# Patient Record
Sex: Male | Born: 1945 | Race: White | Hispanic: No | Smoking: Never smoker
Health system: Southern US, Community
[De-identification: ages and names within clinical notes are randomized; demographics above are authoritative.]

## PROBLEM LIST (undated history)

## (undated) DIAGNOSIS — Z9289 Personal history of other medical treatment: Secondary | ICD-10-CM

## (undated) DIAGNOSIS — E039 Hypothyroidism, unspecified: Secondary | ICD-10-CM

## (undated) DIAGNOSIS — E119 Type 2 diabetes mellitus without complications: Secondary | ICD-10-CM

## (undated) DIAGNOSIS — E7800 Pure hypercholesterolemia, unspecified: Secondary | ICD-10-CM

## (undated) DIAGNOSIS — I639 Cerebral infarction, unspecified: Secondary | ICD-10-CM

## (undated) DIAGNOSIS — I82409 Acute embolism and thrombosis of unspecified deep veins of unspecified lower extremity: Secondary | ICD-10-CM

## (undated) DIAGNOSIS — Z87448 Personal history of other diseases of urinary system: Secondary | ICD-10-CM

## (undated) DIAGNOSIS — E785 Hyperlipidemia, unspecified: Secondary | ICD-10-CM

## (undated) DIAGNOSIS — I10 Essential (primary) hypertension: Secondary | ICD-10-CM

## (undated) DIAGNOSIS — C801 Malignant (primary) neoplasm, unspecified: Secondary | ICD-10-CM

## (undated) DIAGNOSIS — Z8669 Personal history of other diseases of the nervous system and sense organs: Secondary | ICD-10-CM

## (undated) DIAGNOSIS — E079 Disorder of thyroid, unspecified: Secondary | ICD-10-CM

## (undated) DIAGNOSIS — G8929 Other chronic pain: Secondary | ICD-10-CM

## (undated) DIAGNOSIS — B20 Human immunodeficiency virus [HIV] disease: Secondary | ICD-10-CM

## (undated) DIAGNOSIS — A0472 Enterocolitis due to Clostridium difficile, not specified as recurrent: Secondary | ICD-10-CM

## (undated) DIAGNOSIS — Z973 Presence of spectacles and contact lenses: Secondary | ICD-10-CM

## (undated) DIAGNOSIS — K219 Gastro-esophageal reflux disease without esophagitis: Secondary | ICD-10-CM

## (undated) DIAGNOSIS — N189 Chronic kidney disease, unspecified: Secondary | ICD-10-CM

## (undated) DIAGNOSIS — Z21 Asymptomatic human immunodeficiency virus [HIV] infection status: Secondary | ICD-10-CM

## (undated) DIAGNOSIS — F419 Anxiety disorder, unspecified: Secondary | ICD-10-CM

## (undated) HISTORY — DX: Chronic kidney disease, unspecified: N18.9

## (undated) HISTORY — DX: Anxiety disorder, unspecified: F41.9

## (undated) HISTORY — PX: ABDOMINAL SURGERY: SHX537

## (undated) HISTORY — DX: Asymptomatic human immunodeficiency virus (hiv) infection status: Z21

## (undated) HISTORY — DX: Human immunodeficiency virus (HIV) disease: B20

## (undated) HISTORY — PX: HX CERVICAL SPINE SURGERY: 2100001197

## (undated) HISTORY — PX: COLON SURGERY: SHX602

## (undated) HISTORY — PX: KNEE SURGERY: SHX244

## (undated) HISTORY — PX: PANCREATECTOMY: SHX1019

## (undated) HISTORY — PX: WRIST SURGERY: SHX841

## (undated) HISTORY — DX: Gastro-esophageal reflux disease without esophagitis: K21.9

## (undated) HISTORY — PX: SPLENECTOMY: SUR1306

## (undated) HISTORY — PX: COLONOSCOPY: WVUENDOPRO10

## (undated) HISTORY — DX: Hypothyroidism, unspecified: E03.9

## (undated) HISTORY — PX: HX COLOSTOMY REVERSAL: 2100001134

## (undated) HISTORY — PX: HX LAPAROTOMY: SHX154

## (undated) HISTORY — DX: Essential (primary) hypertension: I10

## (undated) HISTORY — PX: GASTROSCOPY: WVUENDOPRO49

## (undated) HISTORY — DX: Pure hypercholesterolemia, unspecified: E78.00

## (undated) HISTORY — PX: HX HERNIA REPAIR: SHX51

---

## 1999-01-11 ENCOUNTER — Encounter: Admission: RE | Admit: 1999-01-11 | Discharge: 1999-01-11 | Payer: Self-pay | Admitting: Infectious Diseases

## 1999-01-11 ENCOUNTER — Encounter (INDEPENDENT_AMBULATORY_CARE_PROVIDER_SITE_OTHER): Payer: Self-pay | Admitting: *Deleted

## 1999-01-11 ENCOUNTER — Ambulatory Visit (HOSPITAL_COMMUNITY): Admission: RE | Admit: 1999-01-11 | Discharge: 1999-01-11 | Payer: Self-pay | Admitting: Infectious Diseases

## 1999-01-11 LAB — CONVERTED CEMR LAB
CD4 Count: 480 microliters
CD4 T Cell Abs: 480

## 1999-01-20 ENCOUNTER — Encounter: Admission: RE | Admit: 1999-01-20 | Discharge: 1999-01-20 | Payer: Self-pay | Admitting: Infectious Diseases

## 1999-02-01 HISTORY — PX: HX CERVICAL SPINE SURGERY: 2100001197

## 1999-02-17 ENCOUNTER — Ambulatory Visit (HOSPITAL_COMMUNITY): Admission: RE | Admit: 1999-02-17 | Discharge: 1999-02-17 | Payer: Self-pay | Admitting: Infectious Diseases

## 1999-02-17 ENCOUNTER — Encounter: Admission: RE | Admit: 1999-02-17 | Discharge: 1999-02-17 | Payer: Self-pay | Admitting: Infectious Diseases

## 1999-03-05 ENCOUNTER — Encounter: Admission: RE | Admit: 1999-03-05 | Discharge: 1999-03-05 | Payer: Self-pay | Admitting: Infectious Diseases

## 1999-05-27 ENCOUNTER — Encounter: Admission: RE | Admit: 1999-05-27 | Discharge: 1999-05-27 | Payer: Self-pay | Admitting: Infectious Diseases

## 1999-05-27 ENCOUNTER — Ambulatory Visit (HOSPITAL_COMMUNITY): Admission: RE | Admit: 1999-05-27 | Discharge: 1999-05-27 | Payer: Self-pay | Admitting: Infectious Diseases

## 1999-06-17 ENCOUNTER — Encounter: Admission: RE | Admit: 1999-06-17 | Discharge: 1999-06-17 | Payer: Self-pay | Admitting: Infectious Diseases

## 1999-11-22 ENCOUNTER — Encounter: Admission: RE | Admit: 1999-11-22 | Discharge: 1999-11-22 | Payer: Self-pay | Admitting: Internal Medicine

## 1999-11-22 ENCOUNTER — Ambulatory Visit (HOSPITAL_COMMUNITY): Admission: RE | Admit: 1999-11-22 | Discharge: 1999-11-22 | Payer: Self-pay | Admitting: Internal Medicine

## 1999-12-02 DIAGNOSIS — B20 Human immunodeficiency virus [HIV] disease: Secondary | ICD-10-CM

## 1999-12-09 ENCOUNTER — Encounter: Admission: RE | Admit: 1999-12-09 | Discharge: 1999-12-09 | Payer: Self-pay | Admitting: Infectious Diseases

## 2000-01-01 DIAGNOSIS — K5732 Diverticulitis of large intestine without perforation or abscess without bleeding: Secondary | ICD-10-CM | POA: Insufficient documentation

## 2000-07-06 ENCOUNTER — Ambulatory Visit (HOSPITAL_COMMUNITY): Admission: RE | Admit: 2000-07-06 | Discharge: 2000-07-06 | Payer: Self-pay | Admitting: Infectious Diseases

## 2000-07-06 ENCOUNTER — Encounter: Admission: RE | Admit: 2000-07-06 | Discharge: 2000-07-06 | Payer: Self-pay | Admitting: Infectious Diseases

## 2000-07-20 ENCOUNTER — Encounter: Admission: RE | Admit: 2000-07-20 | Discharge: 2000-07-20 | Payer: Self-pay | Admitting: Infectious Diseases

## 2000-12-11 ENCOUNTER — Encounter: Admission: RE | Admit: 2000-12-11 | Discharge: 2000-12-11 | Payer: Self-pay | Admitting: Vascular Surgery

## 2000-12-11 ENCOUNTER — Encounter: Payer: Self-pay | Admitting: Vascular Surgery

## 2001-01-18 ENCOUNTER — Encounter: Admission: RE | Admit: 2001-01-18 | Discharge: 2001-01-18 | Payer: Self-pay | Admitting: Infectious Diseases

## 2001-01-18 ENCOUNTER — Ambulatory Visit (HOSPITAL_COMMUNITY): Admission: RE | Admit: 2001-01-18 | Discharge: 2001-01-18 | Payer: Self-pay | Admitting: Infectious Diseases

## 2001-02-01 ENCOUNTER — Encounter: Admission: RE | Admit: 2001-02-01 | Discharge: 2001-02-01 | Payer: Self-pay | Admitting: Infectious Diseases

## 2001-02-19 ENCOUNTER — Encounter: Admission: RE | Admit: 2001-02-19 | Discharge: 2001-02-19 | Payer: Self-pay | Admitting: Infectious Diseases

## 2001-03-05 ENCOUNTER — Encounter: Admission: RE | Admit: 2001-03-05 | Discharge: 2001-03-05 | Payer: Self-pay | Admitting: Infectious Diseases

## 2001-06-26 ENCOUNTER — Ambulatory Visit (HOSPITAL_COMMUNITY): Admission: RE | Admit: 2001-06-26 | Discharge: 2001-06-26 | Payer: Self-pay | Admitting: Infectious Diseases

## 2001-06-26 ENCOUNTER — Encounter: Admission: RE | Admit: 2001-06-26 | Discharge: 2001-06-26 | Payer: Self-pay | Admitting: Internal Medicine

## 2001-07-12 ENCOUNTER — Encounter: Admission: RE | Admit: 2001-07-12 | Discharge: 2001-07-12 | Payer: Self-pay | Admitting: Infectious Diseases

## 2001-08-07 ENCOUNTER — Inpatient Hospital Stay (HOSPITAL_COMMUNITY): Admission: EM | Admit: 2001-08-07 | Discharge: 2001-08-09 | Payer: Self-pay | Admitting: Emergency Medicine

## 2001-11-22 ENCOUNTER — Encounter: Admission: RE | Admit: 2001-11-22 | Discharge: 2001-11-22 | Payer: Self-pay | Admitting: Infectious Diseases

## 2001-11-22 ENCOUNTER — Ambulatory Visit (HOSPITAL_COMMUNITY): Admission: RE | Admit: 2001-11-22 | Discharge: 2001-11-22 | Payer: Self-pay | Admitting: Infectious Diseases

## 2001-12-06 ENCOUNTER — Encounter: Admission: RE | Admit: 2001-12-06 | Discharge: 2001-12-06 | Payer: Self-pay | Admitting: Infectious Diseases

## 2002-06-13 ENCOUNTER — Encounter: Admission: RE | Admit: 2002-06-13 | Discharge: 2002-06-13 | Payer: Self-pay | Admitting: Infectious Diseases

## 2002-06-13 ENCOUNTER — Encounter: Payer: Self-pay | Admitting: Infectious Diseases

## 2002-06-17 ENCOUNTER — Encounter: Admission: RE | Admit: 2002-06-17 | Discharge: 2002-06-17 | Payer: Self-pay | Admitting: Infectious Diseases

## 2002-06-28 ENCOUNTER — Encounter: Payer: Self-pay | Admitting: Infectious Diseases

## 2002-06-28 ENCOUNTER — Ambulatory Visit (HOSPITAL_COMMUNITY): Admission: RE | Admit: 2002-06-28 | Discharge: 2002-06-28 | Payer: Self-pay | Admitting: Infectious Diseases

## 2002-06-28 ENCOUNTER — Encounter: Admission: RE | Admit: 2002-06-28 | Discharge: 2002-06-28 | Payer: Self-pay | Admitting: Infectious Diseases

## 2002-07-05 ENCOUNTER — Emergency Department (HOSPITAL_COMMUNITY): Admission: EM | Admit: 2002-07-05 | Discharge: 2002-07-06 | Payer: Self-pay | Admitting: Emergency Medicine

## 2002-07-15 ENCOUNTER — Encounter: Admission: RE | Admit: 2002-07-15 | Discharge: 2002-07-15 | Payer: Self-pay | Admitting: Infectious Diseases

## 2002-07-18 ENCOUNTER — Encounter: Admission: RE | Admit: 2002-07-18 | Discharge: 2002-07-18 | Payer: Self-pay | Admitting: Infectious Diseases

## 2002-08-06 ENCOUNTER — Encounter: Admission: RE | Admit: 2002-08-06 | Discharge: 2002-08-06 | Payer: Self-pay | Admitting: Infectious Diseases

## 2002-09-27 ENCOUNTER — Encounter: Admission: RE | Admit: 2002-09-27 | Discharge: 2002-09-27 | Payer: Self-pay | Admitting: Infectious Diseases

## 2002-11-08 ENCOUNTER — Encounter: Admission: RE | Admit: 2002-11-08 | Discharge: 2002-11-08 | Payer: Self-pay | Admitting: Infectious Diseases

## 2002-12-02 ENCOUNTER — Encounter: Admission: RE | Admit: 2002-12-02 | Discharge: 2002-12-02 | Payer: Self-pay | Admitting: Infectious Diseases

## 2002-12-02 ENCOUNTER — Ambulatory Visit (HOSPITAL_COMMUNITY): Admission: RE | Admit: 2002-12-02 | Discharge: 2002-12-02 | Payer: Self-pay | Admitting: Infectious Diseases

## 2002-12-02 ENCOUNTER — Encounter: Payer: Self-pay | Admitting: Infectious Diseases

## 2003-01-07 ENCOUNTER — Encounter: Admission: RE | Admit: 2003-01-07 | Discharge: 2003-01-07 | Payer: Self-pay | Admitting: Infectious Diseases

## 2003-01-07 ENCOUNTER — Encounter: Payer: Self-pay | Admitting: Infectious Diseases

## 2003-01-07 ENCOUNTER — Ambulatory Visit (HOSPITAL_COMMUNITY): Admission: RE | Admit: 2003-01-07 | Discharge: 2003-01-07 | Payer: Self-pay | Admitting: Infectious Diseases

## 2003-01-15 ENCOUNTER — Encounter: Admission: RE | Admit: 2003-01-15 | Discharge: 2003-01-15 | Payer: Self-pay | Admitting: Infectious Diseases

## 2003-02-07 ENCOUNTER — Encounter: Admission: RE | Admit: 2003-02-07 | Discharge: 2003-02-07 | Payer: Self-pay | Admitting: Infectious Diseases

## 2003-02-21 ENCOUNTER — Encounter: Admission: RE | Admit: 2003-02-21 | Discharge: 2003-02-21 | Payer: Self-pay | Admitting: Infectious Diseases

## 2003-04-10 ENCOUNTER — Ambulatory Visit (HOSPITAL_COMMUNITY): Admission: RE | Admit: 2003-04-10 | Discharge: 2003-04-10 | Payer: Self-pay | Admitting: Infectious Diseases

## 2003-04-10 ENCOUNTER — Encounter: Admission: RE | Admit: 2003-04-10 | Discharge: 2003-04-10 | Payer: Self-pay | Admitting: Infectious Diseases

## 2003-04-24 ENCOUNTER — Encounter: Admission: RE | Admit: 2003-04-24 | Discharge: 2003-04-24 | Payer: Self-pay | Admitting: Infectious Diseases

## 2003-04-30 ENCOUNTER — Ambulatory Visit (HOSPITAL_COMMUNITY): Admission: RE | Admit: 2003-04-30 | Discharge: 2003-04-30 | Payer: Self-pay | Admitting: Infectious Diseases

## 2003-05-27 ENCOUNTER — Encounter: Admission: RE | Admit: 2003-05-27 | Discharge: 2003-05-27 | Payer: Self-pay | Admitting: Infectious Diseases

## 2003-06-05 ENCOUNTER — Ambulatory Visit (HOSPITAL_COMMUNITY): Admission: RE | Admit: 2003-06-05 | Discharge: 2003-06-05 | Payer: Self-pay | Admitting: Infectious Diseases

## 2003-06-05 ENCOUNTER — Encounter: Admission: RE | Admit: 2003-06-05 | Discharge: 2003-06-05 | Payer: Self-pay | Admitting: Infectious Diseases

## 2003-06-19 ENCOUNTER — Encounter: Admission: RE | Admit: 2003-06-19 | Discharge: 2003-06-19 | Payer: Self-pay | Admitting: Infectious Diseases

## 2003-06-27 ENCOUNTER — Encounter: Admission: RE | Admit: 2003-06-27 | Discharge: 2003-06-27 | Payer: Self-pay | Admitting: Infectious Diseases

## 2003-06-27 ENCOUNTER — Ambulatory Visit (HOSPITAL_COMMUNITY): Admission: RE | Admit: 2003-06-27 | Discharge: 2003-06-27 | Payer: Self-pay | Admitting: Infectious Diseases

## 2003-08-12 ENCOUNTER — Ambulatory Visit (HOSPITAL_COMMUNITY): Admission: RE | Admit: 2003-08-12 | Discharge: 2003-08-12 | Payer: Self-pay | Admitting: Infectious Diseases

## 2003-08-12 ENCOUNTER — Encounter: Admission: RE | Admit: 2003-08-12 | Discharge: 2003-08-12 | Payer: Self-pay | Admitting: Infectious Diseases

## 2003-08-28 ENCOUNTER — Encounter: Admission: RE | Admit: 2003-08-28 | Discharge: 2003-08-28 | Payer: Self-pay | Admitting: Infectious Diseases

## 2003-09-01 DIAGNOSIS — F329 Major depressive disorder, single episode, unspecified: Secondary | ICD-10-CM

## 2003-09-24 ENCOUNTER — Encounter: Admission: RE | Admit: 2003-09-24 | Discharge: 2003-09-24 | Payer: Self-pay | Admitting: Infectious Diseases

## 2003-10-13 ENCOUNTER — Ambulatory Visit: Payer: Self-pay | Admitting: Infectious Diseases

## 2003-10-13 ENCOUNTER — Ambulatory Visit (HOSPITAL_COMMUNITY): Admission: RE | Admit: 2003-10-13 | Discharge: 2003-10-13 | Payer: Self-pay | Admitting: Infectious Diseases

## 2003-10-31 ENCOUNTER — Ambulatory Visit: Payer: Self-pay | Admitting: Infectious Diseases

## 2003-11-17 ENCOUNTER — Ambulatory Visit: Payer: Self-pay | Admitting: Infectious Diseases

## 2003-11-17 ENCOUNTER — Ambulatory Visit (HOSPITAL_COMMUNITY): Admission: RE | Admit: 2003-11-17 | Discharge: 2003-11-17 | Payer: Self-pay | Admitting: Infectious Diseases

## 2003-11-26 ENCOUNTER — Ambulatory Visit: Payer: Self-pay | Admitting: Infectious Diseases

## 2003-12-01 ENCOUNTER — Ambulatory Visit: Payer: Self-pay | Admitting: Infectious Diseases

## 2003-12-01 ENCOUNTER — Ambulatory Visit (HOSPITAL_COMMUNITY): Admission: RE | Admit: 2003-12-01 | Discharge: 2003-12-01 | Payer: Self-pay | Admitting: Infectious Diseases

## 2003-12-19 ENCOUNTER — Ambulatory Visit: Payer: Self-pay | Admitting: Infectious Diseases

## 2004-01-12 ENCOUNTER — Ambulatory Visit: Payer: Self-pay | Admitting: Infectious Diseases

## 2004-01-14 ENCOUNTER — Emergency Department (HOSPITAL_COMMUNITY): Admission: EM | Admit: 2004-01-14 | Discharge: 2004-01-14 | Payer: Self-pay | Admitting: Emergency Medicine

## 2004-01-23 ENCOUNTER — Ambulatory Visit: Payer: Self-pay | Admitting: Infectious Diseases

## 2004-03-20 ENCOUNTER — Ambulatory Visit: Payer: Self-pay | Admitting: Internal Medicine

## 2004-03-20 ENCOUNTER — Inpatient Hospital Stay (HOSPITAL_COMMUNITY): Admission: AD | Admit: 2004-03-20 | Discharge: 2004-04-17 | Payer: Self-pay | Admitting: Pulmonary Disease

## 2004-03-20 ENCOUNTER — Ambulatory Visit: Payer: Self-pay | Admitting: Pulmonary Disease

## 2004-03-31 ENCOUNTER — Ambulatory Visit: Payer: Self-pay | Admitting: Internal Medicine

## 2004-04-06 ENCOUNTER — Ambulatory Visit: Payer: Self-pay | Admitting: Infectious Diseases

## 2004-05-02 ENCOUNTER — Inpatient Hospital Stay (HOSPITAL_COMMUNITY): Admission: EM | Admit: 2004-05-02 | Discharge: 2004-05-10 | Payer: Self-pay | Admitting: Emergency Medicine

## 2004-05-02 ENCOUNTER — Ambulatory Visit: Payer: Self-pay | Admitting: Internal Medicine

## 2004-05-12 ENCOUNTER — Ambulatory Visit: Payer: Self-pay | Admitting: Infectious Diseases

## 2004-06-24 ENCOUNTER — Ambulatory Visit: Payer: Self-pay | Admitting: Infectious Diseases

## 2004-06-24 ENCOUNTER — Ambulatory Visit (HOSPITAL_COMMUNITY): Admission: RE | Admit: 2004-06-24 | Discharge: 2004-06-24 | Payer: Self-pay | Admitting: Infectious Diseases

## 2004-09-13 ENCOUNTER — Ambulatory Visit: Payer: Self-pay | Admitting: Infectious Diseases

## 2004-09-30 ENCOUNTER — Ambulatory Visit: Payer: Self-pay | Admitting: Infectious Diseases

## 2004-09-30 ENCOUNTER — Ambulatory Visit (HOSPITAL_COMMUNITY): Admission: RE | Admit: 2004-09-30 | Discharge: 2004-09-30 | Payer: Self-pay | Admitting: Infectious Diseases

## 2004-10-21 ENCOUNTER — Ambulatory Visit: Payer: Self-pay | Admitting: Infectious Diseases

## 2004-11-12 ENCOUNTER — Ambulatory Visit: Payer: Self-pay | Admitting: Infectious Diseases

## 2004-11-19 ENCOUNTER — Ambulatory Visit: Payer: Self-pay | Admitting: Internal Medicine

## 2004-12-03 ENCOUNTER — Ambulatory Visit: Payer: Self-pay | Admitting: Infectious Diseases

## 2004-12-17 ENCOUNTER — Ambulatory Visit: Payer: Self-pay | Admitting: Infectious Diseases

## 2004-12-31 ENCOUNTER — Ambulatory Visit: Payer: Self-pay | Admitting: Infectious Diseases

## 2005-01-11 ENCOUNTER — Ambulatory Visit (HOSPITAL_COMMUNITY): Admission: RE | Admit: 2005-01-11 | Discharge: 2005-01-11 | Payer: Self-pay | Admitting: Infectious Diseases

## 2005-01-11 ENCOUNTER — Ambulatory Visit: Payer: Self-pay | Admitting: Infectious Diseases

## 2005-01-19 ENCOUNTER — Ambulatory Visit: Payer: Self-pay | Admitting: Infectious Diseases

## 2005-03-04 ENCOUNTER — Encounter (INDEPENDENT_AMBULATORY_CARE_PROVIDER_SITE_OTHER): Payer: Self-pay | Admitting: *Deleted

## 2005-03-04 ENCOUNTER — Ambulatory Visit: Payer: Self-pay | Admitting: Infectious Diseases

## 2005-03-04 LAB — CONVERTED CEMR LAB: CD4 Count: 750 microliters

## 2005-03-18 ENCOUNTER — Ambulatory Visit: Payer: Self-pay | Admitting: Infectious Diseases

## 2005-03-24 ENCOUNTER — Ambulatory Visit: Payer: Self-pay | Admitting: Infectious Diseases

## 2005-04-07 ENCOUNTER — Ambulatory Visit: Payer: Self-pay | Admitting: Infectious Diseases

## 2005-04-15 ENCOUNTER — Ambulatory Visit: Payer: Self-pay | Admitting: Infectious Diseases

## 2005-04-25 ENCOUNTER — Encounter: Admission: RE | Admit: 2005-04-25 | Discharge: 2005-04-25 | Payer: Self-pay | Admitting: Infectious Diseases

## 2005-04-25 ENCOUNTER — Encounter (INDEPENDENT_AMBULATORY_CARE_PROVIDER_SITE_OTHER): Payer: Self-pay | Admitting: *Deleted

## 2005-04-25 ENCOUNTER — Ambulatory Visit: Payer: Self-pay | Admitting: Infectious Diseases

## 2005-04-26 ENCOUNTER — Encounter: Payer: Self-pay | Admitting: Infectious Diseases

## 2005-05-05 ENCOUNTER — Ambulatory Visit: Payer: Self-pay | Admitting: Infectious Diseases

## 2006-02-14 DIAGNOSIS — D696 Thrombocytopenia, unspecified: Secondary | ICD-10-CM

## 2006-03-27 ENCOUNTER — Encounter (INDEPENDENT_AMBULATORY_CARE_PROVIDER_SITE_OTHER): Payer: Self-pay | Admitting: *Deleted

## 2006-03-27 LAB — CONVERTED CEMR LAB

## 2006-04-09 ENCOUNTER — Encounter (INDEPENDENT_AMBULATORY_CARE_PROVIDER_SITE_OTHER): Payer: Self-pay | Admitting: *Deleted

## 2006-07-09 IMAGING — CR DG CHEST 1V PORT
1 series · 1 of 1 positions shown · non-contrast
Comparison: 03/21/04.

CLINICAL DATA: ARDS.
 PORTABLE CHEST:

[view not recorded]
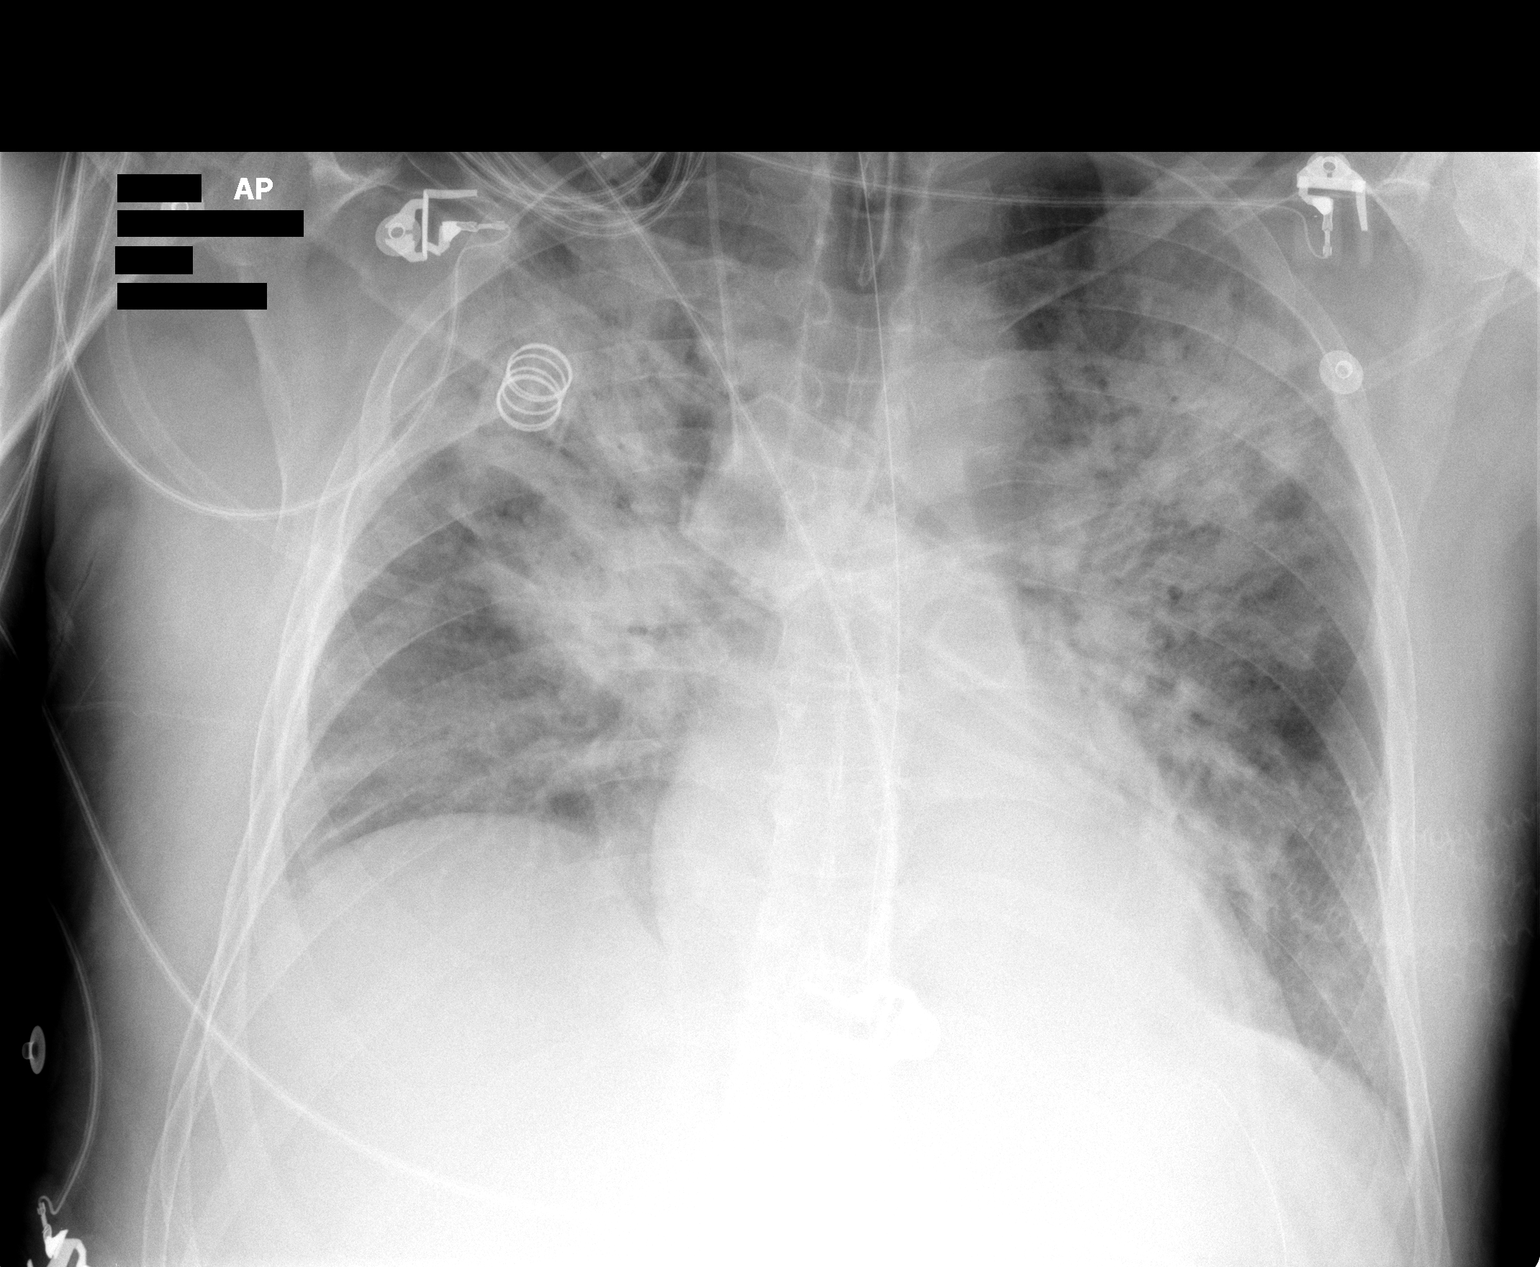

[1 of 1 positions shown; findings below may reference images not displayed]

Frontal chest at 0747 hours shows no interval change in the bilateral air space disease.  Heart size is stable.  Endotracheal tube, right IJ central line, nasogastric tube remain in place.
IMPRESSION: Stable exam.

## 2006-07-11 IMAGING — CR DG CHEST 1V PORT
1 series · 1 of 1 positions shown · non-contrast
Comparison: 03/23/04.

CLINICAL DATA: Follow-up ARDS.
 PORTABLE CHEST ONE VIEW AT 0980 HOURS:

[view not recorded]
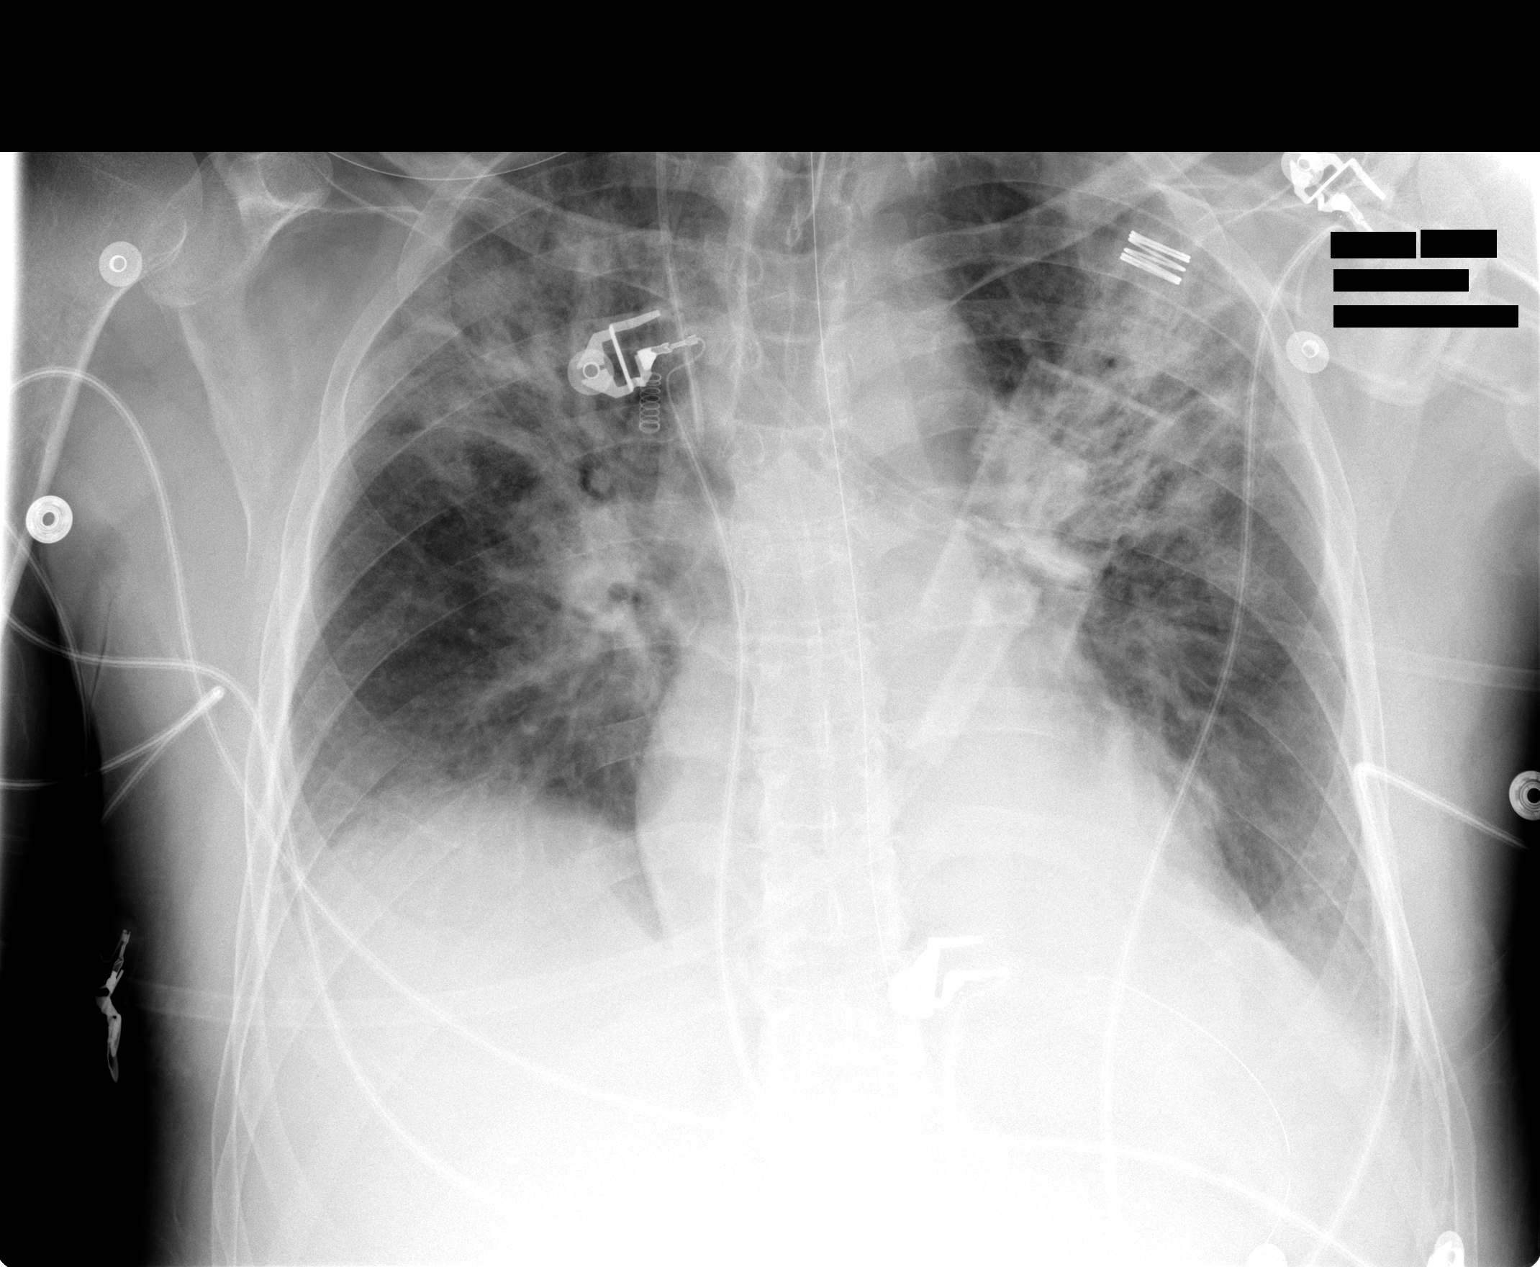

[1 of 1 positions shown; findings below may reference images not displayed]

There is bilateral air space disease with mild improvement in aeration intervally.  There is significant residual air space disease seen within the upper lobes and left lower lobe.  Endotracheal tube is unchanged in position.  There is no pneumothorax.
IMPRESSION: Interval improvement in bilateral air space disease as discussed above.  Residual significant air space disease noted within the upper lobes and left lower lobe.

## 2006-08-01 IMAGING — CT CT ABDOMEN W/ CM
1 of 2 series · 14 of 32 positions shown, 18 images · IV contrast (omnipaque)
Comparison: 04/30/03.

CLINICAL DATA: Patient with multiple abdominal surgeries including colostomy formation for perforated colon.  Hernia repair with mesh.  Patient with colostomy.
TECHNIQUE: Contiguous axial CT images were taken through the abdomen and pelvis after the injection of 50 cc of Omnipaque 300 contrast material.  Please note that contrast had to be injected by hand through a PICC line.  This somewhat limits our ability to evaluate solid abdominal viscera.

[Series 2: abd pelvis · axial · 0.70mm/px · z∈[-487,-52]mm · 14 of 97 slices shown, 18 images]
[im 5/97  soft-tissue]
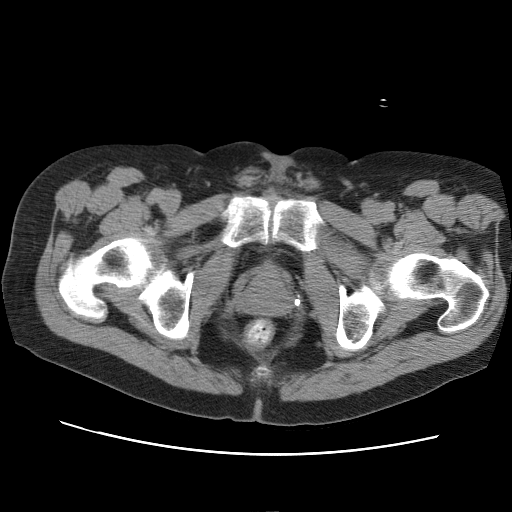
[im 5/97  bone]
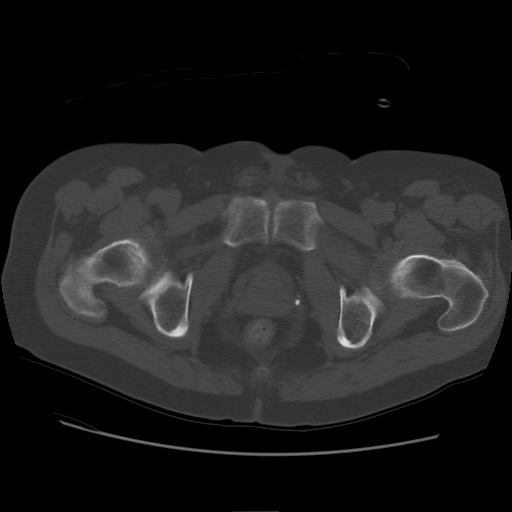
[im 13/97  soft-tissue]
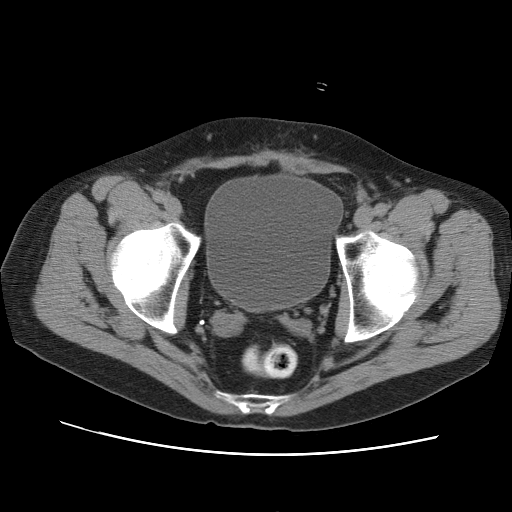
[im 21/97  soft-tissue]
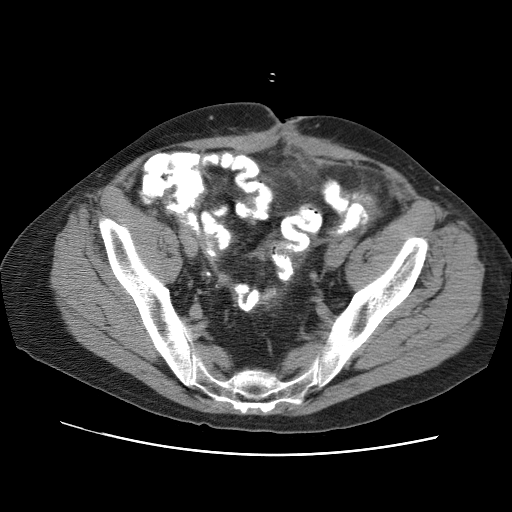
[im 30/97  soft-tissue]
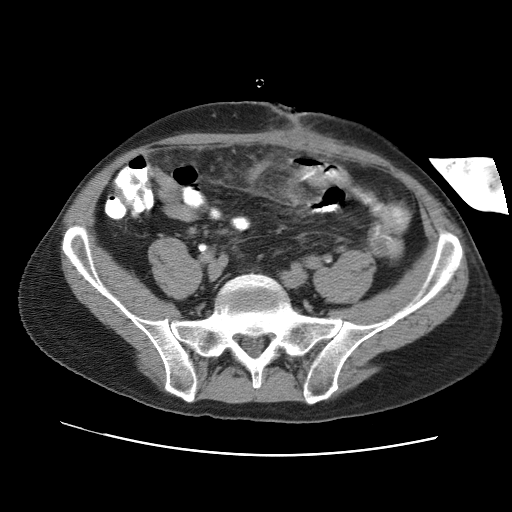
[im 38/97  soft-tissue]
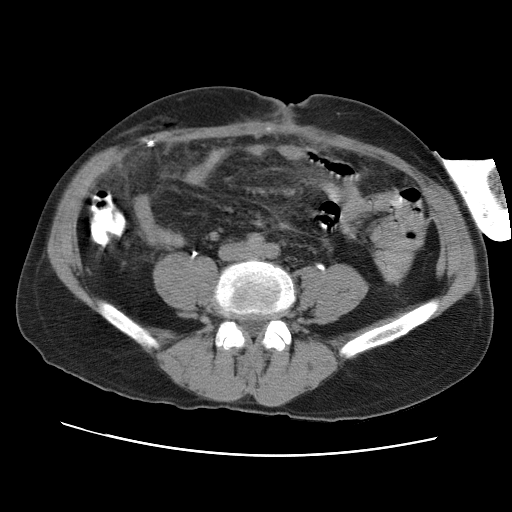
[im 46/97  soft-tissue]
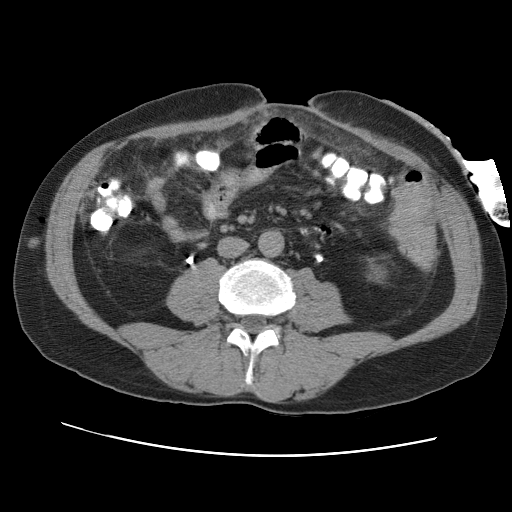
[im 51/97  soft-tissue]
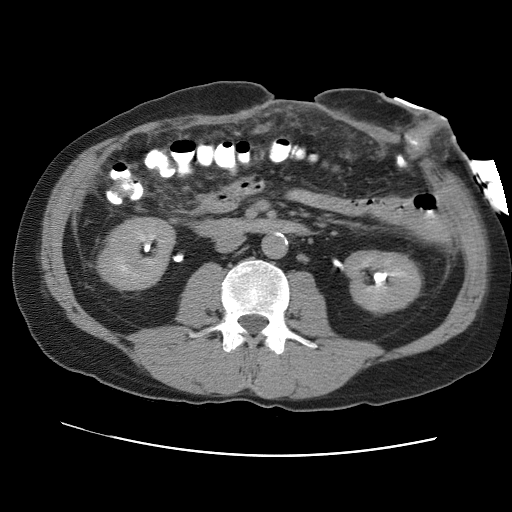
[im 59/97  soft-tissue]
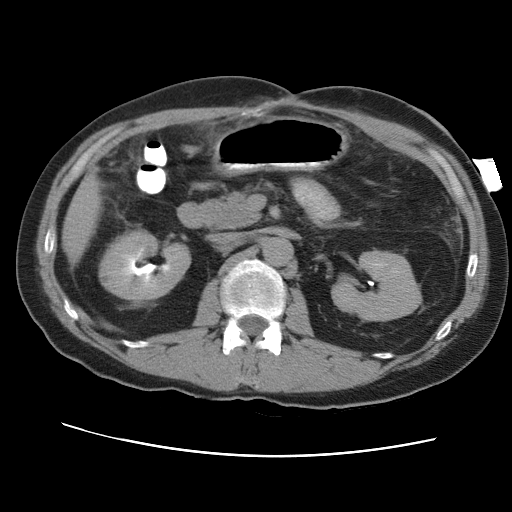
[im 67/97  soft-tissue]
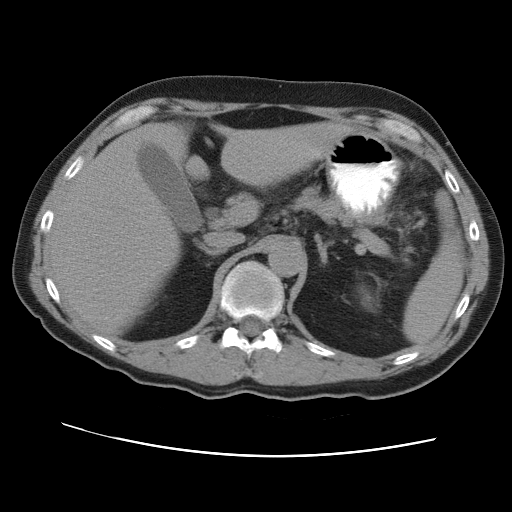
[im 67/97  bone]
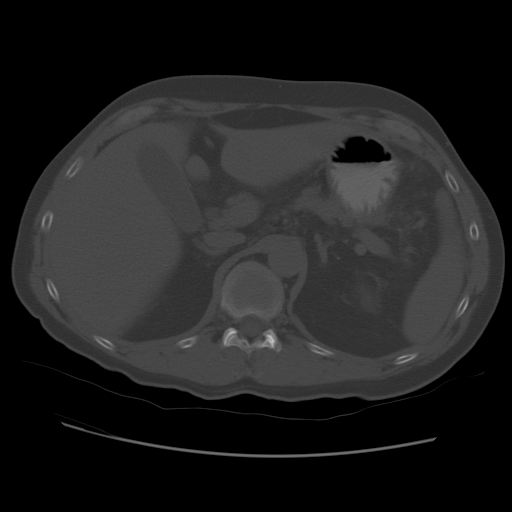
[im 76/97  soft-tissue]
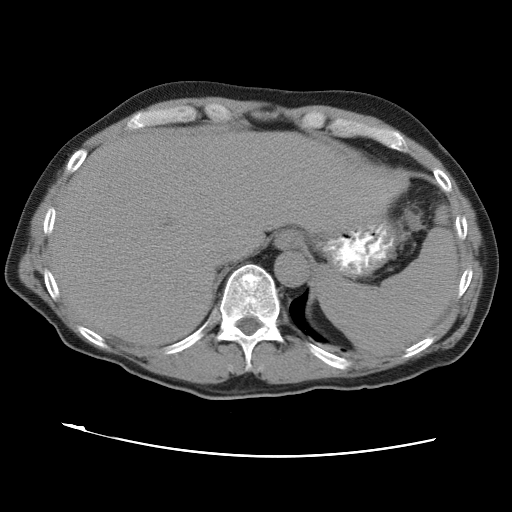
[im 80/97  lung]
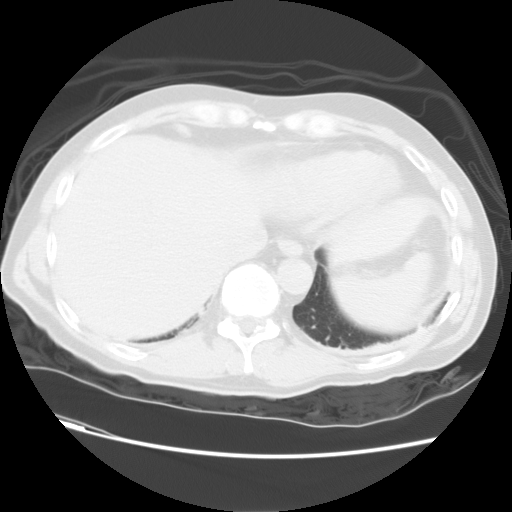
[im 84/97  soft-tissue]
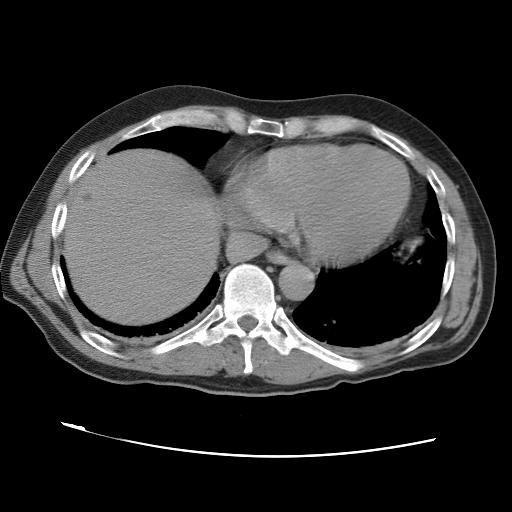
[im 84/97  lung]
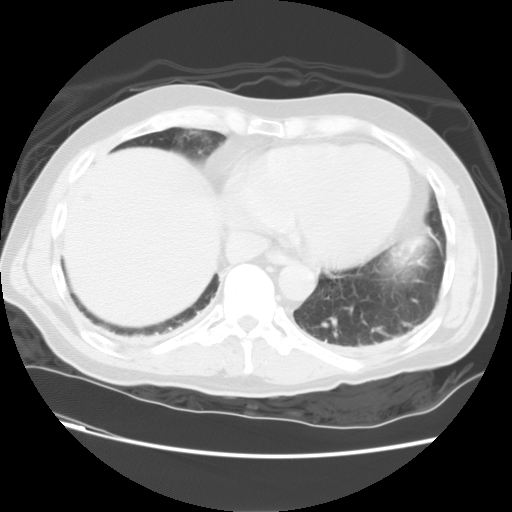
[im 88/97  lung]
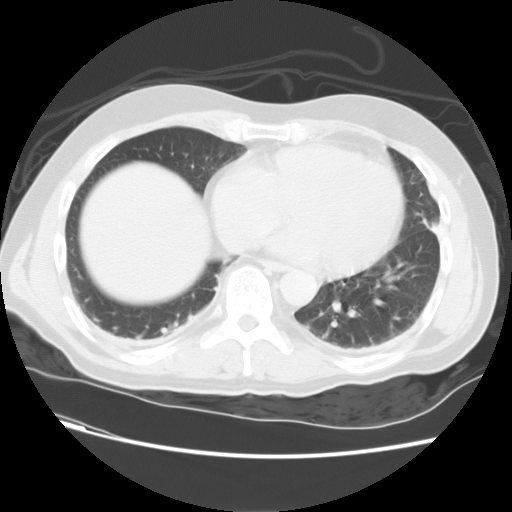
[im 92/97  soft-tissue]
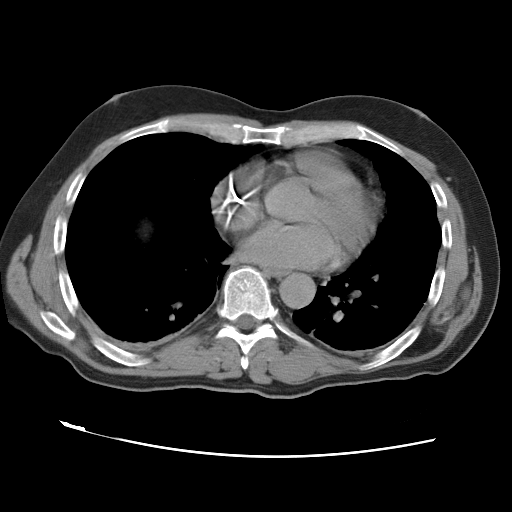
[im 92/97  lung]
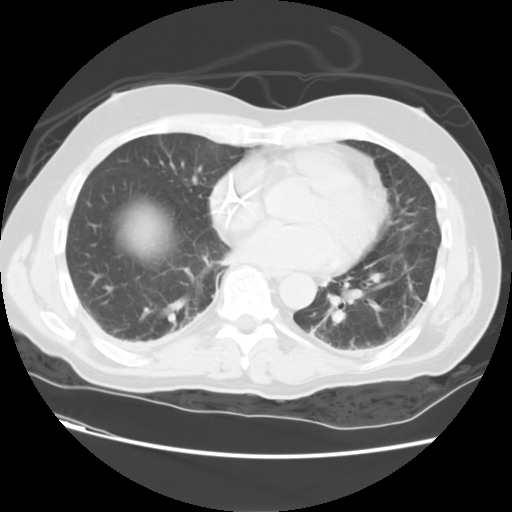

[14 of 32 positions shown; findings below may reference images not displayed]

ABDOMEN CT SCAN WITH CONTRAST:
 There is some dependent bibasilar atelectasis with some mild ground glass anteriorly in the right lower lobe which may also represent some atelectasis.  No pleural or pericardial effusion.  A single hypoattenuating lesion is seen in the dome of the liver on image 13 which is too small to characterize but may represent a cyst.  A second too small to characterize hypoattenuating lesion in the lateral segment of the left lobe of the liver on image [DATE] also represent a cyst.  Gallbladder appears normal.  Spleen, adrenal glands, and kidneys appear normal.  Again seen is a low attenuating lesion in the tail of the pancreas which measures 2.3 x 2.5 cm.  This does not appear markedly changed in comparison to the prior study but cannot be definitively characterized on this examination.  The pancreas is otherwise unremarkable.  There is stranding in the mesentery diffusely.  Colostomy is noted on the left.  No evidence of bowel obstruction.  No abscess.
IMPRESSION: 1.  Low attenuating lesion in the tail of the pancreas which does not appear markedly changed.  It cannot be definitively characterized on this study.  If indicated, MRI could be used for further evaluation.
 2.  Two nonspecific low attenuating lesions in the liver which may represent cysts. 
 3.  Left-sided colostomy.
 CT PELVIS WITH CONTRAST:
 Anastomosis is seen in the left lower quadrant of the abdomen.  No evidence of bowel obstruction.  Just superior to the anastomosis and the mesenteric fat, there is a focal area of infiltration which measures approximately 3.2 x 2.9 cm.  This may represent some phlegmon formation.  Otherwise, mesentery just demonstrates some infiltration without focal abnormality.  No focal fluid collection to suggest abscess is identified.  No adenopathy.  Note is made of air within the urinary bladder, possibly from prior Foley catheter placement.
IMPRESSION: 1.  Rounded, somewhat focal area of infiltration of the mesenteric fat in the pelvis best seen on image 65.  This may represent early phlegmon formation.  No abscess is identified at this time. 
 2.  Bowel anastomosis without evidence of obstruction.

## 2007-12-05 ENCOUNTER — Encounter: Payer: Self-pay | Admitting: Infectious Diseases

## 2007-12-06 ENCOUNTER — Ambulatory Visit: Payer: Self-pay | Admitting: Infectious Diseases

## 2007-12-06 LAB — CONVERTED CEMR LAB
AST: 17 units/L (ref 0–37)
Basophils Absolute: 0 10*3/uL (ref 0.0–0.1)
Basophils Relative: 0 % (ref 0–1)
Bilirubin Urine: NEGATIVE
Calcium: 9.7 mg/dL (ref 8.4–10.5)
Chlamydia, Swab/Urine, PCR: NEGATIVE
Chloride: 104 meq/L (ref 96–112)
Creatinine, Ser: 1.27 mg/dL (ref 0.40–1.50)
Eosinophils Absolute: 0.1 10*3/uL (ref 0.0–0.7)
Eosinophils Relative: 3 % (ref 0–5)
GC Probe Amp, Urine: NEGATIVE
HIV-1 RNA Quant, Log: <1.68 (ref <1.68)
Ketones, ur: NEGATIVE mg/dL
Lymphocytes Relative: 29 % (ref 12–46)
MCV: 92.8 fL (ref 78.0–100.0)
Monocytes Absolute: 0.4 10*3/uL (ref 0.1–1.0)
Nitrite: NEGATIVE
RBC: 4.89 M/uL (ref 4.22–5.81)
RDW: 13.6 % (ref 11.5–15.5)
Total CHOL/HDL Ratio: 8.5
Total Protein: 7.3 g/dL (ref 6.0–8.3)
Urine Glucose: NEGATIVE mg/dL
WBC: 5.7 10*3/uL (ref 4.0–10.5)

## 2007-12-21 ENCOUNTER — Ambulatory Visit: Payer: Self-pay | Admitting: Infectious Diseases

## 2007-12-21 LAB — CONVERTED CEMR LAB
Cholesterol: 240 mg/dL — ABNORMAL HIGH (ref 0–200)
HDL: 32 mg/dL — ABNORMAL LOW (ref >39)
LDL Cholesterol: 140 mg/dL — ABNORMAL HIGH (ref 0–99)
Total CHOL/HDL Ratio: 7.5
Triglycerides: 341 mg/dL — ABNORMAL HIGH (ref <150)
VLDL: 68 mg/dL — ABNORMAL HIGH (ref 0–40)

## 2008-02-28 ENCOUNTER — Telehealth: Payer: Self-pay | Admitting: Infectious Diseases

## 2008-04-09 ENCOUNTER — Telehealth (INDEPENDENT_AMBULATORY_CARE_PROVIDER_SITE_OTHER): Payer: Self-pay | Admitting: *Deleted

## 2008-04-11 ENCOUNTER — Ambulatory Visit (HOSPITAL_COMMUNITY): Admission: RE | Admit: 2008-04-11 | Discharge: 2008-04-11 | Payer: Self-pay | Admitting: Internal Medicine

## 2008-04-11 ENCOUNTER — Encounter: Payer: Self-pay | Admitting: Infectious Diseases

## 2008-04-11 ENCOUNTER — Ambulatory Visit: Payer: Self-pay | Admitting: Internal Medicine

## 2008-04-11 DIAGNOSIS — E785 Hyperlipidemia, unspecified: Secondary | ICD-10-CM | POA: Insufficient documentation

## 2008-04-11 DIAGNOSIS — R42 Dizziness and giddiness: Secondary | ICD-10-CM

## 2008-04-23 ENCOUNTER — Ambulatory Visit: Payer: Self-pay | Admitting: Cardiology

## 2008-04-23 ENCOUNTER — Encounter: Payer: Self-pay | Admitting: Internal Medicine

## 2008-04-23 ENCOUNTER — Ambulatory Visit (HOSPITAL_COMMUNITY): Admission: RE | Admit: 2008-04-23 | Discharge: 2008-04-23 | Payer: Self-pay | Admitting: Internal Medicine

## 2008-05-16 ENCOUNTER — Telehealth: Payer: Self-pay | Admitting: Infectious Diseases

## 2008-06-02 ENCOUNTER — Ambulatory Visit: Payer: Self-pay | Admitting: Infectious Diseases

## 2008-06-02 LAB — CONVERTED CEMR LAB
AST: 23 units/L (ref 0–37)
Alkaline Phosphatase: 114 units/L (ref 39–117)
BUN: 21 mg/dL (ref 6–23)
CO2: 21 meq/L (ref 19–32)
Calcium: 9.2 mg/dL (ref 8.4–10.5)
Eosinophils Relative: 4 % (ref 0–5)
GFR calc non Af Amer: 52 mL/min — ABNORMAL LOW (ref >60)
Glucose, Bld: 105 mg/dL — ABNORMAL HIGH (ref 70–99)
HCT: 43.6 % (ref 39.0–52.0)
HIV 1 RNA Quant: <48 copies/mL (ref <48)
Lymphocytes Relative: 30 % (ref 12–46)
Lymphs Abs: 2.1 10*3/uL (ref 0.7–4.0)
MCHC: 34.2 g/dL (ref 30.0–36.0)
Monocytes Absolute: 0.6 10*3/uL (ref 0.1–1.0)
Neutrophils Relative %: 57 % (ref 43–77)
Total Bilirubin: 0.4 mg/dL (ref 0.3–1.2)
Total Protein: 6.9 g/dL (ref 6.0–8.3)

## 2008-06-19 ENCOUNTER — Ambulatory Visit: Payer: Self-pay | Admitting: Infectious Diseases

## 2008-06-19 LAB — CONVERTED CEMR LAB
Cholesterol: 182 mg/dL (ref 0–200)
HDL: 33 mg/dL — ABNORMAL LOW (ref >39)
Triglycerides: 181 mg/dL — ABNORMAL HIGH (ref <150)

## 2008-06-20 ENCOUNTER — Telehealth (INDEPENDENT_AMBULATORY_CARE_PROVIDER_SITE_OTHER): Payer: Self-pay | Admitting: *Deleted

## 2008-06-20 ENCOUNTER — Telehealth: Payer: Self-pay | Admitting: Infectious Diseases

## 2008-10-15 ENCOUNTER — Ambulatory Visit: Payer: Self-pay | Admitting: Infectious Diseases

## 2008-10-15 LAB — CONVERTED CEMR LAB
ALT: 18 units/L (ref 0–53)
Alkaline Phosphatase: 91 units/L (ref 39–117)
BUN: 20 mg/dL (ref 6–23)
Basophils Absolute: 0 10*3/uL (ref 0.0–0.1)
Basophils Relative: 1 % (ref 0–1)
Calcium: 9.6 mg/dL (ref 8.4–10.5)
Cholesterol: 168 mg/dL (ref 0–200)
Eosinophils Relative: 4 % (ref 0–5)
Glucose, Bld: 117 mg/dL — ABNORMAL HIGH (ref 70–99)
HCT: 41.3 % (ref 39.0–52.0)
HDL: 29 mg/dL — ABNORMAL LOW (ref >39)
Hemoglobin: 14.3 g/dL (ref 13.0–17.0)
Monocytes Absolute: 0.5 10*3/uL (ref 0.1–1.0)
Neutro Abs: 3.2 10*3/uL (ref 1.7–7.7)
Neutrophils Relative %: 58 % (ref 43–77)
Platelets: 168 10*3/uL (ref 150–400)
Potassium: 4.2 meq/L (ref 3.5–5.3)
RBC: 4.46 M/uL (ref 4.22–5.81)
Total Bilirubin: 0.3 mg/dL (ref 0.3–1.2)
Total CHOL/HDL Ratio: 5.8
Total Protein: 6.7 g/dL (ref 6.0–8.3)
VLDL: 77 mg/dL — ABNORMAL HIGH (ref 0–40)

## 2008-10-22 ENCOUNTER — Ambulatory Visit: Payer: Self-pay | Admitting: Internal Medicine

## 2008-10-22 DIAGNOSIS — B356 Tinea cruris: Secondary | ICD-10-CM

## 2008-10-30 ENCOUNTER — Ambulatory Visit: Payer: Self-pay | Admitting: Infectious Diseases

## 2008-12-08 ENCOUNTER — Encounter: Payer: Self-pay | Admitting: Infectious Diseases

## 2008-12-08 ENCOUNTER — Telehealth (INDEPENDENT_AMBULATORY_CARE_PROVIDER_SITE_OTHER): Payer: Self-pay | Admitting: *Deleted

## 2008-12-23 ENCOUNTER — Telehealth: Payer: Self-pay | Admitting: Infectious Diseases

## 2008-12-23 ENCOUNTER — Telehealth (INDEPENDENT_AMBULATORY_CARE_PROVIDER_SITE_OTHER): Payer: Self-pay | Admitting: *Deleted

## 2009-02-11 ENCOUNTER — Encounter: Payer: Self-pay | Admitting: Infectious Diseases

## 2009-02-23 ENCOUNTER — Telehealth: Payer: Self-pay | Admitting: Infectious Diseases

## 2009-02-24 ENCOUNTER — Encounter: Payer: Self-pay | Admitting: Infectious Diseases

## 2009-02-24 DIAGNOSIS — G43909 Migraine, unspecified, not intractable, without status migrainosus: Secondary | ICD-10-CM | POA: Insufficient documentation

## 2009-03-17 ENCOUNTER — Encounter: Payer: Self-pay | Admitting: Infectious Disease

## 2009-03-24 ENCOUNTER — Ambulatory Visit: Payer: Self-pay | Admitting: Infectious Diseases

## 2009-03-24 LAB — CONVERTED CEMR LAB
ALT: 24 units/L (ref 0–53)
AST: 18 units/L (ref 0–37)
BUN: 17 mg/dL (ref 6–23)
Calcium: 9.5 mg/dL (ref 8.4–10.5)
Chloride: 103 meq/L (ref 96–112)
Eosinophils Absolute: 0.2 10*3/uL (ref 0.0–0.7)
Glucose, Bld: 112 mg/dL — ABNORMAL HIGH (ref 70–99)
HIV 1 RNA Quant: <48 copies/mL (ref <48)
Monocytes Absolute: 0.7 10*3/uL (ref 0.1–1.0)
Monocytes Relative: 11 % (ref 3–12)
Neutro Abs: 3.4 10*3/uL (ref 1.7–7.7)
RBC: 4.67 M/uL (ref 4.22–5.81)
Total Bilirubin: 0.4 mg/dL (ref 0.3–1.2)
Total Protein: 6.9 g/dL (ref 6.0–8.3)
WBC: 6.4 10*3/uL (ref 4.0–10.5)

## 2009-04-02 ENCOUNTER — Ambulatory Visit: Payer: Self-pay | Admitting: Infectious Diseases

## 2009-06-16 ENCOUNTER — Telehealth: Payer: Self-pay | Admitting: Infectious Diseases

## 2009-09-09 ENCOUNTER — Ambulatory Visit: Payer: Self-pay | Admitting: Infectious Diseases

## 2009-09-09 LAB — CONVERTED CEMR LAB
ALT: 26 units/L (ref 0–53)
Albumin: 4.4 g/dL (ref 3.5–5.2)
Basophils Absolute: 0 10*3/uL (ref 0.0–0.1)
Chloride: 106 meq/L (ref 96–112)
Creatinine, Ser: 1.49 mg/dL (ref 0.40–1.50)
Eosinophils Absolute: 0.2 10*3/uL (ref 0.0–0.7)
HCT: 44.7 % (ref 39.0–52.0)
HDL: 28 mg/dL — ABNORMAL LOW (ref >39)
HIV 1 RNA Quant: <48 copies/mL (ref <48)
Hemoglobin: 14.9 g/dL (ref 13.0–17.0)
MCV: 93.5 fL (ref 78.0–100.0)
Monocytes Relative: 9 % (ref 3–12)
Neutrophils Relative %: 53 % (ref 43–77)
Platelets: 181 10*3/uL (ref 150–400)
Potassium: 4.2 meq/L (ref 3.5–5.3)
RDW: 13.1 % (ref 11.5–15.5)
Sodium: 142 meq/L (ref 135–145)
Total Bilirubin: 0.5 mg/dL (ref 0.3–1.2)
Total Protein: 6.7 g/dL (ref 6.0–8.3)
Triglycerides: 341 mg/dL — ABNORMAL HIGH (ref <150)
VLDL: 68 mg/dL — ABNORMAL HIGH (ref 0–40)

## 2009-09-24 ENCOUNTER — Ambulatory Visit: Payer: Self-pay | Admitting: Infectious Diseases

## 2009-11-06 ENCOUNTER — Telehealth (INDEPENDENT_AMBULATORY_CARE_PROVIDER_SITE_OTHER): Payer: Self-pay | Admitting: Licensed Clinical Social Worker

## 2009-11-20 ENCOUNTER — Telehealth: Payer: Self-pay | Admitting: Infectious Diseases

## 2010-02-08 ENCOUNTER — Ambulatory Visit
Admission: RE | Admit: 2010-02-08 | Discharge: 2010-02-08 | Payer: Self-pay | Source: Home / Self Care | Attending: Infectious Diseases | Admitting: Infectious Diseases

## 2010-02-08 ENCOUNTER — Encounter: Payer: Self-pay | Admitting: Infectious Diseases

## 2010-02-08 LAB — CONVERTED CEMR LAB
AST: 20 units/L (ref 0–37)
Albumin: 4.5 g/dL (ref 3.5–5.2)
BUN: 17 mg/dL (ref 6–23)
CO2: 25 meq/L (ref 19–32)
Eosinophils Relative: 2 % (ref 0–5)
Glucose, Bld: 97 mg/dL (ref 70–99)
Lymphocytes Relative: 18 % (ref 12–46)
Lymphs Abs: 1.3 10*3/uL (ref 0.7–4.0)
MCHC: 33.8 g/dL (ref 30.0–36.0)
MCV: 93 fL (ref 78.0–100.0)
Monocytes Absolute: 0.5 10*3/uL (ref 0.1–1.0)
Monocytes Relative: 7 % (ref 3–12)
Potassium: 4.3 meq/L (ref 3.5–5.3)
RBC: 4.87 M/uL (ref 4.22–5.81)
RDW: 12.9 % (ref 11.5–15.5)
Total Bilirubin: 0.4 mg/dL (ref 0.3–1.2)
Total Protein: 7.1 g/dL (ref 6.0–8.3)

## 2010-02-15 LAB — T-HELPER CELL (CD4) - (RCID CLINIC ONLY)
CD4 % Helper T Cell: 28 % — ABNORMAL LOW (ref 33–55)
CD4 T Cell Abs: 400 uL (ref 400–2700)

## 2010-02-25 ENCOUNTER — Ambulatory Visit
Admission: RE | Admit: 2010-02-25 | Discharge: 2010-02-25 | Payer: Self-pay | Source: Home / Self Care | Attending: Infectious Diseases | Admitting: Infectious Diseases

## 2010-02-25 ENCOUNTER — Encounter: Payer: Self-pay | Admitting: Infectious Diseases

## 2010-03-02 NOTE — Assessment & Plan Note (Signed)
Summary: 64month f/u [mkj]   CC:  5 month follow up.  History of Present Illness: Mr. Rodney Herrera is doing well w/o complaeint. His ARV regimen is Isentess 400mg  once daily and Truvada once daily. He has been suppressed on this regimen and have not elected to change his Isentres to two times a day and he agees. His platelets have been normal since his HIVhas been controlled. Will f/u in 4-5 months.  Preventive Screening-Counseling & Management  Alcohol-Tobacco     Alcohol drinks/day: 0     Smoking Status: never  Caffeine-Diet-Exercise     Caffeine use/day: 1 cup of coffee     Caffeine Counseling: decrease use of caffeine     Does Patient Exercise: yes     Type of exercise: walk, play golf     Exercise (avg: min/session): >60     Times/week: 7  Safety-Violence-Falls     Seat Belt Use: yes   Current Allergies (reviewed today): No known allergies  Vital Signs:  Patient profile:   65 year old male Height:      71 inches (180.34 cm) Weight:      178.8 pounds (81.27 kg) BMI:     25.03 Temp:     98.1 degrees F (36.72 degrees C) oral Pulse rate:   65 / minute BP sitting:   132 / 89  (left arm)  Vitals Entered By: Baxter Hire) (September 24, 2009 10:50 AM) CC: 5 month follow up Pain Assessment Patient in pain? no      Nutritional Status BMI of 25 - 29 = overweight Nutritional Status Detail appetite is great per patient  Does patient need assistance? Functional Status Self care Ambulation Normal   Physical Exam  General:  alert, well-developed, well-nourished, and well-hydrated.   Mouth:  good dentition and pharynx pink and moist.   Neck:  supple and full ROM.   Lungs:  normal respiratory effort.   Heart:  normal rate.     Impression & Recommendations:  Problem # 1:  HIV INFECTION (ICD-042)  Orders: Est. Patient Level III (99213)Future Orders: T-CBC w/Diff ([id number]) ... 01/21/2010 T-CD4SP ([facility]) (CD4SP) ... 01/21/2010 T-Comprehensive Metabolic  Panel ([id number]) ... 01/21/2010 T-HIV Viral Load [phone]) ... 01/21/2010 Doing well overall and with good control of his HIV as well as his anxiety.  Patient Instructions: 1)  Please schedule a follow-up appointment in 4  months. Please schedule labs 2 weeks prior to appointment.

## 2010-03-02 NOTE — Progress Notes (Signed)
  Phone Note Call from Patient   Caller: Patient Summary of Call: Patient called requesting percocet and 90 day supply of xanax wants to pick up next week Initial call taken by: Starleen Arms CMA,  November 06, 2009 3:49 PM  Follow-up for Phone Call        should still have refills on xanax got 90 day supply in April with 3 refills ok on percocet Follow-up by: Yisroel Ramming MD,  November 09, 2009 10:38 AM  Additional Follow-up for Phone Call Additional follow up Details #1::        ok will print rx for percocet and have Dr. Philipp Deputy to sign it Additional Follow-up by: Starleen Arms CMA,  November 11, 2009 10:45 AM     Prescriptions: PERCOCET 5-325 MG TABS (OXYCODONE-ACETAMINOPHEN) one every 4-6 hours for pain  #120 x 0   Entered by:   Starleen Arms CMA   Authorized by:   Yisroel Ramming MD   Signed by:   Starleen Arms CMA on 11/11/2009   Method used:   Print then Give to Patient   RxID:   [id number]

## 2010-03-02 NOTE — Consult Note (Signed)
Summary: [facility]   Imported By: Florinda Marker 03/12/2009 15:17:36  _____________________________________________________________________  External Attachment:    Type:   Image     Comment:   External Document

## 2010-03-02 NOTE — Consult Note (Signed)
Summary: [facility]  [facility]   Imported By: Florinda Marker 03/19/2009 15:03:04  _____________________________________________________________________  External Attachment:    Type:   Image     Comment:   External Document

## 2010-03-02 NOTE — Miscellaneous (Signed)
Summary: Medication Contract  Medication Contract   Imported By: Florinda Marker 02/25/2009 14:49:02  _____________________________________________________________________  External Attachment:    Type:   Image     Comment:   External Document

## 2010-03-02 NOTE — Progress Notes (Signed)
Summary: Request an early refill on Xanax  Phone Note From Pharmacy   Caller: Tiffany from [facility] pharmacy Details for Reason: early refill Summary of Call: Received a message from patient's pharmacy on Rodney Herrera ID: [id number].  Patient is telling them that he is out-of-his Alprazolam. According to the pharmacy, patient should have a 62-day supply left.  His next refill is 07/12/2009.  If Dr. Maurice March wants to prove an early refill or if deniying it, please notify Tiffany at [phone].  They claim they sent faxes in response to this, but I never saw them. Initial call taken by: Paulo Fruit  BS,CPht II,MPH,  Jun 16, 2009 10:57 AM  Follow-up for Phone Call        Dr. Maurice March signed fax and it was faxed back to pharmacy on 06/18/2009.  Jennet Maduro RN  Jun 19, 2009 10:28 AM

## 2010-03-02 NOTE — Progress Notes (Signed)
Summary: Percocet refill request  Phone Note Call from Patient Call back at Home Phone [phone]   Caller: Patient Reason for Call: Refill Medication Summary of Call: Pt. requesting pain rx refill.  Last refill 12/2008. Jennet Maduro RN  February 23, 2009 4:01 PM     Prescriptions: PERCOCET 5-325 MG TABS (OXYCODONE-ACETAMINOPHEN) one every 4-6 hours for pain  #120 x 0   Entered by:   Jennet Maduro RN   Authorized by:   Lina Sayre MD   Signed by:   Jennet Maduro RN on 02/23/2009   Method used:   Print then Give to Patient   RxID:   [id number]

## 2010-03-02 NOTE — Assessment & Plan Note (Signed)
Summary: Rodney Herrera   CC:  follow-up visit and sleeping much better after being taken off Atripla.  History of Present Illness: Rodney Herrera is doing well in all respects. Dicussed safer sex practices with him since he is dating again and answered his questions. He currrently is on Isentress 800mg once daily. He began once daily regimen over a year ago before two times a day vsQD study was completed. Since he is suppressesd with <48 HIV copies will continue 800mg  qd along with Truvada qd. He has had normal colonoscopy last month and othe rpreventive care is upto date..   Preventive Screening-Counseling & Management  Alcohol-Tobacco     Alcohol drinks/day: 0     Smoking Status: never  Caffeine-Diet-Exercise     Caffeine use/day: 1 cup of coffee     Caffeine Counseling: decrease use of caffeine     Does Patient Exercise: yes     Type of exercise: walk, play golf     Exercise (avg: min/session): >60     Times/week: 7  Hep-HIV-STD-Contraception     HIV Risk: no risk noted  Safety-Violence-Falls     Seat Belt Use: yes  Comments: declined condoms today      Sexual History:  n/a.        Drug Use:  never.     Current Allergies (reviewed today): No known allergies  Social History: Sexual History:  n/a  Vital Signs:  Patient profile:   65 year old male Height:      71 inches (180.34 cm) Weight:      178.1 pounds (80.95 kg) BMI:     24.93 Temp:     97.4 degrees F (36.33 degrees C) oral Pulse rate:   78 / minute BP sitting:   132 / 84  (left arm) Cuff size:   regular  Vitals Entered By: Jennet Maduro RN (April 02, 2009 2:04 PM) CC: follow-up visit, sleeping much better after being taken off Atripla Is Patient Diabetic? No Pain Assessment Patient in pain? no      Nutritional Status BMI of 19 -24 = normal Nutritional Status Detail appetite   Have you ever been in a relationship where you felt threatened, hurt or afraid?No   Does patient need assistance? Functional Status Self  care Ambulation Normal Comments no missed doses    Physical Exam  General:  alert and well-developed.   Head:  no abnormalities observed.   Eyes:  vision grossly intact.   Mouth:  good dentition, no dental plaque, and pharynx pink and moist.   Neck:  supple and full ROM.   Lungs:  normal respiratory effort and no intercostal retractions.     Impression & Recommendations:  Problem # 1:  HIV INFECTION (ICD-042)  Orders: Est. Patient Level III (62130) T-Lipid Profile (86578-46962) T-CBC w/Diff (95284-13244) T-CD4SP ([facility]) (CD4SP) TContinue present meds and RTC 4-58months-Comprehensive Metabolic Panel [phone]) T-HIV Viral Load [phone]) T-RPR (Syphilis) (56387-56433)  Medications Added to Medication List This Visit: 1)  Viagra 50 Mg Tabs (Sildenafil citrate) .... Take one tablet prn  Other Orders: T-Comprehensive Metabolic Panel ([id number]) Est. Patient Level IV (06301)  Patient Instructions: 1)  Please schedule a follow-up appointment in 4-5 months. 2)  Be sure to return for lab work one (2) week before your next appointment as scheduled. Prescriptions: VIAGRA 50 MG TABS (SILDENAFIL CITRATE) Take one tablet prn  #5 x 5   Entered and Authorized by:   Lina Sayre MD   Signed by:   Lina Sayre MD  on 04/02/2009   Method used:   Print then Give to Patient   RxID:   [id number] PERCOCET 5-325 MG TABS (OXYCODONE-ACETAMINOPHEN) one every 4-6 hours for pain  #120 x 0   Entered by:   Jennet Maduro RN   Authorized by:   Lina Sayre MD   Signed by:   Jennet Maduro RN on 04/02/2009   Method used:   Print then Give to Patient   RxID:   [id number]  Process Orders Check Orders Results:     [facility]: ABN not required for this insurance Tests Sent for requisitioning (April 02, 2009 3:25 PM):     04/02/2009: [facility] -- T-Lipid Profile [phone] (signed)     04/02/2009: [facility] -- T-CBC  w/Diff [[id number]] (signed)     04/02/2009: [facility] -- T-Comprehensive Metabolic Panel [[id number]] (signed)     04/02/2009: [facility] -- T-HIV Viral Load [phone] (signed)     04/02/2009: [facility] -- T-RPR (Syphilis) [phone] (signed)

## 2010-03-02 NOTE — Progress Notes (Signed)
Summary: alprazolam refill  Phone Note Call from Patient   Caller: Patient Reason for Call: Refill Medication Summary of Call: Pt. askiing about refill on alprazolam.  RN spoke with pharmacist at [facility] [place] where the pt. wanted rx called in to fill.  Pharmacist reminded RN that the original rx would go out-of-date 6 months from the original prescription.  Pt. has return appts scheduled for Jan. 2012 for Labs and MD. Refill called in to pharmacy.   Jennet Maduro RN  November 20, 2009 3:00 PM     Prescriptions: ALPRAZOLAM 0.5 MG TABS (ALPRAZOLAM) one every 8 hours  #90 x 2   Entered by:   Jennet Maduro RN   Authorized by:   Lina Sayre MD   Signed by:   Jennet Maduro RN on 11/20/2009   Method used:   Telephoned to ...       [facility]  [address] [phone]* (retail)       [address]       [place], [place]  [address]       Ph: [phone] or [phone]       Fax: [phone]   RxID:   [id number]

## 2010-03-02 NOTE — Miscellaneous (Signed)
Summary: Problem list update, pt. aware that rx is ready fo pick-up  Phone call to pt. to let him know that rx was ready for pick-up. 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

## 2010-03-04 NOTE — Assessment & Plan Note (Signed)
Summary: F/U OV   CC:  follow-up visit and wanting to discuss the longterm effects of higher triglycerides.  History of Present Illness: Rodney Herrera is doing very well with supressed HIV and CD4 of 400 and is stable. Platelets are normal at 197K. He currently is on Isnetress 400mg  onec daily by his own preference despite my instructions. His medswere refilled wtihou any changes.   Preventive Screening-Counseling & Management  Alcohol-Tobacco     Alcohol drinks/day: 0     Smoking Status: never  Caffeine-Diet-Exercise     Caffeine use/day: 1 cup of coffee     Caffeine Counseling: decrease use of caffeine     Does Patient Exercise: yes     Type of exercise: walk, play golf     Exercise (avg: min/session): >60     Times/week: 7  Hep-HIV-STD-Contraception     HIV Risk: no risk noted     HIV Risk Counseling: not indicated-no HIV risk noted  Safety-Violence-Falls     Seat Belt Use: yes  Comments: declined condoms      Sexual History:  n/a.        Drug Use:  never.     Current Allergies (reviewed today): No known allergies  Vital Signs:  Patient profile:   65 year old male Height:      71 inches (180.34 cm) Weight:      182.75 pounds (83.07 kg) BMI:     25.58 Temp:     97.7 degrees F (36.50 degrees C) oral Pulse rate:   65 / minute BP sitting:   156 / 95  (left arm) Cuff size:   regular  Vitals Entered By: Jennet Maduro RN (February 25, 2010 9:58 AM) CC: follow-up visit, wanting to discuss the longterm effects of higher triglycerides Is Patient Diabetic? No Pain Assessment Patient in pain? no      Nutritional Status BMI of 25 - 29 = overweight Nutritional Status Detail appetite "steady"  Have you ever been in a relationship where you felt threatened, hurt or afraid?No   Does patient need assistance? Functional Status Self care Ambulation Normal Comments no missed doses   Physical Exam  General:  Well-developed,well-nourished,in no acute distress;  alert,appropriate and cooperative throughout examination Mouth:  Oral mucosa and oropharynx without lesions or exudates.  Teeth in good repair.   Impression & Recommendations:  Problem # 1:  HIV INFECTION (ICD-042) Assessment Unchanged  Note creatinine is l.46 with est GFR of 49 and informed him thatwe will need tomonitor q 4-68months. RTC 5-33months  Orders: Est. Patient Level IV (16109)  Medications Added to Medication List This Visit: 1)  Alprazolam 0.5 Mg Tabs (Alprazolam) .... Take 1 tablet by mouth every 4 hours as needed  Patient Instructions: 1)  Please schedule a follow-up appointment in 5-6 months. 2)  Be sure to return for lab work one (1) week before your next appointment as scheduled. Prescriptions: PERCOCET 5-325 MG TABS (OXYCODONE-ACETAMINOPHEN) one every 4-6 hours for pain  #180 x 0   Entered by:   Jennet Maduro RN   Authorized by:   Lina Sayre MD   Signed by:   Jennet Maduro RN on 02/25/2010   Method used:   Print then Give to Patient   RxID:   [id number] PERCOCET 5-325 MG TABS (OXYCODONE-ACETAMINOPHEN) one every 4-6 hours for pain  #120 x 0   Entered by:   Jennet Maduro RN   Authorized by:   Lina Sayre MD   Signed by:   Jennet Maduro  RN on 02/25/2010   Method used:   Print then Give to Patient   RxID:   [id number] LIPITOR 20 MG TABS (ATORVASTATIN CALCIUM) Take 1 tablet by mouth once a day  #90 x 3   Entered by:   Jennet Maduro RN   Authorized by:   Lina Sayre MD   Signed by:   Jennet Maduro RN on 02/25/2010   Method used:   Print then Give to Patient   RxID:   [id number] GABAPENTIN 300 MG CAPS (GABAPENTIN) one three times a day  #270 x 3   Entered by:   Jennet Maduro RN   Authorized by:   Lina Sayre MD   Signed by:   Jennet Maduro RN on 02/25/2010   Method used:   Print then Give to Patient   RxID:   [id number] PROMETHAZINE HCL 25 MG TABS (PROMETHAZINE HCL) one every 8 hours as needed for nausea  #270 x 3    Entered by:   Jennet Maduro RN   Authorized by:   Lina Sayre MD   Signed by:   Jennet Maduro RN on 02/25/2010   Method used:   Print then Give to Patient   RxID:   [id number] ALPRAZOLAM 0.5 MG TABS (ALPRAZOLAM) Take 1 tablet by mouth every 4 hours as needed  #360 x 1   Entered by:   Jennet Maduro RN   Authorized by:   Lina Sayre MD   Signed by:   Jennet Maduro RN on 02/25/2010   Method used:   Print then Give to Patient   RxID:   [id number] ISENTRESS 400 MG TABS (RALTEGRAVIR POTASSIUM) Take 2 tablets by mouth once a day  #180 x 3   Entered by:   Jennet Maduro RN   Authorized by:   Lina Sayre MD   Signed by:   Jennet Maduro RN on 02/25/2010   Method used:   Print then Give to Patient   RxID:   [id number] TRUVADA 200-300 MG TABS (EMTRICITABINE-TENOFOVIR) Take 1 tablet by mouth once a day  #90 x 3   Entered by:   Jennet Maduro RN   Authorized by:   Lina Sayre MD   Signed by:   Jennet Maduro RN on 02/25/2010   Method used:   Print then Give to Patient   RxID:   [id number]

## 2010-03-04 NOTE — Miscellaneous (Signed)
Summary: future labs  Clinical Lists Changes  Orders: Added new Test order of T-CBC w/Diff [phone]) - Signed Added new Test order of T-CD4SP [facility]) (CD4SP) - Signed Added new Test order of T-Comprehensive Metabolic Panel [phone]) - Signed Added new Test order of T-HIV Viral Load [phone]) - Signed     Process Orders Check Orders Results:     [facility]: ABN not required for this insurance Order queued for requisitioning for [facility]: February 25, 2010 9:07 AM  Tests Sent for requisitioning (February 25, 2010 9:07 AM):     06/14/2010: [facility] -- T-CBC w/Diff [[id number]] (signed)     06/14/2010: [facility] -- T-Comprehensive Metabolic Panel [[id number]] (signed)     06/14/2010: [facility] -- T-HIV Viral Load [phone] (signed)

## 2010-04-16 LAB — T-HELPER CELL (CD4) - (RCID CLINIC ONLY): CD4 % Helper T Cell: 25 % — ABNORMAL LOW (ref 33–55)

## 2010-04-19 ENCOUNTER — Telehealth (INDEPENDENT_AMBULATORY_CARE_PROVIDER_SITE_OTHER): Payer: Self-pay | Admitting: *Deleted

## 2010-04-23 LAB — T-HELPER CELL (CD4) - (RCID CLINIC ONLY)
CD4 % Helper T Cell: 29 % — ABNORMAL LOW (ref 33–55)
CD4 T Cell Abs: 570 uL (ref 400–2700)

## 2010-04-27 ENCOUNTER — Other Ambulatory Visit: Payer: Self-pay | Admitting: *Deleted

## 2010-04-27 ENCOUNTER — Encounter: Payer: Self-pay | Admitting: *Deleted

## 2010-04-27 DIAGNOSIS — G43909 Migraine, unspecified, not intractable, without status migrainosus: Secondary | ICD-10-CM

## 2010-04-27 MED ORDER — OXYCODONE-ACETAMINOPHEN 5-325 MG PO TABS: 1.0000 | ORAL_TABLET | Freq: Four times a day (QID) | ORAL | Status: DC | PRN

## 2010-05-04 NOTE — Progress Notes (Signed)
Summary: request for pain rx, pt moving to [place]  Phone Note Call from Patient   Summary of Call: moving to [place] next month.  wants to see md to get rx for percocet & to say good bye. wants to know where he should go in [place] for his care.   [phone]. per front office we have no appts for him with Dr. Maurice March in the next month. told him I will send this to Dr. Maurice March & call him with response Initial call taken by: Golden Circle RN,  April 19, 2010 2:28 PM  Follow-up for Phone Call        Dr. Maurice March called for another issue. told him about this. he stated he will review the message & respond Follow-up by: Golden Circle RN,  April 19, 2010 2:34 PM    Prescriptions: PERCOCET 5-325 MG TABS (OXYCODONE-ACETAMINOPHEN) one every 4-6 hours for pain  #60 x 0   Entered by:   Jennet Maduro RN   Authorized by:   Lina Sayre MD   Signed by:   Jennet Maduro RN on 04/29/2010   Method used:   Print then Give to Patient   RxID:   [id number]

## 2010-05-07 LAB — T-HELPER CELL (CD4) - (RCID CLINIC ONLY)
CD4 % Helper T Cell: 28 % — ABNORMAL LOW (ref 33–55)
CD4 T Cell Abs: 440 uL (ref 400–2700)

## 2010-06-18 NOTE — Discharge Summary (Signed)
NAME:  Rodney Herrera, Rodney Herrera NO.:  [id number]   MEDICAL RECORD NO.:  [id number]          PATIENT TYPE:  INP   LOCATION:  5739                         FACILITY:  [facility]   PHYSICIAN:  Fransisco Hertz, M.D.  DATE OF BIRTH:  [birth year]   DATE OF ADMISSION:  03/20/2004  DATE OF DISCHARGE:  04/17/2004                                 DISCHARGE SUMMARY   ADDENDUM TO DISCHARGE DIAGNOSIS NUMBER NEXT (? numbers)   Possible pseudomonas urinary tract infection.  Possible enterococcal bacteremia (blood cultures drawn from [facility] line).   ADDENDUM TO DISCHARGE MEDICATIONS (number next ? numbers)   Epzicom 300 mg p.o. daily.  Viramune 200 mg p.o. q.12h.  Neurontin 300 mg p.o. q.h.s.  Ciprofloxacin 500 mg p.o. q.12h. for five days.  Augmentin 875 mg p.o. q.12h. for five days (the patient is to complete the  latter antibiotics for a total treatment duration of seven to eight days and  this has been preceded by intravenous Primaxin therapy).   DISPOSITION AND FOLLOWUP:  1.  The patient is to be followed up at Dr. Billey Chang office on [address] on April 11, at 11 a.m.  2.  The patient will be followed up at the [facility] Outpatient clinic by      Dr. Lina Sayre in two to three weeks.  The details of his appointment      will be mailed or called into him upon discharge.   ADDENDUM TO HOSPITAL COURSE FOR PROBLEM #15 (FEVER)   The patient was extensively investigated for cause of fever that he spiked  around five days prior to discharge.  Urinary cultures were obtained that  revealed pseudomonas aeruginosa with more than 100,000 colonies and this was  susceptible to imipenem, as well as ciprofloxacin.  On the day of discharge,  it also was revealed that his blood culture that was drawn from the [facility] line  was positive for enterococcus species and this was sensitive invariability  to vancomycin, gentamicin, and ampicillin.  It was concluded that the  patient had responded to  Primaxin during his inpatient stay in that he  defervesced and had good resolution of his white cell count.  Upon  discharge, the patient was put on ciprofloxacin and Augmentin that he will  take for five more days to complete a total treatment duration of seven to  eight days in combination with IV Primaxin.  The patient has been asked to  monitor his own temperatures and to immediately call the [facility]  Outpatient clinic should he persist having fevers or start developing any  features of intraabdominal pain or generalized malaise.    JP/MEDQ  D:  04/17/2004  T:  04/18/2004  Job:  [id number]   cc:   [facility]

## 2010-06-18 NOTE — Consult Note (Signed)
NAME:  Rodney Herrera, Rodney Herrera NO.:  [id number]   MEDICAL RECORD NO.:  [id number]          PATIENT TYPE:  INP   LOCATION:  2112                         FACILITY:  [facility]   PHYSICIAN:  Cherylynn Ridges III, M.D.DATE OF BIRTH:  04/24/1945   DATE OF CONSULTATION:  03/20/2004  DATE OF DISCHARGE:                                   CONSULTATION   Dear Dr. 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  The patient's more remote surgical history consists of having  to have a colostomy perhaps two years ago for perforated diverticulitis or  acute diverticulitis which did not at that time require collective end  colostomy.  However, he did subsequently develop an abdominal wall hernia  that had to be repaired with mesh.  That mesh was encountered on this last  operation which was two days ago on March 18, 2004, and had to be removed  with the fascia being closed.   I was asked to see Rodney Herrera to assess his wound and also his stoma  which appeared to be necrotic.  I read through the surgeon's operative  report and there was a significant amount of fecal contamination of the  wound.  He has a Hartman's pouch left in place and he did have a previous  colectomy making the blood supply to the remaining colon somewhat in  question.   The patient has a left upper quadrant colostomy on examination.  It has  about a half an inch nipple, most of which is sort of beefy red, not sort of  a pale pink.  The lateral superior aspect appears to have some superficial  mucosal necrosis.  There does not appear to be an essential necrosis near  the  actual opening of the colostomy or any full thickness necrosis that  would require revision.  His midline wound is well apposed and it is packed  with  pieces of Nu Gauze which I removed and replace with saline soaked one  inch clean.  This packed very nicely and very well and the oozing did stop,  however, I was suspicious that the patient may have a slight coagulopathy.  We will go ahead and order him to have dressing changes twice a day of this  type.   I had no bowel sounds and the other surfaces are actively treating what is  currently additional problems including respiratory failure and maybe the  onset of some oliguria with renal insufficiency.   It is my assessment that there is no ongoing abdominal source of sepsis,  although it is very difficulty to know this completely without exploring or  a CT scan which I do not recommend at this time.  His abdominal wound  appears to be fine.  It should be treated with dressing changes.  The  stomach, I believe, has some superficial mucosal necrosis which will slough  over time but not likely to require  any surgical revision.  The patient has  a high mortality rate based on his sepsis and organ failure, however,  because of this he is in the process of being treated with low stretch  respiratory protocol using high levels of PEEP.  No acute surgical  intervention is necessary at this time.  He will be followed by the [facility] Surgical Service.   Thank you very much for this consultation.      JOW/MEDQ  D:  03/20/2004  T:  03/22/2004  Job:  [id number]   cc:   Adonis Housekeeper, M.D.  Internal Medicine Resident-[place]  [place] [address]  Fax: [phone]

## 2010-06-18 NOTE — Op Note (Signed)
NAME:  Rodney Herrera, Rodney Herrera NO.:  [id number]   MEDICAL RECORD NO.:  [id number]          PATIENT TYPE:  INP   LOCATION:  2112                         FACILITY:  [facility]   PHYSICIAN:  Ollen Gross. Vernell Morgans, M.D. DATE OF BIRTH:  October 06, 1945   DATE OF PROCEDURE:  04/02/2004  DATE OF DISCHARGE:                                 OPERATIVE REPORT   PREOPERATIVE DIAGNOSIS:  Open abdomen.   POSTOPERATIVE DIAGNOSIS:  Open abdomen.   OPERATION PERFORMED:  Abdominal wound closure using a 6 x 12 cm piece of  Alloderm and vacuum dressing.   SURGEON:  Ollen Gross. Carolynne Edouard, M.D.   ASSISTANT:  Gabrielle Dare. Janee Morn, M.D.   ANESTHESIA:  General endotracheal.   DESCRIPTION OF PROCEDURE:  After informed consent was obtained, the patient  was brought to the operating room and placed in supine position on the  operating table.  After adequate induction of general anesthesia, the  patient's old VAC was removed.  The patient's abdomen was prepped with  Betadine and draped in the usual sterile manner.  The abdomen was then  irrigated with saline.  The abdomen was very clean.  The patient had a  nicely formed sort of valve ring.  The lower portion of the abdominal wall  was closed with multiple interrupted #1 Novofils.  The superior portion of  the wound would not come together without undue tension.  A 6 x 12 cm piece  of Alloderm was then thawed and then used to bridge the opening of the  superior portion of the wound.  Alloderm was sewed to the fascial edge  superiorly in the wound using interrupted #1 Novofils.  Once this was  accomplished, the abdominal wall was closed without much undue tension.  A  new piece of Adaptic was placed over the Alloderm and then a new vacuum was  applied.  The patient tolerated the procedure well.  A the end of the case,  all sponge, needle and instrument counts were correct.  The patient was then  awakened and taken to the recovery room in stable condition.      PST/MEDQ  D:  04/04/2004  T:  04/05/2004  Job:  [id number]

## 2010-06-18 NOTE — Op Note (Signed)
[place]  Patient:    Rodney Herrera, Rodney Herrera Visit Number: [id number] MRN: [id number]          Service Type: MED Location: 5000 [id number] Attending Physician:  Edwyna Perfect Dictated by:   Jerene Canny, MS-4 Proc. Date: 08/07/01 Admit Date:  08/06/2001 Discharge Date: 08/09/2001   CC:         Dr. Elyn Aquas, [place]   Operative Report  DISCHARGE DIAGNOSES: 1. Clostridium difficile. 2. Human immunodeficiency virus. 3. Anemia. 4. Pancreatic mass. 5. Depression.  DISCHARGE MEDICATIONS: 1. Zoloft 50 mg p.o. q.h.s. 2. Ativan 0.5 mg p.o. q.4-6h. p.r.n. for anxiety/agitation. 3. Vancomycin capsules 125 mg p.o. q.i.d. x3 weeks. 4. Tylenol 650 mg p.o. q.6h. p.r.n. for pain. 5. Phenergan 25 mg p.o. q.6-8h. p.r.n. for nausea. 6. The patient also takes Percocet 5/325 mg at home, one tablet p.o. q.6-8h.    for pain as needed.  HISTORY OF PRESENT ILLNESS:  This is a 65 year old Caucasian male with a history of HIV which was diagnosed in 2000, with a CD4 count of 575 and a viral load of 1000, dated May 2003, who has a history of three bouts of diverticulitis in this past year.  The patient had a history of left colon resection on July 16, 2001, at [facility] performed by Dr. Georgiana Shore.  The pathology of the left colon excision was significant for diverticulitis with a pericolic abscess formation.  The patient subsequently had the abscess fluid drained on July 23, 2001, and had a percutaneous CT-guided biopsy drainage of that fluid.  The patient was afebrile postoperatively and placed on prophylactic antibiotics and was later discharged on the antibiotics.  The patient admits to diarrhea that was present both pre- and postoperatively and is chronic in nature, lasting for about three weeks.  The patient failed to inform his primary care physician and surgeon.  On August 05, 2001, the patient was released from [facility]  as his nausea and vomiting were able to be controlled by Phenergan. The patient has noted an increase in his diarrhea since the one day of discharge with increasing nausea and vomiting and no ability to take anything p.o.  He has subjective fevers but denies any bleeding per rectum or any other sources of bleeding.  He denies any shortness of breath, no chest pain, but he does state that he has been dizzy and lightheaded.  The patient presented to the emergency department after contacting Dr. Georgiana Shore down in [place], who told him he either would have to be readmitted to their services or to come and see Dr. Maurice March here at [facility] in the infectious disease department.  ALLERGIES:  The patient has no known allergies.  PAST MEDICAL HISTORY:  1. HIV.  2. Diverticulitis.  3. Left colon resection on July 16, 2001.  4. Cervical neck surgery.  5. Pancreatic masses.  6. Arthroscopic knee surgery.  7. Bursitis.  8. Right inguinal hernia with repair.  9. Skull fracture. 10. Shingles.  MEDICATIONS:  At admission the patients medications were only Neurontin.  FAMILY AND SOCIAL HISTORY:  Significant for his mother dying of an acute MI back in 2000 and his wife dying of HIV-related PCP back in 2000, and the patient was subsequently diagnosed with AIDS at that time.  The patient denies any smoking or alcohol use and has sons and daughters that he is close with.  REVIEW OF SYSTEMS:  As per HPI and is most significant for nausea,  vomiting, subjective fevers, dizziness, and fatigue times several weeks to months, per the patient.  ADMISSION PHYSICAL EXAMINATION:  VITAL SIGNS:  Temperature 101.3, blood pressure 137/90.  The patients orthostatic measurements were supine 136/79 with a pulse of 85, sitting the patients blood pressure was 135/75 with a pulse of 85, and finally standing the patients blood pressure was 122/87 with a pulse of 100.  GENERAL:  The patient was in mild distress, was  tachypneic, but was alert and oriented x3.  HEENT:  There were no apparent signs of trauma, normocephalic, atraumatic. The pupils were equal, round and reactive to light and accommodation bilaterally.  His extraocular motion was intact.  There was a small fissure of a white plaque on his tongue, which was 1 x 5 cm.  There was no evidence of thrush.  NECK:  No lymphadenopathy, no JVD, and no masses.  CHEST:  His lungs were clear to auscultation with no increased work of breathing.  CARDIAC:  Regular rate with S1 and S2 being constant but was tachycardic.  No murmurs, gallops, or rubs were appreciated.  ABDOMEN:  Soft and mild diffuse tenderness, left greater than right.  He had a midline scar approximately 7 cm long, which he states was secondary to his left colon resection.  The incision was clean, dry, and intact without any purulence.  EXTREMITIES:  No clubbing, cyanosis, or edema.  He had bilateral 2+ DP pulses. His skin was warm, and he had less than three second capillary refill.  NEUROLOGIC:  Grossly his neurologic exam II-XII was intact.  ADMISSION LABORATORY DATA:  White blood cells 8.8, hemoglobin 12.8, hematocrit 37.3, MCV was 82.1, and his platelet count was 222.  His electrolytes were sodium 133, potassium 3.3, chloride 103, CO2 23, BUN 11, creatinine 1.2, total bilirubin 0.7, alkaline phosphatase 70, AST 20, ALT 24.  Glucose 110.  Amylase was 77, lipase was 39.  His fecal occult blood was positive.  His urinalysis was grossly positive for ketones 40 and urobilinogen 0.2.  Most notably the urinalysis was negative for nitrite and for leukocytes.  The chest x-ray was performed, which was negative for infiltrate, and there was no cardiomegaly.  A KUB showed a colonic ileus.  HOSPITAL COURSE:  #1 - DIARRHEA:  The patient presented without being able to tolerate any p.o. intake and had a fever. His labs were also significant for a low potassium,  and he failed  orthostatics.  The patient was admitted and started on IV fluids to replete both sodium and potassium, and stools were sent for guaiac and C. difficile as well as ova and parasites.  The plan was to hydrate the patient and keep him NPO pending potential CT contrast of the abdomen with contrast to evaluate a potential abscess.  The patient was given Phenergan 12.5 mg for nausea and morphine for his pain.  Subsequent CBC with differential and a BMP were obtained as follow-up in the morning, and his potassium was repleted.  I called Dr. Georgiana Shore of [place] to discuss the patients case and previous surgery.  The patient was previously seen for his HIV by Dr. Maurice March of [facility] in the infectious disease/internal medicine department.  The patient chose to come to [facility] to have a more thorough workup of his chronic diarrhea.  Because the patient had a history of diarrhea and presented with a fever at the time of admission, he was started empirically on Zosyn until further workup could be established.  The patient kept  NPO overnight, the day of admission, in anticipation of his CT contrast of his abdomen to evaluate a potential abscess; however, the patient declined the CT scan and was restarted on clear fluids, and his IVs were stopped as well as his IV Zosyn.  The initial test for C. difficile was negative on July 9.  The repeat C. difficile test was positive on the July 10.  The patient was started on vancomycin 125 mg p.o. t.i.d., and he is to continue this treatment therapy for three weeks.  The patient was afebrile for greater than 24 hours and tolerated the p.o. intake of food and all medications.  The patient was also strongly encouraged to increase his p.o. of fluids, including water and Gatorade, as well as to take potassium to maintain his electrolytes, specifically his potassium level.  #2 - HUMAN IMMUNODEFICIENCY VIRUS:  The patients current viral load and CD4 count are  favorable.  The patient was encouraged to follow up as previously established with Dr. Maurice March of infectious disease at [facility].  On Jun 26, 2001, the CD4 count was 530 and the T-helper count was 38.  #3 - ANEMIA:  On admission the patients hemoglobin was 12.8 and hematocrit was 37.3.  This is most likely secondary to his HIV status.  Because it is borderline low, the patient was told to follow up with his primary care physician for further evaluation.  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 Malcolm Metro at [facility].  He declined any further investigation on this admission and says that he is following up with the [facility] regarding this issue.  Of note, his amylase and lipase were within normal limits at this admission.  #5 - DEPRESSION/ANXIETY:  The patient had several episodes during admission of tearfulness and evidence of anxiety by short, rapid breathing that was unassociated with pain.  We had several lengthy discussions with his daughter and the patient himself regarding the potential benefit of having either an anxiolytic or an antidepressant.  The patient received two doses of Ambien during his visit at the hospital and noted improved ability to sleep and to concentrate as well as a decrease in agitation.  The patient was empirically started on Ativan 0.5 mg p.o. q.4-6h. for daytime anxiety and anxiousness as well as Zoloft 50 mg p.o. q.h.s.  In consulting with pharmacy and reviewing his medication profile, the primary side effect of weight gain was not something that was of concern to the patient.  The patient informed me that he would follow up with his primary care physician as well as Dr. Georgiana Shore of [place] with regard to this matter.  The patient had a follow-up appointment with Dr. Georgiana Shore of  [place] on August 14, 2001, at 2:15 p.m.  NOTE:  Please make sure that Dr. Georgiana Shore gets a copy of this report as well as any others that he requests.  If he needs to contact me, my name is Retail [employer].  My pager number is [phone], and home telephone number is [phone], and I will be available for further questions.Dictated by: Jerene Canny, MS-4 Attending Physician:  Edwyna Perfect DD:  08/10/01 TD:  08/14/01 Job: [id number]

## 2010-06-18 NOTE — Discharge Summary (Signed)
NAME:  Rodney Herrera, Rodney Herrera            ACCOUNT NO.:  [id number]   MEDICAL RECORD NO.:  [id number]          PATIENT TYPE:  INP   LOCATION:  3028                         FACILITY:  [facility]   PHYSICIAN:  Judie Grieve, MD  DATE OF BIRTH:  [birth year]   DATE OF ADMISSION:  05/02/2004  DATE OF DISCHARGE:  05/10/2004                                 DISCHARGE SUMMARY   DISCHARGE DIAGNOSES:  1.  Nausea and vomiting, most likely secondary to medication intolerance      including Fentanyl and HAART.  2.  Leukocytosis, resolved.  Blood cultures x2 negative.  C. difficile      negative.  3.  HAART on hold secondary to medication intolerance.  4.  Hypokalemia secondary to nausea and vomiting, resolved.  5.  Hypomagnesemia, resolved.  6.  Thrombocytopenia with decreasing platelet count during this admission      from 525 to 246.  7.  History of sigmoid diverticulitis status post partial colectomy and      colostomy July 2003.  8.  History of colonic perforation status post laparotomy with resection of      colon, segment of small bowel, appendectomy with mesh excision and      colostomy in left upper quadrant on  March 18, 2004.  9.  History of wound dehiscence status post laparotomy and repair with      fascial debridement on March 24, 2004 with wound VAC placement.  10. History of adult respiratory distress syndrome February 2006.  11. Hypertensive shock February 2006.  12. Human immunodeficiency virus.  13. 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  14. History of pancreatic mass, stable on repeat CT on May 03, 2004.  15. History of depression and anxiety.  16. History of hyperlipidemia.  17. History of anemia, iron deficiency.  18. History of chronic nausea.   DISCHARGE MEDICATIONS:  1.  Remeron 15 mg p.o. q.h.s.  2.  Xanax 0.5 mg p.o. b.i.d.  3.  Percocet one tablet p.o. q.8 h. p.r.n. pain.  4.  Phenergan 12.5 mg 1-2 tablets p.o. q.6 h. p.r.n. nausea.   DISCUSSION AND FOLLOW UP:  Rodney Herrera has been discharged home in fair  condition to follow up with his primary care physician Dr. Lina Sayre in  the ID Clinic at [facility] on Wednesday, April 12 at 3 p.m.  At  that time need to follow up on the following:  1.  CBC to check his WBC count off Primaxin.  Last WBC count was 10.1 on the      day of discharge.  2.  Follow up on thrombocyte count of heart which went down from 525 to 246.  3.  Need to decide whether to restart him back on heart medicine.  4.  Follow up on his clinical improvement including nausea and vomiting.  5.  Follow up on his BMET for potassium level.   CONSULTATIONS:  Surgery by Dr. Zachery Dakins.   PROCEDURE:  1.  Wound VAC changed.  2.  Imaging.  3.  CT abdomen and pelvis May 03, 2004 showing stable appearance of low  attenuation pancreatic mass and completely characterized in this study.  4.  Increase in size of a small focal fluid collection in the left upper      quadrant anterior to the spleen, question infection versus sterile      collection measuring 2.2 into 2.6 cm.  5.  Persistent stranding of tissue planes throughout abdomen, slight more      prominent that on previous study.  Question related to inflammation      versus prior surgery.  6.  CT pelvis.  No discrete abscesses.  7.  CT pelvis and abdomen repeated on May 06, 2004 showed no significant      change from previous CT.  One region which may require followup is      __________ surgical anastomosis in the left lower quadrant of the pelvis      around 2.5-1.8 cm which can not be confirmed as fluid within the bowel.      May be related to result of prior perforation or postoperative      complication.  This appears relatively similar.  Can be further      evaluated on follow up.  8.  Chest x-ray unremarkable.  Slight right basilar atelectasis versus      scarring.   HISTORY OF PRESENT ILLNESS:  Rodney Herrera is a 65 -year-old male  patient  with past medical history significant for HIV.  Last CD-4 count being 650 on  HAART therapy.  Recently discharged from [facility] on April 17, 2004 after admission for GI perforation at [facility].  Had  laparotomy with resection of small bowel and colon and colostomy.  Complicated with postoperative ARDS septic shock.  Wound dehiscence status  post debridement and closure on VAC therapy and has been feeling nauseous  since he was discharged but reports recent increase in the nausea since the  past two days along with vomiting, unable to tolerate p.o. including his  medication and even liquid.  Has significant history of weight loss around  10-14 pounds.  Other than that he did not complain of fever or chills,  hematemesis, no mention about decrease output from the colostomy bag.   PHYSICAL EXAMINATION:  VITAL SIGNS:  Pulse 106, blood pressure 135/94,  temperature 96, respiratory rate 24, O2 saturation 95% on room air.  GENERAL:  The patient was in no acute distress laying down in bed.  HEENT:  Eyes:  PERRLA.  Extraocular muscles intact.  ENT:  Oropharynx clear,  no thrush, no lesions.  NECK:  Supple, no organomegaly, no JVD, no carotid bruits.  RESPIRATORY:  Clear to auscultation bilaterally.  No wheezing or rhonchi.  CARDIOVASCULAR:  S1 and S2 pulse regular, tachycardic rhythm, no murmur or  gallops appreciated.  GI:  Soft, abdomen nondistended, mild diffuse soreness without any  tenderness even on deep palpation.  No rebound or guarding.  Positive bowel  sounds.  Wound VAC clean midline.  Colostomy bag left periumbilical area  filled with brown stool.  No surrounding erythema, no surrounding discharge.  EXTREMITIES:  No edema, cyanosis or clubbing appreciated.  SKIN:  No rash, skin is warm and dry.  LYMPHADENOPATHY:  No cervical lymphadenopathy.  NEUROLOGIC:  Appropriate, alert, awake, oriented x3.  Cranial nerves intact. Muscle strength 5/5 in all  extremities.  PSYCHIATRIC:  Slightly anxious.   HOME MEDICATIONS:  1.  Xanax 0.5 mg p.o. b.i.d.  2.  __________ 300 mg p.o. daily.  3.  Viramune 200 mg p.o. q.12 h.  4.  Neurontin 300 mg p.o. q.h.s.  5.  Fentanyl 50 mcg transdermal patch q.72 h.  6.  Protonix 40 mg p.o. b.i.d.  7.  Tylenol 650 mg p.o. q.6 h. p.r.n. pain.  8.  Albuterol 2.5 mg q.6 h. p.r.n.  9.  Atrovent 0.5 mg q.6 h. p.r.n.  10. Percocet two tablets p.o. q.6 h. p.r.n. pain.  11. Phenergan 12.5 mg p.o. p.r.n. nausea.  12. Mylanta 80 mg p.o. q.i.d. p.r.n.  13. Finished Augmentin five-day course five days prior to the day of      admission.   ADMISSION LABORATORY:  CBC showed hemoglobin 10.7, white count 15.6,  thrombocyte count 581, ANC 11.4, MCV 77.1.  BMET showed sodium 136,  potassium 4, chloride 100, bicarbonate 24, BUN 6, creatinine 1.1, glucose  118.  Bilirubin 0.7, alkaline phosphatase 76, AST 22, ALT 16, total protein  7.4, albumin 3.2, calcium 9.3.  Urinalysis was negative for nitrites.  Showed ketones 40, hyaline cast, potassium oxalate crystals.  Acute  abdominal series showed normal bowel gas pattern, no small-bowel obstruction  or free air.   HOSPITAL COURSE:  Problem 1. Nausea and vomiting.  Most likely secondary to  medication intolerance, probably Fentanyl patch.  Question whether secondary  to HAART.  Given his recent history of major surgery and multiple  complications for surgery were concerned about intraabdominal pathology  including intraabdominal abscess versus anastomotic leakage from the  surgical site versus chronic peritonitis secondary to inflammatory process.  Acute abdominal series was done which was negative for obstruction.  Did not  show free air.  Lipase was checked to rule out pancreatitis which was within  normal limits at 33.  Pan culture were sent prior to antibiotic therapy and  he was started on Primaxin for empiric treatment.  There was also question  whether he was  incompletely treated for his bacteremia since the blood  culture from his previous hospitalization which was done on April 13, 2004  was positive for enterococcus and he was treated with two days of Primaxin  plus five days of Augmentin from April 17, 2004 to April 24, 2004.  Given  his multiple risk factors CT scan of abdomen was done which showed fluid-  filled cyst questionable for infection versus C. difficile fluid in  hepatosplenic area.  Pancreatic mass which was seen on his prior CT was  stable.   Given the possibility of intolerance to multiple medications he was on  discontinued his Fentanyl patch and also his HIV medication and Neurontin on  May 05, 2004.  Also consulted surgery for evaluation of questionable cystic  lesion in the splenic area whether it is infected versus abscess.  To assess  if it needs percutaneous drainage or continued antibiotic therapy.  He remained afebrile during his hospital stay.  His leukocyte count trended  down during his hospital stay to be from 15 to 9.6 at one point and elevated  to 11.2 the next day.  Surgery consult was done by Dr. Zachery Dakins who  suggested that although the cystic lesion in the splenic area is concerning  but it would not be the reason of his symptoms of nausea and vomiting.  Repeat CT scan was done to see if the cyst size has been increasing or  decreasing on antibiotic therapy which did not show any significant change  and the size remained same.  Pan cultures were done which were negative.  Primaxin was discontinued on the 7th on surgery consult advise.  He was also  started on Phenergan p.r.n. and Zofran p.r.n. for symptomatic treatment of  nausea.  In spite of q.4 h. dosage of Phenergan his symptoms were not  improving at that time but gradually on discontinuing of the HAART and  Fentanyl patch and Neurontin his symptoms started improving and NPO was also  discontinued and he was started on p.o. and he made an excellent  improvement  and started taking good oral intake.  The dosage of Phenergan he needed was  decreased and he was tolerating p.o. Phenergan.  His WBC count trended down  off Primaxin from 11.2 to 13.2 and then trended down to 10.2 to 10.1 on the  day of discharge.   The patient will follow up with his primary care physician Dr. Lina Sayre  on Wednesday, May 12, 2004.  At that time need to follow up on his white  count off Primaxin.   Problem 2. Leukocytosis.  Resolved at the time of discharge.  Again, pan  cultures were done including blood cultures which were negative.  Urine  cultures were negative and C. difficile was done x2 negative.  He was  started on Primaxin empirically from May 02, 2004 until May 07, 2004.  His  white count remained stable at the time of discharge at 10.0.  Need to  follow up on this further at the time of his clinic appointment.   Problem 3. On April 4, again his HAART medication was held since we are  concerned whether his intolerance to his multiple medications.  Wanted to  watch him without many p.o. medications until his nausea and vomiting  resolved.  Although most likely this secondary to Fentanyl and when to  restart him back on HAART needs to be decided by his primary care physician  Dr. Lina Sayre.   Problem 4. Decreasing platelet count.  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  Although his thrombocyte count of  581 at the time of admission could be elevated secondary to inflammatory  process and decrease in the platelet count could be secondary to treatment  with Primaxin.  He did show considerable decline in the thrombocyte count  from 399 to 246 off HAART medicine.  His thrombocyte count needs to be  checked on Wednesday during his clinic appointment and need to decide  further regarding this matter.  Problem 5. Electrolytes abnormality.  Hypokalemia most likely secondary to  magnesium deficiency and  nausea and vomiting.  The magnesium was repleted,  potassium was also repleted.   Problem 6. Hypomagnesemia.  Repleted with IV magnesium sulfate.  Rechecked  his magnesium level which was normal at 2.1 on the day of discharge.   Problem 7. Abdominal wound.  Wound consult was done during his hospital stay  and he got regular wound VAC changes during his hospital stay.  He already  has home health care arrangement for this purpose at home.   DISCHARGE:  Vital signs:  Temperature 98.3, pulse 85, respiratory rate 18,  blood pressure 134/94.  SPO2 98% on room air.  I's and O's 2470- __________  positive 370.   DISCHARGE LABORATORY:  CBC showed hemoglobin 9.3, white count 10.1,  thrombocytes 246.  BMET showed sodium 138, potassium 3.7, chloride 104,  bicarbonate 26, BUN 4, creatinine 0.9 and glucose 87.  Magnesium 2.1,  phosphorus 4.4, calcium 8.7.  Blood cultures x2 negative.  C. difficile x2  negative.   DISCHARGE INSTRUCTIONS:  The patient has been instructed to following  instructions.  To discontinue taking his HAART medicine and Fentanyl patch  until he sees Dr. Lina Sayre.  Keep his appointment with Dr. Maurice March on  Wednesday at 3 p.m.      SY/MEDQ  D:  05/10/2004  T:  05/10/2004  Job:  [id number]   cc:   Inez Catalina, M.D.  [address] [place]  [place] [address]  Fax: [phone]   Dr .[facility]

## 2010-06-18 NOTE — Op Note (Signed)
NAME:  KARANVIR, BALDERSTON NO.:  [id number]   MEDICAL RECORD NO.:  [id number]          PATIENT TYPE:  INP   LOCATION:  2112                         FACILITY:  [facility]   PHYSICIAN:  Gabrielle Dare. Janee Morn, M.D.DATE OF BIRTH:  06/10/1945   DATE OF PROCEDURE:  03/26/2004  DATE OF DISCHARGE:                                 OPERATIVE REPORT   PREOPERATIVE DIAGNOSIS:  Open abdomen, status post sigmoid colectomy.   POSTOPERATIVE DIAGNOSES:  1.  Open abdomen, status post sigmoid colectomy.  2.  Abdominal fluid collection, left upper quadrant and pelvis.   PROCEDURE:  Exploratory laparotomy with washout and change of open abdomen  vacuum-assisted closure dressing.   SURGEON:  Gabrielle Dare. Janee Morn, M.D.   ANESTHESIA:  General.   HISTORY OF PRESENT ILLNESS:  The patient is a 65 year old white male with a  history of HIV, who presented to an outside hospital with an acute abdomen.  He had a sigmoid perforation and underwent sigmoid colectomy and Hartmann's  procedure there, but then developed worsening overwhelming sepsis and was  transferred to [facility] on the Critical Care Medicine Service.  He was successfully resuscitated and treated for his sepsis, went on to be  extubated on March 24, 2004, but due to some respiratory difficulty,  subsequently developed dehiscence of his wound and was taken to the  operating room that night and an open abdomen V.A.C. dressing was applied.  He presents today for reexploration, washout and change of his open abdomen  dressing.   PROCEDURE IN DETAIL:  Informed consent was obtained from the patient's  family.  He was identified, brought straight from the intensive care unit to  the operating room on the ventilator.  He was receiving intravenous  antibiotics.  General anesthesia was administered.  The open abdomen V.A.C.  and colostomy appliance were removed.  His abdomen was prepped and draped in  a sterile fashion.  The colostomy  was kept separate from the wound using  Steri-Drapes.  His abdomen was explored.  There were some adhesions amongst  his small bowel; no interloop abscesses were noted.  He had some cloudy  fluid up in his left upper quadrant which was evacuated and in addition,  there was some cloudy fluid remaining in his pelvis that was also evacuated.  His abdomen was copiously irrigated with over 3 L of warm saline until all  irrigation returned clear.  His right upper quadrant and right lower  quadrant had no significant fluid collections present.  Once he was  adequately washed out, his fascia was inspected and noted to be viable  without any areas of significant necrosis.  At this time, his abdomen was  closed with the open abdomen V.A.C. dressing after ensuring there was no  bleeding or other fluid collection.  For the first step, we applied the  fenestrated clear drape with built-in sponge and that wrapped around the  entirety of his abdominal intestinal content and tucked down into both  gutters circumferentially with care to protect his colostomy.  Once that was  applied, 2 overlying black sponges were cut to size and  placed and then  affixed to the skin with the clear drapes that come with the V.A.C. system.  The V.A.C. suction attachment was placed and suction was applied with an  excellent seal.  Some additional patches were placed for skin protection  around the colostomy site and a new ostomy appliance was placed.  The  patient tolerated the procedure well without apparent complication.  Sponge,  needle and instrument counts were correct.  He was returned to the intensive  care unit in critical, but stable condition.      BET/MEDQ  D:  03/26/2004  T:  03/27/2004  Job:  [id number]

## 2010-06-18 NOTE — Discharge Summary (Signed)
[place]  Patient:    Rodney Herrera, Rodney Herrera Visit Number: [id number] MRN: [id number]          Service Type: MED Location: 5000 [id number] Attending Physician:  Edwyna Perfect Dictated by:   Jerene Canny Admit Date:  08/06/2001 Discharge Date: 08/09/2001                             Discharge Summary  INCOMPLETE  DATE OF BIRTH:  12-Oct-1945  ADMISSION DIAGNOSIS:  Chronic diarrhea.  DISCHARGE DIAGNOSES:  1. Clostridium difficile.  2. Depression.  PAST MEDICAL HISTORY:  1. Human immunodeficiency virus positive.  2. History of diverticulitis.  3. Left colon resection on July 16, 2001, for the recurrent diverticulitis.  4. Cervical neck surgery.  5. Pancreatic mass.  6. Arthroscopic knee surgery.  7. Bursitis.  8. Right inguinal hernia with repair.  9. Skull fracture. 10. Shingles.  PROCEDURES PERFORMED:  None.  BRIEF HISTORY:  The patient is a 65 year old man with HIV diagnosed in 2000 with his last CD4 count being 530 and his viral load being 1000 dated May 2003 with a three bout history of diverticulitis in this past year and he had recent surgery of his left colon, resection two and a half weeks prior to admission at [facility]. The pathology of the left colon excision was significant for diverticulitis with pericolic abscess formation. On July 23, 2001, the patient had a percutaneous CT-guided drain of the pelvic fluid collection. The patient was afebrile postoperatively and was placed on a prophylactic antibiotic, and then he was discharged to home on the antibiotics. The patient complained of diarrhea preoperatively and postoperatively and had some nausea and vomiting postoperatively which was controlled by Phenergan.  The patient was able to leave [facility] on August 05, 2001. On the following day, August 06, 2001, the patient called Dr. Lennon Alstrom, the surgeon, and complained of increasing diarrhea,  nausea and vomiting and subjective fevers. He was told by Dr. Lennon Alstrom to come to [facility] to be seen by Dr. Maurice March for possible infectious disease cause.  HOSPITAL COURSE:  On arrival to the emergency department on August 07, 2001, the patient had a fever of 103. His blood pressure was 137/90 and he failed orthostatics and was tachycardic. He was in mild distress and had p.o. in anticipation of a CT scan of the abdomen with oral contrast. The following morning we obtained stool samples for C. difficile which was negative. The patient was afebrile during the course of the remainder of his hospital stay and we repeated a C. difficile on August 09, 2001, with a positive result. The patient was started on IV _____ Dictated by:   Jerene Canny Attending Physician:  Edwyna Perfect DD:  08/09/01 TD:  08/13/01 Job: [id number]

## 2010-06-18 NOTE — Op Note (Signed)
NAME:  Rodney Herrera, LEFKOWITZ NO.:  [id number]   MEDICAL RECORD NO.:  [id number]          PATIENT TYPE:  INP   LOCATION:  2112                         FACILITY:  [facility]   PHYSICIAN:  Ollen Gross. Vernell Morgans, M.D. DATE OF BIRTH:  Jun 06, 1945   DATE OF PROCEDURE:  03/30/2004  DATE OF DISCHARGE:                                 OPERATIVE REPORT   PREOPERATIVE DIAGNOSES:  Open abdomen.   POSTOPERATIVE DIAGNOSES:  Open abdomen.   OPERATION PERFORMED:  Abdominal VAC change.   SURGEON:  Ollen Gross. Carolynne Edouard, M.D.   ASSISTANT:  Gabrielle Dare. Janee Morn, M.D.   ANESTHESIA:  General endotracheal.   DESCRIPTION OF PROCEDURE:  After informed consent was obtained, the patient  was brought to the operating room and placed in supine position on the  operating table.  After adequate induction of general anesthesia, the  patient's old vacuum dressing was removed.  The patient's abdomen was  prepped with Betadine and draped in the usual sterile manner.  The abdomen  was irrigated with copious amounts of saline.  The abdomen was fairly clean  and the bowel brain type adhesions were beginning to form. The abdomen was  fairly stuck at this point.  The lower portion of the wound was further  pulled together with several interrupted #1 Novofil stitches but the wound  could not be completely closed all the way.  A new vacuum dressing was then  applied to the open portion of the abdominal wound and placed to suction.  The patient tolerated the procedure well.  At the end of the case, all  sponge, needle and instrument counts were correct.  The patient was then  taken back to the intensive care unit intubated for further monitoring.      PST/MEDQ  D:  04/04/2004  T:  04/05/2004  Job:  [id number]

## 2010-06-18 NOTE — Discharge Summary (Signed)
NAME:  Rodney Herrera, Rodney Herrera NO.:  [id number]   MEDICAL RECORD NO.:  [id number]          PATIENT TYPE:  INP   LOCATION:  5739                         FACILITY:  [facility]   PHYSICIAN:  Fransisco Hertz, M.D.  DATE OF BIRTH:  04/03/1945   DATE OF ADMISSION:  03/20/2004  DATE OF DISCHARGE:                                 DISCHARGE SUMMARY   __________   DISCHARGE DIAGNOSES:  1.  Resolved adult respiratory distress syndrome.  2.  Total colectomy/ileostomy/wound dehiscence.  3.  Hypotensive shock.  4.  Human immunodeficiency virus.  5.  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  6.  Pancreatic mass.  7.  Depression/anxiety.  8.  Hyperlipidemia.  9.  Chronic nausea.  10. Anemia.  11. Delirium/altered mental status.  12. Metabolic acidosis.   DISCHARGE MEDICATIONS:  1.  Xanax 0.5 mg p.o. q.8h. p.r.n.  2.  Fentanyl 50 mcg transdermal q.72h.  3.  Protonix 40 mg p.o. b.i.d.  4.  Tylenol 650 mg p.o. q.6h. p.r.n. pain or fever.  5.  Albuterol 2.5 mg inhaled q.6h. p.r.n.  6.  Atrovent 0.5 mg inhaled q.6h. p.r.n.  7.  Percocet two tablets p.o. q.6h. p.r.n. pain.  8.  Phenergan 12.5 mg p.o. q.6h. p.r.n. nausea.  9.  Mylanta 80 mg p.o. q.i.d. p.r.n.   PROCEDURES PERFORMED:  1.  Multiple chest x-rays (#17) - following resolving ARDS and placement of      PICC line, last chest x-ray on April 13, 2004, showed stable left lower      lobe atelectasis versus scarring.  2.  Abdominal CT with contrast on April 14, 2004, is pending at this time.  3.  Patient went to the OR on March 24, 2004, for fascial debridement      secondary to wound dehiscence and have a wound VAC placed.  4.  On March 26, 2004, the patient returned to the OR for exploratory      laparotomy with washout and vacuum change for abdominal wound care.  5.  On March 30, 2004, the patient returned to the OR for a wound VAC      change.  6.  On April 05, 2004, the patient returned  for wound closure with a 6 x 12      cm AlloDerm and VAC placement and change.   CONSULTATIONS:  1.  Cherylynn Ridges, M.D., general surgery.  2.  Marcelyn Bruins, M.D., critical care medicine.   ADMISSION HISTORY AND PHYSICAL:  Patient is a 65 year old white male with  history of HIV, recurrent diverticulitis, and exploratory laparotomy for  acute abdomen and colostomy at [facility] on March 18, 2004.  The  patient was transferred to [facility] secondary to  acute respiratory failure and vent requirement suggestive developing ARDS.  Patient had presented to the outside hospital with perforation and underwent  a total colectomy with colostomy.  He was then transferred to our service  intubated and requiring vent.   ADMISSION PHYSICAL EXAMINATION:  GENERAL APPEARANCE:  Sedated on vent.  VITAL SIGNS:  Pulse 109, respiratory rate 14, vent settings  tidal volume  800, PEEP 5, 96% on 100% FIO2.  Temperature 102.3.  LUNGS:  Clear to auscultation bilaterally.  CARDIOVASCULAR:  Tachycardic with prominent S1 and S2, no murmurs, rubs, or  gallops.  ABDOMEN:  No bowel sounds.  Midline incision appeared clean, dry and intact.  Left colostomy necrotic in appearance with positive blood in colostomy bag.  EXTREMITIES:  Warm and well perfused.  GU:  Foley was in place.  SKIN:  Unremarkable.  NEUROLOGIC:  Appeared neurologically intact, however, sedated.   ADMISSION LABORATORY DATA:  Remarkable for chemistries with sodium 132,  potassium 2.7, chloride 102, CO2 2, BUN 13, creatinine 0.9, glucose 104,  alkaline phosphatase 38, AST 47, ALT 19, protein 3.3, albumin 1.3, calcium  6.1.  PT 15.6, PTT 44.  White count 12, ANC 11.3, hemoglobin 8.5, platelets  83.  The UA showed some blood, positive protein.  The urine creatinine was  90.3, urine sodium 126, erythrocyte sed rate of 49.  BNP 130.4.   HOSPITAL COURSE:  PROBLEM #1 -  ADULT RESPIRATORY DISTRESS SYNDROME:  Patient was  intubated on admission with chest x-ray consistent for ARDS.  ABGs improved and patient was successfully weaned and extubated on March 24, 2004, however, patient was required to be reintubated on March 24, 2004, when he went to the OR for abdominal dehiscence.  He was then kept on  the vent until he was re-extubated on April 03, 2004.  On April 06, 2004, he  was transferred to the floor and was stable on room air and no longer  requiring vent support.   PROBLEM #2 -  COLECTOMY/ILEOSTOMY/WOUND DEHISCENCE:  The patient had a  general surgery consult on March 20, 2004, for wound management.  He was  placed on bowel rest and total parenteral nutrition.  On March 24, 2004,  there were some faint bowel sounds heard and the colostomy remained viable;  however, that evening his bowel became distended and general surgery was  reconsulted.  The patient was taken to the OR for wound dehiscence and  necrosis.  A wound VAC was placed and he returned to the OR on three  separate occasions following for wash out and vacuum change.   PROBLEM #3 -  SEPSIS:  The patient was placed on vancomycin and Zosyn at  admission secondary to bowel perforation.  Per outside hospital records, the  patient was hypotensive and required pressors for several hours prior to  transfer to [facility].  During this hospitalization,  he has remained normotensive and has not required pressors.  He had blood  cultures and urine cultures which were negative x2 and the vancomycin and  Zosyn was discontinued.  On April 13, 2004, the patient had a low grade  fever and continued to have low grade fever on April 14, 2004.  An abdominal  CT was obtained to rule out an abdominal/pelvic abscess and the results of  this study are pending at this time.   PROBLEM #4 -  HUMAN IMMUNODEFICIENCY VIRUS:  The patient had a CD4 count on October 2005, of 650 with undetectable viral load.  He has done well on his   antiretrovirals and will be restarted on these medications as an outpatient.  He was on regimen of Epzicom and Viramune.   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  His  last platelet count was 367 on April 14, 2004.  We will likely discontinue  his steroid therapy as an outpatient and have him follow up with this  concern as an outpatient.   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 Rise Mu of [facility] and there is nothing to do for this complaint at this time.   PROBLEM #7 -  DEPRESSION/ANXIETY:  The patient has high baseline anxiety and  has used Xanax during this hospitalization on the floor which controls it  relatively well.  He has been on Remeron in the past with good results but  is not interested in continuing that at this time.  He has good family  support from both his children, as well as friends in the area.   PROBLEM #8 -  HYPERLIPIDEMIA:  The patient's last cholesterol panel showed a  total cholesterol of 136 with triglycerides of 144.  In light of the  patient's TPN use, it would be difficulty to interpret these and he will  likely need follow-up as an outpatient for further cholesterol screening and  management.   PROBLEM #9 -  CHRONIC NAUSEA:  The patient has used Zofran and Phenergan  p.r.n. during this hospitalization with good results.   PROBLEM #10 -  ANEMIA:  The patient has had low but stable H&H last reading  9.5 and 28.4, the etiology of this is unknown but is likely secondary to  chronic disease.   PROBLEM #11 -  ALTERED MENTAL STATUS:  While in the ICU, the patient was  agitated, required Haldol p.r.n.  and was confused.  Since returning to the  floor, the patient has had resolution of his mental status and is back to  baseline.  Nothing further to do for this concern.   PROBLEM #12 -  METABOLIC ACIDOSIS:  While the patient was in the ICU, he did  have metabolic acidosis likely secondary to his NG tube and Lasix.  This has  since resolved.   PROBLEM #13 -  GLUCOSE CONTROL:  Due to the patient being on hydrocortisone  during this hospitalization, he has required sliding scale insulin and  Lantus for blood sugar control.  As he will likely not be going out on these  steroids, we do not expect him to need further blood sugar management as an  outpatient.   PROBLEM #14 -  WOUND CARE:  The patient has been seen by the wound consult  team here for care of his wound VAC as well as the nursing support team  regarding his ostomy care.  He will have home health at discharge in order to help him care for his wound VAC and he has received the appropriate  training.   DISCHARGE LABORATORY DATA:  Chemistries:  Sodium 135, potassium 4.0,  chloride 101, CO2 27, BUN 13, creatinine 0.9, glucose 96, calcium 8.6.  UA  within normal limits.  White count 16.4, H&H 9.5 and 28.4, platelets 367.  Blood cultures drawn on April 13, 2004, showed no growth to date from the  peripheral culture and gram  positive cocci and chains from the peripheral PEG which we thought was  likely secondary to overgrowth of the PEG versus contamination.   NOTE:  Anticipate discharge of patient on April 17, 2004.  Please see  addendum to follow.      /MEDQ  D:  04/14/2004  T:  04/14/2004  Job:  [id number]   cc:   Cherylynn Ridges III, M.D.  [address], [address]  [place]  [place] [address]   Marcelyn Bruins, M.D. [facility]

## 2010-06-18 NOTE — Op Note (Signed)
NAME:  Rodney Herrera, Rodney Herrera NO.:  [id number]   MEDICAL RECORD NO.:  [id number]          PATIENT TYPE:  INP   LOCATION:  2112                         FACILITY:  [facility]   PHYSICIAN:  Cherylynn Ridges III, M.D.DATE OF BIRTH:  09-05-1945   DATE OF PROCEDURE:  03/24/2004  DATE OF DISCHARGE:                                 OPERATIVE REPORT   PREOPERATIVE DIAGNOSIS:  Fascial dehiscence with evisceration.   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   PROCEDURE:  Fascial debridement with placement of abdominal VAC dressing.   SURGEON:  Cherylynn Ridges, M.D.   ASSISTANT:  Angelia Mould. Derrell Lolling, M.D.   ANESTHESIA:  General endotracheal.   ESTIMATED BLOOD LOSS:  Less than 50 cc.   COMPLICATIONS:  None.   CONDITION:  Critical.   SPECIMENS:  None.   FINDINGS:  The patient had marked fascial necrosis with at least a 4-inch  separation between the sides of the fascia in the midportion and significant  fenestration of the abdominal wall fascia with continued necrotic tissue.   INDICATIONS FOR PROCEDURE:  The patient is a 65 year old male with a  previous abdominal laparotomy for perforated bowel, who had a colostomy and  a colectomy. He was extubated today and this evening developed worsening  abdominal distention and under his dressing was found to have a dehiscence  with evisceration.   OPERATION:  The patient was taken to the operating room and placed on table  in supine position. After an adequate endotracheal anesthetic was  administered, we draped out the colostomy after removing the bag.  It was  not draining much stool.  Then we prepped and draped the abdomen with  Betadine in the usual sterile manner.   We explored the abdomen to look for any pockets of infected material and  none were noted. The edges of the fascia on both sides were markedly  necrotic and ischemic. There was minimal bleeding from the rectus muscle  itself  as we debrided back to where the sutures were pulled through  completely in the upper to the midportion of the wound.  About two thirds to  three quarters of the wound had dehisced. The bottom sutures were left in  place, but were stretched through what appeared to be pieces of fascia that  were torn.   We irrigated with saline solution after we had removed all the necrotic  fascia and the sutures that were caught in that necrotic fascia. We then  placed an abdominal VAC dressing in the standard manner using the plastic  fenestrated black foam and applied directly next to bowel with two layers of  black foam on top. Prior to doing so, we had to actually put a two-piece  ostomy device on the stoma, which came out the left upper quadrant. We were  able to leave about an inch and a half to two 2 inches of space between the  incisional edge at the stoma itself in order for the [facility] dressing to come  down tightly. This was done so we had to apply the VAC compliance with  minimal difficulty. Excellent suction and good placement.  The patient was  then taken back to the ICU in stable condition but critical condition.      JOW/MEDQ  D:  03/24/2004  T:  03/25/2004  Job:  [id number]

## 2012-08-15 ENCOUNTER — Telehealth: Payer: Self-pay

## 2012-08-15 NOTE — Telephone Encounter (Signed)
Medical records received. Patient is requesting appointment.  Last labs done in May and he has requested to see physician first.   Appointment given .  Laurell Josephs, RN

## 2012-08-23 ENCOUNTER — Telehealth: Payer: Self-pay | Admitting: *Deleted

## 2012-08-23 NOTE — Telephone Encounter (Signed)
i called Rodney Herrera back to see if he had gotten an emergency supply of Truvada.  He said he got it last month.  He will not qualify again.  He is coming in on Tuesday and hopefully we can get him his meds then.

## 2012-08-28 ENCOUNTER — Ambulatory Visit: Payer: Self-pay

## 2012-08-28 ENCOUNTER — Encounter: Payer: Self-pay | Admitting: Internal Medicine

## 2012-08-28 ENCOUNTER — Ambulatory Visit (INDEPENDENT_AMBULATORY_CARE_PROVIDER_SITE_OTHER): Payer: Medicare Other | Admitting: Internal Medicine

## 2012-08-28 VITALS — BP 157/83 | HR 69 | Temp 97.9°F | Ht 71.0 in | Wt 159.0 lb

## 2012-08-28 DIAGNOSIS — B20 Human immunodeficiency virus [HIV] disease: Secondary | ICD-10-CM

## 2012-08-28 DIAGNOSIS — E785 Hyperlipidemia, unspecified: Secondary | ICD-10-CM

## 2012-08-28 DIAGNOSIS — F329 Major depressive disorder, single episode, unspecified: Secondary | ICD-10-CM

## 2012-08-28 MED ORDER — DOLUTEGRAVIR SODIUM 50 MG PO TABS: 50.0000 mg | ORAL_TABLET | Freq: Every day | ORAL | Status: AC

## 2012-08-28 MED ORDER — ABACAVIR SULFATE-LAMIVUDINE 600-300 MG PO TABS: 1.0000 | ORAL_TABLET | Freq: Every day | ORAL | Status: DC

## 2012-08-28 MED ORDER — ALPRAZOLAM 0.5 MG PO TABS: 0.5000 mg | ORAL_TABLET | Freq: Every day | ORAL | Status: DC

## 2012-08-28 MED ORDER — GABAPENTIN 300 MG PO CAPS: 300.0000 mg | ORAL_CAPSULE | Freq: Three times a day (TID) | ORAL | Status: AC

## 2012-08-28 NOTE — Assessment & Plan Note (Signed)
I am going to change his regimen to tivicay and Epzicom do to his renal insufficiency. Though he requested a one daily tab, with his renal insufficiency and hesitant to give him Stribild or even Atripla one that he may need dose adjustments in the near future. I will check his HLA test with his next lab visit though low risk based on his race. Therefore I will start his regimen prior to getting the test rather than have him remain off medicines prior to that. He will get labs in about one month after restarting his regimen and she has been off for about a week. I will then see him in 6 weeks.

## 2012-08-28 NOTE — Assessment & Plan Note (Signed)
He previously was on Lipitor though improved and has been off it. I will have him referred for a primary doctor who can help manage this and

## 2012-08-28 NOTE — Assessment & Plan Note (Signed)
I am going to have him referred to our counselor in psychiatry.I did give him a one-time refill of his Xanax. He understands that this is one-time only and he will otherwise need to be under the care of a psychiatrist for further refills. He is in agreement with this and actually is interested in weaning off of it.

## 2012-08-28 NOTE — Progress Notes (Signed)
  Subjective:    Patient ID: Rodney Herrera, male    DOB: 1945-10-20, 67 y.o.   MRN: [id number]  HPI She comes in to reestablish care. He previously was seen here in 2011 but moved to [place] where he has been on Isentress and Truvada. He was previously on this prior to leaving as well and has been tolerating it well. He is a long history of HIV from the 1990s and had poor tolerance of medications until he started on Atripla when it first came out and was on that until changing to his current regimen. He has a history of intestinal rupture and surgery in 2006. His previous partner developed lymphoma and died when he was initially diagnosed. He also has anxiety for which he takes Xanax, has a history of diverticulitis and thrombocytopenia though did not require spleen removal. He reports general excellent compliance with his medications and no missed doses. In review of his history, he has had a GFR that is right around 50. He is concerned with long-term effects of medications.     Review of Systems  Constitutional: Negative for fever, activity change, appetite change, fatigue and unexpected weight change.  HENT: Negative for sore throat and mouth sores.   Eyes: Negative for visual disturbance.  Respiratory: Negative for cough and shortness of breath.   Cardiovascular: Negative for chest pain and leg swelling.  Gastrointestinal: Negative for nausea, vomiting, abdominal pain and diarrhea.  Genitourinary: Negative for genital sores.  Musculoskeletal: Negative for myalgias, joint swelling and arthralgias.  Skin: Negative for rash.  Neurological: Negative for dizziness, light-headedness and headaches.  Hematological: Negative for adenopathy.  Psychiatric/Behavioral: Negative for dysphoric mood.       Objective:   Physical Exam  Constitutional: He is oriented to person, place, and time. He appears well-developed and well-nourished. No distress.  HENT:  Mouth/Throat: Oropharynx is clear and moist.  No oropharyngeal exudate.  Eyes: Right eye exhibits no discharge. Left eye exhibits no discharge. No scleral icterus.  Cardiovascular: Normal rate, regular rhythm and normal heart sounds.   No murmur heard. Pulmonary/Chest: Effort normal and breath sounds normal. No respiratory distress. He has no wheezes.  Abdominal: Soft. Bowel sounds are normal. He exhibits no distension. There is no tenderness.  Lymphadenopathy:    He has no cervical adenopathy.  Neurological: He is alert and oriented to person, place, and time.  Skin: Skin is warm and dry. No rash noted.  Psychiatric: He has a normal mood and affect. His behavior is normal.  He did become tearful during the interview          Assessment & Plan:

## 2012-09-04 ENCOUNTER — Encounter: Payer: Self-pay | Admitting: Internal Medicine

## 2012-09-05 ENCOUNTER — Other Ambulatory Visit: Payer: Self-pay

## 2012-09-27 ENCOUNTER — Other Ambulatory Visit (INDEPENDENT_AMBULATORY_CARE_PROVIDER_SITE_OTHER): Payer: Medicare Other

## 2012-09-27 ENCOUNTER — Other Ambulatory Visit (HOSPITAL_COMMUNITY)
Admission: RE | Admit: 2012-09-27 | Discharge: 2012-09-27 | Disposition: A | Discharge status: Home / Self Care | Payer: Medicare Other | Source: Ambulatory Visit | Attending: Internal Medicine | Admitting: Internal Medicine

## 2012-09-27 DIAGNOSIS — Z79899 Other long term (current) drug therapy: Secondary | ICD-10-CM

## 2012-09-27 DIAGNOSIS — B20 Human immunodeficiency virus [HIV] disease: Secondary | ICD-10-CM

## 2012-09-27 DIAGNOSIS — Z113 Encounter for screening for infections with a predominantly sexual mode of transmission: Secondary | ICD-10-CM

## 2012-09-27 LAB — COMPLETE METABOLIC PANEL WITH GFR
AST: 14 U/L (ref 0–37)
Alkaline Phosphatase: 81 U/L (ref 39–117)
BUN: 21 mg/dL (ref 6–23)
Calcium: 9.6 mg/dL (ref 8.4–10.5)
Creat: 1.66 mg/dL — ABNORMAL HIGH (ref 0.50–1.35)
GFR, Est Non African American: 42 mL/min — ABNORMAL LOW

## 2012-09-27 LAB — CBC WITH DIFFERENTIAL/PLATELET
Eosinophils Relative: 8 % — ABNORMAL HIGH (ref 0–5)
HCT: 41 % (ref 39.0–52.0)
Lymphocytes Relative: 26 % (ref 12–46)
Lymphs Abs: 1.3 10*3/uL (ref 0.7–4.0)
MCV: 89.7 fL (ref 78.0–100.0)
Neutro Abs: 2.5 10*3/uL (ref 1.7–7.7)
Platelets: 198 10*3/uL (ref 150–400)
RBC: 4.57 MIL/uL (ref 4.22–5.81)
WBC: 4.7 10*3/uL (ref 4.0–10.5)

## 2012-09-27 LAB — LIPID PANEL
Cholesterol: 215 mg/dL — ABNORMAL HIGH (ref 0–200)
Triglycerides: 237 mg/dL — ABNORMAL HIGH (ref <150)
VLDL: 47 mg/dL — ABNORMAL HIGH (ref 0–40)

## 2012-09-28 LAB — HIV-1 RNA QUANT-NO REFLEX-BLD: HIV 1 RNA Quant: <20 copies/mL (ref <20)

## 2012-09-28 LAB — T-HELPER CELL (CD4) - (RCID CLINIC ONLY)
CD4 % Helper T Cell: 29 % — ABNORMAL LOW (ref 33–55)
CD4 T Cell Abs: 330 /uL — ABNORMAL LOW (ref 400–2700)

## 2012-10-04 LAB — HLA B*5701: HLA-B*5701: POSITIVE — AB

## 2012-10-11 ENCOUNTER — Ambulatory Visit (INDEPENDENT_AMBULATORY_CARE_PROVIDER_SITE_OTHER): Payer: Medicare Other | Admitting: Internal Medicine

## 2012-10-11 ENCOUNTER — Encounter: Payer: Self-pay | Admitting: Internal Medicine

## 2012-10-11 VITALS — BP 147/96 | HR 80 | Temp 98.2°F | Ht 70.0 in | Wt 155.0 lb

## 2012-10-11 DIAGNOSIS — Z23 Encounter for immunization: Secondary | ICD-10-CM

## 2012-10-11 DIAGNOSIS — B20 Human immunodeficiency virus [HIV] disease: Secondary | ICD-10-CM

## 2012-10-11 MED ORDER — EMTRICITABINE-TENOFOVIR DF 200-300 MG PO TABS: 1.0000 | ORAL_TABLET | ORAL | Status: AC

## 2012-10-11 NOTE — Progress Notes (Signed)
  Subjective:    Patient ID: Rodney Herrera, male    DOB: [birth year], 67 y.o.   MRN: [id number]  HPI He comes in for routine followup. This is his second visit after returning to the area. He previously was cared for by Dr. Maurice March. He previously was on [facility] but do to his renal insufficiency, this was changed to Epzicom and tivicay. Unfortunately, he is HLA positive and therefore unable to take abacavir. His CD4 count is 330 which is lower than 400 it was previously and he tells me it is generally over 500. His viral load though has remained undetectable. He takes the medication well and has no problems. He continues to struggle with except in the diagnosis though it has been several years. He did recently stop Xanax since he does not feel he needs to see psychiatry and therefore does not feel he needs to keep taking the Xanax. He again does continue to have difficulty accepting his diagnosis though denies any suicidal ideation.   Review of Systems  Constitutional: Negative for fever, appetite change and fatigue.  HENT: Negative for sore throat and trouble swallowing.   Eyes: Negative for visual disturbance.  Respiratory: Negative for cough and shortness of breath.   Cardiovascular: Negative for chest pain and leg swelling.  Gastrointestinal: Positive for nausea. Negative for vomiting, abdominal pain and diarrhea.  Musculoskeletal: Negative for myalgias and arthralgias.  Skin: Negative for rash.  Neurological: Negative for dizziness, light-headedness and headaches.  Hematological: Negative for adenopathy.  Psychiatric/Behavioral: Positive for dysphoric mood. Negative for suicidal ideas and sleep disturbance. The patient is nervous/anxious.        Objective:   Physical Exam  Constitutional: He is oriented to person, place, and time. He appears well-developed and well-nourished. No distress.  HENT:  Mouth/Throat: No oropharyngeal exudate.  Eyes: Right eye exhibits no  discharge. Left eye exhibits no discharge. No scleral icterus.  Cardiovascular: Normal rate, regular rhythm and normal heart sounds.   No murmur heard. Pulmonary/Chest: Breath sounds normal. He is in respiratory distress.  Lymphadenopathy:    He has no cervical adenopathy.  Neurological: He is alert and oriented to person, place, and time.  Skin: Skin is warm and dry. No rash noted.  Psychiatric:  Anxious and tearful          Assessment & Plan:

## 2012-10-11 NOTE — Assessment & Plan Note (Signed)
He is a well, unfortunately with his HLA test, I will need to change him. I did discuss different options and he has opted for tivicay and every other day Truvada. He feels he prefers this to 2 last pill burden and he does not have any problem taking something every other day. We'll recheck his labs in 2 months.  25 minutes was spent with face to face contact including counseling of anxiety and medications and 45 minutes spent on the appointment.

## 2012-11-22 ENCOUNTER — Encounter: Payer: Self-pay | Admitting: Licensed Clinical Social Worker

## 2012-12-03 ENCOUNTER — Telehealth: Payer: Self-pay

## 2012-12-03 NOTE — Telephone Encounter (Signed)
Pt calling to cancel appointments since he is getting married and moving to [place].  Appts. cancelled.   Laurell Josephs, RN

## 2012-12-11 ENCOUNTER — Other Ambulatory Visit: Payer: Medicare Other

## 2012-12-13 ENCOUNTER — Telehealth: Payer: Self-pay | Admitting: *Deleted

## 2012-12-13 NOTE — Telephone Encounter (Signed)
Pt wanting to take records with him to [place] to establish care there.  Getting married and moving to [place].

## 2012-12-25 ENCOUNTER — Ambulatory Visit: Payer: Medicare Other | Admitting: Internal Medicine

## 2013-01-15 ENCOUNTER — Ambulatory Visit: Payer: Medicare Other | Admitting: Internal Medicine

## 2013-02-14 ENCOUNTER — Telehealth: Payer: Self-pay | Admitting: *Deleted

## 2013-02-14 NOTE — Telephone Encounter (Signed)
Patient transferring care, received signed ROI.  Placed in medical records [facility] folder for fulfillment. Rodney Herrera, Tennyson Wacha M, RN

## 2014-03-14 ENCOUNTER — Ambulatory Visit (HOSPITAL_COMMUNITY): Payer: Self-pay | Admitting: Vascular Surgery

## 2015-10-19 ENCOUNTER — Inpatient Hospital Stay (EMERGENCY_DEPARTMENT_HOSPITAL)
Admission: EM | Admit: 2015-10-19 | Discharge: 2015-10-19 | Disposition: A | Discharge status: Home / Self Care | Payer: Non-veteran care

## 2015-10-19 DIAGNOSIS — S3021XA Contusion of penis, initial encounter: Secondary | ICD-10-CM

## 2015-10-19 DIAGNOSIS — A6001 Herpesviral infection of penis: Secondary | ICD-10-CM

## 2015-10-19 DIAGNOSIS — N485 Ulcer of penis: Secondary | ICD-10-CM

## 2018-02-27 ENCOUNTER — Ambulatory Visit (INDEPENDENT_AMBULATORY_CARE_PROVIDER_SITE_OTHER): Payer: Self-pay | Admitting: PHYSICIAN ASSISTANT

## 2018-03-13 ENCOUNTER — Ambulatory Visit (INDEPENDENT_AMBULATORY_CARE_PROVIDER_SITE_OTHER): Payer: Self-pay | Admitting: PHYSICIAN ASSISTANT

## 2018-03-19 ENCOUNTER — Encounter (INDEPENDENT_AMBULATORY_CARE_PROVIDER_SITE_OTHER): Payer: Self-pay | Admitting: PHYSICIAN ASSISTANT

## 2018-03-19 ENCOUNTER — Ambulatory Visit (INDEPENDENT_AMBULATORY_CARE_PROVIDER_SITE_OTHER): Payer: 59 | Admitting: PHYSICIAN ASSISTANT

## 2018-03-19 ENCOUNTER — Other Ambulatory Visit: Payer: Self-pay

## 2018-03-19 ENCOUNTER — Encounter (INDEPENDENT_AMBULATORY_CARE_PROVIDER_SITE_OTHER): Payer: Self-pay

## 2018-03-19 VITALS — Ht 71.0 in | Wt 182.5 lb

## 2018-03-19 DIAGNOSIS — M25562 Pain in left knee: Secondary | ICD-10-CM

## 2018-03-19 NOTE — Progress Notes (Signed)
[facility]  [address] [place]  [place] [address]    Progress Note    Name: [name] MRN:  [id number]   Date: 03/19/2018 Age: 73 y.o.         Chief Complaint: New Patient (Left knee pain. Patient states that he had an MRI and XR at the [place] 2 to 3 months. Denies injury. Patient states that he was going down stairs and his knee wanted to go to the left and his knee started to hurt. Patient states that his knee felt weak last summer. Patient's pain level 0/10, but it can go to 10/10. Using knee brace when playing golf. Not using medications. Used ice)      HPI: [name] is a 73 y.o. male presenting for left knee pain.  New x-rays performed today.  No known injury.  Patient has had ongoing pain for over 6 months.  He did have a time when he was going down the stairs and had severe pain for several days.  That has since subsided.  He does not have constant pain in the knee.  He has intermittent twinges that occur mainly with pivoting and with stairs.  He does have some instability at times.  He has been using a knee brace to play golf which has helped tremendously.  No history of injections.    Medical History     Past Medical History  Current Outpatient Medications   Medication Sig   . amlodipine besylate (AMLODIPINE ORAL) Take 5 mg by mouth Once a day   . atorvastatin (LIPITOR) 20 mg Oral Tablet Every one hour   . elviteg-cob-emtri-tenof ALAFEN (GENVOYA) 150-150-200-10 mg Oral Tablet Every one hour   . gabapentin (NEURONTIN) 300 mg Oral Capsule Take 1 Tab by mouth Three times a day   . levothyroxine (SYNTHROID) 75 mcg Oral Tablet levothyroxine   0.075mg    . OMEPRAZOLE ORAL 20 mg     No Known Allergies  Past Medical History:   Diagnosis Date   . Esophageal reflux    . High cholesterol    . HTN (hypertension)    . Hypothyroidism          Past Surgical History:   Procedure Laterality Date   . COLON SURGERY     . HX HERNIA REPAIR     . WRIST SURGERY Right      Family  Medical History:     None            Social History     Socioeconomic History   . Marital status: Widowed     Spouse name: Not on file   . Number of children: Not on file   . Years of education: Not on file   . Highest education level: Not on file   Tobacco Use   . Smoking status: Never Smoker   . Smokeless tobacco: Never Used   Substance and Sexual Activity   . Alcohol use: Not Currently   Other Topics Concern       Objective   Vitals:  Ht 1.803 m (5\' 11" )   Wt 82.8 kg (182 lb 8 oz)   BMI 25.45 kg/m       Body mass index is 25.45 kg/m.    Review of Systems:   Constitutional: no fever, chills, or unexpected weight loss or weight gain   HEENT: no vision problem, earaches or sore throat  Respiratory: no cough or wheeze  Cardiovascular: no chest pain, palpitations  or edema  Gastrointestinal: no abdominal pain, nausea, vomiting or diarrhea   Neurological: no headaches or weakness  Psych: no anxiety or depression  All other ROS Negative    Physical Exam:  Constitutional: Patient is oriented to person, place, and time and well-developed, well-nourished, and in no distress    Musculoskeletal:  examination of left knee:  Skin intact.  No swelling.  Slight crepitus noted at the patellofemoral joint.  Active range of motion is 0 extension to 120 degree flexion.  Stable to varus and valgus stress.  Neurovascularly intact distally.    Laboratory Studies/Data Reviewed   I have reviewed all available imagining and laboratory studies as appropriate.  No visits with results within 1 Month(s) from this visit.   Latest known visit with results is:   No results found for any previous visit.       Imaging:  Left knee x-ray performed today in the office and reviewed with the patient.  There is mild degenerative changes noted in the medial joint compartment.  No other acute bony injury.    Assessment/Plan   Diagnosis:    ICD-10-CM    1. Left knee pain M25.562 XR KNEE STANDING LEFT     XR KNEE STANDING LEFT (AMB ONLY)     Encounter  Medications and Orders  Orders Placed This Encounter   . XR KNEE STANDING LEFT (AMB ONLY)   . XR KNEE STANDING LEFT       Plan:  Discussed history, physical exam and imaging findings with the patient in the office today.  Also discussed possible treatment options including corticosteroid injection, viscosupplementation injection, physical therapy and the possibility of future total knee arthroplasty.  At this time, patient does not have any pain and would like to hold off on any treatment.  Advised that he should call in the future if he develops pain that prohibits him from playing golf and we can perform viscosupplementation injections.  He will follow-up on an as-needed basis.    No follow-ups on file.  Paizley Ramella Renee Tangia Pinard, PA-C  03/19/2018, 14:48    I am seeing this patient independently with co-signing supervising physician present in clinic.    This note was partially generated using MModal Fluency Direct system, and there may be some incorrect words, spellings, and punctuation that were not noted in checking the note before saving

## 2018-08-22 DIAGNOSIS — R110 Nausea: Secondary | ICD-10-CM | POA: Insufficient documentation

## 2018-08-22 DIAGNOSIS — Z9189 Other specified personal risk factors, not elsewhere classified: Secondary | ICD-10-CM | POA: Insufficient documentation

## 2018-11-15 ENCOUNTER — Other Ambulatory Visit (HOSPITAL_COMMUNITY): Payer: Self-pay | Admitting: Physician Assistant

## 2018-11-15 DIAGNOSIS — R202 Paresthesia of skin: Secondary | ICD-10-CM

## 2018-11-16 ENCOUNTER — Other Ambulatory Visit: Payer: Self-pay

## 2018-11-19 ENCOUNTER — Other Ambulatory Visit (HOSPITAL_COMMUNITY): Payer: Self-pay | Admitting: Physician Assistant

## 2018-11-19 DIAGNOSIS — M545 Low back pain, unspecified: Secondary | ICD-10-CM

## 2018-11-19 DIAGNOSIS — R202 Paresthesia of skin: Secondary | ICD-10-CM

## 2018-11-29 ENCOUNTER — Ambulatory Visit (HOSPITAL_BASED_OUTPATIENT_CLINIC_OR_DEPARTMENT_OTHER): Payer: 59

## 2018-11-29 ENCOUNTER — Other Ambulatory Visit: Payer: Self-pay

## 2018-11-29 ENCOUNTER — Ambulatory Visit (HOSPITAL_BASED_OUTPATIENT_CLINIC_OR_DEPARTMENT_OTHER)
Admission: RE | Admit: 2018-11-29 | Discharge: 2018-11-29 | Disposition: A | Discharge status: Home / Self Care | Payer: 59 | Source: Ambulatory Visit | Attending: Physician Assistant | Admitting: Physician Assistant

## 2018-11-29 ENCOUNTER — Ambulatory Visit
Admission: RE | Admit: 2018-11-29 | Discharge: 2018-11-29 | Disposition: A | Discharge status: Home / Self Care | Payer: 59 | Source: Ambulatory Visit | Attending: Physician Assistant | Admitting: Physician Assistant

## 2018-11-29 DIAGNOSIS — M545 Low back pain, unspecified: Secondary | ICD-10-CM

## 2018-11-29 DIAGNOSIS — R202 Paresthesia of skin: Secondary | ICD-10-CM | POA: Insufficient documentation

## 2019-01-17 DIAGNOSIS — F32A Depression, unspecified: Secondary | ICD-10-CM | POA: Insufficient documentation

## 2019-01-17 DIAGNOSIS — K5792 Diverticulitis of intestine, part unspecified, without perforation or abscess without bleeding: Secondary | ICD-10-CM | POA: Insufficient documentation

## 2019-01-17 DIAGNOSIS — B20 Human immunodeficiency virus [HIV] disease: Secondary | ICD-10-CM | POA: Insufficient documentation

## 2019-01-17 DIAGNOSIS — Z21 Asymptomatic human immunodeficiency virus [HIV] infection status: Secondary | ICD-10-CM | POA: Insufficient documentation

## 2019-01-17 DIAGNOSIS — B029 Zoster without complications: Secondary | ICD-10-CM | POA: Insufficient documentation

## 2019-01-17 DIAGNOSIS — J80 Acute respiratory distress syndrome: Secondary | ICD-10-CM | POA: Insufficient documentation

## 2019-02-15 DIAGNOSIS — M461 Sacroiliitis, not elsewhere classified: Secondary | ICD-10-CM | POA: Insufficient documentation

## 2019-11-29 DIAGNOSIS — M25562 Pain in left knee: Secondary | ICD-10-CM | POA: Insufficient documentation

## 2020-02-06 DIAGNOSIS — M542 Cervicalgia: Secondary | ICD-10-CM | POA: Insufficient documentation

## 2020-02-06 DIAGNOSIS — M545 Low back pain, unspecified: Secondary | ICD-10-CM | POA: Insufficient documentation

## 2020-03-13 DIAGNOSIS — F411 Generalized anxiety disorder: Secondary | ICD-10-CM | POA: Insufficient documentation

## 2020-03-24 ENCOUNTER — Observation Stay
Admission: EM | Admit: 2020-03-24 | Discharge: 2020-03-25 | Disposition: A | Discharge status: Home / Self Care | Payer: 59 | Source: Ambulatory Visit | Attending: Internal Medicine | Admitting: Internal Medicine

## 2020-03-24 ENCOUNTER — Emergency Department (HOSPITAL_COMMUNITY): Payer: 59

## 2020-03-24 ENCOUNTER — Emergency Department (HOSPITAL_COMMUNITY): Payer: 59 | Admitting: Radiology

## 2020-03-24 ENCOUNTER — Observation Stay (HOSPITAL_COMMUNITY): Payer: Non-veteran care | Admitting: HOSPITALIST-INTERNAL MEDICINE

## 2020-03-24 ENCOUNTER — Other Ambulatory Visit: Payer: Self-pay

## 2020-03-24 ENCOUNTER — Encounter (HOSPITAL_COMMUNITY): Payer: Self-pay

## 2020-03-24 DIAGNOSIS — I129 Hypertensive chronic kidney disease with stage 1 through stage 4 chronic kidney disease, or unspecified chronic kidney disease: Secondary | ICD-10-CM | POA: Insufficient documentation

## 2020-03-24 DIAGNOSIS — R42 Dizziness and giddiness: Secondary | ICD-10-CM

## 2020-03-24 DIAGNOSIS — R0789 Other chest pain: Secondary | ICD-10-CM

## 2020-03-24 DIAGNOSIS — I951 Orthostatic hypotension: Secondary | ICD-10-CM | POA: Insufficient documentation

## 2020-03-24 DIAGNOSIS — Z79899 Other long term (current) drug therapy: Secondary | ICD-10-CM | POA: Insufficient documentation

## 2020-03-24 DIAGNOSIS — R55 Syncope and collapse: Secondary | ICD-10-CM

## 2020-03-24 DIAGNOSIS — N189 Chronic kidney disease, unspecified: Secondary | ICD-10-CM | POA: Insufficient documentation

## 2020-03-24 DIAGNOSIS — K869 Disease of pancreas, unspecified: Secondary | ICD-10-CM

## 2020-03-24 DIAGNOSIS — I10 Essential (primary) hypertension: Secondary | ICD-10-CM

## 2020-03-24 DIAGNOSIS — B20 Human immunodeficiency virus [HIV] disease: Secondary | ICD-10-CM

## 2020-03-24 DIAGNOSIS — Z20822 Contact with and (suspected) exposure to covid-19: Secondary | ICD-10-CM | POA: Insufficient documentation

## 2020-03-24 DIAGNOSIS — E782 Mixed hyperlipidemia: Secondary | ICD-10-CM

## 2020-03-24 DIAGNOSIS — E785 Hyperlipidemia, unspecified: Secondary | ICD-10-CM

## 2020-03-24 DIAGNOSIS — Z79890 Hormone replacement therapy (postmenopausal): Secondary | ICD-10-CM | POA: Insufficient documentation

## 2020-03-24 DIAGNOSIS — N179 Acute kidney failure, unspecified: Secondary | ICD-10-CM

## 2020-03-24 DIAGNOSIS — E039 Hypothyroidism, unspecified: Secondary | ICD-10-CM

## 2020-03-24 DIAGNOSIS — K8689 Other specified diseases of pancreas: Secondary | ICD-10-CM

## 2020-03-24 DIAGNOSIS — K219 Gastro-esophageal reflux disease without esophagitis: Secondary | ICD-10-CM

## 2020-03-24 DIAGNOSIS — Z21 Asymptomatic human immunodeficiency virus [HIV] infection status: Secondary | ICD-10-CM | POA: Insufficient documentation

## 2020-03-24 DIAGNOSIS — R079 Chest pain, unspecified: Secondary | ICD-10-CM | POA: Diagnosis present

## 2020-03-24 LAB — TROPONIN-I
TROPONIN I: <7 ng/L — ABNORMAL LOW (ref 7–30)
TROPONIN I: <7 ng/L — ABNORMAL LOW (ref 7–30)

## 2020-03-24 LAB — ECG 12 LEAD
Atrial Rate: 80 {beats}/min
Atrial Rate: 62 {beats}/min
Atrial Rate: 62 {beats}/min
Calculated P Axis: 70 degrees
Calculated P Axis: 33 degrees
Calculated R Axis: 50 degrees
Calculated R Axis: 45 degrees
Calculated R Axis: 34 degrees
Calculated T Axis: 60 degrees
Calculated T Axis: 61 degrees
Calculated T Axis: 50 degrees
PR Interval: 144 ms
PR Interval: 146 ms
PR Interval: 128 ms
QRS Duration: 82 ms
QRS Duration: 78 ms
QRS Duration: 78 ms
QT Interval: 390 ms
QT Interval: 440 ms
QT Interval: 450 ms
QTC Calculation: 449 ms
QTC Calculation: 446 ms
QTC Calculation: 456 ms
Ventricular rate: 80 {beats}/min
Ventricular rate: 62 {beats}/min
Ventricular rate: 62 {beats}/min

## 2020-03-24 LAB — CBC
HCT: 45.8 % (ref 38.9–52.0)
HGB: 15.7 g/dL (ref 13.4–17.5)
MCH: 31.5 pg (ref 26.0–32.0)
MCHC: 34.3 g/dL (ref 31.0–35.5)
MCV: 91.8 fL (ref 78.0–100.0)
MPV: 10.6 fL (ref 8.7–12.5)
PLATELETS: 210 10*3/uL (ref 150–400)
RBC: 4.99 10*6/uL (ref 4.50–6.10)
RDW-CV: 13.2 % (ref 11.5–15.5)
WBC: 14.5 10*3/uL — ABNORMAL HIGH (ref 3.7–11.0)

## 2020-03-24 LAB — COMPREHENSIVE METABOLIC PANEL, NON-FASTING
ALBUMIN: 3.5 g/dL (ref 3.4–4.8)
ALKALINE PHOSPHATASE: 88 U/L (ref 45–115)
ALT (SGPT): 32 U/L (ref 10–55)
ANION GAP: 8 mmol/L (ref 4–13)
AST (SGOT): 22 U/L (ref 8–45)
BILIRUBIN TOTAL: 1 mg/dL (ref 0.3–1.3)
BUN/CREA RATIO: 27 — ABNORMAL HIGH (ref 6–22)
BUN: 43 mg/dL — ABNORMAL HIGH (ref 8–25)
CALCIUM: 8.9 mg/dL (ref 8.8–10.2)
CHLORIDE: 103 mmol/L (ref 96–111)
CO2 TOTAL: 22 mmol/L — ABNORMAL LOW (ref 23–31)
CREATININE: 1.62 mg/dL — ABNORMAL HIGH (ref 0.75–1.35)
ESTIMATED GFR: 41 mL/min/BSA — ABNORMAL LOW (ref >=60)
GLUCOSE: 134 mg/dL — ABNORMAL HIGH (ref 65–125)
POTASSIUM: 4.8 mmol/L (ref 3.5–5.1)
PROTEIN TOTAL: 6.4 g/dL (ref 6.0–8.0)
SODIUM: 133 mmol/L — ABNORMAL LOW (ref 136–145)

## 2020-03-24 LAB — URINALYSIS, MACROSCOPIC
BILIRUBIN: NOT DETECTED mg/dL
BLOOD: NOT DETECTED mg/dL
GLUCOSE: NOT DETECTED mg/dL
KETONES: NOT DETECTED mg/dL
LEUKOCYTES: NOT DETECTED WBCs/uL
NITRITE: NOT DETECTED
PH: 5 — ABNORMAL LOW (ref 5.0–8.0)
PROTEIN: NOT DETECTED mg/dL
SPECIFIC GRAVITY: 1.02 (ref 1.005–1.030)
UROBILINOGEN: NOT DETECTED mg/dL

## 2020-03-24 LAB — COVID-19 ~~LOC~~ MOLECULAR LAB TESTING
INFLUENZA VIRUS TYPE A: NOT DETECTED
INFLUENZA VIRUS TYPE B: NOT DETECTED
RESPIRATORY SYNCTIAL VIRUS (RSV): NOT DETECTED
SARS-CoV-2: NOT DETECTED

## 2020-03-24 LAB — MAGNESIUM: MAGNESIUM: 2.5 mg/dL (ref 1.8–2.6)

## 2020-03-24 LAB — B-TYPE NATRIURETIC PEPTIDE: BNP: 17 pg/mL (ref <=99)

## 2020-03-24 LAB — PT/INR
INR: 1.03 (ref <=5.00)
PROTHROMBIN TIME: 12.1 seconds (ref 9.7–13.6)

## 2020-03-24 LAB — LIPASE: LIPASE: 85 U/L — ABNORMAL HIGH (ref 10–60)

## 2020-03-24 LAB — PTT (PARTIAL THROMBOPLASTIN TIME): APTT: 23.3 seconds — ABNORMAL LOW (ref 26.0–39.0)

## 2020-03-24 LAB — LDL CHOLESTEROL, DIRECT: LDL DIRECT: 89 mg/dL (ref <100)

## 2020-03-24 MED ORDER — SODIUM CHLORIDE 0.9 % (FLUSH) INJECTION SYRINGE
3.0000 mL | INJECTION | Freq: Three times a day (TID) | INTRAMUSCULAR | Status: DC
Start: 2020-03-24 — End: 2020-03-25
  Administered 2020-03-24 – 2020-03-25 (×2): 3 mL
  Administered 2020-03-25: 0 mL

## 2020-03-24 MED ORDER — DOCUSATE SODIUM 100 MG CAPSULE
100.0000 mg | ORAL_CAPSULE | Freq: Two times a day (BID) | ORAL | Status: DC
Start: 2020-03-24 — End: 2020-03-25
  Administered 2020-03-24: 21:00:00 100 mg via ORAL
  Administered 2020-03-25: 09:00:00 0 mg via ORAL
  Filled 2020-03-24 (×2): qty 1

## 2020-03-24 MED ORDER — ONDANSETRON HCL (PF) 4 MG/2 ML INJECTION SOLUTION
4.0000 mg | Freq: Four times a day (QID) | INTRAMUSCULAR | Status: DC | PRN
Start: 2020-03-24 — End: 2020-03-25

## 2020-03-24 MED ORDER — GABAPENTIN 300 MG CAPSULE
300.0000 mg | ORAL_CAPSULE | Freq: Three times a day (TID) | ORAL | Status: DC
Start: 2020-03-24 — End: 2020-03-25
  Administered 2020-03-24 – 2020-03-25 (×3): 300 mg via ORAL
  Filled 2020-03-24 (×3): qty 1

## 2020-03-24 MED ORDER — SODIUM CHLORIDE 0.9 % IV BOLUS
1000.00 mL | INJECTION | Status: AC
Start: 2020-03-24 — End: 2020-03-24
  Administered 2020-03-24: 17:00:00 0 mL via INTRAVENOUS
  Administered 2020-03-24: 1000 mL via INTRAVENOUS

## 2020-03-24 MED ORDER — CHOLECALCIFEROL (VITAMIN D3) 25 MCG (1,000 UNIT) TABLET
1000.0000 [IU] | ORAL_TABLET | Freq: Every day | ORAL | Status: DC
Start: 2020-03-25 — End: 2020-03-25
  Administered 2020-03-25: 1000 [IU] via ORAL
  Filled 2020-03-24: qty 1

## 2020-03-24 MED ORDER — SODIUM CHLORIDE 0.9 % (FLUSH) INJECTION SYRINGE
3.00 mL | INJECTION | Freq: Three times a day (TID) | INTRAMUSCULAR | Status: DC
Start: 2020-03-24 — End: 2020-03-24
  Administered 2020-03-24: 14:00:00 0 mL

## 2020-03-24 MED ORDER — SODIUM CHLORIDE 0.9 % (FLUSH) INJECTION SYRINGE
3.00 mL | INJECTION | INTRAMUSCULAR | Status: DC | PRN
Start: 2020-03-24 — End: 2020-03-24

## 2020-03-24 MED ORDER — SODIUM CHLORIDE 0.9 % (FLUSH) INJECTION SYRINGE
3.0000 mL | INJECTION | INTRAMUSCULAR | Status: DC | PRN
Start: 2020-03-24 — End: 2020-03-25

## 2020-03-24 MED ORDER — IOPAMIDOL 370 MG IODINE/ML (76 %) INTRAVENOUS SOLUTION
56.0000 mL | INTRAVENOUS | Status: AC
Start: 2020-03-24 — End: 2020-03-24
  Administered 2020-03-24: 16:00:00 56 mL via INTRAVENOUS

## 2020-03-24 MED ORDER — LEVOTHYROXINE 88 MCG TABLET
88.0000 ug | ORAL_TABLET | Freq: Every morning | ORAL | Status: DC
Start: 2020-03-25 — End: 2020-03-25
  Administered 2020-03-25: 88 ug via ORAL
  Filled 2020-03-24: qty 1

## 2020-03-24 MED ORDER — ATORVASTATIN 40 MG TABLET
40.0000 mg | ORAL_TABLET | Freq: Every evening | ORAL | Status: DC
Start: 2020-03-24 — End: 2020-03-25
  Administered 2020-03-24: 40 mg via ORAL

## 2020-03-24 MED ORDER — LORATADINE 10 MG TABLET
10.0000 mg | ORAL_TABLET | Freq: Every day | ORAL | Status: DC
Start: 2020-03-25 — End: 2020-03-25
  Administered 2020-03-25 (×2): 10 mg via ORAL
  Filled 2020-03-24: qty 1

## 2020-03-24 MED ORDER — PANTOPRAZOLE 40 MG TABLET,DELAYED RELEASE
40.0000 mg | DELAYED_RELEASE_TABLET | Freq: Every day | ORAL | Status: DC
Start: 2020-03-24 — End: 2020-03-25
  Administered 2020-03-25: 40 mg via ORAL
  Filled 2020-03-24: qty 1

## 2020-03-24 MED ORDER — ELVITEG 150 MG-COB 150 MG-EMTRICIT 200 MG-TENOFO ALAFENAM 10 MG TABLET
1.0000 | ORAL_TABLET | Freq: Every evening | ORAL | Status: DC
Start: 2020-03-25 — End: 2020-03-25
  Administered 2020-03-25: 18:00:00 1 via ORAL

## 2020-03-24 MED ORDER — AMLODIPINE 5 MG TABLET
5.0000 mg | ORAL_TABLET | Freq: Every day | ORAL | Status: DC
Start: 2020-03-25 — End: 2020-03-25
  Administered 2020-03-25: 09:00:00 0 mg via ORAL

## 2020-03-24 MED ORDER — SODIUM CHLORIDE 0.9 % INJECTION SOLUTION
2.0000 mL | INTRAMUSCULAR | Status: DC
Start: 2020-03-24 — End: 2020-03-25

## 2020-03-24 MED ORDER — ENOXAPARIN 40 MG/0.4 ML SUBCUTANEOUS SYRINGE
40.0000 mg | INJECTION | SUBCUTANEOUS | Status: DC
Start: 2020-03-24 — End: 2020-03-25
  Administered 2020-03-24: 40 mg via SUBCUTANEOUS
  Filled 2020-03-24: qty 0.4

## 2020-03-24 MED ORDER — ASPIRIN 81 MG CHEWABLE TABLET
81.0000 mg | CHEWABLE_TABLET | Freq: Every day | ORAL | Status: DC
Start: 2020-03-25 — End: 2020-03-25
  Administered 2020-03-25: 81 mg via ORAL
  Filled 2020-03-24: qty 1

## 2020-03-24 MED ORDER — MELATONIN 3 MG TABLET
10.0000 mg | ORAL_TABLET | Freq: Every evening | ORAL | Status: DC
Start: 2020-03-24 — End: 2020-03-25
  Administered 2020-03-24: 21:00:00 10.5 mg via ORAL
  Filled 2020-03-24: qty 4

## 2020-03-24 MED ORDER — ACETAMINOPHEN 500 MG TABLET
500.0000 mg | ORAL_TABLET | ORAL | Status: DC | PRN
Start: 2020-03-24 — End: 2020-03-25
  Administered 2020-03-24: 21:00:00 500 mg via ORAL
  Filled 2020-03-24: qty 1

## 2020-03-24 MED ORDER — MAGNESIUM HYDROXIDE 400 MG/5 ML ORAL SUSPENSION
15.0000 mL | Freq: Every day | ORAL | Status: DC | PRN
Start: 2020-03-24 — End: 2020-03-25

## 2020-03-24 MED ORDER — METOPROLOL TARTRATE 25 MG TABLET
12.5000 mg | ORAL_TABLET | Freq: Two times a day (BID) | ORAL | Status: DC
Start: 2020-03-24 — End: 2020-03-24
  Administered 2020-03-24: 21:00:00 12.5 mg via ORAL
  Filled 2020-03-24: qty 1

## 2020-03-24 NOTE — H&P (Signed)
History and Physical Exam     [name] 75 y.o. male  Date of Birth: 09-19-1945  Date of Admit:  03/24/2020   Date of Service: 03/24/2020    Attending: Judyann Munson, DO Code Status:No Order   PCP: [facility]      Chief Complaint:  Chest Pain        History of Presenting Illness: [name] is a 75 y.o., White male with past medical history significant for GERD, hyperlipidemia, hypertension, hypothyroidism, HIV who presents the hospital with chest pain.  Patient states that early Saturday, 03/21/2020, he woke up with sharp chest pain on the left side of his chest with radiation across his back.  Associated diaphoresis, denies shortness of breath, nausea, vomiting.  States that he was able to walk around, belch, and able to fall back asleep. States he attributed this to gas pains, and continued with his day.  Patient states that he then began to have recurrent similar sharp chest pain/pressure this morning, rated 8/10, that radiated to his back and with associated diaphoresis.  Denies shortness of breath, nausea, vomiting.  States he tried to get out of bed, walk around, and belch but with little relief.  He finally found comfort sitting in his recliner, fell back asleep, told his wife about this pain when she woke up and they decided to come to the emergency department. Patient denies personal cardiac history, but endorses familial history of MI and heart disease.  Patient states about 1 year ago, patient had an episode of dizziness and near-syncope while golfing, and states this has happened intermittently since.  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    Of note, patient states that he had previously been noted to have a cystic  mass on his pancreas, had been referred to a Psychologist, sport and exercise in [place].  Patient states that the surgeon in [place] did a scope and stated that the patient just had multiple "water-filled cysts," and that they would continue to monitor.    ED Course & Medications:     Lab work in the ED significant for sodium 133, hemoglobin 15.7, white count 14.5, BUN 43, creatinine 1.62, glucose 134, magnesium 2 5, troponins negative, lipase 85, BNP 17.     Chest x-ray negative for acute process.  CT of the head negative for acute process.  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  Urinalysis negative.    EKG showed normal sinus rhythm, rate 62, QTC 456.     Medications Administered in the ED   NS flush syringe (has no administration in time range)   NS flush syringe (has no administration in time range)   NS bolus infusion 1,000 mL (0 mL Intravenous Stopped 03/24/20 1651)   iopamidol (ISOVUE-370) 76% infusion (56 mL Intravenous Given 03/24/20 1532)       Review of Systems:   A 10 point organ system review was conducted with the patient and all was negative except for what was mentioned in the HPI.      OBJECTIVE  Medical History     PMHx:    Past Medical History:   Diagnosis Date    Esophageal reflux     High cholesterol  HTN (hypertension)     Hypothyroidism       Allergies:    No Known Allergies  Social History  Social History     Tobacco Use    Smoking status: Never Smoker    Smokeless tobacco: Never Used   Substance Use Topics    Alcohol use: Not Currently     Family History  Family Medical History:    None        Home Meds:   amlodipine besylate, atorvastatin, cetirizine, cholecalciferol (vitamin D3), docusate sodium, elviteg-cob-emtri-tenof ALAFEN, gabapentin, levothyroxine, melatonin, omega-3-DHA-EPA-fish oil, and omeprazole         Objective Findings     PHYSICAL EXAM:  VITALS: BP 131/76    Pulse 65    Temp 36.7 C (98.1 F)    Resp 16    Ht 1.778 m (_0 )    Wt 82.6 kg (182 lb)     SpO2 92%    BMI 26.11 kg/m     GENERAL:   Pt is a pleasant, well-nourished, well-developed 75 y.o. male who is resting comfortably in bed in no acute distress. Appears stated age.  HEENT:  Head normocephalic. Sclera non-icteric, non-injected. Oropharyngeal mucous membranes are moist w/o erythema/exudates.  CV: RRR. No murmurs. No BLE edema. 2+ pulses present bilaterally.   LUNGS: Clear to auscultation bilaterally. No rhonchi, rales, wheezes.   GI: (+) BS in all 4 quadrants. Soft, NT/ ND. No rigidity/ guarding/ rebound.  MSK: full spontaneous ROM of all 4 extremities. Gait not assessed at this time.  SKIN: No significant lesions, rashes, ecchymoses noted.   NEURO: AAOx4, CN grossly intact. Sensation intact b/l. No focal deficits.  PSYCH: Mood, behavior and affect normal w/intact judgement & insight        Summary of Lab Work and Diagnostic Studies:     CBC   Recent Labs     03/24/20  1051   WBC 14.5*   HGB 15.7   HCT 45.8   PLTCNT 210      Chemistries   Recent Labs     03/24/20  1051 03/24/20  1322   SODIUM 133*  --    POTASSIUM 4.8  --    CHLORIDE 103  --    CO2 22*  --    BUN 43*  --    CREATININE 1.62*  --    CALCIUM 8.9  --    ALBUMIN 3.5  --    MAGNESIUM  --  2.5      Liver Enzymes   Recent Labs     03/24/20  1051 03/24/20  1651   TOTALPROTEIN 6.4  --    ALBUMIN 3.5  --    AST 22  --    ALT 32  --    ALKPHOS 88  --    LIPASE  --  85*      Inflammatory Markers   No results for input(s): CRP, ESR, LACTICACID in the last 72 hours.    Cardiac and Coags   Recent Labs     03/24/20  1051 03/24/20  1322   BNP  --  17   INR 1.03  --       Lipid Panel   No results for input(s): HDL in the last 72 hours.    Invalid input(s): LDL, CHOL     Results for orders placed or performed during the hospital encounter of 03/24/20 (from the past 24 hour(s))   XR AP MOBILE CHEST  Status: None    Narrative    Portable chest x-ray:    CLINICAL HISTORY: Severe left chest pain with shortness of breath.    A single frontal portable  view of the chest was obtained. The heart size is normal. There is minimal left basilar atelectasis. Otherwise the lungs are clear. There is no pneumothorax or pleural effusion.      Impression    1. Minimal left basilar atelectasis but no acute cardiopulmonary disease.      Radiologist location ID: [id number]     CT BRAIN WO IV CONTRAST     Status: None    Narrative    Exam:CT of the head without contrast:    Clinical history:Dizziness and chest pain    Total DLP1318my*cm    This CT scanner is equipped with dose reducing technology which automatically reduces the MAS according to patient size to achieve the lowest radiation exposure possible.      Axial noncontrast 5 mm images were obtained through the brain and viewed with brain and bone windows. In addition, sagittal and coronal reconstructions were completed.    There is no evidence of intracranial hemorrhage, midline shift, hydrocephalus, or acute ischemia in a major vascular territory. There is prominence of the subarachnoid spaces and ventricular system consistent with diffuse parenchymal volume loss compatible with the patient's age. Bone windows demonstrate no definite calvarial fracture.      Impression    1. Age-appropriate central and cortical atrophy.  2. No acute intracranial process.  3. There is no calvarial fracture.  4. Moderate dural calcification is noted along the falx debris.  5. There is a large mucous retention cyst inferiorly in the right maxillary sinus.        Radiologist location ID: [id number]    CT ANGIO CHEST W IV CONTRAST     Status: None    Narrative    Male, 75years old.    CT ANGIO CHEST W IV CONTRAST performed on 03/24/2020 3:29 PM.    REASON FOR EXAM:  CP radiating to back  RADIATION DOSE: 441 DLP  CONTRAST: 56 ml's of Isovue 370    TECHNIQUE: Enhanced 3 mm transaxial and sagittal coronal reconstruction imaging performed through the chest. 3-D angiography was performed.   The CT scanner is equipped with dose reducing technology.  The MAS is automatically adjusted to the patient body size in order to deliver the lowest dose possible.    COMPARISON: None    FINDINGS: There is contrast enhancement in the main pulmonary and right and left pulmonary arteries and no filling defects are identified. No mediastinal or hilar adenopathy is seen.    The lung window images show the lungs to be expanded. Subpleural interstitial changes are noted in both lower lobes likely associated with dependent atelectasis. No pleural effusion seen.    Degenerative changes are noted in the thoracic spine. Very mild superior endplate couple compression deformity is noted in one midthoracic vertebral bodies likely old.    The thoracic aorta appears normal. No findings to indicate dissection is seen on the CT angiographic chest.    Images of the upper abdomen show the visualized portion of the liver and spleen appear unremarkable. There is a hypoattenuating structure in the left lobe of the liver.    Adrenal glands appear unremarkable. In the body of the pancreas there is a 6.8 cm x 4.3 cm complex cystic mass. This is not fully evaluated. CT scan of the abdomen and pelvis as  a follow-up is recommended. There is evidence for anterior abdominal wall hernia repair with mesh incompletely evaluated.      Impression    1. No CT evidence of pulmonary emboli detected.  2. No acute intrathoracic process is seen.  3. 6.8 x 4.3 cm complex cystic mass is noted in the body of the pancreas. Question pancreatic neoplasm. The findings were called to the emergency Department.      Radiologist location ID: [id number]              Assessment & Plan       Assessments/Plan:    Chest pain:  Patient states he originally had an episode of chest pain on Saturday, 03/21/2020, states he woke up with sharp chest pain to the left side of his chest that radiated to his back, associates diaphoresis, denies shortness of breath, nausea, vomiting.  Patient fell relief after belching, was able to go back to  sleep.  Patient had a second episode of chest pain this morning, states it was sharp/pressure, rated 8/10, with radiation to his back and associated diaphoresis.  Denies shortness of breath, nausea, vomiting.  Initial troponins negative, EKG without ischemic changes.  Currently denies chest pain.  Will consult Cardiology, will order TTE, aspirin, statin, check lipid panel, continue to trend EKGs and troponins, will order stress test.  Initially was going to add metoprolol, but will hold secondary to dizziness and resting heart rate 63.     Near syncope:  Patient endorses near syncopal episodes starting approximately 1 year ago and happening intermittently since.  Patient had positive orthostatic blood pressures in the ED with dizziness on standing.  Likely secondary to dehydration, received 1 L normal saline bolus in ED.  will continue to monitor.    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  Patient states that he had previously been told he had a mass on his pancreas and saw a surgeon in [place], however was told they were "water-filled cysts" and would continue to monitor.  Will try to obtain records from [facility], will determine management based on previous workup and results.    Acute kidney injury:  Creatinine in the ED 1.62, reported baseline 1.4.  Patient states he has existing kidney damage that he was told was from his HIV medication.  Received 1 L bolus in the ED, will recheck  BMP.    GERD:  Continue Prilosec.    Hyperlipidemia:  Continue statin.    Hypertension:  Will hold amlodipine secondary to orthostatic hypotension.    Hypothyroidism:  Continue Synthroid:    HIV:  Continue Genvoya.    Nutrition: DIET NPO - NOW   DVT PPx:  Lovenox 40 mg subcutaneous daily  Code Status: No Order      [name] Maduro, NP  Patient seen and evaluated independently of Dr. Neysa Bonito and she agrees with the above assessment and plan.     I have seen  and evaluated the patient independently from the APP.  I am in agreement with the above assessment, plan, history and physical exam.  I have made any necessary changes.  [name] Esch Dearth, DO      This note may have been partially generated using MModal Fluency Direct system, and there may be some incorrect words, spellings, and punctuation that were not noted in checking the note before saving.     End of Note

## 2020-03-24 NOTE — ED Nurses Note (Signed)
Ortho   Laying 136/78   Hr 74  Sitting 141/74   Hr 68  Standing 116/73  Hr 81  +dizziness

## 2020-03-24 NOTE — ED Nurses Note (Signed)
Report called to [facility] all questions answered pt prepared for transport

## 2020-03-24 NOTE — Care Management Notes (Signed)
CM notified by Jarrett Soho, [place], in the ED, of this patient's admission.  Spoke with [facility] at the [place].  They do not have any beds available at this time for transfer.  T[facility] [place] will notify [facility] of this patient's admission.    Lennart Pall, CASE MANAGER  03/24/2020, 14:21

## 2020-03-24 NOTE — ED Provider Notes (Signed)
Emergency Department  Provider Note  HPI - 03/24/2020    Name: [name]  Age and Gender: 75 y.o. male  Attending: Dr. Maida Sale  APP: Vic Blackbird PA-C      HPI:  [name] is a 75 y.o. male with PMH GERD, HTN, HLD, CKD  who presents to the ED today for Chest pain.   Patient reports that he was woken from sleep late Friday night with chest pain.  Pain was localized to his left side of his chest and radiated around to his back.  Reports it was a severe pain initially, gradually improving on its own.  It completely resolved until he was again a woken from sleep this morning around 5:00 a.m..  He reports that he got out of bed, repositioned in a chair, took 3 Tylenol and symptoms resolved within 1 hour.  He is chest pain-free presently.   Reports associated diaphoresis and dizziness with these episodes.  No shortness of breath, nausea or vomiting.  No abdominal pain.  Patient reports that yesterday while playing golf he became very dizzy and almost passed out.  Denies syncope.  Reports that he plays golf several times per week and does not experience this every time but it has been occurring intermittently for few months now.  Denies any chest pain with these episodes. Pt is a nonsmoker. No hx CAD.     Pt reports he has been sleeping more upright for the last couple weeks and increased his GERD medication as he was having trouble with reflux at night. Reports his indigestion has improved since making these modifications. Reports these episodes of chest pain feel different than his typical indigestion.     History provided by:  Patient    Review of Systems:    Constitutional: diaphoresis. No fever, chills  Skin: No rashes or lesions  HENT: No sore throat, ear pain, or difficulty swallowing  Eyes: No vision changes, redness, discharge  Cardio:  Chest pain radiating to back. No palpitations   Respiratory: No cough, wheezing or SOB  GI:  No nausea or vomiting. No diarrhea or constipation. No  abdominal pain  GU:  No dysuria, hematuria, polyuria  MSK: No joint pain.  No neck pain  Neuro:  Near syncope. No numbness, tingling, or weakness.  No headache  All other systems reviewed and are negative, unless commented on in the HPI.       Below information reviewed with patient:   No current outpatient medications on file.       No Known Allergies       Past Medical History:  Past Medical History:   Diagnosis Date    Esophageal reflux     High cholesterol     HTN (hypertension)     Hypothyroidism        Past Surgical History:  Past Surgical History:   Procedure Laterality Date    Colon surgery      Hx hernia repair      Wrist surgery Right        Social History:  Social History     Tobacco Use    Smoking status: Never Smoker    Smokeless tobacco: Never Used   Substance Use Topics    Alcohol use: Not Currently     Social History     Substance and Sexual Activity   Drug Use Not on file       Family History:  No family history on file.  Old records reviewed.      Objective:  Nursing notes reviewed    Filed Vitals:    03/24/20 1330 03/24/20 1545 03/24/20 1615 03/24/20 1645   BP: 129/69 132/70 136/67 131/76   Pulse: 65 68 65 65   Resp: (!) 21 (!) 21 18 16    Temp:       SpO2: 94% 96% 95% 92%       Physical Exam  Nursing note and vitals reviewed.  Vital signs reviewed as above.     Constitutional: Pt is well-developed and well-nourished. No acute distress. No diaphoresis   Head: Normocephalic and atraumatic.   Eyes: Conjunctivae are normal. Pupils are equal, round. EOM are intact  Neck: Soft, supple, full range of motion.  Cardiovascular: RRR.  Distal pulses present and equal bilaterally.  Pulmonary/Chest: Normal BS BL with no distress. No audible wheezes or crackles are noted.  Abdominal: Soft, nontender, nondistended.  No rebound, guarding, or masses.  Musculoskeletal: Normal range of motion. No deformities.  Exhibits no edema and no tenderness.   Neurological: CNs 2-12 grossly intact.  No focal  deficits noted.  Skin: Warm and dry. No rash or lesions  Psychiatric: Patient has a normal mood and affect.     Work-up:  Orders Placed This Encounter    XR AP MOBILE CHEST    CT BRAIN WO IV CONTRAST    CT ANGIO CHEST W IV CONTRAST    CBC    COMPREHENSIVE METABOLIC PANEL, NON-FASTING    LDL CHOLESTEROL, DIRECT    PTT (PARTIAL THROMBOPLASTIN TIME)    PT/INR    TROPONIN-I (SERIAL TROPONINS)    B-TYPE NATRIURETIC PEPTIDE    MAGNESIUM    URINALYSIS, MACROSCOPIC    COVID-19 - SCREENING - Admission (NON-PUI)    LIPASE    ECG 12 LEAD- TIME WITH TROPONINS    INSERT & MAINTAIN PERIPHERAL IV ACCESS    PERIPHERAL IV DRESSING CHANGE    PATIENT CLASS/LEVEL OF CARE DESIGNATION - [facility]    NS flush syringe    NS flush syringe    NS bolus infusion 1,000 mL    iopamidol (ISOVUE-370) 76% infusion        Labs:  Results for orders placed or performed during the hospital encounter of 03/24/20 (from the past 24 hour(s))   CBC   Result Value Ref Range    WBC 14.5 (H) 3.7 - 11.0 x103/uL    RBC 4.99 4.50 - 6.10 x106/uL    HGB 15.7 13.4 - 17.5 g/dL    HCT 45.8 38.9 - 52.0 %    MCV 91.8 78.0 - 100.0 fL    MCH 31.5 26.0 - 32.0 pg    MCHC 34.3 31.0 - 35.5 g/dL    RDW-CV 13.2 11.5 - 15.5 %    PLATELETS 210 150 - 400 x103/uL    MPV 10.6 8.7 - 12.5 fL   COMPREHENSIVE METABOLIC PANEL, NON-FASTING   Result Value Ref Range    SODIUM 133 (L) 136 - 145 mmol/L    POTASSIUM 4.8 3.5 - 5.1 mmol/L    CHLORIDE 103 96 - 111 mmol/L    CO2 TOTAL 22 (L) 23 - 31 mmol/L    ANION GAP 8 4 - 13 mmol/L    BUN 43 (H) 8 - 25 mg/dL    CREATININE 1.62 (H) 0.75 - 1.35 mg/dL    BUN/CREA RATIO 27 (H) 6 - 22    ESTIMATED GFR 41 (L) >=60 mL/min/BSA    ALBUMIN 3.5 3.4 -  4.8 g/dL     CALCIUM 8.9 8.8 - 10.2 mg/dL    GLUCOSE 134 (H) 65 - 125 mg/dL    ALKALINE PHOSPHATASE 88 45 - 115 U/L    ALT (SGPT) 32 10 - 55 U/L    AST (SGOT)  22 8 - 45 U/L    BILIRUBIN TOTAL 1.0 0.3 - 1.3 mg/dL    PROTEIN TOTAL 6.4 6.0 - 8.0 g/dL   LDL CHOLESTEROL, DIRECT   Result Value  Ref Range    LDL DIRECT 89 <100 mg/dL   PTT (PARTIAL THROMBOPLASTIN TIME)   Result Value Ref Range    APTT 23.3 (L) 26.0 - 39.0 seconds   PT/INR   Result Value Ref Range    PROTHROMBIN TIME 12.1 9.7 - 13.6 seconds    INR 1.03 <=5.00   TROPONIN-I (SERIAL TROPONINS)   Result Value Ref Range    TROPONIN I <7 (L) 7 - 30 ng/L   TROPONIN-I (SERIAL TROPONINS)   Result Value Ref Range    TROPONIN I <7 (L) 7 - 30 ng/L   B-TYPE NATRIURETIC PEPTIDE   Result Value Ref Range    BNP 17 <=99 pg/mL   MAGNESIUM   Result Value Ref Range    MAGNESIUM 2.5 1.8 - 2.6 mg/dL   URINALYSIS, MACROSCOPIC   Result Value Ref Range    COLOR Yellow Colorless, Straw, Yellow    APPEARANCE Clear Clear    SPECIFIC GRAVITY 1.020 1.005 - 1.030    PH 5.0 (L) 5.0 - 8.0    PROTEIN Not Detected Not Detected mg/dL    GLUCOSE Not Detected Not Detected mg/dL    KETONES Not Detected Not Detected mg/dL    UROBILINOGEN Not Detected Not Detected mg/dL    BILIRUBIN Not Detected Not Detected mg/dL    BLOOD Not Detected Not Detected mg/dL    NITRITE Not Detected Not Detected    LEUKOCYTES Not Detected Not Detected WBCs/uL   COVID-19 - SCREENING - Admission (NON-PUI)   Result Value Ref Range    SARS-CoV-2 Not Detected Not Detected    INFLUENZA VIRUS TYPE A Not Detected Not Detected    INFLUENZA VIRUS TYPE B Not Detected Not Detected    RESPIRATORY SYNCTIAL VIRUS (RSV) Not Detected Not Detected    Narrative    Results are for the simultaneous qualitative identification of SARS-CoV-2 (formerly 2019-nCoV), Influenza A, Influenza B, and RSV RNA. These etiologic agents are generally detectable in nasopharyngeal and nasal swabs during the ACUTE PHASE of infection. Hence, this test is intended to be performed on respiratory specimens collected from individuals with signs and symptoms of upper respiratory tract infection who meet Centers for Disease Control and Prevention (CDC) clinical and/or epidemiological criteria for Coronavirus Disease 2019 (COVID-19) testing. CDC  COVID-19 criteria for testing on human specimens is available at [facility] webpage information for Healthcare Professionals: Coronavirus Disease 2019 (COVID-19) ([email or web address]).     False-negative results may occur if the virus has genomic mutations, insertions, deletions, or rearrangements or if performed very early in the course of illness. Otherwise, negative results indicate virus specific RNA targets are not detected, however negative results do not preclude SARS-CoV-2 infection/COVID-19, Influenza, or Respiratory syncytial virus infection. Results should not be used as the sole basis for patient management decisions. Negative results must be combined with clinical observations, patient history, and epidemiological information. If upper respiratory tract infection is still suspected based on exposure history together with other clinical findings, re-testing should be considered.    Disclaimer:  This assay has been authorized by FDA under an Emergency Use Authorization for use in laboratories certified under the Clinical Laboratory Improvement Amendments of 1988 (CLIA), 42 U.S.C. [phone], to perform high complexity tests. The impacts of vaccines, antiviral therapeutics, antibiotics, chemotherapeutic or immunosuppressant drugs have not been evaluated.     Test methodology:   Cepheid Xpert Xpress SARS-CoV-2/Flu/RSV Assay real-time polymerase chain reaction (RT-PCR) test on the GeneXpert Dx and Xpert Xpress systems.   LIPASE   Result Value Ref Range    LIPASE 85 (H) 10 - 60 U/L       Imaging:     Results for orders placed or performed during the hospital encounter of 03/24/20 (from the past 72 hour(s))   XR AP MOBILE CHEST     Status: None    Narrative    Portable chest x-ray:    CLINICAL HISTORY: Severe left chest pain with shortness of breath.    A single frontal portable view of the chest was obtained. The heart size is normal. There is minimal left basilar atelectasis.  Otherwise the lungs are clear. There is no pneumothorax or pleural effusion.      Impression    1. Minimal left basilar atelectasis but no acute cardiopulmonary disease.      Radiologist location ID: [id number]     CT BRAIN WO IV CONTRAST     Status: None    Narrative    Exam:CT of the head without contrast:    Clinical history:Dizziness and chest pain    Total [id number] y*cm    This CT scanner is equipped with dose reducing technology which automatically reduces the MAS according to patient size to achieve the lowest radiation exposure possible.      Axial noncontrast 5 mm images were obtained through the brain and viewed with brain and bone windows. In addition, sagittal and coronal reconstructions were completed.    There is no evidence of intracranial hemorrhage, midline shift, hydrocephalus, or acute ischemia in a major vascular territory. There is prominence of the subarachnoid spaces and ventricular system consistent with diffuse parenchymal volume loss compatible with the patient's age. Bone windows demonstrate no definite calvarial fracture.      Impression    1. Age-appropriate central and cortical atrophy.  2. No acute intracranial process.  3. There is no calvarial fracture.  4. Moderate dural calcification is noted along the falx debris.  5. There is a large mucous retention cyst inferiorly in the right maxillary sinus.        Radiologist location ID: [id number]     CT ANGIO CHEST W IV CONTRAST     Status: None    Narrative    Male, 75 years old.    CT ANGIO CHEST W IV CONTRAST performed on 03/24/2020 3:29 PM.    REASON FOR EXAM:  CP radiating to back  RADIATION DOSE: 441 DLP  CONTRAST: 56 ml's of Isovue 370    TECHNIQUE: Enhanced 3 mm transaxial and sagittal coronal reconstruction imaging performed through the chest. 3-D angiography was performed.   The CT scanner is equipped with dose reducing technology. The MAS is automatically adjusted to the patient body size in order to deliver the lowest dose  possible.    COMPARISON: None    FINDINGS: There is contrast enhancement in the main pulmonary and right and left pulmonary arteries and no filling defects are identified. No mediastinal or hilar adenopathy is seen.    The lung window images show the lungs to be expanded.  Subpleural interstitial changes are noted in both lower lobes likely associated with dependent atelectasis. No pleural effusion seen.    Degenerative changes are noted in the thoracic spine. Very mild superior endplate couple compression deformity is noted in one midthoracic vertebral bodies likely old.    The thoracic aorta appears normal. No findings to indicate dissection is seen on the CT angiographic chest.    Images of the upper abdomen show the visualized portion of the liver and spleen appear unremarkable. There is a hypoattenuating structure in the left lobe of the liver.    Adrenal glands appear unremarkable. In the body of the pancreas there is a 6.8 cm x 4.3 cm complex cystic mass. This is not fully evaluated. CT scan of the abdomen and pelvis as a follow-up is recommended. There is evidence for anterior abdominal wall hernia repair with mesh incompletely evaluated.      Impression    1. No CT evidence of pulmonary emboli detected.  2. No acute intrathoracic process is seen.  3. 6.8 x 4.3 cm complex cystic mass is noted in the body of the pancreas. Question pancreatic neoplasm. The findings were called to the emergency Department.      Radiologist location ID: [id number]         Abnormal Lab results:  Labs Reviewed   CBC - Abnormal; Notable for the following components:       Result Value    WBC 14.5 (*)     All other components within normal limits   COMPREHENSIVE METABOLIC PANEL, NON-FASTING - Abnormal; Notable for the following components:    SODIUM 133 (*)     CO2 TOTAL 22 (*)     BUN 43 (*)     CREATININE 1.62 (*)     BUN/CREA RATIO 27 (*)     ESTIMATED GFR 41 (*)     GLUCOSE 134 (*)     All other components within normal limits    PTT (PARTIAL THROMBOPLASTIN TIME) - Abnormal; Notable for the following components:    APTT 23.3 (*)     All other components within normal limits   TROPONIN-I - Abnormal; Notable for the following components:    TROPONIN I <7 (*)     All other components within normal limits   TROPONIN-I - Abnormal; Notable for the following components:    TROPONIN I <7 (*)     All other components within normal limits   URINALYSIS, MACROSCOPIC - Abnormal; Notable for the following components:    PH 5.0 (*)     All other components within normal limits   LIPASE - Abnormal; Notable for the following components:    LIPASE 85 (*)     All other components within normal limits   LDL CHOLESTEROL, DIRECT - Normal   PT/INR - Normal   B-TYPE NATRIURETIC PEPTIDE - Normal   MAGNESIUM - Normal   COVID-19 [facility] TESTING - Normal    Narrative:     Results are for the simultaneous qualitative identification of SARS-CoV-2 (formerly 2019-nCoV), Influenza A, Influenza B, and RSV RNA. These etiologic agents are generally detectable in nasopharyngeal and nasal swabs during the ACUTE PHASE of infection. Hence, this test is intended to be performed on respiratory specimens collected from individuals with signs and symptoms of upper respiratory tract infection who meet Centers for Disease Control and Prevention (CDC) clinical and/or epidemiological criteria for Coronavirus Disease 2019 (COVID-19) testing. CDC COVID-19 criteria for testing on human specimens is available at  CDC's webpage information for Healthcare Professionals: Coronavirus Disease 2019 (COVID-19) ([employer]).     False-negative results may occur if the virus has genomic mutations, insertions, deletions, or rearrangements or if performed very early in the course of illness. Otherwise, negative results indicate virus specific RNA targets are not detected, however negative results do not preclude SARS-CoV-2 infection/COVID-19,  Influenza, or Respiratory syncytial virus infection. Results should not be used as the sole basis for patient management decisions. Negative results must be combined with clinical observations, patient history, and epidemiological information. If upper respiratory tract infection is still suspected based on exposure history together with other clinical findings, re-testing should be considered.    Disclaimer:   This assay has been authorized by FDA under an Emergency Use Authorization for use in laboratories certified under the Clinical Laboratory Improvement Amendments of 1988 (CLIA), 42 U.S.C. [phone], to perform high complexity tests. The impacts of vaccines, antiviral therapeutics, antibiotics, chemotherapeutic or immunosuppressant drugs have not been evaluated.     Test methodology:   Cepheid Xpert Xpress SARS-CoV-2/Flu/RSV Assay real-time polymerase chain reaction (RT-PCR) test on the GeneXpert Dx and Xpert Xpress systems.   TROPONIN-I       ECG: I have reviewed the ECG and my interpretation is as follows:   Most Recent EKG This Encounter   ECG 12 LEAD- TIME WITH TROPONINS    Collection Time: 03/24/20 10:32 AM   Result Value    Ventricular rate 80    Atrial Rate 80    PR Interval 144    QRS Duration 82    QT Interval 390    QTC Calculation 449    Calculated P Axis 70    Calculated R Axis 50    Calculated T Axis 60    Narrative    Normal sinus rhythm  Normal ECG     REVIEWED BY DR. Maida Sale  Confirmed by Maida Sale [phone]), editor [place], MEGAN [phone]) on 03/24/2020 12:14:11 PM         ED Course as of 03/24/20 1903   Tue Mar 24, 2020   1214 Pt reports hx CKD - believes last GFR 47, Cr 1.41. [facility] [[facility]]   6314 Pt's orthostatics positive  [HN]   9702  Consulted case management Carmell [facility] who contacted [facility]. [facility]. Okay to admit here  [HN]   1622  Patient reassessed and updated on results.  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   Patient agreeable plan for admission. [HN]   1623   Secure chat hospitalist for admission [HN]   1658 Dr Eula Fried accepted for admission to night team [HN]      ED Course User Index  [HN] Vic Blackbird, PA-C       Plan: Appropriate labs and imaging ordered. Medical Records reviewed.    MDM:   During the patient's stay in the emergency department, the above listed imaging and/or labs were performed to assist with medical decision making and were reviewed by myself when available for review.     Pt remained stable throughout the emergency department course.        Impression:   Encounter Diagnoses   Name Primary?    Chest pain Yes    Near syncope     AKI (acute kidney injury) (CMS HCC)     Pancreatic mass  Disposition:  Admitted       Patient will be admitted to hospitalist service for further evaluation and management.          This note may have been partially generated using MModal Fluency Direct system, and there may be some incorrect words, spellings, and punctuation that were not noted in checking the note prior to saving.  Vic Blackbird, PA-C  The co-signing faculty was physically present in the emergency department and available for consultation and did not participate in the care of this patient.    Vic Blackbird, PA-C  03/24/2020, 19:03

## 2020-03-24 NOTE — ED Triage Notes (Signed)
Patient reporting intermittent chest pain since Friday, worse this morning.  Reporting severe dizziness as well.

## 2020-03-24 NOTE — Nurses Notes (Signed)
Patient resting in bed with television on. Currently denies pain or discomfort. Agrees to bedside shift report. Safety measures are in place, bed is in lowest position, call light is within reach. Will continue to monitor. Smith Mince. Ouida Sills, RN  03/24/2020, 23:49

## 2020-03-25 ENCOUNTER — Observation Stay (HOSPITAL_BASED_OUTPATIENT_CLINIC_OR_DEPARTMENT_OTHER): Payer: 59

## 2020-03-25 ENCOUNTER — Observation Stay (HOSPITAL_COMMUNITY): Payer: 59 | Admitting: Radiology

## 2020-03-25 ENCOUNTER — Observation Stay (HOSPITAL_COMMUNITY): Payer: 59

## 2020-03-25 DIAGNOSIS — K8689 Other specified diseases of pancreas: Secondary | ICD-10-CM

## 2020-03-25 DIAGNOSIS — B20 Human immunodeficiency virus [HIV] disease: Secondary | ICD-10-CM

## 2020-03-25 DIAGNOSIS — N179 Acute kidney failure, unspecified: Secondary | ICD-10-CM

## 2020-03-25 DIAGNOSIS — R55 Syncope and collapse: Secondary | ICD-10-CM

## 2020-03-25 DIAGNOSIS — R079 Chest pain, unspecified: Secondary | ICD-10-CM

## 2020-03-25 DIAGNOSIS — I10 Essential (primary) hypertension: Secondary | ICD-10-CM

## 2020-03-25 DIAGNOSIS — E785 Hyperlipidemia, unspecified: Secondary | ICD-10-CM

## 2020-03-25 DIAGNOSIS — K219 Gastro-esophageal reflux disease without esophagitis: Secondary | ICD-10-CM

## 2020-03-25 DIAGNOSIS — E039 Hypothyroidism, unspecified: Secondary | ICD-10-CM

## 2020-03-25 LAB — CBC WITH DIFF
BASOPHIL #: <0.1 10*3/uL (ref <=0.20)
BASOPHIL %: 0 %
EOSINOPHIL #: <0.1 10*3/uL (ref <=0.50)
EOSINOPHIL %: 0 %
HCT: 45.5 % (ref 38.9–52.0)
HGB: 15.6 g/dL (ref 13.4–17.5)
IMMATURE GRANULOCYTE #: 0.48 10*3/uL — ABNORMAL HIGH (ref <0.10)
IMMATURE GRANULOCYTE %: 3 % — ABNORMAL HIGH (ref 0–1)
LYMPHOCYTE #: 2.1 10*3/uL (ref 1.00–4.80)
LYMPHOCYTE %: 12 %
MCH: 31.3 pg (ref 26.0–32.0)
MCHC: 34.3 g/dL (ref 31.0–35.5)
MCV: 91.2 fL (ref 78.0–100.0)
MONOCYTE #: 1.58 10*3/uL — ABNORMAL HIGH (ref 0.20–1.10)
MONOCYTE %: 9 %
MPV: 11.1 fL (ref 8.7–12.5)
NEUTROPHIL #: 13.1 10*3/uL — ABNORMAL HIGH (ref 1.50–7.70)
NEUTROPHIL %: 76 %
PLATELETS: 199 10*3/uL (ref 150–400)
RBC: 4.99 10*6/uL (ref 4.50–6.10)
RDW-CV: 13.2 % (ref 11.5–15.5)
WBC: 17.3 10*3/uL — ABNORMAL HIGH (ref 3.7–11.0)

## 2020-03-25 LAB — MYOCARDIAL PERFUSION COMPLETE
% MPHR: 88
Angina Index: 0
Baseline HR: 69 {beats}/min
Estimated workload: 7 METS
Exercise duration (min): 6 min
Exercise duration (sec): 17 s
Nuc Stress EF: 78 %
Peak Diastolic BP for Stress Tests: 78 mm/Hg
Peak Systolic BP Stress Test: 174 mm/Hg
Post peak HR: 129 {beats}/min
Predicted Max HR: 146 {beats}/min
RPP: 22446
TID: 1.38
Target HR: 124 {beats}/min

## 2020-03-25 LAB — BASIC METABOLIC PANEL
ANION GAP: 9 mmol/L (ref 4–13)
BUN/CREA RATIO: 29 — ABNORMAL HIGH (ref 6–22)
BUN: 38 mg/dL — ABNORMAL HIGH (ref 8–25)
CALCIUM: 8.8 mg/dL (ref 8.8–10.2)
CHLORIDE: 107 mmol/L (ref 96–111)
CO2 TOTAL: 20 mmol/L — ABNORMAL LOW (ref 23–31)
CREATININE: 1.31 mg/dL (ref 0.75–1.35)
ESTIMATED GFR: 53 mL/min/BSA — ABNORMAL LOW (ref >=60)
GLUCOSE: 107 mg/dL (ref 65–125)
POTASSIUM: 4.8 mmol/L (ref 3.5–5.1)
SODIUM: 136 mmol/L (ref 136–145)

## 2020-03-25 LAB — LIPID PANEL
CHOL/HDL RATIO: 4.7
CHOLESTEROL: 191 mg/dL (ref 100–200)
HDL CHOL: 41 mg/dL — ABNORMAL LOW (ref >=50)
LDL CALC: 77 mg/dL (ref <100)
NON-HDL: 150 mg/dL (ref <=190)
TRIGLYCERIDES: 367 mg/dL — ABNORMAL HIGH (ref <150)
VLDL CALC: 73 mg/dL — ABNORMAL HIGH (ref <30)

## 2020-03-25 LAB — PHOSPHORUS: PHOSPHORUS: 3.3 mg/dL (ref 2.3–4.0)

## 2020-03-25 LAB — MAGNESIUM: MAGNESIUM: 2.5 mg/dL (ref 1.8–2.6)

## 2020-03-25 MED ORDER — LACTATED RINGERS INTRAVENOUS SOLUTION
INTRAVENOUS | Status: DC
Start: 2020-03-25 — End: 2020-03-25

## 2020-03-25 MED ORDER — REGADENOSON 0.4 MG/5 ML INTRAVENOUS SYRINGE
INJECTION | INTRAVENOUS | Status: AC
Start: 2020-03-25 — End: 2020-03-25
  Filled 2020-03-25: qty 5

## 2020-03-25 NOTE — Nurses Notes (Signed)
Discharge instructions gone through and given to pt and wife, all questions answered. IV in R hand removed, catheter intact; pt tolerated well. Pt left hospital via ambulation per request, wife here to take pt home. No further concerns. Sammie Bench, RN  03/25/2020, 18:49

## 2020-03-25 NOTE — Care Management Notes (Signed)
[facility] Initial Evaluation    Patient Name: [name]  Date of Birth: 07-01-1945  Sex: male  Date/Time of Admission: 03/24/2020 11:40 AM  Room/Bed: 206/B  Payor: VETER[employer] ADMIN / Plan: [employer]MINISTRATION / Product Type: Special Bill /   PCP: [place]:   Preferred Pharmacy       None          Emergency Contact Info:   Extended Emergency Contact Information  Primary Emergency Contact: [place] Phone: [phone]  Relation: Wife  Preferred language: English  Interpreter needed? No    History:   [name] is a 75 y.o., male, admitted observation    Height/Weight: 177.8 cm (5\' 10" ) / 79.4 kg (175 lb)     LOS: 0 days   Admitting Diagnosis: Chest pain [R07.9]    Assessment: spoke with alert and oriented patient and his wife at bedside. Independent with ADLs. Stated that he goes golfing 6 days/week, as long as it is not below 30 degrees outside. No DME, supplemental O2 and denies need for [facility].     Asked for private room should he have to stay more than one night. Voiced concern of roommate listening when the doctors are speaking with patient re: medical Hx.     Dr Barton Dubois to look at results from [facility]; concerning pancreas.      03/25/20 1307   Assessment Details   Assessment Type Admission   Living Environment   Select an age group to open "lives with" row.  Adult   Lives With spouse   Living Arrangements house   Able to Return to Prior Arrangements yes   [place] Accessibility no concerns   Care Management Plan   Discharge Planning Status initial meeting   Projected Discharge Date 03/25/20   Discharge plan discussed with: Patient   Discharge Needs Assessment   Equipment Currently Used at Home none   Equipment Needed After Discharge none   Discharge Facility/Level of Care Needs Home (Patient/Family Member/other)(code 1)   Transportation Available car;family or friend will provide   ADVANCE DIRECTIVES   Does the  Patient have an Advance Directive? Yes, Patient Does Have Advance Directive for Healthcare Treatment   Copy of Advance Directives in Chart? 0       Discharge Plan:  Home (Patient/Family Member/other) (code 1)  Home. Wife to provide transportation.      The patient will continue to be evaluated for developing discharge needs.     Case Manager: Amador Cunas, CASE MANAGER  Phone: [phone]

## 2020-03-25 NOTE — Nurses Notes (Signed)
Handoff report received from [place], [place]. Pt down for testing in nuc med @ this time. Will continue to monitor. Sammie Bench, RN  03/25/2020, 08:12

## 2020-03-25 NOTE — Consults (Signed)
Cardiology Consult Note    Name: [name]                       Date of Birth: 01-Dec-1945   MRN:  [id number]                         Date of visit: 03/24/2020     PCP: [place] is a 75 y.o. year old male who presents for Chest Pain     Patient is an extremely pleasant 75 year old gentleman with no history of known coronary artery disease.  Does have a history of hypertension and hyperlipidemia.  The patient presents with 2 episodes of chest pain longest of which lasted 40 minutes.  Both these woke him from sleep and were sharp and severe underneath the left breast.  The patient has not had any worsening with exertion.  In between these episodes, he had several days in able to golf and be active and have no restriction or limitation.  With the pain, he has not been short of breath nauseous or lightheaded.  Did have mild diaphoresis.  Troponin was negative.  CT angiogram of the chest was negative for PE or aortic pathology.  A stress test was performed was symptom negative, EKG negative and showed normal perfusion imaging.  EKG has been normal as well.  The patient's echocardiogram was essentially normal also.    Patient Active Problem List    Diagnosis Date Noted   . Chest pain 03/24/2020   . Near syncope 03/24/2020   . Acute kidney injury (CMS [place]) 03/24/2020   . 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 GERD (gastroesophageal reflux disease) 03/24/2020   . Hypertension 03/24/2020   . Hypothyroidism 03/24/2020   . Hyperlipidemia 03/24/2020   . HIV (human immunodeficiency virus infection) (CMS [place]) 03/24/2020        Patient Active Problem List    Diagnosis   . Chest pain   . Near syncope   . Acute kidney injury (CMS [facility])   . Pancreatic mass   . GERD (gastroesophageal reflux disease)   . Hypertension   . Hypothyroidism   . Hyperlipidemia   . HIV (human immunodeficiency virus infection) (CMS [facility])       Past Medical History:   Diagnosis Date   . Esophageal reflux    . High  cholesterol    . HTN (hypertension)    . Hypothyroidism            Past Surgical History:   Procedure Laterality Date   . COLON SURGERY     . HX HERNIA REPAIR     . WRIST SURGERY Right            No current outpatient medications on file.       No Known Allergies    Family Medical History:    None           Social History     Tobacco Use   . Smoking status: Never Smoker   . Smokeless tobacco: Never Used   Substance Use Topics   . Alcohol use: Not Currently        REVIEW OF SYSTEMS:   Constitutional: No fevers, chills, malaise or abnormal weight loss.    HENT: No tinnitus, vertigo, hearing loss, ear pain, sinus drainage, nasal congestion or throat pain.  Eyes: No vision changes, diplopia, drainage,  pain  Respiratory: No cough, hemoptysis, wheezing  Gastrointestinal: No abdominal pain, nausea, vomiting, diarrhea, constipation, melana or hematochezia   Endocrine: No polyuria, polydipsia, heat or cold intolerance.  Genitourinary: No dysuria, urgency, frequency, hematuria, nocturia.  Musculoskeletal: No myalgias, arthralgias.  Skin: No rashes, suspicious lesions.  Neurological: No headaches, weakness, paresthesias  Hematological:  No bleeding, spontaneous bruising.  Psychiatric/Behavioral: No confusion, agitation, hallucinations, paranoia, delusions.    All other systems reviewed and are negative.    Objective: BP 128/79   Pulse 63   Temp 36.4 C (97.5 F)   Resp 19   Ht 1.778 m (5\' 10" )   Wt 79.4 kg (175 lb)   SpO2 92%   BMI 25.11 kg/m            Vitals reviewed. BP 128/79   Pulse 63   Temp 36.4 C (97.5 F)   Resp 19   Ht 1.778 m (5\' 10" )   Wt 79.4 kg (175 lb)   SpO2 92%   BMI 25.11 kg/m       Constitutional: He is oriented to person, place, and time.  He appears well-developed and well-nourished.   HEENT:  Normocephalic, atraumatic.  Nose clear with drainage.  Normal appearing nasal mucosa.   Mucous membranes moist without lesions.   Neck: Supple. Thyroid normal without enlargement or palpable nodules.   Trachea midline.  Cardiovascular: Normal rate and rhythm without murmurs, rubs or gallops.  No JVD.  No bruits.  No peripheral edema is noted.  Pulmonary/Chest: Lungs clear without wheezes, rales or rhonchi.  No chest wall tenderness or deformity.   Abdominal: Soft. Nontender, nondistended with normal bowel sounds in all quadrants. No hepatosplenomegaly. No guarding, rebound , abnormal masses.  Rectal exam deferred.  Musculoskeletal: Full and painless ROM in all joint groups.  Neurological: Alert and oriented x 4.  Cranial nerves 2-12 intact without gross defecits.  BUE's/BLE's.  Strength and sensation intact in all extremeties.   Skin: Skin is warm and dry. No rash or suspicious skin lesions noted.  Psychiatric:  Normal mood and affect. Speech is normal and behavior is normal. Judgment and thought content normal. Cognition and memory are normal.     Results for orders placed or performed during the hospital encounter of 03/24/20 (from the past 24 hour(s))   LIPID PANEL   Result Value Ref Range    CHOLESTEROL  191 100 - 200 mg/dL    HDL CHOL 41 (L) >=50 mg/dL    TRIGLYCERIDES 367 (H) <150 mg/dL    LDL CALC 77 <100 mg/dL    VLDL CALC 73 (H) <30 mg/dL    NON-HDL 150 <=190 mg/dL    CHOL/HDL RATIO 4.7    CBC/DIFF    Narrative    The following orders were created for panel order CBC/DIFF.  Procedure                               Abnormality         Status                     ---------                               -----------         ------  CBC WITH SNKN[[id number]]                Abnormal            Final result                 Please view results for these tests on the individual orders.   BASIC METABOLIC PANEL, NON-FASTING   Result Value Ref Range    SODIUM 136 136 - 145 mmol/L    POTASSIUM 4.8 3.5 - 5.1 mmol/L    CHLORIDE 107 96 - 111 mmol/L    CO2 TOTAL 20 (L) 23 - 31 mmol/L    ANION GAP 9 4 - 13 mmol/L    CALCIUM 8.8 8.8 - 10.2 mg/dL    GLUCOSE 107 65 - 125 mg/dL    BUN 38 (H) 8 - 25 mg/dL     CREATININE 1.31 0.75 - 1.35 mg/dL    BUN/CREA RATIO 29 (H) 6 - 22    ESTIMATED GFR 53 (L) >=60 mL/min/BSA   MAGNESIUM   Result Value Ref Range    MAGNESIUM 2.5 1.8 - 2.6 mg/dL   PHOSPHORUS   Result Value Ref Range    PHOSPHORUS 3.3 2.3 - 4.0 mg/dL   CBC WITH DIFF   Result Value Ref Range    WBC 17.3 (H) 3.7 - 11.0 x10^3/uL    RBC 4.99 4.50 - 6.10 x10^6/uL    HGB 15.6 13.4 - 17.5 g/dL    HCT 45.5 38.9 - 52.0 %    MCV 91.2 78.0 - 100.0 fL    MCH 31.3 26.0 - 32.0 pg    MCHC 34.3 31.0 - 35.5 g/dL    RDW-CV 13.2 11.5 - 15.5 %    PLATELETS 199 150 - 400 x10^3/uL    MPV 11.1 8.7 - 12.5 fL    NEUTROPHIL % 76 %    LYMPHOCYTE % 12 %    MONOCYTE % 9 %    EOSINOPHIL % 0 %    BASOPHIL % 0 %    NEUTROPHIL # 13.10 (H) 1.50 - 7.70 x10^3/uL    LYMPHOCYTE # 2.10 1.00 - 4.80 x10^3/uL    MONOCYTE # 1.58 (H) 0.20 - 1.10 x10^3/uL    EOSINOPHIL # <0.10 <=0.50 x10^3/uL    BASOPHIL # <0.10 <=0.20 x10^3/uL    IMMATURE GRANULOCYTE % 3 (H) 0 - 1 %    IMMATURE GRANULOCYTE # 0.48 (H) <0.10 x10^3/uL     .  Assessment/Plan    1. Chest pain    2. Near syncope    3. AKI (acute kidney injury) (CMS [facility])    4. Pancreatic mass        In conclusion, the patient is an extremely pleasant 75 year old gentleman with no prior history of known CAD though he does have cardiovascular risk factors.  The patient presents with chest discomfort which is quite atypical.  He has ruled out for acute myocardial infarction.  EKG is normal as well.  He had a normal CT angiogram of the chest and a transthoracic echocardiogram which showed no concerning abnormalities.    Patient underwent a stress test today which showed normal exercise capacity for age, symptom and EKG negativity and a normal perfusion scan.    The patient is low risk for discharge home.  If he wishes to be seen in our office, he can certainly contact us and we will see him on an as-needed basis.    Otherwise, I would suggest ongoing medical management of  his cardiovascular risk factors.  He will follow  with the [facility] system for his pancreatic mass.      Thank you very much for involving me in the care of this patient.  Please feel free to call me if I can be of any further assistance.    Jacqlyn Krauss, MD      This note may have been partially generated using MModal Fluency Direct system, and there may be some incorrect words, spellings, and punctuation that were not noted in checking the note before saving, though effort was made to avoid such errors.

## 2020-03-25 NOTE — Care Plan (Signed)
Problem: Adult Inpatient Plan of Care  Goal: Plan of Care Review  Outcome: Ongoing (see interventions/notes)  Flowsheets (Taken 03/25/2020 0435)  Plan of Care Reviewed With: patient  Progress: no change  Goal: Patient-Specific Goal (Individualized)  Outcome: Ongoing (see interventions/notes)  Flowsheets (Taken 03/25/2020 0435)  Patient-Specific Goals (Include Timeframe): discharge  Goal: Absence of Hospital-Acquired Illness or Injury  Outcome: Ongoing (see interventions/notes)  Goal: Optimal Comfort and Wellbeing  Outcome: Ongoing (see interventions/notes)  Goal: Rounds/Family Conference  Outcome: Ongoing (see interventions/notes)     Problem: Chest Pain  Goal: Resolution of Chest Pain Symptoms  Outcome: Ongoing (see interventions/notes)     Problem: Fall Injury Risk  Goal: Absence of Fall and Fall-Related Injury  Outcome: Ongoing (see interventions/notes)     Patient has rested well during the night. Medicated early in the shift for abdominal discomfort. Safety measures are in place, bed is in lowest position, call light is within reach. Will continue to monitor. Smith Mince. Ouida Sills, RN  03/25/2020, 04:36

## 2020-03-25 NOTE — Discharge Summary (Signed)
[name]  DISCHARGE SUMMARY      PATIENT NAME:  [name], [name]  MRN:  [id number]  DOB:  03-Jan-1946    ENCOUNTER DATE:  03/24/2020  INPATIENT ADMISSION DATE:   DISCHARGE DATE:  03/25/2020    ATTENDING PHYSICIAN: Bartholome Bill, DO  SERVICE: CCM HOSPITALIST  PRIMARY CARE PHYSICIAN: [place]     Reason for Admission     Diagnosis    Chest pain [357017]          DISCHARGE DIAGNOSIS:     Principal Problem:  Chest pain    Active Hospital Problems    Diagnosis Date Noted    Principle Problem: Chest pain [R07.9] 03/24/2020    Near syncope [R55] 03/24/2020    Acute kidney injury (CMS [place]) [N17.9] 03/24/2020    Pancreatic mass [K86.89] 03/24/2020    GERD (gastroesophageal reflux disease) [K21.9] 03/24/2020    Hypertension [I10] 03/24/2020    Hypothyroidism [E03.9] 03/24/2020    Hyperlipidemia [E78.5] 03/24/2020    HIV (human immunodeficiency virus infection) (CMS [place]) [B20] 03/24/2020      Resolved Hospital Problems   No resolved problems to display.     There are no active non-hospital problems to display for this patient.     No Known Allergies         DISCHARGE MEDICATIONS:     Current Discharge Medication List      CONTINUE these medications - NO CHANGES were made during your visit.      Details   Allergy Relief (cetirizine) 10 mg Tablet  Generic drug: cetirizine   10 mg, Oral, DAILY  Refills: 0     AMLODIPINE ORAL   5 mg, Oral, DAILY  Refills: 0     atorvastatin 20 mg Tablet  Commonly known as: LIPITOR   EVERY HOUR  Refills: 0     cholecalciferol (vitamin D3) 25 mcg (1,000 unit) Tablet   1,000 Units, Oral, DAILY  Refills: 0     docusate sodium 100 mg Capsule  Commonly known as: COLACE   100 mg, Oral, 2 TIMES DAILY  Refills: 0     Fish OiL 1,000 mg (120 mg-180 mg) Capsule  Generic drug: omega-3-DHA-EPA-fish oil   Oral, DAILY  Refills: 0     gabapentin 300 mg Capsule  Commonly known as: NEURONTIN   1 Tablet, Oral, 3 TIMES DAILY  Refills: 0     Genvoya 150-150-200-10 mg Tablet  Generic  drug: elviteg-cob-emtri-tenof ALAFEN   EVERY HOUR  Refills: 0     levothyroxine 88 mcg Tablet  Commonly known as: SYNTHROID   88 mcg, Oral, EVERY MORNING  Refills: 0     melatonin 10 mg Capsule   Oral  Refills: 0     omeprazole 20 mg Capsule, Delayed Release(E.C.)  Commonly known as: PRILOSEC   20 mg, Oral, 2 TIMES DAILY  Refills: 0          Discharge med list refreshed?  YES        DISCHARGE INSTRUCTIONS:   [place] In 2 weeks.    Specialty: EXTERNAL  Why: Hospital follow-up  Contact information:  [address]  [address]  [name] [address]  [phone]                          DISCHARGE INSTRUCTION - DIET     Diet: RESUME HOME DIET  DISCHARGE INSTRUCTION - ACTIVITY - GRADUALLY INCREASE AS TOLERATED     Activity: GRADUALLY INCREASE ACTIVITY AS TOLERATED      DISCHARGE INSTRUCTION - MISC    Follow-up with the [facility] in [place] and/or [place] to follow-up the history of the pancreatic mass/pseudocyst to ensure stability and continued monitoring          [place] COURSE:      75 year old male past medical history significant for GERD hyperlipidemia hypertension hypothyroidism, HIV who presented to the hospital with a chief complaint of chest pain.  Patient was ruled out for acute coronary syndrome with negative troponins and no EKG changes, underwent CTA of the chest which was negative for PE or any other acute process, he was evaluated by Cardiology who states chest pain was quite atypical., transthoracic echocardiogram was normal, stress test showing normal exercise capacity for age symptom in EKG negative and a normal perfusion scan.  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  Patient had remained chest pain-free, he was hemodynamically stable without significant laboratory abnormalities and he will be discharged home in stable condition, cardiology recommends he can follow-up as needed with them or follow-up with the [facility].  Patient was discharged home in stable condition.    Physical Exam  Vitals and nursing note reviewed.   Constitutional:       General: He is not in acute distress.     Appearance: He is not toxic-appearing.   HENT:      Head: Normocephalic and atraumatic.      Nose: No congestion or rhinorrhea.      Mouth/Throat:      Mouth: Mucous membranes are moist.      Pharynx: Oropharynx is clear.   Cardiovascular:      Rate and Rhythm: Normal rate and regular rhythm.      Heart sounds: No murmur heard.  Pulmonary:      Effort: Pulmonary effort is normal. No respiratory distress.      Breath sounds: Normal breath sounds. No wheezing.   Abdominal:      General: Bowel sounds are normal. There is no distension.      Palpations: Abdomen is soft.      Tenderness: There is no abdominal tenderness.   Musculoskeletal:         General: No swelling. Normal range of motion.      Cervical back: Neck supple. No tenderness.      Right lower leg: No edema.      Left lower leg: No edema.   Skin:     General: Skin is warm and dry.      Capillary Refill: Capillary refill takes less than 2 seconds.      Findings: No lesion or rash.   Neurological:      General: No focal deficit present.      Mental Status: He is alert and oriented to person, place, and time. Mental status is at baseline.      Cranial Nerves: No cranial nerve  deficit.      Motor: No weakness.   Psychiatric:         Mood and Affect: Mood normal.         Behavior: Behavior normal.         Judgment:  Judgment normal.         CONDITION ON DISCHARGE:  A. Ambulation: Full ambulation  B. Self-care Ability: Complete  C. Cognitive Status Alert and Oriented x 3  D. DNR status at discharge: Full Code  E. Lace + Score: 60 (03/25/20 0532)       Advance Directive Information    Flowsheet Row Most Recent Value   Does the Patient have an Advance Directive? Yes, Patient Does Have Advance Directive for Healthcare Treatment   Copy of Advance Directives in Chart? Yes, Copy on Chart.(Specify in [place] Directive)          DISCHARGE DISPOSITION:    DISCHARGE PATIENT   Ordered at: 03/25/20 1404     Is there a planned readmission to acute care within 30 days?    No     Disposition:    HOME/SELF CARE/WITH FAMILY MEMBER/OTHER       Bartholome Bill, DO    Time spent on discharge greater than 30 minutes? Yes      Copies sent to Care Team       Relationship Specialty Notifications [place] PCP - General EXTERNAL  03/19/18     Phone: [phone] Fax: [phone]         [address] [place] [address]          Referring providers can utilize [email or web address] to access their referred [place] patient's information.

## 2020-04-19 ENCOUNTER — Encounter (HOSPITAL_COMMUNITY): Payer: Self-pay

## 2020-04-19 ENCOUNTER — Other Ambulatory Visit: Payer: Self-pay

## 2020-04-19 ENCOUNTER — Observation Stay (HOSPITAL_COMMUNITY): Payer: 59

## 2020-04-19 ENCOUNTER — Emergency Department (HOSPITAL_COMMUNITY): Payer: 59

## 2020-04-19 ENCOUNTER — Emergency Department (HOSPITAL_COMMUNITY): Payer: 59 | Admitting: Radiology

## 2020-04-19 ENCOUNTER — Inpatient Hospital Stay (HOSPITAL_COMMUNITY): Payer: 59 | Admitting: Internal Medicine

## 2020-04-19 ENCOUNTER — Inpatient Hospital Stay
Admission: EM | Admit: 2020-04-19 | Discharge: 2020-04-28 | DRG: 854 | Disposition: A | Discharge status: Home Health | Payer: 59 | Attending: Internal Medicine | Admitting: Internal Medicine

## 2020-04-19 DIAGNOSIS — K8689 Other specified diseases of pancreas: Secondary | ICD-10-CM

## 2020-04-19 DIAGNOSIS — B958 Unspecified staphylococcus as the cause of diseases classified elsewhere: Secondary | ICD-10-CM | POA: Diagnosis present

## 2020-04-19 DIAGNOSIS — Z20822 Contact with and (suspected) exposure to covid-19: Secondary | ICD-10-CM | POA: Diagnosis present

## 2020-04-19 DIAGNOSIS — K4190 Unilateral femoral hernia, without obstruction or gangrene, not specified as recurrent: Secondary | ICD-10-CM | POA: Diagnosis present

## 2020-04-19 DIAGNOSIS — N390 Urinary tract infection, site not specified: Secondary | ICD-10-CM

## 2020-04-19 DIAGNOSIS — M00051 Staphylococcal arthritis, right hip: Secondary | ICD-10-CM | POA: Diagnosis present

## 2020-04-19 DIAGNOSIS — N3000 Acute cystitis without hematuria: Secondary | ICD-10-CM | POA: Diagnosis present

## 2020-04-19 DIAGNOSIS — Z79890 Hormone replacement therapy (postmenopausal): Secondary | ICD-10-CM

## 2020-04-19 DIAGNOSIS — B9561 Methicillin susceptible Staphylococcus aureus infection as the cause of diseases classified elsewhere: Secondary | ICD-10-CM | POA: Diagnosis present

## 2020-04-19 DIAGNOSIS — A4101 Sepsis due to Methicillin susceptible Staphylococcus aureus: Principal | ICD-10-CM | POA: Diagnosis present

## 2020-04-19 DIAGNOSIS — E785 Hyperlipidemia, unspecified: Secondary | ICD-10-CM

## 2020-04-19 DIAGNOSIS — M5416 Radiculopathy, lumbar region: Secondary | ICD-10-CM | POA: Diagnosis present

## 2020-04-19 DIAGNOSIS — K219 Gastro-esophageal reflux disease without esophagitis: Secondary | ICD-10-CM | POA: Diagnosis present

## 2020-04-19 DIAGNOSIS — I10 Essential (primary) hypertension: Secondary | ICD-10-CM | POA: Diagnosis present

## 2020-04-19 DIAGNOSIS — B20 Human immunodeficiency virus [HIV] disease: Secondary | ICD-10-CM | POA: Diagnosis present

## 2020-04-19 DIAGNOSIS — R1030 Lower abdominal pain, unspecified: Secondary | ICD-10-CM

## 2020-04-19 DIAGNOSIS — Z8546 Personal history of malignant neoplasm of prostate: Secondary | ICD-10-CM

## 2020-04-19 DIAGNOSIS — E039 Hypothyroidism, unspecified: Secondary | ICD-10-CM | POA: Diagnosis present

## 2020-04-19 DIAGNOSIS — Z79899 Other long term (current) drug therapy: Secondary | ICD-10-CM

## 2020-04-19 DIAGNOSIS — E782 Mixed hyperlipidemia: Secondary | ICD-10-CM | POA: Diagnosis present

## 2020-04-19 DIAGNOSIS — N179 Acute kidney failure, unspecified: Secondary | ICD-10-CM | POA: Diagnosis present

## 2020-04-19 DIAGNOSIS — M79606 Pain in leg, unspecified: Secondary | ICD-10-CM

## 2020-04-19 DIAGNOSIS — K869 Disease of pancreas, unspecified: Secondary | ICD-10-CM | POA: Diagnosis present

## 2020-04-19 DIAGNOSIS — I330 Acute and subacute infective endocarditis: Secondary | ICD-10-CM

## 2020-04-19 DIAGNOSIS — Z21 Asymptomatic human immunodeficiency virus [HIV] infection status: Secondary | ICD-10-CM | POA: Diagnosis present

## 2020-04-19 DIAGNOSIS — K4090 Unilateral inguinal hernia, without obstruction or gangrene, not specified as recurrent: Secondary | ICD-10-CM | POA: Diagnosis present

## 2020-04-19 LAB — COMPREHENSIVE METABOLIC PANEL, NON-FASTING
ALBUMIN: 2.9 g/dL — ABNORMAL LOW (ref 3.4–4.8)
ALKALINE PHOSPHATASE: 100 U/L (ref 45–115)
ALT (SGPT): 34 U/L (ref 10–55)
ANION GAP: 13 mmol/L (ref 4–13)
AST (SGOT): 13 U/L (ref 8–45)
BILIRUBIN TOTAL: 0.8 mg/dL (ref 0.3–1.3)
BUN/CREA RATIO: 15 (ref 6–22)
BUN: 18 mg/dL (ref 8–25)
CALCIUM: 8.9 mg/dL (ref 8.8–10.2)
CHLORIDE: 97 mmol/L (ref 96–111)
CO2 TOTAL: 25 mmol/L (ref 23–31)
CREATININE: 1.23 mg/dL (ref 0.75–1.35)
ESTIMATED GFR: 57 mL/min/BSA — ABNORMAL LOW (ref >=60)
GLUCOSE: 134 mg/dL — ABNORMAL HIGH (ref 65–125)
POTASSIUM: 4.5 mmol/L (ref 3.5–5.1)
PROTEIN TOTAL: 7.2 g/dL (ref 6.0–8.0)
SODIUM: 135 mmol/L — ABNORMAL LOW (ref 136–145)

## 2020-04-19 LAB — CBC WITH DIFF
BASOPHIL #: <0.1 10*3/uL (ref <=0.20)
BASOPHIL %: 0 %
EOSINOPHIL #: <0.1 10*3/uL (ref <=0.50)
EOSINOPHIL %: 0 %
HCT: 43.8 % (ref 38.9–52.0)
HGB: 15 g/dL (ref 13.4–17.5)
IMMATURE GRANULOCYTE #: 0.17 10*3/uL — ABNORMAL HIGH (ref <0.10)
IMMATURE GRANULOCYTE %: 1 % (ref 0–1)
LYMPHOCYTE #: 1.22 10*3/uL (ref 1.00–4.80)
LYMPHOCYTE %: 10 %
MCH: 31.9 pg (ref 26.0–32.0)
MCHC: 34.2 g/dL (ref 31.0–35.5)
MCV: 93.2 fL (ref 78.0–100.0)
MONOCYTE #: 0.8 10*3/uL (ref 0.20–1.10)
MONOCYTE %: 6 %
MPV: 9.5 fL (ref 8.7–12.5)
NEUTROPHIL #: 10.18 10*3/uL — ABNORMAL HIGH (ref 1.50–7.70)
NEUTROPHIL %: 83 %
PLATELETS: 207 10*3/uL (ref 150–400)
RBC: 4.7 10*6/uL (ref 4.50–6.10)
RDW-CV: 13.7 % (ref 11.5–15.5)
WBC: 12.4 10*3/uL — ABNORMAL HIGH (ref 3.7–11.0)

## 2020-04-19 LAB — URINALYSIS, MACROSCOPIC
BILIRUBIN: NOT DETECTED mg/dL
BLOOD: NOT DETECTED mg/dL
GLUCOSE: NOT DETECTED mg/dL
KETONES: NOT DETECTED mg/dL
NITRITE: NOT DETECTED
PH: 7 (ref 5.0–8.0)
SPECIFIC GRAVITY: 1.018 (ref 1.005–1.030)
UROBILINOGEN: NOT DETECTED mg/dL

## 2020-04-19 LAB — COVID-19, FLU A/B, RSV RAPID BY PCR
INFLUENZA VIRUS TYPE A: NOT DETECTED
INFLUENZA VIRUS TYPE B: NOT DETECTED
RESPIRATORY SYNCTIAL VIRUS (RSV): NOT DETECTED
SARS-CoV-2: NOT DETECTED

## 2020-04-19 LAB — URINALYSIS, MICROSCOPIC

## 2020-04-19 LAB — CREATINE KINASE (CK), TOTAL, SERUM: CREATINE KINASE: 31 U/L — ABNORMAL LOW (ref 45–225)

## 2020-04-19 MED ORDER — HYDROCODONE 5 MG-ACETAMINOPHEN 325 MG TABLET
1.0000 | ORAL_TABLET | ORAL | Status: DC | PRN
Start: 2020-04-19 — End: 2020-04-28
  Administered 2020-04-19 – 2020-04-28 (×29): 1 via ORAL
  Filled 2020-04-19 (×32): qty 1

## 2020-04-19 MED ORDER — HYDROMORPHONE 0.5 MG/0.5 ML INJECTION SYRINGE
0.5000 mg | INJECTION | INTRAMUSCULAR | Status: AC
Start: 2020-04-19 — End: 2020-04-19
  Administered 2020-04-19: 0.5 mg via INTRAVENOUS
  Filled 2020-04-19: qty 0.5

## 2020-04-19 MED ORDER — ACETAMINOPHEN 500 MG TABLET
500.0000 mg | ORAL_TABLET | ORAL | Status: DC | PRN
Start: 2020-04-19 — End: 2020-04-28
  Administered 2020-04-19 (×2): 500 mg via ORAL
  Filled 2020-04-19: qty 1

## 2020-04-19 MED ORDER — MORPHINE 4 MG/ML INTRAVENOUS SOLUTION
4.0000 mg | INTRAVENOUS | Status: AC
Start: 2020-04-19 — End: 2020-04-19
  Administered 2020-04-19: 10:00:00 4 mg via INTRAVENOUS
  Filled 2020-04-19: qty 1

## 2020-04-19 MED ORDER — HYDROMORPHONE 0.5 MG/0.5 ML INJECTION SYRINGE
0.5000 mg | INJECTION | INTRAMUSCULAR | Status: DC | PRN
Start: 2020-04-19 — End: 2020-04-20
  Administered 2020-04-19 – 2020-04-20 (×6): 0.5 mg via INTRAVENOUS
  Filled 2020-04-19 (×6): qty 0.5

## 2020-04-19 MED ORDER — ATORVASTATIN 10 MG TABLET
20.0000 mg | ORAL_TABLET | Freq: Every evening | ORAL | Status: DC
Start: 2020-04-19 — End: 2020-04-28
  Administered 2020-04-19 – 2020-04-27 (×9): 20 mg via ORAL
  Filled 2020-04-19 (×9): qty 2

## 2020-04-19 MED ORDER — IOPAMIDOL 370 MG IODINE/ML (76 %) INTRAVENOUS SOLUTION
50.0000 mL | INTRAVENOUS | Status: AC
Start: 2020-04-19 — End: 2020-04-19
  Administered 2020-04-19: 17:00:00 50 mL via INTRAVENOUS

## 2020-04-19 MED ORDER — LEVOTHYROXINE 88 MCG TABLET
88.0000 ug | ORAL_TABLET | Freq: Every morning | ORAL | Status: DC
Start: 2020-04-20 — End: 2020-04-28
  Administered 2020-04-20 – 2020-04-28 (×9): 88 ug via ORAL
  Filled 2020-04-19 (×9): qty 1

## 2020-04-19 MED ORDER — DIPHENHYDRAMINE 25 MG CAPSULE
25.0000 mg | ORAL_CAPSULE | Freq: Every evening | ORAL | Status: DC | PRN
Start: 2020-04-19 — End: 2020-04-28

## 2020-04-19 MED ORDER — MELATONIN 3 MG TABLET
10.0000 mg | ORAL_TABLET | Freq: Every evening | ORAL | Status: DC
Start: 2020-04-19 — End: 2020-04-28
  Administered 2020-04-19 – 2020-04-27 (×9): 10.5 mg via ORAL
  Filled 2020-04-19 (×9): qty 4

## 2020-04-19 MED ORDER — DOCUSATE SODIUM 100 MG CAPSULE
100.0000 mg | ORAL_CAPSULE | Freq: Two times a day (BID) | ORAL | Status: DC
Start: 2020-04-19 — End: 2020-04-28
  Administered 2020-04-19 – 2020-04-22 (×6): 100 mg via ORAL
  Administered 2020-04-22: 0 mg via ORAL
  Administered 2020-04-23 – 2020-04-26 (×7): 100 mg via ORAL
  Administered 2020-04-26: 0 mg via ORAL
  Administered 2020-04-27: 100 mg via ORAL
  Administered 2020-04-27: 0 mg via ORAL
  Administered 2020-04-28: 09:00:00 100 mg via ORAL
  Filled 2020-04-19 (×17): qty 1

## 2020-04-19 MED ORDER — MAGNESIUM HYDROXIDE 400 MG/5 ML ORAL SUSPENSION
15.0000 mL | Freq: Every day | ORAL | Status: DC | PRN
Start: 2020-04-19 — End: 2020-04-28
  Administered 2020-04-20 – 2020-04-23 (×2): 1200 mg via ORAL
  Filled 2020-04-19 (×2): qty 30

## 2020-04-19 MED ORDER — GABAPENTIN 100 MG CAPSULE
300.0000 mg | ORAL_CAPSULE | Freq: Three times a day (TID) | ORAL | Status: DC
Start: 2020-04-19 — End: 2020-04-28
  Administered 2020-04-19 – 2020-04-21 (×7): 300 mg via ORAL
  Administered 2020-04-22: 0 mg via ORAL
  Administered 2020-04-22: 300 mg via ORAL
  Administered 2020-04-22: 0 mg via ORAL
  Administered 2020-04-23 – 2020-04-28 (×17): 300 mg via ORAL
  Filled 2020-04-19 (×25): qty 3

## 2020-04-19 MED ORDER — ONDANSETRON HCL (PF) 4 MG/2 ML INJECTION SOLUTION
4.0000 mg | INTRAMUSCULAR | Status: AC
Start: 2020-04-19 — End: 2020-04-19
  Administered 2020-04-19: 10:00:00 4 mg via INTRAVENOUS
  Filled 2020-04-19: qty 2

## 2020-04-19 MED ORDER — MORPHINE 4 MG/ML INTRAVENOUS SOLUTION
4.0000 mg | INTRAVENOUS | Status: AC
Start: 2020-04-19 — End: 2020-04-19
  Administered 2020-04-19 (×2): 4 mg via INTRAVENOUS
  Filled 2020-04-19: qty 1

## 2020-04-19 MED ORDER — IOPAMIDOL 370 MG IODINE/ML (76 %) INTRAVENOUS SOLUTION
100.0000 mL | INTRAVENOUS | Status: AC
Start: 2020-04-19 — End: 2020-04-19
  Administered 2020-04-19: 13:00:00 100 mL via INTRAVENOUS

## 2020-04-19 MED ORDER — CEFTRIAXONE 1G IN NS 50ML MINIBAG IVPB
1.0000 g | INTRAVENOUS | Status: AC
Start: 2020-04-19 — End: 2020-04-19
  Administered 2020-04-19: 0 g via INTRAVENOUS
  Administered 2020-04-19: 13:00:00 1 g via INTRAVENOUS
  Filled 2020-04-19: qty 50

## 2020-04-19 MED ORDER — ENOXAPARIN 40 MG/0.4 ML SUBCUTANEOUS SYRINGE
40.0000 mg | INJECTION | SUBCUTANEOUS | Status: DC
Start: 2020-04-19 — End: 2020-04-28
  Administered 2020-04-19 – 2020-04-27 (×9): 40 mg via SUBCUTANEOUS
  Administered 2020-04-28: 18:00:00 0 mg via SUBCUTANEOUS
  Filled 2020-04-19 (×9): qty 0.4

## 2020-04-19 MED ORDER — HYDROMORPHONE 0.5 MG/0.5 ML INJECTION SYRINGE
0.5000 mg | INJECTION | INTRAMUSCULAR | Status: AC
Start: 2020-04-19 — End: 2020-04-19
  Administered 2020-04-19 (×2): 0.5 mg via INTRAVENOUS
  Filled 2020-04-19: qty 0.5

## 2020-04-19 MED ORDER — SODIUM CHLORIDE 0.9 % (FLUSH) INJECTION SYRINGE
3.0000 mL | INJECTION | Freq: Three times a day (TID) | INTRAMUSCULAR | Status: DC
Start: 2020-04-19 — End: 2020-04-23
  Administered 2020-04-19: 14:00:00 0 mL
  Administered 2020-04-19: 21:00:00 3 mL
  Administered 2020-04-20: 0 mL
  Administered 2020-04-20 (×2): 3 mL
  Administered 2020-04-21 (×2): 0 mL
  Administered 2020-04-21: 3 mL
  Administered 2020-04-22 – 2020-04-23 (×4): 0 mL

## 2020-04-19 MED ORDER — SODIUM CHLORIDE 0.9 % (FLUSH) INJECTION SYRINGE
3.0000 mL | INJECTION | INTRAMUSCULAR | Status: DC | PRN
Start: 2020-04-19 — End: 2020-04-23

## 2020-04-19 MED ORDER — ELVITEG 150 MG-COB 150 MG-EMTRICIT 200 MG-TENOFO ALAFENAM 10 MG TABLET
1.0000 | ORAL_TABLET | Freq: Every day | ORAL | Status: DC
Start: 2020-04-19 — End: 2020-04-20
  Administered 2020-04-19 – 2020-04-20 (×2): 0 via ORAL

## 2020-04-19 MED ORDER — CEFTRIAXONE 1 GRAM/50 ML IN DEXTROSE (ISO-OSMOT) INTRAVENOUS PIGGYBACK
1.0000 g | INJECTION | INTRAVENOUS | Status: DC
Start: 2020-04-19 — End: 2020-04-19
  Filled 2020-04-19: qty 50

## 2020-04-19 MED ORDER — ONDANSETRON HCL (PF) 4 MG/2 ML INJECTION SOLUTION
4.0000 mg | Freq: Four times a day (QID) | INTRAMUSCULAR | Status: DC | PRN
Start: 2020-04-19 — End: 2020-04-28

## 2020-04-19 MED ORDER — SODIUM CHLORIDE 0.9 % IV BOLUS
1000.0000 mL | INJECTION | Status: AC
Start: 2020-04-19 — End: 2020-04-19
  Administered 2020-04-19: 10:00:00 1000 mL via INTRAVENOUS
  Administered 2020-04-19: 11:00:00 0 mL via INTRAVENOUS

## 2020-04-19 MED ORDER — IOPAMIDOL 370 MG IODINE/ML (76 %) INTRAVENOUS SOLUTION
50.0000 mL | INTRAVENOUS | Status: DC
Start: 2020-04-19 — End: 2020-04-28
  Administered 2020-04-19: 17:00:00 0 mL via INTRAVENOUS

## 2020-04-19 MED ORDER — AMLODIPINE 5 MG TABLET
5.0000 mg | ORAL_TABLET | Freq: Every day | ORAL | Status: DC
Start: 2020-04-19 — End: 2020-04-28
  Administered 2020-04-19 – 2020-04-28 (×10): 5 mg via ORAL
  Filled 2020-04-19 (×10): qty 1

## 2020-04-19 MED ORDER — SODIUM CHLORIDE 0.9 % (FLUSH) INJECTION SYRINGE
3.0000 mL | INJECTION | Freq: Three times a day (TID) | INTRAMUSCULAR | Status: DC
Start: 2020-04-19 — End: 2020-04-23
  Administered 2020-04-19: 21:00:00 3 mL
  Administered 2020-04-19: 0 mL
  Administered 2020-04-20: 3 mL
  Administered 2020-04-20: 0 mL
  Administered 2020-04-20 – 2020-04-21 (×3): 3 mL
  Administered 2020-04-21 – 2020-04-22 (×2): 0 mL
  Administered 2020-04-22 (×2): 3 mL
  Administered 2020-04-23: 0 mL

## 2020-04-19 MED ORDER — SODIUM CHLORIDE 0.9 % (FLUSH) INJECTION SYRINGE
3.0000 mL | INJECTION | INTRAMUSCULAR | Status: DC | PRN
Start: 2020-04-19 — End: 2020-04-23
  Administered 2020-04-21 – 2020-04-22 (×2): 3 mL

## 2020-04-19 NOTE — ED Provider Notes (Signed)
Emergency Department  Provider Note  HPI - 04/19/2020    COVID-19 PANDEMIC IN EFFECT    History and Physical Exam     [name] 75 y.o. male  Date of Birth: November 12, 1945     Attending: Dr. Hoover Browns  Scribe: Karma Lew    PCP: [facility]      HPI:    Visit Reason:   Chief Complaint   Patient presents with   . Groin Pain       Entered the patient's room for initial exam at 0914 hours.    History provided by: Patient    [name] is a 75 y.o. male has a persistent history of HTN and high cholesterol. Patient presents to the ED today for right groin pain.     Patient arrives to the ED today via private vehicle from home with Wife at bedside. Patient reports having right groin pain that started on 04/11/2020 while carrying both a base of water and base of tea up a set of stairs at his home. Patient notes he "felt something snap and now it's starting to turn purple". Patient endorses mild right groin pain radiates to top of thigh, down calf, and into the top of foot with edema starting. Patient says he tried ice for the first 30 hours and heat with mild changes and expresses having compression on his right thigh decreases the pain. Patient reports the following Wednesday he went golfing and had an increase of pain Thursday evening with limited ambulation. Patient denies back pain, chest pain, testicular pain, and all other symptoms. Patient has no other complaints at this time.     Patient has not had any recent known sick exposures. Patient denies having any fever, head pain, neck pain, back pain, chest pain, cough, SOB, abdominal pain, n/v/d, or GU symptoms. Patient does not have any other associated s/s at this time.    There are no other complaints or concerns noted at this time.    Location: [facility]  Quality: Right groin pain  Onset: 04/11/2020  Severity: Moderate  Timing: Still present  Context: See HPI  Modifying factors: Ice and heat with mild changes, compression with decrease of  pain  Associated symptoms: (+) Right groin pain radiating to top of thigh, down calf, and into the top of foot with edema (-) fever, head pain, neck pain, back pain, chest pain, cough, SOB, testicular pain, abdominal pain, n/v/d, or GU symptoms    Review of Systems:    Constitutional: (-) fever, chills  Skin: (-) rashes, lesions  HENT: (-) sore throat, ear pain, difficulty swallowing  Eyes: (-) vision changes, redness, discharge  Cardio: (-) chest pain, (-) palpitations   Respiratory: (-) cough, wheezing, SOB  GI:  (-) nausea, vomiting, diarrhea, constipation, abdominal pain  GU:  (-) dysuria, hematuria, polyuria, testicular pain  [facility]: (+) Right groin pain radiating to top of thigh, down calf, and into the top of foot with edema. No neck pain, back pain  Neuro: (-) numbness, tingling, weakness, headache  Psych: (-) SI, HI, anxiety, depression.  All other systems reviewed and are negative, unless commented on in the HPI.     Past Medical History     PMHx:    Medical History     Diagnosis Date Comment Source    Esophageal reflux       High cholesterol       HTN (hypertension)       Hypothyroidism  Allergies:    No Known Allergies  Social History  Social History     Tobacco Use   . Smoking status: Never Smoker   . Smokeless tobacco: Never Used   Substance Use Topics   . Alcohol use: Not Currently     Family History  Family Medical History:    None        Home Meds:     Current Facility-Administered Medications:   .  HYDROmorphone (DILAUDID) 0.5 mg/0.5 mL injection, 0.5 mg, Intravenous, Now, Childers, Noah S, DO  .  NS flush syringe, 3 mL, Intracatheter, Q8HRS, Childers, Noah S, DO  .  NS flush syringe, 3 mL, Intracatheter, Q1H PRN, Childers, Noah S, DO    Current Outpatient Medications:   .  amlodipine besylate (AMLODIPINE ORAL), Take 5 mg by mouth Once a day, Disp: , Rfl:   .  atorvastatin (LIPITOR) 20 mg Oral Tablet, Every one hour, Disp: , Rfl:   .  cetirizine (ALLERGY RELIEF, CETIRIZINE,) 10 mg Oral Tablet,  Take 10 mg by mouth Once a day, Disp: , Rfl:   .  cholecalciferol, vitamin D3, 25 mcg (1,000 unit) Oral Tablet, Take 1,000 Units by mouth Once a day, Disp: , Rfl:   .  docusate sodium (COLACE) 100 mg Oral Capsule, Take 100 mg by mouth Twice daily, Disp: , Rfl:   .  elviteg-cob-emtri-tenof ALAFEN (GENVOYA) 150-150-200-10 mg Oral Tablet, Every one hour, Disp: , Rfl:   .  gabapentin (NEURONTIN) 300 mg Oral Capsule, Take 1 Tab by mouth Three times a day, Disp: , Rfl:   .  levothyroxine (SYNTHROID) 88 mcg Oral Tablet, Take 88 mcg by mouth Every morning, Disp: , Rfl:   .  melatonin 10 mg Oral Capsule, Take by mouth, Disp: , Rfl:   .  omega-3-DHA-EPA-fish oil (FISH OIL) 1,000 mg (120 mg-180 mg) Oral Capsule, Take by mouth Once a day, Disp: , Rfl:   .  omeprazole (PRILOSEC) 20 mg Oral Capsule, Delayed Release(E.C.), Take 20 mg by mouth Twice daily, Disp: , Rfl:           Exam and Objective Findings     Nursing notes reviewed. Old records reviewed.    Filed Vitals:    04/19/20 0945 04/19/20 1115 04/19/20 1145 04/19/20 1200   BP: 137/79 139/71 132/73 128/73   Pulse: 98 (!) 101 96 95   Resp: 20 20 17 16    Temp:       SpO2: 94% 95% 95% 95%         Physical Exam  Nursing note and vitals reviewed.  Vital signs reviewed as above.     Constitutional: Pt is awake, alert, and in no acute distress.   Skin: Warm and dry. No rash or lesions.  HENT: Atraumatic. Moist mucous membranes. No pharyngeal erythema. Normal TM's.  Eyes: Conjunctivae are normal. Pupils are equal, round, and reactive to light.  Cardiovascular: Regular rate. Normal rhythm. Distal pulses intact bilaterally. No leg swelling.  Respiratory: Breath sounds normal. Effort normal. No respiratory distress.   GI/abdomen: Abdomen is soft, nontender, nondistended. Bowel sounds are normal. No rebound, guarding, or masses.   [facility]/Extremities: Tenderness to palpation of the right hip. Trace edema in right lower extremity. +2 pulses. Normal range of motion. No redness.    Neurological: Patient is alert and oriented to person, place and time. Cranial nerves 2-12 are grossly intact.  Psychiatric: Patient has a normal mood and affect.     Work-up:  Orders Placed This  Encounter   . URINE CULTURE   . XR HIP RIGHT W PELVIS 2-3 VIEWS   . CT ABDOMEN PELVIS W IV CONTRAST   . CT FEMUR / THIGH RIGHT WO IV CONTRAST   . CBC/DIFF   . COMPREHENSIVE METABOLIC PANEL, NON-FASTING   . URINALYSIS, MACROSCOPIC   . CBC WITH DIFF   . URINALYSIS, MICROSCOPIC   . CREATINE KINASE (CK), TOTAL, SERUM   . COVID-19, FLU A/B, RSV RAPID BY PCR   . PERFORM POC ISTAT CREATININE POINT OF CARE   . INSERT & MAINTAIN PERIPHERAL IV ACCESS   . PERIPHERAL IV DRESSING CHANGE   . PERIPHERAL VENOUS DUPLEX - LOWER   . NS flush syringe   . NS flush syringe   . morphine 4 mg/mL injection   . ondansetron (ZOFRAN) 2 mg/mL injection   . NS bolus infusion 1,000 mL   . morphine 4 mg/mL injection   . cefTRIAXone (ROCEPHIN) 1g in NS 21mL minibag IVPB   . iopamidol (ISOVUE-370) 76% infusion   . HYDROmorphone (DILAUDID) 0.5 mg/0.5 mL injection        Labs:  Results for orders placed or performed during the hospital encounter of 04/19/20 (from the past 24 hour(s))   CBC/DIFF    Narrative    The following orders were created for panel order CBC/DIFF.  Procedure                               Abnormality         Status                     ---------                               -----------         ------                     CBC WITH BJYN[[id number]]                Abnormal            Final result                 Please view results for these tests on the individual orders.   COMPREHENSIVE METABOLIC PANEL, NON-FASTING   Result Value Ref Range    SODIUM 135 (L) 136 - 145 mmol/L    POTASSIUM 4.5 3.5 - 5.1 mmol/L    CHLORIDE 97 96 - 111 mmol/L    CO2 TOTAL 25 23 - 31 mmol/L    ANION GAP 13 4 - 13 mmol/L    BUN 18 8 - 25 mg/dL    CREATININE 1.23 0.75 - 1.35 mg/dL    BUN/CREA RATIO 15 6 - 22    ESTIMATED GFR 57 (L) >=60 mL/min/BSA    ALBUMIN 2.9  (L) 3.4 - 4.8 g/dL     CALCIUM 8.9 8.8 - 10.2 mg/dL    GLUCOSE 134 (H) 65 - 125 mg/dL    ALKALINE PHOSPHATASE 100 45 - 115 U/L    ALT (SGPT) 34 10 - 55 U/L    AST (SGOT)  13 8 - 45 U/L    BILIRUBIN TOTAL 0.8 0.3 - 1.3 mg/dL    PROTEIN TOTAL 7.2 6.0 - 8.0 g/dL   URINALYSIS, MACROSCOPIC   Result Value Ref Range  COLOR Yellow Colorless, Straw, Yellow    APPEARANCE Hazy (A) Clear    SPECIFIC GRAVITY 1.018 1.005 - 1.030    PH 7.0 5.0 - 8.0    PROTEIN 2+ (A) Not Detected mg/dL    GLUCOSE Not Detected Not Detected mg/dL    KETONES Not Detected Not Detected mg/dL    UROBILINOGEN Not Detected Not Detected mg/dL    BILIRUBIN Not Detected Not Detected mg/dL    BLOOD Not Detected Not Detected mg/dL    NITRITE Not Detected Not Detected    LEUKOCYTES 1+ (A) Not Detected WBCs/uL   CBC WITH DIFF   Result Value Ref Range    WBC 12.4 (H) 3.7 - 11.0 x10^3/uL    RBC 4.70 4.50 - 6.10 x10^6/uL    HGB 15.0 13.4 - 17.5 g/dL    HCT 43.8 38.9 - 52.0 %    MCV 93.2 78.0 - 100.0 fL    MCH 31.9 26.0 - 32.0 pg    MCHC 34.2 31.0 - 35.5 g/dL    RDW-CV 13.7 11.5 - 15.5 %    PLATELETS 207 150 - 400 x10^3/uL    MPV 9.5 8.7 - 12.5 fL    NEUTROPHIL % 83 %    LYMPHOCYTE % 10 %    MONOCYTE % 6 %    EOSINOPHIL % 0 %    BASOPHIL % 0 %    NEUTROPHIL # 10.18 (H) 1.50 - 7.70 x10^3/uL    LYMPHOCYTE # 1.22 1.00 - 4.80 x10^3/uL    MONOCYTE # 0.80 0.20 - 1.10 x10^3/uL    EOSINOPHIL # <0.10 <=0.50 x10^3/uL    BASOPHIL # <0.10 <=0.20 x10^3/uL    IMMATURE GRANULOCYTE % 1 0 - 1 %    IMMATURE GRANULOCYTE # 0.17 (H) <0.10 x10^3/uL   URINALYSIS, MICROSCOPIC   Result Value Ref Range    WBCS 51-100 (A) 0-5, None /hpf    RBCS 0-5 None, 0-5 /hpf    AMORPHOUS SEDIMENT Rare (A) None, Mod /hpf    HYALINE CASTS 0-5 (A) None /lpf    MUCOUS Rare (A) None /hpf   CREATINE KINASE (CK), TOTAL, SERUM   Result Value Ref Range    CREATINE KINASE 31 (L) 45 - 225 U/L       Imaging:   Peripheral Venous Duplex - Lower Interpreted by Dr. Lajean Saver  IMPRESSION:  1. There is no evidence  for deep venous thrombosis within the right lower extremity.     Results for orders placed or performed during the hospital encounter of 04/19/20 (from the past 72 hour(s))   XR HIP RIGHT W PELVIS 2-3 VIEWS     Status: None    Narrative    Male, 75 years old.    XR HIP RIGHT W PELVIS 2-3 VIEWS performed on 04/19/2020 10:16 AM.    REASON FOR EXAM:  Right hip/ groin pain    FINDINGS: 2 views right hip are submitted for interpretation.      Impression    1. Patient is osteopenic.  2. There are mild degenerative changes without displaced fracture or dislocation.  3. Visualized bowel pattern shows a moderate to large amount of stool.      Radiologist location ID: [id number]     CT ABDOMEN PELVIS W IV CONTRAST     Status: Abnormal    Narrative    Male, 75 years old.    CT ABDOMEN PELVIS W IV CONTRAST performed on 04/19/2020 12:46 PM.    REASON FOR EXAM:  Right groin pain    CT Dose:  1465 DLP (mGy*cm)  This CT scanner is equipped with dose reducing technology. The mAs is automatically adjusted to patient's body size in order to deliver the lowest dose possible.    CONTRAST: 100 ml's of Isovue 370    Findings: Axial postcontrast CT imaging of the abdomen and pelvis was performed along with sagittal and coronal reformats. No prior studies are available for direct comparison. The included lung bases show some mild consolidative atelectasis and likely trace pleural effusions.    There is a 5.5 x 6 x 5 cm multiloculated heterogeneous mass involving the tail the pancreas. There are further couple small cystic areas noted at the head of the pancreas. There is no evidence of surrounding inflammation or fluid. There is no peripancreatic adenopathy.    The liver shows a few simple appearing cysts. However there are also couple small hypodensities that are too small to further characterize. The gallbladder is mildly distended but no stone, sludge, wall thickening or surrounding inflammation is detected. There is no filling defect  within the common duct which is smoothly marginated.    The spleen, adrenal glands and kidneys show no acute process. Small cysts are seen at each kidney. The delayed sequence shows prompt excretion of contrast material bilaterally.    The small and large bowel show no obstructive process. There is a moderate amount of stool throughout the colon. No focal bowel wall thickening or surrounding inflammation is detected. There is no free fluid or air. Urinary bladder is distended symmetrically. Patient is status post prostatectomy. There is no free fluid or air. Patient does appear to have a right femoral hernia with fat extending along the common femoral vein and artery. There is a small fat-containing left inguinal hernia. Bony structures are intact.      Impression    1. 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 GI consult would be recommended for potential scope/ERCP and washings. No definite evidence to suggest distal metastasis is seen.  2. There are some hypodensities involving the liver with the larger likely representing cysts although there are scattered smaller areas that are too small to further characterize.  3. There is fatty density along the right femoral vascular structures likely representing a small femoral hernia. Patient also has a fat-containing left inguinal hernia which is small.  4. Cystic changes are noted at the kidneys.  5. Mild bibasilar atelectasis and cardiomegaly are noted.      Radiologist location ID: [id number]         Abnormal Lab results:  Labs Reviewed   COMPREHENSIVE METABOLIC PANEL, NON-FASTING - Abnormal; Notable for the following components:       Result Value    SODIUM 135 (*)     ESTIMATED GFR 57 (*)     ALBUMIN 2.9 (*)     GLUCOSE 134 (*)     All other components within normal  limits   URINALYSIS, MACROSCOPIC - Abnormal; Notable for the following components:    APPEARANCE Hazy (*)     PROTEIN 2+ (*)     LEUKOCYTES 1+ (*)     All other components within normal limits   CBC WITH DIFF - Abnormal; Notable for the following components:    WBC 12.4 (*)     NEUTROPHIL # 10.18 (*)  IMMATURE GRANULOCYTE # 0.17 (*)     All other components within normal limits   URINALYSIS, MICROSCOPIC - Abnormal; Notable for the following components:    WBCS 51-100 (*)     AMORPHOUS SEDIMENT Rare (*)     HYALINE CASTS 0-5 (*)     MUCOUS Rare (*)     All other components within normal limits   CREATINE KINASE (CK), TOTAL, SERUM - Abnormal; Notable for the following components:    CREATINE KINASE 31 (*)     All other components within normal limits   URINE CULTURE   CBC/DIFF    Narrative:     The following orders were created for panel order CBC/DIFF.  Procedure                               Abnormality         Status                     ---------                               -----------         ------                     CBC WITH EBRA[[id number]]                Abnormal            Final result                 Please view results for these tests on the individual orders.   COVID-19, FLU A/B, RSV RAPID BY PCR   PERFORM POC ISTAT CREATININE POINT OF CARE       Assessment & Plan     Plan: Appropriate labs and imaging ordered. Medical Records reviewed.    MDM:   During the patient's stay in the emergency department, the above listed information was used to assist with medical decision making and were reviewed by myself when available for review.  ED Course     ED Course as of 05/06/20 1012   Sun Apr 19, 2020   1340 Patient complains of having right groin/hip area pain that started a week ago.  He said he felt a pop at that time and says that the pain has been persistent and worsening.  X-ray shows no fracture, ultrasound of the leg shows no DVT, and patient has a +2 pulse.  His compartments are soft, but he does have  some edema of that leg.  CT scan of the abdomen and pelvis was obtained to ensure that there is no pelvic pathology/hernia that could be causing his discomfort but this was negative.  I have added a CPK and CT of the thigh and will be admitting patient for further evaluation and treatment as he is unable to walk due to this discomfort. [NC]   8811 Went to reassess patient, and while he initially denied any testicular pain, he tells me that he has started to have some testicular pain.  Chaperone, Lela, was at the bedside and we examined his testicles and he does have pain to palpation of his left testicle.  Ultrasound scrotum has been ordered. [NC]   0315 Patient is admitted, he was reassessed and is still having some pain.  His compartments remain  soft.  CT shows no vascular occlusion or abscess. [NC]      ED Course User Index  [NC] Conley Rolls, DO       Patient remained stable throughout the emergency department course.      Consults:    0964: Started secure chat   1408: Patient to be admitted to Dr. Kathreen Devoid    Disposition   Impression:   Diagnoses     Diagnosis Comment Added By Time Added    Leg pain  Conley Rolls, DO 04/19/2020 10:23 AM    Pancreatic mass  Conley Rolls, DO 04/19/2020  1:49 PM        Disposition:  Admitted     Patient will be admitted to Dr. Kathreen Devoid' service for further evaluation and management.     The patient verbalized understanding of all instructions and had no further questions or concerns.       I am scribing for, and in the presence of, Dr. Hoover Browns for services provided on 04/19/2020.  Karma Lew, SCRIBE     Karma Lew, [place]  04/19/2020, 09:25  I personally performed the services described in this documentation, as scribed  in my presence, and it is both accurate  and complete.    Conley Rolls, DO  Conley Rolls, DO  05/06/2020, 10:12

## 2020-04-19 NOTE — H&P (Signed)
[facility]  H&P    [name] 75 y.o. male A10/A10   Date of Service: 04/19/2020    Date of Admission:  04/19/2020   PCP: [place]       Chief Complaint:   Right groin pain   HPI:   [name] is a 75 y.o. male with PMH significant for hypertension, hyperlipidemia, hypothyroidism, HIV, and GERD who presented to the emergency department with right groin pain that has progressively worsened since Monday.  Patient states that last Saturday (8 days ago), he picked up a case of soda and tea and noticed that he pulled something in his right groin but he was able to continue with his ALDs without much troubles but on Monday after playing golf and helping his mother-in-law get in & out of her wheelchair, he started having more sever pain.  States that the pain initially improved with ice/heat/and Ben-Gay, but progressively worsened over the past week.  Patient states that he had difficulty sleeping due to the pain and was up at 3:00 a.m. and severe pain which prompted him to go to the emergency department.  Patient with significant right inguinal pain that is worse with palpation.  States that pain radiates to his right testicle, right lateral hip, anterior thigh, and medial calf.  States lifting his right leg up is extremely painful and he is having difficulty adjusting himself in bed due to the pain as well.     Pt has been going to a pain clinic associated with an outside facility [facility]), there he reports that he had been getting steroid injections and recently was referred to another pain manage,ent office for uncontrolled back pain.     Patient also reports night sweats and urinary urgency over the past 2-3 weeks.  Denies hematuria or suprapubic pain.  Denies subjective fever or chills, nausea, vomiting, or diarrhea. ROS also negative for chest pain or pressure, shortness of breath, or recent illness.    In ED, CT the abdomen pelvis  with IV contrast revealed fatty density along the right femoral vascular structures likely representing a small femoral hernia.  Also has a fat containing left inguinal hernia which is small.     Of note, CT also reveals pancreatic mass that was also identified on CTA of the chest 03/24/2020.  I spoke with the radiologist who compared today's images from images last admission and states there has been no change since February.  Patient was also admitted in February for chest pain. 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 Our images were reviewed with our radiologist in comparison to the report obtained from [facility] from 2017, and no intervention needed at this time.  A fine needle aspiration 2017 revealed benign etiology.  It is [facility] of this area        WBC 12.4, hemoglobin 15, CMP unremarkable, CK not elevated, UA positive for UTI    ROS: 12 point ROS was reviewed with patient and negative except for that mentioned in the HPI       ED medications:   Medications Administered in the ED   NS flush syringe (has no administration in time range)   NS flush syringe (has no administration in time range)   morphine 4 mg/mL injection (4 mg Intravenous Given 04/19/20 0937)  ondansetron (ZOFRAN) 2 mg/mL injection (4 mg Intravenous Given 04/19/20 0937)   NS bolus infusion 1,000 mL (0 mL Intravenous Stopped 04/19/20 1100)   morphine 4 mg/mL injection (4 mg Intravenous Given 04/19/20 1219)   cefTRIAXone (ROCEPHIN) 1g in NS 10mL minibag IVPB (0 g Intravenous Stopped 04/19/20 1336)   iopamidol (ISOVUE-370) 76% infusion (100 mL Intravenous Given 04/19/20 1247)   HYDROmorphone (DILAUDID) 0.5 mg/0.5 mL injection (has no administration in time range)         PMHx:    Past Medical History:   Diagnosis Date    Esophageal reflux     High cholesterol     HTN (hypertension)      Hypothyroidism         PSHx:   Past Surgical History:   Procedure Laterality Date    COLON SURGERY      HX HERNIA REPAIR      WRIST SURGERY Right           Allergies:    No Known Allergies Social History  Social History     Tobacco Use    Smoking status: Never Smoker    Smokeless tobacco: Never Used   Substance Use Topics    Alcohol use: Not Currently       Family History  Family Medical History:    None            Home Meds:      Prior to Admission medications    Medication Sig Start Date End Date Taking? Authorizing Provider   amlodipine besylate (AMLODIPINE ORAL) Take 5 mg by mouth Once a day    Provider, Historical   atorvastatin (LIPITOR) 20 mg Oral Tablet Every one hour    Provider, Historical   cetirizine (ALLERGY RELIEF, CETIRIZINE,) 10 mg Oral Tablet Take 10 mg by mouth Once a day    Provider, Historical   cholecalciferol, vitamin D3, 25 mcg (1,000 unit) Oral Tablet Take 1,000 Units by mouth Once a day    Provider, Historical   docusate sodium (COLACE) 100 mg Oral Capsule Take 100 mg by mouth Twice daily    Provider, Historical   elviteg-cob-emtri-tenof ALAFEN (GENVOYA) 150-150-200-10 mg Oral Tablet Every one hour    Provider, Historical   gabapentin (NEURONTIN) 300 mg Oral Capsule Take 1 Tab by mouth Three times a day    Provider, Historical   levothyroxine (SYNTHROID) 88 mcg Oral Tablet Take 88 mcg by mouth Every morning    Provider, Historical   melatonin 10 mg Oral Capsule Take by mouth    Provider, Historical   omega-3-DHA-EPA-fish oil (FISH OIL) 1,000 mg (120 mg-180 mg) Oral Capsule Take by mouth Once a day    Provider, Historical   omeprazole (PRILOSEC) 20 mg Oral Capsule, Delayed Release(E.C.) Take 20 mg by mouth Twice daily    Provider, Historical   levothyroxine (SYNTHROID) 75 mcg Oral Tablet levothyroxine   0.075mg   03/24/20  Provider, Historical          CBC Results Coag Results   Recent Labs     03/24/20  1051 03/25/20  0443 04/19/20  0933   WBC 14.5* 17.3* 12.4*   HGB 15.7 15.6 15.0   HCT  45.8 45.5 43.8   PLTCNT 210 199 207     Recent Labs     03/24/20  1051 03/25/20  0443 04/19/20  0933   RBC 4.99 4.99 4.70   HCT 45.8 45.5 43.8   HGB 15.7 15.6 15.0   WBC 14.5*  17.3* 12.4*   MCHC 34.3 34.3 34.2   MCH 31.5 31.3 31.9   MPV 10.6 11.1 9.5   MCV 91.8 91.2 93.2   PLTCNT 210 199 207    Recent Labs     03/24/20  1051   PROTHROMTME 12.1   INR 1.03     Recent Labs     03/24/20  1051   APTT 23.3*   INR 1.03   PROTHROMTME 12.1        BMP Results ABG Results   Recent Labs     03/24/20  1051 03/24/20  1322 03/25/20  0443 04/19/20  0933   SODIUM 133*  --  136 135*   POTASSIUM 4.8  --  4.8 4.5   CHLORIDE 103  --  107 97   CALCIUM 8.9  --  8.8 8.9   TOTALPROTEIN 6.4  --   --  7.2   MAGNESIUM  --  2.5 2.5  --    PHOSPHORUS  --   --  3.3  --    ALBUMIN 3.5  --   --  2.9*   CO2 22*  --  20* 25   BUN 43*  --  38* 18   CREATININE 1.62*  --  1.31 1.23   GLUCOSENF 134*  --  107 134*    Recent Labs     03/24/20  1051 03/25/20  0443 04/19/20  0933   POTASSIUM 4.8 4.8 4.5        Liver Test Results Cardiac Results   Recent Labs     03/24/20  1051 03/24/20  1651 04/19/20  0933   TOTBILIRUBIN 1.0  --  0.8   AST 22  --  13   ALT 32  --  34   ALKPHOS 88  --  100   LIPASE  --  85*  --     Recent Labs     03/24/20  1051 03/24/20  1540 04/19/20  1339   CPK  --   --  31*   TROPONINI <7* <7*  --         Pregnancy Test Results           Diagnostic studies:  TRANSTHORACIC ECHOCARDIOGRAM - ADULT    Result Date: 03/25/2020  **See full report in linked PDF document**                                                                                                                                                         Version: 1                                                             +--------------------------------------+                                                             :                                      :                                                             :                                      :                                                              +--------------------------------------+  [place] [address], [place], [place] [address]                                                                  Transthoracic Echocardiographic Report ______________________________________________________________________________ Name: LEHMAN, WHITELEY                        MRN: [id number]                                   Weight: 175.003 lb Study Date: 03/25/2020, 9: 14 AM                   DOB: 08-23-1945                                 Height: 70 in Gender: Male                                                                                       BSA: 1.97 m2 Patient Location: CCM ECHO CCM Referring Physician: Molly Maduro Ordering Physician: Molly Maduro Tech: RD ______________________________________________________________________________ Technical Quality:: Technically difficult study. Quality: Technically difficult study. Reason For Study: Chest pain Conclusions The left atrium is normal in size. Normal left ventricular size. Normal geometry. Left ventricular systolic function is normal. The left ventricular ejection fraction by visual assessment is estimated to be 60%. No segmental/regional wall motion abnormalities identified. Abnormal diastolic function, low filling pressure. No Aortic valve stenosis. Right ventricular systolic pressure is normal. Findings: Procedure: Transthoracic complete echo, 2D, spectral and tissue Doppler, color flow Doppler, M-mode. Left Ventricle: Normal left ventricular size. Normal geometry. Left ventricular systolic function is normal. The left ventricular ejection fraction by visual assessment is estimated to be 60%. No segmental/regional wall motion abnormalities identified. Abnormal diastolic function, low filling pressure. Right Ventricle: Normal right ventricular size. Normal right ventricular systolic function. Right  ventricular systolic pressure is normal. Left Atrium: The left atrium is normal in size. Right Atrium: The right atrium is of normal size. Mitral valve: The mitral valve is normal. No evidence of mitral stenosis. No significant mitral regurgitation present. Tricuspid valve: The tricuspid valve is normal. No significant tricuspid regurgitation present. There is no evidence of tricuspid stenosis. Aortic valve: The aortic valve is normal. Trileaflet aortic valve. No Aortic valve stenosis. No significant aortic regurgitation present. Pulmonic valve: The pulmonic valve is normal. Pulmonary Artery:: The pulmonary artery appears normal. Atrial Septum: The interatrial septum is normal in appearance. Aorta: The aortic root is of normal size. Pericardium: Normal pericardium with no pericardial effusion. 2D/ M Mode  Doppler LVIDd: 4.7 cm                             F: (3.8-5.2)/ M: (4.2-5.8)              AV Peak Vel: 121.2 cm/sec               (100-170 LVIDs: 3.0 cm                             F: (2.2-3.5/ M: 2.5-4.0)                                                       (70-90) IVSd: 1.10 cm                             F: (0.6-0.9)/ M: (0.6-1.0               AV max PG: 6.0 mmHg                    (2.0-9.0)                                                                                     AV Mean PG: 3.6 mmHg                   (2.0-4.0 LVPWd: 0.96 cm                           F: (0.6-0.9)/ M: (0.6-1.0)              AVA Vmax): 3.1 cm2 LVPWs: 1.17 cm                                                                   AVA VTI: 3.0 cm2                                                                                     AV DI (VTI): 0.88  AV DI (vel): 0.92 LV Mass: 170.5 grams                      F: (67-162)/ M: (88-224) LV Mass Index: 86.4 grams/m2              F: (43-95)/ M:  (49-115)                 LVOT diam: 2.08 cm RWT: 0.41                                                                        LVOT Peak Vel: 111.6 cm/sec LA dimension: 3.1 cm                      F: (2.7-3.8)/ M: (3.0-4.0)                                             F: (1.5-2.3)/ M: (1.5-2.3)              LVOT Peak PG: 5.0 mmHg                                             F: (8-24)/ M: (11-31)                   LVOT Mean PG: 2.6 mmHg                                             F: (1.5-2.3)/ M: (1.5-2.3)              LVOT VTI: 21.8 cm                                             F: (8-24)/ M: (11-31)                                             F: (15-27)/ M: (18-32)                  AV VTI: 24.7 cm                                             F: (8-20)/ M: (10-24)                                             F: (4.5-11)/ M: (5-12.6)  F: (1.6-6.4)/ M: (2.0-7.4)                                             42-56                                   SV(LVOT): 74.0 ml                                             F: (32-74)/ M: (36-87)                   SI(LVOT): 37.5 ml/m2                                             F: (8-36)/ M: (10-44)                                             20.5-27.5                                             F: (46-106)/ M: (62-150)                                             F: (14-42)/ M: (21-61)                  MV E Peak Vel: 61.7 cm/sec                                                                                     MV A Peak Vel: 80.9 cm/sec EDV (MOD-bp): 63.0 ml ESV (MOD-bp): 26.0 ml EF (MOD-bp): 59.0 %                      F: (54-74)/ M: (52-72) EDV(MOD-sp4): 73.0 ml                                                            Lat Peak E' Vel: 4.8 cm/sec EDV(MOD-sp2): 52.0 ml  Lat E/E': 12.9 ESV(MOD-sp2): 20.0 ml                                                            Med Peak E' Vel: 4.6  cm/sec ESV(MOD-sp4): 32.0 ml                                                            Med E/E': 13.5 LV FS: 35.8 % AoR Diam: 3.3 cm                         F:: (2.7-3.3)/ M: (3.1-3.7)                                             F: (2.3-2.9)/ M: (2.6-3.2)                                             F: (2.3-2.9)/ M: (2.6-3.2)                                                                                     MV dec slope: 218.5 cm/sec2                                                                                     TR Vmax: 238.9 cm/sec                                                                                     TR Peak PG: 23.0 mmHg  RAP systole: 3.0 mmHg                                                                                     RVSP: 26.0 mmHg ______________________________________________________________________________ Electronically signed by: MD Linus Mako GODDARD   03/25/2020, 6: 70 PM     CT BRAIN WO IV CONTRAST    Result Date: 03/24/2020  Exam:CT of the head without contrast: Clinical history:Dizziness and chest pain Total DLP1307mG y*cm This CT scanner is equipped with dose reducing technology which automatically reduces the MAS according to patient size to achieve the lowest radiation exposure possible. Axial noncontrast 5 mm images were obtained through the brain and viewed with brain and bone windows. In addition, sagittal and coronal reconstructions were completed. There is no evidence of intracranial hemorrhage, midline shift, hydrocephalus, or acute ischemia in a major vascular territory. There is prominence of the subarachnoid spaces and ventricular system consistent with diffuse parenchymal volume loss compatible with the patient's age. Bone windows demonstrate no definite calvarial fracture.     1. Age-appropriate central and cortical atrophy. 2. No acute intracranial process. 3. There is no calvarial  fracture. 4. Moderate dural calcification is noted along the falx debris. 5. There is a large mucous retention cyst inferiorly in the right maxillary sinus. Radiologist location ID: [id number]     CT ANGIO CHEST W IV CONTRAST    Result Date: 03/24/2020  Male, 75 years old. CT ANGIO CHEST W IV CONTRAST performed on 03/24/2020 3:29 PM. REASON FOR EXAM:  CP radiating to back RADIATION DOSE: 441 DLP CONTRAST: 56 ml's of Isovue 370 TECHNIQUE: Enhanced 3 mm transaxial and sagittal coronal reconstruction imaging performed through the chest. 3-D angiography was performed. The CT scanner is equipped with dose reducing technology. The MAS is automatically adjusted to the patient body size in order to deliver the lowest dose possible. COMPARISON: None FINDINGS: There is contrast enhancement in the main pulmonary and right and left pulmonary arteries and no filling defects are identified. No mediastinal or hilar adenopathy is seen. The lung window images show the lungs to be expanded. Subpleural interstitial changes are noted in both lower lobes likely associated with dependent atelectasis. No pleural effusion seen. Degenerative changes are noted in the thoracic spine. Very mild superior endplate couple compression deformity is noted in one midthoracic vertebral bodies likely old. The thoracic aorta appears normal. No findings to indicate dissection is seen on the CT angiographic chest. Images of the upper abdomen show the visualized portion of the liver and spleen appear unremarkable. There is a hypoattenuating structure in the left lobe of the liver. Adrenal glands appear unremarkable. In the body of the pancreas there is a 6.8 cm x 4.3 cm complex cystic mass. This is not fully evaluated. CT scan of the abdomen and pelvis as a follow-up is recommended. There is evidence for anterior abdominal wall hernia repair with mesh incompletely evaluated.     1. No CT evidence of pulmonary emboli detected. 2. No acute intrathoracic process  is seen. 3. 6.8 x 4.3 cm complex cystic mass is noted in the body of the pancreas. Question pancreatic neoplasm. The findings were called to the emergency Department.  Radiologist location ID: [id number]     CT ABDOMEN PELVIS W IV CONTRAST    Result Date: 04/19/2020  Male, 75 years old. CT ABDOMEN PELVIS W IV CONTRAST performed on 04/19/2020 12:46 PM. REASON FOR EXAM:  Right groin pain CT Dose:  1465 DLP (mGy*cm) This CT scanner is equipped with dose reducing technology. The mAs is automatically adjusted to patient's body size in order to deliver the lowest dose possible. CONTRAST: 100 ml's of Isovue 370 Findings: Axial postcontrast CT imaging of the abdomen and pelvis was performed along with sagittal and coronal reformats. No prior studies are available for direct comparison. The included lung bases show some mild consolidative atelectasis and likely trace pleural effusions. There is a 5.5 x 6 x 5 cm multiloculated heterogeneous mass involving the tail the pancreas. There are further couple small cystic areas noted at the head of the pancreas. There is no evidence of surrounding inflammation or fluid. There is no peripancreatic adenopathy. The liver shows a few simple appearing cysts. However there are also couple small hypodensities that are too small to further characterize. The gallbladder is mildly distended but no stone, sludge, wall thickening or surrounding inflammation is detected. There is no filling defect within the common duct which is smoothly marginated. The spleen, adrenal glands and kidneys show no acute process. Small cysts are seen at each kidney. The delayed sequence shows prompt excretion of contrast material bilaterally. The small and large bowel show no obstructive process. There is a moderate amount of stool throughout the colon. No focal bowel wall thickening or surrounding inflammation is detected. There is no free fluid or air. Urinary bladder is distended symmetrically. Patient is status  post prostatectomy. There is no free fluid or air. Patient does appear to have a right femoral hernia with fat extending along the common femoral vein and artery. There is a small fat-containing left inguinal hernia. Bony structures are intact.     1. 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 GI consult would be recommended for potential scope/ERCP and washings. No definite evidence to suggest distal metastasis is seen. 2. There are some hypodensities involving the liver with the larger likely representing cysts although there are scattered smaller areas that are too small to further characterize. 3. There is fatty density along the right femoral vascular structures likely representing a small femoral hernia. Patient also has a fat-containing left inguinal hernia which is small. 4. Cystic changes are noted at the kidneys. 5. Mild bibasilar atelectasis and cardiomegaly are noted. Radiologist location ID: [id number]     XR AP MOBILE CHEST    Result Date: 03/24/2020  Portable chest x-ray: CLINICAL HISTORY: Severe left chest pain with shortness of breath. A single frontal portable view of the chest was obtained. The heart size is normal. There is minimal left basilar atelectasis. Otherwise the lungs are clear. There is no pneumothorax or pleural effusion.     1. Minimal left basilar atelectasis but no acute cardiopulmonary disease. Radiologist location ID: [id number]     PERIPHERAL VENOUS DUPLEX - LOWER    Result Date: 04/19/2020  EXAM: PERIPHERAL VENOUS DUPLEX - LOWER HISTORY: M79.606: Leg pain Spectral analysis of, grayscale, and color flow imaging of the venous system of the right lower extremity is performed. Normal flow is seen within the common femoral, superficial femoral, and popliteal veins. These  show good augmentation  and compressibility. There is no evidence for deep venous thrombosis. The upper calf veins, if visualized, appear normal.     1. There is no evidence for deep venous thrombosis within the right lower extremity. Radiologist location ID: [id number]     MYOCARDIAL PERFUSION COMPLETE    Result Date: 03/25/2020    Normal myocardial perfusion study.   Post-stress ejection fraction was 78 %. 1. EKG and symptom negative exercise stress test 2. Average exercise capacity 3. Perfusion imaging interpretation rendered by Radiology: no ischemia, EF 78%                                              Paulita Cradle, MD No induced ischemia. Slight quantitative TID     XR HIP RIGHT W PELVIS 2-3 VIEWS    Result Date: 04/19/2020  Male, 75 years old. XR HIP RIGHT W PELVIS 2-3 VIEWS performed on 04/19/2020 10:16 AM. REASON FOR EXAM:  Right hip/ groin pain FINDINGS: 2 views right hip are submitted for interpretation.     1. Patient is osteopenic. 2. There are mild degenerative changes without displaced fracture or dislocation. 3. Visualized bowel pattern shows a moderate to large amount of stool. Radiologist location ID: [id number]     ECG 12 LEAD- TIME WITH TROPONINS    Result Date: 03/24/2020  Normal sinus rhythm Normal ECG  REVIEWED BY DR. Maida Sale Confirmed by Maida Sale ([id number]), editor Rosalia, MEGAN [phone]) on 03/24/2020 4:32:22 PM    ECG 12 LEAD- TIME WITH TROPONINS    Result Date: 03/24/2020  Normal sinus rhythm Normal ECG  REVIEWED BY DR. Maida Sale Confirmed by Maida Sale ([id number]), editor Cleary, MEGAN [phone]) on 03/24/2020 1:39:44 PM    ECG 12 LEAD- TIME WITH TROPONINS    Result Date: 03/24/2020  Normal sinus rhythm Normal ECG  REVIEWED BY DR. Maida Sale Confirmed by Maida Sale ([id number]), editor Irving Copas, MEGAN [phone]) on 03/24/2020 12:14:11 PM    EKG interpretation: na    PHYSICAL EXAM:  VITALS: BP 128/73    Pulse 95    Temp 37.8 C (100.1 F)    Resp 16    SpO2 95%     GENERAL:   Pt is a pleasant,  well-nourished, well-developed 75 y.o. male who is resting comfortably in bed in NAD. Appears stated age  38:  head normocephalic, symmetrical facies. Sclera non-icteric, non-injected. Oropharyngeal mucous membranes are moist w/o erythema/exudates   CV: RRR. No murmurs.  Right lower extremity with trace to 1+ edema.  Left lower extremity with no edema.  2+ pulses present b/l.   LUNGS: CTAB, No rhonchi, rales, wheezes.   GI: (+) BS in all 4 quadrants. soft, NT/ ND. No rigidity/ guarding/ rebound.  GU: deferred  MSK: full spontaneous ROM of all 4 extremities. Gait not assessed at this time.  Significant pain to palpation and at rest to right groin area, right scrotum, and right thigh.  SKIN: No significant lesions, rashes, ecchymoses noted.   NEURO: AAOx4, CN grossly intact. Sensation intact b/l. No focal deficits.  PSYCH: Mood, behavior and affect normal w/intact judgement & insight       Assessment/Plan:  1. Right groin pain:  Unclear etiology at this time.  CT the abdomen revealed a small hernia that appeared not acute.  Right scrotal ultrasound WNL.  Venous duplex negative for DVT.  CT of the femur/thigh with IV contrast has been ordered.  Have ordered Norco and Dilaudid p.r.n. for pain.  2. UTI:  Have initiated Rocephin.  Urine culture pending.  3. Essential hypertension:  Blood pressures are stable.  Continue patient's home dose of Norvasc  4. Hyperlipidemia:  Continue statin  5. Hypothyroidism:  Continue Synthroid  6. HIV positive:  Continue patient's home antiviral medications.  7. History of prostate cancer:  Follows with the [place].  8. 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  [facility].      DVT prophylaxis: Lovenox    All critical aspects of this H&P and assessment/plan were discussed in detail with Dr. Kathreen Devoid and she does agree with the stated above.     Leslie Andrea APRN-CNP      ATTENDING ATTESTATION:  I have seen &  evaluated the patient with the PA/resident. I agree with their findings & have made changes as underlined above.    Tia Alert, MD

## 2020-04-19 NOTE — Nurses Notes (Signed)
Patient resting in bed at time of report. Patient does have s/s of anxiety related to his pain. Patient complaining of pain to RLE. Patient was medicated prior to admission to floor. Educated on pain relief techniques and to help him slow his breathing. Patient has PO fluids and personals in reach, bed is in lowest position and wheels locked. Call light at patients side.   Lovie Macadamia, RN  04/19/2020, [phone]

## 2020-04-19 NOTE — ED Triage Notes (Addendum)
Pt reports he was carrying cases of pop upstairs approx one week ago. Pt states he felt a "pop" at that time. Pt then went golfing on Tuesday and Wednesday and that he began having pain and swelling in the right leg on Thursday.

## 2020-04-19 NOTE — Nurses Notes (Signed)
Pt arrived to floor. Spouse at bedside. Oriented to room and call light. Admission and assessment to follow. Laurel Dimmer, RN  04/19/2020, 18:09

## 2020-04-20 ENCOUNTER — Observation Stay (HOSPITAL_COMMUNITY): Payer: 59

## 2020-04-20 LAB — CBC WITH DIFF
BASOPHIL #: <0.1 10*3/uL (ref <=0.20)
BASOPHIL %: 0 %
EOSINOPHIL #: <0.1 10*3/uL (ref <=0.50)
EOSINOPHIL %: 0 %
HCT: 36.6 % — ABNORMAL LOW (ref 38.9–52.0)
HGB: 12.6 g/dL — ABNORMAL LOW (ref 13.4–17.5)
IMMATURE GRANULOCYTE #: 0.14 10*3/uL — ABNORMAL HIGH (ref <0.10)
IMMATURE GRANULOCYTE %: 1 % (ref 0–1)
LYMPHOCYTE #: 1.1 10*3/uL (ref 1.00–4.80)
LYMPHOCYTE %: 9 %
MCH: 32.8 pg — ABNORMAL HIGH (ref 26.0–32.0)
MCHC: 34.4 g/dL (ref 31.0–35.5)
MCV: 95.3 fL (ref 78.0–100.0)
MONOCYTE #: 0.9 10*3/uL (ref 0.20–1.10)
MONOCYTE %: 8 %
MPV: 10.6 fL (ref 8.7–12.5)
NEUTROPHIL #: 9.84 10*3/uL — ABNORMAL HIGH (ref 1.50–7.70)
NEUTROPHIL %: 82 %
PLATELETS: 162 10*3/uL (ref 150–400)
RBC: 3.84 10*6/uL — ABNORMAL LOW (ref 4.50–6.10)
RDW-CV: 14.2 % (ref 11.5–15.5)
WBC: 12 10*3/uL — ABNORMAL HIGH (ref 3.7–11.0)

## 2020-04-20 LAB — BASIC METABOLIC PANEL
ANION GAP: 11 mmol/L (ref 4–13)
BUN/CREA RATIO: 21 (ref 6–22)
BUN: 23 mg/dL (ref 8–25)
CALCIUM: 8.2 mg/dL — ABNORMAL LOW (ref 8.8–10.2)
CHLORIDE: 102 mmol/L (ref 96–111)
CO2 TOTAL: 21 mmol/L — ABNORMAL LOW (ref 23–31)
CREATININE: 1.1 mg/dL (ref 0.75–1.35)
ESTIMATED GFR: 66 mL/min/BSA (ref >=60)
GLUCOSE: 135 mg/dL — ABNORMAL HIGH (ref 65–125)
POTASSIUM: 4.7 mmol/L (ref 3.5–5.1)
SODIUM: 134 mmol/L — ABNORMAL LOW (ref 136–145)

## 2020-04-20 LAB — MAGNESIUM: MAGNESIUM: 2.4 mg/dL (ref 1.8–2.6)

## 2020-04-20 MED ORDER — SULFAMETHOXAZOLE 800 MG-TRIMETHOPRIM 160 MG TABLET
1.0000 | ORAL_TABLET | Freq: Two times a day (BID) | ORAL | Status: DC
Start: 2020-04-20 — End: 2020-04-22
  Administered 2020-04-20 – 2020-04-22 (×5): 160 mg via ORAL
  Filled 2020-04-20 (×5): qty 1

## 2020-04-20 MED ORDER — MORPHINE 4 MG/ML INTRAVENOUS SOLUTION
4.0000 mg | INTRAVENOUS | Status: DC | PRN
Start: 2020-04-20 — End: 2020-04-28
  Administered 2020-04-20 – 2020-04-25 (×18): 4 mg via INTRAVENOUS
  Filled 2020-04-20 (×18): qty 1

## 2020-04-20 MED ORDER — GADOBENATE DIMEGLUMINE 529 MG/ML(0.1 MMOL/0.2 ML) INTRAVENOUS SOLUTION
13.0000 mL | INTRAVENOUS | Status: AC
Start: 2020-04-20 — End: 2020-04-20
  Administered 2020-04-20: 13 mL via INTRAVENOUS

## 2020-04-20 MED ORDER — ELVITEG 150 MG-COB 150 MG-EMTRICIT 200 MG-TENOFO ALAFENAM 10 MG TABLET
1.0000 | ORAL_TABLET | Freq: Every day | ORAL | Status: DC
Start: 2020-04-20 — End: 2020-04-28
  Administered 2020-04-20 – 2020-04-26 (×7): 1 via ORAL
  Administered 2020-04-27 – 2020-04-28 (×2): 0 via ORAL

## 2020-04-20 NOTE — Care Plan (Signed)
Patient still having a lot of pain. Morphine and Norco in use for pain control. MRI finished, new consults Dr. Girtha Rm, Dr. Lysle Rubens, and Dr. Parke Simmers. Alfonse Spruce, RN  04/20/2020, 20:03       Problem: Adult Inpatient Plan of Care  Goal: Plan of Care Review  Outcome: Ongoing (see interventions/notes)  Goal: Patient-Specific Goal (Individualized)  Outcome: Ongoing (see interventions/notes)  Flowsheets (Taken 04/20/2020 2000)  Individualized Care Needs: pain control  Goal: Absence of Hospital-Acquired Illness or Injury  Outcome: Ongoing (see interventions/notes)  Goal: Optimal Comfort and Wellbeing  Outcome: Ongoing (see interventions/notes)  Goal: Rounds/Family Conference  Outcome: Ongoing (see interventions/notes)     Problem: Fall Injury Risk  Goal: Absence of Fall and Fall-Related Injury  Outcome: Ongoing (see interventions/notes)     Problem: Pain Acute  Goal: Acceptable Pain Control and Functional Ability  Outcome: Ongoing (see interventions/notes)     Problem: Fall Injury Risk  Goal: Absence of Fall and Fall-Related Injury  Outcome: Ongoing (see interventions/notes)

## 2020-04-20 NOTE — Consults (Signed)
Patient is a 75 year old gentleman severe pain emanating from the right anterior thigh traveling down the anterior leg to the dorsum of the foot.  The pain has been getting steadily worse over the past few days and was associated with right leg swelling which is now resolved.  There is also marked swelling about the right groin which is also evidently improved.    On examination the patient has exquisite tenderness with straight leg raising and also external rotation of the right hip.  Straight leg raise is the most provocative.  He has 5/5 strength in plantar flexion on the right 5-out of 5 strength in dorsiflexion on the right extensor hallucis longus is 4+ out of 5.  Knee extension knee flexion hip extension and hip flexion are all markedly weak which may be secondary to pain or true weakness.    MRI scan demonstrates a right paracentral disc at L1-L2 not causing significant neural impingement.  There is mild left lateral recess stenosis at L4-5 and L5-S1.  There is no significant right-sided neural impingement at L4-5 or L5-S1 to explain his symptoms.    While the patient's symptoms are consistent clinically with the L5 radiculopathy there is no corresponding abnormality on the MRI scan to account for his symptoms.  I would suggest Neurology assess the patient and if they do not have a clear etiology for the this man's pain an MRI scan of the she is lumbar plexus and right thigh will be ordered for tomorrow.

## 2020-04-20 NOTE — Care Plan (Signed)
Patient resting in bed. Patient has tolerated pain better this AM. Patient has been able to tolerate pain better since last dose. Patient has no incontinence issues this shift. PO fluids at bedside and call light within reach. Bed is locked and in lowest position.   Lovie Macadamia, RN  04/20/2020, 03:15    Problem: Adult Inpatient Plan of Care  Goal: Plan of Care Review  Outcome: Ongoing (see interventions/notes)  Flowsheets (Taken 04/20/2020 0311)  Plan of Care Reviewed With: patient  Progress: no change  Goal: Patient-Specific Goal (Individualized)  Outcome: Ongoing (see interventions/notes)  Goal: Absence of Hospital-Acquired Illness or Injury  Outcome: Ongoing (see interventions/notes)  Goal: Optimal Comfort and Wellbeing  Outcome: Ongoing (see interventions/notes)  Goal: Rounds/Family Conference  Outcome: Ongoing (see interventions/notes)     Problem: Fall Injury Risk  Goal: Absence of Fall and Fall-Related Injury  Outcome: Ongoing (see interventions/notes)     Problem: Pain Acute  Goal: Acceptable Pain Control and Functional Ability  Outcome: Ongoing (see interventions/notes)     Problem: Fall Injury Risk  Goal: Absence of Fall and Fall-Related Injury  Outcome: Ongoing (see interventions/notes)  Note: Alarms activated

## 2020-04-20 NOTE — Care Management Notes (Signed)
[facility] Management Initial Evaluation    Patient Name: [name]  Date of Birth: 1945-09-15  Sex: male  Date/Time of Admission: 04/19/2020  9:09 AM  Room/Bed: 415/A  [facility]or: [facility] / Plan: [place] [employer] / Product Type: Managed Care /   Primary Care Providers:  [place] (General)    Pharmacy Info:   Preferred Pharmacy       [id number] [address] [facility] [address]    [address] [place] [address]    Phone: [phone] Fax: [phone]    Hours: Not open 24 hours          Emergency Contact Info:   Extended Emergency Contact Information  Primary Emergency Contact: Melissa Phone: [phone]  Relation: Wife  Preferred language: English  Interpreter needed? No    History:   [name] is a 75 y.o., male, admitted observation    Height/Weight: 177.8 cm (5\' 10" ) / 79.4 kg (175 lb)     LOS: 0 days   Admitting Diagnosis: Intractable pain [R52]    Assessment:      04/20/20 1051   Assessment Details   Assessment Type Admission   Living Environment   Lives With spouse   Living Arrangements house   Care Management Plan   Discharge Planning Status initial meeting   Discharge plan discussed with: Patient;Spouse   Discharge Needs Assessment   Equipment Currently Used at Home none   Equipment Needed After Discharge none   Discharge Facility/Level of Care Needs Home (Patient/Family Member/other)(code 1)   Transportation Available family or friend will provide   ADVANCE DIRECTIVES   Does the Patient have an Advance Directive? Yes, Patient Does Have Advance Directive for Healthcare Treatment   Document the Substance of the Advance Directive (Required) pt states is on file here.       Discharge Plan:  Home (Patient/Family Member/other) (code 1)  Pt states lives with wife in a one story house with a ramp to enter, pt states is independent with all adl's and has no DME or other services. D/c plan home with wife    The  patient will continue to be evaluated for developing discharge needs.     Case Manager: Merita Norton, CASE MANAGER  Phone:

## 2020-04-20 NOTE — Progress Notes (Signed)
[name]  HOSPITALIST PROGRESS NOTE      [name]                     75 y.o. male 415/A    Date of service: 04/20/2020      Date of Admission:  04/19/2020   Code Status: Full Code Observation     HPI:    [name] is a 75 y.o. male with PMH significant for hypertension, hyperlipidemia, hypothyroidism, HIV, and GERD who presented to the emergency department with right groin pain that has progressively worsened since Monday.  Patient states that last Saturday (8 days ago), he picked up a case of soda and tea and noticed that he pulled something in his right groin but he was able to continue with his ALDs without much troubles but on Monday after playing golf and helping his mother-in-law get in & out of her wheelchair, he started having more sever pain.  States that the pain initially improved with ice/heat/and Ben-Gay, but progressively worsened over the past week.  Patient states that he had difficulty sleeping due to the pain and was up at 3:00 a.m. and severe pain which prompted him to go to the emergency department.  Patient with significant right inguinal pain that is worse with palpation.  States that pain radiates to his right testicle, right lateral hip, anterior thigh, and medial calf.  States lifting his right leg up is extremely painful and he is having difficulty adjusting himself in bed due to the pain as well.     Pt has been going to a pain clinic associated with an outside facility [name]), there he reports that he had been getting steroid injections and recently was referred to another pain manage,ent office for uncontrolled back pain.     Patient also reports night sweats and urinary urgency over the past 2-3 weeks.  Denies hematuria or suprapubic pain.  Denies subjective fever or chills, nausea, vomiting, or diarrhea. ROS also negative for chest pain or pressure, shortness of breath, or recent illness.    In ED, CT the abdomen pelvis  with IV contrast revealed fatty density along the right femoral vascular structures likely representing a small femoral hernia.  Also has a fat containing left inguinal hernia which is small.     Of note, CT also reveals pancreatic mass that was also identified on CTA of the chest 03/24/2020.  I spoke with the radiologist who compared today's images from images last admission and states there has been no change since February.  Patient was also admitted in February for chest pain. 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 Our images were reviewed with our radiologist in comparison to the report obtained from [facility] from 2017, and no intervention needed at this time.  A fine needle aspiration 2017 revealed benign etiology.  It is recommended [facility] ensure [facility] of this area        WBC 12.4, hemoglobin 15, CMP unremarkable, CK not elevated, UA positive for UTI        Subjective / Interval history:  Patient's symptoms are clinically is consistent with right radiculopathy.  MRI of the lumbar spine was performed and Neurosurgery was consulted.  However as per Neurosurgery patient's symptoms are not related to radiculopathy.  I will obtain consultation with Orthopedic to see  if they can figure out where the pain is coming from.  I will discontinue Dilaudid and start morphine p.r.n. for pain will continue Norco as before.     DENIES: chest pain, SOB, cough/sputum, fever/chills, abdo pain, nausea/ vomiting/ diarrhea/ constipation.    PHYSICAL EXAM:  VITALS: BP 130/78   Pulse 81   Temp 36.6 C (97.9 F)   Resp 18   Ht 1.778 m (5\' 10" )   Wt 79.4 kg (175 lb)   SpO2 97%   BMI 25.11 kg/m         GENERAL:   Pt is a pleasant, well-nourished, well-developed 75 y.o. male who is resting comfortably in bed in NAD. Appears stated age  85:  head normocephalic, symmetrical facies. EOM intact  b/l. Sclera non-icteric, non-injected. Oropharyngeal mucous membranes are moist w/o erythema/exudates   CV: RRR. No murmurs. No BLE edema. 2+ pulses present b/l.   LUNGS: CTAB, No rhonchi, rales, wheezes.   BREAST: deferred   RECTAL: deferred  GI: (+) BS in all 4 quadrants. soft, NT/ ND. No rigidity/ guarding/ rebound.  GU: deferred  MSK: full spontaneous ROM of all 4 extremities. Gait not assessed at this time.  SKIN: No significant lesions, rashes, ecchymoses noted.   NEURO: AAOx4, CN grossly intact. Sensation intact b/l. No focal deficits.  PSYCH: Mood, behavior and affect normal w/ Intact judgement & insight         Assessment/Plan:  1. Right groin pain:  Unclear etiology at this time.  CT the abdomen revealed a small hernia that appeared not acute.  Right scrotal ultrasound WNL.  Venous duplex negative for DVT.  CT of the femur/thigh with IV contrast also nonrevealing.  2. As mentioned above MRI of the lumbar spine was also performed neurosurgery was also consulted.  Patient pain is not from radiculopathy as per Neurosurgery.  Orthopedic will be consulted.  Will continue Norco as before I will discontinue Dilaudid and start morphine p.r.n. for pain  3.   4. UTI:  Have initiated Rocephin.  Urine culture pending.  5. Essential hypertension:  Blood pressures are stable.  Continue patient's home dose of Norvasc  6. Hyperlipidemia:  Continue statin  7. Hypothyroidism:  Continue Synthroid  8. HIV positive:  Continue patient's home antiviral medications.  9. History of prostate cancer:  Follows with the [facility] [place].  10. 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  [facility].      DVT prophylaxis: Lovenox            ANCILLARY DATA:  Results for orders placed or performed during the [name] encounter of 04/19/20 (from the past 24 hour(s))   CBC/DIFF    Narrative    The following orders were created for panel order CBC/DIFF.  Procedure                                Abnormality         Status                     ---------                               -----------         ------  CBC WITH NIDP[[id number]]                Abnormal            Final result                 Please view results for these tests on the individual orders.   BASIC METABOLIC PANEL, NON-FASTING   Result Value Ref Range    SODIUM 134 (L) 136 - 145 mmol/L    POTASSIUM 4.7 3.5 - 5.1 mmol/L    CHLORIDE 102 96 - 111 mmol/L    CO2 TOTAL 21 (L) 23 - 31 mmol/L    ANION GAP 11 4 - 13 mmol/L    CALCIUM 8.2 (L) 8.8 - 10.2 mg/dL    GLUCOSE 135 (H) 65 - 125 mg/dL    BUN 23 8 - 25 mg/dL    CREATININE 1.10 0.75 - 1.35 mg/dL    BUN/CREA RATIO 21 6 - 22    ESTIMATED GFR 66 >=60 mL/min/BSA   MAGNESIUM   Result Value Ref Range    MAGNESIUM 2.4 1.8 - 2.6 mg/dL   CBC WITH DIFF   Result Value Ref Range    WBC 12.0 (H) 3.7 - 11.0 x10^3/uL    RBC 3.84 (L) 4.50 - 6.10 x10^6/uL    HGB 12.6 (L) 13.4 - 17.5 g/dL    HCT 36.6 (L) 38.9 - 52.0 %    MCV 95.3 78.0 - 100.0 fL    MCH 32.8 (H) 26.0 - 32.0 pg    MCHC 34.4 31.0 - 35.5 g/dL    RDW-CV 14.2 11.5 - 15.5 %    PLATELETS 162 150 - 400 x10^3/uL    MPV 10.6 8.7 - 12.5 fL    NEUTROPHIL % 82 %    LYMPHOCYTE % 9 %    MONOCYTE % 8 %    EOSINOPHIL % 0 %    BASOPHIL % 0 %    NEUTROPHIL # 9.84 (H) 1.50 - 7.70 x10^3/uL    LYMPHOCYTE # 1.10 1.00 - 4.80 x10^3/uL    MONOCYTE # 0.90 0.20 - 1.10 x10^3/uL    EOSINOPHIL # <0.10 <=0.50 x10^3/uL    BASOPHIL # <0.10 <=0.20 x10^3/uL    IMMATURE GRANULOCYTE % 1 0 - 1 %    IMMATURE GRANULOCYTE # 0.14 (H) <0.10 x10^3/uL        Diagnostic studies:  Results for orders placed or performed during the [name] encounter of 04/19/20 (from the past 72 hour(s))   XR HIP RIGHT W PELVIS 2-3 VIEWS     Status: None    Narrative    Male, 75 years old.    XR HIP RIGHT W PELVIS 2-3 VIEWS performed on 04/19/2020 10:16 AM.    REASON FOR EXAM:  Right hip/ groin pain    FINDINGS: 2 views right hip are submitted for interpretation.       Impression    1. Patient is osteopenic.  2. There are mild degenerative changes without displaced fracture or dislocation.  3. Visualized bowel pattern shows a moderate to large amount of stool.      Radiologist location ID: [id number]     CT ABDOMEN PELVIS W IV CONTRAST     Status: Abnormal    Narrative    Male, 75 years old.    CT ABDOMEN PELVIS W IV CONTRAST performed on 04/19/2020 12:46 PM.    REASON FOR EXAM:  Right groin pain    CT Dose:  1465 DLP (mGy*cm)  This CT scanner is equipped with dose reducing technology. The mAs is automatically adjusted to patient's body size in order to deliver the lowest dose possible.    CONTRAST: 100 ml's of Isovue 370    Findings: Axial postcontrast CT imaging of the abdomen and pelvis was performed along with sagittal and coronal reformats. No prior studies are available for direct comparison. The included lung bases show some mild consolidative atelectasis and likely trace pleural effusions.    There is a 5.5 x 6 x 5 cm multiloculated heterogeneous mass involving the tail the pancreas. There are further couple small cystic areas noted at the head of the pancreas. There is no evidence of surrounding inflammation or fluid. There is no peripancreatic adenopathy.    The liver shows a few simple appearing cysts. However there are also couple small hypodensities that are too small to further characterize. The gallbladder is mildly distended but no stone, sludge, wall thickening or surrounding inflammation is detected. There is no filling defect within the common duct which is smoothly marginated.    The spleen, adrenal glands and kidneys show no acute process. Small cysts are seen at each kidney. The delayed sequence shows prompt excretion of contrast material bilaterally.    The small and large bowel show no obstructive process. There is a moderate amount of stool throughout the colon. No focal bowel wall thickening or surrounding inflammation is detected. There is no free fluid or  air. Urinary bladder is distended symmetrically. Patient is status post prostatectomy. There is no free fluid or air. Patient does appear to have a right femoral hernia with fat extending along the common femoral vein and artery. There is a small fat-containing left inguinal hernia. Bony structures are intact.      Impression    1. 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 GI consult would be recommended for potential scope/ERCP and washings. No definite evidence to suggest distal metastasis is seen.  2. There are some hypodensities involving the liver with the larger likely representing cysts although there are scattered smaller areas that are too small to further characterize.  3. There is fatty density along the right femoral vascular structures likely representing a small femoral hernia. Patient also has a fat-containing left inguinal hernia which is small.  4. Cystic changes are noted at the kidneys.  5. Mild bibasilar atelectasis and cardiomegaly are noted.      Radiologist location ID: [id number]     US SCROTUM     Status: None    Narrative    Male, 75 years old.    US SCROTUM performed on 04/19/2020 3:03 PM.    REASON FOR EXAM:  Right testicular pain    FINDINGS: Ultrasound evaluation of the scrotum/testicles was performed using grayscale and color Doppler imaging the bilateral testicles are within normal limits size configuration. No solid or cystic testicular lesion is seen. Blood flow is documented to each testicle. There was no hydrocele or varicocele seen on either side. Epididymal regions are unremarkable.      Impression    Unremarkable ultrasound of the scrotum/testicles.      Radiologist location ID: [id number]     CT FEMUR / THIGH RIGHT W IV CONTRAST     Status: None    Narrative    Male, 75 years  old.    CT  FEMUR / THIGH RIGHT W IV CONTRAST performed on 04/19/2020 5:11 PM.    REASON FOR EXAM:  Intractable thigh/ leg pain  RADIATION DOSE: 498 DLP  CONTRAST: 50 ml's of Isovue 370    TECHNIQUE: Intravenous contrast utilized for study. Volumetric acquisition of the right femur/thigh. This CT scanner is equipped with dose reducing technology. The exposure is automatically adjusted according to patient body size in order to deliver the lowest dose possible.    COMPARISON: None available.    FINDINGS: No acute fracture. If the areas of calcified plaque involving the right common femoral artery. Otherwise, the imaged right common and superficial femoral arteries are patent, as is the visualized right popliteal artery. There is nonspecific subcutaneous edema about the knee and the visualized right lower leg. No acute abnormalities within the included pelvic organs.      Impression    1.Nonspecific subcutaneous edema/stranding about the right knee and visualized right lower leg.  2.Right common femoral, superficial femoral, and visualized popliteal arteries are patent.            Radiologist location ID: [id number]     MRI SPINE LUMBOSACRAL W/WO CONTRAST     Status: None    Narrative    Male, 75 years old.    MRI SPINE LUMBOSACRAL W/WO CONTRAST performed on 04/20/2020 1:57 PM.    REASON FOR EXAM:  Right Sciatica    INTRAVENOUS CONTRAST: 13 ml's of Multihance    TECHNIQUE: Multisequence multiplanar MR lumbosacral spine without and with gadolinium.    COMPARISON: MRI lumbosacral spine of 11/29/2018.    FINDINGS: Preserved lumbar lordosis. No acute fracture or traumatic malalignment. Cord and conus are normal in signal and position.    T12-L1: No high-grade spinal canal or neural foraminal stenosis.    L1-L2: Right paracentral disc protrusion. No spinal canal or neural foraminal stenosis.    L2-L3: No spinal canal or neural foraminal stenosis.    L3-L4: Posterior disc bulge. No spinal canal or neural foraminal  stenosis.    L4-L5: Posterior disc bulge. Flattening of lateral recesses. No spinal canal stenosis. Mild left neural foraminal stenosis.    L5-S1: Mild posterior disc bulge. No spinal canal stenosis. Mild left neural foraminal stenosis.    Included superior joints and straight degenerative changes.      Impression    1.Lumbosacral spondylosis without spinal canal stenosis.  2.Mild left L4-L5 and left L5-S1 neural foraminal stenosis.      Radiologist location ID: [id number]         Inpatient Medications:  acetaminophen (TYLENOL) tablet, 500 mg, Oral, Q4H PRN  amLODIPine (NORVASC) tablet, 5 mg, Oral, Daily  atorvastatin (LIPITOR) tablet, 20 mg, Oral, QPM  diphenhydrAMINE (BENADRYL) capsule, 25 mg, Oral, HS PRN  docusate sodium (COLACE) capsule, 100 mg, Oral, 2x/day  elvitegravir-cobicistat-emtricitrabine-tenofovir (GENVOYA) 150-150-200-10 mg per tablet, 1 Tablet, Oral, Daily  enoxaparin PF (LOVENOX) 40 mg/0.4 mL SubQ injection, 40 mg, Subcutaneous, Q24H  gabapentin (NEURONTIN) capsule, 300 mg, Oral, 3x/day  HYDROcodone-acetaminophen (NORCO) 5-325 mg per tablet, 1 Tablet, Oral, Q4H PRN  iopamidol (ISOVUE-370) 76% infusion, 50 mL, Intravenous, Give in Radiology  levothyroxine (SYNTHROID) tablet, 88 mcg, Oral, QAM  magnesium hydroxide (MILK OF MAGNESIA) 400mg  per 23mL oral liquid, 15 mL, Oral, Daily PRN  melatonin tablet, 10.5 mg, Oral, NIGHTLY  morphine 4 mg/mL injection, 4 mg, Intravenous, Q4H PRN  NS flush syringe, 3 mL, Intracatheter, Q8HRS  NS flush syringe, 3 mL, Intracatheter, Q1H PRN  NS flush syringe, 3 mL, Intracatheter, Q8HRS  NS flush syringe, 3 mL, Intracatheter, Q1H PRN  ondansetron (ZOFRAN) 2 mg/mL injection, 4 mg, Intravenous, Q6H PRN  trimethoprim-sulfamethoxazole (BACTRIM DS) 160-800mg  per tablet, 1 Tablet, Oral, 2x/day       ______________________________________________________________________

## 2020-04-20 NOTE — Nurses Notes (Signed)
Pt lying in bed resting. Alert and oriented x 4. Resp even and unlabored on room air, pt is using oxygen as needed at 2L by NC. C/O constant RLE pain, rates as 6/10. Just medicated with Dilaudid IV for pain. Edema noted to RLE, scrotum, and right groin. Ice pack in place to right knee. Side rails up x 3, call bell within reach. Safety measures maintained. Alfonse Spruce, RN  04/20/2020, 15:26

## 2020-04-20 NOTE — Nurses Notes (Signed)
Patient is resting in bed at time of report. Pain medication changed from Dilaudid to Morphine. Patient is anxious with the change. Patient also has PO Norco. Discussed a pain schedule with patient for better comfort. Urinal at bedside as well PO fluids. Bed is locked and in lowest position. Patient on bowel meds and is starting to pass gas. AM nurse discussed use of fracture bed and is at bedside. Patient has phone and call light within reach.   Lovie Macadamia, RN  04/20/2020, 19:44

## 2020-04-20 NOTE — Nurses Notes (Signed)
Pt lying in bed resting. Alert and oriented x 4. Resp even and unlabored with O2 on at 2L by NC. C/O constant RLE pain, rates as 9/10. Medicated with Dilaudid IV for pain. Alternating with Norco and dilaudid to control the patient's pain. Edema noted to RLE, right groin, and scrotum. Ice pack in place. Side rails up x 3, call bell within reach. Safety measures maintained. Alfonse Spruce, RN  04/20/2020, 11:08

## 2020-04-20 NOTE — Nurses Notes (Signed)
Pt lying in bed visiting with his wife. C/O constant RLE pain, rates as 6/10. Just previously medicated with Dilaudid IV for pain. Side rails up x 3, call bell within reach. Safety measures maintained. Alfonse Spruce, RN  04/20/2020, 12:24

## 2020-04-21 ENCOUNTER — Observation Stay (HOSPITAL_COMMUNITY): Payer: 59

## 2020-04-21 DIAGNOSIS — M79604 Pain in right leg: Secondary | ICD-10-CM

## 2020-04-21 DIAGNOSIS — M25552 Pain in left hip: Secondary | ICD-10-CM

## 2020-04-21 DIAGNOSIS — Z21 Asymptomatic human immunodeficiency virus [HIV] infection status: Secondary | ICD-10-CM

## 2020-04-21 LAB — CBC WITH DIFF
BASOPHIL #: <0.1 10*3/uL (ref <=0.20)
BASOPHIL %: 0 %
EOSINOPHIL #: <0.1 10*3/uL (ref <=0.50)
EOSINOPHIL %: 0 %
HCT: 36 % — ABNORMAL LOW (ref 38.9–52.0)
HGB: 12.3 g/dL — ABNORMAL LOW (ref 13.4–17.5)
IMMATURE GRANULOCYTE #: 0.19 10*3/uL — ABNORMAL HIGH (ref <0.10)
IMMATURE GRANULOCYTE %: 2 % — ABNORMAL HIGH (ref 0–1)
LYMPHOCYTE #: 1.12 10*3/uL (ref 1.00–4.80)
LYMPHOCYTE %: 9 %
MCH: 32.3 pg — ABNORMAL HIGH (ref 26.0–32.0)
MCHC: 34.2 g/dL (ref 31.0–35.5)
MCV: 94.5 fL (ref 78.0–100.0)
MONOCYTE #: 0.87 10*3/uL (ref 0.20–1.10)
MONOCYTE %: 7 %
MPV: 10.1 fL (ref 8.7–12.5)
NEUTROPHIL #: 9.88 10*3/uL — ABNORMAL HIGH (ref 1.50–7.70)
NEUTROPHIL %: 82 %
PLATELETS: 198 10*3/uL (ref 150–400)
RBC: 3.81 10*6/uL — ABNORMAL LOW (ref 4.50–6.10)
RDW-CV: 14 % (ref 11.5–15.5)
WBC: 12.1 10*3/uL — ABNORMAL HIGH (ref 3.7–11.0)

## 2020-04-21 LAB — BASIC METABOLIC PANEL
ANION GAP: 9 mmol/L (ref 4–13)
BUN/CREA RATIO: 21 (ref 6–22)
BUN: 21 mg/dL (ref 8–25)
CALCIUM: 8.6 mg/dL — ABNORMAL LOW (ref 8.8–10.2)
CHLORIDE: 98 mmol/L (ref 96–111)
CO2 TOTAL: 25 mmol/L (ref 23–31)
CREATININE: 0.98 mg/dL (ref 0.75–1.35)
ESTIMATED GFR: 76 mL/min/BSA (ref >=60)
GLUCOSE: 144 mg/dL — ABNORMAL HIGH (ref 65–125)
POTASSIUM: 4.6 mmol/L (ref 3.5–5.1)
SODIUM: 132 mmol/L — ABNORMAL LOW (ref 136–145)

## 2020-04-21 LAB — C-REACTIVE PROTEIN(CRP),INFLAMMATION: CRP INFLAMMATION: 292.4 mg/L — ABNORMAL HIGH (ref <8.0)

## 2020-04-21 LAB — URINE CULTURE: URINE CULTURE: >100000 — AB

## 2020-04-21 LAB — SEDIMENTATION RATE: ERYTHROCYTE SEDIMENTATION RATE (ESR): 122 mm/hr — ABNORMAL HIGH (ref 0–15)

## 2020-04-21 LAB — MAGNESIUM: MAGNESIUM: 2.4 mg/dL (ref 1.8–2.6)

## 2020-04-21 NOTE — Care Management Notes (Signed)
Patient is a 30-day readmit.  Patient was assessed by case manager. Will follow-up if needed.    Debroah Baller  04/21/2020, 10:22

## 2020-04-21 NOTE — Care Plan (Signed)
Pt rested in bed today. Pt received PRN pain medication. Went from MRI today. Planning on having surgery tomorrow. Call light in reach. All safety measures continued and safety maintained. Will continue to monitor

## 2020-04-21 NOTE — Consults (Signed)
[facility]  Orthopaedic Surgery   Initial Consult Note    Date of Service:  04/21/2020   Inpatient Admission Date: 04/19/2020    Information Obtained from: patient  Chief Complaint:  Right leg pain    HPI:  [name] is a 75 y.o. ., White  male who presents with right leg pain.  The patient was admitted to the hospitalist service on March 20th due to relatively acute onset of pain in his right leg.  The patient seems to have pain in his hip groin that radiates all the way down his leg to his foot.  He denies any associated neurological symptoms.  His pain is severe he is really unable to move his right leg or his right hip.  Patient states she had a low-grade fever upon his 1st admission to the hospital.  To my knowledge she has not had any recent fevers.  He has been seen by Neurosurgery due to this sound like radicular type situation.  There is nothing on his MRI scan correlates to a right lumbar radiculopathy.  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    Past Medical History  Past Medical History:   Diagnosis Date    Esophageal reflux     High cholesterol     HTN (hypertension)     Hypothyroidism             Past Surgical History  Past Surgical History:   Procedure Laterality Date    COLON SURGERY      HX HERNIA REPAIR      WRIST SURGERY Right          Allergies  No Known Allergies       Social History  Social History     Tobacco Use    Smoking status: Never Smoker    Smokeless tobacco: Never Used   Substance Use Topics    Alcohol use: Not Currently       Family History  Family Medical History:    None            ROS: Other than ROS in the HPI, all other systems were negative.    EXAM:  Patient Vitals for the past 24 hrs:   BP Temp Pulse Resp SpO2   04/21/20 0939 -- -- -- 16 --   04/21/20 0806 132/76 36.8 C (98.2 F) 78 16 96 %   04/21/20 0541 -- -- -- 17 --   04/21/20 0326 -- -- -- 18 --   04/21/20 0052 -- -- -- 17 --   04/21/20 0018  116/77 37 C (98.6 F) 83 18 93 %   04/20/20 2250 -- -- -- 18 --   04/20/20 2124 -- -- -- 18 --   04/20/20 1838 -- -- -- 18 --   04/20/20 1658 -- -- -- 18 --   04/20/20 1526 -- -- -- 18 --   04/20/20 1227 -- -- -- 18 --        Patient is lying in bed  General: Appears in good health and no distress  Lungs: Non-labored breathing.  Cardiovascular: Regular rate and rhythm.  Abdomen: Abdomen soft and non-tender.  Skin: Warm and dry.    Ortho exam areas:  Elderly white male sitting up in bed he is alert pleasant cooperative he seems to be in obvious discomfort.  This morning he is afebrile and vital signs are unremarkable within normal limits.  Exam  of the right hip demonstrates a positive logroll he has severe pain with any attempts at motion of the hip he could not even perform a Stinchfield test there is no obvious swelling or mass lesions around the hip.  He does state that there was some swelling around his hip area when he 1st was admitted.  His hip is grossly irritable.  Right lower extremity is grossly neurovascular intact.  There is no knee joint effusion no tenderness about the knee no signs of knee instability      Imaging Studies:  AP the pelvis AP lateral radiographs the right hip were reviewed these films are unremarkable there is no significant joint space narrowing the hip I do not see any osseous lesions.  There is no evidence of any periosteal reaction no fractures are seen  Labs white count is 12.1 with a left shift.  Erythrocyte sedimentation rate is 129 C-reactive protein is 292    Assessment:  This patient has an obvious infectious versus inflammatory process going on.  My concern is a septic right hip joint hip joint is very irritable    Plan:  MRI scan of the right hip to evaluate for any fluid in the right hip joint.  If any hip joint fluid is obtained I will recommend that we continue with an open irrigation debridement of the right hip via posterior approach    Glennon Hamilton, MD

## 2020-04-21 NOTE — Care Management Notes (Signed)
[facility] and [facility] informed that [facility] have no beds available.

## 2020-04-21 NOTE — Progress Notes (Addendum)
[facility]  HOSPITALIST PROGRESS NOTE      [name]                     75 y.o. male 415/A    Date of service: 04/21/2020      Date of Admission:  04/19/2020   Code Status: Full Code Inpatient     HPI:    [name] is a 75 y.o. male with PMH significant for hypertension, hyperlipidemia, hypothyroidism, HIV, and GERD who presented to the emergency department with right groin pain that has progressively worsened since Monday.  Patient states that last Saturday (8 days ago), he picked up a case of soda and tea and noticed that he pulled something in his right groin but he was able to continue with his ALDs without much troubles but on Monday after playing golf and helping his mother-in-law get in & out of her wheelchair, he started having more sever pain.  States that the pain initially improved with ice/heat/and Ben-Gay, but progressively worsened over the past week.  Patient states that he had difficulty sleeping due to the pain and was up at 3:00 a.m. and severe pain which prompted him to go to the emergency department.  Patient with significant right inguinal pain that is worse with palpation.  States that pain radiates to his right testicle, right lateral hip, anterior thigh, and medial calf.  States lifting his right leg up is extremely painful and he is having difficulty adjusting himself in bed due to the pain as well.     Pt has been going to a pain clinic associated with an outside facility [facility]), there he reports that he had been getting steroid injections and recently was referred to another pain manage,ent office for uncontrolled back pain.     Patient also reports night sweats and urinary urgency over the past 2-3 weeks.  Denies hematuria or suprapubic pain.  Denies subjective fever or chills, nausea, vomiting, or diarrhea. ROS also negative for chest pain or pressure, shortness of breath, or recent illness.    In ED, CT the abdomen pelvis  with IV contrast revealed fatty density along the right femoral vascular structures likely representing a small femoral hernia.  Also has a fat containing left inguinal hernia which is small.     Of note, CT also reveals pancreatic mass that was also identified on CTA of the chest 03/24/2020.  I spoke with the radiologist who compared today's images from images last admission and states there has been no change since February.  Patient was also admitted in February for chest pain. 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 Our images were reviewed with our radiologist in comparison to the report obtained from [facility] from 2017, and no intervention needed at this time.  A fine needle aspiration 2017 revealed benign etiology.  It is [facility] of this area        WBC 12.4, hemoglobin 15, CMP unremarkable, CK not elevated, UA positive for UTI        Subjective / Interval history:  Patient's symptoms are clinically is consistent with right radiculopathy.  MRI of the lumbar spine was performed and Neurosurgery was consulted.  However as per Neurosurgery patient's symptoms are not related to radiculopathy.  I will obtain consultation with Orthopedic to see  if they can figure out where the pain is coming from.  I will discontinue Dilaudid and start morphine p.r.n. for pain will continue Norco as before.    04/21/2020----patient was seen by Orthopedic Dr. Parke Simmers .  MRI of the hip and thigh was performed.    MRI demonstrates significant fluid within the hip joint and the surrounding musculature consistent with septic arthritis of the hip joint this also is consistent with his clinical examination and has significant elevation of sed rate and CRP.  Orthopedics plan to do open irrigation and debridement of the septic hip joint tomorrow.    Please note that patient had  tachycardia, tachypnea and leukocytosis on arrival to emergency room.  Patient was given IV fluid hydration and started on Rocephin.  This clinical scenario is consistent with sepsis which resolved with IV fluid and antibiotics.       DENIES: chest pain, SOB, cough/sputum, fever/chills, abdo pain, nausea/ vomiting/ diarrhea/ constipation.    PHYSICAL EXAM:  VITALS: BP 125/75    Pulse 94    Temp 37 C (98.6 F)    Resp 18    Ht 1.778 m ($Remove'5\' 10"'[id number]$ )    Wt 79.4 kg (175 lb)    SpO2 95%    BMI 25.11 kg/m         GENERAL:   Pt is a pleasant, well-nourished, well-developed 75 y.o. male who is resting comfortably in bed in NAD. Appears stated age  40:  head normocephalic, symmetrical facies. EOM intact b/l. Sclera non-icteric, non-injected. Oropharyngeal mucous membranes are moist w/o erythema/exudates   CV: RRR. No murmurs. No BLE edema. 2+ pulses present b/l.   LUNGS: CTAB, No rhonchi, rales, wheezes.   BREAST: deferred   RECTAL: deferred  GI: (+) BS in all 4 quadrants. soft, NT/ ND. No rigidity/ guarding/ rebound.  GU: deferred  MSK: full spontaneous ROM of all 4 extremities. Gait not assessed at this time.  SKIN: No significant lesions, rashes, ecchymoses noted.   NEURO: AAOx4, CN grossly intact. Sensation intact b/l. No focal deficits.  PSYCH: Mood, behavior and affect normal w/ Intact judgement & insight         Assessment/Plan:  1. Right groin pain 2nd to  septic hip joint:  as mentioned above, patient being taken to OR tomorrow for open irrigation of the septic hip joint.  Cultures will be obtained during procedures.  2.   3. As mentioned above MRI of the lumbar spine was also performed neurosurgery was also consulted.  Patient pain is not from radiculopathy as per Neurosurgery.  Orthopedic will be consulted.  Will continue Norco as before I will discontinue Dilaudid and start morphine p.r.n. for pain  4.   5. UTI:  Have initiated Rocephin.  Urine culture pending.  6. Essential hypertension:  Blood pressures are  stable.  Continue patient's home dose of Norvasc  7. Hyperlipidemia:  Continue statin  8. Hypothyroidism:  Continue Synthroid  9. HIV positive:  Continue patient's home antiviral medications.  10. History of prostate cancer:  Follows with the [facility].  11. 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  [facility]  12.  sepsis on admission--resolved with antibiotics and IV fluid hydration      DVT prophylaxis: Lovenox            ANCILLARY DATA:  Results for orders placed or performed during the hospital encounter of 04/19/20 (from the past 24 hour(s))  CBC/DIFF    Narrative    The following orders were created for panel order CBC/DIFF.  Procedure                               Abnormality         Status                     ---------                               -----------         ------                     CBC WITH FFMB[[id number]]                Abnormal            Final result                 Please view results for these tests on the individual orders.   BASIC METABOLIC PANEL   Result Value Ref Range    SODIUM 132 (L) 136 - 145 mmol/L    POTASSIUM 4.6 3.5 - 5.1 mmol/L    CHLORIDE 98 96 - 111 mmol/L    CO2 TOTAL 25 23 - 31 mmol/L    ANION GAP 9 4 - 13 mmol/L    CALCIUM 8.6 (L) 8.8 - 10.2 mg/dL    GLUCOSE 144 (H) 65 - 125 mg/dL    BUN 21 8 - 25 mg/dL    CREATININE 0.98 0.75 - 1.35 mg/dL    BUN/CREA RATIO 21 6 - 22    ESTIMATED GFR 76 >=60 mL/min/BSA   MAGNESIUM   Result Value Ref Range    MAGNESIUM 2.4 1.8 - 2.6 mg/dL   CBC WITH DIFF   Result Value Ref Range    WBC 12.1 (H) 3.7 - 11.0 x103/uL    RBC 3.81 (L) 4.50 - 6.10 x106/uL    HGB 12.3 (L) 13.4 - 17.5 g/dL    HCT 36.0 (L) 38.9 - 52.0 %    MCV 94.5 78.0 - 100.0 fL    MCH 32.3 (H) 26.0 - 32.0 pg    MCHC 34.2 31.0 - 35.5 g/dL    RDW-CV 14.0 11.5 - 15.5 %    PLATELETS 198 150 - 400 x103/uL    MPV 10.1 8.7 - 12.5 fL    NEUTROPHIL % 82 %    LYMPHOCYTE % 9 %    MONOCYTE % 7 %    EOSINOPHIL %  0 %    BASOPHIL % 0 %    NEUTROPHIL # 9.88 (H) 1.50 - 7.70 x103/uL    LYMPHOCYTE # 1.12 1.00 - 4.80 x103/uL    MONOCYTE # 0.87 0.20 - 1.10 x103/uL    EOSINOPHIL # <0.10 <=0.50 x103/uL    BASOPHIL # <0.10 <=0.20 x103/uL    IMMATURE GRANULOCYTE % 2 (H) 0 - 1 %    IMMATURE GRANULOCYTE # 0.19 (H) <0.10 x103/uL   SEDIMENTATION RATE   Result Value Ref Range    ERYTHROCYTE SEDIMENTATION RATE (ESR) 122 (H) 0 - 15 mm/hr   C-REACTIVE PROTEIN(CRP),INFLAMMATION   Result Value Ref Range    CRP INFLAMMATION 292.4 (H) <8.0 mg/L        Diagnostic studies:  Results for orders placed or performed during  the hospital encounter of 04/19/20 (from the past 72 hour(s))   XR HIP RIGHT W PELVIS 2-3 VIEWS     Status: None    Narrative    Male, 75 years old.    XR HIP RIGHT W PELVIS 2-3 VIEWS performed on 04/19/2020 10:16 AM.    REASON FOR EXAM:  Right hip/ groin pain    FINDINGS: 2 views right hip are submitted for interpretation.      Impression    1. Patient is osteopenic.  2. There are mild degenerative changes without displaced fracture or dislocation.  3. Visualized bowel pattern shows a moderate to large amount of stool.      Radiologist location ID: [id number]     CT ABDOMEN PELVIS W IV CONTRAST     Status: Abnormal    Narrative    Male, 75 years old.    CT ABDOMEN PELVIS W IV CONTRAST performed on 04/19/2020 12:46 PM.    REASON FOR EXAM:  Right groin pain    CT Dose:  1465 DLP (mGy*cm)  This CT scanner is equipped with dose reducing technology. The mAs is automatically adjusted to patient's body size in order to deliver the lowest dose possible.    CONTRAST: 100 ml's of Isovue 370    Findings: Axial postcontrast CT imaging of the abdomen and pelvis was performed along with sagittal and coronal reformats. No prior studies are available for direct comparison. The included lung bases show some mild consolidative atelectasis and likely trace pleural effusions.    There is a 5.5 x 6 x 5 cm multiloculated heterogeneous mass involving  the tail the pancreas. There are further couple small cystic areas noted at the head of the pancreas. There is no evidence of surrounding inflammation or fluid. There is no peripancreatic adenopathy.    The liver shows a few simple appearing cysts. However there are also couple small hypodensities that are too small to further characterize. The gallbladder is mildly distended but no stone, sludge, wall thickening or surrounding inflammation is detected. There is no filling defect within the common duct which is smoothly marginated.    The spleen, adrenal glands and kidneys show no acute process. Small cysts are seen at each kidney. The delayed sequence shows prompt excretion of contrast material bilaterally.    The small and large bowel show no obstructive process. There is a moderate amount of stool throughout the colon. No focal bowel wall thickening or surrounding inflammation is detected. There is no free fluid or air. Urinary bladder is distended symmetrically. Patient is status post prostatectomy. There is no free fluid or air. Patient does appear to have a right femoral hernia with fat extending along the common femoral vein and artery. There is a small fat-containing left inguinal hernia. Bony structures are intact.      Impression    1. 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 GI consult would be recommended for potential scope/ERCP and washings. No definite evidence to suggest distal metastasis is seen.  2. There are some hypodensities involving the liver with the larger likely representing cysts although there are scattered smaller areas that are too small to further characterize.  3. There is fatty density along the right femoral vascular structures likely representing a small femoral hernia. Patient also  has a fat-containing left inguinal hernia which is  small.  4. Cystic changes are noted at the kidneys.  5. Mild bibasilar atelectasis and cardiomegaly are noted.      Radiologist location ID: [id number]     US SCROTUM     Status: None    Narrative    Male, 75 years old.    US SCROTUM performed on 04/19/2020 3:03 PM.    REASON FOR EXAM:  Right testicular pain    FINDINGS: Ultrasound evaluation of the scrotum/testicles was performed using grayscale and color Doppler imaging the bilateral testicles are within normal limits size configuration. No solid or cystic testicular lesion is seen. Blood flow is documented to each testicle. There was no hydrocele or varicocele seen on either side. Epididymal regions are unremarkable.      Impression    Unremarkable ultrasound of the scrotum/testicles.      Radiologist location ID: [id number]     CT FEMUR / THIGH RIGHT W IV CONTRAST     Status: None    Narrative    Male, 75 years old.    CT FEMUR / THIGH RIGHT W IV CONTRAST performed on 04/19/2020 5:11 PM.    REASON FOR EXAM:  Intractable thigh/ leg pain  RADIATION DOSE: 498 DLP  CONTRAST: 50 ml's of Isovue 370    TECHNIQUE: Intravenous contrast utilized for study. Volumetric acquisition of the right femur/thigh. This CT scanner is equipped with dose reducing technology. The exposure is automatically adjusted according to patient body size in order to deliver the lowest dose possible.    COMPARISON: None available.    FINDINGS: No acute fracture. If the areas of calcified plaque involving the right common femoral artery. Otherwise, the imaged right common and superficial femoral arteries are patent, as is the visualized right popliteal artery. There is nonspecific subcutaneous edema about the knee and the visualized right lower leg. No acute abnormalities within the included pelvic organs.      Impression    1.Nonspecific subcutaneous edema/stranding about the right knee and visualized right lower leg.  2.Right common femoral,  superficial femoral, and visualized popliteal arteries are patent.            Radiologist location ID: [id number]     MRI SPINE LUMBOSACRAL W/WO CONTRAST     Status: None    Narrative    Male, 75 years old.    MRI SPINE LUMBOSACRAL W/WO CONTRAST performed on 04/20/2020 1:57 PM.    REASON FOR EXAM:  Right Sciatica    INTRAVENOUS CONTRAST: 13 ml's of Multihance    TECHNIQUE: Multisequence multiplanar MR lumbosacral spine without and with gadolinium.    COMPARISON: MRI lumbosacral spine of 11/29/2018.    FINDINGS: Preserved lumbar lordosis. No acute fracture or traumatic malalignment. Cord and conus are normal in signal and position.    T12-L1: No high-grade spinal canal or neural foraminal stenosis.    L1-L2: Right paracentral disc protrusion. No spinal canal or neural foraminal stenosis.    L2-L3: No spinal canal or neural foraminal stenosis.    L3-L4: Posterior disc bulge. No spinal canal or neural foraminal stenosis.    L4-L5: Posterior disc bulge. Flattening of lateral recesses. No spinal canal stenosis. Mild left neural foraminal stenosis.    L5-S1: Mild posterior disc bulge. No spinal canal stenosis. Mild left neural foraminal stenosis.    Included superior joints and straight degenerative changes.      Impression    1.Lumbosacral spondylosis without spinal canal stenosis.  2.Mild left L4-L5 and left L5-S1 neural foraminal stenosis.  Radiologist location ID: [id number]     MRI HIP RIGHT WO CONTRAST     Status: None    Narrative    Male, 75 years old.    MRI HIP RIGHT WO CONTRAST performed on 04/21/2020 2:58 PM.    REASON FOR EXAM:  SEVERE HIP PAIN, IRRITABLE HIP ON EXAM , LOW GRADE FEVER    TECHNIQUE: Multisequence multiplanar MRI right hip without gadolinium.    COMPARISON: Right femur/thigh CT of 04/19/2020.    FINDINGS:  Moderate size right hip joint effusion. Pericapsular edema throughout the right hip soft tissues. Diffuse nonspecific edema like signal involving the right adductor muscles as well as the  right gluteal, hamstrings, and quadriceps muscle groups about the right hip. Fascial edema extends to the superficial fascia. Subcutaneous edema involving the right lateral hip.    No acute fracture. No femoral head AVN. No infiltrating abnormal marrow signal. Subchondral reactive marrow signal changes involving the weightbearing aspect of the right acetabulum, likely degenerative in etiology. Mild nonspecific edema like signal involving the right peritrochanteric soft tissues.      Impression    Moderate size right hip joint effusion with diffuse edema like signal throughout the right hip musculature including the adductor, gluteal, quadriceps, and hamstrings groups. Findings concerning for right hip septic arthritis with reactive versus infectious myositis throughout the right hip musculature.      Radiologist location ID: [id number]         Inpatient Medications:  acetaminophen (TYLENOL) tablet, 500 mg, Oral, Q4H PRN  amLODIPine (NORVASC) tablet, 5 mg, Oral, Daily  atorvastatin (LIPITOR) tablet, 20 mg, Oral, QPM  diphenhydrAMINE (BENADRYL) capsule, 25 mg, Oral, HS PRN  docusate sodium (COLACE) capsule, 100 mg, Oral, 2x/day  elvitegravir-cobicistat-emtricitrabine-tenofovir (GENVOYA) 150-150-200-10 mg per tablet, 1 Tablet, Oral, Daily  enoxaparin PF (LOVENOX) 40 mg/0.4 mL SubQ injection, 40 mg, Subcutaneous, Q24H  gabapentin (NEURONTIN) capsule, 300 mg, Oral, 3x/day  HYDROcodone-acetaminophen (NORCO) 5-325 mg per tablet, 1 Tablet, Oral, Q4H PRN  iopamidol (ISOVUE-370) 76% infusion, 50 mL, Intravenous, Give in Radiology  levothyroxine (SYNTHROID) tablet, 88 mcg, Oral, QAM  magnesium hydroxide (MILK OF MAGNESIA) 476m per 524moral liquid, 15 mL, Oral, Daily PRN  melatonin tablet, 10.5 mg, Oral, NIGHTLY  morphine 4 mg/mL injection, 4 mg, Intravenous, Q4H PRN  NS flush syringe, 3 mL, Intracatheter, Q8HRS  NS flush syringe, 3 mL, Intracatheter, Q1H PRN  NS flush syringe, 3 mL, Intracatheter, Q8HRS  NS flush syringe, 3  mL, Intracatheter, Q1H PRN  ondansetron (ZOFRAN) 2 mg/mL injection, 4 mg, Intravenous, Q6H PRN  trimethoprim-sulfamethoxazole (BACTRIM DS) 160-80075mer tablet, 1 Tablet, Oral, 2x/day       ______________________________________________________________________

## 2020-04-21 NOTE — Nurses Notes (Signed)
Pt laying in bed, bed locked and in lowest position, call light in reach, bed alarm audible and activated. pt alert and oriented x4, lung sounds clear in all fields, resp even and unlabored. Active bowel sounds in all quadrant. No c/o n/v.  c/o pain 10/10 at this time, prn medication given. Iv dressing is dry and intact and locked. Bathroom offered and refused. No other needs voiced at this time. All safety measures continued and safety maintained. Will continue to monitor.

## 2020-04-21 NOTE — Progress Notes (Signed)
MRI of the right hip is been completed.  MRI demonstrates significant fluid within the hip joint and the surrounding musculature consistent with septic arthritis of the hip joint this also is consistent with his clinical examination and has significant elevation of sed rate and CRP.  Treatment plan discussed in detail with the patient.  Our plan tomorrow be an open irrigation debridement of a septic hip joint via a posterior approach.  We discussed risks benefits options postoperative course in great detail with the patient understands wished to proceed preop informed consent has been obtained.  NPO after midnight. orders written

## 2020-04-21 NOTE — Care Plan (Signed)
Patient lying in bed but is unable to ambulate still at this time. Patient currently on a 2h/2h rotation of medications for pain. Patient appears to be in better control but still rating of 08/10. Ice used intermittently and PO fluids at bedside. Patient is able to use urinal and passing flatus. Bowel regimen being used.   [name] Macadamia, RN  04/21/2020, 03:52    Problem: Adult Inpatient Plan of Care  Goal: Plan of Care Review  Flowsheets (Taken 04/21/2020 0349)  Plan of Care Reviewed With: patient  Progress: no change     Problem: Pain Acute  Goal: Acceptable Pain Control and Functional Ability  Note: Continue to work on pain control. Patient currently on 2h/2h rotation.

## 2020-04-21 NOTE — Progress Notes (Signed)
Patient continues to have severe pain emanating from the right thigh down to the right anterior shin.  I will await evaluation by Neurology.  If they are unable to provide a explanation for the pain an MRI scan of the lumbosacral plexus and right thigh will be ordered

## 2020-04-22 ENCOUNTER — Inpatient Hospital Stay (HOSPITAL_COMMUNITY): Payer: 59 | Admitting: Nurse Practitioner

## 2020-04-22 ENCOUNTER — Encounter (HOSPITAL_COMMUNITY): Admission: EM | Disposition: A | Discharge status: Home Health | Payer: Self-pay | Attending: Emergency Medicine

## 2020-04-22 DIAGNOSIS — M009 Pyogenic arthritis, unspecified: Secondary | ICD-10-CM

## 2020-04-22 DIAGNOSIS — M25551 Pain in right hip: Secondary | ICD-10-CM

## 2020-04-22 HISTORY — PX: HIP SURGERY: SHX245

## 2020-04-22 LAB — MAGNESIUM: MAGNESIUM: 2.3 mg/dL (ref 1.8–2.6)

## 2020-04-22 LAB — BASIC METABOLIC PANEL
ANION GAP: 12 mmol/L (ref 4–13)
BUN/CREA RATIO: 19 (ref 6–22)
BUN: 21 mg/dL (ref 8–25)
CALCIUM: 9.1 mg/dL (ref 8.8–10.2)
CHLORIDE: 98 mmol/L (ref 96–111)
CO2 TOTAL: 23 mmol/L (ref 23–31)
CREATININE: 1.09 mg/dL (ref 0.75–1.35)
ESTIMATED GFR: 67 mL/min/BSA (ref >=60)
GLUCOSE: 122 mg/dL (ref 65–125)
POTASSIUM: 5 mmol/L (ref 3.5–5.1)
SODIUM: 133 mmol/L — ABNORMAL LOW (ref 136–145)

## 2020-04-22 LAB — CBC WITH DIFF
BASOPHIL #: <0.1 10*3/uL (ref <=0.20)
BASOPHIL %: 0 %
EOSINOPHIL #: <0.1 10*3/uL (ref <=0.50)
EOSINOPHIL %: 0 %
HCT: 38.5 % — ABNORMAL LOW (ref 38.9–52.0)
HGB: 12.9 g/dL — ABNORMAL LOW (ref 13.4–17.5)
IMMATURE GRANULOCYTE #: 0.21 10*3/uL — ABNORMAL HIGH (ref <0.10)
IMMATURE GRANULOCYTE %: 2 % — ABNORMAL HIGH (ref 0–1)
LYMPHOCYTE #: 1.02 10*3/uL (ref 1.00–4.80)
LYMPHOCYTE %: 9 %
MCH: 31.5 pg (ref 26.0–32.0)
MCHC: 33.5 g/dL (ref 31.0–35.5)
MCV: 94.1 fL (ref 78.0–100.0)
MONOCYTE #: 0.72 10*3/uL (ref 0.20–1.10)
MONOCYTE %: 7 %
MPV: 10 fL (ref 8.7–12.5)
NEUTROPHIL #: 8.99 10*3/uL — ABNORMAL HIGH (ref 1.50–7.70)
NEUTROPHIL %: 82 %
PLATELETS: 235 10*3/uL (ref 150–400)
RBC: 4.09 10*6/uL — ABNORMAL LOW (ref 4.50–6.10)
RDW-CV: 14.1 % (ref 11.5–15.5)
WBC: 11 10*3/uL (ref 3.7–11.0)

## 2020-04-22 LAB — BODY FLUID CELL COUNT
FLUID BASOPHILS: 0 %
FLUID EOSINOPHIL: 0 %
FLUID LYMPHS: 7 %
FLUID MONOS: 2 %
FLUID SEGS: 91 %
RBC FLUID: 684000 cells/cubic mm — ABNORMAL HIGH (ref 0–5)
WBC FLUID: 500 cells/cubic mm — ABNORMAL HIGH (ref 0–5)

## 2020-04-22 LAB — CREATINE KINASE (CK), TOTAL, SERUM: CREATINE KINASE: 37 U/L — ABNORMAL LOW (ref 45–225)

## 2020-04-22 SURGERY — IRRIGATION AND DEBRIDEMENT HIP
Anesthesia: General | Site: Hip | Laterality: Right | Wound class: Clean Wound: Uninfected operative wounds in which no inflammation occurred

## 2020-04-22 MED ORDER — DEXAMETHASONE SODIUM PHOSPHATE 4 MG/ML INJECTION SOLUTION
INTRAMUSCULAR | Status: AC
Start: 2020-04-22 — End: 2020-04-22
  Filled 2020-04-22: qty 1

## 2020-04-22 MED ORDER — SODIUM CHLORIDE 0.9 % (FLUSH) INJECTION SYRINGE
3.0000 mL | INJECTION | Freq: Three times a day (TID) | INTRAMUSCULAR | Status: DC
Start: 2020-04-22 — End: 2020-04-28
  Administered 2020-04-22 – 2020-04-23 (×3): 0 mL
  Administered 2020-04-23: 3 mL
  Administered 2020-04-23 – 2020-04-24 (×3): 0 mL
  Administered 2020-04-24: 3 mL
  Administered 2020-04-25: 0 mL
  Administered 2020-04-25: 3 mL
  Administered 2020-04-25: 0 mL
  Administered 2020-04-26: 3 mL
  Administered 2020-04-26 – 2020-04-28 (×7): 0 mL

## 2020-04-22 MED ORDER — CEFAZOLIN 1 GRAM SOLUTION FOR INJECTION
INTRAMUSCULAR | Status: AC
Start: 2020-04-22 — End: 2020-04-22
  Filled 2020-04-22: qty 20

## 2020-04-22 MED ORDER — HYDROMORPHONE 0.5 MG/0.5 ML INJECTION SYRINGE
0.2500 mg | INJECTION | INTRAMUSCULAR | Status: DC | PRN
Start: 2020-04-22 — End: 2020-04-22

## 2020-04-22 MED ORDER — LACTATED RINGERS INTRAVENOUS SOLUTION
INTRAVENOUS | Status: DC | PRN
Start: 2020-04-22 — End: 2020-04-22

## 2020-04-22 MED ORDER — IPRATROPIUM 0.5 MG-ALBUTEROL 3 MG (2.5 MG BASE)/3 ML NEBULIZATION SOLN
3.0000 mL | INHALATION_SOLUTION | Freq: Once | RESPIRATORY_TRACT | Status: DC | PRN
Start: 2020-04-22 — End: 2020-04-22

## 2020-04-22 MED ORDER — FENTANYL (PF) 50 MCG/ML INJECTION SOLUTION
Freq: Once | INTRAMUSCULAR | Status: DC | PRN
Start: 2020-04-22 — End: 2020-04-22
  Administered 2020-04-22: 50 ug via INTRAVENOUS
  Administered 2020-04-22: 100 ug via INTRAVENOUS
  Administered 2020-04-22: 50 ug via INTRAVENOUS

## 2020-04-22 MED ORDER — SUGAMMADEX 100 MG/ML INTRAVENOUS SOLUTION
INTRAVENOUS | Status: AC
Start: 2020-04-22 — End: 2020-04-22
  Filled 2020-04-22: qty 2

## 2020-04-22 MED ORDER — FENTANYL (PF) 50 MCG/ML INJECTION SOLUTION
INTRAMUSCULAR | Status: AC
Start: 2020-04-22 — End: 2020-04-22
  Filled 2020-04-22: qty 2

## 2020-04-22 MED ORDER — ROCURONIUM 10 MG/ML INTRAVENOUS SOLUTION
INTRAVENOUS | Status: AC
Start: 2020-04-22 — End: 2020-04-22
  Filled 2020-04-22: qty 5

## 2020-04-22 MED ORDER — METOCLOPRAMIDE 5 MG/ML INJECTION SOLUTION
10.0000 mg | Freq: Once | INTRAMUSCULAR | Status: DC | PRN
Start: 2020-04-22 — End: 2020-04-22

## 2020-04-22 MED ORDER — MIDAZOLAM 1 MG/ML INJECTION SOLUTION
Freq: Once | INTRAMUSCULAR | Status: DC | PRN
Start: 2020-04-22 — End: 2020-04-22
  Administered 2020-04-22: 2 mg via INTRAVENOUS

## 2020-04-22 MED ORDER — SODIUM CHLORIDE 0.9 % INTRAVENOUS SOLUTION
6.0000 mg/kg | INTRAVENOUS | Status: DC
Start: 2020-04-22 — End: 2020-04-24
  Administered 2020-04-22: 21:00:00 0 mg via INTRAVENOUS
  Administered 2020-04-22 – 2020-04-23 (×2): 500 mg via INTRAVENOUS
  Administered 2020-04-23: 19:00:00 0 mg via INTRAVENOUS
  Filled 2020-04-22 (×2): qty 10

## 2020-04-22 MED ORDER — MIDAZOLAM 1 MG/ML INJECTION SOLUTION
INTRAMUSCULAR | Status: AC
Start: 2020-04-22 — End: 2020-04-22
  Filled 2020-04-22: qty 2

## 2020-04-22 MED ORDER — RACEPINEPHRINE 2.25 % SOLUTION FOR NEBULIZATION
0.5000 mL | INHALATION_SOLUTION | Freq: Once | RESPIRATORY_TRACT | Status: DC | PRN
Start: 2020-04-22 — End: 2020-04-22

## 2020-04-22 MED ORDER — NALOXONE 1 MG/ML INJECTION SYRINGE
1.0000 mg | INJECTION | INTRAMUSCULAR | Status: DC | PRN
Start: 2020-04-22 — End: 2020-04-28

## 2020-04-22 MED ORDER — CEFAZOLIN 1 GRAM SOLUTION FOR INJECTION
Freq: Once | INTRAMUSCULAR | Status: DC | PRN
Start: 2020-04-22 — End: 2020-04-22
  Administered 2020-04-22: 2000 mg via INTRAVENOUS

## 2020-04-22 MED ORDER — DEXAMETHASONE SODIUM PHOSPHATE 4 MG/ML INJECTION SOLUTION
Freq: Once | INTRAMUSCULAR | Status: DC | PRN
Start: 2020-04-22 — End: 2020-04-22
  Administered 2020-04-22: 4 mg via INTRAVENOUS

## 2020-04-22 MED ORDER — MORPHINE 4 MG/ML INTRAVENOUS SOLUTION
4.0000 mg | INTRAVENOUS | Status: DC | PRN
Start: 2020-04-22 — End: 2020-04-22

## 2020-04-22 MED ORDER — ONDANSETRON HCL (PF) 4 MG/2 ML INJECTION SOLUTION
Freq: Once | INTRAMUSCULAR | Status: DC | PRN
Start: 2020-04-22 — End: 2020-04-22
  Administered 2020-04-22: 4 mg via INTRAVENOUS

## 2020-04-22 MED ORDER — HYDROMORPHONE 0.5 MG/0.5 ML INJECTION SYRINGE
0.5000 mg | INJECTION | INTRAMUSCULAR | Status: DC | PRN
Start: 2020-04-22 — End: 2020-04-22

## 2020-04-22 MED ORDER — ACETAMINOPHEN 1,000 MG/100 ML (10 MG/ML) INTRAVENOUS SOLUTION
INTRAVENOUS | Status: AC
Start: 2020-04-22 — End: 2020-04-22
  Filled 2020-04-22: qty 100

## 2020-04-22 MED ORDER — SODIUM CHLORIDE 0.9 % (FLUSH) INJECTION SYRINGE
3.0000 mL | INJECTION | INTRAMUSCULAR | Status: DC | PRN
Start: 2020-04-22 — End: 2020-04-28

## 2020-04-22 MED ORDER — PROPOFOL 10 MG/ML IV BOLUS
INJECTION | Freq: Once | INTRAVENOUS | Status: DC | PRN
Start: 2020-04-22 — End: 2020-04-22
  Administered 2020-04-22: 120 mg via INTRAVENOUS

## 2020-04-22 MED ORDER — ACETAMINOPHEN 1,000 MG/100 ML (10 MG/ML) INTRAVENOUS SOLUTION
Freq: Once | INTRAVENOUS | Status: DC | PRN
Start: 2020-04-22 — End: 2020-04-22
  Administered 2020-04-22: 1000 mg via INTRAVENOUS

## 2020-04-22 MED ORDER — LACTATED RINGERS INTRAVENOUS SOLUTION
INTRAVENOUS | Status: DC
Start: 2020-04-22 — End: 2020-04-24

## 2020-04-22 MED ORDER — SUGAMMADEX 100 MG/ML INTRAVENOUS SOLUTION
Freq: Once | INTRAVENOUS | Status: DC | PRN
Start: 2020-04-22 — End: 2020-04-22
  Administered 2020-04-22: 200 mg via INTRAVENOUS

## 2020-04-22 MED ORDER — ONDANSETRON HCL (PF) 4 MG/2 ML INJECTION SOLUTION
INTRAMUSCULAR | Status: AC
Start: 2020-04-22 — End: 2020-04-22
  Filled 2020-04-22: qty 2

## 2020-04-22 MED ORDER — CEFEPIME 2G IN NS 100ML MINIBAG IVPB
2.0000 g | Freq: Two times a day (BID) | INTRAMUSCULAR | Status: DC
  Administered 2020-04-22 – 2020-04-24 (×4): 2 g via INTRAVENOUS
  Filled 2020-04-22 (×4): qty 100

## 2020-04-22 MED ORDER — PROPOFOL 10 MG/ML INTRAVENOUS EMULSION
INTRAVENOUS | Status: AC
Start: 2020-04-22 — End: 2020-04-22
  Filled 2020-04-22: qty 20

## 2020-04-22 MED ORDER — ONDANSETRON HCL (PF) 4 MG/2 ML INJECTION SOLUTION
4.0000 mg | Freq: Once | INTRAMUSCULAR | Status: DC | PRN
Start: 2020-04-22 — End: 2020-04-22

## 2020-04-22 MED ORDER — ROCURONIUM 10 MG/ML INTRAVENOUS SOLUTION
Freq: Once | INTRAVENOUS | Status: DC | PRN
Start: 2020-04-22 — End: 2020-04-22
  Administered 2020-04-22: 50 mg via INTRAVENOUS

## 2020-04-22 SURGICAL SUPPLY — 87 items
APPL 70% ISPRP 2% CHG 26ML 13. 2X13.2IN CHLRPRP PREP DEHP-FR (WOUND CARE/ENTEROSTOMAL SUPPLY) ×3
APPL 70% ISPRP 2% CHG 26ML CHLRPRP HI-LT ORNG PREP STRL LF  DISP CLR (WOUND CARE SUPPLY) ×3 IMPLANT
BLADE SAW 3.543X.709IN 2 CUT S GTL THK.054IN STRL (CUTTING ELEMENTS)
BLADE SAW 3.543X.709IN 2 CUT SGTL THK.054IN STRL (SURGICAL CUTTING SUPPLIES) IMPLANT
BLADE SAW 77.6X11.2MM RECIPROCATE OFST HVDTY SS THK.77MM LONG STRL LF  DISP (SURGICAL CUTTING SUPPLIES) IMPLANT
BLADE SAW 77.6X11.2X.77MM RECI PROCATE HVDTY SS LONG OFST (CUTTING ELEMENTS)
BLADE SAW 90X21MM SGTL THK1.37MM SYS 6 LF (SURGICAL CUTTING SUPPLIES) IMPLANT
BLADE SW STRYK PRFRM SER THK1. 37MM 90MM 21MM SGTL (CUTTING ELEMENTS)
CANNULA NASAL 7FT ANGL FLXB LI P PLATE CRSH RS LUM TUBE FLR (CANNULA) ×1
CANNULA NASAL 7FT ANGL FLXB LIP PLATE CRSH RS LUM TUBE FLR TIP ADULT ARLF UCIT STD CURVE LF  DISP (CANNULA) ×1 IMPLANT
CIRCUIT BREATHING 2 LIMB 2 FILTER BRTH MASK GAS SAMPLE LINE 33-87IN ADULT 3L LF  PORTEX DISP STR WYE (AIR) ×1 IMPLANT
CIRCUIT BREATHING 2 LIMB 2 FLT R BRTH MASK GAS SAMPLE LINE 33 (AIR) ×1
CLOSURE SKIN STRIPS 1/2X4IN R1547 6/PK 50PK/BX (WOUND CARE/ENTEROSTOMAL SUPPLY)
CONV USE 338048 - PACK SURG HIP STRL DISP LF (CUSTOM TRAYS & PACK) ×1
CONV USE ITEM 306559- GLOVE SURG 7 LF SMTH BEAD CU_F STRL BLU 12IN PROTEXIS (GLOVES AND ACCESSORIES) ×2 IMPLANT
CONV USE ITEM 321837 - GLOVE SURG 7.5 LTX 3 PLY PF BE_AD CUF SMTH STRL BRN TINT (GLOVES AND ACCESSORIES) ×2 IMPLANT
CONV USE ITEM 321853 - GLOVE SURG 8 LF SMTH BEAD CU_F STRL BLU 12IN PROTEXIS (GLOVES AND ACCESSORIES) ×1 IMPLANT
CONV USE ITEM 321855 - GLOVE SURG 6.5 LF SMTH BEAD_CUF STRL BLU 12IN PROTEXIS (GLOVES AND ACCESSORIES) ×1 IMPLANT
CONV USE ITEM 321862 - GLOVE SURG 7.5 LF SMTH BEAD_CUF STRL BLU 12IN PROTEXIS (GLOVES AND ACCESSORIES) ×2 IMPLANT
CONV USE ITEM 321868 - GLOVE SURG 7 LTX ORTHO PF STRL_PROTEXIS DDRGL (GLOVES AND ACCESSORIES) ×4 IMPLANT
CONV USE ITEM 321977 - GLOVE SURG 7.5 LTX ORTHO PF_STRL PROTEXIS DDRGL (GLOVES AND ACCESSORIES) ×1 IMPLANT
COVER STAND 57X30IN PRXM MAYO XL SMS STRL LF  DISP (EQUIPMENT MINOR) ×1 IMPLANT
CRADLE POSITION ARM LMI NONST LF (POSITIONING PRODUCTS) ×1 IMPLANT
CRADLE POSITION ARM LMI NS LF (POSITIONING PRODUCTS) ×1
DISC USE ITEM 163322 - SYRINGE LL 20ML LTX STRL MED (NEEDLES & SYRINGE SUPPLIES) ×1 IMPLANT
DRAPE 2 INCS FILM ANTIMIC 23X1 7IN IOBN STRL SURG (PROTECTIVE PRODUCTS/GARMENTS) ×1
DRAPE 2 INCS FILM ANTIMIC 23X17IN IOBN STRL SURG (PROTECTIVE PRODUCTS/GARMENTS) ×1 IMPLANT
DRAPE 2 INCS FILM ANTIMIC 33X2 3IN IOBN STRL SURG (PROTECTIVE PRODUCTS/GARMENTS)
DRAPE 2 INCS FILM ANTIMIC 33X23IN IOBN STRL SURG (PROTECTIVE PRODUCTS/GARMENTS) IMPLANT
DRAPE ADH 51X47IN STRDRP LF  STRL DISP SURG CLR (PROTECTIVE PRODUCTS/GARMENTS) ×1 IMPLANT
DRAPE ADH 51X47IN U STRDRP LF STRL DISP SURG PLASTIC CLR (PROTECTIVE PRODUCTS/GARMENTS) ×1
DRAPE ADH INCS FLXB FILM 23X17 IN LRG STRDRP LF STRL DISP (PROTECTIVE PRODUCTS/GARMENTS) ×1
DRAPE ADH INCS FLXB FILM 23X17IN LRG STRDRP LF  STRL DISP SURG PLASTIC CLR (PROTECTIVE PRODUCTS/GARMENTS) ×1 IMPLANT
DRAPE MAYOSTAND CVR 57X30IN XL PRXM LF STRL DISP EQP SMS (EQUIPMENT MINOR) ×1
DRAPE SHEET 77X53IN .75 PRXM L STRL DISP SURG SMS BLU (MISCELLANEOUS PT CARE ITEMS) ×1
DRAPE SHEET 77X53IN .75 PRXM LF  STRL DISP SURG SMS BLU (MISCELLANEOUS PT CARE ITEMS) ×1 IMPLANT
DRESS ALG 10X3.5IN RECT PRIMAS EAL IONIC SILVER FBR (WOUND CARE/ENTEROSTOMAL SUPPLY) ×1
DRESS ALG 10X3.5IN RECT PRIMASEAL IONIC SILVER FBR HYDROCOLLOID ADH ANTIMIC WTPRF OUTR LYR PAD LF (WOUND CARE SUPPLY) ×1 IMPLANT
ELECTRODE PATIENT RTN 9FT VLAB C30- LB RM PHSV ACRL FOAM CORD NONIRRITATE NONSENSITIZE ADH STRP (CAUTERY SUPPLIES) ×1 IMPLANT
ELECTRODE PATIENT RTN 9FT VLAB REM C30- LB PLHSV ACRL FOAM (CAUTERY SUPPLIES) ×1
GARMENT COMPRESS LRG CALF CENT AURA NYL VASOGRAD LTWT BRTHBL (ORTHOPEDICS (NOT IMPLANTS))
GARMENT COMPRESS LRG CALF CENTAURA NYL VASOGRAD LTWT BRTHBL SEQ FIL BLU 24- IN (ORTHOPEDICS (NOT IMPLANTS)) IMPLANT
GARMENT COMPRESS MED CALF CENT AURA NYL VASOGRAD LTWT BRTHBL (ORTHOPEDICS (NOT IMPLANTS)) ×1
GARMENT COMPRESS MED CALF CENTAURA NYL VASOGRAD LTWT BRTHBL SEQ FIL BLU 18- IN (ORTHOPEDICS (NOT IMPLANTS)) ×1 IMPLANT
GLOVE SURG 6.5 LF SMTH BEAD CUF STRL BLU 12IN PROTEXIS (GLOVES AND ACCESSORIES) ×1
GLOVE SURG 7 LF SMTH BEAD CU F STRL BLU 12IN PROTEXIS (GLOVES AND ACCESSORIES) ×2
GLOVE SURG 7 LTX ORTHO PF STRL PROTEXIS DDRGL (GLOVES AND ACCESSORIES) ×4
GLOVE SURG 7.5 LF  PF RGH BEAD CUF N-PYRG STRL STRW 298X96MM BGL PI ULTRATOUCH PLISPRN CURVE (GLOVES AND ACCESSORIES) ×3 IMPLANT
GLOVE SURG 7.5 LF PF BEAD CUF MICRO RGH N-PYRG STRL STRW (GLOVES AND ACCESSORIES) ×3
GLOVE SURG 7.5 LF SMTH BEAD CUF STRL BLU 12IN PROTEXIS (GLOVES AND ACCESSORIES) ×2
GLOVE SURG 7.5 LTX 3 PLY PF BE AD CUF SMTH STRL BRN TINT (GLOVES AND ACCESSORIES)
GLOVE SURG 7.5 LTX 3 PLY PF BE_AD CUF SMTH STRL BRN TINT (GLOVES AND ACCESSORIES) ×2
GLOVE SURG 7.5 LTX ORTHO PF STRL PROTEXIS DDRGL (GLOVES AND ACCESSORIES) ×1
GLOVE SURG 8 LF PF SMOOTH BEAD CUF UNDERGLOVE STRL BLU 299X103MM BGL PI INDICATOR (GLOVES AND ACCESSORIES) IMPLANT
GLOVE SURG 8 LF PF SMTH BEAD CUF STRL BLU BGL PI INDCTR (GLOVES AND ACCESSORIES)
GLOVE SURG 8 LF SMTH BEAD CU_F STRL BLU 12IN PROTEXIS (GLOVES AND ACCESSORIES) ×1
GOWN SURG 2XL AAMI L4 IMPRV SL EEVE REINF BRTHBL STRL LF (PROTECTIVE PRODUCTS/GARMENTS) ×1
GOWN SURG 2XL L4 IMPRV REINF BRTHBL STRL LF  DISP BLU AURR PE 49IN (PROTECTIVE PRODUCTS/GARMENTS) ×1 IMPLANT
GOWN SURG 4XL AAMI L3 NONREINF ORCE HKLP CLSR SET IN SLEEVE (DGOW)
GOWN SURG 4XL L3 NONREINFORCE HKLP CLSR SET IN SLEEVE STRL LF  DISP BLU SIRUS SMS 49IN (DGOW) IMPLANT
HANDPC SUCT MEDIVAC YANKAUER B LBS TIP CLR STRL LF DISP (Suction) ×1
HANDPC SUCT MEDIVAC YANKAUER BLBS TIP CLR STRL LF  DISP (Suction) ×1 IMPLANT
HOOD SRG FLYTE STRSHLD SURGICO OL FACE SHLD VIR BRR LTWT LF (PROTECTIVE PRODUCTS/GARMENTS) ×4
HOOD SRG FLYTE ZP PLWY LEN STRL LF  DISP (PROTECTIVE PRODUCTS/GARMENTS) ×4 IMPLANT
KIT PLS LAV PLSVC + COM STRL LF (IRR) ×2 IMPLANT
PACK CUSTOM HIP DYNJ36219C (CUSTOM TRAYS & PACK) ×1
PACK SURG HIP STRL DISP LF (CUSTOM TRAYS & PACK) ×1 IMPLANT
SET ADMIN 105IN 10 GTT/ML IV C LRLNK CNT-FL DUOVENT 3 LL ACT (SOLUTIONS) ×1
SET ADMIN 105IN 10 GTT/ML IV CLRLNK CNT-FL DUOVENT 3 LL ACT BKCHK VALVE RETRACT COL DEHP STRL LF (SOLUTIONS) ×1 IMPLANT
SOL IRRG 0.9% NACL 1000ML PLASTIC PR BTL ISTNC N-PYRG STRL LF (SOLUTIONS) ×1 IMPLANT
SOL IRRG 0.9% NACL 3L ARTHMTC LF (SOLUTIONS) ×1 IMPLANT
SOL IV VIAFLEX LR 1000ML PLASTIC CONTAINR LF (SOLUTIONS) ×1 IMPLANT
SOLUTION IRRG NS 2F7124 1000CC 12/CS (SOLUTIONS) ×1
SOLUTION IV 0.9% IRRG 3000 ML 2B7477 (SOLUTIONS) ×1
SOLUTION IV LR 1000CC 2B2324X 14/CS (SOLUTIONS) ×1
STAPLER SKIN 4.1X6.5MM 35 W STPL CART LF  APS U DISP CLR SS PLASTIC (ENDOSCOPIC SUPPLIES) ×1 IMPLANT
STAPLER SKIN WIDE 35W 8886803712 12/BX (INSTRUMENTS ENDOMECHANICAL) ×1
STRIP 4X.5IN STRSTRP PLSTR REINF SKNCLS WHT STRL LF (WOUND CARE SUPPLY) IMPLANT
SUTURE 1 CT1 PROLENE 30IN BLU MONOF NONAB (SUTURE/WOUND CLOSURE) ×4 IMPLANT
SUTURE 2-0 CT2 PROLENE 30IN BL U MONOF NONAB (SUTURE/WOUND CLOSURE) ×2
SUTURE 2-0 CT2 PROLENE 30IN BLU MONOF NONAB (SUTURE/WOUND CLOSURE) ×2 IMPLANT
SWAB BD BBL BD CLTSWB + AMIES GEL MED STRL CULT LF  DISP (Cautery Accessories) ×1 IMPLANT
SWAB BD BBL BD CLTSWB + POLYUR FOAM LQ AMIES MED COLLECT (Cautery Accessories) ×1
SYRINGE LL 10ML LF  STRL GRAD N-PYRG DEHP-FR PVC FREE MED DISP (NEEDLES & SYRINGE SUPPLIES) ×1 IMPLANT
SYRINGE LL 10ML LF STRL MED D ISP (NEEDLES & SYRINGE SUPPLIES) ×1
SYRINGE LL 10ML LF STRL MED D_ISP (NEEDLES & SYRINGE SUPPLIES) ×1
SYRINGE LL 20ML LTX STRL MED (NEEDLES & SYRINGE SUPPLIES) ×1

## 2020-04-22 NOTE — Nurses Notes (Signed)
Patient given prn pain medication for c/o pain. Dressing to right hip dry and intact no drainage noted. Cubicin given wit no side effects. SCDs applied to legs and running. No other needs noted. Call light in reach, fall risk management maintained.

## 2020-04-22 NOTE — OR Surgeon (Signed)
[facility]  Orthopaedic Surgery  Operative Report    Name: [name]  Date of Service: 04/22/2020       Pre-Op Diagnosis:  Septic arthritis right hip  Post-Op Diagnosis:  Same    Procedure:  Right hip arthrotomy irrigation debridement of septic arthritis right hip    Surgeon: Glennon Hamilton, MD     Assist: Rodell Perna PA-C.   Attestation for Assistant:  Assistant required for help with exposure.  Limb manipulation.  Irrigation debridement of the septic hip joint wound closure    Anesthesia:  General endotracheal    Estimated Blood Loss:  50 cc    Fluids:  500 cc    Drains:  None    Tourniquet Time:  None    Complications:  None    Specimens:  Fluid from the right hip joint sent for Gram stain cultures and sensitivity fungal AFB    Findings:  Purulent fluid right hip joint    Brief Pre-Operative History and Indications:  43 old white male who was admitted to the hospital now somewhere around 4 5 days ago complaining of severe pain in his right leg.  His pain was in the groin initially with swelling and even some sensation of a discoloration.  This subsequently resolved.  The patient complained of severe pain in the thigh with radiation all the way down into the foot.  Initially this was felt to be radiculopathy.  Neurosurgery was consulted.  MRI scan of the spine demonstrated some abnormalities but nothing that isolated to the right leg.  I was subsequently consulted the patient had extremely irritable hip on examination.  His plain radiographs were unremarkable.  There was no history of any significant trauma.  Based on my clinical suspicion I felt that the patient a septic arthritis in the hip an MRI was was performed which demonstrated significant fluid in the right hip joint as well as in the surrounding hip musculature this was consistent with septic arthritis.  The patient has a history of being HIV positive.  He had denies any recent cuts or scratches.  The patient  was febrile on his admission to the hospital this quickly abated however.  He complains only of pain.  After I saw the patient obtain a sed rate C-reactive protein both were extremely high.  Our current plan is to proceed with an open arthrotomy of the right hip joint irrigation debridement of his presumed septic arthritis.  Risks benefits options postoperative course discussed in preop informed consent was obtained.  Risks discussed include but are not limited to infection requiring further surgical treatment.  Anesthetic complications neurologic or vascular injury.  Chronic hip pain development of post infectious arthritis in the right hip which could necessitate surgical procedures including even hip replacement surgery.  We discussed the possibility of postoperative hip instability.  Potential medical risks surgery including not limited PE DVT stroke mi the risk of death discussed.  The patient and family well-informed was proceed once again preop informed consent was obtained.  Understood the risks discussed were not inclusive as well.    Operative Text:  The patient and procedure the general tracheal route.  The patient was placed on the OR table the left lateral decubitus position.  Patient was position on a pegboard and also was taped appropriately table.  All bony problems were properly padded.  Special care was taken to assure the cervical spine was lying thoracic spine.  The right  hip girdle right lower extremity was now sterilely prepped and draped.  A mini incision posterior approach to the right hip was utilized.  Incision was carried through skin and subcutaneous tissue.  Hemostasis was now was assured.  The iliotibial band and gluteus maximus flies fascia was divided and the gluteus maximus muscle was split in direction of its fibers.  Charnley self-retaining retractor was placed.  The gluteus medius tendon was retracted.  A failure piriformis the other short external rotators were divided  electrocautery.  A standard T-shaped posterior capsulotomy performed.  Upon performing the capsulotomy a large amount of purulent fluid was obtained from the hip joint.  Specimens were taken and sent to the lab for identification evaluation.  Culture sensitivity fungal AFB were sent.  We then evacuated all the purulent material in the hip joint.  We irrigated the hip joint copiously with normal saline solution with a Pulsavac system.  Total of 6 L saline was utilized irrigation process.  The hip capsule was left open.  We closed the iliotibial band was max fascia with a running 1. Prolene suture.  Subcutaneous closure consisted of interrupted 2-0 Prolene sutures placed in a fashion skin pressure staples a dry sterile compressive bandage applied.  Patient was placed supine there table.  He tolerated procedure well was extubated the OR transferred recovery in stable postoperative.  The patient's wife was company over the case discussed with her in detail      Glennon Hamilton, MD

## 2020-04-22 NOTE — Nurses Notes (Signed)
Patient asleep, woke easily, prn pain medication given pain 7/10. Informed patient again at midnight he will be NPO. Patient stated that he knew and will not drink or eat anything after midnight. Right leg and knee tender to touch. IV patent. No other needs noted.

## 2020-04-22 NOTE — Nurses Notes (Signed)
Pre op check list done, consent signed and in chart, chlorhexidine bath done and linens changed.

## 2020-04-22 NOTE — Consults (Incomplete)
[facility]  Infectious Disease Initial Consult      [name] [name], 75 y.o. 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   Diagnosed pseudocyst,follows at the [place] in [place]  US Scrotum: unremarkable  MRI right hip: moderate size hip joint effusion with diffuse edema, concerning for right hip septic arthritis with reactive versus infectious myositis throughout the right hip musculature  MRI lumbar: Lumbosacral spondylosis, mild left L4-L5 and left L5-S1 neural foraminal stenosis  Peripheral venous duplex: negative for DVT  UA: 51-100 WBC  Urine culture >100 MSSA  Ortho consulted, Harter: open I&D of the right hip via posterior approach 3/23  Neurosurgery consulted, Gold: no neurosurgical intervention needed  One dose of rocephin  Currently bactrim day 3  Continue to monitor       HPI/Discussion:  [name] [name] is a 75 y.o., White male with a past medical history significant for hypertension, hyperlipidemia, hypothyroidism, HIV, and GERD who presented to the emergency department with right groin pain that has progressively worsened since 3/21. The patient seems to have pain in his hip groin that radiates all the way down his leg to his foot.  He denies any associated neurological symptoms.  His pain is severe he is really unable to move his right leg or his right hip. States that the pain initially improved with ice/heat/and  Ben-Gay,but progressively worsened over the past week. Does follow with a pain clinic in [place] where he received steroid injections for uncontrolled back pain. Patient states she had a low-grade fever upon his 1st admission to the hospital. He has been seen by Neurosurgery due to this sounding like radicular type situation.  There is nothing on his MRI scan correlates to a right lumbar radiculopathy.  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 Patient also reports night sweats and urinary urgency over the past 2-3 weeks. Denies hematuria or suprapubic pain. Denies subjective fever or chills, nausea, vomiting, or diarrhea      Past Medical History:   Diagnosis Date   . Esophageal reflux    . High cholesterol    . HTN (hypertension)    . Hypothyroidism          Past Surgical History:   Procedure Laterality Date   . COLON SURGERY     . HX HERNIA REPAIR     . WRIST SURGERY Right          Family History:     Family Medical History:    None          No Known Allergies  acetaminophen (TYLENOL) tablet, 500 mg, Oral, Q4H PRN  amLODIPine (NORVASC) tablet, 5 mg, Oral, Daily  atorvastatin (LIPITOR) tablet, 20 mg, Oral, QPM  diphenhydrAMINE (BENADRYL) capsule, 25 mg, Oral, HS PRN  docusate sodium (COLACE) capsule, 100 mg, Oral, 2x/day  elvitegravir-cobicistat-emtricitrabine-tenofovir (GENVOYA) 150-150-200-10 mg per tablet, 1 Tablet,  Oral, Daily  enoxaparin PF (LOVENOX) 40 mg/0.4 mL SubQ injection, 40 mg, Subcutaneous, Q24H  gabapentin (NEURONTIN) capsule, 300 mg, Oral, 3x/day  HYDROcodone-acetaminophen (NORCO) 5-325 mg per tablet, 1 Tablet, Oral, Q4H PRN  iopamidol (ISOVUE-370) 76% infusion, 50 mL, Intravenous, Give in Radiology  levothyroxine (SYNTHROID) tablet, 88 mcg, Oral, QAM  magnesium hydroxide (MILK OF MAGNESIA) 400mg  per 70mL oral liquid, 15 mL, Oral, Daily PRN  melatonin tablet, 10.5 mg, Oral, NIGHTLY  morphine 4 mg/mL injection, 4 mg, Intravenous, Q4H PRN  NS flush syringe, 3 mL,  Intracatheter, Q8HRS  NS flush syringe, 3 mL, Intracatheter, Q1H PRN  NS flush syringe, 3 mL, Intracatheter, Q8HRS  NS flush syringe, 3 mL, Intracatheter, Q1H PRN  ondansetron (ZOFRAN) 2 mg/mL injection, 4 mg, Intravenous, Q6H PRN  trimethoprim-sulfamethoxazole (BACTRIM DS) 160-800mg  per tablet, 1 Tablet, Oral, 2x/day          Social History:  Social History     Socioeconomic History   . Marital status: Widowed     Spouse name: Not on file   . Number of children: Not on file   . Years of education: Not on file   . Highest education level: Not on file   Occupational History   . Not on file   Tobacco Use   . Smoking status: Never Smoker   . Smokeless tobacco: Never Used   Substance and Sexual Activity   . Alcohol use: Not Currently   . Drug use: Not on file   . Sexual activity: Not on file   Other Topics Concern   . Calcium intake adequate Not Asked   . Computer Use Not Asked   . Drives Not Asked   . Exercise Concern Not Asked   . Helmet Use Not Asked   . Seat Belt Not Asked   . Uses Cane Not Asked   . Uses walker Not Asked   . Uses wheelchair Not Asked   . Right hand dominant Not Asked   . Left hand dominant Not Asked   . Ambidextrous Not Asked   . Uses Scooter Not Asked   . Activity Limitations Not Asked   Social History Narrative   . Not on file     Social Determinants of Health     Financial Resource Strain: Not on file   Food Insecurity: Not on file   Transportation Needs: Not on file   Physical Activity: Not on file   Stress: Not on file   Intimate Partner Violence: Not on file   Housing Stability: Not on file        ROS:   14 point review of systems was performed, positive and negatives as above else otherwise negative    EXAM:  BP 122/83   Pulse 78   Temp 36.6 C (97.9 F)   Resp 18   Ht 1.778 m (5\' 10" )   Wt 79.4 kg (175 lb)   SpO2 92%   BMI 25.11 kg/m       General:   75 y.o. male appearing stated age.  Well appearing.  No acute distress.  Head:  Normocephalic and atraumatic.  ENT:  Membranes are  moist.  Oropharynx free of erythema, exudates and thrush.  Neck:  Supple with full range of motion trachea is midline. No lymphadenopathy.  Respiratory:  Clear to auscultation bilaterally. No wheezes, rales or rhonchi.  Cardiovascular:  Regular rate and rhythm. No murmurs, rubs or gallops.    Abdomen:  Soft, nontender and nondistended.  Bowel  sounds x4.    Extremities:  2+ pedal and radial pulses palpated.  No clubbing, cyanosis or edema.   Musculoskeletal :  No rash        Labs:    Recent Labs     04/20/20  0622 04/21/20  0627 04/22/20  0426   WBC 12.0* 12.1* 11.0   HGB 12.6* 12.3* 12.9*   HCT 36.6* 36.0* 38.5*   PLTCNT 162 198 235     Recent Labs     04/20/20  0622 04/21/20  0627 04/22/20  0426   PMNS 82 82 82   MONOCYTES 8 7 7    BASOPHILS 0  <0.10 0  <0.10 0  <0.10   PMNABS 9.84* 9.88* 8.99*   LYMPHSABS 1.10 1.12 1.02   MONOSABS 0.90 0.87 0.72   EOSABS <0.10 <0.10 <0.10     Recent Labs     03/24/20  1051 03/24/20  1536 03/25/20  0443 04/19/20  0933 04/19/20  1001 04/20/20  0525 04/21/20  0627 04/22/20  0426   NA 133*  --    < > 135*  --  134* 132* 133*   K 4.8  --    < > 4.5  --  4.7 4.6 5.0   KET  --  Not Detected  --   --  Not Detected  --   --   --    CL 103  --    < > 97  --  102 98 98   CO2 22*  --    < > 25  --  21* 25 23   BUN 43*  --    < > 18  --  23 21 21    CREA 1.62*  --    < > 1.23  --  1.10 0.98 1.09   ALKP 88  --   --  100  --   --   --   --    SGOT 22  --   --  13  --   --   --   --     < > = values in this interval not displayed.     Recent Labs     04/20/20  0525 04/21/20  0627 04/22/20  0426   CALCIUM 8.2* 8.6* 9.1   MAGNESIUM 2.4 2.4 2.3     No results for input(s): TOTALPROTEIN, ALBUMIN, PREALBUMIN, AST, ALT, ALKPHOS, LDH, AMYLASE, LIPASE in the last 72 hours.    Microbiology:   Hospital Encounter on 04/19/20 (from the past 96 hour(s))   URINE CULTURE    Collection Time: 04/19/20 10:01 AM    Specimen: Urine, Site not specified   Culture Result Status    URINE CULTURE >100000 Staphylococcus  aureus (A) Final       Susceptibility    Staphylococcus aureus -  (no method available)     Penicillin 0.06 Resistant      Oxacillin <=0.25 Sensitive      Gentamicin <=0.5 Sensitive      Ciprofloxacin >=8 Resistant      Levofloxacin 4 Resistant      Inducible Clindamycin Resistance Neg       Synercid <=0.25 Sensitive      Linezolid 2 Sensitive      Vancomycin 1 Sensitive      Tetracycline <=1 Sensitive      Nitrofurantoin <=16 Sensitive      Rifampin <=0.5 Sensitive      Trimethoprim/Sulfamethoxazole <=10 Sensitive  Imaging Studies:   Results for orders placed or performed during the hospital encounter of 04/19/20   XR HIP RIGHT W PELVIS 2-3 VIEWS     Status: None    Narrative    Male, 75 years old.    XR HIP RIGHT W PELVIS 2-3 VIEWS performed on 04/19/2020 10:16 AM.    REASON FOR EXAM:  Right hip/ groin pain    FINDINGS: 2 views right hip are submitted for interpretation.      Impression    1. Patient is osteopenic.  2. There are mild degenerative changes without displaced fracture or dislocation.  3. Visualized bowel pattern shows a moderate to large amount of stool.      Radiologist location ID: [id number]     CT ABDOMEN PELVIS W IV CONTRAST     Status: Abnormal    Narrative    Male, 75 years old.    CT ABDOMEN PELVIS W IV CONTRAST performed on 04/19/2020 12:46 PM.    REASON FOR EXAM:  Right groin pain    CT Dose:  1465 DLP (mGy*cm)  This CT scanner is equipped with dose reducing technology. The mAs is automatically adjusted to patient's body size in order to deliver the lowest dose possible.    CONTRAST: 100 ml's of Isovue 370    Findings: Axial postcontrast CT imaging of the abdomen and pelvis was performed along with sagittal and coronal reformats. No prior studies are available for direct comparison. The included lung bases show some mild consolidative atelectasis and likely trace pleural effusions.    There is a 5.5 x 6 x 5 cm multiloculated heterogeneous mass involving the tail the pancreas. There are  further couple small cystic areas noted at the head of the pancreas. There is no evidence of surrounding inflammation or fluid. There is no peripancreatic adenopathy.    The liver shows a few simple appearing cysts. However there are also couple small hypodensities that are too small to further characterize. The gallbladder is mildly distended but no stone, sludge, wall thickening or surrounding inflammation is detected. There is no filling defect within the common duct which is smoothly marginated.    The spleen, adrenal glands and kidneys show no acute process. Small cysts are seen at each kidney. The delayed sequence shows prompt excretion of contrast material bilaterally.    The small and large bowel show no obstructive process. There is a moderate amount of stool throughout the colon. No focal bowel wall thickening or surrounding inflammation is detected. There is no free fluid or air. Urinary bladder is distended symmetrically. Patient is status post prostatectomy. There is no free fluid or air. Patient does appear to have a right femoral hernia with fat extending along the common femoral vein and artery. There is a small fat-containing left inguinal hernia. Bony structures are intact.      Impression    1. 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 GI consult would be recommended for potential scope/ERCP and washings. No definite evidence to suggest distal metastasis is seen.  2. There are some hypodensities involving the liver with the larger likely representing cysts although there are scattered smaller areas that are too small to further characterize.  3. There is fatty density along the right femoral vascular structures likely representing a small femoral hernia. Patient also has a fat-containing  left inguinal  hernia which is small.  4. Cystic changes are noted at the kidneys.  5. Mild bibasilar atelectasis and cardiomegaly are noted.      Radiologist location ID: [id number]     CT FEMUR / THIGH RIGHT W IV CONTRAST     Status: None    Narrative    Male, 75 years old.    CT FEMUR / THIGH RIGHT W IV CONTRAST performed on 04/19/2020 5:11 PM.    REASON FOR EXAM:  Intractable thigh/ leg pain  RADIATION DOSE: 498 DLP  CONTRAST: 50 ml's of Isovue 370    TECHNIQUE: Intravenous contrast utilized for study. Volumetric acquisition of the right femur/thigh. This CT scanner is equipped with dose reducing technology. The exposure is automatically adjusted according to patient body size in order to deliver the lowest dose possible.    COMPARISON: None available.    FINDINGS: No acute fracture. If the areas of calcified plaque involving the right common femoral artery. Otherwise, the imaged right common and superficial femoral arteries are patent, as is the visualized right popliteal artery. There is nonspecific subcutaneous edema about the knee and the visualized right lower leg. No acute abnormalities within the included pelvic organs.      Impression    1.Nonspecific subcutaneous edema/stranding about the right knee and visualized right lower leg.  2.Right common femoral, superficial femoral, and visualized popliteal arteries are patent.            Radiologist location ID: [id number]     US SCROTUM     Status: None    Narrative    Male, 75 years old.    US SCROTUM performed on 04/19/2020 3:03 PM.    REASON FOR EXAM:  Right testicular pain    FINDINGS: Ultrasound evaluation of the scrotum/testicles was performed using grayscale and color Doppler imaging the bilateral testicles are within normal limits size configuration. No solid or cystic testicular lesion is seen. Blood flow is documented to each testicle. There was no hydrocele or varicocele seen on either side. Epididymal regions are unremarkable.      Impression    Unremarkable  ultrasound of the scrotum/testicles.      Radiologist location ID: [id number]     MRI SPINE LUMBOSACRAL W/WO CONTRAST     Status: None    Narrative    Male, 75 years old.    MRI SPINE LUMBOSACRAL W/WO CONTRAST performed on 04/20/2020 1:57 PM.    REASON FOR EXAM:  Right Sciatica    INTRAVENOUS CONTRAST: 13 ml's of Multihance    TECHNIQUE: Multisequence multiplanar MR lumbosacral spine without and with gadolinium.    COMPARISON: MRI lumbosacral spine of 11/29/2018.    FINDINGS: Preserved lumbar lordosis. No acute fracture or traumatic malalignment. Cord and conus are normal in signal and position.    T12-L1: No high-grade spinal canal or neural foraminal stenosis.    L1-L2: Right paracentral disc protrusion. No spinal canal or neural foraminal stenosis.    L2-L3: No spinal canal or neural foraminal stenosis.    L3-L4: Posterior disc bulge. No spinal canal or neural foraminal stenosis.    L4-L5: Posterior disc bulge. Flattening of lateral recesses. No spinal canal stenosis. Mild left neural foraminal stenosis.    L5-S1: Mild posterior disc bulge. No spinal canal stenosis. Mild left neural foraminal stenosis.    Included superior joints and straight degenerative changes.      Impression    1.Lumbosacral spondylosis without spinal canal stenosis.  2.Mild left L4-L5 and left  L5-S1 neural foraminal stenosis.      Radiologist location ID: [id number]     MRI HIP RIGHT WO CONTRAST     Status: None    Narrative    Male, 75 years old.    MRI HIP RIGHT WO CONTRAST performed on 04/21/2020 2:58 PM.    REASON FOR EXAM:  SEVERE HIP PAIN, IRRITABLE HIP ON EXAM , LOW GRADE FEVER    TECHNIQUE: Multisequence multiplanar MRI right hip without gadolinium.    COMPARISON: Right femur/thigh CT of 04/19/2020.    FINDINGS:  Moderate size right hip joint effusion. Pericapsular edema throughout the right hip soft tissues. Diffuse nonspecific edema like signal involving the right adductor muscles as well as the right gluteal, hamstrings, and  quadriceps muscle groups about the right hip. Fascial edema extends to the superficial fascia. Subcutaneous edema involving the right lateral hip.    No acute fracture. No femoral head AVN. No infiltrating abnormal marrow signal. Subchondral reactive marrow signal changes involving the weightbearing aspect of the right acetabulum, likely degenerative in etiology. Mild nonspecific edema like signal involving the right peritrochanteric soft tissues.      Impression    Moderate size right hip joint effusion with diffuse edema like signal throughout the right hip musculature including the adductor, gluteal, quadriceps, and hamstrings groups. Findings concerning for right hip septic arthritis with reactive versus infectious myositis throughout the right hip musculature.      Radiologist location ID: [place], [place]

## 2020-04-22 NOTE — Anesthesia Postprocedure Evaluation (Signed)
Anesthesia Post Op Evaluation    Patient: [name]  Procedure(s):  IRRIGATION AND DEBRIDEMENT HIP    Last Vitals:Temperature: 36.5 C (97.7 F) (04/22/20 1602)  Heart Rate: 84 (04/22/20 1602)  BP (Non-Invasive): (!) 144/78 (04/22/20 1602)  Respiratory Rate: 18 (04/22/20 1602)  SpO2: 100 % (04/22/20 6712)    No complications documented.      Patient location during evaluation: PACU       Patient participation: complete - patient participated  Level of consciousness: awake and alert and responsive to verbal stimuli    Pain management: adequate  Airway patency: patent    Anesthetic complications: no  Cardiovascular status: acceptable  Respiratory status: acceptable  Hydration status: acceptable  Patient post-procedure temperature: Pt Normothermic   PONV Status: Absent

## 2020-04-22 NOTE — Progress Notes (Addendum)
[facility]  HOSPITALIST PROGRESS NOTE      [name]                     75 y.o. male 415/A    Date of service: 04/22/2020      Date of Admission:  04/19/2020   Code Status: Full Code Inpatient     HPI:    [name] is a 75 y.o. male with PMH significant for hypertension, hyperlipidemia, hypothyroidism, HIV, and GERD who presented to the emergency department with right groin pain that has progressively worsened since Monday.  Patient states that last Saturday (8 days ago), he picked up a case of soda and tea and noticed that he pulled something in his right groin but he was able to continue with his ALDs without much troubles but on Monday after playing golf and helping his mother-in-law get in & out of her wheelchair, he started having more sever pain.  States that the pain initially improved with ice/heat/and Ben-Gay, but progressively worsened over the past week.  Patient states that he had difficulty sleeping due to the pain and was up at 3:00 a.m. and severe pain which prompted him to go to the emergency department.  Patient with significant right inguinal pain that is worse with palpation.  States that pain radiates to his right testicle, right lateral hip, anterior thigh, and medial calf.  States lifting his right leg up is extremely painful and he is having difficulty adjusting himself in bed due to the pain as well.     Pt has been going to a pain clinic associated with an outside facility [facility]), there he reports that he had been getting steroid injections and recently was referred to another pain manage,ent office for uncontrolled back pain.     Patient also reports night sweats and urinary urgency over the past 2-3 weeks.  Denies hematuria or suprapubic pain.  Denies subjective fever or chills, nausea, vomiting, or diarrhea. ROS also negative for chest pain or pressure, shortness of breath, or recent illness.    In ED, CT the abdomen pelvis  with IV contrast revealed fatty density along the right femoral vascular structures likely representing a small femoral hernia.  Also has a fat containing left inguinal hernia which is small.     Of note, CT also reveals pancreatic mass that was also identified on CTA of the chest 03/24/2020.  I spoke with the radiologist who compared today's images from images last admission and states there has been no change since February.  Patient was also admitted in February for chest pain. 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 Our images were reviewed with our radiologist in comparison to the report obtained from [facility] from 2017, and no intervention needed at this time.  A fine needle aspiration 2017 revealed benign etiology.  It is [facility] of this area        WBC 12.4, hemoglobin 15, CMP unremarkable, CK not elevated, UA positive for UTI        Subjective / Interval history:  Patient's symptoms are clinically is consistent with right radiculopathy.  MRI of the lumbar spine was performed and Neurosurgery was consulted.  However as per Neurosurgery patient's symptoms are not related to radiculopathy.  I will obtain consultation with Orthopedic to see  if they can figure out where the pain is coming from.  I will discontinue Dilaudid and start morphine p.r.n. for pain will continue Norco as before.    04/21/2020----patient was seen by Orthopedic Dr. Parke Simmers .  MRI of the hip and thigh was performed.    MRI demonstrates significant fluid within the hip joint and the surrounding musculature consistent with septic arthritis of the hip joint this also is consistent with his clinical examination and has significant elevation of sed rate and CRP.  Orthopedics plan to do open irrigation and debridement of the septic hip joint tomorrow.     04/22/2020----patient underwent  open irrigation and debridement of the septic hip joint today patient found to have purulent fluid from the joint which was sent for Gram staining and cultures.  Will start patient on daptomycin and cefepime and follow up on cultures.  Infectious Disease on board.    DENIES: chest pain, SOB, cough/sputum, fever/chills, abdo pain, nausea/ vomiting/ diarrhea/ constipation.    PHYSICAL EXAM:  VITALS: BP (!) 144/78    Pulse 84    Temp 36.5 C (97.7 F)    Resp 18    Ht 1.778 m (5\' 10" )    Wt 79.4 kg (175 lb)    SpO2 100%    BMI 25.11 kg/m         GENERAL:   Pt is a pleasant, well-nourished, well-developed 75 y.o. male who is resting comfortably in bed in NAD. Appears stated age  32:  head normocephalic, symmetrical facies. EOM intact b/l. Sclera non-icteric, non-injected. Oropharyngeal mucous membranes are moist w/o erythema/exudates   CV: RRR. No murmurs. No BLE edema. 2+ pulses present b/l.   LUNGS: CTAB, No rhonchi, rales, wheezes.   BREAST: deferred   RECTAL: deferred  GI: (+) BS in all 4 quadrants. soft, NT/ ND. No rigidity/ guarding/ rebound.  GU: deferred  MSK: full spontaneous ROM of all 4 extremities. Gait not assessed at this time.  SKIN: No significant lesions, rashes, ecchymoses noted.   NEURO: AAOx4, CN grossly intact. Sensation intact b/l. No focal deficits.  PSYCH: Mood, behavior and affect normal w/ Intact judgement & insight         Assessment/Plan:  1. Right groin pain 2nd to  septic hip joint:  as mentioned above, patient was taken to OR today for open irrigation and debridement of the right hip joint.  Purulent fluid were obtained and sent for Gram staining in cultures.  As mentioned above, we will start daptomycin and cefepime and follow up on cultures.  Infectious Disease on board..  2.   3. As mentioned above MRI of the lumbar spine was also performed neurosurgery was also consulted.  Patient pain is not from radiculopathy as per Neurosurgery.  Orthopedic will be consulted.  Will continue  Norco as before I will discontinue Dilaudid and start morphine p.r.n. for pain  4.   5. UTI:  Have initiated Rocephin.  Urine culture pending.  6. Essential hypertension:  Blood pressures are stable.  Continue patient's home dose of Norvasc  7. Hyperlipidemia:  Continue statin  8. Hypothyroidism:  Continue Synthroid  9. HIV positive:  Continue patient's home antiviral medications.  10. History of prostate cancer:  Follows with the [place].  11. 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  [facility].  12. Sepsis on admission---resolved with IV fluids and IV antibiotics      DVT prophylaxis:  Lovenox            ANCILLARY DATA:  Results for orders placed or performed during the hospital encounter of 04/19/20 (from the past 24 hour(s))   Conception, AEROBIC    Specimen: Right Hip; Other   Result Value Ref Range    GRAM STAIN 1+ Rare PMNs     GRAM STAIN No Organisms Seen    STERILE SITE CULTURE AND GRAM STAIN, AEROBIC    Specimen: Right Hip; Body fluid   Result Value Ref Range    GRAM STAIN 1+ Rare PMNs     GRAM STAIN No Organisms Seen    CBC/DIFF    Narrative    The following orders were created for panel order CBC/DIFF.  Procedure                               Abnormality         Status                     ---------                               -----------         ------                     CBC WITH YIRS[[id number]]                Abnormal            Final result                 Please view results for these tests on the individual orders.   MAGNESIUM   Result Value Ref Range    MAGNESIUM 2.3 1.8 - 2.6 mg/dL   BASIC METABOLIC PANEL   Result Value Ref Range    SODIUM 133 (L) 136 - 145 mmol/L    POTASSIUM 5.0 3.5 - 5.1 mmol/L    CHLORIDE 98 96 - 111 mmol/L    CO2 TOTAL 23 23 - 31 mmol/L    ANION GAP 12 4 - 13 mmol/L    CALCIUM 9.1 8.8 - 10.2 mg/dL    GLUCOSE 122 65 - 125 mg/dL    BUN 21 8 - 25 mg/dL    CREATININE 1.09 0.75  - 1.35 mg/dL    BUN/CREA RATIO 19 6 - 22    ESTIMATED GFR 67 >=60 mL/min/BSA   CBC WITH DIFF   Result Value Ref Range    WBC 11.0 3.7 - 11.0 x103/uL    RBC 4.09 (L) 4.50 - 6.10 x106/uL    HGB 12.9 (L) 13.4 - 17.5 g/dL    HCT 38.5 (L) 38.9 - 52.0 %    MCV 94.1 78.0 - 100.0 fL    MCH 31.5 26.0 - 32.0 pg    MCHC 33.5 31.0 - 35.5 g/dL    RDW-CV 14.1 11.5 - 15.5 %    PLATELETS 235 150 - 400 x103/uL    MPV 10.0 8.7 - 12.5 fL    NEUTROPHIL % 82 %    LYMPHOCYTE % 9 %    MONOCYTE % 7 %    EOSINOPHIL % 0 %    BASOPHIL % 0 %    NEUTROPHIL # 8.99 (H) 1.50 - 7.70 x103/uL    LYMPHOCYTE # 1.02  1.00 - 4.80 x103/uL    MONOCYTE # 0.72 0.20 - 1.10 x103/uL    EOSINOPHIL # <0.10 <=0.50 x103/uL    BASOPHIL # <0.10 <=0.20 x103/uL    IMMATURE GRANULOCYTE % 2 (H) 0 - 1 %    IMMATURE GRANULOCYTE # 0.21 (H) <0.10 x103/uL   BODY FLUID CELL COUNT   Result Value Ref Range    COLOR Red     CLARITY Cloudy     WBC FLUID 500 (H) 0 - 5 cells/cubic mm    RBC FLUID 684,000 (H) 0 - 5 cells/cubic mm    FLUID SEGS 91 %    FLUID LYMPHS 7 %    FLUID MONOS 2 %    FLUID EOSINOPHIL 0 %    FLUID BASOPHILS 0 %   CREATINE KINASE (CK), TOTAL, SERUM - TODAY & WEEKLY   Result Value Ref Range    CREATINE KINASE 37 (L) 45 - 225 U/L        Diagnostic studies:  Results for orders placed or performed during the hospital encounter of 04/19/20 (from the past 72 hour(s))   MRI SPINE LUMBOSACRAL W/WO CONTRAST     Status: None    Narrative    Male, 75 years old.    MRI SPINE LUMBOSACRAL W/WO CONTRAST performed on 04/20/2020 1:57 PM.    REASON FOR EXAM:  Right Sciatica    INTRAVENOUS CONTRAST: 13 ml's of Multihance    TECHNIQUE: Multisequence multiplanar MR lumbosacral spine without and with gadolinium.    COMPARISON: MRI lumbosacral spine of 11/29/2018.    FINDINGS: Preserved lumbar lordosis. No acute fracture or traumatic malalignment. Cord and conus are normal in signal and position.    T12-L1: No high-grade spinal canal or neural foraminal stenosis.    L1-L2: Right  paracentral disc protrusion. No spinal canal or neural foraminal stenosis.    L2-L3: No spinal canal or neural foraminal stenosis.    L3-L4: Posterior disc bulge. No spinal canal or neural foraminal stenosis.    L4-L5: Posterior disc bulge. Flattening of lateral recesses. No spinal canal stenosis. Mild left neural foraminal stenosis.    L5-S1: Mild posterior disc bulge. No spinal canal stenosis. Mild left neural foraminal stenosis.    Included superior joints and straight degenerative changes.      Impression    1.Lumbosacral spondylosis without spinal canal stenosis.  2.Mild left L4-L5 and left L5-S1 neural foraminal stenosis.      Radiologist location ID: [id number]     MRI HIP RIGHT WO CONTRAST     Status: None    Narrative    Male, 75 years old.    MRI HIP RIGHT WO CONTRAST performed on 04/21/2020 2:58 PM.    REASON FOR EXAM:  SEVERE HIP PAIN, IRRITABLE HIP ON EXAM , LOW GRADE FEVER    TECHNIQUE: Multisequence multiplanar MRI right hip without gadolinium.    COMPARISON: Right femur/thigh CT of 04/19/2020.    FINDINGS:  Moderate size right hip joint effusion. Pericapsular edema throughout the right hip soft tissues. Diffuse nonspecific edema like signal involving the right adductor muscles as well as the right gluteal, hamstrings, and quadriceps muscle groups about the right hip. Fascial edema extends to the superficial fascia. Subcutaneous edema involving the right lateral hip.    No acute fracture. No femoral head AVN. No infiltrating abnormal marrow signal. Subchondral reactive marrow signal changes involving the weightbearing aspect of the right acetabulum, likely degenerative in etiology. Mild nonspecific edema like signal involving the right peritrochanteric soft  tissues.      Impression    Moderate size right hip joint effusion with diffuse edema like signal throughout the right hip musculature including the adductor, gluteal, quadriceps, and hamstrings groups. Findings concerning for right hip septic  arthritis with reactive versus infectious myositis throughout the right hip musculature.      Radiologist location ID: [id number]         Inpatient Medications:  acetaminophen (TYLENOL) tablet, 500 mg, Oral, Q4H PRN  amLODIPine (NORVASC) tablet, 5 mg, Oral, Daily  atorvastatin (LIPITOR) tablet, 20 mg, Oral, QPM  ceFEPime (MAXIPIME) 2g in NS 170mL IVPB minibag, 2 g, Intravenous, Q12H  DAPTOmycin (CUBICIN) 500 mg in NS 50 mL IVPB, 6 mg/kg, Intravenous, Q24H  diphenhydrAMINE (BENADRYL) capsule, 25 mg, Oral, HS PRN  docusate sodium (COLACE) capsule, 100 mg, Oral, 2x/day  elvitegravir-cobicistat-emtricitrabine-tenofovir (GENVOYA) 150-150-200-10 mg per tablet, 1 Tablet, Oral, Daily  enoxaparin PF (LOVENOX) 40 mg/0.4 mL SubQ injection, 40 mg, Subcutaneous, Q24H  gabapentin (NEURONTIN) capsule, 300 mg, Oral, 3x/day  HYDROcodone-acetaminophen (NORCO) 5-325 mg per tablet, 1 Tablet, Oral, Q4H PRN  iopamidol (ISOVUE-370) 76% infusion, 50 mL, Intravenous, Give in Radiology  levothyroxine (SYNTHROID) tablet, 88 mcg, Oral, QAM  LR premix infusion, , Intravenous, Continuous  magnesium hydroxide (MILK OF MAGNESIA) 400mg  per 60mL oral liquid, 15 mL, Oral, Daily PRN  melatonin tablet, 10.5 mg, Oral, NIGHTLY  morphine 4 mg/mL injection, 4 mg, Intravenous, Q4H PRN  nalOXone (NARCAN) 1 mg/mL injection, 1 mg, Intravenous, Q2 MIN PRN  NS flush syringe, 3 mL, Intracatheter, Q8HRS  NS flush syringe, 3 mL, Intracatheter, Q1H PRN  NS flush syringe, 3 mL, Intracatheter, Q8HRS  NS flush syringe, 3 mL, Intracatheter, Q1H PRN  NS flush syringe, 3 mL, Intracatheter, Q8HRS  NS flush syringe, 3 mL, Intracatheter, Q1H PRN  ondansetron (ZOFRAN) 2 mg/mL injection, 4 mg, Intravenous, Q6H PRN       ______________________________________________________________________

## 2020-04-22 NOTE — Anesthesia Transfer of Care (Signed)
ANESTHESIA TRANSFER OF CARE   [name] is a 75 y.o. ,male, Weight: 79.4 kg (175 lb)   had Procedure(s):  IRRIGATION AND DEBRIDEMENT HIP  performed  04/22/20   Primary Service: Tia Alert, MD    Past Medical History:   Diagnosis Date   . Esophageal reflux    . High cholesterol    . HTN (hypertension)    . Hypothyroidism       Allergy History as of 04/22/20      No Known Allergies              I completed my transfer of care / handoff to the receiving personnel during which we discussed:  All key/critical aspects of case discussed    Post Location: PACU                                          Additional Info:Report to RN                        Last OR Temp: Temperature: 36.1 C (97 F)  ABG:  POTASSIUM   Date Value Ref Range Status   04/22/2020 5.0 3.5 - 5.1 mmol/L Final     KETONES   Date Value Ref Range Status   04/19/2020 Not Detected Not Detected mg/dL Final     CALCIUM   Date Value Ref Range Status   04/22/2020 9.1 8.8 - 10.2 mg/dL Final     Calculated P Axis   Date Value Ref Range Status   03/24/2020 33 degrees Final     Calculated R Axis   Date Value Ref Range Status   03/24/2020 34 degrees Final     Calculated T Axis   Date Value Ref Range Status   03/24/2020 50 degrees Final     Airway:* No LDAs found *  Blood pressure (!) 146/82, pulse 80, temperature 36.1 C (97 F), resp. rate (!) 8, height 1.778 m (5\' 10" ), weight 79.4 kg (175 lb), SpO2 92 %.

## 2020-04-22 NOTE — Care Plan (Signed)
[facility]  Care Plan Note    Situation: Left hip I&D    Intervention: Decrease pain, improve infection    Response: Patient c/o pain during the shift, PRN pain medication administered. Patient went for I&D of hip, antibiotics ordered. Patient to start therapy tomorrow. Will continue to monitor.      Dannielle Karvonen, RN    Problem: Adult Inpatient Plan of Care  Goal: Plan of Care Review  Outcome: Ongoing (see interventions/notes)  Flowsheets (Taken 04/22/2020 1801)  Plan of Care Reviewed With: patient  Progress: improving  Goal: Patient-Specific Goal (Individualized)  Outcome: Ongoing (see interventions/notes)  Flowsheets (Taken 04/22/2020 1801)  Patient-Specific Goals (Include Timeframe): To keep pain control  Goal: Absence of Hospital-Acquired Illness or Injury  Outcome: Ongoing (see interventions/notes)  Goal: Optimal Comfort and Wellbeing  Outcome: Ongoing (see interventions/notes)  Goal: Rounds/Family Conference  Outcome: Ongoing (see interventions/notes)     Problem: Fall Injury Risk  Goal: Absence of Fall and Fall-Related Injury  Outcome: Ongoing (see interventions/notes)     Problem: Pain Acute  Goal: Acceptable Pain Control and Functional Ability  Outcome: Ongoing (see interventions/notes)     Problem: Fall Injury Risk  Goal: Absence of Fall and Fall-Related Injury  Outcome: Ongoing (see interventions/notes)

## 2020-04-22 NOTE — Progress Notes (Signed)
Orthopedics is identified the etiology of the patient's pain.  It no neurosurgical recommendations at this time.  If new symptoms develop relative to the lumbar spine please re-consult Neurosurgery.

## 2020-04-22 NOTE — Care Plan (Incomplete)
Patient slept for a few hours woke up at 0320 for pain medication. Has been NPO through the night.   Problem: Adult Inpatient Plan of Care  Goal: Plan of Care Review  Outcome: Ongoing (see interventions/notes)  Flowsheets (Taken 04/22/2020 0435)  Plan of Care Reviewed With: patient  Progress: no change  Goal: Patient-Specific Goal (Individualized)  Outcome: Ongoing (see interventions/notes)     Problem: Fall Injury Risk  Goal: Absence of Fall and Fall-Related Injury  Outcome: Ongoing (see interventions/notes)     Problem: Pain Acute  Goal: Acceptable Pain Control and Functional Ability  Outcome: Ongoing (see interventions/notes)     Problem: Fall Injury Risk  Goal: Absence of Fall and Fall-Related Injury  Outcome: Ongoing (see interventions/notes)

## 2020-04-22 NOTE — OR PostOp (Signed)
Philippa Sicks arrived to pacu at  1454   Respirations even and deep. Spontaneous    Skin warm and dry to touch   Lips and nailbeds pink.   Iv infusing well. See flowsheet  Patient connected to PACU monitor.   Cardiac strip reviewed and placed on chart.  Safety measures in place. side rails up, bed in low position , at bedside with patient.  Warm blankets applied to patient for comfort.   Connected to O2 at 2 l nc  Pt awakes to voice and follows commands.   Dressing to right hip dry and intact. Ice pack applied. Pedal pulses palpable. Bilateral scds, added pump.   Pt on house bed.   1500 pt sleeping  1515 pt confused. Of place and time.   1530 Pt awake , Respirations even , deep spontaneous . Iv infusing well. No redness or edema at site.  No c/o nausea.  VS stable preparing for discharge  Skin warm and dry.   Pain tolerable   Assessment repeated and dressing dry and intact. Pt does have times of anxiety. Able to calm pt quietly.   Called report to [facility] TO FLOOR. Called lobby for family Doneen Poisson, RN  04/22/2020, 15:49

## 2020-04-22 NOTE — H&P (Signed)
[facility]     H&P UPDATE FORM                                                                                    Patient: [name] 75 y.o. male  Date of Birth: May 27, 1945    04/22/2020    STOP: IF H&P IS GREATER THAN 30 DAYS FROM SURGICAL DAY COMPLETE NEW H&P IS REQUIRED.     H & P updated the day of the procedure.  1.  H&P completed within 30 days of surgical procedure  and has been reviewed within 24 hours of the surgery, the patient has been examined, and no change has occured in the patients condition since the H&P was completed.       Change in medications: No     Patient's last menstrual period: No LMP for male patient.      Comments:     2.  Patient continues to be appropriate candidate for planned surgical procedure. YES        Glennon Hamilton, MD  Orthopaedic Surgery

## 2020-04-22 NOTE — Nurses Notes (Signed)
pre op check list done, consent signed and in chart, chlorhexidine bath done and linens changed.

## 2020-04-22 NOTE — Nurses Notes (Signed)
Patient laying in bed. A&O x4. Heart reg. Lungs clear. Resp even and unlabored on RA. Belly soft and non tender with active bowel sounds in all quads. Edema noted to right hip, right knee, and scrotum. No c/o n/v. C/O pain in right hip, PRN pain medication administered. IV dressing dry and intact. Patient to go to OR for I&D this morning. Bathroom offered and refused. No other needs voiced at this time. Will continue to monitor.  Dannielle Karvonen, RN  04/22/2020, 09:35

## 2020-04-22 NOTE — Anesthesia Preprocedure Evaluation (Signed)
ANESTHESIA PRE-OP EVALUATION  Planned Procedure: IRRIGATION AND DEBRIDEMENT HIP (Right )  Review of Systems     anesthesia history negative     patient summary reviewed  nursing notes reviewed        Pulmonary  negative pulmonary ROS,    Cardiovascular    Hypertension and ECG reviewed ,No peripheral edema,        GI/Hepatic/Renal    GERD     Endo/Other    hypothyroidism,       Neuro/Psych/MS   negative neuro/psych ROS,      Cancer  negative hematology/oncology ROS,                    Physical Assessment      Airway       Mallampati: I      Neck ROM: full  Mouth Opening: good.            Dental       Dentition intact             Pulmonary    Breath sounds clear to auscultation  (-) no rhonchi, no decreased breath sounds, no wheezes, no rales and no stridor     Cardiovascular    Rhythm: regular  Rate: Normal  (-) no friction rub, carotid bruit is not present, no peripheral edema and no murmur     Other findings            Plan  ASA 2     Planned anesthesia type: general     general anesthesia with endotracheal tube intubation              Intravenous induction     Anesthesia issues/risks discussed are: Dental Injuries, PONV, Sore Throat, Aspiration, Cardiac Events/MI and Stroke.  Anesthetic plan and risks discussed with patient.          Patient's NPO status is appropriate for Anesthesia.           Plan discussed with CRNA.

## 2020-04-23 ENCOUNTER — Inpatient Hospital Stay (HOSPITAL_COMMUNITY): Payer: 59

## 2020-04-23 DIAGNOSIS — I330 Acute and subacute infective endocarditis: Secondary | ICD-10-CM

## 2020-04-23 LAB — BASIC METABOLIC PANEL
ANION GAP: 12 mmol/L (ref 4–13)
BUN/CREA RATIO: 27 — ABNORMAL HIGH (ref 6–22)
BUN: 26 mg/dL — ABNORMAL HIGH (ref 8–25)
CALCIUM: 8.8 mg/dL (ref 8.8–10.2)
CHLORIDE: 99 mmol/L (ref 96–111)
CO2 TOTAL: 21 mmol/L — ABNORMAL LOW (ref 23–31)
CREATININE: 0.96 mg/dL (ref 0.75–1.35)
ESTIMATED GFR: 78 mL/min/BSA (ref >=60)
GLUCOSE: 135 mg/dL — ABNORMAL HIGH (ref 65–125)
POTASSIUM: 4.8 mmol/L (ref 3.5–5.1)
SODIUM: 132 mmol/L — ABNORMAL LOW (ref 136–145)

## 2020-04-23 LAB — CBC WITH DIFF
BASOPHIL #: <0.1 10*3/uL (ref <=0.20)
BASOPHIL %: 0 %
EOSINOPHIL #: <0.1 10*3/uL (ref <=0.50)
EOSINOPHIL %: 0 %
HCT: 35.5 % — ABNORMAL LOW (ref 38.9–52.0)
HGB: 11.5 g/dL — ABNORMAL LOW (ref 13.4–17.5)
IMMATURE GRANULOCYTE #: 0.27 10*3/uL — ABNORMAL HIGH (ref <0.10)
IMMATURE GRANULOCYTE %: 2 % — ABNORMAL HIGH (ref 0–1)
LYMPHOCYTE #: 0.76 10*3/uL — ABNORMAL LOW (ref 1.00–4.80)
LYMPHOCYTE %: 6 %
MCH: 31 pg (ref 26.0–32.0)
MCHC: 32.4 g/dL (ref 31.0–35.5)
MCV: 95.7 fL (ref 78.0–100.0)
MONOCYTE #: 0.49 10*3/uL (ref 0.20–1.10)
MONOCYTE %: 4 %
MPV: 10.2 fL (ref 8.7–12.5)
NEUTROPHIL #: 10.46 10*3/uL — ABNORMAL HIGH (ref 1.50–7.70)
NEUTROPHIL %: 88 %
PLATELETS: 252 10*3/uL (ref 150–400)
RBC: 3.71 10*6/uL — ABNORMAL LOW (ref 4.50–6.10)
RDW-CV: 14.1 % (ref 11.5–15.5)
WBC: 12 10*3/uL — ABNORMAL HIGH (ref 3.7–11.0)

## 2020-04-23 LAB — MAGNESIUM: MAGNESIUM: 2.1 mg/dL (ref 1.8–2.6)

## 2020-04-23 NOTE — Progress Notes (Addendum)
[facility]  HOSPITALIST PROGRESS NOTE      BANDON SHERWIN                     75 y.o. male 415/A    Date of service: 04/23/2020      Date of Admission:  04/19/2020   Code Status: Full Code Inpatient     HPI:    KENTRELL HALLAHAN is a 75 y.o. male with PMH significant for hypertension, hyperlipidemia, hypothyroidism, HIV, and GERD who presented to the emergency department with right groin pain that has progressively worsened since Monday.  Patient states that last Saturday (8 days ago), he picked up a case of soda and tea and noticed that he pulled something in his right groin but he was able to continue with his ALDs without much troubles but on Monday after playing golf and helping his mother-in-law get in & out of her wheelchair, he started having more sever pain.  States that the pain initially improved with ice/heat/and Ben-Gay, but progressively worsened over the past week.  Patient states that he had difficulty sleeping due to the pain and was up at 3:00 a.m. and severe pain which prompted him to go to the emergency department.  Patient with significant right inguinal pain that is worse with palpation.  States that pain radiates to his right testicle, right lateral hip, anterior thigh, and medial calf.  States lifting his right leg up is extremely painful and he is having difficulty adjusting himself in bed due to the pain as well.     Pt has been going to a pain clinic associated with an outside facility [facility]), there he reports that he had been getting steroid injections and recently was referred to another pain manage,ent office for uncontrolled back pain.     Patient also reports night sweats and urinary urgency over the past 2-3 weeks.  Denies hematuria or suprapubic pain.  Denies subjective fever or chills, nausea, vomiting, or diarrhea. ROS also negative for chest pain or pressure, shortness of breath, or recent illness.    In ED, CT the abdomen pelvis  with IV contrast revealed fatty density along the right femoral vascular structures likely representing a small femoral hernia.  Also has a fat containing left inguinal hernia which is small.     Of note, CT also reveals pancreatic mass that was also identified on CTA of the chest 03/24/2020.  I spoke with the radiologist who compared today's images from images last admission and states there has been no change since February.  Patient was also admitted in February for chest pain. 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 Our images were reviewed with our radiologist in comparison to the report obtained from [facility] from 2017, and no intervention needed at this time.  A fine needle aspiration 2017 revealed benign etiology.  It is [facility] of this area        WBC 12.4, hemoglobin 15, CMP unremarkable, CK not elevated, UA positive for UTI        Subjective / Interval history:  Patient's symptoms are clinically is consistent with right radiculopathy.  MRI of the lumbar spine was performed and Neurosurgery was consulted.  However as per Neurosurgery patient's symptoms are not related to radiculopathy.  I will obtain consultation with Orthopedic to see  if they can figure out where the pain is coming from.  I will discontinue Dilaudid and start morphine p.r.n. for pain will continue Norco as before.    04/21/2020----patient was seen by Orthopedic Dr. Parke Simmers .  MRI of the hip and thigh was performed.    MRI demonstrates significant fluid within the hip joint and the surrounding musculature consistent with septic arthritis of the hip joint this also is consistent with his clinical examination and has significant elevation of sed rate and CRP.  Orthopedics plan to do open irrigation and debridement of the septic hip joint tomorrow.     04/22/2020----patient underwent  open irrigation and debridement of the septic hip joint today patient found to have purulent fluid from the joint which was sent for Gram staining and cultures.  Will start patient on daptomycin and cefepime and follow up on cultures.  Infectious Disease on board.    04/23/2020----patient is trial site culture is growing Staph aureus.  Urine culture was also growing Staph aureus.  2D echo has been ordered blood culture is still remain negative no growth.  Patient is improving patient is currently on daptomycin and cefepime infectious disease on board.    DENIES: chest pain, SOB, cough/sputum, fever/chills, abdo pain, nausea/ vomiting/ diarrhea/ constipation.    PHYSICAL EXAM:  VITALS: BP 135/71    Pulse 86    Temp 36.7 C (98.1 F)    Resp 18    Ht 1.778 m (5\' 10" )    Wt 79.4 kg (175 lb)    SpO2 90%    BMI 25.11 kg/m         GENERAL:   Pt is a pleasant, well-nourished, well-developed 75 y.o. male who is resting comfortably in bed in NAD. Appears stated age  53:  head normocephalic, symmetrical facies. EOM intact b/l. Sclera non-icteric, non-injected. Oropharyngeal mucous membranes are moist w/o erythema/exudates   CV: RRR. No murmurs. No BLE edema. 2+ pulses present b/l.   LUNGS: CTAB, No rhonchi, rales, wheezes.   BREAST: deferred   RECTAL: deferred  GI: (+) BS in all 4 quadrants. soft, NT/ ND. No rigidity/ guarding/ rebound.  GU: deferred  MSK: full spontaneous ROM of all 4 extremities. Gait not assessed at this time.  SKIN: No significant lesions, rashes, ecchymoses noted.   NEURO: AAOx4, CN grossly intact. Sensation intact b/l. No focal deficits.  PSYCH: Mood, behavior and affect normal w/ Intact judgement & insight         Assessment/Plan:  1. Right groin pain 2nd to  septic hip joint:  as mentioned above, patient was taken to OR today for open irrigation and debridement of the right hip joint.  Purulent fluid were obtained and sent for Gram staining in cultures.  As mentioned above, we will start  daptomycin and cefepime and follow up on cultures.  Infectious Disease on board..  2.   3. As mentioned above MRI of the lumbar spine was also performed neurosurgery was also consulted.  Patient pain is not from radiculopathy as per Neurosurgery.  Orthopedic will be consulted.  Will continue Norco as before I will discontinue Dilaudid and start morphine p.r.n. for pain  4.   5. UTI:  Have initiated Rocephin.  Urine culture pending.  6. Essential hypertension:  Blood pressures are stable.  Continue patient's home dose of Norvasc  7. Hyperlipidemia:  Continue statin  8. Hypothyroidism:  Continue Synthroid  9. HIV positive:  Continue patient's home antiviral medications.  10. History of prostate cancer:  Follows with the [place].  11. 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  [facility].  12. Sepsis on admission---resolved with IV fluids and IV antibiotics      DVT prophylaxis: Lovenox            ANCILLARY DATA:  Results for orders placed or performed during the hospital encounter of 04/19/20 (from the past 24 hour(s))   CBC/DIFF    Narrative    The following orders were created for panel order CBC/DIFF.  Procedure                               Abnormality         Status                     ---------                               -----------         ------                     CBC WITH DGLO[[id number]]                Abnormal            Final result                 Please view results for these tests on the individual orders.   BASIC METABOLIC PANEL   Result Value Ref Range    SODIUM 132 (L) 136 - 145 mmol/L    POTASSIUM 4.8 3.5 - 5.1 mmol/L    CHLORIDE 99 96 - 111 mmol/L    CO2 TOTAL 21 (L) 23 - 31 mmol/L    ANION GAP 12 4 - 13 mmol/L    CALCIUM 8.8 8.8 - 10.2 mg/dL    GLUCOSE 135 (H) 65 - 125 mg/dL    BUN 26 (H) 8 - 25 mg/dL    CREATININE 0.96 0.75 - 1.35 mg/dL    BUN/CREA RATIO 27 (H) 6 - 22    ESTIMATED GFR 78 >=60 mL/min/BSA   MAGNESIUM    Result Value Ref Range    MAGNESIUM 2.1 1.8 - 2.6 mg/dL   CBC WITH DIFF   Result Value Ref Range    WBC 12.0 (H) 3.7 - 11.0 x103/uL    RBC 3.71 (L) 4.50 - 6.10 x106/uL    HGB 11.5 (L) 13.4 - 17.5 g/dL    HCT 35.5 (L) 38.9 - 52.0 %    MCV 95.7 78.0 - 100.0 fL    MCH 31.0 26.0 - 32.0 pg    MCHC 32.4 31.0 - 35.5 g/dL    RDW-CV 14.1 11.5 - 15.5 %    PLATELETS 252 150 - 400 x103/uL    MPV 10.2 8.7 - 12.5 fL    NEUTROPHIL % 88 %    LYMPHOCYTE % 6 %    MONOCYTE % 4 %    EOSINOPHIL % 0 %    BASOPHIL % 0 %    NEUTROPHIL # 10.46 (H) 1.50 - 7.70 x103/uL    LYMPHOCYTE # 0.76 (L) 1.00 - 4.80 x103/uL    MONOCYTE # 0.49 0.20 - 1.10 x103/uL    EOSINOPHIL # <0.10 <=0.50 x103/uL    BASOPHIL # <0.10 <=  0.20 x103/uL    IMMATURE GRANULOCYTE % 2 (H) 0 - 1 %    IMMATURE GRANULOCYTE # 0.27 (H) <0.10 x103/uL        Diagnostic studies:  Results for orders placed or performed during the hospital encounter of 04/19/20 (from the past 20 hour(s))   MRI HIP RIGHT WO CONTRAST     Status: None    Narrative    Male, 75 years old.    MRI HIP RIGHT WO CONTRAST performed on 04/21/2020 2:58 PM.    REASON FOR EXAM:  SEVERE HIP PAIN, IRRITABLE HIP ON EXAM , LOW GRADE FEVER    TECHNIQUE: Multisequence multiplanar MRI right hip without gadolinium.    COMPARISON: Right femur/thigh CT of 04/19/2020.    FINDINGS:  Moderate size right hip joint effusion. Pericapsular edema throughout the right hip soft tissues. Diffuse nonspecific edema like signal involving the right adductor muscles as well as the right gluteal, hamstrings, and quadriceps muscle groups about the right hip. Fascial edema extends to the superficial fascia. Subcutaneous edema involving the right lateral hip.    No acute fracture. No femoral head AVN. No infiltrating abnormal marrow signal. Subchondral reactive marrow signal changes involving the weightbearing aspect of the right acetabulum, likely degenerative in etiology. Mild nonspecific edema like signal involving the right  peritrochanteric soft tissues.      Impression    Moderate size right hip joint effusion with diffuse edema like signal throughout the right hip musculature including the adductor, gluteal, quadriceps, and hamstrings groups. Findings concerning for right hip septic arthritis with reactive versus infectious myositis throughout the right hip musculature.      Radiologist location ID: [id number]         Inpatient Medications:  acetaminophen (TYLENOL) tablet, 500 mg, Oral, Q4H PRN  amLODIPine (NORVASC) tablet, 5 mg, Oral, Daily  atorvastatin (LIPITOR) tablet, 20 mg, Oral, QPM  ceFEPime (MAXIPIME) 2g in NS 162mL IVPB minibag, 2 g, Intravenous, Q12H  DAPTOmycin (CUBICIN) 500 mg in NS 50 mL IVPB, 6 mg/kg, Intravenous, Q24H  diphenhydrAMINE (BENADRYL) capsule, 25 mg, Oral, HS PRN  docusate sodium (COLACE) capsule, 100 mg, Oral, 2x/day  elvitegravir-cobicistat-emtricitrabine-tenofovir (GENVOYA) 150-150-200-10 mg per tablet, 1 Tablet, Oral, Daily  enoxaparin PF (LOVENOX) 40 mg/0.4 mL SubQ injection, 40 mg, Subcutaneous, Q24H  gabapentin (NEURONTIN) capsule, 300 mg, Oral, 3x/day  HYDROcodone-acetaminophen (NORCO) 5-325 mg per tablet, 1 Tablet, Oral, Q4H PRN  iopamidol (ISOVUE-370) 76% infusion, 50 mL, Intravenous, Give in Radiology  levothyroxine (SYNTHROID) tablet, 88 mcg, Oral, QAM  LR premix infusion, , Intravenous, Continuous  magnesium hydroxide (MILK OF MAGNESIA) 400mg  per 46mL oral liquid, 15 mL, Oral, Daily PRN  melatonin tablet, 10.5 mg, Oral, NIGHTLY  morphine 4 mg/mL injection, 4 mg, Intravenous, Q4H PRN  nalOXone (NARCAN) 1 mg/mL injection, 1 mg, Intravenous, Q2 MIN PRN  NS flush syringe, 3 mL, Intracatheter, Q8HRS  NS flush syringe, 3 mL, Intracatheter, Q1H PRN  ondansetron (ZOFRAN) 2 mg/mL injection, 4 mg, Intravenous, Q6H PRN       ______________________________________________________________________

## 2020-04-23 NOTE — Progress Notes (Signed)
[facility]  Orthopaedic Surgery  Progress Note    Name: [name]   Date of Service: 04/23/2020  MRN: [id number]        Subjective:  37 old white male postoperative day 1. Status post irrigation debridement of septic arthritis of the right hip joint.  Patient notes significant improvement in his pain.  He was able to transfer and walk to a bedside commode.  No fevers no chills denies any lower extremity neurological symptoms.    Objective:  Alert oriented x4 vital signs stable within normal limits he is afebrile hip dressing clean dry and intact leg lengths equivalent sciatic nerve intact.  Briawna Carver stain 1+ white blood cells no obvious organisms.  Cultures negative thus far    Assessment:  Postoperative day 1. Status post irrigation debridement of septic arthritis the right hip joint.  There was gross pus in this patient's hip this is no doubt septic arthritis despite the patient fact that the patient may have negative cultures due to his significant antibiotic loaded prior to surgery    Plan:  IV antibiotics per Infectious Disease Service.  Weightbearing as tolerated right lower extremity.  Discontinue the bedside commode we need to get him up and walking    Amberley Hamler Vincente Liberty, MD

## 2020-04-23 NOTE — Consults (Signed)
[facility]  Infectious Disease Initial Consult      [name], [name], 75 y.o. male  Date of Admission:  04/19/2020  Date of service: 04/23/2020  Date of Birth:  Mar 09, 1945    Hospital Day:  LOS: 2 days     Requesting MD: Eula Fried MD    Reason for Consult:  Septic hip joint    Impression/Recommendations:   Septic right hip joint  HIV on Genvoya well controlled treatment by [facility] Infectious Disease.  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   Diagnosed pseudocyst,follows at the [place] in [place]  US Scrotum: unremarkable  MRI right hip: moderate size hip joint effusion with diffuse edema, concerning for right hip septic arthritis with reactive versus infectious myositis throughout the right hip musculature  MRI lumbar: Lumbosacral spondylosis, mild left L4-L5 and left L5-S1 neural foraminal stenosis  Peripheral venous duplex: negative for DVT  UA: 51-100 WBC  Urine culture >100 MSSA  Ortho consulted, [place]: open I&D of the right hip via posterior approach 3/23  Fluid cell count WBC 500; RBC 684000; Seg 91%  Patient was taken to OR by Orthopedics underwent incision and drainage reported passes all the cultures remain negative Will treat for septic arthritis based on clinical finding  Urine culture also growing MSSA suspect patient might be bacteremic and spilling in the urine  Will obtain blood culture x2, echo  Will start empiric treatment with cefepime and daptomycin  Will follow-up on the final culture and final recommendation  Will go ahead and place PICC line if blood cultures remain  negative      HPI/Discussion:  [name] is a 75 y.o., White male with a past medical history significant for hypertension, hyperlipidemia, hypothyroidism, HIV, and GERD who presented to the emergency department with right groin pain that has progressively worsened since 3/21. The patient seems to have pain in his hip groin that radiates all the way down his leg to his foot.  He denies any associated neurological symptoms.  His pain is severe he is really unable to move his right leg or his right hip. States that the pain initially improved with ice/heat/and Ben-Gay,but progressively worsened over the past week. Does follow with a pain clinic in [place] where he received steroid injections for uncontrolled back pain. Patient states she had a low-grade fever upon his 1st admission to the hospital. He has been seen by Neurosurgery due to this sounding like radicular type situation.  There is nothing on his MRI scan correlates to a right lumbar radiculopathy.  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 Patient also reports night sweats and urinary urgency over the past 2-3 weeks. Denies hematuria or suprapubic pain. Denies subjective fever or chills, nausea, vomiting, or diarrhea      Past Medical History:   Diagnosis Date    Esophageal reflux     High cholesterol     HTN (hypertension)     Hypothyroidism          Past Surgical History:   Procedure Laterality Date    COLON SURGERY      HX HERNIA REPAIR  WRIST SURGERY Right          Family History:     Family Medical History:    None          No Known Allergies  acetaminophen (TYLENOL) tablet, 500 mg, Oral, Q4H PRN  amLODIPine (NORVASC) tablet, 5 mg, Oral, Daily  atorvastatin (LIPITOR) tablet, 20 mg, Oral, QPM  ceFEPime (MAXIPIME) 2g in NS 132mL IVPB minibag, 2 g, Intravenous, Q12H  DAPTOmycin (CUBICIN) 500 mg in NS 50 mL IVPB, 6 mg/kg, Intravenous, Q24H  diphenhydrAMINE (BENADRYL) capsule, 25 mg, Oral, HS PRN  docusate  sodium (COLACE) capsule, 100 mg, Oral, 2x/day  elvitegravir-cobicistat-emtricitrabine-tenofovir (GENVOYA) 150-150-200-10 mg per tablet, 1 Tablet, Oral, Daily  enoxaparin PF (LOVENOX) 40 mg/0.4 mL SubQ injection, 40 mg, Subcutaneous, Q24H  gabapentin (NEURONTIN) capsule, 300 mg, Oral, 3x/day  HYDROcodone-acetaminophen (NORCO) 5-325 mg per tablet, 1 Tablet, Oral, Q4H PRN  iopamidol (ISOVUE-370) 76% infusion, 50 mL, Intravenous, Give in Radiology  levothyroxine (SYNTHROID) tablet, 88 mcg, Oral, QAM  LR premix infusion, , Intravenous, Continuous  magnesium hydroxide (MILK OF MAGNESIA) 400mg  per 44mL oral liquid, 15 mL, Oral, Daily PRN  melatonin tablet, 10.5 mg, Oral, NIGHTLY  morphine 4 mg/mL injection, 4 mg, Intravenous, Q4H PRN  nalOXone (NARCAN) 1 mg/mL injection, 1 mg, Intravenous, Q2 MIN PRN  NS flush syringe, 3 mL, Intracatheter, Q8HRS  NS flush syringe, 3 mL, Intracatheter, Q1H PRN  ondansetron (ZOFRAN) 2 mg/mL injection, 4 mg, Intravenous, Q6H PRN          Social History:  Social History     Socioeconomic History    Marital status: Widowed     Spouse name: Not on file    Number of children: Not on file    Years of education: Not on file    Highest education level: Not on file   Occupational History    Not on file   Tobacco Use    Smoking status: Never Smoker    Smokeless tobacco: Never Used   Substance and Sexual Activity    Alcohol use: Not Currently    Drug use: Not on file    Sexual activity: Not on file   Other Topics Concern    Calcium intake adequate Not Asked    Computer Use Not Asked    Drives Not Asked    Exercise Concern Not Asked    Helmet Use Not Asked    Seat Belt Not Asked    Uses Cane Not Asked    Uses walker Not Asked    Uses wheelchair Not Asked    Right hand dominant Not Asked    Left hand dominant Not Asked    Ambidextrous Not Asked    Uses Scooter Not Asked    Activity Limitations Not Asked   Social History Narrative    Not on file     Social Determinants of Health      Financial Resource Strain: Not on file   Food Insecurity: Not on file   Transportation Needs: Not on file   Physical Activity: Not on file   Stress: Not on file   Intimate Partner Violence: Not on file   Housing Stability: Not on file        ROS:   14 point review of systems was performed, positive and negatives as above else otherwise negative    EXAM:  BP (!) 145/79 Comment: nurse notified   Pulse 81    Temp 36.8 C (98.2 F)    Resp 16  Ht 1.778 m (5\' 10" )    Wt 79.4 kg (175 lb)    SpO2 94%    BMI 25.11 kg/m       General:   75 y.o. male appearing stated age.  Well appearing.  No acute distress.  Head:  Normocephalic and atraumatic.  ENT:  Membranes are moist.  Oropharynx free of erythema, exudates and thrush.  Neck:  Supple with full range of motion trachea is midline. No lymphadenopathy.  Respiratory:  Clear to auscultation bilaterally. No wheezes, rales or rhonchi.  Cardiovascular:  Regular rate and rhythm. No murmurs, rubs or gallops.    Abdomen:  Soft, nontender and nondistended.  Bowel sounds x4.    Extremities:  2+ pedal and radial pulses palpated.  No clubbing, cyanosis or edema.   Musculoskeletal :  Bone right hip tenderness erythema decreased range of motion       Labs:    Recent Labs     04/21/20  0627 04/22/20  0426 04/23/20  0452   WBC 12.1* 11.0 12.0*   HGB 12.3* 12.9* 11.5*   HCT 36.0* 38.5* 35.5*   PLTCNT 198 235 252     Recent Labs     04/21/20  0627 04/22/20  0426 04/22/20  1426 04/23/20  0452   PMNS 82 82 91 88   LYMPHOCYTES  --   --  7  --    MONOCYTES 7 7 2 4    BASOPHILS 0   <0.10 0   <0.10 0 0   <0.10   PMNABS 9.88* 8.99*  --  10.46*   LYMPHSABS 1.12 1.02  --  0.76*   MONOSABS 0.87 0.72  --  0.49   EOSABS <0.10 <0.10  --  <0.10     Recent Labs     03/24/20  1051 03/24/20  1536 03/25/20  0443 04/19/20  0933 04/19/20  1001 04/20/20  0525 04/21/20  0627 04/22/20  0426 04/22/20  1426 04/23/20  0452   NA 133*  --    < > 135*  --    < > 132* 133*  --  132*   K 4.8  --    < > 4.5  --    < >  4.6 5.0  --  4.8   KET  --  Not Detected  --   --  Not Detected  --   --   --   --   --    CL 103  --    < > 97  --    < > 98 98  --  99   CLARITY  --   --   --   --   --   --   --   --  Cloudy  --    CLRFLD  --   --   --   --   --   --   --   --  Red  --    CO2 22*  --    < > 25  --    < > 25 23  --  21*   BUN 43*  --    < > 18  --    < > 21 21  --  26*   CREA 1.62*  --    < > 1.23  --    < > 0.98 1.09  --  0.96   ALKP 88  --   --  100  --   --   --   --   --   --  SGOT 22  --   --  13  --   --   --   --   --   --     < > = values in this interval not displayed.     Recent Labs     04/21/20  0627 04/22/20  0426 04/23/20  0452   CALCIUM 8.6* 9.1 8.8   MAGNESIUM 2.4 2.3 2.1     No results for input(s): TOTALPROTEIN, ALBUMIN, PREALBUMIN, AST, ALT, ALKPHOS, LDH, AMYLASE, LIPASE in the last 72 hours.    Microbiology:   Hospital Encounter on 04/19/20 (from the past 96 hour(s))   [place], AEROBIC    Collection Time: 04/22/20  2:23 PM    Specimen: Right Hip; Other   Culture Result Status    FLC Staphylococcus aureus (A) Preliminary    GRAM STAIN 1+ Rare PMNs Preliminary    GRAM STAIN No Organisms Seen Preliminary   [facility] SITE CULTURE AND GRAM STAIN, AEROBIC    Collection Time: 04/22/20  2:26 PM    Specimen: Right Hip; Body fluid   Culture Result Status    FLC No Growth 18-24 hrs. Preliminary    GRAM STAIN 1+ Rare PMNs Preliminary    GRAM STAIN No Organisms Seen Preliminary       Imaging Studies:   Results for orders placed or performed during the hospital encounter of 04/19/20   XR HIP RIGHT W PELVIS 2-3 VIEWS     Status: None    Narrative    Male, 75 years old.    XR HIP RIGHT W PELVIS 2-3 VIEWS performed on 04/19/2020 10:16 AM.    REASON FOR EXAM:  Right hip/ groin pain    FINDINGS: 2 views right hip are submitted for interpretation.      Impression    1. Patient is osteopenic.  2. There are mild degenerative changes without displaced fracture or dislocation.  3. Visualized bowel pattern  shows a moderate to large amount of stool.      Radiologist location ID: [id number]     CT ABDOMEN PELVIS W IV CONTRAST     Status: Abnormal    Narrative    Male, 75 years old.    CT ABDOMEN PELVIS W IV CONTRAST performed on 04/19/2020 12:46 PM.    REASON FOR EXAM:  Right groin pain    CT Dose:  1465 DLP (mGy*cm)  This CT scanner is equipped with dose reducing technology. The mAs is automatically adjusted to patient's body size in order to deliver the lowest dose possible.    CONTRAST: 100 ml's of Isovue 370    Findings: Axial postcontrast CT imaging of the abdomen and pelvis was performed along with sagittal and coronal reformats. No prior studies are available for direct comparison. The included lung bases show some mild consolidative atelectasis and likely trace pleural effusions.    There is a 5.5 x 6 x 5 cm multiloculated heterogeneous mass involving the tail the pancreas. There are further couple small cystic areas noted at the head of the pancreas. There is no evidence of surrounding inflammation or fluid. There is no peripancreatic adenopathy.    The liver shows a few simple appearing cysts. However there are also couple small hypodensities that are too small to further characterize. The gallbladder is mildly distended but no stone, sludge, wall thickening or surrounding inflammation is detected. There is no filling defect within the common duct which is smoothly marginated.    The  spleen, adrenal glands and kidneys show no acute process. Small cysts are seen at each kidney. The delayed sequence shows prompt excretion of contrast material bilaterally.    The small and large bowel show no obstructive process. There is a moderate amount of stool throughout the colon. No focal bowel wall thickening or surrounding inflammation is detected. There is no free fluid or air. Urinary bladder is distended symmetrically. Patient is status post prostatectomy. There is no free fluid or air. Patient does appear to have a  right femoral hernia with fat extending along the common femoral vein and artery. There is a small fat-containing left inguinal hernia. Bony structures are intact.      Impression    1. 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 GI consult would be recommended for potential scope/ERCP and washings. No definite evidence to suggest distal metastasis is seen.  2. There are some hypodensities involving the liver with the larger likely representing cysts although there are scattered smaller areas that are too small to further characterize.  3. There is fatty density along the right femoral vascular structures likely representing a small femoral hernia. Patient also has a fat-containing left inguinal hernia which is small.  4. Cystic changes are noted at the kidneys.  5. Mild bibasilar atelectasis and cardiomegaly are noted.      Radiologist location ID: [id number]     CT FEMUR / THIGH RIGHT W IV CONTRAST     Status: None    Narrative    Male, 75 years old.    CT FEMUR / THIGH RIGHT W IV CONTRAST performed on 04/19/2020 5:11 PM.    REASON FOR EXAM:  Intractable thigh/ leg pain  RADIATION DOSE: 498 DLP  CONTRAST: 50 ml's of Isovue 370    TECHNIQUE: Intravenous contrast utilized for study. Volumetric acquisition of the right femur/thigh. This CT scanner is equipped with dose reducing technology. The exposure is automatically adjusted according to patient body size in order to deliver the lowest dose possible.    COMPARISON: None available.    FINDINGS: No acute fracture. If the areas of calcified plaque involving the right common femoral artery. Otherwise, the imaged right common and superficial femoral arteries are patent, as is the visualized right popliteal artery. There is nonspecific subcutaneous edema about the knee and the  visualized right lower leg. No acute abnormalities within the included pelvic organs.      Impression    1.Nonspecific subcutaneous edema/stranding about the right knee and visualized right lower leg.  2.Right common femoral, superficial femoral, and visualized popliteal arteries are patent.            Radiologist location ID: [id number]     US SCROTUM     Status: None    Narrative    Male, 76 years old.    US SCROTUM performed on 04/19/2020 3:03 PM.    REASON FOR EXAM:  Right testicular pain    FINDINGS: Ultrasound evaluation of the scrotum/testicles was performed using grayscale and color Doppler imaging the bilateral testicles are within normal limits size configuration. No solid or cystic testicular lesion is seen. Blood flow is documented to each testicle. There was no hydrocele or varicocele seen on either side. Epididymal regions are unremarkable.      Impression    Unremarkable ultrasound of the scrotum/testicles.  Radiologist location ID: [id number]     MRI SPINE LUMBOSACRAL W/WO CONTRAST     Status: None    Narrative    Male, 75 years old.    MRI SPINE LUMBOSACRAL W/WO CONTRAST performed on 04/20/2020 1:57 PM.    REASON FOR EXAM:  Right Sciatica    INTRAVENOUS CONTRAST: 13 ml's of Multihance    TECHNIQUE: Multisequence multiplanar MR lumbosacral spine without and with gadolinium.    COMPARISON: MRI lumbosacral spine of 11/29/2018.    FINDINGS: Preserved lumbar lordosis. No acute fracture or traumatic malalignment. Cord and conus are normal in signal and position.    T12-L1: No high-grade spinal canal or neural foraminal stenosis.    L1-L2: Right paracentral disc protrusion. No spinal canal or neural foraminal stenosis.    L2-L3: No spinal canal or neural foraminal stenosis.    L3-L4: Posterior disc bulge. No spinal canal or neural foraminal stenosis.    L4-L5: Posterior disc bulge. Flattening of lateral recesses. No spinal canal stenosis. Mild left neural foraminal stenosis.    L5-S1: Mild posterior disc  bulge. No spinal canal stenosis. Mild left neural foraminal stenosis.    Included superior joints and straight degenerative changes.      Impression    1.Lumbosacral spondylosis without spinal canal stenosis.  2.Mild left L4-L5 and left L5-S1 neural foraminal stenosis.      Radiologist location ID: [id number]     MRI HIP RIGHT WO CONTRAST     Status: None    Narrative    Male, 75 years old.    MRI HIP RIGHT WO CONTRAST performed on 04/21/2020 2:58 PM.    REASON FOR EXAM:  SEVERE HIP PAIN, IRRITABLE HIP ON EXAM , LOW GRADE FEVER    TECHNIQUE: Multisequence multiplanar MRI right hip without gadolinium.    COMPARISON: Right femur/thigh CT of 04/19/2020.    FINDINGS:  Moderate size right hip joint effusion. Pericapsular edema throughout the right hip soft tissues. Diffuse nonspecific edema like signal involving the right adductor muscles as well as the right gluteal, hamstrings, and quadriceps muscle groups about the right hip. Fascial edema extends to the superficial fascia. Subcutaneous edema involving the right lateral hip.    No acute fracture. No femoral head AVN. No infiltrating abnormal marrow signal. Subchondral reactive marrow signal changes involving the weightbearing aspect of the right acetabulum, likely degenerative in etiology. Mild nonspecific edema like signal involving the right peritrochanteric soft tissues.      Impression    Moderate size right hip joint effusion with diffuse edema like signal throughout the right hip musculature including the adductor, gluteal, quadriceps, and hamstrings groups. Findings concerning for right hip septic arthritis with reactive versus infectious myositis throughout the right hip musculature.      Radiologist location ID: [id number]         Alinda Money, MD

## 2020-04-23 NOTE — Care Plan (Signed)
[facility]  Physical Therapy Initial Evaluation    Patient Name: [name]  Date of Birth: Sep 27, 1945  Height: Height: 177.8 cm (5\' 10" )  Weight: Weight: 79.4 kg (175 lb)  Room/Bed: 415/A  Payor: [facility] [employer] / Plan: [place] [employer] / Product Type: Managed Care /     Assessment:      (P) Patient is a 75 year old male presented to hospital secondary to right groin pain. Patient s/p I&D secondary to septic arthritis of right hip. Patient reporting he lives at home with spouse and previously independent in home and community. Patient required Min A for bed mobility, Min A for transfers and CGA for ambulation with FWW. Patient to benefit from skilled PT services to increase activity tolerance, decrease risk for falls and improve functional mobility.    Discharge Needs:    Equipment Recommendation: (P) TBD      The patient presents with mobility limitations due to impaired balance, impaired strength, and impaired functional activity tolerance that significantly impair/prevent patients ability to participate in mobility-related activities of daily living (MRADLs) including  ambulation and transfers in order to safely complete, toileting, bathing, safely entering/exiting the home. This functional mobility deficit can be sufficiently resolved with the use of a (P) TBD  in order to decrease the risk of falls, morbidity, and mortality in performance of these MRADLs.  Patient is able to safely use this assistive device.    Discharge Disposition: (P) home, home with assist, home with 24/7 assistance, home with home health    JUSTIFICATION OF DISCHARGE RECOMMENDATION   Based on current diagnosis, functional performance prior to admission, and current functional performance, this patient requires continued PT services in (P) home, home with assist, home with 24/7 assistance, home with home health in order to achieve significant functional improvements in these  deficit areas: (P) aerobic capacity/endurance, gait, locomotion, and balance, ergonomics and body mechanics, motor function, muscle performance, neuromuscular.        Plan:   Current Intervention: (P) balance training, bed mobility training, gait training, home exercise program, motor coordination training, neuromuscular re-education, patient/family education, postural re-education, strengthening, transfer training  To provide physical therapy services (P) minimum of 1x/week  for duration of (P) until discharge.    The risks/benefits of therapy have been discussed with the patient/caregiver and he/she is in agreement with the established plan of care.       Subjective & Objective        04/23/20 1129   Rehab Session   Document Type evaluation;therapy progress note (daily note)   Total PT Minutes: 29   Patient Effort adequate   General Information   Patient Profile Reviewed yes   Onset of Illness/Injury or Date of Surgery 04/19/20   Patient/Family/Caregiver Comments/Observations Patient lying supine in bed upon PT arrival, patient cleared for activity by RN. Patient in room 415, identified by name, DOB and wrist band. Patient agreeable and cooperative with PT evaluation.   Pertinent History of Current Functional Problem Patient presented to hospital secondary to right groin pain. Patient septic hip and s/p I&D   Medical Lines PIV Line;Telemetry   Respiratory Status room air   Existing Precautions/Restrictions fall precautions;full code   Weight-bearing Status   Extremity Weight-bearing Status right lower extremity   Right Lower Extremity weight-bearing as tolerated (WBAT)   Mutuality/Individual Preferences   Patient-Specific Goals (Include Timeframe) to get better to return home and back to [place]  Lives With spouse   Living Arrangements house   Living Environment Comment ramp to enter home   Functional Level Prior   Ambulation 0 - independent   Transferring 0 - independent   Toileting 0 -  independent   Bathing 0 - independent   Dressing 0 - independent   Prior Functional Level Comment independent and active golfing 5x weekly   Pre Treatment Status   Pre Treatment Patient Status Patient supine in bed;Call light within reach;Telephone within reach;Nurse approved session   Communication Pre Treatment  Nurse   Cognitive Assessment/Interventions   Behavior/Mood Observations behavior appropriate to situation, WNL/WFL;alert;cooperative   Orientation Status oriented x 4   Attention WNL/WFL   Follows Commands WNL   Pain Assessment   Pain Location - Side Right   Pain Location - Orientation posterior   Pain Location hip   RLE Assessment   RLE Assessment X-Exceptions   RLE ROM impaired   RLE Strength impaired   LLE Assessment   LLE Assessment X-Exceptions   LLE Strength 4/5 grossly MMT   Bed Mobility Assessment/Treatment   Supine-Sit Independence minimum assist (75% patient effort)   Sit to Supine, Independence minimum assist (75% patient effort)   Transfer Assessment/Treatment   Sit-Stand Independence minimum assist (75% patient effort)   Stand-Sit Independence minimum assist (75% patient effort)   Sit-Stand-Sit, Assist Device walker, front wheeled   Gait Assessment/Treatment   Total Distance Ambulated 200   Independence  contact guard assist   Assistive Device  walker, front wheeled   Distance in Feet 200   Balance Skill Training   Sitting Balance: Static fair + balance   Sitting, Dynamic (Balance) fair + balance   Sit-to-Stand Balance fair + balance   Standing Balance: Static fair + balance   Standing Balance: Dynamic fair + balance   Post Treatment Status   Post Treatment Patient Status Patient supine in bed;Call light within reach;Telephone within reach   Communication [place] With patient   Basic Mobility Am-PAC/6Clicks Score (APPROVED PT Staff and [facility] ONLY   Turning in bed without bedrails 3   Lying on back to sitting on edge of flat bed  3   Moving to and from a bed to a chair 3   Standing up from chair 3   Walk in room 3   Climbing 3-5 steps with railing 3   6 Clicks Raw Score total 18   Standardized (t-scale) score 41.05   CMS 0-100% Score 40.47   CMS Modifier CK   Physical Therapy Clinical Impression   Assessment Patient is a 75 year old male presented to hospital secondary to right groin pain. Patient s/p I&D secondary to septic arthritis of right hip. Patient reporting he lives at home with spouse and previously independent in home and community. Patient required Min A for bed mobility, Min A for transfers and CGA for ambulation with FWW. Patient to benefit from skilled PT services to increase activity tolerance, decrease risk for falls and improve functional mobility.   Patient/Family Goals Statement To get better to go home   Criteria for Skilled Therapeutic yes   Pathology/Pathophysiology Noted musculoskeletal   Impairments Found (describe specific impairments) aerobic capacity/endurance;gait, locomotion, and balance;ergonomics and body mechanics;motor function;muscle performance;neuromuscular   Functional Limitations in Following  self-care;home management   Rehab Potential good, to achieve stated therapy goals   Therapy Frequency minimum of 1x/week   Predicted Duration of  Therapy Intervention (days/wks) until discharge   Anticipated Equipment Needs at Discharge (PT) TBD   Anticipated Discharge Disposition home;home with assist;home with 24/7 assistance;home with home health   Evaluation Complexity Justification   Patient History: Co-morbidity/factors that impact Plan of [facility] or more other medical co-morbidity   Examination Components 1-2 Exam elements addressed   Presentation Stable: Uncomplicated, straight-forward, problem focused   Clinical Decision Making Low complexity   Evaluation Complexity Low complexity   Care Plan Goals   PT Rehab Goals Physical Therapy Goal;Bed Mobility Goal;Gait Training Goal;Transfer Training Goal    Physical Therapy Goal   PT  Goal, Date Established 04/23/20   PT Goal, Time to Achieve by discharge   PT Goal, Activity Type Patient to tolerate >/= 15 min therapeutic exercise to increase circulation, joint mobility and strength   PT Goal, Independence Level supervision required   Bed Mobility Goal   Bed Mobility Goal, Date Established 04/23/20   Bed Mobility Goal, Time to Achieve by discharge   Bed Mobility Goal, Activity Type all bed mobility activities   Bed Mobility Goal, Independence Level independent   Bed Mobility Goal, Assistive Device least restrictive assistive device   Gait Training  Goal, Distance to Achieve   Gait Training  Goal, Date Established 04/23/20   Gait Training  Goal, Time to Achieve by discharge   Gait Training  Goal, Independence Level supervision required   Gait Training  Goal, Assist Device walker, rolling;least restricted assistive device   Gait Training  Goal, Distance to Achieve 500   Transfer Training Goal   Transfer Training Goal, Date Established 04/23/20   Transfer Training Goal, Time to Achieve by discharge   Transfer Training Goal, Activity Type all transfers   Transfer Training Goal, Independence Level supervision required   Transfer Training Goal, Assist Device walker, rolling;least restrictrictive assistive device   Planned Therapy Interventions, PT Eval   Planned Therapy Interventions (PT) balance training;bed mobility training;gait training;home exercise program;motor coordination training;neuromuscular re-education;patient/family education;postural re-education;strengthening;transfer training     Patient seen for PT evaluation 1100-1115 and gait training [phone]. Patient performed supine to sitting at EOB with Min A for RLE management. Patient performed static sitting at EOB with SBA. Performed sit to standing from EOB with Min A and FWW, verbal cues for forward weigh shifting and pushing form bed. Patient performed ambulation 200 ft with FWW and CGA, verbal cues for  erect posture, step length and ground clearance. Patient requesting to use restroom, required Min A and handrail to sit safely to chair. Patient sitting on toilet and encouraged to pull call light, nursing student in room and aware patient on toilet and stated she would assist patient. Notified Marcene Brawn, RN patient on the toilet.     Therapist:   Noralee Chars, [place] [id number]    Start Time: 1100  End Time: 1129  Evaluation Time: 29 minutes  Charges Entered: EVAL and GT  Department Number: [id number]

## 2020-04-23 NOTE — Care Plan (Signed)
Patient slept most the night. Took morphine one time at beginning of shift then said he wanted the Norco the rest of shift when he calls for it.  Ice pack applied. SCD;s wore most the night till this morning then he wanted them off for awhile. Call light in reach, bed alarm on, fall risk management maintained.     Problem: Adult Inpatient Plan of Care  Goal: Plan of Care Review  Outcome: Ongoing (see interventions/notes)  Flowsheets (Taken 04/23/2020 0618)  Plan of Care Reviewed With: patient  Progress: improving  Goal: Patient-Specific Goal (Individualized)  Outcome: Ongoing (see interventions/notes)     Problem: Fall Injury Risk  Goal: Absence of Fall and Fall-Related Injury  Outcome: Ongoing (see interventions/notes)     Problem: Pain Acute  Goal: Acceptable Pain Control and Functional Ability  Outcome: Ongoing (see interventions/notes)     Problem: Fall Injury Risk  Goal: Absence of Fall and Fall-Related Injury  Outcome: Ongoing (see interventions/notes)

## 2020-04-23 NOTE — Care Plan (Signed)
Patient has been given MOM to promote BM. Patient has had blood cultures, ECHO done today. Plan to wait until blood cultures negative for 24 hours, have PICC placed, return home to complete 6 weeks of IV antibiotics. Pain has primarily been controlled with PO pain medication; has need IV pain medication once. Patient doing well with PT, assist x 1.

## 2020-04-23 NOTE — Nurses Notes (Signed)
IS teaching completed.

## 2020-04-23 NOTE — Nurses Notes (Signed)
Patient sitting up in bed, alert and oriented. Respirations are even and unlabored on 2L NC. No signs or symptoms of distress noted. IV in left AC infusing, site WNL. Dressing noted to left hip, scant dried drainage noted. Patient denies needs at this time. Will continue to monitor. All safety measures maintained. Call light is within reach.

## 2020-04-24 ENCOUNTER — Encounter (HOSPITAL_COMMUNITY): Payer: Self-pay | Admitting: Internal Medicine

## 2020-04-24 DIAGNOSIS — B9561 Methicillin susceptible Staphylococcus aureus infection as the cause of diseases classified elsewhere: Secondary | ICD-10-CM

## 2020-04-24 LAB — CBC WITH DIFF
BASOPHIL #: <0.1 10*3/uL (ref <=0.20)
BASOPHIL %: 0 %
EOSINOPHIL #: <0.1 10*3/uL (ref <=0.50)
EOSINOPHIL %: 0 %
HCT: 35.8 % — ABNORMAL LOW (ref 38.9–52.0)
HGB: 11.6 g/dL — ABNORMAL LOW (ref 13.4–17.5)
IMMATURE GRANULOCYTE #: 0.3 10*3/uL — ABNORMAL HIGH (ref <0.10)
IMMATURE GRANULOCYTE %: 3 % — ABNORMAL HIGH (ref 0–1)
LYMPHOCYTE #: 1.26 10*3/uL (ref 1.00–4.80)
LYMPHOCYTE %: 11 %
MCH: 30.7 pg (ref 26.0–32.0)
MCHC: 32.4 g/dL (ref 31.0–35.5)
MCV: 94.7 fL (ref 78.0–100.0)
MONOCYTE #: 0.77 10*3/uL (ref 0.20–1.10)
MONOCYTE %: 7 %
MPV: 10.5 fL (ref 8.7–12.5)
NEUTROPHIL #: 9.42 10*3/uL — ABNORMAL HIGH (ref 1.50–7.70)
NEUTROPHIL %: 79 %
PLATELETS: 313 10*3/uL (ref 150–400)
RBC: 3.78 10*6/uL — ABNORMAL LOW (ref 4.50–6.10)
RDW-CV: 14.2 % (ref 11.5–15.5)
WBC: 11.8 10*3/uL — ABNORMAL HIGH (ref 3.7–11.0)

## 2020-04-24 LAB — BASIC METABOLIC PANEL
ANION GAP: 15 mmol/L — ABNORMAL HIGH (ref 4–13)
BUN/CREA RATIO: 27 — ABNORMAL HIGH (ref 6–22)
BUN: 26 mg/dL — ABNORMAL HIGH (ref 8–25)
CALCIUM: 8.8 mg/dL (ref 8.8–10.2)
CHLORIDE: 98 mmol/L (ref 96–111)
CO2 TOTAL: 20 mmol/L — ABNORMAL LOW (ref 23–31)
CREATININE: 0.96 mg/dL (ref 0.75–1.35)
ESTIMATED GFR: 78 mL/min/BSA (ref >=60)
GLUCOSE: 121 mg/dL (ref 65–125)
POTASSIUM: 5.2 mmol/L — ABNORMAL HIGH (ref 3.5–5.1)
SODIUM: 133 mmol/L — ABNORMAL LOW (ref 136–145)

## 2020-04-24 LAB — STERILE SITE CULTURE AND GRAM STAIN, AEROBIC: GRAM STAIN: NONE SEEN

## 2020-04-24 LAB — ANAEROBIC CULTURE

## 2020-04-24 LAB — MAGNESIUM: MAGNESIUM: 2.3 mg/dL (ref 1.8–2.6)

## 2020-04-24 MED ORDER — SODIUM CHLORIDE 0.9 % (FLUSH) INJECTION SYRINGE
10.0000 mL | INJECTION | Freq: Three times a day (TID) | INTRAMUSCULAR | Status: DC
Start: 2020-04-24 — End: 2020-04-28
  Administered 2020-04-24: 0 mL via INTRAVENOUS
  Administered 2020-04-25: 10 mL via INTRAVENOUS
  Administered 2020-04-25 (×2): 0 mL via INTRAVENOUS
  Administered 2020-04-26 (×2): 10 mL via INTRAVENOUS
  Administered 2020-04-26: 0 mL via INTRAVENOUS
  Administered 2020-04-27 (×2): 10 mL via INTRAVENOUS
  Administered 2020-04-27 – 2020-04-28 (×3): 0 mL via INTRAVENOUS

## 2020-04-24 MED ORDER — CEFAZOLIN 2 GRAM/100 ML IN DEXTROSE(ISO-OSMOTIC) INTRAVENOUS PIGGYBACK
2.0000 g | INJECTION | Freq: Three times a day (TID) | INTRAVENOUS | Status: DC
Start: 2020-04-24 — End: 2020-04-28
  Administered 2020-04-24: 0 g via INTRAVENOUS
  Administered 2020-04-24 (×2): 2 g via INTRAVENOUS
  Administered 2020-04-24: 0 g via INTRAVENOUS
  Administered 2020-04-25 (×2): 2 g via INTRAVENOUS
  Administered 2020-04-25: 0 g via INTRAVENOUS
  Administered 2020-04-25: 11:00:00 2 g via INTRAVENOUS
  Administered 2020-04-25 – 2020-04-26 (×5): 0 g via INTRAVENOUS
  Administered 2020-04-26 (×3): 2 g via INTRAVENOUS
  Administered 2020-04-27 (×2): 0 g via INTRAVENOUS
  Administered 2020-04-27 (×3): 2 g via INTRAVENOUS
  Administered 2020-04-27: 0 g via INTRAVENOUS
  Administered 2020-04-28: 12:00:00 2 g via INTRAVENOUS
  Administered 2020-04-28 (×2): 0 g via INTRAVENOUS
  Administered 2020-04-28 (×2): 2 g via INTRAVENOUS
  Filled 2020-04-24 (×15): qty 100

## 2020-04-24 MED ORDER — SODIUM CHLORIDE 0.9 % (FLUSH) INJECTION SYRINGE
20.0000 mL | INJECTION | INTRAMUSCULAR | Status: DC | PRN
Start: 2020-04-24 — End: 2020-04-28

## 2020-04-24 NOTE — Nurses Notes (Addendum)
Patient resting in bed on room air with even and unlabored respirations. Right hip dressing is clean dry and intact with minimal old drainage. Patient IV site is WDL. No concerns or complaints at this time. All safety measures maintained. Will continue to monitor.

## 2020-04-24 NOTE — Care Plan (Signed)
[facility]  Physical Therapy Progress Note      Patient Name: [name]  Date of Birth: 1945/04/04  Height:  177.8 cm (5\' 10" )  Weight:  79.4 kg (175 lb)  Room/Bed: 415/A  Payor: [facility] [employer] / Plan: [place] [facility] / Product Type: Managed Care /     Assessment:      Patient tolerated visit fair, patient limited in functional mobility secondary to right hip pain, fatigue and weakness. Patient required CGA for bed mobility, transfers and ambulation with FWW. Patient to benefit from skilled PT services to increase activity tolerance, decrease risk for falls and improve functional mobility.        04/24/20 0951   Rehab Session   Document Type therapy progress note (daily note)   Basic Mobility Am-PAC/6Clicks Score (APPROVED PT Staff and [place]   Turning in bed without bedrails 3   Lying on back to sitting on edge of flat bed 3   Moving to and from a bed to a chair 3   Standing up from chair 3   Walk in room 3   Climbing 3-5 steps with railing 3   6 Clicks Raw Score total 18   Standardized (t-scale) score 41.05   CMS 0-100% Score 40.47   CMS Modifier CK       Plan:   Continue to follow patient according to established plan of care.  The risks/benefits of therapy have been discussed with the patient/caregiver and he/she is in agreement with the established plan of care.     Subjective & Objective:     S: Patient lying supine in bed upon PT arrival, patient cleared for activity by RN. Patient in room 415, identified by name, DOB and wrist band. Patient agreeable and cooperative with PT visit.    O: Patient education on supine therapeutic exercises patient performed ankle pumps, quad sets and heel slides 10x each with verbal and tactile cues for muscle recruitment and ROM. Patient difficulty sustaining quad contraction. Patient performed supine to sitting at EOB with CGA for RLE management, maintained static sitting at EOB with BUE support and  supervision. Patient performed sit to standing from EOB with CGA and verbal cues for pushing up from bed and forward weight shifting. Patient performed ambulation 200 ft with FWW and verbal cues for standing within walker, step length and ground clearance. Patient education on right stance phase and knee extension during stance phase, patient presented with slight knee flexion during stance phase secondary to quadriceps weakness. Patient returned to rest at EOB for 3-4 mins. Patient performed ambulation 200 ft with CGA and FWW. Patient returned to resting at EOB 1-2 mins and requesting to go to bathroom. Patient required CGA to safely sit onto toilet, patient requesting privacy and notified RN patient sitting on toilet. Patient requesting PT to return to review exercises.    Returned to see patient [id number] to review HEP. Performed supine exercises quad set, SAQ and heel slides with verbal and tactile cues 50% for muscle recruitment and ROM. Patient reporting increased fatigue activities this visit. Patient left supine in bed with call light and phone within reach.     Therapist:   Noralee Chars, PT  [phone]    Start Time: 078  End Time: 675  AM Treatment Time: 38 minutes    Start Time: 1011  End Time: 1026  AM Treatment Time: 15 minutes    Total Treatment Time: 53 minutes  Charges Entered: TA, GT and TXx2  Department Number: [phone]

## 2020-04-24 NOTE — Care Plan (Signed)
Patient rested well in bed during shift. Hip dressing is clean dry and intact with minimal old drainage. Patient worked with therapy. PICC line placed in left upper arm. Wife visited at bedside. All safety measures maintained.

## 2020-04-24 NOTE — Care Plan (Signed)
Patient axox4.  Patient took pain medication last night and slept thru the night.  Patient had BM last night and has been urinating and drinking. Patient walked to bathroom with walker.  Call light in reach.

## 2020-04-24 NOTE — Progress Notes (Signed)
[facility]  Infectious Disease Consult  Follow Up Note    [name], [name], 75 y.o. male  Date of Service: 04/24/2020  Date of Birth:  May 18, 1945    Hospital Day:  LOS: 3 days     [name] is a 75 y.o., White male with a past medical history significant forhypertension, hyperlipidemia, hypothyroidism, HIV, and GERDwho presented to the emergency department with right groin pain that has progressively worsened since 3/21. The patient seems to have pain in his hip groin that radiates all the way down his leg to his foot. He denies any associated neurological symptoms. His pain is severe he is really unable to move his right leg or his right hip. States that the pain initially improved with ice/heat/and [place],but progressively worsened over the past week. Does follow with a pain clinic in [place] where he received steroid injections for uncontrolled back pain.Patient states she had a low-grade fever upon his 1st admission to the hospital. He has been seen by Neurosurgery due to this sounding like radicular type situation. There is nothing on his MRI scan correlates to a right lumbar radiculopathy. 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 Patient also reports night sweats and urinary urgency over the past 2-3 weeks. Denies hematuria or suprapubic pain. Denies subjective fever or chills, nausea, vomiting, or diarrhea      Physical Exam  BP 108/68   Pulse 83   Temp 36.6 C (97.9 F)   Resp 12   Ht 1.778 m ($Remove'5\' 10"'pGbXVEL$ )   Wt 79.4 kg (175 lb)   SpO2 94%   BMI 25.11 kg/m     General:   75 y.o. male appearing stated age.  Well appearing.  No acute distress.  Head:  Normocephalic and atraumatic.  ENT:  Membranes are moist.  Oropharynx free of erythema, exudates and thrush.  Neck:  Supple with full range of motion trachea is midline. No lymphadenopathy.  Respiratory:  Clear to auscultation bilaterally. No wheezes, rales or rhonchi.  Cardiovascular:  Regular  rate and rhythm. No murmurs, rubs or gallops.    Abdomen:  Soft, nontender and nondistended.  Bowel sounds x4.    Extremities:  2+ pedal and radial pulses palpated.  No clubbing, cyanosis or edema.   Musculoskeletal :  Bone right hip tenderness erythema decreased range of motion    Labs: CBC with Diff (Last 48 Hours):    Recent Results in last 48 hours     04/22/20  1426 04/23/20  0452 04/24/20  0424   WBC  --  12.0* 11.8*   HGB  --  11.5* 11.6*   HCT  --  35.5* 35.8*   MCV  --  95.7 94.7   PLTCNT  --  252 313   PMNS 91 88 79   LYMPHO  --  6 11   LYMPHOCYTES 7  --   --    MONOCYTES $RemoveBe'2 4 7   'QriPLYczc$ EOSINO  --  0 0   BASOPHILS 0 0  <0.10 0  <0.10       Last BMP  (Last result in the past 48 hours)      Na   K   Cl   CO2   BUN   Cr   Calcium   Glucose   Glucose-Fasting        04/24/20 0424 133   5.2   98   20   26   0.96  8.8   121             Lab Results   Component Value Date    ESR 122 (H) 04/21/2020         Microbiology:   Hospital Encounter on 04/19/20 (from the past 96 hour(s))   FUNGUS CULTURE    Collection Time: 04/22/20  2:23 PM    Specimen: Right Hip; Other   Culture Result Status    FUNGAL CULTURE No Growth 18-24 hrs. Preliminary   STERILE SITE CULTURE AND GRAM STAIN, AEROBIC    Collection Time: 04/22/20  2:23 PM    Specimen: Right Hip; Other   Culture Result Status    FLC Staphylococcus aureus (A) Preliminary    GRAM STAIN 1+ Rare PMNs Preliminary    GRAM STAIN No Organisms Seen Preliminary   STERILE SITE CULTURE AND GRAM STAIN, AEROBIC    Collection Time: 04/22/20  2:26 PM    Specimen: Right Hip; Body fluid   Culture Result Status    FLC No Growth 18-24 hrs. Preliminary    GRAM STAIN 1+ Rare PMNs Preliminary    GRAM STAIN No Organisms Seen Preliminary       Imaging Studies:    Results for orders placed or performed during the hospital encounter of 04/19/20 (from the past 72 hour(s))   MRI HIP RIGHT WO CONTRAST     Status: None    Narrative    Male, 75 years old.    MRI HIP RIGHT WO CONTRAST performed on  04/21/2020 2:58 PM.    REASON FOR EXAM:  SEVERE HIP PAIN, IRRITABLE HIP ON EXAM , LOW GRADE FEVER    TECHNIQUE: Multisequence multiplanar MRI right hip without gadolinium.    COMPARISON: Right femur/thigh CT of 04/19/2020.    FINDINGS:  Moderate size right hip joint effusion. Pericapsular edema throughout the right hip soft tissues. Diffuse nonspecific edema like signal involving the right adductor muscles as well as the right gluteal, hamstrings, and quadriceps muscle groups about the right hip. Fascial edema extends to the superficial fascia. Subcutaneous edema involving the right lateral hip.    No acute fracture. No femoral head AVN. No infiltrating abnormal marrow signal. Subchondral reactive marrow signal changes involving the weightbearing aspect of the right acetabulum, likely degenerative in etiology. Mild nonspecific edema like signal involving the right peritrochanteric soft tissues.      Impression    Moderate size right hip joint effusion with diffuse edema like signal throughout the right hip musculature including the adductor, gluteal, quadriceps, and hamstrings groups. Findings concerning for right hip septic arthritis with reactive versus infectious myositis throughout the right hip musculature.      Radiologist location ID: [id number]         Assessment/Recommendations:  Septic right hip joint  HIV on [place] well controlled treatment by [facility].  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                Diagnosed pseudocyst,follows at the [facility] [place] in  [place]  US Scrotum: unremarkable  MRI right hip: moderate size hip joint effusion with diffuse edema, concerning for right hip  septic arthritis with reactive versus infectious myositis throughout the right hip musculature  MRI lumbar: Lumbosacral spondylosis, mild left L4-L5 and left L5-S1 neural foraminal stenosis  Peripheral venous duplex: negative for DVT  UA: 51-100 WBC  Urine culture >100 MSSA  Ortho consulted, [place]: open I&D of the right hip via posterior approach 3/23  Fluid cell count WBC 500; RBC 684000; Seg 91%  Patient was taken to OR by Orthopedics underwent incision and drainage   1/2 sterile site culture growing MSSA preliminarily   Urine culture also growing MSSA suspect patient might be bacteremic and spilling in the urine  Blood culture pending  TTE: Technically difficult and limited study, no vegetations noted    Switch cefepime and daptomycin to Ancef  Will follow-up on the final culture and final recommendation  Will go ahead and place PICC line if blood cultures remain negative  Plan for 6 weeks Ancef pending final blood cultures  Patient will need CBC, CRP, ESR weekly     Hanley Seamen, MED STUDENT      I have reviewed and examined the patient with the medical student. Key points discussed and I agree with the above assessment and plan.    Alinda Money, MD

## 2020-04-24 NOTE — Progress Notes (Addendum)
[facility]  HOSPITALIST PROGRESS NOTE      [name]                     75 y.o. male [id number]    Date of service: 04/24/2020      Date of Admission:  04/19/2020   Code Status: Full Code Inpatient     HPI:    [name] is a 75 y.o. male with PMH significant for hypertension, hyperlipidemia, hypothyroidism, HIV, and GERD who presented to the emergency department with right groin pain that has progressively worsened since Monday.  Patient states that last Saturday (8 days ago), he picked up a case of soda and tea and noticed that he pulled something in his right groin but he was able to continue with his ALDs without much troubles but on Monday after playing golf and helping his mother-in-law get in & out of her wheelchair, he started having more sever pain.  States that the pain initially improved with ice/heat/and Ben-Gay, but progressively worsened over the past week.  Patient states that he had difficulty sleeping due to the pain and was up at 3:00 a.m. and severe pain which prompted him to go to the emergency department.  Patient with significant right inguinal pain that is worse with palpation.  States that pain radiates to his right testicle, right lateral hip, anterior thigh, and medial calf.  States lifting his right leg up is extremely painful and he is having difficulty adjusting himself in bed due to the pain as well.     Pt has been going to a pain clinic associated with an outside facility [facility]), there he reports that he had been getting steroid injections and recently was referred to another pain manage,ent office for uncontrolled back pain.     Patient also reports night sweats and urinary urgency over the past 2-3 weeks.  Denies hematuria or suprapubic pain.  Denies subjective fever or chills, nausea, vomiting, or diarrhea. ROS also negative for chest pain or pressure, shortness of breath, or recent illness.    In ED, CT the abdomen pelvis  with IV contrast revealed fatty density along the right femoral vascular structures likely representing a small femoral hernia.  Also has a fat containing left inguinal hernia which is small.     Of note, CT also reveals pancreatic mass that was also identified on CTA of the chest 03/24/2020.  I spoke with the radiologist who compared today's images from images last admission and states there has been no change since February.  Patient was also admitted in February for chest pain. 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 Our images were reviewed with our radiologist in comparison to the report obtained from [facility] from 2017, and no intervention needed at this time.  A fine needle aspiration 2017 revealed benign etiology.  It is [facility] of this area        WBC 12.4, hemoglobin 15, CMP unremarkable, CK not elevated, UA positive for UTI        Subjective / Interval history:  Patient's symptoms are clinically is consistent with right radiculopathy.  MRI of the lumbar spine was performed and Neurosurgery was consulted.  However as per Neurosurgery patient's symptoms are not related to radiculopathy.  I will obtain consultation with Orthopedic to see  if they can figure out where the pain is coming from.  I will discontinue Dilaudid and start morphine p.r.n. for pain will continue Norco as before.    04/21/2020----patient was seen by Orthopedic Dr. Parke Simmers .  MRI of the hip and thigh was performed.    MRI demonstrates significant fluid within the hip joint and the surrounding musculature consistent with septic arthritis of the hip joint this also is consistent with his clinical examination and has significant elevation of sed rate and CRP.  Orthopedics plan to do open irrigation and debridement of the septic hip joint tomorrow.     04/22/2020----patient underwent  open irrigation and debridement of the septic hip joint today patient found to have purulent fluid from the joint which was sent for Gram staining and cultures.  Will start patient on daptomycin and cefepime and follow up on cultures.  Infectious Disease on board.    04/23/2020----patient is trial site culture is growing Staph aureus.  Urine culture was also growing Staph aureus.  2D echo has been ordered blood culture is still remain negative no growth.  Patient is improving patient is currently on daptomycin and cefepime infectious disease on board.    04/24/2020----2D echo is a technically difficult study no vegetation noted.  So far blood cultures are showing no growth.  Daptomycin cefepime discontinued today.  Patient started Ancef for MSSA infection.  Six weeks of IV antibiotics if blood cultures remain negative.  No complaint offered by the patient.    DENIES: chest pain, SOB, cough/sputum, fever/chills, abdo pain, nausea/ vomiting/ diarrhea/ constipation.    PHYSICAL EXAM:  VITALS: BP 125/69    Pulse 82    Temp 36.5 C (97.7 F)    Resp 16    Ht 1.778 m (5\' 10" )    Wt 79.4 kg (175 lb)    SpO2 90%    BMI 25.11 kg/m         GENERAL:   Pt is a pleasant, well-nourished, well-developed 75 y.o. male who is resting comfortably in bed in NAD. Appears stated age  31:  head normocephalic, symmetrical facies. EOM intact b/l. Sclera non-icteric, non-injected. Oropharyngeal mucous membranes are moist w/o erythema/exudates   CV: RRR. No murmurs. No BLE edema. 2+ pulses present b/l.   LUNGS: CTAB, No rhonchi, rales, wheezes.   BREAST: deferred   RECTAL: deferred  GI: (+) BS in all 4 quadrants. soft, NT/ ND. No rigidity/ guarding/ rebound.  GU: deferred  MSK: full spontaneous ROM of all 4 extremities. Gait not assessed at this time.  SKIN: No significant lesions, rashes, ecchymoses noted.   NEURO: AAOx4, CN grossly intact. Sensation intact b/l. No focal deficits.  PSYCH: Mood, behavior and affect normal w/ Intact  judgement & insight         Assessment/Plan:  1. Right groin pain 2nd to  septic hip joint:  as mentioned above, patient was taken to OR today for open irrigation and debridement of the right hip joint.  Purulent fluid were obtained and sent for Gram staining in cultures.  As mentioned above, sterile culture growing MSSA.  Daptomycin and cefepime has been discontinued patient has been started on Ancef.  2.   3. As mentioned above MRI of the lumbar spine was also performed neurosurgery was also consulted.  Patient pain is not from radiculopathy as per Neurosurgery.  Orthopedic will be consulted.  Will continue Norco as before I will discontinue Dilaudid and start morphine p.r.n. for pain  4.   5.  UTI:   urine culture culture growing MSSA.  Patient on Ancef.  6. Essential hypertension:  Blood pressures are stable.  Continue patient's home dose of Norvasc  7. Hyperlipidemia:  Continue statin  8. Hypothyroidism:  Continue Synthroid  9. HIV positive:  Continue patient's home antiviral medications.  10. History of prostate cancer:  Follows with the [place].  11. 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  [facility].  12. Sepsis on admission----resolved with IV fluids and IV antibiotics      DVT prophylaxis: Lovenox            ANCILLARY DATA:  Results for orders placed or performed during the hospital encounter of 04/19/20 (from the past 24 hour(s))   CBC/DIFF    Narrative    The following orders were created for panel order CBC/DIFF.  Procedure                               Abnormality         Status                     ---------                               -----------         ------                     CBC WITH SNKN[[id number]]                Abnormal            Final result                 Please view results for these tests on the individual orders.   BASIC METABOLIC PANEL   Result Value Ref Range    SODIUM 133 (L) 136 - 145 mmol/L     POTASSIUM 5.2 (H) 3.5 - 5.1 mmol/L    CHLORIDE 98 96 - 111 mmol/L    CO2 TOTAL 20 (L) 23 - 31 mmol/L    ANION GAP 15 (H) 4 - 13 mmol/L    CALCIUM 8.8 8.8 - 10.2 mg/dL    GLUCOSE 121 65 - 125 mg/dL    BUN 26 (H) 8 - 25 mg/dL    CREATININE 0.96 0.75 - 1.35 mg/dL    BUN/CREA RATIO 27 (H) 6 - 22    ESTIMATED GFR 78 >=60 mL/min/BSA    Narrative    Hemolysis can alter results at this level (slight).   MAGNESIUM   Result Value Ref Range    MAGNESIUM 2.3 1.8 - 2.6 mg/dL    Narrative    Hemolysis can alter results at this level (slight).   CBC WITH DIFF   Result Value Ref Range    WBC 11.8 (H) 3.7 - 11.0 x103/uL    RBC 3.78 (L) 4.50 - 6.10 x106/uL    HGB 11.6 (L) 13.4 - 17.5 g/dL    HCT 35.8 (L) 38.9 - 52.0 %    MCV 94.7 78.0 - 100.0 fL    MCH 30.7 26.0 - 32.0 pg    MCHC 32.4 31.0 - 35.5 g/dL    RDW-CV 14.2 11.5 - 15.5 %    PLATELETS 313 150 - 400 x103/uL  MPV 10.5 8.7 - 12.5 fL    NEUTROPHIL % 79 %    LYMPHOCYTE % 11 %    MONOCYTE % 7 %    EOSINOPHIL % 0 %    BASOPHIL % 0 %    NEUTROPHIL # 9.42 (H) 1.50 - 7.70 x103/uL    LYMPHOCYTE # 1.26 1.00 - 4.80 x103/uL    MONOCYTE # 0.77 0.20 - 1.10 x103/uL    EOSINOPHIL # <0.10 <=0.50 x103/uL    BASOPHIL # <0.10 <=0.20 x103/uL    IMMATURE GRANULOCYTE % 3 (H) 0 - 1 %    IMMATURE GRANULOCYTE # 0.30 (H) <0.10 x103/uL        Diagnostic studies:       Inpatient Medications:  acetaminophen (TYLENOL) tablet, 500 mg, Oral, Q4H PRN  amLODIPine (NORVASC) tablet, 5 mg, Oral, Daily  atorvastatin (LIPITOR) tablet, 20 mg, Oral, QPM  ceFAZolin (ANCEF) 2 g in iso-osmotic 100 mL premix IVPB, 2 g, Intravenous, Q8H  diphenhydrAMINE (BENADRYL) capsule, 25 mg, Oral, HS PRN  docusate sodium (COLACE) capsule, 100 mg, Oral, 2x/day  elvitegravir-cobicistat-emtricitrabine-tenofovir (GENVOYA) 150-150-200-10 mg per tablet, 1 Tablet, Oral, Daily  enoxaparin PF (LOVENOX) 40 mg/0.4 mL SubQ injection, 40 mg, Subcutaneous, Q24H  gabapentin (NEURONTIN) capsule, 300 mg, Oral,  3x/day  HYDROcodone-acetaminophen (NORCO) 5-325 mg per tablet, 1 Tablet, Oral, Q4H PRN  iopamidol (ISOVUE-370) 76% infusion, 50 mL, Intravenous, Give in Radiology  levothyroxine (SYNTHROID) tablet, 88 mcg, Oral, QAM  magnesium hydroxide (MILK OF MAGNESIA) 400mg  per 22mL oral liquid, 15 mL, Oral, Daily PRN  melatonin tablet, 10.5 mg, Oral, NIGHTLY  morphine 4 mg/mL injection, 4 mg, Intravenous, Q4H PRN  nalOXone (NARCAN) 1 mg/mL injection, 1 mg, Intravenous, Q2 MIN PRN  NS flush syringe, 3 mL, Intracatheter, Q8HRS  NS flush syringe, 3 mL, Intracatheter, Q1H PRN  NS flush syringe, 10 mL, Intravenous, Q8HRS  NS flush syringe, 20 mL, Intravenous, Q1 MIN PRN  ondansetron (ZOFRAN) 2 mg/mL injection, 4 mg, Intravenous, Q6H PRN       ______________________________________________________________________

## 2020-04-24 NOTE — Progress Notes (Signed)
[facility]  Orthopaedics Progress Note    Name: [name]  Date of service: 04/24/2020  Post Op Day: 2 Days Post-Op S/P irrigation and debridement septic arthritis right hip    Subjective:  75 year old male with septic arthritis of the right hip status post irrigation and debridement.  Culture showing Staphylococcus aureus.  Patient reports dramatic improvement in his right hip pain since surgery.  Notes "soreness" in the hip with certain movements and weight-bearing.  He has been able to ambulate in his room. He denies any continued fevers or chills.  He is tolerating oral intake without nausea or vomiting.  He remains on IV antibiotic therapy and Infectious Disease team is following.    Vital Signs:  Temperature: 36.6 C (97.9 F) (04/24/20 0800)  Heart Rate: 83 (04/24/20 0800)  BP (Non-Invasive): 108/68 (04/24/20 0800)  Respiratory Rate: 12 (04/24/20 0800)  SpO2: 94 % (04/24/20 0800)    Today's Physical Exam:  General:  Alert, cooperative, no acute distress.  Resting comfortably.  Incisions/Dressing:  Surgical dressing with mild serosanguineous drainage.  Appears stable.  No erythema or induration extending beyond the dressing.  Musculoskeletal:  Tolerates passive motion of the hip with only mild discomfort.  This is much improved from preoperative status.  Sciatic nerve motor function intact.  Neurovascular:  Grossly neurovascularly intact distally.  Sensation intact to light touch distally.      Assessment/ Plan:  Septic arthritis right hip  - Status post irrigation and debridement right hip, POD #2  - Patient doing well.  Pain improving.    - Progressive ambulation as tolerated.  WBAT RLE.  - Dressing intact until follow-up in about 10 days.  - Antibiotics per Infectious Disease team recommendations.    Ardelle Balls, PA-C  Orthopaedic Surgery

## 2020-04-24 NOTE — Nurses Notes (Signed)
PICC placed via LUA basilic, see IV flowsheet documentation. Care discussed at length, care booklet and ID card given.

## 2020-04-25 DIAGNOSIS — Z9889 Other specified postprocedural states: Secondary | ICD-10-CM

## 2020-04-25 DIAGNOSIS — K219 Gastro-esophageal reflux disease without esophagitis: Secondary | ICD-10-CM

## 2020-04-25 DIAGNOSIS — N179 Acute kidney failure, unspecified: Secondary | ICD-10-CM

## 2020-04-25 DIAGNOSIS — N3000 Acute cystitis without hematuria: Secondary | ICD-10-CM

## 2020-04-25 LAB — MANUAL DIFF AND MORPHOLOGY-SYSMEX
BASOPHIL #: <0.1 10*3/uL (ref <=0.20)
BASOPHIL %: 0 %
EOSINOPHIL #: <0.1 10*3/uL (ref <=0.50)
EOSINOPHIL %: 0 %
LYMPHOCYTE #: 1.58 10*3/uL (ref 1.00–4.80)
LYMPHOCYTE %: 17 %
MONOCYTE #: 0.84 10*3/uL (ref 0.20–1.10)
MONOCYTE %: 9 %
NEUTROPHIL #: 6.88 10*3/uL (ref 1.50–7.70)
NEUTROPHIL %: 74 %
RBC MORPHOLOGY: NORMAL

## 2020-04-25 LAB — MAGNESIUM: MAGNESIUM: 2 mg/dL (ref 1.8–2.6)

## 2020-04-25 LAB — BASIC METABOLIC PANEL
ANION GAP: 9 mmol/L (ref 4–13)
BUN/CREA RATIO: 24 — ABNORMAL HIGH (ref 6–22)
BUN: 24 mg/dL (ref 8–25)
CALCIUM: 9.2 mg/dL (ref 8.8–10.2)
CHLORIDE: 98 mmol/L (ref 96–111)
CO2 TOTAL: 26 mmol/L (ref 23–31)
CREATININE: 1 mg/dL (ref 0.75–1.35)
ESTIMATED GFR: 74 mL/min/BSA (ref >=60)
GLUCOSE: 117 mg/dL (ref 65–125)
POTASSIUM: 4.4 mmol/L (ref 3.5–5.1)
SODIUM: 133 mmol/L — ABNORMAL LOW (ref 136–145)

## 2020-04-25 LAB — STERILE SITE CULTURE AND GRAM STAIN, AEROBIC: GRAM STAIN: NONE SEEN

## 2020-04-25 LAB — CBC WITH DIFF
HCT: 33.6 % — ABNORMAL LOW (ref 38.9–52.0)
HGB: 11.2 g/dL — ABNORMAL LOW (ref 13.4–17.5)
MCH: 31.4 pg (ref 26.0–32.0)
MCHC: 33.3 g/dL (ref 31.0–35.5)
MCV: 94.1 fL (ref 78.0–100.0)
MPV: 9.4 fL (ref 8.7–12.5)
PLATELETS: 317 10*3/uL (ref 150–400)
RBC: 3.57 10*6/uL — ABNORMAL LOW (ref 4.50–6.10)
RDW-CV: 14.1 % (ref 11.5–15.5)
WBC: 9.3 10*3/uL (ref 3.7–11.0)

## 2020-04-25 MED ORDER — FAMOTIDINE 20 MG TABLET
20.0000 mg | ORAL_TABLET | Freq: Two times a day (BID) | ORAL | Status: DC
  Administered 2020-04-25 – 2020-04-28 (×6): 20 mg via ORAL
  Filled 2020-04-25 (×7): qty 1

## 2020-04-25 NOTE — Care Management Notes (Addendum)
Patient is agreeable with doing the infusion center for 6 weeks of antibiotics. Will need to notify [facility] for [facility] on Monday.

## 2020-04-25 NOTE — Nurses Notes (Signed)
Pt laying in bed, bed locked and in lowest position, call light in reach, bed alarm audible and activated. pt alert and oriented x4, lung sounds clear in all fields, resp even and unlabored. Active bowel sounds in all quadrant. No c/o n/v. no c/o pain at this time. Iv dressing is clean, dry, intact and locked. Incision dressing intact with old drainage. Bathroom offered and refused. No other needs voiced at this time. All safety measures continued and safety maintained. Will continue to monitor.

## 2020-04-25 NOTE — Progress Notes (Signed)
[facility]  Internal Medicine  Hospitalist -- Progress Note  Name: [name]  Age: 75 y.o.   Gender: male  MRN: [id number]  Date of Admission:  04/19/2020  PCP: [facility]  Attending: Birder Robson, MD  Consultants:     Hospital Day:  LOS: 4 days       INTERVAL HISTORY:  75 year old male with history of hypertension, hyperlipidemia, HIV, GERD who presented to the ER with right groin pain.  Patient noted he had had this for approximately 8 days.  Initially thought it was muscle strain.  Initial CT revealed small hernia.  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  Patient had neurosurgery evaluation without neurosurgical etiology for his pain.  Orthopedics was consulted with recommendation for MRI of the hip.  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  This was followed by right hip arthrotomy irrigation debridement on 04/22/2020.  Infectious Disease was consulted with recommendations for cefepime and daptomycin.  Patient's home HIV therapy of Genvoya was continued.  Urine culture was positive for MSSA.  Antibiotics were changed from cefepime team and daptomycin to Ancef.  Given the MSSA echocardiogram was ordered with no vegetations noted.  Patient's hip pain was markedly improved following the surgery.    Subjective:  Patient notes marked reduction in his right hip pain.  Notes the pain is now moderately uncomfortable.  No other acute complaints.  No fever or chills.    Assessment and Plan:  1. Septic arthritis right hip-will continue Ancef.  Plan is for 6 weeks of antibiotic therapy.     2. HIV-continue Genvoya    3. Hypertension-continue Norvasc    3. Back pain-continue gabapentin    4. Hyperlipidemia continue Lipitor    5. Hypothyroidism-continue Synthroid    6. DVT/GI prophylaxis-continue Lovenox and add Pepcid    7. Discharge planning-when cleared per Orthopedics.  Patient will need outpatient  IV therapy.     8. UTI-continue Ancef    9. History of prostate cancer-follow    10. Pancreatic mass-benign     Intractable pain  Patient Active Problem List   Diagnosis    Chest pain    Near syncope    Acute kidney injury (CMS HCC)    Pancreatic mass    GERD (gastroesophageal reflux disease)    Hypertension    Hypothyroidism    Hyperlipidemia    HIV (human immunodeficiency virus infection) (CMS HCC)    Intractable pain    Acute cystitis without hematuria         Objective:  Filed Vitals:    04/24/20 1642 04/25/20 0011 04/25/20 0513 04/25/20 0812   BP: 125/69 117/76  123/72   Pulse: 82 76  93   Resp: 16 17 18 18    Temp: 36.5 C (97.7 F) 36.5 C (97.7 F)  36.8 C (98.2 F)   SpO2: 90% 92%  94%       Physical Exam:  Patient examined while lying in bed  Constitutional:  appears in good health, appears stated age and no distress   Neck:   supple, symmetrical, trachea midline  Respiratory:  Clear to auscultation bilaterally.   Cardiovascular:  regular rate and rhythm,  no murmur  Gastrointestinal:  Soft, non-tender, Bowel sounds normal, No masses  Musculoskeletal:  Head atraumatic and normocephalic  Integumentary:  Skin warm and dry and No rashes, clean dressing on his right hip  Neurologic:  Grossly normal, CN II - XII grossly intact , Alert and oriented x3  Psychiatric:  Normal affect, behavior, and speech.        I/O last 24 hours:      Intake/Output Summary (Last 24 hours) at 04/25/2020 1136  Last data filed at 04/25/2020 0900  Gross per 24 hour   Intake 420 ml   Output 2250 ml   Net -1830 ml     I/O current shift:  03/26 0700 - 03/26 1859  In: 120 [P.O.:120]  Out: -           Results for orders placed or performed during the hospital encounter of 04/19/20 (from the past 24 hour(s))   CBC/DIFF    Narrative    The following orders were created for panel order CBC/DIFF.  Procedure                               Abnormality         Status                     ---------                               -----------          ------                     CBC WITH YQIH[[id number]]                Abnormal            Final result               MANUAL DIFF AND MORPHOLO.Marland KitchenMarland Kitchen[[id number]]                      Final result                 Please view results for these tests on the individual orders.   BASIC METABOLIC PANEL   Result Value Ref Range    SODIUM 133 (L) 136 - 145 mmol/L    POTASSIUM 4.4 3.5 - 5.1 mmol/L    CHLORIDE 98 96 - 111 mmol/L    CO2 TOTAL 26 23 - 31 mmol/L    ANION GAP 9 4 - 13 mmol/L    CALCIUM 9.2 8.8 - 10.2 mg/dL    GLUCOSE 117 65 - 125 mg/dL    BUN 24 8 - 25 mg/dL    CREATININE 1.00 0.75 - 1.35 mg/dL    BUN/CREA RATIO 24 (H) 6 - 22    ESTIMATED GFR 74 >=60 mL/min/BSA   MAGNESIUM   Result Value Ref Range    MAGNESIUM 2.0 1.8 - 2.6 mg/dL   CBC WITH DIFF   Result Value Ref Range    WBC 9.3 3.7 - 11.0 x103/uL    RBC 3.57 (L) 4.50 - 6.10 x106/uL    HGB 11.2 (L) 13.4 - 17.5 g/dL    HCT 33.6 (L) 38.9 - 52.0 %    MCV 94.1 78.0 - 100.0 fL    MCH 31.4 26.0 - 32.0 pg    MCHC 33.3 31.0 - 35.5 g/dL    RDW-CV 14.1 11.5 - 15.5 %    PLATELETS 317 150 - 400 x103/uL    MPV 9.4 8.7 - 12.5 fL   MANUAL DIFF  AND MORPHOLOGY-SYSMEX   Result Value Ref Range    NEUTROPHIL % 74 %    LYMPHOCYTE %  17 %    MONOCYTE % 9 %    EOSINOPHIL % 0 %    BASOPHIL % 0 %    NEUTROPHIL # 6.88 1.50 - 7.70 x103/uL    LYMPHOCYTE # 1.58 1.00 - 4.80 x103/uL    MONOCYTE # 0.84 0.20 - 1.10 x103/uL    EOSINOPHIL # <0.10 <=0.50 x103/uL    BASOPHIL # <0.10 <=0.20 x103/uL    RBC MORPHOLOGY Normal RBC and PLT Morphology      acetaminophen (TYLENOL) tablet, 500 mg, Oral, Q4H PRN  amLODIPine (NORVASC) tablet, 5 mg, Oral, Daily  atorvastatin (LIPITOR) tablet, 20 mg, Oral, QPM  ceFAZolin (ANCEF) 2 g in iso-osmotic 100 mL premix IVPB, 2 g, Intravenous, Q8H  diphenhydrAMINE (BENADRYL) capsule, 25 mg, Oral, HS PRN  docusate sodium (COLACE) capsule, 100 mg, Oral, 2x/day  elvitegravir-cobicistat-emtricitrabine-tenofovir (GENVOYA) 150-150-200-10 mg per tablet, 1 Tablet, Oral,  Daily  enoxaparin PF (LOVENOX) 40 mg/0.4 mL SubQ injection, 40 mg, Subcutaneous, Q24H  gabapentin (NEURONTIN) capsule, 300 mg, Oral, 3x/day  HYDROcodone-acetaminophen (NORCO) 5-325 mg per tablet, 1 Tablet, Oral, Q4H PRN  iopamidol (ISOVUE-370) 76% infusion, 50 mL, Intravenous, Give in Radiology  levothyroxine (SYNTHROID) tablet, 88 mcg, Oral, QAM  magnesium hydroxide (MILK OF MAGNESIA) 400mg  per 17mL oral liquid, 15 mL, Oral, Daily PRN  melatonin tablet, 10.5 mg, Oral, NIGHTLY  morphine 4 mg/mL injection, 4 mg, Intravenous, Q4H PRN  nalOXone (NARCAN) 1 mg/mL injection, 1 mg, Intravenous, Q2 MIN PRN  NS flush syringe, 3 mL, Intracatheter, Q8HRS  NS flush syringe, 3 mL, Intracatheter, Q1H PRN  NS flush syringe, 10 mL, Intravenous, Q8HRS  NS flush syringe, 20 mL, Intravenous, Q1 MIN PRN  ondansetron (ZOFRAN) 2 mg/mL injection, 4 mg, Intravenous, Q6H PRN                LOS: E&M  21 - [place]  Subsequent  2    Birder Robson, MD

## 2020-04-25 NOTE — Care Plan (Signed)
Patient axox4.  Patient took prn iv pain medication twice this shift and took prn pain pill one time.  Patient walked to bathroom using walker a few times last night.  Patient uses urinal.  Patient dressing to right hip intact and has old drainage on it.  Patient used ice packs last night.  Patient has some swelling in right hip.  Call light in reach.

## 2020-04-25 NOTE — Progress Notes (Signed)
[facility]  Orthopaedic Surgery  Progress Note    Name: KEES IDROVO   Date of Service: 04/25/2020  MRN: [id number]        Subjective:  53 old white male postoperative day 3. Status post open arthrotomy irrigation debridement of septic arthritis the right hip.  Patient notes dramatic improvement in his pain he improves daily is ambulating well with the use of his walker no lower extremity neurological symptoms.  He denies fevers or any constitutional symptoms at this point.  PICC line placed yesterday    Objective:  Dressing clean dry and intact no significant swelling in the leg leg lengths equivalent sciatic nerve intact cultures from right hip fluid positive for Staph coccus aureus    Assessment:  Postop day 3 status post open right hip arthrotomy irrigation debridement septic arthritis right hip    Plan:  Mobilization as tolerated weightbearing as tolerated right lower extremity.  IV antibiotic therapy.  Patient is quite stable at this point has a PICC line in place I see no reason why cannot be discharged home today with IV antibiotic therapy at home    Glennon Hamilton, MD

## 2020-04-25 NOTE — Nurses Notes (Signed)
PATIENT RESTING IN BED.  PATIENT ALERT AND ORIENTED TO PERSON, PLACE, TIME, AND SITUATION.  PATIENT ON ROOM AIR AND DENIES ANY SHORTNESS OF BREATH.  SKIN PINK, WARM, AND DRY.  RESPIRATIONS EVEN AND UNLABORED.  PATIENT ON TELEMETRY MONITOR.  RIGHT HIP DRESSING IS CLEAN, DRY, AND INTACT.  CAPPED PICC LINE IN LEFT UPPER ARM WITHOUT S/S OF COMPLICATIONS AT IV SITE.  SCDS ON BILATERAL LOWER EXTREMITIES.  PATIENT DENIES ANY NEEDS AT THIS TIME.  BED ALARM ACTIVE.  CALL LIGHT IN REACH AND SAFETY MAINTAINED.

## 2020-04-25 NOTE — Care Plan (Signed)
Pt rested in bed most of today. Received PRN pain medication. Pt currently sitting in bed visiting with wife. Call light in reach. All safety measures continued and safety maintanied. Will continue to monitor.

## 2020-04-26 DIAGNOSIS — M549 Dorsalgia, unspecified: Secondary | ICD-10-CM

## 2020-04-26 NOTE — Nurses Notes (Signed)
Patient awake complaint of pain 5/10, prn pain medication given. Dressing dry and intact to right hip with old drainage noted. Call light in reach, no other needs. Fall risk management maintained.

## 2020-04-26 NOTE — Progress Notes (Signed)
[facility]  Internal Medicine  Hospitalist -- Progress Note  Name: [name]  Age: 75 y.o.   Gender: male  MRN: [id number]  Date of Admission:  04/19/2020  PCP: [facility]  Attending: Birder Robson, MD  Consultants:     Hospital Day:  LOS: 5 days       INTERVAL HISTORY:  75 year old male with history of hypertension, hyperlipidemia, HIV, GERD who presented to the ER with right groin pain.  Patient noted he had had this for approximately 8 days.  Initially thought it was muscle strain.  Initial CT revealed small hernia.  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  Patient had neurosurgery evaluation without neurosurgical etiology for his pain.  Orthopedics was consulted with recommendation for MRI of the hip.  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  This was followed by right hip arthrotomy irrigation debridement on 04/22/2020.  Infectious Disease was consulted with recommendations for cefepime and daptomycin.  Patient's home HIV therapy of Genvoya was continued.  Urine culture was positive for MSSA.  Antibiotics were changed from cefepime team and daptomycin to Ancef.  Given the MSSA echocardiogram was ordered with no vegetations noted.  Patient's hip pain was markedly improved following the surgery.    Subjective:  Patient notes continued marked reduction in his right hip pain.  Notes the pain is now adequately controlled.  No other acute complaints.  No fever or chills.    Assessment and Plan:  1. Septic arthritis right hip-will continue Ancef.  Plan is for 6 weeks of antibiotic therapy.  This should be cleared by tomorrow CVA per case management.    2. HIV-continue Genvoya    3. Hypertension-continue Norvasc    3. Back pain-continue gabapentin    4. Hyperlipidemia continue Lipitor    5. Hypothyroidism-continue Synthroid    6. DVT/GI prophylaxis-continue Lovenox and Pepcid    7. Discharge  planning-1 outpatient IV antibiotics can be arranged    8. UTI-continue Ancef    9. History of prostate cancer-follow    10. Pancreatic mass-benign     Intractable pain  Patient Active Problem List   Diagnosis   . Chest pain   . Near syncope   . Acute kidney injury (CMS HCC)   . Pancreatic mass   . GERD (gastroesophageal reflux disease)   . Hypertension   . Hypothyroidism   . Hyperlipidemia   . HIV (human immunodeficiency virus infection) (CMS HCC)   . Intractable pain   . Acute cystitis without hematuria         Objective:  Filed Vitals:    04/25/20 2100 04/26/20 0001 04/26/20 0520 04/26/20 0742   BP:  118/73  126/79   Pulse:  85  77   Resp: 18 18 18     Temp:  36.9 C (98.4 F)  36.5 C (97.7 F)   SpO2:  93%  (!) 89%       Physical Exam:  Patient examined while lying in bed  Constitutional:  appears in good health, appears stated age and no distress   Neck:   supple, symmetrical, trachea midline  Respiratory:  Clear to auscultation bilaterally.   Cardiovascular:  regular rate and rhythm,  no murmur  Gastrointestinal:  Soft, non-tender, Bowel sounds normal, No masses  Musculoskeletal:  Head atraumatic and normocephalic  Integumentary:  Skin warm and dry and No rashes, clean dressing on his right  hip  Neurologic:  Grossly normal, CN II - XII grossly intact , Alert and oriented x3  Psychiatric:  Normal affect, behavior, and speech.        I/O last 24 hours:    No intake or output data in the 24 hours ending 04/26/20 1339  I/O current shift:  No intake/output data recorded.          No results found for this or any previous visit (from the past 24 hour(s)).  acetaminophen (TYLENOL) tablet, 500 mg, Oral, Q4H PRN  amLODIPine (NORVASC) tablet, 5 mg, Oral, Daily  atorvastatin (LIPITOR) tablet, 20 mg, Oral, QPM  ceFAZolin (ANCEF) 2 g in iso-osmotic 100 mL premix IVPB, 2 g, Intravenous, Q8H  diphenhydrAMINE (BENADRYL) capsule, 25 mg, Oral, HS PRN  docusate sodium (COLACE) capsule, 100 mg, Oral,  2x/day  elvitegravir-cobicistat-emtricitrabine-tenofovir (GENVOYA) 150-150-200-10 mg per tablet, 1 Tablet, Oral, Daily  enoxaparin PF (LOVENOX) 40 mg/0.4 mL SubQ injection, 40 mg, Subcutaneous, Q24H  famotidine (PEPCID) tablet, 20 mg, Oral, 2x/day  gabapentin (NEURONTIN) capsule, 300 mg, Oral, 3x/day  HYDROcodone-acetaminophen (NORCO) 5-325 mg per tablet, 1 Tablet, Oral, Q4H PRN  iopamidol (ISOVUE-370) 76% infusion, 50 mL, Intravenous, Give in Radiology  levothyroxine (SYNTHROID) tablet, 88 mcg, Oral, QAM  magnesium hydroxide (MILK OF MAGNESIA) 400mg  per 49mL oral liquid, 15 mL, Oral, Daily PRN  melatonin tablet, 10.5 mg, Oral, NIGHTLY  morphine 4 mg/mL injection, 4 mg, Intravenous, Q4H PRN  nalOXone (NARCAN) 1 mg/mL injection, 1 mg, Intravenous, Q2 MIN PRN  NS flush syringe, 3 mL, Intracatheter, Q8HRS  NS flush syringe, 3 mL, Intracatheter, Q1H PRN  NS flush syringe, 10 mL, Intravenous, Q8HRS  NS flush syringe, 20 mL, Intravenous, Q1 MIN PRN  ondansetron (ZOFRAN) 2 mg/mL injection, 4 mg, Intravenous, Q6H PRN                LOS: E&M  21 - [place]  Subsequent  2    Birder Robson, MD

## 2020-04-26 NOTE — Nurses Notes (Signed)
Pt laying in bed, bed locked and in lowest position, call light in reach, bed alarm audible and activated. pt alert and oriented x4, lung sounds clear in all fields, resp even and unlabored. Active bowel sounds in all quadrant. No c/o n/v.  no c/o pain at this time. Iv dressing is dry and intact and locked. Incision dressing is intact with old drainage. Bathroom offered and refused. No other needs voiced at this time. All safety measures continued and safety maintained. Will continue to monitor.

## 2020-04-26 NOTE — Care Management Notes (Signed)
[facility]  Care Management Note    Patient Name: [name]  Date of Birth: October 21, 1945  Sex: male  Date/Time of Admission: 04/19/2020  9:09 AM  Room/Bed: 415/A  Payor: [facility] [employer] / Plan: [place] [employer] / Product Type: Managed Care /    LOS: 5 days   PCP: [facility]    Admitting Diagnosis:  Intractable pain [R52]    Assessment:      04/26/20 1430   Assessment Details   Assessment Type Continued Assessment   Care Management Plan   Discharge plan discussed with: Patient   Discharge Needs Assessment   Discharge Facility/Level of Care Needs Home with Home Health (code 6)       Discharge Plan:  [place] with Infusion  Spoke with patient and he has changed his mind to go home with home health and infusion instead of going to the infusion center. Patient is unsure of home health agency but wants whoever is best. [facility] will need to be notified and authorization started on Monday.    The patient will continue to be evaluated for developing discharge needs.     Case Manager: April Holding, CASE MANAGER  Phone: [phone]

## 2020-04-26 NOTE — Care Plan (Signed)
Pt rested in bed most of today. Pt hoping to go home tomorrow. Call light in reach. All safety measures continued and safety maintained. Will continue to monitor.

## 2020-04-26 NOTE — Care Plan (Signed)
Problem: Adult Inpatient Plan of Care  Goal: Plan of Care Review  Outcome: Ongoing (see interventions/notes)  Flowsheets (Taken 04/26/2020 0351)  Plan of Care Reviewed With: patient  Progress: no change  Goal: Patient-Specific Goal (Individualized)  Outcome: Ongoing (see interventions/notes)  Flowsheets (Taken 04/26/2020 0351)  Individualized Care Needs: Pain Control     PATIENT RESTING IN BED.  PATIENT ALERT AND ORIENTED TO PERSON, PLACE, TIME, AND SITUATION.  PATIENT ON ROOM AIR AND DENIES ANY SHORTNESS OF BREATH.  SKIN PINK, WARM, AND DRY.  RESPIRATIONS EVEN AND UNLABORED.  PATIENT ON TELEMETRY MONITOR.  RIGHT HIP DRESSING IS INTACT WITH SMALL AMOUNT OF OLD DRAINAGE.  CAPPED PICC LINE IN LEFT UPPER ARM WITHOUT S/S OF COMPLICATIONS AT IV SITE.  SCDS ON BILATERAL LOWER EXTREMITIES.  PATIENT REPORTED RIGHT HIP PAIN THIS SHIFT WHICH WAS CONTROLLED WITH CURRENT PRN PAIN MEDICATION.  BED ALARM ACTIVE.  CALL LIGHT IN REACH AND SAFETY MAINTAINED.

## 2020-04-27 DIAGNOSIS — E782 Mixed hyperlipidemia: Secondary | ICD-10-CM

## 2020-04-27 DIAGNOSIS — M00051 Staphylococcal arthritis, right hip: Secondary | ICD-10-CM

## 2020-04-27 NOTE — Nurses Notes (Signed)
Patient A/O x4. RR even and unlabored on RA. Reports no pain or SOB. Left PICC WDL and flushes w/o difficulty. Right hip incision clean, dry, and intact. Fall/safety precautions in place. Call light within reach. No further needs expressed at this time.  Rick Duff, RN  04/27/2020, 08:00

## 2020-04-27 NOTE — Care Management Notes (Signed)
Called the [facility] to ensure she has received the fax on the atb order and she looked on her fax and found the order that i had sent. She will start working on an [place] for patient along with [facility] for [facility] authorization.

## 2020-04-27 NOTE — Care Management Notes (Signed)
[facility] called and is waiting on auth through the [place] for [facility] approval. They do think they can accept .

## 2020-04-27 NOTE — Progress Notes (Signed)
[facility]                                     INPATIENT PROGRESS NOTE      Morgon, Pamer, 75 y.o. male  Date of Birth:  1945/12/30  Date of Admission:  04/19/2020  Date of service: 04/27/2020        Hospital Day:  LOS: 6 days     Brief history:     The patient is a 75 years old male with medical history significant for hypertension, hyperlipidemia, hypothyroidism GERD, benign pancreatic mass, history of prostate cancer and HIV infection, was admitted to the hospital with septic arthritis of right hip, when he presented to the ED with severe right groin pain    Subjective:  Patient was seen and examined today, his wife by bedside.  No acute events overnight.  Patient reports his right hip surgical pain is improving able to walk to the bathroom with PT.  he is awaiting to do more walking with PT.  Denies any fever or chills.    ROS:       14 point review of systems was performed and found to be unremarkable apart from what is mentioned in subjective      Vital Signs:  Temperature: 36.3 C (97.3 F)  Heart Rate: 75  BP (Non-Invasive): 122/80  Respiratory Rate: 16  SpO2: 91 %  Liter Flow (L/Min): 2    Intake & Output:    Intake/Output Summary (Last 24 hours) at 04/27/2020 1003  Last data filed at 04/27/2020 0400  Gross per 24 hour   Intake 480 ml   Output 750 ml   Net -270 ml     I/O current shift:  No intake/output data recorded.  Emesis:    BM:    Last Bowel Movement: 04/25/20  Heme:        PHYSICAL EXAM:  VITALS: BP 122/80    Pulse 75    Temp 36.3 C (97.3 F)    Resp 16    Ht 1.778 m (5\' 10" )    Wt 79.4 kg (175 lb)    SpO2 91%    BMI 25.11 kg/m     GENERAL:   Well-developed.  Alert, not in acute pain  HEENT:   Normocephalic. PERRLA. EOMI. Moist mucosa  Neck:    Supple.  Midline trachea.  No JVD/lad, Masses  CVS:    RRR.  S1+S2.  Pulses present. No pedal edema   LUNGS:   Breathing is unlabored. Chest CTA bilaterally.    GI:    +BS. Soft.  Non-distended. Non-tender. No masses  MSK:   + Spontaneous ROM.  No joint abnormality  SKIN:    No erythema, rashes, lesions or ulcers noted  NEURO:   A/O x4. CN, Motor and sensory grossly non-focal  PSYCH:   Alert. Apropriate mood and affect      Current Medications:  acetaminophen (TYLENOL) tablet, 500 mg, Oral, Q4H PRN  amLODIPine (NORVASC) tablet, 5 mg, Oral, Daily  atorvastatin (LIPITOR) tablet, 20 mg, Oral, QPM  ceFAZolin (ANCEF) 2 g in iso-osmotic 100 mL premix IVPB, 2 g, Intravenous, Q8H  diphenhydrAMINE (BENADRYL) capsule, 25 mg, Oral, HS PRN  docusate sodium (COLACE) capsule, 100 mg, Oral, 2x/day  elvitegravir-cobicistat-emtricitrabine-tenofovir (GENVOYA) 150-150-200-10 mg per tablet, 1 Tablet, Oral, Daily  enoxaparin PF (LOVENOX) 40 mg/0.4 mL SubQ injection, 40 mg, Subcutaneous, Q24H  famotidine (PEPCID) tablet, 20 mg, Oral, 2x/day  gabapentin (NEURONTIN) capsule, 300 mg, Oral, 3x/day  HYDROcodone-acetaminophen (NORCO) 5-325 mg per tablet, 1 Tablet, Oral, Q4H PRN  iopamidol (ISOVUE-370) 76% infusion, 50 mL, Intravenous, Give in Radiology  levothyroxine (SYNTHROID) tablet, 88 mcg, Oral, QAM  magnesium hydroxide (MILK OF MAGNESIA) 400mg  per 74mL oral liquid, 15 mL, Oral, Daily PRN  melatonin tablet, 10.5 mg, Oral, NIGHTLY  morphine 4 mg/mL injection, 4 mg, Intravenous, Q4H PRN  nalOXone (NARCAN) 1 mg/mL injection, 1 mg, Intravenous, Q2 MIN PRN  NS flush syringe, 3 mL, Intracatheter, Q8HRS  NS flush syringe, 3 mL, Intracatheter, Q1H PRN  NS flush syringe, 10 mL, Intravenous, Q8HRS  NS flush syringe, 20 mL, Intravenous, Q1 MIN PRN  ondansetron (ZOFRAN) 2 mg/mL injection, 4 mg, Intravenous, Q6H PRN        No Known Allergies      LABs as Reviewed:         CBC Results Differential Results   Recent Labs     04/25/20  0404   WBC 9.3   HGB 11.2*   HCT 33.6*    Recent Labs     04/25/20  0404   PMNS 74   LYMPHOCYTES 17   MONOCYTES 9   EOSINOPHIL 0   BASOPHILS 0   <0.10   PMNABS 6.88   MONOSABS 0.84   EOSABS <0.10       BMP Results Other Chemistries Results   Recent Labs     04/25/20  0404   SODIUM 133*   POTASSIUM 4.4   CHLORIDE 98   CO2 26   BUN 24   CREATININE 1.00   GFR 74   ANIONGAP 9    Recent Labs     04/25/20  0404   CALCIUM 9.2   MAGNESIUM 2.0      Liver/Pancreas Enzyme Results Blood Gas     No results for input(s): TOTALPROTEIN, ALBUMIN, PREALBUMIN, AST, ALT, ALKPHOS, LDH, AMYLASE, LIPASE in the last 72 hours.    Invalid input(s): GGT No results found for this encounter   Cardiac Results    Coags Results     No results for input(s): UHCEASTTROPI, CKMB, MBINDEX, BNP in the last 72 hours. No results for input(s): INR in the last 72 hours.    Invalid input(s): PTT, PT     Hospital Encounter on 04/19/20 (from the past 96 hour(s))   ADULT ROUTINE BLOOD CULTURE, SET OF 2 ADULT BOTTLES (BACTERIA AND YEAST)    Collection Time: 04/23/20  2:47 PM    Specimen: Blood   Culture Result Status    BLOOD CULTURE, ROUTINE No Growth 3 Days Preliminary   ADULT ROUTINE BLOOD CULTURE, SET OF 2 ADULT BOTTLES (BACTERIA AND YEAST)    Collection Time: 04/23/20  2:47 PM    Specimen: Blood   Culture Result Status    BLOOD CULTURE, ROUTINE No Growth 3 Days Preliminary         Diagnostic studies:  TRANSTHORACIC ECHOCARDIOGRAM - ADULT    Result Date: 04/23/2020  **See full report in linked PDF document**                                                           [facility] [address]. [address], [place], [place] [address]  Transthoracic Echocardiographic Report           ______________________________________________________________________________ Name: JUELL, RADNEY                   MRN: [id number]               Weight: 175 lb Study Date: 04/23/2020 02:16 PM             DOB: Jun 14, 1945             Height: 70 in Gender: Male                                Age: 26 yrs                 BSA: 2.0 m2 Accession #: [id number] Patient Location: CCM  ECHO CCM Ordering Provider: Alinda Money Tech: JS           ______________________________________________________________________________ Procedure:  Limited Echocardiogram, Transthoracic, 2D, includes M-mode recording, when performed, limited study. Quality:  Technically difficult study. Indications: Rule out endocarditis Conclusions: Technically difficult and limited study. No diagnostic valvular vegetation is identified on this technically difficult and limited study. Doppler interrogation was not performed Left ventricular systolic function appears normal although the study is not adequate for EF determination or wall motion assessment. The aortic root appears normal size. The proximal ascending aorta is not optimally visualized and color Doppler was not performed Findings Electronically signed by: MD Cecilie Kicks on 04/23/2020 04:32 PM     MRI SPINE LUMBOSACRAL W/WO CONTRAST    Result Date: 04/20/2020  Male, 75 years old. MRI SPINE LUMBOSACRAL W/WO CONTRAST performed on 04/20/2020 1:57 PM. REASON FOR EXAM:  Right Sciatica INTRAVENOUS CONTRAST: 13 ml's of Multihance TECHNIQUE: Multisequence multiplanar MR lumbosacral spine without and with gadolinium. COMPARISON: MRI lumbosacral spine of 11/29/2018. FINDINGS: Preserved lumbar lordosis. No acute fracture or traumatic malalignment. Cord and conus are normal in signal and position. T12-L1: No high-grade spinal canal or neural foraminal stenosis. L1-L2: Right paracentral disc protrusion. No spinal canal or neural foraminal stenosis. L2-L3: No spinal canal or neural foraminal stenosis. L3-L4: Posterior disc bulge. No spinal canal or neural foraminal stenosis. L4-L5: Posterior disc bulge. Flattening of lateral recesses. No spinal canal stenosis. Mild left neural foraminal stenosis. L5-S1: Mild posterior disc bulge. No spinal canal stenosis. Mild left neural foraminal stenosis. Included superior joints and straight degenerative changes.     1.Lumbosacral  spondylosis without spinal canal stenosis. 2.Mild left L4-L5 and left L5-S1 neural foraminal stenosis. Radiologist location ID: [id number]     US SCROTUM    Result Date: 04/19/2020  Male, 75 years old. US SCROTUM performed on 04/19/2020 3:03 PM. REASON FOR EXAM:  Right testicular pain FINDINGS: Ultrasound evaluation of the scrotum/testicles was performed using grayscale and color Doppler imaging the bilateral testicles are within normal limits size configuration. No solid or cystic testicular lesion is seen. Blood flow is documented to each testicle. There was no hydrocele or varicocele seen on either side. Epididymal regions are unremarkable.     Unremarkable ultrasound of the scrotum/testicles. Radiologist location ID: [id number]     CT ABDOMEN PELVIS W IV CONTRAST    Result Date: 04/19/2020  Male, 75 years old. CT ABDOMEN PELVIS W IV CONTRAST performed on 04/19/2020 12:46 PM. REASON FOR EXAM:  Right groin pain CT Dose:  1465 DLP (mGy*cm) This CT scanner is equipped with dose reducing technology. The mAs is automatically adjusted  to patient's body size in order to deliver the lowest dose possible. CONTRAST: 100 ml's of Isovue 370 Findings: Axial postcontrast CT imaging of the abdomen and pelvis was performed along with sagittal and coronal reformats. No prior studies are available for direct comparison. The included lung bases show some mild consolidative atelectasis and likely trace pleural effusions. There is a 5.5 x 6 x 5 cm multiloculated heterogeneous mass involving the tail the pancreas. There are further couple small cystic areas noted at the head of the pancreas. There is no evidence of surrounding inflammation or fluid. There is no peripancreatic adenopathy. The liver shows a few simple appearing cysts. However there are also couple small hypodensities that are too small to further characterize. The gallbladder is mildly distended but no stone, sludge, wall thickening or surrounding inflammation is detected.  There is no filling defect within the common duct which is smoothly marginated. The spleen, adrenal glands and kidneys show no acute process. Small cysts are seen at each kidney. The delayed sequence shows prompt excretion of contrast material bilaterally. The small and large bowel show no obstructive process. There is a moderate amount of stool throughout the colon. No focal bowel wall thickening or surrounding inflammation is detected. There is no free fluid or air. Urinary bladder is distended symmetrically. Patient is status post prostatectomy. There is no free fluid or air. Patient does appear to have a right femoral hernia with fat extending along the common femoral vein and artery. There is a small fat-containing left inguinal hernia. Bony structures are intact.     1. 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 GI consult would be recommended for potential scope/ERCP and washings. No definite evidence to suggest distal metastasis is seen. 2. There are some hypodensities involving the liver with the larger likely representing cysts although there are scattered smaller areas that are too small to further characterize. 3. There is fatty density along the right femoral vascular structures likely representing a small femoral hernia. Patient also has a fat-containing left inguinal hernia which is small. 4. Cystic changes are noted at the kidneys. 5. Mild bibasilar atelectasis and cardiomegaly are noted. Radiologist location ID: [id number]     CT FEMUR / THIGH RIGHT W IV CONTRAST    Result Date: 04/19/2020  Male, 75 years old. CT FEMUR / THIGH RIGHT W IV CONTRAST performed on 04/19/2020 5:11 PM. REASON FOR EXAM:  Intractable thigh/ leg pain RADIATION DOSE: 498 DLP CONTRAST: 50 ml's of Isovue 370 TECHNIQUE: Intravenous  contrast utilized for study. Volumetric acquisition of the right femur/thigh. This CT scanner is equipped with dose reducing technology. The exposure is automatically adjusted according to patient body size in order to deliver the lowest dose possible. COMPARISON: None available. FINDINGS: No acute fracture. If the areas of calcified plaque involving the right common femoral artery. Otherwise, the imaged right common and superficial femoral arteries are patent, as is the visualized right popliteal artery. There is nonspecific subcutaneous edema about the knee and the visualized right lower leg. No acute abnormalities within the included pelvic organs.     1.Nonspecific subcutaneous edema/stranding about the right knee and visualized right lower leg. 2.Right common femoral, superficial femoral, and visualized popliteal arteries are patent. Radiologist location ID: [id number]     MRI HIP RIGHT WO CONTRAST    Result Date: 04/21/2020  Male,  75 years old. MRI HIP RIGHT WO CONTRAST performed on 04/21/2020 2:58 PM. REASON FOR EXAM:  SEVERE HIP PAIN, IRRITABLE HIP ON EXAM , LOW GRADE FEVER TECHNIQUE: Multisequence multiplanar MRI right hip without gadolinium. COMPARISON: Right femur/thigh CT of 04/19/2020. FINDINGS:  Moderate size right hip joint effusion. Pericapsular edema throughout the right hip soft tissues. Diffuse nonspecific edema like signal involving the right adductor muscles as well as the right gluteal, hamstrings, and quadriceps muscle groups about the right hip. Fascial edema extends to the superficial fascia. Subcutaneous edema involving the right lateral hip. No acute fracture. No femoral head AVN. No infiltrating abnormal marrow signal. Subchondral reactive marrow signal changes involving the weightbearing aspect of the right acetabulum, likely degenerative in etiology. Mild nonspecific edema like signal involving the right peritrochanteric soft tissues.     Moderate size right hip joint effusion with diffuse  edema like signal throughout the right hip musculature including the adductor, gluteal, quadriceps, and hamstrings groups. Findings concerning for right hip septic arthritis with reactive versus infectious myositis throughout the right hip musculature. Radiologist location ID: [id number]     PERIPHERAL VENOUS DUPLEX - LOWER    Result Date: 04/19/2020  EXAM: PERIPHERAL VENOUS DUPLEX - LOWER HISTORY: M79.606: Leg pain Spectral analysis of, grayscale, and color flow imaging of the venous system of the right lower extremity is performed. Normal flow is seen within the common femoral, superficial femoral, and popliteal veins. These show good augmentation and compressibility. There is no evidence for deep venous thrombosis. The upper calf veins, if visualized, appear normal.     1. There is no evidence for deep venous thrombosis within the right lower extremity. Radiologist location ID: [id number]     XR HIP RIGHT W PELVIS 2-3 VIEWS    Result Date: 04/19/2020  Male, 75 years old. XR HIP RIGHT W PELVIS 2-3 VIEWS performed on 04/19/2020 10:16 AM. REASON FOR EXAM:  Right hip/ groin pain FINDINGS: 2 views right hip are submitted for interpretation.     1. Patient is osteopenic. 2. There are mild degenerative changes without displaced fracture or dislocation. 3. Visualized bowel pattern shows a moderate to large amount of stool. 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  Patient is on Ancef and ID recommending 6 weeks  of treatment last day 06/03/2020.  Case management is working for [employer] and home antibiotic approval through [place] system    Acute cystitis without hematuria:  Urine culture is growing MSSA.  On Ancef as above    Essential hypertension:  Continued on home Norvasc    Mixed hyperlipidemia:  Continued on home Lipitor    Acquired hypothyroidism:  Continued on home Synthroid    GERD without esophagitis:  Continued on home PPI    HIV infection (CMS [place]):  Continued on Genvoya  Paulina Fusi, MD      This note may have been partially generated using M [place], and there may be some incorrect words, spellings, and punctuation that were not noted in checking the note before saving

## 2020-04-27 NOTE — Care Management Notes (Signed)
Spoke to [place] at [facility] and Jeannie at [place], they dont believe they will have an auth back today to start the patient this evening. Will update team. Also [facility] the [facility] order [facility] walker since [facility] denied, it has to be through the patients VA benefits.

## 2020-04-27 NOTE — Care Management Notes (Signed)
[facility]  Care Management Note    Patient Name: [name]  Date of Birth: 1945-11-17  Sex: male  Date/Time of Admission: 04/19/2020  9:09 AM  Room/Bed: 415/A  Payor: [facility] COMM[facility] [facility] / Plan: [place] [facility]AC[facility]/OPTUM / Product Type: Managed Care /    LOS: 6 days   Primary Care Providers:  Varnell (General)    Admitting Diagnosis:  Intractable pain [R52]    Assessment:      04/27/20 1417   Assessment Details   Assessment Type Continued Assessment   Date of Care Management Update 04/27/20   Care Management Plan   Discharge Planning Status plan in progress   Projected Discharge Date 04/27/20   Discharge plan discussed with: Patient   Discharge Needs Assessment   Equipment Needed After Discharge walker, rolling   Discharge Facility/Level of Care Needs Home with Home Health, Home Infusion, and DME   Transportation Available family or friend will provide       Discharge Plan:  Home with [facility] and DME (code 6)  Spoke to patient in depth now multiple times today. Patient was not seen by CM last week he reports. He is very upset about this. I told him i would inform my supervisor. Patient requires IV ATB ancef for 6 weeks, end date 5/4. Patient has chosen to do at home infusions with [facility], [place], and a  walker. All orders have been signed and referrals have been sent in allscripts. I did fax the order to the [place] for approval for the TB.     The patient will continue to be evaluated for developing discharge needs.     Case Manager: Cyd Silence, CASE MANAGER  Phone: [phone]

## 2020-04-27 NOTE — Progress Notes (Signed)
[facility]  Infectious Disease Consult  Follow Up Note    [name], [name], 75 y.o. male  Date of Service: 04/27/2020  Date of Birth:  [birth year]    Hospital Day:  LOS: 6 days     [name] is a 75 y.o., White male with a past medical history significant forhypertension, hyperlipidemia, hypothyroidism, HIV, and GERDwho presented to the emergency department with right groin pain that has progressively worsened since 3/21. The patient seems to have pain in his hip groin that radiates all the way down his leg to his foot. He denies any associated neurological symptoms. His pain is severe he is really unable to move his right leg or his right hip. States that the pain initially improved with ice/heat/and Ben-Gay,but progressively worsened over the past week. Does follow with a pain clinic in [place] where he received steroid injections for uncontrolled back pain.Patient states she had a low-grade fever upon his 1st admission to the hospital. He has been seen by Neurosurgery due to this sounding like radicular type situation. There is nothing on his MRI scan correlates to a right lumbar radiculopathy. 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 Patient also reports night sweats and urinary urgency over the past 2-3 weeks. Denies hematuria or suprapubic pain. Denies subjective fever or chills, nausea, vomiting, or diarrhea      Physical Exam  BP 122/80   Pulse 75   Temp 36.3 C (97.3 F)   Resp 16   Ht 1.778 m (_0 )   Wt 79.4 kg (175 lb)   SpO2 91%   BMI 25.11 kg/m     General:   75 y.o. male appearing stated age.  Well appearing.  No acute distress.  Head:  Normocephalic and atraumatic.  ENT:  Membranes are moist.  Oropharynx free of erythema, exudates and thrush.  Neck:  Supple with full range of motion trachea is midline. No lymphadenopathy.  Respiratory:  Clear to auscultation bilaterally. No wheezes, rales or rhonchi.  Cardiovascular:  Regular  rate and rhythm. No murmurs, rubs or gallops.    Abdomen:  Soft, nontender and nondistended.  Bowel sounds x4.    Extremities:  2+ pedal and radial pulses palpated.  No clubbing, cyanosis or edema.   Musculoskeletal :  Bone right hip tenderness erythema decreased range of motion    Labs: CBC with Diff (Last 48 Hours):    No results for input(s): WBC, HGB, HCT, MCV, PLTCNT, BANDS, PMNS, LYMPHO, LYMPHOCYTES, MONOCYTES, EOSINO, EOSINOPHIL, BASOPHILS in the last 48 hours.      Lab Results   Component Value Date    ESR 122 (H) 04/21/2020         Microbiology:   Hospital Encounter on 04/19/20 (from the past 96 hour(s))   ADULT ROUTINE BLOOD CULTURE, SET OF 2 ADULT BOTTLES (BACTERIA AND YEAST)    Collection Time: 04/23/20  2:47 PM    Specimen: Blood   Culture Result Status    BLOOD CULTURE, ROUTINE No Growth 3 Days Preliminary   ADULT ROUTINE BLOOD CULTURE, SET OF 2 ADULT BOTTLES (BACTERIA AND YEAST)    Collection Time: 04/23/20  2:47 PM    Specimen: Blood   Culture Result Status    BLOOD CULTURE, ROUTINE No Growth 3 Days Preliminary       Imaging Studies:         Assessment/Recommendations:  Septic right hip joint  HIV on Genvoya well controlled treatment by [employer]  [facility] Infectious Disease.  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                Diagnosed pseudocyst,follows at the [place] in [place]  US Scrotum: unremarkable  MRI right hip: moderate size hip joint effusion with diffuse edema, concerning for right hip septic arthritis with reactive versus infectious myositis throughout the right hip  musculature  MRI lumbar: Lumbosacral spondylosis, mild left L4-L5 and left L5-S1 neural foraminal stenosis  Peripheral venous duplex: negative for DVT  UA: 51-100 WBC  Urine culture >100 MSSA  Ortho consulted, [place]: open I&D of the right hip via posterior approach 3/23  Fluid cell count WBC 500; RBC 684000; Seg 91%  Patient was taken to OR by Orthopedics underwent incision and drainage   1/2 sterile site culture growing MSSA preliminarily   Urine culture also growing MSSA suspect patient might be bacteremic and spilling in the urine  Blood cultures with no growth, obtained following initiation of antibiotics, low yield.   TTE: Technically difficult and limited study, no vegetations noted    Plan for 6 weeks Ancef from date of I&D  End date of May 4th, 2022.   Patient will need CBC, CRP, ESR weekly   Follow-up in the office in 3 weeks.     [name] Florida, PA

## 2020-04-27 NOTE — Nurses Notes (Signed)
Patient resting in bed. Alert and oriented on room air. No complaints of pain at this time. PICC to LUA; site WDL. Dressing to right hip intact with small shadow drainage. Refusing SCD's. Needs met. Call light in reach. Bedside table in reach. Safety maintained. Will continue to monitor throughout shift.

## 2020-04-27 NOTE — Care Plan (Signed)
Patient slept off and on throughout the night. PICC patent and capped. No needs noted, call light in reach, fall risk management maintained.    Problem: Adult Inpatient Plan of Care  Goal: Plan of Care Review  Outcome: Ongoing (see interventions/notes)  Flowsheets (Taken 04/27/2020 0441)  Plan of Care Reviewed With: patient  Progress: improving  Goal: Patient-Specific Goal (Individualized)  Outcome: Ongoing (see interventions/notes)  Goal: Absence of Hospital-Acquired Illness or Injury  Outcome: Ongoing (see interventions/notes)  Goal: Optimal Comfort and Wellbeing  Outcome: Ongoing (see interventions/notes)     Problem: Fall Injury Risk  Goal: Absence of Fall and Fall-Related Injury  Outcome: Ongoing (see interventions/notes)     Problem: Pain Acute  Goal: Acceptable Pain Control and Functional Ability  Outcome: Ongoing (see interventions/notes)     Problem: Fall Injury Risk  Goal: Absence of Fall and Fall-Related Injury  Outcome: Ongoing (see interventions/notes)

## 2020-04-27 NOTE — Care Plan (Signed)
[facility]  Physical Therapy Progress Note      Patient Name: [name]  Date of Birth: 10-03-1945  Height:  177.8 cm (5\' 10" )  Weight:  79.4 kg (175 lb)  Room/Bed: 415/A  Payor: [facility] / Plan: [place] [facility] / Product Type: Managed Care /     Assessment:      Patient tolerated visit fair, patient limited in functional mobility secondary to weakness in RLE, balance impairment and right hip pain. Patient required SBA during bed mobility, CGA for transfers, ambulation and stairs. Patient to benefit from skilled PT services to increase activity tolerance, decrease risk for falls and improve functional mobility.       04/27/20 1150   Rehab Session   Document Type therapy progress note (daily note)   Basic Mobility Am-PAC/6Clicks Score (APPROVED PT Staff and [place]   Turning in bed without bedrails 3   Lying on back to sitting on edge of flat bed 3   Moving to and from a bed to a chair 3   Standing up from chair 3   Walk in room 3   Climbing 3-5 steps with railing 3   6 Clicks Raw Score total 18   Standardized (t-scale) score 41.05   CMS 0-100% Score 40.47   CMS Modifier CK       Plan:   Continue to follow patient according to established plan of care.  The risks/benefits of therapy have been discussed with the patient/caregiver and he/she is in agreement with the established plan of care.     Subjective & Objective:     S: Patient lying supine in bed upon PT arrival, patient cleared for activity by RN. Patient in room 415, identified by name, DOB and wrist band. Patient spouse present during visit.     O: Patient performed supine to sitting at EOB with SBA, maintained static sitting at EOB independently. Patient and spouse reporting concerns about performed steps at home. Patient agreeable to ambulate and practice stairs with PT. Patient performed sit to standing from EOB with SBA/CGA and FWW, patient performed ambulation 200 ft with CGA  and FWW, verbal cues for erect posture, standing within walker and ground clearance. Patient performed 4 steps ascending and descending with left hand rail and SC. Patient required verbal cues for sequencing. Patient returned to sitting at EOB, patient and spouse reporting feeling better about returning home and performing stairs. Patient left supine in bed with spouse present, call light and phone within reach.     Therapist:   Noralee Chars, [place]  [id number]    Start Time: 1587  End Time: 1150  AM Treatment Time: 15 minutes    Total Treatment Time: 15 minutes  Charges Entered: GT  Department Number: [id number]

## 2020-04-27 NOTE — Care Management Notes (Signed)
RN notified me that patient wants to speak with me, went to speak with patient. He needs HH services and IV ATB set up through the [facility] [place] along with a walker that needs approved. Orders have been pended for all the above, awaiting for signatures.

## 2020-04-27 NOTE — Care Plan (Signed)
[facility] Plan Note    Situation: Right hip    Intervention: pain medication provided PRN, fall/safety precautions, PICC care, wound/incision care, PT/OT      Rick Duff, RN

## 2020-04-27 NOTE — Care Management Notes (Signed)
Clerk notified me patient wants to speak with me again, currently on the phone with the [facility] for patient speaking to [facility] to get medication, [place] all on the same page for patient to be  DC with Authorizations for all the above. Janeece Riggers reports patient is calling staff members inappropriate names.

## 2020-04-28 DIAGNOSIS — B20 Human immunodeficiency virus [HIV] disease: Secondary | ICD-10-CM

## 2020-04-28 LAB — ADULT ROUTINE BLOOD CULTURE, SET OF 2 BOTTLES (BACTERIA AND YEAST)
BLOOD CULTURE, ROUTINE: NO GROWTH
BLOOD CULTURE, ROUTINE: NO GROWTH

## 2020-04-28 MED ORDER — HYDROCODONE 5 MG-ACETAMINOPHEN 325 MG TABLET
1.0000 | ORAL_TABLET | ORAL | 0 refills | Status: DC | PRN
Start: 2020-04-28 — End: 2021-01-02

## 2020-04-28 MED ORDER — CEFAZOLIN 2 GRAM/100 ML IN DEXTROSE(ISO-OSMOTIC) INTRAVENOUS PIGGYBACK
2.0000 g | INJECTION | Freq: Three times a day (TID) | INTRAVENOUS | 0 refills | Status: AC
Start: 2020-04-28 — End: 2020-06-03

## 2020-04-28 NOTE — Care Plan (Signed)
[facility]    Patient name: [name]  Date of Birth: 1945/03/29  Room/Bed: 415/A    Physical Therapy Contact Note    Pt refuse therapy this date secondary to just getting back from walking up and down hallways.     Marcy Panning, PTA [phone]

## 2020-04-28 NOTE — Care Plan (Addendum)
Patient had no issues overnight. Pain meds given x2 during shift. Ambulating with walker and standby assist in room. Antibiotics given as ordered. Dressing to right hip intact with small amount of shadow drainage. PICC to LUA; WDL. Safety and comfort maintained.  Missey Hasley RN    Problem: Adult Inpatient Plan of Care  Goal: Plan of Care Review  Outcome: Ongoing (see interventions/notes)  Flowsheets (Taken 04/28/2020 0735)  Plan of Care Reviewed With: patient  Outcome Summary:  . not needing pain medication as often  . pain controlled  Progress: improving  Goal: Patient-Specific Goal (Individualized)  Outcome: Ongoing (see interventions/notes)  Flowsheets (Taken 04/28/2020 0735)  Patient-Specific Goals (Include Timeframe): to go home today

## 2020-04-28 NOTE — Care Management Notes (Signed)
Spoke with pt regarding d/c planning update pt on getting auth via the [facility] [place] and pt voiced understanding.

## 2020-04-28 NOTE — Discharge Summary (Signed)
[facility]  DISCHARGE SUMMARY      PATIENT NAME:  [name], [name]  MRN:  [id number]  DOB:  Nov 19, 1945    ENCOUNTER DATE:  04/19/2020  INPATIENT ADMISSION DATE: 04/21/2020  DISCHARGE DATE:  04/28/2020    ATTENDING PHYSICIAN: Paulina Fusi, MD  SERVICE: CCM HOSPITALIST  PRIMARY CARE PHYSICIAN: [place]     Reason for Admission     Diagnosis    Intractable pain [5573220]          Principal Problem:  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[facility])    Active Hospital Problems    Diagnosis Date Noted    Principle Problem: 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[facility]) [M00.051] 04/19/2020    Acute cystitis without hematuria [N30.00] 04/19/2020    Essential hypertension [I10] 03/24/2020    Mixed hyperlipidemia [E78.2] 03/24/2020    Acquired hypothyroidism [E03.9] 03/24/2020    GERD without esophagitis [K21.9] 03/24/2020    HIV infection (CMS [place]) [B20] 03/24/2020      Resolved Hospital Problems   No resolved problems to display.     Active Non-Hospital Problems    Diagnosis Date Noted    Chest pain 03/24/2020    Near syncope 03/24/2020    Acute kidney injury (CMS [place]) 03/24/2020    Pancreatic mass 03/24/2020      No Known Allergies         DISCHARGE MEDICATIONS:     Current Discharge Medication List      START taking these medications.      Details   ceFAZolin in iso-osmotic 2 gram/100 mL Piggyback  Commonly known as: ANCEF   2 g, Intravenous, EVERY 8 HOURS, Last day 06/03/20  Qty: 10800 mL  Refills: 0     HYDROcodone-acetaminophen 5-325 mg Tablet  Commonly known as: NORCO   1 Tablet, Oral, EVERY 4 HOURS PRN  Qty: 15 Tablet  Refills: 0        CONTINUE these medications - NO CHANGES were made during your visit.      Details   AMLODIPINE ORAL   5 mg, Oral, DAILY  Refills: 0     atorvastatin 80 mg Tablet  Commonly known as: LIPITOR   80 mg, Oral, EVERY EVENING, Take one half pill every morning with breakfast  Refills: 0     cholecalciferol (vitamin D3) 25 mcg (1,000 unit) Tablet   1,000  Units, Oral, DAILY  Refills: 0     docusate sodium 100 mg Capsule  Commonly known as: COLACE   100 mg, Oral, 2 TIMES DAILY  Refills: 0     Fish OiL 1,000 mg (120 mg-180 mg) Capsule  Generic drug: omega-3-DHA-EPA-fish oil   Oral, DAILY  Refills: 0     gabapentin 300 mg Capsule  Commonly known as: NEURONTIN   1 Tablet, Oral, 3 TIMES DAILY  Refills: 0     Genvoya 150-150-200-10 mg Tablet  Generic drug: elviteg-cob-emtri-tenof ALAFEN   EVERY HOUR  Refills: 0     levothyroxine 88 mcg Tablet  Commonly known as: SYNTHROID   88 mcg, Oral, EVERY MORNING  Refills: 0     melatonin 10 mg Capsule   Oral  Refills: 0     omeprazole 20 mg Capsule, Delayed Release(E.C.)  Commonly known as: PRILOSEC   20 mg, Oral, 2 TIMES DAILY  Refills: 0        STOP taking these medications.    Allergy Relief (cetirizine) 10 mg Tablet  Generic drug: cetirizine  Discharge med list refreshed?  YES        DISCHARGE INSTRUCTIONS:   Follow-up Information     Glennon Hamilton, MD In 7 days.    Specialty: ORTHOPAEDIC SURGERY  Why: For wound re-check  Contact information:  [address]  [place] [address]  [place] In 1 week.    Specialty: EXTERNAL  Contact information:  [address]  [place] [address]  [phone]                          Refer to [facility] [place] - DIET     Diet: RESUME HOME DIET      DISCHARGE INSTRUCTION - ACTIVITY AS TOLERATED     Activity: AS TOLERATED      DME - [place]    Please note - If patient is 300 lbs or greater please order bariatric or heavy duty items.     Ht 177.8 cm    Wt 79.38 kg    Current Attending: Sheryle Hail    Medical Condition or Diagnosis which is primary reason for equipment: Staph R hip    Patient has mobility limits that significantly impairs ability to participate in one or more mobility related ADL's (MRADL's): Yes    Moblity Limitations: Pt at heightened risk of injury r/t attempts to fulfill MRADL's &  can safely use walker which resolves issue    Walker Type: [employer]    Freedom of Choice: I have informed patient of their freedom of choice with respect to DME providers      DME - INFUSION AND LINE CARE ORDERS     Contact information for lab follow up Specify Provider and Psychologist, counselling    Provider and Contact Info: [place]    Freedom of Choice: I have informed patient of their freedom of choice with respect to DME providers    Type of Line PICC    Line Flushes and Dressing Change Per [place] Protocol YES    Name of Medication/Infusion, Dose, Route, Frequency, Course Duration ceFAZolin (ANCEF) 2 g in iso-osmotic 100 mL premix IVPB q8 hours. end date 06/03/20    Please draw the following labs Other -  AST, ALT, ALK PHOS, TOTAL BILI (weekly) [place] (Include name, phone & fax #s)    Plan association [place] (Include name, phone & fax #s)           [place]:  This is a 75 y.o., male with PMH significant for hypertension, hyperlipidemia, hypothyroidism, HIV, and GERD who presented to the emergency department with right groin pain that has progressively worsened since Monday.  Patient states that last Saturday (8 days ago), he picked up a case of soda and tea and noticed that he pulled something in his right groin but he was able to continue with his ALDs without much troubles but on Monday after playing golf and helping his mother-in-law get in & out of her wheelchair, he started having more sever pain.  States that the pain initially improved with ice/heat/and Ben-Gay, but progressively worsened over the past week.  Patient states that he had difficulty sleeping due to the pain and was up at 3:00 a.m. and severe pain which prompted him to  go to the emergency department.  Patient with significant right inguinal pain that is worse with palpation.  States that pain radiates to his right testicle, right lateral hip, anterior thigh,  and medial calf.  States lifting his right leg up is extremely painful and he is having difficulty adjusting himself in bed due to the pain as well.   Pt has been going to a pain clinic associated with an outside facility [facility]), there he reports that he had been getting steroid injections and recently was referred to another pain manage,ent office for uncontrolled back pain.  and  Patient also reports night sweats and urinary urgency over the past 2-3 weeks.  Denies hematuria or suprapubic pain.  Denies subjective fever or chills, nausea, vomiting, or diarrhea. ROS also negative for chest pain or pressure, shortness of breath, or recent illness.  In ED, CT the abdomen pelvis with IV contrast revealed fatty density along the right femoral vascular structures likely representing a small femoral hernia.  Also has a fat containing left inguinal hernia which is small.   Of note, CT also reveals pancreatic mass that was also identified on CTA of the chest 03/24/2020.  I spoke with the radiologist who compared today's images from images last admission and states there has been no change since February.  Patient was also admitted in February for chest pain. 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 Our images were reviewed with our radiologist in comparison to the report obtained from [facility] from 2017, and no intervention needed at this time.  A fine needle aspiration 2017 revealed benign etiology.  It is recommended [facility] of this area          Hospital Course:   Primary Problem: 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[facility]):  Status post I&D by Orthopedic surgery open and is better cultures were sent.  2/2 his sterile site culture is growing MSSA.  Patient was started on Ancef and ID was consulted, who recommending 6 weeks of treatment last day  06/03/2020.  Case management is worked for [employer] and home antibiotic approved through [place] system patient was feeling well on looking forward to go home.  He started on PT and was wolking with the hallways with physical therapy.  He was looking forward to home and was discharged in a stable condition.  He will follow-up with his PCP in 1 week and with Orthopedic surgery in 1 week to follow up on his wound.  Patient was discharged home with home health for IV antibiotics and wound care    Acute cystitis without hematuria:  Urine culture is growing MSSA.  Likely the source of his bacteremia seeding to his hip.  He was treated with Ancef as above    Essential hypertension:  Continued on home Norvasc    Mixed hyperlipidemia:  Continued on home Lipitor    Acquired hypothyroidism:  Continued on home Synthroid    GERD without esophagitis:  Continued on home PPI    HIV infection (CMS [place]):  Continued on Genvoya         CONDITION ON DISCHARGE:  A. Ambulation: Ambulation with assistive device  B. Self-care Ability: With partial assistance  C. Cognitive Status Oriented x 3  D. DNR status at discharge: Full Code  E. Lace + Score: 67 (04/28/20 0532)  Advance Directive Information    Flowsheet Row Most Recent Value   Does the Patient have an Advance Directive? Yes, Patient Does Have Advance Directive for Healthcare Treatment          DISCHARGE DISPOSITION:    DISCHARGE PATIENT   Ordered at: 04/28/20 1908     Is there a planned readmission to acute care within 30 days?    No     Disposition:    [place], MD    Time spent on discharge greater than 30 minutes? Yes      Copies sent to Care Team       Relationship Specialty Notifications [place] PCP - General EXTERNAL  03/19/18     Phone: [phone] Fax: [phone]         [address] [place] [address]          Referring providers can utilize [email or web address] to access their referred [place]  patient's information.

## 2020-04-30 ENCOUNTER — Encounter (INDEPENDENT_AMBULATORY_CARE_PROVIDER_SITE_OTHER): Payer: Self-pay | Admitting: Orthopaedic Surgery

## 2020-05-01 ENCOUNTER — Ambulatory Visit (INDEPENDENT_AMBULATORY_CARE_PROVIDER_SITE_OTHER): Payer: 59 | Admitting: Orthopaedic Surgery

## 2020-05-01 ENCOUNTER — Other Ambulatory Visit: Payer: Self-pay

## 2020-05-01 ENCOUNTER — Encounter (INDEPENDENT_AMBULATORY_CARE_PROVIDER_SITE_OTHER): Payer: Self-pay | Admitting: Orthopaedic Surgery

## 2020-05-01 VITALS — Ht 70.0 in | Wt 175.0 lb

## 2020-05-01 DIAGNOSIS — Z9889 Other specified postprocedural states: Secondary | ICD-10-CM

## 2020-05-01 DIAGNOSIS — Z09 Encounter for follow-up examination after completed treatment for conditions other than malignant neoplasm: Secondary | ICD-10-CM

## 2020-05-01 NOTE — Care Management Notes (Signed)
Referral Information  ++++++ Placed Provider #1 ++++++  Case Manager: Lori Ruble  Provider Type: DME  Provider Name: [facility]  Address:  [address]  [place], [place] [address]  Contact:    Fax:   Fax:

## 2020-05-01 NOTE — Care Management Notes (Signed)
Referral Information  ++++++ Placed Provider #1 ++++++  Case Manager: Lori Ruble  Provider Type: Home Health  Provider Name: [facility] [place]  Address:  [address]  [place], [place] [address]  Contact: Tracy McClure    Phone: [phone] x  Fax:   Fax: [phone]

## 2020-05-01 NOTE — Care Management Notes (Signed)
Referral Information  ++++++ Placed Provider #1 ++++++  Case Manager: Lori Ruble  Provider Type: Home Infusion  Provider Name: [facility] [place] [place]  Address:  [address], [address]  [place], [place] [id number]  Contact:    Fax:   Fax:

## 2020-05-02 NOTE — Progress Notes (Signed)
[facility]  [address]  [place], [place] [address]    Follow Up    Name: [name]  DOB: May 26, 1945  MRN: [id number]  PCP: [facility]  Referring Provider: No ref. provider found     COPY TO PCP AND REFERRING PROVIDER    Date of Appointment: 05/01/2020     Reason for Visit:   Chief Complaint   Patient presents with   . Post Op     Post op right hip arthrotomy I&D of septic arthritis on 04/22/20. He feels like he is improving. Swelling is decreasing. Concerns with not being able to lift leg like to get into the car. PT starts in home next week.        HPI:  22 old white male 2 weeks status post right hip open arthrotomy irrigation debridement acute septic arthritis.  Patient was routine check.  No complaints he is ambulating relatively well.  Pain is controlled he does still have significant discomfort more than I would expect.  He denies any fevers chills is getting IV antibiotics at home without any complications.  Denies lower extremity neurological symptoms.    Physical Exam:  Vitals:    05/01/20 1020   Weight: 79.4 kg (175 lb)   Height: 1.778 m (5\' 10" )   BMI: 25.16         Body mass index is 25.11 kg/m.  Right hip wound is healed no erythema no warmth no drainage leg was equivalent good hip range of motion no significant irritability sciatic nerve intact    Radiographic/other Studies:     None    Assessment:    No diagnosis found.     Plan:    Staples removed Steri-Strips applied increase activity gradually tolerated follow-up continue IV antibiotic therapy per Infectious Disease Service follow-up with me in 4 weeks radiographs AP the pelvis AP lateral films right hip at the next visit    No orders of the defined types were placed in this encounter.       Follow up:  Return in about 4 weeks (around 05/29/2020).     Glennon Hamilton, MD    This note was partially generated using MModal Fluency Direct system, and there may be some incorrect words, spellings, and  punctuation that were not noted in checking the note before saving.

## 2020-05-04 ENCOUNTER — Ambulatory Visit: Payer: Medicare Other | Attending: INFECTIOUS DISEASE | Admitting: INFECTIOUS DISEASE

## 2020-05-04 ENCOUNTER — Other Ambulatory Visit: Payer: Self-pay

## 2020-05-04 DIAGNOSIS — M00051 Staphylococcal arthritis, right hip: Secondary | ICD-10-CM | POA: Insufficient documentation

## 2020-05-04 DIAGNOSIS — Z792 Long term (current) use of antibiotics: Secondary | ICD-10-CM | POA: Insufficient documentation

## 2020-05-04 LAB — CBC WITH DIFF
BASOPHIL #: <0.1 10*3/uL (ref <=0.20)
BASOPHIL %: 1 %
EOSINOPHIL #: <0.1 10*3/uL (ref <=0.50)
EOSINOPHIL %: 1 %
HCT: 35.1 % — ABNORMAL LOW (ref 38.9–52.0)
HGB: 11.3 g/dL — ABNORMAL LOW (ref 13.4–17.5)
IMMATURE GRANULOCYTE #: 0.16 10*3/uL — ABNORMAL HIGH (ref <0.10)
IMMATURE GRANULOCYTE %: 2 % — ABNORMAL HIGH (ref 0–1)
LYMPHOCYTE #: 1.87 10*3/uL (ref 1.00–4.80)
LYMPHOCYTE %: 19 %
MCH: 30.8 pg (ref 26.0–32.0)
MCHC: 32.2 g/dL (ref 31.0–35.5)
MCV: 95.6 fL (ref 78.0–100.0)
MONOCYTE #: 0.65 10*3/uL (ref 0.20–1.10)
MONOCYTE %: 6 %
MPV: 9.4 fL (ref 8.7–12.5)
NEUTROPHIL #: 7.31 10*3/uL (ref 1.50–7.70)
NEUTROPHIL %: 71 %
PLATELETS: 345 10*3/uL (ref 150–400)
RBC: 3.67 10*6/uL — ABNORMAL LOW (ref 4.50–6.10)
RDW-CV: 14.5 % (ref 11.5–15.5)
WBC: 10.1 10*3/uL (ref 3.7–11.0)

## 2020-05-04 LAB — COMPREHENSIVE METABOLIC PANEL, NON-FASTING
ALBUMIN: 2.6 g/dL — ABNORMAL LOW (ref 3.4–4.8)
ALKALINE PHOSPHATASE: 77 U/L (ref 45–115)
ALT (SGPT): 8 U/L — ABNORMAL LOW (ref 10–55)
ANION GAP: 11 mmol/L (ref 4–13)
AST (SGOT): 18 U/L (ref 8–45)
BILIRUBIN TOTAL: 0.3 mg/dL (ref 0.3–1.3)
BUN/CREA RATIO: 18 (ref 6–22)
BUN: 18 mg/dL (ref 8–25)
CALCIUM: 9 mg/dL (ref 8.8–10.2)
CHLORIDE: 103 mmol/L (ref 96–111)
CO2 TOTAL: 24 mmol/L (ref 23–31)
CREATININE: 1 mg/dL (ref 0.75–1.35)
ESTIMATED GFR: 74 mL/min/BSA (ref >=60)
GLUCOSE: 134 mg/dL — ABNORMAL HIGH (ref 65–125)
POTASSIUM: 4 mmol/L (ref 3.5–5.1)
PROTEIN TOTAL: 6 g/dL (ref 6.0–8.0)
SODIUM: 138 mmol/L (ref 136–145)

## 2020-05-04 LAB — ALK PHOS (ALKALINE PHOSPHATASE): ALKALINE PHOSPHATASE: 77 U/L (ref 45–115)

## 2020-05-04 LAB — C-REACTIVE PROTEIN(CRP),INFLAMMATION: CRP INFLAMMATION: 30.9 mg/L — ABNORMAL HIGH (ref <8.0)

## 2020-05-04 LAB — AST (SGOT): AST (SGOT): 18 U/L (ref 8–45)

## 2020-05-04 LAB — ALT (SGPT): ALT (SGPT): 8 U/L — ABNORMAL LOW (ref 10–55)

## 2020-05-04 LAB — BILIRUBIN TOTAL: BILIRUBIN TOTAL: 0.3 mg/dL (ref 0.3–1.3)

## 2020-05-04 LAB — SEDIMENTATION RATE: ERYTHROCYTE SEDIMENTATION RATE (ESR): 118 mm/hr — ABNORMAL HIGH (ref 0–15)

## 2020-05-08 ENCOUNTER — Encounter (INDEPENDENT_AMBULATORY_CARE_PROVIDER_SITE_OTHER): Payer: Self-pay | Admitting: Orthopaedic Surgery

## 2020-05-08 NOTE — Telephone Encounter (Signed)
-----   Message from [place]. [place] sent at 05/08/2020  9:14 AM EDT -----  Regarding: Question regarding ANAEROBIC CULTURE  Did we identify the type infection that I had?

## 2020-05-11 ENCOUNTER — Other Ambulatory Visit: Payer: Medicare Other | Attending: INFECTIOUS DISEASE | Admitting: INFECTIOUS DISEASE

## 2020-05-11 DIAGNOSIS — B9561 Methicillin susceptible Staphylococcus aureus infection as the cause of diseases classified elsewhere: Secondary | ICD-10-CM | POA: Insufficient documentation

## 2020-05-11 DIAGNOSIS — M00051 Staphylococcal arthritis, right hip: Secondary | ICD-10-CM | POA: Insufficient documentation

## 2020-05-11 LAB — COMPREHENSIVE METABOLIC PANEL, NON-FASTING
ALBUMIN: 3 g/dL — ABNORMAL LOW (ref 3.4–4.8)
ALKALINE PHOSPHATASE: 87 U/L (ref 45–115)
ALT (SGPT): <5 U/L — ABNORMAL LOW (ref 10–55)
ANION GAP: 10 mmol/L (ref 4–13)
AST (SGOT): 15 U/L (ref 8–45)
BILIRUBIN TOTAL: 0.2 mg/dL — ABNORMAL LOW (ref 0.3–1.3)
BUN/CREA RATIO: 15 (ref 6–22)
BUN: 16 mg/dL (ref 8–25)
CALCIUM: 9.2 mg/dL (ref 8.8–10.2)
CHLORIDE: 105 mmol/L (ref 96–111)
CO2 TOTAL: 25 mmol/L (ref 23–31)
CREATININE: 1.04 mg/dL (ref 0.75–1.35)
ESTIMATED GFR: 70 mL/min/BSA (ref >=60)
GLUCOSE: 140 mg/dL — ABNORMAL HIGH (ref 65–125)
POTASSIUM: 4.1 mmol/L (ref 3.5–5.1)
PROTEIN TOTAL: 6.7 g/dL (ref 6.0–8.0)
SODIUM: 140 mmol/L (ref 136–145)

## 2020-05-11 LAB — CBC WITH DIFF
BASOPHIL #: <0.1 10*3/uL (ref <=0.20)
BASOPHIL %: 0 %
EOSINOPHIL #: 0.15 10*3/uL (ref <=0.50)
EOSINOPHIL %: 2 %
HCT: 36.6 % — ABNORMAL LOW (ref 38.9–52.0)
HGB: 11.5 g/dL — ABNORMAL LOW (ref 13.4–17.5)
IMMATURE GRANULOCYTE #: <0.1 10*3/uL (ref <0.10)
IMMATURE GRANULOCYTE %: 1 % (ref 0–1)
LYMPHOCYTE #: 2.34 10*3/uL (ref 1.00–4.80)
LYMPHOCYTE %: 26 %
MCH: 30.2 pg (ref 26.0–32.0)
MCHC: 31.4 g/dL (ref 31.0–35.5)
MCV: 96.1 fL (ref 78.0–100.0)
MONOCYTE #: 0.7 10*3/uL (ref 0.20–1.10)
MONOCYTE %: 8 %
MPV: 9.4 fL (ref 8.7–12.5)
NEUTROPHIL #: 5.77 10*3/uL (ref 1.50–7.70)
NEUTROPHIL %: 63 %
PLATELETS: 304 10*3/uL (ref 150–400)
RBC: 3.81 10*6/uL — ABNORMAL LOW (ref 4.50–6.10)
RDW-CV: 14.9 % (ref 11.5–15.5)
WBC: 9.1 10*3/uL (ref 3.7–11.0)

## 2020-05-11 LAB — ALT (SGPT): ALT (SGPT): <5 U/L — ABNORMAL LOW (ref 10–55)

## 2020-05-11 LAB — BILIRUBIN TOTAL: BILIRUBIN TOTAL: 0.2 mg/dL — ABNORMAL LOW (ref 0.3–1.3)

## 2020-05-11 LAB — ALK PHOS (ALKALINE PHOSPHATASE): ALKALINE PHOSPHATASE: 87 U/L (ref 45–115)

## 2020-05-11 LAB — AST (SGOT): AST (SGOT): 15 U/L (ref 8–45)

## 2020-05-11 LAB — C-REACTIVE PROTEIN(CRP),INFLAMMATION: CRP INFLAMMATION: 21.2 mg/L — ABNORMAL HIGH (ref <8.0)

## 2020-05-11 LAB — SEDIMENTATION RATE: ERYTHROCYTE SEDIMENTATION RATE (ESR): 97 mm/hr — ABNORMAL HIGH (ref 0–15)

## 2020-05-17 LAB — FUNGUS CULTURE: FUNGAL CULTURE: NO GROWTH

## 2020-05-18 ENCOUNTER — Other Ambulatory Visit: Payer: Medicare Other | Attending: INFECTIOUS DISEASE | Admitting: INFECTIOUS DISEASE

## 2020-05-18 DIAGNOSIS — B9561 Methicillin susceptible Staphylococcus aureus infection as the cause of diseases classified elsewhere: Secondary | ICD-10-CM | POA: Insufficient documentation

## 2020-05-18 DIAGNOSIS — M00051 Staphylococcal arthritis, right hip: Secondary | ICD-10-CM | POA: Insufficient documentation

## 2020-05-18 DIAGNOSIS — Z792 Long term (current) use of antibiotics: Secondary | ICD-10-CM | POA: Insufficient documentation

## 2020-05-18 LAB — CBC WITH DIFF
BASOPHIL #: <0.1 10*3/uL (ref <=0.20)
BASOPHIL %: 0 %
EOSINOPHIL #: 0.13 10*3/uL (ref <=0.50)
EOSINOPHIL %: 2 %
HCT: 35.8 % — ABNORMAL LOW (ref 38.9–52.0)
HGB: 11.4 g/dL — ABNORMAL LOW (ref 13.4–17.5)
IMMATURE GRANULOCYTE #: <0.1 10*3/uL (ref <0.10)
IMMATURE GRANULOCYTE %: 1 % (ref 0–1)
LYMPHOCYTE #: 1.75 10*3/uL (ref 1.00–4.80)
LYMPHOCYTE %: 21 %
MCH: 30.2 pg (ref 26.0–32.0)
MCHC: 31.8 g/dL (ref 31.0–35.5)
MCV: 95 fL (ref 78.0–100.0)
MONOCYTE #: 0.6 10*3/uL (ref 0.20–1.10)
MONOCYTE %: 7 %
MPV: 9.6 fL (ref 8.7–12.5)
NEUTROPHIL #: 5.75 10*3/uL (ref 1.50–7.70)
NEUTROPHIL %: 69 %
PLATELETS: 257 10*3/uL (ref 150–400)
RBC: 3.77 10*6/uL — ABNORMAL LOW (ref 4.50–6.10)
RDW-CV: 14.9 % (ref 11.5–15.5)
WBC: 8.3 10*3/uL (ref 3.7–11.0)

## 2020-05-18 LAB — ALT (SGPT): ALT (SGPT): <5 U/L — ABNORMAL LOW (ref 10–55)

## 2020-05-18 LAB — C-REACTIVE PROTEIN(CRP),INFLAMMATION: CRP INFLAMMATION: 39.4 mg/L — ABNORMAL HIGH (ref <8.0)

## 2020-05-18 LAB — COMPREHENSIVE METABOLIC PANEL, NON-FASTING
ALBUMIN: 2.9 g/dL — ABNORMAL LOW (ref 3.4–4.8)
ALKALINE PHOSPHATASE: 87 U/L (ref 45–115)
ALT (SGPT): <5 U/L — ABNORMAL LOW (ref 10–55)
ANION GAP: 10 mmol/L (ref 4–13)
AST (SGOT): 12 U/L (ref 8–45)
BILIRUBIN TOTAL: 0.3 mg/dL (ref 0.3–1.3)
BUN/CREA RATIO: 13 (ref 6–22)
BUN: 12 mg/dL (ref 8–25)
CALCIUM: 9.1 mg/dL (ref 8.8–10.2)
CHLORIDE: 105 mmol/L (ref 96–111)
CO2 TOTAL: 24 mmol/L (ref 23–31)
CREATININE: 0.92 mg/dL (ref 0.75–1.35)
ESTIMATED GFR: 82 mL/min/BSA (ref >=60)
GLUCOSE: 197 mg/dL — ABNORMAL HIGH (ref 65–125)
POTASSIUM: 4.1 mmol/L (ref 3.5–5.1)
PROTEIN TOTAL: 6.2 g/dL (ref 6.0–8.0)
SODIUM: 139 mmol/L (ref 136–145)

## 2020-05-18 LAB — AST (SGOT): AST (SGOT): 12 U/L (ref 8–45)

## 2020-05-18 LAB — ALK PHOS (ALKALINE PHOSPHATASE): ALKALINE PHOSPHATASE: 87 U/L (ref 45–115)

## 2020-05-18 LAB — SEDIMENTATION RATE: ERYTHROCYTE SEDIMENTATION RATE (ESR): 70 mm/hr — ABNORMAL HIGH (ref 0–15)

## 2020-05-18 LAB — BILIRUBIN TOTAL: BILIRUBIN TOTAL: 0.3 mg/dL (ref 0.3–1.3)

## 2020-05-19 LAB — FUNGUS CULTURE: FUNGAL CULTURE: NO GROWTH

## 2020-05-25 ENCOUNTER — Ambulatory Visit: Payer: Medicare Other | Attending: INFECTIOUS DISEASE | Admitting: INFECTIOUS DISEASE

## 2020-05-25 DIAGNOSIS — Z792 Long term (current) use of antibiotics: Secondary | ICD-10-CM | POA: Insufficient documentation

## 2020-05-25 DIAGNOSIS — B9561 Methicillin susceptible Staphylococcus aureus infection as the cause of diseases classified elsewhere: Secondary | ICD-10-CM | POA: Insufficient documentation

## 2020-05-25 DIAGNOSIS — M00051 Staphylococcal arthritis, right hip: Secondary | ICD-10-CM | POA: Insufficient documentation

## 2020-05-25 LAB — COMPREHENSIVE METABOLIC PANEL, NON-FASTING
ALBUMIN: 3.3 g/dL — ABNORMAL LOW (ref 3.4–4.8)
ALKALINE PHOSPHATASE: 97 U/L (ref 45–115)
ALT (SGPT): <5 U/L — ABNORMAL LOW (ref 10–55)
ANION GAP: 11 mmol/L (ref 4–13)
AST (SGOT): 14 U/L (ref 8–45)
BILIRUBIN TOTAL: 0.3 mg/dL (ref 0.3–1.3)
BUN/CREA RATIO: 11 (ref 6–22)
BUN: 11 mg/dL (ref 8–25)
CALCIUM: 9.5 mg/dL (ref 8.8–10.2)
CHLORIDE: 104 mmol/L (ref 96–111)
CO2 TOTAL: 25 mmol/L (ref 23–31)
CREATININE: 1.04 mg/dL (ref 0.75–1.35)
ESTIMATED GFR: 70 mL/min/BSA (ref >=60)
GLUCOSE: 168 mg/dL — ABNORMAL HIGH (ref 65–125)
POTASSIUM: 3.6 mmol/L (ref 3.5–5.1)
PROTEIN TOTAL: 6.4 g/dL (ref 6.0–8.0)
SODIUM: 140 mmol/L (ref 136–145)

## 2020-05-25 LAB — CBC WITH DIFF
BASOPHIL #: <0.1 10*3/uL (ref <=0.20)
BASOPHIL %: 0 %
EOSINOPHIL #: 0.12 10*3/uL (ref <=0.50)
EOSINOPHIL %: 2 %
HCT: 35.9 % — ABNORMAL LOW (ref 38.9–52.0)
HGB: 11.4 g/dL — ABNORMAL LOW (ref 13.4–17.5)
IMMATURE GRANULOCYTE #: <0.1 10*3/uL (ref <0.10)
IMMATURE GRANULOCYTE %: 0 % (ref 0–1)
LYMPHOCYTE #: 1.65 10*3/uL (ref 1.00–4.80)
LYMPHOCYTE %: 24 %
MCH: 30.2 pg (ref 26.0–32.0)
MCHC: 31.8 g/dL (ref 31.0–35.5)
MCV: 95.2 fL (ref 78.0–100.0)
MONOCYTE #: 0.58 10*3/uL (ref 0.20–1.10)
MONOCYTE %: 8 %
MPV: 10 fL (ref 8.7–12.5)
NEUTROPHIL #: 4.57 10*3/uL (ref 1.50–7.70)
NEUTROPHIL %: 66 %
PLATELETS: 243 10*3/uL (ref 150–400)
RBC: 3.77 10*6/uL — ABNORMAL LOW (ref 4.50–6.10)
RDW-CV: 15.2 % (ref 11.5–15.5)
WBC: 7 10*3/uL (ref 3.7–11.0)

## 2020-05-25 LAB — AST (SGOT): AST (SGOT): 14 U/L (ref 8–45)

## 2020-05-25 LAB — ALK PHOS (ALKALINE PHOSPHATASE): ALKALINE PHOSPHATASE: 97 U/L (ref 45–115)

## 2020-05-25 LAB — ALT (SGPT): ALT (SGPT): <5 U/L — ABNORMAL LOW (ref 10–55)

## 2020-05-25 LAB — BILIRUBIN TOTAL: BILIRUBIN TOTAL: 0.3 mg/dL (ref 0.3–1.3)

## 2020-05-25 LAB — SEDIMENTATION RATE: ERYTHROCYTE SEDIMENTATION RATE (ESR): 59 mm/hr — ABNORMAL HIGH (ref 0–15)

## 2020-05-25 LAB — C-REACTIVE PROTEIN(CRP),INFLAMMATION: CRP INFLAMMATION: 8.9 mg/L — ABNORMAL HIGH (ref <8.0)

## 2020-05-30 ENCOUNTER — Other Ambulatory Visit: Payer: Self-pay

## 2020-06-01 ENCOUNTER — Ambulatory Visit: Payer: Medicare Other | Attending: INFECTIOUS DISEASE | Admitting: INFECTIOUS DISEASE

## 2020-06-01 DIAGNOSIS — B9561 Methicillin susceptible Staphylococcus aureus infection as the cause of diseases classified elsewhere: Secondary | ICD-10-CM

## 2020-06-01 DIAGNOSIS — M00051 Staphylococcal arthritis, right hip: Secondary | ICD-10-CM | POA: Insufficient documentation

## 2020-06-01 LAB — CBC WITH DIFF
BASOPHIL #: <0.1 10*3/uL (ref <=0.20)
BASOPHIL %: 0 %
EOSINOPHIL #: 0.16 10*3/uL (ref <=0.50)
EOSINOPHIL %: 2 %
HCT: 39.8 % (ref 38.9–52.0)
HGB: 12.5 g/dL — ABNORMAL LOW (ref 13.4–17.5)
IMMATURE GRANULOCYTE #: <0.1 10*3/uL (ref <0.10)
IMMATURE GRANULOCYTE %: 1 % (ref 0–1)
LYMPHOCYTE #: 2.31 10*3/uL (ref 1.00–4.80)
LYMPHOCYTE %: 30 %
MCH: 30.3 pg (ref 26.0–32.0)
MCHC: 31.4 g/dL (ref 31.0–35.5)
MCV: 96.6 fL (ref 78.0–100.0)
MONOCYTE #: 0.63 10*3/uL (ref 0.20–1.10)
MONOCYTE %: 8 %
MPV: 10.3 fL (ref 8.7–12.5)
NEUTROPHIL #: 4.46 10*3/uL (ref 1.50–7.70)
NEUTROPHIL %: 59 %
PLATELETS: 241 10*3/uL (ref 150–400)
RBC: 4.12 10*6/uL — ABNORMAL LOW (ref 4.50–6.10)
RDW-CV: 15.4 % (ref 11.5–15.5)
WBC: 7.6 10*3/uL (ref 3.7–11.0)

## 2020-06-01 LAB — COMPREHENSIVE METABOLIC PANEL, NON-FASTING
ALBUMIN: 3.7 g/dL (ref 3.4–4.8)
ALKALINE PHOSPHATASE: 114 U/L (ref 45–115)
ALT (SGPT): <5 U/L — ABNORMAL LOW (ref 10–55)
ANION GAP: 13 mmol/L (ref 4–13)
AST (SGOT): 13 U/L (ref 8–45)
BILIRUBIN TOTAL: 0.3 mg/dL (ref 0.3–1.3)
BUN/CREA RATIO: 12 (ref 6–22)
BUN: 12 mg/dL (ref 8–25)
CALCIUM: 9.4 mg/dL (ref 8.8–10.2)
CHLORIDE: 107 mmol/L (ref 96–111)
CO2 TOTAL: 22 mmol/L — ABNORMAL LOW (ref 23–31)
CREATININE: 1.01 mg/dL (ref 0.75–1.35)
ESTIMATED GFR: 73 mL/min/BSA (ref >=60)
GLUCOSE: 138 mg/dL — ABNORMAL HIGH (ref 65–125)
POTASSIUM: 4.1 mmol/L (ref 3.5–5.1)
PROTEIN TOTAL: 6.8 g/dL (ref 6.0–8.0)
SODIUM: 142 mmol/L (ref 136–145)

## 2020-06-01 LAB — BILIRUBIN TOTAL: BILIRUBIN TOTAL: 0.3 mg/dL (ref 0.3–1.3)

## 2020-06-01 LAB — C-REACTIVE PROTEIN(CRP),INFLAMMATION: CRP INFLAMMATION: 3.1 mg/L (ref <8.0)

## 2020-06-01 LAB — ALK PHOS (ALKALINE PHOSPHATASE): ALKALINE PHOSPHATASE: 114 U/L (ref 45–115)

## 2020-06-01 LAB — AST (SGOT): AST (SGOT): 13 U/L (ref 8–45)

## 2020-06-01 LAB — SEDIMENTATION RATE: ERYTHROCYTE SEDIMENTATION RATE (ESR): 27 mm/hr — ABNORMAL HIGH (ref 0–15)

## 2020-06-01 LAB — ALT (SGPT): ALT (SGPT): <5 U/L — ABNORMAL LOW (ref 10–55)

## 2020-06-02 ENCOUNTER — Other Ambulatory Visit: Payer: Self-pay

## 2020-06-02 ENCOUNTER — Ambulatory Visit
Admission: RE | Admit: 2020-06-02 | Discharge: 2020-06-02 | Disposition: A | Discharge status: Home / Self Care | Payer: Medicare Other | Source: Ambulatory Visit | Attending: Orthopaedic Surgery | Admitting: Orthopaedic Surgery

## 2020-06-02 ENCOUNTER — Encounter (INDEPENDENT_AMBULATORY_CARE_PROVIDER_SITE_OTHER): Payer: Self-pay | Admitting: Orthopaedic Surgery

## 2020-06-02 ENCOUNTER — Ambulatory Visit (INDEPENDENT_AMBULATORY_CARE_PROVIDER_SITE_OTHER): Payer: 59 | Admitting: Orthopaedic Surgery

## 2020-06-02 VITALS — BP 118/80 | Ht 70.0 in | Wt 175.0 lb

## 2020-06-02 DIAGNOSIS — Z9889 Other specified postprocedural states: Secondary | ICD-10-CM

## 2020-06-02 DIAGNOSIS — Z09 Encounter for follow-up examination after completed treatment for conditions other than malignant neoplasm: Secondary | ICD-10-CM

## 2020-06-02 NOTE — Progress Notes (Signed)
[facility]  [address]  [place], [place] [address]    Follow Up    Name: [name]  DOB: 1945/08/14  MRN: [id number]  PCP: [facility]  Referring Provider: No ref. provider found     COPY TO PCP AND REFERRING PROVIDER    Date of Appointment: 06/02/2020     Reason for Visit:   Chief Complaint   Patient presents with   . Post Op     Post op right hip arthrotomy I&D of septic arthritis on 04/22/20.            HPI:  62 old white male 6 weeks status post right hip open arthrotomy irrigation debridement of septic arthritis.  Patient has received now 6 weeks of IV antibiotics.  Presents routine check he has no complaints all preoperative hip pain is resolved he has no systemic symptoms.  No fevers chills weight loss or appetite change.  No lower extremity neurological symptoms.  He is back to playing golf he played 18 holes each the past 2 days without any discomfort his hip he has a history of severe right knee pain due to knee osteoarthritis and trying to get a [facility] [employer] referral for me to evaluate his right knee.    Physical Exam:  Vitals:    06/02/20 0954   BP: 118/80   Weight: 79.4 kg (175 lb)   Height: 1.778 m (5\' 10" )   BMI: 25.16         Body mass index is 25.11 kg/m.  Clinical exam thin healthy-appearing elderly white male pleasant cooperative slightly antalgic gait due to his knee pain.  He does not have a Trendelenburg gait right hip wound is healed no erythema warmth no swelling no drainage leg lengths are equivalent no pain with flexion internal rotation of the hip hip is completely not irritable    Radiographic/other Studies:     AP lateral radiographs of the right hip demonstrate no osteolytic lesions.  There is mild joint space narrowing to moderate joint space narrowing present.  No osteophytes.    Assessment:    (Z09) Postop check  (primary encounter diagnosis)  Plan: XR HIP RIGHT W PELVIS 2-3 VIEWS, XR HIP RIGHT W        PELVIS  2-3 VIEWS (AMB ONLY)       Plan:    Activity as tolerated follow-up the right hip no further treatment from knee needed follow up me on an as-needed basis regarding the hip.  He has right knee problems and will be seeing the hopefully soon as he gets his [employer] referral    Orders Placed This Encounter   . XR HIP RIGHT W PELVIS 2-3 VIEWS (AMB ONLY)   . XR HIP RIGHT W PELVIS 2-3 VIEWS        Follow up:  Return if symptoms worsen or fail to improve.     Glennon Hamilton, MD    This note was partially generated using MModal Fluency Direct system, and there may be some incorrect words, spellings, and punctuation that were not noted in checking the note before saving.

## 2020-06-04 LAB — AFB CULTURE WITH STAIN
AFB CULTURE: NO GROWTH
AFB SMEAR: NONE SEEN

## 2020-07-31 ENCOUNTER — Other Ambulatory Visit: Payer: Self-pay

## 2020-07-31 ENCOUNTER — Ambulatory Visit
Admission: RE | Admit: 2020-07-31 | Discharge: 2020-07-31 | Disposition: A | Discharge status: Home / Self Care | Payer: 59 | Source: Ambulatory Visit | Attending: Orthopaedic Surgery | Admitting: Orthopaedic Surgery

## 2020-07-31 ENCOUNTER — Encounter (INDEPENDENT_AMBULATORY_CARE_PROVIDER_SITE_OTHER): Payer: Self-pay | Admitting: Orthopaedic Surgery

## 2020-07-31 ENCOUNTER — Ambulatory Visit (INDEPENDENT_AMBULATORY_CARE_PROVIDER_SITE_OTHER): Payer: 59 | Admitting: Orthopaedic Surgery

## 2020-07-31 VITALS — BP 128/64 | Ht 70.0 in | Wt 175.0 lb

## 2020-07-31 DIAGNOSIS — M25561 Pain in right knee: Secondary | ICD-10-CM | POA: Insufficient documentation

## 2020-07-31 DIAGNOSIS — G8929 Other chronic pain: Secondary | ICD-10-CM

## 2020-08-02 NOTE — H&P (Signed)
[facility]  [address]  [place], [place] [address]           Name: [name]  DOB: [birth year]  MRN: [id number]  PCP: [facility]  Referring Provider: Glennon Hamilton, MD     COPY TO PCP AND REFERRING PROVIDER    Date of Appointment: 07/31/2020    Chief Complaint:   Chief Complaint   Patient presents with   . Knee Pain     Right knee pain, no known inj         History of Present Illness:  75 year old white male presents today complaining of chronic pain in his right knee pain for at least a year progressive in nature.  Pain is rated as moderate.  He has not had any treatment other than some over-the-counter medications.  Most of his pain is located in the medial side of the knee.  It is intermittent swelling.  He has weight-bearing pain only.  He does not complain of rest pain at this point.  He denies any mechanical symptoms of catching or locking.  He denies hip or back pain he has no referred no radicular pain.  Denies lower extremity numbness or paresthesias as well.  His symptoms are moderately activity limiting.  For him.  He does not walk with any assistive devices.    Past Medical History:   Diagnosis Date   . Esophageal reflux    . High cholesterol    . HTN (hypertension)    . Hypothyroidism            Past Surgical History:   Procedure Laterality Date   . COLON SURGERY     . 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 Parke Simmers   . HX HERNIA REPAIR     . WRIST SURGERY Right            Current Outpatient Medications   Medication Sig   . amlodipine besylate (AMLODIPINE ORAL) Take 5 mg by mouth Once a day   . atorvastatin (LIPITOR) 80 mg Oral Tablet Take 80 mg by mouth Every evening Take one half pill every morning with breakfast   . cholecalciferol, vitamin D3, 25 mcg (1,000 unit) Oral Tablet Take 1,000 Units by mouth Once a day   . docusate sodium (COLACE) 100 mg Oral Capsule Take 100 mg by mouth Twice daily    . elviteg-cob-emtri-tenof ALAFEN (GENVOYA) 150-150-200-10 mg Oral Tablet Every one hour   . gabapentin (NEURONTIN) 300 mg Oral Capsule Take 1 Tab by mouth Three times a day   . HYDROcodone-acetaminophen (NORCO) 5-325 mg Oral Tablet Take 1 Tablet by mouth Every 4 hours as needed   . levothyroxine (SYNTHROID) 88 mcg Oral Tablet Take 88 mcg by mouth Every morning   . melatonin 10 mg Oral Capsule Take by mouth   . omega-3-DHA-EPA-fish oil 1,000 mg (120 mg-180 mg) Oral Capsule Take by mouth Once a day   . omeprazole (PRILOSEC) 20 mg Oral Capsule, Delayed Release(E.C.) Take 20 mg by mouth Twice daily       No Known Allergies    Family Medical History:     Problem Relation (Age of Onset)    Cancer Mother, Father, Other    Heart Attack Mother    High Cholesterol Other    Stroke Other            Social History     Tobacco Use   .  Smoking status: Never Smoker   . Smokeless tobacco: Never Used   Substance Use Topics   . Alcohol use: Not Currently          No flowsheet data found.      Physical Exam:  BP 128/64   Ht 1.778 m (5\' 10" )   Wt 79.4 kg (175 lb)   BMI 25.11 kg/m       Body mass index is 25.11 kg/m.   General:  No acute distress.  Alert and oriented.  Skin:  Warm, pink, no lesions, no edema.  Head:  Atraumatic, normocephalic.  Eyes:  Pupils equal.  Ocular motor intact.  No scleral icterus or conjunctiva.  ENT:  No tracheal deviation.  Oral mucosa moist/pink.  Normal nose/ears.  Chest:  Symmetrical excursion.  No dyspnea.  No audible wheezes.  Cardiovascular:  Regular palpable distal pulses.  Brisk papillary refill.  Abdomen:  Soft.  Non-distended.  Neuro:  UE and LE sensation and motor grossly intact.  No tremor    Exam demonstrates an elderly white male pleasant cooperative for no distress back is nontender supple straight negative hips are nontender bilaterally exam of the right knee demonstrates point tenderness on the medial joint line he has a varus deformity which is correctable with valgus stress with knee  flexion his knee range of motion is 5-125 degrees.  No instability varus valgus stress testing at 12:30 a.m. of flexion.  Negative Hoffa negative plica negative Lachman negative anterior posterior drawer.  Neurovascular exam grossly is normal.  No previous surgical incisions are visible.    Radiographic/Other Studies:     AP lateral standing rest radiographs of the knee sunrise view of the knee demonstrates moderate medial joint space narrowing.  The patellofemoral joint lateral compartment well spared anterior cruciate ligament is intact based on lateral radiograph.    Assessment:  (M25.561) Right knee pain  (primary encounter diagnosis)  Plan: XR KNEE STANDING RIGHT, XR KNEE STANDING RIGHT         (AMB ONLY)    (M25.561,  G89.29) Chronic pain of right knee      Plan:    Moderate right knee osteoarthritis.  The right knee was injected sterilely lidocaine steroid prep follow-up in 4 weeks    Orders Placed This Encounter   . XR KNEE STANDING RIGHT (AMB ONLY)   . XR KNEE STANDING RIGHT        Follow up:  Return in about 4 weeks (around 08/28/2020).       Glennon Hamilton, MD  08/02/2020, 10:17    This note was partially generated using MModal Fluency Direct system, and there may be some incorrect words, spellings, and punctuation that were not noted in checking the note before saving.

## 2020-11-02 NOTE — Cancer Center Note (Signed)
[facility]  [address]   [place], [place] [address]   [phone]          Name:[name]  Date of Birth: 1945/03/06  MRN: [id number]  Date of Service:  11/03/2020  Referring Provider: Micheline Chapman, MD     Chief Complaint: New Patient (Acute embolism /)     HPI:  [name] is a 75 y.o. who presents to the [facility] at [facility] for evaluation and opinion regarding the venous thrombosis of right lower extremity.     He has significant past medical history of prostate cancer which has been treated with radiation therapy in 2017 and since then his prostate cancer is controlled as per patient with less than 0.1 of PSA.  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  He has history of intestinal perforation because of diverticulosis.  He was admitted in [place] the hospital in March, with Staphylococcus arthritis of the right hip, had required drainage.  At that time he had duplex study of the right lower extremity and it was negative for any thrombosis.  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  Has been started on apixaban.  Right now he is on apixaban 2.5 mg BD.    His main concern is dizziness, or on for last 2 years, he had spells of dizziness which comes and goes.  With each spell he feel like drained out.  He had cardiac monitoring, result which is not available yet.  Beside that he has seen the ENT doctor and there is decrease in his left ear hearing.       04/19/20    HISTORY: M79.606: Leg pain    Spectral analysis of, grayscale, and color flow imaging of the venous system of the right lower extremity is performed. Normal flow is seen within the common femoral, superficial femoral, and popliteal veins. These show good  augmentation and compressibility. There is no evidence for deep venous thrombosis. The upper calf veins, if visualized, appear normal.    IMPRESSION:  1. There is no evidence for deep venous thrombosis within the right lower extremity.     Oncology History  Oncology History    No history exists.       Past Medical History  Past Medical History:   Diagnosis Date   . Esophageal reflux    . High cholesterol    . HTN (hypertension)    . Hypothyroidism          Past Surgical History:   Procedure Laterality Date   . COLON SURGERY     . 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 Parke Simmers   . HX HERNIA REPAIR     . WRIST SURGERY Right          Current Outpatient Medications   Medication Sig Dispense Refill   . amlodipine besylate (AMLODIPINE ORAL) Take 5 mg by mouth Once a day     . atorvastatin (LIPITOR) 80 mg Oral Tablet Take 80 mg by mouth Every evening Take one half pill every morning with breakfast     . cholecalciferol, vitamin D3, 25 mcg (1,000 unit) Oral Tablet Take  1,000 Units by mouth Once a day     . docusate sodium (COLACE) 100 mg Oral Capsule Take 100 mg by mouth Twice daily     . elviteg-cob-emtri-tenof ALAFEN (GENVOYA) 150-150-200-10 mg Oral Tablet Every one hour     . gabapentin (NEURONTIN) 300 mg Oral Capsule Take 1 Tab by mouth Three times a day     . HYDROcodone-acetaminophen (NORCO) 5-325 mg Oral Tablet Take 1 Tablet by mouth Every 4 hours as needed 15 Tablet 0   . levothyroxine (SYNTHROID) 88 mcg Oral Tablet Take 88 mcg by mouth Every morning     . melatonin 10 mg Oral Capsule Take by mouth     . omega-3-DHA-EPA-fish oil 1,000 mg (120 mg-180 mg) Oral Capsule Take by mouth Once a day     . omeprazole (PRILOSEC) 20 mg Oral Capsule, Delayed Release(E.C.) Take 20 mg by mouth Twice daily     . tamsulosin (FLOMAX) 0.4 mg Oral Capsule Take 0.4 mg by mouth       No current facility-administered medications for this visit.     No Known  Allergies  Social History     Tobacco Use   . Smoking status: Never Smoker   . Smokeless tobacco: Never Used   Substance Use Topics   . Alcohol use: Not Currently      Family Medical History:     Problem Relation (Age of Onset)    Cancer Mother, Father, Other    Heart Attack Mother    High Cholesterol Other    Stroke Other          Review of Systems  As above  Physical Examination:   Most Recent [place] Office Visit from 07/31/2020 in Orthopaedics, [facility]   Temperature --   Heart Rate --   Respiratory Rate --   BP (Non-Invasive) 128/64 filed at... 07/31/2020 0818   SpO2 --   Height 1.778 m (5\' 10" ) filed at... 07/31/2020 0818   Weight 79.4 kg (175 lb) filed at... 07/31/2020 0818   BMI (Calculated) 25.16 filed at... 07/31/2020 0818   BSA (Calculated) 1.98 filed at... 07/31/2020 0818      Performance Status:  0  General appearance: well appearing, alert, in no acute distress.  Skin: skin color, texture, tugor normal, no rashes or lesions  Head: nomocephalic, no masses, lesions, tendernesss or abdomalities  Eyes: Anicteric sclera.  Extraocular movements are intact  Ears: external ears normal  Oropharynx: Oropharynx clear, no erthema, no thrush.  Neck: Supple,thyroid not palpable  Lymphatic: no palpable lymphadenopathy  CVS: S1 and S2, no audible murmur/ rub  Chest: Clear to auscultation bilaterally  Abdomen: Soft, non tender, non distended, no hepatosplenomegaly. Bowel sounds audible, midline scar.   Gynecology and Rectal exam: Deferred  TDV:VOHYW, oriented and grossly intact    LABS  No results found for this or any previous visit (from the past 84 hour(s)).  Outside lab CBC is within normal limits creatinine is 1.22 LFTs are within normal limit LDH is slightly elevated.   Radiology  No results were found from the past 30 days.    Last Mammogram (if applicable): No results found for this or any previous visit (from the past 17520 hour(s)).    Last PetScan (if applicable): No results  found for this or any previous visit (from the past 17520 hour(s)).    Recent Pathology          Assessment and Plan  ICD-10-CM    1. Acute DVT of right tibial vein (CMS HCC)  I82.441 CT ANGIO CHEST W IV CONTRAST     Refer to [facility]) Neurology, [facility]   2. 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  This DVT may be unprovoked or it may be related to his previous hip surgery.    As far as his prostate cancer, it has been treated with radiation therapy in 2017, in March of this year was 0.27 will repeat one in the next visit.    He has chronic dizziness etiology not clear, may be related to middle ear, he is already following the ENT doctor, also had cardiac monitor and following with the primary care physician.  I will refer him to Neurology.       Milinda Cave, MD    This note was partially generated using MModal Fluency Direct system, and there may be some incorrect words, spellings, and punctuation that were not noted in checking the note before saving.

## 2020-11-03 ENCOUNTER — Other Ambulatory Visit: Payer: Self-pay

## 2020-11-03 ENCOUNTER — Encounter (INDEPENDENT_AMBULATORY_CARE_PROVIDER_SITE_OTHER): Payer: Self-pay | Admitting: HEMATOLOGY/ONCOLOGY

## 2020-11-03 ENCOUNTER — Ambulatory Visit (HOSPITAL_BASED_OUTPATIENT_CLINIC_OR_DEPARTMENT_OTHER)
Admission: RE | Admit: 2020-11-03 | Discharge: 2020-11-03 | Disposition: A | Discharge status: Home / Self Care | Payer: 59 | Source: Ambulatory Visit

## 2020-11-03 ENCOUNTER — Other Ambulatory Visit (HOSPITAL_BASED_OUTPATIENT_CLINIC_OR_DEPARTMENT_OTHER): Payer: Self-pay | Admitting: HEMATOLOGY/ONCOLOGY

## 2020-11-03 ENCOUNTER — Ambulatory Visit: Payer: 59 | Attending: HEMATOLOGY/ONCOLOGY | Admitting: HEMATOLOGY/ONCOLOGY

## 2020-11-03 VITALS — BP 126/78 | HR 84 | Temp 97.5°F | Ht 70.0 in | Wt 179.0 lb

## 2020-11-03 DIAGNOSIS — E079 Disorder of thyroid, unspecified: Secondary | ICD-10-CM | POA: Insufficient documentation

## 2020-11-03 DIAGNOSIS — E785 Hyperlipidemia, unspecified: Secondary | ICD-10-CM | POA: Insufficient documentation

## 2020-11-03 DIAGNOSIS — I82441 Acute embolism and thrombosis of right tibial vein: Secondary | ICD-10-CM

## 2020-11-03 DIAGNOSIS — Z21 Asymptomatic human immunodeficiency virus [HIV] infection status: Secondary | ICD-10-CM | POA: Insufficient documentation

## 2020-11-03 DIAGNOSIS — B0229 Other postherpetic nervous system involvement: Secondary | ICD-10-CM | POA: Insufficient documentation

## 2020-11-03 DIAGNOSIS — Z8546 Personal history of malignant neoplasm of prostate: Secondary | ICD-10-CM | POA: Insufficient documentation

## 2020-11-03 DIAGNOSIS — L219 Seborrheic dermatitis, unspecified: Secondary | ICD-10-CM | POA: Insufficient documentation

## 2020-11-03 DIAGNOSIS — W19XXXA Unspecified fall, initial encounter: Secondary | ICD-10-CM | POA: Insufficient documentation

## 2020-11-03 DIAGNOSIS — K432 Incisional hernia without obstruction or gangrene: Secondary | ICD-10-CM | POA: Insufficient documentation

## 2020-11-03 DIAGNOSIS — C61 Malignant neoplasm of prostate: Secondary | ICD-10-CM | POA: Insufficient documentation

## 2020-11-03 DIAGNOSIS — S8390XA Sprain of unspecified site of unspecified knee, initial encounter: Secondary | ICD-10-CM | POA: Insufficient documentation

## 2020-11-03 DIAGNOSIS — Z809 Family history of malignant neoplasm, unspecified: Secondary | ICD-10-CM | POA: Insufficient documentation

## 2020-11-03 DIAGNOSIS — K5732 Diverticulitis of large intestine without perforation or abscess without bleeding: Secondary | ICD-10-CM | POA: Insufficient documentation

## 2020-11-03 DIAGNOSIS — R42 Dizziness and giddiness: Secondary | ICD-10-CM

## 2020-11-03 DIAGNOSIS — B3749 Other urogenital candidiasis: Secondary | ICD-10-CM | POA: Insufficient documentation

## 2020-11-03 DIAGNOSIS — L570 Actinic keratosis: Secondary | ICD-10-CM | POA: Insufficient documentation

## 2020-11-03 DIAGNOSIS — F330 Major depressive disorder, recurrent, mild: Secondary | ICD-10-CM | POA: Insufficient documentation

## 2020-11-03 DIAGNOSIS — N189 Chronic kidney disease, unspecified: Secondary | ICD-10-CM | POA: Insufficient documentation

## 2020-11-03 LAB — CBC WITH DIFF
BASOPHIL #: <0.1 10*3/uL (ref <=0.20)
BASOPHIL %: 0 %
EOSINOPHIL #: <0.1 10*3/uL (ref <=0.50)
EOSINOPHIL %: 1 %
HCT: 37.3 % — ABNORMAL LOW (ref 38.9–52.0)
HGB: 12.1 g/dL — ABNORMAL LOW (ref 13.4–17.5)
IMMATURE GRANULOCYTE #: <0.1 10*3/uL (ref <0.10)
IMMATURE GRANULOCYTE %: 1 % (ref 0–1)
LYMPHOCYTE #: 2.79 10*3/uL (ref 1.00–4.80)
LYMPHOCYTE %: 27 %
MCH: 30.2 pg (ref 26.0–32.0)
MCHC: 32.4 g/dL (ref 31.0–35.5)
MCV: 93 fL (ref 78.0–100.0)
MONOCYTE #: 0.71 10*3/uL (ref 0.20–1.10)
MONOCYTE %: 7 %
MPV: 9.1 fL (ref 8.7–12.5)
NEUTROPHIL #: 6.63 10*3/uL (ref 1.50–7.70)
NEUTROPHIL %: 64 %
PLATELETS: 217 10*3/uL (ref 150–400)
RBC: 4.01 10*6/uL — ABNORMAL LOW (ref 4.50–6.10)
RDW-CV: 17.4 % — ABNORMAL HIGH (ref 11.5–15.5)
WBC: 10.3 10*3/uL (ref 3.7–11.0)

## 2020-11-03 LAB — COMPREHENSIVE METABOLIC PANEL, NON-FASTING
ALBUMIN: 3.7 g/dL (ref 3.4–4.8)
ALKALINE PHOSPHATASE: 92 U/L (ref 45–115)
ALT (SGPT): 20 U/L (ref 10–55)
ANION GAP: 10 mmol/L (ref 4–13)
AST (SGOT): 17 U/L (ref 8–45)
BILIRUBIN TOTAL: 0.7 mg/dL (ref 0.3–1.3)
BUN/CREA RATIO: 10 (ref 6–22)
BUN: 18 mg/dL (ref 8–25)
CALCIUM: 9.3 mg/dL (ref 8.8–10.2)
CHLORIDE: 106 mmol/L (ref 96–111)
CO2 TOTAL: 23 mmol/L (ref 23–31)
CREATININE: 1.77 mg/dL — ABNORMAL HIGH (ref 0.75–1.35)
ESTIMATED GFR: 40 mL/min/BSA — ABNORMAL LOW (ref >=60)
GLUCOSE: 132 mg/dL — ABNORMAL HIGH (ref 65–125)
POTASSIUM: 3.6 mmol/L (ref 3.5–5.1)
PROTEIN TOTAL: 6.2 g/dL (ref 6.0–8.0)
SODIUM: 139 mmol/L (ref 136–145)

## 2020-11-03 LAB — PSA, DIAGNOSTIC: PSA: 0.2 ng/mL (ref <=4.00)

## 2020-11-03 MED ORDER — APIXABAN 5 MG TABLET
5.0000 mg | ORAL_TABLET | Freq: Two times a day (BID) | ORAL | 2 refills | Status: DC
Start: 2020-11-03 — End: 2020-11-05

## 2020-11-03 NOTE — Addendum Note (Signed)
Addended byDyke Maes on: 11/03/2020 01:20 PM     Modules accepted: Orders

## 2020-11-05 ENCOUNTER — Other Ambulatory Visit (HOSPITAL_BASED_OUTPATIENT_CLINIC_OR_DEPARTMENT_OTHER): Payer: Self-pay | Admitting: HEMATOLOGY/ONCOLOGY

## 2020-11-05 DIAGNOSIS — I82441 Acute embolism and thrombosis of right tibial vein: Secondary | ICD-10-CM

## 2020-11-05 NOTE — Nursing Note (Signed)
Phoned pt to let him know to continue Eliquis at the 2.5mg  dose BID, as it in combination with his home medication of Genvoya can increase serum concentration of the Eliquis. Pt does not need a refill at this time. Pt has 3 months supply on hand. Dyke Maes, RN  11/05/2020, 17:21

## 2020-11-06 ENCOUNTER — Inpatient Hospital Stay (INDEPENDENT_AMBULATORY_CARE_PROVIDER_SITE_OTHER)
Admission: RE | Admit: 2020-11-06 | Discharge: 2020-11-06 | Disposition: A | Discharge status: Home / Self Care | Payer: Medicare Other | Source: Ambulatory Visit | Attending: INTERNAL MEDICINE | Admitting: INTERNAL MEDICINE

## 2020-11-06 ENCOUNTER — Other Ambulatory Visit: Payer: Self-pay

## 2020-11-06 DIAGNOSIS — I82441 Acute embolism and thrombosis of right tibial vein: Secondary | ICD-10-CM

## 2020-11-06 DIAGNOSIS — R42 Dizziness and giddiness: Secondary | ICD-10-CM

## 2020-11-09 ENCOUNTER — Other Ambulatory Visit: Payer: Self-pay

## 2020-11-09 ENCOUNTER — Inpatient Hospital Stay
Admission: RE | Admit: 2020-11-09 | Discharge: 2020-11-09 | Disposition: A | Discharge status: Home / Self Care | Payer: Medicare Other | Source: Ambulatory Visit | Attending: HEMATOLOGY/ONCOLOGY | Admitting: HEMATOLOGY/ONCOLOGY

## 2020-11-09 ENCOUNTER — Telehealth (HOSPITAL_BASED_OUTPATIENT_CLINIC_OR_DEPARTMENT_OTHER): Payer: Self-pay | Admitting: HEMATOLOGY/ONCOLOGY

## 2020-11-09 DIAGNOSIS — N289 Disorder of kidney and ureter, unspecified: Secondary | ICD-10-CM

## 2020-11-09 LAB — COMPREHENSIVE METABOLIC PANEL, NON-FASTING
ALBUMIN: 3.9 g/dL (ref 3.4–4.8)
ALKALINE PHOSPHATASE: 92 U/L (ref 45–115)
ALT (SGPT): 18 U/L (ref 10–55)
ANION GAP: 6 mmol/L (ref 4–13)
AST (SGOT): 16 U/L (ref 8–45)
BILIRUBIN TOTAL: 0.6 mg/dL (ref 0.3–1.3)
BUN/CREA RATIO: 10 (ref 6–22)
BUN: 14 mg/dL (ref 8–25)
CALCIUM: 9.6 mg/dL (ref 8.8–10.2)
CHLORIDE: 108 mmol/L (ref 96–111)
CO2 TOTAL: 25 mmol/L (ref 23–31)
CREATININE: 1.34 mg/dL (ref 0.75–1.35)
ESTIMATED GFR: 55 mL/min/BSA — ABNORMAL LOW (ref >=60)
GLUCOSE: 89 mg/dL (ref 65–125)
POTASSIUM: 3.7 mmol/L (ref 3.5–5.1)
PROTEIN TOTAL: 6.2 g/dL (ref 6.0–8.0)
SODIUM: 139 mmol/L (ref 136–145)

## 2020-11-09 NOTE — Telephone Encounter (Signed)
Notified patient of decreased kidney function on recent lab. CTA of chest was canceled due to this reading. Pt planning to come into office 11/10/20 to repeat CMP.  Dyke Maes, RN  11/09/2020, 10:23

## 2020-11-19 ENCOUNTER — Ambulatory Visit (HOSPITAL_BASED_OUTPATIENT_CLINIC_OR_DEPARTMENT_OTHER): Payer: Self-pay | Admitting: Radiology

## 2020-11-20 ENCOUNTER — Ambulatory Visit (INDEPENDENT_AMBULATORY_CARE_PROVIDER_SITE_OTHER): Payer: Self-pay

## 2020-11-20 ENCOUNTER — Ambulatory Visit (HOSPITAL_BASED_OUTPATIENT_CLINIC_OR_DEPARTMENT_OTHER): Payer: Self-pay | Admitting: HEMATOLOGY/ONCOLOGY

## 2020-11-20 ENCOUNTER — Other Ambulatory Visit: Payer: Self-pay

## 2020-11-20 ENCOUNTER — Inpatient Hospital Stay
Admission: RE | Admit: 2020-11-20 | Discharge: 2020-11-20 | Disposition: A | Discharge status: Home / Self Care | Payer: Medicare Other | Source: Ambulatory Visit | Attending: HEMATOLOGY/ONCOLOGY | Admitting: HEMATOLOGY/ONCOLOGY

## 2020-11-20 DIAGNOSIS — N289 Disorder of kidney and ureter, unspecified: Secondary | ICD-10-CM | POA: Insufficient documentation

## 2020-11-20 LAB — COMPREHENSIVE METABOLIC PANEL, NON-FASTING
ALBUMIN: 3.9 g/dL (ref 3.4–4.8)
ALKALINE PHOSPHATASE: 105 U/L (ref 45–115)
ALT (SGPT): 19 U/L (ref 10–55)
ANION GAP: 9 mmol/L (ref 4–13)
AST (SGOT): 15 U/L (ref 8–45)
BILIRUBIN TOTAL: 0.6 mg/dL (ref 0.3–1.3)
BUN/CREA RATIO: 10 (ref 6–22)
BUN: 14 mg/dL (ref 8–25)
CALCIUM: 9.6 mg/dL (ref 8.8–10.2)
CHLORIDE: 106 mmol/L (ref 96–111)
CO2 TOTAL: 24 mmol/L (ref 23–31)
CREATININE: 1.43 mg/dL — ABNORMAL HIGH (ref 0.75–1.35)
ESTIMATED GFR: 51 mL/min/BSA — ABNORMAL LOW (ref >=60)
GLUCOSE: 136 mg/dL — ABNORMAL HIGH (ref 65–125)
POTASSIUM: 3.6 mmol/L (ref 3.5–5.1)
PROTEIN TOTAL: 6.6 g/dL (ref 6.0–8.0)
SODIUM: 139 mmol/L (ref 136–145)

## 2020-11-20 NOTE — Telephone Encounter (Signed)
Patients labs resulted GFR is 51 Called CT spoke to [place] and she states he is good for his CT on Monday morning. Called patient to let him know he is good for his CT scan. Patient denies any further needs.

## 2020-11-22 NOTE — Cancer Center Note (Incomplete)
[facility]  [address]   [place], [place] [address]   [place]  Date of Birth: [birth year]  MRN: [id number]  Date of Service:  11/24/2020  Referring Provider: Milinda Cave, MD     Chief Complaint: No chief complaint on file.     HPI:  [name] is a 75 y.o. who presents to the [place] at [facility] for evaluation and opinion regarding the venous thrombosis of right lower extremity.     He has significant past medical history of prostate cancer which has been treated with radiation therapy in 2017 and since then his prostate cancer is controlled as per patient with less than 0.1 of PSA.  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  He has history of intestinal perforation because of diverticulosis.  He was admitted in [place] the hospital in March, with Staphylococcus arthritis of the right hip, had required drainage.  At that time he had duplex study of the right lower extremity and it was negative for any thrombosis.  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  Has been started on apixaban.  Right now he is on apixaban 2.5 mg BD.    His main concern is dizziness, or on for last 2 years, he had spells of dizziness which comes and goes.  With each spell he feel like drained out.  He had cardiac monitoring, result which is not available yet.  Beside that he has seen the ENT doctor and there is decrease in his left ear hearing.       04/19/20    HISTORY: M79.606: Leg pain    Spectral analysis of, grayscale, and color flow imaging of the venous system of the right lower extremity is performed. Normal flow is seen within the common femoral, superficial femoral, and popliteal veins. These show good  augmentation and compressibility. There is no evidence for deep venous thrombosis. The upper calf veins, if visualized, appear normal.    IMPRESSION:  1. There is no evidence for deep venous thrombosis within the right lower extremity.     Oncology History  Oncology History    No history exists.       Past Medical History  Past Medical History:   Diagnosis Date   . Esophageal reflux    . High cholesterol    . HTN (hypertension)    . Hypothyroidism          Past Surgical History:   Procedure Laterality Date   . COLON SURGERY     . 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 Parke Simmers   . HX HERNIA REPAIR     . WRIST SURGERY Right          Current Outpatient Medications   Medication Sig Dispense Refill   . amlodipine besylate (AMLODIPINE ORAL) Take 5 mg by mouth Once a day     . atorvastatin (LIPITOR) 80 mg Oral Tablet Take 80 mg by mouth Every evening Take one half pill every morning with breakfast     . cholecalciferol, vitamin D3, 25 mcg (1,000 unit) Oral Tablet Take 1,000  Units by mouth Once a day     . docusate sodium (COLACE) 100 mg Oral Capsule Take 100 mg by mouth Twice daily     . elviteg-cob-emtri-tenof ALAFEN (GENVOYA) 150-150-200-10 mg Oral Tablet Every one hour     . gabapentin (NEURONTIN) 300 mg Oral Capsule Take 1 Tab by mouth Three times a day     . HYDROcodone-acetaminophen (NORCO) 5-325 mg Oral Tablet Take 1 Tablet by mouth Every 4 hours as needed 15 Tablet 0   . levothyroxine (SYNTHROID) 88 mcg Oral Tablet Take 88 mcg by mouth Every morning     . melatonin 10 mg Oral Capsule Take by mouth     . omega-3-DHA-EPA-fish oil 1,000 mg (120 mg-180 mg) Oral Capsule Take by mouth Once a day     . omeprazole (PRILOSEC) 20 mg Oral Capsule, Delayed Release(E.C.) Take 20 mg by mouth Twice daily     . tamsulosin (FLOMAX) 0.4 mg Oral Capsule Take 0.4 mg by mouth       No current facility-administered medications for this visit.     No Known  Allergies  Social History     Tobacco Use   . Smoking status: Never   . Smokeless tobacco: Never   Substance Use Topics   . Alcohol use: Not Currently      Family Medical History:     Problem Relation (Age of Onset)    Cancer Mother, Father, Other    Heart Attack Mother    High Cholesterol Other    Stroke Other          Review of Systems  As above  Physical Examination:   Most Recent [place] Office Visit from 07/31/2020 in Orthopaedics, [facility]   Temperature --   Heart Rate --   Respiratory Rate --   BP (Non-Invasive) 128/64 filed at... 07/31/2020 0818   SpO2 --   Height 1.778 m (5\' 10" ) filed at... 07/31/2020 0818   Weight 79.4 kg (175 lb) filed at... 07/31/2020 0818   BMI (Calculated) 25.16 filed at... 07/31/2020 0818   BSA (Calculated) 1.98 filed at... 07/31/2020 0818      Performance Status:  0  General appearance: well appearing, alert, in no acute distress.  Skin: skin color, texture, tugor normal, no rashes or lesions  Head: nomocephalic, no masses, lesions, tendernesss or abdomalities  Eyes: Anicteric sclera.  Extraocular movements are intact  Ears: external ears normal  Oropharynx: Oropharynx clear, no erthema, no thrush.  Neck: Supple,thyroid not palpable  Lymphatic: no palpable lymphadenopathy  CVS: S1 and S2, no audible murmur/ rub  Chest: Clear to auscultation bilaterally  Abdomen: Soft, non tender, non distended, no hepatosplenomegaly. Bowel sounds audible, midline scar.   Gynecology and Rectal exam: Deferred  ZCH:YIFOY, oriented and grossly intact    LABS  Results for orders placed or performed during the hospital encounter of 11/20/20 (from the past 72 hour(s))   COMPREHENSIVE METABOLIC PANEL, NON-FASTING   Result Value Ref Range    SODIUM 139 136 - 145 mmol/L    POTASSIUM 3.6 3.5 - 5.1 mmol/L    CHLORIDE 106 96 - 111 mmol/L    CO2 TOTAL 24 23 - 31 mmol/L    ANION GAP 9 4 - 13 mmol/L    BUN 14 8 - 25 mg/dL    CREATININE 1.43 (H) 0.75 - 1.35 mg/dL    BUN/CREA RATIO 10 6  - 22    ESTIMATED GFR 51 (L) >=  60 mL/min/BSA    ALBUMIN 3.9 3.4 - 4.8 g/dL     CALCIUM 9.6 8.8 - 10.2 mg/dL    GLUCOSE 136 (H) 65 - 125 mg/dL    ALKALINE PHOSPHATASE 105 45 - 115 U/L    ALT (SGPT) 19 10 - 55 U/L    AST (SGOT)  15 8 - 45 U/L    BILIRUBIN TOTAL 0.6 0.3 - 1.3 mg/dL    PROTEIN TOTAL 6.6 6.0 - 8.0 g/dL     Outside lab CBC is within normal limits creatinine is 1.22 LFTs are within normal limit LDH is slightly elevated.   Radiology  No results were found from the past 30 days.    Last Mammogram (if applicable): No results found for this or any previous visit (from the past 17520 hour(s)).    Last PetScan (if applicable): No results found for this or any previous visit (from the past 17520 hour(s)).    Recent Pathology          Assessment and Plan  No diagnosis found.  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  This DVT may be unprovoked or it may be related to his previous hip surgery.    As far as his prostate cancer, it has been treated with radiation therapy in 2017, in March of this year was 0.27 will repeat one in the next visit.    He has chronic dizziness etiology not clear, may be related to middle ear, he is already following the ENT doctor, also had cardiac monitor and following with the primary care physician.  I will refer him to Neurology.       Milinda Cave, MD    This note was partially generated using MModal Fluency Direct system, and there may be some incorrect words, spellings, and punctuation that were not noted in checking the note before saving.

## 2020-11-23 ENCOUNTER — Inpatient Hospital Stay (INDEPENDENT_AMBULATORY_CARE_PROVIDER_SITE_OTHER)
Admission: RE | Admit: 2020-11-23 | Discharge: 2020-11-23 | Disposition: A | Discharge status: Home / Self Care | Payer: Medicare Other | Source: Ambulatory Visit | Attending: Family Medicine | Admitting: Family Medicine

## 2020-11-23 ENCOUNTER — Other Ambulatory Visit: Payer: Self-pay

## 2020-11-23 DIAGNOSIS — I82441 Acute embolism and thrombosis of right tibial vein: Secondary | ICD-10-CM

## 2020-11-23 DIAGNOSIS — R42 Dizziness and giddiness: Secondary | ICD-10-CM

## 2020-11-24 ENCOUNTER — Encounter (INDEPENDENT_AMBULATORY_CARE_PROVIDER_SITE_OTHER): Payer: Medicare Other | Admitting: HEMATOLOGY/ONCOLOGY

## 2020-12-10 NOTE — Cancer Center Note (Signed)
[name]  [address]   [name], [name] [address]   [name]  Date of Birth: 12/08/1945  MRN: [id number]  Date of Service:  12/11/2020  Referring Provider: Milinda Cave, MD     Chief Complaint: No chief complaint on file.     HPI:  [name] is a 75 y.o. who presents to the [name] for evaluation and opinion regarding the venous thrombosis of right lower extremity.     He has significant past medical history of prostate cancer which has been treated with radiation therapy in 2017 and since then his prostate cancer is controlled as per patient with less than 0.1 of PSA.  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  He has history of intestinal perforation because of diverticulosis.  [facility] was [name] in [facility], with Staphylococcus arthritis of the right hip, had required drainage.  At that time he had duplex study of the right lower extremity and it was negative for any thrombosis.  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  Has been started on apixaban.  Right now he is on apixaban 2.5 mg BD.    Today he came to discuss the result of CT scan of the chest and to discuss how long he should continue the anticoagulation.        04/19/20    HISTORY: M79.606: Leg pain    Spectral analysis of, grayscale, and color flow imaging of the venous system of the right lower extremity is performed. Normal flow is seen within the common femoral, superficial femoral, and popliteal veins. These show good augmentation and compressibility. There is no evidence for deep venous thrombosis. The upper calf veins, if visualized, appear normal.    IMPRESSION:  1. There is no evidence for  deep venous thrombosis within the right lower extremity.     Oncology History  Oncology History    No history exists.       Past Medical History  Past Medical History:   Diagnosis Date   . Esophageal reflux    . High cholesterol    . HTN (hypertension)    . Hypothyroidism          Past Surgical History:   Procedure Laterality Date   . COLON SURGERY     . 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 Parke [name]   . HX HERNIA REPAIR     . WRIST SURGERY Right          Current Outpatient Medications   Medication Sig Dispense Refill   . amlodipine besylate (AMLODIPINE ORAL) Take 5 mg by mouth Once a day     . atorvastatin (LIPITOR) 80 mg Oral Tablet Take 80 mg by mouth Every evening Take one half pill every morning with breakfast     . cholecalciferol, vitamin D3, 25 mcg (1,000 unit) Oral Tablet Take 1,000 Units by mouth Once a day     . docusate sodium (COLACE) 100 mg Oral Capsule Take 100 mg by mouth Twice daily     . elviteg-cob-emtri-tenof ALAFEN (GENVOYA) 150-150-200-10  mg Oral Tablet Every one hour     . gabapentin (NEURONTIN) 300 mg Oral Capsule Take 1 Tab by mouth Three times a day     . HYDROcodone-acetaminophen (NORCO) 5-325 mg Oral Tablet Take 1 Tablet by mouth Every 4 hours as needed 15 Tablet 0   . levothyroxine (SYNTHROID) 88 mcg Oral Tablet Take 88 mcg by mouth Every morning     . melatonin 10 mg Oral Capsule Take by mouth     . omega-3-DHA-EPA-fish oil 1,000 mg (120 mg-180 mg) Oral Capsule Take by mouth Once a day     . omeprazole (PRILOSEC) 20 mg Oral Capsule, Delayed Release(E.C.) Take 20 mg by mouth Twice daily     . tamsulosin (FLOMAX) 0.4 mg Oral Capsule Take 0.4 mg by mouth       No current facility-administered medications for this visit.     No Known Allergies  Social History     Tobacco Use   . Smoking status: Never   . Smokeless tobacco: Never   Substance Use Topics   . Alcohol use: Not Currently      Family Medical History:      Problem Relation (Age of Onset)    Cancer Mother, Father, Other    Heart Attack Mother    High Cholesterol Other    Stroke Other          Review of Systems  As above  Physical Examination:   Most Recent [place] Office Visit from 12/11/2020 in Oncology, Medical Office   [facility]   Temperature 35.7 C (96.2 F) filed at... 12/11/2020 1149   Heart Rate 72 filed at... 12/11/2020 1149   Respiratory Rate --   BP (Non-Invasive) 114/71 filed at... 12/11/2020 1149   SpO2 95 % filed at... 12/11/2020 1149   Height 1.803 m (5\' 11" ) filed at... 12/11/2020 1149   Weight 82.1 kg (181 lb) filed at... 12/11/2020 1149   BMI (Calculated) 25.3 filed at... 12/11/2020 1149   BSA (Calculated) 2.03 filed at... 12/11/2020 1149      Performance Status:  0  General appearance: well appearing, alert, in no acute distress.  Skin: skin color, texture, tugor normal, no rashes or lesions  Head: nomocephalic, no masses, lesions, tendernesss or abdomalities  Eyes: Anicteric sclera.  Extraocular movements are intact  Ears: external ears normal  Oropharynx: Oropharynx clear, no erthema, no thrush.  Neck: Supple,thyroid not palpable  JJK:KXFGH, oriented and grossly intact    LABS  No results found for this or any previous visit (from the past 72 hour(s)).  Outside lab CBC is within normal limits creatinine is 1.22 LFTs are within normal limit LDH is slightly elevated.   Radiology  No results were found from the past 30 days.    Last Mammogram (if applicable): No results found for this or any previous visit (from the past 17520 hour(s)).    Last PetScan (if applicable): No results found for this or any previous visit (from the past 17520 hour(s)).    Recent Pathology          Assessment and Plan    ICD-10-CM    1. 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  I  advised him to continue anticoagulation 3-6 months in total.  Eliquis 2.5 mg BD is enough for him.    If he will have recurrent thromboembolic event then he will need in definitive anticoagulation.    He was asking about pancreatic mass probably benign in nature we will refer to pancreatobiliary surgery services at [facility].    Patient has been discharged from the clinic will see as needed basis.     [name] [facility], MD    This note was partially generated using MModal Fluency Direct system, and there may be some incorrect words, spellings, and punctuation that were not noted in checking the note before saving.

## 2020-12-11 ENCOUNTER — Other Ambulatory Visit: Payer: Self-pay

## 2020-12-11 ENCOUNTER — Encounter (INDEPENDENT_AMBULATORY_CARE_PROVIDER_SITE_OTHER): Payer: Self-pay | Admitting: HEMATOLOGY/ONCOLOGY

## 2020-12-11 ENCOUNTER — Ambulatory Visit: Payer: 59 | Attending: HEMATOLOGY/ONCOLOGY | Admitting: HEMATOLOGY/ONCOLOGY

## 2020-12-11 VITALS — BP 114/71 | HR 72 | Temp 96.2°F | Ht 71.0 in | Wt 181.0 lb

## 2020-12-11 DIAGNOSIS — Z809 Family history of malignant neoplasm, unspecified: Secondary | ICD-10-CM | POA: Insufficient documentation

## 2020-12-11 DIAGNOSIS — Z8546 Personal history of malignant neoplasm of prostate: Secondary | ICD-10-CM | POA: Insufficient documentation

## 2020-12-11 DIAGNOSIS — K8689 Other specified diseases of pancreas: Secondary | ICD-10-CM | POA: Insufficient documentation

## 2020-12-15 ENCOUNTER — Other Ambulatory Visit (INDEPENDENT_AMBULATORY_CARE_PROVIDER_SITE_OTHER): Payer: Self-pay | Admitting: SURGICAL ONCOLOGY

## 2020-12-15 DIAGNOSIS — K8689 Other specified diseases of pancreas: Secondary | ICD-10-CM

## 2020-12-15 NOTE — Nursing Note (Signed)
This Surgical Oncology Planning Team (attending surgeons, nurse oncology coordinator, APP and schedulers) met today and reviewed all available patient records and imaging studies pertinent to the upcoming ambulatory visit. Based upon the available information, the following plan will be communicated to the patient including medically necessary tests prior to or on the day of the initial visit.    Dx:  Pancreas mass     Plan:    1. New patient visit with history and physical exam and surgical oncology consultation  2. Laboratory studies including CBC, CMP, coagulation studies and tumor markers  3.       MRI pancreas protcol      Mila Homer, CLINICAL CARE COORDINATOR  12/15/2020, 14:25

## 2020-12-21 ENCOUNTER — Other Ambulatory Visit: Payer: Self-pay

## 2020-12-21 ENCOUNTER — Other Ambulatory Visit: Payer: Medicare Other | Attending: INFECTIOUS DISEASE | Admitting: INFECTIOUS DISEASE

## 2020-12-21 ENCOUNTER — Other Ambulatory Visit (INDEPENDENT_AMBULATORY_CARE_PROVIDER_SITE_OTHER): Payer: Medicare Other

## 2020-12-21 DIAGNOSIS — B20 Human immunodeficiency virus [HIV] disease: Secondary | ICD-10-CM | POA: Insufficient documentation

## 2020-12-21 LAB — MANUAL DIFFERENTIAL
LYMPHOCYTE %: 16 % — ABNORMAL LOW (ref 25–40)
LYMPHOCYTE ABSOLUTE: 1.26 10*3/uL (ref 1.00–3.70)
MONOCYTE %: 3 % (ref 0–10)
MONOCYTE ABSOLUTE: 0.24 10*3/uL (ref 0.00–0.70)
NEUTROPHIL %: 81 % (ref 37–81)
NEUTROPHIL ABSOLUTE: 6.4 10*3/uL (ref 1.50–7.00)
PLATELET MORPHOLOGY COMMENT: NORMAL
WBC: 7.9 10*3/uL

## 2020-12-21 LAB — CBC WITH DIFF
HCT: 46.4 % (ref 40.7–52.3)
HGB: 14.9 g/dL (ref 12.5–16.9)
MCH: 30 pg (ref 27.0–33.4)
MCHC: 32.1 g/dL (ref 31.0–37.0)
MCV: 93.4 fL (ref 84.1–99.3)
PLATELETS: 246 10*3/uL (ref 129–381)
RBC: 4.97 10*6/uL (ref 4.09–5.89)
RDW: 14.5 % (ref 10.8–15.2)
WBC: 7.9 10*3/uL (ref 2.6–9.8)

## 2020-12-21 LAB — COMPREHENSIVE METABOLIC PANEL, NON-FASTING
ALBUMIN: 4.9 g/dL (ref 3.5–5.0)
ALKALINE PHOSPHATASE: 130 U/L — ABNORMAL HIGH (ref 38–126)
ALT (SGPT): 20 U/L (ref <=50)
ANION GAP: 12 mmol/L
AST (SGOT): 21 U/L (ref 17–59)
BILIRUBIN TOTAL: 0.4 mg/dL (ref 0.2–1.3)
BUN/CREA RATIO: 12
BUN: 17 mg/dL (ref 9–20)
CALCIUM: 9.7 mg/dL (ref 8.4–10.2)
CHLORIDE: 101 mmol/L (ref 98–107)
CO2 TOTAL: 26 mmol/L (ref 22–30)
CREATININE: 1.45 mg/dL — ABNORMAL HIGH (ref 0.66–1.25)
ESTIMATED GFR: 50 mL/min/{1.73_m2} — ABNORMAL LOW (ref >=60)
GLUCOSE: 228 mg/dL — ABNORMAL HIGH (ref 74–106)
POTASSIUM: 3.8 mmol/L (ref 3.5–5.1)
PROTEIN TOTAL: 7.6 g/dL (ref 6.3–8.2)
SODIUM: 139 mmol/L (ref 137–145)

## 2020-12-21 LAB — LIPID PANEL
CHOLESTEROL: 212 mg/dL — ABNORMAL HIGH (ref 0–199)
HDL CHOL: 44 mg/dL (ref 40–60)
LDL CALC: 96 mg/dL (ref 0–130)
TRIGLYCERIDES: 361 mg/dL — ABNORMAL HIGH (ref 0–149)
VLDL CALC: 72 mg/dL

## 2020-12-21 LAB — PT/INR
INR: 1 (ref <=4.00)
PROTHROMBIN TIME: 10.2 seconds (ref 9.4–11.9)

## 2020-12-21 LAB — PTT (PARTIAL THROMBOPLASTIN TIME): APTT: 23.1 seconds (ref 22.1–31.9)

## 2020-12-21 NOTE — Ancillary Notes (Signed)
Department of Community Practice     Venipuncture performed in office on left arm antecubital vein, dry pressure dressing was applied to site and patient tolerated it well.  Specimen was centrifuged, aliquoted as needed and specimen was labeled and packaged for transport.    Boone Master  12/21/2020, 14:52

## 2020-12-22 LAB — SYPHILIS, RAPID PLASMIN REAGIN (RPR), WITH TITER REFLEX: RPR QUALITATIVE: NONREACTIVE

## 2020-12-23 ENCOUNTER — Encounter (INDEPENDENT_AMBULATORY_CARE_PROVIDER_SITE_OTHER): Payer: Self-pay | Admitting: SURGICAL ONCOLOGY

## 2020-12-23 ENCOUNTER — Ambulatory Visit (INDEPENDENT_AMBULATORY_CARE_PROVIDER_SITE_OTHER)
Admission: RE | Admit: 2020-12-23 | Discharge: 2020-12-23 | Disposition: A | Discharge status: Home / Self Care | Payer: 59 | Source: Ambulatory Visit

## 2020-12-23 ENCOUNTER — Other Ambulatory Visit: Payer: Self-pay

## 2020-12-23 ENCOUNTER — Ambulatory Visit: Payer: 59 | Attending: SURGICAL ONCOLOGY | Admitting: SURGICAL ONCOLOGY

## 2020-12-23 DIAGNOSIS — Z809 Family history of malignant neoplasm, unspecified: Secondary | ICD-10-CM | POA: Insufficient documentation

## 2020-12-23 DIAGNOSIS — N281 Cyst of kidney, acquired: Secondary | ICD-10-CM

## 2020-12-23 DIAGNOSIS — K862 Cyst of pancreas: Secondary | ICD-10-CM

## 2020-12-23 DIAGNOSIS — R140 Abdominal distension (gaseous): Secondary | ICD-10-CM

## 2020-12-23 DIAGNOSIS — Q446 Cystic disease of liver: Secondary | ICD-10-CM

## 2020-12-23 DIAGNOSIS — K8689 Other specified diseases of pancreas: Secondary | ICD-10-CM

## 2020-12-23 LAB — QUANTIFERON TB MITOGEN: QFT MITOGEN: 10 IU/mL

## 2020-12-23 LAB — CD4/CD8
CD3 # (T CELL): 862 cells/uL — ABNORMAL LOW (ref 892–2436)
CD3 % (T CELL): 68 %
CD4 # (HELPER T CELL): 272 cells/uL — ABNORMAL LOW (ref 382–1614)
CD4 % (HELPER T CELL): 22 %
CD4:CD8 RATIO: 0.5 — ABNORMAL LOW (ref 1.0–3.4)
CD8 # (SUPPRESSOR T CELL): 603 cells/uL (ref 157–813)
CD8 % (SUPPRESSOR T CELL): 48 %

## 2020-12-23 LAB — QUANTIFERON: QFT QUALITATIVE: NEGATIVE

## 2020-12-23 LAB — QUANTIFERON TB1: QFT TB1: 0 IU/mL

## 2020-12-23 LAB — QUANTIFERON TB2: QFT TB2: 0.01 IU/mL

## 2020-12-23 LAB — QUANTIFERON TB NIL: QFT NIL: 0.0066 IU/mL

## 2020-12-23 MED ORDER — GADOBUTROL 1 MMOL/ML (604.72 MG/ML) INTRAVENOUS SOLUTION
8.0000 mL | INTRAVENOUS | Status: AC
Start: 2020-12-23 — End: 2020-12-23
  Administered 2020-12-23: 8 mL via INTRAVENOUS

## 2020-12-23 NOTE — H&P (Signed)
SURGICAL ONCOLOGY INITIAL EVALUATION:    Patient: [name]  D.O.B.: August 10, 1945  MRN# [id number]  Date of Service: 12/23/2020    Referring Provider: Milinda Cave, MD    PCP: [facility]    Chief Complaint: Referral for pancreatic mass    HPI  [name] is a 75 y.o. male with PMH of prostate cancer s/p radiation (2017), HIV controlled with Genvoya, minimal viral load and CD 4 count > 500, s/p colectomy for diverticular perforation (2006), and recent hospital admission for septic arthrtis of the hip and DVT who is referred by Dr. Milinda Cave for consultation of a pancreatic mass in the distal tail. The patient noted that the 3 small pancreatic masses in the distal tail was first discovered incidentally post op of his colectomy back in 2006. The patient noted years later (2017) for the treatment of his prostate cancer that his pancreatic mass showed that mass grew into a single 7 cm mass that was found incidently CTAP. 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 The patient also a FNA in 2017 which showed begingn etiology, as well as radiology reports from the [place] in [place] during that time. The patient expresses concern with reoccurring findings during admissions was referred for adequate surveillance and monitoring of the mass.     Allergies:  No Known Allergies    Current Outpatient Prescriptions  Outpatient Medications Marked as Taking for the 12/23/20 encounter (Office Visit) with Nicola Police, MD   Medication Sig    amlodipine besylate (AMLODIPINE ORAL) Take 5 mg by mouth Once a day    atorvastatin (LIPITOR) 80 mg Oral Tablet Take 80 mg by mouth Every evening Take one half pill every morning with  breakfast    cholecalciferol, vitamin D3, 25 mcg (1,000 unit) Oral Tablet Take 1,000 Units by mouth Once a day    docusate sodium (COLACE) 100 mg Oral Capsule Take 100 mg by mouth Twice daily    elviteg-cob-emtri-tenof ALAFEN (GENVOYA) 150-150-200-10 mg Oral Tablet Every one hour    gabapentin (NEURONTIN) 300 mg Oral Capsule Take 1 Tab by mouth Three times a day    levothyroxine (SYNTHROID) 88 mcg Oral Tablet Take 88 mcg by mouth Every morning    melatonin 10 mg Oral Capsule Take by mouth    omega-3-DHA-EPA-fish oil 1,000 mg (120 mg-180 mg) Oral Capsule Take by mouth Once a day    omeprazole (PRILOSEC) 20 mg Oral Capsule, Delayed Release(E.C.) Take 20 mg by mouth Twice daily    tamsulosin (FLOMAX) 0.4 mg Oral Capsule Take 0.4 mg by mouth        Past Medical History:    Past Medical History:   Diagnosis Date    Esophageal reflux     High cholesterol     HTN (hypertension)     Hypothyroidism            Past Surgical History:    Past Surgical History:   Procedure Laterality Date    COLON SURGERY      HIP SURGERY Right 04/22/2020    Right hip arthrotomy 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  The patient is a English as a second language teacher.  Patient has no history of smoking or drinking.  The patient is currently married.    ROS:  General: Denies fever, chills, or any changes in weight.   EENT: Denies trouble with swallowing.  Cardiovascular: Denies chest pain, pain on exertion, or palpitations.  Respiratory: Denies SOB, cough.  Gastrointestinal: Denies abdominal pain, N/V. The patient has intermittent constipation  GU: Denies change in urinary habits.  Hematologic: Has a DVT in the right  tibial vein.Marland Kitchen  Psychiatric: Denies changes in mood, anxiety, or depression.    Objective:  BP 128/78    Pulse 88    Temp 36.9 C (98.5 F)    Ht 1.803 m (5\' 11" )    Wt 81.5 kg (179 lb 10.8 oz)    SpO2 95%    BMI 25.06 kg/m         Physical Exam:  Constitutional: Very pleasant, AAOx3, NAD  HEENT: Normocephalic, atraumatic. No scleral icterus.  Neck: Trachea midline.   Pulmonary: No respiratory distress, no retractions. Normal respiratory effort.   Abdomen: Abdomen soft and supple; No tenderness. Non-distended; No masses or HSM present. Large midline incision from prior colecetomy  Extremities: No peripheral edema; No cyanosis or clubbing of nails.  Skin: No rashes or lesions present; Warm and dry. No jaundice.  Psychiatric: Normal mood and affect; Judgement and thought content normal.    Data:  Labs:   Latest Reference Range & Units 12/21/20 14:53   TOTAL PROTEIN 6.3 - 8.2 g/dL 7.6   ALBUMIN 3.5 - 5.0 g/dL 4.9   BILIRUBIN, TOTAL 0.2 - 1.3 mg/dL 0.4   AST (SGOT) 17 - 59 U/L 21   ALT (SGPT) <=50 U/L 20   ALKALINE PHOSPHATASE 38 - 126 U/L 130 (H)   (H): Data is abnormally high      Imaging studies:  MRI ABDOMEN (LIVER,PANCREAS,MRCP) W/WO CON  12/23/2020  IMPRESSION:  1.Large multicystic mass in the distal pancreatic body-tail measuring up to 6 cm with imaging characteristic most compatible with serous cystadenoma, however given the size of the lesion and side branch IPMN remaining in the differential, recommend gastroenterology consultation for evaluation for EUS/FNA. No suspicious peripancreatic lymph nodes.  2.Multiple cystic lesions in the pancreatic head and uncinate process as well as in the distal body, separate from the above described mass, could be related to side branch IPMNs. No worrisome imaging features identified within these lesions. No associated duct dilation.  3.Few simple liver cysts, largest in the left hepatic lobe measures 1.3 cm, similar to recent CT.  4.Multiple bilateral renal cysts, similar to  recent CT.    CT ANGIO CHEST W IV CONTRAST  11/23/2020  1. No pulmonary embolus, focal infiltrate, or worrisome pulmonary mass.   2. Mild cardiomegaly with suspected bilateral dependent atelectasis versus mild scarring and/or fibrosis.   3. 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   4. Stable small hepatic cysts.     CT ABDOMEN PELVIS W IV CONTRAST  04/19/2020  IMPRESSION:  1. 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 GI consult would be recommended for potential scope/ERCP and washings. No definite evidence to suggest distal metastasis is seen.  2. There are some hypodensities involving the liver with the larger likely representing cysts although there are scattered smaller areas that are too small to further characterize.  3. There is fatty density along the right femoral vascular structures likely representing a small femoral hernia. Patient also has a fat-containing left inguinal hernia which is small.  4. Cystic changes are noted at the kidneys.  5. Mild bibasilar atelectasis and cardiomegaly are noted.    Assessment/Plan  Dicussed with  patient the mass on most recent imaging on MRI is most consist with serous cystadenoma. Discussed scheduling EUS w/ FNA and patient. The patient was interested in moving forward with the procedure for definitve diagnosis. He will return to clinic in as needed after for EUS/ FNA of cyst. He was given the opportunity to ask questions, and those questions were appropriately answered. He/She agreed with the treatment plan and was encouraged to call with any additional questions or concerns.     Teodoro Spray, MED STUDENT 3 12/23/2020, 14:11  [facility] Department of Surgery  Division of Surgical Oncology    I was present when the student was taking history, performing  the exam, and during any medical decision making activities. I personally verified the history, performed the exam, and medical decision making as edited in the students note. I agree with the medical students note.   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  We have been unable to locate the cyst fluid analysis from the [place] to confirm that it is not a mucinous cyst.  I discussed the differential and impact on diagnosis, prognosis and treatment options.  He strongly desires to obtain a definitive diagnosis so he can no longer follow this lesion if necessary.  We will refer for EUS here to complete the workup.  All his questions were answered and he agreed with the plan. We will follow up pending these results.     Lynwood Dawley, MD, FACS

## 2020-12-25 LAB — HIV-1 RNA QUANTITATIVE PCR, PLASMA: HIV1 RNA VIRAL LOAD: NOT DETECTED

## 2020-12-30 ENCOUNTER — Ambulatory Visit (INDEPENDENT_AMBULATORY_CARE_PROVIDER_SITE_OTHER): Payer: Self-pay | Admitting: SURGICAL ONCOLOGY

## 2020-12-30 DIAGNOSIS — K862 Cyst of pancreas: Secondary | ICD-10-CM

## 2020-12-30 NOTE — Telephone Encounter (Signed)
Order placed for EUS per Dr.Boone. This nurse spoke with pt and advised.  Rudene Anda, RN BSN 12/30/2020 11:20    Rudene Anda, RN BSN  Clinical Nurse Coordinator  Division of Surgical Oncology  [place]: [phone]  Fax: [phone]

## 2021-01-01 ENCOUNTER — Inpatient Hospital Stay (HOSPITAL_COMMUNITY)
Admission: RE | Admit: 2021-01-01 | Discharge: 2021-01-01 | Disposition: A | Discharge status: Home / Self Care | Payer: 59 | Source: Ambulatory Visit

## 2021-01-01 ENCOUNTER — Encounter (HOSPITAL_COMMUNITY): Payer: Self-pay

## 2021-01-01 ENCOUNTER — Other Ambulatory Visit: Payer: Self-pay

## 2021-01-01 HISTORY — DX: Hyperlipidemia, unspecified: E78.5

## 2021-01-01 HISTORY — DX: Personal history of other diseases of the nervous system and sense organs: Z86.69

## 2021-01-01 HISTORY — DX: Acute embolism and thrombosis of unspecified deep veins of unspecified lower extremity: I82.409

## 2021-01-01 HISTORY — DX: Personal history of other diseases of urinary system: Z87.448

## 2021-01-01 HISTORY — DX: Malignant (primary) neoplasm, unspecified: C80.1

## 2021-01-02 ENCOUNTER — Inpatient Hospital Stay (HOSPITAL_COMMUNITY): Payer: 59 | Admitting: Internal Medicine

## 2021-01-02 ENCOUNTER — Encounter (HOSPITAL_COMMUNITY): Payer: Self-pay

## 2021-01-02 ENCOUNTER — Inpatient Hospital Stay
Admission: EM | Admit: 2021-01-02 | Discharge: 2021-01-07 | DRG: 872 | Disposition: A | Discharge status: Home Health | Payer: 59 | Attending: Internal Medicine | Admitting: Internal Medicine

## 2021-01-02 ENCOUNTER — Other Ambulatory Visit: Payer: Self-pay

## 2021-01-02 ENCOUNTER — Emergency Department (HOSPITAL_COMMUNITY): Payer: 59

## 2021-01-02 DIAGNOSIS — L03818 Cellulitis of other sites: Secondary | ICD-10-CM

## 2021-01-02 DIAGNOSIS — Z8546 Personal history of malignant neoplasm of prostate: Secondary | ICD-10-CM

## 2021-01-02 DIAGNOSIS — E782 Mixed hyperlipidemia: Secondary | ICD-10-CM | POA: Diagnosis present

## 2021-01-02 DIAGNOSIS — L02214 Cutaneous abscess of groin: Secondary | ICD-10-CM | POA: Diagnosis present

## 2021-01-02 DIAGNOSIS — K8689 Other specified diseases of pancreas: Secondary | ICD-10-CM | POA: Diagnosis present

## 2021-01-02 DIAGNOSIS — E872 Acidosis, unspecified: Secondary | ICD-10-CM | POA: Diagnosis present

## 2021-01-02 DIAGNOSIS — K219 Gastro-esophageal reflux disease without esophagitis: Secondary | ICD-10-CM | POA: Diagnosis present

## 2021-01-02 DIAGNOSIS — L0390 Cellulitis, unspecified: Secondary | ICD-10-CM | POA: Diagnosis present

## 2021-01-02 DIAGNOSIS — K869 Disease of pancreas, unspecified: Secondary | ICD-10-CM

## 2021-01-02 DIAGNOSIS — L03314 Cellulitis of groin: Secondary | ICD-10-CM | POA: Diagnosis present

## 2021-01-02 DIAGNOSIS — E785 Hyperlipidemia, unspecified: Secondary | ICD-10-CM

## 2021-01-02 DIAGNOSIS — Z22322 Carrier or suspected carrier of Methicillin resistant Staphylococcus aureus: Secondary | ICD-10-CM

## 2021-01-02 DIAGNOSIS — Z21 Asymptomatic human immunodeficiency virus [HIV] infection status: Secondary | ICD-10-CM | POA: Diagnosis present

## 2021-01-02 DIAGNOSIS — I129 Hypertensive chronic kidney disease with stage 1 through stage 4 chronic kidney disease, or unspecified chronic kidney disease: Secondary | ICD-10-CM | POA: Diagnosis present

## 2021-01-02 DIAGNOSIS — Z923 Personal history of irradiation: Secondary | ICD-10-CM

## 2021-01-02 DIAGNOSIS — E079 Disorder of thyroid, unspecified: Secondary | ICD-10-CM | POA: Diagnosis present

## 2021-01-02 DIAGNOSIS — A419 Sepsis, unspecified organism: Secondary | ICD-10-CM

## 2021-01-02 DIAGNOSIS — A4102 Sepsis due to Methicillin resistant Staphylococcus aureus: Principal | ICD-10-CM | POA: Diagnosis present

## 2021-01-02 DIAGNOSIS — R7881 Bacteremia: Secondary | ICD-10-CM | POA: Diagnosis present

## 2021-01-02 DIAGNOSIS — Z79890 Hormone replacement therapy (postmenopausal): Secondary | ICD-10-CM

## 2021-01-02 DIAGNOSIS — N182 Chronic kidney disease, stage 2 (mild): Secondary | ICD-10-CM | POA: Diagnosis present

## 2021-01-02 DIAGNOSIS — B9561 Methicillin susceptible Staphylococcus aureus infection as the cause of diseases classified elsewhere: Secondary | ICD-10-CM

## 2021-01-02 DIAGNOSIS — Z8614 Personal history of Methicillin resistant Staphylococcus aureus infection: Secondary | ICD-10-CM

## 2021-01-02 DIAGNOSIS — R21 Rash and other nonspecific skin eruption: Secondary | ICD-10-CM | POA: Diagnosis present

## 2021-01-02 DIAGNOSIS — I10 Essential (primary) hypertension: Secondary | ICD-10-CM | POA: Diagnosis present

## 2021-01-02 DIAGNOSIS — B20 Human immunodeficiency virus [HIV] disease: Secondary | ICD-10-CM | POA: Diagnosis present

## 2021-01-02 DIAGNOSIS — N400 Enlarged prostate without lower urinary tract symptoms: Secondary | ICD-10-CM | POA: Diagnosis present

## 2021-01-02 DIAGNOSIS — B9689 Other specified bacterial agents as the cause of diseases classified elsewhere: Secondary | ICD-10-CM | POA: Diagnosis present

## 2021-01-02 DIAGNOSIS — Z86718 Personal history of other venous thrombosis and embolism: Secondary | ICD-10-CM

## 2021-01-02 DIAGNOSIS — E039 Hypothyroidism, unspecified: Secondary | ICD-10-CM

## 2021-01-02 HISTORY — DX: Carrier or suspected carrier of methicillin resistant Staphylococcus aureus: Z22.322

## 2021-01-02 LAB — CBC WITH DIFF
BASOPHIL #: <0.1 10*3/uL (ref <=0.20)
BASOPHIL %: 0 %
EOSINOPHIL #: <0.1 10*3/uL (ref <=0.50)
EOSINOPHIL %: 0 %
HCT: 42.6 % (ref 38.9–52.0)
HGB: 14.3 g/dL (ref 13.4–17.5)
IMMATURE GRANULOCYTE #: 0.38 10*3/uL — ABNORMAL HIGH (ref <0.10)
IMMATURE GRANULOCYTE %: 2 % — ABNORMAL HIGH (ref 0–1)
LYMPHOCYTE #: 1.37 10*3/uL (ref 1.00–4.80)
LYMPHOCYTE %: 6 %
MCH: 30.3 pg (ref 26.0–32.0)
MCHC: 33.6 g/dL (ref 31.0–35.5)
MCV: 90.3 fL (ref 78.0–100.0)
MONOCYTE #: 1.32 10*3/uL — ABNORMAL HIGH (ref 0.20–1.10)
MONOCYTE %: 6 %
MPV: 10.3 fL (ref 8.7–12.5)
NEUTROPHIL #: 21.01 10*3/uL — ABNORMAL HIGH (ref 1.50–7.70)
NEUTROPHIL %: 86 %
PLATELETS: 159 10*3/uL (ref 150–400)
RBC: 4.72 10*6/uL (ref 4.50–6.10)
RDW-CV: 13.6 % (ref 11.5–15.5)
WBC: 24.1 10*3/uL — ABNORMAL HIGH (ref 3.7–11.0)

## 2021-01-02 LAB — URINALYSIS, MACROSCOPIC
BILIRUBIN: NOT DETECTED mg/dL
BLOOD: NOT DETECTED mg/dL
KETONES: NOT DETECTED mg/dL
LEUKOCYTES: NOT DETECTED WBCs/uL
NITRITE: NOT DETECTED
PH: 5 — ABNORMAL LOW (ref 5.0–?)
PROTEIN: NOT DETECTED mg/dL
SPECIFIC GRAVITY: 1.018 (ref 1.005–?)
UROBILINOGEN: NOT DETECTED mg/dL

## 2021-01-02 LAB — URINALYSIS, MICROSCOPIC

## 2021-01-02 LAB — LACTIC ACID LEVEL W/ REFLEX FOR LEVEL >2.0: LACTIC ACID: 2.4 mmol/L — ABNORMAL HIGH (ref 0.5–2.2)

## 2021-01-02 LAB — COMPREHENSIVE METABOLIC PANEL, NON-FASTING
ALBUMIN: 3.1 g/dL — ABNORMAL LOW (ref 3.4–4.8)
ALKALINE PHOSPHATASE: 76 U/L (ref 45–115)
ALT (SGPT): 32 U/L (ref 10–55)
ANION GAP: 9 mmol/L (ref 4–13)
AST (SGOT): 16 U/L (ref 8–45)
BILIRUBIN TOTAL: 0.7 mg/dL (ref 0.3–1.3)
BUN/CREA RATIO: 26 — ABNORMAL HIGH (ref 6–22)
BUN: 31 mg/dL — ABNORMAL HIGH (ref 8–25)
CALCIUM: 8.5 mg/dL — ABNORMAL LOW (ref 8.8–10.2)
CHLORIDE: 106 mmol/L (ref 96–111)
CO2 TOTAL: 20 mmol/L — ABNORMAL LOW (ref 23–31)
CREATININE: 1.17 mg/dL (ref 0.75–1.35)
ESTIMATED GFR: 65 mL/min/BSA (ref >=60)
GLUCOSE: 129 mg/dL — ABNORMAL HIGH (ref 65–125)
POTASSIUM: 4.3 mmol/L (ref 3.5–5.1)
PROTEIN TOTAL: 5.9 g/dL — ABNORMAL LOW (ref 6.0–8.0)
SODIUM: 135 mmol/L — ABNORMAL LOW (ref 136–145)

## 2021-01-02 LAB — LACTIC ACID TIMED: LACTIC ACID: 3 mmol/L — ABNORMAL HIGH (ref 0.5–2.2)

## 2021-01-02 LAB — C-REACTIVE PROTEIN(CRP),INFLAMMATION: CRP INFLAMMATION: 0.7 mg/L (ref <8.0)

## 2021-01-02 MED ORDER — VANCOMYCIN IV - PHARMACIST TO DOSE PER PROTOCOL
Freq: Every day | Status: DC | PRN
Start: 2021-01-02 — End: 2021-01-02

## 2021-01-02 MED ORDER — SODIUM CHLORIDE 0.9 % IV BOLUS
1000.0000 mL | INJECTION | Status: AC
Start: 2021-01-02 — End: 2021-01-02
  Administered 2021-01-02: 14:00:00 0 mL via INTRAVENOUS
  Administered 2021-01-02: 13:00:00 1000 mL via INTRAVENOUS

## 2021-01-02 MED ORDER — MELATONIN 3 MG TABLET
10.0000 mg | ORAL_TABLET | Freq: Every evening | ORAL | Status: DC | PRN
Start: 2021-01-02 — End: 2021-01-08

## 2021-01-02 MED ORDER — TAMSULOSIN 0.4 MG CAPSULE
0.4000 mg | ORAL_CAPSULE | Freq: Two times a day (BID) | ORAL | Status: DC
Start: 2021-01-02 — End: 2021-01-08
  Administered 2021-01-02 – 2021-01-06 (×9): 0.4 mg via ORAL
  Administered 2021-01-07: 21:00:00 0 mg via ORAL
  Administered 2021-01-07: 09:00:00 0.4 mg via ORAL
  Filled 2021-01-02 (×10): qty 1

## 2021-01-02 MED ORDER — SODIUM CHLORIDE 0.9 % (FLUSH) INJECTION SYRINGE
3.0000 mL | INJECTION | INTRAMUSCULAR | Status: DC | PRN
Start: 2021-01-02 — End: 2021-01-05

## 2021-01-02 MED ORDER — MORPHINE 4 MG/ML INTRAVENOUS SOLUTION
4.0000 mg | INTRAVENOUS | Status: AC
Start: 2021-01-02 — End: 2021-01-02
  Administered 2021-01-02: 13:00:00 4 mg via INTRAVENOUS
  Filled 2021-01-02: qty 1

## 2021-01-02 MED ORDER — AMLODIPINE 5 MG TABLET
5.0000 mg | ORAL_TABLET | Freq: Every day | ORAL | Status: DC
Start: 2021-01-02 — End: 2021-01-08
  Administered 2021-01-02 – 2021-01-07 (×6): 5 mg via ORAL
  Filled 2021-01-02 (×6): qty 1

## 2021-01-02 MED ORDER — VANCOMYCIN 5 GRAM INTRAVENOUS SOLUTION
15.0000 mg/kg | INTRAVENOUS | Status: AC
Start: 2021-01-03 — End: 2021-01-04
  Administered 2021-01-03: 19:00:00 0 mg via INTRAVENOUS
  Administered 2021-01-03 (×2): 1250 mg via INTRAVENOUS
  Administered 2021-01-03: 05:00:00 0 mg via INTRAVENOUS
  Administered 2021-01-04: 12:00:00 1250 mg via INTRAVENOUS
  Administered 2021-01-04: 14:00:00 0 mg via INTRAVENOUS
  Filled 2021-01-02 (×3): qty 12.5

## 2021-01-02 MED ORDER — ONDANSETRON HCL (PF) 4 MG/2 ML INJECTION SOLUTION
4.0000 mg | Freq: Four times a day (QID) | INTRAMUSCULAR | Status: DC | PRN
Start: 2021-01-02 — End: 2021-01-08

## 2021-01-02 MED ORDER — PIPERACILLIN-TAZOBACTAM 3.375 GRAM/50 ML DEXTROSE(ISO-OS) IV PIGGYBACK
3.3750 g | INJECTION | INTRAVENOUS | Status: AC
Start: 2021-01-02 — End: 2021-01-02
  Administered 2021-01-02: 11:00:00 3.375 g via INTRAVENOUS
  Administered 2021-01-02: 11:00:00 0 g via INTRAVENOUS
  Filled 2021-01-02: qty 50

## 2021-01-02 MED ORDER — MORPHINE 4 MG/ML INTRAVENOUS SOLUTION
4.0000 mg | INTRAVENOUS | Status: AC
Start: 2021-01-02 — End: 2021-01-03
  Administered 2021-01-02: 10:00:00 4 mg via INTRAVENOUS
  Filled 2021-01-02: qty 1

## 2021-01-02 MED ORDER — OXYCODONE-ACETAMINOPHEN 10 MG-325 MG TABLET
1.0000 | ORAL_TABLET | Freq: Three times a day (TID) | ORAL | Status: DC | PRN
Start: 2021-01-02 — End: 2021-01-08
  Administered 2021-01-02 – 2021-01-07 (×11): 1 via ORAL
  Filled 2021-01-02 (×12): qty 1

## 2021-01-02 MED ORDER — CHOLECALCIFEROL (VITAMIN D3) 25 MCG (1,000 UNIT) TABLET
1000.0000 [IU] | ORAL_TABLET | Freq: Every day | ORAL | Status: DC
Start: 2021-01-02 — End: 2021-01-08
  Administered 2021-01-02: 16:00:00 0 [IU] via ORAL
  Administered 2021-01-03 – 2021-01-07 (×5): 1000 [IU] via ORAL
  Filled 2021-01-02 (×5): qty 1

## 2021-01-02 MED ORDER — SODIUM CHLORIDE 0.9 % (FLUSH) INJECTION SYRINGE
3.0000 mL | INJECTION | Freq: Three times a day (TID) | INTRAMUSCULAR | Status: DC
Start: 2021-01-02 — End: 2021-01-05
  Administered 2021-01-02: 14:00:00 0 mL
  Administered 2021-01-02: 21:00:00 3 mL
  Administered 2021-01-03 – 2021-01-05 (×8): 0 mL

## 2021-01-02 MED ORDER — ACETAMINOPHEN 500 MG TABLET
500.0000 mg | ORAL_TABLET | ORAL | Status: DC | PRN
Start: 2021-01-02 — End: 2021-01-08

## 2021-01-02 MED ORDER — TRAMADOL 50 MG TABLET
50.0000 mg | ORAL_TABLET | Freq: Four times a day (QID) | ORAL | Status: DC | PRN
Start: 2021-01-02 — End: 2021-01-08
  Administered 2021-01-02 – 2021-01-07 (×6): 50 mg via ORAL
  Filled 2021-01-02 (×6): qty 1

## 2021-01-02 MED ORDER — IOPAMIDOL 370 MG IODINE/ML (76 %) INTRAVENOUS SOLUTION
100.0000 mL | INTRAVENOUS | Status: AC
Start: 2021-01-02 — End: 2021-01-02
  Administered 2021-01-02: 11:00:00 100 mL via INTRAVENOUS

## 2021-01-02 MED ORDER — SODIUM CHLORIDE 0.9 % (FLUSH) INJECTION SYRINGE
3.0000 mL | INJECTION | Freq: Three times a day (TID) | INTRAMUSCULAR | Status: DC
Start: 2021-01-02 — End: 2021-01-08
  Administered 2021-01-02 – 2021-01-06 (×12): 0 mL
  Administered 2021-01-06: 21:00:00 3 mL
  Administered 2021-01-06 – 2021-01-07 (×2): 0 mL
  Administered 2021-01-07: 06:00:00 3 mL

## 2021-01-02 MED ORDER — ELVITEG 150 MG-COB 150 MG-EMTRICIT 200 MG-TENOFO ALAFENAM 10 MG TABLET
1.0000 | ORAL_TABLET | Freq: Every day | ORAL | Status: DC
Start: 2021-01-02 — End: 2021-01-08
  Administered 2021-01-02: 16:00:00 0 via ORAL
  Administered 2021-01-03 – 2021-01-07 (×5): 1 via ORAL
  Filled 2021-01-02 (×5): qty 1

## 2021-01-02 MED ORDER — VANCOMYCIN IV - PHARMACIST TO DOSE PER PROTOCOL
Freq: Every day | Status: DC | PRN
Start: 2021-01-02 — End: 2021-01-08

## 2021-01-02 MED ORDER — DOCUSATE SODIUM 100 MG CAPSULE
100.0000 mg | ORAL_CAPSULE | Freq: Two times a day (BID) | ORAL | Status: DC | PRN
Start: 2021-01-02 — End: 2021-01-08

## 2021-01-02 MED ORDER — ATORVASTATIN 40 MG TABLET
80.0000 mg | ORAL_TABLET | Freq: Every evening | ORAL | Status: DC
Start: 2021-01-02 — End: 2021-01-08
  Administered 2021-01-02 – 2021-01-06 (×5): 80 mg via ORAL
  Administered 2021-01-07: 21:00:00 0 mg via ORAL
  Filled 2021-01-02 (×5): qty 2

## 2021-01-02 MED ORDER — GABAPENTIN 300 MG CAPSULE
300.0000 mg | ORAL_CAPSULE | Freq: Three times a day (TID) | ORAL | Status: DC
Start: 2021-01-02 — End: 2021-01-08
  Administered 2021-01-02 – 2021-01-07 (×16): 300 mg via ORAL
  Filled 2021-01-02 (×16): qty 1

## 2021-01-02 MED ORDER — CEFEPIME 2 GRAM/100 ML IN DEXTROSE (ISO-OSMOTIC) INTRAVENOUS PIGGYBACK
2.0000 g | INJECTION | Freq: Two times a day (BID) | INTRAVENOUS | Status: DC
  Administered 2021-01-02: 19:00:00 0 g via INTRAVENOUS
  Administered 2021-01-02: 18:00:00 2 g via INTRAVENOUS
  Administered 2021-01-03: 06:00:00 0 g via INTRAVENOUS
  Administered 2021-01-03 (×2): 2 g via INTRAVENOUS
  Administered 2021-01-03: 17:00:00 0 g via INTRAVENOUS
  Filled 2021-01-02 (×4): qty 100

## 2021-01-02 MED ORDER — LACTATED RINGERS INTRAVENOUS SOLUTION
INTRAVENOUS | Status: DC
Start: 2021-01-02 — End: 2021-01-06

## 2021-01-02 MED ORDER — VANCOMYCIN 1 GRAM/200 ML IN DEXTROSE 5 % INTRAVENOUS PIGGYBACK
12.0000 mg/kg | INJECTION | Freq: Once | INTRAVENOUS | Status: AC
Start: 2021-01-02 — End: 2021-01-02
  Administered 2021-01-02: 13:00:00 1000 mg via INTRAVENOUS
  Administered 2021-01-02: 15:00:00 0 mg via INTRAVENOUS
  Filled 2021-01-02: qty 200

## 2021-01-02 MED ORDER — IPRATROPIUM 0.5 MG-ALBUTEROL 3 MG (2.5 MG BASE)/3 ML NEBULIZATION SOLN
3.0000 mL | INHALATION_SOLUTION | RESPIRATORY_TRACT | Status: DC | PRN
Start: 2021-01-02 — End: 2021-01-08

## 2021-01-02 MED ORDER — SODIUM CHLORIDE 0.9 % (FLUSH) INJECTION SYRINGE
3.0000 mL | INJECTION | INTRAMUSCULAR | Status: DC | PRN
Start: 2021-01-02 — End: 2021-01-08

## 2021-01-02 MED ORDER — MAGNESIUM HYDROXIDE 400 MG/5 ML ORAL SUSPENSION
15.0000 mL | Freq: Every day | ORAL | Status: DC | PRN
Start: 2021-01-02 — End: 2021-01-08

## 2021-01-02 MED ORDER — LEVOTHYROXINE 88 MCG TABLET
88.0000 ug | ORAL_TABLET | Freq: Every morning | ORAL | Status: DC
Start: 2021-01-02 — End: 2021-01-08
  Administered 2021-01-02: 16:00:00 0 ug via ORAL
  Administered 2021-01-03 – 2021-01-07 (×5): 88 ug via ORAL
  Filled 2021-01-02 (×5): qty 1

## 2021-01-02 MED ORDER — SODIUM CHLORIDE 0.9 % INTRAVENOUS SOLUTION
INTRAVENOUS | Status: DC
Start: 2021-01-02 — End: 2021-01-02

## 2021-01-02 MED ORDER — ENOXAPARIN 40 MG/0.4 ML SUBCUTANEOUS SYRINGE
40.0000 mg | INJECTION | SUBCUTANEOUS | Status: DC
Start: 2021-01-02 — End: 2021-01-08
  Administered 2021-01-02: 16:00:00 0 mg via SUBCUTANEOUS
  Administered 2021-01-03 – 2021-01-06 (×4): 40 mg via SUBCUTANEOUS
  Administered 2021-01-07: 16:00:00 0 mg via SUBCUTANEOUS
  Filled 2021-01-02 (×4): qty 0.4

## 2021-01-02 MED ORDER — PANTOPRAZOLE 40 MG TABLET,DELAYED RELEASE
40.0000 mg | DELAYED_RELEASE_TABLET | Freq: Every day | ORAL | Status: DC
Start: 2021-01-02 — End: 2021-01-08
  Administered 2021-01-02: 16:00:00 0 mg via ORAL
  Administered 2021-01-03 – 2021-01-07 (×5): 40 mg via ORAL
  Filled 2021-01-02 (×5): qty 1

## 2021-01-02 MED ORDER — SODIUM CHLORIDE 0.9 % IV BOLUS
1000.0000 mL | INJECTION | Status: AC
Start: 2021-01-02 — End: 2021-01-02
  Administered 2021-01-02: 13:00:00 0 mL via INTRAVENOUS
  Administered 2021-01-02: 13:00:00 1000 mL via INTRAVENOUS

## 2021-01-02 NOTE — Nurses Notes (Signed)
Pt sleeping on recheck for pain medication effectiveness.

## 2021-01-02 NOTE — ED Nurses Note (Signed)
Pt taken to CT by RN.

## 2021-01-02 NOTE — ED Triage Notes (Addendum)
Pt reports, "I had a genital ward next to my penis. I had a little nodule that popped 2 nights ago and now it about the size of my fist". Pt reports pain and nausea. Pt reports history of HIV. Pt also reports rash on chest and pimple like bumps on neck.

## 2021-01-02 NOTE — H&P (Addendum)
[facility]  H&P    [name] 75 y.o. male B12/B12   Date of Service: 01/02/2021    Date of Admission:  01/02/2021   PCP: [place] Code Status:Full Code       Chief Complaint:  Skin lesion   HPI: [name] is a 75 y.o., White male with PMH significant for HIV, HTN, HLD, prostate cancer, hernia repair, known pancreatic mass, hypothyroidism. Patient presented to ER with concerns of a left groin skin lesion. He reported he had a wart beside penis on left side frozen off by dermatology about 1 week ago. States 2 days ago he felt a "popping" sensation in left groin area and now area has become more swollen and painful. States it did drain some fluid 2 days ago. Denies CP, SOB, cough, fever, chills, abd pain. Patient with known pancreatic mass and is scheduled for EUS with biopsy in [facility] Monday 01-04-21.  States mass has been known since 2001 and he has had 2 previous EUS and had previously follows with the [place]. He reports a nonpruitic rash on chest and back - states appeared 2 days ago after receiving his moderna vaccine booster.   In ER he had CT abd/pelvis that showed soft tissue stranding left superficial inguinal region related to edema/cellulitis. Given Vanc and Zosyn. CT scan also noted enlarging pancreatic mass with findings concerning for direct invasion into the splenic vein  ER physician Dr Melbourne Abts spoke with vascular surgery - he stated " vascular surgery here was comfortable with keeping the patient but recommended that discussed the case with gastroenterology."  He then spoke with GI here who said that the patient needs an EUS they would need to go to [facility]. He then  spoke with the advanced gastroenterologist in [place] who stated that the patient has an appointment Monday for an EUS with biopsy outpatient.  He did not think patient to be transferred from a pancreatic mass/splenic pathology delineated above.  He felt the patient be treated for cellulitis  and have his appointment on Monday a on outpatient basis.         ROS:   A 10 point organ ROS obtained and negative except for pertinent positives noted in the HPI.    PMHx:    Past Medical History:   Diagnosis Date    Cancer (CMS HCC)     prostate    Deep vein thrombosis (DVT) (CMS HCC)     right leg    Esophageal reflux     H/O hearing loss     High cholesterol     History of kidney disease     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    HTN (hypertension)     Hyperlipidemia     "borderline"    Hypothyroidism         PSHx:   Past Surgical History:   Procedure Laterality Date    COLON SURGERY      HIP SURGERY Right 04/22/2020    Right hip arthrotomy 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 Darryl Nestle HERNIA REPAIR      WRIST SURGERY Right           Allergies:    No Known Allergies Social History  Social History     Tobacco Use    Smoking status: Never    Smokeless tobacco: Never   Vaping Use    Vaping Use: Never used   Substance  Use Topics    Alcohol use: Not Currently    Drug use: Never       Family History  Family Medical History:     Problem Relation (Age of Onset)    Cancer Mother, Father, Other    Heart Attack Mother    High Cholesterol Other    Stroke Other             Home Meds:      Prior to Admission medications    Medication Sig Start Date End Date Taking? Authorizing Provider   amlodipine besylate (AMLODIPINE ORAL) Take 5 mg by mouth Once a day    Provider, Historical   atorvastatin (LIPITOR) 80 mg Oral Tablet Take 1 Tablet (80 mg total) by mouth Every evening Take one half pill every morning with breakfast (40 mg)    Provider, Historical   cholecalciferol, vitamin D3, 25 mcg (1,000 unit) Oral Tablet Take 1,000 Units by mouth Once a day    Provider, Historical   docusate sodium (COLACE) 100 mg Oral Capsule Take 100 mg by mouth Twice daily    Provider, Historical   elviteg-cob-emtri-tenof ALAFEN (GENVOYA) 150-150-200-10 mg Oral Tablet 1 Tablet Once a day     Provider, Historical   gabapentin (NEURONTIN) 300 mg Oral Capsule Take 1 Tab by mouth Three times a day    Provider, Historical   HYDROcodone-acetaminophen (NORCO) 5-325 mg Oral Tablet Take 1 Tablet by mouth Every 4 hours as needed 04/28/20   Paulina Fusi, MD   levothyroxine (SYNTHROID) 88 mcg Oral Tablet Take 88 mcg by mouth Every morning    Provider, Historical   melatonin 10 mg Oral Capsule Take by mouth Every night    Provider, Historical   omega-3-DHA-EPA-fish oil 1,000 mg (120 mg-180 mg) Oral Capsule Take by mouth Once a day    Provider, Historical   omeprazole (PRILOSEC) 20 mg Oral Capsule, Delayed Release(E.C.) Take 20 mg by mouth Twice daily    Provider, Historical   tamsulosin (FLOMAX) 0.4 mg Oral Capsule Take 1 Capsule (0.4 mg total) by mouth Twice daily 02/27/19   Provider, Historical          Results for orders placed or performed during the hospital encounter of 01/02/21 (from the past 24 hour(s))   CBC/DIFF    Narrative    The following orders were created for panel order CBC/DIFF.  Procedure                               Abnormality         Status                     ---------                               -----------         ------                     CBC WITH HYQM[[id number]]                Abnormal            Final result                 Please view results for these tests on the individual orders.   COMPREHENSIVE METABOLIC PANEL, NON-FASTING   Result Value Ref Range  SODIUM 135 (L) 136 - 145 mmol/L    POTASSIUM 4.3 3.5 - 5.1 mmol/L    CHLORIDE 106 96 - 111 mmol/L    CO2 TOTAL 20 (L) 23 - 31 mmol/L    ANION GAP 9 4 - 13 mmol/L    BUN 31 (H) 8 - 25 mg/dL    CREATININE 1.17 0.75 - 1.35 mg/dL    BUN/CREA RATIO 26 (H) 6 - 22    ESTIMATED GFR 65 >=60 mL/min/BSA    ALBUMIN 3.1 (L) 3.4 - 4.8 g/dL     CALCIUM 8.5 (L) 8.8 - 10.2 mg/dL    GLUCOSE 129 (H) 65 - 125 mg/dL    ALKALINE PHOSPHATASE 76 45 - 115 U/L    ALT (SGPT) 32 10 - 55 U/L    AST (SGOT)  16 8 - 45 U/L    BILIRUBIN TOTAL 0.7 0.3 - 1.3 mg/dL     PROTEIN TOTAL 5.9 (L) 6.0 - 8.0 g/dL   URINALYSIS, MACROSCOPIC   Result Value Ref Range    COLOR Yellow Colorless, Straw, Yellow    APPEARANCE Hazy (A) Clear    SPECIFIC GRAVITY 1.018 >1.005 - <1.030    PH 5.0 (L) >5.0 - <8.0    PROTEIN Not Detected Not Detected mg/dL    GLUCOSE 1+ (A) Not Detected mg/dL    KETONES Not Detected Not Detected mg/dL    UROBILINOGEN Not Detected Not Detected mg/dL    BILIRUBIN Not Detected Not Detected mg/dL    BLOOD Not Detected Not Detected mg/dL    NITRITE Not Detected Not Detected    LEUKOCYTES Not Detected Not Detected WBCs/uL   CBC WITH DIFF   Result Value Ref Range    WBC 24.1 (H) 3.7 - 11.0 x103/uL    RBC 4.72 4.50 - 6.10 x106/uL    HGB 14.3 13.4 - 17.5 g/dL    HCT 42.6 38.9 - 52.0 %    MCV 90.3 78.0 - 100.0 fL    MCH 30.3 26.0 - 32.0 pg    MCHC 33.6 31.0 - 35.5 g/dL    RDW-CV 13.6 11.5 - 15.5 %    PLATELETS 159 150 - 400 x103/uL    MPV 10.3 8.7 - 12.5 fL    NEUTROPHIL % 86 %    LYMPHOCYTE % 6 %    MONOCYTE % 6 %    EOSINOPHIL % 0 %    BASOPHIL % 0 %    NEUTROPHIL # 21.01 (H) 1.50 - 7.70 x103/uL    LYMPHOCYTE # 1.37 1.00 - 4.80 x103/uL    MONOCYTE # 1.32 (H) 0.20 - 1.10 x103/uL    EOSINOPHIL # <0.10 <=0.50 x103/uL    BASOPHIL # <0.10 <=0.20 x103/uL    IMMATURE GRANULOCYTE % 2 (H) 0 - 1 %    IMMATURE GRANULOCYTE # 0.38 (H) <0.10 x103/uL   URINALYSIS, MICROSCOPIC   Result Value Ref Range    WBCS 0-5 0-5, None /hpf    RBCS 0-5 None, 0-5 /hpf    BACTERIA Trace (A) None /hpf    HYALINE CASTS 0-5 (A) None /lpf    SQUAMOUS EPITHELIAL Rare (A) None /hpf    MUCOUS Numerous (A) None /hpf   LACTIC ACID LEVEL W/ REFLEX FOR LEVEL >2.0   Result Value Ref Range    LACTIC ACID 2.4 (H) 0.5 - 2.2 mmol/L        Imaging:  Mri Abdomen (Liver,Pancreas,Mrcp) W/Wo Con    Result Date: 12/23/2020  MRI ABDOMEN (LIVER,PANCREAS,MRCP) W/WO  CON performed on 12/23/2020 1:05 PM INDICATION: 75 years old Male K15.89: Mass of pancreas TECHNIQUE: Multi plantar, multisequence/MRCP of the abdomen with and  without contrast is obtained. INTRAVENOUS CONTRAST: 8 ml  cc of Gadavist CREATININE/GFR: 57 on 12/10/2020 COMPARISON: CT abdomen pelvis with IV contrast 04/19/2020 FINDINGS: Liver: The liver is normal size, contour, and signal intensity. Focal area of signal dropout along the falciform ligament in the left hepatic lobe on out of phase imaging is consistent with focal fat infiltration. Otherwise no significant steatosis. Few scattered simple liver cyst are present, largest in the right hepatic lobe at the dome measures 10 x 8 mm and largest in the left hepatic lobe measures 13 x 13 mm. Gallbladder: Gallbladder is incompletely distended. No evidence of acute cholecystitis. Biliary System: There is no intrahepatic or extrahepatic biliary ductal dilatation. No filling defects identified in the biliary tree. Spleen: The spleen is normal in size, contour, and signal. Pancreas: Redemonstration of mass lesion in the distal body-tail of the pancreas which measures 6.0 x 5.4 x 5.4 cm in the transverse, AP, craniocaudal dimension. It measured 6.5 x 4.4 x 5.0 cm in the March 2022. There are multiloculated small cysts within the mass with patchy areas of postcontrast enhancement predominantly in the septations. Multiple small cysts are present in the pancreatic head, as seen on series #6; image #25 which measures 4 mm. Another cyst in the head of the pancreas measures 1.4 x 1.5 cm, seen on series #6; image #21. Just proximal to the mass there is separate cyst in the distal body which measures 5 mm, seen on series #6; image #22. No diffusion restriction within the mass lesion is present. There is no dilatation of the pancreatic duct. Overall, this lesions is similar to the previous CT from March 2022. Adrenals: The adrenal glands are grossly unremarkable. Kidney/Proximal Ureters: The kidneys have normal size with good corticomedullary differentiation, enhancing symmetrically. There is no hydronephrosis or solid renal mass.  Multiple bilateral renal cyst are again present. There is no suspicious postcontrast enhancement.  The visualized portions of the  ureters are of normal caliber. Stomach: The stomach is partially distended without visible wall thickening. Bowel: No suspicious bowel wall thickening is present. Scattered colonic diverticulosis. Vasculature: The aorta, celiac axis and superior mesenteric artery are patent.  The IVC, portal vein, splenic vein, superior mesenteric vein and hepatic veins are patent.  Lymph Nodes: No suspicious lymph nodes are seen throughout the abdomen. Peritoneal Cavity: No free fluid is seen within the abdomen. Soft Tissues: No abdominal hernias are identified. Postsurgical changes of prior ventral abdominal wall hernia repair are noted. Bones: No acute fracture of aggressive bone lesion. No abnormal marrow signal is identified. Lung Bases: The included lung bases show no pleural effusion.     1.Large multicystic mass in the distal pancreatic body-tail measuring up to 6 cm with imaging characteristic most compatible with serous cystadenoma, however given the size of the lesion and side branch IPMN remaining in the differential, recommend gastroenterology consultation for evaluation for EUS/FNA. No suspicious peripancreatic lymph nodes. 2.Multiple cystic lesions in the pancreatic head and uncinate process as well as in the distal body, separate from the above described mass, could be related to side branch IPMNs. No worrisome imaging features identified within these lesions. No associated duct dilation. 3.Few simple liver cysts, largest in the left hepatic lobe measures 1.3 cm, similar to recent CT. 4.Multiple bilateral renal cysts, similar to recent CT.    CT ABDOMEN PELVIS W IV  CONTRAST    Result Date: 01/02/2021  Male, 75 years old. CT ABDOMEN PELVIS W IV CONTRAST performed on 01/02/2021 11:19 AM. REASON FOR EXAM:  llq abd pain RADIATION DOSE: 1271 DLP CONTRAST: 100 ml's of Isovue 370 TECHNIQUE:  Intravenous contrast utilized for study. Volumetric acquisition of the abdomen and pelvis. This CT scanner is equipped with dose reducing technology. The exposure is automatically adjusted according to patient body size in order to deliver the lowest dose possible. COMPARISON: CT abdomen pelvis of 04/19/2020. FINDINGS: Included lung bases are without acute abnormality. Multiple low-density hepatic lesions, likely representing hepatic cysts. Again noted large heterogeneously enhancing pancreatic tail mass now measuring up to 7.4 cm in diameter. This mass abuts the anterior aspect of the splenic vein with findings highly suggestive of intraluminal involvement. This mass also abuts the anterior aspect of the splenic artery. Multiple additional low-density pancreatic lesions. Spleen and adrenal glands are unremarkable. Kidneys opacify symmetrically and homogeneously. Multiple low-density lesions, most of which are subcentimeter in diameter. These likely represent renal cysts although the subcentimeter lesions are too small to fully characterize. No hydronephrosis and the ureters of normal caliber. Bladder is adequately distended without evidence of asymmetric wall thickening. Brachytherapy seeds within the prostate. Stomach is decompressed. Nonvisualization of the appendix. Colonic diverticulosis. No evidence mechanical bowel obstruction. No intraperitoneal free air or free fluid. Abdominal aorta is of normal caliber with scattered calcified plaque. Portal and hepatic veins are patent. Nonspecific subcutaneous soft tissue stranding within left superficial inguinal region. Fat-containing left-sided inguinal hernia. No acute fracture or traumatic malalignment.     1.Nonspecific subcutaneous soft tissue stranding involving the left superficial inguinal region. Findings may relate to nonspecific edema/cellulitis. 2.Increased size of a large heterogeneously enhancing pancreatic tail mass with findings highly concerning for  direct invasion into the splenic vein. 3.Colonic diverticulosis. Radiologist location ID: [id number]         Inpatient Medications:  acetaminophen (TYLENOL) tablet, 500 mg, Oral, Q4H PRN  amLODIPine (NORVASC) tablet, 5 mg, Oral, Daily  atorvastatin (LIPITOR) tablet, 80 mg, Oral, QPM  ceFEPime (MAXIPIME) 2 g in iso-osmotic 100 mL premix IVPB, 2 g, Intravenous, Q12H  cholecalciferol (VITAMIN D3) 1000 unit (25 mcg) tablet, 1,000 Units, Oral, Daily  docusate sodium (COLACE) capsule, 100 mg, Oral, 2x/day PRN  elvitegravir-cobicistat-emtricitrabine-tenofovir (GENVOYA) 150-150-200-10 mg per tablet, 1 Tablet, Oral, Daily  enoxaparin PF (LOVENOX) 40 mg/0.4 mL SubQ injection, 40 mg, Subcutaneous, Q24H  gabapentin (NEURONTIN) capsule, 300 mg, Oral, 3x/day  ipratropium-albuterol 0.5 mg-3 mg(2.5 mg base)/3 mL Solution for Nebulization, 3 mL, Nebulization, Q4H PRN  levothyroxine (SYNTHROID) tablet, 88 mcg, Oral, QAM  LR premix infusion, , Intravenous, Continuous  magnesium hydroxide (MILK OF MAGNESIA) 400mg  per 37mL oral liquid, 15 mL, Oral, Daily PRN  melatonin tablet, 10.5 mg, Oral, HS PRN  NS flush syringe, 3 mL, Intracatheter, Q8HRS  NS flush syringe, 3 mL, Intracatheter, Q1H PRN  NS flush syringe, 3 mL, Intracatheter, Q8HRS  NS flush syringe, 3 mL, Intracatheter, Q1H PRN  ondansetron (ZOFRAN) 2 mg/mL injection, 4 mg, Intravenous, Q6H PRN  pantoprazole (PROTONIX) delayed release tablet, 40 mg, Oral, Daily  tamsulosin (FLOMAX) capsule, 0.4 mg, Oral, 2x/day  Vancomycin IV - Pharmacist to Dose per Protocol, , Does not apply, Daily PRN  Vancomycin IV - Pharmacist to Dose per Protocol, , Does not apply, Daily PRN       ______________________________________________________________________    PHYSICAL EXAM:  VITALS: BP (!) 113/91    Pulse 69    Temp 36.8 C (98.2 F)  Resp (!) 23    Ht 1.803 m (5' 10.98")    Wt 76.7 kg (169 lb)    SpO2 95%    BMI 23.58 kg/m         GENERAL:   Pt is a pleasant,75 y.o. male who is resting  comfortably in bed in NAD. Appears stated age  HENT:  head normocephalic, symmetrical facies. No rhinorrhea.  Eyes: EOM intact. PERRLA. Sclera non-icteric, non-injected.  NECK: supple. No masses  CV: RRR. No LE edema  LUNGS: CTAB, No rhonchi, rales, wheezes.   GI: (+) BS in all 4 quadrants. soft, NT/ND.  GU: left inguinal/groin area with erythema/edema, no abscess/lesion identified. Painful to palpation.   MSK: full AROM. No joint effusions/ swelling/ deformities. Gait not assessed at this time.  SKIN: Nonpruitic rash to trunk/chest/back  NEURO: CN grossly intact. Sensation intact b/l. No focal deficits.  PSYCH: A&Ox4. Mood, behavior and affect normal w/ Intact judgement & insight       Assessment/Plan:    Sepsis 2/2 below  Left groin cellulitis  Recent left groin wart treated with cryotherapy   Lactic acidosis   Presented to ER with c/o worsening left groin pain/erythema/edema where he had cryotherapy of a wart about 1 week ago.    CT abd/pelvis showed soft tissue stranding left superficial inguinal region related to edema/cellulitis   Will start Vanc/Cefepime   WBC count 24 with lactate of 2.4. Check CRP   S/p 2 L IVF bolus in ER. Repeat lactate pending.    [place] pending   SIRS + WBC 24, tachypnea,     Pancreatic mass (known)  Abnormal Abd/pelvis CT   Has known history of pancreatic mass (diagnosed 2001). Noted on CT scan to be enlarging with findings concerning for direct invasion into the splenic vein   ER spoke with GI and vascular on call.    Patient is scheduled for EUS with biopsy in [name] Monday and GI rec patient keep this appt.    Vascular agreeable to see on consult.    Essential HTN   Cont Norvasc     HLD   Cont statin    Hypothyroidism   Cont synthroid    GERD   Cont PPI    BPH   Cont flomax    HIV   On Alafen    Other PMH: Prostate cancer, DVT RLE, CKD II        DVT prophylaxis: Lovenox    All critical aspects of this history and physical and assessment/plan was discussed in detail  with Dr. Laurena Bering, and he does agree with the stated above.   Meta Hatchet, APRN,NP-C    Patient was seen and examined as part of a shared visit with the APP.  I personally spent more than 50% of the encounter with direct, face-to-face patient care including: obtaining history, performing physical exam, counseling the patient and family, and directing medical decision making.    Patient admitted for Levaquin cellulitis after recent crawling treated with cryotherapy by dermatology last week.  Started on IV antibiotics for cellulitis.  Elevated lactate 2.4 received IV fluid bolus.  Hemodynamically stable.  Also planning to have an EUS done in [place] on Monday with GI.  This is for known pancreatic mass that he has had followed for the last 20 years.  Can likely discharge in next 24 hours transition to p.o. antibiotics pending workup in response to IV fluids and IV antibiotics.    My substantial findings are below:  Constitutional:  Alert and Oriented x4 in No acute distress   HEENT: normocephalic atraumatic extraocular movements intact  Cardiovascular: S1 S2 no obvious murmur  Respiratory: Clear to auscultation bilaterally,  no wheezes, rales, rhonchi  GI/GU: soft nontender nondistended bowel sounds present, normal pitch, normal active  Musculoskeletal: no clubbing cyanosis or edema  Skin: raised rash left groin, erythematous, TTP   Neuro: alert orient x4 no focal deficits  Psych: cooperative appropriate mood affect    Domenic Polite, DO

## 2021-01-02 NOTE — Care Plan (Signed)
Pt alert and oriented x 4. Noted to be hard of hearing. Telemetry pack in place. Pt voices pain level of 7 over left groin area. Medicated with tramadol PO.  Pt declined boxed meal. Voided 400 cc of amber urine. Call box at hand.   Problem: Adult Inpatient Plan of Care  Goal: Plan of Care Review  Outcome: Ongoing (see interventions/notes)  Flowsheets (Taken 01/02/2021 1800)  Progress: no change  Plan of Care Reviewed With: patient     Problem: Adult Inpatient Plan of Care  Goal: Patient-Specific Goal (Individualized)  Outcome: Ongoing (see interventions/notes)  Flowsheets (Taken 01/02/2021 1658)  Individualized Care Needs: antibiotics  Anxieties, Fears or Concerns: denies

## 2021-01-02 NOTE — Nurses Notes (Signed)
Pt sitting up in bed. Telemetry pack applied. Voices pain level of 5 over left groin. Area erythematous and edematous, tender and firm to touch. Scattered raised reddened rash noted across upper chest, a few spots on abdomen and across entire back.  Denies any difficulty urinating. Call box at hand. Wife at bedside. MD notified of need for analgesic.

## 2021-01-02 NOTE — ED Provider Notes (Signed)
Emergency Department  Provider Note  HPI - 01/02/2021    COVID-19 PANDEMIC IN EFFECT    Recent [id number] history, if applicable:  Lab Results   Component Value Date    COVID19PCR Not Detected 04/19/2020    COVID19PCR Not Detected 03/24/2020     History and Physical Exam     [name] 75 y.o. male  Date of Birth: 06-25-1945     Attending: Dr. Jimmie Molly    PCP: [place] [facility]      HPI:    Visit Reason:   Chief Complaint   Patient presents with   . Skin  Lesion     On penis        Entered the patient's room for initial exam at 0909 hours.    Arrival: The patient arrived by ambulance and is alone.  History Limitations: none    [name] is a 75 y.o. male  who presents to the ED today for skin lesion.    Patient arrives to the ED today via private vehicle with a PMHx of hypothyroidism, HTN, high cholesterol, DVT, HLD, CA, colon surgery, and hernia repair. Patient states that he recently had a wart on beside his penis frozen off. He endorses that he felt a "pop" two days ago. Pt endorses the area has progressively enlarged and became more painful. Pt denies any GU symptoms. 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 Pain is stated to be progressive and rated at 6/10.    Patient has not had any recent fevers or known sick exposures. Patient denies having any head pain, neck pain, back pain, chest pain, cough, SOB, abdominal pain, n/v/d, or GU symptoms. Patient does not have any other associated s/s at this time.    There are no other complaints or concerns noted at this time.    Review of Systems:    Constitutional: (-) fever, chills  Skin: (+) Skin lesion, no rashes  HENT: (-) sore throat, ear pain, difficulty swallowing  Eyes: (-) vision changes, redness, discharge  Cardio: (-) chest pain, (-) palpitations   Respiratory: (-) cough, wheezing, SOB  GI:  (-) nausea, vomiting, diarrhea, constipation, abdominal pain  GU:  (-) dysuria, hematuria, polyuria  MSK: (-) joint pain, neck pain, back pain  Neuro: (-)  numbness, tingling, weakness, headache  Psych: (-) SI, HI, anxiety, depression.  All other systems reviewed and are negative, unless commented on in the HPI.     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 Smoking status: Never   .  Smokeless tobacco: Never   Vaping Use   . Vaping Use: Never used   Substance Use Topics   . Alcohol use: Not Currently   . Drug use: Never     Family History  Family Medical History:     Problem Relation (Age of Onset)    Cancer Mother, Father, Other    Heart Attack Mother    High Cholesterol Other    Stroke Other         Home Meds:     Current Facility-Administered Medications:   .  acetaminophen (TYLENOL) tablet, 500 mg, Oral, Q4H PRN, Stalnaker, Sarah, APRN,NP-C  .  amLODIPine (NORVASC) tablet, 5 mg, Oral, Daily, Stalnaker, Sarah, APRN,NP-C, 5 mg at 01/05/21 6378  .  atorvastatin (LIPITOR) tablet, 80 mg, Oral, QPM, Meta Hatchet, APRN,NP-C, 80 mg at 01/05/21 2012  .  cholecalciferol (VITAMIN D3) 1000 unit (25 mcg) tablet, 1,000 Units, Oral, Daily, Meta Hatchet, APRN,NP-C, 1,000 Units at 01/05/21 5885  .  docusate sodium (COLACE) capsule, 100 mg, Oral, 2x/day PRN, Stalnaker, Sarah, APRN,NP-C  .  elvitegravir-cobicistat-emtricitrabine-tenofovir (GENVOYA) 150-150-200-10 mg per tablet, 1 Tablet, Oral, Daily, Meta Hatchet, APRN,NP-C, 1 Tablet at  01/05/21 1603  .  enoxaparin PF (LOVENOX) 40 mg/0.4 mL SubQ injection, 40 mg, Subcutaneous, Q24H, Stalnaker, Sarah, APRN,NP-C, 40 mg at 01/05/21 1602  .  gabapentin (NEURONTIN) capsule, 300 mg, Oral, 3x/day, Meta Hatchet, APRN,NP-C, 300 mg at 01/05/21 2012  .  HYDROmorphone (DILAUDID) 1 mg/mL injection, 2 mg, Intravenous, Now, Drue Novel, MD  .  HYDROmorphone (DILAUDID) 1 mg/mL injection, 2 mg, Intravenous, Q4H PRN, Drue Novel, MD, 2 mg at 01/05/21 2012  .  ipratropium-albuterol 0.5 mg-3 mg(2.5 mg base)/3 mL Solution for Nebulization, 3 mL, Nebulization, Q4H PRN, Stalnaker, Sarah, APRN,NP-C  .  levothyroxine (SYNTHROID) tablet, 88 mcg, Oral, QAM, Meta Hatchet, APRN,NP-C, 88 mcg at 01/05/21 0439  .  LR premix infusion, , Intravenous, Continuous, Meta Hatchet, APRN,NP-C, Last Rate: 100 mL/hr at 01/05/21 2017, New Bag at 01/05/21 2017  .  magnesium hydroxide (MILK OF MAGNESIA) 400mg  per 24mL oral liquid, 15 mL, Oral, Daily PRN, Stalnaker, Sarah, APRN,NP-C  .  melatonin tablet, 10.5 mg, Oral, HS PRN, Stalnaker, Sarah, APRN,NP-C  .  NS flush syringe, 3 mL, Intracatheter, Q8HRS, Stalnaker, Sarah, APRN,NP-C  .  NS flush syringe, 3 mL, Intracatheter, Q1H PRN, Brett Albino, Sarah, APRN,NP-C  .  ondansetron (ZOFRAN) 2 mg/mL injection, 4 mg, Intravenous, Q6H PRN, Stalnaker, Sarah, APRN,NP-C  .  oxyCODONE-acetaminophen 10-325mg  per tablet, 1 Tablet, Oral, Q8H PRN, Aquilla Solian, MD, 1 Tablet at 01/05/21 1433  .  pantoprazole (PROTONIX) delayed release tablet, 40 mg, Oral, Daily, Meta Hatchet, APRN,NP-C, 40 mg at 01/05/21 0439  .  perflutren lipid microspheres (DEFINITY) 1.3 mL in NS 10 mL (tot vol) injection, 2 mL, Intravenous, Give in Cardiology, Greig Right, MD  .  tamsulosin [facility]) capsule, 0.4 mg, Oral, 2x/day, Meta Hatchet, APRN,NP-C, 0.4 mg at 01/05/21 2012  .  traMADol (ULTRAM) tablet, 50 mg, Oral, Q6H PRN, Domenic Polite, DO, 50 mg at 01/05/21 0277  .  vancomycin (VANCOCIN) 1,250 mg in NS  250 mL IVPB, 15 mg/kg (Adjusted), Intravenous, Q12H, Meta Hatchet, APRN,NP-C, Stopped at 01/05/21 1350  .  Vancomycin IV - Pharmacist to Dose per Protocol, , Does not apply, Daily PRN, Meta Hatchet, APRN,NP-C          Exam and Objective Findings     Nursing notes reviewed. Old records reviewed.    Filed Vitals:    01/05/21 1440  01/05/21 1533 01/05/21 2044 01/05/21 2112   BP: 129/81  (!) 140/82    Pulse: 97  83    Resp: 18 18 18 18    Temp: 36.7 C (98.1 F)  36.8 C (98.2 F)    SpO2: 97%  91%        Physical Exam  Nursing note and vitals reviewed.  Vital signs reviewed as above.     Constitutional: Pt is well-developed and well-nourished.   Head: Normocephalic and atraumatic.   Eyes/ Ears/ Throat: Conjunctivae are normal. Pupils are equal, round, and reactive to light. EOM are intact. TMs normal. No pharyngeal erythema.  Neck: Soft, supple, full range of motion.  Cardiovascular: RRR. No Murmurs/rubs/gallops. Distal pulses present and equal bilaterally.  Pulmonary/Chest: Normal BS BL with no distress. No audible wheezes or crackles are noted.  GI/ Abdominal: Soft, nontender, nondistended. No rebound, guarding, or masses.  Musculoskeletal: + Swelling to the L inguinal area. Difficult to reduce secondary to pain. 5/5 muscle strength noted in all extremities with intact sensation. Normal range of motion. No deformities.   Neurological: CNs 2-12 grossly intact. No focal deficits noted.  Skin: Capillary refill < 3 seconds. Warm and dry. No rash or lesions  Psychiatric: Patient has a normal mood and affect.     Work-up:  Orders Placed This Encounter   . ADULT ROUTINE BLOOD CULTURE, SET OF 2 ADULT BOTTLES (BACTERIA AND YEAST)   . ADULT ROUTINE BLOOD CULTURE, SET OF 2 ADULT BOTTLES (BACTERIA AND YEAST)   . WOUND, SUPERFICIAL/NON-STERILE SITE, AEROBIC CULTURE AND GRAM STAIN   . ANAEROBIC CULTURE   . ADULT ROUTINE BLOOD CULTURE, SET OF 2 ADULT BOTTLES (BACTERIA AND YEAST)   . ADULT ROUTINE BLOOD CULTURE, SET OF 2 ADULT  BOTTLES (BACTERIA AND YEAST)   . CT ABDOMEN PELVIS W IV CONTRAST   . CBC/DIFF   . COMPREHENSIVE METABOLIC PANEL, NON-FASTING   . SYPHILIS, RAPID PLASMIN REAGIN (RPR), WITH TITER REFLEX   . URINALYSIS, MACROSCOPIC   . CBC WITH DIFF   . URINALYSIS, MICROSCOPIC   . LACTIC ACID LEVEL W/ REFLEX FOR LEVEL >2.0   . CANCELED: LACTIC ACID TIMED   . CBC/DIFF   . COMPREHENSIVE METABOLIC PANEL, NON-FASTING   . MAGNESIUM   . C-REACTIVE PROTEIN(CRP),INFLAMMATION   . PROCALCITONIN   . LACTIC ACID TIMED   . VANCOMYCIN, TROUGH   . CBC WITH DIFF   . MANUAL DIFF AND MORPHOLOGY-SYSMEX   . CBC/DIFF   . MAGNESIUM   . COMPREHENSIVE METABOLIC PANEL, NON-FASTING   . LACTIC ACID LEVEL   . C-REACTIVE PROTEIN(CRP),INFLAMMATION   . PROCALCITONIN   . CBC WITH DIFF   . VANCOMYCIN, TROUGH   . CBC/DIFF   . BASIC METABOLIC PANEL   . MAGNESIUM   . CBC WITH DIFF   . CBC   . BASIC METABOLIC PANEL   . MAGNESIUM   . OXYGEN PROTOCOL   . TRANSTHORACIC ECHOCARDIOGRAM - ADULT   . CANCELED: INSERT & MAINTAIN PERIPHERAL IV ACCESS   . CANCELED: PERIPHERAL IV DRESSING CHANGE   . INSERT & MAINTAIN PERIPHERAL IV ACCESS   . PERIPHERAL IV DRESSING CHANGE   . CANCELED: PATIENT CLASS/LEVEL OF CARE DESIGNATION - CCMC   . CANCELED: PT CLASS/LEVEL OF CARE CLARIFICATION   . PT CLASS/LEVEL OF CARE CLARIFICATION   . morphine 4 mg/mL injection   . piperacillin-tazobactam (ZOSYN) 3.375 g in iso-osmotic 50 mL premix IVPB   . vancomycin (VANCOCIN) 1 g in D5W 200 mL premix IVPB   .  iopamidol (ISOVUE-370) 76% infusion   . NS bolus infusion 1,000 mL   . NS bolus infusion 1,000 mL   . morphine 4 mg/mL injection   . gabapentin (NEURONTIN) capsule   . atorvastatin (LIPITOR) tablet   . elvitegravir-cobicistat-emtricitrabine-tenofovir (GENVOYA) 150-150-200-10 mg per tablet   . tamsulosin (FLOMAX) capsule   . amLODIPine (NORVASC) tablet   . docusate sodium (COLACE) capsule   . melatonin tablet   . pantoprazole (PROTONIX) delayed release tablet   . levothyroxine (SYNTHROID) tablet   .  cholecalciferol (VITAMIN D3) 1000 unit (25 mcg) tablet   . magnesium hydroxide (MILK OF MAGNESIA) 400mg  per 22mL oral liquid   . ondansetron (ZOFRAN) 2 mg/mL injection   . NS flush syringe   . NS flush syringe   . Vancomycin IV - Pharmacist to Dose per Protocol   . enoxaparin PF (LOVENOX) 40 mg/0.4 mL SubQ injection   . ipratropium-albuterol 0.5 mg-3 mg(2.5 mg base)/3 mL Solution for Nebulization   . acetaminophen (TYLENOL) tablet   . LR premix infusion   . vancomycin (VANCOCIN) 1,250 mg in NS 250 mL IVPB   . traMADol (ULTRAM) tablet   . oxyCODONE-acetaminophen 10-325mg  per tablet   . lidocaine 1%-EPINEPHrine 1:100,000 injection   . HYDROmorphone (DILAUDID) 1 mg/mL injection   . morphine 4 mg/mL injection ---Cabinet Override   . HYDROmorphone (DILAUDID) 1 mg/mL injection   . perflutren lipid microspheres (DEFINITY) 1.3 mL in NS 10 mL (tot vol) injection   . vancomycin (VANCOCIN) 1,250 mg in NS 250 mL IVPB        Labs:  Results for orders placed or performed during the hospital encounter of 01/02/21 (from the past 24 hour(s))   CBC/DIFF    Narrative    The following orders were created for panel order CBC/DIFF.  Procedure                               Abnormality         Status                     ---------                               -----------         ------                     CBC WITH YYQM[[id number]]                Abnormal            Final result                 Please view results for these tests on the individual orders.   BASIC METABOLIC PANEL   Result Value Ref Range    SODIUM 136 136 - 145 mmol/L    POTASSIUM 4.2 3.5 - 5.1 mmol/L    CHLORIDE 103 96 - 111 mmol/L    CO2 TOTAL 24 23 - 31 mmol/L    ANION GAP 9 4 - 13 mmol/L    CALCIUM 8.5 (L) 8.8 - 10.2 mg/dL    GLUCOSE 203 (H) 65 - 125 mg/dL    BUN 18 8 - 25 mg/dL    CREATININE 0.94 0.75 - 1.35 mg/dL    BUN/CREA RATIO 19 6 - 22    ESTIMATED GFR 85 >=  60 mL/min/BSA   MAGNESIUM   Result Value Ref Range    MAGNESIUM 2.2 1.8 - 2.6 mg/dL   CBC WITH DIFF   Result  Value Ref Range    WBC 16.0 (H) 3.7 - 11.0 x10^3/uL    RBC 4.65 4.50 - 6.10 x10^6/uL    HGB 14.3 13.4 - 17.5 g/dL    HCT 43.2 38.9 - 52.0 %    MCV 92.9 78.0 - 100.0 fL    MCH 30.8 26.0 - 32.0 pg    MCHC 33.1 31.0 - 35.5 g/dL    RDW-CV 13.6 11.5 - 15.5 %    PLATELETS 150 150 - 400 x10^3/uL    MPV 10.7 8.7 - 12.5 fL    NEUTROPHIL % 88 %    LYMPHOCYTE % 7 %    MONOCYTE % 4 %    EOSINOPHIL % 0 %    BASOPHIL % 0 %    NEUTROPHIL # 13.96 (H) 1.50 - 7.70 x10^3/uL    LYMPHOCYTE # 1.14 1.00 - 4.80 x10^3/uL    MONOCYTE # 0.71 0.20 - 1.10 x10^3/uL    EOSINOPHIL # <0.10 <=0.50 x10^3/uL    BASOPHIL # <0.10 <=0.20 x10^3/uL    IMMATURE GRANULOCYTE % 1 0 - 1 %    IMMATURE GRANULOCYTE # 0.18 (H) <0.10 x10^3/uL       Imaging:        Abnormal Lab results:  Labs Reviewed   COMPREHENSIVE METABOLIC PANEL, NON-FASTING - Abnormal; Notable for the following components:       Result Value    SODIUM 135 (*)     CO2 TOTAL 20 (*)     BUN 31 (*)     BUN/CREA RATIO 26 (*)     ALBUMIN 3.1 (*)     CALCIUM 8.5 (*)     GLUCOSE 129 (*)     PROTEIN TOTAL 5.9 (*)     All other components within normal limits   URINALYSIS, MACROSCOPIC - Abnormal; Notable for the following components:    APPEARANCE Hazy (*)     PH 5.0 (*)     GLUCOSE 1+ (*)     All other components within normal limits   CBC WITH DIFF - Abnormal; Notable for the following components:    WBC 24.1 (*)     NEUTROPHIL # 21.01 (*)     MONOCYTE # 1.32 (*)     IMMATURE GRANULOCYTE % 2 (*)     IMMATURE GRANULOCYTE # 0.38 (*)     All other components within normal limits   URINALYSIS, MICROSCOPIC - Abnormal; Notable for the following components:    BACTERIA Trace (*)     HYALINE CASTS 0-5 (*)     SQUAMOUS EPITHELIAL Rare (*)     MUCOUS Numerous (*)     All other components within normal limits   LACTIC ACID LEVEL W/ REFLEX FOR LEVEL >2.0 - Abnormal; Notable for the following components:    LACTIC ACID 2.4 (*)     All other components within normal limits   CBC/DIFF    Narrative:     The following  orders were created for panel order CBC/DIFF.  Procedure                               Abnormality         Status                     ---------                               -----------         ------  CBC WITH ELTR[[id number]]                Abnormal            Final result                 Please view results for these tests on the individual orders.       Assessment & Plan     Plan: Appropriate labs and imaging ordered. Medical Records reviewed.    MDM:   During the patient's stay in the emergency department, the above listed information was used to assist with medical decision making and were reviewed by myself when available for review.  ED Course    Pt remained stable throughout the emergency department course.      Consults:    See ED course for further details on consults   Spoke with Dr. Laurena Bering at 1410. States to admit patient under himself for further medical evaluation and care.    Disposition   Impression:   Diagnoses     Diagnosis Comment Added By Time Added    Cellulitis, unspecified cellulitis site  Jimmie Molly, DO 01/02/2021 12:51 PM    Pancreatic mass  Jimmie Molly, DO 01/02/2021 12:51 PM    Staphylococcus aureus bacteremia  Greig Right, MD 01/04/2021 11:19 AM        Disposition:  Admitted     Patient will be admitted to hospitalist service for further evaluation and management.     1410: Care of patient will be admitted to Dr. Laurena Bering at this time.    The patient verbalized understanding of all instructions and had no further questions or concerns.       I am scribing for, and in the presence of, Dr. Jimmie Molly for services provided on 01/02/2021.  Floy Sabina, SCRIBE     [place], [place]  01/02/2021, 08:54    I personally performed the services described in this documentation, as scribed in my presence, and it is both accurate and complete   Jimmie Molly, DO   Emergency Medicine       This note was partially generated using MModal Fluency Direct system, and there may  be some incorrect words, spellings, and punctuation that were not noted in checking the note before saving.

## 2021-01-02 NOTE — Consults (Signed)
PATIENT NAME: [name] NUMBER:  [id number]  DATE OF SERVICE: 01/02/2021  DATE OF BIRTH:  02-16-1945    CONSULTATION    VASCULAR SURGERY CONSULTATION    CONSULTING PHYSICIAN:  Cleopatra Cedar, MD.    CONSULTING SERVICE:  Vascular surgery.    REQUESTING PHYSICIANS:  Dan Europe, DO, [facility], and Domenic Polite, MD, admitting hospitalist.    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 The patient is scheduled for fine-needle aspiration biopsy by Surgical Oncology this coming week.     The patient also has a history of right calf DVT earlier this year following right hip surgery treated with 3 months of systemic anticoagulation and was recently discontinued.  The patient underwent treatment of a wart near his left groin and perineum as an outpatient, which has become more reddened and swollen, causing the patient to present to the Emergency Department today.  While in the Emergency Department, he underwent CT scan of the abdomen and pelvis with IV contrast today on January 02, 2021, for left lower quadrant abdominal pain which showed subcutaneous soft tissue stranding of the left superficial inguinal region consistent with cellulitis with increased size of enhancing pancreatic tail mass with concern for invasion of the splenic vein with the mass abutting the anterior aspect of the splenic artery with no evidence of bleeding or thrombosis.      Vascular surgery consultation is requested to evaluate his findings and recommendations.     The patient denies previous history of DVT or history of pancreatic cancer, and admits that he is no longer on anticoagulation under the direction of his hematologist.  He denies chest pain, shortness of breath, nausea, vomiting.  No fever, cough, throat pain, or COVID-19 symptoms.  He does have pain and tenderness in the area of his wart treatment.    ALLERGIES:  None.    MEDICATIONS:  Listed on the electronic record.  These reviewed by me. These include:  1. Norvasc 5 mg orally daily.  2. Lipitor 80 mg orally daily.  3. Maxipime 2 grams IV piggyback every 12 hours.    4. Cholecalciferol 25 mcg orally daily.  5. Genvoya tablet orally daily.  6. Lovenox 40 mg subcutaneous every 24 hours.  7. Neurontin 300 mg orally 3 times a day.  8. Synthroid 88 mcg orally daily.  9. Protonix 40 mg orally daily.  10. Flomax 0.4 mg orally twice a day.  11. Vancomycin 1250 mg IV piggyback every 18 hours with pharmacokinetics.    PAST MEDICAL HISTORY:  Illnesses and surgery are listed above in the HPI and on the patient's electronic record and these are reviewed by me.  In addition, the patient has a history of hypothyroidism with history of renal insufficiency in the past from HIV medication.  He has long-term hearing loss and does use hearing aids.    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    SOCIAL HISTORY:  The patient denies history of smoking use, alcohol or drug abuse.    REVIEW OF SYSTEMS:  A 12-point review of system was conducted.  Pertinent positives and negatives are noted above in the HPI.  All other systems are reviewed and are negative.    PHYSICAL EXAMINATION:  Awake, alert, and oriented x3.  Very pleasant, nice gentleman.  Lungs:  Diminished  breath sounds.  No rhonchi, no rales.  Heart:  Regular rate and rhythm.  Abdomen:  Soft, moderately obese, nontender, nondistended.  No masses.  No peritonitis.  Lower extremities:  Show palpable dorsalis pedis and posterior tibial pulses in both feet with good range of motion bilaterally.  No evidence of distal emboli, cyanosis, or ischemia. No evidence of phlegmasia, very mild edema.    IMAGING STUDIES:  CT abdomen and pelvis with IV contrast done today at [facility] on January 02, 2021, with results noted above in the HPI.    IMPRESSION AND PLAN:  Known multiple pancreatic mass and cystic lesions with plans for fine-needle aspiration biopsy at [facility] this coming week.  I recommend evaluation during this hospital stay by his known hematologist, Dr. Milinda Cave, who last saw him in the office on December 11, 2020, for his evaluation of any possible changes and consideration for possible systemic anticoagulation given the possible risk for progression to DVT of the splenic vein.  I do not see any current evidence of thrombosis or hemorrhage and without a definitive biopsy or diagnosis it is difficult to ascertain his risk.  In any event, there is no indication for vascular surgery intervention at this time and I would defer anticoagulation recommendations to his hematologist oncologist.     History of right calf DVT earlier this year following right hip surgery with appropriate treatment as noted above in the HPI.     Left groin cellulitis following perineal wart treatment as an outpatient.  I will defer recommendations to infectious disease and dermatology.  I will continue to follow him with you.        Cleopatra Cedar, MD              CC:   Domenic Polite, MD     Dan Europe, [place]   [place], [place] [id number]       DD:  01/02/2021 20:13:33  DT:  01/02/2021 21:29:44 DG  D#:  [id number]

## 2021-01-02 NOTE — Nurses Notes (Signed)
Notified pt and wife that [name] would need brought in. Both verbalized understanding.

## 2021-01-02 NOTE — Pharmacy (Signed)
Pharmacy Department               Therapeutic Drug Monitoring: Vancomycin Trough-based Dosing               01/02/2021    Patient Name: [name]   Date of Birth: 1945/10/20   MRN: [id number]   CSN: [id number]    Vancomycin Indication: Cellulitis  Day # 1 of Therapy  Provider Ordering Vancomycin Pharmacist Management: Stalnaker  Goal Trough: 10-15 mcg/mL    Current Vancomycin Regimen: 1250 mg IVPB every 18 hours.    Measured Vancomycin Serum Trough Level: trough 12-5 at [address] [facility]  01/02/2021  17:00    The Medical Executive Committee at [facility] has granted pharmacists via protocol order the ability to manage vancomycin therapy in pediatric and adult patients in accordance with the Vancomycin Dosing Protocol.  Pharmacist responsibilities will include ordering and changing doses, ordering serum concentration levels, and other relevant labs.  The decision to discontinue vancomycin therapy altogether will be determined by the primary service.  *Creatinine clearance is estimated by using the Cockcroft-Gault equation for adult patients.

## 2021-01-03 DIAGNOSIS — A4189 Other specified sepsis: Secondary | ICD-10-CM

## 2021-01-03 DIAGNOSIS — L02214 Cutaneous abscess of groin: Secondary | ICD-10-CM | POA: Diagnosis present

## 2021-01-03 DIAGNOSIS — R7881 Bacteremia: Secondary | ICD-10-CM | POA: Diagnosis present

## 2021-01-03 DIAGNOSIS — B9689 Other specified bacterial agents as the cause of diseases classified elsewhere: Secondary | ICD-10-CM

## 2021-01-03 LAB — CBC WITH DIFF
HCT: 44.3 % (ref 38.9–52.0)
HGB: 14.6 g/dL (ref 13.4–17.5)
MCH: 30.5 pg (ref 26.0–32.0)
MCHC: 33 g/dL (ref 31.0–35.5)
MCV: 92.7 fL (ref 78.0–100.0)
MPV: 10.6 fL (ref 8.7–12.5)
PLATELETS: 147 10*3/uL — ABNORMAL LOW (ref 150–400)
RBC: 4.78 10*6/uL (ref 4.50–6.10)
RDW-CV: 13.7 % (ref 11.5–15.5)
WBC: 33 10*3/uL — ABNORMAL HIGH (ref 3.7–11.0)

## 2021-01-03 LAB — MANUAL DIFF AND MORPHOLOGY-SYSMEX
BASOPHIL #: <0.1 10*3/uL (ref <=0.20)
BASOPHIL %: 0 %
EOSINOPHIL #: <0.1 10*3/uL (ref <=0.50)
EOSINOPHIL %: 0 %
LYMPHOCYTE #: 0.66 10*3/uL — ABNORMAL LOW (ref 1.00–4.80)
LYMPHOCYTE %: 2 %
MONOCYTE #: 0.66 10*3/uL (ref 0.20–1.10)
MONOCYTE %: 2 %
NEUTROPHIL #: 31.68 10*3/uL — ABNORMAL HIGH (ref 1.50–7.70)
NEUTROPHIL %: 96 %
RBC MORPHOLOGY: NORMAL

## 2021-01-03 LAB — COMPREHENSIVE METABOLIC PANEL, NON-FASTING
ALBUMIN: 3 g/dL — ABNORMAL LOW (ref 3.4–4.8)
ALKALINE PHOSPHATASE: 75 U/L (ref 45–115)
ALT (SGPT): 28 U/L (ref 10–55)
ANION GAP: 8 mmol/L (ref 4–13)
AST (SGOT): 11 U/L (ref 8–45)
BILIRUBIN TOTAL: 0.7 mg/dL (ref 0.3–1.3)
BUN/CREA RATIO: 25 — ABNORMAL HIGH (ref 6–22)
BUN: 23 mg/dL (ref 8–25)
CALCIUM: 8.6 mg/dL — ABNORMAL LOW (ref 8.8–10.2)
CHLORIDE: 103 mmol/L (ref 96–111)
CO2 TOTAL: 22 mmol/L — ABNORMAL LOW (ref 23–31)
CREATININE: 0.92 mg/dL (ref 0.75–1.35)
ESTIMATED GFR: 87 mL/min/BSA (ref >=60)
GLUCOSE: 128 mg/dL — ABNORMAL HIGH (ref 65–125)
POTASSIUM: 4.6 mmol/L (ref 3.5–5.1)
PROTEIN TOTAL: 5.6 g/dL — ABNORMAL LOW (ref 6.0–8.0)
SODIUM: 133 mmol/L — ABNORMAL LOW (ref 136–145)

## 2021-01-03 LAB — ADULT ROUTINE BLOOD CULTURE, SET OF 2 BOTTLES (BACTERIA AND YEAST)

## 2021-01-03 LAB — PROCALCITONIN: PROCALCITONIN: 0.08 ng/mL

## 2021-01-03 LAB — MAGNESIUM: MAGNESIUM: 2.3 mg/dL (ref 1.8–2.6)

## 2021-01-03 MED ORDER — LIDOCAINE 1 %-EPINEPHRINE 1:100,000 INJECTION SOLUTION
15.0000 mL | INTRAMUSCULAR | Status: AC
Start: 2021-01-03 — End: 2021-01-03
  Administered 2021-01-03: 11:00:00 150 mg via INTRADERMAL
  Filled 2021-01-03: qty 20

## 2021-01-03 MED ORDER — MORPHINE 4 MG/ML INTRAVENOUS SOLUTION
INTRAVENOUS | Status: AC
Start: 2021-01-03 — End: 2021-01-03
  Administered 2021-01-03: 11:00:00 4 mg via INTRAVENOUS
  Filled 2021-01-03: qty 1

## 2021-01-03 MED ORDER — HYDROMORPHONE 1 MG/ML INJECTION WRAPPER
2.0000 mg | INJECTION | INTRAMUSCULAR | Status: DC | PRN
Start: 2021-01-03 — End: 2021-01-03

## 2021-01-03 MED ORDER — HYDROMORPHONE 1 MG/ML INJECTION WRAPPER
2.0000 mg | INJECTION | INTRAMUSCULAR | Status: DC
Start: 2021-01-03 — End: 2021-01-08
  Administered 2021-01-03: 12:00:00 0 mg via INTRAVENOUS

## 2021-01-03 MED ORDER — HYDROMORPHONE 1 MG/ML INJECTION WRAPPER
2.0000 mg | INJECTION | INTRAMUSCULAR | Status: DC | PRN
Start: 2021-01-03 — End: 2021-01-08
  Administered 2021-01-03 – 2021-01-07 (×5): 2 mg via INTRAVENOUS
  Filled 2021-01-03 (×5): qty 2

## 2021-01-03 MED ORDER — CEFEPIME 2G IN NS 100ML MINIBAG IVPB
2.0000 g | Freq: Two times a day (BID) | INTRAMUSCULAR | Status: DC
  Administered 2021-01-04 – 2021-01-05 (×3): 2 g via INTRAVENOUS
  Filled 2021-01-03 (×4): qty 100

## 2021-01-03 NOTE — OR Surgeon (Signed)
Operative Report      Patient Name:  [name]  Age:  75 y.o.  DOB:  01-Aug-1945  MRN:  [id number]    Date of Procedure:  01/03/2021       Procedure:    Incision and drainage of left inguinal subcutaneous abscess                      Pre-op Diagnosis:   Left inguinal abscess with recent history of dermatologic treatment of verrucous lesion left groin-history of prior spontaneous MRSA infections in the past    Post-op Diagnosis:  Same-cultures pending    Anesthesia:  Local anesthesia    Surgeon:   Drue Novel, MD    Complications:   None    Specimens:   Abscess cultures    Drains:   None    EBL:   Minimal    Procedure in detail:                   With the patient remaining in his hospital bed and with his wife at the bedside, the patient was given Dilaudid 2 mg intravenously in the left groin was prepped and draped in usual sterile fashion.  The overlying skin overlying the most fluctuant area of the left inguinal process was locally anesthetized with 1% xylocaine solution.  With local anesthesia adequate, the skin was incised sharply and dissection carried down through skin and subcutaneous tissue in blunt and sharp fashion.  Purulence was encountered and this was cultured.  This appeared to be a simple abscess at this point in limited only to the subcutaneous tissue plane.  There was no deeper extension noted.  There was no tunneling noted inferiorly, superiorly or to the sides.  The wound was then copiously irrigated with peroxide followed by saline irrigation.  The wound was then packed with dry gauze and recovered with appropriate dressing.  The patient tolerated this well.  I discussed with the family the need to let the wound heal by secondary intention and that we will await the final wound cultures.  We discussed the dressing changes required as well.  We also discussed the potential need for further procedures depending upon his healing process.  All questions answered and they were in agreement  with the plan.    Drue Novel, MD      This note was created using the [facility] Fluency Direct system and there may be some incorrect words, spelling and/or punctuation not noted/corrected prior to signature

## 2021-01-03 NOTE — Care Management Notes (Signed)
Langley Gauss, [place] at [facility] notified of admission, did not offer bed. Luciano Cutter, CASE MANAGER  01/03/2021, 08:38

## 2021-01-03 NOTE — Progress Notes (Signed)
[name] MEDICINE -[facility]   INPATIENT PROGRESS NOTE      [name], [name], 75 y.o. male  Date of Admission:  01/02/2021  Date of service: 01/03/2021  Date of Birth:  1945/10/03    Hospital Day:  LOS: 0 days      HPI: [name] is a 75 y.o., White male with PMH significant for HIV, HTN, HLD, prostate cancer, hernia repair, known pancreatic mass, hypothyroidism. Patient presented to ER with concerns of a left groin skin lesion. He reported he had a wart beside penis on left side frozen off by dermatology about 1 week ago. States 2 days ago he felt a "popping" sensation in left groin area and now area has become more swollen and painful. States it did drain some fluid 2 days ago. Denies CP, SOB, cough, fever, chills, abd pain. Patient with known pancreatic mass and is scheduled for EUS with biopsy in [facility] Monday 01-04-21.  States mass has been known since 2001 and he has had 2 previous EUS and had previously follows with the [place]. He reports a nonpruitic rash on chest and back - states appeared 2 days ago after receiving his moderna vaccine booster.   In ER he had CT abd/pelvis that showed soft tissue stranding left superficial inguinal region related to edema/cellulitis. Given Vanc and Zosyn. CT scan also noted enlarging pancreatic mass with findings concerning for direct invasion into the splenic vein  ER physician Dr Melbourne Abts spoke with vascular surgery - he stated " vascular surgery here was comfortable with keeping the patient but recommended that discussed the case with gastroenterology."  He then spoke with GI here who said that the patient needs an EUS they would need to go to [facility]. He then  spoke with the advanced gastroenterologist in [place] who stated that the patient has an appointment Monday for an EUS with biopsy outpatient.  He did not think patient to be transferred from a pancreatic mass/splenic pathology delineated above.   He felt the patient be treated for cellulitis and have his appointment on Monday a on outpatient basis.       Interval History:  Patient admitted with sepsis due to abscess of left groin.  Patient blood culture growing Gram-positive cocci in clusters.  WBC count has also increased to 33,000 with left shift.  I consulted General surgery for I&D.  I&D of the left groin abscess was performed by Dr. Mancel Bale today and deep wound culture obtained.  Will continue vancomycin cefepime for now and follow up on cultures.  Patient is immunocompromised due to HIV status.      Vital Signs:  Temperature: 36.9 C (98.4 F)  Heart Rate: 66  BP (Non-Invasive): (!) 148/92  Respiratory Rate: 20  SpO2: 91 %  Liter Flow (L/Min):      Intake & Output:    Intake/Output Summary (Last 24 hours) at 01/03/2021 1939  Last data filed at 01/03/2021 1324  Gross per 24 hour   Intake 2678 ml   Output 400 ml   Net 2278 ml     I/O current shift:  No intake/output data recorded.  Emesis:    BM:       Heme:      Physical Exam  Musculoskeletal:  General: Tenderness present.      Comments: On left groin swelling erythema and tenderness noted consistent with abscess           Current Medications:  acetaminophen (TYLENOL) tablet, 500 mg, Oral, Q4H PRN  amLODIPine (NORVASC) tablet, 5 mg, Oral, Daily  atorvastatin (LIPITOR) tablet, 80 mg, Oral, QPM  [START ON 01/04/2021] ceFEPime (MAXIPIME) 2g in NS 161mL IVPB minibag, 2 g, Intravenous, Q12H  cholecalciferol (VITAMIN D3) 1000 unit (25 mcg) tablet, 1,000 Units, Oral, Daily  docusate sodium (COLACE) capsule, 100 mg, Oral, 2x/day PRN  elvitegravir-cobicistat-emtricitrabine-tenofovir (GENVOYA) 150-150-200-10 mg per tablet, 1 Tablet, Oral, Daily  enoxaparin PF (LOVENOX) 40 mg/0.4 mL SubQ injection, 40 mg, Subcutaneous, Q24H  gabapentin (NEURONTIN) capsule, 300 mg, Oral, 3x/day  HYDROmorphone (DILAUDID) 1 mg/mL injection, 2 mg, Intravenous, Now  HYDROmorphone (DILAUDID) 1 mg/mL injection, 2 mg, Intravenous, Q4H  PRN  ipratropium-albuterol 0.5 mg-3 mg(2.5 mg base)/3 mL Solution for Nebulization, 3 mL, Nebulization, Q4H PRN  levothyroxine (SYNTHROID) tablet, 88 mcg, Oral, QAM  LR premix infusion, , Intravenous, Continuous  magnesium hydroxide (MILK OF MAGNESIA) 400mg  per 66mL oral liquid, 15 mL, Oral, Daily PRN  melatonin tablet, 10.5 mg, Oral, HS PRN  NS flush syringe, 3 mL, Intracatheter, Q8HRS  NS flush syringe, 3 mL, Intracatheter, Q1H PRN  NS flush syringe, 3 mL, Intracatheter, Q8HRS  NS flush syringe, 3 mL, Intracatheter, Q1H PRN  ondansetron (ZOFRAN) 2 mg/mL injection, 4 mg, Intravenous, Q6H PRN  oxyCODONE-acetaminophen 10-325mg  per tablet, 1 Tablet, Oral, Q8H PRN  pantoprazole (PROTONIX) delayed release tablet, 40 mg, Oral, Daily  tamsulosin (FLOMAX) capsule, 0.4 mg, Oral, 2x/day  traMADol (ULTRAM) tablet, 50 mg, Oral, Q6H PRN  vancomycin (VANCOCIN) 1,250 mg in NS 250 mL IVPB, 15 mg/kg (Adjusted), Intravenous, Q18H  Vancomycin IV - Pharmacist to Dose per Protocol, , Does not apply, Daily PRN        No Known Allergies    Labs:  I have reviewed all lab results.    Labs:         CBC Results Differential Results   Recent Labs     01/02/21  0935 01/03/21  0531   WBC 24.1* 33.0*   HGB 14.3 14.6   HCT 42.6 44.3    Recent Labs     01/02/21  0935 01/03/21  0531   PMNS 86 96   LYMPHOCYTES  --  2   MONOCYTES 6 2   EOSINOPHIL  --  0   BASOPHILS 0  <0.10 0  <0.10   PMNABS 21.01* 31.68*   LYMPHSABS 1.37  --    MONOSABS 1.32* 0.66   EOSABS <0.10 <0.10      BMP Results Other Chemistries Results   Recent Labs     01/02/21  0935 01/03/21  0531   SODIUM 135* 133*   POTASSIUM 4.3 4.6   CHLORIDE 106 103   CO2 20* 22*   BUN 31* 23   CREATININE 1.17 0.92   GFR 65 87   ANIONGAP 9 8    Recent Labs     01/02/21  0935 01/03/21  0531   CALCIUM 8.5* 8.6*   ALBUMIN 3.1* 3.0*   MAGNESIUM  --  2.3      Liver/Pancreas Enzyme Results Blood Gas     Recent Labs     01/02/21  0935 01/03/21  0531   TOTALPROTEIN 5.9* 5.6*   ALBUMIN 3.1* 3.0*   AST 16 11    ALT 32 28   ALKPHOS 76  75    No results found for this encounter   Cardiac Results    Coags Results     No results for input(s): UHCEASTTROPI, CKMB, MBINDEX, BNP in the last 72 hours. No results for input(s): INR in the last 72 hours.    Invalid input(s): PTT, PT     Hospital Encounter on 01/02/21 (from the past 96 hour(s))   ADULT ROUTINE BLOOD CULTURE, SET OF 2 ADULT BOTTLES (BACTERIA AND YEAST)    Collection Time: 01/02/21 10:30 AM    Specimen: Blood   Culture Result Status    GRAM STAIN Gram Positive Cocci/Clusters (AA) Preliminary   ADULT ROUTINE BLOOD CULTURE, SET OF 2 ADULT BOTTLES (BACTERIA AND YEAST)    Collection Time: 01/02/21 10:30 AM    Specimen: Blood   Culture Result Status    GRAM STAIN Gram Positive Cocci/Clusters (AA) Preliminary   WOUND, SUPERFICIAL/NON-STERILE SITE, AEROBIC CULTURE AND GRAM STAIN    Collection Time: 01/03/21 11:29 AM    Specimen: Wound; Other   Culture Result Status    GRAM STAIN 1+ Rare PMNs Preliminary    GRAM STAIN No Organisms Seen Preliminary       Radiology Results:  CT ABDOMEN PELVIS W IV CONTRAST   Final Result   1.Nonspecific subcutaneous soft tissue stranding involving the left superficial inguinal region. Findings may relate to nonspecific edema/cellulitis.   2.Increased size of a large heterogeneously enhancing pancreatic tail mass with findings highly concerning for direct invasion into the splenic vein.   3.Colonic diverticulosis.                  Radiologist location ID: [id number]               Current Diet Order:  DIET REGULAR  MY ORDERS LAST 24 (24h ago, onward)     Start     Ordered    01/04/21 0600  ceFEPime (MAXIPIME) 2g in NS 136mL IVPB minibag  EVERY 12 HOURS         01/03/21 1832    01/03/21 1030  IP CONSULT TO GENERAL SURGERY Requested Provider; Drue Novel; 01/03/2021; 10:00 AM  ONE TIME        Comments: Nurse may enter secondary order if modifications/details needed.      Provider:  (Not yet assigned)    01/03/21 1017    01/03/21 1000  IP CONSULT  TO GENERAL SURGERY Requested Provider; Drue Novel  ONE TIME,   Status:  Canceled        Comments: Nurse may enter secondary order if modifications/details needed.      Provider:  (Not yet assigned)    01/03/21 0946    01/03/21 1000  IP CONSULT TO GENERAL SURGERY Requested Provider; Drue Novel  ONE TIME,   Status:  Canceled        Comments: Nurse may enter secondary order if modifications/details needed.      Provider:  (Not yet assigned)    01/03/21 0948               Prophylaxis:    DVT/PE  Adressed as Appropriate  Anticoagulants (last 24 hours)     Date/Time Action Medication Dose    01/03/21 1637 Given    enoxaparin PF (LOVENOX) 40 mg/0.4 mL SubQ injection 40 mg                Assessment  Active Hospital Problems    Diagnosis   . Primary Problem: Abscess of left groin--status post I&D  and packing today.  Continue broad-spectrum antibiotics.   . Gram-positive cocci bacteremia--------will consult Infectious Disease.  Will consider 2D echo as well.   . Sepsis due to cellulitis (CMS HCC)--------continue antibiotics.   . Lactic acidosis---------will repeat lactic acid tomorrow.   . 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   Marland Kitchen HIV infection (CMS HCC)------follow-up by Infectious Disease.  Continue meds management.   . Rash----   . Thyroid disorder-----meds management.   . Mixed hyperlipidemia------Mets management.   . Essential hypertension---meds management.          Plan:  Will continue vancomycin and cefepime for now.  Will continue follow-up panculture will consult Infectious Disease for expert opinion and recommendations.  Will consider 2D echo for possible involvement of heart valves.        Greig Right, MD  Hospitalist

## 2021-01-03 NOTE — Nurses Notes (Signed)
Resting quietly in bed. Denies need for pain meds at the present time. Call bell is in easy reach.

## 2021-01-03 NOTE — Consults (Signed)
[facility]  Surgical  Consult      Patient Name:  [name]  Age:  75 y.o.  Sex:  male  DOB:  04-08-1945  MRN:  [id number]  Date of Consultation:  01/03/2021    PCP:   [place]:          Left inguinal abscess/cellulitis-sepsis    HISTORY  OF  PRESENT ILLNESS:        This unfortunate 75 year old HIV-positive gentleman was admitted after undergoing dermatologic treatment for a verrucous lesion in the left groin several days ago.  He reports the onset of worsening swelling, erythema and pain culminating in him going to the emergency department last evening.  CT imaging revealed significant cellulitis in the left groin.  He has a coexisting left inguinal hernia as well but the cellulitis did not appear to have anything to do with the hernia.  I was contacted about the patient this morning per the hospitalist who was concerned about the patient rapidly developing sepsis.  I discussed with the patient and his wife present the need for urgent incision and drainage.  I discussed doing this at the bedside.  We discussed procedure and the risk/benefits.  All questions were answered and they wished proceed.    PAST  MEDICAL  HISTORY:        HIV positive, history of prostate cancer, GERD, hyperlipidemia, hypertension, hypothyroidism, BPH, history of spontaneous MRSA infection in the past    PAST  SURGICAL  HISTORY:        History of perforated diverticulitis with Hartmann's procedure and subsequent reversal of Hartmann's procedure., history of spontaneous right hip MRSA sepsis, history of right inguinal hernia repair in the remote past, history of right orthopedic wrist surgery in the past    HOME  MEDICATIONS:     Melatonin, hydrocodone, stool softeners, amlodipine, Lipitor, Alafen, Prilosec, Flomax    ALLERGIES:       No known drug allergies    SOCIAL  HISTORY:      Patient denies any significant alcohol tobacco use.  He is married.    FAMILY  MEDICAL  HISTORY:         Noncontributory    REVIEW  OF  SYSTEMS:      The ROS was negative except per HPI    PHYSICAL  EXAMINATION:     Vital Signs:  BP (!) 148/92   Pulse 66   Temp 36.9 C (98.4 F)   Resp 20   Ht 1.803 m (5\' 11" )   Wt 80.1 kg (176 lb 9.6 oz)   SpO2 91%   BMI 24.63 kg/m               General:   Elderly white male in no acute distress.  He is awake, alert and appropriate  HEENT:   Reveals no adenopathy, icterus or bruits  Lungs/Chest:   Clear bilaterally  Cardiac:   Regular rate and rhythm  Abd:   Soft, nondistended, nontender, there are multiple well-healed incisions present in the left lower quadrant as well as the midline consistent with his previous surgical history provided.  In the left groin, there is an approximate 4 x 5 cm area of induration, cellulitis, fluctuance consistent with CT findings.  Both testes were descended nontender.  There is no extension of the cellulitic change into the perineum or testicular areas.  Ext:  Reveals no edema distal pulses are intact  Neuro:  Grossly intact without focal motor or sensory deficits      Labs:  I have reviewed the pertinent lab results.      Radiology: I have reviewed the pertinent radiology results.    IMPRESSION / PLAN:     Left inguinal abscess status post recent dermatologic treatment for a verrucous lesion with prior history of MRSA infections in the past-at this point given his white blood cell count of 33,000 and his overall clinical state, I agree with the hospitalist opinion that the patient may well have impending sepsis.  I discussed with the patient and the wife the urgent nature of this issue in the option of incision and drainage at the bedside to help arrest this process if possible.  We discussed the procedure of incision and drainage as well as the risk/benefits.  All questions were answered and they wished to proceed.     I am not in continual contact with the Epic system.  If you have any questions or concerns regarding this patient,  please  utilize my pager with your contact number to contact me. I will call you back directly.  Please do not use the  Epic chat system to contact me at any time.    Drue Novel, MD     This note was created using the [facility] Fluency Direct system and there may be some incorrect words, spelling and/or punctuation not noted/corrected prior to signature

## 2021-01-03 NOTE — Care Plan (Signed)
Pt woke easily for meds this morning. Respirations easy/unlabored, on room air. He is very hard of hearing, wears a hearing aid. He denies any needs this morning. He said he continues to have pain in the left groin area. The area remains firm, red, and warm. Pt rates pain in that area a 6 of 10 on pain scale. He declined  to have any pain medication. Said he wanted to go without and see what happens today. Have reminded him of the pain scale and how to avoid his pain getting out of control for him. No other issues to note. Safety measures maintained. Call light within reach. Mayra Neer, RN  01/03/2021, 05:58

## 2021-01-03 NOTE — Care Plan (Signed)
Patient admitted with cellulitis left groin. Dr Mancel Bale consulted. I and D of left groin abscess performed at bedside without difficulty. Dressing in place,clean,dry and intact. Antibiotics and PRN pain meds ordered.

## 2021-01-03 NOTE — Nurses Notes (Signed)
Patient was moved from 511B to 501 as it is a private room, and he was concerned about his privacy in a semi private room. Dr Mancel Bale was consulted and performed an I and D of the abscessed area in the patient's left groin. The patient was premedicated and tolerated the procedure. His wife was at the bedside.

## 2021-01-04 ENCOUNTER — Encounter (HOSPITAL_COMMUNITY): Payer: Self-pay | Admitting: Anesthesiology

## 2021-01-04 ENCOUNTER — Inpatient Hospital Stay (HOSPITAL_COMMUNITY): Payer: 59

## 2021-01-04 ENCOUNTER — Inpatient Hospital Stay (HOSPITAL_COMMUNITY): Admission: RE | Admit: 2021-01-04 | Payer: Medicare Other | Source: Ambulatory Visit | Admitting: Gastroenterology

## 2021-01-04 ENCOUNTER — Encounter (HOSPITAL_COMMUNITY): Admission: RE | Payer: Self-pay | Source: Ambulatory Visit

## 2021-01-04 DIAGNOSIS — E079 Disorder of thyroid, unspecified: Secondary | ICD-10-CM

## 2021-01-04 DIAGNOSIS — E782 Mixed hyperlipidemia: Secondary | ICD-10-CM

## 2021-01-04 DIAGNOSIS — L0390 Cellulitis, unspecified: Secondary | ICD-10-CM | POA: Diagnosis present

## 2021-01-04 DIAGNOSIS — B20 Human immunodeficiency virus [HIV] disease: Secondary | ICD-10-CM

## 2021-01-04 DIAGNOSIS — I10 Essential (primary) hypertension: Secondary | ICD-10-CM

## 2021-01-04 DIAGNOSIS — A4101 Sepsis due to Methicillin susceptible Staphylococcus aureus: Secondary | ICD-10-CM

## 2021-01-04 DIAGNOSIS — E872 Acidosis, unspecified: Secondary | ICD-10-CM

## 2021-01-04 DIAGNOSIS — L03314 Cellulitis of groin: Secondary | ICD-10-CM

## 2021-01-04 DIAGNOSIS — Z9889 Other specified postprocedural states: Secondary | ICD-10-CM

## 2021-01-04 DIAGNOSIS — L02214 Cutaneous abscess of groin: Secondary | ICD-10-CM

## 2021-01-04 DIAGNOSIS — K869 Disease of pancreas, unspecified: Secondary | ICD-10-CM

## 2021-01-04 LAB — CBC WITH DIFF
BASOPHIL #: <0.1 10*3/uL (ref <=0.20)
BASOPHIL %: 0 %
EOSINOPHIL #: <0.1 10*3/uL (ref <=0.50)
EOSINOPHIL %: 0 %
HCT: 41.1 % (ref 38.9–52.0)
HGB: 13.5 g/dL (ref 13.4–17.5)
IMMATURE GRANULOCYTE #: 0.2 10*3/uL — ABNORMAL HIGH (ref <0.10)
IMMATURE GRANULOCYTE %: 1 % (ref 0–1)
LYMPHOCYTE #: 1.28 10*3/uL (ref 1.00–4.80)
LYMPHOCYTE %: 6 %
MCH: 30.8 pg (ref 26.0–32.0)
MCHC: 32.8 g/dL (ref 31.0–35.5)
MCV: 93.6 fL (ref 78.0–100.0)
MONOCYTE #: 1.27 10*3/uL — ABNORMAL HIGH (ref 0.20–1.10)
MONOCYTE %: 6 %
MPV: 11 fL (ref 8.7–12.5)
NEUTROPHIL #: 17.47 10*3/uL — ABNORMAL HIGH (ref 1.50–7.70)
NEUTROPHIL %: 87 %
PLATELETS: 134 10*3/uL — ABNORMAL LOW (ref 150–400)
RBC: 4.39 10*6/uL — ABNORMAL LOW (ref 4.50–6.10)
RDW-CV: 13.5 % (ref 11.5–15.5)
WBC: 20.3 10*3/uL — ABNORMAL HIGH (ref 3.7–11.0)

## 2021-01-04 LAB — COMPREHENSIVE METABOLIC PANEL, NON-FASTING
ALBUMIN: 2.6 g/dL — ABNORMAL LOW (ref 3.4–4.8)
ALKALINE PHOSPHATASE: 70 U/L (ref 45–115)
ALT (SGPT): 22 U/L (ref 10–55)
ANION GAP: 5 mmol/L (ref 4–13)
AST (SGOT): 9 U/L (ref 8–45)
BILIRUBIN TOTAL: 0.6 mg/dL (ref 0.3–1.3)
BUN/CREA RATIO: 22 (ref 6–22)
BUN: 20 mg/dL (ref 8–25)
CALCIUM: 8.2 mg/dL — ABNORMAL LOW (ref 8.8–10.2)
CHLORIDE: 103 mmol/L (ref 96–111)
CO2 TOTAL: 25 mmol/L (ref 23–31)
CREATININE: 0.91 mg/dL (ref 0.75–1.35)
ESTIMATED GFR: 88 mL/min/BSA (ref >=60)
GLUCOSE: 165 mg/dL — ABNORMAL HIGH (ref 65–125)
POTASSIUM: 4.3 mmol/L (ref 3.5–5.1)
PROTEIN TOTAL: 5.2 g/dL — ABNORMAL LOW (ref 6.0–8.0)
SODIUM: 133 mmol/L — ABNORMAL LOW (ref 136–145)

## 2021-01-04 LAB — C-REACTIVE PROTEIN(CRP),INFLAMMATION: CRP INFLAMMATION: 48.1 mg/L — ABNORMAL HIGH (ref <8.0)

## 2021-01-04 LAB — VANCOMYCIN, TROUGH: VANCOMYCIN TROUGH: 7 ug/mL — ABNORMAL LOW (ref 10.0–20.0)

## 2021-01-04 LAB — LACTIC ACID LEVEL: LACTIC ACID: 1.5 mmol/L (ref 0.5–2.2)

## 2021-01-04 LAB — PROCALCITONIN: PROCALCITONIN: 0.08 ng/mL

## 2021-01-04 LAB — SYPHILIS, RAPID PLASMIN REAGIN (RPR), WITH TITER REFLEX: RPR QUALITATIVE: NONREACTIVE

## 2021-01-04 LAB — MAGNESIUM: MAGNESIUM: 2.1 mg/dL (ref 1.8–2.6)

## 2021-01-04 SURGERY — ENDOSCOPIC U/S UPPER
Anesthesia: Monitor Anesthesia Care | Site: Mouth

## 2021-01-04 MED ORDER — SODIUM CHLORIDE 0.9 % INJECTION SOLUTION
2.0000 mL | INTRAVENOUS | Status: DC
Start: 2021-01-04 — End: 2021-01-08

## 2021-01-04 MED ORDER — LIDOCAINE (PF) 100 MG/5 ML (2 %) INTRAVENOUS SYRINGE
INJECTION | INTRAVENOUS | Status: AC
Start: 2021-01-04 — End: 2021-01-04
  Filled 2021-01-04: qty 5

## 2021-01-04 MED ORDER — VANCOMYCIN 5 GRAM INTRAVENOUS SOLUTION
15.0000 mg/kg | Freq: Two times a day (BID) | INTRAVENOUS | Status: DC
Start: 2021-01-05 — End: 2021-01-08
  Administered 2021-01-04: 1250 mg via INTRAVENOUS
  Administered 2021-01-05: 02:00:00 0 mg via INTRAVENOUS
  Administered 2021-01-05: 1250 mg via INTRAVENOUS
  Administered 2021-01-05: 14:00:00 0 mg via INTRAVENOUS
  Administered 2021-01-05 – 2021-01-06 (×2): 1250 mg via INTRAVENOUS
  Administered 2021-01-06 – 2021-01-07 (×3): 0 mg via INTRAVENOUS
  Administered 2021-01-07: 12:00:00 1250 mg via INTRAVENOUS
  Administered 2021-01-07: 14:00:00 0 mg via INTRAVENOUS
  Administered 2021-01-07 (×2): 1250 mg via INTRAVENOUS
  Filled 2021-01-04 (×8): qty 12.5

## 2021-01-04 MED ORDER — VANCOMYCIN 1G IN NS 250ML MINIBAG IVPB - 60 MIN INF
1000.0000 mg | Freq: Two times a day (BID) | INTRAVENOUS | Status: DC
Start: 2021-01-05 — End: 2021-01-04
  Filled 2021-01-04: qty 250

## 2021-01-04 MED ORDER — PROPOFOL 10 MG/ML INTRAVENOUS EMULSION
INTRAVENOUS | Status: AC
Start: 2021-01-04 — End: 2021-01-04
  Filled 2021-01-04: qty 50

## 2021-01-04 NOTE — Care Plan (Signed)
Pt has not slept much tonight, only dosing off at intervals. Respirations unlabored. Medicated twice this shift for pain in left groin. Dressing remains intact to left groin. On iv Vanc and cefepime. No distress noted. Safety maintained. Debe Coder, RN  01/04/2021, 05:16

## 2021-01-04 NOTE — Care Management Notes (Signed)
[facility] Management Initial Evaluation    Patient Name: [name]  Date of Birth: Apr 18, 1945  Sex: male  Date/Time of Admission: 01/02/2021  8:47 AM  Room/Bed: 501/A  Payor: [facility] [employer] / Plan: Jolayne Panther VACCN/OPTUM / Product Type: Managed Care /   PCP: [place]:   Preferred Pharmacy       [id number] [address], [place]    [address] [place] [address]    Phone: [phone] Fax: [phone]    Hours: Not open 24 hours    [place], [place] [address]    [address] [place] [address]    Phone: [phone] Fax: [phone]    Hours: Not open 24 hours          Emergency Contact Info:   Extended Emergency Contact Information  Primary Emergency Contact: [place] Phone: [phone]  Relation: Wife  Preferred language: English  Interpreter needed? No    History:   [name] is a 75 y.o., male, admitted inpatient    Height/Weight: 180.3 cm (5\' 11" ) / 80.1 kg (176 lb 9.6 oz)     LOS: 2 days   Admitting Diagnosis: Cellulitis [L03.90]    Assessment:      01/04/21 1136   Assessment Details   Assessment Type Admission   Living Environment   Lives With spouse   Living Arrangements house   Care Management Plan   Discharge Planning Status initial meeting   Discharge plan discussed with: Patient   Discharge Needs Assessment   Equipment Currently Used at Home none   Discharge Facility/Level of Care Needs Home (Patient/Family Member/other)(code 1)   Transportation Available family or friend will provide   Referral Information   Admission Type inpatient       Discharge Plan:  Home (Patient/Family Member/other) (code 1)  Patient denies using any DME, oxygen, home health services. Patient lives with his wife [name] in a house and will return there when discharged. Patient denies any discharge needs. The patient will continue to be evaluated for developing  discharge needs.     Case Manager: Delray Alt, CASE MANAGER  Phone: [phone]

## 2021-01-04 NOTE — Progress Notes (Signed)
[facility]  Surgical  Post-Operative  Note      Patient Name:  [name]  Age:  75 y.o.  DOB:  [birth year]  MRN:  [id number]  Date:  01/04/2021    Post Op Day:   1    Note:          The extent of erythema and cellulitic change appears to be improved.  The patient reports his analgesia is adequate and overall his pain is less as well.  Remains afebrile and his hemodynamics are satisfactory.  The wound dressings are dry.  The wound itself appears satisfactory with no continued purulent drainage and again the degree of cellulitic changes improved.  Blood cultures were positive.  The wound cultures are currently pending.  His white blood cell count is improved.  We will continue with IV antibiotics per the Infectious Disease team and with local wound care as well.    I am not in continual  contact with the Epic system.  If you have any questions/concerns regarding this patient,  please utilize my pager to contact me with your contact number and I will call you back directly.  Please do not use the Epic chat system to contact me at any time.      Drue Novel, MD      This note was created using the [facility] Fluency Direct system and there may be some incorrect words, spelling and/or punctuation not noted/corrected prior to signature

## 2021-01-04 NOTE — Care Plan (Signed)
Abscess to left groin dressing changed per order, upon irrigation drainage tan purulent, irrigated until clear. PRN pain medication given x2 this shift. Echo completed. No needs expressed at this time, will continue to monitor.

## 2021-01-04 NOTE — Progress Notes (Signed)
[name] MEDICINE -[facility]   INPATIENT PROGRESS NOTE      [name], [name], 75 y.o. male  Date of Admission:  01/02/2021  Date of service: 01/04/2021  Date of Birth:  May 23, 1945    Hospital Day:  LOS: 2 days      HPI: [name] is a 75 y.o., White male with PMH significant for HIV, HTN, HLD, prostate cancer, hernia repair, known pancreatic mass, hypothyroidism. Patient presented to ER with concerns of a left groin skin lesion. He reported he had a wart beside penis on left side frozen off by dermatology about 1 week ago. States 2 days ago he felt a "popping" sensation in left groin area and now area has become more swollen and painful. States it did drain some fluid 2 days ago. Denies CP, SOB, cough, fever, chills, abd pain. Patient with known pancreatic mass and is scheduled for EUS with biopsy in [facility] Monday 01-04-21.  States mass has been known since 2001 and he has had 2 previous EUS and had previously follows with the [place]. He reports a nonpruitic rash on chest and back - states appeared 2 days ago after receiving his moderna vaccine booster.   In ER he had CT abd/pelvis that showed soft tissue stranding left superficial inguinal region related to edema/cellulitis. Given Vanc and Zosyn. CT scan also noted enlarging pancreatic mass with findings concerning for direct invasion into the splenic vein  ER physician [name] spoke with vascular surgery - he stated " vascular surgery here was comfortable with keeping the patient but recommended that discussed the case with gastroenterology."  He then spoke with GI here who said that the patient needs an EUS they would need to go to [facility]. He then  spoke with the advanced gastroenterologist in [place] who stated that the patient has an appointment Monday for an EUS with biopsy outpatient.  He did not think patient to be transferred from a pancreatic mass/splenic pathology delineated above.   He felt the patient be treated for cellulitis and have his appointment on Monday a on outpatient basis.       Interval History:  Patient admitted with sepsis due to abscess of left groin.  Patient blood culture growing Gram-positive cocci in clusters.  WBC count has also increased to 33,000 with left shift.  I consulted General surgery for I&D.  I&D of the left groin abscess was performed by [name] today and deep wound culture obtained.  Will continue vancomycin cefepime for now and follow up on cultures.  Patient is immunocompromised due to HIV status.    01/04/2021----left groin erythema swelling and pain decreasing.  Leukocytosis is also improving.  Blood cultures growing Staph aureus however sensitivities not available yet.  Will continue vancomycin and cefepime for now I follow-up on final cultures.  Infectious Disease consultation is pending.  I will consult Hematology-Oncology for follow-up on the CT scan finding of pancreatic mass invading the splenic vein.  2D echo has been performed.  However official report is pending.    Vital Signs:  Temperature: 36.8 C (98.2 F)  Heart Rate: 75  BP (Non-Invasive): (!) 169/98  Respiratory Rate: 18  SpO2: 94 %  Liter Flow (L/Min):      Intake & Output:    Intake/Output Summary (  Last 24 hours) at 01/04/2021 1903  Last data filed at 01/04/2021 1800  Gross per 24 hour   Intake 720 ml   Output 1875 ml   Net -1155 ml     I/O current shift:  No intake/output data recorded.  Emesis:    BM:       Heme:      Physical Exam  Musculoskeletal:         General: Tenderness present.      Comments: On left groin swelling erythema and tenderness noted consistent with abscess           Current Medications:  acetaminophen (TYLENOL) tablet, 500 mg, Oral, Q4H PRN  amLODIPine (NORVASC) tablet, 5 mg, Oral, Daily  atorvastatin (LIPITOR) tablet, 80 mg, Oral, QPM  ceFEPime (MAXIPIME) 2g in NS 128mL IVPB minibag, 2 g, Intravenous, Q12H  cholecalciferol (VITAMIN D3) 1000 unit (25 mcg) tablet,  1,000 Units, Oral, Daily  docusate sodium (COLACE) capsule, 100 mg, Oral, 2x/day PRN  elvitegravir-cobicistat-emtricitrabine-tenofovir (GENVOYA) 150-150-200-10 mg per tablet, 1 Tablet, Oral, Daily  enoxaparin PF (LOVENOX) 40 mg/0.4 mL SubQ injection, 40 mg, Subcutaneous, Q24H  gabapentin (NEURONTIN) capsule, 300 mg, Oral, 3x/day  HYDROmorphone (DILAUDID) 1 mg/mL injection, 2 mg, Intravenous, Now  HYDROmorphone (DILAUDID) 1 mg/mL injection, 2 mg, Intravenous, Q4H PRN  ipratropium-albuterol 0.5 mg-3 mg(2.5 mg base)/3 mL Solution for Nebulization, 3 mL, Nebulization, Q4H PRN  levothyroxine (SYNTHROID) tablet, 88 mcg, Oral, QAM  LR premix infusion, , Intravenous, Continuous  magnesium hydroxide (MILK OF MAGNESIA) 400mg  per 59mL oral liquid, 15 mL, Oral, Daily PRN  melatonin tablet, 10.5 mg, Oral, HS PRN  NS flush syringe, 3 mL, Intracatheter, Q8HRS  NS flush syringe, 3 mL, Intracatheter, Q1H PRN  NS flush syringe, 3 mL, Intracatheter, Q8HRS  NS flush syringe, 3 mL, Intracatheter, Q1H PRN  ondansetron (ZOFRAN) 2 mg/mL injection, 4 mg, Intravenous, Q6H PRN  oxyCODONE-acetaminophen 10-325mg  per tablet, 1 Tablet, Oral, Q8H PRN  pantoprazole (PROTONIX) delayed release tablet, 40 mg, Oral, Daily  perflutren lipid microspheres (DEFINITY) 1.3 mL in NS 10 mL (tot vol) injection, 2 mL, Intravenous, Give in Cardiology  tamsulosin (FLOMAX) capsule, 0.4 mg, Oral, 2x/day  traMADol (ULTRAM) tablet, 50 mg, Oral, Q6H PRN  [START ON 01/05/2021] vancomycin (VANCOCIN) 1,250 mg in NS 250 mL IVPB, 15 mg/kg (Adjusted), Intravenous, Q12H  Vancomycin IV - Pharmacist to Dose per Protocol, , Does not apply, Daily PRN        No Known Allergies    Labs:  I have reviewed all lab results.    Labs:         CBC Results Differential Results   Recent Labs     01/02/21  0935 01/03/21  0531 01/04/21  0500   WBC 24.1* 33.0* 20.3*   HGB 14.3 14.6 13.5   HCT 42.6 44.3 41.1    Recent Labs     01/02/21  0935 01/03/21  0531 01/04/21  0500   PMNS 86 96 87    LYMPHOCYTES  --  2  --    MONOCYTES 6 2 6    EOSINOPHIL  --  0  --    BASOPHILS 0   <0.10 0   <0.10 0   <0.10   PMNABS 21.01* 31.68* 17.47*   LYMPHSABS 1.37  --  1.28   MONOSABS 1.32* 0.66 1.27*   EOSABS <0.10 <0.10 <0.10      BMP Results Other Chemistries Results   Recent Labs     01/02/21  0935 01/03/21  0531  01/04/21  0500   SODIUM 135* 133* 133*   POTASSIUM 4.3 4.6 4.3   CHLORIDE 106 103 103   CO2 20* 22* 25   BUN 31* 23 20   CREATININE 1.17 0.92 0.91   GFR 65 87 88   ANIONGAP 9 8 5     Recent Labs     01/02/21  0935 01/03/21  0531 01/04/21  0500   CALCIUM 8.5* 8.6* 8.2*   ALBUMIN 3.1* 3.0* 2.6*   MAGNESIUM  --  2.3 2.1      Liver/Pancreas Enzyme Results Blood Gas     Recent Labs     01/02/21  0935 01/03/21  0531 01/04/21  0500   TOTALPROTEIN 5.9* 5.6* 5.2*   ALBUMIN 3.1* 3.0* 2.6*   AST 16 11 9    ALT 32 28 22   ALKPHOS 76 75 70    No results found for this encounter   Cardiac Results    Coags Results     No results for input(s): UHCEASTTROPI, CKMB, MBINDEX, BNP in the last 72 hours. No results for input(s): INR in the last 72 hours.    Invalid input(s): PTT, PT     Hospital Encounter on 01/02/21 (from the past 96 hour(s))   ADULT ROUTINE BLOOD CULTURE, SET OF 2 ADULT BOTTLES (BACTERIA AND YEAST)    Collection Time: 01/02/21 10:30 AM    Specimen: Blood   Culture Result Status    BLOOD CULTURE, ROUTINE Staphylococcus aureus (A) Preliminary    GRAM STAIN Gram Positive Cocci/Clusters (AA) Preliminary   ADULT ROUTINE BLOOD CULTURE, SET OF 2 ADULT BOTTLES (BACTERIA AND YEAST)    Collection Time: 01/02/21 10:30 AM    Specimen: Blood   Culture Result Status    BLOOD CULTURE, ROUTINE Staphylococcus aureus (A) Preliminary    GRAM STAIN Gram Positive Cocci/Clusters (AA) Preliminary   WOUND, SUPERFICIAL/NON-STERILE SITE, AEROBIC CULTURE AND GRAM STAIN    Collection Time: 01/03/21 11:29 AM    Specimen: Wound; Other   Culture Result Status    GRAM STAIN 1+ Rare PMNs Preliminary    GRAM STAIN No Organisms Seen Preliminary        Radiology Results:  CT ABDOMEN PELVIS W IV CONTRAST   Final Result   1.Nonspecific subcutaneous soft tissue stranding involving the left superficial inguinal region. Findings may relate to nonspecific edema/cellulitis.   2.Increased size of a large heterogeneously enhancing pancreatic tail mass with findings highly concerning for direct invasion into the splenic vein.   3.Colonic diverticulosis.                  Radiologist location ID: [id number]               Current Diet Order:  DIET REGULAR  MY ORDERS LAST 24 (24h ago, onward)     Start     Ordered    01/05/21 0730  IP CONSULT TO HEMATOLOGY/ONCOLOGY Requested Provider; SHAFI, [place]        Comments: Nurse may enter secondary order if modifications/details needed.      Provider:  (Not yet assigned)    01/04/21 1903    01/04/21 1130  TRANSTHORACIC ECHOCARDIOGRAM - ADULT  (TRANSTHORACIC ECHOCARDIOGRAM - ADULT (please do not unselect either order - cardiology will decide per protocol if the contrast is actually given or not))  ONE TIME        Comments: If not done in the last six months.    01/04/21 1119    01/04/21  [place] INFECTIOUS DISEASES Left Groin abscess. Staph aureus bacteremia; Requested Provider; Alinda Money; 01/04/2021; 11:17 AM  ONE TIME        Comments: Nurse may enter secondary order if modifications/details needed.      Provider:  (Not yet assigned)    01/04/21 1121    01/04/21 1117  perflutren lipid microspheres (DEFINITY) 1.3 mL in NS 10 mL (tot vol) injection  (TRANSTHORACIC ECHOCARDIOGRAM - ADULT (please do not unselect either order - cardiology will decide per protocol if the contrast is actually given or not))  GIVE IN CARDIOLOGY         01/04/21 1119    01/04/21 1115  IP CONSULT TO INFECTIOUS DISEASES Left Groin abscess. Staph aureus bacteremia; Requested Provider; Alinda Money  ONE TIME,   Status:  Canceled        Comments: Nurse may enter secondary order if modifications/details needed.      Provider:  (Not yet  assigned)    01/04/21 1115    01/04/21 0530  CBC/DIFF  0530 - AM DRAW (labs only)         01/03/21 1947    01/04/21 0530  MAGNESIUM  0530 - AM DRAW (labs only)         01/03/21 1947    01/04/21 0530  COMPREHENSIVE METABOLIC PANEL, NON-FASTING  0530 - AM DRAW (labs only)         01/03/21 1947    01/04/21 0530  LACTIC ACID LEVEL  0530 - AM DRAW (labs only)         01/03/21 1947    01/04/21 0530  C-REACTIVE PROTEIN(CRP),INFLAMMATION  0530 - AM DRAW (labs only)         01/03/21 1947    01/04/21 0530  PROCALCITONIN  0530 - AM DRAW (labs only)         01/03/21 1947    01/04/21 0530  CBC WITH DIFF  PROCEDURE ONCE         01/04/21 0017               Prophylaxis:    DVT/PE  Adressed as Appropriate  Anticoagulants (last 24 hours)     Date/Time Action Medication Dose    01/04/21 1720 Given    enoxaparin PF (LOVENOX) 40 mg/0.4 mL SubQ injection 40 mg                Assessment  Active Hospital Problems    Diagnosis    Primary Problem: Abscess of left groin--status post I&D and packing today.  Continue broad-spectrum antibiotics.    Gram-positive cocci bacteremia--------will consult Infectious Disease.  Will consider 2D echo as well.    Sepsis due to cellulitis (CMS HCC)--------continue antibiotics.    Lactic acidosis---------will repeat lactic acid tomorrow.    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    HIV infection (CMS HCC)------follow-up by Infectious Disease.  Continue meds management.    Rash----    Thyroid disorder-----meds management.    Mixed hyperlipidemia------Mets management.    Essential hypertension---meds management.          Plan:  Will continue vancomycin and cefepime for now.  Blood culture growing Staph aureus.  Sensitivities pending.  Will consult Oncology for follow-up on the pancreatic mass invading the splenic vein.  Will follow-up on results from 2D echo as well.    Greig Right, MD  Hospitalist

## 2021-01-05 ENCOUNTER — Encounter (HOSPITAL_COMMUNITY): Payer: Self-pay | Admitting: Infection Control

## 2021-01-05 DIAGNOSIS — I3139 Other pericardial effusion (noninflammatory): Secondary | ICD-10-CM

## 2021-01-05 DIAGNOSIS — A4102 Sepsis due to Methicillin resistant Staphylococcus aureus: Principal | ICD-10-CM

## 2021-01-05 DIAGNOSIS — L02214 Cutaneous abscess of groin: Secondary | ICD-10-CM

## 2021-01-05 DIAGNOSIS — R7881 Bacteremia: Secondary | ICD-10-CM

## 2021-01-05 DIAGNOSIS — B9562 Methicillin resistant Staphylococcus aureus infection as the cause of diseases classified elsewhere: Secondary | ICD-10-CM

## 2021-01-05 DIAGNOSIS — Z9889 Other specified postprocedural states: Secondary | ICD-10-CM

## 2021-01-05 DIAGNOSIS — B20 Human immunodeficiency virus [HIV] disease: Secondary | ICD-10-CM

## 2021-01-05 DIAGNOSIS — B9561 Methicillin susceptible Staphylococcus aureus infection as the cause of diseases classified elsewhere: Secondary | ICD-10-CM

## 2021-01-05 LAB — CBC WITH DIFF
BASOPHIL #: <0.1 10*3/uL (ref <=0.20)
BASOPHIL %: 0 %
EOSINOPHIL #: <0.1 10*3/uL (ref <=0.50)
EOSINOPHIL %: 0 %
HCT: 43.2 % (ref 38.9–52.0)
HGB: 14.3 g/dL (ref 13.4–17.5)
IMMATURE GRANULOCYTE #: 0.18 10*3/uL — ABNORMAL HIGH (ref <0.10)
IMMATURE GRANULOCYTE %: 1 % (ref 0–1)
LYMPHOCYTE #: 1.14 10*3/uL (ref 1.00–4.80)
LYMPHOCYTE %: 7 %
MCH: 30.8 pg (ref 26.0–32.0)
MCHC: 33.1 g/dL (ref 31.0–35.5)
MCV: 92.9 fL (ref 78.0–100.0)
MONOCYTE #: 0.71 10*3/uL (ref 0.20–1.10)
MONOCYTE %: 4 %
MPV: 10.7 fL (ref 8.7–12.5)
NEUTROPHIL #: 13.96 10*3/uL — ABNORMAL HIGH (ref 1.50–7.70)
NEUTROPHIL %: 88 %
PLATELETS: 150 10*3/uL (ref 150–400)
RBC: 4.65 10*6/uL (ref 4.50–6.10)
RDW-CV: 13.6 % (ref 11.5–15.5)
WBC: 16 10*3/uL — ABNORMAL HIGH (ref 3.7–11.0)

## 2021-01-05 LAB — BASIC METABOLIC PANEL
ANION GAP: 9 mmol/L (ref 4–13)
BUN/CREA RATIO: 19 (ref 6–22)
BUN: 18 mg/dL (ref 8–25)
CALCIUM: 8.5 mg/dL — ABNORMAL LOW (ref 8.8–10.2)
CHLORIDE: 103 mmol/L (ref 96–111)
CO2 TOTAL: 24 mmol/L (ref 23–31)
CREATININE: 0.94 mg/dL (ref 0.75–1.35)
ESTIMATED GFR: 85 mL/min/BSA (ref >=60)
GLUCOSE: 203 mg/dL — ABNORMAL HIGH (ref 65–125)
POTASSIUM: 4.2 mmol/L (ref 3.5–5.1)
SODIUM: 136 mmol/L (ref 136–145)

## 2021-01-05 LAB — MAGNESIUM: MAGNESIUM: 2.2 mg/dL (ref 1.8–2.6)

## 2021-01-05 LAB — WOUND, SUPERFICIAL/NON-STERILE SITE, AEROBIC CULTURE AND GRAM STAIN: GRAM STAIN: NONE SEEN

## 2021-01-05 LAB — ADULT ROUTINE BLOOD CULTURE, SET OF 2 BOTTLES (BACTERIA AND YEAST)

## 2021-01-05 LAB — ANAEROBIC CULTURE

## 2021-01-05 NOTE — Progress Notes (Signed)
[facility]                                     HOSPITALIST PROGRESS NOTE      [name], [name], 75 y.o. male  Date of Admission:  01/02/2021  Date of service: 01/05/2021  Date of Birth:  25-Feb-1945    Hospital Day:  LOS: 3 days     Interval History:   [name] Caul Montgomeryis a 75 y.o.,[place] PMH significant for HIV, HTN, HLD, prostate cancer, hernia repair, known pancreatic mass scheduled for EUS with biopsy in [facility] Monday 01-04-21, hypothyroidism.[name]  Patient presented to ER with concerns of a left groin skin lesion. He reported he had a wart beside penis on left side frozen off by dermatologyabout 1 week ago. States2 days agohe felt a "popping" sensation in left groin areaand now area has become more swollen and painful.CT scan also noted enlarging pancreatic mass with findings concerning for direct invasion into the splenic vein    01/05/2021:  No acute issues overnight, his MRSA bacteremia, echo done and has not resulted yet.      Vital Signs:  Temperature: 36.9 C (98.4 F)  Heart Rate: 76  BP (Non-Invasive): (!) 154/85  Respiratory Rate: 18  SpO2: 95 %  Liter Flow (L/Min):      Intake & Output:    Intake/Output Summary (Last 24 hours) at 01/05/2021 0955  Last data filed at 01/05/2021 4503  Gross per 24 hour   Intake 720 ml   Output 1900 ml   Net -1180 ml     I/O current shift:  12/06 0700 - 12/06 1859  In: 240 [P.O.:240]  Out: 500 [Urine:500]  Emesis:    BM:       Heme:      Physical Exam  Vitals and nursing note reviewed.   Constitutional:       General: He is not in acute distress.     Appearance: Normal appearance. He is not ill-appearing.   HENT:      Head: Normocephalic and atraumatic.      Mouth/Throat:      Mouth: Mucous membranes are moist.      Pharynx: Oropharynx is clear.   Cardiovascular:      Rate and Rhythm: Normal rate and regular rhythm.      Heart sounds: No murmur heard.  Pulmonary:      Effort: Pulmonary effort is normal.      Breath sounds:  Normal breath sounds. No wheezing, rhonchi or rales.   Abdominal:      General: Abdomen is flat. There is no distension.      Palpations: Abdomen is soft.      Tenderness: There is no abdominal tenderness.      Hernia: No hernia is present.   Genitourinary:     Comments: Left groin packed wound  Musculoskeletal:         General: No swelling. Normal range of motion.      Right lower leg: No edema.      Left lower leg: No edema.   Skin:     General: Skin is warm and dry.      Capillary Refill: Capillary refill takes less than 2 seconds.      Findings: No bruising or erythema.   Neurological:      General: No focal deficit present.      Mental  Status: He is alert and oriented to person, place, and time. Mental status is at baseline.   Psychiatric:         Mood and Affect: Mood normal.         Behavior: Behavior normal.         Judgment: Judgment normal.           Current Medications:  acetaminophen (TYLENOL) tablet, 500 mg, Oral, Q4H PRN  amLODIPine (NORVASC) tablet, 5 mg, Oral, Daily  atorvastatin (LIPITOR) tablet, 80 mg, Oral, QPM  ceFEPime (MAXIPIME) 2g in NS 17mL IVPB minibag, 2 g, Intravenous, Q12H  cholecalciferol (VITAMIN D3) 1000 unit (25 mcg) tablet, 1,000 Units, Oral, Daily  docusate sodium (COLACE) capsule, 100 mg, Oral, 2x/day PRN  elvitegravir-cobicistat-emtricitrabine-tenofovir (GENVOYA) 150-150-200-10 mg per tablet, 1 Tablet, Oral, Daily  enoxaparin PF (LOVENOX) 40 mg/0.4 mL SubQ injection, 40 mg, Subcutaneous, Q24H  gabapentin (NEURONTIN) capsule, 300 mg, Oral, 3x/day  HYDROmorphone (DILAUDID) 1 mg/mL injection, 2 mg, Intravenous, Now  HYDROmorphone (DILAUDID) 1 mg/mL injection, 2 mg, Intravenous, Q4H PRN  ipratropium-albuterol 0.5 mg-3 mg(2.5 mg base)/3 mL Solution for Nebulization, 3 mL, Nebulization, Q4H PRN  levothyroxine (SYNTHROID) tablet, 88 mcg, Oral, QAM  LR premix infusion, , Intravenous, Continuous  magnesium hydroxide (MILK OF MAGNESIA) 400mg  per 22mL oral liquid, 15 mL, Oral, Daily  PRN  melatonin tablet, 10.5 mg, Oral, HS PRN  NS flush syringe, 3 mL, Intracatheter, Q8HRS  NS flush syringe, 3 mL, Intracatheter, Q1H PRN  NS flush syringe, 3 mL, Intracatheter, Q8HRS  NS flush syringe, 3 mL, Intracatheter, Q1H PRN  ondansetron (ZOFRAN) 2 mg/mL injection, 4 mg, Intravenous, Q6H PRN  oxyCODONE-acetaminophen 10-325mg  per tablet, 1 Tablet, Oral, Q8H PRN  pantoprazole (PROTONIX) delayed release tablet, 40 mg, Oral, Daily  perflutren lipid microspheres (DEFINITY) 1.3 mL in NS 10 mL (tot vol) injection, 2 mL, Intravenous, Give in Cardiology  tamsulosin (FLOMAX) capsule, 0.4 mg, Oral, 2x/day  traMADol (ULTRAM) tablet, 50 mg, Oral, Q6H PRN  vancomycin (VANCOCIN) 1,250 mg in NS 250 mL IVPB, 15 mg/kg (Adjusted), Intravenous, Q12H  Vancomycin IV - Pharmacist to Dose per Protocol, , Does not apply, Daily PRN        No Known Allergies    Labs:  I have reviewed all lab results.    Labs:         CBC Results Differential Results   Recent Labs     01/03/21  0531 01/04/21  0500 01/05/21  0425   WBC 33.0* 20.3* 16.0*   HGB 14.6 13.5 14.3   HCT 44.3 41.1 43.2    Recent Labs     01/03/21  0531 01/04/21  0500 01/05/21  0425   PMNS 96 87 88   LYMPHOCYTES 2  --   --    MONOCYTES 2 6 4    EOSINOPHIL 0  --   --    BASOPHILS 0  <0.10 0  <0.10 0  <0.10   PMNABS 31.68* 17.47* 13.96*   LYMPHSABS  --  1.28 1.14   MONOSABS 0.66 1.27* 0.71   EOSABS <0.10 <0.10 <0.10      BMP Results Other Chemistries Results   Recent Labs     01/03/21  0531 01/04/21  0500 01/05/21  0425   SODIUM 133* 133* 136   POTASSIUM 4.6 4.3 4.2   CHLORIDE 103 103 103   CO2 22* 25 24   BUN 23 20 18    CREATININE 0.92 0.91 0.94   GFR 87 88 85   ANIONGAP  8 5 9     Recent Labs     01/03/21  0531 01/04/21  0500 01/05/21  0425   CALCIUM 8.6* 8.2* 8.5*   ALBUMIN 3.0* 2.6*  --    MAGNESIUM 2.3 2.1 2.2      Liver/Pancreas Enzyme Results Blood Gas     Recent Labs     01/03/21  0531 01/04/21  0500   TOTALPROTEIN 5.6* 5.2*   ALBUMIN 3.0* 2.6*   AST 11 9   ALT 28 22    ALKPHOS 75 70    No results found for this encounter   Cardiac Results    Coags Results     No results for input(s): UHCEASTTROPI, CKMB, MBINDEX, BNP in the last 72 hours. No results for input(s): INR in the last 72 hours.    Invalid input(s): PTT, PT     Hospital Encounter on 01/02/21 (from the past 96 hour(s))   ADULT ROUTINE BLOOD CULTURE, SET OF 2 ADULT BOTTLES (BACTERIA AND YEAST)    Collection Time: 01/02/21 10:30 AM    Specimen: Blood   Culture Result Status    BLOOD CULTURE, ROUTINE Methicillin Resistant Staphylococcus aureus (A) Final    GRAM STAIN Gram Positive Cocci/Clusters (AA) Final       Susceptibility    Methicillin Resistant Staphylococcus aureus -  (no method available)     Penicillin >=0.5 Resistant      Oxacillin >=4 Resistant      Gentamicin <=0.5 Sensitive      Ciprofloxacin >=8 Resistant      Levofloxacin 4 Resistant      Inducible Clindamycin Resistance Neg       Erythromycin >=8 Resistant      Clindamycin <=0.25 Sensitive      Linezolid 2 Sensitive      Vancomycin 1 Sensitive      Tetracycline <=1 Sensitive      Rifampin <=0.5 Sensitive      Trimethoprim/Sulfamethoxazole <=10 Sensitive    ADULT ROUTINE BLOOD CULTURE, SET OF 2 ADULT BOTTLES (BACTERIA AND YEAST)    Collection Time: 01/02/21 10:30 AM    Specimen: Blood   Culture Result Status    BLOOD CULTURE, ROUTINE Methicillin Resistant Staphylococcus aureus (A) Final    GRAM STAIN Gram Positive Cocci/Clusters (AA) Final   WOUND, SUPERFICIAL/NON-STERILE SITE, AEROBIC CULTURE AND GRAM STAIN    Collection Time: 01/03/21 11:29 AM    Specimen: Wound; Other   Culture Result Status    WOUND CULTURE Methicillin Resistant Staphylococcus aureus (A) Final    GRAM STAIN 1+ Rare PMNs Final    GRAM STAIN No Organisms Seen Final       Susceptibility    Methicillin Resistant Staphylococcus aureus -  (no method available)     Penicillin >=0.5 Resistant      Oxacillin >=4 Resistant      Gentamicin <=0.5 Sensitive      Ciprofloxacin >=8 Resistant       Levofloxacin 4 Resistant      Inducible Clindamycin Resistance Neg       Erythromycin >=8 Resistant      Clindamycin >=8 Resistant      Linezolid 2 Sensitive      Vancomycin <=0.5 Sensitive      Tetracycline <=1 Sensitive      Rifampin <=0.5 Sensitive      Trimethoprim/Sulfamethoxazole <=10 Sensitive        Radiology Results:  CT ABDOMEN PELVIS W IV CONTRAST   Final Result   1.Nonspecific subcutaneous soft tissue  stranding involving the left superficial inguinal region. Findings may relate to nonspecific edema/cellulitis.   2.Increased size of a large heterogeneously enhancing pancreatic tail mass with findings highly concerning for direct invasion into the splenic vein.   3.Colonic diverticulosis.                  Radiologist location ID: [id number]               Current Diet Order:  DIET REGULAR    Prophylaxis:    DVT/PE  Adressed as Appropriate         Assessment  Active Hospital Problems    Diagnosis   . Primary Problem: Abscess of left groin   . Cellulitis   . Gram-positive cocci bacteremia   . Sepsis due to cellulitis (CMS [place])   . Lactic acidosis   . Rash   . Thyroid disorder   . Pancreatic mass   . Mixed hyperlipidemia   . Essential hypertension   . HIV infection (CMS [place])          Plan:   Left groin abscess: Status post incision drainage, growing Gram-positive cocci, on vanc and cefepime.  Wound culture growing MRSA, continue vancomycin    MRSA bacteremia:  Id follow, echo pending, continue cefepime vancomycin.     Leukocytosis:  Trended down to normal White blood cell count trending down,  Lactic acidosis:    Pancreatic mass:  Patient has an appointment for EUS 01/20/2021 and [place]    HIV:  Continue medical management    Other chronic medical condition including hypothyroidism, mixed hyperlipidemia, essential hypertension    DVT prophylaxis: Lovenox    SERVICE-- HOSPITALIST    Tod Persia, MD

## 2021-01-05 NOTE — Consults (Signed)
[name]  Infectious Disease Initial Consult      [name], [name], 75 y.o. male  Date of Admission:  01/02/2021  Date of service: 01/05/2021  Date of Birth:  01-15-1946    Hospital Day:  LOS: 3 days     Requesting MD: Tod Persia, MD    Reason for Consult:  Sepsis secondary to left groin abscess, Gram-positive bacteremia    Impression/Recommendations:   Sepsis secondary to left groin abscess   Gram-positive bacteremia    Patient has a pertinent past medical history of HIV and prostate cancer  Recently seen outpatient in Dermatology office for penile warts and underwent cryotherapy and subsequently developed pain, swelling with purulent drainage  CT abdomen pelvis on admission shows fat stranding in left superficial inguinal region and enlargement of known pancreatic mass into splenic vein  WBC on admission 20.3--> 16.0, afebrile   Underwent I&D per General surgery recommendations on 12/4  2/2 blood cultures admission on 12/03 are positive for MRSA   Wound culture positive for MRSA, anaerobic cultures negative  Repeat blood cultures are in process   TTE was done on 12/05, report is pending  Completed 4 days of cefepime and vancomycin. Cefepime discontinued and currently is on Vanc- day 4  Continue ART in the form of Genvoya  Will continue to monitor clinical course and response to therapy    HPI/Discussion:  [name] is a 75 y.o., White male with a past medical history significant for HIV, hypertension, hyperlipidemia, prostate cancer, hernia repair, known pancreatic mass, hypothyroidism presented to ED with left groin lesion.  Patient was recently seen in the Dermatology Clinic outpatient for penile wart and underwent cryotherapy but subsequently developed pain, swelling and purulent drainage from left groin which prompted ED visit.  CT abdomen pelvis done in the ED showed fat stranding in left superficial inguinal region and enlarging pancreatic mass into splenic vein.  Patient  underwent I&D per General surgery recommendations on 12/04 and was empirically started on cefepime and vancomycin.  He was continued on Genvoya while inpatient for antiretroviral therapy.  Infectious diseases team consulted for antibiotic recommendations.    Past Medical History:   Diagnosis Date   . Cancer (CMS [facility])     prostate   . Deep vein thrombosis (DVT) (CMS [facility])     right leg   . Esophageal reflux    . H/O hearing loss    . High cholesterol    . 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 HTN (hypertension)    . Hyperlipidemia     "borderline"   . Hypothyroidism    . MRSA (methicillin resistant staph aureus) culture positive 01/02/2021    MRSA left groin abscess 01/03/21   . MRSA (methicillin resistant staph aureus) culture positive 01/02/2021    MRSA blood 01/02/21         Past Surgical History:   Procedure Laterality Date   . COLON SURGERY     . 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 Parke Simmers   . HX HERNIA REPAIR     . WRIST SURGERY Right          Family History:     Family Medical History:     Problem Relation (Age of Onset)    Cancer Mother, Father, Other    Heart Attack Mother    High  Cholesterol Other    Stroke Other           No Known Allergies  acetaminophen (TYLENOL) tablet, 500 mg, Oral, Q4H PRN  amLODIPine (NORVASC) tablet, 5 mg, Oral, Daily  atorvastatin (LIPITOR) tablet, 80 mg, Oral, QPM  ceFEPime (MAXIPIME) 2g in NS 144mL IVPB minibag, 2 g, Intravenous, Q12H  cholecalciferol (VITAMIN D3) 1000 unit (25 mcg) tablet, 1,000 Units, Oral, Daily  docusate sodium (COLACE) capsule, 100 mg, Oral, 2x/day PRN  elvitegravir-cobicistat-emtricitrabine-tenofovir (GENVOYA) 150-150-200-10 mg per tablet, 1 Tablet, Oral, Daily  enoxaparin PF (LOVENOX) 40 mg/0.4 mL SubQ injection, 40 mg, Subcutaneous, Q24H  gabapentin (NEURONTIN) capsule, 300 mg, Oral, 3x/day  HYDROmorphone (DILAUDID) 1 mg/mL injection, 2 mg,  Intravenous, Now  HYDROmorphone (DILAUDID) 1 mg/mL injection, 2 mg, Intravenous, Q4H PRN  ipratropium-albuterol 0.5 mg-3 mg(2.5 mg base)/3 mL Solution for Nebulization, 3 mL, Nebulization, Q4H PRN  levothyroxine (SYNTHROID) tablet, 88 mcg, Oral, QAM  LR premix infusion, , Intravenous, Continuous  magnesium hydroxide (MILK OF MAGNESIA) 400mg  per 57mL oral liquid, 15 mL, Oral, Daily PRN  melatonin tablet, 10.5 mg, Oral, HS PRN  NS flush syringe, 3 mL, Intracatheter, Q8HRS  NS flush syringe, 3 mL, Intracatheter, Q1H PRN  NS flush syringe, 3 mL, Intracatheter, Q8HRS  NS flush syringe, 3 mL, Intracatheter, Q1H PRN  ondansetron (ZOFRAN) 2 mg/mL injection, 4 mg, Intravenous, Q6H PRN  oxyCODONE-acetaminophen 10-325mg  per tablet, 1 Tablet, Oral, Q8H PRN  pantoprazole (PROTONIX) delayed release tablet, 40 mg, Oral, Daily  perflutren lipid microspheres (DEFINITY) 1.3 mL in NS 10 mL (tot vol) injection, 2 mL, Intravenous, Give in Cardiology  tamsulosin (FLOMAX) capsule, 0.4 mg, Oral, 2x/day  traMADol (ULTRAM) tablet, 50 mg, Oral, Q6H PRN  vancomycin (VANCOCIN) 1,250 mg in NS 250 mL IVPB, 15 mg/kg (Adjusted), Intravenous, Q12H  Vancomycin IV - Pharmacist to Dose per Protocol, , Does not apply, Daily PRN          Social History:  Social History     Socioeconomic History   . Marital status: Married     Spouse name: Not on file   . Number of children: Not on file   . Years of education: Not on file   . Highest education level: Not on file   Occupational History   . Not on file   Tobacco Use   . Smoking status: Never   . Smokeless tobacco: Never   Vaping Use   . Vaping Use: Never used   Substance and Sexual Activity   . Alcohol use: Not Currently   . Drug use: Never   . Sexual activity: Not on file   Other Topics Concern   . Calcium intake adequate Not Asked   . Computer Use Not Asked   . Drives Not Asked   . Exercise Concern Not Asked   . Helmet Use Not Asked   . Seat Belt Not Asked   . Uses Cane Not Asked   . Uses walker Not Asked    . Uses wheelchair Not Asked   . Right hand dominant Not Asked   . Left hand dominant Not Asked   . Ambidextrous Not Asked   . Uses Scooter Not Asked   . Activity Limitations Not Asked   . Ability to Walk 1 Flight of Steps without SOB/CP Yes   . Routine Exercise Not Asked   . Ability to Walk 2 Flight of Steps without SOB/CP Yes   . Unable to Ambulate Not Asked   . Total Care No   .  Ability To Do Own ADL's Yes   . Uses Walker No   . Other Activity Level Not Asked   . Uses Cane No   Social History Narrative   . Not on file     Social Determinants of Health     Financial Resource Strain: Not on file   Food Insecurity: Not on file   Transportation Needs: Not on file   Physical Activity: Not on file   Stress: Not on file   Intimate Partner Violence: Not on file   Housing Stability: Not on file        ROS:   14 point review of systems was performed, positive and negatives as above else otherwise negative    EXAM:  BP 115/88   Pulse (!) 108   Temp 36.9 C (98.4 F)   Resp 16   Ht 1.803 m (5\' 11" )   Wt 80.1 kg (176 lb 9.6 oz)   SpO2 94%   BMI 24.63 kg/m       General:   75 y.o. male appearing stated age.  Well appearing.  No acute distress.  Head:  Normocephalic and atraumatic.  ENT:  Membranes are moist.  Oropharynx free of erythema, exudates and thrush.  Neck:  Supple with full range of motion trachea is midline. No lymphadenopathy.  Respiratory:  Clear to auscultation bilaterally. No wheezes, rales or rhonchi.  Cardiovascular:  Regular rate and rhythm. No murmurs, rubs or gallops.    Abdomen:  Soft, nontender and nondistended.  Bowel sounds x4.    Extremities:  2+ pedal and radial pulses palpated.  No clubbing, cyanosis or edema.   Musculoskeletal :  No rash        Labs:    Recent Labs     01/03/21  0531 01/04/21  0500 01/05/21  0425   WBC 33.0* 20.3* 16.0*   HGB 14.6 13.5 14.3   HCT 44.3 41.1 43.2   PLTCNT 147* 134* 150     Recent Labs     01/03/21  0531 01/04/21  0500 01/05/21  0425   PMNS 96 87 88    LYMPHOCYTES 2  --   --    MONOCYTES 2 6 4    EOSINOPHIL 0  --   --    BASOPHILS 0  <0.10 0  <0.10 0  <0.10   PMNABS 31.68* 17.47* 13.96*   LYMPHSABS  --  1.28 1.14   MONOSABS 0.66 1.27* 0.71   EOSABS <0.10 <0.10 <0.10     Recent Labs     03/24/20  1536 03/25/20  0443 04/19/20  1001 04/20/20  0525 04/22/20  1426 04/23/20  0452 01/02/21  0934 01/02/21  0935 01/03/21  0531 01/04/21  0500 01/05/21  0425   NA  --    < >  --    < >  --    < >  --  135* 133* 133* 136   K  --    < >  --    < >  --    < >  --  4.3 4.6 4.3 4.2   KET Not Detected  --  Not Detected  --   --   --  Not Detected  --   --   --   --    CL  --    < >  --    < >  --    < >  --  106 103 103 103   CLARITY  --   --   --   --  Cloudy  --   --   --   --   --   --    CLRFLD  --   --   --   --  Red  --   --   --   --   --   --    CO2  --    < >  --    < >  --    < >  --  20* 22* 25 24   BUN  --    < >  --    < >  --    < >  --  31* 23 20 18    CREA  --    < >  --    < >  --    < >  --  1.17 0.92 0.91 0.94   ALKP  --    < >  --   --   --    < >  --  76 75 70  --    SGOT  --    < >  --   --   --    < >  --  16 11 9   --     < > = values in this interval not displayed.     Recent Labs     01/03/21  0531 01/04/21  0500 01/05/21  0425   CALCIUM 8.6* 8.2* 8.5*   ALBUMIN 3.0* 2.6*  --    MAGNESIUM 2.3 2.1 2.2     Recent Labs     01/03/21  0531 01/04/21  0500   TOTALPROTEIN 5.6* 5.2*   ALBUMIN 3.0* 2.6*   AST 11 9   ALT 28 22   ALKPHOS 75 39       Microbiology:   Hospital Encounter on 01/02/21 (from the past 96 hour(s))   ADULT ROUTINE BLOOD CULTURE, SET OF 2 ADULT BOTTLES (BACTERIA AND YEAST)    Collection Time: 01/02/21 10:30 AM    Specimen: Blood   Culture Result Status    BLOOD CULTURE, ROUTINE Methicillin Resistant Staphylococcus aureus (A) Final    GRAM STAIN Gram Positive Cocci/Clusters (AA) Final       Susceptibility    Methicillin Resistant Staphylococcus aureus -  (no method available)     Penicillin >=0.5 Resistant      Oxacillin >=4 Resistant       Gentamicin <=0.5 Sensitive      Ciprofloxacin >=8 Resistant      Levofloxacin 4 Resistant      Inducible Clindamycin Resistance Neg       Erythromycin >=8 Resistant      Clindamycin <=0.25 Sensitive      Linezolid 2 Sensitive      Vancomycin 1 Sensitive      Tetracycline <=1 Sensitive      Rifampin <=0.5 Sensitive      Trimethoprim/Sulfamethoxazole <=10 Sensitive    ADULT ROUTINE BLOOD CULTURE, SET OF 2 ADULT BOTTLES (BACTERIA AND YEAST)    Collection Time: 01/02/21 10:30 AM    Specimen: Blood   Culture Result Status    BLOOD CULTURE, ROUTINE Methicillin Resistant Staphylococcus aureus (A) Final    GRAM STAIN Gram Positive Cocci/Clusters (AA) Final   WOUND, SUPERFICIAL/NON-STERILE SITE, AEROBIC CULTURE AND GRAM STAIN    Collection Time: 01/03/21 11:29 AM    Specimen: Wound; Other   Culture Result Status    WOUND CULTURE Methicillin Resistant Staphylococcus aureus (A) Final    GRAM STAIN 1+  Rare PMNs Final    GRAM STAIN No Organisms Seen Final       Susceptibility    Methicillin Resistant Staphylococcus aureus -  (no method available)     Penicillin >=0.5 Resistant      Oxacillin >=4 Resistant      Gentamicin <=0.5 Sensitive      Ciprofloxacin >=8 Resistant      Levofloxacin 4 Resistant      Inducible Clindamycin Resistance Neg       Erythromycin >=8 Resistant      Clindamycin >=8 Resistant      Linezolid 2 Sensitive      Vancomycin <=0.5 Sensitive      Tetracycline <=1 Sensitive      Rifampin <=0.5 Sensitive      Trimethoprim/Sulfamethoxazole <=10 Sensitive    ANAEROBIC CULTURE    Collection Time: 01/03/21 11:29 AM    Specimen: Wound; Other   Culture Result Status    ANAEROBIC CULTURE No Anaerobes Isolated Final       Imaging Studies:   Results for orders placed or performed during the hospital encounter of 01/02/21   CT ABDOMEN PELVIS W IV CONTRAST     Status: None    Narrative    Male, 75 years old.    CT ABDOMEN PELVIS W IV CONTRAST performed on 01/02/2021 11:19 AM.    REASON FOR EXAM:  llq abd pain  RADIATION  DOSE: 1271 DLP  CONTRAST: 100 ml's of Isovue 370    TECHNIQUE: Intravenous contrast utilized for study. Volumetric acquisition of the abdomen and pelvis. This CT scanner is equipped with dose reducing technology. The exposure is automatically adjusted according to patient body size in order to deliver the lowest dose possible.    COMPARISON: CT abdomen pelvis of 04/19/2020.    FINDINGS: Included lung bases are without acute abnormality.    Multiple low-density hepatic lesions, likely representing hepatic cysts. Again noted large heterogeneously enhancing pancreatic tail mass now measuring up to 7.4 cm in diameter. This mass abuts the anterior aspect of the splenic vein with findings highly suggestive of intraluminal involvement. This mass also abuts the anterior aspect of the splenic artery. Multiple additional low-density pancreatic lesions. Spleen and adrenal glands are unremarkable.    Kidneys opacify symmetrically and homogeneously. Multiple low-density lesions, most of which are subcentimeter in diameter. These likely represent renal cysts although the subcentimeter lesions are too small to fully characterize. No hydronephrosis and the ureters of normal caliber. Bladder is adequately distended without evidence of asymmetric wall thickening. Brachytherapy seeds within the prostate.    Stomach is decompressed. Nonvisualization of the appendix. Colonic diverticulosis. No evidence mechanical bowel obstruction.    No intraperitoneal free air or free fluid. Abdominal aorta is of normal caliber with scattered calcified plaque. Portal and hepatic veins are patent. Nonspecific subcutaneous soft tissue stranding within left superficial inguinal region. Fat-containing left-sided inguinal hernia. No acute fracture or traumatic malalignment.      Impression    1.Nonspecific subcutaneous soft tissue stranding involving the left superficial inguinal region. Findings may relate to nonspecific edema/cellulitis.  2.Increased size  of a large heterogeneously enhancing pancreatic tail mass with findings highly concerning for direct invasion into the splenic vein.  3.Colonic diverticulosis.            Radiologist location ID: [id number]       Case discussed with attending physician    Rudean Haskell, MD

## 2021-01-05 NOTE — Care Plan (Signed)
Awake laying in bed. Rested on and off overnight. No change in neuro assessment. Telemetry intact and active. LR/antibiotics given as ordered. PRN pain medication given X2 this shift. Denies pain or any other needs at this time. Encouraged to make all needs known as they arise. All safety maintained. Will continue to monitor.

## 2021-01-05 NOTE — Care Plan (Signed)
Patient placed in contact isolation for MRSA in blood and wound cultures. Repeat blood cultures drawn. Cefepime discontinued. Dressing to left groin changed. No purulent drainage noted.

## 2021-01-05 NOTE — Consults (Signed)
HEMATOLOGY/ONCOLOGY CONSULT  Initial Note      Name: [name], [name]  Date of Admission:  01/02/2021  Date of Birth:  [birth year]  MRN: [id number]    Date of Service: 01/05/2021    Requesting MD: Tod Persia, MD    Primary Care Provider: [facility]    Chief Complaint: groin lesion     HPI/Discussion: [name] is a 75 y.o. year old White male with PMH HIV, HTN, HLD, prostate cancer, pancreatic mass, hypothyroidism is admitted for abscess of the groin that is improving at the time of this note. He was scheduled to have EUS of known pancreatic mass (since 2001) 01/04/21 which he missed due to this hospitalization. He has been seen by GI for his mass and referred to [facility] as appropriate for the EUS and biopsy. This admission, team has spoken with [place] who did not feel that he should be IP transfer for this issue but rather OP.     Hem/onc consultation for pancreatic mass , CT concern for splenic invasion.  Upon my evaluation , patient is a good historian, no distress, recounts his plan with Dr. Cyndi Bender to follow up OP in regard to the pancreatic mass. Abdomen is non-tender. He has dressing to pubic area lesion, yellow drainage.     WBC 16.0, Hgb 14.3, platelets 150,000, ANC 13.9, AST/ALT WNL.   Wound culture shows MRSA.       01/02/21 IMPRESSION:  1.Nonspecific subcutaneous soft tissue stranding involving the left superficial inguinal region. Findings may relate to nonspecific edema/cellulitis.  2.Increased size of a large heterogeneously enhancing pancreatic tail mass with findings highly concerning for direct invasion into the splenic vein.  3.Colonic diverticulosis.    Patient is known to Dr. Deforest Hoyles , last seen in the office 12/11/20   Oncology History    No history exists.        Past Medical History  Past Medical History:   Diagnosis Date   . Cancer (CMS [facility])     prostate   . Deep vein thrombosis (DVT) (CMS HCC)     right leg   . Esophageal reflux    . H/O hearing loss    . High cholesterol     . 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 HTN (hypertension)    . Hyperlipidemia     "borderline"   . Hypothyroidism    . MRSA (methicillin resistant staph aureus) culture positive 01/02/2021    MRSA left groin abscess 01/03/21   . MRSA (methicillin resistant staph aureus) culture positive 01/02/2021    MRSA blood 01/02/21         Home Medications  Prior to Admission medications    Medication Sig Start Date End Date Taking? Authorizing Provider   amlodipine besylate (AMLODIPINE ORAL) Take 5 mg by mouth Once a day   Yes Provider, Historical   atorvastatin (LIPITOR) 80 mg Oral Tablet Take 1 Tablet (80 mg total) by mouth Every evening Take one half pill every morning with breakfast (40 mg)   Yes Provider, Historical   cholecalciferol, vitamin D3, 25 mcg (1,000 unit) Oral Tablet Take 1,000 Units by mouth Once a day    Provider, Historical   docusate sodium (COLACE) 100 mg Oral Capsule Take 1 Capsule (100 mg total) by mouth Twice per day as needed   Yes Provider, Historical   elviteg-cob-emtri-tenof ALAFEN (GENVOYA) 150-150-200-10 mg Oral Tablet 1 Tablet Once a  day With the evening meal   Yes Provider, Historical   gabapentin (NEURONTIN) 300 mg Oral Capsule Take 1 Tab by mouth Three times a day   Yes Provider, Historical   levothyroxine (SYNTHROID) 88 mcg Oral Tablet Take 88 mcg by mouth Every morning   Yes Provider, Historical   melatonin 10 mg Oral Capsule Take by mouth Every night   Yes Provider, Historical   omega-3-DHA-EPA-fish oil 1,000 mg (120 mg-180 mg) Oral Capsule Take by mouth Once a day    Provider, Historical   omeprazole (PRILOSEC) 20 mg Oral Capsule, Delayed Release(E.C.) Take 20 mg by mouth Twice daily   Yes Provider, Historical   pantoprazole (PROTONIX) 40 mg Oral Tablet, Delayed Release (E.C.) Take 1 Tablet (40 mg total) by mouth Once a day   Yes Provider, Historical   tamsulosin (FLOMAX) 0.4 mg Oral Capsule Take 1 Capsule (0.4 mg total) by mouth Twice daily 02/27/19   Yes Provider, Historical   HYDROcodone-acetaminophen ([place]) 5-325 mg Oral Tablet Take 1 Tablet by mouth Every 4 hours as needed 04/28/20 01/02/21  Paulina Fusi, MD     Current Inpatient Medications  Current Facility-Administered Medications   Medication Dose Route Frequency   . acetaminophen (TYLENOL) tablet  500 mg Oral Q4H PRN   . amLODIPine (NORVASC) tablet  5 mg Oral Daily   . atorvastatin (LIPITOR) tablet  80 mg Oral QPM   . ceFEPime (MAXIPIME) 2g in NS 136mL IVPB minibag  2 g Intravenous Q12H   . cholecalciferol (VITAMIN D3) 1000 unit (25 mcg) tablet  1,000 Units Oral Daily   . docusate sodium (COLACE) capsule  100 mg Oral 2x/day PRN   . elvitegravir-cobicistat-emtricitrabine-tenofovir (GENVOYA) 150-150-200-10 mg per tablet  1 Tablet Oral Daily   . enoxaparin PF (LOVENOX) 40 mg/0.4 mL SubQ injection  40 mg Subcutaneous Q24H   . gabapentin (NEURONTIN) capsule  300 mg Oral 3x/day   . HYDROmorphone (DILAUDID) 1 mg/mL injection  2 mg Intravenous Now   . HYDROmorphone (DILAUDID) 1 mg/mL injection  2 mg Intravenous Q4H PRN   . ipratropium-albuterol 0.5 mg-3 mg(2.5 mg base)/3 mL Solution for Nebulization  3 mL Nebulization Q4H PRN   . levothyroxine (SYNTHROID) tablet  88 mcg Oral QAM   . LR premix infusion   Intravenous Continuous   . magnesium hydroxide (MILK OF MAGNESIA) 400mg  per 27mL oral liquid  15 mL Oral Daily PRN   . melatonin tablet  10.5 mg Oral HS PRN   . NS flush syringe  3 mL Intracatheter Q8HRS   . NS flush syringe  3 mL Intracatheter Q1H PRN   . NS flush syringe  3 mL Intracatheter Q8HRS   . NS flush syringe  3 mL Intracatheter Q1H PRN   . ondansetron (ZOFRAN) 2 mg/mL injection  4 mg Intravenous Q6H PRN   . oxyCODONE-acetaminophen 10-325mg  per tablet  1 Tablet Oral Q8H PRN   . pantoprazole (PROTONIX) delayed release tablet  40 mg Oral Daily   . perflutren lipid microspheres (DEFINITY) 1.3 mL in NS 10 mL (tot vol) injection  2 mL Intravenous Give in Cardiology   . tamsulosin (FLOMAX) capsule  0.4 mg  Oral 2x/day   . traMADol (ULTRAM) tablet  50 mg Oral Q6H PRN   . vancomycin (VANCOCIN) 1,250 mg in NS 250 mL IVPB  15 mg/kg (Adjusted) Intravenous Q12H   . Vancomycin IV - Pharmacist to Dose per Protocol   Does not apply Daily PRN     Allergies:  No Known Allergies  Past Surgical History  Past Surgical History:   Procedure Laterality Date   . COLON SURGERY     . 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 Parke Simmers   . HX HERNIA REPAIR     . IRRIGATION AND DEBRIDEMENT HIP Right 04/22/2020    Performed by Glennon Hamilton, MD at [place]   . WRIST SURGERY Right      Family History  Family Medical History:     Problem Relation (Age of Onset)    Cancer Mother, Father, Other    Heart Attack Mother    High Cholesterol Other    Stroke Other          Social History  Social History     Tobacco Use   . Smoking status: Never   . Smokeless tobacco: Never   Vaping Use   . Vaping Use: Never used   Substance Use Topics   . Alcohol use: Not Currently   . Drug use: Never        REVIEW OF SYSTEMS: A 12 point review of systems was carried.  All negative except as noted in HPI.    EXAM  Vitals: BP (!) 154/85   Pulse 76   Temp 36.9 C (98.4 F)   Resp 18   Ht 1.803 m (5\' 11" )   Wt 80.1 kg (176 lb 9.6 oz)   SpO2 95%   BMI 24.63 kg/m       Head:  Atraumatic  Neck:  Supple;Respiratory:  nonlabored effort, no distress.    Cardiovascular:  Pulse regular  Abdomen:  Soft, non-tender  Extremities:  No edema  Integumentary:  Generalized bruising to the arms, wound to pubic area.   Neurologic:  Patient oriented x3, moving all four extremities.        Labs:   CBC  Recent Labs     01/03/21  0531 01/04/21  0500 01/05/21  0425   WBC 33.0* 20.3* 16.0*   HGB 14.6 13.5 14.3   HCT 44.3 41.1 43.2   PLTCNT 147* 134* 150     Chemistries  Recent Labs     01/03/21  0531 01/04/21  0500 01/05/21  0425   SODIUM 133* 133* 136   POTASSIUM 4.6 4.3 4.2   CHLORIDE 103 103 103   CO2 22* 25 24    BUN 23 20 18    CREATININE 0.92 0.91 0.94   CALCIUM 8.6* 8.2* 8.5*   ALBUMIN 3.0* 2.6*  --    MAGNESIUM 2.3 2.1 2.2     Liver enzymes  Recent Labs     01/03/21  0531 01/04/21  0500   TOTALPROTEIN 5.6* 5.2*   ALBUMIN 3.0* 2.6*   AST 11 9   ALT 28 22   ALKPHOS 75 70     Cardiac & coags  No results for input(s): UHCEASTTROPI, BNP, INR in the last 72 hours.    Invalid input(s): PTT, PT    Pathology:  No results found for this visit on 01/02/21.    Diagnostic Imaging:  CT ABDOMEN PELVIS W IV CONTRAST   Final Result   1.Nonspecific subcutaneous soft tissue stranding involving the left superficial inguinal region. Findings may relate to nonspecific edema/cellulitis.   2.Increased size of a large heterogeneously enhancing pancreatic tail mass with findings highly concerning for direct invasion into the splenic vein.   3.Colonic diverticulosis.  Radiologist location ID: [id number]              Impression/plan: 75 year old male with complicated medical history, including HIV, HTN, HLD, prostate cancer (tx with RT 2017, controlled),  hypothyroidism and known pancreatic mass which was to be worked up with EUS per Dr. Cyndi Bender at [facility] as OP - patient admitted with MRSA infection skin. CT abdomen shows concern for splenic mass invasion, however MRI from 12/15/20 does not show any obvious invasion and is likely more representative/accurate. I communicated with Dr. Cyndi Bender at [facility] who is familiar with his case and plans to follow him with EUS as OP; Dr. Cyndi Bender reviewed the films, deemed okay to follow this OP , no need to IP intervention or transfer.     1. PANCREATIC MASS:    Patient should reschedule the EUS and follow up with Dr. Cyndi Bender upon discharge.    Patient agreed that he can see oncology at [facility] as indicated and will call us for follow up if desired.      2. HISTORY DVT    continue apixiaban, frequent ambulation.     3. PROSTATE CANCER:    controlled post RT - PSA stable.    Patient shall follow up with  primary provider for surveillance.        Patient agreed that he can see oncology at [facility] as indicated and will call us for follow up if desired.            Patient seen and examined with Dr. Deforest Hoyles, plans formulated together. Marchia Meiers, CNP  01/05/2021, 14:26     I have personally performed the services and agree with the above assessment and plan.   Milinda Cave, MD  01/05/2021, 16:16

## 2021-01-06 DIAGNOSIS — D72829 Elevated white blood cell count, unspecified: Secondary | ICD-10-CM

## 2021-01-06 DIAGNOSIS — K8689 Other specified diseases of pancreas: Secondary | ICD-10-CM

## 2021-01-06 DIAGNOSIS — Z8546 Personal history of malignant neoplasm of prostate: Secondary | ICD-10-CM

## 2021-01-06 DIAGNOSIS — R21 Rash and other nonspecific skin eruption: Secondary | ICD-10-CM

## 2021-01-06 DIAGNOSIS — L0390 Cellulitis, unspecified: Secondary | ICD-10-CM

## 2021-01-06 DIAGNOSIS — Z79899 Other long term (current) drug therapy: Secondary | ICD-10-CM

## 2021-01-06 LAB — CBC
HCT: 44.1 % (ref 38.9–52.0)
HGB: 14.4 g/dL (ref 13.4–17.5)
MCH: 30.3 pg (ref 26.0–32.0)
MCHC: 32.7 g/dL (ref 31.0–35.5)
MCV: 92.8 fL (ref 78.0–100.0)
MPV: 10.8 fL (ref 8.7–12.5)
PLATELETS: 162 10*3/uL (ref 150–400)
RBC: 4.75 10*6/uL (ref 4.50–6.10)
RDW-CV: 13.9 % (ref 11.5–15.5)
WBC: 13.5 10*3/uL — ABNORMAL HIGH (ref 3.7–11.0)

## 2021-01-06 LAB — BASIC METABOLIC PANEL
ANION GAP: 9 mmol/L (ref 4–13)
BUN/CREA RATIO: 22 (ref 6–22)
BUN: 20 mg/dL (ref 8–25)
CALCIUM: 9 mg/dL (ref 8.8–10.2)
CHLORIDE: 102 mmol/L (ref 96–111)
CO2 TOTAL: 25 mmol/L (ref 23–31)
CREATININE: 0.93 mg/dL (ref 0.75–1.35)
ESTIMATED GFR: 86 mL/min/BSA (ref >=60)
GLUCOSE: 180 mg/dL — ABNORMAL HIGH (ref 65–125)
POTASSIUM: 4.3 mmol/L (ref 3.5–5.1)
SODIUM: 136 mmol/L (ref 136–145)

## 2021-01-06 LAB — MAGNESIUM: MAGNESIUM: 2.1 mg/dL (ref 1.8–2.6)

## 2021-01-06 LAB — VANCOMYCIN, TROUGH: VANCOMYCIN TROUGH: 13.1 ug/mL (ref 10.0–20.0)

## 2021-01-06 NOTE — Progress Notes (Signed)
[facility]  Surgical  Progress  Note      Patient Name:  [name]  Age:  75 y.o.  DOB:  [birth year]  MRN:  [id number]  Date:  01/06/2021      Note:          The patient has no new complaints.  The patient remains afebrile his hemodynamics are satisfactory.  Left groin site appears to be healing nicely.  Overall it appears he is improving with an improving white blood cell count and no fever etcetera.  At this point would recommend continued local care and healing by secondary intention at the incision and drainage site.  For any worsening white blood cell count, new onset fever etcetera than certainly repeat CT imaging would be warranted.  I will sign off for now.  I can see him back in outpatient follow-up after his discharge from hospital to follow-up the I&D site.    I am not in continual contact with the Epic system.  If you have any questions or concerns regarding this patient,  please utilize my pager to contact me with your contact number and I will call you back directly.  Please do not  use the Epic chat system to contact me at any time.      Drue Novel, MD      This note was created using the [facility] Fluency Direct system and there may be some incorrect words, spelling and/or punctuation not noted/corrected prior to signature

## 2021-01-06 NOTE — Pharmacy (Signed)
Pharmacy Department               Therapeutic Drug Monitoring: Vancomycin Trough-based Dosing               01/06/2021    Patient Name: [name]   Date of Birth: [birth year]   MRN: [id number]   CSN: [id number]    Vancomycin Indication: skin/soft tissue  Day # 5 of Therapy  Provider Ordering Vancomycin Pharmacist Management: Meta Hatchet  Goal Trough: 10-15 mcg/mL OR 13-17 mcg/mL    Current Vancomycin Regimen: 1250 mg IVPB every 12 hours.    Measured Vancomycin Serum Trough Level: 13.1 mcg/mL (12/7 at 11:34; true trough = 12.8)    [name]'s current vancomycin regimen will be continued.    The next Vancomycin serum trough level will be drawn before the 12/10 12:00 dose.     [name], [facility]  01/06/2021  13:29    The Medical Executive Committee at [facility] has granted pharmacists via protocol order the ability to manage vancomycin therapy in pediatric and adult patients in accordance with the Vancomycin Dosing Protocol.  Pharmacist responsibilities will include ordering and changing doses, ordering serum concentration levels, and other relevant labs.  The decision to discontinue vancomycin therapy altogether will be determined by the primary service.  *Creatinine clearance is estimated by using the Cockcroft-Gault equation for adult patients.

## 2021-01-06 NOTE — Nurses Notes (Signed)
Pt is up and ambulating in the hallway. Alert/oriented and very pleasant. Mayra Neer, RN  01/06/2021, 21:59

## 2021-01-06 NOTE — Nurses Notes (Signed)
Dressing change to left groin completed. medicated with dilaudid prior to, pt tolerated well.

## 2021-01-06 NOTE — Progress Notes (Addendum)
[facility]                                     HOSPITALIST PROGRESS NOTE      Donavyn, Fecher, 75 y.o. male  Date of Admission:  01/02/2021  Date of service: 01/06/2021  Date of Birth:  October 10, 1945    Hospital Day:  LOS: 4 days     Interval History:   [name] Kuzel Montgomeryis a 75 y.o.,[place]item[place]ewith PMH significant for HIV, HTN, HLD, prostate cancer, hernia repair, known pancreatic mass scheduled for EUS with biopsy in [facility] Monday 01-04-21, hypothyroidism.[name]  Patient presented to ER with concerns of a left groin skin lesion. He reported he had a wart beside penis on left side frozen off by dermatologyabout 1 week ago. States2 days agohe felt a "popping" sensation in left groin areaand now area has become more swollen and painful.CT scan also noted enlarging pancreatic mass with findings concerning for direct invasion into the splenic vein    01/05/2021:  No acute issues overnight, his MRSA bacteremia, echo done and has not resulted yet.    01/06/2021:  White blood cell count trending down, patient feeling better, no fever.  Echocardiogram resulted and showing no valvular abnormality.      Vital Signs:  Temperature: 36.7 C (98.1 F)  Heart Rate: 98  BP (Non-Invasive): 126/88  Respiratory Rate: 17  SpO2: 96 %  Liter Flow (L/Min):      Intake & Output:    Intake/Output Summary (Last 24 hours) at 01/06/2021 1510  Last data filed at 01/06/2021 1335  Gross per 24 hour   Intake 1300 ml   Output 1700 ml   Net -400 ml     I/O current shift:  12/07 0700 - 12/07 1859  In: 80 [P.O.:580]  Out: 300 [Urine:300]  Emesis:    BM:    Date of Last Bowel Movement: 01/06/21  Heme:      Physical Exam  Vitals and nursing note reviewed.   Constitutional:       General: He is not in acute distress.     Appearance: Normal appearance. He is not ill-appearing.   HENT:      Head: Normocephalic and atraumatic.      Mouth/Throat:      Mouth: Mucous membranes are moist.      Pharynx: Oropharynx is clear.    Cardiovascular:      Rate and Rhythm: Normal rate and regular rhythm.      Heart sounds: No murmur heard.  Pulmonary:      Effort: Pulmonary effort is normal.      Breath sounds: Normal breath sounds. No wheezing, rhonchi or rales.   Abdominal:      General: Abdomen is flat. There is no distension.      Palpations: Abdomen is soft.      Tenderness: There is no abdominal tenderness.      Hernia: No hernia is present.   Genitourinary:     Comments: Left groin packed wound  Musculoskeletal:         General: No swelling. Normal range of motion.      Right lower leg: No edema.      Left lower leg: No edema.   Skin:     General: Skin is warm and dry.      Capillary Refill: Capillary refill takes less than 2 seconds.  Findings: No bruising or erythema.   Neurological:      General: No focal deficit present.      Mental Status: He is alert and oriented to person, place, and time. Mental status is at baseline.   Psychiatric:         Mood and Affect: Mood normal.         Behavior: Behavior normal.         Judgment: Judgment normal.           Current Medications:  acetaminophen (TYLENOL) tablet, 500 mg, Oral, Q4H PRN  amLODIPine (NORVASC) tablet, 5 mg, Oral, Daily  atorvastatin (LIPITOR) tablet, 80 mg, Oral, QPM  cholecalciferol (VITAMIN D3) 1000 unit (25 mcg) tablet, 1,000 Units, Oral, Daily  docusate sodium (COLACE) capsule, 100 mg, Oral, 2x/day PRN  elvitegravir-cobicistat-emtricitrabine-tenofovir (GENVOYA) 150-150-200-10 mg per tablet, 1 Tablet, Oral, Daily  enoxaparin PF (LOVENOX) 40 mg/0.4 mL SubQ injection, 40 mg, Subcutaneous, Q24H  gabapentin (NEURONTIN) capsule, 300 mg, Oral, 3x/day  HYDROmorphone (DILAUDID) 1 mg/mL injection, 2 mg, Intravenous, Now  HYDROmorphone (DILAUDID) 1 mg/mL injection, 2 mg, Intravenous, Q4H PRN  ipratropium-albuterol 0.5 mg-3 mg(2.5 mg base)/3 mL Solution for Nebulization, 3 mL, Nebulization, Q4H PRN  levothyroxine (SYNTHROID) tablet, 88 mcg, Oral, QAM  magnesium hydroxide (MILK OF  MAGNESIA) 400mg  per 40mL oral liquid, 15 mL, Oral, Daily PRN  melatonin tablet, 10.5 mg, Oral, HS PRN  NS flush syringe, 3 mL, Intracatheter, Q8HRS  NS flush syringe, 3 mL, Intracatheter, Q1H PRN  ondansetron (ZOFRAN) 2 mg/mL injection, 4 mg, Intravenous, Q6H PRN  oxyCODONE-acetaminophen 10-325mg  per tablet, 1 Tablet, Oral, Q8H PRN  pantoprazole (PROTONIX) delayed release tablet, 40 mg, Oral, Daily  perflutren lipid microspheres (DEFINITY) 1.3 mL in NS 10 mL (tot vol) injection, 2 mL, Intravenous, Give in Cardiology  tamsulosin (FLOMAX) capsule, 0.4 mg, Oral, 2x/day  traMADol (ULTRAM) tablet, 50 mg, Oral, Q6H PRN  vancomycin (VANCOCIN) 1,250 mg in NS 250 mL IVPB, 15 mg/kg (Adjusted), Intravenous, Q12H  Vancomycin IV - Pharmacist to Dose per Protocol, , Does not apply, Daily PRN        No Known Allergies    Labs:  I have reviewed all lab results.    Labs:         CBC Results Differential Results   Recent Labs     01/04/21  0500 01/05/21  0425 01/06/21  0401   WBC 20.3* 16.0* 13.5*   HGB 13.5 14.3 14.4   HCT 41.1 43.2 44.1    Recent Labs     01/04/21  0500 01/05/21  0425   PMNS 87 88   MONOCYTES 6 4   BASOPHILS 0   <0.10 0   <0.10   PMNABS 17.47* 13.96*   LYMPHSABS 1.28 1.14   MONOSABS 1.27* 0.71   EOSABS <0.10 <0.10      BMP Results Other Chemistries Results   Recent Labs     01/04/21  0500 01/05/21  0425 01/06/21  0401   SODIUM 133* 136 136   POTASSIUM 4.3 4.2 4.3   CHLORIDE 103 103 102   CO2 25 24 25    BUN 20 18 20    CREATININE 0.91 0.94 0.93   GFR 88 85 86   ANIONGAP 5 9 9     Recent Labs     01/04/21  0500 01/05/21  0425 01/06/21  0401   CALCIUM 8.2* 8.5* 9.0   ALBUMIN 2.6*  --   --    MAGNESIUM 2.1 2.2 2.1  Liver/Pancreas Enzyme Results Blood Gas     Recent Labs     01/04/21  0500   TOTALPROTEIN 5.2*   ALBUMIN 2.6*   AST 9   ALT 22   ALKPHOS 70    No results found for this encounter   Cardiac Results    Coags Results     No results for input(s): UHCEASTTROPI, CKMB, MBINDEX, BNP in the last 72 hours. No results  for input(s): INR in the last 72 hours.    Invalid input(s): PTT, PT     Hospital Encounter on 01/02/21 (from the past 96 hour(s))   WOUND, SUPERFICIAL/NON-STERILE SITE, AEROBIC CULTURE AND GRAM STAIN    Collection Time: 01/03/21 11:29 AM    Specimen: Wound; Other   Culture Result Status    WOUND CULTURE Methicillin Resistant Staphylococcus aureus (A) Final    GRAM STAIN 1+ Rare PMNs Final    GRAM STAIN No Organisms Seen Final       Susceptibility    Methicillin Resistant Staphylococcus aureus -  (no method available)     Penicillin >=0.5 Resistant      Oxacillin >=4 Resistant      Gentamicin <=0.5 Sensitive      Ciprofloxacin >=8 Resistant      Levofloxacin 4 Resistant      Inducible Clindamycin Resistance Neg       Erythromycin >=8 Resistant      Clindamycin >=8 Resistant      Linezolid 2 Sensitive      Vancomycin <=0.5 Sensitive      Tetracycline <=1 Sensitive      Rifampin <=0.5 Sensitive      Trimethoprim/Sulfamethoxazole <=10 Sensitive    ANAEROBIC CULTURE    Collection Time: 01/03/21 11:29 AM    Specimen: Wound; Other   Culture Result Status    ANAEROBIC CULTURE No Anaerobes Isolated Final   ADULT ROUTINE BLOOD CULTURE, SET OF 2 ADULT BOTTLES (BACTERIA AND YEAST)    Collection Time: 01/05/21  1:47 PM    Specimen: Blood   Culture Result Status    BLOOD CULTURE, ROUTINE No Growth 18-24 hrs. Preliminary   ADULT ROUTINE BLOOD CULTURE, SET OF 2 ADULT BOTTLES (BACTERIA AND YEAST)    Collection Time: 01/05/21  1:47 PM    Specimen: Blood   Culture Result Status    BLOOD CULTURE, ROUTINE No Growth 18-24 hrs. Preliminary       Radiology Results:  CT ABDOMEN PELVIS W IV CONTRAST   Final Result   1.Nonspecific subcutaneous soft tissue stranding involving the left superficial inguinal region. Findings may relate to nonspecific edema/cellulitis.   2.Increased size of a large heterogeneously enhancing pancreatic tail mass with findings highly concerning for direct invasion into the splenic vein.   3.Colonic diverticulosis.                   Radiologist location ID: [id number]               Current Diet Order:  DIET REGULAR    Prophylaxis:    DVT/PE  Adressed as Appropriate         Assessment  Active Hospital Problems    Diagnosis    Primary Problem: Abscess of left groin    Cellulitis    Gram-positive cocci bacteremia    Sepsis due to cellulitis (CMS HCC)    Lactic acidosis    Rash    Thyroid disorder    Pancreatic mass    Mixed hyperlipidemia    Essential hypertension  HIV infection (CMS [place])          Plan:   Left groin abscess: Status post incision drainage, growing Gram-positive cocci, on vanc cefepime discontinued by ID.Marland Kitchen  Wound culture growing MRSA, continue vancomycin, general surgeon seen the patient, signed off, would like the patient to follow-up as outpatient on discharge with continuation of wound care until seen.    MRSA bacteremia:  ID follow, echo pending, was on cefepime, continue vancomycin, echocardiogram showing no valvular abnormalities     Lactic acidosis: Trended down to normal     Leukocytosis: White blood cell count trending down,      Pancreatic mass:  Patient has an appointment for EUS 01/20/2021 and [place]    HIV:  Continue medical management    Other chronic medical condition including hypothyroidism, mixed hyperlipidemia, essential hypertension    DVT prophylaxis: Lovenox  Placement:  Home, awaiting ID planned for antibiotics route and duration    SERVICE-- HOSPITALIST    Tod Persia, MD

## 2021-01-06 NOTE — Care Plan (Signed)
No events through the night. Patient received vanco and LR. Dressing remained CDI. Rash noted to neck and chest.       Problem: Adult Inpatient Plan of Care  Goal: Plan of Care Review  Outcome: Ongoing (see interventions/notes)  Goal: Patient-Specific Goal (Individualized)  Outcome: Ongoing (see interventions/notes)  Goal: Absence of Hospital-Acquired Illness or Injury  Outcome: Ongoing (see interventions/notes)  Intervention: Prevent Skin Injury  Recent Flowsheet Documentation  Taken 01/05/2021 2300 by Ardean Larsen, RN  Skin Protection:   adhesive use limited   skin-to-device areas padded  Goal: Optimal Comfort and Wellbeing  Outcome: Ongoing (see interventions/notes)  Goal: Rounds/Family Conference  Outcome: Ongoing (see interventions/notes)     Problem: Skin or Soft Tissue Infection  Goal: Absence of Infection Signs and Symptoms  Outcome: Ongoing (see interventions/notes)

## 2021-01-07 ENCOUNTER — Ambulatory Visit (INDEPENDENT_AMBULATORY_CARE_PROVIDER_SITE_OTHER): Payer: Self-pay | Admitting: SURGICAL ONCOLOGY

## 2021-01-07 DIAGNOSIS — I129 Hypertensive chronic kidney disease with stage 1 through stage 4 chronic kidney disease, or unspecified chronic kidney disease: Secondary | ICD-10-CM

## 2021-01-07 DIAGNOSIS — N189 Chronic kidney disease, unspecified: Secondary | ICD-10-CM

## 2021-01-07 LAB — BASIC METABOLIC PANEL
ANION GAP: 8 mmol/L (ref 4–13)
BUN/CREA RATIO: 28 — ABNORMAL HIGH (ref 6–22)
BUN: 27 mg/dL — ABNORMAL HIGH (ref 8–25)
CALCIUM: 8.9 mg/dL (ref 8.8–10.2)
CHLORIDE: 102 mmol/L (ref 96–111)
CO2 TOTAL: 26 mmol/L (ref 23–31)
CREATININE: 0.95 mg/dL (ref 0.75–1.35)
ESTIMATED GFR: 83 mL/min/BSA (ref >=60)
GLUCOSE: 144 mg/dL — ABNORMAL HIGH (ref 65–125)
POTASSIUM: 4.3 mmol/L (ref 3.5–5.1)
SODIUM: 136 mmol/L (ref 136–145)

## 2021-01-07 LAB — CBC
HCT: 41.8 % (ref 38.9–52.0)
HGB: 13.8 g/dL (ref 13.4–17.5)
MCH: 31 pg (ref 26.0–32.0)
MCHC: 33 g/dL (ref 31.0–35.5)
MCV: 93.9 fL (ref 78.0–100.0)
MPV: 10.3 fL (ref 8.7–12.5)
PLATELETS: 160 10*3/uL (ref 150–400)
RBC: 4.45 10*6/uL — ABNORMAL LOW (ref 4.50–6.10)
RDW-CV: 13.7 % (ref 11.5–15.5)
WBC: 13.9 10*3/uL — ABNORMAL HIGH (ref 3.7–11.0)

## 2021-01-07 LAB — MAGNESIUM: MAGNESIUM: 2.2 mg/dL (ref 1.8–2.6)

## 2021-01-07 LAB — ADULT ROUTINE BLOOD CULTURE, SET OF 2 BOTTLES (BACTERIA AND YEAST): BLOOD CULTURE, ROUTINE: NO GROWTH

## 2021-01-07 MED ORDER — SODIUM CHLORIDE 0.9 % (FLUSH) INJECTION SYRINGE
10.0000 mL | INJECTION | Freq: Three times a day (TID) | INTRAMUSCULAR | Status: DC
Start: 2021-01-07 — End: 2021-01-08
  Administered 2021-01-07: 15:00:00 0 mL via INTRAVENOUS

## 2021-01-07 MED ORDER — SODIUM CHLORIDE 0.9 % (FLUSH) INJECTION SYRINGE
20.0000 mL | INJECTION | INTRAMUSCULAR | Status: DC | PRN
Start: 2021-01-07 — End: 2021-01-08

## 2021-01-07 MED ORDER — VANCOMYCIN 5 GRAM INTRAVENOUS SOLUTION
15.0000 mg/kg | Freq: Two times a day (BID) | INTRAVENOUS | 0 refills | Status: AC
Start: 2021-01-08 — End: 2021-01-19

## 2021-01-07 NOTE — Nurses Notes (Signed)
Pt ambulating halls, request box lunch for dinner since he is " still waiting to go home". Staff apologized for the delay in discharge and provided pt with box lunch.

## 2021-01-07 NOTE — Care Management Notes (Addendum)
[place] Management Note    Patient Name: [name]  Date of Birth: September 05, 1945  Sex: male  Date/Time of Admission: 01/02/2021  8:47 AM  Room/Bed: 501/A  Payor: [place] / Plan: Jolayne Panther VACCN/OPTUM / Product Type: Managed Care /    LOS: 5 days   PCP: [place]    Admitting Diagnosis:  Cellulitis [L03.90]    Assessment:   Called and left message with Wylene Simmer at the [facility] regarding home health and IV antibiotics.  1320-Dr. Bage gave final recs, orders pended and referrals sent in All[employer]  1330-Spoke with Ok Edwards at [facility] and notified her of the referral.  1415-Spoke with Deb at the [facility], patient is approved for IV antibiotics and home health.  1420-Wendy with [facility] has accepted patient and they will be out to see patient in the am.  1422-Shea with [facility] states that they will deliver medicine today. Care team updated per secure epic chat.    Discharge Plan:  Home with Home Health and Home Infusion    The patient will continue to be evaluated for developing discharge needs.     Case Manager: Delray Alt, CASE MANAGER  Phone: [phone]

## 2021-01-07 NOTE — Progress Notes (Signed)
[facility]  Infectious Disease Consult  Follow Up Note    [name], 75 y.o. male  Date of Service: 01/07/2021  Date of Birth:  [birth year]    Hospital Day:  LOS: 5 days     HPI/Discussion:  [name] is a 75 y.o., White male with a past medical history significant for HIV, hypertension, hyperlipidemia, prostate cancer, hernia repair, known pancreatic mass, hypothyroidism presented to ED with left groin lesion.  Patient was recently seen in the Dermatology Clinic outpatient for penile wart and underwent cryotherapy but subsequently developed pain, swelling and purulent drainage from left groin which prompted ED visit.  CT abdomen pelvis done in the ED showed fat stranding in left superficial inguinal region and enlarging pancreatic mass into splenic vein.  Patient underwent I&D per General surgery recommendations on 12/04 and was empirically started on cefepime and vancomycin.  He was continued on Genvoya while inpatient for antiretroviral therapy.  Infectious diseases team consulted for antibiotic recommendations.        Physical Exam  BP (!) 145/85   Pulse 72   Temp 36.8 C (98.2 F)   Resp 18   Ht 1.803 m (_0 )   Wt 80.1 kg (176 lb 9.6 oz)   SpO2 98%   BMI 24.63 kg/m         General:   75 y.o. male appearing stated age.  Well appearing.  No acute distress.  Head:  Normocephalic and atraumatic.  ENT:  Membranes are moist.  Oropharynx free of erythema, exudates and thrush.  Neck:  Supple with full range of motion trachea is midline. No lymphadenopathy.  Respiratory:  Clear to auscultation bilaterally. No wheezes, rales or rhonchi.  Cardiovascular:  Regular rate and rhythm. No murmurs, rubs or gallops.    Abdomen:  Soft, nontender and nondistended.  Bowel sounds x4.    Extremities:  2+ pedal and radial pulses palpated.  No clubbing, cyanosis or edema. Neurological:  Cranial nerves 2-12 grossly intact. 5/5 strength in all extremities.       Labs: CBC with Diff (Last 48  Hours):    Recent Results in last 48 hours     01/06/21  0401 01/07/21  0559   WBC 13.5* 13.9*   HGB 14.4 13.8   HCT 44.1 41.8   MCV 92.8 93.9   PLTCNT 162 160       Last BMP  (Last result in the past 48 hours)      Na   K   Cl   CO2   BUN   Cr   Calcium   Glucose   Glucose-Fasting        01/07/21 0559 136   4.3   102   26   27   0.95   8.9   144             Lab Results   Component Value Date    ESR 27 (H) 06/01/2020         Microbiology:   Hospital Encounter on 01/02/21 (from the past 96 hour(s))   ADULT ROUTINE BLOOD CULTURE, SET OF 2 ADULT BOTTLES (BACTERIA AND YEAST)    Collection Time: 01/05/21  1:47 PM    Specimen: Blood   Culture Result Status    BLOOD CULTURE, ROUTINE No Growth 18-24 hrs. Preliminary   ADULT ROUTINE BLOOD CULTURE, SET OF 2 ADULT BOTTLES (BACTERIA AND YEAST)    Collection Time: 01/05/21  1:47 PM  Specimen: Blood   Culture Result Status    BLOOD CULTURE, ROUTINE No Growth 18-24 hrs. Preliminary       Imaging Studies:       Assessment/Recommendations:  Sepsis secondary to left groin abscess   Gram-positive bacteremia    Patient has a pertinent past medical history of HIV and prostate cancer  Recently seen outpatient in Dermatology office for penile warts and underwent cryotherapy and subsequently developed pain, swelling with purulent drainage  CT abdomen pelvis on admission shows fat stranding in left superficial inguinal region and enlargement of known pancreatic mass into splenic vein  WBC on admission 20.3--> 16.0--->13.5, afebrile   Underwent I&D per General surgery recommendations on 12/4  2/2 blood cultures admission on 12/03 are positive for MRSA   Wound culture positive for MRSA, anaerobic cultures negative  Repeat blood cultures show no growth in 24 hours  TTE was done on 12/05, no evidence of valvular vegetations  Currently on vanc day 6  Continue ART in the form of Genvoya  Continue IV vancomycin to complete a 2 week course.  PICC line ordered 12/8.  Follow-up with infectious  diseases clinic outpatient in 2 weeks    Case discussed with the attending physician      Rudean Haskell, MD

## 2021-01-07 NOTE — Nurses Notes (Signed)
Patient discharged via family transport at 2225. PICC line in place for home IV antibiotics.

## 2021-01-07 NOTE — Progress Notes (Addendum)
[facility]  Infectious Disease Consult  Follow Up Note    [name], [name], 75 y.o. male  Date of Service: 01/06/21  Date of Birth:  1945/07/16    Hospital Day:  LOS: 5 days     HPI/Discussion:  [name] is a 75 y.o., White male with a past medical history significant for HIV, hypertension, hyperlipidemia, prostate cancer, hernia repair, known pancreatic mass, hypothyroidism presented to ED with left groin lesion.  Patient was recently seen in the Dermatology Clinic outpatient for penile wart and underwent cryotherapy but subsequently developed pain, swelling and purulent drainage from left groin which prompted ED visit.  CT abdomen pelvis done in the ED showed fat stranding in left superficial inguinal region and enlarging pancreatic mass into splenic vein.  Patient underwent I&D per General surgery recommendations on 12/04 and was empirically started on cefepime and vancomycin.  He was continued on Genvoya while inpatient for antiretroviral therapy.  Infectious diseases team consulted for antibiotic recommendations.        Physical Exam  BP (!) 133/100   Pulse 78   Temp 36.8 C (98.2 F)   Resp 18   Ht 1.803 m ('5\' 11"'$ )   Wt 80.1 kg (176 lb 9.6 oz)   SpO2 98%   BMI 24.63 kg/m         General:   75 y.o. male appearing stated age.  Well appearing.  No acute distress.  Head:  Normocephalic and atraumatic.  ENT:  Membranes are moist.  Oropharynx free of erythema, exudates and thrush.  Neck:  Supple with full range of motion trachea is midline. No lymphadenopathy.  Respiratory:  Clear to auscultation bilaterally. No wheezes, rales or rhonchi.  Cardiovascular:  Regular rate and rhythm. No murmurs, rubs or gallops.    Abdomen:  Soft, nontender and nondistended.  Bowel sounds x4.    Extremities:  2+ pedal and radial pulses palpated.  No clubbing, cyanosis or edema. Neurological:  Cranial nerves 2-12 grossly intact. 5/5 strength in all extremities.       Labs: CBC with Diff (Last 48  Hours):    Recent Results in last 48 hours     01/06/21  0401 01/07/21  0559   WBC 13.5* 13.9*   HGB 14.4 13.8   HCT 44.1 41.8   MCV 92.8 93.9   PLTCNT 162 160       Last BMP  (Last result in the past 48 hours)      Na   K   Cl   CO2   BUN   Cr   Calcium   Glucose   Glucose-Fasting        01/07/21 0559 136   4.3   102   26   27   0.95   8.9   144             Lab Results   Component Value Date    ESR 27 (H) 06/01/2020         Microbiology:   Hospital Encounter on 01/02/21 (from the past 96 hour(s))   WOUND, SUPERFICIAL/NON-STERILE SITE, AEROBIC CULTURE AND GRAM STAIN    Collection Time: 01/03/21 11:29 AM    Specimen: Wound; Other   Culture Result Status    WOUND CULTURE Methicillin Resistant Staphylococcus aureus (A) Final    GRAM STAIN 1+ Rare PMNs Final    GRAM STAIN No Organisms Seen Final       Susceptibility    Methicillin Resistant  Staphylococcus aureus -  (no method available)     Penicillin >=0.5 Resistant      Oxacillin >=4 Resistant      Gentamicin <=0.5 Sensitive      Ciprofloxacin >=8 Resistant      Levofloxacin 4 Resistant      Inducible Clindamycin Resistance Neg       Erythromycin >=8 Resistant      Clindamycin >=8 Resistant      Linezolid 2 Sensitive      Vancomycin <=0.5 Sensitive      Tetracycline <=1 Sensitive      Rifampin <=0.5 Sensitive      Trimethoprim/Sulfamethoxazole <=10 Sensitive    ANAEROBIC CULTURE    Collection Time: 01/03/21 11:29 AM    Specimen: Wound; Other   Culture Result Status    ANAEROBIC CULTURE No Anaerobes Isolated Final   ADULT ROUTINE BLOOD CULTURE, SET OF 2 ADULT BOTTLES (BACTERIA AND YEAST)    Collection Time: 01/05/21  1:47 PM    Specimen: Blood   Culture Result Status    BLOOD CULTURE, ROUTINE No Growth 18-24 hrs. Preliminary   ADULT ROUTINE BLOOD CULTURE, SET OF 2 ADULT BOTTLES (BACTERIA AND YEAST)    Collection Time: 01/05/21  1:47 PM    Specimen: Blood   Culture Result Status    BLOOD CULTURE, ROUTINE No Growth 18-24 hrs. Preliminary       Imaging Studies:        Assessment/Recommendations:  Sepsis secondary to left groin abscess   Gram-positive bacteremia    Patient has a pertinent past medical history of HIV and prostate cancer  Recently seen outpatient in Dermatology office for penile warts and underwent cryotherapy and subsequently developed pain, swelling with purulent drainage  CT abdomen pelvis on admission shows fat stranding in left superficial inguinal region and enlargement of known pancreatic mass into splenic vein  WBC on admission 20.3--> 16.0--->13.5, afebrile   Underwent I&D per General surgery recommendations on 12/4  2/2 blood cultures admission on 12/03 are positive for MRSA   Wound culture positive for MRSA, anaerobic cultures negative  Repeat blood cultures are in process   TTE was done on 12/05, no evidence of valvular vegetations  Currently on vanc day 5  Continue ART in the form of Genvoya  Will continue to monitor clinical course and response to therapy    Case discussed with the attending physician      [name] Haskell, MD    Addendum was made with corrections to the above medical record.    [name] [place], PA

## 2021-01-07 NOTE — Nurses Notes (Signed)
[name] is sitting in bed watching TV and eating breakfast. Assessment completed, meds given. Hoping for discharge home today. Denies needs at this time.

## 2021-01-07 NOTE — Nurses Notes (Signed)
PICC line placed approximately hour ago, iv pain medicine given. While doing dressing change to left groin, blood began running down left arm at PICC line insertion. Staff called IV therapy, Varney Biles states to apply pressure to the site and she will be up momentarily to assess. kerlix wrapped around PICC, second RN applied pressure and bleeding stopped moments later. Staff continued with wound dressing change and remained with pt for next 30-45 minutes. No further bleeding noted to site. Pt denies further needs at that time.

## 2021-01-07 NOTE — Nurses Notes (Signed)
Dressing change completed to left groin.

## 2021-01-07 NOTE — Nurses Notes (Signed)
Dressing to PICC changed. No active bleeding noted, no dislodgement/movement of statlock or line. Pt states "blood was running down my arm", concerned with movement of line. Reassured him that it was still in place, and not unusual following placement to bleed some down through a wrinkle in dressing, esp as he was taking thinners until very recent. Apologized to pt and his wife for the miscommunication issue earlier.

## 2021-01-07 NOTE — Telephone Encounter (Signed)
This nurse spoke with pt. He states he just got out of the hospital for sepsis for an abscess. He has a PICC line and is doing antibiotics for 2-6 weeks at home. I spoke with Dr.Boone. Although we want the EUS in a timely manner it is also important for him to recover from this unexpected admission. He may wait until he is recovered prior to getting EUS at his convenience. Message to [facility] for update and r/s.  Rudene Anda, RN BSN 01/07/2021 09:12    Rudene Anda, RN BSN  Clinical Nurse Coordinator  Division of Surgical Oncology  Main Line: [phone]  Fax: [phone]

## 2021-01-07 NOTE — Nurses Notes (Signed)
Pt very upset/angry that PICC line has not been checked and antibiotic still needs to infuse prior to discharge. Staff apologized multiple times, pt remains angry and wanting to speak to pt advocate or supervisor. I advised pt that supervisor has been called and she will be up to speak to him ASAP.

## 2021-01-07 NOTE — Care Plan (Signed)
Pt is awake at this time. Alert/oriented, and pleasant. Respirations easy/unlabored, on roo air. He independently ambulates and is self sufficient. The dressing in left groin remains dry/intact. He rates pain 5 of 10 on pain scale. No distress noted. Safety measures maintained. IVL RUA, WNL. Call light, remote, and bedside table within reach Mayra Neer, RN  01/07/2021, 04:59

## 2021-01-07 NOTE — Nurses Notes (Signed)
Clerk called IV therapy to ask when someone  would be up to assess PICC line.

## 2021-01-07 NOTE — Nurses Notes (Signed)
Pt sitting up on side of bed, alert/oriented. Respirations easy/unlabored, on room air. Denies shortness of breath or other discomforts at this time. Rates left groin pain 4 of 10 on pain scale. No distress noted. Safety measures maintained. Call light within reach. Mayra Neer, RN  01/07/2021, 00:23

## 2021-01-08 NOTE — Care Management Notes (Signed)
Referral Information  ++++++ Placed Provider #1 ++++++  Case Manager: Pamela Miller  Provider Type: Home Health  Provider Name: [facility] [place] ([id number])  Address:  [address]  [address]  [place], [place] [id number]  Contact: Lydia Dowler    Phone: [phone] x  Fax:   Fax: [phone]

## 2021-01-08 NOTE — Care Management Notes (Signed)
Referral Information  ++++++ Placed Provider #1 ++++++  Case Manager: Pamela Miller  Provider Type: Home Infusion  Provider Name: [facility] [place], [place]  Address:  [address], [address]  [place], [place] [id number]  Contact:    Fax:   Fax:

## 2021-01-10 LAB — ADULT ROUTINE BLOOD CULTURE, SET OF 2 BOTTLES (BACTERIA AND YEAST): BLOOD CULTURE, ROUTINE: NO GROWTH

## 2021-01-10 NOTE — Discharge Summary (Signed)
[facility]  DISCHARGE SUMMARY      PATIENT NAME:  [name], [name]  MRN:  [id number]  DOB:  11-23-1945    ENCOUNTER DATE:  01/02/2021  INPATIENT ADMISSION DATE: 01/02/2021  DISCHARGE DATE:  01/07/21    ATTENDING PHYSICIAN: Tod Persia  SERVICE: CCM HOSPITALIST  PRIMARY CARE PHYSICIAN: [place]     Reason for Admission     Diagnosis    Cellulitis [92319]          DISCHARGE DIAGNOSIS:   Left groin abscess: Status post incision drainage.  MRSA bacteremia.    Lactic acidosis:  Resolved.    Pancreatic mass.    HIV.      Principal Problem:  Abscess of left groin    Active Hospital Problems    Diagnosis Date Noted   . Principle Problem: Abscess of left groin [L02.214] 01/03/2021   . Cellulitis [L03.90] 01/04/2021   . Gram-positive cocci bacteremia [R78.81] 01/03/2021   . Sepsis due to cellulitis (CMS HCC) [L03.90, A41.9] 01/02/2021   . Lactic acidosis [E87.20] 01/02/2021   . Rash [R21] 01/02/2021   . Thyroid disorder [E07.9] 11/03/2020   . Pancreatic mass [K86.89] 03/24/2020   . Mixed hyperlipidemia [E78.2] 03/24/2020   . Essential hypertension [I10] 03/24/2020   . HIV infection (CMS [place]) [B20] 03/24/2020      Resolved Hospital Problems   No resolved problems to display.     Active Non-Hospital Problems    Diagnosis Date Noted   . Candida infection of genital region 11/03/2020   . Chronic renal failure syndrome 11/03/2020   . Postherpetic neuralgia 11/03/2020   . Hyperlipidemia 11/03/2020   . Sprain of knee 11/03/2020   . Seborrhea 11/03/2020   . Positive laboratory testing for human immunodeficiency virus (CMS [place]) 11/03/2020   . 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 Actinic keratosis 11/03/2020   . Multiple actinic keratoses 11/03/2020   . Malignant neoplasm of prostate (CMS [place]) 11/03/2020   . Incisional hernia 11/03/2020   . Fall 11/03/2020   . Diverticulitis of colon 11/03/2020   . 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 Acute  cystitis without hematuria 04/19/2020   . Chest pain 03/24/2020   . Near syncope 03/24/2020   . Acute kidney injury (CMS [place]) 03/24/2020   . GERD without esophagitis 03/24/2020   . Acquired hypothyroidism 03/24/2020   . Generalized anxiety disorder 03/13/2020   . Cervicalgia 02/06/2020   . Low back pain 02/06/2020   . Pain in left knee 11/29/2019   . Inflammation of sacroiliac joint (CMS [place]) 02/15/2019   . Acute respiratory distress syndrome (ARDS) (CMS HCC) 01/17/2019   . Depressive disorder 01/17/2019   . Diverticulitis 01/17/2019   . Herpes zoster 01/17/2019   . Asymptomatic HIV infection (CMS [place]) 01/17/2019   . Risk of exposure to communicable disease 08/22/2018   . Nausea 08/22/2018      No Known Allergies         DISCHARGE MEDICATIONS:     Current Discharge Medication List      START taking these medications.      Details   vancomycin 1,150 mg in NS 261.5 mL infusion   15 mg/kg (1,150 mg), Intravenous, EVERY 12 HOURS, Mix and infuse per policy of Home Infusion Pharmacy.  Qty: 11 Each  Refills: 0        CONTINUE these medications - NO CHANGES were made during your  visit.      Details   AMLODIPINE ORAL   5 mg, Oral, DAILY  Refills: 0     atorvastatin 80 mg Tablet  Commonly known as: LIPITOR   80 mg, Oral, EVERY EVENING, Take one half pill every morning with breakfast (40 mg)  Refills: 0     cholecalciferol (vitamin D3) 25 mcg (1,000 unit) Tablet   1,000 Units, Oral, DAILY  Refills: 0     docusate sodium 100 mg Capsule  Commonly known as: COLACE   100 mg, Oral, 2 TIMES DAILY PRN  Refills: 0     gabapentin 300 mg Capsule  Commonly known as: NEURONTIN   1 Tablet, Oral, 3 TIMES DAILY  Refills: 0     Genvoya 150-150-200-10 mg Tablet  Generic drug: elviteg-cob-emtri-tenof ALAFEN   1 Tablet, DAILY, With the evening meal  Refills: 0     levothyroxine 88 mcg Tablet  Commonly known as: SYNTHROID   88 mcg, Oral, EVERY MORNING  Refills: 0     melatonin 10 mg Capsule   Oral, NIGHTLY  Refills: 0     omega-3-DHA-EPA-fish oil  1,000 mg (120 mg-180 mg) Capsule   Oral, DAILY  Refills: 0     omeprazole 20 mg Capsule, Delayed Release(E.C.)  Commonly known as: PRILOSEC   20 mg, Oral, 2 TIMES DAILY  Refills: 0     pantoprazole 40 mg Tablet, Delayed Release (E.C.)  Commonly known as: PROTONIX   40 mg, Oral, DAILY  Refills: 0     tamsulosin 0.4 mg Capsule  Commonly known as: FLOMAX   0.4 mg, Oral, 2 TIMES DAILY  Refills: 0          Discharge med list refreshed?  YES        DISCHARGE INSTRUCTIONS:  Post-Discharge Follow Up Appointments     Follow up with [place] , [place]    Phone: [phone]    Where: [address] [place], [place] [address]    Follow up with [facility] [place]    Phone: [phone]    Where: [place], [place], [place]  [address]    Follow up with [place]    Where: [place], [place] [address]    Follow up with Alinda Money, MD in 2 week(s)    Phone: [phone]    Where: [facility]    Wednesday Jan 20, 2021    Follow up with Drue Novel, MD    Phone: [phone]    Where: [facility]           Refer to [facility] [place] information for lab follow up Specify Provider and Contact info    Provider and Contact Info: Dr. Carmie End    Freedom of Choice: I have informed patient of their freedom of choice with respect to DME providers    Type of Line PICC    Line Flushes and Dressing Change Per [place] Protocol YES    Name of Medication/Infusion, Dose, Route, Frequency, Course Duration Vancomycin 1250 mg IV 2 times per day for 2 weeks, 1st dose given 01/04/21 2330    Please draw the following labs Vancomycin- CBC/diff, BUN/Cr, Vancomycin level (weekly)    Plan association [place] (Include name, phone & fax #s)    Plan association [place] (Include name, phone & fax #s)  DME - WOUND TREATMENT:APPLY TO WOUND    Left groin-beginning in the morning,  please irrigate wound daily with peroxide followed by saline irrigation and repacked with dry gauze and recover with dry dressing daily     Ht 180.3 cm    Wt 80.1 kg    Current Attending: Yalissa Fink    Medical Condition or Diagnosis which is primary reason for equipment: abcess left groin    Start Date 01/07/2021    Freedom of Choice: I have informed patient of their freedom of choice with respect to DME providers    Secure Dressing With Gauze    Secure Dressing With Tape    Change Dressing Daily           [place]:    Diem Pagnotta Montgomeryis a 75 y.o.,Whitemalewith PMH significant for HIV, HTN, HLD, prostate cancer, hernia repair, known pancreatic mass scheduled for EUS with biopsy in [facility] Monday 01-04-21, hypothyroidism.[name]  Patient presented to ER with concerns of a left groin skin lesion. He reported he had a wart beside penis on left side frozen off by dermatologyabout 1 week ago. States2 days agohe felt a "popping" sensation in left groin areaand now area has become more swollen and painful.CT scan also noted enlarging pancreatic mass with findings concerning for direct invasion into the splenic vein.  General surgeon has seen the patient status post incision and drainage for left groin abscess, ,  Culture came with MRSA bacteremia, has leukocytosis, white blood cell count trended down, continued vancomycin, id consulted recommendation for 2 weeks of vancomycin as outpatient.  Echocardiogram was done did not show any valvular involvement, PICC line inserted, lactic acidosis normalized Secondary To Infection for his pancreatic mass he has an appointment at [facility] for [facility] on 01/20/2021.wound care was following the patient, with wound care was arranged as outpatient by home health on discharge.  Oncologist has seen the patient during hospitalization recommendation to follow up regarding his EUS with Dr.Boone as outpatient.  At the time of the discharge patient  was medically stable to go home, and follow-up with Infectious Disease, general surgeon and oncologist as scheduled.                    CONDITION ON DISCHARGE:  A. Ambulation: Full ambulation  B. Self-care Ability: Complete  C. Cognitive Status Oriented to person  D. DNR status at discharge:  Full code  E. Lace + Score: 74 (01/07/21 0532)       Advance Directive Information    Flowsheet Row Most Recent Value   Does the Patient have an Advance Directive? Yes, Patient Does Have Advance Directive for Healthcare Treatment   Type of Advance Directive Completed Medical Living Will, Medical Power of Attorney   Name of [place] spouse   Phone Number of MPOA or Healthcare Surrogate [phone]          DISCHARGE DISPOSITION:    DISCHARGE PATIENT   Ordered at: 01/07/21 1446     Is there a planned readmission to acute care within 30 days?    No     Disposition:    HOME/SELF CARE/WITH FAMILY MEMBER/OTHER                     Tod Persia, MD    Time spent on discharge greater than 30 minutes? Yes      Copies sent to Care Team       Relationship  Specialty Notifications [place] PCP - General EXTERNAL  03/19/18     Phone: [phone] Fax: [phone]         [address] [place] [address]          Referring providers can utilize [email or web address] to access their referred [place] patient's information.

## 2021-01-11 ENCOUNTER — Other Ambulatory Visit: Payer: Medicare Other | Attending: INFECTIOUS DISEASE | Admitting: INFECTIOUS DISEASE

## 2021-01-11 DIAGNOSIS — B20 Human immunodeficiency virus [HIV] disease: Secondary | ICD-10-CM | POA: Insufficient documentation

## 2021-01-11 DIAGNOSIS — Z7902 Long term (current) use of antithrombotics/antiplatelets: Secondary | ICD-10-CM | POA: Insufficient documentation

## 2021-01-11 LAB — CBC WITH DIFF
BASOPHIL #: <0.1 10*3/uL (ref <=0.20)
BASOPHIL %: 0 %
EOSINOPHIL #: <0.1 10*3/uL (ref <=0.50)
EOSINOPHIL %: 0 %
HCT: 44.3 % (ref 38.9–52.0)
HGB: 14.5 g/dL (ref 13.4–17.5)
IMMATURE GRANULOCYTE #: 0.14 10*3/uL — ABNORMAL HIGH (ref <0.10)
IMMATURE GRANULOCYTE %: 1 % (ref 0–1)
LYMPHOCYTE #: 2.25 10*3/uL (ref 1.00–4.80)
LYMPHOCYTE %: 18 %
MCH: 30.8 pg (ref 26.0–32.0)
MCHC: 32.7 g/dL (ref 31.0–35.5)
MCV: 94.1 fL (ref 78.0–100.0)
MONOCYTE #: 0.47 10*3/uL (ref 0.20–1.10)
MONOCYTE %: 4 %
MPV: 10.5 fL (ref 8.7–12.5)
NEUTROPHIL #: 9.65 10*3/uL — ABNORMAL HIGH (ref 1.50–7.70)
NEUTROPHIL %: 77 %
PLATELETS: 144 10*3/uL — ABNORMAL LOW (ref 150–400)
RBC: 4.71 10*6/uL (ref 4.50–6.10)
RDW-CV: 13.8 % (ref 11.5–15.5)
WBC: 12.5 10*3/uL — ABNORMAL HIGH (ref 3.7–11.0)

## 2021-01-11 LAB — VANCOMYCIN, TROUGH: VANCOMYCIN TROUGH: 17.7 ug/mL (ref 10.0–20.0)

## 2021-01-11 LAB — CREATININE
CREATININE: 1.06 mg/dL (ref 0.75–1.35)
ESTIMATED GFR: 73 mL/min/BSA (ref >=60)

## 2021-01-11 LAB — BUN: BUN: 22 mg/dL (ref 8–25)

## 2021-01-18 ENCOUNTER — Other Ambulatory Visit: Payer: Medicare Other | Attending: INFECTIOUS DISEASE | Admitting: INFECTIOUS DISEASE

## 2021-01-18 DIAGNOSIS — Z7902 Long term (current) use of antithrombotics/antiplatelets: Secondary | ICD-10-CM | POA: Insufficient documentation

## 2021-01-18 DIAGNOSIS — B20 Human immunodeficiency virus [HIV] disease: Secondary | ICD-10-CM | POA: Insufficient documentation

## 2021-01-18 LAB — CBC WITH DIFF
BASOPHIL #: <0.1 10*3/uL (ref <=0.20)
BASOPHIL %: 0 %
EOSINOPHIL #: 0.13 10*3/uL (ref <=0.50)
EOSINOPHIL %: 2 %
HCT: 41.9 % (ref 38.9–52.0)
HGB: 13.7 g/dL (ref 13.4–17.5)
IMMATURE GRANULOCYTE #: <0.1 10*3/uL (ref <0.10)
IMMATURE GRANULOCYTE %: 1 % (ref 0–1)
LYMPHOCYTE #: 1.84 10*3/uL (ref 1.00–4.80)
LYMPHOCYTE %: 21 %
MCH: 30.4 pg (ref 26.0–32.0)
MCHC: 32.7 g/dL (ref 31.0–35.5)
MCV: 93.1 fL (ref 78.0–100.0)
MONOCYTE #: 0.7 10*3/uL (ref 0.20–1.10)
MONOCYTE %: 8 %
MPV: 10.4 fL (ref 8.7–12.5)
NEUTROPHIL #: 6.2 10*3/uL (ref 1.50–7.70)
NEUTROPHIL %: 68 %
PLATELETS: 147 10*3/uL — ABNORMAL LOW (ref 150–400)
RBC: 4.5 10*6/uL (ref 4.50–6.10)
RDW-CV: 14.1 % (ref 11.5–15.5)
WBC: 9 10*3/uL (ref 3.7–11.0)

## 2021-01-18 LAB — CREATININE
CREATININE: 1.24 mg/dL (ref 0.75–1.35)
ESTIMATED GFR: 61 mL/min/BSA (ref >=60)

## 2021-01-18 LAB — BUN: BUN: 17 mg/dL (ref 8–25)

## 2021-01-19 LAB — CD4/CD8
CD3 # (T CELL): 1479 cells/uL (ref 892–2436)
CD3 % (T CELL): 78 %
CD4 # (HELPER T CELL): 551 cells/uL (ref 382–1614)
CD4 % (HELPER T CELL): 29 %
CD4:CD8 RATIO: 0.6 — ABNORMAL LOW (ref 1.0–3.4)
CD8 # (SUPPRESSOR T CELL): 931 cells/uL — ABNORMAL HIGH (ref 157–813)
CD8 % (SUPPRESSOR T CELL): 49 %

## 2021-01-28 ENCOUNTER — Ambulatory Visit (INDEPENDENT_AMBULATORY_CARE_PROVIDER_SITE_OTHER): Payer: Self-pay | Admitting: SURGICAL ONCOLOGY

## 2021-01-28 NOTE — Telephone Encounter (Addendum)
I received a call from [name] stating that he needs to get his EUS scheduled.   Message sent to [place] [employer] to see if we can get him set up.  Hamilton Capri, RN  01/28/2021, 09:09      Boden, Amy Gaynelle Adu, RN 57 minutes ago (11:19 AM)     AB  Patient was scheduled and call and spoke with [name] and canceled because he was inpatient at another faculty. He was given our contact info to get rescheduled.      Ros[place] Miners, RN routed conversation to [place] [employer], RN 2 hours ago (10:10 AM)     [place] Miners, RN routed conversation to [employer]; Rose Phi, Amy J 2 hours ago (10:09 AM)     [place] Miners, RN 2 hours ago (10:08 AM)     JK  Received message that pt agreed to schedule EUS. Notified procedure schedulers. Roswell Miners, RN  01/28/2021, 10:05           Note        You  Roswell Miners, RN 3 hours ago (9:10 AM)     MP  Edmonia Revan,   I hope you're having a good day so far. I received a message from this patient that he is needing to get his EUS scheduled. Are you the best person for me to contact, or can you please guide me in the right direction? They had wanted this done ASAP but his treatment plan had been complicated by an infection. He is now ready to schedule. Thank you so much.   Hamilton Capri, RN 01/28/2021, 09:09      The patient called back asking if he could get his test rescheduled. He said he's been out of the hospital for 3 weeks now and is wanting to get this rescheduled.  Hamilton Capri, RN  02/02/2021, 10:52

## 2021-01-28 NOTE — Nursing Note (Signed)
Received message that pt agreed to schedule EUS. Notified procedure schedulers. Roswell Miners, RN  01/28/2021, 10:05

## 2021-02-05 ENCOUNTER — Other Ambulatory Visit: Payer: Self-pay

## 2021-02-05 ENCOUNTER — Inpatient Hospital Stay
Admission: RE | Admit: 2021-02-05 | Discharge: 2021-02-05 | Disposition: A | Discharge status: Home / Self Care | Payer: Medicare Other | Source: Ambulatory Visit

## 2021-02-05 ENCOUNTER — Inpatient Hospital Stay (HOSPITAL_COMMUNITY)
Admission: RE | Admit: 2021-02-05 | Discharge: 2021-02-05 | Disposition: A | Discharge status: Home / Self Care | Payer: 59 | Source: Ambulatory Visit

## 2021-02-05 ENCOUNTER — Encounter (HOSPITAL_COMMUNITY): Payer: Self-pay

## 2021-02-05 ENCOUNTER — Encounter (HOSPITAL_COMMUNITY): Payer: Self-pay | Admitting: Gastroenterology

## 2021-02-05 HISTORY — DX: Human immunodeficiency virus (HIV) disease: B20

## 2021-02-05 HISTORY — DX: Presence of spectacles and contact lenses: Z97.3

## 2021-02-05 HISTORY — DX: Disorder of thyroid, unspecified: E07.9

## 2021-02-05 NOTE — Anesthesia Preprocedure Evaluation (Signed)
ANESTHESIA PRE-OP EVALUATION  Philippa Sicks  Planned Procedure: ENDOSCOPIC U/S UPPER (Mouth)  Review of Systems    PONV                 Pulmonary    no sleep apnea and denies history of smoking   Cardiovascular    Hypertension, plays golf 5 out of 7 days, hyperlipidemia and DVT , Exercise Tolerance: > or = 4 METS        GI/Hepatic/Renal    GERD        Endo/Other    hypothyroidism,      Neuro/Psych/MS    back abnormality, depression     Cancer  CA,                   Physical Assessment      Airway       Mallampati: III    TM distance: >3 FB    Neck ROM: limited  Mouth Opening: good.            Dental           (+) chipped           Pulmonary    Breath sounds clear to auscultation  (-) no wheezes     Cardiovascular    Rhythm: regular  Rate: Normal       Other findings            Plan  ASA 3     Planned anesthesia type: MAC                     Intravenous induction     Anesthesia issues/risks discussed are: Aspiration, Post-op Intubation/Ventilation, Intraoperative Awareness/ Recall, PONV, Sore Throat, Cardiac Events/MI and Dental Injuries.  Anesthetic plan and risks discussed with patient and spouse  Signed consent obtained            Patient's NPO status is appropriate for Anesthesia.           Plan discussed with CRNA.                 EKG: Not ordered       ECHO 01/04/2021, in epic  Conclusions:  The left ventricular ejection fraction by visual assessment is estimated to be 60%.  There is a small pericardial effusion.  There is no echographic evidence to suggest cardiac tamponade.      MPS 03/25/2020, in epic  .  Normal myocardial perfusion study.  .  Post-stress ejection fraction was 78 %.      CT ANGIO CHEST 11/23/2020, in epic  1. No pulmonary embolus, focal infiltrate, or worrisome pulmonary mass.   2. Mild cardiomegaly with suspected bilateral dependent atelectasis versus mild scarring and/or fibrosis.   3. 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   4. Stable small hepatic cysts.        CXR: Not ordered      LABS Not Ordered      Consults: none        Patient instructed to take Lipitor, gabapentin, synthroid, Prilosec, Protonix, and Flomax the day of surgery.

## 2021-02-08 ENCOUNTER — Ambulatory Visit
Admission: RE | Admit: 2021-02-08 | Discharge: 2021-02-08 | Disposition: A | Discharge status: Home / Self Care | Payer: 59 | Source: Ambulatory Visit | Attending: Gastroenterology | Admitting: Gastroenterology

## 2021-02-08 ENCOUNTER — Encounter (HOSPITAL_COMMUNITY): Payer: Medicare Other | Admitting: Gastroenterology

## 2021-02-08 ENCOUNTER — Ambulatory Visit (HOSPITAL_BASED_OUTPATIENT_CLINIC_OR_DEPARTMENT_OTHER): Payer: 59 | Admitting: Family

## 2021-02-08 ENCOUNTER — Encounter (HOSPITAL_COMMUNITY)
Admission: RE | Disposition: A | Discharge status: Home / Self Care | Payer: Self-pay | Source: Ambulatory Visit | Attending: Gastroenterology

## 2021-02-08 ENCOUNTER — Ambulatory Visit (HOSPITAL_COMMUNITY): Payer: 59 | Admitting: Family

## 2021-02-08 ENCOUNTER — Encounter (HOSPITAL_COMMUNITY): Payer: Self-pay | Admitting: Gastroenterology

## 2021-02-08 ENCOUNTER — Ambulatory Visit (HOSPITAL_COMMUNITY): Payer: 59

## 2021-02-08 ENCOUNTER — Other Ambulatory Visit: Payer: Self-pay

## 2021-02-08 DIAGNOSIS — K862 Cyst of pancreas: Secondary | ICD-10-CM

## 2021-02-08 SURGERY — ENDOSCOPIC U/S UPPER
Anesthesia: Monitor Anesthesia Care | Site: Mouth | Wound class: Clean Contaminated Wounds-The respiratory, GI, Genital, or urinary

## 2021-02-08 MED ORDER — PROPOFOL 10 MG/ML INTRAVENOUS EMULSION
INTRAVENOUS | Status: DC | PRN
Start: 2021-02-08 — End: 2021-02-08
  Administered 2021-02-08: 100 ug/kg/min via INTRAVENOUS
  Administered 2021-02-08: 0 ug/kg/min via INTRAVENOUS
  Administered 2021-02-08: 80 ug/kg/min via INTRAVENOUS
  Administered 2021-02-08: 70 ug/kg/min via INTRAVENOUS
  Administered 2021-02-08: 150 ug/kg/min via INTRAVENOUS

## 2021-02-08 MED ORDER — DEXAMETHASONE SODIUM PHOSPHATE (PF) 10 MG/ML INJECTION SOLUTION
4.0000 mg | Freq: Once | INTRAMUSCULAR | Status: DC | PRN
Start: 2021-02-08 — End: 2021-02-08

## 2021-02-08 MED ORDER — DEXTROSE 5% IN WATER (D5W) FLUSH BAG - 250 ML
INTRAVENOUS | Status: DC | PRN
Start: 2021-02-08 — End: 2021-02-08

## 2021-02-08 MED ORDER — FENTANYL (PF) 50 MCG/ML INJECTION SOLUTION
12.5000 ug | INTRAMUSCULAR | Status: DC | PRN
Start: 2021-02-08 — End: 2021-02-08

## 2021-02-08 MED ORDER — LACTATED RINGERS INTRAVENOUS SOLUTION
INTRAVENOUS | Status: DC
Start: 2021-02-08 — End: 2021-02-08

## 2021-02-08 MED ORDER — CIPROFLOXACIN 500 MG TABLET
500.0000 mg | ORAL_TABLET | Freq: Every day | ORAL | 0 refills | Status: AC
Start: 2021-02-08 — End: 2021-02-11

## 2021-02-08 MED ORDER — SODIUM CHLORIDE 0.9 % (FLUSH) INJECTION SYRINGE
2.0000 mL | INJECTION | INTRAMUSCULAR | Status: DC | PRN
Start: 2021-02-08 — End: 2021-02-08

## 2021-02-08 MED ORDER — LIDOCAINE (PF) 100 MG/5 ML (2 %) INTRAVENOUS SYRINGE
INJECTION | Freq: Once | INTRAVENOUS | Status: DC | PRN
Start: 2021-02-08 — End: 2021-02-08
  Administered 2021-02-08: 60 mg via INTRAVENOUS

## 2021-02-08 MED ORDER — SODIUM CHLORIDE 0.9 % (FLUSH) INJECTION SYRINGE
2.0000 mL | INJECTION | Freq: Three times a day (TID) | INTRAMUSCULAR | Status: DC
Start: 2021-02-08 — End: 2021-02-08

## 2021-02-08 MED ORDER — ONDANSETRON HCL (PF) 4 MG/2 ML INJECTION SOLUTION
4.0000 mg | Freq: Once | INTRAMUSCULAR | Status: DC | PRN
Start: 2021-02-08 — End: 2021-02-08

## 2021-02-08 MED ORDER — SODIUM CHLORIDE 0.9% FLUSH BAG - 250 ML
INTRAVENOUS | Status: DC | PRN
Start: 2021-02-08 — End: 2021-02-08

## 2021-02-08 MED ORDER — PROCHLORPERAZINE EDISYLATE 10 MG/2 ML (5 MG/ML) INJECTION SOLUTION
5.0000 mg | Freq: Once | INTRAMUSCULAR | Status: DC | PRN
Start: 2021-02-08 — End: 2021-02-08

## 2021-02-08 MED ORDER — FENTANYL (PF) 50 MCG/ML INJECTION SOLUTION
25.0000 ug | INTRAMUSCULAR | Status: DC | PRN
Start: 2021-02-08 — End: 2021-02-08

## 2021-02-08 MED ORDER — PROPOFOL 10 MG/ML IV BOLUS
INJECTION | Freq: Once | INTRAVENOUS | Status: DC | PRN
Start: 2021-02-08 — End: 2021-02-08
  Administered 2021-02-08 (×2): 50 mg via INTRAVENOUS

## 2021-02-08 SURGICAL SUPPLY — 4 items
KIT ENDO (INSTRUMENTS ENDOMECHANICAL) ×1
KIT ENDOS CMPLN ENDOKIT ORCAPOD 4 1.1OZ (ENDOSCOPIC SUPPLIES) ×1 IMPLANT
NEEDLE BIOPSY 19GA 1.14MM ACQUIRE US STRL LF  DISP (ENDOSCOPIC SUPPLIES) ×1 IMPLANT
NEEDLE BIOPSY 19GA 1.14MM ACQU_IRE US STRL LF DISP (INSTRUMENTS ENDOMECHANICAL) ×1

## 2021-02-08 NOTE — H&P (Signed)
[facility]  GI Admission History and Physical      [name]   MRN:  [id number]  Date of Birth:  [birth year]    Date of Procedure:  02/08/2021    Chief Complaint: Abnormal Imaging of GI Tract     HPI: [name], [name] is a 76 y.o. year old male who presents today for EGD  and EUS +/- FNA  Pt referred from Dr. Cyndi Bender to evaluate pancreatic cystr . The patient denies chills, fever, jaundice, melena , nausea, personal history of pancreatitis or unintentional weight loss    Pt is not on anticoagulation.        Past Medical History:   Diagnosis Date   . Cancer (CMS [facility])     prostate   . Deep vein thrombosis (DVT) (CMS HCC)     right leg   . Esophageal reflux    . H/O hearing loss    . High cholesterol    . 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 HTN (hypertension)    . 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 Hyperlipidemia     "borderline"   . Hypothyroidism    . MRSA (methicillin resistant staph aureus) culture positive 01/02/2021    MRSA left groin abscess 01/03/21   . MRSA (methicillin resistant staph aureus) culture positive 01/02/2021    MRSA blood 01/02/21   . Thyroid disorder    . Wears glasses            No Known Allergies    Medications Prior to Admission     Prescriptions    amlodipine besylate (AMLODIPINE ORAL)    Take 5 mg by mouth Every night    atorvastatin (LIPITOR) 80 mg Oral Tablet    Take 40 mg by mouth Once a day Take one half pill every morning with breakfast (40 mg)    cholecalciferol, vitamin D3, 25 mcg (1,000 unit) Oral Tablet    Take 1 Tablet (1,000 Units total) by mouth Once a day    DULoxetine (CYMBALTA) 20 mg Oral Capsule, Delayed Release(E.C.)    Take 2 Capsules (40 mg total) by mouth Every night    elviteg-cob-emtri-tenof ALAFEN (GENVOYA) 150-150-200-10 mg Oral Tablet    1 Tablet Once a day With the evening meal    gabapentin (NEURONTIN) 300 mg Oral Capsule    Take 1 Capsule (300 mg total) by mouth Twice daily     levothyroxine (SYNTHROID) 88 mcg Oral Tablet    Take 1 Tablet (88 mcg total) by mouth Every morning    melatonin 10 mg Oral Capsule    Take by mouth Every night    omega-3-DHA-EPA-fish oil 1,000 mg (120 mg-180 mg) Oral Capsule    Take by mouth Once a day    omeprazole (PRILOSEC) 20 mg Oral Capsule, Delayed Release(E.C.)    Take 1 Capsule (20 mg total) by mouth Twice daily    pantoprazole (PROTONIX) 40 mg Oral Tablet, Delayed Release (E.C.)    Take 1 Tablet (40 mg total) by mouth Once a day    tamsulosin (FLOMAX) 0.4 mg Oral Capsule    Take 1 Capsule (0.4 mg total) by mouth Twice daily    vancomycin 1,150 mg in NS 261.5 mL infusion    Infuse 1,150 mg into a venous catheter Every 12 hours for 11 days Mix and infuse per policy of [facility].  Past Surgical History:   Procedure Laterality Date   . COLON SURGERY      per patient had part of colon removed and had a colostomy   . COLONOSCOPY     . GASTROSCOPY     . 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 Parke Simmers   . HX CERVICAL SPINE SURGERY     . HX COLOSTOMY REVERSAL     . HX HERNIA REPAIR     . KNEE SURGERY Bilateral    . WRIST SURGERY Right            Physical Exam:  Constitutional:  appears stated age, no distress and vital signs reviewed  Eyes:  Conjunctiva clear., Pupils equal and round. , Sclera non-icteric.   ENT:  Mucous membranes moist. Neck supple, symmetrical, trachea midline  Respiratory:  CTAB, no rhonchi, rales or wheezing. Non-labored on RA. No cough  Cardiovascular:     RRR, no murmur, no pedal edema, +2x4 pulses  Gastrointestinal:  Soft, non-tender, Bowel sounds normal, non-distended, No masses, Well healed scar  Musculoskeletal:  Head atraumatic and normocephalic  Integumentary:  Skin color, texture, turgor normal. No rashes or lesions. No jaundice.  Neurologic:  Grossly normal, Alert and oriented x3  Psychiatric:  Affect Normal    Assessment:  Abnormal Imaging of  GI Tract     Plan:  Proceed with EGD  and EUS +/- FNA    Orders Placed This Encounter   . EUS PROCEDURE   . [place]   . INSERT & MAINTAIN PERIPHERAL IV ACCESS   . PERIPHERAL IV DRESSING CHANGE   . NS flush syringe   . NS flush syringe   . NS 250 mL flush bag   . D5W 250 mL flush bag   . LR premix infusion       - Lin Landsman, MD    Advanced Endoscopy Fellow

## 2021-02-08 NOTE — Discharge Instructions (Signed)
SURGICAL DISCHARGE INSTRUCTIONS     Dr. Thakkar, Shyam, MD  performed your ENDOSCOPIC U/S UPPER today at the [facility]    [facility]:  Monday through Friday from 6 a.m. - 7 p.m.: [phone]  Between 7 p.m. - 6 a.m., weekends and holidays:  Call [facility] at [phone] or [phone].    PLEASE SEE WRITTEN HANDOUTS AS DISCUSSED BY YOUR NURSE:      SIGNS AND SYMPTOMS OF A WOUND / INCISION INFECTION   Be sure to watch for the following:  Increase in redness or red streaks near or around the wound or incision.  Increase in pain that is intense or severe and cannot be relieved by the pain medication that your doctor has given you.  Increase in swelling that cannot be relieved by elevation of a body part, or by applying ice, if permitted.  Increase in drainage, or if yellow / green in color and smells bad. This could be on a dressing or a cast.  Increase in fever for longer than 24 hours, or an increase that is higher than 101 degrees Fahrenheit (normal body temperature is 98 degrees Fahrenheit). The incision may feel warm to the touch.    **CALL YOUR DOCTOR IF ONE OR MORE OF THESE SIGNS / SYMPTOMS SHOULD OCCUR.    ANESTHESIA INFORMATION   ANESTHESIA -- ADULT PATIENTS:  You have received intravenous sedation / general anesthesia, and you may feel drowsy and light-headed for several hours. You may even experience some forgetfulness of the procedure. DO NOT DRIVE A MOTOR VEHICLE or perform any activity requiring complete alertness or coordination until you feel fully awake in about 24-48 hours. Do not drink alcoholic beverages for at least 24 hours. Do not stay alone, you must have a responsible adult available to be with you. You may also experience a dry mouth or nausea for 24 hours. This is a normal side effect and will disappear as the effects of the medication wear off.    REMEMBER   If you experience any difficulty breathing, chest pain, bleeding that you feel is excessive,  persistent nausea or vomiting or for any other concerns:  Call your physician Dr. Thakkar at [phone] or [phone]. You may also ask to have the GI doctor on call paged. They are available to you 24 hours a day.    SPECIAL INSTRUCTIONS / COMMENTS   Instructions for Upper Endoscopy:  Patient Information:  No driving until tomorrow, You may experience a sore throat and gas or bloating for 24 hours, and Use an antacid for mild discomfort  REMEMBER TO:  Call your physician for any of the following symptoms:  1.) Persistent vomiting or vomiting bright red blood  2.) Fever (temperature greater than 100F)  3.) Any difficulty breathing.      FOLLOW-UP APPOINTMENTS   Please call patient services at [phone] or [phone] to schedule a date / time of return. They are open Monday - Friday from 7:30 am - 5:00 pm.

## 2021-02-08 NOTE — OR Nursing (Signed)
Tolerated procedure well.

## 2021-02-08 NOTE — Anesthesia Postprocedure Evaluation (Signed)
Anesthesia Post Op Evaluation    Patient: [name]  Procedure(s):  ENDOSCOPIC U/S UPPER    Last Vitals:Temperature: 36.2 C (97.2 F) (02/08/21 1307)  Heart Rate: 72 (02/08/21 1330)  BP (Non-Invasive): (!) 125/93 (02/08/21 1330)  Respiratory Rate: 12 (02/08/21 1330)  SpO2: 95 % (02/08/21 1330)    No notable events documented.      Patient location during evaluation: PACU       Patient participation: complete - patient participated  Level of consciousness: awake    Pain management: adequate  Airway patency: patent    Anesthetic complications: no  Cardiovascular status: acceptable  Respiratory status: acceptable  Hydration status: acceptable  Patient post-procedure temperature: Pt Normothermic   PONV Status: Absent

## 2021-02-08 NOTE — Anesthesia Transfer of Care (Signed)
ANESTHESIA TRANSFER OF CARE   [name] is a 76 y.o. ,male, Weight: 79.5 kg (175 lb 4.3 oz)   had Procedure(s):  ENDOSCOPIC U/S UPPER  performed  02/08/21   Primary Service: Shannan Harper, MD    Past Medical History:   Diagnosis Date   . Cancer (CMS [facility])     prostate   . Deep vein thrombosis (DVT) (CMS HCC)     right leg   . Esophageal reflux    . H/O hearing loss    . High cholesterol    . 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 HTN (hypertension)    . 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 Hyperlipidemia     "borderline"   . Hypothyroidism    . MRSA (methicillin resistant staph aureus) culture positive 01/02/2021    MRSA left groin abscess 01/03/21   . MRSA (methicillin resistant staph aureus) culture positive 01/02/2021    MRSA blood 01/02/21   . Thyroid disorder    . Wears glasses       Allergy History as of 02/08/21      No Known Allergies              I completed my transfer of care / handoff to the receiving personnel during which we discussed:  Access, Airway, All key/critical aspects of case discussed, Analgesia, Antibiotics, Expectation of post procedure, Fluids/Product, Gave opportunity for questions and acknowledgement of understanding and PMHx    Post Location: PACU                                          Additional Info:Pt to pacu AA. VSS. Report given to RN at bedside.                        Last OR Temp: Temperature: 36.2 C (97.2 F)  ABG:  POTASSIUM   Date Value Ref Range Status   01/07/2021 4.3 3.5 - 5.1 mmol/L Final     KETONES   Date Value Ref Range Status   01/02/2021 Not Detected Not Detected mg/dL Final     CALCIUM   Date Value Ref Range Status   01/07/2021 8.9 8.8 - 10.2 mg/dL Final     Calculated P Axis   Date Value Ref Range Status   03/24/2020 33 degrees Final     Calculated R Axis   Date Value Ref Range Status   03/24/2020 34 degrees Final     Calculated T Axis   Date Value Ref Range Status   03/24/2020 50 degrees Final      Airway:* No LDAs found *  Blood pressure (!) 135/95, pulse 95, temperature 36.2 C (97.2 F), resp. rate 16, height 1.77 m (5' 9.69"), weight 79.5 kg (175 lb 4.3 oz), SpO2 95 %.

## 2021-02-10 LAB — SURGICAL PATHOLOGY SPECIMEN

## 2021-02-11 DIAGNOSIS — K862 Cyst of pancreas: Secondary | ICD-10-CM

## 2021-02-11 LAB — CYTOPATHOLOGY, FINE NEEDLE ASPIRATE

## 2021-02-15 ENCOUNTER — Telehealth (INDEPENDENT_AMBULATORY_CARE_PROVIDER_SITE_OTHER): Payer: Self-pay

## 2021-02-15 NOTE — Telephone Encounter (Signed)
Called pt to schedule OV w/Dr. Theodora Blow Indication: discuss pancreas cyst results from EUS. Left message w/pt wife and he'll call back to schedule. Will f/u. Violet Cart, PATIENT NAVIGATOR  02/15/2021, 11:11

## 2021-02-22 ENCOUNTER — Ambulatory Visit (INDEPENDENT_AMBULATORY_CARE_PROVIDER_SITE_OTHER): Payer: Self-pay | Admitting: SURGICAL ONCOLOGY

## 2021-02-22 NOTE — Telephone Encounter (Signed)
I called and spoke with the patient regarding a voicemail that I received. He wanted to know what the results were of his testing and when he can have the procedure with Dr. Cyndi Bender. I advised that I called [place] molecular office to see if the PancSeq results were back yet. They stated that they received the results on 02/18/21 and it will take about 7-10 days for the testing to be completed. I advised the patient of this.  [name] Capri, RN  02/22/2021, 10:35

## 2021-03-04 ENCOUNTER — Ambulatory Visit: Payer: 59 | Attending: SURGICAL ONCOLOGY | Admitting: SURGICAL ONCOLOGY

## 2021-03-04 ENCOUNTER — Ambulatory Visit (HOSPITAL_COMMUNITY)
Admission: RE | Admit: 2021-03-04 | Discharge: 2021-03-04 | Disposition: A | Discharge status: Home / Self Care | Payer: 59 | Source: Ambulatory Visit

## 2021-03-04 ENCOUNTER — Other Ambulatory Visit: Payer: Self-pay

## 2021-03-04 ENCOUNTER — Ambulatory Visit (INDEPENDENT_AMBULATORY_CARE_PROVIDER_SITE_OTHER): Payer: 59 | Admitting: Registered Nurse

## 2021-03-04 ENCOUNTER — Encounter (INDEPENDENT_AMBULATORY_CARE_PROVIDER_SITE_OTHER): Payer: Self-pay | Admitting: Registered Nurse

## 2021-03-04 ENCOUNTER — Encounter (HOSPITAL_COMMUNITY): Payer: Self-pay

## 2021-03-04 ENCOUNTER — Encounter (INDEPENDENT_AMBULATORY_CARE_PROVIDER_SITE_OTHER): Payer: Self-pay

## 2021-03-04 ENCOUNTER — Encounter (HOSPITAL_BASED_OUTPATIENT_CLINIC_OR_DEPARTMENT_OTHER): Payer: Self-pay | Admitting: SURGICAL ONCOLOGY

## 2021-03-04 VITALS — BP 134/70 | HR 98 | Temp 97.8°F | Resp 18 | Ht 69.0 in | Wt 174.0 lb

## 2021-03-04 VITALS — BP 139/86 | HR 106 | Temp 97.5°F | Resp 18 | Ht 69.0 in | Wt 174.0 lb

## 2021-03-04 DIAGNOSIS — J340 Abscess, furuncle and carbuncle of nose: Secondary | ICD-10-CM

## 2021-03-04 DIAGNOSIS — Z23 Encounter for immunization: Secondary | ICD-10-CM | POA: Insufficient documentation

## 2021-03-04 DIAGNOSIS — Z8546 Personal history of malignant neoplasm of prostate: Secondary | ICD-10-CM | POA: Insufficient documentation

## 2021-03-04 DIAGNOSIS — K862 Cyst of pancreas: Secondary | ICD-10-CM | POA: Insufficient documentation

## 2021-03-04 DIAGNOSIS — C61 Malignant neoplasm of prostate: Secondary | ICD-10-CM

## 2021-03-04 DIAGNOSIS — B20 Human immunodeficiency virus [HIV] disease: Secondary | ICD-10-CM

## 2021-03-04 DIAGNOSIS — K8689 Other specified diseases of pancreas: Secondary | ICD-10-CM

## 2021-03-04 LAB — POC BLOOD GLUCOSE (RESULTS): GLUCOSE, POC: 249 mg/dL — ABNORMAL HIGH (ref 70–105)

## 2021-03-04 MED ORDER — PNEUMOCOCCAL 15-VALENT CONJ VACCINE-DIP CRM (PF) 0.5 ML IM SYRINGE
INJECTION | INTRAMUSCULAR | Status: AC
Start: 2021-03-04 — End: 2021-03-04
  Filled 2021-03-04: qty 0.5

## 2021-03-04 MED ORDER — CEPHALEXIN 500 MG CAPSULE
500.0000 mg | ORAL_CAPSULE | Freq: Four times a day (QID) | ORAL | 0 refills | Status: DC
Start: 2021-03-04 — End: 2021-03-25

## 2021-03-04 MED ORDER — HAEMOPHILUS B POLYSACCHARID CONJ-TETANUS TOX(PF) 10 MCG/0.5 ML IM SOLN
INTRAMUSCULAR | Status: AC
Start: 2021-03-04 — End: 2021-03-04
  Filled 2021-03-04: qty 0.5

## 2021-03-04 MED ORDER — MENINGOCOCCAL B VAC,4-CMP 50 MCG-50 MCG-50 MCG-25 MCG/0.5ML IM SYRINGE
INJECTION | INTRAMUSCULAR | Status: AC
Start: 2021-03-04 — End: 2021-03-04
  Filled 2021-03-04: qty 0.5

## 2021-03-04 NOTE — Patient Instructions (Signed)
May take Tylenol if needed  Cool compresses  Start Keflex twice a day x 10 days.

## 2021-03-04 NOTE — Nursing Note (Signed)
Received order to administer splenectomy vaccines. Information handout given to pt and all questions answered. Injection given in right and left deltoid and bandaid applied. Pt tolerated well and left ambulatory.      Georgiana Spinner, MA 03/04/2021, 16:54

## 2021-03-04 NOTE — Progress Notes (Signed)
[address] [facility]  [place]  [place] [address]  [phone]    Progress Note    Date: 03/04/2021  Name: [name]   MRN: [id number]   DOB: 12/06/1945   PCP: [facility]    Reason for Visit: Skin Infection (Nose is red and swollen started last night)    History of Present Illness  [name] is a 76 y.o. male who is being seen today for swelling, redness, warmth, and tenderness to the end of nose.  He reports that it started with a pimple like area which is squeezed but only go clear drainage.  Last night his nose worsened so he came in today.    Nursing Notes:  There are no exam notes on file for this visit.    Current Outpatient Medications   Medication Sig   . amlodipine besylate (AMLODIPINE ORAL) Take 5 mg by mouth Every night   . atorvastatin (LIPITOR) 80 mg Oral Tablet Take 40 mg by mouth Once a day Take one half pill every morning with breakfast (40 mg)   . BIOTIN ORAL Take by mouth   . cephalexin (KEFLEX) 500 mg Oral Capsule Take 1 Capsule (500 mg total) by mouth Four times a day for 10 days   . cetirizine (ZYRTEC) 10 mg Oral Tablet Take 1 Tablet (10 mg total) by mouth Once a day   . cholecalciferol, vitamin D3, 25 mcg (1,000 unit) Oral Tablet Take 1 Tablet (1,000 Units total) by mouth Once a day   . DULoxetine (CYMBALTA DR) 20 mg Oral Capsule, Delayed Release(E.C.) Take 2 Capsules (40 mg total) by mouth Every night   . elviteg-cob-emtri-tenof ALAFEN (GENVOYA) 150-150-200-10 mg Oral Tablet 1 Tablet Once a day With the evening meal   . gabapentin (NEURONTIN) 300 mg Oral Capsule Take 1 Capsule (300 mg total) by mouth Twice daily   . levothyroxine (SYNTHROID) 88 mcg Oral Tablet Take 75 mcg by mouth Every morning   . melatonin 10 mg Oral Capsule Take by mouth Every night   . MULTIVITAMIN ORAL Take by mouth   . omega-3-DHA-EPA-fish oil 1,000 mg (120 mg-180 mg) Oral Capsule Take by mouth Once a day   . omeprazole (PRILOSEC) 20 mg Oral Capsule, Delayed  Release(E.C.) Take 1 Capsule (20 mg total) by mouth Twice daily   . pantoprazole (PROTONIX) 40 mg Oral Tablet, Delayed Release (E.C.) Take 1 Tablet (40 mg total) by mouth Twice daily   . POTASSIUM ORAL Take by mouth   . tamsulosin (FLOMAX) 0.4 mg Oral Capsule Take 1 Capsule (0.4 mg total) by mouth Twice daily (Patient not taking: Reported on 02/08/2021)     No Known Allergies  Past Medical History:   Diagnosis Date   . Cancer (CMS [facility])     prostate   . Deep vein thrombosis (DVT) (CMS HCC)     right leg   . Esophageal reflux    . H/O hearing loss    . High cholesterol    . 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 HTN (hypertension)    . 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 Hyperlipidemia     "borderline"   . Hypothyroidism    . MRSA (methicillin resistant staph aureus) culture positive 01/02/2021    MRSA left groin abscess 01/03/21   . MRSA (methicillin resistant staph aureus) culture positive 01/02/2021  MRSA blood 01/02/21   . Thyroid disorder    . Wears glasses          Past Surgical History:   Procedure Laterality Date   . COLON SURGERY      per patient had part of colon removed and had a colostomy   . COLONOSCOPY     . GASTROSCOPY     . 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 Parke Simmers   . [place]  2001   . HX COLOSTOMY REVERSAL     . HX HERNIA REPAIR     . KNEE SURGERY Bilateral    . WRIST SURGERY Right          Family Medical History:     Problem Relation (Age of Onset)    Cancer Mother, Father, Other    Heart Attack Mother    High Cholesterol Other    Stroke Other          Social History     Tobacco Use   . Smoking status: Never   . Smokeless tobacco: Never   Vaping Use   . Vaping Use: Never used   Substance Use Topics   . Alcohol use: Not Currently   . Drug use: Never       Review of Systems  ROS    Physical Exam:  Vitals:    03/04/21 1006   BP: 134/70   Pulse: 98    Resp: 18   Temp: 36.6 C (97.8 F)   SpO2: 97%   Weight: 78.9 kg (174 lb)   Height: 1.753 m (5\' 9" )   BMI: 25.75      Physical Exam  Vitals and nursing note reviewed.   Constitutional:       Appearance: Normal appearance.   Pulmonary:      Effort: Pulmonary effort is normal.   Skin:     General: Skin is warm and dry.      Comments: Swelling, warmth and redness to the distal nose.   Neurological:      Mental Status: He is alert and oriented to person, place, and time.   Psychiatric:         Mood and Affect: Mood normal.         Behavior: Behavior normal.         Assessment:  Problem List Items Addressed This Visit    None  Visit Diagnoses     Cellulitis of external nose    -  Primary    Relevant Medications    cephalexin (KEFLEX) 500 mg Oral Capsule          Plan/Patient Instructions  Patient Instructions   May take Tylenol if needed  Cool compresses  Start Keflex twice a day x 10 days.  Follow up with PCP.  Seek medical attention for new or worsening symptoms.    I saw the patient independently.        Benjaman Kindler, NP

## 2021-03-05 ENCOUNTER — Encounter (INDEPENDENT_AMBULATORY_CARE_PROVIDER_SITE_OTHER): Payer: Self-pay | Admitting: NURSE PRACTITIONER

## 2021-03-05 ENCOUNTER — Other Ambulatory Visit: Payer: Medicare Other | Attending: NURSE PRACTITIONER

## 2021-03-05 ENCOUNTER — Other Ambulatory Visit (INDEPENDENT_AMBULATORY_CARE_PROVIDER_SITE_OTHER): Payer: Self-pay | Admitting: NURSE PRACTITIONER

## 2021-03-05 ENCOUNTER — Ambulatory Visit (INDEPENDENT_AMBULATORY_CARE_PROVIDER_SITE_OTHER): Payer: 59

## 2021-03-05 DIAGNOSIS — J340 Abscess, furuncle and carbuncle of nose: Secondary | ICD-10-CM | POA: Insufficient documentation

## 2021-03-08 ENCOUNTER — Telehealth (INDEPENDENT_AMBULATORY_CARE_PROVIDER_SITE_OTHER): Payer: Self-pay | Admitting: SURGICAL ONCOLOGY

## 2021-03-08 NOTE — Telephone Encounter (Signed)
-----   Message from Nicola Police, MD sent at 03/08/2021 10:13 AM EST -----  Regarding: ID  Can you please ask [name] to get clearance from his ID doctor for surgery given his HIV?  They may not even need to see him, but I just want to make sure they know he is going to surgery and see if they have any specific peri-operative recommendations.

## 2021-03-08 NOTE — Progress Notes (Signed)
SURGICAL ONCOLOGY PROGRESS NOTE    HPI: [name] is a 76 y.o. male with PMH of prostate cancer s/p radiation (2017), HIV controlled with Genvoya, minimal viral load and CD 4 count > 500, s/p colectomy for diverticular perforation (2006), and recent hospital admission for septic arthrtis of the hip and DVT who is referred by Dr. Milinda Cave for consultation of a pancreatic mass in the distal tail. The patient noted that the 3 small pancreatic masses in the distal tail was first discovered incidentally post op of his colectomy back in 2006. The patient noted years later (2017) for the treatment of his prostate cancer that his pancreatic mass showed that mass grew into a single 7 cm mass that was found incidently CTAP. 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 The patient also a FNA in 2017 which showed begingn etiology, as well as radiology reports from the [place] in [place] during that time.  Since we were unable to find cyst fluid analysis, we opted to send him for repeat EUS and biopsy.  He had this performed and now follows up to discuss the results.    SUBJECTIVE:  He is doing well at this time without complaints.  He tolerated the biopsy with no issues.     OBJECTIVE:  Vitals:    03/04/21 1407   BP: 139/86   Pulse: (!) 106   Resp: 18   Temp: 36.4 C (97.5 F)   SpO2: 95%   Weight: 78.9 kg (174 lb)   Height: 1.753 m (5\' 9" )   BMI: 25.75   A&O, NAD  RRR  No resp distress  Abdomen with midline scars  No lower extremity edema    EUS:  PANCREAS: A single 61 mm x 38 mm oval, microcystic septated lesion with multiple   anechoic cystic spaces with areas of honeycombing suggestive of serous   cystadenoma was found in the tail of the  pancreas. There was no mural nodule   noted within the cyst. There was no communication with the pancreatic duct.   The pancreatic duct was nondilated. A fine-needle aspiration of the cyst using   a 10 cc syringe suction was performed with a 19 gauge needle. Minimal fluid   was aspirated with collapse of cyst. The fluid was sent for carcinoembryonic   antigen, amylase and DNA analysis. Fine-needle biopsy was also performed with   a 19 gauge needle. Three passes were performed. Doppler ultrasound was   replaced to avoid intervening blood vessels.   Another 16 mm x 15 mm round, anechoic cyst was found in the head of the   pancreas. There were no mural nodules noted within the cyst. There was no   communication with the pancreatic duct. The pancreatic duct was nondilated.   BILIARY SYSTEM: The biliary system was non-dilated and had no endosonographic   abnormalities.   AMPULLA: The ampulla had no endosonographic abnormalities.   LIVER: There were no endosonographic abnormalities in the visualized portion of   the liver.   CHEST: The visualized mediastinum was normal. The scope was then withdrawn from   the patient and the procedure terminated.   COMPLICATIONS: There were no complications   ENDOSCOPIC VISUALIZATION:   Esophagus was visualized.  Stomach  was visualized.  Duodenum was visualized.   ULTRASONIC VISUALIZATION:   EUS visualization was complete.   ENDOSCOPIC IMPRESSIONS:   1. 61 x 38 mm microcystic, multi septated pancreatic cystic lesion with areas   of central honecombing suggestive of Serous cystadenoma was seen in the tail of   the pancreas. Aspiration of fluid and fine-needle biopsy performed.   2. 16 mm x 15 mm pancreatic cyst seen in the head of the pancreas.   3. No mural nodules or PD communication noted     Path:  A. Pancreatic cyst, tail, endoscopic ultrasound-guided fine needle aspiration, cytology with cell block:  Satisfactory for evaluation.  NEOPLASTIC: OTHER    Mucinous cyst  fluid with low-grade atypia, see comment.      PANCREATIC TAIL CYST, BIOPSY:  -  Scant pancreatic parenchyma with fibrosis and mild chronic inflammation (See Comment).    Electronically signed by Rexene Edison, MD on 02/10/2021 at 1100     NGS: Mucinous fluid with no mutations    ASSESSMENT/PLAN:  76 yo M with longstanding pancreatic tail cystic lesion.  This was thought to represent a serous cystadenoma based on previous workup and imaging, however repeat EUS was performed to confirm.  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  Given the size and appearance, I think this is high risk for malignant degeneration and I offered him surgical resection.  I discussed the diagnosis, prognosis and approach to IPMN.  I discussed the indications, risks, benefits and alternatives to a robotic possible open distal pancreatectomy and splenectomy with risks including pancreatic leak, bleeding, infection and other complications associated with any major surgery including blood clots, heart attack, stroke, pneumonia or death.  I discussed that although controlled, his HIV might make him higher risk for complications. Given his extensive prior surgery, there is a strong chance we will have to perform an open procedure, however we will start laparoscopic/robotic to assess the degree of adhesions.  I would like to get clearance from his ID doctor prior to surgery. He was given splenectomy vaccines at today's visit.  He is on anticoagulation for right tibial vein thrombosis 3 months ago that we can likely hold, but we will discuss with Dr. Deforest Hoyles.  We will book him for surgery.  All his questions were answered and he agreed with the plan.     Aaron Edelman A. Cyndi Bender, MD, FACS

## 2021-03-08 NOTE — Telephone Encounter (Signed)
SURGICAL ONCOLOGY    CLEARANCE FOR [facility]       Patient: [name]  D.O.B.: 11-Jan-1946  MRN#: [id number]    Date of Request: 03/08/2021     A mutual patient will be scheduled by Dr. Cyndi Bender for surgery.     We are requesting that he have Infectious Disease clearance before he is put on our surgical schedule.     [facility] requests a written review of systems (not on a prescription pad).     Please write risk level of low, moderate or high based on your findings. We greatly appreciate all of your efforts in getting the patient ready for surgery.     Diagnosis: IPMN- pancreatic cyst   Date of Surgery:  TBD- possible 03/30/21  Procedure: distal pancreatectomy, splenectomy    *Please complete any testing or labs you feel necessary to medically clear this patient for surgery under general anesthesia .      PLEASE FAX CLEARANCE LETTER TO: [phone]  ATTN: Lambert Mody, RN  PLEASE CALL [phone] with any questions.

## 2021-03-10 LAB — ADULT ROUTINE BLOOD CULTURE, SET OF 2 BOTTLES (BACTERIA AND YEAST): BLOOD CULTURE, ROUTINE: NO GROWTH

## 2021-03-11 ENCOUNTER — Ambulatory Visit (HOSPITAL_BASED_OUTPATIENT_CLINIC_OR_DEPARTMENT_OTHER): Payer: Self-pay | Admitting: SURGICAL ONCOLOGY

## 2021-03-11 ENCOUNTER — Ambulatory Visit (HOSPITAL_COMMUNITY): Payer: Self-pay

## 2021-03-12 ENCOUNTER — Encounter (INDEPENDENT_AMBULATORY_CARE_PROVIDER_SITE_OTHER): Payer: Self-pay

## 2021-03-15 ENCOUNTER — Ambulatory Visit: Payer: 59 | Attending: SURGICAL ONCOLOGY

## 2021-03-15 ENCOUNTER — Ambulatory Visit (INDEPENDENT_AMBULATORY_CARE_PROVIDER_SITE_OTHER): Payer: Self-pay | Admitting: SURGICAL ONCOLOGY

## 2021-03-15 ENCOUNTER — Other Ambulatory Visit: Payer: Self-pay

## 2021-03-15 DIAGNOSIS — Z01812 Encounter for preprocedural laboratory examination: Secondary | ICD-10-CM | POA: Insufficient documentation

## 2021-03-15 LAB — COMPREHENSIVE METABOLIC PANEL, NON-FASTING
ALBUMIN: 3.4 g/dL (ref 3.4–4.8)
ALKALINE PHOSPHATASE: 94 U/L (ref 45–115)
ALT (SGPT): 75 U/L — ABNORMAL HIGH (ref 10–55)
ANION GAP: 14 mmol/L — ABNORMAL HIGH (ref 4–13)
AST (SGOT): 20 U/L (ref 8–45)
BILIRUBIN TOTAL: 0.6 mg/dL (ref 0.3–1.3)
BUN/CREA RATIO: 21 (ref 6–22)
BUN: 26 mg/dL — ABNORMAL HIGH (ref 8–25)
CALCIUM: 9 mg/dL (ref 8.8–10.2)
CHLORIDE: 99 mmol/L (ref 96–111)
CO2 TOTAL: 19 mmol/L — ABNORMAL LOW (ref 23–31)
CREATININE: 1.25 mg/dL (ref 0.75–1.35)
ESTIMATED GFR: 60 mL/min/BSA (ref >=60)
GLUCOSE: 412 mg/dL (ref 65–125)
POTASSIUM: 4.6 mmol/L (ref 3.5–5.1)
PROTEIN TOTAL: 7.3 g/dL (ref 5.6–7.6)
SODIUM: 132 mmol/L — ABNORMAL LOW (ref 136–145)

## 2021-03-15 LAB — TYPE AND SCREEN
ABO/RH(D): A NEG
ANTIBODY SCREEN: NEGATIVE

## 2021-03-15 LAB — CBC WITH DIFF
HCT: 44.7 % (ref 38.9–52.0)
HGB: 15.1 g/dL (ref 13.4–17.5)
MCH: 31.2 pg (ref 26.0–32.0)
MCHC: 33.8 g/dL (ref 31.0–35.5)
MCV: 92.4 fL (ref 78.0–100.0)
MPV: 10.5 fL (ref 8.7–12.5)
PLATELETS: 160 10*3/uL (ref 150–400)
RBC: 4.84 10*6/uL (ref 4.50–6.10)
RDW-CV: 15.8 % — ABNORMAL HIGH (ref 11.5–15.5)
WBC: 19.3 10*3/uL — ABNORMAL HIGH (ref 3.7–11.0)

## 2021-03-15 LAB — MANUAL DIFF AND MORPHOLOGY-SYSMEX
BASOPHIL #: <0.1 10*3/uL (ref <=0.20)
BASOPHIL %: 0 %
EOSINOPHIL #: <0.1 10*3/uL (ref <=0.50)
EOSINOPHIL %: 0 %
LYMPHOCYTE #: 3.09 10*3/uL (ref 1.00–4.80)
LYMPHOCYTE %: 15 %
MONOCYTE #: 0.19 10*3/uL — ABNORMAL LOW (ref 0.20–1.10)
MONOCYTE %: 1 %
NEUTROPHIL #: 16.02 10*3/uL — ABNORMAL HIGH (ref 1.50–7.70)
NEUTROPHIL %: 82 %
NEUTROPHIL BANDS %: 1 %
RBC MORPHOLOGY: NORMAL
REACTIVE LYMPHOCYTE %: 1 %

## 2021-03-15 LAB — PT/INR
INR: 0.88 (ref <=5.00)
PROTHROMBIN TIME: 10.3 seconds (ref 9.7–13.6)

## 2021-03-15 LAB — PREALBUMIN: PREALBUMIN: 45 mg/dL (ref 18.0–45.0)

## 2021-03-16 ENCOUNTER — Ambulatory Visit (INDEPENDENT_AMBULATORY_CARE_PROVIDER_SITE_OTHER): Payer: Self-pay | Admitting: SURGICAL ONCOLOGY

## 2021-03-16 DIAGNOSIS — Z01812 Encounter for preprocedural laboratory examination: Secondary | ICD-10-CM

## 2021-03-16 DIAGNOSIS — Z01818 Encounter for other preprocedural examination: Secondary | ICD-10-CM

## 2021-03-16 NOTE — Telephone Encounter (Signed)
Message  Received: Today  Nicola Police, MD  Hamilton Capri, RN  Blood glucose 412.     Please ask him to see his PCP/endocrinologist to tight up his glucose control before surgery.     Thanks

## 2021-03-17 ENCOUNTER — Other Ambulatory Visit: Payer: Self-pay

## 2021-03-17 ENCOUNTER — Other Ambulatory Visit: Payer: Medicare Other | Attending: SURGICAL ONCOLOGY

## 2021-03-17 DIAGNOSIS — Z01818 Encounter for other preprocedural examination: Secondary | ICD-10-CM | POA: Insufficient documentation

## 2021-03-17 DIAGNOSIS — Z01812 Encounter for preprocedural laboratory examination: Secondary | ICD-10-CM | POA: Insufficient documentation

## 2021-03-17 LAB — GLUCOSE FASTING: GLUCOSE FASTING: 165 mg/dL — ABNORMAL HIGH (ref 70–99)

## 2021-03-17 LAB — HGA1C (HEMOGLOBIN A1C WITH EST AVG GLUCOSE): HEMOGLOBIN A1C: 9.3 % — ABNORMAL HIGH (ref <5.7)

## 2021-03-18 ENCOUNTER — Telehealth (INDEPENDENT_AMBULATORY_CARE_PROVIDER_SITE_OTHER): Payer: Self-pay | Admitting: SURGICAL ONCOLOGY

## 2021-03-18 DIAGNOSIS — R7309 Other abnormal glucose: Secondary | ICD-10-CM

## 2021-03-18 DIAGNOSIS — R739 Hyperglycemia, unspecified: Secondary | ICD-10-CM

## 2021-03-18 NOTE — Telephone Encounter (Addendum)
HGB A1C- greater than 9. I called and advised pt that he needs to follow up with his PCP or endocrinology. He said that he wants to see endocrinology because no one has ever told him he has diabetes. He wants to get this managed. He verbalized understanding that he will need to have surgery cancelled for now and will be rescheduled after his glucose has been adequately controlled. I emphasized the importance of tight glucose control and the role that diabetes has on post operative wound healing. He verbalized understanding. He agreed to referral to endocrinology and surgery being postponed.  Lambert Mody, RN  Clinical Nurse Coordinator  Department of Surgery  P: [phone]

## 2021-03-19 ENCOUNTER — Ambulatory Visit (INDEPENDENT_AMBULATORY_CARE_PROVIDER_SITE_OTHER): Payer: Self-pay | Admitting: SURGICAL ONCOLOGY

## 2021-03-19 NOTE — Telephone Encounter (Signed)
Pt wants to proceed with surgery Monday. Confirmed.  Lambert Mody, RN  Clinical Nurse Coordinator  Department of Surgery  P: [phone]

## 2021-03-22 ENCOUNTER — Inpatient Hospital Stay (HOSPITAL_COMMUNITY): Payer: Medicare Other | Admitting: SURGICAL ONCOLOGY

## 2021-03-22 ENCOUNTER — Inpatient Hospital Stay (HOSPITAL_COMMUNITY): Payer: 59

## 2021-03-22 ENCOUNTER — Encounter (HOSPITAL_COMMUNITY)
Admission: RE | Disposition: A | Discharge status: Home / Self Care | Payer: Self-pay | Source: Ambulatory Visit | Attending: SURGICAL ONCOLOGY

## 2021-03-22 ENCOUNTER — Other Ambulatory Visit: Payer: Self-pay

## 2021-03-22 ENCOUNTER — Inpatient Hospital Stay (HOSPITAL_COMMUNITY): Payer: 59 | Admitting: Student in an Organized Health Care Education/Training Program

## 2021-03-22 ENCOUNTER — Inpatient Hospital Stay
Admission: RE | Admit: 2021-03-22 | Discharge: 2021-03-25 | DRG: 406 | Disposition: A | Discharge status: Home / Self Care | Payer: 59 | Source: Ambulatory Visit | Attending: SURGICAL ONCOLOGY | Admitting: SURGICAL ONCOLOGY

## 2021-03-22 ENCOUNTER — Encounter (HOSPITAL_COMMUNITY): Payer: Self-pay | Admitting: SURGICAL ONCOLOGY

## 2021-03-22 DIAGNOSIS — Z5331 Laparoscopic surgical procedure converted to open procedure: Secondary | ICD-10-CM

## 2021-03-22 DIAGNOSIS — E039 Hypothyroidism, unspecified: Secondary | ICD-10-CM | POA: Diagnosis present

## 2021-03-22 DIAGNOSIS — Z794 Long term (current) use of insulin: Secondary | ICD-10-CM

## 2021-03-22 DIAGNOSIS — K219 Gastro-esophageal reflux disease without esophagitis: Secondary | ICD-10-CM | POA: Diagnosis present

## 2021-03-22 DIAGNOSIS — E7800 Pure hypercholesterolemia, unspecified: Secondary | ICD-10-CM | POA: Diagnosis present

## 2021-03-22 DIAGNOSIS — K660 Peritoneal adhesions (postprocedural) (postinfection): Secondary | ICD-10-CM

## 2021-03-22 DIAGNOSIS — B20 Human immunodeficiency virus [HIV] disease: Secondary | ICD-10-CM | POA: Diagnosis present

## 2021-03-22 DIAGNOSIS — C252 Malignant neoplasm of tail of pancreas: Secondary | ICD-10-CM

## 2021-03-22 DIAGNOSIS — D136 Benign neoplasm of pancreas: Principal | ICD-10-CM | POA: Diagnosis present

## 2021-03-22 DIAGNOSIS — Z01818 Encounter for other preprocedural examination: Principal | ICD-10-CM

## 2021-03-22 DIAGNOSIS — Z9049 Acquired absence of other specified parts of digestive tract: Secondary | ICD-10-CM

## 2021-03-22 DIAGNOSIS — E1165 Type 2 diabetes mellitus with hyperglycemia: Secondary | ICD-10-CM | POA: Diagnosis present

## 2021-03-22 DIAGNOSIS — R109 Unspecified abdominal pain: Secondary | ICD-10-CM

## 2021-03-22 DIAGNOSIS — Z79890 Hormone replacement therapy (postmenopausal): Secondary | ICD-10-CM

## 2021-03-22 DIAGNOSIS — D490 Neoplasm of unspecified behavior of digestive system: Secondary | ICD-10-CM | POA: Diagnosis present

## 2021-03-22 DIAGNOSIS — Z90410 Acquired total absence of pancreas: Secondary | ICD-10-CM

## 2021-03-22 DIAGNOSIS — K862 Cyst of pancreas: Secondary | ICD-10-CM | POA: Diagnosis present

## 2021-03-22 DIAGNOSIS — I10 Essential (primary) hypertension: Secondary | ICD-10-CM | POA: Diagnosis present

## 2021-03-22 LAB — ARTERIAL BLOOD GAS/LACTATE/CO-OX/LYTES (NA/K/CA/CL/GLUC) (TEMP COMP)
%FIO2 (ARTERIAL): 44 %
%FIO2 (ARTERIAL): 37 %
(T) PCO2: 39 mm/Hg (ref 35.0–45.0)
(T) PCO2: 38 mm/Hg (ref 35.0–45.0)
(T) PO2: 112 mm/Hg — ABNORMAL HIGH (ref 72.0–100.0)
(T) PO2: 116 mm/Hg — ABNORMAL HIGH (ref 72.0–100.0)
BASE DEFICIT: 3.2 mmol/L — ABNORMAL HIGH (ref 0.0–3.0)
BASE DEFICIT: 3.6 mmol/L — ABNORMAL HIGH (ref 0.0–3.0)
BICARBONATE (ARTERIAL): 22.5 mmol/L (ref 18.0–26.0)
BICARBONATE (ARTERIAL): 22.1 mmol/L (ref 18.0–26.0)
CARBOXYHEMOGLOBIN: 1.3 % (ref 0.0–2.5)
CARBOXYHEMOGLOBIN: 1.8 % (ref 0.0–2.5)
CHLORIDE: 105 mmol/L (ref 101–111)
CHLORIDE: 106 mmol/L (ref 101–111)
GLUCOSE: 147 mg/dL — ABNORMAL HIGH (ref 60–105)
GLUCOSE: 202 mg/dL — ABNORMAL HIGH (ref 60–105)
HEMATOCRITRT: 33 %
HEMATOCRITRT: 33 %
HEMOGLOBIN: 11 g/dL — ABNORMAL LOW (ref 12.0–18.0)
HEMOGLOBIN: 11.1 g/dL — ABNORMAL LOW (ref 12.0–18.0)
IONIZED CALCIUM: 1.18 mmol/L (ref 1.10–1.35)
IONIZED CALCIUM: 1.15 mmol/L (ref 1.10–1.35)
LACTATE: 0.8 mmol/L (ref 0.0–1.3)
LACTATE: 1 mmol/L (ref 0.0–1.3)
MET-HEMOGLOBIN: 0.3 % (ref 0.0–2.0)
MET-HEMOGLOBIN: 0.4 % (ref 0.0–2.0)
O2CT: 15.3 % — ABNORMAL LOW (ref 15.7–24.3)
O2CT: 15.5 % — ABNORMAL LOW (ref 15.7–24.3)
OXYHEMOGLOBIN: 97.6 % (ref 85.0–98.0)
OXYHEMOGLOBIN: 97.8 % (ref 85.0–98.0)
PAO2/FIO2 RATIO: 257 (ref <=200)
PAO2/FIO2 RATIO: 316 (ref <=200)
PCO2 (ARTERIAL): 39 mm/Hg (ref 35–45)
PCO2 (ARTERIAL): 38 mm/Hg (ref 35–45)
PH (ARTERIAL): 7.36 (ref 7.35–7.45)
PH (ARTERIAL): 7.36 (ref 7.35–7.45)
PH (T): 7.36 (ref 7.35–7.45)
PH (T): 7.36 (ref 7.35–7.45)
PO2 (ARTERIAL): 113 mm/Hg — ABNORMAL HIGH (ref 72–100)
PO2 (ARTERIAL): 117 mm/Hg — ABNORMAL HIGH (ref 72–100)
SODIUM: 135 mmol/L — ABNORMAL LOW (ref 137–145)
SODIUM: 135 mmol/L — ABNORMAL LOW (ref 137–145)
TEMPERATURE, COMP: 36.9 C (ref 15.0–40.0)
TEMPERATURE, COMP: 36.9 C (ref 15.0–40.0)
WHOLE BLOOD POTASSIUM: 3.2 mmol/L — ABNORMAL LOW (ref 3.5–4.6)
WHOLE BLOOD POTASSIUM: 3.6 mmol/L (ref 3.5–4.6)

## 2021-03-22 LAB — BPAM PACKED CELL ORDER
UNIT DIVISION: 0
UNIT DIVISION: 0

## 2021-03-22 LAB — TYPE AND CROSS RED CELLS - UNITS
ABO/RH(D): A NEG
ANTIBODY SCREEN: NEGATIVE
UNITS ORDERED: 2

## 2021-03-22 LAB — POC BLOOD GLUCOSE (RESULTS)
GLUCOSE, POC: 200 mg/dl — ABNORMAL HIGH (ref 70–105)
GLUCOSE, POC: 202 mg/dl — ABNORMAL HIGH (ref 70–105)
GLUCOSE, POC: 177 mg/dl — ABNORMAL HIGH (ref 70–105)

## 2021-03-22 SURGERY — ROBOTIC DISTIAL PANCREATECTOMY
Anesthesia: General | Wound class: Clean Contaminated Wounds-The respiratory, GI, Genital, or urinary

## 2021-03-22 MED ORDER — INSULIN GLARGINE-YFGN (U-100) 100 UNIT/ML SUBCUTANEOUS SOLUTION
15.0000 [IU] | Freq: Every evening | SUBCUTANEOUS | Status: DC
Start: 2021-03-22 — End: 2021-03-25
  Administered 2021-03-22: 0 [IU] via SUBCUTANEOUS
  Administered 2021-03-23 – 2021-03-24 (×2): 15 [IU] via SUBCUTANEOUS
  Filled 2021-03-22 (×4): qty 15

## 2021-03-22 MED ORDER — SODIUM CHLORIDE 0.9 % (FLUSH) INJECTION SYRINGE
2.0000 mL | INJECTION | INTRAMUSCULAR | Status: DC | PRN
Start: 2021-03-22 — End: 2021-03-22

## 2021-03-22 MED ORDER — HYDROMORPHONE 1 MG/ML INJECTION WRAPPER
INJECTION | Freq: Once | INTRAMUSCULAR | Status: DC | PRN
Start: 2021-03-22 — End: 2021-03-22
  Administered 2021-03-22 (×2): .2 mg via INTRAVENOUS
  Administered 2021-03-22: .4 mg via INTRAVENOUS
  Administered 2021-03-22: .2 mg via INTRAVENOUS

## 2021-03-22 MED ORDER — CELECOXIB 200 MG CAPSULE
200.0000 mg | ORAL_CAPSULE | Freq: Once | ORAL | Status: AC
Start: 2021-03-22 — End: 2021-03-22
  Administered 2021-03-22: 200 mg via ORAL
  Filled 2021-03-22: qty 1

## 2021-03-22 MED ORDER — DEXTROSE 5% IN WATER (D5W) FLUSH BAG - 250 ML
INTRAVENOUS | Status: DC | PRN
Start: 2021-03-22 — End: 2021-03-22

## 2021-03-22 MED ORDER — SODIUM CHLORIDE 0.9% FLUSH BAG - 250 ML
INTRAVENOUS | Status: DC | PRN
Start: 2021-03-22 — End: 2021-03-22

## 2021-03-22 MED ORDER — EPHEDRINE SULFATE 50 MG/ML INTRAVENOUS SOLUTION
Freq: Once | INTRAVENOUS | Status: DC | PRN
Start: 2021-03-22 — End: 2021-03-22
  Administered 2021-03-22: 5 mg via INTRAVENOUS

## 2021-03-22 MED ORDER — WATER FOR IRRIGATION, STERILE SOLUTION
1000.0000 mL | Status: DC | PRN
Start: 2021-03-22 — End: 2021-03-23
  Administered 2021-03-22: 3000 mL

## 2021-03-22 MED ORDER — BUPIVACAINE (PF) 0.5 % (5 MG/ML) INJECTION SOLUTION
30.0000 mL | Freq: Once | INTRAMUSCULAR | Status: DC | PRN
Start: 2021-03-22 — End: 2021-03-22
  Administered 2021-03-22: 30 mL via INTRAMUSCULAR

## 2021-03-22 MED ORDER — NITROGLYCERIN 50 MCG/ML IV DILUTION - FOR ANES
INJECTION | Freq: Once | INTRAVENOUS | Status: DC | PRN
Start: 2021-03-22 — End: 2021-03-22
  Administered 2021-03-22: 50 ug via INTRAVENOUS

## 2021-03-22 MED ORDER — SENNOSIDES 8.6 MG-DOCUSATE SODIUM 50 MG TABLET
2.0000 | ORAL_TABLET | Freq: Two times a day (BID) | ORAL | Status: DC
Start: 2021-03-22 — End: 2021-03-25
  Administered 2021-03-22 – 2021-03-25 (×6): 2 via ORAL
  Filled 2021-03-22 (×6): qty 2

## 2021-03-22 MED ORDER — LIDOCAINE (PF) 100 MG/5 ML (2 %) INTRAVENOUS SYRINGE
INJECTION | Freq: Once | INTRAVENOUS | Status: DC | PRN
Start: 2021-03-22 — End: 2021-03-22
  Administered 2021-03-22: 80 mg via INTRAVENOUS

## 2021-03-22 MED ORDER — SODIUM CHLORIDE 0.9 % INTRAVENOUS SOLUTION
INTRAVENOUS | Status: DC | PRN
Start: 2021-03-22 — End: 2021-03-22
  Administered 2021-03-22: 0 via INTRAVENOUS

## 2021-03-22 MED ORDER — HYDROMORPHONE 1 MG/ML INJECTION WRAPPER
0.2000 mg | INJECTION | INTRAMUSCULAR | Status: DC | PRN
Start: 2021-03-22 — End: 2021-03-22

## 2021-03-22 MED ORDER — MIDAZOLAM 1 MG/ML INJECTION SOLUTION
2.0000 mg | Freq: Once | INTRAMUSCULAR | Status: DC | PRN
Start: 2021-03-22 — End: 2021-03-22
  Administered 2021-03-22: 2 mg via INTRAVENOUS
  Filled 2021-03-22: qty 2

## 2021-03-22 MED ORDER — HYDROMORPHONE 1 MG/ML INJECTION WRAPPER
0.4000 mg | INJECTION | INTRAMUSCULAR | Status: DC | PRN
Start: 2021-03-22 — End: 2021-03-22
  Administered 2021-03-22 (×4): 0.4 mg via INTRAVENOUS
  Filled 2021-03-22 (×2): qty 1

## 2021-03-22 MED ORDER — DEXAMETHASONE SODIUM PHOSPHATE 4 MG/ML INJECTION SOLUTION
Freq: Once | INTRAMUSCULAR | Status: DC | PRN
Start: 2021-03-22 — End: 2021-03-22
  Administered 2021-03-22: 4 mg via INTRAVENOUS

## 2021-03-22 MED ORDER — SODIUM CHLORIDE 0.9 % (FLUSH) INJECTION SYRINGE
2.0000 mL | INJECTION | Freq: Three times a day (TID) | INTRAMUSCULAR | Status: DC
Start: 2021-03-22 — End: 2021-03-22

## 2021-03-22 MED ORDER — GABAPENTIN 300 MG CAPSULE
300.0000 mg | ORAL_CAPSULE | Freq: Two times a day (BID) | ORAL | Status: DC
Start: 2021-03-22 — End: 2021-03-25
  Administered 2021-03-22 – 2021-03-25 (×6): 300 mg via ORAL
  Filled 2021-03-22 (×6): qty 1

## 2021-03-22 MED ORDER — FENTANYL (PF) 50 MCG/ML INJECTION SOLUTION
100.0000 ug | Freq: Once | INTRAMUSCULAR | Status: DC | PRN
Start: 2021-03-22 — End: 2021-03-22
  Administered 2021-03-22 (×2): 50 ug via INTRAVENOUS
  Filled 2021-03-22: qty 2

## 2021-03-22 MED ORDER — KETAMINE 10 MG/ML INJECTION WRAPPER
INTRAMUSCULAR | Status: AC
Start: 2021-03-22 — End: 2021-03-22
  Filled 2021-03-22: qty 5

## 2021-03-22 MED ORDER — SODIUM CHLORIDE 0.9 % (FLUSH) INJECTION SYRINGE
2.0000 mL | INJECTION | Freq: Three times a day (TID) | INTRAMUSCULAR | Status: DC
Start: 2021-03-22 — End: 2021-03-22
  Administered 2021-03-22: 6 mL
  Administered 2021-03-22: 0 mL

## 2021-03-22 MED ORDER — LACTATED RINGERS INTRAVENOUS SOLUTION
INTRAVENOUS | Status: DC
Start: 2021-03-22 — End: 2021-03-22

## 2021-03-22 MED ORDER — ROCURONIUM 10 MG/ML INTRAVENOUS SOLUTION
Freq: Once | INTRAVENOUS | Status: DC | PRN
Start: 2021-03-22 — End: 2021-03-22
  Administered 2021-03-22 (×2): 10 mg via INTRAVENOUS
  Administered 2021-03-22: 80 mg via INTRAVENOUS
  Administered 2021-03-22: 10 mg via INTRAVENOUS

## 2021-03-22 MED ORDER — ELECTROLYTE-A INTRAVENOUS SOLUTION
INTRAVENOUS | Status: DC | PRN
Start: 2021-03-22 — End: 2021-03-22

## 2021-03-22 MED ORDER — FENTANYL (PF) 50 MCG/ML INJECTION SOLUTION
INTRAMUSCULAR | Status: AC
Start: 2021-03-22 — End: 2021-03-22
  Filled 2021-03-22: qty 2

## 2021-03-22 MED ORDER — OXYCODONE 5 MG TABLET
5.0000 mg | ORAL_TABLET | ORAL | Status: DC | PRN
Start: 2021-03-22 — End: 2021-03-25
  Administered 2021-03-23 (×2): 5 mg via ORAL
  Filled 2021-03-22 (×2): qty 1

## 2021-03-22 MED ORDER — HEPARIN (PORCINE) 5,000 UNIT/ML INJECTION SOLUTION
5000.0000 [IU] | Freq: Three times a day (TID) | INTRAMUSCULAR | Status: DC
Start: 2021-03-23 — End: 2021-03-25
  Administered 2021-03-23 – 2021-03-24 (×6): 5000 [IU] via SUBCUTANEOUS
  Administered 2021-03-25: 0 [IU] via SUBCUTANEOUS
  Administered 2021-03-25: 5000 [IU] via SUBCUTANEOUS
  Filled 2021-03-22 (×7): qty 1

## 2021-03-22 MED ORDER — INSULIN LISPRO 100 UNIT/ML SUB-Q SSIP
0.0000 [IU] | INJECTION | Freq: Four times a day (QID) | SUBCUTANEOUS | Status: DC | PRN
Start: 2021-03-22 — End: 2021-03-25
  Administered 2021-03-22 – 2021-03-23 (×2): 4 [IU] via SUBCUTANEOUS
  Administered 2021-03-23 (×2): 2 [IU] via SUBCUTANEOUS
  Administered 2021-03-23: 0 [IU] via SUBCUTANEOUS
  Administered 2021-03-24 (×3): 2 [IU] via SUBCUTANEOUS
  Administered 2021-03-25: 4 [IU] via SUBCUTANEOUS
  Filled 2021-03-22: qty 12
  Filled 2021-03-22 (×2): qty 6
  Filled 2021-03-22: qty 12
  Filled 2021-03-22 (×3): qty 6

## 2021-03-22 MED ORDER — ACETAMINOPHEN 325 MG TABLET
975.0000 mg | ORAL_TABLET | Freq: Four times a day (QID) | ORAL | Status: DC
Start: 2021-03-22 — End: 2021-03-25
  Administered 2021-03-22: 975 mg via ORAL
  Administered 2021-03-23: 0 mg via ORAL
  Administered 2021-03-23 – 2021-03-25 (×9): 975 mg via ORAL
  Filled 2021-03-22 (×4): qty 3
  Filled 2021-03-22: qty 1
  Filled 2021-03-22 (×7): qty 3

## 2021-03-22 MED ORDER — ELECTROLYTE-A INTRAVENOUS SOLUTION
INTRAVENOUS | Status: DC
Start: 2021-03-22 — End: 2021-03-23
  Administered 2021-03-23: 0 mL via INTRAVENOUS

## 2021-03-22 MED ORDER — CETIRIZINE 10 MG TABLET
10.0000 mg | ORAL_TABLET | Freq: Every day | ORAL | Status: DC
Start: 2021-03-23 — End: 2021-03-25
  Administered 2021-03-23 – 2021-03-25 (×3): 10 mg via ORAL
  Filled 2021-03-22 (×3): qty 1

## 2021-03-22 MED ORDER — SODIUM CHLORIDE 0.9 % (FLUSH) INJECTION SYRINGE
20.0000 mL | INJECTION | Freq: Once | INTRAMUSCULAR | Status: DC | PRN
Start: 2021-03-22 — End: 2021-03-22

## 2021-03-22 MED ORDER — LIDOCAINE (PF) 40 MG/ML (4 %) INJECTION (NON RT CONFIG)
Status: DC
Start: 2021-03-22 — End: 2021-03-24
  Filled 2021-03-22 (×2): qty 25

## 2021-03-22 MED ORDER — ONDANSETRON HCL (PF) 4 MG/2 ML INJECTION SOLUTION
4.0000 mg | Freq: Four times a day (QID) | INTRAMUSCULAR | Status: DC | PRN
Start: 2021-03-22 — End: 2021-03-25

## 2021-03-22 MED ORDER — PROPOFOL 10 MG/ML IV BOLUS
INJECTION | Freq: Once | INTRAVENOUS | Status: DC | PRN
Start: 2021-03-22 — End: 2021-03-22
  Administered 2021-03-22: 30 mg via INTRAVENOUS
  Administered 2021-03-22: 20 mg via INTRAVENOUS
  Administered 2021-03-22: 30 mg via INTRAVENOUS
  Administered 2021-03-22: 120 mg via INTRAVENOUS

## 2021-03-22 MED ORDER — ACETAMINOPHEN 325 MG TABLET
975.0000 mg | ORAL_TABLET | Freq: Once | ORAL | Status: AC
Start: 2021-03-22 — End: 2021-03-22
  Administered 2021-03-22: 975 mg via ORAL
  Filled 2021-03-22: qty 3

## 2021-03-22 MED ORDER — FENTANYL (PF) 50 MCG/ML INJECTION SOLUTION
Freq: Once | INTRAMUSCULAR | Status: DC | PRN
Start: 2021-03-22 — End: 2021-03-22
  Administered 2021-03-22: 100 ug via INTRAVENOUS

## 2021-03-22 MED ORDER — AMLODIPINE 5 MG TABLET
5.0000 mg | ORAL_TABLET | Freq: Every evening | ORAL | Status: DC
Start: 2021-03-22 — End: 2021-03-25
  Administered 2021-03-22 – 2021-03-24 (×3): 0 mg via ORAL

## 2021-03-22 MED ORDER — SODIUM CHLORIDE 0.9 % INTRAVENOUS SOLUTION
0.1500 mg | Freq: Once | INTRAVENOUS | Status: AC
Start: 2021-03-22 — End: 2021-03-22
  Administered 2021-03-22: 0.15 mg via INTRATHECAL
  Filled 2021-03-22: qty 0.15

## 2021-03-22 MED ORDER — PANTOPRAZOLE 40 MG TABLET,DELAYED RELEASE
40.0000 mg | DELAYED_RELEASE_TABLET | Freq: Two times a day (BID) | ORAL | Status: DC
Start: 2021-03-22 — End: 2021-03-22
  Administered 2021-03-22: 40 mg via ORAL
  Filled 2021-03-22: qty 1

## 2021-03-22 MED ORDER — PANTOPRAZOLE 40 MG TABLET,DELAYED RELEASE
40.0000 mg | DELAYED_RELEASE_TABLET | Freq: Every day | ORAL | Status: DC
Start: 2021-03-23 — End: 2021-03-25
  Administered 2021-03-23 – 2021-03-25 (×3): 40 mg via ORAL
  Filled 2021-03-22 (×3): qty 1

## 2021-03-22 MED ORDER — INSULIN LISPRO (U-100) 100 UNIT/ML SUBCUTANEOUS SOLUTION
2.0000 [IU] | Freq: Three times a day (TID) | SUBCUTANEOUS | Status: DC
Start: 2021-03-23 — End: 2021-03-24
  Administered 2021-03-23: 0 [IU] via SUBCUTANEOUS
  Administered 2021-03-23 – 2021-03-24 (×3): 2 [IU] via SUBCUTANEOUS
  Filled 2021-03-22 (×3): qty 6

## 2021-03-22 MED ORDER — DEXMEDETOMIDINE 4 MCG/ML IV DILUTION
Freq: Once | INTRAMUSCULAR | Status: DC | PRN
Start: 2021-03-22 — End: 2021-03-22
  Administered 2021-03-22 (×5): 8 ug via INTRAVENOUS

## 2021-03-22 MED ORDER — ONDANSETRON HCL (PF) 4 MG/2 ML INJECTION SOLUTION
Freq: Once | INTRAMUSCULAR | Status: DC | PRN
Start: 2021-03-22 — End: 2021-03-22
  Administered 2021-03-22: 4 mg via INTRAVENOUS

## 2021-03-22 MED ORDER — LACTATED RINGERS INTRAVENOUS SOLUTION
INTRAVENOUS | Status: DC | PRN
Start: 2021-03-22 — End: 2021-03-22

## 2021-03-22 MED ORDER — MAGNESIUM OXIDE 400 MG (241.3 MG MAGNESIUM) TABLET
400.0000 mg | ORAL_TABLET | Freq: Two times a day (BID) | ORAL | Status: AC
Start: 2021-03-22 — End: 2021-03-25
  Administered 2021-03-22 – 2021-03-25 (×6): 400 mg via ORAL
  Filled 2021-03-22 (×6): qty 1

## 2021-03-22 MED ORDER — DEXTROSE 5 % IN WATER (D5W) INTRAVENOUS SOLUTION
2.0000 g | Freq: Once | INTRAVENOUS | Status: AC
Start: 2021-03-22 — End: 2021-03-22
  Administered 2021-03-22: 2 g via INTRAVENOUS
  Filled 2021-03-22: qty 20

## 2021-03-22 MED ORDER — HYDROMORPHONE 1 MG/ML INJECTION WRAPPER
INJECTION | INTRAMUSCULAR | Status: AC
Start: 2021-03-22 — End: 2021-03-22
  Filled 2021-03-22: qty 1

## 2021-03-22 MED ORDER — LEVOTHYROXINE 75 MCG TABLET
75.0000 ug | ORAL_TABLET | Freq: Every morning | ORAL | Status: DC
Start: 2021-03-23 — End: 2021-03-25
  Administered 2021-03-23 – 2021-03-25 (×3): 75 ug via ORAL
  Filled 2021-03-22 (×3): qty 1

## 2021-03-22 MED ORDER — METRONIDAZOLE 500 MG/100 ML IN SODIUM CHLOR(ISO) INTRAVENOUS PIGGYBACK
500.0000 mg | INJECTION | Freq: Once | INTRAVENOUS | Status: AC
Start: 2021-03-22 — End: 2021-03-22
  Administered 2021-03-22: 500 mg via INTRAVENOUS
  Filled 2021-03-22: qty 100

## 2021-03-22 MED ORDER — DEXTROSE 5% IN WATER (D5W) FLUSH BAG - 250 ML
INTRAVENOUS | Status: DC | PRN
Start: 2021-03-22 — End: 2021-03-25

## 2021-03-22 MED ORDER — ATORVASTATIN 40 MG TABLET
40.0000 mg | ORAL_TABLET | Freq: Every day | ORAL | Status: DC
Start: 2021-03-22 — End: 2021-03-25
  Administered 2021-03-22 – 2021-03-24 (×3): 40 mg via ORAL
  Filled 2021-03-22 (×3): qty 1

## 2021-03-22 MED ORDER — SODIUM CHLORIDE 0.9 % (FLUSH) INJECTION SYRINGE
2.0000 mL | INJECTION | Freq: Three times a day (TID) | INTRAMUSCULAR | Status: DC
Start: 2021-03-22 — End: 2021-03-25
  Administered 2021-03-22 (×2): 0 mL
  Administered 2021-03-23: 5 mL
  Administered 2021-03-23 (×2): 0 mL
  Administered 2021-03-24: 5 mL
  Administered 2021-03-24: 6 mL
  Administered 2021-03-24: 0 mL
  Administered 2021-03-25: 6 mL
  Administered 2021-03-25: 0 mL

## 2021-03-22 MED ORDER — OXYCODONE 5 MG TABLET
10.0000 mg | ORAL_TABLET | ORAL | Status: DC | PRN
Start: 2021-03-22 — End: 2021-03-25
  Administered 2021-03-22 – 2021-03-25 (×7): 10 mg via ORAL
  Filled 2021-03-22 (×7): qty 2

## 2021-03-22 MED ORDER — KETAMINE 10 MG/ML INJECTION WRAPPER
Freq: Once | INTRAMUSCULAR | Status: DC | PRN
Start: 2021-03-22 — End: 2021-03-22
  Administered 2021-03-22: 20 mg via INTRAVENOUS
  Administered 2021-03-22 (×3): 10 mg via INTRAVENOUS

## 2021-03-22 MED ORDER — POLYETHYLENE GLYCOL 3350 17 GRAM ORAL POWDER PACKET
17.0000 g | Freq: Two times a day (BID) | ORAL | Status: DC
Start: 2021-03-22 — End: 2021-03-25
  Administered 2021-03-22: 0 g via ORAL
  Administered 2021-03-23 – 2021-03-25 (×5): 17 g via ORAL
  Filled 2021-03-22 (×6): qty 1

## 2021-03-22 MED ORDER — SUGAMMADEX 100 MG/ML INTRAVENOUS SOLUTION
INTRAVENOUS | Status: AC
Start: 2021-03-22 — End: 2021-03-22
  Filled 2021-03-22: qty 5

## 2021-03-22 MED ORDER — SODIUM CHLORIDE 0.9 % INTRAVENOUS SOLUTION
INTRAVENOUS | Status: DC
Start: 2021-03-22 — End: 2021-03-23
  Filled 2021-03-22: qty 25

## 2021-03-22 MED ORDER — SUGAMMADEX 100 MG/ML INTRAVENOUS SOLUTION
Freq: Once | INTRAVENOUS | Status: DC | PRN
Start: 2021-03-22 — End: 2021-03-22
  Administered 2021-03-22: 500 mg via INTRAVENOUS

## 2021-03-22 SURGICAL SUPPLY — 131 items
ADHESIVE TISSUE EXOFIN .5ML_PREMIERPRO EXOFIN MICRO HV (SUTURE/WOUND CLOSURE)
APPL 70% ISPRP 2% CHG 26ML 13._2X13.2IN CHLRPRP PREP DEHP-FR (MED SURG SUPPLIES) ×1
APPL 70% ISPRP 2% CHG 26ML CHLRPRP HI-LT ORNG PREP STRL LF  DISP CLR (MED SURG SUPPLIES) ×1 IMPLANT
APPLIER E-CLP III SUP INTLK 33CM 5MM PSTL GRIP GLARE RST SAF INTLK HNDL 16 CLIP TI MED LRG INTERNAL (WOUND CARE SUPPLY) IMPLANT
APPLIER E-CLP III SUP INTLK 33_CM 5MM PSTL GRIP GLARE RST SAF (WOUND CARE/ENTEROSTOMAL SUPPLY)
APPLIER E-CLP2 SUP INTLK 29CM 10MM PSTL GRIP GLARE RST SAF INTLK HNDL 20 ML CLIP TI INTERNAL CLIP (WOUND CARE SUPPLY) IMPLANT
APPLIER E-CLP2 SUP INTLK 29CM_10MM PSTL GRIP GLARE RST SAF (WOUND CARE/ENTEROSTOMAL SUPPLY)
BAG 28X36IN BAND EQP (DRAPE/PACKS/SHEETS/OR TOWEL) ×1 IMPLANT
BOOT SUT STRL LF  DISP YW (SUTURE/WOUND CLOSURE) ×1 IMPLANT
BOOT SUT STRL LF DISP YW (SUTURE/WOUND CLOSURE) ×1
CABLE CATH 72IN CYROCATH CRYOABLATION COAX UMB STRL DISP (IV TUBING & ACCESSORIES) ×1 IMPLANT
CATH UMB 72IN COAX (IV TUBING & ACCESSORIES) ×1
CLOSURE SKIN STRIPS 1/2X4IN_R1547 6/PK 50PK/BX (WOUND CARE/ENTEROSTOMAL SUPPLY)
CONV USE 162472 - TRAY CATH 16FR 16IN 1 LYR DRAIN BAG FOLEY 2W SIL ELASTO ADULT 10ML LTX (UROLOGICAL SUPPLIES) ×1
CONV USE 338641 - PACK SURG LAPSCP NONST DISP LF (CUSTOM TRAYS & PACK) ×1 IMPLANT
CONV USE 339107 - TRAY CATH 16FR 16IN 1 LYR DRAIN BAG FOLEY 2W SIL ELASTO ADULT 10ML LTX (UROLOGICAL SUPPLIES) ×1
COVER ENDOSCP TIP HOT SHR DAVI_NCI ENDOWRIST 8MM STD CAUT (ROBOTIC SUPPLIES)
DDISCONTINUED USE 345070 - LOOP VESSEL MINI LF  YW RADOPQ SIL DVN DEV-O-LOOP STRL (PERFUSION/HEART SUPPLIES) ×1 IMPLANT
DEVICE ENDOS ENDO PNUT 5MM 45CM SWAB COTTON DISP (ENDOSCOPIC SUPPLIES)
DEVICE ENDOS ENDO PNUT 5MM 45C_M SWAB COTTON DISP (INSTRUMENTS ENDOMECHANICAL)
DISCONTINUED USE 329547 - SEAL ENDOS INSTR 5-8MM DAVINCI XI UNIV (ENDOSCOPIC SUPPLIES) ×2 IMPLANT
DISCONTINUED USE 330076 - ADH SKNCLS EXOFIN PREMIERPRO MICRO HVSC SFT FLXB APPL TUBE STRL TISS LF  DISP .5ML (SUTURE/WOUND CLOSURE) IMPLANT
DRAIN FLUTED 19FR BLAKE_2232 (MED SURG SUPPLIES) ×1
DRAIN INCS 19FR .25IN BLAK SIL 4 CHNL RADOPQ HBLS BEND TROCAR STRL RND DISP WHT (MED SURG SUPPLIES) ×1 IMPLANT
DRAPE 4FT BIG CA BCK TBL MODL 427 HVDTY OR SPEC LF  STRL DISP SURG (DRAPE/PACKS/SHEETS/OR TOWEL) ×1 IMPLANT
DRAPE 4FT BIG CA BCK TBL MODL_427 HVDTY OR SPEC LF STRL (DRAPE/PACKS/SHEETS/OR TOWEL) ×1
DRAPE ARM 21X19X10.5IN DAVINCI XI EQP 21LB (DRAPE/PACKS/SHEETS/OR TOWEL) IMPLANT
DRAPE ARM 21X19X10.5IN DAVINCI_XI EQP 21LB (DRAPE/PACKS/SHEETS/OR TOWEL)
DRAPE BND BAG 28X36IN SNAP KOV_ER EQP (DRAPE/PACKS/SHEETS/OR TOWEL) ×1
DRAPE CLMN DAVINCI XI EQP (DRAPE/PACKS/SHEETS/OR TOWEL) IMPLANT
DRAPE POUCH INSTRU 3 POCKET_1018L 40EA/CS (DRAPE/PACKS/SHEETS/OR TOWEL) ×1
DUPE USE ITEM 319409 - DRAPE BILAMINATE UTIL TAPE 26X_15IN LF DISP SURG (DRAPE/PACKS/SHEETS/OR TOWEL) IMPLANT
ELECTRODE ESURG BLADE 6.5IN EDGE LF (SURGICAL CUTTING SUPPLIES) ×1 IMPLANT
ELECTRODE ESURG FLAT LHOOK 36CM CLEANCOAT PTFE COAT LAPSCP (SURGICAL CUTTING SUPPLIES) IMPLANT
ELECTRODE ESURG FLAT LHOOK 36C_M CLEANCOAT COAT LAPSCP (CUTTING ELEMENTS)
ELECTRODE ESURG XTD BLADE 6.5I_N 3/32IN EDGE STRL DISP INSL (CUTTING ELEMENTS) ×1
ENDOCATCH 10MM SPEC POUCH_173050G 6EA/BX (INSTRUMENTS ENDOMECHANICAL)
ENDOCATCH 15MM SPECIMEN BAG_173049 3EA/BX (INSTRUMENTS ENDOMECHANICAL)
ENDOSCP MNTN CLEARIFY 8X6IN WA_RM HUB 2 CLTH TROCAR WP MRFBR (INSTRUMENTS ENDOMECHANICAL) ×1
EVACUATOR SMOKE LAP 8.0L/MIN_SC082500 PINK 10EA/BX (MED SURG SUPPLIES)
FILTER SMOKEVAC PNK SEECLEAR 15 MMHG LAPSCP MLSTG SYS PSV DISP (MED SURG SUPPLIES) IMPLANT
GARMENT COMPRESS MED CALF CENTAURA NYL VASOGRAD LTWT BRTHBL SEQ FIL BLU 18- IN (MED SURG SUPPLIES) ×1 IMPLANT
GARMENT COMPRESS MED CALF CENT_AURA NYL VASOGRAD LTWT BRTHBL (MED SURG SUPPLIES) ×1
GOWN SURG XL L3 NONREINFORCE HKLP CLSR SET IN SLEEVE STRL LF  DISP BLU SIRUS SMS 47IN (DRAPE/PACKS/SHEETS/OR TOWEL) ×1 IMPLANT
GOWN SURG XL L3 NONREINFORCE H_KLP CLSR SET IN SLEEVE STRL LF (DRAPE/PACKS/SHEETS/OR TOWEL) ×1
HANDPC SUCT MEDIVAC YANKAUER BLBS TIP CLR STRL LF  DISP (MED SURG SUPPLIES) ×1 IMPLANT
HANDPC SUCT MEDIVAC YANKAUER B_LBS TIP CLR STRL LF DISP (MED SURG SUPPLIES) ×2
HEMOSTAT ABS 8X4IN FLXB SHR WV_SRGCL STRL DISP (WOUND CARE SUPPLY) IMPLANT
HEMOSTAT ABS 8X4IN FLXB SHR WV_SRGCL STRL DISP (WOUND CARE/ENTEROSTOMAL SUPPLY)
IMG CLEARIFY 8X6IN WARM HUB TROCAR WIPE MRFBR SYSTEM DISP (ENDOSCOPIC SUPPLIES) ×1 IMPLANT
IRR SUCT STRKFL2 TIP STRL LF  DISP (ENDOSCOPIC SUPPLIES) IMPLANT
IRR SUCT STRKFL2 TIP STRL LF_DISP (INSTRUMENTS ENDOMECHANICAL)
LABEL E-Z STICK_STLEZP1 100EA/CS (MED SURG SUPPLIES) ×1
LABEL MED EZ PEEL MRKR LF (MED SURG SUPPLIES) ×1 IMPLANT
LEGGINGS SURG 43X29IN CUF PRXM_SMS STRL LF DISP (DRAPE/PACKS/SHEETS/OR TOWEL)
LEGGINGS SURG 48X31IN CUF PRXM SMS 6IN STRL LF  DISP (DRAPE/PACKS/SHEETS/OR TOWEL) IMPLANT
LOOP VESSEL MINI LF  YW RADOPQ SIL DVN DEV-O-LOOP STRL (PERFUSION/HEART SUPPLIES) ×1 IMPLANT
LOOP VESSEL MINI LF YW RADOPQ_SIL DVN DEV-O-LOOP STRL (PERFUSION/HEART SUPPLIES) ×1
MBO USE ITEM 130230 - DEVICE ENDOS ENDO PNUT 5MM 45CM SWAB COTTON DISP (ENDOSCOPIC SUPPLIES) IMPLANT
NEEDLE INSFL 100MM 14GA STEP PNMPRTN TROCAR BLDLS RADIAL EXPD SLEEVE BLUNT STY VRSTP + SS (ENDOSCOPIC SUPPLIES) IMPLANT
NEEDLE INSFL 100MM 14GA STEP P_NMPRTN TROCAR BLDLS RADIAL (INSTRUMENTS ENDOMECHANICAL)
OBTURATOR LAPSCP 8MM WECK VST BLDLS STRL LF  DISP (ROBOTIC SUPPLIES) ×1 IMPLANT
OBTURATOR LAPSCP 8MM WECK VST_BLDLS STRL LF DISP (ROBOTIC SUPPLIES) ×1
PACK LAPAROSCOPIC CUSTOM (CUSTOM TRAYS & PACK) ×1
PAD POSITIONING PIGAZZI XL (MED SURG SUPPLIES)
PAD XL 40X20X1IN POSITION THE PNK PAD 1 LFT SHEET BODY STRAP PERI CTOUT NONST DISP PIGAZZI (MED SURG SUPPLIES) IMPLANT
PLATFORM ACCESS 10MM 12MM GELPOINT GELSEAL ALEXIS MINI CAP PRTC 4 SLEEVE INTROD (ENDOSCOPIC SUPPLIES) IMPLANT
PLATFORM ACCESS 10MM 12MM GELP_OINT MINI ADV GELSEAL CAP (INSTRUMENTS ENDOMECHANICAL)
POUCH 18X10IN LONG 2 ADH STRP 3 CMPRT STRDRP INSTR PLASTIC STRL (DRAPE/PACKS/SHEETS/OR TOWEL) ×1 IMPLANT
POUCH SPEC RETR 34.5CM 10MM E-CTCH GLD POLYUR ERG HNDL LONG CYL TUBE 29.5CM LAPSCP LF  DISP (ENDOSCOPIC SUPPLIES) IMPLANT
POUCH SPEC RETR 34.5CM 15MM E-CTCH POLYUR FLXB LONG CYL TUBE 29.5CM STRL LF  DISP (ENDOSCOPIC SUPPLIES) IMPLANT
RELOAD ENDO 45 ARTIC VASC/MED_EGIA45AVM TRI STAPLE BX/6 TAN (SUTURE AIDS) ×1
RELOAD ENDO 60 ARTIC VASC MED_EGIA60AVM TRI STAPLE BX6 TAN (SUTURE AIDS)
RELOAD ENDO GIA ROTIC 45 3.5_EGIA45AMT SULU 6EA/BX (SUTURE AIDS)
RELOAD ENDO GIA ROTIC 60 3.5_EGIA60AMT SULU 6EA/BX (SUTURE AIDS)
RELOAD STPLR 2MM 2.5MM 3MM 45MM EGIA TI MED VAS TISS ARTC KNIFE BLADE LGR ANVIL BCKT STRL LF  DISP (SUTURE AIDS) ×1 IMPLANT
RELOAD STPLR 2MM 2.5MM 3MM 60MM EGIA TI MED VAS TISS ARTC KNIFE BLADE LGR ANVIL BCKT STRL LF  DISP (SUTURE AIDS) IMPLANT
RELOAD STPLR 2MM 45MM EGIA MED THKTIS REINF STRL LF  DISP SIGNIA STPL SYS (SUTURE AIDS) IMPLANT
RELOAD STPLR 2MM 45MM MED_THKTIS REINF STRL LF DISP (SUTURE AIDS)
RELOAD STPLR 3MM 3.5MM 4MM 45MM EGIA TI MED THKTIS ARTC KNIFE BLADE LGR ANVIL BCKT STRL LF  DISP (SUTURE AIDS) IMPLANT
RELOAD STPLR 60MM TI 3MM 3.5MM 4MM MED THKTIS ARTC KNIFE BLADE LGR ANVIL BCKT STRL LF  DISP EGIA (SUTURE AIDS) IMPLANT
RELOAD STPLR MED CURVE 30MM TRI-STPL 2 VAS STRL LF  TAN (SUTURE AIDS) ×1 IMPLANT
RELOAD STPLR MED CURVE 45MM TRI-STPL 2 VAS STRL LF  TAN (SUTURE AIDS) ×1 IMPLANT
RELOAD STPLR MED CURVE 60MM TRI-STPL 2 VAS STRL LF  TAN (SUTURE AIDS) IMPLANT
RELOAD STPLR MED THK 60MM TRI-STPL 2 REINF STRL LF  PURP (SUTURE AIDS) ×2 IMPLANT
RELOAD STPLR X THK 45MM TRI-STPL 2 REINF STRL LF  BLK (SUTURE AIDS) IMPLANT
RELOAD STPLR X THK 60MM EGIA REINF STRL LF  BLK (SUTURE AIDS) IMPLANT
RESERVOIR DRAIN SIL JP BULB 100CC STRL LF  DISP (MED SURG SUPPLIES) ×1 IMPLANT
RESERVOIR DRAIN SIL JP BULB 10_0CC STRL LF DISP (MED SURG SUPPLIES) ×1
SEAL ENDOS INSTR 5-8MM DAVINCI XI UNIV (INSTRUMENTS ENDOMECHANICAL) ×2
SEAL ENDOS INSTR 5-8MM DAVINCI_XI UNIV (INSTRUMENTS ENDOMECHANICAL) ×2
SEALDIVD LAPSCP 44CM LIGASURE 350D 18.5MM MARYLAND CURVE JAW NANO COAT 20.3MM (ENDOSCOPIC SUPPLIES) ×1 IMPLANT
SEALDIVD LAPSCP 44CM LIGASURE_350D 18.5MM MARYLAND CURVE JAW (INSTRUMENTS ENDOMECHANICAL) ×1
SEALER BIPOLAR 11.13MM 3MM END_OFB30 TRANSCOLLATION 10D CYL (INSTRUMENTS ENDOMECHANICAL)
SEALER ESURG 11.13MM 3MM ENDO FB3 10D FLOAT BALL CYL MONOPOL DSCT BLUNT ELECTRODE 317.5MM STRL LF (ENDOSCOPIC SUPPLIES) IMPLANT
SET TUBING PNEUMOCLEAR HIFLO SMOKE EVAC (MED SURG SUPPLIES) IMPLANT
SET TUBING PNEUMOCLEAR HIFLO S_MOKE EVAC DEMOTE (MED SURG SUPPLIES)
SPONGE LAP 18X4IN STD RADOPQ 4 PLY REINF ABS RFDETECT COTTON STRL LF  DISP (MED SURG SUPPLIES) ×3 IMPLANT
SPONGE LAP RFD 4INX18IN_L041804P01C1 40PK/CS (MED SURG SUPPLIES) ×3
STAPLER ENDO GIA ARTIC CVD 30_SIG30CTAVM VAS MED TAN 6EA/BX (SUTURE AIDS) ×1
STAPLER ENDO GIA SIGTRSB45AXT_XTHICK BLK 6EA/BX (SUTURE AIDS)
STAPLER ENDO GIA SIGTRSB60AMT_MED THICK PURP 6EA/BX (SUTURE AIDS) ×2
STAPLER ENDO GIA SIGTRSB60AXT_XTHICK BLK 6EA/BX (SUTURE AIDS)
STAPLER ENDO GIA UNIV 12MM_EGIAUSTND 3EA/BX (SUTURE AIDS) ×1
STAPLER ENDO RELOAD CVD 45MM_SIG45CTAVM MED 6EA/BX (SUTURE AIDS) ×1
STAPLER ENDO RELOAD CVD 60MM_SIG60CTAVM 6/BX (SUTURE AIDS)
STAPLER INTERNAL 16CMX4MM PVC STD UNIV TISS STRL LF  DISP EGIA ENDOS 12MM (SUTURE AIDS) ×1 IMPLANT
STAPLER SKIN 4.1X6.5MM 35 W STPL CART LF  APS U DISP CLR SS PLASTIC (SUTURE/WOUND CLOSURE) ×1 IMPLANT
STAPLER SKIN WIDE 35W_8886803712 12/BX (SUTURE/WOUND CLOSURE) ×2
STRIP 4X.5IN STRSTRP PLSTR REINF SKNCLS WHT STRL LF (WOUND CARE SUPPLY) IMPLANT
SYSTEM GELPORT HAND ACCESS_C8XX2 (ENDOSCOPIC SUPPLIES) IMPLANT
SYSTEM GELPORT HAND ACCESS_C8XX2 (INSTRUMENTS ENDOMECHANICAL)
TIP CAUT HOT SHR DAVINCI ENDOWRIST 8MM STD CAUT MONOPOL DISP (ROBOTIC SUPPLIES) IMPLANT
TOWEL SURG WHT 25X16IN RFDETECT COTTON RADOPQ RF DTBL STRL LF  DISP (MED SURG SUPPLIES) IMPLANT
TOWEL SURG WHT 25X16IN RFDETEC_T COTTON RADOPQ RF DTBL OR XRY (MED SURG SUPPLIES)
TRAY CATH 16FR 16IN 1 LYR DRAIN BAG FOLEY 2W SIL ELASTO ADULT 10ML LTX (UROLOGICAL SUPPLIES) ×1 IMPLANT
TRAY CATH 16FR 16IN 1 LYR DRAI_N BAG FOLEY 2W SIL ELASTO (UROLOGICAL SUPPLIES) ×1
TROCAR BLADELESS 12MM_6EA/BX DISP (INSTRUMENTS ENDOMECHANICAL)
TROCAR BLADELESS 5MM NONB5STF_6EA/BX DISP (INSTRUMENTS ENDOMECHANICAL) ×1
TROCAR BLADELESS 5MM ONB5STF_ONB5STF 6EA/BX (INSTRUMENTS ENDOMECHANICAL)
TROCAR LAPSCP 100MM 5MM KII FIOS Z THREAD SLEEVE 1ST ENTRY STRL LF  ACCESS SYS ABDOMINAL (ENDOSCOPIC SUPPLIES) IMPLANT
TROCAR LAPSCP LONG 150MM 12MM VERSAONE FIX CANN BLDLS DLPHN NOSE TIP OPTC STRL LF  DISP (ENDOSCOPIC SUPPLIES) IMPLANT
TROCAR LAPSCP STD 100MM 12MM VERSAONE FIX CANN BLDLS DLPHN NOSE TIP STRL LF  DISP (ENDOSCOPIC SUPPLIES) IMPLANT
TROCAR LAPSCP STD 100MM 5MM VERSAONE FIX CANN BLDLS DLPHN NOSE TIP OPTC STRL LF  DISP (ENDOSCOPIC SUPPLIES) IMPLANT
TROCAR LAPSCP STD 100MM 5MM VERSAONE FIX CANN BLDLS DLPHN NOSE TIP STRL LF  DISP (ENDOSCOPIC SUPPLIES) ×1 IMPLANT
TROCAR LAPSCP STD 15MM VRSTP + RADIAL EXPD SLEEVE CANN DIL STRL LF  DISP (ENDOSCOPIC SUPPLIES) IMPLANT
TROCAR OPTICAL 12MM LONG (INSTRUMENTS ENDOMECHANICAL)
TROCAR VERSASTEP PLUS 15MM_VS101015P 3EA/BX (INSTRUMENTS ENDOMECHANICAL)
TROCAR Z-THREAD KII 5X100MM_CTF03 (INSTRUMENTS ENDOMECHANICAL)
TUBING SUCT CLR 20FT 9/32IN MEDIVAC NCDTV M/M CONN STRL LF (MED SURG SUPPLIES) ×1 IMPLANT
TUBING SUCT CONN 20FT LONG_STRL N720A (MED SURG SUPPLIES) ×1

## 2021-03-22 NOTE — Anesthesia Preprocedure Evaluation (Signed)
ANESTHESIA PRE-OP EVALUATION  [name]  Planned Procedure: ROBOTIC DISTIAL PANCREATECTOMY  SPLENECTOMY ROBOTIC  Review of Systems         patient summary reviewed  nursing notes reviewed        Pulmonary  negative pulmonary ROS,    Cardiovascular    Hypertension, ECG reviewed and hyperlipidemia , Exercise Tolerance: > or = 4 METS        GI/Hepatic/Renal    GERD and well controlled        Endo/Other    HIV, low viral load and CD4 > 500 and hypothyroidism,      Neuro/Psych/MS        Cancer  CA,                     Physical Assessment      Airway       Mallampati: II    TM distance: >3 FB    Neck ROM: full  Mouth Opening: good.            Dental       Dentition intact             Pulmonary    Breath sounds clear to auscultation       Cardiovascular    Rhythm: regular  Rate: Normal       Other findings            Plan  ASA 3     Planned anesthesia type: general     general anesthesia with endotracheal tube intubation    plan to administer opioids postoperatively        Additional Plans: Pre-op Block    PONV/POV Plan:  I plan to administer pharmcologic prophalaxis antiemetics  Intravenous induction     Anesthesia issues/risks discussed are: Dental Injuries, Blood Loss, Art Line Placement, Sore Throat, Stroke, Cardiac Events/MI, Nerve Injuries, PONV, Intraoperative Awareness/ Recall, Post-op Pain Management, Aspiration, Local Anesthetic Systemic Toxicity and Failure of Block.  Anesthetic plan and risks discussed with patient  Signed consent obtained        Use of blood products discussed with patient who consented to blood products.     Patient's NPO status is appropriate for Anesthesia.           Plan discussed with attending.

## 2021-03-22 NOTE — Anesthesia Transfer of Care (Signed)
ANESTHESIA TRANSFER OF CARE   [name] is a 76 y.o. ,male, Weight: 76.5 kg (168 lb 10.4 oz)   had Procedure(s):  ROBOTIC DISTIAL PANCREATECTOMY  SPLENECTOMY ROBOTIC  performed  03/22/21   Primary Service: Nicola Police*    Past Medical History:   Diagnosis Date   . Cancer (CMS [facility])     prostate   . Deep vein thrombosis (DVT) (CMS HCC)     right leg   . Esophageal reflux    . H/O hearing loss    . High cholesterol    . 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 HTN (hypertension)    . 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 Hyperlipidemia     "borderline"   . Hypothyroidism    . MRSA (methicillin resistant staph aureus) culture positive 01/02/2021    MRSA left groin abscess 01/03/21   . MRSA (methicillin resistant staph aureus) culture positive 01/02/2021    MRSA blood 01/02/21   . Thyroid disorder    . Wears glasses       Allergy History as of 03/22/21      No Known Allergies              I completed my transfer of care / handoff to the receiving personnel during which we discussed:  Access, Airway, All key/critical aspects of case discussed, Analgesia, Antibiotics, Expectation of post procedure, Fluids/Product, Gave opportunity for questions and acknowledgement of understanding and PMHx    Post Location: PACU                                          Additional Info:Pt transferred to PACU. VSS and AQA.                        Last OR Temp: Temperature: 36 C (96.8 F)  ABG:  PH (ARTERIAL)   Date Value Ref Range Status   03/22/2021 7.36 7.35 - 7.45 Final     PH (T)   Date Value Ref Range Status   03/22/2021 7.36 7.35 - 7.45 Final     PCO2 (ARTERIAL)   Date Value Ref Range Status   03/22/2021 38 35 - 45 mm/Hg Final     PO2 (ARTERIAL)   Date Value Ref Range Status   03/22/2021 117 (H) 72 - 100 mm/Hg Final     SODIUM   Date Value Ref Range Status   03/22/2021 135 (L) 137 - 145 mmol/L Final     POTASSIUM   Date Value Ref Range Status   03/15/2021 4.6 3.5 -  5.1 mmol/L Final     KETONES   Date Value Ref Range Status   01/02/2021 Not Detected Not Detected mg/dL Final     WHOLE BLOOD POTASSIUM   Date Value Ref Range Status   03/22/2021 3.6 3.5 - 4.6 mmol/L Final     CHLORIDE   Date Value Ref Range Status   03/22/2021 106 101 - 111 mmol/L Final     CALCIUM   Date Value Ref Range Status   03/15/2021 9.0 8.8 - 10.2 mg/dL Final     Calculated P Axis   Date Value Ref Range Status   03/24/2020 33 degrees Final     Calculated R Axis  Date Value Ref Range Status   03/24/2020 34 degrees Final     Calculated T Axis   Date Value Ref Range Status   03/24/2020 50 degrees Final     IONIZED CALCIUM   Date Value Ref Range Status   03/22/2021 1.15 1.10 - 1.35 mmol/L Final     LACTATE   Date Value Ref Range Status   03/22/2021 1.0 0.0 - 1.3 mmol/L Final     HEMOGLOBIN   Date Value Ref Range Status   03/22/2021 11.1 (L) 12.0 - 18.0 g/dL Final     OXYHEMOGLOBIN   Date Value Ref Range Status   03/22/2021 97.8 85.0 - 98.0 % Final     CARBOXYHEMOGLOBIN   Date Value Ref Range Status   03/22/2021 1.8 0.0 - 2.5 % Final     MET-HEMOGLOBIN   Date Value Ref Range Status   03/22/2021 0.4 0.0 - 2.0 % Final     BASE DEFICIT   Date Value Ref Range Status   03/22/2021 3.6 (H) 0.0 - 3.0 mmol/L Final     BICARBONATE (ARTERIAL)   Date Value Ref Range Status   03/22/2021 22.1 18.0 - 26.0 mmol/L Final     TEMPERATURE, COMP   Date Value Ref Range Status   03/22/2021 36.9 15.0 - 40.0 C Final     Airway:* No LDAs found *  Blood pressure 116/67, pulse 60, temperature 36 C (96.8 F), resp. rate 13, height 1.753 m (_0 ), weight 76.5 kg (168 lb 10.4 oz), SpO2 97 %.

## 2021-03-22 NOTE — Anesthesia Procedure Notes (Signed)
Neuraxial Block    Performed by:   Performing Provider: Liam Rogers, MD   Authorizing provider: Thomas Hoff, MD  I was present and supervised/observed the entire procedure.  Thomas Hoff, MD 03/23/2021, 11:37  Sedation  The patient was continuously monitored throughout the procedure and in recovery. I was in attendance and supervised the sedation (during the start and stop times listed below) and remained immediately available until the patient returned to pre-procedure baseline.  Thomas Hoff, MD 03/23/2021, 11:37  Sedation Start Time  03/22/2021 11:58 AM   Sedation Stop Time 03/22/2021 12:19 PM  Blocks   Block: spinal   Type of block: single shot   Indication:at surgeon's request and Acute postoperative pain   Diagnosis: abdominal pain    Requesting surgeon: Nicola Police, MD  Technique:  Pt location: At bedside  Approach: midline        Preprocedure hand washing was performed sterile field maintained   Needle level: L3-4  Skin Prep: Aseptic technique, Sterile field established, Sterilely prepped and draped, Sterile gloves, Cap, Mask, Sterile technique, Sterile drape, Hand hygiene performed and Draped   Preanesthesia Checklist:  Pre anesthesia checklist: site marked, surgical consent, monitors and equipment checked, timeout performed, anesthesia consent, emergency drugs available and Pateint positioned  Position:  Positioning: sitting   Skin Local:  Skin local: Lidocaine 1%   Spinal Needle:  Spinal needle:Pencan    Spinal Needle gauge: 24 G    Spinal needle length: 4 inch Spinal needle attempts: 1.  Epidural Needle:                Epidural Catheter:              Block Events:     Test dose:                Medications    Dosing          NOTES  Block free text:0.15 mg intrathecal morphine placed for postoperative pain control, post-operative orders have been placed    Liam Rogers, MD 03/22/2021  [facility]  Pager [phone]  [phone]

## 2021-03-22 NOTE — Nurses Notes (Addendum)
1949: Pt arrived to 971 from [place].  Pt is alert and oriented x4. Pt denies SOB or uncontrolled pain at this time.  Telemetry is NSR.  Vital signs per flowsheet.  Respirations are even and unlabored.  Pt currently wearing 2LNC, plan to wean oxygen overnight.  Abdomen is soft, tender.  Surgical incision intact, small bloody drainage noted.  LUQ JP drain intact and patent.  Pt voiding via foley catheter.  Bilateral Lidocaine blocks infusing at ordered rate, catheter intact-dressings CD&I.  Pt very HOH, has bilateral hearing aides. Pt high fall risk.  Wife at bedside, very supportive to patient. Pt and wife oriented to hospital room, bed and Call bell functions.  Pt denies any needs at this time. Pt instructed to call for assistance, pt verbalized understanding. Call bell is within reach, bed is locked in the lowest position. Complete assessment per doc flowsheet. Will continue to monitor pt status.     2102: Pt c/o 7/10 mid abdominal pain.  Oxycodone 10 mg PO given per PRN order. Additionally--FS 202, 4 units Lispro insulin given SQ per PRN order.     2215 Pt has Lantus scheduled, pt question reason for Lantus stating he "doesn't take that at home".  RN paged Dr,. Donalynn Furlong. Dr. Donalynn Furlong ordered to recheck FS after earlier insulin coverage and if FS is <180 to hold Lantus. MD will discuss in the am.  FS recheck=177.  Lantus insulin held.     2303: Pt c/o breakthrough pain, 7/10 intermittent mid abdominal pain.  RN paged Dr. Donalynn Furlong as no IV prn pain medication is ordered. Awaiting call back. Oxygen weaned to [facility]- SpO2=92%.     2335: Dr. Donalynn Furlong returned page stating he was in a P1 trauma and will address breakthrough pain asap.       2343: Pt resting in bed with call bell within reach. Full assessment completed and unchanged from prior assessment.  Vital signs remain stable, per flowsheet.  Pt updated on MD plan for breakthrough pain and understanding.     0017: Dilaudid 0.2mg  IV Push given per PRN order for c/o 7/10 mid  abdominal pain.  Call bell within reach, pt denies any other needs.     8119: Pt c/o 6/10 mid abdominal soreness. Oxycodone 5mg  PO given per PRN order.

## 2021-03-22 NOTE — Consults (Signed)
[facility]  Acute Perioperative Pain Service Consult         Date of Service:  03/22/2021  [name], [name], 76 y.o. male  Encounter Start Date:  03/22/2021  Inpatient Admission Date:  03/22/2021  Date of Birth:  November 07, 1945    Plan:   [name] is a 76 y.o. male who is scheduled for  Procedure(s) (LRB):  ROBOTIC DISTIAL PANCREATECTOMY (N/A)  SPLENECTOMY ROBOTIC (N/A) with anticipated post-operative pain.  - Offered patient B/L Paravertebral via ESP approach peripheral nerve block catheters and Duramorph spinal. Patient agreeable to procedure.   - Scheduled and PRN medications per primary service.   - For any questions please page or call the Acute Perioperative Pain Service at [phone].   - If not already done, please place consult order to IP Acute Perioperative Pain --RAP Thank you for your consultation.     Suggested Coding/Billing: Level 3    Type of Pain Consultation: Perioperative Pain  Consult Requested By: Dr. Cyndi Bender  Reason for Consult: Acute Postoperative Pain (ICD-10-G89.18)  Laterality bilateral, Type of Surgery, Distal pancreatectomy and splenectomy     Chief Complaint/Pain location: Abdomen    History of Present Illness: [name] is a 76 y.o., White male  with chief complaint as above. Anesthesiology Perioperative Pain service consulted for evaluation of acute peri-op abdominal pain. The pain is anticipated to be described as sharp post surgical pain. Pain will be located at the surgical site. Onset is anticipated to start post-operatively. Aggravating factors: movement.  Alleviating factors: pain medications.       Past Medical History:  Past Medical History:   Diagnosis Date    Cancer (CMS HCC)     prostate    Deep vein thrombosis (DVT) (CMS HCC)     right leg    Esophageal reflux     H/O hearing loss     High cholesterol     History of kidney disease     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    HTN (hypertension)     Human immunodeficiency virus (HIV) disease (CMS  HCC)     Hyperlipidemia     "borderline"    Hypothyroidism     MRSA (methicillin resistant staph aureus) culture positive 01/02/2021    MRSA left groin abscess 01/03/21    MRSA (methicillin resistant staph aureus) culture positive 01/02/2021    MRSA blood 01/02/21    Thyroid disorder     Wears glasses          OSA diagnosed? no  History of Anticoagulant Use? No    Social History:   Social History     Tobacco Use    Smoking status: Never    Smokeless tobacco: Never   Vaping Use    Vaping Use: Never used   Substance Use Topics    Alcohol use: Not Currently    Drug use: Never       Past Family History:   Family Medical History:       Problem Relation (Age of Onset)    Cancer Mother, Father, Other    Heart Attack Mother    High Cholesterol Other    Stroke Other            ROS: Constitutional: negative for recent illnesses   Ears, nose, mouth, throat, and face: negative for nasal congestion, epistaxis, or tinnitus   Respiratory: negative for shortness of breath  Cardiovascular: negative for chest pain and dyspnea  Gastrointestinal:  negative for nausea, vomiting  Integument/breast: negative for rash and skin lesion  Hematologic/lymphatic: negative for easy bruising and bleeding  Musculoskeletal: negative for neck pain  Neurological: negative for numbness and tingling  Allergy: No Known Allergies      Exam:   Temperature: 36.9 C (98.4 F)  Heart Rate: 85  BP (Non-Invasive): (!) 148/95  Respiratory Rate: (!) 24  SpO2: 100 %  General: appears in good health  HENT: Head atraumatic. normocephalic, ENT without erythema or injection, mucous membranes moist.  Neck: supple, symmetrical, trachea midline.  Lungs: Non-labored breathing.  Cardiovascular: regular rate and rhythm. Appears well perfused.   Extremities: extremities normal, atraumatic, no cyanosis or edema  Skin: No rashes or lesions  Neurologic: Alert and oriented x3. No gross motor or sensory deficits in bilateral extremities.  Psychiatric: Normal affect, behavior,  speech      Labs:  I have reviewed all lab results.        I discussed treatment plan with patient including risks and benefits of procedures and patient consents.       [name] Rogers, MD   03/22/2021 11:40  Department of Anesthesiology  [facility] Pager [phone]    I saw and examined the patient.  I reviewed the resident's or APP's note.  I agree with the findings and plan of care as documented in the resident's note.  Any exceptions/additions are edited/noted.    [name] Hoff, MD  03/23/2021, 09:47

## 2021-03-22 NOTE — H&P (Signed)
[facility]  H&P Update Form    [name], 76 y.o. male  Encounter Start Date:  03/22/2021  Inpatient Admission Date: 03/22/2021  Date of Birth:  1946/01/04    03/22/2021    STOP: IF H&P IS GREATER THAN 30 DAYS FROM SURGICAL DAY COMPLETE NEW H&P IS REQUIRED.     H & P updated the day of the procedure.  1.  H&P completed within 30 days of surgical procedure and has been reviewed within 24 hours of admission but prior to surgery or a procedure requiring anesthesia services by Dr. Cyndi Bender on 03/22/2021 , the patient has been examined and is updated as follows: hyperglycemia noted and reviewed with patient and anesthesia.      Change in medications: No                Comments:     2. COVID: N/A    3.  Patient continues to be appropiate candidate for planned surgical procedure. YES      [name] Dawley, MD, [name]

## 2021-03-22 NOTE — Anesthesia Procedure Notes (Signed)
Block: Truncal block    Performed By:  Authorizing Provider:  Thomas Hoff, MD  Performing Provider:  Liam Rogers, MD  I was present and supervised/observed the entire procedure.  Thomas Hoff, MD 03/23/2021, 11:37  Sedation  The patient was continuously monitored throughout the procedure and in recovery. I was in attendance and supervised the sedation (during the start and stop times listed below) and remained immediately available until the patient returned to pre-procedure baseline.  Thomas Hoff, MD 03/23/2021, 11:37  Sedation Start Time  03/22/2021 11:58 AM   Sedation Stop Time 03/22/2021 12:19 PM  Type of Block: catheter Erector Spinae Paravertebral   Diagnosis: abdominal pain   Indication: Requested by surgeon and Acute post operative pain  Bilateral  Pt location: at bedside  Indication: post-op pain management  Requesting Surgeon: Nicola Police, MD  Site verified, H&P updated and consent obtained, Patient monitors applied, Timeout performed, Emergency drugs and equipment available, Patient positioned and anesthesia consent given  Technique(See MAR for doses)    Approach: LAX / IP    Preprocedure hand washing was performed sterile field maintained    Ultrasound used for needle placement, imaging, supervision, and interpretation  Image:  images available in PACS    Sterile Skin Prep : sterile gloves, cap, mask, draped, sterile drape, sterilely prepped and draped, hand hygiene performed, sterile field established, kit assembled on sterile field, aseptic technique and sterile technique    Skin prepped with: Chlorhexidine gluconate and isopropyl alcohol    Skin Local  Local amount 3 mL.of lidocaine 1%   Medications    Needle  Needle type: Hustead     Needle Gauge: 18G   Needle length: 3.5 in.  Number of attempts: 1      Catheter        NOTES: Continuous ultrasound was used to guide the needle tip in close proximity of the neural target.  There was minimal disruption of musculoskeletal and  vascular structures with exceptions as noted. An in-plane long-axis image was saved to the patient's medical record.  Appropriate hydrodissection was achieved and no intraneural injection was appreciated/occurred.    CPT Code 71696        Liam Rogers, MD 03/22/2021  [facility]  Pager [phone]  [phone]          Site  Initial injection:T9 and Additional injection:T9  Depth of needle insertion: 6 cm  Depth of perineural catheter: 12 cm    Assessment       Events  No Complications and Inserted easily   Patient tolerance of procedure: tolerated well, no immediate complications

## 2021-03-23 DIAGNOSIS — R109 Unspecified abdominal pain: Secondary | ICD-10-CM

## 2021-03-23 DIAGNOSIS — Z9889 Other specified postprocedural states: Secondary | ICD-10-CM

## 2021-03-23 DIAGNOSIS — G8918 Other acute postprocedural pain: Secondary | ICD-10-CM

## 2021-03-23 LAB — CBC WITH DIFF
BASOPHIL #: <0.1 10*3/uL (ref <=0.20)
BASOPHIL %: 0 %
EOSINOPHIL #: <0.1 10*3/uL (ref <=0.50)
EOSINOPHIL %: 0 %
HCT: 32.3 % — ABNORMAL LOW (ref 38.9–52.0)
HGB: 11 g/dL — ABNORMAL LOW (ref 13.4–17.5)
IMMATURE GRANULOCYTE #: 0.14 10*3/uL — ABNORMAL HIGH (ref <0.10)
IMMATURE GRANULOCYTE %: 1 % (ref 0–1)
LYMPHOCYTE #: 1.18 10*3/uL (ref 1.00–4.80)
LYMPHOCYTE %: 9 %
MCH: 30.9 pg (ref 26.0–32.0)
MCHC: 34.1 g/dL (ref 31.0–35.5)
MCV: 90.7 fL (ref 78.0–100.0)
MONOCYTE #: 0.91 10*3/uL (ref 0.20–1.10)
MONOCYTE %: 7 %
NEUTROPHIL #: 10.42 10*3/uL — ABNORMAL HIGH (ref 1.50–7.70)
NEUTROPHIL %: 83 %
PLATELETS: 115 10*3/uL — ABNORMAL LOW (ref 150–400)
RBC: 3.56 10*6/uL — ABNORMAL LOW (ref 4.50–6.10)
RDW-CV: 16.1 % — ABNORMAL HIGH (ref 11.5–15.5)
WBC: 12.7 10*3/uL — ABNORMAL HIGH (ref 3.7–11.0)

## 2021-03-23 LAB — BASIC METABOLIC PANEL
ANION GAP: 10 mmol/L (ref 4–13)
BUN/CREA RATIO: 20 (ref 6–22)
BUN: 22 mg/dL (ref 8–25)
CALCIUM: 8.1 mg/dL — ABNORMAL LOW (ref 8.8–10.2)
CHLORIDE: 105 mmol/L (ref 96–111)
CO2 TOTAL: 22 mmol/L — ABNORMAL LOW (ref 23–31)
CREATININE: 1.1 mg/dL (ref 0.75–1.35)
ESTIMATED GFR: 70 mL/min/BSA (ref >=60)
GLUCOSE: 147 mg/dL — ABNORMAL HIGH (ref 65–125)
POTASSIUM: 4.5 mmol/L (ref 3.5–5.1)
SODIUM: 137 mmol/L (ref 136–145)

## 2021-03-23 LAB — PHOSPHORUS: PHOSPHORUS: 3.5 mg/dL (ref 2.3–4.0)

## 2021-03-23 LAB — POC BLOOD GLUCOSE (RESULTS)
GLUCOSE, POC: 137 mg/dl — ABNORMAL HIGH (ref 70–105)
GLUCOSE, POC: 249 mg/dl — ABNORMAL HIGH (ref 70–105)
GLUCOSE, POC: 192 mg/dl — ABNORMAL HIGH (ref 70–105)
GLUCOSE, POC: 190 mg/dl — ABNORMAL HIGH (ref 70–105)

## 2021-03-23 LAB — AMYLASE BODY FLUID: AMYLASE BODY FLUID: >6554 U/L

## 2021-03-23 LAB — MAGNESIUM: MAGNESIUM: 2 mg/dL (ref 1.8–2.6)

## 2021-03-23 MED ORDER — SIMETHICONE 80 MG CHEWABLE TABLET
80.0000 mg | CHEWABLE_TABLET | Freq: Four times a day (QID) | ORAL | Status: DC | PRN
Start: 2021-03-23 — End: 2021-03-25
  Administered 2021-03-23: 80 mg via ORAL
  Filled 2021-03-23: qty 1

## 2021-03-23 MED ORDER — ELECTROLYTE-A INTRAVENOUS SOLUTION
INTRAVENOUS | Status: DC
Start: 2021-03-23 — End: 2021-03-24
  Administered 2021-03-24: 0 mL via INTRAVENOUS

## 2021-03-23 MED ORDER — HYDROMORPHONE 1 MG/ML INJECTION WRAPPER
0.2000 mg | INJECTION | INTRAMUSCULAR | Status: DC | PRN
Start: 2021-03-23 — End: 2021-03-25
  Administered 2021-03-23 – 2021-03-24 (×7): 0.2 mg via INTRAVENOUS
  Filled 2021-03-23 (×7): qty 1

## 2021-03-23 MED ORDER — METHOCARBAMOL 500 MG TABLET
500.0000 mg | ORAL_TABLET | Freq: Four times a day (QID) | ORAL | Status: DC
Start: 2021-03-23 — End: 2021-03-25
  Administered 2021-03-23 – 2021-03-25 (×9): 500 mg via ORAL
  Administered 2021-03-25: 0 mg via ORAL
  Filled 2021-03-23 (×9): qty 1

## 2021-03-23 MED ORDER — ELVITEG 150 MG-COB 150 MG-EMTRICIT 200 MG-TENOFO ALAFENAM 10 MG TABLET
1.0000 | ORAL_TABLET | Freq: Every evening | ORAL | Status: DC
Start: 2021-03-23 — End: 2021-03-25
  Administered 2021-03-23 – 2021-03-24 (×2): 1 via ORAL
  Filled 2021-03-23 (×3): qty 1

## 2021-03-23 NOTE — OR Surgeon (Signed)
PATIENT NAME: [name] NUMBER:  [id number]  DATE OF SERVICE: 03/22/2021  DATE OF BIRTH:  1945/05/10    OPERATIVE REPORT    PREOPERATIVE DIAGNOSIS:  Pancreatic tail mucinous cyst.    POSTOPERATIVE DIAGNOSIS:  Pancreatic tail mucinous cyst.    NAME OF PROCEDURES:  1. Diagnostic laparoscopy with conversion to laparotomy.  2. Open distal pancreatectomy and splenectomy.  3. Explantation of prior hernia mesh.    SURGEON:  Lynwood Dawley, MD, FACS.    ASSISTANT:  Sheralyn Boatman, PA-C.    ANESTHESIA:  General endotracheal anesthesia.    ESTIMATED BLOOD LOSS:  200 mL.    INDICATIONS FOR PROCEDURE:  [name] is a 76 year old gentleman with a history of an enlarging pancreatic tail cyst.  He underwent EUS and biopsy, which revealed this to be a mucinous cyst.  Given the size and growth, we discussed a robotic, possible open, distal pancreatectomy and splenectomy.  Given his history of multiple prior procedures, we discussed the high likelihood of an open procedure.   After discussing the indications, risks, benefits, and alternatives, he agreed to the procedure and signed informed consent.    DESCRIPTION OF PROCEDURE:  The patient was identified in the preoperative holding area, transported by anesthesia back to the operating room, where he was placed supine on the operating room table.  General anesthesia was induced and an endotracheal tube was passed without difficulty.  A Foley catheter and arterial line were placed.  He was positioned on a split leg table with his arms out.  His abdomen was prepped and draped in the standard sterile fashion.  He was given preoperative antibiotics and a time-out was performed prior to making the incision.  A left upper quadrant incision was made.  An optical separator device was used to gain access to the abdominal cavity.  The abdomen was then insufflated and explored.  Immediately upon entry to the abdomen, there were dense adhesions throughout the abdomen with  multiple loops of bowel that were densely adherent to the abdominal wall.  Therefore, after placing this port, we insufflated the abdomen and then made the decision to convert to an open laparotomy.  An upper midline incision was made.  We went down to the fascia with electrocautery and then sharply entered the abdomen.  There was a piece of mesh which had fallen away from the abdominal wall and was within the peritoneal cavity, which had multiple adhesions of omentum to it.  Once we had opened the abdomen and assessed this piece of mesh, it was explanted and sent for permanent gross evaluation. We continued to lyse adhesions until we had freed up the anterior abdominal wall and then moved inferiorly.  Once we had an adequate incision, the [place] Silos was placed and we retracted.  We then opened the lesser sac and mobilized the stomach cephalad.  The short gastrics were taken down with the LigaSure.  We identified the pancreas and a large cyst in the pancreatic body and tail.  We began to identify the inferior border of the pancreas.  We then palpated the splenic artery and dissected it out and placed a Vesseloop.  We then created a retropancreatic tunnel over top of the SMV and used a 60 purple to divide the pancreas at the pancreatic neck.  The splenic vein was then dissected out and encircled with a Vesseloop.  At this point, we took the splenic artery with the gold 30 load of the Endo GIA.  Once  the stapler was closed and we ensured adequate pulse in the hepatic artery, which was confirmed, the stapler was then fired.  We then took the splenic vein with a gold 45.  The LigaSure was then used to take the retroperitoneal attachments behind the cyst with mobilization of the splenic flexure of the colon and then continued retroperitoneal dissection until the spleen was removed.  We then copiously irrigated the abdomen and ensured adequate hemostasis.  A 19 round Blake drain was brought through our laparoscopy site down  into the splenectomy bed and terminating at the pancreatic transection line.  The stomach was then placed over top of the drain.  The fascia, although prior mesh had been used, felt strong and was tension free.  Therefore, we closed with #1 looped PDS with interrupted #1 Prolene figure-of-8 sutures throughout the abdomen.  The skin and subcutaneous tissue was then irrigated and closed with staples.  I was present and scrubbed for all portions of the case.  All sponge, needle, and instrument counts were correct at the end of the case.  The assistance of Allene Pyo, PA-C, was required due to the fact no qualified resident was available to assist with this complex minimally invasive procedure.  Adam helped as the bedside assistant for the case as no resident was available. He also helped as the bedside assistant during the laparoscopic portion.  The patient tolerated the procedure well and was transported to recovery in stable condition.        Lynwood Dawley, MD, FACS  Assistant Professor              DD:  03/22/2021 17:52:06  DT:  03/23/2021 07:42:00 DG  D#:  [id number]

## 2021-03-23 NOTE — Pharmacy (Signed)
Pharmacy Medication Reconciliation    Patient Name: [name], [name]  Date of Service: 03/23/2021  Date of Admission: 03/22/2021  Date of Birth: 08-Jan-1946  Length of Stay:   1 day   Service: SURG ONCOLOGY      Transitions of Care:  Discharge Pharmacy services were discussed with the patient and the Meds to [facility] flowsheet and preferred pharmacy information were updated in EMR if applicable and able to assess with patient.    Information was collected from:  Patient and Pharmacy    After attempting unsuccessfully to reach Mr. Want on his bedside phone, I tried his mobile [phone]. After explaining to the patient the purpose for my call, he disconnected me. The following prescriptions are reported by [facility]. [facility], most recently cephalexin on 02/02.    [id number] [address] [place]  [address]  [place] [address]  Phone: [phone] Fax: [phone]    [place], [place] - [address]  [address]  [place] [address]  Phone: [phone] Fax: [phone]    Clarified Prior to Admission Medications:  Prior to Admission medications    Medication Sig Taking Resumed Y/N (RPh) Comments   amLODIPine (NORVASC) 5 mg Oral Tablet Take 1 Tablet (5 mg total) by mouth Once a day   yes Last fill 12/07 for 90 days        atorvastatin (LIPITOR) 80 mg Oral Tablet Take 40 mg by mouth Once a day Take one half pill every morning with breakfast (40 mg)   yes Last fill 11/18 for 90 days    [facility] one-half of an 80 mg tablet daily      BIOTIN ORAL Take by mouth   no       cetirizine (ZYRTEC) 10 mg Oral Tablet Take 1 Tablet (10 mg total) by mouth Once a day   yes         elviteg-cob-emtri-tenof ALAFEN (GENVOYA) 150-150-200-10 mg Oral Tablet 1 Tablet Once a day With the evening meal   yes Last fill 01/05 for 90 days            gabapentin (NEURONTIN) 300 mg Oral Capsule Take 1 Capsule (300 mg  total) by mouth Twice daily   yes [facility] reports [facility] has been on [facility]. RPh notes show patient told them 05/14/2020 that he has an "overstock" and doesn't need it filled. Nothing present on [facility].      levothyroxine (SYNTHROID) 88 mcg Oral Tablet Take 75 mcg by mouth Every morning   yes Last fill 12/15 for 90 days          melatonin 10 mg Oral Capsule Take by mouth Every night   no         MULTIVITAMIN ORAL Take by mouth   no       omeprazole (PRILOSEC) 20 mg Oral Capsule, Delayed Release(E.C.) Take 1 Capsule (20 mg total) by mouth Twice daily   -- [facility] per [facility]        pantoprazole (PROTONIX) 40 mg Oral Tablet, Delayed Release (E.C.) Take 1 Tablet (40 mg total) by mouth Twice daily 30 minutes before a meal   yes (daily) Last fill 11/07 for 90 days          POTASSIUM ORAL Take by mouth   no       tamsulosin (FLOMAX) 0.4 mg Oral Capsule Take 1  Capsule (0.4 mg total) by mouth Twice daily  Patient not taking: Reported on 02/08/2021   no Last fill 11/18 for 90 days              [facility] however I did not add them without being able to confirm with the patient:  1. Cyanocobalamin 1,000 mcg  -  11/18 for 90 days  2. Duloxetine 30 mg  -  12/07 for 90 days  3. Fluticasone 50 mcg  -  11/18 for 90 days    Did patient's home medication list require updates? No    Allergies:    No Known Allergies    Melony Overly, Pharmacy Technician    Did pharmacist make suggestions for medication reconciliation? Yes    Medications REMOVED from home medication list:  - tamsulosin  - omeprazole    Pharmacist Recommendations:  - may consider resuming duloxetine (as long as patient confirms that he takes it)    Harrel Carina, [facility]  03/23/2021, 13:50

## 2021-03-23 NOTE — Consults (Signed)
[facility]  ACUTE PAIN SERVICE PROGRESS NOTE    DATE OF SERVICE:  03/23/2021  DIAGNOSIS:  Acute post-operative pain, s/p DISTIAL PANCREATECTOMY  SPLENECTOMY      BLOCK PERFORMED:    Bilateral  ESP catheters and duramorph spinal single shot   placed 03/22/21. Today is post-procedure day 1.    MEDICATION:  Lidocaine 0.4% at 2 ml/hr with 8 mL PIB    PLAN:   - Continue current infusion without change  - Please call [phone] w/questions or concerns regarding [name]'s regional anesthesia management    Catheter Site Assessment:  Bilateral Clean, dry, intact; no surrounding tenderness, erythema, or induration    PE:   Neuro: alert and cooperative    SUBJECTIVE:  Patient reports good control of their pain (5/10). When the patient has pain it is located over his abdomen. [name] has no complaints related to their block. The patient denies headache, perioral numbness, metallic taste, and tinnitus.        VITALS:   Temperature: 36.7 C (98.1 F)  Heart Rate: 95  BP (Non-Invasive): (!) 144/88  Respiratory Rate: 15  SpO2: 92 %    Analgesics (last 24 hours)       Date/Time Action Medication Dose    03/23/21 0938 Given    HYDROmorphone (DILAUDID) 1 mg/mL injection 0.2 mg    03/23/21 0802 Given    oxyCODONE (ROXICODONE) immediate release tablet 5 mg    03/23/21 0801 Given    acetaminophen (TYLENOL) tablet 975 mg    03/23/21 0510 Given    HYDROmorphone (DILAUDID) 1 mg/mL injection 0.2 mg    03/23/21 0314 Given    oxyCODONE (ROXICODONE) immediate release tablet 5 mg    03/23/21 0017 Given    HYDROmorphone (DILAUDID) 1 mg/mL injection 0.2 mg    03/22/21 2102 Given    oxyCODONE (ROXICODONE) immediate release tablet 10 mg    03/22/21 2101 Given    acetaminophen (TYLENOL) tablet 975 mg    03/22/21 1816 Given    HYDROmorphone (DILAUDID) 1 mg/mL injection 0.4 mg    03/22/21 1806 Given    HYDROmorphone (DILAUDID) 1 mg/mL injection 0.4 mg    03/22/21 1756 Given    HYDROmorphone (DILAUDID) 1 mg/mL injection 0.4 mg     03/22/21 1746 Given    HYDROmorphone (DILAUDID) 1 mg/mL injection 0.4 mg    03/22/21 1139 Given    acetaminophen (TYLENOL) tablet 975 mg            Anticoagulants/Antiplatelets (last 24 hours)       Date/Time Action Medication Dose    03/23/21 0511 Given    heparin 5,000 unit/mL injection 5,000 Units            Liver/Pancreas Enzyme Results Liver Function Results   No results found for this or any previous visit (from the past 30 hour(s)). No results found for this or any previous visit (from the past 30 hour(s)).          BMP Results Other Chemistries Results   Results for orders placed or performed during the hospital encounter of 03/22/21 (from the past 30 hour(s))   BASIC METABOLIC PANEL    Collection Time: 03/23/21  3:03 AM   Result Value    SODIUM 137    POTASSIUM 4.5    CHLORIDE 105    CO2 TOTAL 22 (L)    GLUCOSE 147 (H)    BUN 22    CREATININE 1.10    Recent Results (  from the past 30 hour(s))   PHOSPHORUS    Collection Time: 03/23/21  3:03 AM   Result Value    PHOSPHORUS 3.5   MAGNESIUM    Collection Time: 03/23/21  3:03 AM   Result Value    MAGNESIUM 2.0               Rada Hay, MD 03/23/2021, 10:12    [place] [facility] An[facility]    I saw and examined the patient.  I reviewed the resident's or APP's note.  I agree with the findings and plan of care as documented in the resident's note.  Any exceptions/additions are edited/noted.    Thomas Hoff, MD  03/23/2021, 11:01

## 2021-03-23 NOTE — Care Plan (Signed)
[place]  Occupational Therapy Initial Evaluation    Patient Name: [name]  Date of Birth: December 31, 1945  Height: Height: 175.3 cm ('5\' 9"'$ )  Weight: Weight: 76.3 kg (168 lb 3.4 oz)  Room/Bed: 970/A  Payor: MEDICARE / Plan: MEDICARE PART A AND B / Product Type: Medicare /     Assessment:   Pt demonstrates decreased activity tolerance, generalized weakness, and pain in incisional area of the abdomen during evaluation. Pt is able to perform functional mobility and transfers with CGA and is expected to progress well with continuation of therapy to promote increased endurance, safety, balance, and body mechanics. Pt requires min-mod (A) for ADL tasks but will progress as pain subsides and increases trunk ROM. Recommend home with assist from wife.      Discharge Needs:   Equipment Recommendation: none anticipated  Discharge Disposition: home with assist    JUSTIFICATION OF DISCHARGE RECOMMENDATION   Based on current diagnosis, functional performance prior to admission, and current functional performance, this patient requires continued OT services in home with assist  in order to achieve significant functional improvements.    Plan:   Current Intervention: ADL retraining, IADL retraining, bed mobility training, endurance training, strengthening    To provide Occupational therapy services minimum of 1x/week, until discharge.       The risks/benefits of therapy have been discussed with the patient/caregiver and he/she is in agreement with the established plan of care.       Subjective & Objective      03/23/21 1017   Therapist Pager   OT Assigned/ Pager # Amine Adelson [phone]   Rehab Session   Document Type evaluation   Total OT Minutes: 16   Patient Effort good   Patient Effort, Rehab Treatment Comment Pt agreeable to OT/PT eval   Symptoms Noted During/After Treatment fatigue   General Information   Patient Profile Reviewed yes   Onset of Illness/Injury or Date of Surgery 03/22/21    General Observations of Patient Pt reclined in bed, wife present at bedside   Pertinent History of Current Functional Problem 76 y.o. 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   Medical Lines Peripheral Drain;PIV Line;Telemetry   Respiratory Status nasal cannula  (1L O2)   Existing Precautions/Restrictions full code;fall precautions   Pre Treatment Status   Pre Treatment Patient Status Patient supine in bed;Call light within reach;Telephone within reach;Sitter select activated;Nurse approved session   Support Present Pre Treatment  Family present   Communication Pre Treatment  Nurse   Mutuality/Individual Preferences   Individualized Care Needs OOB with CGA x1   Plan of Care Reviewed With patient;spouse   Living Environment   Lives With spouse   Living Arrangements house   Home Assessment: Stairs in [place] Accessibility bed and bath on same level;stairs to enter home;stairs within home   Home Main Entrance   Number of Stairs, Main Entrance five   Stairs Within Home, Primary   Number of Stairs, Within Home, Primary two   Functional Level Prior   Ambulation 0 - independent   Transferring 0 - independent   Toileting 0 - independent   Bathing 0 - independent   Dressing 0 - independent   Eating 0 - independent   Self-Care   Equipment Currently Used at Home other (see comments)  (avaliable FWW/cane/w/c/ramp)   Vital Signs   Vitals Comment VSS on RA during session  Pain Assessment   Additional Documentation Pain Scale: Numbers Pre/Post-Treatment (Group)   Pain Assessment   Pain Intervention  Rest   Pretreatment Pain Rating 5/10   Posttreatment Pain Rating 5/10   Pain Location - Orientation incisional   Pain Location abdomen   Pain Scale: Numbers Pre/Post-Treatment   Pain Location - Side/Orientation Bilateral   Pain Location - abdomen   Coping/Psychosocial   Observed Emotional State calm;cooperative   Verbalized Emotional State acceptance    Family/Support System   Family/Support Persons spouse;family   Involvement in Care at bedside   Coping/Psychosocial Response Interventions   Plan Of Care Reviewed With patient;spouse   Cognitive Assessment/Interventions   Behavior/Mood Observations behavior appropriate to situation, WNL/WFL   Orientation Status oriented x 4   Attention WNL/WFL   Follows Commands WNL;WFL   RUE Assessment   RUE Assessment WFL- Within Functional Limits   LUE Assessment   LUE Assessment WFL- Within Functional Limits   RLE Assessment   RLE Assessment WFL- Within Functional Limits   LLE Assessment   LLE Assessment WFL- Within Functional Limits   Trunk Assessment   Trunk Assessment X-Exceptions   Trunk ROM limited 2/2 pain   Musculoskeletal   LUE Weight-Bearing Status full weight-bearing   RUE Weight-Bearing Status full weight-bearing   LLE Weight-Bearing Status full weight-bearing   RLE Weight-Bearing Status full weight-bearing   Grip Strength   Grip Left (5/5) normal, left   Right Grip (5/5) normal, right   Mobility Assessment/Training   Mobility Comment Pt completed bed mobility with CGA and v/c for body mechanics, sequencing, and to demonstrate log rolling technique to optimize safety and decrease pain in abdomen. Pt ambulated 250' with hand-held assist initially for 20' until feeling more secure and balanced on his feet and progressed to CGA. CGA for sit-stand/stand-sit.   Bed Mobility Assessment/Treatment   Bed Mobility, Assistive Device Head of Bed Elevated   Supine-Sit Independence contact guard assist;verbal cues required   Safety Issues impaired trunk control for bed mobility   Impairments flexibility decreased;endurance;pain;strength decreased   Transfer Assessment/Treatment   Sit-Stand Independence contact guard assist   Stand-Sit Independence contact guard assist   Sit-Stand-Sit, Assist Device handheld assist   ADL Assessment/Intervention   ADL Comments Pt completed LB dressing at EOB with min (A) to thread pants over feet  limited by generalized weakness and decreased trunk flexion 2/2 pain. Pt has assist from wife with ADL tasks. May require min-mod (A) for bathing, toleting hygiene for safety and body mechanics to decrease pain in abdomen and set-up (A) for grooming and upper body dressing. Pt educated on use of reacher to perform dressing tasks to decrease flexion and painful mobility from surgical incision, pt reports having a reacher at home.   Lower Body Dressing Assessment/Training   Position sitting;standing   ASSISTANCE REQUIRED DONNING Pants right leg;Pants left leg   Independence Level  minimum assist (75% patient effort)   Impairments flexibility decreased;pain   Balance Skill Training   Sitting Balance: Static good balance   Sitting, Dynamic (Balance) fair + balance   Sit-to-Stand Balance fair balance   Standing Balance: Static fair balance   Standing Balance: Dynamic fair balance   Post Treatment Status   Post Treatment Patient Status Patient sitting in bedside chair or w/c;Call light within reach;Telephone within reach;Sitter select activated   Support Present Post Treatment  Family present   Financial trader Nurse   Care Plan Goals   OT Rehab Goals Occupational Therapy Goal;Occupational Therapy Goal 2  Occupational Therapy Goals   OT Goal, Date Established 03/23/21   OT Goal, Time to Achieve by discharge   OT Goal, Activity Type Pt will be able to complete toileting & hygiene routine with independence.   OT Goal, Independence Level independent   Occupational Therapy Goal 2   OT Goal, Date Established 03/23/21   OT Goal, Time to Achieve by discharge   OT Goal, Activity Type Pt will be able to complete dressing routine at EOB with good awareness to abdominal incision with a pain level of less than 3.   OT Goal, Independence Level independent   Planned Therapy Interventions, OT Eval   Planned Therapy Interventions ADL retraining;IADL retraining;bed mobility training;endurance training;strengthening    Functional Impairment   Overall Functional Impairments/Problem List balance impaired;endurance;flexibility decreased;strength decreased;pain   Clinical Impression   Functional Level at Time of Session Pt demonstrates decreased activity tolerance, generalized weakness, and pain in incisional area of the abdomen during evaluation. Pt is able to perform functional mobility and transfers with CGA and is expected to progress well with continuation of therapy to promote increased endurance, safety, balance, and body mechanics. Pt requires min-mod (A) for ADL tasks but will progress as pain subsides and increases trunk ROM. Recommend home with assist from wife.   Patient/Family Goals Statement Pt wants to get strong enough to go golfing again.   Criteria for Skilled Therapeutic Interventions Met (OT) yes;meets criteria   Rehab Potential good   Therapy Frequency minimum of 1x/week   Predicted Duration of Therapy until discharge   Anticipated Equipment Needs at Discharge none anticipated   Anticipated Discharge Disposition home with assist   Daily Activity AM-PAC/6-clicks Score   Putting on/Taking off clothing on lower body 2   Bathing 3   Toileting 3   Putting on/Taking off clothing on upper body 3   Personal grooming 4   Eating Meals 4   Raw Score Total 19   Standardized (t-scale) Score 40.22   CMS 0-100% Score 42.8   CMS Modifier CK   Evaluation Complexity Justification   Occupational Profile Review Brief history   Performance Deficits 1-3 deficits   Clinical Decision Making Low analytic complexity   Evaluation Complexity Low     OT Education: Patient/family educated on Role of OT at [facility], energy conservation strategies during ADL, falls prevention, home safety strategies, abdominal precautions, adaptive equipment, importance of participating in OOB activity and ADL routine during hospital stay.  Patient/family verbalized/demonstrated initial understanding, would benefit from reinforcement to ensure carry  over.  Therapist:   Carney Corners, OT   Pager #: [phone]

## 2021-03-23 NOTE — Care Plan (Signed)
[place]  Physical Therapy Initial Evaluation    Patient Name: [name]  Date of Birth: 1945/04/21  Height: Height: 175.3 cm (5\' 9" )  Weight: Weight: 76.3 kg (168 lb 3.4 oz)  Room/Bed: 971/A  Payor: MEDICARE / Plan: MEDICARE PART A AND B / Product Type: Medicare /     Assessment:      Pt tolerated PT eval well demonstrating functional mobility with CGA/SBA. Pt amb x 250' without aggravation of symptoms. Pt presents with impairmetns in pain, flexibility, abdominal strength, and mobility. PT recommends home with assist.    Discharge Needs:    Equipment Recommendation: none anticipated    Discharge Disposition: home with assist    JUSTIFICATION OF DISCHARGE RECOMMENDATION   Based on current diagnosis, functional performance prior to admission, and current functional performance, this patient requires continued PT services in home with assist in order to achieve significant functional improvements in these deficit areas: aerobic capacity/endurance, gait, locomotion, and balance, muscle performance.        Plan:   Current Intervention: balance training, bed mobility training, gait training, home exercise program, patient/family education, stair training, strengthening, transfer training  To provide physical therapy services minimum of 2x/week  for duration of until discharge.    The risks/benefits of therapy have been discussed with the patient/caregiver and he/she is in agreement with the established plan of care.       Subjective & Objective        03/23/21 1018   Therapist Pager   PT Assigned/ Pager # Apolonio Schneiders [phone]   Rehab Session   Document Type evaluation   Total PT Minutes: 16   Patient Effort good   General Information   Patient Profile Reviewed yes   Onset of Illness/Injury or Date of Surgery 03/22/21   Pertinent History of Current Functional Problem 76 y.o. male who is s/p robotic converted to open distal pancreatectomy and splenectomy on 03/23/21 for pancreatic tail  IPMN.   Medical Lines PIV Line;Telemetry;Peripheral Drain   Respiratory Status room air   Existing Precautions/Restrictions fall precautions   Mutuality/Individual Preferences   Individualized Care Needs OOB with A x 1   Living Environment   Lives With spouse   Living Arrangements house   Home Accessibility stairs to enter home   Home Main Entrance   Number of Stairs, Main Entrance five   Functional Level Prior   Ambulation 0 - independent   Transferring 0 - independent   Toileting 0 - independent   Bathing 0 - independent   Dressing 0 - independent   Prior Functional Level Comment has access to cane and FWW if needed   Pre Treatment Status   Pre Treatment Patient Status Patient supine in bed;Call light within reach;Telephone within reach;Sitter select activated;Nurse approved session   Support Present Pre Treatment  Family present   Communication Pre Treatment  Nurse   Cognitive Assessment/Interventions   Behavior/Mood Observations behavior appropriate to situation, WNL/WFL   Orientation Status oriented x 4   Attention WNL/WFL   Follows Commands WNL   Vital Signs   Vitals Comment VSS   Pain Assessment   Pretreatment Pain Rating 5/10   Posttreatment Pain Rating 5/10   Pain Location - Orientation incisional   Pain Location abdomen   RLE Assessment   RLE Assessment WFL- Within Functional Limits   LLE Assessment   LLE Assessment WFL- Within Functional Limits   Trunk Assessment   Trunk Assessment X-Exceptions   Trunk ROM  decreased 2/2 pain   Trunk Strength decreased   Bed Mobility Assessment/Treatment   Bed Mobility, Assistive Device Head of Bed Elevated   Supine-Sit Independence contact guard assist;verbal cues required   Safety Issues impaired trunk control for bed mobility   Impairments flexibility decreased;pain;strength decreased   Comment verbal cues for logroll to R side   Transfer Assessment/Treatment   Sit-Stand Independence contact guard assist   Stand-Sit Independence contact guard assist   Sit-Stand-Sit,  Assist Device handheld assist   Transfer Safety Issues balance decreased during turns;step length decreased   Transfer Impairments balance impaired;pain;flexibility decreased;strength decreased   Gait Assessment/Treatment   Total Distance Ambulated 250   Independence  contact guard assist   Distance in Feet 250   Deviations  cadence decreased;step length decreased   Safety Issues  balance decreased during turns;step length decreased   Impairments  balance impaired;flexibility decreased;pain;strength decreased   Balance Skill Training   Sitting Balance: Static good balance   Sitting, Dynamic (Balance) fair + balance   Sit-to-Stand Balance fair balance   Standing Balance: Static fair + balance   Standing Balance: Dynamic fair balance   Systems Impairment Contributing to Balance Disturbance musculoskeletal   Identified Impairments Contributing to Balance Disturbance pain;decreased strength   Post Treatment Status   Post Treatment Patient Status Patient sitting in bedside chair or w/c;Call light within reach;Telephone within reach;Sitter select activated   Support Present Post Treatment  Family present   Environmental manager   Plan of Care Review   Plan Of Care Reviewed With patient   Basic Mobility Am-PAC/6Clicks Score (APPROVED PT Staff, [facility], [place], [place], [place], and FMT)   Turning in bed without bedrails 4   Lying on back to sitting on edge of flat bed 3   Moving to and from a bed to a chair 3   Standing up from chair 3   Walk in room 3   Climbing 3-5 steps with railing 3   6 Clicks Raw Score total 19   Standardized (t-scale) score 42.48   CMS 0-100% Score 36.99   CMS Modifier CJ   Patient Mobility Goal (JHHLM) 8- Walk 250 feet or more 3X/day   Exercise/Activity Level Performed 8- Walked 250 feet or more   Physical Therapy Clinical Impression   Assessment Pt tolerated PT eval well demonstrating functional mobility with CGA/SBA. Pt amb x 250' without aggravation of symptoms. Pt presents with  impairmetns in pain, flexibility, abdominal strength, and mobility. PT recommends home with assist.   Patient/Family Goals Statement improve mobility   Criteria for Skilled Therapeutic skilled treatment is necessary   Pathology/Pathophysiology Noted musculoskeletal;neuromuscular   Impairments Found (describe specific impairments) aerobic capacity/endurance;gait, locomotion, and balance;muscle performance   Functional Limitations in Following  self-care;home management   Disability: Inability to Perform community/leisure   Rehab Potential good   Therapy Frequency minimum of 2x/week   Predicted Duration of Therapy Intervention (days/wks) until discharge   Anticipated Equipment Needs at Discharge (PT) none anticipated   Anticipated Discharge Disposition home with assist   Evaluation Complexity Justification   Patient History: Co-morbidity/factors that impact Plan of Care 3 or more that impact Plan of Care   Examination Components 3 or more Exam elements addressed   Presentation Stable: Uncomplicated, straight-forward, problem focused   Clinical Decision Making Moderate complexity   Evaluation Complexity Moderate complexity   Care Plan Goals   PT Rehab Goals Gait Training Field seismologist  Goal, Distance  to Achieve   Gait Training  Goal, Date Established 03/23/21   Gait Training  Goal, Time to Achieve by discharge   Gait Training  Goal, Independence Level independent   Gait Training  Goal, Distance to Achieve 300   Stairs Training Goal   Stairs Training Goal, Date Established 03/23/21   Stairs Training Goal, Time to Achieve by discharge   Stairs Training Goal, Independence Level modified independence   Stairs Training Goal, Assist Device least restrictive assistive device   Stairs Training Goal, Number of Stairs to Achieve 5   Transfer Training Goal   Transfer Training Goal, Date Established 03/23/21   Transfer Training Goal, Time to Achieve by discharge   Transfer  Training Goal, Activity Type all transfers   Transfer Training Goal, Independence Level independent   Planned Therapy Interventions, PT Eval   Planned Therapy Interventions (PT) balance training;bed mobility training;gait training;home exercise program;patient/family education;stair training;strengthening;transfer training       Therapist:   Annalee Genta, PT   Pager #: [phone]

## 2021-03-23 NOTE — OR Surgeon (Signed)
[facility]                                                     BRIEF OPERATIVE NOTE    Patient Name: [name], [name] Number: [id number]  Date of Service: 03/23/2021   Date of Birth: 05/06/1945    All elements must be documented.    Pre-Operative Diagnosis: Pancreatic tail IPMN   Post-Operative Diagnosis: Same  Procedure(s)/Description: Laparoscopic converted to open distal pancreatectomy and splenectomy  Findings/Complexity (inherent to the procedure performed): Large lobulated mass in the tail of the pancreas     Attending Surgeon: Cyndi Bender, MD  Assistant(s): Danialle Dement, PA-C    Anesthesia Type: General  Estimated Blood Loss:  150 cc  Blood Given: None  Fluids Given: 1.3 L crystalloid   Complications (not routinely expected or not inherent to difficulty/nature of procedure): None  Characteristic Event (routinely expected or inherent to the difficulty/nature of the procedure): None  Did the use of current and/or prior Anticoagulants impact the outcome of the case? N\A  Wound Class: Clean Wound: Uninfected operative wounds in which no inflammation occurred    Tubes: None  Drains: Shannan Harper  Specimens/ Cultures: Distal pancreas and spleen  Implants: None           Disposition: PACU - hemodynamically stable.  Condition: stable    Allene Pyo, PA-C

## 2021-03-23 NOTE — Care Management Notes (Signed)
[place] Management Initial Evaluation    Patient Name: [name]  Date of Birth: [birth year]  Sex: male  Date/Time of Admission: 03/22/2021 10:33 AM  Room/Bed: 970/A  Payor: MEDICARE / Plan: MEDICARE PART A AND B / Product Type: Medicare /   Primary Care Providers:  [place] (General)    Pharmacy Info:   Preferred Pharmacy       [id number]   [id number]   Phone: [phone]: [phone]   Hours: Not open 24 hours    [place] [address]    [address] [id number]   Phone: [phone]: [phone]   Hours: Not open 24 hours          Emergency Contact Info:   Extended Emergency Contact Information  Primary Emergency Contact: [name] Mobile Phone: [phone] Relation: Wife  Preferred language: English  Interpreter needed? No    History:   [name] LUNZis a 76y.o., male, admitted with IPMN    Height/Weight: 175.3 cm ('5\' 9"'$ ) / 76.3 kg (168 lb 3.4 oz)     LOS: 1 day   Admitting Diagnosis: IPMN (intraductal papillary mucinous neoplasm) [D49.0]    Assessment:      03/23/21 1406   Assessment Details   Assessment Type Admission   Date of Care Management Update 03/23/21   Date of Next DCP Update 03/26/21   Readmission   Is this a readmission? No   Insurance Information/Type   Insurance type Other (see comments)   Comment (Other Insurance[place]   Employment/Financial   Patient has Prescription Coverage?  Yes        Name of Insurance Coverage for Medications [facility]   Financial Concerns none   Living Environment   Select an age group to open "lives with" row.  Adult   Lives With spouse   Living Arrangements house   Able to Return to Prior Arrangements yes   Home Safety   Home Assessment: No Problems Identified   Home Accessibility bed and bath on same level;no concerns;stairs to enter home;stairs (1  railing present)   Custody and Legal Status   Do you have a court appointed guardian/conservator? No   Are you an emancipated minor? No   Care Management Plan   Discharge Planning Status initial meeting   Projected Discharge Date 03/26/21   Discharge plan discussed with: Patient;Spouse   CM will evaluate for rehabilitation potential yes   Patient choice offered to patient/family no   Form for patient choice reviewed/signed and on chart no   Patient aware of possible cost for ambulance transport?  No   Discharge Needs Assessment   Equipment Currently Used at Home none  (states has a cane, FWW, shower and BSC from another family member but he does not use)   Equipment Needed After Discharge other (see comments)  (TBD)   Discharge Facility/Level of Care Needs Home vs Home with Home Health   Transportation Available car;family or friend will provide   Referral Information   Admission Type inpatient   Address Verified verified-no changes   Arrived From home or self-care   ADVANCE DIRECTIVES   Does the Patient have an Advance Directive? Yes, Patient Does Have Advance Directive for Healthcare Treatment   Document the Substance of the  Advance Directive (Required) MPOA/LW   Type of Advance Directive Completed Medical Living Will;Medical Power of Attorney   Copy of Advance Directives in Chart? 0   Name of [place] or Healthcare Surrogate 1. [name]  2. [name]   Phone Number of MPOA or Healthcare Surrogate 1. [phone]  2. [phone]   Patient Requests Assistance in Having Advance Directive Notarized. N/A   Home Main Entrance   Number of Stairs, Main Entrance four   76 year old male admitted with IPMN. S/p robotic pancreatectomy and splenectomy. JP drain, MIVF, lab monitoring, medication management, endocrine consulted, pain control.     Discharge Plan:  Home vs home with [place] met with patient at bedside for initial assessment. Role of CM and facesheet reviewed. Pt states he lives with his wife in  a one story home and was independent in self care and medications prior to admission. Wife will provide transportation home. MPOA/LW in epic, lay caregiver addressed by bedside RN. PCP is at [facility] and last visit was one month ago. Pt is not active with home health and has DME at home (from a family member) but does not use. Pharmacy is [facility] and prescriptions are covered by the [place]. If patient requires new or changed medications at discharge, scripts will need sent to the [facility].     PT evaluated. Recommendations are for home with assist and no anticipated DME.     The patient will continue to be evaluated for developing discharge needs.     Case Manager: Liam Graham, CASE MANAGER  Phone: [phone]

## 2021-03-23 NOTE — Care Plan (Signed)
Problem: Adult Inpatient Plan of Care  Goal: Plan of Care Review  Outcome: Ongoing (see interventions/notes)  Goal: Patient-Specific Goal (Individualized)  Outcome: Ongoing (see interventions/notes)  Goal: Absence of Hospital-Acquired Illness or Injury  Outcome: Ongoing (see interventions/notes)  Intervention: Identify and Manage Fall Risk  Recent Flowsheet Documentation  Taken 03/22/2021 1949 by Talor Desrosiers, De Burrs, RN  Safety Promotion/Fall Prevention:   activity supervised   fall prevention program maintained   nonskid shoes/slippers when out of bed   motion sensor pad activated   safety round/check completed  Intervention: Prevent Skin Injury  Recent Flowsheet Documentation  Taken 03/22/2021 1949 by Turhan Chill, De Burrs, RN  Body Position:   semi-fowlers (30-45 degrees)   positioned/repositioned independently  Skin Protection:   adhesive use limited   transparent dressing maintained   tubing/devices free from skin contact   preventative decubiti skin protection foam dressing applied/intact  Intervention: Prevent and Manage VTE (Venous Thromboembolism) Risk  Recent Flowsheet Documentation  Taken 03/22/2021 1949 by Tenlee Wollin, De Burrs, RN  VTE Prevention/Management:   ambulation promoted   dorsiflexion/plantar flexion performed   sequential compression devices on  Intervention: Prevent Infection  Recent Flowsheet Documentation  Taken 03/22/2021 1949 by Kiona Blume, De Burrs, RN  Infection Prevention:   promote handwashing   rest/sleep promoted   barrier precautions utilized   personal protective equipment utilized   single patient room provided  Goal: Optimal Comfort and Wellbeing  Outcome: Ongoing (see interventions/notes)  Goal: Rounds/Family Conference  Outcome: Ongoing (see interventions/notes)     Problem: Fall Injury Risk  Goal: Absence of Fall and Fall-Related Injury  Outcome: Ongoing (see interventions/notes)  Intervention: Promote Injury-Free Environment  Recent Flowsheet Documentation  Taken 03/22/2021 1949 by Averiana Clouatre, De Burrs, RN  Safety Promotion/Fall Prevention:   activity supervised   fall prevention program maintained   nonskid shoes/slippers when out of bed   motion sensor pad activated   safety round/check completed

## 2021-03-23 NOTE — Progress Notes (Signed)
[facility]  Surgical Oncology Progress Note      [name], [name], 76 y.o. male  Date of Birth:  1945/12/06  Date of Admission:  03/22/2021  Date of service: 03/23/2021    Assessment:  This is a 76 y.o. 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    Plan/Recommendations:  - Endocrine consulted   - Preop A1C  9.3  - HIV history   - Antiviral reordered   - ID called, stated as long as we have his medication on formulary and it is reordered, they do not need to follow  - Diet: Advance to diabetic   - IVF: Decrease to 50 ml/hr, will discontinue if good PO intake  - Pain control: Tylenol, Oxycodone, ESP catheters, robaxin  - Last bowel movement: PTA, bowel regimen ordered  - Labs: Daily AM labs, drain amylase pending  - Nursing: OOB, IS  - Ppx   - GI: Home protonix       - DVT: Heparin, SCDs  - Lines/drains: JP drain, remove arterial line and foley  - Consults:  ID, endocrinology  - Activity: OOB at least 6 hours daily  - PT/OT: Ordered  - Dispo: Transfer to floor    Subjective: NAEO. Tolerating clear liquids without nausea or vomiting.     Objective:  Vitals:   Filed Vitals:    03/22/21 2303 03/22/21 2324 03/23/21 0130 03/23/21 0331   BP:       Pulse:       Resp:   16 15   Temp:  36.6 C (97.9 F)  36.8 C (98.2 F)   SpO2: 92%        Constitutional: Pleasant, NAD  HEENT: Normocephalic, atraumatic. No scleral icterus.   Neck: Trachea midline.   Cardiovascular: RRR. No peripheral edema.  Respiratory: Normal effort, chest expands symmetrically.   GI: Abdomen soft and supple; non-tender, non-distended. OR dressing in place over midline incision. JP drain with serosanguinous output.  Musculoskeletal: Moving all extremities.  Integumentary: Pink, warm, and dry.  Neurologic: Alert and oriented. No focal motor or sensory deficits  Psychiatric: Speech pattern and mood/affect normal.    Labs  Results for orders placed or performed  during the hospital encounter of 03/22/21 (from the past 24 hour(s))   ARTERIAL BLOOD GAS/LACTATE/CO-OX/LYTES (NA/K/CA/CL/GLUC) (TEMP COMP)   Result Value Ref Range    %FIO2 (ARTERIAL) 44 %    PH (ARTERIAL) 7.36 7.35 - 7.45    PCO2 (ARTERIAL) 39 35 - 45 mm/Hg    PO2 (ARTERIAL) 113 (H) 72 - 100 mm/Hg    BASE DEFICIT 3.2 (H) 0.0 - 3.0 mmol/L    BICARBONATE (ARTERIAL) 22.5 18.0 - 26.0 mmol/L    TEMPERATURE, COMP 36.9 15.0 - 40.0 C    PH (T) 7.36 7.35 - 7.45    (T) PCO2 39.0 35.0 - 45.0 mm/Hg    (T) PO2 112.0 (H) 72.0 - 100.0 mm/Hg    SODIUM 135 (L) 137 - 145 mmol/L    WHOLE BLOOD POTASSIUM 3.2 (L) 3.5 - 4.6 mmol/L    CHLORIDE 105 101 - 111 mmol/L    IONIZED CALCIUM 1.18 1.10 - 1.35 mmol/L    GLUCOSE 147 (H) 60 - 105 mg/dL    LACTATE 0.8 0.0 - 1.3 mmol/L    HEMOGLOBIN 11.0 (L) 12.0 - 18.0 g/dL    HEMATOCRITRT 33 %    OXYHEMOGLOBIN 97.6 85.0 - 98.0 %  CARBOXYHEMOGLOBIN 1.3 0.0 - 2.5 %    MET-HEMOGLOBIN 0.3 0.0 - 2.0 %    O2CT 15.3 (L) 15.7 - 24.3 %    PAO2/FIO2 RATIO 257 <=200   ARTERIAL BLOOD GAS/LACTATE/CO-OX/LYTES (NA/K/CA/CL/GLUC) (TEMP COMP)   Result Value Ref Range    %FIO2 (ARTERIAL) 37 %    PH (ARTERIAL) 7.36 7.35 - 7.45    PCO2 (ARTERIAL) 38 35 - 45 mm/Hg    PO2 (ARTERIAL) 117 (H) 72 - 100 mm/Hg    BASE DEFICIT 3.6 (H) 0.0 - 3.0 mmol/L    BICARBONATE (ARTERIAL) 22.1 18.0 - 26.0 mmol/L    TEMPERATURE, COMP 36.9 15.0 - 40.0 C    PH (T) 7.36 7.35 - 7.45    (T) PCO2 38.0 35.0 - 45.0 mm/Hg    (T) PO2 116.0 (H) 72.0 - 100.0 mm/Hg    SODIUM 135 (L) 137 - 145 mmol/L    WHOLE BLOOD POTASSIUM 3.6 3.5 - 4.6 mmol/L    CHLORIDE 106 101 - 111 mmol/L    IONIZED CALCIUM 1.15 1.10 - 1.35 mmol/L    GLUCOSE 202 (H) 60 - 105 mg/dL    LACTATE 1.0 0.0 - 1.3 mmol/L    HEMOGLOBIN 11.1 (L) 12.0 - 18.0 g/dL    HEMATOCRITRT 33 %    OXYHEMOGLOBIN 97.8 85.0 - 98.0 %    CARBOXYHEMOGLOBIN 1.8 0.0 - 2.5 %    MET-HEMOGLOBIN 0.4 0.0 - 2.0 %    O2CT 15.5 (L) 15.7 - 24.3 %    PAO2/FIO2 RATIO 316 <=200   CBC/DIFF    Narrative    The following  orders were created for panel order CBC/DIFF.  Procedure                               Abnormality         Status                     ---------                               -----------         ------                     CBC WITH HDQQ[[id number]]                Abnormal            Final result                 Please view results for these tests on the individual orders.   MAGNESIUM   Result Value Ref Range    MAGNESIUM 2.0 1.8 - 2.6 mg/dL   PHOSPHORUS   Result Value Ref Range    PHOSPHORUS 3.5 2.3 - 4.0 mg/dL   BASIC METABOLIC PANEL   Result Value Ref Range    SODIUM 137 136 - 145 mmol/L    POTASSIUM 4.5 3.5 - 5.1 mmol/L    CHLORIDE 105 96 - 111 mmol/L    CO2 TOTAL 22 (L) 23 - 31 mmol/L    ANION GAP 10 4 - 13 mmol/L    CALCIUM 8.1 (L) 8.8 - 10.2 mg/dL    GLUCOSE 147 (H) 65 - 125 mg/dL    BUN 22 8 - 25 mg/dL    CREATININE 1.10 0.75 - 1.35 mg/dL  BUN/CREA RATIO 20 6 - 22    ESTIMATED GFR 70 >=60 mL/min/BSA   CBC WITH DIFF   Result Value Ref Range    WBC 12.7 (H) 3.7 - 11.0 x10^3/uL    RBC 3.56 (L) 4.50 - 6.10 x10^6/uL    HGB 11.0 (L) 13.4 - 17.5 g/dL    HCT 32.3 (L) 38.9 - 52.0 %    MCV 90.7 78.0 - 100.0 fL    MCH 30.9 26.0 - 32.0 pg    MCHC 34.1 31.0 - 35.5 g/dL    RDW-CV 16.1 (H) 11.5 - 15.5 %    PLATELETS 115 (L) 150 - 400 x10^3/uL    NEUTROPHIL % 83 %    LYMPHOCYTE % 9 %    MONOCYTE % 7 %    EOSINOPHIL % 0 %    BASOPHIL % 0 %    NEUTROPHIL # 10.42 (H) 1.50 - 7.70 x10^3/uL    LYMPHOCYTE # 1.18 1.00 - 4.80 x10^3/uL    MONOCYTE # 0.91 0.20 - 1.10 x10^3/uL    EOSINOPHIL # <0.10 <=0.50 x10^3/uL    BASOPHIL # <0.10 <=0.20 x10^3/uL    IMMATURE GRANULOCYTE % 1 0 - 1 %    IMMATURE GRANULOCYTE # 0.14 (H) <0.10 x10^3/uL    Narrative    Mean Platelet Volume - Not Reported   TYPE AND CROSS RED CELLS - UNITS , 2 Units   Result Value Ref Range    UNITS ORDERED 2     SPECIMEN EXPIRATION DATE 03/25/2021,2359     ABO/RH(D) A NEGATIVE     ANTIBODY SCREEN NEGATIVE    BPAM PACKED CELL ORDER   Result Value Ref Range    Coding System  [id number]     UNIT NUMBER [id number]     BLOOD COMPONENT TYPE LR RBC, Adsol1, 04710     UNIT DIVISION 00     UNIT DISPENSE STATUS RELEASED FROM ALLOCATION     TRANSFUSION STATUS OK TO TRANSFUSE     IS CROSSMATCH Electronically Compatible     Product Code U1324M01     Coding System [id number]     UNIT NUMBER [id number]     BLOOD COMPONENT TYPE LR RBC, Adsol1, 04710     UNIT DIVISION 00     UNIT DISPENSE STATUS RELEASED FROM ALLOCATION     TRANSFUSION STATUS OK TO TRANSFUSE     IS CROSSMATCH Electronically Compatible     Product Code V4259D63    POC BLOOD GLUCOSE (RESULTS)   Result Value Ref Range    GLUCOSE, POC 200 (H) 70 - 105 mg/dl   POC BLOOD GLUCOSE (RESULTS)   Result Value Ref Range    GLUCOSE, POC 202 (H) 70 - 105 mg/dl   POC BLOOD GLUCOSE (RESULTS)   Result Value Ref Range    GLUCOSE, POC 177 (H) 70 - 105 mg/dl       I/O:    Intake/Output Summary (Last 24 hours) at 03/23/2021 0644  Last data filed at 03/23/2021 8756  Gross per 24 hour   Intake 2270 ml   Output 2475 ml   Net -205 ml        Current Medications:  Current Facility-Administered Medications   Medication Dose Route Frequency    acetaminophen (TYLENOL) tablet  975 mg Oral 4x/day    [Held by provider] amLODIPine (NORVASC) tablet  5 mg Oral NIGHTLY    atorvastatin (LIPITOR) tablet  40 mg Oral Daily    cetirizine (ZYRTEC) tablet  10 mg Oral Daily  D5W 250 mL flush bag   Intravenous Q15 Min PRN    electrolyte-A (PLASMALYTE-A) premix infusion   Intravenous Continuous    gabapentin (NEURONTIN) capsule  300 mg Oral 2x/day    heparin 5,000 unit/mL injection  5,000 Units Subcutaneous Q8HRS    HYDROmorphone (DILAUDID) 1 mg/mL injection  0.2 mg Intravenous Q4H PRN    insulin glargine-yfgn 100 units/mL injection  15 Units Subcutaneous NIGHTLY    insulin lispro (HUMALOG) 100 units/mL injection  2 Units Subcutaneous 3x/day AC    levothyroxine (SYNTHROID) tablet  75 mcg Oral QAM    lidocaine PF (XYLOCAINE) 0.4% in NS 250 mL (tot vol) nerve plexus infusion w/  programmed intermittent boluses   Local Infiltration Continuous    lidocaine PF (XYLOCAINE) 0.4% in NS 250 mL (tot vol) nerve plexus infusion w/ programmed intermittent boluses   Local Infiltration Continuous    magnesium oxide (MAG-OX) 437m (2417melemental magnesium) tablet  400 mg Oral 2x/day    NS flush syringe  2-6 mL Intracatheter Q8HRS    ondansetron (ZOFRAN) 2 mg/mL injection  4 mg Intravenous Q6H PRN    oxyCODONE (ROXICODONE) immediate release tablet  5 mg Oral Q4H PRN    Or    oxyCODONE (ROXICODONE) immediate release tablet  10 mg Oral Q4H PRN    pantoprazole (PROTONIX) delayed release tablet  40 mg Oral Daily    polyethylene glycol (MIRALAX) oral packet  17 g Oral 2x/day    sennosides-docusate sodium (SENOKOT-S) 8.6-50mg per tablet  2 Tablet Oral 2x/day    simethicone (MYLICON) chewable tablet  80 mg Oral Q6H PRN    SSIP insulin lispro 100 units/mL injection  0-12 Units Subcutaneous 4x/day PRN    sterile water irrigation  1,000 mL Irrigation INTRA-OP Q1H PRN       Pathology:  Pending    PaCameron AliPA-C  03/23/2021, 06:44    I personally saw and examined the patient. See physician's assistant note for additional details.     BrLynwood DawleyMD

## 2021-03-23 NOTE — Consults (Signed)
[facility]  Department of Endocrinology  Diabetes Consult     Name/MRN: [name], [id number]  Age/DOB: 76 y.o., [birth year]  Date of service:03/23/2021    Reason for Consult:  Diabetes  Chief Complaint:  No chief complaint on file.        HPI:  [name] is a 76 y.o. 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 Endocrinology is consulted for management of type 2 diabetes mellitus.  Most recent HbA1c on admission was 9.2.    Per patient, he was never aware that he was diabetic and was recently diagnosed after his A1c was noted to be 9.2.    He is not taking any medications for management of his blood sugars.  He was noted to have a pancreatic tail mucinous cyst, for which he underwent open distal pancreatectomy and splenectomy on 20th February 2023.  He denies ever being diagnosed with diabetic ketoacidosis.  Has an extensive family history of diabetes.          ROS: All systems reviewed and negative except for as above.    Past Medical History:  Past Medical History:   Diagnosis Date    Cancer (CMS HCC)     prostate    Deep vein thrombosis (DVT) (CMS HCC)     right leg    Esophageal reflux     H/O hearing loss     High cholesterol     History of kidney disease     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    HTN (hypertension)     Human immunodeficiency virus (HIV) disease (CMS HCC)     Hyperlipidemia     "borderline"    Hypothyroidism     MRSA (methicillin resistant staph aureus) culture positive 01/02/2021    MRSA left groin abscess 01/03/21    MRSA (methicillin resistant staph aureus) culture positive 01/02/2021    MRSA blood 01/02/21    Thyroid disorder     Wears glasses        Past Surgical History:  Past Surgical History:   Procedure Laterality Date    COLON SURGERY      per patient had part of colon removed and had a colostomy    COLONOSCOPY       GASTROSCOPY      HIP SURGERY Right 04/22/2020    Right hip arthrotomy 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 Darryl Nestle CERVICAL SPINE SURGERY  2001    HX COLOSTOMY REVERSAL      HX HERNIA REPAIR      KNEE SURGERY Bilateral     WRIST SURGERY Right        Home Medications:  Outpatient Medications Marked as Taking for the 03/22/21 encounter [facility] Encounter)   Medication Sig    [DISCONTINUED] cholecalciferol, vitamin D3, 25 mcg (1,000 unit) Oral Tablet Take 1 Tablet (1,000 Units total) by mouth Once a day    [DISCONTINUED] DULoxetine (CYMBALTA DR) 20 mg Oral Capsule, Delayed Release(E.C.) Take 2 Capsules (40 mg total) by mouth Every night    [DISCONTINUED] omega-3-DHA-EPA-fish oil 1,000 mg (120 mg-180 mg) Oral Capsule Take by mouth Once a day       Inpatient Medications:    Current Facility-Administered Medications:     acetaminophen (TYLENOL) tablet, 975 mg, Oral, 4x/day, Bronikowski, Diane, MD, 975 mg at 03/23/21 1326    [  Held by provider] amLODIPine (NORVASC) tablet, 5 mg, Oral, NIGHTLY, Bronikowski, Diane, MD    atorvastatin (LIPITOR) tablet, 40 mg, Oral, Daily, Bronikowski, Diane, MD, 40 mg at 03/22/21 2101    cetirizine (ZYRTEC) tablet, 10 mg, Oral, Daily, Bronikowski, Diane, MD, 10 mg at 03/23/21 0802    D5W 250 mL flush bag, , Intravenous, Q15 Min PRN, Loleta Rose, MD    electrolyte-A (PLASMALYTE-A) premix infusion, , Intravenous, Continuous, Cameron Ali, PA-C, Last Rate: 50 mL/hr at 03/23/21 0748, Rate Change at 03/23/21 0748    elvitegravir-cobicistat-emtricitrabine-tenofovir (GENVOYA) 150-150-200-10 mg per tablet, 1 Tablet, Oral, Daily with Dinner, Kennon Rounds, Arby Barrette, PA-C    gabapentin (NEURONTIN) capsule, 300 mg, Oral, 2x/day, Bronikowski, Diane, MD, 300 mg at 03/23/21 0802    heparin 5,000 unit/mL injection, 5,000 Units, Subcutaneous, Q8HRS, Bronikowski, Diane, MD, 5,000 Units at 03/23/21 1326    HYDROmorphone (DILAUDID) 1 mg/mL injection, 0.2 mg, Intravenous, Q4H PRN, Mariella Saa, MD, 0.2 mg at 03/23/21 1327    insulin glargine-yfgn 100 units/mL injection, 15 Units, Subcutaneous, NIGHTLY, Madahar, Inderpreet, MD    insulin lispro (HUMALOG) 100 units/mL injection, 2 Units, Subcutaneous, 3x/day AC, Madahar, Inderpreet, MD, 2 Units at 03/23/21 1229    levothyroxine (SYNTHROID) tablet, 75 mcg, Oral, QAM, Bronikowski, Diane, MD, 75 mcg at 03/23/21 0511    lidocaine PF (XYLOCAINE) 0.4% in NS 250 mL (tot vol) nerve plexus infusion w/ programmed intermittent boluses, , Local Infiltration, Continuous, Cleatrice Burke, MD, Solution Verified at 03/23/21 0704    magnesium oxide (MAG-OX) 400mg  (240mg  elemental magnesium) tablet, 400 mg, Oral, 2x/day, Bronikowski, Diane, MD, 400 mg at 03/23/21 0900    methocarbamol (ROBAXIN) tablet, 500 mg, Oral, 4x/day, Kennon Rounds, Paige, PA-C, 500 mg at 03/23/21 1326    NS flush syringe, 2-6 mL, Intracatheter, Q8HRS, Loleta Rose, MD    ondansetron (ZOFRAN) 2 mg/mL injection, 4 mg, Intravenous, Q6H PRN, Eligah East, Diane, MD    oxyCODONE (ROXICODONE) immediate release tablet, 5 mg, Oral, Q4H PRN, 5 mg at 03/23/21 0802 **OR** oxyCODONE (ROXICODONE) immediate release tablet, 10 mg, Oral, Q4H PRN, Eligah East, Diane, MD, 10 mg at 03/23/21 1231    pantoprazole (PROTONIX) delayed release tablet, 40 mg, Oral, Daily, Bronikowski, Diane, MD, 40 mg at 03/23/21 0511    polyethylene glycol (MIRALAX) oral packet, 17 g, Oral, 2x/day, Bronikowski, Diane, MD, 17 g at 03/23/21 0801    sennosides-docusate sodium (SENOKOT-S) 8.6-50mg  per tablet, 2 Tablet, Oral, 2x/day, Bronikowski, Diane, MD, 2 Tablet at 03/23/21 0802    simethicone (MYLICON) chewable tablet, 80 mg, Oral, Q6H PRN, Mariella Saa, MD, 80 mg at 03/23/21 0511    SSIP insulin lispro 100 units/mL injection, 0-12 Units, Subcutaneous, 4x/day PRN, 4 Units at 03/23/21 1229 **AND** POCT WHOLE BLOOD GLUCOSE, , , TID AC & HS, Bronikowski, Diane, MD     Allergies:  No Known Allergies     Family History:  Family Medical  History:       Problem Relation (Age of Onset)    Cancer Mother, Father, Other    Heart Attack Mother    High Cholesterol Other    Stroke Other              Social History:  Social History     Tobacco Use    Smoking status: Never    Smokeless tobacco: Never   Vaping Use    Vaping Use: Never used   Substance Use Topics    Alcohol use: Not Currently    Drug use: Never  Physical Exam:  General: Well appearing male in no acute distress.  Psychiatric: Normal mood and affect  Neuro: Alert and oriented x 3. Speech fluent. Cranial nerves II-XII intact. No focal deficits noted.  HEENT: Head normocephalic, atraumatic. PERRLA, EOMI, conjunctiva clear. Oral mucosa pink and moist.   Thyroid/Neck: Trachea midline  Lungs: Normal respiratory effort.   Cardiac: Regular rate and rhythm  Abdomen: Non-distended  Extremities: No edema noted bilaterally  Skin: No visible rashes.      Data Reviewed:  I have reviewed previous labs, tests, imaging, and notes.  Lab Results   Component Value Date    HA1C 9.3 (H) 03/17/2021     Recent Labs     03/22/21  1745 03/22/21  2058 03/22/21  2232 03/23/21  0650 03/23/21  1220   GLUIP 200* 202* 177* 137* 249*           Assessment/Plan:   [name] is a 76 y.o. 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 Endocrinology is consulted for management of type 2 diabetes mellitus.    Type 2 Diabetes Mellitus, uncontrolled, management complicated by severe hyperglycemia in hospital and subtotal pancreatectomy.  -Most recent HbA1c on admission  was 9.3.  -Home regimen:  None  -Current inpatient regimen:  Lantus 15 units, Humalog 5 units t.i.d. a.c., conservative sliding scale.  -Current diet: DIET DIABETIC Calorie amount: CC 2200   -Recommend   Patient did not receive any Lantus 15 units ordered for last night.    Will recommend to start with Lantus 15 units  daily, and continue Humalog 5 units t.i.d. a.c. with a conservative sliding scale.  He is noted to be hyperglycemic postprandially, but his blood sugars are expected to improve once Lantus is started.    Will tailor his insulin regimen according to blood sugars in the hospital, please page if questions arise.        Marcheta Grammes, MD 03/23/2021, 15:24  Fellow, Endocrinology & Metabolism  [facility]  --    I saw and examined the patient.  I reviewed the fellow's note.  I agree with the findings and plan of care as documented in the fellow's note.  Any exceptions/additions are edited/noted.    Dr. Cleatrice Burke  Clinical Assistant Professor  Department of Endocrinology

## 2021-03-23 NOTE — Progress Notes (Signed)
CRUZ BONG  [birth year]  [id number]    Interval progress note:  Right ESP catheter has been intermittently but persistently beeping throughout the day. The line was disconnected and primed. It was also flushed at the distal end. The pump continued to beep after these interventions. We ultimately decided to remove the right ESP catheter. Blue tip was intact upon removal. The left ESP catheter remains in place.         Rada Hay, MD 03/23/2021  PGY-4  [facility]  Department of Anesthesiology

## 2021-03-24 DIAGNOSIS — Z9081 Acquired absence of spleen: Secondary | ICD-10-CM

## 2021-03-24 DIAGNOSIS — Z90411 Acquired partial absence of pancreas: Secondary | ICD-10-CM

## 2021-03-24 DIAGNOSIS — Z794 Long term (current) use of insulin: Secondary | ICD-10-CM

## 2021-03-24 DIAGNOSIS — E1165 Type 2 diabetes mellitus with hyperglycemia: Secondary | ICD-10-CM

## 2021-03-24 LAB — CBC WITH DIFF
BASOPHIL #: <0.1 10*3/uL (ref <=0.20)
BASOPHIL %: 0 %
EOSINOPHIL #: <0.1 10*3/uL (ref <=0.50)
EOSINOPHIL %: 0 %
HCT: 33.6 % — ABNORMAL LOW (ref 38.9–52.0)
HGB: 11.2 g/dL — ABNORMAL LOW (ref 13.4–17.5)
IMMATURE GRANULOCYTE #: 0.26 10*3/uL — ABNORMAL HIGH (ref <0.10)
IMMATURE GRANULOCYTE %: 2 % — ABNORMAL HIGH (ref 0–1)
LYMPHOCYTE #: 1.69 10*3/uL (ref 1.00–4.80)
LYMPHOCYTE %: 13 %
MCH: 30.8 pg (ref 26.0–32.0)
MCHC: 33.3 g/dL (ref 31.0–35.5)
MCV: 92.3 fL (ref 78.0–100.0)
MONOCYTE #: 0.66 10*3/uL (ref 0.20–1.10)
MONOCYTE %: 5 %
MPV: 11.3 fL (ref 8.7–12.5)
NEUTROPHIL #: 10.52 10*3/uL — ABNORMAL HIGH (ref 1.50–7.70)
NEUTROPHIL %: 80 %
PLATELETS: 119 10*3/uL — ABNORMAL LOW (ref 150–400)
RBC: 3.64 10*6/uL — ABNORMAL LOW (ref 4.50–6.10)
RDW-CV: 16.3 % — ABNORMAL HIGH (ref 11.5–15.5)
WBC: 13.2 10*3/uL — ABNORMAL HIGH (ref 3.7–11.0)

## 2021-03-24 LAB — BASIC METABOLIC PANEL
ANION GAP: 9 mmol/L (ref 4–13)
BUN/CREA RATIO: 18 (ref 6–22)
BUN: 19 mg/dL (ref 8–25)
CALCIUM: 9.1 mg/dL (ref 8.8–10.2)
CHLORIDE: 101 mmol/L (ref 96–111)
CO2 TOTAL: 24 mmol/L (ref 23–31)
CREATININE: 1.04 mg/dL (ref 0.75–1.35)
ESTIMATED GFR: 75 mL/min/BSA (ref >=60)
GLUCOSE: 104 mg/dL (ref 65–125)
POTASSIUM: 3.7 mmol/L (ref 3.5–5.1)
SODIUM: 134 mmol/L — ABNORMAL LOW (ref 136–145)

## 2021-03-24 LAB — POC BLOOD GLUCOSE (RESULTS)
GLUCOSE, POC: 123 mg/dl — ABNORMAL HIGH (ref 70–105)
GLUCOSE, POC: 193 mg/dl — ABNORMAL HIGH (ref 70–105)
GLUCOSE, POC: 177 mg/dl — ABNORMAL HIGH (ref 70–105)
GLUCOSE, POC: 169 mg/dl — ABNORMAL HIGH (ref 70–105)
GLUCOSE, POC: 175 mg/dl — ABNORMAL HIGH (ref 70–105)

## 2021-03-24 LAB — PHOSPHORUS: PHOSPHORUS: 2.2 mg/dL — ABNORMAL LOW (ref 2.3–4.0)

## 2021-03-24 LAB — MAGNESIUM: MAGNESIUM: 2.1 mg/dL (ref 1.8–2.6)

## 2021-03-24 MED ORDER — INSULIN LISPRO (U-100) 100 UNIT/ML SUBCUTANEOUS SOLUTION
4.0000 [IU] | Freq: Three times a day (TID) | SUBCUTANEOUS | Status: DC
Start: 2021-03-24 — End: 2021-03-25
  Administered 2021-03-24 – 2021-03-25 (×4): 4 [IU] via SUBCUTANEOUS
  Filled 2021-03-24 (×4): qty 12

## 2021-03-24 MED ORDER — SODIUM PHOSPHATE 3 MMOL/ML INTRAVENOUS SOLUTION
30.0000 mmol | Freq: Once | INTRAVENOUS | Status: AC
Start: 2021-03-24 — End: 2021-03-24
  Administered 2021-03-24: 30 mmol via INTRAVENOUS
  Administered 2021-03-24: 0 mmol via INTRAVENOUS
  Filled 2021-03-24: qty 10

## 2021-03-24 NOTE — Progress Notes (Deleted)
[facility]  Surgical Oncology Progress Note      [name], [name], 76 y.o. male  Date of Birth:  [birth year]  Date of Admission:  03/22/2021  Date of service: 03/24/2021    Assessment:  This is a 76 y.o. 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    Plan/Recommendations:  - Endocrine consulted - following recs of 15 U Lantus daily and 3 U Humalog t.i.d with SSI  - HIV history - ID does not need to follow - continue genvoya  - Drain amylase 6554, continue to monitor JP output due to concern for pancreatic leak  - Diet: Diabetic  - IVF: discontinue fluids today  - Pain control: Tylenol, Oxycodone, robaxin - ESP catheter removed yesterday  - Nursing: OOB, IS  - Ppx   - GI: Home protonix       - DVT: Heparin, SCDs  - Lines/drains: JP drain  - Consults:  ID, endocrinology  - Activity: OOB at least 6 hours daily  - PT/OT: Ordered  - Dispo: Transfer to floor  - Care management: home vs home with home health    Subjective: NAEO. Tolerating regular diet. Encouraged OOB activity as much as possible.    Objective:  Vitals:   Filed Vitals:    03/23/21 2117 03/23/21 2120 03/24/21 0018 03/24/21 0342   BP: 117/73  133/77 (!) 144/82   Pulse: 91  94 94   Resp: 17  16 16    Temp: 36.8 C (98.2 F)  36.8 C (98.2 F) 36.9 C (98.4 F)   SpO2: (!) 89% 92% 91% 91%     Constitutional: Pleasant, NAD  HEENT: Normocephalic, atraumatic. No scleral icterus.   Neck: Trachea midline.   Cardiovascular: RRR. No peripheral edema.  Respiratory: Normal effort, chest expands symmetrically.   GI: Abdomen soft and supple; non-tender, non-distended. OR dressing in place over midline incision. JP drain with serosanguinous output.  Musculoskeletal: Moving all extremities.  Integumentary: Pink, warm, and dry.  Neurologic: Alert and oriented. No focal motor or sensory deficits  Psychiatric: Speech pattern and mood/affect normal.    Labs  Results for orders placed  or performed during the hospital encounter of 03/22/21 (from the past 24 hour(s))   AMYLASE BODY FLUID   Result Value Ref Range    AMYLASE BODY FLUID >6,554 No reference intervals are established. U/L   CBC/DIFF    Narrative    The following orders were created for panel order CBC/DIFF.  Procedure                               Abnormality         Status                     ---------                               -----------         ------                     CBC WITH RUEA[[id number]]  Please view results for these tests on the individual orders.   POC BLOOD GLUCOSE (RESULTS)   Result Value Ref Range    GLUCOSE, POC 137 (H) 70 - 105 mg/dl   POC BLOOD GLUCOSE (RESULTS)   Result Value Ref Range    GLUCOSE, POC 249 (H) 70 - 105 mg/dl   POC BLOOD GLUCOSE (RESULTS)   Result Value Ref Range    GLUCOSE, POC 192 (H) 70 - 105 mg/dl   POC BLOOD GLUCOSE (RESULTS)   Result Value Ref Range    GLUCOSE, POC 190 (H) 70 - 105 mg/dl       I/O:    Intake/Output Summary (Last 24 hours) at 03/24/2021 0626  Last data filed at 03/24/2021 0342  Gross per 24 hour   Intake 770 ml   Output 1517 ml   Net -747 ml        Current Medications:  Current Facility-Administered Medications   Medication Dose Route Frequency   . acetaminophen (TYLENOL) tablet  975 mg Oral 4x/day   . [Held by provider] amLODIPine (NORVASC) tablet  5 mg Oral NIGHTLY   . atorvastatin (LIPITOR) tablet  40 mg Oral Daily   . cetirizine (ZYRTEC) tablet  10 mg Oral Daily   . D5W 250 mL flush bag   Intravenous Q15 Min PRN   . electrolyte-A (PLASMALYTE-A) premix infusion   Intravenous Continuous   . elvitegravir-cobicistat-emtricitrabine-tenofovir (GENVOYA) 150-150-200-10 mg per tablet  1 Tablet Oral Daily with Dinner   . gabapentin (NEURONTIN) capsule  300 mg Oral 2x/day   . heparin 5,000 unit/mL injection  5,000 Units Subcutaneous Q8HRS   . HYDROmorphone (DILAUDID) 1 mg/mL injection  0.2 mg Intravenous Q4H PRN   .  insulin glargine-yfgn 100 units/mL injection  15 Units Subcutaneous NIGHTLY   . insulin lispro (HUMALOG) 100 units/mL injection  2 Units Subcutaneous 3x/day AC   . levothyroxine (SYNTHROID) tablet  75 mcg Oral QAM   . lidocaine PF (XYLOCAINE) 0.4% in NS 250 mL (tot vol) nerve plexus infusion w/ programmed intermittent boluses   Local Infiltration Continuous   . magnesium oxide (MAG-OX) 400mg  (240mg  elemental magnesium) tablet  400 mg Oral 2x/day   . methocarbamol (ROBAXIN) tablet  500 mg Oral 4x/day   . NS flush syringe  2-6 mL Intracatheter Q8HRS   . ondansetron (ZOFRAN) 2 mg/mL injection  4 mg Intravenous Q6H PRN   . oxyCODONE (ROXICODONE) immediate release tablet  5 mg Oral Q4H PRN    Or   . oxyCODONE (ROXICODONE) immediate release tablet  10 mg Oral Q4H PRN   . pantoprazole (PROTONIX) delayed release tablet  40 mg Oral Daily   . polyethylene glycol (MIRALAX) oral packet  17 g Oral 2x/day   . sennosides-docusate sodium (SENOKOT-S) 8.6-50mg  per tablet  2 Tablet Oral 2x/day   . simethicone (MYLICON) chewable tablet  80 mg Oral Q6H PRN   . SSIP insulin lispro 100 units/mL injection  0-12 Units Subcutaneous 4x/day PRN       Pathology:  Pending    Felicity Coyer, MED STUDENT, MS3, 03/24/2021, 06:26

## 2021-03-24 NOTE — Consults (Signed)
[facility]  Department of Endocrinology  Diabetes Consult     Name/MRN: [name], [id number]  Age/DOB: 76 y.o., Mar 09, 1945  Date of service:03/24/2021    Reason for Consult:  Diabetes  Chief Complaint:  No chief complaint on file.    Subjective:  Patient seen and examined today.  Sleeping comfortably in the bed.    HPI:  [name] is a 76 y.o. 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 Endocrinology is consulted for management of type 2 diabetes mellitus.  Most recent HbA1c on admission was 9.2.    Per patient, he was never aware that he was diabetic and was recently diagnosed after his A1c was noted to be 9.2.    He is not taking any medications for management of his blood sugars.  He was noted to have a pancreatic tail mucinous cyst, for which he underwent open distal pancreatectomy and splenectomy on 20th February 2023.  He denies ever being diagnosed with diabetic ketoacidosis.  Has an extensive family history of diabetes.          ROS: All systems reviewed and negative except for as above.    Past Medical History:  Past Medical History:   Diagnosis Date    Cancer (CMS HCC)     prostate    Deep vein thrombosis (DVT) (CMS HCC)     right leg    Esophageal reflux     H/O hearing loss     High cholesterol     History of kidney disease     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    HTN (hypertension)     Human immunodeficiency virus (HIV) disease (CMS HCC)     Hyperlipidemia     "borderline"    Hypothyroidism     MRSA (methicillin resistant staph aureus) culture positive 01/02/2021    MRSA left groin abscess 01/03/21    MRSA (methicillin resistant staph aureus) culture positive 01/02/2021    MRSA blood 01/02/21    Thyroid disorder     Wears glasses        Past Surgical History:  Past Surgical History:   Procedure Laterality Date    COLON SURGERY       per patient had 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 Darryl Nestle CERVICAL SPINE SURGERY  2001    HX COLOSTOMY REVERSAL      HX HERNIA REPAIR      KNEE SURGERY Bilateral     WRIST SURGERY Right        Home Medications:  Outpatient Medications Marked as Taking for the 03/22/21 encounter [facility] Encounter)   Medication Sig    [DISCONTINUED] cholecalciferol, vitamin D3, 25 mcg (1,000 unit) Oral Tablet Take 1 Tablet (1,000 Units total) by mouth Once a day    [DISCONTINUED] DULoxetine (CYMBALTA DR) 20 mg Oral Capsule, Delayed Release(E.C.) Take 2 Capsules (40 mg total) by mouth Every night    [DISCONTINUED] omega-3-DHA-EPA-fish oil 1,000 mg (120 mg-180 mg) Oral Capsule Take by mouth Once a day       Inpatient Medications:    Current Facility-Administered Medications:     acetaminophen (TYLENOL) tablet, 975 mg, Oral,  Terrilee Croak, MD, 975 mg at 03/24/21 1225    [Held by provider] amLODIPine (NORVASC) tablet, 5 mg, Oral, NIGHTLY, Bronikowski, Diane, MD    atorvastatin (LIPITOR) tablet, 40 mg, Oral, Daily, Bronikowski, Diane, MD, 40 mg at 03/23/21 2008    cetirizine (ZYRTEC) tablet, 10 mg, Oral, Daily, Bronikowski, Diane, MD, 10 mg at 03/24/21 0821    D5W 250 mL flush bag, , Intravenous, Q15 Min PRN, Loleta Rose, MD    elvitegravir-cobicistat-emtricitrabine-tenofovir (GENVOYA) 150-150-200-10 mg per tablet, 1 Tablet, Oral, Daily with Cline Crock, Arby Barrette, PA-C, 1 Tablet at 03/23/21 1809    gabapentin (NEURONTIN) capsule, 300 mg, Oral, 2x/day, Bronikowski, Diane, MD, 300 mg at 03/24/21 8413    heparin 5,000 unit/mL injection, 5,000 Units, Subcutaneous, Q8HRS, Bronikowski, Diane, MD, 5,000 Units at 03/24/21 1225    HYDROmorphone (DILAUDID) 1 mg/mL injection, 0.2 mg, Intravenous, Q4H PRN, Mariella Saa, MD, 0.2 mg at 03/24/21 0059    insulin  glargine-yfgn 100 units/mL injection, 15 Units, Subcutaneous, NIGHTLY, Madahar, Inderpreet, MD, 15 Units at 03/23/21 2132    insulin lispro (HUMALOG) 100 units/mL injection, 4 Units, Subcutaneous, 3x/day AC, Madahar, Inderpreet, MD, 4 Units at 03/24/21 1225    levothyroxine (SYNTHROID) tablet, 75 mcg, Oral, QAM, Bronikowski, Diane, MD, 75 mcg at 03/24/21 0604    magnesium oxide (MAG-OX) 400mg  (240mg  elemental magnesium) tablet, 400 mg, Oral, 2x/day, Bronikowski, Diane, MD, 400 mg at 03/24/21 0900    methocarbamol (ROBAXIN) tablet, 500 mg, Oral, 4x/day, Kennon Rounds, Paige, PA-C, 500 mg at 03/24/21 1225    NS flush syringe, 2-6 mL, Intracatheter, Q8HRS, Loleta Rose, MD, 5 mL at 03/24/21 0604    ondansetron (ZOFRAN) 2 mg/mL injection, 4 mg, Intravenous, Q6H PRN, Eligah East, Diane, MD    oxyCODONE (ROXICODONE) immediate release tablet, 5 mg, Oral, Q4H PRN, 5 mg at 03/23/21 0802 **OR** oxyCODONE (ROXICODONE) immediate release tablet, 10 mg, Oral, Q4H PRN, Eligah East, Diane, MD, 10 mg at 03/24/21 0358    pantoprazole (PROTONIX) delayed release tablet, 40 mg, Oral, Daily, Bronikowski, Diane, MD, 40 mg at 03/24/21 0604    polyethylene glycol (MIRALAX) oral packet, 17 g, Oral, 2x/day, Bronikowski, Diane, MD, 17 g at 03/24/21 2440    sennosides-docusate sodium (SENOKOT-S) 8.6-50mg  per tablet, 2 Tablet, Oral, 2x/day, Bronikowski, Diane, MD, 2 Tablet at 03/24/21 1027    simethicone (MYLICON) chewable tablet, 80 mg, Oral, Q6H PRN, Mariella Saa, MD, 80 mg at 03/23/21 2536    sodium phosphate 30 mmol in D5W 100 mL IVPB, 30 mmol, Intravenous, Once, Kennon Rounds, Paige, PA-C, Last Rate: 28 mL/hr at 03/24/21 1418, 30 mmol at 03/24/21 1418    SSIP insulin lispro 100 units/mL injection, 0-12 Units, Subcutaneous, 4x/day PRN, 2 Units at 03/24/21 1225 **AND** POCT WHOLE BLOOD GLUCOSE, , , TID AC & HS, Bronikowski, Diane, MD     Allergies:  No Known Allergies     Family History:  Family Medical History:       Problem Relation (Age of Onset)     Cancer Mother, Father, Other    Heart Attack Mother    High Cholesterol Other    Stroke Other              Social History:  Social History     Tobacco Use    Smoking status: Never    Smokeless tobacco: Never   Vaping Use    Vaping Use: Never used   Substance Use Topics    Alcohol use: Not Currently    Drug use: Never  Physical Exam:  General: Well appearing male in no acute distress.  Psychiatric: Normal mood and affect  Neuro: Alert and oriented x 3. Speech fluent. Cranial nerves II-XII intact. No focal deficits noted.  HEENT: Head normocephalic, atraumatic. PERRLA, EOMI, conjunctiva clear. Oral mucosa pink and moist.   Thyroid/Neck: Trachea midline  Lungs: Normal respiratory effort.   Cardiac: Regular rate and rhythm  Abdomen: Non-distended  Extremities: No edema noted bilaterally  Skin: No visible rashes.      Data Reviewed:  I have reviewed previous labs, tests, imaging, and notes.  Lab Results   Component Value Date    HA1C 9.3 (H) 03/17/2021     Recent Labs     03/22/21  1745 03/22/21  2058 03/22/21  2232 03/23/21  0650 03/23/21  1220 03/23/21  1755 03/23/21  2109 03/24/21  0635 03/24/21  1219   GLUIP 200* 202* 177* 137* 249* 192* 190* 123* 193*           Assessment/Plan:   [name] is a 76 y.o. 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 Endocrinology is consulted for management of type 2 diabetes mellitus.    Type 2 Diabetes Mellitus, uncontrolled, management complicated by severe hyperglycemia in hospital and subtotal pancreatectomy.  -Most recent HbA1c on admission  was 9.3.  -Home regimen:  None  -Current inpatient regimen:  Lantus 15 units, Humalog 5 units t.i.d. a.c., conservative sliding scale.  -Current diet: DIET DIABETIC Calorie amount: CC 2200   -Recommend   At this time, will not recommend to make any changes to the insulin regimen.  Will  continue with Lantus 15 units daily, Humalog 5 units t.i.d. a.c., and conservative sliding scale.    Will tailor his insulin regimen according to blood sugars in the hospital, please page if questions arise.    Marcheta Grammes, MD 03/24/2021, 14:46  Fellow, Endocrinology & Metabolism  [facility]  --  Late Entry   I saw and examined the patient.  I reviewed the fellow's note.  I agree with the findings and plan of care as documented in the fellow's note.  Any exceptions/additions are edited/noted.  Dr. Cleatrice Burke  Clinical Assistant Professor  Department of Endocrinology   03/25/2021

## 2021-03-24 NOTE — Consults (Signed)
[facility]  ACUTE PAIN SERVICE PROGRESS NOTE    DATE OF SERVICE:  03/24/2021  DIAGNOSIS:  Acute post-operative pain, s/p DISTIAL PANCREATECTOMY  SPLENECTOMY      BLOCK PERFORMED:    Bilateral  ESP catheters and duramorph spinal single shot   placed 03/23/21. Today is post-procedure day 2.    MEDICATION:  Lidocaine 0.4% at 2 ml/hr with 8 mL PIB    PLAN:   - Left ESP removed this morning. Orders removed.  - Oral medications per primary service.  - We will sign off at this time. Please call [phone] w/questions or concerns regarding [name]'s regional anesthesia management    Catheter Site Assessment:  Left  Clean, dry, intact; no surrounding tenderness, erythema, or induration    PE:   Neuro: alert and cooperative    SUBJECTIVE:  Patient reports excellent control of their pain (2/10). When the patient has pain it is located over his abdomen and describes the pain as soreness. He states it is the best he has felt in the past 2 days. [name] has no complaints related to their block. The patient denies headache, perioral numbness, metallic taste, and tinnitus.        VITALS:   Temperature: 36.4 C (97.6 F)  Heart Rate: (!) 106  BP (Non-Invasive): 136/78  Respiratory Rate: 16  SpO2: 91 %    Analgesics (last 24 hours)       Date/Time Action Medication Dose    03/23/21 0938 Given    HYDROmorphone (DILAUDID) 1 mg/mL injection 0.2 mg    03/23/21 0802 Given    oxyCODONE (ROXICODONE) immediate release tablet 5 mg    03/23/21 0801 Given    acetaminophen (TYLENOL) tablet 975 mg    03/23/21 0510 Given    HYDROmorphone (DILAUDID) 1 mg/mL injection 0.2 mg    03/23/21 0314 Given    oxyCODONE (ROXICODONE) immediate release tablet 5 mg    03/23/21 0017 Given    HYDROmorphone (DILAUDID) 1 mg/mL injection 0.2 mg    03/22/21 2102 Given    oxyCODONE (ROXICODONE) immediate release tablet 10 mg    03/22/21 2101 Given    acetaminophen (TYLENOL) tablet 975 mg    03/22/21 1816 Given    HYDROmorphone (DILAUDID) 1 mg/mL  injection 0.4 mg    03/22/21 1806 Given    HYDROmorphone (DILAUDID) 1 mg/mL injection 0.4 mg    03/22/21 1756 Given    HYDROmorphone (DILAUDID) 1 mg/mL injection 0.4 mg    03/22/21 1746 Given    HYDROmorphone (DILAUDID) 1 mg/mL injection 0.4 mg    03/22/21 1139 Given    acetaminophen (TYLENOL) tablet 975 mg            Anticoagulants/Antiplatelets (last 24 hours)       Date/Time Action Medication Dose    03/24/21 0604 Given    heparin 5,000 unit/mL injection 5,000 Units    03/23/21 2008 Given    heparin 5,000 unit/mL injection 5,000 Units    03/23/21 1326 Given    heparin 5,000 unit/mL injection 5,000 Units            Liver/Pancreas Enzyme Results Liver Function Results   No results found for this or any previous visit (from the past 30 hour(s)). No results found for this or any previous visit (from the past 30 hour(s)).          BMP Results Other Chemistries Results   Results for orders placed or performed during the hospital encounter of  03/22/21 (from the past 30 hour(s))   BASIC METABOLIC PANEL    Collection Time: 03/24/21  7:09 AM   Result Value    SODIUM 134 (L)    POTASSIUM 3.7    CHLORIDE 101    CO2 TOTAL 24    GLUCOSE 104    BUN 19    CREATININE 1.04    Recent Results (from the past 30 hour(s))   MAGNESIUM    Collection Time: 03/24/21  7:09 AM   Result Value    MAGNESIUM 2.1   PHOSPHORUS    Collection Time: 03/24/21  7:09 AM   Result Value    PHOSPHORUS 2.2 (L)             Evelena Peat, DO/  PGY-2 Anesthesiology  [facility]  Pager # [phone] - [facility]  03/24/21 10:55    I saw and examined the patient.  I reviewed the resident's or APP's note.  I agree with the findings and plan of care as documented in the resident's note.  Any exceptions/additions are edited/noted.    Thomas Hoff, MD  03/24/2021, 11:54

## 2021-03-24 NOTE — Progress Notes (Signed)
[facility]  Surgical Oncology Progress Note      [name], [name], 76 y.o. male  Date of Birth:  Jun 14, 1945  Date of Admission:  03/22/2021  Date of service: 03/24/2021    Assessment:  This is a 76 y.o. 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    Plan/Recommendations:  - Endocrine consulted   - Preop A1C  9.3  - HIV history   - Antiviral reordered   - ID called, stated as long as we have his medication on formulary and it is reordered, they do not need to follow  - Diet: Diabetic   - IVF: Discontinue  - Pain control: Tylenol, Oxycodone, ESP catheters, robaxin  - Last bowel movement: PTA, bowel regimen ordered  - Labs: Daily AM labs, drain amylase >6554 on POD1  - Nursing: OOB, IS  - Ppx   - GI: Home protonix       - DVT: Heparin, SCDs  - Lines/drains: JP drain  - Consults: Endocrinology  - Activity: OOB at least 6 hours daily  - PT/OT: Ordered  - Dispo: TBD    Subjective: NAEO. Tolerating regular diet, but does complain of some burping. Pain well controlled.  Objective:  Vitals:   Filed Vitals:    03/23/21 2117 03/23/21 2120 03/24/21 0018 03/24/21 0342   BP: 117/73  133/77 (!) 144/82   Pulse: 91  94 94   Resp: 17  16 16    Temp: 36.8 C (98.2 F)  36.8 C (98.2 F) 36.9 C (98.4 F)   SpO2: (!) 89% 92% 91% 91%     Constitutional: Pleasant, NAD  HEENT: Normocephalic, atraumatic. No scleral icterus.   Neck: Trachea midline.   Cardiovascular: RRR. No peripheral edema.  Respiratory: Normal effort, chest expands symmetrically.   GI: Abdomen soft and supple; non-tender, non-distended. Midline incision c/d/i with staples in place. JP drain with serosanguinous output.  Musculoskeletal: Moving all extremities.  Integumentary: Pink, warm, and dry.  Neurologic: Alert and oriented. No focal motor or sensory deficits  Psychiatric: Speech pattern and mood/affect normal.    Labs  Results for orders placed or performed during the  hospital encounter of 03/22/21 (from the past 24 hour(s))   AMYLASE BODY FLUID   Result Value Ref Range    AMYLASE BODY FLUID >6,554 No reference intervals are established. U/L   CBC/DIFF    Narrative    The following orders were created for panel order CBC/DIFF.  Procedure                               Abnormality         Status                     ---------                               -----------         ------                     CBC WITH RJJO[[id number]]  Please view results for these tests on the individual orders.   POC BLOOD GLUCOSE (RESULTS)   Result Value Ref Range    GLUCOSE, POC 137 (H) 70 - 105 mg/dl   POC BLOOD GLUCOSE (RESULTS)   Result Value Ref Range    GLUCOSE, POC 249 (H) 70 - 105 mg/dl   POC BLOOD GLUCOSE (RESULTS)   Result Value Ref Range    GLUCOSE, POC 192 (H) 70 - 105 mg/dl   POC BLOOD GLUCOSE (RESULTS)   Result Value Ref Range    GLUCOSE, POC 190 (H) 70 - 105 mg/dl       I/O:    Intake/Output Summary (Last 24 hours) at 03/24/2021 0034  Last data filed at 03/24/2021 0342  Gross per 24 hour   Intake 770 ml   Output 1517 ml   Net -747 ml        Current Medications:  Current Facility-Administered Medications   Medication Dose Route Frequency    acetaminophen (TYLENOL) tablet  975 mg Oral 4x/day    [Held by provider] amLODIPine (NORVASC) tablet  5 mg Oral NIGHTLY    atorvastatin (LIPITOR) tablet  40 mg Oral Daily    cetirizine (ZYRTEC) tablet  10 mg Oral Daily    D5W 250 mL flush bag   Intravenous Q15 Min PRN    electrolyte-A (PLASMALYTE-A) premix infusion   Intravenous Continuous    elvitegravir-cobicistat-emtricitrabine-tenofovir (GENVOYA) 150-150-200-10 mg per tablet  1 Tablet Oral Daily with Dinner    gabapentin (NEURONTIN) capsule  300 mg Oral 2x/day    heparin 5,000 unit/mL injection  5,000 Units Subcutaneous Q8HRS    HYDROmorphone (DILAUDID) 1 mg/mL injection  0.2 mg Intravenous Q4H PRN    insulin glargine-yfgn 100 units/mL  injection  15 Units Subcutaneous NIGHTLY    insulin lispro (HUMALOG) 100 units/mL injection  2 Units Subcutaneous 3x/day AC    levothyroxine (SYNTHROID) tablet  75 mcg Oral QAM    lidocaine PF (XYLOCAINE) 0.4% in NS 250 mL (tot vol) nerve plexus infusion w/ programmed intermittent boluses   Local Infiltration Continuous    magnesium oxide (MAG-OX) 400mg  (240mg  elemental magnesium) tablet  400 mg Oral 2x/day    methocarbamol (ROBAXIN) tablet  500 mg Oral 4x/day    NS flush syringe  2-6 mL Intracatheter Q8HRS    ondansetron (ZOFRAN) 2 mg/mL injection  4 mg Intravenous Q6H PRN    oxyCODONE (ROXICODONE) immediate release tablet  5 mg Oral Q4H PRN    Or    oxyCODONE (ROXICODONE) immediate release tablet  10 mg Oral Q4H PRN    pantoprazole (PROTONIX) delayed release tablet  40 mg Oral Daily    polyethylene glycol (MIRALAX) oral packet  17 g Oral 2x/day    sennosides-docusate sodium (SENOKOT-S) 8.6-50mg  per tablet  2 Tablet Oral 2x/day    simethicone (MYLICON) chewable tablet  80 mg Oral Q6H PRN    SSIP insulin lispro 100 units/mL injection  0-12 Units Subcutaneous 4x/day PRN       Pathology:  Pending    Cameron Ali, PA-C  03/24/2021, 06:28    I personally saw and examined the patient. See physician's assistant note for additional details.     Lynwood Dawley, MD

## 2021-03-24 NOTE — Progress Notes (Deleted)
[facility]  Surgical Oncology Progress Note      [name], [name], 76 y.o. male  Date of Birth:  [birth year]  Date of Admission:  03/22/2021  Date of service: 03/24/2021    Assessment:  This is a 76 y.o. 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    Plan/Recommendations:  - Endocrine consulted - following recs of 15 U Lantus daily and 3 U Humalog t.i.d with SSI  - HIV history - ID does not need to follow - continue genvoya  - Drain amylase 6554, continue to monitor JP output due to concern for pancreatic leak  - Diet: Diabetic  - IVF: discontinue fluids today  - Pain control: Tylenol, Oxycodone, robaxin - ESP catheter removed yesterday  - Nursing: OOB, IS  - Ppx   - GI: Home protonix       - DVT: Heparin, SCDs  - Lines/drains: JP drain  - Consults:  ID, endocrinology  - Activity: OOB at least 6 hours daily  - PT/OT: Ordered  - Dispo: Transfer to floor  - Care management: home vs home with home health    Subjective: NAEO. Tolerating regular diet. Encouraged OOB activity as much as possible.    Objective:  Vitals:   Filed Vitals:    03/23/21 2117 03/23/21 2120 03/24/21 0018 03/24/21 0342   BP: 117/73  133/77 (!) 144/82   Pulse: 91  94 94   Resp: 17  16 16    Temp: 36.8 C (98.2 F)  36.8 C (98.2 F) 36.9 C (98.4 F)   SpO2: (!) 89% 92% 91% 91%     Constitutional: Pleasant, NAD  HEENT: Normocephalic, atraumatic. No scleral icterus.   Neck: Trachea midline.   Cardiovascular: RRR. No peripheral edema.  Respiratory: Normal effort, chest expands symmetrically.   GI: Abdomen soft and supple; non-tender, non-distended. OR dressing in place over midline incision. JP drain with serosanguinous output.  Musculoskeletal: Moving all extremities.  Integumentary: Pink, warm, and dry.  Neurologic: Alert and oriented. No focal motor or sensory deficits  Psychiatric: Speech pattern and mood/affect normal.    Labs  Results for orders placed  or performed during the hospital encounter of 03/22/21 (from the past 24 hour(s))   AMYLASE BODY FLUID   Result Value Ref Range    AMYLASE BODY FLUID >6,554 No reference intervals are established. U/L   CBC/DIFF    Narrative    The following orders were created for panel order CBC/DIFF.  Procedure                               Abnormality         Status                     ---------                               -----------         ------                     CBC WITH EVOJ[[id number]]  Please view results for these tests on the individual orders.   POC BLOOD GLUCOSE (RESULTS)   Result Value Ref Range    GLUCOSE, POC 137 (H) 70 - 105 mg/dl   POC BLOOD GLUCOSE (RESULTS)   Result Value Ref Range    GLUCOSE, POC 249 (H) 70 - 105 mg/dl   POC BLOOD GLUCOSE (RESULTS)   Result Value Ref Range    GLUCOSE, POC 192 (H) 70 - 105 mg/dl   POC BLOOD GLUCOSE (RESULTS)   Result Value Ref Range    GLUCOSE, POC 190 (H) 70 - 105 mg/dl   POC BLOOD GLUCOSE (RESULTS)   Result Value Ref Range    GLUCOSE, POC 123 (H) 70 - 105 mg/dl       I/O:    Intake/Output Summary (Last 24 hours) at 03/24/2021 0646  Last data filed at 03/24/2021 0342  Gross per 24 hour   Intake 770 ml   Output 1517 ml   Net -747 ml        Current Medications:  Current Facility-Administered Medications   Medication Dose Route Frequency   . acetaminophen (TYLENOL) tablet  975 mg Oral 4x/day   . [Held by provider] amLODIPine (NORVASC) tablet  5 mg Oral NIGHTLY   . atorvastatin (LIPITOR) tablet  40 mg Oral Daily   . cetirizine (ZYRTEC) tablet  10 mg Oral Daily   . D5W 250 mL flush bag   Intravenous Q15 Min PRN   . electrolyte-A (PLASMALYTE-A) premix infusion   Intravenous Continuous   . elvitegravir-cobicistat-emtricitrabine-tenofovir (GENVOYA) 150-150-200-10 mg per tablet  1 Tablet Oral Daily with Dinner   . gabapentin (NEURONTIN) capsule  300 mg Oral 2x/day   . heparin 5,000 unit/mL injection  5,000 Units  Subcutaneous Q8HRS   . HYDROmorphone (DILAUDID) 1 mg/mL injection  0.2 mg Intravenous Q4H PRN   . insulin glargine-yfgn 100 units/mL injection  15 Units Subcutaneous NIGHTLY   . insulin lispro (HUMALOG) 100 units/mL injection  2 Units Subcutaneous 3x/day AC   . levothyroxine (SYNTHROID) tablet  75 mcg Oral QAM   . lidocaine PF (XYLOCAINE) 0.4% in NS 250 mL (tot vol) nerve plexus infusion w/ programmed intermittent boluses   Local Infiltration Continuous   . magnesium oxide (MAG-OX) 400mg  (240mg  elemental magnesium) tablet  400 mg Oral 2x/day   . methocarbamol (ROBAXIN) tablet  500 mg Oral 4x/day   . NS flush syringe  2-6 mL Intracatheter Q8HRS   . ondansetron (ZOFRAN) 2 mg/mL injection  4 mg Intravenous Q6H PRN   . oxyCODONE (ROXICODONE) immediate release tablet  5 mg Oral Q4H PRN    Or   . oxyCODONE (ROXICODONE) immediate release tablet  10 mg Oral Q4H PRN   . pantoprazole (PROTONIX) delayed release tablet  40 mg Oral Daily   . polyethylene glycol (MIRALAX) oral packet  17 g Oral 2x/day   . sennosides-docusate sodium (SENOKOT-S) 8.6-50mg  per tablet  2 Tablet Oral 2x/day   . simethicone (MYLICON) chewable tablet  80 mg Oral Q6H PRN   . SSIP insulin lispro 100 units/mL injection  0-12 Units Subcutaneous 4x/day PRN       Pathology:  Pending    Felicity Coyer, MED STUDENT, MS3, 03/24/2021, 06:26    The student note by Felicity Coyer) reflects his/her collection of patient data other than ROS, FH, and SH.  I was physically present during this data collection and have modified (if necessary) the note to reflect my own, personal collection/validation of the HPI, patient history, physical examination,  and assessment and plan.  Mariella Saa, MD

## 2021-03-25 ENCOUNTER — Other Ambulatory Visit: Payer: Self-pay

## 2021-03-25 ENCOUNTER — Other Ambulatory Visit (HOSPITAL_COMMUNITY): Payer: Self-pay | Admitting: Physician Assistant

## 2021-03-25 LAB — CBC WITH DIFF
BASOPHIL #: <0.1 10*3/uL (ref <=0.20)
BASOPHIL %: 0 %
EOSINOPHIL #: <0.1 10*3/uL (ref <=0.50)
EOSINOPHIL %: 0 %
HCT: 29 % — ABNORMAL LOW (ref 38.9–52.0)
HGB: 9.4 g/dL — ABNORMAL LOW (ref 13.4–17.5)
IMMATURE GRANULOCYTE #: 0.39 10*3/uL — ABNORMAL HIGH (ref <0.10)
IMMATURE GRANULOCYTE %: 3 % — ABNORMAL HIGH (ref 0–1)
LYMPHOCYTE #: 1.33 10*3/uL (ref 1.00–4.80)
LYMPHOCYTE %: 10 %
MCH: 30.8 pg (ref 26.0–32.0)
MCHC: 32.4 g/dL (ref 31.0–35.5)
MCV: 95.1 fL (ref 78.0–100.0)
MONOCYTE #: 0.94 10*3/uL (ref 0.20–1.10)
MONOCYTE %: 7 %
NEUTROPHIL #: 10.23 10*3/uL — ABNORMAL HIGH (ref 1.50–7.70)
NEUTROPHIL %: 80 %
PLATELETS: 133 10*3/uL — ABNORMAL LOW (ref 150–400)
RBC: 3.05 10*6/uL — ABNORMAL LOW (ref 4.50–6.10)
RDW-CV: 16.4 % — ABNORMAL HIGH (ref 11.5–15.5)
WBC: 12.9 10*3/uL — ABNORMAL HIGH (ref 3.7–11.0)

## 2021-03-25 LAB — BASIC METABOLIC PANEL
ANION GAP: 7 mmol/L (ref 4–13)
BUN/CREA RATIO: 17 (ref 6–22)
BUN: 23 mg/dL (ref 8–25)
CALCIUM: 8.8 mg/dL (ref 8.8–10.2)
CHLORIDE: 100 mmol/L (ref 96–111)
CO2 TOTAL: 25 mmol/L (ref 23–31)
CREATININE: 1.33 mg/dL (ref 0.75–1.35)
ESTIMATED GFR: 56 mL/min/BSA — ABNORMAL LOW (ref >=60)
GLUCOSE: 184 mg/dL — ABNORMAL HIGH (ref 65–125)
POTASSIUM: 4.3 mmol/L (ref 3.5–5.1)
SODIUM: 132 mmol/L — ABNORMAL LOW (ref 136–145)

## 2021-03-25 LAB — PHOSPHORUS: PHOSPHORUS: 3.3 mg/dL (ref 2.3–4.0)

## 2021-03-25 LAB — MAGNESIUM: MAGNESIUM: 2.2 mg/dL (ref 1.8–2.6)

## 2021-03-25 LAB — AMYLASE BODY FLUID: AMYLASE BODY FLUID: 2187 U/L

## 2021-03-25 LAB — POC BLOOD GLUCOSE (RESULTS)
GLUCOSE, POC: 129 mg/dl — ABNORMAL HIGH (ref 70–105)
GLUCOSE, POC: 211 mg/dl — ABNORMAL HIGH (ref 70–105)

## 2021-03-25 MED ORDER — NOVOLIN R REGULAR U-100 INSULIN 100 UNIT/ML INJECTION SOLUTION
INTRAMUSCULAR | 3 refills | Status: DC
Start: 2021-03-25 — End: 2021-03-25

## 2021-03-25 MED ORDER — FREESTYLE LIBRE 2 READER
1 refills | Status: DC
Start: 2021-03-25 — End: 2021-03-25
  Filled 2021-03-25: qty 1, fill #0

## 2021-03-25 MED ORDER — INSULIN SYRINGE-NEEDLE U-100 HALF UNIT MARKING 0.3 ML 31 GAUGE X 5/16"
INJECTION | 3 refills | Status: DC
Start: 2021-03-25 — End: 2021-05-28

## 2021-03-25 MED ORDER — NOVOLIN N NPH U-100 INSULIN ISOPHANE 100 UNIT/ML SUBCUTANEOUS SUSP
SUBCUTANEOUS | 2 refills | Status: DC
Start: 2021-03-25 — End: 2021-03-25

## 2021-03-25 MED ORDER — BLOOD SUGAR DIAGNOSTIC STRIPS
1.0000 | ORAL_STRIP | Freq: Four times a day (QID) | 3 refills | Status: DC
Start: 2021-03-25 — End: 2021-11-12

## 2021-03-25 MED ORDER — INSULIN GLARGINE (U-100) 100 UNIT/ML (3 ML) SUBCUTANEOUS PEN
PEN_INJECTOR | SUBCUTANEOUS | 12 refills | Status: DC
Start: 2021-03-25 — End: 2021-03-25
  Filled 2021-03-25: qty 3, fill #0

## 2021-03-25 MED ORDER — LANCETS 28 GAUGE
1.0000 | Freq: Four times a day (QID) | 3 refills | Status: DC
Start: 2021-03-25 — End: 2021-03-25
  Filled 2021-03-25: qty 200, 14d supply, fill #0

## 2021-03-25 MED ORDER — PEN NEEDLE, DIABETIC 32 GAUGE X 5/32"
11 refills | Status: DC
Start: 2021-03-25 — End: 2021-03-25

## 2021-03-25 MED ORDER — FREESTYLE LIBRE 2 SENSOR KIT
PACK | 3 refills | Status: DC
Start: 2021-03-25 — End: 2022-06-09
  Filled 2021-03-25: qty 2, 14d supply, fill #0

## 2021-03-25 MED ORDER — INSULIN LISPRO (U-100) 100 UNIT/ML SUBCUTANEOUS PEN
4.0000 [IU] | PEN_INJECTOR | Freq: Three times a day (TID) | SUBCUTANEOUS | 0 refills | Status: DC
Start: 2021-03-25 — End: 2021-03-25

## 2021-03-25 MED ORDER — NOVOLIN R REGULAR U-100 INSULIN 100 UNIT/ML INJECTION SOLUTION
INTRAMUSCULAR | 3 refills | Status: DC
Start: 2021-03-25 — End: 2021-03-31
  Filled 2021-03-25: qty 30, fill #0

## 2021-03-25 MED ORDER — INSULIN SYRINGE-NEEDLE U-100 HALF UNIT MARKING 0.3 ML 31 GAUGE X 5/16"
INJECTION | 3 refills | Status: DC
Start: 2021-03-25 — End: 2021-03-25
  Filled 2021-03-25: qty 100, 14d supply, fill #0

## 2021-03-25 MED ORDER — INSULIN ASPART (U-100) 100 UNIT/ML (3 ML) SUBCUTANEOUS PEN
4.0000 [IU] | PEN_INJECTOR | Freq: Three times a day (TID) | SUBCUTANEOUS | 0 refills | Status: DC
Start: 2021-03-25 — End: 2021-03-25

## 2021-03-25 MED ORDER — INSULIN GLARGINE (U-100) 100 UNIT/ML (3 ML) SUBCUTANEOUS PEN
15.0000 [IU] | PEN_INJECTOR | Freq: Every evening | SUBCUTANEOUS | 0 refills | Status: DC
Start: 2021-03-25 — End: 2021-03-25

## 2021-03-25 MED ORDER — OXYCODONE 5 MG TABLET
5.0000 mg | ORAL_TABLET | Freq: Four times a day (QID) | ORAL | 0 refills | Status: DC | PRN
Start: 2021-03-25 — End: 2021-04-02
  Filled 2021-03-25: qty 20, 5d supply, fill #0

## 2021-03-25 MED ORDER — INSULIN LISPRO (U-100) 100 UNIT/ML SUBCUTANEOUS PEN
PEN_INJECTOR | SUBCUTANEOUS | 12 refills | Status: DC
Start: 2021-03-25 — End: 2021-04-05
  Filled 2021-03-25: qty 15, 14d supply, fill #0

## 2021-03-25 MED ORDER — BLOOD SUGAR DIAGNOSTIC STRIPS
1.0000 | ORAL_STRIP | Freq: Four times a day (QID) | 3 refills | Status: DC
Start: 2021-03-25 — End: 2021-03-25
  Filled 2021-03-25: qty 150, 14d supply, fill #0

## 2021-03-25 MED ORDER — LANCETS 28 GAUGE
1.0000 | Freq: Four times a day (QID) | 3 refills | Status: DC
Start: 2021-03-25 — End: 2021-11-12

## 2021-03-25 MED ORDER — INSULIN GLARGINE (U-100) 100 UNIT/ML (3 ML) SUBCUTANEOUS PEN
PEN_INJECTOR | SUBCUTANEOUS | 12 refills | Status: DC
Start: 2021-03-25 — End: 2021-05-28
  Filled 2021-03-25: qty 15, 14d supply, fill #0

## 2021-03-25 MED ORDER — NOVOLIN R REGULAR U-100 INSULIN 100 UNIT/ML INJECTION SOLUTION
INTRAMUSCULAR | 3 refills | Status: DC
Start: 2021-03-25 — End: 2021-04-02
  Filled 2021-03-25: qty 10, 14d supply, fill #0

## 2021-03-25 MED ORDER — NOVOLIN N NPH U-100 INSULIN ISOPHANE 100 UNIT/ML SUBCUTANEOUS SUSP
SUBCUTANEOUS | 2 refills | Status: DC
Start: 2021-03-25 — End: 2021-03-31
  Filled 2021-03-25: qty 30, fill #0

## 2021-03-25 MED ORDER — PEN NEEDLE, DIABETIC 32 GAUGE X 5/32"
11 refills | Status: DC
Start: 2021-03-25 — End: 2021-05-28
  Filled 2021-03-25: qty 100, 14d supply, fill #0

## 2021-03-25 MED ORDER — LANCETS
1.0000 | Freq: Four times a day (QID) | 11 refills | Status: DC
Start: 2021-03-25 — End: 2021-03-25

## 2021-03-25 MED ORDER — FREESTYLE LIBRE 2 READER
1 refills | Status: DC
Start: 2021-03-25 — End: 2021-08-15
  Filled 2021-03-25: qty 1, 30d supply, fill #0

## 2021-03-25 MED ORDER — NOVOLIN N NPH U-100 INSULIN ISOPHANE 100 UNIT/ML SUBCUTANEOUS SUSP
SUBCUTANEOUS | 3 refills | Status: DC
Start: 2021-03-25 — End: 2021-03-31
  Filled 2021-03-25: qty 10, 14d supply, fill #0

## 2021-03-25 MED ORDER — FREESTYLE LIBRE 2 SENSOR KIT
PACK | 3 refills | Status: DC
Start: 2021-03-25 — End: 2021-03-25
  Filled 2021-03-25: qty 6, fill #0

## 2021-03-25 MED ORDER — BLOOD SUGAR DIAGNOSTIC STRIPS
1.0000 | ORAL_STRIP | Freq: Four times a day (QID) | 11 refills | Status: DC
Start: 2021-03-25 — End: 2021-03-25

## 2021-03-25 MED ORDER — NOVOLIN N NPH U-100 INSULIN ISOPHANE 100 UNIT/ML SUBCUTANEOUS SUSP
SUBCUTANEOUS | 3 refills | Status: DC
Start: 2021-03-25 — End: 2021-03-25
  Filled 2021-03-25: qty 10, 14d supply, fill #0

## 2021-03-25 NOTE — Consults (Addendum)
Diabetes Education Consult    Consulted to provide general diabetes education and insulin instruction.     Met with Philippa Sicks and his wife.     Patient states that he just found out he has diabetes. He had pancreatectomy on Monday.    Dontarius is a [facility] patient. Per care management note, new prescriptions need to be filled at [facility]. Typically new insulin prescriptions (Lantus, Novolog) have to be filled this way; discharge pharmacy can fill vials of NPH and Regular. Spoke with service about these issues.      Orders were sent to [facility]- denied.    Spoke with [facility] pharmacy- they state if orders are sent with "urgent" indication they may be able to expedite fills, which could be picked up in [place] or mailed to patient; however they state would likely need paperwork done and no guarantee this could all be accomplisted before 13:00.    Spoke with endocrinology about this- NPH and Regular vial orders to be sent to discharge pharmacy. 70/30 was discussed, but with patient having abdominal pain/nausea potentially, he wants to have separate insulins in case he needs to skip Regular dose due to poor intake.    Patient's wife, [name], shares that she uses Lantus [employer] cabin crew and [employer] 2 CGM. She states she has been through the process to get prior [place] through [place] provider. Treylan plans to have a visit with his PCP to hopefully transition to Lantus and Novolog, and get [place] sensors.    Demonstrated use of vial & syringe. Discussed how to mix N&R if desired.    Provided [name] with [employer] glucometer [employer] uses this brand as well) and a [employer] 2 sensor. They state they will start sensor at home, because they want to put an overlay patch on it. Reviewed target ranges. Reviewed hypoglycemia and Rule of 15.     Current A1c:    Lab Results   Component Value Date    HA1C 9.3 (H) 03/17/2021       Discussed A1c meaning, result, and goal.    Reviewed disease progression, ADA BG  targets and the importance of testing and avoidance of complications by managing blood sugars. Justine reports when he found out he had diabetes, he got rid of large quantities of sugary beverages and sweets. He enjoys golfing and was packing these high carb foods to take with him. [name] has had diabetes education and reports she eats a balanced diet; they are working together to [employer]' diet.    Patient shared that he follows with Dr. Drema Dallas at [facility].    Answered all questions. Education handouts provided. Verbalized understanding. Contact information provided.    Candy Sledge, MPH, RD, LD, [place]  Phone [phone] / SPOK    Addendum  Returned to review NPH and Regular doses, per endocrinology. [name] and Alice verbalized understanding.       Addendum  Per endocrinology, patient to be discharged with Lantus and Humalog as long as prescriptions have endo attending's signature. Later, received phone call from pharmacy. Lantus and Humalog are not able to be filled here with patient's insurance. [facility] will [facility] NPH and R at this time. Patient stated he will follow up with [place] provider to discuss transitioning to other insulins.

## 2021-03-25 NOTE — Consults (Signed)
[facility]  Department of Endocrinology  Diabetes Consult     Name/MRN: [name], [id number]  Age/DOB: 76 y.o., November 26, 1945  Date of service:03/25/2021    Reason for Consult:  Diabetes  Chief Complaint:  No chief complaint on file.    Subjective:  Patient seen and examined today.  Accompanied by wife in the room.  Getting ready to go home.  Denies any nausea, vomiting or diarrhea.  HPI:  [name] is a 76 y.o. 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 Endocrinology is consulted for management of type 2 diabetes mellitus.  Most recent HbA1c on admission was 9.2.    Per patient, he was never aware that he was diabetic and was recently diagnosed after his A1c was noted to be 9.2.    He is not taking any medications for management of his blood sugars.  He was noted to have a pancreatic tail mucinous cyst, for which he underwent open distal pancreatectomy and splenectomy on 20th February 2023.  He denies ever being diagnosed with diabetic ketoacidosis.  Has an extensive family history of diabetes.          ROS: All systems reviewed and negative except for as above.    Past Medical History:  Past Medical History:   Diagnosis Date    Cancer (CMS HCC)     prostate    Deep vein thrombosis (DVT) (CMS HCC)     right leg    Esophageal reflux     H/O hearing loss     High cholesterol     History of kidney disease     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    HTN (hypertension)     Human immunodeficiency virus (HIV) disease (CMS HCC)     Hyperlipidemia     "borderline"    Hypothyroidism     MRSA (methicillin resistant staph aureus) culture positive 01/02/2021    MRSA left groin abscess 01/03/21    MRSA (methicillin resistant staph aureus) culture positive 01/02/2021    MRSA blood 01/02/21    Thyroid disorder     Wears glasses        Past Surgical History:  Past  Surgical History:   Procedure Laterality Date    COLON SURGERY      per patient had 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 Darryl Nestle CERVICAL SPINE SURGERY  2001    HX COLOSTOMY REVERSAL      HX HERNIA REPAIR      KNEE SURGERY Bilateral     WRIST SURGERY Right        Home Medications:  Outpatient Medications Marked as Taking for the 03/22/21 encounter [facility] Encounter)   Medication Sig    Blood Sugar Diagnostic (ACCU-CHEK GUIDE TEST STRIPS) Strip 1 Strip Four times a day - before meals and bedtime    flash glucose scanning reader (FREESTYLE LIBRE 2 READER) Does not apply Misc Use as directed with FreeStyle Libre 2 sensors    flash glucose sensor (FREESTYLE LIBRE 2 SENSOR) Does not apply Kit To check blood glucose continuously. Change every 14 days    insulin glargine (LANTUS SOLOSTAR U-100 INSULIN) 100  unit/mL Subcutaneous Insulin Pen Inject 15 units under the skin daily    insulin lispro (HUMALOG KWIKPEN INSULIN) 100 unit/mL Subcutaneous Insulin Pen Inject 4 units with each meal. 1 box = 140 day supply    insulin NPH isoph U-100 human (NOVOLIN N NPH U-100 INSULIN) 100 unit/mL Subcutaneous Suspension Use 8 u in the morning and 7 u in the evening.    insulin NPH isoph U-100 human (NOVOLIN N NPH U-100 INSULIN) 100 unit/mL Subcutaneous Suspension Inject 8 units in the morning, and 7 units in the evening. 1 vial = 28 day supply    insulin regular human (NOVOLIN R REGULAR U-100 INSULN) 100 unit/mL Injection Solution Use  4 u in the morning before breakfast and 3 u in the evening before dinner.    insulin regular human (NOVOLIN R REGULAR U-100 INSULN) 100 unit/mL Injection Solution Use 4 u before breakfast and 3 u before dinner.    insulin syr/ndl U100 half mark 0.3 mL 31 gauge x 5/16" Syringe Use a new syringe for each injection.    lancets (FREESTYLE LANCETS) 28  gauge Misc 1 Stick Four times a day - before meals and bedtime    oxyCODONE (ROXICODONE) 5 mg Oral Tablet Take 1 Tablet (5 mg total) by mouth Every 6 hours as needed for Pain for up to 7 days    Pen Needle, Disposable, (BD NANO 2ND GEN PEN NEEDLE) 32 gauge x 5/32" Needle Use a new pen needle for each injection.    [DISCONTINUED] cholecalciferol, vitamin D3, 25 mcg (1,000 unit) Oral Tablet Take 1 Tablet (1,000 Units total) by mouth Once a day    [DISCONTINUED] DULoxetine (CYMBALTA DR) 20 mg Oral Capsule, Delayed Release(E.C.) Take 2 Capsules (40 mg total) by mouth Every night    [DISCONTINUED] omega-3-DHA-EPA-fish oil 1,000 mg (120 mg-180 mg) Oral Capsule Take by mouth Once a day       Inpatient Medications:    Current Facility-Administered Medications:     acetaminophen (TYLENOL) tablet, 975 mg, Oral, 4x/day, Bronikowski, Diane, MD, 975 mg at 03/25/21 0932    [Held by provider] amLODIPine (NORVASC) tablet, 5 mg, Oral, NIGHTLY, Bronikowski, Diane, MD    atorvastatin (LIPITOR) tablet, 40 mg, Oral, Daily, Bronikowski, Diane, MD, 40 mg at 03/24/21 2206    cetirizine (ZYRTEC) tablet, 10 mg, Oral, Daily, Bronikowski, Diane, MD, 10 mg at 03/25/21 0933    D5W 250 mL flush bag, , Intravenous, Q15 Min PRN, Loleta Rose, MD    elvitegravir-cobicistat-emtricitrabine-tenofovir (GENVOYA) 150-150-200-10 mg per tablet, 1 Tablet, Oral, Daily with Dinner, Kennon Rounds, Arby Barrette, PA-C, 1 Tablet at 03/24/21 1718    gabapentin (NEURONTIN) capsule, 300 mg, Oral, 2x/day, Bronikowski, Diane, MD, 300 mg at 03/25/21 0934    heparin 5,000 unit/mL injection, 5,000 Units, Subcutaneous, Q8HRS, Bronikowski, Diane, MD, 5,000 Units at 03/25/21 0645    insulin glargine-yfgn 100 units/mL injection, 15 Units, Subcutaneous, NIGHTLY, Madahar, Inderpreet, MD, 15 Units at 03/24/21 2208    insulin lispro (HUMALOG) 100 units/mL injection, 4 Units, Subcutaneous, 3x/day AC, Madahar, Inderpreet, MD, 4 Units at 03/25/21 1228    levothyroxine (SYNTHROID) tablet, 75  mcg, Oral, QAM, Bronikowski, Diane, MD, 75 mcg at 03/25/21 8016    methocarbamol (ROBAXIN) tablet, 500 mg, Oral, 4x/day, Pratt, Paige, PA-C, 500 mg at 03/25/21 0934    NS flush syringe, 2-6 mL, Intracatheter, Q8HRS, Loleta Rose, MD, 6 mL at 03/25/21 0627    ondansetron (ZOFRAN) 2 mg/mL injection, 4 mg, Intravenous, Q6H PRN, Eligah East, Diane, MD    oxyCODONE (ROXICODONE) immediate  release tablet, 5 mg, Oral, Q4H PRN, 5 mg at 03/23/21 0802 **OR** oxyCODONE (ROXICODONE) immediate release tablet, 10 mg, Oral, Q4H PRN, Eligah East, Diane, MD, 10 mg at 03/25/21 3329    pantoprazole (PROTONIX) delayed release tablet, 40 mg, Oral, Daily, Bronikowski, Diane, MD, 40 mg at 03/25/21 0644    polyethylene glycol (MIRALAX) oral packet, 17 g, Oral, 2x/day, Bronikowski, Diane, MD, 17 g at 03/25/21 5188    sennosides-docusate sodium (SENOKOT-S) 8.6-50mg per tablet, 2 Tablet, Oral, 2x/day, Kathee Delton, MD, 2 Tablet at 03/25/21 4166    simethicone (MYLICON) chewable tablet, 80 mg, Oral, Q6H PRN, Mariella Saa, MD, 80 mg at 03/23/21 0511    SSIP insulin lispro 100 units/mL injection, 0-12 Units, Subcutaneous, 4x/day PRN, 4 Units at 03/25/21 1228 **AND** POCT WHOLE BLOOD GLUCOSE, , , TID AC & HS, Bronikowski, Diane, MD     Allergies:  No Known Allergies     Family History:  Family Medical History:       Problem Relation (Age of Onset)    Cancer Mother, Father, Other    Heart Attack Mother    High Cholesterol Other    Stroke Other              Social History:  Social History     Tobacco Use    Smoking status: Never    Smokeless tobacco: Never   Vaping Use    Vaping Use: Never used   Substance Use Topics    Alcohol use: Not Currently    Drug use: Never       Physical Exam:  General: Well appearing male in no acute distress.  Psychiatric: Normal mood and affect  Neuro: Alert and oriented x 3. Speech fluent. Cranial nerves II-XII intact. No focal deficits noted.  HEENT: Head normocephalic, atraumatic. PERRLA, EOMI,  conjunctiva clear. Oral mucosa pink and moist.   Thyroid/Neck: Trachea midline  Lungs: Normal respiratory effort.   Cardiac: Regular rate and rhythm  Abdomen: Non-distended  Extremities: No edema noted bilaterally  Skin: No visible rashes.      Data Reviewed:  I have reviewed previous labs, tests, imaging, and notes.  Lab Results   Component Value Date    HA1C 9.3 (H) 03/17/2021     Recent Labs     03/23/21  1755 03/23/21  2109 03/24/21  0635 03/24/21  1219 03/24/21  1713 03/24/21  2051 03/24/21  2155 03/25/21  0635 03/25/21  1223   GLUIP 192* 190* 123* 193* 177* 169* 175* 129* 211*           Assessment/Plan:   ELEANOR DIMICHELE is a 75 y.o. 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 Endocrinology is consulted for management of type 2 diabetes mellitus.    Type 2 Diabetes Mellitus, uncontrolled, management complicated by severe hyperglycemia in hospital and subtotal pancreatectomy.  -Most recent HbA1c on admission  was 9.3.  -Home regimen:  None  -Current inpatient regimen:  Lantus 15 units, Humalog 4 units t.i.d. a.c., conservative sliding scale.  -Current diet: DIET DIABETIC Calorie amount: CC 2200   -Recommend   At this time, will not recommend to make any changes to the insulin regimen.  Will continue with Lantus 15 units daily, Humalog 4 units t.i.d. a.c., and conservative sliding scale.    Will recommend to discharge him on Lantus 15 daily and Humalog 4 units t.i.d. a.c. with a conservative  sliding scale. Insulin pens were called into the discharge pharmacy by our team. Discussed the plan of care with his wife as well. She is at the bedside.   Will recommend follow-up with endocrinology at [facility] in 2 weeks    Marcheta Grammes, MD 03/25/2021, 14:31  Fellow, Endocrinology & Metabolism  [facility]  ---    I saw and examined the patient.  I reviewed the fellow's  note.  I agree with the findings and plan of care as documented in the fellow's note.  Any exceptions/additions are edited/noted.    Dr. Cleatrice Burke  Clinical Assistant Professor  Department of Endocrinology

## 2021-03-25 NOTE — Nurses Notes (Addendum)
2155: Pt is alert and oriented x4. Pt denies SOB at this time.  Telemetry is not ordered, pulse is regular upon palpation.  Vital signs per flowsheet.  Respirations are even and unlabored.  LLQ JP drain intact and patent. Mid abdominal incision OTA, bruised and tender.  LBM 2/20, pt reports passing flatus.  PIV capped.  Pt in good spirits, ambulates with a strong and steady gait. Pt hard of hearing, hearing aides in place.  Pt denies any needs at this time. Pt instructed to call for assistance, pt verbalized understanding. Call bell is within reach, bed is locked in the lowest position. Complete assessment per doc flowsheet. Will continue to monitor pt status.     2051: Pt c/o 7/10 abdominal pain. LPN administered Oxycodone 10 mg PO given per PRN order.      2207: FS 175, LPN administered 2 units Lispro insulin SQ per PRN order.     2220: Pt c/o 7/10 abdominal pain.  Dilaudid 0.2mg  IV Push given per PRN order.     0102: Pt c/o 8/10 incisional pain.  Oxycodone 10 mg PO given per PRN order.  Pt resting in bed with call bell within reach. Full assessment completed and unchanged from prior assessment.  Vital signs remain stable, per flowsheet.

## 2021-03-25 NOTE — Progress Notes (Signed)
[facility]  Surgical Oncology Progress Note      [name], [name], 76 y.o. male  Date of Birth:  January 14, 1946  Date of Admission:  03/22/2021  Date of service: 03/25/2021    Assessment:  This is a 76 y.o. 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 Tolerating regular diet.    Objective:  Vitals:   Filed Vitals:    03/24/21 1725 03/24/21 2151 03/24/21 2307 03/25/21 0015   BP: 129/75   128/75   Pulse: 100   99   Resp: 15 16 16 16    Temp:    36.8 C (98.2 F)   SpO2: 91%   94%     Constitutional: Pleasant, NAD  HEENT: Normocephalic, atraumatic. No scleral icterus.   Neck: Trachea midline.   Cardiovascular: RRR. No peripheral edema.  Respiratory: Normal effort, chest expands symmetrically.   GI: Abdomen soft and supple; non-tender, non-distended. OR dressing in place over midline incision. JP drain with serosanguinous output.  Musculoskeletal: Moving all extremities.  Integumentary: Pink, warm, and dry.  Neurologic: Alert and oriented. No focal motor or sensory deficits  Psychiatric: Speech pattern and mood/affect normal.    Labs  Results for orders placed or performed during the hospital encounter of 03/22/21 (from the past 24 hour(s))   PHOSPHORUS   Result Value Ref Range    PHOSPHORUS 2.2 (L) 2.3 - 4.0 mg/dL    Narrative    Hemolysis can  alter results at this level (slight).   MAGNESIUM   Result Value Ref Range    MAGNESIUM 2.1 1.8 - 2.6 mg/dL    Narrative    Hemolysis can alter results at this level (slight).   CBC/DIFF    Narrative    The following orders were created for panel order CBC/DIFF.  Procedure                               Abnormality         Status                     ---------                               -----------         ------                     CBC WITH GSUP[[id number]]                Abnormal  Final result                 Please view results for these tests on the individual orders.   BASIC METABOLIC PANEL   Result Value Ref Range    SODIUM 134 (L) 136 - 145 mmol/L    POTASSIUM 3.7 3.5 - 5.1 mmol/L    CHLORIDE 101 96 - 111 mmol/L    CO2 TOTAL 24 23 - 31 mmol/L    ANION GAP 9 4 - 13 mmol/L    CALCIUM 9.1 8.8 - 10.2 mg/dL    GLUCOSE 104 65 - 125 mg/dL    BUN 19 8 - 25 mg/dL    CREATININE 1.04 0.75 - 1.35 mg/dL    BUN/CREA RATIO 18 6 - 22    ESTIMATED GFR 75 >=60 mL/min/BSA    Narrative    Hemolysis can alter results at this level (slight).   CBC WITH DIFF   Result Value Ref Range    WBC 13.2 (H) 3.7 - 11.0 x10^3/uL    RBC 3.64 (L) 4.50 - 6.10 x10^6/uL    HGB 11.2 (L) 13.4 - 17.5 g/dL    HCT 33.6 (L) 38.9 - 52.0 %    MCV 92.3 78.0 - 100.0 fL    MCH 30.8 26.0 - 32.0 pg    MCHC 33.3 31.0 - 35.5 g/dL    RDW-CV 16.3 (H) 11.5 - 15.5 %    PLATELETS 119 (L) 150 - 400 x10^3/uL    MPV 11.3 8.7 - 12.5 fL    NEUTROPHIL % 80 %    LYMPHOCYTE % 13 %    MONOCYTE % 5 %    EOSINOPHIL % 0 %    BASOPHIL % 0 %    NEUTROPHIL # 10.52 (H) 1.50 - 7.70 x10^3/uL    LYMPHOCYTE # 1.69 1.00 - 4.80 x10^3/uL    MONOCYTE # 0.66 0.20 - 1.10 x10^3/uL    EOSINOPHIL # <0.10 <=0.50 x10^3/uL    BASOPHIL # <0.10 <=0.20 x10^3/uL    IMMATURE GRANULOCYTE % 2 (H) 0 - 1 %    IMMATURE GRANULOCYTE # 0.26 (H) <0.10 x10^3/uL   AMYLASE BODY FLUID   Result Value Ref Range    AMYLASE BODY FLUID 2,187 No reference intervals are established. U/L   CBC/DIFF    Narrative     The following orders were created for panel order CBC/DIFF.  Procedure                               Abnormality         Status                     ---------                               -----------         ------                     CBC WITH NUUV[[id number]]                Abnormal            Final result                 Please view results for these tests on the individual orders.   BASIC METABOLIC PANEL   Result Value  Ref Range    SODIUM 132 (L) 136 - 145 mmol/L    POTASSIUM 4.3 3.5 - 5.1 mmol/L    CHLORIDE 100 96 - 111 mmol/L    CO2 TOTAL 25 23 - 31 mmol/L    ANION GAP 7 4 - 13 mmol/L    CALCIUM 8.8 8.8 - 10.2 mg/dL    GLUCOSE 184 (H) 65 - 125 mg/dL    BUN 23 8 - 25 mg/dL    CREATININE 1.33 0.75 - 1.35 mg/dL    BUN/CREA RATIO 17 6 - 22    ESTIMATED GFR 56 (L) >=60 mL/min/BSA   PHOSPHORUS   Result Value Ref Range    PHOSPHORUS 3.3 2.3 - 4.0 mg/dL   MAGNESIUM   Result Value Ref Range    MAGNESIUM 2.2 1.8 - 2.6 mg/dL   CBC WITH DIFF   Result Value Ref Range    WBC 12.9 (H) 3.7 - 11.0 x10^3/uL    RBC 3.05 (L) 4.50 - 6.10 x10^6/uL    HGB 9.4 (L) 13.4 - 17.5 g/dL    HCT 29.0 (L) 38.9 - 52.0 %    MCV 95.1 78.0 - 100.0 fL    MCH 30.8 26.0 - 32.0 pg    MCHC 32.4 31.0 - 35.5 g/dL    RDW-CV 16.4 (H) 11.5 - 15.5 %    PLATELETS 133 (L) 150 - 400 x10^3/uL    NEUTROPHIL % 80 %    LYMPHOCYTE % 10 %    MONOCYTE % 7 %    EOSINOPHIL % 0 %    BASOPHIL % 0 %    NEUTROPHIL # 10.23 (H) 1.50 - 7.70 x10^3/uL    LYMPHOCYTE # 1.33 1.00 - 4.80 x10^3/uL    MONOCYTE # 0.94 0.20 - 1.10 x10^3/uL    EOSINOPHIL # <0.10 <=0.50 x10^3/uL    BASOPHIL # <0.10 <=0.20 x10^3/uL    IMMATURE GRANULOCYTE % 3 (H) 0 - 1 %    IMMATURE GRANULOCYTE # 0.39 (H) <0.10 x10^3/uL    Narrative    Mean Platelet Volume - Not Reported  Platelet Count -Results reviewed.   POC BLOOD GLUCOSE (RESULTS)   Result Value Ref Range    GLUCOSE, POC 123 (H) 70 - 105 mg/dl   POC BLOOD GLUCOSE (RESULTS)   Result Value Ref Range    GLUCOSE, POC 193 (H) 70 - 105 mg/dl   POC BLOOD  GLUCOSE (RESULTS)   Result Value Ref Range    GLUCOSE, POC 177 (H) 70 - 105 mg/dl   POC BLOOD GLUCOSE (RESULTS)   Result Value Ref Range    GLUCOSE, POC 169 (H) 70 - 105 mg/dl   POC BLOOD GLUCOSE (RESULTS)   Result Value Ref Range    GLUCOSE, POC 175 (H) 70 - 105 mg/dl       I/O:    Intake/Output Summary (Last 24 hours) at 03/25/2021 0625  Last data filed at 03/25/2021 0345  Gross per 24 hour   Intake 510 ml   Output 327.5 ml   Net 182.5 ml        Current Medications:  Current Facility-Administered Medications   Medication Dose Route Frequency   . acetaminophen (TYLENOL) tablet  975 mg Oral 4x/day   . [Held by provider] amLODIPine (NORVASC) tablet  5 mg Oral NIGHTLY   . atorvastatin (LIPITOR) tablet  40 mg Oral Daily   . cetirizine (ZYRTEC) tablet  10 mg Oral Daily   . D5W 250 mL flush bag   Intravenous Q15  Min PRN   . elvitegravir-cobicistat-emtricitrabine-tenofovir (GENVOYA) 150-150-200-10 mg per tablet  1 Tablet Oral Daily with Dinner   . gabapentin (NEURONTIN) capsule  300 mg Oral 2x/day   . heparin 5,000 unit/mL injection  5,000 Units Subcutaneous Q8HRS   . HYDROmorphone (DILAUDID) 1 mg/mL injection  0.2 mg Intravenous Q4H PRN   . insulin glargine-yfgn 100 units/mL injection  15 Units Subcutaneous NIGHTLY   . insulin lispro (HUMALOG) 100 units/mL injection  4 Units Subcutaneous 3x/day AC   . levothyroxine (SYNTHROID) tablet  75 mcg Oral QAM   . magnesium oxide (MAG-OX) 400mg  (240mg  elemental magnesium) tablet  400 mg Oral 2x/day   . methocarbamol (ROBAXIN) tablet  500 mg Oral 4x/day   . NS flush syringe  2-6 mL Intracatheter Q8HRS   . ondansetron (ZOFRAN) 2 mg/mL injection  4 mg Intravenous Q6H PRN   . oxyCODONE (ROXICODONE) immediate release tablet  5 mg Oral Q4H PRN    Or   . oxyCODONE (ROXICODONE) immediate release tablet  10 mg Oral Q4H PRN   . pantoprazole (PROTONIX) delayed release tablet  40 mg Oral Daily   . polyethylene glycol (MIRALAX) oral packet  17 g Oral 2x/day   . sennosides-docusate sodium  (SENOKOT-S) 8.6-50mg  per tablet  2 Tablet Oral 2x/day   . simethicone (MYLICON) chewable tablet  80 mg Oral Q6H PRN   . SSIP insulin lispro 100 units/mL injection  0-12 Units Subcutaneous 4x/day PRN       Pathology:  Pending    Allene Pyo, PA-C  03/25/2021, 06:30

## 2021-03-25 NOTE — Care Management Notes (Signed)
[facility]  Care Management Note    Patient Name: [name]  Date of Birth: 11-02-1945  Sex: male  Date/Time of Admission: 03/22/2021 10:33 AM  Room/Bed: 970/A  Payor: MEDICARE / Plan: MEDICARE PART A AND B / Product Type: Medicare /    LOS: 3 days   Primary Care Providers:  [place] (General)    Admitting Diagnosis:  IPMN (intraductal papillary mucinous neoplasm) [D49.0]    Assessment:      03/25/21 1340   Assessment Details   Assessment Type Continued Assessment   Date of Care Management Update 03/25/21   Medicare Intent to Discharge Documentation   Discharge IMM give to: Patient   Discharge IMM Letter Given Date 03/25/21   Discharge IMM Letter Given Time 1337   IMM explained/reviewed with:  Patient;verbalized understanding   Care Management Plan   Discharge Planning Status plan in progress   Projected Discharge Date 03/25/21   Discharge plan discussed with: Patient   Discharge Needs Assessment   Discharge Facility/Level of Care Needs Home (Patient/Family Member/other)(code 1)         Discharge Plan:  Home (Patient/Family Member/other) (code 1)    Per service, patient medically cleared for d/c.  PT/OT d/c disposition recommendation home with assist.  [facility] met with patient to discuss d/c disposition, per patient, no needs at d/c, [facility] asked patient if he had a need for [place], patient declined, stating wife will assist at home.  Spouse present for converstation, spouse to provide transport at d/c.  [facility] provided patient with copy of IMM, reviewed IMM, patient verbalized understanding, signed IMM.  Per patient VA primary insurance and Medicare secondary.  D/C disposition home no needs.  D/C complete.    The patient will continue to be evaluated for developing discharge needs.     Case Manager: Kerry Dory, [place]  Phone: [phone]

## 2021-03-25 NOTE — Discharge Summary (Signed)
[facility]  DISCHARGE SUMMARY    PATIENT NAME:  [name], [name]  MRN:  [id number]  DOB:  Jul 27, 1945    ENCOUNTER DATE:  03/22/2021  INPATIENT ADMISSION DATE: 03/22/2021  DISCHARGE DATE:  03/25/2021    ATTENDING PHYSICIAN: Nicola Police, MD  SERVICE: SURG ONCOLOGY  PRIMARY CARE PHYSICIAN: [place]     LAY CAREGIVER:  ,  ,        PRIMARY DISCHARGE DIAGNOSIS: IPMN (intraductal papillary mucinous neoplasm)  Active Hospital Problems    Diagnosis Date Noted   . Principal Problem: IPMN (intraductal papillary mucinous neoplasm) [D49.0] 03/22/2021      Resolved Hospital Problems   No resolved problems to display.     Active Non-Hospital Problems    Diagnosis Date Noted   . Cellulitis 01/04/2021   . Abscess of left groin 01/03/2021   . Gram-positive cocci bacteremia 01/03/2021   . Sepsis due to cellulitis (CMS [place]) 01/02/2021   . Lactic acidosis 01/02/2021   . Rash 01/02/2021   . Candida infection of genital region 11/03/2020   . Chronic renal failure syndrome 11/03/2020   . Postherpetic neuralgia 11/03/2020   . Hyperlipidemia 11/03/2020   . Thyroid disorder 11/03/2020   . Sprain of knee 11/03/2020   . Seborrhea 11/03/2020   . Positive laboratory testing for human immunodeficiency virus (CMS [place]) 11/03/2020   . 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 Actinic keratosis 11/03/2020   . Multiple actinic keratoses 11/03/2020   . Malignant neoplasm of prostate (CMS [place]) 11/03/2020   . Incisional hernia 11/03/2020   . Fall 11/03/2020   . Diverticulitis of colon 11/03/2020   . 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[place]) 04/19/2020   . Acute cystitis without hematuria 04/19/2020   . Chest pain 03/24/2020   . Near syncope 03/24/2020   . Acute kidney injury (CMS [place]) 03/24/2020   . 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 GERD without esophagitis 03/24/2020   . Essential hypertension 03/24/2020   . Acquired hypothyroidism 03/24/2020   . Mixed hyperlipidemia 03/24/2020   . HIV  infection (CMS [place]) 03/24/2020   . Generalized anxiety disorder 03/13/2020   . Cervicalgia 02/06/2020   . Low back pain 02/06/2020   . Pain in left knee 11/29/2019   . Inflammation of sacroiliac joint (CMS [place]) 02/15/2019   . Acute respiratory distress syndrome (ARDS) (CMS HCC) 01/17/2019   . Depressive disorder 01/17/2019   . Diverticulitis 01/17/2019   . Herpes zoster 01/17/2019   . Asymptomatic HIV infection (CMS [place]) 01/17/2019   . Risk of exposure to communicable disease 08/22/2018   . Nausea 08/22/2018        DISCHARGE MEDICATIONS:     Current Discharge Medication List      START taking these medications.      Details   Blood Sugar Diagnostic Strip  Commonly known as: Accu-Chek Guide test strips   1 Strip, Does not apply, 4 TIMES DAILY - BEFORE MEALS AND NIGHTLY  Qty: 150 Each  Refills: 3     insulin syr/ndl U100 half mark 0.3 mL 31 gauge x 5/16" Syringe   Use a new syringe for each injection.  Qty: 150 Each  Refills: 3     lancets 28 gauge Misc  Commonly known as: FreeStyle Lancets   1 Stick, Does not apply, 4 TIMES DAILY - BEFORE MEALS AND NIGHTLY  Qty: 150 Each  Refills: 3     NovoLIN N  NPH U-100 Insulin 100 unit/mL Suspension  Generic drug: insulin NPH isoph U-100 human   Use 8 u in the morning and 7 u in the evening.  Qty: 30 mL  Refills: 2     NovoLIN R Regular U-100 Insuln 100 unit/mL Solution  Generic drug: insulin regular human   Use  4 u in the morning before breakfast and 3 u in the evening before dinner.  Qty: 30 mL  Refills: 3     oxyCODONE 5 mg Tablet  Commonly known as: ROXICODONE   5 mg, Oral, EVERY 6 HOURS PRN  Qty: 20 Tablet  Refills: 0     Pen Needle (Disposable) 32 gauge x 5/32" Needle  Commonly known as: BD Nano 2nd Gen Pen Needle   Use a new pen needle for each injection.  Qty: 150 Each  Refills: 11        CONTINUE these medications - NO CHANGES were made during your visit.      Details   amLODIPine 5 mg Tablet  Commonly known as: NORVASC   5 mg, Oral, DAILY  Refills: 0     atorvastatin  80 mg Tablet  Commonly known as: LIPITOR   40 mg, Oral, DAILY, Take one half pill every morning with breakfast (40 mg)  Refills: 0     BIOTIN ORAL   Oral  Refills: 0     cetirizine 10 mg Tablet  Commonly known as: ZYRTEC   10 mg, Oral, DAILY  Refills: 0     gabapentin 300 mg Capsule  Commonly known as: NEURONTIN   1 Tablet, Oral, 2 TIMES DAILY  Refills: 0     Genvoya 150-150-200-10 mg Tablet  Generic drug: elviteg-cob-emtri-tenof ALAFEN   1 Tablet, DAILY, With the evening meal  Refills: 0     levothyroxine 88 mcg Tablet  Commonly known as: SYNTHROID   75 mcg, Oral, EVERY MORNING  Refills: 0     melatonin 10 mg Capsule   Oral, NIGHTLY  Refills: 0     MULTIVITAMIN ORAL   Oral  Refills: 0     pantoprazole 40 mg Tablet, Delayed Release (E.C.)  Commonly known as: PROTONIX   40 mg, Oral, 2 TIMES DAILY, 30 minutes before a meal  Refills: 0     POTASSIUM ORAL   Oral  Refills: 0          Discharge med list refreshed?  YES                     ALLERGIES:  No Known Allergies          HOSPITAL PROCEDURE(S):   Bedside Procedures:  No orders of the defined types were placed in this encounter.    Surgical   Surgical/Procedural Cases on this Admission     Case IDs Date Procedure Surgeon Location Status    [id number] 03/22/21 DISTIAL PANCREATECTOMYSPLENECTOMY Nicola Police, MD [facility] [place] [facility]     BRIEF HPI:  This is a 76 y.o., male with PMH of prostate cancer s/p radiation (2017), HIV controlled with Genvoya, minimal viral load and CD 4 count > 500, s/p colectomy for diverticular perforation (2006), and recent hospital admission for septic arthrtis of the hip and DVT. 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     BRIEF  HOSPITAL NARRATIVE: He underwent surgery on 03/22/21. The immediate post-operative course was without complications. He had bilateral ESP catheters placed on 03/22/21. His drain amylase decreased from 6554 to 2187 on 03/25/21. His pain was well controlled with his current regimen and his diabetes was managed inpatient by endocrine with Lantus 15u, Humalog 5u tid ac, and conservative sliding scale. He was able to tolerate a diabetic diet without nausea or vomiting. He was able to ambulate without difficulty. He was deemed suitable for discharge on 03/25/21 and will follow up in clinic in 1 week.      TRANSITION/POST DISCHARGE CARE/PENDING TESTS/REFERRALS: None    CONDITION ON DISCHARGE:  A. Ambulation: Full ambulation  B. Self-care Ability: Complete  C. Cognitive Status Alert and Oriented x 3  D. Code status at discharge: Full      LINES/DRAINS/WOUNDS AT DISCHARGE:   Patient Lines/Drains/Airways Status     Active Line / Dialysis Catheter / Dialysis Graft / Drain / Airway / Wound     Name Placement date Placement time Site Days    Peripheral IV Posterior;Right Wrist 03/22/21  1130  -- 3    Terrial Rhodes Drain 1 Left;Medial;Upper Abdomen 03/22/21  2482  -- 3    Wound (Non-Surgical) Anterior;Left Hip 01/04/21  --  -- 80    Surgical Incision Mid Abdomen 03/22/21  --  -- 3                DISCHARGE DISPOSITION:  Home discharge      DISCHARGE INSTRUCTIONS:  Post-Discharge Follow Up Appointments     Wednesday Apr 14, 2021    Return Patient Visit with Nicola Police, MD at 10:20 AM    Return Patient Visit with Shannan Harper, MD at 11:00 AM    Thursday May 06, 2021    New Patient Visit with Julius Bowels, APRN,NP-C at 10:00 AM    Endocrinology, [facility], [place] [address]  [phone] Gastroenterology/Hepatology, [place], [place]  [address]  [phone] Oncology Surgery, [place], [place] [address]  [phone]           DISCHARGE INSTRUCTION - DIABETIC DIET    As instructed while in hospital.     Diet: DIABETIC DIET      DISCHARGE INSTRUCTION - INCISION/WOUND CARE    POST-OPERATIVE WOUND CARE   From the day of placement, the stiches/staples on the surgical wound will be removed in 10-14 days. You will have an appointment with your surgeon for the removal.   [place] the wounds at least once a day with warm, soapy water. You may do this easily in the shower. No swimming or submerging the wound, like in a pool, bathtub or hot tub.  Pat the wound dry. DO NOT RUB!  Please refer to your diet and activity instructions. You are on these restrictions until your clinic appointment(s) and evaluation of your healing.  SEEK MEDICAL ATTENTION IF:  There is redness, swelling or increased pain in the wound that is not controlled with pain medications as prescribed.  There is drainage, blood or pus coming from the wound lasting longer than 1 day, or sooner if  there is a concern.  You develop signs of a generalized infection including muscle aches, chills, fever or a general ill feeling.  You notice a foul smell coming from the wound or dressing.  You developed persistent nausea or vomiting.     Instructions for incision/wound care: z - other (specify in comments)      [place] INSTRUCTIONS    Please provide the patient with the appropriate drain care instructions, including handouts via [place].     DISCHARGE INSTRUCTION - HOME DRAIN ([place]) [place] ([place])   Your surgeon has placed a drain in your surgical wound to allow for the removal of excess fluid from the site. The color and the amount of fluid will vary. Immediately after surgery the fluid is bright red and it will  gradually dilute to an amber then yellow/straw-color. The tubing may sometimes contain tan or red streaks. Keep the drain compressed at all times except with emptying it. The compression creates suction and is needed to remove the fluid. The drain site is sutured in place and will not slip out.  DAILY CARE  Wash your hands with soap and water before emptying the drain. Strip the tubing on the drain and empty the drain 2-3 times per day, as instructed by your nurse or doctor. Hold the drain upright with the plug at the top; remove the plug from the spout. Empty the amount of fluid into a measuring cup (provided by your nurse on discharge), and record output in a drainage record. Pour the drainage into the toilet. Flatten the drain so that the sides of the drain touch, and replace the plug into the spout. Store the drain in a side pocket or use a safety pin to attach to your clothing via the plastic loop on the drain. Keep a record of the drainage amount and when it is less than 30cc (ml) in a 24 hour period, call [phone] to schedule an office visit to have the drain removed.  Change the dressing at the site at least once a day. Wash your hands with warm soap and water. Remove the previous dressing and note if there is any drainage or smell noticed. Clean the site with warm soap and water and pat dry. DO NOT RUB! Cover the site with the split gauze and up and around the drain site and secure with tape.  You may shower with the drain in place. Otherwise, keep the drain site where the tube enters the skin dry and covered with a dressing as directed.  SEEK MEDICAL ATTENTION IF:  There is redness, swelling or increased pain at the drain site that is not controlled with pain medications as prescribed.  There is drainage, blood or pus coming from the drain site lasting longer than 1 day, or sooner if there is a concern.  You develop signs of a generalized infection including muscle aches, chills, fever or a general ill  feeling.  You notice a foul smell coming from the wound or dressing.  You developed persistent nausea or vomiting.     DISCHARGE INSTRUCTION - SIGNS AND SYMPTOMS OF INFECTION    SIGNS AND SYMPTOMS OF INFECTION    Please watch your incision/wound site for the following signs of potential infection:    Increased redness or warmth at the incision site.  Drainage from the wound that may be foul smelling, cloudy, yellow or green  in color.  Bulging or increased swelling at the incision site.  A temperature of more than 101.5 degrees F by mouth for 2 readings taken 4 hours apart.  A sudden increase in pain at the wound that is not relieved with pain medication.     DISCHARGE INSTRUCTION - POST-SURGICAL PAIN    Post surgical pain is a complex response to tissue trauma during surgery that stimulates hypersensitivity of the nervous system. Post-operative pain can be felt after any surgical procedure. Post-operative pain increases the possibility of post-surgical complications, raises the cost of medical care, and most importantly, interferes with recovery and return to normal activities of daily living. Management of post-surgical pain is a basic patient right.    Recent studies on pain control are indicating that taking a non-steroidal anti-inflammatory drug such as ibuprofen (Advil, Motrin) together with acetaminophen (Tylenol) has more significant post-operative pain relief than taking either drug alone.  Also, the ibuprofen and acetaminophen combination has significantly more pain relief than narcotic medications such as codeine, hydrocodone (Vicodin, Norco, Lortab), and oxycodone (Percocet, Percodan).    For mild to moderate discomfort following surgery, 400 mg of ibuprofen (2 tablets of Advil or Motrin) every 4 hours as needed or 600 mg of ibuprofen (3 tablets of Advil or Motrin) every 6 hours as needed is usually adequate.  If you are unable to take ibuprofen, then 500 - 1000 mg of acetaminophen (1 or 2  tablets of Extra Strength Tylenol) every 6 hours as needed can be taken for mild to moderate discomfort.  If you are experiencing moderate to severe pain, we suggest that you take 600 mg of ibuprofen (3 tablets of Advil or Motrin) and 1000 mg of acetaminophen (2 tablets of Extra Strength Tylenol) at the same time every 6 hours as needed.  If this does not give you adequate pain relief, please contact us for advice.    Do Not Take Tylenol or acetaminophen if you are already taking a narcotic drug that contains acetaminophen     SCHEDULE FOLLOW-UP - [facility] - SURGICAL ONCOLOGY     Follow-up in: [place]    Reason for visit: HOSPITAL DISCHARGE    Follow-up reason: Post-op whipple with Dr. Cyndi Bender      DME - ABDOMINAL BINDER    Provide abdominal binder to patient for comfort as needed.     Ht 175.3 cm    Wt 76.3 kg    Current Attending: Dr. Cyndi Bender    Medical Condition or Diagnosis which is primary reason for equipment: Post-op Whipple    Start Date 03/25/2021    Freedom of Choice: I have informed patient of their freedom of choice with respect to DME providers                 Mariella Saa, MD    Copies sent to Care Team       Relationship Specialty Notifications [place] PCP - General EXTERNAL  03/19/18     Phone: [phone] Fax: [phone]         [address] [place] [address]            Referring providers can utilize [email or web address] to access their referred [place] patient's information.

## 2021-03-26 ENCOUNTER — Other Ambulatory Visit: Payer: Self-pay

## 2021-03-26 DIAGNOSIS — C258 Malignant neoplasm of overlapping sites of pancreas: Secondary | ICD-10-CM

## 2021-03-26 LAB — SURGICAL PATHOLOGY SPECIMEN

## 2021-03-27 ENCOUNTER — Encounter (INDEPENDENT_AMBULATORY_CARE_PROVIDER_SITE_OTHER): Payer: Self-pay

## 2021-03-30 ENCOUNTER — Encounter (HOSPITAL_COMMUNITY): Payer: Self-pay

## 2021-03-30 ENCOUNTER — Inpatient Hospital Stay
Admission: EM | Admit: 2021-03-30 | Discharge: 2021-04-02 | DRG: 176 | Disposition: A | Discharge status: Rehabilitation Facility | Payer: 59 | Attending: SURGICAL ONCOLOGY | Admitting: SURGICAL ONCOLOGY

## 2021-03-30 ENCOUNTER — Inpatient Hospital Stay (HOSPITAL_COMMUNITY): Payer: 59 | Admitting: SURGICAL ONCOLOGY

## 2021-03-30 ENCOUNTER — Other Ambulatory Visit: Payer: Self-pay

## 2021-03-30 DIAGNOSIS — E876 Hypokalemia: Secondary | ICD-10-CM | POA: Diagnosis not present

## 2021-03-30 DIAGNOSIS — T8183XA Persistent postprocedural fistula, initial encounter: Secondary | ICD-10-CM | POA: Diagnosis present

## 2021-03-30 DIAGNOSIS — Z8546 Personal history of malignant neoplasm of prostate: Secondary | ICD-10-CM

## 2021-03-30 DIAGNOSIS — E871 Hypo-osmolality and hyponatremia: Secondary | ICD-10-CM | POA: Diagnosis not present

## 2021-03-30 DIAGNOSIS — Z01818 Encounter for other preprocedural examination: Secondary | ICD-10-CM

## 2021-03-30 DIAGNOSIS — Z86711 Personal history of pulmonary embolism: Secondary | ICD-10-CM | POA: Diagnosis present

## 2021-03-30 DIAGNOSIS — Z9049 Acquired absence of other specified parts of digestive tract: Secondary | ICD-10-CM

## 2021-03-30 DIAGNOSIS — I10 Essential (primary) hypertension: Secondary | ICD-10-CM | POA: Diagnosis present

## 2021-03-30 DIAGNOSIS — E119 Type 2 diabetes mellitus without complications: Secondary | ICD-10-CM | POA: Diagnosis present

## 2021-03-30 DIAGNOSIS — D62 Acute posthemorrhagic anemia: Secondary | ICD-10-CM | POA: Diagnosis present

## 2021-03-30 DIAGNOSIS — Z7901 Long term (current) use of anticoagulants: Secondary | ICD-10-CM

## 2021-03-30 DIAGNOSIS — K921 Melena: Secondary | ICD-10-CM | POA: Diagnosis not present

## 2021-03-30 DIAGNOSIS — B20 Human immunodeficiency virus [HIV] disease: Secondary | ICD-10-CM | POA: Diagnosis present

## 2021-03-30 DIAGNOSIS — B49 Unspecified mycosis: Secondary | ICD-10-CM | POA: Diagnosis present

## 2021-03-30 DIAGNOSIS — Z794 Long term (current) use of insulin: Secondary | ICD-10-CM

## 2021-03-30 DIAGNOSIS — K632 Fistula of intestine: Secondary | ICD-10-CM | POA: Diagnosis not present

## 2021-03-30 DIAGNOSIS — E891 Postprocedural hypoinsulinemia: Secondary | ICD-10-CM | POA: Diagnosis present

## 2021-03-30 DIAGNOSIS — Y836 Removal of other organ (partial) (total) as the cause of abnormal reaction of the patient, or of later complication, without mention of misadventure at the time of the procedure: Secondary | ICD-10-CM | POA: Diagnosis present

## 2021-03-30 DIAGNOSIS — Z79890 Hormone replacement therapy (postmenopausal): Secondary | ICD-10-CM

## 2021-03-30 DIAGNOSIS — Z90411 Acquired partial absence of pancreas: Secondary | ICD-10-CM

## 2021-03-30 DIAGNOSIS — Y848 Other medical procedures as the cause of abnormal reaction of the patient, or of later complication, without mention of misadventure at the time of the procedure: Secondary | ICD-10-CM | POA: Diagnosis not present

## 2021-03-30 DIAGNOSIS — B965 Pseudomonas (aeruginosa) (mallei) (pseudomallei) as the cause of diseases classified elsewhere: Secondary | ICD-10-CM | POA: Diagnosis not present

## 2021-03-30 DIAGNOSIS — K8689 Other specified diseases of pancreas: Secondary | ICD-10-CM | POA: Diagnosis present

## 2021-03-30 DIAGNOSIS — I2699 Other pulmonary embolism without acute cor pulmonale: Principal | ICD-10-CM | POA: Diagnosis present

## 2021-03-30 DIAGNOSIS — Z923 Personal history of irradiation: Secondary | ICD-10-CM

## 2021-03-30 DIAGNOSIS — E872 Acidosis, unspecified: Secondary | ICD-10-CM | POA: Diagnosis present

## 2021-03-30 DIAGNOSIS — Z9081 Acquired absence of spleen: Secondary | ICD-10-CM

## 2021-03-30 DIAGNOSIS — Q453 Other congenital malformations of pancreas and pancreatic duct: Secondary | ICD-10-CM

## 2021-03-30 DIAGNOSIS — E139 Other specified diabetes mellitus without complications: Secondary | ICD-10-CM | POA: Diagnosis present

## 2021-03-30 DIAGNOSIS — R578 Other shock: Secondary | ICD-10-CM | POA: Diagnosis present

## 2021-03-30 DIAGNOSIS — F339 Major depressive disorder, recurrent, unspecified: Secondary | ICD-10-CM | POA: Diagnosis present

## 2021-03-30 DIAGNOSIS — I4891 Unspecified atrial fibrillation: Secondary | ICD-10-CM | POA: Diagnosis not present

## 2021-03-30 DIAGNOSIS — H9190 Unspecified hearing loss, unspecified ear: Secondary | ICD-10-CM | POA: Diagnosis present

## 2021-03-30 DIAGNOSIS — T80211A Bloodstream infection due to central venous catheter, initial encounter: Secondary | ICD-10-CM | POA: Diagnosis not present

## 2021-03-30 DIAGNOSIS — E039 Hypothyroidism, unspecified: Secondary | ICD-10-CM | POA: Diagnosis present

## 2021-03-30 DIAGNOSIS — K863 Pseudocyst of pancreas: Secondary | ICD-10-CM | POA: Diagnosis present

## 2021-03-30 DIAGNOSIS — R7881 Bacteremia: Secondary | ICD-10-CM | POA: Diagnosis not present

## 2021-03-30 DIAGNOSIS — N179 Acute kidney failure, unspecified: Secondary | ICD-10-CM | POA: Diagnosis not present

## 2021-03-30 DIAGNOSIS — K219 Gastro-esophageal reflux disease without esophagitis: Secondary | ICD-10-CM | POA: Diagnosis present

## 2021-03-30 DIAGNOSIS — K91840 Postprocedural hemorrhage and hematoma of a digestive system organ or structure following a digestive system procedure: Secondary | ICD-10-CM | POA: Diagnosis present

## 2021-03-30 DIAGNOSIS — E782 Mixed hyperlipidemia: Secondary | ICD-10-CM | POA: Diagnosis present

## 2021-03-30 LAB — HEPATIC FUNCTION PANEL
ALBUMIN: 2.8 g/dL — ABNORMAL LOW (ref 3.4–4.8)
ALKALINE PHOSPHATASE: 104 U/L (ref 45–115)
ALT (SGPT): 38 U/L (ref 10–55)
AST (SGOT): 26 U/L (ref 8–45)
BILIRUBIN DIRECT: 0.2 mg/dL (ref 0.1–0.4)
BILIRUBIN TOTAL: 0.3 mg/dL (ref 0.3–1.3)
PROTEIN TOTAL: 6.7 g/dL (ref 6.0–8.0)

## 2021-03-30 LAB — VENOUS BLOOD GAS WITH LACTATE REFLEX
%FIO2 (VENOUS): 21 %
BASE EXCESS: 3.1 mmol/L — ABNORMAL HIGH (ref <=3.0)
BICARBONATE (VENOUS): 26.2 mmol/L — ABNORMAL HIGH (ref 22.0–26.0)
LACTATE: 3 mmol/L — ABNORMAL HIGH (ref 0.0–1.3)
PCO2 (VENOUS): 38 mm/Hg — ABNORMAL LOW (ref 41–51)
PH (VENOUS): 7.46 — ABNORMAL HIGH (ref 7.31–7.41)
PO2 (VENOUS): 27 mm/Hg — ABNORMAL LOW (ref 35–50)

## 2021-03-30 LAB — PTT (PARTIAL THROMBOPLASTIN TIME): APTT: 26.7 seconds (ref 24.2–37.5)

## 2021-03-30 LAB — LIPASE: LIPASE: 15 U/L (ref 10–60)

## 2021-03-30 LAB — BASIC METABOLIC PANEL
ANION GAP: 13 mmol/L (ref 4–13)
BUN/CREA RATIO: 16 (ref 6–22)
BUN: 20 mg/dL (ref 8–25)
CALCIUM: 9.4 mg/dL (ref 8.8–10.2)
CHLORIDE: 98 mmol/L (ref 96–111)
CO2 TOTAL: 22 mmol/L — ABNORMAL LOW (ref 23–31)
CREATININE: 1.23 mg/dL (ref 0.75–1.35)
ESTIMATED GFR: 61 mL/min/BSA (ref >=60)
GLUCOSE: 158 mg/dL — ABNORMAL HIGH (ref 65–125)
POTASSIUM: 4.1 mmol/L (ref 3.5–5.1)
SODIUM: 133 mmol/L — ABNORMAL LOW (ref 136–145)

## 2021-03-30 LAB — PT/INR
INR: 1.08 (ref 0.80–1.20)
PROTHROMBIN TIME: 12.4 seconds (ref 9.1–13.9)

## 2021-03-30 LAB — CBC WITH DIFF
HCT: 34.1 % — ABNORMAL LOW (ref 38.9–52.0)
MCH: 31.4 pg (ref 26.0–32.0)

## 2021-03-30 LAB — HEPATITIS C ANTIBODY SCREEN WITH REFLEX TO HCV PCR: HCV ANTIBODY QUALITATIVE: NEGATIVE

## 2021-03-30 MED ORDER — SODIUM CHLORIDE 0.9 % INTRAVENOUS PIGGYBACK
2.0000 g | Freq: Two times a day (BID) | INTRAVENOUS | Status: DC
Start: 2021-03-31 — End: 2021-03-31

## 2021-03-30 MED ORDER — LACTATED RINGERS IV BOLUS
1000.0000 mL | INJECTION | Status: AC
Start: 2021-03-31 — End: 2021-03-30
  Administered 2021-03-30: 1000 mL via INTRAVENOUS
  Administered 2021-03-30: 0 mL via INTRAVENOUS

## 2021-03-30 MED ORDER — MORPHINE 4 MG/ML INTRAVENOUS SOLUTION
4.0000 mg | INTRAVENOUS | Status: AC
Start: 2021-03-30 — End: 2021-03-30
  Administered 2021-03-30: 4 mg via INTRAVENOUS
  Filled 2021-03-30: qty 1

## 2021-03-30 MED ORDER — VANCOMYCIN 10 GRAM INTRAVENOUS SOLUTION
20.0000 mg/kg | INTRAVENOUS | Status: DC
Start: 2021-03-30 — End: 2021-03-31
  Administered 2021-03-31: 0 mg via INTRAVENOUS
  Administered 2021-03-31: 1500 mg via INTRAVENOUS
  Filled 2021-03-30: qty 15

## 2021-03-30 MED ORDER — METRONIDAZOLE 500 MG/100 ML IN SODIUM CHLOR(ISO) INTRAVENOUS PIGGYBACK
500.0000 mg | INJECTION | INTRAVENOUS | Status: AC
Start: 2021-03-30 — End: 2021-03-31
  Administered 2021-03-31: 0 mg via INTRAVENOUS
  Administered 2021-03-31: 500 mg via INTRAVENOUS
  Filled 2021-03-30: qty 100

## 2021-03-30 MED ORDER — SODIUM CHLORIDE 0.9 % INTRAVENOUS PIGGYBACK
2.0000 g | INTRAVENOUS | Status: AC
Start: 2021-03-31 — End: 2021-03-30
  Administered 2021-03-30: 2 g via INTRAVENOUS
  Administered 2021-03-30: 0 g via INTRAVENOUS
  Filled 2021-03-30: qty 12.5

## 2021-03-30 NOTE — ED Attending Note (Signed)
I was physically present and directly supervised this patient's care. Patient was seen and examined. The APP/resident's history and exam were reviewed. Key elements in addition to and/or correction of that documentation are as follows:    [name] is a 76 y.o. male presents with severe abdominal pain. History prostate cancer and HIV. Recent ex lap, pancreatectomy, and splenectomy. Does endorse some dyspnea.    Refer to Resident/APP Note for additional history.     PMH: See Primary Note  PSH: See Primary Note  SxH: See Primary Note  FH: See Primary Note    Meditations: See Primary Note    No Known Allergies      Pertinent Exam  ED Triage Vitals [03/30/21 2206]   BP (Non-Invasive) (!) 142/81   Heart Rate (!) 147   Respiratory Rate (!) 22   Temperature 36.4 C (97.5 F)   SpO2 91 %   Weight 76.1 kg (167 lb 12.3 oz)   Height 1.753 m (5\' 9" )     Agree w resident/APP    Course      Significant abdominal tenderness. Obtaining CT for evaluation of potential post op infection, developing obstruction, bleeding, or other post op complication. Concern for developing sepsis. IVFs given. Cefepime and flagyl given. Surgery consulted for evaluation.  New oxygen requirement and tachycardia. PE considered.  Found to have PE. CT abdomen with expected post-op changes. Heparin initiated.     Impression:   Clinical Impression   Pulmonary embolism, unspecified chronicity, unspecified pulmonary embolism type, unspecified whether acute cor pulmonale present (CMS HCC) (Primary)   Pulmonary embolism (CMS [facility])     Disposition:  Admitted    Weston Brass, MD 03/30/2021, 22:48  Emergency Medicine Attending    Chart completed after conclusion of patient care due to time constraints of direct patient care during shift.

## 2021-03-30 NOTE — H&P (Signed)
[facility]  Surgical Oncology H&P    [name], [name], 76 y.o. 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     Patient states that around 5 pm this evening he started having acute onset rigors as well as acute onset pain around his JP drain. He denies any subjective fever or chills. He had a nurse friend who came to see him and was concerned that he may have an infection and he came to the ER for evaluation. He states that he was tolerating a diet, no diarrhea/constipation, nausea/vomiting. His JP drain output has been constant and pink in color and changed to a murkey color over the last days (~2 tablespoons a day). Denies any leakage or drainage from the midline abdominal wound. States the pain is 6/10 is near the drain site and radiates across his epigastrium. He has not taken any pain medications since the pain began.     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 He had bilateral ESP catheters placed on 2/20. His drain amylase was 6554 on Day 1 and 2187 on Day 3. Patient was discharged on 2/23.     Of note the pt has a history of an infection of the right hip and left groin in the past. Both of these were MRSA infections.     ROS:  MUST comment on all "Abnormal" findings   ROS Other than ROS in the HPI, all other systems were negative.    PAST MEDICAL/ FAMILY/ SOCIAL HISTORY:       Past Medical History:   Diagnosis  Date    Cancer (CMS [place])     prostate    Deep vein thrombosis (DVT) (CMS [facility])     right leg    Esophageal reflux     H/O hearing loss     High cholesterol     History of kidney disease     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    HTN (hypertension)     Human immunodeficiency virus (HIV) disease (CMS [facility])     Hyperlipidemia     "borderline"    Hypothyroidism     MRSA (methicillin resistant staph aureus) culture positive 01/02/2021    MRSA left groin abscess 01/03/21    MRSA (methicillin resistant staph aureus) culture positive 01/02/2021    MRSA blood 01/02/21    Thyroid disorder     Wears glasses          Past Surgical History:   Procedure Laterality Date    COLON SURGERY      per patient had 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 Darryl Nestle CERVICAL SPINE SURGERY  2001  HX COLOSTOMY REVERSAL      HX HERNIA REPAIR      KNEE SURGERY Bilateral     WRIST SURGERY Right          No Known Allergies  Medications Prior to Admission     Prescriptions    amLODIPine (NORVASC) 5 mg Oral Tablet    Take 1 Tablet (5 mg total) by mouth Once a day    atorvastatin (LIPITOR) 80 mg Oral Tablet    Take 40 mg by mouth Once a day Take one half pill every morning with breakfast (40 mg)    BIOTIN ORAL    Take by mouth    Blood Sugar Diagnostic (ACCU-CHEK GUIDE TEST STRIPS) Strip    1 Strip Four times a day - before meals and bedtime    cetirizine (ZYRTEC) 10 mg Oral Tablet    Take 1 Tablet (10 mg total) by mouth Once a day    elviteg-cob-emtri-tenof ALAFEN (GENVOYA) 150-150-200-10 mg Oral Tablet    1 Tablet Once a day With the evening meal    flash glucose scanning reader (FREESTYLE LIBRE 2 READER) Does not apply Misc    Use as directed with FreeStyle Libre 2 sensors    flash glucose sensor (FREESTYLE LIBRE 2 SENSOR) Does not apply Kit    To check blood glucose  continuously. Change every 14 days    gabapentin (NEURONTIN) 300 mg Oral Capsule    Take 1 Capsule (300 mg total) by mouth Twice daily    insulin glargine (LANTUS SOLOSTAR U-100 INSULIN) 100 unit/mL Subcutaneous Insulin Pen    Inject 15 units under the skin daily    insulin lispro (HUMALOG KWIKPEN INSULIN) 100 unit/mL Subcutaneous Insulin Pen    Inject 4 units with each meal. 1 box = 140 day supply    insulin NPH isoph U-100 human (NOVOLIN N NPH U-100 INSULIN) 100 unit/mL Subcutaneous Suspension    Use 8 u in the morning and 7 u in the evening.    insulin NPH isoph U-100 human (NOVOLIN N NPH U-100 INSULIN) 100 unit/mL Subcutaneous Suspension    Inject 8 units under skin in the morning, and 7 units in the evening.    insulin regular human (NOVOLIN R REGULAR U-100 INSULN) 100 unit/mL Injection Solution    Use  4 u in the morning before breakfast and 3 u in the evening before dinner.    insulin regular human (NOVOLIN R REGULAR U-100 INSULN) 100 unit/mL Injection Solution    Inject 4 units under skin before breakfast and 3 units before dinner.    insulin syr/ndl U100 half mark 0.3 mL 31 gauge x 5/16" Syringe    Use a new syringe for each injection.    lancets (FREESTYLE LANCETS) 28 gauge Misc    Use to test blood sugar 4 times a day - before meals and bedtime    levothyroxine (SYNTHROID) 88 mcg Oral Tablet    Take 75 mcg by mouth Every morning    melatonin 10 mg Oral Capsule    Take by mouth Every night    MULTIVITAMIN ORAL    Take by mouth    oxyCODONE (ROXICODONE) 5 mg Oral Tablet    Take 1 Tablet (5 mg total) by mouth Every 6 hours as needed for Pain for up to 7 days    pantoprazole (PROTONIX) 40 mg Oral Tablet, Delayed Release (E.C.)    Take 1 Tablet (40 mg total) by mouth Twice daily 30 minutes before a meal  Pen Needle, Disposable, (BD NANO 2ND GEN PEN NEEDLE) 32 gauge x 5/32" Needle    Use a new pen needle for each injection.    POTASSIUM ORAL    Take by mouth         cefepime (MAXIPIME) 2 g in NS 100 mL IVPB  minibag, 2 g, Intravenous, Q12H  metroNIDAZOLE (FLAGYL) 500 mg in NS 100 mL premix IVPB, 500 mg, Intravenous, Now  vancomycin (VANCOCIN) 1,500 mg in NS 500 mL IVPB, 20 mg/kg (Adjusted), Intravenous, Now      Past Surgical History:   Procedure Laterality Date    COLON SURGERY      per patient had 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 Darryl Nestle CERVICAL SPINE SURGERY  2001    HX COLOSTOMY REVERSAL      HX HERNIA REPAIR      KNEE SURGERY Bilateral     WRIST SURGERY Right          Family History:    Family Medical History:     Problem Relation (Age of Onset)    Cancer Mother, Father, Other    Heart Attack Mother    High Cholesterol Other    Stroke Other        Social History     Tobacco Use    Smoking status: Never    Smokeless tobacco: Never   Vaping Use    Vaping Use: Never used   Substance Use Topics    Alcohol use: Not Currently    Drug use: Never         PHYSICAL EXAMINATION: MUST comment on all "Abnormal" findings    Exam Temperature: 36.4 C (97.5 F)  Heart Rate: (!) 120  BP (Non-Invasive): (!) 161/80  Respiratory Rate: (!) 21  SpO2: 91 %  GEN:   Appears acutely ill, no distress  HEENT:   Normocephalic; atraumatic.  PULM:   Equal chest rise. New O2 requirement, sating 90's on 2L  CV:   Tachycardic, regular rhythm.  ABD:   Abdomen soft, mild left sided tenderness, fibrinous discharge present around JP drain, JP drain with grayish drainage, mild distention.  No rebound, guarding.   MS: Atraumatic.  Distal pulses intact.    NEURO:   Alert and oriented to person, place and time.   Integumentary:  Pink, warm, and dry and intact throughout without evidence of injury.  Ext: right lower leg swelling, + Homan's   WOUND/INCISION: Midline abdominal incision: no erythema, or drainage, intact. Staples present.     Labs Ordered/ Reviewed (Please indicate  ordered or reviewed)   Reviewed: Labs:  I have reviewed all lab results.  Lab Results Today:    Results for orders placed or performed during the hospital encounter of 03/30/21 (from the past 24 hour(s))   BASIC METABOLIC PANEL   Result Value Ref Range    SODIUM 133 (L) 136 - 145 mmol/L    POTASSIUM 4.1 3.5 - 5.1 mmol/L    CHLORIDE 98 96 - 111 mmol/L    CO2 TOTAL 22 (L) 23 - 31 mmol/L    ANION GAP 13 4 - 13 mmol/L    CALCIUM 9.4 8.8 - 10.2 mg/dL    GLUCOSE 158 (H) 65 - 125 mg/dL    BUN 20 8 -  25 mg/dL    CREATININE 1.23 0.75 - 1.35 mg/dL    BUN/CREA RATIO 16 6 - 22    ESTIMATED GFR 61 >=60 mL/min/BSA   HEPATIC FUNCTION PANEL   Result Value Ref Range    ALBUMIN 2.8 (L) 3.4 - 4.8 g/dL     ALKALINE PHOSPHATASE 104 45 - 115 U/L    ALT (SGPT) 38 10 - 55 U/L    AST (SGOT)  26 8 - 45 U/L    BILIRUBIN TOTAL 0.3 0.3 - 1.3 mg/dL    BILIRUBIN DIRECT 0.2 0.1 - 0.4 mg/dL    PROTEIN TOTAL 6.7 6.0 - 8.0 g/dL   LIPASE   Result Value Ref Range    LIPASE 15 10 - 60 U/L   PT/INR   Result Value Ref Range    PROTHROMBIN TIME 12.4 9.1 - 13.9 seconds    INR 1.08 0.80 - 1.20   PTT (PARTIAL THROMBOPLASTIN TIME)   Result Value Ref Range    APTT 26.7 24.2 - 37.5 seconds   TYPE AND SCREEN   Result Value Ref Range    UNITS ORDERED NOT STATED     ABO/RH(D) A NEGATIVE     ANTIBODY SCREEN NEGATIVE     SPECIMEN EXPIRATION DATE 04/02/2021,2359    VENOUS BLOOD GAS WITH LACTATE REFLEX   Result Value Ref Range    %FIO2 (VENOUS) 21.0 %    PH (VENOUS) 7.46 (H) 7.31 - 7.41    PCO2 (VENOUS) 38 (L) 41 - 51 mm/Hg    PO2 (VENOUS) 27 (L) 35 - 50 mm/Hg    BASE EXCESS 3.1 (H) -3.0 - 3.0 mmol/L    BICARBONATE (VENOUS) 26.2 (H) 22.0 - 26.0 mmol/L    LACTATE 3.0 (H) 0.0 - 1.3 mmol/L   HEPATITIS C ANTIBODY SCREEN WITH REFLEX TO HCV PCR   Result Value Ref Range    HCV ANTIBODY QUALITATIVE Negative Negative   CBC WITH DIFF   Result Value Ref Range    WBC 17.8 (H) 3.7 - 11.0 x103/uL    RBC 3.60 (L) 4.50 - 6.10 x106/uL    HGB 11.3 (L) 13.4 - 17.5 g/dL    HCT 34.1 (L)  38.9 - 52.0 %    MCV 94.7 78.0 - 100.0 fL    MCH 31.4 26.0 - 32.0 pg    MCHC 33.1 31.0 - 35.5 g/dL    RDW-CV 17.0 (H) 11.5 - 15.5 %    PLATELETS 398 150 - 400 x103/uL    MPV 10.2 8.7 - 12.5 fL   MANUAL DIFF AND MORPHOLOGY-SYSMEX   Result Value Ref Range    NEUTROPHIL % 89 %    LYMPHOCYTE %  7 %    MONOCYTE % 2 %    EOSINOPHIL % 0 %    BASOPHIL % 0 %    METAMYELOCYTE %  3 %    NEUTROPHIL # 15.84 (H) 1.50 - 7.70 x103/uL    LYMPHOCYTE # 1.25 1.00 - 4.80 x103/uL    MONOCYTE # 0.36 0.20 - 1.10 x103/uL    EOSINOPHIL # <0.10 <=0.50 x103/uL    BASOPHIL # <0.10 <=0.20 x103/uL    NRBC FROM MANUAL DIFF 4 per 100 WBC    PLATELET CLUMPS Present (A) None    TOXIC GRANULATION Present (A) None   ECG 12-LEAD   Result Value Ref Range    Ventricular rate 117 BPM    Atrial Rate 117 BPM    PR Interval 138 ms    QRS  Duration 74 ms    QT Interval 322 ms    QTC Calculation 449 ms    Calculated P Axis 48 degrees    Calculated R Axis 2 degrees    Calculated T Axis 58 degrees       Radiology Tests Ordered/ Reviewed (Please indicate ordered or reviewed)   Reviewed:   Results for orders placed or performed during the hospital encounter of 03/30/21 (from the past 24 hour(s))   CT CHEST ABDOMEN PELVIS W IV CONTRAST     Status: None (Preliminary result)    Narrative    CT CHEST ABDOMEN PELVIS W IV CONTRAST performed on 03/31/2021 12:09 AM    INDICATION: 76 years old Male  recent ex lap with splenectomy and partial pancreatectomy on 2/20 with worsening pain and drainage surrounding drain site. Hx of MRSA. ? developing abscess vs pancreatic abscess/pseudocyst; New O2 requirment, recent surgery and DVT hx    TECHNIQUE: Axial images from the thoracic inlet through the pelvis obtained after administration of IV contrast with reformatted coronal and sagittal images, utilizing soft tissue and lung algorithms    CONTRAST: 100 cc of Isovue 370    RADIATION DOSE: 542.11 mGy.cm    COMPARISON: CT abdomen pelvis 01/02/2021    FINDINGS:  CHEST:  Lungs:  Mosaic attenuation is seen throughout the lung fields, likely related to expiratory imaging. Right greater than left basilar atelectasis. No pleural effusion or pneumothorax. Central airways are patent. No evidence of bronchiectasis. Limited evaluation for pulmonary nodules due to respiratory motion.    Heart/Mediastinum: No cardiomegaly, pericardial effusion, mediastinal mass, or suspicious mediastinal adenopathy. Aorta and main pulmonary artery are normal in diameter. There is a three-vessel aortic arch without significant atherosclerotic disease. Segmental pulmonary embolus involving the right lower lobe (series 3 images 64-69. No CT evidence of right heart strain. Thyroid is homogenous.    ABDOMEN:  Liver: No hepatomegaly or steatosis. Hypodense lesion within the left hepatic lobe, likely benign cyst versus small hemangioma.    Gallbladder/Biliary System: Gallbladder is physiologically distended. No pericholecystic fluid. No biliary duct dilation.    Spleen: Postoperative changes from splenectomy and distal pancreatectomy. Mild inflammatory change within the resection bed without evidence of organized fluid collection. Surgical drain remains in place.    Pancreas: Postoperative changes from distal pancreatectomy. Mild inflammation and ill-defined fluid within the resection bed. No rim-enhancing organized fluid collection to suggest abscess.    Adrenals: Grossly unremarkable bilaterally.    Kidney/Ureters/Bladder: Kidneys are symmetric in enhancement with multiple bilateral simple renal cysts. No hydronephrosis. Ureters are of normal course and caliber. The bladder is distended with no visible wall thickening or perivesicular stranding.    Reproductive Organs: Prostate is normal in size.    Bowel: The bowel is nondilated. No evidence for obstruction. Large stool burden within the proximal colon.  The stomach is partially distended with undigested contents.    Vasculature: The abdominal aorta is of normal caliber.  There are scattered atherosclerotic calcifications throughout the included vasculature. Hepatic and portal veins are patent.    Peritoneal Cavity/Lymph Nodes: Trace free fluid along the pancreas resection cavity. Scattered areas of mesenteric inflammation along the exploratory laparotomy incision and within the left upper quadrant surgical bed. No evidence of an organized fluid collection. No pneumoperitoneum. No suspicious lymphadenopathy.    Soft Tissues/Bones: No soft tissue hematoma or large ventral abdominal wall hernia. Multilevel degenerative changes of the spine. Exploratory laparotomy incision and surgical staples well aligned.  Impression    1.Right lower lobe segmental pulmonary embolus without CT evidence of right heart strain.  2.Expected postoperative changes from splenectomy and distal pancreatectomy with trace fluid and formation of the mesentery.  3.Large stool burden in the proximal colon.    Findings were discussed with Dr. Tomasa Hose of the emergency department by Dr. Tye Savoy at approximately 1240.          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 Imaging remarkable for right lower lobe segmental pulmonary embolus, expected post-op changes, and large stool burden in the proximal colon. Pt now has new O2 requirement.     Recommendations        Pulmonary Embolism   - CTA: + lower lobe segmental pulmonary embolus   - New O2 requirement, tachycardic    - s/p open pancreatectomy & splenectomy on 2/20   PLAN  - Thrombotic heparin protocol   - Lower peripheral duplexes ordered   - D/c antibiotics   - NPO   - Plasmalyte 100 ml/hr  - Telemetry ordered  - Home meds restarted: Norvasc, Lipitor, Neurontin, synthroid, Protonix, Genvoya  - Insulin regimen: 15 units Lantus, 4 units Humalog TID   - Ordered Lantus 7 units daily, held 4 units Humalog due to NPO status, Conservative  SSI & POC glucose q4h PRN ordered   - Bowel regimen: Miralax, colace          Zigmund Daniel, MD 03/30/2021, 23:48   PGY-1 [facility]  Pager [phone]

## 2021-03-30 NOTE — ED Provider Notes (Signed)
[facility] - Emergency Department  ED Primary Provider Note  History of Present Illness   Chief Complaint   Patient presents with   . 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  Pt states has a drainage tube to his Lt abdomen.  Pt states tonight started having increased pain.  Pt also had an episode of tremors around [place] is a 76 y.o. male who had concerns including Post-Op Problem.  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  Patient states that he has had increasing abdominal pain over the past 3 days especially surrounding his existing drain on his left lower abdomen.  He also reports increased drainage in change in color to yellow white drainage coming from both inside the drain in around the dressings which he changes frequently.  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  He also reports recent surgery for septic hip with MRSA.  He has history of HIV on antiretrovirals and diabetes.  He reports a bowel movement this morning and has been passing gas.        Review of Systems   Review of Systems   Constitutional: Positive for appetite change and fever. Negative for activity change and chills.   Respiratory: Positive for shortness of breath. Negative for chest tightness.    Cardiovascular: Positive for leg swelling. Negative for chest pain.   Gastrointestinal: Positive for abdominal pain and nausea. Negative for vomiting.   Genitourinary: Negative for dysuria and flank pain.   Neurological: Positive for weakness. Negative for facial asymmetry, light-headedness, numbness and headaches.     Historical Data   History Reviewed This Encounter: Medical History  Surgical History  Family  History  Social History      Physical Exam   ED Triage Vitals [03/30/21 2206]   BP (Non-Invasive) (!) 142/81   Heart Rate (!) 147   Respiratory Rate (!) 22   Temperature 36.4 C (97.5 F)   SpO2 91 %   Weight 76.1 kg (167 lb 12.3 oz)   Height 1.753 m (5\' 9" )     Physical Exam  Vitals and nursing note reviewed.   Constitutional:       Appearance: He is ill-appearing.      Comments: Chronically ill-appearing man lying in bed taking shallow breaths   Cardiovascular:      Rate and Rhythm: Regular rhythm. Tachycardia present.      Heart sounds: No murmur heard.    No friction rub. No gallop.   Pulmonary:      Comments: Taking shallow breaths due to abdominal pain, no focal ronchi, wheezes or rales appreciated.  Abdominal:      Comments: Large recent currently healing midline surgical scar across abdomen.  JP drain in left lower quadrant with white yellow drainage.  Large healing ecchymosis appreciated across surgical scar.  Severe tenderness to palpation to left lower quadrant adjacent to drain with rebound.  Voluntary guarding.  Nonrigid   Neurological:      Mental Status: He is alert.       Patient Data     Labs Ordered/Reviewed   BASIC METABOLIC PANEL - Abnormal; Notable for the following components:  Result Value    SODIUM 133 (*)     CO2 TOTAL 22 (*)     GLUCOSE 158 (*)     All other components within normal limits   HEPATIC FUNCTION PANEL - Abnormal; Notable for the following components:    ALBUMIN 2.8 (*)     All other components within normal limits   VENOUS BLOOD GAS WITH LACTATE REFLEX - Abnormal; Notable for the following components:    PH (VENOUS) 7.46 (*)     PCO2 (VENOUS) 38 (*)     PO2 (VENOUS) 27 (*)     BASE EXCESS 3.1 (*)     BICARBONATE (VENOUS) 26.2 (*)     LACTATE 3.0 (*)     All other components within normal limits   CBC WITH DIFF - Abnormal; Notable for the following components:    WBC 17.8 (*)     RBC 3.60 (*)     HGB 11.3 (*)     HCT 34.1 (*)     RDW-CV 17.0 (*)     All other components  within normal limits   MANUAL DIFF AND MORPHOLOGY-SYSMEX - Abnormal; Notable for the following components:    NEUTROPHIL # 15.84 (*)     PLATELET CLUMPS Present (*)     TOXIC GRANULATION Present (*)     All other components within normal limits   LIPASE - Normal   PT/INR - Normal    Narrative:     Coumadin therapy INR range for Conventional Anticoagulation is 2.0 to 3.0 and for Intensive Anticoagulation 2.5 to 3.5.   PTT (PARTIAL THROMBOPLASTIN TIME) - Normal    Narrative:     Therapeutic range for unfractionated heparin is 60-100 seconds.   HEPATITIS C ANTIBODY SCREEN WITH REFLEX TO HCV PCR - Normal   LACTIC ACID TIMED - Normal   COVID-19 [place] MOLECULAR LAB TESTING - Normal    Narrative:     Results are for the simultaneous qualitative identification of SARS-CoV-2 (formerly 2019-nCoV), Influenza A, Influenza B, and RSV RNA. These etiologic agents are generally detectable in nasopharyngeal and nasal swabs during the ACUTE PHASE of infection. Hence, this test is intended to be performed on respiratory specimens collected from individuals with signs and symptoms of upper respiratory tract infection who meet Centers for Disease Control and Prevention (CDC) clinical and/or epidemiological criteria for Coronavirus Disease 2019 (COVID-19) testing. CDC COVID-19 criteria for testing on human specimens is available at [facility] webpage information for Healthcare Professionals: Coronavirus Disease 2019 (COVID-19) ([email or web address]).     False-negative results may occur if the virus has genomic mutations, insertions, deletions, or rearrangements or if performed very early in the course of illness. Otherwise, negative results indicate virus specific RNA targets are not detected, however negative results do not preclude SARS-CoV-2 infection/COVID-19, Influenza, or Respiratory syncytial virus infection. Results should not be used as the sole basis for patient management decisions. Negative  results must be combined with clinical observations, patient history, and epidemiological information. If upper respiratory tract infection is still suspected based on exposure history together with other clinical findings, re-testing should be considered.    Disclaimer:   This assay has been authorized by FDA under an Emergency Use Authorization for use in laboratories certified under the Clinical Laboratory Improvement Amendments of 1988 (CLIA), 42 U.S.C. [phone], to perform high complexity tests. The impacts of vaccines, antiviral therapeutics, antibiotics, chemotherapeutic or immunosuppressant drugs have not been evaluated.     Test methodology:   Cepheid Xpert Xpress SARS-CoV-2/Flu/RSV Assay  real-time polymerase chain reaction (RT-PCR) test on the GeneXpert Dx and Xpert Xpress systems.   ADULT ROUTINE BLOOD CULTURE, SET OF 2 BOTTLES (BACTERIA AND YEAST)   ADULT ROUTINE BLOOD CULTURE, SET OF 2 BOTTLES (BACTERIA AND YEAST)   CBC/DIFF    Narrative:     The following orders were created for panel order CBC/DIFF.  Procedure                               Abnormality         Status                     ---------                               -----------         ------                     CBC WITH BTDV[[id number]]                Abnormal            Final result               MANUAL DIFF AND MORPHOLO.Marland KitchenMarland Kitchen[[id number]]  Abnormal            Final result                 Please view results for these tests on the individual orders.   EXTRA TUBES    Narrative:     The following orders were created for panel order EXTRA TUBES.  Procedure                               Abnormality         Status                     ---------                               -----------         ------                     DARK GREEN OEVO[[id number]]                                  Final result                 Please view results for these tests on the individual orders.   DARK GREEN TUBE   PERFORM POC WHOLE BLOOD GLUCOSE   PERFORM POC WHOLE BLOOD GLUCOSE    PERFORM POC WHOLE BLOOD GLUCOSE   PERFORM POC WHOLE BLOOD GLUCOSE   TYPE AND SCREEN     CT CHEST ABDOMEN PELVIS W IV CONTRAST   Final Result by Edi, Radresults In (03/01 0047)   1.Right lower and upper lobe segmental pulmonary emboli without CT evidence of right heart strain.   2.Expected postoperative changes from splenectomy and distal pancreatectomy with trace fluid along the resection cavity of the mesentery. No evidence of organized fluid collection.   3.Large stool burden in the proximal colon.  Findings were discussed with Dr. Tomasa Hose of the emergency department by Dr. Tye Savoy at approximately 1240.        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  Patient also with shallow respirations,  new oxygen requirement and reported shortness of breath after recent surgery and DVT.  PE was considered.  A CT angiogram of the chest and CT of the abdomen and pelvis with contrast were ordered to evaluate for both he and intra-abdominal infection versus postsurgical complication.  Given pancreatic surgery pancreatic pseudocyst and abscess were also considered.  Fluid resuscitation was started and patient was started on broad-spectrum antibiotics with vancomycin, Zosyn, cefepime.  Patient's case was discussed with Surgical Oncology team who agreed to evaluate the patient.  Pending admission patient was signed out to oncoming emergency department team.    Amount and/or Complexity of Data Reviewed  Labs: ordered.  Radiology: ordered.  ECG/medicine tests: ordered.      Risk  Prescription drug management.  Parenteral controlled substances.  Decision regarding hospitalization.               Medications Administered in the ED   heparin 25,000 units in NS 250 mL infusion (has  no administration in time range)   morphine 4 mg/mL injection (4 mg Intravenous Given 03/30/21 2242)   cefepime (MAXIPIME) 2 g in NS 100 mL IVPB minibag (0 g Intravenous Stopped 03/30/21 2330)   metroNIDAZOLE (FLAGYL) 500 mg in NS 100 mL premix IVPB (0 mg Intravenous Stopped 03/31/21 0114)   LR bolus infusion 1,000 mL (0 mL Intravenous Stopped 03/30/21 2344)   iopamidol (ISOVUE-370) 76% infusion (100 mL Intravenous Given 03/31/21 0002)   heparin 5,000 units/mL initial IV BOLUS (has no administration in time range)     Disposition pending at the time of sign out. Care of DEMITRIOUS MCCANNON was signed out to Dr. Raford Pitcher at 2330 following a discussion of the patient's course. Please refer to their Course Note for further details of the patient's ED course.         Christena Deem, MD   Emergency Medicine, PGY-1  03/31/21 17:59

## 2021-03-30 NOTE — Pharmacy (Signed)
[facility] / Department of Pharmaceutical Services  Therapeutic Drug Monitoring: Vancomycin  03/30/2021      Patient name: [name], [name]  Date of Birth:  11-Jul-1945    Actual Weight:  Weight: 76.1 kg (167 lb 12.3 oz) (03/30/21 2206)     BMI:  BMI (Calculated): 24.83 (03/30/21 2206)      Date RPh Current regimen (including mg/kg) Indication &  Organism AUC or trough based dosing Target Levels^ SCr (mg/dL) CrCl* (mL/min) Infectious Laboratory Markers (as applicable)   Measured level(s)   (mcg/mL) Calculated AUC (if AUC based monitoring) Plan & predicted AUC/trough if initial dosing (including when levels are due) Comments   2/28 mc Vancomycin 1500mg  (20 mg/kg) x1 Intra abdominal     WBC:  Procal:  CRP:   Dose x1 in ED. Follow up for continuation of therapy and consider levels if continued for >48h.                                                                                                    Katherina Mires levels depends on dosing and monitoring method, AUC vs. trough based. For AUC based dosing units are mg*h/L. For trough based dosing units are mcg/mL.     *Creatinine clearance is estimated by using the Cockcroft-Gault equation for adult patients and the Carol Ada for pediatric patients.    The decision to discontinue vancomycin therapy will be determined by the primary service.  Please contact the pharmacist with any questions regarding this patient's medication regimen.

## 2021-03-30 NOTE — ED Nurses Note (Signed)
Patient to ED 19 with c/o sudden onset shaking and lower abdominal pain. 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 Patient states that tonight he was eating dinner when he had sudden onset severe lower abdominal pain and continuous tremors starting around 1700. Patient took tylenol at home with little relief. Denies fevers/chills. Sutures to abdominal incision intact.

## 2021-03-31 ENCOUNTER — Ambulatory Visit (INDEPENDENT_AMBULATORY_CARE_PROVIDER_SITE_OTHER): Payer: 59 | Admitting: SURGICAL ONCOLOGY

## 2021-03-31 ENCOUNTER — Encounter (HOSPITAL_COMMUNITY): Payer: Self-pay | Admitting: Rehabilitative and Restorative Service Providers"

## 2021-03-31 ENCOUNTER — Emergency Department (EMERGENCY_DEPARTMENT_HOSPITAL): Payer: 59

## 2021-03-31 ENCOUNTER — Inpatient Hospital Stay (HOSPITAL_COMMUNITY): Payer: 59

## 2021-03-31 DIAGNOSIS — I82431 Acute embolism and thrombosis of right popliteal vein: Secondary | ICD-10-CM

## 2021-03-31 DIAGNOSIS — Z86718 Personal history of other venous thrombosis and embolism: Secondary | ICD-10-CM

## 2021-03-31 DIAGNOSIS — R935 Abnormal findings on diagnostic imaging of other abdominal regions, including retroperitoneum: Secondary | ICD-10-CM

## 2021-03-31 DIAGNOSIS — R195 Other fecal abnormalities: Secondary | ICD-10-CM

## 2021-03-31 DIAGNOSIS — Z90411 Acquired partial absence of pancreas: Secondary | ICD-10-CM

## 2021-03-31 DIAGNOSIS — D72829 Elevated white blood cell count, unspecified: Secondary | ICD-10-CM

## 2021-03-31 DIAGNOSIS — Z9081 Acquired absence of spleen: Secondary | ICD-10-CM

## 2021-03-31 DIAGNOSIS — I2699 Other pulmonary embolism without acute cor pulmonale: Secondary | ICD-10-CM

## 2021-03-31 DIAGNOSIS — Z8614 Personal history of Methicillin resistant Staphylococcus aureus infection: Secondary | ICD-10-CM

## 2021-03-31 DIAGNOSIS — Z9981 Dependence on supplemental oxygen: Secondary | ICD-10-CM

## 2021-03-31 DIAGNOSIS — Z86711 Personal history of pulmonary embolism: Secondary | ICD-10-CM | POA: Diagnosis present

## 2021-03-31 DIAGNOSIS — R000 Tachycardia, unspecified: Secondary | ICD-10-CM

## 2021-03-31 DIAGNOSIS — Z923 Personal history of irradiation: Secondary | ICD-10-CM

## 2021-03-31 DIAGNOSIS — R9431 Abnormal electrocardiogram [ECG] [EKG]: Secondary | ICD-10-CM

## 2021-03-31 DIAGNOSIS — Z978 Presence of other specified devices: Secondary | ICD-10-CM

## 2021-03-31 DIAGNOSIS — Z9049 Acquired absence of other specified parts of digestive tract: Secondary | ICD-10-CM

## 2021-03-31 HISTORY — DX: Other pulmonary embolism without acute cor pulmonale: I26.99

## 2021-03-31 LAB — TYPE AND SCREEN
ABO/RH(D): A NEG
ANTIBODY SCREEN: NEGATIVE

## 2021-03-31 LAB — VENOUS BLOOD GAS WITH LACTATE REFLEX
%FIO2 (VENOUS): 32 %
BASE EXCESS: 3.5 mmol/L — ABNORMAL HIGH (ref <=3.0)
BICARBONATE (VENOUS): 27.3 mmol/L — ABNORMAL HIGH (ref 22.0–26.0)
LACTATE: 0.6 mmol/L (ref 0.0–1.3)
PCO2 (VENOUS): 47 mm/Hg (ref 41–51)
PH (VENOUS): 7.4 (ref 7.31–7.41)
PO2 (VENOUS): 49 mm/Hg (ref 35–50)

## 2021-03-31 LAB — CBC WITH DIFF
HGB: 11.3 g/dL — ABNORMAL LOW (ref 13.4–17.5)
MCHC: 33.1 g/dL (ref 31.0–35.5)
MCV: 94.7 fL (ref 78.0–100.0)
MPV: 10.2 fL (ref 8.7–12.5)
PLATELETS: 398 10*3/uL (ref 150–400)
RBC: 3.6 10*6/uL — ABNORMAL LOW (ref 4.50–6.10)
RDW-CV: 17 % — ABNORMAL HIGH (ref 11.5–15.5)
WBC: 17.8 10*3/uL — ABNORMAL HIGH (ref 3.7–11.0)

## 2021-03-31 LAB — ECG 12-LEAD
Atrial Rate: 117 {beats}/min
Calculated P Axis: 48 degrees
Calculated R Axis: 2 degrees
Calculated T Axis: 58 degrees
PR Interval: 138 ms
QRS Duration: 74 ms
QT Interval: 322 ms
QTC Calculation: 449 ms
Ventricular rate: 117 {beats}/min

## 2021-03-31 LAB — MANUAL DIFF AND MORPHOLOGY-SYSMEX
BASOPHIL #: <0.1 10*3/uL (ref <=0.20)
BASOPHIL %: 0 %
EOSINOPHIL #: <0.1 10*3/uL (ref <=0.50)
EOSINOPHIL %: 0 %
LYMPHOCYTE #: 1.25 10*3/uL (ref 1.00–4.80)
LYMPHOCYTE %: 7 %
METAMYELOCYTE %: 3 %
MONOCYTE #: 0.36 10*3/uL (ref 0.20–1.10)
MONOCYTE %: 2 %
NEUTROPHIL #: 15.84 10*3/uL — ABNORMAL HIGH (ref 1.50–7.70)
NEUTROPHIL %: 89 %
NRBC FROM MANUAL DIFF: 4 per 100 WBC

## 2021-03-31 LAB — POC BLOOD GLUCOSE (RESULTS)
GLUCOSE, POC: 137 mg/dl — ABNORMAL HIGH (ref 70–105)
GLUCOSE, POC: 137 mg/dl — ABNORMAL HIGH (ref 70–105)
GLUCOSE, POC: 136 mg/dl — ABNORMAL HIGH (ref 70–105)
GLUCOSE, POC: 173 mg/dl — ABNORMAL HIGH (ref 70–105)

## 2021-03-31 LAB — COVID-19 ~~LOC~~ MOLECULAR LAB TESTING
INFLUENZA VIRUS TYPE A: NOT DETECTED
INFLUENZA VIRUS TYPE B: NOT DETECTED
RESPIRATORY SYNCTIAL VIRUS (RSV): NOT DETECTED
SARS-CoV-2: NOT DETECTED

## 2021-03-31 LAB — PTT (PARTIAL THROMBOPLASTIN TIME)
APTT: 86.4 seconds — ABNORMAL HIGH (ref 24.2–37.5)
APTT: 63.9 seconds — ABNORMAL HIGH (ref 24.2–37.5)
APTT: 90.3 seconds — ABNORMAL HIGH (ref 24.2–37.5)

## 2021-03-31 LAB — LACTIC ACID TIMED: LACTIC ACID: 1.2 mmol/L (ref 0.5–2.2)

## 2021-03-31 LAB — DARK GREEN TUBE

## 2021-03-31 MED ORDER — ELVITEG 150 MG-COB 150 MG-EMTRICIT 200 MG-TENOFO ALAFENAM 10 MG TABLET
1.0000 | ORAL_TABLET | Freq: Every day | ORAL | Status: DC
Start: 2021-03-31 — End: 2021-04-02
  Administered 2021-03-31 – 2021-04-02 (×3): 1 via ORAL
  Filled 2021-03-31 (×4): qty 1

## 2021-03-31 MED ORDER — METHOCARBAMOL 500 MG TABLET
500.0000 mg | ORAL_TABLET | Freq: Four times a day (QID) | ORAL | Status: DC
Start: 2021-03-31 — End: 2021-04-02
  Administered 2021-03-31 – 2021-04-02 (×10): 500 mg via ORAL
  Filled 2021-03-31 (×10): qty 1

## 2021-03-31 MED ORDER — IOPAMIDOL 370 MG IODINE/ML (76 %) INTRAVENOUS SOLUTION
100.0000 mL | INTRAVENOUS | Status: AC
Start: 2021-03-31 — End: 2021-03-31
  Administered 2021-03-31: 100 mL via INTRAVENOUS

## 2021-03-31 MED ORDER — HEPARIN (PORCINE) 5,000 UNITS/ML BOLUS
80.0000 [IU]/kg | Freq: Once | INTRAMUSCULAR | Status: AC
Start: 2021-03-31 — End: 2021-03-31
  Administered 2021-03-31: 6000 [IU] via INTRAVENOUS
  Filled 2021-03-31: qty 2

## 2021-03-31 MED ORDER — GABAPENTIN 300 MG CAPSULE
300.0000 mg | ORAL_CAPSULE | Freq: Two times a day (BID) | ORAL | Status: DC
Start: 2021-03-31 — End: 2021-04-02
  Administered 2021-03-31 – 2021-04-02 (×5): 300 mg via ORAL
  Filled 2021-03-31 (×5): qty 1

## 2021-03-31 MED ORDER — ACETAMINOPHEN 325 MG TABLET
650.0000 mg | ORAL_TABLET | ORAL | Status: DC | PRN
Start: 2021-03-31 — End: 2021-04-02
  Administered 2021-03-31 – 2021-04-02 (×6): 650 mg via ORAL
  Filled 2021-03-31 (×6): qty 2

## 2021-03-31 MED ORDER — SODIUM CHLORIDE 0.9 % (FLUSH) INJECTION SYRINGE
2.0000 mL | INJECTION | Freq: Three times a day (TID) | INTRAMUSCULAR | Status: DC
Start: 2021-03-31 — End: 2021-04-02
  Administered 2021-03-31: 5 mL
  Administered 2021-03-31: 0 mL
  Administered 2021-03-31: 5 mL
  Administered 2021-04-01: 6 mL
  Administered 2021-04-01: 3 mL
  Administered 2021-04-01: 5 mL
  Administered 2021-04-02: 3 mL
  Administered 2021-04-02: 5 mL

## 2021-03-31 MED ORDER — ATORVASTATIN 40 MG TABLET
40.0000 mg | ORAL_TABLET | Freq: Every day | ORAL | Status: DC
Start: 2021-03-31 — End: 2021-04-02
  Administered 2021-03-31 – 2021-04-02 (×3): 40 mg via ORAL
  Filled 2021-03-31 (×3): qty 1

## 2021-03-31 MED ORDER — HEPARIN (PORCINE) 5,000 UNITS/ML BOLUS
80.0000 [IU]/kg | Freq: Once | INTRAMUSCULAR | Status: DC
Start: 2021-03-31 — End: 2021-03-31

## 2021-03-31 MED ORDER — INSULIN LISPRO 100 UNIT/ML SUB-Q SSIP
0.0000 [IU] | INJECTION | SUBCUTANEOUS | Status: DC | PRN
Start: 2021-03-31 — End: 2021-04-02
  Administered 2021-03-31: 2 [IU] via SUBCUTANEOUS
  Administered 2021-04-01: 4 [IU] via SUBCUTANEOUS
  Filled 2021-03-31: qty 12
  Filled 2021-03-31: qty 6

## 2021-03-31 MED ORDER — PANTOPRAZOLE 40 MG TABLET,DELAYED RELEASE
40.0000 mg | DELAYED_RELEASE_TABLET | Freq: Two times a day (BID) | ORAL | Status: DC
Start: 2021-03-31 — End: 2021-04-02
  Administered 2021-03-31 – 2021-04-02 (×5): 40 mg via ORAL
  Filled 2021-03-31 (×5): qty 1

## 2021-03-31 MED ORDER — INSULIN GLARGINE-YFGN (U-100) 100 UNIT/ML SUBCUTANEOUS SOLUTION
7.0000 [IU] | Freq: Every evening | SUBCUTANEOUS | Status: DC
Start: 2021-03-31 — End: 2021-04-02
  Administered 2021-03-31 – 2021-04-01 (×2): 7 [IU] via SUBCUTANEOUS
  Filled 2021-03-31 (×3): qty 7

## 2021-03-31 MED ORDER — LEVOTHYROXINE 75 MCG TABLET
75.0000 ug | ORAL_TABLET | Freq: Every morning | ORAL | Status: DC
Start: 2021-03-31 — End: 2021-04-01
  Administered 2021-03-31 – 2021-04-01 (×2): 75 ug via ORAL
  Filled 2021-03-31 (×2): qty 1

## 2021-03-31 MED ORDER — AMLODIPINE 5 MG TABLET
5.0000 mg | ORAL_TABLET | Freq: Every day | ORAL | Status: DC
Start: 2021-03-31 — End: 2021-04-02
  Administered 2021-03-31 – 2021-04-02 (×3): 5 mg via ORAL
  Filled 2021-03-31 (×3): qty 1

## 2021-03-31 MED ORDER — POLYETHYLENE GLYCOL 3350 17 GRAM ORAL POWDER PACKET
17.0000 g | Freq: Two times a day (BID) | ORAL | Status: DC
Start: 2021-03-31 — End: 2021-04-02
  Administered 2021-03-31 (×2): 17 g via ORAL
  Administered 2021-04-01 – 2021-04-02 (×3): 0 g via ORAL
  Filled 2021-03-31 (×4): qty 1

## 2021-03-31 MED ORDER — ELECTROLYTE-A INTRAVENOUS SOLUTION
INTRAVENOUS | Status: DC
Start: 2021-03-31 — End: 2021-03-31
  Administered 2021-03-31: 0 mL via INTRAVENOUS

## 2021-03-31 MED ORDER — DEXTROSE 5% IN WATER (D5W) FLUSH BAG - 250 ML
INTRAVENOUS | Status: DC | PRN
Start: 2021-03-31 — End: 2021-04-02

## 2021-03-31 MED ORDER — OXYCODONE 10 MG TABLET
10.0000 mg | ORAL_TABLET | ORAL | Status: AC
Start: 2021-03-31 — End: 2021-03-31
  Administered 2021-03-31: 10 mg via ORAL
  Filled 2021-03-31: qty 1

## 2021-03-31 MED ORDER — ELECTROLYTE-A INTRAVENOUS SOLUTION BOLUS
500.0000 mL | Freq: Once | INTRAVENOUS | Status: AC
Start: 2021-03-31 — End: 2021-03-31
  Administered 2021-03-31: 500 mL via INTRAVENOUS
  Administered 2021-03-31: 0 mL via INTRAVENOUS

## 2021-03-31 MED ORDER — SODIUM CHLORIDE 0.9 % (FLUSH) INJECTION SYRINGE
2.0000 mL | INJECTION | INTRAMUSCULAR | Status: DC | PRN
Start: 2021-03-31 — End: 2021-04-02

## 2021-03-31 MED ORDER — DOCUSATE SODIUM 100 MG CAPSULE
100.0000 mg | ORAL_CAPSULE | Freq: Two times a day (BID) | ORAL | Status: DC
Start: 2021-03-31 — End: 2021-04-02
  Administered 2021-03-31 – 2021-04-01 (×3): 100 mg via ORAL
  Administered 2021-04-01 – 2021-04-02 (×2): 0 mg via ORAL
  Filled 2021-03-31 (×4): qty 1

## 2021-03-31 MED ORDER — HEPARIN 25,000 UNIT/250 ML (100 UNIT/ML) IN NS IV SOLN
18.0000 [IU]/kg/h | INTRAVENOUS | Status: DC
Start: 2021-03-31 — End: 2021-03-31

## 2021-03-31 MED ORDER — ELECTROLYTE-A INTRAVENOUS SOLUTION
INTRAVENOUS | Status: DC
Start: 2021-03-31 — End: 2021-04-01
  Administered 2021-04-01: 0 mL via INTRAVENOUS

## 2021-03-31 MED ORDER — INSULIN LISPRO (U-100) 100 UNIT/ML SUBCUTANEOUS SOLUTION
4.0000 [IU] | Freq: Three times a day (TID) | SUBCUTANEOUS | Status: DC
Start: 2021-03-31 — End: 2021-04-02
  Administered 2021-03-31 (×3): 0 [IU] via SUBCUTANEOUS
  Administered 2021-04-01 (×2): 4 [IU] via SUBCUTANEOUS
  Administered 2021-04-01 – 2021-04-02 (×2): 0 [IU] via SUBCUTANEOUS
  Administered 2021-04-02: 4 [IU] via SUBCUTANEOUS
  Filled 2021-03-31 (×3): qty 12

## 2021-03-31 MED ORDER — SODIUM CHLORIDE 0.9% FLUSH BAG - 250 ML
INTRAVENOUS | Status: AC | PRN
Start: 2021-03-31 — End: ?

## 2021-03-31 MED ORDER — HEPARIN 25,000 UNIT/250 ML (100 UNIT/ML) IN NS IV SOLN
18.0000 [IU]/kg/h | INTRAVENOUS | Status: DC
Start: 2021-03-31 — End: 2021-04-02
  Administered 2021-03-31 – 2021-04-01 (×10): 18 [IU]/kg/h via INTRAVENOUS
  Administered 2021-04-02 (×2): 19 [IU]/kg/h via INTRAVENOUS
  Administered 2021-04-02: 0 [IU]/kg/h via INTRAVENOUS
  Filled 2021-03-31 (×5): qty 250

## 2021-03-31 NOTE — Nurses Notes (Signed)
2042: Pt's aPTT was 90.3 which is therapeutic and requires no changes at this time. Pt will continue with 18u/kg/hr and this makes the 3rd therapeutic result. No aPTT will be at 0240, pt is aware.

## 2021-03-31 NOTE — ED Attending Handoff Note (Signed)
TRANSFER OF CARE:  Time:  0004 on 03/31/2021    I have assumed the care of [name] from Dr. Blenda Nicely after thoroughly discussing his condition and plan of care.  I will follow-up on any pending laboratory and/or diagnostic studies and guide further treatment and management of the patient.      [name] is a 76 y.o. male with a chief complaint of recent exploratory laparotomy.  Tachycardic, leukocytosis.  Given broad-spectrum antibiotics.  Patient presented with abdominal pain and shortness of breath.    PENDING:  CT pending; surgery consulted    COURSE:  CT chest/abdomen/pelvis with right lower and upper lobe segmental pulmonary emboli without evidence of right heart strain, expected postoperative changes from splenectomy and distal pancreatectomy with trace fluid along the resection cavity of the mesentery, large stool burden in the proximal colon.  Patient started on heparin drip while in ED. Patient admitted to Surgical Oncology service for further evaluation and management.    DISPOSITION:  Admitted    Blood pressure 136/79, pulse (!) 118, temperature (!) 38.2 C (100.8 F), resp. rate 20, height 1.753 m (5\' 9" ), weight 76.1 kg (167 lb 12.3 oz), SpO2 92 %.     Orders Placed This Encounter   . ADULT ROUTINE BLOOD CULTURE, SET OF 2 ADULT BOTTLES (BACTERIA AND YEAST)   . ADULT ROUTINE BLOOD CULTURE, SET OF 2 ADULT BOTTLES (BACTERIA AND YEAST)   . CANCELED: CT ABDOMEN PELVIS W IV CONTRAST   . CANCELED: CT CHEST FOR PULMONARY EMBOLUS W IV CONTRAST   . CT CHEST ABDOMEN PELVIS W IV CONTRAST   . BASIC METABOLIC PANEL   . CBC/DIFF   . HEPATIC FUNCTION PANEL   . LIPASE   . PT/INR   . PTT (PARTIAL THROMBOPLASTIN TIME)   . VENOUS BLOOD GAS WITH LACTATE REFLEX   . HEPATITIS C ANTIBODY SCREEN WITH REFLEX TO HCV PCR   . CBC WITH DIFF   . EXTRA TUBES   . DARK GREEN TUBE   . LACTIC ACID TIMED   . MANUAL DIFF AND MORPHOLOGY-SYSMEX   . CANCELED: CBC   . COVID-19 SCREENING - Placement - NON-PUI   . CBC/DIFF   .  BASIC METABOLIC PANEL   . PHOSPHORUS   . MAGNESIUM   . PTT (PARTIAL THROMBOPLASTIN TIME)   . OXYGEN - NASAL CANNULA   . ECG 12-LEAD   . POCT WHOLE BLOOD GLUCOSE   . TYPE AND SCREEN   . INSERT & MAINTAIN PERIPHERAL IV ACCESS   . PERIPHERAL IV DRESSING CHANGE   . PATIENT CLASS/LEVEL OF CARE DESIGNATION   . PERIPHERAL VENOUS DUPLEX - LOWER   . morphine 4 mg/mL injection   . cefepime (MAXIPIME) 2 g in NS 100 mL IVPB minibag   . metroNIDAZOLE (FLAGYL) 500 mg in NS 100 mL premix IVPB   . LR bolus infusion 1,000 mL   . iopamidol (ISOVUE-370) 76% infusion   . heparin 5,000 units/mL initial IV BOLUS   . heparin 25,000 units in NS 250 mL infusion   . NS flush syringe   . NS flush syringe   . NS 250 mL flush bag   . D5W 250 mL flush bag   . electrolyte-A (PLASMALYTE-A) premix infusion   . gabapentin (NEURONTIN) capsule   . atorvastatin (LIPITOR) tablet   . elvitegravir-cobicistat-emtricitrabine-tenofovir (GENVOYA) 150-150-200-10 mg per tablet   . amLODIPine (NORVASC) tablet   . levothyroxine (SYNTHROID) tablet   . pantoprazole (PROTONIX) delayed release tablet   .  insulin glargine-yfgn 100 units/mL injection   . SSIP insulin lispro 100 units/mL injection   . insulin lispro (HUMALOG) 100 units/mL injection   . polyethylene glycol (MIRALAX) oral packet   . docusate sodium (COLACE) capsule   . oxyCODONE (ROXICODONE) immediate releaste tablet        Results for orders placed or performed during the hospital encounter of 03/30/21 (from the past 12 hour(s))   BASIC METABOLIC PANEL   Result Value Ref Range    SODIUM 133 (L) 136 - 145 mmol/L    POTASSIUM 4.1 3.5 - 5.1 mmol/L    CHLORIDE 98 96 - 111 mmol/L    CO2 TOTAL 22 (L) 23 - 31 mmol/L    ANION GAP 13 4 - 13 mmol/L    CALCIUM 9.4 8.8 - 10.2 mg/dL    GLUCOSE 158 (H) 65 - 125 mg/dL    BUN 20 8 - 25 mg/dL    CREATININE 1.23 0.75 - 1.35 mg/dL    BUN/CREA RATIO 16 6 - 22    ESTIMATED GFR 61 >=60 mL/min/BSA   HEPATIC FUNCTION PANEL   Result Value Ref Range    ALBUMIN 2.8 (L) 3.4 - 4.8  g/dL     ALKALINE PHOSPHATASE 104 45 - 115 U/L    ALT (SGPT) 38 10 - 55 U/L    AST (SGOT)  26 8 - 45 U/L    BILIRUBIN TOTAL 0.3 0.3 - 1.3 mg/dL    BILIRUBIN DIRECT 0.2 0.1 - 0.4 mg/dL    PROTEIN TOTAL 6.7 6.0 - 8.0 g/dL   LIPASE   Result Value Ref Range    LIPASE 15 10 - 60 U/L   PT/INR   Result Value Ref Range    PROTHROMBIN TIME 12.4 9.1 - 13.9 seconds    INR 1.08 0.80 - 1.20   PTT (PARTIAL THROMBOPLASTIN TIME)   Result Value Ref Range    APTT 26.7 24.2 - 37.5 seconds   TYPE AND SCREEN   Result Value Ref Range    UNITS ORDERED NOT STATED     ABO/RH(D) A NEGATIVE     ANTIBODY SCREEN NEGATIVE     SPECIMEN EXPIRATION DATE 04/02/2021,2359    VENOUS BLOOD GAS WITH LACTATE REFLEX   Result Value Ref Range    %FIO2 (VENOUS) 21.0 %    PH (VENOUS) 7.46 (H) 7.31 - 7.41    PCO2 (VENOUS) 38 (L) 41 - 51 mm/Hg    PO2 (VENOUS) 27 (L) 35 - 50 mm/Hg    BASE EXCESS 3.1 (H) -3.0 - 3.0 mmol/L    BICARBONATE (VENOUS) 26.2 (H) 22.0 - 26.0 mmol/L    LACTATE 3.0 (H) 0.0 - 1.3 mmol/L   HEPATITIS C ANTIBODY SCREEN WITH REFLEX TO HCV PCR   Result Value Ref Range    HCV ANTIBODY QUALITATIVE Negative Negative   CBC WITH DIFF   Result Value Ref Range    WBC 17.8 (H) 3.7 - 11.0 x10^3/uL    RBC 3.60 (L) 4.50 - 6.10 x10^6/uL    HGB 11.3 (L) 13.4 - 17.5 g/dL    HCT 34.1 (L) 38.9 - 52.0 %    MCV 94.7 78.0 - 100.0 fL    MCH 31.4 26.0 - 32.0 pg    MCHC 33.1 31.0 - 35.5 g/dL    RDW-CV 17.0 (H) 11.5 - 15.5 %    PLATELETS 398 150 - 400 x10^3/uL    MPV 10.2 8.7 - 12.5 fL   MANUAL DIFF AND MORPHOLOGY-SYSMEX   Result  Value Ref Range    NEUTROPHIL % 89 %    LYMPHOCYTE %  7 %    MONOCYTE % 2 %    EOSINOPHIL % 0 %    BASOPHIL % 0 %    METAMYELOCYTE %  3 %    NEUTROPHIL # 15.84 (H) 1.50 - 7.70 x10^3/uL    LYMPHOCYTE # 1.25 1.00 - 4.80 x10^3/uL    MONOCYTE # 0.36 0.20 - 1.10 x10^3/uL    EOSINOPHIL # <0.10 <=0.50 x10^3/uL    BASOPHIL # <0.10 <=0.20 x10^3/uL    NRBC FROM MANUAL DIFF 4 per 100 WBC    PLATELET CLUMPS Present (A) None    TOXIC GRANULATION Present (A)  None   DARK GREEN TUBE   Result Value Ref Range    RAINBOW/EXTRA TUBE AUTO RESULT Yes    LACTIC ACID TIMED   Result Value Ref Range    LACTIC ACID 1.2 0.5 - 2.2 mmol/L   COVID-19 SCREENING - Placement - NON-PUI   Result Value Ref Range    SARS-CoV-2 Not Detected Not Detected    INFLUENZA VIRUS TYPE A Not Detected Not Detected    INFLUENZA VIRUS TYPE B Not Detected Not Detected    RESPIRATORY SYNCTIAL VIRUS (RSV) Not Detected Not Detected      Oren Binet, MD  03/31/2021, 05:32

## 2021-03-31 NOTE — Nurses Notes (Signed)
0440: Temp: 38.5c. patient asymptomatic, Dr.White paged and notified, Will continue to monitor.

## 2021-03-31 NOTE — Care Plan (Signed)
Problem: Adult Inpatient Plan of Care  Goal: Plan of Care Review  Outcome: Ongoing (see interventions/notes)  Goal: Patient-Specific Goal (Individualized)  Outcome: Ongoing (see interventions/notes)  Goal: Absence of Hospital-Acquired Illness or Injury  Outcome: Ongoing (see interventions/notes)  Intervention: Identify and Manage Fall Risk  Recent Flowsheet Documentation  Taken 03/31/2021 0845 by Marjorie Smolder, RN  Safety Promotion/Fall Prevention:   activity supervised   fall prevention program maintained   nonskid shoes/slippers when out of bed   safety round/check completed  Intervention: Prevent Skin Injury  Recent Flowsheet Documentation  Taken 03/31/2021 0845 by Marjorie Smolder, RN  Body Position: supine, head elevated  Skin Protection:   adhesive use limited   transparent dressing maintained   tubing/devices free from skin contact  Intervention: Prevent and Manage VTE (Venous Thromboembolism) Risk  Recent Flowsheet Documentation  Taken 03/31/2021 0845 by Marjorie Smolder, RN  VTE Prevention/Management:   ambulation promoted   anticoagulant therapy maintained  Intervention: Prevent Infection  Recent Flowsheet Documentation  Taken 03/31/2021 0845 by Marjorie Smolder, RN  Infection Prevention:   promote handwashing   rest/sleep promoted  Goal: Optimal Comfort and Wellbeing  Outcome: Ongoing (see interventions/notes)  Intervention: Provide Person-Centered Care  Recent Flowsheet Documentation  Taken 03/31/2021 0845 by Marjorie Smolder, [place] Relationship/Rapport:   care explained   questions answered   thoughts/feelings acknowledged  Goal: Rounds/Family Conference  Outcome: Ongoing (see interventions/notes)     Problem: Skin Injury Risk Increased  Goal: Skin Health and Integrity  Outcome: Ongoing (see interventions/notes)  Intervention: Optimize Skin Protection  Recent Flowsheet Documentation  Taken 03/31/2021 0845 by Marjorie Smolder, RN  Pressure Reduction Techniques: frequent weight shift encouraged  Pressure Reduction Devices:  (M,H,VH) Use Repositioning Devices or Pillows  Skin Protection:   adhesive use limited   transparent dressing maintained   tubing/devices free from skin contact  Activity Management:   activity adjusted per tolerance   activity encouraged  Head of Bed (HOB) Positioning: HOB at 30-45 degrees     Problem: Fall Injury Risk  Goal: Absence of Fall and Fall-Related Injury  Outcome: Ongoing (see interventions/notes)  Intervention: Promote Injury-Free Environment  Recent Flowsheet Documentation  Taken 03/31/2021 0845 by Marjorie Smolder, RN  Safety Promotion/Fall Prevention:   activity supervised   fall prevention program maintained   nonskid shoes/slippers when out of bed   safety round/check completed     Problem: Pain Acute  Goal: Optimal Pain Control and Function  Outcome: Ongoing (see interventions/notes)

## 2021-03-31 NOTE — ED Nurses Note (Signed)
Verbal report called to RN on 9E at this time.

## 2021-03-31 NOTE — Care Management Notes (Signed)
[place] Management Initial Evaluation    Patient Name: [name]  Date of Birth: 1946-01-25  Sex: male  Date/Time of Admission: 03/30/2021 10:08 PM  Room/Bed: 979/A  Payor: [place] CCN COMMUNITY CARE / Plan: Jolayne Panther VACCN/OPTUM / Product Type: Managed Care /   Primary Care Providers:  [place] (General)    Pharmacy Info:   Preferred Pharmacy       [id number]    [address] [place] [address]    Phone: [phone] Fax: [phone]    Hours: Not open 24 hours    [place], [place] - [address]    [address] [place] [address]    Phone: [phone] Fax: [phone]    Hours: Not open 24 hours    [place]    [place] [address]    Phone: [phone] Fax: [phone]    Hours: 24/7          Emergency Contact Info:   Extended Emergency Contact Information  Primary Emergency Contact: Maxey Ransom  Mobile Phone: [phone]  Relation: Wife  Preferred language: English  Interpreter needed? No    History:   [name] is a 76 y.o., male    Height/Weight: 175.3 cm (5\' 9" ) / 76.1 kg (167 lb 12.3 oz)     LOS: 0 days   Admitting Diagnosis: Pulmonary embolism (CMS [facility]) [I26.99]    Assessment:      03/31/21 1236   Assessment Details   Assessment Type Admission   Date of Care Management Update 03/31/21   Date of Next DCP Update 04/02/21   Readmission   Is this a readmission? No   Insurance Information/Type   Insurance type Other (see comments)   Comment (Other Insurance): [facility]   Employment/Financial   Patient has Prescription Coverage?  Yes        Name of Insurance Coverage for Medications [facility]   Financial Concerns none   Living Environment   Select an age group to open "lives with" row.  Adult   Lives With spouse   Living Arrangements house   Able to Return to Prior Arrangements yes   Home Safety   Home Assessment: No Problems  Identified   Home Accessibility no concerns   Care Management Plan   Discharge Planning Status initial meeting   Projected Discharge Date 04/04/21   Discharge plan discussed with: Patient;Spouse   CM will evaluate for rehabilitation potential yes   Discharge Needs Assessment   Equipment Currently Used at Home none   Equipment Needed After Discharge none   Discharge Facility/Level of Care Needs Home vs Home with Home Health   Transportation Available family or friend will provide   Referral Information   Admission Type inpatient   Address Verified verified-no changes   Arrived From home or self-care   ADVANCE DIRECTIVES   Does the Patient have an Advance Directive? Yes, Patient Does Have Advance Directive for Healthcare Treatment   Type of Advance Directive Completed Medical Power of Attorney   Copy of Advance Directives in Chart? 0   Name of [place]   Phone Number of [place] or Healthcare Surrogate [phone]   LAY CAREGIVER    Appointed Lay Caregiver? I Decline         Discharge Plan:  Home vs home with Home Health  Lives with spouse in one story house, spouse to provide transport at d/c.  Denies HH.  Has the following DMW available:  FWW, w/c, straight cane, shower bench; however currently not utilizing any DME.  [place] insurance and uses [facility] for prescriptions.  All other care provided through [facility].  Active with PCP, last office visit three months ago.  [place] called [facility] admit report number [phone] @ 9:43AM this morning authorization # [id number].  PT consulted, pending.  [place] anticipates d/c disposition home vs home health, will continue to assess for d/c needs.    The patient will continue to be evaluated for developing discharge needs.     Case Manager: Kerry Dory, [place]  Phone: [phone]

## 2021-03-31 NOTE — Progress Notes (Signed)
[facility]  Surgical Oncology Progress Note      Jerl, Munyan, 76 y.o. male  Date of Birth:  01/03/1946  Date of Admission:  03/30/2021  Date of service: 03/31/2021    Assessment:  This is a 76 y.o. 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 Who is admitted for rigors and abdominal pain, who was found to have right segmental lower lobe PE.    Plan/Recommendations:  - Thrombotic protocol heparin gtt   - Diet: Clears  - IVF: 75 cc/hr  - Pain control: Tylenol, robaxin  - Labs: Leukocytosis 17.8, Repeat VBG at 1200  - Imaging: CT C/A/P Right PE  - Nursing: OOB, IS  - Ppx   - GI: protonix       - DVT: heparin gtt  - Activity: OOB  - Dispo: Continue stepdown care    Subjective:   Denies any SOB, pain around JP drain site.     Objective:  Vitals:   Filed Vitals:    03/31/21 0300 03/31/21 0400 03/31/21 0430 03/31/21 0448   BP: 138/84 (!) 142/80 136/79    Pulse: (!) 118 (!) 114 (!) 118    Resp: (!) 21 20 20     Temp:   (!) 38.5 C (101.3 F) (!) 38.2 C (100.8 F)   SpO2: 93% 92% 92%      Constitutional: Pleasant, NAD  HEENT: Normocephalic, atraumatic. No scleral icterus.   Neck: Trachea midline.   Cardiovascular: RRR. No peripheral edema.  Respiratory: Normal effort, chest expands symmetrically.   GI: Abdomen soft and supple; mild ttp in LLQ around drain site, non-distended. JP with tan output  Musculoskeletal: Moving all extremities.  Integumentary: Pink, warm, and dry.  Neurologic: Alert and oriented. No focal motor or sensory deficits  Psychiatric: Speech pattern and mood/affect normal.    Labs  Results for orders placed or performed during the hospital encounter of 03/30/21 (from the past 24 hour(s))   BASIC METABOLIC PANEL   Result Value Ref Range    SODIUM 133 (L) 136 - 145 mmol/L    POTASSIUM 4.1 3.5 - 5.1 mmol/L    CHLORIDE 98 96 - 111 mmol/L    CO2 TOTAL 22 (L) 23 - 31 mmol/L    ANION GAP 13 4 - 13 mmol/L    CALCIUM 9.4 8.8 - 10.2 mg/dL     GLUCOSE 158 (H) 65 - 125 mg/dL    BUN 20 8 - 25 mg/dL    CREATININE 1.23 0.75 - 1.35 mg/dL    BUN/CREA RATIO 16 6 - 22    ESTIMATED GFR 61 >=60 mL/min/BSA   CBC/DIFF    Narrative    The following orders were created for panel order CBC/DIFF.  Procedure                               Abnormality         Status                     ---------                               -----------         ------                     CBC WITH  QTMA[[id number]]                Abnormal            Final result               MANUAL DIFF AND MORPHOLO.Marland KitchenMarland Kitchen[[id number]]  Abnormal            Final result                 Please view results for these tests on the individual orders.   HEPATIC FUNCTION PANEL   Result Value Ref Range    ALBUMIN 2.8 (L) 3.4 - 4.8 g/dL     ALKALINE PHOSPHATASE 104 45 - 115 U/L    ALT (SGPT) 38 10 - 55 U/L    AST (SGOT)  26 8 - 45 U/L    BILIRUBIN TOTAL 0.3 0.3 - 1.3 mg/dL    BILIRUBIN DIRECT 0.2 0.1 - 0.4 mg/dL    PROTEIN TOTAL 6.7 6.0 - 8.0 g/dL   LIPASE   Result Value Ref Range    LIPASE 15 10 - 60 U/L   PT/INR   Result Value Ref Range    PROTHROMBIN TIME 12.4 9.1 - 13.9 seconds    INR 1.08 0.80 - 1.20    Narrative    Coumadin therapy INR range for Conventional Anticoagulation is 2.0 to 3.0 and for Intensive Anticoagulation 2.5 to 3.5.   PTT (PARTIAL THROMBOPLASTIN TIME)   Result Value Ref Range    APTT 26.7 24.2 - 37.5 seconds    Narrative    Therapeutic range for unfractionated heparin is 60-100 seconds.   VENOUS BLOOD GAS WITH LACTATE REFLEX   Result Value Ref Range    %FIO2 (VENOUS) 21.0 %    PH (VENOUS) 7.46 (H) 7.31 - 7.41    PCO2 (VENOUS) 38 (L) 41 - 51 mm/Hg    PO2 (VENOUS) 27 (L) 35 - 50 mm/Hg    BASE EXCESS 3.1 (H) -3.0 - 3.0 mmol/L    BICARBONATE (VENOUS) 26.2 (H) 22.0 - 26.0 mmol/L    LACTATE 3.0 (H) 0.0 - 1.3 mmol/L   HEPATITIS C ANTIBODY SCREEN WITH REFLEX TO HCV PCR   Result Value Ref Range    HCV ANTIBODY QUALITATIVE Negative Negative   CBC WITH DIFF   Result Value Ref Range    WBC 17.8 (H) 3.7 - 11.0  x103/uL    RBC 3.60 (L) 4.50 - 6.10 x106/uL    HGB 11.3 (L) 13.4 - 17.5 g/dL    HCT 34.1 (L) 38.9 - 52.0 %    MCV 94.7 78.0 - 100.0 fL    MCH 31.4 26.0 - 32.0 pg    MCHC 33.1 31.0 - 35.5 g/dL    RDW-CV 17.0 (H) 11.5 - 15.5 %    PLATELETS 398 150 - 400 x103/uL    MPV 10.2 8.7 - 12.5 fL   EXTRA TUBES    Narrative    The following orders were created for panel order EXTRA TUBES.  Procedure                               Abnormality         Status                     ---------                               -----------         ------  DARK GREEN YCXK[[id number]]                                  Final result                 Please view results for these tests on the individual orders.   DARK GREEN TUBE   Result Value Ref Range    RAINBOW/EXTRA TUBE AUTO RESULT Yes    LACTIC ACID TIMED   Result Value Ref Range    LACTIC ACID 1.2 0.5 - 2.2 mmol/L   MANUAL DIFF AND MORPHOLOGY-SYSMEX   Result Value Ref Range    NEUTROPHIL % 89 %    LYMPHOCYTE %  7 %    MONOCYTE % 2 %    EOSINOPHIL % 0 %    BASOPHIL % 0 %    METAMYELOCYTE %  3 %    NEUTROPHIL # 15.84 (H) 1.50 - 7.70 x103/uL    LYMPHOCYTE # 1.25 1.00 - 4.80 x103/uL    MONOCYTE # 0.36 0.20 - 1.10 x103/uL    EOSINOPHIL # <0.10 <=0.50 x103/uL    BASOPHIL # <0.10 <=0.20 x103/uL    NRBC FROM MANUAL DIFF 4 per 100 WBC    PLATELET CLUMPS Present (A) None    TOXIC GRANULATION Present (A) None   COVID-19 SCREENING - Placement - NON-PUI   Result Value Ref Range    SARS-CoV-2 Not Detected Not Detected    INFLUENZA VIRUS TYPE A Not Detected Not Detected    INFLUENZA VIRUS TYPE B Not Detected Not Detected    RESPIRATORY SYNCTIAL VIRUS (RSV) Not Detected Not Detected    Narrative    Results are for the simultaneous qualitative identification of SARS-CoV-2 (formerly 2019-nCoV), Influenza A, Influenza B, and RSV RNA. These etiologic agents are generally detectable in nasopharyngeal and nasal swabs during the ACUTE PHASE of infection. Hence, this test is intended to be  performed on respiratory specimens collected from individuals with signs and symptoms of upper respiratory tract infection who meet Centers for Disease Control and Prevention (CDC) clinical and/or epidemiological criteria for Coronavirus Disease 2019 (COVID-19) testing. CDC COVID-19 criteria for testing on human specimens is available at [facility] webpage information for Healthcare Professionals: Coronavirus Disease 2019 (COVID-19) ([email or web address]).     False-negative results may occur if the virus has genomic mutations, insertions, deletions, or rearrangements or if performed very early in the course of illness. Otherwise, negative results indicate virus specific RNA targets are not detected, however negative results do not preclude SARS-CoV-2 infection/COVID-19, Influenza, or Respiratory syncytial virus infection. Results should not be used as the sole basis for patient management decisions. Negative results must be combined with clinical observations, patient history, and epidemiological information. If upper respiratory tract infection is still suspected based on exposure history together with other clinical findings, re-testing should be considered.    Disclaimer:   This assay has been authorized by FDA under an Emergency Use Authorization for use in laboratories certified under the Clinical Laboratory Improvement Amendments of 1988 (CLIA), 42 U.S.C. [phone], to perform high complexity tests. The impacts of vaccines, antiviral therapeutics, antibiotics, chemotherapeutic or immunosuppressant drugs have not been evaluated.     Test methodology:   Cepheid Xpert Xpress SARS-CoV-2/Flu/RSV Assay real-time polymerase chain reaction (RT-PCR) test on the GeneXpert Dx and Xpert Xpress systems.   TYPE AND SCREEN   Result Value Ref Range    UNITS ORDERED NOT STATED  ABO/RH(D) A NEGATIVE     ANTIBODY SCREEN NEGATIVE     SPECIMEN EXPIRATION DATE 04/02/2021,2359    POC BLOOD GLUCOSE  (RESULTS)   Result Value Ref Range    GLUCOSE, POC 137 (H) 70 - 105 mg/dl       I/O:    Intake/Output Summary (Last 24 hours) at 03/31/2021 0837  Last data filed at 03/31/2021 0657  Gross per 24 hour   Intake 1837.6 ml   Output --   Net 1837.6 ml        Current Medications:  Current Facility-Administered Medications   Medication Dose Route Frequency    amLODIPine (NORVASC) tablet  5 mg Oral Daily    atorvastatin (LIPITOR) tablet  40 mg Oral Daily    D5W 250 mL flush bag   Intravenous Q15 Min PRN    docusate sodium (COLACE) capsule  100 mg Oral 2x/day    electrolyte-A (PLASMALYTE-A) premix infusion   Intravenous Continuous    elvitegravir-cobicistat-emtricitrabine-tenofovir (GENVOYA) 150-150-200-10 mg per tablet  1 Tablet Oral Daily    gabapentin (NEURONTIN) capsule  300 mg Oral 2x/day    heparin 25,000 units in NS 250 mL infusion  18 Units/kg/hr (Adjusted) Intravenous Continuous    insulin glargine-yfgn 100 units/mL injection  7 Units Subcutaneous NIGHTLY    [Held by provider] insulin lispro (HUMALOG) 100 units/mL injection  4 Units Subcutaneous 3x/day AC    levothyroxine (SYNTHROID) tablet  75 mcg Oral QAM    NS 250 mL flush bag   Intravenous Q15 Min PRN    NS flush syringe  2-6 mL Intracatheter Q8HRS    NS flush syringe  2-6 mL Intracatheter Q1 MIN PRN    pantoprazole (PROTONIX) delayed release tablet  40 mg Oral 2x/day    polyethylene glycol (MIRALAX) oral packet  17 g Oral 2x/day    SSIP insulin lispro 100 units/mL injection  0-12 Units Subcutaneous Q4H PRN     Allene Pyo, PA-C  03/31/2021, 08:37    I personally saw and examined the patient. See physician's assistant note for additional details.     Lynwood Dawley, MD

## 2021-03-31 NOTE — Pharmacy (Signed)
Pharmacy Medication Reconciliation    Patient Name: [name], [name]  Date of Service: 03/31/2021  Date of Admission: 03/30/2021  Date of Birth: 10/01/1945  Length of Stay:   0 days   Service: SURG ONCOLOGY      Transitions of Care:  Discharge Pharmacy services were discussed with the patient and the Meds to [facility] flowsheet and preferred pharmacy information were updated in EMR if applicable and able to assess with patient.    Information was collected from:  Patient, Pharmacy, and Caregiver  Spoke with [name] (Wife) [phone] who was at patients bedside. [name] has patient give history and also adds to history as well.    [id number] [address] [address] Phone: [phone]: [address]  [address]  [place] Phone: [phone]: [address] [address] [address] Phone: [phone]: [phone]     Clarified Prior to Admission Medications:  Prior to Admission medications    Medication Sig Taking Resumed Y/N (RPh) Comments   acetaminophen (TYLENOL) 500 mg Oral Tablet Take 2 Tablets (1,000 mg total) by mouth Every 4 hours as needed for Pain Yes  yes  Wife reports patient takes in between oxycodone doses.     amLODIPine (NORVASC) 5 mg Oral Tablet Take 1 Tablet (5 mg total) by mouth Once a day Yes  yes         atorvastatin (LIPITOR) 80 mg Oral Tablet Take 0.5 Tablets (40 mg total) by mouth Every morning with breakfast Yes  yes           BIOTIN ORAL Take 1 Tablet by mouth Once a day Yes  no         Blood Sugar Diagnostic (ACCU-CHEK GUIDE TEST STRIPS) Strip 1 Strip Four times a day - before meals and bedtime   --           cetirizine (ZYRTEC) 10 mg Oral Tablet Take 1 Tablet (10 mg total) by mouth Once a day Yes  no         cyanocobalamin (VITAMIN B 12) 1,000 mcg Oral Tablet Take 1  Tablet (1,000 mcg total) by mouth Every morning Yes  no           elviteg-cob-emtri-tenof ALAFEN (GENVOYA) 150-150-200-10 mg Oral Tablet Take 1 Tablet by mouth Once a day Yes  yes             flash glucose scanning reader (FREESTYLE LIBRE 2 READER) Does not apply Misc Use as directed with FreeStyle Libre 2 sensors   --             flash glucose sensor (FREESTYLE LIBRE 2 SENSOR) Does not apply Kit To check blood glucose continuously. Change every 14 days   --             gabapentin (NEURONTIN) 300 mg Oral Capsule Take 1 Capsule (300 mg total) by mouth Three times a day Yes  yes  [facility] capsules. Currently new script on hold never filled.     insulin aspart U-100 (NOVOLOG FLEXPEN U-100 INSULIN) 100 unit/mL (3 mL) Subcutaneous Insulin Pen Inject 4 Units under the skin Three times a day With meals Yes  yes  insulin glargine (LANTUS SOLOSTAR U-100 INSULIN) 100 unit/mL Subcutaneous Insulin Pen Inject 15 units under the skin daily  Patient taking differently: Inject 15 Units under the skin Every night Yes  yes  Patient states taking as highlighted in purple text.         --  Patient not taking.             --  Patient not taking.               --  Patient not taking.               --  Patient not taking.             --  Patient not taking.           insulin syr/ndl U100 half mark 0.3 mL 31 gauge x 5/16" Syringe Use a new syringe for each injection.   --           lancets (FREESTYLE LANCETS) 28 gauge Misc Use to test blood sugar 4 times a day - before meals and bedtime   --           levothyroxine (SYNTHROID) 88 mcg Oral Tablet Take 1 Tablet (88 mcg total) by mouth Every morning Yes  yes (75 mcg)           MELATONIN ORAL Take 1 Tablet by mouth Every night Yes  no         MULTIVITAMIN ORAL Take 1 Tablet by mouth Once a day Yes  no         oxyCODONE (ROXICODONE) 5 mg Oral Tablet Take 1 Tablet (5 mg total) by mouth Every 6 hours as needed for Pain for up to 7 days Yes  no  Last  filled 03/25/21 for #20 tablets. Wife reports patient has been needing every 6 hours.     pantoprazole (PROTONIX) 40 mg Oral Tablet, Delayed Release (E.C.) Take 1 Tablet (40 mg total) by mouth Twice daily 30 minutes before a meal Yes  yes           Pen Needle, Disposable, (BD NANO 2ND GEN PEN NEEDLE) 32 gauge x 5/32" Needle Use a new pen needle for each injection.   --             POTASSIUM ORAL Take 1 Tablet by mouth Once a day Yes  no  OTC.       Did patient's home medication list require updates? Yes    Medications UPDATED on Prior to Admission Med List:  -atorvastatin (LIPITOR) 80 mg Oral Tablet  to Take 0.5 Tablets (40 mg total) by mouth Every morning with breakfast    -BIOTIN ORAL - added sig    -elviteg-cob-emtri-tenof ALAFEN (GENVOYA) 150-150-200-10 mg Oral  to Take 1 Tablet by mouth Once a day    -gabapentin (NEURONTIN) 300 mg Oral Capsule  to Take 1 Capsule (300 mg total) by mouth Three times a day    -levothyroxine (SYNTHROID) 88 mcg Oral Tablet by mouth Every morning to Take 1 Tablet (88 mcg total)     - to MELATONIN ORAL Take 1 Tablet by mouth Every night    -MULTIVITAMIN ORAL - added sig    -POTASSIUM ORAL  - added sig    Medications ADDED to Prior to Admission Med List:  -acetaminophen (TYLENOL) 500 mg Oral Tablet  -cyanocobalamin (VITAMIN B 12) 1,000 mcg Oral Tablet    Allergies:    No Known Allergies  Irven Easterly, Pharmacy Technician    Did pharmacist make suggestions for medication reconciliation? Yes    Medications REMOVED from home medication list:  - duplicate insulin prescriptions    Pharmacist Recommendations:  - increase synthroid dose from 75 mcg to 88 mcg daily     Harrel Carina, PHARMD  03/31/2021, 14:08

## 2021-03-31 NOTE — ED Resident Handoff Note (Signed)
Emergency Department  Resident Course Note    Patient Name: [name]  Age and Gender: 76 y.o. male  Date of Birth: May 25, 1945  Date of Service: 03/31/2021  PCP: [facility]  Attending: Weston Brass, MD;[place]*    After a thorough discussion of the patient including presentation, ED course, and review of above information I have assumed care of [name] from Dr. Tomasa Hose at [facility] 03/31/2021    Triage Summary:   Post-Op Problem (Pt states had half his spleen and pancreas removed a week ago.  Pt states has a drainage tube to his Lt abdomen.  Pt states tonight started having increased pain.  Pt also had an episode of tremors around 1700 tonight)      HPI:  In brief, patient is a 76 y.o. 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 Given new O2 and subsegmental PE, gen surg evaluated patient and agree with admission to their service for further evaluation and management at this time.     Clinical Impression:     Clinical Impression   Pulmonary embolism, unspecified chronicity, unspecified pulmonary embolism type, unspecified whether acute cor pulmonale present (CMS HCC) (Primary)       Disposition: Admitted    Patient will be admitted to General Surgery service for further evaluation and management.      Follow up:   No follow-up provider specified.    Allergies: No Known Allergies    Pertinent Imaging/Lab results:  Labs Ordered/Reviewed   BASIC METABOLIC PANEL - Abnormal; Notable for the following components:       Result Value     SODIUM 133 (*)     CO2 TOTAL 22 (*)     GLUCOSE 158 (*)     All other components within normal limits   HEPATIC FUNCTION PANEL - Abnormal; Notable for the following components:    ALBUMIN 2.8 (*)     All other components within normal limits   VENOUS BLOOD GAS WITH LACTATE REFLEX - Abnormal; Notable for the following components:    PH (VENOUS) 7.46 (*)     PCO2 (VENOUS) 38 (*)     PO2 (VENOUS) 27 (*)     BASE EXCESS 3.1 (*)     BICARBONATE (VENOUS) 26.2 (*)     LACTATE 3.0 (*)     All other components within normal limits   CBC WITH DIFF - Abnormal; Notable for the following components:    WBC 17.8 (*)     RBC 3.60 (*)     HGB 11.3 (*)     HCT 34.1 (*)     RDW-CV 17.0 (*)     All other components within normal limits   MANUAL DIFF AND MORPHOLOGY-SYSMEX - Abnormal; Notable for the following components:    NEUTROPHIL # 15.84 (*)     PLATELET CLUMPS Present (*)     TOXIC GRANULATION Present (*)  All other components within normal limits   LIPASE - Normal   PT/INR - Normal    Narrative:     Coumadin therapy INR range for Conventional Anticoagulation is 2.0 to 3.0 and for Intensive Anticoagulation 2.5 to 3.5.   PTT (PARTIAL THROMBOPLASTIN TIME) - Normal    Narrative:     Therapeutic range for unfractionated heparin is 60-100 seconds.   HEPATITIS C ANTIBODY SCREEN WITH REFLEX TO HCV PCR - Normal   ADULT ROUTINE BLOOD CULTURE, SET OF 2 BOTTLES (BACTERIA AND YEAST)   ADULT ROUTINE BLOOD CULTURE, SET OF 2 BOTTLES (BACTERIA AND YEAST)   CBC/DIFF    Narrative:     The following orders were created for panel order CBC/DIFF.  Procedure                               Abnormality         Status                     ---------                               -----------         ------                     CBC WITH LYYT[[id number]]                Abnormal            Final result               MANUAL DIFF AND MORPHOLO.Marland KitchenMarland Kitchen[[id number]]  Abnormal            Final result                 Please view results for these tests on the individual  orders.   EXTRA TUBES    Narrative:     The following orders were created for panel order EXTRA TUBES.  Procedure                               Abnormality         Status                     ---------                               -----------         ------                     DARK GREEN CBSW[[id number]]                                  In process                   Please view results for these tests on the individual orders.   DARK GREEN TUBE   LACTIC ACID TIMED   COVID-19 [place] MOLECULAR LAB TESTING   TYPE AND SCREEN     Results for orders placed or performed during the hospital encounter of 03/30/21   CT CHEST ABDOMEN PELVIS W IV CONTRAST  Status: None    Narrative    CT CHEST ABDOMEN PELVIS W IV CONTRAST performed on 03/31/2021 12:09 AM    INDICATION: 76 years old Male  recent ex lap with splenectomy and partial pancreatectomy on 2/20 with worsening pain and drainage surrounding drain site. Hx of MRSA. ? developing abscess vs pancreatic abscess/pseudocyst; New O2 requirment, recent surgery and DVT hx    TECHNIQUE: Axial images from the thoracic inlet through the pelvis obtained after administration of IV contrast with reformatted coronal and sagittal images, utilizing soft tissue and lung algorithms    CONTRAST: 100 cc of Isovue 370    RADIATION DOSE: 542.11 mGy.cm    COMPARISON: CT abdomen pelvis 01/02/2021    FINDINGS:  CHEST:  Lungs: Mosaic attenuation is seen throughout the lung fields, likely related to expiratory imaging. Right greater than left basilar atelectasis. No pleural effusion or pneumothorax. Central airways are patent. No evidence of bronchiectasis. Limited evaluation for pulmonary nodules due to respiratory motion.    Heart/Mediastinum: No cardiomegaly, pericardial effusion, mediastinal mass, or suspicious mediastinal adenopathy. Aorta and main pulmonary artery are normal in diameter. There is a three-vessel aortic arch without significant atherosclerotic disease. Segmental pulmonary embolus  involving the right lower lobe (series 3 images 64-69) and right upper lobe (3-53-59). No CT evidence of right heart strain. Thyroid is homogenous.    ABDOMEN:  Liver: No hepatomegaly or steatosis. Hypodense lesion within the left hepatic lobe, likely benign cyst versus small hemangioma.    Gallbladder/Biliary System: Gallbladder is physiologically distended. No pericholecystic fluid. No biliary duct dilation.    Spleen: Postoperative changes from splenectomy and distal pancreatectomy. Mild inflammatory change within the resection bed without evidence of organized fluid collection. Surgical drain remains in place.    Pancreas: Postoperative changes from distal pancreatectomy. Mild inflammation and ill-defined fluid within the resection bed. No rim-enhancing organized fluid collection to suggest abscess.    Adrenals: Grossly unremarkable bilaterally.    Kidney/Ureters/Bladder: Kidneys are symmetric in enhancement with multiple bilateral simple renal cysts. No hydronephrosis. Ureters are of normal course and caliber. The bladder is distended with no visible wall thickening or perivesicular stranding.    Reproductive Organs: Prostate is normal in size.    Bowel: The bowel is nondilated. No evidence for obstruction. Large stool burden within the proximal colon. The stomach is partially distended with undigested contents.    Vasculature: The abdominal aorta is of normal caliber. There are scattered atherosclerotic calcifications throughout the included vasculature. Hepatic and portal veins are patent.    Peritoneal Cavity/Lymph Nodes: Trace free fluid along the pancreas resection cavity. Scattered areas of mesenteric inflammation along the exploratory laparotomy incision and within the left upper quadrant surgical bed. No evidence of an organized fluid collection. No pneumoperitoneum. No suspicious lymphadenopathy.    Soft Tissues/Bones: No soft tissue hematoma or large ventral abdominal wall hernia. Multilevel  degenerative changes of the spine. Exploratory laparotomy incision and surgical staples well aligned.        Impression    1.Right lower and upper lobe segmental pulmonary emboli without CT evidence of right heart strain.  2.Expected postoperative changes from splenectomy and distal pancreatectomy with trace fluid along the resection cavity of the mesentery. No evidence of organized fluid collection.  3.Large stool burden in the proximal colon.    Findings were discussed with Dr. Tomasa Hose of the emergency department by Dr. Tye Savoy at approximately 8.       / Jackquline Denmark, MD 03/31/2021, 00:03   PGY-3 Emergency Medicine  [place] [facility]  Pager # - [place] [facility]    *Parts of this patients chart were completed in a retrospective fashion due to simultaneous direct patient care activities in the Emergency Department.   *This note was partially generated using MModal Fluency Direct system, and there may be some incorrect words, spellings, and punctuation that were not noted in checking the note before saving.

## 2021-04-01 DIAGNOSIS — Z8546 Personal history of malignant neoplasm of prostate: Secondary | ICD-10-CM

## 2021-04-01 LAB — CBC WITH DIFF
BASOPHIL #: <0.1 10*3/uL (ref <=0.20)
BASOPHIL %: 0 %
EOSINOPHIL #: <0.1 10*3/uL (ref <=0.50)
EOSINOPHIL %: 0 %
HCT: 29.7 % — ABNORMAL LOW (ref 38.9–52.0)
HGB: 9.5 g/dL — ABNORMAL LOW (ref 13.4–17.5)
IMMATURE GRANULOCYTE #: 0.3 10*3/uL — ABNORMAL HIGH (ref <0.10)
IMMATURE GRANULOCYTE %: 2 % — ABNORMAL HIGH (ref 0–1)
LYMPHOCYTE #: 1.43 10*3/uL (ref 1.00–4.80)
LYMPHOCYTE %: 9 %
MCH: 30.8 pg (ref 26.0–32.0)
MCHC: 32 g/dL (ref 31.0–35.5)
MCV: 96.4 fL (ref 78.0–100.0)
MONOCYTE #: 0.7 10*3/uL (ref 0.20–1.10)
MONOCYTE %: 4 %
MPV: 10.3 fL (ref 8.7–12.5)
NEUTROPHIL #: 13.57 10*3/uL — ABNORMAL HIGH (ref 1.50–7.70)
NEUTROPHIL %: 85 %
PLATELETS: 384 10*3/uL (ref 150–400)
RBC: 3.08 10*6/uL — ABNORMAL LOW (ref 4.50–6.10)
RDW-CV: 16.7 % — ABNORMAL HIGH (ref 11.5–15.5)
WBC: 16.1 10*3/uL — ABNORMAL HIGH (ref 3.7–11.0)

## 2021-04-01 LAB — BASIC METABOLIC PANEL
ANION GAP: 4 mmol/L (ref 4–13)
BUN/CREA RATIO: 12 (ref 6–22)
BUN: 11 mg/dL (ref 8–25)
CALCIUM: 8.4 mg/dL — ABNORMAL LOW (ref 8.8–10.2)
CHLORIDE: 102 mmol/L (ref 96–111)
CO2 TOTAL: 30 mmol/L (ref 23–31)
CREATININE: 0.89 mg/dL (ref 0.75–1.35)
ESTIMATED GFR: 89 mL/min/BSA (ref >=60)
GLUCOSE: 102 mg/dL (ref 65–125)
POTASSIUM: 3.9 mmol/L (ref 3.5–5.1)
SODIUM: 136 mmol/L (ref 136–145)

## 2021-04-01 LAB — PHOSPHORUS: PHOSPHORUS: 2.7 mg/dL (ref 2.3–4.0)

## 2021-04-01 LAB — POC BLOOD GLUCOSE (RESULTS)
GLUCOSE, POC: 99 mg/dl (ref 70–105)
GLUCOSE, POC: 122 mg/dl — ABNORMAL HIGH (ref 70–105)
GLUCOSE, POC: 127 mg/dl — ABNORMAL HIGH (ref 70–105)
GLUCOSE, POC: 217 mg/dl — ABNORMAL HIGH (ref 70–105)

## 2021-04-01 LAB — PTT (PARTIAL THROMBOPLASTIN TIME): APTT: 69.7 seconds — ABNORMAL HIGH (ref 24.2–37.5)

## 2021-04-01 LAB — MAGNESIUM: MAGNESIUM: 2.2 mg/dL (ref 1.8–2.6)

## 2021-04-01 MED ORDER — LEVOTHYROXINE 88 MCG TABLET
88.0000 ug | ORAL_TABLET | Freq: Every morning | ORAL | Status: DC
Start: 2021-04-02 — End: 2021-04-02
  Administered 2021-04-02: 88 ug via ORAL
  Filled 2021-04-01: qty 1

## 2021-04-01 NOTE — Care Management Notes (Signed)
[facility]  Care Management Note    Patient Name: [name]  Date of Birth: [birth year]  Sex: male  Date/Time of Admission: 03/30/2021 10:08 PM  Room/Bed: 979/A  Payor: VETERANS ADMIN / Plan: VETERANS ADMINISTRATION / Product Type: Special Bill /    LOS: 1 day   Primary Care Providers:  [place] (General)    Admitting Diagnosis:  Pulmonary embolism (CMS [facility]) [I26.99]    Assessment:      04/01/21 1257   Assessment Details   Assessment Type Continued Assessment   Date of Care Management Update 04/01/21   Date of Next DCP Update 04/02/21   Care Management Plan   Discharge Planning Status plan in progress         Discharge Plan:  Home vs home with Home Health    Patient receives all prescriptions from [place].  CCC received call from [facility], Care in [place] X [phone].  Per [facility] all new prescriptions need to go through [place], also if patient discharges with Eliquis patient will need to receive a seven day prescription which is to go [facility] a [facility], call [facility] at number above and [place] will provide a code for medication, and will also need a additional prescription for Eliquis for after the seven days for the [place] to fill.  [place] sent message to service.  PT consulted, pending.CCC anticipates d/c dispo home vs home health, will continue to assess.    The patient will continue to be evaluated for developing discharge needs.     Case Manager: Kerry Dory, [place]  Phone: [phone]

## 2021-04-01 NOTE — Care Plan (Signed)
[place]  Physical Therapy Initial Evaluation    Patient Name: [name]  Date of Birth: 08-12-1945  Height: Height: 175.3 cm (5\' 9" )  Weight: Weight: 76.1 kg (167 lb 12.3 oz)  Room/Bed: 979/A  Payor: VETERANS ADMIN / Plan: VETERANS ADMINISTRATION / Product Type: Special Bill /     Assessment:      [name] was alert, pleasant, and cooperative with PT eval. He was independent with mobility at baseline but has not been very active recently. He is currently admitted for tx of PE. He presents with mild weakness and decreased activity tol but has adequate strength and function to allow for safe return to home at d/c. PT will cont to follow him while he is in acute care.    Discharge Needs:   Equipment Recommendation: none anticipated  Discharge Disposition: home with assist    Plan:   Current Intervention: balance training, bed mobility training, gait training, patient/family education, stair training, strengthening, transfer training  To provide physical therapy services 1x/day, minimum of 2x/week  for duration of until discharge.    The risks/benefits of therapy have been discussed with the patient/caregiver and he/she is in agreement with the established plan of care.       Subjective & Objective        04/01/21 1238   Therapist Pager   PT Assigned/ Pager # [name] [phone]   Rehab Session   Document Type evaluation   Total PT Minutes: 29   Patient Effort good   Symptoms Noted During/After Treatment fatigue;shortness of breath;other (see comments)   Symptoms Noted Comment weakness in his legs   General Information   Patient Profile Reviewed yes   Onset of Illness/Injury or Date of Surgery 03/30/21   Patient/Family/Caregiver Comments/Observations Pt was alert and agreeable to PT eval.   Pertinent History of Current Functional Problem [name] is a "76 y.o. 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 Who is admitted  for rigors and abdominal pain, who was found to have right segmental lower lobe PE."   Medical Lines Peripheral Drain;PIV Line;Telemetry  (pulse ox)   Respiratory Status room air   Existing Precautions/Restrictions fall precautions;full code;contact isolation   General Observations of Patient Pt was seen at b/s on 9e for PT eval with permission of b/s nurse.   Mutuality/Individual Preferences   Individualized Care Needs OOB with SBA x 1   Living Environment   Lives With spouse   Living Arrangements house   Home Accessibility stairs to enter home   [place] Pt lives with his wife in a single level home with 4 STE, L HR. Wife is able to help if needed.   Home Main Entrance   Stair Railings, Main Entrance railing on left side (ascending)   Number of Stairs, Main Entrance four   Functional Level Prior   Ambulation 0 - independent   Transferring 0 - independent   Toileting 0 - independent   Bathing 0 - independent   Dressing 0 - independent   Eating 0 - independent   Prior Functional Level Comment Pt was independent with mobility and ADLs at baseline. Initially stated that he plays golf 5-6 days/week but later stated that he has not been very active lately; spends a lot of his day in a recliner playing video games.   Self-Care   Current Activity Tolerance moderate   Equipment Currently Used at Home other (see  comments)  (has access to but does not use)   Equipment Currently Used at Home cane, straight;bath bench;walker, front wheeled;wheelchair   Pre Treatment Status   Pre Treatment Patient Status Patient sitting in bedside chair or w/c;Call light within reach;Telephone within reach   Support Present Pre Treatment  Nurse present   Communication Pre Treatment  Nurse   Cognitive Assessment/Interventions   Behavior/Mood Observations behavior appropriate to situation, WNL/WFL;alert;cooperative   Attention WNL/WFL   Follows Commands WNL   Vital Signs   Pre-Treatment Heart Rate (beats/min) 83   Post-treatment  Heart Rate (beats/min) 90   Pre-Treatment Resp Rate (breaths/min) 22   Post-treatment Resp Rate (breaths/min) 30   Pre SpO2 (%) 92   O2 Delivery Pre Treatment room air   Post SpO2 (%) 93   O2 Delivery Post Treatment room air   Pain Assessment   Pretreatment Pain Rating 0/10 - no pain   Posttreatment Pain Rating 0/10 - no pain   Pre/Posttreatment Pain Comment pt states he is "sore" at top of sx incision/abdomen but states he would not call it pain   RUE Assessment   RUE Assessment WFL- Within Functional Limits   LUE Assessment   LUE Assessment WFL- Within Functional Limits   RLE Assessment   RLE Assessment WFL- Within Functional Limits  (generalized weakness and fatigue with amb)   LLE Assessment   LLE Assessment WFL- Within Functional Limits  (generalized weakness and fatigue with amb)   Mobility Assessment/Training   Comment Pt donned pants unassisted. He then ambulated a lap around the unit. He took a lengthy standing rest break ~ midway due to SOB. He was slow to recover from SOB as he was talking as he stood. He reported feeling weak from his knees down during the last ~ 50 ft of amb. He was reclined in chair at b/s at end of eval.   Bed Mobility Assessment/Treatment   Comment NT as pt was up in chair at b/s at beginning and end of eval.   Transfer Assessment/Treatment   Sit-Stand Independence stand-by assistance   Stand-Sit Independence stand-by assistance   Sit-Stand-Sit, Assist Device None   Transfer Comment Pt was generally steady with transfers.   Gait Assessment/Treatment   Independence  stand-by assistance   Assistive Device    (none)   Distance in Feet 250 ft   Gait Speed mildly decreased but functional for household amb.   Deviations  cadence decreased;double stance time increased   Impairments  endurance;strength decreased   Comment Pt fatigued during amb with SOB and onset of LE weakness noted. No LOB with amb.   Balance Skill Training   Sitting Balance: Static good balance   Sitting, Dynamic  (Balance) fair + balance   Sit-to-Stand Balance fair balance   Standing Balance: Static fair balance   Standing Balance: Dynamic fair - balance   Systems Impairment Contributing to Balance Disturbance musculoskeletal   Identified Impairments Contributing to Balance Disturbance pain;decreased strength   Post Treatment Status   Post Treatment Patient Status Patient sitting in bedside chair or w/c;Call light within reach;Telephone within reach   Support Present Post Treatment  Nurse present   Communication [place] With patient   Basic Mobility Am-PAC/6Clicks Score (APPROVED PT Staff, [facility], [place], [place], [place], and FMT)   Turning in bed without bedrails 3   Lying on back to sitting on edge of flat bed 3  Moving to and from a bed to a chair 3   Standing up from chair 3   Walk in room 3   Climbing 3-5 steps with railing 3   6 Clicks Raw Score total 18   Standardized (t-scale) score 41.05   CMS 0-100% Score 40.47   CMS Modifier CK   Patient Mobility Goal (JHHLM) 6- Walk 10 steps or more 2X/day   Exercise/Activity Level Performed 8- Walked 250 feet or more   Physical Therapy Clinical Impression   Assessment [name] was alert, pleasant, and cooperative with PT eval. He was independent with mobility at baseline but has not been very active recently. He is currently admitted for tx of PE. He presents with mild weakness and decreased activity tol but has adequate strength and function to allow for safe return to home at d/c. PT will cont to follow him while he is in acute care.   Patient/Family Goals Statement none stated   Criteria for Skilled Therapeutic yes;meets criteria;skilled treatment is necessary   Pathology/Pathophysiology Noted musculoskeletal   Impairments Found (describe specific impairments) aerobic capacity/endurance;gait, locomotion, and balance;muscle performance   Functional Limitations in Following  community/leisure;home management    Disability: Inability to Perform community/leisure   Rehab Potential good   Therapy Frequency 1x/day;minimum of 2x/week   Predicted Duration of Therapy Intervention (days/wks) until discharge   Anticipated Equipment Needs at Discharge (PT) none anticipated   Anticipated Discharge Disposition home with assist   Evaluation Complexity Justification   Patient History: Co-morbidity/factors that impact Plan of Care 1-2 that impact Plan of Care;Surgical procedure: causing pain &/or impaired function;One or more other medical co-morbidity   Examination Components Range of motion;Strength;Balance;Transfers;Ambulation   Presentation Evolving: Symptoms, complaints, characteristics of condition changing &/or cognitive deficits present   Clinical Decision Making Low complexity   Evaluation Complexity Low complexity   Care Plan Goals   PT Rehab Goals Bed Mobility Goal;Gait Training Goal;Stairs Training Goal;Transfer Training Goal   Bed Mobility Goal   Bed Mobility Goal, Date Established 04/01/21   Bed Mobility Goal, Time to Achieve by discharge   Bed Mobility Goal, Activity Type roll left/roll right;supine to sit/sit to supine;sidelying to sit/sit to sidelying   Bed Mobility Goal, Independence Level independent   Bed Mobility Goal, Additional Goal HOB flat and no rail   Gait Training  Goal, Distance to Achieve   Gait Training  Goal, Date Established 04/01/21   Gait Training  Goal, Time to Achieve by discharge   Gait Training  Goal, Independence Level independent   Gait Training  Goal, Distance to Achieve 250 ft   Gait Training  Goal, Additional Goal no rest breaks   Stairs Training Goal   Stairs Training Goal, Date Established 04/01/21   Stairs Training Goal, Time to Achieve by discharge   Stairs Training Goal, Independence Level supervision required   Stairs Training Goal, Assist Device handrail, left   Stairs Training Goal, Number of Stairs to Achieve 4   Transfer Training Goal   Transfer Training Goal, Date Established  04/01/21   Transfer Training Goal, Time to Achieve by discharge   Transfer Training Goal, Activity Type bed-to-chair/chair-to-bed;sit-to-stand/stand-to-sit   Transfer Training Goal, Independence Level independent   Planned Therapy Interventions, PT Eval   Planned Therapy Interventions (PT) balance training;bed mobility training;gait training;patient/family education;stair training;strengthening;transfer training       Therapist:   Norlene Duel, PT   Pager #: [phone]

## 2021-04-01 NOTE — Progress Notes (Signed)
[facility]  Surgical Oncology Progress Note      Avyukth, Bontempo, 76 y.o. male  Date of Birth:  16-Sep-1945  Date of Admission:  03/30/2021  Date of service: 04/01/2021    Assessment:  This is a 76 y.o. 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 Who is admitted for rigors and abdominal pain, who was found to have right segmental lower lobe PE.    Plan/Recommendations:  - Thrombotic protocol heparin gtt    -Will transition to Eliquis tomorrow  - Diet: Diabetic diet  - IVF: discontinued   - Pain control: Tylenol, robaxin  - Labs: Leukocytosis 16.1  - Imaging: None  - Nursing: OOB, IS  - Ppx   - GI: protonix       - DVT: heparin gtt  - Activity: OOB  - Dispo: Continue stepdown care    Subjective:   NAEO. Pain well controlled. Tolerated clears without issue.      Objective:  Vitals:   Filed Vitals:    04/01/21 0315 04/01/21 0319 04/01/21 0330 04/01/21 0819   BP: 123/72      Pulse: 79      Resp: 20   20   Temp:  36.6 C (97.9 F)  36.7 C (98.1 F)   SpO2: (!) 88%  92%      Constitutional: Pleasant, NAD  HEENT: Normocephalic, atraumatic. No scleral icterus.   Neck: Trachea midline.   Cardiovascular: RRR. No peripheral edema.  Respiratory: Normal effort, chest expands symmetrically.   GI: Abdomen soft and supple; mild ttp in LLQ around drain site, non-distended. JP with tan output  Musculoskeletal: Moving all extremities.  Integumentary: Pink, warm, and dry.  Neurologic: Alert and oriented. No focal motor or sensory deficits  Psychiatric: Speech pattern and mood/affect normal.    Labs  Results for orders placed or performed during the hospital encounter of 03/30/21 (from the past 24 hour(s))   VENOUS BLOOD GAS WITH LACTATE REFLEX   Result Value Ref Range    %FIO2 (VENOUS) 32.0 %    PH (VENOUS) 7.40 7.31 - 7.41    PCO2 (VENOUS) 47 41 - 51 mm/Hg    PO2 (VENOUS) 49 35 - 50 mm/Hg    BASE EXCESS 3.5 (H) -3.0 - 3.0 mmol/L    BICARBONATE (VENOUS) 27.3 (H)  22.0 - 26.0 mmol/L    LACTATE 0.6 0.0 - 1.3 mmol/L   PTT (PARTIAL THROMBOPLASTIN TIME)   Result Value Ref Range    APTT 63.9 (H) 24.2 - 37.5 seconds    Narrative    Therapeutic range for unfractionated heparin is 60-100 seconds.   PTT (PARTIAL THROMBOPLASTIN TIME)   Result Value Ref Range    APTT 90.3 (H) 24.2 - 37.5 seconds    Narrative    Therapeutic range for unfractionated heparin is 60-100 seconds.   CBC/DIFF    Narrative    The following orders were created for panel order CBC/DIFF.  Procedure                               Abnormality         Status                     ---------                               -----------         ------  CBC WITH DIFF[[id number]]                Abnormal            Final result                 Please view results for these tests on the individual orders.   BASIC METABOLIC PANEL   Result Value Ref Range    SODIUM 136 136 - 145 mmol/L    POTASSIUM 3.9 3.5 - 5.1 mmol/L    CHLORIDE 102 96 - 111 mmol/L    CO2 TOTAL 30 23 - 31 mmol/L    ANION GAP 4 4 - 13 mmol/L    CALCIUM 8.4 (L) 8.8 - 10.2 mg/dL    GLUCOSE 102 65 - 125 mg/dL    BUN 11 8 - 25 mg/dL    CREATININE 0.89 0.75 - 1.35 mg/dL    BUN/CREA RATIO 12 6 - 22    ESTIMATED GFR 89 >=60 mL/min/BSA   PHOSPHORUS   Result Value Ref Range    PHOSPHORUS 2.7 2.3 - 4.0 mg/dL   MAGNESIUM   Result Value Ref Range    MAGNESIUM 2.2 1.8 - 2.6 mg/dL   PTT (PARTIAL THROMBOPLASTIN TIME) - ONCE   Result Value Ref Range    APTT 69.7 (H) 24.2 - 37.5 seconds    Narrative    Therapeutic range for unfractionated heparin is 60-100 seconds.   CBC WITH DIFF   Result Value Ref Range    WBC 16.1 (H) 3.7 - 11.0 x103/uL    RBC 3.08 (L) 4.50 - 6.10 x106/uL    HGB 9.5 (L) 13.4 - 17.5 g/dL    HCT 29.7 (L) 38.9 - 52.0 %    MCV 96.4 78.0 - 100.0 fL    MCH 30.8 26.0 - 32.0 pg    MCHC 32.0 31.0 - 35.5 g/dL    RDW-CV 16.7 (H) 11.5 - 15.5 %    PLATELETS 384 150 - 400 x103/uL    MPV 10.3 8.7 - 12.5 fL    NEUTROPHIL % 85 %    LYMPHOCYTE % 9 %     MONOCYTE % 4 %    EOSINOPHIL % 0 %    BASOPHIL % 0 %    NEUTROPHIL # 13.57 (H) 1.50 - 7.70 x103/uL    LYMPHOCYTE # 1.43 1.00 - 4.80 x103/uL    MONOCYTE # 0.70 0.20 - 1.10 x103/uL    EOSINOPHIL # <0.10 <=0.50 x103/uL    BASOPHIL # <0.10 <=0.20 x103/uL    IMMATURE GRANULOCYTE % 2 (H) 0 - 1 %    IMMATURE GRANULOCYTE # 0.30 (H) <0.10 x103/uL   POC BLOOD GLUCOSE (RESULTS)   Result Value Ref Range    GLUCOSE, POC 137 (H) 70 - 105 mg/dl   POC BLOOD GLUCOSE (RESULTS)   Result Value Ref Range    GLUCOSE, POC 136 (H) 70 - 105 mg/dl   POC BLOOD GLUCOSE (RESULTS)   Result Value Ref Range    GLUCOSE, POC 173 (H) 70 - 105 mg/dl   POC BLOOD GLUCOSE (RESULTS)   Result Value Ref Range    GLUCOSE, POC 99 70 - 105 mg/dl       I/O:    Intake/Output Summary (Last 24 hours) at 04/01/2021 0834  Last data filed at 04/01/2021 0820  Gross per 24 hour   Intake 270 ml   Output 2515 ml   Net -2245 ml        Current  Medications:  Current Facility-Administered Medications   Medication Dose Route Frequency    acetaminophen (TYLENOL) tablet  650 mg Oral Q4H PRN    amLODIPine (NORVASC) tablet  5 mg Oral Daily    atorvastatin (LIPITOR) tablet  40 mg Oral Daily    D5W 250 mL flush bag   Intravenous Q15 Min PRN    docusate sodium (COLACE) capsule  100 mg Oral 2x/day    elvitegravir-cobicistat-emtricitrabine-tenofovir (GENVOYA) 150-150-200-10 mg per tablet  1 Tablet Oral Daily    gabapentin (NEURONTIN) capsule  300 mg Oral 2x/day    heparin 25,000 units in NS 250 mL infusion  18 Units/kg/hr (Adjusted) Intravenous Continuous    insulin glargine-yfgn 100 units/mL injection  7 Units Subcutaneous NIGHTLY    insulin lispro (HUMALOG) 100 units/mL injection  4 Units Subcutaneous 3x/day AC    [START ON 04/02/2021] levothyroxine (SYNTHROID) tablet  88 mcg Oral QAM    methocarbamol (ROBAXIN) tablet  500 mg Oral 4x/day    NS 250 mL flush bag   Intravenous Q15 Min PRN    NS flush syringe  2-6 mL Intracatheter Q8HRS    NS flush syringe  2-6 mL Intracatheter Q1 MIN  PRN    pantoprazole (PROTONIX) delayed release tablet  40 mg Oral 2x/day    polyethylene glycol (MIRALAX) oral packet  17 g Oral 2x/day    SSIP insulin lispro 100 units/mL injection  0-12 Units Subcutaneous Q4H PRN       Allene Pyo, PA-C  04/01/2021, 08:38    I personally saw and examined the patient. See physician's assistant note for additional details.     Lynwood Dawley, MD, FACS

## 2021-04-01 NOTE — Nurses Notes (Deleted)
Called report to hospice, asked to leave port accessed for easier lab draw. Plan to finish K bag then d/c

## 2021-04-02 ENCOUNTER — Inpatient Hospital Stay (HOSPITAL_COMMUNITY): Payer: 59

## 2021-04-02 ENCOUNTER — Other Ambulatory Visit: Payer: Self-pay

## 2021-04-02 ENCOUNTER — Encounter (INDEPENDENT_AMBULATORY_CARE_PROVIDER_SITE_OTHER): Payer: Self-pay

## 2021-04-02 DIAGNOSIS — R059 Cough, unspecified: Secondary | ICD-10-CM

## 2021-04-02 DIAGNOSIS — D72829 Elevated white blood cell count, unspecified: Secondary | ICD-10-CM

## 2021-04-02 LAB — CBC WITH DIFF
BASOPHIL #: <0.1 10*3/uL (ref <=0.20)
BASOPHIL %: 0 %
EOSINOPHIL #: <0.1 10*3/uL (ref <=0.50)
EOSINOPHIL %: 0 %
HCT: 29.6 % — ABNORMAL LOW (ref 38.9–52.0)
HGB: 9.3 g/dL — ABNORMAL LOW (ref 13.4–17.5)
IMMATURE GRANULOCYTE #: 0.19 10*3/uL — ABNORMAL HIGH (ref <0.10)
IMMATURE GRANULOCYTE %: 2 % — ABNORMAL HIGH (ref 0–1)
LYMPHOCYTE #: 1.37 10*3/uL (ref 1.00–4.80)
LYMPHOCYTE %: 11 %
MCH: 30.1 pg (ref 26.0–32.0)
MCHC: 31.4 g/dL (ref 31.0–35.5)
MCV: 95.8 fL (ref 78.0–100.0)
MONOCYTE #: 0.64 10*3/uL (ref 0.20–1.10)
MONOCYTE %: 5 %
MPV: 11 fL (ref 8.7–12.5)
NEUTROPHIL #: 9.74 10*3/uL — ABNORMAL HIGH (ref 1.50–7.70)
NEUTROPHIL %: 82 %
PLATELETS: 465 10*3/uL — ABNORMAL HIGH (ref 150–400)
RBC: 3.09 10*6/uL — ABNORMAL LOW (ref 4.50–6.10)
RDW-CV: 16.7 % — ABNORMAL HIGH (ref 11.5–15.5)
WBC: 12 10*3/uL — ABNORMAL HIGH (ref 3.7–11.0)

## 2021-04-02 LAB — BASIC METABOLIC PANEL
ANION GAP: 8 mmol/L (ref 4–13)
BUN/CREA RATIO: 15 (ref 6–22)
BUN: 13 mg/dL (ref 8–25)
CALCIUM: 8.6 mg/dL — ABNORMAL LOW (ref 8.8–10.2)
CHLORIDE: 104 mmol/L (ref 96–111)
CO2 TOTAL: 24 mmol/L (ref 23–31)
CREATININE: 0.88 mg/dL (ref 0.75–1.35)
ESTIMATED GFR: 90 mL/min/BSA (ref >=60)
GLUCOSE: 117 mg/dL (ref 65–125)
POTASSIUM: 3.9 mmol/L (ref 3.5–5.1)
SODIUM: 136 mmol/L (ref 136–145)

## 2021-04-02 LAB — PHOSPHORUS: PHOSPHORUS: 2.2 mg/dL — ABNORMAL LOW (ref 2.3–4.0)

## 2021-04-02 LAB — POC BLOOD GLUCOSE (RESULTS)
GLUCOSE, POC: 122 mg/dl — ABNORMAL HIGH (ref 70–105)
GLUCOSE, POC: 124 mg/dl — ABNORMAL HIGH (ref 70–105)
GLUCOSE, POC: 157 mg/dl — ABNORMAL HIGH (ref 70–105)

## 2021-04-02 LAB — MAGNESIUM: MAGNESIUM: 2.2 mg/dL (ref 1.8–2.6)

## 2021-04-02 LAB — PTT (PARTIAL THROMBOPLASTIN TIME): APTT: 53.9 seconds — ABNORMAL HIGH (ref 24.2–37.5)

## 2021-04-02 LAB — ADULT ROUTINE BLOOD CULTURE, SET OF 2 BOTTLES (BACTERIA AND YEAST): BLOOD CULTURE, ROUTINE: NO GROWTH

## 2021-04-02 MED ORDER — APIXABAN 5 MG TABLET
5.0000 mg | ORAL_TABLET | Freq: Two times a day (BID) | ORAL | 1 refills | Status: DC
Start: 2021-04-09 — End: 2021-04-02
  Filled 2021-04-02: qty 180, 90d supply, fill #0

## 2021-04-02 MED ORDER — APIXABAN 5 MG TABLET
10.0000 mg | ORAL_TABLET | Freq: Two times a day (BID) | ORAL | Status: DC
Start: 2021-04-02 — End: 2021-04-02
  Administered 2021-04-02: 10 mg via ORAL
  Filled 2021-04-02: qty 2

## 2021-04-02 MED ORDER — HEPARIN (PORCINE) 5,000 UNITS/ML BOLUS FOR DOSE ADJUSTMENT
25.0000 [IU]/kg | Freq: Once | INTRAMUSCULAR | Status: AC
Start: 2021-04-02 — End: 2021-04-02
  Administered 2021-04-02: 2000 [IU] via INTRAVENOUS
  Filled 2021-04-02: qty 1

## 2021-04-02 MED ORDER — APIXABAN 5 MG TABLET
10.0000 mg | ORAL_TABLET | Freq: Two times a day (BID) | ORAL | 0 refills | Status: DC
Start: 2021-04-02 — End: 2021-04-02
  Filled 2021-04-02: qty 28, 7d supply, fill #0

## 2021-04-02 MED ORDER — APIXABAN 5 MG TABLET
5.0000 mg | ORAL_TABLET | Freq: Two times a day (BID) | ORAL | 1 refills | Status: DC
Start: 2021-04-09 — End: 2021-05-28

## 2021-04-02 MED ORDER — APIXABAN 5 MG TABLET
5.0000 mg | ORAL_TABLET | Freq: Two times a day (BID) | ORAL | Status: DC
Start: 2021-04-09 — End: 2021-04-02

## 2021-04-02 MED ORDER — SODIUM PHOSPHATE 3 MMOL/ML INTRAVENOUS SOLUTION
20.0000 mmol | Freq: Once | INTRAVENOUS | Status: AC
Start: 2021-04-02 — End: 2021-04-02
  Administered 2021-04-02: 0 mmol via INTRAVENOUS
  Administered 2021-04-02: 20 mmol via INTRAVENOUS
  Filled 2021-04-02: qty 6.67

## 2021-04-02 MED ORDER — APIXABAN 5 MG TABLET
10.0000 mg | ORAL_TABLET | Freq: Two times a day (BID) | ORAL | 0 refills | Status: DC
Start: 2021-04-01 — End: 2021-05-28

## 2021-04-02 NOTE — Care Plan (Signed)
Problem: Adult Inpatient Plan of Care  Goal: Plan of Care Review  Outcome: Ongoing (see interventions/notes)  Goal: Patient-Specific Goal (Individualized)  Outcome: Ongoing (see interventions/notes)  Flowsheets (Taken 04/01/2021 2032)  Individualized Care Needs: monitor 02 dependency, heparin drip, pain control  Anxieties, Fears or Concerns: worried about new onset SOB within the last 2 months  Goal: Absence of Hospital-Acquired Illness or Injury  Outcome: Ongoing (see interventions/notes)  Intervention: Identify and Manage Fall Risk  Recent Flowsheet Documentation  Taken 04/01/2021 2032 by Ky Barban, RN  Safety Promotion/Fall Prevention:   activity supervised   fall prevention program maintained   motion sensor pad activated   nonskid shoes/slippers when out of bed   safety round/check completed  Intervention: Prevent Skin Injury  Recent Flowsheet Documentation  Taken 04/01/2021 2032 by Ky Barban, RN  Body Position:   neutral body alignment   neutral head position  Skin Protection:   adhesive use limited   tubing/devices free from skin contact  Intervention: Prevent and Manage VTE (Venous Thromboembolism) Risk  Recent Flowsheet Documentation  Taken 04/01/2021 2032 by Ky Barban, RN  VTE Prevention/Management:   ambulation promoted   anticoagulant therapy maintained  Intervention: Prevent Infection  Recent Flowsheet Documentation  Taken 04/01/2021 2032 by Ky Barban, RN  Infection Prevention:   barrier precautions utilized   glycemic control managed   promote handwashing   rest/sleep promoted   single patient room provided  Goal: Optimal Comfort and Wellbeing  Outcome: Ongoing (see interventions/notes)  Intervention: Provide Person-Centered Care  Recent Flowsheet Documentation  Taken 04/01/2021 2032 by Ky Barban, [place] Relationship/Rapport: care explained     Problem: Skin Injury Risk Increased  Goal: Skin Health and Integrity  Outcome: Ongoing (see interventions/notes)  Intervention:  Optimize Skin Protection  Recent Flowsheet Documentation  Taken 04/01/2021 2032 by Ky Barban, RN  Pressure Reduction Techniques: frequent weight shift encouraged  Pressure Reduction Devices: (M,H,VH) Use Repositioning Devices or Pillows  Skin Protection:   adhesive use limited   tubing/devices free from skin contact  Activity Management: activity adjusted per tolerance  Head of Bed (HOB) Positioning: HOB at 30-45 degrees     Problem: Fall Injury Risk  Goal: Absence of Fall and Fall-Related Injury  Outcome: Ongoing (see interventions/notes)  Intervention: Identify and Manage Contributors  Recent Flowsheet Documentation  Taken 04/01/2021 2032 by Ky Barban, RN  Self-Care Promotion:   independence encouraged   BADL personal objects within reach   BADL personal routines maintained  Intervention: Promote Injury-Free Environment  Recent Flowsheet Documentation  Taken 04/01/2021 2032 by Ky Barban, RN  Safety Promotion/Fall Prevention:   activity supervised   fall prevention program maintained   motion sensor pad activated   nonskid shoes/slippers when out of bed   safety round/check completed     Problem: Pain Acute  Goal: Optimal Pain Control and Function  Outcome: Ongoing (see interventions/notes)  Intervention: Prevent or Manage Pain  Recent Flowsheet Documentation  Taken 04/01/2021 2032 by Ky Barban, RN  Sensory Stimulation Regulation: auditory stimulation minimized  Sleep/Rest Enhancement:   awakenings minimized   room darkened  Intervention: Optimize Psychosocial Wellbeing  Recent Flowsheet Documentation  Taken 04/01/2021 2032 by Ky Barban, RN  Diversional Activities:   smartphone   television  Supportive Measures: active listening utilized

## 2021-04-02 NOTE — Care Management Notes (Addendum)
[facility] Management Note    Patient Name: [name]  Date of Birth: July 09, 1945  Sex: male  Date/Time of Admission: 03/30/2021 10:08 PM  Room/Bed: 979/A  Payor: [employer]ER[employer] AD[employer] / Plan: [employer] / Product Type: Special Bill /    LOS: 2 days   Primary Care Providers:  [place], [facility] ([facility])    Admitting Diagnosis:  Pulmonary embolism (CMS [facility]) [I26.99]    Assessment:      04/02/21 1047   Assessment Details   Assessment Type Continued Assessment   Date of Care Management Update 04/02/21   Date of Next DCP Update 04/05/21   Care Management Plan   Discharge Planning Status plan in progress   Projected Discharge Date 04/03/21   Discharge Needs Assessment   Discharge Facility/Level of Care Needs Home (Patient/Family Member/other)(code 1)       Discharge Plan:  Home (Patient/Family Member/other) (code 1)    Prescription coverage through [place].  Requires 7 day supply of Eliquis to go through [place] [id number], [employer], Group # [id number], account # is patient SS#.  Patient to d/c on Eliquis 10 mg BID for 7 days.  Then Eliquis 5 mg BID for 6 months, may be sent to [place].  PT d/c dispo home with assist, no DME recommended.  [place] anticipates home d/c with no needs, will continue to assess.    Addendum:  Phone number provided by [facility] incorrect, [facility] called [facility] in [place] [phone], per [facility] service to call patient [facility] and give the following information: [employer] [phone]/ [place] [id number], [employer], Group # [id number], account # is patient SS#.  Per service, when attempted to call prescription in was told no longer has coverage.  [name] and service both attempted to contact  [facility], Care in [place] [phone], left VM, awaiting for return call.  [place] updated service to call coag dept at hospital as per [facility] she provided dept with information to obtain medication, service to contact for assistance.    The patient will  continue to be evaluated for developing discharge needs.     Case Manager: Kerry Dory, [place]  Phone: [phone]

## 2021-04-02 NOTE — Nurses Notes (Addendum)
PT called out stating his libra was reading blood sugar was 55. Assessed PT and was vitally stable, asymptomatic and blood sugar was 122. PT stated pain was a 5-6 around JP drain site. PRN tylenol was given per protocol. PT has no further questions at the time.     9371    Latest Reference Range & Units 04/02/21 03:55   aPTT 24.2 - 37.5 seconds 53.9 (H)   (H): Data is abnormally high    PT PTT is no longer therapeutic. PT was give heparin bolus and increased to 19 units/kr/hr. Next PTT will be at 1116.

## 2021-04-02 NOTE — Progress Notes (Signed)
[facility]  Surgical Oncology Progress Note      [name], [name], 76 y.o. male  Date of Birth:  May 01, 1945  Date of Admission:  03/30/2021  Date of service: 04/02/2021    Assessment:  This is a 76 y.o. 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 Who is admitted for rigors and abdominal pain, who was found to have right segmental lower lobe PE.    Plan/Recommendations:  -D/C heparin, transition to Eliquis  - Wean o2  - Diet: Diabetic diet  - IVF: discontinued   - Pain control: Tylenol, robaxin  - Labs: Leukocytosis 12.0 down from 16.1  - Imaging: None  - Nursing: OOB, IS  - Ppx   - GI: protonix       - DVT: heparin gtt  - Activity: OOB  - Dispo: Transfer to the floor    Subjective:   NAEO. Pain well controlled. Tolerated clears without issue.      Objective:  Vitals:   Filed Vitals:    04/02/21 0001 04/02/21 0330 04/02/21 0333 04/02/21 0750   BP:  128/74     Pulse:  77     Resp:  19     Temp: 36.7 C (98.1 F)  36.7 C (98.1 F) 36.7 C (98.1 F)   SpO2:  92%       Constitutional: Pleasant, NAD  HEENT: Normocephalic, atraumatic. No scleral icterus.   Neck: Trachea midline.   Cardiovascular: RRR. No peripheral edema.  Respiratory: Normal effort, chest expands symmetrically.   GI: Abdomen soft and supple; mild ttp in LLQ around drain site, non-distended. JP with tan output  Musculoskeletal: Moving all extremities.  Integumentary: Pink, warm, and dry.  Neurologic: Alert and oriented. No focal motor or sensory deficits  Psychiatric: Speech pattern and mood/affect normal.    Labs  Results for orders placed or performed during the hospital encounter of 03/30/21 (from the past 24 hour(s))   PTT (PARTIAL THROMBOPLASTIN TIME)   Result Value Ref Range    APTT 53.9 (H) 24.2 - 37.5 seconds    Narrative    Therapeutic range for unfractionated heparin is 60-100 seconds.   CBC/DIFF    Narrative    The following orders were created for panel order  CBC/DIFF.  Procedure                               Abnormality         Status                     ---------                               -----------         ------                     CBC WITH VOZD[[id number]]                Abnormal            Final result                 Please view results for these tests on the individual orders.   BASIC METABOLIC PANEL   Result Value Ref Range    SODIUM 136 136 - 145 mmol/L  POTASSIUM 3.9 3.5 - 5.1 mmol/L    CHLORIDE 104 96 - 111 mmol/L    CO2 TOTAL 24 23 - 31 mmol/L    ANION GAP 8 4 - 13 mmol/L    CALCIUM 8.6 (L) 8.8 - 10.2 mg/dL    GLUCOSE 117 65 - 125 mg/dL    BUN 13 8 - 25 mg/dL    CREATININE 0.88 0.75 - 1.35 mg/dL    BUN/CREA RATIO 15 6 - 22    ESTIMATED GFR 90 >=60 mL/min/BSA   MAGNESIUM   Result Value Ref Range    MAGNESIUM 2.2 1.8 - 2.6 mg/dL   PHOSPHORUS   Result Value Ref Range    PHOSPHORUS 2.2 (L) 2.3 - 4.0 mg/dL   CBC WITH DIFF   Result Value Ref Range    WBC 12.0 (H) 3.7 - 11.0 x10^3/uL    RBC 3.09 (L) 4.50 - 6.10 x10^6/uL    HGB 9.3 (L) 13.4 - 17.5 g/dL    HCT 29.6 (L) 38.9 - 52.0 %    MCV 95.8 78.0 - 100.0 fL    MCH 30.1 26.0 - 32.0 pg    MCHC 31.4 31.0 - 35.5 g/dL    RDW-CV 16.7 (H) 11.5 - 15.5 %    PLATELETS 465 (H) 150 - 400 x10^3/uL    MPV 11.0 8.7 - 12.5 fL    NEUTROPHIL % 82 %    LYMPHOCYTE % 11 %    MONOCYTE % 5 %    EOSINOPHIL % 0 %    BASOPHIL % 0 %    NEUTROPHIL # 9.74 (H) 1.50 - 7.70 x10^3/uL    LYMPHOCYTE # 1.37 1.00 - 4.80 x10^3/uL    MONOCYTE # 0.64 0.20 - 1.10 x10^3/uL    EOSINOPHIL # <0.10 <=0.50 x10^3/uL    BASOPHIL # <0.10 <=0.20 x10^3/uL    IMMATURE GRANULOCYTE % 2 (H) 0 - 1 %    IMMATURE GRANULOCYTE # 0.19 (H) <0.10 x10^3/uL   POC BLOOD GLUCOSE (RESULTS)   Result Value Ref Range    GLUCOSE, POC 122 (H) 70 - 105 mg/dl   POC BLOOD GLUCOSE (RESULTS)   Result Value Ref Range    GLUCOSE, POC 127 (H) 70 - 105 mg/dl   POC BLOOD GLUCOSE (RESULTS)   Result Value Ref Range    GLUCOSE, POC 217 (H) 70 - 105 mg/dl   POC BLOOD GLUCOSE (RESULTS)   Result  Value Ref Range    GLUCOSE, POC 122 (H) 70 - 105 mg/dl   POC BLOOD GLUCOSE (RESULTS)   Result Value Ref Range    GLUCOSE, POC 124 (H) 70 - 105 mg/dl       I/O:    Intake/Output Summary (Last 24 hours) at 04/02/2021 0811  Last data filed at 04/02/2021 6546  Gross per 24 hour   Intake 753 ml   Output 1730 ml   Net -977 ml        Current Medications:  Current Facility-Administered Medications   Medication Dose Route Frequency    acetaminophen (TYLENOL) tablet  650 mg Oral Q4H PRN    amLODIPine (NORVASC) tablet  5 mg Oral Daily    apixaban (ELIQUIS) tablet  10 mg Oral 2x/day    Followed by    Derrill Memo ON 04/09/2021] apixaban (ELIQUIS) tablet  5 mg Oral 2x/day    atorvastatin (LIPITOR) tablet  40 mg Oral Daily    D5W 250 mL flush bag   Intravenous Q15 Min PRN    docusate sodium (  COLACE) capsule  100 mg Oral 2x/day    elvitegravir-cobicistat-emtricitrabine-tenofovir (GENVOYA) 150-150-200-10 mg per tablet  1 Tablet Oral Daily    gabapentin (NEURONTIN) capsule  300 mg Oral 2x/day    insulin glargine-yfgn 100 units/mL injection  7 Units Subcutaneous NIGHTLY    insulin lispro (HUMALOG) 100 units/mL injection  4 Units Subcutaneous 3x/day AC    levothyroxine (SYNTHROID) tablet  88 mcg Oral QAM    methocarbamol (ROBAXIN) tablet  500 mg Oral 4x/day    NS 250 mL flush bag   Intravenous Q15 Min PRN    NS flush syringe  2-6 mL Intracatheter Q8HRS    NS flush syringe  2-6 mL Intracatheter Q1 MIN PRN    pantoprazole (PROTONIX) delayed release tablet  40 mg Oral 2x/day    polyethylene glycol (MIRALAX) oral packet  17 g Oral 2x/day    sodium phosphate 20 mmol in D5W 100 mL IVPB  20 mmol Intravenous Once    SSIP insulin lispro 100 units/mL injection  0-12 Units Subcutaneous Q4H PRN       Allene Pyo, PA-C  04/02/2021, 08:18    I personally saw and examined the patient. See physician's assistant note for additional details.     Lynwood Dawley, MD, FACS

## 2021-04-02 NOTE — Discharge Summary (Signed)
[facility]  DISCHARGE SUMMARY    PATIENT NAME:  [name], [name]  MRN:  [id number]  DOB:  03-Apr-1945    ENCOUNTER DATE:  03/30/2021  INPATIENT ADMISSION DATE: 03/31/2021  DISCHARGE DATE:  04/02/2021    ATTENDING PHYSICIAN: Nicola Police, MD  SERVICE: SURG ONCOLOGY  PRIMARY CARE PHYSICIAN: [place]       No lay caregiver identified.      PRIMARY DISCHARGE DIAGNOSIS: Pulmonary embolism (CMS [facility])  Active Hospital Problems    Diagnosis Date Noted    Principal Problem: Pulmonary embolism (CMS [facility]) [I26.99] 03/31/2021      Resolved Hospital Problems   No resolved problems to display.     Active Non-Hospital Problems    Diagnosis Date Noted    IPMN (intraductal papillary mucinous neoplasm) 03/22/2021    Cellulitis 01/04/2021    Abscess of left groin 01/03/2021    Gram-positive cocci bacteremia 01/03/2021    Sepsis due to cellulitis (CMS HCC) 01/02/2021    Lactic acidosis 01/02/2021    Rash 01/02/2021    Candida infection of genital region 11/03/2020    Chronic renal failure syndrome 11/03/2020    Postherpetic neuralgia 11/03/2020    Hyperlipidemia 11/03/2020    Thyroid disorder 11/03/2020    Sprain of knee 11/03/2020    Seborrhea 11/03/2020    Positive laboratory testing for human immunodeficiency virus (CMS [place]) 11/03/2020    Mild episode of recurrent major depressive disorder (CMS [place]) 11/03/2020    Actinic keratosis 11/03/2020    Multiple actinic keratoses 11/03/2020    Malignant neoplasm of prostate (CMS [place]) 11/03/2020    Incisional hernia 11/03/2020    Fall 11/03/2020    Diverticulitis of colon 11/03/2020    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[place]) 04/19/2020    Acute cystitis without hematuria 04/19/2020    Chest pain 03/24/2020    Near syncope 03/24/2020    Acute kidney injury (CMS [place]) 03/24/2020    Pancreatic mass 03/24/2020    GERD without esophagitis 03/24/2020    Essential hypertension 03/24/2020    Acquired hypothyroidism 03/24/2020    Mixed hyperlipidemia 03/24/2020    HIV  infection (CMS [place]) 03/24/2020    Generalized anxiety disorder 03/13/2020    Cervicalgia 02/06/2020    Low back pain 02/06/2020    Pain in left knee 11/29/2019    Inflammation of sacroiliac joint (CMS [place]) 02/15/2019    Acute respiratory distress syndrome (ARDS) (CMS HCC) 01/17/2019    Depressive disorder 01/17/2019    Diverticulitis 01/17/2019    Herpes zoster 01/17/2019    Asymptomatic HIV infection (CMS [place]) 01/17/2019    Risk of exposure to communicable disease 08/22/2018    Nausea 08/22/2018           Current Discharge Medication List        START taking these medications.        Details   * apixaban 5 mg Tablet  Commonly known as: ELIQUIS   10 mg, Oral, 2 TIMES DAILY  Qty: 28 Tablet  Refills: 0     * apixaban 5 mg Tablet  Commonly known as: ELIQUIS  Start taking on: April 09, 2021   5 mg, Oral, 2 TIMES DAILY  Qty: 180 Tablet  Refills: 1           * This list has 2 medication(s) that are the same as other medications prescribed for you. Read the directions carefully, and ask your doctor or other care provider to review them  with you.                CONTINUE these medications which have CHANGED during your visit.        Details   insulin glargine 100 unit/mL Insulin Pen  Commonly known as: Lantus Solostar U-100 Insulin  What changed:   how much to take  how to take this  when to take this  additional instructions   Inject 15 units under the skin daily  Qty: 15 mL  Refills: 12            CONTINUE these medications - NO CHANGES were made during your visit.        Details   acetaminophen 500 mg Tablet  Commonly known as: TYLENOL   1,000 mg, Oral, EVERY 4 HOURS PRN  Refills: 0     amLODIPine 5 mg Tablet  Commonly known as: NORVASC   5 mg, Oral, DAILY  Refills: 0     atorvastatin 80 mg Tablet  Commonly known as: LIPITOR   40 mg, Oral, EVERY MORNING WITH BREAKFAST  Refills: 0     BIOTIN ORAL   1 Tablet, Oral, DAILY  Refills: 0     Blood Sugar Diagnostic Strip  Commonly known as: Accu-Chek Guide test strips   1 Strip,  Does not apply, 4 TIMES DAILY - BEFORE MEALS AND NIGHTLY  Qty: 150 Each  Refills: 3     cetirizine 10 mg Tablet  Commonly known as: ZYRTEC   10 mg, Oral, DAILY  Refills: 0     cyanocobalamin 1,000 mcg Tablet  Commonly known as: VITAMIN B 12   1,000 mcg, Oral, EVERY MORNING  Refills: 0     FreeStyle Libre 2 Reader Misc  Generic drug: flash glucose scanning reader   Use as directed with FreeStyle Libre 2 sensors  Qty: 1 Each  Refills: 1     FreeStyle Libre 2 Sensor Kit  Generic drug: flash glucose sensor   To check blood glucose continuously. Change every 14 days  Qty: 6 Each  Refills: 3     gabapentin 300 mg Capsule  Commonly known as: NEURONTIN   1 Tablet, Oral, 3 TIMES DAILY  Refills: 0     Genvoya 150-150-200-10 mg Tablet  Generic drug: elviteg-cob-emtri-tenof ALAFEN   1 Tablet, Oral, DAILY  Refills: 0     insulin syr/ndl U100 half mark 0.3 mL 31 gauge x 5/16" Syringe   Use a new syringe for each injection.  Qty: 150 Each  Refills: 3     lancets 28 gauge Misc  Commonly known as: FreeStyle Lancets   Use to test blood sugar 4 times a day - before meals and bedtime  Qty: 150 Each  Refills: 3     levothyroxine 88 mcg Tablet  Commonly known as: SYNTHROID   88 mcg, Oral, EVERY MORNING  Refills: 0     MELATONIN ORAL   1 Tablet, Oral, NIGHTLY  Refills: 0     MULTIVITAMIN ORAL   1 Tablet, Oral, DAILY  Refills: 0     NovoLOG FlexPen U-100 Insulin 100 unit/mL (3 mL) Insulin Pen  Generic drug: insulin aspart U-100   4 Units, Subcutaneous, 3 TIMES DAILY, With meals  Refills: 0     pantoprazole 40 mg Tablet, Delayed Release (E.C.)  Commonly known as: PROTONIX   40 mg, Oral, 2 TIMES DAILY, 30 minutes before a meal  Refills: 0     Pen Needle (Disposable) 32 gauge x 5/32" Needle  Commonly known as: BD Nano 2nd Gen Pen Needle   Use a new pen needle for each injection.  Qty: 150 Each  Refills: 11     POTASSIUM ORAL   1 Tablet, Oral, DAILY  Refills: 0            STOP taking these medications.      NovoLIN R Regular U-100 Insuln 100  unit/mL Solution  Generic drug: insulin regular human     oxyCODONE 5 mg Tablet  Commonly known as: ROXICODONE            ASK your doctor about these medications.        Details   insulin lispro 100 unit/mL Insulin Pen  Commonly known as: HumaLOG KwikPen Insulin   Inject 4 units with each meal. 1 box = 140 day supply  Qty: 30 mL  Refills: 12            Discharge med list refreshed?  YES     No Known Allergies  HOSPITAL PROCEDURE(S):   No orders of the defined types were placed in this encounter.      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 Workup in the ED was positive for right lower lobe segmental pulmonary embolus and new onset oxygen requirement.        BRIEF HOSPITAL NARRATIVE:   Patient was started on heparin gtt and admitted to surgical oncology. Throughout hospital stay patient was weaned off O2 and his SOB improved. Patient remained on heparin gtt x24 hours. He was transitioned to Eliquis. Patient was asymptomatic and was discharged home.     TRANSITION/POST DISCHARGE CARE/PENDING TESTS/REFERRALS:   - Continue Eliquis x 6 months    CONDITION ON DISCHARGE:  A. Ambulation: Full ambulation  B. Self-care Ability: Complete  C. Cognitive Status Alert and Oriented x 3  D. Code status at discharge:       LINES/DRAINS/WOUNDS AT DISCHARGE:   Patient Lines/Drains/Airways Status       Active Line / Dialysis Catheter / Dialysis Graft / Drain / Airway / Wound       Name Placement date Placement time Site Days    Peripheral IV Ultrasound guided Right Cephalic  (lateral side of arm) 03/30/21  2241  -- 2    Peripheral IV Ultrasound guided;Extended dwell catheter Left Basilic  (medial side of arm) --  --  -- --    Terrial Rhodes  Drain Left;Lower Flank 03/22/21  1000  -- 11    Surgical Incision Medial;Upper Abdomen 03/31/21  0430  -- 2                    DISCHARGE DISPOSITION:  Home discharge  DISCHARGE INSTRUCTIONS:  Post-Discharge Follow Up Appointments       Wednesday Apr 14, 2021    Return Patient Visit with Shannan Harper, MD at 11:00 AM      Thursday May 06, 2021    New Patient Visit with Julius Bowels, APRN,NP-C at 10:00 AM      Endocrinology, [facility], [address]  [place] [address]  [phone] Gastroenterology/Hepatology, [place]  [place] [address]  [phone]             DISCHARGE INSTRUCTION - RESUME HOME DIET     Diet: RESUME HOME DIET      DISCHARGE INSTRUCTION - ACTIVITY - NO LIFTING GREATER THAN 10 POUNDS     Activity: NO LIFTING OVER 10 POUNDS      DISCHARGE INSTRUCTION - INCISION/WOUND CARE    POST-OPERATIVE WOUND CARE   From the day of placement, the stiches/staples on the surgical wound will be removed in 10-14 days. You will have an appointment with your surgeon for the removal.   Coy the wounds at least once a day with warm, soapy water. You may do this easily in the shower. No swimming or submerging the wound, like in a pool, bathtub or hot tub.  Pat the wound dry. DO NOT RUB!  Please refer to your diet and activity instructions. You are on these restrictions until your clinic appointment(s) and evaluation of your healing.  SEEK MEDICAL ATTENTION IF:  There is redness, swelling or increased pain in the wound that is not controlled with pain medications as prescribed.  There is drainage, blood or pus coming from the wound lasting longer than 1 day, or sooner if there is a concern.  You develop signs of a generalized infection including muscle aches, chills, fever or a general ill feeling.  You notice a foul smell coming from the wound  or dressing.  You developed persistent nausea or vomiting.     Instructions for incision/wound care: z - other (specify in comments)      DISCHARGE INSTRUCTION - SIGNS AND SYMPTOMS OF INFECTION    SIGNS AND SYMPTOMS OF INFECTION    Please watch your incision/wound site for the following signs of potential infection:    Increased redness or warmth at the incision site.  Drainage from the wound that may be foul smelling, cloudy, yellow or green in color.  Bulging or increased swelling at the incision site.  A temperature of more than 101.5 degrees F by mouth for 2 readings taken 4 hours apart.  A sudden increase in pain at the wound that is not relieved with pain medication.     DISCHARGE INSTRUCTION - POST-SURGICAL PAIN    Post surgical pain is a complex response to tissue trauma during surgery that stimulates hypersensitivity of the nervous system. Post-operative pain can be felt after any surgical procedure. Post-operative pain increases the possibility of post-surgical complications, raises the cost of medical care, and most importantly, interferes with recovery and return to normal activities of daily living. Management of post-surgical pain is a basic patient right.    Recent studies on pain control are indicating that taking a non-steroidal anti-inflammatory drug such as ibuprofen (Advil, Motrin) together with acetaminophen (Tylenol) has more significant post-operative pain relief than taking either drug alone.  Also, the ibuprofen and acetaminophen combination has significantly more pain relief than narcotic medications such as codeine, hydrocodone (Vicodin, Norco, Lortab), and oxycodone (Percocet, Percodan).    For mild to moderate discomfort following surgery, 400 mg of ibuprofen (2 tablets of Advil or Motrin) every 4 hours as needed or 600 mg of ibuprofen (3 tablets of Advil or Motrin) every 6 hours as needed is usually adequate.  If you are unable to take ibuprofen, then 500 - 1000 mg of  acetaminophen (1 or 2 tablets of Extra Strength Tylenol) every 6 hours as needed can be taken for  mild to moderate discomfort.  If you are experiencing moderate to severe pain, we suggest that you take 600 mg of ibuprofen (3 tablets of Advil or Motrin) and 1000 mg of acetaminophen (2 tablets of Extra Strength Tylenol) at the same time every 6 hours as needed.  If this does not give you adequate pain relief, please contact us for advice.    Do Not Take Tylenol or acetaminophen if you are already taking a narcotic drug that contains acetaminophen          Zigmund Daniel, MD    Copies sent to Care Team         Relationship Specialty Notifications [facility] PCP - General EXTERNAL  03/19/18     Phone: [phone] Fax: [phone]         [address] [name] [address]            Referring providers can utilize [email or web address] to access their referred [place] patient's information.

## 2021-04-03 ENCOUNTER — Emergency Department (HOSPITAL_COMMUNITY): Payer: 59

## 2021-04-03 ENCOUNTER — Emergency Department
Admission: EM | Admit: 2021-04-03 | Disposition: A | Discharge status: Home / Self Care | Payer: Medicare Other | Source: Ambulatory Visit

## 2021-04-03 ENCOUNTER — Emergency Department (HOSPITAL_COMMUNITY): Payer: 59 | Admitting: Certified Registered"

## 2021-04-03 ENCOUNTER — Inpatient Hospital Stay (HOSPITAL_COMMUNITY): Payer: 59

## 2021-04-03 ENCOUNTER — Other Ambulatory Visit: Payer: Self-pay

## 2021-04-03 ENCOUNTER — Inpatient Hospital Stay
Admission: AD | Admit: 2021-04-03 | Discharge: 2021-05-28 | DRG: 907 | Disposition: A | Discharge status: Rehabilitation Facility | Payer: 59 | Source: Other Acute Inpatient Hospital | Attending: SURGICAL ONCOLOGY | Admitting: SURGICAL ONCOLOGY

## 2021-04-03 ENCOUNTER — Emergency Department (HOSPITAL_COMMUNITY): Payer: Non-veteran care

## 2021-04-03 ENCOUNTER — Emergency Department: Admission: EM | Admit: 2021-04-03 | Disposition: A | Discharge status: Home / Self Care | Payer: 59

## 2021-04-03 ENCOUNTER — Emergency Department
Admission: EM | Admit: 2021-04-03 | Discharge: 2021-04-03 | Disposition: A | Discharge status: Home / Self Care | Payer: 59 | Attending: Emergency Medicine | Admitting: Emergency Medicine

## 2021-04-03 ENCOUNTER — Inpatient Hospital Stay (HOSPITAL_COMMUNITY): Payer: 59 | Admitting: Surgery

## 2021-04-03 ENCOUNTER — Encounter (HOSPITAL_COMMUNITY): Payer: Self-pay

## 2021-04-03 ENCOUNTER — Inpatient Hospital Stay
Admission: EM | Admit: 2021-04-03 | Discharge: 2021-04-03 | DRG: 907 | Disposition: A | Discharge status: Other Acute Inpatient Hospital | Payer: 59 | Attending: Surgery | Admitting: Surgery

## 2021-04-03 ENCOUNTER — Inpatient Hospital Stay (HOSPITAL_COMMUNITY): Payer: 59 | Admitting: SURGICAL ONCOLOGY

## 2021-04-03 ENCOUNTER — Encounter (HOSPITAL_COMMUNITY)
Admission: EM | Disposition: A | Discharge status: Other Acute Inpatient Hospital | Payer: Self-pay | Source: Home / Self Care | Attending: Surgery

## 2021-04-03 DIAGNOSIS — Z7901 Long term (current) use of anticoagulants: Secondary | ICD-10-CM | POA: Insufficient documentation

## 2021-04-03 DIAGNOSIS — Z90411 Acquired partial absence of pancreas: Secondary | ICD-10-CM

## 2021-04-03 DIAGNOSIS — Z515 Encounter for palliative care: Secondary | ICD-10-CM

## 2021-04-03 DIAGNOSIS — B9689 Other specified bacterial agents as the cause of diseases classified elsewhere: Secondary | ICD-10-CM | POA: Diagnosis not present

## 2021-04-03 DIAGNOSIS — Z9889 Other specified postprocedural states: Secondary | ICD-10-CM

## 2021-04-03 DIAGNOSIS — B952 Enterococcus as the cause of diseases classified elsewhere: Secondary | ICD-10-CM | POA: Diagnosis not present

## 2021-04-03 DIAGNOSIS — K632 Fistula of intestine: Secondary | ICD-10-CM | POA: Diagnosis not present

## 2021-04-03 DIAGNOSIS — B49 Unspecified mycosis: Secondary | ICD-10-CM | POA: Diagnosis not present

## 2021-04-03 DIAGNOSIS — Y848 Other medical procedures as the cause of abnormal reaction of the patient, or of later complication, without mention of misadventure at the time of the procedure: Secondary | ICD-10-CM | POA: Diagnosis not present

## 2021-04-03 DIAGNOSIS — B965 Pseudomonas (aeruginosa) (mallei) (pseudomallei) as the cause of diseases classified elsewhere: Secondary | ICD-10-CM | POA: Diagnosis not present

## 2021-04-03 DIAGNOSIS — B20 Human immunodeficiency virus [HIV] disease: Secondary | ICD-10-CM | POA: Diagnosis present

## 2021-04-03 DIAGNOSIS — R58 Hemorrhage, not elsewhere classified: Secondary | ICD-10-CM

## 2021-04-03 DIAGNOSIS — K56609 Unspecified intestinal obstruction, unspecified as to partial versus complete obstruction: Secondary | ICD-10-CM

## 2021-04-03 DIAGNOSIS — E1036 Type 1 diabetes mellitus with diabetic cataract: Secondary | ICD-10-CM | POA: Diagnosis not present

## 2021-04-03 DIAGNOSIS — J9811 Atelectasis: Secondary | ICD-10-CM

## 2021-04-03 DIAGNOSIS — R000 Tachycardia, unspecified: Secondary | ICD-10-CM | POA: Insufficient documentation

## 2021-04-03 DIAGNOSIS — N189 Chronic kidney disease, unspecified: Secondary | ICD-10-CM | POA: Diagnosis present

## 2021-04-03 DIAGNOSIS — K651 Peritoneal abscess: Secondary | ICD-10-CM | POA: Diagnosis not present

## 2021-04-03 DIAGNOSIS — Z8546 Personal history of malignant neoplasm of prostate: Secondary | ICD-10-CM

## 2021-04-03 DIAGNOSIS — I330 Acute and subacute infective endocarditis: Secondary | ICD-10-CM

## 2021-04-03 DIAGNOSIS — Z86711 Personal history of pulmonary embolism: Secondary | ICD-10-CM

## 2021-04-03 DIAGNOSIS — R571 Hypovolemic shock: Secondary | ICD-10-CM

## 2021-04-03 DIAGNOSIS — D62 Acute posthemorrhagic anemia: Secondary | ICD-10-CM | POA: Diagnosis not present

## 2021-04-03 DIAGNOSIS — I2699 Other pulmonary embolism without acute cor pulmonale: Secondary | ICD-10-CM | POA: Diagnosis present

## 2021-04-03 DIAGNOSIS — Q453 Other congenital malformations of pancreas and pancreatic duct: Secondary | ICD-10-CM

## 2021-04-03 DIAGNOSIS — K219 Gastro-esophageal reflux disease without esophagitis: Secondary | ICD-10-CM | POA: Diagnosis present

## 2021-04-03 DIAGNOSIS — K91840 Postprocedural hemorrhage and hematoma of a digestive system organ or structure following a digestive system procedure: Principal | ICD-10-CM | POA: Diagnosis present

## 2021-04-03 DIAGNOSIS — Y832 Surgical operation with anastomosis, bypass or graft as the cause of abnormal reaction of the patient, or of later complication, without mention of misadventure at the time of the procedure: Secondary | ICD-10-CM | POA: Diagnosis present

## 2021-04-03 DIAGNOSIS — K8689 Other specified diseases of pancreas: Secondary | ICD-10-CM | POA: Diagnosis not present

## 2021-04-03 DIAGNOSIS — T80818A Extravasation of other vesicant agent, initial encounter: Secondary | ICD-10-CM | POA: Diagnosis present

## 2021-04-03 DIAGNOSIS — J9600 Acute respiratory failure, unspecified whether with hypoxia or hypercapnia: Secondary | ICD-10-CM

## 2021-04-03 DIAGNOSIS — Y836 Removal of other organ (partial) (total) as the cause of abnormal reaction of the patient, or of later complication, without mention of misadventure at the time of the procedure: Secondary | ICD-10-CM | POA: Diagnosis present

## 2021-04-03 DIAGNOSIS — N179 Acute kidney failure, unspecified: Secondary | ICD-10-CM | POA: Diagnosis present

## 2021-04-03 DIAGNOSIS — E1122 Type 2 diabetes mellitus with diabetic chronic kidney disease: Secondary | ICD-10-CM | POA: Diagnosis present

## 2021-04-03 DIAGNOSIS — E7800 Pure hypercholesterolemia, unspecified: Secondary | ICD-10-CM | POA: Diagnosis present

## 2021-04-03 DIAGNOSIS — Z978 Presence of other specified devices: Secondary | ICD-10-CM

## 2021-04-03 DIAGNOSIS — I071 Rheumatic tricuspid insufficiency: Secondary | ICD-10-CM | POA: Diagnosis present

## 2021-04-03 DIAGNOSIS — Z794 Long term (current) use of insulin: Secondary | ICD-10-CM

## 2021-04-03 DIAGNOSIS — J9601 Acute respiratory failure with hypoxia: Secondary | ICD-10-CM

## 2021-04-03 DIAGNOSIS — I4891 Unspecified atrial fibrillation: Secondary | ICD-10-CM | POA: Diagnosis not present

## 2021-04-03 DIAGNOSIS — T80211A Bloodstream infection due to central venous catheter, initial encounter: Secondary | ICD-10-CM | POA: Diagnosis not present

## 2021-04-03 DIAGNOSIS — I2693 Single subsegmental pulmonary embolism without acute cor pulmonale: Secondary | ICD-10-CM | POA: Diagnosis present

## 2021-04-03 DIAGNOSIS — K5900 Constipation, unspecified: Secondary | ICD-10-CM | POA: Diagnosis not present

## 2021-04-03 DIAGNOSIS — F32A Depression, unspecified: Secondary | ICD-10-CM | POA: Diagnosis present

## 2021-04-03 DIAGNOSIS — I129 Hypertensive chronic kidney disease with stage 1 through stage 4 chronic kidney disease, or unspecified chronic kidney disease: Secondary | ICD-10-CM | POA: Diagnosis present

## 2021-04-03 DIAGNOSIS — I10 Essential (primary) hypertension: Secondary | ICD-10-CM | POA: Diagnosis present

## 2021-04-03 DIAGNOSIS — Z79899 Other long term (current) drug therapy: Secondary | ICD-10-CM

## 2021-04-03 DIAGNOSIS — Z79890 Hormone replacement therapy (postmenopausal): Secondary | ICD-10-CM

## 2021-04-03 DIAGNOSIS — R578 Other shock: Secondary | ICD-10-CM | POA: Diagnosis present

## 2021-04-03 DIAGNOSIS — R627 Adult failure to thrive: Secondary | ICD-10-CM | POA: Diagnosis not present

## 2021-04-03 DIAGNOSIS — Z9911 Dependence on respirator [ventilator] status: Secondary | ICD-10-CM

## 2021-04-03 DIAGNOSIS — Z9081 Acquired absence of spleen: Secondary | ICD-10-CM

## 2021-04-03 DIAGNOSIS — E039 Hypothyroidism, unspecified: Secondary | ICD-10-CM | POA: Diagnosis present

## 2021-04-03 DIAGNOSIS — I82409 Acute embolism and thrombosis of unspecified deep veins of unspecified lower extremity: Secondary | ICD-10-CM

## 2021-04-03 DIAGNOSIS — R251 Tremor, unspecified: Principal | ICD-10-CM

## 2021-04-03 DIAGNOSIS — E1065 Type 1 diabetes mellitus with hyperglycemia: Secondary | ICD-10-CM | POA: Diagnosis not present

## 2021-04-03 DIAGNOSIS — E891 Postprocedural hypoinsulinemia: Secondary | ICD-10-CM | POA: Diagnosis present

## 2021-04-03 DIAGNOSIS — G250 Essential tremor: Secondary | ICD-10-CM | POA: Diagnosis not present

## 2021-04-03 DIAGNOSIS — J9589 Other postprocedural complications and disorders of respiratory system, not elsewhere classified: Secondary | ICD-10-CM | POA: Diagnosis present

## 2021-04-03 DIAGNOSIS — I959 Hypotension, unspecified: Secondary | ICD-10-CM

## 2021-04-03 DIAGNOSIS — E119 Type 2 diabetes mellitus without complications: Secondary | ICD-10-CM

## 2021-04-03 LAB — PRODUCT: PLATELETS - UNITS
UNIT DIVISION: 0
UNIT DIVISION: 0

## 2021-04-03 LAB — GAMMA GT: GGT: 57 U/L — ABNORMAL HIGH (ref 7–50)

## 2021-04-03 LAB — BPAM PACKED CELL ORDER
UNIT DIVISION: 0
UNIT DIVISION: 0
UNIT DIVISION: 0
UNIT DIVISION: 0
UNIT DIVISION: 0
UNIT DIVISION: 0
UNIT DIVISION: 0
UNIT DIVISION: 0
UNIT DIVISION: 0
UNIT DIVISION: 0
UNIT DIVISION: 0
UNIT DIVISION: 0
UNIT DIVISION: 0
UNIT DIVISION: 0
UNIT DIVISION: 0

## 2021-04-03 LAB — PRODUCT: FFP/PLASMA - UNITS
UNIT DIVISION: 0
UNIT DIVISION: 0
UNIT DIVISION: 0
UNIT DIVISION: 0

## 2021-04-03 LAB — ECG 12 LEAD
Atrial Rate: 120 {beats}/min
Calculated P Axis: 57 degrees
Calculated R Axis: 41 degrees
Calculated T Axis: 56 degrees
PR Interval: 122 ms
QRS Duration: 66 ms
QT Interval: 320 ms
QTC Calculation: 452 ms
Ventricular rate: 120 {beats}/min

## 2021-04-03 LAB — COMPREHENSIVE METABOLIC PANEL, NON-FASTING
ALBUMIN: 2.3 g/dL — ABNORMAL LOW (ref 3.4–4.8)
ALBUMIN: 2.5 g/dL — ABNORMAL LOW (ref 3.4–4.8)
ALKALINE PHOSPHATASE: 83 U/L (ref 45–115)
ALKALINE PHOSPHATASE: 70 U/L (ref 45–115)
ALT (SGPT): 31 U/L (ref 10–55)
ALT (SGPT): 362 U/L — ABNORMAL HIGH (ref 10–55)
ANION GAP: 20 mmol/L — ABNORMAL HIGH (ref 4–13)
ANION GAP: 20 mmol/L — ABNORMAL HIGH (ref 4–13)
AST (SGOT): 20 U/L (ref 8–45)
AST (SGOT): 388 U/L — ABNORMAL HIGH (ref 8–45)
BILIRUBIN TOTAL: 0.3 mg/dL (ref 0.3–1.3)
BILIRUBIN TOTAL: 0.8 mg/dL (ref 0.3–1.3)
BUN/CREA RATIO: 10 (ref 6–22)
BUN/CREA RATIO: 11 (ref 6–22)
BUN: 14 mg/dL (ref 8–25)
BUN: 15 mg/dL (ref 8–25)
CALCIUM: 8.8 mg/dL (ref 8.8–10.2)
CALCIUM: 7.6 mg/dL — ABNORMAL LOW (ref 8.8–10.2)
CHLORIDE: 105 mmol/L (ref 96–111)
CHLORIDE: 108 mmol/L (ref 96–111)
CO2 TOTAL: 14 mmol/L — ABNORMAL LOW (ref 23–31)
CO2 TOTAL: 15 mmol/L — ABNORMAL LOW (ref 23–31)
CREATININE: 1.47 mg/dL — ABNORMAL HIGH (ref 0.75–1.35)
CREATININE: 1.41 mg/dL — ABNORMAL HIGH (ref 0.75–1.35)
ESTIMATED GFR: 49 mL/min/BSA — ABNORMAL LOW (ref >=60)
ESTIMATED GFR: 52 mL/min/BSA — ABNORMAL LOW (ref >=60)
GLUCOSE: 261 mg/dL — ABNORMAL HIGH (ref 65–125)
GLUCOSE: 208 mg/dL — ABNORMAL HIGH (ref 65–125)
POTASSIUM: 4.3 mmol/L (ref 3.5–5.1)
POTASSIUM: 4.1 mmol/L (ref 3.5–5.1)
PROTEIN TOTAL: 4.7 g/dL — ABNORMAL LOW (ref 6.0–8.0)
PROTEIN TOTAL: 4.4 g/dL — ABNORMAL LOW (ref 6.0–8.0)
SODIUM: 139 mmol/L (ref 136–145)
SODIUM: 143 mmol/L (ref 136–145)

## 2021-04-03 LAB — CBC WITH DIFF
BASOPHIL #: <0.1 10*3/uL (ref <=0.20)
BASOPHIL %: 0 %
EOSINOPHIL #: <0.1 10*3/uL (ref <=0.50)
EOSINOPHIL %: 0 %
HCT: 26.9 % — ABNORMAL LOW (ref 38.9–52.0)
HCT: 38.2 % — ABNORMAL LOW (ref 38.9–52.0)
HCT: 36.6 % — ABNORMAL LOW (ref 38.9–52.0)
HGB: 8 g/dL — ABNORMAL LOW (ref 13.4–17.5)
HGB: 12.5 g/dL — ABNORMAL LOW (ref 13.4–17.5)
HGB: 12.9 g/dL — ABNORMAL LOW (ref 13.4–17.5)
IMMATURE GRANULOCYTE #: 0.31 10*3/uL — ABNORMAL HIGH (ref <0.10)
IMMATURE GRANULOCYTE %: 2 % — ABNORMAL HIGH (ref 0–1)
LYMPHOCYTE #: 4.42 10*3/uL (ref 1.00–4.80)
LYMPHOCYTE %: 33 %
MCH: 30.5 pg (ref 26.0–32.0)
MCH: 29.6 pg (ref 26.0–32.0)
MCH: 30.1 pg (ref 26.0–32.0)
MCHC: 29.7 g/dL — ABNORMAL LOW (ref 31.0–35.5)
MCHC: 32.7 g/dL (ref 31.0–35.5)
MCHC: 35.2 g/dL (ref 31.0–35.5)
MCV: 102.7 fL — ABNORMAL HIGH (ref 78.0–100.0)
MCV: 90.3 fL (ref 78.0–100.0)
MCV: 85.5 fL (ref 78.0–100.0)
MONOCYTE #: 0.71 10*3/uL (ref 0.20–1.10)
MONOCYTE %: 5 %
MPV: 10.8 fL (ref 8.7–12.5)
MPV: 10.2 fL (ref 8.7–12.5)
MPV: 11.3 fL (ref 8.7–12.5)
NEUTROPHIL #: 7.85 10*3/uL — ABNORMAL HIGH (ref 1.50–7.70)
NEUTROPHIL %: 60 %
PLATELETS: 450 10*3/uL — ABNORMAL HIGH (ref 150–400)
PLATELETS: 193 10*3/uL (ref 150–400)
PLATELETS: 155 10*3/uL (ref 150–400)
RBC: 2.62 10*6/uL — ABNORMAL LOW (ref 4.50–6.10)
RBC: 4.23 10*6/uL — ABNORMAL LOW (ref 4.50–6.10)
RBC: 4.28 10*6/uL — ABNORMAL LOW (ref 4.50–6.10)
RDW-CV: 16.8 % — ABNORMAL HIGH (ref 11.5–15.5)
RDW-CV: 14.6 % (ref 11.5–15.5)
RDW-CV: 14.6 % (ref 11.5–15.5)
WBC: 13.3 10*3/uL — ABNORMAL HIGH (ref 3.7–11.0)
WBC: 25.1 10*3/uL — ABNORMAL HIGH (ref 3.7–11.0)
WBC: 21.9 10*3/uL — ABNORMAL HIGH (ref 3.7–11.0)

## 2021-04-03 LAB — TRIGLYCERIDE: TRIGLYCERIDES: 141 mg/dL (ref <150)

## 2021-04-03 LAB — MANUAL DIFF AND MORPHOLOGY-SYSMEX
BASOPHIL #: <0.1 10*3/uL (ref <=0.20)
BASOPHIL #: <0.1 10*3/uL (ref <=0.20)
BASOPHIL %: 0 %
BASOPHIL %: 0 %
EOSINOPHIL #: <0.1 10*3/uL (ref <=0.50)
EOSINOPHIL #: <0.1 10*3/uL (ref <=0.50)
EOSINOPHIL %: 0 %
EOSINOPHIL %: 0 %
LYMPHOCYTE #: 1 10*3/uL (ref 1.00–4.80)
LYMPHOCYTE #: 0.44 10*3/uL — ABNORMAL LOW (ref 1.00–4.80)
LYMPHOCYTE %: 4 %
LYMPHOCYTE %: 2 %
METAMYELOCYTE %: 2 %
METAMYELOCYTE %: 1 %
MONOCYTE #: 2.51 10*3/uL — ABNORMAL HIGH (ref 0.20–1.10)
MONOCYTE #: 0.44 10*3/uL (ref 0.20–1.10)
MONOCYTE %: 10 %
MONOCYTE %: 2 %
NEUTROPHIL #: 21.08 10*3/uL — ABNORMAL HIGH (ref 1.50–7.70)
NEUTROPHIL #: 21.02 10*3/uL — ABNORMAL HIGH (ref 1.50–7.70)
NEUTROPHIL %: 77 %
NEUTROPHIL %: 96 %
NEUTROPHIL BANDS %: 7 %

## 2021-04-03 LAB — ARTERIAL BLOOD GAS WITH LACTATE REFLEX
%FIO2 (ARTERIAL): 45 %
%FIO2 (ARTERIAL): 36 %
%FIO2 (ARTERIAL): 36 %
BASE DEFICIT: 7.5 mmol/L — ABNORMAL HIGH (ref 0.0–3.0)
BASE DEFICIT: 0.7 mmol/L (ref 0.0–3.0)
BASE DEFICIT: 0.2 mmol/L (ref 0.0–3.0)
BICARBONATE (ARTERIAL): 19 mmol/L (ref 18.0–26.0)
BICARBONATE (ARTERIAL): 24.3 mmol/L (ref 18.0–26.0)
BICARBONATE (ARTERIAL): 24.7 mmol/L (ref 18.0–26.0)
LACTATE: 7.1 mmol/L — ABNORMAL HIGH (ref 0.0–1.3)
LACTATE: 3.1 mmol/L — ABNORMAL HIGH (ref 0.0–1.3)
LACTATE: 1.3 mmol/L (ref 0.0–1.3)
PAO2/FIO2 RATIO: 162 (ref <=200)
PAO2/FIO2 RATIO: 183 (ref <=200)
PAO2/FIO2 RATIO: 208 (ref <=200)
PCO2 (ARTERIAL): 33 mm/Hg — ABNORMAL LOW (ref 35–45)
PCO2 (ARTERIAL): 36 mm/Hg (ref 35–45)
PCO2 (ARTERIAL): 37 mm/Hg (ref 35–45)
PH (ARTERIAL): 7.33 — ABNORMAL LOW (ref 7.35–7.45)
PH (ARTERIAL): 7.42 (ref 7.35–7.45)
PH (ARTERIAL): 7.42 (ref 7.35–7.45)
PO2 (ARTERIAL): 73 mm/Hg (ref 72–100)
PO2 (ARTERIAL): 66 mm/Hg — ABNORMAL LOW (ref 72–100)
PO2 (ARTERIAL): 75 mm/Hg (ref 72–100)

## 2021-04-03 LAB — ALK PHOS (ALKALINE PHOSPHATASE): ALKALINE PHOSPHATASE: 70 U/L (ref 45–115)

## 2021-04-03 LAB — PT/INR
INR: 1.5 (ref <=5.00)
INR: 1.31 — ABNORMAL HIGH (ref 0.80–1.20)
PROTHROMBIN TIME: 17.9 s — ABNORMAL HIGH (ref 9.7–13.6)
PROTHROMBIN TIME: 15.2 seconds — ABNORMAL HIGH (ref 9.1–13.9)

## 2021-04-03 LAB — PTT (PARTIAL THROMBOPLASTIN TIME)
APTT: 27.3 seconds (ref 26.0–39.0)
APTT: 32.3 seconds (ref 24.2–37.5)

## 2021-04-03 LAB — MAGNESIUM: MAGNESIUM: 2.3 mg/dL (ref 1.8–2.6)

## 2021-04-03 LAB — TROPONIN-I
TROPONIN I: <7 ng/L (ref <=30)
TROPONIN I: 22 ng/L (ref <=30)
TROPONIN I: 21 ng/L (ref 0–30)
TROPONIN I: 15 ng/L (ref 0–30)

## 2021-04-03 LAB — BASIC METABOLIC PANEL
ANION GAP: 19 mmol/L — ABNORMAL HIGH (ref 4–13)
BUN/CREA RATIO: 12 (ref 6–22)
BUN: 17 mg/dL (ref 8–25)
CALCIUM: 8 mg/dL — ABNORMAL LOW (ref 8.8–10.2)
CHLORIDE: 110 mmol/L (ref 96–111)
CO2 TOTAL: 14 mmol/L — ABNORMAL LOW (ref 23–31)
CREATININE: 1.39 mg/dL — ABNORMAL HIGH (ref 0.75–1.35)
ESTIMATED GFR: 53 mL/min/BSA — ABNORMAL LOW (ref >=60)
GLUCOSE: 281 mg/dL — ABNORMAL HIGH (ref 65–125)
POTASSIUM: 4.1 mmol/L (ref 3.5–5.1)
SODIUM: 143 mmol/L (ref 136–145)

## 2021-04-03 LAB — PHOSPHORUS
PHOSPHORUS: 4.9 mg/dL — ABNORMAL HIGH (ref 2.3–4.0)
PHOSPHORUS: 8 mg/dL — ABNORMAL HIGH (ref 2.3–4.0)

## 2021-04-03 LAB — BILIRUBIN TOTAL: BILIRUBIN TOTAL: 0.9 mg/dL (ref 0.3–1.3)

## 2021-04-03 LAB — TEG, RAPID GLOBAL WITH LYSIS (TRAUMA)
LYS30 (%): 0 % (ref 0.0–2.6)
MA (CRT RAPID): 65.4 mm (ref 52.0–70.0)
MA FIBRINOGEN (CFF): 31.9 mm (ref 15.0–32.0)
R (CK): 7.7 min (ref 4.6–9.1)

## 2021-04-03 LAB — LACTIC ACID LEVEL W/ REFLEX FOR LEVEL >2.0
LACTIC ACID: 12.1 mmol/L (ref 0.5–2.2)
LACTIC ACID: 13 mmol/L (ref 0.5–2.2)

## 2021-04-03 LAB — POC BLOOD GLUCOSE (RESULTS)
GLUCOSE, POC: 239 mg/dl (ref 80–130)
GLUCOSE, POC: 216 mg/dl (ref 80–130)
GLUCOSE, POC: 166 mg/dl — ABNORMAL HIGH (ref 70–105)

## 2021-04-03 LAB — H & H
HCT: 30.4 % — ABNORMAL LOW (ref 38.9–52.0)
HCT: 31.3 % — ABNORMAL LOW (ref 38.9–52.0)
HCT: 31.6 % — ABNORMAL LOW (ref 38.9–52.0)
HGB: 11 g/dL — ABNORMAL LOW (ref 13.4–17.5)
HGB: 11.4 g/dL — ABNORMAL LOW (ref 13.4–17.5)
HGB: 11.4 g/dL — ABNORMAL LOW (ref 13.4–17.5)

## 2021-04-03 LAB — ALT (SGPT): ALT (SGPT): 335 U/L — ABNORMAL HIGH (ref 10–55)

## 2021-04-03 LAB — TOTAL PROTEIN: PROTEIN TOTAL: 4.8 g/dL — ABNORMAL LOW (ref 6.0–8.0)

## 2021-04-03 LAB — AST (SGOT): AST (SGOT): 382 U/L — ABNORMAL HIGH (ref 8–45)

## 2021-04-03 LAB — TYPE AND CROSS RED CELLS - UNITS
ABO/RH(D): A NEG
ANTIBODY SCREEN: NEGATIVE
UNITS ORDERED: 14

## 2021-04-03 LAB — C-REACTIVE PROTEIN(CRP),INFLAMMATION: CRP INFLAMMATION: 42.9 mg/L — ABNORMAL HIGH (ref <8.0)

## 2021-04-03 LAB — LACTIC ACID TIMED: LACTIC ACID: 4 mmol/L (ref 0.5–2.2)

## 2021-04-03 LAB — PROCALCITONIN: PROCALCITONIN: 0.16 ng/mL (ref <0.50)

## 2021-04-03 SURGERY — LAPAROTOMY EXPLORATORY
Anesthesia: General | Site: Abdomen | Wound class: Contaminated Wounds-Open, fresh, accidental wounds

## 2021-04-03 MED ORDER — SODIUM CHLORIDE 0.9 % IV BOLUS
40.0000 mL | INJECTION | Freq: Once | Status: DC | PRN
Start: 2021-04-03 — End: 2021-04-03

## 2021-04-03 MED ORDER — MIDAZOLAM 1 MG/ML INJECTION SOLUTION
Freq: Once | INTRAMUSCULAR | Status: DC | PRN
Start: 2021-04-03 — End: 2021-04-03
  Administered 2021-04-03: 4 mg via INTRAVENOUS

## 2021-04-03 MED ORDER — ONDANSETRON HCL (PF) 4 MG/2 ML INJECTION SOLUTION
Freq: Once | INTRAMUSCULAR | Status: DC | PRN
Start: 2021-04-03 — End: 2021-04-03
  Administered 2021-04-03: 4 mg via INTRAVENOUS

## 2021-04-03 MED ORDER — PANTOPRAZOLE 40 MG TABLET,DELAYED RELEASE
40.0000 mg | DELAYED_RELEASE_TABLET | Freq: Every morning | ORAL | Status: DC
Start: 2021-04-04 — End: 2021-04-12
  Administered 2021-04-04 – 2021-04-12 (×9): 40 mg via ORAL
  Filled 2021-04-03 (×9): qty 1

## 2021-04-03 MED ORDER — DOCUSATE SODIUM 50 MG/5 ML ORAL LIQUID
100.0000 mg | Freq: Two times a day (BID) | ORAL | Status: DC
Start: 2021-04-04 — End: 2021-04-06
  Administered 2021-04-04 (×2): 0 mg via ORAL
  Administered 2021-04-05 (×2): 100 mg via ORAL
  Administered 2021-04-06: 0 mg via ORAL
  Filled 2021-04-03 (×3): qty 10

## 2021-04-03 MED ORDER — OXYCODONE 5 MG TABLET
5.0000 mg | ORAL_TABLET | ORAL | Status: DC | PRN
Start: 2021-04-03 — End: 2021-04-03
  Administered 2021-04-03: 5 mg via GASTROSTOMY
  Filled 2021-04-03: qty 1

## 2021-04-03 MED ORDER — ATORVASTATIN 40 MG TABLET
40.0000 mg | ORAL_TABLET | Freq: Every morning | ORAL | Status: DC
Start: 2021-04-04 — End: 2021-05-28
  Administered 2021-04-04 – 2021-04-06 (×3): 0 mg via ORAL
  Administered 2021-04-07 – 2021-04-20 (×14): 40 mg via ORAL
  Administered 2021-04-21 – 2021-04-22 (×2): 0 mg via ORAL
  Administered 2021-04-23 – 2021-04-24 (×2): 40 mg via ORAL
  Administered 2021-04-25: 0 mg via ORAL
  Administered 2021-04-26 – 2021-05-04 (×9): 40 mg via ORAL
  Administered 2021-05-05 – 2021-05-06 (×2): 0 mg via ORAL
  Administered 2021-05-07 – 2021-05-13 (×7): 40 mg via ORAL
  Administered 2021-05-14: 0 mg via ORAL
  Administered 2021-05-15 – 2021-05-19 (×5): 40 mg via ORAL
  Administered 2021-05-20: 0 mg via ORAL
  Administered 2021-05-21 – 2021-05-28 (×8): 40 mg via ORAL
  Filled 2021-04-03 (×51): qty 1

## 2021-04-03 MED ORDER — SODIUM CHLORIDE 0.9 % (FLUSH) INJECTION SYRINGE
3.0000 mL | INJECTION | INTRAMUSCULAR | Status: DC | PRN
Start: 2021-04-03 — End: 2021-04-03

## 2021-04-03 MED ORDER — INSULIN REGULAR HUMAN 100 UNIT/ML INJECTION SSIP
0.0000 [IU] | INJECTION | Freq: Four times a day (QID) | SUBCUTANEOUS | Status: DC | PRN
Start: 2021-04-03 — End: 2021-04-15
  Administered 2021-04-03: 6 [IU] via SUBCUTANEOUS
  Administered 2021-04-03: 2 [IU] via SUBCUTANEOUS
  Administered 2021-04-05: 4 [IU] via SUBCUTANEOUS
  Administered 2021-04-06 – 2021-04-10 (×5): 2 [IU] via SUBCUTANEOUS
  Administered 2021-04-10: 4 [IU] via SUBCUTANEOUS
  Administered 2021-04-11: 6 [IU] via SUBCUTANEOUS
  Administered 2021-04-11: 2 [IU] via SUBCUTANEOUS
  Administered 2021-04-12 – 2021-04-13 (×2): 4 [IU] via SUBCUTANEOUS
  Administered 2021-04-13 (×3): 2 [IU] via SUBCUTANEOUS
  Administered 2021-04-14: 4 [IU] via SUBCUTANEOUS
  Administered 2021-04-14 (×3): 2 [IU] via SUBCUTANEOUS
  Administered 2021-04-15: 4 [IU] via SUBCUTANEOUS
  Filled 2021-04-03: qty 12
  Filled 2021-04-03 (×2): qty 6
  Filled 2021-04-03: qty 18
  Filled 2021-04-03: qty 6
  Filled 2021-04-03: qty 12
  Filled 2021-04-03: qty 6
  Filled 2021-04-03: qty 12
  Filled 2021-04-03: qty 6
  Filled 2021-04-03: qty 300
  Filled 2021-04-03: qty 18

## 2021-04-03 MED ORDER — FENTANYL (PF) 50 MCG/ML INJECTION SOLUTION
INTRAMUSCULAR | Status: AC
Start: 2021-04-03 — End: 2021-04-03
  Filled 2021-04-03: qty 2

## 2021-04-03 MED ORDER — SODIUM BICARBONATE 8.4 % (1 MEQ/ML) INTRAVENOUS SYRINGE
INJECTION | INTRAVENOUS | Status: AC
Start: 2021-04-03 — End: 2021-04-03
  Filled 2021-04-03: qty 50

## 2021-04-03 MED ORDER — ETOMIDATE 2 MG/ML INTRAVENOUS SOLUTION
INTRAVENOUS | Status: AC
Start: 2021-04-03 — End: 2021-04-03
  Filled 2021-04-03: qty 20

## 2021-04-03 MED ORDER — LANSOPRAZOLE 3 MG/ML ORAL SUSPENSION COMPOUNDING KIT
30.0000 mg | INHALATION_SUSPENSION | Freq: Two times a day (BID) | ORAL | Status: DC
Start: 2021-04-03 — End: 2021-04-03
  Filled 2021-04-03 (×3): qty 10

## 2021-04-03 MED ORDER — DEXMEDETOMIDINE 400 MCG/100 ML (4 MCG/ML) IN 0.9 % SODIUM CHLORIDE IV
0.2000 ug/kg/h | INTRAVENOUS | Status: DC
Start: 2021-04-03 — End: 2021-04-03
  Administered 2021-04-03: 0 ug/kg/h via INTRAVENOUS
  Filled 2021-04-03: qty 100

## 2021-04-03 MED ORDER — FENTANYL (PF) 50 MCG/ML INJECTION SOLUTION
INTRAMUSCULAR | Status: AC
Start: 2021-04-03 — End: 2021-04-03
  Administered 2021-04-03: 100 ug via INTRAVENOUS
  Filled 2021-04-03: qty 2

## 2021-04-03 MED ORDER — TRANEXAMIC ACID 1,000 MG/100 ML(10 MG/ML)IN SOD CHLOR,ISO IV PIGGYBACK
1000.0000 mg | INJECTION | INTRAVENOUS | Status: AC
Start: 2021-04-03 — End: 2021-04-03
  Administered 2021-04-03: 1000 mg via INTRAVENOUS
  Filled 2021-04-03: qty 100

## 2021-04-03 MED ORDER — CEFAZOLIN 1 GRAM SOLUTION FOR INJECTION
Freq: Once | INTRAMUSCULAR | Status: DC | PRN
Start: 2021-04-03 — End: 2021-04-03
  Administered 2021-04-03: 2000 mg via INTRAVENOUS

## 2021-04-03 MED ORDER — NOREPINEPHRINE BITARTRATE 16 MG/250 ML (64 MCG/ML) IN 0.9 % NACL IV
0.0300 ug/kg/min | INTRAVENOUS | Status: DC
Start: 2021-04-03 — End: 2021-04-03

## 2021-04-03 MED ORDER — FENTANYL (PF) 50 MCG/ML INJECTION SOLUTION
25.0000 ug | INTRAMUSCULAR | Status: AC
Start: 2021-04-03 — End: 2021-04-03
  Administered 2021-04-03: 25 ug via INTRAVENOUS
  Filled 2021-04-03: qty 2

## 2021-04-03 MED ORDER — ONDANSETRON HCL (PF) 4 MG/2 ML INJECTION SOLUTION
4.0000 mg | Freq: Three times a day (TID) | INTRAMUSCULAR | Status: DC | PRN
Start: 2021-04-03 — End: 2021-04-04
  Administered 2021-04-04: 4 mg via INTRAVENOUS
  Filled 2021-04-03: qty 2

## 2021-04-03 MED ORDER — LACTATED RINGERS INTRAVENOUS SOLUTION
INTRAVENOUS | Status: DC
Start: 2021-04-03 — End: 2021-04-03

## 2021-04-03 MED ORDER — HYDROMORPHONE 1 MG/ML INJECTION WRAPPER
0.2000 mg | INJECTION | INTRAMUSCULAR | Status: DC | PRN
Start: 2021-04-03 — End: 2021-04-29
  Administered 2021-04-03 – 2021-04-21 (×17): 0.2 mg via INTRAVENOUS
  Filled 2021-04-03 (×18): qty 1

## 2021-04-03 MED ORDER — VASOPRESSIN 20 UNIT/ML INTRAVENOUS SOLUTION
INTRAVENOUS | Status: AC
Start: 2021-04-03 — End: 2021-04-03
  Filled 2021-04-03: qty 1

## 2021-04-03 MED ORDER — THROMBIN(HUMAN)-FIBRINOGEN-APROTININ SYN-CALCIUM 4 ML TOPICAL SYRINGE
INJECTION | Freq: Once | CUTANEOUS | Status: DC | PRN
Start: 2021-04-03 — End: 2021-04-03
  Administered 2021-04-03: 4 mL via TOPICAL

## 2021-04-03 MED ORDER — ACETAMINOPHEN 325 MG TABLET
650.0000 mg | ORAL_TABLET | ORAL | Status: DC
Start: 2021-04-04 — End: 2021-04-26
  Administered 2021-04-03: 650 mg via ORAL
  Administered 2021-04-04 (×2): 0 mg via ORAL
  Administered 2021-04-04: 650 mg via ORAL
  Administered 2021-04-04: 0 mg via ORAL
  Administered 2021-04-04: 650 mg via ORAL
  Administered 2021-04-05: 0 mg via ORAL
  Administered 2021-04-05 (×4): 650 mg via ORAL
  Administered 2021-04-05: 0 mg via ORAL
  Administered 2021-04-05 – 2021-04-06 (×4): 650 mg via ORAL
  Administered 2021-04-06: 0 mg via ORAL
  Administered 2021-04-06 – 2021-04-08 (×11): 650 mg via ORAL
  Administered 2021-04-08: 0 mg via ORAL
  Administered 2021-04-08: 650 mg via ORAL
  Administered 2021-04-09 (×3): 0 mg via ORAL
  Administered 2021-04-09 (×2): 650 mg via ORAL
  Administered 2021-04-09: 0 mg via ORAL
  Administered 2021-04-10 (×3): 650 mg via ORAL
  Administered 2021-04-10: 0 mg via ORAL
  Administered 2021-04-10 – 2021-04-12 (×11): 650 mg via ORAL
  Administered 2021-04-12 (×2): 0 mg via ORAL
  Administered 2021-04-12 – 2021-04-13 (×7): 650 mg via ORAL
  Administered 2021-04-14: 0 mg via ORAL
  Administered 2021-04-14 (×5): 650 mg via ORAL
  Administered 2021-04-15: 0 mg via ORAL
  Administered 2021-04-15: 650 mg via ORAL
  Administered 2021-04-15: 0 mg via ORAL
  Administered 2021-04-15 (×3): 650 mg via ORAL
  Administered 2021-04-16: 0 mg via ORAL
  Administered 2021-04-16 (×3): 650 mg via ORAL
  Administered 2021-04-16 – 2021-04-17 (×4): 0 mg via ORAL
  Administered 2021-04-17 (×3): 650 mg via ORAL
  Administered 2021-04-17 – 2021-04-18 (×3): 0 mg via ORAL
  Administered 2021-04-18 – 2021-04-19 (×7): 650 mg via ORAL
  Administered 2021-04-19: 0 mg via ORAL
  Administered 2021-04-19 – 2021-04-20 (×6): 650 mg via ORAL
  Administered 2021-04-20 – 2021-04-21 (×3): 0 mg via ORAL
  Administered 2021-04-21: 650 mg via ORAL
  Administered 2021-04-21: 0 mg via ORAL
  Administered 2021-04-21: 650 mg via ORAL
  Administered 2021-04-21 (×2): 0 mg via ORAL
  Administered 2021-04-22: 650 mg via ORAL
  Administered 2021-04-22 (×2): 0 mg via ORAL
  Administered 2021-04-22: 650 mg via ORAL
  Administered 2021-04-22: 0 mg via ORAL
  Administered 2021-04-23 (×2): 650 mg via ORAL
  Administered 2021-04-23 (×2): 0 mg via ORAL
  Administered 2021-04-23: 650 mg via ORAL
  Administered 2021-04-23 – 2021-04-24 (×2): 0 mg via ORAL
  Administered 2021-04-24: 650 mg via ORAL
  Administered 2021-04-24: 0 mg via ORAL
  Administered 2021-04-24 (×2): 650 mg via ORAL
  Administered 2021-04-24: 0 mg via ORAL
  Administered 2021-04-25: 650 mg via ORAL
  Administered 2021-04-25 (×2): 0 mg via ORAL
  Administered 2021-04-25: 650 mg via ORAL
  Administered 2021-04-25: 0 mg via ORAL
  Administered 2021-04-25 – 2021-04-26 (×2): 650 mg via ORAL
  Administered 2021-04-26 (×4): 0 mg via ORAL
  Filled 2021-04-03 (×102): qty 2

## 2021-04-03 MED ORDER — ELVITEG 150 MG-COB 150 MG-EMTRICIT 200 MG-TENOFO ALAFENAM 10 MG TABLET
1.0000 | ORAL_TABLET | Freq: Every day | ORAL | Status: DC
Start: 2021-04-03 — End: 2021-05-28
  Administered 2021-04-03: 0 via ORAL
  Administered 2021-04-04 – 2021-05-05 (×32): 1 via ORAL
  Administered 2021-05-06: 0 via ORAL
  Administered 2021-05-07 – 2021-05-14 (×8): 1 via ORAL
  Administered 2021-05-15: 0 via ORAL
  Administered 2021-05-16 – 2021-05-19 (×4): 1 via ORAL
  Administered 2021-05-20 – 2021-05-21 (×2): 0 via ORAL
  Administered 2021-05-22 – 2021-05-28 (×7): 1 via ORAL
  Filled 2021-04-03 (×57): qty 1

## 2021-04-03 MED ORDER — ATORVASTATIN 40 MG TABLET
40.0000 mg | ORAL_TABLET | Freq: Every morning | ORAL | Status: DC
Start: 2021-04-04 — End: 2021-04-03

## 2021-04-03 MED ORDER — NOREPINEPHRINE BITARTRATE 1 MG/ML INTRAVENOUS SOLUTION
INTRAVENOUS | Status: DC
Start: 2021-04-03 — End: 2021-04-03
  Filled 2021-04-03: qty 16

## 2021-04-03 MED ORDER — THROMBIN(HUMAN)-FIBRINOGEN-APROTININ SYN-CALCIUM 10 ML TOPICAL SYRINGE
INJECTION | CUTANEOUS | Status: AC
Start: 2021-04-03 — End: 2021-04-03
  Filled 2021-04-03: qty 10

## 2021-04-03 MED ORDER — PHENYLEPHRINE 10 MG IN NS 100 ML INFUSION - FOR ANES
INTRAVENOUS | Status: DC | PRN
Start: 2021-04-03 — End: 2021-04-03
  Administered 2021-04-03: .3 ug/kg/min via INTRAVENOUS
  Administered 2021-04-03: 0 ug/kg/min via INTRAVENOUS

## 2021-04-03 MED ORDER — SODIUM CHLORIDE 0.9 % (FLUSH) INJECTION SYRINGE
3.0000 mL | INJECTION | Freq: Three times a day (TID) | INTRAMUSCULAR | Status: DC
Start: 2021-04-03 — End: 2021-04-03

## 2021-04-03 MED ORDER — THROMBIN(HUMAN)-FIBRINOGEN-APROTININ SYN-CALCIUM 4 ML TOPICAL SYRINGE
INJECTION | CUTANEOUS | Status: AC
Start: 2021-04-03 — End: 2021-04-03
  Filled 2021-04-03: qty 4

## 2021-04-03 MED ORDER — SODIUM CHLORIDE 0.9 % IV BOLUS
2000.0000 mL | INJECTION | Status: AC
Start: 2021-04-03 — End: 2021-04-03
  Administered 2021-04-03: 2000 mL via INTRAVENOUS

## 2021-04-03 MED ORDER — OXYCODONE 10 MG TABLET
10.0000 mg | ORAL_TABLET | ORAL | Status: DC | PRN
Start: 2021-04-03 — End: 2021-04-29
  Administered 2021-04-03 – 2021-04-24 (×35): 10 mg via ORAL
  Administered 2021-04-25: 0 mg via ORAL
  Administered 2021-04-25 – 2021-04-26 (×3): 10 mg via ORAL
  Filled 2021-04-03 (×39): qty 1

## 2021-04-03 MED ORDER — PHENYLEPHRINE 10 MG/ML INJECTION SOLUTION
INTRAMUSCULAR | Status: AC
Start: 2021-04-03 — End: 2021-04-03
  Filled 2021-04-03: qty 5

## 2021-04-03 MED ORDER — ELECTROLYTE-A INTRAVENOUS SOLUTION BOLUS
1000.0000 mL | INTRAVENOUS | Status: AC
Start: 2021-04-03 — End: 2021-04-03
  Administered 2021-04-03: 1000 mL via INTRAVENOUS
  Administered 2021-04-03: 0 mL via INTRAVENOUS

## 2021-04-03 MED ORDER — IPRATROPIUM 0.5 MG-ALBUTEROL 3 MG (2.5 MG BASE)/3 ML NEBULIZATION SOLN
3.0000 mL | INHALATION_SOLUTION | Freq: Once | RESPIRATORY_TRACT | Status: DC | PRN
Start: 2021-04-03 — End: 2021-04-03

## 2021-04-03 MED ORDER — SENNA LEAF EXTRACT 176 MG/5 ML ORAL SYRUP
10.0000 mL | ORAL_SOLUTION | Freq: Two times a day (BID) | ORAL | Status: DC
Start: 2021-04-04 — End: 2021-04-06
  Administered 2021-04-04 (×2): 0 mg via ORAL
  Administered 2021-04-05 (×2): 352 mg via ORAL
  Filled 2021-04-03 (×3): qty 15

## 2021-04-03 MED ORDER — FENTANYL (PF) 50 MCG/ML INTRAVENOUS SOLUTION
INTRAVENOUS | Status: AC
Start: 2021-04-03 — End: 2021-04-03
  Administered 2021-04-03: 0.2 ug/kg/h via INTRAVENOUS
  Filled 2021-04-03: qty 50

## 2021-04-03 MED ORDER — LEVOTHYROXINE 88 MCG TABLET
88.0000 ug | ORAL_TABLET | Freq: Every morning | ORAL | Status: DC
Start: 2021-04-03 — End: 2021-04-03
  Administered 2021-04-03: 88 ug via GASTROSTOMY
  Filled 2021-04-03: qty 1

## 2021-04-03 MED ORDER — METOCLOPRAMIDE 5 MG/ML INJECTION SOLUTION
10.0000 mg | Freq: Once | INTRAMUSCULAR | Status: DC | PRN
Start: 2021-04-03 — End: 2021-04-03

## 2021-04-03 MED ORDER — DOCUSATE SODIUM 50 MG/5 ML ORAL LIQUID
100.0000 mg | Freq: Two times a day (BID) | ORAL | Status: DC
Start: 2021-04-03 — End: 2021-04-03
  Administered 2021-04-03: 0 mg via GASTROSTOMY

## 2021-04-03 MED ORDER — SODIUM CHLORIDE 0.9 % IV BOLUS
40.0000 mL | INJECTION | Freq: Once | Status: AC | PRN
Start: 2021-04-03 — End: 2021-04-03

## 2021-04-03 MED ORDER — FENTANYL (PF) 50 MCG/ML INJECTION SOLUTION
50.0000 ug | INTRAMUSCULAR | Status: DC | PRN
Start: 2021-04-03 — End: 2021-04-03

## 2021-04-03 MED ORDER — SODIUM CHLORIDE 0.9 % INTRAVENOUS SOLUTION
INTRAVENOUS | Status: DC | PRN
Start: 2021-04-03 — End: 2021-04-03

## 2021-04-03 MED ORDER — ELECTROLYTE-A INTRAVENOUS SOLUTION
INTRAVENOUS | Status: DC
Start: 2021-04-03 — End: 2021-04-03
  Administered 2021-04-03: 0 mL via INTRAVENOUS

## 2021-04-03 MED ORDER — ROCURONIUM 10 MG/ML INTRAVENOUS SOLUTION
INTRAVENOUS | Status: AC
Start: 2021-04-03 — End: 2021-04-03
  Filled 2021-04-03: qty 5

## 2021-04-03 MED ORDER — PROPOFOL 10 MG/ML INTRAVENOUS EMULSION
5.0000 ug/kg/min | INTRAVENOUS | Status: DC
Start: 2021-04-03 — End: 2021-04-03

## 2021-04-03 MED ORDER — PHENYLEPHRINE 1 MG/10 ML (100 MCG/ML) IN 0.9 % SOD.CHLORIDE IV SYRINGE
INJECTION | Freq: Once | INTRAVENOUS | Status: DC | PRN
Start: 2021-04-03 — End: 2021-04-03
  Administered 2021-04-03 (×3): 200 ug via INTRAVENOUS
  Administered 2021-04-03: 300 ug via INTRAVENOUS

## 2021-04-03 MED ORDER — PROPOFOL 10 MG/ML INTRAVENOUS EMULSION
INTRAVENOUS | Status: DC
Start: 2021-04-03 — End: 2021-04-03
  Filled 2021-04-03: qty 100

## 2021-04-03 MED ORDER — ROCURONIUM 10 MG/ML INTRAVENOUS SOLUTION
Freq: Once | INTRAVENOUS | Status: DC | PRN
Start: 2021-04-03 — End: 2021-04-03
  Administered 2021-04-03 (×2): 50 mg via INTRAVENOUS

## 2021-04-03 MED ORDER — HYDROMORPHONE 0.5 MG/0.5 ML INJECTION SYRINGE
0.5000 mg | INJECTION | INTRAMUSCULAR | Status: DC | PRN
Start: 2021-04-03 — End: 2021-04-03

## 2021-04-03 MED ORDER — SENNA LEAF EXTRACT 176 MG/5 ML ORAL SYRUP
10.0000 mL | ORAL_SOLUTION | Freq: Two times a day (BID) | ORAL | Status: DC
Start: 2021-04-03 — End: 2021-04-03
  Administered 2021-04-03: 0 mg via GASTROSTOMY

## 2021-04-03 MED ORDER — ONDANSETRON HCL (PF) 4 MG/2 ML INJECTION SOLUTION
4.0000 mg | INTRAMUSCULAR | Status: AC
Start: 2021-04-03 — End: 2021-04-03
  Administered 2021-04-03: 4 mg via INTRAVENOUS
  Filled 2021-04-03: qty 2

## 2021-04-03 MED ORDER — NOREPINEPHRINE BITARTRATE 16 MG/250 ML (64 MCG/ML) IN 0.9 % NACL IV
INTRAVENOUS | Status: DC
Start: 2021-04-03 — End: 2021-04-03
  Filled 2021-04-03: qty 250

## 2021-04-03 MED ORDER — CALCIUM CHLORIDE 100 MG/ML (10 %) INTRAVENOUS SYRINGE
INJECTION | INTRAVENOUS | Status: AC
Start: 2021-04-03 — End: 2021-04-03
  Filled 2021-04-03: qty 10

## 2021-04-03 MED ORDER — SODIUM BICARBONATE 8.4 % (1 MEQ/ML) INTRAVENOUS SYRINGE
INJECTION | Freq: Once | INTRAVENOUS | Status: DC | PRN
Start: 2021-04-03 — End: 2021-04-03
  Administered 2021-04-03 (×2): 50 meq via INTRAVENOUS

## 2021-04-03 MED ORDER — PHENYLEPHRINE 10 MG/ML INJECTION SOLUTION
INTRAMUSCULAR | Status: AC
Start: 2021-04-03 — End: 2021-04-03
  Filled 2021-04-03: qty 1

## 2021-04-03 MED ORDER — PANTOPRAZOLE 40 MG INTRAVENOUS SOLUTION
40.0000 mg | Freq: Every day | INTRAVENOUS | Status: DC
Start: 2021-04-03 — End: 2021-04-03

## 2021-04-03 MED ORDER — MIDAZOLAM 1 MG/ML INJECTION SOLUTION
INTRAMUSCULAR | Status: AC
Start: 2021-04-03 — End: 2021-04-03
  Filled 2021-04-03: qty 2

## 2021-04-03 MED ORDER — RACEPINEPHRINE 2.25 % SOLUTION FOR NEBULIZATION
0.5000 mL | INHALATION_SOLUTION | Freq: Once | RESPIRATORY_TRACT | Status: DC | PRN
Start: 2021-04-03 — End: 2021-04-03

## 2021-04-03 MED ORDER — ONDANSETRON HCL (PF) 4 MG/2 ML INJECTION SOLUTION
4.0000 mg | Freq: Once | INTRAMUSCULAR | Status: DC | PRN
Start: 2021-04-03 — End: 2021-04-03

## 2021-04-03 MED ORDER — THROMBIN(HUMAN)-FIBRINOGEN-APROTININ SYN-CALCIUM 10 ML TOPICAL SYRINGE
INJECTION | Freq: Once | CUTANEOUS | Status: DC | PRN
Start: 2021-04-03 — End: 2021-04-03
  Administered 2021-04-03: 10 mL via TOPICAL

## 2021-04-03 MED ORDER — CALCIUM CHLORIDE 100 MG/ML (10 %) INTRAVENOUS SYRINGE
INJECTION | Freq: Once | INTRAVENOUS | Status: DC | PRN
Start: 2021-04-03 — End: 2021-04-03
  Administered 2021-04-03: 1000 mg via INTRAVENOUS

## 2021-04-03 MED ORDER — LEVOTHYROXINE 88 MCG TABLET
88.0000 ug | ORAL_TABLET | Freq: Every morning | ORAL | Status: DC
Start: 2021-04-04 — End: 2021-04-12
  Administered 2021-04-04 – 2021-04-12 (×9): 88 ug via ORAL
  Filled 2021-04-03 (×9): qty 1

## 2021-04-03 MED ORDER — NOREPINEPHRINE BITARTRATE 16 MG/250 ML (64 MCG/ML) IN 0.9 % NACL IV
0.0300 ug/kg/min | INTRAVENOUS | Status: DC
Start: 2021-04-03 — End: 2021-04-04
  Administered 2021-04-03: 0.03 ug/kg/min via INTRAVENOUS
  Administered 2021-04-03 – 2021-04-04 (×2): 0 ug/kg/min via INTRAVENOUS
  Filled 2021-04-03: qty 250

## 2021-04-03 MED ORDER — FENTANYL (PF) 50 MCG/ML INJECTION SOLUTION
100.0000 ug | Freq: Once | INTRAMUSCULAR | Status: DC
Start: 2021-04-03 — End: 2021-04-03

## 2021-04-03 MED ORDER — LIDOCAINE 3.6 %-MENTHOL 1.25 % TOPICAL PATCH
1.0000 | MEDICATED_PATCH | Freq: Every day | CUTANEOUS | Status: DC
Start: 2021-04-03 — End: 2021-04-03

## 2021-04-03 MED ORDER — OXYCODONE 10 MG TABLET
10.0000 mg | ORAL_TABLET | ORAL | Status: DC | PRN
Start: 2021-04-03 — End: 2021-04-03

## 2021-04-03 MED ORDER — ETOMIDATE 2 MG/ML INTRAVENOUS SOLUTION
Freq: Once | INTRAVENOUS | Status: DC | PRN
Start: 2021-04-03 — End: 2021-04-03
  Administered 2021-04-03: 12 mg via INTRAVENOUS

## 2021-04-03 MED ORDER — OXYCODONE 5 MG TABLET
5.0000 mg | ORAL_TABLET | ORAL | Status: DC | PRN
Start: 2021-04-03 — End: 2021-04-29
  Administered 2021-04-05 – 2021-04-28 (×25): 5 mg via ORAL
  Filled 2021-04-03 (×25): qty 1

## 2021-04-03 MED ORDER — NALOXONE 1 MG/ML INJECTION SYRINGE
1.0000 mg | INJECTION | INTRAMUSCULAR | Status: DC | PRN
Start: 2021-04-03 — End: 2021-04-03

## 2021-04-03 MED ORDER — TRANEXAMIC ACID 1,000 MG/10 ML (100 MG/ML) INTRAVENOUS SOLUTION
1000.0000 mg | Freq: Once | INTRAVENOUS | Status: AC
Start: 2021-04-03 — End: 2021-04-03
  Administered 2021-04-03: 1000 mg via INTRAVENOUS
  Filled 2021-04-03: qty 10

## 2021-04-03 MED ORDER — METOCLOPRAMIDE 5 MG/ML INJECTION SOLUTION
10.0000 mg | Freq: Four times a day (QID) | INTRAMUSCULAR | Status: DC
Start: 2021-04-03 — End: 2021-04-03

## 2021-04-03 MED ORDER — HUM PROTHROMBIN CPLX (PCC) 4FACTOR 500 UNIT (400-620 UNIT) IV SOLUTION
3795.0000 [IU] | Freq: Once | INTRAVENOUS | Status: AC
Start: 2021-04-03 — End: 2021-04-03
  Administered 2021-04-03: 3795 [IU] via INTRAVENOUS
  Filled 2021-04-03 (×2): qty 3500

## 2021-04-03 MED ORDER — PHENYLEPHRINE 1 MG/10 ML (100 MCG/ML) IN 0.9 % SOD.CHLORIDE IV SYRINGE
INJECTION | INTRAVENOUS | Status: AC
Start: 2021-04-03 — End: 2021-04-03
  Filled 2021-04-03: qty 10

## 2021-04-03 MED ORDER — IOPAMIDOL 370 MG IODINE/ML (76 %) INTRAVENOUS SOLUTION
100.0000 mL | INTRAVENOUS | Status: AC
Start: 2021-04-03 — End: 2021-04-03
  Administered 2021-04-03: 100 mL via INTRAVENOUS

## 2021-04-03 MED ORDER — SODIUM CHLORIDE 0.9 % INTRAVENOUS SOLUTION
0.5000 ug/kg/h | INTRAVENOUS | Status: DC
Start: 2021-04-03 — End: 2021-04-03

## 2021-04-03 MED ORDER — FAMOTIDINE (PF) 20 MG/2 ML INTRAVENOUS SOLUTION
20.0000 mg | Freq: Every day | INTRAVENOUS | Status: DC
Start: 2021-04-03 — End: 2021-04-03

## 2021-04-03 MED ORDER — FENTANYL (PF) 50 MCG/ML INJECTION SOLUTION
100.0000 ug | Freq: Once | INTRAMUSCULAR | Status: AC
Start: 2021-04-03 — End: 2021-04-03

## 2021-04-03 MED ORDER — VASOPRESSIN 20 UNIT/ML INTRAVENOUS SOLUTION
Freq: Once | INTRAVENOUS | Status: DC | PRN
Start: 2021-04-03 — End: 2021-04-03
  Administered 2021-04-03: 4 [IU] via INTRAVENOUS
  Administered 2021-04-03: 6 [IU] via INTRAVENOUS
  Administered 2021-04-03 (×2): 4 [IU] via INTRAVENOUS
  Administered 2021-04-03: 2 [IU] via INTRAVENOUS
  Administered 2021-04-03: 4 [IU] via INTRAVENOUS

## 2021-04-03 MED ORDER — FENTANYL (PF) 50 MCG/ML INTRAVENOUS SOLUTION
0.2000 ug/kg/h | INTRAVENOUS | Status: DC
Start: 2021-04-03 — End: 2021-04-03
  Administered 2021-04-03: 0 ug/kg/h via INTRAVENOUS

## 2021-04-03 MED ORDER — ACETAMINOPHEN 325 MG TABLET
650.0000 mg | ORAL_TABLET | ORAL | Status: DC
Start: 2021-04-03 — End: 2021-04-03
  Administered 2021-04-03: 0 mg via GASTROSTOMY

## 2021-04-03 MED ORDER — PANTOPRAZOLE 40 MG TABLET,DELAYED RELEASE
40.0000 mg | DELAYED_RELEASE_TABLET | Freq: Two times a day (BID) | ORAL | Status: DC
Start: 2021-04-03 — End: 2021-04-03

## 2021-04-03 MED ORDER — HYDROMORPHONE 0.5 MG/0.5 ML INJECTION SYRINGE
0.2500 mg | INJECTION | INTRAMUSCULAR | Status: DC | PRN
Start: 2021-04-03 — End: 2021-04-03

## 2021-04-03 SURGICAL SUPPLY — 175 items
APPL 70% ISPRP 2% CHG 26ML 13._2X13.2IN CHLRPRP PREP DEHP-FR (MED SURG SUPPLIES)
APPL 70% ISPRP 2% CHG 26ML CHLRPRP HI-LT ORNG PREP STRL LF  DISP CLR (MED SURG SUPPLIES) IMPLANT
BARRIER SKIN 5X5IN 2.25IN 1.25_-1.75IN 2 PC HYDROCOLLOID COL (WOUND CARE/ENTEROSTOMAL SUPPLY)
BARRIER SKIN 5X5IN 2.25IN 2 PC HYDROCOLLOID COL TRKLNK MLD TO FIT OPN LRG SFN STOMAHESIVE LF  WHT (WOUND CARE SUPPLY) IMPLANT
BLANKET MISTRAL-AIR ADULT UPR BODY 79X29.9IN FRC AIR HI VOL BLWR INTUITIVE CONTROL PNL LRG LED (MED SURG SUPPLIES) ×2 IMPLANT
BLANKET MISTRAL-AIR UPR BODY 7_8.7X29.9IN FRC AIR WARM (MED SURG SUPPLIES) ×2
CATH URETH 18FR 17IN FOLEY 2W LRG OVAL DRAIN EYE BAL SIL ELASTO 10ML LF (UROLOGICAL SUPPLIES) IMPLANT
CATH URETH 18FR 17IN FOLEY 2W_LRG OVAL DRAIN EYE BAL SIL (UROLOGICAL SUPPLIES)
CIRCUIT BREATHING 2 LIMB 2 FILTER BRTH MASK GAS SAMPLE LINE 33-87IN ADULT 3L LF  PORTEX DISP STR WYE (RESPIRATORY/AIRWAY MGMT) ×1 IMPLANT
CIRCUIT BREATHING 2 LIMB 2 FLT_R BRTH MASK GAS SAMPLE LINE 33 (RESPIRATORY/AIRWAY MGMT) ×1
CLIP LIGACLIP TI LRG LT400_18EA/BX (WOUND CARE/ENTEROSTOMAL SUPPLY)
CLIP LIGACLIP TI MED LT200_BX/36 (WOUND CARE/ENTEROSTOMAL SUPPLY)
CLIP LRG INTERNAL LIGACLIP X TI LGT OPN STRL DISP LF  YW (WOUND CARE SUPPLY) IMPLANT
CLIP MED 3.2MM INTERNAL LIGACLIP X TI LGT OPN STRL DISP ENDOS LF  SILVER (WOUND CARE SUPPLY) IMPLANT
CONV USE 117716 - SYRINGE 10ML LF  STRL LL MED (MED SURG SUPPLIES) IMPLANT
CONV USE 337901 - PACK SURG LAP GN STRL DISP LF (CUSTOM TRAYS & PACK) ×1
CONV USE ITEM 308913 - GLOVE SURG 8.5 LF PF BEAD CUF_SMTH STRL BRN TINT 12IN (GLOVES AND ACCESSORIES) ×1
CONV USE ITEM 321830 - GLOVE SURG 8.5 LF PF BEAD CUF_SMTH STRL BRN TINT 12IN (GLOVES AND ACCESSORIES) ×1 IMPLANT
CONV USE ITEM 321835 - GLOVE SURG 6.5 LF  PF STRL DISP (GLOVES AND ACCESSORIES) ×3 IMPLANT
CONV USE ITEM 321836 - GLOVE SURG 7 LF  BEAD CUF SENSOPRENE NPRN (GLOVES AND ACCESSORIES) ×1 IMPLANT
CONV USE ITEM 321851 - GLOVE SURG 7.5 LF  BEAD CUF DERMASHIELD PLISPRN (GLOVES AND ACCESSORIES) ×2 IMPLANT
CONV USE ITEM 321852 - GLOVE SURG 6.5 LF  PLISPRN (GLOVES AND ACCESSORIES) ×1 IMPLANT
CONV USE ITEM 321863 - GLOVE SURG 6.5 LF PF SMOOTH STRL GRN  PLISPRN MICRO (GLOVES AND ACCESSORIES) ×2 IMPLANT
CONV USE ITEM 321982 - GLOVE SURG 7 LTX CHEMO PF SMOOTH BEAD CUF STRL WHT 11.6IN PLMR THK.2MM THK.21MM (GLOVES AND ACCESSORIES) ×2 IMPLANT
DCNTR FLUID TRANSF DEV (MED SURG SUPPLIES) ×1 IMPLANT
DEVICE FLUID TRANSF ASPT LF (MED SURG SUPPLIES) ×1
DISCONTINUED USE ITEM 329606 - SUTURE 4-0 PS2 ETHILON MTPS 18IN BLK MONOF NONAB (SUTURE/WOUND CLOSURE) ×1 IMPLANT
DISCONTINUED USE ITEM 97903 - SUTURE 3-0 SH VICRYL 27IN UNDYED BRD COAT ABS (SUTURE/WOUND CLOSURE) ×1 IMPLANT
DISCONTINUED USE ITEM 97927 - SUTURE 3-0 SH-1 VICRYL 18IN VIOL CR BRD 8 STRN COAT ABS (SUTURE/WOUND CLOSURE) ×2 IMPLANT
DRAIN BLAKE 19FR W/O TROCAR_2230 (MED SURG SUPPLIES) ×1
DRAIN INCS 15FR JP SIL RADOPQ END PRFR STRL LF  RND DISP (MED SURG SUPPLIES) IMPLANT
DRAIN INCS 19FR BLAK SIL 4 CHNL RADOPQ HBLS SLD COR CNTR STRL RND DISP WHT (MED SURG SUPPLIES) ×1 IMPLANT
DRAIN SILI RND 15FR_SU1301323 (MED SURG SUPPLIES)
DRESS 11YDX6IN MDFX RTNT SHT_LO SNS ADH WTR RS NWVN WND LF (WOUND CARE/ENTEROSTOMAL SUPPLY) ×1
DRESS 14X4IN GAUZE PAD WTPRF NWVN ADH DISP STRL LF  12X2IN (WOUND CARE SUPPLY) IMPLANT
DRESS 14X4IN GAUZE PAD WTPRF N_WVN ADH DISP STRL LF 12X2IN (WOUND CARE/ENTEROSTOMAL SUPPLY)
DRESS HMST KLN STRL LF  4 PLY NWVN RADOPQ SFT QUIKCLOT 4X4IN WHT (MED SURG SUPPLIES) IMPLANT
DRESS TRNSPR 10X4IN POLYUR ADH HYPOALL WTPRF TGDRM STRL LF (WOUND CARE SUPPLY) ×1 IMPLANT
DRESS WOUND 10X4IN STRSRB POLYUR 4 LYR ABS NONADH NWVN LF  8X2IN (WOUND CARE SUPPLY) ×1 IMPLANT
DRESS WOUND 10X4IN STRSRB POLY_UR 4 LYR ABS NONADH NWVN LF 8 (WOUND CARE/ENTEROSTOMAL SUPPLY) ×1
DRESSING QUIKCLOT 4IN X 4IN_459 10EA/BX HEMOSTATIC (MED SURG SUPPLIES)
DRESSING TRNSPR FILM 4 X 10IN_1627 TEGADERM LF STRL 20/BX (WOUND CARE/ENTEROSTOMAL SUPPLY) ×1
DSCT LAPSCP CURVE JAW CRDLS US 26CM SONICISION (ENDOSCOPIC SUPPLIES) IMPLANT
DSCT LAPSCP CURVE JAW CRDLS US_26CM SONICISION (INSTRUMENTS ENDOMECHANICAL)
DSCT LAPSCP CURVE JAW_CRDLS US 13CM SONICISION (ENDOSCOPIC SUPPLIES) ×1 IMPLANT
DSCT LAPSCP CURVE JAW_CRDLS US 13CM SONICISION (INSTRUMENTS ENDOMECHANICAL) ×1
ELECTRODE ESURG BLADE 6.5IN 3/32IN EDGE STRL .2IN DISP INSL STD SHAFT XTD LF (SURGICAL CUTTING SUPPLIES) ×1 IMPLANT
ELECTRODE ESURG BLADE 6.5IN 3/_32IN EDGE STRL .2IN DISP INSL (CUTTING ELEMENTS) ×1
ELECTRODE ESURG BLADE 6.5IN EDGE LF (SURGICAL CUTTING SUPPLIES) IMPLANT
ELECTRODE ESURG XTD BLADE 6.5I_N 3/32IN EDGE STRL DISP INSL (CUTTING ELEMENTS)
ELECTRODE PATIENT RTN 9FT VLAB C30- LB RM PHSV ACRL FOAM CORD NONIRRITATE NONSENSITIZE ADH STRP (SURGICAL CUTTING SUPPLIES) ×1 IMPLANT
ELECTRODE PATIENT RTN 9FT VLAB_REM C30- LB PLHSV ACRL FOAM (CUTTING ELEMENTS) ×1
GARMENT COMPRESS MED CALF CENTAURA NYL VASOGRAD LTWT BRTHBL SEQ FIL BLU 18- IN (MED SURG SUPPLIES) ×1 IMPLANT
GARMENT COMPRESS MED CALF CENT_AURA NYL VASOGRAD LTWT BRTHBL (MED SURG SUPPLIES) ×2
GARMENT COMPRESS MED THG CENTA_URA NYL VASOGRAD LTWT BRTHBL (MED SURG SUPPLIES) IMPLANT
GLOVE SURG 6.5 LF  PF SMOOTH STRL GRN  PLISPRN MICRO (GLOVES AND ACCESSORIES) ×2
GLOVE SURG 6.5 LF PF SMOOTH STRL WHT PLISPRN (GLOVES AND ACCESSORIES) ×1
GLOVE SURG 6.5 LTX PF SMOOTH STRL CRM (GLOVES AND ACCESSORIES) ×3
GLOVE SURG 7 LF  PF SMOOTH STRL CRM  NPRN (GLOVES AND ACCESSORIES) ×1
GLOVE SURG 7 LTX PF SMOOTH STRL CRM (GLOVES AND ACCESSORIES) ×2
GLOVE SURG 7.5 LF  PF SMOOTH STRL GRN  PLISPRN MICRO (GLOVES AND ACCESSORIES) ×2
GOWN SURG 2XL AAMI L3 REINF HK_LP CLSR SET IN SLEEVE STRL LF (DRAPE/PACKS/SHEETS/OR TOWEL) ×2
GOWN SURG 2XL L3 REINF HKLP CLSR SET IN SLEEVE STRL LF  DISP BLU SIRUS SMS 49IN (DRAPE/PACKS/SHEETS/OR TOWEL) ×2 IMPLANT
GOWN SURG LRG AAMI L3 REINF HK_LP CLSR SET IN SLEEVE STRL LF (DRAPE/PACKS/SHEETS/OR TOWEL) ×1
GOWN SURG LRG AAMI L4 IMPRV SL_EEVE REINF BRTHBL STRL LF (DRAPE/PACKS/SHEETS/OR TOWEL) ×1
GOWN SURG LRG L3 REINF HKLP CLSR SET IN SLEEVE STRL LF  DISP BLU SIRUS SMS 43IN (DRAPE/PACKS/SHEETS/OR TOWEL) ×1 IMPLANT
GOWN SURG LRG L4 IMPRV REINF BRTHBL STRL LF  DISP BLU AURR PE 43IN (DRAPE/PACKS/SHEETS/OR TOWEL) ×1 IMPLANT
HANDPC SUCT MEDIVAC YANKAUER BLBS TIP CLR STRL LF  DISP (MED SURG SUPPLIES) ×1 IMPLANT
HANDPC SUCT MEDIVAC YANKAUER B_LBS TIP CLR STRL LF DISP (MED SURG SUPPLIES) ×1
HEMOSTAT ABS 14X2IN FLXB SHR W_V SRGCL STRL DISP (WOUND CARE SUPPLY) IMPLANT
HEMOSTAT ABS 14X2IN FLXB SHR W_V SRGCL STRL DISP (WOUND CARE/ENTEROSTOMAL SUPPLY)
HEMOSTAT ABS BIONERT ARISTA AH MPH PLNT STRH 3GM STRL (WOUND CARE SUPPLY) ×2 IMPLANT
HEMOSTAT ABS BIONERT ARISTA AH_MPH PLNT STRH 3GM (WOUND CARE/ENTEROSTOMAL SUPPLY) ×2
INSTRUMENT SUC POOLE TIP 50180_DYND50180 50EA/CS (MED SURG SUPPLIES) ×2
KIT CATH ARGD BLU + 7FR 20CM CHG LDCN PHTHALATE DEHP PRESS INJ 3 LUM ANTIMIC LF (IV TUBING & ACCESSORIES) IMPLANT
KIT CATH ARGD BLU + 7FR 20CM C_HG LDCN PHTHALATE DEHP PRESS (IV TUBING & ACCESSORIES)
KIT CATH MC AR ADV 9FR 18GA 11_.5CM 2.5IN .035IN POLYUR 2 LUM (IV TUBING & ACCESSORIES) ×1
METER URINE EZ-LOK DRAIN BAG 350ML 2.5L STRL LF  DISP (UROLOGICAL SUPPLIES) IMPLANT
METER URN BARD EZ-LOK SP DRAIN_BAG 350ML 2.5L STRL LF DISP (UROLOGICAL SUPPLIES)
PACK LAP GENERAL CUSTOM_DYNJ36227C 2EA/CS (CUSTOM TRAYS & PACK) ×1
PACK SURG LAP GN STRL DISP LF (CUSTOM TRAYS & PACK) ×1 IMPLANT
PAD ABDOMINAL 7.5X8 STRL (WOUND CARE/ENTEROSTOMAL SUPPLY) ×1
PAD ABDOMINAL 8X7.5IN LF  STRL (WOUND CARE SUPPLY) ×1 IMPLANT
PAD INST MAGNETIC MED ST_CS/48 20016B (DRAPE/PACKS/SHEETS/OR TOWEL) ×1
PAD MED MAG INSTR STRL (DRAPE/PACKS/SHEETS/OR TOWEL) ×1 IMPLANT
POUCH 12IN 2.25IN OSTOMY 1 SD COMFORT PNL TAIL CLSR FLANGE TRNSPR LF  SFN NTR + INVISICLOSE LOCK-IT (WOUND CARE SUPPLY) IMPLANT
POUCH 12IN 2.25IN OSTOMY 1 SD_COMFORT PNL TAIL CLSR FLANGE (WOUND CARE/ENTEROSTOMAL SUPPLY)
RELOAD ENDO GIA ARTIC VASC 45_BX/6 EGIA45AV GRAY (SUTURE AIDS)
RELOAD STPLR 2MM 45MM EGIA TI THN VAS TISS ARTC KNIFE BLADE LGR ANVIL BCKT STRL LF  DISP GRY (SUTURE AIDS) IMPLANT
RELOAD STPLR 60MMX3.8MM GIA TI REG TISS 4 ROW LNR CTR KNIFE BLADE STPL GAP CONTROL STRL 1.5MM LF (SUTURE AIDS) IMPLANT
RELOAD STPLR 80MMX3.8MM GIA TI REG TISS 4 ROW LNR CTR KNIFE BLADE STPL GAP CONTROL STRL 1.5MM LF (SUTURE AIDS) IMPLANT
RELOAD STPLR 90MMX3.5MM TA TI REG TISS 2 ROW LNR CTR 2 ACT HNDL MANUAL PIN PLC STRL 1.5MM LF  DISP (SUTURE AIDS) IMPLANT
RESERVOIR DRAIN SIL JP BULB 100CC STRL LF  DISP (MED SURG SUPPLIES) IMPLANT
RESERVOIR DRAIN SIL JP BULB 10_0CC STRL LF DISP (MED SURG SUPPLIES)
RESUS MANUAL ADLT MASK PEEP VA_LVE O2 RESERVOIR BAG AMBU SP2 (RESPIRATORY/AIRWAY MGMT) ×1
RESUS MANUAL ADULT MASK PEEP VALVE O2 RESERVOIR BAG AMBU SP2 SAFEGRIP PVC PE SEBS SBR (RESPIRATORY/AIRWAY MGMT) ×1 IMPLANT
RETAINER 9 5/8INX6 3/8IN LRG VSCR STRL LF  DISP FISH GLSMN INTERNAL ORG RADOPQ (MED SURG SUPPLIES) ×1 IMPLANT
RETAINER 9 5/8INX6 3/8IN LRG V_SCR STRL LF DISP FISH GLSMN (MED SURG SUPPLIES) ×1
SET ADMIN 104IN 60 GTT/ML IV CLRLNK CNT-FL 2 LL ACT BKCHK VALVE RETRACT COL VENT DEHP STRL LF (IV TUBING & ACCESSORIES) ×1 IMPLANT
SET ADMIN 104IN 60 GTT/ML IV C_LRLNK CNT-FL 2 LL ACT BKCHK (IV TUBING & ACCESSORIES) ×1
SET ADMIN 105IN 10 GTT/ML IV CLRLNK CNT-FL DUOVENT 3 LL ACT BKCHK VALVE RETRACT COL DEHP STRL LF (IV TUBING & ACCESSORIES) ×1 IMPLANT
SET ADMIN 105IN 10 GTT/ML IV C_LRLNK CNT-FL DUOVENT 3 LL ACT (IV TUBING & ACCESSORIES) ×1
SET CATH 9FR 11.5CM MAC CTHGRD CEN VEN 2 LUM LF  7.5-8FR CATH (IV TUBING & ACCESSORIES) ×1 IMPLANT
SOL IRRG 0.9% NACL 1000ML PLASTIC PR BTL ISTNC N-PYRG STRL LF (MEDICATIONS/SOLUTIONS) ×4 IMPLANT
SOL IV 0.9% NACL 100ML PH 5 PLASTIC CONTAINR PRSV FR VIAFLEX (MEDICATIONS/SOLUTIONS) ×1 IMPLANT
SOL IV 0.9% NACL 500ML PLASTIC CONTAINR VIAFLEX LF (MEDICATIONS/SOLUTIONS) ×1 IMPLANT
SOL IV 0.9% NACL PVC 1000ML PH 5 PLASTIC CONTAINR PRSV FR VIAFLEX LF (MEDICATIONS/SOLUTIONS) ×3 IMPLANT
SOLUTION IRRG NS 2F7124 1000CC_12/CS (MEDICATIONS/SOLUTIONS) ×4
SOLUTION IV NS 1000CC 2B1324X_14/CS (MEDICATIONS/SOLUTIONS) ×3
SOLUTION IV NS 100CC 2B1307_SINGLE PACK 96EA/CS (MEDICATIONS/SOLUTIONS) ×1
SOLUTION IV NS INJ 500CC_2B1323Q 24/CS (MEDICATIONS/SOLUTIONS) ×1
SPONGE DRESSING XTAC 17INX17IN (WOUND CARE SUPPLY) IMPLANT
SPONGE DRESSING XTAC 17INX17IN (WOUND CARE/ENTEROSTOMAL SUPPLY)
SPONGE GAUZE 3X3IN COTTON 12 PLY TY 7 LF  STRL DISP WHT (WOUND CARE SUPPLY) ×1 IMPLANT
SPONGE GAUZE 4X4IN MDCHC COTTON 12 PLY TY 7 LF  STRL DISP (WOUND CARE SUPPLY) ×2 IMPLANT
SPONGE GAUZE 4X4IN MDCHC COTTO_N 12 PLY TY 7 LF STRL DISP (WOUND CARE/ENTEROSTOMAL SUPPLY) ×2
SPONGE GAUZE TYPE 7 3X3 12 PLY_MEDC (WOUND CARE/ENTEROSTOMAL SUPPLY) ×1
SPONGE LAP 18 X 18IN STRL_AL1818 5EA/PK 40PK/CS (MED SURG SUPPLIES) ×2
SPONGE LAP 18X18IN STRL (MED SURG SUPPLIES) ×2 IMPLANT
SPONGE LAP 36X18IN 4 PLY RADOPQ PREWASH LOOP COTTON STRL LF  7IN (MED SURG SUPPLIES) ×3 IMPLANT
SPONGE LAP 36X18IN 4 PLY RADOP_Q PREWASH LOOP COTTON STRL LF (MED SURG SUPPLIES) ×3
STAPLER ENDO GIA ULTRA 12MM_EGIAUSHORT UNIV 3EA/BX (SUTURE AIDS)
STAPLER INTERNAL 6CMX4MM TI SHORT UNIV THN VAS TISS ARTC HNDL LRG BCKT STRL LF  DISP EGIA ENDOS 12MM (SUTURE AIDS) IMPLANT
STAPLER INTERNAL 80MMX3.8MM TI SS TEARDRP REG TISS 4 ROW LNR CTR QUICK RELEASE BUTTON ADJ FRNG KNOB (SUTURE AIDS) IMPLANT
STAPLER INTERNAL 90MMX3.5MM TI REG TISS 2 ROW LNR CTR 2 ACT HNDL RLD STRL LF  DISP TA BLU ENDOS 1.5 (SUTURE AIDS) ×1 IMPLANT
STAPLER M/FIRE GIA 80 3.8MM_GIA8038S BX/3 (SUTURE AIDS)
STAPLER M/FIRE PREM TA90 3.5_TA9035S 3/BX (SUTURE AIDS) ×1
STAPLER SKIN 4.1X6.5MM 35 W STPL CART LF  APS U DISP CLR SS PLASTIC (SUTURE/WOUND CLOSURE) ×1 IMPLANT
STAPLER SKIN WIDE 35W_8886803712 12/BX (SUTURE/WOUND CLOSURE) ×1
STETH ESOPH 18FR TEMP SENSOR REG TUBE THN DIST CUF POS LOCK CONN 400 SER STRL LF (RESPIRATORY/AIRWAY MGMT) ×1 IMPLANT
STETH ESOPH 18FR TEMP SENSOR R_EG TUBE OPTM SND 400 SER LF (RESPIRATORY/AIRWAY MGMT) ×1
STYLET INTUBATE 45CM 14FR METAL PLASTIC RU FLXSLP MLBL COR RETRACT TIP CLR CD STRL LF  DISP 7-10MM (RESPIRATORY/AIRWAY MGMT) ×1 IMPLANT
STYLET INTUBATING FLEXSLIP 14F_502507 14FR 20EA/BX (RESPIRATORY/AIRWAY MGMT) ×1
SUTURE 1 CTX PDS2 36IN VIOL MONOF ABS (SUTURE/WOUND CLOSURE) ×2 IMPLANT
SUTURE 1 CTX PDS2 36IN VIOL MO_NOF ABS (SUTURE/WOUND CLOSURE) ×2
SUTURE 1 TP-1 PDS2 96IN VIOL MONOF LOOP ABS (SUTURE/WOUND CLOSURE) IMPLANT
SUTURE 1 TP-1 PDS2 96IN VIOL M_ONOF LOOP ABS (SUTURE/WOUND CLOSURE)
SUTURE 3-0 RB1 PROLENE 30IN BLU MONOF NONAB (SUTURE/WOUND CLOSURE) IMPLANT
SUTURE 3-0 RB1 PROLENE 30IN BL_U MONOF NONAB (SUTURE/WOUND CLOSURE)
SUTURE 3-0 SH VICRYL 27IN UNDYED BRD COAT ABS (SUTURE/WOUND CLOSURE) ×1
SUTURE 3-0 SH VICRYL 27IN UNDY_ED BRD COAT ABS (SUTURE/WOUND CLOSURE) ×1
SUTURE 3-0 SH-1 VICRYL 18IN VI_OL CR BRD 8 STRN COAT ABS (SUTURE/WOUND CLOSURE) ×2
SUTURE 3-0 SH-1 VICRYL 27IN UNDYED BRD COAT ABS (SUTURE/WOUND CLOSURE) ×4 IMPLANT
SUTURE 3-0 SH-1 VICRYL 27IN UN_DYED BRD COAT ABS (SUTURE/WOUND CLOSURE) ×4
SUTURE 3-0 V-7 PROLENE 36IN BL_U 2 ARM MONOF NONAB (SUTURE/WOUND CLOSURE)
SUTURE 4-0 PS2 ETHILON MTPS 18_IN BLK MONOF NONAB (SUTURE/WOUND CLOSURE) ×1
SUTURE POLYPROP 3-0 V-7 PROLENE 36IN BLU 2 ARM MONOF NONAB (SUTURE/WOUND CLOSURE) IMPLANT
SUTURE SILK 0 PSL PERMAHAND 18IN BLK BRD NONAB (SUTURE/WOUND CLOSURE) IMPLANT
SUTURE SILK 0 PSL PERMAHAND 18_IN BLK BRD NONAB (SUTURE/WOUND CLOSURE)
SUTURE SILK 2-0 PERMAHAND 30IN BLK BRD TIE 12 STRN PCUT NONAB (SUTURE/WOUND CLOSURE) ×1 IMPLANT
SUTURE SILK 2-0 PERMAHAND 30IN_BLK BRD TIE 12 STRN PCUT (SUTURE/WOUND CLOSURE) ×1
SUTURE SILK 2-0 SH PERMAHAND 30IN BLK BRD NONAB (SUTURE/WOUND CLOSURE) ×2 IMPLANT
SUTURE SILK 2-0 SH PERMAHAND 3_0IN BLK BRD NONAB (SUTURE/WOUND CLOSURE) ×2
SUTURE SILK 3-0 PERMAHAND 30IN BLK BRD TIE 12 STRN PCUT NONAB (SUTURE/WOUND CLOSURE) ×1 IMPLANT
SUTURE SILK 3-0 PERMAHAND 30IN_BLK BRD TIE 12 STRN PCUT (SUTURE/WOUND CLOSURE) ×1
SUTURE SILK 3-0 SH PERMAHAND 18IN BLK CR BRD 8 STRN NONAB (SUTURE/WOUND CLOSURE) ×1 IMPLANT
SUTURE SILK 3-0 SH PERMAHAND 1_8IN BLK CR BRD 8 STRN NONAB (SUTURE/WOUND CLOSURE) ×1
SUTURE SILK 3-0 SH PERMAHAND 30IN BLK BRD NONAB (SUTURE/WOUND CLOSURE) ×1 IMPLANT
SUTURE SILK 3-0 SH PERMAHAND 3_0IN BLK BRD NONAB (SUTURE/WOUND CLOSURE) ×1
SYRINGE 10ML LF STRL LL MED (MED SURG SUPPLIES)
TAPE DRESS MDFX 11YDX6IN WTPRF RETENTION SHEET NWVN LF  STRL (WOUND CARE SUPPLY) ×1 IMPLANT
TIP SUCT YANKAUER PL STD RIGID LRG LUM BVL SLEEVE OPN STRL LF (MED SURG SUPPLIES) ×2 IMPLANT
TRAY CATH 16FR 1 LYR URMTR DRAIN BAG 2W SIL PVP ADULT 2.5L (UROLOGICAL SUPPLIES) ×1
TRAY CATH 16FR 1 LYR URMTR DRAIN BAG 2W SIL PVP ADULT 2.5L LTX (UROLOGICAL SUPPLIES) ×1 IMPLANT
TUBE ET 7.5MM NASAL ORAL MRPH EYE CLOSE FIT LOW PRESS OPQ CUF SHRD CF PVC STRL LTX DISP (RESPIRATORY/AIRWAY MGMT) ×1 IMPLANT
TUBE ET SHRD CF 7.5MM MRPH EYE_CLOSE FIT LOW PRESS OPQ CUF (RESPIRATORY/AIRWAY MGMT) ×1
TUBE NG 16FR 48IN SALEM SUMP PVC INT FNL CONN 2 LUM 5IN 1 ADPR STRL LF (MED SURG SUPPLIES) ×1 IMPLANT
TUBE NG 16FR 48IN SALEM SUMP P_VC INT FNL CONN 2 LUM 5IN 1 (MED SURG SUPPLIES) ×1
TUBE SUCT CONNECT 1/4INX20_N620A 20EA/CS (MED SURG SUPPLIES) ×1
TUBING SUCT CLR 20FT .25IN MEDIVAC MXGR RIGID MALE TO MALE CONN STRL LF  DISP (MED SURG SUPPLIES) ×1 IMPLANT
UNIT LOADING M/FIRE 90 3.5 DLU_PREM TA90 TA9035L 6/BX (SUTURE AIDS)
UNIT LOADING M/FIRE GIA 60 3.8_GIA6038L BX/6 (SUTURE AIDS)
UNIT LOADING M/FIRE GIA 80 3.8_GIA8038L (SUTURE AIDS)
WATER STRL 1000ML PLASTIC PR BTL LF (MED SURG SUPPLIES) IMPLANT
WATER STRL 1000ML PLASTIC PR B_TL LF (MED SURG SUPPLIES)

## 2021-04-03 NOTE — Consults (Signed)
ICU consult note    Patient Name: [name]  Date: 04/03/2021  Department:  [place]  MRN: [id number]  DOB: July 03, 1945    Abdominal pain     History of Present Illness:  TC KAPUSTA is a 76 y.o., White male with past medical history of  IPMN, cellulitis, abscess of the left growing, chronic renal failure, dyslipidemia, thyroid disorder, malignant neoplasm of prostate, GERD, depression, symptomatic HIV infection who presents with abdominal pain of 2 days duration.     Patient has recent exploratory laparotomy and distal pancreatectomy and splenectomy for IPMN lesion at [facility].  Postoperatively he developed pulmonary embolism and started with Eliquis.  Patient started to have abdominal pain for which he came to the emergency room.  The pain reported for about 2 days.    CT abdomen show active contrast extravasation at the surgical blade.  General surgery was consulted from the emergency room and patient taken for surgery.  The concern was intraperitoneal bleed.  It was found to have a small bleeding vessel at the pancreatic bed with diffuse oozing of the pancreas.  Patient underwent exploratory laparotomy with ligation of the bleeding vessel.  Patient brought to the ICU post surgery.  Patient received massive blood transfusion protocol.     Medication History    Current Facility-Administered Medications   Medication Dose Route Frequency   . fentaNYL (PF) (SUBLIMAZE) 2,500 mcg in NS 250 mL (tot vol) infusion  0.5 mcg/kg/hr (Adjusted) Intravenous Continuous   . fentaNYL (SUBLIMAZE) 50 mcg/mL injection  100 mcg Intravenous Once   . fentaNYL (SUBLIMAZE) 50 mcg/mL injection  50 mcg Intravenous Q15 Min PRN   . fibrin sealant component (TISSEEL VHSD) 4 mL topical syringe - FROZEN    One-Step Med: Once PRN   . fibrin sealant component (TISSEEL) 10 mL topical syringe - FROZEN    One-Step Med: Once PRN   . HYDROmorphone (DILAUDID) 0.5 mg/0.5 mL injection  0.25 mg  Intravenous PACU Q10 Min PRN   . HYDROmorphone (DILAUDID) 0.5 mg/0.5 mL injection  0.5 mg Intravenous PACU Q10 Min PRN   . ipratropium-albuterol 0.5 mg-3 mg(2.5 mg base)/3 mL Solution for Nebulization  3 mL Nebulization PACU Once PRN   . LR premix infusion   Intravenous PACU Continuous   . metoclopramide (REGLAN) 5 mg/mL injection  10 mg Intravenous PACU Once PRN   . nalOXone (NARCAN) 1 mg/mL injection  1 mg Intravenous Q2 MIN PRN   . norepinephrine (LEVOPHED) 16 mg in NS 233m premix infusion  0.03 mcg/kg/min Intravenous Continuous   . norepinephrine (LEVOPHED) 16 mg/250 mL (64 mcg/mL) premix infusion ---Cabinet Override       . NS bolus infusion 40 mL  40 mL Intravenous Once PRN   . NS flush syringe  3 mL Intracatheter Q8HRS   . NS flush syringe  3 mL Intracatheter Q1H PRN   . NS flush syringe  3 mL Intracatheter Q8HRS   . NS flush syringe  3 mL Intracatheter Q1H PRN   . ondansetron (ZOFRAN) 2 mg/mL injection  4 mg Intravenous PACU Once PRN   . propofoL (DIPRIVAN) 10 mg/mL premix infusion ---Cabinet Override       . propofol (DIPRIVAN) 10 mg/mL premix infusion  5 mcg/kg/min (Adjusted) Intravenous Continuous   . racepinephrine 2.25% nebulizer solution  0.5 mL Nebulization PACU Once PRN       Past Surgical History  Past Surgical History:   Procedure Laterality Date   .  COLON SURGERY      per patient had part of colon removed and had a colostomy   . COLONOSCOPY     . GASTROSCOPY     . 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 Parke Simmers   . [place]  2001   . HX COLOSTOMY REVERSAL     . HX HERNIA REPAIR     . KNEE SURGERY Bilateral    . WRIST SURGERY Right            Family History   Family Medical History:     Problem Relation (Age of Onset)    Cancer Mother, Father, Other    Heart Attack Mother    High Cholesterol Other    Stroke Other            Social History  Social History     Socioeconomic History   . Marital status: Married    Tobacco Use   . Smoking status: Never   . Smokeless tobacco: Never   Vaping Use   . Vaping Use: Never used   Substance and Sexual Activity   . Alcohol use: Not Currently   . Drug use: Never   Other Topics Concern   . Ability to Walk 1 Flight of Steps without SOB/CP Yes   . Routine Exercise Yes   . Ability to Walk 2 Flight of Steps without SOB/CP Yes   . Unable to Ambulate No   . Total Care No   . Ability To Do Own ADL's Yes   . Uses Walker No   . Other Activity Level Yes   . Uses Cane No        Alcohol use   Social History     Substance and Sexual Activity   Alcohol Use Not Currently       Tobacco use    Social History     Tobacco Use   Smoking Status Never   Smokeless Tobacco Never          REVIEW OF SYSTEMS:  Unable to get review of system because patient is sedated and on a ventilator    ROS      Imaging:        Labs:  Results for orders placed or performed during the hospital encounter of 04/03/21 (from the past 24 hour(s))   POC BLOOD GLUCOSE (RESULTS)   Result Value Ref Range    GLUCOSE, POC 239 Fasting: 80-130 mg/dL; 2 HR PC: <180 mg/dL mg/dl   POC BLOOD GLUCOSE (RESULTS)   Result Value Ref Range    GLUCOSE, POC 216 Fasting: 80-130 mg/dL; 2 HR PC: <180 mg/dL mg/dl   TROPONIN-I   Result Value Ref Range    TROPONIN I 22 <=30 ng/L   CBC WITH DIFF   Result Value Ref Range    WBC 25.1 (H) 3.7 - 11.0 x10^3/uL    RBC 4.23 (L) 4.50 - 6.10 x10^6/uL    HGB 12.5 (L) 13.4 - 17.5 g/dL    HCT 38.2 (L) 38.9 - 52.0 %    MCV 90.3 78.0 - 100.0 fL    MCH 29.6 26.0 - 32.0 pg    MCHC 32.7 31.0 - 35.5 g/dL    RDW-CV 14.6 11.5 - 15.5 %    PLATELETS 193 150 - 400 x10^3/uL    MPV 10.2 8.7 - 12.5 fL   MANUAL DIFF AND MORPHOLOGY-SYSMEX   Result Value Ref  Range    NEUTROPHIL % 77 %    LYMPHOCYTE %  4 %    MONOCYTE % 10 %    EOSINOPHIL % 0 %    BASOPHIL % 0 %    NEUTROPHIL BANDS % 7 %    METAMYELOCYTE %  2 %    NEUTROPHIL # 21.08 (H) 1.50 - 7.70 x10^3/uL    LYMPHOCYTE # 1.00 1.00 - 4.80 x10^3/uL    MONOCYTE # 2.51 (H) 0.20 - 1.10  x10^3/uL    EOSINOPHIL # <0.10 <=0.50 x10^3/uL    BASOPHIL # <0.10 <=0.20 x10^3/uL    ECHINOCYTE (BURR CELL) 2+/Moderate (A) None       Micro: No results found for any visits on 04/03/21 (from the past 96 hour(s)).      Vitals:   Vitals:    04/03/21 0701 04/03/21 1124 04/03/21 1126 04/03/21 1201   BP:       Pulse:   (!) 112    Resp:   14    Temp:    (!) 34.6 C (94.3 F)   SpO2:  99% 97%    Weight: 70.3 kg (155 lb)      Height: 1.778 m ('5\' 10"'$ )      BMI: 22.29              Vent Settings:  Vent Check  Equipment: Hamilton-G5/S1  Vent BMI #: [id number] Hamilton G5  Mode: 22  Facial Skin Integrity: WNL  Inspired VT (machine): 433 mLs  Expired VT (machine): 436 mLs  Spontaneous Rate: 18 Breaths Per Minute  Set % MinVol: 110 %  Actual Minute Volume: 7.6 Liters  FiO2: 40 %  FIO2 Analyzed: yes  MAP: 10 cmH2O  Set PEEP: 5 cmH2O  PIP: 16 cmH2O  Plateau Pressure: 16 cmH2O  I-Time: 1.46 seconds  I:E Ratio: 1:1.3  Sensitivity: 5    Vital Signs:  Temp (24hrs) Max:34.6 C (26.9 F)      Systolic (48NIO), EVO:350 , Min:67 , KXF:818     Diastolic (29HBZ), JIR:67, Min:53, Max:124    Temp  Avg: 34.6 C (94.3 F)  Min: 34.6 C (94.3 F)  Max: 34.6 C (94.3 F)  MAP (Non-Invasive)  Avg: 107 mmHG  Min: 73 mmHG  Max: 135 mmHG  Pulse  Avg: 117.3  Min: 112  Max: 121  Resp  Avg: 26.5  Min: 14  Max: 39  SpO2  Avg: 96.5 %  Min: 93 %  Max: 99 %          Constitutional  Vitals:   Vitals:    04/03/21 0701 04/03/21 1124 04/03/21 1126 04/03/21 1201   BP:       Pulse:   (!) 112    Resp:   14    Temp:    (!) 34.6 C (94.3 F)   SpO2:  99% 97%    Weight: 70.3 kg (155 lb)      Height: 1.778 m ('5\' 10"'$ )      BMI: 22.29            General appearance of the patient : Intubated  Eyes: PERRLA, Pink Conjunctiva, no Ptosis  Ears, Nose, Mouth, and Throat: nose and ear appear normal,, no pharyngeal edema.  Neck: non tender, no masses, trachea midline, no thyromegaly  Lungs:  Intubated, CTA with normal respiratory effort  CV: RRR, No MRGs, Normal PMI, no peripheral  edema  GI:  Surgical dressing in place, JP drainage in place  Musc:  no digital  cyanosis or clubbing  Skin: Warm and Dry, No rash, lesion or ulcers. Normal turgor and temperature.  Psych:  Sedated  Neurologic : sedated       ASSESSMENT AND PLAN     RAMY GRETH is a 76 y.o., White male  with a Dx of :    Intraperitoneal bleed   Status post exploratory laparotomy today you with ligation of bleeding vessels due to small bleeding vessel at the pancreatic bed with diffuse wheezing of the pancreas   Patient was on Eliquis for recent PE   General surgery following the patient   Plan: Patient will be transferred to [facility] for possible IR intervention    Acute blood loss anemia   Massive blood transfusion protocol started.  Patient received 4 units of PRBC, 3 units of FFP, cryoprecipitate   Platelet ordered   H&H q.6 hours   Patient was on Eliquis as an outpatient and received Kcentra the emergency room    Hypovolemic shock   On Levophed to keep map greater than 65    Recent pulmonary embolism   Patient was on Xarelto as an outpatient restrictive Kcentra at the emergency room    Acute respiratory failure   On a ventilator   Vent adjustment and ABG ordered    Diabetes mellitus  Sliding scale for now    Hypothyroidism   Synthroid    GERD   Protonix      . Sedation:  Fentanyl and propofol    . Analgesia:  Fentanyl drip    . Nutrition/IV fluid:NPO, Ringer lactate 125 cc/hour    . Glucose:  Check glucose every 4 hours    . Lines/Drains/IV Access:  Right femoral Cordis, arterial line    . GI Prophylaxis:  Protonix    . DVT Prophylaxis:  SCD only due to acute bleeding    . Rehabilitation:  PT and OT    . Family:  Discussed with the patient's wife at the bedside    . Code Status:  Full code    Critical care time excluding billable procedure is  36 Mins.    This note may have been partially generated using MModal Fluency Direct system, and there may be some incorrect words, spellings, and punctuation that  were not noted in checking the note before saving.    Lubertha Sayres, MD  Pulmonary &Critical Care

## 2021-04-03 NOTE — H&P (Signed)
[name] [place] [facility]                                 SICU ADMISSION        HISTORY and PHYSICAL    [name]  Date of Admission:  04/03/2021  Date of Birth:  01/07/1946  Date of Service: 04/03/2021    Primary Attending: Dr. Cyndi [name]  Primary Service:  Surg Onc    Information Obtained from: patient and history reviewed via medical record  Chief Complaint: Abdominal pain    PCP: [name]: Surg Onc    HPI:      OR Procedure:  Ex-lap with ligation of bleeding vessel  Intra-Op Complications:  None    Blood Products:  None   EBL: 1500cc    [name] is a 76 y.o. male with a PMH of prostate cancer, HLD, HTN, HIV, hypothyroidism who initially presented to [name] with abdominal pain. He recently had an ex lap, distal pancreatectomy, and splenectomy for IPMN lesion on 03/23/21 at [name]. Postoperatively, he developed a small PE and was started on Eliquis. He began to develop abdominal pain over the past 48 hours and presented to [name] where a CT showed active contrast extravasation at the surgical bed. He was found to be too unstable for transfer to [name] and was taken to the OR for ex lap. Following surgery, he was stable enough for transport to [name] for further management and possible IR intervention.     While at [name], the patient received Kcentra in the ED as well as massive transfusion protocol with 4U PRBCs, 3U of FFP, and cryoprecipitate. He required pressor support and is currently intubated and sedated.       ROS:  MUST comment on all "Abnormal" findings     ROS unable to assess, patient intubated and sedated.     PAST MEDICAL/ FAMILY/ SOCIAL HISTORY:     Past Medical History:   Diagnosis Date    Cancer (CMS [facility])     prostate    Deep vein thrombosis (DVT) (CMS HCC)     right leg    Esophageal reflux     H/O hearing loss     High cholesterol     History of kidney disease     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    HTN  (hypertension)     Human immunodeficiency virus (HIV) disease (CMS HCC)     Hyperlipidemia     "borderline"    Hypothyroidism     MRSA (methicillin resistant staph aureus) culture positive 01/02/2021    MRSA left groin abscess 01/03/21    MRSA (methicillin resistant staph aureus) culture positive 01/02/2021    MRSA blood 01/02/21    Pulmonary embolism (CMS HCC) 03/31/2021    Thyroid disorder     Wears glasses          No Known Allergies  Medications Prior to Admission       Prescriptions    acetaminophen (TYLENOL) 500 mg Oral Tablet    Take 2 Tablets (1,000 mg total) [name] mouth Every 4 hours as needed for Pain    amLODIPine (NORVASC) 5 mg Oral Tablet    Take 1 Tablet (5 mg total) [name] mouth Once a day    apixaban (ELIQUIS) 5 mg Oral Tablet    Take 2 Tablets (10 mg  total) [name] mouth Twice daily for 14 doses    apixaban (ELIQUIS) 5 mg Oral Tablet    Take 1 Tablet (5 mg total) [name] mouth Twice daily for 180 days    atorvastatin (LIPITOR) 80 mg Oral Tablet    Take 0.5 Tablets (40 mg total) [name] mouth Every morning with breakfast    BIOTIN ORAL    Take 1 Tablet [name] mouth Once a day    Blood Sugar Diagnostic (ACCU-CHEK GUIDE TEST STRIPS) Strip    1 Strip Four times a day - before meals and bedtime    cetirizine (ZYRTEC) 10 mg Oral Tablet    Take 1 Tablet (10 mg total) [name] mouth Once a day    cyanocobalamin (VITAMIN B 12) 1,000 mcg Oral Tablet    Take 1 Tablet (1,000 mcg total) [name] mouth Every morning    elviteg-cob-emtri-tenof ALAFEN (GENVOYA) 150-150-200-10 mg Oral Tablet    Take 1 Tablet [name] mouth Once a day    flash glucose scanning reader (FREESTYLE LIBRE 2 READER) Does not apply Misc    Use as directed with FreeStyle Libre 2 sensors    flash glucose sensor (FREESTYLE LIBRE 2 SENSOR) Does not apply Kit    To check blood glucose continuously. Change every 14 days    gabapentin (NEURONTIN) 300 mg Oral Capsule    Take 1 Capsule (300 mg total) [name] mouth Three times a day    insulin aspart U-100 (NOVOLOG FLEXPEN U-100 INSULIN) 100 unit/mL (3  mL) Subcutaneous Insulin Pen    Inject 4 Units under the skin Three times a day With meals    insulin glargine (LANTUS SOLOSTAR U-100 INSULIN) 100 unit/mL Subcutaneous Insulin Pen    Inject 15 units under the skin daily    Patient taking differently:  Inject 15 Units under the skin Every night    insulin lispro (HUMALOG KWIKPEN INSULIN) 100 unit/mL Subcutaneous Insulin Pen    Inject 4 units with each meal. 1 box = 140 day supply    Patient not taking:  Reported on 03/31/2021    insulin syr/ndl U100 half mark 0.3 mL 31 gauge x 5/16" Syringe    Use a new syringe for each injection.    lancets (FREESTYLE LANCETS) 28 gauge Misc    Use to test blood sugar 4 times a day - before meals and bedtime    levothyroxine (SYNTHROID) 88 mcg Oral Tablet    Take 1 Tablet (88 mcg total) [name] mouth Every morning    MELATONIN ORAL    Take 1 Tablet [name] mouth Every night    MULTIVITAMIN ORAL    Take 1 Tablet [name] mouth Once a day    pantoprazole (PROTONIX) 40 mg Oral Tablet, Delayed Release (E.C.)    Take 1 Tablet (40 mg total) [name] mouth Twice daily 30 minutes before a meal    Pen Needle, Disposable, (BD NANO 2ND GEN PEN NEEDLE) 32 gauge x 5/32" Needle    Use a new pen needle for each injection.    POTASSIUM ORAL    Take 1 Tablet [name] mouth Once a day           No current outpatient medications on file.     dexmedeTOMIDine (PRECEDEX) 400 mcg in NS 183m premix infusion, 0.2 mcg/kg/hr (Adjusted), Intravenous, Continuous  docusate sodium (COLACE) 15mper mL oral liquid, 100 mg, Gastric (NG, OG, PEG, GT), 2x/day  electrolyte-A (PLASMALYTE-A) premix infusion, , Intravenous, Continuous  famotidine (PEPCID) 10 mg/mL injection, 20 mg, Intravenous, Daily  fentaNYL (SUBLIMAZE) 50 mcg/mL injection, 50 mcg, Intravenous, Q15 Min PRN  fentaNYL (SUBLIMAZE) 50 mcg/mL IV infusion, 0.2 mcg/kg/hr (Adjusted), Intravenous, Continuous  lidocaine-menthol (LIDOPATCH) 3.6%-1.25% patch, 1 Patch, Transdermal, Daily  senna concentrate (SENNA) 52m per 161moral liquid, 10  mL, Gastric (NG, OG, PEG, GT), 2x/day  SSIP insulin R human (HUMULIN R) 100 units/mL injection, 0-12 Units, Subcutaneous, Q6H PRN      Past Surgical History:   Procedure Laterality Date    COLON SURGERY      per patient had part of colon removed and had a colostomy    COLONOSCOPY      GASTROSCOPY      HIP SURGERY Right 04/22/2020    Right hip arthrotomy irrigation debridement of septic arthritis right hip, [name]  2001    HX COLOSTOMY REVERSAL      HX HERNIA REPAIR      KNEE SURGERY Bilateral     WRIST SURGERY Right          Family Medical History:       Problem Relation (Age of Onset)    Cancer Mother, Father, Other    Heart Attack Mother    High Cholesterol Other    Stroke Other            Social History     Tobacco Use    Smoking status: Never    Smokeless tobacco: Never   Vaping Use    Vaping Use: Never used   Substance Use Topics    Alcohol use: Not Currently    Drug use: Never       Physical Exam   General:  Sedated.  Eyes:  Conjunctiva clear, Pupils reactive  HENT:  NCAT, Mucous membranes moist  Lungs:  Intubated  Cardiovascular:  RR  Abdomen:  S, ND, JP in place in the RUQ.  Extremities:  No cyanosis or edema.  Skin:  Skin warm and dry.  Neurologic:  Sedated.  Psychiatric:  Sedated.     Labs     Lab Results Today:    Results for orders placed or performed during the [name] encounter of 04/03/21 (from the past 24 hour(s))   POC BLOOD GLUCOSE (RESULTS)   Result Value Ref Range    GLUCOSE, POC 239 Fasting: 80-130 mg/dL; 2 HR PC: <180 mg/dL mg/dl   POC BLOOD GLUCOSE (RESULTS)   Result Value Ref Range    GLUCOSE, POC 216 Fasting: 80-130 mg/dL; 2 HR PC: <180 mg/dL mg/dl   TRIGLYCERIDE ROUTINE TODAY - BASELINE   Result Value Ref Range    TRIGLYCERIDES 141 <150 mg/dL   TROPONIN-I   Result Value Ref Range    TROPONIN I 22 <=30 ng/L   LACTIC ACID LEVEL W/ REFLEX FOR LEVEL >2.0   Result Value Ref Range    LACTIC ACID 13.0 (HH) 0.5 - 2.2 mmol/L   PHOSPHORUS   Result Value Ref Range     PHOSPHORUS 8.0 (H) 2.3 - 4.0 mg/dL   COMPREHENSIVE METABOLIC PANEL, NON-FASTING   Result Value Ref Range    SODIUM 143 136 - 145 mmol/L    POTASSIUM 4.1 3.5 - 5.1 mmol/L    CHLORIDE 108 96 - 111 mmol/L    CO2 TOTAL 15 (L) 23 - 31 mmol/L    ANION GAP 20 (H) 4 - 13 mmol/L    BUN 15 8 - 25 mg/dL    CREATININE 1.41 (H) 0.75 - 1.35 mg/dL    BUN/CREA  RATIO 11 6 - 22    ESTIMATED GFR 52 (L) >=60 mL/min/BSA    ALBUMIN 2.5 (L) 3.4 - 4.8 g/dL     CALCIUM 7.6 (L) 8.8 - 10.2 mg/dL    GLUCOSE 208 (H) 65 - 125 mg/dL    ALKALINE PHOSPHATASE 70 45 - 115 U/L    ALT (SGPT) 362 (H) 10 - 55 U/L    AST (SGOT)  388 (H) 8 - 45 U/L    BILIRUBIN TOTAL 0.8 0.3 - 1.3 mg/dL    PROTEIN TOTAL 4.4 (L) 6.0 - 8.0 g/dL   CBC WITH DIFF   Result Value Ref Range    WBC 25.1 (H) 3.7 - 11.0 x10^3/uL    RBC 4.23 (L) 4.50 - 6.10 x10^6/uL    HGB 12.5 (L) 13.4 - 17.5 g/dL    HCT 38.2 (L) 38.9 - 52.0 %    MCV 90.3 78.0 - 100.0 fL    MCH 29.6 26.0 - 32.0 pg    MCHC 32.7 31.0 - 35.5 g/dL    RDW-CV 14.6 11.5 - 15.5 %    PLATELETS 193 150 - 400 x10^3/uL    MPV 10.2 8.7 - 12.5 fL   MANUAL DIFF AND MORPHOLOGY-SYSMEX   Result Value Ref Range    NEUTROPHIL % 77 %    LYMPHOCYTE %  4 %    MONOCYTE % 10 %    EOSINOPHIL % 0 %    BASOPHIL % 0 %    NEUTROPHIL BANDS % 7 %    METAMYELOCYTE %  2 %    NEUTROPHIL # 21.08 (H) 1.50 - 7.70 x10^3/uL    LYMPHOCYTE # 1.00 1.00 - 4.80 x10^3/uL    MONOCYTE # 2.51 (H) 0.20 - 1.10 x10^3/uL    EOSINOPHIL # <0.10 <=0.50 x10^3/uL    BASOPHIL # <0.10 <=0.20 x10^3/uL    ECHINOCYTE (BURR CELL) 2+/Moderate (A) None       Recent Imaging        Assessment/Plan   There are no active [name] problems to display for this patient.      [name] is a 76 y.o. male who is   S/P     NEURO:  GCS: E1=None (Does Not Open Eyes) M1=None (Has No Motor Response to Pinch) V1=None (Makes No Noise or Intubated)T  Imaging: None  Sz prophylaxis: None  Neurochecks q1hr    SBP < 160  Sedation/analgesia: Precedex, fentanyl      PULMONARY:  Airway Ventilator  Settings   EndoTracheal Tube Oral;Cuffed 7.5 Lip (Active)   Airway Secure Device 04/03/21 1200   Position Change No 04/03/21 1200   Retaped N 04/03/21 1200   Physical Assessment of ETT Performed by RN 04/03/21 1200    Conventional settings:  Mode: APV (SIMV)  Set VT: 500 mL  Set Rate: 16 Breaths Per Minute  Set PEEP: 5 cmH2O  Pressure Support: 8 cmH2O  FiO2: 45 %  I:E Ratio: 1:2.8     SpO2  Avg: 95.5 %  Min: 93 %  Max: 99 %  Blood Gas:  No results found for this encounter  Nebs: None  Imaging: CXR is pending    Plan:   - Maintain ventilator settings      CARDIOVASCULAR:  Systolic (82UMP), NTI:144 , Min:67 , RXV:400     Diastolic (86PYP), PJK:93, Min:53, Max:124         Troponins: No results found for: CKMB   Meds: None  Pressors: Levophed  RENAL/GU:  Recent Labs     04/01/21  0234 04/02/21  0355 04/03/21  1151   SODIUM 136 136 143   POTASSIUM 3.9 3.9 4.1   CHLORIDE 102 104 108   BUN '11 13 15   '$ CREATININE 0.89 0.88 1.41*   ANIONGAP 4 8 20*   CALCIUM 8.4* 8.6* 7.6*   MAGNESIUM 2.2 2.2  --    PHOSPHORUS 2.7 2.2* 8.0*       I/O last 24 hours:  No intake or output data in the 24 hours ending 04/03/21 1437  I/O current shift:  No intake/output data recorded.  Foley in critically ill pt for strict I/O's  IV fluids: Plasmalyte @ 100 ml/hr  Diuretics: None      GI:  MNT PROTOCOL FOR DIETITIAN  DIET NPO - NOW STRICT      Recent Labs     04/03/21  1151   ALBUMIN 2.5*     Last BM:  pta  Proph: Colace, Senokot, Pepcid      HEME:  Recent Labs     03/31/21  2042 04/01/21  0234 04/02/21  0355 04/03/21  1151   HGB  --  9.5* 9.3* 12.5*   HCT  --  29.7* 29.6* 38.2*   PLTCNT  --  384 465* 193   APTT 90.3* 69.7* 53.9*  --      Transfusions: 4U PRBCs, 3U of FFP, and cryoprecipitate  Proph: SCD's      ID:  Temp (24hrs) Max:34.7 C (94.5 F)    Recent Labs     04/01/21  0234 04/02/21  0355 04/03/21  1151   WBC 16.1* 12.0* 25.1*   PMNS 85 82 77   BANDS  --   --  7     Blood cultures: N/a  Urine cultures: N/a   BAL: N/a  OR cultures:  N/a  ABX: Restart home HART when able       ENDO:  No results for input(s): GLUCOSEPOC in the last 24 hours.  SSI      MSK:  No skin breakdown  No obvious fractures      OTHER:  Lines: R radial A-line, R femoral cordis  Activity: Bedrest, HOB 30deg  PT/OT:  ordered  MNT:  ordered      PLAN:  - Maintain sedation  - H&H  - CBC  - BMP  - ABG  - EKG  - Trop pending      Modena Nunnery, MD  Orthopaedic PGY-1  [place] [facility]  Pager [phone]  04/03/2021 14:37    SICU Attending Note    I saw and examined the patient.  I reviewed the resident's note and agree with the findings and plan of care as documented in their note.  Any exceptions/additions are edited/noted.    hemorrhagic shock s/p exlap, ligation of bleeding vessel - off pressors, monitor h/h, still with base deficit, hold a/c  acute hypoxic resp failure - wean vent as able  PE - holding a/c for now, monitor    I was present at the bedside of this critically ill patient for 35 minutes exclusive of procedures.  This patient suffers from failure or dysfunction of Neurologic/Sensory, Cardiovascular, Pulmonary, GI/Hepatopancreaticobiliary, Musculoskeletal, and Hematologic system(s).  The care of this patient was in regard to managing (a) conditions(s) that has a high probability of sudden, clinically significant, or life-threatening deterioration and required a high degree of Attending Physician attention and direct involvement to intervene urgently. Data review and  care planning was performed in direct proximity of the patient, examination was obviously performed in direct contact with the patient. All of this time was exclusive of procedure which will be documented elsewhere in the chart.    My critical care time is independent and unique to other providers (no other providers saw patient for purposes of critical care evaluation)  My critical care time involved full attention to the patients' condition and included:    Review of nursing notes and/or old  charts  Review of medications, allergies, and vital signs  Documentation time  Consultant collaboration on findings and treatment options  Care, transfer of care, and discharge plans  Ordering, interpreting, and reviewing diagnostic studies/tab tests  Obtaining necessary history from family, EMS, nursing home staff and/or treating physicians    My critical care time did not include time spent teaching resident physician(s) or other services of resident physicians, or performing other reported procedures.  Total Critical Care Time: 35 minutes    Antoine Primas, MD  Assistant Professor of Surgery  Trauma, Surgical Critical Care, and Pace  04/03/2021 17:47

## 2021-04-03 NOTE — Nurses Notes (Signed)
Pt arrives to unit at 1125. Attached to all monitors and lines. Dr Stephanie Acre and Dr Judith Part at bedside. Pt currently just running KVO fluids. Neo off in OR, VSS.     1130: started prop @ 71mg/kg/min  1152: Started 1 unit cryo. Dr KJudith Partat bedside updated on sudden hypotension. Levo started @ 1 mcg/kg/hr. Orders for 1 unit o PRBC and repeat labs.   1204: levo @ 0.5 mcg/kg/hr  1206: 1 unit cryo complete  1210: Family at bedside and updated by Dr KJudith Part med flight lands on helipad  1215: Transfer consent and paper work completed  1220: 1 unit PRBC's started  1222: levo to 0.4 mcg/kg/hr  1225: Called report to [facility] at bedside, report given, gtts changed to their pumps and appropriate monitors attached. Family leaves to drive to [place]. Pt transferred to their cot, all questions answered.   1300: Pt off unit with flight team at this time. Care relinquished.     TOTAL emptied from JP 2348msince arrival from OR. Only 40 ml urine output.

## 2021-04-03 NOTE — Ancillary Notes (Signed)
[facility]  Spiritual Care Note    Patient Name:  [name]  Date of Encounter:  04/03/2021     04/03/21 1220   Clinical Encounter Type   Reason for Visit Family Members Care  (Patient is post-op from exploratory Lap and now being med-evaced to [facility] and flight crew has arrived.)   Referral From [place] Visit At This Time No   Visited With Patient;Spouse;Daughter  (Wife [name] & daughter [name])   Patient Spiritual Encounters   Spiritual Needs/Issues Unable to assess (comment)  (post--op intubated & sedated)   Ritual Prayer   Family Spiritual Encounters   Spiritual/Coping Resources Beliefs in God/Sacred/Higher Purpose;Sense of purpose/meaning;Supportive family system  (Daughter [name] reports she is an Therapist, sports)   Coping  Offered empathy;Provided supportive presence;Explored emotions  (Family calm, hopeful that bleeding in pt's pancreas can be stopped.)   Ritual  Prayer   Family's Hopes/Goals   Intermediate Goal Wife hopes pt will not loose an more of his pancreas.   Spiritual Care Outcomes with Family   Spiritual/Emotional Processing Spiritual Care relationship established;Family connected to spiritual support   Family Coping More peaceful   Mutuality/Individual Preferences   Anxieties, Fears or Concerns UTA   Time of Encounters   Start Time 1210   Stop Time 1220   Duration (minutes) 10 Minutes     Other Pertinent Information: Patient observed to be intubated & sedated post-surgery with family support at bedside.  Family voiced appreciation for pastoral care - No further needs anticipated as patient being transferred to [facility] SICU at this time...      Wallis Mart, CHAPLAIN  Pager: [facility]

## 2021-04-03 NOTE — Anesthesia Postprocedure Evaluation (Signed)
Anesthesia Post Op Evaluation    Patient: [name]  Procedure(s):  LAPAROTOMY EXPLORATORY    Last Vitals:Temperature: (!) 34.6 C (94.3 F) (04/03/21 1201)  Heart Rate: (!) 112 (04/03/21 1126)  BP (Non-Invasive): 94/61 (04/03/21 0700)  Respiratory Rate: 14 (04/03/21 1126)  SpO2: 97 % (04/03/21 1126)    No notable events documented.      Patient location during evaluation: ICU       Patient participation: complete - patient cannot participate  Level of consciousness: sedated    Airway patency: intubated    Anesthetic complications: no  Cardiovascular status: acceptable and stable  Respiratory status: acceptable and ETT  Hydration status: acceptable  Patient post-procedure temperature: Pt Hypothermia

## 2021-04-03 NOTE — Nurses Notes (Signed)
Patient arrived to SICU bed 7 from [facility] via flight team. SICU team at bedside.

## 2021-04-03 NOTE — Respiratory Therapy (Signed)
PT came to ICU via [place] staff, bagging 100%. Placed on vent with settings of ASV 110%, peep of 5 and 40% fio2. Will continue therapy and monitor till transport.

## 2021-04-03 NOTE — Anesthesia Procedure Notes (Signed)
Arterial Line Procedure    Pt location: In OR  Consent:     Risks discussed:  Bleeding and pain  Universal protocol:     Patient identity confirmed:  Arm band and hospital-assigned identification number  Pre-procedure details:       Skin Prep used: Chlorhexidine gluconate and Isopropyl alcohol  Anesthesia (see MAR for exact dosages):     Anesthesia method:  Under general anesthesia    A 20 G Catheter type: Arrow 1 and 1/2 inch in length,  Placed on the left  radial artery  using anatomical landmarks, palpation and modified Seldinger With  number of attempts:1.Secured with: transparent dressing   MEDICATIONS:     Post-procedure details:    Patient tolerance of procedure:  Tolerated well, no immediate complications blood withdrawn easily, flushes easily and good waveform  Complications:  Performed By:  Performing provider: Shyrl Numbers, CRNA Authorizing provider: Harvest [place], MD

## 2021-04-03 NOTE — Care Plan (Signed)
Problem: Adult Inpatient Plan of Care  Goal: Plan of Care Review  Outcome: Ongoing (see interventions/notes)  Goal: Patient-Specific Goal (Individualized)  Outcome: Ongoing (see interventions/notes)  Goal: Absence of Hospital-Acquired Illness or Injury  Outcome: Ongoing (see interventions/notes)  Intervention: Identify and Manage Fall Risk  Recent Flowsheet Documentation  Taken 04/03/2021 2200 by Luz Lex, RN  Safety Promotion/Fall Prevention: safety round/check completed  Taken 04/03/2021 2100 by Luz Lex, RN  Safety Promotion/Fall Prevention: safety round/check completed  Taken 04/03/2021 1945 by Luz Lex, RN  Safety Promotion/Fall Prevention: safety round/check completed  Intervention: Prevent Skin Injury  Recent Flowsheet Documentation  Taken 04/03/2021 1945 by Luz Lex, RN  Body Position:   supine, head elevated   heels elevated off mattress   lower extremity elevated, left   lower extremity elevated, right   semi-fowlers (30-45 degrees)   upper extremity elevated, left   upper extremity elevated, right   positioned with 2 assist  Skin Protection:   adhesive use limited   tubing/devices free from skin contact  Intervention: Prevent and Manage VTE (Venous Thromboembolism) Risk  Recent Flowsheet Documentation  Taken 04/03/2021 1945 by Luz Lex, RN  VTE Prevention/Management:   dorsiflexion/plantar flexion performed   sequential compression devices on  Intervention: Prevent Infection  Recent Flowsheet Documentation  Taken 04/03/2021 1945 by Luz Lex, RN  Infection Prevention: personal protective equipment utilized  Goal: Optimal Comfort and Wellbeing  Outcome: Ongoing (see interventions/notes)  Intervention: Provide Person-Centered Care  Recent Flowsheet Documentation  Taken 04/03/2021 1945 by Luz Lex, [place] Relationship/Rapport:   care explained   questions answered   reassurance provided   thoughts/feelings acknowledged  Goal: Rounds/Family  Conference  Outcome: Ongoing (see interventions/notes)     Problem: Mechanical Ventilation Invasive  Goal: Effective Communication  Outcome: Ongoing (see interventions/notes)  Intervention: Ensure Effective Communication  Recent Flowsheet Documentation  Taken 04/03/2021 1945 by Luz Lex, [place] Relationship/Rapport:   care explained   questions answered   reassurance provided   thoughts/feelings acknowledged  Diversional Activities: television  [place]:   involvement promoted   presence promoted  Goal: Optimal Device Function  Outcome: Ongoing (see interventions/notes)  Intervention: Optimize Device Care and Function  Recent Flowsheet Documentation  Taken 04/03/2021 1945 by Luz Lex, RN  Airway Safety Measures:   mask valve resuscitator at bedside   oxygen flowmeter at bedside   suction at bedside  Goal: Mechanical Ventilation Liberation  Outcome: Ongoing (see interventions/notes)  Goal: Optimal Nutrition Delivery  Outcome: Ongoing (see interventions/notes)  Goal: Absence of Device-Related Skin and Tissue Injury  Outcome: Ongoing (see interventions/notes)  Intervention: Maintain Skin and Tissue Health  Recent Flowsheet Documentation  Taken 04/03/2021 1945 by Luz Lex, RN  Device Skin Pressure Protection:   absorbent pad utilized/changed   adhesive use limited  Goal: Absence of Ventilator-Induced Lung Injury  Outcome: Ongoing (see interventions/notes)  Intervention: Prevent Ventilator-Associated Pneumonia  Recent Flowsheet Documentation  Taken 04/03/2021 1945 by Luz Lex, RN  Head of Bed [facility]) Positioning: HOB at 30-45 degrees     Problem: Fall Injury Risk  Goal: Absence of Fall and Fall-Related Injury  Outcome: Ongoing (see interventions/notes)  Intervention: Promote Injury-Free Environment  Recent Flowsheet Documentation  Taken 04/03/2021 2200 by Luz Lex, RN  Safety Promotion/Fall Prevention: safety round/check completed  Taken 04/03/2021 2100 by  Luz Lex, RN  Safety Promotion/Fall Prevention: safety round/check completed  Taken 04/03/2021 1945 by Luz Lex, RN  Safety Promotion/Fall Prevention: safety round/check completed

## 2021-04-03 NOTE — Respiratory Therapy (Signed)
Pt extubated to NC per extubation orders. Primary RN at bedside. Charge RT notified.  Placed on 3LNC.  No stridor noted.

## 2021-04-03 NOTE — H&P (Signed)
[facility]  Admission H&P        Date of Service:  04/03/2021  Duard Brady y.o. male  Date of Admission:  04/03/2021  Date of Birth:  [birth year]      Chief Complaint:  Active intraperitoneal bleeding    HPI: ADAIR LAUDERBACK is a 76 y.o., White male who presents with abdominal pain.  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  Postoperatively he developed a small pulmonary embolism own was started on Eliquis.  He started to develop abdominal pain over the last 24-48 hours.  He sought medical attention in the emergency department and a CT scan was obtained that shows active contrast extravasation at the surgical bed.  General surgery was consulted stat for evaluation.  This patient is in obvious distress.  He is unable to answer questions currently.  He does note that he has left upper quadrant abdominal pain.    History:    Past Medical:    Past Medical History:   Diagnosis Date   . Cancer (CMS [facility])     prostate   . Deep vein thrombosis (DVT) (CMS HCC)     right leg   . Esophageal reflux    . H/O hearing loss    . High cholesterol    . 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 HTN (hypertension)    . 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 Hyperlipidemia     "borderline"   . Hypothyroidism    . MRSA (methicillin resistant staph aureus) culture positive 01/02/2021    MRSA left groin abscess 01/03/21   . MRSA (methicillin resistant staph aureus) culture positive 01/02/2021    MRSA blood 01/02/21   . Pulmonary embolism (CMS HCC) 03/31/2021   . Thyroid disorder    . Wears glasses      Past Surgical:    Past Surgical History:   Procedure Laterality Date   . COLON SURGERY      per patient had part of colon removed and had a colostomy   . COLONOSCOPY     . GASTROSCOPY     . HIP SURGERY Right 04/22/2020    Right hip arthrotomy irrigation debridement  of septic arthritis right hip, Dr. Parke Simmers   . [place]  2001   . HX COLOSTOMY REVERSAL     . HX HERNIA REPAIR     . KNEE SURGERY Bilateral    . WRIST SURGERY Right      Family:    Family Medical History:     Problem Relation (Age of Onset)    Cancer Mother, Father, Other    Heart Attack Mother    High Cholesterol Other    Stroke Other        Social:   reports that he has never smoked. He has never used smokeless tobacco. He reports that he does not currently use alcohol. He reports that he does not use drugs.    No Known Allergies  Medications Prior to Admission     Prescriptions    acetaminophen (TYLENOL) 500 mg Oral Tablet    Take 2 Tablets (1,000 mg total) by mouth Every 4 hours as needed for Pain    amLODIPine (NORVASC) 5 mg Oral Tablet    Take 1 Tablet (5 mg total) by mouth  Once a day    apixaban (ELIQUIS) 5 mg Oral Tablet    Take 2 Tablets (10 mg total) by mouth Twice daily for 14 doses    apixaban (ELIQUIS) 5 mg Oral Tablet    Take 1 Tablet (5 mg total) by mouth Twice daily for 180 days    atorvastatin (LIPITOR) 80 mg Oral Tablet    Take 0.5 Tablets (40 mg total) by mouth Every morning with breakfast    BIOTIN ORAL    Take 1 Tablet by mouth Once a day    Blood Sugar Diagnostic (ACCU-CHEK GUIDE TEST STRIPS) Strip    1 Strip Four times a day - before meals and bedtime    cetirizine (ZYRTEC) 10 mg Oral Tablet    Take 1 Tablet (10 mg total) by mouth Once a day    cyanocobalamin (VITAMIN B 12) 1,000 mcg Oral Tablet    Take 1 Tablet (1,000 mcg total) by mouth Every morning    elviteg-cob-emtri-tenof ALAFEN (GENVOYA) 150-150-200-10 mg Oral Tablet    Take 1 Tablet by mouth Once a day    flash glucose scanning reader (FREESTYLE LIBRE 2 READER) Does not apply Misc    Use as directed with FreeStyle Libre 2 sensors    flash glucose sensor (FREESTYLE LIBRE 2 SENSOR) Does not apply Kit    To check blood glucose continuously. Change every 14 days    gabapentin (NEURONTIN) 300 mg Oral Capsule    Take 1  Capsule (300 mg total) by mouth Three times a day    insulin aspart U-100 (NOVOLOG FLEXPEN U-100 INSULIN) 100 unit/mL (3 mL) Subcutaneous Insulin Pen    Inject 4 Units under the skin Three times a day With meals    insulin glargine (LANTUS SOLOSTAR U-100 INSULIN) 100 unit/mL Subcutaneous Insulin Pen    Inject 15 units under the skin daily    Patient taking differently:  Inject 15 Units under the skin Every night    insulin lispro (HUMALOG KWIKPEN INSULIN) 100 unit/mL Subcutaneous Insulin Pen    Inject 4 units with each meal. 1 box = 140 day supply    Patient not taking:  Reported on 03/31/2021    insulin syr/ndl U100 half mark 0.3 mL 31 gauge x 5/16" Syringe    Use a new syringe for each injection.    lancets (FREESTYLE LANCETS) 28 gauge Misc    Use to test blood sugar 4 times a day - before meals and bedtime    levothyroxine (SYNTHROID) 88 mcg Oral Tablet    Take 1 Tablet (88 mcg total) by mouth Every morning    MELATONIN ORAL    Take 1 Tablet by mouth Every night    MULTIVITAMIN ORAL    Take 1 Tablet by mouth Once a day    pantoprazole (PROTONIX) 40 mg Oral Tablet, Delayed Release (E.C.)    Take 1 Tablet (40 mg total) by mouth Twice daily 30 minutes before a meal    Pen Needle, Disposable, (BD NANO 2ND GEN PEN NEEDLE) 32 gauge x 5/32" Needle    Use a new pen needle for each injection.    POTASSIUM ORAL    Take 1 Tablet by mouth Once a day        fentaNYL (PF) (SUBLIMAZE) 2,500 mcg in NS 250 mL (tot vol) infusion, 0.5 mcg/kg/hr (Adjusted), Intravenous, Continuous  fentaNYL (SUBLIMAZE) 50 mcg/mL injection, 100 mcg, Intravenous, Once  fentaNYL (SUBLIMAZE) 50 mcg/mL injection, 50 mcg, Intravenous, Q15 Min PRN  fibrin sealant component (  TISSEEL VHSD) 4 mL topical syringe - FROZEN, , , One-Step Med: Once PRN  fibrin sealant component (TISSEEL) 10 mL topical syringe - FROZEN, , , One-Step Med: Once PRN  nalOXone (NARCAN) 1 mg/mL injection, 1 mg, Intravenous, Q2 MIN PRN  NS flush syringe, 3 mL, Intracatheter, Q8HRS  NS  flush syringe, 3 mL, Intracatheter, Q1H PRN  NS flush syringe, 3 mL, Intracatheter, Q8HRS  NS flush syringe, 3 mL, Intracatheter, Q1H PRN  propofoL (DIPRIVAN) 10 mg/mL premix infusion ---Cabinet Override, , ,   propofol (DIPRIVAN) 10 mg/mL premix infusion, 5 mcg/kg/min (Adjusted), Intravenous, Continuous        ROS:   Review of Systems   Unable to perform ROS: Acuity of condition       All other systems negative unless marked.       Exam:  Vitals:    04/03/21 0651 04/03/21 0700 04/03/21 0701 04/03/21 1126   BP: (!) 67/53 94/61     Pulse: (!) 119   (!) 112   Resp:    14   SpO2:  93%  97%   Weight:   70.3 kg (155 lb)    Height:   1.778 m ('5\' 10"'$ )    BMI:   22.29              Intake/Output Summary (Last 24 hours) at 04/03/2021 1147  Last data filed at 04/03/2021 1046  Gross per 24 hour   Intake 2300 ml   Output 1550 ml   Net 750 ml        Physical Exam  HENT:      Head: Normocephalic and atraumatic.      Nose: Nose normal.   Eyes:      Conjunctiva/sclera: Conjunctivae normal.      Pupils: Pupils are equal, round, and reactive to light.   Neck:      Thyroid: No thyromegaly.      Vascular: No JVD.      Trachea: No tracheal deviation.   Cardiovascular:      Rate and Rhythm: Normal rate and regular rhythm.      Heart sounds: Normal heart sounds. No murmur heard.    No friction rub. No gallop.   Pulmonary:      Effort: Pulmonary effort is normal. No respiratory distress.      Breath sounds: Normal breath sounds. No wheezing or rales.   Abdominal:      General: Bowel sounds are normal. There is distension.      Palpations: Abdomen is soft. There is no mass.      Tenderness: There is abdominal tenderness. There is guarding and rebound.      Comments: Patient's abdomen is markedly distended and rigid to palpation.  He is exquisitely tender in the left upper quadrant.  However the tenderness is diffuse.  He has ecchymosis over the left flank area.   Musculoskeletal:         General: Normal range of motion.      Cervical back:  Normal range of motion and neck supple.   Lymphadenopathy:      Cervical: No cervical adenopathy.   Skin:     General: Skin is warm and dry.      Findings: No erythema or rash.   Neurological:      Mental Status: He is alert and oriented to person, place, and time.   Psychiatric:         Mood and Affect: Mood and affect normal.  Cognition and Memory: Memory normal.         Judgment: Judgment normal.          Labs:     Results for orders placed or performed during the hospital encounter of 04/03/21 (from the past 24 hour(s))   CBC/DIFF    Narrative    The following orders were created for panel order CBC/DIFF.  Procedure                               Abnormality         Status                     ---------                               -----------         ------                     CBC WITH VELF[[id number]]                                                                 Please view results for these tests on the individual orders.   POC BLOOD GLUCOSE (RESULTS)   Result Value Ref Range    GLUCOSE, POC 239 Fasting: 80-130 mg/dL; 2 HR PC: <180 mg/dL mg/dl   POC BLOOD GLUCOSE (RESULTS)   Result Value Ref Range    GLUCOSE, POC 216 Fasting: 80-130 mg/dL; 2 HR PC: <180 mg/dL mg/dl   Results for orders placed or performed during the hospital encounter of 03/30/21 (from the past 24 hour(s))   POC BLOOD GLUCOSE (RESULTS)   Result Value Ref Range    GLUCOSE, POC 157 (H) 70 - 105 mg/dl        Imaging Studies:    No orders to display       DNR Status:  Full Code    Assessment/Plan:   Active Hospital Problems    Diagnosis   . Primary Problem: S/P exploratory laparotomy     This is a 76 year old male patient who is status post distal pancreatectomy splenectomy with postoperative anticoagulation for pulmonary embolism.  It appears that he has developed an active bleed at the surgical site.  Patient is too unstable for transfer at this time.  I have spoken with his primary surgeon Dr. Cyndi Bender and I am planning to proceed to the  operating room for exploratory laparotomy.  Once he is stabilized we will plan for transfer to [facility] for further intervention including Interventional Radiology.    Armando Gang, DO    This note was partially generated using MModal Fluency Direct system, and there may be some incorrect words, spellings, and punctuation that were not noted in checking the note before saving

## 2021-04-03 NOTE — OR Surgeon (Signed)
[facility]                                              OPERATIVE NOTE    Patient Name: [name], [name] Number: [id number]  Date of Service: 04/03/2021   Date of Birth: [birth year]    All elements must be documented.    Pre-Operative Diagnosis:  Intraperitoneal bleeding  Post-Operative Diagnosis:  Small bleeding vessel at the pancreatic bed with diffuse oozing of the pancreas.  Procedure(s)/Description:  Exploratory laparotomy with ligation of bleeding vessel.    Attending Surgeon:  Armando Gang, DO  First assistant: Alinda Deem ARNP RNFA    Anesthesia Type: General  Estimated Blood Loss:  1500 cc  Blood Given:  None  Fluids Given:  Crystalloid  Complications (not routinely expected or not inherent to difficulty/nature of procedure):  None    Wound Class: Clean Wound    Tubes:  None  Drains:  19 French round JP drain.  Specimens/ Cultures:  None  Implants:  None           Disposition:  PACU  Condition:  Stable      Indication:  76 year old male patient presenting to the emergency department with severe left upper quadrant abdominal pain.  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  Postoperatively he developed a pulmonary embolism and was started on Eliquis.  He presents to the emergency department and a workup was completed that shows active contrast extravasation from the surgical bed.  He was given Kcentra as well as TXA and resuscitated in the emergency department.  His wife was counseled for a exploratory laparotomy. I discussed the procedure at length and answered all questions.  All risks including bleeding, infection, injury to structures, need for additional procedures was discussed.  Benefits and alternatives were also addressed.  Informed consent was obtained.      Procedure:      Informed verbal and written consent was obtained.The patient was transferred to the operating room and placed in supine position on the  operating table. Time-out was performed identifying patient, procedure, position, site, surgeon, and equipment. Following induction of general anesthesia, the abdomen was prepped with Betadine and allowed to dry, and draped in the usual sterile fashion.  A laparotomy incision was opened using a 10 blade.  Dissection was carried in the subcutaneous tissues using electrocautery.  The fascia was then opened using electrocautery.  There is substantial adhesion of the omentum to the posterior fascia.  These adhesions were taken down bluntly using finger dissection.  There is a large amount of blood in the left upper quadrant.  The upper quadrants were then packed with extra-large laps.  The abdomen was then inspected 1 area at a time.  First attention was given to the left upper quadrant.  The packs were removed however no active bleeding is present in the area of the splenectomy.  The drain was identified and removed.  Next the pancreas was inspected.  We were able to identify a small actively bleeding vessel in the liver bed.  Hemostasis was then achieved using clips.  The pancreas overall is extremely oozy.  Hemostasis was achieved using direct pressure as well as Tisseel fibrin glue, and Arista hemostatic agent.  Once adequate hemostasis was achieved and 19 [place] round drain was placed near this area  and into the left upper quadrant.  It was secured to the skin using an 0 silk suture.  The abdomen was then copiously irrigated and evacuated of all free fluid.  No additional sites of active bleeding were identified.  However the patient remains overall oozy from all raw surfaces.  The fascia was then reapproximated using 0 looped PDS suture in a running fashion.  Skin was reapproximated using skin staples.  The incision was dressed with sponges and tape.  The patient remains in critical condition.  He was transported intubated to the ICU for further resuscitation.  All needle sponge and instrument counts are correct  at the end of the case.    Alinda Deem acted as first Environmental consultant throughout the case, no residents were available.  Her duties consisted of assisting with abdominal entry, retraction, camera use, instrument exchanges, suctioning, cauterizing, closing of the fascia, and suturing of the skin.            Armando Gang, DO    This note was partially generated using MModal Fluency Direct system, and there may be some incorrect words, spellings, and punctuation that were not noted in checking the note before saving, though effort was made to avoid such errors.

## 2021-04-03 NOTE — Anesthesia Transfer of Care (Signed)
ANESTHESIA TRANSFER OF CARE   [name] is a 76 y.o. ,male, Weight: 70.3 kg (155 lb)   had Procedure(s):  LAPAROTOMY EXPLORATORY  performed  04/03/21   Primary Service: Armando Gang, DO    Past Medical History:   Diagnosis Date   . Cancer (CMS [facility])     prostate   . Deep vein thrombosis (DVT) (CMS HCC)     right leg   . Esophageal reflux    . H/O hearing loss    . High cholesterol    . 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 HTN (hypertension)    . 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 Hyperlipidemia     "borderline"   . Hypothyroidism    . MRSA (methicillin resistant staph aureus) culture positive 01/02/2021    MRSA left groin abscess 01/03/21   . MRSA (methicillin resistant staph aureus) culture positive 01/02/2021    MRSA blood 01/02/21   . Pulmonary embolism (CMS HCC) 03/31/2021   . Thyroid disorder    . Wears glasses       Allergy History as of 04/03/21      No Known Allergies              I completed my transfer of care / handoff to the receiving personnel during which we discussed:  Analgesia and All key/critical aspects of case discussed                                                                      Last OR Temp:    ABG:  PH (ARTERIAL)   Date Value Ref Range Status   03/22/2021 7.36 7.35 - 7.45 Final     PH (T)   Date Value Ref Range Status   03/22/2021 7.36 7.35 - 7.45 Final     PCO2 (ARTERIAL)   Date Value Ref Range Status   03/22/2021 38 35 - 45 mm/Hg Final     PCO2 (VENOUS)   Date Value Ref Range Status   03/31/2021 47 41 - 51 mm/Hg Final     PO2 (ARTERIAL)   Date Value Ref Range Status   03/22/2021 117 (H) 72 - 100 mm/Hg Final     PO2 (VENOUS)   Date Value Ref Range Status   03/31/2021 49 35 - 50 mm/Hg Final     SODIUM   Date Value Ref Range Status   03/22/2021 135 (L) 137 - 145 mmol/L Final     POTASSIUM   Date Value Ref Range Status   04/02/2021 3.9 3.5 - 5.1 mmol/L Final     KETONES   Date Value Ref Range Status   01/02/2021 Not  Detected Not Detected mg/dL Final     WHOLE BLOOD POTASSIUM   Date Value Ref Range Status   03/22/2021 3.6 3.5 - 4.6 mmol/L Final     CHLORIDE   Date Value Ref Range Status   03/22/2021 106 101 - 111 mmol/L Final     CALCIUM   Date Value Ref Range Status   04/02/2021 8.6 (L) 8.8 - 10.2 mg/dL Final     Calculated P Axis   Date Value  Ref Range Status   03/31/2021 48 degrees Final     Calculated R Axis   Date Value Ref Range Status   03/31/2021 2 degrees Final     Calculated T Axis   Date Value Ref Range Status   03/31/2021 58 degrees Final     IONIZED CALCIUM   Date Value Ref Range Status   03/22/2021 1.15 1.10 - 1.35 mmol/L Final     LACTATE   Date Value Ref Range Status   03/31/2021 0.6 0.0 - 1.3 mmol/L Final     HEMOGLOBIN   Date Value Ref Range Status   03/22/2021 11.1 (L) 12.0 - 18.0 g/dL Final     OXYHEMOGLOBIN   Date Value Ref Range Status   03/22/2021 97.8 85.0 - 98.0 % Final     CARBOXYHEMOGLOBIN   Date Value Ref Range Status   03/22/2021 1.8 0.0 - 2.5 % Final     MET-HEMOGLOBIN   Date Value Ref Range Status   03/22/2021 0.4 0.0 - 2.0 % Final     BASE EXCESS   Date Value Ref Range Status   03/31/2021 3.5 (H) -3.0 - 3.0 mmol/L Final     BASE DEFICIT   Date Value Ref Range Status   03/22/2021 3.6 (H) 0.0 - 3.0 mmol/L Final     BICARBONATE (ARTERIAL)   Date Value Ref Range Status   03/22/2021 22.1 18.0 - 26.0 mmol/L Final     BICARBONATE (VENOUS)   Date Value Ref Range Status   03/31/2021 27.3 (H) 22.0 - 26.0 mmol/L Final     TEMPERATURE, COMP   Date Value Ref Range Status   03/22/2021 36.9 15.0 - 40.0 C Final     %FIO2 (VENOUS)   Date Value Ref Range Status   03/31/2021 32.0 % Final     Airway:  EndoTracheal Tube Oral;Cuffed 7.5 Lip (Active)   Airway Secure Tape 04/03/21 0007     Blood pressure 94/61, pulse (!) 119, resp. rate (!) 39, height 1.778 m ('5\' 10"'$ ), weight 70.3 kg (155 lb), SpO2 93 %.

## 2021-04-03 NOTE — OR Nursing (Signed)
Gave the details of bed number and contact number available for [facility] with phone number that is needed for the helicopter transportation to the ICU nurse in charged.

## 2021-04-03 NOTE — OR Nursing (Signed)
Called ICU to give 37mn warning at 1043, spoke with ICU nurse taking Patient

## 2021-04-03 NOTE — H&P (Signed)
[name]  Surgical Oncology H&P        [name], 76 y.o. male  Date of Birth:  Nov 19, 1945  Date of service: 04/03/2021  Encounter Start Date: 04/03/2021  Inpatient Admission Date:  04/03/2021    Information Obtained from: history reviewed via medical record  Chief Complaint: Intraperitoneal bleeding     PCP: [name] / PLAN:   76 y.o. 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 He underwent massive transfusion protocol and ex lap on 04/03/21 with ligation of a bleeding vessel. Currently off pressors.    - q4h H/H  - maintain JP drain and NG tube   - TEG based resuscitation   - Hold anticoagulation  - Wean vent as able   - Appreciate SICU care     HPI:   [name] is a 76 y.o. 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 He was started on Eliquis post-operatively for a small pulmonary embolism. He presented to [name] with abdominal pain where CT imaging showed active extravasation at his surgical site. He was given Kcentra and TXA and was resuscitated in the emergency department with 4u PRBC's, 3u FFP, and cryoprecipitate. He was too unstable to transfer to [name] at that time and went to the operating room for exploratory laparotomy where a bleeding vessel was found in the liver bed and was clipped. He was transferred to [name] post-operatively for further evaluation and management. Patient is currently intubated, sedated, and critically ill and unable to participate in history.     ROS Review of systems was not obtained due to patient intubated/sedated .    PAST MEDICAL/ FAMILY/ SOCIAL HISTORY:       Past Medical History:   Diagnosis  Date    Cancer (CMS [name])     prostate    Deep vein thrombosis (DVT) (CMS [facility])     right leg    Esophageal reflux     H/O hearing loss     High cholesterol     History of kidney disease     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    HTN (hypertension)     Human immunodeficiency virus (HIV) disease (CMS [facility])     Hyperlipidemia     "borderline"    Hypothyroidism     MRSA (methicillin resistant staph aureus) culture positive 01/02/2021    MRSA left groin abscess 01/03/21    MRSA (methicillin resistant staph aureus) culture positive 01/02/2021    MRSA blood 01/02/21    Pulmonary embolism (CMS [place]) 03/31/2021    Thyroid disorder     Wears glasses          No Known Allergies    Medications Prior to Admission       Prescriptions    acetaminophen (TYLENOL) 500 mg Oral Tablet    Take 2 Tablets (1,000 mg total) by mouth Every 4 hours as needed for Pain    amLODIPine (NORVASC) 5 mg Oral Tablet    Take 1 Tablet (5 mg total) by mouth Once a day    apixaban (ELIQUIS) 5 mg Oral Tablet  Take 2 Tablets (10 mg total) by mouth Twice daily for 14 doses    apixaban (ELIQUIS) 5 mg Oral Tablet    Take 1 Tablet (5 mg total) by mouth Twice daily for 180 days    atorvastatin (LIPITOR) 80 mg Oral Tablet    Take 0.5 Tablets (40 mg total) by mouth Every morning with breakfast    BIOTIN ORAL    Take 1 Tablet by mouth Once a day    Blood Sugar Diagnostic (ACCU-CHEK GUIDE TEST STRIPS) Strip    1 Strip Four times a day - before meals and bedtime    cetirizine (ZYRTEC) 10 mg Oral Tablet    Take 1 Tablet (10 mg total) by mouth Once a day    cyanocobalamin (VITAMIN B 12) 1,000 mcg Oral Tablet    Take 1 Tablet (1,000 mcg total) by mouth Every morning    elviteg-cob-emtri-tenof ALAFEN (GENVOYA) 150-150-200-10 mg Oral Tablet    Take 1 Tablet by mouth Once a day    flash glucose scanning reader (FREESTYLE LIBRE 2 READER) Does not apply Misc    Use as directed with FreeStyle Libre 2 sensors    flash glucose sensor (FREESTYLE LIBRE 2 SENSOR) Does not apply  Kit    To check blood glucose continuously. Change every 14 days    gabapentin (NEURONTIN) 300 mg Oral Capsule    Take 1 Capsule (300 mg total) by mouth Three times a day    insulin aspart U-100 (NOVOLOG FLEXPEN U-100 INSULIN) 100 unit/mL (3 mL) Subcutaneous Insulin Pen    Inject 4 Units under the skin Three times a day With meals    insulin glargine (LANTUS SOLOSTAR U-100 INSULIN) 100 unit/mL Subcutaneous Insulin Pen    Inject 15 units under the skin daily    Patient taking differently:  Inject 15 Units under the skin Every night    insulin lispro (HUMALOG KWIKPEN INSULIN) 100 unit/mL Subcutaneous Insulin Pen    Inject 4 units with each meal. 1 box = 140 day supply    Patient not taking:  Reported on 03/31/2021    insulin syr/ndl U100 half mark 0.3 mL 31 gauge x 5/16" Syringe    Use a new syringe for each injection.    lancets (FREESTYLE LANCETS) 28 gauge Misc    Use to test blood sugar 4 times a day - before meals and bedtime    levothyroxine (SYNTHROID) 88 mcg Oral Tablet    Take 1 Tablet (88 mcg total) by mouth Every morning    MELATONIN ORAL    Take 1 Tablet by mouth Every night    MULTIVITAMIN ORAL    Take 1 Tablet by mouth Once a day    pantoprazole (PROTONIX) 40 mg Oral Tablet, Delayed Release (E.C.)    Take 1 Tablet (40 mg total) by mouth Twice daily 30 minutes before a meal    Pen Needle, Disposable, (BD NANO 2ND GEN PEN NEEDLE) 32 gauge x 5/32" Needle    Use a new pen needle for each injection.    POTASSIUM ORAL    Take 1 Tablet by mouth Once a day             Current Inpatient Medications:  dexmedeTOMIDine (PRECEDEX) 400 mcg in NS 166m premix infusion, 0.2 mcg/kg/hr (Adjusted), Intravenous, Continuous  electrolyte-A (PLASMALYTE-A) premix infusion, , Intravenous, Continuous  famotidine (PEPCID) 10 mg/mL injection, 20 mg, Intravenous, Daily  fentaNYL (SUBLIMAZE) 50 mcg/mL injection, 50 mcg, Intravenous, Q15 Min PRN  fentaNYL (SUBLIMAZE) 50 mcg/mL  IV infusion, 0.2 mcg/kg/hr (Adjusted), Intravenous,  Continuous  lidocaine-menthol (LIDOPATCH) 3.6%-1.25% patch, 1 Patch, Transdermal, Daily  norepinephrine (LEVOPHED) 1 mg/mL injection ---Cabinet Override, , ,   SSIP insulin R human (HUMULIN R) 100 units/mL injection, 0-12 Units, Subcutaneous, Q6H PRN        Past Surgical History:   Procedure Laterality Date    COLON SURGERY      per patient had 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 Darryl Nestle CERVICAL SPINE SURGERY  2001    HX COLOSTOMY REVERSAL      HX HERNIA REPAIR      KNEE SURGERY Bilateral     WRIST SURGERY Right            Family History:    Family Medical History:       Problem Relation (Age of Onset)    Cancer Mother, Father, Other    Heart Attack Mother    High Cholesterol Other    Stroke Other              Social History     Tobacco Use    Smoking status: Never    Smokeless tobacco: Never   Vaping Use    Vaping Use: Never used   Substance Use Topics    Alcohol use: Not Currently    Drug use: Never         PHYSICAL EXAMINATION:   Exam SpO2: 95 %  Gen: Lying in bed, acutely ill appearing  Neuro: Intubated and sedated  HEENT: Anicteric  Neck: Trachea midline, no JVD, ETT in place  CV: Sinus tachycardia on monitor   Pulm: Equal chest rise, mechanical ventilation   Abd: Soft, nondistended, midline dressing in place with strike through, JP in RUQ with sanguinous output  Ext: No peripheral edema   Derm: Warm and dry, no jaundice   Psych: Sedated     LABS REVIEWED:     Lab Results Today:    Results for orders placed or performed during the [name] encounter of 04/03/21 (from the past 24 hour(s))   BASIC METABOLIC PANEL   Result Value Ref Range    SODIUM 143 136 - 145 mmol/L    POTASSIUM 4.1 3.5 - 5.1 mmol/L    CHLORIDE 110 96 - 111 mmol/L    CO2 TOTAL 14 (L) 23 - 31 mmol/L    ANION GAP 19 (H) 4 - 13 mmol/L    CALCIUM 8.0 (L) 8.8 - 10.2 mg/dL    GLUCOSE 281 (H)  65 - 125 mg/dL    BUN 17 8 - 25 mg/dL    CREATININE 1.39 (H) 0.75 - 1.35 mg/dL    BUN/CREA RATIO 12 6 - 22    ESTIMATED GFR 53 (L) >=60 mL/min/BSA   TROPONIN-I   Result Value Ref Range    TROPONIN I 21 0 - 30 ng/L   TEG, RAPID GLOBAL WITH LYSIS (TRAUMA)   Result Value Ref Range    R (CK) 7.7 4.6 - 9.1 min    LYS30 (%) 0.0 0.0 - 2.6 %    MA (CRT RAPID) 65.4 52.0 - 70.0 mm    MA FIBRINOGEN (CFF) 31.9 15.0 - 32.0 mm   PT/INR   Result Value Ref Range    PROTHROMBIN TIME 15.2 (H) 9.1 -  13.9 seconds    INR 1.31 (H) 0.80 - 1.20   PTT (PARTIAL THROMBOPLASTIN TIME)   Result Value Ref Range    APTT 32.3 24.2 - 37.5 seconds   ALT (SGPT)   Result Value Ref Range    ALT (SGPT) 335 (H) 10 - 55 U/L   AST (SGOT)   Result Value Ref Range    AST (SGOT)  382 (H) 8 - 45 U/L   BILIRUBIN TOTAL   Result Value Ref Range    BILIRUBIN TOTAL 0.9 0.3 - 1.3 mg/dL   ALK PHOS (ALKALINE PHOSPHATASE)   Result Value Ref Range    ALKALINE PHOSPHATASE 70 45 - 115 U/L   GAMMA GT   Result Value Ref Range    GGT 57 (H) 7 - 50 U/L   PROTEIN TOTAL   Result Value Ref Range    PROTEIN TOTAL 4.8 (L) 6.0 - 8.0 g/dL   CBC WITH DIFF   Result Value Ref Range    WBC 21.9 (H) 3.7 - 11.0 x103/uL    RBC 4.28 (L) 4.50 - 6.10 x106/uL    HGB 12.9 (L) 13.4 - 17.5 g/dL    HCT 36.6 (L) 38.9 - 52.0 %    MCV 85.5 78.0 - 100.0 fL    MCH 30.1 26.0 - 32.0 pg    MCHC 35.2 31.0 - 35.5 g/dL    RDW-CV 14.6 11.5 - 15.5 %    PLATELETS 155 150 - 400 x103/uL    MPV 11.3 8.7 - 12.5 fL   MANUAL DIFF AND MORPHOLOGY-SYSMEX   Result Value Ref Range    NEUTROPHIL % 96 %    LYMPHOCYTE %  2 %    MONOCYTE % 2 %    EOSINOPHIL % 0 %    BASOPHIL % 0 %    METAMYELOCYTE %  1 %    NEUTROPHIL # 21.02 (H) 1.50 - 7.70 x103/uL    LYMPHOCYTE # 0.44 (L) 1.00 - 4.80 x103/uL    MONOCYTE # 0.44 0.20 - 1.10 x103/uL    EOSINOPHIL # <0.10 <=0.50 x103/uL    BASOPHIL # <0.10 <=0.20 x103/uL    ECHINOCYTE (BURR CELL) 2+/Moderate (A) None    TOXIC GRANULATION Present (A) None   TYPE AND CROSS RED CELLS -  UNITS , 3 Units   Result Value Ref Range    UNITS ORDERED 3     SPECIMEN EXPIRATION DATE 04/06/2021,2359     ABO/RH(D) A NEGATIVE     ANTIBODY SCREEN NEGATIVE    BPAM PACKED CELL ORDER   Result Value Ref Range    Coding System [id number]     UNIT NUMBER [id number]     BLOOD COMPONENT TYPE LR RBC, Adsol3, 04761     UNIT DIVISION 00     UNIT DISPENSE STATUS ALLOCATED     TRANSFUSION STATUS OK TO TRANSFUSE     IS CROSSMATCH Electronically Compatible     Product Code E8315V76     Coding System ISBT128     UNIT NUMBER [id number]     BLOOD COMPONENT TYPE LR RBC, Adsol1, 04710     UNIT DIVISION 00     UNIT DISPENSE STATUS ALLOCATED     TRANSFUSION STATUS OK TO TRANSFUSE     IS CROSSMATCH Electronically Compatible     Product Code W5462V03     Coding System ISBT128     UNIT NUMBER [id number]     BLOOD COMPONENT TYPE LR RBC, [place], [place]  UNIT DIVISION 00     UNIT DISPENSE STATUS ALLOCATED     TRANSFUSION STATUS OK TO TRANSFUSE     IS CROSSMATCH Electronically Compatible     Product Code [phone]    ARTERIAL BLOOD GAS WITH LACTATE REFLEX - PLEASE OBTAIN 30 MINUTES AFTER ARRIVAL TO UNIT   Result Value Ref Range    %FIO2 (ARTERIAL) 45 %    PH (ARTERIAL) 7.33 (L) 7.35 - 7.45    PCO2 (ARTERIAL) 33 (L) 35 - 45 mm/Hg    PO2 (ARTERIAL) 73 72 - 100 mm/Hg    BASE DEFICIT 7.5 (H) 0.0 - 3.0 mmol/L    BICARBONATE (ARTERIAL) 19.0 18.0 - 26.0 mmol/L    LACTATE 7.1 (H) 0.0 - 1.3 mmol/L    PAO2/FIO2 RATIO 162 <=200   LACTIC ACID TIMED   Result Value Ref Range    LACTIC ACID 4.0 (HH) 0.5 - 2.2 mmol/L   ARTERIAL BLOOD GAS WITH LACTATE REFLEX   Result Value Ref Range    %FIO2 (ARTERIAL) 36 %    PH (ARTERIAL) 7.42 7.35 - 7.45    PCO2 (ARTERIAL) 36 35 - 45 mm/Hg    PO2 (ARTERIAL) 66 (L) 72 - 100 mm/Hg    BASE DEFICIT 0.7 0.0 - 3.0 mmol/L    BICARBONATE (ARTERIAL) 24.3 18.0 - 26.0 mmol/L    LACTATE 3.1 (H) 0.0 - 1.3 mmol/L    PAO2/FIO2 RATIO 183 <=200   H & H   Result Value Ref Range    HGB 11.0 (L) 13.4 - 17.5 g/dL    HCT 30.4 (L) 38.9  - 52.0 %   POC BLOOD GLUCOSE (RESULTS)   Result Value Ref Range    GLUCOSE, POC 166 (H) 70 - 105 mg/dl   Results for orders placed or performed during the [name] encounter of 04/03/21 (from the past 24 hour(s))   POC BLOOD GLUCOSE (RESULTS)   Result Value Ref Range    GLUCOSE, POC 239 Fasting: 80-130 mg/dL; 2 HR PC: <180 mg/dL mg/dl   POC BLOOD GLUCOSE (RESULTS)   Result Value Ref Range    GLUCOSE, POC 216 Fasting: 80-130 mg/dL; 2 HR PC: <180 mg/dL mg/dl   TRIGLYCERIDE ROUTINE TODAY - BASELINE   Result Value Ref Range    TRIGLYCERIDES 141 <150 mg/dL   TROPONIN-I   Result Value Ref Range    TROPONIN I 22 <=30 ng/L   LACTIC ACID LEVEL W/ REFLEX FOR LEVEL >2.0   Result Value Ref Range    LACTIC ACID 13.0 (HH) 0.5 - 2.2 mmol/L   PHOSPHORUS   Result Value Ref Range    PHOSPHORUS 8.0 (H) 2.3 - 4.0 mg/dL   COMPREHENSIVE METABOLIC PANEL, NON-FASTING   Result Value Ref Range    SODIUM 143 136 - 145 mmol/L    POTASSIUM 4.1 3.5 - 5.1 mmol/L    CHLORIDE 108 96 - 111 mmol/L    CO2 TOTAL 15 (L) 23 - 31 mmol/L    ANION GAP 20 (H) 4 - 13 mmol/L    BUN 15 8 - 25 mg/dL    CREATININE 1.41 (H) 0.75 - 1.35 mg/dL    BUN/CREA RATIO 11 6 - 22    ESTIMATED GFR 52 (L) >=60 mL/min/BSA    ALBUMIN 2.5 (L) 3.4 - 4.8 g/dL     CALCIUM 7.6 (L) 8.8 - 10.2 mg/dL    GLUCOSE 208 (H) 65 - 125 mg/dL    ALKALINE PHOSPHATASE 70 45 - 115 U/L    ALT (SGPT)  362 (H) 10 - 55 U/L    AST (SGOT)  388 (H) 8 - 45 U/L    BILIRUBIN TOTAL 0.8 0.3 - 1.3 mg/dL    PROTEIN TOTAL 4.4 (L) 6.0 - 8.0 g/dL   CBC WITH DIFF   Result Value Ref Range    WBC 25.1 (H) 3.7 - 11.0 x103/uL    RBC 4.23 (L) 4.50 - 6.10 x106/uL    HGB 12.5 (L) 13.4 - 17.5 g/dL    HCT 38.2 (L) 38.9 - 52.0 %    MCV 90.3 78.0 - 100.0 fL    MCH 29.6 26.0 - 32.0 pg    MCHC 32.7 31.0 - 35.5 g/dL    RDW-CV 14.6 11.5 - 15.5 %    PLATELETS 193 150 - 400 x103/uL    MPV 10.2 8.7 - 12.5 fL   MANUAL DIFF AND MORPHOLOGY-SYSMEX   Result Value Ref Range    NEUTROPHIL % 77 %    LYMPHOCYTE %  4 %    MONOCYTE % 10 %     EOSINOPHIL % 0 %    BASOPHIL % 0 %    NEUTROPHIL BANDS % 7 %    METAMYELOCYTE %  2 %    NEUTROPHIL # 21.08 (H) 1.50 - 7.70 x103/uL    LYMPHOCYTE # 1.00 1.00 - 4.80 x103/uL    MONOCYTE # 2.51 (H) 0.20 - 1.10 x103/uL    EOSINOPHIL # <0.10 <=0.50 x103/uL    BASOPHIL # <0.10 <=0.20 x103/uL    ECHINOCYTE (BURR CELL) 2+/Moderate (A) None       IMAGING REVIEWED:          Earna Coder, MD PGY-1  [facility]  [phone]      I saw and examined the patient on 04/03/21.  I reviewed the resident's note.  I agree with the findings and plan of care as documented in the resident's note.  Any exceptions/additions are edited/noted.    Presented to [facility] this morning with bleeding from pancreatic tail after restarting eloquis.  Requested flight transfer to [name], however due to weather, the helicopter was unable to fly.  Taken emergently to OR with Dr. Stephanie Acre at [name] who performed ex lap and stopped the bleeding from the pancreatic parenchyma.  Oozing controlled with hemostatic agents.  Drain replaced and abdomen closed.  On arrival here, he is weaning pressors and remains intubated on modest settings.  Discussed his care with his wife.    Aaron Edelman A. Cyndi Bender, MD, FACS

## 2021-04-03 NOTE — Anesthesia Preprocedure Evaluation (Signed)
ANESTHESIA PRE-OP EVALUATION  Philippa Sicks  Planned Procedure: LAPAROTOMY EXPLORATORY (Abdomen)  Review of Systems         patient summary reviewed  nursing notes reviewed        Pulmonary   Recent PE,   Cardiovascular    Hypertension, ECG reviewed, hyperlipidemia and DVT , Exercise Tolerance: > or = 4 METS        GI/Hepatic/Renal    GERD and well controlled        Endo/Other    HIV, low viral load and CD4 > 500, hypothyroidism and anemia (9.3),      Neuro/Psych/MS        Cancer  CA (S/p Whipple, splenectomy; now with intra-abdominal bleeding),                     Physical Assessment      Airway       Mallampati: II    TM distance: >3 FB    Neck ROM: full  Mouth Opening: good.            Dental       Dentition intact             Pulmonary    Comment: Recent PE  Breath sounds clear to auscultation       Cardiovascular    Rhythm: regular  Rate: Normal       Other findings            Plan  ASA 3 - emergent     Planned anesthesia type: general     general anesthesia with endotracheal tube intubation    plan to administer opioids postoperatively            PONV/POV Plan:  I plan to administer pharmcologic prophalaxis antiemetics  Intravenous induction     Anesthesia issues/risks discussed are: Dental Injuries, Blood Loss, Art Line Placement, Sore Throat, Stroke, Cardiac Events/MI, Nerve Injuries, PONV, Intraoperative Awareness/ Recall, Post-op Pain Management, Aspiration and Central Line Placement.  Anesthetic plan and risks discussed with patient  Signed consent obtained        Use of blood products discussed with patient who consented to blood products.     Patient's NPO status is appropriate for Anesthesia.  NPO Status: Full stomach precautions.         Plan discussed with CRNA.    (Possible arterial and central line)

## 2021-04-03 NOTE — Nurses Notes (Signed)
..  Placement of Femoral CVL (Placed R) assessed per protocol.  Line transduced with pressure of < 47m Hg.    (Provider) SICU MD BOwens Shark notified of pressure.

## 2021-04-03 NOTE — ED Nurses Note (Signed)
Glucose is 239 '@0805'$ . Nurse notified

## 2021-04-03 NOTE — ED Provider Notes (Signed)
[name]  Provider Note  HPI - 04/03/2021    Name: Qscg Sbo [name]  Age and Gender: 76 y.o. male  Attending: Dr. Darlis Loan    No results found for: [place], [place], [place], [id number], [place], [place]    HPI:    [name] is a 76 y.o. male  who presents to the ED today for post op bleeding. Patient arrives hypotensive 80's/50's, per EMS report. PTA, patient reports he rolled over in his bed and noticed blood in his JP drain. 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 Patient verbalize he was having normal clear drainage throughout the day and noted the blood tonight. He complains of pain to his abdomen secondary to the surgery. Patient also reports x4 days ago he was seen and informed he had blood clots in his lungs and started on heparin and D/C to start on Eliquis. His last dose of Eliquis was last night at 2300. He denies and fever, chills, chest pain, SOB, or any other complaints at this time.     History provided by: [name]     Review of Systems:  Constitutional: Hypotensive. Post-op bleeding in JP drain. No fever, chills  Skin: No rashes or lesions  HENT: No sore throat, ear pain, or difficulty swallowing  Eyes: No vision changes, redness, discharge  Cardio: No chest pain, palpitations   Respiratory: No cough, wheezing or SOB  GI:  No nausea or vomiting. No diarrhea or constipation. No abdominal pain  GU:  No dysuria, hematuria, polyuria  MSK: No joint pain.  No neck or back pain  Neuro: No numbness, tingling, or weakness.  No headache  All other systems reviewed and are negative, unless commented on in the HPI.       Below information reviewed with patient:   No current outpatient medications on file.       Not on File       Past Medical History:  No past medical history on file.    Past Surgical History:  No past surgical history on file.    Social History:     Social History     Substance and Sexual Activity   Drug Use Not on file       Family  History:  No family history on file.      Old records reviewed.      Objective:  Nursing notes reviewed    There were no vitals filed for this visit.    Physical Exam  Nursing note and vitals reviewed.  Vital signs reviewed as above.     Constitutional: [name] is lethargic.   Head: Normocephalic and atraumatic.   ENT: Tachy mucous membranes. TMs are normal. No erythema or bulging. No pharyngeal erythema.   Eyes: Conjunctivae are normal. Pupils are equal, round, and reactive to light. EOM are intact  Neck: Soft, supple, full range of motion.  Cardiovascular: tachycardic. Regular rhythm. No Murmurs/rubs/gallops. Distal pulses present and equal bilaterally.  Pulmonary/Chest: Normal BS BL with no distress. No audible wheezes or crackles are noted.  Abdominal: Midline abdomen incision, intact staples. LUQ JP drain with bright red blood in the drain and around bandages. Distended, diffusely tender.  Musculoskeletal: 2+ radial pulses. Normal range of motion. No deformities.  Exhibits no edema  Neurological: AOx3. CNs 2-12 grossly intact.  No focal deficits noted.  Skin: Pale, diaphoretic. No rash or lesions.    Work-up:  Orders Placed This Encounter    ADULT ROUTINE BLOOD CULTURE, SET  OF 2 ADULT BOTTLES (BACTERIA AND YEAST)    ADULT ROUTINE BLOOD CULTURE, SET OF 2 ADULT BOTTLES (BACTERIA AND YEAST)    CANCELED: CT ABDOMEN PELVIS W IV CONTRAST    CT ANGIO CHEST W IV CONTRAST    CT ABDOMEN PELVIS W IV CONTRAST    PROCALCITONIN, SERUM    CBC/DIFF    COMPREHENSIVE METABOLIC PANEL, NON-FASTING    C-REACTIVE PROTEIN(CRP),INFLAMMATION    LACTIC ACID LEVEL W/ REFLEX FOR LEVEL >2.0    MAGNESIUM    PHOSPHORUS    PTT (PARTIAL THROMBOPLASTIN TIME)    [name]/INR    URINALYSIS, MACROSCOPIC    TROPONIN-I    CBC WITH DIFF    ECG 12 LEAD    TYPE AND CROSS RED CELLS - UNITS , 2 Units    human prothrombin complex [facility]) 4 factor (KCENTRA) injection    NS bolus infusion 40 mL    NS bolus infusion 2,000 mL    fentaNYL (SUBLIMAZE)  50 mcg/mL injection    ondansetron (ZOFRAN) 2 mg/mL injection    iopamidol (ISOVUE-370) 76% infusion        Labs:  Results for orders placed or performed during the hospital encounter of 04/03/21 (from the past 24 hour(s))   CBC/DIFF    Narrative    The following orders were created for panel order CBC/DIFF.  Procedure                               Abnormality         Status                     ---------                               -----------         ------                     CBC WITH XYIA[[id number]]                Abnormal            Final result                 Please view results for these tests on the individual orders.   CBC WITH DIFF   Result Value Ref Range    WBC 13.3 (H) 3.7 - 11.0 x103/uL    RBC 2.62 (L) 4.50 - 6.10 x106/uL    HGB 8.0 (L) 13.4 - 17.5 g/dL    HCT 26.9 (L) 38.9 - 52.0 %    MCV 102.7 (H) 78.0 - 100.0 fL    MCH 30.5 26.0 - 32.0 pg    MCHC 29.7 (L) 31.0 - 35.5 g/dL    RDW-CV 16.8 (H) 11.5 - 15.5 %    PLATELETS 450 (H) 150 - 400 x103/uL    MPV 10.8 8.7 - 12.5 fL    NEUTROPHIL % 60 %    LYMPHOCYTE % 33 %    MONOCYTE % 5 %    EOSINOPHIL % 0 %    BASOPHIL % 0 %    NEUTROPHIL # 7.85 (H) 1.50 - 7.70 x103/uL    LYMPHOCYTE # 4.42 1.00 - 4.80 x103/uL    MONOCYTE # 0.71 0.20 - 1.10 x103/uL    EOSINOPHIL # <0.10 <=0.50 x103/uL    BASOPHIL # <  0.10 <=0.20 x103/uL    IMMATURE GRANULOCYTE % 2 (H) 0 - 1 %    IMMATURE GRANULOCYTE # 0.31 (H) <0.10 x103/uL       Abnormal Lab results:  Labs Ordered/Reviewed   CBC WITH DIFF - Abnormal; Notable for the following components:       Result Value    WBC 13.3 (*)     RBC 2.62 (*)     HGB 8.0 (*)     HCT 26.9 (*)     MCV 102.7 (*)     MCHC 29.7 (*)     RDW-CV 16.8 (*)     PLATELETS 450 (*)     NEUTROPHIL # 7.85 (*)     IMMATURE GRANULOCYTE % 2 (*)     IMMATURE GRANULOCYTE # 0.31 (*)     All other components within normal limits   ADULT ROUTINE BLOOD CULTURE, SET OF 2 BOTTLES (BACTERIA AND YEAST)   ADULT ROUTINE BLOOD CULTURE, SET OF 2 BOTTLES (BACTERIA AND  YEAST)   CBC/DIFF    Narrative:     The following orders were created for panel order CBC/DIFF.  Procedure                               Abnormality         Status                     ---------                               -----------         ------                     CBC WITH NGEX[[id number]]                Abnormal            Final result                 Please view results for these tests on the individual orders.   PROCALCITONIN   COMPREHENSIVE METABOLIC PANEL, NON-FASTING   C-REACTIVE PROTEIN(CRP),INFLAMMATION   LACTIC ACID LEVEL W/ REFLEX FOR LEVEL >2.0   MAGNESIUM   PHOSPHORUS   PTT (PARTIAL THROMBOPLASTIN TIME)   [name]/INR   URINALYSIS, MACROSCOPIC   TROPONIN-I   TYPE AND CROSS RED CELLS - UNITS   POC BLOOD GLUCOSE (RESULTS)       Plan: Appropriate labs ordered. Medical Records reviewed.    MDM:          MDM           Most Recent EKG This Encounter   ECG 12 LEAD    Collection Time: 04/03/21  6:40 AM   Result Value    Ventricular rate 120    Atrial Rate 120    PR Interval 122    QRS Duration 66    QT Interval 320    QTC Calculation 452    Calculated P Axis 57    Calculated R Axis 41    Calculated T Axis 56    Narrative    Sinus tachycardia  Otherwise normal ECG        Care of the patient was transferred to Dr. Maida Sale at 06:58 with CT abd/pelvis, CTA chest, and lab pending.    I am scribing for,  and in the presence of, Dr. Darlis Loan for services provided on 04/03/2021.  Everlene Balls, SCRIBE     Everlene Balls, [place]  04/03/2021, 06:33      I personally performed the services described in this documentation, as scribed  in my presence, and it is both accurate  and complete.    Darlis Loan, DO  04/15/2021, 23:31        This note may have been partially generated using MModal Fluency Direct system, and there may be some incorrect words, spellings, and punctuation that were not noted in checking the note before saving

## 2021-04-03 NOTE — ED Attending Handoff Note (Signed)
Transfer of Care  Time: 07:00 on 04/03/2021    Care of patient assumed from Dr. Delene Loll at 0700 with labs and imaging pending prior to disposition.    I have assumed the care of [name] from Dr. Delene Loll 07:00 after discussing his condition and plan of care.  I will follow-up on any pending ancillary studies and provide further treatment and management of the patient as deemed necessary until their disposition from the department.      The following are pending studies and consults:    Pending Studies:   Lactic acid timed   Procalcitonin   Type and cross red cells- 2 units   Urinalysis macroscopic   CT abdomen pelvis W IV contrast   CT angio chest W IV contrast    Pertinent Imaging/Lab results:  Labs Ordered/Reviewed   COMPREHENSIVE METABOLIC PANEL, NON-FASTING - Abnormal; Notable for the following components:       Result Value    CO2 TOTAL 14 (*)     ANION GAP 20 (*)     CREATININE 1.47 (*)     ESTIMATED GFR 49 (*)     ALBUMIN 2.3 (*)     GLUCOSE 261 (*)     PROTEIN TOTAL 4.7 (*)     All other components within normal limits   C-REACTIVE PROTEIN(CRP),INFLAMMATION - Abnormal; Notable for the following components:    CRP INFLAMMATION 42.9 (*)     All other components within normal limits   LACTIC ACID LEVEL W/ REFLEX FOR LEVEL >2.0 - Abnormal; Notable for the following components:    LACTIC ACID 12.1 (*)     All other components within normal limits   PHOSPHORUS - Abnormal; Notable for the following components:    PHOSPHORUS 4.9 (*)     All other components within normal limits   PT/INR - Abnormal; Notable for the following components:    PROTHROMBIN TIME 17.9 (*)     All other components within normal limits   CBC WITH DIFF - Abnormal; Notable for the following components:    WBC 13.3 (*)     RBC 2.62 (*)     HGB 8.0 (*)     HCT 26.9 (*)     MCV 102.7 (*)     MCHC 29.7 (*)     RDW-CV 16.8 (*)     PLATELETS 450 (*)     NEUTROPHIL # 7.85 (*)     IMMATURE GRANULOCYTE % 2 (*)     IMMATURE GRANULOCYTE  # 0.31 (*)     All other components within normal limits   PROCALCITONIN - Normal   MAGNESIUM - Normal   PTT (PARTIAL THROMBOPLASTIN TIME) - Normal   TROPONIN-I - Normal   ADULT ROUTINE BLOOD CULTURE, SET OF 2 BOTTLES (BACTERIA AND YEAST)   ADULT ROUTINE BLOOD CULTURE, SET OF 2 BOTTLES (BACTERIA AND YEAST)   CBC/DIFF    Narrative:     The following orders were created for panel order CBC/DIFF.  Procedure                               Abnormality         Status                     ---------                               -----------         ------  CBC WITH DIFF[[id number]]                Abnormal            Final result                 Please view results for these tests on the individual orders.   URINALYSIS, MACROSCOPIC   LACTIC ACID TIMED   TYPE AND CROSS RED CELLS - UNITS   PRODUCT: FFP/PLASMA - UNITS   PRODUCT: PLATELETS - UNITS   PRODUCT: CRYOPRECIPITATE - UNITS   BPAM PACKED CELL ORDER   PRODUCT: FFP/PLASMA - UNITS   PRODUCT: CRYOPRECIPITATE - UNITS   CROSSMATCH RED CELLS - UNITS   CROSSMATCH RED CELLS - UNITS   POC BLOOD GLUCOSE (RESULTS)       Imaging:  Results for orders placed or performed during the hospital encounter of 04/03/21 (from the past 72 hour(s))   CT ABDOMEN PELVIS W IV CONTRAST     Status: None    Narrative    Male, 76 years old.    CT ABDOMEN PELVIS W IV CONTRAST performed on 04/03/2021 6:56 AM.    REASON FOR EXAM:  Undifferentiated shock, recent splenectomy, suspected bleed, recent PE  RADIATION DOSE: 2014 DLP  CONTRAST: 100 ml's of Isovue 370    TECHNIQUE: Enhanced 5 mm transaxial and sagittal coronal reconstruction and performed through the abdomen and pelvis.   The CT scanner is equipped with dose reducing technology. The MAS is automatically adjusted to the patient body size in order to deliver the lowest dose possible.    COMPARISON: None    FINDINGS: There is a drainage catheter in the left upper quadrant. There is fluid around the drainage catheter. There is been  resection of the distal aspect of the pancreas. The fluid around the pancreas.    There is fluid around the liver and in the posterior aspect abdomen and retroperitoneum. The fluid is of increased attenuation suggesting blood.    There is a small hypoattenuating abnormality in the liver.     There is likely extravasation contrast at the level of the resection of the tail the pancreas in the body of the pancreas.    No abnormal bowel dilatation is seen. No free fluid is noted in the pelvis. The kidneys show symmetric functioning.      Impression    1. ABNORMAL EXAM: Patient has active extravasation of contrast at the resection of the pancreas. The findings concerning for active bleeding. The findings were called directly to Dr. Delene Loll of the emergency Department.  2. There is evidence for blood around the liver.      Radiologist location ID: [id number]     CT ANGIO CHEST W IV CONTRAST     Status: None    Narrative    Male, 76 years old.    CT ANGIO CHEST W IV CONTRAST performed on 04/03/2021 7:01 AM.    REASON FOR EXAM:  Undifferentiated shock. Patient status post splenectomy M distal pancreatectomy. Patient has bleeding of the J-tube.  RADIATION DOSE: 414 DLP  CONTRAST: 100 ml's of Isovue 370    TECHNIQUE: Enhanced 3 mm transaxial and sagittal coronal reconstruction imaging performed through the chest. 3-D angiography performed.   The CT scanner is equipped with dose reducing technology. The MAS is automatically adjusted to the patient body size in order to deliver the lowest dose possible.    COMPARISON: None    FINDINGS: There is a filling defect in the  pulmonary going to the right upper lobe and right lower lobe. The main pulmonary and right left pulmonary artery. There is no evidence for heart strain.    The lung window images show subpleural interstitial changes posteriorly in both lower lobes likely relating atelectasis.      Impression    1. There are small pulmonary emboli identified in the pulmonary going the  right upper lobe and right lower lobe. No heart strain is seen. The findings are called to Dr. Delene Loll at the time of the exam.      Radiologist location ID: [facility]         EKG:   Most Recent EKG This Encounter   ECG 12 LEAD    Collection Time: 04/03/21  6:40 AM   Result Value    Ventricular rate 120    Atrial Rate 120    PR Interval 122    QRS Duration 66    QT Interval 320    QTC Calculation 452    Calculated P Axis 57    Calculated R Axis 41    Calculated T Axis 56    Narrative    Sinus tachycardia  Otherwise normal ECG  NO STEMI  Reviewed by Darlis Loan at [phone]  Confirmed by Darlis Loan [phone]), editor Floy Sabina [phone]) on 04/03/2021 9:02:23 AM       Plan: Appropriate labs and imaging ordered. Medical Records reviewed.    MDM:   During the patient's stay in the emergency department, the above listed information was used to assist with medical decision making and were reviewed by myself when available for review.        Medical Decision Making  See HPI, PMHx , PE, and ROS for additional information.     Patient care endorsed to me at 7:00 a.m. shift change.  Patient presents to ED with postoperative bleeding.  Patient had recent splenectomy/pancreatic tail removal for pancreatic cyst.  Patient had subsequent postoperative pulmonary embolism.  Patient on Eliquis with spontaneous bleeding this morning.  Upon my evaluation the patient was tachycardic, hypertensive heart rate 140s, blood pressure in 90s.  Massive transfusion protocol was ordered.  CT scan showed bleeding in the pancreatic bed area.  Patient was reversed with Kcentra and TXA.  Initially we attempted to fly the patient to [facility] ED.  However weather was not permitting flight.  Given the fact the patient was requiring active blood products, we felt the patient was too unstable for ground transport.  Local General surgery took the patient to the operating room for exploration to attempt to control bleeding.    Amount and/or Complexity of  Data Reviewed  Discussion of management or test interpretation with external provider(s): Spoke with [facility] transfer line. Patient is accepted to the ED under Dr. Cameron Sprang. Surgical Oncologist will be Dr. Tish Frederickson.     Spoke with Dr. Stephanie Acre at [phone]. States that she will operate on patient to stabilize patient for transportation.     Risk  Prescription drug management.  Parenteral controlled substances.      Critical Care  Total time providing critical care: 60 minutes      Procedures:  Encounter Date: 04/03/2021     Emergency Department Procedure:    Central Line Placement  Procedure performed at: 0730  Local Anesthesia: Lidocaine Plain, 1% (3 cc)  Skin was prepped with: Betadine  Location: Right, Femoral  Device: Trauma Cordis  Procedural Documentation: Skin was prepped and draped, Sterile technique was used,  Blood easily withdrawn, Syringe was removed, Guidewire was advanced, Using blade an incision  was made in the skin, Dilator was advanced and then removed, Easily advanced, Catheter advanced over guidewire, Guidewire was removed, Flushed lumen with Normal Saliine, IV fluids flowed freely through the catheter, Catheter was sutured into place, Sterile dressing was applied, Maximum sterile barrier technique utilized  Ultrasound Guidance Required?: Yes  Attending Physician:  (Dr. Maida Sale)    Impression:  Clinical Impression   Bleeding - acute post op bleeding from pancreatic bed (Primary)       Dispo:  To the OR     Patient will be admitted to medicine service for further evaluation and management.        5188: Care of patient will be admitted to Dr. Stephanie Acre at this time.  They were made aware of       history/physical, relevant labs/imaging and pending studies. The following is still pending at time of Transfer of Care:    The patient's wife verbalized understanding of all instructions and had no further questions or concerns.     I am scribing for, and in the presence of, Dr. Maida Sale  for services provided on 04/03/2021.  Floy Sabina, [place], [place]  04/03/2021, 07:20    I personally performed the services described in this documentation, as scribed  in my presence, and it is both accurate  and complete.    Karie Kirks, MD  Karie Kirks, MD  04/03/2021, 12:07

## 2021-04-03 NOTE — Procedures (Signed)
Encounter Date: 04/03/2021     Emergency Department Procedure:    Central Line Placement  Procedure performed at: 0730  Local Anesthesia: Lidocaine Plain, 1% (3 cc)  Skin was prepped with: Betadine  Location: Right, Femoral  Device: Trauma Cordis  Procedural Documentation: Skin was prepped and draped, Sterile technique was used, Blood easily withdrawn, Syringe was removed, Guidewire was advanced, Using blade an incision  was made in the skin, dilator was inserted into the catheter, they were both then threaded over the guidewire without difficulty., Guidewire and dilator were removed, Flushed lumen with Normal Saliine, IV fluids flowed freely through the catheter, Catheter was sutured into place, Sterile dressing was applied, Maximum sterile barrier technique utilized  Ultrasound Guidance Required?: Yes    Attending Physician:  (Dr. Maida Sale)    I personally performed the services described in this documentation, as scribed  in my presence, and it is both accurate  and complete.    Karie Kirks, MD  Karie Kirks, MD  04/03/2021, 14:24

## 2021-04-04 DIAGNOSIS — R1011 Right upper quadrant pain: Secondary | ICD-10-CM

## 2021-04-04 DIAGNOSIS — R58 Hemorrhage, not elsewhere classified: Secondary | ICD-10-CM

## 2021-04-04 DIAGNOSIS — R000 Tachycardia, unspecified: Secondary | ICD-10-CM

## 2021-04-04 DIAGNOSIS — D62 Acute posthemorrhagic anemia: Secondary | ICD-10-CM

## 2021-04-04 DIAGNOSIS — Z90411 Acquired partial absence of pancreas: Secondary | ICD-10-CM

## 2021-04-04 DIAGNOSIS — Z4682 Encounter for fitting and adjustment of non-vascular catheter: Secondary | ICD-10-CM

## 2021-04-04 DIAGNOSIS — N179 Acute kidney failure, unspecified: Secondary | ICD-10-CM

## 2021-04-04 DIAGNOSIS — Z7901 Long term (current) use of anticoagulants: Secondary | ICD-10-CM

## 2021-04-04 DIAGNOSIS — E872 Acidosis, unspecified: Secondary | ICD-10-CM

## 2021-04-04 DIAGNOSIS — Z86711 Personal history of pulmonary embolism: Secondary | ICD-10-CM

## 2021-04-04 DIAGNOSIS — Z9081 Acquired absence of spleen: Secondary | ICD-10-CM

## 2021-04-04 LAB — CBC WITH DIFF
HCT: 30.4 % — ABNORMAL LOW (ref 38.9–52.0)
HGB: 10.8 g/dL — ABNORMAL LOW (ref 13.4–17.5)
MCH: 29.3 pg (ref 26.0–32.0)
MCHC: 35.5 g/dL (ref 31.0–35.5)
MCV: 82.4 fL (ref 78.0–100.0)
MPV: 10.6 fL (ref 8.7–12.5)
PLATELETS: 208 10*3/uL (ref 150–400)
RBC: 3.69 10*6/uL — ABNORMAL LOW (ref 4.50–6.10)
RDW-CV: 15.7 % — ABNORMAL HIGH (ref 11.5–15.5)
WBC: 17.4 10*3/uL — ABNORMAL HIGH (ref 3.7–11.0)

## 2021-04-04 LAB — MANUAL DIFF AND MORPHOLOGY-SYSMEX
BASOPHIL #: <0.1 10*3/uL (ref <=0.20)
BASOPHIL %: 0 %
EOSINOPHIL #: <0.1 10*3/uL (ref <=0.50)
EOSINOPHIL %: 0 %
LYMPHOCYTE #: 0.7 10*3/uL — ABNORMAL LOW (ref 1.00–4.80)
LYMPHOCYTE %: 4 %
MONOCYTE #: 0.52 10*3/uL (ref 0.20–1.10)
MONOCYTE %: 3 %
NEUTROPHIL #: 16.36 10*3/uL — ABNORMAL HIGH (ref 1.50–7.70)
NEUTROPHIL %: 94 %
NRBC FROM MANUAL DIFF: 6 per 100 WBC
RBC MORPHOLOGY: NORMAL

## 2021-04-04 LAB — BASIC METABOLIC PANEL
ANION GAP: 5 mmol/L (ref 4–13)
BUN/CREA RATIO: 12 (ref 6–22)
BUN: 16 mg/dL (ref 8–25)
CALCIUM: 6.9 mg/dL — ABNORMAL LOW (ref 8.8–10.2)
CHLORIDE: 114 mmol/L — ABNORMAL HIGH (ref 96–111)
CO2 TOTAL: 24 mmol/L (ref 23–31)
CREATININE: 1.38 mg/dL — ABNORMAL HIGH (ref 0.75–1.35)
ESTIMATED GFR: 53 mL/min/BSA — ABNORMAL LOW (ref >=60)
GLUCOSE: 68 mg/dL (ref 65–125)
POTASSIUM: 3.3 mmol/L — ABNORMAL LOW (ref 3.5–5.1)
SODIUM: 143 mmol/L (ref 136–145)

## 2021-04-04 LAB — H & H
HCT: 30.4 % — ABNORMAL LOW (ref 38.9–52.0)
HGB: 10.8 g/dL — ABNORMAL LOW (ref 13.4–17.5)

## 2021-04-04 LAB — ECG 12-LEAD
Atrial Rate: 117 {beats}/min
Calculated P Axis: 52 degrees
Calculated R Axis: 19 degrees
Calculated T Axis: 48 degrees
PR Interval: 130 ms
QRS Duration: 72 ms
QT Interval: 346 ms
QTC Calculation: 482 ms
Ventricular rate: 117 {beats}/min

## 2021-04-04 LAB — POC BLOOD GLUCOSE (RESULTS)
GLUCOSE, POC: 71 mg/dl (ref 70–105)
GLUCOSE, POC: 90 mg/dl (ref 70–105)
GLUCOSE, POC: 146 mg/dl — ABNORMAL HIGH (ref 70–105)

## 2021-04-04 LAB — TROPONIN-I
TROPONIN I: 30 ng/L (ref 0–30)
TROPONIN I: 18 ng/L (ref 0–30)
TROPONIN I: 13 ng/L (ref 0–30)

## 2021-04-04 LAB — HEPATIC FUNCTION PANEL
ALBUMIN: 2 g/dL — ABNORMAL LOW (ref 3.4–4.8)
ALKALINE PHOSPHATASE: 59 U/L (ref 45–115)
ALT (SGPT): 215 U/L — ABNORMAL HIGH (ref 10–55)
AST (SGOT): 251 U/L — ABNORMAL HIGH (ref 8–45)
BILIRUBIN DIRECT: 0.2 mg/dL (ref 0.1–0.4)
BILIRUBIN TOTAL: 0.3 mg/dL (ref 0.3–1.3)
PROTEIN TOTAL: 3.8 g/dL — ABNORMAL LOW (ref 6.0–8.0)

## 2021-04-04 LAB — ARTERIAL BLOOD GAS WITH LACTATE REFLEX
%FIO2 (ARTERIAL): 36 %
BASE EXCESS (ARTERIAL): 1.2 mmol/L — ABNORMAL HIGH (ref 0.0–1.0)
BICARBONATE (ARTERIAL): 25.8 mmol/L (ref 18.0–26.0)
LACTATE: 0.8 mmol/L (ref 0.0–1.3)
PAO2/FIO2 RATIO: 214 (ref <=200)
PCO2 (ARTERIAL): 41 mm/Hg (ref 35–45)
PH (ARTERIAL): 7.41 (ref 7.35–7.45)
PO2 (ARTERIAL): 77 mm/Hg (ref 72–100)

## 2021-04-04 LAB — PHOSPHORUS: PHOSPHORUS: 4.1 mg/dL — ABNORMAL HIGH (ref 2.3–4.0)

## 2021-04-04 LAB — MAGNESIUM: MAGNESIUM: 1.5 mg/dL — ABNORMAL LOW (ref 1.8–2.6)

## 2021-04-04 MED ORDER — SCOPOLAMINE 1 MG OVER 3 DAYS TRANSDERMAL PATCH
1.0000 | MEDICATED_PATCH | TRANSDERMAL | Status: DC
Start: 2021-04-04 — End: 2021-04-11
  Administered 2021-04-04 – 2021-04-07 (×2): 1 via TRANSDERMAL
  Administered 2021-04-10: 0 via TRANSDERMAL
  Filled 2021-04-04 (×2): qty 1

## 2021-04-04 MED ORDER — GABAPENTIN 300 MG CAPSULE
300.0000 mg | ORAL_CAPSULE | Freq: Three times a day (TID) | ORAL | Status: DC
Start: 2021-04-04 — End: 2021-05-11
  Administered 2021-04-04 (×2): 0 mg via ORAL
  Administered 2021-04-05 – 2021-04-09 (×14): 300 mg via ORAL
  Administered 2021-04-09: 0 mg via ORAL
  Administered 2021-04-10 – 2021-04-12 (×8): 300 mg via ORAL
  Administered 2021-04-12: 0 mg via ORAL
  Administered 2021-04-13 – 2021-04-16 (×11): 300 mg via ORAL
  Administered 2021-04-16: 0 mg via ORAL
  Administered 2021-04-17 – 2021-04-23 (×21): 300 mg via ORAL
  Administered 2021-04-24: 0 mg via ORAL
  Administered 2021-04-24 – 2021-04-25 (×4): 300 mg via ORAL
  Administered 2021-04-25: 0 mg via ORAL
  Administered 2021-04-26 – 2021-05-04 (×27): 300 mg via ORAL
  Administered 2021-05-05 (×2): 0 mg via ORAL
  Administered 2021-05-05: 300 mg via ORAL
  Administered 2021-05-06: 0 mg via ORAL
  Administered 2021-05-06: 300 mg via ORAL
  Administered 2021-05-06: 0 mg via ORAL
  Administered 2021-05-07 – 2021-05-11 (×13): 300 mg via ORAL
  Filled 2021-04-04 (×105): qty 1

## 2021-04-04 MED ORDER — MAGNESIUM SULFATE 4 GRAM/100 ML (4 %) IN WATER INTRAVENOUS PIGGYBACK
4.0000 g | INJECTION | Freq: Once | INTRAVENOUS | Status: AC
Start: 2021-04-04 — End: 2021-04-04
  Administered 2021-04-04: 4 g via INTRAVENOUS
  Administered 2021-04-04: 0 g via INTRAVENOUS
  Filled 2021-04-04: qty 100

## 2021-04-04 MED ORDER — ELECTROLYTE-A INTRAVENOUS SOLUTION
INTRAVENOUS | Status: DC
Start: 2021-04-04 — End: 2021-04-10
  Administered 2021-04-06 – 2021-04-10 (×2): 0 mL via INTRAVENOUS

## 2021-04-04 MED ORDER — METOPROLOL TARTRATE 5 MG/5 ML INTRAVENOUS SOLUTION
2.5000 mg | INTRAVENOUS | Status: AC
Start: 2021-04-04 — End: 2021-04-04
  Administered 2021-04-04: 2.5 mg via INTRAVENOUS
  Filled 2021-04-04: qty 5

## 2021-04-04 MED ORDER — ONDANSETRON HCL (PF) 4 MG/2 ML INJECTION SOLUTION
4.0000 mg | Freq: Four times a day (QID) | INTRAMUSCULAR | Status: DC | PRN
Start: 2021-04-04 — End: 2021-05-15
  Administered 2021-04-04 – 2021-04-21 (×24): 4 mg via INTRAVENOUS
  Administered 2021-04-22: 0 mg via INTRAVENOUS
  Administered 2021-04-22 – 2021-04-23 (×3): 4 mg via INTRAVENOUS
  Administered 2021-04-23: 0 mg via INTRAVENOUS
  Administered 2021-04-23 – 2021-05-15 (×38): 4 mg via INTRAVENOUS
  Filled 2021-04-04 (×67): qty 2

## 2021-04-04 MED ORDER — HEPARIN (PORCINE) 5,000 UNIT/ML INJECTION SOLUTION
5000.0000 [IU] | Freq: Three times a day (TID) | INTRAMUSCULAR | Status: DC
Start: 2021-04-04 — End: 2021-04-06
  Administered 2021-04-04 – 2021-04-06 (×6): 5000 [IU] via SUBCUTANEOUS
  Filled 2021-04-04 (×6): qty 1

## 2021-04-04 NOTE — Procedures (Signed)
[facility] Line Removal    Procedure Date: 04/04/2021 Time: 10:10   Procedure: Central Line Removal  Diagnosis: Intra abdominal hemorrhage    Description: A single lumen central line was removed from the right femoral vein with patient in semi fowlers position after sutures had been removed and alternate IV access had been obtained.  Pressure was then held for 15 minutes and the area was covered with gauze and pressure dressing.  The patient tolerated this procedure without complications.     Ranae Palms, DO 04/04/2021, 10:10

## 2021-04-04 NOTE — Nurses Notes (Signed)
Pt tachycardic into 150's for 8 seconds and remaining in HR of 120's-140s. SICU Voges at bedside. STAT EKG and troponin ordered and obtained. Pt not complaining of any pain at this time. 2.'5mg'$  of metoprolol ordered and administered. Will continue to monitor.

## 2021-04-04 NOTE — Progress Notes (Signed)
[facility]                                               SICU PROGRESS NOTE    [name], [name]  Date of Admission:  04/03/2021  Date of Service: 04/04/2021  Date of Birth:  1945-05-08    Primary Attending:  Dr.Boone  Primary Service:  Surg Onc    Post Op Day:  S/P      LOS: 1 day      Subjective:    76 yo m PMH prostate ca, HLD, HTN, HIV, hypothyroidism transfer from [facility] with active contrast extravasation at the surgical bed from recent ex lap , distal pancreatectomy, and splenectomy for IPMN lesion.     Extubated yesterday evening. Currently on NC at 4 lpm.Sitting upright in bed this morning. Some moderate abdominal pain and nausea.       Vital Signs:  Temp (24hrs) Max:36.9 C (09.3 F)      Systolic (81WEX), HBZ:169 , Min:67 , CVE:938     Diastolic (10FBP), ZWC:58, Min:53, Max:124    Temp  Avg: 35.4 C (95.8 F)  Min: 34.5 C (94.1 F)  Max: 36.9 C (98.4 F)  MAP (Non-Invasive)  Avg: 88.6 mmHG  Min: 62 mmHG  Max: 135 mmHG  Pulse  Avg: 115.1  Min: 105  Max: 128  Resp  Avg: 23.6  Min: 14  Max: 43  SpO2  Avg: 93.4 %  Min: 90 %  Max: 99 %       Meds  No current outpatient medications on file.     acetaminophen (TYLENOL) tablet, 650 mg, Oral, Q4H  atorvastatin (LIPITOR) tablet, 40 mg, Oral, Daily with Breakfast  docusate sodium (COLACE) 5m per mL oral liquid, 100 mg, Oral, 2x/day  elvitegravir-cobicistat-emtricitrabine-tenofovir (GENVOYA) 150-150-200-10 mg per tablet, 1 Tablet, Oral, Daily  HYDROmorphone (DILAUDID) 1 mg/mL injection, 0.2 mg, Intravenous, Q3H PRN  levothyroxine (SYNTHROID) tablet, 88 mcg, Oral, QAM  norepinephrine (LEVOPHED) 16 mg in NS 2558mpremix infusion, 0.03 mcg/kg/min, Intravenous, Continuous  ondansetron (ZOFRAN) 2 mg/mL injection, 4 mg, Intravenous, Q8H PRN  oxyCODONE (ROXICODONE) immediate release tablet, 5 mg, Oral, Q4H PRN  oxyCODONE (ROXICODONE) immediate releaste tablet, 10 mg, Oral, Q4H PRN  pantoprazole (PROTONIX) delayed release tablet, 40 mg, Oral, Daily  before Breakfast  senna concentrate (SENNA) 52819mer 5m49mal liquid, 10 mL, Oral, 2x/day  SSIP insulin R human (HUMULIN R) 100 units/mL injection, 0-12 Units, Subcutaneous, Q6H PRN        Physical Exam:   General:  NAD. Sitting upright in bed  Eyes:  Conjunctiva clear  HENT:  NCAT, Mucous membranes moist  Lungs:  CTAB, Normal respiratory effort on 4 lpm NC  Cardiovascular:  RR  Abdomen:  S, tender, some distention .  Extremities:  No cyanosis or edema.  Skin:  Skin warm and dry.  Neurologic:  Alert and oriented x3, grossly normal  Psychiatric:  Affect normal.    Labs:  Lab Results Today:    Results for orders placed or performed during the hospital encounter of 04/03/21 (from the past 24 hour(s))   BASIC METABOLIC PANEL   Result Value Ref Range    SODIUM 143 136 - 145 mmol/L    POTASSIUM 4.1 3.5 - 5.1 mmol/L    CHLORIDE 110 96 - 111 mmol/L    CO2 TOTAL 14 (L)  23 - 31 mmol/L    ANION GAP 19 (H) 4 - 13 mmol/L    CALCIUM 8.0 (L) 8.8 - 10.2 mg/dL    GLUCOSE 281 (H) 65 - 125 mg/dL    BUN 17 8 - 25 mg/dL    CREATININE 1.39 (H) 0.75 - 1.35 mg/dL    BUN/CREA RATIO 12 6 - 22    ESTIMATED GFR 53 (L) >=60 mL/min/BSA   TROPONIN-I   Result Value Ref Range    TROPONIN I 21 0 - 30 ng/L   TEG, RAPID GLOBAL WITH LYSIS (TRAUMA)   Result Value Ref Range    R (CK) 7.7 4.6 - 9.1 min    LYS30 (%) 0.0 0.0 - 2.6 %    MA (CRT RAPID) 65.4 52.0 - 70.0 mm    MA FIBRINOGEN (CFF) 31.9 15.0 - 32.0 mm   PT/INR   Result Value Ref Range    PROTHROMBIN TIME 15.2 (H) 9.1 - 13.9 seconds    INR 1.31 (H) 0.80 - 1.20   PTT (PARTIAL THROMBOPLASTIN TIME)   Result Value Ref Range    APTT 32.3 24.2 - 37.5 seconds   ALT (SGPT)   Result Value Ref Range    ALT (SGPT) 335 (H) 10 - 55 U/L   AST (SGOT)   Result Value Ref Range    AST (SGOT)  382 (H) 8 - 45 U/L   BILIRUBIN TOTAL   Result Value Ref Range    BILIRUBIN TOTAL 0.9 0.3 - 1.3 mg/dL   ALK PHOS (ALKALINE PHOSPHATASE)   Result Value Ref Range    ALKALINE PHOSPHATASE 70 45 - 115 U/L   GAMMA GT   Result  Value Ref Range    GGT 57 (H) 7 - 50 U/L   PROTEIN TOTAL   Result Value Ref Range    PROTEIN TOTAL 4.8 (L) 6.0 - 8.0 g/dL   CBC WITH DIFF   Result Value Ref Range    WBC 21.9 (H) 3.7 - 11.0 x10^3/uL    RBC 4.28 (L) 4.50 - 6.10 x10^6/uL    HGB 12.9 (L) 13.4 - 17.5 g/dL    HCT 36.6 (L) 38.9 - 52.0 %    MCV 85.5 78.0 - 100.0 fL    MCH 30.1 26.0 - 32.0 pg    MCHC 35.2 31.0 - 35.5 g/dL    RDW-CV 14.6 11.5 - 15.5 %    PLATELETS 155 150 - 400 x10^3/uL    MPV 11.3 8.7 - 12.5 fL   MANUAL DIFF AND MORPHOLOGY-SYSMEX   Result Value Ref Range    NEUTROPHIL % 96 %    LYMPHOCYTE %  2 %    MONOCYTE % 2 %    EOSINOPHIL % 0 %    BASOPHIL % 0 %    METAMYELOCYTE %  1 %    NEUTROPHIL # 21.02 (H) 1.50 - 7.70 x10^3/uL    LYMPHOCYTE # 0.44 (L) 1.00 - 4.80 x10^3/uL    MONOCYTE # 0.44 0.20 - 1.10 x10^3/uL    EOSINOPHIL # <0.10 <=0.50 x10^3/uL    BASOPHIL # <0.10 <=0.20 x10^3/uL    ECHINOCYTE (BURR CELL) 2+/Moderate (A) None    TOXIC GRANULATION Present (A) None   TYPE AND CROSS RED CELLS - UNITS , 3 Units   Result Value Ref Range    UNITS ORDERED 3     SPECIMEN EXPIRATION DATE 04/06/2021,2359     ABO/RH(D) A NEGATIVE     ANTIBODY SCREEN NEGATIVE    BPAM  PACKED CELL ORDER   Result Value Ref Range    Coding System [id number]     UNIT NUMBER [id number]     BLOOD COMPONENT TYPE LR RBC, Adsol3, 04761     UNIT DIVISION 00     UNIT DISPENSE STATUS ALLOCATED     TRANSFUSION STATUS OK TO TRANSFUSE     IS CROSSMATCH Electronically Compatible     Product Code [phone]     Coding System [id number]     UNIT NUMBER [id number]     BLOOD COMPONENT TYPE LR RBC, Adsol1, 04710     UNIT DIVISION 00     UNIT DISPENSE STATUS ALLOCATED     TRANSFUSION STATUS OK TO TRANSFUSE     IS CROSSMATCH Electronically Compatible     Product Code [phone]     Coding System [id number]     UNIT NUMBER [id number]     BLOOD COMPONENT TYPE LR RBC, Adsol1, 04710     UNIT DIVISION 00     UNIT DISPENSE STATUS ALLOCATED     TRANSFUSION STATUS OK TO TRANSFUSE     IS CROSSMATCH  Electronically Compatible     Product Code [id number]    ARTERIAL BLOOD GAS WITH LACTATE REFLEX - PLEASE OBTAIN 30 MINUTES AFTER ARRIVAL TO UNIT   Result Value Ref Range    %FIO2 (ARTERIAL) 45 %    PH (ARTERIAL) 7.33 (L) 7.35 - 7.45    PCO2 (ARTERIAL) 33 (L) 35 - 45 mm/Hg    PO2 (ARTERIAL) 73 72 - 100 mm/Hg    BASE DEFICIT 7.5 (H) 0.0 - 3.0 mmol/L    BICARBONATE (ARTERIAL) 19.0 18.0 - 26.0 mmol/L    LACTATE 7.1 (H) 0.0 - 1.3 mmol/L    PAO2/FIO2 RATIO 162 <=200   LACTIC ACID TIMED   Result Value Ref Range    LACTIC ACID 4.0 (HH) 0.5 - 2.2 mmol/L   ARTERIAL BLOOD GAS WITH LACTATE REFLEX   Result Value Ref Range    %FIO2 (ARTERIAL) 36 %    PH (ARTERIAL) 7.42 7.35 - 7.45    PCO2 (ARTERIAL) 36 35 - 45 mm/Hg    PO2 (ARTERIAL) 66 (L) 72 - 100 mm/Hg    BASE DEFICIT 0.7 0.0 - 3.0 mmol/L    BICARBONATE (ARTERIAL) 24.3 18.0 - 26.0 mmol/L    LACTATE 3.1 (H) 0.0 - 1.3 mmol/L    PAO2/FIO2 RATIO 183 <=200   H & H   Result Value Ref Range    HGB 11.0 (L) 13.4 - 17.5 g/dL    HCT 30.4 (L) 38.9 - 52.0 %   POC BLOOD GLUCOSE (RESULTS)   Result Value Ref Range    GLUCOSE, POC 166 (H) 70 - 105 mg/dl   TROPONIN-I   Result Value Ref Range    TROPONIN I 15 0 - 30 ng/L   H & H   Result Value Ref Range    HGB 11.4 (L) 13.4 - 17.5 g/dL    HCT 31.3 (L) 38.9 - 52.0 %   ARTERIAL BLOOD GAS WITH LACTATE REFLEX   Result Value Ref Range    %FIO2 (ARTERIAL) 36 %    PH (ARTERIAL) 7.42 7.35 - 7.45    PCO2 (ARTERIAL) 37 35 - 45 mm/Hg    PO2 (ARTERIAL) 75 72 - 100 mm/Hg    BASE DEFICIT 0.2 0.0 - 3.0 mmol/L    BICARBONATE (ARTERIAL) 24.7 18.0 - 26.0 mmol/L    LACTATE 1.3 0.0 - 1.3 mmol/L    PAO2/FIO2 RATIO 208 <=  200   H & H   Result Value Ref Range    HGB 11.4 (L) 13.4 - 17.5 g/dL    HCT 31.6 (L) 38.9 - 52.0 %   TROPONIN-I   Result Value Ref Range    TROPONIN I 30 0 - 30 ng/L   ARTERIAL BLOOD GAS WITH LACTATE REFLEX   Result Value Ref Range    %FIO2 (ARTERIAL) 36 %    PH (ARTERIAL) 7.41 7.35 - 7.45    PCO2 (ARTERIAL) 41 35 - 45 mm/Hg    PO2 (ARTERIAL) 77 72 -  100 mm/Hg    BASE EXCESS (ARTERIAL) 1.2 (H) 0.0 - 1.0 mmol/L    BICARBONATE (ARTERIAL) 25.8 18.0 - 26.0 mmol/L    LACTATE 0.8 0.0 - 1.3 mmol/L    PAO2/FIO2 RATIO 214 <=200   H & H   Result Value Ref Range    HGB 10.8 (L) 13.4 - 17.5 g/dL    HCT 30.4 (L) 38.9 - 52.0 %   BASIC METABOLIC PANEL   Result Value Ref Range    SODIUM 143 136 - 145 mmol/L    POTASSIUM 3.3 (L) 3.5 - 5.1 mmol/L    CHLORIDE 114 (H) 96 - 111 mmol/L    CO2 TOTAL 24 23 - 31 mmol/L    ANION GAP 5 4 - 13 mmol/L    CALCIUM 6.9 (L) 8.8 - 10.2 mg/dL    GLUCOSE 68 65 - 125 mg/dL    BUN 16 8 - 25 mg/dL    CREATININE 1.38 (H) 0.75 - 1.35 mg/dL    BUN/CREA RATIO 12 6 - 22    ESTIMATED GFR 53 (L) >=60 mL/min/BSA   PHOSPHORUS   Result Value Ref Range    PHOSPHORUS 4.1 (H) 2.3 - 4.0 mg/dL   MAGNESIUM   Result Value Ref Range    MAGNESIUM 1.5 (L) 1.8 - 2.6 mg/dL   HEPATIC FUNCTION PANEL   Result Value Ref Range    ALBUMIN 2.0 (L) 3.4 - 4.8 g/dL     ALKALINE PHOSPHATASE 59 45 - 115 U/L    ALT (SGPT) 215 (H) 10 - 55 U/L    AST (SGOT)  251 (H) 8 - 45 U/L    BILIRUBIN TOTAL 0.3 0.3 - 1.3 mg/dL    BILIRUBIN DIRECT 0.2 0.1 - 0.4 mg/dL    PROTEIN TOTAL 3.8 (L) 6.0 - 8.0 g/dL   CBC WITH DIFF   Result Value Ref Range    WBC 17.4 (H) 3.7 - 11.0 x10^3/uL    RBC 3.69 (L) 4.50 - 6.10 x10^6/uL    HGB 10.8 (L) 13.4 - 17.5 g/dL    HCT 30.4 (L) 38.9 - 52.0 %    MCV 82.4 78.0 - 100.0 fL    MCH 29.3 26.0 - 32.0 pg    MCHC 35.5 31.0 - 35.5 g/dL    RDW-CV 15.7 (H) 11.5 - 15.5 %    PLATELETS 208 150 - 400 x10^3/uL    MPV 10.6 8.7 - 12.5 fL   MANUAL DIFF AND MORPHOLOGY-SYSMEX   Result Value Ref Range    NEUTROPHIL % 94 %    LYMPHOCYTE %  4 %    MONOCYTE % 3 %    EOSINOPHIL % 0 %    BASOPHIL % 0 %    NEUTROPHIL # 16.36 (H) 1.50 - 7.70 x10^3/uL    LYMPHOCYTE # 0.70 (L) 1.00 - 4.80 x10^3/uL    MONOCYTE # 0.52 0.20 - 1.10 x10^3/uL  EOSINOPHIL # <0.10 <=0.50 x10^3/uL    BASOPHIL # <0.10 <=0.20 x10^3/uL    NRBC FROM MANUAL DIFF 6 per 100 WBC    RBC MORPHOLOGY Normal RBC and PLT  Morphology    POC BLOOD GLUCOSE (RESULTS)   Result Value Ref Range    GLUCOSE, POC 71 70 - 105 mg/dl       Recent Imaging:  Results for orders placed or performed during the hospital encounter of 04/03/21 (from the past 72 hour(s))   XR CHEST AP MOBILE     Status: None    Narrative    [name]  Male, 76 years old.    XR AP MOBILE CHEST performed on 04/03/2021 3:21 PM.    REASON FOR EXAM:  intubated    TECHNIQUE: 1 views/1 images submitted for interpretation.    COMPARISON:  Chest radiograph 04/02/2021.    FINDINGS:  The lungs are mildly hypoinflated. The cardiac silhouette is stable in size. There is central vascular congestion with slight interstitial prominence likely accentuated secondary to hypoinflated lungs. There is a small amount of layering pleural fluid on the left with associated consolidation/atelectasis slightly more pronounced on today's study when compared to prior. No right-sided pleural effusion is seen. No visible pneumothorax is identified. Surgical staples project along the upper midline abdomen.    SUPPORT DEVICES:  Endotracheal tube tip projects approximately 4.6 cm above the level of the carina.  Esophageal probe tip terminates within the lower thoracic esophagus.  Enteric tube courses below the level of the GE junction with tip not fully included within this study.      Impression    1.Central vascular congestion and subtle interstitial prominence with a small amount of layering pleural fluid on the left with associated atelectasis. Findings are nonspecific but could be seen with a developing pulmonary edema pattern.  2.Support devices as described above.   XR ABD X-RAY CHECK DOBHOFF PLACEMENT     Status: None (Preliminary result)    Narrative    XR ABD X-RAY CHECK DOBHOFF PLACEMENT performed on 04/03/2021 3:32 PM    INDICATION: 75 years old Male  OG placement    TECHNIQUE: 1 views of the abdomen; 1 images    COMPARISON: Chest radiograph 04/03/2021.    FINDINGS:    Enteric tube is present  with sidehole projecting over the gastric lumen. Tip projects over the left renal cortex and does not follow the expected course toward the duodenum.        Impression    Enteric tube tip beyond the expected location of the gastric lumen on comparison to CT chest abdomen pelvis 03/31/2021. Given recent surgical history, evaluation with CT abdomen is suggested to confirm positioning.         Assessment/ Plan:  Active Hospital Problems    Diagnosis   . Primary Problem: Intra abdominal hemorrhage       [name] is a 76 y.o. male who is  S/P ex lap with extravasation at surgical bed.      NEURO:  GCS: E4=Spontaneous (Opens Eyes on Own) M6=Normal (Follows Simple Commands) V5=Normal Conversation  Sz prophylaxis:  none  Neurochecks per protocol  SBP < 160  Sedation/analgesia: tylenol. Dilaudid, oxycodone    CARDIOVASCULAR:  Systolic (97XYI), AXK:553 , Min:67 , ZSM:270     Diastolic (78MLJ), QGB:20, Min:53, Max:124     ART-Line  MAP: 78 mmHg   Troponins: No results found for: CKMB   Meds: Lipitor  Pressors: none  PULMONARY:  Airway Ventilator Settings     Not on Ventilator   SpO2  Avg: 93.4 %  Min: 90 %  Max: 99 %  Blood Gas:  Recent Labs     04/03/21  1433 04/03/21  1839 04/03/21  2212 04/04/21  0210   FI02 45 36 36 36   PH 7.33* 7.42 7.42 7.41   PCO2 33* 36 37 41   PO2 73 66* 75 77   BICARBONATE 19.0 24.3 24.7 25.8   BASEEXCESS  --   --   --  1.2*   BASEDEFICIT 7.5* 0.7 0.2  --      Nebs: none  Plan: wean O2 to room air as tolerated    GI:  MNT PROTOCOL FOR DIETITIAN  DIET CLEAR LIQUID Consistency/restriction: NO REDS  ROOM SERVICE:  NEEDS VISIT FOR MENU CHOICES   Recent Labs     04/03/21  1151 04/04/21  0210   ALBUMIN 2.5* 2.0*     Last BM: Date of Last Bowel Movement:  (PTA)  Proph: Colace, Senokot, Pepcid    Plan: suspected ileus NPO with ice chips and sips of water    RENAL/GU:  Recent Labs     04/02/21  0355 04/03/21  1151 04/03/21  1421 04/03/21  1433 04/03/21  1839 04/03/21  2212 04/04/21  0210   SODIUM  136 143 143  --   --   --  143   POTASSIUM 3.9 4.1 4.1  --   --   --  3.3*   CHLORIDE 104 108 110  --   --   --  114*   BICARBONATE  --   --   --    < > 24.3 24.7 25.8   BUN _0 --   --   --  16   CREATININE 0.88 1.41* 1.39*  --   --   --  1.38*   ANIONGAP 8 20* 19*  --   --   --  5   CALCIUM 8.6* 7.6* 8.0*  --   --   --  6.9*   MAGNESIUM 2.2  --   --   --   --   --  1.5*   PHOSPHORUS 2.2* 8.0*  --   --   --   --  4.1*    < > = values in this interval not displayed.       I/O last 24 hours:      Intake/Output Summary (Last 24 hours) at 04/04/2021 0604  Last data filed at 04/04/2021 0600  Gross per 24 hour   Intake 2381.94 ml   Output 1100 ml   Net 1281.94 ml     I/O current shift:  03/04 1900 - 03/05 0659  In: 979.67 [P.O.:600; I.V.:379.67]  Out: 640 [Urine:410; Drains:230]  Foley in critically ill pt for strict I/O's  IV fluids: 75 cc plasmalyte  Diuretics: none    --hypomag - replenish this morning with 4 g   -- monitor BMP, mg, p    HEME:  Recent Labs     04/02/21  0355 04/03/21  1151 04/03/21  1421 04/03/21  1839 04/03/21  2036 04/03/21  2212 04/04/21  0210   HGB 9.3* 12.5* 12.9*   < > 11.4* 11.4* 10.8*  10.8*   HCT 29.6* 38.2* 36.6*   < > 31.3* 31.6* 30.4*  30.4*   PLTCNT 465* 193 155  --   --   --  208  APTT 53.9*  --  32.3  --   --   --   --    INR  --   --  1.31*  --   --   --   --     < > = values in this interval not displayed.     Transfusions: none, Hgb stable / hemodynamically stable  Proph: SCD's, holding pharmacologic ppx     ID:  Temp (24hrs) Max:36.9 C (98.4 F)    Recent Labs     04/03/21  1151 04/03/21  1421 04/04/21  0210   WBC 25.1* 21.9* 17.4*   PMNS 77 96 94   BANDS 7  --   --      Cultures: no growth  ABX: none currently    CD4 and CD8 levels ordered overnight  Genvoya    ENDO:  No results for input(s): GLUCOSEPOC in the last 24 hours.  SSI    MSK:  No skin breakdown    OTHER:  Lines: R femoral central line, 22 ga left MC, 22 ga left wrist, 20 ga right hand, left radial art line,  foley  Activity: Bedrest, HOB 30deg  PT/OT:  ordered  MNT:  ordered    PLAN:  --continue to titrate O2 requirement  - started home gabapentin 300 mg TID  -RAP consult for assistance  -- monitor daily bmp, mg, p   --remove central line and arterial line and foley   -- Follow up with surg onc  --suspected ileus, NPO with ice chips and sips and sips with meds  -- hypomagnesemia - replenish with 4 g  - start plasmalyte at 75 cc/hr     Ranae Palms, DO 04/04/2021, 06:04    ______________________________________________________________________________  SICU Attending    I personally saw and examined the patient.  I reviewed the mid-level/resident's note.  I agree with the findings and plan of care as documented in the their note.  Any exceptions/additions are edited/noted, my findings/participation:    Multimodal pain control- utilizing narcotics and non-narcotic adjuncts. RAP consult  Acute blood loss anemia- trending Hgb  N/v- continue Zofran, may need NG, will monitor   Acidosis has corrected  AKI - suspect due to hypotension yesterday. Add plasmalyte mIVF  PE and DVT, need for anticoagulation- D/w primary, resume subQ heparin at ppx dosing today  Remove outside hospital lines    [place]    I was present at the bedside of this critically ill patient for 35 minutes exclusive of procedures.  This patient suffers from failure or dysfunction of GI/Hepatopancreaticobiliary and Hematologic system(s).  The care of this patient was in regard to managing (a) conditions(s) that has a high probability of sudden, clinically significant, or life-threatening deterioration and required a high degree of Attending Physician attention and direct involvement to intervene urgently. Data review and care planning was performed in direct proximity of the patient, examination was obviously performed in direct contact with the patient. All of this time was exclusive of procedure which will be documented elsewhere in the  chart.    My critical care time is independent and unique to other providers (no other providers saw patient for purposes of sicu evaluation)  My critical care time involved full attention to the patients' condition and included:    Review of nursing notes and/or old charts  Review of medications, allergies, and vital signs  Documentation time  Consultant collaboration on findings and treatment options  Care, transfer of care, and discharge plans  Ordering, interpreting, and reviewing diagnostic studies/tab tests  Obtaining necessary history from family, EMS, nursing staff and/or treating physicians    My critical care time did not include time spent teaching resident physician(s) or other services of resident physicians, or performing other reported procedures.  Total Critical Care Time: 35 minutes    Ann Held, MD

## 2021-04-04 NOTE — Progress Notes (Signed)
[facility]  Surgical Oncology  Progress Note      [name], Rami, 76 y.o. male  Date of Birth:  07/20/1945  Date of Admission:  04/03/2021  Date of service: 04/04/2021    Assessment:  This is a 76 y.o. 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 He underwent massive transfusion protocol and ex lap on 04/03/21 with ligation of a bleeding vessel in the liver bed.    Plan/Recommendations:  -Okay with sips and chips  -Start heparin DVT ppx   -Can be transferred to stepdown this afternoon if requiring bed  -Aggressive pulmonary toilet    Subjective: Pt reports some RUQ pain. Pt reports some throat irritation from the intubation. Reports some nausea.     Objective  Filed Vitals:    04/04/21 0715 04/04/21 0730 04/04/21 0745 04/04/21 0800   BP: 107/74 91/68 125/77 (!) 140/73   Pulse: (!) 107 (!) 110 (!) 108 (!) 113   Resp: 17 (!) 28 (!) 21 (!) 25   Temp:       SpO2: 91% 91% 91% 90%     Physical Exam:   GEN:   NAD  PULM:   Lung sounds clear to auscultation bilaterally.  Normal respiratory effort.   CV:   Regular rate and rhythm  ABD:   Abdomen soft, nondistended. Mildly TTP in the RUQ.   MS: Atraumatic.  Distal pulses intact.  Normal strength and range of motion of all extremities.    NEURO:   Alert and oriented to person, place and time.  Cranial nerves grossly intact.    Vascular:  All pulses palpable and equal bilaterally  Integumentary:  Pink, warm, and dry  PSYCHOSOCIAL: Pleasant.  Normal affect.     Labs  Results for orders placed or performed during the hospital encounter of 04/03/21 (from the past 24 hour(s))   ARTERIAL BLOOD GAS WITH LACTATE REFLEX - PLEASE OBTAIN 30 MINUTES AFTER ARRIVAL TO UNIT   Result Value Ref Range    %FIO2 (ARTERIAL) 45 %    PH (ARTERIAL) 7.33 (L) 7.35 - 7.45    PCO2 (ARTERIAL) 33 (L) 35 - 45 mm/Hg     PO2 (ARTERIAL) 73 72 - 100 mm/Hg    BASE DEFICIT 7.5 (H) 0.0 - 3.0 mmol/L    BICARBONATE (ARTERIAL) 19.0 18.0 - 26.0 mmol/L    LACTATE 7.1 (H) 0.0 - 1.3 mmol/L    PAO2/FIO2 RATIO 162 <=132   BASIC METABOLIC PANEL   Result Value Ref Range    SODIUM 143 136 - 145 mmol/L    POTASSIUM 4.1 3.5 - 5.1 mmol/L    CHLORIDE 110 96 - 111 mmol/L    CO2 TOTAL 14 (L) 23 - 31 mmol/L    ANION GAP 19 (H) 4 - 13 mmol/L    CALCIUM 8.0 (L) 8.8 - 10.2 mg/dL    GLUCOSE 281 (H) 65 - 125 mg/dL    BUN 17 8 - 25 mg/dL    CREATININE 1.39 (H) 0.75 - 1.35 mg/dL    BUN/CREA RATIO 12 6 - 22    ESTIMATED GFR 53 (L) >=60 mL/min/BSA   TROPONIN-I   Result Value Ref Range    TROPONIN I 21 0 - 30 ng/L   TROPONIN-I   Result Value Ref Range    TROPONIN I 15 0 -  30 ng/L   TEG, RAPID GLOBAL WITH LYSIS (TRAUMA)   Result Value Ref Range    R (CK) 7.7 4.6 - 9.1 min    LYS30 (%) 0.0 0.0 - 2.6 %    MA (CRT RAPID) 65.4 52.0 - 70.0 mm    MA FIBRINOGEN (CFF) 31.9 15.0 - 32.0 mm   PT/INR   Result Value Ref Range    PROTHROMBIN TIME 15.2 (H) 9.1 - 13.9 seconds    INR 1.31 (H) 0.80 - 1.20    Narrative    Coumadin therapy INR range for Conventional Anticoagulation is 2.0 to 3.0 and for Intensive Anticoagulation 2.5 to 3.5.   PTT (PARTIAL THROMBOPLASTIN TIME)   Result Value Ref Range    APTT 32.3 24.2 - 37.5 seconds    Narrative    Therapeutic range for unfractionated heparin is 60-100 seconds.   ALT (SGPT)   Result Value Ref Range    ALT (SGPT) 335 (H) 10 - 55 U/L   AST (SGOT)   Result Value Ref Range    AST (SGOT)  382 (H) 8 - 45 U/L   BILIRUBIN TOTAL   Result Value Ref Range    BILIRUBIN TOTAL 0.9 0.3 - 1.3 mg/dL   ALK PHOS (ALKALINE PHOSPHATASE)   Result Value Ref Range    ALKALINE PHOSPHATASE 70 45 - 115 U/L   GAMMA GT   Result Value Ref Range    GGT 57 (H) 7 - 50 U/L   PROTEIN TOTAL   Result Value Ref Range    PROTEIN TOTAL 4.8 (L) 6.0 - 8.0 g/dL   CBC/DIFF - ONCE    Narrative    The following orders were created for panel order CBC/DIFF - ONCE.  Procedure                                Abnormality         Status                     ---------                               -----------         ------                     CBC WITH DIFF[[id number]]                Abnormal            Final result               MANUAL DIFF AND MORPHOLO.Marland KitchenMarland Kitchen[[id number]]  Abnormal            Final result                 Please view results for these tests on the individual orders.   CBC WITH DIFF   Result Value Ref Range    WBC 21.9 (H) 3.7 - 11.0 x103/uL    RBC 4.28 (L) 4.50 - 6.10 x106/uL    HGB 12.9 (L) 13.4 - 17.5 g/dL    HCT 36.6 (L) 38.9 - 52.0 %    MCV 85.5 78.0 - 100.0 fL    MCH 30.1 26.0 - 32.0 pg    MCHC 35.2 31.0 - 35.5 g/dL    RDW-CV 14.6 11.5 - 15.5 %  PLATELETS 155 150 - 400 x103/uL    MPV 11.3 8.7 - 12.5 fL    Narrative    Platelet Count -Platelet count seems to correlate with slide.  Platelets unevenly distributed.   ARTERIAL BLOOD GAS WITH LACTATE REFLEX   Result Value Ref Range    %FIO2 (ARTERIAL) 36 %    PH (ARTERIAL) 7.42 7.35 - 7.45    PCO2 (ARTERIAL) 36 35 - 45 mm/Hg    PO2 (ARTERIAL) 66 (L) 72 - 100 mm/Hg    BASE DEFICIT 0.7 0.0 - 3.0 mmol/L    BICARBONATE (ARTERIAL) 24.3 18.0 - 26.0 mmol/L    LACTATE 3.1 (H) 0.0 - 1.3 mmol/L    PAO2/FIO2 RATIO 183 <=200   ARTERIAL BLOOD GAS WITH LACTATE REFLEX   Result Value Ref Range    %FIO2 (ARTERIAL) 36 %    PH (ARTERIAL) 7.42 7.35 - 7.45    PCO2 (ARTERIAL) 37 35 - 45 mm/Hg    PO2 (ARTERIAL) 75 72 - 100 mm/Hg    BASE DEFICIT 0.2 0.0 - 3.0 mmol/L    BICARBONATE (ARTERIAL) 24.7 18.0 - 26.0 mmol/L    LACTATE 1.3 0.0 - 1.3 mmol/L    PAO2/FIO2 RATIO 208 <=200   H & H   Result Value Ref Range    HGB 11.0 (L) 13.4 - 17.5 g/dL    HCT 30.4 (L) 38.9 - 52.0 %   H & H   Result Value Ref Range    HGB 11.4 (L) 13.4 - 17.5 g/dL    HCT 31.6 (L) 38.9 - 52.0 %   LACTIC ACID TIMED   Result Value Ref Range    LACTIC ACID 4.0 (HH) 0.5 - 2.2 mmol/L   MANUAL DIFF AND MORPHOLOGY-SYSMEX   Result Value Ref Range    NEUTROPHIL % 96 %    LYMPHOCYTE %  2 %     MONOCYTE % 2 %    EOSINOPHIL % 0 %    BASOPHIL % 0 %    METAMYELOCYTE %  1 %    NEUTROPHIL # 21.02 (H) 1.50 - 7.70 x103/uL    LYMPHOCYTE # 0.44 (L) 1.00 - 4.80 x103/uL    MONOCYTE # 0.44 0.20 - 1.10 x103/uL    EOSINOPHIL # <0.10 <=0.50 x103/uL    BASOPHIL # <0.10 <=0.20 x103/uL    ECHINOCYTE (BURR CELL) 2+/Moderate (A) None    TOXIC GRANULATION Present (A) None   H & H   Result Value Ref Range    HGB 11.4 (L) 13.4 - 17.5 g/dL    HCT 31.3 (L) 38.9 - 52.0 %   CBC/DIFF - 0530AM DRAW TOMORROW    Narrative    The following orders were created for panel order CBC/DIFF - 0530AM DRAW TOMORROW.  Procedure                               Abnormality         Status                     ---------                               -----------         ------                     CBC WITH ENMM[[id number]]  Abnormal            Final result               MANUAL DIFF AND MORPHOLO.Marland KitchenMarland Kitchen[[id number]]  Abnormal            Final result                 Please view results for these tests on the individual orders.   TROPONIN-I   Result Value Ref Range    TROPONIN I 30 0 - 30 ng/L   ARTERIAL BLOOD GAS WITH LACTATE REFLEX   Result Value Ref Range    %FIO2 (ARTERIAL) 36 %    PH (ARTERIAL) 7.41 7.35 - 7.45    PCO2 (ARTERIAL) 41 35 - 45 mm/Hg    PO2 (ARTERIAL) 77 72 - 100 mm/Hg    BASE EXCESS (ARTERIAL) 1.2 (H) 0.0 - 1.0 mmol/L    BICARBONATE (ARTERIAL) 25.8 18.0 - 26.0 mmol/L    LACTATE 0.8 0.0 - 1.3 mmol/L    PAO2/FIO2 RATIO 214 <=200   H & H   Result Value Ref Range    HGB 10.8 (L) 13.4 - 17.5 g/dL    HCT 30.4 (L) 38.9 - 52.0 %   BASIC METABOLIC PANEL   Result Value Ref Range    SODIUM 143 136 - 145 mmol/L    POTASSIUM 3.3 (L) 3.5 - 5.1 mmol/L    CHLORIDE 114 (H) 96 - 111 mmol/L    CO2 TOTAL 24 23 - 31 mmol/L    ANION GAP 5 4 - 13 mmol/L    CALCIUM 6.9 (L) 8.8 - 10.2 mg/dL    GLUCOSE 68 65 - 125 mg/dL    BUN 16 8 - 25 mg/dL    CREATININE 1.38 (H) 0.75 - 1.35 mg/dL    BUN/CREA RATIO 12 6 - 22    ESTIMATED GFR 53 (L) >=60 mL/min/BSA    PHOSPHORUS   Result Value Ref Range    PHOSPHORUS 4.1 (H) 2.3 - 4.0 mg/dL   MAGNESIUM   Result Value Ref Range    MAGNESIUM 1.5 (L) 1.8 - 2.6 mg/dL   HEPATIC FUNCTION PANEL   Result Value Ref Range    ALBUMIN 2.0 (L) 3.4 - 4.8 g/dL     ALKALINE PHOSPHATASE 59 45 - 115 U/L    ALT (SGPT) 215 (H) 10 - 55 U/L    AST (SGOT)  251 (H) 8 - 45 U/L    BILIRUBIN TOTAL 0.3 0.3 - 1.3 mg/dL    BILIRUBIN DIRECT 0.2 0.1 - 0.4 mg/dL    PROTEIN TOTAL 3.8 (L) 6.0 - 8.0 g/dL   CBC WITH DIFF   Result Value Ref Range    WBC 17.4 (H) 3.7 - 11.0 x103/uL    RBC 3.69 (L) 4.50 - 6.10 x106/uL    HGB 10.8 (L) 13.4 - 17.5 g/dL    HCT 30.4 (L) 38.9 - 52.0 %    MCV 82.4 78.0 - 100.0 fL    MCH 29.3 26.0 - 32.0 pg    MCHC 35.5 31.0 - 35.5 g/dL    RDW-CV 15.7 (H) 11.5 - 15.5 %    PLATELETS 208 150 - 400 x103/uL    MPV 10.6 8.7 - 12.5 fL   MANUAL DIFF AND MORPHOLOGY-SYSMEX   Result Value Ref Range    NEUTROPHIL % 94 %    LYMPHOCYTE %  4 %    MONOCYTE % 3 %    EOSINOPHIL % 0 %  BASOPHIL % 0 %    NEUTROPHIL # 16.36 (H) 1.50 - 7.70 x103/uL    LYMPHOCYTE # 0.70 (L) 1.00 - 4.80 x103/uL    MONOCYTE # 0.52 0.20 - 1.10 x103/uL    EOSINOPHIL # <0.10 <=0.50 x103/uL    BASOPHIL # <0.10 <=0.20 x103/uL    NRBC FROM MANUAL DIFF 6 per 100 WBC    RBC MORPHOLOGY Normal RBC and PLT Morphology    TROPONIN-I   Result Value Ref Range    TROPONIN I 18 0 - 30 ng/L   TYPE AND CROSS RED CELLS - UNITS , 3 Units   Result Value Ref Range    UNITS ORDERED 3     SPECIMEN EXPIRATION DATE 04/06/2021,2359     ABO/RH(D) A NEGATIVE     ANTIBODY SCREEN NEGATIVE    BPAM PACKED CELL ORDER   Result Value Ref Range    Coding System [id number]     UNIT NUMBER [id number]     BLOOD COMPONENT TYPE LR RBC, Adsol3, 04761     UNIT DIVISION 00     UNIT DISPENSE STATUS ALLOCATED     TRANSFUSION STATUS OK TO TRANSFUSE     IS CROSSMATCH Electronically Compatible     Product Code M5784O96     Coding System ISBT128     UNIT NUMBER [id number]     BLOOD COMPONENT TYPE LR RBC, Adsol1, 04710      UNIT DIVISION 00     UNIT DISPENSE STATUS ALLOCATED     TRANSFUSION STATUS OK TO TRANSFUSE     IS CROSSMATCH Electronically Compatible     Product Code U2725D66     Coding System ISBT128     UNIT NUMBER [id number]     BLOOD COMPONENT TYPE LR RBC, Adsol1, 04710     UNIT DIVISION 00     UNIT DISPENSE STATUS ALLOCATED     TRANSFUSION STATUS OK TO TRANSFUSE     IS CROSSMATCH Electronically Compatible     Product Code O7564P32    POC BLOOD GLUCOSE (RESULTS)   Result Value Ref Range    GLUCOSE, POC 166 (H) 70 - 105 mg/dl   POC BLOOD GLUCOSE (RESULTS)   Result Value Ref Range    GLUCOSE, POC 71 70 - 105 mg/dl   Results for orders placed or performed during the hospital encounter of 04/03/21 (from the past 24 hour(s))   CBC/DIFF *Canceled*    Narrative    The following orders were created for panel order CBC/DIFF.  Procedure                               Abnormality         Status                     ---------                               -----------         ------                       Please view results for these tests on the individual orders.   TRIGLYCERIDE ROUTINE TODAY - BASELINE   Result Value Ref Range    TRIGLYCERIDES 141 <150 mg/dL   TROPONIN-I   Result Value Ref Range    TROPONIN I 22 <=30  ng/L   LACTIC ACID LEVEL W/ REFLEX FOR LEVEL >2.0   Result Value Ref Range    LACTIC ACID 13.0 (HH) 0.5 - 2.2 mmol/L   PHOSPHORUS   Result Value Ref Range    PHOSPHORUS 8.0 (H) 2.3 - 4.0 mg/dL    Narrative    Hemolysis can alter results at this level (slight).   COMPREHENSIVE METABOLIC PANEL, NON-FASTING   Result Value Ref Range    SODIUM 143 136 - 145 mmol/L    POTASSIUM 4.1 3.5 - 5.1 mmol/L    CHLORIDE 108 96 - 111 mmol/L    CO2 TOTAL 15 (L) 23 - 31 mmol/L    ANION GAP 20 (H) 4 - 13 mmol/L    BUN 15 8 - 25 mg/dL    CREATININE 1.41 (H) 0.75 - 1.35 mg/dL    BUN/CREA RATIO 11 6 - 22    ESTIMATED GFR 52 (L) >=60 mL/min/BSA    ALBUMIN 2.5 (L) 3.4 - 4.8 g/dL     CALCIUM 7.6 (L) 8.8 - 10.2 mg/dL    GLUCOSE 208 (H) 65 - 125  mg/dL    ALKALINE PHOSPHATASE 70 45 - 115 U/L    ALT (SGPT) 362 (H) 10 - 55 U/L    AST (SGOT)  388 (H) 8 - 45 U/L    BILIRUBIN TOTAL 0.8 0.3 - 1.3 mg/dL    PROTEIN TOTAL 4.4 (L) 6.0 - 8.0 g/dL    Narrative    Hemolysis can alter results at this level (slight).  Hemolysis can alter results at this level (slight).   CBC/DIFF    Narrative    The following orders were created for panel order CBC/DIFF.  Procedure                               Abnormality         Status                     ---------                               -----------         ------                     CBC WITH RAQT[[id number]]                Abnormal            Final result               MANUAL DIFF AND MORPHOLO.Marland KitchenMarland Kitchen[[id number]]  Abnormal            Final result                 Please view results for these tests on the individual orders.   CBC WITH DIFF   Result Value Ref Range    WBC 25.1 (H) 3.7 - 11.0 x103/uL    RBC 4.23 (L) 4.50 - 6.10 x106/uL    HGB 12.5 (L) 13.4 - 17.5 g/dL    HCT 38.2 (L) 38.9 - 52.0 %    MCV 90.3 78.0 - 100.0 fL    MCH 29.6 26.0 - 32.0 pg    MCHC 32.7 31.0 - 35.5 g/dL    RDW-CV 14.6 11.5 - 15.5 %    PLATELETS 193 150 - 400 x103/uL  MPV 10.2 8.7 - 12.5 fL   MANUAL DIFF AND MORPHOLOGY-SYSMEX   Result Value Ref Range    NEUTROPHIL % 77 %    LYMPHOCYTE %  4 %    MONOCYTE % 10 %    EOSINOPHIL % 0 %    BASOPHIL % 0 %    NEUTROPHIL BANDS % 7 %    METAMYELOCYTE %  2 %    NEUTROPHIL # 21.08 (H) 1.50 - 7.70 x103/uL    LYMPHOCYTE # 1.00 1.00 - 4.80 x103/uL    MONOCYTE # 2.51 (H) 0.20 - 1.10 x103/uL    EOSINOPHIL # <0.10 <=0.50 x103/uL    BASOPHIL # <0.10 <=0.20 x103/uL    ECHINOCYTE (BURR CELL) 2+/Moderate (A) None   POC BLOOD GLUCOSE (RESULTS)   Result Value Ref Range    GLUCOSE, POC 216 Fasting: 80-130 mg/dL; 2 HR PC: <180 mg/dL mg/dl       I/O:  Date 04/03/21 0700 - 04/04/21 0659 04/04/21 0700 - 04/05/21 0659   Shift 0700-1459 1500-2259 2300-0659 24 Hour Total 0700-1459 1500-2259 2300-0659 24 Hour Total   INTAKE   P.O.  200  400 600         Oral  200 400 600       I.V. 0.4 1757.54(2.67) 24(0.04) 1781.94(0.87) 6   6     Med (IV) Flush Volume  20  20         AL/PA/LA/RA/CVP/IABP/CO  9 24 33 6   6     Norepinephrine Volume 0.33 1.45  1.78         Fentanyl Volume 0.07 0.42  0.49         Volume Infused (electrolyte-A (PLASMALYTE-A) premix infusion)  726.67  726.67         Volume Infused (electrolyte-A (PLASMALYTE-A) bolus infusion 1,000 mL)  1000  1000       Shift Total(mL/kg) 0.4 1957.54(23.79) 424(4.97) 2381.94(27.92) 6(0.07)   6(0.07)   OUTPUT   Urine 150 290(0.44) 270(0.4) 710(0.35) 30   30     Output (Foley Catheter) 150 290 270 710 30   30   Drains 30 300 90 420         JP Drain Output Terrial Rhodes Drain Right;Upper Abdomen) 30 300 90 420       Shift Total(mL/kg) 180 590(7.17) 360(4.22) 1595(39.67) 30(0.35)   30(0.35)   Weight (kg)  82.3 85.3 85.3 85.3 85.3 85.3 85.3       Radiology  Results for orders placed or performed during the hospital encounter of 04/03/21 (from the past 72 hour(s))   XR CHEST AP MOBILE     Status: None    Narrative    DRAYCE TAWIL Lehtinen  Male, 76 years old.    XR AP MOBILE CHEST performed on 04/03/2021 3:21 PM.    REASON FOR EXAM:  intubated    TECHNIQUE: 1 views/1 images submitted for interpretation.    COMPARISON:  Chest radiograph 04/02/2021.    FINDINGS:  The lungs are mildly hypoinflated. The cardiac silhouette is stable in size. There is central vascular congestion with slight interstitial prominence likely accentuated secondary to hypoinflated lungs. There is a small amount of layering pleural fluid on the left with associated consolidation/atelectasis slightly more pronounced on today's study when compared to prior. No right-sided pleural effusion is seen. No visible pneumothorax is identified. Surgical staples project along the upper midline abdomen.    SUPPORT DEVICES:  Endotracheal tube tip projects approximately 4.6 cm above the level  of the carina.  Esophageal probe tip terminates within the lower  thoracic esophagus.  Enteric tube courses below the level of the GE junction with tip not fully included within this study.      Impression    1.Central vascular congestion and subtle interstitial prominence with a small amount of layering pleural fluid on the left with associated atelectasis. Findings are nonspecific but could be seen with a developing pulmonary edema pattern.  2.Support devices as described above.   XR ABD X-RAY CHECK DOBHOFF PLACEMENT     Status: None (Preliminary result)    Narrative    XR ABD X-RAY CHECK DOBHOFF PLACEMENT performed on 04/03/2021 3:32 PM    INDICATION: 76 years old Male  OG placement    TECHNIQUE: 1 views of the abdomen; 1 images    COMPARISON: Chest radiograph 04/03/2021.    FINDINGS:    Enteric tube is present with sidehole projecting over the gastric lumen. Tip projects over the left renal cortex and does not follow the expected course toward the duodenum.        Impression    Enteric tube tip beyond the expected location of the gastric lumen on comparison to CT chest abdomen pelvis 03/31/2021. Given recent surgical history, evaluation with CT abdomen is suggested to confirm positioning.         Current Medications:  Current Facility-Administered Medications   Medication Dose Route Frequency    acetaminophen (TYLENOL) tablet  650 mg Oral Q4H    atorvastatin (LIPITOR) tablet  40 mg Oral Daily with Breakfast    docusate sodium (COLACE) 78m per mL oral liquid  100 mg Oral 2x/day    electrolyte-A (PLASMALYTE-A) premix infusion   Intravenous Continuous    elvitegravir-cobicistat-emtricitrabine-tenofovir (GENVOYA) 150-150-200-10 mg per tablet  1 Tablet Oral Daily    gabapentin (NEURONTIN) capsule  300 mg Oral 3x/day    heparin 5,000 unit/mL injection  5,000 Units Subcutaneous Q8HRS    HYDROmorphone (DILAUDID) 1 mg/mL injection  0.2 mg Intravenous Q3H PRN    levothyroxine (SYNTHROID) tablet  88 mcg Oral QAM    magnesium sulfate 4 G in SW 100 mL premix IVPB  4 g Intravenous Once     ondansetron (ZOFRAN) 2 mg/mL injection  4 mg Intravenous Q6H PRN    oxyCODONE (ROXICODONE) immediate release tablet  5 mg Oral Q4H PRN    oxyCODONE (ROXICODONE) immediate releaste tablet  10 mg Oral Q4H PRN    pantoprazole (PROTONIX) delayed release tablet  40 mg Oral Daily before Breakfast    senna concentrate (SENNA) 5266mper 1577mral liquid  10 mL Oral 2x/day    SSIP insulin R human (HUMULIN R) 100 units/mL injection  0-12 Units Subcutaneous Q6H PRN         JonCasimer LeekD 04/04/2021, 10:26      I saw and examined the patient.  I reviewed the resident's note.  I agree with the findings and plan of care as documented in the resident's note.  Any exceptions/additions are edited/noted.    Extubated. Off pressors.  Labs improved.     BriAaron Edelman BooCyndi BenderD, FACS

## 2021-04-04 NOTE — Progress Notes (Deleted)
[facility]                                               SICU PROGRESS NOTE    [name], [name]  Date of Admission:  04/03/2021  Date of Service: 04/04/2021  Date of Birth:  1945-05-08    Primary Attending:  Dr.Boone  Primary Service:  Surg Onc    Post Op Day:  S/P      LOS: 1 day      Subjective:    76 yo m PMH prostate ca, HLD, HTN, HIV, hypothyroidism transfer from [facility] with active contrast extravasation at the surgical bed from recent ex lap , distal pancreatectomy, and splenectomy for IPMN lesion.     Extubated yesterday evening. Currently on NC at 4 lpm.Sitting upright in bed this morning. Some moderate abdominal pain and nausea.       Vital Signs:  Temp (24hrs) Max:36.9 C (09.3 F)      Systolic (81WEX), HBZ:169 , Min:67 , CVE:938     Diastolic (10FBP), ZWC:58, Min:53, Max:124    Temp  Avg: 35.4 C (95.8 F)  Min: 34.5 C (94.1 F)  Max: 36.9 C (98.4 F)  MAP (Non-Invasive)  Avg: 88.6 mmHG  Min: 62 mmHG  Max: 135 mmHG  Pulse  Avg: 115.1  Min: 105  Max: 128  Resp  Avg: 23.6  Min: 14  Max: 43  SpO2  Avg: 93.4 %  Min: 90 %  Max: 99 %       Meds  No current outpatient medications on file.     acetaminophen (TYLENOL) tablet, 650 mg, Oral, Q4H  atorvastatin (LIPITOR) tablet, 40 mg, Oral, Daily with Breakfast  docusate sodium (COLACE) 5m per mL oral liquid, 100 mg, Oral, 2x/day  elvitegravir-cobicistat-emtricitrabine-tenofovir (GENVOYA) 150-150-200-10 mg per tablet, 1 Tablet, Oral, Daily  HYDROmorphone (DILAUDID) 1 mg/mL injection, 0.2 mg, Intravenous, Q3H PRN  levothyroxine (SYNTHROID) tablet, 88 mcg, Oral, QAM  norepinephrine (LEVOPHED) 16 mg in NS 2558mpremix infusion, 0.03 mcg/kg/min, Intravenous, Continuous  ondansetron (ZOFRAN) 2 mg/mL injection, 4 mg, Intravenous, Q8H PRN  oxyCODONE (ROXICODONE) immediate release tablet, 5 mg, Oral, Q4H PRN  oxyCODONE (ROXICODONE) immediate releaste tablet, 10 mg, Oral, Q4H PRN  pantoprazole (PROTONIX) delayed release tablet, 40 mg, Oral, Daily  before Breakfast  senna concentrate (SENNA) 52819mer 5m49mal liquid, 10 mL, Oral, 2x/day  SSIP insulin R human (HUMULIN R) 100 units/mL injection, 0-12 Units, Subcutaneous, Q6H PRN        Physical Exam:   General:  NAD. Sitting upright in bed  Eyes:  Conjunctiva clear  HENT:  NCAT, Mucous membranes moist  Lungs:  CTAB, Normal respiratory effort on 4 lpm NC  Cardiovascular:  RR  Abdomen:  S, tender, some distention .  Extremities:  No cyanosis or edema.  Skin:  Skin warm and dry.  Neurologic:  Alert and oriented x3, grossly normal  Psychiatric:  Affect normal.    Labs:  Lab Results Today:    Results for orders placed or performed during the hospital encounter of 04/03/21 (from the past 24 hour(s))   BASIC METABOLIC PANEL   Result Value Ref Range    SODIUM 143 136 - 145 mmol/L    POTASSIUM 4.1 3.5 - 5.1 mmol/L    CHLORIDE 110 96 - 111 mmol/L    CO2 TOTAL 14 (L)  23 - 31 mmol/L    ANION GAP 19 (H) 4 - 13 mmol/L    CALCIUM 8.0 (L) 8.8 - 10.2 mg/dL    GLUCOSE 281 (H) 65 - 125 mg/dL    BUN 17 8 - 25 mg/dL    CREATININE 1.39 (H) 0.75 - 1.35 mg/dL    BUN/CREA RATIO 12 6 - 22    ESTIMATED GFR 53 (L) >=60 mL/min/BSA   TROPONIN-I   Result Value Ref Range    TROPONIN I 21 0 - 30 ng/L   TEG, RAPID GLOBAL WITH LYSIS (TRAUMA)   Result Value Ref Range    R (CK) 7.7 4.6 - 9.1 min    LYS30 (%) 0.0 0.0 - 2.6 %    MA (CRT RAPID) 65.4 52.0 - 70.0 mm    MA FIBRINOGEN (CFF) 31.9 15.0 - 32.0 mm   PT/INR   Result Value Ref Range    PROTHROMBIN TIME 15.2 (H) 9.1 - 13.9 seconds    INR 1.31 (H) 0.80 - 1.20   PTT (PARTIAL THROMBOPLASTIN TIME)   Result Value Ref Range    APTT 32.3 24.2 - 37.5 seconds   ALT (SGPT)   Result Value Ref Range    ALT (SGPT) 335 (H) 10 - 55 U/L   AST (SGOT)   Result Value Ref Range    AST (SGOT)  382 (H) 8 - 45 U/L   BILIRUBIN TOTAL   Result Value Ref Range    BILIRUBIN TOTAL 0.9 0.3 - 1.3 mg/dL   ALK PHOS (ALKALINE PHOSPHATASE)   Result Value Ref Range    ALKALINE PHOSPHATASE 70 45 - 115 U/L   GAMMA GT   Result  Value Ref Range    GGT 57 (H) 7 - 50 U/L   PROTEIN TOTAL   Result Value Ref Range    PROTEIN TOTAL 4.8 (L) 6.0 - 8.0 g/dL   CBC WITH DIFF   Result Value Ref Range    WBC 21.9 (H) 3.7 - 11.0 x10^3/uL    RBC 4.28 (L) 4.50 - 6.10 x10^6/uL    HGB 12.9 (L) 13.4 - 17.5 g/dL    HCT 36.6 (L) 38.9 - 52.0 %    MCV 85.5 78.0 - 100.0 fL    MCH 30.1 26.0 - 32.0 pg    MCHC 35.2 31.0 - 35.5 g/dL    RDW-CV 14.6 11.5 - 15.5 %    PLATELETS 155 150 - 400 x10^3/uL    MPV 11.3 8.7 - 12.5 fL   MANUAL DIFF AND MORPHOLOGY-SYSMEX   Result Value Ref Range    NEUTROPHIL % 96 %    LYMPHOCYTE %  2 %    MONOCYTE % 2 %    EOSINOPHIL % 0 %    BASOPHIL % 0 %    METAMYELOCYTE %  1 %    NEUTROPHIL # 21.02 (H) 1.50 - 7.70 x10^3/uL    LYMPHOCYTE # 0.44 (L) 1.00 - 4.80 x10^3/uL    MONOCYTE # 0.44 0.20 - 1.10 x10^3/uL    EOSINOPHIL # <0.10 <=0.50 x10^3/uL    BASOPHIL # <0.10 <=0.20 x10^3/uL    ECHINOCYTE (BURR CELL) 2+/Moderate (A) None    TOXIC GRANULATION Present (A) None   TYPE AND CROSS RED CELLS - UNITS , 3 Units   Result Value Ref Range    UNITS ORDERED 3     SPECIMEN EXPIRATION DATE 04/06/2021,2359     ABO/RH(D) A NEGATIVE     ANTIBODY SCREEN NEGATIVE    BPAM  PACKED CELL ORDER   Result Value Ref Range    Coding System [id number]     UNIT NUMBER [id number]     BLOOD COMPONENT TYPE LR RBC, Adsol3, 04761     UNIT DIVISION 00     UNIT DISPENSE STATUS ALLOCATED     TRANSFUSION STATUS OK TO TRANSFUSE     IS CROSSMATCH Electronically Compatible     Product Code [phone]     Coding System [id number]     UNIT NUMBER [id number]     BLOOD COMPONENT TYPE LR RBC, Adsol1, 04710     UNIT DIVISION 00     UNIT DISPENSE STATUS ALLOCATED     TRANSFUSION STATUS OK TO TRANSFUSE     IS CROSSMATCH Electronically Compatible     Product Code [phone]     Coding System [id number]     UNIT NUMBER [id number]     BLOOD COMPONENT TYPE LR RBC, Adsol1, 04710     UNIT DIVISION 00     UNIT DISPENSE STATUS ALLOCATED     TRANSFUSION STATUS OK TO TRANSFUSE     IS CROSSMATCH  Electronically Compatible     Product Code J1791T05    ARTERIAL BLOOD GAS WITH LACTATE REFLEX - PLEASE OBTAIN 30 MINUTES AFTER ARRIVAL TO UNIT   Result Value Ref Range    %FIO2 (ARTERIAL) 45 %    PH (ARTERIAL) 7.33 (L) 7.35 - 7.45    PCO2 (ARTERIAL) 33 (L) 35 - 45 mm/Hg    PO2 (ARTERIAL) 73 72 - 100 mm/Hg    BASE DEFICIT 7.5 (H) 0.0 - 3.0 mmol/L    BICARBONATE (ARTERIAL) 19.0 18.0 - 26.0 mmol/L    LACTATE 7.1 (H) 0.0 - 1.3 mmol/L    PAO2/FIO2 RATIO 162 <=200   LACTIC ACID TIMED   Result Value Ref Range    LACTIC ACID 4.0 (HH) 0.5 - 2.2 mmol/L   ARTERIAL BLOOD GAS WITH LACTATE REFLEX   Result Value Ref Range    %FIO2 (ARTERIAL) 36 %    PH (ARTERIAL) 7.42 7.35 - 7.45    PCO2 (ARTERIAL) 36 35 - 45 mm/Hg    PO2 (ARTERIAL) 66 (L) 72 - 100 mm/Hg    BASE DEFICIT 0.7 0.0 - 3.0 mmol/L    BICARBONATE (ARTERIAL) 24.3 18.0 - 26.0 mmol/L    LACTATE 3.1 (H) 0.0 - 1.3 mmol/L    PAO2/FIO2 RATIO 183 <=200   H & H   Result Value Ref Range    HGB 11.0 (L) 13.4 - 17.5 g/dL    HCT 30.4 (L) 38.9 - 52.0 %   POC BLOOD GLUCOSE (RESULTS)   Result Value Ref Range    GLUCOSE, POC 166 (H) 70 - 105 mg/dl   TROPONIN-I   Result Value Ref Range    TROPONIN I 15 0 - 30 ng/L   H & H   Result Value Ref Range    HGB 11.4 (L) 13.4 - 17.5 g/dL    HCT 31.3 (L) 38.9 - 52.0 %   ARTERIAL BLOOD GAS WITH LACTATE REFLEX   Result Value Ref Range    %FIO2 (ARTERIAL) 36 %    PH (ARTERIAL) 7.42 7.35 - 7.45    PCO2 (ARTERIAL) 37 35 - 45 mm/Hg    PO2 (ARTERIAL) 75 72 - 100 mm/Hg    BASE DEFICIT 0.2 0.0 - 3.0 mmol/L    BICARBONATE (ARTERIAL) 24.7 18.0 - 26.0 mmol/L    LACTATE 1.3 0.0 - 1.3 mmol/L    PAO2/FIO2 RATIO 208 <=  200   H & H   Result Value Ref Range    HGB 11.4 (L) 13.4 - 17.5 g/dL    HCT 31.6 (L) 38.9 - 52.0 %   TROPONIN-I   Result Value Ref Range    TROPONIN I 30 0 - 30 ng/L   ARTERIAL BLOOD GAS WITH LACTATE REFLEX   Result Value Ref Range    %FIO2 (ARTERIAL) 36 %    PH (ARTERIAL) 7.41 7.35 - 7.45    PCO2 (ARTERIAL) 41 35 - 45 mm/Hg    PO2 (ARTERIAL) 77 72 -  100 mm/Hg    BASE EXCESS (ARTERIAL) 1.2 (H) 0.0 - 1.0 mmol/L    BICARBONATE (ARTERIAL) 25.8 18.0 - 26.0 mmol/L    LACTATE 0.8 0.0 - 1.3 mmol/L    PAO2/FIO2 RATIO 214 <=200   H & H   Result Value Ref Range    HGB 10.8 (L) 13.4 - 17.5 g/dL    HCT 30.4 (L) 38.9 - 52.0 %   BASIC METABOLIC PANEL   Result Value Ref Range    SODIUM 143 136 - 145 mmol/L    POTASSIUM 3.3 (L) 3.5 - 5.1 mmol/L    CHLORIDE 114 (H) 96 - 111 mmol/L    CO2 TOTAL 24 23 - 31 mmol/L    ANION GAP 5 4 - 13 mmol/L    CALCIUM 6.9 (L) 8.8 - 10.2 mg/dL    GLUCOSE 68 65 - 125 mg/dL    BUN 16 8 - 25 mg/dL    CREATININE 1.38 (H) 0.75 - 1.35 mg/dL    BUN/CREA RATIO 12 6 - 22    ESTIMATED GFR 53 (L) >=60 mL/min/BSA   PHOSPHORUS   Result Value Ref Range    PHOSPHORUS 4.1 (H) 2.3 - 4.0 mg/dL   MAGNESIUM   Result Value Ref Range    MAGNESIUM 1.5 (L) 1.8 - 2.6 mg/dL   HEPATIC FUNCTION PANEL   Result Value Ref Range    ALBUMIN 2.0 (L) 3.4 - 4.8 g/dL     ALKALINE PHOSPHATASE 59 45 - 115 U/L    ALT (SGPT) 215 (H) 10 - 55 U/L    AST (SGOT)  251 (H) 8 - 45 U/L    BILIRUBIN TOTAL 0.3 0.3 - 1.3 mg/dL    BILIRUBIN DIRECT 0.2 0.1 - 0.4 mg/dL    PROTEIN TOTAL 3.8 (L) 6.0 - 8.0 g/dL   CBC WITH DIFF   Result Value Ref Range    WBC 17.4 (H) 3.7 - 11.0 x10^3/uL    RBC 3.69 (L) 4.50 - 6.10 x10^6/uL    HGB 10.8 (L) 13.4 - 17.5 g/dL    HCT 30.4 (L) 38.9 - 52.0 %    MCV 82.4 78.0 - 100.0 fL    MCH 29.3 26.0 - 32.0 pg    MCHC 35.5 31.0 - 35.5 g/dL    RDW-CV 15.7 (H) 11.5 - 15.5 %    PLATELETS 208 150 - 400 x10^3/uL    MPV 10.6 8.7 - 12.5 fL   MANUAL DIFF AND MORPHOLOGY-SYSMEX   Result Value Ref Range    NEUTROPHIL % 94 %    LYMPHOCYTE %  4 %    MONOCYTE % 3 %    EOSINOPHIL % 0 %    BASOPHIL % 0 %    NEUTROPHIL # 16.36 (H) 1.50 - 7.70 x10^3/uL    LYMPHOCYTE # 0.70 (L) 1.00 - 4.80 x10^3/uL    MONOCYTE # 0.52 0.20 - 1.10 x10^3/uL  EOSINOPHIL # <0.10 <=0.50 x10^3/uL    BASOPHIL # <0.10 <=0.20 x10^3/uL    NRBC FROM MANUAL DIFF 6 per 100 WBC    RBC MORPHOLOGY Normal RBC and PLT  Morphology    POC BLOOD GLUCOSE (RESULTS)   Result Value Ref Range    GLUCOSE, POC 71 70 - 105 mg/dl       Recent Imaging:  Results for orders placed or performed during the hospital encounter of 04/03/21 (from the past 72 hour(s))   XR CHEST AP MOBILE     Status: None    Narrative    [name]  Male, 76 years old.    XR AP MOBILE CHEST performed on 04/03/2021 3:21 PM.    REASON FOR EXAM:  intubated    TECHNIQUE: 1 views/1 images submitted for interpretation.    COMPARISON:  Chest radiograph 04/02/2021.    FINDINGS:  The lungs are mildly hypoinflated. The cardiac silhouette is stable in size. There is central vascular congestion with slight interstitial prominence likely accentuated secondary to hypoinflated lungs. There is a small amount of layering pleural fluid on the left with associated consolidation/atelectasis slightly more pronounced on today's study when compared to prior. No right-sided pleural effusion is seen. No visible pneumothorax is identified. Surgical staples project along the upper midline abdomen.    SUPPORT DEVICES:  Endotracheal tube tip projects approximately 4.6 cm above the level of the carina.  Esophageal probe tip terminates within the lower thoracic esophagus.  Enteric tube courses below the level of the GE junction with tip not fully included within this study.      Impression    1.Central vascular congestion and subtle interstitial prominence with a small amount of layering pleural fluid on the left with associated atelectasis. Findings are nonspecific but could be seen with a developing pulmonary edema pattern.  2.Support devices as described above.   XR ABD X-RAY CHECK DOBHOFF PLACEMENT     Status: None (Preliminary result)    Narrative    XR ABD X-RAY CHECK DOBHOFF PLACEMENT performed on 04/03/2021 3:32 PM    INDICATION: 75 years old Male  OG placement    TECHNIQUE: 1 views of the abdomen; 1 images    COMPARISON: Chest radiograph 04/03/2021.    FINDINGS:    Enteric tube is present  with sidehole projecting over the gastric lumen. Tip projects over the left renal cortex and does not follow the expected course toward the duodenum.        Impression    Enteric tube tip beyond the expected location of the gastric lumen on comparison to CT chest abdomen pelvis 03/31/2021. Given recent surgical history, evaluation with CT abdomen is suggested to confirm positioning.         Assessment/ Plan:  Active Hospital Problems    Diagnosis   . Primary Problem: Intra abdominal hemorrhage       [name] is a 76 y.o. male who is  S/P ex lap with extravasation at surgical bed.      NEURO:  GCS: E4=Spontaneous (Opens Eyes on Own) M6=Normal (Follows Simple Commands) V5=Normal Conversation  Sz prophylaxis:  none  Neurochecks per protocol  SBP < 160  Sedation/analgesia: tylenol. Dilaudid, oxycodone    CARDIOVASCULAR:  Systolic (97XYI), AXK:553 , Min:67 , ZSM:270     Diastolic (78MLJ), QGB:20, Min:53, Max:124     ART-Line  MAP: 78 mmHg   Troponins: No results found for: CKMB   Meds: Lipitor  Pressors: none  PULMONARY:  Airway Ventilator Settings     Not on Ventilator   SpO2  Avg: 93.4 %  Min: 90 %  Max: 99 %  Blood Gas:  Recent Labs     04/03/21  1433 04/03/21  1839 04/03/21  2212 04/04/21  0210   FI02 45 36 36 36   PH 7.33* 7.42 7.42 7.41   PCO2 33* 36 37 41   PO2 73 66* 75 77   BICARBONATE 19.0 24.3 24.7 25.8   BASEEXCESS  --   --   --  1.2*   BASEDEFICIT 7.5* 0.7 0.2  --      Nebs: none  Plan: wean O2 to room air as tolerated    GI:  MNT PROTOCOL FOR DIETITIAN  DIET CLEAR LIQUID Consistency/restriction: NO REDS  ROOM SERVICE:  NEEDS VISIT FOR MENU CHOICES   Recent Labs     04/03/21  1151 04/04/21  0210   ALBUMIN 2.5* 2.0*     Last BM: Date of Last Bowel Movement:  (PTA)  Proph: Colace, Senokot, Pepcid    Plan: suspected ileus NPO with ice chips and sips of water    RENAL/GU:  Recent Labs     04/02/21  0355 04/03/21  1151 04/03/21  1421 04/03/21  1433 04/03/21  1839 04/03/21  2212 04/04/21  0210   SODIUM  136 143 143  --   --   --  143   POTASSIUM 3.9 4.1 4.1  --   --   --  3.3*   CHLORIDE 104 108 110  --   --   --  114*   BICARBONATE  --   --   --    < > 24.3 24.7 25.8   BUN _0 --   --   --  16   CREATININE 0.88 1.41* 1.39*  --   --   --  1.38*   ANIONGAP 8 20* 19*  --   --   --  5   CALCIUM 8.6* 7.6* 8.0*  --   --   --  6.9*   MAGNESIUM 2.2  --   --   --   --   --  1.5*   PHOSPHORUS 2.2* 8.0*  --   --   --   --  4.1*    < > = values in this interval not displayed.       I/O last 24 hours:      Intake/Output Summary (Last 24 hours) at 04/04/2021 0604  Last data filed at 04/04/2021 0600  Gross per 24 hour   Intake 2381.94 ml   Output 1100 ml   Net 1281.94 ml     I/O current shift:  03/04 1900 - 03/05 0659  In: 979.67 [P.O.:600; I.V.:379.67]  Out: 640 [Urine:410; Drains:230]  Foley in critically ill pt for strict I/O's  IV fluids: 75 cc plasmalyte  Diuretics: none    --hypomag - replenish this morning with 4 g   -- monitor BMP, mg, p    HEME:  Recent Labs     04/02/21  0355 04/03/21  1151 04/03/21  1421 04/03/21  1839 04/03/21  2036 04/03/21  2212 04/04/21  0210   HGB 9.3* 12.5* 12.9*   < > 11.4* 11.4* 10.8*  10.8*   HCT 29.6* 38.2* 36.6*   < > 31.3* 31.6* 30.4*  30.4*   PLTCNT 465* 193 155  --   --   --  208  APTT 53.9*  --  32.3  --   --   --   --    INR  --   --  1.31*  --   --   --   --     < > = values in this interval not displayed.     Transfusions: none, Hgb stable / hemodynamically stable  Proph: SCD's, holding pharmacologic ppx     ID:  Temp (24hrs) Max:36.9 C (98.4 F)    Recent Labs     04/03/21  1151 04/03/21  1421 04/04/21  0210   WBC 25.1* 21.9* 17.4*   PMNS 77 96 94   BANDS 7  --   --      Cultures: no growth  ABX: none currently    CD4 and CD8 levels ordered overnight  Genvoya    ENDO:  No results for input(s): GLUCOSEPOC in the last 24 hours.  SSI    MSK:  No skin breakdown    OTHER:  Lines: R femoral central line, 22 ga left MC, 22 ga left wrist, 20 ga right hand, left radial art line,  foley  Activity: Bedrest, HOB 30deg  PT/OT:  ordered  MNT:  ordered    PLAN:  --continue to titrate O2 requirement  -- monitor daily bmp, mg, p   --remove central line and arterial line and foley   -- Follow up with surg onc  --suspected ileus, NPO with ice chips and sips and sips with meds  -- hypomagnesemia - replenish with 4 g  - start plasmalyte at 75 cc/hr     Ranae Palms, DO 04/04/2021, 06:04

## 2021-04-05 DIAGNOSIS — R9431 Abnormal electrocardiogram [ECG] [EKG]: Secondary | ICD-10-CM

## 2021-04-05 DIAGNOSIS — R000 Tachycardia, unspecified: Secondary | ICD-10-CM

## 2021-04-05 LAB — PRODUCT: CRYOPRECIPITATE - UNITS
UNIT DIVISION: 0
UNIT DIVISION: 0

## 2021-04-05 LAB — HEPATIC FUNCTION PANEL
ALBUMIN: 2 g/dL — ABNORMAL LOW (ref 3.4–4.8)
ALKALINE PHOSPHATASE: 86 U/L (ref 45–115)
ALT (SGPT): 154 U/L — ABNORMAL HIGH (ref 10–55)
AST (SGOT): 102 U/L — ABNORMAL HIGH (ref 8–45)
BILIRUBIN DIRECT: 0.1 mg/dL (ref 0.1–0.4)
BILIRUBIN TOTAL: 0.4 mg/dL (ref 0.3–1.3)
PROTEIN TOTAL: 4.8 g/dL — ABNORMAL LOW (ref 6.0–8.0)

## 2021-04-05 LAB — PRODUCT: FFP/PLASMA - UNITS
UNIT DIVISION: 0
UNIT DIVISION: 0

## 2021-04-05 LAB — ECG 12-LEAD
Calculated R Axis: 3 degrees
Calculated T Axis: -7 degrees
QRS Duration: 74 ms
QT Interval: 372 ms
QTC Calculation: 567 ms
Ventricular rate: 140 {beats}/min

## 2021-04-05 LAB — BASIC METABOLIC PANEL
ANION GAP: 10 mmol/L (ref 4–13)
BUN/CREA RATIO: 16 (ref 6–22)
BUN: 23 mg/dL (ref 8–25)
CALCIUM: 8 mg/dL — ABNORMAL LOW (ref 8.8–10.2)
CHLORIDE: 108 mmol/L (ref 96–111)
CO2 TOTAL: 23 mmol/L (ref 23–31)
CREATININE: 1.47 mg/dL — ABNORMAL HIGH (ref 0.75–1.35)
ESTIMATED GFR: 49 mL/min/BSA — ABNORMAL LOW (ref >=60)
GLUCOSE: 117 mg/dL (ref 65–125)
POTASSIUM: 4.8 mmol/L (ref 3.5–5.1)
SODIUM: 141 mmol/L (ref 136–145)

## 2021-04-05 LAB — POC BLOOD GLUCOSE (RESULTS)
GLUCOSE, POC: 132 mg/dl — ABNORMAL HIGH (ref 70–105)
GLUCOSE, POC: 147 mg/dl — ABNORMAL HIGH (ref 70–105)
GLUCOSE, POC: 166 mg/dl — ABNORMAL HIGH (ref 70–105)
GLUCOSE, POC: 204 mg/dl — ABNORMAL HIGH (ref 70–105)
GLUCOSE, POC: 295 mg/dl — ABNORMAL HIGH (ref 70–105)

## 2021-04-05 LAB — CD4/CD8
CD3 # (T CELL): 277 cells/uL — ABNORMAL LOW (ref 892–2436)
CD3 % (T CELL): 63 %
CD4 # (HELPER T CELL): 117 {cells}/uL — ABNORMAL LOW (ref 382–1614)
CD4 % (HELPER T CELL): 27 %
CD4:CD8 RATIO: 0.7 — ABNORMAL LOW (ref 1.0–3.4)
CD8 # (SUPPRESSOR T CELL): 162 cells/uL (ref 157–813)
CD8 % (SUPPRESSOR T CELL): 37 %

## 2021-04-05 LAB — CBC
HCT: 32.2 % — ABNORMAL LOW (ref 38.9–52.0)
HGB: 10.9 g/dL — ABNORMAL LOW (ref 13.4–17.5)
MCH: 29.5 pg (ref 26.0–32.0)
MCHC: 33.9 g/dL (ref 31.0–35.5)
MCV: 87.3 fL (ref 78.0–100.0)
MPV: 11.6 fL (ref 8.7–12.5)
PLATELETS: 303 10*3/uL (ref 150–400)
RBC: 3.69 10*6/uL — ABNORMAL LOW (ref 4.50–6.10)
RDW-CV: 17 % — ABNORMAL HIGH (ref 11.5–15.5)
WBC: 24.7 10*3/uL — ABNORMAL HIGH (ref 3.7–11.0)

## 2021-04-05 LAB — PHOSPHORUS: PHOSPHORUS: 4.5 mg/dL — ABNORMAL HIGH (ref 2.3–4.0)

## 2021-04-05 LAB — ADULT ROUTINE BLOOD CULTURE, SET OF 2 BOTTLES (BACTERIA AND YEAST): BLOOD CULTURE, ROUTINE: NO GROWTH

## 2021-04-05 LAB — MAGNESIUM: MAGNESIUM: 2.8 mg/dL — ABNORMAL HIGH (ref 1.8–2.6)

## 2021-04-05 MED ORDER — DEXTROSE 5% IN WATER (D5W) FLUSH BAG - 250 ML
INTRAVENOUS | Status: DC | PRN
Start: 2021-04-05 — End: 2021-05-04

## 2021-04-05 MED ORDER — SODIUM CHLORIDE 0.9% FLUSH BAG - 250 ML
INTRAVENOUS | Status: DC | PRN
Start: 2021-04-05 — End: 2021-05-04

## 2021-04-05 MED ORDER — SODIUM CHLORIDE 0.9 % (FLUSH) INJECTION SYRINGE
2.0000 mL | INJECTION | Freq: Three times a day (TID) | INTRAMUSCULAR | Status: DC
Start: 2021-04-05 — End: 2021-05-04
  Administered 2021-04-05: 5 mL
  Administered 2021-04-05: 0 mL
  Administered 2021-04-05: 5 mL
  Administered 2021-04-06 – 2021-04-07 (×5): 0 mL
  Administered 2021-04-07: 2 mL
  Administered 2021-04-08 (×3): 0 mL
  Administered 2021-04-09: 2 mL
  Administered 2021-04-09 (×2): 0 mL
  Administered 2021-04-10 (×2): 2 mL
  Administered 2021-04-10: 4 mL
  Administered 2021-04-11: 2 mL
  Administered 2021-04-11: 5 mL
  Administered 2021-04-11 – 2021-04-12 (×3): 4 mL
  Administered 2021-04-12: 0 mL
  Administered 2021-04-13: 4 mL
  Administered 2021-04-13: 0 mL
  Administered 2021-04-13: 4 mL
  Administered 2021-04-14 – 2021-04-15 (×5): 0 mL
  Administered 2021-04-15: 5 mL
  Administered 2021-04-16: 6 mL
  Administered 2021-04-16: 0 mL
  Administered 2021-04-16: 5 mL
  Administered 2021-04-17: 6 mL
  Administered 2021-04-17: 0 mL
  Administered 2021-04-18: 6 mL
  Administered 2021-04-18 – 2021-04-19 (×2): 0 mL
  Administered 2021-04-19: 5 mL
  Administered 2021-04-19: 6 mL
  Administered 2021-04-20 – 2021-04-22 (×5): 0 mL
  Administered 2021-04-22: 6 mL
  Administered 2021-04-22 – 2021-04-23 (×2): 0 mL
  Administered 2021-04-23 (×2): 6 mL
  Administered 2021-04-24 (×2): 0 mL
  Administered 2021-04-24: 6 mL
  Administered 2021-04-25: 0 mL
  Administered 2021-04-25: 6 mL
  Administered 2021-04-25 – 2021-04-26 (×2): 0 mL
  Administered 2021-04-26 (×2): 6 mL
  Administered 2021-04-27: 0 mL
  Administered 2021-04-27: 6 mL
  Administered 2021-04-27: 0 mL
  Administered 2021-04-28: 2 mL
  Administered 2021-04-28 – 2021-04-29 (×3): 0 mL
  Administered 2021-04-29: 6 mL
  Administered 2021-04-29: 0 mL
  Administered 2021-04-30 (×3): 6 mL
  Administered 2021-05-01: 0 mL
  Administered 2021-05-01: 5 mL
  Administered 2021-05-01: 0 mL
  Administered 2021-05-02: 6 mL
  Administered 2021-05-02: 0 mL
  Administered 2021-05-02 – 2021-05-03 (×3): 6 mL
  Administered 2021-05-03 – 2021-05-04 (×2): 0 mL

## 2021-04-05 MED ORDER — SODIUM CHLORIDE 0.9 % (FLUSH) INJECTION SYRINGE
2.0000 mL | INJECTION | INTRAMUSCULAR | Status: DC | PRN
Start: 2021-04-05 — End: 2021-05-04

## 2021-04-05 NOTE — Ancillary Notes (Signed)
[facility]  Spiritual Care Note    Patient Name:  [name]  Date of Encounter:   04/05/2021    Other Pertinent Information: I visited with [name] and his wife at bedside to introduce them to [name]. At the time of visit we explored a bit of [name]' situation and processed how they are making sense of things. They expressed that their [name] faith is important to them and that they are well supported by their pastor and faith community. I let them know of our ongoing availability 24/7 as needed or requested.      04/05/21 0856   Clinical Encounter Type   Reason for Visit General Spiritual Care Visit   Referral From Chaplain-initiated   Declined Spiritual Care Visit At This Time No   Visited With Patient;Spouse   Patient Spiritual Encounters   Spiritual Needs/Issues Uncertainty   Spiritual/Coping Resources Beliefs helpful in coping;Beliefs in God/Sacred/Higher Purpose;Loved/supported by family;Connection to faith group;Image of God/Sacred   Coping Offered empathy;Provided supportive presence;Facilitated story telling;Explored emotions;Explored/supported faith and beliefs   Other Support Services Provided Non-anxious presence   Information/Education Provided Spiritual Care scope of service   Spiritual Care outcomes with Patient   Spiritual/Emotional Processing  Spiritual Care relationship established;Connected to spiritual support    Patient Coping More peaceful   Family Spiritual Encounters   Spiritual Assessment Changes related to patient's diagnosis;Image of God/Sacred   Spiritual/Coping Resources Beliefs helpful in coping;Beliefs in God/Sacred/Higher Purpose;Loved by God/Sacred;Connection to faith group   Coping  Offered empathy;Provided supportive presence;Facilitated spiritual reflection;Identified coping/spiritual resources;Facilitated story telling   Support Services Provided Non-anxious presence   Information Provided Spiritual Care scope of service   Spiritual Care Outcomes with  Family   Spiritual/Emotional Processing Spiritual Care relationship established;Family shared/processed patient's story;Family connected to spiritual support   Family Coping Coping more effectively;More peaceful   Time of Encounters   Start Time [id number]   Stop Time 0856   Duration (minutes) 7 Minutes       Shirlee More, [place]  Pager: [phone]  Total Time of Encounter: 12 min.

## 2021-04-05 NOTE — Nurses Notes (Signed)
Orders for amylase body fluid placed to be collected from jp drain. Pt's total jp output was only 5 cc. Rn waited until pt had a little more output to send sample as it was not enough. Surg Percell Miller, at bedside and notified. Acknowledged information and said to not collect it at this time. Will send sample once there is enough output in jp.

## 2021-04-05 NOTE — Nurses Notes (Signed)
Pt. arrived from [facility] to 9SE-28. Patient setup & oriented to room and Bed Controls/Nurse Button. VSS. Belongings sheet checked by CA. Transfer arrival completed and orders released. Will continue to monitor.

## 2021-04-05 NOTE — Nurses Notes (Signed)
Pt transported to 9SE room 28 and care handed over to bedside RN, Jess. Skin check performed. RN notified of pending amalyse body fluid and to send it down once there is enough output. MD [place] notified of low jp output as well.

## 2021-04-05 NOTE — Care Management Notes (Signed)
[facility] Initial Evaluation    Patient Name: [name]  Date of Birth: 14-Jan-1946  Sex: male  Date/Time of Admission: 04/03/2021  2:01 PM  Room/Bed: 07/A  Payor: [place] [employer] / Plan: Jolayne Panther VACCN/OPTUM / Product Type: Managed Care /   Primary Care Providers:  Alma (General)    Pharmacy Info:   Preferred Pharmacy       [id number]   [address]   Phone: [phone]: [phone]   Hours: Not open 24 hours    [place] [address]    [address] [place]   Phone: [phone]: [phone]   Hours: Not open 24 hours    [place]   Phone: [phone]: [phone]   Hours: 24/7          Emergency Contact Info:   Extended Emergency Contact Information  Primary Emergency Contact: AAdemide [name] Mobile Phone: [phone] Relation: Wife  Preferred language: English  Interpreter needed? No    History:   JRASHEEM FIGIELis a 76y.o., male, admitted with intra abdominal hemorrhage    Height/Weight: 175.3 cm ('5\' 9"'$ ) / 85.3 kg (188 lb 0.8 oz)     LOS: 2 days   Admitting Diagnosis: Intra abdominal hemorrhage [R58]    Assessment:      04/05/21 1045   Assessment Details   Assessment Type Admission   Date of Care Management Update 04/05/21   Date of Next DCP Update 04/08/21   Insurance Information/Type   Insurance type Medicare   Employment/Financial   Patient has Prescription Coverage?  Yes        Name of Insurance Coverage for Medications MEDICARE AND [facility] [employer] COMM[employer]ITY CARE   Financial Concerns none   Living Environment   Select an age group to open "lives with" row.  Adult   Lives With spouse   Living Arrangements house   Able to Return to Prior Arrangements yes   Home Safety   Home Assessment: No Problems Identified   Home  Accessibility bed and bath on same level;stairs to enter home   Care Management Plan   Discharge Planning Status initial meeting   Projected Discharge Date 04/09/21   Discharge plan discussed with: Patient;Spouse   CM will evaluate for rehabilitation potential yes   Discharge Needs Assessment   Equipment Currently Used at Home other (see comments)  [place]   Equipment Needed After Discharge other (see comments)  (TBD)   Discharge Facility/Level of Care Needs Undetermined at this time   Transportation Available car;family or friend will provide   Referral Information   Admission Type inpatient   Address Verified verified-no changes   [place] acute hospital, [place]   [place]  Does the Patient have an Advance Directive? Yes, Patient Does Have Advance Directive for Healthcare Treatment   Type of Advance Directive Completed Medical Living Will;Medical Power of Attorney   Copy of Advance Directives in Chart? 0   Name of [name] Healthcare Surrogate 1. [name]   [place]  Phone Number of MPOA or Healthcare Surrogate 1. [phone]  2. [phone]  Patient Requests Assistance in Having Advance Directive Notarized. N/A   LAY CAREGIVER    Appointed Lay Caregiver? I Decline   Home Main Entrance   Stair Railings, Main Entrance railing on left side (ascending)   Number of Stairs, Main Entrance two   76 year old male admitted from [facility] with intra abdominal hemorrhage. PMH per H&P. This admission, patient ordered labs, imaging, RAP consult, and PT/OT .     Discharge Plan:  Undetermined at this time  Patient lives with spouse  in a single story home with 2 entry steps with single railing. Spouse is able to provide assistance after discharge. Patient uses a freestyle libre at home.  Patient has the following medical equipment available at home: wheelchair, straight cane(s), and shower bench.  Patient is not  utilizing any [place] agency. Patient PCP is Dr. Drema Dallas at the [facility] in [place] and patient last seen in office about 3 months ago. [facility] [place] [facility] [address]  Patient has prescription drug coverage.  Patient manages medications. MPOA/Living Will on file. PT/OT recs pending.      The patient will continue to be evaluated for developing discharge needs.     Case Manager: Ar[place]a Jansky, Wayne  Phone: [phone]

## 2021-04-05 NOTE — Progress Notes (Signed)
[facility]  Surgical Oncology  Progress Note      [name], [name], 76 y.o. male  Date of Birth:  06/23/1945  Date of Admission:  04/03/2021  Date of service: 04/05/2021    Assessment:  This is a 76 y.o. 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 He underwent massive transfusion protocol and ex lap on 04/03/21 at an outside facility with ligation of a bleeding vessel in the liver bed. Patient was subsequently transferred to [facility] postoperaively, intubated. Patient was subsequently extubated later that day on 3/4. Patient remains tachycardic today without further transfusion requirements.       Plan/Recommendations:    - Transfer to SDU today  - Drain amylase today  - Diet: NPO, sips and chips  - Aspiration precautions - HOB 30  - Will send drain amylase today  - DVT ppx: Heparin 5000 U q8H  - Will plan to start heparin non-thrombotic protocol with no bolus tomorrow, 3/7  - OOB TID    Subjective:   Continues to report RUQ pain. Having belching today with some abdominal distention. Denies flatus. Afib with RVR overnight that resolved with metoprolol. Troponin unremarkable. JP 390 cc + 110 cc since midnight. Comfortable on 4 L NC.     Objective  Filed Vitals:    04/05/21 0345 04/05/21 0400 04/05/21 0545 04/05/21 0600   BP: 106/67 121/72 139/77 133/79   Pulse: (!) 104 (!) 105 99 97   Resp: (!) '10 16 12 '$ (!) 11   Temp:  36.3 C (97.3 F)     SpO2: 93% 92% 93% 93%     Physical Exam:   GEN:  AOx4, resting in bed, no acute distress  PULM:   Lung sounds clear to auscultation bilaterally.  Normal respiratory effort.   CV:  Sinus tach  ABD:   Abdomen soft, distended, TTP RUQ. JP in place in RUQ with serous fluid. Incision C/D/I.   MS: Atraumatic.  Distal pulses intact.  Normal strength and range of motion of all extremities.    NEURO:    Alert and oriented to person, place and time.  Cranial nerves grossly intact.    Vascular:  All pulses palpable and equal bilaterally  Integumentary:  Pink, warm, and dry  PSYCHOSOCIAL: Pleasant.  Normal affect.     Labs  Results for orders placed or performed during the hospital encounter of 04/03/21 (from the past 24 hour(s))   TROPONIN-I   Result Value Ref Range    TROPONIN I 18 0 - 30 ng/L   TROPONIN-I   Result Value Ref Range    TROPONIN I 13 0 - 30 ng/L   BASIC METABOLIC PANEL   Result Value Ref Range    SODIUM 141 136 - 145 mmol/L    POTASSIUM 4.8 3.5 - 5.1 mmol/L    CHLORIDE 108 96 - 111 mmol/L    CO2 TOTAL 23 23 - 31 mmol/L    ANION GAP 10 4 - 13 mmol/L    CALCIUM 8.0 (L) 8.8 - 10.2 mg/dL    GLUCOSE 117 65 - 125 mg/dL    BUN 23 8 - 25 mg/dL    CREATININE 1.47 (H) 0.75 - 1.35 mg/dL    BUN/CREA RATIO 16 6 - 22    ESTIMATED GFR 49 (L) >=60 mL/min/BSA  Narrative    Hemolysis can alter results at this level (slight).   MAGNESIUM   Result Value Ref Range    MAGNESIUM 2.8 (H) 1.8 - 2.6 mg/dL    Narrative    Hemolysis can alter results at this level (slight).   CBC   Result Value Ref Range    WBC 24.7 (H) 3.7 - 11.0 x10^3/uL    RBC 3.69 (L) 4.50 - 6.10 x10^6/uL    HGB 10.9 (L) 13.4 - 17.5 g/dL    HCT 32.2 (L) 38.9 - 52.0 %    MCV 87.3 78.0 - 100.0 fL    MCH 29.5 26.0 - 32.0 pg    MCHC 33.9 31.0 - 35.5 g/dL    RDW-CV 17.0 (H) 11.5 - 15.5 %    PLATELETS 303 150 - 400 x10^3/uL    MPV 11.6 8.7 - 12.5 fL   PHOSPHORUS   Result Value Ref Range    PHOSPHORUS 4.5 (H) 2.3 - 4.0 mg/dL    Narrative    Hemolysis can alter results at this level (slight).   HEPATIC FUNCTION PANEL   Result Value Ref Range    ALBUMIN 2.0 (L) 3.4 - 4.8 g/dL     ALKALINE PHOSPHATASE 86 45 - 115 U/L    ALT (SGPT) 154 (H) 10 - 55 U/L    AST (SGOT)  102 (H) 8 - 45 U/L    BILIRUBIN TOTAL 0.4 0.3 - 1.3 mg/dL    BILIRUBIN DIRECT 0.1 0.1 - 0.4 mg/dL    PROTEIN TOTAL 4.8 (L) 6.0 - 8.0 g/dL    Narrative    Hemolysis can alter results at this level  (slight).   POC BLOOD GLUCOSE (RESULTS)   Result Value Ref Range    GLUCOSE, POC 90 70 - 105 mg/dl   POC BLOOD GLUCOSE (RESULTS)   Result Value Ref Range    GLUCOSE, POC 146 (H) 70 - 105 mg/dl   POC BLOOD GLUCOSE (RESULTS)   Result Value Ref Range    GLUCOSE, POC 132 (H) 70 - 105 mg/dl       I/O:  Date 04/04/21 0700 - 04/05/21 0659 04/05/21 0700 - 04/06/21 0659   Shift 0700-1459 1500-2259 2300-0659 24 Hour Total 0700-1459 1500-2259 2300-0659 24 Hour Total   INTAKE   I.V.(mL/kg/hr) 312(0.46) 600(0.88) 600 1512 75(0.11)   75     AL/PA/LA/RA/CVP/IABP/CO 12   12         Volume Infused (electrolyte-A (PLASMALYTE-A) premix infusion) 300 445 334 0274 75   75   IV Piggyback 100   100         Volume (magnesium sulfate 4 G in SW 100 mL premix IVPB) 100   100       Shift Total(mL/kg) 412(4.83) 600(7.03) 600(7.03) 1612(18.9) 75(0.88)   75(0.88)   OUTPUT   Urine(mL/kg/hr) 120(0.18) 150(0.22) 325 595         Urine (Voided) 30 150 325 505         Urine Occurrence 1 x   1 x         Output ([REMOVED] Foley Catheter) 90   90       Drains 130 170 110 410         JP Drain Output [employer] Drain Right;Upper Abdomen) 130 170 110 410       Shift Total(mL/kg) 250(2.93) 320(3.75) 435(5.1) 1005(11.78)       Weight (kg) 85.3 85.3 85.3 85.3 85.3 85.3 85.3 85.3  Radiology  Results for orders placed or performed during the hospital encounter of 04/03/21 (from the past 72 hour(s))   XR CHEST AP MOBILE     Status: None    Narrative    JULES BATY Weyandt  Male, 76 years old.    XR AP MOBILE CHEST performed on 04/03/2021 3:21 PM.    REASON FOR EXAM:  intubated    TECHNIQUE: 1 views/1 images submitted for interpretation.    COMPARISON:  Chest radiograph 04/02/2021.    FINDINGS:  The lungs are mildly hypoinflated. The cardiac silhouette is stable in size. There is central vascular congestion with slight interstitial prominence likely accentuated secondary to hypoinflated lungs. There is a small amount of layering pleural fluid on the left with  associated consolidation/atelectasis slightly more pronounced on today's study when compared to prior. No right-sided pleural effusion is seen. No visible pneumothorax is identified. Surgical staples project along the upper midline abdomen.    SUPPORT DEVICES:  Endotracheal tube tip projects approximately 4.6 cm above the level of the carina.  Esophageal probe tip terminates within the lower thoracic esophagus.  Enteric tube courses below the level of the GE junction with tip not fully included within this study.      Impression    1.Central vascular congestion and subtle interstitial prominence with a small amount of layering pleural fluid on the left with associated atelectasis. Findings are nonspecific but could be seen with a developing pulmonary edema pattern.  2.Support devices as described above.   XR ABD X-RAY CHECK DOBHOFF PLACEMENT     Status: None    Narrative    XR ABD X-RAY CHECK DOBHOFF PLACEMENT performed on 04/03/2021 3:32 PM    INDICATION: 76 years old Male  OG placement    TECHNIQUE: 1 views of the abdomen; 1 images    COMPARISON: Chest radiograph 04/03/2021.    FINDINGS:    Enteric tube is present with sidehole projecting over the gastric lumen. Tip projects over the left renal cortex and does not follow the expected course toward the duodenum.        Impression    Enteric tube tip beyond the expected location of the gastric lumen on comparison to CT chest abdomen pelvis 03/31/2021. Given recent surgical history, evaluation with CT abdomen is suggested to confirm positioning.       Current Medications:  Current Facility-Administered Medications   Medication Dose Route Frequency    acetaminophen (TYLENOL) tablet  650 mg Oral Q4H    atorvastatin (LIPITOR) tablet  40 mg Oral Daily with Breakfast    docusate sodium (COLACE) '10mg'$  per mL oral liquid  100 mg Oral 2x/day    electrolyte-A (PLASMALYTE-A) premix infusion   Intravenous Continuous    elvitegravir-cobicistat-emtricitrabine-tenofovir (GENVOYA)  150-150-200-10 mg per tablet  1 Tablet Oral Daily    gabapentin (NEURONTIN) capsule  300 mg Oral 3x/day    heparin 5,000 unit/mL injection  5,000 Units Subcutaneous Q8HRS    HYDROmorphone (DILAUDID) 1 mg/mL injection  0.2 mg Intravenous Q3H PRN    levothyroxine (SYNTHROID) tablet  88 mcg Oral QAM    ondansetron (ZOFRAN) 2 mg/mL injection  4 mg Intravenous Q6H PRN    oxyCODONE (ROXICODONE) immediate release tablet  5 mg Oral Q4H PRN    oxyCODONE (ROXICODONE) immediate releaste tablet  10 mg Oral Q4H PRN    pantoprazole (PROTONIX) delayed release tablet  40 mg Oral Daily before Breakfast    scopolamine 1 mg over 3 days transdermal patch  1 Patch  Transdermal Q72H    senna concentrate (SENNA) '528mg'$  per 80m oral liquid  10 mL Oral 2x/day    SSIP insulin R human (HUMULIN R) 100 units/mL injection  0-12 Units Subcutaneous Q6H PRN       Paul E. RDuanne Limerick MD   General Surgery  Pager # [phone]   I saw and examined the patient.  I reviewed the resident's note.  I agree with the findings and plan of care as documented in the resident's note.  Any exceptions/additions are edited/noted.    BAaron EdelmanA. BCyndi Bender MD, FACS

## 2021-04-05 NOTE — Progress Notes (Incomplete)
[facility]                                               SICU PROGRESS NOTE    [name], [name]  Date of Admission:  04/03/2021  Date of Service: 04/05/2021  Date of Birth:  1945/04/18    Primary Attending:  Dr.Boone  Primary Service:  Surg Onc    Post Op Day:  S/P      LOS: 2 days      Subjective:    76 yo m PMH prostate ca, HLD, HTN, HIV, hypothyroidism transfer from [facility] with active contrast extravasation at the surgical bed from recent ex lap , distal pancreatectomy, and splenectomy for IPMN lesion.     Extubated yesterday evening. Currently on NC at 4 lpm.Sitting upright in bed this morning. Some moderate abdominal pain and nausea.       Vital Signs:  Temp (24hrs) Max:37 C (67.6 F)      Systolic (72CNO), BSJ:628 , Min:91 , ZMO:294     Diastolic (76LYY), TKP:54, Min:46, Max:111    Temp  Avg: 36.7 C (98.1 F)  Min: 36.3 C (97.3 F)  Max: 37 C (98.6 F)  MAP (Non-Invasive)  Avg: 91 mmHG  Min: 62 mmHG  Max: 117 mmHG  Pulse  Avg: 111.3  Min: 99  Max: 141  Resp  Avg: 19.5  Min: 10  Max: 35  SpO2  Avg: 91.4 %  Min: 89 %  Max: 95 %       Meds  No current outpatient medications on file.     acetaminophen (TYLENOL) tablet, 650 mg, Oral, Q4H  atorvastatin (LIPITOR) tablet, 40 mg, Oral, Daily with Breakfast  docusate sodium (COLACE) '10mg'$  per mL oral liquid, 100 mg, Oral, 2x/day  electrolyte-A (PLASMALYTE-A) premix infusion, , Intravenous, Continuous  elvitegravir-cobicistat-emtricitrabine-tenofovir (GENVOYA) 150-150-200-10 mg per tablet, 1 Tablet, Oral, Daily  gabapentin (NEURONTIN) capsule, 300 mg, Oral, 3x/day  heparin 5,000 unit/mL injection, 5,000 Units, Subcutaneous, Q8HRS  HYDROmorphone (DILAUDID) 1 mg/mL injection, 0.2 mg, Intravenous, Q3H PRN  levothyroxine (SYNTHROID) tablet, 88 mcg, Oral, QAM  ondansetron (ZOFRAN) 2 mg/mL injection, 4 mg, Intravenous, Q6H PRN  oxyCODONE (ROXICODONE) immediate release tablet, 5 mg, Oral, Q4H PRN  oxyCODONE (ROXICODONE) immediate releaste tablet, 10  mg, Oral, Q4H PRN  pantoprazole (PROTONIX) delayed release tablet, 40 mg, Oral, Daily before Breakfast  scopolamine 1 mg over 3 days transdermal patch, 1 Patch, Transdermal, Q72H  senna concentrate (SENNA) '528mg'$  per 9m oral liquid, 10 mL, Oral, 2x/day  SSIP insulin R human (HUMULIN R) 100 units/mL injection, 0-12 Units, Subcutaneous, Q6H PRN        Physical Exam:   General:  NAD. Sitting upright in bed  Eyes:  Conjunctiva clear  HENT:  NCAT, Mucous membranes moist  Lungs:  CTAB, Normal respiratory effort on 4 lpm NC  Cardiovascular:  RR  Abdomen:  S, tender, some distention .  Extremities:  No cyanosis or edema.  Skin:  Skin warm and dry.  Neurologic:  Alert and oriented x3, grossly normal  Psychiatric:  Affect normal.    Labs:  Lab Results Today:    Results for orders placed or performed during the hospital encounter of 04/03/21 (from the past 24 hour(s))   TROPONIN-I   Result Value Ref Range    TROPONIN I 18 0 - 30 ng/L  POC BLOOD GLUCOSE (RESULTS)   Result Value Ref Range    GLUCOSE, POC 90 70 - 105 mg/dl   TROPONIN-I   Result Value Ref Range    TROPONIN I 13 0 - 30 ng/L   POC BLOOD GLUCOSE (RESULTS)   Result Value Ref Range    GLUCOSE, POC 146 (H) 70 - 105 mg/dl   BASIC METABOLIC PANEL   Result Value Ref Range    SODIUM 141 136 - 145 mmol/L    POTASSIUM 4.8 3.5 - 5.1 mmol/L    CHLORIDE 108 96 - 111 mmol/L    CO2 TOTAL 23 23 - 31 mmol/L    ANION GAP 10 4 - 13 mmol/L    CALCIUM 8.0 (L) 8.8 - 10.2 mg/dL    GLUCOSE 117 65 - 125 mg/dL    BUN 23 8 - 25 mg/dL    CREATININE 1.47 (H) 0.75 - 1.35 mg/dL    BUN/CREA RATIO 16 6 - 22    ESTIMATED GFR 49 (L) >=60 mL/min/BSA   MAGNESIUM   Result Value Ref Range    MAGNESIUM 2.8 (H) 1.8 - 2.6 mg/dL   CBC   Result Value Ref Range    WBC 24.7 (H) 3.7 - 11.0 x10^3/uL    RBC 3.69 (L) 4.50 - 6.10 x10^6/uL    HGB 10.9 (L) 13.4 - 17.5 g/dL    HCT 32.2 (L) 38.9 - 52.0 %    MCV 87.3 78.0 - 100.0 fL    MCH 29.5 26.0 - 32.0 pg    MCHC 33.9 31.0 - 35.5 g/dL    RDW-CV 17.0 (H) 11.5 - 15.5  %    PLATELETS 303 150 - 400 x10^3/uL    MPV 11.6 8.7 - 12.5 fL   PHOSPHORUS   Result Value Ref Range    PHOSPHORUS 4.5 (H) 2.3 - 4.0 mg/dL   HEPATIC FUNCTION PANEL   Result Value Ref Range    ALBUMIN 2.0 (L) 3.4 - 4.8 g/dL     ALKALINE PHOSPHATASE 86 45 - 115 U/L    ALT (SGPT) 154 (H) 10 - 55 U/L    AST (SGOT)  102 (H) 8 - 45 U/L    BILIRUBIN TOTAL 0.4 0.3 - 1.3 mg/dL    BILIRUBIN DIRECT 0.1 0.1 - 0.4 mg/dL    PROTEIN TOTAL 4.8 (L) 6.0 - 8.0 g/dL   POC BLOOD GLUCOSE (RESULTS)   Result Value Ref Range    GLUCOSE, POC 132 (H) 70 - 105 mg/dl       Recent Imaging:  Results for orders placed or performed during the hospital encounter of 04/03/21 (from the past 72 hour(s))   XR CHEST AP MOBILE     Status: None    Narrative    KAEDIN HICKLIN Test  Male, 76 years old.    XR AP MOBILE CHEST performed on 04/03/2021 3:21 PM.    REASON FOR EXAM:  intubated    TECHNIQUE: 1 views/1 images submitted for interpretation.    COMPARISON:  Chest radiograph 04/02/2021.    FINDINGS:  The lungs are mildly hypoinflated. The cardiac silhouette is stable in size. There is central vascular congestion with slight interstitial prominence likely accentuated secondary to hypoinflated lungs. There is a small amount of layering pleural fluid on the left with associated consolidation/atelectasis slightly more pronounced on today's study when compared to prior. No right-sided pleural effusion is seen. No visible pneumothorax is identified. Surgical staples project along the upper midline abdomen.    SUPPORT DEVICES:  Endotracheal tube tip projects approximately 4.6 cm above the level of the carina.  Esophageal probe tip terminates within the lower thoracic esophagus.  Enteric tube courses below the level of the GE junction with tip not fully included within this study.      Impression    1.Central vascular congestion and subtle interstitial prominence with a small amount of layering pleural fluid on the left with associated atelectasis. Findings are  nonspecific but could be seen with a developing pulmonary edema pattern.  2.Support devices as described above.   XR ABD X-RAY CHECK DOBHOFF PLACEMENT     Status: None    Narrative    XR ABD X-RAY CHECK DOBHOFF PLACEMENT performed on 04/03/2021 3:32 PM    INDICATION: 76 years old Male  OG placement    TECHNIQUE: 1 views of the abdomen; 1 images    COMPARISON: Chest radiograph 04/03/2021.    FINDINGS:    Enteric tube is present with sidehole projecting over the gastric lumen. Tip projects over the left renal cortex and does not follow the expected course toward the duodenum.        Impression    Enteric tube tip beyond the expected location of the gastric lumen on comparison to CT chest abdomen pelvis 03/31/2021. Given recent surgical history, evaluation with CT abdomen is suggested to confirm positioning.       Assessment/ Plan:  Active Hospital Problems    Diagnosis   . Primary Problem: Intra abdominal hemorrhage       KEVAL NAM is a 76 y.o. male who is  S/P ex lap with extravasation at surgical bed.      NEURO:  GCS: E4=Spontaneous (Opens Eyes on Own) M6=Normal (Follows Simple Commands) V5=Normal Conversation  Sz prophylaxis:  none  Neurochecks per protocol  SBP < 160  Sedation/analgesia: tylenol. Dilaudid, oxycodone, Gabapentin    CARDIOVASCULAR:  Systolic (67YPP), JKD:326 , Min:91 , ZTI:458     Diastolic (09XIP), JAS:50, Min:46, Max:111     ART-Line  MAP: 85 mmHg   Troponins: No results found for: CKMB   Meds: Lipitor  Pressors: none      PULMONARY:  Airway Ventilator Settings     Not on Ventilator   SpO2  Avg: 91.4 %  Min: 89 %  Max: 95 %  Blood Gas:  No results found for this encounter  Nebs: none  Plan: wean O2 to room air as tolerated    GI:  MNT PROTOCOL FOR DIETITIAN  ROOM SERVICE:  NEEDS VISIT FOR MENU CHOICES  DIET NPO - NOW EXCEPT SIPS OF WATER, EXCEPT ICE CHIPS   Recent Labs     04/04/21  0210 04/05/21  0033   ALBUMIN 2.0* 2.0*     Last BM: Date of Last Bowel Movement:  (PTA)  Proph: Colace,  Senokot, Protonix    Plan: suspected ileus NPO with ice chips and sips of water    RENAL/GU:  Recent Labs     04/03/21  1151 04/03/21  1421 04/03/21  1433 04/03/21  1839 04/03/21  2212 04/04/21  0210 04/05/21  0033   SODIUM 143 143  --   --   --  143 141   POTASSIUM 4.1 4.1  --   --   --  3.3* 4.8   CHLORIDE 108 110  --   --   --  114* 108   BICARBONATE  --   --    < > 24.3 24.7 25.8  --  BUN 15 17  --   --   --  16 23   CREATININE 1.41* 1.39*  --   --   --  1.38* 1.47*   ANIONGAP 20* 19*  --   --   --  5 10   CALCIUM 7.6* 8.0*  --   --   --  6.9* 8.0*   MAGNESIUM  --   --   --   --   --  1.5* 2.8*   PHOSPHORUS 8.0*  --   --   --   --  4.1* 4.5*    < > = values in this interval not displayed.       I/O last 24 hours:      Intake/Output Summary (Last 24 hours) at 04/05/2021 0605  Last data filed at 04/05/2021 0600  Gross per 24 hour   Intake 1612 ml   Output 955 ml   Net 657 ml     I/O current shift:  03/05 1900 - 03/06 0659  In: 900 [I.V.:900]  Out: 515 [Urine:425; Drains:90]  Foley in critically ill pt for strict I/O's  IV fluids: 75 cc plasmalyte  Diuretics: none    - monitor BMP, mg, p    HEME:  Recent Labs     04/03/21  1421 04/03/21  1839 04/03/21  2212 04/04/21  0210 04/05/21  0033   HGB 12.9*   < > 11.4* 10.8*  10.8* 10.9*   HCT 36.6*   < > 31.6* 30.4*  30.4* 32.2*   PLTCNT 155  --   --  208 303   APTT 32.3  --   --   --   --    INR 1.31*  --   --   --   --     < > = values in this interval not displayed.     Transfusions: none, Hgb stable / hemodynamically stable  Proph: SCD's, heparin 5,000 TID    ID:  Temp (24hrs) Max:37 C (98.6 F)    Recent Labs     04/03/21  1151 04/03/21  1421 04/04/21  0210 04/05/21  0033   WBC 25.1* 21.9* 17.4* 24.7*   PMNS 77 96 94  --    BANDS 7  --   --   --      Cultures: no growth  ABX: none currently    CD4 and CD8 levels ordered overnight  Genvoya    ENDO:  No results for input(s): GLUCOSEPOC in the last 24 hours.  SSI    MSK:  No skin breakdown    OTHER:  Lines: R femoral  central line, 22 ga left MC, 22 ga left wrist, 20 ga right hand, left radial art line, foley  Activity: Bedrest, HOB 30deg  PT/OT:  ordered  MNT:  ordered    PLAN:  --continue to wean O2 requirement  -RAP consult for assistance  -- monitor daily bmp, mg, p   --remove central line and arterial line and foley   -- Follow up with surg onc  --suspected ileus, NPO with ice chips and sips and sips with meds  - plasmalyte at 75 cc/hr

## 2021-04-05 NOTE — Care Management Notes (Signed)
04/05/2021 [name] discharged to  [facility] on 04/03/2021

## 2021-04-05 NOTE — Pharmacy (Addendum)
Pharmacy Medication Reconciliation    Patient Name: [name], [name]  Date of Service: 04/05/2021  Date of Admission: 04/03/2021  Date of Birth: 02/10/1945  Length of Stay:   2 days   Service: SURG ONCOLOGY      Transitions of Care:  Discharge Pharmacy services were discussed with the patient and the Meds to [facility] flowsheet and preferred pharmacy information were updated in EMR if applicable and able to assess with patient.    Information was collected from:  Pharmacy and Caregiver    [id number] [id number] Phone: [phone]: [address] [address]  [address]  [id number] Phone: [phone]: [address] [address] Phone: [phone]: [phone]    [place] Clap(wife) [phone]   Clarified Prior to Admission Medications:  Prior to Admission medications    Medication Sig Taking Resumed Y/N (RPh) Comments   acetaminophen (TYLENOL) 500 mg Oral Tablet Take 2 Tablets (1,000 mg total) by mouth Every 4 hours as needed for Pain   Yes  yes     amLODIPine (NORVASC) 5 mg Oral Tablet Take 1 Tablet (5 mg total) by mouth Once a day   Yes  no     apixaban (ELIQUIS) 5 mg Oral Tablet Take 2 Tablets (10 mg total) by mouth Twice daily for 14 doses Yes  no  Pt was started on Eliquis at home after last discharge on 3/3-pt took evening dose on Friday 3/3 but was unable to take another dose Saturday morning due to bleeding.     apixaban (ELIQUIS) 5 mg Oral Tablet Take 1 Tablet (5 mg total) by mouth Twice daily for 180 days     no     atorvastatin (LIPITOR) 80 mg Oral Tablet Take 0.5 Tablets (40 mg total) by mouth Every morning with breakfast   Yes  yes     BIOTIN ORAL Take 1 Tablet by mouth Once a day   Yes  no     Blood Sugar Diagnostic (ACCU-CHEK GUIDE TEST STRIPS) Strip   1 Strip Four times a  day - before meals and bedtime Yes  --     cetirizine (ZYRTEC) 10 mg Oral Tablet Take 1 Tablet (10 mg total) by mouth Once a day   Yes  no     cyanocobalamin (VITAMIN B 12) 1,000 mcg Oral Tablet   Take 1 Tablet (1,000 mcg total) by mouth Every morning Yes  no     elviteg-cob-emtri-tenof ALAFEN (GENVOYA) 150-150-200-10 mg Oral Tablet   Take 1 Tablet by mouth Once a day Yes  yes     flash glucose scanning reader (FREESTYLE LIBRE 2 READER) Does not apply Misc   Use as directed with FreeStyle Libre 2 sensors Yes  --     flash glucose sensor (FREESTYLE LIBRE 2 SENSOR) Does not apply Kit   To check blood glucose continuously. Change every 14 days Yes  --     gabapentin (NEURONTIN) 300 mg Oral Capsule Take 1 Capsule (300 mg total) by mouth Three times a day   Yes  yes  Last filled in April 2022-pt wife states pt is still taking   insulin aspart U-100 (NOVOLOG) 100 unit/mL (3 mL) Subcutaneous Insulin Pen   Inject 4  Units under the skin Three times a day With meals Yes  no     insulin glargine (LANTUS SOLOSTAR U-100 INSULIN) 100 unit/mL Subcutaneous Insulin Pen   Inject 15 units under the skin daily Yes  no     insulin lispro (HUMALOG KWIKPEN INSULIN) 100 unit/mL Subcutaneous Insulin Pen Inject 4 units with each meal. 1 box = 140 day supply  Patient not taking: Reported on 03/31/2021     --  Pt not taking   insulin syr/ndl U100 half mark 0.3 mL 31 gauge x 5/16" Syringe   Use a new syringe for each injection. Yes  --     lancets (FREESTYLE LANCETS) 28 gauge Misc Use to test blood sugar 4 times a day - before meals and bedtime   Yes  --     levothyroxine (SYNTHROID) 88 mcg Oral Tablet   Take 1 Tablet (88 mcg total) by mouth Every morning Yes  yes     melatonin 5 mg Oral Tablet Take 1 Tablet (5 mg total) by mouth Every night   Yes  no     MULTIVITAMIN ORAL Take 1 Tablet by mouth Once a day   Yes  no     pantoprazole (PROTONIX) 40 mg Oral Tablet, Delayed Release (E.C.) Take 1 Tablet (40 mg total) by mouth Twice daily 30 minutes  before a meal   Yes  yes     Pen Needle, Disposable, (BD NANO 2ND GEN PEN NEEDLE) 32 gauge x 5/32" Needle   Use a new pen needle for each injection. Yes  --     POTASSIUM ORAL Take 1 Tablet by mouth Once a day Yes  no  OTC     Did patient's home medication list require updates? No    Allergies:    No Known Allergies    Karie Georges    Did pharmacist make suggestions for medication reconciliation? 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 Recommend treating PE with warfarin instead    Harrel Carina, [facility]  04/05/2021, 12:37

## 2021-04-05 NOTE — Nurses Notes (Signed)
Report given to [facility] for room 28. All questions answered at this time.

## 2021-04-06 ENCOUNTER — Inpatient Hospital Stay (HOSPITAL_COMMUNITY): Payer: 59

## 2021-04-06 DIAGNOSIS — Z86711 Personal history of pulmonary embolism: Secondary | ICD-10-CM

## 2021-04-06 DIAGNOSIS — R1011 Right upper quadrant pain: Secondary | ICD-10-CM

## 2021-04-06 DIAGNOSIS — J9811 Atelectasis: Secondary | ICD-10-CM

## 2021-04-06 DIAGNOSIS — I517 Cardiomegaly: Secondary | ICD-10-CM

## 2021-04-06 DIAGNOSIS — Z7901 Long term (current) use of anticoagulants: Secondary | ICD-10-CM

## 2021-04-06 DIAGNOSIS — Z9081 Acquired absence of spleen: Secondary | ICD-10-CM

## 2021-04-06 DIAGNOSIS — R58 Hemorrhage, not elsewhere classified: Secondary | ICD-10-CM

## 2021-04-06 DIAGNOSIS — J90 Pleural effusion, not elsewhere classified: Secondary | ICD-10-CM

## 2021-04-06 DIAGNOSIS — Z90411 Acquired partial absence of pancreas: Secondary | ICD-10-CM

## 2021-04-06 LAB — CBC
HCT: 29.7 % — ABNORMAL LOW (ref 38.9–52.0)
HGB: 10 g/dL — ABNORMAL LOW (ref 13.4–17.5)
MCH: 30 pg (ref 26.0–32.0)
MCHC: 33.7 g/dL (ref 31.0–35.5)
MCV: 89.2 fL (ref 78.0–100.0)
MPV: 11.3 fL (ref 8.7–12.5)
PLATELETS: 385 10*3/uL (ref 150–400)
RBC: 3.33 10*6/uL — ABNORMAL LOW (ref 4.50–6.10)
RDW-CV: 16.8 % — ABNORMAL HIGH (ref 11.5–15.5)
WBC: 23.5 10*3/uL — ABNORMAL HIGH (ref 3.7–11.0)

## 2021-04-06 LAB — PTT (PARTIAL THROMBOPLASTIN TIME)
APTT: 33.2 seconds (ref 24.2–37.5)
APTT: 46.5 seconds — ABNORMAL HIGH (ref 24.2–37.5)
APTT: 54.8 seconds — ABNORMAL HIGH (ref 24.2–37.5)

## 2021-04-06 LAB — BASIC METABOLIC PANEL
ANION GAP: 7 mmol/L (ref 4–13)
BUN/CREA RATIO: 25 — ABNORMAL HIGH (ref 6–22)
BUN: 28 mg/dL — ABNORMAL HIGH (ref 8–25)
CALCIUM: 8.5 mg/dL — ABNORMAL LOW (ref 8.8–10.2)
CHLORIDE: 105 mmol/L (ref 96–111)
CO2 TOTAL: 23 mmol/L (ref 23–31)
CREATININE: 1.14 mg/dL (ref 0.75–1.35)
ESTIMATED GFR: 67 mL/min/BSA (ref >=60)
GLUCOSE: 179 mg/dL — ABNORMAL HIGH (ref 65–125)
POTASSIUM: 4.5 mmol/L (ref 3.5–5.1)
SODIUM: 135 mmol/L — ABNORMAL LOW (ref 136–145)

## 2021-04-06 LAB — MAGNESIUM: MAGNESIUM: 2.4 mg/dL (ref 1.8–2.6)

## 2021-04-06 LAB — POC BLOOD GLUCOSE (RESULTS)
GLUCOSE, POC: 187 mg/dl — ABNORMAL HIGH (ref 70–105)
GLUCOSE, POC: 160 mg/dl — ABNORMAL HIGH (ref 70–105)
GLUCOSE, POC: 159 mg/dl — ABNORMAL HIGH (ref 70–105)
GLUCOSE, POC: 154 mg/dl — ABNORMAL HIGH (ref 70–105)

## 2021-04-06 LAB — AMYLASE BODY FLUID: AMYLASE BODY FLUID: >6554 U/L

## 2021-04-06 LAB — PHOSPHORUS: PHOSPHORUS: 2.9 mg/dL (ref 2.3–4.0)

## 2021-04-06 MED ORDER — HEPARIN 25,000 UNIT/250 ML (100 UNIT/ML) IN NS IV SOLN
12.0000 [IU]/kg/h | INTRAVENOUS | Status: DC
Start: 2021-04-06 — End: 2021-04-10
  Administered 2021-04-06: 12 [IU]/kg/h via INTRAVENOUS
  Administered 2021-04-06 (×2): 14 [IU]/kg/h via INTRAVENOUS
  Administered 2021-04-07: 16 [IU]/kg/h via INTRAVENOUS
  Administered 2021-04-07: 20 [IU]/kg/h via INTRAVENOUS
  Administered 2021-04-07: 16 [IU]/kg/h via INTRAVENOUS
  Administered 2021-04-07: 20 [IU]/kg/h via INTRAVENOUS
  Administered 2021-04-07: 22 [IU]/kg/h via INTRAVENOUS
  Administered 2021-04-07: 16 [IU]/kg/h via INTRAVENOUS
  Administered 2021-04-08 (×3): 22 [IU]/kg/h via INTRAVENOUS
  Administered 2021-04-09: 0 [IU]/kg/h via INTRAVENOUS
  Filled 2021-04-06 (×4): qty 250

## 2021-04-06 MED ORDER — DOCUSATE SODIUM 100 MG CAPSULE
100.0000 mg | ORAL_CAPSULE | Freq: Two times a day (BID) | ORAL | Status: DC
Start: 2021-04-06 — End: 2021-05-15
  Administered 2021-04-06 – 2021-04-08 (×5): 100 mg via ORAL
  Administered 2021-04-08: 0 mg via ORAL
  Administered 2021-04-09 – 2021-04-11 (×6): 100 mg via ORAL
  Administered 2021-04-12: 0 mg via ORAL
  Administered 2021-04-12: 100 mg via ORAL
  Administered 2021-04-13 – 2021-04-15 (×5): 0 mg via ORAL
  Administered 2021-04-15: 100 mg via ORAL
  Administered 2021-04-16: 0 mg via ORAL
  Administered 2021-04-16: 100 mg via ORAL
  Administered 2021-04-17: 0 mg via ORAL
  Administered 2021-04-17 – 2021-04-22 (×10): 100 mg via ORAL
  Administered 2021-04-22: 0 mg via ORAL
  Administered 2021-04-23 – 2021-04-24 (×3): 100 mg via ORAL
  Administered 2021-04-24 – 2021-04-26 (×4): 0 mg via ORAL
  Administered 2021-04-26: 100 mg via ORAL
  Administered 2021-04-27 – 2021-05-14 (×25): 0 mg via ORAL
  Filled 2021-04-06 (×36): qty 1

## 2021-04-06 MED ORDER — SENNOSIDES 8.6 MG-DOCUSATE SODIUM 50 MG TABLET
1.0000 | ORAL_TABLET | Freq: Two times a day (BID) | ORAL | Status: DC
Start: 2021-04-06 — End: 2021-05-28
  Administered 2021-04-06 – 2021-04-07 (×4): 1 via ORAL
  Administered 2021-04-08: 0 via ORAL
  Administered 2021-04-08 – 2021-04-12 (×8): 1 via ORAL
  Administered 2021-04-12 – 2021-04-14 (×5): 0 via ORAL
  Administered 2021-04-15: 1 via ORAL
  Administered 2021-04-15: 0 via ORAL
  Administered 2021-04-16: 1 via ORAL
  Administered 2021-04-16 – 2021-04-17 (×2): 0 via ORAL
  Administered 2021-04-17 – 2021-04-22 (×10): 1 via ORAL
  Administered 2021-04-22: 0 via ORAL
  Administered 2021-04-23: 1 via ORAL
  Administered 2021-04-23: 0 via ORAL
  Administered 2021-04-24 (×2): 1 via ORAL
  Administered 2021-04-25: 0 via ORAL
  Administered 2021-04-25 – 2021-04-26 (×2): 1 via ORAL
  Administered 2021-04-26 – 2021-05-14 (×26): 0 via ORAL
  Administered 2021-05-15 – 2021-05-16 (×3): 1 via ORAL
  Administered 2021-05-16 – 2021-05-17 (×2): 0 via ORAL
  Administered 2021-05-17 – 2021-05-18 (×2): 1 via ORAL
  Administered 2021-05-18: 0 via ORAL
  Administered 2021-05-19: 1 via ORAL
  Administered 2021-05-19 – 2021-05-20 (×2): 0 via ORAL
  Administered 2021-05-20: 1 via ORAL
  Administered 2021-05-21: 0 via ORAL
  Administered 2021-05-21: 1 via ORAL
  Administered 2021-05-22 (×2): 0 via ORAL
  Administered 2021-05-23 – 2021-05-24 (×4): 1 via ORAL
  Administered 2021-05-25 – 2021-05-26 (×3): 0 via ORAL
  Administered 2021-05-26 – 2021-05-28 (×4): 1 via ORAL
  Filled 2021-04-06 (×61): qty 1

## 2021-04-06 NOTE — Care Plan (Signed)
Patient to tolerate being npo, heparin drip to start today  Problem: Adult Inpatient Plan of Care  Goal: Plan of Care Review  Outcome: Ongoing (see interventions/notes)  Goal: Patient-Specific Goal (Individualized)  Outcome: Ongoing (see interventions/notes)  Flowsheets (Taken 04/06/2021 0750)  Individualized Care Needs: frequent turns, monitor pain, jp drainage  Anxieties, Fears or Concerns: wants to increase diet  Goal: Absence of Hospital-Acquired Illness or Injury  Outcome: Ongoing (see interventions/notes)  Intervention: Identify and Manage Fall Risk  Recent Flowsheet Documentation  Taken 04/06/2021 0750 by Kerman Passey, RN  Safety Promotion/Fall Prevention:   activity supervised   safety round/check completed   nonskid shoes/slippers when out of bed   motion sensor pad activated  Intervention: Prevent Skin Injury  Recent Flowsheet Documentation  Taken 04/06/2021 0750 by Kerman Passey, RN  Skin Protection:   adhesive use limited   tubing/devices free from skin contact  Intervention: Prevent and Manage VTE (Venous Thromboembolism) Risk  Recent Flowsheet Documentation  Taken 04/06/2021 0750 by Kerman Passey, RN  VTE Prevention/Management:   foot pump device on   sequential compression devices on  Goal: Optimal Comfort and Wellbeing  Outcome: Ongoing (see interventions/notes)  Intervention: Provide Person-Centered Care  Recent Flowsheet Documentation  Taken 04/06/2021 0750 by Kerman Passey, RN  Trust Relationship/Rapport:   care explained   choices provided   emotional support provided  Goal: Rounds/Family Conference  Outcome: Ongoing (see interventions/notes)     Problem: Mechanical Ventilation Invasive  Goal: Effective Communication  Outcome: Ongoing (see interventions/notes)  Intervention: Ensure Effective Communication  Recent Flowsheet Documentation  Taken 04/06/2021 0750 by Kerman Passey, RN  Trust Relationship/Rapport:   care explained   choices provided   emotional support provided  Goal: Optimal Device  Function  Outcome: Ongoing (see interventions/notes)  Goal: Mechanical Ventilation Liberation  Outcome: Ongoing (see interventions/notes)  Intervention: Promote Extubation and Mechanical Ventilation Liberation  Recent Flowsheet Documentation  Taken 04/06/2021 0750 by Kerman Passey, RN  Environmental Support:   calm environment promoted   caregiver consistency promoted  Goal: Optimal Nutrition Delivery  Outcome: Ongoing (see interventions/notes)  Goal: Absence of Device-Related Skin and Tissue Injury  Outcome: Ongoing (see interventions/notes)  Intervention: Maintain Skin and Tissue Health  Recent Flowsheet Documentation  Taken 04/06/2021 0750 by Kerman Passey, RN  Device Skin Pressure Protection: absorbent pad utilized/changed  Goal: Absence of Ventilator-Induced Lung Injury  Outcome: Ongoing (see interventions/notes)     Problem: Fall Injury Risk  Goal: Absence of Fall and Fall-Related Injury  Outcome: Ongoing (see interventions/notes)  Intervention: Promote Injury-Free Environment  Recent Flowsheet Documentation  Taken 04/06/2021 0750 by Kerman Passey, RN  Safety Promotion/Fall Prevention:   activity supervised   safety round/check completed   nonskid shoes/slippers when out of bed   motion sensor pad activated     Problem: Skin Injury Risk Increased  Goal: Skin Health and Integrity  Outcome: Ongoing (see interventions/notes)  Intervention: Optimize Skin Protection  Recent Flowsheet Documentation  Taken 04/06/2021 0750 by Kerman Passey, RN  Pressure Reduction Techniques:   frequent weight shift encouraged   heels elevated off bed  Pressure Reduction Devices: heels elevated off bed  Skin Protection:   adhesive use limited   tubing/devices free from skin contact  Activity Management:   activity adjusted per tolerance   activity clustered for rest periods   bedrest

## 2021-04-06 NOTE — Care Plan (Signed)
PT. Has been tolerating well with his cough assist. Pt. BS are diminished. Pt. Spo2 in the mid 90's on a NC. Will continue to monitor pt.

## 2021-04-06 NOTE — Care Plan (Signed)
[place]  Physical Therapy Initial Evaluation    Patient Name: [name]  Date of Birth: [birth year]  Height: Height: 175.3 cm ('5\' 9"'$ )  Weight: Weight: 85.3 kg (188 lb 0.8 oz)  Room/Bed: 28/A  Payor: MEDICARE / Plan: MEDICARE PART A AND B / Product Type: Medicare /     Assessment:      [name] demonstrated symmetrical LE weakness from limited mobility. He reports abdomina pain is too severe to fully stand upright, and Min Ax2 required to stand at this time. SPO2 decreased to 86% on 5 L NC while standing. Respiratory status more of a limiting factor to mobility at this time. Anticipate need for IRF placement upon d/c as his goal is to return to independence as rapidly as possible so he may be ready to golf by Spring.    Discharge Needs:    Equipment Recommendation: TBD    Discharge Disposition: inpatient rehabilitation facility  JUSTIFICATION OF DISCHARGE RECOMMENDATION   Based on current diagnosis, functional performance prior to admission, and current functional performance, this patient requires continued PT services in inpatient rehabilitation facility in order to achieve significant functional improvements in these deficit areas: aerobic capacity/endurance, gait, locomotion, and balance, ergonomics and body mechanics, muscle performance, posture, ROM (range of motion). The above recommendation is based upon the current examination and evaluation performed on this date. As subsequent sessions are completed, recommendations will be updated accordingly.    Plan:   Current Intervention: balance training, bed mobility training, gait training, patient/family education, postural re-education, ROM (range of motion), strengthening, stretching, transfer training  To provide physical therapy services minimum of 2x/week  for duration of until discharge.    The risks/benefits of therapy have been discussed with the patient/caregiver and he/she is in agreement with the  established plan of care.     Subjective & Objective      04/06/21 1040   Therapist Pager   PT Assigned/ Pager # Sharrie Rothman [phone]   Rehab Session   Document Type evaluation   Total PT Minutes: 21   Patient Effort good   Symptoms Noted During/After Treatment increased pain   Symptoms Noted Comment Abdominal pain upon standing.   General Information   Patient Profile Reviewed yes   Onset of Illness/Injury or Date of Surgery 04/03/21   Pertinent History of Current Functional Problem 76 y.o. 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 He underwent massive transfusion protocol and ex lap on 04/03/21 with ligation of a bleeding vessel   Medical Lines PIV Line;Telemetry   Respiratory Status nasal cannula   Existing Precautions/Restrictions full code;fall precautions;contact isolation   Mutuality/Individual Preferences   Individualized Care Needs OOB Ax1 pivot to chair   Living Environment   Lives With spouse   Living Arrangements house   Home Assessment: No Problems Identified   Home Accessibility bed and bath on same level;stairs to enter home   Home Main Entrance   Number of Stairs, Main Entrance two   Stair Railings, Main Entrance railing on left side (ascending)   Functional Level Prior   Ambulation 0 - independent   Transferring 0 - independent   Toileting 0 - independent   Bathing 0 - independent   Dressing 0 - independent   Eating 0 - independent   Prior Functional Level Comment  Pt typically golfs 6d/wk in good weather. Fully independent at baseline. Owns a FWW, bath bench w/grab bar in tub, straight cane, and a w/c if needed.   Pre Treatment Status   Pre Treatment Patient Status Patient sitting in bedside chair or w/c;Call light within reach;Telephone within reach;Sitter select activated;Nurse approved session   Support Present Pre Treatment  Family  present   Communication Pre Treatment  Nurse   Cognitive Assessment/Interventions   Behavior/Mood Observations behavior appropriate to situation, WNL/WFL   Orientation Status oriented x 4   Attention WNL/WFL   Follows Commands WNL   Vital Signs   Pre-Treatment Heart Rate (beats/min) 114   Post-treatment Heart Rate (beats/min) 120   Pre SpO2 (%) 91   O2 Delivery Pre Treatment supplemental O2   Post SpO2 (%) 91   O2 Delivery Post Treatment supplemental O2   Vitals Comment SpO2 decreased to 86% while standing. On 5 L NC. SpO2 returned to 90s within 1 minute.   Pain Assessment   Pre/Posttreatment Pain Comment Pt reports increased abdominal pain upon standing. Unable to fully stand upright for this reason.   General Extremity Assessment   Comment BLE weakness noted, but weakness is symmetrical.   RLE Assessment   RLE Assessment X-Exceptions   RLE ROM WFL   RLE Strength Quads, DF/PF: 4+/5, Hamstrings, hip flexors 3+/5   LLE Assessment   LLE Assessment X-Exceptions   LLE ROM WFL   LLE Strength Quads, DF/PF: 4+/5, Hamstrings, hip flexors 3+/5   Transfer Assessment/Treatment   Sit-Stand Independence minimum assist (75% patient effort);2 person assist required;verbal cues required   Stand-Sit Independence minimum assist (75% patient effort);2 person assist required;verbal cues required   Sit-Stand-Sit, Assist Device side by side   Transfer Impairments strength decreased;endurance;balance impaired   Transfer Comment Pt unable to become fully upright 2/2 abdominal pain while standing. Stood a total of 20 seconds. SpO2 dropping to 86% during this period while on 5 L NC.   Gait Assessment/Treatment   Independence  unable to perform   Balance Skill Training   Sitting Balance: Static good balance   Sitting, Dynamic (Balance) fair + balance   Sit-to-Stand Balance fair balance   Standing Balance: Static fair - balance   Post Treatment Status   Post Treatment Patient Status Patient sitting in bedside chair or w/c;Call light within  reach;Telephone within reach;Sitter select activated   Support Present Post Treatment  Family present   Environmental manager   Plan of Care Review   Plan Of Care Reviewed With patient   Basic Mobility Am-PAC/6Clicks Score (APPROVED PT Staff, [facility], [place], [place], [place], and [facility])   Turning in bed without bedrails 3   Lying on back to sitting on edge of flat bed 3   Moving to and from a bed to a chair 2   Standing up from chair 2   Walk in room 2   Climbing 3-5 steps with railing 1   6 Clicks Raw Score total 13   Standardized (t-scale) score 33.99   CMS 0-100% Score 57.65   CMS Modifier CK   Patient Mobility Goal ([place]) 4- Move to chair 3X/day   Exercise/Activity Level Performed 4- Transferred to chair/commode   Physical Therapy Clinical Impression   Assessment [name] Adachi demonstrated symmetrical LE weakness from limited mobility. He reports abdomina pain is too severe to fully stand upright, and Min Ax2 required to stand at this time. SPO2 decreased to 86% on 5 L NC while standing.  Respiratory status more of a limiting factor to mobility at this time. Anticipate need for IRF placement upon d/c as his goal is to return to independence as rapidly as possible so he may be ready to golf by Spring.   Patient/Family Goals Statement to return to daily golfing   Criteria for Skilled Therapeutic yes;skilled treatment is necessary   Pathology/Pathophysiology Noted musculoskeletal   Impairments Found (describe specific impairments) aerobic capacity/endurance;gait, locomotion, and balance;ergonomics and body mechanics;muscle performance;posture;ROM (range of motion)   Functional Limitations in Following  self-care;home management;community/leisure   Disability: Inability to Perform community/leisure   Rehab Potential good   Therapy Frequency minimum of 2x/week   Predicted Duration of Therapy Intervention (days/wks) until discharge   Anticipated Equipment Needs at Discharge (PT) TBD   Anticipated Discharge  Disposition inpatient rehabilitation facility   Evaluation Complexity Justification   Patient History: Co-morbidity/factors that impact Plan of Care 3 or more that impact Plan of Care   Examination Components 4 or more Exam elements addressed   Presentation Evolving: Symptoms, complaints, characteristics of condition changing &/or cognitive deficits present   Clinical Decision Making Moderate complexity   Evaluation Complexity Moderate complexity   Care Plan Goals   PT Rehab Goals Bed Mobility Goal;Gait Training Goal;Transfer Training Goal   Bed Mobility Goal   Bed Mobility Goal, Date Established 04/06/21   Bed Mobility Goal, Time to Achieve by discharge   Bed Mobility Goal, Activity Type all bed mobility activities   Bed Mobility Goal, Independence Level modified independence   Gait Training  Goal, Distance to Achieve   Gait Training  Goal, Date Established 04/06/21   Gait Training  Goal, Time to Achieve by discharge   Gait Training  Goal, Independence Level modified independence   Gait Training  Goal, Assist Device least restricted assistive device   Gait Training  Goal, Distance to Achieve 50'   Transfer Training Goal   Transfer Training Goal, Date Established 04/06/21   Transfer Training Goal, Time to Achieve by discharge   Transfer Training Goal, Activity Type all transfers   Transfer Training Goal, Independence Level modified independence   Transfer Training Goal, Assist Device least restrictrictive assistive device   Planned Therapy Interventions, PT Eval   Planned Therapy Interventions (PT) balance training;bed mobility training;gait training;patient/family education;postural re-education;ROM (range of motion);strengthening;stretching;transfer training       Therapist:   Lorenda Peck, PT ,DPT  Board-Certified Cardiovascular & Pulmonary Clinical Specialist  Pager #: [phone]

## 2021-04-06 NOTE — Progress Notes (Signed)
[facility]  Surgical Oncology  Progress Note      [name], [name], 76 y.o. male  Date of Birth:  17-Jan-1946  Date of Admission:  04/03/2021  Date of service: 04/06/2021    Assessment:  This is a 76 y.o. 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 He underwent massive transfusion protocol and ex lap on 04/03/21 at an outside facility with ligation of a bleeding vessel in the liver bed. Patient was subsequently transferred to [facility] postoperaively, intubated. Patient was subsequently extubated later that day on 3/4. Patient remains tachycardic today without further transfusion requirements.       Plan/Recommendations:  - Diet: NPO, sips and chips  - Aspiration precautions - HOB 30  - JP with 120 cc   Amylase level > 6,554  - PE   - Starting low intensity, no bolus heparin drip today   - Encouraged IS and ambulation   - Aggressive plum toilet   - Wean O2   - OOB TID  - PT/OT ordered    Subjective:   Pain controlled. TTP along midline. Tolerated sips and chips.    Objective  Filed Vitals:    04/05/21 1800 04/05/21 1920 04/05/21 2315 04/06/21 0400   BP: (!) 154/78 137/85 (!) 153/90 (!) 129/92   Pulse: (!) 117 (!) 112 (!) 103 (!) 102   Resp: 18 (!) 28 14 (!) 22   Temp: 36.7 C (98.1 F) 36.8 C (98.2 F) 36.5 C (97.7 F) 36.5 C (97.7 F)   SpO2: 91% 93% 93% 92%     Physical Exam:   GEN:  AOx4, resting in bed, no acute distress  PULM:   Lung sounds clear to auscultation bilaterally.  Normal respiratory effort.   CV:  Sinus tach  ABD:   Abdomen soft, distended, TTP RUQ. JP in place in RUQ with serous fluid. Incision C/D/I.   MS: Atraumatic.  Distal pulses intact.  Normal strength and range of motion of all extremities.    NEURO:   Alert and oriented to person, place and time.  Cranial nerves grossly intact.    Vascular:  All  pulses palpable and equal bilaterally  Integumentary:  Pink, warm, and dry  PSYCHOSOCIAL: Pleasant.  Normal affect.     Labs  Results for orders placed or performed during the hospital encounter of 04/03/21 (from the past 24 hour(s))   AMYLASE BODY FLUID   Result Value Ref Range    AMYLASE BODY FLUID >6,554 No reference intervals are established. U/L   BASIC METABOLIC PANEL   Result Value Ref Range    SODIUM 135 (L) 136 - 145 mmol/L    POTASSIUM 4.5 3.5 - 5.1 mmol/L    CHLORIDE 105 96 - 111 mmol/L    CO2 TOTAL 23 23 - 31 mmol/L    ANION GAP 7 4 - 13 mmol/L    CALCIUM 8.5 (L) 8.8 - 10.2 mg/dL    GLUCOSE 179 (H) 65 - 125 mg/dL    BUN 28 (H) 8 - 25 mg/dL    CREATININE 1.14 0.75 - 1.35 mg/dL    BUN/CREA RATIO 25 (H) 6 - 22    ESTIMATED GFR 67 >=60 mL/min/BSA   CBC   Result Value Ref Range    WBC 23.5 (H) 3.7 - 11.0 x10^3/uL    RBC  3.33 (L) 4.50 - 6.10 x10^6/uL    HGB 10.0 (L) 13.4 - 17.5 g/dL    HCT 29.7 (L) 38.9 - 52.0 %    MCV 89.2 78.0 - 100.0 fL    MCH 30.0 26.0 - 32.0 pg    MCHC 33.7 31.0 - 35.5 g/dL    RDW-CV 16.8 (H) 11.5 - 15.5 %    PLATELETS 385 150 - 400 x10^3/uL    MPV 11.3 8.7 - 12.5 fL   MAGNESIUM   Result Value Ref Range    MAGNESIUM 2.4 1.8 - 2.6 mg/dL   PHOSPHORUS   Result Value Ref Range    PHOSPHORUS 2.9 2.3 - 4.0 mg/dL   POC BLOOD GLUCOSE (RESULTS)   Result Value Ref Range    GLUCOSE, POC 204 (H) 70 - 105 mg/dl   POC BLOOD GLUCOSE (RESULTS)   Result Value Ref Range    GLUCOSE, POC 147 (H) 70 - 105 mg/dl   POC BLOOD GLUCOSE (RESULTS)   Result Value Ref Range    GLUCOSE, POC 187 (H) 70 - 105 mg/dl       I/O:  Date 04/05/21 0700 - 04/06/21 0659 04/06/21 0700 - 04/07/21 0659   Shift 0700-1459 1500-2259 2300-0659 24 Hour Total 0700-1459 1500-2259 2300-0659 24 Hour Total   INTAKE   I.V.(mL/kg/hr) 600(0.88) 380(0.56)  980(0.48)         Med (IV) Flush Volume  5  5         Volume Infused (electrolyte-A (PLASMALYTE-A) premix infusion) 600 375  975       Shift Total(mL/kg) 600(7.03) 380(4.45)  980(11.49)        OUTPUT   Urine(mL/kg/hr) 500(0.73) 130(0.19) 350(0.51) 980(0.48)         Urine (Voided) 500 130 350 980         Urine Occurrence 3 x 1 x 1 x 5 x       Drains '5 5  10         '$ JP Drain Output ([employer]att Drain Right;Upper Abdomen) '5 5  10       '$ Shift Total(mL/kg) 505(5.92) 135(1.58) 350(4.1) 990(11.61)       Weight (kg) 85.3 85.3 85.3 85.3 85.3 85.3 85.3 85.3       Radiology  Results for orders placed or performed during the hospital encounter of 04/03/21 (from the past 72 hour(s))   XR CHEST AP MOBILE     Status: None    Narrative    STONEWALL DOSS Lizak  Male, 76 years old.    XR AP MOBILE CHEST performed on 04/03/2021 3:21 PM.    REASON FOR EXAM:  intubated    TECHNIQUE: 1 views/1 images submitted for interpretation.    COMPARISON:  Chest radiograph 04/02/2021.    FINDINGS:  The lungs are mildly hypoinflated. The cardiac silhouette is stable in size. There is central vascular congestion with slight interstitial prominence likely accentuated secondary to hypoinflated lungs. There is a small amount of layering pleural fluid on the left with associated consolidation/atelectasis slightly more pronounced on today's study when compared to prior. No right-sided pleural effusion is seen. No visible pneumothorax is identified. Surgical staples project along the upper midline abdomen.    SUPPORT DEVICES:  Endotracheal tube tip projects approximately 4.6 cm above the level of the carina.  Esophageal probe tip terminates within the lower thoracic esophagus.  Enteric tube courses below the level of the GE junction with tip not fully included within this study.  Impression    1.Central vascular congestion and subtle interstitial prominence with a small amount of layering pleural fluid on the left with associated atelectasis. Findings are nonspecific but could be seen with a developing pulmonary edema pattern.  2.Support devices as described above.   XR ABD X-RAY CHECK DOBHOFF PLACEMENT     Status: None    Narrative    XR ABD  X-RAY CHECK DOBHOFF PLACEMENT performed on 04/03/2021 3:32 PM    INDICATION: 76 years old Male  OG placement    TECHNIQUE: 1 views of the abdomen; 1 images    COMPARISON: Chest radiograph 04/03/2021.    FINDINGS:    Enteric tube is present with sidehole projecting over the gastric lumen. Tip projects over the left renal cortex and does not follow the expected course toward the duodenum.        Impression    Enteric tube tip beyond the expected location of the gastric lumen on comparison to CT chest abdomen pelvis 03/31/2021. Given recent surgical history, evaluation with CT abdomen is suggested to confirm positioning.       Current Medications:  Current Facility-Administered Medications   Medication Dose Route Frequency    acetaminophen (TYLENOL) tablet  650 mg Oral Q4H    atorvastatin (LIPITOR) tablet  40 mg Oral Daily with Breakfast    D5W 250 mL flush bag   Intravenous Q15 Min PRN    docusate sodium (COLACE) '10mg'$  per mL oral liquid  100 mg Oral 2x/day    electrolyte-A (PLASMALYTE-A) premix infusion   Intravenous Continuous    elvitegravir-cobicistat-emtricitrabine-tenofovir (GENVOYA) 150-150-200-10 mg per tablet  1 Tablet Oral Daily    gabapentin (NEURONTIN) capsule  300 mg Oral 3x/day    heparin 5,000 unit/mL injection  5,000 Units Subcutaneous Q8HRS    HYDROmorphone (DILAUDID) 1 mg/mL injection  0.2 mg Intravenous Q3H PRN    levothyroxine (SYNTHROID) tablet  88 mcg Oral QAM    NS 250 mL flush bag   Intravenous Q15 Min PRN    NS flush syringe  2-6 mL Intracatheter Q8HRS    NS flush syringe  2-6 mL Intracatheter Q1 MIN PRN    ondansetron (ZOFRAN) 2 mg/mL injection  4 mg Intravenous Q6H PRN    oxyCODONE (ROXICODONE) immediate release tablet  5 mg Oral Q4H PRN    oxyCODONE (ROXICODONE) immediate releaste tablet  10 mg Oral Q4H PRN    pantoprazole (PROTONIX) delayed release tablet  40 mg Oral Daily before Breakfast    scopolamine 1 mg over 3 days transdermal patch  1 Patch Transdermal Q72H    senna concentrate (SENNA)  '528mg'$  per 67m oral liquid  10 mL Oral 2x/day    SSIP insulin R human (HUMULIN R) 100 units/mL injection  0-12 Units Subcutaneous Q6H PRN     Pathology: Pending      AAllene Pyo PA-C  04/06/2021, 08:08    I personally saw and examined the patient. See physician's assistant note for additional details.     Restart heparin gtt, no bolus today.  Pulm toilet. Cont JP to suction.  Await return bowel function.    BLynwood Dawley MD

## 2021-04-07 ENCOUNTER — Inpatient Hospital Stay (HOSPITAL_COMMUNITY): Payer: 59

## 2021-04-07 DIAGNOSIS — R918 Other nonspecific abnormal finding of lung field: Secondary | ICD-10-CM

## 2021-04-07 DIAGNOSIS — J90 Pleural effusion, not elsewhere classified: Secondary | ICD-10-CM

## 2021-04-07 LAB — TYPE AND CROSS RED CELLS - UNITS
ABO/RH(D): A NEG
ANTIBODY SCREEN: NEGATIVE
UNITS ORDERED: 3

## 2021-04-07 LAB — BASIC METABOLIC PANEL
ANION GAP: 8 mmol/L (ref 4–13)
BUN/CREA RATIO: 24 — ABNORMAL HIGH (ref 6–22)
BUN: 20 mg/dL (ref 8–25)
CALCIUM: 8.7 mg/dL — ABNORMAL LOW (ref 8.8–10.2)
CHLORIDE: 104 mmol/L (ref 96–111)
CO2 TOTAL: 24 mmol/L (ref 23–31)
CREATININE: 0.82 mg/dL (ref 0.75–1.35)
ESTIMATED GFR: >90 mL/min/BSA (ref >=60)
GLUCOSE: 146 mg/dL — ABNORMAL HIGH (ref 65–125)
POTASSIUM: 4.2 mmol/L (ref 3.5–5.1)
SODIUM: 136 mmol/L (ref 136–145)

## 2021-04-07 LAB — BPAM PACKED CELL ORDER
UNIT DIVISION: 0
UNIT DIVISION: 0

## 2021-04-07 LAB — CBC
HCT: 28.5 % — ABNORMAL LOW (ref 38.9–52.0)
HGB: 9.3 g/dL — ABNORMAL LOW (ref 13.4–17.5)
MCH: 29.5 pg (ref 26.0–32.0)
MCHC: 32.6 g/dL (ref 31.0–35.5)
MCV: 90.5 fL (ref 78.0–100.0)
MPV: 11 fL (ref 8.7–12.5)
PLATELETS: 367 10*3/uL (ref 150–400)
RBC: 3.15 10*6/uL — ABNORMAL LOW (ref 4.50–6.10)
RDW-CV: 16.3 % — ABNORMAL HIGH (ref 11.5–15.5)
WBC: 24.9 10*3/uL — ABNORMAL HIGH (ref 3.7–11.0)

## 2021-04-07 LAB — POC BLOOD GLUCOSE (RESULTS)
GLUCOSE, POC: 145 mg/dl — ABNORMAL HIGH (ref 70–105)
GLUCOSE, POC: 161 mg/dl — ABNORMAL HIGH (ref 70–105)
GLUCOSE, POC: 146 mg/dl — ABNORMAL HIGH (ref 70–105)
GLUCOSE, POC: 149 mg/dl — ABNORMAL HIGH (ref 70–105)

## 2021-04-07 LAB — PHOSPHORUS: PHOSPHORUS: 2.4 mg/dL (ref 2.3–4.0)

## 2021-04-07 LAB — PTT (PARTIAL THROMBOPLASTIN TIME)
APTT: 40.2 seconds — ABNORMAL HIGH (ref 24.2–37.5)
APTT: 37.3 seconds (ref 24.2–37.5)
APTT: 49.4 seconds — ABNORMAL HIGH (ref 24.2–37.5)

## 2021-04-07 LAB — MAGNESIUM: MAGNESIUM: 2.1 mg/dL (ref 1.8–2.6)

## 2021-04-07 MED ORDER — SODIUM CHLORIDE 0.9 % (FLUSH) INJECTION SYRINGE
10.0000 mL | INJECTION | Freq: Three times a day (TID) | INTRAMUSCULAR | Status: DC
Start: 2021-04-07 — End: 2021-05-03
  Administered 2021-04-07 – 2021-04-09 (×5): 0 mL
  Administered 2021-04-09 – 2021-04-12 (×10): 10 mL
  Administered 2021-04-12: 0 mL
  Administered 2021-04-13: 10 mL
  Administered 2021-04-13: 0 mL
  Administered 2021-04-13: 10 mL
  Administered 2021-04-14: 0 mL
  Administered 2021-04-14: 10 mL
  Administered 2021-04-14: 30 mL
  Administered 2021-04-15: 10 mL
  Administered 2021-04-15 (×2): 0 mL
  Administered 2021-04-16: 10 mL
  Administered 2021-04-16 (×2): 0 mL
  Administered 2021-04-17: 10 mL
  Administered 2021-04-17: 0 mL
  Administered 2021-04-18: 30 mL
  Administered 2021-04-18: 0 mL
  Administered 2021-04-19: 30 mL
  Administered 2021-04-19: 0 mL
  Administered 2021-04-19: 10 mL
  Administered 2021-04-20 – 2021-04-21 (×3): 0 mL
  Administered 2021-04-22: 30 mL
  Administered 2021-04-22 (×2): 0 mL
  Administered 2021-04-23: 20 mL
  Administered 2021-04-23: 30 mL
  Administered 2021-04-23 – 2021-04-24 (×3): 0 mL
  Administered 2021-04-24: 10 mL
  Administered 2021-04-25 – 2021-04-28 (×10): 0 mL
  Administered 2021-04-28: 10 mL
  Administered 2021-04-28: 0 mL
  Administered 2021-04-29: 10 mL
  Administered 2021-04-29 (×2): 0 mL
  Administered 2021-04-30 – 2021-05-01 (×4): 10 mL
  Administered 2021-05-01 (×2): 0 mL
  Administered 2021-05-02: 10 mL
  Administered 2021-05-02: 0 mL
  Administered 2021-05-02 – 2021-05-03 (×2): 10 mL

## 2021-04-07 MED ORDER — SODIUM CHLORIDE 0.9 % (FLUSH) INJECTION SYRINGE
20.0000 mL | INJECTION | INTRAMUSCULAR | Status: DC | PRN
Start: 2021-04-07 — End: 2021-05-03

## 2021-04-07 MED ORDER — METHOCARBAMOL 500 MG TABLET
500.0000 mg | ORAL_TABLET | Freq: Four times a day (QID) | ORAL | Status: DC
Start: 2021-04-07 — End: 2021-04-08
  Administered 2021-04-07 (×4): 500 mg via ORAL
  Filled 2021-04-07 (×4): qty 1

## 2021-04-07 MED ORDER — LIDOCAINE (PF) 10 MG/ML (1 %) INJECTION SOLUTION
0.5000 mL | Freq: Once | INTRAMUSCULAR | Status: AC
Start: 2021-04-07 — End: 2021-04-07
  Administered 2021-04-07: 0.5 mL via INTRADERMAL

## 2021-04-07 NOTE — Care Plan (Signed)
[facility]  Physical Therapy Progress Note      Patient Name: [name]  Date of Birth: Jun 18, 1945  Height:  175.3 cm ('5\' 9"'$ )  Weight:  77.6 kg (171 lb 1.2 oz)  Room/Bed: 28/A  Payor: MEDICARE / Plan: MEDICARE PART A AND B / Product Type: Medicare /     Assessment:     Pt tol tx fairly today. Pt report pain in abdomen that was consistent through tx. Pt educated on log roll bed mobility to help reduce abdomen pain during transfering. Pt transferred to chair w/ stand pivot transfer and MinA 2/2 pt being fearful of falling backwards. Pt had SPO2 flucuations during mobility on 6L O2. Cont to rec inpt rehab when medically ready for D/C.    Discharge Needs:   Equipment Recommendation: TBD    Discharge Disposition: inpatient rehabilitation facility    JUSTIFICATION OF DISCHARGE RECOMMENDATION   Based on current diagnosis, functional performance prior to admission, and current functional performance, this patient requires continued PT services in inpatient rehabilitation facility in order to achieve significant functional improvements in these deficit areas: aerobic capacity/endurance, gait, locomotion, and balance, ergonomics and body mechanics, muscle performance, posture, ROM (range of motion).      Plan:   Continue to follow patient according to established plan of care.  The risks/benefits of therapy have been discussed with the patient/caregiver and he/she is in agreement with the established plan of care.     Subjective & Objective:        04/07/21 0836   Therapist Pager   PT Assigned/ Pager # Steph [phone]   Rehab Session   Document Type therapy progress note (daily note)   Total PT Minutes: 14   Patient Effort good   Symptoms Noted During/After Treatment increased pain   Symptoms Noted Comment O2 dropped w/ mvmt. Pt c/o inc pain in abdomen.   General Information   Patient Profile Reviewed yes   Medical Lines PIV Line;Telemetry   Respiratory Status nasal cannula  (6L)    Existing Precautions/Restrictions fall precautions;full code   Mutuality/Individual Preferences   Individualized Care Needs OOB w/ Ax1 pivot to chair   Pre Treatment Status   Pre Treatment Patient Status Patient supine in bed;Call light within reach;Telephone within reach;Sitter select activated   Support Present Pre Treatment  None   Communication Pre Treatment  Nurse   Communication Pre Treatment Comment Nurse approved tx.   Vital Signs   Pre-Treatment Heart Rate (beats/min) 93   Post-treatment Heart Rate (beats/min) 99   Pre SpO2 (%) 93   O2 Delivery Pre Treatment supplemental O2   Post SpO2 (%) 89   O2 Delivery Post Treatment supplemental O2   Vitals Comment SPO2 decreased to 86 during EOB sitting and standing, but recovered to 90s w/ 2-3 min rest.   Pain Assessment   Pretreatment Pain Rating 6/10   Posttreatment Pain Rating 6/10   Pre/Posttreatment Pain Comment Pt reports inc pain in abdomen pre/post tx   Bed Mobility Assessment/Treatment   Bed Mobility, Assistive Device Head of Bed Elevated   Supine-Sit Independence contact guard assist   Impairments balance impaired;endurance;strength decreased   Comment Educated pt on log roll technique to move from supine to sitting w/o causing inc pain in abdomen. Pt able to complete log roll w/ CGA and cuing.   Transfer Assessment/Treatment   Sit-Stand Independence minimum assist (75% patient effort)   Stand-Sit Independence minimum assist (75% patient effort)   Sit-Stand-Sit,  Assist Device side by side   Bed-Chair Independence minimum assist (75% patient effort)  (stand pivot transfer)   Transfer Comment Pt able to stand fully upright today in front of bedside chair w/ MinA for safety and balance. SPO2 dropped to 86% while standing.   Balance Skill Training   Comment CGA   Sitting Balance: Static good balance   Sitting, Dynamic (Balance) fair + balance   Sit-to-Stand Balance fair balance   Standing Balance: Static fair - balance   Standing Balance: Dynamic fair -  balance   Post Treatment Status   Post Treatment Patient Status Patient sitting in bedside chair or w/c;Call light within reach;Telephone within reach;Sitter select activated   Support Present Post Treatment  None   Basic Mobility Am-PAC/6Clicks Score (APPROVED PT Staff, [facility], [facility] Nursing ONLY, [place], [facility], and [facility])   Turning in bed without bedrails 3   Lying on back to sitting on edge of flat bed 3   Moving to and from a bed to a chair 2   Standing up from chair 2   Walk in room 2   Climbing 3-5 steps with railing 1   6 Clicks Raw Score total 13   Standardized (t-scale) score 33.99   CMS 0-100% Score 57.65   CMS Modifier CK   Patient Mobility Goal (Rough and Ready) 4- Move to chair 3X/day   Exercise/Activity Level Performed 4- Transferred to chair/commode   Physical Therapy Clinical Impression   Assessment Pt tol tx fairly today. Pt report pain in abdomen that was consistent through tx. Pt educated on log roll bed mobility to help reduce abdomen pain during transfering. Pt transferred to chair w/ stand pivot transfer and MinA 2/2 pt being fearful of falling backwards. Pt had SPO2 flucuations during mobility on 6L O2. Cont to rec inpt rehab when medically ready for D/C.   Anticipated Equipment Needs at Discharge (PT) TBD   Anticipated Discharge Disposition inpatient rehabilitation facility         Therapist:   Shella Spearing, PTA   Pager #: [phone]

## 2021-04-07 NOTE — Progress Notes (Signed)
[name]  Surgical Oncology  Progress Note      [name], [name], 76 y.o. male  Date of Birth:  Jun 22, 1945  Date of Admission:  04/03/2021  Date of service: 04/07/2021    Assessment:  This is a 76 y.o. 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 He underwent massive transfusion protocol and ex lap on 04/03/21 at an outside facility with ligation of a bleeding vessel in the liver bed. Patient was subsequently transferred to [facility] postoperaively, intubated. Patient was subsequently extubated later that day on 3/4. Patient remains tachycardic today without further transfusion requirements.       Plan/Recommendations:  - Diet: NPO, sips and chips  - Aspiration precautions - HOB 30  - JP with 20 cc ss output   Amylase level > 6,554 04/06/21  - PE   - Continue low intensity, no bolus heparin drip   - Encouraged IS and ambulation   - Aggressive plum toilet   - Wean O2   - OOB TID  - PT/OT ordered    Subjective:   Tolerated ice chips and sips.     Objective  Filed Vitals:    04/06/21 2330 04/06/21 2333 04/07/21 0300 04/07/21 0715   BP: (!) 140/94  118/73 (!) 146/89   Pulse: 98  92 93   Resp: '16  16 20   '$ Temp: 36.5 C (97.7 F) 36.5 C (97.7 F) 36.6 C (97.8 F) 36.8 C (98.3 F)   SpO2: 93%  94% 94%     Physical Exam:   GEN:  AOx4, resting in bed, no acute distress  PULM:   Lung sounds clear to auscultation bilaterally.  Normal respiratory effort.   CV:  Sinus tach  ABD:   Abdomen soft, distended, TTP RUQ. JP in place in RUQ with serous fluid. Incision C/D/I.   MS: Atraumatic.  Distal pulses intact.  Normal strength and range of motion of all extremities.    NEURO:   Alert and oriented to person, place and time.  Cranial nerves grossly intact.    Vascular:  All pulses palpable and equal bilaterally  Integumentary:  Pink, warm, and  dry  PSYCHOSOCIAL: Pleasant.  Normal affect.     Labs  Results for orders placed or performed during the [name] encounter of 04/03/21 (from the past 24 hour(s))   PTT (PARTIAL THROMBOPLASTIN TIME)   Result Value Ref Range    APTT 33.2 24.2 - 37.5 seconds    Narrative    Therapeutic range for unfractionated heparin is 60-100 seconds.   PTT (PARTIAL THROMBOPLASTIN TIME)   Result Value Ref Range    APTT 46.5 (H) 24.2 - 37.5 seconds    Narrative    Therapeutic range for unfractionated heparin is 60-100 seconds.   PTT (PARTIAL THROMBOPLASTIN TIME)   Result Value Ref Range    APTT 54.8 (H) 24.2 - 37.5 seconds    Narrative    Therapeutic range for unfractionated heparin is 60-100 seconds.   BASIC METABOLIC PANEL   Result Value Ref Range    SODIUM 136 136 - 145 mmol/L    POTASSIUM 4.2 3.5 - 5.1 mmol/L    CHLORIDE 104 96 - 111 mmol/L    CO2 TOTAL 24 23 - 31 mmol/L    ANION GAP 8 4 - 13 mmol/L  CALCIUM 8.7 (L) 8.8 - 10.2 mg/dL    GLUCOSE 146 (H) 65 - 125 mg/dL    BUN 20 8 - 25 mg/dL    CREATININE 0.82 0.75 - 1.35 mg/dL    BUN/CREA RATIO 24 (H) 6 - 22    ESTIMATED GFR >90 >=60 mL/min/BSA   MAGNESIUM   Result Value Ref Range    MAGNESIUM 2.1 1.8 - 2.6 mg/dL   CBC   Result Value Ref Range    WBC 24.9 (H) 3.7 - 11.0 x10^3/uL    RBC 3.15 (L) 4.50 - 6.10 x10^6/uL    HGB 9.3 (L) 13.4 - 17.5 g/dL    HCT 28.5 (L) 38.9 - 52.0 %    MCV 90.5 78.0 - 100.0 fL    MCH 29.5 26.0 - 32.0 pg    MCHC 32.6 31.0 - 35.5 g/dL    RDW-CV 16.3 (H) 11.5 - 15.5 %    PLATELETS 367 150 - 400 x10^3/uL    MPV 11.0 8.7 - 12.5 fL   PHOSPHORUS   Result Value Ref Range    PHOSPHORUS 2.4 2.3 - 4.0 mg/dL   PTT (PARTIAL THROMBOPLASTIN TIME)   Result Value Ref Range    APTT 40.2 (H) 24.2 - 37.5 seconds    Narrative    Therapeutic range for unfractionated heparin is 60-100 seconds.   POC BLOOD GLUCOSE (RESULTS)   Result Value Ref Range    GLUCOSE, POC 160 (H) 70 - 105 mg/dl   POC BLOOD GLUCOSE (RESULTS)   Result Value Ref Range    GLUCOSE, POC 159 (H) 70 - 105  mg/dl   POC BLOOD GLUCOSE (RESULTS)   Result Value Ref Range    GLUCOSE, POC 154 (H) 70 - 105 mg/dl   POC BLOOD GLUCOSE (RESULTS)   Result Value Ref Range    GLUCOSE, POC 145 (H) 70 - 105 mg/dl       I/O:  Date 04/06/21 0700 - 04/07/21 0659 04/07/21 0700 - 04/08/21 0659   Shift 0700-1459 1500-2259 2300-0659 24 Hour Total 0700-1459 1500-2259 2300-0659 24 Hour Total   INTAKE   P.O. 0 0  0         Oral 0 0  0       Shift Total(mL/kg) 0(0) 0(0)  0(0)       OUTPUT   Urine(mL/kg/hr) 550(0.81) 250(0.37) 925(1.49) 1725(0.93)         Urine (Voided) 550 [phone]       Drains  20  20         JP Drain Output ([employer]att Drain Right;Upper Abdomen)  20  20       Stool             Stool Occurrence 0 x 0 x  0 x       Shift Total(mL/kg) 550(6.45) 270(3.17) 925(11.92) 1745(22.49)       Weight (kg) 85.3 85.3 77.6 77.6 77.6 77.6 77.6 77.6       Radiology  Results for orders placed or performed during the [name] encounter of 04/03/21 (from the past 72 hour(s))   XR AP MOBILE CHEST     Status: None    Narrative    [name]  Male, 76 years old.    XR AP MOBILE CHEST performed on 04/06/2021 10:41 AM.    REASON FOR EXAM:  Cough    TECHNIQUE: 1 views/1 images submitted for interpretation.    COMPARISON:  04/03/2021.    FINDINGS: The  endotracheal and enteric tubes have been removed. A surgical drain again overlies the left upper quadrant. Stable cardiac silhouette. Stable small left pleural effusion with adjacent atelectasis. No pneumothorax.      Impression    Stable cardiomegaly, small left pleural effusion and adjacent atelectasis.             Current Medications:  Current Facility-Administered Medications   Medication Dose Route Frequency   . acetaminophen (TYLENOL) tablet  650 mg Oral Q4H   . atorvastatin (LIPITOR) tablet  40 mg Oral Daily with Breakfast   . D5W 250 mL flush bag   Intravenous Q15 Min PRN   . docusate sodium (COLACE) capsule  100 mg Oral 2x/day   . electrolyte-A (PLASMALYTE-A) premix infusion   Intravenous  Continuous   . elvitegravir-cobicistat-emtricitrabine-tenofovir (GENVOYA) 150-150-200-10 mg per tablet  1 Tablet Oral Daily   . gabapentin (NEURONTIN) capsule  300 mg Oral 3x/day   . heparin 25,000 units in NS 250 mL infusion  12 Units/kg/hr (Adjusted) Intravenous Continuous   . HYDROmorphone (DILAUDID) 1 mg/mL injection  0.2 mg Intravenous Q3H PRN   . levothyroxine (SYNTHROID) tablet  88 mcg Oral QAM   . methocarbamol (ROBAXIN) tablet  500 mg Oral 4x/day   . NS 250 mL flush bag   Intravenous Q15 Min PRN   . NS flush syringe  2-6 mL Intracatheter Q8HRS   . NS flush syringe  2-6 mL Intracatheter Q1 MIN PRN   . ondansetron (ZOFRAN) 2 mg/mL injection  4 mg Intravenous Q6H PRN   . oxyCODONE (ROXICODONE) immediate release tablet  5 mg Oral Q4H PRN   . oxyCODONE (ROXICODONE) immediate releaste tablet  10 mg Oral Q4H PRN   . pantoprazole (PROTONIX) delayed release tablet  40 mg Oral Daily before Breakfast   . scopolamine 1 mg over 3 days transdermal patch  1 Patch Transdermal Q72H   . sennosides-docusate sodium (SENOKOT-S) 8.6-'50mg'$  per tablet  1 Tablet Oral 2x/day   . SSIP insulin R human (HUMULIN R) 100 units/mL injection  0-12 Units Subcutaneous Q6H PRN       Allene Pyo, PA-C  04/07/2021, 08:45

## 2021-04-07 NOTE — Care Plan (Signed)
[place]  Occupational Therapy Initial Evaluation    Patient Name: [name]  Date of Birth: 31-Dec-1945  Height: Height: 175.3 cm ('5\' 9"'$ )  Weight: Weight: 77.6 kg (171 lb 1.2 oz)  Room/Bed: 28/A  Payor: MEDICARE / Plan: MEDICARE PART A AND B / Product Type: Medicare /     Assessment:   Pt tolerated Occupational therapy session fairly. [name] demonstrated impaired endurance and activity tolerance on 6L O2 via NC with desaturation with minimal activity. Pt limited in ADL performance with assist x 1 and cues for bed mobility and stand pivot transfer. Pt will greatly benefit from continued skilled OT services to maximize independence and safety. Recommend Acute rehab when pt medically appropriate for discharge.      Discharge Needs:   Equipment Recommendation: to be determined      Discharge Disposition: inpatient rehabilitation facility    JUSTIFICATION OF DISCHARGE RECOMMENDATION   Based on current diagnosis, functional performance prior to admission, and current functional performance, this patient requires continued OT services in inpatient rehabilitation facility  in order to achieve significant functional improvements.    Plan:   Current Intervention: ADL retraining, bed mobility training, endurance training, strengthening, therapeutic exercise, transfer training, ROM (range of motion), balance training    To provide Occupational therapy services 1x/day, minimum of 2x/week, until discharge, until goals are met.       The risks/benefits of therapy have been discussed with the patient/caregiver and he/she is in agreement with the established plan of care.       Subjective & Objective        04/07/21 0845   Therapist Pager   OT Assigned/ Pager # Resean Brander [phone]   Rehab Session   Document Type evaluation   Total OT Minutes: 14   Patient Effort good   Symptoms Noted During/After Treatment increased pain   General Information   Patient Profile Reviewed yes   Pertinent  History of Current Functional Problem "76 y.o. 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 He underwent massive transfusion protocol and ex lap on 04/03/21 with ligation of a bleeding vessel"   Medical Lines PIV Line;Telemetry   Respiratory Status nasal cannula  (6L O2 via NC)   Existing Precautions/Restrictions full code;fall precautions   Pre Treatment Status   Pre Treatment Patient Status Patient supine in bed;Call light within reach;Telephone within reach;Sitter select activated;Nurse approved session   Support Present Pre Treatment  None   Communication Pre Treatment  Nurse   Mutuality/Individual Preferences   Individualized Care Needs OOB with assist x 1-2 stand pivot transfer to chair   Living Environment   Lives With spouse   Living Arrangements house   Home Assessment: No Problems Identified   Home Accessibility bed and bath on same level;stairs to enter home   Home Main Entrance   Number of Stairs, Main Entrance two   Stair Railings, Main Entrance railing on left side (ascending)   Functional Level Prior   Ambulation 0 - independent   Transferring 0 - independent   Toileting 0 - independent   Bathing 0 - independent   Dressing 0 - independent   Eating 0 - independent   Prior Functional Level Comment Pt is independent and active at baseline with all ADLs/IADLs.   Self-Care   Current Activity Tolerance  moderate   Vital Signs   Pre-Treatment Heart Rate (beats/min) 93   Post-treatment Heart Rate (beats/min) 99   Pre SpO2 (%) 93   O2 Delivery Pre Treatment supplemental O2   Post SpO2 (%) 89   O2 Delivery Post Treatment supplemental O2   Vitals Comment Pt desaturated to lowest valuse of 86%, recovered with 1-2 minute seated rest break back to low 90s.   Pain Assessment   Pre/Posttreatment Pain Comment abdomen pain   Pretreatment Pain Rating  6/10   Posttreatment Pain Rating 6/10   Coping/Psychosocial Response Interventions   Plan Of Care Reviewed With patient   Cognitive Assessment/Interventions   Behavior/Mood Observations behavior appropriate to situation, WNL/WFL;alert;cooperative   Orientation Status oriented x 4   Attention WNL/WFL   Follows Commands WNL   RUE Assessment   RUE Assessment X- Exceptions   RUE ROM WFL   RUE Strength grossly 4-/5   LUE Assessment   LUE Assessment X-Exceptions   LUE ROM WFL   LUE Strength grossly 4-/5   Mobility Assessment/Training   Mobility Comment unable secondary to desaturation   Bed Mobility Assessment/Treatment   Bed Mobility, Assistive Device Head of Bed Elevated   Supine-Sit Independence contact guard assist;verbal cues required   Impairments balance impaired;endurance;strength decreased   Comment pt able to complete supine to sit with logroll technique and CGA, good followthrough of commands   Transfer Assessment/Treatment   Sit-Stand Independence minimum assist (75% patient effort);verbal cues required   Stand-Sit Independence minimum assist (75% patient effort);verbal cues required   Sit-Stand-Sit, Assist Device side by side   Bed-Chair Independence minimum assist (75% patient effort);verbal cues required   Bed-Chair-Bed Assist Device side by side   Transfer Impairments balance impaired;endurance;strength decreased;postural control impaired;pain   Transfer Comment Pt completed stand pivot transfer with MIN A and cues. Able to tolerate standing ~1 minute, seated rest break required following.   ADL Assessment/Intervention   ADL Comments Pt limited in all ADL performance with impaired endurance and activity tolerance with desaturation with minimal activity, will continue to assess.   Grooming Assessment/Training   Position sitting   Independence Level set up required   Comment pt washed hands, face and brushed teeth seated in bedside chair   Balance Skill Training   Comment CGA   Sitting Balance: Static good  balance   Sitting, Dynamic (Balance) fair + balance   Sit-to-Stand Balance fair balance   Standing Balance: Static fair - balance   Standing Balance: Dynamic fair - balance   Post Treatment Status   Post Treatment Patient Status Patient sitting in bedside chair or w/c;Call light within reach;Telephone within reach;Sitter select activated   Support Present Press photographer Nurse   Care Plan Goals   OT Rehab Goals Occupational Therapy Goal;Bed Mobility Goal;LB Dressing Goal;Toileting Goal;Transfer Training Goal;Grooming Goal   Occupational Therapy Goals   OT Goal, Date Established 04/07/21   OT Goal, Time to Achieve by discharge   OT Goal, Activity Type increase endurance fair+ or greater to facilitate improved ADL performance   Bed Mobility Goal   Bed Mobility Goal, Date Established 04/07/21   Bed Mobility Goal, Time to Achieve by discharge   Bed Mobility Goal, Activity Type all bed mobility activities   Bed Mobility Goal, Independence Level modified independence   Grooming Goal   Grooming Goal, Date Established 04/07/21   Grooming Goal, Time to Achieve by discharge   Grooming Goal, Activity Type all grooming tasks  Grooming Goal, Independence  independent   LB Dressing Goal   LB Dressing Goal, Date Established 04/07/21   LB Dressing Goal, Time to Achieve by discharge   LB Dressing Goal, Activity Type all lower body dressing tasks   LB Dressing Goal, Independence Level modified independence   LB Dressing Goal, Adaptive Equipment   (AE as needed)   Toileting Goal   Toileting Goal, Date Established 04/07/21   Toileting Goal, Time to Achieve by discharge   Toileting Goal, Activity Type all toileting tasks   Toileting Goal, Independence Level independent   Transfer Training Goal   Transfer Training Goal, Date Established 04/07/21   Transfer Training Goal, Time to Achieve by discharge   Transfer Training Goal, Activity Type all transfers   Transfer Training Goal, Independence Level  modified independence   Transfer Training Goal, Assist Device least restrictrictive assistive device   Planned Therapy Interventions, OT Eval   Planned Therapy Interventions ADL retraining;bed mobility training;endurance training;strengthening;therapeutic exercise;transfer training;ROM (range of motion);balance training   Clinical Impression   Functional Level at Time of Session Pt tolerated Occupational therapy session fairly. Mr.Marks demonstrated impaired endurance and activity tolerance on 6L O2 via NC with desaturation with minimal activity. Pt limited in ADL performance with assist x 1 and cues for bed mobility and stand pivot transfer. Pt will greatly benefit from continued skilled OT services to maximize independence and safety. Recommend Acute rehab when pt medically appropriate for discharge.   Criteria for Skilled Therapeutic Interventions Met (OT) yes;skilled treatment is necessary   Rehab Potential good   Therapy Frequency 1x/day;minimum of 2x/week   Predicted Duration of Therapy until discharge;until goals are met   Anticipated Equipment Needs at Discharge to be determined   Anticipated Discharge Disposition inpatient rehabilitation facility   Evaluation Complexity Justification   Occupational Profile Review Expanded review   Performance Deficits 5+ deficits;Endurance;Balance;Mobility;Strength;Range of motion;Pain;Coordination;Safety awareness   Clinical Decision Making Moderate analytic complexity   Evaluation Complexity Moderate       Therapist:   Keane Scrape, OT   Pager #: [phone]

## 2021-04-07 NOTE — Care Plan (Signed)
Pt. Has been tolerating his cough assist throughout the day. Pt. States that the therapy has been successful. Pt. Spo2 are in the low 90's.  On a NC. Will continue to monitor pt.

## 2021-04-08 ENCOUNTER — Inpatient Hospital Stay (HOSPITAL_COMMUNITY): Payer: 59

## 2021-04-08 DIAGNOSIS — Z9081 Acquired absence of spleen: Secondary | ICD-10-CM

## 2021-04-08 DIAGNOSIS — J9811 Atelectasis: Secondary | ICD-10-CM

## 2021-04-08 DIAGNOSIS — I2694 Multiple subsegmental pulmonary emboli without acute cor pulmonale: Secondary | ICD-10-CM

## 2021-04-08 DIAGNOSIS — R188 Other ascites: Secondary | ICD-10-CM

## 2021-04-08 DIAGNOSIS — R935 Abnormal findings on diagnostic imaging of other abdominal regions, including retroperitoneum: Secondary | ICD-10-CM

## 2021-04-08 DIAGNOSIS — J90 Pleural effusion, not elsewhere classified: Secondary | ICD-10-CM

## 2021-04-08 DIAGNOSIS — Z90411 Acquired partial absence of pancreas: Secondary | ICD-10-CM

## 2021-04-08 LAB — URINALYSIS, MACROSCOPIC
BILIRUBIN: NEGATIVE mg/dL
BLOOD: NEGATIVE mg/dL
COLOR: NORMAL
GLUCOSE: NEGATIVE mg/dL
KETONES: NEGATIVE mg/dL
LEUKOCYTES: NEGATIVE WBCs/uL
NITRITE: NEGATIVE
PH: 7.5 (ref 5.0–8.0)
PROTEIN: NEGATIVE mg/dL
SPECIFIC GRAVITY: 1.017 (ref 1.005–1.030)
UROBILINOGEN: NEGATIVE mg/dL

## 2021-04-08 LAB — URINALYSIS, MICROSCOPIC
HYALINE CASTS: 1 /lpf (ref <4.0)
RBCS: 1 /hpf (ref <6.0)
WBCS: 1 /hpf (ref <4.0)

## 2021-04-08 LAB — MANUAL DIFF AND MORPHOLOGY-SYSMEX
BASOPHIL #: <0.1 10*3/uL (ref <=0.20)
BASOPHIL %: 0 %
EOSINOPHIL #: <0.1 10*3/uL (ref <=0.50)
EOSINOPHIL %: 0 %
LYMPHOCYTE #: 0.71 10*3/uL — ABNORMAL LOW (ref 1.00–4.80)
LYMPHOCYTE %: 2 %
METAMYELOCYTE %: 0 %
MONOCYTE #: 1.07 10*3/uL (ref 0.20–1.10)
MONOCYTE %: 3 %
NEUTROPHIL #: 33.37 10*3/uL — ABNORMAL HIGH (ref 1.50–7.70)
NEUTROPHIL %: 94 %
NRBC FROM MANUAL DIFF: 24 per 100 WBC

## 2021-04-08 LAB — POC BLOOD GLUCOSE (RESULTS)
GLUCOSE, POC: 114 mg/dl — ABNORMAL HIGH (ref 70–105)
GLUCOSE, POC: 163 mg/dl — ABNORMAL HIGH (ref 70–105)
GLUCOSE, POC: 125 mg/dl — ABNORMAL HIGH (ref 70–105)
GLUCOSE, POC: 122 mg/dl — ABNORMAL HIGH (ref 70–105)

## 2021-04-08 LAB — CBC WITH DIFF
HCT: 29.2 % — ABNORMAL LOW (ref 38.9–52.0)
HGB: 9.7 g/dL — ABNORMAL LOW (ref 13.4–17.5)
MCH: 30 pg (ref 26.0–32.0)
MCHC: 33.2 g/dL (ref 31.0–35.5)
MCV: 90.4 fL (ref 78.0–100.0)
MPV: 11.4 fL (ref 8.7–12.5)
PLATELETS: 392 10*3/uL (ref 150–400)
RBC: 3.23 10*6/uL — ABNORMAL LOW (ref 4.50–6.10)
RDW-CV: 15.9 % — ABNORMAL HIGH (ref 11.5–15.5)
WBC: 35.5 10*3/uL — ABNORMAL HIGH (ref 3.7–11.0)

## 2021-04-08 LAB — BASIC METABOLIC PANEL
ANION GAP: 6 mmol/L (ref 4–13)
BUN/CREA RATIO: 23 — ABNORMAL HIGH (ref 6–22)
BUN: 16 mg/dL (ref 8–25)
CALCIUM: 8.7 mg/dL — ABNORMAL LOW (ref 8.8–10.2)
CHLORIDE: 101 mmol/L (ref 96–111)
CO2 TOTAL: 27 mmol/L (ref 23–31)
CREATININE: 0.69 mg/dL — ABNORMAL LOW (ref 0.75–1.35)
ESTIMATED GFR: >90 mL/min/BSA (ref >=60)
GLUCOSE: 122 mg/dL (ref 65–125)
POTASSIUM: 4.5 mmol/L (ref 3.5–5.1)
SODIUM: 134 mmol/L — ABNORMAL LOW (ref 136–145)

## 2021-04-08 LAB — MRSA SCREEN, PCR, RAPID: MRSA COLONIZATION SCREEN: POSITIVE — AB

## 2021-04-08 LAB — PTT (PARTIAL THROMBOPLASTIN TIME)
APTT: 54.8 seconds — ABNORMAL HIGH (ref 24.2–37.5)
APTT: 55.5 seconds — ABNORMAL HIGH (ref 24.2–37.5)
APTT: 58.2 seconds — ABNORMAL HIGH (ref 24.2–37.5)
APTT: 98.1 seconds — ABNORMAL HIGH (ref 24.2–37.5)

## 2021-04-08 LAB — AMYLASE BODY FLUID: AMYLASE BODY FLUID: >6554 U/L

## 2021-04-08 LAB — MAGNESIUM: MAGNESIUM: 2 mg/dL (ref 1.8–2.6)

## 2021-04-08 LAB — PHOSPHORUS: PHOSPHORUS: 2.3 mg/dL (ref 2.3–4.0)

## 2021-04-08 MED ORDER — CEFEPIME 2 GRAM SOLUTION FOR INJECTION
2.0000 g | INTRAMUSCULAR | Status: AC
Start: 2021-04-08 — End: 2021-04-08
  Administered 2021-04-08: 0 g via INTRAVENOUS
  Administered 2021-04-08: 2 g via INTRAVENOUS
  Filled 2021-04-08: qty 12.5

## 2021-04-08 MED ORDER — DIATRIZOATE MEGLUMINE-DIATRIZOATE SODIUM 66 %-10 % ORAL SOLUTION
8.0000 mL | ORAL | Status: DC
Start: 2021-04-08 — End: 2021-04-12

## 2021-04-08 MED ORDER — BENZONATATE 100 MG CAPSULE
100.0000 mg | ORAL_CAPSULE | Freq: Three times a day (TID) | ORAL | Status: DC | PRN
Start: 2021-04-08 — End: 2021-05-28
  Administered 2021-05-24 – 2021-05-27 (×8): 100 mg via ORAL
  Filled 2021-04-08 (×7): qty 1

## 2021-04-08 MED ORDER — SODIUM CHLORIDE 0.9 % INTRAVENOUS PIGGYBACK
2.0000 g | INJECTION | Freq: Two times a day (BID) | INTRAVENOUS | Status: DC
Start: 2021-04-08 — End: 2021-04-10
  Administered 2021-04-08: 2 g via INTRAVENOUS
  Administered 2021-04-08 – 2021-04-09 (×2): 0 g via INTRAVENOUS
  Administered 2021-04-09 (×2): 2 g via INTRAVENOUS
  Administered 2021-04-09 – 2021-04-10 (×2): 0 g via INTRAVENOUS
  Administered 2021-04-10: 2 g via INTRAVENOUS
  Filled 2021-04-08 (×4): qty 12.5

## 2021-04-08 MED ORDER — METHOCARBAMOL 500 MG TABLET
1000.0000 mg | ORAL_TABLET | Freq: Four times a day (QID) | ORAL | Status: DC
Start: 2021-04-08 — End: 2021-04-10
  Administered 2021-04-08 – 2021-04-09 (×5): 1000 mg via ORAL
  Administered 2021-04-09: 0 mg via ORAL
  Administered 2021-04-09: 1000 mg via ORAL
  Administered 2021-04-09: 0 mg via ORAL
  Administered 2021-04-10: 1000 mg via ORAL
  Filled 2021-04-08 (×8): qty 2

## 2021-04-08 MED ORDER — VANCOMYCIN IV - PHARMACIST TO DOSE PER PROTOCOL - NO EXCLUSION CRITERIA
Freq: Every day | Status: DC | PRN
Start: 2021-04-08 — End: 2021-04-12

## 2021-04-08 MED ORDER — IOPAMIDOL 370 MG IODINE/ML (76 %) INTRAVENOUS SOLUTION
98.0000 mL | INTRAVENOUS | Status: AC
Start: 2021-04-08 — End: 2021-04-08
  Administered 2021-04-08: 98 mL via INTRAVENOUS

## 2021-04-08 MED ORDER — VANCOMYCIN 1 GRAM/200 ML IN DEXTROSE 5 % INTRAVENOUS PIGGYBACK
15.0000 mg/kg | INJECTION | Freq: Two times a day (BID) | INTRAVENOUS | Status: DC
Start: 2021-04-08 — End: 2021-04-12
  Administered 2021-04-08: 1000 mg via INTRAVENOUS
  Administered 2021-04-08 (×2): 0 mg via INTRAVENOUS
  Administered 2021-04-08 – 2021-04-09 (×3): 1000 mg via INTRAVENOUS
  Administered 2021-04-09: 0 mg via INTRAVENOUS
  Administered 2021-04-10: 1000 mg via INTRAVENOUS
  Administered 2021-04-10: 0 mg via INTRAVENOUS
  Administered 2021-04-10 (×2): 1000 mg via INTRAVENOUS
  Administered 2021-04-10: 0 mg via INTRAVENOUS
  Administered 2021-04-10: 1000 mg via INTRAVENOUS
  Administered 2021-04-10: 0 mg via INTRAVENOUS
  Administered 2021-04-11 (×2): 1000 mg via INTRAVENOUS
  Administered 2021-04-11 (×2): 0 mg via INTRAVENOUS
  Administered 2021-04-12: 1000 mg via INTRAVENOUS
  Administered 2021-04-12 – 2021-04-13 (×3): 0 mg via INTRAVENOUS
  Filled 2021-04-08 (×9): qty 200

## 2021-04-08 NOTE — Care Plan (Signed)
04/08/21 0948   Therapist Pager   OT Assigned/ Pager # Shaley Leavens [phone]   Rehab Session   Document Type rehab contact note   Total OT Minutes: 0   Clinical Impression   Criteria for Skilled Therapeutic Interventions Met (OT) yes;skilled treatment is necessary   Daily Activity AM-PAC/6-clicks Score   Patient Mobility Barrier Patient declined treatment  ("pt asking to hold session to allow time for consumption of CT contrast. Will follow up tomorrow.")

## 2021-04-08 NOTE — Nurses Notes (Signed)
Patient amylase body fluid lab collected and sent to chemistry lab for 0000 order. Per chemistry lab, specimen "too dark" to obtain reading. Service paged and made aware. No new orders at this time.    Cleaster Corin, RN  04/08/2021, 05:17

## 2021-04-08 NOTE — Pharmacy (Signed)
[facility]  Therapeutic Drug Monitoring: Vancomycin  04/08/2021      Patient name: [name], [name]  Date of Birth:  Aug 14, 1945    Actual Weight:  Weight: 77.6 kg (171 lb 1.2 oz) (04/07/21 0612)     BMI:  BMI (Calculated): 25.32 (04/07/21 0612)      Date RPh Current regimen (including mg/kg) Indication &  Organism AUC or trough based dosing Target Levels^ SCr (mg/dL) CrCl* (mL/min) Infectious Laboratory Markers (as applicable)   Measured level(s)   (mcg/mL) Calculated AUC (if AUC based monitoring) Plan & predicted AUC/trough if initial dosing (including when levels are due) Comments   3/9 Hamda Klutts Initiating Intra-abdominal AUC 400-600 0.69 92.5 WBC: 35.5     - Initiate vancomycin 1g q12h (~13 mg/kg)   - predicted AUC 453  - levels in 48 hrs if continued  - MRSA swab ordered in case of concern for pneumonia                                                                                                  ^Target levels depends on dosing and monitoring method, AUC vs. trough based. For AUC based dosing units are mg*h/L. For trough based dosing units are mcg/mL.     *Creatinine clearance is estimated by using the Cockcroft-Gault equation for adult patients and the Carol Ada for pediatric patients.    The decision to discontinue vancomycin therapy will be determined by the primary service.  Please contact the pharmacist with any questions regarding this patient's medication regimen.

## 2021-04-08 NOTE — Progress Notes (Signed)
[facility]  Surgical Oncology  Progress Note      [name], [name], 77 y.o. male  Date of Birth:  December 30, 1945  Date of Admission:  04/03/2021  Date of service: 04/08/2021    Assessment:  This is a 76 y.o. 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 He underwent massive transfusion protocol and ex lap on 04/03/21 at an outside facility with ligation of a bleeding vessel in the liver bed. Patient was subsequently transferred to [facility] postoperaively, intubated. Patient was subsequently extubated later that day on 3/4. Patient remains tachycardic today without further transfusion requirements.       Plan/Recommendations:  - Diet: Fulls  - Aspiration precautions - HOB 30  - JP with 35 cc ss output   Amylase level > 6,554 04/06/21, repeat today  -Leukocytosis   -CT C/A/P (Chest PE protocol)   - Starting Vanc, Cefepime   - PE   - Continue low intensity, no bolus heparin drip   - Encouraged IS and ambulation   - Aggressive plum toilet   - Wean O2     - OOB TID  - PT/OT ordered    Subjective:   Mildly worsening abdominal pain. Continues to have productive cough.     Objective  Filed Vitals:    04/07/21 1915 04/07/21 2345 04/08/21 0100 04/08/21 0345   BP: 134/86 139/85  (!) 146/86   Pulse: 96 95  92   Resp: '18 18 18 18   '$ Temp: 36.3 C (97.4 F) 36.5 C (97.7 F)  36.4 C (97.6 F)   SpO2: 94% 92%  94%     Physical Exam:   GEN:  AOx4, resting in bed, no acute distress  PULM: Normal respiratory effort.   CV:  Sinus tach  ABD:   Abdomen soft, distended, TTP RUQ. JP in place in RUQ with serous fluid. Incision C/D/I.   MS: Atraumatic.  Distal pulses intact.  Normal strength and range of motion of all extremities.    NEURO:   Alert and oriented to person, place and time.  Cranial nerves grossly intact.    Vascular:  All pulses palpable and  equal bilaterally  Integumentary:  Pink, warm, and dry  PSYCHOSOCIAL: Pleasant.  Normal affect.     Labs  Results for orders placed or performed during the hospital encounter of 04/03/21 (from the past 24 hour(s))   PTT (PARTIAL THROMBOPLASTIN TIME)   Result Value Ref Range    APTT 37.3 24.2 - 37.5 seconds    Narrative    Therapeutic range for unfractionated heparin is 60-100 seconds.   PTT (PARTIAL THROMBOPLASTIN TIME)   Result Value Ref Range    APTT 49.4 (H) 24.2 - 37.5 seconds    Narrative    Therapeutic range for unfractionated heparin is 60-100 seconds.   BASIC METABOLIC PANEL   Result Value Ref Range    SODIUM 134 (L) 136 - 145 mmol/L    POTASSIUM 4.5 3.5 - 5.1 mmol/L    CHLORIDE 101 96 - 111 mmol/L    CO2 TOTAL 27 23 - 31 mmol/L    ANION GAP 6 4 - 13 mmol/L    CALCIUM 8.7 (L) 8.8 - 10.2 mg/dL    GLUCOSE 122 65 - 125 mg/dL    BUN 16 8 - 25 mg/dL  CREATININE 0.69 (L) 0.75 - 1.35 mg/dL    BUN/CREA RATIO 23 (H) 6 - 22    ESTIMATED GFR >90 >=60 mL/min/BSA   CBC/DIFF    Narrative    The following orders were created for panel order CBC/DIFF.  Procedure                               Abnormality         Status                     ---------                               -----------         ------                     CBC WITH GPQD[[id number]]                Abnormal            Final result               MANUAL DIFF AND MORPHOLO.Marland KitchenMarland Kitchen[[id number]]  Abnormal            Final result                 Please view results for these tests on the individual orders.   MAGNESIUM   Result Value Ref Range    MAGNESIUM 2.0 1.8 - 2.6 mg/dL   PHOSPHORUS   Result Value Ref Range    PHOSPHORUS 2.3 2.3 - 4.0 mg/dL   PTT (PARTIAL THROMBOPLASTIN TIME)   Result Value Ref Range    APTT 54.8 (H) 24.2 - 37.5 seconds    Narrative    Therapeutic range for unfractionated heparin is 60-100 seconds.   CBC WITH DIFF   Result Value Ref Range    WBC 35.5 (H) 3.7 - 11.0 x10^3/uL    RBC 3.23 (L) 4.50 - 6.10 x10^6/uL    HGB 9.7 (L) 13.4 - 17.5 g/dL    HCT  29.2 (L) 38.9 - 52.0 %    MCV 90.4 78.0 - 100.0 fL    MCH 30.0 26.0 - 32.0 pg    MCHC 33.2 31.0 - 35.5 g/dL    RDW-CV 15.9 (H) 11.5 - 15.5 %    PLATELETS 392 150 - 400 x10^3/uL    MPV 11.4 8.7 - 12.5 fL   MANUAL DIFF AND MORPHOLOGY-SYSMEX   Result Value Ref Range    NEUTROPHIL % 94 %    LYMPHOCYTE %  2 %    MONOCYTE % 3 %    EOSINOPHIL % 0 %    BASOPHIL % 0 %    METAMYELOCYTE %  0 %    NEUTROPHIL # 33.37 (H) 1.50 - 7.70 x10^3/uL    LYMPHOCYTE # 0.71 (L) 1.00 - 4.80 x10^3/uL    MONOCYTE # 1.07 0.20 - 1.10 x10^3/uL    EOSINOPHIL # <0.10 <=0.50 x10^3/uL    BASOPHIL # <0.10 <=0.20 x10^3/uL    NRBC FROM MANUAL DIFF 24 per 100 WBC    ANISOCYTOSIS 2+/Moderate (A) None   URINALYSIS, MACROSCOPIC AND MICROSCOPIC W/CULTURE REFLEX    Specimen: Urine, Clean Catch    Narrative    The following orders were created for panel order URINALYSIS, MACROSCOPIC AND MICROSCOPIC W/CULTURE REFLEX.  Procedure  Abnormality         Status                     ---------                               -----------         ------                     URINALYSIS, MACROSCOPIC[[id number]]                          In process                 URINALYSIS, MICROSCOPIC[[id number]]                          In process                   Please view results for these tests on the individual orders.   POC BLOOD GLUCOSE (RESULTS)   Result Value Ref Range    GLUCOSE, POC 161 (H) 70 - 105 mg/dl   POC BLOOD GLUCOSE (RESULTS)   Result Value Ref Range    GLUCOSE, POC 146 (H) 70 - 105 mg/dl   POC BLOOD GLUCOSE (RESULTS)   Result Value Ref Range    GLUCOSE, POC 149 (H) 70 - 105 mg/dl   POC BLOOD GLUCOSE (RESULTS)   Result Value Ref Range    GLUCOSE, POC 114 (H) 70 - 105 mg/dl       I/O:  Date 04/07/21 0700 - 04/08/21 0659 04/08/21 0700 - 04/09/21 0659   Shift 0700-1459 1500-2259 2300-0659 24 Hour Total 0700-1459 1500-2259 2300-0659 24 Hour Total   INTAKE   P.O. 300  0 300         Oral 300  0 300       I.V.(mL/kg/hr) 82.35(0.13) 42.27(0.07)   124.62(0.07)         Heparin Volume 82.35 42.27  124.62       Shift Total(mL/kg) 382.35(4.93) 42.27(0.54) 0(0) 424.62(5.47)       OUTPUT   Urine(mL/kg/hr) 300(0.48) 650(1.05) 625(1.01) 1575(0.85)         Urine (Voided) 300 [phone]         Urine Occurrence 1 x   1 x       Drains 35  17 52         JP Drain Output [employer] Drain Right;Upper Abdomen) 35  17 52       Stool             Stool Occurrence 1 x   1 x       Shift Total(mL/kg) 335(4.32) 650(8.38) 642(8.27) 1627(20.97)       Weight (kg) 77.6 77.6 77.6 77.6 77.6 77.6 77.6 77.6       Radiology  Results for orders placed or performed during the hospital encounter of 04/03/21 (from the past 72 hour(s))   XR AP MOBILE CHEST     Status: None    Narrative    [place]  Male, 76 years old.    XR AP MOBILE CHEST performed on 04/06/2021 10:41 AM.    REASON FOR EXAM:  Cough    TECHNIQUE: 1 views/1 images submitted for interpretation.    COMPARISON:  04/03/2021.  FINDINGS: The endotracheal and enteric tubes have been removed. A surgical drain again overlies the left upper quadrant. Stable cardiac silhouette. Stable small left pleural effusion with adjacent atelectasis. No pneumothorax.      Impression    Stable cardiomegaly, small left pleural effusion and adjacent atelectasis.         XR AP MOBILE CHEST     Status: None    Narrative    HALDON CARLEY [place]  Male, 76 years old.    XR AP MOBILE CHEST performed on 04/07/2021 10:32 AM.    REASON FOR EXAM:  Concern for pneumonia    TECHNIQUE: 1 views/1 images submitted for interpretation.    COMPARISON:  04/06/2021    FINDINGS:  Stable appearing elevation right hemidiaphragm. Bibasilar opacity most suggestive of atelectasis unchanged from prior. No new focal lung consolidation. Small left-sided effusion. Cardiac contour is unchanged. There is no pneumothorax.      Impression    Small volume left-sided effusion. Bibasilar opacity. No detrimental interval change from prior.       Current Medications:  Current  Facility-Administered Medications   Medication Dose Route Frequency    acetaminophen (TYLENOL) tablet  650 mg Oral Q4H    atorvastatin (LIPITOR) tablet  40 mg Oral Daily with Breakfast    benzonatate (TESSALON) capsule  100 mg Oral Q8H PRN    cefepime (MAXIPIME) 2 g in NS 100 mL IVPB minibag  2 g Intravenous Now    cefepime (MAXIPIME) 2 g in NS 100 mL IVPB minibag  2 g Intravenous Q12H    D5W 250 mL flush bag   Intravenous Q15 Min PRN    docusate sodium (COLACE) capsule  100 mg Oral 2x/day    electrolyte-A (PLASMALYTE-A) premix infusion   Intravenous Continuous    elvitegravir-cobicistat-emtricitrabine-tenofovir (GENVOYA) 150-150-200-10 mg per tablet  1 Tablet Oral Daily    gabapentin (NEURONTIN) capsule  300 mg Oral 3x/day    heparin 25,000 units in NS 250 mL infusion  12 Units/kg/hr (Adjusted) Intravenous Continuous    HYDROmorphone (DILAUDID) 1 mg/mL injection  0.2 mg Intravenous Q3H PRN    levothyroxine (SYNTHROID) tablet  88 mcg Oral QAM    methocarbamol (ROBAXIN) tablet  1,000 mg Oral 4x/day    NS 250 mL flush bag   Intravenous Q15 Min PRN    NS flush syringe  2-6 mL Intracatheter Q8HRS    NS flush syringe  2-6 mL Intracatheter Q1 MIN PRN    NS flush syringe  10-30 mL Intracatheter Q8HRS    NS flush syringe  20-30 mL Intracatheter Q1 MIN PRN    ondansetron (ZOFRAN) 2 mg/mL injection  4 mg Intravenous Q6H PRN    oxyCODONE (ROXICODONE) immediate release tablet  5 mg Oral Q4H PRN    oxyCODONE (ROXICODONE) immediate releaste tablet  10 mg Oral Q4H PRN    pantoprazole (PROTONIX) delayed release tablet  40 mg Oral Daily before Breakfast    scopolamine 1 mg over 3 days transdermal patch  1 Patch Transdermal Q72H    sennosides-docusate sodium (SENOKOT-S) 8.6-'50mg'$  per tablet  1 Tablet Oral 2x/day    SSIP insulin R human (HUMULIN R) 100 units/mL injection  0-12 Units Subcutaneous Q6H PRN    vancomycin (VANCOCIN) 1 g in D5W 200 mL premix IVPB  15 mg/kg (Adjusted) Intravenous Q24H    Vancomycin IV - Pharmacist to Dose per  Protocol   Does not apply Daily PRN       Allene Pyo, PA-C  04/08/2021, 08:00  I saw and examined this patient with the mid-level provider above.  Please see the mid-level's note, which I have carefully reviewed, for full details. I agree with the findings and plan of care as documented in the mid-level's note.  Any additions/exceptions are edited/noted.    Clinically looks well, does complain of some pain, coughing up significant sputum.  On examination, abdomen is soft, incisions intact with staples, drain is dark consistent with old blood.  Lab work is notable for WBC that is rising, now 35.5.  I must begin concern for the possibility of ongoing pancreatic leak.  We will plan for CT of the chest, abdomen, and pelvis today.  Will initiate antibiotics given the significant spike in his leukocytosis.    Illene Silver, MD 04/08/2021 16:59  Associate Professor of Surgery  Division of [facility]

## 2021-04-08 NOTE — Care Plan (Signed)
04/08/21 7034   Therapist Pager   PT Assigned/ Pager # Sharrie Rothman [phone]   Rehab Session   Document Type rehab contact note   Total PT Minutes: 0   Basic Mobility Am-PAC/6Clicks Score (APPROVED PT Staff, [place], [place], [place], [place], and FMT)   Patient Mobility Barrier Patient declined treatment  (pt asking to hold session to allow time for consumption of CT contrast. Will follow up tomorrow.)       Lorenda Peck, PT, DPT, CCS  [phone]

## 2021-04-08 NOTE — Care Management Notes (Signed)
[facility]  Care Management Note    Patient Name: [name]  Date of Birth: 12/06/1945  Sex: male  Date/Time of Admission: 04/03/2021  2:01 PM  Room/Bed: 28/A  Payor: MEDICARE / Plan: MEDICARE PART A AND B / Product Type: Medicare /    LOS: 5 days   Primary Care Providers:  [place] (General)    Admitting Diagnosis:  Intra abdominal hemorrhage [R58]    Assessment:      04/08/21 1140   Assessment Details   Assessment Type Continued Assessment   Date of Care Management Update 04/08/21   Date of Next DCP Update 04/09/21   Care Management Plan   Discharge Planning Status plan in progress   Projected Discharge Date 04/09/21   Discharge plan discussed with: Patient;Other (Comment)  (Family)   Discharge Needs Assessment   Discharge Facility/Level of Care Needs Home vs Acute Rehab (not psych)     Requiring oxygen at 6L [place]. Continue heparin drip. Started on IV ABX. Monitoring labs. Currently on full liquid diet. Chest PE protocol. JP drain in place. PT/OT ordered and are recommending inpatient rehab. Met with pt and family at bedside to discuss therapy recommendations. Pt and family would like to wait and discuss d/c plans closer to d/c. Will follow.     Discharge Plan:  Home vs acute rehab (not psych)    The patient will continue to be evaluated for developing discharge needs.     Case Manager: Doristine Locks, [place]  Phone: [phone]

## 2021-04-08 NOTE — Consults (Signed)
Marland Kitchen          [facility]  INITIAL CONSULT NOTE      Patient Name: [name]  Patient MRN: [id number]  Patient DOB: Aug 27, 1945  Date of Service:04/08/2021  Admission Dx: Intra abdominal hemorrhage    Consult Requested By: Billie Lade    Consulted regarding 76 year old male with recent distal pancreatecomy/splenectomy on 2/21 for IPMN. Presented to outside facility and was found to have bleeding at surgical site; s/p ex lap 3/4 with massive blood transfusion. Transferred to [facility] post op. CT imaging consistent with perihepatic partially loculated fluid collection with multiple smaller continguous and non contiguous peripancreatic collections. Consulted for RUQ drain.    Pertinent Imaging reviewed.    VITALS            BP 135/86   Pulse (!) 102   Temp 36.7 C (98 F)   Resp 20   Ht 1.753 m (_0 )   Wt 77.6 kg (171 lb 1.2 oz)   SpO2 94%   BMI 25.26 kg/m       LABS    CBC   Recent Labs     04/06/21  0528 04/07/21  0440 04/08/21  0402   WBC 23.5* 24.9* 35.5*   HGB 10.0* 9.3* 9.7*   HCT 29.7* 28.5* 29.2*   PLTCNT 385 367 392      BMP   Recent Labs     04/08/21  0402   SODIUM 134*   POTASSIUM 4.5   CHLORIDE 101   CO2 27   BUN 16   CREATININE 0.69*   GLUCOSENF 122   ANIONGAP 6   BUNCRRATIO 23*   GFR >90   CALCIUM 8.7*   MAGNESIUM 2.0   PHOSPHORUS 2.3      CoAgs   Recent Labs     04/08/21  1007   APTT 55.5*        PMH  Past Medical History:   Diagnosis Date    Cancer (CMS HCC)     prostate    Deep vein thrombosis (DVT) (CMS HCC)     right leg    Esophageal reflux     H/O hearing loss     High cholesterol     History of kidney disease     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    HTN (hypertension)     Human immunodeficiency virus (HIV) disease (CMS HCC)     Hyperlipidemia     "borderline"    Hypothyroidism     MRSA (methicillin resistant staph aureus) culture positive 01/02/2021    MRSA left groin abscess 01/03/21    MRSA (methicillin resistant staph aureus) culture positive 01/02/2021    MRSA  blood 01/02/21    Pulmonary embolism (CMS HCC) 03/31/2021    Thyroid disorder     Wears glasses          PSH     Past Surgical History:   Procedure Laterality Date    COLON SURGERY      per patient had 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 Darryl Nestle CERVICAL SPINE SURGERY  2001    HX COLOSTOMY REVERSAL      HX HERNIA REPAIR      KNEE SURGERY Bilateral  WRIST SURGERY Right              FAMILY HISTORY:    His family history includes Cancer in his father, mother, and another family member; Heart Attack in his mother; High Cholesterol in an other family member; Stroke in an other family member.     SOCIAL HISTORY:    He  reports that he does not currently use alcohol. He  reports that he has never smoked. He has never used smokeless tobacco. He  reports no history of drug use.     OUTPATIENT MEDICATIONS  Medications Prior to Admission       Prescriptions    acetaminophen (TYLENOL) 500 mg Oral Tablet    Take 2 Tablets (1,000 mg total) by mouth Every 4 hours as needed for Pain    amLODIPine (NORVASC) 5 mg Oral Tablet    Take 1 Tablet (5 mg total) by mouth Once a day    apixaban (ELIQUIS) 5 mg Oral Tablet    Take 2 Tablets (10 mg total) by mouth Twice daily for 14 doses    apixaban (ELIQUIS) 5 mg Oral Tablet    Take 1 Tablet (5 mg total) by mouth Twice daily for 180 days    atorvastatin (LIPITOR) 80 mg Oral Tablet    Take 0.5 Tablets (40 mg total) by mouth Every morning with breakfast    BIOTIN ORAL    Take 1 Tablet by mouth Once a day    Blood Sugar Diagnostic (ACCU-CHEK GUIDE TEST STRIPS) Strip    1 Strip Four times a day - before meals and bedtime    cetirizine (ZYRTEC) 10 mg Oral Tablet    Take 1 Tablet (10 mg total) by mouth Once a day    cyanocobalamin (VITAMIN B 12) 1,000 mcg Oral Tablet    Take 1 Tablet (1,000 mcg total) by mouth Every morning     elviteg-cob-emtri-tenof ALAFEN (GENVOYA) 150-150-200-10 mg Oral Tablet    Take 1 Tablet by mouth Once a day    flash glucose scanning reader (FREESTYLE LIBRE 2 READER) Does not apply Misc    Use as directed with FreeStyle Libre 2 sensors    flash glucose sensor (FREESTYLE LIBRE 2 SENSOR) Does not apply Kit    To check blood glucose continuously. Change every 14 days    gabapentin (NEURONTIN) 300 mg Oral Capsule    Take 1 Capsule (300 mg total) by mouth Three times a day    insulin aspart U-100 (NOVOLOG) 100 unit/mL (3 mL) Subcutaneous Insulin Pen    Inject 4 Units under the skin Three times a day With meals    insulin glargine (LANTUS SOLOSTAR U-100 INSULIN) 100 unit/mL Subcutaneous Insulin Pen    Inject 15 units under the skin daily    insulin syr/ndl U100 half mark 0.3 mL 31 gauge x 5/16" Syringe    Use a new syringe for each injection.    lancets (FREESTYLE LANCETS) 28 gauge Misc    Use to test blood sugar 4 times a day - before meals and bedtime    levothyroxine (SYNTHROID) 88 mcg Oral Tablet    Take 1 Tablet (88 mcg total) by mouth Every morning    melatonin 5 mg Oral Tablet    Take 1 Tablet (5 mg total) by mouth Every night    MULTIVITAMIN ORAL    Take 1 Tablet by mouth Once a day    pantoprazole (PROTONIX) 40 mg Oral Tablet, Delayed Release (E.C.)    Take  1 Tablet (40 mg total) by mouth Twice daily 30 minutes before a meal    Pen Needle, Disposable, (BD NANO 2ND GEN PEN NEEDLE) 32 gauge x 5/32" Needle    Use a new pen needle for each injection.    POTASSIUM ORAL    Take 1 Tablet by mouth Once a day          INPATIENT MEDICATIONS  acetaminophen (TYLENOL) tablet, 650 mg, Oral, Q4H  atorvastatin (LIPITOR) tablet, 40 mg, Oral, Daily with Breakfast  benzonatate (TESSALON) capsule, 100 mg, Oral, Q8H PRN  cefepime (MAXIPIME) 2 g in NS 100 mL IVPB minibag, 2 g, Intravenous, Q12H  D5W 250 mL flush bag, , Intravenous, Q15 Min PRN  diatrizoate meglumine & sodium oral solution, 8 mL, Oral, Give in Radiology  docusate  sodium (COLACE) capsule, 100 mg, Oral, 2x/day  electrolyte-A (PLASMALYTE-A) premix infusion, , Intravenous, Continuous  elvitegravir-cobicistat-emtricitrabine-tenofovir (GENVOYA) 150-150-200-10 mg per tablet, 1 Tablet, Oral, Daily  gabapentin (NEURONTIN) capsule, 300 mg, Oral, 3x/day  heparin 25,000 units in NS 250 mL infusion, 12 Units/kg/hr (Adjusted), Intravenous, Continuous  HYDROmorphone (DILAUDID) 1 mg/mL injection, 0.2 mg, Intravenous, Q3H PRN  levothyroxine (SYNTHROID) tablet, 88 mcg, Oral, QAM  methocarbamol (ROBAXIN) tablet, 1,000 mg, Oral, 4x/day  NS 250 mL flush bag, , Intravenous, Q15 Min PRN  NS flush syringe, 2-6 mL, Intracatheter, Q8HRS  NS flush syringe, 2-6 mL, Intracatheter, Q1 MIN PRN  NS flush syringe, 10-30 mL, Intracatheter, Q8HRS  NS flush syringe, 20-30 mL, Intracatheter, Q1 MIN PRN  ondansetron (ZOFRAN) 2 mg/mL injection, 4 mg, Intravenous, Q6H PRN  oxyCODONE (ROXICODONE) immediate release tablet, 5 mg, Oral, Q4H PRN  oxyCODONE (ROXICODONE) immediate releaste tablet, 10 mg, Oral, Q4H PRN  pantoprazole (PROTONIX) delayed release tablet, 40 mg, Oral, Daily before Breakfast  scopolamine 1 mg over 3 days transdermal patch, 1 Patch, Transdermal, Q72H  sennosides-docusate sodium (SENOKOT-S) 8.6-50mg per tablet, 1 Tablet, Oral, 2x/day  SSIP insulin R human (HUMULIN R) 100 units/mL injection, 0-12 Units, Subcutaneous, Q6H PRN  vancomycin (VANCOCIN) 1 g in D5W 200 mL premix IVPB, 15 mg/kg (Adjusted), Intravenous, Q12H  Vancomycin IV - Pharmacist to Dose per Protocol, , Does not apply, Daily PRN        MEDICATION ALLERGIES  No Known Allergies        ASSESSMENT & PLAN  76 y.o. male with right upper quadrant and peripancreatic fluid collections s/p distal pancreatecomy/splenectomy for IPMN.    1 - Abdominal fluid collection   - Plan for abdominal drain placement, time to be determined  - NPO midnight  - Hold heparin 6 hours pre procedure; may reinitiate 8 hours post.     Discussed with IR attending and  approved    Contact IR resident with patient status updates: Pager [phone]  Contact IR charge nurse for questions regarding scheduling: Phone [place], DO  04/08/2021, 13:59              I have discussed the case with the resident and agree with the documentation and plan as discussed above.       Warren Lacy, MD  [facility] Attending  Dept of Radiology      04/08/2021, 16:24

## 2021-04-08 NOTE — Care Plan (Signed)
Pt. Has been refusing his cough Assist throughout the day. Pt. Stated he was in pain and had not slept since 1 A.M.  RN aware. Pt. Wanted to sleep. Will continue to monitor pt.

## 2021-04-09 ENCOUNTER — Encounter (HOSPITAL_COMMUNITY)
Admission: AD | Disposition: A | Discharge status: Rehabilitation Facility | Payer: Self-pay | Source: Other Acute Inpatient Hospital | Attending: SURGICAL ONCOLOGY

## 2021-04-09 ENCOUNTER — Ambulatory Visit (HOSPITAL_COMMUNITY): Payer: 59

## 2021-04-09 ENCOUNTER — Inpatient Hospital Stay (HOSPITAL_COMMUNITY): Payer: 59

## 2021-04-09 DIAGNOSIS — J90 Pleural effusion, not elsewhere classified: Secondary | ICD-10-CM

## 2021-04-09 DIAGNOSIS — R58 Hemorrhage, not elsewhere classified: Secondary | ICD-10-CM

## 2021-04-09 DIAGNOSIS — D72829 Elevated white blood cell count, unspecified: Secondary | ICD-10-CM

## 2021-04-09 DIAGNOSIS — B9689 Other specified bacterial agents as the cause of diseases classified elsewhere: Secondary | ICD-10-CM

## 2021-04-09 DIAGNOSIS — I2693 Single subsegmental pulmonary embolism without acute cor pulmonale: Secondary | ICD-10-CM

## 2021-04-09 DIAGNOSIS — Z9889 Other specified postprocedural states: Secondary | ICD-10-CM

## 2021-04-09 LAB — HEPATIC FUNCTION PANEL
ALBUMIN: 1.6 g/dL — ABNORMAL LOW (ref 3.4–4.8)
ALKALINE PHOSPHATASE: 253 U/L — ABNORMAL HIGH (ref 45–115)
ALT (SGPT): 52 U/L (ref 10–55)
AST (SGOT): 56 U/L — ABNORMAL HIGH (ref 8–45)
BILIRUBIN DIRECT: 0.5 mg/dL — ABNORMAL HIGH (ref 0.1–0.4)
BILIRUBIN TOTAL: 1 mg/dL (ref 0.3–1.3)
PROTEIN TOTAL: 5.4 g/dL — ABNORMAL LOW (ref 6.0–8.0)

## 2021-04-09 LAB — MANUAL DIFF AND MORPHOLOGY-SYSMEX
BASOPHIL #: <0.1 10*3/uL (ref <=0.20)
BASOPHIL #: <0.1 10*3/uL (ref <=0.20)
BASOPHIL %: 0 %
BASOPHIL %: 0 %
EOSINOPHIL #: <0.1 10*3/uL (ref <=0.50)
EOSINOPHIL #: <0.1 10*3/uL (ref <=0.50)
EOSINOPHIL %: 0 %
EOSINOPHIL %: 0 %
LYMPHOCYTE #: 0.43 10*3/uL — ABNORMAL LOW (ref 1.00–4.80)
LYMPHOCYTE #: 0.8 10*3/uL — ABNORMAL LOW (ref 1.00–4.80)
LYMPHOCYTE %: 1 %
LYMPHOCYTE %: 2 %
METAMYELOCYTE %: 0 %
METAMYELOCYTE %: 0 %
MONOCYTE #: 2.55 10*3/uL — ABNORMAL HIGH (ref 0.20–1.10)
MONOCYTE #: 1.19 10*3/uL — ABNORMAL HIGH (ref 0.20–1.10)
MONOCYTE %: 6 %
MONOCYTE %: 3 %
MYELOCYTE %: 1 %
NEUTROPHIL #: 39.53 10*3/uL — ABNORMAL HIGH (ref 1.50–7.70)
NEUTROPHIL #: 37.41 10*3/uL — ABNORMAL HIGH (ref 1.50–7.70)
NEUTROPHIL %: 93 %
NEUTROPHIL %: 94 %
NEUTROPHIL BANDS %: 0 %
NRBC FROM MANUAL DIFF: 7 per 100 WBC
NRBC FROM MANUAL DIFF: 6 per 100 WBC
RBC MORPHOLOGY: NORMAL

## 2021-04-09 LAB — BASIC METABOLIC PANEL
ANION GAP: 10 mmol/L (ref 4–13)
ANION GAP: 8 mmol/L (ref 4–13)
BUN/CREA RATIO: 19 (ref 6–22)
BUN/CREA RATIO: 20 (ref 6–22)
BUN: 14 mg/dL (ref 8–25)
BUN: 14 mg/dL (ref 8–25)
CALCIUM: 8.9 mg/dL (ref 8.8–10.2)
CALCIUM: 8.1 mg/dL — ABNORMAL LOW (ref 8.8–10.2)
CHLORIDE: 99 mmol/L (ref 96–111)
CHLORIDE: 100 mmol/L (ref 96–111)
CO2 TOTAL: 25 mmol/L (ref 23–31)
CO2 TOTAL: 25 mmol/L (ref 23–31)
CREATININE: 0.74 mg/dL — ABNORMAL LOW (ref 0.75–1.35)
CREATININE: 0.71 mg/dL — ABNORMAL LOW (ref 0.75–1.35)
ESTIMATED GFR: >90 mL/min/BSA (ref >=60)
ESTIMATED GFR: >90 mL/min/BSA (ref >=60)
GLUCOSE: 98 mg/dL (ref 65–125)
GLUCOSE: 109 mg/dL (ref 65–125)
POTASSIUM: 4.8 mmol/L (ref 3.5–5.1)
POTASSIUM: 4.6 mmol/L (ref 3.5–5.1)
SODIUM: 134 mmol/L — ABNORMAL LOW (ref 136–145)
SODIUM: 133 mmol/L — ABNORMAL LOW (ref 136–145)

## 2021-04-09 LAB — TYPE AND CROSS RED CELLS - UNITS
ABO/RH(D): A NEG
ANTIBODY SCREEN: NEGATIVE
UNITS ORDERED: 6

## 2021-04-09 LAB — CBC WITH DIFF
HCT: 37.3 % — ABNORMAL LOW (ref 38.9–52.0)
HCT: 26.8 % — ABNORMAL LOW (ref 38.9–52.0)
HGB: 12 g/dL — ABNORMAL LOW (ref 13.4–17.5)
HGB: 8.9 g/dL — ABNORMAL LOW (ref 13.4–17.5)
MCH: 29.5 pg (ref 26.0–32.0)
MCH: 30.3 pg (ref 26.0–32.0)
MCHC: 32.2 g/dL (ref 31.0–35.5)
MCHC: 33.2 g/dL (ref 31.0–35.5)
MCV: 91.6 fL (ref 78.0–100.0)
MCV: 91.2 fL (ref 78.0–100.0)
MPV: 11.6 fL (ref 8.7–12.5)
MPV: 11.2 fL (ref 8.7–12.5)
PLATELETS: 434 10*3/uL — ABNORMAL HIGH (ref 150–400)
PLATELETS: 397 10*3/uL (ref 150–400)
RBC: 4.07 10*6/uL — ABNORMAL LOW (ref 4.50–6.10)
RBC: 2.94 10*6/uL — ABNORMAL LOW (ref 4.50–6.10)
RDW-CV: 16.1 % — ABNORMAL HIGH (ref 11.5–15.5)
RDW-CV: 16.1 % — ABNORMAL HIGH (ref 11.5–15.5)
WBC: 42.5 10*3/uL — ABNORMAL HIGH (ref 3.7–11.0)
WBC: 39.8 10*3/uL — ABNORMAL HIGH (ref 3.7–11.0)

## 2021-04-09 LAB — POC BLOOD GLUCOSE (RESULTS)
GLUCOSE, POC: 109 mg/dl — ABNORMAL HIGH (ref 70–105)
GLUCOSE, POC: 154 mg/dl — ABNORMAL HIGH (ref 70–105)
GLUCOSE, POC: 117 mg/dl — ABNORMAL HIGH (ref 70–105)

## 2021-04-09 LAB — LACTIC ACID LEVEL W/ REFLEX FOR LEVEL >2.0: LACTIC ACID: 0.8 mmol/L (ref 0.5–2.2)

## 2021-04-09 LAB — TROPONIN-I: TROPONIN I: <7 ng/L (ref 0–30)

## 2021-04-09 LAB — MAGNESIUM
MAGNESIUM: 2 mg/dL (ref 1.8–2.6)
MAGNESIUM: 2 mg/dL (ref 1.8–2.6)

## 2021-04-09 LAB — H & H
HCT: 28.5 % — ABNORMAL LOW (ref 38.9–52.0)
HCT: 27.8 % — ABNORMAL LOW (ref 38.9–52.0)
HGB: 9.3 g/dL — ABNORMAL LOW (ref 13.4–17.5)
HGB: 8.9 g/dL — ABNORMAL LOW (ref 13.4–17.5)

## 2021-04-09 LAB — PHOSPHORUS
PHOSPHORUS: 2.3 mg/dL (ref 2.3–4.0)
PHOSPHORUS: 2.2 mg/dL — ABNORMAL LOW (ref 2.3–4.0)

## 2021-04-09 LAB — TEG, RAPID GLOBAL WITH LYSIS (TRAUMA)
LYS30 (%): 0 % (ref 0.0–2.6)
LYS30 (%): 0.1 % (ref 0.0–2.6)
MA (CRT RAPID): >75 mm — ABNORMAL HIGH (ref 52.0–70.0)
MA (CRT RAPID): >75 mm — ABNORMAL HIGH (ref 52.0–70.0)
MA FIBRINOGEN (CFF): >52 mm — ABNORMAL HIGH (ref 15.0–32.0)
MA FIBRINOGEN (CFF): >52 mm — ABNORMAL HIGH (ref 15.0–32.0)
R (CK): 6.4 min (ref 4.6–9.1)
R (CK): 5.7 min (ref 4.6–9.1)

## 2021-04-09 LAB — PTT (PARTIAL THROMBOPLASTIN TIME): APTT: 52.7 seconds — ABNORMAL HIGH (ref 24.2–37.5)

## 2021-04-09 SURGERY — IR EMBOLIZATION/BODY

## 2021-04-09 MED ORDER — LIDOCAINE HCL 10 MG/ML (1 %) INJECTION SOLUTION
INTRAMUSCULAR | Status: AC
Start: 2021-04-09 — End: 2021-04-09
  Filled 2021-04-09: qty 20

## 2021-04-09 MED ORDER — LIDOCAINE HCL 10 MG/ML (1 %) INJECTION SOLUTION
Freq: Once | INTRAMUSCULAR | Status: DC | PRN
Start: 2021-04-09 — End: 2021-04-09
  Administered 2021-04-09 (×2): 10 mL

## 2021-04-09 MED ORDER — IOPAMIDOL 300 MG IODINE/ML (61 %) INTRAVENOUS SOLUTION
Freq: Once | INTRAVENOUS | Status: DC | PRN
Start: 2021-04-09 — End: 2021-04-09
  Administered 2021-04-09: 160 mL via INTRA_ARTERIAL

## 2021-04-09 MED ORDER — FENTANYL (PF) 50 MCG/ML INJECTION SOLUTION
INTRAMUSCULAR | Status: AC
Start: 2021-04-09 — End: 2021-04-09
  Filled 2021-04-09: qty 2

## 2021-04-09 MED ORDER — IOPAMIDOL 370 MG IODINE/ML (76 %) INTRAVENOUS SOLUTION
100.0000 mL | INTRAVENOUS | Status: AC
Start: 2021-04-09 — End: 2021-04-09
  Administered 2021-04-09: 100 mL via INTRAVENOUS

## 2021-04-09 MED ORDER — METRONIDAZOLE 250 MG TABLET
500.0000 mg | ORAL_TABLET | Freq: Two times a day (BID) | ORAL | Status: DC
Start: 2021-04-09 — End: 2021-04-12
  Administered 2021-04-09 – 2021-04-12 (×6): 500 mg via ORAL
  Filled 2021-04-09 (×6): qty 2

## 2021-04-09 MED ORDER — IOPAMIDOL 370 MG IODINE/ML (76 %) INTRAVENOUS SOLUTION
100.0000 mL | INTRAVENOUS | Status: DC
Start: 2021-04-09 — End: 2021-04-15

## 2021-04-09 MED ORDER — MIDAZOLAM 1 MG/ML INJECTION SOLUTION
INTRAMUSCULAR | Status: AC
Start: 2021-04-09 — End: 2021-04-09
  Filled 2021-04-09: qty 2

## 2021-04-09 MED ORDER — ELECTROLYTE-A INTRAVENOUS SOLUTION BOLUS
500.0000 mL | Freq: Once | INTRAVENOUS | Status: AC
Start: 2021-04-09 — End: 2021-04-09
  Administered 2021-04-09: 0 mL via INTRAVENOUS
  Administered 2021-04-09: 500 mL via INTRAVENOUS

## 2021-04-09 MED ORDER — FENTANYL (PF) 50 MCG/ML INJECTION SOLUTION
Freq: Once | INTRAMUSCULAR | Status: DC | PRN
Start: 2021-04-09 — End: 2021-04-09
  Administered 2021-04-09: 50 ug via INTRAVENOUS
  Administered 2021-04-09: 25 ug via INTRAVENOUS
  Administered 2021-04-09: 50 ug via INTRAVENOUS
  Administered 2021-04-09 (×3): 25 ug via INTRAVENOUS
  Administered 2021-04-09: 50 ug via INTRAVENOUS

## 2021-04-09 MED ORDER — MIDAZOLAM (PF) 1 MG/ML INJECTION SOLUTION
Freq: Once | INTRAMUSCULAR | Status: DC | PRN
Start: 2021-04-09 — End: 2021-04-09
  Administered 2021-04-09 (×6): .5 mg via INTRAVENOUS

## 2021-04-09 MED ORDER — ALBUMIN, HUMAN 5 % INTRAVENOUS SOLUTION
25.0000 g | Freq: Once | INTRAVENOUS | Status: AC
Start: 2021-04-09 — End: 2021-04-09
  Administered 2021-04-09: 25 g via INTRAVENOUS
  Administered 2021-04-09: 0 g via INTRAVENOUS
  Filled 2021-04-09 (×2): qty 500

## 2021-04-09 SURGICAL SUPPLY — 28 items
BAG WASTE 48IN 72IN SPIKE FEMALE LL RFLX VALVE LRG BORE MRT DSPSL MSR DISP 1400ML (IV TUBING & ACCESSORIES) ×1 IMPLANT
BAG WASTE SALINE/CONTRAST_K1000576 10/BX (IV TUBING & ACCESSORIES) ×1
CATH ANGIO 5FR MIK CURVE 80CM TR NB ADV CRBRL ACPT .038IN GW (CATHETERS) ×1 IMPLANT
CATH ANGIO 5FR MIK CURVE 80CM_TRNB AD RADOPQ BRD ATRM TIP (CATHETERS) ×1
CATH DRAIN MCLC 12FR 25CM .038_IN 6 SDPRT MC LC LOCK LOOP (MED SURG SUPPLIES) ×1
CATH DRAIN MCLC 12FR 25CM 6 SDPRT LOCK LOOP RADOPQ MULTIPURP ULTHNE HDRPH STRL LF  DISP ACPT .038IN (MED SURG SUPPLIES) ×1 IMPLANT
CATH PROGREAT 2.8FR .027IN 130_CM RADOPQ INTVN MICRO HDRPH (CATHETERS) ×1
CATH PROGREAT 2.8FR 70CM 130CM RADOPQ COAX KINK RST INTVN MICRO PERI DIST TUNG PTFE HDRPH ACPT .021 (CATHETERS) ×1 IMPLANT
COIL EMBL 2MM 2CM AZUR CX HDRCL DTCH LOOP SLD COR PERI DDRGL .018IN (IMPLANTS CARDIOTHORACIC) ×1 IMPLANT
COIL EMBL 2MM 4CM AZUR CX HDRCL DTCH LOOP SLD COR PERI DDRGL .018IN (IMPLANTS CARDIOTHORACIC) ×2 IMPLANT
COIL EMBL 4MM .021-.027IN 13CM AZUR CX DTCH PERI PLAT DDRGL HDRPH PLMR .018IN STRL DISP (IMPLANTS CARDIOTHORACIC) ×2 IMPLANT
COIL EMBL 4MM 20CM AZUR HDRCL DTCH PERI PLAT DDRGL .018IN (IMPLANTS CARDIOTHORACIC) ×1 IMPLANT
COIL EMBL 5MM .021-.027IN 16CM AZUR CX DTCH SFT FLXB DSGN VAR LOOP MCTH PERI DDRGL .018IN (IMPLANTS CARDIOTHORACIC) ×1 IMPLANT
CONV USE 338045 - PACK SURG CSTM RAD (MED SURG SUPPLIES) ×1 IMPLANT
DEVICE CLSR ANGIOSEAL VIP 6FR 70CM HMST BIOABS INSERTION SHEATH GW CO-PLMR CLGN VAS STRL LF  DISP (IMPLANTS CARDIOTHORACIC) ×1 IMPLANT
DEVICE EMBL AZUR DTCH (CARDIAC) ×2 IMPLANT
DEVICE FLOW CONTROL FLOWSWITCH 1 WY HI PRESS 1050 PSI (IV TUBING & ACCESSORIES) ×1 IMPLANT
DEVICE FLOW CONTROL FLSWTCH 1_WY HI PRESS 1050 PSI (IV TUBING & ACCESSORIES) ×1
GW .035IN 15CM 145CM FLPY FLX_P FIX COR STD SHFT SS PTFE (WIRE) ×1
GW .035IN 15CM 145CM FLX TP SS PTFE VAS BNTSN TAPER (WIRE) ×1 IMPLANT
GW GLDWR .035IN 3CM 150CM FLXB ANG TIP RADOPQ KINK RST NITINOL TUNG POLYUR HDRPH VAS STD (WIRE) ×1 IMPLANT
GW GLDWR .035IN 3CM 150CM RADO_Q STD SHFT FLXB COR TO TIP (WIRE) ×1
KIT SYRINGE CUSTOM_K1205494 (INSTRUMENTS) ×1
KIT SYRINGE CUSTOM_K1205494 (SURGICAL INSTRUMENTS) ×1 IMPLANT
NEEDLE 7CM 18GA WO BASEPLATE P_ROC PERC DISP TW VAS ACCESS (MED SURG SUPPLIES) ×2 IMPLANT
PACK RADIOLOGY ANGIOGRAPHY (MED SURG SUPPLIES) ×2
SHEATH INTROD 10CM 5FR PINN .035IN PERI SS SNAPON DIL LOCK KINK RST SMOOTH TRNS SPRG COIL GW 2.5CM (INTRODUCER) ×1 IMPLANT
SHEATH INTROD PINNACLE 5FRX10_10/BX RSS501 (INTRODUCER) ×1

## 2021-04-09 NOTE — Nurses Notes (Signed)
Patient admitted to [facility] via bed from IR. SICU team notified of patient's arrival and at bedside. Assessment, LDAW, and vitals per primary RN.

## 2021-04-09 NOTE — Care Management Notes (Signed)
[facility]  Care Management Note    Patient Name: [name]  Date of Birth: [birth year]  Sex: male  Date/Time of Admission: 04/03/2021  2:01 PM  Room/Bed: 28/A  Payor: MEDICARE / Plan: MEDICARE PART A AND B / Product Type: Medicare /    LOS: 6 days   Primary Care Providers:  [place] (General)    Admitting Diagnosis:  Intra abdominal hemorrhage [R58]    Assessment:      04/09/21 1213   Assessment Details   Assessment Type Continued Assessment   Date of Care Management Update 04/09/21   Date of Next DCP Update 04/12/21   Care Management Plan   Discharge Planning Status plan in progress   Projected Discharge Date 04/12/21   Discharge Needs Assessment   Discharge Facility/Level of Care Needs Home vs Acute Rehab (not psych)     Requiring oxygen at 6L [place]. Plan for drain placement today with IR. Heparin drip held for procedure. Continue IV ABX. Monitoring labs. JP drain in place; plan to repeat CT A/P w/ contract to rule out bleed. PT/OT ordered and are recommending inpatient rehab at d/c. Pt and family would like to wait and discuss therapy recommendations closer to d/c. Will follow.     Discharge Plan:  Home vs acute rehab (not psych)    The patient will continue to be evaluated for developing discharge needs.     Case Manager: Doristine Locks, [place]  Phone: [phone]

## 2021-04-09 NOTE — Care Plan (Signed)
Problem: Adult Inpatient Plan of Care  Goal: Plan of Care Review  04/09/2021 0944 by Colletta Maryland, RN  Outcome: Ongoing (see interventions/notes)  04/09/2021 0929 by Colletta Maryland, RN  Outcome: Ongoing (see interventions/notes)  Goal: Patient-Specific Goal (Individualized)  04/09/2021 0944 by Colletta Maryland, RN  Outcome: Ongoing (see interventions/notes)  04/09/2021 0929 by Colletta Maryland, RN  Outcome: Ongoing (see interventions/notes)  Goal: Absence of Hospital-Acquired Illness or Injury  04/09/2021 0944 by Colletta Maryland, RN  Outcome: Ongoing (see interventions/notes)  04/09/2021 0929 by Colletta Maryland, RN  Outcome: Ongoing (see interventions/notes)  Intervention: Prevent and Manage VTE (Venous Thromboembolism) Risk  Recent Flowsheet Documentation  Taken 04/09/2021 0820 by Colletta Maryland, RN  VTE Prevention/Management: (heparin gtt off for planned procedure)   dorsiflexion/plantar flexion performed   other (see comments)  Goal: Optimal Comfort and Wellbeing  04/09/2021 0944 by Colletta Maryland, RN  Outcome: Ongoing (see interventions/notes)  04/09/2021 0929 by Colletta Maryland, RN  Outcome: Ongoing (see interventions/notes)  Goal: Rounds/Family Conference  04/09/2021 0944 by Colletta Maryland, RN  Outcome: Ongoing (see interventions/notes)  04/09/2021 0929 by Colletta Maryland, RN  Outcome: Ongoing (see interventions/notes)     Problem: Fall Injury Risk  Goal: Absence of Fall and Fall-Related Injury  04/09/2021 0944 by Colletta Maryland, RN  Outcome: Ongoing (see interventions/notes)  04/09/2021 0929 by Colletta Maryland, RN  Outcome: Ongoing (see interventions/notes)     Problem: Skin Injury Risk Increased  Goal: Skin Health and Integrity  04/09/2021 0944 by Colletta Maryland, RN  Outcome: Ongoing (see interventions/notes)  04/09/2021 0929 by Colletta Maryland, RN  Outcome: Ongoing (see interventions/notes)

## 2021-04-09 NOTE — Nurses Notes (Signed)
RN emptied pt JP drain twice so far this morning for a total of 110cc, appears to be filling once again.Marland Kitchen was told that it was only putting out about 100cc Q12 hours previously.... surgery oncology provider paged via secure page service. Awaiting response.. pt currently denies any new complaints, VSS. Will follow up and cont. To monitor.

## 2021-04-09 NOTE — Nurses Notes (Signed)
Spoke with Surg Onc MD resident regarding pt status.Marland Kitchen embolization planned in IR.. per MD, pt strict NPO, no PO meds to be given at this time.Marland Kitchen also, drain not to be emptied at this time. Will reassess and cont. To monitor closely.

## 2021-04-09 NOTE — Progress Notes (Signed)
[name]                                               SICU PROGRESS NOTE    [name], [name]  Date of Admission:  04/03/2021  Date of Service: 04/09/2021  Date of Birth:  [birth year]    Primary Attending:  Cyndi Bender, MD  Primary Service:  Surgery Oncology     Post Op Day:Day of Surgery S/P Procedure(s) (LRB):  IR EMBOLIZATION/BODY (N/A)  IR ABSCESS/FLUID DRAINAGE OF(PAFD) (N/A)     LOS: 6 days      Subjective:    Patient arrives to SICU resting in bed and vitally stable. 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 Patient admits to some pain in the RUQ but otherwise is feeling well. R JP and new IR drain actively draining blood on presentation.     Vital Signs:  Temp (24hrs) Max:37.1 C (87.8 F)      Systolic (67EHM), CNO:709 , Min:115 , GGE:366     Diastolic (29UTM), LYY:50, Min:60, Max:99    Temp  Avg: 36.8 C (98.3 F)  Min: 36.7 C (98 F)  Max: 37.1 C (98.8 F)  MAP (Non-Invasive)  Avg: 95.3 mmHG  Min: 81 mmHG  Max: 110 mmHG  Pulse  Avg: 108.6  Min: 93  Max: 125  Resp  Avg: 21.3  Min: 16  Max: 34  SpO2  Avg: 91.2 %  Min: 87 %  Max: 96 %       Meds  No current outpatient medications on file.       Physical Exam:  General: Acutely ill, stable  Eyes:  Conjunctiva clear  HENT:  NCAT, Mucous membranes moist  Lungs:  CTAB, Normal respiratory effort  Cardiovascular:  RR  Abdomen:  S, Tender most pronounced in RUQ, ND. IR drain in place RUQ with bloody output. RLQ JP with dark SS/bloody drainage.   Extremities:  No cyanosis or edema.  Skin:  Skin warm and dry.  Neurologic:  Alert and oriented x3, grossly normal  Psychiatric:  Affect normal.    Labs:  Lab Results Today:    Results for orders placed or performed during the hospital encounter of 04/03/21 (from the past 24 hour(s))   POC BLOOD GLUCOSE (RESULTS)   Result Value Ref Range    GLUCOSE, POC 125 (H) 70 - 105 mg/dl    PTT (PARTIAL THROMBOPLASTIN TIME)   Result Value Ref Range    APTT 98.1 (H) 24.2 - 37.5 seconds   PTT (PARTIAL THROMBOPLASTIN TIME)   Result Value Ref Range    APTT 58.2 (H) 24.2 - 37.5 seconds   MRSA SCREEN, PCR, RAPID    Specimen: Nasal Swab   Result Value Ref Range    MRSA COLONIZATION SCREEN Positive (A) Negative   POC BLOOD GLUCOSE (RESULTS)   Result Value Ref Range    GLUCOSE, POC 122 (H) 70 - 105 mg/dl   PTT (PARTIAL THROMBOPLASTIN TIME)   Result Value Ref Range    APTT 52.7 (H) 24.2 - 37.5 seconds   BASIC METABOLIC PANEL   Result Value Ref Range    SODIUM 134 (L) 136 - 145 mmol/L    POTASSIUM 4.8 3.5 - 5.1 mmol/L    CHLORIDE 99 96 - 111  mmol/L    CO2 TOTAL 25 23 - 31 mmol/L    ANION GAP 10 4 - 13 mmol/L    CALCIUM 8.9 8.8 - 10.2 mg/dL    GLUCOSE 98 65 - 125 mg/dL    BUN 14 8 - 25 mg/dL    CREATININE 0.74 (L) 0.75 - 1.35 mg/dL    BUN/CREA RATIO 19 6 - 22    ESTIMATED GFR >90 >=60 mL/min/BSA   MAGNESIUM   Result Value Ref Range    MAGNESIUM 2.0 1.8 - 2.6 mg/dL   PHOSPHORUS   Result Value Ref Range    PHOSPHORUS 2.3 2.3 - 4.0 mg/dL   CBC WITH DIFF   Result Value Ref Range    WBC 42.5 (H) 3.7 - 11.0 x10^3/uL    RBC 4.07 (L) 4.50 - 6.10 x10^6/uL    HGB 12.0 (L) 13.4 - 17.5 g/dL    HCT 37.3 (L) 38.9 - 52.0 %    MCV 91.6 78.0 - 100.0 fL    MCH 29.5 26.0 - 32.0 pg    MCHC 32.2 31.0 - 35.5 g/dL    RDW-CV 16.1 (H) 11.5 - 15.5 %    PLATELETS 434 (H) 150 - 400 x10^3/uL    MPV 11.6 8.7 - 12.5 fL   MANUAL DIFF AND MORPHOLOGY-SYSMEX   Result Value Ref Range    NEUTROPHIL % 93 %    LYMPHOCYTE %  1 %    MONOCYTE % 6 %    EOSINOPHIL % 0 %    BASOPHIL % 0 %    NEUTROPHIL BANDS % 0 %    METAMYELOCYTE %  0 %    NEUTROPHIL # 39.53 (H) 1.50 - 7.70 x10^3/uL    LYMPHOCYTE # 0.43 (L) 1.00 - 4.80 x10^3/uL    MONOCYTE # 2.55 (H) 0.20 - 1.10 x10^3/uL    EOSINOPHIL # <0.10 <=0.50 x10^3/uL    BASOPHIL # <0.10 <=0.20 x10^3/uL    NRBC FROM MANUAL DIFF 7 per 100 WBC    ANISOCYTOSIS 2+/Moderate (A) None    POLYCHROMASIA 2+/Moderate (A) None    HEPATIC FUNCTION PANEL   Result Value Ref Range    ALBUMIN 1.6 (L) 3.4 - 4.8 g/dL     ALKALINE PHOSPHATASE 253 (H) 45 - 115 U/L    ALT (SGPT) 52 10 - 55 U/L    AST (SGOT)  56 (H) 8 - 45 U/L    BILIRUBIN TOTAL 1.0 0.3 - 1.3 mg/dL    BILIRUBIN DIRECT 0.5 (H) 0.1 - 0.4 mg/dL    PROTEIN TOTAL 5.4 (L) 6.0 - 8.0 g/dL   POC BLOOD GLUCOSE (RESULTS)   Result Value Ref Range    GLUCOSE, POC 109 (H) 70 - 105 mg/dl   H & H   Result Value Ref Range    HGB 9.3 (L) 13.4 - 17.5 g/dL    HCT 28.5 (L) 38.9 - 52.0 %   TEG, RAPID GLOBAL WITH LYSIS (TRAUMA)   Result Value Ref Range    R (CK) 6.4 4.6 - 9.1 min    LYS30 (%) 0.0 0.0 - 2.6 %    MA (CRT RAPID) >75.0 (H) 52.0 - 70.0 mm    MA FIBRINOGEN (CFF) >52.0 (H) 15.0 - 32.0 mm   TYPE AND CROSS RED CELLS - UNITS , 2 Units   Result Value Ref Range    UNITS ORDERED 2     SPECIMEN EXPIRATION DATE 04/12/2021,2359     ABO/RH(D) A NEGATIVE     ANTIBODY  SCREEN NEGATIVE    POC BLOOD GLUCOSE (RESULTS)   Result Value Ref Range    GLUCOSE, POC 154 (H) 70 - 105 mg/dl       Recent Imaging:  Results for orders placed or performed during the hospital encounter of 04/03/21 (from the past 72 hour(s))   XR AP MOBILE CHEST     Status: None    Narrative    [name]  Male, 76 years old.    XR AP MOBILE CHEST performed on 04/07/2021 10:32 AM.    REASON FOR EXAM:  Concern for pneumonia    TECHNIQUE: 1 views/1 images submitted for interpretation.    COMPARISON:  04/06/2021    FINDINGS:  Stable appearing elevation right hemidiaphragm. Bibasilar opacity most suggestive of atelectasis unchanged from prior. No new focal lung consolidation. Small left-sided effusion. Cardiac contour is unchanged. There is no pneumothorax.      Impression    Small volume left-sided effusion. Bibasilar opacity. No detrimental interval change from prior.   CT CHEST ABDOMEN PELVIS W IV CONTRAST     Status: None    Narrative    [name]  Male, 76 years old.    CT CHEST ABDOMEN PELVIS W IV CONTRAST performed on 04/08/2021  1:22 PM.    REASON FOR EXAM:  Elevated WBC    TECHNIQUE: Axial CT images were obtained through the chest, abdomen, and pelvis following the intravenous administration of contrast. Coronal and sagittal reformatted images were created.    RADIATION DOSE: 1343.80 mGycm  CONTRAST: 83 mL of Isovue 370    COMPARISON: CT of the chest, abdomen, and pelvis performed March 31, 2021.    FINDINGS:     CHEST:    LUNGS: Bibasilar atelectasis is seen in association with bilateral pleural effusions, right greater than left. The large central airways are patent.    HEART/MEDIASTINUM: The heart is within normal limits in size. The thoracic aorta is within normal limits in caliber and contour. There is redemonstration of subocclusive filling defects involving the right main pulmonary artery as well as several segmental right-sided branches, similar to March 31, 2021.    PLEURA: Small bilateral, partially loculated pleural effusions are present, right greater than left.    LYMPH NODES: No abnormally enlarged lymph nodes are detected in the chest.    BONES/SOFT TISSUES: Diminished bone mineral density is noted. No acute bony abnormalities are detected within the chest.      ABDOMEN/PELVIS:     LIVER: The liver is within normal limits in size. There are extensive smooth lobular contour changes of the liver due to a large amount of complex perihepatic fluid which demonstrates areas of increased density as well as apparent septations. Some of this complex fluid extends inferiorly into the right abdomen and along the gallbladder fossa. This large collection measures up to at least 17 cm in its greatest craniocaudal extent.     BILIARY SYSTEM/GALLBLADDER: The gallbladder is normal in size. No intra or extrahepatic bile duct dilation is present.    PANCREAS: The patient is status post recent distal pancreatectomy. There is a moderate amount of complex fluid seen along the suture line at the distal pancreatectomy bed, series 15 image 43,  measuring 7.3 x 5.8 cm. This fluid is likely contiguous with the low-density fluid in the splenectomy bed.    KIDNEY/URETERS: The kidneys are normal in size with symmetric parenchymal enhancement. No hydronephrosis or solid renal mass is present. There are small low-density renal masses bilaterally  which are too small to definitively characterize but most likely represent small cysts.    ADRENALS: Within normal limits.    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 There is some loculated fluid in the left upper quadrant with an indwelling surgical drain in this region.    BOWEL: The stomach is incompletely distended. There is extrinsic compression on the distal stomach and proximal duodenum due to the complex fluid collections in the upper abdomen described above. No abnormally dilated loops of bowel are distinct transition points are detected to suggest bowel obstruction.    VASCULATURE/LYMPH NODES: The abdominal aorta is normal in caliber and contour. The portal and superior mesenteric veins are patent.    BLADDER: The bladder is mildly distended. No bladder wall thickening is visible.    REPRODUCTIVE ORGANS: The prostate appears normal in size.    PERITONEAL CAVITY: As noted above, there is complex multiloculated fluid with increased density seen predominantly in the right upper quadrant. Additional lower density ill-defined fluid is noted in the lesser sac and left upper quadrant. These collections are as described above.    BONES/SOFT TISSUES: Diffuse body wall edema is present. 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 There are mild multilevel degenerative changes of the lumbar spine. No acute or destructive bony abnormality is seen.      Impression    1. Extensive multiloculated, increased density perihepatic and right upper quadrant fluid collection with an imaging appearance most concerning for evolving hematoma/hemoperitoneum given the setting of recent  hemorrhage. No internal air is visible within this collection to specifically suggest superimposed infection.    2. Additional complex fluid collections centered at the recent distal pancreatectomy bed and within the splenectomy bed could also represent evolving postoperative fluid, although, raises concern for a pancreatic leak in the appropriate context.    3. Redemonstration of a subocclusive, subsegmental right-sided pulmonary emboli, similar to March 31, 2021.    4. Small, partially loculated bilateral pleural effusions with bibasilar atelectasis.     CT ANGIO ABDOMEN PELVIS W/WO IV CONTRAST     Status: None (Preliminary result)    Narrative    CT ANGIO ABDOMEN PELVIS W/WO IV CONTRAST performed on 04/09/2021 11:49 AM    INDICATION: 76 years old Male  Abdominal bleeding    TECHNIQUE: Axial images from lung bases through upper thighs followed by intravenous administration of nonionic contrast, with additional multiplanar images in early arterial and venous phases. Arterial coronal and sagittal reformats utilize MIP reconstruction techniques    CONTRAST: 100 cc of Isovue 370    RADIATION DOSE: 2987.10 mGycm    COMPARISON: CT chest abdomen pelvis 04/08/2021.    FINDINGS:  VASCULAR: The abdominal aorta is of normal caliber.  The celiac, superior mesenteric, and inferior mesenteric arteries are patent.  The bilateral renal arteries are patent.  The iliofemoral vessels are normal caliber.  Mild atherosclerotic disease is present. Note is made of a pulmonary embolism involving the right upper and lower lobes. This was present on CT chest abdomen pelvis 03/31/2020. The portal vein and IVC are patent.    NONVASCULAR:  Lung Bases: Greater than right pleural effusions with associated atelectasis are identified.    Liver: Diffuse fatty change of the liver is present.   There is redemonstration of a loculated, complex, perihepatic collection resulting in mass effect on the hepatic parenchyma. Overall this is similar in appearance  relative to CT 04/08/2021.    Gallbladder/Biliary System: No hyperattenuating foci  are present within the gallbladder lumen.  No bile duct dilation is seen.    Spleen: The spleen is surgically absent. Nonloculated fluid as well as surgical train are present within the left upper quadrant.    Pancreas: There are postoperative changes from distal pancreatectomy with postoperative fluid similar in appearance relative to comparison study.    Adrenals: The adrenal glands are grossly unremarkable.    Kidney/Ureters/Bladder: The kidneys are symmetric in size. Bilateral simple to minimally complex cysts are again noted. There is no evidence of hydronephrosis. Mild bilateral perinephric stranding is unchanged. The ureters are of normal caliber.  The bladder is partially distended limiting evaluation..    Reproductive Organs: The prostate is normal in size.    Bowel: There is narrowing of the pylorus and first portion of the duodenum secondary to extrinsic compression by multiple abdominal fluid collections. This is unchanged. Fluid collection along the greater curvature is redemonstrated and unchanged. Enteric contrast is noted throughout the large bowel and present to the level the rectum. No abnormally dilated loops of bowel are identified to suggest obstruction.    Lymph Nodes: No suspicious lymph nodes are seen throughout the abdomen and pelvis.    Peritoneal Cavity: Multiple complex fluid collections within the right upper quadrant as well as fluid throughout the mid abdomen and left upper quadrant are present and described above. A small amount of residual pneumoperitoneum is noted and decreased relative to comparison study. Postoperative changes along the abdominal midline wall are present.    Soft Tissues: Anasarca.    Bones: Vertebral body height and alignment is maintained. Bilateral femoral head avascular necrosis is noted.        Impression    1.No acute bleed.  2.Stable multiloculated right perihepatic collection  and fluid collections within the mid abdomen and left upper quadrant.  3.Partially imaged subocclusive, segmental, right pulmonary emboli previously demonstrated on CT 03/31/2021.  4.Right greater than left pleural effusion and atelectasis.         Assessment/ Plan:  Active Hospital Problems    Diagnosis   . Primary Problem: Intra abdominal hemorrhage       [name] is a 76 y.o. male who is Day of Surgery S/P Procedure(s) (LRB):  IR EMBOLIZATION/BODY (N/A)  IR ABSCESS/FLUID DRAINAGE OF(PAFD) (N/A)    Lines: R PIV, R midline     NEURO:  GCS: E4=Spontaneous (Opens Eyes on Own) M6=Normal (Follows Simple Commands) V5=Normal Conversation  Imaging: None  Neurochecks q 4 hr    SBP < 160  Sedation/analgesia: Tylenol, Dilaudid, Robaxin, Oxy 5/10, Neurontin     Plan:  - Continue current pain regimen     CARDIOVASCULAR:  Systolic (60FUX), NAT:557 , Min:115 , DUK:025     Diastolic (42HCW), CBJ:62, Min:60, Max:99     Troponins: No results found for: CKMB   Meds: Atorvastatin   Pressors: None    Plan:  - Continue home Atorvastatin           PULMONARY:  Airway Ventilator Settings     Not on Ventilator   SpO2  Avg: 91.2 %  Min: 87 %  Max: 96 %  Blood Gas:  No results found for this encounter  Nebs: None  Imaging: None    Plan:   - Currently on 5L NC, wean as tolerated    GI:  MNT PROTOCOL FOR DIETITIAN  ROOM SERVICE:  NEEDS VISIT FOR MENU CHOICES  MNT PROTOCOL FOR DIETITIAN  DIET NPO - NOW STRICT, EXCEPT ALL MEDS  WITH SIPS OF WATER   Recent Labs     04/08/21  0652 04/09/21  0342   BILIRUBIN Negative  --    ALBUMIN  --  1.6*     Last BM: Date of Last Bowel Movement: 04/09/21  Proph: Colace, Senokot, Protonix     Plan:  -Suspected GI bleed s/p embolization and drain placement with IR         RENAL/GU:  Recent Labs     04/07/21  0440 04/08/21  0402 04/08/21  0652 04/09/21  0342   SODIUM 136 134*  --  134*   POTASSIUM 4.2 4.5  --  4.8   CHLORIDE 104 101  --  99   BUN 20 16  --  14   CREATININE 0.82 0.69*  --  0.74*   GLUCOSE   --   --  Negative  --    ANIONGAP 8 6  --  10   CALCIUM 8.7* 8.7*  --  8.9   MAGNESIUM 2.1 2.0  --  2.0   PHOSPHORUS 2.4 2.3  --  2.3       I/O last 24 hours:      Intake/Output Summary (Last 24 hours) at 04/09/2021 1610  Last data filed at 04/09/2021 1525  Gross per 24 hour   Intake 1300 ml   Output 1928 ml   Net -628 ml     I/O current shift:  03/10 0700 - 03/10 1859  In: 1200 [I.V.:1000]  Out: 1180 [Urine:550; Drains:630]  Foley in critically ill pt for strict I/O's  IV fluids: Plasmalyte @ 75 ml/hr    Plan:  - Continue mIVF    HEME:  Recent Labs     04/07/21  0440 04/07/21  1301 04/08/21  0402 04/08/21  1007 04/08/21  1630 04/08/21  1718 04/08/21  2351 04/09/21  0342 04/09/21  1017   HGB 9.3*  --  9.7*  --   --   --   --  12.0* 9.3*   HCT 28.5*  --  29.2*  --   --   --   --  37.3* 28.5*   PLTCNT 367  --  392  --   --   --   --  434*  --    APTT 40.2*   < > 54.8*   < > 98.1* 58.2* 52.7*  --   --     < > = values in this interval not displayed.     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    SSI required 2U in last 24 hrs  MSK:  No skin breakdown    OTHER:  Activity: Bedrest, HOB 30deg  PT/OT:  ordered  MNT:  ordered    PLAN:  - Continue current pain regimen   - Continue mIVF  - Holding heparin   - Type and crossmatched for 2U, having active bloody output from RUQ IR drain   - Trending Q4 H&H   - Transfuse as  indicated   - Continue current ABX  - Continue home HIV therapy  - Await results of Blood cultures        Theora Gianotti, DO 04/09/2021, 16:10  [place]   PGY-1 General Surgery Resident   Pager [place] 04/09/21    Pt with acute bleed from abdomen with increased drain output today.  Nothing active seen in IR but empirically embolized and off heparin gtt.  Follow serial h/h, follow teg.   Gram negative bacteremia on cef and vanco, repeat cultures.    I was present at the bedside of this critically ill patient for 45 minutes exclusive of procedures.  This patient suffers from failure or dysfunction of 4 system(s).  The care of this patient was in regard to managing (a) conditions(s) that has a high probability of sudden, clinically significant, or life-threatening deterioration and required a high degree of Attending Physician attention and direct involvement to intervene urgently. Data review and care planning was performed in direct proximity of the patient, examination was obviously performed in direct contact with the patient. All of this time was exclusive of procedure which will be documented elsewhere in the chart.    My critical care time involved full attention to the patients' condition and included:    Review of nursing notes and/or old charts  Review of medications, allergies, and vital signs  Documentation time  Consultant collaboration on findings and treatment options  Care, transfer of care, and discharge plans  Ordering, interpreting, and reviewing diagnostic studies/tab tests  Obtaining necessary history from family, EMS, nursing home staff and/or treating physicians    My critical care time did not include time spent teaching resident physician(s) or other services of resident physicians, or performing other reported procedures.  Total Critical Care Time: 45 minutes    Electronically Signed:   Larna Daughters, MD 04/09/2021, 17:28      I saw and examined the patient.  I  reviewed the resident's note.  I agree with the findings and plan of care as documented in the resident's note.  Any exceptions/additions are edited/noted.    Larna Daughters, MD

## 2021-04-09 NOTE — Consults (Signed)
PATIENT NAME: [name] NUMBER:  [id number]  DATE OF SERVICE: 04/09/2021  DATE OF BIRTH:  23-Dec-1945    CONSULTATION    Length of hospital stay:  6 days.    INFECTIOUS DISEASE INITIAL CONSULTATION    SERVICE:  Surgical Oncology.    REQUESTING PHYSICIAN:  Lynwood Dawley, MD.    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  He developed postoperative complications with bleeding requiring readmission to [facility] as of April 03, 2021, status post exploratory laparotomy with ligation of bleeding vessels on April 03, 2021.  Postoperative course complicated by rising leukocytosis, now identified to have multiloculated right upper quadrant fluid collection in addition to complex collection at the pancreatic bed.  The patient underwent IR-guided right upper quadrant peritoneal drain placement as well as angiogram without evidence of active bleeding.  They performed empiric embolization of splenic artery stump and left inferior adrenal artery.  Cultures are currently pending.  The patient was identified to have gram-negative rods bacteremia. Suspect intraabdominal source with possible infected hematoma. Would recommend ongoing empiric antimicrobial therapy. As positive culture was obtained from midline would recommend explant with concern for CLABSI.     1. Continue to follow pending culture results.  2. Recommend repeating blood cultures x2 sets tomorrow.  3. Explant midline.  4. Obtain culture from newly placed RUQ IR drain.   5. Continue vancomycin dosed per pharmacy per protocol, target AUC of 400 to 600.  6. Continue cefepime 2 grams IV q.12 hours.  7. Start metronidazole 500 mg p.o.  b.i.d.  8. We will continue to follow culture data and adjust antimicrobial therapy as warranted.   9. Continue Genvoya.   9. Continue to follow CBC with differential, BMP, LFT panel, and CRP.    Thank you for this consultation.  We will continue to follow the patient. Please call if there are any questions.    Information was obtained from patient and per EMR.    REASON FOR CONSULTATION:  Worsening leukocytosis and gram-negative rod bacteremia.    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  He additionally has newly diagnosed IDPM requiring multiple hospitalizations.  Reportedly, the patient was initially identified to have a pancreatic mass when he underwent colectomy for perforated diverticulitis in 2006.  It has been stable and they have been following this lesion.  However, it has been acutely worsening for which the patient has been following with Surgical Oncology at [place].  He underwent EGD on February 08, 2021, with biopsy, which was concerning for mucinous cysts with low-grade atypia.  He ultimately underwent open distal pancreatectomy and splenectomy on March 22, 2021.  Pathology was consistent for IDPM.  Postoperative course was complicated by right lower lobe pulmonary embolism for which he was started on Eliquis.     However, he had increasing bleeding around his JP drain prompting evaluation at [facility]. CTA at the outside facility was positive for acute extravasation at the resection pancreatic site. He went to the OR on April 03, 2021, for exploratory laparotomy with ligation of the bleeding  vessels and received cefazolin perioperatively. He was subsequently transferred to [facility] on April 03, 2021. He was stable postoperative.  However, over the last 48 hours, he  has had worsening leukocytosis, now 42,000, but otherwise stable oxygen demands requiring approximately 5 or 6 liters throughout this hospitalization.  Repeat workup including blood cultures x2 sets on April 08, 2021, are positive in 1 set obtained from the midline for gram-negative rod.  Additionally, repeat CT scan is positive for multiloculated perihepatic fluid collection and complex collection at the pancreatic bed.  A subsequent CTA is without evidence of acute bleed.  Interventional Radiology performed a right upper quadrant drain placement in addition to an angiogram without acute active bleeding.  However, they did empiric embolization of the splenic artery stump and left inferior adrenal artery. No cultures were obtained from drain placement.  Following the procedure, the patient had worsening bleeding from his drains prompting admission to the surgical ICU. Patient is complaining of abdominal pain. Majority of information obtain from wife at bedside.     PAST MEDICAL HISTORY:  1. Hypertension.  2. Hyperlipidemia.  3. Prostate cancer diagnosed in 2017 status post radiation.  4. Hypothyroidism.  5. Perforated diverticulitis status post colectomy in 2006.  6. IDPM.  7. Pulmonary embolism on Eliquis.  8.  DVT diagnosed spring, 2022. Treated with a 3 month course of Eliquis.   9. HIV on Genvoya. Diagnosed >20 years ago. Follows with the [place].    10. 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   11.  MRSA bacteremia diagnosed in December 2022 stemming from left groin abscess status post I and D and completion of a 2-week course of vancomycin.    PAST SURGICAL HISTORY:  1. I and D of left groin.  2. Partial colectomy with colostomy in 2006 with subsequent colostomy reversal.  3. EGD.  4. Right hip I&D 03/2020  5. C-spine surgery in 2001.  6. Bilateral knee surgery.  7. Right wrist surgery.  8. Hernia repair.  9. Distal pancreatectomy and  splenectomy.    FAMILY HISTORY:  Family Medical History:       Problem Relation (Age of Onset)    Cancer Mother, Father, Other    Heart Attack Mother    High Cholesterol Other    Stroke Other          ALLERGIES:  No known antimicrobial allergies.    MEDICATIONS:  Current Facility-Administered Medications   Medication Dose Route Frequency    acetaminophen (TYLENOL) tablet  650 mg Oral Q4H    atorvastatin (LIPITOR) tablet  40 mg Oral Daily with Breakfast    benzonatate (TESSALON) capsule  100 mg Oral Q8H PRN    cefepime (MAXIPIME) 2 g in NS 100 mL IVPB minibag  2 g Intravenous Q12H    D5W 250 mL flush bag   Intravenous Q15 Min PRN    diatrizoate meglumine & sodium oral solution  8 mL Oral Give in Radiology    docusate sodium (COLACE) capsule  100 mg Oral 2x/day    electrolyte-A (PLASMALYTE-A) premix infusion   Intravenous Continuous    elvitegravir-cobicistat-emtricitrabine-tenofovir (GENVOYA) 150-150-200-10 mg per tablet  1 Tablet Oral Daily    gabapentin (NEURONTIN) capsule  300 mg Oral 3x/day    [Held by provider] heparin 25,000 units in NS 250 mL infusion  12 Units/kg/hr (Adjusted) Intravenous Continuous    HYDROmorphone (DILAUDID) 1 mg/mL injection  0.2 mg Intravenous  Q3H PRN    iopamidol (ISOVUE-370) 76% infusion  100 mL Intravenous Give in Radiology    levothyroxine (SYNTHROID) tablet  88 mcg Oral QAM    methocarbamol (ROBAXIN) tablet  1,000 mg Oral 4x/day    NS 250 mL flush bag   Intravenous Q15 Min PRN    NS flush syringe  2-6 mL Intracatheter Q8HRS    NS flush syringe  2-6 mL Intracatheter Q1 MIN PRN    NS flush syringe  10-30 mL Intracatheter Q8HRS    NS flush syringe  20-30 mL Intracatheter Q1 MIN PRN    ondansetron (ZOFRAN) 2 mg/mL injection  4 mg Intravenous Q6H PRN    oxyCODONE (ROXICODONE) immediate release tablet  5 mg Oral Q4H PRN    oxyCODONE (ROXICODONE) immediate releaste tablet  10 mg Oral Q4H PRN    pantoprazole (PROTONIX) delayed release tablet  40 mg Oral Daily before Breakfast    scopolamine  1 mg over 3 days transdermal patch  1 Patch Transdermal Q72H    sennosides-docusate sodium (SENOKOT-S) 8.6-50mg per tablet  1 Tablet Oral 2x/day    SSIP insulin R human (HUMULIN R) 100 units/mL injection  0-12 Units Subcutaneous Q6H PRN    vancomycin (VANCOCIN) 1 g in D5W 200 mL premix IVPB  15 mg/kg (Adjusted) Intravenous Q12H    Vancomycin IV - Pharmacist to Dose per Protocol   Does not apply Daily PRN     ANTIBIOTICS:  1. Cefazolin perioperatively April 03, 2021.  2. Vancomycin and cefepime started April 22, 2021, through present.    LINES:  Patient Lines/Drains/Airways Status       Active Line / Dialysis Catheter / Dialysis Graft / Drain / Airway / Wound       Name Placement date Placement time Site Days    Peripheral IV Ultrasound guided Proximal;Right Basilic  (medial side of arm) 04/06/21  0958  -- 3    Peripheral IV Ultrasound guided Anterior;Distal;Left;Upper Arm 04/09/21  1023  -- less than 1    Peripheral IV Ultrasound guided Distal;Right Forearm 04/09/21  1030  -- less than 1    Midline Double Lumen Lumen 1 Red Lumen 2 Purple Right;Basilic Vein NOT A CENTRAL LINE 04/07/21  1745  4 FR  1    [employer] Drain Right;Upper Abdomen 04/03/21  1055  -- 6    Drain (Miscellaneous) #45F pigtail catheter for RLQ fluid collection Lower;Right;Anterior Abdomen 04/09/21  1433  -- less than 1    Surgical Incision Abdomen 04/03/21  1046  -- 6    Surgical Incision Other (Comment) Left;Upper Abdomen 04/03/21  1400  -- 6                  SOCIAL HISTORY:  The patient lives in [place], with his wife of 8 years. Previously widowed. Retired. Previously worked at [employer]. No tobacco or alcohol use. No IVDU history.     IMMUNIZATIONS:  Immunization History   Administered Date(s) Administered    HAEMOPHILUS B CONJUGATE VACCINE 03/04/2021    HEPATITIS B VIRUS RECOMB VACCINE 10/11/2013, 11/18/2013, 02/18/2014    High-Dose Influenza Vaccine, 65+ 11/06/2018    Influenza Vaccine, 6 month-adult 11/18/2013,  10/27/2014, 10/16/2015, 10/17/2016, 11/03/2017    Meningococcal B Vaccine - 2 Dose (Bexsero) 03/04/2021    Meningococcal Vaccine (Menveo) (Admin) 03/04/2021    PREVNAR 13 12/14/2012, 01/14/2014    Shingrix - Zoster Vaccine 02/27/2018    VAXNEUVANCE(PCV 15) 03/04/2021     REVIEW OF SYSTEMS:  ROS is negative except for what is listed in HPI.     PHYSICAL EXAMINATION:  Filed Vitals:    04/09/21 1610 04/09/21 1615 04/09/21 1652 04/09/21 1700   BP: 138/74  135/81 134/75   Pulse: (!) 103 (!) 103 (!) 105 (!) 105   Resp: (!) 22 (!) '23 18 16   '$ Temp:   36.7 C (98.1 F)    SpO2: 93% 94% 92% 93%     Constitutional: Acutely ill gentleman. Non-toxic.   Neurologic: Lethargic but alert and oriented to person, place, and time.   Eyes: Sclera non-icteric, conjunctiva non-injected.   HENT: Buccal mucosa moist. Healthy dentition present.  Respiratory: Respirations are non labored on room air. Full breath sounds bilaterally without crackles or wheeze on 5 L NC.    Cardiovascular: Heart rate is tachycardic without murmur.    Gastrointestinal: Midline incision site with staples in place. Well approximated without drainage. Drain to the RUQ x 2 both producing sanguinous fluid, non-turbid. Normoactive bowel sounds present.  Musculoskeletal: Joints without erythema, edema, or warmth.   Integumentary: No diffuse rashes. Skin is warm and non-diaphoretic.      LABORATORY DATA:  Results for orders placed or performed during the hospital encounter of 04/03/21 (from the past 24 hour(s))   MRSA SCREEN, PCR, RAPID    Specimen: Nasal Swab   Result Value Ref Range    MRSA COLONIZATION SCREEN Positive (A) Negative    Narrative    This PCR assay is performed using the FDA-cleared Xpert MRSA NxG test on the GeneExpert Dx System [facility]).   PTT (PARTIAL THROMBOPLASTIN TIME)   Result Value Ref Range    APTT 52.7 (H) 24.2 - 37.5 seconds    Narrative    Therapeutic range for unfractionated heparin is 60-100 seconds.   BASIC METABOLIC PANEL   Result Value  Ref Range    SODIUM 134 (L) 136 - 145 mmol/L    POTASSIUM 4.8 3.5 - 5.1 mmol/L    CHLORIDE 99 96 - 111 mmol/L    CO2 TOTAL 25 23 - 31 mmol/L    ANION GAP 10 4 - 13 mmol/L    CALCIUM 8.9 8.8 - 10.2 mg/dL    GLUCOSE 98 65 - 125 mg/dL    BUN 14 8 - 25 mg/dL    CREATININE 0.74 (L) 0.75 - 1.35 mg/dL    BUN/CREA RATIO 19 6 - 22    ESTIMATED GFR >90 >=60 mL/min/BSA   CBC/DIFF    Narrative    The following orders were created for panel order CBC/DIFF.  Procedure                               Abnormality         Status                     ---------                               -----------         ------                     CBC WITH YTWK[[id number]]                Abnormal            Final result  MANUAL DIFF AND MORPHOLO.Marland KitchenMarland Kitchen[[id number]]  Abnormal            Final result                 Please view results for these tests on the individual orders.   MAGNESIUM   Result Value Ref Range    MAGNESIUM 2.0 1.8 - 2.6 mg/dL   PHOSPHORUS   Result Value Ref Range    PHOSPHORUS 2.3 2.3 - 4.0 mg/dL   CBC WITH DIFF   Result Value Ref Range    WBC 42.5 (H) 3.7 - 11.0 x103/uL    RBC 4.07 (L) 4.50 - 6.10 x106/uL    HGB 12.0 (L) 13.4 - 17.5 g/dL    HCT 37.3 (L) 38.9 - 52.0 %    MCV 91.6 78.0 - 100.0 fL    MCH 29.5 26.0 - 32.0 pg    MCHC 32.2 31.0 - 35.5 g/dL    RDW-CV 16.1 (H) 11.5 - 15.5 %    PLATELETS 434 (H) 150 - 400 x103/uL    MPV 11.6 8.7 - 12.5 fL   MANUAL DIFF AND MORPHOLOGY-SYSMEX   Result Value Ref Range    NEUTROPHIL % 93 %    LYMPHOCYTE %  1 %    MONOCYTE % 6 %    EOSINOPHIL % 0 %    BASOPHIL % 0 %    NEUTROPHIL BANDS % 0 %    METAMYELOCYTE %  0 %    NEUTROPHIL # 39.53 (H) 1.50 - 7.70 x103/uL    LYMPHOCYTE # 0.43 (L) 1.00 - 4.80 x103/uL    MONOCYTE # 2.55 (H) 0.20 - 1.10 x103/uL    EOSINOPHIL # <0.10 <=0.50 x103/uL    BASOPHIL # <0.10 <=0.20 x103/uL    NRBC FROM MANUAL DIFF 7 per 100 WBC    ANISOCYTOSIS 2+/Moderate (A) None    POLYCHROMASIA 2+/Moderate (A) None   H & H   Result Value Ref Range    HGB 9.3 (L)  13.4 - 17.5 g/dL    HCT 28.5 (L) 38.9 - 52.0 %   TEG, RAPID GLOBAL WITH LYSIS (TRAUMA)   Result Value Ref Range    R (CK) 6.4 4.6 - 9.1 min    LYS30 (%) 0.0 0.0 - 2.6 %    MA (CRT RAPID) >75.0 (H) 52.0 - 70.0 mm    MA FIBRINOGEN (CFF) >52.0 (H) 15.0 - 32.0 mm   HEPATIC FUNCTION PANEL   Result Value Ref Range    ALBUMIN 1.6 (L) 3.4 - 4.8 g/dL     ALKALINE PHOSPHATASE 253 (H) 45 - 115 U/L    ALT (SGPT) 52 10 - 55 U/L    AST (SGOT)  56 (H) 8 - 45 U/L    BILIRUBIN TOTAL 1.0 0.3 - 1.3 mg/dL    BILIRUBIN DIRECT 0.5 (H) 0.1 - 0.4 mg/dL    PROTEIN TOTAL 5.4 (L) 6.0 - 8.0 g/dL   LACTIC ACID LEVEL W/ REFLEX FOR LEVEL >2.0   Result Value Ref Range    LACTIC ACID 0.8 0.5 - 2.2 mmol/L   CBC/DIFF    Narrative    The following orders were created for panel order CBC/DIFF.  Procedure                               Abnormality         Status                     ---------                               -----------         ------  CBC WITH DIFF[[id number]]                                    In process                   Please view results for these tests on the individual orders.   BASIC METABOLIC PANEL   Result Value Ref Range    SODIUM 133 (L) 136 - 145 mmol/L    POTASSIUM 4.6 3.5 - 5.1 mmol/L    CHLORIDE 100 96 - 111 mmol/L    CO2 TOTAL 25 23 - 31 mmol/L    ANION GAP 8 4 - 13 mmol/L    CALCIUM 8.1 (L) 8.8 - 10.2 mg/dL    GLUCOSE 109 65 - 125 mg/dL    BUN 14 8 - 25 mg/dL    CREATININE 0.71 (L) 0.75 - 1.35 mg/dL    BUN/CREA RATIO 20 6 - 22    ESTIMATED GFR >90 >=60 mL/min/BSA   PHOSPHORUS   Result Value Ref Range    PHOSPHORUS 2.2 (L) 2.3 - 4.0 mg/dL   MAGNESIUM   Result Value Ref Range    MAGNESIUM 2.0 1.8 - 2.6 mg/dL   TROPONIN-I   Result Value Ref Range    TROPONIN I <7 0 - 30 ng/L   TYPE AND CROSS RED CELLS - UNITS , 2 Units   Result Value Ref Range    UNITS ORDERED 6     SPECIMEN EXPIRATION DATE 04/12/2021,2359     ABO/RH(D) A NEGATIVE     ANTIBODY SCREEN NEGATIVE    POC BLOOD GLUCOSE (RESULTS)   Result Value Ref  Range    GLUCOSE, POC 122 (H) 70 - 105 mg/dl   POC BLOOD GLUCOSE (RESULTS)   Result Value Ref Range    GLUCOSE, POC 109 (H) 70 - 105 mg/dl   POC BLOOD GLUCOSE (RESULTS)   Result Value Ref Range    GLUCOSE, POC 154 (H) 70 - 105 mg/dl   POC BLOOD GLUCOSE (RESULTS)   Result Value Ref Range    GLUCOSE, POC 117 (H) 70 - 105 mg/dl     MICROBIOLOGY DATA:  1. Blood cultures x2 sets obtained April 08, 2021, are positive in 1 out of 2 sets for gram-negative rod.  Positive set was obtained from the midline.  2. MRSA nares PCR is positive.    IMAGING DATA:  Results for orders placed or performed during the hospital encounter of 04/03/21 (from the past 72 hour(s))   XR AP MOBILE CHEST     Status: None    Narrative    MANRAJ YEO Mccrumb  Male, 76 years old.    XR AP MOBILE CHEST performed on 04/07/2021 10:32 AM.    REASON FOR EXAM:  Concern for pneumonia    TECHNIQUE: 1 views/1 images submitted for interpretation.    COMPARISON:  04/06/2021    FINDINGS:  Stable appearing elevation right hemidiaphragm. Bibasilar opacity most suggestive of atelectasis unchanged from prior. No new focal lung consolidation. Small left-sided effusion. Cardiac contour is unchanged. There is no pneumothorax.      Impression    Small volume left-sided effusion. Bibasilar opacity. No detrimental interval change from prior.   CT CHEST ABDOMEN PELVIS W IV CONTRAST     Status: None    Narrative    RAYMONDO GARCIALOPEZ Sobczak  Male, 75 years old.    CT CHEST ABDOMEN  PELVIS W IV CONTRAST performed on 04/08/2021 1:22 PM.    REASON FOR EXAM:  Elevated WBC    TECHNIQUE: Axial CT images were obtained through the chest, abdomen, and pelvis following the intravenous administration of contrast. Coronal and sagittal reformatted images were created.    RADIATION DOSE: 1343.80 mGycm  CONTRAST: 83 mL of Isovue 370    COMPARISON: CT of the chest, abdomen, and pelvis performed March 31, 2021.    FINDINGS:     CHEST:    LUNGS: Bibasilar atelectasis is seen in association with bilateral  pleural effusions, right greater than left. The large central airways are patent.    HEART/MEDIASTINUM: The heart is within normal limits in size. The thoracic aorta is within normal limits in caliber and contour. There is redemonstration of subocclusive filling defects involving the right main pulmonary artery as well as several segmental right-sided branches, similar to March 31, 2021.    PLEURA: Small bilateral, partially loculated pleural effusions are present, right greater than left.    LYMPH NODES: No abnormally enlarged lymph nodes are detected in the chest.    BONES/SOFT TISSUES: Diminished bone mineral density is noted. No acute bony abnormalities are detected within the chest.      ABDOMEN/PELVIS:     LIVER: The liver is within normal limits in size. There are extensive smooth lobular contour changes of the liver due to a large amount of complex perihepatic fluid which demonstrates areas of increased density as well as apparent septations. Some of this complex fluid extends inferiorly into the right abdomen and along the gallbladder fossa. This large collection measures up to at least 17 cm in its greatest craniocaudal extent.     BILIARY SYSTEM/GALLBLADDER: The gallbladder is normal in size. No intra or extrahepatic bile duct dilation is present.    PANCREAS: The patient is status post recent distal pancreatectomy. There is a moderate amount of complex fluid seen along the suture line at the distal pancreatectomy bed, series 15 image 43, measuring 7.3 x 5.8 cm. This fluid is likely contiguous with the low-density fluid in the splenectomy bed.    KIDNEY/URETERS: The kidneys are normal in size with symmetric parenchymal enhancement. No hydronephrosis or solid renal mass is present. There are small low-density renal masses bilaterally which are too small to definitively characterize but most likely represent small cysts.    ADRENALS: Within normal limits.    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 There is  some loculated fluid in the left upper quadrant with an indwelling surgical drain in this region.    BOWEL: The stomach is incompletely distended. There is extrinsic compression on the distal stomach and proximal duodenum due to the complex fluid collections in the upper abdomen described above. No abnormally dilated loops of bowel are distinct transition points are detected to suggest bowel obstruction.    VASCULATURE/LYMPH NODES: The abdominal aorta is normal in caliber and contour. The portal and superior mesenteric veins are patent.    BLADDER: The bladder is mildly distended. No bladder wall thickening is visible.    REPRODUCTIVE ORGANS: The prostate appears normal in size.    PERITONEAL CAVITY: As noted above, there is complex multiloculated fluid with increased density seen predominantly in the right upper quadrant. Additional lower density ill-defined fluid is noted in the lesser sac and left upper quadrant. These collections are as described above.    BONES/SOFT TISSUES: Diffuse body wall edema is present. 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 There are mild multilevel degenerative changes of the lumbar spine. No acute or destructive bony abnormality is seen.      Impression    1. Extensive multiloculated, increased density perihepatic and right upper quadrant fluid collection with an imaging appearance most concerning for evolving hematoma/hemoperitoneum given the setting of recent hemorrhage. No internal air is visible within this collection to specifically suggest superimposed infection.    2. Additional complex fluid collections centered at the recent distal pancreatectomy bed and within the splenectomy bed could also represent evolving postoperative fluid, although, raises concern for a pancreatic leak in the appropriate context.    3. Redemonstration of a subocclusive, subsegmental right-sided pulmonary emboli, similar to March 31, 2021.    4.  Small, partially loculated bilateral pleural effusions with bibasilar atelectasis.     CT ANGIO ABDOMEN PELVIS W/WO IV CONTRAST     Status: None (Preliminary result)    Narrative    CT ANGIO ABDOMEN PELVIS W/WO IV CONTRAST performed on 04/09/2021 11:49 AM    INDICATION: 76 years old Male  Abdominal bleeding    TECHNIQUE: Axial images from lung bases through upper thighs followed by intravenous administration of nonionic contrast, with additional multiplanar images in early arterial and venous phases. Arterial coronal and sagittal reformats utilize MIP reconstruction techniques    CONTRAST: 100 cc of Isovue 370    RADIATION DOSE: 2987.10 mGycm    COMPARISON: CT chest abdomen pelvis 04/08/2021.    FINDINGS:  VASCULAR: The abdominal aorta is of normal caliber.  The celiac, superior mesenteric, and inferior mesenteric arteries are patent.  The bilateral renal arteries are patent.  The iliofemoral vessels are normal caliber.  Mild atherosclerotic disease is present. Note is made of a pulmonary embolism involving the right upper and lower lobes. This was present on CT chest abdomen pelvis 03/31/2020. The portal vein and IVC are patent.    NONVASCULAR:  Lung Bases: Greater than right pleural effusions with associated atelectasis are identified.    Liver: Diffuse fatty change of the liver is present.   There is redemonstration of a loculated, complex, perihepatic collection resulting in mass effect on the hepatic parenchyma. Overall this is similar in appearance relative to CT 04/08/2021.    Gallbladder/Biliary System: No hyperattenuating foci are present within the gallbladder lumen.  No bile duct dilation is seen.    Spleen: The spleen is surgically absent. Nonloculated fluid as well as surgical train are present within the left upper quadrant.    Pancreas: There are postoperative changes from distal pancreatectomy with postoperative fluid similar in appearance relative to comparison study.    Adrenals: The adrenal glands are  grossly unremarkable.    Kidney/Ureters/Bladder: The kidneys are symmetric in size. Bilateral simple to minimally complex cysts are again noted. There is no evidence of hydronephrosis. Mild bilateral perinephric stranding is unchanged. The ureters are of normal caliber.  The bladder is partially distended limiting evaluation..    Reproductive Organs: The prostate is normal in size.    Bowel: There is narrowing of the pylorus and first portion of the duodenum secondary to extrinsic compression by multiple abdominal fluid collections. This is unchanged. Fluid collection along the greater curvature is redemonstrated and unchanged. Enteric contrast is noted throughout the large bowel and present to the level the rectum. No abnormally dilated loops of bowel are identified to suggest obstruction.    Lymph Nodes: No suspicious lymph nodes are seen throughout the abdomen and pelvis.    Peritoneal Cavity: Multiple  complex fluid collections within the right upper quadrant as well as fluid throughout the mid abdomen and left upper quadrant are present and described above. A small amount of residual pneumoperitoneum is noted and decreased relative to comparison study. Postoperative changes along the abdominal midline wall are present.    Soft Tissues: Anasarca.    Bones: Vertebral body height and alignment is maintained. Bilateral femoral head avascular necrosis is noted.        Impression    1.No acute bleed.  2.Stable multiloculated right perihepatic collection and fluid collections within the mid abdomen and left upper quadrant.  3.Partially imaged subocclusive, segmental, right pulmonary emboli previously demonstrated on CT 03/31/2021.  4.Right greater than left pleural effusion and atelectasis.           I independently of the faculty provider spent a total of 60 minutes in direct/indirect care of this patient including initial evaluation, review of laboratory, radiology, diagnostic studies, review of medical record, order  entry and coordination of care.    [name] Martinez, PA-C  04/09/2021, 17:50    I personally saw and evaluated the patient as part of a shared service with an APP.      My substantive findings are:  Mr. 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  He developed postoperative complications with bleeding requiring readmission to [facility] as of April 03, 2021, status post exploratory laparotomy with ligation of bleeding vessels on April 03, 2021.  Postoperative course complicated by rising leukocytosis, now identified to have multiloculated right upper quadrant fluid collection in addition to complex collection at the pancreatic bed.  The patient underwent IR-guided right upper quadrant peritoneal drain placement as well as angiogram without evidence of active bleeding.  They performed empiric embolization of splenic artery stump and left inferior adrenal artery.  Cultures are currently pending.  The patient was identified to have gram-negative rods bacteremia. Suspect intraabdominal source with possible infected hematoma. Would recommend ongoing empiric antimicrobial therapy. As positive culture was obtained from midline would recommend explant with concern for CLABSI.      Continue antimicrobial therapy with vancomycin and cefepime  - Recommend addition of flagyl if not contraindicated.    - Continue to follow pending cultures   - Continue Genvoya    I independently of the midlevel provider spent a total of (20) minutes was spent in direct/indirect care of this patient including evaluation, review and interpretation of laboratory and  microbiology studies, review of radiology studies and the medical record, order entry and coordination of care, counseling and educating the patient/family/caregiver, and documentation.    Valorie Roosevelt, MD, PhD  Associate Professor  Department of Internal Medicine  Section of Infectious Diseases  [facility]            CC:   Lynwood Dawley, MD  [address]  [place], [place] [address]       DD:  04/09/2021 16:34:40  DT:  04/09/2021 17:09:42 DG  D#:  [id number]

## 2021-04-09 NOTE — Brief Op Note (Signed)
[facility]   Brief Post-Interventional Radiology Procedure Note    Patient Name: [facility] Number: [id number]  Date of Birth: November 24, 1945  Date of Service: 04/09/2021    PROCEDURE:   1. Image-guided RUQ peritoneal drain placement   2. Angiogram and embolization   Attending: Franco Collet, MD  Assistant:  Mali Brady, DO  Preop Diagnosis: Hemoperitoneum   Postop Diagnosis: Same   Anesthesia: Moderate sedation  Estimated Blood Loss: Minimal   Specimens Removed: None  Access:  R CFA, 5Fr sheath  Closure:  Angio-Seal     FINDINGS/INTERVENTIONS/COMPLICATIONS:   Image-guided peritoneal drain placement, 12-French, into RUQ fluid, bloody drainage   No active bleeding on angiography, including gastrosplenic trunk, splenic artery stump, left gastric artery, left inferior phrenic artery, SMA, replaced common hepatic artery, right and left hepatic artery, GDA, left renal artery, left inferior adrenal artery.  Empiric embolization of the splenic artery stump.  Empiric embolization of left interior adrenal artery.    RECOMMENDATIONS & FOLLOW-UP:   Care per primary service.   Detailed note to follow.    Rod Holler, MD  04/09/2021, 15:32

## 2021-04-09 NOTE — Nurses Notes (Signed)
PTT therapeutic per protocol. No changes made at this time. Heparin gtt to be stopped at 0400 per MD to nurse order.         Latest Reference Range & Units 04/08/21 23:51   aPTT 24.2 - 37.5 seconds 52.7 (H)   (H): Data is abnormally high

## 2021-04-09 NOTE — Care Plan (Signed)
04/09/21 1123   Therapist Pager   OT Assigned/ Pager # Abby [phone]   Rehab Session   Document Type rehab contact note   Total OT Minutes: 0   Daily Activity AM-PAC/6-clicks Score   Patient Mobility Barrier Patient off floor for procedure/test  (Off the floor for CT scan)       Patient is not appropriate for OT intervention at this time. Will follow up next available/medically appropriate time. Thanks    Mariann Laster, MOT, OTR/L  Pager [phone]

## 2021-04-09 NOTE — Care Plan (Signed)
04/09/21 1122   Therapist Pager   PT Assigned/ Pager # Sharrie Rothman [phone]   Rehab Session   Document Type rehab contact note   Total PT Minutes: 0   Basic Mobility Am-PAC/6Clicks Score (APPROVED PT Staff, [facility], [facility] Nursing ONLY, [place], [place], and [facility])   Patient Mobility Barrier Patient off floor for procedure/test  (off floor for CT scan)       Lorenda Peck, PT, DPT, CCS  [phone]

## 2021-04-09 NOTE — Sedation Documentation (Signed)
04/09/21  Procedure(s):  IR EMBOLIZATION/BODY  IR ABSCESS/FLUID DRAINAGE OF(PAFD)    Diagnosis:     Sedation Informed Consent, pre-sedation risk assessment and evaluation completed.  History of previous adverse experiences with sedation/analgesia/anesthesia assessed.  Monitored conscious sedation was administered under my direct supervision by an appropriately trained sedation nurse.  Appropriate Facility and Equipment compliant.      Procedure time out  Timeouts     Ladon Applebaum, RN at Fri Apr 09, 2021 1427 EST     Timeout Details     Timeout type: Preprocedure          Procedures     Panel 1: IR EMBOLIZATION/BODY with Rod Holler, MD    Panel 1: IR ABSCESS/FLUID DRAINAGE OF(PAFD) with Rod Holler, MD          Timeout Questions    Correct patient? Yes  Correct site? Yes  Correct side? Yes  Correct position? Yes  Correct procedure? Yes  Site marked? Yes  H&P note completed? Yes  Consents verified? Yes  Radiology studies available? Yes  Relevant lab results available? Yes  Allergies reviewed? Yes  Are all required blood products & devices for the procedure available? Yes  Is documentation verified? Yes           Staff Present     Physicians  Rod Holler, MD Staff  Ladon Applebaum, RN  Sandi Carne, TECHNOLOGIST          Signing History     Staff Performed Signed    Ladon Applebaum, RN Fri Apr 09, 2021 1427 EST Fri Apr 09, 2021 1427 EST                      Physician in  and out times  Physicians     Name Panel Role Time Period    Rod Holler, MD Panel 1 Primary 04/09/2021 1408      Sedation Staff     Name Type Time Period    Ladon Applebaum, RN Invasive Nurse 04/09/2021 1330          Sedation and Procedure Times:  Sedation Start Time:: 7494  Sedation End/Recovery Start Time: 1528     Proc Name Event Type Event Time    IR EMBOLIZATION/BODY  Incision Start Fri Apr 09, 2021  2:38 PM    IR EMBOLIZATION/BODY  Incision Close Fri Apr 09, 2021  3:27 PM    IR ABSCESS/FLUID DRAINAGE  OF(PAFD)  Incision Start Fri Apr 09, 2021  2:28 PM    IR ABSCESS/FLUID DRAINAGE OF(PAFD)  Incision Close Fri Apr 09, 2021  2:37 PM          Aldrete Scores    Pre Sedation  Activity: 1-->able to move 2 extremities voluntarily or on command (deconditioned and too painful due to recent abdominal surgery and pathology)  Respiration: 1-->dyspnea, limited breathing or tachypnea  Circulation: 2-->BP within 20% of pre-anesthetic level  Consciousness: 2-->fully awake  O2 Saturation: 1-->needs O2 inhalation to maintain O2 saturation greater than 90%  Dressing: 2-->dry and clean or not applicable  Pain: 1-->mild pain handled by oral medication (5/10 baseline abdomen / 8-10/10 with positioning -movement)  Ambulation: 2-->able to stand up and walk straight, on ordered bedrest, or performing at previous level of functioning  Fasting/Feeding: 2-->able to drink fluids or NPO, minimal nausea/ no vomiting  Urine Output: 2-->has voided, adequate urine output per device, or not applicable  Modified Aldrete Score: 16  Post Sedation  Assessment Scored:: Post-Procedure  Activity: 2-->able to move 4 extremities voluntarily or on command  Respiration: 2-->able to breathe and cough freely  Circulation: 2-->BP within 20% of pre-anesthetic level  Consciousness: 1-->arousable on calling  O2 Saturation: 1-->needs O2 inhalation to maintain O2 saturation greater than 90%  Dressing: 2-->dry and clean or not applicable  Pain: 2-->pain free  Ambulation: 2-->able to stand up and walk straight, on ordered bedrest, or performing at previous level of functioning  Urine Output: 2-->has voided, adequate urine output per device, or not applicable (voided large amount during procedure, could not wait for urinal)  Post Modified Aldrete Score: 18      Sedation Type: Moderate Sedation     Medications (moderate): Fentanyl, Versed  Hospital[facility]  Unit: Interventional Radiology  IV Type: Peripheral IV  Additional Intervention needed:: No     Effects  of Administration: Successful sedation w/o adverse events       Patient was continuously monitored throughout the procedure.  Provider was in attendance throughout sedation.  See Invasive Procedure Log for additional details.    Rod Holler, MD

## 2021-04-09 NOTE — Nurses Notes (Signed)
Report called to Jenny Reichmann, RN, in SICU

## 2021-04-09 NOTE — Progress Notes (Signed)
 [facility]  Surgical Oncology  Progress Note      Shante, [place], 76 y.o. male  Date of Birth:  January 06, 1946  Date of Admission:  04/03/2021  Date of service: 04/09/2021    Assessment:  This is a 76 y.o. 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 He underwent massive transfusion protocol and ex lap on 04/03/21 at an outside facility with ligation of a bleeding vessel in the liver bed. Patient was subsequently transferred to [facility]  postoperaively, intubated. Patient was subsequently extubated later that day on 3/4. Patient remains tachycardic today without further transfusion requirements.       Plan/Recommendations:  - Diet: NPO for procedure  - Aspiration precautions - HOB 84  - JP with increased output this AM             -- 370 cc total              -- more liquid compared to yesterday              -- will repeat CT A/P w/ contrast to rule out bleed              -- Type & Cross 2 units              -- Amylase level > 6,554 04/06/21 & 04/08/21  -Leukocytosis   -CT C/A/P                  -- Peripancreatic fluid collections                  -- IR consulted; drain placement today               - Blood cultures 3/9: + Gram negative Rods              - Repeat Blood cultures 3/10   - Continue Vanc, Cefepime               - ID consulted   - PE   - Continue low intensity, no bolus heparin drip (held for procedure)   - Encouraged IS and ambulation   - Aggressive plum toilet   - Wean O2     - OOB TID  - PT/OT ordered    Subjective: Pt endorsing pain overnight. Medications have helped some. Did not sleep well overnight. Currently, NPO for procedure.       Objective  Filed Vitals:    04/09/21 0235 04/09/21 0345 04/09/21 0447 04/09/21 0820   BP:  (!) 142/93  (!) 140/99   Pulse:  100  (!) 111   Resp: '20 20 20 20   '$ Temp:  36.8 C (98.2 F)   37.1 C (98.8 F)   SpO2:  91%  93%     Physical Exam:   GEN:  AOx4, resting in bed, no acute distress  PULM: Normal respiratory effort.   CV:  Tachycardic, regular rhythm   ABD:   Abdomen soft, distended, TTP RUQ. JP in place in RUQ with increase liquidy dark fluid. Incision C/D/I.   MS: Atraumatic.  Distal pulses intact.  Normal strength and range of motion of all extremities.    NEURO:   Alert and oriented to person, place and time.  Cranial nerves grossly intact.    Vascular:  All  pulses palpable and equal bilaterally  Integumentary:  Pink, warm, and dry  PSYCHOSOCIAL: Pleasant.  Normal affect.     Labs  Results for orders placed or performed during the hospital encounter of 04/03/21 (from the past 24 hour(s))   ADULT ROUTINE BLOOD CULTURE, SET OF 2 ADULT BOTTLES (BACTERIA AND YEAST)    Specimen: Blood   Result Value Ref Range    BLOOD CULTURE, ROUTINE Abnormal Stain (AA)     BLOOD CULTURE, ROUTINE Gram Negative Rods (A)     GRAM STAIN Gram Negative Rod (A)        Susceptibility    Gram Negative Rods - PHENO MIC SUSCEPTIBILITY     Indeterminate*         * Identification and antimicrobial susceptibility test results by [facility] cannot be determined for this patient sample.   MRSA SCREEN, PCR, RAPID    Specimen: Nasal Swab   Result Value Ref Range    MRSA COLONIZATION SCREEN Positive (A) Negative    Narrative    This PCR assay is performed using the FDA-cleared Xpert MRSA NxG test on the GeneExpert Dx System [facility]).   PTT (PARTIAL THROMBOPLASTIN TIME)   Result Value Ref Range    APTT 55.5 (H) 24.2 - 37.5 seconds    Narrative    Therapeutic range for unfractionated heparin is 60-100 seconds.   PTT (PARTIAL THROMBOPLASTIN TIME)   Result Value Ref Range    APTT 98.1 (H) 24.2 - 37.5 seconds    Narrative    Therapeutic range for unfractionated heparin is 60-100 seconds.   PTT (PARTIAL THROMBOPLASTIN TIME)   Result Value Ref Range    APTT 58.2 (H) 24.2 - 37.5 seconds    Narrative    Therapeutic range for  unfractionated heparin is 60-100 seconds.   PTT (PARTIAL THROMBOPLASTIN TIME)   Result Value Ref Range    APTT 52.7 (H) 24.2 - 37.5 seconds    Narrative    Therapeutic range for unfractionated heparin is 60-100 seconds.   BASIC METABOLIC PANEL   Result Value Ref Range    SODIUM 134 (L) 136 - 145 mmol/L    POTASSIUM 4.8 3.5 - 5.1 mmol/L    CHLORIDE 99 96 - 111 mmol/L    CO2 TOTAL 25 23 - 31 mmol/L    ANION GAP 10 4 - 13 mmol/L    CALCIUM 8.9 8.8 - 10.2 mg/dL    GLUCOSE 98 65 - 125 mg/dL    BUN 14 8 - 25 mg/dL    CREATININE 0.74 (L) 0.75 - 1.35 mg/dL    BUN/CREA RATIO 19 6 - 22    ESTIMATED GFR >90 >=60 mL/min/BSA   CBC/DIFF    Narrative    The following orders were created for panel order CBC/DIFF.  Procedure                               Abnormality         Status                     ---------                               -----------         ------  CBC WITH NTIR[[id number]]                Abnormal            Final result               MANUAL DIFF AND MORPHOLO.Marland KitchenMarland Kitchen[[id number]]  Abnormal            Final result                 Please view results for these tests on the individual orders.   MAGNESIUM   Result Value Ref Range    MAGNESIUM 2.0 1.8 - 2.6 mg/dL   PHOSPHORUS   Result Value Ref Range    PHOSPHORUS 2.3 2.3 - 4.0 mg/dL   CBC WITH DIFF   Result Value Ref Range    WBC 42.5 (H) 3.7 - 11.0 x10^3/uL    RBC 4.07 (L) 4.50 - 6.10 x10^6/uL    HGB 12.0 (L) 13.4 - 17.5 g/dL    HCT 37.3 (L) 38.9 - 52.0 %    MCV 91.6 78.0 - 100.0 fL    MCH 29.5 26.0 - 32.0 pg    MCHC 32.2 31.0 - 35.5 g/dL    RDW-CV 16.1 (H) 11.5 - 15.5 %    PLATELETS 434 (H) 150 - 400 x10^3/uL    MPV 11.6 8.7 - 12.5 fL   MANUAL DIFF AND MORPHOLOGY-SYSMEX   Result Value Ref Range    NEUTROPHIL % 93 %    LYMPHOCYTE %  1 %    MONOCYTE % 6 %    EOSINOPHIL % 0 %    BASOPHIL % 0 %    NEUTROPHIL BANDS % 0 %    METAMYELOCYTE %  0 %    NEUTROPHIL # 39.53 (H) 1.50 - 7.70 x10^3/uL    LYMPHOCYTE # 0.43 (L) 1.00 - 4.80 x10^3/uL    MONOCYTE # 2.55 (H)  0.20 - 1.10 x10^3/uL    EOSINOPHIL # <0.10 <=0.50 x10^3/uL    BASOPHIL # <0.10 <=0.20 x10^3/uL    NRBC FROM MANUAL DIFF 7 per 100 WBC    ANISOCYTOSIS 2+/Moderate (A) None    POLYCHROMASIA 2+/Moderate (A) None   POC BLOOD GLUCOSE (RESULTS)   Result Value Ref Range    GLUCOSE, POC 163 (H) 70 - 105 mg/dl   POC BLOOD GLUCOSE (RESULTS)   Result Value Ref Range    GLUCOSE, POC 125 (H) 70 - 105 mg/dl   POC BLOOD GLUCOSE (RESULTS)   Result Value Ref Range    GLUCOSE, POC 122 (H) 70 - 105 mg/dl   POC BLOOD GLUCOSE (RESULTS)   Result Value Ref Range    GLUCOSE, POC 109 (H) 70 - 105 mg/dl       I/O:  Date 04/08/21 0700 - 04/09/21 0659 04/09/21 0700 - 04/10/21 0659   Shift 0700-1459 1500-2259 2300-0659 24 Hour Total 0700-1459 1500-2259 2300-0659 24 Hour Total   INTAKE   IV Piggyback 300  100 400         Volume (vancomycin (VANCOCIN) 1 g in D5W 200 mL premix IVPB) 200   200         Volume (cefepime (MAXIPIME) 2 g in NS 100 mL IVPB minibag) 100   100         Volume (cefepime (MAXIPIME) 2 g in NS 100 mL IVPB minibag)   100 100       Shift Total(mL/kg) 300(3.87)  100(1.29) 400(5.15)       OUTPUT  Urine(mL/kg/hr) 350(0.56) 200(0.32) 450(0.72) 1000(0.54) 275   275     Urine (Voided) 350 [phone] 275   275     Urine Occurrence 1 x   1 x       Drains 15  98 113 160   160     JP Drain Output Aeronautical engineer Drain Right;Upper Abdomen) 15  98 113 160   160   Stool             Stool Occurrence  1 x  1 x 1 x   1 x   Shift Total(mL/kg) 365(4.7) 200(2.58) 548(7.06) 3893(73.42) 435(5.61)   435(5.61)   Weight (kg) 77.6 77.6 77.6 77.6 77.6 77.6 77.6 77.6       Radiology  Results for orders placed or performed during the hospital encounter of 04/03/21 (from the past 72 hour(s))   XR AP MOBILE CHEST     Status: None    Narrative    JAKIM DRAPEAU Lein  Male, 76 years old.    XR AP MOBILE CHEST performed on 04/06/2021 10:41 AM.    REASON FOR EXAM:  Cough    TECHNIQUE: 1 views/1 images submitted for interpretation.    COMPARISON:   04/03/2021.    FINDINGS: The endotracheal and enteric tubes have been removed. A surgical drain again overlies the left upper quadrant. Stable cardiac silhouette. Stable small left pleural effusion with adjacent atelectasis. No pneumothorax.      Impression    Stable cardiomegaly, small left pleural effusion and adjacent atelectasis.         XR AP MOBILE CHEST     Status: None    Narrative    TIMOTY BOURKE Reino  Male, 76 years old.    XR AP MOBILE CHEST performed on 04/07/2021 10:32 AM.    REASON FOR EXAM:  Concern for pneumonia    TECHNIQUE: 1 views/1 images submitted for interpretation.    COMPARISON:  04/06/2021    FINDINGS:  Stable appearing elevation right hemidiaphragm. Bibasilar opacity most suggestive of atelectasis unchanged from prior. No new focal lung consolidation. Small left-sided effusion. Cardiac contour is unchanged. There is no pneumothorax.      Impression    Small volume left-sided effusion. Bibasilar opacity. No detrimental interval change from prior.   CT CHEST ABDOMEN PELVIS W IV CONTRAST     Status: None    Narrative    KAZUMI LACHNEY Schier  Male, 76 years old.    CT CHEST ABDOMEN PELVIS W IV CONTRAST performed on 04/08/2021 1:22 PM.    REASON FOR EXAM:  Elevated WBC    TECHNIQUE: Axial CT images were obtained through the chest, abdomen, and pelvis following the intravenous administration of contrast. Coronal and sagittal reformatted images were created.    RADIATION DOSE: 1343.80 mGycm  CONTRAST: 83 mL of Isovue 370    COMPARISON: CT of the chest, abdomen, and pelvis performed March 31, 2021.    FINDINGS:     CHEST:    LUNGS: Bibasilar atelectasis is seen in association with bilateral pleural effusions, right greater than left. The large central airways are patent.    HEART/MEDIASTINUM: The heart is within normal limits in size. The thoracic aorta is within normal limits in caliber and contour. There is redemonstration of subocclusive filling defects involving the right main pulmonary artery as well as  several segmental right-sided branches, similar to March 31, 2021.    PLEURA: Small bilateral, partially loculated pleural effusions are present, right greater than left.  LYMPH NODES: No abnormally enlarged lymph nodes are detected in the chest.    BONES/SOFT TISSUES: Diminished bone mineral density is noted. No acute bony abnormalities are detected within the chest.      ABDOMEN/PELVIS:     LIVER: The liver is within normal limits in size. There are extensive smooth lobular contour changes of the liver due to a large amount of complex perihepatic fluid which demonstrates areas of increased density as well as apparent septations. Some of this complex fluid extends inferiorly into the right abdomen and along the gallbladder fossa. This large collection measures up to at least 17 cm in its greatest craniocaudal extent.     BILIARY SYSTEM/GALLBLADDER: The gallbladder is normal in size. No intra or extrahepatic bile duct dilation is present.    PANCREAS: The patient is status post recent distal pancreatectomy. There is a moderate amount of complex fluid seen along the suture line at the distal pancreatectomy bed, series 15 image 43, measuring 7.3 x 5.8 cm. This fluid is likely contiguous with the low-density fluid in the splenectomy bed.    KIDNEY/URETERS: The kidneys are normal in size with symmetric parenchymal enhancement. No hydronephrosis or solid renal mass is present. There are small low-density renal masses bilaterally which are too small to definitively characterize but most likely represent small cysts.    ADRENALS: Within normal limits.    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 There is some loculated fluid in the left upper quadrant with an indwelling surgical drain in this region.    BOWEL: The stomach is incompletely distended. There is extrinsic compression on the distal stomach and proximal duodenum due to the complex fluid collections in the upper abdomen described above. No abnormally dilated loops  of bowel are distinct transition points are detected to suggest bowel obstruction.    VASCULATURE/LYMPH NODES: The abdominal aorta is normal in caliber and contour. The portal and superior mesenteric veins are patent.    BLADDER: The bladder is mildly distended. No bladder wall thickening is visible.    REPRODUCTIVE ORGANS: The prostate appears normal in size.    PERITONEAL CAVITY: As noted above, there is complex multiloculated fluid with increased density seen predominantly in the right upper quadrant. Additional lower density ill-defined fluid is noted in the lesser sac and left upper quadrant. These collections are as described above.    BONES/SOFT TISSUES: Diffuse body wall edema is present. 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 There are mild multilevel degenerative changes of the lumbar spine. No acute or destructive bony abnormality is seen.      Impression    1. Extensive multiloculated, increased density perihepatic and right upper quadrant fluid collection with an imaging appearance most concerning for evolving hematoma/hemoperitoneum given the setting of recent hemorrhage. No internal air is visible within this collection to specifically suggest superimposed infection.    2. Additional complex fluid collections centered at the recent distal pancreatectomy bed and within the splenectomy bed could also represent evolving postoperative fluid, although, raises concern for a pancreatic leak in the appropriate context.    3. Redemonstration of a subocclusive, subsegmental right-sided pulmonary emboli, similar to March 31, 2021.    4. Small, partially loculated bilateral pleural effusions with bibasilar atelectasis.         Current Medications:  Current Facility-Administered Medications   Medication Dose Route Frequency    acetaminophen (TYLENOL) tablet  650 mg Oral Q4H    atorvastatin (LIPITOR) tablet  40  mg Oral Daily with Breakfast    benzonatate  (TESSALON) capsule  100 mg Oral Q8H PRN    cefepime (MAXIPIME) 2 g in NS 100 mL IVPB minibag  2 g Intravenous Q12H    D5W 250 mL flush bag   Intravenous Q15 Min PRN    diatrizoate meglumine & sodium oral solution  8 mL Oral Give in Radiology    docusate sodium (COLACE) capsule  100 mg Oral 2x/day    electrolyte-A (PLASMALYTE-A) premix infusion   Intravenous Continuous    elvitegravir-cobicistat-emtricitrabine-tenofovir (GENVOYA) 150-150-200-10 mg per tablet  1 Tablet Oral Daily    gabapentin (NEURONTIN) capsule  300 mg Oral 3x/day    heparin 25,000 units in NS 250 mL infusion  12 Units/kg/hr (Adjusted) Intravenous Continuous    HYDROmorphone (DILAUDID) 1 mg/mL injection  0.2 mg Intravenous Q3H PRN    levothyroxine (SYNTHROID) tablet  88 mcg Oral QAM    methocarbamol (ROBAXIN) tablet  1,000 mg Oral 4x/day    NS 250 mL flush bag   Intravenous Q15 Min PRN    NS flush syringe  2-6 mL Intracatheter Q8HRS    NS flush syringe  2-6 mL Intracatheter Q1 MIN PRN    NS flush syringe  10-30 mL Intracatheter Q8HRS    NS flush syringe  20-30 mL Intracatheter Q1 MIN PRN    ondansetron (ZOFRAN) 2 mg/mL injection  4 mg Intravenous Q6H PRN    oxyCODONE (ROXICODONE) immediate release tablet  5 mg Oral Q4H PRN    oxyCODONE (ROXICODONE) immediate releaste tablet  10 mg Oral Q4H PRN    pantoprazole (PROTONIX) delayed release tablet  40 mg Oral Daily before Breakfast    scopolamine 1 mg over 3 days transdermal patch  1 Patch Transdermal Q72H    sennosides-docusate sodium (SENOKOT-S) 8.6-'50mg'$  per tablet  1 Tablet Oral 2x/day    SSIP insulin R human (HUMULIN R) 100 units/mL injection  0-12 Units Subcutaneous Q6H PRN    vancomycin (VANCOCIN) 1 g in D5W 200 mL premix IVPB  15 mg/kg (Adjusted) Intravenous Q12H    Vancomycin IV - Pharmacist to Dose per Protocol   Does not apply Daily PRN         Zigmund Daniel, MD 04/09/2021, 09:31   PGY-1 [place] [facility]  Pager [phone]    I saw and examined this patient.   Please see the resident's note, which I have carefully reviewed, for full details. I agree with the findings and plan of care as documented in the note.  Any additions/exceptions are edited/noted.    Splenic artery and adrenal branch embolized based on presumed location and outside hospital scans. Transfeer to ICU for close monitoring.     Lorriane Shire, MD 04/11/2021 09:38  Assistant Professor of Surgery  Division of [facility]

## 2021-04-09 NOTE — Nurses Notes (Signed)
Received callback from surgery oncology resident.. no new orders given at this time. Will cont. To monitor.

## 2021-04-09 NOTE — Nurses Notes (Signed)
Pt transported to IR for procedure, accompanied by acuity RN

## 2021-04-10 DIAGNOSIS — Z21 Asymptomatic human immunodeficiency virus [HIV] infection status: Secondary | ICD-10-CM

## 2021-04-10 DIAGNOSIS — R7881 Bacteremia: Secondary | ICD-10-CM

## 2021-04-10 DIAGNOSIS — B965 Pseudomonas (aeruginosa) (mallei) (pseudomallei) as the cause of diseases classified elsewhere: Secondary | ICD-10-CM

## 2021-04-10 DIAGNOSIS — D62 Acute posthemorrhagic anemia: Secondary | ICD-10-CM

## 2021-04-10 LAB — BASIC METABOLIC PANEL
ANION GAP: 7 mmol/L (ref 4–13)
BUN/CREA RATIO: 20 (ref 6–22)
BUN: 15 mg/dL (ref 8–25)
CALCIUM: 8.1 mg/dL — ABNORMAL LOW (ref 8.8–10.2)
CHLORIDE: 101 mmol/L (ref 96–111)
CO2 TOTAL: 22 mmol/L — ABNORMAL LOW (ref 23–31)
CREATININE: 0.75 mg/dL (ref 0.75–1.35)
ESTIMATED GFR: >90 mL/min/BSA (ref >=60)
GLUCOSE: 126 mg/dL — ABNORMAL HIGH (ref 65–125)
POTASSIUM: 4.3 mmol/L (ref 3.5–5.1)
SODIUM: 130 mmol/L — ABNORMAL LOW (ref 136–145)

## 2021-04-10 LAB — MANUAL DIFF AND MORPHOLOGY-SYSMEX
BASOPHIL #: <0.1 10*3/uL (ref <=0.20)
BASOPHIL %: 0 %
EOSINOPHIL #: <0.1 10*3/uL (ref <=0.50)
EOSINOPHIL %: 0 %
LYMPHOCYTE #: 1.36 10*3/uL (ref 1.00–4.80)
LYMPHOCYTE %: 4 %
METAMYELOCYTE %: 1 %
MONOCYTE #: 1.36 10*3/uL — ABNORMAL HIGH (ref 0.20–1.10)
MONOCYTE %: 4 %
MYELOCYTE %: 1 %
NEUTROPHIL #: 30.94 10*3/uL — ABNORMAL HIGH (ref 1.50–7.70)
NEUTROPHIL %: 90 %
NEUTROPHIL BANDS %: 1 %
NRBC FROM MANUAL DIFF: 4 per 100 WBC
REACTIVE LYMPHOCYTE %: 0 %

## 2021-04-10 LAB — H & H
HCT: 26 % — ABNORMAL LOW (ref 38.9–52.0)
HCT: 26 % — ABNORMAL LOW (ref 38.9–52.0)
HCT: 27.5 % — ABNORMAL LOW (ref 38.9–52.0)
HGB: 8.3 g/dL — ABNORMAL LOW (ref 13.4–17.5)
HGB: 8.2 g/dL — ABNORMAL LOW (ref 13.4–17.5)
HGB: 9 g/dL — ABNORMAL LOW (ref 13.4–17.5)

## 2021-04-10 LAB — VANCOMYCIN PEAK: VANCOMYCIN PEAK: 20.9 ug/mL — ABNORMAL LOW (ref 30.0–40.0)

## 2021-04-10 LAB — CBC WITH DIFF
HCT: 25.3 % — ABNORMAL LOW (ref 38.9–52.0)
HGB: 8.2 g/dL — ABNORMAL LOW (ref 13.4–17.5)
MCH: 29.6 pg (ref 26.0–32.0)
MCHC: 32.4 g/dL (ref 31.0–35.5)
MCV: 91.3 fL (ref 78.0–100.0)
MPV: 11.1 fL (ref 8.7–12.5)
PLATELETS: 383 10*3/uL (ref 150–400)
RBC: 2.77 10*6/uL — ABNORMAL LOW (ref 4.50–6.10)
RDW-CV: 16.1 % — ABNORMAL HIGH (ref 11.5–15.5)
WBC: 34 10*3/uL — ABNORMAL HIGH (ref 3.7–11.0)

## 2021-04-10 LAB — PTT (PARTIAL THROMBOPLASTIN TIME)
APTT: 31.6 seconds (ref 24.2–37.5)
APTT: 33 seconds (ref 24.2–37.5)

## 2021-04-10 LAB — POC BLOOD GLUCOSE (RESULTS)
GLUCOSE, POC: 139 mg/dl — ABNORMAL HIGH (ref 70–105)
GLUCOSE, POC: 124 mg/dl — ABNORMAL HIGH (ref 70–105)
GLUCOSE, POC: 230 mg/dl — ABNORMAL HIGH (ref 70–105)
GLUCOSE, POC: 175 mg/dl — ABNORMAL HIGH (ref 70–105)

## 2021-04-10 LAB — MAGNESIUM: MAGNESIUM: 2 mg/dL (ref 1.8–2.6)

## 2021-04-10 LAB — AMYLASE BODY FLUID
AMYLASE BODY FLUID: >6554 U/L
AMYLASE BODY FLUID: >6554 U/L

## 2021-04-10 LAB — PHOSPHORUS: PHOSPHORUS: 2 mg/dL — ABNORMAL LOW (ref 2.3–4.0)

## 2021-04-10 LAB — VANCOMYCIN, TROUGH: VANCOMYCIN TROUGH: 12 ug/mL (ref 10.0–20.0)

## 2021-04-10 MED ORDER — SODIUM CHLORIDE 0.9 % INTRAVENOUS PIGGYBACK
2.0000 g | Freq: Three times a day (TID) | INTRAVENOUS | Status: DC
Start: 2021-04-10 — End: 2021-04-12
  Administered 2021-04-10: 0 g via INTRAVENOUS
  Administered 2021-04-10 – 2021-04-11 (×3): 2 g via INTRAVENOUS
  Administered 2021-04-11 (×3): 0 g via INTRAVENOUS
  Administered 2021-04-11 – 2021-04-12 (×2): 2 g via INTRAVENOUS
  Administered 2021-04-12: 0 g via INTRAVENOUS
  Administered 2021-04-12: 2 g via INTRAVENOUS
  Administered 2021-04-12: 0 g via INTRAVENOUS
  Filled 2021-04-10 (×6): qty 12.5

## 2021-04-10 MED ORDER — SODIUM PHOSPHATE 3 MMOL/ML INTRAVENOUS SOLUTION
30.0000 mmol | Freq: Once | INTRAVENOUS | Status: AC
Start: 2021-04-10 — End: 2021-04-10
  Administered 2021-04-10: 0 mmol via INTRAVENOUS
  Administered 2021-04-10: 30 mmol via INTRAVENOUS
  Filled 2021-04-10: qty 10

## 2021-04-10 MED ORDER — HEPARIN 25,000 UNIT/250 ML (100 UNIT/ML) IN NS IV SOLN
12.0000 [IU]/kg/h | INTRAVENOUS | Status: DC
Start: 2021-04-10 — End: 2021-04-13
  Administered 2021-04-10 (×2): 12 [IU]/kg/h via INTRAVENOUS
  Administered 2021-04-10: 16 [IU]/kg/h via INTRAVENOUS
  Administered 2021-04-11 (×4): 18 [IU]/kg/h via INTRAVENOUS
  Administered 2021-04-11: 17 [IU]/kg/h via INTRAVENOUS
  Administered 2021-04-11: 18 [IU]/kg/h via INTRAVENOUS
  Administered 2021-04-12 (×3): 17 [IU]/kg/h via INTRAVENOUS
  Administered 2021-04-12: 18 [IU]/kg/h via INTRAVENOUS
  Administered 2021-04-12: 19 [IU]/kg/h via INTRAVENOUS
  Administered 2021-04-12: 17 [IU]/kg/h via INTRAVENOUS
  Administered 2021-04-12: 18 [IU]/kg/h via INTRAVENOUS
  Administered 2021-04-12 – 2021-04-13 (×2): 17 [IU]/kg/h via INTRAVENOUS
  Administered 2021-04-13: 0 [IU]/kg/h via INTRAVENOUS
  Administered 2021-04-13 (×2): 17 [IU]/kg/h via INTRAVENOUS
  Filled 2021-04-10 (×5): qty 250

## 2021-04-10 MED ORDER — ELECTROLYTE-A INTRAVENOUS SOLUTION
INTRAVENOUS | Status: DC
Start: 2021-04-10 — End: 2021-04-12
  Filled 2021-04-10 (×4): qty 100

## 2021-04-10 MED ORDER — METHOCARBAMOL 500 MG TABLET
500.0000 mg | ORAL_TABLET | Freq: Four times a day (QID) | ORAL | Status: DC
Start: 2021-04-10 — End: 2021-05-16
  Administered 2021-04-10 – 2021-04-12 (×8): 500 mg via ORAL
  Administered 2021-04-12 (×2): 0 mg via ORAL
  Administered 2021-04-12 – 2021-04-16 (×14): 500 mg via ORAL
  Administered 2021-04-16: 0 mg via ORAL
  Administered 2021-04-16: 500 mg via ORAL
  Administered 2021-04-16: 0 mg via ORAL
  Administered 2021-04-17 – 2021-04-21 (×18): 500 mg via ORAL
  Administered 2021-04-21: 0 mg via ORAL
  Administered 2021-04-21: 500 mg via ORAL
  Administered 2021-04-22: 0 mg via ORAL
  Administered 2021-04-22 – 2021-04-24 (×8): 500 mg via ORAL
  Administered 2021-04-24: 0 mg via ORAL
  Administered 2021-04-24 – 2021-04-25 (×3): 500 mg via ORAL
  Administered 2021-04-25: 0 mg via ORAL
  Administered 2021-04-25 – 2021-05-05 (×39): 500 mg via ORAL
  Administered 2021-05-05 (×2): 0 mg via ORAL
  Administered 2021-05-05 – 2021-05-06 (×3): 500 mg via ORAL
  Administered 2021-05-06 (×2): 0 mg via ORAL
  Administered 2021-05-07 – 2021-05-11 (×18): 500 mg via ORAL
  Administered 2021-05-11: 0 mg via ORAL
  Administered 2021-05-11 – 2021-05-12 (×4): 500 mg via ORAL
  Administered 2021-05-12 – 2021-05-13 (×2): 0 mg via ORAL
  Administered 2021-05-13: 500 mg via ORAL
  Administered 2021-05-13 (×2): 0 mg via ORAL
  Administered 2021-05-14: 500 mg via ORAL
  Administered 2021-05-14: 0 mg via ORAL
  Administered 2021-05-14: 500 mg via ORAL
  Administered 2021-05-14: 0 mg via ORAL
  Administered 2021-05-15 (×3): 500 mg via ORAL
  Administered 2021-05-15: 0 mg via ORAL
  Filled 2021-04-10 (×136): qty 1

## 2021-04-10 MED ORDER — SODIUM CHLORIDE 0.9 % INTRAVENOUS SOLUTION
30.0000 mmol | Freq: Once | INTRAVENOUS | Status: DC
Start: 2021-04-10 — End: 2021-04-10

## 2021-04-10 NOTE — Nurses Notes (Signed)
While transitioning from the bed to the chair at 0615, Pt began having brown, foul smelling drainage from the inferior portion of his mid-abdominal incision. SICU MD Martyn Ehrich notified of this new finding. No new orders at this time. Primary RN will continue to monitor drainage.

## 2021-04-10 NOTE — Progress Notes (Signed)
[facility]  Surgical Oncology  Progress Note      [name], [name], 76 y.o. male  Date of Birth:  Jun 11, 1945  Date of Admission:  04/03/2021  Date of service: 04/10/2021    Assessment:  This is a 76 y.o. 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 He underwent massive transfusion protocol and ex lap on 04/03/21 at an outside facility with ligation of a bleeding vessel in the liver bed. Patient was subsequently transferred to [facility] postoperaively, intubated. Patient was subsequently extubated later that day on 3/4. Patient remains tachycardic today without further transfusion requirements.       Plan/Recommendations:  - Patient transferred to SICU post-procedure due to concern for bleed  - Diet: NPO   - Aspiration precautions - HOB 30  - Patient underwent drain placement & emobolization with IR yesterday          - JP with increased output yesterday               -- has decreased since embolization yesterday: 90 today               -- Amylase level > 6,554 04/06/21 & 04/08/21, repeat amylase tomorrow   -Leukocytosis   -CT C/A/P                  -- Peripancreatic fluid collections                  -- IR consulted; drain placement yesterday              - Blood cultures 3/9: + Gram negative Rods- pseudomonas              - Repeat Blood cultures 3/10   - Continue Vanc, Cefepime, flagyl               - ID consulted   - PE   - Continue low intensity, no bolus heparin drip (held)              - Repeat H&H this afternoon, if stable, can restart heparin    - Encouraged IS and ambulation   - Aggressive plum toilet   - Wean O2   - C/f for midline fistula        - increased brown drainage from midline incision        - staples removed bedside       - ostomy bag added to monitor output     - OOB TID  - PT/OT ordered    Subjective: Pt  sitting up in chair this morning. Started having brown drainage from midline incision this AM. Denies any nausea/vomiting.     Objective  Filed Vitals:    04/10/21 0545 04/10/21 0600 04/10/21 0700 04/10/21 0800   BP: 126/79 (!) 148/89 120/78    Pulse: 96  (!) 102    Resp: 15  13    Temp:    36.7 C (98.1 F)   SpO2: 91% 92% 96%      Physical Exam:   GEN:  AOx4, resting in bed, no acute distress  PULM: Normal respiratory effort.   CV:  Tachycardic, regular rhythm   ABD:   Abdomen soft, distended, TTP RUQ. JP in place in RUQ with bloody output,  Lower midline incision with  brown drainage present. Pigtail catheter present with bloody output.   MS: Atraumatic.  Distal pulses intact.  Normal strength and range of motion of all extremities.    NEURO:   Alert and oriented to person, place and time.  Cranial nerves grossly intact.    Vascular:  All pulses palpable and equal bilaterally  Integumentary:  Pink, warm, and dry  PSYCHOSOCIAL: Pleasant.  Normal affect.     Labs  Results for orders placed or performed during the hospital encounter of 04/03/21 (from the past 24 hour(s))   WOUND, SUPERFICIAL/NON-STERILE SITE, AEROBIC CULTURE AND GRAM STAIN    Specimen: Wound; Other   Result Value Ref Range    GRAM STAIN 1+ Rare PMNs     GRAM STAIN 3+ Several Gram Positive Cocci    H & H   Result Value Ref Range    HGB 9.3 (L) 13.4 - 17.5 g/dL    HCT 28.5 (L) 38.9 - 52.0 %   TEG, RAPID GLOBAL WITH LYSIS (TRAUMA)   Result Value Ref Range    R (CK) 6.4 4.6 - 9.1 min    LYS30 (%) 0.0 0.0 - 2.6 %    MA (CRT RAPID) >75.0 (H) 52.0 - 70.0 mm    MA FIBRINOGEN (CFF) >52.0 (H) 15.0 - 32.0 mm   CBC/DIFF *Canceled*    Narrative    The following orders were created for panel order CBC/DIFF.  Procedure                               Abnormality         Status                     ---------                               -----------         ------                       Please view results for these tests on the individual orders.   H & H   Result Value  Ref Range    HGB 8.9 (L) 13.4 - 17.5 g/dL    HCT 27.8 (L) 38.9 - 52.0 %   LACTIC ACID LEVEL W/ REFLEX FOR LEVEL >2.0   Result Value Ref Range    LACTIC ACID 0.8 0.5 - 2.2 mmol/L   TEG, RAPID GLOBAL WITH LYSIS (TRAUMA)   Result Value Ref Range    R (CK) 5.7 4.6 - 9.1 min    LYS30 (%) 0.1 0.0 - 2.6 %    MA (CRT RAPID) >75.0 (H) 52.0 - 70.0 mm    MA FIBRINOGEN (CFF) >52.0 (H) 15.0 - 32.0 mm   CBC/DIFF    Narrative    The following orders were created for panel order CBC/DIFF.  Procedure                               Abnormality         Status                     ---------                               -----------         ------  CBC WITH DIFF[[id number]]                Abnormal            Final result               MANUAL DIFF AND MORPHOLO.Marland KitchenMarland Kitchen[[id number]]  Abnormal            Final result                 Please view results for these tests on the individual orders.   BASIC METABOLIC PANEL   Result Value Ref Range    SODIUM 133 (L) 136 - 145 mmol/L    POTASSIUM 4.6 3.5 - 5.1 mmol/L    CHLORIDE 100 96 - 111 mmol/L    CO2 TOTAL 25 23 - 31 mmol/L    ANION GAP 8 4 - 13 mmol/L    CALCIUM 8.1 (L) 8.8 - 10.2 mg/dL    GLUCOSE 109 65 - 125 mg/dL    BUN 14 8 - 25 mg/dL    CREATININE 0.71 (L) 0.75 - 1.35 mg/dL    BUN/CREA RATIO 20 6 - 22    ESTIMATED GFR >90 >=60 mL/min/BSA   PHOSPHORUS   Result Value Ref Range    PHOSPHORUS 2.2 (L) 2.3 - 4.0 mg/dL   MAGNESIUM   Result Value Ref Range    MAGNESIUM 2.0 1.8 - 2.6 mg/dL   TROPONIN-I   Result Value Ref Range    TROPONIN I <7 0 - 30 ng/L   CBC WITH DIFF   Result Value Ref Range    WBC 39.8 (H) 3.7 - 11.0 x10^3/uL    RBC 2.94 (L) 4.50 - 6.10 x10^6/uL    HGB 8.9 (L) 13.4 - 17.5 g/dL    HCT 26.8 (L) 38.9 - 52.0 %    MCV 91.2 78.0 - 100.0 fL    MCH 30.3 26.0 - 32.0 pg    MCHC 33.2 31.0 - 35.5 g/dL    RDW-CV 16.1 (H) 11.5 - 15.5 %    PLATELETS 397 150 - 400 x10^3/uL    MPV 11.2 8.7 - 12.5 fL    Narrative    White Blood CountResults reviewed .   MANUAL DIFF AND  MORPHOLOGY-SYSMEX   Result Value Ref Range    NEUTROPHIL % 94 %    LYMPHOCYTE %  2 %    MONOCYTE % 3 %    EOSINOPHIL % 0 %    BASOPHIL % 0 %    METAMYELOCYTE %  0 %    MYELOCYTE % 1 %    NEUTROPHIL # 37.41 (H) 1.50 - 7.70 x10^3/uL    LYMPHOCYTE # 0.80 (L) 1.00 - 4.80 x10^3/uL    MONOCYTE # 1.19 (H) 0.20 - 1.10 x10^3/uL    EOSINOPHIL # <0.10 <=0.50 x10^3/uL    BASOPHIL # <0.10 <=0.20 x10^3/uL    NRBC FROM MANUAL DIFF 6 per 100 WBC    PLATELET CLUMPS Present (A) None    RBC MORPHOLOGY Normal RBC and PLT Morphology    VANCOMYCIN PEAK   Result Value Ref Range    VANCOMYCIN PEAK 20.9 (L) 30.0 - 40.0 ug/mL    DOSE DATE 04/09/2021     DOSE TIME 10:00 PM    CBC/DIFF    Narrative    The following orders were created for panel order CBC/DIFF.  Procedure  Abnormality         Status                     ---------                               -----------         ------                     CBC WITH DIFF[[id number]]                Abnormal            Final result               MANUAL DIFF AND MORPHOLO.Marland KitchenMarland Kitchen[[id number]]  Abnormal            Final result                 Please view results for these tests on the individual orders.   BASIC METABOLIC PANEL   Result Value Ref Range    SODIUM 130 (L) 136 - 145 mmol/L    POTASSIUM 4.3 3.5 - 5.1 mmol/L    CHLORIDE 101 96 - 111 mmol/L    CO2 TOTAL 22 (L) 23 - 31 mmol/L    ANION GAP 7 4 - 13 mmol/L    CALCIUM 8.1 (L) 8.8 - 10.2 mg/dL    GLUCOSE 126 (H) 65 - 125 mg/dL    BUN 15 8 - 25 mg/dL    CREATININE 0.75 0.75 - 1.35 mg/dL    BUN/CREA RATIO 20 6 - 22    ESTIMATED GFR >90 >=60 mL/min/BSA   MAGNESIUM   Result Value Ref Range    MAGNESIUM 2.0 1.8 - 2.6 mg/dL   PHOSPHORUS   Result Value Ref Range    PHOSPHORUS 2.0 (L) 2.3 - 4.0 mg/dL   CBC WITH DIFF   Result Value Ref Range    WBC 34.0 (H) 3.7 - 11.0 x10^3/uL    RBC 2.77 (L) 4.50 - 6.10 x10^6/uL    HGB 8.2 (L) 13.4 - 17.5 g/dL    HCT 25.3 (L) 38.9 - 52.0 %    MCV 91.3 78.0 - 100.0 fL    MCH 29.6 26.0 - 32.0 pg    MCHC  32.4 31.0 - 35.5 g/dL    RDW-CV 16.1 (H) 11.5 - 15.5 %    PLATELETS 383 150 - 400 x10^3/uL    MPV 11.1 8.7 - 12.5 fL   MANUAL DIFF AND MORPHOLOGY-SYSMEX   Result Value Ref Range    NEUTROPHIL % 90 %    LYMPHOCYTE %  4 %    MONOCYTE % 4 %    EOSINOPHIL % 0 %    BASOPHIL % 0 %    NEUTROPHIL BANDS % 1 %    METAMYELOCYTE %  1 %    MYELOCYTE % 1 %    REACTIVE LYMPHOCYTE % 0 %    NEUTROPHIL # 30.94 (H) 1.50 - 7.70 x10^3/uL    LYMPHOCYTE # 1.36 1.00 - 4.80 x10^3/uL    MONOCYTE # 1.36 (H) 0.20 - 1.10 x10^3/uL    EOSINOPHIL # <0.10 <=0.50 x10^3/uL    BASOPHIL # <0.10 <=0.20 x10^3/uL    NRBC FROM MANUAL DIFF 4 per 100 WBC    ECHINOCYTE (BURR CELL) 2+/Moderate (A) None   H & H   Result Value Ref Range  HGB 8.3 (L) 13.4 - 17.5 g/dL    HCT 26.0 (L) 38.9 - 52.0 %   TYPE AND CROSS RED CELLS - UNITS , 2 Units   Result Value Ref Range    UNITS ORDERED 6     SPECIMEN EXPIRATION DATE 04/12/2021,2359     ABO/RH(D) A NEGATIVE     ANTIBODY SCREEN NEGATIVE    POC BLOOD GLUCOSE (RESULTS)   Result Value Ref Range    GLUCOSE, POC 154 (H) 70 - 105 mg/dl   POC BLOOD GLUCOSE (RESULTS)   Result Value Ref Range    GLUCOSE, POC 117 (H) 70 - 105 mg/dl   POC BLOOD GLUCOSE (RESULTS)   Result Value Ref Range    GLUCOSE, POC 139 (H) 70 - 105 mg/dl   POC BLOOD GLUCOSE (RESULTS)   Result Value Ref Range    GLUCOSE, POC 124 (H) 70 - 105 mg/dl       I/O:  Date 04/09/21 0700 - 04/10/21 0659 04/10/21 0700 - 04/11/21 0659   Shift 0700-1459 1500-2259 2300-0659 24 Hour Total 0700-1459 1500-2259 2300-0659 24 Hour Total   INTAKE   P.O. 0   0         Oral 0   0       I.V.(mL/kg/hr) 1000(1.61) 760(1.16) 600(0.86) 2360(1.13) 75   75     IVPB Volume   300  300         Med (IV) Flush Volume  10  10         Volume Infused (electrolyte-A (PLASMALYTE-A) premix infusion)  [phone] 75   75     Volume Infused (albumin human (ALBUMINAR) 5% premix infusion) 500   500         Volume Infused (electrolyte-A (PLASMALYTE-A) bolus infusion 500 mL) 500   500       Other  0 0 0          Drain Input (Drain (Miscellaneous) #34F pigtail catheter for RLQ fluid collection Lower;Right;Anterior Abdomen)  0 0 0       IV Piggyback 200   200         Volume (vancomycin (VANCOCIN) 1 g in D5W 200 mL premix IVPB) 200   200       Shift Total(mL/kg) 2774(12.87) 760(9.26) 600(6.9) 8676(72.09) 75(0.86)   75(0.86)   OUTPUT   Urine(mL/kg/hr) 550(0.89) 500(0.76) 1000(1.44) 2050(0.98)         Urine (Voided) [phone] 2050       Drains 330 [phone]         JP Drain Output [facility] Drain Right;Upper Abdomen) 330 280 90 700         Drain Output (Drain (Miscellaneous) #34F pigtail catheter for RLQ fluid collection Lower;Right;Anterior Abdomen)  475 350 825       Stool             Stool Occurrence 1 x   1 x       Shift Total(mL/kg) 470(96.28) 3662(94.76) 5465(03.54) 6568(12.75)       Weight (kg) 77.6 82.1 86.9 86.9 86.9 86.9 86.9 86.9       Radiology  Results for orders placed or performed during the hospital encounter of 04/03/21 (from the past 72 hour(s))   XR AP MOBILE CHEST     Status: None    Narrative    [name] Scharf  Male, 76 years old.    XR AP MOBILE CHEST performed on 04/07/2021 10:32 AM.  REASON FOR EXAM:  Concern for pneumonia    TECHNIQUE: 1 views/1 images submitted for interpretation.    COMPARISON:  04/06/2021    FINDINGS:  Stable appearing elevation right hemidiaphragm. Bibasilar opacity most suggestive of atelectasis unchanged from prior. No new focal lung consolidation. Small left-sided effusion. Cardiac contour is unchanged. There is no pneumothorax.      Impression    Small volume left-sided effusion. Bibasilar opacity. No detrimental interval change from prior.   CT CHEST ABDOMEN PELVIS W IV CONTRAST     Status: None    Narrative    IBRAHIM MCPHEETERS Arrey  Male, 76 years old.    CT CHEST ABDOMEN PELVIS W IV CONTRAST performed on 04/08/2021 1:22 PM.    REASON FOR EXAM:  Elevated WBC    TECHNIQUE: Axial CT images were obtained through the chest, abdomen, and pelvis following the  intravenous administration of contrast. Coronal and sagittal reformatted images were created.    RADIATION DOSE: 1343.80 mGycm  CONTRAST: 83 mL of Isovue 370    COMPARISON: CT of the chest, abdomen, and pelvis performed March 31, 2021.    FINDINGS:     CHEST:    LUNGS: Bibasilar atelectasis is seen in association with bilateral pleural effusions, right greater than left. The large central airways are patent.    HEART/MEDIASTINUM: The heart is within normal limits in size. The thoracic aorta is within normal limits in caliber and contour. There is redemonstration of subocclusive filling defects involving the right main pulmonary artery as well as several segmental right-sided branches, similar to March 31, 2021.    PLEURA: Small bilateral, partially loculated pleural effusions are present, right greater than left.    LYMPH NODES: No abnormally enlarged lymph nodes are detected in the chest.    BONES/SOFT TISSUES: Diminished bone mineral density is noted. No acute bony abnormalities are detected within the chest.      ABDOMEN/PELVIS:     LIVER: The liver is within normal limits in size. There are extensive smooth lobular contour changes of the liver due to a large amount of complex perihepatic fluid which demonstrates areas of increased density as well as apparent septations. Some of this complex fluid extends inferiorly into the right abdomen and along the gallbladder fossa. This large collection measures up to at least 17 cm in its greatest craniocaudal extent.     BILIARY SYSTEM/GALLBLADDER: The gallbladder is normal in size. No intra or extrahepatic bile duct dilation is present.    PANCREAS: The patient is status post recent distal pancreatectomy. There is a moderate amount of complex fluid seen along the suture line at the distal pancreatectomy bed, series 15 image 43, measuring 7.3 x 5.8 cm. This fluid is likely contiguous with the low-density fluid in the splenectomy bed.    KIDNEY/URETERS: The kidneys are  normal in size with symmetric parenchymal enhancement. No hydronephrosis or solid renal mass is present. There are small low-density renal masses bilaterally which are too small to definitively characterize but most likely represent small cysts.    ADRENALS: Within normal limits.    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 There is some loculated fluid in the left upper quadrant with an indwelling surgical drain in this region.    BOWEL: The stomach is incompletely distended. There is extrinsic compression on the distal stomach and proximal duodenum due to the complex fluid collections in the upper abdomen described above. No abnormally dilated loops of bowel are distinct transition points are detected to suggest bowel  obstruction.    VASCULATURE/LYMPH NODES: The abdominal aorta is normal in caliber and contour. The portal and superior mesenteric veins are patent.    BLADDER: The bladder is mildly distended. No bladder wall thickening is visible.    REPRODUCTIVE ORGANS: The prostate appears normal in size.    PERITONEAL CAVITY: As noted above, there is complex multiloculated fluid with increased density seen predominantly in the right upper quadrant. Additional lower density ill-defined fluid is noted in the lesser sac and left upper quadrant. These collections are as described above.    BONES/SOFT TISSUES: Diffuse body wall edema is present. 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 There are mild multilevel degenerative changes of the lumbar spine. No acute or destructive bony abnormality is seen.      Impression    1. Extensive multiloculated, increased density perihepatic and right upper quadrant fluid collection with an imaging appearance most concerning for evolving hematoma/hemoperitoneum given the setting of recent hemorrhage. No internal air is visible within this collection to specifically suggest superimposed infection.    2. Additional complex fluid  collections centered at the recent distal pancreatectomy bed and within the splenectomy bed could also represent evolving postoperative fluid, although, raises concern for a pancreatic leak in the appropriate context.    3. Redemonstration of a subocclusive, subsegmental right-sided pulmonary emboli, similar to March 31, 2021.    4. Small, partially loculated bilateral pleural effusions with bibasilar atelectasis.     CT ANGIO ABDOMEN PELVIS W/WO IV CONTRAST     Status: None (Preliminary result)    Narrative    CT ANGIO ABDOMEN PELVIS W/WO IV CONTRAST performed on 04/09/2021 11:49 AM    INDICATION: 76 years old Male  Abdominal bleeding    TECHNIQUE: Axial images from lung bases through upper thighs followed by intravenous administration of nonionic contrast, with additional multiplanar images in early arterial and venous phases. Arterial coronal and sagittal reformats utilize MIP reconstruction techniques    CONTRAST: 100 cc of Isovue 370    RADIATION DOSE: 2987.10 mGycm    COMPARISON: CT chest abdomen pelvis 04/08/2021.    FINDINGS:  VASCULAR: The abdominal aorta is of normal caliber.  The celiac, superior mesenteric, and inferior mesenteric arteries are patent.  The bilateral renal arteries are patent.  The iliofemoral vessels are normal caliber.  Mild atherosclerotic disease is present. Note is made of a pulmonary embolism involving the right upper and lower lobes. This was present on CT chest abdomen pelvis 03/31/2020. The portal vein and IVC are patent.    NONVASCULAR:  Lung Bases: Greater than right pleural effusions with associated atelectasis are identified.    Liver: Diffuse fatty change of the liver is present.   There is redemonstration of a loculated, complex, perihepatic collection resulting in mass effect on the hepatic parenchyma. Overall this is similar in appearance relative to CT 04/08/2021.    Gallbladder/Biliary System: No hyperattenuating foci are present within the gallbladder lumen.  No bile duct  dilation is seen.    Spleen: The spleen is surgically absent. Nonloculated fluid as well as surgical train are present within the left upper quadrant.    Pancreas: There are postoperative changes from distal pancreatectomy with postoperative fluid similar in appearance relative to comparison study.    Adrenals: The adrenal glands are grossly unremarkable.    Kidney/Ureters/Bladder: The kidneys are symmetric in size. Bilateral simple to minimally complex cysts are again noted. There is no evidence of hydronephrosis. Mild bilateral  perinephric stranding is unchanged. The ureters are of normal caliber.  The bladder is partially distended limiting evaluation..    Reproductive Organs: The prostate is normal in size.    Bowel: There is narrowing of the pylorus and first portion of the duodenum secondary to extrinsic compression by multiple abdominal fluid collections. This is unchanged. Fluid collection along the greater curvature is redemonstrated and unchanged. Enteric contrast is noted throughout the large bowel and present to the level the rectum. No abnormally dilated loops of bowel are identified to suggest obstruction.    Lymph Nodes: No suspicious lymph nodes are seen throughout the abdomen and pelvis.    Peritoneal Cavity: Multiple complex fluid collections within the right upper quadrant as well as fluid throughout the mid abdomen and left upper quadrant are present and described above. A small amount of residual pneumoperitoneum is noted and decreased relative to comparison study. Postoperative changes along the abdominal midline wall are present.    Soft Tissues: Anasarca.    Bones: Vertebral body height and alignment is maintained. Bilateral femoral head avascular necrosis is noted.        Impression    1.No acute bleed.  2.Stable multiloculated right perihepatic collection and fluid collections within the mid abdomen and left upper quadrant.  3.Partially imaged subocclusive, segmental, right pulmonary emboli  previously demonstrated on CT 03/31/2021.  4.Right greater than left pleural effusion and atelectasis.         Current Medications:  Current Facility-Administered Medications   Medication Dose Route Frequency    acetaminophen (TYLENOL) tablet  650 mg Oral Q4H    atorvastatin (LIPITOR) tablet  40 mg Oral Daily with Breakfast    benzonatate (TESSALON) capsule  100 mg Oral Q8H PRN    cefepime (MAXIPIME) 2 g in NS 100 mL IVPB minibag  2 g Intravenous Q12H    D5W 250 mL flush bag   Intravenous Q15 Min PRN    dextrose 5 % in electrolyte-A (PLASMALYTE-A) 1,000 mL infusion   Intravenous Continuous    diatrizoate meglumine & sodium oral solution  8 mL Oral Give in Radiology    docusate sodium (COLACE) capsule  100 mg Oral 2x/day    elvitegravir-cobicistat-emtricitrabine-tenofovir (GENVOYA) 150-150-200-10 mg per tablet  1 Tablet Oral Daily    gabapentin (NEURONTIN) capsule  300 mg Oral 3x/day    [Held by provider] heparin 25,000 units in NS 250 mL infusion  12 Units/kg/hr (Adjusted) Intravenous Continuous    HYDROmorphone (DILAUDID) 1 mg/mL injection  0.2 mg Intravenous Q3H PRN    iopamidol (ISOVUE-370) 76% infusion  100 mL Intravenous Give in Radiology    levothyroxine (SYNTHROID) tablet  88 mcg Oral QAM    methocarbamol (ROBAXIN) tablet  1,000 mg Oral 4x/day    metroNIDAZOLE (FLAGYL) tablet  500 mg Oral 2x/day    NS 250 mL flush bag   Intravenous Q15 Min PRN    NS flush syringe  2-6 mL Intracatheter Q8HRS    NS flush syringe  2-6 mL Intracatheter Q1 MIN PRN    NS flush syringe  10-30 mL Intracatheter Q8HRS    NS flush syringe  20-30 mL Intracatheter Q1 MIN PRN    ondansetron (ZOFRAN) 2 mg/mL injection  4 mg Intravenous Q6H PRN    oxyCODONE (ROXICODONE) immediate release tablet  5 mg Oral Q4H PRN    oxyCODONE (ROXICODONE) immediate releaste tablet  10 mg Oral Q4H PRN    pantoprazole (PROTONIX) delayed release tablet  40 mg Oral Daily before Breakfast  scopolamine 1 mg over 3 days transdermal patch  1 Patch Transdermal Q72H     sennosides-docusate sodium (SENOKOT-S) 8.6-'50mg'$  per tablet  1 Tablet Oral 2x/day    sodium phosphate 30 mmol in D5W 100 mL IVPB  30 mmol Intravenous Once    SSIP insulin R human (HUMULIN R) 100 units/mL injection  0-12 Units Subcutaneous Q6H PRN    vancomycin (VANCOCIN) 1 g in D5W 200 mL premix IVPB  15 mg/kg (Adjusted) Intravenous Q12H    Vancomycin IV - Pharmacist to Dose per Protocol   Does not apply Daily PRN         Zigmund Daniel, MD 04/10/2021, 09:49   PGY-1 [place] [facility]  Pager [phone]    I saw and examined this patient.  Please see the resident's note, which I have carefully reviewed, for full details. I agree with the findings and plan of care as documented in the note.  Any additions/exceptions are edited/noted.    No peritonitis. WBC trending down. Lower midine opened some with ostomy bag placed. 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 Will restart hep if Hgb normal and monitor.     Lorriane Shire, MD 04/11/2021 10:12  Assistant Professor of Surgery  Division of [facility]

## 2021-04-10 NOTE — Nurses Notes (Signed)
Pt leaking stool out of his MAI. Surg Onc at bedside to assess. SICU team also notified. Verbal orders to monitor output at this time. Will continue to monitor at this time.

## 2021-04-10 NOTE — Consults (Signed)
PATIENT NAME:  [name] NUMBER:  [id number]  DATE OF SERVICE: 04/10/2021  DATE OF BIRTH:  Feb 04, 1945      INFECTIOUS DISEASE CONSULT FOLLOW-UP NOTE    HOSPITAL DAY:  LOS: 7 days     REASON FOR CONSULTATION:  Gram-negative rod bacteremia and possible intra-abdominal infected hematoma in a patient with well controlled HIV infection    SUBJECTIVE: This morning, [name] was sitting up in the chair at the time of evaluation.  He reports that he is experiencing significant abdominal pain.  He reports that he developed drainage from the inferior part of his abdominal incision and the primary service placed a colostomy bag over the area.  He has remained afebrile.  He reports that he is tolerating antimicrobial therapy well.  He denies experiencing any adverse reactions including rash, itching, or diarrhea.  There were no acute events overnight.    OBJECTIVE:  Vitals:  BP 120/78   Pulse (!) 102   Temp 36.7 C (98.1 F)   Resp 13   Ht 1.753 m ('5\' 9"'$ )   Wt 86.9 kg (191 lb 9.3 oz)   SpO2 96%   BMI 28.29 kg/m   General:  Overall, the patient appears stated age.  He appears acutely ill, but non-toxic. He is in no acute distress  Eyes:  Conjunctiva are clear bilaterally.  Pupils are equal and round.  Sclera are non-icteric.  HENT:  Head is atraumatic and normocephalic.  Mucous membranes are moist.  There are no oral lesions.  Cardiovascular:  Heart is regular rate and rhythm with a normal S1 and S2.  There are no murmurs.  Respiratory:  Lungs are clear to auscultation bilaterally.  Abdomen:  There is a vertical midline incision in place with no surrounding erythema or induration.  There is drainage of fecal material from the inferior aspect of the incision. There are two drains in place on the right including a JP drain and a pigtail drain with sanguinous appearing output.  Mildly distended.  Diffusely tender to palpation.  Bowel sounds are normoactive.  Extremities:  There is no joint erythema or  swelling.  There is no cyanosis or edema.  Skin:  There are no diffuse rashes.  Neurologic:  Patient is awake, alert, and oriented x 3.  CN II-XII are in tact.  There are no focal deficits.    CURRENT ANTIMICROBIAL THERAPY:  Cefepime 2 g IV q.12 hours  Metronidazole 500 mg p.o. B.i.d.  Vancomycin 1 g IV q.12 hours  Genvoya 150-150-200-10 mg 1 tablet p.o. Daily    ACCESS SITES:  Patient Lines/Drains/Airways Status     Active Line / Dialysis Catheter / Dialysis Graft / Drain / Airway / Wound     Name Placement date Placement time Site Days    Peripheral IV Ultrasound guided Proximal;Right Basilic  (medial side of arm) 04/06/21  0958  -- 3    Peripheral IV Ultrasound guided Anterior;Distal;Left;Upper Arm 04/09/21  1023  -- less than 1    Peripheral IV Ultrasound guided Distal;Right Forearm 04/09/21  1030  -- less than 1    Midline Double Lumen Lumen 1 Red Lumen 2 Purple Right;Basilic Vein NOT A CENTRAL LINE 04/07/21  1745  4 FR  2    [employer] Drain Right;Upper Abdomen 04/03/21  1055  -- 6    Drain (Miscellaneous) #82F pigtail catheter for RLQ fluid collection Lower;Right;Anterior Abdomen 04/09/21  1433  -- less than 1    Surgical Incision Abdomen  04/03/21  1046  -- 6    Surgical Incision Other (Comment) Left;Upper Abdomen 04/03/21  1400  -- 6                LABORATORY STUDIES:   Results for orders placed or performed during the hospital encounter of 04/03/21 (from the past 24 hour(s))   WOUND, SUPERFICIAL/NON-STERILE SITE, AEROBIC CULTURE AND GRAM STAIN    Specimen: Wound; Other   Result Value Ref Range    GRAM STAIN 1+ Rare PMNs     GRAM STAIN 3+ Several Gram Positive Cocci    H & H   Result Value Ref Range    HGB 9.3 (L) 13.4 - 17.5 g/dL    HCT 28.5 (L) 38.9 - 52.0 %   TEG, RAPID GLOBAL WITH LYSIS (TRAUMA)   Result Value Ref Range    R (CK) 6.4 4.6 - 9.1 min    LYS30 (%) 0.0 0.0 - 2.6 %    MA (CRT RAPID) >75.0 (H) 52.0 - 70.0 mm    MA FIBRINOGEN (CFF) >52.0 (H) 15.0 - 32.0 mm   CBC/DIFF *Canceled*    Narrative     The following orders were created for panel order CBC/DIFF.  Procedure                               Abnormality         Status                     ---------                               -----------         ------                       Please view results for these tests on the individual orders.   H & H   Result Value Ref Range    HGB 8.9 (L) 13.4 - 17.5 g/dL    HCT 27.8 (L) 38.9 - 52.0 %   LACTIC ACID LEVEL W/ REFLEX FOR LEVEL >2.0   Result Value Ref Range    LACTIC ACID 0.8 0.5 - 2.2 mmol/L   TEG, RAPID GLOBAL WITH LYSIS (TRAUMA)   Result Value Ref Range    R (CK) 5.7 4.6 - 9.1 min    LYS30 (%) 0.1 0.0 - 2.6 %    MA (CRT RAPID) >75.0 (H) 52.0 - 70.0 mm    MA FIBRINOGEN (CFF) >52.0 (H) 15.0 - 32.0 mm   CBC/DIFF    Narrative    The following orders were created for panel order CBC/DIFF.  Procedure                               Abnormality         Status                     ---------                               -----------         ------  CBC WITH DIFF[[id number]]                Abnormal            Final result               MANUAL DIFF AND MORPHOLO.Marland KitchenMarland Kitchen[[id number]]  Abnormal            Final result                 Please view results for these tests on the individual orders.   BASIC METABOLIC PANEL   Result Value Ref Range    SODIUM 133 (L) 136 - 145 mmol/L    POTASSIUM 4.6 3.5 - 5.1 mmol/L    CHLORIDE 100 96 - 111 mmol/L    CO2 TOTAL 25 23 - 31 mmol/L    ANION GAP 8 4 - 13 mmol/L    CALCIUM 8.1 (L) 8.8 - 10.2 mg/dL    GLUCOSE 109 65 - 125 mg/dL    BUN 14 8 - 25 mg/dL    CREATININE 0.71 (L) 0.75 - 1.35 mg/dL    BUN/CREA RATIO 20 6 - 22    ESTIMATED GFR >90 >=60 mL/min/BSA   PHOSPHORUS   Result Value Ref Range    PHOSPHORUS 2.2 (L) 2.3 - 4.0 mg/dL   MAGNESIUM   Result Value Ref Range    MAGNESIUM 2.0 1.8 - 2.6 mg/dL   TROPONIN-I   Result Value Ref Range    TROPONIN I <7 0 - 30 ng/L   CBC WITH DIFF   Result Value Ref Range    WBC 39.8 (H) 3.7 - 11.0 x10^3/uL    RBC 2.94 (L) 4.50 - 6.10 x10^6/uL     HGB 8.9 (L) 13.4 - 17.5 g/dL    HCT 26.8 (L) 38.9 - 52.0 %    MCV 91.2 78.0 - 100.0 fL    MCH 30.3 26.0 - 32.0 pg    MCHC 33.2 31.0 - 35.5 g/dL    RDW-CV 16.1 (H) 11.5 - 15.5 %    PLATELETS 397 150 - 400 x10^3/uL    MPV 11.2 8.7 - 12.5 fL    Narrative    White Blood CountResults reviewed .   MANUAL DIFF AND MORPHOLOGY-SYSMEX   Result Value Ref Range    NEUTROPHIL % 94 %    LYMPHOCYTE %  2 %    MONOCYTE % 3 %    EOSINOPHIL % 0 %    BASOPHIL % 0 %    METAMYELOCYTE %  0 %    MYELOCYTE % 1 %    NEUTROPHIL # 37.41 (H) 1.50 - 7.70 x10^3/uL    LYMPHOCYTE # 0.80 (L) 1.00 - 4.80 x10^3/uL    MONOCYTE # 1.19 (H) 0.20 - 1.10 x10^3/uL    EOSINOPHIL # <0.10 <=0.50 x10^3/uL    BASOPHIL # <0.10 <=0.20 x10^3/uL    NRBC FROM MANUAL DIFF 6 per 100 WBC    PLATELET CLUMPS Present (A) None    RBC MORPHOLOGY Normal RBC and PLT Morphology    VANCOMYCIN PEAK   Result Value Ref Range    VANCOMYCIN PEAK 20.9 (L) 30.0 - 40.0 ug/mL    DOSE DATE 04/09/2021     DOSE TIME 10:00 PM    CBC/DIFF    Narrative    The following orders were created for panel order CBC/DIFF.  Procedure  Abnormality         Status                     ---------                               -----------         ------                     CBC WITH DIFF[[id number]]                Abnormal            Final result               MANUAL DIFF AND MORPHOLO.Marland KitchenMarland Kitchen[[id number]]  Abnormal            Final result                 Please view results for these tests on the individual orders.   BASIC METABOLIC PANEL   Result Value Ref Range    SODIUM 130 (L) 136 - 145 mmol/L    POTASSIUM 4.3 3.5 - 5.1 mmol/L    CHLORIDE 101 96 - 111 mmol/L    CO2 TOTAL 22 (L) 23 - 31 mmol/L    ANION GAP 7 4 - 13 mmol/L    CALCIUM 8.1 (L) 8.8 - 10.2 mg/dL    GLUCOSE 126 (H) 65 - 125 mg/dL    BUN 15 8 - 25 mg/dL    CREATININE 0.75 0.75 - 1.35 mg/dL    BUN/CREA RATIO 20 6 - 22    ESTIMATED GFR >90 >=60 mL/min/BSA   MAGNESIUM   Result Value Ref Range    MAGNESIUM 2.0 1.8 - 2.6 mg/dL    PHOSPHORUS   Result Value Ref Range    PHOSPHORUS 2.0 (L) 2.3 - 4.0 mg/dL   CBC WITH DIFF   Result Value Ref Range    WBC 34.0 (H) 3.7 - 11.0 x10^3/uL    RBC 2.77 (L) 4.50 - 6.10 x10^6/uL    HGB 8.2 (L) 13.4 - 17.5 g/dL    HCT 25.3 (L) 38.9 - 52.0 %    MCV 91.3 78.0 - 100.0 fL    MCH 29.6 26.0 - 32.0 pg    MCHC 32.4 31.0 - 35.5 g/dL    RDW-CV 16.1 (H) 11.5 - 15.5 %    PLATELETS 383 150 - 400 x10^3/uL    MPV 11.1 8.7 - 12.5 fL   MANUAL DIFF AND MORPHOLOGY-SYSMEX   Result Value Ref Range    NEUTROPHIL % 90 %    LYMPHOCYTE %  4 %    MONOCYTE % 4 %    EOSINOPHIL % 0 %    BASOPHIL % 0 %    NEUTROPHIL BANDS % 1 %    METAMYELOCYTE %  1 %    MYELOCYTE % 1 %    REACTIVE LYMPHOCYTE % 0 %    NEUTROPHIL # 30.94 (H) 1.50 - 7.70 x10^3/uL    LYMPHOCYTE # 1.36 1.00 - 4.80 x10^3/uL    MONOCYTE # 1.36 (H) 0.20 - 1.10 x10^3/uL    EOSINOPHIL # <0.10 <=0.50 x10^3/uL    BASOPHIL # <0.10 <=0.20 x10^3/uL    NRBC FROM MANUAL DIFF 4 per 100 WBC    ECHINOCYTE (BURR CELL) 2+/Moderate (A) None   H & H   Result Value Ref Range  HGB 8.3 (L) 13.4 - 17.5 g/dL    HCT 26.0 (L) 38.9 - 52.0 %   TYPE AND CROSS RED CELLS - UNITS , 2 Units   Result Value Ref Range    UNITS ORDERED 6     SPECIMEN EXPIRATION DATE 04/12/2021,2359     ABO/RH(D) A NEGATIVE     ANTIBODY SCREEN NEGATIVE    POC BLOOD GLUCOSE (RESULTS)   Result Value Ref Range    GLUCOSE, POC 154 (H) 70 - 105 mg/dl   POC BLOOD GLUCOSE (RESULTS)   Result Value Ref Range    GLUCOSE, POC 117 (H) 70 - 105 mg/dl   POC BLOOD GLUCOSE (RESULTS)   Result Value Ref Range    GLUCOSE, POC 139 (H) 70 - 105 mg/dl   POC BLOOD GLUCOSE (RESULTS)   Result Value Ref Range    GLUCOSE, POC 124 (H) 70 - 105 mg/dl       MICROBIOLOGY:   Blood cultures 04/08/2021:  Positive for Pseudomonas aeruginosa in 1/2 bottles  MRSA nasal PCR screen 04/08/2021:  Positive  Blood cultures 04/09/2021:  No growth x 18-24 hours  Right upper quadrant fluid culture 04/09/2021:  Several Gram-positive cocci seen on Gram stain.  Culture  results pending.    IMAGING STUDIES:  No new imaging studies for review    ASSESSMENT:  [name] is a very pleasant 76 y.o. gentleman with a past medical history significant for HIV infection, stable on Genvoya, hypertension, hyperlipidemia. DVT/PE, MSSA right native hip septic arthritis, perforated diverticulitis s/p colectomy, hypothyroidism, and intraductal papillary mucinous neoplasm (IPMN) who was admitted on 04/03/21 with intra-abdominal hemorrhage.  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  His post-operative course was complicated by development of a PE for which he was started on Eliquis.  He presented to [facility] on 04/03/21 due to increased bleeding around his JP drain at which time a CTA showed extravasation around the pancreatic resection site.  At that facility he underwent exploratory laparotomy with ligation of bleeding vessels prior to transfer here for further management.  Following admission here, he developed worsening leukocytosis.  Blood cultures drawn on 04/08/21 are now positive for Pseudomonas aeruginosa in 1/2 sets drawn from the midline.  CT scan of the chest, abdomen, and pelvis obtained on 04/08/21 showed extensive multiloculated RUQ fluid collection and additional complex fluid collections in the pancreatectomy bed and splenectomy bed, but no active bleeding.  On 04/09/21, he underwent IR-guided RUQ peritoneal drain placement and empiric embolization of the splenic artery stump and left inferior adrenal artery.  The abdominal fluid was bloody in appearance with gram stain showing gram positive cocci.  He is currently on antimicrobial therapy with vancomycin, cefepime, and metronidazole, which he is tolerating well.        RECOMMENDATIONS:    Pseudomonas Bacteremia with Concern for Infected Intra-abdominal Hematoma  1. Continue to follow final results of blood  cultures from 04/08/21  2. Continue to follow pending blood cultures from 04/09/21, which are no growth to date  3. Continue antimicrobial therapy with cefepime.  Please increase dose to 2g IV every 8 hours given isolation of Pseudomonas  4. Continue to follow culture results of abdominal fluid.  I have requested that the microbiology lab process this as a sterile specimen  5. Continue antimicrobial therapy with vancomycin with dose per pharmacy protocol  6. Continue antimicrobial therapy with metronidazole dosed 500  mg BID  7. Please continue to monitor CBC with differential, BUN, creatinine, hepatic function panel, and CRP while the patient is on antimicrobial therapy.    HIV Infection  1. CD4 count obtained on 04/03/21 was low at 117 with preserved percentage and CD4:CD8 ratio.  2. Recommend obtaining repeat CD4/CD8 count  3. Please continue antiretroviral therapy with Genvoya.  Please check for any drug interactions with this agent prior to initiation of new medications  4. Please send quantitative HIV PCR    Thank you for this consultation.  We will continue to follow    On the day of the encounter, a total of (30) minutes was spent in direct/indirect care of this patient including evaluation, review and interpretation of laboratory and microbiology studies, review of radiology studies and the medical record, order entry and coordination of care. counseling and educating the patient/family/caregiver, and documentation.    MDM Level    1. Problems addressed: High (>or= chronic illness w severe exacerbation, progression, or treatment side-effects; 1 acute or chronic illness or injury that poses a threat to life or bodily function): Pseudomonas bacteremia, HIV infection, Intra-abdominal infected hematoma  2. Data reviewed & analyzed: High: 2 of 3 following: review of external notes, review of tests results, ordering of tests, assessment requiring an independent historian; independent interpretation of a test performed  by another provider not separately reported; discussion of management or test interpretation w external provider: I have reviewed all laboratory studies, microbiology data, and discussed the plan of care with the SICU team  3. Risk of morbidity: High (ex: drug therapy requiring intensive monitoring, decision regarding major surgery with risk factors, decision regarding hospitalization or escalation of hospital level of care): The patient is receiving IV antimicrobial therapy, which requires intensive monitoring      Valorie Roosevelt, MD, PhD  Associate Professor  Department of Internal Medicine  Section of Infectious Diseases  [facility]

## 2021-04-10 NOTE — Pharmacy (Addendum)
[facility] / Department of Pharmaceutical Services  Therapeutic Drug Monitoring: Vancomycin  04/10/2021      Patient name: [name], [name]  Date of Birth:  November 14, 1945    Actual Weight:  Weight: 86.9 kg (191 lb 9.3 oz) (04/10/21 0000)     BMI:  BMI (Calculated): 28.35 (04/10/21 0000)      Date RPh Current regimen (including mg/kg) Indication &  Organism AUC or trough based dosing Target Levels^ SCr (mg/dL) CrCl* (mL/min) Infectious Laboratory Markers (as applicable)   Measured level(s)   (mcg/mL) Calculated AUC (if AUC based monitoring) Plan & predicted AUC/trough if initial dosing (including when levels are due) Comments   3/9 Sarah Initiating Intra-abdominal AUC 400-600 0.69 92.5 WBC: 35.5     - Initiate vancomycin 1g q12h (~13 mg/kg)   - predicted AUC 453  - levels in 48 hrs if continued  - MRSA swab ordered in case of concern for pneumonia    03/11 1154 kop Vancomycin 1gm q12h IAB/bacteremia AUC  0.8 ~92 34.0 Pk 20.9 (ext to 22.5)  Tr 12.0 (ext to 11.95) 402 -AUC in low end of acceptable range  -SCR has increased slightly so will continue same dose for now to avoid accummulation - cefepime increased to q8h per ID rec-growing PSA in blood  -monitor renal closely  +flagyl                                                                                  ^Target levels depends on dosing and monitoring method, AUC vs. trough based. For AUC based dosing units are mg*h/L. For trough based dosing units are mcg/mL.     *Creatinine clearance is estimated by using the Cockcroft-Gault equation for adult patients and the Carol Ada for pediatric patients.    The decision to discontinue vancomycin therapy will be determined by the primary service.  Please contact the pharmacist with any questions regarding this patient's medication regimen.

## 2021-04-10 NOTE — Progress Notes (Signed)
[facility]                                               SICU PROGRESS NOTE    [name], [name]  Date of Admission:  04/03/2021  Date of Service: 04/10/2021  Date of Birth:  06-03-1945    Primary Attending:  Cyndi Bender, MD  Primary Service:  Surgery Oncology     Post Op Day:1 Day Post-Op S/P Procedure(s) (LRB):  IR EMBOLIZATION/BODY (N/A)  IR ABSCESS/FLUID DRAINAGE OF(PAFD) (N/A)     LOS: 7 days      Subjective:    NAEO. RUQ drain putting out dark blood products with unchanged and persistent tachycardia through night.     Vital Signs:  Temp (24hrs) Max:37.1 C (72.0 F)      Systolic (94BSJ), GGE:366 , Min:105 , QHU:765     Diastolic (46TKP), TWS:56, Min:58, Max:99    Temp  Avg: 36.9 C (98.4 F)  Min: 36.7 C (98.1 F)  Max: 37.1 C (98.8 F)  MAP (Non-Invasive)  Avg: 92 mmHG  Min: 72 mmHG  Max: 110 mmHG  Pulse  Avg: 107.3  Min: 97  Max: 125  Resp  Avg: 19.3  Min: 12  Max: 34  SpO2  Avg: 91.4 %  Min: 87 %  Max: 96 %       Meds  No current outpatient medications on file.       Physical Exam:  General: Acutely ill, stable  Eyes:  Conjunctiva clear  HENT:  NCAT, Mucous membranes moist  Lungs:  CTAB, Normal respiratory effort  Cardiovascular:  RR  Abdomen:  S, Tender most pronounced in RUQ, ND. IR drain in place RUQ with bloody output. RLQ JP with dark SS/bloody drainage. Lower midline wound putting out liquid stool and nonbloody  Extremities:  No cyanosis or edema.  Skin:  Skin warm and dry.  Neurologic:  Alert and oriented x3, grossly normal  Psychiatric:  Affect normal.    Labs:  Lab Results Today:    Results for orders placed or performed during the hospital encounter of 04/03/21 (from the past 24 hour(s))   H & H   Result Value Ref Range    HGB 9.3 (L) 13.4 - 17.5 g/dL    HCT 28.5 (L) 38.9 - 52.0 %   TEG, RAPID GLOBAL WITH LYSIS (TRAUMA)   Result Value Ref Range    R (CK) 6.4 4.6 - 9.1 min    LYS30 (%) 0.0 0.0 - 2.6 %    MA (CRT RAPID) >75.0 (H) 52.0 - 70.0  mm    MA FIBRINOGEN (CFF) >52.0 (H) 15.0 - 32.0 mm   TYPE AND CROSS RED CELLS - UNITS , 2 Units   Result Value Ref Range    UNITS ORDERED 6     SPECIMEN EXPIRATION DATE 04/12/2021,2359     ABO/RH(D) A NEGATIVE     ANTIBODY SCREEN NEGATIVE    POC BLOOD GLUCOSE (RESULTS)   Result Value Ref Range    GLUCOSE, POC 154 (H) 70 - 105 mg/dl   LACTIC ACID LEVEL W/ REFLEX FOR LEVEL >2.0   Result Value Ref Range    LACTIC ACID 0.8 0.5 - 2.2 mmol/L   TEG, RAPID GLOBAL WITH LYSIS (TRAUMA)   Result Value Ref Range    R (CK) 5.7 4.6 - 9.1 min  LYS30 (%) 0.1 0.0 - 2.6 %    MA (CRT RAPID) >75.0 (H) 52.0 - 70.0 mm    MA FIBRINOGEN (CFF) >52.0 (H) 15.0 - 32.0 mm   BASIC METABOLIC PANEL   Result Value Ref Range    SODIUM 133 (L) 136 - 145 mmol/L    POTASSIUM 4.6 3.5 - 5.1 mmol/L    CHLORIDE 100 96 - 111 mmol/L    CO2 TOTAL 25 23 - 31 mmol/L    ANION GAP 8 4 - 13 mmol/L    CALCIUM 8.1 (L) 8.8 - 10.2 mg/dL    GLUCOSE 109 65 - 125 mg/dL    BUN 14 8 - 25 mg/dL    CREATININE 0.71 (L) 0.75 - 1.35 mg/dL    BUN/CREA RATIO 20 6 - 22    ESTIMATED GFR >90 >=60 mL/min/BSA   PHOSPHORUS   Result Value Ref Range    PHOSPHORUS 2.2 (L) 2.3 - 4.0 mg/dL   MAGNESIUM   Result Value Ref Range    MAGNESIUM 2.0 1.8 - 2.6 mg/dL   TROPONIN-I   Result Value Ref Range    TROPONIN I <7 0 - 30 ng/L   CBC WITH DIFF   Result Value Ref Range    WBC 39.8 (H) 3.7 - 11.0 x10^3/uL    RBC 2.94 (L) 4.50 - 6.10 x10^6/uL    HGB 8.9 (L) 13.4 - 17.5 g/dL    HCT 26.8 (L) 38.9 - 52.0 %    MCV 91.2 78.0 - 100.0 fL    MCH 30.3 26.0 - 32.0 pg    MCHC 33.2 31.0 - 35.5 g/dL    RDW-CV 16.1 (H) 11.5 - 15.5 %    PLATELETS 397 150 - 400 x10^3/uL    MPV 11.2 8.7 - 12.5 fL   MANUAL DIFF AND MORPHOLOGY-SYSMEX   Result Value Ref Range    NEUTROPHIL % 94 %    LYMPHOCYTE %  2 %    MONOCYTE % 3 %    EOSINOPHIL % 0 %    BASOPHIL % 0 %    METAMYELOCYTE %  0 %    MYELOCYTE % 1 %    NEUTROPHIL # 37.41 (H) 1.50 - 7.70 x10^3/uL    LYMPHOCYTE # 0.80 (L) 1.00 - 4.80 x10^3/uL    MONOCYTE # 1.19 (H) 0.20  - 1.10 x10^3/uL    EOSINOPHIL # <0.10 <=0.50 x10^3/uL    BASOPHIL # <0.10 <=0.20 x10^3/uL    NRBC FROM MANUAL DIFF 6 per 100 WBC    PLATELET CLUMPS Present (A) None    RBC MORPHOLOGY Normal RBC and PLT Morphology    POC BLOOD GLUCOSE (RESULTS)   Result Value Ref Range    GLUCOSE, POC 117 (H) 70 - 105 mg/dl   WOUND, SUPERFICIAL/NON-STERILE SITE, AEROBIC CULTURE AND GRAM STAIN    Specimen: Wound; Other   Result Value Ref Range    GRAM STAIN 1+ Rare PMNs     GRAM STAIN 3+ Several Gram Positive Cocci    H & H   Result Value Ref Range    HGB 8.9 (L) 13.4 - 17.5 g/dL    HCT 27.8 (L) 38.9 - 52.0 %   VANCOMYCIN PEAK   Result Value Ref Range    VANCOMYCIN PEAK 20.9 (L) 30.0 - 40.0 ug/mL    DOSE DATE 04/09/2021     DOSE TIME 10:00 PM    BASIC METABOLIC PANEL   Result Value Ref Range    SODIUM 130 (L) 136 -  145 mmol/L    POTASSIUM 4.3 3.5 - 5.1 mmol/L    CHLORIDE 101 96 - 111 mmol/L    CO2 TOTAL 22 (L) 23 - 31 mmol/L    ANION GAP 7 4 - 13 mmol/L    CALCIUM 8.1 (L) 8.8 - 10.2 mg/dL    GLUCOSE 126 (H) 65 - 125 mg/dL    BUN 15 8 - 25 mg/dL    CREATININE 0.75 0.75 - 1.35 mg/dL    BUN/CREA RATIO 20 6 - 22    ESTIMATED GFR >90 >=60 mL/min/BSA   MAGNESIUM   Result Value Ref Range    MAGNESIUM 2.0 1.8 - 2.6 mg/dL   PHOSPHORUS   Result Value Ref Range    PHOSPHORUS 2.0 (L) 2.3 - 4.0 mg/dL   CBC WITH DIFF   Result Value Ref Range    WBC 34.0 (H) 3.7 - 11.0 x10^3/uL    RBC 2.77 (L) 4.50 - 6.10 x10^6/uL    HGB 8.2 (L) 13.4 - 17.5 g/dL    HCT 25.3 (L) 38.9 - 52.0 %    MCV 91.3 78.0 - 100.0 fL    MCH 29.6 26.0 - 32.0 pg    MCHC 32.4 31.0 - 35.5 g/dL    RDW-CV 16.1 (H) 11.5 - 15.5 %    PLATELETS 383 150 - 400 x10^3/uL    MPV 11.1 8.7 - 12.5 fL   MANUAL DIFF AND MORPHOLOGY-SYSMEX   Result Value Ref Range    NEUTROPHIL % 90 %    LYMPHOCYTE %  4 %    MONOCYTE % 4 %    EOSINOPHIL % 0 %    BASOPHIL % 0 %    NEUTROPHIL BANDS % 1 %    METAMYELOCYTE %  1 %    MYELOCYTE % 1 %    REACTIVE LYMPHOCYTE % 0 %    NEUTROPHIL # 30.94 (H) 1.50 - 7.70 x10^3/uL     LYMPHOCYTE # 1.36 1.00 - 4.80 x10^3/uL    MONOCYTE # 1.36 (H) 0.20 - 1.10 x10^3/uL    EOSINOPHIL # <0.10 <=0.50 x10^3/uL    BASOPHIL # <0.10 <=0.20 x10^3/uL    NRBC FROM MANUAL DIFF 4 per 100 WBC    ECHINOCYTE (BURR CELL) 2+/Moderate (A) None   POC BLOOD GLUCOSE (RESULTS)   Result Value Ref Range    GLUCOSE, POC 139 (H) 70 - 105 mg/dl   H & H   Result Value Ref Range    HGB 8.3 (L) 13.4 - 17.5 g/dL    HCT 26.0 (L) 38.9 - 52.0 %   POC BLOOD GLUCOSE (RESULTS)   Result Value Ref Range    GLUCOSE, POC 124 (H) 70 - 105 mg/dl       Recent Imaging:  Results for orders placed or performed during the hospital encounter of 04/03/21 (from the past 72 hour(s))   XR AP MOBILE CHEST     Status: None    Narrative    ANVAY TENNIS Pecha  Male, 76 years old.    XR AP MOBILE CHEST performed on 04/07/2021 10:32 AM.    REASON FOR EXAM:  Concern for pneumonia    TECHNIQUE: 1 views/1 images submitted for interpretation.    COMPARISON:  04/06/2021    FINDINGS:  Stable appearing elevation right hemidiaphragm. Bibasilar opacity most suggestive of atelectasis unchanged from prior. No new focal lung consolidation. Small left-sided effusion. Cardiac contour is unchanged. There is no pneumothorax.      Impression    Small  volume left-sided effusion. Bibasilar opacity. No detrimental interval change from prior.   CT CHEST ABDOMEN PELVIS W IV CONTRAST     Status: None    Narrative    TYSHAUN VINZANT Matsuura  Male, 76 years old.    CT CHEST ABDOMEN PELVIS W IV CONTRAST performed on 04/08/2021 1:22 PM.    REASON FOR EXAM:  Elevated WBC    TECHNIQUE: Axial CT images were obtained through the chest, abdomen, and pelvis following the intravenous administration of contrast. Coronal and sagittal reformatted images were created.    RADIATION DOSE: 1343.80 mGycm  CONTRAST: 83 mL of Isovue 370    COMPARISON: CT of the chest, abdomen, and pelvis performed March 31, 2021.    FINDINGS:     CHEST:    LUNGS: Bibasilar atelectasis is seen in association with bilateral  pleural effusions, right greater than left. The large central airways are patent.    HEART/MEDIASTINUM: The heart is within normal limits in size. The thoracic aorta is within normal limits in caliber and contour. There is redemonstration of subocclusive filling defects involving the right main pulmonary artery as well as several segmental right-sided branches, similar to March 31, 2021.    PLEURA: Small bilateral, partially loculated pleural effusions are present, right greater than left.    LYMPH NODES: No abnormally enlarged lymph nodes are detected in the chest.    BONES/SOFT TISSUES: Diminished bone mineral density is noted. No acute bony abnormalities are detected within the chest.      ABDOMEN/PELVIS:     LIVER: The liver is within normal limits in size. There are extensive smooth lobular contour changes of the liver due to a large amount of complex perihepatic fluid which demonstrates areas of increased density as well as apparent septations. Some of this complex fluid extends inferiorly into the right abdomen and along the gallbladder fossa. This large collection measures up to at least 17 cm in its greatest craniocaudal extent.     BILIARY SYSTEM/GALLBLADDER: The gallbladder is normal in size. No intra or extrahepatic bile duct dilation is present.    PANCREAS: The patient is status post recent distal pancreatectomy. There is a moderate amount of complex fluid seen along the suture line at the distal pancreatectomy bed, series 15 image 43, measuring 7.3 x 5.8 cm. This fluid is likely contiguous with the low-density fluid in the splenectomy bed.    KIDNEY/URETERS: The kidneys are normal in size with symmetric parenchymal enhancement. No hydronephrosis or solid renal mass is present. There are small low-density renal masses bilaterally which are too small to definitively characterize but most likely represent small cysts.    ADRENALS: Within normal limits.    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 There is  some loculated fluid in the left upper quadrant with an indwelling surgical drain in this region.    BOWEL: The stomach is incompletely distended. There is extrinsic compression on the distal stomach and proximal duodenum due to the complex fluid collections in the upper abdomen described above. No abnormally dilated loops of bowel are distinct transition points are detected to suggest bowel obstruction.    VASCULATURE/LYMPH NODES: The abdominal aorta is normal in caliber and contour. The portal and superior mesenteric veins are patent.    BLADDER: The bladder is mildly distended. No bladder wall thickening is visible.    REPRODUCTIVE ORGANS: The prostate appears normal in size.    PERITONEAL CAVITY: As noted above, there is complex multiloculated fluid with increased density seen predominantly in  the right upper quadrant. Additional lower density ill-defined fluid is noted in the lesser sac and left upper quadrant. These collections are as described above.    BONES/SOFT TISSUES: Diffuse body wall edema is present. 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 There are mild multilevel degenerative changes of the lumbar spine. No acute or destructive bony abnormality is seen.      Impression    1. Extensive multiloculated, increased density perihepatic and right upper quadrant fluid collection with an imaging appearance most concerning for evolving hematoma/hemoperitoneum given the setting of recent hemorrhage. No internal air is visible within this collection to specifically suggest superimposed infection.    2. Additional complex fluid collections centered at the recent distal pancreatectomy bed and within the splenectomy bed could also represent evolving postoperative fluid, although, raises concern for a pancreatic leak in the appropriate context.    3. Redemonstration of a subocclusive, subsegmental right-sided pulmonary emboli, similar to March 31, 2021.    4.  Small, partially loculated bilateral pleural effusions with bibasilar atelectasis.     CT ANGIO ABDOMEN PELVIS W/WO IV CONTRAST     Status: None (Preliminary result)    Narrative    CT ANGIO ABDOMEN PELVIS W/WO IV CONTRAST performed on 04/09/2021 11:49 AM    INDICATION: 76 years old Male  Abdominal bleeding    TECHNIQUE: Axial images from lung bases through upper thighs followed by intravenous administration of nonionic contrast, with additional multiplanar images in early arterial and venous phases. Arterial coronal and sagittal reformats utilize MIP reconstruction techniques    CONTRAST: 100 cc of Isovue 370    RADIATION DOSE: 2987.10 mGycm    COMPARISON: CT chest abdomen pelvis 04/08/2021.    FINDINGS:  VASCULAR: The abdominal aorta is of normal caliber.  The celiac, superior mesenteric, and inferior mesenteric arteries are patent.  The bilateral renal arteries are patent.  The iliofemoral vessels are normal caliber.  Mild atherosclerotic disease is present. Note is made of a pulmonary embolism involving the right upper and lower lobes. This was present on CT chest abdomen pelvis 03/31/2020. The portal vein and IVC are patent.    NONVASCULAR:  Lung Bases: Greater than right pleural effusions with associated atelectasis are identified.    Liver: Diffuse fatty change of the liver is present.   There is redemonstration of a loculated, complex, perihepatic collection resulting in mass effect on the hepatic parenchyma. Overall this is similar in appearance relative to CT 04/08/2021.    Gallbladder/Biliary System: No hyperattenuating foci are present within the gallbladder lumen.  No bile duct dilation is seen.    Spleen: The spleen is surgically absent. Nonloculated fluid as well as surgical train are present within the left upper quadrant.    Pancreas: There are postoperative changes from distal pancreatectomy with postoperative fluid similar in appearance relative to comparison study.    Adrenals: The adrenal glands are  grossly unremarkable.    Kidney/Ureters/Bladder: The kidneys are symmetric in size. Bilateral simple to minimally complex cysts are again noted. There is no evidence of hydronephrosis. Mild bilateral perinephric stranding is unchanged. The ureters are of normal caliber.  The bladder is partially distended limiting evaluation..    Reproductive Organs: The prostate is normal in size.    Bowel: There is narrowing of the pylorus and first portion of the duodenum secondary to extrinsic compression by multiple abdominal fluid collections. This is unchanged. Fluid collection along the greater curvature is redemonstrated and unchanged. Enteric  contrast is noted throughout the large bowel and present to the level the rectum. No abnormally dilated loops of bowel are identified to suggest obstruction.    Lymph Nodes: No suspicious lymph nodes are seen throughout the abdomen and pelvis.    Peritoneal Cavity: Multiple complex fluid collections within the right upper quadrant as well as fluid throughout the mid abdomen and left upper quadrant are present and described above. A small amount of residual pneumoperitoneum is noted and decreased relative to comparison study. Postoperative changes along the abdominal midline wall are present.    Soft Tissues: Anasarca.    Bones: Vertebral body height and alignment is maintained. Bilateral femoral head avascular necrosis is noted.        Impression    1.No acute bleed.  2.Stable multiloculated right perihepatic collection and fluid collections within the mid abdomen and left upper quadrant.  3.Partially imaged subocclusive, segmental, right pulmonary emboli previously demonstrated on CT 03/31/2021.  4.Right greater than left pleural effusion and atelectasis.         Assessment/ Plan:  Active Hospital Problems    Diagnosis   . Primary Problem: Intra abdominal hemorrhage       [name] is a 76 y.o. male who is 1 Day Post-Op S/P Procedure(s) (LRB):  IR EMBOLIZATION/BODY (N/A)  IR  ABSCESS/FLUID DRAINAGE OF(PAFD) (N/A)    Lines: R PIV, R midline     NEURO:  GCS: E4=Spontaneous (Opens Eyes on Own) M6=Normal (Follows Simple Commands) V5=Normal Conversation  Imaging: None  Neurochecks q 4 hr    SBP < 160  Sedation/analgesia: Tylenol, Dilaudid, Robaxin, Oxy 5/10, Neurontin     Plan:  - Continue current pain regimen as his pain is controlled    CARDIOVASCULAR:  Systolic (78MVE), HMC:947 , Min:105 , SJG:283     Diastolic (66QHU), TML:46, Min:58, Max:99     Troponins: No results found for: CKMB   Meds: Atorvastatin   Pressors: None    Plan:  - Continue home Atorvastatin   - Continue to monitor hemodynamics on telemetry          PULMONARY:  Airway Ventilator Settings     Not on Ventilator   SpO2  Avg: 91.4 %  Min: 87 %  Max: 96 %  Blood Gas:  No results found for this encounter  Nebs: None  Imaging: None    Plan:   - Provide with IS and pulmonary toilet and wean NC as tolerated    GI:  MNT PROTOCOL FOR DIETITIAN  ROOM SERVICE:  NEEDS VISIT FOR MENU CHOICES  MNT PROTOCOL FOR DIETITIAN  DIET NPO - NOW STRICT, EXCEPT ALL MEDS WITH SIPS OF WATER   Recent Labs     04/08/21  0652 04/09/21  0342   BILIRUBIN Negative  --    ALBUMIN  --  1.6*     Last BM: Date of Last Bowel Movement: 04/09/21  Proph: Colace, Senokot, Protonix     Plan:  -Remain NPO with sips and chips with meds        RENAL/GU:  Recent Labs     04/08/21  0652 04/09/21  0342 04/09/21  1633 04/10/21  0130   SODIUM  --  134* 133* 130*   POTASSIUM  --  4.8 4.6 4.3   CHLORIDE  --  99 100 101   BUN  --  '14 14 15   '$ CREATININE  --  0.74* 0.71* 0.75   GLUCOSE Negative  --   --   --  ANIONGAP  --  '10 8 7   '$ CALCIUM  --  8.9 8.1* 8.1*   MAGNESIUM  --  2.0 2.0 2.0   PHOSPHORUS  --  2.3 2.2* 2.0*       I/O last 24 hours:      Intake/Output Summary (Last 24 hours) at 04/10/2021 0645  Last data filed at 04/10/2021 0600  Gross per 24 hour   Intake 2560 ml   Output 3075 ml   Net -515 ml     I/O current shift:  03/10 1900 - 03/11 0659  In: 1275 [I.V.:1275]  Out:  1485 [Urine:1000; Drains:485]  IV fluids: Plasmalyte @ 75 ml/hr    Plan:  - Continue will change to D5 with plasmalyte for MIVF    HEME:  Recent Labs     04/08/21  1630 04/08/21  1718 04/08/21  2351 04/09/21  0342 04/09/21  1017 04/09/21  1633 04/09/21  2021 04/10/21  0130 04/10/21  0503   HGB  --   --   --  12.0*   < > 8.9* 8.9* 8.2* 8.3*   HCT  --   --   --  37.3*   < > 26.8* 27.8* 25.3* 26.0*   PLTCNT  --   --   --  434*  --  397  --  383  --    APTT 98.1* 58.2* 52.7*  --   --   --   --   --   --     < > = values in this interval not displayed.     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  Pancreatic leak with collection and now stool drainage from midline incision.  Following ID recs, monitor stool output as per primary.  Midline left in place as unlikely source but will DC if Bcx remain positive.     I was present at the bedside of this critically ill patient for 60 minutes exclusive of procedures.  This patient suffers from failure or dysfunction of 4 system(s).  The care of this patient was in regard to managing (a) conditions(s) that has a high probability of sudden, clinically significant, or life-threatening deterioration and required a high degree of Attending Physician attention and direct involvement to intervene urgently. Data review and care planning was performed in direct proximity of the patient, examination was obviously performed in direct contact with the patient. All of this time was exclusive of procedure which will be documented elsewhere in the chart.    My critical care time involved full attention to the patients' condition and included:    Review of nursing notes and/or old charts  Review of medications, allergies, and vital signs  Documentation time  Consultant collaboration on findings and treatment options  Care, transfer of care, and discharge plans  Ordering, interpreting, and reviewing diagnostic studies/tab tests  Obtaining necessary history from family, EMS, nursing home staff and/or treating physicians    My critical care time did not include time spent teaching resident physician(s) or other services of resident physicians, or performing other reported procedures.  Total Critical Care Time: 60 minutes    Electronically Signed:   Larna Daughters, MD 04/10/2021, 15:25      I saw and examined the patient.  I reviewed the resident's note.  I agree with the findings and plan of care as documented in the resident's note.  Any exceptions/additions are  edited/noted.    Larna Daughters, MD

## 2021-04-11 DIAGNOSIS — R1013 Epigastric pain: Secondary | ICD-10-CM

## 2021-04-11 LAB — POC BLOOD GLUCOSE (RESULTS)
GLUCOSE, POC: 121 mg/dl — ABNORMAL HIGH (ref 70–105)
GLUCOSE, POC: 333 mg/dl — ABNORMAL HIGH (ref 70–105)
GLUCOSE, POC: 157 mg/dl — ABNORMAL HIGH (ref 70–105)

## 2021-04-11 LAB — CBC WITH DIFF
HCT: 22.7 % — ABNORMAL LOW (ref 38.9–52.0)
HCT: 28.4 % — ABNORMAL LOW (ref 38.9–52.0)
HGB: 7.3 g/dL — ABNORMAL LOW (ref 13.4–17.5)
HGB: 9 g/dL — ABNORMAL LOW (ref 13.4–17.5)
MCH: 29.3 pg (ref 26.0–32.0)
MCH: 29.2 pg (ref 26.0–32.0)
MCHC: 32.2 g/dL (ref 31.0–35.5)
MCHC: 31.7 g/dL (ref 31.0–35.5)
MCV: 91.2 fL (ref 78.0–100.0)
MCV: 92.2 fL (ref 78.0–100.0)
MPV: 11.3 fL (ref 8.7–12.5)
MPV: 11.1 fL (ref 8.7–12.5)
PLATELETS: 412 10*3/uL — ABNORMAL HIGH (ref 150–400)
PLATELETS: 513 10*3/uL — ABNORMAL HIGH (ref 150–400)
RBC: 2.49 10*6/uL — ABNORMAL LOW (ref 4.50–6.10)
RBC: 3.08 10*6/uL — ABNORMAL LOW (ref 4.50–6.10)
RDW-CV: 16 % — ABNORMAL HIGH (ref 11.5–15.5)
RDW-CV: 16 % — ABNORMAL HIGH (ref 11.5–15.5)
WBC: 25.3 10*3/uL — ABNORMAL HIGH (ref 3.7–11.0)
WBC: 31.2 10*3/uL — ABNORMAL HIGH (ref 3.7–11.0)

## 2021-04-11 LAB — BASIC METABOLIC PANEL
ANION GAP: 3 mmol/L — ABNORMAL LOW (ref 4–13)
BUN/CREA RATIO: 17 (ref 6–22)
BUN: 9 mg/dL (ref 8–25)
CALCIUM: 6.4 mg/dL — CL (ref 8.8–10.2)
CHLORIDE: 109 mmol/L (ref 96–111)
CO2 TOTAL: 23 mmol/L (ref 23–31)
CREATININE: 0.52 mg/dL — ABNORMAL LOW (ref 0.75–1.35)
ESTIMATED GFR: >90 mL/min/BSA (ref >=60)
GLUCOSE: 120 mg/dL (ref 65–125)
POTASSIUM: 3.4 mmol/L — ABNORMAL LOW (ref 3.5–5.1)
SODIUM: 135 mmol/L — ABNORMAL LOW (ref 136–145)

## 2021-04-11 LAB — MANUAL DIFF AND MORPHOLOGY-SYSMEX
BASOPHIL #: <0.1 10*3/uL (ref <=0.20)
BASOPHIL #: <0.1 10*3/uL (ref <=0.20)
BASOPHIL %: 0 %
BASOPHIL %: 0 %
EOSINOPHIL #: <0.1 10*3/uL (ref <=0.50)
EOSINOPHIL #: <0.1 10*3/uL (ref <=0.50)
EOSINOPHIL %: 0 %
EOSINOPHIL %: 0 %
LYMPHOCYTE #: 0.51 10*3/uL — ABNORMAL LOW (ref 1.00–4.80)
LYMPHOCYTE #: 0.94 10*3/uL — ABNORMAL LOW (ref 1.00–4.80)
LYMPHOCYTE %: 2 %
LYMPHOCYTE %: 3 %
METAMYELOCYTE %: 3 %
MONOCYTE #: 1.77 10*3/uL — ABNORMAL HIGH (ref 0.20–1.10)
MONOCYTE #: 1.56 10*3/uL — ABNORMAL HIGH (ref 0.20–1.10)
MONOCYTE %: 7 %
MONOCYTE %: 5 %
MYELOCYTE %: 3 %
NEUTROPHIL #: 22.52 10*3/uL — ABNORMAL HIGH (ref 1.50–7.70)
NEUTROPHIL #: 27.46 10*3/uL — ABNORMAL HIGH (ref 1.50–7.70)
NEUTROPHIL %: 89 %
NEUTROPHIL %: 87 %
NEUTROPHIL BANDS %: 1 %
NRBC FROM MANUAL DIFF: 15 per 100 WBC
NRBC FROM MANUAL DIFF: 7 per 100 WBC
RBC MORPHOLOGY: NORMAL
RBC MORPHOLOGY: NORMAL

## 2021-04-11 LAB — H & H
HCT: 27.1 % — ABNORMAL LOW (ref 38.9–52.0)
HCT: 25.4 % — ABNORMAL LOW (ref 38.9–52.0)
HGB: 8.9 g/dL — ABNORMAL LOW (ref 13.4–17.5)
HGB: 8.4 g/dL — ABNORMAL LOW (ref 13.4–17.5)

## 2021-04-11 LAB — MAGNESIUM: MAGNESIUM: 1.6 mg/dL — ABNORMAL LOW (ref 1.8–2.6)

## 2021-04-11 LAB — PTT (PARTIAL THROMBOPLASTIN TIME)
APTT: 44.3 seconds — ABNORMAL HIGH (ref 24.2–37.5)
APTT: 65.1 seconds — ABNORMAL HIGH (ref 24.2–37.5)
APTT: 76.2 seconds — ABNORMAL HIGH (ref 24.2–37.5)

## 2021-04-11 LAB — PHOSPHORUS: PHOSPHORUS: 1.3 mg/dL — ABNORMAL LOW (ref 2.3–4.0)

## 2021-04-11 MED ORDER — SODIUM PHOSPHATE 3 MMOL/ML INTRAVENOUS SOLUTION
30.0000 mmol | Freq: Once | INTRAVENOUS | Status: AC
Start: 2021-04-11 — End: 2021-04-11
  Administered 2021-04-11: 0 mmol via INTRAVENOUS
  Administered 2021-04-11: 30 mmol via INTRAVENOUS
  Filled 2021-04-11: qty 10

## 2021-04-11 MED ORDER — MAGNESIUM SULFATE 4 GRAM/100 ML (4 %) IN WATER INTRAVENOUS PIGGYBACK
4.0000 g | INJECTION | Freq: Once | INTRAVENOUS | Status: AC
Start: 2021-04-11 — End: 2021-04-11
  Administered 2021-04-11: 0 g via INTRAVENOUS
  Administered 2021-04-11: 4 g via INTRAVENOUS
  Filled 2021-04-11: qty 100

## 2021-04-11 MED ORDER — SODIUM CHLORIDE 0.9 % INTRAVENOUS SOLUTION
30.0000 mmol | Freq: Once | INTRAVENOUS | Status: AC
Start: 2021-04-11 — End: 2021-04-11
  Administered 2021-04-11: 0 mmol via INTRAVENOUS
  Administered 2021-04-11: 30 mmol via INTRAVENOUS
  Filled 2021-04-11: qty 10

## 2021-04-11 MED ORDER — CALCIUM GLUCONATE 100 MG/ML (10 %) INTRAVENOUS SOLUTION
1000.0000 mg | Freq: Once | INTRAVENOUS | Status: AC
Start: 2021-04-11 — End: 2021-04-11
  Administered 2021-04-11: 1000 mg via INTRAVENOUS
  Filled 2021-04-11: qty 10

## 2021-04-11 NOTE — Progress Notes (Signed)
[facility]                                               SICU PROGRESS NOTE    Myrle, Dues  Date of Admission:  04/03/2021  Date of Service: 04/11/2021  Date of Birth:  Sep 23, 1945    Primary Attending:  Cyndi Bender, MD  Primary Service:  Surgery Oncology     Post Op Day:2 Days Post-Op S/P Procedure(s) (LRB):  IR EMBOLIZATION/BODY (N/A)  IR ABSCESS/FLUID DRAINAGE OF(PAFD) (N/A)     LOS: 8 days      Subjective:    NAEO. RUQ IR drain putting out blood through the night and large bulb drain with dark grey output. Abdominal pain this AM in the epigastric area. NO other complaints. Started heparin yesterday at low intensity    Vital Signs:  Temp (24hrs) Max:36.9 C (27.0 F)      Systolic (78MLJ), QGB:201 , Min:106 , EOF:121     Diastolic (97JOI), TGP:49, Min:67, Max:110    Temp  Avg: 36.7 C (98.1 F)  Min: 36.6 C (97.9 F)  Max: 36.9 C (98.4 F)  MAP (Non-Invasive)  Avg: 93.7 mmHG  Min: 78 mmHG  Max: 116 mmHG  Pulse  Avg: 98.5  Min: 88  Max: 114  Resp  Avg: 18.1  Min: 10  Max: 39  SpO2  Avg: 95.7 %  Min: 92 %  Max: 98 %       Meds  No current outpatient medications on file.       Physical Exam:  General: Acutely ill, stable  Eyes:  Conjunctiva clear  HENT:  NCAT, Mucous membranes moist  Lungs:  CTAB, Normal respiratory effort  Cardiovascular:  RR  Abdomen:  S, Tender most pronounced in RUQ and epigastric area, ND. IR drain in place RUQ with bloody output. RLQ JP with dark grey drainage. Lower midline wound putting out liquid stool and nonbloody with ostomy device in place  Extremities:  No cyanosis or edema.  Skin:  Skin warm and dry.  Neurologic:  Alert and oriented x3, grossly normal  Psychiatric:  Affect normal.    Labs:  Lab Results Today:    Results for orders placed or performed during the hospital encounter of 04/03/21 (from the past 24 hour(s))   VANCOMYCIN, TROUGH   Result Value Ref Range    VANCOMYCIN TROUGH 12.0 10.0 - 20.0 ug/mL   AMYLASE BODY  FLUID   Result Value Ref Range    AMYLASE BODY FLUID >6,554 No reference intervals are established. U/L   AMYLASE BODY FLUID   Result Value Ref Range    AMYLASE BODY FLUID >6,554 No reference intervals are established. U/L   H & H   Result Value Ref Range    HGB 8.2 (L) 13.4 - 17.5 g/dL    HCT 26.0 (L) 38.9 - 52.0 %   POC BLOOD GLUCOSE (RESULTS)   Result Value Ref Range    GLUCOSE, POC 230 (H) 70 - 105 mg/dl   PTT (PARTIAL THROMBOPLASTIN TIME)   Result Value Ref Range    APTT 31.6 24.2 - 37.5 seconds   POC BLOOD GLUCOSE (RESULTS)   Result Value Ref Range    GLUCOSE, POC 175 (H) 70 - 105 mg/dl   H & H   Result Value Ref Range    HGB 9.0 (L)  13.4 - 17.5 g/dL    HCT 27.5 (L) 38.9 - 52.0 %   PTT (PARTIAL THROMBOPLASTIN TIME)   Result Value Ref Range    APTT 33.0 24.2 - 37.5 seconds   POC BLOOD GLUCOSE (RESULTS)   Result Value Ref Range    GLUCOSE, POC 121 (H) 70 - 105 mg/dl   BASIC METABOLIC PANEL   Result Value Ref Range    SODIUM 135 (L) 136 - 145 mmol/L    POTASSIUM 3.4 (L) 3.5 - 5.1 mmol/L    CHLORIDE 109 96 - 111 mmol/L    CO2 TOTAL 23 23 - 31 mmol/L    ANION GAP 3 (L) 4 - 13 mmol/L    CALCIUM 6.4 (LL) 8.8 - 10.2 mg/dL    GLUCOSE 120 65 - 125 mg/dL    BUN 9 8 - 25 mg/dL    CREATININE 0.52 (L) 0.75 - 1.35 mg/dL    BUN/CREA RATIO 17 6 - 22    ESTIMATED GFR >90 >=60 mL/min/BSA   PHOSPHORUS   Result Value Ref Range    PHOSPHORUS 1.3 (L) 2.3 - 4.0 mg/dL   MAGNESIUM   Result Value Ref Range    MAGNESIUM 1.6 (L) 1.8 - 2.6 mg/dL   PTT (PARTIAL THROMBOPLASTIN TIME) - ONCE   Result Value Ref Range    APTT 44.3 (H) 24.2 - 37.5 seconds   CBC WITH DIFF   Result Value Ref Range    WBC 25.3 (H) 3.7 - 11.0 x10^3/uL    RBC 2.49 (L) 4.50 - 6.10 x10^6/uL    HGB 7.3 (L) 13.4 - 17.5 g/dL    HCT 22.7 (L) 38.9 - 52.0 %    MCV 91.2 78.0 - 100.0 fL    MCH 29.3 26.0 - 32.0 pg    MCHC 32.2 31.0 - 35.5 g/dL    RDW-CV 16.0 (H) 11.5 - 15.5 %    PLATELETS 412 (H) 150 - 400 x10^3/uL    MPV 11.3 8.7 - 12.5 fL   MANUAL DIFF AND MORPHOLOGY-SYSMEX    Result Value Ref Range    NEUTROPHIL % 89 %    LYMPHOCYTE %  2 %    MONOCYTE % 7 %    EOSINOPHIL % 0 %    BASOPHIL % 0 %    MYELOCYTE % 3 %    NEUTROPHIL # 22.52 (H) 1.50 - 7.70 x10^3/uL    LYMPHOCYTE # 0.51 (L) 1.00 - 4.80 x10^3/uL    MONOCYTE # 1.77 (H) 0.20 - 1.10 x10^3/uL    EOSINOPHIL # <0.10 <=0.50 x10^3/uL    BASOPHIL # <0.10 <=0.20 x10^3/uL    NRBC FROM MANUAL DIFF 15 per 100 WBC    RBC MORPHOLOGY Normal RBC and PLT Morphology        Recent Imaging:  Results for orders placed or performed during the hospital encounter of 04/03/21 (from the past 72 hour(s))   CT CHEST ABDOMEN PELVIS W IV CONTRAST     Status: None    Narrative    GRAYSIN LUCZYNSKI Pat  Male, 76 years old.    CT CHEST ABDOMEN PELVIS W IV CONTRAST performed on 04/08/2021 1:22 PM.    REASON FOR EXAM:  Elevated WBC    TECHNIQUE: Axial CT images were obtained through the chest, abdomen, and pelvis following the intravenous administration of contrast. Coronal and sagittal reformatted images were created.    RADIATION DOSE: 1343.80 mGycm  CONTRAST: 83 mL of Isovue 370    COMPARISON: CT of the chest,  abdomen, and pelvis performed March 31, 2021.    FINDINGS:     CHEST:    LUNGS: Bibasilar atelectasis is seen in association with bilateral pleural effusions, right greater than left. The large central airways are patent.    HEART/MEDIASTINUM: The heart is within normal limits in size. The thoracic aorta is within normal limits in caliber and contour. There is redemonstration of subocclusive filling defects involving the right main pulmonary artery as well as several segmental right-sided branches, similar to March 31, 2021.    PLEURA: Small bilateral, partially loculated pleural effusions are present, right greater than left.    LYMPH NODES: No abnormally enlarged lymph nodes are detected in the chest.    BONES/SOFT TISSUES: Diminished bone mineral density is noted. No acute bony abnormalities are detected within the chest.      ABDOMEN/PELVIS:     LIVER: The  liver is within normal limits in size. There are extensive smooth lobular contour changes of the liver due to a large amount of complex perihepatic fluid which demonstrates areas of increased density as well as apparent septations. Some of this complex fluid extends inferiorly into the right abdomen and along the gallbladder fossa. This large collection measures up to at least 17 cm in its greatest craniocaudal extent.     BILIARY SYSTEM/GALLBLADDER: The gallbladder is normal in size. No intra or extrahepatic bile duct dilation is present.    PANCREAS: The patient is status post recent distal pancreatectomy. There is a moderate amount of complex fluid seen along the suture line at the distal pancreatectomy bed, series 15 image 43, measuring 7.3 x 5.8 cm. This fluid is likely contiguous with the low-density fluid in the splenectomy bed.    KIDNEY/URETERS: The kidneys are normal in size with symmetric parenchymal enhancement. No hydronephrosis or solid renal mass is present. There are small low-density renal masses bilaterally which are too small to definitively characterize but most likely represent small cysts.    ADRENALS: Within normal limits.    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 There is some loculated fluid in the left upper quadrant with an indwelling surgical drain in this region.    BOWEL: The stomach is incompletely distended. There is extrinsic compression on the distal stomach and proximal duodenum due to the complex fluid collections in the upper abdomen described above. No abnormally dilated loops of bowel are distinct transition points are detected to suggest bowel obstruction.    VASCULATURE/LYMPH NODES: The abdominal aorta is normal in caliber and contour. The portal and superior mesenteric veins are patent.    BLADDER: The bladder is mildly distended. No bladder wall thickening is visible.    REPRODUCTIVE ORGANS: The prostate appears normal in size.    PERITONEAL CAVITY: As noted above, there  is complex multiloculated fluid with increased density seen predominantly in the right upper quadrant. Additional lower density ill-defined fluid is noted in the lesser sac and left upper quadrant. These collections are as described above.    BONES/SOFT TISSUES: Diffuse body wall edema is present. 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 There are mild multilevel degenerative changes of the lumbar spine. No acute or destructive bony abnormality is seen.      Impression    1. Extensive multiloculated, increased density perihepatic and right upper quadrant fluid collection with an imaging appearance most concerning for evolving hematoma/hemoperitoneum given the setting of recent hemorrhage. No internal air is visible within this collection to specifically suggest  superimposed infection.    2. Additional complex fluid collections centered at the recent distal pancreatectomy bed and within the splenectomy bed could also represent evolving postoperative fluid, although, raises concern for a pancreatic leak in the appropriate context.    3. Redemonstration of a subocclusive, subsegmental right-sided pulmonary emboli, similar to March 31, 2021.    4. Small, partially loculated bilateral pleural effusions with bibasilar atelectasis.     CT ANGIO ABDOMEN PELVIS W/WO IV CONTRAST     Status: None (Preliminary result)    Narrative    CT ANGIO ABDOMEN PELVIS W/WO IV CONTRAST performed on 04/09/2021 11:49 AM    INDICATION: 76 years old Male  Abdominal bleeding    TECHNIQUE: Axial images from lung bases through upper thighs followed by intravenous administration of nonionic contrast, with additional multiplanar images in early arterial and venous phases. Arterial coronal and sagittal reformats utilize MIP reconstruction techniques    CONTRAST: 100 cc of Isovue 370    RADIATION DOSE: 2987.10 mGycm    COMPARISON: CT chest abdomen pelvis 04/08/2021.    FINDINGS:  VASCULAR: The  abdominal aorta is of normal caliber.  The celiac, superior mesenteric, and inferior mesenteric arteries are patent.  The bilateral renal arteries are patent.  The iliofemoral vessels are normal caliber.  Mild atherosclerotic disease is present. Note is made of a pulmonary embolism involving the right upper and lower lobes. This was present on CT chest abdomen pelvis 03/31/2020. The portal vein and IVC are patent.    NONVASCULAR:  Lung Bases: Greater than right pleural effusions with associated atelectasis are identified.    Liver: Diffuse fatty change of the liver is present.   There is redemonstration of a loculated, complex, perihepatic collection resulting in mass effect on the hepatic parenchyma. Overall this is similar in appearance relative to CT 04/08/2021.    Gallbladder/Biliary System: No hyperattenuating foci are present within the gallbladder lumen.  No bile duct dilation is seen.    Spleen: The spleen is surgically absent. Nonloculated fluid as well as surgical train are present within the left upper quadrant.    Pancreas: There are postoperative changes from distal pancreatectomy with postoperative fluid similar in appearance relative to comparison study.    Adrenals: The adrenal glands are grossly unremarkable.    Kidney/Ureters/Bladder: The kidneys are symmetric in size. Bilateral simple to minimally complex cysts are again noted. There is no evidence of hydronephrosis. Mild bilateral perinephric stranding is unchanged. The ureters are of normal caliber.  The bladder is partially distended limiting evaluation..    Reproductive Organs: The prostate is normal in size.    Bowel: There is narrowing of the pylorus and first portion of the duodenum secondary to extrinsic compression by multiple abdominal fluid collections. This is unchanged. Fluid collection along the greater curvature is redemonstrated and unchanged. Enteric contrast is noted throughout the large bowel and present to the level the rectum. No  abnormally dilated loops of bowel are identified to suggest obstruction.    Lymph Nodes: No suspicious lymph nodes are seen throughout the abdomen and pelvis.    Peritoneal Cavity: Multiple complex fluid collections within the right upper quadrant as well as fluid throughout the mid abdomen and left upper quadrant are present and described above. A small amount of residual pneumoperitoneum is noted and decreased relative to comparison study. Postoperative changes along the abdominal midline wall are present.    Soft Tissues: Anasarca.    Bones: Vertebral body height and alignment is maintained. Bilateral femoral head  avascular necrosis is noted.        Impression    1.No acute bleed.  2.Stable multiloculated right perihepatic collection and fluid collections within the mid abdomen and left upper quadrant.  3.Partially imaged subocclusive, segmental, right pulmonary emboli previously demonstrated on CT 03/31/2021.  4.Right greater than left pleural effusion and atelectasis.         Assessment/ Plan:  Active Hospital Problems    Diagnosis   . Primary Problem: Intra abdominal hemorrhage       EMRIK ERHARD is a 76 y.o. male who is 2 Days Post-Op S/P Procedure(s) (LRB):  IR EMBOLIZATION/BODY (N/A)  IR ABSCESS/FLUID DRAINAGE OF(PAFD) (N/A)    Lines: R PIV, R midline     NEURO:  GCS: E4=Spontaneous (Opens Eyes on Own) M6=Normal (Follows Simple Commands) V5=Normal Conversation  Imaging: None  Neurochecks q 4 hr    SBP < 160  Sedation/analgesia: Tylenol, Dilaudid, Robaxin, Oxy 5/10, Neurontin     Plan:  - Continue current pain regimen as his pain is controlled    CARDIOVASCULAR:  Systolic (16SAY), TKZ:601 , Min:106 , UXN:235     Diastolic (57DUK), GUR:42, Min:67, Max:110     Troponins: No results found for: CKMB   Meds: Atorvastatin   Pressors: None    Heart rate improved through the night and is now regular rate    Plan:  - Continue home Atorvastatin   - Continue to monitor hemodynamics on telemetry             PULMONARY:  Airway Ventilator Settings     Not on Ventilator   SpO2  Avg: 95.7 %  Min: 92 %  Max: 98 %  Blood Gas:  No results found for this encounter  Nebs: None  Imaging: None    Plan:   - Provide with IS and pulmonary toilet and wean NC as tolerated. Currently on 3 L weaned from 5 L NC    GI:  MNT PROTOCOL FOR DIETITIAN  ROOM SERVICE:  NEEDS VISIT FOR MENU CHOICES  MNT PROTOCOL FOR DIETITIAN  DIET NPO - NOW STRICT, EXCEPT ALL MEDS WITH SIPS OF WATER, EXCEPT ICE CHIPS, EXCEPT SIPS OF WATER   Recent Labs     04/09/21  0342   ALBUMIN 1.6*     Last BM: Date of Last Bowel Movement: 04/11/21  Proph: Colace, Senokot, Protonix     Plan:  -Remain NPO with sips and chips with meds  - Will discuss with service today about advancing diet        RENAL/GU:  Recent Labs     04/09/21  1633 04/10/21  0130 04/11/21  0533   SODIUM 133* 130* 135*   POTASSIUM 4.6 4.3 3.4*   CHLORIDE 100 101 109   BUN '14 15 9   '$ CREATININE 0.71* 0.75 0.52*   ANIONGAP 8 7 3*   CALCIUM 8.1* 8.1* 6.4*   MAGNESIUM 2.0 2.0 1.6*   PHOSPHORUS 2.2* 2.0* 1.3*       I/O last 24 hours:      Intake/Output Summary (Last 24 hours) at 04/11/2021 0752  Last data filed at 04/11/2021 0700  Gross per 24 hour   Intake 2278.06 ml   Output 2365 ml   Net -86.94 ml     I/O current shift:  03/12 0700 - 03/12 1859  In: 87.4 [I.V.:87.4]  Out: -   IV fluids: Plasmalyte @ 75 ml/hr    Plan:  - Will replace phosphorus, K, and Mag this  AM  - Will replace calcium    HEME:  Recent Labs     04/09/21  1633 04/09/21  2021 04/10/21  0130 04/10/21  0503 04/10/21  1306 04/10/21  1407 04/10/21  2044 04/11/21  0533   HGB 8.9*   < > 8.2*   < > 8.2*  --  9.0* 7.3*   HCT 26.8*   < > 25.3*   < > 26.0*  --  27.5* 22.7*   PLTCNT 397  --  383  --   --   --   --  412*   APTT  --   --   --   --   --  31.6 33.0 44.3*    < > = values in this interval not displayed.     Transfusions: None currently   Proph: SCD's, Heparin low intensity started yesterday     Plan:  - No need for transfusion since  embolization on 04/09/21  - Hgb trending down this AM to 7.3 from a around 8.2 yesterday   - Will continue to trend Hgb and repeat in one in 4 hours  - Transfuse as indicated     ID:  Temp (24hrs) Max:36.9 C (98.4 F)    Recent Labs     04/09/21  0342 04/09/21  1633 04/10/21  0130 04/11/21  0533   WBC 42.5* 39.8* 34.0* 25.3*   PMNS 93 94 90 89   BANDS 0  --  1  --      Blood cultures: 04/09/21- GNR with MRSA swab +  BAL: None  OR cultures:  None  ABX: Cefepime, Vanc, flagyl  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    SSI and controlled with 6 units total given yesterday    MSK:  No skin breakdown    OTHER:  Activity: HOB 30deg  PT/OT:  ordered  MNT:  ordered    PLAN:  - Continue current pain regimen   - Continue plasmalyte with D5 75 cc/hr  - Continued bloody output with of RUQ IR drain with Hgb trending down. Will pause heparin at this time.   - Afternoon H&H   - Transfuse as indicated   - Continue current ABX  - Continue home HIV therapy  - Repeat  Blood cultures tomorrow  - will remove Midline today for line holiday in anticipation of PICC placement and TPN  - Maintain NPO with sips and chips        Monia Pouch, DO  04/11/2021, 08:15        Critical Care Attestation 04/11/21    Abdominal drains continue to be bloody but H/H stable, will continue heparin gtt for PE.   Pancreatic leak with collection/stool drainage from midline incision-broad spectrum antibiotics as per ID, following cultures and appreciate management.  Will need TPN, plan for [facility] tomorrow if blood cultures remain negative.     I was present at the bedside of this critically ill patient for 45 minutes exclusive of procedures.  This patient suffers from failure or dysfunction of 4 system(s).  The care of this  patient was in regard to managing (a) conditions(s) that has a high probability of sudden,  clinically significant, or life-threatening deterioration and required a high degree of Attending Physician attention and direct involvement to intervene urgently. Data review and care planning was performed in direct proximity of the patient, examination was obviously performed in direct contact with the patient. All of this time was exclusive of procedure which will be documented elsewhere in the chart.    My critical care time involved full attention to the patients' condition and included:    Review of nursing notes and/or old charts  Review of medications, allergies, and vital signs  Documentation time  Consultant collaboration on findings and treatment options  Care, transfer of care, and discharge plans  Ordering, interpreting, and reviewing diagnostic studies/tab tests  Obtaining necessary history from family, EMS, nursing home staff and/or treating physicians    My critical care time did not include time spent teaching resident physician(s) or other services of resident physicians, or performing other reported procedures.  Total Critical Care Time: 45 minutes    Electronically Signed:   Larna Daughters, MD 04/11/2021, 14:56        I saw and examined the patient.  I reviewed the resident's note.  I agree with the findings and plan of care as documented in the resident's note.  Any exceptions/additions are edited/noted.    Larna Daughters, MD

## 2021-04-11 NOTE — Consults (Signed)
PATIENT NAME:  [name]:  [id number]  DATE OF SERVICE: 04/11/2021  DATE OF BIRTH:  07-02-1945      INFECTIOUS DISEASE CONSULT FOLLOW-UP NOTE    HOSPITAL DAY:  LOS: 8 days     REASON FOR CONSULTATION:  Gram-negative rod bacteremia and possible intra-abdominal infected hematoma in a patient with well controlled HIV infection    SUBJECTIVE: This morning, [name] reports that he is doing fairly well overall.  He does report some abdominal tenderness.  He states that he is continuing to tolerate his antimicrobial therapy well.  He denies experiencing any adverse reactions including rash, itching, or diarrhea.  He has remained afebrile.      OBJECTIVE:  Vitals:  BP 135/84   Pulse 95   Temp 36.6 C (97.9 F)   Resp (!) 10   Ht 1.753 m ('5\' 9"'$ )   Wt 88.2 kg (194 lb 7.1 oz)   SpO2 95%   BMI 28.71 kg/m   General:  Overall, the patient appears stated age.  He appears acutely ill, but non-toxic. He is in no acute distress  Eyes:  Conjunctiva are clear bilaterally.  Pupils are equal and round.  Sclera are non-icteric.  HENT:  Head is atraumatic and normocephalic.  Mucous membranes are moist.  There are no oral lesions.  Cardiovascular:  Heart is regular rate and rhythm with a normal S1 and S2.  There are no murmurs.  Respiratory:  Lungs are clear to auscultation bilaterally.  Abdomen:  There is a vertical midline incision in place with no surrounding erythema or induration.  There is drainage of fecal material from the inferior aspect of the incision consistent with enterocutaneous fistula. There are two drains in place on the right including a JP drain and a pigtail drain with sanguinous appearing output.  Mildly distended.  Diffusely tender to palpation.  Bowel sounds are normoactive.  Extremities:  There is no joint erythema or swelling.  There is no cyanosis or edema.  Skin:  There are no diffuse rashes.  Neurologic:  Patient is awake, alert, and oriented x 3.  CN II-XII are in tact.   There are no focal deficits.    CURRENT ANTIMICROBIAL THERAPY:  Cefepime 2 g IV q.8 hours  Metronidazole 500 mg p.o. B.i.d.  Vancomycin 1 g IV q.12 hours  Genvoya 150-150-200-10 mg 1 tablet p.o. Daily    ACCESS SITES:  Patient Lines/Drains/Airways Status     Active Line / Dialysis Catheter / Dialysis Graft / Drain / Airway / Wound     Name Placement date Placement time Site Days    Peripheral IV Ultrasound guided Proximal;Right Basilic  (medial side of arm) 04/06/21  0958  -- 4    Peripheral IV Ultrasound guided Anterior;Distal;Left;Upper Arm 04/09/21  1023  -- 1    Peripheral IV Ultrasound guided Distal;Right Forearm 04/09/21  1030  -- 1    Midline Double Lumen Lumen 1 Red Lumen 2 Purple Right;Basilic Vein NOT A CENTRAL LINE 04/07/21  1745  4 FR  3    [place] Drain Right;Upper Abdomen 04/03/21  1055  -- 7    Drain (Miscellaneous) #9F pigtail catheter for RLQ fluid collection Lower;Right;Anterior Abdomen 04/09/21  1433  -- 1    Surgical Incision Abdomen 04/03/21  1046  -- 7    Surgical Incision Other (Comment) Left;Upper Abdomen 04/03/21  1400  -- 7  LABORATORY STUDIES:   Results for orders placed or performed during the hospital encounter of 04/03/21 (from the past 24 hour(s))   VANCOMYCIN, TROUGH   Result Value Ref Range    VANCOMYCIN TROUGH 12.0 10.0 - 20.0 ug/mL   H & H   Result Value Ref Range    HGB 8.2 (L) 13.4 - 17.5 g/dL    HCT 26.0 (L) 38.9 - 52.0 %   AMYLASE BODY FLUID   Result Value Ref Range    AMYLASE BODY FLUID >6,554 No reference intervals are established. U/L   AMYLASE BODY FLUID   Result Value Ref Range    AMYLASE BODY FLUID >6,554 No reference intervals are established. U/L   PTT (PARTIAL THROMBOPLASTIN TIME)   Result Value Ref Range    APTT 31.6 24.2 - 37.5 seconds    Narrative    Therapeutic range for unfractionated heparin is 60-100 seconds.   H & H   Result Value Ref Range    HGB 9.0 (L) 13.4 - 17.5 g/dL    HCT 27.5 (L) 38.9 - 52.0 %   PTT (PARTIAL THROMBOPLASTIN TIME)    Result Value Ref Range    APTT 33.0 24.2 - 37.5 seconds    Narrative    Therapeutic range for unfractionated heparin is 60-100 seconds.   CBC/DIFF    Narrative    The following orders were created for panel order CBC/DIFF.  Procedure                               Abnormality         Status                     ---------                               -----------         ------                     CBC WITH DIFF[[id number]]                Abnormal            Final result               MANUAL DIFF AND MORPHOLO.Marland KitchenMarland Kitchen[[id number]]  Abnormal            Final result                 Please view results for these tests on the individual orders.   BASIC METABOLIC PANEL   Result Value Ref Range    SODIUM 135 (L) 136 - 145 mmol/L    POTASSIUM 3.4 (L) 3.5 - 5.1 mmol/L    CHLORIDE 109 96 - 111 mmol/L    CO2 TOTAL 23 23 - 31 mmol/L    ANION GAP 3 (L) 4 - 13 mmol/L    CALCIUM 6.4 (LL) 8.8 - 10.2 mg/dL    GLUCOSE 120 65 - 125 mg/dL    BUN 9 8 - 25 mg/dL    CREATININE 0.52 (L) 0.75 - 1.35 mg/dL    BUN/CREA RATIO 17 6 - 22    ESTIMATED GFR >90 >=60 mL/min/BSA   PHOSPHORUS   Result Value Ref Range    PHOSPHORUS 1.3 (L) 2.3 - 4.0 mg/dL   MAGNESIUM   Result Value Ref  Range    MAGNESIUM 1.6 (L) 1.8 - 2.6 mg/dL   PTT (PARTIAL THROMBOPLASTIN TIME) - ONCE   Result Value Ref Range    APTT 44.3 (H) 24.2 - 37.5 seconds    Narrative    Therapeutic range for unfractionated heparin is 60-100 seconds.   CBC WITH DIFF   Result Value Ref Range    WBC 25.3 (H) 3.7 - 11.0 x10^3/uL    RBC 2.49 (L) 4.50 - 6.10 x10^6/uL    HGB 7.3 (L) 13.4 - 17.5 g/dL    HCT 22.7 (L) 38.9 - 52.0 %    MCV 91.2 78.0 - 100.0 fL    MCH 29.3 26.0 - 32.0 pg    MCHC 32.2 31.0 - 35.5 g/dL    RDW-CV 16.0 (H) 11.5 - 15.5 %    PLATELETS 412 (H) 150 - 400 x10^3/uL    MPV 11.3 8.7 - 12.5 fL   MANUAL DIFF AND MORPHOLOGY-SYSMEX   Result Value Ref Range    NEUTROPHIL % 89 %    LYMPHOCYTE %  2 %    MONOCYTE % 7 %    EOSINOPHIL % 0 %    BASOPHIL % 0 %    MYELOCYTE % 3 %    NEUTROPHIL # 22.52 (H)  1.50 - 7.70 x10^3/uL    LYMPHOCYTE # 0.51 (L) 1.00 - 4.80 x10^3/uL    MONOCYTE # 1.77 (H) 0.20 - 1.10 x10^3/uL    EOSINOPHIL # <0.10 <=0.50 x10^3/uL    BASOPHIL # <0.10 <=0.20 x10^3/uL    NRBC FROM MANUAL DIFF 15 per 100 WBC    RBC MORPHOLOGY Normal RBC and PLT Morphology    POC BLOOD GLUCOSE (RESULTS)   Result Value Ref Range    GLUCOSE, POC 230 (H) 70 - 105 mg/dl   POC BLOOD GLUCOSE (RESULTS)   Result Value Ref Range    GLUCOSE, POC 175 (H) 70 - 105 mg/dl   POC BLOOD GLUCOSE (RESULTS)   Result Value Ref Range    GLUCOSE, POC 121 (H) 70 - 105 mg/dl       MICROBIOLOGY:   Blood cultures 04/08/2021:  Positive for Pseudomonas aeruginosa in 1/2 bottles  MRSA nasal PCR screen 04/08/2021:  Positive  Blood cultures 04/09/2021:  No growth x 2 days  Right upper quadrant fluid culture 04/09/2021:  Positive for Pseudomonas aeruginosa and Enterococcus faecium  HIV RNA quantitative PCR 04/10/2021:  Pending    IMAGING STUDIES:  No new imaging studies for review    ASSESSMENT:  Mr. Robar is a very pleasant 76 y.o. gentleman with a past medical history significant for HIV infection, stable on Genvoya, hypertension, hyperlipidemia. DVT/PE, MSSA right native hip septic arthritis, perforated diverticulitis s/p colectomy, hypothyroidism, and intraductal papillary mucinous neoplasm (IPMN) who was admitted on 04/03/21 with intra-abdominal hemorrhage.  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  His post-operative course was complicated by development of a PE for which he was started on Eliquis.  He presented to [facility] on 04/[facility]/21 due to increased bleeding around his JP drain at which time a CTA showed extravasation around the pancreatic resection site.  At that facility he underwent exploratory laparotomy with ligation of bleeding vessels prior to transfer here for further management.  Following admission  here, he developed worsening leukocytosis.  Blood cultures drawn on 04/08/21 are now positive for Pseudomonas aeruginosa in 1/2 sets drawn from the midline.  CT  scan of the chest, abdomen, and pelvis obtained on 04/08/21 showed extensive multiloculated RUQ fluid collection and additional complex fluid collections in the pancreatectomy bed and splenectomy bed, but no active bleeding.  On 04/09/21, he underwent IR-guided RUQ peritoneal drain placement and empiric embolization of the splenic artery stump and left inferior adrenal artery.  The abdominal fluid was bloody in appearance with cultures now positive for Pseudomonas aeruginosa and Enterococcus faecium.  He is currently on antimicrobial therapy with vancomycin, cefepime, and metronidazole, which he is tolerating well.        RECOMMENDATIONS:    Pseudomonas Bacteremia with Concern for Infected Intra-abdominal Hematoma  1. Continue to follow final results of blood cultures from 04/08/21  2. Continue to follow pending blood cultures from 04/09/21, which are no growth to date  3. Continue antimicrobial therapy with cefepime dosed 2g IV every 8 hours given isolation of Pseudomonas  4. Continue to follow culture results of abdominal fluid, which are now positive for Pseudomonas aeruginosa and E. Faecium.  5. Continue antimicrobial therapy with vancomycin with dose per pharmacy protocol  6. Continue antimicrobial therapy with metronidazole dosed 500 mg BID  7. Please continue to monitor CBC with differential, BUN, creatinine, hepatic function panel, and CRP while the patient is on antimicrobial therapy.    HIV Infection  1. CD4 count obtained on 04/03/21 was low at 117 with preserved percentage and CD4:CD8 ratio.  2. Recommend obtaining repeat CD4/CD8 count  3. Please continue antiretroviral therapy with Genvoya.  Please check for any drug interactions with this agent prior to initiation of new medications  4. Continue to follow pending HIV PCR    Thank you for this  consultation.  We will continue to follow    On the day of the encounter, a total of (30) minutes was spent in direct/indirect care of this patient including evaluation, review and interpretation of laboratory and microbiology studies, review of radiology studies and the medical record, order entry and coordination of care. counseling and educating the patient/family/caregiver, and documentation.    MDM Level    1. Problems addressed: High (>or= chronic illness w severe exacerbation, progression, or treatment side-effects; 1 acute or chronic illness or injury that poses a threat to life or bodily function): Pseudomonas bacteremia, HIV infection, Intra-abdominal infected hematoma  2. Data reviewed & analyzed: High: 2 of 3 following: review of external notes, review of tests results, ordering of tests, assessment requiring an independent historian; independent interpretation of a test performed by another provider not separately reported; discussion of management or test interpretation w external provider: I have reviewed all laboratory studies, microbiology data, and discussed the plan of care with the SICU team  3. Risk of morbidity: High (ex: drug therapy requiring intensive monitoring, decision regarding major surgery with risk factors, decision regarding hospitalization or escalation of hospital level of care): The patient is receiving IV antimicrobial therapy, which requires intensive monitoring      Valorie Roosevelt, MD, PhD  Associate Professor  Department of Internal Medicine  Section of Infectious Diseases  [facility]

## 2021-04-11 NOTE — Progress Notes (Signed)
[facility]  Surgical Oncology  Progress Note      Britton, Bera, 76 y.o. male  Date of Birth:  1945-07-07  Date of Admission:  04/03/2021  Date of service: 04/11/2021    Assessment:  This is a 76 y.o. 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 He underwent massive transfusion protocol and ex lap on 04/03/21 at an outside facility with ligation of a bleeding vessel in the liver bed. Patient was subsequently transferred to [facility] postoperaively, intubated. Patient was subsequently extubated later that day on 3/4. Patient remains tachycardic today without further transfusion requirements.       Plan/Recommendations:  - Maintain SICU status  - Diet: NPO, okay with sips and chips  - Aspiration precautions - HOB 30  - Patient underwent drain placement & emobolization with IR on 3/10         - JP continues to drain pancreatic fluid               -- Amylase level > 6,554 04/06/21 & 04/08/21 and 04/10/21, repeat amylase tomorrow   -Leukocytosis   -CT C/A/P                  -- Peripancreatic fluid collections                  -- IR consulted; drain placement 3/10- now with bloody output again- hold hep gtt              - Blood cultures 3/9: + pseudomonas               - Repeat Blood cultures today   - Pull midline   - Continue Vanc, Cefepime, flagyl               - ID consulted   - PE   - Hold low intensity, no bolus heparin drip              - Trend H&H    - Encouraged IS and ambulation   - Aggressive plum toilet   - Wean O2   - C/f for midline fistula        - increased brown drainage from midline incision        - ostomy bag added to monitor output       - Continue to monitor     - OOB TID  - PT/OT ordered    Subjective: Complains of some epigastric tenderness, but abdominal tenderness on physical exam is improved. Feculent drainage  from midline incision this AM. Denies any nausea/vomiting.     Objective  Filed Vitals:    04/11/21 0600 04/11/21 0700 04/11/21 0800 04/11/21 0823   BP: 135/84      Pulse: 97 95     Resp: (!) 30 (!) 10  20   Temp:   36.6 C (97.9 F)    SpO2: 94% 95%       Physical Exam:   GEN:  AOx4, resting in bed, no acute distress  PULM: Normal respiratory effort.   CV:  Tachycardic, regular rhythm   ABD:   Abdomen soft, distended, TTP RUQ. JP in place in RUQ with dark pancreatic fluid,  Lower midline incision with brown drainage present. Pigtail catheter present with bloody output.   MS: Atraumatic.  Distal pulses intact.  Normal strength and range of motion of all extremities.    NEURO:   Alert and oriented to person, place and time.  Cranial nerves grossly intact.    Vascular:  All pulses palpable and equal bilaterally  Integumentary:  Pink, warm, and dry  PSYCHOSOCIAL: Pleasant.  Normal affect.     Labs  Results for orders placed or performed during the hospital encounter of 04/03/21 (from the past 24 hour(s))   VANCOMYCIN, TROUGH   Result Value Ref Range    VANCOMYCIN TROUGH 12.0 10.0 - 20.0 ug/mL   H & H   Result Value Ref Range    HGB 8.2 (L) 13.4 - 17.5 g/dL    HCT 26.0 (L) 38.9 - 52.0 %   AMYLASE BODY FLUID   Result Value Ref Range    AMYLASE BODY FLUID >6,554 No reference intervals are established. U/L   AMYLASE BODY FLUID   Result Value Ref Range    AMYLASE BODY FLUID >6,554 No reference intervals are established. U/L   PTT (PARTIAL THROMBOPLASTIN TIME)   Result Value Ref Range    APTT 31.6 24.2 - 37.5 seconds    Narrative    Therapeutic range for unfractionated heparin is 60-100 seconds.   H & H   Result Value Ref Range    HGB 9.0 (L) 13.4 - 17.5 g/dL    HCT 27.5 (L) 38.9 - 52.0 %   PTT (PARTIAL THROMBOPLASTIN TIME)   Result Value Ref Range    APTT 33.0 24.2 - 37.5 seconds    Narrative    Therapeutic range for unfractionated heparin is 60-100 seconds.   CBC/DIFF    Narrative    The following orders were created for  panel order CBC/DIFF.  Procedure                               Abnormality         Status                     ---------                               -----------         ------                     CBC WITH DIFF[[id number]]                Abnormal            Final result               MANUAL DIFF AND MORPHOLO.Marland KitchenMarland Kitchen[[id number]]  Abnormal            Final result                 Please view results for these tests on the individual orders.   BASIC METABOLIC PANEL   Result Value Ref Range    SODIUM 135 (L) 136 - 145 mmol/L    POTASSIUM 3.4 (L) 3.5 - 5.1 mmol/L    CHLORIDE 109 96 - 111 mmol/L    CO2 TOTAL 23 23 - 31 mmol/L    ANION GAP 3 (L) 4 - 13 mmol/L    CALCIUM 6.4 (LL) 8.8 - 10.2 mg/dL    GLUCOSE 120 65 - 125 mg/dL    BUN 9  8 - 25 mg/dL    CREATININE 0.52 (L) 0.75 - 1.35 mg/dL    BUN/CREA RATIO 17 6 - 22    ESTIMATED GFR >90 >=60 mL/min/BSA   PHOSPHORUS   Result Value Ref Range    PHOSPHORUS 1.3 (L) 2.3 - 4.0 mg/dL   MAGNESIUM   Result Value Ref Range    MAGNESIUM 1.6 (L) 1.8 - 2.6 mg/dL   PTT (PARTIAL THROMBOPLASTIN TIME) - ONCE   Result Value Ref Range    APTT 44.3 (H) 24.2 - 37.5 seconds    Narrative    Therapeutic range for unfractionated heparin is 60-100 seconds.   CBC WITH DIFF   Result Value Ref Range    WBC 25.3 (H) 3.7 - 11.0 x10^3/uL    RBC 2.49 (L) 4.50 - 6.10 x10^6/uL    HGB 7.3 (L) 13.4 - 17.5 g/dL    HCT 22.7 (L) 38.9 - 52.0 %    MCV 91.2 78.0 - 100.0 fL    MCH 29.3 26.0 - 32.0 pg    MCHC 32.2 31.0 - 35.5 g/dL    RDW-CV 16.0 (H) 11.5 - 15.5 %    PLATELETS 412 (H) 150 - 400 x10^3/uL    MPV 11.3 8.7 - 12.5 fL   MANUAL DIFF AND MORPHOLOGY-SYSMEX   Result Value Ref Range    NEUTROPHIL % 89 %    LYMPHOCYTE %  2 %    MONOCYTE % 7 %    EOSINOPHIL % 0 %    BASOPHIL % 0 %    MYELOCYTE % 3 %    NEUTROPHIL # 22.52 (H) 1.50 - 7.70 x10^3/uL    LYMPHOCYTE # 0.51 (L) 1.00 - 4.80 x10^3/uL    MONOCYTE # 1.77 (H) 0.20 - 1.10 x10^3/uL    EOSINOPHIL # <0.10 <=0.50 x10^3/uL    BASOPHIL # <0.10 <=0.20 x10^3/uL    NRBC FROM  MANUAL DIFF 15 per 100 WBC    RBC MORPHOLOGY Normal RBC and PLT Morphology    H & H   Result Value Ref Range    HGB 8.9 (L) 13.4 - 17.5 g/dL    HCT 27.1 (L) 38.9 - 52.0 %   POC BLOOD GLUCOSE (RESULTS)   Result Value Ref Range    GLUCOSE, POC 230 (H) 70 - 105 mg/dl   POC BLOOD GLUCOSE (RESULTS)   Result Value Ref Range    GLUCOSE, POC 175 (H) 70 - 105 mg/dl   POC BLOOD GLUCOSE (RESULTS)   Result Value Ref Range    GLUCOSE, POC 121 (H) 70 - 105 mg/dl       I/O:  Date 04/10/21 0700 - 04/11/21 0659 04/11/21 0700 - 04/12/21 0659   Shift 0700-1459 1500-2259 2300-0659 24 Hour Total 0700-1459 1500-2259 2300-0659 24 Hour Total   INTAKE   I.V.(mL/kg/hr) 185.63(1.49) 665.72(0.96) 611.39(0.87) 2053.66(0.97) 87.4   87.4     Heparin Volume 591.55 65.72 86.39 743.66 12.4   12.4     Volume Infused (electrolyte-A (PLASMALYTE-A) premix infusion) 75   75         Volume Infused (dextrose 5 % in electrolyte-A (PLASMALYTE-A) 1,000 mL infusion) [phone] 75   75   IV Piggyback 112 100  212         Volume (sodium phosphate 30 mmol in D5W 100 mL IVPB) 112   112         Volume (cefepime (MAXIPIME) 2 g in NS 100 mL IVPB minibag)  100  100  Shift Total(mL/kg) [id number]) 315.40(0.86) 761.95(0.93) 2671.24(58.09) 87.4(0.99)   87.4(0.99)   OUTPUT   Urine(mL/kg/hr) 600(0.86) 250(0.36) 525(0.74) 1375(0.65)         Urine (Voided) 600 [phone]         Urine Occurrence 3 x 1 x  4 x       Drains 415 225 350 990         JP Drain Output ([facility] Drain Right;Upper Abdomen) 55 5 190 250         Drain Output (Drain (Miscellaneous) #32F pigtail catheter for RLQ fluid collection Lower;Right;Anterior Abdomen) 360 220 160 740       Stool             Stool Occurrence  1 x  1 x       Shift Total(mL/kg) 9833(82.50) 475(5.47) 875(9.92) 5397(67.34)       Weight (kg) 86.9 86.9 88.2 88.2 88.2 88.2 88.2 88.2       Radiology  Results for orders placed or performed during the hospital encounter of 04/03/21 (from the past 72 hour(s))   CT CHEST  ABDOMEN PELVIS W IV CONTRAST     Status: None    Narrative    HUSAYN REIM [place]  Male, 76 years old.    CT CHEST ABDOMEN PELVIS W IV CONTRAST performed on 04/08/2021 1:22 PM.    REASON FOR EXAM:  Elevated WBC    TECHNIQUE: Axial CT images were obtained through the chest, abdomen, and pelvis following the intravenous administration of contrast. Coronal and sagittal reformatted images were created.    RADIATION DOSE: 1343.80 mGycm  CONTRAST: 83 mL of Isovue 370    COMPARISON: CT of the chest, abdomen, and pelvis performed March 31, 2021.    FINDINGS:     CHEST:    LUNGS: Bibasilar atelectasis is seen in association with bilateral pleural effusions, right greater than left. The large central airways are patent.    HEART/MEDIASTINUM: The heart is within normal limits in size. The thoracic aorta is within normal limits in caliber and contour. There is redemonstration of subocclusive filling defects involving the right main pulmonary artery as well as several segmental right-sided branches, similar to March 31, 2021.    PLEURA: Small bilateral, partially loculated pleural effusions are present, right greater than left.    LYMPH NODES: No abnormally enlarged lymph nodes are detected in the chest.    BONES/SOFT TISSUES: Diminished bone mineral density is noted. No acute bony abnormalities are detected within the chest.      ABDOMEN/PELVIS:     LIVER: The liver is within normal limits in size. There are extensive smooth lobular contour changes of the liver due to a large amount of complex perihepatic fluid which demonstrates areas of increased density as well as apparent septations. Some of this complex fluid extends inferiorly into the right abdomen and along the gallbladder fossa. This large collection measures up to at least 17 cm in its greatest craniocaudal extent.     BILIARY SYSTEM/GALLBLADDER: The gallbladder is normal in size. No intra or extrahepatic bile duct dilation is present.    PANCREAS: The patient is status  post recent distal pancreatectomy. There is a moderate amount of complex fluid seen along the suture line at the distal pancreatectomy bed, series 15 image 43, measuring 7.3 x 5.8 cm. This fluid is likely contiguous with the low-density fluid in the splenectomy bed.    KIDNEY/URETERS: The kidneys are normal in size with symmetric parenchymal enhancement. No  hydronephrosis or solid renal mass is present. There are small low-density renal masses bilaterally which are too small to definitively characterize but most likely represent small cysts.    ADRENALS: Within normal limits.    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 There is some loculated fluid in the left upper quadrant with an indwelling surgical drain in this region.    BOWEL: The stomach is incompletely distended. There is extrinsic compression on the distal stomach and proximal duodenum due to the complex fluid collections in the upper abdomen described above. No abnormally dilated loops of bowel are distinct transition points are detected to suggest bowel obstruction.    VASCULATURE/LYMPH NODES: The abdominal aorta is normal in caliber and contour. The portal and superior mesenteric veins are patent.    BLADDER: The bladder is mildly distended. No bladder wall thickening is visible.    REPRODUCTIVE ORGANS: The prostate appears normal in size.    PERITONEAL CAVITY: As noted above, there is complex multiloculated fluid with increased density seen predominantly in the right upper quadrant. Additional lower density ill-defined fluid is noted in the lesser sac and left upper quadrant. These collections are as described above.    BONES/SOFT TISSUES: Diffuse body wall edema is present. 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 There are mild multilevel degenerative changes of the lumbar spine. No acute or destructive bony abnormality is seen.      Impression    1. Extensive multiloculated, increased density  perihepatic and right upper quadrant fluid collection with an imaging appearance most concerning for evolving hematoma/hemoperitoneum given the setting of recent hemorrhage. No internal air is visible within this collection to specifically suggest superimposed infection.    2. Additional complex fluid collections centered at the recent distal pancreatectomy bed and within the splenectomy bed could also represent evolving postoperative fluid, although, raises concern for a pancreatic leak in the appropriate context.    3. Redemonstration of a subocclusive, subsegmental right-sided pulmonary emboli, similar to March 31, 2021.    4. Small, partially loculated bilateral pleural effusions with bibasilar atelectasis.     CT ANGIO ABDOMEN PELVIS W/WO IV CONTRAST     Status: None (Preliminary result)    Narrative    CT ANGIO ABDOMEN PELVIS W/WO IV CONTRAST performed on 04/09/2021 11:49 AM    INDICATION: 76 years old Male  Abdominal bleeding    TECHNIQUE: Axial images from lung bases through upper thighs followed by intravenous administration of nonionic contrast, with additional multiplanar images in early arterial and venous phases. Arterial coronal and sagittal reformats utilize MIP reconstruction techniques    CONTRAST: 100 cc of Isovue 370    RADIATION DOSE: 2987.10 mGycm    COMPARISON: CT chest abdomen pelvis 04/08/2021.    FINDINGS:  VASCULAR: The abdominal aorta is of normal caliber.  The celiac, superior mesenteric, and inferior mesenteric arteries are patent.  The bilateral renal arteries are patent.  The iliofemoral vessels are normal caliber.  Mild atherosclerotic disease is present. Note is made of a pulmonary embolism involving the right upper and lower lobes. This was present on CT chest abdomen pelvis 03/31/2020. The portal vein and IVC are patent.    NONVASCULAR:  Lung Bases: Greater than right pleural effusions with associated atelectasis are identified.    Liver: Diffuse fatty change of the liver is present.    There is redemonstration of a loculated, complex, perihepatic collection resulting in mass effect on the hepatic parenchyma. Overall this is similar  in appearance relative to CT 04/08/2021.    Gallbladder/Biliary System: No hyperattenuating foci are present within the gallbladder lumen.  No bile duct dilation is seen.    Spleen: The spleen is surgically absent. Nonloculated fluid as well as surgical train are present within the left upper quadrant.    Pancreas: There are postoperative changes from distal pancreatectomy with postoperative fluid similar in appearance relative to comparison study.    Adrenals: The adrenal glands are grossly unremarkable.    Kidney/Ureters/Bladder: The kidneys are symmetric in size. Bilateral simple to minimally complex cysts are again noted. There is no evidence of hydronephrosis. Mild bilateral perinephric stranding is unchanged. The ureters are of normal caliber.  The bladder is partially distended limiting evaluation..    Reproductive Organs: The prostate is normal in size.    Bowel: There is narrowing of the pylorus and first portion of the duodenum secondary to extrinsic compression by multiple abdominal fluid collections. This is unchanged. Fluid collection along the greater curvature is redemonstrated and unchanged. Enteric contrast is noted throughout the large bowel and present to the level the rectum. No abnormally dilated loops of bowel are identified to suggest obstruction.    Lymph Nodes: No suspicious lymph nodes are seen throughout the abdomen and pelvis.    Peritoneal Cavity: Multiple complex fluid collections within the right upper quadrant as well as fluid throughout the mid abdomen and left upper quadrant are present and described above. A small amount of residual pneumoperitoneum is noted and decreased relative to comparison study. Postoperative changes along the abdominal midline wall are present.    Soft Tissues: Anasarca.    Bones: Vertebral body height and  alignment is maintained. Bilateral femoral head avascular necrosis is noted.        Impression    1.No acute bleed.  2.Stable multiloculated right perihepatic collection and fluid collections within the mid abdomen and left upper quadrant.  3.Partially imaged subocclusive, segmental, right pulmonary emboli previously demonstrated on CT 03/31/2021.  4.Right greater than left pleural effusion and atelectasis.         Current Medications:  Current Facility-Administered Medications   Medication Dose Route Frequency    acetaminophen (TYLENOL) tablet  650 mg Oral Q4H    atorvastatin (LIPITOR) tablet  40 mg Oral Daily with Breakfast    benzonatate (TESSALON) capsule  100 mg Oral Q8H PRN    cefepime (MAXIPIME) 2 g in NS 100 mL IVPB minibag  2 g Intravenous Q8H    D5W 250 mL flush bag   Intravenous Q15 Min PRN    dextrose 5 % in electrolyte-A (PLASMALYTE-A) 1,000 mL infusion   Intravenous Continuous    diatrizoate meglumine & sodium oral solution  8 mL Oral Give in Radiology    docusate sodium (COLACE) capsule  100 mg Oral 2x/day    elvitegravir-cobicistat-emtricitrabine-tenofovir (GENVOYA) 150-150-200-10 mg per tablet  1 Tablet Oral Daily    gabapentin (NEURONTIN) capsule  300 mg Oral 3x/day    heparin 25,000 units in NS 250 mL infusion  12 Units/kg/hr (Adjusted) Intravenous Continuous    HYDROmorphone (DILAUDID) 1 mg/mL injection  0.2 mg Intravenous Q3H PRN    iopamidol (ISOVUE-370) 76% infusion  100 mL Intravenous Give in Radiology    levothyroxine (SYNTHROID) tablet  88 mcg Oral QAM    magnesium sulfate 4 G in SW 100 mL premix IVPB  4 g Intravenous Once    methocarbamol (ROBAXIN) tablet  500 mg Oral 4x/day    metroNIDAZOLE (FLAGYL) tablet  500 mg  Oral 2x/day    NS 250 mL flush bag   Intravenous Q15 Min PRN    NS flush syringe  2-6 mL Intracatheter Q8HRS    NS flush syringe  2-6 mL Intracatheter Q1 MIN PRN    NS flush syringe  10-30 mL Intracatheter Q8HRS    NS flush syringe  20-30 mL Intracatheter Q1 MIN PRN    ondansetron  (ZOFRAN) 2 mg/mL injection  4 mg Intravenous Q6H PRN    oxyCODONE (ROXICODONE) immediate release tablet  5 mg Oral Q4H PRN    oxyCODONE (ROXICODONE) immediate releaste tablet  10 mg Oral Q4H PRN    pantoprazole (PROTONIX) delayed release tablet  40 mg Oral Daily before Breakfast    potassium phosphate 30 mmol in NS 500 mL IVPB  30 mmol Intravenous Once    scopolamine 1 mg over 3 days transdermal patch  1 Patch Transdermal Q72H    sennosides-docusate sodium (SENOKOT-S) 8.6-'50mg'$  per tablet  1 Tablet Oral 2x/day    SSIP insulin R human (HUMULIN R) 100 units/mL injection  0-12 Units Subcutaneous Q6H PRN    vancomycin (VANCOCIN) 1 g in D5W 200 mL premix IVPB  15 mg/kg (Adjusted) Intravenous Q12H    Vancomycin IV - Pharmacist to Dose per Protocol   Does not apply Daily PRN         [name], MD, 10:04  04/11/2021  PGY-2 General Surgery    I saw and examined this patient.  Please see the resident's note, which I have carefully reviewed, for full details. I agree with the findings and plan of care as documented in the note.  Any additions/exceptions are edited/noted.    Continuing to [facility] for signs of intraperitoneal contamination.   Treating pseudomonas bacteremia- will need PICC TPN.   Hold hep gtt for downtrending Hgb and blood in bag. Unknown source.     Lorriane Shire, MD 04/11/2021 10:21  Assistant Professor of Surgery  Division of [place] [facility]

## 2021-04-12 DIAGNOSIS — B952 Enterococcus as the cause of diseases classified elsewhere: Secondary | ICD-10-CM

## 2021-04-12 LAB — H & H
HCT: 25.4 % — ABNORMAL LOW (ref 38.9–52.0)
HCT: 25.2 % — ABNORMAL LOW (ref 38.9–52.0)
HCT: 26.3 % — ABNORMAL LOW (ref 38.9–52.0)
HGB: 8.1 g/dL — ABNORMAL LOW (ref 13.4–17.5)
HGB: 8.2 g/dL — ABNORMAL LOW (ref 13.4–17.5)
HGB: 8.4 g/dL — ABNORMAL LOW (ref 13.4–17.5)

## 2021-04-12 LAB — CD4/CD8
CD3 # (T CELL): 639 cells/uL — ABNORMAL LOW (ref 892–2436)
CD3 % (T CELL): 68 %
CD4 # (HELPER T CELL): 273 {cells}/uL — ABNORMAL LOW (ref 382–1614)
CD4 % (HELPER T CELL): 29 %
CD4:CD8 RATIO: 0.7 — ABNORMAL LOW (ref 1.0–3.4)
CD8 # (SUPPRESSOR T CELL): 370 cells/uL (ref 157–813)
CD8 % (SUPPRESSOR T CELL): 40 %

## 2021-04-12 LAB — BASIC METABOLIC PANEL
ANION GAP: 5 mmol/L (ref 4–13)
BUN/CREA RATIO: 19 (ref 6–22)
BUN: 13 mg/dL (ref 8–25)
CALCIUM: 7.8 mg/dL — ABNORMAL LOW (ref 8.8–10.2)
CHLORIDE: 101 mmol/L (ref 96–111)
CO2 TOTAL: 25 mmol/L (ref 23–31)
CREATININE: 0.67 mg/dL — ABNORMAL LOW (ref 0.75–1.35)
ESTIMATED GFR: >90 mL/min/BSA (ref >=60)
GLUCOSE: 140 mg/dL — ABNORMAL HIGH (ref 65–125)
POTASSIUM: 4.3 mmol/L (ref 3.5–5.1)
SODIUM: 131 mmol/L — ABNORMAL LOW (ref 136–145)

## 2021-04-12 LAB — POC BLOOD GLUCOSE (RESULTS)
GLUCOSE, POC: 128 mg/dl — ABNORMAL HIGH (ref 70–105)
GLUCOSE, POC: 138 mg/dl — ABNORMAL HIGH (ref 70–105)
GLUCOSE, POC: 154 mg/dl — ABNORMAL HIGH (ref 70–105)
GLUCOSE, POC: 230 mg/dl — ABNORMAL HIGH (ref 70–105)

## 2021-04-12 LAB — CBC
HCT: 25.8 % — ABNORMAL LOW (ref 38.9–52.0)
HGB: 8.1 g/dL — ABNORMAL LOW (ref 13.4–17.5)
MCH: 28.7 pg (ref 26.0–32.0)
MCHC: 31.4 g/dL (ref 31.0–35.5)
MCV: 91.5 fL (ref 78.0–100.0)
MPV: 11.9 fL (ref 8.7–12.5)
PLATELETS: 564 10*3/uL — ABNORMAL HIGH (ref 150–400)
RBC: 2.82 10*6/uL — ABNORMAL LOW (ref 4.50–6.10)
RDW-CV: 15.9 % — ABNORMAL HIGH (ref 11.5–15.5)
WBC: 33.5 10*3/uL — ABNORMAL HIGH (ref 3.7–11.0)

## 2021-04-12 LAB — HEPATIC FUNCTION PANEL
ALBUMIN: 1.4 g/dL — ABNORMAL LOW (ref 3.4–4.8)
ALKALINE PHOSPHATASE: 158 U/L — ABNORMAL HIGH (ref 45–115)
ALT (SGPT): 19 U/L (ref 10–55)
AST (SGOT): 24 U/L (ref 8–45)
BILIRUBIN DIRECT: 0.2 mg/dL (ref 0.1–0.4)
BILIRUBIN TOTAL: 0.3 mg/dL (ref 0.3–1.3)
PROTEIN TOTAL: 4.5 g/dL — ABNORMAL LOW (ref 6.0–8.0)

## 2021-04-12 LAB — PHOSPHORUS: PHOSPHORUS: 2.5 mg/dL (ref 2.3–4.0)

## 2021-04-12 LAB — TRIGLYCERIDE: TRIGLYCERIDES: 91 mg/dL (ref <150)

## 2021-04-12 LAB — PTT (PARTIAL THROMBOPLASTIN TIME)
APTT: 57.9 seconds — ABNORMAL HIGH (ref 24.2–37.5)
APTT: 42.9 seconds — ABNORMAL HIGH (ref 24.2–37.5)
APTT: 73.7 seconds — ABNORMAL HIGH (ref 24.2–37.5)
APTT: 79.4 seconds — ABNORMAL HIGH (ref 24.2–37.5)

## 2021-04-12 LAB — CREATINE KINASE (CK), TOTAL, SERUM: CREATINE KINASE: 36 U/L — ABNORMAL LOW (ref 45–225)

## 2021-04-12 LAB — MAGNESIUM: MAGNESIUM: 2.2 mg/dL (ref 1.8–2.6)

## 2021-04-12 MED ORDER — LIDOCAINE (PF) 10 MG/ML (1 %) INJECTION SOLUTION
0.5000 mL | Freq: Once | INTRAMUSCULAR | Status: AC
Start: 2021-04-12 — End: 2021-04-12
  Administered 2021-04-12: 0.5 mL via INTRADERMAL

## 2021-04-12 MED ORDER — PARENTERAL AMINO ACID 10 % COMBINATION NO.6 INTRAVENOUS SOLUTION
INTRAVENOUS | Status: AC
Start: 2021-04-12 — End: 2021-04-13
  Filled 2021-04-12: qty 500

## 2021-04-12 MED ORDER — SODIUM CHLORIDE 0.9 % (FLUSH) INJECTION SYRINGE
10.0000 mL | INJECTION | Freq: Three times a day (TID) | INTRAMUSCULAR | Status: DC
Start: 2021-04-12 — End: 2021-05-03
  Administered 2021-04-12: 0 mL
  Administered 2021-04-12 – 2021-04-13 (×3): 10 mL
  Administered 2021-04-13: 30 mL
  Administered 2021-04-14: 10 mL
  Administered 2021-04-14: 0 mL
  Administered 2021-04-14: 10 mL
  Administered 2021-04-15 (×2): 0 mL
  Administered 2021-04-15 – 2021-04-16 (×2): 10 mL
  Administered 2021-04-16: 30 mL
  Administered 2021-04-16 – 2021-04-17 (×2): 0 mL
  Administered 2021-04-17: 30 mL
  Administered 2021-04-18: 10 mL
  Administered 2021-04-18 – 2021-04-19 (×2): 0 mL
  Administered 2021-04-19: 10 mL
  Administered 2021-04-19: 30 mL
  Administered 2021-04-20 (×2): 0 mL
  Administered 2021-04-21: 10 mL
  Administered 2021-04-21 – 2021-04-22 (×2): 0 mL
  Administered 2021-04-22: 30 mL
  Administered 2021-04-22 – 2021-04-23 (×2): 0 mL
  Administered 2021-04-23: 30 mL
  Administered 2021-04-23: 20 mL
  Administered 2021-04-24: 0 mL
  Administered 2021-04-24: 10 mL
  Administered 2021-04-24: 30 mL
  Administered 2021-04-25: 10 mL
  Administered 2021-04-25: 0 mL
  Administered 2021-04-25: 20 mL
  Administered 2021-04-26 – 2021-04-27 (×4): 0 mL
  Administered 2021-04-27: 10 mL
  Administered 2021-04-27: 0 mL
  Administered 2021-04-28: 10 mL
  Administered 2021-04-28: 0 mL
  Administered 2021-04-28: 10 mL
  Administered 2021-04-29 (×2): 0 mL
  Administered 2021-04-29 – 2021-04-30 (×4): 10 mL
  Administered 2021-05-01 (×2): 0 mL
  Administered 2021-05-01 – 2021-05-02 (×2): 10 mL
  Administered 2021-05-02: 0 mL
  Administered 2021-05-02 – 2021-05-03 (×2): 10 mL

## 2021-04-12 MED ORDER — SODIUM CHLORIDE 0.9 % (FLUSH) INJECTION SYRINGE
20.0000 mL | INJECTION | INTRAMUSCULAR | Status: DC | PRN
Start: 2021-04-12 — End: 2021-05-03

## 2021-04-12 MED ORDER — ELECTROLYTE-A INTRAVENOUS SOLUTION
INTRAVENOUS | Status: DC
Start: 2021-04-12 — End: 2021-04-16
  Administered 2021-04-15: 1000 mL via INTRAVENOUS
  Administered 2021-04-16: 0 mL via INTRAVENOUS

## 2021-04-12 MED ORDER — SODIUM CHLORIDE 0.9 % INTRAVENOUS SOLUTION
1.0000 g | Freq: Three times a day (TID) | INTRAVENOUS | Status: AC
Start: 2021-04-12 — End: 2021-05-09
  Administered 2021-04-12: 18:00:00 0 g via INTRAVENOUS
  Administered 2021-04-12 – 2021-04-13 (×2): 1 g via INTRAVENOUS
  Administered 2021-04-13: 0 g via INTRAVENOUS
  Administered 2021-04-13: 1 g via INTRAVENOUS
  Administered 2021-04-13: 10:00:00 0 g via INTRAVENOUS
  Administered 2021-04-13: 1 g via INTRAVENOUS
  Administered 2021-04-13: 0 g via INTRAVENOUS
  Administered 2021-04-14: 1 g via INTRAVENOUS
  Administered 2021-04-14 (×2): 0 g via INTRAVENOUS
  Administered 2021-04-14 (×2): 1 g via INTRAVENOUS
  Administered 2021-04-14 – 2021-04-15 (×3): 0 g via INTRAVENOUS
  Administered 2021-04-15: 1 g via INTRAVENOUS
  Administered 2021-04-15: 0 g via INTRAVENOUS
  Administered 2021-04-15 (×2): 1 g via INTRAVENOUS
  Administered 2021-04-16: 0 g via INTRAVENOUS
  Administered 2021-04-16 (×2): 1 g via INTRAVENOUS
  Administered 2021-04-16 (×2): 0 g via INTRAVENOUS
  Administered 2021-04-16 – 2021-04-17 (×4): 1 g via INTRAVENOUS
  Administered 2021-04-17 (×3): 0 g via INTRAVENOUS
  Administered 2021-04-18: 1 g via INTRAVENOUS
  Administered 2021-04-18: 02:00:00 0 g via INTRAVENOUS
  Administered 2021-04-18: 1 g via INTRAVENOUS
  Administered 2021-04-18 (×2): 0 g via INTRAVENOUS
  Administered 2021-04-18: 1 g via INTRAVENOUS
  Administered 2021-04-19 (×3): 0 g via INTRAVENOUS
  Administered 2021-04-19 – 2021-04-20 (×5): 1 g via INTRAVENOUS
  Administered 2021-04-20: 12:00:00 0 g via INTRAVENOUS
  Administered 2021-04-20: 1 g via INTRAVENOUS
  Administered 2021-04-20 – 2021-04-21 (×3): 0 g via INTRAVENOUS
  Administered 2021-04-21 (×2): 1 g via INTRAVENOUS
  Administered 2021-04-21 (×2): 0 g via INTRAVENOUS
  Administered 2021-04-21: 10:00:00 1 g via INTRAVENOUS
  Administered 2021-04-22: 04:00:00 0 g via INTRAVENOUS
  Administered 2021-04-22: 1 g via INTRAVENOUS
  Administered 2021-04-22 (×2): 0 g via INTRAVENOUS
  Administered 2021-04-22 – 2021-04-23 (×4): 1 g via INTRAVENOUS
  Administered 2021-04-23: 11:00:00 0 g via INTRAVENOUS
  Administered 2021-04-23: 18:00:00 1 g via INTRAVENOUS
  Administered 2021-04-23 (×2): 0 g via INTRAVENOUS
  Administered 2021-04-24 (×2): 1 g via INTRAVENOUS
  Administered 2021-04-24 (×2): 0 g via INTRAVENOUS
  Administered 2021-04-24: 03:00:00 1 g via INTRAVENOUS
  Administered 2021-04-24 – 2021-04-25 (×2): 0 g via INTRAVENOUS
  Administered 2021-04-25 (×3): 1 g via INTRAVENOUS
  Administered 2021-04-25 – 2021-04-26 (×4): 0 g via INTRAVENOUS
  Administered 2021-04-26: 11:00:00 1 g via INTRAVENOUS
  Administered 2021-04-26: 0 g via INTRAVENOUS
  Administered 2021-04-26 (×2): 1 g via INTRAVENOUS
  Administered 2021-04-27: 0 g via INTRAVENOUS
  Administered 2021-04-27 (×2): 1 g via INTRAVENOUS
  Administered 2021-04-27: 0 g via INTRAVENOUS
  Administered 2021-04-27: 1 g via INTRAVENOUS
  Administered 2021-04-27 – 2021-04-28 (×4): 0 g via INTRAVENOUS
  Administered 2021-04-28 – 2021-04-29 (×4): 1 g via INTRAVENOUS
  Administered 2021-04-29 (×2): 0 g via INTRAVENOUS
  Administered 2021-04-29 (×2): 1 g via INTRAVENOUS
  Administered 2021-04-29 – 2021-04-30 (×4): 0 g via INTRAVENOUS
  Administered 2021-04-30 – 2021-05-01 (×4): 1 g via INTRAVENOUS
  Administered 2021-05-01: 0 g via INTRAVENOUS
  Administered 2021-05-01: 1 g via INTRAVENOUS
  Administered 2021-05-01: 0 g via INTRAVENOUS
  Administered 2021-05-01: 1 g via INTRAVENOUS
  Administered 2021-05-01 – 2021-05-02 (×2): 0 g via INTRAVENOUS
  Administered 2021-05-02: 1 g via INTRAVENOUS
  Administered 2021-05-02: 0 g via INTRAVENOUS
  Administered 2021-05-02: 1 g via INTRAVENOUS
  Administered 2021-05-02: 0 g via INTRAVENOUS
  Administered 2021-05-02: 1 g via INTRAVENOUS
  Administered 2021-05-03 (×2): 0 g via INTRAVENOUS
  Administered 2021-05-03 (×3): 1 g via INTRAVENOUS
  Administered 2021-05-03 – 2021-05-04 (×3): 0 g via INTRAVENOUS
  Administered 2021-05-04: 1 g via INTRAVENOUS
  Administered 2021-05-04: 0 g via INTRAVENOUS
  Administered 2021-05-04 (×2): 1 g via INTRAVENOUS
  Administered 2021-05-05: 0 g via INTRAVENOUS
  Administered 2021-05-05 (×3): 1 g via INTRAVENOUS
  Administered 2021-05-05 – 2021-05-06 (×3): 0 g via INTRAVENOUS
  Administered 2021-05-06: 1 g via INTRAVENOUS
  Administered 2021-05-06: 0 g via INTRAVENOUS
  Administered 2021-05-06 (×2): 1 g via INTRAVENOUS
  Administered 2021-05-06 – 2021-05-07 (×3): 0 g via INTRAVENOUS
  Administered 2021-05-07: 1 g via INTRAVENOUS
  Administered 2021-05-07: 0 g via INTRAVENOUS
  Administered 2021-05-07 – 2021-05-08 (×3): 1 g via INTRAVENOUS
  Administered 2021-05-08: 0 g via INTRAVENOUS
  Administered 2021-05-08 (×2): 1 g via INTRAVENOUS
  Administered 2021-05-08 – 2021-05-09 (×4): 0 g via INTRAVENOUS
  Administered 2021-05-09: 1 g via INTRAVENOUS
  Administered 2021-05-09: 0 g via INTRAVENOUS
  Administered 2021-05-09 (×2): 1 g via INTRAVENOUS
  Filled 2021-04-12 (×82): qty 20

## 2021-04-12 MED ORDER — SODIUM CHLORIDE 0.9 % INTRAVENOUS SOLUTION
10.0000 mg/kg | INTRAVENOUS | Status: DC
Start: 2021-04-12 — End: 2021-04-15
  Administered 2021-04-12: 800 mg via INTRAVENOUS
  Administered 2021-04-12: 0 mg via INTRAVENOUS
  Administered 2021-04-13: 800 mg via INTRAVENOUS
  Administered 2021-04-13 – 2021-04-14 (×2): 0 mg via INTRAVENOUS
  Administered 2021-04-14: 800 mg via INTRAVENOUS
  Filled 2021-04-12 (×4): qty 16

## 2021-04-12 MED ORDER — PANTOPRAZOLE 40 MG INTRAVENOUS SOLUTION
40.0000 mg | Freq: Every day | INTRAVENOUS | Status: DC
Start: 2021-04-13 — End: 2021-04-15
  Administered 2021-04-13 – 2021-04-14 (×2): 40 mg via INTRAVENOUS
  Filled 2021-04-12 (×2): qty 10

## 2021-04-12 NOTE — Discharge Instructions (Signed)
PICC DISCHARGE INSTRUCTIONS      PICC Information:  4 FR Power PICC, Bard SOLO  with 2 lumen(s).  Placed in Right, Basilic Vein with tip location of Cavoatrial Junction.   Total Catheter length is 42cm.  External Catheter length is 0cm.  Placed on 04-12-2021.  Placed by R. Makita Blow Environmental consultant Vascular Access Team:   Office Phone with Voicemail [phone] ext: [phone]  Pager [phone], pager [phone]     Flush Instruction:  use only a 10ML syringe; flush with a push; pause motion and 10ML saline daily    Blood Sampling Instructions  Flush 53m saline, waste 2-5 ml, obtain specimen, flush 253msaline and For multi-lumen line, draw specimen from distal port after stopping infusion for 2 minutes    Dressing Care Instructions:  Stat Lock securement device should be changed weekly with dressing change and as needed.  Change PICC cap/valve weekly and as needed, Use sterile technique, cover PICC and the device securement with transparent dressing at least weekly and as needed. Change first PICC dressing on DATE: 04-19-2021.      Peripherally Inserted Central Catheter (PICC)   Home Guide     A peripherally inserted central catheter (PICC) is a long, thin, flexible tube that is inserted into a vein in the upper arm. It is a form of intravenous (IV) access. It is considered to be a "central" line because the tip of the PICC ends in a large vein in your chest. This large vein is called the superior vena cava (SVC). The PICC tip ends in the SVC because there is a lot of blood flow in the SVC. This allows medicines and IV fluids to be quickly distributed throughout the body. The PICC is inserted using a sterile technique by a specially trained nurse or physician. After the PICC is inserted, a chest X-ray is done or a vascular positioning system is used to be sure it is in the correct place.   A PICC may be placed for different reasons, such as:   To give medicines and liquid nutrition that can only be given through a central line.  Examples are:   Certain antibiotic treatments.   Chemotherapy.   Total parenteral nutrition (TPN).   To take frequent blood samples.   To give IV fluids and blood products.   If there is difficulty placing a peripheral intravenous (PIV) catheter.  If taken care of properly, a PICC can remain in place for several months. A PICC can also allow patients to go home early. Medicine and PICC care can be managed at home by a family member or home healthcare team.   RISKS AND COMPLICATIONS   Possible problems with a PICC can occasionally occur. This may include:   A clot (thrombus) forming in or at the tip of the PICC. This can cause the PICC to become clogged. A "clot-busting" medicine called tissue plasminogen activator (tPA) can be inserted into the PICC to help break up the clot.   Inflammation of the vein (phlebitis) in which the PICC is placed. Signs of inflammation may include redness, pain at the insertion site, red streaks, or being able to feel a "cord" in the vein where the PICC is located.   Infection in the PICC or at the insertion site. Signs of infection may include fever, chills, redness, swelling, or pus drainage from the PICC insertion site.   PICC movement (malposition). The PICC tip may migrate from its original position due to excessive physical activity,  forceful coughing, sneezing, or vomiting.   A break or cut in the PICC. It is important to not use scissors near the PICC.   Nerve or tendon irritation or injury during PICC insertion.  HOME CARE INSTRUCTIONS   Activity   You may bend your arm and move it freely. If your PICC is near or at the bend of your elbow, avoid activity with repeated motion at the elbow.   Avoid lifting heavy objects as instructed by your caregiver.   Avoid using a crutch with the arm on the same side as your PICC. You may need to use a walker.  PICC Dressing   Keep your PICC bandage (dressing) clean and dry to prevent infection.   Ask your caregiver when you may shower. Ask  your caregiver to teach you how to wrap the PICC when you do take a shower.   Do not bathe, swim, or use hot tubs when you have a PICC.   Change the PICC dressing as instructed by your caregiver.   Change your PICC dressing if it becomes loose or wet.  General PICC Care   Check the PICC insertion site daily for leakage, redness, swelling, or pain.   Flush the PICC as directed by your caregiver. Let your caregiver know right away if the PICC is difficult to flush or does not flush. Do not use force to flush the PICC.   Do not use a syringe that is less than 10 mLs to flush the PICC.   Never pull or tug on the PICC.   Avoid blood pressure checks on the arm with the PICC.   Keep your PICC identification card with you at all times.   Do not take the PICC out yourself. Only a trained clinical professional should remove the PICC.  SEEK IMMEDIATE MEDICAL CARE IF:   Your PICC is accidently pulled all the way out. If this happens, cover the insertion site with a bandage or gauze dressing. Do not throw the PICC away. Your caregiver will need to inspect it.   Your PICC was tugged or pulled and has partially come out. Do not push the PICC back in.   There is any type of drainage, redness, or swelling where the PICC enters the skin.   You cannot flush the PICC, it is difficult to flush, or the PICC leaks around the insertion site when it is flushed.   You hear a "flushing" sound when the PICC is flushed.   You have pain, discomfort, or numbness in your arm, shoulder, or jaw on the same side as the PICC .   You feel your heart "racing" or skipping beats.   You notice a hole or tear in the PICC.   You develop chills or a fever.  MAKE SURE YOU:   Understand these instructions.   Will watch your condition.   Will get help right away if you are not doing well or get worse.    This information is not intended to replace advice given to you by your health care provider. Make sure you discuss any questions you have with your health care  provider.    PICC DISCHARGE INSTRUCTIONS      PICC Information:  4 FR Power PICC, Bard SOLO  with 3 lumen(s).  Placed in Left, Brachial Vein with tip location of Cavoatrial Junction.   Total Catheter length is 47cm.  External Catheter length is 1cm.  Placed on 04-29-2021.  Placed by R. Madalina Rosman Therapist, sports  [facility] Vascular Access Team:   Office Phone with Voicemail [phone] ext: [phone]  Pager [phone], pager [phone]     Flush Instruction:  use only a 10ML syringe; flush with a push; pause motion and 10ML saline daily    Blood Sampling Instructions  Flush 8m saline, waste 2-5 ml, obtain specimen, flush 258msaline and For multi-lumen line, draw specimen from distal port after stopping infusion for 2 minutes    Dressing Care Instructions:  Stat Lock securement device should be changed weekly with dressing change and as needed.  Change PICC cap/valve weekly and as needed, Use sterile technique, cover PICC and the device securement with transparent dressing at least weekly and as needed. Change first PICC dressing on DATE: 05-06-2021.

## 2021-04-12 NOTE — Progress Notes (Signed)
[facility]                                               SICU PROGRESS NOTE    Patient Name: [name], [name]  ICU Day: 2  Date of Service: 04/12/2021  MRN: [id number]     BRIEF HPI: per chart    76 y.o. 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 He underwent massive transfusion protocol and ex lap on 04/03/21 at an outside facility with ligation of a bleeding vessel in the liver bed. Patient was subsequently transferred to [facility] postoperaively, intubated. Patient was subsequently extubated later that day on 3/4. Patient remains tachycardic today without further transfusion requirements.           SUBJECTIVE INFORMATION  Overnight Events: No acute events, depressed and "wants to quit fighting"  Response to treatment in last 24 hours: Hemodynamically acceptable   Nursing Concerns: Patient being depressed   Family Concerns: No family at bedside     OBJECTIVE INFORMATION  Vital Signs/Hemodynamics:  Heart Rate: 96  BP (Non-Invasive): 130/88  MAP (Non-Invasive): 102 mmHG  Respiratory Rate: (!) 34  SpO2: 94 %  Temperature: 36.7 C (98.1 F)  Flowtrac/Hemodynamics:     Neuro:  Alert and oriented follows simple commands    Today's Physical Exam:    GEN:  AOx4, resting in bed, no acute distress  PULM: Normal respiratory effort.   CV:  Tachycardic, regular rhythm   ABD:   Abdomen soft, distended, TTP RUQ. JP in place in RUQ with dark pancreatic fluid,  Lower midline incision with brown drainage present. Pigtail catheter present with bloody output.   MS: Atraumatic.  Distal pulses intact.  Normal strength and range of motion of all extremities.    NEURO:   Alert and oriented to person, place and time.  Cranial nerves grossly intact.    Vascular:  All pulses palpable and equal  bilaterally  Integumentary:  Pink, warm, and dry  PSYCHOSOCIAL: Flat depressed affect      Laboratory Data: Pertinent labs available at the time of patient evaluation were reviewed, values can be reviewed in the results section of the EMR.  Results for orders placed or performed during the hospital encounter of 04/03/21 (from the past 24 hour(s))   PTT (PARTIAL THROMBOPLASTIN TIME) - ONCE   Result Value Ref Range    APTT 65.1 (H) 24.2 - 37.5 seconds    Narrative    Therapeutic range for unfractionated heparin is 60-100 seconds.   H & H   Result Value Ref Range    HGB 8.9 (L) 13.4 - 17.5 g/dL    HCT 27.1 (L) 38.9 - 52.0 %   CBC/DIFF    Narrative    The following orders were created for panel order CBC/DIFF.  Procedure                               Abnormality         Status                     ---------                               -----------         ------  CBC WITH DJTT[[id number]]                Abnormal            Final result               MANUAL DIFF AND MORPHOLO.Marland KitchenMarland Kitchen[[id number]]  Abnormal            Final result                 Please view results for these tests on the individual orders.   CBC WITH DIFF   Result Value Ref Range    WBC 31.2 (H) 3.7 - 11.0 x10^3/uL    RBC 3.08 (L) 4.50 - 6.10 x10^6/uL    HGB 9.0 (L) 13.4 - 17.5 g/dL    HCT 28.4 (L) 38.9 - 52.0 %    MCV 92.2 78.0 - 100.0 fL    MCH 29.2 26.0 - 32.0 pg    MCHC 31.7 31.0 - 35.5 g/dL    RDW-CV 16.0 (H) 11.5 - 15.5 %    PLATELETS 513 (H) 150 - 400 x10^3/uL    MPV 11.1 8.7 - 12.5 fL    Narrative    White Blood CountTest(s) repeated and results duplicated.   MANUAL DIFF AND MORPHOLOGY-SYSMEX   Result Value Ref Range    NEUTROPHIL % 87 %    LYMPHOCYTE %  3 %    MONOCYTE % 5 %    EOSINOPHIL % 0 %    BASOPHIL % 0 %    NEUTROPHIL BANDS % 1 %    METAMYELOCYTE %  3 %    NEUTROPHIL # 27.46 (H) 1.50 - 7.70 x10^3/uL    LYMPHOCYTE # 0.94 (L) 1.00 - 4.80 x10^3/uL    MONOCYTE # 1.56 (H) 0.20 - 1.10 x10^3/uL    EOSINOPHIL # <0.10 <=0.50 x10^3/uL     BASOPHIL # <0.10 <=0.20 x10^3/uL    NRBC FROM MANUAL DIFF 7 per 100 WBC    RBC MORPHOLOGY Normal RBC and PLT Morphology    PTT (PARTIAL THROMBOPLASTIN TIME)   Result Value Ref Range    APTT 76.2 (H) 24.2 - 37.5 seconds    Narrative    Therapeutic range for unfractionated heparin is 60-100 seconds.   H & H   Result Value Ref Range    HGB 8.4 (L) 13.4 - 17.5 g/dL    HCT 25.4 (L) 38.9 - 52.0 %   H & H   Result Value Ref Range    HGB 8.1 (L) 13.4 - 17.5 g/dL    HCT 25.4 (L) 38.9 - 52.0 %   PTT (PARTIAL THROMBOPLASTIN TIME)   Result Value Ref Range    APTT 57.9 (H) 24.2 - 37.5 seconds    Narrative    Therapeutic range for unfractionated heparin is 60-100 seconds.   CBC   Result Value Ref Range    WBC 33.5 (H) 3.7 - 11.0 x10^3/uL    RBC 2.82 (L) 4.50 - 6.10 x10^6/uL    HGB 8.1 (L) 13.4 - 17.5 g/dL    HCT 25.8 (L) 38.9 - 52.0 %    MCV 91.5 78.0 - 100.0 fL    MCH 28.7 26.0 - 32.0 pg    MCHC 31.4 31.0 - 35.5 g/dL    RDW-CV 15.9 (H) 11.5 - 15.5 %    PLATELETS 564 (H) 150 - 400 x10^3/uL    MPV 11.9 8.7 - 12.5 fL   BASIC METABOLIC PANEL   Result Value Ref Range  SODIUM 131 (L) 136 - 145 mmol/L    POTASSIUM 4.3 3.5 - 5.1 mmol/L    CHLORIDE 101 96 - 111 mmol/L    CO2 TOTAL 25 23 - 31 mmol/L    ANION GAP 5 4 - 13 mmol/L    CALCIUM 7.8 (L) 8.8 - 10.2 mg/dL    GLUCOSE 140 (H) 65 - 125 mg/dL    BUN 13 8 - 25 mg/dL    CREATININE 0.67 (L) 0.75 - 1.35 mg/dL    BUN/CREA RATIO 19 6 - 22    ESTIMATED GFR >90 >=60 mL/min/BSA   PHOSPHORUS   Result Value Ref Range    PHOSPHORUS 2.5 2.3 - 4.0 mg/dL   MAGNESIUM   Result Value Ref Range    MAGNESIUM 2.2 1.8 - 2.6 mg/dL   HEPATIC FUNCTION PANEL   Result Value Ref Range    ALBUMIN 1.4 (L) 3.4 - 4.8 g/dL     ALKALINE PHOSPHATASE 158 (H) 45 - 115 U/L    ALT (SGPT) 19 10 - 55 U/L    AST (SGOT)  24 8 - 45 U/L    BILIRUBIN TOTAL 0.3 0.3 - 1.3 mg/dL    BILIRUBIN DIRECT 0.2 0.1 - 0.4 mg/dL    PROTEIN TOTAL 4.5 (L) 6.0 - 8.0 g/dL   POC BLOOD GLUCOSE (RESULTS)   Result Value Ref Range    GLUCOSE, POC 333  (H) 70 - 105 mg/dl   POC BLOOD GLUCOSE (RESULTS)   Result Value Ref Range    GLUCOSE, POC 157 (H) 70 - 105 mg/dl   POC BLOOD GLUCOSE (RESULTS)   Result Value Ref Range    GLUCOSE, POC 154 (H) 70 - 105 mg/dl   POC BLOOD GLUCOSE (RESULTS)   Result Value Ref Range    GLUCOSE, POC 138 (H) 70 - 105 mg/dl         Radiographic Studies: Pertinent radiology result available at the time of patient evaluation were reviewed, reports can be reviewed in the results section of the EMR.  No radiographs indicated this morning   SYSTEMS BASED MEDICAL DECISION MAKING    Active Hospital Problems    Diagnosis   . Primary Problem: Intra abdominal hemorrhage       Neurologic  . Pain: CPOT Score: 0   . Agitation (RASS): RASS ([place] Agitation-Sedation Scale): 0-->alert and calm   Delirium (CAM-ICU): CAM-ICU Assessment: Proceed with CAM  Feature 1: Acute Onset or Fluctuating Course: Negative  Feature 2: Inattention: Negative  Feature 3: Altered Level of Consciousness: Negative  Feature 4: Disorganized Thinking: Negative  . Overall CAM-ICU (Delirium Present): Negative  . Analgesia: Tylenol, roxicodone, robaxin, gabapentin, dilaudid   . Sedation: None  . Chemical Paralysis: None  . Delirium: CAM-ICU Negative  . Substance Use Replacement: Non at this time   . Seizure Prophylaxis: Not Indicated  . Neurologic Diagnoses: Clinical depression   . Spontaneous Awakening Trial: Passed  . Neurologic Plan of Care: Maintain sleep/awake cycle, promote sleep hygiene  o Will start anti depressive medications and consult Psychiatry   Pulmonary  . Pulmonary Diagnoses: Airway Compromise  . Mechanical Ventilation - No  . Airway - Spontaneous room air   . Non-Invasive Ventilation - No  . Arterial Blood gas interpretation: Not indicated for collection   . Spontaneous Breathing Trial:Not Appropriate - Exclusion  . Pulmonary Plan of Care: Incentive spirometer for lung recruitment   Cardiovascular  . Cardiovascular Diagnoses: History of HTN, HLD  . Vasoactive  Agents: None required at  this time   . Hemodynamic Monitoring:  Non invasive blood pressure cuff   . Telemetry: Sinus Rhythm  Filed Vitals:    04/12/21 0400 04/12/21 0500 04/12/21 0600 04/12/21 0700   BP: 117/60 131/76 113/72 130/88   Pulse: 86 93 90 96   Resp: '13 17 15 '$ (!) 34   Temp: 36.7 C (98.1 F)      SpO2: 95% 96% 95% 94%     .   Marland Kitchen Antiarrhythmics: None required   . Blood Pressure Management: MAP greater than 65  . Cardiovascular Plan of Care: Will continue to monitor   . HOME MEDICATIONS:  Lipitor 40 mg daily   Amlodipine 5 mg daily- Held   Renal       Intake/Output Summary (Last 24 hours) at 04/12/2021 0730  Last data filed at 04/12/2021 0700  Gross per 24 hour   Intake 3144.64 ml   Output 3975 ml   Net -830.36 ml         . Fluid Balance: Negative 0.5L since midnight  . Urine output- 1.1 L since midnight   . Renal Diagnoses: None at this time   . Electrolyte Disorders: Hyponatremia 131, hypocalcemia 7.8  . Renal Replacement Therapy: No  . Diuresis:No  . IV Fluids: D5 plasmalyte (Rate: 75 cc/hr)  . Renal Plan of Care: Will continue to monitor  .   Gastrointestinal  . Diet/Nutrition: NPO sips and ice chips   . Bowel Movement: Date of Last Bowel Movement: 04/12/21  . Nutrition Access: NONE  . Bowel Regimen: Colace and Senokot  . GI Prophylaxis: Proton Pump inhibitor   . GI Plan of Care: Will attempt PICC line today if Blood cultures are negative and start TPN  . Pig tail drain- 30 ml since midnight (534m) yesterday  . J/P- none since midnight, (665 ml) yesterday   . EC fistula- not documented 150 ml of feculent material   .   Hematologic  . Anemia: Yes Acute Blood Loss H&H 8.1/25.4 stable for 2 collections   . Transfusion Criteria: Hemoglobin less than 7 and Shock  . Thrombocytopenia - No platelets 564  . DVT Prophylaxis: SCDs/ Venodynes/Impulse boots and Heparin gtt @ 17U and hour   . Heme Plan of Care: Continue to monitor for blood loss and continue heparin gtt  . Remote history of PE   . HOME  MEDICATIONS:  o Eliquis 5 mg BID- held   Immune/ID   WBC- 33.5(31.5), afebrile   History HIV positive    ABX-     Cefepime- start 3/11-    Vanc- start 3/9-      Flagyl- start 3/9-   CULTURES-     Blood cultures-      3/9 pseudomonas      3/12 pending    MRSA      3/9 positive   . ID Plan of Care: Will discuss stop dates on ABX, follow up cultures   HOME MEDICATIONS:  -[facility]  . Steroid Use:No  . Insulin use: Sliding Scale  Aggressive Blood sugar- 121-230  . Endocrine Plan of Care: If TPN starts will start Lantus   . HOME MEDICATIONS;   Novolin 4U TID with meals- held   Lantus 15U daily- held   Levothyroxine 88 mcg   Musculoskeletal  . Weight Bearing: full weight bearing  . Mobility/Activity: OOB as tolerated   . Head of Bed Elevated: 30 degrees   . MSK Plan of Care: Will nee aggressive PT/OT for  physical deconditioning   Integumentary  . Skin Breakdown: Surgical sites   . Skin Prophylaxis: Mepilex and Turn every 2 hours  . Skin/Wound Plan of Care: Will continue to promote healthy skin integrity     Patient Lines/Drains/Airways Status     Active Line / Dialysis Catheter / Dialysis Graft / Drain / Airway / Wound     Name Placement date Placement time Site Days    Peripheral IV Ultrasound guided Proximal;Right Basilic  (medial side of arm) 04/06/21  0958  -- 5    Peripheral IV Ultrasound guided Anterior;Distal;Left;Upper Arm 04/09/21  1023  -- 2    Peripheral IV Ultrasound guided Distal;Right Forearm 04/09/21  1030  -- 2    Terrial Rhodes Drain Right;Upper Abdomen 04/03/21  1055  -- 8    Drain (Miscellaneous) #65F pigtail catheter for RLQ fluid collection Lower;Right;Anterior Abdomen 04/09/21  1433  -- 2    Surgical Incision Abdomen 04/03/21  1046  -- 8    Surgical Incision Other (Comment) Left;Upper Abdomen 04/03/21  1400  -- 8                Patient has decision making capacity: yes  Family Updated today: no  Advance Directive: Medical Power of [employer] Services: Surgical Oncology     I  independently of the faculty provider spent a total of 40 minutes of critical care time  in direct care of this patient including initial evaluation, review of laboratory, radiology, diagnostic studies, review of medical record, order entry and coordination of care.      Samule Ohm, PA-C 04/12/2021, 07:30

## 2021-04-12 NOTE — Consults (Addendum)
PATIENT NAME:  Pillager NUMBER:  [id number]  DATE OF SERVICE: 04/12/2021  DATE OF BIRTH:  October 24, 1945      INFECTIOUS DISEASE CONSULT FOLLOW-UP NOTE    HOSPITAL DAY:  LOS: 9 days     REASON FOR CONSULTATION:  Gram-negative rod bacteremia and possible intra-abdominal infected hematoma in a patient with well controlled HIV infection    SUBJECTIVE: This morning, [name] reports that he is experiencing some abdominal pain.  He states that he feels like there is a "belt" across the upper portion of his abdomen.  He also reports that he is concerned about the enterocutaneous fistula.  With regards to his antimicrobial therapy, he reports that he is tolerating it well.  He denies experiencing any adverse reactions including rash, itching, or diarrhea.  He does note that he had an episode of emesis when he took one of his pills this morning.  He has remained afebrile.  He was transferred out of the ICU by the primary team today.    OBJECTIVE:  Vitals:  BP 105/61   Pulse 95   Temp 36.7 C (98.1 F)   Resp 19   Ht 1.753 m ('5\' 9"'$ )   Wt 88.3 kg (194 lb 10.7 oz)   SpO2 90%   BMI 28.75 kg/m   General:  Overall, the patient appears stated age.  He appears acutely ill, but non-toxic. He is in no acute distress  Eyes:  Conjunctiva are clear bilaterally.  Pupils are equal and round.  Sclera are non-icteric.  HENT:  Head is atraumatic and normocephalic.  Mucous membranes are moist.  There are no oral lesions.  Cardiovascular:  Heart is regular rate and rhythm with a normal S1 and S2.  There are no murmurs.  Respiratory:  Lungs are clear to auscultation bilaterally.  Abdomen:  There is a vertical midline incision in place with no surrounding erythema or induration.  There are staples in place.  There is drainage of fecal material from the inferior aspect of the incision consistent with enterocutaneous fistula. There are two drains in place on the right including a JP drain and a pigtail drain with  sanguinous appearing output.  Mildly distended.  Diffusely tender to palpation.  Bowel sounds are normoactive.  Extremities:  There is no joint erythema or swelling.  There is 1-2+ bilateral lower extremity edema.  Skin:  There are no diffuse rashes.  Neurologic:  Patient is awake, alert, and oriented x 3.  CN II-XII are in tact.  There are no focal deficits.    CURRENT ANTIMICROBIAL THERAPY:  Cefepime 2 g IV q.8 hours  Metronidazole 500 mg p.o. B.i.d.  Vancomycin 1 g IV q.12 hours  Genvoya 150-150-200-10 mg 1 tablet p.o. Daily    ACCESS SITES:  Patient Lines/Drains/Airways Status     Active Line / Dialysis Catheter / Dialysis Graft / Drain / Airway / Wound     Name Placement date Placement time Site Days    Peripheral IV Ultrasound guided Proximal;Right Basilic  (medial side of arm) 04/06/21  0958  -- 5    Peripheral IV Ultrasound guided Anterior;Distal;Left;Upper Arm 04/09/21  1023  -- 2    Peripheral IV Ultrasound guided Distal;Right Forearm 04/09/21  1030  -- 2    [name] Drain Right;Upper Abdomen 04/03/21  1055  -- 8    Drain (Miscellaneous) #24F pigtail catheter for RLQ fluid collection Lower;Right;Anterior Abdomen 04/09/21  1433  -- 2    Surgical Incision Abdomen  04/03/21  1046  -- 8    Surgical Incision Other (Comment) Left;Upper Abdomen 04/03/21  1400  -- 8                LABORATORY STUDIES:   Results for orders placed or performed during the hospital encounter of 04/03/21 (from the past 24 hour(s))   PTT (PARTIAL THROMBOPLASTIN TIME) - ONCE   Result Value Ref Range    APTT 65.1 (H) 24.2 - 37.5 seconds    Narrative    Therapeutic range for unfractionated heparin is 60-100 seconds.   CBC/DIFF    Narrative    The following orders were created for panel order CBC/DIFF.  Procedure                               Abnormality         Status                     ---------                               -----------         ------                     CBC WITH PYPP[[id number]]                Abnormal             Final result               MANUAL DIFF AND MORPHOLO.Marland KitchenMarland Kitchen[[id number]]  Abnormal            Final result                 Please view results for these tests on the individual orders.   CBC WITH DIFF   Result Value Ref Range    WBC 31.2 (H) 3.7 - 11.0 x10^3/uL    RBC 3.08 (L) 4.50 - 6.10 x10^6/uL    HGB 9.0 (L) 13.4 - 17.5 g/dL    HCT 28.4 (L) 38.9 - 52.0 %    MCV 92.2 78.0 - 100.0 fL    MCH 29.2 26.0 - 32.0 pg    MCHC 31.7 31.0 - 35.5 g/dL    RDW-CV 16.0 (H) 11.5 - 15.5 %    PLATELETS 513 (H) 150 - 400 x10^3/uL    MPV 11.1 8.7 - 12.5 fL    Narrative    White Blood CountTest(s) repeated and results duplicated.   MANUAL DIFF AND MORPHOLOGY-SYSMEX   Result Value Ref Range    NEUTROPHIL % 87 %    LYMPHOCYTE %  3 %    MONOCYTE % 5 %    EOSINOPHIL % 0 %    BASOPHIL % 0 %    NEUTROPHIL BANDS % 1 %    METAMYELOCYTE %  3 %    NEUTROPHIL # 27.46 (H) 1.50 - 7.70 x10^3/uL    LYMPHOCYTE # 0.94 (L) 1.00 - 4.80 x10^3/uL    MONOCYTE # 1.56 (H) 0.20 - 1.10 x10^3/uL    EOSINOPHIL # <0.10 <=0.50 x10^3/uL    BASOPHIL # <0.10 <=0.20 x10^3/uL    NRBC FROM MANUAL DIFF 7 per 100 WBC    RBC MORPHOLOGY Normal RBC and PLT Morphology    PTT (PARTIAL THROMBOPLASTIN TIME)   Result Value Ref Range  APTT 76.2 (H) 24.2 - 37.5 seconds    Narrative    Therapeutic range for unfractionated heparin is 60-100 seconds.   H & H   Result Value Ref Range    HGB 8.4 (L) 13.4 - 17.5 g/dL    HCT 25.4 (L) 38.9 - 52.0 %   H & H   Result Value Ref Range    HGB 8.1 (L) 13.4 - 17.5 g/dL    HCT 25.4 (L) 38.9 - 52.0 %   PTT (PARTIAL THROMBOPLASTIN TIME)   Result Value Ref Range    APTT 57.9 (H) 24.2 - 37.5 seconds    Narrative    Therapeutic range for unfractionated heparin is 60-100 seconds.   CBC   Result Value Ref Range    WBC 33.5 (H) 3.7 - 11.0 x10^3/uL    RBC 2.82 (L) 4.50 - 6.10 x10^6/uL    HGB 8.1 (L) 13.4 - 17.5 g/dL    HCT 25.8 (L) 38.9 - 52.0 %    MCV 91.5 78.0 - 100.0 fL    MCH 28.7 26.0 - 32.0 pg    MCHC 31.4 31.0 - 35.5 g/dL    RDW-CV 15.9 (H) 11.5 - 15.5 %     PLATELETS 564 (H) 150 - 400 x10^3/uL    MPV 11.9 8.7 - 12.5 fL   BASIC METABOLIC PANEL   Result Value Ref Range    SODIUM 131 (L) 136 - 145 mmol/L    POTASSIUM 4.3 3.5 - 5.1 mmol/L    CHLORIDE 101 96 - 111 mmol/L    CO2 TOTAL 25 23 - 31 mmol/L    ANION GAP 5 4 - 13 mmol/L    CALCIUM 7.8 (L) 8.8 - 10.2 mg/dL    GLUCOSE 140 (H) 65 - 125 mg/dL    BUN 13 8 - 25 mg/dL    CREATININE 0.67 (L) 0.75 - 1.35 mg/dL    BUN/CREA RATIO 19 6 - 22    ESTIMATED GFR >90 >=60 mL/min/BSA   PHOSPHORUS   Result Value Ref Range    PHOSPHORUS 2.5 2.3 - 4.0 mg/dL   MAGNESIUM   Result Value Ref Range    MAGNESIUM 2.2 1.8 - 2.6 mg/dL   HEPATIC FUNCTION PANEL   Result Value Ref Range    ALBUMIN 1.4 (L) 3.4 - 4.8 g/dL     ALKALINE PHOSPHATASE 158 (H) 45 - 115 U/L    ALT (SGPT) 19 10 - 55 U/L    AST (SGOT)  24 8 - 45 U/L    BILIRUBIN TOTAL 0.3 0.3 - 1.3 mg/dL    BILIRUBIN DIRECT 0.2 0.1 - 0.4 mg/dL    PROTEIN TOTAL 4.5 (L) 6.0 - 8.0 g/dL   POC BLOOD GLUCOSE (RESULTS)   Result Value Ref Range    GLUCOSE, POC 333 (H) 70 - 105 mg/dl   POC BLOOD GLUCOSE (RESULTS)   Result Value Ref Range    GLUCOSE, POC 157 (H) 70 - 105 mg/dl   POC BLOOD GLUCOSE (RESULTS)   Result Value Ref Range    GLUCOSE, POC 154 (H) 70 - 105 mg/dl   POC BLOOD GLUCOSE (RESULTS)   Result Value Ref Range    GLUCOSE, POC 138 (H) 70 - 105 mg/dl       MICROBIOLOGY:   Blood cultures 04/08/2021:  Positive for Pseudomonas aeruginosa in 1/2 bottles  MRSA nasal PCR screen 04/08/2021:  Positive  Blood cultures 04/09/2021:  No growth x 3 days  Right upper quadrant fluid culture 04/09/2021:  Positive for Pseudomonas aeruginosa and Enterococcus faecium  HIV RNA quantitative PCR 04/10/2021:  Pending  Blood Cultures 04/11/2021:  No growth x 18-24 hours    IMAGING STUDIES:  No new imaging studies for review    ASSESSMENT:  [name] is a very pleasant 76 y.o. gentleman with a past medical history significant for HIV infection, stable on Genvoya, hypertension, hyperlipidemia. DVT/PE, MSSA  right native hip septic arthritis, perforated diverticulitis s/p colectomy, hypothyroidism, and intraductal papillary mucinous neoplasm (IPMN) who was admitted on 04/03/21 with intra-abdominal hemorrhage.  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  His post-operative course was complicated by development of a PE for which he was started on Eliquis.  He presented to [facility] on 04/[facility]/21 due to increased bleeding around his JP drain at which time a CTA showed extravasation around the pancreatic resection site.  At that facility he underwent exploratory laparotomy with ligation of bleeding vessels prior to transfer here for further management.  Following admission here, he developed worsening leukocytosis.  Blood cultures drawn on 04/08/21 are now positive for Pseudomonas aeruginosa in 1/2 sets drawn from the midline.  CT scan of the chest, abdomen, and pelvis obtained on 04/08/21 showed extensive multiloculated RUQ fluid collection and additional complex fluid collections in the pancreatectomy bed and splenectomy bed, but no active bleeding.  On 04/09/21, he underwent IR-guided RUQ peritoneal drain placement and empiric embolization of the splenic artery stump and left inferior adrenal artery.  The abdominal fluid was bloody in appearance with cultures now positive for Pseudomonas aeruginosa and Enterococcus faecium.  He is currently on antimicrobial therapy with vancomycin, cefepime, and metronidazole, which he is tolerating well.        RECOMMENDATIONS:    Pseudomonas Bacteremia with Concern for Infected Intra-abdominal Hematoma  1. Continue to follow final results of blood cultures from 04/08/21  2. Continue to follow pending blood cultures from 04/09/21 and 04/10/21, which are no growth to date  3. Please discontinue antimicrobial therapy with vancomycin, cefepime, and flagyl  4. Please initiate  antimicrobial therapy with daptomycin dosed 10 mg/kg IV every 24 hours.  Please check a baseline CK prior to the first dose.  5. Please initiate antimicrobial therapy with meropenem dosed 1g IV every 8 hours.  6. Continue to follow culture results of abdominal fluid, which are now positive for Pseudomonas aeruginosa and E. Faecium.  7. Please continue to monitor CBC with differential, BUN, creatinine, hepatic function panel, CK, and CRP while the patient is on antimicrobial therapy.  8. The patient will ultimately require a 4 week treatment course of antimicrobial therapy from the date of drainage with interval re-imaging prior to discontinuation.    HIV Infection  1. CD4 count obtained on 04/03/21 was low at 117 with preserved percentage and CD4:CD8 ratio.    2. Repeat CD4 count on 04/10/21 was 273.  No need for prophylaxis.  3. Please continue antiretroviral therapy with Genvoya.  Please check for any drug interactions with this agent prior to initiation of new medications  4. Continue to follow pending HIV PCR    Thank you for this consultation.  We will continue to follow    On the day of the encounter, a total of (30) minutes was spent in direct/indirect care of this patient including evaluation, review and interpretation of laboratory and microbiology studies, review of radiology studies and the medical record, order entry and coordination  of care. counseling and educating the patient/family/caregiver, and documentation.    MDM Level    1. Problems addressed: High (>or= chronic illness w severe exacerbation, progression, or treatment side-effects; 1 acute or chronic illness or injury that poses a threat to life or bodily function): Pseudomonas bacteremia, HIV infection, Intra-abdominal infected hematoma  2. Data reviewed & analyzed: High: 2 of 3 following: review of external notes, review of tests results, ordering of tests, assessment requiring an independent historian; independent interpretation of a test  performed by another provider not separately reported; discussion of management or test interpretation w external provider: I have reviewed all laboratory studies, microbiology data, and discussed the plan of care with the surgical oncology team  3. Risk of morbidity: High (ex: drug therapy requiring intensive monitoring, decision regarding major surgery with risk factors, decision regarding hospitalization or escalation of hospital level of care): The patient is receiving IV antimicrobial therapy, which requires intensive monitoring      Valorie Roosevelt, MD, PhD  Associate Professor  Department of Internal Medicine  Section of Infectious Diseases  [facility]

## 2021-04-12 NOTE — Care Management Notes (Signed)
[facility]  Care Management Note    Patient Name: [name]  Date of Birth: [birth year]  Sex: male  Date/Time of Admission: 04/03/2021  2:01 PM  Room/Bed: 05/A  Payor: MEDICARE / Plan: MEDICARE PART A AND B / Product Type: Medicare /    LOS: 9 days   Primary Care Providers:  Fisher (General)    Admitting Diagnosis:  Intra abdominal hemorrhage [R58]    Assessment:      04/12/21 1600   Assessment Details   Assessment Type Continued Assessment   Date of Care Management Update 04/12/21   Date of Next DCP Update 04/15/21   Care Management Plan   Discharge Planning Status plan in progress   Projected Discharge Date 04/16/21   CM will evaluate for rehabilitation potential yes   Discharge Needs Assessment   Equipment Needed After Discharge other (see comments)   Discharge Facility/Level of Care Needs Acute Rehab Placement/Return (not psych)(code 20)   Transportation Available other (see comments)  (TBD)       Discharge Plan:  Acute Rehab Placement/Return (not psych) (code 56)  Patient admitted with intra abdominal hemorrhage. Patient is currently on 3L N/C, wean as tolerate.  Continue aggressive pulmonary hygiene. PICC to be placed today and start TPN.  ID following. Per ID, patient will ultimately require a 4 week treatment course of antimicrobial therapy from the date of drainage with interval re-imaging prior to discontinuation. PT/OT recommending AR.     The patient will continue to be evaluated for developing discharge needs.     Case Manager: Arcola Jansky, [place]  Phone: [phone]

## 2021-04-12 NOTE — Progress Notes (Signed)
Pt is okay to have a PICC line placed with blood cultures from 04/09/21 being NGTD .     [name], MD, 13:56  04/12/2021  PGY-2 General Surgery

## 2021-04-12 NOTE — Care Plan (Signed)
Medical Nutrition Therapy Assessment        SUBJECTIVE : Consulted to start TPN, pt has been NPO or on liquids for 9 days.  Visited pt this morning and he was resting and wife did all the talking.  She reports he's had some weight loss since probably October-November b/c he was told his A1C was 9.3 and at that time he was eating 5 candy bars a day and drinking power drinks so he stopped all that and "went all in" and watched what he ate.  Since his initial surgery on Feb 21st, he's been in and out of the hospital probably not eating really well, she reports he does have a lot of fluid weight on him and our admission bed weight of 181 lbs is incorrect or high b/c of fluid, his last standing was in March was 167 lbs. She is aware we are starting TPN once he has access.     OBJECTIVE:     Current Diet Order/Nutrition Support:  MNT PROTOCOL FOR DIETITIAN  ROOM SERVICE:  NEEDS VISIT FOR MENU CHOICES  MNT PROTOCOL FOR DIETITIAN  DIET NPO - NOW STRICT, EXCEPT ALL MEDS WITH SIPS OF WATER, EXCEPT ICE CHIPS, EXCEPT SIPS OF WATER       Height Used for Calculations: 175.3 cm (5' 9.02")  Weight Used For Calculations: 75.9 kg (167 lb 5.3 oz) (standing weight on 3/3)  BMI (kg/m2): 24.75  BMI Assessment: BMI 18.5-24.9: normal  Ideal Body Weight (IBW) (kg): 73.73  % Ideal Body Weight: 102.94     Usual Body Weight: 81.3 kg (179 lb 3.7 oz)  Weight Loss:  (intentional weight loss)      Estimated Needs:    Energy Calorie Requirements: 2100-2300 cal (28-30 cal/75.9 kg)  Protein Requirements (gms/day): 88-110 g prot (1.2-1.5 g/73.7 kg)   Fluid Requirements: 2100-2300 ml (28-30 ml/75.9 k)       Comments: 76 y.o. 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 He underwent massive transfusion protocol and ex lap on 04/03/21 at an outside facility with ligation of a bleeding  vessel in the liver bed. Patient was subsequently transferred to [facility] postoperaively, intubated. Patient was subsequently extubated later that day on 3/4. 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    Latest Reference Range & Units 03/17/21 09:17   HEMOGLOBIN A1C <5.7 % 9.3 (H)   (H): Data is abnormally high       Latest Reference Range & Units 04/12/21 00:33 04/12/21 05:18 04/12/21 12:00   GLUCOSE, POC 70 - 105 mg/dl 154 (H) 138 (H) 230 (H)   (H): Data is abnormally high     Latest Reference Range & Units 04/12/21 00:33   TRIGLYCERIDES <150 mg/dL 91       Plan/Interventions :   Note pt is at risk for re-feeding, will start TPN slowly once access is placed: 900 cal (12 cal/kg), 50 g prot, 430 dextrose cal, 270 lipids at ~37 ml/hr    100 mg thiamine x 5 days and re-evaluate the need  Oral multivitamin/mineral due to IV shortage    Monitor potassium, magnesium and phosphorus daily along with blood sugars and BMP and will advance TPN to goal as able (slowly by 200-400 calories at a time if signs of refeeding)     Goal  TPN  Rate: ~79 ml/hr  2200 kcal /day = 29 kcal/kg  110 grams Protein = 1.5 grams/ kg  1100 Dextrose kcals, GIR 3  660 IL kcals or 0.9 grams Fat/kg    Monitor LFTs and triglycerides weekly.   Monitor weekly weights.   Will continue to follow.         Nutrition Diagnosis: Altered GI function related to midline fistula draining stool as evidenced by need for TPN    Dierdre Searles, RD, LD, CNSC 04/12/2021, 13:39  Pager [phone]

## 2021-04-12 NOTE — Progress Notes (Signed)
[facility]  Surgical Oncology  Progress Note      [name], [name], 76 y.o. male  Date of Birth:  [birth year]  Date of Admission:  04/03/2021  Date of service: 04/12/2021    Assessment:  This is a 76 y.o. 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 He underwent massive transfusion protocol and ex lap on 04/03/21 at an outside facility with ligation of a bleeding vessel in the liver bed. Patient was subsequently transferred to [facility] postoperaively, intubated. Patient was subsequently extubated later that day on 3/4. 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       Plan/Recommendations:  - Transfer to stepdown  - Diet: Regular diet  - Aspiration precautions - HOB 30  - Patient underwent drain placement & emobolization with IR on 3/10         - JP continues to drain pancreatic fluid               -- Amylase level > 6,554 04/06/21 & 04/08/21 and 04/10/21, repeat amylase tomorrow   -Leukocytosis   -CT C/A/P                  -- Peripancreatic fluid collections                  -- IR consulted; drain placement 3/10- now with bloody output again- hold hep gtt              - Blood cultures 3/9: + pseudomonas    - Cultures form R JP: Pseudomonas and enterococcus              - Repeat Blood cultures today   - Place PICC and start TPN   - Continue Vanc, Cefepime, flagyl               - ID consulted   - PE   - Continue low intensity, no bolus heparin drip              - Trend H&H    - Encouraged IS and ambulation   - Aggressive plum toilet   - Wean O2   - C/f for midline fistula        - increased brown drainage from midline incision        - ostomy bag added to monitor output       - Continue to monitor     - OOB TID  - PT/OT ordered    Subjective: Complains of some RUQ pain. Denies any fevers or chills. O2 requirement is decreased  to 1L this AM. Feculent drainage from midline incision this AM. Denies any nausea/vomiting.     Objective  Filed Vitals:    04/12/21 0400 04/12/21 0500 04/12/21 0600 04/12/21 0700   BP: 117/60 131/76 113/72 130/88   Pulse: 86 93 90 96   Resp: '13 17 15 '$ (!) 34   Temp: 36.7 C (98.1 F)      SpO2: 95% 96% 95% 94%     Physical Exam:   GEN:  AOx4, resting in bed, no acute distress  PULM: Normal respiratory effort.   CV:  Tachycardic, regular rhythm   ABD:   Abdomen soft, distended, TTP RUQ. JP in place in RUQ with dark pancreatic fluid,  Lower midline incision with brown  drainage present. Pigtail catheter present with bloody output.   MS: Atraumatic.  Distal pulses intact.  Normal strength and range of motion of all extremities.    NEURO:   Alert and oriented to person, place and time.  Cranial nerves grossly intact.    Vascular:  All pulses palpable and equal bilaterally  Integumentary:  Pink, warm, and dry  PSYCHOSOCIAL: Pleasant.  Normal affect.     Labs  Results for orders placed or performed during the hospital encounter of 04/03/21 (from the past 24 hour(s))   PTT (PARTIAL THROMBOPLASTIN TIME) - ONCE   Result Value Ref Range    APTT 65.1 (H) 24.2 - 37.5 seconds    Narrative    Therapeutic range for unfractionated heparin is 60-100 seconds.   H & H   Result Value Ref Range    HGB 8.9 (L) 13.4 - 17.5 g/dL    HCT 27.1 (L) 38.9 - 52.0 %   CBC/DIFF    Narrative    The following orders were created for panel order CBC/DIFF.  Procedure                               Abnormality         Status                     ---------                               -----------         ------                     CBC WITH EGBT[[id number]]                Abnormal            Final result               MANUAL DIFF AND MORPHOLO.Marland KitchenMarland Kitchen[[id number]]  Abnormal            Final result                 Please view results for these tests on the individual orders.   CBC WITH DIFF   Result Value Ref Range    WBC 31.2 (H) 3.7 - 11.0 x10^3/uL    RBC 3.08 (L)  4.50 - 6.10 x10^6/uL    HGB 9.0 (L) 13.4 - 17.5 g/dL    HCT 28.4 (L) 38.9 - 52.0 %    MCV 92.2 78.0 - 100.0 fL    MCH 29.2 26.0 - 32.0 pg    MCHC 31.7 31.0 - 35.5 g/dL    RDW-CV 16.0 (H) 11.5 - 15.5 %    PLATELETS 513 (H) 150 - 400 x10^3/uL    MPV 11.1 8.7 - 12.5 fL    Narrative    White Blood CountTest(s) repeated and results duplicated.   MANUAL DIFF AND MORPHOLOGY-SYSMEX   Result Value Ref Range    NEUTROPHIL % 87 %    LYMPHOCYTE %  3 %    MONOCYTE % 5 %    EOSINOPHIL % 0 %    BASOPHIL % 0 %    NEUTROPHIL BANDS % 1 %    METAMYELOCYTE %  3 %    NEUTROPHIL # 27.46 (H) 1.50 - 7.70 x10^3/uL    LYMPHOCYTE # 0.94 (L) 1.00 -  4.80 x10^3/uL    MONOCYTE # 1.56 (H) 0.20 - 1.10 x10^3/uL    EOSINOPHIL # <0.10 <=0.50 x10^3/uL    BASOPHIL # <0.10 <=0.20 x10^3/uL    NRBC FROM MANUAL DIFF 7 per 100 WBC    RBC MORPHOLOGY Normal RBC and PLT Morphology    PTT (PARTIAL THROMBOPLASTIN TIME)   Result Value Ref Range    APTT 76.2 (H) 24.2 - 37.5 seconds    Narrative    Therapeutic range for unfractionated heparin is 60-100 seconds.   H & H   Result Value Ref Range    HGB 8.4 (L) 13.4 - 17.5 g/dL    HCT 25.4 (L) 38.9 - 52.0 %   H & H   Result Value Ref Range    HGB 8.1 (L) 13.4 - 17.5 g/dL    HCT 25.4 (L) 38.9 - 52.0 %   PTT (PARTIAL THROMBOPLASTIN TIME)   Result Value Ref Range    APTT 57.9 (H) 24.2 - 37.5 seconds    Narrative    Therapeutic range for unfractionated heparin is 60-100 seconds.   CBC   Result Value Ref Range    WBC 33.5 (H) 3.7 - 11.0 x10^3/uL    RBC 2.82 (L) 4.50 - 6.10 x10^6/uL    HGB 8.1 (L) 13.4 - 17.5 g/dL    HCT 25.8 (L) 38.9 - 52.0 %    MCV 91.5 78.0 - 100.0 fL    MCH 28.7 26.0 - 32.0 pg    MCHC 31.4 31.0 - 35.5 g/dL    RDW-CV 15.9 (H) 11.5 - 15.5 %    PLATELETS 564 (H) 150 - 400 x10^3/uL    MPV 11.9 8.7 - 12.5 fL   BASIC METABOLIC PANEL   Result Value Ref Range    SODIUM 131 (L) 136 - 145 mmol/L    POTASSIUM 4.3 3.5 - 5.1 mmol/L    CHLORIDE 101 96 - 111 mmol/L    CO2 TOTAL 25 23 - 31 mmol/L    ANION GAP 5 4 - 13  mmol/L    CALCIUM 7.8 (L) 8.8 - 10.2 mg/dL    GLUCOSE 140 (H) 65 - 125 mg/dL    BUN 13 8 - 25 mg/dL    CREATININE 0.67 (L) 0.75 - 1.35 mg/dL    BUN/CREA RATIO 19 6 - 22    ESTIMATED GFR >90 >=60 mL/min/BSA   PHOSPHORUS   Result Value Ref Range    PHOSPHORUS 2.5 2.3 - 4.0 mg/dL   MAGNESIUM   Result Value Ref Range    MAGNESIUM 2.2 1.8 - 2.6 mg/dL   HEPATIC FUNCTION PANEL   Result Value Ref Range    ALBUMIN 1.4 (L) 3.4 - 4.8 g/dL     ALKALINE PHOSPHATASE 158 (H) 45 - 115 U/L    ALT (SGPT) 19 10 - 55 U/L    AST (SGOT)  24 8 - 45 U/L    BILIRUBIN TOTAL 0.3 0.3 - 1.3 mg/dL    BILIRUBIN DIRECT 0.2 0.1 - 0.4 mg/dL    PROTEIN TOTAL 4.5 (L) 6.0 - 8.0 g/dL   POC BLOOD GLUCOSE (RESULTS)   Result Value Ref Range    GLUCOSE, POC 333 (H) 70 - 105 mg/dl   POC BLOOD GLUCOSE (RESULTS)   Result Value Ref Range    GLUCOSE, POC 157 (H) 70 - 105 mg/dl   POC BLOOD GLUCOSE (RESULTS)   Result Value Ref Range    GLUCOSE, POC 154 (H) 70 - 105 mg/dl  POC BLOOD GLUCOSE (RESULTS)   Result Value Ref Range    GLUCOSE, POC 138 (H) 70 - 105 mg/dl       I/O:  Date 04/11/21 0700 - 04/12/21 0659 04/12/21 0700 - 04/13/21 0659   Shift 0700-1459 1500-2259 2300-0659 24 Hour Total 0700-1459 1500-2259 2300-0659 24 Hour Total   INTAKE   I.V.(mL/kg/hr) 709.7(1.01) 710.44(1.01) 704.8(1) 2124.94(1) 88.1   88.1     Heparin Volume 109.7 110.44 104.8 324.94 13.1   13.1     Volume Infused (dextrose 5 % in electrolyte-A (PLASMALYTE-A) 1,000 mL infusion) 600 418 307 6972 75   75   IV Piggyback 800.3 218.7  1019         Volume (cefepime (MAXIPIME) 2 g in NS 100 mL IVPB minibag) 190.3 106.7  297         Volume (potassium phosphate 30 mmol in NS 500 mL IVPB) 510   510         Volume (magnesium sulfate 4 G in SW 100 mL premix IVPB) 100   100         Volume (sodium phosphate 30 mmol in D5W 100 mL IVPB)  112  112       Shift Total(mL/kg) 1505(69.79) 929.14(10.53) 704.8(7.98) 4801.65(53.74) 88.1(1)   88.1(1)   OUTPUT   Urine(mL/kg/hr) 1000(1.42) 775(1.1) 1100(1.56)  2875(1.36) 215   215     Urine (Voided) 1000 775 1100 2875 215   215     Urine Occurrence 3 x 2 x  5 x 1 x   1 x   Drains 520 335 30 885         JP Drain Output ([place] Drain Right;Upper Abdomen) 320 155 0 475         Drain Output (Drain (Miscellaneous) #18F pigtail catheter for RLQ fluid collection Lower;Right;Anterior Abdomen) 200 180 30 410       Stool             Stool Occurrence     1 x   1 x   Shift Total(mL/kg) 8270(78.67) 5449(20.10) 0712(19.7) 5883(25.49) 215(2.43)   215(2.43)   Weight (kg) 88.2 88.2 88.3 88.3 88.3 88.3 88.3 88.3       Radiology  Results for orders placed or performed during the hospital encounter of 04/03/21 (from the past 72 hour(s))   CT ANGIO ABDOMEN PELVIS W/WO IV CONTRAST     Status: None (Preliminary result)    Narrative    CT ANGIO ABDOMEN PELVIS W/WO IV CONTRAST performed on 04/09/2021 11:49 AM    INDICATION: 76 years old Male  Abdominal bleeding    TECHNIQUE: Axial images from lung bases through upper thighs followed by intravenous administration of nonionic contrast, with additional multiplanar images in early arterial and venous phases. Arterial coronal and sagittal reformats utilize MIP reconstruction techniques    CONTRAST: 100 cc of Isovue 370    RADIATION DOSE: 2987.10 mGycm    COMPARISON: CT chest abdomen pelvis 04/08/2021.    FINDINGS:  VASCULAR: The abdominal aorta is of normal caliber.  The celiac, superior mesenteric, and inferior mesenteric arteries are patent.  The bilateral renal arteries are patent.  The iliofemoral vessels are normal caliber.  Mild atherosclerotic disease is present. Note is made of a pulmonary embolism involving the right upper and lower lobes. This was present on CT chest abdomen pelvis 03/31/2020. The portal vein and IVC are patent.    NONVASCULAR:  Lung Bases: Greater than right pleural effusions with associated atelectasis are  identified.    Liver: Diffuse fatty change of the liver is present.   There is redemonstration of a loculated,  complex, perihepatic collection resulting in mass effect on the hepatic parenchyma. Overall this is similar in appearance relative to CT 04/08/2021.    Gallbladder/Biliary System: No hyperattenuating foci are present within the gallbladder lumen.  No bile duct dilation is seen.    Spleen: The spleen is surgically absent. Nonloculated fluid as well as surgical train are present within the left upper quadrant.    Pancreas: There are postoperative changes from distal pancreatectomy with postoperative fluid similar in appearance relative to comparison study.    Adrenals: The adrenal glands are grossly unremarkable.    Kidney/Ureters/Bladder: The kidneys are symmetric in size. Bilateral simple to minimally complex cysts are again noted. There is no evidence of hydronephrosis. Mild bilateral perinephric stranding is unchanged. The ureters are of normal caliber.  The bladder is partially distended limiting evaluation..    Reproductive Organs: The prostate is normal in size.    Bowel: There is narrowing of the pylorus and first portion of the duodenum secondary to extrinsic compression by multiple abdominal fluid collections. This is unchanged. Fluid collection along the greater curvature is redemonstrated and unchanged. Enteric contrast is noted throughout the large bowel and present to the level the rectum. No abnormally dilated loops of bowel are identified to suggest obstruction.    Lymph Nodes: No suspicious lymph nodes are seen throughout the abdomen and pelvis.    Peritoneal Cavity: Multiple complex fluid collections within the right upper quadrant as well as fluid throughout the mid abdomen and left upper quadrant are present and described above. A small amount of residual pneumoperitoneum is noted and decreased relative to comparison study. Postoperative changes along the abdominal midline wall are present.    Soft Tissues: Anasarca.    Bones: Vertebral body height and alignment is maintained. Bilateral femoral head  avascular necrosis is noted.        Impression    1.No acute bleed.  2.Stable multiloculated right perihepatic collection and fluid collections within the mid abdomen and left upper quadrant.  3.Partially imaged subocclusive, segmental, right pulmonary emboli previously demonstrated on CT 03/31/2021.  4.Right greater than left pleural effusion and atelectasis.         Current Medications:  Current Facility-Administered Medications   Medication Dose Route Frequency    acetaminophen (TYLENOL) tablet  650 mg Oral Q4H    atorvastatin (LIPITOR) tablet  40 mg Oral Daily with Breakfast    benzonatate (TESSALON) capsule  100 mg Oral Q8H PRN    cefepime (MAXIPIME) 2 g in NS 100 mL IVPB minibag  2 g Intravenous Q8H    D5W 250 mL flush bag   Intravenous Q15 Min PRN    dextrose 5 % in electrolyte-A (PLASMALYTE-A) 1,000 mL infusion   Intravenous Continuous    diatrizoate meglumine & sodium oral solution  8 mL Oral Give in Radiology    docusate sodium (COLACE) capsule  100 mg Oral 2x/day    elvitegravir-cobicistat-emtricitrabine-tenofovir (GENVOYA) 150-150-200-10 mg per tablet  1 Tablet Oral Daily    gabapentin (NEURONTIN) capsule  300 mg Oral 3x/day    heparin 25,000 units in NS 250 mL infusion  12 Units/kg/hr (Adjusted) Intravenous Continuous    HYDROmorphone (DILAUDID) 1 mg/mL injection  0.2 mg Intravenous Q3H PRN    iopamidol (ISOVUE-370) 76% infusion  100 mL Intravenous Give in Radiology    levothyroxine (SYNTHROID) tablet  88 mcg Oral QAM  methocarbamol (ROBAXIN) tablet  500 mg Oral 4x/day    metroNIDAZOLE (FLAGYL) tablet  500 mg Oral 2x/day    NS 250 mL flush bag   Intravenous Q15 Min PRN    NS flush syringe  2-6 mL Intracatheter Q8HRS    NS flush syringe  2-6 mL Intracatheter Q1 MIN PRN    NS flush syringe  10-30 mL Intracatheter Q8HRS    NS flush syringe  20-30 mL Intracatheter Q1 MIN PRN    ondansetron (ZOFRAN) 2 mg/mL injection  4 mg Intravenous Q6H PRN    oxyCODONE (ROXICODONE) immediate release tablet  5 mg Oral Q4H  PRN    oxyCODONE (ROXICODONE) immediate releaste tablet  10 mg Oral Q4H PRN    pantoprazole (PROTONIX) delayed release tablet  40 mg Oral Daily before Breakfast    sennosides-docusate sodium (SENOKOT-S) 8.6-'50mg'$  per tablet  1 Tablet Oral 2x/day    SSIP insulin R human (HUMULIN R) 100 units/mL injection  0-12 Units Subcutaneous Q6H PRN    vancomycin (VANCOCIN) 1 g in D5W 200 mL premix IVPB  15 mg/kg (Adjusted) Intravenous Q12H    Vancomycin IV - Pharmacist to Dose per Protocol   Does not apply Daily PRN         [name], MD, 08:03  04/12/2021  PGY-2 General Surgery    I saw and examined this patient.  Please see the resident's note, which I have carefully reviewed, for full details. I agree with the findings and plan of care as documented in the note.  Any additions/exceptions are edited/noted.    Working on nutrition    Lorriane Shire, MD 04/12/2021 15:20  Assistant Professor of Surgery  Division of [facility]

## 2021-04-12 NOTE — Pharmacy (Signed)
[place] / Department of Pharmaceutical Services  Therapeutic Drug Monitoring: Vancomycin  04/12/2021      Patient name: [name], [name]  Date of Birth:  1945/07/10    Actual Weight:  Weight: 88.3 kg (194 lb 10.7 oz) (04/12/21 0000)     BMI:  BMI (Calculated): 28.81 (04/12/21 0000)      Date RPh Current regimen (including mg/kg) Indication &  Organism AUC or trough based dosing Target Levels^ SCr (mg/dL) CrCl* (mL/min) Infectious Laboratory Markers (as applicable)   Measured level(s)   (mcg/mL) Calculated AUC (if AUC based monitoring) Plan & predicted AUC/trough if initial dosing (including when levels are due) Comments   3/9 [name] Initiating Intra-abdominal AUC 400-600 0.69 92.5 WBC: 35.5     - Initiate vancomycin 1g q12h (~13 mg/kg)   - predicted AUC 453  - levels in 48 hrs if continued  - MRSA swab ordered in case of concern for pneumonia    03/11 1154 [name] Vancomycin 1gm q12h IAB/bacteremia AUC  0.8 ~92 34.0 Pk 20.9 (ext to 22.5)  Tr 12.0 (ext to 11.95) 402 -AUC in low end of acceptable range  -SCR has increased slightly so will continue same dose for now to avoid accummulation - cefepime increased to q8h per ID rec-growing PSA in blood  -monitor renal closely  +flagyl   3/13 [name]          - Vancomycin discontinued, switched to daptomycin                                                                    ^Target levels depends on dosing and monitoring method, AUC vs. trough based. For AUC based dosing units are mg*h/L. For trough based dosing units are mcg/mL.     *Creatinine clearance is estimated by using the Cockcroft-Gault equation for adult patients and the Carol Ada for pediatric patients.    The decision to discontinue vancomycin therapy will be determined by the primary service.  Please contact the pharmacist with any questions regarding this patient's medication regimen.

## 2021-04-13 DIAGNOSIS — K632 Fistula of intestine: Secondary | ICD-10-CM

## 2021-04-13 LAB — WOUND, SUPERFICIAL/NON-STERILE SITE, AEROBIC CULTURE AND GRAM STAIN

## 2021-04-13 LAB — H & H
HCT: 25.9 % — ABNORMAL LOW (ref 38.9–52.0)
HGB: 8.4 g/dL — ABNORMAL LOW (ref 13.4–17.5)

## 2021-04-13 LAB — PTT (PARTIAL THROMBOPLASTIN TIME)
APTT: 67.8 seconds — ABNORMAL HIGH (ref 24.2–37.5)
APTT: 54.5 seconds — ABNORMAL HIGH (ref 24.2–37.5)
APTT: 54.4 seconds — ABNORMAL HIGH (ref 24.2–37.5)

## 2021-04-13 LAB — BASIC METABOLIC PANEL
ANION GAP: 5 mmol/L (ref 4–13)
BUN/CREA RATIO: 15 (ref 6–22)
BUN: 10 mg/dL (ref 8–25)
CALCIUM: 7.8 mg/dL — ABNORMAL LOW (ref 8.8–10.2)
CHLORIDE: 100 mmol/L (ref 96–111)
CO2 TOTAL: 25 mmol/L (ref 23–31)
CREATININE: 0.67 mg/dL — ABNORMAL LOW (ref 0.75–1.35)
ESTIMATED GFR: >90 mL/min/BSA (ref >=60)
GLUCOSE: 138 mg/dL — ABNORMAL HIGH (ref 65–125)
POTASSIUM: 4.1 mmol/L (ref 3.5–5.1)
SODIUM: 130 mmol/L — ABNORMAL LOW (ref 136–145)

## 2021-04-13 LAB — CBC
HCT: 26.2 % — ABNORMAL LOW (ref 38.9–52.0)
HGB: 8.3 g/dL — ABNORMAL LOW (ref 13.4–17.5)
MCH: 28.9 pg (ref 26.0–32.0)
MCHC: 31.7 g/dL (ref 31.0–35.5)
MCV: 91.3 fL (ref 78.0–100.0)
MPV: 11.9 fL (ref 8.7–12.5)
PLATELETS: 657 10*3/uL — ABNORMAL HIGH (ref 150–400)
RBC: 2.87 10*6/uL — ABNORMAL LOW (ref 4.50–6.10)
RDW-CV: 15.8 % — ABNORMAL HIGH (ref 11.5–15.5)
WBC: 33.6 10*3/uL — ABNORMAL HIGH (ref 3.7–11.0)

## 2021-04-13 LAB — PHOSPHORUS: PHOSPHORUS: 1.8 mg/dL — ABNORMAL LOW (ref 2.3–4.0)

## 2021-04-13 LAB — MAGNESIUM: MAGNESIUM: 1.9 mg/dL (ref 1.8–2.6)

## 2021-04-13 LAB — POC BLOOD GLUCOSE (RESULTS)
GLUCOSE, POC: 128 mg/dl — ABNORMAL HIGH (ref 70–105)
GLUCOSE, POC: 180 mg/dl — ABNORMAL HIGH (ref 70–105)
GLUCOSE, POC: 184 mg/dl — ABNORMAL HIGH (ref 70–105)
GLUCOSE, POC: 196 mg/dl — ABNORMAL HIGH (ref 70–105)
GLUCOSE, POC: 204 mg/dl — ABNORMAL HIGH (ref 70–105)

## 2021-04-13 MED ORDER — MULTIVIT-MIN-FOLIC ACID 0.4 MG-LYCOPENE 300 MCG-LUTEIN 250 MCG TABLET
1.0000 | ORAL_TABLET | ORAL | Status: DC
Start: 2021-04-13 — End: 2021-05-28
  Administered 2021-04-13 – 2021-04-20 (×8): 1 via ORAL
  Administered 2021-04-21 – 2021-04-24 (×4): 0 via ORAL
  Administered 2021-04-25 – 2021-05-01 (×7): 1 via ORAL
  Administered 2021-05-02: 0 via ORAL
  Administered 2021-05-03 – 2021-05-04 (×2): 1 via ORAL
  Administered 2021-05-05 – 2021-05-06 (×2): 0 via ORAL
  Administered 2021-05-07 – 2021-05-12 (×6): 1 via ORAL
  Administered 2021-05-13 – 2021-05-14 (×2): 0 via ORAL
  Administered 2021-05-15 – 2021-05-19 (×5): 1 via ORAL
  Administered 2021-05-20: 0 via ORAL
  Administered 2021-05-21: 1 via ORAL
  Administered 2021-05-22: 0 via ORAL
  Administered 2021-05-23 – 2021-05-28 (×6): 1 via ORAL
  Filled 2021-04-13 (×41): qty 1

## 2021-04-13 MED ORDER — WATER FOR INJECTION, STERILE INTRAVENOUS SOLUTION
INTRAVENOUS | Status: AC
Start: 2021-04-13 — End: 2021-04-14
  Filled 2021-04-13: qty 500

## 2021-04-13 MED ORDER — THIAMINE HCL (VITAMIN B1) 100 MG/ML INJECTION SOLUTION
200.0000 mg | Freq: Once | INTRAMUSCULAR | Status: AC
Start: 2021-04-13 — End: 2021-04-13
  Administered 2021-04-13: 200 mg via INTRAVENOUS
  Administered 2021-04-13: 0 mg via INTRAVENOUS
  Filled 2021-04-13: qty 2

## 2021-04-13 MED ORDER — HEPARIN 25,000 UNIT/250 ML (100 UNIT/ML) IN NS IV SOLN
18.0000 [IU]/kg/h | INTRAVENOUS | Status: DC
Start: 2021-04-13 — End: 2021-04-19
  Administered 2021-04-13: 17 [IU]/kg/h via INTRAVENOUS
  Administered 2021-04-13 (×2): 18 [IU]/kg/h via INTRAVENOUS
  Administered 2021-04-14 (×5): 17 [IU]/kg/h via INTRAVENOUS
  Administered 2021-04-15: 0 [IU]/kg/h via INTRAVENOUS
  Filled 2021-04-13 (×2): qty 250

## 2021-04-13 MED ORDER — SODIUM PHOSPHATE 3 MMOL/ML INTRAVENOUS SOLUTION
20.0000 mmol | Freq: Once | INTRAVENOUS | Status: AC
Start: 2021-04-13 — End: 2021-04-13
  Administered 2021-04-13: 20 mmol via INTRAVENOUS
  Administered 2021-04-13: 0 mmol via INTRAVENOUS
  Filled 2021-04-13: qty 6.67

## 2021-04-13 NOTE — Progress Notes (Signed)
[facility]  Surgical Oncology  Progress Note      [place], [place], 76 y.o. male  Date of Birth:  [birth year]  Date of Admission:  04/03/2021  Date of service: 04/13/2021    Assessment:  This is a 76 y.o. 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 He underwent massive transfusion protocol and ex lap on 04/03/21 at an outside facility with ligation of a bleeding vessel in the liver bed. Patient was subsequently transferred to [facility] postoperaively, intubated. Patient was subsequently extubated later that day on 3/4. 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       Plan/Recommendations:  - Repeat H+H this afternoon  - Diet: Regular diet   - Aspiration precautions - HOB 30  - Patient underwent drain placement & emobolization with IR on 3/10         - JP continues to drain pancreatic fluid               -- Amylase level > 6,554 04/06/21 & 04/08/21 and 04/10/21, repeat amylase tomorrow   -Leukocytosis   -CT C/A/P                  -- Peripancreatic fluid collections                  -- IR consulted; drain placement 3/10- now with bloody output again- hold hep gtt              - Blood cultures 3/9: + pseudomonas    - Cultures form R JP: Pseudomonas and enterococcus              - Repeat Blood cultures today   - Continue TPN   - Continue Vanc, Cefepime, flagyl               - ID consulted   - PE   - Continue low intensity, no bolus heparin drip   - Increase heparin drip to 18 u/kg              - Trend H&H    - Encouraged IS and ambulation   - Aggressive plum toilet   - Wean O2   - C/f for midline fistula        - increased brown drainage from midline incision        - ostomy bag added to monitor output       - Continue to monitor     - OOB TID  - PT/OT ordered    Subjective: Complains of some mild RUQ pain. Denies any fevers or  chills. Still small amount of feculent drainage from midline incision this AM. Denies any nausea/vomiting.     Objective  Filed Vitals:    04/13/21 0700 04/13/21 0800 04/13/21 0834 04/13/21 1000   BP:  121/67  120/60   Pulse: 94 95 87 90   Resp: 19 18 (!) 9 12   Temp:   36.7 C (98.1 F)    SpO2: 94% 97% 96% 96%     Physical Exam:   GEN:  AOx4, resting in bed, no acute distress  PULM: Normal respiratory effort.   CV:  Tachycardic, regular rhythm   ABD:   Abdomen soft, distended, TTP RUQ. JP in place in RUQ with dark pancreatic fluid,  Lower midline incision with brown drainage present with ostomy bag over it.  Pigtail catheter present with bloody output.   MS: Atraumatic.  Distal pulses intact.  Normal strength and range of motion of all extremities.    NEURO:   Alert and oriented to person, place and time.  Cranial nerves grossly intact.    Vascular:  All pulses palpable and equal bilaterally  Integumentary:  Pink, warm, and dry  PSYCHOSOCIAL: Pleasant.  Normal affect.     Labs  Results for orders placed or performed during the hospital encounter of 04/03/21 (from the past 24 hour(s))   H & H   Result Value Ref Range    HGB 8.2 (L) 13.4 - 17.5 g/dL    HCT 25.2 (L) 38.9 - 52.0 %   PTT (PARTIAL THROMBOPLASTIN TIME)   Result Value Ref Range    APTT 73.7 (H) 24.2 - 37.5 seconds    Narrative    Therapeutic range for unfractionated heparin is 60-100 seconds.   H & H   Result Value Ref Range    HGB 8.4 (L) 13.4 - 17.5 g/dL    HCT 26.3 (L) 38.9 - 52.0 %   PTT (PARTIAL THROMBOPLASTIN TIME)   Result Value Ref Range    APTT 79.4 (H) 24.2 - 37.5 seconds    Narrative    Therapeutic range for unfractionated heparin is 60-100 seconds.   BASIC METABOLIC PANEL   Result Value Ref Range    SODIUM 130 (L) 136 - 145 mmol/L    POTASSIUM 4.1 3.5 - 5.1 mmol/L    CHLORIDE 100 96 - 111 mmol/L    CO2 TOTAL 25 23 - 31 mmol/L    ANION GAP 5 4 - 13 mmol/L    CALCIUM 7.8 (L) 8.8 - 10.2 mg/dL    GLUCOSE 138 (H) 65 - 125 mg/dL    BUN 10 8 - 25  mg/dL    CREATININE 0.67 (L) 0.75 - 1.35 mg/dL    BUN/CREA RATIO 15 6 - 22    ESTIMATED GFR >90 >=60 mL/min/BSA   CBC   Result Value Ref Range    WBC 33.6 (H) 3.7 - 11.0 x103/uL    RBC 2.87 (L) 4.50 - 6.10 x106/uL    HGB 8.3 (L) 13.4 - 17.5 g/dL    HCT 26.2 (L) 38.9 - 52.0 %    MCV 91.3 78.0 - 100.0 fL    MCH 28.9 26.0 - 32.0 pg    MCHC 31.7 31.0 - 35.5 g/dL    RDW-CV 15.8 (H) 11.5 - 15.5 %    PLATELETS 657 (H) 150 - 400 x103/uL    MPV 11.9 8.7 - 12.5 fL   MAGNESIUM   Result Value Ref Range    MAGNESIUM 1.9 1.8 - 2.6 mg/dL   PHOSPHORUS   Result Value Ref Range    PHOSPHORUS 1.8 (L) 2.3 - 4.0 mg/dL   PTT (PARTIAL THROMBOPLASTIN TIME) - ONCE   Result Value Ref Range    APTT 67.8 (H) 24.2 - 37.5 seconds    Narrative    Therapeutic range for unfractionated heparin is 60-100 seconds.   POC BLOOD GLUCOSE (RESULTS)   Result Value Ref Range    GLUCOSE, POC 230 (H) 70 - 105 mg/dl   POC BLOOD GLUCOSE (RESULTS)   Result Value Ref Range    GLUCOSE, POC 128 (H) 70 - 105 mg/dl   POC BLOOD GLUCOSE (RESULTS)   Result Value Ref Range    GLUCOSE, POC 128 (  H) 70 - 105 mg/dl   POC BLOOD GLUCOSE (RESULTS)   Result Value Ref Range    GLUCOSE, POC 180 (H) 70 - 105 mg/dl       I/O:  Date 04/12/21 0700 - 04/13/21 0659 04/13/21 0700 - 04/14/21 0659   Shift 2671-2458 1500-2259 2300-0659 24 Hour Total 0998-3382 1500-2259 2300-0659 24 Hour Total   INTAKE   P.O.  20  20         Oral  20  20       I.V.(mL/kg/hr) 562.8(0.8) 505.39(7.67) 3419.37(9.02) 2165.93(1.07) 615.5   615.5     Med (IV) Flush Volume  20 40 60         Heparin Volume 112.8 113.81 106.07 332.68 65.5   65.5     Volume Infused (dextrose 5 % in electrolyte-A (PLASMALYTE-A) 1,000 mL infusion) 450 75  525         Volume Infused (adult custom parenteral nutrition)  15.75 280 295.75 175   175     Volume Infused (electrolyte-A (PLASMALYTE-A) premix infusion)  352.5 600 952.5 375   375   Other 10 0 0 10 0   0     Drain Input (Drain (Miscellaneous) #10F pigtail catheter for RLQ fluid  collection Lower;Right;Anterior Abdomen) 10 0 0 10 0   0   IV Piggyback  186 120 306 120   120     Volume (meropenem (MERREM) 1 g in NS 100 mL IVPB)  120 120 240 120   120     Volume (DAPTOmycin (CUBICIN) 800 mg in NS 50 mL IVPB)  66  66       Shift Total(mL/kg) 572.8(6.49) 409.73(5.32) 9924.26(83.41) 9622.29(79.89) 735.5(8.71)   735.5(8.71)   OUTPUT   Urine(mL/kg/hr) 1065(1.51) 425(0.6) 1250(1.85) 2740(1.35) 550   550     Urine (Voided) 1065 425 1250 2740 550   550     Urine Occurrence 3 x 1 x  4 x       Drains 335 350 260 945 225   225     JP Drain Output ([place] Drain Right;Upper Abdomen) '15 10 30 '$ 55 0   0     Drain Output (Drain (Miscellaneous) #10F pigtail catheter for RLQ fluid collection Lower;Right;Anterior Abdomen) 320 340 230 890 225   225   Stool             Stool Occurrence 2 x  1 x 3 x       Shift Total(mL/kg) 2119(41.74) 775(8.78) 0814(48.18) 5631(49.70) 775(9.18)   775(9.18)   Weight (kg) 88.3 88.3 84.4 84.4 84.4 84.4 84.4 84.4       Radiology       Current Medications:  Current Facility-Administered Medications   Medication Dose Route Frequency    acetaminophen (TYLENOL) tablet  650 mg Oral Q4H    adult custom parenteral nutrition   Intravenous Continuous    atorvastatin (LIPITOR) tablet  40 mg Oral Daily with Breakfast    benzonatate (TESSALON) capsule  100 mg Oral Q8H PRN    D5W 250 mL flush bag   Intravenous Q15 Min PRN    DAPTOmycin (CUBICIN) 800 mg in NS 50 mL IVPB  10 mg/kg (Adjusted) Intravenous Q24H    docusate sodium (COLACE) capsule  100 mg Oral 2x/day    electrolyte-A (PLASMALYTE-A) premix infusion   Intravenous Continuous    elvitegravir-cobicistat-emtricitrabine-tenofovir (GENVOYA) 150-150-200-10 mg per tablet  1 Tablet Oral Daily    gabapentin (NEURONTIN) capsule  300 mg Oral 3x/day  heparin 25,000 units in NS 250 mL infusion  18 Units/kg/hr (Adjusted) Intravenous Continuous    HYDROmorphone (DILAUDID) 1 mg/mL injection  0.2 mg Intravenous Q3H PRN    iopamidol (ISOVUE-370) 76%  infusion  100 mL Intravenous Give in Radiology    meropenem (MERREM) 1 g in NS 100 mL IVPB  1 g Intravenous Q8H    methocarbamol (ROBAXIN) tablet  500 mg Oral 4x/day    NS 250 mL flush bag   Intravenous Q15 Min PRN    NS flush syringe  2-6 mL Intracatheter Q8HRS    NS flush syringe  2-6 mL Intracatheter Q1 MIN PRN    NS flush syringe  10-30 mL Intracatheter Q8HRS    NS flush syringe  20-30 mL Intracatheter Q1 MIN PRN    NS flush syringe  10-30 mL Intracatheter Q8HRS    NS flush syringe  20-30 mL Intracatheter Q1 MIN PRN    ondansetron (ZOFRAN) 2 mg/mL injection  4 mg Intravenous Q6H PRN    oxyCODONE (ROXICODONE) immediate release tablet  5 mg Oral Q4H PRN    oxyCODONE (ROXICODONE) immediate releaste tablet  10 mg Oral Q4H PRN    pantoprazole (PROTONIX) injection  40 mg Intravenous Daily    sennosides-docusate sodium (SENOKOT-S) 8.6-'50mg'$  per tablet  1 Tablet Oral 2x/day    SSIP insulin R human (HUMULIN R) 100 units/mL injection  0-12 Units Subcutaneous Q6H PRN         [name], MD, 11:47  04/13/2021  PGY-2 General Surgery    I saw and examined this patient.  Please see the resident's note, which I have carefully reviewed, for full details. I agree with the findings and plan of care as documented in the note.  Any additions/exceptions are edited/noted.    Lorriane Shire, MD 04/16/2021 15:12  Assistant Professor of Surgery  Division of [facility]

## 2021-04-13 NOTE — Anesthesia Postprocedure Evaluation (Signed)
Anesthesia Post Op Evaluation    Patient: [name]  Procedure(s) with comments:  DISTIAL PANCREATECTOMY - DIAGNOSTIC LAPARSCOPY  SPLENECTOMY    Last Vitals:Temperature: 36.6 C (97.9 F) (03/25/21 1111)  Heart Rate: 95 (03/25/21 1111)  BP (Non-Invasive): 136/82 (03/25/21 1111)  Respiratory Rate: 18 (03/25/21 1111)  SpO2: 91 % (03/25/21 1111)    No notable events documented.    Patient is sufficiently recovered from the effects of anesthesia to participate in the evaluation and has returned to their pre-procedure level.  Patient location during evaluation: PACU       Patient participation: complete - patient participated  Level of consciousness: awake and alert and responsive to verbal stimuli    Pain management: adequate  Airway patency: patent    Anesthetic complications: no  Cardiovascular status: acceptable  Respiratory status: acceptable  Hydration status: acceptable  Patient post-procedure temperature: Pt Normothermic   PONV Status: Absent

## 2021-04-13 NOTE — Care Plan (Signed)
Problem: Adult Inpatient Plan of Care  Goal: Plan of Care Review  Outcome: Ongoing (see interventions/notes)  Goal: Patient-Specific Goal (Individualized)  Outcome: Ongoing (see interventions/notes)  Goal: Absence of Hospital-Acquired Illness or Injury  Outcome: Ongoing (see interventions/notes)  Intervention: Prevent Skin Injury  Recent Flowsheet Documentation  Taken 04/13/2021 1800 by Shan Levans, RN  Skin Protection:   adhesive use limited   antimicrobial wipes   incontinence pads utilized   skin-to-device areas padded   skin-to-skin areas padded   transparent dressing maintained   tubing/devices free from skin contact  Intervention: Prevent and Manage VTE (Venous Thromboembolism) Risk  Recent Flowsheet Documentation  Taken 04/13/2021 1800 by Shan Levans, RN  VTE Prevention/Management: anticoagulant therapy maintained  Goal: Optimal Comfort and Wellbeing  Outcome: Ongoing (see interventions/notes)  Intervention: Provide Person-Centered Care  Recent Flowsheet Documentation  Taken 04/13/2021 1800 by Shan Levans, [place] Relationship/Rapport:   care explained   choices provided   emotional support provided  Goal: Rounds/Family Conference  Outcome: Ongoing (see interventions/notes)     Problem: Mechanical Ventilation Invasive  Goal: Effective Communication  Outcome: Ongoing (see interventions/notes)  Intervention: Ensure Effective Communication  Recent Flowsheet Documentation  Taken 04/13/2021 1800 by Shan Levans, [place] Relationship/Rapport:   care explained   choices provided   emotional support provided  Diversional Activities:   television   smartphone  [place]: self-care encouraged  Goal: Optimal Device Function  Outcome: Ongoing (see interventions/notes)  Goal: Mechanical Ventilation Liberation  Outcome: Ongoing (see interventions/notes)  Goal: Optimal Nutrition Delivery  Outcome: Ongoing (see interventions/notes)  Goal: Absence of Device-Related Skin and Tissue  Injury  Outcome: Ongoing (see interventions/notes)  Intervention: Maintain Skin and Tissue Health  Recent Flowsheet Documentation  Taken 04/13/2021 1800 by Shan Levans, RN  Device Skin Pressure Protection: absorbent pad utilized/changed  Goal: Absence of Ventilator-Induced Lung Injury  Outcome: Ongoing (see interventions/notes)     Problem: Fall Injury Risk  Goal: Absence of Fall and Fall-Related Injury  Outcome: Ongoing (see interventions/notes)     Problem: Skin Injury Risk Increased  Goal: Skin Health and Integrity  Outcome: Ongoing (see interventions/notes)  Intervention: Optimize Skin Protection  Recent Flowsheet Documentation  Taken 04/13/2021 1800 by Shan Levans, RN  Pressure Reduction Techniques:   frequent weight shift encouraged   heels elevated off bed  Pressure Reduction Devices: (M,H,VH) Use Repositioning Devices or Pillows  Skin Protection:   adhesive use limited   antimicrobial wipes   incontinence pads utilized   skin-to-device areas padded   skin-to-skin areas padded   transparent dressing maintained   tubing/devices free from skin contact     Problem: Parenteral Nutrition  Goal: Effective Intravenous Nutrition Therapy Delivery  Outcome: Ongoing (see interventions/notes)

## 2021-04-13 NOTE — Nurses Notes (Signed)
Patient arrived to 814 via bed with ICU nurse for transfer. Heparin and TPN signed off in MAR. Patient is alert and oriented X4. Vital signs stable and dual skin assessment done with charge nurse [place]. Safety precautions in place and all belongings including call bell within reach.     Shan Levans, RN

## 2021-04-13 NOTE — Care Plan (Signed)
[facility]  Physical Therapy Initial Evaluation    Patient Name: [name]  Date of Birth: [birth year]  Height: Height: 175.3 cm ('5\' 9"'$ )  Weight: Weight: 84.4 kg (186 lb 1.1 oz)  Room/Bed: 14/A  Payor: MEDICARE / Plan: MEDICARE PART A AND B / Product Type: Medicare /     Assessment:      PT evaluation complete, pt tolerating well, demonstrating bed mobility to EOB with instruction in log roll technique to decrease abdominal strain. Pt requiring min A to EOB, tolerates 3-4 minutes EOB completing UE and LE AROM. Anticipate D/C to inpatient rehab when medically appropriate.    Discharge Needs:    Equipment Recommendation: TBD    Discharge Disposition: inpatient rehabilitation facility    JUSTIFICATION OF DISCHARGE RECOMMENDATION   Based on current diagnosis, functional performance prior to admission, and current functional performance, this patient requires continued PT services in inpatient rehabilitation facility in order to achieve significant functional improvements in these deficit areas: aerobic capacity/endurance, gait, locomotion, and balance, muscle performance.    Plan:   Current Intervention: balance training, bed mobility training, gait training, stair training, strengthening, stretching, transfer training  To provide physical therapy services minimum of 3x/week  for duration of until discharge.    The risks/benefits of therapy have been discussed with the patient/caregiver and he/she is in agreement with the established plan of care.       Subjective & Objective        04/13/21 1526   Therapist Pager   PT Assigned/ Pager # Einar [place] [phone]   Rehab Session   Document Type re-evaluation   Total PT Minutes: 26   Patient Effort good   Symptoms Noted During/After Treatment none   General Information   Patient Profile Reviewed yes   Onset of Illness/Injury or Date of Surgery 04/03/21   Pertinent History of Current Functional Problem 76 y.o. 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 He underwent massive transfusion protocol and ex lap on 04/03/21 with ligation of a bleeding vessel   Medical Lines PIV Line;Telemetry   Respiratory Status nasal cannula   Existing Precautions/Restrictions full code;fall precautions   Mutuality/Individual Preferences   Individualized Care Needs OOB with Ax2   Living Environment   Lives With spouse   Living Arrangements house   Home Assessment: No Problems Identified   Home Accessibility no concerns   Home Main Entrance   Number of Stairs, Main Entrance two   Stair Railings, Main Entrance railing on left side (ascending)   Functional Level Prior   Ambulation 0 - independent   Transferring 0 - independent   Toileting 0 - independent   Bathing 0 - independent   Dressing 0 - independent   Eating 0 - independent   Self-Care   Equipment Currently Used at Home other (see comments)   Equipment Currently Used at Home walker, front wheeled;cane, straight;wheelchair   Pre Treatment Status   Pre Treatment Patient Status Patient supine in bed;Call light within reach;Telephone within reach;Sitter select activated   Support Present Pre Treatment  None   Communication Pre Treatment  Nurse   Cognitive Assessment/Interventions   Behavior/Mood Observations behavior appropriate to situation, WNL/WFL   Orientation Status oriented x 4   Attention WNL/WFL   Follows Commands WNL   Vital Signs  Pre SpO2 (%) 96   O2 Delivery Pre Treatment room air   Post SpO2 (%) 97   O2 Delivery Post Treatment room air   Pain Assessment   Pre/Posttreatment Pain Comment no c/o pain, tightness in abdomen   RUE Assessment   RUE Assessment X- Exceptions   RUE ROM WFL   RUE Strength Grossly 4+/5 except triceps 3+/5   LUE Assessment   LUE Assessment WFL- Within Functional Limits   LUE ROM WFL   LUE Strength Grossly 4+/5  except triceps 3+/5   RLE Assessment   RLE Assessment X-Exceptions   RLE ROM WFL   RLE Strength Grossly 4+/5 except 3+/5 ankle DF   LLE Assessment   LLE Assessment X-Exceptions   LLE ROM WFL.   LLE Strength Grossly 4/5 except ankle DF 3+/5   Bed Mobility Assessment/Treatment   Bed Mobility, Assistive Device Head of Bed Elevated   Supine-Sit Independence contact guard assist;verbal cues required   Impairments balance impaired;endurance;strength decreased   Comment Pt sat EOB x 3-4 mins limited by fatigue   Balance Skill Training   Sitting Balance: Static fair + balance   Sitting, Dynamic (Balance) fair balance   Systems Impairment Contributing to Balance Disturbance musculoskeletal   Therapeutic Exercise/Activity   Comment Pt instructed in UE and LE TE sitting EOB with marching and alternating shoulder flexion. 1x10   Post Treatment Status   Post Treatment Patient Status Patient supine in bed;Call light within reach;Telephone within reach;Consulting civil engineer Treatment Comment pt performance   Plan of Care Review   Plan Of Care Reviewed With patient   Basic Mobility Am-PAC/6Clicks Score (APPROVED PT Staff, WHL, Bath, Letts, Herrick, and FMT)   Turning in bed without bedrails 4   Lying on back to sitting on edge of flat bed 3   Moving to and from a bed to a chair 3   Standing up from chair 2   Walk in room 2   Climbing 3-5 steps with railing 1   6 Clicks Raw Score total 15   Standardized (t-scale) score 36.97   CMS 0-100% Score 50.4   CMS Modifier CK   Patient Mobility Goal (JHHLM) 3- Sit at edge of bed 3X/day   Exercise/Activity Level Performed 3- Sat at edge of bed   Physical Therapy Clinical Impression   Assessment PT evaluation complete, pt tolerating well, demonstrating bed mobility to EOB with instruction in log roll technique to decrease abdominal strain. Pt requiring min A to EOB, tolerates 3-4 minutes  EOB completing UE and LE AROM. Anticipate D/C to inpatient rehab when medically appropriate.   Criteria for Skilled Therapeutic yes   Pathology/Pathophysiology Noted musculoskeletal   Impairments Found (describe specific impairments) aerobic capacity/endurance;gait, locomotion, and balance;muscle performance   Functional Limitations in Following  self-care;home management;community/leisure   Disability: Inability to Perform community/leisure   Rehab Potential good   Therapy Frequency minimum of 3x/week   Predicted Duration of Therapy Intervention (days/wks) until discharge   Anticipated Equipment Needs at Discharge (PT) TBD   Anticipated Discharge Disposition inpatient rehabilitation facility   Evaluation Complexity Justification   Patient History: Co-morbidity/factors that impact Plan of Care 3 or more that impact Plan of Care   Examination Components 3 or more Exam elements addressed   Presentation Evolving: Symptoms, complaints, characteristics of condition changing &/or cognitive deficits present   Clinical Decision Making Moderate complexity  Evaluation Complexity Moderate complexity   Care Plan Goals   PT Rehab Goals Transfer Training Goal;Gait Training Goal;Bed Mobility Goal   Bed Mobility Goal   Bed Mobility Goal, Date Established 04/13/21   Bed Mobility Goal, Time to Achieve by discharge   Bed Mobility Goal, Activity Type all bed mobility activities   Bed Mobility Goal, Independence Level modified independence   Bed Mobility Goal, Assistive Device least restrictive assistive device   Gait Training  Goal, Distance to Achieve   Gait Training  Goal, Date Established 04/13/21   Gait Training  Goal, Time to Achieve by discharge   Gait Training  Goal, Independence Level modified independence   Gait Training  Goal, Assist Device least restricted assistive device   Gait Training  Goal, Distance to Achieve 150   Transfer Training Goal   Transfer Training Goal, Date Established 04/13/21   Transfer Training Goal,  Time to Achieve by discharge   Transfer Training Goal, Activity Type all transfers   Transfer Training Goal, Independence Level modified independence   Transfer Training Goal, Assist Device least restrictrictive assistive device   Planned Therapy Interventions, PT Eval   Planned Therapy Interventions (PT) balance training;bed mobility training;gait training;stair training;strengthening;stretching;transfer training       Therapist:   Theodosia Blender, PT   Pager #: [phone]

## 2021-04-14 ENCOUNTER — Ambulatory Visit (INDEPENDENT_AMBULATORY_CARE_PROVIDER_SITE_OTHER): Payer: Self-pay | Admitting: Gastroenterology

## 2021-04-14 ENCOUNTER — Encounter (INDEPENDENT_AMBULATORY_CARE_PROVIDER_SITE_OTHER): Payer: Self-pay | Admitting: SURGICAL ONCOLOGY

## 2021-04-14 LAB — HEPATIC FUNCTION PANEL
ALBUMIN: 1.3 g/dL — ABNORMAL LOW (ref 3.4–4.8)
ALKALINE PHOSPHATASE: 143 U/L — ABNORMAL HIGH (ref 45–115)
ALT (SGPT): 12 U/L (ref 10–55)
AST (SGOT): 18 U/L (ref 8–45)
BILIRUBIN DIRECT: 0.1 mg/dL (ref 0.1–0.4)
BILIRUBIN TOTAL: 0.2 mg/dL — ABNORMAL LOW (ref 0.3–1.3)
PROTEIN TOTAL: 4.5 g/dL — ABNORMAL LOW (ref 6.0–8.0)

## 2021-04-14 LAB — BASIC METABOLIC PANEL
ANION GAP: 5 mmol/L (ref 4–13)
BUN/CREA RATIO: 17 (ref 6–22)
BUN: 11 mg/dL (ref 8–25)
CALCIUM: 7.7 mg/dL — ABNORMAL LOW (ref 8.8–10.2)
CHLORIDE: 102 mmol/L (ref 96–111)
CO2 TOTAL: 25 mmol/L (ref 23–31)
CREATININE: 0.63 mg/dL — ABNORMAL LOW (ref 0.75–1.35)
ESTIMATED GFR: >90 mL/min/BSA (ref >=60)
GLUCOSE: 170 mg/dL — ABNORMAL HIGH (ref 65–125)
POTASSIUM: 4 mmol/L (ref 3.5–5.1)
SODIUM: 132 mmol/L — ABNORMAL LOW (ref 136–145)

## 2021-04-14 LAB — CBC
HCT: 24 % — ABNORMAL LOW (ref 38.9–52.0)
HGB: 7.4 g/dL — ABNORMAL LOW (ref 13.4–17.5)
MCH: 28.6 pg (ref 26.0–32.0)
MCHC: 30.8 g/dL — ABNORMAL LOW (ref 31.0–35.5)
MCV: 92.7 fL (ref 78.0–100.0)
MPV: 12 fL (ref 8.7–12.5)
PLATELETS: 716 10*3/uL — ABNORMAL HIGH (ref 150–400)
RBC: 2.59 10*6/uL — ABNORMAL LOW (ref 4.50–6.10)
RDW-CV: 15.9 % — ABNORMAL HIGH (ref 11.5–15.5)
WBC: 25.8 10*3/uL — ABNORMAL HIGH (ref 3.7–11.0)

## 2021-04-14 LAB — MAGNESIUM: MAGNESIUM: 1.8 mg/dL (ref 1.8–2.6)

## 2021-04-14 LAB — H & H
HCT: 24.7 % — ABNORMAL LOW (ref 38.9–52.0)
HGB: 7.9 g/dL — ABNORMAL LOW (ref 13.4–17.5)

## 2021-04-14 LAB — PTT (PARTIAL THROMBOPLASTIN TIME)
APTT: 54.9 seconds — ABNORMAL HIGH (ref 24.2–37.5)
APTT: 60.1 seconds — ABNORMAL HIGH (ref 24.2–37.5)

## 2021-04-14 LAB — POC BLOOD GLUCOSE (RESULTS)
GLUCOSE, POC: 181 mg/dl — ABNORMAL HIGH (ref 70–105)
GLUCOSE, POC: 188 mg/dl — ABNORMAL HIGH (ref 70–105)
GLUCOSE, POC: 249 mg/dl — ABNORMAL HIGH (ref 70–105)
GLUCOSE, POC: 199 mg/dl — ABNORMAL HIGH (ref 70–105)

## 2021-04-14 LAB — ADULT ROUTINE BLOOD CULTURE, SET OF 2 BOTTLES (BACTERIA AND YEAST)
BLOOD CULTURE, ROUTINE: ABNORMAL — CR
BLOOD CULTURE, ROUTINE: NO GROWTH
BLOOD CULTURE, ROUTINE: NO GROWTH

## 2021-04-14 LAB — PHOSPHORUS: PHOSPHORUS: 1.7 mg/dL — ABNORMAL LOW (ref 2.3–4.0)

## 2021-04-14 LAB — TRIGLYCERIDE BODY FLUID: TRIGLYCERIDES BODY FLUID: 1873 mg/dL

## 2021-04-14 MED ORDER — SODIUM PHOSPHATE 3 MMOL/ML INTRAVENOUS SOLUTION
30.0000 mmol | Freq: Once | INTRAVENOUS | Status: AC
Start: 2021-04-14 — End: 2021-04-14
  Administered 2021-04-14: 30 mmol via INTRAVENOUS
  Administered 2021-04-14: 0 mmol via INTRAVENOUS
  Filled 2021-04-14: qty 10

## 2021-04-14 MED ORDER — MAGNESIUM SULFATE 4 MEQ/ML (50 %) INJECTION SOLUTION
INTRAMUSCULAR | Status: AC
Start: 2021-04-14 — End: 2021-04-15
  Filled 2021-04-14: qty 500

## 2021-04-14 NOTE — Progress Notes (Signed)
[facility]  Surgical Oncology  Progress Note      [name], [name], 76 y.o. male  Date of Birth:  1945-09-30  Date of Admission:  04/03/2021  Date of service: 04/14/2021    Assessment:  This is a 76 y.o. 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 He underwent massive transfusion protocol and ex lap on 04/03/21 at an outside facility with ligation of a bleeding vessel in the liver bed. Patient was subsequently transferred to [facility] postoperaively, intubated. Patient was subsequently extubated later that day on 3/4. 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       Plan/Recommendations:  - Repeat H+H this afternoon  - Diet: Regular diet   - Aspiration precautions - HOB 30  - Patient underwent drain placement & emobolization with IR on 3/10         - JP continues to drain pancreatic fluid               -- Amylase level > 6,554 04/06/21 & 04/08/21 and 04/10/21   -Leukocytosis improving    -CT C/A/P                  -- Peripancreatic fluid collections                  -- IR consulted; drain placement 3/10- now with bloody output again- hold hep gtt              - Blood cultures 3/9: + pseudomonas    - Cultures form R JP: Pseudomonas and enterococcus              - Repeat Blood cultures today   - Continue TPN   - Continue Vanc, Cefepime, flagyl               - ID consulted   - PE   - heparin drip to 18 u/kg              - Trend H&H    - Encouraged IS and ambulation   - Aggressive plum toilet   - Wean O2   - C/f for midline fistula        - increased brown drainage from midline incision        - ostomy bag added to monitor output       - Continue to monitor     - OOB TID  - PT/OT ordered    Subjective: Pain improved today. Denies any nausea or vomiting.      Objective  Filed Vitals:    04/13/21 2300 04/13/21 2315 04/14/21 0245  04/14/21 0300   BP: 125/68  131/72    Pulse: (!) 106  89    Resp: 19  17    Temp:  36.6 C (97.9 F)  36.7 C (98 F)   SpO2: 95%  96%      Physical Exam:   GEN:  AOx4, resting in bed, no acute distress  PULM: Normal respiratory effort.   CV:  Tachycardic, regular rhythm   ABD:   Abdomen soft, distended, TTP RUQ. JP in place in RUQ with dark output,  Lower midline incision with feculent drainage present with ostomy bag over it.  Pigtail catheter present with ss output.   MS: Atraumatic.  Distal pulses  intact.  Normal strength and range of motion of all extremities.    NEURO:   Alert and oriented to person, place and time.  Cranial nerves grossly intact.    Vascular:  All pulses palpable and equal bilaterally  Integumentary:  Pink, warm, and dry  PSYCHOSOCIAL: Pleasant.  Normal affect.     Labs  Results for orders placed or performed during the hospital encounter of 04/03/21 (from the past 24 hour(s))   PTT (PARTIAL THROMBOPLASTIN TIME) - ONCE   Result Value Ref Range    APTT 54.5 (H) 24.2 - 37.5 seconds    Narrative    Therapeutic range for unfractionated heparin is 60-100 seconds.   H & H   Result Value Ref Range    HGB 8.4 (L) 13.4 - 17.5 g/dL    HCT 25.9 (L) 38.9 - 52.0 %   PTT (PARTIAL THROMBOPLASTIN TIME) - ONCE   Result Value Ref Range    APTT 54.4 (H) 24.2 - 37.5 seconds    Narrative    Therapeutic range for unfractionated heparin is 60-100 seconds.   PTT (PARTIAL THROMBOPLASTIN TIME)   Result Value Ref Range    APTT 60.1 (H) 24.2 - 37.5 seconds    Narrative    Therapeutic range for unfractionated heparin is 60-100 seconds.   BASIC METABOLIC PANEL   Result Value Ref Range    SODIUM 132 (L) 136 - 145 mmol/L    POTASSIUM 4.0 3.5 - 5.1 mmol/L    CHLORIDE 102 96 - 111 mmol/L    CO2 TOTAL 25 23 - 31 mmol/L    ANION GAP 5 4 - 13 mmol/L    CALCIUM 7.7 (L) 8.8 - 10.2 mg/dL    GLUCOSE 170 (H) 65 - 125 mg/dL    BUN 11 8 - 25 mg/dL    CREATININE 0.63 (L) 0.75 - 1.35 mg/dL    BUN/CREA RATIO 17 6 - 22    ESTIMATED GFR >90  >=60 mL/min/BSA   CBC   Result Value Ref Range    WBC 25.8 (H) 3.7 - 11.0 x10^3/uL    RBC 2.59 (L) 4.50 - 6.10 x10^6/uL    HGB 7.4 (L) 13.4 - 17.5 g/dL    HCT 24.0 (L) 38.9 - 52.0 %    MCV 92.7 78.0 - 100.0 fL    MCH 28.6 26.0 - 32.0 pg    MCHC 30.8 (L) 31.0 - 35.5 g/dL    RDW-CV 15.9 (H) 11.5 - 15.5 %    PLATELETS 716 (H) 150 - 400 x10^3/uL    MPV 12.0 8.7 - 12.5 fL   MAGNESIUM   Result Value Ref Range    MAGNESIUM 1.8 1.8 - 2.6 mg/dL   PHOSPHORUS   Result Value Ref Range    PHOSPHORUS 1.7 (L) 2.3 - 4.0 mg/dL   PTT (PARTIAL THROMBOPLASTIN TIME) - ONCE   Result Value Ref Range    APTT 54.9 (H) 24.2 - 37.5 seconds    Narrative    Therapeutic range for unfractionated heparin is 60-100 seconds.   POC BLOOD GLUCOSE (RESULTS)   Result Value Ref Range    GLUCOSE, POC 180 (H) 70 - 105 mg/dl   POC BLOOD GLUCOSE (RESULTS)   Result Value Ref Range    GLUCOSE, POC 196 (H) 70 - 105 mg/dl   POC BLOOD GLUCOSE (RESULTS)   Result Value Ref Range    GLUCOSE, POC 184 (H) 70 - 105 mg/dl   POC BLOOD GLUCOSE (RESULTS)   Result Value  Ref Range    GLUCOSE, POC 204 (H) 70 - 105 mg/dl       I/O:  Date 04/13/21 0700 - 04/14/21 0659 04/14/21 0700 - 04/15/21 0659   Shift 0700-1459 1500-2259 2300-0659 24 Hour Total 0700-1459 1500-2259 2300-0659 24 Hour Total   INTAKE   P.O.  180  180         Oral  180  180       I.V.(mL/kg/hr) 984.8(1.46) 246.2(0.36)  1231         Heparin Volume 104.8 26.2  131         Volume Infused (adult custom parenteral nutrition) 280 70  350         Volume Infused (electrolyte-A (PLASMALYTE-A) premix infusion) 600 150  750       Other 0 0  0         Drain Input (Drain (Miscellaneous) #94F pigtail catheter for RLQ fluid collection Lower;Right;Anterior Abdomen) 0 0  0       IV Piggyback 228 172 120 520         Volume (meropenem (MERREM) 1 g in NS 100 mL IVPB) 120 120 120 360         Volume (sodium phosphate 20 mmol in D5W 100 mL IVPB) 108   108         Volume (thiamine (VITAMIN B1) 200 mg in NS 50 mL IVPB)  52  52        Shift Total(mL/kg) 1212.8(14.37) 598.2(7.09) 120(1.42) 0998(33.82)       OUTPUT   Urine(mL/kg/hr) 550(0.81) 800(1.18) 825 2175         Urine (Voided) 550 [phone]       Emesis             Emesis Occurrence   0 x 0 x       Drains 440 [phone]         JP Drain Output ([place] Drain Right;Upper Abdomen) 5 0 0 5         Drain Output (Drain (Miscellaneous) #94F pigtail catheter for RLQ fluid collection Lower;Right;Anterior Abdomen) 350 330 240 920         Drain Output (Drain (Miscellaneous) peds ostomy bag Lower;Medial Abdomen) 85  100 185       Other             Other   0 x 0 x       Stool             Stool Occurrence   0 x 0 x       Shift Total(mL/kg) 505(39.76) 7341(93.79) 0240(97.3) 5329(92.42)       Weight (kg) 84.4 84.4 84.4 84.4 84.4 84.4 84.4 84.4       Radiology       Current Medications:  Current Facility-Administered Medications   Medication Dose Route Frequency    acetaminophen (TYLENOL) tablet  650 mg Oral Q4H    adult custom parenteral nutrition   Intravenous Continuous    atorvastatin (LIPITOR) tablet  40 mg Oral Daily with Breakfast    benzonatate (TESSALON) capsule  100 mg Oral Q8H PRN    D5W 250 mL flush bag   Intravenous Q15 Min PRN    DAPTOmycin (CUBICIN) 800 mg in NS 50 mL IVPB  10 mg/kg (Adjusted) Intravenous Q24H    docusate sodium (COLACE) capsule  100 mg Oral 2x/day    electrolyte-A (PLASMALYTE-A) premix infusion   Intravenous Continuous  elvitegravir-cobicistat-emtricitrabine-tenofovir (GENVOYA) 150-150-200-10 mg per tablet  1 Tablet Oral Daily    gabapentin (NEURONTIN) capsule  300 mg Oral 3x/day    heparin 25,000 units in NS 250 mL infusion  18 Units/kg/hr (Adjusted) Intravenous Continuous    HYDROmorphone (DILAUDID) 1 mg/mL injection  0.2 mg Intravenous Q3H PRN    iopamidol (ISOVUE-370) 76% infusion  100 mL Intravenous Give in Radiology    meropenem (MERREM) 1 g in NS 100 mL IVPB  1 g Intravenous Q8H    methocarbamol (ROBAXIN) tablet  500 mg Oral 4x/day     multivitamin-minerals-folic acid-lycopene-lutein (CERTAVITE SENIOR) tablet  1 Tablet Oral Q24H    NS 250 mL flush bag   Intravenous Q15 Min PRN    NS flush syringe  2-6 mL Intracatheter Q8HRS    NS flush syringe  2-6 mL Intracatheter Q1 MIN PRN    NS flush syringe  10-30 mL Intracatheter Q8HRS    NS flush syringe  20-30 mL Intracatheter Q1 MIN PRN    NS flush syringe  10-30 mL Intracatheter Q8HRS    NS flush syringe  20-30 mL Intracatheter Q1 MIN PRN    ondansetron (ZOFRAN) 2 mg/mL injection  4 mg Intravenous Q6H PRN    oxyCODONE (ROXICODONE) immediate release tablet  5 mg Oral Q4H PRN    oxyCODONE (ROXICODONE) immediate releaste tablet  10 mg Oral Q4H PRN    pantoprazole (PROTONIX) injection  40 mg Intravenous Daily    sennosides-docusate sodium (SENOKOT-S) 8.6-'50mg'$  per tablet  1 Tablet Oral 2x/day    SSIP insulin R human (HUMULIN R) 100 units/mL injection  0-12 Units Subcutaneous Q6H PRN       Allene Pyo, PA-C  04/14/2021, 06:55    I personally saw and examined the patient. See physician's assistant note for additional details.     Lynwood Dawley, MD, FACS

## 2021-04-14 NOTE — Consults (Signed)
PATIENT NAME:  [name] NUMBER:  [id number]  DATE OF SERVICE: 04/14/2021  DATE OF BIRTH:  [birth year]      INFECTIOUS DISEASE CONSULT FOLLOW-UP NOTE    HOSPITAL DAY:  LOS: 11 days     REASON FOR CONSULTATION:  Gram-negative rod bacteremia and possible intra-abdominal infected hematoma in a patient with well controlled HIV infection    SUBJECTIVE: This morning, [name] reports that he is doing about the same overall.  He reports that he continues to have some RUQ tenderness where the drains are located.  He continues to have stool leakage from the inferior aspect of his incision.  He states that this is his biggest concern at this time.  He reports that the primary team have informed him that the output has decreased.  He has been initiated on TPN.  He reports that he is tolerating antimicrobial therapy with daptomycin and meropenem well.  He denies experiencing any adverse reactions including rash, itching, or diarrhea.      OBJECTIVE:  Vitals:  BP 131/72   Pulse 89   Temp 36.7 C (98.1 F)   Resp 17   Ht 1.753 m ('5\' 9"'$ )   Wt 84.4 kg (186 lb 1.1 oz)   SpO2 96%   BMI 27.48 kg/m   General:  Overall, the patient appears stated age.  He appears acutely ill, but non-toxic. He is in no acute distress  Eyes:  Conjunctiva are clear bilaterally.  Pupils are equal and round.  Sclera are non-icteric.  HENT:  Head is atraumatic and normocephalic.  Mucous membranes are moist.  There are no oral lesions.  Cardiovascular:  Heart is regular rate and rhythm with a normal S1 and S2.  There are no murmurs.  Respiratory:  Lungs are clear to auscultation bilaterally.  Abdomen:  There is a vertical midline incision in place with no surrounding erythema or induration.  There are staples in place.  There is drainage of fecal material from the inferior aspect of the incision consistent with enterocutaneous fistula. There are two drains in place on the right including a JP drain with biliary appearing output and  a pigtail drain with sanguinopurulent appearing output.  Mildly distended.  There is tenderness to palpation in the right upper quadrant.  Bowel sounds are normoactive.  Extremities:  There is no joint erythema or swelling.  There is 1-2+ bilateral lower extremity edema.  Skin:  There are no diffuse rashes.  Neurologic:  Patient is awake, alert, and oriented x 3.  CN II-XII are in tact.  There are no focal deficits.    CURRENT ANTIMICROBIAL THERAPY:  Daptomycin 10 mg/kg IV every 24 hours  Meropenem 1g IV every 8 hours  Genvoya 150-150-200-10 mg 1 tablet p.o. Daily    ACCESS SITES:  Patient Lines/Drains/Airways Status     Active Line / Dialysis Catheter / Dialysis Graft / Drain / Airway / Wound     Name Placement date Placement time Site Days    Peripheral IV Ultrasound guided Proximal;Right Basilic  (medial side of arm) 04/06/21  0958  -- 7    Peripheral IV Ultrasound guided Anterior;Distal;Left;Upper Arm 04/09/21  1023  -- 4    Peripheral IV Ultrasound guided Distal;Right Forearm 04/09/21  1030  -- 4    PICC Double Lumen Lumen 1 Red Lumen 2 Purple Right;Basilic Vein Central 89/16/94  1520  4 FR  1    [employer] Drain Right;Upper Abdomen 04/03/21  1055  -- 10  Drain (Miscellaneous) #65F pigtail catheter for RLQ fluid collection Lower;Right;Anterior Abdomen 04/09/21  1433  -- 4    Drain (Miscellaneous) peds ostomy bag Lower;Medial Abdomen 04/12/21  1300  -- 1    Surgical Incision Abdomen 04/03/21  1046  -- 10    Surgical Incision Other (Comment) Left;Upper Abdomen 04/03/21  1400  -- 10                LABORATORY STUDIES:   Results for orders placed or performed during the hospital encounter of 04/03/21 (from the past 24 hour(s))   PTT (PARTIAL THROMBOPLASTIN TIME) - ONCE   Result Value Ref Range    APTT 54.5 (H) 24.2 - 37.5 seconds    Narrative    Therapeutic range for unfractionated heparin is 60-100 seconds.   H & H   Result Value Ref Range    HGB 8.4 (L) 13.4 - 17.5 g/dL    HCT 25.9 (L) 38.9 - 52.0 %   PTT  (PARTIAL THROMBOPLASTIN TIME) - ONCE   Result Value Ref Range    APTT 54.4 (H) 24.2 - 37.5 seconds    Narrative    Therapeutic range for unfractionated heparin is 60-100 seconds.   PTT (PARTIAL THROMBOPLASTIN TIME)   Result Value Ref Range    APTT 60.1 (H) 24.2 - 37.5 seconds    Narrative    Therapeutic range for unfractionated heparin is 60-100 seconds.   BASIC METABOLIC PANEL   Result Value Ref Range    SODIUM 132 (L) 136 - 145 mmol/L    POTASSIUM 4.0 3.5 - 5.1 mmol/L    CHLORIDE 102 96 - 111 mmol/L    CO2 TOTAL 25 23 - 31 mmol/L    ANION GAP 5 4 - 13 mmol/L    CALCIUM 7.7 (L) 8.8 - 10.2 mg/dL    GLUCOSE 170 (H) 65 - 125 mg/dL    BUN 11 8 - 25 mg/dL    CREATININE 0.63 (L) 0.75 - 1.35 mg/dL    BUN/CREA RATIO 17 6 - 22    ESTIMATED GFR >90 >=60 mL/min/BSA   CBC   Result Value Ref Range    WBC 25.8 (H) 3.7 - 11.0 x10^3/uL    RBC 2.59 (L) 4.50 - 6.10 x10^6/uL    HGB 7.4 (L) 13.4 - 17.5 g/dL    HCT 24.0 (L) 38.9 - 52.0 %    MCV 92.7 78.0 - 100.0 fL    MCH 28.6 26.0 - 32.0 pg    MCHC 30.8 (L) 31.0 - 35.5 g/dL    RDW-CV 15.9 (H) 11.5 - 15.5 %    PLATELETS 716 (H) 150 - 400 x10^3/uL    MPV 12.0 8.7 - 12.5 fL   MAGNESIUM   Result Value Ref Range    MAGNESIUM 1.8 1.8 - 2.6 mg/dL   PHOSPHORUS   Result Value Ref Range    PHOSPHORUS 1.7 (L) 2.3 - 4.0 mg/dL   PTT (PARTIAL THROMBOPLASTIN TIME) - ONCE   Result Value Ref Range    APTT 54.9 (H) 24.2 - 37.5 seconds    Narrative    Therapeutic range for unfractionated heparin is 60-100 seconds.   HEPATIC FUNCTION PANEL   Result Value Ref Range    ALBUMIN 1.3 (L) 3.4 - 4.8 g/dL     ALKALINE PHOSPHATASE 143 (H) 45 - 115 U/L    ALT (SGPT) 12 10 - 55 U/L    AST (SGOT)  18 8 - 45 U/L    BILIRUBIN TOTAL 0.2 (  L) 0.3 - 1.3 mg/dL    BILIRUBIN DIRECT 0.1 0.1 - 0.4 mg/dL    PROTEIN TOTAL 4.5 (L) 6.0 - 8.0 g/dL   POC BLOOD GLUCOSE (RESULTS)   Result Value Ref Range    GLUCOSE, POC 196 (H) 70 - 105 mg/dl   POC BLOOD GLUCOSE (RESULTS)   Result Value Ref Range    GLUCOSE, POC 184 (H) 70 - 105 mg/dl    POC BLOOD GLUCOSE (RESULTS)   Result Value Ref Range    GLUCOSE, POC 204 (H) 70 - 105 mg/dl   POC BLOOD GLUCOSE (RESULTS)   Result Value Ref Range    GLUCOSE, POC 181 (H) 70 - 105 mg/dl       MICROBIOLOGY:   Blood cultures 04/08/2021:  Positive for Pseudomonas aeruginosa in 1/2 bottles  MRSA nasal PCR screen 04/08/2021:  Positive  Blood cultures 04/09/2021:  Negative  Right upper quadrant fluid culture 04/09/2021:  Positive for Pseudomonas aeruginosa and Enterococcus faecium  HIV RNA quantitative PCR 04/10/2021:  Pending  Blood Cultures 04/11/2021:  No growth x 2 days    IMAGING STUDIES:  No new imaging studies for review    ASSESSMENT:  [name] is a very pleasant 76 y.o. gentleman with a past medical history significant for HIV infection, stable on Genvoya, hypertension, hyperlipidemia. DVT/PE, MSSA right native hip septic arthritis, perforated diverticulitis s/p colectomy, hypothyroidism, and intraductal papillary mucinous neoplasm (IPMN) who was admitted on 04/03/21 with intra-abdominal hemorrhage.  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  His post-operative course was complicated by development of a PE for which he was started on Eliquis.  He presented to [facility]21 due to increased bleeding around his JP drain at which time a CTA showed extravasation around the pancreatic resection site.  At that facility he underwent exploratory laparotomy with ligation of bleeding vessels prior to transfer here for further management.  Following admission here, he developed worsening leukocytosis.  Blood cultures drawn on 04/08/21 are now positive for Pseudomonas aeruginosa in 1/2 sets drawn from the midline.  CT scan of the chest, abdomen, and pelvis obtained on 04/08/21 showed extensive multiloculated RUQ fluid collection and additional complex fluid collections in the  pancreatectomy bed and splenectomy bed, but no active bleeding.  On 04/09/21, he underwent IR-guided RUQ peritoneal drain placement and empiric embolization of the splenic artery stump and left inferior adrenal artery.  The abdominal fluid was bloody in appearance with cultures now positive for Pseudomonas aeruginosa and Enterococcus faecium.  He is currently on antimicrobial therapy with daptomycin and meropenem, which he is tolerating well.        RECOMMENDATIONS:    Pseudomonas Bacteremia with Concern for Infected Intra-abdominal Hematoma  1. Continue to follow pending blood cultures from 04/09/21 and 04/11/21, which are no growth to date  2. Please continue antimicrobial therapy with daptomycin dosed 10 mg/kg IV every 24 hours.   3. Please continue antimicrobial therapy with meropenem dosed 1g IV every 8 hours.  4. Please continue to monitor CBC with differential, BUN, creatinine, hepatic function panel, CK, and CRP while the patient is on antimicrobial therapy.  5. The patient will ultimately require a 4 week treatment course of antimicrobial therapy from the date of drainage with interval re-imaging prior to discontinuation.  6. Please place OPAT consult order if patient is to be discharged while still receiving IV antimicrobial therapy for additional outpatient  monitoring.   7. The patient may follow-up in infectious disease clinic 2 weeks from the date of discharge.  Alternatively, he may follow-up with his local infectious disease provider Dr. Carmie End.    HIV Infection  1. CD4 count obtained on 04/03/21 was low at 117 with preserved percentage and CD4:CD8 ratio.    2. Repeat CD4 count on 04/10/21 was 273.  No need for prophylaxis.  3. Please continue antiretroviral therapy with Genvoya.  Please check for any drug interactions with this agent prior to initiation of new medications  4. Continue to follow pending HIV PCR    Thank you for this consultation.  We will sign off at this time.  Please call or page with any  further questions, concerns, or additional microbiologic data.    MDM Level    1. Problems addressed: High (>or= chronic illness w severe exacerbation, progression, or treatment side-effects; 1 acute or chronic illness or injury that poses a threat to life or bodily function): Pseudomonas bacteremia, HIV infection, Intra-abdominal infected hematoma  2. Data reviewed & analyzed: Moderate: 1 of 3 following: review of external notes, review of tests results, ordering of tests, assessment requiring an independent historian; independent interpretation of a test performed by another provider not separately reported; discussion of management or test interpretation w external provider: I have reviewed all of the available laboratory results and microbiology data  3. Risk of morbidity: High (ex: drug therapy requiring intensive monitoring, decision regarding major surgery with risk factors, decision regarding hospitalization or escalation of hospital level of care): The patient is receiving IV antimicrobial therapy, which requires intensive monitoring      Valorie Roosevelt, MD, PhD  Associate Professor  Department of Internal Medicine  Section of Infectious Diseases  [facility]

## 2021-04-14 NOTE — Care Plan (Signed)
04/14/21 1530   Therapist Pager   OT Assigned/ Pager # Gloriann Riede [phone]   Rehab Session   Document Type rehab contact note   Total OT Minutes: 0   Clinical Impression   Criteria for Skilled Therapeutic Interventions Met (OT) yes   Daily Activity AM-PAC/6-clicks Score   Patient Mobility Barrier Patient participating in other care at bedside

## 2021-04-14 NOTE — Ancillary Notes (Signed)
[facility]  Spiritual Care Note    Patient Name:  [name]  Date of Encounter:   04/14/2021    Other Pertinent Information: [name] and his wife conducted spiritual reflection in relation to their present state of affairs. They expressed to have a good family and church support systems. I provided empathy as they processed their stories, and a sacred space for spiritual reflection. They highly appreciated the visit. Chaplains are here 24/7     04/14/21 1135   Clinical Encounter Type   Reason for Visit Patient/Person/Family Request   Referral From Care Management   Declined Spiritual Care Visit At This Time No   Visited With Patient;Spouse   Patient Spiritual Encounters   Spiritual Needs/Issues Pain   Spiritual/Coping Resources Beliefs helpful in coping   Coping Provided supportive presence;Offered empathy   Other Support Services Provided Non-anxious presence   Information/Education Provided Spiritual Care scope of service   Spiritual Care outcomes with Patient   Spiritual/Emotional Processing  Spiritual Care relationship established    Patient Coping More hopeful   Family Spiritual Encounters   Spiritual Assessment Grief   Spiritual/Coping Resources Beliefs helpful in coping   Coping  Offered empathy   Support Services Provided Non-anxious presence   Information Provided Spiritual Care scope of service   Time of Encounters   Start Time 1129   Stop Time 1135   Duration (minutes) 6 Minutes     Sherlynn Carbon, [place]  Pager: [phone]  Total Time of Encounter: 11 min.

## 2021-04-15 DIAGNOSIS — I2699 Other pulmonary embolism without acute cor pulmonale: Secondary | ICD-10-CM

## 2021-04-15 LAB — BASIC METABOLIC PANEL
ANION GAP: 3 mmol/L — ABNORMAL LOW (ref 4–13)
BUN/CREA RATIO: 19 (ref 6–22)
BUN: 12 mg/dL (ref 8–25)
CALCIUM: 7.8 mg/dL — ABNORMAL LOW (ref 8.8–10.2)
CHLORIDE: 106 mmol/L (ref 96–111)
CO2 TOTAL: 24 mmol/L (ref 23–31)
CREATININE: 0.64 mg/dL — ABNORMAL LOW (ref 0.75–1.35)
ESTIMATED GFR: >90 mL/min/BSA (ref >=60)
GLUCOSE: 195 mg/dL — ABNORMAL HIGH (ref 65–125)
POTASSIUM: 4.3 mmol/L (ref 3.5–5.1)
SODIUM: 133 mmol/L — ABNORMAL LOW (ref 136–145)

## 2021-04-15 LAB — MAGNESIUM: MAGNESIUM: 2 mg/dL (ref 1.8–2.6)

## 2021-04-15 LAB — CBC
HCT: 24.4 % — ABNORMAL LOW (ref 38.9–52.0)
HGB: 7.7 g/dL — ABNORMAL LOW (ref 13.4–17.5)
MCH: 29.4 pg (ref 26.0–32.0)
MCHC: 31.6 g/dL (ref 31.0–35.5)
MCV: 93.1 fL (ref 78.0–100.0)
MPV: 11.5 fL (ref 8.7–12.5)
PLATELETS: 804 10*3/uL — ABNORMAL HIGH (ref 150–400)
RBC: 2.62 10*6/uL — ABNORMAL LOW (ref 4.50–6.10)
RDW-CV: 16 % — ABNORMAL HIGH (ref 11.5–15.5)
WBC: 27.8 10*3/uL — ABNORMAL HIGH (ref 3.7–11.0)

## 2021-04-15 LAB — PHOSPHORUS: PHOSPHORUS: 1.9 mg/dL — ABNORMAL LOW (ref 2.3–4.0)

## 2021-04-15 LAB — POC BLOOD GLUCOSE (RESULTS)
GLUCOSE, POC: 219 mg/dl — ABNORMAL HIGH (ref 70–105)
GLUCOSE, POC: 219 mg/dl — ABNORMAL HIGH (ref 70–105)
GLUCOSE, POC: 179 mg/dl — ABNORMAL HIGH (ref 70–105)

## 2021-04-15 LAB — PTT (PARTIAL THROMBOPLASTIN TIME): APTT: 60.1 seconds — ABNORMAL HIGH (ref 24.2–37.5)

## 2021-04-15 LAB — H & H
HCT: 23.4 % — ABNORMAL LOW (ref 38.9–52.0)
HGB: 7.5 g/dL — ABNORMAL LOW (ref 13.4–17.5)

## 2021-04-15 LAB — HIV-1 RNA QUANTITATIVE PCR, PLASMA: HIV1 RNA VIRAL LOAD: NOT DETECTED

## 2021-04-15 MED ORDER — PANTOPRAZOLE 40 MG INTRAVENOUS SOLUTION
80.0000 mg | INTRAVENOUS | Status: AC
Start: 2021-04-15 — End: 2021-04-15
  Administered 2021-04-15: 0 mg via INTRAVENOUS
  Administered 2021-04-15: 80 mg via INTRAVENOUS
  Filled 2021-04-15: qty 20

## 2021-04-15 MED ORDER — PANTOPRAZOLE 40 MG INTRAVENOUS SOLUTION
40.0000 mg | Freq: Two times a day (BID) | INTRAVENOUS | Status: AC
Start: 2021-04-15 — End: 2021-04-17
  Administered 2021-04-15 – 2021-04-17 (×5): 40 mg via INTRAVENOUS
  Filled 2021-04-15 (×5): qty 10

## 2021-04-15 MED ORDER — SODIUM PHOSPHATE 3 MMOL/ML INTRAVENOUS SOLUTION
20.0000 mmol | Freq: Once | INTRAVENOUS | Status: AC
Start: 2021-04-15 — End: 2021-04-15
  Administered 2021-04-15: 0 mmol via INTRAVENOUS
  Administered 2021-04-15: 20 mmol via INTRAVENOUS
  Filled 2021-04-15: qty 6.67

## 2021-04-15 MED ORDER — SODIUM PHOSPHATE 3 MMOL/ML INTRAVENOUS SOLUTION
30.0000 mmol | Freq: Once | INTRAVENOUS | Status: AC
Start: 2021-04-15 — End: 2021-04-15
  Administered 2021-04-15: 0 mmol via INTRAVENOUS
  Administered 2021-04-15: 30 mmol via INTRAVENOUS
  Filled 2021-04-15: qty 10

## 2021-04-15 MED ORDER — PANTOPRAZOLE 40 MG TABLET,DELAYED RELEASE
40.0000 mg | DELAYED_RELEASE_TABLET | Freq: Two times a day (BID) | ORAL | Status: DC
Start: 2021-04-18 — End: 2021-05-28
  Administered 2021-04-18 – 2021-04-23 (×12): 40 mg via ORAL
  Administered 2021-04-24: 0 mg via ORAL
  Administered 2021-04-24 – 2021-05-08 (×28): 40 mg via ORAL
  Administered 2021-05-08 – 2021-05-13 (×11): 0 mg via ORAL
  Administered 2021-05-14: 40 mg via ORAL
  Administered 2021-05-14 – 2021-05-15 (×2): 0 mg via ORAL
  Administered 2021-05-15 – 2021-05-19 (×9): 40 mg via ORAL
  Administered 2021-05-20: 0 mg via ORAL
  Administered 2021-05-20 – 2021-05-28 (×17): 40 mg via ORAL
  Filled 2021-04-15 (×68): qty 1

## 2021-04-15 MED ORDER — INSULIN REGULAR HUMAN 100 UNIT/ML INJECTION SSIP
0.0000 [IU] | INJECTION | Freq: Four times a day (QID) | SUBCUTANEOUS | Status: DC | PRN
Start: 2021-04-15 — End: 2021-04-16
  Administered 2021-04-15: 4 [IU] via SUBCUTANEOUS
  Administered 2021-04-15 – 2021-04-16 (×2): 2 [IU] via SUBCUTANEOUS

## 2021-04-15 MED ORDER — WATER FOR INJECTION, STERILE INTRAVENOUS SOLUTION
INTRAVENOUS | Status: AC
Start: 2021-04-15 — End: 2021-04-16
  Filled 2021-04-15: qty 500

## 2021-04-15 MED ORDER — SODIUM CHLORIDE 0.9 % INTRAVENOUS SOLUTION
10.0000 mg/kg | INTRAVENOUS | Status: AC
Start: 2021-04-15 — End: 2021-05-09
  Administered 2021-04-15: 0 mg via INTRAVENOUS
  Administered 2021-04-15: 800 mg via INTRAVENOUS
  Administered 2021-04-16: 14:00:00 0 mg via INTRAVENOUS
  Administered 2021-04-16: 14:00:00 800 mg via INTRAVENOUS
  Administered 2021-04-17: 14:00:00 0 mg via INTRAVENOUS
  Administered 2021-04-17 – 2021-04-18 (×2): 800 mg via INTRAVENOUS
  Administered 2021-04-18 – 2021-04-19 (×2): 0 mg via INTRAVENOUS
  Administered 2021-04-19: 800 mg via INTRAVENOUS
  Administered 2021-04-20: 17:00:00 0 mg via INTRAVENOUS
  Administered 2021-04-20: 800 mg via INTRAVENOUS
  Administered 2021-04-21: 0 mg via INTRAVENOUS
  Administered 2021-04-21: 800 mg via INTRAVENOUS
  Administered 2021-04-22: 0 mg via INTRAVENOUS
  Administered 2021-04-22 – 2021-04-23 (×2): 800 mg via INTRAVENOUS
  Administered 2021-04-23: 16:00:00 0 mg via INTRAVENOUS
  Administered 2021-04-24: 15:00:00 800 mg via INTRAVENOUS
  Administered 2021-04-24: 0 mg via INTRAVENOUS
  Administered 2021-04-25: 800 mg via INTRAVENOUS
  Administered 2021-04-25: 17:00:00 0 mg via INTRAVENOUS
  Administered 2021-04-26: 15:00:00 800 mg via INTRAVENOUS
  Administered 2021-04-26: 16:00:00 0 mg via INTRAVENOUS
  Administered 2021-04-27: 800 mg via INTRAVENOUS
  Administered 2021-04-27: 0 mg via INTRAVENOUS
  Administered 2021-04-28: 800 mg via INTRAVENOUS
  Administered 2021-04-28 – 2021-04-29 (×2): 0 mg via INTRAVENOUS
  Administered 2021-04-29 – 2021-04-30 (×2): 800 mg via INTRAVENOUS
  Administered 2021-04-30: 0 mg via INTRAVENOUS
  Administered 2021-05-01: 800 mg via INTRAVENOUS
  Administered 2021-05-01: 0 mg via INTRAVENOUS
  Administered 2021-05-02: 800 mg via INTRAVENOUS
  Administered 2021-05-02 – 2021-05-03 (×2): 0 mg via INTRAVENOUS
  Administered 2021-05-03: 800 mg via INTRAVENOUS
  Administered 2021-05-04: 0 mg via INTRAVENOUS
  Administered 2021-05-04: 800 mg via INTRAVENOUS
  Administered 2021-05-05: 0 mg via INTRAVENOUS
  Administered 2021-05-05: 800 mg via INTRAVENOUS
  Administered 2021-05-06: 0 mg via INTRAVENOUS
  Administered 2021-05-06: 800 mg via INTRAVENOUS
  Administered 2021-05-07: 0 mg via INTRAVENOUS
  Administered 2021-05-07: 800 mg via INTRAVENOUS
  Administered 2021-05-08: 0 mg via INTRAVENOUS
  Administered 2021-05-08 – 2021-05-09 (×2): 800 mg via INTRAVENOUS
  Administered 2021-05-09: 0 mg via INTRAVENOUS
  Filled 2021-04-15 (×25): qty 16

## 2021-04-15 NOTE — Care Plan (Signed)
[facility]  Physical Therapy Progress Note      Patient Name: [name]  Date of Birth: 1945-10-24  Height:  175.3 cm ('5\' 9"'$ )  Weight:  84.4 kg (186 lb 1.1 oz)  Room/Bed: 14/A  Payor: MEDICARE / Plan: MEDICARE PART A AND B / Product Type: Medicare /     Assessment:     PT treatment complete, pt tolerating well, demonstrating bed mobility to EOB with Min A, sits 6 mins, completes STS Min A tolerating 2 mins, and pivots to bedside chair Min A x2 with some dizziness and fatigue. Pt limited by strength, balance, endurance. Anticipate D/C to inpatient rehab when medically appropriate.    Discharge Needs:   Equipment Recommendation: TBD    Discharge Disposition: inpatient rehabilitation facility    JUSTIFICATION OF DISCHARGE RECOMMENDATION   Based on current diagnosis, functional performance prior to admission, and current functional performance, this patient requires continued PT services in inpatient rehabilitation facility in order to achieve significant functional improvements in these deficit areas: aerobic capacity/endurance, gait, locomotion, and balance, muscle performance.      Plan:   Continue to follow patient according to established plan of care.  The risks/benefits of therapy have been discussed with the patient/caregiver and he/she is in agreement with the established plan of care.     Subjective & Objective:        04/15/21 0174   Therapist Pager   PT Assigned/ Pager # Einar Pheasant [phone]   Rehab Session   Document Type therapy progress note (daily note)   Total PT Minutes: 31   Patient Effort good   Symptoms Noted During/After Treatment fatigue;dizziness   General Information   Patient Profile Reviewed yes   Onset of Illness/Injury or Date of Surgery 04/03/21   Medical Lines PIV Line;Telemetry;Peripheral Drain   Respiratory Status room air   Existing Precautions/Restrictions full code;fall precautions   Mutuality/Individual Preferences   Individualized Care Needs OOB to  recliner via Denna Haggard with assist   Plan of Care Reviewed With patient;spouse   Pre Treatment Status   Pre Treatment Patient Status Patient supine in bed;Call light within reach;Telephone within reach;Sitter select activated;Nurse approved session;Venodynes in place and activated   Support Present Pre Treatment  Family present   Communication Pre Treatment  Nurse   Cognitive Assessment/Interventions   Behavior/Mood Observations behavior appropriate to situation, WNL/WFL   Orientation Status oriented x 4   Attention WNL/WFL   Follows Commands WNL   Vital Signs   Pre SpO2 (%) 91   O2 Delivery Pre Treatment room air   Post SpO2 (%) 91   O2 Delivery Post Treatment room air   Pain Assessment   Posttreatment Pain Rating 4/10   Pre/Posttreatment Pain Comment pain in abdomen   Bed Mobility Assessment/Treatment   Bed Mobility, Assistive Device Head of Bed Elevated   Impairments balance impaired;endurance;strength decreased   Roll Right Independence minimum assist (75% patient effort);verbal cues required   Sidelying to Sit, Independence minimum assist (75% patient effort);verbal cues required   Transfer Assessment/Treatment   Sit-Stand Independence minimum assist (75% patient effort);verbal cues required   Stand-Sit Independence minimum assist (75% patient effort);verbal cues required   Sit-Stand-Sit, Assist Device side by side   Bed-Chair Independence minimum assist (75% patient effort);verbal cues required   Transfer Impairments balance impaired;endurance;postural control impaired;strength decreased   Transfer Comment Stands EOB x 25mn and pivots to bedside chair   Balance Skill Training   Sitting Balance: Static  fair + balance   Sitting, Dynamic (Balance) fair - balance   Sit-to-Stand Balance fair - balance   Standing Balance: Static fair - balance   Standing Balance: Dynamic poor + balance   Therapeutic Exercise/Activity   Comment tolerates edge of bed sitting at least 6-8 min, static standing at least 2 min with  assist, able to take a few steps from bed to chair with minA of 2   Post Treatment Status   Post Treatment Patient Status Patient sitting in bedside chair or w/c;Call light within reach;Telephone within reach;Sitter select activated;Venodynes in place and activated   Support Present Post Treatment  Family present   Financial trader Nurse   Communication Post Treatment Comment pt performance   Plan of Care Review   Plan Of Care Reviewed With patient;spouse   Basic Mobility Am-PAC/6Clicks Score (APPROVED PT Staff, [facility], [place], [place], [place], and [facility])   Turning in bed without bedrails 4   Lying on back to sitting on edge of flat bed 4   Moving to and from a bed to a chair 3   Standing up from chair 3   Walk in room 2   Climbing 3-5 steps with railing 2   6 Clicks Raw Score total 18   Standardized (t-scale) score 41.05   CMS 0-100% Score 40.47   CMS Modifier CK   Physical Therapy Clinical Impression   Assessment PT treatment complete, pt tolerating well, demonstrating bed mobility to EOB with Min A, sits 6 mins, completes STS Min A tolerating 2 mins, and pivots to bedside chair Min A x2 with some dizziness and fatigue. Pt limited by strength, balance, endurance. Anticipate D/C to inpatient rehab when medically appropriate.   Anticipated Equipment Needs at Discharge (PT) TBD   Anticipated Discharge Disposition inpatient rehabilitation facility       Therapist:   Theodosia Blender, PT   Pager #: [phone]

## 2021-04-15 NOTE — Progress Notes (Signed)
[facility]  Surgical Oncology  Progress Note      [name], [name], 76 y.o. male  Date of Birth:  05-01-1945  Date of Admission:  04/03/2021  Date of service: 04/15/2021    Assessment:  This is a 76 y.o. 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 He underwent massive transfusion protocol and ex lap on 04/03/21 at an outside facility with ligation of a bleeding vessel in the liver bed. Patient was subsequently transferred to [facility] postoperaively, intubated. Patient was subsequently extubated later that day on 3/4. 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       Plan/Recommendations:  - Bloody BM this am   - Repeat H+H this afternoon   - Heparin held   - Will plan for IR consult for IVC filter  - Diet: NPO   - Patient underwent drain placement & emobolization with IR on 3/10         - JP continues to drain pancreatic fluid               -- Drain triglyceride 1,873  -Leukocytosis    -CT C/A/P                  -- Peripancreatic fluid collections                  -- IR consulted; drain placement 3/10              - Blood cultures 3/9: + pseudomonas    - Cultures form R JP: Pseudomonas and enterococcus              - Repeat Blood cultures 3/12 negative   - Continue TPN   - Continue Daptomycin and meropenum (stop date 04/25/21)              - ID consulted   - PE   - Will discuss IVF placement   - Trend H&H    - Encouraged IS and ambulation   - Aggressive plum toilet   - Wean O2   - C/f for midline fistula        - ostomy bag added to monitor output       - Continue to monitor     - OOB TID  - PT/OT ordered    Subjective:   Bloody BM this am. Pain well controlled.       Objective  Filed Vitals:    04/15/21 0330 04/15/21 0755 04/15/21 0800 04/15/21 0815   BP: 120/72  127/66    Pulse: (!) 113  (!) 118    Resp: '13 16  15    '$ Temp:  36.9 C (98.5 F)     SpO2: 94%  90%      Physical Exam:   GEN:  AOx4, resting in bed, no acute distress  PULM: Normal respiratory effort.   CV:  Tachycardic, regular rhythm   ABD:   Abdomen soft, distended, TTP RUQ. JP in place in RUQ with dark output,  Lower midline incision with feculent drainage present with ostomy bag over it.  Pigtail catheter present with ss output.   MS: Atraumatic.  Distal pulses intact.  Normal strength and range of motion of all extremities.    NEURO:   Alert and oriented to person, place and  time.  Cranial nerves grossly intact.    Vascular:  All pulses palpable and equal bilaterally  Integumentary:  Pink, warm, and dry  PSYCHOSOCIAL: Pleasant.  Normal affect.     Labs  Results for orders placed or performed during the hospital encounter of 04/03/21 (from the past 24 hour(s))   H & H   Result Value Ref Range    HGB 7.9 (L) 13.4 - 17.5 g/dL    HCT 24.7 (L) 38.9 - 52.0 %   TRIGLYCERIDE BODY FLUID   Result Value Ref Range    TRIGLYCERIDES BODY FLUID 1,873 No reference intervals are established. mg/dL   PHOSPHORUS   Result Value Ref Range    PHOSPHORUS 1.9 (L) 2.3 - 4.0 mg/dL   CBC   Result Value Ref Range    WBC 27.8 (H) 3.7 - 11.0 x10^3/uL    RBC 2.62 (L) 4.50 - 6.10 x10^6/uL    HGB 7.7 (L) 13.4 - 17.5 g/dL    HCT 24.4 (L) 38.9 - 52.0 %    MCV 93.1 78.0 - 100.0 fL    MCH 29.4 26.0 - 32.0 pg    MCHC 31.6 31.0 - 35.5 g/dL    RDW-CV 16.0 (H) 11.5 - 15.5 %    PLATELETS 804 (H) 150 - 400 x10^3/uL    MPV 11.5 8.7 - 12.5 fL   MAGNESIUM   Result Value Ref Range    MAGNESIUM 2.0 1.8 - 2.6 mg/dL   BASIC METABOLIC PANEL   Result Value Ref Range    SODIUM 133 (L) 136 - 145 mmol/L    POTASSIUM 4.3 3.5 - 5.1 mmol/L    CHLORIDE 106 96 - 111 mmol/L    CO2 TOTAL 24 23 - 31 mmol/L    ANION GAP 3 (L) 4 - 13 mmol/L    CALCIUM 7.8 (L) 8.8 - 10.2 mg/dL    GLUCOSE 195 (H) 65 - 125 mg/dL    BUN 12 8 - 25 mg/dL    CREATININE 0.64 (L) 0.75 - 1.35 mg/dL    BUN/CREA RATIO 19 6 - 22    ESTIMATED GFR >90 >=60  mL/min/BSA   PTT (PARTIAL THROMBOPLASTIN TIME) - ONCE   Result Value Ref Range    APTT 60.1 (H) 24.2 - 37.5 seconds    Narrative    Therapeutic range for unfractionated heparin is 60-100 seconds.   POC BLOOD GLUCOSE (RESULTS)   Result Value Ref Range    GLUCOSE, POC 188 (H) 70 - 105 mg/dl   POC BLOOD GLUCOSE (RESULTS)   Result Value Ref Range    GLUCOSE, POC 249 (H) 70 - 105 mg/dl   POC BLOOD GLUCOSE (RESULTS)   Result Value Ref Range    GLUCOSE, POC 199 (H) 70 - 105 mg/dl   POC BLOOD GLUCOSE (RESULTS)   Result Value Ref Range    GLUCOSE, POC 219 (H) 70 - 105 mg/dl       I/O:  Date 04/14/21 0700 - 04/15/21 0659 04/15/21 0700 - 04/16/21 0659   Shift 0700-1459 1500-2259 2300-0659 24 Hour Total 0700-1459 1500-2259 2300-0659 24 Hour Total   INTAKE   P.O. 355  60 415         Oral 355  60 415       I.V.(mL/kg/hr) 1231.99(1.82) 390.83(0.58) 1542.1(2.28) 3164.92(1.56) 372.3   372.3     Heparin Volume 260.32  222.7 483.02 39.3   39.3     Volume Infused (electrolyte-A (PLASMALYTE-A) premix infusion) 522.5  1050 1572.5 225  225     Volume Infused (adult custom parenteral nutrition) 449.17 390.83  840         Volume Infused (adult custom parenteral nutrition)   269.4 269.4 108   108   IV Piggyback 320   320         Volume (meropenem (MERREM) 1 g in NS 100 mL IVPB) 220   220         Volume (sodium phosphate 30 mmol in D5W 100 mL IVPB) 100   100       Shift Total(mL/kg) 0737.10(62.69) 485.46(2.70) 1602.1(18.98) 3500.93(81.82) 372.3(4.41)   372.3(4.41)   OUTPUT   Urine(mL/kg/hr) 700(1.04) 225(0.33) 350(0.52) 1275(0.63)         Urine (Voided) 700 (858) 643-1644         Urine Occurrence 1 x 1 x  2 x       Emesis             Emesis Occurrence  0 x  0 x       Drains 595 1105 600 2300         JP Drain Output ([place] Drain Right;Upper Abdomen) 60 50 10 120         Drain Output (Drain (Miscellaneous) #22F pigtail catheter for RLQ fluid collection Lower;Right;Anterior Abdomen) 410 703 080 7200         Drain Output (Drain  (Miscellaneous) peds ostomy bag Lower;Medial Abdomen) 125 375  500       Stool             Stool Occurrence  0 x 1 x 1 x       Shift Total(mL/kg) 9937(16.96) 7893(81.01) 751(02.58) 3575(42.36)       Weight (kg) 84.4 84.4 84.4 84.4 84.4 84.4 84.4 84.4       Radiology       Current Medications:  Current Facility-Administered Medications   Medication Dose Route Frequency    acetaminophen (TYLENOL) tablet  650 mg Oral Q4H    adult custom parenteral nutrition   Intravenous Continuous    atorvastatin (LIPITOR) tablet  40 mg Oral Daily with Breakfast    benzonatate (TESSALON) capsule  100 mg Oral Q8H PRN    D5W 250 mL flush bag   Intravenous Q15 Min PRN    DAPTOmycin (CUBICIN) 800 mg in NS 50 mL IVPB  10 mg/kg (Adjusted) Intravenous Q24H    docusate sodium (COLACE) capsule  100 mg Oral 2x/day    electrolyte-A (PLASMALYTE-A) premix infusion   Intravenous Continuous    elvitegravir-cobicistat-emtricitrabine-tenofovir (GENVOYA) 150-150-200-10 mg per tablet  1 Tablet Oral Daily    gabapentin (NEURONTIN) capsule  300 mg Oral 3x/day    heparin 25,000 units in NS 250 mL infusion  18 Units/kg/hr (Adjusted) Intravenous Continuous    HYDROmorphone (DILAUDID) 1 mg/mL injection  0.2 mg Intravenous Q3H PRN    meropenem (MERREM) 1 g in NS 100 mL IVPB  1 g Intravenous Q8H    methocarbamol (ROBAXIN) tablet  500 mg Oral 4x/day    multivitamin-minerals-folic acid-lycopene-lutein (CERTAVITE SENIOR) tablet  1 Tablet Oral Q24H    NS 250 mL flush bag   Intravenous Q15 Min PRN    NS flush syringe  2-6 mL Intracatheter Q8HRS    NS flush syringe  2-6 mL Intracatheter Q1 MIN PRN    NS flush syringe  10-30 mL Intracatheter Q8HRS    NS flush syringe  20-30 mL Intracatheter Q1 MIN PRN    NS flush syringe  10-30 mL Intracatheter Q8HRS    NS flush syringe  20-30 mL Intracatheter Q1 MIN PRN    ondansetron (ZOFRAN) 2 mg/mL injection  4 mg Intravenous Q6H PRN    oxyCODONE (ROXICODONE) immediate release tablet  5 mg Oral Q4H PRN    oxyCODONE (ROXICODONE)  immediate releaste tablet  10 mg Oral Q4H PRN    pantoprazole (PROTONIX) injection  40 mg Intravenous Q12H    And    [START ON 04/18/2021] pantoprazole (PROTONIX) delayed release tablet  40 mg Oral Q12H    sennosides-docusate sodium (SENOKOT-S) 8.6-'50mg'$  per tablet  1 Tablet Oral 2x/day    sodium phosphate 20 mmol in D5W 100 mL IVPB  20 mmol Intravenous Once    sodium phosphate 30 mmol in D5W 100 mL IVPB  30 mmol Intravenous Once    SSIP insulin R human (HUMULIN R) 100 units/mL injection  0-12 Units Subcutaneous Q6H PRN       Allene Pyo, PA-C  04/15/2021, 11:28    I personally saw and examined the patient. See physician's assistant note for additional details.     Lynwood Dawley, MD

## 2021-04-15 NOTE — Nurses Notes (Addendum)
Patient had bloody BM with rust color blood. VSS no other acute changes. Service notified.     Per service will continue to monitor.

## 2021-04-15 NOTE — Nurses Notes (Signed)
Patient had a moderate sized maroon/bloody bowel movement. Surg Oncology called Stacey Drain). Tachycardic in the 110-120's, otherwise vitally stable with no changes. No worsening abdominal pain/changes in characteristics. Verbal okay from Surg Oncology to continue heparin drip at this time and will further evaluate patient on rounds.

## 2021-04-15 NOTE — Consults (Signed)
INTERVENTIONAL RADIOLOGY  INITIAL BRIEF CONSULT NOTE      Patient Name: [name]  Patient MRN: [id number]  Patient DOB: 10-13-1945  Date of Service:04/15/2021  Admission Dx: Intra abdominal hemorrhage    Consult Requested By: D[facility] ONCOLOGY    Consulted regarding IVC filter placement. Pt has known recent DVT in the right popliteal vein and nonocclusive right segmental pulmonary emboli. Heparin was stopped this AM due to bloody bowel movement and concern for GI bleed. He has recent hemorrhage s/p abdominal surgery 04/03/21.     Pertinent Imaging reviewed and showed DVT in the right popliteal vein and nonocclusive right segmental pulmonary emboli.    VITALS            BP 127/66   Pulse (!) 118   Temp 36.7 C (98.1 F)   Resp 15   Ht 1.753 m ('5\' 9"'$ )   Wt 84.4 kg (186 lb 1.1 oz)   SpO2 90%   BMI 27.48 kg/m       LABS    CBC   Recent Labs     04/13/21  0027 04/13/21  1532 04/14/21  0542 04/14/21  1556 04/15/21  0414   WBC 33.6*  --  25.8*  --  27.8*   HGB 8.3*   < > 7.4* 7.9* 7.7*   HCT 26.2*   < > 24.0* 24.7* 24.4*   PLTCNT 657*  --  716*  --  804*    < > = values in this interval not displayed.      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    HTN (hypertension)     Human immunodeficiency virus (HIV) disease (CMS [facility])     Hyperlipidemia     "borderline"    Hypothyroidism     MRSA (methicillin resistant staph aureus) culture positive 01/02/2021    MRSA left groin abscess 01/03/21    MRSA  (methicillin resistant staph aureus) culture positive 01/02/2021    MRSA blood 01/02/21    Pulmonary embolism (CMS HCC) 03/31/2021    Thyroid disorder     Wears glasses          PSH     Past Surgical History:   Procedure Laterality Date    COLON SURGERY      per patient had 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 Darryl Nestle CERVICAL  SPINE SURGERY  2001    HX COLOSTOMY REVERSAL      HX HERNIA REPAIR      KNEE SURGERY Bilateral     WRIST SURGERY Right              FAMILY HISTORY:    His family history includes Cancer in his father, mother, and another family member; Heart Attack in his mother; High Cholesterol in an other family member; Stroke in an other family member.     SOCIAL HISTORY:    He  reports that he does not currently use alcohol. He  reports that he has never smoked. He has never used smokeless tobacco. He  reports no history of drug use.     OUTPATIENT MEDICATIONS  Medications Prior to Admission       Prescriptions    acetaminophen (TYLENOL) 500 mg Oral Tablet    Take 2 Tablets (1,000 mg total) by mouth Every 4 hours as needed for Pain    amLODIPine (NORVASC) 5 mg Oral Tablet    Take 1 Tablet (5 mg total) by mouth Once a day    apixaban (ELIQUIS) 5 mg Oral Tablet    Take 2 Tablets (10 mg total) by mouth Twice daily for 14 doses    apixaban (ELIQUIS) 5 mg Oral Tablet    Take 1 Tablet (5 mg total) by mouth Twice daily for 180 days    atorvastatin (LIPITOR) 80 mg Oral Tablet    Take 0.5 Tablets (40 mg total) by mouth Every morning with breakfast    BIOTIN ORAL    Take 1 Tablet by mouth Once a day    Blood Sugar Diagnostic (ACCU-CHEK GUIDE TEST STRIPS) Strip    1 Strip Four times a day - before meals and bedtime    cetirizine (ZYRTEC) 10 mg Oral Tablet    Take 1 Tablet (10 mg total) by mouth Once a day    cyanocobalamin (VITAMIN B 12) 1,000 mcg Oral Tablet     Take 1 Tablet (1,000 mcg total) by mouth Every morning    elviteg-cob-emtri-tenof ALAFEN (GENVOYA) 150-150-200-10 mg Oral Tablet    Take 1 Tablet by mouth Once a day    flash glucose scanning reader (FREESTYLE LIBRE 2 READER) Does not apply Misc    Use as directed with FreeStyle Libre 2 sensors    flash glucose sensor (FREESTYLE LIBRE 2 SENSOR) Does not apply Kit    To check blood glucose continuously. Change every 14 days    gabapentin (NEURONTIN) 300 mg Oral Capsule    Take 1 Capsule (300 mg total) by mouth Three times a day    insulin aspart U-100 (NOVOLOG) 100 unit/mL (3 mL) Subcutaneous Insulin Pen    Inject 4 Units under the skin Three times a day With meals    insulin glargine (LANTUS SOLOSTAR U-100 INSULIN) 100 unit/mL Subcutaneous Insulin Pen    Inject 15 units under the skin daily    insulin syr/ndl U100 half mark 0.3 mL 31 gauge x 5/16" Syringe    Use a new syringe for each injection.    lancets (FREESTYLE LANCETS) 28 gauge Misc    Use to test blood sugar 4 times a day - before meals and bedtime    levothyroxine (SYNTHROID) 88 mcg Oral Tablet    Take 1 Tablet (88 mcg total) by mouth Every morning    melatonin 5 mg Oral Tablet    Take 1 Tablet (5 mg total) by mouth Every night  MULTIVITAMIN ORAL    Take 1 Tablet by mouth Once a day    pantoprazole (PROTONIX) 40 mg Oral Tablet, Delayed Release (E.C.)    Take 1 Tablet (40 mg total) by mouth Twice daily 30 minutes before a meal    Pen Needle, Disposable, (BD NANO 2ND GEN PEN NEEDLE) 32 gauge x 5/32" Needle    Use a new pen needle for each injection.    POTASSIUM ORAL    Take 1 Tablet by mouth Once a day          INPATIENT MEDICATIONS  acetaminophen (TYLENOL) tablet, 650 mg, Oral, Q4H  adult custom parenteral nutrition, , Intravenous, Continuous  atorvastatin (LIPITOR) tablet, 40 mg, Oral, Daily with Breakfast  benzonatate (TESSALON) capsule, 100 mg, Oral, Q8H PRN  D5W 250 mL flush bag, , Intravenous, Q15 Min PRN  DAPTOmycin (CUBICIN) 800 mg in NS 50 mL  IVPB, 10 mg/kg (Adjusted), Intravenous, Q24H  docusate sodium (COLACE) capsule, 100 mg, Oral, 2x/day  electrolyte-A (PLASMALYTE-A) premix infusion, , Intravenous, Continuous  elvitegravir-cobicistat-emtricitrabine-tenofovir (GENVOYA) 150-150-200-10 mg per tablet, 1 Tablet, Oral, Daily  gabapentin (NEURONTIN) capsule, 300 mg, Oral, 3x/day  [Held by provider] heparin 25,000 units in NS 250 mL infusion, 18 Units/kg/hr (Adjusted), Intravenous, Continuous  HYDROmorphone (DILAUDID) 1 mg/mL injection, 0.2 mg, Intravenous, Q3H PRN  meropenem (MERREM) 1 g in NS 100 mL IVPB, 1 g, Intravenous, Q8H  methocarbamol (ROBAXIN) tablet, 500 mg, Oral, 4x/day  multivitamin-minerals-folic acid-lycopene-lutein (CERTAVITE SENIOR) tablet, 1 Tablet, Oral, Q24H  NS 250 mL flush bag, , Intravenous, Q15 Min PRN  NS flush syringe, 2-6 mL, Intracatheter, Q8HRS  NS flush syringe, 2-6 mL, Intracatheter, Q1 MIN PRN  NS flush syringe, 10-30 mL, Intracatheter, Q8HRS  NS flush syringe, 20-30 mL, Intracatheter, Q1 MIN PRN  NS flush syringe, 10-30 mL, Intracatheter, Q8HRS  NS flush syringe, 20-30 mL, Intracatheter, Q1 MIN PRN  ondansetron (ZOFRAN) 2 mg/mL injection, 4 mg, Intravenous, Q6H PRN  oxyCODONE (ROXICODONE) immediate release tablet, 5 mg, Oral, Q4H PRN  oxyCODONE (ROXICODONE) immediate releaste tablet, 10 mg, Oral, Q4H PRN  pantoprazole (PROTONIX) injection, 40 mg, Intravenous, Q12H   And  [START ON 04/18/2021] pantoprazole (PROTONIX) delayed release tablet, 40 mg, Oral, Q12H  sennosides-docusate sodium (SENOKOT-S) 8.6-50mg per tablet, 1 Tablet, Oral, 2x/day  sodium phosphate 20 mmol in D5W 100 mL IVPB, 20 mmol, Intravenous, Once  sodium phosphate 30 mmol in D5W 100 mL IVPB, 30 mmol, Intravenous, Once  SSIP insulin R human (HUMULIN R) 100 units/mL injection, 0-12 Units, Subcutaneous, Q6H PRN        MEDICATION ALLERGIES  No Known Allergies        ASSESSMENT & PLAN  76 y.o. male with known recent DVT in the right popliteal vein and nonocclusive  right segmental pulmonary emboli. Heparin was stopped this AM due to bloody bowel movement and concern for GI bleed. He has recent hemorrhage s/p abdominal surgery 04/03/21.     1 - IVC filter placement  -  Time to be determined  - NPO tonight in case schedule allows for add on tomorrow  - Continue holding anticoagulation     SEDATION: moderate  ASA Classification (I-V): 2  Mallampati Airway Classifications (I-IV): 2    Discussed with IR attending and approved    Contact IR resident with patient status updates: Pager [phone]  Contact IR charge nurse for questions regarding scheduling: Phone [phone]    Timoteo Expose, MD  04/15/2021, 11:51  I have discussed the case with the resident and agree with the documentation and plan as discussed above.  Will coordinate IVC filter placement.      - If/when patient resumes anticoagulation, they should be sent back to IR for filter retreival    J. Andree Coss, MD  [facility] Attending  Dept of Radiology      04/15/2021, 13:02

## 2021-04-15 NOTE — Care Plan (Signed)
[employer]  Occupational Therapy Progress Note    Patient Name: [name]  Date of Birth: 08-13-1945  Height:  175.3 cm ('5\' 9"'$ )  Weight:  84.4 kg (186 lb 1.1 oz)  Room/Bed: 14/A  Payor: MEDICARE / Plan: MEDICARE PART A AND B / Product Type: Medicare /     Assessment:    Pt tolerated OT session at b/s on 8NE.  Well motivated to participate in therapy.  Spouse present and supportive.  Pt able to tolerated edge of bed sitting, static standing, and to take a few steps to mobilize OOB to chair with assist.  Continue to recommend inpt rehab once medically stable to facilitate safe d/c home.      Discharge Needs:   Equipment Recommendation: to be determined      Discharge Disposition:  inpatient rehabilitation facility     JUSTIFICATION OF DISCHARGE RECOMMENDATION   Based on current diagnosis, functional performance prior to admission, and current functional performance, this patient requires continued OT services in inpatient rehabilitation facility in order to achieve significant functional improvements.    Plan:   Continue to follow patient according to established plan of care.  The risks/benefits of therapy have been discussed with the patient/caregiver and he/she is in agreement with the established plan of care.     Subjective & Objective:      04/15/21 8299   Therapist Pager   OT Assigned/ Pager # Jondavid Schreier [phone]   Rehab Session   Document Type therapy progress note (daily note)   Total OT Minutes: 31   Patient Effort good   Symptoms Noted During/After Treatment dizziness;fatigue   General Information   Patient Profile Reviewed yes   Medical Lines PIV Line;Telemetry;Peripheral Drain   Respiratory Status room air   Existing Precautions/Restrictions fall precautions;full code   Pre Treatment Status   Pre Treatment Patient Status Patient supine in bed;Call light within reach;Telephone within reach;Sitter select activated;Nurse approved session;Venodynes in place and  activated   Support Present Pre Treatment  Family present   Communication Pre Treatment  Nurse   Mutuality/Individual Preferences   Individualized Care Needs OOB to recliner via Denna Haggard with assist   Plan of Care Reviewed With patient;spouse   Vital Signs   Pre SpO2 (%) 91   O2 Delivery Pre Treatment room air   Post SpO2 (%) 91   O2 Delivery Post Treatment room air   Pain Assessment   Posttreatment Pain Rating 4/10   Coping/Psychosocial   Observed Emotional State calm;cooperative   Verbalized Emotional State acceptance   Family/Support System   Family/Support Persons spouse   Involvement in Care at bedside;attentive to patient;interacting with patient;supportive of patient   Coping/Psychosocial Response Interventions   Plan Of Care Reviewed With patient;spouse   Trust Relationship/Rapport care explained;choices provided;questions encouraged;questions answered;reassurance provided;thoughts/feelings acknowledged   Cognitive Assessment/Interventions   Behavior/Mood Observations alert;cooperative   Orientation Status oriented x 4   Attention WNL/WFL   Follows Commands WNL   Mobility Assessment/Training   Additional Documentation Bed Mobility Assessment/Treatment (Group)   Bed Mobility Assessment/Treatment   Bed Mobility, Assistive Device Head of Bed Elevated   Roll Right Independence minimum assist (75% patient effort);verbal cues required   Sidelying to Sit, Independence minimum assist (75% patient effort);verbal cues required   Transfer Assessment/Treatment   Sit-Stand Independence minimum assist (75% patient effort);2 person assist required;verbal cues required   Stand-Sit Independence minimum assist (75% patient effort);2 person assist required;verbal cues required   Sit-Stand-Sit, Assist  Device side by side   Bed-Chair Independence minimum assist (75% patient effort);2 person assist required;verbal cues required   Bed-Chair-Bed Assist Device side by side   Transfer Impairments balance impaired;endurance;postural  control impaired;strength decreased   Lower Body Dressing Assessment/Training   Independence Level  dependent (less than 25% patient effort)   Comment slipper socks   Balance Skill Training   Sitting Balance: Static fair + balance   Sitting, Dynamic (Balance) fair - balance   Sit-to-Stand Balance fair - balance   Standing Balance: Static fair - balance   Standing Balance: Dynamic poor + balance   Therapeutic Exercise/Activity   Comment tolerates edge of bed sitting at least 6-8 min, static standing at least 2 min with assist, able to take a few steps from bed to chair with minA of 2   Post Treatment Status   Post Treatment Patient Status Patient sitting in bedside chair or w/c;Call light within reach;Telephone within reach;Sitter select activated;Venodynes in place and activated   Support Present Post Treatment  Family present   Financial trader Nurse   Communication Post Treatment Comment pt performance   Clinical Impression   Functional Level at Time of Session Pt tolerated OT session at b/s on 8NE.  Well motivated to participate in therapy.  Spouse present and supportive.  Pt able to tolerated edge of bed sitting, static standing, and to take a few steps to mobilize OOB to chair with assist.  Continue to recommend inpt rehab once medically stable to facilitate safe d/c home.   Anticipated Equipment Needs at Discharge to be determined   Anticipated Discharge Disposition inpatient rehabilitation facility   Highest level of Mobility score   Exercise/Activity Level Performed 5- Static standing>1 minute         Therapist:   Forbes Cellar, OT  Pager #: [phone]

## 2021-04-16 ENCOUNTER — Encounter (HOSPITAL_COMMUNITY)
Admission: AD | Disposition: A | Discharge status: Rehabilitation Facility | Payer: Self-pay | Source: Other Acute Inpatient Hospital | Attending: SURGICAL ONCOLOGY

## 2021-04-16 DIAGNOSIS — I82409 Acute embolism and thrombosis of unspecified deep veins of unspecified lower extremity: Secondary | ICD-10-CM

## 2021-04-16 LAB — BASIC METABOLIC PANEL
ANION GAP: 5 mmol/L (ref 4–13)
BUN/CREA RATIO: 17 (ref 6–22)
BUN: 11 mg/dL (ref 8–25)
CALCIUM: 7.8 mg/dL — ABNORMAL LOW (ref 8.8–10.2)
CHLORIDE: 104 mmol/L (ref 96–111)
CO2 TOTAL: 25 mmol/L (ref 23–31)
CREATININE: 0.65 mg/dL — ABNORMAL LOW (ref 0.75–1.35)
ESTIMATED GFR: >90 mL/min/BSA (ref >=60)
GLUCOSE: 158 mg/dL — ABNORMAL HIGH (ref 65–125)
POTASSIUM: 4 mmol/L (ref 3.5–5.1)
SODIUM: 134 mmol/L — ABNORMAL LOW (ref 136–145)

## 2021-04-16 LAB — CBC
HCT: 22.3 % — ABNORMAL LOW (ref 38.9–52.0)
HGB: 7.1 g/dL — ABNORMAL LOW (ref 13.4–17.5)
MCH: 29 pg (ref 26.0–32.0)
MCHC: 31.8 g/dL (ref 31.0–35.5)
MCV: 91 fL (ref 78.0–100.0)
MPV: 10.9 fL (ref 8.7–12.5)
PLATELETS: 759 10*3/uL — ABNORMAL HIGH (ref 150–400)
RBC: 2.45 10*6/uL — ABNORMAL LOW (ref 4.50–6.10)
RDW-CV: 16.6 % — ABNORMAL HIGH (ref 11.5–15.5)
WBC: 21.2 10*3/uL — ABNORMAL HIGH (ref 3.7–11.0)

## 2021-04-16 LAB — PHOSPHORUS: PHOSPHORUS: 2 mg/dL — ABNORMAL LOW (ref 2.3–4.0)

## 2021-04-16 LAB — WOUND, SUPERFICIAL/NON-STERILE SITE, AEROBIC CULTURE AND GRAM STAIN

## 2021-04-16 LAB — POC BLOOD GLUCOSE (RESULTS)
GLUCOSE, POC: 194 mg/dl — ABNORMAL HIGH (ref 70–105)
GLUCOSE, POC: 176 mg/dl — ABNORMAL HIGH (ref 70–105)
GLUCOSE, POC: 161 mg/dl — ABNORMAL HIGH (ref 70–105)
GLUCOSE, POC: 157 mg/dl — ABNORMAL HIGH (ref 70–105)

## 2021-04-16 LAB — H & H
HCT: 23.4 % — ABNORMAL LOW (ref 38.9–52.0)
HGB: 7.3 g/dL — ABNORMAL LOW (ref 13.4–17.5)

## 2021-04-16 LAB — ADULT ROUTINE BLOOD CULTURE, SET OF 2 BOTTLES (BACTERIA AND YEAST): BLOOD CULTURE, ROUTINE: NO GROWTH

## 2021-04-16 LAB — MAGNESIUM: MAGNESIUM: 2.1 mg/dL (ref 1.8–2.6)

## 2021-04-16 SURGERY — IR IVC PERC VENA CAVA FILTER PLACEMENT

## 2021-04-16 MED ORDER — MIDAZOLAM 1 MG/ML INJECTION SOLUTION
INTRAMUSCULAR | Status: AC
Start: 2021-04-16 — End: 2021-04-16
  Filled 2021-04-16: qty 2

## 2021-04-16 MED ORDER — FENTANYL (PF) 50 MCG/ML INJECTION SOLUTION
INTRAMUSCULAR | Status: AC
Start: 2021-04-16 — End: 2021-04-16
  Filled 2021-04-16: qty 2

## 2021-04-16 MED ORDER — LIDOCAINE HCL 10 MG/ML (1 %) INJECTION SOLUTION
Freq: Once | INTRAMUSCULAR | Status: DC | PRN
Start: 2021-04-16 — End: 2021-04-16
  Administered 2021-04-16: 7 mL

## 2021-04-16 MED ORDER — IOPAMIDOL 300 MG IODINE/ML (61 %) INTRAVENOUS SOLUTION
Freq: Once | INTRAVENOUS | Status: DC | PRN
Start: 2021-04-16 — End: 2021-04-16
  Administered 2021-04-16: 30 mL via INTRAVENOUS

## 2021-04-16 MED ORDER — FENTANYL (PF) 50 MCG/ML INJECTION SOLUTION
Freq: Once | INTRAMUSCULAR | Status: DC | PRN
Start: 2021-04-16 — End: 2021-04-16
  Administered 2021-04-16: 50 ug via INTRAVENOUS
  Administered 2021-04-16: 25 ug via INTRAVENOUS
  Administered 2021-04-16 (×3): 50 ug via INTRAVENOUS
  Administered 2021-04-16: 25 ug via INTRAVENOUS

## 2021-04-16 MED ORDER — SODIUM PHOSPHATE 3 MMOL/ML INTRAVENOUS SOLUTION
30.0000 mmol | Freq: Once | INTRAVENOUS | Status: AC
Start: 2021-04-16 — End: 2021-04-16
  Administered 2021-04-16: 30 mmol via INTRAVENOUS
  Administered 2021-04-16: 0 mmol via INTRAVENOUS
  Filled 2021-04-16: qty 10

## 2021-04-16 MED ORDER — ELECTROLYTE-A INTRAVENOUS SOLUTION
INTRAVENOUS | Status: DC
Start: 2021-04-16 — End: 2021-04-19
  Administered 2021-04-19: 0 mL via INTRAVENOUS

## 2021-04-16 MED ORDER — LIDOCAINE HCL 10 MG/ML (1 %) INJECTION SOLUTION
INTRAMUSCULAR | Status: AC
Start: 2021-04-16 — End: 2021-04-16
  Filled 2021-04-16: qty 20

## 2021-04-16 MED ORDER — WATER FOR INJECTION, STERILE INTRAVENOUS SOLUTION
INTRAVENOUS | Status: AC
Start: 2021-04-16 — End: 2021-04-17
  Filled 2021-04-16: qty 700

## 2021-04-16 MED ORDER — INSULIN REGULAR HUMAN 100 UNIT/ML INJECTION SSIP
0.0000 [IU] | INJECTION | Freq: Four times a day (QID) | SUBCUTANEOUS | Status: DC | PRN
Start: 2021-04-16 — End: 2021-04-20
  Administered 2021-04-16 – 2021-04-17 (×4): 3 [IU] via SUBCUTANEOUS
  Administered 2021-04-17: 6 [IU] via SUBCUTANEOUS
  Administered 2021-04-17 – 2021-04-18 (×5): 3 [IU] via SUBCUTANEOUS
  Administered 2021-04-18: 6 [IU] via SUBCUTANEOUS
  Administered 2021-04-19: 3 [IU] via SUBCUTANEOUS
  Administered 2021-04-19 – 2021-04-20 (×2): 6 [IU] via SUBCUTANEOUS

## 2021-04-16 MED ORDER — MIDAZOLAM (PF) 1 MG/ML INJECTION SOLUTION
Freq: Once | INTRAMUSCULAR | Status: DC | PRN
Start: 2021-04-16 — End: 2021-04-16
  Administered 2021-04-16 (×2): .5 mg via INTRAVENOUS
  Administered 2021-04-16: 1 mg via INTRAVENOUS

## 2021-04-16 MED ORDER — SODIUM PHOSPHATE 3 MMOL/ML INTRAVENOUS SOLUTION
20.0000 mmol | Freq: Once | INTRAVENOUS | Status: DC
Start: 2021-04-16 — End: 2021-04-17
  Administered 2021-04-16: 0 mmol via INTRAVENOUS
  Filled 2021-04-16: qty 6.67

## 2021-04-16 SURGICAL SUPPLY — 7 items
CONV USE 338045 - PACK SURG CSTM RAD (MED SURG SUPPLIES) ×1 IMPLANT
FILTER IVC 70CM DEL SHEATH CURVE PSHR STRL LF  32- MM 5FR OPTN ELT FEM JUG ACCESS (IMPLANTS CARDIOTHORACIC) ×1 IMPLANT
GW URO .035IN 145CM 3.5CM AMPLATZSS STR FLX TP FLTWR OUTR COIL PTFE LF (UROLOGICAL SUPPLIES) ×1 IMPLANT
GW URO .035IN 145CM 3.5CM AMPL_TZSS STR FLX TP FLTWR OUTR (UROLOGICAL SUPPLIES) ×1
KIT MICROINTRO MNSTCK 7CM MAX 4FR 21GA TUNG NITINOL COAX GW ECHGN NEEDLE STRL DISP (VASCULAR) ×1 IMPLANT
KIT MICROINTRO MNSTCK 7CM MAX_4FR 21GA TUNG NITINOL COAX GW (VASCULAR) ×1
PACK RADIOLOGY ANGIOGRAPHY (MED SURG SUPPLIES) ×1

## 2021-04-16 NOTE — Progress Notes (Signed)
[facility]  Surgical Oncology  Progress Note      [name], [name], 76 y.o. male  Date of Birth:  31-Aug-1945  Date of Admission:  04/03/2021  Date of service: 04/16/2021    Assessment:  This is a 76 y.o. 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 He underwent massive transfusion protocol and ex lap on 04/03/21 at an outside facility with ligation of a bleeding vessel in the liver bed. Patient was subsequently transferred to [facility] postoperaively, intubated. Patient was subsequently extubated later that day on 3/4. 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      Plan/Recommendations:  - Bloody BM this am   - Repeat H+H this afternoon, stable 7.3   - Heparin held   - IVC filter today  - Diet: NPO   - Patient underwent drain placement & emobolization with IR on 3/10         - JP continues to drain pancreatic fluid               -- Drain triglyceride 1,873  - Peripancreatic fluid collections              - IR consulted; drain placement 3/10              - Blood cultures 3/9: + pseudomonas    - Cultures form R JP: Pseudomonas and enterococcus              - Repeat Blood cultures 3/12 negative   - ID consulted; continue Daptomycin and meropenum (stop date 4/7)  - Continue TPN  - PE   - IVC placement today   - Encouraged IS and ambulation   - Aggressive pulm toilet   - Wean O2   - Midline fistula with ostomy bag added to monitor output  - OOB TID  - PT/OT ordered    Subjective:   Bloody BM overnight. Pain well controlled, denies N/V, CP or SOB. IVC filter today.     Objective  Filed Vitals:    04/16/21 0745 04/16/21 0750 04/16/21 1115 04/16/21 1122   BP: 132/77  137/77    Pulse: 94  (!) 109    Resp: 20      Temp:  36.8 C (98.2 F)  36.7 C (98.1 F)   SpO2: 95%  95%      Physical Exam:   GEN:  AOx4, resting in bed, no  acute distress  PULM: Normal respiratory effort.   CV:  Tachycardic, regular rhythm   ABD:   Abdomen soft, minimal ttp RUQ, nondistended. JP in place in RUQ with light brown output,  Lower midline incision with feculent drainage present with ostomy bag over it.  Pigtail catheter present with SS output.  MS: Atraumatic. Moves all extremities.  NEURO:   Alert and oriented to person, place and time.  Cranial nerves grossly intact.    Integumentary:  Pink, warm, and dry  PSYCHOSOCIAL: Pleasant.  Normal affect.     Labs  Results for orders placed or performed during the hospital encounter of 04/03/21 (from the past 24 hour(s))   BASIC METABOLIC PANEL   Result Value Ref Range    SODIUM 134 (L) 136 - 145 mmol/L    POTASSIUM 4.0 3.5 - 5.1 mmol/L    CHLORIDE 104 96 -  111 mmol/L    CO2 TOTAL 25 23 - 31 mmol/L    ANION GAP 5 4 - 13 mmol/L    CALCIUM 7.8 (L) 8.8 - 10.2 mg/dL    GLUCOSE 158 (H) 65 - 125 mg/dL    BUN 11 8 - 25 mg/dL    CREATININE 0.65 (L) 0.75 - 1.35 mg/dL    BUN/CREA RATIO 17 6 - 22    ESTIMATED GFR >90 >=60 mL/min/BSA   CBC   Result Value Ref Range    WBC 21.2 (H) 3.7 - 11.0 x10^3/uL    RBC 2.45 (L) 4.50 - 6.10 x10^6/uL    HGB 7.1 (L) 13.4 - 17.5 g/dL    HCT 22.3 (L) 38.9 - 52.0 %    MCV 91.0 78.0 - 100.0 fL    MCH 29.0 26.0 - 32.0 pg    MCHC 31.8 31.0 - 35.5 g/dL    RDW-CV 16.6 (H) 11.5 - 15.5 %    PLATELETS 759 (H) 150 - 400 x10^3/uL    MPV 10.9 8.7 - 12.5 fL   MAGNESIUM   Result Value Ref Range    MAGNESIUM 2.1 1.8 - 2.6 mg/dL   PHOSPHORUS   Result Value Ref Range    PHOSPHORUS 2.0 (L) 2.3 - 4.0 mg/dL   H & H   Result Value Ref Range    HGB 7.3 (L) 13.4 - 17.5 g/dL    HCT 23.4 (L) 38.9 - 52.0 %   POC BLOOD GLUCOSE (RESULTS)   Result Value Ref Range    GLUCOSE, POC 179 (H) 70 - 105 mg/dl   POC BLOOD GLUCOSE (RESULTS)   Result Value Ref Range    GLUCOSE, POC 194 (H) 70 - 105 mg/dl   POC BLOOD GLUCOSE (RESULTS)   Result Value Ref Range    GLUCOSE, POC 176 (H) 70 - 105 mg/dl   POC BLOOD GLUCOSE (RESULTS)    Result Value Ref Range    GLUCOSE, POC 161 (H) 70 - 105 mg/dl       I/O:  Date 04/15/21 0700 - 04/16/21 0659 04/16/21 0700 - 04/17/21 0659   Shift 0700-1459 1500-2259 2300-0659 24 Hour Total 0700-1459 1500-2259 2300-0659 24 Hour Total   INTAKE   P.O.  60  60         Oral  60  60       I.V.(mL/kg/hr) 372.3(0.55) 1354(2.01) 908(1.34) 2634.3(1.3) 299.7   299.7     Med (IV) Flush Volume  '10 20 30         '$ Heparin Volume 39.3   39.3         Volume Infused (electrolyte-A (PLASMALYTE-A) premix infusion) 225 [phone] 202.5   202.5     Volume Infused (adult custom parenteral nutrition) 108 314.4  422.4         Volume Infused (adult custom parenteral nutrition)  54.6 288 342.6 97.2   97.2   IV Piggyback 178 108  286 112   112     Volume (sodium phosphate 30 mmol in D5W 100 mL IVPB) 112   112         Volume (sodium phosphate 20 mmol in D5W 100 mL IVPB) 0 108  108         Volume (DAPTOmycin (CUBICIN) 800 mg in NS 50 mL IVPB) 66   66         Volume (sodium phosphate 30 mmol in D5W 100 mL IVPB)     112   112  Shift Total(mL/kg) 550.3(6.52) 8295(62.13) 086(57.84) 2980.3(35.31) 411.7(4.88)   411.7(4.88)   OUTPUT   Urine(mL/kg/hr)  300(0.44) 800(1.18) 1100(0.54) 950   950     Urine (Voided)  [phone] 950   950     Urine Occurrence 300 x   300 x       Emesis             Emesis Occurrence  0 x  0 x       Drains 250 [phone] 220.5   220.5     JP Drain Output ([place] Drain Right;Upper Abdomen) 0 10 0 10 0.5   0.5     Drain Output (Drain (Miscellaneous) #76F pigtail catheter for RLQ fluid collection Lower;Right;Anterior Abdomen) 150 370 350 870 170   170     Drain Output (Drain (Miscellaneous) peds ostomy bag Lower;Medial Abdomen) 100 50 125 275 50   50   Stool             Stool Occurrence  2 x  2 x       Shift Total(mL/kg) 250(2.96) 730(8.65) 1275(15.11) 6962(95.28) 1170.5(13.87)   1170.5(13.87)   Weight (kg) 84.4 84.4 84.4 84.4 84.4 84.4 84.4 84.4       Radiology       Current Medications:  Current  Facility-Administered Medications   Medication Dose Route Frequency    [MAR Hold] acetaminophen (TYLENOL) tablet  650 mg Oral Q4H    [MAR Hold] adult custom parenteral nutrition   Intravenous Continuous    [MAR Hold] adult custom parenteral nutrition   Intravenous Continuous    [MAR Hold] atorvastatin (LIPITOR) tablet  40 mg Oral Daily with Breakfast    [MAR Hold] benzonatate (TESSALON) capsule  100 mg Oral Q8H PRN    [MAR Hold] D5W 250 mL flush bag   Intravenous Q15 Min PRN    [MAR Hold] DAPTOmycin (CUBICIN) 800 mg in NS 50 mL IVPB  10 mg/kg (Adjusted) Intravenous Q24H    [MAR Hold] docusate sodium (COLACE) capsule  100 mg Oral 2x/day    [MAR Hold] electrolyte-A (PLASMALYTE-A) premix infusion   Intravenous Continuous    [MAR Hold] elvitegravir-cobicistat-emtricitrabine-tenofovir (GENVOYA) 150-150-200-10 mg per tablet  1 Tablet Oral Daily    [MAR Hold] gabapentin (NEURONTIN) capsule  300 mg Oral 3x/day    [Held by provider] heparin 25,000 units in NS 250 mL infusion  18 Units/kg/hr (Adjusted) Intravenous Continuous    [MAR Hold] HYDROmorphone (DILAUDID) 1 mg/mL injection  0.2 mg Intravenous Q3H PRN    [MAR Hold] meropenem (MERREM) 1 g in NS 100 mL IVPB  1 g Intravenous Q8H    [MAR Hold] methocarbamol (ROBAXIN) tablet  500 mg Oral 4x/day    [MAR Hold] multivitamin-minerals-folic acid-lycopene-lutein (CERTAVITE SENIOR) tablet  1 Tablet Oral Q24H    [MAR Hold] NS 250 mL flush bag   Intravenous Q15 Min PRN    [MAR Hold] NS flush syringe  2-6 mL Intracatheter Q8HRS    [MAR Hold] NS flush syringe  2-6 mL Intracatheter Q1 MIN PRN    [MAR Hold] NS flush syringe  10-30 mL Intracatheter Q8HRS    [MAR Hold] NS flush syringe  20-30 mL Intracatheter Q1 MIN PRN    [MAR Hold] NS flush syringe  10-30 mL Intracatheter Q8HRS    [MAR Hold] NS flush syringe  20-30 mL Intracatheter Q1 MIN PRN    [MAR Hold] ondansetron (ZOFRAN) 2 mg/mL injection  4 mg Intravenous Q6H PRN    [MAR Hold] oxyCODONE (  ROXICODONE) immediate release tablet  5 mg  Oral Q4H PRN    [MAR Hold] oxyCODONE (ROXICODONE) immediate releaste tablet  10 mg Oral Q4H PRN    [MAR Hold] pantoprazole (PROTONIX) injection  40 mg Intravenous Q12H    And    [MAR Hold] pantoprazole (PROTONIX) delayed release tablet  40 mg Oral Q12H    [MAR Hold] sennosides-docusate sodium (SENOKOT-S) 8.6-'50mg'$  per tablet  1 Tablet Oral 2x/day    [MAR Hold] sodium phosphate 20 mmol in D5W 100 mL IVPB  20 mmol Intravenous Once    [MAR Hold] SSIP insulin R human (HUMULIN R) 100 units/mL injection  0-12 Units Subcutaneous Q6H PRN       Stacey Drain, DO    PGY-1  General Surgery  Pager # [phone]      I saw and examined the patient.  I reviewed the resident's note.  I agree with the findings and plan of care as documented in the resident's note.  Any exceptions/additions are edited/noted.    Aaron Edelman A. Cyndi Bender, MD, FACS

## 2021-04-16 NOTE — Nurses Notes (Signed)
Pt transported to IR with PMT/transport. Patient VSS and patient on 1LNC. Hearing aids left at bedside.

## 2021-04-16 NOTE — Brief Op Note (Signed)
[facility]   Brief Post-Interventional Radiology Procedure Note    Patient Name: [facility] Number: [id number]  Date of Birth: 1945-10-08  Date of Service: 04/16/2021    PROCEDURE: IVC filter placement  Attending: Honor Loh, MD  Assistant: [place]i Brady, DO  Preop Diagnosis: DVT  Postop Diagnosis: Same  Access: Right IJ; 6Fr sheath.  Closure: Manual pressure  Anesthesia: Moderate sedation  Estimated Blood Loss: None  Specimens Removed: None    FINDINGS/INTERVENTIONS/COMPLICATIONS:   - Successful placement of IVC filter.  - No acute complications.     RECOMMENDATIONS & FOLLOW-UP:   - Full dictation to follow.       Mali K. Brady, DO  Diagnostic Radiology, PGY-5 ESIR      I was present for the entire surgical procedure. I have reviewed the images and confirmed or revised the interpretation as documented by the resident.    Kerney Elbe, MD 04/16/2021, 15:47

## 2021-04-16 NOTE — Care Plan (Signed)
04/16/21 1433   Therapist Pager   OT Assigned/ Pager # Jennel Mara [phone]   Rehab Session   Document Type rehab contact note   Total OT Minutes: 0   Daily Activity AM-PAC/6-clicks Score   Patient Mobility Barrier Patient off floor for procedure/test  (pt in IR)

## 2021-04-16 NOTE — Sedation Documentation (Signed)
04/16/21  Procedure(s):  IR IVC PERC VENA CAVA FILTER PLACEMENT    Diagnosis:     Sedation Informed Consent, pre-sedation risk assessment and evaluation completed.  History of previous adverse experiences with sedation/analgesia/anesthesia assessed.  Monitored conscious sedation was administered under my direct supervision by an appropriately trained sedation nurse.  Appropriate Facility and Equipment compliant.      Procedure time out  Timeouts       Ladon Applebaum, RN at Fri Apr 16, 2021 1438 EDT       Timeout Details       Timeout type: Preprocedure              Procedures       Panel 1: IR IVC PERC VENA CAVA FILTER PLACEMENT with Kerney Elbe, MD              Timeout Questions    Correct patient? Yes  Correct site? Yes  Correct side? Yes  Correct position? Yes  Correct procedure? Yes  Site marked? Yes  H&P note completed? Yes  Consents verified? Yes  Radiology studies available? Yes  Relevant lab results available? Yes  Allergies reviewed? Yes  Are all required blood products & devices for the procedure available? Yes  Is documentation verified? Yes             Staff Present       Physicians  Kerney Elbe, MD Staff  [place], [place], [facility]  Babs Sciara, [place]  Ladon Applebaum, RN  Eric Form, RTR              Signing History       Staff Performed Signed    Ladon Applebaum, RN Fri Apr 16, 2021 1438 EDT Fri Apr 16, 2021 1447 EDT                            Physician in  and out times  Physicians       Name Panel Role Time Period    Kerney Elbe, MD Panel 1 Primary 04/16/2021 [place], [place], DO Panel 1 Resident - Assisting 04/16/2021 1437          Sedation Staff    None         Sedation and Procedure Times:  Sedation Start Time:: 0301  Sedation End/Recovery Start Time: 1528     Proc Name Event Type Event Time    IR IVC [facility] VENA CAVA FILTER PLACEMENT  Incision Start Fri Apr 16, 2021  2:46 PM    IR IVC PERC VENA CAVA FILTER PLACEMENT  Incision Close             Aldrete Scores    Pre  Sedation  Activity: 2-->able to move 4 extremities voluntarily or on command (weak movement, deconditioned)  Respiration: 2-->able to breathe and cough freely  Circulation: 2-->BP within 20% of pre-anesthetic level  Consciousness: 2-->fully awake  O2 Saturation: 2-->able to maintain O2 saturation greater than 92% on room air  Dressing: 2-->dry and clean or not applicable  Pain: 0-->severe pain requiring parenteral medication (chest 6-10/10 depending on acitivity and position.)  Ambulation: 2-->able to stand up and walk straight, on ordered bedrest, or performing at previous level of functioning  Fasting/Feeding: 2-->able to drink fluids or NPO, minimal nausea/ no vomiting  Urine Output: 2-->has voided, adequate urine output per device, or not applicable  Modified Aldrete Score: 18  Post Sedation  Assessment Scored:: Post-Procedure  Activity: 2-->able to move 4 extremities voluntarily or on command  Respiration: 2-->able to breathe and cough freely  Circulation: 2-->BP within 20% of pre-anesthetic level  Consciousness: 1-->arousable on calling  O2 Saturation: 1-->needs O2 inhalation to maintain O2 saturation greater than 90%  Dressing: 2-->dry and clean or not applicable  Pain: 2-->pain free  Ambulation: 2-->able to stand up and walk straight, on ordered bedrest, or performing at previous level of functioning  Urine Output: 2-->has voided, adequate urine output per device, or not applicable (voided large amount during procedure, could not wait for urinal)  Post Modified Aldrete Score: 18      Sedation Type: Moderate Sedation     Medications (moderate): Fentanyl, Versed  Hospital: [facility]  Unit: Interventional Radiology  IV Type: Peripheral IV  Additional Intervention needed:: No     Effects of Administration: Successful sedation w/o adverse events       Patient was continuously monitored throughout the procedure.  Provider was in attendance throughout sedation.  See Invasive Procedure Log for additional  details.    Mali Brady, DO                     The patient was continuously monitored throughout the procedure and in recovery. I was in attendance and supervised the sedation (during the start and stop times listed above) and remained immediately available until the patient returned to pre-procedure baseline.       Warren Lacy, MD  VIR Attending  Dept of Radiology      04/16/2021, 15:48

## 2021-04-16 NOTE — Care Management Notes (Signed)
[place] Management Note    Patient Name: [name]  Date of Birth: 1945/11/08  Sex: male  Date/Time of Admission: 04/03/2021  2:01 PM  Room/Bed: Interventional [facility]/NONE  Payor: MEDICARE / Plan: MEDICARE PART A AND B / Product Type: Medicare /    LOS: 13 days   Primary Care Providers:  [facility]ubert (General)    Admitting Diagnosis:  Intra abdominal hemorrhage [R58]    Assessment:      04/16/21 1545   Assessment Details   Assessment Type Continued Assessment   Date of Care Management Update 04/16/21   Date of Next DCP Update 04/19/21   Care Management Plan   Discharge Planning Status plan in progress   Projected Discharge Date 04/19/21   Discharge plan discussed with: Patient   Discharge Needs Assessment   Discharge Facility/Level of Care Needs Acute Rehab Placement/Return (not psych)(code 62)     Per chart review, repeat H+H this afternoon, stable 7.3, Heparin held, IVC filter today, JP continues to drain pancreatic fluid, drain triglyceride 1,873, ID consulted; continue Daptomycin and meropenum (stop date 4/7), continue TPN, PE; IVC placement today, encouraged IS and ambulation, aggressive pulm toilet, wean O2, Midline fistula with ostomy bag added to monitor output, PT/OT ordered; recs IPR on DC.  Will follow for DC planning.    Discharge Plan:  Acute Rehab Placement/Return (not psych) (code 45)    The patient will continue to be evaluated for developing discharge needs.     Case Manager: Darron Doom., SOCIAL WORKER  Phone: [phone]

## 2021-04-16 NOTE — Care Plan (Signed)
Medical Nutrition Therapy NPO        SUBJECTIVE : pt remains NPO on TPN    OBJECTIVE:     Current Diet Order/Nutrition Support:  NPO plus TPN: 900 cal (12 cal/kg), 50 g prot, 430 dextrose cal, 270 lipids at 42 ml/hr    Height Used for Calculations: 175.3 cm (5' 9.02")  Weight Used For Calculations: 75.9 kg (167 lb 5.3 oz) (standing weight on 3/3)  Weight trends: 3/14- 84.4 kg (bed)   BMI (kg/m2): 24.75  BMI Assessment: BMI 18.5-24.9: normal  Ideal Body Weight (IBW) (kg): 73.73  % Ideal Body Weight: 102.94     Usual Body Weight: 81.3 kg (179 lb 3.7 oz)  Weight Loss:  (intentional weight loss)      Estimated Needs:    Energy Calorie Requirements: 2100-2300 cal (28-30 cal/75.9 kg)  Protein Requirements (gms/day): 88-110 g prot (1.2-1.5 g/73.7 kg)   Fluid Requirements: 2100-2300 ml (28-30 ml/75.9 k)       Comments: 76 y.o. 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 He underwent massive transfusion protocol and ex lap on 04/03/21 at an outside facility with ligation of a bleeding vessel in the liver bed. Patient was subsequently transferred to [place] postoperaively, intubated. Patient was subsequently extubated later that day on 3/4. 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    Latest Reference Range & Units 03/17/21 09:17   HEMOGLOBIN A1C <5.7 % 9.3 (H)        Latest Reference Range & Units 04/15/21 17:31 04/16/21 01:05 04/16/21 06:18   GLUCOSE, POC 70 - 105 mg/dl 179 (H) 194 (H) 176 (H)   (H): Data is abnormally high     Latest Reference Range & Units 04/12/21 00:33   TRIGLYCERIDES <150 mg/dL 91      Latest Reference Range & Units 04/13/21 00:27 04/14/21 05:42 04/15/21 04:14 04/16/21 05:03   PHOSPHORUS 2.3 - 4.0 mg/dL 1.8 (L) 1.7 (L) 1.9 (L) 2.0 (L)   (L): Data is abnormally low      Plan/Interventions :   Pt has shown signs of re-feeding  and have been unable to advance TPN, phos is slightly improved but still low so will increase TPN slightly to 1100 cal, 70 g prot, 490 dextrose cal, 330 lipids at ~46    100 mg thiamine x 5 days and re-evaluate the need  Oral multivitamin/mineral due to IV shortage    Monitor potassium, magnesium and phosphorus daily along with blood sugars and BMP and will advance TPN to goal as able (slowly by 200-400 calories at a time if signs of refeeding)     1st advancement suggestion: 1400 cal, 85 g prot, 640 dextrose cal, 420 lipids at ~57 ml/hr   2nd advancement suggestion: 1800 cal, 110 g prot, 820 dextrose cal, 540 lipids at ~72 ml/h    Third advancement to Goal  TPN  Rate: ~79 ml/hr  2200 kcal /day = 29 kcal/kg  110 grams Protein = 1.5 grams/ kg  1100 Dextrose kcals, GIR 3  660 IL kcals or 0.9 grams Fat/kg    Monitor LFTs and triglycerides weekly.   Monitor weekly weights.   Will continue to follow.         Nutrition Diagnosis: Altered GI function related to midline fistula draining stool as evidenced by need for TPN    Leda Quail  Domenic Polite, [place], LD, [place] 04/16/2021, 10:45  Pager [phone]

## 2021-04-16 NOTE — Nurses Notes (Signed)
Pt returned from IR via PMT/transport. Pt VSS and resting comfortably on audible sitter select with call bell near.

## 2021-04-17 LAB — BASIC METABOLIC PANEL
ANION GAP: 5 mmol/L (ref 4–13)
BUN/CREA RATIO: 16 (ref 6–22)
BUN: 10 mg/dL (ref 8–25)
CALCIUM: 7.8 mg/dL — ABNORMAL LOW (ref 8.8–10.2)
CHLORIDE: 102 mmol/L (ref 96–111)
CO2 TOTAL: 25 mmol/L (ref 23–31)
CREATININE: 0.64 mg/dL — ABNORMAL LOW (ref 0.75–1.35)
ESTIMATED GFR: >90 mL/min/BSA (ref >=60)
GLUCOSE: 141 mg/dL — ABNORMAL HIGH (ref 65–125)
POTASSIUM: 4.1 mmol/L (ref 3.5–5.1)
SODIUM: 132 mmol/L — ABNORMAL LOW (ref 136–145)

## 2021-04-17 LAB — CBC
HCT: 22.7 % — ABNORMAL LOW (ref 38.9–52.0)
HGB: 7.2 g/dL — ABNORMAL LOW (ref 13.4–17.5)
MCH: 29.3 pg (ref 26.0–32.0)
MCHC: 31.7 g/dL (ref 31.0–35.5)
MCV: 92.3 fL (ref 78.0–100.0)
MPV: 11 fL (ref 8.7–12.5)
PLATELETS: 713 10*3/uL — ABNORMAL HIGH (ref 150–400)
RBC: 2.46 10*6/uL — ABNORMAL LOW (ref 4.50–6.10)
RDW-CV: 17.1 % — ABNORMAL HIGH (ref 11.5–15.5)
WBC: 17.3 10*3/uL — ABNORMAL HIGH (ref 3.7–11.0)

## 2021-04-17 LAB — POC BLOOD GLUCOSE (RESULTS)
GLUCOSE, POC: 155 mg/dl — ABNORMAL HIGH (ref 70–105)
GLUCOSE, POC: 168 mg/dl — ABNORMAL HIGH (ref 70–105)
GLUCOSE, POC: 170 mg/dl — ABNORMAL HIGH (ref 70–105)
GLUCOSE, POC: 218 mg/dl — ABNORMAL HIGH (ref 70–105)

## 2021-04-17 LAB — MAGNESIUM: MAGNESIUM: 2 mg/dL (ref 1.8–2.6)

## 2021-04-17 LAB — PHOSPHORUS: PHOSPHORUS: 2.1 mg/dL — ABNORMAL LOW (ref 2.3–4.0)

## 2021-04-17 MED ORDER — WATER FOR INJECTION, STERILE INTRAVENOUS SOLUTION
INTRAVENOUS | Status: AC
Start: 2021-04-17 — End: 2021-04-18
  Filled 2021-04-17: qty 700

## 2021-04-17 MED ORDER — SODIUM PHOSPHATE 3 MMOL/ML INTRAVENOUS SOLUTION
20.0000 mmol | Freq: Once | INTRAVENOUS | Status: AC
Start: 2021-04-17 — End: 2021-04-17
  Administered 2021-04-17: 20 mmol via INTRAVENOUS
  Administered 2021-04-17: 0 mmol via INTRAVENOUS
  Filled 2021-04-17: qty 6.67

## 2021-04-17 NOTE — Progress Notes (Signed)
[facility]  Surgical Oncology  Progress Note      [name], [name], 76 y.o. male  Date of Birth:  March 06, 1945  Date of Admission:  04/03/2021  Date of service: 04/17/2021    Assessment:  This is a 76 y.o. 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 He underwent massive transfusion protocol and ex lap on 04/03/21 at an outside facility with ligation of a bleeding vessel in the liver bed. Patient was subsequently transferred to [facility] postoperaively, intubated. Patient was subsequently extubated later that day on 3/4. 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      Plan/Recommendations:  - Hematochezia   - Hemoglobin stable   - Continue holding heparin held  - Diet: CLD with TPN  - Patient underwent drain placement & emobolization with IR on 3/10         - JP continues to drain pancreatic fluid               -- Drain triglyceride 1,873  - Peripancreatic fluid collections              - IR consulted; drain placement 3/10              - Blood cultures 3/9: + pseudomonas    - Cultures form R JP: Pseudomonas and enterococcus              - Repeat Blood cultures 3/12 negative   - ID consulted; continue Daptomycin and meropenum (stop date 4/7)  - PE   - IVC filter placement 3/17   - Encouraged IS and ambulation   - Aggressive pulm toilet   - Wean O2   - Midline fistula with ostomy bag added to monitor output  - OOB TID  - PT/OT ordered    Subjective:   No bloody BMs overnight. Overall feels well this morning. Denies N/V, abd pain, and voices no complaints.     Objective  Filed Vitals:    04/17/21 0230 04/17/21 0245 04/17/21 0730 04/17/21 1100   BP: 133/74  128/71    Pulse: 95 96 99    Resp: (!) 11 (!) 11 16    Temp: 37.1 C (98.7 F)  36.7 C (98.1 F) 36.7 C (98 F)   SpO2: 95% 91% 92%      Physical Exam:   GEN:  AOx4,  resting in bed, no acute distress  PULM: Normal respiratory effort.   CV:  Tachycardic, regular rhythm   ABD:   Abdomen soft, minimal ttp RUQ, nondistended. JP in place in RUQ with minimal gray output,  Lower midline incision with feculent drainage present with ostomy bag over it.  Pigtail catheter present with SS output.  MS: Atraumatic. Moves all extremities.  NEURO:   Alert and oriented to person, place and time.  Cranial nerves grossly intact.    Integumentary:  Pink, warm, and dry  PSYCHOSOCIAL: Pleasant.  Normal affect.     Labs  Results for orders placed or performed during the hospital encounter of 04/03/21 (from the past 24 hour(s))   PHOSPHORUS   Result Value Ref Range    PHOSPHORUS 2.1 (L) 2.3 - 4.0 mg/dL    Narrative    Hemolysis can alter results at this level (slight).   CBC   Result Value Ref Range  WBC 17.3 (H) 3.7 - 11.0 x10^3/uL    RBC 2.46 (L) 4.50 - 6.10 x10^6/uL    HGB 7.2 (L) 13.4 - 17.5 g/dL    HCT 22.7 (L) 38.9 - 52.0 %    MCV 92.3 78.0 - 100.0 fL    MCH 29.3 26.0 - 32.0 pg    MCHC 31.7 31.0 - 35.5 g/dL    RDW-CV 17.1 (H) 11.5 - 15.5 %    PLATELETS 713 (H) 150 - 400 x10^3/uL    MPV 11.0 8.7 - 12.5 fL   MAGNESIUM   Result Value Ref Range    MAGNESIUM 2.0 1.8 - 2.6 mg/dL    Narrative    Hemolysis can alter results at this level (slight).   BASIC METABOLIC PANEL   Result Value Ref Range    SODIUM 132 (L) 136 - 145 mmol/L    POTASSIUM 4.1 3.5 - 5.1 mmol/L    CHLORIDE 102 96 - 111 mmol/L    CO2 TOTAL 25 23 - 31 mmol/L    ANION GAP 5 4 - 13 mmol/L    CALCIUM 7.8 (L) 8.8 - 10.2 mg/dL    GLUCOSE 141 (H) 65 - 125 mg/dL    BUN 10 8 - 25 mg/dL    CREATININE 0.64 (L) 0.75 - 1.35 mg/dL    BUN/CREA RATIO 16 6 - 22    ESTIMATED GFR >90 >=60 mL/min/BSA    Narrative    Hemolysis can alter results at this level (slight).   POC BLOOD GLUCOSE (RESULTS)   Result Value Ref Range    GLUCOSE, POC 157 (H) 70 - 105 mg/dl   POC BLOOD GLUCOSE (RESULTS)   Result Value Ref Range    GLUCOSE, POC 170 (H) 70 - 105 mg/dl    POC BLOOD GLUCOSE (RESULTS)   Result Value Ref Range    GLUCOSE, POC 168 (H) 70 - 105 mg/dl   POC BLOOD GLUCOSE (RESULTS)   Result Value Ref Range    GLUCOSE, POC 218 (H) 70 - 105 mg/dl       I/O:  Date 04/16/21 0700 - 04/17/21 0659 04/17/21 0700 - 04/18/21 0659   Shift 0700-1459 1500-2259 2300-0659 24 Hour Total 0700-1459 1500-2259 2300-0659 24 Hour Total   INTAKE   I.V.(mL/kg/hr) 666(0.99) 579.15(0.86) 125(0.19) 1370.15(0.68)         IVPB Volume    120 120         Med (IV) Flush Volume   5 5         Volume Infused (electrolyte-A (PLASMALYTE-A) premix infusion) 450 273.75  723.75         Volume Infused (adult custom parenteral nutrition) 216 305.4  521.4       IV Piggyback 178   178         Volume (DAPTOmycin (CUBICIN) 800 mg in NS 50 mL IVPB) 66   66         Volume (sodium phosphate 30 mmol in D5W 100 mL IVPB) 112   112       Shift Total(mL/kg) 844(10) 579.15(6.86) 125(1.48) 1548.15(18.34)       OUTPUT   Urine(mL/kg/hr) 950(1.41) 975(1.44) 1050(1.56) 2975(1.47) 600   600     Urine  400  400         Urine (Voided) [phone] 2575 600   600   Drains 220.5 440 240 900.5 350   350     JP Drain Output ([place] Drain Right;Upper Abdomen) 0.5 10  10.5  Drain Output (Drain (Miscellaneous) #21F pigtail catheter for RLQ fluid collection Lower;Right;Anterior Abdomen) 170 380 190 740 300   300     Drain Output (Drain (Miscellaneous) peds ostomy bag Lower;Medial Abdomen) 50 50 50 150 50   50   Stool             Stool Occurrence  1 x  1 x       Shift Total(mL/kg) 1170.5(13.87) 2500(37.04) 8889(16.94) 3875.5(45.92) 950(11.26)   950(11.26)   Weight (kg) 84.4 84.4 84.4 84.4 84.4 84.4 84.4 84.4       Radiology  Results for orders placed or performed during the hospital encounter of 04/03/21 (from the past 72 hour(s))   IR ORDERABLE     Status: None    Narrative    PROCEDURE: Inferior vena cava (IVC) filter insertion    Procedural Personnel  Attending physician(s): Kerney Elbe, MD  Resident physician(s):  Loletha Grayer    Pre-procedure diagnosis: PE/DVT  Post-procedure diagnosis: Same  Indication: Venous thromboembolism and contraindication to anticoagulation  Additional clinical history: None    Complications: No immediate complications.      Impression    Successful insertion of inferior vena cava filter in the infrarenal IVC.    Plan:  - Bedrest with head of bed elevated for 2 hours  - The filter is potentially retrievable.  If the patient is placed on anticoagulation, the patient should be referred to IR for filter removal.     _______________________________________________________________    PROCEDURE SUMMARY:  - IVC filter insertion under fluoroscopic guidance  - Additional procedure(s): None    PROCEDURE DETAILS:    Pre-procedure  Consent: Informed consent for the procedure including risks, benefits and alternatives was obtained and time-out was performed prior to the procedure.  Preparation: The site was prepared and draped using maximal sterile barrier technique including cutaneous antisepsis.    Anesthesia/sedation  Level of anesthesia/sedation: Moderate sedation (conscious sedation)  Anesthesia/sedation administered by: Nurse or other independent trained observer    Access  Local anesthesia was administered. The vessel was sonographically evaluated and determined to be patent. Real time ultrasound was used to visualize needle entry into the vessel and a 5 [place] micropuncture set was placed.   Vein accessed: Right internal jugular vein  Access technique: Micropuncture set with 21 gauge needle    Venography  Venography of the inferior vena cava (IVC) was performed to evaluate IVC size, anatomy and patency.  Catheter tip position for venography: Inferior vena cava  Venography findings: Patent IVC with normal diameter.    Filter placement  The filter delivery sheath was advanced and the filter was deployed under fluoroscopic guidance.   Filter placed: Option ELITE IVC Filter ([employer])    Post-placement  imaging  Post-placement imaging technique: frontal spot film  Filter position: Infrarenal IVC  Additional findings: None    Closure  The sheath was removed and hemostasis was achieved with manual compression. A sterile dressing was applied.    Contrast  Contrast agent: Isovue 300  Contrast volume (mL): 30    Radiation Dose  Fluoroscopy time: 4.3 minutes  Reference air kerma: 105 mGy    Additional Details  Additional description of procedure: None  Equipment details: None  Specimens removed: None  Estimated blood loss (mL): Less than 10  Standardized report: [email or web address]    Attestation  Signer name: Kerney Elbe, MD  I attest that I was present for the entire procedure. I reviewed the stored images and agree with the report  as written.           Current Medications:  Current Facility-Administered Medications   Medication Dose Route Frequency   . acetaminophen (TYLENOL) tablet  650 mg Oral Q4H   . adult custom parenteral nutrition   Intravenous Continuous   . adult custom parenteral nutrition   Intravenous Continuous   . atorvastatin (LIPITOR) tablet  40 mg Oral Daily with Breakfast   . benzonatate (TESSALON) capsule  100 mg Oral Q8H PRN   . D5W 250 mL flush bag   Intravenous Q15 Min PRN   . DAPTOmycin (CUBICIN) 800 mg in NS 50 mL IVPB  10 mg/kg (Adjusted) Intravenous Q24H   . docusate sodium (COLACE) capsule  100 mg Oral 2x/day   . electrolyte-A (PLASMALYTE-A) premix infusion   Intravenous Continuous   . elvitegravir-cobicistat-emtricitrabine-tenofovir (GENVOYA) 150-150-200-10 mg per tablet  1 Tablet Oral Daily   . gabapentin (NEURONTIN) capsule  300 mg Oral 3x/day   . [Held by provider] heparin 25,000 units in NS 250 mL infusion  18 Units/kg/hr (Adjusted) Intravenous Continuous   . HYDROmorphone (DILAUDID) 1 mg/mL injection  0.2 mg Intravenous Q3H PRN   . meropenem (MERREM) 1 g in NS 100 mL IVPB  1 g Intravenous Q8H   . methocarbamol (ROBAXIN) tablet  500 mg Oral 4x/day   .  multivitamin-minerals-folic acid-lycopene-lutein (CERTAVITE SENIOR) tablet  1 Tablet Oral Q24H   . NS 250 mL flush bag   Intravenous Q15 Min PRN   . NS flush syringe  2-6 mL Intracatheter Q8HRS   . NS flush syringe  2-6 mL Intracatheter Q1 MIN PRN   . NS flush syringe  10-30 mL Intracatheter Q8HRS   . NS flush syringe  20-30 mL Intracatheter Q1 MIN PRN   . NS flush syringe  10-30 mL Intracatheter Q8HRS   . NS flush syringe  20-30 mL Intracatheter Q1 MIN PRN   . ondansetron (ZOFRAN) 2 mg/mL injection  4 mg Intravenous Q6H PRN   . oxyCODONE (ROXICODONE) immediate release tablet  5 mg Oral Q4H PRN   . oxyCODONE (ROXICODONE) immediate releaste tablet  10 mg Oral Q4H PRN   . pantoprazole (PROTONIX) injection  40 mg Intravenous Q12H    And   . [START ON 04/18/2021] pantoprazole (PROTONIX) delayed release tablet  40 mg Oral Q12H   . sennosides-docusate sodium (SENOKOT-S) 8.6-'50mg'$  per tablet  1 Tablet Oral 2x/day   . sodium phosphate 20 mmol in D5W 100 mL IVPB  20 mmol Intravenous Once   . SSIP insulin R human (HUMULIN R) 100 units/mL injection  0-12 Units Subcutaneous Q6H PRN       Stacey Drain, DO    PGY-1  General Surgery  Pager # [phone]

## 2021-04-17 NOTE — Care Plan (Signed)
Problem: Adult Inpatient Plan of Care  Goal: Plan of Care Review  Outcome: Ongoing (see interventions/notes)  Goal: Patient-Specific Goal (Individualized)  Outcome: Ongoing (see interventions/notes)  Goal: Absence of Hospital-Acquired Illness or Injury  Outcome: Ongoing (see interventions/notes)  Intervention: Identify and Manage Fall Risk  Recent Flowsheet Documentation  Taken 04/17/2021 0020 by Catarina Hartshorn, RN  Safety Promotion/Fall Prevention:   elopement precautions initiated   activity supervised   muscle strengthening facilitated   fall prevention program maintained   motion sensor pad activated   nonskid shoes/slippers when out of bed   safety round/check completed  Taken 04/16/2021 2200 by Catarina Hartshorn, RN  Safety Promotion/Fall Prevention:   elopement precautions initiated   activity supervised   fall prevention program maintained   muscle strengthening facilitated   motion sensor pad activated   nonskid shoes/slippers when out of bed   safety round/check completed  Taken 04/16/2021 2030 by Catarina Hartshorn, RN  Safety Promotion/Fall Prevention:   activity supervised   fall prevention program maintained   elopement precautions initiated   muscle strengthening facilitated   motion sensor pad activated   nonskid shoes/slippers when out of bed   safety round/check completed  Taken 04/16/2021 1949 by Catarina Hartshorn, RN  Safety Promotion/Fall Prevention:   activity supervised   elopement precautions initiated   fall prevention program maintained   muscle strengthening facilitated   motion sensor pad activated   nonskid shoes/slippers when out of bed   safety round/check completed  Intervention: Prevent Skin Injury  Recent Flowsheet Documentation  Taken 04/16/2021 1949 by Catarina Hartshorn, RN  Body Position:   positioned/repositioned independently   weight shift assistance provided   positioned with 2 assist  Skin Protection:   preventative decubiti skin protection foam dressing applied/intact   skin-to-device areas  padded   transparent dressing maintained   tubing/devices free from skin contact   adhesive use limited   incontinence pads utilized  Intervention: Prevent and Manage VTE (Venous Thromboembolism) Risk  Recent Flowsheet Documentation  Taken 04/16/2021 1949 by Catarina Hartshorn, RN  VTE Prevention/Management:   ambulation promoted   dorsiflexion/plantar flexion performed  Intervention: Prevent Infection  Recent Flowsheet Documentation  Taken 04/16/2021 1949 by Catarina Hartshorn, RN  Infection Prevention:   barrier precautions utilized   environmental surveillance performed   equipment surfaces disinfected   glycemic control managed   single patient room provided   promote handwashing   rest/sleep promoted  Goal: Optimal Comfort and Wellbeing  Outcome: Ongoing (see interventions/notes)  Intervention: Provide Person-Centered Care  Recent Flowsheet Documentation  Taken 04/16/2021 1949 by Catarina Hartshorn, RN  Trust Relationship/Rapport:   emotional support provided   care explained   empathic listening provided   questions answered   questions encouraged   reassurance provided   thoughts/feelings acknowledged  Goal: Rounds/Family Conference  Outcome: Ongoing (see interventions/notes)     Problem: Mechanical Ventilation Invasive  Goal: Effective Communication  Outcome: Ongoing (see interventions/notes)  Intervention: Ensure Effective Communication  Recent Flowsheet Documentation  Taken 04/16/2021 1949 by Catarina Hartshorn, RN  Trust Relationship/Rapport:   emotional support provided   care explained   empathic listening provided   questions answered   questions encouraged   reassurance provided   thoughts/feelings acknowledged  Diversional Activities:   television   smartphone  Goal: Optimal Device Function  Outcome: Ongoing (see interventions/notes)  Intervention: Optimize Device Care and Function  Recent Flowsheet Documentation  Taken 04/16/2021 1949 by Catarina Hartshorn, RN  Airway Safety Measures: suction at bedside  Goal: Mechanical  Ventilation Liberation  Outcome: Ongoing (see interventions/notes)  Goal: Optimal Nutrition Delivery  Outcome: Ongoing (see interventions/notes)  Goal: Absence of Device-Related Skin and Tissue Injury  Outcome: Ongoing (see interventions/notes)  Intervention: Maintain Skin and Tissue Health  Recent Flowsheet Documentation  Taken 04/16/2021 1949 by Catarina Hartshorn, RN  Device Skin Pressure Protection:   absorbent pad utilized/changed   adhesive use limited   skin-to-device areas padded  Goal: Absence of Ventilator-Induced Lung Injury  Outcome: Ongoing (see interventions/notes)  Intervention: Prevent Ventilator-Associated Pneumonia  Recent Flowsheet Documentation  Taken 04/16/2021 1949 by Catarina Hartshorn, RN  Head of Bed [facility]) Positioning: HOB at 45 degrees     Problem: Fall Injury Risk  Goal: Absence of Fall and Fall-Related Injury  Outcome: Ongoing (see interventions/notes)  Intervention: Promote Injury-Free Environment  Recent Flowsheet Documentation  Taken 04/17/2021 0020 by Catarina Hartshorn, RN  Safety Promotion/Fall Prevention:   elopement precautions initiated   activity supervised   muscle strengthening facilitated   fall prevention program maintained   motion sensor pad activated   nonskid shoes/slippers when out of bed   safety round/check completed  Taken 04/16/2021 2200 by Catarina Hartshorn, RN  Safety Promotion/Fall Prevention:   elopement precautions initiated   activity supervised   fall prevention program maintained   muscle strengthening facilitated   motion sensor pad activated   nonskid shoes/slippers when out of bed   safety round/check completed  Taken 04/16/2021 2030 by Catarina Hartshorn, RN  Safety Promotion/Fall Prevention:   activity supervised   fall prevention program maintained   elopement precautions initiated   muscle strengthening facilitated   motion sensor pad activated   nonskid shoes/slippers when out of bed   safety round/check completed  Taken 04/16/2021 1949 by Catarina Hartshorn, RN  Safety Promotion/Fall  Prevention:   activity supervised   elopement precautions initiated   fall prevention program maintained   muscle strengthening facilitated   motion sensor pad activated   nonskid shoes/slippers when out of bed   safety round/check completed     Problem: Skin Injury Risk Increased  Goal: Skin Health and Integrity  Outcome: Ongoing (see interventions/notes)  Intervention: Optimize Skin Protection  Recent Flowsheet Documentation  Taken 04/16/2021 1949 by Catarina Hartshorn, RN  Pressure Reduction Techniques: frequent weight shift encouraged  Pressure Reduction Devices: (L.M,H,VH) Specialty Bed utilized  Skin Protection:   preventative decubiti skin protection foam dressing applied/intact   skin-to-device areas padded   transparent dressing maintained   tubing/devices free from skin contact   adhesive use limited   incontinence pads utilized  Activity Management:   dorsiflexion, plantar flexion encouraged   ROM, active encouraged   activity adjusted per tolerance   activity clustered for rest periods   activity encouraged  Head of Bed (HOB) Positioning: HOB at 45 degrees     Problem: Parenteral Nutrition  Goal: Effective Intravenous Nutrition Therapy Delivery  Outcome: Ongoing (see interventions/notes)

## 2021-04-18 LAB — BASIC METABOLIC PANEL
ANION GAP: 4 mmol/L (ref 4–13)
BUN/CREA RATIO: 18 (ref 6–22)
BUN: 11 mg/dL (ref 8–25)
CALCIUM: 7.8 mg/dL — ABNORMAL LOW (ref 8.8–10.2)
CHLORIDE: 101 mmol/L (ref 96–111)
CO2 TOTAL: 27 mmol/L (ref 23–31)
CREATININE: 0.62 mg/dL — ABNORMAL LOW (ref 0.75–1.35)
ESTIMATED GFR: >90 mL/min/BSA (ref >=60)
GLUCOSE: 170 mg/dL — ABNORMAL HIGH (ref 65–125)
POTASSIUM: 4.1 mmol/L (ref 3.5–5.1)
SODIUM: 132 mmol/L — ABNORMAL LOW (ref 136–145)

## 2021-04-18 LAB — CBC
HCT: 23.1 % — ABNORMAL LOW (ref 38.9–52.0)
HGB: 7.2 g/dL — ABNORMAL LOW (ref 13.4–17.5)
MCH: 29.3 pg (ref 26.0–32.0)
MCHC: 31.2 g/dL (ref 31.0–35.5)
MCV: 93.9 fL (ref 78.0–100.0)
MPV: 10.8 fL (ref 8.7–12.5)
PLATELETS: 694 10*3/uL — ABNORMAL HIGH (ref 150–400)
RBC: 2.46 10*6/uL — ABNORMAL LOW (ref 4.50–6.10)
RDW-CV: 17 % — ABNORMAL HIGH (ref 11.5–15.5)
WBC: 16.5 10*3/uL — ABNORMAL HIGH (ref 3.7–11.0)

## 2021-04-18 LAB — POC BLOOD GLUCOSE (RESULTS)
GLUCOSE, POC: 170 mg/dl — ABNORMAL HIGH (ref 70–105)
GLUCOSE, POC: 162 mg/dl — ABNORMAL HIGH (ref 70–105)
GLUCOSE, POC: 212 mg/dl — ABNORMAL HIGH (ref 70–105)
GLUCOSE, POC: 169 mg/dl — ABNORMAL HIGH (ref 70–105)
GLUCOSE, POC: 196 mg/dl — ABNORMAL HIGH (ref 70–105)

## 2021-04-18 LAB — PHOSPHORUS: PHOSPHORUS: 2.1 mg/dL — ABNORMAL LOW (ref 2.3–4.0)

## 2021-04-18 LAB — MAGNESIUM: MAGNESIUM: 2 mg/dL (ref 1.8–2.6)

## 2021-04-18 MED ORDER — WATER FOR INJECTION, STERILE INTRAVENOUS SOLUTION
INTRAVENOUS | Status: AC
Start: 2021-04-18 — End: 2021-04-19
  Filled 2021-04-18: qty 850

## 2021-04-18 MED ORDER — SODIUM PHOSPHATE 3 MMOL/ML INTRAVENOUS SOLUTION
20.0000 mmol | Freq: Once | INTRAVENOUS | Status: AC
Start: 2021-04-18 — End: 2021-04-18
  Administered 2021-04-18: 20 mmol via INTRAVENOUS
  Administered 2021-04-18: 0 mmol via INTRAVENOUS
  Filled 2021-04-18: qty 6.67

## 2021-04-18 NOTE — Progress Notes (Signed)
[facility]  Surgical Oncology  Progress Note      Nazareth, Kirk, 76 y.o. male  Date of Birth:  09-13-1945  Date of Admission:  04/03/2021  Date of service: 04/18/2021    Assessment:  This is a 76 y.o. 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 He underwent massive transfusion protocol and ex lap on 04/03/21 at an outside facility with ligation of a bleeding vessel in the liver bed. Patient was subsequently transferred to [facility] postoperaively, intubated. Patient was subsequently extubated later that day on 3/4. 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      Plan/Recommendations:  - Hematochezia   - Hemoglobin stable   - Continue holding heparin   - Diet: Advance to diabetic diet  - Patient underwent drain placement & emobolization with IR on 3/10  - Peripancreatic fluid collections              - IR consulted; drain placement 3/10              - Blood cultures 3/9: + pseudomonas    - Cultures form R JP: Pseudomonas and enterococcus              - Repeat Blood cultures 3/12 negative   - ID consulted; continue Daptomycin and meropenum (stop date 4/7)  - PE   - IVC filter placement 3/17   - Encouraged IS and ambulation   - Aggressive pulm toilet   - Wean O2   - Midline fistula with ostomy bag added to monitor output  - OOB TID  - PT/OT ordered    Subjective:   No bloody BMs overnight. Overall feels well this morning. Endorses some pain in right abdomen near IR drain. Denies N/V, tolerating clears without issues.      Objective  Filed Vitals:    04/18/21 0330 04/18/21 0640 04/18/21 0715 04/18/21 0900   BP: 137/74      Pulse: 95   (!) 105   Resp: '20 18  20   '$ Temp: 36.9 C (98.5 F)  37.2 C (99 F) 36.9 C (98.4 F)   SpO2: 91%   92%     Physical Exam:   GEN:  AOx4, resting in bed, no acute distress  PULM: Normal  respiratory effort.   CV:  Tachycardic, regular rhythm   ABD:   Abdomen soft, minimal ttp RUQ, nondistended. JP in place in RUQ with minimal dark gray output,  Lower midline incision with feculent drainage present with ostomy bag over it.  Pigtail catheter present with SS output.  MS: Atraumatic. Moves all extremities.  NEURO:   Alert and oriented to person, place and time.  Cranial nerves grossly intact.    Integumentary:  Pink, warm, and dry  PSYCHOSOCIAL: Pleasant.  Normal affect.     Labs  Results for orders placed or performed during the hospital encounter of 04/03/21 (from the past 24 hour(s))   PHOSPHORUS   Result Value Ref Range    PHOSPHORUS 2.1 (L) 2.3 - 4.0 mg/dL   CBC   Result Value Ref Range    WBC 16.5 (H) 3.7 - 11.0 x10^3/uL    RBC 2.46 (L) 4.50 - 6.10 x10^6/uL    HGB 7.2 (L) 13.4 - 17.5 g/dL    HCT 23.1 (L) 38.9 - 52.0 %  MCV 93.9 78.0 - 100.0 fL    MCH 29.3 26.0 - 32.0 pg    MCHC 31.2 31.0 - 35.5 g/dL    RDW-CV 17.0 (H) 11.5 - 15.5 %    PLATELETS 694 (H) 150 - 400 x10^3/uL    MPV 10.8 8.7 - 12.5 fL   MAGNESIUM   Result Value Ref Range    MAGNESIUM 2.0 1.8 - 2.6 mg/dL   BASIC METABOLIC PANEL   Result Value Ref Range    SODIUM 132 (L) 136 - 145 mmol/L    POTASSIUM 4.1 3.5 - 5.1 mmol/L    CHLORIDE 101 96 - 111 mmol/L    CO2 TOTAL 27 23 - 31 mmol/L    ANION GAP 4 4 - 13 mmol/L    CALCIUM 7.8 (L) 8.8 - 10.2 mg/dL    GLUCOSE 170 (H) 65 - 125 mg/dL    BUN 11 8 - 25 mg/dL    CREATININE 0.62 (L) 0.75 - 1.35 mg/dL    BUN/CREA RATIO 18 6 - 22    ESTIMATED GFR >90 >=60 mL/min/BSA   POC BLOOD GLUCOSE (RESULTS)   Result Value Ref Range    GLUCOSE, POC 218 (H) 70 - 105 mg/dl   POC BLOOD GLUCOSE (RESULTS)   Result Value Ref Range    GLUCOSE, POC 155 (H) 70 - 105 mg/dl   POC BLOOD GLUCOSE (RESULTS)   Result Value Ref Range    GLUCOSE, POC 170 (H) 70 - 105 mg/dl   POC BLOOD GLUCOSE (RESULTS)   Result Value Ref Range    GLUCOSE, POC 162 (H) 70 - 105 mg/dl       I/O:  Date 04/17/21 0700 - 04/18/21 0659 04/18/21 0700  - 04/19/21 0659   Shift 0700-1459 1500-2259 2300-0659 24 Hour Total 0700-1459 1500-2259 2300-0659 24 Hour Total   INTAKE   P.O.   180 180         Oral   180 180       I.V.(mL/kg/hr)  0174.94(4.96) 350(0.52) 1590.83(0.79)         Volume Infused (adult custom parenteral nutrition)  1200  1200         Volume Infused (adult custom parenteral nutrition)  40.83 350 390.83       IV Piggyback 66 108  174         Volume (DAPTOmycin (CUBICIN) 800 mg in NS 50 mL IVPB) 66   66         Volume (sodium phosphate 20 mmol in D5W 100 mL IVPB)  108  108       Shift Total(mL/kg) 66(0.78) 7591.63(84.66) 530(6.28) 5993.57(01.77)       OUTPUT   Urine(mL/kg/hr) 600(0.89) 9390(3.00) 1450(2.15) 3550(1.75)         Urine (Voided) 600 1500 1450 3550         Urine Occurrence   0 x 0 x       Emesis             Emesis Occurrence   0 x 0 x       Drains 520 [phone] 175   175     JP Drain Output ([place] Drain Right;Upper Abdomen)   0 0 0   0     Drain Output (Drain (Miscellaneous) #62F pigtail catheter for RLQ fluid collection Lower;Right;Anterior Abdomen) [phone] 150   150     Drain Output (Drain (Miscellaneous) peds ostomy bag Lower;Medial Abdomen) 50 30 25 105 25  25   Stool             Stool Occurrence   0 x 0 x       Shift Total(mL/kg) 3295(18.84) 1660(63.01) 1900(22.51) 6010(93.23) 175(2.07)   175(2.07)   Weight (kg) 84.4 84.4 84.4 84.4 84.4 84.4 84.4 84.4       Radiology  Results for orders placed or performed during the hospital encounter of 04/03/21 (from the past 72 hour(s))   IR ORDERABLE     Status: None    Narrative    PROCEDURE: Inferior vena cava (IVC) filter insertion    Procedural Personnel  Attending physician(s): Kerney Elbe, MD  Resident physician(s): Loletha Grayer    Pre-procedure diagnosis: PE/DVT  Post-procedure diagnosis: Same  Indication: Venous thromboembolism and contraindication to anticoagulation  Additional clinical history: None    Complications: No immediate complications.      Impression     Successful insertion of inferior vena cava filter in the infrarenal IVC.    Plan:  - Bedrest with head of bed elevated for 2 hours  - The filter is potentially retrievable.  If the patient is placed on anticoagulation, the patient should be referred to IR for filter removal.     _______________________________________________________________    PROCEDURE SUMMARY:  - IVC filter insertion under fluoroscopic guidance  - Additional procedure(s): None    PROCEDURE DETAILS:    Pre-procedure  Consent: Informed consent for the procedure including risks, benefits and alternatives was obtained and time-out was performed prior to the procedure.  Preparation: The site was prepared and draped using maximal sterile barrier technique including cutaneous antisepsis.    Anesthesia/sedation  Level of anesthesia/sedation: Moderate sedation (conscious sedation)  Anesthesia/sedation administered by: Nurse or other independent trained observer    Access  Local anesthesia was administered. The vessel was sonographically evaluated and determined to be patent. Real time ultrasound was used to visualize needle entry into the vessel and a 5 [place] micropuncture set was placed.   Vein accessed: Right internal jugular vein  Access technique: Micropuncture set with 21 gauge needle    Venography  Venography of the inferior vena cava (IVC) was performed to evaluate IVC size, anatomy and patency.  Catheter tip position for venography: Inferior vena cava  Venography findings: Patent IVC with normal diameter.    Filter placement  The filter delivery sheath was advanced and the filter was deployed under fluoroscopic guidance.   Filter placed: Option ELITE IVC Filter ([employer])    Post-placement imaging  Post-placement imaging technique: frontal spot film  Filter position: Infrarenal IVC  Additional findings: None    Closure  The sheath was removed and hemostasis was achieved with manual compression. A sterile dressing was  applied.    Contrast  Contrast agent: Isovue 300  Contrast volume (mL): 30    Radiation Dose  Fluoroscopy time: 4.3 minutes  Reference air kerma: 105 mGy    Additional Details  Additional description of procedure: None  Equipment details: None  Specimens removed: None  Estimated blood loss (mL): Less than 10  Standardized report: [email or web address]    Attestation  Signer name: Kerney Elbe, MD  I attest that I was present for the entire procedure. I reviewed the stored images and agree with the report as written.           Current Medications:  Current Facility-Administered Medications   Medication Dose Route Frequency   . acetaminophen (TYLENOL) tablet  650 mg Oral Q4H   . adult custom parenteral  nutrition   Intravenous Continuous   . atorvastatin (LIPITOR) tablet  40 mg Oral Daily with Breakfast   . benzonatate (TESSALON) capsule  100 mg Oral Q8H PRN   . D5W 250 mL flush bag   Intravenous Q15 Min PRN   . DAPTOmycin (CUBICIN) 800 mg in NS 50 mL IVPB  10 mg/kg (Adjusted) Intravenous Q24H   . docusate sodium (COLACE) capsule  100 mg Oral 2x/day   . electrolyte-A (PLASMALYTE-A) premix infusion   Intravenous Continuous   . elvitegravir-cobicistat-emtricitrabine-tenofovir (GENVOYA) 150-150-200-10 mg per tablet  1 Tablet Oral Daily   . gabapentin (NEURONTIN) capsule  300 mg Oral 3x/day   . [Held by provider] heparin 25,000 units in NS 250 mL infusion  18 Units/kg/hr (Adjusted) Intravenous Continuous   . HYDROmorphone (DILAUDID) 1 mg/mL injection  0.2 mg Intravenous Q3H PRN   . meropenem (MERREM) 1 g in NS 100 mL IVPB  1 g Intravenous Q8H   . methocarbamol (ROBAXIN) tablet  500 mg Oral 4x/day   . multivitamin-minerals-folic acid-lycopene-lutein (CERTAVITE SENIOR) tablet  1 Tablet Oral Q24H   . NS 250 mL flush bag   Intravenous Q15 Min PRN   . NS flush syringe  2-6 mL Intracatheter Q8HRS   . NS flush syringe  2-6 mL Intracatheter Q1 MIN PRN   . NS flush syringe  10-30 mL Intracatheter Q8HRS   . NS flush syringe   20-30 mL Intracatheter Q1 MIN PRN   . NS flush syringe  10-30 mL Intracatheter Q8HRS   . NS flush syringe  20-30 mL Intracatheter Q1 MIN PRN   . ondansetron (ZOFRAN) 2 mg/mL injection  4 mg Intravenous Q6H PRN   . oxyCODONE (ROXICODONE) immediate release tablet  5 mg Oral Q4H PRN   . oxyCODONE (ROXICODONE) immediate releaste tablet  10 mg Oral Q4H PRN   . pantoprazole (PROTONIX) delayed release tablet  40 mg Oral Q12H   . sennosides-docusate sodium (SENOKOT-S) 8.6-'50mg'$  per tablet  1 Tablet Oral 2x/day   . sodium phosphate 20 mmol in D5W 100 mL IVPB  20 mmol Intravenous Once   . SSIP insulin R human (HUMULIN R) 100 units/mL injection  0-12 Units Subcutaneous Q6H PRN       Stacey Drain, DO    PGY-1  General Surgery  Pager # [phone]

## 2021-04-18 NOTE — Care Plan (Signed)
Problem: Adult Inpatient Plan of Care  Goal: Plan of Care Review  Outcome: Ongoing (see interventions/notes)  Goal: Patient-Specific Goal (Individualized)  Outcome: Ongoing (see interventions/notes)  Flowsheets (Taken 04/17/2021 2006)  Anxieties, Fears or Concerns: N/A  Goal: Absence of Hospital-Acquired Illness or Injury  Outcome: Ongoing (see interventions/notes)  Intervention: Identify and Manage Fall Risk  Recent Flowsheet Documentation  Taken 04/17/2021 2006 by Cyril Loosen, RN  Safety Promotion/Fall Prevention:   activity supervised   fall prevention program maintained   muscle strengthening facilitated   nonskid shoes/slippers when out of bed   safety round/check completed  Intervention: Prevent Skin Injury  Recent Flowsheet Documentation  Taken 04/17/2021 2006 by Cyril Loosen, RN  Body Position:   supine, head elevated   semi-fowlers (30-45 degrees)   weight shift assistance provided   positioned with 1 assist  Skin Protection:   antimicrobial wipes   incontinence pads utilized   pouching devices used   protective footwear used   preventative decubiti skin protection foam dressing applied/intact   skin sealant/moisture barrier applied   tubing/devices free from skin contact  Intervention: Prevent and Manage VTE (Venous Thromboembolism) Risk  Recent Flowsheet Documentation  Taken 04/17/2021 2000 by Cyril Loosen, RN  VTE Prevention/Management: ambulation promoted  Intervention: Prevent Infection  Recent Flowsheet Documentation  Taken 04/17/2021 2006 by Cyril Loosen, RN  Infection Prevention:   barrier precautions utilized   personal protective equipment utilized   promote handwashing   rest/sleep promoted   single patient room provided  Goal: Optimal Comfort and Wellbeing  Outcome: Ongoing (see interventions/notes)  Intervention: Provide Person-Centered Care  Recent Flowsheet Documentation  Taken 04/17/2021 2006 by Cyril Loosen, [place] Relationship/Rapport:   care explained   choices provided    emotional support provided   empathic listening provided   questions answered   reassurance provided  Goal: Rounds/Family Conference  Outcome: Ongoing (see interventions/notes)     Problem: Mechanical Ventilation Invasive  Goal: Effective Communication  Outcome: Ongoing (see interventions/notes)  Intervention: Ensure Effective Communication  Recent Flowsheet Documentation  Taken 04/17/2021 2006 by Cyril Loosen, [place] Relationship/Rapport:   care explained   choices provided   emotional support provided   empathic listening provided   questions answered   reassurance provided  Taken 04/17/2021 2000 by Cyril Loosen, RN  Communication Enhancement Strategies:   call light answered in person   device use encouraged   extra time allowed for response   one-step directions provided   repetition utilized  Goal: Optimal Device Function  Outcome: Ongoing (see interventions/notes)  Intervention: [place] and Function  Recent Flowsheet Documentation  Taken 04/17/2021 2006 by Cyril Loosen, RN  Airway Safety Measures: suction at bedside  Goal: Mechanical Ventilation Liberation  Outcome: Ongoing (see interventions/notes)  Intervention: Promote Extubation and Mechanical Ventilation Liberation  Recent Flowsheet Documentation  Taken 04/17/2021 2006 by Cyril Loosen, RN  Sleep/Rest Enhancement:   awakenings minimized   consistent schedule promoted   noise level reduced   regular sleep/rest pattern promoted   relaxation techniques promoted   room darkened  Environmental Support:   calm environment promoted   environmental consistency promoted   personal routine supported   rest periods encouraged  Goal: Optimal Nutrition Delivery  Outcome: Ongoing (see interventions/notes)  Goal: Absence of Device-Related Skin and Tissue Injury  Outcome: Ongoing (see interventions/notes)  Intervention: Maintain Skin and Tissue Health  Recent Flowsheet Documentation  Taken 04/17/2021 2006 by Cyril Loosen, RN  Device Skin Pressure  Protection:  absorbent pad utilized/changed   positioning supports utilized   pressure points protected   skin-to-device areas padded  Goal: Absence of Ventilator-Induced Lung Injury  Outcome: Ongoing (see interventions/notes)  Intervention: Prevent Ventilator-Associated Pneumonia  Recent Flowsheet Documentation  Taken 04/17/2021 2006 by Cyril Loosen, RN  Head of Bed [facility]) Positioning: [facility] elevated     Problem: Fall Injury Risk  Goal: Absence of Fall and Fall-Related Injury  Outcome: Ongoing (see interventions/notes)  Intervention: Identify and Manage Contributors  Recent Flowsheet Documentation  Taken 04/17/2021 2006 by Cyril Loosen, RN  Self-Care Promotion: independence encouraged  Intervention: Promote Injury-Free Environment  Recent Flowsheet Documentation  Taken 04/17/2021 2006 by Cyril Loosen, RN  Safety Promotion/Fall Prevention:   activity supervised   fall prevention program maintained   muscle strengthening facilitated   nonskid shoes/slippers when out of bed   safety round/check completed     Problem: Skin Injury Risk Increased  Goal: Skin Health and Integrity  Outcome: Ongoing (see interventions/notes)  Intervention: Optimize Skin Protection  Recent Flowsheet Documentation  Taken 04/17/2021 2006 by Cyril Loosen, RN  Pressure Reduction Techniques:   frequent weight shift encouraged   heels elevated off bed   pressure points protected   weight shift assistance provided  Pressure Reduction Devices:   elbow protectors utilized   foam padding utilized (specify)   heels elevated off bed  Skin Protection:   antimicrobial wipes   incontinence pads utilized   pouching devices used   protective footwear used   preventative decubiti skin protection foam dressing applied/intact   skin sealant/moisture barrier applied   tubing/devices free from skin contact  Activity Management:   activity adjusted per tolerance   activity encouraged   ROM, active encouraged   activity clustered for rest periods  Head of Bed  (HOB) Positioning: HOB elevated     Problem: Parenteral Nutrition  Goal: Effective Intravenous Nutrition Therapy Delivery  Outcome: Ongoing (see interventions/notes)

## 2021-04-19 LAB — IONIZED CALCIUM
IONIZED CALCIUM: 1.17 mmol/L (ref 1.10–1.35)
PH (VENOUS): 7.48 — ABNORMAL HIGH (ref 7.31–7.41)

## 2021-04-19 LAB — CBC
HCT: 23.7 % — ABNORMAL LOW (ref 38.9–52.0)
HGB: 7.5 g/dL — ABNORMAL LOW (ref 13.4–17.5)
MCH: 29.6 pg (ref 26.0–32.0)
MCHC: 31.6 g/dL (ref 31.0–35.5)
MCV: 93.7 fL (ref 78.0–100.0)
MPV: 10.6 fL (ref 8.7–12.5)
PLATELETS: 627 10*3/uL — ABNORMAL HIGH (ref 150–400)
RBC: 2.53 10*6/uL — ABNORMAL LOW (ref 4.50–6.10)
RDW-CV: 17.2 % — ABNORMAL HIGH (ref 11.5–15.5)
WBC: 14.8 10*3/uL — ABNORMAL HIGH (ref 3.7–11.0)

## 2021-04-19 LAB — BASIC METABOLIC PANEL
ANION GAP: 5 mmol/L (ref 4–13)
BUN/CREA RATIO: 19 (ref 6–22)
BUN: 12 mg/dL (ref 8–25)
CALCIUM: 7.7 mg/dL — ABNORMAL LOW (ref 8.8–10.2)
CHLORIDE: 103 mmol/L (ref 96–111)
CO2 TOTAL: 24 mmol/L (ref 23–31)
CREATININE: 0.62 mg/dL — ABNORMAL LOW (ref 0.75–1.35)
ESTIMATED GFR: >90 mL/min/BSA (ref >=60)
GLUCOSE: 167 mg/dL — ABNORMAL HIGH (ref 65–125)
POTASSIUM: 4.2 mmol/L (ref 3.5–5.1)
SODIUM: 132 mmol/L — ABNORMAL LOW (ref 136–145)

## 2021-04-19 LAB — MAGNESIUM: MAGNESIUM: 2 mg/dL (ref 1.8–2.6)

## 2021-04-19 LAB — HEPATIC FUNCTION PANEL
ALBUMIN: 1.4 g/dL — ABNORMAL LOW (ref 3.4–4.8)
ALKALINE PHOSPHATASE: 141 U/L — ABNORMAL HIGH (ref 45–115)
ALT (SGPT): 62 U/L — ABNORMAL HIGH (ref 10–55)
AST (SGOT): 65 U/L — ABNORMAL HIGH (ref 8–45)
BILIRUBIN DIRECT: 0.1 mg/dL (ref 0.1–0.4)
BILIRUBIN TOTAL: 0.2 mg/dL — ABNORMAL LOW (ref 0.3–1.3)
PROTEIN TOTAL: 5.3 g/dL — ABNORMAL LOW (ref 6.0–8.0)

## 2021-04-19 LAB — PHOSPHORUS: PHOSPHORUS: 1.7 mg/dL — ABNORMAL LOW (ref 2.3–4.0)

## 2021-04-19 LAB — POC BLOOD GLUCOSE (RESULTS)
GLUCOSE, POC: 195 mg/dl — ABNORMAL HIGH (ref 70–105)
GLUCOSE, POC: 227 mg/dl — ABNORMAL HIGH (ref 70–105)

## 2021-04-19 LAB — CREATINE KINASE (CK), TOTAL, SERUM: CREATINE KINASE: 15 U/L — ABNORMAL LOW (ref 45–225)

## 2021-04-19 LAB — PTT (PARTIAL THROMBOPLASTIN TIME): APTT: 41 seconds — ABNORMAL HIGH (ref 24.2–37.5)

## 2021-04-19 MED ORDER — SODIUM PHOSPHATE 3 MMOL/ML INTRAVENOUS SOLUTION
30.0000 mmol | Freq: Once | INTRAVENOUS | Status: AC
Start: 2021-04-19 — End: 2021-04-19
  Administered 2021-04-19: 30 mmol via INTRAVENOUS
  Administered 2021-04-19: 0 mmol via INTRAVENOUS
  Filled 2021-04-19: qty 10

## 2021-04-19 MED ORDER — HEPARIN 25,000 UNIT/250 ML (100 UNIT/ML) IN NS IV SOLN
12.0000 [IU]/kg/h | INTRAVENOUS | Status: AC
Start: 2021-04-19 — End: 2021-04-22
  Administered 2021-04-19 (×2): 12 [IU]/kg/h via INTRAVENOUS
  Administered 2021-04-19: 20:00:00 14 [IU]/kg/h via INTRAVENOUS
  Administered 2021-04-20: 11:00:00 18 [IU]/kg/h via INTRAVENOUS
  Administered 2021-04-20: 19:00:00 22 [IU]/kg/h via INTRAVENOUS
  Administered 2021-04-20 (×2): 16 [IU]/kg/h via INTRAVENOUS
  Administered 2021-04-21: 12 [IU]/kg/h via INTRAVENOUS
  Administered 2021-04-21: 21 [IU]/kg/h via INTRAVENOUS
  Administered 2021-04-21: 0 [IU]/kg/h via INTRAVENOUS
  Administered 2021-04-21: 16 [IU]/kg/h via INTRAVENOUS
  Administered 2021-04-21: 12 [IU]/kg/h via INTRAVENOUS
  Administered 2021-04-22: 0 [IU]/kg/h via INTRAVENOUS
  Filled 2021-04-19 (×4): qty 250

## 2021-04-19 MED ORDER — DEXTROSE 70 % IN WATER (D70W) INTRAVENOUS SOLUTION
INTRAVENOUS | Status: AC
Start: 2021-04-19 — End: 2021-04-20
  Filled 2021-04-19: qty 850

## 2021-04-19 MED ORDER — SODIUM PHOSPHATE 3 MMOL/ML INTRAVENOUS SOLUTION
30.0000 mmol | INTRAVENOUS | Status: AC
Start: 2021-04-19 — End: 2021-04-19
  Administered 2021-04-19: 0 mmol via INTRAVENOUS
  Administered 2021-04-19: 30 mmol via INTRAVENOUS
  Filled 2021-04-19: qty 10

## 2021-04-19 NOTE — Progress Notes (Signed)
[facility]  Surgical Oncology  Progress Note      [name], [name], 76 y.o. male  Date of Birth:  1945-12-24  Date of Admission:  04/03/2021  Date of service: 04/19/2021    Assessment:  This is a 76 y.o. 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 He underwent massive transfusion protocol and ex lap on 04/03/21 at an outside facility with ligation of a bleeding vessel in the liver bed. Patient was subsequently transferred to [name] postoperaively, intubated. Patient was subsequently extubated later that day on 3/4. 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      Plan/Recommendations:  - Hematochezia resolved  - Diet: Diabetic diet, TPN  - Peripancreatic fluid collections              - IR consulted; RUQ drain placement and empiric embolization 3/10              - Blood cultures 3/9: + pseudomonas    - Cultures form R JP: Pseudomonas and enterococcus              - Repeat Blood cultures 3/12 negative   - ID consulted; continue Daptomycin and meropenum (stop date 4/7)  - PE   - IVC filter placement 3/17   - Encouraged IS and ambulation   - Aggressive pulm toilet   - Wean O2    - Restart heparin gtt at low intensity  - Labs: phosphorus replaced  - IVF: discontinued  - 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 Reports feeling well this morning and is tolerating a diabetic diet without N/V. Has some pain at the surface where the JP enters the skin but otherwise voices no complaints.     Objective  Filed Vitals:    04/18/21 2315 04/19/21 0028 04/19/21 0230 04/19/21 0512   BP: 98/85  122/70    Pulse: 94  96    Resp: '12 13 12 12   '$ Temp: 36.9 C (98.4 F)  36.8 C (98.2 F)    SpO2: 92%  91%      Physical Exam:   GEN:  AOx4, resting in bed, no acute  distress  PULM: Normal respiratory effort.   CV:  Tachycardic, regular rhythm   ABD:   Abdomen soft, minimal ttp RUQ, nondistended. JP in place in RUQ with minimal purulent output,  Lower midline incision with feculent drainage present with ostomy bag over it.  Pigtail catheter present with SS output.  MS: Atraumatic. Moves all extremities.  NEURO:   Alert and oriented to person, place and time.  Cranial nerves grossly intact.    Integumentary:  Pink, warm, and dry  PSYCHOSOCIAL: Pleasant.  Normal affect.     Labs  Results for orders placed or performed during the hospital encounter of 04/03/21 (from the past 24 hour(s))   CREATINE KINASE (CK), TOTAL, SERUM   Result Value Ref Range    CREATINE KINASE 15 (L) 45 - 225 U/L   PHOSPHORUS   Result Value Ref Range    PHOSPHORUS 1.7 (L) 2.3 - 4.0 mg/dL   CBC   Result Value Ref Range    WBC 14.8 (H) 3.7 - 11.0 x10^3/uL    RBC 2.53 (  L) 4.50 - 6.10 x10^6/uL    HGB 7.5 (L) 13.4 - 17.5 g/dL    HCT 23.7 (L) 38.9 - 52.0 %    MCV 93.7 78.0 - 100.0 fL    MCH 29.6 26.0 - 32.0 pg    MCHC 31.6 31.0 - 35.5 g/dL    RDW-CV 17.2 (H) 11.5 - 15.5 %    PLATELETS 627 (H) 150 - 400 x10^3/uL    MPV 10.6 8.7 - 12.5 fL   MAGNESIUM   Result Value Ref Range    MAGNESIUM 2.0 1.8 - 2.6 mg/dL   BASIC METABOLIC PANEL   Result Value Ref Range    SODIUM 132 (L) 136 - 145 mmol/L    POTASSIUM 4.2 3.5 - 5.1 mmol/L    CHLORIDE 103 96 - 111 mmol/L    CO2 TOTAL 24 23 - 31 mmol/L    ANION GAP 5 4 - 13 mmol/L    CALCIUM 7.7 (L) 8.8 - 10.2 mg/dL    GLUCOSE 167 (H) 65 - 125 mg/dL    BUN 12 8 - 25 mg/dL    CREATININE 0.62 (L) 0.75 - 1.35 mg/dL    BUN/CREA RATIO 19 6 - 22    ESTIMATED GFR >90 >=60 mL/min/BSA   HEPATIC FUNCTION PANEL   Result Value Ref Range    ALBUMIN 1.4 (L) 3.4 - 4.8 g/dL     ALKALINE PHOSPHATASE 141 (H) 45 - 115 U/L    ALT (SGPT) 62 (H) 10 - 55 U/L    AST (SGOT)  65 (H) 8 - 45 U/L    BILIRUBIN TOTAL 0.2 (L) 0.3 - 1.3 mg/dL    BILIRUBIN DIRECT 0.1 0.1 - 0.4 mg/dL    PROTEIN TOTAL 5.3 (L) 6.0 - 8.0  g/dL   IONIZED CALCIUM   Result Value Ref Range    PH (VENOUS) 7.48 (H) 7.31 - 7.41    IONIZED CALCIUM 1.17 1.10 - 1.35 mmol/L   POC BLOOD GLUCOSE (RESULTS)   Result Value Ref Range    GLUCOSE, POC 212 (H) 70 - 105 mg/dl   POC BLOOD GLUCOSE (RESULTS)   Result Value Ref Range    GLUCOSE, POC 169 (H) 70 - 105 mg/dl   POC BLOOD GLUCOSE (RESULTS)   Result Value Ref Range    GLUCOSE, POC 196 (H) 70 - 105 mg/dl   POC BLOOD GLUCOSE (RESULTS)   Result Value Ref Range    GLUCOSE, POC 195 (H) 70 - 105 mg/dl       I/O:  Date 04/18/21 0700 - 04/19/21 0659 04/19/21 0700 - 04/20/21 0659   Shift 0700-1459 1500-2259 2300-0659 24 Hour Total 0700-1459 1500-2259 2300-0659 24 Hour Total   INTAKE   P.O. 948 230  1178         Oral 948 230  1178       I.V.(mL/kg/hr)  315.17(6.1) 245(0.36) 1054.17(0.52)         IVPB Volume    240 240         Med (IV) Flush Volume   5 5         Volume Infused (adult custom parenteral nutrition)  809.17  809.17       IV Piggyback 108 66  174         Volume (DAPTOmycin (CUBICIN) 800 mg in NS 50 mL IVPB)  66  66         Volume (sodium phosphate 20 mmol in D5W 100 mL IVPB) 108   108  Shift Total(mL/kg) 8366(29.47) 6546.50(35.46) 245(2.9) 5681.27(51.70)       OUTPUT   Urine(mL/kg/hr) 350(0.52) 950(1.41) 1375(2.04) 0174(9.44)         Urine (Voided) [phone] 2675       Drains 485 [phone] 260   260     JP Drain Output ([place] Drain Right;Upper Abdomen) 0 0  0 0   0     Drain Output (Drain (Miscellaneous) #26F pigtail catheter for RLQ fluid collection Lower;Right;Anterior Abdomen) 460 [phone] 210   210     Drain Output (Drain (Miscellaneous) peds ostomy bag Lower;Medial Abdomen) 25 0 50 75 50   50   Shift Total(mL/kg) 835(9.89) 9675(91.63) 8466(59.93) 5701(77.93) 260(3.08)   260(3.08)   Weight (kg) 84.4 84.4 84.4 84.4 84.4 84.4 84.4 84.4       Radiology  Results for orders placed or performed during the hospital encounter of 04/03/21 (from the past 72 hour(s))   IR ORDERABLE     Status:  None    Narrative    PROCEDURE: Inferior vena cava (IVC) filter insertion    Procedural Personnel  Attending physician(s): Kerney Elbe, MD  Resident physician(s): Loletha Grayer    Pre-procedure diagnosis: PE/DVT  Post-procedure diagnosis: Same  Indication: Venous thromboembolism and contraindication to anticoagulation  Additional clinical history: None    Complications: No immediate complications.      Impression    Successful insertion of inferior vena cava filter in the infrarenal IVC.    Plan:  - Bedrest with head of bed elevated for 2 hours  - The filter is potentially retrievable.  If the patient is placed on anticoagulation, the patient should be referred to IR for filter removal.     _______________________________________________________________    PROCEDURE SUMMARY:  - IVC filter insertion under fluoroscopic guidance  - Additional procedure(s): None    PROCEDURE DETAILS:    Pre-procedure  Consent: Informed consent for the procedure including risks, benefits and alternatives was obtained and time-out was performed prior to the procedure.  Preparation: The site was prepared and draped using maximal sterile barrier technique including cutaneous antisepsis.    Anesthesia/sedation  Level of anesthesia/sedation: Moderate sedation (conscious sedation)  Anesthesia/sedation administered by: Nurse or other independent trained observer    Access  Local anesthesia was administered. The vessel was sonographically evaluated and determined to be patent. Real time ultrasound was used to visualize needle entry into the vessel and a 5 [place] micropuncture set was placed.   Vein accessed: Right internal jugular vein  Access technique: Micropuncture set with 21 gauge needle    Venography  Venography of the inferior vena cava (IVC) was performed to evaluate IVC size, anatomy and patency.  Catheter tip position for venography: Inferior vena cava  Venography findings: Patent IVC with normal diameter.    Filter placement  The filter  delivery sheath was advanced and the filter was deployed under fluoroscopic guidance.   Filter placed: Option ELITE IVC Filter ([employer])    Post-placement imaging  Post-placement imaging technique: frontal spot film  Filter position: Infrarenal IVC  Additional findings: None    Closure  The sheath was removed and hemostasis was achieved with manual compression. A sterile dressing was applied.    Contrast  Contrast agent: Isovue 300  Contrast volume (mL): 30    Radiation Dose  Fluoroscopy time: 4.3 minutes  Reference air kerma: 105 mGy    Additional Details  Additional description of procedure: None  Equipment details: None  Specimens removed: None  Estimated blood loss (mL): Less than 10  Standardized report: [email or web address]    Attestation  Signer name: Kerney Elbe, MD  I attest that I was present for the entire procedure. I reviewed the stored images and agree with the report as written.           Current Medications:  Current Facility-Administered Medications   Medication Dose Route Frequency    acetaminophen (TYLENOL) tablet  650 mg Oral Q4H    adult custom parenteral nutrition   Intravenous Continuous    atorvastatin (LIPITOR) tablet  40 mg Oral Daily with Breakfast    benzonatate (TESSALON) capsule  100 mg Oral Q8H PRN    D5W 250 mL flush bag   Intravenous Q15 Min PRN    DAPTOmycin (CUBICIN) 800 mg in NS 50 mL IVPB  10 mg/kg (Adjusted) Intravenous Q24H    docusate sodium (COLACE) capsule  100 mg Oral 2x/day    electrolyte-A (PLASMALYTE-A) premix infusion   Intravenous Continuous    elvitegravir-cobicistat-emtricitrabine-tenofovir (GENVOYA) 150-150-200-10 mg per tablet  1 Tablet Oral Daily    gabapentin (NEURONTIN) capsule  300 mg Oral 3x/day    heparin 25,000 units in NS 250 mL infusion  12 Units/kg/hr (Adjusted) Intravenous Continuous    HYDROmorphone (DILAUDID) 1 mg/mL injection  0.2 mg Intravenous Q3H PRN    meropenem (MERREM) 1 g in NS 100 mL IVPB  1 g Intravenous Q8H     methocarbamol (ROBAXIN) tablet  500 mg Oral 4x/day    multivitamin-minerals-folic acid-lycopene-lutein (CERTAVITE SENIOR) tablet  1 Tablet Oral Q24H    NS 250 mL flush bag   Intravenous Q15 Min PRN    NS flush syringe  2-6 mL Intracatheter Q8HRS    NS flush syringe  2-6 mL Intracatheter Q1 MIN PRN    NS flush syringe  10-30 mL Intracatheter Q8HRS    NS flush syringe  20-30 mL Intracatheter Q1 MIN PRN    NS flush syringe  10-30 mL Intracatheter Q8HRS    NS flush syringe  20-30 mL Intracatheter Q1 MIN PRN    ondansetron (ZOFRAN) 2 mg/mL injection  4 mg Intravenous Q6H PRN    oxyCODONE (ROXICODONE) immediate release tablet  5 mg Oral Q4H PRN    oxyCODONE (ROXICODONE) immediate releaste tablet  10 mg Oral Q4H PRN    pantoprazole (PROTONIX) delayed release tablet  40 mg Oral Q12H    sennosides-docusate sodium (SENOKOT-S) 8.6-'50mg'$  per tablet  1 Tablet Oral 2x/day    SSIP insulin R human (HUMULIN R) 100 units/mL injection  0-12 Units Subcutaneous Q6H PRN       Stacey Drain, DO    PGY-1  General Surgery  Pager # [phone]      I saw and examined the patient.  I reviewed the resident's note.  I agree with the findings and plan of care as documented in the resident's note.  Any exceptions/additions are edited/noted.    Aaron Edelman A. Cyndi Bender, MD, FACS

## 2021-04-19 NOTE — Care Plan (Signed)
04/19/21 1104   Therapist Pager   OT Assigned/ Pager # Eeshan [place] [phone]   Rehab Session   Document Type rehab contact note   Total OT Minutes: 0   Daily Activity AM-PAC/6-clicks Score   Patient Mobility Barrier Patient unstable or too ill to participate  (pt actively vomiting- willing to try this afternoon after additional meds)

## 2021-04-19 NOTE — Care Plan (Signed)
Fall / skin / isolation precautions. Geophysicist/field seismologist. Frequent repositioning for comfort and to prevent skin breakdown. TPN through R upper double-lumen PICC. MIVF. IV abx continued. Diabetic diet and tolerating well. R abd accordion drain with SS output. R abd JP with green/yellow very minimal output. 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 Complaints of pain during this shift - controlled with scheduled Tylenol, Neurontin, Robaxin and PRN oxycodone (see MAR).    Problem: Adult Inpatient Plan of Care  Goal: Plan of Care Review  04/19/2021 0743 by Catarina Hartshorn, RN  Outcome: Ongoing (see interventions/notes)  04/19/2021 0048 by Catarina Hartshorn, RN  Outcome: Ongoing (see interventions/notes)  Goal: Patient-Specific Goal (Individualized)  04/19/2021 0743 by Catarina Hartshorn, RN  Outcome: Ongoing (see interventions/notes)  04/19/2021 0048 by Catarina Hartshorn, RN  Outcome: Ongoing (see interventions/notes)  Goal: Absence of Hospital-Acquired Illness or Injury  04/19/2021 0743 by Catarina Hartshorn, RN  Outcome: Ongoing (see interventions/notes)  04/19/2021 0048 by Catarina Hartshorn, RN  Outcome: Ongoing (see interventions/notes)  Intervention: Identify and Manage Fall Risk  Recent Flowsheet Documentation  Taken 04/19/2021 0600 by Catarina Hartshorn, RN  Safety Promotion/Fall Prevention:   fall prevention program maintained   activity supervised   elopement precautions initiated   muscle strengthening facilitated   motion sensor pad activated   nonskid shoes/slippers when out of bed   safety round/check completed  Taken 04/19/2021 0430 by Catarina Hartshorn, RN  Safety Promotion/Fall Prevention:   elopement precautions initiated   activity supervised   fall prevention program maintained   muscle strengthening facilitated   motion sensor pad activated   nonskid shoes/slippers when out of bed   safety round/check completed  Taken 04/19/2021 0200 by Catarina Hartshorn, RN  Safety Promotion/Fall Prevention:   activity  supervised   elopement precautions initiated   fall prevention program maintained   muscle strengthening facilitated   motion sensor pad activated   nonskid shoes/slippers when out of bed   safety round/check completed  Taken 04/19/2021 0000 by Catarina Hartshorn, RN  Safety Promotion/Fall Prevention:   activity supervised   elopement precautions initiated   fall prevention program maintained   muscle strengthening facilitated   motion sensor pad activated   nonskid shoes/slippers when out of bed   safety round/check completed  Taken 04/18/2021 2200 by Catarina Hartshorn, RN  Safety Promotion/Fall Prevention:   activity supervised   elopement precautions initiated   fall prevention program maintained   muscle strengthening facilitated   motion sensor pad activated   nonskid shoes/slippers when out of bed   safety round/check completed  Taken 04/18/2021 2030 by Catarina Hartshorn, RN  Safety Promotion/Fall Prevention:   activity supervised   elopement precautions initiated   fall prevention program maintained   muscle strengthening facilitated   motion sensor pad activated   nonskid shoes/slippers when out of bed   safety round/check completed  Taken 04/18/2021 1930 by Catarina Hartshorn, RN  Safety Promotion/Fall Prevention:   elopement precautions initiated   activity supervised   fall prevention program maintained   muscle strengthening facilitated   motion sensor pad activated   nonskid shoes/slippers when out of bed   safety round/check completed  Intervention: Prevent Skin Injury  Recent Flowsheet Documentation  Taken 04/18/2021 1930 by Catarina Hartshorn, RN  Body Position: positioned/repositioned independently  Skin Protection:   preventative decubiti skin protection foam dressing applied/intact   skin-to-device areas padded   transparent dressing maintained   tubing/devices free from skin contact  adhesive use limited   incontinence pads utilized  Intervention: Prevent and Manage VTE (Venous Thromboembolism) Risk  Recent Flowsheet  Documentation  Taken 04/18/2021 1930 by Catarina Hartshorn, RN  VTE Prevention/Management:   dorsiflexion/plantar flexion performed   ambulation promoted   sequential compression devices on  Intervention: Prevent Infection  Recent Flowsheet Documentation  Taken 04/18/2021 1930 by Catarina Hartshorn, RN  Infection Prevention:   environmental surveillance performed   barrier precautions utilized   equipment surfaces disinfected   glycemic control managed   promote handwashing   rest/sleep promoted   single patient room provided  Goal: Optimal Comfort and Wellbeing  04/19/2021 0743 by Catarina Hartshorn, RN  Outcome: Ongoing (see interventions/notes)  04/19/2021 0048 by Catarina Hartshorn, RN  Outcome: Ongoing (see interventions/notes)  Intervention: Provide Person-Centered Care  Recent Flowsheet Documentation  Taken 04/18/2021 1930 by Catarina Hartshorn, RN  Trust Relationship/Rapport:   emotional support provided   care explained   questions answered   empathic listening provided   questions encouraged   reassurance provided   thoughts/feelings acknowledged  Goal: Rounds/Family Conference  04/19/2021 0743 by Catarina Hartshorn, RN  Outcome: Ongoing (see interventions/notes)  04/19/2021 0048 by Catarina Hartshorn, RN  Outcome: Ongoing (see interventions/notes)     Problem: Mechanical Ventilation Invasive  Goal: Effective Communication  04/19/2021 0743 by Catarina Hartshorn, RN  Outcome: Ongoing (see interventions/notes)  04/19/2021 0048 by Catarina Hartshorn, RN  Outcome: Ongoing (see interventions/notes)  Intervention: Ensure Effective Communication  Recent Flowsheet Documentation  Taken 04/18/2021 1930 by Catarina Hartshorn, RN  Trust Relationship/Rapport:   emotional support provided   care explained   questions answered   empathic listening provided   questions encouraged   reassurance provided   thoughts/feelings acknowledged  Diversional Activities:   television   smartphone  Goal: Optimal Device Function  04/19/2021 0743 by Catarina Hartshorn, RN  Outcome: Ongoing (see  interventions/notes)  04/19/2021 0048 by Catarina Hartshorn, RN  Outcome: Ongoing (see interventions/notes)  Intervention: Optimize Device Care and Function  Recent Flowsheet Documentation  Taken 04/18/2021 1930 by Catarina Hartshorn, RN  Airway Safety Measures: suction at bedside  Goal: Mechanical Ventilation Liberation  04/19/2021 0743 by Catarina Hartshorn, RN  Outcome: Ongoing (see interventions/notes)  04/19/2021 0048 by Catarina Hartshorn, RN  Outcome: Ongoing (see interventions/notes)  Goal: Optimal Nutrition Delivery  04/19/2021 0743 by Catarina Hartshorn, RN  Outcome: Ongoing (see interventions/notes)  04/19/2021 0048 by Catarina Hartshorn, RN  Outcome: Ongoing (see interventions/notes)  Goal: Absence of Device-Related Skin and Tissue Injury  04/19/2021 0743 by Catarina Hartshorn, RN  Outcome: Ongoing (see interventions/notes)  04/19/2021 0048 by Catarina Hartshorn, RN  Outcome: Ongoing (see interventions/notes)  Intervention: Maintain Skin and Tissue Health  Recent Flowsheet Documentation  Taken 04/18/2021 1930 by Catarina Hartshorn, RN  Device Skin Pressure Protection:   adhesive use limited   absorbent pad utilized/changed   skin-to-device areas padded  Goal: Absence of Ventilator-Induced Lung Injury  04/19/2021 0743 by Catarina Hartshorn, RN  Outcome: Ongoing (see interventions/notes)  04/19/2021 0048 by Catarina Hartshorn, RN  Outcome: Ongoing (see interventions/notes)  Intervention: Prevent Ventilator-Associated Pneumonia  Recent Flowsheet Documentation  Taken 04/18/2021 1930 by Catarina Hartshorn, RN  Head of Bed [facility]) Positioning: HOB at 45 degrees     Problem: Fall Injury Risk  Goal: Absence of Fall and Fall-Related Injury  04/19/2021 0743 by Catarina Hartshorn, RN  Outcome: Ongoing (see interventions/notes)  04/19/2021 0048 by Catarina Hartshorn, RN  Outcome: Ongoing (see interventions/notes)  Intervention: Promote Injury-Free Environment  Recent Flowsheet Documentation  Taken 04/19/2021  0600 by Catarina Hartshorn, RN  Safety Promotion/Fall Prevention:   fall prevention program  maintained   activity supervised   elopement precautions initiated   muscle strengthening facilitated   motion sensor pad activated   nonskid shoes/slippers when out of bed   safety round/check completed  Taken 04/19/2021 0430 by Catarina Hartshorn, RN  Safety Promotion/Fall Prevention:   elopement precautions initiated   activity supervised   fall prevention program maintained   muscle strengthening facilitated   motion sensor pad activated   nonskid shoes/slippers when out of bed   safety round/check completed  Taken 04/19/2021 0200 by Catarina Hartshorn, RN  Safety Promotion/Fall Prevention:   activity supervised   elopement precautions initiated   fall prevention program maintained   muscle strengthening facilitated   motion sensor pad activated   nonskid shoes/slippers when out of bed   safety round/check completed  Taken 04/19/2021 0000 by Catarina Hartshorn, RN  Safety Promotion/Fall Prevention:   activity supervised   elopement precautions initiated   fall prevention program maintained   muscle strengthening facilitated   motion sensor pad activated   nonskid shoes/slippers when out of bed   safety round/check completed  Taken 04/18/2021 2200 by Catarina Hartshorn, RN  Safety Promotion/Fall Prevention:   activity supervised   elopement precautions initiated   fall prevention program maintained   muscle strengthening facilitated   motion sensor pad activated   nonskid shoes/slippers when out of bed   safety round/check completed  Taken 04/18/2021 2030 by Catarina Hartshorn, RN  Safety Promotion/Fall Prevention:   activity supervised   elopement precautions initiated   fall prevention program maintained   muscle strengthening facilitated   motion sensor pad activated   nonskid shoes/slippers when out of bed   safety round/check completed  Taken 04/18/2021 1930 by Catarina Hartshorn, RN  Safety Promotion/Fall Prevention:   elopement precautions initiated   activity supervised   fall prevention program maintained   muscle strengthening  facilitated   motion sensor pad activated   nonskid shoes/slippers when out of bed   safety round/check completed     Problem: Skin Injury Risk Increased  Goal: Skin Health and Integrity  04/19/2021 0743 by Catarina Hartshorn, RN  Outcome: Ongoing (see interventions/notes)  04/19/2021 0048 by Catarina Hartshorn, RN  Outcome: Ongoing (see interventions/notes)  Intervention: Optimize Skin Protection  Recent Flowsheet Documentation  Taken 04/18/2021 1930 by Catarina Hartshorn, RN  Pressure Reduction Techniques: frequent weight shift encouraged  Pressure Reduction Devices: (L.M,H,VH) Specialty Bed utilized  Skin Protection:   preventative decubiti skin protection foam dressing applied/intact   skin-to-device areas padded   transparent dressing maintained   tubing/devices free from skin contact   adhesive use limited   incontinence pads utilized  Activity Management:   dorsiflexion, plantar flexion encouraged   activity adjusted per tolerance   activity clustered for rest periods   activity encouraged   ROM, active encouraged  Head of Bed (HOB) Positioning: HOB at 45 degrees     Problem: Parenteral Nutrition  Goal: Effective Intravenous Nutrition Therapy Delivery  04/19/2021 0743 by Catarina Hartshorn, RN  Outcome: Ongoing (see interventions/notes)  04/19/2021 0048 by Catarina Hartshorn, RN  Outcome: Ongoing (see interventions/notes)

## 2021-04-19 NOTE — Care Plan (Signed)
04/19/21 1105   Therapist Pager   PT Assigned/ Pager # Einar [place] [phone]   Rehab Session   Document Type rehab contact note   Total PT Minutes: 0   Basic Mobility Am-PAC/6Clicks Score (APPROVED PT Staff, [place], [place], [place], [place], and FMT)   Patient Mobility Barrier Patient unstable or too ill to participate  (pt actively vomiting- willing to try this afternoon after additional meds)   Physical Therapy Clinical Impression   Criteria for Skilled Therapeutic yes     Theodosia Blender, PT  Pager: [phone]

## 2021-04-19 NOTE — Care Management Notes (Signed)
[facility] Management Note    Patient Name: [name]  Date of Birth: 1945-08-21  Sex: male  Date/Time of Admission: 04/03/2021  2:01 PM  Room/Bed: 14/A  Payor: MEDICARE / Plan: MEDICARE PART A AND B / Product Type: Medicare /    LOS: 16 days   Primary Care Providers:  [facility] ([facility])    Admitting Diagnosis:  Intra abdominal hemorrhage [R58]    Assessment:      04/19/21 1515   Assessment Details   Assessment Type Continued Assessment   Date of Care Management Update 04/19/21   Date of Next DCP Update 04/22/21   Care Management Plan   Discharge Planning Status plan in progress   Projected Discharge Date 04/21/21   Discharge plan discussed with: Patient   Discharge Needs Assessment   Discharge Facility/Level of Care Needs Acute Rehab Placement/Return (not psych)(code 62)     Per chart review, continue Daptomycin and meropenum (stop date 4/7), IVC filter placement 3/17, Aggressive pulm toilet, wean O2, restart heparin gtt at low intensity, phosphorus replaced, Midline fistula with ostomy bag added to monitor output, PT/OT recs IPR on DC.  Will follow for DC planning.    Discharge Plan:  Acute Rehab Placement/Return (not psych) (code 19)    The patient will continue to be evaluated for developing discharge needs.     Case Manager: Darron Doom., SOCIAL WORKER  Phone: [phone]

## 2021-04-19 NOTE — Care Plan (Signed)
04/19/21 1343   Therapist Pager   OT Assigned/ Pager # Carlicia Leavens [phone]   Rehab Session   Document Type rehab contact note   Total OT Minutes: 0   Daily Activity AM-PAC/6-clicks Score   Patient Mobility Barrier Patient declined treatment  (still feeling nauseated, declined treatment)

## 2021-04-19 NOTE — Care Plan (Signed)
Fall / skin / isolation precautions. Geophysicist/field seismologist. Frequent repositioning for comfort and to prevent skin breakdown. Voiding adequate amounts of clear yellow in urinal during this shift. Diabetic diet and tolerating well. R ABD JP drain with no output, R ABD accordion drain with SS output, mid ABD colostomy bag intact with liquid brown stool. RUE double-lumen PICC with TPN, MIVF, and abx continued. RA and maintaining oxygen saturations. OOB x2 and Denna Haggard to chair.    Problem: Adult Inpatient Plan of Care  Goal: Plan of Care Review  Outcome: Ongoing (see interventions/notes)  Goal: Patient-Specific Goal (Individualized)  Outcome: Ongoing (see interventions/notes)  Goal: Absence of Hospital-Acquired Illness or Injury  Outcome: Ongoing (see interventions/notes)  Intervention: Identify and Manage Fall Risk  Recent Flowsheet Documentation  Taken 04/19/2021 0000 by Catarina Hartshorn, RN  Safety Promotion/Fall Prevention:   activity supervised   elopement precautions initiated   fall prevention program maintained   muscle strengthening facilitated   motion sensor pad activated   nonskid shoes/slippers when out of bed   safety round/check completed  Taken 04/18/2021 2200 by Catarina Hartshorn, RN  Safety Promotion/Fall Prevention:   activity supervised   elopement precautions initiated   fall prevention program maintained   muscle strengthening facilitated   motion sensor pad activated   nonskid shoes/slippers when out of bed   safety round/check completed  Taken 04/18/2021 2030 by Catarina Hartshorn, RN  Safety Promotion/Fall Prevention:   activity supervised   elopement precautions initiated   fall prevention program maintained   muscle strengthening facilitated   motion sensor pad activated   nonskid shoes/slippers when out of bed   safety round/check completed  Taken 04/18/2021 1930 by Catarina Hartshorn, RN  Safety Promotion/Fall Prevention:   elopement precautions initiated   activity supervised   fall prevention program maintained    muscle strengthening facilitated   motion sensor pad activated   nonskid shoes/slippers when out of bed   safety round/check completed  Intervention: Prevent Skin Injury  Recent Flowsheet Documentation  Taken 04/18/2021 1930 by Catarina Hartshorn, RN  Body Position: positioned/repositioned independently  Skin Protection:   preventative decubiti skin protection foam dressing applied/intact   skin-to-device areas padded   transparent dressing maintained   tubing/devices free from skin contact   adhesive use limited   incontinence pads utilized  Intervention: Prevent and Manage VTE (Venous Thromboembolism) Risk  Recent Flowsheet Documentation  Taken 04/18/2021 1930 by Catarina Hartshorn, RN  VTE Prevention/Management:   dorsiflexion/plantar flexion performed   ambulation promoted   sequential compression devices on  Intervention: Prevent Infection  Recent Flowsheet Documentation  Taken 04/18/2021 1930 by Catarina Hartshorn, RN  Infection Prevention:   environmental surveillance performed   barrier precautions utilized   equipment surfaces disinfected   glycemic control managed   promote handwashing   rest/sleep promoted   single patient room provided  Goal: Optimal Comfort and Wellbeing  Outcome: Ongoing (see interventions/notes)  Intervention: Provide Person-Centered Care  Recent Flowsheet Documentation  Taken 04/18/2021 1930 by Catarina Hartshorn, RN  Trust Relationship/Rapport:   emotional support provided   care explained   questions answered   empathic listening provided   questions encouraged   reassurance provided   thoughts/feelings acknowledged  Goal: Rounds/Family Conference  Outcome: Ongoing (see interventions/notes)     Problem: Mechanical Ventilation Invasive  Goal: Effective Communication  Outcome: Ongoing (see interventions/notes)  Intervention: Ensure Effective Communication  Recent Flowsheet Documentation  Taken 04/18/2021 1930 by Catarina Hartshorn, RN  Trust Relationship/Rapport:   emotional support  provided   care explained    questions answered   empathic listening provided   questions encouraged   reassurance provided   thoughts/feelings acknowledged  Diversional Activities:   television   smartphone  Goal: Optimal Device Function  Outcome: Ongoing (see interventions/notes)  Intervention: Optimize Device Care and Function  Recent Flowsheet Documentation  Taken 04/18/2021 1930 by Catarina Hartshorn, RN  Airway Safety Measures: suction at bedside  Goal: Mechanical Ventilation Liberation  Outcome: Ongoing (see interventions/notes)  Goal: Optimal Nutrition Delivery  Outcome: Ongoing (see interventions/notes)  Goal: Absence of Device-Related Skin and Tissue Injury  Outcome: Ongoing (see interventions/notes)  Intervention: Maintain Skin and Tissue Health  Recent Flowsheet Documentation  Taken 04/18/2021 1930 by Catarina Hartshorn, RN  Device Skin Pressure Protection:   adhesive use limited   absorbent pad utilized/changed   skin-to-device areas padded  Goal: Absence of Ventilator-Induced Lung Injury  Outcome: Ongoing (see interventions/notes)  Intervention: Prevent Ventilator-Associated Pneumonia  Recent Flowsheet Documentation  Taken 04/18/2021 1930 by Catarina Hartshorn, RN  Head of Bed [facility]) Positioning: HOB at 45 degrees     Problem: Fall Injury Risk  Goal: Absence of Fall and Fall-Related Injury  Outcome: Ongoing (see interventions/notes)  Intervention: Promote Injury-Free Environment  Recent Flowsheet Documentation  Taken 04/19/2021 0000 by Catarina Hartshorn, RN  Safety Promotion/Fall Prevention:   activity supervised   elopement precautions initiated   fall prevention program maintained   muscle strengthening facilitated   motion sensor pad activated   nonskid shoes/slippers when out of bed   safety round/check completed  Taken 04/18/2021 2200 by Catarina Hartshorn, RN  Safety Promotion/Fall Prevention:   activity supervised   elopement precautions initiated   fall prevention program maintained   muscle strengthening facilitated   motion sensor pad activated    nonskid shoes/slippers when out of bed   safety round/check completed  Taken 04/18/2021 2030 by Catarina Hartshorn, RN  Safety Promotion/Fall Prevention:   activity supervised   elopement precautions initiated   fall prevention program maintained   muscle strengthening facilitated   motion sensor pad activated   nonskid shoes/slippers when out of bed   safety round/check completed  Taken 04/18/2021 1930 by Catarina Hartshorn, RN  Safety Promotion/Fall Prevention:   elopement precautions initiated   activity supervised   fall prevention program maintained   muscle strengthening facilitated   motion sensor pad activated   nonskid shoes/slippers when out of bed   safety round/check completed     Problem: Skin Injury Risk Increased  Goal: Skin Health and Integrity  Outcome: Ongoing (see interventions/notes)  Intervention: Optimize Skin Protection  Recent Flowsheet Documentation  Taken 04/18/2021 1930 by Catarina Hartshorn, RN  Pressure Reduction Techniques: frequent weight shift encouraged  Pressure Reduction Devices: (L.M,H,VH) Specialty Bed utilized  Skin Protection:   preventative decubiti skin protection foam dressing applied/intact   skin-to-device areas padded   transparent dressing maintained   tubing/devices free from skin contact   adhesive use limited   incontinence pads utilized  Activity Management:   dorsiflexion, plantar flexion encouraged   activity adjusted per tolerance   activity clustered for rest periods   activity encouraged   ROM, active encouraged  Head of Bed (HOB) Positioning: HOB at 45 degrees     Problem: Parenteral Nutrition  Goal: Effective Intravenous Nutrition Therapy Delivery  Outcome: Ongoing (see interventions/notes)

## 2021-04-20 ENCOUNTER — Inpatient Hospital Stay (HOSPITAL_COMMUNITY): Payer: 59

## 2021-04-20 DIAGNOSIS — Z95828 Presence of other vascular implants and grafts: Secondary | ICD-10-CM

## 2021-04-20 DIAGNOSIS — I2694 Multiple subsegmental pulmonary emboli without acute cor pulmonale: Secondary | ICD-10-CM

## 2021-04-20 DIAGNOSIS — J90 Pleural effusion, not elsewhere classified: Secondary | ICD-10-CM

## 2021-04-20 DIAGNOSIS — Z978 Presence of other specified devices: Secondary | ICD-10-CM

## 2021-04-20 DIAGNOSIS — R935 Abnormal findings on diagnostic imaging of other abdominal regions, including retroperitoneum: Secondary | ICD-10-CM

## 2021-04-20 DIAGNOSIS — Z90411 Acquired partial absence of pancreas: Secondary | ICD-10-CM

## 2021-04-20 DIAGNOSIS — N3289 Other specified disorders of bladder: Secondary | ICD-10-CM

## 2021-04-20 DIAGNOSIS — R9389 Abnormal findings on diagnostic imaging of other specified body structures: Secondary | ICD-10-CM

## 2021-04-20 DIAGNOSIS — J9811 Atelectasis: Secondary | ICD-10-CM

## 2021-04-20 DIAGNOSIS — Z9081 Acquired absence of spleen: Secondary | ICD-10-CM

## 2021-04-20 LAB — MAGNESIUM: MAGNESIUM: 2.1 mg/dL (ref 1.8–2.6)

## 2021-04-20 LAB — URINALYSIS, MACROSCOPIC
BILIRUBIN: NEGATIVE mg/dL
BLOOD: NEGATIVE mg/dL
COLOR: NORMAL
GLUCOSE: >=1000 mg/dL — AB
KETONES: NEGATIVE mg/dL
LEUKOCYTES: NEGATIVE WBCs/uL
NITRITE: NEGATIVE
PH: 6 (ref 5.0–8.0)
PROTEIN: 50 mg/dL — AB
SPECIFIC GRAVITY: 1.018 (ref 1.005–1.030)
UROBILINOGEN: NEGATIVE mg/dL

## 2021-04-20 LAB — URINALYSIS, MICROSCOPIC
HYALINE CASTS: 3 /lpf (ref <4.0)
RBCS: 1 /hpf (ref <6.0)
WBCS: 1 /hpf (ref <4.0)

## 2021-04-20 LAB — PTT (PARTIAL THROMBOPLASTIN TIME)
APTT: 44.7 seconds — ABNORMAL HIGH (ref 24.2–37.5)
APTT: 40.9 seconds — ABNORMAL HIGH (ref 24.2–37.5)
APTT: 35.5 seconds (ref 24.2–37.5)

## 2021-04-20 LAB — BASIC METABOLIC PANEL
ANION GAP: 5 mmol/L (ref 4–13)
BUN/CREA RATIO: 20 (ref 6–22)
BUN: 13 mg/dL (ref 8–25)
CALCIUM: 7.5 mg/dL — ABNORMAL LOW (ref 8.8–10.2)
CHLORIDE: 102 mmol/L (ref 96–111)
CO2 TOTAL: 26 mmol/L (ref 23–31)
CREATININE: 0.64 mg/dL — ABNORMAL LOW (ref 0.75–1.35)
ESTIMATED GFR: >90 mL/min/BSA (ref >=60)
GLUCOSE: 176 mg/dL — ABNORMAL HIGH (ref 65–125)
POTASSIUM: 3.8 mmol/L (ref 3.5–5.1)
SODIUM: 133 mmol/L — ABNORMAL LOW (ref 136–145)

## 2021-04-20 LAB — CBC
HCT: 23 % — ABNORMAL LOW (ref 38.9–52.0)
HGB: 7.3 g/dL — ABNORMAL LOW (ref 13.4–17.5)
MCH: 29.3 pg (ref 26.0–32.0)
MCHC: 31.7 g/dL (ref 31.0–35.5)
MCV: 92.4 fL (ref 78.0–100.0)
MPV: 10.7 fL (ref 8.7–12.5)
PLATELETS: 616 10*3/uL — ABNORMAL HIGH (ref 150–400)
RBC: 2.49 10*6/uL — ABNORMAL LOW (ref 4.50–6.10)
RDW-CV: 17.4 % — ABNORMAL HIGH (ref 11.5–15.5)
WBC: 16 10*3/uL — ABNORMAL HIGH (ref 3.7–11.0)

## 2021-04-20 LAB — HEPATIC FUNCTION PANEL
ALBUMIN: 1.4 g/dL — ABNORMAL LOW (ref 3.4–4.8)
ALKALINE PHOSPHATASE: 128 U/L — ABNORMAL HIGH (ref 45–115)
ALT (SGPT): 57 U/L — ABNORMAL HIGH (ref 10–55)
AST (SGOT): 44 U/L (ref 8–45)
BILIRUBIN DIRECT: 0.1 mg/dL (ref 0.1–0.4)
BILIRUBIN TOTAL: 0.2 mg/dL — ABNORMAL LOW (ref 0.3–1.3)
PROTEIN TOTAL: 5.1 g/dL — ABNORMAL LOW (ref 6.0–8.0)

## 2021-04-20 LAB — POC BLOOD GLUCOSE (RESULTS)
GLUCOSE, POC: 220 mg/dl — ABNORMAL HIGH (ref 70–105)
GLUCOSE, POC: 182 mg/dl — ABNORMAL HIGH (ref 70–105)
GLUCOSE, POC: 203 mg/dl — ABNORMAL HIGH (ref 70–105)
GLUCOSE, POC: 218 mg/dl — ABNORMAL HIGH (ref 70–105)

## 2021-04-20 LAB — PHOSPHORUS: PHOSPHORUS: 2.3 mg/dL (ref 2.3–4.0)

## 2021-04-20 MED ORDER — DIATRIZOATE MEGLUMINE-DIATRIZOATE SODIUM 66 %-10 % ORAL SOLUTION
8.0000 mL | ORAL | Status: DC
Start: 2021-04-20 — End: 2021-04-23

## 2021-04-20 MED ORDER — WATER FOR INJECTION, STERILE INTRAVENOUS SOLUTION
INTRAVENOUS | Status: AC
Start: 2021-04-20 — End: 2021-04-21
  Filled 2021-04-20: qty 850

## 2021-04-20 MED ORDER — SODIUM PHOSPHATE 3 MMOL/ML INTRAVENOUS SOLUTION
20.0000 mmol | Freq: Once | INTRAVENOUS | Status: AC
Start: 2021-04-20 — End: 2021-04-20
  Administered 2021-04-20: 0 mmol via INTRAVENOUS
  Administered 2021-04-20: 20 mmol via INTRAVENOUS
  Filled 2021-04-20: qty 6.67

## 2021-04-20 MED ORDER — IOPAMIDOL 370 MG IODINE/ML (76 %) INTRAVENOUS SOLUTION
91.0000 mL | INTRAVENOUS | Status: AC
Start: 2021-04-20 — End: 2021-04-20
  Administered 2021-04-20: 91 mL via INTRAVENOUS

## 2021-04-20 MED ORDER — INSULIN REGULAR HUMAN 100 UNIT/ML INJECTION SSIP
0.0000 [IU] | INJECTION | Freq: Four times a day (QID) | SUBCUTANEOUS | Status: DC | PRN
Start: 2021-04-20 — End: 2021-05-06
  Administered 2021-04-20: 2 [IU] via SUBCUTANEOUS
  Administered 2021-04-21 (×3): 4 [IU] via SUBCUTANEOUS
  Administered 2021-04-22: 8 [IU] via SUBCUTANEOUS
  Administered 2021-04-22 (×2): 4 [IU] via SUBCUTANEOUS
  Administered 2021-04-23: 8 [IU] via SUBCUTANEOUS
  Administered 2021-04-23 (×2): 4 [IU] via SUBCUTANEOUS
  Administered 2021-04-23: 8 [IU] via SUBCUTANEOUS
  Administered 2021-04-24: 4 [IU] via SUBCUTANEOUS
  Administered 2021-04-24: 8 [IU] via SUBCUTANEOUS
  Administered 2021-04-25 – 2021-04-30 (×8): 4 [IU] via SUBCUTANEOUS
  Administered 2021-04-30 – 2021-05-01 (×2): 8 [IU] via SUBCUTANEOUS
  Administered 2021-05-01 – 2021-05-04 (×11): 4 [IU] via SUBCUTANEOUS
  Administered 2021-05-05: 8 [IU] via SUBCUTANEOUS
  Administered 2021-05-05 (×2): 4 [IU] via SUBCUTANEOUS
  Administered 2021-05-06 (×2): 8 [IU] via SUBCUTANEOUS
  Filled 2021-04-20: qty 300

## 2021-04-20 NOTE — Care Plan (Signed)
[facility]  Physical Therapy Progress Note      Patient Name: [name]  Date of Birth: 1945-08-28  Height:  175.3 cm ('5\' 9"'$ )  Weight:  84.4 kg (186 lb 1.1 oz)  Room/Bed: 14/A  Payor: MEDICARE / Plan: MEDICARE PART A AND B / Product Type: Medicare /     Assessment:     PT treatment complete, pt tolerating second session well this day. Compeltes bed mobility to EOB MIn A, LE TE for strengthening, postural control mobility. Pt limited by endurance, balance, strength, flexibility. Anticipate D/C to inpatient rehab when medically appropriate.    Discharge Needs:   Equipment Recommendation: TBD      Discharge Disposition: inpatient rehabilitation facility    JUSTIFICATION OF DISCHARGE RECOMMENDATION   Based on current diagnosis, functional performance prior to admission, and current functional performance, this patient requires continued PT services in inpatient rehabilitation facility in order to achieve significant functional improvements in these deficit areas: aerobic capacity/endurance, gait, locomotion, and balance, muscle performance.      Plan:   Continue to follow patient according to established plan of care.  The risks/benefits of therapy have been discussed with the patient/caregiver and he/she is in agreement with the established plan of care.     Subjective & Objective:        04/20/21 1505   Therapist Pager   PT Assigned/ Pager # Einar Pheasant [phone]   Rehab Session   Document Type therapy progress note (daily note)   Total PT Minutes: 23   Patient Effort good   Symptoms Noted During/After Treatment fatigue   General Information   Patient Profile Reviewed yes   Medical Lines PIV Line;Telemetry;Peripheral Drain   Respiratory Status room air   Existing Precautions/Restrictions full code;fall precautions   Mutuality/Individual Preferences   Individualized Care Needs OOB with Denna Haggard   Pre Treatment Status   Pre Treatment Patient Status Patient supine in bed;Call light  within reach;Telephone within reach;Sitter select activated   Support Present Pre Treatment  Family present   Cognitive Assessment/Interventions   Behavior/Mood Observations behavior appropriate to situation, WNL/WFL   Orientation Status oriented x 4   Attention WNL/WFL   Follows Commands WNL   Vital Signs   Vitals Comment VSS   Pain Assessment   Pre/Posttreatment Pain Comment no c/o pain   Bed Mobility Assessment/Treatment   Supine-Sit Independence minimum assist (75% patient effort);verbal cues required   Sit to Supine, Independence moderate assist (50% patient effort)   Safety Issues decreased use of arms for pushing/pulling;decreased use of legs for bridging/pushing   Impairments balance impaired;endurance;strength decreased   Roll Right Independence minimum assist (75% patient effort)   Roll Left Independence minimum assist (75% patient effort)   Therapeutic Exercise/Activity   Comment Patient instructed in LE strengthening with LAQ, marching, ankle rocking, unilateral leaning pushup 2x10 reps.   Post Treatment Status   Post Treatment Patient Status Patient supine in bed;Call light within reach;Telephone within reach;Sitter select activated   Support Present Post Treatment  Family present   Plan of Care Review   Plan Of Care Reviewed With patient;spouse   Basic Mobility Am-PAC/6Clicks Score (APPROVED PT Staff, [facility], [facility] ONLY, [place], [facility], and [facility])   Turning in bed without bedrails 3   Lying on back to sitting on edge of flat bed 3   Moving to and from a bed to a chair 3   Standing up from chair 3   Walk in room  2   Climbing 3-5 steps with railing 1   6 Clicks Raw Score total 15   Standardized (t-scale) score 36.97   CMS 0-100% Score 50.4   CMS Modifier CK   Patient Mobility Goal (JHHLM) 5- Stand 1 minute or more 3X/day   Exercise/Activity Level Performed 3- Sat at edge of bed   Physical Therapy Clinical Impression   Assessment PT treatment complete, pt tolerating second session well this day. Compeltes bed  mobility to EOB MIn A, LE TE for strengthening, postural control mobility. Pt limited by endurance, balance, strength, flexibility. Anticipate D/C to inpatient rehab when medically appropriate.   Anticipated Equipment Needs at Discharge (PT) TBD   Anticipated Discharge Disposition inpatient rehabilitation facility         Therapist:   Theodosia Blender, PT   Pager #: [phone]

## 2021-04-20 NOTE — Care Plan (Signed)
[facility]  Occupational Therapy Progress Note    Patient Name: TRAVAS SCHEXNAYDER  Date of Birth: 10-28-1945  Height:  175.3 cm ('5\' 9"'$ )  Weight:  84.4 kg (186 lb 1.1 oz)  Room/Bed: 14/A  Payor: MEDICARE / Plan: MEDICARE PART A AND B / Product Type: Medicare /     Assessment:    OT tx at b/s on 8NE.  Coordinated optimal time with RN prior to treatment based on prns for pain, nausea, etc.  Pt well motivated to participate.  Appreciates extra attention to drains and lines during intervention.  Improved transfer status noted this session as well as unsupported sitting and supported standing tolerance.  Will continue to follow.  Continue to recommend inpt rehab disposition to facilitate safe d/c home with spouse given pt's medical complexity and rehab needs.      Discharge Needs:   Equipment Recommendation: to be determined    Discharge Disposition:  inpatient rehabilitation facility     JUSTIFICATION OF DISCHARGE RECOMMENDATION   Based on current diagnosis, functional performance prior to admission, and current functional performance, this patient requires continued OT services in inpatient rehabilitation facility in order to achieve significant functional improvements.    Plan:   Continue to follow patient according to established plan of care.  The risks/benefits of therapy have been discussed with the patient/caregiver and he/she is in agreement with the established plan of care.     Subjective & Objective:      04/20/21 1040   Therapist Pager   OT Assigned/ Pager # Areana Kosanke [phone]   Rehab Session   Document Type therapy progress note (daily note)   Total OT Minutes: 25   Patient Effort good   Symptoms Noted During/After Treatment fatigue   General Information   Patient Profile Reviewed yes   Medical Lines Peripheral Drain;PIV Line;Telemetry;PICC Line   Respiratory Status room air   Existing Precautions/Restrictions fall precautions;full code   Pre Treatment Status   Pre Treatment  Patient Status Patient supine in bed;Telephone within reach;Call light within reach;Nurse approved session;Venodynes in place and activated   Support Present Pre Treatment  None   Communication Pre Treatment  Nurse   Mutuality/Individual Preferences   Individualized Care Needs OOB via Clarise Cruz stedy with assist   Plan of Care Reviewed With patient;spouse  (spouse arrived at the end of our session)   Vital Signs   O2 Delivery Pre Treatment room air   O2 Delivery Post Treatment room air   Pain Assessment   Pre/Posttreatment Pain Comment no c/o pain- does likely have pain during transitional movements based on verbalizations, but does not state   Coping/Psychosocial   Observed Emotional State calm;cooperative   Verbalized Emotional State acceptance   Family/Support System   Family/Support Persons spouse   Involvement in Care at bedside;attentive to patient;interacting with patient;supportive of patient   Coping/Psychosocial Response Interventions   Plan Of Care Reviewed With patient;spouse   Trust Relationship/Rapport care explained;choices provided;questions encouraged;empathic listening provided;reassurance provided;thoughts/feelings acknowledged   Cognitive Assessment/Interventions   Behavior/Mood Observations alert;cooperative   Orientation Status oriented x 4   Attention WNL/WFL   Follows Commands WNL   Bed Mobility Assessment/Treatment   Safety Issues impaired trunk control for bed mobility   Roll Left Independence minimum assist (75% patient effort)   Sidelying to Sit, Independence minimum assist (75% patient effort)   Sit to Sidelying, Independence moderate assist (50% patient effort)   Transfer Assessment/Treatment   Sit-Stand Independence minimum assist (75%  patient effort);nonverbal cues required (demo/gesture)   Stand-Sit Independence minimum assist (75% patient effort);verbal cues required   Sit-Stand-Sit, Assist Device walker, front wheeled   Transfer Comment would likely require assist of a 2nd for pivot  transfers for line/drain mgmt and safety   ADL Assessment/Intervention   ADL Comments Pt deferred participation in grooming while sitting edge of bed   Balance Skill Training   Comment FWW in standing   Sitting Balance: Static fair balance   Sitting, Dynamic (Balance) fair - balance   Sit-to-Stand Balance fair - balance   Standing Balance: Static fair - balance   Standing Balance: Dynamic fair - balance   Therapeutic Exercise/Activity   Comment tolerates edge of bed sitting > 15 min unsupported, standing > 1 min with FWW and assist   Post Treatment Status   Post Treatment Patient Status Patient supine in bed;Call light within reach;Telephone within reach;Venodynes in place and activated  (returned to bed due to pt to have CT scan at 11)   Support Present Post Treatment  Family present   Communication Post Treatement Nurse   Clinical Impression   Functional Level at Time of Session OT tx at b/s on 8NE.  Coordinated optimal time with RN prior to treatment based on prns for pain, nausea, etc.  Pt well motivated to participate.  Appreciates extra attention to drains and lines during intervention.  Improved transfer status noted this session as well as unsupported sitting and supported standing tolerance.  Will continue to follow.  Continue to recommend inpt rehab disposition to facilitate safe d/c home with spouse given pt's medical complexity and rehab needs.   Anticipated Equipment Needs at Discharge to be determined   Anticipated Discharge Disposition inpatient rehabilitation facility   Highest level of Mobility score   Exercise/Activity Level Performed 5- Static standing>1 minute         Therapist:   Forbes Cellar, OT  Pager #: [phone]

## 2021-04-20 NOTE — Progress Notes (Signed)
[facility]  Surgical Oncology  Progress Note      Amarri, Satterly, 76 y.o. male  Date of Birth:  [birth year]  Date of Admission:  04/03/2021  Date of service: 04/20/2021    Assessment:  This is a 76 y.o. 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 He underwent massive transfusion protocol and ex lap on 04/03/21 at an outside facility with ligation of a bleeding vessel in the liver bed. Patient was subsequently transferred to [facility] postoperaively, intubated. Patient was subsequently extubated later that day on 3/4. 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      Plan/Recommendations:  - Hematochezia resolved  - Diet: Diabetic diet, TPN  - Fever, tachycardia   - Blood cultures 3/20 pending   - CT C/A/P with PO contrast, CTA chest   - UA with few yeast  - Peripancreatic fluid collections              - IR consulted; RUQ drain placement and empiric embolization 3/10              - Blood cultures 3/9: + pseudomonas    - Cultures form R JP: Pseudomonas and enterococcus              - Repeat Blood cultures 3/12 negative   - ID consulted; continue Daptomycin and meropenum (stop date 4/7)  - PE   - s/p IVC filter placement 3/17   - Aggressive pulm toilet, supp off O2   - Continue heparin gtt at low intensity  - Labs: reviewed, stable  - IVF: discontinued  - 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 Reports feeling well this morning and is tolerating a diabetic diet without N/V. Has some pain at the surface where the JP enters the skin but otherwise voices no complaints.     Objective  Filed Vitals:    04/19/21 1514 04/19/21 2004 04/19/21 2330 04/20/21 0345   BP:   116/78 (!) 146/83   Pulse:   93 (!) 102   Resp: 16  (!) 25 (!) 28   Temp: 37 C (98.6  F) 36.8 C (98.2 F) 36.9 C (98.5 F) 37.2 C (98.9 F)   SpO2:   94% 93%     Physical Exam:   GEN:  AOx4, resting in bed, no acute distress  PULM: Normal respiratory effort.   CV:  Tachycardic, regular rhythm   ABD:   Abdomen soft, minimal ttp RUQ, nondistended. JP in place in RUQ with minimal purulent output,  Lower midline incision with feculent drainage present with ostomy bag over it.  Pigtail catheter present with SS output.  MS: Atraumatic. Moves all extremities.  NEURO:   Alert and oriented to person, place and time.  Cranial nerves grossly intact.    Integumentary:  Pink, warm, and dry  PSYCHOSOCIAL: Pleasant.  Normal affect.     Labs  Results for orders placed or performed during the hospital encounter of 04/03/21 (from the past 24 hour(s))   URINALYSIS, MACROSCOPIC AND MICROSCOPIC W/CULTURE REFLEX    Specimen: Urine, Site not specified    Narrative    The following orders were created for panel order URINALYSIS, MACROSCOPIC AND MICROSCOPIC W/CULTURE REFLEX.  Procedure                               Abnormality         Status                     ---------                               -----------         ------                     URINALYSIS, MACROSCOPIC[[id number]]      Abnormal            Final result               URINALYSIS, MICROSCOPIC[[id number]]      Abnormal            Final result                 Please view results for these tests on the individual orders.   URINALYSIS, MACROSCOPIC   Result Value Ref Range    SPECIFIC GRAVITY 1.018 1.005 - 1.030    GLUCOSE >=1000 (A) Negative mg/dL    PROTEIN 50 (A) Negative mg/dL    BILIRUBIN Negative Negative mg/dL    UROBILINOGEN Negative Negative mg/dL    PH 6.0 5.0 - 8.0    BLOOD Negative Negative mg/dL    KETONES Negative Negative mg/dL    NITRITE Negative Negative    LEUKOCYTES Negative Negative WBCs/uL    APPEARANCE Clear Clear    COLOR Normal (Yellow) Normal (Yellow)   URINALYSIS, MICROSCOPIC   Result Value Ref Range    WBCS 1.0 <4.0 /hpf    RBCS 1.0 <6.0  /hpf    BACTERIA Occasional or less Occasional or less /hpf    YEAST Few (A) Occasional or less /hpf    HYALINE CASTS 3.0 <4.0 /lpf    SQUAMOUS EPITHELIAL CELLS Occasional or less Occasional or less /lpf    MUCOUS Light Light /lpf   PTT (PARTIAL THROMBOPLASTIN TIME) - ONCE   Result Value Ref Range    APTT 41.0 (H) 24.2 - 37.5 seconds    Narrative    Therapeutic range for unfractionated heparin is 60-100 seconds.   PTT (PARTIAL THROMBOPLASTIN TIME)   Result Value Ref Range    APTT      Narrative    Therapeutic range for unfractionated heparin is 60-100 seconds.  Critically high result questioned by the ordering provider. Please re-order the test and draw a new specimen if clinically indicated.     PHOSPHORUS   Result Value Ref Range    PHOSPHORUS 2.3 2.3 - 4.0 mg/dL   CBC   Result Value Ref Range    WBC 16.0 (H) 3.7 - 11.0 x103/uL    RBC 2.49 (L) 4.50 - 6.10 x106/uL    HGB 7.3 (L) 13.4 - 17.5 g/dL    HCT 23.0 (L) 38.9 - 52.0 %    MCV 92.4 78.0 - 100.0 fL    MCH 29.3 26.0 - 32.0 pg    MCHC 31.7 31.0 - 35.5 g/dL    RDW-CV 17.4 (H) 11.5 - 15.5 %    PLATELETS 616 (H) 150 - 400 x103/uL    MPV 10.7 8.7 - 12.5 fL   MAGNESIUM   Result  Value Ref Range    MAGNESIUM 2.1 1.8 - 2.6 mg/dL   BASIC METABOLIC PANEL   Result Value Ref Range    SODIUM 133 (L) 136 - 145 mmol/L    POTASSIUM 3.8 3.5 - 5.1 mmol/L    CHLORIDE 102 96 - 111 mmol/L    CO2 TOTAL 26 23 - 31 mmol/L    ANION GAP 5 4 - 13 mmol/L    CALCIUM 7.5 (L) 8.8 - 10.2 mg/dL    GLUCOSE 176 (H) 65 - 125 mg/dL    BUN 13 8 - 25 mg/dL    CREATININE 0.64 (L) 0.75 - 1.35 mg/dL    BUN/CREA RATIO 20 6 - 22    ESTIMATED GFR >90 >=60 mL/min/BSA   HEPATIC FUNCTION PANEL   Result Value Ref Range    ALBUMIN 1.4 (L) 3.4 - 4.8 g/dL     ALKALINE PHOSPHATASE 128 (H) 45 - 115 U/L    ALT (SGPT) 57 (H) 10 - 55 U/L    AST (SGOT)  44 8 - 45 U/L    BILIRUBIN TOTAL 0.2 (L) 0.3 - 1.3 mg/dL    BILIRUBIN DIRECT 0.1 0.1 - 0.4 mg/dL    PROTEIN TOTAL 5.1 (L) 6.0 - 8.0 g/dL   PTT (PARTIAL THROMBOPLASTIN  TIME)   Result Value Ref Range    APTT 44.7 (H) 24.2 - 37.5 seconds    Narrative    Therapeutic range for unfractionated heparin is 60-100 seconds.   POC BLOOD GLUCOSE (RESULTS)   Result Value Ref Range    GLUCOSE, POC 227 (H) 70 - 105 mg/dl   POC BLOOD GLUCOSE (RESULTS)   Result Value Ref Range    GLUCOSE, POC 220 (H) 70 - 105 mg/dl   POC BLOOD GLUCOSE (RESULTS)   Result Value Ref Range    GLUCOSE, POC 182 (H) 70 - 105 mg/dl       I/O:  Date 04/19/21 0700 - 04/20/21 0659 04/20/21 0700 - 04/21/21 0659   Shift 0700-1459 1500-2259 2300-0659 24 Hour Total 0700-1459 1500-2259 2300-0659 24 Hour Total   INTAKE   P.O. 50  240 290         Oral 50  240 290       I.V.(mL/kg/hr)  0569.79(4.80) 547.09(0.81) 2115.46(1.04) 69.4   69.4     Heparin Volume  101.92 91.09 193.01 12.4   12.4     Volume Infused (adult custom parenteral nutrition)  1418  1418         Volume Infused (adult custom parenteral nutrition)  48.45 456 504.45 57   57   IV Piggyback 112 176  288         Volume (DAPTOmycin (CUBICIN) 800 mg in NS 50 mL IVPB)  66  66         Volume (sodium phosphate 30 mmol in D5W 100 mL IVPB) 112   112         Volume (sodium phosphate 30 mmol in D5W 100 mL IVPB)  110  110       Shift Total(mL/kg) 162(1.92) 1655.37(48.27) 787.09(9.33) 0786.75(44.92) 69.4(0.82)   69.4(0.82)   OUTPUT   Urine(mL/kg/hr)  550(0.81) 825(1.22) 1375(0.68) 150   150     Urine (Voided)  [phone] 150   150   Drains 390 265 340 995         JP Drain Output ([place] Drain Right;Upper Abdomen) 0 15 0 15         Drain Output (Drain (Miscellaneous) #  62F pigtail catheter for RLQ fluid collection Lower;Right;Anterior Abdomen) 340 250 290 880         Drain Output (Drain (Miscellaneous) peds ostomy bag Lower;Medial Abdomen) 50 0 50 100       Shift Total(mL/kg) 390(4.62) 815(9.66) 1165(13.8) 2370(28.08) 150(1.78)   150(1.78)   Weight (kg) 84.4 84.4 84.4 84.4 84.4 84.4 84.4 84.4       Radiology       Current Medications:  Current Facility-Administered  Medications   Medication Dose Route Frequency    acetaminophen (TYLENOL) tablet  650 mg Oral Q4H    adult custom parenteral nutrition   Intravenous Continuous    atorvastatin (LIPITOR) tablet  40 mg Oral Daily with Breakfast    benzonatate (TESSALON) capsule  100 mg Oral Q8H PRN    D5W 250 mL flush bag   Intravenous Q15 Min PRN    DAPTOmycin (CUBICIN) 800 mg in NS 50 mL IVPB  10 mg/kg (Adjusted) Intravenous Q24H    docusate sodium (COLACE) capsule  100 mg Oral 2x/day    elvitegravir-cobicistat-emtricitrabine-tenofovir (GENVOYA) 150-150-200-10 mg per tablet  1 Tablet Oral Daily    gabapentin (NEURONTIN) capsule  300 mg Oral 3x/day    heparin 25,000 units in NS 250 mL infusion  12 Units/kg/hr (Adjusted) Intravenous Continuous    HYDROmorphone (DILAUDID) 1 mg/mL injection  0.2 mg Intravenous Q3H PRN    meropenem (MERREM) 1 g in NS 100 mL IVPB  1 g Intravenous Q8H    methocarbamol (ROBAXIN) tablet  500 mg Oral 4x/day    multivitamin-minerals-folic acid-lycopene-lutein (CERTAVITE SENIOR) tablet  1 Tablet Oral Q24H    NS 250 mL flush bag   Intravenous Q15 Min PRN    NS flush syringe  2-6 mL Intracatheter Q8HRS    NS flush syringe  2-6 mL Intracatheter Q1 MIN PRN    NS flush syringe  10-30 mL Intracatheter Q8HRS    NS flush syringe  20-30 mL Intracatheter Q1 MIN PRN    NS flush syringe  10-30 mL Intracatheter Q8HRS    NS flush syringe  20-30 mL Intracatheter Q1 MIN PRN    ondansetron (ZOFRAN) 2 mg/mL injection  4 mg Intravenous Q6H PRN    oxyCODONE (ROXICODONE) immediate release tablet  5 mg Oral Q4H PRN    oxyCODONE (ROXICODONE) immediate releaste tablet  10 mg Oral Q4H PRN    pantoprazole (PROTONIX) delayed release tablet  40 mg Oral Q12H    sennosides-docusate sodium (SENOKOT-S) 8.6-'50mg'$  per tablet  1 Tablet Oral 2x/day    SSIP insulin R human (HUMULIN R) 100 units/mL injection  0-12 Units Subcutaneous 4x/day PRN       Stacey Drain, DO    PGY-1   General Surgery  Pager # [phone]      I saw and examined the patient.  I  reviewed the resident's note.  I agree with the findings and plan of care as documented in the resident's note.  Any exceptions/additions are edited/noted.    CT ordered for fever, leukocytosis.  Collections overall improved but persistent fluid around splenectomy bed with surgical drain coursing through it.  Will flush drain.  May need IR replacement.  Some fluid around midline near colonic fistula, however, would not remove further staples at this time as this may result in complete incisional disruption and make the fistula more difficult to manage.  Cont abx.     Aaron Edelman A. Cyndi Bender, MD, FACS

## 2021-04-20 NOTE — Care Plan (Signed)
[place]  Physical Therapy Progress Note      Patient Name: [name]  Date of Birth: 01-12-1946  Height:  175.3 cm ('5\' 9"'$ )  Weight:  84.4 kg (186 lb 1.1 oz)  Room/Bed: 14/A  Payor: MEDICARE / Plan: MEDICARE PART A AND B / Product Type: Medicare /     Assessment:     PT treatment complete, pt tolerating fairly well with good motiviation and mood. Demonstrates bed mobility to EOB Min A, Stands to EOB >1 min to FWW Min A and completes heel raises holding to FWW with c/o fatigue limiting after 5 reps. Patient returns to supine Mod A. Pt repositioned for comfort. OOB deferred until after imminent CT scan. Anticipate D/C to inpatient rehab when medically appropriate.    Discharge Needs:   Equipment Recommendation: TBD    Discharge Disposition: inpatient rehabilitation facility    JUSTIFICATION OF DISCHARGE RECOMMENDATION   Based on current diagnosis, functional performance prior to admission, and current functional performance, this patient requires continued PT services in inpatient rehabilitation facility in order to achieve significant functional improvements in these deficit areas: aerobic capacity/endurance, gait, locomotion, and balance, muscle performance.      Plan:   Continue to follow patient according to established plan of care.  The risks/benefits of therapy have been discussed with the patient/caregiver and he/she is in agreement with the established plan of care.     Subjective & Objective:        04/20/21 1041   Therapist Pager   PT Assigned/ Pager # Einar Pheasant [phone]   Rehab Session   Document Type therapy progress note (daily note)   Total PT Minutes: 25   Patient Effort good   Symptoms Noted During/After Treatment fatigue   General Information   Patient Profile Reviewed yes   Onset of Illness/Injury or Date of Surgery 04/03/21   Medical Lines PIV Line;Telemetry;Peripheral Drain   Respiratory Status room air   Existing Precautions/Restrictions full code;fall  precautions   Mutuality/Individual Preferences   Individualized Care Needs OOB with Denna Haggard   Pre Treatment Status   Pre Treatment Patient Status Patient supine in bed;Call light within reach;Telephone within reach   Support Present Pre Treatment  None   Communication Pre Treatment  Nurse   Communication Pre Treatment Comment cleared for session   Cognitive Assessment/Interventions   Behavior/Mood Observations behavior appropriate to situation, WNL/WFL   Orientation Status oriented x 4   Attention WNL/WFL   Follows Commands WNL   Vital Signs   Vitals Comment VSS   Pain Assessment   Pre/Posttreatment Pain Comment no c/o pain   Bed Mobility Assessment/Treatment   Bed Mobility, Assistive Device Head of Bed Elevated   Supine-Sit [place] minimum assist (75% patient effort);verbal cues required;nonverbal cues required (demo/gesture)   Sit to Supine, Independence moderate assist (50% patient effort);verbal cues required;nonverbal cues required (demo/gesture)   Safety Issues impaired trunk control for bed mobility   Impairments strength decreased;flexibility decreased;balance impaired;endurance   Transfer Assessment/Treatment   Sit-Stand Independence minimum assist (75% patient effort);verbal cues required   Stand-Sit [place] minimum assist (75% patient effort);verbal cues required   Sit-Stand-Sit, Assist Device walker, front wheeled   Bed-Chair Independence minimum assist (75% patient effort);verbal cues required   Bed-Chair-Bed Assist Device walker, front wheeled   Transfer Comment Stands EOB >1 min, completes heel raises x 5   Balance Skill Training   Comment FWW in standing   Sitting Balance: Static fair balance  Sitting, Dynamic (Balance) fair - balance   Sit-to-Stand Balance fair - balance   Standing Balance: Static fair - balance   Standing Balance: Dynamic fair - balance   Systems Impairment Contributing to Balance Disturbance musculoskeletal   Post Treatment Status   Post Treatment Patient Status  Patient supine in bed;Call light within reach;Telephone within reach   Support Present Post Treatment  Family present   Plan of [place] With patient;spouse   Basic Mobility Am-PAC/6Clicks Score (APPROVED PT Staff, [facility], [place], [place], [place], and F[facility])   Turning in bed without bedrails 3   Lying on back to sitting on edge of flat bed 3   Moving to and from a bed to a chair 3   Standing up from chair 3   Walk in room 2   Climbing 3-5 steps with railing 2   6 Clicks Raw Score total 16   Standardized (t-scale) score 38.32   CMS 0-100% Score 47.12   CMS Modifier CK   Patient Mobility Goal (JHHLM) 5- Stand 1 minute or more 3X/day   Exercise/Activity Level Performed 5- Static standing>1 minute   Physical Therapy Clinical Impression   Assessment PT treatment complete, pt tolerating fairly well with good motiviation and mood. Demonstrates bed mobility to EOB Min A, Stands to EOB >1 min to FWW Min A and completes heel raises holding to FWW with c/o fatigue limiting after 5 reps. Patient returns to supine Mod A. Pt repositioned for comfort. OOB deferred until after imminent CT scan. Anticipate D/C to inpatient rehab when medically appropriate.   Anticipated Equipment Needs at Discharge (PT) TBD   Anticipated Discharge Disposition inpatient rehabilitation facility         Therapist:   Theodosia Blender, PT   Pager #: [phone]

## 2021-04-21 ENCOUNTER — Encounter (INDEPENDENT_AMBULATORY_CARE_PROVIDER_SITE_OTHER): Payer: Self-pay | Admitting: SURGICAL ONCOLOGY

## 2021-04-21 LAB — CBC
HCT: 22 % — ABNORMAL LOW (ref 38.9–52.0)
HGB: 7.1 g/dL — ABNORMAL LOW (ref 13.4–17.5)
MCH: 29.8 pg (ref 26.0–32.0)
MCHC: 32.3 g/dL (ref 31.0–35.5)
MCV: 92.4 fL (ref 78.0–100.0)
MPV: 10.4 fL (ref 8.7–12.5)
PLATELETS: 548 10*3/uL — ABNORMAL HIGH (ref 150–400)
RBC: 2.38 10*6/uL — ABNORMAL LOW (ref 4.50–6.10)
RDW-CV: 17 % — ABNORMAL HIGH (ref 11.5–15.5)
WBC: 13.4 10*3/uL — ABNORMAL HIGH (ref 3.7–11.0)

## 2021-04-21 LAB — PTT (PARTIAL THROMBOPLASTIN TIME)
APTT: 84.1 seconds — ABNORMAL HIGH (ref 24.2–37.5)
APTT: 34.7 seconds (ref 24.2–37.5)

## 2021-04-21 LAB — POC BLOOD GLUCOSE (RESULTS)
GLUCOSE, POC: 176 mg/dl — ABNORMAL HIGH (ref 70–105)
GLUCOSE, POC: 164 mg/dl — ABNORMAL HIGH (ref 70–105)
GLUCOSE, POC: 187 mg/dl — ABNORMAL HIGH (ref 70–105)
GLUCOSE, POC: 166 mg/dl — ABNORMAL HIGH (ref 70–105)

## 2021-04-21 LAB — BASIC METABOLIC PANEL
ANION GAP: 4 mmol/L (ref 4–13)
BUN/CREA RATIO: 22 (ref 6–22)
BUN: 13 mg/dL (ref 8–25)
CALCIUM: 7.8 mg/dL — ABNORMAL LOW (ref 8.8–10.2)
CHLORIDE: 99 mmol/L (ref 96–111)
CO2 TOTAL: 26 mmol/L (ref 23–31)
CREATININE: 0.59 mg/dL — ABNORMAL LOW (ref 0.75–1.35)
ESTIMATED GFR: >90 mL/min/BSA (ref >=60)
GLUCOSE: 171 mg/dL — ABNORMAL HIGH (ref 65–125)
POTASSIUM: 3.9 mmol/L (ref 3.5–5.1)
SODIUM: 129 mmol/L — ABNORMAL LOW (ref 136–145)

## 2021-04-21 LAB — MAGNESIUM: MAGNESIUM: 2 mg/dL (ref 1.8–2.6)

## 2021-04-21 LAB — PHOSPHORUS: PHOSPHORUS: 2.2 mg/dL — ABNORMAL LOW (ref 2.3–4.0)

## 2021-04-21 LAB — TRIGLYCERIDE BODY FLUID: TRIGLYCERIDES BODY FLUID: 172 mg/dL

## 2021-04-21 MED ORDER — ELECTROLYTE-A INTRAVENOUS SOLUTION
INTRAVENOUS | Status: DC
Start: 2021-04-21 — End: 2021-04-22
  Administered 2021-04-22: 0 mL via INTRAVENOUS

## 2021-04-21 MED ORDER — SODIUM PHOSPHATE 3 MMOL/ML INTRAVENOUS SOLUTION
30.0000 mmol | Freq: Once | INTRAVENOUS | Status: AC
Start: 2021-04-21 — End: 2021-04-21
  Administered 2021-04-21: 0 mmol via INTRAVENOUS
  Administered 2021-04-21: 30 mmol via INTRAVENOUS
  Filled 2021-04-21: qty 10

## 2021-04-21 MED ORDER — WATER FOR INJECTION, STERILE INTRAVENOUS SOLUTION
INTRAVENOUS | Status: AC
Start: 2021-04-21 — End: 2021-04-22
  Filled 2021-04-21: qty 1100

## 2021-04-21 NOTE — Progress Notes (Signed)
This patient was discussed at Multidisciplinary Liver and Pancreas Diseases Conference on 04/21/2021      The conference members include Abdominal Radiology, Advanced Therapeutic Endoscopy, Medical Oncology, Surgical Oncology, General Surgery, Pathology, Interventional Radiology and [facility].     The recommendation includes:  Advanced GI to look at doing a PD Stent    For any questions, please contact the primary consulting service-   [facility]

## 2021-04-21 NOTE — Nurses Notes (Signed)
Heparin gtt unheld, clarified with Surg Onc to restart heparin at ordered 12u/kg/h and follow protocol. Also requested diet order since per notes, IR not planning intervention today.

## 2021-04-21 NOTE — Consults (Signed)
INTERVENTIONAL RADIOLOGY  INITIAL BRIEF CONSULT NOTE      Patient Name: [name]  Patient MRN: [id number]  Patient DOB: Jan 20, 1946  Date of Service:04/21/2021  Admission Dx: Intra abdominal hemorrhage    Consult Requested By: SURG ONCOLOGY    History of Present Illness  The patient is 76 y.o. male with LUQ fluid collection which has a JP drain that is not draining. Consult made for IR drain placement.     Pertinent Imaging reveals: JP drain enters abdominal cavity via RUQ appraoch, tip is within LUQ fluid collection.     VITALS            BP 137/79   Pulse 92   Temp 36.8 C (98.3 F)   Resp 14   Ht 1.753 m (_0 )   Wt 84.4 kg (186 lb 1.1 oz)   SpO2 92%   BMI 27.48 kg/m         LABS    CBC Differential   Recent Labs     04/19/21  0420 04/20/21  0418 04/21/21  0142   WBC 14.8* 16.0* 13.4*   HGB 7.5* 7.3* 7.1*   HCT 23.7* 23.0* 22.0*   PLTCNT 627* 616* 548*    No results for input(s): PMNS, LYMPHOCYTES, MONOCYTES, EOSINOPHIL, BASOPHILS, NRBCS, PMNABS, LYMPHSABS, MONOSABS, EOSABS, BASOSABS in the last 72 hours.   BMP LFTs   Recent Labs     04/21/21  0142   SODIUM 129*   POTASSIUM 3.9   CHLORIDE 99   CO2 26   BUN 13   CREATININE 0.59*   GLUCOSENF 171*   ANIONGAP 4   BUNCRRATIO 22   GFR >90   CALCIUM 7.8*   MAGNESIUM 2.0   PHOSPHORUS 2.2*    No results found for this encounter   CoAgs Cardiac Markers   Recent Labs     04/21/21  0142   APTT 84.1*    No results for input(s): TROPONINI, CKMB, MBINDEX, BNP in the last 72 hours.   Urine Analysis Other Labs   No results found for this encounter No results found for this encounter    Invalid input(s): PRL     PMH  Past Medical History:   Diagnosis Date    Cancer (CMS HCC)     prostate    Deep vein thrombosis (DVT) (CMS HCC)     right leg    Esophageal reflux     H/O hearing loss     High cholesterol     History of kidney disease     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    HTN (hypertension)     Human immunodeficiency virus (HIV) disease (CMS HCC)      Hyperlipidemia     "borderline"    Hypothyroidism     MRSA (methicillin resistant staph aureus) culture positive 01/02/2021    MRSA left groin abscess 01/03/21    MRSA (methicillin resistant staph aureus) culture positive 01/02/2021    MRSA blood 01/02/21    Pulmonary embolism (CMS [place]) 03/31/2021    Thyroid disorder     Wears glasses          PSH     Past Surgical History:   Procedure Laterality Date    COLON SURGERY      per patient had part of colon removed and had a colostomy    COLONOSCOPY      GASTROSCOPY      HIP SURGERY Right 04/22/2020  Right hip arthrotomy 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 Darryl Nestle CERVICAL SPINE SURGERY  2001    HX COLOSTOMY REVERSAL      HX HERNIA REPAIR      KNEE SURGERY Bilateral     WRIST SURGERY Right              FAMILY HISTORY:    His family history includes Cancer in his father, mother, and another family member; Heart Attack in his mother; High Cholesterol in an other family member; Stroke in an other family member.     SOCIAL HISTORY:    He  reports that he does not currently use alcohol. He  reports that he has never smoked. He has never used smokeless tobacco. He  reports no history of drug use.     OUTPATIENT MEDICATIONS  Medications Prior to Admission       Prescriptions    acetaminophen (TYLENOL) 500 mg Oral Tablet    Take 2 Tablets (1,000 mg total) by mouth Every 4 hours as needed for Pain    amLODIPine (NORVASC) 5 mg Oral Tablet    Take 1 Tablet (5 mg total) by mouth Once a day    apixaban (ELIQUIS) 5 mg Oral Tablet    Take 2 Tablets (10 mg total) by mouth Twice daily for 14 doses    apixaban (ELIQUIS) 5 mg Oral Tablet    Take 1 Tablet (5 mg total) by mouth Twice daily for 180 days    atorvastatin (LIPITOR) 80 mg Oral Tablet    Take 0.5 Tablets (40 mg total) by mouth Every morning with breakfast    BIOTIN ORAL    Take 1 Tablet by mouth Once a day    Blood Sugar Diagnostic (ACCU-CHEK GUIDE TEST STRIPS) Strip    1 Strip Four times a day - before  meals and bedtime    cetirizine (ZYRTEC) 10 mg Oral Tablet    Take 1 Tablet (10 mg total) by mouth Once a day    cyanocobalamin (VITAMIN B 12) 1,000 mcg Oral Tablet    Take 1 Tablet (1,000 mcg total) by mouth Every morning    elviteg-cob-emtri-tenof ALAFEN (GENVOYA) 150-150-200-10 mg Oral Tablet    Take 1 Tablet by mouth Once a day    flash glucose scanning reader (FREESTYLE LIBRE 2 READER) Does not apply Misc    Use as directed with FreeStyle Libre 2 sensors    flash glucose sensor (FREESTYLE LIBRE 2 SENSOR) Does not apply Kit    To check blood glucose continuously. Change every 14 days    gabapentin (NEURONTIN) 300 mg Oral Capsule    Take 1 Capsule (300 mg total) by mouth Three times a day    insulin aspart U-100 (NOVOLOG) 100 unit/mL (3 mL) Subcutaneous Insulin Pen    Inject 4 Units under the skin Three times a day With meals    insulin glargine (LANTUS SOLOSTAR U-100 INSULIN) 100 unit/mL Subcutaneous Insulin Pen    Inject 15 units under the skin daily    insulin syr/ndl U100 half mark 0.3 mL 31 gauge x 5/16" Syringe    Use a new syringe for each injection.    lancets (FREESTYLE LANCETS) 28 gauge Misc    Use to test blood sugar 4 times a day - before meals and bedtime    levothyroxine (SYNTHROID) 88 mcg Oral Tablet    Take 1 Tablet (88 mcg total) by mouth Every morning    melatonin 5 mg  Oral Tablet    Take 1 Tablet (5 mg total) by mouth Every night    MULTIVITAMIN ORAL    Take 1 Tablet by mouth Once a day    pantoprazole (PROTONIX) 40 mg Oral Tablet, Delayed Release (E.C.)    Take 1 Tablet (40 mg total) by mouth Twice daily 30 minutes before a meal    Pen Needle, Disposable, (BD NANO 2ND GEN PEN NEEDLE) 32 gauge x 5/32" Needle    Use a new pen needle for each injection.    POTASSIUM ORAL    Take 1 Tablet by mouth Once a day          INPATIENT MEDICATIONS  acetaminophen (TYLENOL) tablet, 650 mg, Oral, Q4H  adult custom parenteral nutrition, , Intravenous, Continuous  atorvastatin (LIPITOR) tablet, 40 mg, Oral,  Daily with Breakfast  benzonatate (TESSALON) capsule, 100 mg, Oral, Q8H PRN  D5W 250 mL flush bag, , Intravenous, Q15 Min PRN  DAPTOmycin (CUBICIN) 800 mg in NS 50 mL IVPB, 10 mg/kg (Adjusted), Intravenous, Q24H  diatrizoate meglumine & sodium oral solution, 8 mL, Oral, Give in Radiology  docusate sodium (COLACE) capsule, 100 mg, Oral, 2x/day  electrolyte-A (PLASMALYTE-A) premix infusion, , Intravenous, Continuous  elvitegravir-cobicistat-emtricitrabine-tenofovir (GENVOYA) 150-150-200-10 mg per tablet, 1 Tablet, Oral, Daily  gabapentin (NEURONTIN) capsule, 300 mg, Oral, 3x/day  [Held by provider] heparin 25,000 units in NS 250 mL infusion, 12 Units/kg/hr (Adjusted), Intravenous, Continuous  HYDROmorphone (DILAUDID) 1 mg/mL injection, 0.2 mg, Intravenous, Q3H PRN  meropenem (MERREM) 1 g in NS 100 mL IVPB, 1 g, Intravenous, Q8H  methocarbamol (ROBAXIN) tablet, 500 mg, Oral, 4x/day  multivitamin-minerals-folic acid-lycopene-lutein (CERTAVITE SENIOR) tablet, 1 Tablet, Oral, Q24H  NS 250 mL flush bag, , Intravenous, Q15 Min PRN  NS flush syringe, 2-6 mL, Intracatheter, Q8HRS  NS flush syringe, 2-6 mL, Intracatheter, Q1 MIN PRN  NS flush syringe, 10-30 mL, Intracatheter, Q8HRS  NS flush syringe, 20-30 mL, Intracatheter, Q1 MIN PRN  NS flush syringe, 10-30 mL, Intracatheter, Q8HRS  NS flush syringe, 20-30 mL, Intracatheter, Q1 MIN PRN  ondansetron (ZOFRAN) 2 mg/mL injection, 4 mg, Intravenous, Q6H PRN  oxyCODONE (ROXICODONE) immediate release tablet, 5 mg, Oral, Q4H PRN  oxyCODONE (ROXICODONE) immediate releaste tablet, 10 mg, Oral, Q4H PRN  pantoprazole (PROTONIX) delayed release tablet, 40 mg, Oral, Q12H  sennosides-docusate sodium (SENOKOT-S) 8.6-50mg per tablet, 1 Tablet, Oral, 2x/day  SSIP insulin R human (HUMULIN R) 100 units/mL injection, 0-12 Units, Subcutaneous, 4x/day PRN        MEDICATION ALLERGIES  No Known Allergies          ASSESSMENT & PLAN  76 y.o. male with LUQ fluid collection, JP drain is in place but  not draining.     Will hold off on IR drain placement at this time after discussion with surgical oncology team. Drain would likely be placed via a transpleural approach, and wire exchange of the existing surgical drain is not recommended. With placement of a new catheter in a transpleural route, there is risk of causing lung and pleural injury as well as seeding the pleura.     Discussed with IR attending and approved    Contact IR resident with patient status updates: Pager [phone]  Contact IR charge nurse for questions regarding scheduling: Phone [phone]    Timoteo Expose, MD  04/21/2021, 11:03

## 2021-04-21 NOTE — Consults (Signed)
Gastroenterology and Hepatology   Consult Note      Name: [name], [name], 76 y.o. male  Date of Admission:  04/03/2021  Date of Service: 04/21/2021  Date of Birth:  [birth year]  Requesting MD: Dr. Cyndi Bender    Assessment/Plan:  [name] is a 76 y.o. male with history of HIV infection, hypertension, hyperlipidemia. 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 He underwent ex lap on 04/03/21 at an outside facility with ligation of a bleeding vessel in the liver bed. IR placed RUQ drain and performed empiric embolization on 3/10 for peripancreatic fluid collections. On 3/11, patient developed a midline fistula draining stool. Imaging on 3/21 shows improvement in fluid collections but persistent fluid around splenectomy bed with surgical drain coursing through it. [facility] is consulted for endoscopic evaluation.     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  - Please keep NPO past midnight   - Please stop heparin gtt at 4 am if safe from a primary standpoint  - Monitor LFT's, CBC and BMP  - Keep INR < 1.5, platelets > 50,000 and Hgb > 8  - I explained to the patient in detail about the ERCP. Explained the risks of complications including bleeding/perforation/infection/cardiorespiratory distress and 3-5% chance of post ERCP pancreatitis. Patient understood and agreed for ERCP after explaining benefits and risks for the  procedure. All questions were answered.    [facility] will follow. Please call with questions or concerns.     HPI: [name] is a 76 y.o. male with history of HIV infection, hypertension, hyperlipidemia. 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 He underwent massive transfusion protocol and ex lap on 04/03/21 at an outside facility with ligation of a bleeding vessel in the liver bed. Patient was transferred to [facility] postoperatively. 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 CT scan of the chest, abdomen, and pelvis obtained on 04/08/21 showed extensive multiloculated RUQ fluid collection and additional complex fluid collections in the pancreatectomy bed and splenectomy bed, but no active bleeding. On 04/09/21, he underwent IR-guided RUQ peritoneal drain placement and empiric embolization of the splenic artery stump and left inferior adrenal artery. Imaging performed yesterday shows improvement in fluid collections but persistent fluid around splenectomy bed with surgical drain coursing through it. Patient reports doing ok this afternoon. He denies any fevers, chills, nausea, vomiting, hematochezia or melena. He reports mild soreness in RUQ near his JP drain. He states his JP drain has not had much output today.     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)     Hyperlipidemia      "borderline"    Hypothyroidism     MRSA (methicillin resistant staph aureus) culture positive 01/02/2021    MRSA left groin abscess 01/03/21    MRSA (methicillin resistant staph aureus) culture positive 01/02/2021    MRSA blood 01/02/21    Pulmonary embolism (CMS [place]) 03/31/2021    Thyroid disorder     Wears glasses      PSH:  Past Surgical History:   Procedure Laterality Date    COLON SURGERY      per patient had 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 Darryl Nestle CERVICAL SPINE SURGERY  2001    HX COLOSTOMY REVERSAL      HX HERNIA REPAIR      KNEE SURGERY Bilateral     WRIST SURGERY Right      FAMHX:  Family Medical History:       Problem Relation (Age of Onset)    Cancer Mother, Father, Other    Heart Attack Mother    High Cholesterol Other    Stroke Other          Objective:    Review of Systems  Other than ROS in the HPI, all other systems were negative.    Physical Exam  Filed Vitals:    04/21/21 0420 04/21/21 0708 04/21/21 0715 04/21/21 1100   BP:   137/79    Pulse:   92    Resp: 18  14    Temp:  36.8 C (98.3 F)  37.1 C (98.8 F)   SpO2:   92%      General: No apparent distress. Appears chronically ill.   Eyes:  Pupils equal and round. Sclera non-icteric   Mouth: Mucous membranes moist  Neck:  Supple, trachea midline  Lungs:  Unlabored effort, equal bilateral chest rise  Heart:  RRR  Abdomen: Soft. TTP over RUQ. JP drain with minimal output. Ostomy draining brown stool.  Extremities: No cyanosis. Moves all extremities.   Neuro:  Grossly normal  Skin:  Warm and dry    Labs:   Latest Reference Range & Units 04/20/21 04:18   TOTAL PROTEIN 6.0 - 8.0 g/dL 5.1 (L)   ALBUMIN 3.4 - 4.8 g/dL  1.4 (L)   BILIRUBIN, TOTAL 0.3 - 1.3 mg/dL 0.2 (L)   BILIRUBIN,CONJUGATED 0.1 - 0.4 mg/dL 0.1   AST (SGOT) 8 - 45 U/L 44   ALT (SGPT) 10 - 55 U/L 57 (H)   ALKALINE PHOSPHATASE 45 -  115 U/L 128 (H)       Imaging:  CT (04/20/21):  IMPRESSION:     1. 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     2. Interval placement of a percutaneous drain into the complex fluid collection in the right upper quadrant with overall decreased size and extent  of the multiloculated collection in the right upper quadrant as compared to April 09, 2021. No new or worsening intra-abdominal fluid collections are detected.     3. Unchanged subocclusive right segmental and subsegmental pulmonary emboli as compared to April 09, 2021. No large central pulmonary embolus is present.     4. Trace residual bilateral pleural effusions with bibasilar atelectasis, improved from April 09, 2021.         Rueben Bash, MD  Gastroenterology and Hepatology Fellow      Patient seen and examined with the GI fellow. I was present for key portions of the H&P, and I participated in the plan of care. I agree with their findings and recommendations. Recommend ERCP with stent placement.     Jameya Pontiff J. Theodora Blow, MD FASGE   Professor of Medicine  Director of Advanced Therapeutic Endoscopy  [facility] Medicine

## 2021-04-21 NOTE — Progress Notes (Signed)
[name]  Surgical Oncology  Progress Note      [name], [name], 76 y.o. male  Date of Birth:  1945-07-08  Date of Admission:  04/03/2021  Date of service: 04/21/2021    Assessment:  This is a 76 y.o. 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 He underwent massive transfusion protocol and ex lap on 04/03/21 at an outside facility with ligation of a bleeding vessel in the liver bed. Patient was subsequently transferred to [facility] postoperaively, intubated. Patient was subsequently extubated later that day on 3/4. 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      Plan/Recommendations:  - Hematochezia resolved  - Diet: NPO, TPN  - Fever, tachycardia   - Blood cultures 3/20 pending   - CT C/A/P with PO contrast, CTA chest   - UA with few yeast  - Peripancreatic fluid collections              - IR consulted; RUQ drain placement and empiric embolization 3/10              - Blood cultures 3/9: + pseudomonas    - Cultures form R JP: Pseudomonas and enterococcus              - Repeat Blood cultures 3/12 negative   - ID consulted; continue Daptomycin and meropenum (stop date 4/7)   - RUQ JP with no output will consult IR for possible IR drain replacement  - PE   - s/p IVC filter placement 3/17   - Aggressive pulm toilet, supp off O2   - Holding heparin gtt for possible IR  - Labs: reviewed, stable  - IVF: discontinued  - 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 Reports feeling well this morning and is tolerating a diabetic diet without N/V.    Objective  Filed Vitals:    04/21/21 0310 04/21/21 0420 04/21/21 0708 04/21/21 0715   BP: 136/75   137/79   Pulse: 88   92   Resp: '16 18  14   '$ Temp:   36.8 C (98.3 F)    SpO2: 93%   92%     Physical Exam:    GEN:  AOx4, resting in bed, no acute distress  PULM: Normal respiratory effort.   CV:  Tachycardic, regular rhythm   ABD:   Abdomen soft, minimal ttp RUQ, nondistended. JP in place in RUQ with minimal output,  Lower midline incision with feculent drainage present with ostomy bag over it.  Pigtail catheter present with SS output.  MS: Atraumatic. Moves all extremities.  NEURO:   Alert and oriented to person, place and time.  Cranial nerves grossly intact.    Integumentary:  Pink, warm, and dry  PSYCHOSOCIAL: Pleasant.  Normal affect.     Labs  Results for orders placed or performed during the hospital encounter of 04/03/21 (from the past 24 hour(s))   PTT (PARTIAL THROMBOPLASTIN TIME) - ONCE   Result Value Ref Range    APTT 35.5 24.2 - 37.5 seconds    Narrative    Therapeutic range for unfractionated heparin is 60-100 seconds.   PTT (PARTIAL THROMBOPLASTIN TIME)   Result Value Ref Range    APTT 84.1 (  H) 24.2 - 37.5 seconds    Narrative    Therapeutic range for unfractionated heparin is 60-100 seconds.   PHOSPHORUS   Result Value Ref Range    PHOSPHORUS 2.2 (L) 2.3 - 4.0 mg/dL   CBC   Result Value Ref Range    WBC 13.4 (H) 3.7 - 11.0 x10^3/uL    RBC 2.38 (L) 4.50 - 6.10 x10^6/uL    HGB 7.1 (L) 13.4 - 17.5 g/dL    HCT 22.0 (L) 38.9 - 52.0 %    MCV 92.4 78.0 - 100.0 fL    MCH 29.8 26.0 - 32.0 pg    MCHC 32.3 31.0 - 35.5 g/dL    RDW-CV 17.0 (H) 11.5 - 15.5 %    PLATELETS 548 (H) 150 - 400 x10^3/uL    MPV 10.4 8.7 - 12.5 fL   MAGNESIUM   Result Value Ref Range    MAGNESIUM 2.0 1.8 - 2.6 mg/dL   BASIC METABOLIC PANEL   Result Value Ref Range    SODIUM 129 (L) 136 - 145 mmol/L    POTASSIUM 3.9 3.5 - 5.1 mmol/L    CHLORIDE 99 96 - 111 mmol/L    CO2 TOTAL 26 23 - 31 mmol/L    ANION GAP 4 4 - 13 mmol/L    CALCIUM 7.8 (L) 8.8 - 10.2 mg/dL    GLUCOSE 171 (H) 65 - 125 mg/dL    BUN 13 8 - 25 mg/dL    CREATININE 0.59 (L) 0.75 - 1.35 mg/dL    BUN/CREA RATIO 22 6 - 22    ESTIMATED GFR >90 >=60 mL/min/BSA   POC BLOOD GLUCOSE  (RESULTS)   Result Value Ref Range    GLUCOSE, POC 203 (H) 70 - 105 mg/dl   POC BLOOD GLUCOSE (RESULTS)   Result Value Ref Range    GLUCOSE, POC 218 (H) 70 - 105 mg/dl   POC BLOOD GLUCOSE (RESULTS)   Result Value Ref Range    GLUCOSE, POC 176 (H) 70 - 105 mg/dl       I/O:  Date 04/20/21 0700 - 04/21/21 0659 04/21/21 0700 - 04/22/21 0659   Shift 0700-1459 1500-2259 2300-0659 24 Hour Total 0700-1459 1500-2259 2300-0659 24 Hour Total   INTAKE   I.V.(mL/kg/hr) 69.4(0.1) 986.05(1.46) 438.75(0.65) 1494.2(0.74)         Heparin Volume 12.4 179.5 99.45 291.35         Volume Infused (adult custom parenteral nutrition) 57 806.55  863.55         Volume Infused (adult custom parenteral nutrition)   339.3 339.3       IV Piggyback  174  174         Volume (DAPTOmycin (CUBICIN) 800 mg in NS 50 mL IVPB)  66  66         Volume (sodium phosphate 20 mmol in D5W 100 mL IVPB)  108  108       Shift Total(mL/kg) 69.4(0.82) 1160.05(13.74) 438.75(5.2) 1668.2(19.77)       OUTPUT   Urine(mL/kg/hr) 150(0.22) 475(0.7) 750(1.11) 1375(0.68) 375   375     Urine (Voided) 150 [phone] 375   375   Emesis     1   1     Emesis     1   1     Emesis Occurrence     1 x   1 x   Drains 590 340 20 950 130   130     JP Drain Output [name] Drain Right;Upper Abdomen) 20 0 0 '20 10   10     '$ Drain Output (Drain (Miscellaneous) #41F pigtail catheter for RLQ fluid collection Lower;Right;Anterior Abdomen) 570 240 20 830 120   120     Drain Output (Drain (Miscellaneous) peds ostomy bag Lower;Medial Abdomen) 0 100  100       Other             Other     0 x   0 x   Shift Total(mL/kg) 740(8.77) 815(9.66) 770(9.12) 4098(11.91) 506(6)   506(6)   Weight (kg) 84.4 84.4 84.4 84.4 84.4 84.4 84.4 84.4       Radiology  Results for orders placed or performed during the hospital encounter of 04/03/21 (from the past 72 hour(s))   CT CHEST ABDOMEN PELVIS W IV CONTRAST     Status: None    Narrative    [name]  Male, 76 years old.    CT CHEST ABDOMEN PELVIS W IV  CONTRAST performed on 04/20/2021 12:33 PM.    REASON FOR EXAM:  assess peripancreatic fluid collections, PE, and location of ECF    ADDITIONAL CLINICAL INFORMATION: 76 year old male with a known history of recent distal pancreatectomy and splenectomy with known pulmonary emboli as well as a midline fistula draining stool.    TECHNIQUE: Axial CT images were obtained through the chest, abdomen, and pelvis following the intravenous and oral administration of contrast. The chest portion of the exam was performed with a pulmonary embolus protocol. Coronal and sagittal reformatted images were created.    RADIATION DOSE: 547.63 mGy.cm  CONTRAST: 91 mL of Isovue 370    COMPARISON: CT angiogram of the abdomen and pelvis performed April 09, 2021.    FINDINGS:     CHEST:    LUNGS: Atelectasis is again noted within the lung bases bilaterally, right greater than left. The large central airways are patent.    HEART/MEDIASTINUM: The heart is normal in size without pericardial effusion. The thoracic aorta is within normal limits in caliber and contour. Within the right lower lobe, there are a few small subsegmental distal pulmonary emboli. There are also subocclusive pulmonary emboli seen near the division of the right main pulmonary artery as well as extending into segmental right upper lobe pulmonary arteries. These appear similar to April 09, 2021. No large central or occlusive pulmonary embolus is detected within the confines of the exam.    PLEURA: Trace bilateral pleural effusions are present, improved from April 09, 2021.    LYMPH NODES: No abnormally enlarged lymph nodes are detected in the chest.    BONES/SOFT TISSUES: A right PICC is in place with the catheter tip in the lower SVC. No acute or destructive bony abnormalities are visible in the chest.      ABDOMEN/PELVIS:     LIVER: Small low-density masses in the peripheral dome and left lobe are most compatible with small cysts, unchanged. There are lobular contour changes  of the liver due to the complex perihepatic fluid collection described below.    BILIARY SYSTEM/GALLBLADDER: The gallbladder is within normal limits in size. No intra or extrahepatic bile duct dilation is present.    PANCREAS: Postoperative changes from recent distal pancreatectomy are again demonstrated. A fluid collection with thin rim enhancement at the surgical bed, series 9 image 37, measures 5.7 x 3.3 cm as compared to prior measurements of 7.3 x 5.8 cm.    KIDNEY/URETERS: The kidneys are normal in size with symmetric parenchymal  enhancement. Subcentimeter low-density renal masses bilaterally are too small to definitively characterize but likely represent small cysts. No hydronephrosis is present. The ureters are of normal caliber.    ADRENALS: Within normal limits.    SPLEEN: Surgically absent. There is a moderate amount of partially loculated fluid in the splenectomy bed which is similar to the prior CT. A surgical drain remains in place and extends into the splenectomy bed.    BOWEL: The gastric lumen is decompressed. Enteric contrast can be seen within multiple large and small bowel loops to the level of the transverse colon. One of these mid transverse colonic loops is in close approximation to extraluminal contrast which extends into the midline of the abdominal wall at the incision site, series 9 image 52, although no distinct fistulous tract is visible.    VASCULATURE/LYMPH NODES: The abdominal aorta is normal in caliber with mild scattered atherosclerotic calcifications. The portal, splenic, superior mesenteric, and hepatic veins are patent. An IVC filter is in place. Metallic embolization coils in the upper abdomen corresponding to the recent empiric embolization procedure.    BLADDER: Mildly distended without visible wall thickening.    REPRODUCTIVE ORGANS: The prostate is normal in size.    PERITONEAL CAVITY: Since the previous exam, there has been interval placement of a percutaneous pigtail  drainage catheter into the multiloculated complex fluid collection seen along the anterior margin of the liver. This fluid collection has significantly decreased in size and extent. Additionally, the component of fluid collection extending inferiorly from the liver in the right upper quadrant, series 9 image 34, measures 11.7 x 10.3 cm which is improved from prior measurements of 8.1 x 6.0 cm. The small loculated fluid collection adjacent to the posterior wall of the distal stomach, series 9 image 43, has also significantly decreased in size. No new or worsening fluid collections are detected    BONES/SOFT TISSUES: As noted above, there is complex fluid containing dense contrast material at the midline abdominal wall incision site with some scattered locules of air within the collection and adjacent subcutaneous soft tissues. The extraluminal dense contrast is most closely approximated to the mid transverse colon which also contains dense intraluminal contrast. 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 Asymmetric stranding in the right groin is likely related to recent vascular access at this site with some unorganized blood products.      Impression    1. 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    2. Interval placement of a percutaneous drain into the complex fluid collection in the right upper quadrant with overall decreased size and extent of the multiloculated collection in the right upper quadrant as compared to April 09, 2021. No new or worsening intra-abdominal fluid collections are detected.    3. Unchanged subocclusive right segmental and subsegmental pulmonary emboli as compared to April 09, 2021. No large central pulmonary embolus is present.    4. Trace residual bilateral  pleural effusions with bibasilar atelectasis, improved from April 09, 2021.         Current Medications:  Current Facility-Administered Medications   Medication Dose Route Frequency    acetaminophen (TYLENOL) tablet  650 mg Oral Q4H    adult custom parenteral nutrition   Intravenous Continuous    atorvastatin (LIPITOR) tablet  40 mg Oral Daily with Breakfast  benzonatate (TESSALON) capsule  100 mg Oral Q8H PRN    D5W 250 mL flush bag   Intravenous Q15 Min PRN    DAPTOmycin (CUBICIN) 800 mg in NS 50 mL IVPB  10 mg/kg (Adjusted) Intravenous Q24H    diatrizoate meglumine & sodium oral solution  8 mL Oral Give in Radiology    docusate sodium (COLACE) capsule  100 mg Oral 2x/day    electrolyte-A (PLASMALYTE-A) premix infusion   Intravenous Continuous    elvitegravir-cobicistat-emtricitrabine-tenofovir (GENVOYA) 150-150-200-10 mg per tablet  1 Tablet Oral Daily    gabapentin (NEURONTIN) capsule  300 mg Oral 3x/day    [Held by provider] heparin 25,000 units in NS 250 mL infusion  12 Units/kg/hr (Adjusted) Intravenous Continuous    HYDROmorphone (DILAUDID) 1 mg/mL injection  0.2 mg Intravenous Q3H PRN    meropenem (MERREM) 1 g in NS 100 mL IVPB  1 g Intravenous Q8H    methocarbamol (ROBAXIN) tablet  500 mg Oral 4x/day    multivitamin-minerals-folic acid-lycopene-lutein (CERTAVITE SENIOR) tablet  1 Tablet Oral Q24H    NS 250 mL flush bag   Intravenous Q15 Min PRN    NS flush syringe  2-6 mL Intracatheter Q8HRS    NS flush syringe  2-6 mL Intracatheter Q1 MIN PRN    NS flush syringe  10-30 mL Intracatheter Q8HRS    NS flush syringe  20-30 mL Intracatheter Q1 MIN PRN    NS flush syringe  10-30 mL Intracatheter Q8HRS    NS flush syringe  20-30 mL Intracatheter Q1 MIN PRN    ondansetron (ZOFRAN) 2 mg/mL injection  4 mg Intravenous Q6H PRN    oxyCODONE (ROXICODONE) immediate release tablet  5 mg Oral Q4H PRN    oxyCODONE (ROXICODONE) immediate releaste tablet  10 mg Oral Q4H PRN    pantoprazole (PROTONIX) delayed release tablet   40 mg Oral Q12H    sennosides-docusate sodium (SENOKOT-S) 8.6-'50mg'$  per tablet  1 Tablet Oral 2x/day    SSIP insulin R human (HUMULIN R) 100 units/mL injection  0-12 Units Subcutaneous 4x/day PRN       Allene Pyo, PA-C  04/21/2021, 10:00    I personally saw and examined the patient. See physician's assistant note for additional details.     Reviewed scan in multi-D clinic and discussed with GI and IR.  Drain unable to be placed in splenectomy bed collection without going transpleural.  Given improving leukocytosis and afebrile, will hold at this time.  Discussed pancreatic duct stent with GI given high output from existing IR drain.  Also discussed future endoscopic closure of colonic fistula, but recommended deferring this until he is improving otherwise.      Lynwood Dawley, MD

## 2021-04-22 ENCOUNTER — Inpatient Hospital Stay (HOSPITAL_COMMUNITY): Payer: 59 | Admitting: Anesthesiology

## 2021-04-22 ENCOUNTER — Inpatient Hospital Stay (HOSPITAL_COMMUNITY): Payer: 59

## 2021-04-22 ENCOUNTER — Encounter (HOSPITAL_COMMUNITY)
Admission: AD | Disposition: A | Discharge status: Rehabilitation Facility | Payer: Self-pay | Source: Other Acute Inpatient Hospital | Attending: SURGICAL ONCOLOGY

## 2021-04-22 ENCOUNTER — Encounter (HOSPITAL_COMMUNITY): Payer: Self-pay | Admitting: SURGICAL ONCOLOGY

## 2021-04-22 ENCOUNTER — Other Ambulatory Visit (INDEPENDENT_AMBULATORY_CARE_PROVIDER_SITE_OTHER): Payer: Self-pay

## 2021-04-22 ENCOUNTER — Other Ambulatory Visit: Payer: Self-pay

## 2021-04-22 DIAGNOSIS — K8689 Other specified diseases of pancreas: Secondary | ICD-10-CM

## 2021-04-22 DIAGNOSIS — Q453 Other congenital malformations of pancreas and pancreatic duct: Secondary | ICD-10-CM

## 2021-04-22 DIAGNOSIS — Z48815 Encounter for surgical aftercare following surgery on the digestive system: Secondary | ICD-10-CM

## 2021-04-22 DIAGNOSIS — R58 Hemorrhage, not elsewhere classified: Secondary | ICD-10-CM

## 2021-04-22 LAB — BASIC METABOLIC PANEL
ANION GAP: 7 mmol/L (ref 4–13)
BUN/CREA RATIO: 20 (ref 6–22)
BUN: 13 mg/dL (ref 8–25)
CALCIUM: 8.1 mg/dL — ABNORMAL LOW (ref 8.8–10.2)
CHLORIDE: 99 mmol/L (ref 96–111)
CO2 TOTAL: 25 mmol/L (ref 23–31)
CREATININE: 0.64 mg/dL — ABNORMAL LOW (ref 0.75–1.35)
ESTIMATED GFR: >90 mL/min/BSA (ref >=60)
GLUCOSE: 169 mg/dL — ABNORMAL HIGH (ref 65–125)
POTASSIUM: 4.1 mmol/L (ref 3.5–5.1)
SODIUM: 131 mmol/L — ABNORMAL LOW (ref 136–145)

## 2021-04-22 LAB — POC BLOOD GLUCOSE (RESULTS)
GLUCOSE, POC: 197 mg/dl — ABNORMAL HIGH (ref 70–105)
GLUCOSE, POC: 201 mg/dl — ABNORMAL HIGH (ref 70–105)
GLUCOSE, POC: 254 mg/dl — ABNORMAL HIGH (ref 70–105)
GLUCOSE, POC: 210 mg/dl — ABNORMAL HIGH (ref 70–105)
GLUCOSE, POC: 167 mg/dl — ABNORMAL HIGH (ref 70–105)

## 2021-04-22 LAB — CBC
HCT: 23.6 % — ABNORMAL LOW (ref 38.9–52.0)
HGB: 7.5 g/dL — ABNORMAL LOW (ref 13.4–17.5)
MCH: 29.5 pg (ref 26.0–32.0)
MCHC: 31.8 g/dL (ref 31.0–35.5)
MCV: 92.9 fL (ref 78.0–100.0)
MPV: 10.5 fL (ref 8.7–12.5)
PLATELETS: 513 10*3/uL — ABNORMAL HIGH (ref 150–400)
RBC: 2.54 10*6/uL — ABNORMAL LOW (ref 4.50–6.10)
RDW-CV: 17.4 % — ABNORMAL HIGH (ref 11.5–15.5)
WBC: 11.4 10*3/uL — ABNORMAL HIGH (ref 3.7–11.0)

## 2021-04-22 LAB — PHOSPHORUS: PHOSPHORUS: 1.9 mg/dL — ABNORMAL LOW (ref 2.3–4.0)

## 2021-04-22 LAB — PTT (PARTIAL THROMBOPLASTIN TIME): APTT: 41.7 seconds — ABNORMAL HIGH (ref 24.2–37.5)

## 2021-04-22 LAB — AMYLASE BODY FLUID: AMYLASE BODY FLUID: >6554 U/L

## 2021-04-22 LAB — MAGNESIUM: MAGNESIUM: 1.9 mg/dL (ref 1.8–2.6)

## 2021-04-22 SURGERY — ERCP
Anesthesia: General | Site: Mouth | Wound class: Clean Contaminated Wounds-The respiratory, GI, Genital, or urinary

## 2021-04-22 MED ORDER — PROPOFOL 10 MG/ML IV BOLUS
INJECTION | Freq: Once | INTRAVENOUS | Status: DC | PRN
Start: 2021-04-22 — End: 2021-04-22
  Administered 2021-04-22: 40 mg via INTRAVENOUS
  Administered 2021-04-22: 140 mg via INTRAVENOUS

## 2021-04-22 MED ORDER — WATER FOR INJECTION, STERILE INTRAVENOUS SOLUTION
INTRAVENOUS | Status: AC
Start: 2021-04-22 — End: 2021-04-23
  Filled 2021-04-22: qty 1100

## 2021-04-22 MED ORDER — DEXTROSE 5% IN WATER (D5W) FLUSH BAG - 250 ML
INTRAVENOUS | Status: AC | PRN
Start: 2021-04-22 — End: ?

## 2021-04-22 MED ORDER — LACTATED RINGERS INTRAVENOUS SOLUTION
INTRAVENOUS | Status: DC
Start: 2021-04-22 — End: 2021-04-22
  Administered 2021-04-22: 0 mL via INTRAVENOUS

## 2021-04-22 MED ORDER — PROCHLORPERAZINE EDISYLATE 10 MG/2 ML (5 MG/ML) INJECTION SOLUTION
5.0000 mg | Freq: Once | INTRAMUSCULAR | Status: DC | PRN
Start: 2021-04-22 — End: 2021-04-22

## 2021-04-22 MED ORDER — PHENYLEPHRINE 1 MG/10 ML (100 MCG/ML) IN 0.9 % SOD.CHLORIDE IV SYRINGE
INJECTION | Freq: Once | INTRAVENOUS | Status: DC | PRN
Start: 2021-04-22 — End: 2021-04-22
  Administered 2021-04-22 (×2): 100 ug via INTRAVENOUS

## 2021-04-22 MED ORDER — FENTANYL (PF) 50 MCG/ML INJECTION SOLUTION
25.0000 ug | INTRAMUSCULAR | Status: DC | PRN
Start: 2021-04-22 — End: 2021-04-22

## 2021-04-22 MED ORDER — FENTANYL (PF) 50 MCG/ML INJECTION SOLUTION
12.5000 ug | INTRAMUSCULAR | Status: DC | PRN
Start: 2021-04-22 — End: 2021-04-22

## 2021-04-22 MED ORDER — SODIUM CHLORIDE 0.9% FLUSH BAG - 250 ML
INTRAVENOUS | Status: DC | PRN
Start: 2021-04-22 — End: 2021-04-23

## 2021-04-22 MED ORDER — ONDANSETRON HCL (PF) 4 MG/2 ML INJECTION SOLUTION
Freq: Once | INTRAMUSCULAR | Status: DC | PRN
Start: 2021-04-22 — End: 2021-04-22
  Administered 2021-04-22: 4 mg via INTRAVENOUS

## 2021-04-22 MED ORDER — FENTANYL (PF) 50 MCG/ML INJECTION SOLUTION
Freq: Once | INTRAMUSCULAR | Status: DC | PRN
Start: 2021-04-22 — End: 2021-04-22
  Administered 2021-04-22: 100 ug via INTRAVENOUS

## 2021-04-22 MED ORDER — HYDROMORPHONE 1 MG/ML INJECTION WRAPPER
0.4000 mg | INJECTION | INTRAMUSCULAR | Status: DC | PRN
Start: 2021-04-22 — End: 2021-04-22

## 2021-04-22 MED ORDER — SODIUM CHLORIDE 0.9 % (FLUSH) INJECTION SYRINGE
2.0000 mL | INJECTION | Freq: Three times a day (TID) | INTRAMUSCULAR | Status: DC
Start: 2021-04-22 — End: 2021-04-23
  Administered 2021-04-22: 0 mL
  Administered 2021-04-22 – 2021-04-23 (×2): 6 mL

## 2021-04-22 MED ORDER — LACTATED RINGERS INTRAVENOUS SOLUTION
INTRAVENOUS | Status: DC | PRN
Start: 2021-04-22 — End: 2021-04-22

## 2021-04-22 MED ORDER — SUCCINYLCHOLINE 20 MG/ML INTRAVENOUS WRAPPER
INJECTION | INTRAVENOUS | Status: AC
Start: 2021-04-22 — End: 2021-04-22
  Filled 2021-04-22: qty 10

## 2021-04-22 MED ORDER — ROCURONIUM 10 MG/ML INTRAVENOUS SOLUTION
Freq: Once | INTRAVENOUS | Status: DC | PRN
Start: 2021-04-22 — End: 2021-04-22
  Administered 2021-04-22: 20 mg via INTRAVENOUS

## 2021-04-22 MED ORDER — HYDROMORPHONE 1 MG/ML INJECTION WRAPPER
0.2000 mg | INJECTION | INTRAMUSCULAR | Status: DC | PRN
Start: 2021-04-22 — End: 2021-04-22

## 2021-04-22 MED ORDER — FENTANYL (PF) 50 MCG/ML INJECTION SOLUTION
INTRAMUSCULAR | Status: AC
Start: 2021-04-22 — End: 2021-04-22
  Filled 2021-04-22: qty 2

## 2021-04-22 MED ORDER — DIPHENHYDRAMINE 50 MG/ML INJECTION SOLUTION
INTRAMUSCULAR | Status: AC
Start: 2021-04-22 — End: 2021-04-22
  Filled 2021-04-22: qty 1

## 2021-04-22 MED ORDER — LACTATED RINGERS INTRAVENOUS SOLUTION
INTRAVENOUS | Status: DC
Start: 2021-04-22 — End: 2021-04-23
  Administered 2021-04-22: 0 mL via INTRAVENOUS

## 2021-04-22 MED ORDER — GLUCAGON 1 MG/ML SOLUTION FOR INJECTION
Freq: Once | INTRAMUSCULAR | Status: DC | PRN
Start: 2021-04-22 — End: 2021-04-22
  Administered 2021-04-22: .5 mg via INTRAVENOUS

## 2021-04-22 MED ORDER — IOPAMIDOL 300 MG IODINE/ML (61 %) INTRAVENOUS SOLUTION
100.0000 mL | INTRAVENOUS | Status: DC
Start: 2021-04-22 — End: 2021-04-27

## 2021-04-22 MED ORDER — SODIUM PHOSPHATE 3 MMOL/ML INTRAVENOUS SOLUTION
20.0000 mmol | Freq: Once | INTRAVENOUS | Status: AC
Start: 2021-04-22 — End: 2021-04-23
  Administered 2021-04-22: 20 mmol via INTRAVENOUS
  Administered 2021-04-23: 0 mmol via INTRAVENOUS
  Filled 2021-04-22: qty 6.67

## 2021-04-22 MED ORDER — SODIUM CHLORIDE 0.9 % (FLUSH) INJECTION SYRINGE
2.0000 mL | INJECTION | INTRAMUSCULAR | Status: DC | PRN
Start: 2021-04-22 — End: 2021-04-23

## 2021-04-22 MED ORDER — LIDOCAINE (PF) 100 MG/5 ML (2 %) INTRAVENOUS SYRINGE
INJECTION | Freq: Once | INTRAVENOUS | Status: DC | PRN
Start: 2021-04-22 — End: 2021-04-22
  Administered 2021-04-22: 100 mg via INTRAVENOUS

## 2021-04-22 MED ORDER — SODIUM PHOSPHATE 3 MMOL/ML INTRAVENOUS SOLUTION
30.0000 mmol | Freq: Once | INTRAVENOUS | Status: AC
Start: 2021-04-22 — End: 2021-04-22
  Administered 2021-04-22: 0 mmol via INTRAVENOUS
  Administered 2021-04-22: 30 mmol via INTRAVENOUS
  Filled 2021-04-22: qty 10

## 2021-04-22 MED ORDER — SUCCINYLCHOLINE 20 MG/ML INTRAVENOUS WRAPPER
INJECTION | Freq: Once | INTRAVENOUS | Status: DC | PRN
Start: 2021-04-22 — End: 2021-04-22
  Administered 2021-04-22: 100 mg via INTRAVENOUS

## 2021-04-22 SURGICAL SUPPLY — 14 items
CATH ENDOS CLASSIC 5.5FR 200CM CANN TIP METAL DISP ERCP 2.8MM MN ACC CHNL (MED SURG SUPPLIES) ×1 IMPLANT
CATH ENDOS CLASSIC 5.5FR 200CM_CANN TIP METAL DISP ERCP 2.8 (MED SURG SUPPLIES) ×1
COVER ENDOSCP DIST STRL LF  DISP (ENDOSCOPIC SUPPLIES) ×1 IMPLANT
DEVICE ENDOS RX BIOPSY CAP OLMPS STRL DISP (ENDOSCOPIC SUPPLIES) ×1 IMPLANT
DEVICE LOCK RX BIOPSY CAP SYS_LMPS DISP (INSTRUMENTS ENDOMECHANICAL) ×1
ELECTRODE PATIENT RTN 9FT VLAB C30- LB RM PHSV ACRL FOAM CORD NONIRRITATE NONSENSITIZE ADH STRP (SURGICAL CUTTING SUPPLIES) ×1 IMPLANT
ELECTRODE PATIENT RTN 9FT VLAB_REM C30- LB PLHSV ACRL FOAM (CUTTING ELEMENTS) ×1
GW ENDOS .025IN 2700MM 70MM VISIGLIDE 2 ANG TIP FLUORINE HDRPH STRL LF  DISP (ENDOSCOPIC SUPPLIES) ×1 IMPLANT
GW ENDOS .025IN 70MM VISIGLIDE_2 2700MM HDRPH STRL LF DISP (INSTRUMENTS ENDOMECHANICAL) ×1
NEEDLE ASP 1400MM 2.6MM 19GA SH 80MM STRL DISP (ENDOSCOPIC SUPPLIES) ×1 IMPLANT
NEEDLE ASP 1400MM 2.6MM 19GA S_H 80MM STRL DISP (INSTRUMENTS ENDOMECHANICAL) ×1
SINGLE USE DISTAL SCOPE COVER (INSTRUMENTS ENDOMECHANICAL) ×1
SPHINCT 260CM 20MM 4.4FR DREAMTOME DREAMWIRE BIL RX CANN CHNL C CUT WRE ENDOS 5MM .035IN STR (ENDOSCOPIC SUPPLIES) ×1 IMPLANT
SPHINCTERTOME ENDO 4.4FR (INSTRUMENTS ENDOMECHANICAL) ×1

## 2021-04-22 NOTE — Progress Notes (Signed)
BRIEF PROCEDURE NOTE (ERCP/ EUS)    - Pancreatogram revealed P. Divisum   - Unable to cannulate the minor papillae  - EUS showed large peri-pancreatic walled off collection   After discussion with Surgical oncology, it was decided to continue medical management with repeat CT in 2 weeks and then a possible EUS guided drainage with LAMS if persistent collection     RECS:   - ADAT   - Continue to monitor output   Call for questions     Lin Landsman, MD  Advanced Endoscopy Fellow, PGY 7

## 2021-04-22 NOTE — Care Plan (Signed)
Medical Nutrition Therapy Follow Up        SUBJECTIVE : pt currently in OR for ERCP remains on TPN     OBJECTIVE:     Current Diet Order/Nutrition Support:  NPO plus TPN: 1800 cal, 110 g prot, 730 dextrose cal, 630 lipids at 75 ml/hr    Height Used for Calculations: 175.3 cm (5' 9.02")  Weight Used For Calculations: 75.9 kg (167 lb 5.3 oz) (standing weight on 3/3)  Weight trends: 3/14- 84.4 kg (bed) ; 3/23- 84.4 kg (no edema noted on flow sheet)  BMI (kg/m2): 24.75  BMI Assessment: BMI 18.5-24.9: normal  Ideal Body Weight (IBW) (kg): 73.73  % Ideal Body Weight: 102.94     Usual Body Weight: 81.3 kg (179 lb 3.7 oz)  Weight Loss:  (intentional weight loss)      Estimated Needs:    Energy Calorie Requirements: 2100-2300 cal (28-30 cal/75.9 kg)  Protein Requirements (gms/day): 88-110 g prot (1.2-1.5 g/73.7 kg)   Fluid Requirements: 2100-2300 ml (28-30 ml/75.9 k)       Comments: 76 y.o. 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 He underwent massive transfusion protocol and ex lap on 04/03/21 at an outside facility with ligation of a bleeding vessel in the liver bed. Patient was subsequently transferred to [facility] postoperaively, intubated. Patient was subsequently extubated later that day on 3/4. 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        Latest Reference Range & Units 03/17/21 09:17   HEMOGLOBIN A1C <5.7 % 9.3 (H)        Latest Reference Range & Units Most Recent 04/19/21 04:20 04/20/21 04:18 04/21/21 01:42 04/22/21 03:31   PHOSPHORUS 2.3 - 4.0 mg/dL 1.9 (L)  04/22/21 03:31 1.7 (L) 2.3 2.2 (L) 1.9 (L)   (L): Data is abnormally low      Plan/Interventions :   Pt continues to show signs of re-feeding and have been unable to advance to goal TPN,     Consider restarting 100 mg thiamine   Continue Oral multivitamin/mineral due to IV  shortage    Continue to Monitor potassium, magnesium and phosphorus daily along with blood sugars and BMP and will advance TPN to goal as able     Goal  TPN  Rate: ~79 ml/hr  2200 kcal /day = 29 kcal/kg  110 grams Protein = 1.5 grams/ kg  1100 Dextrose kcals, GIR 3  660 IL kcals or 0.9 grams Fat/kg    Continue to Monitor LFTs and triglycerides weekly.   Monitor weekly weights.   Will continue to follow.         Nutrition Diagnosis: Altered GI function related to midline fistula draining stool as evidenced by need for TPN: in progress     Dierdre Searles, RD, LD, [place] 04/22/2021, 13:46  Pager [phone]

## 2021-04-22 NOTE — Progress Notes (Signed)
[facility]  Surgical Oncology  Progress Note      [name], [name], 76 y.o. male  Date of Birth:  March 07, 1945  Date of Admission:  04/03/2021  Date of service: 04/22/2021    Assessment:  This is a 76 y.o. 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 He underwent massive transfusion protocol and ex lap on 04/03/21 at an outside facility with ligation of a bleeding vessel in the liver bed. Patient was subsequently transferred to [facility] postoperaively, intubated. Patient was subsequently extubated later that day on 3/4. 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      Plan/Recommendations:  - Hematochezia resolved  - Diet: NPO, TPN  - Fever (resolved), tachycardia   - Blood cultures 3/20 pending   - CT C/A/P with PO contrast, CTA chest   - UA with few yeast  - Peripancreatic fluid collections   - Plan for GI for PD stent to decrease leak output.              - IR consulted; RUQ drain placement and empiric embolization 3/10              - Blood cultures 3/9: + pseudomonas    - Cultures form R JP: Pseudomonas and enterococcus              - Repeat Blood cultures 3/12 negative   - ID consulted; continue Daptomycin and meropenum (stop date 4/7)   - RUQ JP with no output will consult IR for possible IR drain replacement  - PE   - s/p IVC filter placement 3/17   - Aggressive pulm toilet, supp off O2   - Holding heparin gtt for possible GI procedure  - Labs: reviewed, hypophosphatemia replaced  - IVF: discontinued  - Midline fistula with ostomy bag added to monitor output  - OOB TID  - PT/OT ordered    Subjective:   NAEO. Pain improved     Objective  Filed Vitals:    04/22/21 0046 04/22/21 0320 04/22/21 0350 04/22/21 0800   BP:  135/75  135/71   Pulse:  (!) 106 97 97   Resp:  (!) '22 16 14   '$ Temp:  37.7 C (99.8 F)  37.3 C  (99.1 F)   SpO2: 93% 93% 93% 93%     Physical Exam:   GEN:  AOx4, resting in bed, no acute distress  PULM: Normal respiratory effort.   CV:  Tachycardic, regular rhythm   ABD:   Abdomen soft, minimal ttp RUQ, nondistended. JP in place in RUQ with minimal output,  Lower midline incision with feculent drainage present with ostomy bag over it.  Pigtail catheter present with SS output.  MS: Atraumatic. Moves all extremities.  NEURO:   Alert and oriented to person, place and time.  Cranial nerves grossly intact.    Integumentary:  Pink, warm, and dry  PSYCHOSOCIAL: Pleasant.  Normal affect.     Labs  Results for orders placed or performed during the hospital encounter of 04/03/21 (from the past 24 hour(s))   TRIGLYCERIDE BODY FLUID   Result Value Ref Range    TRIGLYCERIDES BODY FLUID 172 No reference intervals are established. mg/dL   PTT (PARTIAL THROMBOPLASTIN TIME)   Result Value Ref Range    APTT 34.7 24.2 - 37.5 seconds  Narrative    Therapeutic range for unfractionated heparin is 60-100 seconds.   PHOSPHORUS   Result Value Ref Range    PHOSPHORUS 1.9 (L) 2.3 - 4.0 mg/dL   CBC   Result Value Ref Range    WBC 11.4 (H) 3.7 - 11.0 x10^3/uL    RBC 2.54 (L) 4.50 - 6.10 x10^6/uL    HGB 7.5 (L) 13.4 - 17.5 g/dL    HCT 23.6 (L) 38.9 - 52.0 %    MCV 92.9 78.0 - 100.0 fL    MCH 29.5 26.0 - 32.0 pg    MCHC 31.8 31.0 - 35.5 g/dL    RDW-CV 17.4 (H) 11.5 - 15.5 %    PLATELETS 513 (H) 150 - 400 x10^3/uL    MPV 10.5 8.7 - 12.5 fL   MAGNESIUM   Result Value Ref Range    MAGNESIUM 1.9 1.8 - 2.6 mg/dL   BASIC METABOLIC PANEL   Result Value Ref Range    SODIUM 131 (L) 136 - 145 mmol/L    POTASSIUM 4.1 3.5 - 5.1 mmol/L    CHLORIDE 99 96 - 111 mmol/L    CO2 TOTAL 25 23 - 31 mmol/L    ANION GAP 7 4 - 13 mmol/L    CALCIUM 8.1 (L) 8.8 - 10.2 mg/dL    GLUCOSE 169 (H) 65 - 125 mg/dL    BUN 13 8 - 25 mg/dL    CREATININE 0.64 (L) 0.75 - 1.35 mg/dL    BUN/CREA RATIO 20 6 - 22    ESTIMATED GFR >90 >=60 mL/min/BSA   PTT (PARTIAL THROMBOPLASTIN  TIME)   Result Value Ref Range    APTT 41.7 (H) 24.2 - 37.5 seconds    Narrative    Therapeutic range for unfractionated heparin is 60-100 seconds.   AMYLASE BODY FLUID   Result Value Ref Range    AMYLASE BODY FLUID >6,554 No reference intervals are established. U/L   POC BLOOD GLUCOSE (RESULTS)   Result Value Ref Range    GLUCOSE, POC 176 (H) 70 - 105 mg/dl   POC BLOOD GLUCOSE (RESULTS)   Result Value Ref Range    GLUCOSE, POC 164 (H) 70 - 105 mg/dl   POC BLOOD GLUCOSE (RESULTS)   Result Value Ref Range    GLUCOSE, POC 187 (H) 70 - 105 mg/dl   POC BLOOD GLUCOSE (RESULTS)   Result Value Ref Range    GLUCOSE, POC 166 (H) 70 - 105 mg/dl   POC BLOOD GLUCOSE (RESULTS)   Result Value Ref Range    GLUCOSE, POC 197 (H) 70 - 105 mg/dl       I/O:  Date 04/21/21 0700 - 04/22/21 0659 04/22/21 0700 - 04/23/21 0659   Shift 0700-1459 1500-2259 2300-0659 24 Hour Total 0700-1459 1500-2259 2300-0659 24 Hour Total   INTAKE   I.V.(mL/kg/hr)  5400.86(7.61) 1400(2.07) 4081.03(2.01)         Med (IV) Flush Volume  10  10         Volume Infused (adult custom parenteral nutrition)  1052.7  1052.7         Volume Infused (electrolyte-A (PLASMALYTE-A) premix infusion)  1503.33 800 2303.33         Volume Infused (adult custom parenteral nutrition)  115 600 715       IV Piggyback  176  176         Volume (DAPTOmycin (CUBICIN) 800 mg in NS 50 mL IVPB)  66  66  Volume (sodium phosphate 30 mmol in D5W 100 mL IVPB)  110  110       Shift Total(mL/kg)  7619.50(93.26) 1400(16.59) 7124.58(09.98)       OUTPUT   Urine(mL/kg/hr) 1305(1.93) 850(1.26) 700(1.04) 2855(1.41) 200   200     Urine (Voided) 1305 267 778 0946 200   200   Emesis 1   1         Emesis 1   1         Emesis Occurrence 1 x   1 x       Drains 240 310 315 865         JP Drain Output ([place] Drain Right;Upper Abdomen) 10 -10 -10 -10         Drain Output (Drain (Miscellaneous) #58F pigtail catheter for RLQ fluid collection Lower;Right;Anterior Abdomen) 230 320 200 750          Drain Output (Drain (Miscellaneous) peds ostomy bag Lower;Medial Abdomen)  0 125 125       Other             Other 0 x   0 x       Shift Total(mL/kg) 3382(50.53) 9767(34.19) 3790(24.09) 7353(29.92) 200(2.37)   200(2.37)   Weight (kg) 84.4 84.4 84.4 84.4 84.4 84.4 84.4 84.4       Radiology  Results for orders placed or performed during the hospital encounter of 04/03/21 (from the past 72 hour(s))   CT CHEST ABDOMEN PELVIS W IV CONTRAST     Status: None    Narrative    ZADE FALKNER Lafond  Male, 76 years old.    CT CHEST ABDOMEN PELVIS W IV CONTRAST performed on 04/20/2021 12:33 PM.    REASON FOR EXAM:  assess peripancreatic fluid collections, PE, and location of ECF    ADDITIONAL CLINICAL INFORMATION: 76 year old male with a known history of recent distal pancreatectomy and splenectomy with known pulmonary emboli as well as a midline fistula draining stool.    TECHNIQUE: Axial CT images were obtained through the chest, abdomen, and pelvis following the intravenous and oral administration of contrast. The chest portion of the exam was performed with a pulmonary embolus protocol. Coronal and sagittal reformatted images were created.    RADIATION DOSE: 547.63 mGy.cm  CONTRAST: 91 mL of Isovue 370    COMPARISON: CT angiogram of the abdomen and pelvis performed April 09, 2021.    FINDINGS:     CHEST:    LUNGS: Atelectasis is again noted within the lung bases bilaterally, right greater than left. The large central airways are patent.    HEART/MEDIASTINUM: The heart is normal in size without pericardial effusion. The thoracic aorta is within normal limits in caliber and contour. Within the right lower lobe, there are a few small subsegmental distal pulmonary emboli. There are also subocclusive pulmonary emboli seen near the division of the right main pulmonary artery as well as extending into segmental right upper lobe pulmonary arteries. These appear similar to April 09, 2021. No large central or occlusive pulmonary embolus  is detected within the confines of the exam.    PLEURA: Trace bilateral pleural effusions are present, improved from April 09, 2021.    LYMPH NODES: No abnormally enlarged lymph nodes are detected in the chest.    BONES/SOFT TISSUES: A right PICC is in place with the catheter tip in the lower SVC. No acute or destructive bony abnormalities are visible in the chest.      ABDOMEN/PELVIS:  LIVER: Small low-density masses in the peripheral dome and left lobe are most compatible with small cysts, unchanged. There are lobular contour changes of the liver due to the complex perihepatic fluid collection described below.    BILIARY SYSTEM/GALLBLADDER: The gallbladder is within normal limits in size. No intra or extrahepatic bile duct dilation is present.    PANCREAS: Postoperative changes from recent distal pancreatectomy are again demonstrated. A fluid collection with thin rim enhancement at the surgical bed, series 9 image 37, measures 5.7 x 3.3 cm as compared to prior measurements of 7.3 x 5.8 cm.    KIDNEY/URETERS: The kidneys are normal in size with symmetric parenchymal enhancement. Subcentimeter low-density renal masses bilaterally are too small to definitively characterize but likely represent small cysts. No hydronephrosis is present. The ureters are of normal caliber.    ADRENALS: Within normal limits.    SPLEEN: Surgically absent. There is a moderate amount of partially loculated fluid in the splenectomy bed which is similar to the prior CT. A surgical drain remains in place and extends into the splenectomy bed.    BOWEL: The gastric lumen is decompressed. Enteric contrast can be seen within multiple large and small bowel loops to the level of the transverse colon. One of these mid transverse colonic loops is in close approximation to extraluminal contrast which extends into the midline of the abdominal wall at the incision site, series 9 image 52, although no distinct fistulous tract is  visible.    VASCULATURE/LYMPH NODES: The abdominal aorta is normal in caliber with mild scattered atherosclerotic calcifications. The portal, splenic, superior mesenteric, and hepatic veins are patent. An IVC filter is in place. Metallic embolization coils in the upper abdomen corresponding to the recent empiric embolization procedure.    BLADDER: Mildly distended without visible wall thickening.    REPRODUCTIVE ORGANS: The prostate is normal in size.    PERITONEAL CAVITY: Since the previous exam, there has been interval placement of a percutaneous pigtail drainage catheter into the multiloculated complex fluid collection seen along the anterior margin of the liver. This fluid collection has significantly decreased in size and extent. Additionally, the component of fluid collection extending inferiorly from the liver in the right upper quadrant, series 9 image 34, measures 11.7 x 10.3 cm which is improved from prior measurements of 8.1 x 6.0 cm. The small loculated fluid collection adjacent to the posterior wall of the distal stomach, series 9 image 43, has also significantly decreased in size. No new or worsening fluid collections are detected    BONES/SOFT TISSUES: As noted above, there is complex fluid containing dense contrast material at the midline abdominal wall incision site with some scattered locules of air within the collection and adjacent subcutaneous soft tissues. The extraluminal dense contrast is most closely approximated to the mid transverse colon which also contains dense intraluminal contrast. 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 Asymmetric stranding in the right groin is likely related to recent vascular access at this site with some unorganized blood products.      Impression    1. 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    2. Interval placement of a percutaneous drain into the complex fluid collection in the right upper quadrant with overall decreased size and extent of the multiloculated collection in the right upper quadrant as  compared to April 09, 2021. No new or worsening intra-abdominal fluid collections are detected.    3. Unchanged subocclusive right segmental and subsegmental pulmonary emboli as compared to April 09, 2021. No large central pulmonary embolus is present.    4. Trace residual bilateral pleural effusions with bibasilar atelectasis, improved from April 09, 2021.         Current Medications:  Current Facility-Administered Medications   Medication Dose Route Frequency   . acetaminophen (TYLENOL) tablet  650 mg Oral Q4H   . adult custom parenteral nutrition   Intravenous Continuous   . atorvastatin (LIPITOR) tablet  40 mg Oral Daily with Breakfast   . benzonatate (TESSALON) capsule  100 mg Oral Q8H PRN   . D5W 250 mL flush bag   Intravenous Q15 Min PRN   . DAPTOmycin (CUBICIN) 800 mg in NS 50 mL IVPB  10 mg/kg (Adjusted) Intravenous Q24H   . diatrizoate meglumine & sodium oral solution  8 mL Oral Give in Radiology   . docusate sodium (COLACE) capsule  100 mg Oral 2x/day   . electrolyte-A (PLASMALYTE-A) premix infusion   Intravenous Continuous   . elvitegravir-cobicistat-emtricitrabine-tenofovir (GENVOYA) 150-150-200-10 mg per tablet  1 Tablet Oral Daily   . gabapentin (NEURONTIN) capsule  300 mg Oral 3x/day   . HYDROmorphone (DILAUDID) 1 mg/mL injection  0.2 mg Intravenous Q3H PRN   . meropenem (MERREM) 1 g in NS 100 mL IVPB  1 g Intravenous Q8H   . methocarbamol (ROBAXIN) tablet  500 mg Oral 4x/day   . multivitamin-minerals-folic acid-lycopene-lutein (CERTAVITE SENIOR) tablet  1 Tablet Oral Q24H   . NS 250 mL flush bag   Intravenous Q15 Min PRN   . NS flush syringe  2-6 mL Intracatheter Q8HRS   . NS flush syringe  2-6 mL Intracatheter Q1 MIN PRN    . NS flush syringe  10-30 mL Intracatheter Q8HRS   . NS flush syringe  20-30 mL Intracatheter Q1 MIN PRN   . NS flush syringe  10-30 mL Intracatheter Q8HRS   . NS flush syringe  20-30 mL Intracatheter Q1 MIN PRN   . ondansetron (ZOFRAN) 2 mg/mL injection  4 mg Intravenous Q6H PRN   . oxyCODONE (ROXICODONE) immediate release tablet  5 mg Oral Q4H PRN   . oxyCODONE (ROXICODONE) immediate releaste tablet  10 mg Oral Q4H PRN   . pantoprazole (PROTONIX) delayed release tablet  40 mg Oral Q12H   . sennosides-docusate sodium (SENOKOT-S) 8.6-'50mg'$  per tablet  1 Tablet Oral 2x/day   . SSIP insulin R human (HUMULIN R) 100 units/mL injection  0-12 Units Subcutaneous 4x/day PRN       Allene Pyo, PA-C  04/22/2021, 08:39

## 2021-04-22 NOTE — Anesthesia Transfer of Care (Signed)
ANESTHESIA TRANSFER OF CARE   [name] is a 76 y.o. ,male, Weight: 84.4 kg (186 lb 1.1 oz)   had Procedure(s) with comments:  ERCP - PD stent  ENDOSCOPIC U/S UPPER  performed  04/22/21   Primary Service: Nicola Police*    Past Medical History:   Diagnosis Date   . Cancer (CMS [facility])     prostate   . Deep vein thrombosis (DVT) (CMS HCC)     right leg   . Esophageal reflux    . H/O hearing loss    . High cholesterol    . 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 HTN (hypertension)    . 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 Hyperlipidemia     "borderline"   . Hypothyroidism    . MRSA (methicillin resistant staph aureus) culture positive 01/02/2021    MRSA left groin abscess 01/03/21   . MRSA (methicillin resistant staph aureus) culture positive 01/02/2021    MRSA blood 01/02/21   . Pulmonary embolism (CMS HCC) 03/31/2021   . Thyroid disorder    . Wears glasses       Allergy History as of 04/22/21      No Known Allergies              I completed my transfer of care / handoff to the receiving personnel during which we discussed:                                                Additional Info:Pt. To area E stable. Report to RN                        Last OR Temp: Temperature: 37.3 C (99.1 F)  ABG:  PH (ARTERIAL)   Date Value Ref Range Status   04/04/2021 7.41 7.35 - 7.45 Final     PH (T)   Date Value Ref Range Status   03/22/2021 7.36 7.35 - 7.45 Final     PCO2 (ARTERIAL)   Date Value Ref Range Status   04/04/2021 41 35 - 45 mm/Hg Final     PCO2 (VENOUS)   Date Value Ref Range Status   03/31/2021 47 41 - 51 mm/Hg Final     PO2 (ARTERIAL)   Date Value Ref Range Status   04/04/2021 77 72 - 100 mm/Hg Final     PO2 (VENOUS)   Date Value Ref Range Status   03/31/2021 49 35 - 50 mm/Hg Final     SODIUM   Date Value Ref Range Status   03/22/2021 135 (L) 137 - 145 mmol/L Final     POTASSIUM   Date Value Ref Range Status   04/22/2021 4.1 3.5 - 5.1 mmol/L Final      KETONES   Date Value Ref Range Status   04/20/2021 Negative Negative mg/dL Final     WHOLE BLOOD POTASSIUM   Date Value Ref Range Status   03/22/2021 3.6 3.5 - 4.6 mmol/L Final     CHLORIDE   Date Value Ref Range Status   03/22/2021 106 101 - 111 mmol/L Final     CALCIUM   Date Value Ref Range Status   04/22/2021 8.1 (L) 8.8 - 10.2 mg/dL Final  Calculated P Axis   Date Value Ref Range Status   04/03/2021 52 degrees Final     Calculated R Axis   Date Value Ref Range Status   04/04/2021 3 degrees Final     Calculated T Axis   Date Value Ref Range Status   04/04/2021 -7 degrees Final     IONIZED CALCIUM   Date Value Ref Range Status   04/19/2021 1.17 1.10 - 1.35 mmol/L Final     LACTATE   Date Value Ref Range Status   04/04/2021 0.8 0.0 - 1.3 mmol/L Final     HEMOGLOBIN   Date Value Ref Range Status   03/22/2021 11.1 (L) 12.0 - 18.0 g/dL Final     OXYHEMOGLOBIN   Date Value Ref Range Status   03/22/2021 97.8 85.0 - 98.0 % Final     CARBOXYHEMOGLOBIN   Date Value Ref Range Status   03/22/2021 1.8 0.0 - 2.5 % Final     MET-HEMOGLOBIN   Date Value Ref Range Status   03/22/2021 0.4 0.0 - 2.0 % Final     BASE EXCESS   Date Value Ref Range Status   03/31/2021 3.5 (H) -3.0 - 3.0 mmol/L Final     BASE EXCESS (ARTERIAL)   Date Value Ref Range Status   04/04/2021 1.2 (H) 0.0 - 1.0 mmol/L Final     BASE DEFICIT   Date Value Ref Range Status   04/03/2021 0.2 0.0 - 3.0 mmol/L Final     BICARBONATE (ARTERIAL)   Date Value Ref Range Status   04/04/2021 25.8 18.0 - 26.0 mmol/L Final     BICARBONATE (VENOUS)   Date Value Ref Range Status   03/31/2021 27.3 (H) 22.0 - 26.0 mmol/L Final     TEMPERATURE, COMP   Date Value Ref Range Status   03/22/2021 36.9 15.0 - 40.0 C Final     %FIO2 (VENOUS)   Date Value Ref Range Status   03/31/2021 32.0 % Final     Airway:* No LDAs found *  Blood pressure (!) 134/96, pulse (!) 106, temperature 37.3 C (99.1 F), resp. rate 20, height 1.753 m (5' 9.02"), weight 84.4 kg (186 lb 1.1 oz), SpO2 98  %.

## 2021-04-22 NOTE — Nurses Notes (Signed)
Lab called this RN that sample from IR drain still too thick to be resulted for an amylase. Surg oncology Arrin Rolena Infante paged to notify.

## 2021-04-22 NOTE — Anesthesia Preprocedure Evaluation (Addendum)
ANESTHESIA PRE-OP EVALUATION  [name]  Planned Procedure: ERCP (Mouth)  Review of Systems         patient summary reviewed  nursing notes reviewed        Pulmonary  negative pulmonary ROS,    Cardiovascular    Hypertension, ECG reviewed and hyperlipidemia , Exercise Tolerance: > or = 4 METS        GI/Hepatic/Renal    GERD and well controlled        Endo/Other    HIV, low viral load and CD4 > 500 and hypothyroidism,      Neuro/Psych/MS        Cancer  CA,                     Physical Assessment      Airway       Mallampati: II    TM distance: >3 FB    Neck ROM: full  Mouth Opening: good.            Dental       Dentition intact             Pulmonary    Breath sounds clear to auscultation       Cardiovascular    Rhythm: regular  Rate: Normal       Other findings            Plan  ASA 3     Planned anesthesia type: general     general anesthesia with endotracheal tube intubation                  Intravenous induction     Anesthesia issues/risks discussed are: Dental Injuries, Sore Throat, Cardiac Events/MI, PONV, Intraoperative Awareness/ Recall, Post-op Pain Management, Aspiration and Post-op Cognitive Dysfunction.  Anesthetic plan and risks discussed with patient  Signed consent obtained        Use of blood products discussed with patient who consented to blood products.     Patient's NPO status is appropriate for Anesthesia.           Plan discussed with CRNA.

## 2021-04-22 NOTE — Care Plan (Signed)
04/22/21 1005   Therapist Pager   OT Assigned/ Pager # Ethelreda Sukhu [phone]   Rehab Session   Document Type rehab contact note   Total OT Minutes: 0   Clinical Impression   Criteria for Skilled Therapeutic Interventions Met (OT) yes   Daily Activity AM-PAC/6-clicks Score   Patient Mobility Barrier Patient declined treatment  (Pt scheduled this am for ERCP and despite not yet even being in teletrak to leave his room he did not wish to participate in therapy at this time.)

## 2021-04-22 NOTE — Care Plan (Signed)
Plan of care reviewed with patient, verbalizes understanding. Call bell within reach and sitter select in place for safety. Tachycardic 90-110s, tmax 37.9 overnight, otherwise VSS. Placed on 0.5L NC to maintain saturations while asleep. Reports of pain to abdomen controlled with current pain regimen. Encouraged to turn and reposition to prevent skin breakdown. Needs met with hourly rounding. Drains flushed per orders. TPN and MIVF via PICC. Hep gtt continued until 0400. Plan for ERCP today. Continues to work toward discharge goals.     Problem: Adult Inpatient Plan of Care  Goal: Plan of Care Review  Outcome: Ongoing (see interventions/notes)  Goal: Patient-Specific Goal (Individualized)  Outcome: Ongoing (see interventions/notes)  Goal: Absence of Hospital-Acquired Illness or Injury  Outcome: Ongoing (see interventions/notes)  Goal: Optimal Comfort and Wellbeing  Outcome: Ongoing (see interventions/notes)  Goal: Rounds/Family Conference  Outcome: Ongoing (see interventions/notes)     Problem: Fall Injury Risk  Goal: Absence of Fall and Fall-Related Injury  Outcome: Ongoing (see interventions/notes)     Problem: Skin Injury Risk Increased  Goal: Skin Health and Integrity  Outcome: Ongoing (see interventions/notes)     Problem: Parenteral Nutrition  Goal: Effective Intravenous Nutrition Therapy Delivery  Outcome: Ongoing (see interventions/notes)     Problem: Wound Healing Progression  Goal: Optimal Wound Healing  Outcome: Ongoing (see interventions/notes)

## 2021-04-22 NOTE — Nurses Notes (Signed)
Lab refused amylase sample from earlier stating that it is unable to be processed. Lab order renewed, new sample to be sent down with adequate output from IR drain. Surg oncology Arrin Rolena Infante paged to notify. Awaiting call back.

## 2021-04-22 NOTE — Anesthesia Postprocedure Evaluation (Signed)
Anesthesia Post Op Evaluation    Patient: [name]  Procedure(s) with comments:  ERCP - PD stent  ENDOSCOPIC U/S UPPER    Last Vitals:Temperature: 36.6 C (97.9 F) (04/22/21 1430)  Heart Rate: (!) 104 (04/22/21 1430)  BP (Non-Invasive): (!) 150/93 (04/22/21 1430)  Respiratory Rate: 18 (04/22/21 1411)  SpO2: 95 % (04/22/21 1430)    No notable events documented.    Patient is sufficiently recovered from the effects of anesthesia to participate in the evaluation and has returned to their pre-procedure level.  Patient location during evaluation: PACU       Patient participation: complete - patient participated  Level of consciousness: awake and alert and responsive to verbal stimuli    Pain management: adequate  Airway patency: patent    Anesthetic complications: no  Cardiovascular status: acceptable  Respiratory status: acceptable  Hydration status: acceptable  Patient post-procedure temperature: Pt Normothermic   PONV Status: Absent

## 2021-04-22 NOTE — Nurses Notes (Signed)
Patient transferred onto stretcher and taken to 2W for ERCP

## 2021-04-22 NOTE — Care Plan (Signed)
04/22/21 1006   Therapist Pager   PT Assigned/ Pager # Einar Pheasant [phone]   Rehab Session   Document Type rehab contact note   Total PT Minutes: 0   Basic Mobility Am-PAC/6Clicks Score (APPROVED PT Staff, [place], [place], [place], [place], and [facility])   Patient Mobility Barrier Patient declined treatment  (Pt leaving for ERCP procedure soon and declines session due to this.)     Theodosia Blender, PT  Pager: [phone]

## 2021-04-23 DIAGNOSIS — Z9049 Acquired absence of other specified parts of digestive tract: Secondary | ICD-10-CM

## 2021-04-23 LAB — POC BLOOD GLUCOSE (RESULTS)
GLUCOSE, POC: 204 mg/dl — ABNORMAL HIGH (ref 70–105)
GLUCOSE, POC: 179 mg/dl — ABNORMAL HIGH (ref 70–105)
GLUCOSE, POC: 162 mg/dl — ABNORMAL HIGH (ref 70–105)
GLUCOSE, POC: 197 mg/dl — ABNORMAL HIGH (ref 70–105)
GLUCOSE, POC: 204 mg/dl — ABNORMAL HIGH (ref 70–105)

## 2021-04-23 LAB — BASIC METABOLIC PANEL
ANION GAP: 8 mmol/L (ref 4–13)
BUN/CREA RATIO: 24 — ABNORMAL HIGH (ref 6–22)
BUN: 16 mg/dL (ref 8–25)
CALCIUM: 7.5 mg/dL — ABNORMAL LOW (ref 8.8–10.2)
CHLORIDE: 100 mmol/L (ref 96–111)
CO2 TOTAL: 26 mmol/L (ref 23–31)
CREATININE: 0.67 mg/dL — ABNORMAL LOW (ref 0.75–1.35)
ESTIMATED GFR: >90 mL/min/BSA (ref >=60)
GLUCOSE: 154 mg/dL — ABNORMAL HIGH (ref 65–125)
POTASSIUM: 3.7 mmol/L (ref 3.5–5.1)
SODIUM: 134 mmol/L — ABNORMAL LOW (ref 136–145)

## 2021-04-23 LAB — CBC
HCT: 23.3 % — ABNORMAL LOW (ref 38.9–52.0)
HGB: 7.3 g/dL — ABNORMAL LOW (ref 13.4–17.5)
MCH: 29.1 pg (ref 26.0–32.0)
MCHC: 31.3 g/dL (ref 31.0–35.5)
MCV: 92.8 fL (ref 78.0–100.0)
MPV: 10 fL (ref 8.7–12.5)
PLATELETS: 357 10*3/uL (ref 150–400)
RBC: 2.51 10*6/uL — ABNORMAL LOW (ref 4.50–6.10)
RDW-CV: 17.5 % — ABNORMAL HIGH (ref 11.5–15.5)
WBC: 10.2 10*3/uL (ref 3.7–11.0)

## 2021-04-23 LAB — PHOSPHORUS: PHOSPHORUS: 2.1 mg/dL — ABNORMAL LOW (ref 2.3–4.0)

## 2021-04-23 LAB — C-REACTIVE PROTEIN(CRP),INFLAMMATION: CRP INFLAMMATION: 78.6 mg/L — ABNORMAL HIGH (ref <8.0)

## 2021-04-23 LAB — PTT (PARTIAL THROMBOPLASTIN TIME)
APTT: 30.7 seconds (ref 24.2–37.5)
APTT: 37 seconds (ref 24.2–37.5)

## 2021-04-23 LAB — MAGNESIUM: MAGNESIUM: 1.9 mg/dL (ref 1.8–2.6)

## 2021-04-23 MED ORDER — SODIUM PHOSPHATE 3 MMOL/ML INTRAVENOUS SOLUTION
30.0000 mmol | Freq: Once | INTRAVENOUS | Status: AC
Start: 2021-04-23 — End: 2021-04-23
  Administered 2021-04-23: 0 mmol via INTRAVENOUS
  Administered 2021-04-23: 30 mmol via INTRAVENOUS
  Filled 2021-04-23: qty 10

## 2021-04-23 MED ORDER — HEPARIN 25,000 UNIT/250 ML (100 UNIT/ML) IN NS IV SOLN
12.0000 [IU]/kg/h | INTRAVENOUS | Status: DC
Start: 2021-04-23 — End: 2021-04-30
  Administered 2021-04-23: 12 [IU]/kg/h via INTRAVENOUS
  Administered 2021-04-23 (×2): 16 [IU]/kg/h via INTRAVENOUS
  Administered 2021-04-24 (×3): 22 [IU]/kg/h via INTRAVENOUS
  Administered 2021-04-24 (×2): 18 [IU]/kg/h via INTRAVENOUS
  Administered 2021-04-25 (×5): 21 [IU]/kg/h via INTRAVENOUS
  Administered 2021-04-26: 18 [IU]/kg/h via INTRAVENOUS
  Administered 2021-04-26: 22 [IU]/kg/h via INTRAVENOUS
  Administered 2021-04-27: 20 [IU]/kg/h via INTRAVENOUS
  Administered 2021-04-27: 19 [IU]/kg/h via INTRAVENOUS
  Administered 2021-04-27: 21 [IU]/kg/h via INTRAVENOUS
  Administered 2021-04-27 (×2): 20 [IU]/kg/h via INTRAVENOUS
  Administered 2021-04-28: 23 [IU]/kg/h via INTRAVENOUS
  Administered 2021-04-28: 19 [IU]/kg/h via INTRAVENOUS
  Administered 2021-04-28: 21 [IU]/kg/h via INTRAVENOUS
  Administered 2021-04-28: 23 [IU]/kg/h via INTRAVENOUS
  Administered 2021-04-29 (×2): 22 [IU]/kg/h via INTRAVENOUS
  Administered 2021-04-29 (×2): 21 [IU]/kg/h via INTRAVENOUS
  Administered 2021-04-30: 0 [IU]/kg/h via INTRAVENOUS
  Administered 2021-04-30: 23 [IU]/kg/h via INTRAVENOUS
  Filled 2021-04-23 (×10): qty 250

## 2021-04-23 MED ORDER — SODIUM PHOSPHATE 3 MMOL/ML INTRAVENOUS SOLUTION
30.0000 mmol | Freq: Once | INTRAVENOUS | Status: AC
Start: 2021-04-23 — End: 2021-04-23
  Administered 2021-04-23: 30 mmol via INTRAVENOUS
  Administered 2021-04-23: 0 mmol via INTRAVENOUS
  Filled 2021-04-23: qty 10

## 2021-04-23 MED ORDER — WATER FOR INJECTION, STERILE INTRAVENOUS SOLUTION
INTRAVENOUS | Status: AC
Start: 2021-04-23 — End: 2021-04-24
  Filled 2021-04-23: qty 1100

## 2021-04-23 NOTE — Care Plan (Signed)
[facility]  Medical Nutrition Therapy Follow Up    SUBJECTIVE: Visited with patient and wife this am, diet has been advanced to clear liquids while TPN continues to infuse @ 42m/hr. Patient requesting Vanilla Ensure Plus, explained to patient that while on clear liquids he can have ensure clear and as soon as diet advanced he can have Vanilla Ensure Plus. Patient expressed understanding and agreed to Apple Ensure Clear TID as he has been drinking apple juice.  Patient states he is feeling much better and is hungry, he is going to speak with MD to have diet advanced.  Diet advanced this am to Regular.    OBJECTIVE:     Current Diet Order/Nutrition Support:  MNT PROTOCOL FOR DIETITIAN  adult custom parenteral nutrition  DIET CLEAR LIQUID     Height Used for Calculations: 175.3 cm (5' 9.02")  Weight Used For Calculations: 75.9 kg (167 lb 5.3 oz) (standing weight on 3/3)  BMI (kg/m2): 24.75  BMI Assessment: BMI 18.5-24.9: normal  Ideal Body Weight (IBW) (kg): 73.73  % Ideal Body Weight: 102.94     Usual Body Weight: 81.3 kg (179 lb 3.7 oz)  Usual Body Weight: 81.3 kg (179 lb 3.7 oz)  Weight Loss:  (intentional weight loss)    Re-assessed needs if applicable:    Energy Calorie Requirements: 2100-2300 cal (28-30 cal/75.9 kg) per day   Protein Requirements (gms/day): 88-110 g prot (1.2-1.5 g/73.7 kg) per day   Fluid Requirements: 2100-2300 ml (28-30 ml/75.9 k) per day     Comments: Pleasant 76yo gentleman who was admitted from an outside facility with abdominal pain. 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 Patient has been NPO with TPN infusing, due to abnormal labs TPN had not reached goal, however current rate will be appropriate with diet advance and Ensure plus supplements.  Current TPN:  Rate: ~75 ml/hr  1800 kcal /day = 24 kcal/kg  110 grams Protein = 1.5 grams/ kg  730 Dextrose kcals, GIR 3  660 IL kcals or 0.9 grams  Fat/kg  With  Vanilla Ensure Plus (350kcal/20gm pro ea)    Plan/Interventions : Diet as per MD-advanced to Regular this am                                   Offer Vanilla Ensure Plus TID as per patient preference (350kcal/20gm pro ea)                                    Maintain current TPN rate until po intake meets >50% of estimated nutrition needs and patient drinking Ensure Plus.                                   Monitor labs, weights and tolerance to po diet                                   RD will continue to follow for any further nutrition needs.       Nutrition Diagnosis: Altered GI function related to midline fistula as evidenced by need for TPN. (progressing  Levon Hedger, RD/LD  04/23/2021, 09:57  Pager # [phone]

## 2021-04-23 NOTE — Consults (Signed)
[facility]  Digestive Diseases Consult Follow Up    [name], [name], 76 y.o. male  Date of Service: 04/23/2021  Date of Birth:  [birth year]    Hospital Day:  LOS: 20 days     Assessment/Recommendations:  [name] is a 76 y.o. male with history of HIV infection, hypertension, hyperlipidemia. 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 He underwent ex lap on 04/03/21 at an outside facility with ligation of a bleeding vessel in the liver bed. IR placed RUQ drain and performed empiric embolization on 3/10 for peripancreatic fluid collections. On 3/11, patient developed a midline fistula draining stool. Imaging on 3/21 shows improvement in fluid collections but persistent fluid around splenectomy bed with surgical drain coursing through it. Advanced GI is consulted for endoscopic evaluation.     Problem List:  1. Imaging concerning for PD leak  2. Peripancreatic fluid collections s/p RUQ drain and empiric embolization  3. 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 Pseudomonas bacteremia  5. 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 HIV infection on treatment     Recommendations:  - s/p ERCP with inability to cannulate minor papillae. EUS notable for large peri-pancreatic walled off collection  - Case discussed with surgical oncology. Will plan for repeat CT in 2 weeks and possible EUS guided drainage with LAMS if collection is persistent.  - Monitor LFT's, CBC and BMP  - Keep INR <1.5, platelets >50,000 and Hgb >8    [facility] will follow. Please call with questions or concerns.     Subjective: No acute events overnight. Denies any new complaints this AM. Discussed results of  procedure performed yesterday.     Objective:  Temperature: 36.9 C (98.4 F)  Heart Rate: (!) 108  BP (Non-Invasive): 130/80  Respiratory Rate: 14  SpO2: 95 %  General:           No apparent distress. Appears chronically ill.   Eyes:                Pupils equal and round. Sclera non-icteric   Mouth:    Mucous membranes moist  Neck:               Supple, trachea midline  Lungs:              Unlabored effort, equal bilateral chest rise  Heart:               RRR  Abdomen:         Soft. TTP over RUQ. JP drain with minimal output. Ostomy draining brown stool.  Extremities:      No cyanosis. Moves all extremities.   Neuro:               Grossly normal  Skin:                  Warm and dry    Labs:  reviewed 04/23/21 with Hgb 7.3 Plt 357    Imaging Studies: I have reviewed CT A/P 04/20/21 with colocutaneous fistula and drain placement in RUQ fluid collection.    Joyice Faster, M.D.  Gastroenterology & Hepatology Fellow  04/23/2021 - 08:26      I saw and examined the patient.  I reviewed the fellow's note.  I agree with the findings and plan of care as documented in the fellow's note.  Any exceptions/additions are edited/noted.    Murrell Converse, MD  04/23/2021, 09:07  Assistant Professor, Gastroenterology - Advanced Endoscopy  [facility], [place]

## 2021-04-23 NOTE — Ancillary Notes (Signed)
[facility]  Spiritual Care Note    Patient Name:  [name]  Date of Encounter:   04/23/2021    Other Pertinent Information: Armanie reports to be receiving a good spiritual care service from his local congregation minister. Chaplains are here 24/7     04/23/21 1027   Clinical Encounter Type   Reason for Visit Patient/Person/Family Request   Referral From Care Management   [employer] Visit At This Time Yes   Visited With Patient;Spouse   Patient Spiritual Encounters   Spiritual Needs/Issues Unable to assess (comment)  (Pt prefers to be seen by his local congregation minister)   Time of Encounters   Start Time 1020   Stop Time 1027   Duration (minutes) 7 Minutes     Sherlynn Carbon, [place]  Pager: [phone]  Total Time of Encounter: 9 min.

## 2021-04-23 NOTE — Care Plan (Signed)
[place]  Physical Therapy Progress Note      Patient Name: [name]  Date of Birth: 04/02/1945  Height:  175.3 cm (5' 9.02")  Weight:  84.4 kg (186 lb 1.1 oz)  Room/Bed: 14/A  Payor: MEDICARE / Plan: MEDICARE PART A AND B / Product Type: Medicare /     Assessment:     PT treatment complete, pt tolerating well, demonstrating bed mobility to EOB with Mod A with instruction for modified log roll, STS to FWW with Mod A x2, and ambulates 53f in room Min A x2 with cues for sequencing and safety throughout. Pt limited by significant UE and LE weakness, limited endurance, and balance deficits. Anticipate D/C to inpatient rehab when medically appropriate.    Discharge Needs:   Equipment Recommendation: TBD      Discharge Disposition: inpatient rehabilitation facility    JUSTIFICATION OF DISCHARGE RECOMMENDATION   Based on current diagnosis, functional performance prior to admission, and current functional performance, this patient requires continued PT services in inpatient rehabilitation facility in order to achieve significant functional improvements in these deficit areas: aerobic capacity/endurance, gait, locomotion, and balance, muscle performance.      Plan:   Continue to follow patient according to established plan of care.  The risks/benefits of therapy have been discussed with the patient/caregiver and he/she is in agreement with the established plan of care.     Subjective & Objective:        04/23/21 1450   Therapist Pager   PT Assigned/ Pager # CEinar [phone]  Rehab Session   Document Type therapy progress note (daily note)   Total PT Minutes: 28   Patient Effort good   Symptoms Noted During/After Treatment fatigue   General Information   Patient Profile Reviewed yes   Onset of Illness/Injury or Date of Surgery 04/03/21   Medical Lines Peripheral Drain;PIV Line;Telemetry   Respiratory Status room air   Existing Precautions/Restrictions full code;fall precautions    Mutuality/Individual Preferences   Individualized Care Needs OOB with FWoodlandAx1-2 Pivot   Pre Treatment Status   Pre Treatment Patient Status Patient supine in bed;Call light within reach;Telephone within reach;Sitter select activated   Support Present Pre Treatment  Family present   Communication Pre Treatment  Nurse   Communication Pre Treatment Comment cleared for session   Cognitive Assessment/Interventions   Behavior/Mood Observations behavior appropriate to situation, WNL/WFL   Orientation Status oriented x 4   Attention WNL/WFL   Follows Commands WNL   Vital Signs   Vitals Comment VSS   Pain Assessment   Pre/Posttreatment Pain Comment no c/o pain   Bed Mobility Assessment/Treatment   Supine-Sit [place] moderate assist (50% patient effort);verbal cues required;nonverbal cues required (demo/gesture)   Safety Issues decreased use of legs for bridging/pushing;impaired trunk control for bed mobility;decreased use of arms for pushing/pulling   Impairments balance impaired;endurance;strength decreased;flexibility decreased   Roll Left [place] minimum assist (75% patient effort)   Transfer Assessment/Treatment   Sit-Stand [place] moderate assist (50% patient effort);2 person assist required;verbal cues required;nonverbal cues required (demo/gesture)   Stand-Sit Independence moderate assist (50% patient effort);2 person assist required;verbal cues required;nonverbal cues required (demo/gesture)   Sit-Stand-Sit, Assist Device walker, front wheeled   Bed-Chair Independence minimum assist (75% patient effort);2 person assist required;verbal cues required;nonverbal cues required (demo/gesture)   Chair-Bed Independence minimum assist (75% patient effort);2 person assist required;verbal cues required;nonverbal cues required (demo/gesture)   Bed-Chair-Bed Assist Device walker, front wheeled  Transfer Safety Issues balance decreased during turns;step length decreased   Transfer Impairments balance  impaired;endurance;strength decreased;flexibility decreased   Gait Assessment/Treatment   Total Distance Ambulated 5   Independence  minimum assist (75% patient effort);2 person assist required;verbal cues required;nonverbal cues required (demo/gesture)   Assistive Device  walker, front wheeled   Distance in Feet 5   Gait Speed decreased   Deviations  step length decreased   Safety Issues  balance decreased during turns;step length decreased   Impairments  balance impaired;endurance;strength decreased;flexibility decreased   Balance Skill Training   Comment with FWW in standing   Sitting Balance: Static fair balance   Sitting, Dynamic (Balance) fair balance   Sit-to-Stand Balance fair - balance   Standing Balance: Static fair - balance   Standing Balance: Dynamic fair - balance   Systems Impairment Contributing to Balance Disturbance musculoskeletal   Post Treatment Status   Post Treatment Patient Status Patient sitting in bedside chair or w/c;Call light within reach;Telephone within reach   Support Present Post Treatment  Family present   Financial trader Nurse   Basic Mobility Am-PAC/6Clicks Score (APPROVED PT Staff, [facility], [place], [place], [place], and F[facility])   Turning in bed without bedrails 3   Lying on back to sitting on edge of flat bed 3   Moving to and from a bed to a chair 2   Standing up from chair 2   Walk in room 2   Climbing 3-5 steps with railing 1   6 Clicks Raw Score total 13   Standardized (t-scale) score 33.99   CMS 0-100% Score 57.65   CMS Modifier CK   Patient Mobility Goal ([place]) 4- Move to chair 3X/day   Exercise/Activity Level Performed 6- Walked 10 steps or more   Physical Therapy Clinical Impression   Assessment PT treatment complete, pt tolerating well, demonstrating bed mobility to EOB with Mod A with instruction for modified log roll, STS to FWW with Mod A x2, and ambulates 47f in room Min A x2 with cues for sequencing and safety throughout. Pt limited by significant UE and  LE weakness, limited endurance, and balance deficits. Anticipate D/C to inpatient rehab when medically appropriate.   Anticipated Equipment Needs at Discharge (PT) TBD   Anticipated Discharge Disposition inpatient rehabilitation facility       Therapist:   CTheodosia Blender PT   Pager #: [phone]

## 2021-04-23 NOTE — Care Management Notes (Addendum)
[facility]  Care Management Note    Patient Name: [name]  Date of Birth: 1945-05-15  Sex: male  Date/Time of Admission: 04/03/2021  2:01 PM  Room/Bed: 14/A  Payor: MEDICARE / Plan: MEDICARE PART A AND B / Product Type: Medicare /    LOS: 20 days   Primary Care Providers:  [place] (General)    Admitting Diagnosis:  Intra abdominal hemorrhage [R58]    Assessment:      04/23/21 1442   Assessment Details   Assessment Type Continued Assessment   Date of Care Management Update 04/23/21   Date of Next DCP Update 04/26/21   Care Management Plan   Discharge Planning Status plan in progress   Projected Discharge Date 04/26/21   Discharge plan discussed with: Patient   Discharge Needs Assessment   Discharge Facility/Level of Care Needs Acute Rehab Placement/Return (not psych)(code 62)   Per chart review, ID consulted; continue Daptomycin and meropenum (stop date 4/7), RUQ JP with no output will consult IR for possible IR drain replacement, Aggressive pulm toilet, supp off O2, monitoring labs; hypophosphatemia replaced, Midline fistula with ostomy bag added to monitor output, PT/OT ordered; recs IPR on DC.    ADDENDUM: MSW met with pt and wife at bedside to discuss DC planning and they asked for referral to [facility] in [place].  Wife stated that they will need an auth and referral from the [facility] to be approved.  MSW sent referral via [employer] and will follow for DC.    Discharge Plan:  Acute Rehab Placement/Return (not psych) (code 67)    The patient will continue to be evaluated for developing discharge needs.     Case Manager: Darron Doom., SOCIAL WORKER  Phone: [phone]

## 2021-04-23 NOTE — Progress Notes (Signed)
[name]  Surgical Oncology  Progress Note      [name], [name], 76 y.o. male  Date of Birth:  Apr 04, 1945  Date of Admission:  04/03/2021  Date of service: 04/23/2021    Assessment:  This is a 76 y.o. 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 He underwent massive transfusion protocol and ex lap on 04/03/21 at an outside facility with ligation of a bleeding vessel in the liver bed. Patient was subsequently transferred to [facility] postoperaively, intubated. Patient was subsequently extubated later that day on 3/4. 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      Plan/Recommendations:  - Hematochezia resolved  - Diet: NPO, TPN  - Fever (resolved), tachycardia   - Blood cultures 3/20 pending   - CT C/A/P with PO contrast, CTA chest   - UA with few yeast  - Peripancreatic fluid collections   - GI scope yesterday unable to stent PD duct, patient with pancreatic divisim              - IR consulted; RUQ drain placement and empiric embolization 3/10              - Blood cultures 3/9: + pseudomonas    - Cultures form R JP: Pseudomonas and enterococcus              - Repeat Blood cultures 3/12 negative   - ID consulted; continue Daptomycin and meropenum (stop date 4/7)   - RUQ JP with no output will consult IR for possible IR drain replacement  - PE   - s/p IVC filter placement 3/17   - Aggressive pulm toilet, supp off O2   - Holding heparin gtt for possible GI procedure  - Labs: reviewed, hypophosphatemia replaced  - IVF: discontinued  - Midline fistula with ostomy bag added to monitor output  - OOB TID  - PT/OT ordered    Subjective:   Resting comfortably in bed this am.     Objective  Filed Vitals:    04/22/21 1930 04/22/21 2340 04/23/21 0302 04/23/21 0653   BP: 126/78 109/63 123/71 130/80   Pulse: (!) 108      Resp: 14       Temp: 37 C (98.6 F) 36.9 C (98.4 F) 36.9 C (98.4 F) 36.9 C (98.4 F)   SpO2:         Physical Exam:   GEN:  AOx4, resting in bed, no acute distress  PULM: Normal respiratory effort.   CV:  Tachycardic, regular rhythm   ABD:   Abdomen soft, minimal ttp RUQ, nondistended. JP in place in RUQ with minimal output,  Lower midline incision with feculent drainage present with ostomy bag over it.  Pigtail catheter present with SS output.  MS: Atraumatic. Moves all extremities.  NEURO:   Alert and oriented to person, place and time.  Cranial nerves grossly intact.    Integumentary:  Pink, warm, and dry  PSYCHOSOCIAL: Pleasant.  Normal affect.     Labs  Results for orders placed or performed during the hospital encounter of 04/03/21 (from the past 24 hour(s))   BASIC METABOLIC PANEL   Result Value Ref Range    SODIUM 134 (L) 136 - 145 mmol/L    POTASSIUM 3.7 3.5 - 5.1 mmol/L    CHLORIDE 100  96 - 111 mmol/L    CO2 TOTAL 26 23 - 31 mmol/L    ANION GAP 8 4 - 13 mmol/L    CALCIUM 7.5 (L) 8.8 - 10.2 mg/dL    GLUCOSE 154 (H) 65 - 125 mg/dL    BUN 16 8 - 25 mg/dL    CREATININE 0.67 (L) 0.75 - 1.35 mg/dL    BUN/CREA RATIO 24 (H) 6 - 22    ESTIMATED GFR >90 >=60 mL/min/BSA   CBC   Result Value Ref Range    WBC 10.2 3.7 - 11.0 x10^3/uL    RBC 2.51 (L) 4.50 - 6.10 x10^6/uL    HGB 7.3 (L) 13.4 - 17.5 g/dL    HCT 23.3 (L) 38.9 - 52.0 %    MCV 92.8 78.0 - 100.0 fL    MCH 29.1 26.0 - 32.0 pg    MCHC 31.3 31.0 - 35.5 g/dL    RDW-CV 17.5 (H) 11.5 - 15.5 %    PLATELETS 357 150 - 400 x10^3/uL    MPV 10.0 8.7 - 12.5 fL   MAGNESIUM   Result Value Ref Range    MAGNESIUM 1.9 1.8 - 2.6 mg/dL   PHOSPHORUS   Result Value Ref Range    PHOSPHORUS 2.1 (L) 2.3 - 4.0 mg/dL   POC BLOOD GLUCOSE (RESULTS)   Result Value Ref Range    GLUCOSE, POC 201 (H) 70 - 105 mg/dl   POC BLOOD GLUCOSE (RESULTS)   Result Value Ref Range    GLUCOSE, POC 254 (H) 70 - 105 mg/dl   POC BLOOD GLUCOSE (RESULTS)   Result Value Ref Range    GLUCOSE, POC 210 (H) 70 - 105  mg/dl   POC BLOOD GLUCOSE (RESULTS)   Result Value Ref Range    GLUCOSE, POC 167 (H) 70 - 105 mg/dl   POC BLOOD GLUCOSE (RESULTS)   Result Value Ref Range    GLUCOSE, POC 204 (H) 70 - 105 mg/dl       I/O:  Date 04/22/21 0700 - 04/23/21 0659 04/23/21 0700 - 04/24/21 0659   Shift 0700-1459 1500-2259 2300-0659 24 Hour Total 0700-1459 1500-2259 2300-0659 24 Hour Total   INTAKE   P.O. 60   60         Oral 60   60       I.V.(mL/kg/hr) 600(0.89) 1085(1.61)  1685(0.83)         Volume Infused (adult custom parenteral nutrition)  1085  1085         Volume Infused (LR premix infusion) 600   600       IV Piggyback  178 100 278         Volume (DAPTOmycin (CUBICIN) 800 mg in NS 50 mL IVPB)  66  66         Volume (sodium phosphate 30 mmol in D5W 100 mL IVPB)  112  112         Volume (sodium phosphate 20 mmol in D5W 100 mL IVPB)   100 100       Shift Total(mL/kg) 299(3.71) 6967(89.38) 100(1.18) 1017(51.02)       OUTPUT   Urine(mL/kg/hr) 775(1.15) 460(0.68) 850(1.26) 2085(1.03)         Urine (Voided) 775 [phone]       Drains 30 380 140 550         Drain Output (Drain (Miscellaneous) #102F pigtail catheter for RLQ fluid collection Lower;Right;Anterior Abdomen) 30 330 140 500  Drain Output (Drain (Miscellaneous) peds ostomy bag Lower;Medial Abdomen)  50 0 50       Stool             Stool Occurrence  1 x  1 x       Shift Total(mL/kg) 805(9.54) 840(9.95) 990(11.73) 8185(63.14)       Weight (kg) 84.4 84.4 84.4 84.4 84.4 84.4 84.4 84.4       Radiology  Results for orders placed or performed during the hospital encounter of 04/03/21 (from the past 72 hour(s))   CT CHEST ABDOMEN PELVIS W IV CONTRAST     Status: None    Narrative    [name]  Male, 76 years old.    CT CHEST ABDOMEN PELVIS W IV CONTRAST performed on 04/20/2021 12:33 PM.    REASON FOR EXAM:  assess peripancreatic fluid collections, PE, and location of ECF    ADDITIONAL CLINICAL INFORMATION: 76 year old male with a known history of recent distal  pancreatectomy and splenectomy with known pulmonary emboli as well as a midline fistula draining stool.    TECHNIQUE: Axial CT images were obtained through the chest, abdomen, and pelvis following the intravenous and oral administration of contrast. The chest portion of the exam was performed with a pulmonary embolus protocol. Coronal and sagittal reformatted images were created.    RADIATION DOSE: 547.63 mGy.cm  CONTRAST: 91 mL of Isovue 370    COMPARISON: CT angiogram of the abdomen and pelvis performed April 09, 2021.    FINDINGS:     CHEST:    LUNGS: Atelectasis is again noted within the lung bases bilaterally, right greater than left. The large central airways are patent.    HEART/MEDIASTINUM: The heart is normal in size without pericardial effusion. The thoracic aorta is within normal limits in caliber and contour. Within the right lower lobe, there are a few small subsegmental distal pulmonary emboli. There are also subocclusive pulmonary emboli seen near the division of the right main pulmonary artery as well as extending into segmental right upper lobe pulmonary arteries. These appear similar to April 09, 2021. No large central or occlusive pulmonary embolus is detected within the confines of the exam.    PLEURA: Trace bilateral pleural effusions are present, improved from April 09, 2021.    LYMPH NODES: No abnormally enlarged lymph nodes are detected in the chest.    BONES/SOFT TISSUES: A right PICC is in place with the catheter tip in the lower SVC. No acute or destructive bony abnormalities are visible in the chest.      ABDOMEN/PELVIS:     LIVER: Small low-density masses in the peripheral dome and left lobe are most compatible with small cysts, unchanged. There are lobular contour changes of the liver due to the complex perihepatic fluid collection described below.    BILIARY SYSTEM/GALLBLADDER: The gallbladder is within normal limits in size. No intra or extrahepatic bile duct dilation is  present.    PANCREAS: Postoperative changes from recent distal pancreatectomy are again demonstrated. A fluid collection with thin rim enhancement at the surgical bed, series 9 image 37, measures 5.7 x 3.3 cm as compared to prior measurements of 7.3 x 5.8 cm.    KIDNEY/URETERS: The kidneys are normal in size with symmetric parenchymal enhancement. Subcentimeter low-density renal masses bilaterally are too small to definitively characterize but likely represent small cysts. No hydronephrosis is present. The ureters are of normal caliber.    ADRENALS: Within normal limits.    SPLEEN: Surgically absent.  There is a moderate amount of partially loculated fluid in the splenectomy bed which is similar to the prior CT. A surgical drain remains in place and extends into the splenectomy bed.    BOWEL: The gastric lumen is decompressed. Enteric contrast can be seen within multiple large and small bowel loops to the level of the transverse colon. One of these mid transverse colonic loops is in close approximation to extraluminal contrast which extends into the midline of the abdominal wall at the incision site, series 9 image 52, although no distinct fistulous tract is visible.    VASCULATURE/LYMPH NODES: The abdominal aorta is normal in caliber with mild scattered atherosclerotic calcifications. The portal, splenic, superior mesenteric, and hepatic veins are patent. An IVC filter is in place. Metallic embolization coils in the upper abdomen corresponding to the recent empiric embolization procedure.    BLADDER: Mildly distended without visible wall thickening.    REPRODUCTIVE ORGANS: The prostate is normal in size.    PERITONEAL CAVITY: Since the previous exam, there has been interval placement of a percutaneous pigtail drainage catheter into the multiloculated complex fluid collection seen along the anterior margin of the liver. This fluid collection has significantly decreased in size and extent. Additionally, the component  of fluid collection extending inferiorly from the liver in the right upper quadrant, series 9 image 34, measures 11.7 x 10.3 cm which is improved from prior measurements of 8.1 x 6.0 cm. The small loculated fluid collection adjacent to the posterior wall of the distal stomach, series 9 image 43, has also significantly decreased in size. No new or worsening fluid collections are detected    BONES/SOFT TISSUES: As noted above, there is complex fluid containing dense contrast material at the midline abdominal wall incision site with some scattered locules of air within the collection and adjacent subcutaneous soft tissues. The extraluminal dense contrast is most closely approximated to the mid transverse colon which also contains dense intraluminal contrast. 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 Asymmetric stranding in the right groin is likely related to recent vascular access at this site with some unorganized blood products.      Impression    1. 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    2. Interval placement of a percutaneous drain into the complex fluid collection in the right upper quadrant with overall decreased size and extent of the multiloculated collection in the right upper quadrant as compared to April 09, 2021. No new or worsening intra-abdominal fluid collections are detected.    3. Unchanged subocclusive right segmental and subsegmental pulmonary emboli as compared to April 09, 2021. No large central pulmonary embolus is present.    4. Trace residual bilateral pleural effusions with bibasilar atelectasis, improved from April 09, 2021.     EUS PROCEDURE     Status: None    Narrative    *Procedure not read by radiology.    *Please Refer to Procedure Note for result.    FLUORO ERCP     Status: None    Narrative    *Procedure not read by radiology.    *Please Refer to Procedure Note for result.       Current Medications:  Current Facility-Administered Medications   Medication Dose Route Frequency   . acetaminophen (TYLENOL) tablet  650 mg Oral Q4H   .  adult custom parenteral nutrition   Intravenous Continuous   . atorvastatin (LIPITOR) tablet  40 mg Oral Daily with Breakfast   . benzonatate (TESSALON) capsule  100 mg Oral Q8H PRN   . D5W 250 mL flush bag   Intravenous Q15 Min PRN   . D5W 250 mL flush bag   Intravenous Q15 Min PRN   . DAPTOmycin (CUBICIN) 800 mg in NS 50 mL IVPB  10 mg/kg (Adjusted) Intravenous Q24H   . diatrizoate meglumine & sodium oral solution  8 mL Oral Give in Radiology   . docusate sodium (COLACE) capsule  100 mg Oral 2x/day   . elvitegravir-cobicistat-emtricitrabine-tenofovir (GENVOYA) 150-150-200-10 mg per tablet  1 Tablet Oral Daily   . gabapentin (NEURONTIN) capsule  300 mg Oral 3x/day   . HYDROmorphone (DILAUDID) 1 mg/mL injection  0.2 mg Intravenous Q3H PRN   . iopamidol (ISOVUE-300) 61% infusion  100 mL Intravenous Give in GI   . meropenem (MERREM) 1 g in NS 100 mL IVPB  1 g Intravenous Q8H   . methocarbamol (ROBAXIN) tablet  500 mg Oral 4x/day   . multivitamin-minerals-folic acid-lycopene-lutein (CERTAVITE SENIOR) tablet  1 Tablet Oral Q24H   . NS 250 mL flush bag   Intravenous Q15 Min PRN   . NS 250 mL flush bag   Intravenous Q15 Min PRN   . NS flush syringe  2-6 mL Intracatheter Q8HRS   . NS flush syringe  2-6 mL Intracatheter Q1 MIN PRN   . NS flush syringe  10-30 mL Intracatheter Q8HRS   . NS flush syringe  20-30 mL Intracatheter Q1 MIN PRN   . NS flush syringe  10-30 mL Intracatheter Q8HRS   . NS flush syringe  20-30 mL Intracatheter Q1 MIN PRN   . NS flush syringe  2-6 mL Intracatheter Q8HRS   . NS flush syringe  2-6 mL Intracatheter Q1 MIN PRN   . ondansetron (ZOFRAN) 2 mg/mL injection  4 mg Intravenous Q6H PRN   . oxyCODONE (ROXICODONE)  immediate release tablet  5 mg Oral Q4H PRN   . oxyCODONE (ROXICODONE) immediate releaste tablet  10 mg Oral Q4H PRN   . pantoprazole (PROTONIX) delayed release tablet  40 mg Oral Q12H   . sennosides-docusate sodium (SENOKOT-S) 8.6-'50mg'$  per tablet  1 Tablet Oral 2x/day   . SSIP insulin R human (HUMULIN R) 100 units/mL injection  0-12 Units Subcutaneous 4x/day PRN         Allene Pyo, PA-C  04/23/2021, 08:54

## 2021-04-24 ENCOUNTER — Inpatient Hospital Stay (HOSPITAL_COMMUNITY): Payer: 59

## 2021-04-24 DIAGNOSIS — R918 Other nonspecific abnormal finding of lung field: Secondary | ICD-10-CM

## 2021-04-24 DIAGNOSIS — Z95828 Presence of other vascular implants and grafts: Secondary | ICD-10-CM

## 2021-04-24 DIAGNOSIS — Z978 Presence of other specified devices: Secondary | ICD-10-CM

## 2021-04-24 DIAGNOSIS — J90 Pleural effusion, not elsewhere classified: Secondary | ICD-10-CM

## 2021-04-24 LAB — BASIC METABOLIC PANEL
ANION GAP: 8 mmol/L (ref 4–13)
ANION GAP: 6 mmol/L (ref 4–13)
BUN/CREA RATIO: 21 (ref 6–22)
BUN/CREA RATIO: 28 — ABNORMAL HIGH (ref 6–22)
BUN: 15 mg/dL (ref 8–25)
BUN: 19 mg/dL (ref 8–25)
CALCIUM: 6.8 mg/dL — ABNORMAL LOW (ref 8.8–10.2)
CALCIUM: 8.4 mg/dL — ABNORMAL LOW (ref 8.8–10.2)
CHLORIDE: 112 mmol/L — ABNORMAL HIGH (ref 96–111)
CHLORIDE: 105 mmol/L (ref 96–111)
CO2 TOTAL: 18 mmol/L — ABNORMAL LOW (ref 23–31)
CO2 TOTAL: 27 mmol/L (ref 23–31)
CREATININE: 0.7 mg/dL — ABNORMAL LOW (ref 0.75–1.35)
CREATININE: 0.68 mg/dL — ABNORMAL LOW (ref 0.75–1.35)
ESTIMATED GFR: >90 mL/min/BSA (ref >=60)
ESTIMATED GFR: >90 mL/min/BSA (ref >=60)
GLUCOSE: 704 mg/dL (ref 65–125)
GLUCOSE: 140 mg/dL — ABNORMAL HIGH (ref 65–125)
POTASSIUM: 3.7 mmol/L (ref 3.5–5.1)
POTASSIUM: 3.9 mmol/L (ref 3.5–5.1)
SODIUM: 138 mmol/L (ref 136–145)
SODIUM: 138 mmol/L (ref 136–145)

## 2021-04-24 LAB — CBC
HCT: 33.6 % — ABNORMAL LOW (ref 38.9–52.0)
HGB: 10.6 g/dL — ABNORMAL LOW (ref 13.4–17.5)
MCH: 28.8 pg (ref 26.0–32.0)
MCHC: 31.5 g/dL (ref 31.0–35.5)
MCV: 91.3 fL (ref 78.0–100.0)
MPV: 10.6 fL (ref 8.7–12.5)
PLATELETS: 353 10*3/uL (ref 150–400)
RBC: 3.68 10*6/uL — ABNORMAL LOW (ref 4.50–6.10)
RDW-CV: 18.1 % — ABNORMAL HIGH (ref 11.5–15.5)
WBC: 11.2 10*3/uL — ABNORMAL HIGH (ref 3.7–11.0)

## 2021-04-24 LAB — POC BLOOD GLUCOSE (RESULTS)
GLUCOSE, POC: 245 mg/dl — ABNORMAL HIGH (ref 70–105)
GLUCOSE, POC: 203 mg/dl — ABNORMAL HIGH (ref 70–105)
GLUCOSE, POC: 165 mg/dl — ABNORMAL HIGH (ref 70–105)

## 2021-04-24 LAB — URINALYSIS, MACROSCOPIC
BILIRUBIN: NEGATIVE mg/dL
BLOOD: NEGATIVE mg/dL
COLOR: NORMAL
GLUCOSE: 70 mg/dL — AB
KETONES: NEGATIVE mg/dL
LEUKOCYTES: NEGATIVE WBCs/uL
NITRITE: NEGATIVE
PH: 6 (ref 5.0–8.0)
PROTEIN: 30 mg/dL — AB
SPECIFIC GRAVITY: 1.012 (ref 1.005–1.030)
UROBILINOGEN: NEGATIVE mg/dL

## 2021-04-24 LAB — MAGNESIUM
MAGNESIUM: 2.1 mg/dL (ref 1.8–2.6)
MAGNESIUM: 2.1 mg/dL (ref 1.8–2.6)

## 2021-04-24 LAB — URINALYSIS, MICROSCOPIC
GRANULAR CASTS: 1 /lpf — ABNORMAL HIGH (ref <=0)
RBCS: 0 /hpf (ref <6.0)
WBCS: 0 /hpf (ref <4.0)

## 2021-04-24 LAB — PTT (PARTIAL THROMBOPLASTIN TIME)
APTT: 40.6 seconds — ABNORMAL HIGH (ref 24.2–37.5)
APTT: 33 seconds (ref 24.2–37.5)
APTT: 59.7 seconds — ABNORMAL HIGH (ref 24.2–37.5)

## 2021-04-24 LAB — PHOSPHORUS
PHOSPHORUS: 6 mg/dL — ABNORMAL HIGH (ref 2.3–4.0)
PHOSPHORUS: 2.4 mg/dL (ref 2.3–4.0)

## 2021-04-24 LAB — TRIGLYCERIDE: TRIGLYCERIDES: 95 mg/dL (ref <150)

## 2021-04-24 LAB — ADULT ROUTINE BLOOD CULTURE, SET OF 2 BOTTLES (BACTERIA AND YEAST)
BLOOD CULTURE, ROUTINE: NO GROWTH
BLOOD CULTURE, ROUTINE: NO GROWTH

## 2021-04-24 MED ORDER — DEXTROSE 5 % AND 0.45 % SODIUM CHLORIDE INTRAVENOUS SOLUTION
INTRAVENOUS | Status: DC
Start: 2021-04-24 — End: 2021-04-24

## 2021-04-24 MED ORDER — ELECTROLYTE-A INTRAVENOUS SOLUTION
INTRAVENOUS | Status: DC
Start: 2021-04-24 — End: 2021-04-24
  Administered 2021-04-24: 0 mL via INTRAVENOUS

## 2021-04-24 MED ORDER — PROCHLORPERAZINE EDISYLATE 10 MG/2 ML (5 MG/ML) INJECTION SOLUTION
10.0000 mg | Freq: Four times a day (QID) | INTRAMUSCULAR | Status: DC | PRN
Start: 2021-04-24 — End: 2021-05-15
  Administered 2021-04-24 (×2): 10 mg via INTRAVENOUS
  Administered 2021-04-25: 0 mg via INTRAVENOUS
  Administered 2021-04-25 – 2021-05-15 (×25): 10 mg via INTRAVENOUS
  Filled 2021-04-24 (×27): qty 2

## 2021-04-24 MED ORDER — ELECTROLYTE-A INTRAVENOUS SOLUTION
INTRAVENOUS | Status: DC
Start: 2021-04-24 — End: 2021-04-25
  Administered 2021-04-25: 0 mL via INTRAVENOUS

## 2021-04-24 MED ORDER — WATER FOR INJECTION, STERILE INTRAVENOUS SOLUTION
INTRAVENOUS | Status: AC
Start: 2021-04-24 — End: 2021-04-25
  Filled 2021-04-24: qty 1100

## 2021-04-24 MED ORDER — ELECTROLYTE-A INTRAVENOUS SOLUTION BOLUS
500.0000 mL | Freq: Once | INTRAVENOUS | Status: AC
Start: 2021-04-24 — End: 2021-04-24
  Administered 2021-04-24: 500 mL via INTRAVENOUS
  Administered 2021-04-24: 0 mL via INTRAVENOUS

## 2021-04-24 NOTE — Progress Notes (Signed)
[facility]  Surgical Oncology  Progress Note      Basel, Defalco, 76 y.o. male  Date of Birth:  19-Oct-1945  Date of Admission:  04/03/2021  Date of service: 04/24/2021    Assessment:  This is a 76 y.o. 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 He underwent massive transfusion protocol and ex lap on 04/03/21 at an outside facility with ligation of a bleeding vessel in the liver bed. Patient was subsequently transferred to [facility] postoperaively, intubated. Patient was subsequently extubated later that day on 3/4. 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      Plan/Recommendations:  - Diet: decreased to CLD, TPN  - Restarted fluid  - Tachycardia   - Repeat blood cultures, UA and chest x-ray  - Peripancreatic fluid collections   - GI scope on 3/23 unable to stent PD duct, patient with pancreatic divisim              - IR consulted; RUQ drain placement and empiric embolization 3/10              - Blood cultures 3/9: + pseudomonas    - Cultures form R JP: Pseudomonas and enterococcus              - Repeat Blood cultures 3/12 negative   - ID consulted; continue Daptomycin and meropenum (stop date 4/7)  - PE   - s/p IVC filter placement 3/17   - Aggressive pulm toilet, supp off O2   - Resume low dose heparin  - IVF: Restarted at 125  - Midline fistula with ostomy bag added to monitor output  - OOB TID  - PT/OT ordered    Subjective:   Nausea and some small amount of vomiting overnight. Pt reports some abdominal soreness due retching.     Objective  Filed Vitals:    04/23/21 2153 04/24/21 0214 04/24/21 0230 04/24/21 0720   BP:   (!) 142/81    Pulse:   (!) 107    Resp: '19 18 15    '$ Temp:   36.6 C (97.8 F) (!) 38.3 C (101 F)   SpO2:   92%      Physical Exam:   GEN:  AOx4, resting in bed, no acute distress  PULM: Normal  respiratory effort.   CV:  Tachycardic, regular rhythm   ABD:   Abdomen soft, minimal ttp RUQ, nondistended. JP in place in RUQ with minimal output,  Lower midline incision with feculent drainage present with ostomy bag over it.  Pigtail catheter present with SS output.  MS: Atraumatic. Moves all extremities.  NEURO:   Alert and oriented to person, place and time.  Cranial nerves grossly intact.    Integumentary:  Pink, warm, and dry  PSYCHOSOCIAL: Pleasant.  Normal affect.     Labs  Results for orders placed or performed during the hospital encounter of 04/03/21 (from the past 24 hour(s))   PTT (PARTIAL THROMBOPLASTIN TIME)   Result Value Ref Range    APTT 30.7 24.2 - 37.5 seconds    Narrative    Therapeutic range for unfractionated heparin is 60-100 seconds.   PTT (PARTIAL THROMBOPLASTIN TIME)   Result Value Ref Range    APTT 37.0 24.2 - 37.5 seconds    Narrative    Therapeutic range  for unfractionated heparin is 60-100 seconds.   PTT (PARTIAL THROMBOPLASTIN TIME)   Result Value Ref Range    APTT 40.6 (H) 24.2 - 37.5 seconds    Narrative    Therapeutic range for unfractionated heparin is 60-100 seconds.   BASIC METABOLIC PANEL   Result Value Ref Range    SODIUM 138 136 - 145 mmol/L    POTASSIUM 3.7 3.5 - 5.1 mmol/L    CHLORIDE 112 (H) 96 - 111 mmol/L    CO2 TOTAL 18 (L) 23 - 31 mmol/L    ANION GAP 8 4 - 13 mmol/L    CALCIUM 6.8 (L) 8.8 - 10.2 mg/dL    GLUCOSE 704 (HH) 65 - 125 mg/dL    BUN 15 8 - 25 mg/dL    CREATININE 0.70 (L) 0.75 - 1.35 mg/dL    BUN/CREA RATIO 21 6 - 22    ESTIMATED GFR >90 >=60 mL/min/BSA    Narrative    Lipemia alters results at this level (gross). Repeat testing with new specimen suggested.  Lipemia alters results at this level (gross). Repeat testing with new specimen suggested.  Grossly lipemic  Grossly lipemic  Grossly lipemic  Lipemia alters results at this level (gross). Repeat testing with new specimen suggested.  Hemolysis can alter results at this level (slight).  Lipemia alters  results at this level (gross). Repeat testing with new specimen suggested.  Lipemia alters results at this level (gross). Repeat testing with new specimen suggested.   CBC   Result Value Ref Range    WBC 11.2 (H) 3.7 - 11.0 x10^3/uL    RBC 3.68 (L) 4.50 - 6.10 x10^6/uL    HGB 10.6 (L) 13.4 - 17.5 g/dL    HCT 33.6 (L) 38.9 - 52.0 %    MCV 91.3 78.0 - 100.0 fL    MCH 28.8 26.0 - 32.0 pg    MCHC 31.5 31.0 - 35.5 g/dL    RDW-CV 18.1 (H) 11.5 - 15.5 %    PLATELETS 353 150 - 400 x10^3/uL    MPV 10.6 8.7 - 12.5 fL   MAGNESIUM   Result Value Ref Range    MAGNESIUM 2.1 1.8 - 2.6 mg/dL    Narrative    Hemolysis can alter results at this level (slight).  Lipemia alters results at this level (gross). Repeat testing with new specimen suggested.   PHOSPHORUS   Result Value Ref Range    PHOSPHORUS 6.0 (H) 2.3 - 4.0 mg/dL    Narrative    Hemolysis can alter results at this level (slight).  Grossly lipemic   BASIC METABOLIC PANEL   Result Value Ref Range    SODIUM 138 136 - 145 mmol/L    POTASSIUM 3.9 3.5 - 5.1 mmol/L    CHLORIDE 105 96 - 111 mmol/L    CO2 TOTAL 27 23 - 31 mmol/L    ANION GAP 6 4 - 13 mmol/L    CALCIUM 8.4 (L) 8.8 - 10.2 mg/dL    GLUCOSE 140 (H) 65 - 125 mg/dL    BUN 19 8 - 25 mg/dL    CREATININE 0.68 (L) 0.75 - 1.35 mg/dL    BUN/CREA RATIO 28 (H) 6 - 22    ESTIMATED GFR >90 >=60 mL/min/BSA   PHOSPHORUS   Result Value Ref Range    PHOSPHORUS 2.4 2.3 - 4.0 mg/dL   MAGNESIUM   Result Value Ref Range    MAGNESIUM 2.1 1.8 - 2.6 mg/dL   URINALYSIS, MACROSCOPIC AND MICROSCOPIC W/CULTURE  REFLEX    Specimen: Urine, Clean Catch    Narrative    The following orders were created for panel order URINALYSIS, MACROSCOPIC AND MICROSCOPIC W/CULTURE REFLEX.  Procedure                               Abnormality         Status                     ---------                               -----------         ------                     URINALYSIS, MACROSCOPIC[[id number]]                                                     URINALYSIS,  MICROSCOPIC[[id number]]                                                       Please view results for these tests on the individual orders.   POC BLOOD GLUCOSE (RESULTS)   Result Value Ref Range    GLUCOSE, POC 179 (H) 70 - 105 mg/dl   POC BLOOD GLUCOSE (RESULTS)   Result Value Ref Range    GLUCOSE, POC 162 (H) 70 - 105 mg/dl   POC BLOOD GLUCOSE (RESULTS)   Result Value Ref Range    GLUCOSE, POC 197 (H) 70 - 105 mg/dl   POC BLOOD GLUCOSE (RESULTS)   Result Value Ref Range    GLUCOSE, POC 204 (H) 70 - 105 mg/dl       I/O:  Date 04/23/21 0700 - 04/24/21 0659 04/24/21 0700 - 04/25/21 0659   Shift 0700-1459 1500-2259 2300-0659 24 Hour Total 0700-1459 1500-2259 2300-0659 24 Hour Total   INTAKE   P.O.  237  237         Supplement Volume  237  237       I.V.(mL/kg/hr)  1848.8(2.91) 103.6(0.16) 1952.4(1.02)         Heparin Volume  48.8 103.6 152.4         Volume Infused (adult custom parenteral nutrition)  1800  1800       IV Piggyback  222  222         Volume (sodium phosphate 30 mmol in D5W 100 mL IVPB)  110  110         Volume (sodium phosphate 30 mmol in D5W 100 mL IVPB)  112  112       Shift Total(mL/kg)  2307.8(29.03) 103.6(1.3) 2411.4(30.33)       OUTPUT   Urine(mL/kg/hr) 500(0.74) 1150(1.81) 400(0.63) 2050(1.07) 200   200     Urine (Voided) 500 1150 400 2050 200   200   Emesis             Emesis Occurrence 0 x   0 x       Drains 50 [phone]  JP Drain Output Aeronautical engineer Drain Right;Upper Abdomen)  0 0 0         Drain Output (Drain (Miscellaneous) #52F pigtail catheter for RLQ fluid collection Lower;Right;Anterior Abdomen)  590 320 910         Drain Output (Drain (Miscellaneous) peds ostomy bag Lower;Medial Abdomen) 50 50 60 160       Other             Other 0 x   0 x       Stool             Stool Occurrence 1 x   1 x       Shift Total(mL/kg) 550(6.52) 8657(84.69) 780(9.81) 6295(28.41) 200(2.52)   200(2.52)   Weight (kg) 84.4 79.5 79.5 79.5 79.5 79.5 79.5 79.5       Radiology  Results for orders placed or  performed during the hospital encounter of 04/03/21 (from the past 72 hour(s))   EUS PROCEDURE     Status: None    Narrative    *Procedure not read by radiology.    *Please Refer to Procedure Note for result.   FLUORO ERCP     Status: None    Narrative    *Procedure not read by radiology.    *Please Refer to Procedure Note for result.       Current Medications:  Current Facility-Administered Medications   Medication Dose Route Frequency   . acetaminophen (TYLENOL) tablet  650 mg Oral Q4H   . adult custom parenteral nutrition   Intravenous Continuous   . atorvastatin (LIPITOR) tablet  40 mg Oral Daily with Breakfast   . benzonatate (TESSALON) capsule  100 mg Oral Q8H PRN   . D5W 250 mL flush bag   Intravenous Q15 Min PRN   . DAPTOmycin (CUBICIN) 800 mg in NS 50 mL IVPB  10 mg/kg (Adjusted) Intravenous Q24H   . docusate sodium (COLACE) capsule  100 mg Oral 2x/day   . electrolyte-A (PLASMALYTE-A) bolus infusion 500 mL  500 mL Intravenous Once   . electrolyte-A (PLASMALYTE-A) premix infusion   Intravenous Continuous   . elvitegravir-cobicistat-emtricitrabine-tenofovir (GENVOYA) 150-150-200-10 mg per tablet  1 Tablet Oral Daily   . gabapentin (NEURONTIN) capsule  300 mg Oral 3x/day   . heparin 25,000 units in NS 250 mL infusion  12 Units/kg/hr (Adjusted) Intravenous Continuous   . HYDROmorphone (DILAUDID) 1 mg/mL injection  0.2 mg Intravenous Q3H PRN   . iopamidol (ISOVUE-300) 61% infusion  100 mL Intravenous Give in GI   . meropenem (MERREM) 1 g in NS 100 mL IVPB  1 g Intravenous Q8H   . methocarbamol (ROBAXIN) tablet  500 mg Oral 4x/day   . multivitamin-minerals-folic acid-lycopene-lutein (CERTAVITE SENIOR) tablet  1 Tablet Oral Q24H   . NS 250 mL flush bag   Intravenous Q15 Min PRN   . NS flush syringe  2-6 mL Intracatheter Q8HRS   . NS flush syringe  2-6 mL Intracatheter Q1 MIN PRN   . NS flush syringe  10-30 mL Intracatheter Q8HRS   . NS flush syringe  20-30 mL Intracatheter Q1 MIN PRN   . NS flush syringe  10-30 mL  Intracatheter Q8HRS   . NS flush syringe  20-30 mL Intracatheter Q1 MIN PRN   . ondansetron (ZOFRAN) 2 mg/mL injection  4 mg Intravenous Q6H PRN   . oxyCODONE (ROXICODONE) immediate release tablet  5 mg Oral Q4H PRN   . oxyCODONE (ROXICODONE) immediate releaste tablet  10 mg  Oral Q4H PRN   . pantoprazole (PROTONIX) delayed release tablet  40 mg Oral Q12H   . prochlorperazine (COMPAZINE) 5 mg/mL injection  10 mg Intravenous Q6H PRN   . sennosides-docusate sodium (SENOKOT-S) 8.6-'50mg'$  per tablet  1 Tablet Oral 2x/day   . SSIP insulin R human (HUMULIN R) 100 units/mL injection  0-12 Units Subcutaneous 4x/day PRN         [name], MD, 08:40  04/24/2021  PGY-2 General Surgery

## 2021-04-24 NOTE — Nurses Notes (Signed)
Paged service about getting compazine on board to help with N/V

## 2021-04-24 NOTE — Care Plan (Signed)
[place]  Occupational Therapy Progress Note    Patient Name: [name]  Date of Birth: [birth year]  Height:  175.3 cm (5' 9.02")  Weight:  79.5 kg (175 lb 4.3 oz)  Room/Bed: 14/A  Payor: MEDICARE / Plan: MEDICARE PART A AND B / Product Type: Medicare /     Assessment:    OT tx at b/s on 8NE.  Spouse present and supportive.  Pt well motivated to participate. Pt now able to incorporate FWW into transfer training and today was able to initiate functional ambulation with FWW.  Pt requires frequent rest breaks between actions, however, these rest breaks enable to the pt participate at an optimal level.  Continue to recommend rehab disposition once medically stable to facilitate d/c home with support of spouse.      Discharge Needs:   Equipment Recommendation: to be determined    Discharge Disposition:  inpatient rehabilitation facility     JUSTIFICATION OF DISCHARGE RECOMMENDATION   Based on current diagnosis, functional performance prior to admission, and current functional performance, this patient requires continued OT services in inpatient rehabilitation facility in order to achieve significant functional improvements.    Plan:   Continue to follow patient according to established plan of care.  The risks/benefits of therapy have been discussed with the patient/caregiver and he/she is in agreement with the established plan of care.     Subjective & Objective:      04/23/21 1451   Therapist Pager   OT Assigned/ Pager # Leylany Nored [phone]   Rehab Session   Document Type therapy progress note (daily note)   Total OT Minutes: 28   Patient Effort good   Symptoms Noted During/After Treatment fatigue;dizziness   General Information   Patient Profile Reviewed yes   Medical Lines Peripheral Drain;PIV Line;Telemetry   Respiratory Status room air   Existing Precautions/Restrictions full code;fall precautions;contact isolation   Pre Treatment Status   Pre Treatment Patient Status  Patient supine in bed;Call light within reach;Telephone within reach;Sitter select activated;Nurse approved session   Support Present Pre Treatment  Family present   Communication Pre Treatment  Nurse   Mutuality/Individual Preferences   Individualized Care Needs OOB with [place] with assist of 2 or Denna Haggard; encourage participation in adl routine with assist   Plan of Care Reviewed With patient;spouse   Vital Signs   Vitals Comment VSS   Pain Assessment   Pre/Posttreatment Pain Comment no c/o pain   Coping/Psychosocial   Observed Emotional State calm;cooperative  (mildly anxious)   Verbalized Emotional State acceptance   Family/Support System   Family/Support Persons spouse   Involvement in Care at bedside;attentive to patient;interacting with patient;participating in care;supportive of patient   Coping/Psychosocial Response Interventions   Plan Of Care Reviewed With patient;spouse   Trust Relationship/Rapport care explained;choices provided;questions encouraged;questions answered;reassurance provided;thoughts/feelings acknowledged   Cognitive Assessment/Interventions   Behavior/Mood Observations behavior appropriate to situation, WNL/WFL   Orientation Status oriented x 4   Attention WNL/WFL   Follows Commands WNL   Bed Mobility Assessment/Treatment   Bed Mobility, Assistive Device bed rails;Head of Bed Elevated   Supine-Sit Independence moderate assist (50% patient effort);verbal cues required   Roll Left Independence minimum assist (75% patient effort);verbal cues required   Transfer Assessment/Treatment   Sit-Stand Independence moderate assist (50% patient effort);2 person assist required;verbal cues required   Stand-Sit Independence moderate assist (50% patient effort);2 person assist required;verbal cues required   Sit-Stand-Sit, Assist Device walker, front wheeled  Bed-Chair Independence minimum assist (75% patient effort);moderate assist (50% patient effort);2 person assist required;verbal cues required    Bed-Chair-Bed Assist Device walker, front wheeled   Transfer Comment requires modA for the sit to stand portion, but once up is able to complete the pivot portion with minA of 2   Gait Assessment/Treatment   Independence  minimum assist (75% patient effort);2 person assist required;verbal cues required   Total Distance Ambulated 5   Assistive Device  walker, front wheeled   Gait Speed decreased   ADL Assessment/Intervention   ADL Comments emphasis of treatment interventions has been on mobilizing pt to edge of bed and out of bed to facilitate adl performance.  As pt's activity tolerance improves plan to further engage pt in adl routine.  He does not have the endurance to do both at this time and requires assist of 2 for mobilization.   Lower Body Dressing Assessment/Training   Independence Level  dependent (less than 25% patient effort)   Comment slipper socks   Self-Feeding Assessment/Training   Position  sitting   Independence Level set up required   Comment drinks from cup without difficulty   Balance Skill Training   Comment with FWW   Sitting Balance: Static fair balance   Sitting, Dynamic (Balance) fair - balance   Sit-to-Stand Balance fair - balance   Standing Balance: Static fair - balance   Standing Balance: Dynamic fair - balance   Therapeutic Exercise/Activity   Comment assisted pt to transition to edge of bed, seated rest required, completed sit to stand and 30 sec static standing prior to pivot to chair, seated rest required, then additional sit to stand and short distance ambulation with FWW   Post Treatment Status   Post Treatment Patient Status Patient sitting in bedside chair or w/c;Call light within reach;Telephone within reach   Support Present Post Treatment  Family present   Communication Post Treatement Nurse   Clinical Impression   Functional Level at Time of Session OT tx at b/s on 8NE.  Spouse present and supportive.  Pt well motivated to participate. Pt now able to incorporate FWW into  transfer training and today was able to initiate functional ambulation with FWW.  Pt requires frequent rest breaks between actions, however, these rest breaks enable to the pt participate at an optimal level.  Continue to recommend rehab disposition once medically stable to facilitate d/c home with support of spouse.   Anticipated Equipment Needs at Discharge to be determined   Anticipated Discharge Disposition inpatient rehabilitation facility   Highest level of Mobility score   Exercise/Activity Level Performed 6- Walked 10 steps or more     Therapist:   Forbes Cellar, OT  Pager #: [phone]

## 2021-04-24 NOTE — Care Plan (Signed)
Plan of care reviewed with patient. Pt has been nauseous off and on all night. Heparin was increased by 2 units/kg/hr at 0226        Problem: Adult Inpatient Plan of Care  Goal: Plan of Care Review  Outcome: Ongoing (see interventions/notes)  Goal: Patient-Specific Goal (Individualized)  Outcome: Ongoing (see interventions/notes)  Flowsheets (Taken 04/23/2021 2000)  Individualized Care Needs: nausea control  Anxieties, Fears or Concerns: just not feeling well  Goal: Absence of Hospital-Acquired Illness or Injury  Outcome: Ongoing (see interventions/notes)  Intervention: Identify and Manage Fall Risk  Recent Flowsheet Documentation  Taken 04/24/2021 0214 by Raquel Isami, RN  Safety Promotion/Fall Prevention:   activity supervised   fall prevention program maintained   nonskid shoes/slippers when out of bed   safety round/check completed  Taken 04/24/2021 0030 by Raquel Montrice, RN  Safety Promotion/Fall Prevention:   fall prevention program maintained   muscle strengthening facilitated   nonskid shoes/slippers when out of bed   safety round/check completed   activity supervised  Intervention: Prevent Skin Injury  Recent Flowsheet Documentation  Taken 04/23/2021 2000 by Raquel Harbor, RN  Body Position: fowlers ( 45-60 degrees)  Skin Protection: transparent dressing maintained  Intervention: Prevent and Manage VTE (Venous Thromboembolism) Risk  Recent Flowsheet Documentation  Taken 04/23/2021 2000 by Raquel Salomon, RN  VTE Prevention/Management:   ambulation promoted   anticoagulant therapy adjusted  Goal: Optimal Comfort and Wellbeing  Outcome: Ongoing (see interventions/notes)  Intervention: Provide Person-Centered Care  Recent Flowsheet Documentation  Taken 04/23/2021 2000 by Raquel Marquelle, [place] Relationship/Rapport:   care explained   choices provided   emotional support provided   empathic listening provided   questions answered   questions encouraged   reassurance provided   thoughts/feelings acknowledged  Goal:  Rounds/Family Conference  Outcome: Ongoing (see interventions/notes)     Problem: Fall Injury Risk  Goal: Absence of Fall and Fall-Related Injury  Outcome: Ongoing (see interventions/notes)  Intervention: Promote Injury-Free Environment  Recent Flowsheet Documentation  Taken 04/24/2021 0214 by Raquel Lejuan, RN  Safety Promotion/Fall Prevention:   activity supervised   fall prevention program maintained   nonskid shoes/slippers when out of bed   safety round/check completed  Taken 04/24/2021 0030 by Raquel Priyansh, RN  Safety Promotion/Fall Prevention:   fall prevention program maintained   muscle strengthening facilitated   nonskid shoes/slippers when out of bed   safety round/check completed   activity supervised     Problem: Skin Injury Risk Increased  Goal: Skin Health and Integrity  Outcome: Ongoing (see interventions/notes)  Intervention: Optimize Skin Protection  Recent Flowsheet Documentation  Taken 04/23/2021 2000 by Raquel Tramond, RN  Pressure Reduction Techniques:   (L,M,H,VH)Frequent Turning   frequent weight shift encouraged  Pressure Reduction Devices:   (L.M,H,VH) Pressure redistributing mattress utilized   (M,H,VH) Use Repositioning Devices or Pillows  Skin Protection: transparent dressing maintained  Activity Management:   activity adjusted per tolerance   activity clustered for rest periods   activity encouraged  Head of Bed (HOB) Positioning: HOB at 45 degrees     Problem: Parenteral Nutrition  Goal: Effective Intravenous Nutrition Therapy Delivery  Outcome: Ongoing (see interventions/notes)  Intervention: Optimize Intravenous Nutrition Delivery  Recent Flowsheet Documentation  Taken 04/23/2021 2000 by Raquel Mohammed, RN  Nutrition Support Management: parenteral nutrition continued  Intervention: Optimize Nutrition, Fluid and Electrolyte Intake  Recent Flowsheet Documentation  Taken 04/23/2021 2000 by Raquel Maksym, RN  Glycemic Management: blood glucose monitored     Problem:  Wound Healing  Progression  Goal: Optimal Wound Healing  Outcome: Ongoing (see interventions/notes)  Intervention: Promote Wound Healing  Recent Flowsheet Documentation  Taken 04/23/2021 2000 by Raquel Rollins, RN  Pressure Reduction Techniques:   (L,M,H,VH)Frequent Turning   frequent weight shift encouraged  Pressure Reduction Devices:   (L.M,H,VH) Pressure redistributing mattress utilized   (M,H,VH) Use Repositioning Devices or Pillows  Activity Management:   activity adjusted per tolerance   activity clustered for rest periods   activity encouraged

## 2021-04-25 ENCOUNTER — Inpatient Hospital Stay (HOSPITAL_COMMUNITY): Payer: 59

## 2021-04-25 DIAGNOSIS — R6889 Other general symptoms and signs: Secondary | ICD-10-CM

## 2021-04-25 DIAGNOSIS — Z9889 Other specified postprocedural states: Secondary | ICD-10-CM

## 2021-04-25 DIAGNOSIS — K5730 Diverticulosis of large intestine without perforation or abscess without bleeding: Secondary | ICD-10-CM

## 2021-04-25 DIAGNOSIS — N281 Cyst of kidney, acquired: Secondary | ICD-10-CM

## 2021-04-25 LAB — CBC
HCT: 23.7 % — ABNORMAL LOW (ref 38.9–52.0)
HGB: 7.2 g/dL — ABNORMAL LOW (ref 13.4–17.5)
MCH: 28.5 pg (ref 26.0–32.0)
MCHC: 30.4 g/dL — ABNORMAL LOW (ref 31.0–35.5)
MCV: 93.7 fL (ref 78.0–100.0)
MPV: 10.8 fL (ref 8.7–12.5)
PLATELETS: 378 10*3/uL (ref 150–400)
RBC: 2.53 10*6/uL — ABNORMAL LOW (ref 4.50–6.10)
RDW-CV: 17.8 % — ABNORMAL HIGH (ref 11.5–15.5)
WBC: 12.4 10*3/uL — ABNORMAL HIGH (ref 3.7–11.0)

## 2021-04-25 LAB — BASIC METABOLIC PANEL
ANION GAP: 5 mmol/L (ref 4–13)
BUN/CREA RATIO: 33 — ABNORMAL HIGH (ref 6–22)
BUN: 19 mg/dL (ref 8–25)
CALCIUM: 8.1 mg/dL — ABNORMAL LOW (ref 8.8–10.2)
CHLORIDE: 104 mmol/L (ref 96–111)
CO2 TOTAL: 28 mmol/L (ref 23–31)
CREATININE: 0.58 mg/dL — ABNORMAL LOW (ref 0.75–1.35)
ESTIMATED GFR: >90 mL/min/BSA (ref >=60)
GLUCOSE: 149 mg/dL — ABNORMAL HIGH (ref 65–125)
POTASSIUM: 3.7 mmol/L (ref 3.5–5.1)
SODIUM: 137 mmol/L (ref 136–145)

## 2021-04-25 LAB — POC BLOOD GLUCOSE (RESULTS)
GLUCOSE, POC: 200 mg/dl — ABNORMAL HIGH (ref 70–105)
GLUCOSE, POC: 132 mg/dl — ABNORMAL HIGH (ref 70–105)
GLUCOSE, POC: 136 mg/dl — ABNORMAL HIGH (ref 70–105)
GLUCOSE, POC: 115 mg/dl — ABNORMAL HIGH (ref 70–105)

## 2021-04-25 LAB — PTT (PARTIAL THROMBOPLASTIN TIME)
APTT: 76.9 seconds — ABNORMAL HIGH (ref 24.2–37.5)
APTT: 68.3 seconds — ABNORMAL HIGH (ref 24.2–37.5)
APTT: 68.4 seconds — ABNORMAL HIGH (ref 24.2–37.5)
APTT: 70.3 seconds — ABNORMAL HIGH (ref 24.2–37.5)

## 2021-04-25 LAB — MAGNESIUM: MAGNESIUM: 2.2 mg/dL (ref 1.8–2.6)

## 2021-04-25 LAB — H & H
HCT: 24.7 % — ABNORMAL LOW (ref 38.9–52.0)
HGB: 7.5 g/dL — ABNORMAL LOW (ref 13.4–17.5)

## 2021-04-25 LAB — PHOSPHORUS: PHOSPHORUS: 1.9 mg/dL — ABNORMAL LOW (ref 2.3–4.0)

## 2021-04-25 MED ORDER — ELECTROLYTE-A INTRAVENOUS SOLUTION
INTRAVENOUS | Status: DC
Start: 2021-04-25 — End: 2021-04-26
  Administered 2021-04-26: 0 mL via INTRAVENOUS

## 2021-04-25 MED ORDER — IOPAMIDOL 370 MG IODINE/ML (76 %) INTRAVENOUS SOLUTION
85.0000 mL | INTRAVENOUS | Status: AC
Start: 2021-04-25 — End: 2021-04-25
  Administered 2021-04-25: 85 mL via INTRAVENOUS

## 2021-04-25 MED ORDER — WATER FOR INJECTION, STERILE INTRAVENOUS SOLUTION
INTRAVENOUS | Status: AC
Start: 2021-04-25 — End: 2021-04-26
  Filled 2021-04-25: qty 1100

## 2021-04-25 MED ORDER — ELECTROLYTE-A INTRAVENOUS SOLUTION BOLUS
500.0000 mL | Freq: Once | INTRAVENOUS | Status: AC
Start: 2021-04-25 — End: 2021-04-25
  Administered 2021-04-25: 0 mL via INTRAVENOUS
  Administered 2021-04-25: 500 mL via INTRAVENOUS

## 2021-04-25 MED ORDER — POTASSIUM PHOSPHATES-MBASIC AND DIBASIC 3 MMOL/ML INTRAVENOUS SOLUTION
30.0000 mmol | Freq: Once | INTRAVENOUS | Status: DC
Start: 2021-04-25 — End: 2021-04-25
  Filled 2021-04-25: qty 10

## 2021-04-25 MED ORDER — MICAFUNGIN 100 MG INTRAVENOUS SOLUTION
100.0000 mg | INTRAVENOUS | Status: DC
Start: 2021-04-25 — End: 2021-04-27
  Administered 2021-04-25: 100 mg via INTRAVENOUS
  Administered 2021-04-25 – 2021-04-26 (×2): 0 mg via INTRAVENOUS
  Administered 2021-04-26: 100 mg via INTRAVENOUS
  Administered 2021-04-27: 0 mg via INTRAVENOUS
  Administered 2021-04-27: 100 mg via INTRAVENOUS
  Filled 2021-04-25 (×3): qty 5

## 2021-04-25 NOTE — Nurses Notes (Signed)
Latest Reference Range & Units 04/25/21 01:15   aPTT 24.2 - 37.5 seconds 76.9 (H)     Per protocol, heparin gtt turned down 1u/kg/hr. Gtt now @ 21u/kg/hr. Next aptt ordered for 0745.

## 2021-04-25 NOTE — Progress Notes (Signed)
[facility]  Surgical Oncology  Progress Note      [name], [name], 76 y.o. male  Date of Birth:  03-08-1945  Date of Admission:  04/03/2021  Date of service: 04/25/2021    Assessment:  This is a 76 y.o. 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 He underwent massive transfusion protocol and ex lap on 04/03/21 at an outside facility with ligation of a bleeding vessel in the liver bed. Patient was subsequently transferred to [facility] postoperaively, intubated. Patient was subsequently extubated later that day on 3/4. 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      Plan/Recommendations:  - Diet: decreased to CLD, TPN  - Restarted fluid  - Repeat blood cultures 3/25 showed yeast, will start micafungin  - Ordered CT abdomen/pelvis w/ IV contrast  - Peripancreatic fluid collections   - GI scope on 3/23 unable to stent PD duct, patient with pancreatic divisim              - IR consulted; RUQ drain placement and empiric embolization 3/10              - Blood cultures 3/9: + pseudomonas    - Cultures form R JP: Pseudomonas and enterococcus              - Repeat Blood cultures 3/12 negative   - ID consulted; continue Daptomycin and meropenum (stop date 4/7)  - PE   - s/p IVC filter placement 3/17   - Aggressive pulm toilet, supp off O2   - Resume low dose heparin  - IVF: Plasmalyte 48m/hr  - Midline fistula with ostomy bag added to monitor output  - OOB TID  - PT/OT ordered    Subjective:   Nausea on awakening this morning. Difficulty keeping medications down. Some abdominal soreness. Passing flatus.    Objective  Filed Vitals:    04/25/21 0153 04/25/21 0316 04/25/21 0353 04/25/21 0453   BP: 123/65 130/67     Pulse: (!) 109 (!) 111     Resp: '18 12  16   '$ Temp:   36.9 C (98.5 F)    SpO2: 94% 94%       Physical Exam:   GEN:   AOx4, resting in bed, no acute distress  PULM: Normal respiratory effort.   CV:  Tachycardic, regular rhythm   ABD:   Abdomen soft, minimal ttp RUQ, nondistended. JP in place in RUQ,  Lower midline incision with feculent drainage present with ostomy bag over it.  Pigtail catheter present with SS output.  MS: Atraumatic. Moves all extremities.  NEURO:   Alert and oriented to person, place and time.  Cranial nerves grossly intact.    Integumentary:  Pink, warm, and dry  PSYCHOSOCIAL: Pleasant.  Normal affect.     Labs  Results for orders placed or performed during the hospital encounter of 04/03/21 (from the past 24 hour(s))   ADULT ROUTINE BLOOD CULTURE, SET OF 2 ADULT BOTTLES (BACTERIA AND YEAST)    Specimen: Blood   Result Value Ref Range    BLOOD CULTURE, ROUTINE Abnormal Stain (AA)     GRAM STAIN Yeast (A)    PTT (PARTIAL THROMBOPLASTIN TIME) - ONCE   Result Value Ref Range    APTT 33.0 24.2 - 37.5 seconds    Narrative  Therapeutic range for unfractionated heparin is 60-100 seconds.   URINALYSIS, MACROSCOPIC AND MICROSCOPIC W/CULTURE REFLEX    Specimen: Urine, Clean Catch    Narrative    The following orders were created for panel order URINALYSIS, MACROSCOPIC AND MICROSCOPIC W/CULTURE REFLEX.  Procedure                               Abnormality         Status                     ---------                               -----------         ------                     URINALYSIS, MACROSCOPIC[[id number]]      Abnormal            Final result               URINALYSIS, MICROSCOPIC[[id number]]      Abnormal            Final result                 Please view results for these tests on the individual orders.   URINALYSIS, MACROSCOPIC   Result Value Ref Range    SPECIFIC GRAVITY 1.012 1.005 - 1.030    GLUCOSE 70 (A) Negative mg/dL    PROTEIN 30 (A) Negative mg/dL    BILIRUBIN Negative Negative mg/dL    UROBILINOGEN Negative Negative mg/dL    PH 6.0 5.0 - 8.0    BLOOD Negative Negative mg/dL    KETONES Negative Negative  mg/dL    NITRITE Negative Negative    LEUKOCYTES Negative Negative WBCs/uL    APPEARANCE Clear Clear    COLOR Normal (Yellow) Normal (Yellow)   URINALYSIS, MICROSCOPIC   Result Value Ref Range    WBCS 0.0 <4.0 /hpf    RBCS 0.0 <6.0 /hpf    BACTERIA Occasional or less Occasional or less /hpf    YEAST Many (A) Occasional or less /hpf    GRANULAR CASTS 1 (H) <=0 /lpf    SQUAMOUS EPITHELIAL CELLS Occasional or less Occasional or less /lpf    MUCOUS Light Light /lpf   PTT (PARTIAL THROMBOPLASTIN TIME)   Result Value Ref Range    APTT 59.7 (H) 24.2 - 37.5 seconds    Narrative    Therapeutic range for unfractionated heparin is 60-100 seconds.   PTT (PARTIAL THROMBOPLASTIN TIME)   Result Value Ref Range    APTT 76.9 (H) 24.2 - 37.5 seconds    Narrative    Therapeutic range for unfractionated heparin is 60-100 seconds.   BASIC METABOLIC PANEL   Result Value Ref Range    SODIUM 137 136 - 145 mmol/L    POTASSIUM 3.7 3.5 - 5.1 mmol/L    CHLORIDE 104 96 - 111 mmol/L    CO2 TOTAL 28 23 - 31 mmol/L    ANION GAP 5 4 - 13 mmol/L    CALCIUM 8.1 (L) 8.8 - 10.2 mg/dL    GLUCOSE 149 (H) 65 - 125 mg/dL    BUN 19 8 - 25 mg/dL    CREATININE 0.58 (L) 0.75 - 1.35 mg/dL    BUN/CREA RATIO 33 (H)  6 - 22    ESTIMATED GFR >90 >=60 mL/min/BSA   CBC   Result Value Ref Range    WBC 12.4 (H) 3.7 - 11.0 x10^3/uL    RBC 2.53 (L) 4.50 - 6.10 x10^6/uL    HGB 7.2 (L) 13.4 - 17.5 g/dL    HCT 23.7 (L) 38.9 - 52.0 %    MCV 93.7 78.0 - 100.0 fL    MCH 28.5 26.0 - 32.0 pg    MCHC 30.4 (L) 31.0 - 35.5 g/dL    RDW-CV 17.8 (H) 11.5 - 15.5 %    PLATELETS 378 150 - 400 x10^3/uL    MPV 10.8 8.7 - 12.5 fL   MAGNESIUM   Result Value Ref Range    MAGNESIUM 2.2 1.8 - 2.6 mg/dL   PHOSPHORUS   Result Value Ref Range    PHOSPHORUS 1.9 (L) 2.3 - 4.0 mg/dL   H & H   Result Value Ref Range    HGB 7.5 (L) 13.4 - 17.5 g/dL    HCT 24.7 (L) 38.9 - 52.0 %   POC BLOOD GLUCOSE (RESULTS)   Result Value Ref Range    GLUCOSE, POC 245 (H) 70 - 105 mg/dl   POC BLOOD GLUCOSE (RESULTS)    Result Value Ref Range    GLUCOSE, POC 203 (H) 70 - 105 mg/dl   POC BLOOD GLUCOSE (RESULTS)   Result Value Ref Range    GLUCOSE, POC 165 (H) 70 - 105 mg/dl   POC BLOOD GLUCOSE (RESULTS)   Result Value Ref Range    GLUCOSE, POC 200 (H) 70 - 105 mg/dl       I/O:  Date 04/24/21 0700 - 04/25/21 0659 04/25/21 0700 - 04/26/21 0659   Shift 0700-1459 1500-2259 2300-0659 24 Hour Total 0700-1459 1500-2259 2300-0659 24 Hour Total   INTAKE   P.O.  120 30 150         Oral  120 30 150       I.V.(mL/kg/hr) 500(0.79) 330(0.52) 1250(1.97) 2080(1.09)         Volume Infused (electrolyte-A (PLASMALYTE-A) bolus infusion 500 mL) 500   500         Volume Infused (electrolyte-A (PLASMALYTE-A) premix infusion)  200 300 500         Volume Infused (adult custom parenteral nutrition)  130 450 580         Volume Infused (electrolyte-A (PLASMALYTE-A) bolus infusion 500 mL)   500 500       IV Piggyback  132  132 105   105     Volume (DAPTOmycin (CUBICIN) 800 mg in NS 50 mL IVPB)  132  132         Volume (micafungin (MYCAMINE) 100 mg in NS 100 mL IVPB)     105   105   Shift Total(mL/kg) 500(6.29) 582(7.32) 1280(16.1) 2362(29.71) 105(1.32)   105(1.32)   OUTPUT   Urine(mL/kg/hr) 495(0.78) 450(0.71) 950(1.49) 1895(0.99)         Urine (Voided) 495 [phone]       Drains 442.5 628 304 8091.5         JP Drain Output ([place] Drain Right;Upper Abdomen) 12.5 0 0 12.5         Drain Output (Drain (Miscellaneous) #35F pigtail catheter for RLQ fluid collection Lower;Right;Anterior Abdomen) 330 415 170 915         Drain Output (Drain (Miscellaneous) peds ostomy bag Lower;Medial Abdomen) 100  50 150  Shift Total(mL/kg) 937.5(11.79) 865(10.88) 9242(68.34) 2972.5(37.39)       Weight (kg) 79.5 79.5 79.5 79.5 79.5 79.5 79.5 79.5       Radiology  Results for orders placed or performed during the hospital encounter of 04/03/21 (from the past 72 hour(s))   EUS PROCEDURE     Status: None    Narrative    *Procedure not read by radiology.    *Please Refer  to Procedure Note for result.   FLUORO ERCP     Status: None    Narrative    *Procedure not read by radiology.    *Please Refer to Procedure Note for result.   XR AP MOBILE CHEST     Status: None    Narrative    JELANI TRUEBA Stainback  Male, 76 years old.    XR AP MOBILE CHEST performed on 04/24/2021 8:28 AM.    REASON FOR EXAM:  leukocytosis    TECHNIQUE: 1 views/1 images submitted for interpretation.    COMPARISON:  CT chest, abdomen, and pelvis dated 04/20/2021.    FINDINGS:  There is a right PICC with the tip projecting over the upper right atrium. Two separate upper abdominal drains are noted.    The heart is enlarged, unchanged. There is small left pleural fluid and bibasilar opacities. No definite pneumothorax is seen though the right lung apex is not entirely included within the field-of-view. No new consolidation is present.      Impression    Bibasilar opacities with small left pleural fluid. This appears stable from the CT on 04/20/2021 where this appeared as atelectasis. No new consolidation.       Current Medications:  Current Facility-Administered Medications   Medication Dose Route Frequency   . acetaminophen (TYLENOL) tablet  650 mg Oral Q4H   . adult custom parenteral nutrition   Intravenous Continuous   . atorvastatin (LIPITOR) tablet  40 mg Oral Daily with Breakfast   . benzonatate (TESSALON) capsule  100 mg Oral Q8H PRN   . D5W 250 mL flush bag   Intravenous Q15 Min PRN   . DAPTOmycin (CUBICIN) 800 mg in NS 50 mL IVPB  10 mg/kg (Adjusted) Intravenous Q24H   . docusate sodium (COLACE) capsule  100 mg Oral 2x/day   . electrolyte-A (PLASMALYTE-A) premix infusion   Intravenous Continuous   . elvitegravir-cobicistat-emtricitrabine-tenofovir (GENVOYA) 150-150-200-10 mg per tablet  1 Tablet Oral Daily   . gabapentin (NEURONTIN) capsule  300 mg Oral 3x/day   . heparin 25,000 units in NS 250 mL infusion  12 Units/kg/hr (Adjusted) Intravenous Continuous   . HYDROmorphone (DILAUDID) 1 mg/mL injection  0.2 mg  Intravenous Q3H PRN   . iopamidol (ISOVUE-300) 61% infusion  100 mL Intravenous Give in GI   . meropenem (MERREM) 1 g in NS 100 mL IVPB  1 g Intravenous Q8H   . methocarbamol (ROBAXIN) tablet  500 mg Oral 4x/day   . micafungin (MYCAMINE) 100 mg in NS 100 mL IVPB  100 mg Intravenous Q24H   . multivitamin-minerals-folic acid-lycopene-lutein (CERTAVITE SENIOR) tablet  1 Tablet Oral Q24H   . NS 250 mL flush bag   Intravenous Q15 Min PRN   . NS flush syringe  2-6 mL Intracatheter Q8HRS   . NS flush syringe  2-6 mL Intracatheter Q1 MIN PRN   . NS flush syringe  10-30 mL Intracatheter Q8HRS   . NS flush syringe  20-30 mL Intracatheter Q1 MIN PRN   . NS flush syringe  10-30 mL Intracatheter Q8HRS   .  NS flush syringe  20-30 mL Intracatheter Q1 MIN PRN   . ondansetron (ZOFRAN) 2 mg/mL injection  4 mg Intravenous Q6H PRN   . oxyCODONE (ROXICODONE) immediate release tablet  5 mg Oral Q4H PRN   . oxyCODONE (ROXICODONE) immediate releaste tablet  10 mg Oral Q4H PRN   . pantoprazole (PROTONIX) delayed release tablet  40 mg Oral Q12H   . potassium phosphate 30 mmol in NS 500 mL IVPB  30 mmol Intravenous Once   . prochlorperazine (COMPAZINE) 5 mg/mL injection  10 mg Intravenous Q6H PRN   . sennosides-docusate sodium (SENOKOT-S) 8.6-'50mg'$  per tablet  1 Tablet Oral 2x/day   . SSIP insulin R human (HUMULIN R) 100 units/mL injection  0-12 Units Subcutaneous 4x/day PRN         Micheline Chapman, MD  Department of Family Medicine, PGY-1  Pager: [phone]

## 2021-04-25 NOTE — Nurses Notes (Addendum)
Latest Reference Range & Units 04/25/21 01:15   HGB 13.4 - 17.5 g/dL 7.2 (L)     Paged service. HGB down from 10.6. MD Sowers called back, redraw H&H ordered.            Latest Reference Range & Units 04/25/21 01:49   HGB   13.4 - 17.5 g/dL 7.5 (L)     0200: H&H recheck 7.5. Paged service. No new orders at this time.

## 2021-04-26 DIAGNOSIS — Z8546 Personal history of malignant neoplasm of prostate: Secondary | ICD-10-CM

## 2021-04-26 DIAGNOSIS — Z923 Personal history of irradiation: Secondary | ICD-10-CM

## 2021-04-26 LAB — CREATINE KINASE (CK), TOTAL, SERUM: CREATINE KINASE: 16 U/L — ABNORMAL LOW (ref 45–225)

## 2021-04-26 LAB — BASIC METABOLIC PANEL
ANION GAP: 7 mmol/L (ref 4–13)
BUN/CREA RATIO: 21 (ref 6–22)
BUN: 13 mg/dL (ref 8–25)
CALCIUM: 8.6 mg/dL — ABNORMAL LOW (ref 8.8–10.2)
CHLORIDE: 101 mmol/L (ref 96–111)
CO2 TOTAL: 28 mmol/L (ref 23–31)
CREATININE: 0.61 mg/dL — ABNORMAL LOW (ref 0.75–1.35)
ESTIMATED GFR: >90 mL/min/BSA (ref >=60)
GLUCOSE: 97 mg/dL (ref 65–125)
POTASSIUM: 4 mmol/L (ref 3.5–5.1)
SODIUM: 136 mmol/L (ref 136–145)

## 2021-04-26 LAB — PTT (PARTIAL THROMBOPLASTIN TIME)
APTT: 102.8 seconds — ABNORMAL HIGH (ref 24.2–37.5)
APTT: 55.5 seconds — ABNORMAL HIGH (ref 24.2–37.5)
APTT: 38.1 seconds — ABNORMAL HIGH (ref 24.2–37.5)

## 2021-04-26 LAB — MANUAL DIFF AND MORPHOLOGY-SYSMEX
BASOPHIL #: 0.12 10*3/uL (ref <=0.20)
BASOPHIL %: 1 %
EOSINOPHIL #: 0.48 10*3/uL (ref <=0.50)
EOSINOPHIL %: 4 %
LYMPHOCYTE #: 1.8 10*3/uL (ref 1.00–4.80)
LYMPHOCYTE %: 15 %
MONOCYTE #: 0.6 10*3/uL (ref 0.20–1.10)
MONOCYTE %: 5 %
NEUTROPHIL #: 9 10*3/uL — ABNORMAL HIGH (ref 1.50–7.70)
NEUTROPHIL %: 75 %
NRBC FROM MANUAL DIFF: 7 per 100 WBC
RBC MORPHOLOGY: NORMAL

## 2021-04-26 LAB — HEPATIC FUNCTION PANEL
ALBUMIN: 1.6 g/dL — ABNORMAL LOW (ref 3.4–4.8)
ALKALINE PHOSPHATASE: 107 U/L (ref 45–115)
ALT (SGPT): 26 U/L (ref 10–55)
AST (SGOT): 27 U/L (ref 8–45)
BILIRUBIN DIRECT: 0.2 mg/dL (ref 0.1–0.4)
BILIRUBIN TOTAL: 0.4 mg/dL (ref 0.3–1.3)
PROTEIN TOTAL: 5.5 g/dL — ABNORMAL LOW (ref 6.0–8.0)

## 2021-04-26 LAB — CBC WITH DIFF
HCT: 26.2 % — ABNORMAL LOW (ref 38.9–52.0)
HGB: 7.9 g/dL — ABNORMAL LOW (ref 13.4–17.5)
MCH: 28.1 pg (ref 26.0–32.0)
MCHC: 30.2 g/dL — ABNORMAL LOW (ref 31.0–35.5)
MCV: 93.2 fL (ref 78.0–100.0)
MPV: 10.9 fL (ref 8.7–12.5)
PLATELETS: 383 10*3/uL (ref 150–400)
RBC: 2.81 10*6/uL — ABNORMAL LOW (ref 4.50–6.10)
RDW-CV: 18.1 % — ABNORMAL HIGH (ref 11.5–15.5)
WBC: 12 10*3/uL — ABNORMAL HIGH (ref 3.7–11.0)

## 2021-04-26 LAB — MAGNESIUM: MAGNESIUM: 2.2 mg/dL (ref 1.8–2.6)

## 2021-04-26 LAB — C-REACTIVE PROTEIN(CRP),INFLAMMATION: CRP INFLAMMATION: 137.8 mg/L — ABNORMAL HIGH (ref <8.0)

## 2021-04-26 LAB — PHOSPHORUS: PHOSPHORUS: 2 mg/dL — ABNORMAL LOW (ref 2.3–4.0)

## 2021-04-26 LAB — POC BLOOD GLUCOSE (RESULTS)
GLUCOSE, POC: 98 mg/dl (ref 70–105)
GLUCOSE, POC: 157 mg/dl — ABNORMAL HIGH (ref 70–105)
GLUCOSE, POC: 144 mg/dl — ABNORMAL HIGH (ref 70–105)
GLUCOSE, POC: 122 mg/dl — ABNORMAL HIGH (ref 70–105)

## 2021-04-26 MED ORDER — ELECTROLYTE-A INTRAVENOUS SOLUTION
INTRAVENOUS | Status: DC
Start: 2021-04-26 — End: 2021-05-04
  Administered 2021-05-04: 0 mL via INTRAVENOUS

## 2021-04-26 MED ORDER — SODIUM PHOSPHATE 3 MMOL/ML INTRAVENOUS SOLUTION
30.0000 mmol | Freq: Once | INTRAVENOUS | Status: AC
Start: 2021-04-26 — End: 2021-04-26
  Administered 2021-04-26: 0 mmol via INTRAVENOUS
  Administered 2021-04-26: 30 mmol via INTRAVENOUS
  Filled 2021-04-26: qty 10

## 2021-04-26 MED ORDER — ACETAMINOPHEN 500 MG TABLET
500.0000 mg | ORAL_TABLET | ORAL | Status: DC
Start: 2021-04-26 — End: 2021-05-05
  Administered 2021-04-26: 0 mg via ORAL
  Administered 2021-04-26 – 2021-04-27 (×6): 500 mg via ORAL
  Administered 2021-04-27: 0 mg via ORAL
  Administered 2021-04-28 – 2021-04-30 (×15): 500 mg via ORAL
  Administered 2021-04-30: 0 mg via ORAL
  Administered 2021-04-30 – 2021-05-01 (×3): 500 mg via ORAL
  Administered 2021-05-01: 0 mg via ORAL
  Administered 2021-05-01 (×3): 500 mg via ORAL
  Administered 2021-05-01 – 2021-05-02 (×2): 0 mg via ORAL
  Administered 2021-05-02 (×2): 500 mg via ORAL
  Administered 2021-05-02: 0 mg via ORAL
  Administered 2021-05-02: 500 mg via ORAL
  Administered 2021-05-02 – 2021-05-03 (×2): 0 mg via ORAL
  Administered 2021-05-03 (×4): 500 mg via ORAL
  Administered 2021-05-03: 0 mg via ORAL
  Administered 2021-05-04 – 2021-05-05 (×8): 500 mg via ORAL
  Filled 2021-04-26 (×4): qty 1

## 2021-04-26 NOTE — Care Plan (Signed)
[facility]  Occupational Therapy Progress Note    Patient Name: [name]  Date of Birth: [birth year]  Height:  175.3 cm (5' 9.02")  Weight:  79.5 kg (175 lb 4.3 oz)  Room/Bed: 14/A  Payor: MEDICARE / Plan: MEDICARE PART A AND B / Product Type: Medicare /     Assessment:    OT tx at b/s.  Pt notes he was sick over the weekend and has been diagnosed with a new fungal infection and PICC line was removed.  He was nonetheless willing to participate in therapy.  Wife present and supportive as well.  Improvements noted in bed mobility and functional mobility with FWW.  Anticipate pt will continue to progress and continue to recommend inpt rehab upon hospital d/c to facilitate safe d/c home with support of spouse and home health.      Discharge Needs:   Equipment Recommendation: to be determined      Discharge Disposition:  inpatient rehabilitation facility     JUSTIFICATION OF DISCHARGE RECOMMENDATION   Based on current diagnosis, functional performance prior to admission, and current functional performance, this patient requires continued OT services in inpatient rehabilitation facility in order to achieve significant functional improvements.    Plan:   Continue to follow patient according to established plan of care.  The risks/benefits of therapy have been discussed with the patient/caregiver and he/she is in agreement with the established plan of care.     Subjective & Objective:      04/26/21 1041   Therapist Pager   OT Assigned/ Pager # Shacola Schussler [phone]   Rehab Session   Document Type therapy progress note (daily note)   Total OT Minutes: 36   Patient Effort good   Symptoms Noted During/After Treatment fatigue;dizziness   General Information   Patient Profile Reviewed yes   Medical Lines Peripheral Drain;Telemetry;PIV Line   Respiratory Status nasal cannula  (1.5L)   Existing Precautions/Restrictions fall precautions;full code;contact isolation;oxygen therapy device and  L/min   Pre Treatment Status   Pre Treatment Patient Status Patient supine in bed;Call light within reach;Telephone within reach;Sitter select activated;Nurse approved session   Support Present Pre Treatment  Family present   Communication Pre Treatment  Nurse   Mutuality/Individual Preferences   Individualized Care Needs OOB with [place] with assist of 2 or Denna Haggard; encourage participation in adl routine   Plan of Care Reviewed With patient;spouse   Vital Signs   O2 Delivery Pre Treatment supplemental O2   O2 Delivery Post Treatment supplemental O2   Vitals Comment wfls on 2L on portable tank during ambulation   Pain Assessment   Pre/Posttreatment Pain Comment no c/o pain   Coping/Psychosocial   Observed Emotional State calm;cooperative;flat   Verbalized Emotional State acceptance   Family/Support System   Family/Support Persons spouse   Involvement in Care at bedside;interacting with patient;supportive of patient   Coping/Psychosocial Response Interventions   Plan Of Care Reviewed With patient;spouse   Trust Relationship/Rapport care explained;choices provided;questions encouraged;questions answered;emotional support provided;reassurance provided;thoughts/feelings acknowledged   Cognitive Assessment/Interventions   Behavior/Mood Observations alert;cooperative;flat affect   Orientation Status oriented x 4   Attention WNL/WFL   Follows Commands WNL   Bed Mobility Assessment/Treatment   Bed Mobility, Assistive Device Head of Bed Elevated   Roll Left [place] minimum assist (75% patient effort)   Sidelying to Sit, [place] minimum assist (75% patient effort);verbal cues required   Transfer Assessment/Treatment   Sit-Stand [place] moderate assist (50% patient effort);2  person assist required;verbal cues required   Stand-Sit [place] moderate assist (50% patient effort);2 person assist required;verbal cues required   Sit-Stand-Sit, Assist Device walker, front wheeled   Bed-Chair [place] minimum  assist (75% patient effort);2 person assist required;verbal cues required   Bed-Chair-Bed Assist Device walker, front wheeled   Transfer Comment modA sit to stand, but once standing pt is able to pivot with FWW with minA of 2   Gait Assessment/Treatment   Independence  minimum assist (75% patient effort);2 person assist required;verbal cues required   Total Distance Ambulated 15   Assistive Device  walker, front wheeled   Gait Speed slow   ADL Assessment/Intervention   ADL Comments education provided to pt/family re: requesting new urinal bottles every days to decrease risk for infection   Toileting Assessment/Training   Comment Pt used urinal bottle at bed level prior to my arrival with set up assist   Balance Skill Training   Comment standing with FWW   Sitting Balance: Static fair balance   Sitting, Dynamic (Balance) fair - balance   Sit-to-Stand Balance fair - balance   Standing Balance: Static fair - balance   Standing Balance: Dynamic fair - balance   Therapeutic Exercise/Activity   Comment instruction provided in safe use of FWW including hand placement during transfers and use during adl related mobility.  Discussed plan to continue to progress activity including engagement in [place] Treatment Patient Status Patient sitting in bedside chair or w/c;Call light within reach;Telephone within reach;Sitter select activated   Support Present Post Treatment  Family present   Technical sales engineer Treatment Comment pt performance   Clinical Impression   Functional Level at Time of Session OT tx at b/s.  Pt notes he was sick over the weekend and has been diagnosed with a new fungal infection and PICC line was removed.  He was nonetheless willing to participate in therapy.  Wife present and supportive as well.  Improvements noted in bed mobility and functional mobility with FWW.  Anticipate pt will continue to progress and continue to recommend inpt rehab  upon hospital d/c to facilitate safe d/c home with support of spouse and home health.   Anticipated Equipment Needs at Discharge to be determined   Anticipated Discharge Disposition inpatient rehabilitation facility   Highest level of Mobility score   Exercise/Activity Level Performed 6- Walked 10 steps or more         Therapist:   Forbes Cellar, OT  Pager #: [phone]

## 2021-04-26 NOTE — Consults (Addendum)
PATIENT NAME: [name] NUMBER:  [id number]  DATE OF SERVICE: 04/26/2021  DATE OF BIRTH:  Dec 26, 1945    CONSULTATION    INFECTIOUS DISEASE CONSULT FOLLOWUP NOTE    Length of hospital stay:  23 days.    REASON FOR CONSULT:  Fungemia.    SUBJECTIVE:  [name] is currently resting in bed.  He reports overall feeling improved over the last several days.  He did have an episode of nausea and emesis approximately 3 to 4 days ago which has resolved.  He does report his diet is planning to be advanced today.  He has not yet had breakfast.  Denies prosthetic joints.  Denies visual changes or blurred vision.  Does require bifocal lenses at baseline.    OBJECTIVE:  Filed Vitals:    04/26/21 0734 04/26/21 0900 04/26/21 1100 04/26/21 1509   BP: 131/76 (!) 140/82 137/82 132/84   Pulse: 96 (!) 105 98 94   Resp: '15 16 16 16   '$ Temp:  36.7 C (98 F) 37 C (98.6 F)    SpO2: 94% 94% 93% 96%     Constitutional:  This is a chronically ill elderly gentleman in no acute distress.  Neurologic:  The patient is alert and oriented to person, place, and time.  Eyes:  Sclerae nonicteric.  Conjunctivae not injected.  HEENT:  Mucosa is moist.  No oral candidiasis.  Respiratory:  Currently on 1.5 L nasal cannula without accessory muscle use.  Full breath sounds anteriorly without crackles or wheeze.  Cardiovascular:  Heart has a regular rate and rhythm without appreciable murmur.  Gastrointestinal:  Abdomen with midline staples in place to the superior aspect and ostomy bag covering inferior incision site producing stool.  Two JP drains in place producing turbid tan green fluid, tender to palpation over the right upper quadrant.  Normoactive bowel sounds present.  Musculoskeletal:  No swelling of the upper or lower extremities.  Integumentary:  Skin is warm and diaphoretic.  No diffuse cutaneous rashes.    ANTIBIOTICS:  1. Vancomycin, cefepime, and metronidazole-April 08, 2021, to April 12, 2021.    2. Daptomycin and  meropenem-April 12, 2021, through present.  3. Micafungin-April 25, 2021, through present.    LINES:  Patient Lines/Drains/Airways Status       Active Line / Dialysis Catheter / Dialysis Graft / Drain / Airway / Wound       Name Placement date Placement time Site Days    Peripheral IV Ultrasound guided Left;Lower Cephalic  (lateral side of arm) 04/25/21  1000  -- 1    Peripheral IV Anterior;Right;Upper Arm 04/25/21  --  -- 1    Terrial Rhodes Drain Right;Upper Abdomen 04/03/21  1055  -- 23    Drain (Miscellaneous) #36F pigtail catheter for RLQ fluid collection Lower;Right;Anterior Abdomen 04/09/21  1433  -- 17    Drain (Miscellaneous) peds ostomy bag Lower;Medial Abdomen 04/12/21  1300  -- 14    Surgical Incision Abdomen 04/03/21  1046  -- 23    Surgical Incision Other (Comment) Left;Upper Abdomen 04/03/21  1400  -- 23                  LABS:  Results for orders placed or performed during the hospital encounter of 04/03/21 (from the past 24 hour(s))   PTT (PARTIAL THROMBOPLASTIN TIME) - ONCE   Result Value Ref Range    APTT 70.3 (H) 24.2 - 37.5 seconds    Narrative    Therapeutic  range for unfractionated heparin is 60-100 seconds.   CREATINE KINASE (CK), TOTAL, SERUM   Result Value Ref Range    CREATINE KINASE 16 (L) 45 - 225 U/L   HEPATIC FUNCTION PANEL   Result Value Ref Range    ALBUMIN 1.6 (L) 3.4 - 4.8 g/dL     ALKALINE PHOSPHATASE 107 45 - 115 U/L    ALT (SGPT) 26 10 - 55 U/L    AST (SGOT)  27 8 - 45 U/L    BILIRUBIN TOTAL 0.4 0.3 - 1.3 mg/dL    BILIRUBIN DIRECT 0.2 0.1 - 0.4 mg/dL    PROTEIN TOTAL 5.5 (L) 6.0 - 8.0 g/dL   C-REACTIVE PROTEIN(CRP),INFLAMMATION   Result Value Ref Range    CRP INFLAMMATION 137.8 (H) <8.0 mg/L   CBC/DIFF    Narrative    The following orders were created for panel order CBC/DIFF.  Procedure                               Abnormality         Status                     ---------                               -----------         ------                     CBC WITH DIFF[[id number]]                 Abnormal            Final result               MANUAL DIFF AND MORPHOLO.Marland KitchenMarland Kitchen[[id number]]  Abnormal            Final result                 Please view results for these tests on the individual orders.   BASIC METABOLIC PANEL   Result Value Ref Range    SODIUM 136 136 - 145 mmol/L    POTASSIUM 4.0 3.5 - 5.1 mmol/L    CHLORIDE 101 96 - 111 mmol/L    CO2 TOTAL 28 23 - 31 mmol/L    ANION GAP 7 4 - 13 mmol/L    CALCIUM 8.6 (L) 8.8 - 10.2 mg/dL    GLUCOSE 97 65 - 125 mg/dL    BUN 13 8 - 25 mg/dL    CREATININE 0.61 (L) 0.75 - 1.35 mg/dL    BUN/CREA RATIO 21 6 - 22    ESTIMATED GFR >90 >=60 mL/min/BSA   PHOSPHORUS   Result Value Ref Range    PHOSPHORUS 2.0 (L) 2.3 - 4.0 mg/dL   MAGNESIUM   Result Value Ref Range    MAGNESIUM 2.2 1.8 - 2.6 mg/dL   PTT (PARTIAL THROMBOPLASTIN TIME) - ONCE   Result Value Ref Range    APTT 102.8 (H) 24.2 - 37.5 seconds    Narrative    Therapeutic range for unfractionated heparin is 60-100 seconds.   CBC WITH DIFF   Result Value Ref Range    WBC 12.0 (H) 3.7 - 11.0 x10^3/uL    RBC 2.81 (L) 4.50 - 6.10 x10^6/uL    HGB 7.9 (L) 13.4 - 17.5 g/dL  HCT 26.2 (L) 38.9 - 52.0 %    MCV 93.2 78.0 - 100.0 fL    MCH 28.1 26.0 - 32.0 pg    MCHC 30.2 (L) 31.0 - 35.5 g/dL    RDW-CV 18.1 (H) 11.5 - 15.5 %    PLATELETS 383 150 - 400 x10^3/uL    MPV 10.9 8.7 - 12.5 fL   MANUAL DIFF AND MORPHOLOGY-SYSMEX   Result Value Ref Range    NEUTROPHIL % 75 %    LYMPHOCYTE %  15 %    MONOCYTE % 5 %    EOSINOPHIL % 4 %    BASOPHIL % 1 %    NEUTROPHIL # 9.00 (H) 1.50 - 7.70 x10^3/uL    LYMPHOCYTE # 1.80 1.00 - 4.80 x10^3/uL    MONOCYTE # 0.60 0.20 - 1.10 x10^3/uL    EOSINOPHIL # 0.48 <=0.50 x10^3/uL    BASOPHIL # 0.12 <=0.20 x10^3/uL    NRBC FROM MANUAL DIFF 7 per 100 WBC    RBC MORPHOLOGY Normal RBC and PLT Morphology    PTT (PARTIAL THROMBOPLASTIN TIME) - ONCE   Result Value Ref Range    APTT 55.5 (H) 24.2 - 37.5 seconds    Narrative    Therapeutic range for unfractionated heparin is 60-100 seconds.   POC BLOOD GLUCOSE  (RESULTS)   Result Value Ref Range    GLUCOSE, POC 136 (H) 70 - 105 mg/dl   POC BLOOD GLUCOSE (RESULTS)   Result Value Ref Range    GLUCOSE, POC 115 (H) 70 - 105 mg/dl   POC BLOOD GLUCOSE (RESULTS)   Result Value Ref Range    GLUCOSE, POC 98 70 - 105 mg/dl   POC BLOOD GLUCOSE (RESULTS)   Result Value Ref Range    GLUCOSE, POC 157 (H) 70 - 105 mg/dl     MICROBIOLOGY DATA:  Blood cultures x2 sets obtained April 24, 2021, are positive in 1 out of 2 sets, positive set obtained from the PICC line for Candida albicans.    IMAGING:  Results for orders placed or performed during the hospital encounter of 04/03/21 (from the past 72 hour(s))   XR AP MOBILE CHEST     Status: None    Narrative    CADE DASHNER Faison  Male, 76 years old.    XR AP MOBILE CHEST performed on 04/24/2021 8:28 AM.    REASON FOR EXAM:  leukocytosis    TECHNIQUE: 1 views/1 images submitted for interpretation.    COMPARISON:  CT chest, abdomen, and pelvis dated 04/20/2021.    FINDINGS:  There is a right PICC with the tip projecting over the upper right atrium. Two separate upper abdominal drains are noted.    The heart is enlarged, unchanged. There is small left pleural fluid and bibasilar opacities. No definite pneumothorax is seen though the right lung apex is not entirely included within the field-of-view. No new consolidation is present.      Impression    Bibasilar opacities with small left pleural fluid. This appears stable from the CT on 04/20/2021 where this appeared as atelectasis. No new consolidation.   CT ABDOMEN PELVIS W IV CONTRAST     Status: None    Narrative    DANNEL RAFTER Kostelecky  Male, 76 years old.    CT ABDOMEN PELVIS W IV CONTRAST performed on 04/25/2021 9:48 AM.    REASON FOR EXAM:  Increased leukocytosis    TECHNIQUE: Volumetric images were obtained from the lung bases through the pelvis with  axial, coronal, and sagittal reformatting.    RADIATION DOSE: 401.99 mGy.cm    COMPARISON: 04/20/2021    FINDINGS:   LUNG BASES: Trace bilateral  pleural fluid. Right basilar consolidation with air bronchograms, slightly improved from the prior.    LIVER: Liver is normal in size. Perihepatic fluid collection has near completely resolved with pigtail drain in stable positioning. Hepatic and portal veins are patent.    BILIARY SYSTEM/GALLBLADDER: Gallbladder is physiologically distended. Pericholecystic portion of the fluid collection has decreased in size. No biliary duct dilation.    PANCREAS: Postoperative changes from distal pancreatectomy. Rim-enhancing fluid collection within the surgical bed is stable in size, measuring approximately 5.6 x 2.6 cm, although likely not adequately measured due to metallic streak artifact.    KIDNEY/URETERS/BLADDER: Kidneys are symmetric in enhancement. No hydronephrosis. Multiple bilateral simple renal cysts. Ureters are of normal course and caliber. Bladder is distended without perivesicular stranding.    ADRENALS: Normal in size and unremarkable in appearance.    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 Left upper quadrant fluid collection with surgical drain in place is similar in appearance to the prior study. Fluid collection measures 11.4 x 8.1 cm.    BOWEL: The bowel is nondilated. No evidence for obstruction. Intraluminal contrast is seen throughout the majority of the colon. Scattered diverticulosis. Stomach is mostly decompressed. There is some stranding along the gastroduodenal junction, likely postoperative. Bowel anastomosis just superior to the bladder appears widely patent.    VASCULATURE/LYMPH NODES: Aorta is normal in caliber. Scattered atherosclerotic calcifications are present. IVC filter in place. Celiac axis at the origin is patent however due to artifact the remaining celiac axis is not well assessed.    REPRODUCTIVE ORGANS: Postsurgical changes within the prominent prostate.    PERITONEAL CAVITY: Near complete resolution of postoperative pneumoperitoneum. Multiple fluid collections involving  the liver, splenectomy bed, and distal pancreatectomy site are discussed above. No new fluid collection is identified.    BONES/SOFT TISSUES: Midline abdominal incision again seen with underlying fluid/inflammatory change, appears more prominent than on the prior study. Multilevel degenerative changes of the spine. Vertebral body heights are maintained. Stable inflammation and fluid within the right groin, likely related to recent central venous access.        Impression    1.Increased fluid and inflammation along the midline abdominal wall incision when compared to 04/20/2021, may represent developing soft tissue infection. No contrast within this collection to suggest colocutaneous fistula, which was identified on the prior study.  2.Stable fluid collections within the splenectomy bed and pancreatic resection cavity.  3.Interval improvement of perihepatic fluid collection with pigtail drain remaining in place.  4.Persistent but improving right lower lobe consolidation.  5.Celiac origin is patent however the remaining celiac artery is not well assessed due to metallic well artifact.       ASSESSMENT:  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  He was identified to have a multiloculated right upper quadrant fluid collection, status post drain placement on March 10 with cultures positive for Pseudomonas aeruginosa and Enterococcus faecium.  He has been on treatment with daptomycin and meropenem  for infected hematoma at the postsurgical site  with Pseudomonas aeruginosa bacteremia.  Infectious Disease is now being consulted given identification of Candida albicans fungemia.  High suspicion for PICC line associated infection.  Certainly at high risk given TPN intraabdominal infection broad-spectrum antibacterial therapy.  PICC line has been explanted on April 25, 2021.    1. Continue to follow pending blood cultures and susceptibility testing.  2. Agree with PICC line explantation.  3. Consult Ophthalmology for evaluation of fungal endophthalmitis.  4. Repeat blood cultures x2 sets today.  5. Agree with initiation of micafungin 100 mg IV q.24 hours.  6. Continue daptomycin 10 mg/kg IV q.24 hours.  7. Continue meropenem 1 gram IV q.8 hours.  8. Continue Genvoya.   9. Follow CBC + differential, BMP, LFT panel, and CRP for safety and monitoring while on antibiotics.   We will continue follow the patient.  Please call with any further questions.    I independently of the faculty provider spent a total of 30 minutes in direct/indirect care of this patient including initial evaluation, review of laboratory, radiology, diagnostic studies, review of medical record, order entry and coordination of care.      Monica Martinez, PA-C  04/26/2021, 16:51      DD:  04/26/2021 14:47:09  DT:  04/26/2021 16:00:23 JR  D#:  [id number]    I saw and examined the patient. I reviewed the APP's note. I agree with the findings and plan of care as documented in the APP's note. Any exceptions/additions are edited/noted.    The patient is accompanied by his wife in the room. He endorses some discomfort given all the drains in place. We discussed Candidemia and plan to treat with micafungin. Continue broad-spectrum abx.    On the day of the encounter, a total of 15 minutes was spent on this patient encounter including review of historical information, examination, documentation and post-visit activities. The time documented excludes procedural time.    Ortencia Kick,  MD  Assistant Professor of Medicine  Infectious Diseases, Transplant

## 2021-04-26 NOTE — Care Plan (Signed)
[facility]  Physical Therapy Progress Note      Patient Name: [name]  Date of Birth: 1945/12/25  Height:  175.3 cm (5' 9.02")  Weight:  79.5 kg (175 lb 4.3 oz)  Room/Bed: 14/A  Payor: MEDICARE / Plan: MEDICARE PART A AND B / Product Type: Medicare /     Assessment:     PT treatment complete, pt tolerating well and demos improving function, completes bed mobility to EOB Min A, transfers STS Mod A x 2, and ambulates 54f with FWW. Pt limited by endurance, strength, and balance. Anticipate d/c to inpatient rehab when medically appropriate.    Discharge Needs:   Equipment Recommendation: TBD    Discharge Disposition: inpatient rehabilitation facility    JUSTIFICATION OF DISCHARGE RECOMMENDATION   Based on current diagnosis, functional performance prior to admission, and current functional performance, this patient requires continued PT services in inpatient rehabilitation facility in order to achieve significant functional improvements in these deficit areas: aerobic capacity/endurance, gait, locomotion, and balance, muscle performance.      Plan:   Continue to follow patient according to established plan of care.  The risks/benefits of therapy have been discussed with the patient/caregiver and he/she is in agreement with the established plan of care.     Subjective & Objective:        04/26/21 1040   Therapist Pager   PT Assigned/ Pager # [phone]   Rehab Session   Document Type therapy progress note (daily note)   Total PT Minutes: 36   Patient Effort good   Symptoms Noted During/After Treatment fatigue;dizziness   General Information   Patient Profile Reviewed yes   Medical Lines Peripheral Drain;Telemetry;PIV Line   Respiratory Status nasal cannula  (1.5L)   Existing Precautions/Restrictions fall precautions;full code;contact isolation   Mutuality/Individual Preferences   Individualized Care Needs OOB with FWW Ax2 vs sara stedy   Plan of Care Reviewed With  patient;spouse   Pre Treatment Status   Pre Treatment Patient Status Patient supine in bed;Call light within reach;Telephone within reach;Sitter select activated   Support Present Pre Treatment  Family present   Communication Pre Treatment  Nurse   Communication Pre Treatment Comment cleared for session   Cognitive Assessment/Interventions   Behavior/Mood Observations alert;cooperative;flat affect   Orientation Status oriented x 4   Attention WNL/WFL   Follows Commands WNL   Vital Signs   Vitals Comment VSS on 2l   Pain Assessment   Pre/Posttreatment Pain Comment no c/o pain   Bed Mobility Assessment/Treatment   Bed Mobility, Assistive Device Head of Bed Elevated   Safety Issues decreased use of legs for bridging/pushing;decreased use of arms for pushing/pulling;impaired trunk control for bed mobility   Impairments balance impaired;endurance;strength decreased;flexibility decreased   Sidelying to Sit, Independence minimum assist (75% patient effort);verbal cues required   Roll Left [place] minimum assist (75% patient effort)   Transfer Assessment/Treatment   Sit-Stand Independence moderate assist (50% patient effort);2 person assist required;verbal cues required   Stand-Sit [place] moderate assist (50% patient effort);2 person assist required;verbal cues required   Sit-Stand-Sit, Assist Device walker, front wheeled   Bed-Chair Independence minimum assist (75% patient effort);2 person assist required;verbal cues required   Bed-Chair-Bed Assist Device walker, front wheeled   Gait Assessment/Treatment   Total Distance Ambulated 15   Independence  minimum assist (75% patient effort);2 person assist required;verbal cues required   Assistive Device  walker, front wheeled   Distance in Feet 15  Gait Speed decreased   Safety Issues  balance decreased during turns;step length decreased;sequencing ability decreased   Impairments  balance impaired;endurance;strength decreased;flexibility decreased   Balance Skill  Training   Comment standing with FWW   Sitting Balance: Static fair balance   Sitting, Dynamic (Balance) fair - balance   Sit-to-Stand Balance fair - balance   Standing Balance: Static fair - balance   Standing Balance: Dynamic fair - balance   Post Treatment Status   Post Treatment Patient Status Patient sitting in bedside chair or w/c;Call light within reach;Telephone within reach;Sitter select activated   Support Present Post Treatment  Family present   Clinical research associate Post Treatment Comment pt performance   Plan of Care Review   Plan Of Care Reviewed With patient;spouse   Basic Mobility Am-PAC/6Clicks Score (APPROVED PT Staff, [facility], [facility] ONLY, [place], [place], and FMT)   Turning in bed without bedrails 3   Lying on back to sitting on edge of flat bed 3   Moving to and from a bed to a chair 2   Standing up from chair 2   Walk in room 2   Climbing 3-5 steps with railing 1   6 Clicks Raw Score total 13   Standardized (t-scale) score 33.99   CMS 0-100% Score 57.65   CMS Modifier CK   Patient Mobility Goal (JHHLM) 6- Walk 10 steps or more 2X/day   Exercise/Activity Level Performed 6- Walked 10 steps or more   Physical Therapy Clinical Impression   Assessment PT treatment complete, pt tolerating well and demos improving function, completes bed mobility to EOB Min A, transfers STS Mod A x 2, and ambulates 4f with FWW. Pt limited by endurance, strength, and balance. Anticipate d/c to inpatient rehab when medically appropriate.   Anticipated Equipment Needs at Discharge (PT) TBD   Anticipated Discharge Disposition inpatient rehabilitation facility       Therapist:   CTheodosia Blender PT   Pager #: [phone]

## 2021-04-26 NOTE — Consults (Signed)
[facility] Medicine  Ophthalmology Initial Consult Note      [name], [name], 76 y.o. male  Date of Service:  04/26/2021       Requesting physician: Nicola Police, MD     Information Obtained from: patient  Chief Complaint:  Fungemia with no vision complains    The American Academy of Ophthalmology recommends against routine screening in the setting of candida fungemia when there is no sign of an ocular infection: [email or web address]    HPI/Discussion:  [name] is a 76 y.o. male PMH HIV undectable virus load, currently admitted 2/2 extravasation at surgical site from recent pancreatectomy and splenectomy. He was found to have fungemia and Ophthalmology was subsequently consulted. He denies any vision complaints currently.    Past Ocular Hx:  none  Ocular Meds:  none  Family ocular history: none    Past Medical History:   Diagnosis Date    Cancer (CMS HCC)     prostate    Deep vein thrombosis (DVT) (CMS HCC)     right leg    Esophageal reflux     H/O hearing loss     High cholesterol     History of kidney disease     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    HTN (hypertension)     Human immunodeficiency virus (HIV) disease (CMS HCC)     Hyperlipidemia     "borderline"    Hypothyroidism     MRSA (methicillin resistant staph aureus) culture positive 01/02/2021    MRSA left groin abscess 01/03/21    MRSA (methicillin resistant staph aureus) culture positive 01/02/2021    MRSA blood 01/02/21    Pulmonary embolism (CMS [place]) 03/31/2021    Thyroid disorder     Wears glasses          Past Surgical History:   Procedure Laterality Date    COLON SURGERY      per patient had 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 Darryl Nestle CERVICAL SPINE SURGERY  2001    HX COLOSTOMY REVERSAL      HX HERNIA REPAIR      KNEE  SURGERY Bilateral     WRIST SURGERY Right            Prior to Admission Meds:  Medications Prior to Admission       Prescriptions    acetaminophen (TYLENOL) 500 mg Oral Tablet    Take 2 Tablets (1,000 mg total) by mouth Every 4 hours as needed for Pain    amLODIPine (NORVASC) 5 mg Oral Tablet    Take 1 Tablet (5 mg total) by mouth Once a [name]    apixaban (ELIQUIS) 5 mg Oral Tablet    Take 2 Tablets (10 mg total) by mouth Twice daily for 14 doses    apixaban (ELIQUIS) 5 mg Oral Tablet    Take 1 Tablet (5 mg total) by mouth Twice daily for 180 days    atorvastatin (LIPITOR) 80 mg Oral Tablet    Take 0.5 Tablets (40 mg total) by mouth Every morning with breakfast    BIOTIN ORAL    Take 1 Tablet by mouth Once a [name]    Blood Sugar Diagnostic (ACCU-CHEK GUIDE TEST STRIPS) Strip  1 Strip Four times a [name] - before meals and bedtime    cetirizine (ZYRTEC) 10 mg Oral Tablet    Take 1 Tablet (10 mg total) by mouth Once a [name]    cyanocobalamin (VITAMIN B 12) 1,000 mcg Oral Tablet    Take 1 Tablet (1,000 mcg total) by mouth Every morning    elviteg-cob-emtri-tenof ALAFEN (GENVOYA) 150-150-200-10 mg Oral Tablet    Take 1 Tablet by mouth Once a [name]    flash glucose scanning reader (FREESTYLE LIBRE 2 READER) Does not apply Misc    Use as directed with FreeStyle Libre 2 sensors    flash glucose sensor (FREESTYLE LIBRE 2 SENSOR) Does not apply Kit    To check blood glucose continuously. Change every 14 days    gabapentin (NEURONTIN) 300 mg Oral Capsule    Take 1 Capsule (300 mg total) by mouth Three times a [name]    insulin aspart U-100 (NOVOLOG) 100 unit/mL (3 mL) Subcutaneous Insulin Pen    Inject 4 Units under the skin Three times a [name] With meals    insulin glargine (LANTUS SOLOSTAR U-100 INSULIN) 100 unit/mL Subcutaneous Insulin Pen    Inject 15 units under the skin daily    insulin syr/ndl U100 half mark 0.3 mL 31 gauge x 5/16" Syringe    Use a new syringe for each injection.    lancets (FREESTYLE LANCETS) 28 gauge Misc    Use  to test blood sugar 4 times a [name] - before meals and bedtime    levothyroxine (SYNTHROID) 88 mcg Oral Tablet    Take 1 Tablet (88 mcg total) by mouth Every morning    melatonin 5 mg Oral Tablet    Take 1 Tablet (5 mg total) by mouth Every night    MULTIVITAMIN ORAL    Take 1 Tablet by mouth Once a [name]    pantoprazole (PROTONIX) 40 mg Oral Tablet, Delayed Release (E.C.)    Take 1 Tablet (40 mg total) by mouth Twice daily 30 minutes before a meal    Pen Needle, Disposable, (BD NANO 2ND GEN PEN NEEDLE) 32 gauge x 5/32" Needle    Use a new pen needle for each injection.    POTASSIUM ORAL    Take 1 Tablet by mouth Once a [name]            Inpatient Meds:  acetaminophen (TYLENOL) tablet, 500 mg, Oral, Q4H  [Held by provider] adult custom parenteral nutrition, , Intravenous, Continuous  atorvastatin (LIPITOR) tablet, 40 mg, Oral, Daily with Breakfast  benzonatate (TESSALON) capsule, 100 mg, Oral, Q8H PRN  D5W 250 mL flush bag, , Intravenous, Q15 Min PRN  DAPTOmycin (CUBICIN) 800 mg in NS 50 mL IVPB, 10 mg/kg (Adjusted), Intravenous, Q24H  docusate sodium (COLACE) capsule, 100 mg, Oral, 2x/[name]  electrolyte-A (PLASMALYTE-A) premix infusion, , Intravenous, Continuous  elvitegravir-cobicistat-emtricitrabine-tenofovir (GENVOYA) 150-150-200-10 mg per tablet, 1 Tablet, Oral, Daily  gabapentin (NEURONTIN) capsule, 300 mg, Oral, 3x/[name]  heparin 25,000 units in NS 250 mL infusion, 12 Units/kg/hr (Adjusted), Intravenous, Continuous  HYDROmorphone (DILAUDID) 1 mg/mL injection, 0.2 mg, Intravenous, Q3H PRN  iopamidol (ISOVUE-300) 61% infusion, 100 mL, Intravenous, Give in GI  meropenem (MERREM) 1 g in NS 100 mL IVPB, 1 g, Intravenous, Q8H  methocarbamol (ROBAXIN) tablet, 500 mg, Oral, 4x/[name]  micafungin (MYCAMINE) 100 mg in NS 100 mL IVPB, 100 mg, Intravenous, Q24H  multivitamin-minerals-folic acid-lycopene-lutein (CERTAVITE SENIOR) tablet, 1 Tablet, Oral, Q24H  NS 250 mL flush bag, , Intravenous, Q15 Min PRN  NS flush syringe, 2-6 mL,  Intracatheter, Q8HRS  NS flush syringe, 2-6 mL, Intracatheter, Q1 MIN PRN  NS flush syringe, 10-30 mL, Intracatheter, Q8HRS  NS flush syringe, 20-30 mL, Intracatheter, Q1 MIN PRN  NS flush syringe, 10-30 mL, Intracatheter, Q8HRS  NS flush syringe, 20-30 mL, Intracatheter, Q1 MIN PRN  ondansetron (ZOFRAN) 2 mg/mL injection, 4 mg, Intravenous, Q6H PRN  oxyCODONE (ROXICODONE) immediate release tablet, 5 mg, Oral, Q4H PRN  oxyCODONE (ROXICODONE) immediate releaste tablet, 10 mg, Oral, Q4H PRN  pantoprazole (PROTONIX) delayed release tablet, 40 mg, Oral, Q12H  prochlorperazine (COMPAZINE) 5 mg/mL injection, 10 mg, Intravenous, Q6H PRN  sennosides-docusate sodium (SENOKOT-S) 8.6-50mg per tablet, 1 Tablet, Oral, 2x/[name]  SSIP insulin R human (HUMULIN R) 100 units/mL injection, 0-12 Units, Subcutaneous, 4x/[name] PRN        No Known Allergies  Social History     Tobacco Use    Smoking status: Never    Smokeless tobacco: Never   Substance Use Topics    Alcohol use: Not Currently     Family Medical History:       Problem Relation (Age of Onset)    Cancer Mother, Father, Other    Heart Attack Mother    High Cholesterol Other    Stroke Other              ROS: Other than ROS in the HPI, all other systems were negative.    Exam:  Temperature: 37 C (98.6 F)  Heart Rate: 94  BP (Non-Invasive): 132/84  Respiratory Rate: 16  SpO2: 96 %    Visual Acuity:   Near with glasses  ph  Distance    OD 20/30  20/25-2      OS 20/25  20/25        OD OS   Confr Vis Fields WNL WNL   EOM (Primary) WNL WNL   Pupils WNL WNL   Reaction, Direct WNL WNL                  Consensual WNL WNL                  RAPD No No        Adnexa            WNL WNL   Conjunctiva (palpebral) WNL WNL   Conjunctiva (bulbar) WNL WNL   Cornea WNL WNL   Anterior chamber WNL WNL   Iris WNL WNL   Lens:  NSC NSC   IOP 12 13        Fundus - Dilated? Yes    Optic Disc - C:D Ratio 0.3 0.3                      Appearance  WNL WNL   Post Seg:  Retina                     Vitreous WNL  WNL                   Vessels  WNL WNL                   Macula WNL WNL                   Periphery WNL WNL     Neuro:  Oriented to person, place, and time:  Yes  Psychiatric:  Mood and Affect Appropriate:  Yes    Labs/imaging:  A/P:   76 y.o. M currently admitted after surgical procedure and found to have fungemia. Visual acuity is 20/30 and 20/25. IOP is WNL. EOM full. No APD. Anterior exam with developing cataracts. Posterior exam unremarkable.    Recommendations  - Normal fundus exam  - Patient will follow-up with his eye doctor at the [facility] for cataract  - No acute ophthalmic interventions at this time    Appreciate the consultation.     Daine Floras, MD 04/26/2021, 17:00    Late entry. I saw and examined the patient on 04/27/3021    I saw and examined the patient.  I reviewed the resident's note.  I agree with the findings and plan of care as documented in the resident's note.  Any exceptions/additions are edited/noted.    Fonnie Jarvis, MD  Fonnie Jarvis, MD  04/27/2021, 19:28

## 2021-04-26 NOTE — Nurses Notes (Signed)
Latest Reference Range & Units 04/26/21 03:25   aPTT 24.2 - 37.5 seconds 102.8 (H)       Per protocol, heparin gtt held for 60 minutes.  Restart at 0500 and decrease by 3u/kg/hr.

## 2021-04-26 NOTE — Care Plan (Signed)
[facility]  Medical Nutrition Therapy Follow Up    SUBJECTIVE: Visited with patient and wife at bedside this am, patient sitting up in chair at time. Patient had his PICC line removed 3/26 and TPN discontinued at that time. Diet advanced to diabetic and ensure plus restarted-per patient preference.  Discussed diet advance and to try to eat small meals throughout the day while sipping on the ensure plus. Patient motivated to increase po intake and not have TPN restarted.     OBJECTIVE:     Current Diet Order/Nutrition Support:  MNT PROTOCOL FOR DIETITIAN  DIETARY ORAL SUPPLEMENTS Oral Supplements with tray: Ensure Plus High Protein- Vanilla; BREAKFAST/LUNCH/DINNER; 1 Each  adult custom parenteral nutrition  DIET DIABETIC Calorie amount: CC 2400-2600     Height Used for Calculations: 175.3 cm (5' 9.02")  Weight Used For Calculations: 75.9 kg (167 lb 5.3 oz) (standing weight on 3/3)  BMI (kg/m2): 24.75  BMI Assessment: BMI 18.5-24.9: normal  Ideal Body Weight (IBW) (kg): 73.73  % Ideal Body Weight: 102.94     Usual Body Weight: 81.3 kg (179 lb 3.7 oz)  Usual Body Weight: 81.3 kg (179 lb 3.7 oz)  Weight Loss:  (intentional weight loss)    Re-assessed needs if applicable:    Energy Calorie Requirements: 2100-2300 cal (28-30 cal/75.9 kg) per day   Protein Requirements (gms/day): 88-110 g prot (1.2-1.5 g/73.7 kg) per day   Fluid Requirements: 2100-2300 ml (28-30 ml/75.9 k) per day     Comments: 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 He had been placed on bowel rest and TPN initiated. PICC line removed due to yeast growth .  Patient states he is feeling better this am, bowel function returning, he is motivated to slowly increasing po intake of meals and ensure plus.     Plan/Interventions : Continue Diabetic diet                                   Continue Ensure Plus-vanilla                                   Monitor blood sugars and transition ensure plus to ensure high  protein if consistently >200. Currently blood sugars <150                                   Encourage small frequent meals and sips of ensure plus                                   Monitor tolerance to po diet                                   Monitor weight 2x/week for trends                                   RD will continue to follow for any further nutrition needs.         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 (updated)      Levon Hedger, RD/LD  04/26/2021, 12:19  Pager # [phone]

## 2021-04-26 NOTE — Care Management Notes (Signed)
[facility] [address]  Care Management Note    Patient Name: [name]  Date of Birth: 1945/06/25  Sex: male  Date/Time of Admission: 04/03/2021  2:01 PM  Room/Bed: 14/A  Payor: MEDICARE / Plan: MEDICARE PART A AND B / Product Type: Medicare /    LOS: 23 days   Primary Care Providers:  Blanco (General)    Admitting Diagnosis:  Intra abdominal hemorrhage [R58]    Assessment:      04/26/21 1505   Assessment Details   Assessment Type Continued Assessment   Date of Care Management Update 04/26/21   Date of Next DCP Update 04/28/21   Care Management Plan   Discharge Planning Status plan in progress   Projected Discharge Date 04/28/21   Discharge plan discussed with: Patient;Spouse   Patient choice offered to patient/family yes   Discharge Needs Assessment   Discharge Facility/Level of Care Needs Acute Rehab Placement/Return (not psych)(code 62)     Per chart review, continue Daptomycin and meropenum (stop date 4/7), PT/OT recs IPR on DC.    MSW received call from [facility] ([facility] stating [facility] and cannot [facility].  MSW met with pt and wife to provide list of IPR's near their home for consideration.  Will follow for DC planning.    Discharge Plan:  Acute Rehab Placement/Return (not psych) (code 107)    The patient will continue to be evaluated for developing discharge needs.     Case Manager: Darron Doom., SOCIAL WORKER  Phone: [phone]

## 2021-04-26 NOTE — Progress Notes (Signed)
[facility]  Surgical Oncology  Progress Note      [name], [name], 76 y.o. male  Date of Birth:  07-08-1945  Date of Admission:  04/03/2021  Date of service: 04/26/2021    Assessment:  This is a 76 y.o. 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 He underwent massive transfusion protocol and ex lap on 04/03/21 at an outside facility with ligation of a bleeding vessel in the liver bed. Patient was subsequently transferred to [facility] postoperaively, intubated. Patient was subsequently extubated later that day on 3/4. 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      Plan/Recommendations:  - CT abdomen/pelvis showed increased fluid and inflammation along the midline abdominal wall incision when compared to previous scan  - Plan to consult ID today  - PICC removed yesterday  - Diet: Advance to diabetic diet  - Restarted fluid  - Peripancreatic fluid collections   - GI scope on 3/23 unable to stent PD duct, patient with pancreatic divisim              - IR consulted; RUQ drain placement and empiric embolization 3/10              - Blood cultures 3/9: + pseudomonas    - Cultures form R JP: Pseudomonas and enterococcus              - Repeat Blood cultures 3/12 negative   - ID consulted; continue Daptomycin and meropenum (stop date 4/7)  - PE   - s/p IVC filter placement 3/17   - Aggressive pulm toilet, supp off O2   - Resume low dose heparin  - IVF: Plasmalyte 162m/hr  - Midline fistula with ostomy bag added to monitor output  - OOB TID  - PT/OT ordered    Subjective:   Feeling better this am. Still some abdominal soreness. Passing flatus, had one BM. Denies fevers, chills, nausea, and vomiting.    Objective  Filed Vitals:    04/25/21 2058 04/25/21 2333 04/25/21 2334 04/26/21 0305   BP:   123/77 (!) 140/82   Pulse:   89 85    Resp: '16 15  16   '$ Temp:   36.7 C (98 F) 36.7 C (98 F)   SpO2:   96% 95%     Physical Exam:   GEN:  AOx4, resting in bed, no acute distress  PULM: Normal respiratory effort.   CV:  Tachycardic, regular rhythm   ABD:   Abdomen soft, minimal ttp RUQ, nondistended. JP in place in RUQ,  Lower midline incision with feculent drainage present with ostomy bag over it.  Pigtail catheter present with SS output.  MS: Atraumatic. Moves all extremities.  NEURO:   Alert and oriented to person, place and time.  Cranial nerves grossly intact.    Integumentary:  Pink, warm, and dry  PSYCHOSOCIAL: Pleasant.  Normal affect.     Labs  Results for orders placed or performed during the hospital encounter of 04/03/21 (from the past 24 hour(s))   PTT (PARTIAL THROMBOPLASTIN TIME) - ONCE   Result Value Ref Range    APTT 68.3 (H) 24.2 - 37.5 seconds    Narrative    Therapeutic range for unfractionated heparin is 60-100 seconds.   PTT (PARTIAL THROMBOPLASTIN TIME)   Result Value Ref  Range    APTT 68.4 (H) 24.2 - 37.5 seconds    Narrative    Therapeutic range for unfractionated heparin is 60-100 seconds.   PTT (PARTIAL THROMBOPLASTIN TIME) - ONCE   Result Value Ref Range    APTT 70.3 (H) 24.2 - 37.5 seconds    Narrative    Therapeutic range for unfractionated heparin is 60-100 seconds.   CREATINE KINASE (CK), TOTAL, SERUM   Result Value Ref Range    CREATINE KINASE 16 (L) 45 - 225 U/L   HEPATIC FUNCTION PANEL   Result Value Ref Range    ALBUMIN 1.6 (L) 3.4 - 4.8 g/dL     ALKALINE PHOSPHATASE 107 45 - 115 U/L    ALT (SGPT) 26 10 - 55 U/L    AST (SGOT)  27 8 - 45 U/L    BILIRUBIN TOTAL 0.4 0.3 - 1.3 mg/dL    BILIRUBIN DIRECT 0.2 0.1 - 0.4 mg/dL    PROTEIN TOTAL 5.5 (L) 6.0 - 8.0 g/dL   C-REACTIVE PROTEIN(CRP),INFLAMMATION   Result Value Ref Range    CRP INFLAMMATION 137.8 (H) <8.0 mg/L   CBC/DIFF    Narrative    The following orders were created for panel order CBC/DIFF.  Procedure                               Abnormality         Status                      ---------                               -----------         ------                     CBC WITH DIFF[[id number]]                Abnormal            Final result               MANUAL DIFF AND MORPHOLO.Marland KitchenMarland Kitchen[[id number]]  Abnormal            Final result                 Please view results for these tests on the individual orders.   BASIC METABOLIC PANEL   Result Value Ref Range    SODIUM 136 136 - 145 mmol/L    POTASSIUM 4.0 3.5 - 5.1 mmol/L    CHLORIDE 101 96 - 111 mmol/L    CO2 TOTAL 28 23 - 31 mmol/L    ANION GAP 7 4 - 13 mmol/L    CALCIUM 8.6 (L) 8.8 - 10.2 mg/dL    GLUCOSE 97 65 - 125 mg/dL    BUN 13 8 - 25 mg/dL    CREATININE 0.61 (L) 0.75 - 1.35 mg/dL    BUN/CREA RATIO 21 6 - 22    ESTIMATED GFR >90 >=60 mL/min/BSA   PHOSPHORUS   Result Value Ref Range    PHOSPHORUS 2.0 (L) 2.3 - 4.0 mg/dL   MAGNESIUM   Result Value Ref Range    MAGNESIUM 2.2 1.8 - 2.6 mg/dL   PTT (PARTIAL THROMBOPLASTIN TIME) - ONCE   Result Value Ref Range    APTT 102.8 (H) 24.2 - 37.5 seconds  Narrative    Therapeutic range for unfractionated heparin is 60-100 seconds.   CBC WITH DIFF   Result Value Ref Range    WBC 12.0 (H) 3.7 - 11.0 x10^3/uL    RBC 2.81 (L) 4.50 - 6.10 x10^6/uL    HGB 7.9 (L) 13.4 - 17.5 g/dL    HCT 26.2 (L) 38.9 - 52.0 %    MCV 93.2 78.0 - 100.0 fL    MCH 28.1 26.0 - 32.0 pg    MCHC 30.2 (L) 31.0 - 35.5 g/dL    RDW-CV 18.1 (H) 11.5 - 15.5 %    PLATELETS 383 150 - 400 x10^3/uL    MPV 10.9 8.7 - 12.5 fL   MANUAL DIFF AND MORPHOLOGY-SYSMEX   Result Value Ref Range    NEUTROPHIL % 75 %    LYMPHOCYTE %  15 %    MONOCYTE % 5 %    EOSINOPHIL % 4 %    BASOPHIL % 1 %    NEUTROPHIL # 9.00 (H) 1.50 - 7.70 x10^3/uL    LYMPHOCYTE # 1.80 1.00 - 4.80 x10^3/uL    MONOCYTE # 0.60 0.20 - 1.10 x10^3/uL    EOSINOPHIL # 0.48 <=0.50 x10^3/uL    BASOPHIL # 0.12 <=0.20 x10^3/uL    NRBC FROM MANUAL DIFF 7 per 100 WBC    RBC MORPHOLOGY Normal RBC and PLT Morphology    POC BLOOD GLUCOSE (RESULTS)   Result Value Ref Range    GLUCOSE,  POC 132 (H) 70 - 105 mg/dl   POC BLOOD GLUCOSE (RESULTS)   Result Value Ref Range    GLUCOSE, POC 136 (H) 70 - 105 mg/dl   POC BLOOD GLUCOSE (RESULTS)   Result Value Ref Range    GLUCOSE, POC 115 (H) 70 - 105 mg/dl   POC BLOOD GLUCOSE (RESULTS)   Result Value Ref Range    GLUCOSE, POC 98 70 - 105 mg/dl       I/O:  Date 04/25/21 0700 - 04/26/21 0659 04/26/21 0700 - 04/27/21 0659   Shift 0700-1459 1500-2259 2300-0659 24 Hour Total 0700-1459 1500-2259 2300-0659 24 Hour Total   INTAKE   P.O.  100 60 160         Oral  100 60 160       I.V.(mL/kg/hr) 567.5(0.89) 400(0.63) 800(1.26) 1767.5(0.93)         Volume Infused (adult custom parenteral nutrition) 567.5   567.5         Volume Infused (electrolyte-A (PLASMALYTE-A) premix infusion)  984 250 7680       IV Piggyback 105 66  171 105   105     Volume (DAPTOmycin (CUBICIN) 800 mg in NS 50 mL IVPB)  66  66         Volume (micafungin (MYCAMINE) 100 mg in NS 100 mL IVPB) 105   105 105   105   Shift Total(mL/kg) 672.5(8.46) 566(7.12) 860(10.82) 1749.4(49.6) 105(1.32)   105(1.32)   OUTPUT   Urine(mL/kg/hr) 1500(2.36) 600(0.94) 850(1.34) 2950(1.55)         Urine (Voided) 1500 760 265 6180         Urine Occurrence 0 x   0 x       Emesis             Emesis Occurrence 0 x 0 x 0 x 0 x       Drains 210 10 172.5 392.5         JP Drain Output ([employer] Drain Right;Upper Abdomen)  10 10 2.5 22.5         Drain Output (Drain (Miscellaneous) #80F pigtail catheter for RLQ fluid collection Lower;Right;Anterior Abdomen) 170  170 340         Drain Output (Drain (Miscellaneous) peds ostomy bag Lower;Medial Abdomen) 30   30       Other             Other 0 x 0 x 0 x 0 x       Stool             Stool Occurrence 1 x 0 x 0 x 1 x       Shift Total(mL/kg) 1710(21.51) 610(7.67) 1022.5(12.86) 3342.5(42.04)       Weight (kg) 79.5 79.5 79.5 79.5 79.5 79.5 79.5 79.5       Radiology  Results for orders placed or performed during the hospital encounter of 04/03/21 (from the past 72 hour(s))   XR AP MOBILE  CHEST     Status: None    Narrative    CALVEN GILKES Bordwell  Male, 76 years old.    XR AP MOBILE CHEST performed on 04/24/2021 8:28 AM.    REASON FOR EXAM:  leukocytosis    TECHNIQUE: 1 views/1 images submitted for interpretation.    COMPARISON:  CT chest, abdomen, and pelvis dated 04/20/2021.    FINDINGS:  There is a right PICC with the tip projecting over the upper right atrium. Two separate upper abdominal drains are noted.    The heart is enlarged, unchanged. There is small left pleural fluid and bibasilar opacities. No definite pneumothorax is seen though the right lung apex is not entirely included within the field-of-view. No new consolidation is present.      Impression    Bibasilar opacities with small left pleural fluid. This appears stable from the CT on 04/20/2021 where this appeared as atelectasis. No new consolidation.   CT ABDOMEN PELVIS W IV CONTRAST     Status: None    Narrative    JOHNATHON OLDEN Zinni  Male, 76 years old.    CT ABDOMEN PELVIS W IV CONTRAST performed on 04/25/2021 9:48 AM.    REASON FOR EXAM:  Increased leukocytosis    TECHNIQUE: Volumetric images were obtained from the lung bases through the pelvis with axial, coronal, and sagittal reformatting.    RADIATION DOSE: 401.99 mGy.cm    COMPARISON: 04/20/2021    FINDINGS:   LUNG BASES: Trace bilateral pleural fluid. Right basilar consolidation with air bronchograms, slightly improved from the prior.    LIVER: Liver is normal in size. Perihepatic fluid collection has near completely resolved with pigtail drain in stable positioning. Hepatic and portal veins are patent.    BILIARY SYSTEM/GALLBLADDER: Gallbladder is physiologically distended. Pericholecystic portion of the fluid collection has decreased in size. No biliary duct dilation.    PANCREAS: Postoperative changes from distal pancreatectomy. Rim-enhancing fluid collection within the surgical bed is stable in size, measuring approximately 5.6 x 2.6 cm, although likely not adequately measured  due to metallic streak artifact.    KIDNEY/URETERS/BLADDER: Kidneys are symmetric in enhancement. No hydronephrosis. Multiple bilateral simple renal cysts. Ureters are of normal course and caliber. Bladder is distended without perivesicular stranding.    ADRENALS: Normal in size and unremarkable in appearance.    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 Left upper quadrant fluid collection with surgical drain in place is similar in appearance to the prior study. Fluid collection measures 11.4 x 8.1 cm.    BOWEL:  The bowel is nondilated. No evidence for obstruction. Intraluminal contrast is seen throughout the majority of the colon. Scattered diverticulosis. Stomach is mostly decompressed. There is some stranding along the gastroduodenal junction, likely postoperative. Bowel anastomosis just superior to the bladder appears widely patent.    VASCULATURE/LYMPH NODES: Aorta is normal in caliber. Scattered atherosclerotic calcifications are present. IVC filter in place. Celiac axis at the origin is patent however due to artifact the remaining celiac axis is not well assessed.    REPRODUCTIVE ORGANS: Postsurgical changes within the prominent prostate.    PERITONEAL CAVITY: Near complete resolution of postoperative pneumoperitoneum. Multiple fluid collections involving the liver, splenectomy bed, and distal pancreatectomy site are discussed above. No new fluid collection is identified.    BONES/SOFT TISSUES: Midline abdominal incision again seen with underlying fluid/inflammatory change, appears more prominent than on the prior study. Multilevel degenerative changes of the spine. Vertebral body heights are maintained. Stable inflammation and fluid within the right groin, likely related to recent central venous access.        Impression    1.Increased fluid and inflammation along the midline abdominal wall incision when compared to 04/20/2021, may represent developing soft tissue infection. No contrast within this  collection to suggest colocutaneous fistula, which was identified on the prior study.  2.Stable fluid collections within the splenectomy bed and pancreatic resection cavity.  3.Interval improvement of perihepatic fluid collection with pigtail drain remaining in place.  4.Persistent but improving right lower lobe consolidation.  5.Celiac origin is patent however the remaining celiac artery is not well assessed due to metallic well artifact.         Current Medications:  Current Facility-Administered Medications   Medication Dose Route Frequency   . acetaminophen (TYLENOL) tablet  650 mg Oral Q4H   . [Held by provider] adult custom parenteral nutrition   Intravenous Continuous   . atorvastatin (LIPITOR) tablet  40 mg Oral Daily with Breakfast   . benzonatate (TESSALON) capsule  100 mg Oral Q8H PRN   . D5W 250 mL flush bag   Intravenous Q15 Min PRN   . DAPTOmycin (CUBICIN) 800 mg in NS 50 mL IVPB  10 mg/kg (Adjusted) Intravenous Q24H   . docusate sodium (COLACE) capsule  100 mg Oral 2x/day   . electrolyte-A (PLASMALYTE-A) premix infusion   Intravenous Continuous   . elvitegravir-cobicistat-emtricitrabine-tenofovir (GENVOYA) 150-150-200-10 mg per tablet  1 Tablet Oral Daily   . gabapentin (NEURONTIN) capsule  300 mg Oral 3x/day   . heparin 25,000 units in NS 250 mL infusion  12 Units/kg/hr (Adjusted) Intravenous Continuous   . HYDROmorphone (DILAUDID) 1 mg/mL injection  0.2 mg Intravenous Q3H PRN   . iopamidol (ISOVUE-300) 61% infusion  100 mL Intravenous Give in GI   . meropenem (MERREM) 1 g in NS 100 mL IVPB  1 g Intravenous Q8H   . methocarbamol (ROBAXIN) tablet  500 mg Oral 4x/day   . micafungin (MYCAMINE) 100 mg in NS 100 mL IVPB  100 mg Intravenous Q24H   . multivitamin-minerals-folic acid-lycopene-lutein (CERTAVITE SENIOR) tablet  1 Tablet Oral Q24H   . NS 250 mL flush bag   Intravenous Q15 Min PRN   . NS flush syringe  2-6 mL Intracatheter Q8HRS   . NS flush syringe  2-6 mL Intracatheter Q1 MIN PRN   . NS flush  syringe  10-30 mL Intracatheter Q8HRS   . NS flush syringe  20-30 mL Intracatheter Q1 MIN PRN   . NS flush syringe  10-30 mL Intracatheter Q8HRS   .  NS flush syringe  20-30 mL Intracatheter Q1 MIN PRN   . ondansetron (ZOFRAN) 2 mg/mL injection  4 mg Intravenous Q6H PRN   . oxyCODONE (ROXICODONE) immediate release tablet  5 mg Oral Q4H PRN   . oxyCODONE (ROXICODONE) immediate releaste tablet  10 mg Oral Q4H PRN   . pantoprazole (PROTONIX) delayed release tablet  40 mg Oral Q12H   . prochlorperazine (COMPAZINE) 5 mg/mL injection  10 mg Intravenous Q6H PRN   . sennosides-docusate sodium (SENOKOT-S) 8.6-'50mg'$  per tablet  1 Tablet Oral 2x/day   . sodium phosphate 30 mmol in D5W 100 mL IVPB  30 mmol Intravenous Once   . SSIP insulin R human (HUMULIN R) 100 units/mL injection  0-12 Units Subcutaneous 4x/day PRN         Micheline Chapman, MD  Department of Family Medicine, PGY-1  Pager: [phone]

## 2021-04-27 DIAGNOSIS — B49 Unspecified mycosis: Secondary | ICD-10-CM

## 2021-04-27 LAB — POC BLOOD GLUCOSE (RESULTS)
GLUCOSE, POC: 115 mg/dl — ABNORMAL HIGH (ref 70–105)
GLUCOSE, POC: 167 mg/dl — ABNORMAL HIGH (ref 70–105)
GLUCOSE, POC: 180 mg/dl — ABNORMAL HIGH (ref 70–105)
GLUCOSE, POC: 183 mg/dl — ABNORMAL HIGH (ref 70–105)

## 2021-04-27 LAB — MANUAL DIFF AND MORPHOLOGY-SYSMEX
BASOPHIL #: <0.1 10*3/uL (ref <=0.20)
BASOPHIL %: 0 %
EOSINOPHIL #: 0.37 10*3/uL (ref <=0.50)
EOSINOPHIL %: 4 %
LYMPHOCYTE #: 1.58 10*3/uL (ref 1.00–4.80)
LYMPHOCYTE %: 17 %
MONOCYTE #: 0.65 10*3/uL (ref 0.20–1.10)
MONOCYTE %: 7 %
NEUTROPHIL #: 6.7 10*3/uL (ref 1.50–7.70)
NEUTROPHIL %: 72 %
NRBC FROM MANUAL DIFF: 4 per 100 WBC

## 2021-04-27 LAB — BASIC METABOLIC PANEL
ANION GAP: 6 mmol/L (ref 4–13)
BUN/CREA RATIO: 17 (ref 6–22)
BUN: 12 mg/dL (ref 8–25)
CALCIUM: 8.2 mg/dL — ABNORMAL LOW (ref 8.8–10.2)
CHLORIDE: 99 mmol/L (ref 96–111)
CO2 TOTAL: 27 mmol/L (ref 23–31)
CREATININE: 0.69 mg/dL — ABNORMAL LOW (ref 0.75–1.35)
ESTIMATED GFR: >90 mL/min/BSA (ref >=60)
GLUCOSE: 112 mg/dL (ref 65–125)
POTASSIUM: 4 mmol/L (ref 3.5–5.1)
SODIUM: 132 mmol/L — ABNORMAL LOW (ref 136–145)

## 2021-04-27 LAB — ECG 12-LEAD
Atrial Rate: 91 {beats}/min
Calculated P Axis: 49 degrees
Calculated R Axis: 21 degrees
Calculated T Axis: 41 degrees
PR Interval: 140 ms
QRS Duration: 78 ms
QT Interval: 386 ms
QTC Calculation: 474 ms
Ventricular rate: 91 {beats}/min

## 2021-04-27 LAB — CBC WITH DIFF
HCT: 24.7 % — ABNORMAL LOW (ref 38.9–52.0)
HGB: 7.7 g/dL — ABNORMAL LOW (ref 13.4–17.5)
MCH: 28.7 pg (ref 26.0–32.0)
MCHC: 31.2 g/dL (ref 31.0–35.5)
MCV: 92.2 fL (ref 78.0–100.0)
MPV: 11.7 fL (ref 8.7–12.5)
PLATELETS: 370 10*3/uL (ref 150–400)
RBC: 2.68 10*6/uL — ABNORMAL LOW (ref 4.50–6.10)
RDW-CV: 17.6 % — ABNORMAL HIGH (ref 11.5–15.5)
WBC: 9.3 10*3/uL (ref 3.7–11.0)

## 2021-04-27 LAB — PTT (PARTIAL THROMBOPLASTIN TIME)
APTT: 59.6 seconds — ABNORMAL HIGH (ref 24.2–37.5)
APTT: 75.4 seconds — ABNORMAL HIGH (ref 24.2–37.5)
APTT: 83.1 seconds — ABNORMAL HIGH (ref 24.2–37.5)
APTT: 76.9 seconds — ABNORMAL HIGH (ref 24.2–37.5)

## 2021-04-27 LAB — MAGNESIUM: MAGNESIUM: 2.1 mg/dL (ref 1.8–2.6)

## 2021-04-27 LAB — PHOSPHORUS: PHOSPHORUS: 2.2 mg/dL — ABNORMAL LOW (ref 2.3–4.0)

## 2021-04-27 MED ORDER — FLUCONAZOLE 400 MG/200 ML IN SOD. CHLORIDE(ISO) INTRAVENOUS PIGGYBACK
400.0000 mg | INJECTION | INTRAVENOUS | Status: DC
Start: 2021-04-28 — End: 2021-05-02
  Administered 2021-04-28: 400 mg via INTRAVENOUS
  Administered 2021-04-28: 0 mg via INTRAVENOUS
  Administered 2021-04-29: 400 mg via INTRAVENOUS
  Administered 2021-04-29: 0 mg via INTRAVENOUS
  Administered 2021-04-30: 400 mg via INTRAVENOUS
  Administered 2021-04-30 – 2021-05-01 (×2): 0 mg via INTRAVENOUS
  Administered 2021-05-01: 400 mg via INTRAVENOUS
  Administered 2021-05-02: 0 mg via INTRAVENOUS
  Administered 2021-05-02: 400 mg via INTRAVENOUS
  Filled 2021-04-27 (×5): qty 200

## 2021-04-27 NOTE — Nurses Notes (Signed)
Latest Reference Range & Units 04/27/21 13:49   aPTT 24.2 - 37.5 seconds 83.1 (H)   (H): Data is abnormally high  Hep drip decreased by 1 U/kg/hr to 20 U/kg/hr per protocol. Next aPTT @ 20:30.

## 2021-04-27 NOTE — Care Plan (Signed)
Medical Nutrition Therapy Calorie Count Note  I: Estimated Needs:      Energy Calorie Requirements: 7619-5093 cal (28-30 cal/75.9 kg) per day   Protein Requirements (gms/day): 88-110 g prot (1.2-1.5 g/73.7 kg) per day     Calorie count ordered as patient has transitioned off TPN due to need to remove PICC line. Patient has had minimal intake since discontinuation.  He has been receiving Ensure Plus Vanilla per his choice but has only taken a few sips. Patient states that food tastes like "cardboard" and he cant swallow. He does like pudding, yogurt and ice cream.  Reviewed importance of increasing daily caloric and protein intake and that RD will be monitoring a calorie count x 3 days.  Patient agreed to try to increase his intake by sipping on ensure plus(350kcal/20gm pro ea) throughout the day, he also agreed to try magic cups BID (290kcal/9gm pro ea)  Will continue to follow and encourage po intake.

## 2021-04-27 NOTE — Nurses Notes (Signed)
Latest Reference Range & Units 04/27/21 06:30   aPTT 24.2 - 37.5 seconds 75.4 (H)   (H): Data is abnormally high    Hep drip decreased by 1 U/kg/hr per protocol. Hep now running @ 21 U/kg/hr. Next PTT @ 13:30.

## 2021-04-27 NOTE — Nurses Notes (Signed)
Latest Reference Range & Units 04/27/21 20:20   aPTT 24.2 - 37.5 seconds 76.9 (H)     Heparin gtt decreased by 1 unit/kg/h per adult low intensity heparin dosing chart. Heparin gtt now running at 19 unit/kg/h. Next PTT at 0500. Will monitor.

## 2021-04-27 NOTE — Progress Notes (Signed)
[facility]  Surgical Oncology  Progress Note      [name], [name], 76 y.o. male  Date of Birth:  Mar 16, 1945  Date of Admission:  04/03/2021  Date of service: 04/27/2021    Assessment:  This is a 76 y.o. 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 He underwent massive transfusion protocol and ex lap on 04/03/21 at an outside facility with ligation of a bleeding vessel in the liver bed. Patient was subsequently transferred to [facility] postoperaively, intubated. Patient was subsequently extubated later that day on 3/4. 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      Plan/Recommendations:  - I/D consult for fungemia   -F/U blood cultures   - Continue micafungin, daptomycin, and meropenem  - Diet: Continue diabetic diet   Calorie count started 04/27/21  - Restarted fluid  - Peripancreatic fluid collections   - GI scope on 3/23 unable to stent PD duct, patient with pancreatic divisim              - IR consulted; RUQ drain placement and empiric embolization 3/10              - Blood cultures 3/9: + pseudomonas    - Cultures form R JP: Pseudomonas and enterococcus              - Repeat Blood cultures 3/12 negative   - ID consulted; continue Daptomycin and meropenum (stop date 4/7)  - PE   - s/p IVC filter placement 3/17   - Aggressive pulm toilet, supp off O2   - Continue low dose heparin  - IVF: Plasmalyte 50 ml/hr  - Midline fistula with ostomy bag added to monitor output  - OOB TID  - PT/OT ordered    Subjective:   NAEO. Rested well overnight.     Objective  Filed Vitals:    04/26/21 2249 04/26/21 2250 04/27/21 0317 04/27/21 0319   BP:  115/74 119/70    Pulse: 97   99   Resp:  17 18    Temp:  36.6 C (97.9 F) 37 C (98.6 F)    SpO2:  93% 92%      Physical Exam:   GEN:  AOx4, resting in bed, no acute distress  PULM:  Normal respiratory effort.   CV:  Tachycardic, regular rhythm   ABD:   Abdomen soft, minimal ttp RUQ, nondistended. JP in place in RUQ,  Lower midline incision with feculent drainage present with ostomy bag.  Pigtail catheter present with SS output.  MS: Atraumatic. Moves all extremities.  NEURO:   Alert and oriented to person, place and time.  Cranial nerves grossly intact.    Integumentary:  Pink, warm, and dry  PSYCHOSOCIAL: Pleasant.  Normal affect.     Labs  Results for orders placed or performed during the hospital encounter of 04/03/21 (from the past 24 hour(s))   PTT (PARTIAL THROMBOPLASTIN TIME) - ONCE   Result Value Ref Range    APTT 55.5 (H) 24.2 - 37.5 seconds    Narrative    Therapeutic range for unfractionated heparin is 60-100 seconds.   PTT (PARTIAL THROMBOPLASTIN TIME)   Result Value Ref Range    APTT 38.1 (H) 24.2 - 37.5 seconds    Narrative    Therapeutic range for unfractionated heparin is  60-100 seconds.   PTT (PARTIAL THROMBOPLASTIN TIME) - ONCE   Result Value Ref Range    APTT 59.6 (H) 24.2 - 37.5 seconds    Narrative    Therapeutic range for unfractionated heparin is 60-100 seconds.   CBC/DIFF    Narrative    The following orders were created for panel order CBC/DIFF.  Procedure                               Abnormality         Status                     ---------                               -----------         ------                     CBC WITH ZOXW[[id number]]                Abnormal            Preliminary result           Please view results for these tests on the individual orders.   BASIC METABOLIC PANEL   Result Value Ref Range    SODIUM 132 (L) 136 - 145 mmol/L    POTASSIUM 4.0 3.5 - 5.1 mmol/L    CHLORIDE 99 96 - 111 mmol/L    CO2 TOTAL 27 23 - 31 mmol/L    ANION GAP 6 4 - 13 mmol/L    CALCIUM 8.2 (L) 8.8 - 10.2 mg/dL    GLUCOSE 112 65 - 125 mg/dL    BUN 12 8 - 25 mg/dL    CREATININE 0.69 (L) 0.75 - 1.35 mg/dL    BUN/CREA RATIO 17 6 - 22    ESTIMATED GFR >90 >=60 mL/min/BSA    PHOSPHORUS   Result Value Ref Range    PHOSPHORUS 2.2 (L) 2.3 - 4.0 mg/dL   MAGNESIUM   Result Value Ref Range    MAGNESIUM 2.1 1.8 - 2.6 mg/dL   CBC WITH DIFF   Result Value Ref Range    WBC 9.3 3.7 - 11.0 x10^3/uL    RBC 2.68 (L) 4.50 - 6.10 x10^6/uL    HGB 7.7 (L) 13.4 - 17.5 g/dL    HCT 24.7 (L) 38.9 - 52.0 %    MCV 92.2 78.0 - 100.0 fL    MCH 28.7 26.0 - 32.0 pg    MCHC 31.2 31.0 - 35.5 g/dL    RDW-CV 17.6 (H) 11.5 - 15.5 %    PLATELETS 370 150 - 400 x10^3/uL    MPV 11.7 8.7 - 12.5 fL   PTT (PARTIAL THROMBOPLASTIN TIME) - ONCE   Result Value Ref Range    APTT 75.4 (H) 24.2 - 37.5 seconds    Narrative    Therapeutic range for unfractionated heparin is 60-100 seconds.   POC BLOOD GLUCOSE (RESULTS)   Result Value Ref Range    GLUCOSE, POC 157 (H) 70 - 105 mg/dl   POC BLOOD GLUCOSE (RESULTS)   Result Value Ref Range    GLUCOSE, POC 144 (H) 70 - 105 mg/dl   POC BLOOD GLUCOSE (RESULTS)   Result Value Ref Range    GLUCOSE, POC 122 (H) 70 - 105 mg/dl  POC BLOOD GLUCOSE (RESULTS)   Result Value Ref Range    GLUCOSE, POC 115 (H) 70 - 105 mg/dl       I/O:  Date 04/26/21 0700 - 04/27/21 0659 04/27/21 0700 - 04/28/21 0659   Shift 0700-1459 1500-2259 2300-0659 24 Hour Total 0700-1459 1500-2259 2300-0659 24 Hour Total   INTAKE   P.O.  120 60 180         Oral  120 60 180       I.V.(mL/kg/hr)  200(0.31) 350(0.55) 550(0.29)         Volume Infused (electrolyte-A (PLASMALYTE-A) premix infusion)  200 350 550       IV Piggyback 217 66  283         Volume (DAPTOmycin (CUBICIN) 800 mg in NS 50 mL IVPB)  66  66         Volume (micafungin (MYCAMINE) 100 mg in NS 100 mL IVPB) 105   105         Volume (sodium phosphate 30 mmol in D5W 100 mL IVPB) 112   112       Shift Total(mL/kg) 217(2.73) 386(4.86) 410(5.16) 7035(00.93)       OUTPUT   Urine(mL/kg/hr) 550(0.86) 1300(2.04) 725(1.14) 8182(9.93)         Urine (Voided) 550 1300 725 2575         Urine Occurrence 0 x 2 x  2 x       Emesis             Emesis Occurrence 0 x   0 x       Drains  120 170 40 330         JP Drain Output ([place] Drain Right;Upper Abdomen) 10 5 0 15         Drain Output (Drain (Miscellaneous) #33F pigtail catheter for RLQ fluid collection Lower;Right;Anterior Abdomen) 110 90 40 240         Drain Output (Drain (Miscellaneous) peds ostomy bag Lower;Medial Abdomen)  75 0 75       Other             Other 0 x   0 x       Stool             Stool Occurrence 0 x  1 x 1 x       Shift Total(mL/kg) 716(9.67) 8938(10.17) 765(9.62) 5102(58.52)       Weight (kg) 79.5 79.5 79.5 79.5 79.5 79.5 79.5 79.5       Radiology  Results for orders placed or performed during the hospital encounter of 04/03/21 (from the past 72 hour(s))   XR AP MOBILE CHEST     Status: None    Narrative    AURON TADROS Matheny  Male, 76 years old.    XR AP MOBILE CHEST performed on 04/24/2021 8:28 AM.    REASON FOR EXAM:  leukocytosis    TECHNIQUE: 1 views/1 images submitted for interpretation.    COMPARISON:  CT chest, abdomen, and pelvis dated 04/20/2021.    FINDINGS:  There is a right PICC with the tip projecting over the upper right atrium. Two separate upper abdominal drains are noted.    The heart is enlarged, unchanged. There is small left pleural fluid and bibasilar opacities. No definite pneumothorax is seen though the right lung apex is not entirely included within the field-of-view. No new consolidation is present.      Impression    Bibasilar opacities with  small left pleural fluid. This appears stable from the CT on 04/20/2021 where this appeared as atelectasis. No new consolidation.   CT ABDOMEN PELVIS W IV CONTRAST     Status: None    Narrative    ZERIC BARANOWSKI Pinkus  Male, 76 years old.    CT ABDOMEN PELVIS W IV CONTRAST performed on 04/25/2021 9:48 AM.    REASON FOR EXAM:  Increased leukocytosis    TECHNIQUE: Volumetric images were obtained from the lung bases through the pelvis with axial, coronal, and sagittal reformatting.    RADIATION DOSE: 401.99 mGy.cm    COMPARISON: 04/20/2021    FINDINGS:   LUNG  BASES: Trace bilateral pleural fluid. Right basilar consolidation with air bronchograms, slightly improved from the prior.    LIVER: Liver is normal in size. Perihepatic fluid collection has near completely resolved with pigtail drain in stable positioning. Hepatic and portal veins are patent.    BILIARY SYSTEM/GALLBLADDER: Gallbladder is physiologically distended. Pericholecystic portion of the fluid collection has decreased in size. No biliary duct dilation.    PANCREAS: Postoperative changes from distal pancreatectomy. Rim-enhancing fluid collection within the surgical bed is stable in size, measuring approximately 5.6 x 2.6 cm, although likely not adequately measured due to metallic streak artifact.    KIDNEY/URETERS/BLADDER: Kidneys are symmetric in enhancement. No hydronephrosis. Multiple bilateral simple renal cysts. Ureters are of normal course and caliber. Bladder is distended without perivesicular stranding.    ADRENALS: Normal in size and unremarkable in appearance.    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 Left upper quadrant fluid collection with surgical drain in place is similar in appearance to the prior study. Fluid collection measures 11.4 x 8.1 cm.    BOWEL: The bowel is nondilated. No evidence for obstruction. Intraluminal contrast is seen throughout the majority of the colon. Scattered diverticulosis. Stomach is mostly decompressed. There is some stranding along the gastroduodenal junction, likely postoperative. Bowel anastomosis just superior to the bladder appears widely patent.    VASCULATURE/LYMPH NODES: Aorta is normal in caliber. Scattered atherosclerotic calcifications are present. IVC filter in place. Celiac axis at the origin is patent however due to artifact the remaining celiac axis is not well assessed.    REPRODUCTIVE ORGANS: Postsurgical changes within the prominent prostate.    PERITONEAL CAVITY: Near complete resolution of postoperative pneumoperitoneum. Multiple fluid  collections involving the liver, splenectomy bed, and distal pancreatectomy site are discussed above. No new fluid collection is identified.    BONES/SOFT TISSUES: Midline abdominal incision again seen with underlying fluid/inflammatory change, appears more prominent than on the prior study. Multilevel degenerative changes of the spine. Vertebral body heights are maintained. Stable inflammation and fluid within the right groin, likely related to recent central venous access.        Impression    1.Increased fluid and inflammation along the midline abdominal wall incision when compared to 04/20/2021, may represent developing soft tissue infection. No contrast within this collection to suggest colocutaneous fistula, which was identified on the prior study.  2.Stable fluid collections within the splenectomy bed and pancreatic resection cavity.  3.Interval improvement of perihepatic fluid collection with pigtail drain remaining in place.  4.Persistent but improving right lower lobe consolidation.  5.Celiac origin is patent however the remaining celiac artery is not well assessed due to metallic well artifact.         Current Medications:  Current Facility-Administered Medications   Medication Dose Route Frequency   . acetaminophen (TYLENOL) tablet  500 mg Oral Q4H   .  atorvastatin (LIPITOR) tablet  40 mg Oral Daily with Breakfast   . benzonatate (TESSALON) capsule  100 mg Oral Q8H PRN   . D5W 250 mL flush bag   Intravenous Q15 Min PRN   . DAPTOmycin (CUBICIN) 800 mg in NS 50 mL IVPB  10 mg/kg (Adjusted) Intravenous Q24H   . docusate sodium (COLACE) capsule  100 mg Oral 2x/day   . electrolyte-A (PLASMALYTE-A) premix infusion   Intravenous Continuous   . elvitegravir-cobicistat-emtricitrabine-tenofovir (GENVOYA) 150-150-200-10 mg per tablet  1 Tablet Oral Daily   . gabapentin (NEURONTIN) capsule  300 mg Oral 3x/day   . heparin 25,000 units in NS 250 mL infusion  12 Units/kg/hr (Adjusted) Intravenous Continuous   .  HYDROmorphone (DILAUDID) 1 mg/mL injection  0.2 mg Intravenous Q3H PRN   . iopamidol (ISOVUE-300) 61% infusion  100 mL Intravenous Give in GI   . meropenem (MERREM) 1 g in NS 100 mL IVPB  1 g Intravenous Q8H   . methocarbamol (ROBAXIN) tablet  500 mg Oral 4x/day   . micafungin (MYCAMINE) 100 mg in NS 100 mL IVPB  100 mg Intravenous Q24H   . multivitamin-minerals-folic acid-lycopene-lutein (CERTAVITE SENIOR) tablet  1 Tablet Oral Q24H   . NS 250 mL flush bag   Intravenous Q15 Min PRN   . NS flush syringe  2-6 mL Intracatheter Q8HRS   . NS flush syringe  2-6 mL Intracatheter Q1 MIN PRN   . NS flush syringe  10-30 mL Intracatheter Q8HRS   . NS flush syringe  20-30 mL Intracatheter Q1 MIN PRN   . NS flush syringe  10-30 mL Intracatheter Q8HRS   . NS flush syringe  20-30 mL Intracatheter Q1 MIN PRN   . ondansetron (ZOFRAN) 2 mg/mL injection  4 mg Intravenous Q6H PRN   . oxyCODONE (ROXICODONE) immediate release tablet  5 mg Oral Q4H PRN   . oxyCODONE (ROXICODONE) immediate releaste tablet  10 mg Oral Q4H PRN   . pantoprazole (PROTONIX) delayed release tablet  40 mg Oral Q12H   . prochlorperazine (COMPAZINE) 5 mg/mL injection  10 mg Intravenous Q6H PRN   . sennosides-docusate sodium (SENOKOT-S) 8.6-'50mg'$  per tablet  1 Tablet Oral 2x/day   . SSIP insulin R human (HUMULIN R) 100 units/mL injection  0-12 Units Subcutaneous 4x/day PRN       Allene Pyo, PA-C  04/27/2021, 08:06

## 2021-04-27 NOTE — Consults (Signed)
[facility]  Ophthalmology F/U Consult Note      [name], [name], 76 y.o. male  Date of Service:  04/27/2021       Requesting physician: Nicola Police, MD     Information Obtained from: patient  Chief Complaint:  Fungemia with no vision complains    The American Academy of Ophthalmology recommends against routine screening in the setting of candida fungemia when there is no sign of an ocular infection: [email or web address]    HPI/Discussion:  [name] is a 76 y.o. male PMH HIV undectable virus load, currently admitted 2/2 extravasation at surgical site from recent pancreatectomy and splenectomy. He was found to have fungemia and Ophthalmology was subsequently consulted. He denies any vision complaints currently.    Subjective: no change overnight in vision.    Past Ocular Hx:  none  Ocular Meds:  none  Family ocular history: none    Past Medical History:   Diagnosis Date    Cancer (CMS HCC)     prostate    Deep vein thrombosis (DVT) (CMS HCC)     right leg    Esophageal reflux     H/O hearing loss     High cholesterol     History of kidney disease     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    HTN (hypertension)     Human immunodeficiency virus (HIV) disease (CMS HCC)     Hyperlipidemia     "borderline"    Hypothyroidism     MRSA (methicillin resistant staph aureus) culture positive 01/02/2021    MRSA left groin abscess 01/03/21    MRSA (methicillin resistant staph aureus) culture positive 01/02/2021    MRSA blood 01/02/21    Pulmonary embolism (CMS [place]) 03/31/2021    Thyroid disorder     Wears glasses          Past Surgical History:   Procedure Laterality Date    COLON SURGERY      per patient had 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 Darryl Nestle CERVICAL SPINE SURGERY  2001    HX COLOSTOMY  REVERSAL      HX HERNIA REPAIR      KNEE SURGERY Bilateral     WRIST SURGERY Right            Prior to Admission Meds:  Medications Prior to Admission       Prescriptions    acetaminophen (TYLENOL) 500 mg Oral Tablet    Take 2 Tablets (1,000 mg total) by mouth Every 4 hours as needed for Pain    amLODIPine (NORVASC) 5 mg Oral Tablet    Take 1 Tablet (5 mg total) by mouth Once a day    apixaban (ELIQUIS) 5 mg Oral Tablet    Take 2 Tablets (10 mg total) by mouth Twice daily for 14 doses    apixaban (ELIQUIS) 5 mg Oral Tablet    Take 1 Tablet (5 mg total) by mouth Twice daily for 180 days    atorvastatin (LIPITOR) 80 mg Oral Tablet    Take 0.5 Tablets (40 mg total) by mouth Every morning with breakfast    BIOTIN ORAL    Take 1 Tablet by mouth Once a day    Blood Sugar  Diagnostic (ACCU-CHEK GUIDE TEST STRIPS) Strip    1 Strip Four times a day - before meals and bedtime    cetirizine (ZYRTEC) 10 mg Oral Tablet    Take 1 Tablet (10 mg total) by mouth Once a day    cyanocobalamin (VITAMIN B 12) 1,000 mcg Oral Tablet    Take 1 Tablet (1,000 mcg total) by mouth Every morning    elviteg-cob-emtri-tenof ALAFEN (GENVOYA) 150-150-200-10 mg Oral Tablet    Take 1 Tablet by mouth Once a day    flash glucose scanning reader (FREESTYLE LIBRE 2 READER) Does not apply Misc    Use as directed with FreeStyle Libre 2 sensors    flash glucose sensor (FREESTYLE LIBRE 2 SENSOR) Does not apply Kit    To check blood glucose continuously. Change every 14 days    gabapentin (NEURONTIN) 300 mg Oral Capsule    Take 1 Capsule (300 mg total) by mouth Three times a day    insulin aspart U-100 (NOVOLOG) 100 unit/mL (3 mL) Subcutaneous Insulin Pen    Inject 4 Units under the skin Three times a day With meals    insulin glargine (LANTUS SOLOSTAR U-100 INSULIN) 100 unit/mL Subcutaneous Insulin Pen    Inject 15 units under the skin daily    insulin syr/ndl U100 half mark 0.3 mL 31 gauge x 5/16" Syringe    Use a new syringe for each injection.    lancets  (FREESTYLE LANCETS) 28 gauge Misc    Use to test blood sugar 4 times a day - before meals and bedtime    levothyroxine (SYNTHROID) 88 mcg Oral Tablet    Take 1 Tablet (88 mcg total) by mouth Every morning    melatonin 5 mg Oral Tablet    Take 1 Tablet (5 mg total) by mouth Every night    MULTIVITAMIN ORAL    Take 1 Tablet by mouth Once a day    pantoprazole (PROTONIX) 40 mg Oral Tablet, Delayed Release (E.C.)    Take 1 Tablet (40 mg total) by mouth Twice daily 30 minutes before a meal    Pen Needle, Disposable, (BD NANO 2ND GEN PEN NEEDLE) 32 gauge x 5/32" Needle    Use a new pen needle for each injection.    POTASSIUM ORAL    Take 1 Tablet by mouth Once a day            Inpatient Meds:  acetaminophen (TYLENOL) tablet, 500 mg, Oral, Q4H  atorvastatin (LIPITOR) tablet, 40 mg, Oral, Daily with Breakfast  benzonatate (TESSALON) capsule, 100 mg, Oral, Q8H PRN  D5W 250 mL flush bag, , Intravenous, Q15 Min PRN  DAPTOmycin (CUBICIN) 800 mg in NS 50 mL IVPB, 10 mg/kg (Adjusted), Intravenous, Q24H  docusate sodium (COLACE) capsule, 100 mg, Oral, 2x/day  electrolyte-A (PLASMALYTE-A) premix infusion, , Intravenous, Continuous  elvitegravir-cobicistat-emtricitrabine-tenofovir (GENVOYA) 150-150-200-10 mg per tablet, 1 Tablet, Oral, Daily  gabapentin (NEURONTIN) capsule, 300 mg, Oral, 3x/day  heparin 25,000 units in NS 250 mL infusion, 12 Units/kg/hr (Adjusted), Intravenous, Continuous  HYDROmorphone (DILAUDID) 1 mg/mL injection, 0.2 mg, Intravenous, Q3H PRN  meropenem (MERREM) 1 g in NS 100 mL IVPB, 1 g, Intravenous, Q8H  methocarbamol (ROBAXIN) tablet, 500 mg, Oral, 4x/day  micafungin (MYCAMINE) 100 mg in NS 100 mL IVPB, 100 mg, Intravenous, Q24H  multivitamin-minerals-folic acid-lycopene-lutein (CERTAVITE SENIOR) tablet, 1 Tablet, Oral, Q24H  NS 250 mL flush bag, , Intravenous, Q15 Min PRN  NS flush syringe, 2-6 mL, Intracatheter, Q8HRS  NS flush syringe, 2-6 mL,  Intracatheter, Q1 MIN PRN  NS flush syringe, 10-30 mL,  Intracatheter, Q8HRS  NS flush syringe, 20-30 mL, Intracatheter, Q1 MIN PRN  NS flush syringe, 10-30 mL, Intracatheter, Q8HRS  NS flush syringe, 20-30 mL, Intracatheter, Q1 MIN PRN  ondansetron (ZOFRAN) 2 mg/mL injection, 4 mg, Intravenous, Q6H PRN  oxyCODONE (ROXICODONE) immediate release tablet, 5 mg, Oral, Q4H PRN  oxyCODONE (ROXICODONE) immediate releaste tablet, 10 mg, Oral, Q4H PRN  pantoprazole (PROTONIX) delayed release tablet, 40 mg, Oral, Q12H  prochlorperazine (COMPAZINE) 5 mg/mL injection, 10 mg, Intravenous, Q6H PRN  sennosides-docusate sodium (SENOKOT-S) 8.6-50mg per tablet, 1 Tablet, Oral, 2x/day  SSIP insulin R human (HUMULIN R) 100 units/mL injection, 0-12 Units, Subcutaneous, 4x/day PRN        No Known Allergies  Social History     Tobacco Use    Smoking status: Never    Smokeless tobacco: Never   Substance Use Topics    Alcohol use: Not Currently     Family Medical History:       Problem Relation (Age of Onset)    Cancer Mother, Father, Other    Heart Attack Mother    High Cholesterol Other    Stroke Other              ROS: Other than ROS in the HPI, all other systems were negative.    Exam:  Temperature: 37 C (98.6 F)  Heart Rate: 90  BP (Non-Invasive): 131/74  Respiratory Rate: 19  SpO2: 91 %    Visual Acuity:   Near with glasses  ph  Distance    OD 20/30  20/25-2      OS 20/25  20/25        OD OS   Confr Vis Fields WNL WNL   EOM (Primary) WNL WNL   Pupils WNL WNL   Reaction, Direct WNL WNL                  Consensual WNL WNL                  RAPD No No        Adnexa            WNL WNL   Conjunctiva (palpebral) WNL WNL   Conjunctiva (bulbar) WNL WNL   Cornea WNL WNL   Anterior chamber WNL WNL   Iris WNL WNL   Lens:  NSC NSC   IOP 12 13        Fundus - Dilated? Yes    Optic Disc - C:D Ratio 0.3 0.3                      Appearance  WNL WNL   Post Seg:  Retina                     Vitreous WNL WNL                   Vessels  WNL WNL                   Macula WNL WNL                   Periphery WNL  WNL     Neuro:  Oriented to person, place, and time:  Yes  Psychiatric:  Mood and Affect Appropriate:  Yes    Labs/imaging:     A/P:   76 y.o. M currently admitted after  surgical procedure and found to have fungemia. Visual acuity is 20/30 and 20/25. IOP is WNL. EOM full. No APD. Anterior exam with developing cataracts. Posterior exam unremarkable.    Recommendations  - Normal fundus exam  - Patient will follow-up with his eye doctor at the [facility] for cataract  - No acute ophthalmic interventions at this time    Appreciate the consultation.     Daine Floras, MD 04/27/2021, 11:00      I saw and examined the patient.  I reviewed the resident's note.  I agree with the findings and plan of care as documented in the resident's note.  Any exceptions/additions are edited/noted.    Fonnie Jarvis, MD

## 2021-04-27 NOTE — Consults (Signed)
PATIENT NAME: [name] NUMBER:  [id number]  DATE OF SERVICE: 04/27/2021  DATE OF BIRTH:  Oct 08, 1945    INFECTIOUS DISEASE CONSULT FOLLOWUP NOTE    Length of hospital stay:  24 days.    REASON FOR CONSULT:  Fungemia.    SUBJECTIVE:  Patient is resting in bed. He reports he is slowly advancing his diet as he can tolerate. No abdominal pain in the last several days. No fever or chills. No rash or pruritis. Denies visual changes.     OBJECTIVE:  Filed Vitals:    04/27/21 0317 04/27/21 0319 04/27/21 0730 04/27/21 0733   BP: 119/70  131/74    Pulse:  99 90    Resp: 18  19    Temp: 37 C (98.6 F)   37 C (98.6 F)   SpO2: 92%  91%      Constitutional: Chronically ill gentleman. In no acute distress.   Neurologic: Alert and oriented to person, place, and time.   Eyes: Sclera non-icteric, conjunctiva non-injected.   HENT: Buccal mucosa moist. Healthy dentition present. No oral candidiasis.   Respiratory: Respirations are non labored on 1 L NC. Full breath sounds anteriorly. No crackles or wheeze.   Cardiovascular: Heart has a regular rate and rhythm without murmur.   Gastrointestinal: Abdomen with midline staples in place and dehiscence of inferior aspect with ostomy in place producing stool. Drains x 2 the the RUQ producing tan fluid.   Musculoskeletal: No swelling of the bilateral upper or lower extremities. Moves limbs spontaneously.   Integumentary: No diffuse rashes. Peripheral IVs in place.     ANTIBIOTICS:  1. Vancomycin, cefepime, and metronidazole 3/9 - 04/12/2021    2. Daptomycin and meropenem 04/12/2021 -   3. Micafungin 04/25/2021 -     LINES:  Patient Lines/Drains/Airways Status       Active Line / Dialysis Catheter / Dialysis Graft / Drain / Airway / Wound       Name Placement date Placement time Site Days    Peripheral IV Ultrasound guided Left;Lower Cephalic  (lateral side of arm) 04/25/21  1000  -- 2    Peripheral IV Anterior;Right;Upper Arm 04/25/21  --  -- 2    Terrial Rhodes Drain  Right;Upper Abdomen 04/03/21  1055  -- 23    Drain (Miscellaneous) #4F pigtail catheter for RLQ fluid collection Lower;Right;Anterior Abdomen 04/09/21  1433  -- 17    Drain (Miscellaneous) peds ostomy bag Lower;Medial Abdomen 04/12/21  1300  -- 14    Surgical Incision Abdomen 04/03/21  1046  -- 23    Surgical Incision Other (Comment) Left;Upper Abdomen 04/03/21  1400  -- 23                  LABS:  Results for orders placed or performed during the hospital encounter of 04/03/21 (from the past 24 hour(s))   PTT (PARTIAL THROMBOPLASTIN TIME)   Result Value Ref Range    APTT 38.1 (H) 24.2 - 37.5 seconds    Narrative    Therapeutic range for unfractionated heparin is 60-100 seconds.   PTT (PARTIAL THROMBOPLASTIN TIME) - ONCE   Result Value Ref Range    APTT 59.6 (H) 24.2 - 37.5 seconds    Narrative    Therapeutic range for unfractionated heparin is 60-100 seconds.   CBC/DIFF    Narrative    The following orders were created for panel order CBC/DIFF.  Procedure  Abnormality         Status                     ---------                               -----------         ------                     CBC WITH IRCV[[id number]]                Abnormal            Final result               MANUAL DIFF AND MORPHOLO.Marland KitchenMarland Kitchen[[id number]]  Abnormal            Final result                 Please view results for these tests on the individual orders.   BASIC METABOLIC PANEL   Result Value Ref Range    SODIUM 132 (L) 136 - 145 mmol/L    POTASSIUM 4.0 3.5 - 5.1 mmol/L    CHLORIDE 99 96 - 111 mmol/L    CO2 TOTAL 27 23 - 31 mmol/L    ANION GAP 6 4 - 13 mmol/L    CALCIUM 8.2 (L) 8.8 - 10.2 mg/dL    GLUCOSE 112 65 - 125 mg/dL    BUN 12 8 - 25 mg/dL    CREATININE 0.69 (L) 0.75 - 1.35 mg/dL    BUN/CREA RATIO 17 6 - 22    ESTIMATED GFR >90 >=60 mL/min/BSA   PHOSPHORUS   Result Value Ref Range    PHOSPHORUS 2.2 (L) 2.3 - 4.0 mg/dL   MAGNESIUM   Result Value Ref Range    MAGNESIUM 2.1 1.8 - 2.6 mg/dL   CBC WITH DIFF   Result  Value Ref Range    WBC 9.3 3.7 - 11.0 x10^3/uL    RBC 2.68 (L) 4.50 - 6.10 x10^6/uL    HGB 7.7 (L) 13.4 - 17.5 g/dL    HCT 24.7 (L) 38.9 - 52.0 %    MCV 92.2 78.0 - 100.0 fL    MCH 28.7 26.0 - 32.0 pg    MCHC 31.2 31.0 - 35.5 g/dL    RDW-CV 17.6 (H) 11.5 - 15.5 %    PLATELETS 370 150 - 400 x10^3/uL    MPV 11.7 8.7 - 12.5 fL   PTT (PARTIAL THROMBOPLASTIN TIME) - ONCE   Result Value Ref Range    APTT 75.4 (H) 24.2 - 37.5 seconds    Narrative    Therapeutic range for unfractionated heparin is 60-100 seconds.   MANUAL DIFF AND MORPHOLOGY-SYSMEX   Result Value Ref Range    NEUTROPHIL % 72 %    LYMPHOCYTE %  17 %    MONOCYTE % 7 %    EOSINOPHIL % 4 %    BASOPHIL % 0 %    NEUTROPHIL # 6.70 1.50 - 7.70 x10^3/uL    LYMPHOCYTE # 1.58 1.00 - 4.80 x10^3/uL    MONOCYTE # 0.65 0.20 - 1.10 x10^3/uL    EOSINOPHIL # 0.37 <=0.50 x10^3/uL    BASOPHIL # <0.10 <=0.20 x10^3/uL    NRBC FROM MANUAL DIFF 4 per 100 WBC    HYPOCHROMASIA 2+/Moderate (A) None    POLYCHROMASIA 2+/Moderate (A) None   POC  BLOOD GLUCOSE (RESULTS)   Result Value Ref Range    GLUCOSE, POC 157 (H) 70 - 105 mg/dl   POC BLOOD GLUCOSE (RESULTS)   Result Value Ref Range    GLUCOSE, POC 144 (H) 70 - 105 mg/dl   POC BLOOD GLUCOSE (RESULTS)   Result Value Ref Range    GLUCOSE, POC 122 (H) 70 - 105 mg/dl   POC BLOOD GLUCOSE (RESULTS)   Result Value Ref Range    GLUCOSE, POC 115 (H) 70 - 105 mg/dl     MICROBIOLOGY DATA:  1. Blood cultures x2 sets obtained 04/24/2021, are positive in 1 out of 2 sets, positive set obtained from the PICC line for Candida albicans.  2. Blood cultures x 2 04/26/2021 are negative thus far.     IMAGING:  Results for orders placed or performed during the hospital encounter of 04/03/21 (from the past 72 hour(s))   CT ABDOMEN PELVIS W IV CONTRAST     Status: None    Narrative    [name]  Male, 76 years old.    CT ABDOMEN PELVIS W IV CONTRAST performed on 04/25/2021 9:48 AM.    REASON FOR EXAM:  Increased leukocytosis    TECHNIQUE: Volumetric  images were obtained from the lung bases through the pelvis with axial, coronal, and sagittal reformatting.    RADIATION DOSE: 401.99 mGy.cm    COMPARISON: 04/20/2021    FINDINGS:   LUNG BASES: Trace bilateral pleural fluid. Right basilar consolidation with air bronchograms, slightly improved from the prior.    LIVER: Liver is normal in size. Perihepatic fluid collection has near completely resolved with pigtail drain in stable positioning. Hepatic and portal veins are patent.    BILIARY SYSTEM/GALLBLADDER: Gallbladder is physiologically distended. Pericholecystic portion of the fluid collection has decreased in size. No biliary duct dilation.    PANCREAS: Postoperative changes from distal pancreatectomy. Rim-enhancing fluid collection within the surgical bed is stable in size, measuring approximately 5.6 x 2.6 cm, although likely not adequately measured due to metallic streak artifact.    KIDNEY/URETERS/BLADDER: Kidneys are symmetric in enhancement. No hydronephrosis. Multiple bilateral simple renal cysts. Ureters are of normal course and caliber. Bladder is distended without perivesicular stranding.    ADRENALS: Normal in size and unremarkable in appearance.    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 Left upper quadrant fluid collection with surgical drain in place is similar in appearance to the prior study. Fluid collection measures 11.4 x 8.1 cm.    BOWEL: The bowel is nondilated. No evidence for obstruction. Intraluminal contrast is seen throughout the majority of the colon. Scattered diverticulosis. Stomach is mostly decompressed. There is some stranding along the gastroduodenal junction, likely postoperative. Bowel anastomosis just superior to the bladder appears widely patent.    VASCULATURE/LYMPH NODES: Aorta is normal in caliber. Scattered atherosclerotic calcifications are present. IVC filter in place. Celiac axis at the origin is patent however due to artifact the remaining celiac axis is not  well assessed.    REPRODUCTIVE ORGANS: Postsurgical changes within the prominent prostate.    PERITONEAL CAVITY: Near complete resolution of postoperative pneumoperitoneum. Multiple fluid collections involving the liver, splenectomy bed, and distal pancreatectomy site are discussed above. No new fluid collection is identified.    BONES/SOFT TISSUES: Midline abdominal incision again seen with underlying fluid/inflammatory change, appears more prominent than on the prior study. Multilevel degenerative changes of the spine. Vertebral body heights are maintained. Stable inflammation and fluid within the right groin, likely related to recent central  venous access.        Impression    1.Increased fluid and inflammation along the midline abdominal wall incision when compared to 04/20/2021, may represent developing soft tissue infection. No contrast within this collection to suggest colocutaneous fistula, which was identified on the prior study.  2.Stable fluid collections within the splenectomy bed and pancreatic resection cavity.  3.Interval improvement of perihepatic fluid collection with pigtail drain remaining in place.  4.Persistent but improving right lower lobe consolidation.  5.Celiac origin is patent however the remaining celiac artery is not well assessed due to metallic well artifact.       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  He has been on treatment with daptomycin and meropenem for infected hematoma at the postsurgical site with Pseudomonas aeruginosa bacteremia (anticipated end date 05/07/2021). Infectious Disease is now being consulted given Candida albicans CLABSI s/p explant of PICC line on 04/25/2021. Repeat blood cultures on 04/26/2021 are pending. No evidence of fungal endophthalmitis. Repeat CT scan shows improvement in RUQ collection with stable collections within the splenectomy resection cavity.     1. Continue to follow pending blood cultures and susceptibility testing.  2. Obtain a TTE.   3. Suggest maximizing intra-abdominal collection drainage as feasible per surgical service.   4. Discontinue micafungin. Transition to fluconazole 400 mg IV/PO Q 24 hours. If he has reliable GI absorption suggest PO formulation. Recommend monitoring QTc every 72 hours while hospitalized to ensure no further prolongation whiel on Genvoya and fluconazole.    5. Continue daptomycin 10 mg/kg IV q.24 hours.  6. Continue meropenem 1 gram IV q.8 hours.  7. Recommend a 2 week course of Micafungin, daptomycin, and meropenem from date of negative blood culture, 04/26/2021. Today is day 2 out of 14. Anticipated end date of 05/09/2021.   8. Consult OPAT if the patient will be discharged on IV antibiotics.   9. Continue Genvoya.   10. Refer the patient to ID as needed.   11. Follow CBC + differential, BMP, LFT panel, and CRP for safety and monitoring while on antibiotics.     We will sign off today. Please call with any further questions.    I independently of the faculty provider spent a total of 25 minutes in direct/indirect care of this patient including initial evaluation, review of laboratory, radiology, diagnostic studies, review of medical record, order entry and coordination of care.    Monica Martinez, PA-C  04/27/2021, 10:54    I saw  and examined the patient. I reviewed the APP's note. I agree with the findings and plan of care as documented in the APP's note. Any exceptions/additions are edited/noted.    [name] is accompanied by his wife at bedside. We discussed transitioning to PO fluconazole with close monitoring of QTC. And would continue IV abx as previously planned.  On the day of the encounter, a total of 10 minutes was spent on this patient encounter including  review of historical information, examination, documentation and post-visit activities. The time documented excludes procedural time.    Ortencia Kick, MD  Assistant Professor of Medicine  Infectious Diseases, Transplant

## 2021-04-28 ENCOUNTER — Inpatient Hospital Stay (HOSPITAL_COMMUNITY): Payer: 59

## 2021-04-28 DIAGNOSIS — I330 Acute and subacute infective endocarditis: Secondary | ICD-10-CM

## 2021-04-28 DIAGNOSIS — I361 Nonrheumatic tricuspid (valve) insufficiency: Secondary | ICD-10-CM

## 2021-04-28 LAB — TRANSTHORACIC ECHOCARDIOGRAM - ADULT
AV LVOT peak gradient: 6 mmHg
AV mean gradient: 4 mmHg
Ao VTI: 22.2 cm
Ao root annulus: 3.3 cm
Ao root annulus: 3.3 cm
Aortic Valve Area by Continuity of VTI: 3.8 cm2
Ascending aorta: 3.3 cm
E wave decelartion time: 238 ms
E/A ratio: 0.8
E/E' ratio: 10.4
LA Volume Index: 12.9 ml/m2
LA volume: 31.2 cm3
LVOT diameter: 2.1 cm
LVOT stroke volume: 84 cm3
Left Ventricular Outflow Tract Peak Velocity: 126 cm/s
MV Comp VTI: 20.8 cm
MV Peak A Vel: 99.4 cm/s
MV Peak E Vel: 84 cm/s
MV mean gradient: 2 mmHg
MV stenosis pressure 1/2 time: 70 ms
Mitral Valve Max Velocity: 97 cm/s
PV peak gradient: 2 mmHg
Pulmonic Valve Acceleration Time: 88 ms
Pulmonic Valve Max Velocity: 64.3 cm/s
TDI: 8.05 cm/s
TDI: 8.05 cm/s

## 2021-04-28 LAB — BASIC METABOLIC PANEL
ANION GAP: 7 mmol/L (ref 4–13)
BUN/CREA RATIO: 16 (ref 6–22)
BUN: 11 mg/dL (ref 8–25)
CALCIUM: 8.3 mg/dL — ABNORMAL LOW (ref 8.8–10.2)
CHLORIDE: 103 mmol/L (ref 96–111)
CO2 TOTAL: 25 mmol/L (ref 23–31)
CREATININE: 0.68 mg/dL — ABNORMAL LOW (ref 0.75–1.35)
ESTIMATED GFR: >90 mL/min/BSA (ref >=60)
GLUCOSE: 111 mg/dL (ref 65–125)
POTASSIUM: 4.1 mmol/L (ref 3.5–5.1)
SODIUM: 135 mmol/L — ABNORMAL LOW (ref 136–145)

## 2021-04-28 LAB — CBC
HCT: 26.7 % — ABNORMAL LOW (ref 38.9–52.0)
HGB: 8.2 g/dL — ABNORMAL LOW (ref 13.4–17.5)
MCH: 28 pg (ref 26.0–32.0)
MCHC: 30.7 g/dL — ABNORMAL LOW (ref 31.0–35.5)
MCV: 91.1 fL (ref 78.0–100.0)
MPV: 11.7 fL (ref 8.7–12.5)
PLATELETS: 428 10*3/uL — ABNORMAL HIGH (ref 150–400)
RBC: 2.93 10*6/uL — ABNORMAL LOW (ref 4.50–6.10)
RDW-CV: 17.7 % — ABNORMAL HIGH (ref 11.5–15.5)
WBC: 10 10*3/uL (ref 3.7–11.0)

## 2021-04-28 LAB — POC BLOOD GLUCOSE (RESULTS)
GLUCOSE, POC: 116 mg/dl — ABNORMAL HIGH (ref 70–105)
GLUCOSE, POC: 145 mg/dL — ABNORMAL HIGH (ref 70–105)
GLUCOSE, POC: 178 mg/dL — ABNORMAL HIGH (ref 70–105)
GLUCOSE, POC: 136 mg/dL — ABNORMAL HIGH (ref 70–105)

## 2021-04-28 LAB — PTT (PARTIAL THROMBOPLASTIN TIME)
APTT: 58.3 seconds — ABNORMAL HIGH (ref 24.2–37.5)
APTT: 47.4 seconds — ABNORMAL HIGH (ref 24.2–37.5)
APTT: 49.2 seconds — ABNORMAL HIGH (ref 24.2–37.5)

## 2021-04-28 LAB — MAGNESIUM: MAGNESIUM: 2.1 mg/dL (ref 1.8–2.6)

## 2021-04-28 LAB — PHOSPHORUS: PHOSPHORUS: 1.6 mg/dL — ABNORMAL LOW (ref 2.3–4.0)

## 2021-04-28 MED ORDER — SODIUM PHOSPHATE 3 MMOL/ML INTRAVENOUS SOLUTION
30.0000 mmol | Freq: Once | INTRAVENOUS | Status: AC
Start: 2021-04-28 — End: 2021-04-28
  Administered 2021-04-28: 0 mmol via INTRAVENOUS
  Administered 2021-04-28: 30 mmol via INTRAVENOUS
  Filled 2021-04-28: qty 10

## 2021-04-28 MED ORDER — SODIUM CHLORIDE 0.9 % INJECTION SOLUTION
2.0000 mL | INTRAVENOUS | Status: AC
Start: 2021-04-28 — End: 2021-04-28
  Administered 2021-04-28: 2 mL via INTRAVENOUS

## 2021-04-28 NOTE — Progress Notes (Signed)
[facility]  Surgical Oncology  Progress Note      [name], [name], 76 y.o. male  Date of Birth:  1945/02/20  Date of Admission:  04/03/2021  Date of service: 04/28/2021    Assessment:  This is a 76 y.o. 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 He underwent massive transfusion protocol and ex lap on 04/03/21 at an outside facility with ligation of a bleeding vessel in the liver bed. Patient was subsequently transferred to [facility] postoperaively, intubated. Patient was subsequently extubated later that day on 3/4. 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      Plan/Recommendations:  - I/D consult for fungemia   - Blood cultures 04/26/21 NGTD   - ECHO ordered    - Continue micafungin, daptomycin, and meropenem  - Diet: Continue diabetic diet   Calorie count started 04/27/21  - Restarted fluid  - Peripancreatic fluid collections   - GI scope on 3/23 unable to stent PD duct, patient with pancreatic divisim              - IR consulted; RUQ drain placement and empiric embolization 3/10              - Blood cultures 3/9: + pseudomonas    - Cultures form R JP: Pseudomonas and enterococcus              - Repeat Blood cultures 3/12 negative   - ID consulted; continue Daptomycin and meropenum (stop date 4/7)  - PE   - s/p IVC filter placement 3/17   - Aggressive pulm toilet, supp off O2   - Continue low dose heparin  - IVF: Plasmalyte 50 ml/hr  - Midline fistula with ostomy bag added to monitor output  - OOB TID  - PT/OT ordered  - Dispo transfer to floor    Subjective:   No issues overnight. Denies any abdominal pain.     Objective  Filed Vitals:    04/27/21 1919 04/27/21 2220 04/27/21 2300 04/28/21 0308   BP: 121/70  136/74 127/74   Pulse: 88  (!) 104 92   Resp: '19 18 17 20   '$ Temp: 37.1 C (98.8 F)  36.8 C (98.2 F) 36.8  C (98.3 F)   SpO2: 91%  97% 94%     Physical Exam:   GEN:  AOx4, resting in bed, no acute distress  PULM: Normal respiratory effort.   CV:  Tachycardic, regular rhythm   ABD:   Abdomen soft, minimal ttp RUQ, nondistended. JP in place in RUQ,  Lower midline incision with feculent drainage present with ostomy bag.  Pigtail catheter present with SS output.  MS: Atraumatic. Moves all extremities.  NEURO:   Alert and oriented to person, place and time.  Cranial nerves grossly intact.    Integumentary:  Pink, warm, and dry  PSYCHOSOCIAL: Pleasant.  Normal affect.     Labs  Results for orders placed or performed during the hospital encounter of 04/03/21 (from the past 24 hour(s))   PTT (PARTIAL THROMBOPLASTIN TIME) - ONCE   Result Value Ref Range    APTT 83.1 (H) 24.2 - 37.5 seconds    Narrative    Therapeutic range for unfractionated heparin is 60-100 seconds.   PTT (PARTIAL THROMBOPLASTIN TIME)   Result Value Ref Range  APTT 76.9 (H) 24.2 - 37.5 seconds    Narrative    Therapeutic range for unfractionated heparin is 60-100 seconds.   PHOSPHORUS   Result Value Ref Range    PHOSPHORUS 1.6 (L) 2.3 - 4.0 mg/dL   CBC   Result Value Ref Range    WBC 10.0 3.7 - 11.0 x10^3/uL    RBC 2.93 (L) 4.50 - 6.10 x10^6/uL    HGB 8.2 (L) 13.4 - 17.5 g/dL    HCT 26.7 (L) 38.9 - 52.0 %    MCV 91.1 78.0 - 100.0 fL    MCH 28.0 26.0 - 32.0 pg    MCHC 30.7 (L) 31.0 - 35.5 g/dL    RDW-CV 17.7 (H) 11.5 - 15.5 %    PLATELETS 428 (H) 150 - 400 x10^3/uL    MPV 11.7 8.7 - 12.5 fL   MAGNESIUM   Result Value Ref Range    MAGNESIUM 2.1 1.8 - 2.6 mg/dL   BASIC METABOLIC PANEL   Result Value Ref Range    SODIUM 135 (L) 136 - 145 mmol/L    POTASSIUM 4.1 3.5 - 5.1 mmol/L    CHLORIDE 103 96 - 111 mmol/L    CO2 TOTAL 25 23 - 31 mmol/L    ANION GAP 7 4 - 13 mmol/L    CALCIUM 8.3 (L) 8.8 - 10.2 mg/dL    GLUCOSE 111 65 - 125 mg/dL    BUN 11 8 - 25 mg/dL    CREATININE 0.68 (L) 0.75 - 1.35 mg/dL    BUN/CREA RATIO 16 6 - 22    ESTIMATED GFR >90 >=60 mL/min/BSA    PTT (PARTIAL THROMBOPLASTIN TIME) - ONCE   Result Value Ref Range    APTT 58.3 (H) 24.2 - 37.5 seconds    Narrative    Therapeutic range for unfractionated heparin is 60-100 seconds.   POC BLOOD GLUCOSE (RESULTS)   Result Value Ref Range    GLUCOSE, POC 167 (H) 70 - 105 mg/dl   POC BLOOD GLUCOSE (RESULTS)   Result Value Ref Range    GLUCOSE, POC 180 (H) 70 - 105 mg/dl   POC BLOOD GLUCOSE (RESULTS)   Result Value Ref Range    GLUCOSE, POC 183 (H) 70 - 105 mg/dl   POC BLOOD GLUCOSE (RESULTS)   Result Value Ref Range    GLUCOSE, POC 116 (H) 70 - 105 mg/dl       I/O:  Date 04/27/21 0700 - 04/28/21 0659 04/28/21 0700 - 04/29/21 0659   Shift 0700-1459 1500-2259 2300-0659 24 Hour Total 0700-1459 1500-2259 2300-0659 24 Hour Total   INTAKE   P.O. 300 357 120 777         Oral 300 120 120 540         Supplement Volume  237  237       I.V.(mL/kg/hr) 440.8(0.69) 354.02(0.56) 517.66(0.81) 1312.48(0.69)         IVPB Volume    120 120         Med (IV) Flush Volume   10 10         Heparin Volume 107.47 154.02 87.66 349.15         Volume Infused (electrolyte-A (PLASMALYTE-A) premix infusion) 333.33 200 300 833.33       Shift Total(mL/kg) 740.8(9.32) 761.95(0.93) 267.12(4.58) 0998.33(82.50)       OUTPUT   Urine(mL/kg/hr) 725(1.14) 325(0.51) 600(0.94) 1650(0.86)         Urine (Voided) [phone]  Urine Occurrence  0 x  0 x       Emesis             Emesis Occurrence  0 x  0 x       Drains 150 460 376 986         JP Drain Output ([place] [employer] Drain Right;Upper Abdomen)   6 6         Drain Output (Drain (Miscellaneous) #72F pigtail catheter for RLQ fluid [employer], [employer];Anterior Abdomen) 150 410 320 880         Drain Output (Drain (Miscellaneous) peds ostomy bag Lower;Medial Abdomen)  50 50 100       Other             Other  0 x  0 x       Stool             Stool Occurrence 3 x 2 x 1 x 6 x       Shift Total(mL/kg) 673(41.93) 785(9.87) 790(24.09) 7353(29.92)       Weight (kg) 79.5 79.5 79.5 79.5 79.5 79.5 79.5 79.5        Radiology  Results for orders placed or performed during the hospital encounter of 04/03/21 (from the past 72 hour(s))   CT ABDOMEN PELVIS W IV CONTRAST     Status: None    Narrative    HINTON LUELLEN Berrios  Male, 76 years old.    CT ABDOMEN PELVIS W IV CONTRAST performed on 04/25/2021 9:48 AM.    REASON FOR EXAM:  Increased leukocytosis    TECHNIQUE: Volumetric images were obtained from the lung bases through the pelvis with axial, coronal, and sagittal reformatting.    RADIATION DOSE: 401.99 mGy.cm    COMPARISON: 04/20/2021    FINDINGS:   LUNG BASES: Trace bilateral pleural fluid. Right basilar consolidation with air bronchograms, slightly improved from the prior.    LIVER: Liver is normal in size. Perihepatic fluid collection has near completely resolved with pigtail drain in stable positioning. Hepatic and portal veins are patent.    BILIARY SYSTEM/GALLBLADDER: Gallbladder is physiologically distended. Pericholecystic portion of the fluid collection has decreased in size. No biliary duct dilation.    PANCREAS: Postoperative changes from distal pancreatectomy. Rim-enhancing fluid collection within the surgical bed is stable in size, measuring approximately 5.6 x 2.6 cm, although likely not adequately measured due to metallic streak artifact.    KIDNEY/URETERS/BLADDER: Kidneys are symmetric in enhancement. No hydronephrosis. Multiple bilateral simple renal cysts. Ureters are of normal course and caliber. Bladder is distended without perivesicular stranding.    ADRENALS: Normal in size and unremarkable in appearance.    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 Left upper quadrant fluid collection with surgical drain in place is similar in appearance to the prior study. Fluid collection measures 11.4 x 8.1 cm.    BOWEL: The bowel is nondilated. No evidence for obstruction. Intraluminal contrast is seen throughout the majority of the colon. Scattered diverticulosis. Stomach is mostly decompressed. There is  some stranding along the gastroduodenal junction, likely postoperative. Bowel anastomosis just superior to the bladder appears widely patent.    VASCULATURE/LYMPH NODES: Aorta is normal in caliber. Scattered atherosclerotic calcifications are present. IVC filter in place. Celiac axis at the origin is patent however due to artifact the remaining celiac axis is not well assessed.    REPRODUCTIVE ORGANS: Postsurgical changes within the prominent prostate.    PERITONEAL CAVITY: Near complete resolution of postoperative pneumoperitoneum. Multiple fluid  collections involving the liver, splenectomy bed, and distal pancreatectomy site are discussed above. No new fluid collection is identified.    BONES/SOFT TISSUES: Midline abdominal incision again seen with underlying fluid/inflammatory change, appears more prominent than on the prior study. Multilevel degenerative changes of the spine. Vertebral body heights are maintained. Stable inflammation and fluid within the right groin, likely related to recent central venous access.        Impression    1.Increased fluid and inflammation along the midline abdominal wall incision when compared to 04/20/2021, may represent developing soft tissue infection. No contrast within this collection to suggest colocutaneous fistula, which was identified on the prior study.  2.Stable fluid collections within the splenectomy bed and pancreatic resection cavity.  3.Interval improvement of perihepatic fluid collection with pigtail drain remaining in place.  4.Persistent but improving right lower lobe consolidation.  5.Celiac origin is patent however the remaining celiac artery is not well assessed due to metallic well artifact.         Current Medications:  Current Facility-Administered Medications   Medication Dose Route Frequency    acetaminophen (TYLENOL) tablet  500 mg Oral Q4H    atorvastatin (LIPITOR) tablet  40 mg Oral Daily with Breakfast    benzonatate (TESSALON) capsule  100 mg Oral Q8H  PRN    D5W 250 mL flush bag   Intravenous Q15 Min PRN    DAPTOmycin (CUBICIN) 800 mg in NS 50 mL IVPB  10 mg/kg (Adjusted) Intravenous Q24H    [Held by provider] docusate sodium (COLACE) capsule  100 mg Oral 2x/day    electrolyte-A (PLASMALYTE-A) premix infusion   Intravenous Continuous    elvitegravir-cobicistat-emtricitrabine-tenofovir (GENVOYA) 150-150-200-10 mg per tablet  1 Tablet Oral Daily    fluconazole (DIFLUCAN) 400 mg in NS 200 mL premix IVPB  400 mg Intravenous Q24H    gabapentin (NEURONTIN) capsule  300 mg Oral 3x/day    heparin 25,000 units in NS 250 mL infusion  12 Units/kg/hr (Adjusted) Intravenous Continuous    HYDROmorphone (DILAUDID) 1 mg/mL injection  0.2 mg Intravenous Q3H PRN    meropenem (MERREM) 1 g in NS 100 mL IVPB  1 g Intravenous Q8H    methocarbamol (ROBAXIN) tablet  500 mg Oral 4x/day    multivitamin-minerals-folic acid-lycopene-lutein (CERTAVITE SENIOR) tablet  1 Tablet Oral Q24H    NS 250 mL flush bag   Intravenous Q15 Min PRN    NS flush syringe  2-6 mL Intracatheter Q8HRS    NS flush syringe  2-6 mL Intracatheter Q1 MIN PRN    NS flush syringe  10-30 mL Intracatheter Q8HRS    NS flush syringe  20-30 mL Intracatheter Q1 MIN PRN    NS flush syringe  10-30 mL Intracatheter Q8HRS    NS flush syringe  20-30 mL Intracatheter Q1 MIN PRN    ondansetron (ZOFRAN) 2 mg/mL injection  4 mg Intravenous Q6H PRN    oxyCODONE (ROXICODONE) immediate release tablet  5 mg Oral Q4H PRN    oxyCODONE (ROXICODONE) immediate releaste tablet  10 mg Oral Q4H PRN    pantoprazole (PROTONIX) delayed release tablet  40 mg Oral Q12H    prochlorperazine (COMPAZINE) 5 mg/mL injection  10 mg Intravenous Q6H PRN    [Held by provider] sennosides-docusate sodium (SENOKOT-S) 8.6-'50mg'$  per tablet  1 Tablet Oral 2x/day    sodium phosphate 30 mmol in D5W 100 mL IVPB  30 mmol Intravenous Once    SSIP insulin R human (HUMULIN R) 100 units/mL injection  0-12 Units  Subcutaneous 4x/day PRN       Allene Pyo, PA-C  04/28/2021,  07:44    I personally saw and examined the patient. See physician's assistant note for additional details.     Lynwood Dawley, MD, FACS

## 2021-04-28 NOTE — Care Plan (Signed)
Plan of care reviewed with patient throughout shift. Patient verbalizes understanding. A&OX4. Receiving scheduled Tylenol for pain control. Denies need for PRNs. O2 saturations maintained 91-97% on 1 L NC. Endorses DOE. Bronchial hygiene promoted. Patient encouraged to turn/shift weight in bed frequently to prevent skin breakdown. Preventative border in place. IV maintenance fluid running as ordered. PO intake encouraged. Calorie count in place at this time. Abdominal incision intact. Wound pouch and drains functioning properly with no leaks. Voiding adequate amounts. Fall and isolation precautions maintained. Continuous telemetry and pulse oximetry in place. Vital signs stable.   Problem: Adult Inpatient Plan of Care  Goal: Plan of Care Review  Outcome: Ongoing (see interventions/notes)  Flowsheets (Taken 04/28/2021 0539)  Plan of Care Reviewed With: patient  Goal: Patient-Specific Goal (Individualized)  Outcome: Ongoing (see interventions/notes)  Flowsheets (Taken 04/28/2021 0539)  Individualized Care Needs: encourage PO intake  Anxieties, Fears or Concerns: doesn't like being covered in wires  Patient/Family-Specific Goals (Include Timeframe): wants to go home and see his dog as soon as possible  Goal: Absence of Hospital-Acquired Illness or Injury  Outcome: Ongoing (see interventions/notes)  Intervention: Identify and Manage Fall Risk  Flowsheets  Taken 04/28/2021 0541  Safety Promotion/Fall Prevention:   activity supervised   fall prevention program maintained   motion sensor pad activated   nonskid shoes/slippers when out of bed   safety round/check completed  Taken 04/28/2021 0400  Safety Promotion/Fall Prevention:   activity supervised   fall prevention program maintained   nonskid shoes/slippers when out of bed   safety round/check completed   motion sensor pad activated  Taken 04/28/2021 0308  Safety Promotion/Fall Prevention:   activity supervised   fall prevention program maintained   motion sensor pad  activated   safety round/check completed   nonskid shoes/slippers when out of bed  Taken 04/28/2021 0230  Safety Promotion/Fall Prevention:   activity supervised   fall prevention program maintained   motion sensor pad activated   nonskid shoes/slippers when out of bed   safety round/check completed  Taken 04/28/2021 0040  Safety Promotion/Fall Prevention:   activity supervised   fall prevention program maintained   nonskid shoes/slippers when out of bed   motion sensor pad activated   safety round/check completed  Taken 04/27/2021 2300  Safety Promotion/Fall Prevention:   activity supervised   fall prevention program maintained   motion sensor pad activated   nonskid shoes/slippers when out of bed   safety round/check completed  Taken 04/27/2021 2220  Safety Promotion/Fall Prevention:   activity supervised   fall prevention program maintained   motion sensor pad activated   nonskid shoes/slippers when out of bed   safety round/check completed  Taken 04/27/2021 2123  Safety Promotion/Fall Prevention:   activity supervised   fall prevention program maintained   motion sensor pad activated   nonskid shoes/slippers when out of bed   safety round/check completed  Taken 04/27/2021 2030  Safety Promotion/Fall Prevention:   activity supervised   fall prevention program maintained   motion sensor pad activated   nonskid shoes/slippers when out of bed   safety round/check completed  Intervention: Prevent Skin Injury  Flowsheets  Taken 04/28/2021 0541  Body Position: positioned/repositioned independently  Skin Protection:   adhesive use limited   incontinence pads utilized   tubing/devices free from skin contact   transparent dressing maintained   preventative decubiti skin protection foam dressing applied/intact  Taken 04/28/2021 0308  Body Position: positioned/repositioned independently  Taken 04/27/2021 2300  Body Position:  positioned/repositioned independently  Taken 04/27/2021 2030  Body Position: weight shift assistance  provided  Skin Protection:   adhesive use limited   incontinence pads utilized   tubing/devices free from skin contact   preventative decubiti skin protection foam dressing applied/intact  Intervention: Prevent and Manage VTE (Venous Thromboembolism) Risk  Flowsheets  Taken 04/28/2021 0541  VTE Prevention/Management:   ambulation promoted   dorsiflexion/plantar flexion performed  Taken 04/27/2021 2030  VTE Prevention/Management:   ambulation promoted   dorsiflexion/plantar flexion performed  Intervention: Prevent Infection  Flowsheets  Taken 04/28/2021 0541  Infection Prevention:   barrier precautions utilized   environmental surveillance performed   equipment surfaces disinfected   glycemic control managed   personal protective equipment utilized   promote handwashing   rest/sleep promoted   single patient room provided  Taken 04/28/2021 0400  Infection Prevention:   rest/sleep promoted   barrier precautions utilized   environmental surveillance performed   equipment surfaces disinfected   promote handwashing   personal protective equipment utilized   single patient room provided  Taken 04/28/2021 0308  Infection Prevention:   barrier precautions utilized   environmental surveillance performed   equipment surfaces disinfected   glycemic control managed   personal protective equipment utilized   promote handwashing   rest/sleep promoted   single patient room provided  Taken 04/28/2021 0230  Infection Prevention:   barrier precautions utilized   environmental surveillance performed   equipment surfaces disinfected   glycemic control managed   personal protective equipment utilized   promote handwashing   rest/sleep promoted   single patient room provided  Taken 04/28/2021 0040  Infection Prevention:   barrier precautions utilized   environmental surveillance performed   equipment surfaces disinfected   glycemic control managed   promote handwashing   personal protective equipment utilized   rest/sleep promoted   single  patient room provided  Taken 04/27/2021 2300  Infection Prevention: rest/sleep promoted  Taken 04/27/2021 2220  Infection Prevention:   environmental surveillance performed   barrier precautions utilized   equipment surfaces disinfected   personal protective equipment utilized   promote handwashing   glycemic control managed   rest/sleep promoted   single patient room provided  Taken 04/27/2021 2123  Infection Prevention:   barrier precautions utilized   environmental surveillance performed   equipment surfaces disinfected   glycemic control managed   personal protective equipment utilized   promote handwashing   rest/sleep promoted   single patient room provided  Taken 04/27/2021 2030  Infection Prevention:   rest/sleep promoted   barrier precautions utilized   environmental surveillance performed   equipment surfaces disinfected   glycemic control managed   personal protective equipment utilized   promote handwashing   single patient room provided  Goal: Optimal Comfort and Wellbeing  Outcome: Ongoing (see interventions/notes)  Intervention: Monitor Pain and Promote Comfort  Flowsheets (Taken 04/28/2021 0541)  Pain Management Interventions:   care clustered   pain management plan reviewed with patient/caregiver   medication offered but refused   relaxation techniques promoted   quiet environment facilitated   pillow support provided  Intervention: Provide Person-Centered Care  Flowsheets  Taken 04/28/2021 [place] Relationship/Rapport:   care explained   choices provided   emotional support provided   empathic listening provided   questions answered   questions encouraged   reassurance provided   thoughts/feelings acknowledged  Taken 04/27/2021 2030  Trust Relationship/Rapport:   care explained   choices provided   emotional support  provided   empathic listening provided   questions encouraged   reassurance provided   thoughts/feelings acknowledged   questions answered  Goal: Rounds/Family Conference  Outcome: Ongoing  (see interventions/notes)     Problem: Fall Injury Risk  Goal: Absence of Fall and Fall-Related Injury  Outcome: Ongoing (see interventions/notes)  Intervention: Identify and Manage Contributors  Flowsheets  Taken 04/28/2021 0541  Self-Care Promotion:   independence encouraged   BADL personal objects within reach  Medication Review/Management: medications reviewed  Taken 04/27/2021 2030  Self-Care Promotion:   BADL personal objects within reach   independence encouraged  Intervention: Promote [place] 04/28/2021 0541  Safety Promotion/Fall Prevention:   activity supervised   fall prevention program maintained   motion sensor pad activated   nonskid shoes/slippers when out of bed   safety round/check completed  Taken 04/28/2021 0400  Safety Promotion/Fall Prevention:   activity supervised   fall prevention program maintained   nonskid shoes/slippers when out of bed   safety round/check completed   motion sensor pad activated  Taken 04/28/2021 0308  Safety Promotion/Fall Prevention:   activity supervised   fall prevention program maintained   motion sensor pad activated   safety round/check completed   nonskid shoes/slippers when out of bed  Taken 04/28/2021 0230  Safety Promotion/Fall Prevention:   activity supervised   fall prevention program maintained   motion sensor pad activated   nonskid shoes/slippers when out of bed   safety round/check completed  Taken 04/28/2021 0040  Safety Promotion/Fall Prevention:   activity supervised   fall prevention program maintained   nonskid shoes/slippers when out of bed   motion sensor pad activated   safety round/check completed  Taken 04/27/2021 2300  Safety Promotion/Fall Prevention:   activity supervised   fall prevention program maintained   motion sensor pad activated   nonskid shoes/slippers when out of bed   safety round/check completed  Taken 04/27/2021 2220  Safety Promotion/Fall Prevention:   activity supervised   fall prevention program  maintained   motion sensor pad activated   nonskid shoes/slippers when out of bed   safety round/check completed  Taken 04/27/2021 2123  Safety Promotion/Fall Prevention:   activity supervised   fall prevention program maintained   motion sensor pad activated   nonskid shoes/slippers when out of bed   safety round/check completed  Taken 04/27/2021 2030  Safety Promotion/Fall Prevention:   activity supervised   fall prevention program maintained   motion sensor pad activated   nonskid shoes/slippers when out of bed   safety round/check completed     Problem: Skin Injury Risk Increased  Goal: Skin Health and Integrity  Outcome: Ongoing (see interventions/notes)  Intervention: Optimize Skin Protection  Flowsheets  Taken 04/28/2021 0541  Pressure Reduction Techniques:   (L,M,H,VH) Maximal Remobilization   (L,M,H,VH) Manage Moisture, Nutrition & Shear   frequent weight shift encouraged  Pressure Reduction Devices:   (L.M,H,VH) Pressure redistributing mattress utilized   (L.M,H,VH) Specialty Bed utilized   (M,H,VH) Use Repositioning Devices or Pillows  Skin Protection:   adhesive use limited   incontinence pads utilized   tubing/devices free from skin contact   transparent dressing maintained   preventative decubiti skin protection foam dressing applied/intact  Activity Management:   activity adjusted per tolerance   activity clustered for rest periods   dorsiflexion, plantar flexion enc   ROM, active encouraged  Head of Bed [facility]) Positioning: 30 degrees  Taken 04/27/2021 2300  Activity Management:  activity adjusted per tolerance   activity clustered for rest periods   dorsiflexion, plantar flexion enc   ROM, active encouraged  Head of Bed [facility]) Positioning: HOB at 20-30 degrees  Taken 04/27/2021 2030  Pressure Reduction Techniques:   (L,M,H,VH) Maximal Remobilization   (L,M,H,VH) Manage Moisture, Nutrition & Shear   frequent weight shift encouraged  Pressure Reduction Devices:   (L.M,H,VH) Pressure redistributing mattress  utilized   (L.M,H,VH) Specialty Bed utilized   (M,H,VH) Use Repositioning Devices or Pillows  Skin Protection:   adhesive use limited   incontinence pads utilized   tubing/devices free from skin contact   preventative decubiti skin protection foam dressing applied/intact  Activity Management:   activity clustered for rest periods   activity adjusted per tolerance   dorsiflexion, plantar flexion enc   ROM, active encouraged  Head of Bed (HOB) Positioning: HOB at 20-30 degrees  Intervention: Promote and Optimize Oral Intake  Flowsheets  Taken 04/28/2021 0541  Oral Nutrition Promotion:   calorie-dense liquids provided   rest periods promoted  Taken 04/27/2021 2030  Oral Nutrition Promotion: calorie-dense liquids provided     Problem: Wound Healing Progression  Goal: Optimal Wound Healing  Outcome: Ongoing (see interventions/notes)  Intervention: Promote Wound Healing  Flowsheets  Taken 04/28/2021 0541  Oral Nutrition Promotion:   calorie-dense liquids provided   rest periods promoted  Pressure Reduction Techniques:   (L,M,H,VH) Maximal Remobilization   (L,M,H,VH) Manage Moisture, Nutrition & Shear   frequent weight shift encouraged  Pressure Reduction Devices:   (L.M,H,VH) Pressure redistributing mattress utilized   (L.M,H,VH) Specialty Bed utilized   (M,H,VH) Use Repositioning Devices or Pillows  Sleep/Rest Enhancement:   awakenings minimized   noise level reduced   regular sleep/rest pattern promoted   relaxation techniques promoted   room darkened  Pain Management Interventions:   care clustered   pain management plan reviewed with patient/caregiver   medication offered but refused   relaxation techniques promoted   quiet environment facilitated   pillow support provided  Activity Management:   activity adjusted per tolerance   activity clustered for rest periods   dorsiflexion, plantar flexion enc   ROM, active encouraged  Taken 04/27/2021 2300  Activity Management:   activity adjusted per tolerance   activity  clustered for rest periods   dorsiflexion, plantar flexion enc   ROM, active encouraged  Taken 04/27/2021 2030  Oral Nutrition Promotion: calorie-dense liquids provided  Pressure Reduction Techniques:   (L,M,H,VH) Maximal Remobilization   (L,M,H,VH) Manage Moisture, Nutrition & Shear   frequent weight shift encouraged  Pressure Reduction Devices:   (L.M,H,VH) Pressure redistributing mattress utilized   (L.M,H,VH) Specialty Bed utilized   (M,H,VH) Use Repositioning Devices or Pillows  Sleep/Rest Enhancement: regular sleep/rest pattern promoted  Activity Management:   activity clustered for rest periods   activity adjusted per tolerance   dorsiflexion, plantar flexion enc   ROM, active encouraged

## 2021-04-28 NOTE — Nurses Notes (Signed)
Latest Reference Range & Units Most Recent   aPTT 24.2 - 37.5 seconds 47.4 (H)  04/28/21 10:55   (H): Data is abnormally high  PTT is below therapeutic range at this time. Heparin gtt was increased by 2 Units/kg/hr and is now running at 21 Units/kg/hr. Next PTT scheduled for 1800.     Koren Shiver, RN STUDENT  04/28/2021, 11:56

## 2021-04-28 NOTE — Nurses Notes (Signed)
Latest Reference Range & Units 04/28/21 04:47   PHOSPHORUS 2.3 - 4.0 mg/dL 1.6 (L)     Notified Dr. Stacey Drain of above lab result via text page. Awaiting orders/return call.

## 2021-04-28 NOTE — Nurses Notes (Addendum)
Paged Surgical Oncology [phone]: He has had several poops in the last 24 hours, was wondering if you wanted to test for cdiffe? [phone]"    12:09 - Surg Onc called back and said since they just held his stool softeners, that they would like to wait to see if the frequent, loose stools persist for another 24-48 hours before testing for cdiff.

## 2021-04-28 NOTE — Care Plan (Signed)
04/28/21 1110   Therapist Pager   OT Assigned/ Pager # Alleen Kehm [phone]   Rehab Session   Document Type rehab contact note   Total OT Minutes: 0   Daily Activity AM-PAC/6-clicks Score   Patient Mobility Barrier Patient participating in other care at bedside  (Pt called to be cleaned from incontinence of BM.)

## 2021-04-28 NOTE — Care Plan (Signed)
Medical Nutrition Therapy Calorie Count Note  I: Estimated Needs:  Energy Calorie Requirements: 8889-1694 cal (28-30 cal/75.9 kg)per day   Protein Requirements (gms/day): 88-110 g prot (1.2-1.5 g/73.7 kg)per day     Calorie Count day 1: 2090 calories, 99 grams of protein, which met 91-99 % of estimated calorie needs and 90-113 % of estimated protein needs.  Pt consumed 4 ensure and 2 magic cups which provides 1980 calories.     Tracey Harries, NDTR  04/28/2021, 08:57  Pager#[phone]

## 2021-04-28 NOTE — Nurses Notes (Signed)
Latest Reference Range & Units 04/28/21 04:46   aPTT 24.2 - 37.5 seconds 58.3 (H)   PTT therapeutic at this time. No changes made to heparin gtt per adult low intensity heparin dosing chart. Heparin gtt continues to run at 19 units/kg/h. Next PTT scheduled for 1100. Will monitor.

## 2021-04-29 LAB — BASIC METABOLIC PANEL
ANION GAP: 7 mmol/L (ref 4–13)
BUN/CREA RATIO: 16 (ref 6–22)
BUN: 10 mg/dL (ref 8–25)
CALCIUM: 8.2 mg/dL — ABNORMAL LOW (ref 8.8–10.2)
CHLORIDE: 100 mmol/L (ref 96–111)
CO2 TOTAL: 25 mmol/L (ref 23–31)
CREATININE: 0.64 mg/dL — ABNORMAL LOW (ref 0.75–1.35)
ESTIMATED GFR: >90 mL/min/BSA (ref >=60)
GLUCOSE: 114 mg/dL (ref 65–125)
POTASSIUM: 4.1 mmol/L (ref 3.5–5.1)
SODIUM: 132 mmol/L — ABNORMAL LOW (ref 136–145)

## 2021-04-29 LAB — ADULT ROUTINE BLOOD CULTURE, SET OF 2 BOTTLES (BACTERIA AND YEAST)
BLOOD CULTURE, ROUTINE: ABNORMAL — CR
BLOOD CULTURE, ROUTINE: NO GROWTH

## 2021-04-29 LAB — MAGNESIUM: MAGNESIUM: 1.9 mg/dL (ref 1.8–2.6)

## 2021-04-29 LAB — CBC
HCT: 25.9 % — ABNORMAL LOW (ref 38.9–52.0)
HGB: 8.2 g/dL — ABNORMAL LOW (ref 13.4–17.5)
MCH: 28.6 pg (ref 26.0–32.0)
MCHC: 31.7 g/dL (ref 31.0–35.5)
MCV: 90.2 fL (ref 78.0–100.0)
MPV: 11.3 fL (ref 8.7–12.5)
PLATELETS: 447 10*3/uL — ABNORMAL HIGH (ref 150–400)
RBC: 2.87 10*6/uL — ABNORMAL LOW (ref 4.50–6.10)
RDW-CV: 17.5 % — ABNORMAL HIGH (ref 11.5–15.5)
WBC: 10.8 10*3/uL (ref 3.7–11.0)

## 2021-04-29 LAB — POC BLOOD GLUCOSE (RESULTS)
GLUCOSE, POC: 108 mg/dl — ABNORMAL HIGH (ref 70–105)
GLUCOSE, POC: 140 mg/dl — ABNORMAL HIGH (ref 70–105)
GLUCOSE, POC: 151 mg/dl — ABNORMAL HIGH (ref 70–105)
GLUCOSE, POC: 107 mg/dl — ABNORMAL HIGH (ref 70–105)

## 2021-04-29 LAB — PHOSPHORUS: PHOSPHORUS: 1.9 mg/dL — ABNORMAL LOW (ref 2.3–4.0)

## 2021-04-29 LAB — PTT (PARTIAL THROMBOPLASTIN TIME)
APTT: 79.8 seconds — ABNORMAL HIGH (ref 24.2–37.5)
APTT: 82.1 seconds — ABNORMAL HIGH (ref 24.2–37.5)
APTT: 67.5 seconds — ABNORMAL HIGH (ref 24.2–37.5)

## 2021-04-29 MED ORDER — LIDOCAINE (PF) 10 MG/ML (1 %) INJECTION SOLUTION
0.5000 mL | Freq: Once | INTRAMUSCULAR | Status: AC
Start: 2021-04-29 — End: 2021-04-29
  Administered 2021-04-29: 0.5 mL via INTRADERMAL

## 2021-04-29 MED ORDER — SODIUM CHLORIDE 0.9 % (FLUSH) INJECTION SYRINGE
20.0000 mL | INJECTION | INTRAMUSCULAR | Status: DC | PRN
Start: 2021-04-29 — End: 2021-05-28

## 2021-04-29 MED ORDER — SODIUM PHOSPHATE 3 MMOL/ML INTRAVENOUS SOLUTION
30.0000 mmol | Freq: Once | INTRAVENOUS | Status: DC
Start: 2021-04-29 — End: 2021-04-29
  Filled 2021-04-29: qty 10

## 2021-04-29 MED ORDER — SODIUM CHLORIDE 0.9 % (FLUSH) INJECTION SYRINGE
10.0000 mL | INJECTION | Freq: Three times a day (TID) | INTRAMUSCULAR | Status: DC
Start: 2021-04-29 — End: 2021-05-28
  Administered 2021-04-29: 10 mL
  Administered 2021-04-29: 0 mL
  Administered 2021-04-30 (×3): 10 mL
  Administered 2021-05-01 (×2): 0 mL
  Administered 2021-05-01: 10 mL
  Administered 2021-05-02: 0 mL
  Administered 2021-05-02 – 2021-05-03 (×3): 10 mL
  Administered 2021-05-03: 0 mL
  Administered 2021-05-03: 10 mL
  Administered 2021-05-04: 30 mL
  Administered 2021-05-04: 0 mL
  Administered 2021-05-04: 10 mL
  Administered 2021-05-05: 0 mL
  Administered 2021-05-05: 10 mL
  Administered 2021-05-05 – 2021-05-06 (×2): 0 mL
  Administered 2021-05-06: 10 mL
  Administered 2021-05-06 – 2021-05-07 (×4): 0 mL
  Administered 2021-05-08: 10 mL
  Administered 2021-05-08: 20 mL
  Administered 2021-05-08: 0 mL
  Administered 2021-05-09: 10 mL
  Administered 2021-05-09: 20 mL
  Administered 2021-05-09 – 2021-05-10 (×2): 10 mL
  Administered 2021-05-10 (×2): 20 mL
  Administered 2021-05-11: 0 mL
  Administered 2021-05-11 – 2021-05-12 (×3): 10 mL
  Administered 2021-05-12 (×2): 0 mL
  Administered 2021-05-13: 10 mL
  Administered 2021-05-13: 0 mL
  Administered 2021-05-13 – 2021-05-14 (×2): 10 mL
  Administered 2021-05-14: 0 mL
  Administered 2021-05-14: 10 mL
  Administered 2021-05-15: 20 mL
  Administered 2021-05-15 – 2021-05-16 (×3): 10 mL
  Administered 2021-05-16: 0 mL
  Administered 2021-05-16: 10 mL
  Administered 2021-05-17: 0 mL
  Administered 2021-05-17 (×2): 10 mL
  Administered 2021-05-18: 0 mL
  Administered 2021-05-18: 10 mL
  Administered 2021-05-18 – 2021-05-19 (×3): 0 mL
  Administered 2021-05-19 – 2021-05-20 (×3): 30 mL
  Administered 2021-05-20: 0 mL
  Administered 2021-05-21: 20 mL
  Administered 2021-05-21 (×2): 0 mL
  Administered 2021-05-22 (×2): 10 mL
  Administered 2021-05-22: 0 mL
  Administered 2021-05-23: 10 mL
  Administered 2021-05-23: 0 mL
  Administered 2021-05-23 – 2021-05-25 (×5): 10 mL
  Administered 2021-05-25: 0 mL
  Administered 2021-05-25: 10 mL
  Administered 2021-05-26 – 2021-05-28 (×7): 0 mL

## 2021-04-29 MED ORDER — OCTREOTIDE ACETATE 100 MCG/ML INJECTION SOLUTION
100.0000 ug | Freq: Three times a day (TID) | INTRAMUSCULAR | Status: DC
Start: 2021-04-29 — End: 2021-05-07
  Administered 2021-04-29 – 2021-05-07 (×25): 100 ug via INTRAVENOUS
  Filled 2021-04-29 (×29): qty 1

## 2021-04-29 MED ORDER — WATER FOR INJECTION, STERILE INTRAVENOUS SOLUTION
INTRAVENOUS | Status: AC
Start: 2021-04-29 — End: 2021-04-30
  Filled 2021-04-29: qty 500

## 2021-04-29 MED ORDER — OXYCODONE 5 MG TABLET
2.5000 mg | ORAL_TABLET | ORAL | Status: DC | PRN
Start: 2021-04-29 — End: 2021-05-21
  Administered 2021-05-01 – 2021-05-10 (×3): 2.5 mg via ORAL
  Filled 2021-04-29 (×3): qty 1

## 2021-04-29 MED ORDER — OXYCODONE 5 MG TABLET
5.0000 mg | ORAL_TABLET | ORAL | Status: DC | PRN
Start: 2021-04-29 — End: 2021-05-21
  Administered 2021-04-29 – 2021-05-19 (×20): 5 mg via ORAL
  Filled 2021-04-29 (×20): qty 1

## 2021-04-29 MED ORDER — SODIUM PHOSPHATE 3 MMOL/ML INTRAVENOUS SOLUTION
30.0000 mmol | Freq: Once | INTRAVENOUS | Status: AC
Start: 2021-04-29 — End: 2021-04-29
  Administered 2021-04-29: 30 mmol via INTRAVENOUS
  Administered 2021-04-29: 0 mmol via INTRAVENOUS
  Filled 2021-04-29: qty 10

## 2021-04-29 NOTE — Care Management Notes (Signed)
[facility] Management Note    Patient Name: [name]  Date of Birth: Sep 09, 1945  Sex: male  Date/Time of Admission: 04/03/2021  2:01 PM  Room/Bed: 924/A  Payor: MEDICARE / Plan: MEDICARE PART A AND B / Product Type: Medicare /    LOS: 26 days   Primary Care Providers:  [place] (General)    Admitting Diagnosis:  Intra abdominal hemorrhage [R58]    Assessment:      04/29/21 1146   Assessment Details   Assessment Type Continued Assessment   Date of Care Management Update 04/29/21   Date of Next DCP Update 04/30/21   Care Management Plan   Discharge Planning Status plan in progress   Projected Discharge Date 05/07/21   Discharge plan discussed with: Patient;Spouse   Discharge Needs Assessment   Discharge Facility/Level of Care Needs Acute Rehab Placement/Return (not psych)(code 62)         Discharge Plan:  Acute Rehab Placement/Return (not psych) (code 65)  Per service, pt continues to require extensive medical treatment. Continues to receive IV abx with end date 4/9 per ID. Monitoring nutritional status- PICC placed with plan for TPN to be initiated today. Drains remain in place. PT/OT rec IPR for pt. MSW met with pt and spouse to follow up on choice of facility. Pt spouse reports that she plans to visit facilities over the weekend prior to making a decision. Discussed that obtaining auth from the [place] may take time so it is important to begin to work toward d/c plan as soon as possible, pt and spouse verbalized understanding. If pt is to d/c on TPN (unclear at this time), could be a potential placement barrier. [facility] is able to accept pt with TPN, phoned to speak with [facility]uss [phone]) as this is one facility pt is considering - left VM requesting call back to determine if they would accept a pt on TPN. Continuing to follow for d/c planning.     The patient will continue to be evaluated for developing discharge needs.     Case Manager:  Rosaura Carpenter, [place]  Phone: [phone]

## 2021-04-29 NOTE — Nurses Notes (Signed)
Latest Reference Range & Units 04/29/21 09:48   aPTT 24.2 - 37.5 seconds 82.1 (H)   (H): Data is abnormally high    Heparin adjusted to 21 units/kg/hr per protocol

## 2021-04-29 NOTE — Nurses Notes (Addendum)
44- Paged phlebotomy if they could obtain patients PTT lab, due to unsuccessful stick.   0110- Phlebotomy called that they were unsuccessful and could not get labs.  Nelly.Gin- Paged stat nurse if they could get labs.   28- Stat nurse paged back stating patient will be put on the list to get labs.    Willie.Bhat- Paged MD [place] that phos came back at 1.9, no orders in.

## 2021-04-29 NOTE — Nurses Notes (Signed)
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 Patient is A&O X4. Report of pain as a 3/10, declines PRN pain medication at this time. Talked about plan of care for the day. Assessment charted per flowsheet. High fall and contact precautions maintained. Personal items and call light within reach. No other needs identified at this time, will continue to monitor.

## 2021-04-29 NOTE — Progress Notes (Signed)
[facility]  Surgical Oncology  Progress Note      Marrio, Scribner, 76 y.o. male  Date of Birth:  1945-05-29  Date of Admission:  04/03/2021  Date of service: 04/29/2021    Assessment:  This is a 76 y.o. 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 He underwent massive transfusion protocol and ex lap on 04/03/21 at an outside facility with ligation of a bleeding vessel in the liver bed. Patient was subsequently transferred to [facility] postoperaively, intubated. Patient was subsequently extubated later that day on 3/4. 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      Plan/Recommendations:  - I/D consult for fungemia   - Blood cultures 04/26/21 NGTD   - ECHO no evidence of endocarditis    - Continue micafungin, daptomycin, and meropenem  - Diet: Continue diabetic diet   Calorie count started 04/27/21   PICC line ordered   Plan to start TPN today   JP drain with chylous output   - Peripancreatic fluid collections   - Will start octreotide today   - GI scope on 3/23 unable to stent PD duct, patient with pancreatic divisim              - IR consulted; RUQ drain placement and empiric embolization 3/10              - Blood cultures 3/9: + pseudomonas    - Cultures form R JP: Pseudomonas and enterococcus              - Repeat Blood cultures 3/12 negative   - ID consulted; continue Daptomycin and meropenum (stop date 4/7)  - PE   - s/p IVC filter placement 3/17   - Aggressive pulm toilet, supp off O2   - Continue low dose heparin  - IVF: Plasmalyte 50 ml/hr  - Midline fistula with ostomy bag added to monitor output  - OOB TID  - PT/OT ordered  - Dispo Continue floor status    Subjective:   No issues overnight. Denies any abdominal pain.     Objective  Filed Vitals:    04/28/21 1600 04/28/21 1945 04/28/21 2220 04/29/21 0423   BP:  117/75 119/64 126/80 125/81   Pulse: 95 99 88 100   Resp: (!) '24 20 18 16   '$ Temp:  36.6 C (97.9 F) 36.7 C (98.1 F) 37.4 C (99.3 F)   SpO2: 93% 94% 96% 93%     Physical Exam:   GEN:  AOx4, resting in bed, no acute distress  PULM: Normal respiratory effort.   CV:  Tachycardic, regular rhythm   ABD:   Abdomen soft, minimal ttp RUQ, nondistended. JP in place in RUQ,  Lower midline incision with feculent drainage present with ostomy bag.  Pigtail catheter present with chylous output.  MS: Atraumatic. Moves all extremities.  NEURO:   Alert and oriented to person, place and time.  Cranial nerves grossly intact.    Integumentary:  Pink, warm, and dry  PSYCHOSOCIAL: Pleasant.  Normal affect.     Labs  Results for orders placed or performed during the hospital encounter of 04/03/21 (from the past 24 hour(s))   PTT (PARTIAL THROMBOPLASTIN TIME)   Result Value Ref Range    APTT 47.4 (H) 24.2 - 37.5 seconds    Narrative  Therapeutic range for unfractionated heparin is 60-100 seconds.   PTT (PARTIAL THROMBOPLASTIN TIME) - ONCE   Result Value Ref Range    APTT 49.2 (H) 24.2 - 37.5 seconds    Narrative    Therapeutic range for unfractionated heparin is 60-100 seconds.   PTT (PARTIAL THROMBOPLASTIN TIME) - ONCE   Result Value Ref Range    APTT 79.8 (H) 24.2 - 37.5 seconds    Narrative    Therapeutic range for unfractionated heparin is 60-100 seconds.   PHOSPHORUS   Result Value Ref Range    PHOSPHORUS 1.9 (L) 2.3 - 4.0 mg/dL   CBC   Result Value Ref Range    WBC 10.8 3.7 - 11.0 x10^3/uL    RBC 2.87 (L) 4.50 - 6.10 x10^6/uL    HGB 8.2 (L) 13.4 - 17.5 g/dL    HCT 25.9 (L) 38.9 - 52.0 %    MCV 90.2 78.0 - 100.0 fL    MCH 28.6 26.0 - 32.0 pg    MCHC 31.7 31.0 - 35.5 g/dL    RDW-CV 17.5 (H) 11.5 - 15.5 %    PLATELETS 447 (H) 150 - 400 x10^3/uL    MPV 11.3 8.7 - 12.5 fL   MAGNESIUM   Result Value Ref Range    MAGNESIUM 1.9 1.8 - 2.6 mg/dL   BASIC METABOLIC PANEL   Result Value Ref Range    SODIUM 132 (L) 136 - 145 mmol/L     POTASSIUM 4.1 3.5 - 5.1 mmol/L    CHLORIDE 100 96 - 111 mmol/L    CO2 TOTAL 25 23 - 31 mmol/L    ANION GAP 7 4 - 13 mmol/L    CALCIUM 8.2 (L) 8.8 - 10.2 mg/dL    GLUCOSE 114 65 - 125 mg/dL    BUN 10 8 - 25 mg/dL    CREATININE 0.64 (L) 0.75 - 1.35 mg/dL    BUN/CREA RATIO 16 6 - 22    ESTIMATED GFR >90 >=60 mL/min/BSA   CBC/DIFF - AM ONCE *Canceled*    Narrative    The following orders were created for panel order CBC/DIFF - AM ONCE.  Procedure                               Abnormality         Status                     ---------                               -----------         ------                       Please view results for these tests on the individual orders.   POC BLOOD GLUCOSE (RESULTS)   Result Value Ref Range    GLUCOSE, POC 145 (H) 70 - 105 mg/dl   POC BLOOD GLUCOSE (RESULTS)   Result Value Ref Range    GLUCOSE, POC 178 (H) 70 - 105 mg/dl   POC BLOOD GLUCOSE (RESULTS)   Result Value Ref Range    GLUCOSE, POC 136 (H) 70 - 105 mg/dl   POC BLOOD GLUCOSE (RESULTS)   Result Value Ref Range    GLUCOSE, POC 108 (H) 70 - 105 mg/dl  I/O:  Date 04/28/21 0700 - 04/29/21 0659 04/29/21 0700 - 04/30/21 0659   Shift 0700-1459 1500-2259 2300-0659 24 Hour Total 0700-1459 1500-2259 2300-0659 24 Hour Total   INTAKE   P.O. 240 100 0 340         Oral 240 100 0 340       I.V.(mL/kg/hr) 989.21(1.94) 522.55(0.82)  788.07         Med (IV) Flush Volume  10  10         Heparin Volume 59.69 120.05  179.74         Volume Infused (electrolyte-A (PLASMALYTE-A) premix infusion) 205.83 392.5  598.33       IV Piggyback 200 66  266         Volume (DAPTOmycin (CUBICIN) 800 mg in NS 50 mL IVPB)  66  66         Volume (fluconazole (DIFLUCAN) 400 mg in NS 200 mL premix IVPB) 200   200       Shift Total(mL/kg) 174.08(1.44) 818.56(3.14) 0(0) 9702.63(78.58)       OUTPUT   Urine(mL/kg/hr) 850(1.34) 1075(1.69) 1300 3225         Urine (Voided) 850 1075 1300 3225       Drains 375 210 125 710         JP Drain Output ([place] Drain  Right;Upper Abdomen) 0 0  0         Drain Output (Drain (Miscellaneous) #51F pigtail catheter for RLQ fluid collection Lower;Right;Anterior Abdomen) 375 210 125 710         Drain Output (Drain (Miscellaneous) peds ostomy bag Lower;Medial Abdomen) 0 0  0       Stool             Stool Occurrence 2 x 1 x  3 x       Shift Total(mL/kg) 8502(77.41) 2878(67.67) 2094(70.96) 3935(49.5)       Weight (kg) 79.5 79.5 79.5 79.5 79.5 79.5 79.5 79.5       Radiology       Current Medications:  Current Facility-Administered Medications   Medication Dose Route Frequency   . acetaminophen (TYLENOL) tablet  500 mg Oral Q4H   . atorvastatin (LIPITOR) tablet  40 mg Oral Daily with Breakfast   . benzonatate (TESSALON) capsule  100 mg Oral Q8H PRN   . D5W 250 mL flush bag   Intravenous Q15 Min PRN   . DAPTOmycin (CUBICIN) 800 mg in NS 50 mL IVPB  10 mg/kg (Adjusted) Intravenous Q24H   . [Held by provider] docusate sodium (COLACE) capsule  100 mg Oral 2x/day   . electrolyte-A (PLASMALYTE-A) premix infusion   Intravenous Continuous   . elvitegravir-cobicistat-emtricitrabine-tenofovir (GENVOYA) 150-150-200-10 mg per tablet  1 Tablet Oral Daily   . fluconazole (DIFLUCAN) 400 mg in NS 200 mL premix IVPB  400 mg Intravenous Q24H   . gabapentin (NEURONTIN) capsule  300 mg Oral 3x/day   . heparin 25,000 units in NS 250 mL infusion  12 Units/kg/hr (Adjusted) Intravenous Continuous   . HYDROmorphone (DILAUDID) 1 mg/mL injection  0.2 mg Intravenous Q3H PRN   . meropenem (MERREM) 1 g in NS 100 mL IVPB  1 g Intravenous Q8H   . methocarbamol (ROBAXIN) tablet  500 mg Oral 4x/day   . multivitamin-minerals-folic acid-lycopene-lutein (CERTAVITE SENIOR) tablet  1 Tablet Oral Q24H   . NS 250 mL flush bag   Intravenous Q15 Min PRN   . NS flush syringe  2-6 mL Intracatheter Q8HRS   .  NS flush syringe  2-6 mL Intracatheter Q1 MIN PRN   . NS flush syringe  10-30 mL Intracatheter Q8HRS   . NS flush syringe  20-30 mL Intracatheter Q1 MIN PRN   . NS flush syringe   10-30 mL Intracatheter Q8HRS   . NS flush syringe  20-30 mL Intracatheter Q1 MIN PRN   . octreotide (SANDOSTATIN) 100 mcg/mL injection  100 mcg Intravenous 3x/day   . ondansetron (ZOFRAN) 2 mg/mL injection  4 mg Intravenous Q6H PRN   . oxyCODONE (ROXICODONE) immediate release tablet  5 mg Oral Q4H PRN   . oxyCODONE (ROXICODONE) immediate releaste tablet  10 mg Oral Q4H PRN   . pantoprazole (PROTONIX) delayed release tablet  40 mg Oral Q12H   . prochlorperazine (COMPAZINE) 5 mg/mL injection  10 mg Intravenous Q6H PRN   . [Held by provider] sennosides-docusate sodium (SENOKOT-S) 8.6-'50mg'$  per tablet  1 Tablet Oral 2x/day   . sodium phosphate 30 mmol in D5W 100 mL IVPB  30 mmol Intravenous Once   . SSIP insulin R human (HUMULIN R) 100 units/mL injection  0-12 Units Subcutaneous 4x/day PRN     Allene Pyo, PA-C  04/29/2021, 07:00

## 2021-04-29 NOTE — Care Plan (Signed)
Medical Nutrition Therapy Calorie Count Note  I: Estimated Needs:  Energy Calorie Requirements: 7001-7494 cal (28-30 cal/75.9 kg)per day   Protein Requirements (gms/day): 88-110 g prot (1.2-1.5 g/73.7 kg)per day    Calorie Count day 1: 2090 calories, 99 grams of protein, which met 91-99 % of estimated calorie needs and 90-113 % of estimated protein needs.  Pt consumed 4 ensure and 2 magic cups which provides 1980 calories.     Calorie Count day 2: Pt CC sheet did not transfer. Will extend CC 1 day.    Tracey Harries, NDTR  04/29/2021, 10:11  Pager#[phone]

## 2021-04-30 LAB — POC BLOOD GLUCOSE (RESULTS)
GLUCOSE, POC: 137 mg/dl — ABNORMAL HIGH (ref 70–105)
GLUCOSE, POC: 210 mg/dl — ABNORMAL HIGH (ref 70–105)
GLUCOSE, POC: 156 mg/dl — ABNORMAL HIGH (ref 70–105)
GLUCOSE, POC: 164 mg/dl — ABNORMAL HIGH (ref 70–105)

## 2021-04-30 LAB — MAGNESIUM: MAGNESIUM: 2.1 mg/dL (ref 1.8–2.6)

## 2021-04-30 LAB — CBC
HCT: 27.7 % — ABNORMAL LOW (ref 38.9–52.0)
HGB: 8.3 g/dL — ABNORMAL LOW (ref 13.4–17.5)
MCH: 27.6 pg (ref 26.0–32.0)
MCHC: 30 g/dL — ABNORMAL LOW (ref 31.0–35.5)
MCV: 92 fL (ref 78.0–100.0)
MPV: 12.2 fL (ref 8.7–12.5)
PLATELETS: 437 10*3/uL — ABNORMAL HIGH (ref 150–400)
RBC: 3.01 10*6/uL — ABNORMAL LOW (ref 4.50–6.10)
RDW-CV: 17.5 % — ABNORMAL HIGH (ref 11.5–15.5)
WBC: 10.2 10*3/uL (ref 3.7–11.0)

## 2021-04-30 LAB — BASIC METABOLIC PANEL
ANION GAP: 8 mmol/L (ref 4–13)
BUN/CREA RATIO: 15 (ref 6–22)
BUN: 11 mg/dL (ref 8–25)
CALCIUM: 8.5 mg/dL — ABNORMAL LOW (ref 8.8–10.2)
CHLORIDE: 96 mmol/L (ref 96–111)
CO2 TOTAL: 29 mmol/L (ref 23–31)
CREATININE: 0.73 mg/dL — ABNORMAL LOW (ref 0.75–1.35)
ESTIMATED GFR: >90 mL/min/BSA (ref >=60)
GLUCOSE: 146 mg/dL — ABNORMAL HIGH (ref 65–125)
POTASSIUM: 4.2 mmol/L (ref 3.5–5.1)
SODIUM: 133 mmol/L — ABNORMAL LOW (ref 136–145)

## 2021-04-30 LAB — PTT (PARTIAL THROMBOPLASTIN TIME)
APTT: 45.1 seconds — ABNORMAL HIGH (ref 24.2–37.5)
APTT: 57.2 seconds — ABNORMAL HIGH (ref 24.2–37.5)
APTT: 64.5 seconds — ABNORMAL HIGH (ref 24.2–37.5)

## 2021-04-30 LAB — PHOSPHORUS: PHOSPHORUS: 2.8 mg/dL (ref 2.3–4.0)

## 2021-04-30 LAB — BLUE TOP TUBE

## 2021-04-30 MED ORDER — WATER FOR INJECTION, STERILE INTRAVENOUS SOLUTION
INTRAVENOUS | Status: AC
Start: 2021-04-30 — End: 2021-05-01
  Filled 2021-04-30: qty 500

## 2021-04-30 MED ORDER — HEPARIN 25,000 UNIT/250 ML (100 UNIT/ML) IN NS IV SOLN
18.0000 [IU]/kg/h | INTRAVENOUS | Status: DC
Start: 2021-04-30 — End: 2021-04-30

## 2021-04-30 MED ORDER — HEPARIN 25,000 UNIT/250 ML (100 UNIT/ML) IN NS IV SOLN
18.0000 [IU]/kg/h | INTRAVENOUS | Status: DC
Start: 2021-04-30 — End: 2021-05-08
  Administered 2021-04-30 – 2021-05-01 (×4): 18 [IU]/kg/h via INTRAVENOUS
  Administered 2021-05-01 (×4): 19 [IU]/kg/h via INTRAVENOUS
  Administered 2021-05-01: 18 [IU]/kg/h via INTRAVENOUS
  Administered 2021-05-01 – 2021-05-02 (×3): 19 [IU]/kg/h via INTRAVENOUS
  Administered 2021-05-02 (×2): 20 [IU]/kg/h via INTRAVENOUS
  Administered 2021-05-02 (×2): 19 [IU]/kg/h via INTRAVENOUS
  Administered 2021-05-02: 20 [IU]/kg/h via INTRAVENOUS
  Administered 2021-05-03: 21 [IU]/kg/h via INTRAVENOUS
  Administered 2021-05-03: 22 [IU]/kg/h via INTRAVENOUS
  Administered 2021-05-03: 18 [IU]/kg/h via INTRAVENOUS
  Administered 2021-05-03 (×2): 19 [IU]/kg/h via INTRAVENOUS
  Administered 2021-05-03: 21 [IU]/kg/h via INTRAVENOUS
  Administered 2021-05-04: 20 [IU]/kg/h via INTRAVENOUS
  Administered 2021-05-04: 0 [IU]/kg/h via INTRAVENOUS
  Administered 2021-05-04 (×2): 22 [IU]/kg/h via INTRAVENOUS
  Administered 2021-05-04 (×2): 23 [IU]/kg/h via INTRAVENOUS
  Administered 2021-05-05 (×2): 20 [IU]/kg/h via INTRAVENOUS
  Administered 2021-05-06: 0 [IU]/kg/h via INTRAVENOUS
  Filled 2021-04-30 (×8): qty 250

## 2021-04-30 NOTE — Care Plan (Signed)
Medical Nutrition Therapy Calorie Count Note  I: Estimated Needs:  Energy Calorie Requirements: 5320-2334 cal (28-30 cal/75.9 kg)per day   Protein Requirements (gms/day): 88-110 g prot (1.2-1.5 g/73.7 kg)per day    Calorie Count day1:2090calories, 99grams of protein, which met 91-99% of estimated calorie needs and 90-113% of estimated protein needs. Pt consumed 4 ensure and 2 magic cups which provides 1980 calories.    Calorie Count day2:348calories, 12grams of protein, which met 15-17% of estimated calorie needs and 11-14 % of estimated protein needs. Per CC sheet pt did not have dinner documented. Dinner was state by pt's family. Pt did not have any ensure per family statement.    Tracey Harries, NDTR  04/30/2021, 09:25  Pager#[phone]

## 2021-04-30 NOTE — Care Plan (Signed)
Medical Nutrition Therapy Follow Up        SUBJECTIVE : pt currently tolerating diet, on calorie count, ate well day 1 but concern for increase midline fistula output so TPN started.     OBJECTIVE:     Current Diet Order/Nutrition Support: 2400-2600 ADA diet, LF supplemented with magic cup BID, ensure plus HP TID    TPN started yesterday: 1000 cal, 50 g prot, 500 dextrose cal, 300 lipids at 74m/hr       Height Used for Calculations: 175.3 cm (5' 9.02")  Weight Used For Calculations: 75.9 kg (167 lb 5.3 oz) (standing weight on 3/3)  Weight trends: 3/14- 84.4 kg (bed) ; 3/23- 84.4 kg (no edema noted on flow sheet); 3/24- 79.5 kg   BMI (kg/m2): 24.75  BMI Assessment: BMI 18.5-24.9: normal  Ideal Body Weight (IBW) (kg): 73.73  % Ideal Body Weight: 102.94     Usual Body Weight: 81.3 kg (179 lb 3.7 oz)  Weight Loss:  (intentional weight loss)      Estimated Needs:    Energy Calorie Requirements: 2100-2300 cal (28-30 cal/75.9 kg)  Protein Requirements (gms/day): 88-110 g prot (1.2-1.5 g/73.7 kg)   Fluid Requirements: 2100-2300 ml (28-30 ml/75.9 k)       Comments: 76y.o. 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 He underwent massive transfusion protocol and ex lap on 04/03/21 at an outside facility with ligation of a bleeding vessel in the liver bed. [facility]. Patient was subsequently extubated later that day on 3/4. 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           Plan/Interventions :   Continue current diet and supplements.  Calorie count extended to run through weekend.    At this time, plan is to provide supplemental TPN meeting 50% of his needs which is what ordered.    Goal  TPN provided in case necessary   Rate: ~79 ml/hr  2200 kcal /day = 29 kcal/kg  110 grams Protein  = 1.5 grams/ kg  1100 Dextrose kcals, GIR 3  660 IL kcals or 0.9 grams Fat/kg    Monitor potassium, magnesium and phosphorus daily along with blood sugars and BMP and electrolytes can be adjusted as needed.      Monitor LFTs and triglycerides weekly while on TPN  Monitor weekly weights.   Will continue to follow.     Nutrition Diagnosis: Altered GI function related to midline fistula draining stool as evidenced by need for TPN: in progress     DDierdre Searles RD, LD, CSonoma3/31/2023, 11:48  Pager #[phone]

## 2021-04-30 NOTE — Care Plan (Signed)
[facility]  Physical Therapy Progress Note      Patient Name: [name]  Date of Birth: 22-Feb-1945  Height:  175.3 cm (5' 9.02")  Weight:  79.5 kg (175 lb 4.3 oz)  Room/Bed: 924/A  Payor: MEDICARE / Plan: MEDICARE PART A AND B / Product Type: Medicare /     Assessment:     Mr. Kurtz was alert, anxious about activity, and agreeable to PT tx. He demonstrated improvement with supine to sit and increased tol to gt. He continues to demonstrate generalized weakness, decreased activity tol, impaired balance, and impaired mobility. His mobility is not currently adequate for safe d/c to home but he has good rehab potential. Rec IRF at d/c.    Discharge Needs:   Equipment Recommendation: TBD  Discharge Disposition: inpatient rehabilitation facility    JUSTIFICATION OF DISCHARGE RECOMMENDATION   Based on current diagnosis, functional performance prior to admission, and current functional performance, this patient requires continued PT services in inpatient rehabilitation facility in order to achieve significant functional improvements in these deficit areas: aerobic capacity/endurance, gait, locomotion, and balance, muscle performance.      Plan:   Continue to follow patient according to established plan of care.  The risks/benefits of therapy have been discussed with the patient/caregiver and he/she is in agreement with the established plan of care.     Subjective & Objective:        04/30/21 1257   Therapist Pager   PT Assigned/ Pager # [name] [phone]   Rehab Session   Document Type therapy progress note (daily note)   Total PT Minutes: 39   Patient Effort good   Symptoms Noted During/After Treatment fatigue   General Information   Patient Profile Reviewed yes   Patient/Family/Caregiver Comments/Observations Pt was resting in bed; waking as PT arrived. He was agreeable to PT tx but did express apprehension about activity due to weakness.   Medical Lines PIV Line;Peripheral  Drain;PICC Line   Respiratory Status nasal cannula  (2 LPM)   Existing Precautions/Restrictions contact isolation;fall precautions;full code;oxygen therapy device and L/min  (MRSA)   General Observations of Patient Pt was seen at b/s on 9w for PT tx with permission of b/s nurse.   Mutuality/Individual Preferences   Individualized Care Needs OOB with FWW and assist x 2 vs sara stedy depending on pt fatigue and confidence.   Pre Treatment Status   Pre Treatment Patient Status Patient supine in bed;Call light within reach;Telephone within reach;Sitter select activated   Support Present Pre Treatment  None   Communication Pre Treatment  Nurse   Cognitive Assessment/Interventions   Behavior/Mood Observations alert;anxious;cooperative   Attention WNL/WFL   Follows Commands WFL   Pain Assessment   Pretreatment Pain Rating 0/10 - no pain   Posttreatment Pain Rating 0/10 - no pain   Pre/Posttreatment Pain Comment pt reported being nauseated but denied pain   Mobility Assessment/Training   Comment Pt was assisted to sitting EOB. He sat for several minutes. He stood to [facility]. He was able to use urinal in standing then took a few steps to chair. He rested then was taken into hallway for amb. He ambulated x 1 then returned to room via chair. He was reclined in chair at b/s at end of tx.   Bed Mobility Assessment/Treatment   Bed Mobility, Assistive Device Head of Bed Elevated   Supine-Sit Independence minimum assist (75% patient effort)   Sit to Supine, Independence not tested  Safety Issues decreased use of arms for pushing/pulling;decreased use of legs for bridging/pushing;impaired trunk control for bed mobility   Impairments balance impaired;coordination impaired;endurance;postural control impaired;strength decreased   Comment Pt was able to move his legs over EOB with increased time and effort; min A to pull his trunk to upright.   Transfer Assessment/Treatment   Sit-Stand Independence moderate assist (50% patient  effort);verbal cues required   Stand-Sit Independence moderate assist (50% patient effort);verbal cues required   Sit-Stand-Sit, Assist Device walker, front wheeled   Bed-Chair Independence minimum assist (75% patient effort);verbal cues required   Bed-Chair-Bed Assist Device walker, front wheeled   Transfer Safety Issues balance decreased during turns;sequencing ability decreased;step length decreased;weight-shifting ability decreased   Transfer Impairments balance impaired;coordination impaired;postural control impaired;strength decreased   Transfer Comment Pt needed verbal cues for hand placement   Gait Assessment/Treatment   Independence  contact guard assist;1 person + 1 person to manage equipment   Assistive Device  walker, front wheeled   Distance in Feet 3 ft bed to chair; 30 ft in hall   Gait Speed decreased   Deviations  cadence decreased;double stance time increased;limb motion velocity decreased;step length decreased;stride length decreased;swing-to-stance ratio decreased;toe-to-floor clearance decreased;weight-shifting ability decreased   Safety Issues  step length decreased;weight-shifting ability decreased;balance decreased during turns   Impairments  balance impaired;endurance;postural control impaired;strength decreased   Comment Pt had short step height and length which improved initially as distance progressed then began to decrease again with fatigue. He needed assist x 1 plus assist for chair follow and equipment.   Balance Skill Training   Comment with FWW for standing   Sitting Balance: Static fair balance   Sitting, Dynamic (Balance) fair - balance   Sit-to-Stand Balance poor balance   Standing Balance: Static fair - balance   Standing Balance: Dynamic poor balance   Systems Impairment Contributing to Balance Disturbance musculoskeletal   Identified Impairments Contributing to Balance Disturbance impaired motor control;impaired postural control;decreased strength   Post Treatment Status    Post Treatment Patient Status Patient sitting in bedside chair or w/c;Call light within reach;Telephone within reach;Patient sitting on [facility]   Support Present Post Treatment  None  (CA assisted with amb in hall)   [place] With patient   Basic Mobility Am-PAC/6Clicks Score (APPROVED Staff)   Turning in bed without bedrails 3   Lying on back to sitting on edge of flat bed 3   Moving to and from a bed to a chair 3   Standing up from chair 2   Walk in room 3   Climbing 3-5 steps with railing 1   6 Clicks Raw Score total 15   Standardized (t-scale) score 36.97   CMS 0-100% Score 50.4   CMS Modifier CK   Patient Mobility Goal (JHHLM) 4- Move to chair 3X/day   Exercise/Activity Level Performed 7- Walked 25 feet or more   Physical Therapy Clinical Impression   Assessment Mr. Mankey was alert, anxious about activity, and agreeable to PT tx. He demonstrated improvement with supine to sit and increased tol to gt. He continues to demonstrate generalized weakness, decreased activity tol, impaired balance, and impaired mobility. His mobility is not currently adequate for safe d/c to home but he has good rehab potential. Rec IRF at d/c.   Anticipated Equipment Needs at Discharge (PT) TBD   Anticipated Discharge Disposition inpatient rehabilitation facility       Therapist:  [place], PT   Pager #: [phone]

## 2021-04-30 NOTE — Care Management Notes (Addendum)
[facility]  Care Management Note    Patient Name: [name]  Date of Birth: Feb 25, 1945  Sex: male  Date/Time of Admission: 04/03/2021  2:01 PM  Room/Bed: 924/A  Payor: MEDICARE / Plan: MEDICARE PART A AND B / Product Type: Medicare /    LOS: 27 days   Primary Care Providers:  [place] (General)    Admitting Diagnosis:  Intra abdominal hemorrhage [R58]    Assessment:      04/30/21 1145   Assessment Details   Assessment Type Continued Assessment   Date of Care Management Update 04/30/21   Date of Next DCP Update 04/30/21   Care Management Plan   Discharge Planning Status plan in progress   Projected Discharge Date 05/07/21   Discharge Needs Assessment   Discharge Facility/Level of Care Needs Acute Rehab Placement/Return (not psych)(code 76)   Spoke with service, Pt needs acute rehab at discharge. MSW called and spoke with Charity [phone]. Charity stated the [place] will only cover [facility]. MSW also informed Charity that Pt might have TPN at discharge. Charity informed MSW that some places will not be able to accept Pt if that happens. MSW then contacted Mardene Celeste from the [facility] [phone]. [phone] and notified her of the plan for the Pt.     Pt was declined by [facility].     Addendum    MSW called and left a message for Pt's wife [name] (163) [phone] to inform her of what the [place] said.     Discharge Plan:  Acute Rehab Placement/Return (not psych) (code 49)      The patient will continue to be evaluated for developing discharge needs.     Case Manager: Elizabeth Palau, [place]  Phone: [phone]

## 2021-04-30 NOTE — Nurses Notes (Addendum)
[name] paged for clarification on new heparin order. As patient has had 2 therapeutic PTT values, patient will be kept at 23 unit/kg/hr    1802 - Heparin protocol changed to Adult thrombotic protocol with new weight. Will change to 18 units/kg/hr

## 2021-04-30 NOTE — Progress Notes (Signed)
[facility]  Surgical Oncology  Progress Note      [name], [name], 76 y.o. male  Date of Birth:  1945-04-29  Date of Admission:  04/03/2021  Date of service: 04/30/2021    Assessment:  This is a 76 y.o. 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 He underwent massive transfusion protocol and ex lap on 04/03/21 at an outside facility with ligation of a bleeding vessel in the liver bed. Patient was subsequently transferred to [facility] postoperaively, intubated. Patient was subsequently extubated later that day on 3/4. 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      Plan/Recommendations:  - I/D consult for fungemia   - Blood cultures 04/26/21 NGTD   - ECHO no evidence of endocarditis    - Continue fluconazole, daptomycin, and meropenem  - Diet: Continue diabetic diet   Calorie count started 04/27/21   PICC line ordered   Continue TPN today   JP drain with chylous output   - Peripancreatic fluid collections   - Continue octreotide   - GI scope on 3/23 unable to stent PD duct, patient with pancreatic divisim              - IR consulted; RUQ drain placement and empiric embolization 3/10              - Blood cultures 3/9: + pseudomonas    - Cultures form R JP: Pseudomonas and enterococcus              - Repeat Blood cultures 3/12 negative   - ID consulted; continue Daptomycin and meropenum (stop date 4/7)  - PE   - s/p IVC filter placement 3/17   - Aggressive pulm toilet, supp off O2   - Continue low dose heparin  - IVF: Plasmalyte 50 ml/hr  - Midline fistula with ostomy bag to monitor output  - OOB TID  - PT/OT ordered  - Dispo Continue floor status    Subjective:   Patient states he has continued to have episodes of nausea and vomiting. PRN Zofran helps with nausea. Denies fevers and chills.    Objective  Filed Vitals:     04/29/21 1900 04/30/21 0000 04/30/21 0123 04/30/21 0339   BP: 128/89 122/84  120/86   Pulse: 81 89  91   Resp: '16 16 16 16   '$ Temp: 36.9 C (98.4 F) 37.1 C (98.8 F)  37.2 C (99 F)   SpO2: 94% 95%  95%     Physical Exam:   GEN:  AOx4, resting in bed, no acute distress  PULM: Normal respiratory effort.   CV:  Tachycardic, regular rhythm   ABD:   Abdomen soft, minimal ttp RUQ, nondistended. JP in place in RUQ,  Lower midline incision with feculent drainage present with ostomy bag.  Pigtail catheter present with chylous output.  MS: Atraumatic. Moves all extremities.  NEURO:   Alert and oriented to person, place and time.  Cranial nerves grossly intact.    Integumentary:  Pink, warm, and dry  PSYCHOSOCIAL: Pleasant.  Normal affect.     Labs  Results for orders placed or performed during the hospital encounter of 04/03/21 (from the past 24 hour(s))   PTT (PARTIAL THROMBOPLASTIN TIME) - ONCE   Result Value Ref Range    APTT 82.1 (H)  24.2 - 37.5 seconds    Narrative    Therapeutic range for unfractionated heparin is 60-100 seconds.   PTT (PARTIAL THROMBOPLASTIN TIME) - ONCE   Result Value Ref Range    APTT 67.5 (H) 24.2 - 37.5 seconds    Narrative    Therapeutic range for unfractionated heparin is 60-100 seconds.   PTT (PARTIAL THROMBOPLASTIN TIME) - ONCE   Result Value Ref Range    APTT 45.1 (H) 24.2 - 37.5 seconds    Narrative    Therapeutic range for unfractionated heparin is 60-100 seconds.   PHOSPHORUS   Result Value Ref Range    PHOSPHORUS 2.8 2.3 - 4.0 mg/dL   CBC   Result Value Ref Range    WBC 10.2 3.7 - 11.0 x10^3/uL    RBC 3.01 (L) 4.50 - 6.10 x10^6/uL    HGB 8.3 (L) 13.4 - 17.5 g/dL    HCT 27.7 (L) 38.9 - 52.0 %    MCV 92.0 78.0 - 100.0 fL    MCH 27.6 26.0 - 32.0 pg    MCHC 30.0 (L) 31.0 - 35.5 g/dL    RDW-CV 17.5 (H) 11.5 - 15.5 %    PLATELETS 437 (H) 150 - 400 x10^3/uL    MPV 12.2 8.7 - 12.5 fL   MAGNESIUM   Result Value Ref Range    MAGNESIUM 2.1 1.8 - 2.6 mg/dL   BASIC METABOLIC PANEL   Result Value Ref  Range    SODIUM 133 (L) 136 - 145 mmol/L    POTASSIUM 4.2 3.5 - 5.1 mmol/L    CHLORIDE 96 96 - 111 mmol/L    CO2 TOTAL 29 23 - 31 mmol/L    ANION GAP 8 4 - 13 mmol/L    CALCIUM 8.5 (L) 8.8 - 10.2 mg/dL    GLUCOSE 146 (H) 65 - 125 mg/dL    BUN 11 8 - 25 mg/dL    CREATININE 0.73 (L) 0.75 - 1.35 mg/dL    BUN/CREA RATIO 15 6 - 22    ESTIMATED GFR >90 >=60 mL/min/BSA   EXTRA TUBES    Narrative    The following orders were created for panel order EXTRA TUBES.  Procedure                               Abnormality         Status                     ---------                               -----------         ------                     BLUE TOP STMH[[id number]]                                    Final result                 Please view results for these tests on the individual orders.   BLUE TOP TUBE   Result Value Ref Range    RAINBOW/EXTRA TUBE AUTO RESULT Yes    POC BLOOD GLUCOSE (RESULTS)   Result Value Ref Range    GLUCOSE,  POC 140 (H) 70 - 105 mg/dl   POC BLOOD GLUCOSE (RESULTS)   Result Value Ref Range    GLUCOSE, POC 151 (H) 70 - 105 mg/dl   POC BLOOD GLUCOSE (RESULTS)   Result Value Ref Range    GLUCOSE, POC 107 (H) 70 - 105 mg/dl   POC BLOOD GLUCOSE (RESULTS)   Result Value Ref Range    GLUCOSE, POC 137 (H) 70 - 105 mg/dl       I/O:  Date 04/29/21 0700 - 04/30/21 0659 04/30/21 0700 - 05/01/21 0659   Shift 2440-1027 1500-2259 2300-0659 24 Hour Total 0700-1459 1500-2259 2300-0659 24 Hour Total   INTAKE   P.O. 360   360         Oral 360   360       I.V.(mL/kg/hr)  550(0.86)  550(0.29)         Volume Infused (electrolyte-A (PLASMALYTE-A) premix infusion)  550  550       IV Piggyback 312   312 200   200     Volume (fluconazole (DIFLUCAN) 400 mg in NS 200 mL premix IVPB) 200   200 200   200     Volume (sodium phosphate 30 mmol in D5W 100 mL IVPB) 112   112       Shift Total(mL/kg) 672(8.45) 550(6.92)  2536(64.40) 200(2.52)   200(2.52)   OUTPUT   Urine(mL/kg/hr) 1100(1.73) 1500(2.36) 650(1.02) 3250(1.7)         Urine  (Voided) 1100 1500 650 3250       Drains 75   75         JP Drain Output (Jackson Pratt Drain Right;Upper Abdomen) 0   0         Drain Output (Drain (Miscellaneous) #78F pigtail catheter for RLQ fluid collection Lower;Right;Anterior Abdomen) 75   75         Drain Output (Drain (Miscellaneous) peds ostomy bag Lower;Medial Abdomen) 0   0       Shift Total(mL/kg) 3474(25.95) 6387(56.43) 650(8.18) 3295(18.84)       Weight (kg) 79.5 79.5 79.5 79.5 79.5 79.5 79.5 79.5       Radiology       Current Medications:  Current Facility-Administered Medications   Medication Dose Route Frequency   . acetaminophen (TYLENOL) tablet  500 mg Oral Q4H   . adult custom parenteral nutrition   Intravenous Continuous   . atorvastatin (LIPITOR) tablet  40 mg Oral Daily with Breakfast   . benzonatate (TESSALON) capsule  100 mg Oral Q8H PRN   . D5W 250 mL flush bag   Intravenous Q15 Min PRN   . DAPTOmycin (CUBICIN) 800 mg in NS 50 mL IVPB  10 mg/kg (Adjusted) Intravenous Q24H   . [Held by provider] docusate sodium (COLACE) capsule  100 mg Oral 2x/day   . electrolyte-A (PLASMALYTE-A) premix infusion   Intravenous Continuous   . elvitegravir-cobicistat-emtricitrabine-tenofovir (GENVOYA) 150-150-200-10 mg per tablet  1 Tablet Oral Daily   . fluconazole (DIFLUCAN) 400 mg in NS 200 mL premix IVPB  400 mg Intravenous Q24H   . gabapentin (NEURONTIN) capsule  300 mg Oral 3x/day   . heparin 25,000 units in NS 250 mL infusion  12 Units/kg/hr (Adjusted) Intravenous Continuous   . meropenem (MERREM) 1 g in NS 100 mL IVPB  1 g Intravenous Q8H   . methocarbamol (ROBAXIN) tablet  500 mg Oral 4x/day   . multivitamin-minerals-folic acid-lycopene-lutein (CERTAVITE SENIOR) tablet  1 Tablet Oral Q24H   . NS  250 mL flush bag   Intravenous Q15 Min PRN   . NS flush syringe  2-6 mL Intracatheter Q8HRS   . NS flush syringe  2-6 mL Intracatheter Q1 MIN PRN   . NS flush syringe  10-30 mL Intracatheter Q8HRS   . NS flush syringe  20-30 mL Intracatheter Q1 MIN PRN   . NS  flush syringe  10-30 mL Intracatheter Q8HRS   . NS flush syringe  20-30 mL Intracatheter Q1 MIN PRN   . NS flush syringe  10-30 mL Intracatheter Q8HRS   . NS flush syringe  20-30 mL Intracatheter Q1 MIN PRN   . octreotide (SANDOSTATIN) 100 mcg/mL injection  100 mcg Intravenous 3x/day   . ondansetron (ZOFRAN) 2 mg/mL injection  4 mg Intravenous Q6H PRN   . oxyCODONE (ROXICODONE) immediate release tablet  2.5 mg Oral Q4H PRN   . oxyCODONE (ROXICODONE) immediate release tablet  5 mg Oral Q4H PRN   . pantoprazole (PROTONIX) delayed release tablet  40 mg Oral Q12H   . prochlorperazine (COMPAZINE) 5 mg/mL injection  10 mg Intravenous Q6H PRN   . [Held by provider] sennosides-docusate sodium (SENOKOT-S) 8.6-'50mg'$  per tablet  1 Tablet Oral 2x/day   . SSIP insulin R human (HUMULIN R) 100 units/mL injection  0-12 Units Subcutaneous 4x/day PRN       Micheline Chapman, MD  Department of Family Medicine, PGY-1  Pager: [phone]

## 2021-04-30 NOTE — Nurses Notes (Signed)
Assessed and passed medications. Patient is A&O X4. Denies pain or NV. Talked about plan of care for the day. Assessment charted per flowsheet.  High fall precautions maintained.

## 2021-05-01 LAB — PTT (PARTIAL THROMBOPLASTIN TIME)
APTT: 60.4 seconds — ABNORMAL HIGH (ref 24.2–37.5)
APTT: 56.2 seconds — ABNORMAL HIGH (ref 24.2–37.5)
APTT: 69.4 seconds — ABNORMAL HIGH (ref 24.2–37.5)
APTT: 66.6 seconds — ABNORMAL HIGH (ref 24.2–37.5)

## 2021-05-01 LAB — BASIC METABOLIC PANEL
ANION GAP: 6 mmol/L (ref 4–13)
BUN/CREA RATIO: 14 (ref 6–22)
BUN: 10 mg/dL (ref 8–25)
CALCIUM: 8.2 mg/dL — ABNORMAL LOW (ref 8.8–10.2)
CHLORIDE: 99 mmol/L (ref 96–111)
CO2 TOTAL: 29 mmol/L (ref 23–31)
CREATININE: 0.7 mg/dL — ABNORMAL LOW (ref 0.75–1.35)
ESTIMATED GFR: >90 mL/min/BSA (ref >=60)
GLUCOSE: 154 mg/dL — ABNORMAL HIGH (ref 65–125)
POTASSIUM: 3.9 mmol/L (ref 3.5–5.1)
SODIUM: 134 mmol/L — ABNORMAL LOW (ref 136–145)

## 2021-05-01 LAB — CBC WITH DIFF
BASOPHIL #: <0.1 10*3/uL (ref <=0.20)
BASOPHIL %: 0 %
EOSINOPHIL #: 0.38 10*3/uL (ref <=0.50)
EOSINOPHIL %: 4 %
HCT: 25.4 % — ABNORMAL LOW (ref 38.9–52.0)
HGB: 7.8 g/dL — ABNORMAL LOW (ref 13.4–17.5)
IMMATURE GRANULOCYTE #: <0.1 10*3/uL (ref <0.10)
IMMATURE GRANULOCYTE %: 1 % (ref 0–1)
LYMPHOCYTE #: 4.12 10*3/uL (ref 1.00–4.80)
LYMPHOCYTE %: 42 %
MCH: 28 pg (ref 26.0–32.0)
MCHC: 30.7 g/dL — ABNORMAL LOW (ref 31.0–35.5)
MCV: 91 fL (ref 78.0–100.0)
MONOCYTE #: 0.74 10*3/uL (ref 0.20–1.10)
MONOCYTE %: 8 %
MPV: 11.5 fL (ref 8.7–12.5)
NEUTROPHIL #: 4.37 10*3/uL (ref 1.50–7.70)
NEUTROPHIL %: 45 %
PLATELETS: 344 10*3/uL (ref 150–400)
RBC: 2.79 10*6/uL — ABNORMAL LOW (ref 4.50–6.10)
RDW-CV: 17.4 % — ABNORMAL HIGH (ref 11.5–15.5)
WBC: 9.7 10*3/uL (ref 3.7–11.0)

## 2021-05-01 LAB — MAGNESIUM: MAGNESIUM: 2.1 mg/dL (ref 1.8–2.6)

## 2021-05-01 LAB — POC BLOOD GLUCOSE (RESULTS)
GLUCOSE, POC: 169 mg/dl — ABNORMAL HIGH (ref 70–105)
GLUCOSE, POC: 216 mg/dl — ABNORMAL HIGH (ref 70–105)
GLUCOSE, POC: 129 mg/dl — ABNORMAL HIGH (ref 70–105)
GLUCOSE, POC: 195 mg/dl — ABNORMAL HIGH (ref 70–105)

## 2021-05-01 LAB — ADULT ROUTINE BLOOD CULTURE, SET OF 2 BOTTLES (BACTERIA AND YEAST)
BLOOD CULTURE, ROUTINE: NO GROWTH
BLOOD CULTURE, ROUTINE: NO GROWTH

## 2021-05-01 LAB — PHOSPHORUS: PHOSPHORUS: 2.7 mg/dL (ref 2.3–4.0)

## 2021-05-01 MED ORDER — WATER FOR INJECTION, STERILE INTRAVENOUS SOLUTION
INTRAVENOUS | Status: AC
Start: 2021-05-01 — End: 2021-05-02
  Filled 2021-05-01: qty 500

## 2021-05-01 NOTE — Progress Notes (Signed)
[facility]  Surgical Oncology  Progress Note      [name], [name], 76 y.o. male  Date of Birth:  1945/04/26  Date of Admission:  04/03/2021  Date of service: 05/01/2021    Assessment:  This is a 76 y.o. 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 He underwent massive transfusion protocol and ex lap on 04/03/21 at an outside facility with ligation of a bleeding vessel in the liver bed. Patient was subsequently transferred to [facility] postoperaively, intubated. Patient was subsequently extubated later that day on 3/4. 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      Plan/Recommendations:  - I/D consult for fungemia   - Blood cultures 04/26/21 NGTD   - ECHO no evidence of endocarditis    - Continue fluconazole, daptomycin, and meropenem  - Diet: Continue clears   Calorie count finished   PICC line in place   Continue TPN today   JP drain with chylous output   - Peripancreatic fluid collections   - Continue octreotide   - GI scope on 3/23 unable to stent PD duct, patient with pancreatic divisim              - IR consulted; RUQ drain placement and empiric embolization 3/10              - Blood cultures 3/9: + pseudomonas    - Cultures form R JP: Pseudomonas and enterococcus              - Repeat Blood cultures 3/12 negative   - ID consulted; continue Daptomycin and meropenum (stop date 4/7)  - PE   - s/p IVC filter placement 3/17   - Aggressive pulm toilet, supp off O2   - Continue low dose heparin  - IVF: Plasmalyte 50 ml/hr  - Midline fistula with ostomy bag to monitor output  - OOB TID  - PT/OT ordered  - Dispo Continue floor status    Subjective:   Patient reports some dry heaving and one episode of small amount of vomiting. Denies fevers and chills.    Objective  Filed Vitals:    05/01/21 0017 05/01/21 0134 05/01/21  0359 05/01/21 0642   BP: (!) 155/86  (!) 154/89 (!) 149/77   Pulse: 88  90 85   Resp: '18 16 16 16   '$ Temp: 37.4 C (99.3 F)  36.7 C (98 F) 36.8 C (98.3 F)   SpO2: 95%  93% 93%     Physical Exam:   GEN:  AOx4, resting in bed, no acute distress  PULM: Normal respiratory effort.   CV:  Tachycardic, regular rhythm   ABD:   Abdomen soft, minimal ttp RUQ, nondistended. JP in place in RUQ,  Lower midline incision with feculent drainage present with ostomy bag.  Pigtail catheter present with chylous output.  MS: Atraumatic. Moves all extremities.  NEURO:   Alert and oriented to person, place and time.  Cranial nerves grossly intact.    Integumentary:  Pink, warm, and dry  PSYCHOSOCIAL: Pleasant.  Normal affect.     Labs  Results for orders placed or performed during the hospital encounter of 04/03/21 (from the past 24 hour(s))   PTT (PARTIAL THROMBOPLASTIN TIME) - ONCE   Result Value Ref Range    APTT 57.2 (H) 24.2 -  37.5 seconds    Narrative    Therapeutic range for unfractionated heparin is 60-100 seconds.   PTT (PARTIAL THROMBOPLASTIN TIME) - ONCE   Result Value Ref Range    APTT 64.5 (H) 24.2 - 37.5 seconds    Narrative    Therapeutic range for unfractionated heparin is 60-100 seconds.   PTT (PARTIAL THROMBOPLASTIN TIME) - ONCE   Result Value Ref Range    APTT 60.4 (H) 24.2 - 37.5 seconds    Narrative    Therapeutic range for unfractionated heparin is 60-100 seconds.   CBC/DIFF    Narrative    The following orders were created for panel order CBC/DIFF.  Procedure                               Abnormality         Status                     ---------                               -----------         ------                     CBC WITH DIFF[[id number]]                Abnormal            Final result                 Please view results for these tests on the individual orders.   BASIC METABOLIC PANEL   Result Value Ref Range    SODIUM 134 (L) 136 - 145 mmol/L    POTASSIUM 3.9 3.5 - 5.1 mmol/L    CHLORIDE 99 96 - 111  mmol/L    CO2 TOTAL 29 23 - 31 mmol/L    ANION GAP 6 4 - 13 mmol/L    CALCIUM 8.2 (L) 8.8 - 10.2 mg/dL    GLUCOSE 154 (H) 65 - 125 mg/dL    BUN 10 8 - 25 mg/dL    CREATININE 0.70 (L) 0.75 - 1.35 mg/dL    BUN/CREA RATIO 14 6 - 22    ESTIMATED GFR >90 >=60 mL/min/BSA   PHOSPHORUS   Result Value Ref Range    PHOSPHORUS 2.7 2.3 - 4.0 mg/dL   MAGNESIUM   Result Value Ref Range    MAGNESIUM 2.1 1.8 - 2.6 mg/dL   CBC WITH DIFF   Result Value Ref Range    WBC 9.7 3.7 - 11.0 x10^3/uL    RBC 2.79 (L) 4.50 - 6.10 x10^6/uL    HGB 7.8 (L) 13.4 - 17.5 g/dL    HCT 25.4 (L) 38.9 - 52.0 %    MCV 91.0 78.0 - 100.0 fL    MCH 28.0 26.0 - 32.0 pg    MCHC 30.7 (L) 31.0 - 35.5 g/dL    RDW-CV 17.4 (H) 11.5 - 15.5 %    PLATELETS 344 150 - 400 x10^3/uL    MPV 11.5 8.7 - 12.5 fL    NEUTROPHIL % 45 %    LYMPHOCYTE % 42 %    MONOCYTE % 8 %    EOSINOPHIL % 4 %    BASOPHIL % 0 %    NEUTROPHIL # 4.37 1.50 - 7.70 x10^3/uL  LYMPHOCYTE # 4.12 1.00 - 4.80 x10^3/uL    MONOCYTE # 0.74 0.20 - 1.10 x10^3/uL    EOSINOPHIL # 0.38 <=0.50 x10^3/uL    BASOPHIL # <0.10 <=0.20 x10^3/uL    IMMATURE GRANULOCYTE % 1 0 - 1 %    IMMATURE GRANULOCYTE # <0.10 <0.10 x10^3/uL   PTT (PARTIAL THROMBOPLASTIN TIME)   Result Value Ref Range    APTT 56.2 (H) 24.2 - 37.5 seconds    Narrative    Therapeutic range for unfractionated heparin is 60-100 seconds.   POC BLOOD GLUCOSE (RESULTS)   Result Value Ref Range    GLUCOSE, POC 210 (H) 70 - 105 mg/dl   POC BLOOD GLUCOSE (RESULTS)   Result Value Ref Range    GLUCOSE, POC 156 (H) 70 - 105 mg/dl   POC BLOOD GLUCOSE (RESULTS)   Result Value Ref Range    GLUCOSE, POC 164 (H) 70 - 105 mg/dl   POC BLOOD GLUCOSE (RESULTS)   Result Value Ref Range    GLUCOSE, POC 169 (H) 70 - 105 mg/dl       I/O:  Date 04/30/21 0700 - 05/01/21 0659 05/01/21 0700 - 05/02/21 0659   Shift 0700-1459 1500-2259 2300-0659 24 Hour Total 0700-1459 1500-2259 2300-0659 24 Hour Total   INTAKE   P.O. 200 50 175 425         Oral 200 50 175 425       I.V.(mL/kg/hr)  400(0.63) 1285(2.02) 700.5(1.1) 2385.5(1.25)         Volume Infused (electrolyte-A (PLASMALYTE-A) premix infusion) 400 325 405.83 1130.83         Volume Infused (adult custom parenteral nutrition)  960  960         Volume Infused (adult custom parenteral nutrition)   294.67 294.67       IV Piggyback 200 66  266 200   200     Volume (DAPTOmycin (CUBICIN) 800 mg in NS 50 mL IVPB)  66  66         Volume (fluconazole (DIFLUCAN) 400 mg in NS 200 mL premix IVPB) 200   200 200   200   Shift Total(mL/kg) 800(10.06) 1401(17.62) 875.5(11.01) 3076.5(38.7) 200(2.52)   200(2.52)   OUTPUT   Urine(mL/kg/hr) 300(0.47) 850(1.34) 950(1.49) 2100(1.1) 300   300     Urine (Voided) 300 [phone] 300   300   Drains 310  160 470         JP Drain Output (Jackson Pratt Drain Right;Upper Abdomen) '10  10 20         '$ Drain Output (Drain (Miscellaneous) #85F pigtail catheter for RLQ fluid collection Lower;Right;Anterior Abdomen) 200  100 300         Drain Output (Drain (Miscellaneous) peds ostomy bag Lower;Medial Abdomen) 100  50 150       Shift Total(mL/kg) 610(7.67) 850(10.69) 7262(03.55) 9741(63.84) 300(3.77)   300(3.77)   Weight (kg) 79.5 79.5 79.5 79.5 79.5 79.5 79.5 79.5       Radiology       Current Medications:  Current Facility-Administered Medications   Medication Dose Route Frequency    acetaminophen (TYLENOL) tablet  500 mg Oral Q4H    adult custom parenteral nutrition   Intravenous Continuous    atorvastatin (LIPITOR) tablet  40 mg Oral Daily with Breakfast    benzonatate (TESSALON) capsule  100 mg Oral Q8H PRN    D5W 250 mL flush bag   Intravenous Q15 Min PRN    DAPTOmycin (CUBICIN) 800 mg  in NS 50 mL IVPB  10 mg/kg (Adjusted) Intravenous Q24H    [Held by provider] docusate sodium (COLACE) capsule  100 mg Oral 2x/day    electrolyte-A (PLASMALYTE-A) premix infusion   Intravenous Continuous    elvitegravir-cobicistat-emtricitrabine-tenofovir (GENVOYA) 150-150-200-10 mg per tablet  1 Tablet Oral Daily    fluconazole (DIFLUCAN) 400  mg in NS 200 mL premix IVPB  400 mg Intravenous Q24H    gabapentin (NEURONTIN) capsule  300 mg Oral 3x/day    heparin 25,000 units in NS 250 mL infusion  18 Units/kg/hr (Adjusted) Intravenous Continuous    meropenem (MERREM) 1 g in NS 100 mL IVPB  1 g Intravenous Q8H    methocarbamol (ROBAXIN) tablet  500 mg Oral 4x/day    multivitamin-minerals-folic acid-lycopene-lutein (CERTAVITE SENIOR) tablet  1 Tablet Oral Q24H    NS 250 mL flush bag   Intravenous Q15 Min PRN    NS flush syringe  2-6 mL Intracatheter Q8HRS    NS flush syringe  2-6 mL Intracatheter Q1 MIN PRN    NS flush syringe  10-30 mL Intracatheter Q8HRS    NS flush syringe  20-30 mL Intracatheter Q1 MIN PRN    NS flush syringe  10-30 mL Intracatheter Q8HRS    NS flush syringe  20-30 mL Intracatheter Q1 MIN PRN    NS flush syringe  10-30 mL Intracatheter Q8HRS    NS flush syringe  20-30 mL Intracatheter Q1 MIN PRN    octreotide (SANDOSTATIN) 100 mcg/mL injection  100 mcg Intravenous 3x/day    ondansetron (ZOFRAN) 2 mg/mL injection  4 mg Intravenous Q6H PRN    oxyCODONE (ROXICODONE) immediate release tablet  2.5 mg Oral Q4H PRN    oxyCODONE (ROXICODONE) immediate release tablet  5 mg Oral Q4H PRN    pantoprazole (PROTONIX) delayed release tablet  40 mg Oral Q12H    prochlorperazine (COMPAZINE) 5 mg/mL injection  10 mg Intravenous Q6H PRN    [Held by provider] sennosides-docusate sodium (SENOKOT-S) 8.6-'50mg'$  per tablet  1 Tablet Oral 2x/day    SSIP insulin R human (HUMULIN R) 100 units/mL injection  0-12 Units Subcutaneous 4x/day PRN       [name], MD, 10:24  05/01/2021  PGY-2 General Surgery    This is a late entry for 05/01/2021.  I saw and examined this patient with the inpatient resident team on 05/01/2021.  Please see the resident's note, which I have carefully reviewed, for full details. I agree with the findings and plan of care as documented in the resident's note.  Any additions/exceptions are edited/noted.    Status quo.  No significant changes.   Examination is unchanged from prior days.  WBC normal at 9.7.  Continue current management.    Illene Silver, MD 05/02/2021 10:35  Associate Professor of Surgery  Division of [facility]

## 2021-05-01 NOTE — Care Plan (Signed)
Problem: Adult Inpatient Plan of Care  Goal: Plan of Care Review  Outcome: Ongoing (see interventions/notes)  Goal: Patient-Specific Goal (Individualized)  Outcome: Ongoing (see interventions/notes)  Flowsheets (Taken 05/01/2021 2317)  Individualized Care Needs: PRN nausea meds w/ meds  Goal: Absence of Hospital-Acquired Illness or Injury  Outcome: Ongoing (see interventions/notes)  Goal: Optimal Comfort and Wellbeing  Outcome: Ongoing (see interventions/notes)  Goal: Rounds/Family Conference  Outcome: Ongoing (see interventions/notes)     Problem: Fall Injury Risk  Goal: Absence of Fall and Fall-Related Injury  Outcome: Ongoing (see interventions/notes)     Problem: Skin Injury Risk Increased  Goal: Skin Health and Integrity  Outcome: Ongoing (see interventions/notes)     Problem: Wound Healing Progression  Goal: Optimal Wound Healing  Outcome: Ongoing (see interventions/notes)     Problem: Acute Rehab Services Goal & Intervention Plan  Goal: Bathing Goal  Description: Stand Alone Therapy Goal  Outcome: Ongoing (see interventions/notes)  Goal: Bed Mobility Goal  Description: Stand Alone Therapy Goal  Outcome: Ongoing (see interventions/notes)  Goal: Caregiver Training Goal  Description: Stand Alone Therapy Goal  Outcome: Ongoing (see interventions/notes)  Goal: Cognition Goal  Description: Stand Alone Therapy Goal  Outcome: Ongoing (see interventions/notes)  Goal: Cognition Goals, SLP  Description: Stand Alone Therapy Goal  Outcome: Ongoing (see interventions/notes)  Goal: Communication Goals, SLP  Description: Stand Alone Therapy Goal  Outcome: Ongoing (see interventions/notes)  Goal: Dysphagia Goals, SLP  Description: Stand Alone Therapy Goal  Outcome: Ongoing (see interventions/notes)  Goal: Eating Self-Feeding Goal  Description: Stand Alone Therapy Goal  Outcome: Ongoing (see interventions/notes)  Goal: Gait Training Goal  Description: Stand Alone Therapy Goal  Outcome: Ongoing (see interventions/notes)  Goal:  Grooming Goal  Description: Stand Alone Therapy Goal  Outcome: Ongoing (see interventions/notes)  Goal: Home Management Goal  Description: Stand Alone Therapy Goal  Outcome: Ongoing (see interventions/notes)  Goal: Interprofessional Goal  Description: Stand Alone Therapy Goal  Outcome: Ongoing (see interventions/notes)  Goal: LB Dressing Goal  Description: Stand Alone Therapy Goal  Outcome: Ongoing (see interventions/notes)  Goal: Occupational Therapy Goals  Description: Stand Alone Therapy Goal  Outcome: Ongoing (see interventions/notes)  Goal: Physical Therapy Goal  Description: Stand Alone Therapy Goal  Outcome: Ongoing (see interventions/notes)  Goal: Range of Motion Goal  Description: Stand Alone Therapy Goal  Outcome: Ongoing (see interventions/notes)  Goal: Strength Goal  Description: Stand Alone Therapy Goal  Outcome: Ongoing (see interventions/notes)  Goal: Toileting Goal  Description: Stand Alone Therapy Goal  Outcome: Ongoing (see interventions/notes)  Goal: Goal Transfer Training  Description: Stand Alone Therapy Goal  Outcome: Ongoing (see interventions/notes)  Goal: UB Dressing Goal  Description: Stand Alone Therapy Goal  Outcome: Ongoing (see interventions/notes)

## 2021-05-01 NOTE — Care Plan (Signed)
Problem: Adult Inpatient Plan of Care  Goal: Plan of Care Review  Outcome: Ongoing (see interventions/notes)  Goal: Patient-Specific Goal (Individualized)  Outcome: Ongoing (see interventions/notes)  Goal: Absence of Hospital-Acquired Illness or Injury  Outcome: Ongoing (see interventions/notes)  Goal: Optimal Comfort and Wellbeing  Outcome: Ongoing (see interventions/notes)  Goal: Rounds/Family Conference  Outcome: Ongoing (see interventions/notes)     Problem: Fall Injury Risk  Goal: Absence of Fall and Fall-Related Injury  Outcome: Ongoing (see interventions/notes)     Problem: Skin Injury Risk Increased  Goal: Skin Health and Integrity  Outcome: Ongoing (see interventions/notes)     Problem: Wound Healing Progression  Goal: Optimal Wound Healing  Outcome: Ongoing (see interventions/notes)     Problem: Acute Rehab Services Goal & Intervention Plan  Goal: Bathing Goal  Description: Stand Alone Therapy Goal  Outcome: Ongoing (see interventions/notes)  Goal: Bed Mobility Goal  Description: Stand Alone Therapy Goal  Outcome: Ongoing (see interventions/notes)  Goal: Caregiver Training Goal  Description: Stand Alone Therapy Goal  Outcome: Ongoing (see interventions/notes)  Goal: Cognition Goal  Description: Stand Alone Therapy Goal  Outcome: Ongoing (see interventions/notes)  Goal: Cognition Goals, SLP  Description: Stand Alone Therapy Goal  Outcome: Ongoing (see interventions/notes)  Goal: Communication Goals, SLP  Description: Stand Alone Therapy Goal  Outcome: Ongoing (see interventions/notes)  Goal: Dysphagia Goals, SLP  Description: Stand Alone Therapy Goal  Outcome: Ongoing (see interventions/notes)  Goal: Eating Self-Feeding Goal  Description: Stand Alone Therapy Goal  Outcome: Ongoing (see interventions/notes)  Goal: Gait Training Goal  Description: Stand Alone Therapy Goal  Outcome: Ongoing (see interventions/notes)  Goal: Grooming Goal  Description: Stand Alone Therapy Goal  Outcome: Ongoing (see  interventions/notes)  Goal: Home Management Goal  Description: Stand Alone Therapy Goal  Outcome: Ongoing (see interventions/notes)  Goal: Interprofessional Goal  Description: Stand Alone Therapy Goal  Outcome: Ongoing (see interventions/notes)  Goal: LB Dressing Goal  Description: Stand Alone Therapy Goal  Outcome: Ongoing (see interventions/notes)  Goal: Occupational Therapy Goals  Description: Stand Alone Therapy Goal  Outcome: Ongoing (see interventions/notes)  Goal: Physical Therapy Goal  Description: Stand Alone Therapy Goal  Outcome: Ongoing (see interventions/notes)  Goal: Range of Motion Goal  Description: Stand Alone Therapy Goal  Outcome: Ongoing (see interventions/notes)  Goal: Strength Goal  Description: Stand Alone Therapy Goal  Outcome: Ongoing (see interventions/notes)  Goal: Toileting Goal  Description: Stand Alone Therapy Goal  Outcome: Ongoing (see interventions/notes)  Goal: Goal Transfer Training  Description: Stand Alone Therapy Goal  Outcome: Ongoing (see interventions/notes)  Goal: UB Dressing Goal  Description: Stand Alone Therapy Goal  Outcome: Ongoing (see interventions/notes)

## 2021-05-01 NOTE — Care Plan (Signed)
Problem: Adult Inpatient Plan of Care  Goal: Plan of Care Review  Outcome: Ongoing (see interventions/notes)  Goal: Patient-Specific Goal (Individualized)  Outcome: Ongoing (see interventions/notes)  Flowsheets (Taken 04/30/2021 2030)  Individualized Care Needs: PRN meds for nausea given.  Anxieties, Fears or Concerns: Having intermittent nausea episodes.  Goal: Absence of Hospital-Acquired Illness or Injury  Outcome: Ongoing (see interventions/notes)  Intervention: Identify and Manage Fall Risk  Recent Flowsheet Documentation  Taken 05/01/2021 0437 by Milana Huntsman, RN  Safety Promotion/Fall Prevention: safety round/check completed  Taken 05/01/2021 0215 by Milana Huntsman, RN  Safety Promotion/Fall Prevention: activity supervised  Taken 05/01/2021 0015 by Milana Huntsman, RN  Safety Promotion/Fall Prevention: safety round/check completed  Taken 04/30/2021 2030 by Milana Huntsman, RN  Safety Promotion/Fall Prevention:   safety round/check completed   activity supervised   fall prevention program maintained   motion sensor pad activated   nonskid shoes/slippers when out of bed  Intervention: Prevent Skin Injury  Recent Flowsheet Documentation  Taken 04/30/2021 2030 by Milana Huntsman, RN  Body Position:   fowlers ( 45-60 degrees)   heels elevated off mattress  Skin Protection:   adhesive use limited   transparent dressing maintained   tubing/devices free from skin contact  Intervention: Prevent and Manage VTE (Venous Thromboembolism) Risk  Recent Flowsheet Documentation  Taken 04/30/2021 2030 by Milana Huntsman, RN  VTE Prevention/Management:   anticoagulant therapy maintained   dorsiflexion/plantar flexion performed  Intervention: Prevent Infection  Recent Flowsheet Documentation  Taken 05/01/2021 0437 by Milana Huntsman, RN  Infection Prevention:   personal protective equipment utilized   promote handwashing   rest/sleep promoted   single patient room provided  Taken 05/01/2021 0215 by Milana Huntsman, RN  Infection  Prevention:   personal protective equipment utilized   promote handwashing   rest/sleep promoted   single patient room provided  Taken 05/01/2021 0015 by Milana Huntsman, RN  Infection Prevention: personal protective equipment utilized  Taken 04/30/2021 2030 by Milana Huntsman, RN  Infection Prevention:   personal protective equipment utilized   promote handwashing   rest/sleep promoted   single patient room provided   barrier precautions utilized  Goal: Optimal Comfort and Wellbeing  Outcome: Ongoing (see interventions/notes)  Intervention: Provide Person-Centered Care  Recent Flowsheet Documentation  Taken 04/30/2021 2030 by Milana Huntsman, [place] Relationship/Rapport:   care explained   questions answered   reassurance provided   thoughts/feelings acknowledged  Goal: Rounds/Family Conference  Outcome: Ongoing (see interventions/notes)     Problem: Fall Injury Risk  Goal: Absence of Fall and Fall-Related Injury  Outcome: Ongoing (see interventions/notes)  Intervention: Identify and Manage Contributors  Recent Flowsheet Documentation  Taken 05/01/2021 0437 by Milana Huntsman, RN  Medication Review/Management: medications reviewed  Taken 05/01/2021 0215 by Milana Huntsman, RN  Medication Review/Management: medications reviewed  Taken 05/01/2021 0015 by Milana Huntsman, RN  Medication Review/Management: medications reviewed  Taken 04/30/2021 2030 by Milana Huntsman, RN  Medication Review/Management: medications reviewed  Intervention: Promote Injury-Free Environment  Recent Flowsheet Documentation  Taken 05/01/2021 0437 by Milana Huntsman, RN  Safety Promotion/Fall Prevention: safety round/check completed  Taken 05/01/2021 0215 by Milana Huntsman, RN  Safety Promotion/Fall Prevention: activity supervised  Taken 05/01/2021 0015 by Milana Huntsman, RN  Safety Promotion/Fall Prevention: safety round/check completed  Taken 04/30/2021 2030 by Milana Huntsman, RN  Safety Promotion/Fall Prevention:   safety round/check  completed   activity supervised   fall prevention program maintained   motion sensor pad activated   nonskid shoes/slippers when out of  bed     Problem: Skin Injury Risk Increased  Goal: Skin Health and Integrity  Outcome: Ongoing (see interventions/notes)  Intervention: Optimize Skin Protection  Recent Flowsheet Documentation  Taken 04/30/2021 2030 by Milana Huntsman, RN  Pressure Reduction Techniques:   frequent weight shift encouraged   heels elevated off bed  Pressure Reduction Devices:   (M,H,VH) Use Repositioning Devices or Pillows   heels elevated off bed  Skin Protection:   adhesive use limited   transparent dressing maintained   tubing/devices free from skin contact  Activity Management: activity adjusted per tolerance  Head of Bed (HOB) Positioning: HOB at 45 degrees  Intervention: Promote and Optimize Oral Intake  Recent Flowsheet Documentation  Taken 04/30/2021 2030 by Milana Huntsman, RN  Oral Nutrition Promotion:   calorie-dense liquids provided   rest periods promoted     Problem: Wound Healing Progression  Goal: Optimal Wound Healing  Outcome: Ongoing (see interventions/notes)  Intervention: Promote Wound Healing  Recent Flowsheet Documentation  Taken 04/30/2021 2030 by Milana Huntsman, RN  Oral Nutrition Promotion:   calorie-dense liquids provided   rest periods promoted  Pressure Reduction Techniques:   frequent weight shift encouraged   heels elevated off bed  Pressure Reduction Devices:   (M,H,VH) Use Repositioning Devices or Pillows   heels elevated off bed  Sleep/Rest Enhancement: awakenings minimized  Activity Management: activity adjusted per tolerance     Problem: Acute Rehab Services Goal & Intervention Plan  Goal: Bathing Goal  Description: Stand Alone Therapy Goal  Outcome: Ongoing (see interventions/notes)  Goal: Bed Mobility Goal  Description: Stand Alone Therapy Goal  Outcome: Ongoing (see interventions/notes)  Goal: Caregiver Training Goal  Description: Stand Alone Therapy  Goal  Outcome: Ongoing (see interventions/notes)  Goal: Cognition Goal  Description: Stand Alone Therapy Goal  Outcome: Ongoing (see interventions/notes)  Goal: Cognition Goals, SLP  Description: Stand Alone Therapy Goal  Outcome: Ongoing (see interventions/notes)  Goal: Communication Goals, SLP  Description: Stand Alone Therapy Goal  Outcome: Ongoing (see interventions/notes)  Goal: Dysphagia Goals, SLP  Description: Stand Alone Therapy Goal  Outcome: Ongoing (see interventions/notes)  Goal: Eating Self-Feeding Goal  Description: Stand Alone Therapy Goal  Outcome: Ongoing (see interventions/notes)  Goal: Gait Training Goal  Description: Stand Alone Therapy Goal  Outcome: Ongoing (see interventions/notes)  Goal: Grooming Goal  Description: Stand Alone Therapy Goal  Outcome: Ongoing (see interventions/notes)  Goal: Home Management Goal  Description: Stand Alone Therapy Goal  Outcome: Ongoing (see interventions/notes)  Goal: Interprofessional Goal  Description: Stand Alone Therapy Goal  Outcome: Ongoing (see interventions/notes)  Goal: LB Dressing Goal  Description: Stand Alone Therapy Goal  Outcome: Ongoing (see interventions/notes)  Goal: Occupational Therapy Goals  Description: Stand Alone Therapy Goal  Outcome: Ongoing (see interventions/notes)  Goal: Physical Therapy Goal  Description: Stand Alone Therapy Goal  Outcome: Ongoing (see interventions/notes)  Goal: Range of Motion Goal  Description: Stand Alone Therapy Goal  Outcome: Ongoing (see interventions/notes)  Goal: Strength Goal  Description: Stand Alone Therapy Goal  Outcome: Ongoing (see interventions/notes)  Goal: Toileting Goal  Description: Stand Alone Therapy Goal  Outcome: Ongoing (see interventions/notes)  Goal: Goal Transfer Training  Description: Stand Alone Therapy Goal  Outcome: Ongoing (see interventions/notes)  Goal: UB Dressing Goal  Description: Stand Alone Therapy Goal  Outcome: Ongoing (see interventions/notes)

## 2021-05-02 LAB — CBC
HCT: 25.2 % — ABNORMAL LOW (ref 38.9–52.0)
HGB: 8.1 g/dL — ABNORMAL LOW (ref 13.4–17.5)
MCH: 30.7 pg (ref 26.0–32.0)
MCHC: 32.1 g/dL (ref 31.0–35.5)
MCV: 95.5 fL (ref 78.0–100.0)
MPV: 11.4 fL (ref 8.7–12.5)
PLATELETS: 395 10*3/uL (ref 150–400)
RBC: 2.64 10*6/uL — ABNORMAL LOW (ref 4.50–6.10)
RDW-CV: 17.7 % — ABNORMAL HIGH (ref 11.5–15.5)
WBC: 9 10*3/uL (ref 3.7–11.0)

## 2021-05-02 LAB — BASIC METABOLIC PANEL
ANION GAP: 7 mmol/L (ref 4–13)
BUN/CREA RATIO: 14 (ref 6–22)
BUN: 10 mg/dL (ref 8–25)
CALCIUM: 8.3 mg/dL — ABNORMAL LOW (ref 8.8–10.2)
CHLORIDE: 100 mmol/L (ref 96–111)
CO2 TOTAL: 29 mmol/L (ref 23–31)
CREATININE: 0.71 mg/dL — ABNORMAL LOW (ref 0.75–1.35)
ESTIMATED GFR: >90 mL/min/BSA (ref >=60)
GLUCOSE: 141 mg/dL — ABNORMAL HIGH (ref 65–125)
POTASSIUM: 3.9 mmol/L (ref 3.5–5.1)
SODIUM: 136 mmol/L (ref 136–145)

## 2021-05-02 LAB — PTT (PARTIAL THROMBOPLASTIN TIME)
APTT: 69.7 seconds — ABNORMAL HIGH (ref 24.2–37.5)
APTT: 69.6 seconds — ABNORMAL HIGH (ref 24.2–37.5)
APTT: 54.9 seconds — ABNORMAL HIGH (ref 24.2–37.5)
APTT: 110.9 seconds — ABNORMAL HIGH (ref 24.2–37.5)

## 2021-05-02 LAB — POC BLOOD GLUCOSE (RESULTS)
GLUCOSE, POC: 169 mg/dl — ABNORMAL HIGH (ref 70–105)
GLUCOSE, POC: 180 mg/dl — ABNORMAL HIGH (ref 70–105)
GLUCOSE, POC: 180 mg/dl — ABNORMAL HIGH (ref 70–105)
GLUCOSE, POC: 181 mg/dl — ABNORMAL HIGH (ref 70–105)

## 2021-05-02 LAB — MAGNESIUM: MAGNESIUM: 1.9 mg/dL (ref 1.8–2.6)

## 2021-05-02 LAB — PHOSPHORUS: PHOSPHORUS: 2.5 mg/dL (ref 2.3–4.0)

## 2021-05-02 MED ORDER — WATER FOR INJECTION, STERILE INTRAVENOUS SOLUTION
INTRAVENOUS | Status: AC
Start: 2021-05-02 — End: 2021-05-03
  Filled 2021-05-02: qty 500

## 2021-05-02 MED ORDER — FLUCONAZOLE 200 MG TABLET
400.0000 mg | ORAL_TABLET | Freq: Every day | ORAL | Status: AC
Start: 2021-05-03 — End: 2021-05-09
  Administered 2021-05-03 – 2021-05-04 (×2): 400 mg via ORAL
  Administered 2021-05-05 – 2021-05-06 (×2): 0 mg via ORAL
  Administered 2021-05-07 – 2021-05-09 (×3): 400 mg via ORAL
  Filled 2021-05-02 (×7): qty 2

## 2021-05-02 NOTE — Nurses Notes (Signed)
Patient up to chair, family at bedside. Morning assessment completed. Drains emptied, tan, foul smelling output. Aptt therapeutic, heparin drip maintained at current rate. No s/s of bleeding. Plan of care reviewed. Patient verbalized understanding.

## 2021-05-02 NOTE — Progress Notes (Signed)
[name]  Surgical Oncology  Progress Note      [name], [name], 76 y.o. male  Date of Birth:  12/28/1945  Date of Admission:  04/03/2021  Date of service: 05/02/2021    Assessment:  This is a 76 y.o. 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 He underwent massive transfusion protocol and ex lap on 04/03/21 at an outside facility with ligation of a bleeding vessel in the liver bed. Patient was subsequently transferred to [name] postoperaively, intubated. Patient was subsequently extubated later that day on 3/4. 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      Plan/Recommendations:  - I/D consult for fungemia   - Blood cultures 04/26/21 NGTD   - ECHO no evidence of endocarditis    - Continue fluconazole, daptomycin, and meropenem  - Diet: Continue clears   Calorie count finished   PICC line in place   Continue TPN today   JP drain with chylous output   - Peripancreatic fluid collections   - Continue octreotide   - GI scope on 3/23 unable to stent PD duct, patient with pancreatic divisim              - IR consulted; RUQ drain placement and empiric embolization 3/10              - Blood cultures 3/9: + pseudomonas    - Cultures form R JP: Pseudomonas and enterococcus              - Repeat Blood cultures 3/12 negative   - ID consulted; continue Daptomycin and meropenum (stop date 4/7)  - PE   - s/p IVC filter placement 3/17   - Aggressive pulm toilet, supp off O2   - Continue low dose heparin  - IVF: Plasmalyte 50 ml/hr  - Midline fistula with ostomy bag to monitor output  - OOB TID  - PT/OT ordered  - Dispo Continue floor status    Subjective:   Patient states he is doing better today. Reports less nausea and is keeping clear down. Denies fevers and chills.    Objective  Filed Vitals:    05/01/21 1548 05/01/21 2046  05/02/21 0047 05/02/21 0411   BP: (!) 137/90 (!) 148/86 127/79 (!) 145/82   Pulse: 92 83 84 86   Resp: '18 18 18 18   '$ Temp: 37.3 C (99.1 F) 37 C (98.6 F) 37.1 C (98.8 F) 37.1 C (98.8 F)   SpO2: 95% 96% 96% 93%     Physical Exam:   GEN:  AOx4, resting in bed, no acute distress  PULM: Normal respiratory effort.   CV:  Tachycardic, regular rhythm   ABD:   Abdomen soft, minimal ttp RUQ, nondistended. JP in place in RUQ,  Lower midline incision with feculent drainage present with ostomy bag.  Pigtail catheter present with chylous output.  MS: Atraumatic. Moves all extremities.  NEURO:   Alert and oriented to person, place and time.  Cranial nerves grossly intact.    Integumentary:  Pink, warm, and dry  PSYCHOSOCIAL: Pleasant.  Normal affect.     Labs  Results for orders placed or performed during the hospital encounter of 04/03/21 (from the past 24 hour(s))   PTT (PARTIAL THROMBOPLASTIN TIME)   Result Value Ref Range    APTT 69.4 (  H) 24.2 - 37.5 seconds    Narrative    Therapeutic range for unfractionated heparin is 60-100 seconds.   PTT (PARTIAL THROMBOPLASTIN TIME) - ONCE   Result Value Ref Range    APTT 66.6 (H) 24.2 - 37.5 seconds    Narrative    Therapeutic range for unfractionated heparin is 60-100 seconds.   PTT (PARTIAL THROMBOPLASTIN TIME)   Result Value Ref Range    APTT 69.7 (H) 24.2 - 37.5 seconds    Narrative    Therapeutic range for unfractionated heparin is 60-100 seconds.   BASIC METABOLIC PANEL   Result Value Ref Range    SODIUM 136 136 - 145 mmol/L    POTASSIUM 3.9 3.5 - 5.1 mmol/L    CHLORIDE 100 96 - 111 mmol/L    CO2 TOTAL 29 23 - 31 mmol/L    ANION GAP 7 4 - 13 mmol/L    CALCIUM 8.3 (L) 8.8 - 10.2 mg/dL    GLUCOSE 141 (H) 65 - 125 mg/dL    BUN 10 8 - 25 mg/dL    CREATININE 0.71 (L) 0.75 - 1.35 mg/dL    BUN/CREA RATIO 14 6 - 22    ESTIMATED GFR >90 >=60 mL/min/BSA   CBC   Result Value Ref Range    WBC 9.0 3.7 - 11.0 x10^3/uL    RBC 2.64 (L) 4.50 - 6.10 x10^6/uL    HGB 8.1 (L) 13.4 - 17.5 g/dL     HCT 25.2 (L) 38.9 - 52.0 %    MCV 95.5 78.0 - 100.0 fL    MCH 30.7 26.0 - 32.0 pg    MCHC 32.1 31.0 - 35.5 g/dL    RDW-CV 17.7 (H) 11.5 - 15.5 %    PLATELETS 395 150 - 400 x10^3/uL    MPV 11.4 8.7 - 12.5 fL   MAGNESIUM   Result Value Ref Range    MAGNESIUM 1.9 1.8 - 2.6 mg/dL   PHOSPHORUS   Result Value Ref Range    PHOSPHORUS 2.5 2.3 - 4.0 mg/dL   POC BLOOD GLUCOSE (RESULTS)   Result Value Ref Range    GLUCOSE, POC 216 (H) 70 - 105 mg/dl   POC BLOOD GLUCOSE (RESULTS)   Result Value Ref Range    GLUCOSE, POC 129 (H) 70 - 105 mg/dl   POC BLOOD GLUCOSE (RESULTS)   Result Value Ref Range    GLUCOSE, POC 195 (H) 70 - 105 mg/dl   POC BLOOD GLUCOSE (RESULTS)   Result Value Ref Range    GLUCOSE, POC 169 (H) 70 - 105 mg/dl       I/O:  Date 05/01/21 0700 - 05/02/21 0659 05/02/21 0700 - 05/03/21 0659   Shift 0700-1459 1500-2259 2300-0659 24 Hour Total 0700-1459 1500-2259 2300-0659 24 Hour Total   INTAKE   P.O. 120 75 180 375         Oral 120 75 180 375       I.V.(mL/kg/hr) 2010.87(3.16) 1254.06(1.97)  8882.80(0.34)         Heparin Volume 1100.37 46.06  1146.43         Volume Infused (electrolyte-A (PLASMALYTE-A) premix infusion) 505.84 163.33  669.17         Volume Infused (adult custom parenteral nutrition) 404.66 1044.67  1449.33       IV Piggyback 300   300         Volume (DAPTOmycin (CUBICIN) 800 mg in NS 50 mL IVPB) 100   100  Volume (fluconazole (DIFLUCAN) 400 mg in NS 200 mL premix IVPB) 200   200       Shift Total(mL/kg) 5366.44(03.47) 4259.56(38.75) 180(2.26) 6433.29(51.88)       OUTPUT   Urine(mL/kg/hr) 600(0.94) 990(1.56) 750(1.18) 2340(1.23)         Urine (Voided) [phone]         Urine Occurrence 2 x   2 x       Drains 0   0         JP Drain Output ([place] Drain Right;Upper Abdomen) 0   0         Drain Output (Drain (Miscellaneous) #18F pigtail catheter for RLQ fluid collection Lower;Right;Anterior Abdomen) 0   0         Drain Output (Drain (Miscellaneous) peds ostomy bag Lower;Medial  Abdomen) 0   0       Shift Total(mL/kg) 600(7.55) 416(60.63) 750(9.43) 0160(10.93)       Weight (kg) 79.5 79.5 79.5 79.5 79.5 79.5 79.5 79.5       Radiology       Current Medications:  Current Facility-Administered Medications   Medication Dose Route Frequency    acetaminophen (TYLENOL) tablet  500 mg Oral Q4H    adult custom parenteral nutrition   Intravenous Continuous    atorvastatin (LIPITOR) tablet  40 mg Oral Daily with Breakfast    benzonatate (TESSALON) capsule  100 mg Oral Q8H PRN    D5W 250 mL flush bag   Intravenous Q15 Min PRN    DAPTOmycin (CUBICIN) 800 mg in NS 50 mL IVPB  10 mg/kg (Adjusted) Intravenous Q24H    [Held by provider] docusate sodium (COLACE) capsule  100 mg Oral 2x/day    electrolyte-A (PLASMALYTE-A) premix infusion   Intravenous Continuous    elvitegravir-cobicistat-emtricitrabine-tenofovir (GENVOYA) 150-150-200-10 mg per tablet  1 Tablet Oral Daily    fluconazole (DIFLUCAN) 400 mg in NS 200 mL premix IVPB  400 mg Intravenous Q24H    gabapentin (NEURONTIN) capsule  300 mg Oral 3x/day    heparin 25,000 units in NS 250 mL infusion  18 Units/kg/hr (Adjusted) Intravenous Continuous    meropenem (MERREM) 1 g in NS 100 mL IVPB  1 g Intravenous Q8H    methocarbamol (ROBAXIN) tablet  500 mg Oral 4x/day    multivitamin-minerals-folic acid-lycopene-lutein (CERTAVITE SENIOR) tablet  1 Tablet Oral Q24H    NS 250 mL flush bag   Intravenous Q15 Min PRN    NS flush syringe  2-6 mL Intracatheter Q8HRS    NS flush syringe  2-6 mL Intracatheter Q1 MIN PRN    NS flush syringe  10-30 mL Intracatheter Q8HRS    NS flush syringe  20-30 mL Intracatheter Q1 MIN PRN    NS flush syringe  10-30 mL Intracatheter Q8HRS    NS flush syringe  20-30 mL Intracatheter Q1 MIN PRN    NS flush syringe  10-30 mL Intracatheter Q8HRS    NS flush syringe  20-30 mL Intracatheter Q1 MIN PRN    octreotide (SANDOSTATIN) 100 mcg/mL injection  100 mcg Intravenous 3x/day    ondansetron (ZOFRAN) 2 mg/mL injection  4 mg Intravenous Q6H  PRN    oxyCODONE (ROXICODONE) immediate release tablet  2.5 mg Oral Q4H PRN    oxyCODONE (ROXICODONE) immediate release tablet  5 mg Oral Q4H PRN    pantoprazole (PROTONIX) delayed release tablet  40 mg Oral Q12H    prochlorperazine (COMPAZINE) 5 mg/mL injection  10 mg Intravenous Q6H  PRN    [Held by provider] sennosides-docusate sodium (SENOKOT-S) 8.6-'50mg'$  per tablet  1 Tablet Oral 2x/day    SSIP insulin R human (HUMULIN R) 100 units/mL injection  0-12 Units Subcutaneous 4x/day PRN       Micheline Chapman, MD  Department of Family Medicine, PGY-1  Pager: [phone]    I saw and examined this patient with the resident above.  Please see the resident's note, which I have carefully reviewed, for full details. I agree with the findings and plan of care as documented in the resident's note.  Any additions/exceptions are edited/noted.    No changes.  Continue current management.  Considerations for disposition per Dr. Cyndi Bender this week.    Illene Silver, MD 05/02/2021 10:35  Associate Professor of Surgery  Division of [facility]

## 2021-05-03 LAB — CBC WITH DIFF
HCT: 25.6 % — ABNORMAL LOW (ref 38.9–52.0)
HGB: 7.8 g/dL — ABNORMAL LOW (ref 13.4–17.5)
MCH: 27.8 pg (ref 26.0–32.0)
MCHC: 30.5 g/dL — ABNORMAL LOW (ref 31.0–35.5)
MCV: 91.1 fL (ref 78.0–100.0)
MPV: 12.2 fL (ref 8.7–12.5)
PLATELETS: 454 10*3/uL — ABNORMAL HIGH (ref 150–400)
RBC: 2.81 10*6/uL — ABNORMAL LOW (ref 4.50–6.10)
RDW-CV: 17.4 % — ABNORMAL HIGH (ref 11.5–15.5)
WBC: 13.5 10*3/uL — ABNORMAL HIGH (ref 3.7–11.0)

## 2021-05-03 LAB — MANUAL DIFF AND MORPHOLOGY-SYSMEX
BASOPHIL #: 0.14 10*3/uL (ref <=0.20)
BASOPHIL %: 1 %
EOSINOPHIL #: 0.14 10*3/uL (ref <=0.50)
EOSINOPHIL %: 1 %
LYMPHOCYTE #: 7.29 10*3/uL — ABNORMAL HIGH (ref 1.00–4.80)
LYMPHOCYTE %: 54 %
METAMYELOCYTE %: 1 %
MONOCYTE #: 0.81 10*3/uL (ref 0.20–1.10)
MONOCYTE %: 6 %
MYELOCYTE %: 1 %
NEUTROPHIL #: 4.86 10*3/uL (ref 1.50–7.70)
NEUTROPHIL %: 36 %
RBC MORPHOLOGY: NORMAL

## 2021-05-03 LAB — BASIC METABOLIC PANEL
ANION GAP: 9 mmol/L (ref 4–13)
BUN/CREA RATIO: 15 (ref 6–22)
BUN: 10 mg/dL (ref 8–25)
CALCIUM: 8.1 mg/dL — ABNORMAL LOW (ref 8.8–10.2)
CHLORIDE: 98 mmol/L (ref 96–111)
CO2 TOTAL: 28 mmol/L (ref 23–31)
CREATININE: 0.66 mg/dL — ABNORMAL LOW (ref 0.75–1.35)
ESTIMATED GFR: >90 mL/min/BSA (ref >=60)
GLUCOSE: 145 mg/dL — ABNORMAL HIGH (ref 65–125)
POTASSIUM: 4.1 mmol/L (ref 3.5–5.1)
SODIUM: 135 mmol/L — ABNORMAL LOW (ref 136–145)

## 2021-05-03 LAB — POC BLOOD GLUCOSE (RESULTS)
GLUCOSE, POC: 163 mg/dl — ABNORMAL HIGH (ref 70–105)
GLUCOSE, POC: 146 mg/dl — ABNORMAL HIGH (ref 70–105)
GLUCOSE, POC: 162 mg/dl — ABNORMAL HIGH (ref 70–105)
GLUCOSE, POC: 171 mg/dl — ABNORMAL HIGH (ref 70–105)
GLUCOSE, POC: 134 mg/dl — ABNORMAL HIGH (ref 70–105)

## 2021-05-03 LAB — C-REACTIVE PROTEIN(CRP),INFLAMMATION: CRP INFLAMMATION: 88.2 mg/L — ABNORMAL HIGH (ref <8.0)

## 2021-05-03 LAB — HEPATIC FUNCTION PANEL
ALBUMIN: 1.7 g/dL — ABNORMAL LOW (ref 3.4–4.8)
ALKALINE PHOSPHATASE: 94 U/L (ref 45–115)
ALT (SGPT): 12 U/L (ref 10–55)
AST (SGOT): 17 U/L (ref 8–45)
BILIRUBIN DIRECT: 0.1 mg/dL (ref 0.1–0.4)
BILIRUBIN TOTAL: 0.2 mg/dL — ABNORMAL LOW (ref 0.3–1.3)
PROTEIN TOTAL: 5.8 g/dL — ABNORMAL LOW (ref 6.0–8.0)

## 2021-05-03 LAB — TRIGLYCERIDE: TRIGLYCERIDES: 165 mg/dL — ABNORMAL HIGH (ref <150)

## 2021-05-03 LAB — PTT (PARTIAL THROMBOPLASTIN TIME)
APTT: 82.4 seconds — ABNORMAL HIGH (ref 24.2–37.5)
APTT: 49.8 seconds — ABNORMAL HIGH (ref 24.2–37.5)
APTT: 58.8 seconds — ABNORMAL HIGH (ref 24.2–37.5)

## 2021-05-03 LAB — MAGNESIUM: MAGNESIUM: 1.9 mg/dL (ref 1.8–2.6)

## 2021-05-03 LAB — PHOSPHORUS: PHOSPHORUS: 2.2 mg/dL — ABNORMAL LOW (ref 2.3–4.0)

## 2021-05-03 LAB — CREATINE KINASE (CK), TOTAL, SERUM: CREATINE KINASE: 11 U/L — ABNORMAL LOW (ref 45–225)

## 2021-05-03 MED ORDER — SODIUM CHLORIDE 0.9 % INTRAVENOUS SOLUTION
20.0000 mmol | INTRAVENOUS | Status: AC
Start: 2021-05-03 — End: 2021-05-03
  Administered 2021-05-03: 20 mmol via INTRAVENOUS
  Administered 2021-05-03: 0 mmol via INTRAVENOUS
  Filled 2021-05-03: qty 6.67

## 2021-05-03 MED ORDER — HEPARIN (PORCINE) 5,000 UNITS/ML BOLUS FOR DOSE ADJUSTMENT
50.0000 [IU]/kg | INTRAMUSCULAR | Status: AC
Start: 2021-05-03 — End: 2021-05-03
  Administered 2021-05-03: 3500 [IU] via INTRAVENOUS
  Filled 2021-05-03: qty 1

## 2021-05-03 MED ORDER — WATER FOR INJECTION, STERILE INTRAVENOUS SOLUTION
INTRAVENOUS | Status: AC
Start: 2021-05-03 — End: 2021-05-04
  Filled 2021-05-03: qty 800

## 2021-05-03 NOTE — Progress Notes (Signed)
[facility]  Surgical Oncology  Progress Note      [name], [name], 76 y.o. male  Date of Birth:  05/12/1945  Date of Admission:  04/03/2021  Date of service: 05/03/2021    Assessment:  This is a 76 y.o. 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 He underwent massive transfusion protocol and ex lap on 04/03/21 at an outside facility with ligation of a bleeding vessel in the liver bed. Patient was subsequently transferred to [facility] postoperaively, intubated. Patient was subsequently extubated later that day on 3/4. 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      Plan/Recommendations:  - I/D consult for fungemia   - Blood cultures 04/26/21 NGTD   - ECHO no evidence of endocarditis    - Continue fluconazole, daptomycin, and meropenem  - Diet: Continue clears as tolerated   Calorie count finished   PICC line in place   Continue TPN today  - Peripancreatic fluid collections   - Continue octreotide   - GI scope on 3/23 unable to stent PD duct, patient with pancreatic divisim              - IR consulted; RUQ drain placement and empiric embolization 3/10              - Blood cultures 3/9: + pseudomonas    - Cultures form R JP: Pseudomonas and enterococcus              - Repeat Blood cultures 3/12 negative   - ID consulted; continue Daptomycin and meropenum (stop date 4/7)  - PE   - s/p IVC filter placement 3/17   - Aggressive pulm toilet, supp off O2   - Continue heparin gtt  - IVF: Plasmalyte 50 ml/hr  - 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 Pending clinical course. Appreciate care management continued evaluation and assistance.      Subjective:    Pt seen in NAD, resting in bed. He is eager to work with PT/OT today. States that he ambulated some in his room yesterday. No acute concerns.    Objective  Filed Vitals:    05/02/21 2006 05/03/21 0007 05/03/21 0412 05/03/21 0634   BP: 126/74 121/77 136/73 134/85   Pulse: 92 83 90 92   Resp: '18 16 18 16   '$ Temp: 37.2 C (99 F) 37.1 C (98.8 F) 37.5 C (99.5 F) 37.5 C (99.5 F)   SpO2: 91% 92% 91% 91%     Physical Exam:   GEN:  AOx4, resting in bed, no acute distress  PULM: Normal respiratory effort. Equal/symmetric chest rise.  CV:  Pink, well perfused, RRR  ABD:   Abdomen soft, nontender, nondistended. Lower midline incision with feculent drainage present with ostomy pouch on top.  Pigtail catheter present with tan murky output.  MS: Atraumatic. Moves all extremities.  NEURO:   Alert and oriented to person, place and time.  Cranial nerves grossly intact.    Integumentary:  Pink, warm, and dry  PSYCHOSOCIAL: Pleasant.  Normal affect.     Labs  Results for orders placed or performed during the hospital encounter of  04/03/21 (from the past 24 hour(s))   PTT (PARTIAL THROMBOPLASTIN TIME)   Result Value Ref Range    APTT 69.6 (H) 24.2 - 37.5 seconds    Narrative    Therapeutic range for unfractionated heparin is 60-100 seconds.   PTT (PARTIAL THROMBOPLASTIN TIME)   Result Value Ref Range    APTT 54.9 (H) 24.2 - 37.5 seconds    Narrative    Therapeutic range for unfractionated heparin is 60-100 seconds.   PTT (PARTIAL THROMBOPLASTIN TIME) - ONCE   Result Value Ref Range    APTT 110.9 (H) 24.2 - 37.5 seconds    Narrative    Therapeutic range for unfractionated heparin is 60-100 seconds.   CREATINE KINASE (CK), TOTAL, SERUM   Result Value Ref Range    CREATINE KINASE 11 (L) 45 - 225 U/L   HEPATIC FUNCTION PANEL   Result Value Ref Range    ALBUMIN 1.7 (L) 3.4 - 4.8 g/dL     ALKALINE PHOSPHATASE 94 45 - 115 U/L    ALT (SGPT) 12 10 - 55 U/L    AST (SGOT)  17 8 - 45 U/L    BILIRUBIN TOTAL 0.2 (L) 0.3 - 1.3 mg/dL     BILIRUBIN DIRECT 0.1 0.1 - 0.4 mg/dL    PROTEIN TOTAL 5.8 (L) 6.0 - 8.0 g/dL   C-REACTIVE PROTEIN(CRP),INFLAMMATION   Result Value Ref Range    CRP INFLAMMATION 88.2 (H) <8.0 mg/L   CBC/DIFF    Narrative    The following orders were created for panel order CBC/DIFF.  Procedure                               Abnormality         Status                     ---------                               -----------         ------                     CBC WITH DPOE[[id number]]                Abnormal            Preliminary result           Please view results for these tests on the individual orders.   BASIC METABOLIC PANEL   Result Value Ref Range    SODIUM 135 (L) 136 - 145 mmol/L    POTASSIUM 4.1 3.5 - 5.1 mmol/L    CHLORIDE 98 96 - 111 mmol/L    CO2 TOTAL 28 23 - 31 mmol/L    ANION GAP 9 4 - 13 mmol/L    CALCIUM 8.1 (L) 8.8 - 10.2 mg/dL    GLUCOSE 145 (H) 65 - 125 mg/dL    BUN 10 8 - 25 mg/dL    CREATININE 0.66 (L) 0.75 - 1.35 mg/dL    BUN/CREA RATIO 15 6 - 22    ESTIMATED GFR >90 >=60 mL/min/BSA   PHOSPHORUS   Result Value Ref Range    PHOSPHORUS 2.2 (L) 2.3 - 4.0 mg/dL   MAGNESIUM   Result Value Ref Range    MAGNESIUM 1.9 1.8 - 2.6 mg/dL   PTT (PARTIAL THROMBOPLASTIN TIME) -  ONCE   Result Value Ref Range    APTT 82.4 (H) 24.2 - 37.5 seconds    Narrative    Therapeutic range for unfractionated heparin is 60-100 seconds.   CBC WITH DIFF   Result Value Ref Range    WBC 13.5 (H) 3.7 - 11.0 x10^3/uL    RBC 2.81 (L) 4.50 - 6.10 x10^6/uL    HGB 7.8 (L) 13.4 - 17.5 g/dL    HCT 25.6 (L) 38.9 - 52.0 %    MCV 91.1 78.0 - 100.0 fL    MCH 27.8 26.0 - 32.0 pg    MCHC 30.5 (L) 31.0 - 35.5 g/dL    RDW-CV 17.4 (H) 11.5 - 15.5 %    PLATELETS 454 (H) 150 - 400 x10^3/uL    MPV 12.2 8.7 - 12.5 fL   POC BLOOD GLUCOSE (RESULTS)   Result Value Ref Range    GLUCOSE, POC 180 (H) 70 - 105 mg/dl   POC BLOOD GLUCOSE (RESULTS)   Result Value Ref Range    GLUCOSE, POC 180 (H) 70 - 105 mg/dl   POC BLOOD GLUCOSE (RESULTS)   Result Value Ref Range     GLUCOSE, POC 181 (H) 70 - 105 mg/dl   POC BLOOD GLUCOSE (RESULTS)   Result Value Ref Range    GLUCOSE, POC 163 (H) 70 - 105 mg/dl       I/O:  Date 05/02/21 0700 - 05/03/21 0659 05/03/21 0700 - 05/04/21 0659   Shift 0700-1459 1500-2259 2300-0659 24 Hour Total 0700-1459 1500-2259 2300-0659 24 Hour Total   INTAKE   P.O. 340 [phone]         Oral [phone]       I.V.(mL/kg/hr) 1629.1(2.56) 581(0.91)  2210.1         Heparin Volume 239.7 56.4  296.1         Volume Infused (electrolyte-A (PLASMALYTE-A) premix infusion) 850 200  1050         Volume Infused (adult custom parenteral nutrition) 539.4 324.6  864       Shift Total(mL/kg) 1969.1(24.77) 626(94.85) 462(7.03) 3210.1(44.71)       OUTPUT   Urine(mL/kg/hr) 800(1.26) 800(1.26) 800 2400         Urine (Voided) 800 [phone]       Drains 90 0 195 285         JP Drain Output (Jackson Pratt Drain Right;Upper Abdomen)  0 5 5         Drain Output (Drain (Miscellaneous) #31F pigtail catheter for RLQ fluid collection Lower;Right;Anterior Abdomen) 90  90 180         Drain Output (Drain (Miscellaneous) peds ostomy bag Lower;Medial Abdomen)   100 100       Shift Total(mL/kg) [id number]) 800(10.06) 500(93.81) 8299(37.1)       Weight (kg) 79.5 79.5 71.8 71.8 71.8 71.8 71.8 71.8       Radiology       Current Medications:  Current Facility-Administered Medications   Medication Dose Route Frequency   . acetaminophen (TYLENOL) tablet  500 mg Oral Q4H   . adult custom parenteral nutrition   Intravenous Continuous   . atorvastatin (LIPITOR) tablet  40 mg Oral Daily with Breakfast   . benzonatate (TESSALON) capsule  100 mg Oral Q8H PRN   . D5W 250 mL flush bag   Intravenous Q15 Min PRN   . DAPTOmycin (CUBICIN) 800 mg in NS 50 mL IVPB  10 mg/kg (Adjusted) Intravenous Q24H   . [  Held by provider] docusate sodium (COLACE) capsule  100 mg Oral 2x/day   . electrolyte-A (PLASMALYTE-A) premix infusion   Intravenous Continuous   . elvitegravir-cobicistat-emtricitrabine-tenofovir  (GENVOYA) 150-150-200-10 mg per tablet  1 Tablet Oral Daily   . fluconazole (DIFLUCAN) tablet  400 mg Oral Daily   . gabapentin (NEURONTIN) capsule  300 mg Oral 3x/day   . heparin 25,000 units in NS 250 mL infusion  18 Units/kg/hr (Adjusted) Intravenous Continuous   . meropenem (MERREM) 1 g in NS 100 mL IVPB  1 g Intravenous Q8H   . methocarbamol (ROBAXIN) tablet  500 mg Oral 4x/day   . multivitamin-minerals-folic acid-lycopene-lutein (CERTAVITE SENIOR) tablet  1 Tablet Oral Q24H   . NS 250 mL flush bag   Intravenous Q15 Min PRN   . NS flush syringe  2-6 mL Intracatheter Q8HRS   . NS flush syringe  2-6 mL Intracatheter Q1 MIN PRN   . NS flush syringe  10-30 mL Intracatheter Q8HRS   . NS flush syringe  20-30 mL Intracatheter Q1 MIN PRN   . NS flush syringe  10-30 mL Intracatheter Q8HRS   . NS flush syringe  20-30 mL Intracatheter Q1 MIN PRN   . NS flush syringe  10-30 mL Intracatheter Q8HRS   . NS flush syringe  20-30 mL Intracatheter Q1 MIN PRN   . octreotide (SANDOSTATIN) 100 mcg/mL injection  100 mcg Intravenous 3x/day   . ondansetron (ZOFRAN) 2 mg/mL injection  4 mg Intravenous Q6H PRN   . oxyCODONE (ROXICODONE) immediate release tablet  2.5 mg Oral Q4H PRN   . oxyCODONE (ROXICODONE) immediate release tablet  5 mg Oral Q4H PRN   . pantoprazole (PROTONIX) delayed release tablet  40 mg Oral Q12H   . prochlorperazine (COMPAZINE) 5 mg/mL injection  10 mg Intravenous Q6H PRN   . [Held by provider] sennosides-docusate sodium (SENOKOT-S) 8.6-'50mg'$  per tablet  1 Tablet Oral 2x/day   . SSIP insulin R human (HUMULIN R) 100 units/mL injection  0-12 Units Subcutaneous 4x/day PRN         Donna Christen, MD  General Surgery // PGY-1  [facility]

## 2021-05-03 NOTE — Care Management Notes (Addendum)
[facility]  Care Management Note    Patient Name: [name]  Date of Birth: 11/14/1945  Sex: male  Date/Time of Admission: 04/03/2021  2:01 PM  Room/Bed: 924/A  Payor: MEDICARE / Plan: MEDICARE PART A AND B / Product Type: Medicare /    LOS: 30 days   Primary Care Providers:  [place] (General)    Admitting Diagnosis:  Intra abdominal hemorrhage [R58]    Assessment:      05/03/21 1530   Assessment Details   Assessment Type Continued Assessment   Date of Care Management Update 05/03/21   Date of Next DCP Update 05/06/21   Care Management Plan   Discharge Planning Status plan in progress   Projected Discharge Date 05/07/21   Discharge Needs Assessment   Discharge Facility/Level of Care Needs SNF vs Rehab     Spoke with service, no plans to discharge Pt for the next few days. Pt will most likely require TPN post discharge. MSW contacted Pt's spouse [name] [phone]  to discuss next steps, no answer. MSW will continue to follow.    Discharge Plan:  SNF vs Rehab       The patient will continue to be evaluated for developing discharge needs.     Case Manager: Elizabeth Palau, [place]  Phone: [phone]

## 2021-05-03 NOTE — Care Plan (Signed)
Medical Nutrition Therapy Calorie Count Note  I: Estimated Needs:  Energy Calorie Requirements: 2761-4709 cal (28-30 cal/75.9 kg)per day   Protein Requirements (gms/day): 88-110 g prot (1.2-1.5 g/73.7 kg)per day    Calorie Count day1:2090calories, 99grams of protein, which met 91-99% of estimated calorie needs and 90-113% of estimated protein needs. Pt consumed 4 ensure and 2 magic cups which provides 1980 calories.    Calorie Count day2:348calories, 12grams of protein, which met 15-17% of estimated calorie needs and 11-14 % of estimated protein needs. Per CC sheet pt did not have dinner documented. Dinner was state by pt's family. Pt did not have any ensure per family statement.    Calorie Count day3:346calories, 0grams of protein, which met 15-16% of estimated calorie needs and 11-14 % of estimated protein needs. Per CC sheet pt did not have dinner documented.    Calorie Count day4:245calories, 1grams of protein, which met 11-12% of estimated calorie needs and 11-14 % of estimated protein needs. Per CC sheet pt did not have dinner documented.     Calorie Count daily average of 4 days:757calories, 28grams of protein, which met 33-36% of estimated calorie needs and 25-32 % of estimated protein needs.     Tracey Harries, NDTR  05/03/2021, 09:57  Pager#[phone]

## 2021-05-03 NOTE — Care Plan (Signed)
Medical Nutrition Therapy Follow Up        SUBJECTIVE : pt was tolerated on a calorie count last week when on solid food, however pt put on a CL diet Friday so calorie results during that time ranged from 245-348 cals and dinner was never documented.     OBJECTIVE:     Current Diet Order/Nutrition Support: CL diet plus TPN: 1000 cal, 50 g prot, 500 dextrose cal, 300 lipids at 22m/hr       Height Used for Calculations: 175.3 cm (5' 9.02")  Weight Used For Calculations: 75.9 kg (167 lb 5.3 oz) (standing weight on 3/3)  Weight trends: 3/14- 84.4 kg (bed) ; 3/23- 84.4 kg (no edema noted on flow sheet); 3/24- 79.5 kg ;4/3- 71.8 kg (bed)   BMI (kg/m2): 24.75  BMI Assessment: BMI 18.5-24.9: normal  Ideal Body Weight (IBW) (kg): 73.73  % Ideal Body Weight: 102.94     Usual Body Weight: 81.3 kg (179 lb 3.7 oz)  Weight Loss:  (intentional weight loss)      Estimated Needs:    Energy Calorie Requirements: 2100-2300 cal (28-30 cal/75.9 kg)  Protein Requirements (gms/day): 88-110 g prot (1.2-1.5 g/73.7 kg)   Fluid Requirements: 2100-2300 ml (28-30 ml/75.9 k)       Comments: 76y.o. 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 He underwent massive transfusion protocol and ex lap on 04/03/21 at an outside facility with ligation of a bleeding vessel in the liver bed. [facility]. Patient was subsequently extubated later that day on 3/4. 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      Latest Reference Range & Units 05/03/21 05:24   TRIGLYCERIDES <150 mg/dL 165 (H)   (H): Data is abnormally high      Plan/Interventions :   To continue CL diet per primary team  Will discontinue calorie count    Continue oral multivitamin    Plan was to  provide supplemental TPN meeting 50% of his needs which is  what ordered but since pt now on clears will starting advancing TPN to goal.  Will increase to 1400 cal, 80 g prot, 660 dextrose cal, 420 lipids at ~ 55 ml/hr today.     Monitor potassium, magnesium and phosphorus daily along with blood sugars and BMP and will advance TPN to goal as able (slowly by 200-400 calories at a time if signs of refeeding)       Goal  TPN   Rate: ~79 ml/hr  2200 kcal /day = 29 kcal/kg  110 grams Protein = 1.5 grams/ kg  1100 Dextrose kcals, GIR 3  660 IL kcals or 0.9 grams Fat/kg       Monitor LFTs and triglycerides weekly while on TPN  Monitor weekly weights.   Will continue to follow.     Nutrition Diagnosis: Altered GI function related to midline fistula draining stool as evidenced by need for TPN: in progress     DDierdre Searles RD, LD, CDeer [place]4/03/2021, 11:37  Pager #[phone]

## 2021-05-03 NOTE — Care Plan (Signed)
Problem: Adult Inpatient Plan of Care  Goal: Plan of Care Review  Outcome: Ongoing (see interventions/notes)  Goal: Patient-Specific Goal (Individualized)  Outcome: Ongoing (see interventions/notes)  Goal: Absence of Hospital-Acquired Illness or Injury  Outcome: Ongoing (see interventions/notes)  Goal: Optimal Comfort and Wellbeing  Outcome: Ongoing (see interventions/notes)  Goal: Rounds/Family Conference  Outcome: Ongoing (see interventions/notes)     Problem: Fall Injury Risk  Goal: Absence of Fall and Fall-Related Injury  Outcome: Ongoing (see interventions/notes)     Problem: Skin Injury Risk Increased  Goal: Skin Health and Integrity  Outcome: Ongoing (see interventions/notes)     Problem: Wound Healing Progression  Goal: Optimal Wound Healing  Outcome: Ongoing (see interventions/notes)     Problem: Acute Rehab Services Goal & Intervention Plan  Goal: Bathing Goal  Description: Stand Alone Therapy Goal  Outcome: Ongoing (see interventions/notes)  Goal: Bed Mobility Goal  Description: Stand Alone Therapy Goal  Outcome: Ongoing (see interventions/notes)  Goal: Caregiver Training Goal  Description: Stand Alone Therapy Goal  Outcome: Ongoing (see interventions/notes)  Goal: Cognition Goal  Description: Stand Alone Therapy Goal  Outcome: Ongoing (see interventions/notes)  Goal: Cognition Goals, SLP  Description: Stand Alone Therapy Goal  Outcome: Ongoing (see interventions/notes)  Goal: Communication Goals, SLP  Description: Stand Alone Therapy Goal  Outcome: Ongoing (see interventions/notes)  Goal: Dysphagia Goals, SLP  Description: Stand Alone Therapy Goal  Outcome: Ongoing (see interventions/notes)  Goal: Eating Self-Feeding Goal  Description: Stand Alone Therapy Goal  Outcome: Ongoing (see interventions/notes)  Goal: Gait Training Goal  Description: Stand Alone Therapy Goal  Outcome: Ongoing (see interventions/notes)  Goal: Grooming Goal  Description: Stand Alone Therapy Goal  Outcome: Ongoing (see  interventions/notes)  Goal: Home Management Goal  Description: Stand Alone Therapy Goal  Outcome: Ongoing (see interventions/notes)  Goal: Interprofessional Goal  Description: Stand Alone Therapy Goal  Outcome: Ongoing (see interventions/notes)  Goal: LB Dressing Goal  Description: Stand Alone Therapy Goal  Outcome: Ongoing (see interventions/notes)  Goal: Occupational Therapy Goals  Description: Stand Alone Therapy Goal  Outcome: Ongoing (see interventions/notes)  Goal: Physical Therapy Goal  Description: Stand Alone Therapy Goal  Outcome: Ongoing (see interventions/notes)  Goal: Range of Motion Goal  Description: Stand Alone Therapy Goal  Outcome: Ongoing (see interventions/notes)  Goal: Strength Goal  Description: Stand Alone Therapy Goal  Outcome: Ongoing (see interventions/notes)  Goal: Toileting Goal  Description: Stand Alone Therapy Goal  Outcome: Ongoing (see interventions/notes)  Goal: Goal Transfer Training  Description: Stand Alone Therapy Goal  Outcome: Ongoing (see interventions/notes)  Goal: UB Dressing Goal  Description: Stand Alone Therapy Goal  Outcome: Ongoing (see interventions/notes)

## 2021-05-04 DIAGNOSIS — Z5181 Encounter for therapeutic drug level monitoring: Secondary | ICD-10-CM

## 2021-05-04 LAB — ECG 12-LEAD
Atrial Rate: 93 {beats}/min
Calculated P Axis: 38 degrees
Calculated R Axis: 22 degrees
Calculated T Axis: 46 degrees
PR Interval: 134 ms
QRS Duration: 78 ms
QT Interval: 382 ms
QTC Calculation: 474 ms
Ventricular rate: 93 {beats}/min

## 2021-05-04 LAB — CBC
HCT: 23.9 % — ABNORMAL LOW (ref 38.9–52.0)
HGB: 7.8 g/dL — ABNORMAL LOW (ref 13.4–17.5)
MCH: 31.2 pg (ref 26.0–32.0)
MCHC: 32.6 g/dL (ref 31.0–35.5)
MCV: 95.6 fL (ref 78.0–100.0)
MPV: 11.5 fL (ref 8.7–12.5)
PLATELETS: 405 10*3/uL — ABNORMAL HIGH (ref 150–400)
RBC: 2.5 10*6/uL — ABNORMAL LOW (ref 4.50–6.10)
RDW-CV: 18.3 % — ABNORMAL HIGH (ref 11.5–15.5)
WBC: 12.3 10*3/uL — ABNORMAL HIGH (ref 3.7–11.0)

## 2021-05-04 LAB — PHOSPHORUS: PHOSPHORUS: 2.8 mg/dL (ref 2.3–4.0)

## 2021-05-04 LAB — BASIC METABOLIC PANEL
ANION GAP: 4 mmol/L (ref 4–13)
BUN/CREA RATIO: 17 (ref 6–22)
BUN: 12 mg/dL (ref 8–25)
CALCIUM: 8.3 mg/dL — ABNORMAL LOW (ref 8.8–10.2)
CHLORIDE: 100 mmol/L (ref 96–111)
CO2 TOTAL: 30 mmol/L (ref 23–31)
CREATININE: 0.72 mg/dL — ABNORMAL LOW (ref 0.75–1.35)
ESTIMATED GFR: >90 mL/min/BSA (ref >=60)
GLUCOSE: 128 mg/dL — ABNORMAL HIGH (ref 65–125)
POTASSIUM: 4.1 mmol/L (ref 3.5–5.1)
SODIUM: 134 mmol/L — ABNORMAL LOW (ref 136–145)

## 2021-05-04 LAB — PTT (PARTIAL THROMBOPLASTIN TIME)
APTT: 65.5 seconds — ABNORMAL HIGH (ref 24.2–37.5)
APTT: 53.7 seconds — ABNORMAL HIGH (ref 24.2–37.5)
APTT: 164.2 seconds (ref 24.2–37.5)

## 2021-05-04 LAB — MAGNESIUM: MAGNESIUM: 1.9 mg/dL (ref 1.8–2.6)

## 2021-05-04 LAB — POC BLOOD GLUCOSE (RESULTS)
GLUCOSE, POC: 184 mg/dl — ABNORMAL HIGH (ref 70–105)
GLUCOSE, POC: 155 mg/dl — ABNORMAL HIGH (ref 70–105)
GLUCOSE, POC: 161 mg/dl — ABNORMAL HIGH (ref 70–105)
GLUCOSE, POC: 140 mg/dl — ABNORMAL HIGH (ref 70–105)

## 2021-05-04 MED ORDER — WATER FOR INJECTION, STERILE INTRAVENOUS SOLUTION
INTRAVENOUS | Status: AC
Start: 2021-05-04 — End: 2021-05-05
  Filled 2021-05-04: qty 950

## 2021-05-04 MED ORDER — HEPARIN (PORCINE) 5,000 UNITS/ML BOLUS FOR DOSE ADJUSTMENT
25.0000 [IU]/kg | INTRAMUSCULAR | Status: AC
Start: 2021-05-04 — End: 2021-05-04
  Administered 2021-05-04: 2000 [IU] via INTRAVENOUS
  Filled 2021-05-04: qty 1

## 2021-05-04 NOTE — Progress Notes (Signed)
[facility]  Surgical Oncology  Progress Note      [name], [name], 76 y.o. male  Date of Birth:  [birth year]  Date of Admission:  04/03/2021  Date of service: 05/04/2021    Assessment:  This is a 76 y.o. 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 He underwent massive transfusion protocol and ex lap on 04/03/21 at an outside facility with ligation of a bleeding vessel in the liver bed. Patient was subsequently transferred to [facility] postoperaively, intubated. Patient was subsequently extubated later that day on 3/4. 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      Plan/Recommendations:  - I/D consult for fungemia   - Blood cultures 04/26/21 NGTD   - ECHO no evidence of endocarditis    - Continue fluconazole, daptomycin, and meropenem  - Diet: Continue clears as tolerated   PICC line in place   Continue TPN, likely advance to goal today  - Peripancreatic fluid collections   - Continue octreotide   - GI scope on 3/23 unable to stent PD duct, patient with pancreatic divisim              - IR consulted; RUQ drain placement and empiric embolization 3/10              - Blood cultures 3/9: + pseudomonas    - Cultures form R JP: Pseudomonas and enterococcus              - Repeat Blood cultures 3/12 negative   - ID consulted; continue Daptomycin and meropenum (stop date 4/7)  - PE   - s/p IVC filter placement 3/17   - Aggressive pulm toilet, supp off O2   - Continue heparin gtt  - IVF: Plasmalyte 50 ml/hr  - 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 Pending clinical course. Appreciate care management continued evaluation and assistance.      Subjective:    Pt seen in NAD, resting in bed. He is eager to work with PT/OT. States that he ambulated some in his room yesterday. No acute concerns.    Objective  Filed Vitals:    05/03/21 1612 05/03/21 1953 05/03/21 2320 05/04/21 0337   BP: (!) 144/79 124/74 138/79 130/78   Pulse: 97 84 78 73   Resp: '16 16 18 18   '$ Temp: 36.9 C (98.5 F) 37.2 C (99 F) 36.8 C (98.2 F) 36.8 C (98.2 F)   SpO2: 90% 90% 90% 95%     Physical Exam:   GEN:  AOx4, resting in bed, no acute distress  PULM: Normal respiratory effort. Equal/symmetric chest rise.  CV:  Pink, well perfused, RRR  ABD:   Abdomen soft, nontender, nondistended. Lower midline incision with feculent drainage present with ostomy pouch on top.  Pigtail catheter present with tan murky output.  MS: Atraumatic. Moves all extremities.  NEURO:   Alert and oriented to person, place and time.  Cranial nerves grossly intact.    Integumentary:  Pink, warm, and dry  PSYCHOSOCIAL: Pleasant.  Normal affect.     Labs  Results for orders placed or performed during the hospital encounter of 04/03/21 (  from the past 24 hour(s))   PTT (PARTIAL THROMBOPLASTIN TIME) - ONCE   Result Value Ref Range    APTT 49.8 (H) 24.2 - 37.5 seconds    Narrative    Therapeutic range for unfractionated heparin is 60-100 seconds.   PTT (PARTIAL THROMBOPLASTIN TIME) - ONCE   Result Value Ref Range    APTT 58.8 (H) 24.2 - 37.5 seconds    Narrative    Therapeutic range for unfractionated heparin is 60-100 seconds.   BASIC METABOLIC PANEL   Result Value Ref Range    SODIUM 134 (L) 136 - 145 mmol/L    POTASSIUM 4.1 3.5 - 5.1 mmol/L    CHLORIDE 100 96 - 111 mmol/L    CO2 TOTAL 30 23 - 31 mmol/L    ANION GAP 4 4 - 13 mmol/L    CALCIUM 8.3 (L) 8.8 - 10.2 mg/dL    GLUCOSE 128 (H) 65 - 125 mg/dL    BUN 12 8 - 25 mg/dL    CREATININE 0.72 (L) 0.75 - 1.35 mg/dL    BUN/CREA RATIO 17 6 - 22    ESTIMATED GFR >90 >=60 mL/min/BSA   PHOSPHORUS   Result Value Ref Range    PHOSPHORUS 2.8 2.3 - 4.0 mg/dL   MAGNESIUM   Result Value Ref  Range    MAGNESIUM 1.9 1.8 - 2.6 mg/dL   CBC   Result Value Ref Range    WBC 12.3 (H) 3.7 - 11.0 x10^3/uL    RBC 2.50 (L) 4.50 - 6.10 x10^6/uL    HGB 7.8 (L) 13.4 - 17.5 g/dL    HCT 23.9 (L) 38.9 - 52.0 %    MCV 95.6 78.0 - 100.0 fL    MCH 31.2 26.0 - 32.0 pg    MCHC 32.6 31.0 - 35.5 g/dL    RDW-CV 18.3 (H) 11.5 - 15.5 %    PLATELETS 405 (H) 150 - 400 x10^3/uL    MPV 11.5 8.7 - 12.5 fL   PTT (PARTIAL THROMBOPLASTIN TIME) - ONCE   Result Value Ref Range    APTT 65.5 (H) 24.2 - 37.5 seconds    Narrative    Therapeutic range for unfractionated heparin is 60-100 seconds.   POC BLOOD GLUCOSE (RESULTS)   Result Value Ref Range    GLUCOSE, POC 146 (H) 70 - 105 mg/dl   POC BLOOD GLUCOSE (RESULTS)   Result Value Ref Range    GLUCOSE, POC 162 (H) 70 - 105 mg/dl   POC BLOOD GLUCOSE (RESULTS)   Result Value Ref Range    GLUCOSE, POC 171 (H) 70 - 105 mg/dl   POC BLOOD GLUCOSE (RESULTS)   Result Value Ref Range    GLUCOSE, POC 134 (H) 70 - 105 mg/dl   POC BLOOD GLUCOSE (RESULTS)   Result Value Ref Range    GLUCOSE, POC 184 (H) 70 - 105 mg/dl       I/O:  Date 05/03/21 0700 - 05/04/21 0659 05/04/21 0700 - 05/05/21 0659   Shift 0700-1459 1500-2259 2300-0659 24 Hour Total 0700-1459 1500-2259 2300-0659 24 Hour Total   INTAKE   P.O. 75 240 480 795         Oral 75 240 480 795       I.V.(mL/kg/hr)  864(1.5)  864(0.5)         Volume Infused (adult custom parenteral nutrition)  864  864       IV Piggyback 506.72 230  736.[place]  Volume (DAPTOmycin (CUBICIN) 800 mg in NS 50 mL IVPB)  230  230         Volume (potassium phosphate 20 mmol in NS 500 mL IVPB) 506.72   506.72       Shift Total(mL/kg) 581.72(8.1) 4128(78.67) 480(6.69) 6720.94(70.96)       OUTPUT   Urine(mL/kg/hr) 640(1.11) 600(1.04) 475(0.83) 1715(1)         Urine (Voided) 640 [phone]       Drains 75 0 100 175         JP Drain Output ([place] Pratt Drain Right;Upper Abdomen)  0 0 0         Drain Output (Drain (Miscellaneous) #36F pigtail catheter for RLQ fluid collection  Lower;Right;Anterior Abdomen) 75  100 175         Drain Output (Drain (Miscellaneous) peds ostomy bag Lower;Medial Abdomen)  0 0 0       Shift Total(mL/kg) 715(9.96) 600(8.36) 575(8.01) 2836(62.94)       Weight (kg) 71.8 71.8 71.8 71.8 71.8 71.8 71.8 71.8       Radiology       Current Medications:  Current Facility-Administered Medications   Medication Dose Route Frequency   . acetaminophen (TYLENOL) tablet  500 mg Oral Q4H   . adult custom parenteral nutrition   Intravenous Continuous   . atorvastatin (LIPITOR) tablet  40 mg Oral Daily with Breakfast   . benzonatate (TESSALON) capsule  100 mg Oral Q8H PRN   . D5W 250 mL flush bag   Intravenous Q15 Min PRN   . DAPTOmycin (CUBICIN) 800 mg in NS 50 mL IVPB  10 mg/kg (Adjusted) Intravenous Q24H   . [Held by provider] docusate sodium (COLACE) capsule  100 mg Oral 2x/day   . electrolyte-A (PLASMALYTE-A) premix infusion   Intravenous Continuous   . elvitegravir-cobicistat-emtricitrabine-tenofovir (GENVOYA) 150-150-200-10 mg per tablet  1 Tablet Oral Daily   . fluconazole (DIFLUCAN) tablet  400 mg Oral Daily   . gabapentin (NEURONTIN) capsule  300 mg Oral 3x/day   . heparin 25,000 units in NS 250 mL infusion  18 Units/kg/hr (Adjusted) Intravenous Continuous   . meropenem (MERREM) 1 g in NS 100 mL IVPB  1 g Intravenous Q8H   . methocarbamol (ROBAXIN) tablet  500 mg Oral 4x/day   . multivitamin-minerals-folic acid-lycopene-lutein (CERTAVITE SENIOR) tablet  1 Tablet Oral Q24H   . NS 250 mL flush bag   Intravenous Q15 Min PRN   . NS flush syringe  2-6 mL Intracatheter Q8HRS   . NS flush syringe  2-6 mL Intracatheter Q1 MIN PRN   . NS flush syringe  10-30 mL Intracatheter Q8HRS   . NS flush syringe  20-30 mL Intracatheter Q1 MIN PRN   . octreotide (SANDOSTATIN) 100 mcg/mL injection  100 mcg Intravenous 3x/day   . ondansetron (ZOFRAN) 2 mg/mL injection  4 mg Intravenous Q6H PRN   . oxyCODONE (ROXICODONE) immediate release tablet  2.5 mg Oral Q4H PRN   . oxyCODONE (ROXICODONE)  immediate release tablet  5 mg Oral Q4H PRN   . pantoprazole (PROTONIX) delayed release tablet  40 mg Oral Q12H   . prochlorperazine (COMPAZINE) 5 mg/mL injection  10 mg Intravenous Q6H PRN   . [Held by provider] sennosides-docusate sodium (SENOKOT-S) 8.6-'50mg'$  per tablet  1 Tablet Oral 2x/day   . SSIP insulin R human (HUMULIN R) 100 units/mL injection  0-12 Units Subcutaneous 4x/day PRN       Junious Dresser. Duanne Limerick, MD   General Surgery  Pager # [phone]

## 2021-05-04 NOTE — Care Management Notes (Signed)
[place] Management Note    Patient Name: [name]  Date of Birth: 02-17-1945  Sex: male  Date/Time of Admission: 04/03/2021  2:01 PM  Room/Bed: 924/A  Payor: MEDICARE / Plan: MEDICARE PART A AND B / Product Type: Medicare /    LOS: 31 days   Primary Care Providers:  [place] (General)    Admitting Diagnosis:  Intra abdominal hemorrhage [R58]    Assessment:     MSW met with Pt and Pt's spouse (Alice) bedside to discuss discharge plans. Notified both that PT is wanting acute rehab, but the [employer] will not cover anything but [facility]. Alice stated they do not have the money to pay for her to continuously have to drive up here to visit Pt. Alice asked about [place] options. Pt requested they have time to discuss this together.     Discharge Plan:  Home vs acute rehab (not psych)      The patient will continue to be evaluated for developing discharge needs.     Case Manager: Elizabeth Palau, [place]  Phone: [phone]

## 2021-05-04 NOTE — Nurses Notes (Signed)
PTT at 0439= 65.5. Heparin gtt infusing at 22units/kg/hr. TPN infusing via PICC without incident. Patient c/o occasional nausea at times.

## 2021-05-04 NOTE — Care Plan (Signed)
[facility]  Physical Therapy Progress Note      Patient Name: [name]  Date of Birth: 10/20/1945  Height:  175.3 cm (5' 9.02")  Weight:  71.8 kg (158 lb 4.6 oz)  Room/Bed: 924/A  Payor: MEDICARE / Plan: MEDICARE PART A AND B / Product Type: Medicare /     Assessment:     Mr. Hackler was alert, pleasant, and cooperative with PT tx. He demonstrated improvement with transitional skills and had improved quality and quantity of gt. He continues to demonstrate generalized weakness, decreased activity tol, impaired balance, and impaired mobility. His mobility is not currently adequate for safe d/c to home but he has good rehab potential. Rec IRF at d/c.    Discharge Needs:   Equipment Recommendation: TBD  Discharge Disposition: inpatient rehabilitation facility    JUSTIFICATION OF DISCHARGE RECOMMENDATION   Based on current diagnosis, functional performance prior to admission, and current functional performance, this patient requires continued PT services in inpatient rehabilitation facility in order to achieve significant functional improvements in these deficit areas: aerobic capacity/endurance, gait, locomotion, and balance, muscle performance.      Plan:   Continue to follow patient according to established plan of care.  The risks/benefits of therapy have been discussed with the patient/caregiver and he/she is in agreement with the established plan of care.     Subjective & Objective:        05/04/21 1540   Therapist Pager   PT Assigned/ Pager # [name] [phone]   Rehab Session   Document Type therapy progress note (daily note)   Total PT Minutes: 28   Patient Effort good   Symptoms Noted During/After Treatment fatigue   [name] Information   Patient Profile Reviewed yes   Patient/Family/Caregiver Comments/Observations Pt was alert and agreeable to PT tx. Wife was at b/s and supportive. Pt states he has been OOB with nursing and has been doing LE exer in bed. Feels like he  is moving his legs more easily.   Medical Lines PIV Line;PICC Line;Peripheral Drain;TPN   Respiratory Status room air   Existing Precautions/Restrictions contact isolation;fall precautions;full code  (MRSA; clear liquid diet)   [name] Observations of Patient Pt was seen at b/s on 9w for PT tx with permission of b/s nurse.   Mutuality/Individual Preferences   Individualized Care Needs OOB with FWW and assist x2   Pre Treatment Status   Pre Treatment Patient Status Patient supine in bed;Call light within reach;Telephone within reach;Sitter select activated   Support Present Pre Treatment  Family present   Communication Pre Treatment  Nurse   Cognitive Assessment/Interventions   Behavior/Mood Observations alert;behavior appropriate to situation, WNL/WFL;cooperative   Attention WNL/WFL   Follows Commands WFL   Pain Assessment   Pretreatment Pain Rating 0/10 - no pain   Posttreatment Pain Rating 0/10 - no pain   Pre/Posttreatment Pain Comment has had some nausea but no pain; no c/o at time of PT tx   Mobility Assessment/Training   Comment Pt was assisted to sitting EOB. Sat for a few minutes then stood to [facility]. Stood for ~ 1 minute then transferred to recliner chair. Wheeled into hallway. Ambulated with FWW and assist x 1 plus assist for lines and chair follow. Pt remained up in chair at b/s at end of tx.   Bed Mobility Assessment/Treatment   Bed Mobility, Assistive Device Head of Bed Elevated;bed rails   Supine-Sit Independence minimum assist (75% patient effort)   Sit  to Supine, Independence not tested   Safety Issues decreased use of arms for pushing/pulling;impaired trunk control for bed mobility   Impairments balance impaired;postural control impaired;strength decreased   Comment Pt was able to move his legs over EOB but needed min A to bring his trunk to upright.   Transfer Assessment/Treatment   Sit-Stand Independence minimum assist (75% patient effort);verbal cues required   Stand-Sit Independence minimum assist  (75% patient effort);verbal cues required   Sit-Stand-Sit, Assist Device walker, front wheeled   Bed-Chair Independence contact guard assist;1 person + 1 person to manage equipment   Bed-Chair-Bed Assist Device walker, front wheeled   Transfer Safety Issues balance decreased during turns;step length decreased;weight-shifting ability decreased   Transfer Impairments balance impaired;endurance;strength decreased   Transfer Comment Pt needed verbal cues for hand placement.   Gait Assessment/Treatment   Independence  contact guard assist;1 person + 1 person to manage equipment   Assistive Device  walker, front wheeled   Distance in Feet 2 ft bed to chair; 70 ft in hall   Gait Speed decreased but improved from last tx   Deviations  cadence decreased;double stance time increased;limb motion velocity decreased;step length decreased;stride length decreased;swing-to-stance ratio decreased;toe-to-floor clearance decreased;weight-shifting ability decreased   Safety Issues  step length decreased;weight-shifting ability decreased   Impairments  balance impaired;endurance;strength decreased;postural control impaired   Comment Pt had slightly improved step height and length with improved cadence was well today.   Balance Skill Training   Comment with FWW for standing   Sitting Balance: Static fair balance   Sitting, Dynamic (Balance) fair - balance   Sit-to-Stand Balance poor + balance   Standing Balance: Static fair - balance   Standing Balance: Dynamic poor + balance   Systems Impairment Contributing to Balance Disturbance musculoskeletal   Identified Impairments Contributing to Balance Disturbance impaired motor control;impaired postural control;decreased strength   Therapeutic Exercise/Activity   Comment Pt completed B LE exer for AP with resisted PF, hip abd/add x 10 reps each; HS with resistance into ext x 5 reps.   Post Treatment Status   Post Treatment Patient Status Patient sitting in bedside chair or w/c;Call light  within reach;Telephone within reach;Sitter select activated   Support Present Post Treatment  Family present   Environmental manager   Plan of Care Review   Plan Of Care Reviewed With patient;spouse   Basic Mobility Am-PAC/6Clicks Score (APPROVED Staff)   Turning in bed without bedrails 3   Lying on back to sitting on edge of flat bed 3   Moving to and from a bed to a chair 3   Standing up from chair 3   Walk in room 3   Climbing 3-5 steps with railing 2   6 Clicks Raw Score total 17   Standardized (t-scale) score 39.67   CMS 0-100% Score 43.83   CMS Modifier CK   Patient Mobility Goal (JHHLM) 5- Stand 1 minute or more 3X/day   Exercise/Activity Level Performed 7- Walked 25 feet or more   Physical Therapy Clinical Impression   Assessment Mr. Shouse was alert, pleasant, and cooperative with PT tx. He demonstrated improvement with transitional skills and had improved quality and quantity of gt. He continues to demonstrate generalized weakness, decreased activity tol, impaired balance, and impaired mobility. His mobility is not currently adequate for safe d/c to home but he has good rehab potential. Rec IRF at d/c.   Anticipated Equipment Needs at Discharge (PT) TBD   Anticipated Discharge Disposition inpatient rehabilitation  facility       Therapist:   Norlene Duel, PT   Pager #: [phone]

## 2021-05-04 NOTE — Nurses Notes (Signed)
Patient is A&O x4. VS stable. Fall precautions maintained. Patient denies any pain at this time.  Assessment per Flowsheet. Patient has no other complaints at this time.

## 2021-05-05 ENCOUNTER — Inpatient Hospital Stay (HOSPITAL_COMMUNITY): Payer: 59

## 2021-05-05 DIAGNOSIS — R58 Hemorrhage, not elsewhere classified: Secondary | ICD-10-CM

## 2021-05-05 DIAGNOSIS — J90 Pleural effusion, not elsewhere classified: Secondary | ICD-10-CM

## 2021-05-05 LAB — MANUAL DIFF AND MORPHOLOGY-SYSMEX
BASOPHIL #: <0.1 10*3/uL (ref <=0.20)
BASOPHIL %: 0 %
EOSINOPHIL #: 0.15 10*3/uL (ref <=0.50)
EOSINOPHIL %: 1 %
LYMPHOCYTE #: 6.43 10*3/uL — ABNORMAL HIGH (ref 1.00–4.80)
LYMPHOCYTE %: 42 %
METAMYELOCYTE %: 1 %
MONOCYTE #: 0.61 10*3/uL (ref 0.20–1.10)
MONOCYTE %: 4 %
NEUTROPHIL #: 7.96 10*3/uL — ABNORMAL HIGH (ref 1.50–7.70)
NEUTROPHIL %: 52 %
NRBC FROM MANUAL DIFF: 1 per 100 WBC
RBC MORPHOLOGY: NORMAL

## 2021-05-05 LAB — CBC
HCT: 27.1 % — ABNORMAL LOW (ref 38.9–52.0)
HGB: 8.5 g/dL — ABNORMAL LOW (ref 13.4–17.5)
MCH: 28.2 pg (ref 26.0–32.0)
MCHC: 31.4 g/dL (ref 31.0–35.5)
MCV: 90 fL (ref 78.0–100.0)
MPV: 11.9 fL (ref 8.7–12.5)
PLATELETS: 524 10*3/uL — ABNORMAL HIGH (ref 150–400)
RBC: 3.01 10*6/uL — ABNORMAL LOW (ref 4.50–6.10)
RDW-CV: 17.4 % — ABNORMAL HIGH (ref 11.5–15.5)
WBC: 17.3 10*3/uL — ABNORMAL HIGH (ref 3.7–11.0)

## 2021-05-05 LAB — URINALYSIS, MICROSCOPIC
RBCS: <1 /hpf (ref <6.0)
WBCS: <1 /hpf (ref <4.0)

## 2021-05-05 LAB — CBC WITH DIFF
HCT: 23.3 % — ABNORMAL LOW (ref 38.9–52.0)
HGB: 7.1 g/dL — ABNORMAL LOW (ref 13.4–17.5)
MCH: 28 pg (ref 26.0–32.0)
MCHC: 30.5 g/dL — ABNORMAL LOW (ref 31.0–35.5)
MCV: 91.7 fL (ref 78.0–100.0)
MPV: 12.3 fL (ref 8.7–12.5)
PLATELETS: 472 10*3/uL — ABNORMAL HIGH (ref 150–400)
RBC: 2.54 10*6/uL — ABNORMAL LOW (ref 4.50–6.10)
RDW-CV: 17.2 % — ABNORMAL HIGH (ref 11.5–15.5)
WBC: 15.3 10*3/uL — ABNORMAL HIGH (ref 3.7–11.0)

## 2021-05-05 LAB — URINALYSIS, MACROSCOPIC
BILIRUBIN: NEGATIVE mg/dL
COLOR: NORMAL
GLUCOSE: 200 mg/dL — AB
KETONES: NEGATIVE mg/dL
LEUKOCYTES: NEGATIVE WBCs/uL
NITRITE: NEGATIVE
PH: 7 (ref 5.0–8.0)
PROTEIN: NEGATIVE mg/dL
SPECIFIC GRAVITY: 1.021 (ref 1.005–1.030)
UROBILINOGEN: NEGATIVE mg/dL

## 2021-05-05 LAB — ECG 12-LEAD
Atrial Rate: 113 {beats}/min
Calculated P Axis: 45 degrees
Calculated R Axis: 29 degrees
Calculated T Axis: 65 degrees
PR Interval: 146 ms
QRS Duration: 72 ms
QT Interval: 332 ms
QTC Calculation: 455 ms
Ventricular rate: 113 {beats}/min

## 2021-05-05 LAB — VENOUS BLOOD GAS WITH LACTATE REFLEX
%FIO2 (VENOUS): 36 %
BASE EXCESS: 5.4 mmol/L — ABNORMAL HIGH (ref <=3.0)
BICARBONATE (VENOUS): 29 mmol/L — ABNORMAL HIGH (ref 22.0–26.0)
LACTATE: 1.8 mmol/L — ABNORMAL HIGH (ref 0.0–1.3)
PCO2 (VENOUS): 42 mm/Hg (ref 41–51)
PH (VENOUS): 7.46 — ABNORMAL HIGH (ref 7.31–7.41)
PO2 (VENOUS): 58 mm/Hg — ABNORMAL HIGH (ref 35–50)

## 2021-05-05 LAB — BASIC METABOLIC PANEL
ANION GAP: 8 mmol/L (ref 4–13)
BUN/CREA RATIO: 15 (ref 6–22)
BUN: 12 mg/dL (ref 8–25)
CALCIUM: 8.3 mg/dL — ABNORMAL LOW (ref 8.8–10.2)
CHLORIDE: 100 mmol/L (ref 96–111)
CO2 TOTAL: 27 mmol/L (ref 23–31)
CREATININE: 0.78 mg/dL (ref 0.75–1.35)
ESTIMATED GFR: >90 mL/min/BSA (ref >=60)
GLUCOSE: 207 mg/dL — ABNORMAL HIGH (ref 65–125)
POTASSIUM: 4 mmol/L (ref 3.5–5.1)
SODIUM: 135 mmol/L — ABNORMAL LOW (ref 136–145)

## 2021-05-05 LAB — POC BLOOD GLUCOSE (RESULTS)
GLUCOSE, POC: 189 mg/dl — ABNORMAL HIGH (ref 70–105)
GLUCOSE, POC: 197 mg/dl — ABNORMAL HIGH (ref 70–105)
GLUCOSE, POC: 226 mg/dl — ABNORMAL HIGH (ref 70–105)
GLUCOSE, POC: 100 mg/dl (ref 70–105)

## 2021-05-05 LAB — PTT (PARTIAL THROMBOPLASTIN TIME)
APTT: 69.5 seconds — ABNORMAL HIGH (ref 24.2–37.5)
APTT: 79.8 seconds — ABNORMAL HIGH (ref 24.2–37.5)
APTT: 74.5 seconds — ABNORMAL HIGH (ref 24.2–37.5)
APTT: 95.7 seconds — ABNORMAL HIGH (ref 24.2–37.5)

## 2021-05-05 LAB — MAGNESIUM: MAGNESIUM: 1.8 mg/dL (ref 1.8–2.6)

## 2021-05-05 LAB — PHOSPHORUS: PHOSPHORUS: 2.5 mg/dL (ref 2.3–4.0)

## 2021-05-05 MED ORDER — IOPAMIDOL 370 MG IODINE/ML (76 %) INTRAVENOUS SOLUTION
100.0000 mL | INTRAVENOUS | Status: AC
Start: 2021-05-05 — End: 2021-05-05
  Administered 2021-05-05: 100 mL via INTRAVENOUS

## 2021-05-05 MED ORDER — ACETAMINOPHEN 500 MG TABLET
500.0000 mg | ORAL_TABLET | ORAL | Status: DC | PRN
Start: 2021-05-05 — End: 2021-05-28
  Administered 2021-05-05 – 2021-05-26 (×10): 500 mg via ORAL

## 2021-05-05 MED ORDER — LACTATED RINGERS INTRAVENOUS SOLUTION
INTRAVENOUS | Status: DC
Start: 2021-05-05 — End: 2021-05-13
  Administered 2021-05-13: 0 mL via INTRAVENOUS

## 2021-05-05 MED ORDER — WATER FOR INJECTION, STERILE INTRAVENOUS SOLUTION
INTRAVENOUS | Status: AC
Start: 2021-05-05 — End: 2021-05-06
  Filled 2021-05-05: qty 1100

## 2021-05-05 NOTE — Nurses Notes (Addendum)
Report called to Manuela Schwartz on 9S, all questions answered.     Blood cultures x2 collected, VBG sent to lab.    Belongings gathered. Wife updated and at bedside.

## 2021-05-05 NOTE — Progress Notes (Signed)
[facility]  Surgical Oncology  Progress Note      [name], [name], 76 y.o. male  Date of Birth:  [birth year]  Date of Admission:  04/03/2021  Date of service: 05/05/2021    Assessment:  This is a 76 y.o. 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 He underwent massive transfusion protocol and ex lap on 04/03/21 at an outside facility with ligation of a bleeding vessel in the liver bed. Patient was subsequently transferred to [facility] postoperaively, intubated. Patient was subsequently extubated later that day on 3/4. 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      Plan/Recommendations:  - I/D consult for fungemia   - Blood cultures 04/26/21 NGTD   - ECHO no evidence of endocarditis    - Continue fluconazole, daptomycin, and meropenem  - Diet: Continue clears as tolerated   PICC line in place   Continue TPN, likely advance to goal today  - Peripancreatic fluid collections   - Continue octreotide   - GI scope on 3/23 unable to stent PD duct, patient with pancreatic divisim              - IR consulted; RUQ drain placement and empiric embolization 3/10              - Blood cultures 3/9: + pseudomonas    - Cultures form R JP: Pseudomonas and enterococcus              - Repeat Blood cultures 3/12 negative   - ID consulted; continue Daptomycin and meropenum (stop date 4/7)   - CT scan this am to evaluate fluid collections. Will follow up on read  - PE   - s/p IVC filter placement 3/17   - Aggressive pulm toilet, supp off O2   - Continue heparin gtt  - IVF: TPN 70m/hr  - 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 Pending clinical course. Appreciate care  management continued evaluation and assistance.      Subjective:   Patient resting in bed. States he is feeling better this morning. Got CT scan this morning. No other acute concerns this am.    Objective  Filed Vitals:    05/04/21 2015 05/05/21 0015 05/05/21 0300 05/05/21 0337   BP: 131/74 (!) 140/77  (!) 143/77   Pulse: 84 98  85   Resp: '16 16 16 16   '$ Temp: 36.8 C (98.2 F) 36.8 C (98.2 F)  36.7 C (98.1 F)   SpO2: 92% 91%  90%     Physical Exam:   GEN:  AOx4, resting in bed, no acute distress  PULM: Normal respiratory effort. Equal/symmetric chest rise.  CV:  Pink, well perfused, RRR  ABD:   Abdomen soft, nontender, nondistended. Lower midline incision with feculent drainage present with ostomy pouch on top.  Pigtail catheter present with tan murky output.  MS: Atraumatic. Moves all extremities.  NEURO:   Alert and oriented to person, place and time.  Cranial nerves grossly intact.    Integumentary:  Pink, warm, and dry  PSYCHOSOCIAL: Pleasant.  Normal affect.     Labs  Results for orders  placed or performed during the hospital encounter of 04/03/21 (from the past 24 hour(s))   PTT (PARTIAL THROMBOPLASTIN TIME) - ONCE   Result Value Ref Range    APTT 53.7 (H) 24.2 - 37.5 seconds    Narrative    Therapeutic range for unfractionated heparin is 60-100 seconds.   PTT (PARTIAL THROMBOPLASTIN TIME) - ONCE   Result Value Ref Range    APTT 164.2 (HH) 24.2 - 37.5 seconds    Narrative    Therapeutic range for unfractionated heparin is 60-100 seconds.   PTT (PARTIAL THROMBOPLASTIN TIME) - ONCE   Result Value Ref Range    APTT 69.5 (H) 24.2 - 37.5 seconds    Narrative    Therapeutic range for unfractionated heparin is 60-100 seconds.   CBC/DIFF    Narrative    The following orders were created for panel order CBC/DIFF.  Procedure                               Abnormality         Status                     ---------                               -----------         ------                     CBC WITH MWUX[[id number]]                 Abnormal            Final result               MANUAL DIFF AND MORPHOLO.Marland KitchenMarland Kitchen[[id number]]  Abnormal            Final result                 Please view results for these tests on the individual orders.   BASIC METABOLIC PANEL   Result Value Ref Range    SODIUM 135 (L) 136 - 145 mmol/L    POTASSIUM 4.0 3.5 - 5.1 mmol/L    CHLORIDE 100 96 - 111 mmol/L    CO2 TOTAL 27 23 - 31 mmol/L    ANION GAP 8 4 - 13 mmol/L    CALCIUM 8.3 (L) 8.8 - 10.2 mg/dL    GLUCOSE 207 (H) 65 - 125 mg/dL    BUN 12 8 - 25 mg/dL    CREATININE 0.78 0.75 - 1.35 mg/dL    BUN/CREA RATIO 15 6 - 22    ESTIMATED GFR >90 >=60 mL/min/BSA    Narrative    Hemolysis can alter results at this level (slight).  Lipemia can alter results at this level (slight).   PHOSPHORUS   Result Value Ref Range    PHOSPHORUS 2.5 2.3 - 4.0 mg/dL    Narrative    Hemolysis can alter results at this level (slight).   MAGNESIUM   Result Value Ref Range    MAGNESIUM 1.8 1.8 - 2.6 mg/dL    Narrative    Hemolysis can alter results at this level (slight).  Lipemia can alter results at this level (slight).   CBC WITH DIFF   Result Value Ref Range    WBC 15.3 (H) 3.7 - 11.0 x10^3/uL  RBC 2.54 (L) 4.50 - 6.10 x10^6/uL    HGB 7.1 (L) 13.4 - 17.5 g/dL    HCT 23.3 (L) 38.9 - 52.0 %    MCV 91.7 78.0 - 100.0 fL    MCH 28.0 26.0 - 32.0 pg    MCHC 30.5 (L) 31.0 - 35.5 g/dL    RDW-CV 17.2 (H) 11.5 - 15.5 %    PLATELETS 472 (H) 150 - 400 x10^3/uL    MPV 12.3 8.7 - 12.5 fL   MANUAL DIFF AND MORPHOLOGY-SYSMEX   Result Value Ref Range    NEUTROPHIL % 52 %    LYMPHOCYTE %  42 %    MONOCYTE % 4 %    EOSINOPHIL % 1 %    BASOPHIL % 0 %    METAMYELOCYTE %  1 %    NEUTROPHIL # 7.96 (H) 1.50 - 7.70 x10^3/uL    LYMPHOCYTE # 6.43 (H) 1.00 - 4.80 x10^3/uL    MONOCYTE # 0.61 0.20 - 1.10 x10^3/uL    EOSINOPHIL # 0.15 <=0.50 x10^3/uL    BASOPHIL # <0.10 <=0.20 x10^3/uL    NRBC FROM MANUAL DIFF 1 per 100 WBC    RBC MORPHOLOGY Normal RBC and PLT Morphology    POC BLOOD GLUCOSE (RESULTS)   Result Value Ref  Range    GLUCOSE, POC 184 (H) 70 - 105 mg/dl   POC BLOOD GLUCOSE (RESULTS)   Result Value Ref Range    GLUCOSE, POC 155 (H) 70 - 105 mg/dl   POC BLOOD GLUCOSE (RESULTS)   Result Value Ref Range    GLUCOSE, POC 161 (H) 70 - 105 mg/dl   POC BLOOD GLUCOSE (RESULTS)   Result Value Ref Range    GLUCOSE, POC 140 (H) 70 - 105 mg/dl   POC BLOOD GLUCOSE (RESULTS)   Result Value Ref Range    GLUCOSE, POC 189 (H) 70 - 105 mg/dl       I/O:  Date 05/04/21 0700 - 05/05/21 0659 05/05/21 0700 - 05/06/21 0659   Shift 0700-1459 1500-2259 2300-0659 24 Hour Total 0700-1459 1500-2259 2300-0659 24 Hour Total   INTAKE   P.O. 825 850 760 1873         Oral 825 850 760 1873       I.V.(mL/kg/hr) 525(0.91) 95(0.17)  620         Volume Infused (electrolyte-A (PLASMALYTE-A) premix infusion) 250   250         Volume Infused (adult custom parenteral nutrition) 275 25  300         Volume Infused (adult custom parenteral nutrition)  70  70       IV Piggyback  70  70         Volume (DAPTOmycin (CUBICIN) 800 mg in NS 50 mL IVPB)  70  70       Shift Total(mL/kg) 1350(18.8) 405(5.64) 240(3.34) 8366(29.47)       OUTPUT   Urine(mL/kg/hr) 1150(2) 520(0.91) 775 2445         Urine (Voided) 1150 [phone]       Drains 125   125         JP Drain Output ([employer] Drain Right;Upper Abdomen) 25   25         Drain Output (Drain (Miscellaneous) #47F pigtail catheter for RLQ fluid collection Lower;Right;Anterior Abdomen) 50   50         Drain Output (Drain (Miscellaneous) peds ostomy bag Lower;Medial Abdomen) 50   50  Shift Total(mL/kg) 8921(19.41) 740(8.14) 775(10.79) 4818(56.31)       Weight (kg) 71.8 71.8 71.8 71.8 71.8 71.8 71.8 71.8       Radiology       Current Medications:  Current Facility-Administered Medications   Medication Dose Route Frequency    acetaminophen (TYLENOL) tablet  500 mg Oral Q4H    adult custom parenteral nutrition   Intravenous Continuous    atorvastatin (LIPITOR) tablet  40 mg Oral Daily with Breakfast    benzonatate  (TESSALON) capsule  100 mg Oral Q8H PRN    DAPTOmycin (CUBICIN) 800 mg in NS 50 mL IVPB  10 mg/kg (Adjusted) Intravenous Q24H    [Held by provider] docusate sodium (COLACE) capsule  100 mg Oral 2x/day    elvitegravir-cobicistat-emtricitrabine-tenofovir (GENVOYA) 150-150-200-10 mg per tablet  1 Tablet Oral Daily    fluconazole (DIFLUCAN) tablet  400 mg Oral Daily    gabapentin (NEURONTIN) capsule  300 mg Oral 3x/day    heparin 25,000 units in NS 250 mL infusion  18 Units/kg/hr (Adjusted) Intravenous Continuous    meropenem (MERREM) 1 g in NS 100 mL IVPB  1 g Intravenous Q8H    methocarbamol (ROBAXIN) tablet  500 mg Oral 4x/day    multivitamin-minerals-folic acid-lycopene-lutein (CERTAVITE SENIOR) tablet  1 Tablet Oral Q24H    NS flush syringe  10-30 mL Intracatheter Q8HRS    NS flush syringe  20-30 mL Intracatheter Q1 MIN PRN    octreotide (SANDOSTATIN) 100 mcg/mL injection  100 mcg Intravenous 3x/day    ondansetron (ZOFRAN) 2 mg/mL injection  4 mg Intravenous Q6H PRN    oxyCODONE (ROXICODONE) immediate release tablet  2.5 mg Oral Q4H PRN    oxyCODONE (ROXICODONE) immediate release tablet  5 mg Oral Q4H PRN    pantoprazole (PROTONIX) delayed release tablet  40 mg Oral Q12H    prochlorperazine (COMPAZINE) 5 mg/mL injection  10 mg Intravenous Q6H PRN    [Held by provider] sennosides-docusate sodium (SENOKOT-S) 8.6-'50mg'$  per tablet  1 Tablet Oral 2x/day    SSIP insulin R human (HUMULIN R) 100 units/mL injection  0-12 Units Subcutaneous 4x/day PRN       Micheline Chapman, MD  Department of Family Medicine, PGY-1  Pager: [phone]      I saw and examined the patient.  I reviewed the resident's note.  I agree with the findings and plan of care as documented in the resident's note.  Any exceptions/additions are edited/noted.    Aaron Edelman A. Cyndi Bender, MD, FACS

## 2021-05-05 NOTE — Nurses Notes (Addendum)
Patient noted to have bowel movements charted out of his "ostomy". Patient does not have an ostomy, but rather a fistula that the ostomy appliance is being used to collect the drainage. MD made aware that patient states he hasn't had a BM in about a week. Looking back at the charting it appears the patients last real bowel movement was 04/30/21.

## 2021-05-05 NOTE — Care Plan (Signed)
[facility]  Occupational Therapy Progress Note    Patient Name: [name]  Date of Birth: 1946/01/22  Height:  175.3 cm (5' 9.02")  Weight:  71.8 kg (158 lb 4.6 oz)  Room/Bed: 924/A  Payor: MEDICARE / Plan: MEDICARE PART A AND B / Product Type: Medicare /     Assessment:    [name] tolerated OT intervention adequately this date. Pt's mobility and ADL participation at time of session were limited by generalized weakness and physical deconditioning, mild dizziness with positional changes, moderate balance deficits, and poor functional activity tolerance. He required an elevated bed surface and mod-max A to safely achieve upright standing position at EOB with cues for safe hand placement and sequencing. Min A required to complete SPT from EOB to bedside chair. Seated grooming routine at sink level and toileting via urinal with setup A - SPV. From OT perspective, at this time continue to recommend acute rehab placement upon d/c once medically appropriate to maximize functional recovery and independence prior to returning home. OT will continue to follow.      Discharge Needs:   Equipment Recommendation: to be determined    Discharge Disposition:  inpatient rehabilitation facility     JUSTIFICATION OF DISCHARGE RECOMMENDATION   Based on current diagnosis, functional performance prior to admission, and current functional performance, this patient requires continued OT services in inpatient rehabilitation facility in order to achieve significant functional improvements.    Plan:   Continue to follow patient according to established plan of care.  The risks/benefits of therapy have been discussed with the patient/caregiver and he/she is in agreement with the established plan of care.     Subjective & Objective:      05/05/21 0931   Therapist Pager   OT Assigned/ Pager # [phone]   Rehab Session   Document Type therapy progress note (daily note)   Total OT Minutes: 17    Patient Effort good   Symptoms Noted During/After Treatment fatigue;dizziness   General Information   Patient Profile Reviewed yes   Onset of Illness/Injury or Date of Surgery 04/03/21   Pertinent History of Current Functional Problem This is a 76 y.o. 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 He underwent massive transfusion protocol and ex lap on 04/03/21 at an outside facility with ligation of a bleeding vessel in the liver bed. Patient was subsequently transferred to [facility] postoperaively, intubated. Patient was subsequently extubated later that day on 3/4. 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   Medical Lines PIV Line;PICC Line;Peripheral Drain;TPN   Respiratory Status room air   Existing Precautions/Restrictions full code;fall precautions;contact isolation   Pre Treatment Status   Pre Treatment Patient Status Patient supine in bed;Call light within reach;Telephone within reach;Sitter select activated;Nurse approved session   Support Present Pre Treatment  None   Mutuality/Individual Preferences   Individualized Care Needs OOB with FWW vs. Denna Haggard and assist x2   Patient-Specific Goals (Include Timeframe) To feel better   Plan of Care Reviewed With patient   Vital Signs   O2 Delivery Pre Treatment room air   O2 Delivery Post Treatment room air   Pain Assessment   Pre/Posttreatment Pain Comment Pt with no c/o pain throughout time of session this date.   Coping/Psychosocial   Observed Emotional  State calm;cooperative;flat   Verbalized Emotional State acceptance   Coping/Psychosocial Response Interventions   Plan Of Care Reviewed With patient   Cognitive Assessment/Interventions   Behavior/Mood Observations alert;flat affect;cooperative   Attention WNL/WFL   Follows Commands WNL;WFL   Mobility Assessment/Training   Mobility  Comment Pt completed SPT from EOB to bedside recliner chair with use of FWW, min A x1 provided for balance, steadying and cues for proper sequencing and FWW management.   Bed Mobility Assessment/Treatment   Bed Mobility, Assistive Device Head of Bed Elevated;bed rails   Supine-Sit Independence minimum assist (75% patient effort)   Sit to Supine, Independence not tested   Safety Issues decreased use of arms for pushing/pulling;impaired trunk control for bed mobility   Impairments balance impaired;endurance;flexibility decreased;postural control impaired;strength decreased   Comment Min A for trunk guidance to upright position.   Transfer Assessment/Treatment   Sit-Stand Independence moderate assist (50% patient effort);verbal cues required   Stand-Sit Independence minimum assist (75% patient effort);verbal cues required   Sit-Stand-Sit, Assist Device walker, front wheeled   Bed-Chair Independence minimum assist (75% patient effort);verbal cues required   Bed-Chair-Bed Assist Device walker, front wheeled   Transfer Impairments balance impaired;endurance;postural control impaired;strength decreased   Transfer Comment Attempted initial STS transfer from EOB with max A and cues for safe hand placement though unable to successfully achieve upright standing. With elevated bed surface and seated rest, pt able to achieve standing with mod A for upward force and significantly increased time and effort required.   ADL Assessment/Intervention   ADL Comments seated grooming, seated toileting via urinal   Toileting Assessment/Training   Assistive Devices urinal   Position supported sitting   Independence Level  set up required;supervision required   Impairments activity tolerance impaired;balance impaired;postural control impaired;strength decreased   Grooming Assessment/Training   Position supported sitting   Independence Level set up required;supervision required   Impairments activity tolerance impaired;balance  impaired;strength decreased   Comment Oral hygiene while seated in bedside recliner chair.   Motor Skills/Interventions   Additional Documentation Functional Endurance (Group)   Information systems manager   Comment with use of FWW in standing   Sitting Balance: Static fair balance   Sitting, Dynamic (Balance) fair balance   Sit-to-Stand Balance poor + balance   Standing Balance: Static poor + balance   Standing Balance: Dynamic poor - balance   Systems Impairment Contributing to Balance Disturbance musculoskeletal   Identified Impairments Contributing to Balance Disturbance impaired postural control;decreased strength   Functional Endurance Training   Comment, Functional Endurance limited by weakness and fatigue   Post Treatment Status   Post Treatment Patient Status Patient sitting in bedside chair or w/c;Call light within reach;Telephone within reach;Sitter select activated   Support Present Post Treatment  None   Financial trader Nurse   Clinical Impression   Functional Level at Time of Session [name] tolerated OT intervention adequately this date. Pt's mobility and ADL participation at time of session were limited by generalized weakness and physical deconditioning, mild dizziness with positional changes, moderate balance deficits, and poor functional activity tolerance. He required an elevated bed surface and mod-max A to safely achieve upright standing position at EOB with cues for safe hand placement and sequencing. Min A required to complete SPT from EOB to bedside chair. Seated grooming routine at sink level and toileting via urinal with setup A - SPV. From OT perspective, at this time continue to recommend acute rehab placement upon d/c once medically appropriate to maximize functional  recovery and independence prior to returning home. OT will continue to follow.   Anticipated Equipment Needs at Discharge to be determined   Anticipated Discharge Disposition inpatient rehabilitation  facility   Highest level of Mobility score   Exercise/Activity Level Performed 4- Transferred to chair/commode       Therapist:   Roosvelt Harps, MOT, OTR/L  Pager #: [phone]

## 2021-05-05 NOTE — Nurses Notes (Signed)
Surgical Onc paged x2 with no reply, Requesting pt transfer to stepdown as not appropriate for the floor due to acute changes.     05/05/21 1351   Vital Signs   Temperature (!) 38.8 C (101.8 F)  (RN notified)   Temp Source Oral   Heart Rate (!) 108  (RN notified)   Respiratory Rate 18   BP (Non-Invasive) (!) 150/84  (RN notified)   BP Source (Non-Invasive) C;Manual;LA   Patient Position Semi-fowlers (less than 30 degrees)   Oxygen Therapy   SpO2 97 %   O2 Delivery Source  NC   Flow (L/min) (Oxygen Therapy) 4

## 2021-05-05 NOTE — Nurses Notes (Signed)
PTT therapeutic x2. Cont rate at 20/u/kg to LUA TLC.

## 2021-05-05 NOTE — Nurses Notes (Signed)
Surg Onc paged for change of condition and vital signs. Pt reports not feeling good since episode of nausea and dry heaves this AM.      05/05/21 1215   Vital Signs   Temperature (!) 38.2 C (100.8 F)  (RN notified)   Temp Source Oral   Heart Rate (!) 117  (RN notified)   Respiratory Rate 20   BP (Non-Invasive) (!) 184/90  (RN notified)   BP Source (Non-Invasive) C;Manual;LA   Patient Position Semi-fowlers (less than 30 degrees)   Oxygen Therapy   SpO2 (!) 85 %  (RN notified)   Pulse Ox Frequency intermittent   O2 Delivery Source  RA

## 2021-05-06 ENCOUNTER — Inpatient Hospital Stay (HOSPITAL_COMMUNITY): Payer: 59

## 2021-05-06 ENCOUNTER — Encounter (HOSPITAL_COMMUNITY)
Admission: AD | Disposition: A | Discharge status: Rehabilitation Facility | Payer: Self-pay | Source: Other Acute Inpatient Hospital | Attending: SURGICAL ONCOLOGY

## 2021-05-06 ENCOUNTER — Encounter (HOSPITAL_COMMUNITY): Payer: Self-pay | Admitting: SURGICAL ONCOLOGY

## 2021-05-06 ENCOUNTER — Ambulatory Visit (HOSPITAL_BASED_OUTPATIENT_CLINIC_OR_DEPARTMENT_OTHER): Payer: Self-pay | Admitting: NURSE PRACTITIONER

## 2021-05-06 ENCOUNTER — Inpatient Hospital Stay (HOSPITAL_COMMUNITY): Payer: 59 | Admitting: Anesthesiology

## 2021-05-06 ENCOUNTER — Encounter (INDEPENDENT_AMBULATORY_CARE_PROVIDER_SITE_OTHER): Payer: Self-pay | Admitting: Student in an Organized Health Care Education/Training Program

## 2021-05-06 DIAGNOSIS — K863 Pseudocyst of pancreas: Secondary | ICD-10-CM

## 2021-05-06 DIAGNOSIS — E785 Hyperlipidemia, unspecified: Secondary | ICD-10-CM

## 2021-05-06 DIAGNOSIS — E119 Type 2 diabetes mellitus without complications: Secondary | ICD-10-CM

## 2021-05-06 DIAGNOSIS — E039 Hypothyroidism, unspecified: Secondary | ICD-10-CM

## 2021-05-06 DIAGNOSIS — K8592 Acute pancreatitis with infected necrosis, unspecified: Secondary | ICD-10-CM

## 2021-05-06 DIAGNOSIS — Z029 Encounter for administrative examinations, unspecified: Secondary | ICD-10-CM

## 2021-05-06 LAB — BASIC METABOLIC PANEL
ANION GAP: 5 mmol/L (ref 4–13)
BUN/CREA RATIO: 24 — ABNORMAL HIGH (ref 6–22)
BUN: 15 mg/dL (ref 8–25)
CALCIUM: 8.6 mg/dL — ABNORMAL LOW (ref 8.8–10.2)
CHLORIDE: 99 mmol/L (ref 96–111)
CO2 TOTAL: 28 mmol/L (ref 23–31)
CREATININE: 0.62 mg/dL — ABNORMAL LOW (ref 0.75–1.35)
ESTIMATED GFR: >90 mL/min/BSA (ref >=60)
GLUCOSE: 207 mg/dL — ABNORMAL HIGH (ref 65–125)
POTASSIUM: 3.4 mmol/L — ABNORMAL LOW (ref 3.5–5.1)
SODIUM: 132 mmol/L — ABNORMAL LOW (ref 136–145)

## 2021-05-06 LAB — MANUAL DIFF AND MORPHOLOGY-SYSMEX
BASOPHIL #: <0.1 10*3/uL (ref <=0.20)
BASOPHIL %: 0 %
EOSINOPHIL #: 0.15 10*3/uL (ref <=0.50)
EOSINOPHIL %: 1 %
LYMPHOCYTE #: 5.92 10*3/uL — ABNORMAL HIGH (ref 1.00–4.80)
LYMPHOCYTE %: 40 %
METAMYELOCYTE %: 1 %
MONOCYTE #: 0.74 10*3/uL (ref 0.20–1.10)
MONOCYTE %: 5 %
MYELOCYTE %: 1 %
NEUTROPHIL #: 7.7 10*3/uL (ref 1.50–7.70)
NEUTROPHIL %: 52 %
NRBC FROM MANUAL DIFF: 1 per 100 WBC
RBC MORPHOLOGY: NORMAL

## 2021-05-06 LAB — CBC WITH DIFF
HCT: 24.7 % — ABNORMAL LOW (ref 38.9–52.0)
HGB: 7.6 g/dL — ABNORMAL LOW (ref 13.4–17.5)
MCH: 27.4 pg (ref 26.0–32.0)
MCHC: 30.8 g/dL — ABNORMAL LOW (ref 31.0–35.5)
MCV: 89.2 fL (ref 78.0–100.0)
MPV: 12.4 fL (ref 8.7–12.5)
PLATELETS: 401 10*3/uL — ABNORMAL HIGH (ref 150–400)
RBC: 2.77 10*6/uL — ABNORMAL LOW (ref 4.50–6.10)
RDW-CV: 17.2 % — ABNORMAL HIGH (ref 11.5–15.5)
WBC: 14.8 10*3/uL — ABNORMAL HIGH (ref 3.7–11.0)

## 2021-05-06 LAB — POC BLOOD GLUCOSE (RESULTS)
GLUCOSE, POC: 204 mg/dl — ABNORMAL HIGH (ref 70–105)
GLUCOSE, POC: 224 mg/dl — ABNORMAL HIGH (ref 70–105)
GLUCOSE, POC: 271 mg/dl — ABNORMAL HIGH (ref 70–105)
GLUCOSE, POC: 272 mg/dl — ABNORMAL HIGH (ref 70–105)
GLUCOSE, POC: 278 mg/dl — ABNORMAL HIGH (ref 70–105)

## 2021-05-06 LAB — MAGNESIUM: MAGNESIUM: 1.8 mg/dL (ref 1.8–2.6)

## 2021-05-06 LAB — PHOSPHORUS: PHOSPHORUS: 2.4 mg/dL (ref 2.3–4.0)

## 2021-05-06 SURGERY — ENDOSCOPIC U/S WITH CYST GASTROSTOMY
Anesthesia: General | Site: Mouth | Wound class: Clean Contaminated Wounds-The respiratory, GI, Genital, or urinary

## 2021-05-06 MED ORDER — POTASSIUM BICARBONATE-CITRIC ACID 20 MEQ EFFERVESCENT TABLET
40.0000 meq | EFFERVESCENT_TABLET | ORAL | Status: AC
Start: 2021-05-06 — End: 2021-05-06
  Administered 2021-05-06: 40 meq via ORAL
  Filled 2021-05-06: qty 2

## 2021-05-06 MED ORDER — WATER FOR INJECTION, STERILE INTRAVENOUS SOLUTION
INTRAVENOUS | Status: AC
Start: 2021-05-06 — End: 2021-05-07
  Filled 2021-05-06: qty 1300

## 2021-05-06 MED ORDER — LACTATED RINGERS INTRAVENOUS SOLUTION
INTRAVENOUS | Status: DC
Start: 2021-05-06 — End: 2021-05-07
  Administered 2021-05-06: 0 mL via INTRAVENOUS

## 2021-05-06 MED ORDER — INSULIN GLARGINE-YFGN (U-100) 100 UNIT/ML SUBCUTANEOUS SOLUTION
10.0000 [IU] | Freq: Every evening | SUBCUTANEOUS | Status: DC
Start: 2021-05-06 — End: 2021-05-07
  Administered 2021-05-06: 10 [IU] via SUBCUTANEOUS
  Filled 2021-05-06: qty 10

## 2021-05-06 MED ORDER — FENTANYL (PF) 50 MCG/ML INJECTION SOLUTION
INTRAMUSCULAR | Status: AC
Start: 2021-05-06 — End: 2021-05-06
  Filled 2021-05-06: qty 2

## 2021-05-06 MED ORDER — LACTATED RINGERS INTRAVENOUS SOLUTION
INTRAVENOUS | Status: DC
Start: 2021-05-06 — End: 2021-05-06

## 2021-05-06 MED ORDER — ACETAMINOPHEN 1,000 MG/100 ML (10 MG/ML) INTRAVENOUS SOLUTION
Freq: Once | INTRAVENOUS | Status: DC | PRN
Start: 2021-05-06 — End: 2021-05-06
  Administered 2021-05-06: 1000 mg via INTRAVENOUS

## 2021-05-06 MED ORDER — FENTANYL (PF) 50 MCG/ML INJECTION SOLUTION
Freq: Once | INTRAMUSCULAR | Status: DC | PRN
Start: 2021-05-06 — End: 2021-05-06
  Administered 2021-05-06: 100 ug via INTRAVENOUS
  Administered 2021-05-06 (×2): 50 ug via INTRAVENOUS

## 2021-05-06 MED ORDER — FENTANYL (PF) 50 MCG/ML INJECTION SOLUTION
25.0000 ug | INTRAMUSCULAR | Status: DC | PRN
Start: 2021-05-06 — End: 2021-05-06

## 2021-05-06 MED ORDER — PHENYLEPHRINE 1 MG/10 ML (100 MCG/ML) IN 0.9 % SOD.CHLORIDE IV SYRINGE
INJECTION | Freq: Once | INTRAVENOUS | Status: DC | PRN
Start: 2021-05-06 — End: 2021-05-06
  Administered 2021-05-06 (×4): 200 ug via INTRAVENOUS

## 2021-05-06 MED ORDER — PROPOFOL 10 MG/ML IV BOLUS
INJECTION | Freq: Once | INTRAVENOUS | Status: DC | PRN
Start: 2021-05-06 — End: 2021-05-06
  Administered 2021-05-06: 150 mg via INTRAVENOUS
  Administered 2021-05-06: 50 mg via INTRAVENOUS

## 2021-05-06 MED ORDER — SODIUM CHLORIDE 0.9% FLUSH BAG - 250 ML
INTRAVENOUS | Status: DC | PRN
Start: 2021-05-06 — End: 2021-05-28
  Administered 2021-05-19 – 2021-05-20 (×2): 20 mL via INTRAVENOUS

## 2021-05-06 MED ORDER — EPHEDRINE SULFATE 50 MG/ML INTRAVENOUS SOLUTION
Freq: Once | INTRAVENOUS | Status: DC | PRN
Start: 2021-05-06 — End: 2021-05-06
  Administered 2021-05-06 (×2): 10 mg via INTRAVENOUS

## 2021-05-06 MED ORDER — SODIUM CHLORIDE 0.9 % (FLUSH) INJECTION SYRINGE
2.0000 mL | INJECTION | INTRAMUSCULAR | Status: DC | PRN
Start: 2021-05-06 — End: 2021-05-28

## 2021-05-06 MED ORDER — FENTANYL (PF) 50 MCG/ML INJECTION SOLUTION
12.5000 ug | INTRAMUSCULAR | Status: DC | PRN
Start: 2021-05-06 — End: 2021-05-06

## 2021-05-06 MED ORDER — PROPOFOL 10 MG/ML INTRAVENOUS EMULSION
INTRAVENOUS | Status: AC
Start: 2021-05-06 — End: 2021-05-06
  Filled 2021-05-06: qty 20

## 2021-05-06 MED ORDER — ROCURONIUM 10 MG/ML INTRAVENOUS SOLUTION
Freq: Once | INTRAVENOUS | Status: DC | PRN
Start: 2021-05-06 — End: 2021-05-06
  Administered 2021-05-06: 10 mg via INTRAVENOUS

## 2021-05-06 MED ORDER — SUCCINYLCHOLINE 20 MG/ML INTRAVENOUS WRAPPER
INJECTION | Freq: Once | INTRAVENOUS | Status: DC | PRN
Start: 2021-05-06 — End: 2021-05-06
  Administered 2021-05-06: 160 mg via INTRAVENOUS

## 2021-05-06 MED ORDER — DEXAMETHASONE SODIUM PHOSPHATE 4 MG/ML INJECTION SOLUTION
Freq: Once | INTRAMUSCULAR | Status: DC | PRN
Start: 2021-05-06 — End: 2021-05-06
  Administered 2021-05-06: 4 mg via INTRAVENOUS

## 2021-05-06 MED ORDER — ONDANSETRON HCL (PF) 4 MG/2 ML INJECTION SOLUTION
Freq: Once | INTRAMUSCULAR | Status: DC | PRN
Start: 2021-05-06 — End: 2021-05-06
  Administered 2021-05-06: 4 mg via INTRAVENOUS

## 2021-05-06 MED ORDER — DEXTROSE 5% IN WATER (D5W) FLUSH BAG - 250 ML
INTRAVENOUS | Status: DC | PRN
Start: 2021-05-06 — End: 2021-05-28

## 2021-05-06 MED ORDER — SODIUM CHLORIDE 0.9 % (FLUSH) INJECTION SYRINGE
2.0000 mL | INJECTION | Freq: Three times a day (TID) | INTRAMUSCULAR | Status: DC
Start: 2021-05-06 — End: 2021-05-28
  Administered 2021-05-06: 0 mL
  Administered 2021-05-06 – 2021-05-07 (×2): 2 mL
  Administered 2021-05-07: 6 mL
  Administered 2021-05-07 – 2021-05-08 (×2): 0 mL
  Administered 2021-05-08: 6 mL
  Administered 2021-05-09: 0 mL
  Administered 2021-05-09: 6 mL
  Administered 2021-05-09: 2 mL
  Administered 2021-05-10: 5 mL
  Administered 2021-05-10: 6 mL
  Administered 2021-05-10: 0 mL
  Administered 2021-05-11: 6 mL
  Administered 2021-05-11: 0 mL
  Administered 2021-05-11: 6 mL
  Administered 2021-05-12: 2 mL
  Administered 2021-05-12 (×2): 0 mL
  Administered 2021-05-13: 2 mL
  Administered 2021-05-13: 0 mL
  Administered 2021-05-13: 2 mL
  Administered 2021-05-14: 0 mL
  Administered 2021-05-14: 2 mL
  Administered 2021-05-14 – 2021-05-15 (×2): 6 mL
  Administered 2021-05-15: 5 mL
  Administered 2021-05-15: 6 mL
  Administered 2021-05-16: 0 mL
  Administered 2021-05-16: 3 mL
  Administered 2021-05-16 – 2021-05-17 (×2): 6 mL
  Administered 2021-05-17 (×2): 0 mL
  Administered 2021-05-18: 6 mL
  Administered 2021-05-18 – 2021-05-19 (×5): 0 mL
  Administered 2021-05-20: 2 mL
  Administered 2021-05-20 – 2021-05-21 (×5): 0 mL
  Administered 2021-05-22: 2 mL
  Administered 2021-05-22: 6 mL
  Administered 2021-05-22 – 2021-05-23 (×2): 0 mL
  Administered 2021-05-23: 3 mL
  Administered 2021-05-23: 6 mL
  Administered 2021-05-24: 3 mL
  Administered 2021-05-24: 0 mL
  Administered 2021-05-24 – 2021-05-25 (×2): 5 mL
  Administered 2021-05-25: 2 mL
  Administered 2021-05-25 – 2021-05-28 (×8): 0 mL
  Administered 2021-05-28: 3 mL

## 2021-05-06 MED ORDER — INSULIN REGULAR HUMAN 100 UNIT/ML INJECTION SSIP
0.0000 [IU] | INJECTION | Freq: Four times a day (QID) | SUBCUTANEOUS | Status: DC | PRN
Start: 2021-05-06 — End: 2021-05-07
  Administered 2021-05-06 (×2): 6 [IU] via SUBCUTANEOUS
  Filled 2021-05-06: qty 300

## 2021-05-06 MED ORDER — LACTATED RINGERS INTRAVENOUS SOLUTION
INTRAVENOUS | Status: DC | PRN
Start: 2021-05-06 — End: 2021-05-06

## 2021-05-06 MED ORDER — INSULIN U-100 REGULAR HUMAN 100 UNIT/ML INJECTION SOLUTION
6.0000 [IU] | Freq: Four times a day (QID) | INTRAMUSCULAR | Status: DC
Start: 2021-05-06 — End: 2021-05-07
  Administered 2021-05-06 – 2021-05-07 (×3): 6 [IU] via SUBCUTANEOUS

## 2021-05-06 MED ORDER — DEXAMETHASONE SODIUM PHOSPHATE 4 MG/ML INJECTION SOLUTION
INTRAMUSCULAR | Status: AC
Start: 2021-05-06 — End: 2021-05-06
  Filled 2021-05-06: qty 1

## 2021-05-06 MED ORDER — ONDANSETRON HCL (PF) 4 MG/2 ML INJECTION SOLUTION
INTRAMUSCULAR | Status: AC
Start: 2021-05-06 — End: 2021-05-06
  Filled 2021-05-06: qty 2

## 2021-05-06 SURGICAL SUPPLY — 14 items
DILATOR ENDOS CRE 180CM 5.5CM 12-13.5-15MM 7.5FR ESOPH PYL COLON BAL LOW PROF GW PEBAX STRL LF  DISP (ENDOSCOPIC SUPPLIES) ×1 IMPLANT
DILATOR ENDOS CRE 180CM 5.5CM_12-13.5-15MM 7.5FR ESOPH PYL (INSTRUMENTS ENDOMECHANICAL) ×1
ELECTRODE PATIENT RTN 9FT VLAB C30- LB RM PHSV ACRL FOAM CORD NONIRRITATE NONSENSITIZE ADH STRP (SURGICAL CUTTING SUPPLIES) ×1 IMPLANT
ELECTRODE PATIENT RTN 9FT VLAB_REM C30- LB PLHSV ACRL FOAM (CUTTING ELEMENTS) ×1
GW ENDOS .035IN 260CM 5CM JAGWIRE DRM TIP RND STR XTD KINK RST RX STF SHAFT BIL STRL DISP (ENDOSCOPIC SUPPLIES) ×1 IMPLANT
GW ENDOS .035IN 260CM 5CM JAGW_RE DRM TIP RND STR XTD KINK (INSTRUMENTS ENDOMECHANICAL) ×1
INTROD ENDOS 10FR 6FR 205CM RADOPQ OAS TRCR M DRCT STRL DISP PURP ACPT .035IN GW (ENDOSCOPIC SUPPLIES) ×1 IMPLANT
INTROD ENDOS 10FR 6FR 205CM RA_DOPQ OAS TRCR M DRCT STRL DISP (INSTRUMENTS ENDOMECHANICAL) ×1
SNARE SPRL 230CM 2.8MM 20MM SNAREMASTER RIDGE WRE ENDOS PLPCTM STRL LF  DISP (ENDOSCOPIC SUPPLIES) ×1 IMPLANT
SNARE SPRL 230CM 2.8MM 20MM SN_AREMASTER RIDGE WRE ENDOS (INSTRUMENTS ENDOMECHANICAL) ×1
STENT ADVANIX 10FR 5CM BIL RX TMPRY TAPER TIP PLASTIC TW 2 PGTL CURVE ACPT .035IN GW (STENTS VASCULAR) ×2 IMPLANT
STENT AXS 15MM 15MM PANC CAUT ENH DEL SYS STRL LF (STENTS GASTROINTESTINAL) ×1 IMPLANT
SYRINGE INFLAT ALN II GA STRL DISP 60ML (ENDOSCOPIC SUPPLIES) ×1 IMPLANT
SYRINGE INFLAT ALN II GA STRL_DISP 60ML (INSTRUMENTS ENDOMECHANICAL) ×1

## 2021-05-06 NOTE — Anesthesia Transfer of Care (Signed)
ANESTHESIA TRANSFER OF CARE   [name] is a 76 y.o. ,male, Weight: 71.8 kg (158 lb 4.6 oz)   had Procedure(s):  ENDOSCOPIC U/S WITH CYST GASTROSTOMY  performed  05/06/21   Primary Service: Nicola Police*    Past Medical History:   Diagnosis Date   . Cancer (CMS [facility])     prostate   . Deep vein thrombosis (DVT) (CMS HCC)     right leg   . Esophageal reflux    . H/O hearing loss    . High cholesterol    . 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 HTN (hypertension)    . 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 Hyperlipidemia     "borderline"   . Hypothyroidism    . MRSA (methicillin resistant staph aureus) culture positive 01/02/2021    MRSA left groin abscess 01/03/21   . MRSA (methicillin resistant staph aureus) culture positive 01/02/2021    MRSA blood 01/02/21   . Pulmonary embolism (CMS HCC) 03/31/2021   . Thyroid disorder    . Wears glasses       Allergy History as of 05/06/21      No Known Allergies              I completed my transfer of care / handoff to the receiving personnel during which we discussed:  Access, Airway, All key/critical aspects of case discussed, Analgesia, Antibiotics, Expectation of post procedure, Fluids/Product, Gave opportunity for questions and acknowledgement of understanding, Labs and PMHx    Post Location: PACU                                          Additional Info:VSS, care transferred to pacu rn                        Last OR Temp: Temperature: 36.2 C (97.2 F)  ABG:  PH (ARTERIAL)   Date Value Ref Range Status   04/04/2021 7.41 7.35 - 7.45 Final     PH (T)   Date Value Ref Range Status   03/22/2021 7.36 7.35 - 7.45 Final     PCO2 (ARTERIAL)   Date Value Ref Range Status   04/04/2021 41 35 - 45 mm/Hg Final     PCO2 (VENOUS)   Date Value Ref Range Status   05/05/2021 42 41 - 51 mm/Hg Final     PO2 (ARTERIAL)   Date Value Ref Range Status   04/04/2021 77 72 - 100 mm/Hg Final     PO2 (VENOUS)   Date Value Ref Range  Status   05/05/2021 58 (H) 35 - 50 mm/Hg Final     SODIUM   Date Value Ref Range Status   03/22/2021 135 (L) 137 - 145 mmol/L Final     POTASSIUM   Date Value Ref Range Status   05/06/2021 3.4 (L) 3.5 - 5.1 mmol/L Final     KETONES   Date Value Ref Range Status   05/05/2021 Negative Negative mg/dL Final     WHOLE BLOOD POTASSIUM   Date Value Ref Range Status   03/22/2021 3.6 3.5 - 4.6 mmol/L Final     CHLORIDE   Date Value Ref Range Status   03/22/2021 106 101 - 111 mmol/L Final  CALCIUM   Date Value Ref Range Status   05/06/2021 8.6 (L) 8.8 - 10.2 mg/dL Final     Calculated P Axis   Date Value Ref Range Status   05/05/2021 45 degrees Final     Calculated R Axis   Date Value Ref Range Status   05/05/2021 29 degrees Final     Calculated T Axis   Date Value Ref Range Status   05/05/2021 65 degrees Final     IONIZED CALCIUM   Date Value Ref Range Status   04/19/2021 1.17 1.10 - 1.35 mmol/L Final     LACTATE   Date Value Ref Range Status   05/05/2021 1.8 (H) 0.0 - 1.3 mmol/L Final     HEMOGLOBIN   Date Value Ref Range Status   03/22/2021 11.1 (L) 12.0 - 18.0 g/dL Final     OXYHEMOGLOBIN   Date Value Ref Range Status   03/22/2021 97.8 85.0 - 98.0 % Final     CARBOXYHEMOGLOBIN   Date Value Ref Range Status   03/22/2021 1.8 0.0 - 2.5 % Final     MET-HEMOGLOBIN   Date Value Ref Range Status   03/22/2021 0.4 0.0 - 2.0 % Final     BASE EXCESS   Date Value Ref Range Status   05/05/2021 5.4 (H) -3.0 - 3.0 mmol/L Final     BASE EXCESS (ARTERIAL)   Date Value Ref Range Status   04/04/2021 1.2 (H) 0.0 - 1.0 mmol/L Final     BASE DEFICIT   Date Value Ref Range Status   04/03/2021 0.2 0.0 - 3.0 mmol/L Final     BICARBONATE (ARTERIAL)   Date Value Ref Range Status   04/04/2021 25.8 18.0 - 26.0 mmol/L Final     BICARBONATE (VENOUS)   Date Value Ref Range Status   05/05/2021 29.0 (H) 22.0 - 26.0 mmol/L Final     TEMPERATURE, COMP   Date Value Ref Range Status   03/22/2021 36.9 15.0 - 40.0 C Final     %FIO2 (VENOUS)   Date Value Ref  Range Status   05/05/2021 36.0 % Final     Airway:* No LDAs found *  Blood pressure (!) 144/84, pulse (!) 114, temperature 36.2 C (97.2 F), resp. rate (!) 9, height 1.753 m (5' 9.02"), weight 71.8 kg (158 lb 4.6 oz), SpO2 99 %.

## 2021-05-06 NOTE — Wound Therapy (Signed)
[facility]  Fistula Assessment     Name: [name]  Date Of Birth: 02/21/1945  MRN: [id number]  Date: 05/06/2021  Time: 12:20    Filed Vitals:    05/06/21 0101 05/06/21 0200 05/06/21 0815 05/06/21 1110   BP:  (!) 143/88 (!) 148/92 (!) 155/88   Pulse:  92 81 85   Resp: 16 19 (!) 10 16   Temp: 36.9 C (98.4 F)  36.8 C (98.2 F) 36.8 C (98.2 F)   SpO2:  97% 97% 98%       Brief Lab:     Pre Albumin:   Lab Results   Component Value Date    PREALBUMIN 45.0 03/15/2021     Alubumin:   Lab Results   Component Value Date    ALBUMIN 1.7 (L) 05/03/2021       Fistula Pouch Changed on This Visit:  YES  Current Pouch Type: Two piece ostomy appliance   Fistula Site: Within mid abdominal incision   Perifistular Skin: Dry, Dimpled, intact   Fistula Output: no output noted in pouch       Recommended Fistula Pouching Appliance: Two piece ostomy appliance   Additional Ostomy Products: moldable ring and adhesive remover    Comments:   Received consult from bedside nurse for leaking ostomy appliance.  Patient sitting up in bed with wife at bedside.  Introduced myself and role as Best boy.  Wife states appliance to abdomin was changed yesterday related to leaking.  Current ostomy appliance with poor seal and leak noted around 6 OC.  Carefully removed appliance and changed as charted.     Procedure:  -Pouch carefully removed with universal remover wipes  -Wound bed cleansed well using normal saline OR wound clenz  -Moldable ring utilized to create even skin contours   -Pouch applied  -Light manual pressure applied for 2 minutes to assist with achieving seal  -Patient encouraged to stay in bed for 30 minutes prior to resuming any activities to achieve appropriate seal  -Patient tolerated procedure well with minimal pain    Fistula Pouching Supplies:   IT sales professional [phone]    Coloplast [place] Flex Filter Drainable Pouch (Red) #[id number]    Accessories:  Architectural technologist Powder  [phone]  No sting skin prep #[id number]  [employer]ister Universal Remover Wipe [phone]  Coloplast Brava Protective Seal - Thin  (1 1/8")   [phone]        Recommendations:   -Nutritional consult for optimal wound healing  -Change pouching system every 5-7 days, PRN  -Specialty surface mattress   -Empty pouch when 1/3-1/2 full of effluent or connect to bedside drainage bag for continuous drainage    -Head of Bed less than 30 degrees when patient can tolerate to decrease friction and shear to coccyx area  -Care Management for Ostomy Supplies, SNF Placement, Home Health    Prescriptions for fistula supplies will be placed under DME order by Care Management Prior to Discharge, please also work with patients preferred medical supply provider.     Interactions:     Please contact supply chain, extension - [phone], for additional supplies.    [facility] Nurse Team will continue to follow patient during hospitalization. Please contact [facility] Nurse Team at [phone] should needs arise.     Gae Gallop, RN, BSN, [facility]  [place] Nurse Team   [phone] or [phone]

## 2021-05-06 NOTE — Care Management Notes (Addendum)
[facility] Management Note    Patient Name: [name]  Date of Birth: [birth year]  Sex: male  Date/Time of Admission: 04/03/2021  2:01 PM  Room/Bed: 973/A  Payor: MEDICARE / Plan: MEDICARE PART A AND B / Product Type: Medicare /    LOS: 33 days   Primary Care Providers:  [place] (General)    Admitting Diagnosis:  Intra abdominal hemorrhage [R58]    Assessment:      05/06/21 1440   Assessment Details   Assessment Type Continued Assessment   Date of Care Management Update 05/06/21   Date of Next DCP Update 05/07/21   Care Management Plan   Discharge Planning Status plan in progress   Projected Discharge Date 05/10/21   Discharge plan discussed with: Patient;Spouse   CM will evaluate for rehabilitation potential yes   Patient choice offered to patient/family yes   Form for patient choice reviewed/signed and on chart no   Facility or [facility]   Discharge Needs Assessment   Equipment Needed After Discharge none   Discharge Facility/Level of Care Needs Acute Rehab Placement/Return (not psych)(code 36)   Transportation Available ambulance       Discharge Plan:  Acute Rehab Placement/Return (not psych) (code 62)    TPN.  Wound care.  Ostomy care.  PT/OT d/c dispo recommendation IPR.  CCC met with patient and spouse at bedside to discuss d/c disposition.  Patient and spouse agreeable to [facility] health.  [place] called [facility] [place] [phone], [facility] X [phone], awaiting return call. Patient will require EMS transport.  CCC anticipates d/c dispo aucte rehab, will continue to assess.    CCC received call from [facility], care in [place].  Per Carloyn Jaeger will notify patricia X [phone] and have MR's reviewed for IPR.    The patient will continue to be evaluated for developing discharge needs.     Case Manager: Kerry Dory, [place]  Phone: [phone]

## 2021-05-06 NOTE — Consults (Signed)
Department of Endocrinology  Consult    Name/MRN: [name], [name] [id number]  Age/DOB: 76 y.o., 05/21/1945    Date of service:05/06/2021  Reason for Consult: Diabetes management   Chief Complaint:  No chief complaint on file.      HISTORY OF PRESENTING ILLNESS:  [name] is a 76 y.o. 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 Endocrinology is consulted for management of diabetes.    SUBJECTIVE:  Patient was seen and examined at bedside. Resting in bed. Wife at bedside. Reports he is nauseas and has been having a lot of dry heaves. He is currently not eating food or drinking liquids, but is getting TPN infusions.     Wife reports patient was not diabetic prior to his pancreatectomy. Post pancreatectomy has been on insulin though which occurred in February 2023. He was supposed to have a follow up with Endocrinology today [facility] APRN) but will need that rescheduled as he is unable to attend while in the hospital.     PAST MEDICAL HISTORY:  Past Medical History:   Diagnosis Date    Cancer (CMS [facility])     prostate    Deep vein thrombosis (DVT) (CMS [facility])     right leg    Esophageal reflux     H/O hearing loss     High cholesterol     History of kidney disease     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    HTN (hypertension)     Human immunodeficiency virus (HIV) disease (CMS [place])     Hyperlipidemia     "borderline"    Hypothyroidism     MRSA (methicillin resistant staph aureus) culture positive 01/02/2021    MRSA left groin abscess 01/03/21    MRSA (methicillin resistant staph aureus) culture positive 01/02/2021    MRSA blood 01/02/21    Pulmonary embolism (CMS [place]) 03/31/2021    Thyroid disorder     Wears glasses      SURGICAL HISTORY:   Past Surgical History:   Procedure Laterality Date    COLON SURGERY      per  patient had 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 Darryl Nestle CERVICAL SPINE SURGERY  2001    HX COLOSTOMY REVERSAL      HX HERNIA REPAIR      KNEE SURGERY Bilateral     WRIST SURGERY Right      HOME MEDICATIONS:  Outpatient Medications Marked as Taking for the 04/03/21 encounter [facility] Encounter)   Medication Sig    acetaminophen (TYLENOL) 500 mg Oral Tablet Take 2 Tablets (1,000 mg total) by mouth Every 4 hours as needed for Pain    amLODIPine (NORVASC) 5 mg Oral Tablet Take 1 Tablet (5 mg total) by mouth Once a day    [EXPIRED] apixaban (ELIQUIS) 5 mg Oral Tablet Take 2 Tablets (10 mg total) by mouth Twice daily for 14 doses    atorvastatin (LIPITOR) 80 mg Oral Tablet Take 0.5 Tablets (40 mg total) by mouth Every morning with breakfast    BIOTIN ORAL Take 1 Tablet by mouth Once a day  Blood Sugar Diagnostic (ACCU-CHEK GUIDE TEST STRIPS) Strip 1 Strip Four times a day - before meals and bedtime    cetirizine (ZYRTEC) 10 mg Oral Tablet Take 1 Tablet (10 mg total) by mouth Once a day    cyanocobalamin (VITAMIN B 12) 1,000 mcg Oral Tablet Take 1 Tablet (1,000 mcg total) by mouth Every morning    elviteg-cob-emtri-tenof ALAFEN (GENVOYA) 150-150-200-10 mg Oral Tablet Take 1 Tablet by mouth Once a day    flash glucose scanning reader (FREESTYLE LIBRE 2 READER) Does not apply Misc Use as directed with FreeStyle Libre 2 sensors    flash glucose sensor (FREESTYLE LIBRE 2 SENSOR) Does not apply Kit To check blood glucose continuously. Change every 14 days    gabapentin (NEURONTIN) 300 mg Oral Capsule Take 1 Capsule (300 mg total) by mouth Three times a day    insulin aspart U-100 (NOVOLOG) 100 unit/mL (3 mL) Subcutaneous Insulin Pen Inject 4 Units under the skin Three times a day With meals    insulin glargine (LANTUS SOLOSTAR U-100 INSULIN) 100 unit/mL Subcutaneous  Insulin Pen Inject 15 units under the skin daily    insulin syr/ndl U100 half mark 0.3 mL 31 gauge x 5/16" Syringe Use a new syringe for each injection.    lancets (FREESTYLE LANCETS) 28 gauge Misc Use to test blood sugar 4 times a day - before meals and bedtime    levothyroxine (SYNTHROID) 88 mcg Oral Tablet Take 1 Tablet (88 mcg total) by mouth Every morning    melatonin 5 mg Oral Tablet Take 1 Tablet (5 mg total) by mouth Every night    MULTIVITAMIN ORAL Take 1 Tablet by mouth Once a day    pantoprazole (PROTONIX) 40 mg Oral Tablet, Delayed Release (E.C.) Take 1 Tablet (40 mg total) by mouth Twice daily 30 minutes before a meal    Pen Needle, Disposable, (BD NANO 2ND GEN PEN NEEDLE) 32 gauge x 5/32" Needle Use a new pen needle for each injection.    POTASSIUM ORAL Take 1 Tablet by mouth Once a day     FAMILY HISTORY:  Family Medical History:       Problem Relation (Age of Onset)    Cancer Mother, Father, Other    Heart Attack Mother    High Cholesterol Other    Stroke Other          SOCIAL HISTORY:  Social History     Tobacco Use    Smoking status: Never    Smokeless tobacco: Never   Vaping Use    Vaping Use: Never used   Substance Use Topics    Alcohol use: Not Currently    Drug use: Never     INPATIENT MEDICATIONS:    Current Facility-Administered Medications:     acetaminophen (TYLENOL) tablet, 500 mg, Oral, Q4H PRN, Croley, Mallory, MD, 500 mg at 05/05/21 1306    adult custom parenteral nutrition, , Intravenous, Continuous, Cosner, Adam, PA-C, Last Rate: 80 mL/hr at 05/05/21 2112, New Bag at 05/05/21 2112    adult custom parenteral nutrition, , Intravenous, Continuous, Cosner, Adam, PA-C    atorvastatin (LIPITOR) tablet, 40 mg, Oral, Daily with Breakfast, Nicola Police, MD, 40 mg at 05/04/21 2423    benzonatate (TESSALON) capsule, 100 mg, Oral, Q8H PRN, [name], MD    DAPTOmycin (CUBICIN) 800 mg in NS 50 mL IVPB, 10 mg/kg (Adjusted), Intravenous, Q24H, Cosner, Adam, PA-C, Stopped at 05/05/21  1522    [Held by provider] docusate sodium (COLACE) capsule, 100  mg, Oral, 2x/day, Cosner, Adam, PA-C, 100 mg at 04/26/21 6754    elvitegravir-cobicistat-emtricitrabine-tenofovir (GENVOYA) 150-150-200-10 mg per tablet, 1 Tablet, Oral, Daily, Monia Pouch, DO, 1 Tablet at 05/05/21 4920    fluconazole (DIFLUCAN) tablet, 400 mg, Oral, Daily, Cosner, Adam, PA-C, 400 mg at 05/04/21 0850    gabapentin (NEURONTIN) capsule, 300 mg, Oral, 3x/day, Ranae Palms, DO, 300 mg at 05/05/21 2058    heparin 25,000 units in NS 250 mL infusion, 18 Units/kg/hr (Adjusted), Intravenous, Continuous, Sowers, Briana, DO, Stopped at 05/06/21 0151    LR premix infusion, , Intravenous, Continuous, Mariea Clonts, MD, Last Rate: 45 mL/hr at 05/05/21 1945, Rate Verify at 05/05/21 1945    meropenem (MERREM) 1 g in NS 100 mL IVPB, 1 g, Intravenous, Q8H, Cosner, Adam, PA-C, Stopped at 05/06/21 0224    methocarbamol (ROBAXIN) tablet, 500 mg, Oral, 4x/day, Larna Daughters, MD, 500 mg at 05/05/21 2217    multivitamin-minerals-folic acid-lycopene-lutein (CERTAVITE SENIOR) tablet, 1 Tablet, Oral, Q24H, Cosner, Adam, PA-C, 1 Tablet at 05/04/21 1311    NS flush syringe, 10-30 mL, Intracatheter, Q8HRS, Nicola Police, MD, 10 mL at 05/05/21 2200    NS flush syringe, 20-30 mL, Intracatheter, Q1 MIN PRN, Nicola Police, MD    octreotide (SANDOSTATIN) 100 mcg/mL injection, 100 mcg, Intravenous, 3x/day, Cosner, Adam, PA-C, 100 mcg at 05/06/21 0832    ondansetron (ZOFRAN) 2 mg/mL injection, 4 mg, Intravenous, Q6H PRN, Ranae Palms, DO, 4 mg at 05/06/21 0610    oxyCODONE (ROXICODONE) immediate release tablet, 2.5 mg, Oral, Q4H PRN, Loma Boston, Briana, DO, 2.5 mg at 05/05/21 1613    oxyCODONE (ROXICODONE) immediate release tablet, 5 mg, Oral, Q4H PRN, Sowers, Briana, DO, 5 mg at 05/06/21 0001    [COMPLETED] pantoprazole (PROTONIX) 80 mg in NS 100 mL (tot vol) IVPB, 80 mg, Intravenous, Now, Stopped at 04/15/21 0652 **AND** [COMPLETED]  pantoprazole (PROTONIX) injection, 40 mg, Intravenous, Q12H, 40 mg at 04/17/21 1730 **AND** pantoprazole (PROTONIX) delayed release tablet, 40 mg, Oral, Q12H, [name], MD, 40 mg at 05/06/21 0406    prochlorperazine (COMPAZINE) 5 mg/mL injection, 10 mg, Intravenous, Q6H PRN, Sowers, Briana, DO, 10 mg at 05/06/21 0406    [Held by provider] sennosides-docusate sodium (SENOKOT-S) 8.6-50mg per tablet, 1 Tablet, Oral, 2x/day, Cosner, Adam, PA-C, 1 Tablet at 04/26/21 2148    SSIP insulin R human (HUMULIN R) 100 units/mL injection, 0-12 Units, Subcutaneous, 4x/day PRN, 8 Units at 05/06/21 0631 **AND** POCT WHOLE BLOOD GLUCOSE, , , TID AC & HS, Sowers, Briana, DO    ALLERGIES:  No Known Allergies    REVIEW OF SYSTEMS:  Positive for nausea.   ROS: All systems reviewed and negative except for as above.     PHYSICAL EXAMINATION:  Vitals: BP (!) 148/92   Pulse 81   Temp 36.8 C (98.2 F)   Resp (!) 10   Ht 1.753 m (5' 9.02")   Wt 71.8 kg (158 lb 4.6 oz)   SpO2 97%   BMI 23.36 kg/m       General: Chronically ill appearing male in no acute distress.  HEENT: Head normocephalic, atraumatic. Conjunctiva clear. No proptosis or lid lag. Oral mucosa pink and moist.   Thyroid/Neck: No thyromegaly. Trachea midline.  Lungs: Normal respiratory effort.   Cardiac: Regular rate and rhythm.  Abdomen: Soft, non-tender, non-distended.   Extremities: No edema, erythema, warmth, or tenderness.  Skin: Warm and dry. No visible rashes.  Neuro: Alert and oriented x 3. Speech fluent. No tremors.  Psychiatric: Normal mood and affect.    DATA REVIEWED:  I have reviewed previous labs, tests, imaging, and notes.    Lab Results   Component Value Date    WBC 14.8 (H) 05/06/2021    RBC 2.77 (L) 05/06/2021    HGB 7.6 (L) 05/06/2021    HCT 24.7 (L) 05/06/2021    MCV 89.2 05/06/2021    MCH 27.4 05/06/2021    MCHC 30.8 (L) 05/06/2021     Lab Results   Component Value Date    SODIUM 132 (L) 05/06/2021    POTASSIUM 3.4 (L) 05/06/2021    CO2 28  05/06/2021    BUN 15 05/06/2021    CREATININE 0.62 (L) 05/06/2021    CALCIUM 8.6 (L) 05/06/2021    ALBUMIN 1.7 (L) 05/03/2021    ALKPHOS 94 05/03/2021    ALT 12 05/03/2021    AST 17 05/03/2021    ANIONGAP 5 05/06/2021     Lab Results   Component Value Date    CHOLESTEROL 212 (H) 12/21/2020    HDLCHOL 44 12/21/2020    LDLCHOL 96 12/21/2020    LDLCHOLDIR 89 03/24/2020    TRIG 165 (H) 05/03/2021      Lab Results   Component Value Date    HA1C 9.3 (H) 03/17/2021     No results found for: TSH, T3UP, MTUP1, Abelino Derrick, THYSTIMUNQNT, THYSTIMMUNOG    Recent Labs     05/04/21  1201 05/04/21  1726 05/04/21  2115 05/05/21  0639 05/05/21  1253 05/05/21  1655 05/05/21  2056 05/06/21  0614   GLUIP 155* 161* 140* 189* 197* 226* 100 224*       ASSESSMENT & RECOMMENDATIONS:    ISAIAHS CHANCY is a 76 y.o. 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 Endocrinology is consulted for management of diabetes.    Post Pancreatectomy Diabetes Mellitus  - Most recent HbA1c on 03/17/2021 was 9.3%.  - Home regimen: Lantus 11 units nightly, Novolog 4 units TID AC.   - Current inpatient regimen: Humulin R aggressive sliding scale 4x/day PRN.   - Current diet: MNT PROTOCOL FOR DIETITIAN  adult custom parenteral nutrition  DIET NPO - SPECIFIC DATE & TIME EXCEPT ALL MEDS WITH SIPS OF WATER  adult custom parenteral nutrition   - TPN currently running at 80 mL/hr today that will increase to 90 mL/hr tomorrow.   - Recommend to start Lantus at 10 units nightly and start scheduled Humulin R at 6 units Q6 hours and change Humulin R sliding scale to conservative Q6 hours PRN.    I independently of the faculty provider spent a total of 44 minutes in direct/indirect care of this patient including initial evaluation, review of laboratory, radiology, diagnostic studies, review  of medical record, order entry and coordination of care.    Thank you for this consult. The Endocrinology Consult service will continue to follow at this time. Please call if questions arise.    Primary service notified of the above recommendations.    Hubert Azure, APRN,FNP-BC 05/06/2021, 10:03  Endocrinology, Diabetes & Metabolism  [facility]    I did not see this patient. This patient was seen by the NP/PA independently. I was available on site if the NP/PA had questions regarding this patient's care. This note does not indicate that I personally  evaluated the patient or the patient's chart.    Keith Rake, MD, MPH 05/06/2021, 12:29

## 2021-05-06 NOTE — Nurses Notes (Addendum)
2200- Paged MD. Pt stated to writer the following statement, "It's like I have a rubber band effect and I see medication floating in front of me." FBS- 272, as per MD to provide insulin coverage. PERRLA, denies numbness and tingling in all extremities, including face. Able to serial numeral count.     Medicated patient for nausea and paged MD for sleep aid as per request of patient.    Paged on call to clarify if the Q4 FS results should be covered as per sliding scale. Awaiting for clarification.

## 2021-05-06 NOTE — Nurses Notes (Signed)
0150- Stopped heparin gtt as per order.

## 2021-05-06 NOTE — Progress Notes (Signed)
[facility]  Surgical Oncology  Progress Note      [name], [name], 76 y.o. male  Date of Birth:  04/30/1945  Date of Admission:  04/03/2021  Date of service: 05/06/2021    Assessment:  This is a 76 y.o. 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 He underwent massive transfusion protocol and ex lap on 04/03/21 at an outside facility with ligation of a bleeding vessel in the liver bed. Patient was subsequently transferred to [facility] postoperaively, intubated. Patient was subsequently extubated later that day on 3/4. 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      Plan/Recommendations:  - Follow up new blood cultures  - Continue dapto/merrem/fluconazole  - Diet: CLD + TPN  - Peripancreatic fluid collections   - Continue octreotide   - GI scope on 3/23 unable to stent PD duct, patient with pancreatic divisim              - IR consulted; RUQ drain placement and empiric embolization 3/10              - Blood cultures 3/9: + pseudomonas    - Cultures form R JP: Pseudomonas and enterococcus              - Repeat Blood cultures 3/12 negative   - ID consulted; continue Daptomycin and meropenum (stop date 4/7)   - CT scan this am to evaluate fluid collections - collections present but draining  - PE   - s/p IVC filter placement 3/17   - Aggressive pulm toilet, supp off O2   - Continue heparin gtt  - IVF: TPN 53m/hr  - 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 Repeat blood cultures ordered, CXR and CT unremarkable for obvious source. Transferred to SDU.     Objective  Filed Vitals:    05/05/21 2003 05/06/21  0009 05/06/21 0101 05/06/21 0200   BP:    (!) 143/88   Pulse:    92   Resp:   16 19   Temp: 37 C (98.6 F) 36.7 C (98.1 F) 36.9 C (98.4 F)    SpO2:    97%     Physical Exam:   GEN:  AOx4, resting in bed, no acute distress  PULM: Normal respiratory effort. Equal/symmetric chest rise.  CV:  Pink, well perfused, RRR  ABD:   Abdomen soft, nontender, nondistended. Lower midline incision with feculent drainage present with ostomy pouch on top.  Pigtail catheter present with tan murky output.  MS: Atraumatic. Moves all extremities.  NEURO:   Alert and oriented to person, place and time.  Cranial nerves grossly intact.    Integumentary:  Pink, warm, and dry  PSYCHOSOCIAL: Pleasant.  Normal affect.     Labs  Results for orders placed or performed during the hospital encounter of 04/03/21 (from the past 24 hour(s))   PTT (PARTIAL THROMBOPLASTIN TIME) - ONCE   Result Value Ref Range    APTT 79.8 (H) 24.2 - 37.5 seconds    Narrative    Therapeutic range  for unfractionated heparin is 60-100 seconds.   PTT (PARTIAL THROMBOPLASTIN TIME) - ONCE   Result Value Ref Range    APTT 74.5 (H) 24.2 - 37.5 seconds    Narrative    Therapeutic range for unfractionated heparin is 60-100 seconds.   URINALYSIS, MACROSCOPIC AND MICROSCOPIC W/CULTURE REFLEX    Specimen: Urine, Clean Catch    Narrative    The following orders were created for panel order URINALYSIS, MACROSCOPIC AND MICROSCOPIC W/CULTURE REFLEX.  Procedure                               Abnormality         Status                     ---------                               -----------         ------                     URINALYSIS, MACROSCOPIC[[id number]]      Abnormal            Final result               URINALYSIS, MICROSCOPIC[[id number]]      Normal              Final result                 Please view results for these tests on the individual orders.   CBC   Result Value Ref Range    WBC 17.3 (H) 3.7 - 11.0 x10^3/uL    RBC 3.01 (L) 4.50 - 6.10 x10^6/uL    HGB 8.5 (L) 13.4 -  17.5 g/dL    HCT 27.1 (L) 38.9 - 52.0 %    MCV 90.0 78.0 - 100.0 fL    MCH 28.2 26.0 - 32.0 pg    MCHC 31.4 31.0 - 35.5 g/dL    RDW-CV 17.4 (H) 11.5 - 15.5 %    PLATELETS 524 (H) 150 - 400 x10^3/uL    MPV 11.9 8.7 - 12.5 fL    Narrative    White Blood CountResults reviewed .   URINALYSIS, MACROSCOPIC   Result Value Ref Range    SPECIFIC GRAVITY 1.021 1.005 - 1.030    GLUCOSE 200 (A) Negative mg/dL    PROTEIN Negative Negative mg/dL    BILIRUBIN Negative Negative mg/dL    UROBILINOGEN Negative Negative mg/dL    PH 7.0 5.0 - 8.0    BLOOD Moderate (A) Negative mg/dL    KETONES Negative Negative mg/dL    NITRITE Negative Negative    LEUKOCYTES Negative Negative WBCs/uL    APPEARANCE Clear Clear    COLOR Normal (Yellow) Normal (Yellow)    URINALYSIS COMMENTS stop    URINALYSIS, MICROSCOPIC   Result Value Ref Range    WBCS <1.0 <4.0 /hpf    RBCS <1.0 <6.0 /hpf    BACTERIA Occasional or less Occasional or less /hpf    SQUAMOUS EPITHELIAL CELLS Occasional or less Occasional or less /lpf    MUCOUS Light Light /lpf   PTT (PARTIAL THROMBOPLASTIN TIME) - ONCE   Result Value Ref Range    APTT 95.7 (H) 24.2 - 37.5 seconds    Narrative    Therapeutic range for  unfractionated heparin is 60-100 seconds.   VENOUS BLOOD GAS WITH LACTATE REFLEX   Result Value Ref Range    %FIO2 (VENOUS) 36.0 %    PH (VENOUS) 7.46 (H) 7.31 - 7.41    PCO2 (VENOUS) 42 41 - 51 mm/Hg    PO2 (VENOUS) 58 (H) 35 - 50 mm/Hg    BASE EXCESS 5.4 (H) -3.0 - 3.0 mmol/L    BICARBONATE (VENOUS) 29.0 (H) 22.0 - 26.0 mmol/L    LACTATE 1.8 (H) 0.0 - 1.3 mmol/L   CBC/DIFF    Narrative    The following orders were created for panel order CBC/DIFF.  Procedure                               Abnormality         Status                     ---------                               -----------         ------                     CBC WITH EZMO[[id number]]                Abnormal            Final result               MANUAL DIFF AND MORPHOLO.Marland KitchenMarland Kitchen[[id number]]  Abnormal            Final  result                 Please view results for these tests on the individual orders.   BASIC METABOLIC PANEL   Result Value Ref Range    SODIUM 132 (L) 136 - 145 mmol/L    POTASSIUM 3.4 (L) 3.5 - 5.1 mmol/L    CHLORIDE 99 96 - 111 mmol/L    CO2 TOTAL 28 23 - 31 mmol/L    ANION GAP 5 4 - 13 mmol/L    CALCIUM 8.6 (L) 8.8 - 10.2 mg/dL    GLUCOSE 207 (H) 65 - 125 mg/dL    BUN 15 8 - 25 mg/dL    CREATININE 0.62 (L) 0.75 - 1.35 mg/dL    BUN/CREA RATIO 24 (H) 6 - 22    ESTIMATED GFR >90 >=60 mL/min/BSA   PHOSPHORUS   Result Value Ref Range    PHOSPHORUS 2.4 2.3 - 4.0 mg/dL   MAGNESIUM   Result Value Ref Range    MAGNESIUM 1.8 1.8 - 2.6 mg/dL   CBC WITH DIFF   Result Value Ref Range    WBC 14.8 (H) 3.7 - 11.0 x10^3/uL    RBC 2.77 (L) 4.50 - 6.10 x10^6/uL    HGB 7.6 (L) 13.4 - 17.5 g/dL    HCT 24.7 (L) 38.9 - 52.0 %    MCV 89.2 78.0 - 100.0 fL    MCH 27.4 26.0 - 32.0 pg    MCHC 30.8 (L) 31.0 - 35.5 g/dL    RDW-CV 17.2 (H) 11.5 - 15.5 %    PLATELETS 401 (H) 150 - 400 x10^3/uL    MPV 12.4 8.7 - 12.5 fL   MANUAL DIFF AND MORPHOLOGY-SYSMEX   Result Value Ref Range    NEUTROPHIL %  52 %    LYMPHOCYTE %  40 %    MONOCYTE % 5 %    EOSINOPHIL % 1 %    BASOPHIL % 0 %    METAMYELOCYTE %  1 %    MYELOCYTE % 1 %    NEUTROPHIL # 7.70 1.50 - 7.70 x10^3/uL    LYMPHOCYTE # 5.92 (H) 1.00 - 4.80 x10^3/uL    MONOCYTE # 0.74 0.20 - 1.10 x10^3/uL    EOSINOPHIL # 0.15 <=0.50 x10^3/uL    BASOPHIL # <0.10 <=0.20 x10^3/uL    NRBC FROM MANUAL DIFF 1 per 100 WBC    SMUDGE CELLS Present (A) None    RBC MORPHOLOGY Normal RBC and PLT Morphology    POC BLOOD GLUCOSE (RESULTS)   Result Value Ref Range    GLUCOSE, POC 197 (H) 70 - 105 mg/dl   POC BLOOD GLUCOSE (RESULTS)   Result Value Ref Range    GLUCOSE, POC 226 (H) 70 - 105 mg/dl   POC BLOOD GLUCOSE (RESULTS)   Result Value Ref Range    GLUCOSE, POC 100 70 - 105 mg/dl   POC BLOOD GLUCOSE (RESULTS)   Result Value Ref Range    GLUCOSE, POC 224 (H) 70 - 105 mg/dl       I/O:  Date 05/05/21 0700 - 05/06/21  0659 05/06/21 0700 - 05/07/21 0659   Shift 0700-1459 1500-2259 2300-0659 24 Hour Total 0700-1459 1500-2259 2300-0659 24 Hour Total   INTAKE   P.O. 200 60  260         Oral 200 60  260       I.V.(mL/kg/hr) 287.8(6.76) 7209.47(0.9)  6283.66(2.94)         Heparin Volume 103.6 74  177.6         Volume Infused (adult custom parenteral nutrition) 490 2030  2520         Volume Infused (LR premix infusion)  134.25  134.25       IV Piggyback  70  70         Volume (DAPTOmycin (CUBICIN) 800 mg in NS 50 mL IVPB)  70  70       Shift Total(mL/kg) 793.6(11.05) 2368.25(32.98)  7654.65(03.54)       OUTPUT   Urine(mL/kg/hr) 1000(1.74) 1425(2.48) 850(1.48) 3275(1.9)         Urine (Voided) 1000 1425 850 3275       Drains 100 90 37.5 227.5         JP Drain Output Aeronautical engineer Drain Right;Upper Abdomen) 30 20 7.5 57.5         Drain Output (Drain (Miscellaneous) #74F pigtail catheter for RLQ fluid collection Lower;Right;Anterior Abdomen) 70 70 30 170         Drain Output (Drain (Miscellaneous) peds ostomy bag Lower;Medial Abdomen)  0 0 0       Shift Total(mL/kg) 6568(12.75) 1515(21.1) 887.5(12.36) V7216946.5(48.78)       Weight (kg) 71.8 71.8 71.8 71.8 71.8 71.8 71.8 71.8       Radiology  Results for orders placed or performed during the hospital encounter of 04/03/21 (from the past 72 hour(s))   CT ABDOMEN PELVIS W IV CONTRAST     Status: None    Narrative    JAIVON VANBEEK Leeper  Male, 76 years old.    CT ABDOMEN PELVIS W IV CONTRAST performed on 05/05/2021 5:49 AM.    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 Recent active extravasation along the surgical site requiring operative ligation bleeding vessels on 04/03/2021. 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    RADIATION DOSE: 361.16 mGy.cm    CONTRAST: 100 ml's of Isovue 370    TECHNIQUE: Axial images from the lung bases through the  pelvis were obtained after administration of IV contrast, with reformatted sagittal and coronal images.    COMPARISON: CT abdomen pelvis 04/25/2021    Chest: There is a trace left pleural effusion with bibasilar atelectasis.    Liver: The liver is normal in size. Subcentimeter well-defined hypodense lesions are noted in right hepatic dome and left lobe, too small to characterize but likely represent cysts. No suspicious lesion is evident. Minimal perihepatic organized fluid collection is stable in size, with a percutaneous pigtail catheter in place along the anterior right upper quadrant, and a persistent 2.4 x 2.9 x 3.0 cm collection is again noted in anterior subhepatic space.    Gallbladder: Normal in size, without evidence for acute cholecystitis. No intra or extrahepatic biliary dilation.    Spleen: The spleen is surgically absent. Organized water density fluid collection along the surgical bed measuring 11.4 x 8.1 cm is stable in size with a percutaneous catheter is seen in stable positioning.    Pancreas: Postsurgical changes from distal pancreatectomy are again noted, with stable size and appearance of an organized rim-enhancing fluid collection along the surgical site which measures 5.6 x 2.5 cm. The residual pancreatic head appears unremarkable. No ductal dilatation is present.    Adrenals: Unremarkable.    Kidneys/ureters/bladder: The kidneys are normal in size and symmetric in attenuation. There is no evidence for hydroureteronephrosis or nephroureterolithiasis. Multiple subcentimeter hypoattenuating lesions are noted bilaterally, which are too small to characterize but likely represent cysts. The bladder is distended, without focal wall thickening.    Stomach and bowel: The stomach is minimally distended, with redemonstration of wall thickening and adjacent inflammatory changes along the distal gastric body/antrum and adjacent to greater curvature, likely reactive secondary to adjacent fluid collections.  Postsurgical changes from sigmoidectomy and small bowel resection with reanastomosis are identified. Small and large bowel are nondilated, without evidence for obstruction. No focal wall thickening or pericolonic inflammatory changes are identified. There is a small rectal stool ball without evidence for stercoral colitis. Evaluation for enterocutaneous fistula is limited secondary to lack of enteric contrast, though mid transverse colonic loop is seen abutting the anterior abdominal wall midline as best seen on series 2 image 52, raising suspicion for fistula formation. Scattered diverticula are noted identified throughout the colon, without acute diverticulitis. Mild colonic stool burden is present with a small sized rectal stool ball.    Reproductive organs: The prostate is normal in size with brachytherapy seeds.    Peritoneal cavity: No intraperitoneal free air is identified. There are diffuse inflammatory changes predominantly within the upper abdomen, without free fluid.    Lymph nodes/vasculature: The abdominal aorta is normal in caliber with moderate atherosclerotic calcifications seen throughout the included vasculature. An IVC filter is noted just below the level of the renal veins. The superior mesenteric, hepatic and portal veins are patent. Scattered prominent mesenteric and portacaval lymph nodes are likely reactive in nature.    Bones/soft tissues: Midline anterior abdominal wall surgical changes are identified, with underlying air-containing fluid and inflammatory changes stable compared to prior exam. Inflammatory changes along the right inguinal region stable compared to prior exam and likely related to  vascular access, without organized fluid collection or hematoma. No suspicious osseous lesion or acute bony abnormality is evident.        Impression    1.Postsurgical changes from distal pancreatectomy and splenectomy with persistent fluid collections along the surgical beds, stable in size and  appearance with multiple percutaneous drainage catheters noted.  2.Midline abdominal wall incision with underlying fluid and inflammatory changes are also stable, with suggestion of an enterocutaneous fistula connecting the surgical incision within the mid transverse colon, though this is suboptimally evaluated due to lack of enteric contrast.  3.Trace left pleural effusion with bibasilar atelectasis.     XR AP MOBILE CHEST     Status: None    Narrative    OSEI ANGER Panzer  Male, 76 years old.    XR AP MOBILE CHEST performed on 05/05/2021 1:07 PM.    REASON FOR EXAM:  febrile workup    TECHNIQUE: 1 views/1 images submitted for interpretation.    COMPARISON:  April 24, 2021    FINDINGS:  Left subclavian PICC line with its tip overlying the SVC is noted. Small-to-moderate left effusion with adjacent compressive atelectasis. Small right effusion with atelectasis.. No lobar consolidation or pneumothorax. Cardiac size and central pulmonary vasculature within normal limits. Visualized osseous structures are unremarkable. Visualized osseous structures are unremarkable.      Impression    Small to moderate left and small right effusion. No lobar consolidation.       Current Medications:  Current Facility-Administered Medications   Medication Dose Route Frequency   . acetaminophen (TYLENOL) tablet  500 mg Oral Q4H PRN   . adult custom parenteral nutrition   Intravenous Continuous   . atorvastatin (LIPITOR) tablet  40 mg Oral Daily with Breakfast   . benzonatate (TESSALON) capsule  100 mg Oral Q8H PRN   . DAPTOmycin (CUBICIN) 800 mg in NS 50 mL IVPB  10 mg/kg (Adjusted) Intravenous Q24H   . [Held by provider] docusate sodium (COLACE) capsule  100 mg Oral 2x/day   . elvitegravir-cobicistat-emtricitrabine-tenofovir (GENVOYA) 150-150-200-10 mg per tablet  1 Tablet Oral Daily   . fluconazole (DIFLUCAN) tablet  400 mg Oral Daily   . gabapentin (NEURONTIN) capsule  300 mg Oral 3x/day   . heparin 25,000 units in NS 250 mL infusion   18 Units/kg/hr (Adjusted) Intravenous Continuous   . LR premix infusion   Intravenous Continuous   . meropenem (MERREM) 1 g in NS 100 mL IVPB  1 g Intravenous Q8H   . methocarbamol (ROBAXIN) tablet  500 mg Oral 4x/day   . multivitamin-minerals-folic acid-lycopene-lutein (CERTAVITE SENIOR) tablet  1 Tablet Oral Q24H   . NS flush syringe  10-30 mL Intracatheter Q8HRS   . NS flush syringe  20-30 mL Intracatheter Q1 MIN PRN   . octreotide (SANDOSTATIN) 100 mcg/mL injection  100 mcg Intravenous 3x/day   . ondansetron (ZOFRAN) 2 mg/mL injection  4 mg Intravenous Q6H PRN   . oxyCODONE (ROXICODONE) immediate release tablet  2.5 mg Oral Q4H PRN   . oxyCODONE (ROXICODONE) immediate release tablet  5 mg Oral Q4H PRN   . pantoprazole (PROTONIX) delayed release tablet  40 mg Oral Q12H   . prochlorperazine (COMPAZINE) 5 mg/mL injection  10 mg Intravenous Q6H PRN   . [Held by provider] sennosides-docusate sodium (SENOKOT-S) 8.6-'50mg'$  per tablet  1 Tablet Oral 2x/day   . SSIP insulin R human (HUMULIN R) 100 units/mL injection  0-12 Units Subcutaneous 4x/day PRN       Junious Dresser. Duanne Limerick, MD  General Surgery  Pager # [phone]

## 2021-05-06 NOTE — Anesthesia Preprocedure Evaluation (Addendum)
ANESTHESIA PRE-OP EVALUATION  [name]  Planned Procedure: ENDOSCOPIC U/S WITH CYST GASTROSTOMY (Mouth)  Review of Systems    PONV       patient summary reviewed  nursing notes reviewed        Pulmonary  negative pulmonary ROS,    Cardiovascular    Hypertension, ECG reviewed, 3/23 echo  Left Ventricle: Normal left ventricular size. Concentric remodeling. Ejection Fraction is 62.6 %. Left ventricular systolic function is  normal. No segmental/regional wall motion abnormalities identified. Left ventricular diastolic parameters are normal.  Right Ventricle: Normal right ventricular size. Normal right ventricular systolic function. RV systolic pressure could not be determined  due to incomplete tricuspid regurgitation envelope.  Left Atrium: The left atrium is normal in size.  Right Atrium: The right atrium is of normal size.  Mitral Valve: No evidence of mitral regurgitation present.  Tricuspid Valve: The tricuspid valve is not well visualized. There is mild tricuspid regurgitation.  Aortic Valve: Trileaflet aortic valve. No Aortic valve stenosis.  Pulmonic Valve: The pulmonic valve is not well visualized. Trace pulmonic valve regurgitation present.  IVC/Hepatic Veins: The inferior vena cava was not visualized.  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            Dental       Dentition intact             Pulmonary    Breath sounds clear to auscultation       Cardiovascular    Rhythm: regular  Rate: Normal       Other findings            Plan  ASA 3     Planned anesthesia type: general     general anesthesia with endotracheal tube intubation                  Intravenous  induction     Anesthesia issues/risks discussed are: Dental Injuries, Sore Throat, Cardiac Events/MI, PONV, Intraoperative Awareness/ Recall, Post-op Pain Management, Aspiration and Post-op Cognitive Dysfunction.  Anesthetic plan and risks discussed with patient  Signed consent obtained        Use of blood products discussed with patient who consented to blood products.     Patient's NPO status is appropriate for Anesthesia.           Plan discussed with CRNA.

## 2021-05-06 NOTE — Progress Notes (Signed)
BRIEF PROCEDURE NOTE (EUS CYSTGASTROSTOMY)    - 32 X 43 mm Walled off necrosis (WON) was seen in the BOP.   - Successful EUS guided cystogastrostomy with placement of LAMS.  - Necrosectomy performed with removal of large amount of necrotic material     RECS:     - ADAT   - No PPI to allow acid secretions to wash off the WON  - Avoid Antiplatelet / Anticoagulation x 48 hrs   - Repeat CT in 4 weeks   - EGD in 4-6 weeks for stent removal   Will follow   Call for questions     Lin Landsman, MD  Advanced Endoscopy Fellow, PGY 7

## 2021-05-07 ENCOUNTER — Other Ambulatory Visit (HOSPITAL_COMMUNITY): Payer: Self-pay

## 2021-05-07 DIAGNOSIS — I10 Essential (primary) hypertension: Secondary | ICD-10-CM

## 2021-05-07 DIAGNOSIS — B9561 Methicillin susceptible Staphylococcus aureus infection as the cause of diseases classified elsewhere: Secondary | ICD-10-CM

## 2021-05-07 LAB — CBC
HCT: 24.3 % — ABNORMAL LOW (ref 38.9–52.0)
HGB: 7.5 g/dL — ABNORMAL LOW (ref 13.4–17.5)
MCH: 28.1 pg (ref 26.0–32.0)
MCHC: 30.9 g/dL — ABNORMAL LOW (ref 31.0–35.5)
MCV: 91 fL (ref 78.0–100.0)
MPV: 11.6 fL (ref 8.7–12.5)
PLATELETS: 457 10*3/uL — ABNORMAL HIGH (ref 150–400)
RBC: 2.67 10*6/uL — ABNORMAL LOW (ref 4.50–6.10)
RDW-CV: 17.2 % — ABNORMAL HIGH (ref 11.5–15.5)
WBC: 13.8 10*3/uL — ABNORMAL HIGH (ref 3.7–11.0)

## 2021-05-07 LAB — BASIC METABOLIC PANEL
ANION GAP: 6 mmol/L (ref 4–13)
BUN/CREA RATIO: 30 — ABNORMAL HIGH (ref 6–22)
BUN: 20 mg/dL (ref 8–25)
CALCIUM: 8.3 mg/dL — ABNORMAL LOW (ref 8.8–10.2)
CHLORIDE: 102 mmol/L (ref 96–111)
CO2 TOTAL: 28 mmol/L (ref 23–31)
CREATININE: 0.66 mg/dL — ABNORMAL LOW (ref 0.75–1.35)
ESTIMATED GFR: >90 mL/min/BSA (ref >=60)
GLUCOSE: 214 mg/dL — ABNORMAL HIGH (ref 65–125)
POTASSIUM: 4.7 mmol/L (ref 3.5–5.1)
SODIUM: 136 mmol/L (ref 136–145)

## 2021-05-07 LAB — PHOSPHORUS: PHOSPHORUS: 2 mg/dL — ABNORMAL LOW (ref 2.3–4.0)

## 2021-05-07 LAB — MAGNESIUM: MAGNESIUM: 1.9 mg/dL (ref 1.8–2.6)

## 2021-05-07 LAB — POC BLOOD GLUCOSE (RESULTS)
GLUCOSE, POC: 215 mg/dl — ABNORMAL HIGH (ref 70–105)
GLUCOSE, POC: 250 mg/dl — ABNORMAL HIGH (ref 70–105)
GLUCOSE, POC: 245 mg/dl — ABNORMAL HIGH (ref 70–105)
GLUCOSE, POC: 251 mg/dl — ABNORMAL HIGH (ref 70–105)
GLUCOSE, POC: 238 mg/dl — ABNORMAL HIGH (ref 70–105)
GLUCOSE, POC: 210 mg/dl — ABNORMAL HIGH (ref 70–105)

## 2021-05-07 MED ORDER — HEPARIN (PORCINE) 5,000 UNIT/ML INJECTION SOLUTION
5000.0000 [IU] | Freq: Three times a day (TID) | INTRAMUSCULAR | Status: AC
Start: 2021-05-07 — End: 2021-05-08
  Administered 2021-05-07 – 2021-05-08 (×3): 5000 [IU] via SUBCUTANEOUS
  Filled 2021-05-07 (×3): qty 1

## 2021-05-07 MED ORDER — INSULIN U-100 REGULAR HUMAN 100 UNIT/ML INJECTION SOLUTION
8.0000 [IU] | Freq: Four times a day (QID) | INTRAMUSCULAR | Status: DC
Start: 2021-05-07 — End: 2021-05-08
  Administered 2021-05-07 – 2021-05-08 (×4): 8 [IU] via SUBCUTANEOUS

## 2021-05-07 MED ORDER — SODIUM PHOSPHATE 3 MMOL/ML INTRAVENOUS SOLUTION
30.0000 mmol | Freq: Once | INTRAVENOUS | Status: AC
Start: 2021-05-07 — End: 2021-05-07
  Administered 2021-05-07: 0 mmol via INTRAVENOUS
  Administered 2021-05-07: 30 mmol via INTRAVENOUS
  Filled 2021-05-07: qty 10

## 2021-05-07 MED ORDER — WATER FOR INJECTION, STERILE INTRAVENOUS SOLUTION
INTRAVENOUS | Status: AC
Start: 2021-05-07 — End: 2021-05-08
  Filled 2021-05-07: qty 1300

## 2021-05-07 MED ORDER — INSULIN GLARGINE-YFGN (U-100) 100 UNIT/ML SUBCUTANEOUS SOLUTION
14.0000 [IU] | Freq: Every evening | SUBCUTANEOUS | Status: DC
Start: 2021-05-07 — End: 2021-05-08
  Administered 2021-05-07: 14 [IU] via SUBCUTANEOUS
  Filled 2021-05-07: qty 14

## 2021-05-07 MED ORDER — INSULIN REGULAR HUMAN 100 UNIT/ML INJECTION SSIP
0.0000 [IU] | INJECTION | Freq: Four times a day (QID) | SUBCUTANEOUS | Status: DC | PRN
Start: 2021-05-07 — End: 2021-05-09
  Administered 2021-05-08 (×2): 6 [IU] via SUBCUTANEOUS
  Administered 2021-05-09: 3 [IU] via SUBCUTANEOUS
  Filled 2021-05-07: qty 300

## 2021-05-07 MED ORDER — MELATONIN 3 MG TABLET
6.0000 mg | ORAL_TABLET | Freq: Every evening | ORAL | Status: DC
Start: 2021-05-07 — End: 2021-05-28
  Administered 2021-05-07 – 2021-05-12 (×7): 6 mg via ORAL
  Administered 2021-05-13: 0 mg via ORAL
  Administered 2021-05-14 – 2021-05-27 (×14): 6 mg via ORAL
  Filled 2021-05-07 (×24): qty 2

## 2021-05-07 MED ORDER — INSULIN REGULAR HUMAN 100 UNIT/ML INJECTION SSIP
0.0000 [IU] | INJECTION | Freq: Four times a day (QID) | SUBCUTANEOUS | Status: DC | PRN
Start: 2021-05-07 — End: 2021-05-07

## 2021-05-07 NOTE — Consults (Signed)
Department of Endocrinology  Consult    Name/MRN: [name], [name]  Age/DOB: 76 y.o., [birth year]    Date of service:05/07/2021  Reason for Consult: Diabetes management   Chief Complaint:  No chief complaint on file.      HISTORY OF PRESENTING ILLNESS:  [name] is a 76 y.o. 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 Endocrinology is consulted for management of diabetes.    SUBJECTIVE:  Patient was seen and examined at bedside. Resting in bed. Wife at bedside. Reports he is nauseas and has been having a lot of dry heaves. He is getting TPN but also taking in clear liquids.     PAST MEDICAL HISTORY:  Past Medical History:   Diagnosis Date    Cancer (CMS [facility])     prostate    Deep vein thrombosis (DVT) (CMS HCC)     right leg    Esophageal reflux     H/O hearing loss     High cholesterol     History of kidney disease     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    HTN (hypertension)     Human immunodeficiency virus (HIV) disease (CMS HCC)     Hyperlipidemia     "borderline"    Hypothyroidism     MRSA (methicillin resistant staph aureus) culture positive 01/02/2021    MRSA left groin abscess 01/03/21    MRSA (methicillin resistant staph aureus) culture positive 01/02/2021    MRSA blood 01/02/21    Pulmonary embolism (CMS HCC) 03/31/2021    Thyroid disorder     Wears glasses      SURGICAL HISTORY:   Past Surgical History:   Procedure Laterality Date    COLON SURGERY      per patient had 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 Darryl Nestle CERVICAL SPINE SURGERY  2001    HX COLOSTOMY REVERSAL      HX HERNIA REPAIR      KNEE SURGERY Bilateral     WRIST SURGERY Right       HOME MEDICATIONS:  Outpatient Medications Marked as Taking for the 04/03/21 encounter [facility] Encounter)   Medication Sig    acetaminophen (TYLENOL) 500 mg Oral Tablet Take 2 Tablets (1,000 mg total) by mouth Every 4 hours as needed for Pain    amLODIPine (NORVASC) 5 mg Oral Tablet Take 1 Tablet (5 mg total) by mouth Once a day    [EXPIRED] apixaban (ELIQUIS) 5 mg Oral Tablet Take 2 Tablets (10 mg total) by mouth Twice daily for 14 doses    atorvastatin (LIPITOR) 80 mg Oral Tablet Take 0.5 Tablets (40 mg total) by mouth Every morning with breakfast    BIOTIN ORAL Take 1 Tablet by mouth Once a day    Blood Sugar Diagnostic (ACCU-CHEK GUIDE TEST STRIPS) Strip 1 Strip Four times a day - before meals and bedtime    cetirizine (ZYRTEC) 10 mg Oral Tablet Take 1 Tablet (10 mg total) by mouth Once a day    cyanocobalamin (VITAMIN B 12) 1,000 mcg Oral Tablet Take 1 Tablet (1,000 mcg total)  by mouth Every morning    elviteg-cob-emtri-tenof ALAFEN (GENVOYA) 150-150-200-10 mg Oral Tablet Take 1 Tablet by mouth Once a day    flash glucose scanning reader (FREESTYLE LIBRE 2 READER) Does not apply Misc Use as directed with FreeStyle Libre 2 sensors    flash glucose sensor (FREESTYLE LIBRE 2 SENSOR) Does not apply Kit To check blood glucose continuously. Change every 14 days    gabapentin (NEURONTIN) 300 mg Oral Capsule Take 1 Capsule (300 mg total) by mouth Three times a day    insulin aspart U-100 (NOVOLOG) 100 unit/mL (3 mL) Subcutaneous Insulin Pen Inject 4 Units under the skin Three times a day With meals    insulin glargine (LANTUS SOLOSTAR U-100 INSULIN) 100 unit/mL Subcutaneous Insulin Pen Inject 15 units under the skin daily    insulin syr/ndl U100 half mark 0.3 mL 31 gauge x 5/16" Syringe Use a new syringe for each injection.    lancets (FREESTYLE LANCETS) 28 gauge Misc Use to test blood sugar 4 times a day - before meals and bedtime    levothyroxine (SYNTHROID) 88 mcg Oral Tablet Take 1 Tablet (88 mcg total) by mouth  Every morning    melatonin 5 mg Oral Tablet Take 1 Tablet (5 mg total) by mouth Every night    MULTIVITAMIN ORAL Take 1 Tablet by mouth Once a day    pantoprazole (PROTONIX) 40 mg Oral Tablet, Delayed Release (E.C.) Take 1 Tablet (40 mg total) by mouth Twice daily 30 minutes before a meal    Pen Needle, Disposable, (BD NANO 2ND GEN PEN NEEDLE) 32 gauge x 5/32" Needle Use a new pen needle for each injection.    POTASSIUM ORAL Take 1 Tablet by mouth Once a day     FAMILY HISTORY:  Family Medical History:       Problem Relation (Age of Onset)    Cancer Mother, Father, Other    Heart Attack Mother    High Cholesterol Other    Stroke Other          SOCIAL HISTORY:  Social History     Tobacco Use    Smoking status: Never    Smokeless tobacco: Never   Vaping Use    Vaping Use: Never used   Substance Use Topics    Alcohol use: Not Currently    Drug use: Never     INPATIENT MEDICATIONS:    Current Facility-Administered Medications:     acetaminophen (TYLENOL) tablet, 500 mg, Oral, Q4H PRN, Croley, Mallory, MD, 500 mg at 05/05/21 1306    adult custom parenteral nutrition, , Intravenous, Continuous, Cosner, Adam, PA-C, Last Rate: 90 mL/hr at 05/07/21 9449, Solution Verified at 05/07/21 0722    atorvastatin (LIPITOR) tablet, 40 mg, Oral, Daily with Breakfast, Nicola Police, MD, 40 mg at 05/07/21 0805    benzonatate (TESSALON) capsule, 100 mg, Oral, Q8H PRN, [name], MD    D5W 250 mL flush bag, , Intravenous, Q15 Min PRN, Modena Jansky, DO    DAPTOmycin (CUBICIN) 800 mg in NS 50 mL IVPB, 10 mg/kg (Adjusted), Intravenous, Q24H, Cosner, Adam, PA-C, Stopped at 05/06/21 1450    [Held by provider] docusate sodium (COLACE) capsule, 100 mg, Oral, 2x/day, Cosner, Adam, PA-C, 100 mg at 04/26/21 6759    elvitegravir-cobicistat-emtricitrabine-tenofovir (GENVOYA) 150-150-200-10 mg per tablet, 1 Tablet, Oral, Daily, Monia Pouch, DO, 1 Tablet at 05/07/21 0601    fluconazole (DIFLUCAN) tablet, 400 mg, Oral, Daily,  Cosner, Adam, PA-C, 400 mg at 05/07/21 [phone]  gabapentin (NEURONTIN) capsule, 300 mg, Oral, 3x/day, McDonald, Joshua, DO, 300 mg at 05/07/21 0805    heparin 25,000 units in NS 250 mL infusion, 18 Units/kg/hr (Adjusted), Intravenous, Continuous, Sowers, Briana, DO, Stopped at 05/06/21 0151    insulin glargine-yfgn 100 units/mL injection, 10 Units, Subcutaneous, NIGHTLY, Hubert Azure, APRN,FNP-BC, 10 Units at 05/06/21 2202    insulin R human 100 units/mL injection, 6 Units, Subcutaneous, Q6H, Lovejoy, Kylene, APRN,FNP-BC, 6 Units at 05/07/21 0559    LR premix infusion, , Intravenous, Continuous, Mariea Clonts, MD, Last Rate: 45 mL/hr at 05/06/21 2028, New Bag at 05/06/21 2028    melatonin tablet, 6 mg, Oral, NIGHTLY, Croley, Mallory, MD, 6 mg at 05/07/21 0248    meropenem (MERREM) 1 g in NS 100 mL IVPB, 1 g, Intravenous, Q8H, Cosner, Adam, PA-C, Stopped at 05/07/21 0352    methocarbamol (ROBAXIN) tablet, 500 mg, Oral, 4x/day, Larna Daughters, MD, 500 mg at 05/07/21 4818    multivitamin-minerals-folic acid-lycopene-lutein (CERTAVITE SENIOR) tablet, 1 Tablet, Oral, Q24H, Cosner, Adam, PA-C, 1 Tablet at 05/04/21 1311    NS 250 mL flush bag, , Intravenous, Q15 Min PRN, Modena Jansky, DO    NS flush syringe, 10-30 mL, Intracatheter, Q8HRS, Nicola Police, MD, 10 mL at 05/06/21 2200    NS flush syringe, 20-30 mL, Intracatheter, Q1 MIN PRN, Nicola Police, MD    NS flush syringe, 2-6 mL, Intracatheter, Q8HRS, Modena Jansky, DO, 2 mL at 05/07/21 0600    NS flush syringe, 2-6 mL, Intracatheter, Q1 MIN PRN, Modena Jansky, DO    octreotide (SANDOSTATIN) 100 mcg/mL injection, 100 mcg, Intravenous, 3x/day, Cosner, Adam, PA-C, 100 mcg at 05/07/21 0806    ondansetron (ZOFRAN) 2 mg/mL injection, 4 mg, Intravenous, Q6H PRN, Ranae Palms, DO, 4 mg at 05/07/21 0532    oxyCODONE (ROXICODONE) immediate release tablet, 2.5 mg, Oral, Q4H PRN, Sowers, Briana, DO, 2.5 mg at 05/05/21 1613    oxyCODONE  (ROXICODONE) immediate release tablet, 5 mg, Oral, Q4H PRN, Sowers, Briana, DO, 5 mg at 05/07/21 0532    [COMPLETED] pantoprazole (PROTONIX) 80 mg in NS 100 mL (tot vol) IVPB, 80 mg, Intravenous, Now, Stopped at 04/15/21 0652 **AND** [COMPLETED] pantoprazole (PROTONIX) injection, 40 mg, Intravenous, Q12H, 40 mg at 04/17/21 1730 **AND** pantoprazole (PROTONIX) delayed release tablet, 40 mg, Oral, Q12H, [name], MD, 40 mg at 05/07/21 0554    prochlorperazine (COMPAZINE) 5 mg/mL injection, 10 mg, Intravenous, Q6H PRN, Sowers, Briana, DO, 10 mg at 05/07/21 0223    [Held by provider] sennosides-docusate sodium (SENOKOT-S) 8.6-50mg per tablet, 1 Tablet, Oral, 2x/day, Cosner, Adam, PA-C, 1 Tablet at 04/26/21 2148    SSIP insulin R human (HUMULIN R) 100 units/mL injection, 0-12 Units, Subcutaneous, Q6H PRN **AND** POCT WHOLE BLOOD GLUCOSE, , , Q4H, Croley, Mallory, MD    ALLERGIES:  No Known Allergies    REVIEW OF SYSTEMS:  Positive for nausea.   ROS: All systems reviewed and negative except for as above.     PHYSICAL EXAMINATION:  Vitals: BP 125/69   Pulse 82   Temp 36.5 C (97.7 F)   Resp 18   Ht 1.753 m (5' 9.02")   Wt 71.8 kg (158 lb 4.6 oz)   SpO2 94%   BMI 23.36 kg/m       General: Chronically ill appearing male in no acute distress.  HEENT: Head normocephalic, atraumatic. Conjunctiva clear. No proptosis or lid lag. Oral mucosa pink and moist.   Thyroid/Neck: No thyromegaly. Trachea midline.  Lungs: Normal respiratory effort.   Cardiac: Regular rate and rhythm.  Abdomen: Soft, non-tender, non-distended.   Extremities: No edema, erythema, warmth, or tenderness.  Skin: Warm and dry. No visible rashes.  Neuro: Alert and oriented x 3. Speech fluent. No tremors.   Psychiatric: Normal mood and affect.    DATA REVIEWED:  I have reviewed previous labs, tests, imaging, and notes.    Lab Results   Component Value Date    WBC 13.8 (H) 05/07/2021    RBC 2.67 (L) 05/07/2021    HGB 7.5 (L) 05/07/2021    HCT 24.3  (L) 05/07/2021    MCV 91.0 05/07/2021    MCH 28.1 05/07/2021    MCHC 30.9 (L) 05/07/2021     Lab Results   Component Value Date    SODIUM 136 05/07/2021    POTASSIUM 4.7 05/07/2021    CO2 28 05/07/2021    BUN 20 05/07/2021    CREATININE 0.66 (L) 05/07/2021    CALCIUM 8.3 (L) 05/07/2021    ALBUMIN 1.7 (L) 05/03/2021    ALKPHOS 94 05/03/2021    ALT 12 05/03/2021    AST 17 05/03/2021    ANIONGAP 6 05/07/2021     Lab Results   Component Value Date    CHOLESTEROL 212 (H) 12/21/2020    HDLCHOL 44 12/21/2020    LDLCHOL 96 12/21/2020    LDLCHOLDIR 89 03/24/2020    TRIG 165 (H) 05/03/2021      Lab Results   Component Value Date    HA1C 9.3 (H) 03/17/2021     No results found for: TSH, T3UP, MTUP1, FREET4, T3, THYSTIMUNQNT, THYSTIMMUNOG    Recent Labs     05/05/21  1253 05/05/21  1655 05/05/21  2056 05/06/21  0614 05/06/21  1130 05/06/21  1807 05/06/21  2154 05/06/21  2335 05/07/21  0351 05/07/21  0556   GLUIP 197* 226* 100 224* 204* 271* 272* 278* 215* 250*       ASSESSMENT & RECOMMENDATIONS:    FALLON HOWERTER is a 76 y.o. 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 Endocrinology is consulted for management of diabetes.    Post Pancreatectomy Diabetes Mellitus  - Most recent HbA1c on 03/17/2021 was 9.3%.  - Home regimen: Lantus 11 units nightly, Novolog 4 units TID AC.   - Current inpatient regimen: Lantus 10 units nightly, Humulin R 6 units Q6 hours, and Humulin R moderate sliding scale 4x/day PRN.   - Current diet: MNT PROTOCOL FOR DIETITIAN  adult custom parenteral nutrition  DIET CLEAR LIQUID - LOW SUGAR   - TPN currently running at 90 mL/hr.   - Recommend to increase Lantus to 14 units nightly starting tonight and increase Humulin R to 8 units Q6 hours.    I independently of the faculty provider spent a total of 36 minutes in  direct/indirect care of this patient including initial evaluation, review of laboratory, radiology, diagnostic studies, review of medical record, order entry and coordination of care.    Thank you for this consult. The Endocrinology Consult service will continue to follow at this time. Please call if questions arise.    Primary service notified of the above recommendations.    Hubert Azure, APRN,FNP-BC 05/07/2021, 08:58  Endocrinology, Diabetes & Metabolism  [facility]    I did not see  this patient. This patient was seen by the NP/PA independently. I was available on site if the NP/PA had questions regarding this patient's care. This note does not indicate that I personally evaluated the patient or the patient's chart.    Keith Rake, MD, MPH 05/07/2021, 13:36

## 2021-05-07 NOTE — Care Plan (Signed)
Medical Nutrition Therapy  Nutrition Support Update       05/07/2021       Current Estimated Needs:   Energy Calorie Requirements: 2100-2300 cal (28-30 cal/75.9 kg)  Protein Requirements (gms/day): 88-110 g prot (1.2-1.5 g/73.7 kg)   Fluid Requirements: 2100-2300 ml (28-30 ml/75.9 k)       Current Nutrition Support: low sugar CL diet plus TPN:   Rate: 90 ml/hr  2100 kcal /day = 28 kcal/kg  130 grams Protein = 1.8 grams/ kg  845 Dextrose kcals, GIR 2.3  735 IL kcals or 1 grams Fat/kg      Comments: increased protein and lipids in TPN and decreased dextrose yesterday due to elevated blood sugars which continue to be high, endocrinology consulted and following      Latest Reference Range & Units 05/06/21 21:54 05/06/21 23:35 05/07/21 03:51 05/07/21 05:56   GLUCOSE, POC 70 - 105 mg/dl 272 (H) 278 (H) 215 (H) 250 (H)   (H): Data is abnormally high    New Recommendations:   Continue SF CL diet and advance to 2200 ADA when medically able    Continue current TPN at this time (low dextrose GIR 2.3 and high in protein and lipids) in an attempt to better control blood sugars    Pt refusing oral multivitamin, may need to put in TPN (twice/week due to IV shortage)    Continue to Monitor potassium, magnesium and phosphorus daily along with blood sugars and BMP and electrolytes can be adjusted in TPN as needed    Monitor LFTs and triglycerides weekly.   Monitor weekly weights.   Will continue to follow.         Dierdre Searles, RD, LD, [place] 05/07/2021, 07:54  Pager [phone]

## 2021-05-07 NOTE — Care Management Notes (Signed)
[facility]  Care Management Note    Patient Name: [name]  Date of Birth: 11/16/1945  Sex: male  Date/Time of Admission: 04/03/2021  2:01 PM  Room/Bed: 973/A  Payor: MEDICARE / Plan: MEDICARE PART A AND B / Product Type: Medicare /    LOS: 34 days   Primary Care Providers:  [facility] (General)    Admitting Diagnosis:  Intra abdominal hemorrhage [R58]    Assessment:      05/07/21 1054   Assessment Details   Assessment Type Continued Assessment   Date of Care Management Update 05/07/21   Date of Next DCP Update 05/10/21   Care Management Plan   Discharge Planning Status plan in progress   Projected Discharge Date 05/10/21         Discharge Plan:  Acute Rehab Placement/Return (not psych) (code 86)    [place] received call from [facility], able to provide patient private room at [facility] d/t patient concerns with privacy.  Initial assessment completed by [facility] liasion.  [place] left VM with MSW Duncan [phone] to initiate prior auth for [facility], [facility] information[facility], awaiting return call.  [facility] unable to accept patient over weekend d/t patient's requirement for TPN.  [facility] anticipates d/c Monday 05/10/21.  Patient will require EMS transport provided by [place].  CCC anticipates d/c dispo acute rehab.    The patient will continue to be evaluated for developing discharge needs.     Case Manager: Kerry Dory, [place]  Phone: [phone]

## 2021-05-07 NOTE — Care Plan (Signed)
[facility]  Occupational Therapy Progress Note    Patient Name: [name]  Date of Birth: 1945/06/06  Height:  175.3 cm (5' 9.02")  Weight:  71.8 kg (158 lb 4.6 oz)  Room/Bed: 973/A  Payor: [place] [employer] / Plan: [place] VAC[employer]/OPTUM / Product Type: Managed Care /     Assessment:    [name] performed well during OT intervention this date. Pt is demonstrating improvements regarding functional activity tolerance, evidenced via ability to participate in functional bed mobility, STS transfers, and ambulation of greater than household distance with use of FWW, CGA-min A provided for balance, steadying and close chair follow for safety. Continues to be limited by decreased strength, dizziness with positional changes, balance deficits, and decreased activity tolerance. Completed seated UB grooming ADLs with setup A. From OT perspective, continue to recommend acute rehab placement upon d/c once medically appropriate to maximize functional recovery and independence prior to returning home. OT will continue to follow.      Discharge Needs:   Equipment Recommendation: to be determined    Discharge Disposition:  inpatient rehabilitation facility     JUSTIFICATION OF DISCHARGE RECOMMENDATION   Based on current diagnosis, functional performance prior to admission, and current functional performance, this patient requires continued OT services in inpatient rehabilitation facility in order to achieve significant functional improvements.    Plan:   Continue to follow patient according to established plan of care.  The risks/benefits of therapy have been discussed with the patient/caregiver and he/she is in agreement with the established plan of care.     Subjective & Objective:      05/07/21 1333   Therapist Pager   OT Assigned/ Pager # Janiaya Ryser [phone]   Rehab Session   Document Type therapy progress note (daily note)   Total OT Minutes: 17   Patient Effort good   Symptoms  Noted During/After Treatment fatigue;dizziness   General Information   Patient Profile Reviewed yes   Onset of Illness/Injury or Date of Surgery 04/03/21   Pertinent History of Current Functional Problem This is a 76 y.o. 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 He underwent massive transfusion protocol and ex lap on 04/03/21 at an outside facility with ligation of a bleeding vessel in the liver bed. Patient was subsequently transferred to [facility] postoperaively, intubated. Patient was subsequently extubated later that day on 3/4. 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   Medical Lines PIV Line;PICC Line;Peripheral Drain;TPN   Respiratory Status room air   Existing Precautions/Restrictions full code;fall precautions;contact isolation   Pre Treatment Status   Pre Treatment Patient Status Patient supine in bed;Call light within reach;Telephone within reach;Nurse approved session   Support Present Pre Treatment  Family present   Communication Pre Treatment  Nurse   Mutuality/Individual Preferences   Individualized Care Needs OOB with FWW and assist x1-2   Patient-Specific Goals (Include Timeframe) To get some rest   Plan of Care Reviewed With patient   Vital Signs   Pre-Treatment Heart Rate (beats/min) 60   Post-treatment Heart Rate (beats/min) 94   Pre SpO2 (%) 92   O2 Delivery Pre Treatment room air   Post SpO2 (%) 91   O2 Delivery Post Treatment room air   Pain Assessment   Pretreatment Pain Rating 0/10 - no  pain   Posttreatment Pain Rating 0/10 - no pain   Coping/Psychosocial   Observed Emotional State calm;cooperative   Verbalized Emotional State acceptance   Coping/Psychosocial Response Interventions   Plan Of Care Reviewed With patient;spouse   Cognitive Assessment/Interventions   Behavior/Mood Observations alert;cooperative    Attention WNL/WFL   Follows Commands follows multi-step commands   Mobility Assessment/Training   Mobility Comment Pt ambulated functional mobility distance of ~100 feet with use of FWW, CGA provided for balance, steadying and close chair follow for safety.   Bed Mobility Assessment/Treatment   Supine-Sit Independence minimum assist (75% patient effort)   Sit to Supine, Independence not tested   Impairments balance impaired;endurance;flexibility decreased;postural control impaired;strength decreased   Comment Dizziness with positional change that improved with seated rest.   Transfer Assessment/Treatment   Sit-Stand Independence minimum assist (75% patient effort);2 person assist required   Stand-Sit Independence minimum assist (75% patient effort);verbal cues required   Sit-Stand-Sit, Assist Device walker, front wheeled   Transfer Safety Issues balance decreased during turns;step length decreased;weight-shifting ability decreased   Transfer Impairments balance impaired;flexibility decreased;postural control impaired;strength decreased   ADL Assessment/Intervention   ADL Comments seated grooming   Grooming Assessment/Training   Position supported sitting   Independence Level set up required;supervision required   Impairments activity tolerance impaired;balance impaired;strength decreased   Motor Skills/Interventions   Additional Documentation Functional Endurance (Group)   Balance Skill Training   Comment with use of FWW in standing   Sitting Balance: Static fair + balance   Sitting, Dynamic (Balance) fair + balance   Sit-to-Stand Balance fair balance   Standing Balance: Static fair balance   Standing Balance: Dynamic fair - balance   Systems Impairment Contributing to Balance Disturbance musculoskeletal   Functional Endurance Training   Comment, Functional Endurance improving   Post Treatment Status   Post Treatment Patient Status Patient sitting in bedside chair or w/c;Call light within reach;Telephone within  reach   Support Present Post Treatment  Family present   Communication Post Treatement Nurse   Clinical Impression   Functional Level at Time of Session Mr. Eisemann performed well during OT intervention this date. Pt is demonstrating improvements regarding functional activity tolerance, evidenced via ability to participate in functional bed mobility, STS transfers, and ambulation of greater than household distance with use of FWW, CGA-min A provided for balance, steadying and close chair follow for safety. Continues to be limited by decreased strength, dizziness with positional changes, balance deficits, and decreased activity tolerance. Completed seated UB grooming ADLs with setup A. From OT perspective, continue to recommend acute rehab placement upon d/c once medically appropriate to maximize functional recovery and independence prior to returning home. OT will continue to follow.   Anticipated Equipment Needs at Discharge to be determined   Anticipated Discharge Disposition inpatient rehabilitation facility   Highest level of Mobility score   Exercise/Activity Level Performed 7- Walked 25 feet or more         Therapist:   Kristiane Morsch, MOT, OTR/L  Pager #: [phone]

## 2021-05-07 NOTE — Consults (Signed)
Gastroenterology and Hepatology  Consult Follow Up Note      Gilberto, Stanforth, 76 y.o. male  Date of Admission:  04/03/2021  Date of service: 05/07/2021  Date of Birth:  [birth year]    Assessment/Recommendations:  RIKKI SMESTAD is a 76 y.o. male with history of HIV infection, hypertension, hyperlipidemia. 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 He underwent ex lap on 04/03/21 at an outside facility with ligation of a bleeding vessel in the liver bed. IR placed RUQ drain and performed empiric embolization on 3/10 for peripancreatic fluid collections. On 3/11, patient developed a midline fistula draining stool. Imaging on 3/21 shows improvement in fluid collections but persistent fluid around splenectomy bed with surgical drain coursing through it. [facility] is consulted for endoscopic evaluation.      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   - Successful EUS guided cystogastrostomy with placement of LAMS.  - Necrosectomy performed with removal of large amount of necrotic material      Recommendations:  - EUS guided cyst gastrostomy with placement of lumen apposing metal stent and necrosectomy performed for wall of necrosis on 05/06/2021.    -recommend holding PPI to allow masses secretions to wash off walled-off necrosis.    -recommend  avoiding antiplatelets as anticoagulant 48 hours post procedure.    -recommend repeating CT abdomen pelvis in 4 weeks and EGD in 4-6 weeks.    -thank you for consulting GI.  Please page on-call GI fellow with questions.    Subjective:  Patient seen this a.m..  He endorses that his nausea is somewhat better compared to before.      Objective:  Physical Exam  Filed Vitals:    05/07/21 0400 05/07/21 0807 05/07/21 1240 05/07/21 1459   BP: 122/70 125/69 129/72    Pulse: 90 82 68    Resp: '18 18 18 16   '$ Temp:  36.5 C (97.7 F) 36.6 C (97.9 F) 36.4 C (97.5 F)   SpO2: 92% 94% 95%      General: No apparent distress.  Appears chronically ill.  Mouth: Mucous membranes moist  Neck:  Supple, trachea midline, no JVD  Lungs:  Non labored breathing, equal chest rising  Heart:  RRR  Abdomen: Soft, non-tender to palpation .  Ostomy pouch noted.  Extremities: No cyanosis, no edema  Neuro:  Grossly normal  Skin:  Warm and dry    Labs:  I have reviewed all today's pertinent lab values.     Imaging:  I have reviewed all today's pertinent imaging studies.     Harlon Flor, MD  PGY-4 Gastroenterology and Hepatology Fellow       05/07/2021  I saw and examined the patient.  I reviewed the fellow's note.  I agree with the findings and plan of care as documented in the fellow's note.  Any exceptions/additions are edited/noted.  Nelle Don, MD

## 2021-05-07 NOTE — Progress Notes (Signed)
[facility]  Surgical Oncology  Progress Note      [name], [name], 76 y.o. male  Date of Birth:  Feb 08, 1945  Date of Admission:  04/03/2021  Date of service: 05/07/2021    Assessment:  This is a 76 y.o. 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 He underwent massive transfusion protocol and ex lap on 04/03/21 at an outside facility with ligation of a bleeding vessel in the liver bed. Patient was subsequently transferred to [facility] postoperaively, intubated. Patient was subsequently extubated later that day on 3/4. 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      Plan/Recommendations:  - Follow up new blood cultures  - Continue dapto/merrem/fluconazole  - Diet: CLD + TPN  - Peripancreatic fluid collections   - GI yesterday with cystogastrostomy   - Continue octreotide   - GI scope on 3/23 unable to stent PD duct, patient with pancreatic divisim              - IR consulted; RUQ drain placement and empiric embolization 3/10              - Blood cultures 3/9: + pseudomonas    - Cultures form R JP: Pseudomonas and enterococcus              - Repeat Blood cultures 3/12 negative   - ID consulted; continue Daptomycin and meropenum (stop date 4/10)     - PE   - s/p IVC filter placement 3/17   - Aggressive pulm toilet, supp off O2   - Holding heparin gtt for 48 hrs.   - IVF: TPN 69m/hr  - Midline fistula with ostomy bag to monitor output  - OOB TID, encourage ambulation  -Pulmonary toilet, keep IS accessible at bedside  - PT/OT ordered, encourage pt to work with therapy  - Dispo: SDU, GI to scope today      Subjective:   Afebrile. No nausea this am.     Objective  Filed Vitals:    05/06/21 2330 05/07/21 0037 05/07/21 0258 05/07/21 0400   BP: 128/75   122/70   Pulse: 92   90   Resp: '13 12  18   '$ Temp:   36.3 C (97.3 F)     SpO2: 91%   92%     Physical Exam:   GEN:  AOx4, resting in bed, no acute distress  PULM: Normal respiratory effort. Equal/symmetric chest rise.  CV:  Pink, well perfused, RRR  ABD:   Abdomen soft, nontender, nondistended. Lower midline incision with feculent drainage present with ostomy pouch on top.  Pigtail catheter present with tan murky output.  MS: Atraumatic. Moves all extremities.  NEURO:   Alert and oriented to person, place and time.  Cranial nerves grossly intact.    Integumentary:  Pink, warm, and dry  PSYCHOSOCIAL: Pleasant.  Normal affect.     Labs  Results for orders placed or performed during the hospital encounter of 04/03/21 (from the past 24 hour(s))   BASIC METABOLIC PANEL   Result Value Ref Range    SODIUM 136 136 - 145 mmol/L    POTASSIUM 4.7 3.5 - 5.1 mmol/L    CHLORIDE 102 96 - 111 mmol/L    CO2 TOTAL 28 23 - 31 mmol/L    ANION GAP 6 4 -  13 mmol/L    CALCIUM 8.3 (L) 8.8 - 10.2 mg/dL    GLUCOSE 214 (H) 65 - 125 mg/dL    BUN 20 8 - 25 mg/dL    CREATININE 0.66 (L) 0.75 - 1.35 mg/dL    BUN/CREA RATIO 30 (H) 6 - 22    ESTIMATED GFR >90 >=60 mL/min/BSA    Narrative    Hemolysis can alter results at this level (mild).  Hemolysis can alter results at this level (mild).  Lipemia can alter results at this level (slight).   CBC   Result Value Ref Range    WBC 13.8 (H) 3.7 - 11.0 x10^3/uL    RBC 2.67 (L) 4.50 - 6.10 x10^6/uL    HGB 7.5 (L) 13.4 - 17.5 g/dL    HCT 24.3 (L) 38.9 - 52.0 %    MCV 91.0 78.0 - 100.0 fL    MCH 28.1 26.0 - 32.0 pg    MCHC 30.9 (L) 31.0 - 35.5 g/dL    RDW-CV 17.2 (H) 11.5 - 15.5 %    PLATELETS 457 (H) 150 - 400 x10^3/uL    MPV 11.6 8.7 - 12.5 fL   MAGNESIUM   Result Value Ref Range    MAGNESIUM 1.9 1.8 - 2.6 mg/dL    Narrative    Lipemia can alter results at this level (slight).   PHOSPHORUS   Result Value Ref Range    PHOSPHORUS 2.0 (L) 2.3 - 4.0 mg/dL    Narrative    Hemolysis alters results at this level (mild). Repeat testing with new specimen suggested.   POC BLOOD GLUCOSE  (RESULTS)   Result Value Ref Range    GLUCOSE, POC 204 (H) 70 - 105 mg/dl   POC BLOOD GLUCOSE (RESULTS)   Result Value Ref Range    GLUCOSE, POC 271 (H) 70 - 105 mg/dl   POC BLOOD GLUCOSE (RESULTS)   Result Value Ref Range    GLUCOSE, POC 272 (H) 70 - 105 mg/dl   POC BLOOD GLUCOSE (RESULTS)   Result Value Ref Range    GLUCOSE, POC 278 (H) 70 - 105 mg/dl   POC BLOOD GLUCOSE (RESULTS)   Result Value Ref Range    GLUCOSE, POC 215 (H) 70 - 105 mg/dl   POC BLOOD GLUCOSE (RESULTS)   Result Value Ref Range    GLUCOSE, POC 250 (H) 70 - 105 mg/dl       I/O:  Date 05/06/21 0700 - 05/07/21 0659 05/07/21 0700 - 05/08/21 0659   Shift 0700-1459 1500-2259 2300-0659 24 Hour Total 0700-1459 1500-2259 2300-0659 24 Hour Total   INTAKE   P.O.  120  120         Oral  120  120       I.V.(mL/kg/hr) 009.23(3.00) 762(2.63)  1342.38(0.78)         IVPB Volume   120  120         Med (IV) Flush Volume  36  36         Heparin Volume 101.38   101.38         Volume Infused (LR premix infusion) 585   585         Volume Infused (LR premix infusion)  500  500       IV Piggyback 62   62         Volume (DAPTOmycin (CUBICIN) 800 mg in NS 50 mL IVPB) 62   62       Shift Total(mL/kg) 335.45(62.56) 389(37.34)  0175.10(25.85)       OUTPUT   Urine(mL/kg/hr) 700(1.22) 200(0.35) 400(0.7) 1300(0.75)         Urine (Voided) 700 906 410 9396       Drains 75 80 22.5 177.5         JP Drain Output ([place] Drain Right;Upper Abdomen) 50 0 5 55         Drain Output (Drain (Miscellaneous) #72F pigtail catheter for RLQ fluid collection Lower;Right;Anterior Abdomen) 25 80 17.5 122.5         Drain Output (Drain (Miscellaneous) peds ostomy bag Lower;Medial Abdomen) 0 0 0 0       Shift Total(mL/kg) 775(10.79) 280(3.9) 422.5(5.88) 1477.5(20.58)       Weight (kg) 71.8 71.8 71.8 71.8 71.8 71.8 71.8 71.8       Radiology:  None    Current Medications:  Current Facility-Administered Medications   Medication Dose Route Frequency   . acetaminophen (TYLENOL) tablet  500 mg Oral  Q4H PRN   . adult custom parenteral nutrition   Intravenous Continuous   . atorvastatin (LIPITOR) tablet  40 mg Oral Daily with Breakfast   . benzonatate (TESSALON) capsule  100 mg Oral Q8H PRN   . D5W 250 mL flush bag   Intravenous Q15 Min PRN   . DAPTOmycin (CUBICIN) 800 mg in NS 50 mL IVPB  10 mg/kg (Adjusted) Intravenous Q24H   . [Held by provider] docusate sodium (COLACE) capsule  100 mg Oral 2x/day   . elvitegravir-cobicistat-emtricitrabine-tenofovir (GENVOYA) 150-150-200-10 mg per tablet  1 Tablet Oral Daily   . fluconazole (DIFLUCAN) tablet  400 mg Oral Daily   . gabapentin (NEURONTIN) capsule  300 mg Oral 3x/day   . heparin 25,000 units in NS 250 mL infusion  18 Units/kg/hr (Adjusted) Intravenous Continuous   . insulin glargine-yfgn 100 units/mL injection  10 Units Subcutaneous NIGHTLY   . insulin R human 100 units/mL injection  6 Units Subcutaneous Q6H   . LR premix infusion   Intravenous Continuous   . melatonin tablet  6 mg Oral NIGHTLY   . meropenem (MERREM) 1 g in NS 100 mL IVPB  1 g Intravenous Q8H   . methocarbamol (ROBAXIN) tablet  500 mg Oral 4x/day   . multivitamin-minerals-folic acid-lycopene-lutein (CERTAVITE SENIOR) tablet  1 Tablet Oral Q24H   . NS 250 mL flush bag   Intravenous Q15 Min PRN   . NS flush syringe  10-30 mL Intracatheter Q8HRS   . NS flush syringe  20-30 mL Intracatheter Q1 MIN PRN   . NS flush syringe  2-6 mL Intracatheter Q8HRS   . NS flush syringe  2-6 mL Intracatheter Q1 MIN PRN   . octreotide (SANDOSTATIN) 100 mcg/mL injection  100 mcg Intravenous 3x/day   . ondansetron (ZOFRAN) 2 mg/mL injection  4 mg Intravenous Q6H PRN   . oxyCODONE (ROXICODONE) immediate release tablet  2.5 mg Oral Q4H PRN   . oxyCODONE (ROXICODONE) immediate release tablet  5 mg Oral Q4H PRN   . pantoprazole (PROTONIX) delayed release tablet  40 mg Oral Q12H   . prochlorperazine (COMPAZINE) 5 mg/mL injection  10 mg Intravenous Q6H PRN   . [Held by provider] sennosides-docusate sodium (SENOKOT-S)  8.6-'50mg'$  per tablet  1 Tablet Oral 2x/day   . SSIP insulin R human (HUMULIN R) 100 units/mL injection  0-12 Units Subcutaneous Q6H PRN     Allene Pyo, PA-C  05/07/2021, 07:53

## 2021-05-08 LAB — BASIC METABOLIC PANEL
ANION GAP: 5 mmol/L (ref 4–13)
BUN/CREA RATIO: 41 — ABNORMAL HIGH (ref 6–22)
BUN: 27 mg/dL — ABNORMAL HIGH (ref 8–25)
CALCIUM: 8.5 mg/dL — ABNORMAL LOW (ref 8.8–10.2)
CHLORIDE: 104 mmol/L (ref 96–111)
CO2 TOTAL: 28 mmol/L (ref 23–31)
CREATININE: 0.66 mg/dL — ABNORMAL LOW (ref 0.75–1.35)
ESTIMATED GFR: >90 mL/min/BSA (ref >=60)
GLUCOSE: 164 mg/dL — ABNORMAL HIGH (ref 65–125)
POTASSIUM: 3.9 mmol/L (ref 3.5–5.1)
SODIUM: 137 mmol/L (ref 136–145)

## 2021-05-08 LAB — MAGNESIUM: MAGNESIUM: 1.9 mg/dL (ref 1.8–2.6)

## 2021-05-08 LAB — POC BLOOD GLUCOSE (RESULTS)
GLUCOSE, POC: 195 mg/dl — ABNORMAL HIGH (ref 70–105)
GLUCOSE, POC: 209 mg/dl — ABNORMAL HIGH (ref 70–105)
GLUCOSE, POC: 197 mg/dl — ABNORMAL HIGH (ref 70–105)
GLUCOSE, POC: 201 mg/dl — ABNORMAL HIGH (ref 70–105)
GLUCOSE, POC: 146 mg/dl — ABNORMAL HIGH (ref 70–105)

## 2021-05-08 LAB — CBC
HCT: 23.7 % — ABNORMAL LOW (ref 38.9–52.0)
HGB: 7.3 g/dL — ABNORMAL LOW (ref 13.4–17.5)
MCH: 27.9 pg (ref 26.0–32.0)
MCHC: 30.8 g/dL — ABNORMAL LOW (ref 31.0–35.5)
MCV: 90.5 fL (ref 78.0–100.0)
MPV: 12 fL (ref 8.7–12.5)
PLATELETS: 496 10*3/uL — ABNORMAL HIGH (ref 150–400)
RBC: 2.62 10*6/uL — ABNORMAL LOW (ref 4.50–6.10)
RDW-CV: 17.3 % — ABNORMAL HIGH (ref 11.5–15.5)
WBC: 14.5 10*3/uL — ABNORMAL HIGH (ref 3.7–11.0)

## 2021-05-08 LAB — PHOSPHORUS: PHOSPHORUS: 2.2 mg/dL — ABNORMAL LOW (ref 2.3–4.0)

## 2021-05-08 MED ORDER — INSULIN NPH ISOPHANE U-100 HUMAN 100 UNIT/ML SUBCUTANEOUS SUSPENSION
4.0000 [IU] | Freq: Two times a day (BID) | SUBCUTANEOUS | Status: DC
  Administered 2021-05-08 – 2021-05-09 (×3): 4 [IU] via SUBCUTANEOUS
  Filled 2021-05-08: qty 1000

## 2021-05-08 MED ORDER — WATER FOR INJECTION, STERILE INTRAVENOUS SOLUTION
INTRAVENOUS | Status: AC
Start: 2021-05-08 — End: 2021-05-09
  Filled 2021-05-08: qty 1300

## 2021-05-08 MED ORDER — INSULIN U-100 REGULAR HUMAN 100 UNIT/ML INJECTION SOLUTION
8.0000 [IU] | Freq: Four times a day (QID) | INTRAMUSCULAR | Status: DC
Start: 2021-05-08 — End: 2021-05-12
  Administered 2021-05-08 – 2021-05-09 (×3): 8 [IU] via SUBCUTANEOUS
  Administered 2021-05-09: 0 [IU] via SUBCUTANEOUS
  Administered 2021-05-09 – 2021-05-12 (×11): 8 [IU] via SUBCUTANEOUS
  Filled 2021-05-08: qty 0.08

## 2021-05-08 MED ORDER — SODIUM DI- AND MONOPHOSPHATE-POTASSIUM PHOS MONOBASIC 250 MG TABLET
250.0000 mg | ORAL_TABLET | Freq: Four times a day (QID) | ORAL | Status: DC
Start: 2021-05-08 — End: 2021-05-08

## 2021-05-08 MED ORDER — INSULIN U-100 REGULAR HUMAN 100 UNIT/ML INJECTION SOLUTION
8.0000 [IU] | Freq: Three times a day (TID) | INTRAMUSCULAR | Status: DC
Start: 2021-05-08 — End: 2021-05-08
  Filled 2021-05-08: qty 0.08

## 2021-05-08 MED ORDER — INSULIN GLARGINE-YFGN (U-100) 100 UNIT/ML SUBCUTANEOUS SOLUTION
10.0000 [IU] | Freq: Every evening | SUBCUTANEOUS | Status: DC
Start: 2021-05-08 — End: 2021-05-16
  Administered 2021-05-08 – 2021-05-15 (×8): 10 [IU] via SUBCUTANEOUS
  Filled 2021-05-08 (×9): qty 10

## 2021-05-08 MED ORDER — HEPARIN 25,000 UNIT/250 ML (100 UNIT/ML) IN NS IV SOLN
18.0000 [IU]/kg/h | INTRAVENOUS | Status: DC
Start: 2021-05-08 — End: 2021-05-25
  Administered 2021-05-08 – 2021-05-09 (×4): 18 [IU]/kg/h via INTRAVENOUS
  Administered 2021-05-10 (×3): 19 [IU]/kg/h via INTRAVENOUS
  Administered 2021-05-11: 22 [IU]/kg/h via INTRAVENOUS
  Administered 2021-05-11: 20 [IU]/kg/h via INTRAVENOUS
  Administered 2021-05-11: 21 [IU]/kg/h via INTRAVENOUS
  Administered 2021-05-11: 19 [IU]/kg/h via INTRAVENOUS
  Administered 2021-05-11: 20 [IU]/kg/h via INTRAVENOUS
  Administered 2021-05-11: 21 [IU]/kg/h via INTRAVENOUS
  Administered 2021-05-12 – 2021-05-13 (×6): 22 [IU]/kg/h via INTRAVENOUS
  Administered 2021-05-14 (×3): 23 [IU]/kg/h via INTRAVENOUS
  Administered 2021-05-14: 22 [IU]/kg/h via INTRAVENOUS
  Administered 2021-05-14 – 2021-05-17 (×9): 23 [IU]/kg/h via INTRAVENOUS
  Administered 2021-05-18 (×3): 24 [IU]/kg/h via INTRAVENOUS
  Administered 2021-05-18: 23 [IU]/kg/h via INTRAVENOUS
  Administered 2021-05-19 (×5): 25 [IU]/kg/h via INTRAVENOUS
  Administered 2021-05-20: 0 [IU]/kg/h via INTRAVENOUS
  Administered 2021-05-22: 21 [IU]/kg/h via INTRAVENOUS
  Administered 2021-05-22: 18 [IU]/kg/h via INTRAVENOUS
  Administered 2021-05-23 (×2): 21 [IU]/kg/h via INTRAVENOUS
  Administered 2021-05-24 (×4): 24 [IU]/kg/h via INTRAVENOUS
  Administered 2021-05-25: 0 [IU]/kg/h via INTRAVENOUS
  Filled 2021-05-08 (×22): qty 250

## 2021-05-08 NOTE — Nurses Notes (Signed)
The patient is handed over to redceiving RN as floor status. He is transported to room 961.

## 2021-05-08 NOTE — Progress Notes (Signed)
[facility]  Surgical Oncology  Progress Note      [name], [name], 76 y.o. male  Date of Birth:  1945/10/16  Date of Admission:  04/03/2021  Date of service: 05/08/2021    Assessment:  This is a 76 y.o. 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 He underwent massive transfusion protocol and ex lap on 04/03/21 at an outside facility with ligation of a bleeding vessel in the liver bed. Patient was subsequently transferred to [facility] postoperaively, intubated. Patient was subsequently extubated later that day on 3/4. 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      Plan/Recommendations:  - Blood cultures NGTD  - Plan to restart Heparin tonight  - Follow up Endocrine recommendations  - Continue dapto/merrem/fluconazole  - Diet: CLD + TPN  - Peripancreatic fluid collections   - GI yesterday with cystogastrostomy   - Continue octreotide   - GI scope on 3/23 unable to stent PD duct, patient with pancreatic divisim              - IR consulted; RUQ drain placement and empiric embolization 3/10              - Blood cultures 3/9: + pseudomonas    - Cultures form R JP: Pseudomonas and enterococcus              - Repeat Blood cultures 3/12 negative   - ID consulted; continue Daptomycin and meropenum (stop date 4/10)     - PE   - s/p IVC filter placement 3/17   - Aggressive pulm toilet, supp off O2   - Holding heparin gtt for 48 hrs.   - IVF: TPN 46m/hr  - Midline fistula with ostomy bag to monitor output  - OOB TID, encourage ambulation  -Pulmonary toilet, keep IS accessible at bedside  - PT/OT ordered, encourage pt to work with therapy  - Dispo: SDU      Subjective:   Patient states he slept better last night. States he is feeling down and is hoping to get out of the hospital soon. Nausea has improved.    Objective  Filed  Vitals:    05/07/21 2245 05/07/21 2345 05/08/21 0348 05/08/21 0400   BP: 117/68   124/73   Pulse: 62   64   Resp: '13 14  12   '$ Temp: 36 C (96.8 F)  36.6 C (97.9 F)    SpO2: 91%   92%     Physical Exam:   GEN:  AOx4, resting in bed, no acute distress  PULM: Normal respiratory effort. Equal/symmetric chest rise.  CV:  Pink, well perfused, RRR  ABD:   Abdomen soft, nontender, nondistended. Lower midline incision with feculent drainage present with ostomy pouch on top.  Pigtail catheter present with tan murky output.  MS: Atraumatic. Moves all extremities.  NEURO:   Alert and oriented to person, place and time.  Cranial nerves grossly intact.    Integumentary:  Pink, warm, and dry  PSYCHOSOCIAL: Pleasant.  Normal affect.     Labs  Results for orders placed or performed during the hospital encounter of 04/03/21 (from the past 24 hour(s))   BASIC METABOLIC PANEL   Result Value Ref Range    SODIUM 137 136 - 145 mmol/L    POTASSIUM 3.9 3.5 -  5.1 mmol/L    CHLORIDE 104 96 - 111 mmol/L    CO2 TOTAL 28 23 - 31 mmol/L    ANION GAP 5 4 - 13 mmol/L    CALCIUM 8.5 (L) 8.8 - 10.2 mg/dL    GLUCOSE 164 (H) 65 - 125 mg/dL    BUN 27 (H) 8 - 25 mg/dL    CREATININE 0.66 (L) 0.75 - 1.35 mg/dL    BUN/CREA RATIO 41 (H) 6 - 22    ESTIMATED GFR >90 >=60 mL/min/BSA    Narrative    Hemolysis can alter results at this level (slight).  Lipemia can alter results at this level (slight).   CBC   Result Value Ref Range    WBC 14.5 (H) 3.7 - 11.0 x10^3/uL    RBC 2.62 (L) 4.50 - 6.10 x10^6/uL    HGB 7.3 (L) 13.4 - 17.5 g/dL    HCT 23.7 (L) 38.9 - 52.0 %    MCV 90.5 78.0 - 100.0 fL    MCH 27.9 26.0 - 32.0 pg    MCHC 30.8 (L) 31.0 - 35.5 g/dL    RDW-CV 17.3 (H) 11.5 - 15.5 %    PLATELETS 496 (H) 150 - 400 x10^3/uL    MPV 12.0 8.7 - 12.5 fL   MAGNESIUM   Result Value Ref Range    MAGNESIUM 1.9 1.8 - 2.6 mg/dL    Narrative    Hemolysis can alter results at this level (slight).  Lipemia can alter results at this level (slight).   PHOSPHORUS   Result  Value Ref Range    PHOSPHORUS 2.2 (L) 2.3 - 4.0 mg/dL    Narrative    Hemolysis can alter results at this level (slight).   POC BLOOD GLUCOSE (RESULTS)   Result Value Ref Range    GLUCOSE, POC 245 (H) 70 - 105 mg/dl   POC BLOOD GLUCOSE (RESULTS)   Result Value Ref Range    GLUCOSE, POC 251 (H) 70 - 105 mg/dl   POC BLOOD GLUCOSE (RESULTS)   Result Value Ref Range    GLUCOSE, POC 238 (H) 70 - 105 mg/dl   POC BLOOD GLUCOSE (RESULTS)   Result Value Ref Range    GLUCOSE, POC 210 (H) 70 - 105 mg/dl   POC BLOOD GLUCOSE (RESULTS)   Result Value Ref Range    GLUCOSE, POC 195 (H) 70 - 105 mg/dl       I/O:  Date 05/07/21 0700 - 05/08/21 0659 05/08/21 0700 - 05/09/21 0659   Shift 0700-1459 1500-2259 2300-0659 24 Hour Total 0700-1459 1500-2259 2300-0659 24 Hour Total   INTAKE   P.O.  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 acetaminophen (TYLENOL) tablet  500 mg Oral Q4H PRN   . adult custom parenteral nutrition    Intravenous Continuous   . atorvastatin (LIPITOR) tablet  40 mg Oral Daily with Breakfast   . benzonatate (TESSALON) capsule  100 mg Oral Q8H PRN   . D5W 250 mL flush bag   Intravenous Q15 Min PRN   . DAPTOmycin (CUBICIN) 800 mg in NS 50 mL IVPB  10 mg/kg (Adjusted) Intravenous Q24H   . [Held by provider] docusate sodium (COLACE) capsule  100 mg Oral 2x/day   . elvitegravir-cobicistat-emtricitrabine-tenofovir (GENVOYA) 150-150-200-10 mg per tablet  1 Tablet Oral Daily   . fluconazole (DIFLUCAN) tablet  400 mg Oral Daily   . gabapentin (NEURONTIN) capsule  300 mg Oral 3x/day   . [Held by provider] heparin 25,000 units in NS 250 mL infusion  18 Units/kg/hr (Adjusted) Intravenous Continuous   . heparin 5,000 unit/mL injection  5,000 Units Subcutaneous Q8HRS   . insulin glargine-yfgn 100 units/mL injection  14 Units Subcutaneous NIGHTLY   . insulin R human 100 units/mL injection  8 Units Subcutaneous Q6H   . LR premix infusion   Intravenous Continuous   . melatonin tablet  6 mg Oral NIGHTLY   . meropenem (MERREM) 1 g in NS 100 mL IVPB  1 g Intravenous Q8H   . methocarbamol (ROBAXIN) tablet  500 mg Oral 4x/day   . multivitamin-minerals-folic acid-lycopene-lutein (CERTAVITE SENIOR) tablet  1 Tablet Oral Q24H   . NS 250 mL flush bag   Intravenous Q15 Min PRN   . NS flush syringe  10-30 mL Intracatheter Q8HRS   . NS flush syringe  20-30 mL Intracatheter Q1 MIN PRN   . NS flush syringe  2-6 mL Intracatheter Q8HRS   . NS flush syringe  2-6 mL Intracatheter Q1 MIN PRN   . ondansetron (ZOFRAN) 2 mg/mL injection  4 mg Intravenous Q6H PRN   . oxyCODONE (ROXICODONE) immediate release tablet  2.5 mg Oral Q4H PRN   . oxyCODONE (ROXICODONE) immediate release tablet  5 mg Oral Q4H PRN   . pantoprazole (PROTONIX) delayed release tablet  40 mg Oral Q12H   . prochlorperazine (COMPAZINE) 5 mg/mL injection  10 mg Intravenous Q6H PRN   . [Held by provider] sennosides-docusate sodium (SENOKOT-S) 8.6-'50mg'$  per tablet  1 Tablet Oral 2x/day   .  SSIP insulin R human (HUMULIN R) 100 units/mL injection  0-12 Units Subcutaneous Q6H PRN       Micheline Chapman, MD  Department of Family Medicine, PGY-1  Pager: [phone]

## 2021-05-08 NOTE — Nurses Notes (Signed)
Per endocrinology service, the patient's scheduled intermediate insulin and regular insulin are meant as scheduled doses to cover during TPN infusion, and is not meant as pre-meal insulin doses.

## 2021-05-08 NOTE — Care Plan (Signed)
[facility]  Physical Therapy Progress Note      Patient Name: [name]  Date of Birth: March 26, 1945  Height:  175.3 cm (5' 9.02")  Weight:  71.8 kg (158 lb 4.6 oz)  Room/Bed: 973/A  Payor: [facility] [employer] / Plan: [place] [facility] / Product Type: Managed Care /     Assessment:     Mr. Abascal was alert, weary, and agreeable to PT tx. He demonstrated improved quality and quantity of gt again today. He continues to demonstrate generalized weakness, decreased activity tol, impaired balance, and impaired mobility. His mobility is not currently adequate for safe d/c to home but he has good rehab potential. Rec IRF at d/c.    Discharge Needs:   Equipment Recommendation: TBD  Discharge Disposition: inpatient rehabilitation facility    JUSTIFICATION OF DISCHARGE RECOMMENDATION   Based on current diagnosis, functional performance prior to admission, and current functional performance, this patient requires continued PT services in inpatient rehabilitation facility in order to achieve significant functional improvements in these deficit areas: aerobic capacity/endurance, gait, locomotion, and balance, muscle performance.      Plan:   Continue to follow patient according to established plan of care.  The risks/benefits of therapy have been discussed with the patient/caregiver and he/she is in agreement with the established plan of care.     Subjective & Objective:      05/07/21 1334   Therapist Pager   PT Assigned/ Pager # [name] [phone]   Rehab Session   Document Type therapy progress note (daily note)   Total PT Minutes: 16   Patient Effort good   Symptoms Noted During/After Treatment fatigue;dizziness   Symptoms Noted Comment mild dizziness after transfer to EOB; resolved quickly.   General Information   Patient Profile Reviewed yes   Patient/Family/Caregiver Comments/Observations Pt was alert, fatigued, and agreeable to PT tx. Wife was at b/s and supportive.   Medical  Lines Peripheral Drain;PICC Line;TPN   Existing Precautions/Restrictions contact isolation;fall precautions   General Observations of Patient Pt was seen at b/s on 9e for co-tx with OT with permission of b/s nurse.   Mutuality/Individual Preferences   Individualized Care Needs OOB with FWW and assist x1-2   Pre Treatment Status   Pre Treatment Patient Status Patient supine in bed;Call light within reach;Telephone within reach   Support Present Pre Treatment  Family present   Communication Pre Treatment  Nurse   Cognitive Assessment/Interventions   Behavior/Mood Observations alert;cooperative;behavior appropriate to situation, WNL/WFL   Attention WNL/WFL   Follows Commands WNL   Vital Signs   Pre-Treatment Heart Rate (beats/min) 62   Post-treatment Heart Rate (beats/min) 100   Pre Treatment BP 129/72   Pre SpO2 (%) 91   O2 Delivery Pre Treatment room air   Post SpO2 (%) 91   O2 Delivery Post Treatment room air   Pain Assessment   Pretreatment Pain Rating 0/10 - no pain   Posttreatment Pain Rating 0/10 - no pain   Mobility Assessment/Training   Comment Pt was assisted to sitting EOB. He sat briefly to let mild dizziness resolve. He stood to [facility] and ambulated in hall with FWW and assist x 1 plus assist for line management and chair follow.   Bed Mobility Assessment/Treatment   Supine-Sit [place] minimum assist (75% patient effort)   Safety Issues decreased use of arms for pushing/pulling;decreased use of legs for bridging/pushing;impaired trunk control for bed mobility   Impairments balance impaired;coordination impaired;postural control impaired;strength  decreased   Comment Pt was able to move his LEs over EOB but needed min A to bring his trunk to upright.   Transfer Assessment/Treatment   Sit-Stand [place] minimum assist (75% patient effort);verbal cues required   Stand-Sit [place] minimum assist (75% patient effort);verbal cues required   Sit-Stand-Sit, Assist Device walker, front wheeled   Transfer  Safety Issues weight-shifting ability decreased;step length decreased   Transfer Impairments balance impaired;coordination impaired;endurance;postural control impaired;strength decreased   Transfer Comment Pt needed verbal cues for hand placement. Static standing at [facility] with CGA.   Gait Assessment/Treatment   Independence  contact guard assist;1 person + 1 person to manage equipment   Assistive Device  walker, front wheeled   Distance in Feet 85 ft   Gait Speed decreased but improving   Deviations  cadence decreased;double stance time increased;limb motion velocity decreased;stride length decreased;step length decreased;stride width increased;swing-to-stance ratio decreased;toe-to-floor clearance decreased;weight-shifting ability decreased   Safety Issues  step length decreased   Impairments  balance impaired;endurance;postural control impaired;strength decreased   Comment Pace of gt slightly improved again today.   Balance Skill Training   Comment with FWW in standing   Sitting Balance: Static fair + balance   Sitting, Dynamic (Balance) fair - balance   Sit-to-Stand Balance poor + balance   Standing Balance: Static fair - balance   Standing Balance: Dynamic fair - balance   Systems Impairment Contributing to Balance Disturbance musculoskeletal   Identified Impairments Contributing to Balance Disturbance impaired postural control;decreased strength   Post Treatment Status   Post Treatment Patient Status Patient sitting in bedside chair or w/c;Call light within reach;Telephone within reach   Support Present Post Treatment  Family present   Communication [place] With patient;spouse   Basic Mobility Am-PAC/6Clicks Score (APPROVED Staff)   Turning in bed without bedrails 3   Lying on back to sitting on edge of flat bed 3   Moving to and from a bed to a chair 3   Standing up from chair 3   Walk in room 3   Climbing 3-5 steps with railing 2   6 Clicks Raw Score  total 17   Standardized (t-scale) score 39.67   CMS 0-100% Score 43.83   CMS Modifier CK   Patient Mobility Goal (JHHLM) 5- Stand 1 minute or more 3X/day   Exercise/Activity Level Performed 7- Walked 25 feet or more   Physical Therapy Clinical Impression   Assessment Mr. Aubuchon was alert, weary, and agreeable to PT tx. He demonstrated improved quality and quantity of gt again today. He continues to demonstrate generalized weakness, decreased activity tol, impaired balance, and impaired mobility. His mobility is not currently adequate for safe d/c to home but he has good rehab potential. Rec IRF at d/c.   Anticipated Equipment Needs at Discharge (PT) TBD   Anticipated Discharge Disposition inpatient rehabilitation facility       Therapist:   Norlene Duel, PT   Pager #: [phone]

## 2021-05-08 NOTE — Nurses Notes (Addendum)
Paged surg onc service-     [phone]. [phone] per GI note, they recommend holding PPI and anticoag/antiplatelets. Do you still want protonix given and heparin gtts started tonight?     [name] returned page stating that the heparin gtts is appropriate to give because of patients history of DVT and PE. He stated he would get clarification on Protonix.

## 2021-05-08 NOTE — Consults (Signed)
[facility]  Endocrinology Consult  Follow Up Note    [name], [name], 76 y.o. male  Date of Service: 05/08/2021  Date of Birth:  08-07-1945    Hospital Day:  LOS: 35 days     Reason for Consult: : Diabetes management     [name] is a 76 y.o. 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 Endocrinology is consulted for management of diabetes.      Subjective:   Patient seen at bedside, denies any complaints no nausea, vomiting. TPN running at bedside. States not on clear liquid diet but trying to drink water. Per team plan to continue TPN till oral intake improves.    Current Facility-Administered Medications   Medication Dose Route Frequency Provider Last Rate Last Admin    acetaminophen (TYLENOL) tablet  500 mg Oral Q4H PRN Donna Christen, MD   500 mg at 05/05/21 1306    adult custom parenteral nutrition   Intravenous Continuous Allene Pyo, PA-C 90 mL/hr at 05/08/21 7408 Solution Verified at 05/08/21 0705    adult custom parenteral nutrition   Intravenous Continuous Nicola Police, MD        atorvastatin (LIPITOR) tablet  40 mg Oral Daily with Breakfast Nicola Police, MD   40 mg at 05/08/21 0846    benzonatate (TESSALON) capsule  100 mg Oral Q8H PRN [name], MD        D5W 250 mL flush bag   Intravenous Q15 Min PRN Modena Jansky, DO        DAPTOmycin (CUBICIN) 800 mg in NS 50 mL IVPB  10 mg/kg (Adjusted) Intravenous Q24H Cosner, Adam, PA-C   Stopped at 05/07/21 1446    [Held by provider] docusate sodium (COLACE) capsule  100 mg Oral 2x/day Cosner, Adam, PA-C   100 mg at 04/26/21 1448    elvitegravir-cobicistat-emtricitrabine-tenofovir (GENVOYA) 150-150-200-10 mg per tablet  1 Tablet Oral Daily Monia Pouch, DO   1 Tablet at 05/08/21 0630    fluconazole (DIFLUCAN) tablet  400  mg Oral Daily Cosner, Adam, PA-C   400 mg at 05/08/21 0846    gabapentin (NEURONTIN) capsule  300 mg Oral 3x/day Ranae Palms, DO   300 mg at 05/08/21 0846    heparin 25,000 units in NS 250 mL infusion  18 Units/kg/hr (Adjusted) Intravenous Continuous Lucile Crater, MD        heparin 5,000 unit/mL injection  5,000 Units Subcutaneous Duwaine Maxin, MD   5,000 Units at 05/08/21 0604    insulin glargine-yfgn 100 units/mL injection  10 Units Subcutaneous NIGHTLY Shafiq, Ramsha, MD        insulin NPH human 100 units/mL injection  4 Units Subcutaneous 2x/day Shafiq, Ramsha, MD        insulin R human 100 units/mL injection  8 Units Subcutaneous Q6H Shafiq, Ramsha, MD        LR premix infusion   Intravenous Continuous Mariea Clonts, MD 45 mL/hr at 05/06/21 2028 New Bag at 05/06/21 2028    melatonin tablet  6 mg Oral NIGHTLY Croley, Mallory, MD   6 mg at 05/07/21 2248    meropenem (MERREM) 1 g in NS 100 mL IVPB  1 g Intravenous Q8H Cosner, Adam, PA-C   Stopped at 05/08/21 0427    methocarbamol (ROBAXIN) tablet  500 mg Oral  4x/day Larna Daughters, MD   500 mg at 05/08/21 2683    multivitamin-minerals-folic acid-lycopene-lutein (CERTAVITE SENIOR) tablet  1 Tablet Oral Q24H Cosner, Adam, PA-C   1 Tablet at 05/07/21 1309    NS 250 mL flush bag   Intravenous Q15 Min PRN Modena Jansky, DO        NS flush syringe  10-30 mL Intracatheter Q8HRS Nicola Police, MD   10 mL at 05/06/21 2200    NS flush syringe  20-30 mL Intracatheter Q1 MIN PRN Nicola Police, MD        NS flush syringe  2-6 mL Intracatheter Q8HRS Modena Jansky, DO   6 mL at 05/07/21 1400    NS flush syringe  2-6 mL Intracatheter Q1 MIN PRN Modena Jansky, DO        ondansetron [facility]) 2 mg/mL injection  4 mg Intravenous Q6H PRN Ranae Palms, DO   4 mg at 05/07/21 2130    oxyCODONE (ROXICODONE) immediate release tablet  2.5 mg Oral Q4H PRN Stacey Drain, DO   2.5 mg at 05/05/21 1613    oxyCODONE (ROXICODONE) immediate  release tablet  5 mg Oral Q4H PRN Stacey Drain, DO   5 mg at 05/08/21 0646    pantoprazole (PROTONIX) delayed release tablet  40 mg Oral Q12H [name], MD   40 mg at 05/08/21 4196    prochlorperazine (COMPAZINE) 5 mg/mL injection  10 mg Intravenous Q6H PRN Stacey Drain, DO   10 mg at 05/08/21 2229    [Held by provider] sennosides-docusate sodium (SENOKOT-S) 8.6-'50mg'$  per tablet  1 Tablet Oral 2x/day Cosner, Adam, PA-C   1 Tablet at 04/26/21 2148    SSIP insulin R human (HUMULIN R) 100 units/mL injection  0-12 Units Subcutaneous Q6H PRN Donna Christen, MD         Objective:  Temperature: 36.5 C (97.7 F)  Heart Rate: 64  BP (Non-Invasive): 114/65  Respiratory Rate: 16  SpO2: 90 %  General: Chronically ill appearing male in no acute distress.  HEENT: Head normocephalic, atraumatic. Conjunctiva clear. No proptosis or lid lag. Oral mucosa pink and moist.   Thyroid/Neck: No thyromegaly. Trachea midline.  Lungs: Normal respiratory effort.   Cardiac: Regular rate and rhythm.  Abdomen: Soft, non-tender, non-distended.   Extremities: No edema, erythema, warmth, or tenderness.  Skin: Warm and dry. No visible rashes.  Neuro: Alert and oriented x 3. Speech fluent. No tremors.   Psychiatric: Normal mood and affect.      Lab Results   Component Value Date    WBC 14.5 (H) 05/08/2021    RBC 2.62 (L) 05/08/2021    HGB 7.3 (L) 05/08/2021    HCT 23.7 (L) 05/08/2021    MCV 90.5 05/08/2021    MCH 27.9 05/08/2021    MCHC 30.8 (L) 05/08/2021     Lab Results   Component Value Date    SODIUM 137 05/08/2021    POTASSIUM 3.9 05/08/2021    CO2 28 05/08/2021    BUN 27 (H) 05/08/2021    CREATININE 0.66 (L) 05/08/2021    GFR >90 05/08/2021    CALCIUM 8.5 (L) 05/08/2021    ALBUMIN 1.7 (L) 05/03/2021    ALKPHOS 94 05/03/2021    ALT 12 05/03/2021    AST 17 05/03/2021    ANIONGAP 5 05/08/2021     Lab Results   Component Value Date    CHOLESTEROL 212 (H) 12/21/2020    HDLCHOL 44 12/21/2020  LDLCHOL 96 12/21/2020    LDLCHOLDIR 89  03/24/2020    TRIG 165 (H) 05/03/2021      Lab Results   Component Value Date    HA1C 9.3 (H) 03/17/2021         Recent Labs     05/06/21  1807 05/06/21  2154 05/06/21  2335 05/07/21  0351 05/07/21  0556 05/07/21  1013 05/07/21  1414 05/07/21  1814 05/07/21  2257 05/08/21  0602 05/08/21  1003   GLUIP 271* 272* 278* 215* 250* 245* 251* 238* 210* 195* 209*       Assessment/Recommendations:  [name] is a 76 y.o. 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 Endocrinology is consulted for management of diabetes.     Post Pancreatectomy Diabetes Mellitus  - Most recent HbA1c on 03/17/2021 was 9.3%.  - Home regimen: Lantus 11 units nightly, Novolog 4 units TID AC.   - Current inpatient regimen: Lantus 14 units nightly, Humulin R 8 units Q6 hours, and Humulin R moderate sliding scale 4x/day PRN.   -Diet: not on scheduled clear liquid diet  - TPN currently running at 90 mL/hr.     His fasting sugars in 190-200 today morning.    - Recommendations:     -will add NPH insulin 4 units BID for coverage for TPN for now  -since we are adding NPH will plan to reduce Lantus to 10 units nightly from today   -we will continue pre meal Insulin R 8 units Q6 with continuous TPN for now. If he gets started on scheduled liquid diet we can change it to lispro with meals.  -c/w moderate humulin R SSI Q6 prn    Thank you for the consult. Endocrine will follow.    Marylu Lund, MD  Fellow, Endocrinology, Diabetes and Metabolism  05/08/2021  11:41    I saw and examined the patient. I reviewed the fellow's note. I agree with the findings and plan of care as documented in the fellow's note. Any exceptions/additions are edited/noted.    Keith Rake, MD, MPH 05/08/2021, 19:30

## 2021-05-09 DIAGNOSIS — E119 Type 2 diabetes mellitus without complications: Secondary | ICD-10-CM

## 2021-05-09 DIAGNOSIS — I10 Essential (primary) hypertension: Secondary | ICD-10-CM

## 2021-05-09 DIAGNOSIS — Z794 Long term (current) use of insulin: Secondary | ICD-10-CM

## 2021-05-09 DIAGNOSIS — E785 Hyperlipidemia, unspecified: Secondary | ICD-10-CM

## 2021-05-09 DIAGNOSIS — E039 Hypothyroidism, unspecified: Secondary | ICD-10-CM

## 2021-05-09 LAB — BASIC METABOLIC PANEL
ANION GAP: 5 mmol/L (ref 4–13)
BUN/CREA RATIO: 45 — ABNORMAL HIGH (ref 6–22)
BUN: 27 mg/dL — ABNORMAL HIGH (ref 8–25)
CALCIUM: 8.6 mg/dL — ABNORMAL LOW (ref 8.8–10.2)
CHLORIDE: 103 mmol/L (ref 96–111)
CO2 TOTAL: 29 mmol/L (ref 23–31)
CREATININE: 0.6 mg/dL — ABNORMAL LOW (ref 0.75–1.35)
ESTIMATED GFR: >90 mL/min/BSA (ref >=60)
GLUCOSE: 136 mg/dL — ABNORMAL HIGH (ref 65–125)
POTASSIUM: 4.4 mmol/L (ref 3.5–5.1)
SODIUM: 137 mmol/L (ref 136–145)

## 2021-05-09 LAB — PTT (PARTIAL THROMBOPLASTIN TIME)
APTT: 62.2 seconds — ABNORMAL HIGH (ref 24.2–37.5)
APTT: 68.1 seconds — ABNORMAL HIGH (ref 24.2–37.5)
APTT: 63.8 seconds — ABNORMAL HIGH (ref 24.2–37.5)
APTT: 67 seconds — ABNORMAL HIGH (ref 24.2–37.5)

## 2021-05-09 LAB — CBC
HCT: 25.3 % — ABNORMAL LOW (ref 38.9–52.0)
HGB: 7.8 g/dL — ABNORMAL LOW (ref 13.4–17.5)
MCH: 27.9 pg (ref 26.0–32.0)
MCHC: 30.8 g/dL — ABNORMAL LOW (ref 31.0–35.5)
MCV: 90.4 fL (ref 78.0–100.0)
MPV: 12 fL (ref 8.7–12.5)
PLATELETS: 511 10*3/uL — ABNORMAL HIGH (ref 150–400)
RBC: 2.8 10*6/uL — ABNORMAL LOW (ref 4.50–6.10)
RDW-CV: 17.4 % — ABNORMAL HIGH (ref 11.5–15.5)
WBC: 20.6 10*3/uL — ABNORMAL HIGH (ref 3.7–11.0)

## 2021-05-09 LAB — PHOSPHORUS: PHOSPHORUS: 2 mg/dL — ABNORMAL LOW (ref 2.3–4.0)

## 2021-05-09 LAB — MAGNESIUM: MAGNESIUM: 1.9 mg/dL (ref 1.8–2.6)

## 2021-05-09 LAB — POC BLOOD GLUCOSE (RESULTS)
GLUCOSE, POC: 143 mg/dl — ABNORMAL HIGH (ref 70–105)
GLUCOSE, POC: 173 mg/dl — ABNORMAL HIGH (ref 70–105)
GLUCOSE, POC: 183 mg/dl — ABNORMAL HIGH (ref 70–105)
GLUCOSE, POC: 198 mg/dl — ABNORMAL HIGH (ref 70–105)
GLUCOSE, POC: 201 mg/dl — ABNORMAL HIGH (ref 70–105)
GLUCOSE, POC: 231 mg/dl — ABNORMAL HIGH (ref 70–105)

## 2021-05-09 MED ORDER — INSULIN NPH ISOPHANE U-100 HUMAN 100 UNIT/ML SUBCUTANEOUS SUSPENSION
5.0000 [IU] | Freq: Two times a day (BID) | SUBCUTANEOUS | Status: DC
  Administered 2021-05-09 – 2021-05-11 (×4): 5 [IU] via SUBCUTANEOUS
  Filled 2021-05-09: qty 1000

## 2021-05-09 MED ORDER — SODIUM DI- AND MONOPHOSPHATE-POTASSIUM PHOS MONOBASIC 250 MG TABLET
250.0000 mg | ORAL_TABLET | Freq: Four times a day (QID) | ORAL | Status: DC
Start: 2021-05-09 — End: 2021-05-09

## 2021-05-09 MED ORDER — DEXTROSE 70 % IN WATER (D70W) INTRAVENOUS SOLUTION
INTRAVENOUS | Status: AC
Start: 2021-05-09 — End: 2021-05-10
  Filled 2021-05-09: qty 1300

## 2021-05-09 MED ORDER — INSULIN REGULAR HUMAN 100 UNIT/ML INJECTION SSIP
0.0000 [IU] | INJECTION | Freq: Four times a day (QID) | SUBCUTANEOUS | Status: DC | PRN
Start: 2021-05-09 — End: 2021-05-09
  Filled 2021-05-09: qty 300

## 2021-05-09 MED ORDER — SODIUM PHOSPHATE 3 MMOL/ML INTRAVENOUS SOLUTION
30.0000 mmol | Freq: Once | INTRAVENOUS | Status: DC
Start: 2021-05-09 — End: 2021-05-09

## 2021-05-09 MED ORDER — INSULIN REGULAR HUMAN 100 UNIT/ML INJECTION SSIP
0.0000 [IU] | INJECTION | Freq: Four times a day (QID) | SUBCUTANEOUS | Status: DC | PRN
Start: 2021-05-09 — End: 2021-05-27
  Administered 2021-05-09: 2 [IU] via SUBCUTANEOUS
  Administered 2021-05-09: 4 [IU] via SUBCUTANEOUS
  Administered 2021-05-10 – 2021-05-11 (×6): 2 [IU] via SUBCUTANEOUS
  Administered 2021-05-11 – 2021-05-12 (×2): 4 [IU] via SUBCUTANEOUS
  Administered 2021-05-12: 2 [IU] via SUBCUTANEOUS
  Administered 2021-05-12 (×2): 4 [IU] via SUBCUTANEOUS
  Administered 2021-05-13 – 2021-05-14 (×3): 2 [IU] via SUBCUTANEOUS
  Administered 2021-05-14: 6 [IU] via SUBCUTANEOUS
  Administered 2021-05-15 (×2): 4 [IU] via SUBCUTANEOUS
  Administered 2021-05-15: 2 [IU] via SUBCUTANEOUS
  Administered 2021-05-16: 6 [IU] via SUBCUTANEOUS
  Administered 2021-05-16: 4 [IU] via SUBCUTANEOUS
  Administered 2021-05-16 – 2021-05-17 (×2): 6 [IU] via SUBCUTANEOUS
  Administered 2021-05-18: 2 [IU] via SUBCUTANEOUS
  Administered 2021-05-19 – 2021-05-20 (×3): 4 [IU] via SUBCUTANEOUS
  Administered 2021-05-20 – 2021-05-21 (×3): 2 [IU] via SUBCUTANEOUS
  Administered 2021-05-21: 4 [IU] via SUBCUTANEOUS
  Administered 2021-05-23: 2 [IU] via SUBCUTANEOUS
  Administered 2021-05-25: 0 [IU] via SUBCUTANEOUS
  Administered 2021-05-25: 2 [IU] via SUBCUTANEOUS
  Administered 2021-05-26: 6 [IU] via SUBCUTANEOUS
  Administered 2021-05-27: 4 [IU] via SUBCUTANEOUS
  Filled 2021-05-09 (×2): qty 300

## 2021-05-09 NOTE — Nurses Notes (Signed)
PTT resulted at 1534 was 63.8. Per protocol, no adjustments made in heparin rate. Next PTT ordered for 2130.

## 2021-05-09 NOTE — Nurses Notes (Signed)
PTT resulted 2131 was 67. Per protocol, no adjustments made in heparin rate. Four consecutive results within therapeutic range. Per thrombotic protocol, next PTT to be drawn at 0530 with morning labs.   Kathi Simpers, RN STUDENT  05/09/2021, 22:01

## 2021-05-09 NOTE — Nurses Notes (Addendum)
Per service, holding sliding scale dose of humulin R and administering scheduled 8 units of humulin R.     1802: Per service, hold scheduled dose of regular insulin and only give sliding scale dose due to patient not eating a meal.

## 2021-05-09 NOTE — Care Plan (Signed)
Resting in bed alert and oriented.Fragile looking.Pt has  triple lumen PICC line,TPN infusing well.Heparin drip restarted last night ,APTT done IVF and IV antibiotics administered.Pt's incision with staples,open to air.Right JP bulb not holding suction,accordion drain has scant tan output.Wound pouch in place with scant output.Pt voids in the urinal.Pt denies any pain nausea nor vomiting.Repositioning done as tolerated.Blood sugar monitored and insulin given as per MAR.Pt is on RA sating well.Vitally stable.Fall and skin precautions observed,call light within reach at all times.Plan of care reviewed and continued.Will cont to monitor pt condition and needs.      Problem: Adult Inpatient Plan of Care  Goal: Absence of Hospital-Acquired Illness or Injury  Intervention: Identify and Manage Fall Risk  Recent Flowsheet Documentation  Taken 05/08/2021 2209 by Calvert Cantor, RN  Safety Promotion/Fall Prevention:   activity supervised   fall prevention program maintained   motion sensor pad activated   nonskid shoes/slippers when out of bed   safety round/check completed   toileting scheduled     Problem: Adult Inpatient Plan of Care  Goal: Absence of Hospital-Acquired Illness or Injury  Intervention: Prevent Skin Injury  Recent Flowsheet Documentation  Taken 05/08/2021 2356 by Calvert Cantor, RN  Body Position:   positioned with 2 assist   supine, head elevated  Taken 05/08/2021 2209 by Calvert Cantor, RN  Body Position: supine, head elevated  Skin Protection: adhesive use limited  Taken 05/08/2021 2000 by Calvert Cantor, RN  Body Position:   semi-fowlers (30-45 degrees)   positioned with 2 assist     Problem: Adult Inpatient Plan of Care  Goal: Absence of Hospital-Acquired Illness or Injury  Intervention: Prevent and Manage VTE (Venous Thromboembolism) Risk  Recent Flowsheet Documentation  Taken 05/08/2021 2209 by Calvert Cantor, RN  VTE Prevention/Management:   ambulation promoted   anticoagulant therapy initiated    dorsiflexion/plantar flexion performed

## 2021-05-09 NOTE — Nurses Notes (Signed)
Latest Reference Range & Units 05/09/21 02:31   aPTT 24.2 - 37.5 seconds 62.2 (H)    per heparin thrombotic protocol no change in rate,fro repeat APTT on 4/9 0830

## 2021-05-09 NOTE — Progress Notes (Signed)
[facility]  Surgical Oncology  Progress Note      [name] [name], 76 y.o. male  Date of Birth:  19-Dec-1945  Date of Admission:  04/03/2021  Date of service: 05/09/2021    Assessment:  This is a 76 y.o. 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 He underwent massive transfusion protocol and ex lap on 04/03/21 at an outside facility with ligation of a bleeding vessel in the liver bed. Patient was subsequently transferred to [facility] postoperaively, intubated. Patient was subsequently extubated later that day on 3/4. 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      Plan/Recommendations:  - Repeat blood cultures due to leukocytosis  - Restarted Heparin  - Follow up Endocrine recommendations  - Continue dapto/merrem/fluconazole  - Diet: CLD + TPN  - Peripancreatic fluid collections   - GI yesterday with cystogastrostomy   - Continue octreotide   - GI scope on 3/23 unable to stent PD duct, patient with pancreatic divisim              - IR consulted; RUQ drain placement and empiric embolization 3/10              - Blood cultures 3/9: + pseudomonas    - Cultures form R JP: Pseudomonas and enterococcus              - Repeat Blood cultures 3/12 negative   - ID consulted; continue Daptomycin and meropenum (stop date 4/10)     - PE   - s/p IVC filter placement 3/17   - Aggressive pulm toilet, supp off O2   - Holding heparin gtt for 48 hrs.   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 No acute concerns    Objective  Filed Vitals:    05/08/21 2022 05/08/21 2328 05/08/21 2337 05/09/21 0322   BP: 136/75   135/77 (!) 150/80   Pulse: 83  85 87   Resp: '18  18 18   '$ Temp: 37 C (98.6 F)  36.9 C (98.4 F) 36.7 C (98.1 F)   SpO2: 93% 95% 93% 94%     Physical Exam:   GEN:  AOx4, resting in bed, no acute distress  PULM: Normal respiratory effort. Equal/symmetric chest rise.  CV:  Pink, well perfused, RRR  ABD:   Abdomen soft, nontender, nondistended. Lower midline incision with feculent drainage present with ostomy pouch on top.  Pigtail catheter present with tan murky output.  MS: Atraumatic. Moves all extremities.  NEURO:   Alert and oriented to person, place and time.  Cranial nerves grossly intact.    Integumentary:  Pink, warm, and dry  PSYCHOSOCIAL: Pleasant.  Normal affect.     Labs  Results for orders placed or performed during the hospital encounter of 04/03/21 (from the past 24 hour(s))   PTT (PARTIAL THROMBOPLASTIN TIME)   Result Value Ref Range    APTT 62.2 (H) 24.2 - 37.5 seconds    Narrative    Therapeutic range for unfractionated heparin is 60-100 seconds.  BASIC METABOLIC PANEL   Result Value Ref Range    SODIUM 137 136 - 145 mmol/L    POTASSIUM 4.4 3.5 - 5.1 mmol/L    CHLORIDE 103 96 - 111 mmol/L    CO2 TOTAL 29 23 - 31 mmol/L    ANION GAP 5 4 - 13 mmol/L    CALCIUM 8.6 (L) 8.8 - 10.2 mg/dL    GLUCOSE 136 (H) 65 - 125 mg/dL    BUN 27 (H) 8 - 25 mg/dL    CREATININE 0.60 (L) 0.75 - 1.35 mg/dL    BUN/CREA RATIO 45 (H) 6 - 22    ESTIMATED GFR >90 >=60 mL/min/BSA    Narrative    Hemolysis can alter results at this level (slight).  Lipemia can alter results at this level (slight).   CBC   Result Value Ref Range    WBC 20.6 (H) 3.7 - 11.0 x10^3/uL    RBC 2.80 (L) 4.50 - 6.10 x10^6/uL    HGB 7.8 (L) 13.4 - 17.5 g/dL    HCT 25.3 (L) 38.9 - 52.0 %    MCV 90.4 78.0 - 100.0 fL    MCH 27.9 26.0 - 32.0 pg    MCHC 30.8 (L) 31.0 - 35.5 g/dL    RDW-CV 17.4 (H) 11.5 - 15.5 %    PLATELETS 511 (H) 150 - 400 x10^3/uL    MPV 12.0 8.7 - 12.5 fL   MAGNESIUM   Result Value Ref Range    MAGNESIUM 1.9 1.8 - 2.6 mg/dL    Narrative     Hemolysis can alter results at this level (slight).  Lipemia can alter results at this level (slight).   PHOSPHORUS   Result Value Ref Range    PHOSPHORUS 2.0 (L) 2.3 - 4.0 mg/dL    Narrative    Hemolysis can alter results at this level (slight).   POC BLOOD GLUCOSE (RESULTS)   Result Value Ref Range    GLUCOSE, POC 209 (H) 70 - 105 mg/dl   POC BLOOD GLUCOSE (RESULTS)   Result Value Ref Range    GLUCOSE, POC 197 (H) 70 - 105 mg/dl   POC BLOOD GLUCOSE (RESULTS)   Result Value Ref Range    GLUCOSE, POC 201 (H) 70 - 105 mg/dl   POC BLOOD GLUCOSE (RESULTS)   Result Value Ref Range    GLUCOSE, POC 146 (H) 70 - 105 mg/dl   POC BLOOD GLUCOSE (RESULTS)   Result Value Ref Range    GLUCOSE, POC 143 (H) 70 - 105 mg/dl   POC BLOOD GLUCOSE (RESULTS)   Result Value Ref Range    GLUCOSE, POC 183 (H) 70 - 105 mg/dl       I/O:  Date 05/08/21 0700 - 05/09/21 0659 05/09/21 0700 - 05/10/21 0659   Shift 0700-1459 1500-2259 2300-0659 24 Hour Total 0700-1459 1500-2259 2300-0659 24 Hour Total   INTAKE   P.O. 320 150 400 870         Oral 320 150 400 870       I.V.(mL/kg/hr) 0762(2.63) 3354(5.62) 765 3330         Volume Infused (LR premix infusion)  135 225 360         Volume Infused (adult custom parenteral nutrition) 1482 678  2160         Volume Infused (adult custom parenteral nutrition)  270 540 810       IV Piggyback  66  66  Volume (DAPTOmycin (CUBICIN) 800 mg in NS 50 mL IVPB)  66  66       Shift Total(mL/kg) 1802(25.1) 5409(81.19) 1478(29.56) 2130(86.57)       OUTPUT   Urine(mL/kg/hr) 840(1.46) 1430(2.48) 2000 4270         Urine (Voided) 840 1430 2000 4270       Drains 88 0 15 103         JP Drain Output ([place] Drain Right;Upper Abdomen) 23 0 5 28         Drain Output (Drain (Miscellaneous) #37F pigtail catheter for RLQ fluid collection Lower;Right;Anterior Abdomen) 55 0 0 55         Drain Output (Drain (Miscellaneous) peds ostomy bag Lower;Medial Abdomen) 10 0 10 20       Shift Total(mL/kg) 846(96.29) 5284(13.24)  4010(27.25) 3664(40.34)       Weight (kg) 71.8 72.1 72.1 72.1 72.1 72.1 72.1 72.1       Radiology:  None    Current Medications:  Current Facility-Administered Medications   Medication Dose Route Frequency   . acetaminophen (TYLENOL) tablet  500 mg Oral Q4H PRN   . adult custom parenteral nutrition   Intravenous Continuous   . atorvastatin (LIPITOR) tablet  40 mg Oral Daily with Breakfast   . benzonatate (TESSALON) capsule  100 mg Oral Q8H PRN   . D5W 250 mL flush bag   Intravenous Q15 Min PRN   . DAPTOmycin (CUBICIN) 800 mg in NS 50 mL IVPB  10 mg/kg (Adjusted) Intravenous Q24H   . [Held by provider] docusate sodium (COLACE) capsule  100 mg Oral 2x/day   . elvitegravir-cobicistat-emtricitrabine-tenofovir (GENVOYA) 150-150-200-10 mg per tablet  1 Tablet Oral Daily   . fluconazole (DIFLUCAN) tablet  400 mg Oral Daily   . gabapentin (NEURONTIN) capsule  300 mg Oral 3x/day   . heparin 25,000 units in NS 250 mL infusion  18 Units/kg/hr (Adjusted) Intravenous Continuous   . insulin glargine-yfgn 100 units/mL injection  10 Units Subcutaneous NIGHTLY   . insulin NPH human 100 units/mL injection  4 Units Subcutaneous 2x/day   . insulin R human 100 units/mL injection  8 Units Subcutaneous Q6H   . LR premix infusion   Intravenous Continuous   . melatonin tablet  6 mg Oral NIGHTLY   . meropenem (MERREM) 1 g in NS 100 mL IVPB  1 g Intravenous Q8H   . methocarbamol (ROBAXIN) tablet  500 mg Oral 4x/day   . multivitamin-minerals-folic acid-lycopene-lutein (CERTAVITE SENIOR) tablet  1 Tablet Oral Q24H   . NS 250 mL flush bag   Intravenous Q15 Min PRN   . NS flush syringe  10-30 mL Intracatheter Q8HRS   . NS flush syringe  20-30 mL Intracatheter Q1 MIN PRN   . NS flush syringe  2-6 mL Intracatheter Q8HRS   . NS flush syringe  2-6 mL Intracatheter Q1 MIN PRN   . ondansetron (ZOFRAN) 2 mg/mL injection  4 mg Intravenous Q6H PRN   . oxyCODONE (ROXICODONE) immediate release tablet  2.5 mg Oral Q4H PRN   . oxyCODONE (ROXICODONE) immediate  release tablet  5 mg Oral Q4H PRN   . [Held by provider] pantoprazole (PROTONIX) delayed release tablet  40 mg Oral Q12H   . potassium & sodium phosphate (K PHOS NEUTRAL) tablet  250 mg Oral 4x/day PC   . prochlorperazine (COMPAZINE) 5 mg/mL injection  10 mg Intravenous Q6H PRN   . [Held by provider] sennosides-docusate sodium (SENOKOT-S) 8.6-'50mg'$  per tablet  1  Tablet Oral 2x/day   . SSIP insulin R human (HUMULIN R) 100 units/mL injection  0-12 Units Subcutaneous Q6H PRN       Micheline Chapman, MD  Department of Family Medicine, PGY-1  Pager: [phone]

## 2021-05-09 NOTE — Consults (Signed)
[facility]  Endocrinology Consult  Follow Up Note    [name], [name], 76 y.o. male  Date of Service: 05/09/2021  Date of Birth:  [birth year]    Hospital Day:  LOS: 36 days     Reason for Consult: : Diabetes management     [name] is a 76 y.o. 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      Subjective:   Patient examined at bedside with wife present  , has been able to tolerate clear liquids however has not advanced any further . Says he would likely be going home on TPN as well and the surgical team has told him not worry about eating at this time .      Current Facility-Administered Medications   Medication Dose Route Frequency Provider Last Rate Last Admin    acetaminophen (TYLENOL) tablet  500 mg Oral Q4H PRN Donna Christen, MD   500 mg at 05/05/21 1306    adult custom parenteral nutrition   Intravenous Continuous Nicola Police, MD 90 mL/hr at 05/09/21 0716 Solution Verified at 05/09/21 0716    adult custom parenteral nutrition   Intravenous Continuous Nicola Police, MD        atorvastatin (LIPITOR) tablet  40 mg Oral Daily with Breakfast Nicola Police, MD   40 mg at 05/09/21 8592    benzonatate (TESSALON) capsule  100 mg Oral Q8H PRN [name], MD        D5W 250 mL flush bag   Intravenous Q15 Min PRN Modena Jansky, DO        DAPTOmycin (CUBICIN) 800 mg in NS 50 mL IVPB  10 mg/kg (Adjusted) Intravenous Q24H Cosner, Adam, PA-C   Stopped at 05/08/21 1537    [Held by provider] docusate sodium (COLACE) capsule  100 mg Oral 2x/day Cosner, Adam, PA-C   100 mg at 04/26/21 9244    elvitegravir-cobicistat-emtricitrabine-tenofovir (GENVOYA) 150-150-200-10 mg per tablet  1 Tablet Oral Daily Monia Pouch, DO   1 Tablet at 05/09/21 6286    fluconazole (DIFLUCAN) tablet  400 mg Oral Daily Cosner, Adam, PA-C   400 mg at 05/09/21 3817    gabapentin (NEURONTIN) capsule  300 mg Oral 3x/day Ranae Palms, DO   300 mg at 05/09/21 0854    heparin 25,000 units in NS 250 mL infusion  18 Units/kg/hr (Adjusted) Intravenous Continuous Lucile Crater, MD 13.4 mL/hr at 05/09/21 0716 18 Units/kg/hr at 05/09/21 0716    insulin glargine-yfgn 100 units/mL injection  10 Units Subcutaneous NIGHTLY Shafiq, Ramsha, MD   10 Units at 05/08/21 2147    insulin NPH human 100 units/mL injection  5 Units Subcutaneous 2x/day Sadiq, Mehjabeen, MD        insulin R human 100 units/mL injection  8 Units Subcutaneous Q6H Shafiq, Ramsha, MD   8 Units at 05/09/21 1214    LR premix infusion   Intravenous Continuous Mariea Clonts, MD 45 mL/hr at 05/08/21 1845 New Bag at 05/08/21 1845    melatonin tablet  6 mg Oral NIGHTLY Croley, Mallory, MD   6 mg at 05/08/21 2120    meropenem (MERREM) 1 g in NS 100 mL IVPB  1 g Intravenous Q8H Cosner, Adam, PA-C 240 mL/hr at  05/09/21 1222 1 g at 05/09/21 1222    methocarbamol (ROBAXIN) tablet  500 mg Oral 4x/day Larna Daughters, MD   500 mg at 05/09/21 1610    multivitamin-minerals-folic acid-lycopene-lutein (CERTAVITE SENIOR) tablet  1 Tablet Oral Q24H Cosner, Adam, PA-C   1 Tablet at 05/08/21 1253    NS 250 mL flush bag   Intravenous Q15 Min PRN Modena Jansky, DO        NS flush syringe  10-30 mL Intracatheter Q8HRS Nicola Police, MD   20 mL at 05/09/21 0337    NS flush syringe  20-30 mL Intracatheter Q1 MIN PRN Nicola Police, MD        NS flush syringe  2-6 mL Intracatheter Q8HRS Modena Jansky, DO   6 mL at 05/09/21 9604    NS flush syringe  2-6 mL Intracatheter Q1 MIN PRN Modena Jansky, DO        ondansetron [facility]) 2 mg/mL injection  4 mg Intravenous Q6H PRN Ranae Palms, DO   4 mg at 05/09/21 0503    oxyCODONE (ROXICODONE) immediate release tablet  2.5 mg Oral  Q4H PRN Stacey Drain, DO   2.5 mg at 05/05/21 1613    oxyCODONE (ROXICODONE) immediate release tablet  5 mg Oral Q4H PRN Stacey Drain, DO   5 mg at 05/09/21 0725    [Held by provider] pantoprazole (PROTONIX) delayed release tablet  40 mg Oral Q12H [name], MD   40 mg at 05/08/21 5409    prochlorperazine (COMPAZINE) 5 mg/mL injection  10 mg Intravenous Q6H PRN Stacey Drain, DO   10 mg at 05/09/21 0725    [Held by provider] sennosides-docusate sodium (SENOKOT-S) 8.6-'50mg'$  per tablet  1 Tablet Oral 2x/day Cosner, Adam, PA-C   1 Tablet at 04/26/21 2148    SSIP insulin R human (HUMULIN R) 100 units/mL injection  0-12 Units Subcutaneous Q6H PRN Sadiq, Mehjabeen, MD         Objective:  Temperature: 37.3 C (99.1 F)  Heart Rate: 90  BP (Non-Invasive): 134/79  Respiratory Rate: 18  SpO2: 91 %  General: Chronically ill appearing male in no acute distress.  HEENT: Head normocephalic, atraumatic. Conjunctiva clear. No proptosis or lid lag. Oral mucosa pink and moist.   Thyroid/Neck: No thyromegaly. Trachea midline.  Lungs: Normal respiratory effort.   Cardiac: Regular rate and rhythm.  Abdomen: Soft, non-tender, non-distended. Ostomy in place   Extremities: No edema, erythema, warmth, or tenderness.  Skin: Warm and dry. No visible rashes.  Neuro: Alert and oriented x 3. Speech fluent. No tremors.   Psychiatric: Normal mood and affect.      Lab Results   Component Value Date    WBC 20.6 (H) 05/09/2021    RBC 2.80 (L) 05/09/2021    HGB 7.8 (L) 05/09/2021    HCT 25.3 (L) 05/09/2021    MCV 90.4 05/09/2021    MCH 27.9 05/09/2021    MCHC 30.8 (L) 05/09/2021     Lab Results   Component Value Date    SODIUM 137 05/09/2021    POTASSIUM 4.4 05/09/2021    CO2 29 05/09/2021    BUN 27 (H) 05/09/2021    CREATININE 0.60 (L) 05/09/2021    GFR >90 05/09/2021    CALCIUM 8.6 (L) 05/09/2021    ALBUMIN 1.7 (L) 05/03/2021    ALKPHOS 94 05/03/2021    ALT 12 05/03/2021    AST 17 05/03/2021    ANIONGAP 5 05/09/2021  Lab Results    Component Value Date    CHOLESTEROL 212 (H) 12/21/2020    HDLCHOL 44 12/21/2020    LDLCHOL 96 12/21/2020    LDLCHOLDIR 89 03/24/2020    TRIG 165 (H) 05/03/2021      Lab Results   Component Value Date    HA1C 9.3 (H) 03/17/2021         Recent Labs     05/07/21  1414 05/07/21  1814 05/07/21  2257 05/08/21  0602 05/08/21  1003 05/08/21  1359 05/08/21  1744 05/08/21  2144 05/09/21  0232 05/09/21  0603 05/09/21  1148   GLUIP 251* 238* 210* 195* 209* 197* 201* 146* 143* 183* 173*       Assessment/Recommendations:  [name] is a 76 y.o. 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 Endocrinology is consulted for management of diabetes.     Post Pancreatectomy Diabetes Mellitus  - Most recent HbA1c on 03/17/2021 was 9.3%.  - Home regimen: Lantus 11 units nightly, Novolog 4 units TID AC.   - Current inpatient regimen: Lantus 10 units nightly, Insulin NPH 4 units bid, Humulin R 8 units Q6 hours, and Humulin R moderate sliding scale 4x/day PRN.   -Diet: MNT PROTOCOL FOR DIETITIAN  DIET CLEAR LIQUID - LOW SUGAR  adult custom parenteral nutrition  adult custom parenteral nutrition   - TPN currently running at 90 mL/hr.     Fasting sugar 183 , needed 36 units in regular insulin coverage     - Recommendations:     -Fasting slightly above goal ,will increase the NPH slightly to 5 units bid   - continue with Lantus 10 units at night time -this should not be held regardless of the feeing status   -continue with insulin R 8 units every 6 hrs for TPN coverage ,patient not eating much on top of TPN , if diet is advanced will increase the dose of R   -change correctional scale of insulin R every 6 hrs as needed from moderate to conseravtive    Thank you for the consult. Endocrine will  follow.          Mehjabeen Sadiq  Endocrine fellow    05/09/2021, 12:43  Department of Endocrine, Diabetes and metabolism     I saw and examined the patient. I reviewed the fellow's note. I agree with the findings and plan of care as documented in the fellow's note. Any exceptions/additions are edited/noted.      Keith Rake, MD, MPH 05/09/2021, 20:00

## 2021-05-10 ENCOUNTER — Inpatient Hospital Stay (HOSPITAL_COMMUNITY): Payer: 59 | Admitting: Radiology

## 2021-05-10 DIAGNOSIS — K3189 Other diseases of stomach and duodenum: Secondary | ICD-10-CM

## 2021-05-10 DIAGNOSIS — R935 Abnormal findings on diagnostic imaging of other abdominal regions, including retroperitoneum: Secondary | ICD-10-CM

## 2021-05-10 DIAGNOSIS — Z980 Intestinal bypass and anastomosis status: Secondary | ICD-10-CM

## 2021-05-10 DIAGNOSIS — N3289 Other specified disorders of bladder: Secondary | ICD-10-CM

## 2021-05-10 DIAGNOSIS — J9811 Atelectasis: Secondary | ICD-10-CM

## 2021-05-10 DIAGNOSIS — Z90411 Acquired partial absence of pancreas: Secondary | ICD-10-CM

## 2021-05-10 DIAGNOSIS — R9389 Abnormal findings on diagnostic imaging of other specified body structures: Secondary | ICD-10-CM

## 2021-05-10 DIAGNOSIS — K5730 Diverticulosis of large intestine without perforation or abscess without bleeding: Secondary | ICD-10-CM

## 2021-05-10 DIAGNOSIS — Z9081 Acquired absence of spleen: Secondary | ICD-10-CM

## 2021-05-10 DIAGNOSIS — Z9049 Acquired absence of other specified parts of digestive tract: Secondary | ICD-10-CM

## 2021-05-10 DIAGNOSIS — Z978 Presence of other specified devices: Secondary | ICD-10-CM

## 2021-05-10 DIAGNOSIS — J90 Pleural effusion, not elsewhere classified: Secondary | ICD-10-CM

## 2021-05-10 LAB — HEPATIC FUNCTION PANEL
ALBUMIN: 2 g/dL — ABNORMAL LOW (ref 3.4–4.8)
ALKALINE PHOSPHATASE: 116 U/L — ABNORMAL HIGH (ref 45–115)
ALT (SGPT): 27 U/L (ref 10–55)
AST (SGOT): 32 U/L (ref 8–45)
BILIRUBIN DIRECT: 0.1 mg/dL (ref 0.1–0.4)
BILIRUBIN TOTAL: 0.2 mg/dL — ABNORMAL LOW (ref 0.3–1.3)
PROTEIN TOTAL: 6.6 g/dL (ref 6.0–8.0)

## 2021-05-10 LAB — URINALYSIS, MACROSCOPIC
BILIRUBIN: NEGATIVE mg/dL
BLOOD: NEGATIVE mg/dL
COLOR: NORMAL
GLUCOSE: 200 mg/dL — AB
KETONES: NEGATIVE mg/dL
LEUKOCYTES: NEGATIVE WBCs/uL
NITRITE: NEGATIVE
PH: 7 (ref 5.0–8.0)
PROTEIN: NEGATIVE mg/dL
SPECIFIC GRAVITY: 1.017 (ref 1.005–1.030)
UROBILINOGEN: NEGATIVE mg/dL

## 2021-05-10 LAB — CBC
HCT: 26 % — ABNORMAL LOW (ref 38.9–52.0)
HGB: 8.1 g/dL — ABNORMAL LOW (ref 13.4–17.5)
MCH: 27.8 pg (ref 26.0–32.0)
MCHC: 31.2 g/dL (ref 31.0–35.5)
MCV: 89.3 fL (ref 78.0–100.0)
MPV: 12 fL (ref 8.7–12.5)
PLATELETS: 548 10*3/uL — ABNORMAL HIGH (ref 150–400)
RBC: 2.91 10*6/uL — ABNORMAL LOW (ref 4.50–6.10)
RDW-CV: 17.7 % — ABNORMAL HIGH (ref 11.5–15.5)
WBC: 22.8 10*3/uL — ABNORMAL HIGH (ref 3.7–11.0)

## 2021-05-10 LAB — BASIC METABOLIC PANEL
ANION GAP: 5 mmol/L (ref 4–13)
BUN/CREA RATIO: 33 — ABNORMAL HIGH (ref 6–22)
BUN: 24 mg/dL (ref 8–25)
CALCIUM: 8.7 mg/dL — ABNORMAL LOW (ref 8.8–10.2)
CHLORIDE: 102 mmol/L (ref 96–111)
CO2 TOTAL: 27 mmol/L (ref 23–31)
CREATININE: 0.72 mg/dL — ABNORMAL LOW (ref 0.75–1.35)
ESTIMATED GFR: >90 mL/min/BSA (ref >=60)
GLUCOSE: 180 mg/dL — ABNORMAL HIGH (ref 65–125)
POTASSIUM: 4 mmol/L (ref 3.5–5.1)
SODIUM: 134 mmol/L — ABNORMAL LOW (ref 136–145)

## 2021-05-10 LAB — POC BLOOD GLUCOSE (RESULTS)
GLUCOSE, POC: 171 mg/dl — ABNORMAL HIGH (ref 70–105)
GLUCOSE, POC: 188 mg/dl — ABNORMAL HIGH (ref 70–105)
GLUCOSE, POC: 200 mg/dl — ABNORMAL HIGH (ref 70–105)

## 2021-05-10 LAB — TRIGLYCERIDE: TRIGLYCERIDES: 267 mg/dL — ABNORMAL HIGH (ref <150)

## 2021-05-10 LAB — MAGNESIUM: MAGNESIUM: 1.9 mg/dL (ref 1.8–2.6)

## 2021-05-10 LAB — ADULT ROUTINE BLOOD CULTURE, SET OF 2 BOTTLES (BACTERIA AND YEAST)
BLOOD CULTURE, ROUTINE: NO GROWTH
BLOOD CULTURE, ROUTINE: NO GROWTH

## 2021-05-10 LAB — PHOSPHORUS: PHOSPHORUS: 2.2 mg/dL — ABNORMAL LOW (ref 2.3–4.0)

## 2021-05-10 LAB — URINALYSIS, MICROSCOPIC
RBCS: 0 /hpf (ref <6.0)
WBCS: 0 /hpf (ref <4.0)

## 2021-05-10 LAB — C-REACTIVE PROTEIN(CRP),INFLAMMATION: CRP INFLAMMATION: 90.8 mg/L — ABNORMAL HIGH (ref <8.0)

## 2021-05-10 LAB — PTT (PARTIAL THROMBOPLASTIN TIME)
APTT: 53.9 seconds — ABNORMAL HIGH (ref 24.2–37.5)
APTT: 67.9 seconds — ABNORMAL HIGH (ref 24.2–37.5)
APTT: 63.3 seconds — ABNORMAL HIGH (ref 24.2–37.5)

## 2021-05-10 LAB — CREATINE KINASE (CK), TOTAL, SERUM: CREATINE KINASE: 12 U/L — ABNORMAL LOW (ref 45–225)

## 2021-05-10 MED ORDER — SODIUM PHOSPHATE 3 MMOL/ML INTRAVENOUS SOLUTION
30.0000 mmol | Freq: Once | INTRAVENOUS | Status: AC
Start: 2021-05-10 — End: 2021-05-10
  Administered 2021-05-10: 0 mmol via INTRAVENOUS
  Administered 2021-05-10: 30 mmol via INTRAVENOUS
  Filled 2021-05-10: qty 10

## 2021-05-10 MED ORDER — IOPAMIDOL 370 MG IODINE/ML (76 %) INTRAVENOUS SOLUTION
78.0000 mL | INTRAVENOUS | Status: AC
Start: 2021-05-10 — End: 2021-05-10
  Administered 2021-05-10: 78 mL via INTRAVENOUS

## 2021-05-10 MED ORDER — HEPARIN (PORCINE) 5,000 UNITS/ML BOLUS FOR DOSE ADJUSTMENT
25.0000 [IU]/kg | Freq: Once | INTRAMUSCULAR | Status: AC
Start: 2021-05-10 — End: 2021-05-10
  Administered 2021-05-10: 2000 [IU] via INTRAVENOUS
  Filled 2021-05-10: qty 1

## 2021-05-10 MED ORDER — WATER FOR INJECTION, STERILE INTRAVENOUS SOLUTION
INTRAVENOUS | Status: AC
Start: 2021-05-10 — End: 2021-05-11
  Filled 2021-05-10: qty 1300

## 2021-05-10 NOTE — Care Plan (Signed)
Patient admitted for intra abdominal hemorrhage. Patient currently on Heparin drip. Patient has had 4 consecutive PTT's within range. Continues on TPN and IV antibiotics as well. Glycemic control managed affectively with scheduled and PRN medications. Tolerated clear liquid diet without nausea/vomiting. No pain reported.

## 2021-05-10 NOTE — Consults (Signed)
[facility]  Endocrinology Consult  Follow Up Note    [name], 76 y.o. male  Date of Service: 05/10/2021  Date of Birth:  Nov 25, 1945    Hospital [name]:  LOS: 37 days     Reason for Consult: : Diabetes management     [name] is a 75 y.o. 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      Subjective:   Patient examined at bedside , resting in no apparent distress  Has been noted to have worsening leucocytosis  and underwent CT abdomen which showed interval decrease in size of collections   Continues to be on TPN       Current Facility-Administered Medications   Medication Dose Route Frequency Provider Last Rate Last Admin    acetaminophen (TYLENOL) tablet  500 mg Oral Q4H PRN Donna Christen, MD   500 mg at 05/05/21 1306    adult custom parenteral nutrition   Intravenous Continuous Nicola Police, MD 90 mL/hr at 05/10/21 0716 Solution Verified at 05/10/21 0716    atorvastatin (LIPITOR) tablet  40 mg Oral Daily with Breakfast Nicola Police, MD   40 mg at 05/10/21 0845    benzonatate (TESSALON) capsule  100 mg Oral Q8H PRN [name], MD        D5W 250 mL flush bag   Intravenous Q15 Min PRN Modena Jansky, DO        [Held by provider] docusate sodium (COLACE) capsule  100 mg Oral 2x/[name] Cosner, Adam, PA-C   100 mg at 04/26/21 5035    elvitegravir-cobicistat-emtricitrabine-tenofovir (GENVOYA) 150-150-200-10 mg per tablet  1 Tablet Oral Daily Monia Pouch, DO   1 Tablet at 05/10/21 0602    gabapentin (NEURONTIN) capsule  300 mg Oral 3x/[name] Ranae Palms, DO   300 mg at 05/10/21 0845    heparin 25,000 units in NS 250 mL infusion  18 Units/kg/hr (Adjusted) Intravenous Continuous Lucile Crater, MD  14.1 mL/hr at 05/10/21 0715 19 Units/kg/hr at 05/10/21 0715    insulin glargine-yfgn 100 units/mL injection  10 Units Subcutaneous NIGHTLY Shafiq, Ramsha, MD   10 Units at 05/09/21 2133    insulin NPH human 100 units/mL injection  5 Units Subcutaneous 2x/[name] Malena Peer, MD   5 Units at 05/10/21 0845    insulin R human 100 units/mL injection  8 Units Subcutaneous Q6H Shafiq, Christeen Douglas, MD   8 Units at 05/10/21 0537    LR premix infusion   Intravenous Continuous Mariea Clonts, MD 45 mL/hr at 05/09/21 1949 New Bag at 05/09/21 1949    melatonin tablet  6 mg Oral NIGHTLY Donna Christen, MD   6 mg at 05/09/21 1942    methocarbamol (ROBAXIN) tablet  500 mg Oral 4x/[name] Larna Daughters, MD   500 mg at 05/10/21 0845    multivitamin-minerals-folic acid-lycopene-lutein (CERTAVITE SENIOR) tablet  1 Tablet Oral Q24H Cosner, Adam, PA-C   1 Tablet at 05/09/21 1401    NS 250 mL flush bag   Intravenous Q15 Min PRN Modena Jansky, DO        NS flush syringe  10-30 mL Intracatheter Q8HRS Nicola Police, MD   20 mL at 05/10/21 0600    NS flush syringe  20-30 mL  Intracatheter Q1 MIN PRN Nicola Police, MD        NS flush syringe  2-6 mL Intracatheter Q8HRS Modena Jansky, DO   6 mL at 05/10/21 0600    NS flush syringe  2-6 mL Intracatheter Q1 MIN PRN Modena Jansky, DO        ondansetron [facility]) 2 mg/mL injection  4 mg Intravenous Q6H PRN Ranae Palms, DO   4 mg at 05/09/21 0503    oxyCODONE (ROXICODONE) immediate release tablet  2.5 mg Oral Q4H PRN Stacey Drain, DO   2.5 mg at 05/05/21 1613    oxyCODONE (ROXICODONE) immediate release tablet  5 mg Oral Q4H PRN Stacey Drain, DO   5 mg at 05/09/21 0725    [Held by provider] pantoprazole (PROTONIX) delayed release tablet  40 mg Oral Q12H [name], MD   40 mg at 05/08/21 6712    prochlorperazine (COMPAZINE) 5 mg/mL injection  10 mg Intravenous Q6H PRN Stacey Drain, DO   10 mg at 05/09/21 0725    [Held by provider] sennosides-docusate sodium  (SENOKOT-S) 8.6-'50mg'$  per tablet  1 Tablet Oral 2x/[name] Cosner, Adam, PA-C   1 Tablet at 04/26/21 2148    sodium phosphate 30 mmol in D5W 100 mL IVPB  30 mmol Intravenous Once Lucile Crater, MD        SSIP insulin R human (HUMULIN R) 100 units/mL injection  0-12 Units Subcutaneous Q6H PRN Malena Peer, MD   2 Units at 05/10/21 0537     Objective:  Temperature: 36.8 C (98.2 F)  Heart Rate: 90  BP (Non-Invasive): (!) 140/77  Respiratory Rate: 16  SpO2: 94 %  General: Chronically ill appearing male in no acute distress.  HEENT: Head normocephalic, atraumatic. Conjunctiva clear. No proptosis or lid lag. Oral mucosa pink and moist.   Thyroid/Neck: No thyromegaly. Trachea midline.  Lungs: Normal respiratory effort.   Cardiac: Regular rate and rhythm.  Abdomen: Soft, non-tender, non-distended. Ostomy in place   Extremities: No edema, erythema, warmth, or tenderness.  Skin: Warm and dry. No visible rashes.  Neuro: sleepy ,no obvious focal neurologic deficit   Psychiatric: Normal mood and affect.      Lab Results   Component Value Date    WBC 22.8 (H) 05/10/2021    RBC 2.91 (L) 05/10/2021    HGB 8.1 (L) 05/10/2021    HCT 26.0 (L) 05/10/2021    MCV 89.3 05/10/2021    MCH 27.8 05/10/2021    MCHC 31.2 05/10/2021     Lab Results   Component Value Date    SODIUM 134 (L) 05/10/2021    POTASSIUM 4.0 05/10/2021    CO2 27 05/10/2021    BUN 24 05/10/2021    CREATININE 0.72 (L) 05/10/2021    GFR >90 05/10/2021    CALCIUM 8.7 (L) 05/10/2021    ALBUMIN 2.0 (L) 05/10/2021    ALKPHOS 116 (H) 05/10/2021    ALT 27 05/10/2021    AST 32 05/10/2021    ANIONGAP 5 05/10/2021     Lab Results   Component Value Date    CHOLESTEROL 212 (H) 12/21/2020    HDLCHOL 44 12/21/2020    LDLCHOL 96 12/21/2020    LDLCHOLDIR 89 03/24/2020    TRIG 267 (H) 05/10/2021      Lab Results   Component Value Date    HA1C 9.3 (H) 03/17/2021         Recent Labs     05/08/21  1359 05/08/21  1744 05/08/21  2144 05/09/21  0232 05/09/21  0603 05/09/21  1148  05/09/21  1753 05/09/21  2116 05/09/21  2344 05/10/21  0531   GLUIP 197* 201* 146* 143* 183* 173* 231* 201* 198* 171*       Assessment/Recommendations:  [name] is a 76 y.o. 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 Endocrinology is consulted for management of diabetes.     Post Pancreatectomy Diabetes Mellitus  - Most recent HbA1c on 03/17/2021 was 9.3%.  - Home regimen: Lantus 11 units nightly, Novolog 4 units TID AC.   - Current inpatient regimen: Lantus 10 units nightly, Insulin NPH 5  units bid, Humulin R 8 units Q6 hours, and Humulin R moderate sliding scale 4x/[name] PRN.   -Diet: MNT PROTOCOL FOR DIETITIAN  DIET CLEAR LIQUID - LOW SUGAR  adult custom parenteral nutrition   - TPN currently running at 90 mL/hr.     Patient continues to have poor oral intake   Fasting sugar 171 at goal     - Recommendations:     -Fasting at goal ,continue with NPH 5 unit bid   - continue with Lantus 10 units at night time -this should not be held regardless of the feeing status   -continue with insulin R 8 units every 6 hrs for TPN coverage ,patient not eating much on top of TPN , if diet is advanced will increase the dose of R   -continue with conservative  correctional scale of insulin R every 6 hrs as needed   Thank you for the consult. Endocrine will follow.          [name]  Endocrine fellow    05/10/2021  Department of Endocrine, Diabetes and metabolism         I saw and examined the patient. I reviewed the fellow's note. I agree with the findings and plan of care as documented in the fellow's note. Any exceptions/additions are edited/noted.    [name] Rake, MD, MPH 05/10/2021, 19:09

## 2021-05-10 NOTE — Anesthesia Postprocedure Evaluation (Signed)
Anesthesia Post Op Evaluation    Patient: [name]  Procedure(s):  ENDOSCOPIC U/S WITH CYST GASTROSTOMY    Last Vitals:Temperature: 36.8 C (98.2 F) (05/10/21 0809)  Heart Rate: 90 (05/10/21 0809)  BP (Non-Invasive): (!) 140/77 (05/10/21 0809)  Respiratory Rate: 16 (05/10/21 0809)  SpO2: 94 % (05/10/21 0809)    No notable events documented.    Patient is sufficiently recovered from the effects of anesthesia to participate in the evaluation and has returned to their pre-procedure level.  Patient location during evaluation: PACU       Patient participation: complete - patient participated  Level of consciousness: awake and alert and responsive to verbal stimuli    Pain management: adequate  Airway patency: patent    Anesthetic complications: no  Cardiovascular status: acceptable  Respiratory status: acceptable  Hydration status: acceptable  Patient post-procedure temperature: Pt Normothermic   PONV Status: Absent  Comments: Late entry

## 2021-05-10 NOTE — Progress Notes (Signed)
[facility]  Surgical Oncology  Progress Note      Raudel, Bazen, 76 y.o. male  Date of Birth:  Jun 02, 1945  Date of Admission:  04/03/2021  Date of service: 05/10/2021    Assessment:  This is a 76 y.o. 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 He underwent massive transfusion protocol and ex lap on 04/03/21 at an outside facility with ligation of a bleeding vessel in the liver bed. Patient was subsequently transferred to [facility] postoperaively, intubated. Patient was subsequently extubated later that day on 3/4. 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    Plan/Recommendations:  - Repeat blood cultures 4/9 due to leukocytosis  - Continue heparin gtt  - Follow up Endocrine recommendations  - Continue dapto/merrem/fluconazole  - Diet: CLD + TPN  - Peripancreatic fluid collections   - Advanced GI performed EUS with cystogastrostomy 4/6              - Blood cultures 3/9: + pseudomonas    - Cultures form R JP: Pseudomonas and enterococcus              - Repeat Blood cultures 3/12 negative   - ID consulted; continue Daptomycin and meropenum (stop date 4/10)     - PE   - s/p IVC filter placement 3/17   - Aggressive pulm toilet, supp off O2.   - IVF: TPN 26m/hr  - Midline fistula with ostomy bag to monitor output  - OOB TID, encourage ambulation  -Pulmonary toilet, keep IS accessible at bedside  - PT/OT ordered, encourage pt to work with therapy  - Dispo: Floor, CT scan today to further evaluate leukocytosis      Subjective:   Patient resting comfortably in bed. Worsening leukocytosis today. No new subjective complaints.     Objective  Filed Vitals:    05/09/21 2355 05/10/21 0417 05/10/21 0809 05/10/21 1125   BP: 129/75 134/81 (!) 140/77 133/74   Pulse: 85 81 90 86   Resp: '16 16 16 16   '$ Temp: 37 C (98.6 F) 36 C  (96.8 F) 36.8 C (98.2 F) 36.7 C (98 F)   SpO2: 92% 91% 94% 93%     Physical Exam:   GEN:  AOx4, resting in bed, no acute distress  PULM: Normal respiratory effort. Equal/symmetric chest rise.  CV:  Pink, well perfused, RRR  ABD:   Abdomen soft, nontender, nondistended. Lower midline incision with feculent drainage present with ostomy pouch on top.  Pigtail catheter present with tan murky output.  MS: Atraumatic. Moves all extremities.  NEURO:   Alert and oriented to person, place and time.  Cranial nerves grossly intact.    Integumentary:  Pink, warm, and dry  PSYCHOSOCIAL: Pleasant.  Normal affect.     Labs  Results for orders placed or performed during the hospital encounter of 04/03/21 (from the past 24 hour(s))   PTT (PARTIAL THROMBOPLASTIN TIME) - ONCE   Result Value Ref Range    APTT 63.8 (H) 24.2 - 37.5 seconds    Narrative    Therapeutic range for unfractionated heparin is 60-100 seconds.   PTT (PARTIAL THROMBOPLASTIN TIME) - ONCE   Result Value Ref Range    APTT 67.0 (H) 24.2 - 37.5 seconds    Narrative    Therapeutic range for unfractionated  heparin is 60-100 seconds.   CREATINE KINASE (CK), TOTAL, SERUM   Result Value Ref Range    CREATINE KINASE 12 (L) 45 - 225 U/L    Narrative    Hemolysis can alter results at this level (slight).  Lipemia alters results at this level (mild). Repeat testing with new specimen suggested.   HEPATIC FUNCTION PANEL   Result Value Ref Range    ALBUMIN 2.0 (L) 3.4 - 4.8 g/dL     ALKALINE PHOSPHATASE 116 (H) 45 - 115 U/L    ALT (SGPT) 27 10 - 55 U/L    AST (SGOT)  32 8 - 45 U/L    BILIRUBIN TOTAL 0.2 (L) 0.3 - 1.3 mg/dL    BILIRUBIN DIRECT 0.1 0.1 - 0.4 mg/dL    PROTEIN TOTAL 6.6 6.0 - 8.0 g/dL    Narrative    Lipemia alters results at this level (mild). Repeat testing with new specimen suggested.  Lipemia alters results at this level (mild). Repeat testing with new specimen suggested.  Lipemia can alter results at this level (mild).  Lipemia can alter results at this  level (mild).  Hemolysis can alter results at this level (slight).  Lipemia can alter results at this level (mild).   C-REACTIVE PROTEIN(CRP),INFLAMMATION   Result Value Ref Range    CRP INFLAMMATION 90.8 (H) <8.0 mg/L   BASIC METABOLIC PANEL   Result Value Ref Range    SODIUM 134 (L) 136 - 145 mmol/L    POTASSIUM 4.0 3.5 - 5.1 mmol/L    CHLORIDE 102 96 - 111 mmol/L    CO2 TOTAL 27 23 - 31 mmol/L    ANION GAP 5 4 - 13 mmol/L    CALCIUM 8.7 (L) 8.8 - 10.2 mg/dL    GLUCOSE 180 (H) 65 - 125 mg/dL    BUN 24 8 - 25 mg/dL    CREATININE 0.72 (L) 0.75 - 1.35 mg/dL    BUN/CREA RATIO 33 (H) 6 - 22    ESTIMATED GFR >90 >=60 mL/min/BSA    Narrative    Hemolysis can alter results at this level (slight).  Lipemia can alter results at this level (mild).  Lipemia alters results at this level (mild). Repeat testing with new specimen suggested.   CBC   Result Value Ref Range    WBC 22.8 (H) 3.7 - 11.0 x10^3/uL    RBC 2.91 (L) 4.50 - 6.10 x10^6/uL    HGB 8.1 (L) 13.4 - 17.5 g/dL    HCT 26.0 (L) 38.9 - 52.0 %    MCV 89.3 78.0 - 100.0 fL    MCH 27.8 26.0 - 32.0 pg    MCHC 31.2 31.0 - 35.5 g/dL    RDW-CV 17.7 (H) 11.5 - 15.5 %    PLATELETS 548 (H) 150 - 400 x10^3/uL    MPV 12.0 8.7 - 12.5 fL   MAGNESIUM   Result Value Ref Range    MAGNESIUM 1.9 1.8 - 2.6 mg/dL    Narrative    Hemolysis can alter results at this level (slight).  Lipemia alters results at this level (mild). Repeat testing with new specimen suggested.   PHOSPHORUS   Result Value Ref Range    PHOSPHORUS 2.2 (L) 2.3 - 4.0 mg/dL    Narrative    Hemolysis can alter results at this level (slight).   PTT (PARTIAL THROMBOPLASTIN TIME) - ONCE   Result Value Ref Range    APTT 53.9 (H) 24.2 - 37.5 seconds    Narrative  Therapeutic range for unfractionated heparin is 60-100 seconds.   TRIGLYCERIDE   Result Value Ref Range    TRIGLYCERIDES 267 (H) <150 mg/dL   POC BLOOD GLUCOSE (RESULTS)   Result Value Ref Range    GLUCOSE, POC 231 (H) 70 - 105 mg/dl   POC BLOOD GLUCOSE (RESULTS)    Result Value Ref Range    GLUCOSE, POC 201 (H) 70 - 105 mg/dl   POC BLOOD GLUCOSE (RESULTS)   Result Value Ref Range    GLUCOSE, POC 198 (H) 70 - 105 mg/dl   POC BLOOD GLUCOSE (RESULTS)   Result Value Ref Range    GLUCOSE, POC 171 (H) 70 - 105 mg/dl   POC BLOOD GLUCOSE (RESULTS)   Result Value Ref Range    GLUCOSE, POC 188 (H) 70 - 105 mg/dl       I/O:  Date 05/09/21 0700 - 05/10/21 0659 05/10/21 0700 - 05/11/21 0659   Shift 0700-1459 1500-2259 2300-0659 24 Hour Total 0700-1459 1500-2259 2300-0659 24 Hour Total   INTAKE   P.O. 350 220  570         Oral 350 220  570       I.V.(mL/kg/hr)  1315.5(2.28)  1315.5(0.76)         Volume Infused (adult custom parenteral nutrition)  1315.5  1315.5       IV Piggyback 50   50         Volume (DAPTOmycin (CUBICIN) 800 mg in NS 50 mL IVPB) 50   50       Shift Total(mL/kg) 400(5.55) 1535.5(21.3)  1935.5(26.84)       OUTPUT   Urine(mL/kg/hr) 690(1.2) 1085(1.88) 650(1.13) 2425(1.4) 1000   1000     Urine (Voided) 690 1085 650 2425 1000   1000     Urine Occurrence   1 x 1 x 1 x   1 x   Drains 28 25 0 53         JP Drain Output ([place] Drain Right;Upper Abdomen) 0 0 0 0         Drain Output (Drain (Miscellaneous) #82F pigtail catheter for RLQ fluid collection Lower;Right;Anterior Abdomen) 8 5 0 13         Drain Output (Drain (Miscellaneous) peds ostomy bag Lower;Medial Abdomen) 20 20  40       Stool             Stool Occurrence     0 x   0 x   Shift Total(mL/kg) 867(6.19) 5093(26.7) 650(9.02) 1245(80.99) 8338(25.05)   1000(13.87)   Weight (kg) 72.1 72.1 72.1 72.1 72.1 72.1 72.1 72.1       Radiology:  None    Current Medications:  Current Facility-Administered Medications   Medication Dose Route Frequency   . acetaminophen (TYLENOL) tablet  500 mg Oral Q4H PRN   . adult custom parenteral nutrition   Intravenous Continuous   . atorvastatin (LIPITOR) tablet  40 mg Oral Daily with Breakfast   . benzonatate (TESSALON) capsule  100 mg Oral Q8H PRN   . D5W 250 mL flush bag    Intravenous Q15 Min PRN   . [Held by provider] docusate sodium (COLACE) capsule  100 mg Oral 2x/day   . elvitegravir-cobicistat-emtricitrabine-tenofovir (GENVOYA) 150-150-200-10 mg per tablet  1 Tablet Oral Daily   . gabapentin (NEURONTIN) capsule  300 mg Oral 3x/day   . heparin 25,000 units in NS 250 mL infusion  18 Units/kg/hr (Adjusted) Intravenous Continuous   . insulin glargine-yfgn  100 units/mL injection  10 Units Subcutaneous NIGHTLY   . insulin NPH human 100 units/mL injection  5 Units Subcutaneous 2x/day   . insulin R human 100 units/mL injection  8 Units Subcutaneous Q6H   . LR premix infusion   Intravenous Continuous   . melatonin tablet  6 mg Oral NIGHTLY   . methocarbamol (ROBAXIN) tablet  500 mg Oral 4x/day   . multivitamin-minerals-folic acid-lycopene-lutein (CERTAVITE SENIOR) tablet  1 Tablet Oral Q24H   . NS 250 mL flush bag   Intravenous Q15 Min PRN   . NS flush syringe  10-30 mL Intracatheter Q8HRS   . NS flush syringe  20-30 mL Intracatheter Q1 MIN PRN   . NS flush syringe  2-6 mL Intracatheter Q8HRS   . NS flush syringe  2-6 mL Intracatheter Q1 MIN PRN   . ondansetron (ZOFRAN) 2 mg/mL injection  4 mg Intravenous Q6H PRN   . oxyCODONE (ROXICODONE) immediate release tablet  2.5 mg Oral Q4H PRN   . oxyCODONE (ROXICODONE) immediate release tablet  5 mg Oral Q4H PRN   . [Held by provider] pantoprazole (PROTONIX) delayed release tablet  40 mg Oral Q12H   . prochlorperazine (COMPAZINE) 5 mg/mL injection  10 mg Intravenous Q6H PRN   . [Held by provider] sennosides-docusate sodium (SENOKOT-S) 8.6-'50mg'$  per tablet  1 Tablet Oral 2x/day   . sodium phosphate 30 mmol in D5W 100 mL IVPB  30 mmol Intravenous Once   . SSIP insulin R human (HUMULIN R) 100 units/mL injection  0-12 Units Subcutaneous Q6H PRN       Jacqeline Broers E. Duanne Limerick, MD   General Surgery  Pager # [phone]

## 2021-05-10 NOTE — Nurses Notes (Addendum)
PTT of 53.9 not therapeutic per heparin thrombotic protocol. 25 unit/kg bolus administered and drip rate increased by 1 unit/kg/hr. New rate is now 19 units/kg/hr. Next PTT scheduled for 1245.

## 2021-05-10 NOTE — Care Management Notes (Signed)
[facility] Management Note    Patient Name: [name]  Date of Birth: 06/07/1945  Sex: male  Date/Time of Admission: 04/03/2021  2:01 PM  Room/Bed: 961/A  Payor: [facility] [employer] / Plan: Jolayne Panther VACCN/OPTUM / Product Type: Managed Care /    LOS: 37 days   Primary Care Providers:  [place] (General)    Admitting Diagnosis:  Intra abdominal hemorrhage [R58]    Assessment:      05/10/21 1315   Assessment Details   Assessment Type Continued Assessment   Date of Care Management Update 05/10/21   Date of Next DCP Update 05/13/21   Care Management Plan   Discharge Planning Status plan in progress   Projected Discharge Date 05/11/21   Discharge plan discussed with: Patient   CM will evaluate for rehabilitation potential yes   Patient choice offered to patient/family yes   Form for patient choice reviewed/signed and on chart no   Facility or Agency Preferences [facility]   Discharge Needs Assessment   Discharge Facility/Level of Care Needs Acute Rehab Placement/Return (not psych)(code 23)       [facility] called the [facility], they said that they are planning on approving inpatient rehab placement for the pt and they were going to fax the approval to [facility].  MSW spoke to [employer] with [facility], she said possible bed for pt at [facility] later today, pending physician approval.  MSW communicated this to service who said that pt is not d/c ready today.  MSW updated Amber with [facility].      Discharge Plan:  Acute Rehab Placement/Return (not psych) (code 63)      The patient will continue to be evaluated for developing discharge needs.     Case Manager: Joella Prince, [place]  Phone: [phone]

## 2021-05-10 NOTE — Nurses Notes (Signed)
PTT resulted at 1242 is 67.9. Per protocol, no change made. Next PTT ordered for 1830.

## 2021-05-11 DIAGNOSIS — E139 Other specified diabetes mellitus without complications: Secondary | ICD-10-CM

## 2021-05-11 DIAGNOSIS — E891 Postprocedural hypoinsulinemia: Secondary | ICD-10-CM

## 2021-05-11 LAB — BASIC METABOLIC PANEL
ANION GAP: 6 mmol/L (ref 4–13)
BUN/CREA RATIO: 36 — ABNORMAL HIGH (ref 6–22)
BUN: 25 mg/dL (ref 8–25)
CALCIUM: 8.7 mg/dL — ABNORMAL LOW (ref 8.8–10.2)
CHLORIDE: 105 mmol/L (ref 96–111)
CO2 TOTAL: 25 mmol/L (ref 23–31)
CREATININE: 0.7 mg/dL — ABNORMAL LOW (ref 0.75–1.35)
ESTIMATED GFR: >90 mL/min/BSA (ref >=60)
GLUCOSE: 143 mg/dL — ABNORMAL HIGH (ref 65–125)
POTASSIUM: 3.6 mmol/L (ref 3.5–5.1)
SODIUM: 136 mmol/L (ref 136–145)

## 2021-05-11 LAB — CBC
HCT: 24.8 % — ABNORMAL LOW (ref 38.9–52.0)
HGB: 7.6 g/dL — ABNORMAL LOW (ref 13.4–17.5)
MCH: 27.6 pg (ref 26.0–32.0)
MCHC: 30.6 g/dL — ABNORMAL LOW (ref 31.0–35.5)
MCV: 90.2 fL (ref 78.0–100.0)
MPV: 12.1 fL (ref 8.7–12.5)
PLATELETS: 537 10*3/uL — ABNORMAL HIGH (ref 150–400)
RBC: 2.75 10*6/uL — ABNORMAL LOW (ref 4.50–6.10)
RDW-CV: 17.8 % — ABNORMAL HIGH (ref 11.5–15.5)
WBC: 17 10*3/uL — ABNORMAL HIGH (ref 3.7–11.0)

## 2021-05-11 LAB — PTT (PARTIAL THROMBOPLASTIN TIME)
APTT: 53.2 seconds — ABNORMAL HIGH (ref 24.2–37.5)
APTT: 54.2 seconds — ABNORMAL HIGH (ref 24.2–37.5)
APTT: 58.3 seconds — ABNORMAL HIGH (ref 24.2–37.5)
APTT: 70.3 seconds — ABNORMAL HIGH (ref 24.2–37.5)

## 2021-05-11 LAB — POC BLOOD GLUCOSE (RESULTS)
GLUCOSE, POC: 181 mg/dl — ABNORMAL HIGH (ref 70–105)
GLUCOSE, POC: 181 mg/dl — ABNORMAL HIGH (ref 70–105)
GLUCOSE, POC: 192 mg/dl — ABNORMAL HIGH (ref 70–105)
GLUCOSE, POC: 202 mg/dl — ABNORMAL HIGH (ref 70–105)

## 2021-05-11 LAB — MAGNESIUM: MAGNESIUM: 2 mg/dL (ref 1.8–2.6)

## 2021-05-11 LAB — TRIGLYCERIDE: TRIGLYCERIDES: 199 mg/dL — ABNORMAL HIGH (ref <150)

## 2021-05-11 LAB — PHOSPHORUS: PHOSPHORUS: 3 mg/dL (ref 2.3–4.0)

## 2021-05-11 MED ORDER — HEPARIN (PORCINE) 5,000 UNITS/ML BOLUS FOR DOSE ADJUSTMENT
25.0000 [IU]/kg | INTRAMUSCULAR | Status: AC
Start: 2021-05-11 — End: 2021-05-11
  Administered 2021-05-11: 2000 [IU] via INTRAVENOUS
  Filled 2021-05-11: qty 1

## 2021-05-11 MED ORDER — HEPARIN (PORCINE) 5,000 UNITS/ML BOLUS FOR DOSE ADJUSTMENT
25.0000 [IU]/kg | Freq: Once | INTRAMUSCULAR | Status: AC
Start: 2021-05-11 — End: 2021-05-11
  Administered 2021-05-11: 2000 [IU] via INTRAVENOUS
  Filled 2021-05-11: qty 1

## 2021-05-11 MED ORDER — GABAPENTIN 300 MG CAPSULE
300.0000 mg | ORAL_CAPSULE | Freq: Three times a day (TID) | ORAL | Status: DC
Start: 2021-05-11 — End: 2021-05-28
  Administered 2021-05-11 – 2021-05-12 (×5): 300 mg via ORAL
  Administered 2021-05-13: 0 mg via ORAL
  Administered 2021-05-13: 300 mg via ORAL
  Administered 2021-05-13 – 2021-05-14 (×3): 0 mg via ORAL
  Administered 2021-05-14 – 2021-05-19 (×16): 300 mg via ORAL
  Administered 2021-05-20 (×3): 0 mg via ORAL
  Administered 2021-05-21 – 2021-05-22 (×4): 300 mg via ORAL
  Administered 2021-05-22: 0 mg via ORAL
  Administered 2021-05-22 – 2021-05-28 (×18): 300 mg via ORAL
  Filled 2021-05-11 (×47): qty 1

## 2021-05-11 MED ORDER — WATER FOR INJECTION, STERILE INTRAVENOUS SOLUTION
INTRAVENOUS | Status: AC
Start: 2021-05-11 — End: 2021-05-12
  Filled 2021-05-11: qty 1000

## 2021-05-11 MED ORDER — GABAPENTIN 300 MG CAPSULE
300.0000 mg | ORAL_CAPSULE | Freq: Two times a day (BID) | ORAL | Status: DC
Start: 2021-05-11 — End: 2021-05-11

## 2021-05-11 MED ORDER — INSULIN NPH ISOPHANE U-100 HUMAN 100 UNIT/ML SUBCUTANEOUS SUSPENSION
6.0000 [IU] | Freq: Two times a day (BID) | SUBCUTANEOUS | Status: DC
  Administered 2021-05-11 – 2021-05-12 (×2): 6 [IU] via SUBCUTANEOUS

## 2021-05-11 NOTE — Care Plan (Signed)
[place]  Physical Therapy Progress Note      Patient Name: [name]  Date of Birth: 1945-07-19  Height:  175.3 cm (5' 9.02")  Weight:  72.1 kg (158 lb 15.2 oz)  Room/Bed: 961/A  Payor: [facility] [employer] [employer] / Plan: [place] [facility] / Product Type: Managed Care /     Assessment:     [name] was alert, fatigued, and agreeable to PT tx. He had overall decreased activity tol and needed increased assistance for mobility with PT. However, he was able to initiate amb to/from toilet with nursing staff today and was pleased with that improvement in his function. He continues to demonstrate generalized weakness, decreased activity tol, impaired balance, and impaired mobility. His mobility is not currently adequate for safe d/c to home but he has good rehab potential. Rec IRF at d/c.    Discharge Needs:   Equipment Recommendation: TBD  Discharge Disposition: inpatient rehabilitation facility    JUSTIFICATION OF DISCHARGE RECOMMENDATION   Based on current diagnosis, functional performance prior to admission, and current functional performance, this patient requires continued PT services in inpatient rehabilitation facility in order to achieve significant functional improvements in these deficit areas: aerobic capacity/endurance, gait, locomotion, and balance, muscle performance.      Plan:   Continue to follow patient according to established plan of care.  The risks/benefits of therapy have been discussed with the patient/caregiver and he/she is in agreement with the established plan of care.     Subjective & Objective:        05/11/21 1643   Therapist Pager   PT Assigned/ Pager # [name] [phone]   Rehab Session   Document Type therapy progress note (daily note)   Total PT Minutes: 19   Patient Effort adequate   Symptoms Noted During/After Treatment fatigue   General Information   Patient Profile Reviewed yes   Patient/Family/Caregiver Comments/Observations Pt was alert  and agreeable to PT tx. He is pleased that he was able to amb to toilet with nursing staff and have a BM earlier today. Wife was at b/s and supportive.   Medical Lines PICC Line;TPN  (ostomy appliance for collection of fistual)   Respiratory Status room air   Existing Precautions/Restrictions fall precautions;full code;contact isolation  (clear liquid low sugar diet)   Mutuality/Individual Preferences   Individualized Care Needs monitor pain, reposition; OOB/amb with FWW and assist x 1 plus assist for lines   Pre Treatment Status   Pre Treatment Patient Status Patient supine in bed;Call light within reach;Telephone within reach   Support Present Pre Treatment  Family present;Other (See comments)  (MD)   Communication Pre Treatment  Nurse   Cognitive Assessment/Interventions   Behavior/Mood Observations alert;cooperative   Attention WNL/WFL   Follows Commands WFL   Pain Assessment   Pretreatment Pain Rating 0/10 - no pain   Posttreatment Pain Rating 0/10 - no pain   Mobility Assessment/Training   Comment Pt transferred to EOB. Stood to [facility] and ambulated to toilet. Had a BM. Dependent for hygiene care. Ambulated back to sit EOB. Completed some exer EOB then returned to supine due to fatigue.   Bed Mobility Assessment/Treatment   Bed Mobility, Assistive Device bed rails;Head of Bed Elevated   Supine-Sit Independence moderate assist (50% patient effort)   Sit to Supine, Independence moderate assist (50% patient effort)   Safety Issues decreased use of arms for pushing/pulling;decreased use of legs for bridging/pushing;impaired trunk control for bed mobility  Impairments balance impaired;endurance;strength decreased;postural control impaired   Comment Pt needed mod A to bring his trunk to upright and mod A to lift his legs back into bed.   Transfer Assessment/Treatment   Sit-Stand Independence moderate assist (50% patient effort);verbal cues required   Stand-Sit Independence minimum assist (75% patient effort);verbal  cues required   Sit-Stand-Sit, Assist Device walker, front wheeled   Toilet Transfer Independence moderate assist (50% patient effort);verbal cues required   Toilet Transfer Assist Device grab bar   Transfer Safety Issues weight-shifting ability decreased;step length decreased;balance decreased during turns   Transfer Impairments balance impaired;endurance;postural control impaired;strength decreased   Transfer Comment Pt needed mod A for initial lift of his hips for sit to stand today. Verbal cues for hand placement and sequencing   Gait Assessment/Treatment   Independence  contact guard assist   Assistive Device  walker, front wheeled   Distance in Feet 15 ft x 2   Gait Speed slow   Deviations  cadence decreased;double stance time increased;limb motion velocity decreased;step length decreased;stride length decreased;swing-to-stance ratio decreased;weight-shifting ability decreased;toe-to-floor clearance decreased   Safety Issues  step length decreased;weight-shifting ability decreased;balance decreased during turns   Impairments  balance impaired;endurance;strength decreased   Comment Gt was slower today but limited in part by confines of space. Pt declined amb in hall due to fatigue.   Balance Skill Training   Comment with UE support in standing   Sitting Balance: Static fair + balance   Sitting, Dynamic (Balance) fair - balance   Sit-to-Stand Balance poor balance   Standing Balance: Static poor + balance   Standing Balance: Dynamic poor + balance   Systems Impairment Contributing to Balance Disturbance musculoskeletal   Identified Impairments Contributing to Balance Disturbance impaired coordination;impaired postural control;decreased strength   Therapeutic Exercise/Activity   Comment Pt completed seated exer on EOB for B AP, LAQ, and hip flex ~ 10-15 reps each; cx rot and flex/ext ~ 5 reps; scapular retraction ~ 5 reps.   Post Treatment Status   Post Treatment Patient Status Patient supine in bed;Call light  within reach;Telephone within reach   Support Present Post Treatment  Family present   Communication [place] With patient;spouse   Basic Mobility Am-PAC/6Clicks Score (APPROVED Staff)   Turning in bed without bedrails 3   Lying on back to sitting on edge of flat bed 2   Moving to and from a bed to a chair 3   Standing up from chair 2   Walk in room 3   Climbing 3-5 steps with railing 2   6 Clicks Raw Score total 15   Standardized (t-scale) score 36.97   CMS 0-100% Score 50.4   CMS Modifier CK   Patient Mobility Goal (JHHLM) 4- Move to chair 3X/day   Exercise/Activity Level Performed 7- Walked 25 feet or more   Physical Therapy Clinical Impression   Assessment Mr. Radin was alert, fatigued, and agreeable to PT tx. He had overall decreased activity tol and needed increased assistance for mobility with PT. However, he was able to initiate amb to/from toilet with nursing staff today and was pleased with that improvement in his function. He continues to demonstrate generalized weakness, decreased activity tol, impaired balance, and impaired mobility. His mobility is not currently adequate for safe d/c to home but he has good rehab potential. Rec IRF at d/c.   Anticipated Equipment Needs at Discharge (PT) TBD   Anticipated  Discharge Disposition inpatient rehabilitation facility       Therapist:   Norlene Duel, [place]   Pager #: [phone]

## 2021-05-11 NOTE — Consults (Signed)
[facility]  Endocrinology Consult  Follow Up Note    [name], [name], 76 y.o. male  Date of Service: 05/11/2021  Date of Birth:  1945-12-13    Hospital Day:  LOS: 38 days     Reason for Consult: : Diabetes management     [name] is a 76 y.o. 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      Subjective:   Patient seen at bedside, resting well , has no complains . He is on clear liquid diet and drinking juices .    Current Facility-Administered Medications   Medication Dose Route Frequency Provider Last Rate Last Admin    acetaminophen (TYLENOL) tablet  500 mg Oral Q4H PRN Donna Christen, MD   500 mg at 05/05/21 1306    adult custom parenteral nutrition   Intravenous Continuous Lucile Crater, MD 90 mL/hr at 05/11/21 0738 Solution Verified at 05/11/21 0738    atorvastatin (LIPITOR) tablet  40 mg Oral Daily with Breakfast Nicola Police, MD   40 mg at 05/10/21 0845    benzonatate (TESSALON) capsule  100 mg Oral Q8H PRN [name], MD        D5W 250 mL flush bag   Intravenous Q15 Min PRN Modena Jansky, DO        [Held by provider] docusate sodium (COLACE) capsule  100 mg Oral 2x/day Cosner, Adam, PA-C   100 mg at 04/26/21 8315    elvitegravir-cobicistat-emtricitrabine-tenofovir (GENVOYA) 150-150-200-10 mg per tablet  1 Tablet Oral Daily Monia Pouch, DO   1 Tablet at 05/11/21 0631    gabapentin (NEURONTIN) capsule  300 mg Oral 3x/day Ranae Palms, DO   300 mg at 05/10/21 2127    heparin 25,000 units in NS 250 mL infusion  18 Units/kg/hr (Adjusted) Intravenous Continuous Lucile Crater, MD 14.8 mL/hr at 05/11/21 0738 20 Units/kg/hr at 05/11/21 0738    insulin glargine-yfgn 100 units/mL injection  10  Units Subcutaneous NIGHTLY Shafiq, Ramsha, MD   10 Units at 05/10/21 2127    insulin NPH human 100 units/mL injection  5 Units Subcutaneous 2x/day Malena Peer, MD   5 Units at 05/10/21 2127    insulin R human 100 units/mL injection  8 Units Subcutaneous Q6H Shafiq, Christeen Douglas, MD   8 Units at 05/11/21 0630    LR premix infusion   Intravenous Continuous Mariea Clonts, MD 45 mL/hr at 05/10/21 2008 New Bag at 05/10/21 2008    melatonin tablet  6 mg Oral NIGHTLY Donna Christen, MD   6 mg at 05/10/21 2127    methocarbamol (ROBAXIN) tablet  500 mg Oral 4x/day Larna Daughters, MD   500 mg at 05/10/21 2127    multivitamin-minerals-folic acid-lycopene-lutein (CERTAVITE SENIOR) tablet  1 Tablet Oral Q24H Cosner, Adam, PA-C   1 Tablet at 05/10/21 1400    NS 250 mL flush bag   Intravenous Q15 Min PRN Modena Jansky, DO        NS flush syringe  10-30 mL Intracatheter Q8HRS Nicola Police, MD   10 mL at 05/11/21 0600    NS flush syringe  20-30 mL Intracatheter Q1 MIN PRN Nicola Police, MD        NS flush syringe  2-6 mL  Intracatheter Q8HRS Modena Jansky, DO   6 mL at 05/11/21 0600    NS flush syringe  2-6 mL Intracatheter Q1 MIN PRN Modena Jansky, DO        ondansetron [facility]) 2 mg/mL injection  4 mg Intravenous Q6H PRN Ranae Palms, DO   4 mg at 05/10/21 1400    oxyCODONE (ROXICODONE) immediate release tablet  2.5 mg Oral Q4H PRN Stacey Drain, DO   2.5 mg at 05/10/21 1400    oxyCODONE (ROXICODONE) immediate release tablet  5 mg Oral Q4H PRN Stacey Drain, DO   5 mg at 05/09/21 0725    [Held by provider] pantoprazole (PROTONIX) delayed release tablet  40 mg Oral Q12H [name], MD   40 mg at 05/08/21 6812    prochlorperazine (COMPAZINE) 5 mg/mL injection  10 mg Intravenous Q6H PRN Stacey Drain, DO   10 mg at 05/09/21 0725    [Held by provider] sennosides-docusate sodium (SENOKOT-S) 8.6-'50mg'$  per tablet  1 Tablet Oral 2x/day Cosner, Adam, PA-C   1 Tablet at 04/26/21 2148    SSIP insulin  R human (HUMULIN R) 100 units/mL injection  0-12 Units Subcutaneous Q6H PRN Malena Peer, MD   2 Units at 05/11/21 0630     Objective:  Temperature: 36.7 C (98.1 F)  Heart Rate: 82  BP (Non-Invasive): 139/78  Respiratory Rate: 18  SpO2: 91 %  General: Chronically ill appearing male in no acute distress.  HEENT: Head normocephalic, atraumatic. Conjunctiva clear. No proptosis or lid lag. Oral mucosa pink and moist.   Thyroid/Neck: No thyromegaly. Trachea midline.  Lungs: Normal respiratory effort.   Cardiac: Regular rate and rhythm.  Abdomen: Soft, non-tender, non-distended. Ostomy in place   Extremities: No edema, erythema, warmth, or tenderness.  Skin: Warm and dry. No visible rashes.  Neuro: sleepy ,no obvious focal neurologic deficit   Psychiatric: Normal mood and affect.      Lab Results   Component Value Date    WBC 17.0 (H) 05/11/2021    RBC 2.75 (L) 05/11/2021    HGB 7.6 (L) 05/11/2021    HCT 24.8 (L) 05/11/2021    MCV 90.2 05/11/2021    MCH 27.6 05/11/2021    MCHC 30.6 (L) 05/11/2021     Lab Results   Component Value Date    SODIUM 136 05/11/2021    POTASSIUM 3.6 05/11/2021    CO2 25 05/11/2021    BUN 25 05/11/2021    CREATININE 0.70 (L) 05/11/2021    GFR >90 05/11/2021    CALCIUM 8.7 (L) 05/11/2021    ALBUMIN 2.0 (L) 05/10/2021    ALKPHOS 116 (H) 05/10/2021    ALT 27 05/10/2021    AST 32 05/10/2021    ANIONGAP 6 05/11/2021     Lab Results   Component Value Date    CHOLESTEROL 212 (H) 12/21/2020    HDLCHOL 44 12/21/2020    LDLCHOL 96 12/21/2020    LDLCHOLDIR 89 03/24/2020    TRIG 267 (H) 05/10/2021      Lab Results   Component Value Date    HA1C 9.3 (H) 03/17/2021         Recent Labs     05/09/21  1148 05/09/21  1753 05/09/21  2116 05/09/21  2344 05/10/21  0531 05/10/21  1131 05/10/21  1757 05/11/21  0041 05/11/21  0620   GLUIP 173* 231* 201* 198* 171* 188* 200* 192* 181*       Assessment/Recommendations:  [name] is a 76  y.o. 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 Endocrinology is consulted for management of diabetes.     Post Pancreatectomy Diabetes Mellitus  - Most recent HbA1c on 03/17/2021 was 9.3%.  - Home regimen: Lantus 11 units nightly, Novolog 4 units TID AC.   - Current inpatient regimen: Lantus 10 units nightly, Insulin NPH 5  units bid, Humulin R 8 units Q6 hours, and Humulin R moderate sliding scale 4x/day PRN.   -Diet: MNT PROTOCOL FOR DIETITIAN  DIET CLEAR LIQUID - LOW SUGAR  adult custom parenteral nutrition   - TPN currently running at 90 mL/hr.     Patient continues to have poor oral intake   Fasting sugar 181 , has been needing small amount of correctional coverage , no lows       - Recommendations:    -with sugars in 190-200's will increase the NPH slightly to 6 units bid    - continue with Lantus 10 units at night time -this should not be held regardless of the feeing status   -continue with insulin R 8 units every 6 hrs for TPN coverage ,patient not eating much on top of TPN , if diet is advanced will increase the dose of R   -continue with conservative  correctional scale of insulin R every 6 hrs as needed   Thank you for the consult. Endocrine will follow.          [name]  Endocrine fellow    05/11/2021  Department of Endocrine, Diabetes and metabolism       I saw and examined the patient. I reviewed the fellow's note. I agree with the findings and plan of care as documented in the fellow's note. Any exceptions/additions are edited/noted.    Keith Rake, MD, MPH 05/11/2021, 13:02

## 2021-05-11 NOTE — Progress Notes (Signed)
[facility]  Surgical Oncology  Progress Note      [name], [name], 76 y.o. male  Date of Birth:  1945/12/28  Date of Admission:  04/03/2021  Date of service: 05/11/2021    Assessment:  This is a 76 y.o. 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 He underwent massive transfusion protocol and ex lap on 04/03/21 at an outside facility with ligation of a bleeding vessel in the liver bed. Patient was subsequently transferred to [name] postoperaively, intubated. Patient was subsequently extubated later that day on 3/4. 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    Plan/Recommendations:  - Repeat blood cultures 4/9 due to leukocytosis (NGTD)  - Continue heparin gtt  - Follow up Endocrine recommendations  - Diet: CLD + TPN  - Peripancreatic fluid collections   - Advanced GI performed EUS with cystogastrostomy 4/6              - Blood cultures 3/9: + pseudomonas    - Cultures form R JP: Pseudomonas and enterococcus              - Repeat Blood cultures 3/12 negative   - ID consulted; Daptomycin and meropenum (completed 4/10)     - PE   - s/p IVC filter placement 3/17   - Aggressive pulm toilet, supp off O2.   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 No new issues this am.   Objective  Filed Vitals:    05/10/21 1628 05/10/21 1943 05/11/21 0043 05/11/21 0350   BP: 136/79 129/74 131/77 139/78   Pulse: 91 59 82 82   Resp: '16 16 16 18   '$ Temp: 37.1 C (98.8 F) 36.6 C (97.9 F) 36.8 C (98.2 F) 36.7 C (98.1 F)   SpO2: 94% 94% 90% 91%     Physical Exam:   GEN:  AOx4, resting in bed, no acute  distress  PULM: Normal respiratory effort. Equal/symmetric chest rise.  CV:  Pink, well perfused, RRR  ABD:   Abdomen soft, nontender, nondistended. Lower midline incision with feculent drainage present with ostomy pouch on top.  Pigtail catheter present with tan murky output.  MS: Atraumatic. Moves all extremities.  NEURO:   Alert and oriented to person, place and time.  Cranial nerves grossly intact.    Integumentary:  Pink, warm, and dry  PSYCHOSOCIAL: Pleasant.  Normal affect.     Labs  Results for orders placed or performed during the hospital encounter of 04/03/21 (from the past 24 hour(s))   PTT (PARTIAL THROMBOPLASTIN TIME) - ONCE   Result Value Ref Range    APTT 67.9 (H) 24.2 - 37.5 seconds    Narrative    Therapeutic range for unfractionated heparin is 60-100 seconds.   PTT (PARTIAL THROMBOPLASTIN TIME) - ONCE   Result Value Ref Range    APTT 63.3 (H) 24.2 - 37.5 seconds    Narrative    Therapeutic range for unfractionated heparin is 60-100 seconds.   URINALYSIS, MACROSCOPIC AND MICROSCOPIC W/CULTURE REFLEX    Specimen: Urine,  Clean Catch    Narrative    The following orders were created for panel order URINALYSIS, MACROSCOPIC AND MICROSCOPIC W/CULTURE REFLEX.  Procedure                               Abnormality         Status                     ---------                               -----------         ------                     URINALYSIS, MACROSCOPIC[[id number]]      Abnormal            Final result               URINALYSIS, MICROSCOPIC[[id number]]      Normal              Final result                 Please view results for these tests on the individual orders.   URINALYSIS, MACROSCOPIC   Result Value Ref Range    SPECIFIC GRAVITY 1.017 1.005 - 1.030    GLUCOSE 200 (A) Negative mg/dL    PROTEIN Negative Negative mg/dL    BILIRUBIN Negative Negative mg/dL    UROBILINOGEN Negative Negative mg/dL    PH 7.0 5.0 - 8.0    BLOOD Negative Negative mg/dL    KETONES Negative Negative mg/dL    NITRITE Negative  Negative    LEUKOCYTES Negative Negative WBCs/uL    APPEARANCE Clear Clear    COLOR Normal (Yellow) Normal (Yellow)   URINALYSIS, MICROSCOPIC   Result Value Ref Range    WBCS 0.0 <4.0 /hpf    RBCS 0.0 <6.0 /hpf    BACTERIA Occasional or less Occasional or less /hpf    MUCOUS Light Light /lpf   PTT (PARTIAL THROMBOPLASTIN TIME) - ONCE   Result Value Ref Range    APTT 70.3 (H) 24.2 - 37.5 seconds    Narrative    Therapeutic range for unfractionated heparin is 60-100 seconds.   BASIC METABOLIC PANEL   Result Value Ref Range    SODIUM 136 136 - 145 mmol/L    POTASSIUM 3.6 3.5 - 5.1 mmol/L    CHLORIDE 105 96 - 111 mmol/L    CO2 TOTAL 25 23 - 31 mmol/L    ANION GAP 6 4 - 13 mmol/L    CALCIUM 8.7 (L) 8.8 - 10.2 mg/dL    GLUCOSE 143 (H) 65 - 125 mg/dL    BUN 25 8 - 25 mg/dL    CREATININE 0.70 (L) 0.75 - 1.35 mg/dL    BUN/CREA RATIO 36 (H) 6 - 22    ESTIMATED GFR >90 >=60 mL/min/BSA   CBC   Result Value Ref Range    WBC 17.0 (H) 3.7 - 11.0 x10^3/uL    RBC 2.75 (L) 4.50 - 6.10 x10^6/uL    HGB 7.6 (L) 13.4 - 17.5 g/dL    HCT 24.8 (L) 38.9 - 52.0 %    MCV 90.2 78.0 - 100.0 fL    MCH 27.6 26.0 - 32.0 pg    MCHC 30.6 (L) 31.0 - 35.5  g/dL    RDW-CV 17.8 (H) 11.5 - 15.5 %    PLATELETS 537 (H) 150 - 400 x10^3/uL    MPV 12.1 8.7 - 12.5 fL   MAGNESIUM   Result Value Ref Range    MAGNESIUM 2.0 1.8 - 2.6 mg/dL   PHOSPHORUS   Result Value Ref Range    PHOSPHORUS 3.0 2.3 - 4.0 mg/dL   PTT (PARTIAL THROMBOPLASTIN TIME)   Result Value Ref Range    APTT 53.2 (H) 24.2 - 37.5 seconds    Narrative    Therapeutic range for unfractionated heparin is 60-100 seconds.   POC BLOOD GLUCOSE (RESULTS)   Result Value Ref Range    GLUCOSE, POC 188 (H) 70 - 105 mg/dl   POC BLOOD GLUCOSE (RESULTS)   Result Value Ref Range    GLUCOSE, POC 200 (H) 70 - 105 mg/dl   POC BLOOD GLUCOSE (RESULTS)   Result Value Ref Range    GLUCOSE, POC 192 (H) 70 - 105 mg/dl   POC BLOOD GLUCOSE (RESULTS)   Result Value Ref Range    GLUCOSE, POC 181 (H) 70 - 105 mg/dl        I/O:  Date 05/10/21 0700 - 05/11/21 0659 05/11/21 0700 - 05/12/21 0659   Shift 9833-8250 1500-2259 2300-0659 24 Hour Total 0700-1459 1500-2259 2300-0659 24 Hour Total   INTAKE   P.O.  90 240 330         Oral  90 240 330       I.V.(mL/kg/hr)  5397(6.73)  2160(1.25)         Volume Infused (adult custom parenteral nutrition)  2160  2160       IV Piggyback  100  100         Volume (sodium phosphate 30 mmol in D5W 100 mL IVPB)  100  100       Shift Total(mL/kg)  4193(79.02) 240(3.33) 4097(35.32)       OUTPUT   Urine(mL/kg/hr) 9924(2.68) 550(0.95) 1325(2.3) 3525(2.04)         Urine (Voided) 1650 550 1325 3525         Urine Occurrence 1 x   1 x       Drains  12.5 0 12.5         JP Drain Output ([place] Drain Right;Upper Abdomen)  0 0 0         Drain Output (Drain (Miscellaneous) #62F pigtail catheter for RLQ fluid collection Lower;Right;Anterior Abdomen)  12.5  12.5         Drain Output (Drain (Miscellaneous) peds ostomy bag Lower;Medial Abdomen)  0  0       Stool             Stool Occurrence 0 x   0 x       Shift Total(mL/kg) 3419(62.22) 562.5(7.8) 9798(92.11) 3537.5(49.06)       Weight (kg) 72.1 72.1 72.1 72.1 72.1 72.1 72.1 72.1       Radiology:  CT C/A/P   1.Postsurgical changes from distal pancreatectomy and splenectomy with persistent fluid collections along the surgical beds with two percutaneous drainage catheters noted. The fluid collection most closely approximating the pancreatic body shows marked interval decreased size status post stent placements, while the other collections similar to minimally decreased in size. No new organized intra-abdominal collections are identified.  2.Midline abdominal wall incision with underlying fluid and inflammatory changes are also similar to prior.   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 However, there is redemonstration of closely  approximating transverse colon to the anterior abdominal wall.  4.Small left-sided pleural effusion with associated passive atelectasis.  5.Small volume left upper quadrant intra-abdominal free fluid and diffuse mesenteric stranding throughout.     Current Medications:  Current Facility-Administered Medications   Medication Dose Route Frequency    acetaminophen (TYLENOL) tablet  500 mg Oral Q4H PRN    adult custom parenteral nutrition   Intravenous Continuous    atorvastatin (LIPITOR) tablet  40 mg Oral Daily with Breakfast    benzonatate (TESSALON) capsule  100 mg Oral Q8H PRN    D5W 250 mL flush bag   Intravenous Q15 Min PRN    [Held by provider] docusate sodium (COLACE) capsule  100 mg Oral 2x/day    elvitegravir-cobicistat-emtricitrabine-tenofovir (GENVOYA) 150-150-200-10 mg per tablet  1 Tablet Oral Daily    gabapentin (NEURONTIN) capsule  300 mg Oral 3x/day    heparin 25,000 units in NS 250 mL infusion  18 Units/kg/hr (Adjusted) Intravenous Continuous    insulin glargine-yfgn 100 units/mL injection  10 Units Subcutaneous NIGHTLY    insulin NPH human 100 units/mL injection  5 Units Subcutaneous 2x/day    insulin R human 100 units/mL injection  8 Units Subcutaneous Q6H    LR premix infusion   Intravenous Continuous    melatonin tablet  6 mg Oral NIGHTLY    methocarbamol (ROBAXIN) tablet  500 mg Oral 4x/day    multivitamin-minerals-folic acid-lycopene-lutein (CERTAVITE SENIOR) tablet  1 Tablet Oral Q24H    NS 250 mL flush bag   Intravenous Q15 Min PRN    NS flush syringe  10-30 mL Intracatheter Q8HRS    NS flush syringe  20-30 mL Intracatheter Q1 MIN PRN    NS flush syringe  2-6 mL Intracatheter Q8HRS    NS flush syringe  2-6 mL Intracatheter Q1 MIN PRN    ondansetron (ZOFRAN) 2 mg/mL injection  4 mg Intravenous Q6H PRN    oxyCODONE (ROXICODONE) immediate release tablet  2.5 mg Oral Q4H PRN    oxyCODONE (ROXICODONE) immediate release tablet  5 mg Oral Q4H PRN    [Held by provider] pantoprazole (PROTONIX) delayed  release tablet  40 mg Oral Q12H    prochlorperazine (COMPAZINE) 5 mg/mL injection  10 mg Intravenous Q6H PRN    [Held by provider] sennosides-docusate sodium (SENOKOT-S) 8.6-'50mg'$  per tablet  1 Tablet Oral 2x/day    SSIP insulin R human (HUMULIN R) 100 units/mL injection  0-12 Units Subcutaneous Q6H PRN       Allene Pyo, PA-C  05/11/2021, 07:26    I saw and examined the patient.  I reviewed the resident's note.  I agree with the findings and plan of care as documented in the resident's note.  Any exceptions/additions are edited/noted.    Aaron Edelman A. Cyndi Bender, MD, FACS

## 2021-05-12 LAB — BASIC METABOLIC PANEL
ANION GAP: 7 mmol/L (ref 4–13)
BUN/CREA RATIO: 29 — ABNORMAL HIGH (ref 6–22)
BUN: 22 mg/dL (ref 8–25)
CALCIUM: 9.2 mg/dL (ref 8.8–10.2)
CHLORIDE: 107 mmol/L (ref 96–111)
CO2 TOTAL: 25 mmol/L (ref 23–31)
CREATININE: 0.77 mg/dL (ref 0.75–1.35)
ESTIMATED GFR: >90 mL/min/BSA (ref >=60)
GLUCOSE: 155 mg/dL — ABNORMAL HIGH (ref 65–125)
POTASSIUM: 3.9 mmol/L (ref 3.5–5.1)
SODIUM: 139 mmol/L (ref 136–145)

## 2021-05-12 LAB — PTT (PARTIAL THROMBOPLASTIN TIME)
APTT: 69.5 seconds — ABNORMAL HIGH (ref 24.2–37.5)
APTT: 60.9 seconds — ABNORMAL HIGH (ref 24.2–37.5)
APTT: 36.5 seconds (ref 24.2–37.5)
APTT: 64.7 seconds — ABNORMAL HIGH (ref 24.2–37.5)

## 2021-05-12 LAB — POC BLOOD GLUCOSE (RESULTS)
GLUCOSE, POC: 173 mg/dl — ABNORMAL HIGH (ref 70–105)
GLUCOSE, POC: 177 mg/dl — ABNORMAL HIGH (ref 70–105)
GLUCOSE, POC: 192 mg/dl — ABNORMAL HIGH (ref 70–105)
GLUCOSE, POC: 201 mg/dl — ABNORMAL HIGH (ref 70–105)
GLUCOSE, POC: 212 mg/dl — ABNORMAL HIGH (ref 70–105)
GLUCOSE, POC: 214 mg/dl — ABNORMAL HIGH (ref 70–105)

## 2021-05-12 LAB — CBC
HCT: 23.5 % — ABNORMAL LOW (ref 38.9–52.0)
HGB: 7.2 g/dL — ABNORMAL LOW (ref 13.4–17.5)
MCH: 27.4 pg (ref 26.0–32.0)
MCHC: 30.6 g/dL — ABNORMAL LOW (ref 31.0–35.5)
MCV: 89.4 fL (ref 78.0–100.0)
MPV: 12 fL (ref 8.7–12.5)
PLATELETS: 430 10*3/uL — ABNORMAL HIGH (ref 150–400)
RBC: 2.63 10*6/uL — ABNORMAL LOW (ref 4.50–6.10)
RDW-CV: 17.6 % — ABNORMAL HIGH (ref 11.5–15.5)
WBC: 18 10*3/uL — ABNORMAL HIGH (ref 3.7–11.0)

## 2021-05-12 LAB — TRIGLYCERIDE: TRIGLYCERIDES: 140 mg/dL (ref <150)

## 2021-05-12 LAB — MAGNESIUM: MAGNESIUM: 2 mg/dL (ref 1.8–2.6)

## 2021-05-12 LAB — PHOSPHORUS: PHOSPHORUS: 3.1 mg/dL (ref 2.3–4.0)

## 2021-05-12 MED ORDER — INSULIN NPH ISOPHANE U-100 HUMAN 100 UNIT/ML SUBCUTANEOUS SUSPENSION
8.0000 [IU] | Freq: Two times a day (BID) | SUBCUTANEOUS | Status: DC
  Administered 2021-05-12 – 2021-05-13 (×2): 8 [IU] via SUBCUTANEOUS

## 2021-05-12 MED ORDER — WATER FOR INJECTION, STERILE INTRAVENOUS SOLUTION
INTRAVENOUS | Status: AC
Start: 2021-05-12 — End: 2021-05-13
  Filled 2021-05-12: qty 1000

## 2021-05-12 MED ORDER — INSULIN U-100 REGULAR HUMAN 100 UNIT/ML INJECTION SOLUTION
10.0000 [IU] | Freq: Four times a day (QID) | INTRAMUSCULAR | Status: DC
Start: 2021-05-12 — End: 2021-05-13
  Administered 2021-05-12 – 2021-05-13 (×4): 10 [IU] via SUBCUTANEOUS

## 2021-05-12 NOTE — Care Plan (Signed)
Medical Nutrition Therapy Follow Up        SUBJECTIVE : pt on low sugar CL diet plus TPN    OBJECTIVE:     Current Diet Order/Nutrition Support: low sugar CL diet plus TPN   TPN  Rate: 80 ml/hr  2100 kcal /day = 28 kcal/kg  100 grams Protein = 1.4 grams/ kg  900 Dextrose kcals, GIR 2.4  800 IL kcals or 1 grams Fat/kg (38% of total calories)       Height Used for Calculations: 175.3 cm (5' 9.02")  Weight Used For Calculations: 75.9 kg (167 lb 5.3 oz) (standing weight on 3/3)  Weight trends: 3/14- 84.4 kg (bed) ; 3/23- 84.4 kg (no edema noted on flow sheet); 3/24- 79.5 kg ;4/3- 71.8 kg (bed) ; 4/8- 72.1 kg   BMI (kg/m2): 24.75  BMI Assessment: BMI 18.5-24.9: normal  Ideal Body Weight (IBW) (kg): 73.73  % Ideal Body Weight: 102.94     Usual Body Weight: 81.3 kg (179 lb 3.7 oz)  Weight Loss:  (intentional weight loss)      Estimated Needs:    Energy Calorie Requirements: 2100-2300 cal (28-30 cal/75.9 kg)  Protein Requirements (gms/day): 88-110 g prot (1.2-1.5 g/73.7 kg)   Fluid Requirements: 2100-2300 ml (28-30 ml/75.9 k)       Comments: 76 y.o. 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 He underwent massive transfusion protocol and ex lap on 04/03/21 at an outside facility with ligation of a bleeding vessel in the liver bed. Patient was subsequently transferred to [facility] postoperaively, intubated. Patient was subsequently extubated later that day on 3/4. 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     Endocrinology following for diabetes management       Latest Reference Range & Units 05/10/21 05:29 05/11/21 05:17 05/12/21 04:20   TRIGLYCERIDES <150 mg/dL 267 (H) 199 (H) 140   (H): Data is abnormally high     Latest Reference Range & Units 03/17/21 09:17   HEMOGLOBIN A1C <5.7 % 9.3 (H)   (H): Data is abnormally high     Latest Reference  Range & Units 05/11/21 18:21 05/12/21 00:02 05/12/21 06:39   GLUCOSE, POC 70 - 105 mg/dl 202 (H) 201 (H) 214 (H)   (H): Data is abnormally high    Plan/Interventions :   Continue sugar free CL diet    Decreased protein in TPN yesterday due to increased urine output (high protein intake has a diuretic effect). Will continue to monitor UOP    Continue TPN but will increase lipids and decrease dextrose: 2100 cal, (28 cal/kg), 100 g prot (1.4 g/kg), 860 dextrose cal (GIR 2.4) 840 lipids (1.1 g/kg and 40% of total calories) at ~80 ml/hr    Monitor potassium, magnesium and phosphorus daily along with blood sugars and BMP and electrolytes can be adjusted in TPN as needed    Monitor LFTs and triglycerides weekly.   Monitor weekly weights. (ordered a standing weight be obtained if possible)    Will continue to follow.     Nutrition Diagnosis: Altered GI function related to midline fistula draining stool as evidenced by need for TPN: in progress     Dierdre Searles, [place], LD, [place] 05/12/2021, 07:35  Pager [phone]

## 2021-05-12 NOTE — Consults (Signed)
[facility]  Endocrinology Consult  Follow Up Note    [name], [name], 76 y.o. male  Date of Service: 05/12/2021  Date of Birth:  [birth year]    Hospital Day:  LOS: 39 days     Reason for Consult: : Diabetes management     [name] is a 76 y.o. 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      Subjective:   Patient seen at bedside, getting bedside bath , has no complains . He continues to be on clear liquid diet with TPN support at 80 ml/hr     Current Facility-Administered Medications   Medication Dose Route Frequency Provider Last Rate Last Admin    acetaminophen (TYLENOL) tablet  500 mg Oral Q4H PRN Donna Christen, MD   500 mg at 05/11/21 0841    adult custom parenteral nutrition   Intravenous Continuous Cosner, Adam, PA-C 80 mL/hr at 05/12/21 0730 Solution Verified at 05/12/21 0730    atorvastatin (LIPITOR) tablet  40 mg Oral Daily with Breakfast Nicola Police, MD   40 mg at 05/12/21 0813    benzonatate (TESSALON) capsule  100 mg Oral Q8H PRN [name], MD        D5W 250 mL flush bag   Intravenous Q15 Min PRN Modena Jansky, DO        [Held by provider] docusate sodium (COLACE) capsule  100 mg Oral 2x/day Cosner, Adam, PA-C   100 mg at 04/26/21 5188    elvitegravir-cobicistat-emtricitrabine-tenofovir (GENVOYA) 150-150-200-10 mg per tablet  1 Tablet Oral Daily Monia Pouch, DO   1 Tablet at 05/12/21 4166    gabapentin (NEURONTIN) capsule  300 mg Oral 3x/day Mariea Clonts, MD   300 mg at 05/12/21 0813    heparin 25,000 units in NS 250 mL infusion  18 Units/kg/hr (Adjusted) Intravenous Continuous Lucile Crater, MD 16.3 mL/hr at 05/12/21 0732 22 Units/kg/hr at 05/12/21 0732    insulin glargine-yfgn 100  units/mL injection  10 Units Subcutaneous NIGHTLY Shafiq, Ramsha, MD   10 Units at 05/11/21 2141    insulin NPH human 100 units/mL injection  8 Units Subcutaneous 2x/day Sadiq, Mehjabeen, MD        insulin R human 100 units/mL injection  10 Units Subcutaneous Q6H Malena Peer, MD        LR premix infusion   Intravenous Continuous Mariea Clonts, MD 45 mL/hr at 05/11/21 1849 New Bag at 05/11/21 1849    melatonin tablet  6 mg Oral NIGHTLY Croley, Mallory, MD   6 mg at 05/11/21 2142    methocarbamol (ROBAXIN) tablet  500 mg Oral 4x/day Larna Daughters, MD   500 mg at 05/12/21 0813    multivitamin-minerals-folic acid-lycopene-lutein (CERTAVITE SENIOR) tablet  1 Tablet Oral Q24H Cosner, Adam, PA-C   1 Tablet at 05/11/21 1434    NS 250 mL flush bag   Intravenous Q15 Min PRN Modena Jansky, DO        NS flush syringe  10-30 mL Intracatheter Q8HRS Nicola Police, MD   10 mL at 05/11/21 2200    NS flush syringe  20-30 mL Intracatheter Q1 MIN PRN Nicola Police, MD        NS flush syringe  2-6 mL  Intracatheter Q8HRS Modena Jansky, DO   6 mL at 05/11/21 2200    NS flush syringe  2-6 mL Intracatheter Q1 MIN PRN Modena Jansky, DO        [facility]) 2 mg/mL injection  4 mg Intravenous Q6H PRN Ranae Palms, DO   4 mg at 05/12/21 1610    oxyCODONE (ROXICODONE) immediate release tablet  2.5 mg Oral Q4H PRN Stacey Drain, DO   2.5 mg at 05/10/21 1400    oxyCODONE (ROXICODONE) immediate release tablet  5 mg Oral Q4H PRN Stacey Drain, DO   5 mg at 05/09/21 0725    [Held by provider] pantoprazole (PROTONIX) delayed release tablet  40 mg Oral Q12H [name], MD   40 mg at 05/08/21 9604    prochlorperazine (COMPAZINE) 5 mg/mL injection  10 mg Intravenous Q6H PRN Stacey Drain, DO   10 mg at 05/11/21 1848    [Held by provider] sennosides-docusate sodium (SENOKOT-S) 8.6-'50mg'$  per tablet  1 Tablet Oral 2x/day Cosner, Adam, PA-C   1 Tablet at 04/26/21 2148    SSIP insulin R human (HUMULIN R)  100 units/mL injection  0-12 Units Subcutaneous Q6H PRN Malena Peer, MD   4 Units at 05/12/21 0644     Objective:  Temperature: 37 C (98.6 F)  Heart Rate: 94  BP (Non-Invasive): (!) 120/59  Respiratory Rate: 16  SpO2: 91 %  General: Chronically ill appearing male in no acute distress.  HEENT: Head normocephalic, atraumatic. Conjunctiva clear. No proptosis or lid lag. Oral mucosa pink and moist.   Thyroid/Neck: No thyromegaly. Trachea midline.  Lungs: Normal respiratory effort.   Cardiac: Regular rate and rhythm.  Abdomen: Soft, non-tender, non-distended. Ostomy in place   Extremities: No edema, erythema, warmth, or tenderness.  Skin: Warm and dry. No visible rashes.  Neuro: sleepy ,no obvious focal neurologic deficit   Psychiatric: Normal mood and affect.      Lab Results   Component Value Date    WBC 18.0 (H) 05/12/2021    RBC 2.63 (L) 05/12/2021    HGB 7.2 (L) 05/12/2021    HCT 23.5 (L) 05/12/2021    MCV 89.4 05/12/2021    MCH 27.4 05/12/2021    MCHC 30.6 (L) 05/12/2021     Lab Results   Component Value Date    SODIUM 139 05/12/2021    POTASSIUM 3.9 05/12/2021    CO2 25 05/12/2021    BUN 22 05/12/2021    CREATININE 0.77 05/12/2021    GFR >90 05/12/2021    CALCIUM 9.2 05/12/2021    ALBUMIN 2.0 (L) 05/10/2021    ALKPHOS 116 (H) 05/10/2021    ALT 27 05/10/2021    AST 32 05/10/2021    ANIONGAP 7 05/12/2021     Lab Results   Component Value Date    CHOLESTEROL 212 (H) 12/21/2020    HDLCHOL 44 12/21/2020    LDLCHOL 96 12/21/2020    LDLCHOLDIR 89 03/24/2020    TRIG 140 05/12/2021      Lab Results   Component Value Date    HA1C 9.3 (H) 03/17/2021         Recent Labs     05/10/21  1131 05/10/21  1757 05/11/21  0041 05/11/21  0620 05/11/21  1142 05/11/21  1821 05/12/21  0002 05/12/21  0639   GLUIP 188* 200* 192* 181* 181* 202* 201* 214*       Assessment/Recommendations:  [name] is a 76 y.o. 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 Endocrinology is consulted for management of diabetes.     Post Pancreatectomy Diabetes Mellitus  - Most recent HbA1c on 03/17/2021 was 9.3%.  - Home regimen: Lantus 11 units nightly, Novolog 4 units TID AC.   - Current inpatient regimen: Lantus 10 units nightly, Insulin NPH 6  units bid, Humulin R 8 units Q6 hours, and Humulin R moderate sliding scale 4x/day PRN.   -Diet: MNT PROTOCOL FOR DIETITIAN  DIET CLEAR LIQUID - LOW SUGAR  adult custom parenteral nutrition   - TPN currently running at 80 mL/hr.     Patient continues to be on TPN support with clear liquid diet , sugars noted to be in 200's needing total of 42 units in coverage .     - Recommendations:    -with sugars in in 200's ,will increase the NPH to 8 units bid   -will increase the insulin R to 10 units every 6 hrs for TPN coverage     - continue with Lantus 10 units at night time -this should not be held regardless of the feeing status   -continue with conservative  correctional scale of insulin R every 6 hrs as needed   Thank you for the consult. Endocrine will follow.          Mehjabeen GQBVQ  Endocrine fellow    05/12/2021  Department of Endocrine, Diabetes and metabolism       I saw and examined the patient. I reviewed the fellow's note. I agree with the findings and plan of care as documented in the fellow's note. Any exceptions/additions are edited/noted.    Keith Rake, MD, MPH 05/12/2021, 14:00

## 2021-05-12 NOTE — Nurses Notes (Addendum)
1930: PTT redrawn because of vast difference between 1100 and 1800 levels. Last value 36.5 from 60.9.    2045: PTT lab clotted per hematology. PTT ordered again. Nursing awaiting level to appropriately titrate heparin drip.    2100: PTT therapeutic at 64.7. Per heparin thrombotic protocol, no change to drip rate necessary. Dose rate continued at 22 units/kg/hr. Next PTT scheduled for 0300.

## 2021-05-12 NOTE — Nurses Notes (Signed)
APTT results came back 60.9 per protocol no changes are to be made to rate and aptt blood recheck will be done in 6 hours, 1800.

## 2021-05-12 NOTE — Progress Notes (Signed)
[facility]  Surgical Oncology  Progress Note      [name], [name], 76 y.o. male  Date of Birth:  [birth year]  Date of Admission:  04/03/2021  Date of service: 05/12/2021    Assessment:  This is a 76 y.o. 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 He underwent massive transfusion protocol and ex lap on 04/03/21 at an outside facility with ligation of a bleeding vessel in the liver bed. Patient was subsequently transferred to [facility] postoperaively, intubated. Patient was subsequently extubated later that day on 3/4. 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  Objective  Filed Vitals:    05/11/21 1509 05/11/21 1945 05/11/21 2354 05/12/21 0336   BP: (!) 143/78 (!) 154/81 (!) 147/72 (!) 120/59   Pulse: 92 98 99 94   Resp: '18 18 16 16   '$ Temp: 36.2 C (97.2 F) 36.9 C (98.4 F) 37.3 C (99.1 F) 37 C (98.6 F)   SpO2: 94% 91% 90% 91%     Physical Exam:   GEN:  AOx4, resting in bed, no acute distress  PULM: Normal respiratory effort. Equal/symmetric chest rise.  CV:  Pink, well  perfused, RRR  ABD:   Abdomen soft, nontender, nondistended. Lower midline incision with feculent drainage present with ostomy pouch on top.    MS: Atraumatic. Moves all extremities.  NEURO:   Alert and oriented to person, place and time.  Cranial nerves grossly intact.    Integumentary:  Pink, warm, and dry  PSYCHOSOCIAL: Pleasant.  Normal affect.     Labs  Results for orders placed or performed during the hospital encounter of 04/03/21 (from the past 24 hour(s))   PTT (PARTIAL THROMBOPLASTIN TIME)   Result Value Ref Range    APTT 58.3 (H) 24.2 - 37.5 seconds    Narrative    Therapeutic range for unfractionated heparin is 60-100 seconds.   PTT (PARTIAL THROMBOPLASTIN TIME)   Result Value Ref Range    APTT 54.2 (H) 24.2 - 37.5 seconds    Narrative    Therapeutic range for unfractionated heparin is 60-100 seconds.   BASIC METABOLIC PANEL   Result Value Ref Range    SODIUM 139 136 - 145 mmol/L    POTASSIUM 3.9 3.5 - 5.1 mmol/L    CHLORIDE 107 96 - 111 mmol/L    CO2 TOTAL  25 23 - 31 mmol/L    ANION GAP 7 4 - 13 mmol/L    CALCIUM 9.2 8.8 - 10.2 mg/dL    GLUCOSE 155 (H) 65 - 125 mg/dL    BUN 22 8 - 25 mg/dL    CREATININE 0.77 0.75 - 1.35 mg/dL    BUN/CREA RATIO 29 (H) 6 - 22    ESTIMATED GFR >90 >=60 mL/min/BSA   CBC   Result Value Ref Range    WBC 18.0 (H) 3.7 - 11.0 x10^3/uL    RBC 2.63 (L) 4.50 - 6.10 x10^6/uL    HGB 7.2 (L) 13.4 - 17.5 g/dL    HCT 23.5 (L) 38.9 - 52.0 %    MCV 89.4 78.0 - 100.0 fL    MCH 27.4 26.0 - 32.0 pg    MCHC 30.6 (L) 31.0 - 35.5 g/dL    RDW-CV 17.6 (H) 11.5 - 15.5 %    PLATELETS 430 (H) 150 - 400 x10^3/uL    MPV 12.0 8.7 - 12.5 fL   MAGNESIUM   Result Value Ref Range    MAGNESIUM 2.0 1.8 - 2.6 mg/dL   PHOSPHORUS   Result Value Ref Range    PHOSPHORUS 3.1 2.3 - 4.0 mg/dL   PTT (PARTIAL THROMBOPLASTIN TIME)   Result Value Ref Range    APTT 69.5 (H) 24.2 - 37.5 seconds    Narrative    Therapeutic range for unfractionated heparin is 60-100 seconds.   POC BLOOD GLUCOSE (RESULTS)   Result Value Ref  Range    GLUCOSE, POC 181 (H) 70 - 105 mg/dl   POC BLOOD GLUCOSE (RESULTS)   Result Value Ref Range    GLUCOSE, POC 202 (H) 70 - 105 mg/dl   POC BLOOD GLUCOSE (RESULTS)   Result Value Ref Range    GLUCOSE, POC 201 (H) 70 - 105 mg/dl   POC BLOOD GLUCOSE (RESULTS)   Result Value Ref Range    GLUCOSE, POC 214 (H) 70 - 105 mg/dl       I/O:  Date 05/11/21 0700 - 05/12/21 0659 05/12/21 0700 - 05/13/21 0659   Shift 0700-1459 1500-2259 2300-0659 24 Hour Total 0700-1459 1500-2259 2300-0659 24 Hour Total   INTAKE   I.V.(mL/kg/hr)  8937(3.42)  2160(1.25)         Volume Infused (adult custom parenteral nutrition)  2160  2160       Shift Total(mL/kg)  8768(11.57)  2620(35.59)       OUTPUT   Urine(mL/kg/hr) 741(6.38) 1375(2.38)  2135(1.23)         Urine (Voided) 760 1375  2135       Emesis             Emesis Occurrence  0 x  0 x       Other             Other  0 x  0 x       Stool             Stool Occurrence  1 x  1 x       Shift Total(mL/kg) 453(64.68) 0321(22.48)  2500(37.04)       Weight (kg) 72.1 72.1 72.1 72.1 72.1 72.1 72.1 72.1       Radiology:  05/10/21 CT C/A/P   1.Postsurgical changes from distal pancreatectomy and splenectomy with persistent fluid collections along the surgical beds with two percutaneous drainage catheters noted. The fluid collection most closely approximating the pancreatic body shows marked interval decreased size  status post stent placements, while the other collections similar to minimally decreased in size. No new organized intra-abdominal collections are identified.  2.Midline abdominal wall incision with underlying fluid and inflammatory changes are also similar to prior.   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 However, there is redemonstration of closely approximating transverse colon to the anterior abdominal wall.  4.Small left-sided pleural effusion with  associated passive atelectasis.  5.Small volume left upper quadrant intra-abdominal free fluid and diffuse mesenteric stranding throughout.     Current Medications:  Current Facility-Administered Medications   Medication Dose Route Frequency    acetaminophen (TYLENOL) tablet  500 mg Oral Q4H PRN    adult custom parenteral nutrition   Intravenous Continuous    atorvastatin (LIPITOR) tablet  40 mg Oral Daily with Breakfast    benzonatate (TESSALON) capsule  100 mg Oral Q8H PRN    D5W 250 mL flush bag   Intravenous Q15 Min PRN    [Held by provider] docusate sodium (COLACE) capsule  100 mg Oral 2x/day    elvitegravir-cobicistat-emtricitrabine-tenofovir (GENVOYA) 150-150-200-10 mg per tablet  1 Tablet Oral Daily    gabapentin (NEURONTIN) capsule  300 mg Oral 3x/day    heparin 25,000 units in NS 250 mL infusion  18 Units/kg/hr (Adjusted) Intravenous Continuous    insulin glargine-yfgn 100 units/mL injection  10 Units Subcutaneous NIGHTLY    insulin NPH human 100 units/mL injection  6 Units Subcutaneous 2x/day    insulin R human 100 units/mL injection  8 Units Subcutaneous Q6H    LR premix infusion   Intravenous Continuous    melatonin tablet  6 mg Oral NIGHTLY    methocarbamol (ROBAXIN) tablet  500 mg Oral 4x/day    multivitamin-minerals-folic acid-lycopene-lutein (CERTAVITE SENIOR) tablet  1 Tablet Oral Q24H    NS 250 mL flush bag   Intravenous Q15 Min PRN    NS flush syringe  10-30 mL Intracatheter Q8HRS    NS flush syringe  20-30 mL Intracatheter Q1 MIN PRN    NS flush syringe  2-6 mL Intracatheter Q8HRS    NS flush syringe  2-6 mL Intracatheter Q1 MIN PRN    ondansetron (ZOFRAN) 2 mg/mL injection  4 mg Intravenous Q6H PRN    oxyCODONE (ROXICODONE) immediate release tablet  2.5 mg Oral Q4H PRN    oxyCODONE (ROXICODONE) immediate release tablet  5 mg Oral Q4H PRN    [Held by provider] pantoprazole (PROTONIX) delayed release tablet  40 mg Oral Q12H    prochlorperazine (COMPAZINE) 5 mg/mL injection  10 mg Intravenous Q6H  PRN    [Held by provider] sennosides-docusate sodium (SENOKOT-S) 8.6-'50mg'$  per tablet  1 Tablet Oral 2x/day    SSIP insulin R human (HUMULIN R) 100 units/mL injection  0-12 Units Subcutaneous Q6H PRN       Allene Pyo, PA-C  05/12/2021, 07:33    I personally saw and examined the patient. See physician's assistant note for additional details.     Advance  to fulls.      Lynwood Dawley, MD

## 2021-05-13 ENCOUNTER — Inpatient Hospital Stay (HOSPITAL_COMMUNITY): Payer: 59

## 2021-05-13 ENCOUNTER — Other Ambulatory Visit (INDEPENDENT_AMBULATORY_CARE_PROVIDER_SITE_OTHER): Payer: Self-pay

## 2021-05-13 DIAGNOSIS — K5900 Constipation, unspecified: Secondary | ICD-10-CM

## 2021-05-13 DIAGNOSIS — J9811 Atelectasis: Secondary | ICD-10-CM

## 2021-05-13 DIAGNOSIS — J90 Pleural effusion, not elsewhere classified: Secondary | ICD-10-CM

## 2021-05-13 DIAGNOSIS — R140 Abdominal distension (gaseous): Secondary | ICD-10-CM

## 2021-05-13 DIAGNOSIS — Z452 Encounter for adjustment and management of vascular access device: Secondary | ICD-10-CM

## 2021-05-13 DIAGNOSIS — E139 Other specified diabetes mellitus without complications: Secondary | ICD-10-CM

## 2021-05-13 DIAGNOSIS — K8591 Acute pancreatitis with uninfected necrosis, unspecified: Secondary | ICD-10-CM

## 2021-05-13 LAB — BASIC METABOLIC PANEL
ANION GAP: 6 mmol/L (ref 4–13)
BUN/CREA RATIO: 30 — ABNORMAL HIGH (ref 6–22)
BUN: 21 mg/dL (ref 8–25)
CALCIUM: 9 mg/dL (ref 8.8–10.2)
CHLORIDE: 106 mmol/L (ref 96–111)
CO2 TOTAL: 27 mmol/L (ref 23–31)
CREATININE: 0.69 mg/dL — ABNORMAL LOW (ref 0.75–1.35)
ESTIMATED GFR: >90 mL/min/BSA (ref >=60)
GLUCOSE: 163 mg/dL — ABNORMAL HIGH (ref 65–125)
POTASSIUM: 3.6 mmol/L (ref 3.5–5.1)
SODIUM: 139 mmol/L (ref 136–145)

## 2021-05-13 LAB — CBC
HCT: 23.7 % — ABNORMAL LOW (ref 38.9–52.0)
HGB: 7.3 g/dL — ABNORMAL LOW (ref 13.4–17.5)
MCH: 27.7 pg (ref 26.0–32.0)
MCHC: 30.8 g/dL — ABNORMAL LOW (ref 31.0–35.5)
MCV: 89.8 fL (ref 78.0–100.0)
MPV: 12.4 fL (ref 8.7–12.5)
PLATELETS: 490 10*3/uL — ABNORMAL HIGH (ref 150–400)
RBC: 2.64 10*6/uL — ABNORMAL LOW (ref 4.50–6.10)
RDW-CV: 17.6 % — ABNORMAL HIGH (ref 11.5–15.5)
WBC: 16.4 10*3/uL — ABNORMAL HIGH (ref 3.7–11.0)

## 2021-05-13 LAB — POC BLOOD GLUCOSE (RESULTS)
GLUCOSE, POC: 200 mg/dl — ABNORMAL HIGH (ref 70–105)
GLUCOSE, POC: 196 mg/dl — ABNORMAL HIGH (ref 70–105)
GLUCOSE, POC: 175 mg/dl — ABNORMAL HIGH (ref 70–105)
GLUCOSE, POC: 178 mg/dl — ABNORMAL HIGH (ref 70–105)

## 2021-05-13 LAB — PTT (PARTIAL THROMBOPLASTIN TIME): APTT: 61.4 seconds — ABNORMAL HIGH (ref 24.2–37.5)

## 2021-05-13 LAB — PHOSPHORUS: PHOSPHORUS: 3.4 mg/dL (ref 2.3–4.0)

## 2021-05-13 LAB — MAGNESIUM: MAGNESIUM: 2 mg/dL (ref 1.8–2.6)

## 2021-05-13 MED ORDER — INSULIN U-100 REGULAR HUMAN 100 UNIT/ML INJECTION SOLUTION
12.0000 [IU] | Freq: Four times a day (QID) | INTRAMUSCULAR | Status: DC
Start: 2021-05-13 — End: 2021-05-14
  Administered 2021-05-13 (×2): 0 [IU] via SUBCUTANEOUS
  Administered 2021-05-14 (×2): 12 [IU] via SUBCUTANEOUS

## 2021-05-13 MED ORDER — ALTEPLASE 2 MG/2 ML INJECTION FOR SALVAGE
6.0000 mg | INJECTION | INTRAMUSCULAR | Status: AC
Start: 2021-05-13 — End: 2021-05-13
  Administered 2021-05-13: 6 mg
  Filled 2021-05-13: qty 6

## 2021-05-13 MED ORDER — LACTATED RINGERS INTRAVENOUS SOLUTION
INTRAVENOUS | Status: DC
Start: 2021-05-13 — End: 2021-05-16
  Administered 2021-05-16: 0 mL via INTRAVENOUS

## 2021-05-13 MED ORDER — INSULIN NPH ISOPHANE U-100 HUMAN 100 UNIT/ML SUBCUTANEOUS SUSPENSION
10.0000 [IU] | Freq: Two times a day (BID) | SUBCUTANEOUS | Status: DC
  Administered 2021-05-13 – 2021-05-14 (×3): 10 [IU] via SUBCUTANEOUS
  Administered 2021-05-15: 0 [IU] via SUBCUTANEOUS

## 2021-05-13 MED ORDER — WATER FOR INJECTION, STERILE INTRAVENOUS SOLUTION
INTRAVENOUS | Status: AC
Start: 2021-05-13 — End: 2021-05-14
  Filled 2021-05-13: qty 1000

## 2021-05-13 MED ORDER — INSULIN U-100 REGULAR HUMAN 100 UNIT/ML INJECTION SOLUTION
INTRAMUSCULAR | Status: AC
Start: 2021-05-13 — End: 2021-05-13
  Administered 2021-05-13: 10 [IU] via SUBCUTANEOUS
  Filled 2021-05-13: qty 30

## 2021-05-13 NOTE — Nurses Notes (Signed)
Vascular Access Consult:  Notified by Surg. Oncology service that patient PICC line does not draw blood. Informed service that bedside RN should change PICC  dressing,change port caps ,ensure external length has not changed, and notice if any kinks noted. Then, a CXR can be obtained and if tip position is good,TPA can be ordered.Linard Millers BSN, Therapist, sports, [facility]                Company secretary   Pager [phone]

## 2021-05-13 NOTE — Nurses Notes (Signed)
Vascular Access Consult:  CXR obtained and PICC tip noted in SVC. Alteplase for line occlusion ordered .Linard Millers BSN, Therapist, sports, [facility]            Company secretary   Pager [phone]

## 2021-05-13 NOTE — Progress Notes (Signed)
[facility]  Surgical Oncology  Progress Note      [name], 76 y.o. male  Date of Birth:  1945/09/14  Date of Admission:  04/03/2021  Date of service: 05/13/2021    Assessment:  This is a 76 y.o. 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 He underwent massive transfusion protocol and ex lap on 04/03/21 at an outside facility with ligation of a bleeding vessel in the liver bed. Patient was subsequently transferred to [facility] postoperaively, intubated. Patient was subsequently extubated later that day on 3/4. 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 Reports PICC line not able to be used for labs.   Objective  Filed Vitals:    05/12/21 2357 05/13/21 0216 05/13/21 0324 05/13/21 0741   BP: (!) 159/79  (!) 147/72 (!) 143/86   Pulse: (!) 102  90 92   Resp: '18 16 16 16   '$ Temp: 37.2 C (99 F)  36.8 C (98.2 F) 36.8 C (98.2 F)   SpO2: 92%  90% 91%     Physical Exam:   GEN:  AOx4,  resting in bed, no acute distress  PULM: Normal respiratory effort. Equal/symmetric chest rise.  CV:  Pink, well perfused, RRR  ABD:   Abdomen soft, nontender, nondistended. Lower midline incision with thin tan/grey drainage present with ostomy pouch on top.    MS: Atraumatic. Moves all extremities.  NEURO:   Alert and oriented to person, place and time.  Cranial nerves grossly intact.    Integumentary:  Pink, warm, and dry  PSYCHOSOCIAL: Pleasant.  Normal affect.     Labs  Results for orders placed or performed during the hospital encounter of 04/03/21 (from the past 24 hour(s))   PTT (PARTIAL THROMBOPLASTIN TIME)   Result Value Ref Range    APTT 60.9 (H) 24.2 - 37.5 seconds    Narrative    Therapeutic range for unfractionated heparin is 60-100 seconds.   PTT (PARTIAL THROMBOPLASTIN TIME)   Result Value Ref Range    APTT 36.5 24.2 - 37.5 seconds    Narrative    Therapeutic range for unfractionated heparin is 60-100 seconds.   PTT (PARTIAL THROMBOPLASTIN TIME) - ONCE   Result Value Ref Range    APTT 64.7 (H)  24.2 - 37.5 seconds    Narrative    Therapeutic range for unfractionated heparin is 60-100 seconds.   BASIC METABOLIC PANEL   Result Value Ref Range    SODIUM 139 136 - 145 mmol/L    POTASSIUM 3.6 3.5 - 5.1 mmol/L    CHLORIDE 106 96 - 111 mmol/L    CO2 TOTAL 27 23 - 31 mmol/L    ANION GAP 6 4 - 13 mmol/L    CALCIUM 9.0 8.8 - 10.2 mg/dL    GLUCOSE 163 (H) 65 - 125 mg/dL    BUN 21 8 - 25 mg/dL    CREATININE 0.69 (L) 0.75 - 1.35 mg/dL    BUN/CREA RATIO 30 (H) 6 - 22    ESTIMATED GFR >90 >=60 mL/min/BSA    Narrative    Lipemia can alter results at this level (slight).   CBC   Result Value Ref Range    WBC 16.4 (H) 3.7 - 11.0 x10^3/uL    RBC 2.64 (L) 4.50 - 6.10 x10^6/uL    HGB 7.3 (L) 13.4 - 17.5 g/dL    HCT 23.7 (L) 38.9 - 52.0 %    MCV 89.8 78.0 - 100.0 fL    MCH 27.7 26.0 - 32.0 pg    MCHC 30.8 (L) 31.0 - 35.5 g/dL    RDW-CV 17.6 (H) 11.5 - 15.5 %    PLATELETS 490 (H) 150 - 400 x10^3/uL    MPV 12.4 8.7 - 12.5 fL    MAGNESIUM   Result Value Ref Range    MAGNESIUM 2.0 1.8 - 2.6 mg/dL    Narrative    Lipemia can alter results at this level (slight).   PHOSPHORUS   Result Value Ref Range    PHOSPHORUS 3.4 2.3 - 4.0 mg/dL   PTT (PARTIAL THROMBOPLASTIN TIME) - ONCE   Result Value Ref Range    APTT 61.4 (H) 24.2 - 37.5 seconds    Narrative    Therapeutic range for unfractionated heparin is 60-100 seconds.   POC BLOOD GLUCOSE (RESULTS)   Result Value Ref Range    GLUCOSE, POC 212 (H) 70 - 105 mg/dl   POC BLOOD GLUCOSE (RESULTS)   Result Value Ref Range    GLUCOSE, POC 192 (H) 70 - 105 mg/dl   POC BLOOD GLUCOSE (RESULTS)   Result Value Ref Range    GLUCOSE, POC 177 (H) 70 - 105 mg/dl   POC BLOOD GLUCOSE (RESULTS)   Result Value Ref Range    GLUCOSE, POC 173 (H) 70 - 105 mg/dl   POC BLOOD GLUCOSE (RESULTS)   Result Value Ref Range    GLUCOSE, POC 200 (H) 70 - 105 mg/dl       I/O:  Date 05/12/21 0700 - 05/13/21 0659 05/13/21 0700 - 05/14/21 0659   Shift 0700-1459 1500-2259 2300-0659 24 Hour Total 0700-1459 1500-2259 2300-0659 24 Hour Total   INTAKE   P.O.     0   0     Oral     0   0   I.V.(mL/kg/hr)  8527(7.82)  1920(1.11)         Volume Infused (adult custom parenteral nutrition)  1920  1920       Shift Total(mL/kg)  4235(36.14)  4315(40.08) 0(0)   0(0)   OUTPUT   Urine(mL/kg/hr) 676(1.95) 0932(6.71) 260(0.45) 2458(0.99)         Urine (Voided) 275 1060 260 1595       Drains  80  80  Drain Output (Drain (Miscellaneous) peds ostomy bag Lower;Medial Abdomen)  80  80       Stool             Stool Occurrence  0 x  0 x       Shift Total(mL/kg) 275(3.81) 9470(96.28) 260(3.61) 3662(94.76)       Weight (kg) 72.1 72.1 72.1 72.1 72.1 72.1 72.1 72.1       Radiology:  05/10/21 CT C/A/P   1.Postsurgical changes from distal pancreatectomy and splenectomy with persistent fluid collections along the surgical beds with two percutaneous drainage catheters noted. The fluid collection most closely approximating the pancreatic body shows marked  interval decreased size status post stent placements, while the other collections similar to minimally decreased in size. No new organized intra-abdominal collections are identified.  2.Midline abdominal wall incision with underlying fluid and inflammatory changes are also similar to prior.   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 However, there is redemonstration of closely approximating transverse colon to the anterior abdominal wall.  4.Small left-sided pleural effusion with associated passive atelectasis.  5.Small volume left upper quadrant intra-abdominal free fluid and diffuse mesenteric stranding throughout.     Current Medications:  Current Facility-Administered Medications   Medication Dose Route Frequency    acetaminophen (TYLENOL) tablet  500 mg Oral Q4H PRN    adult custom parenteral nutrition   Intravenous Continuous    atorvastatin (LIPITOR) tablet  40 mg Oral Daily with Breakfast    benzonatate (TESSALON) capsule  100 mg Oral Q8H PRN    D5W 250 mL flush bag   Intravenous Q15 Min PRN    [Held by provider] docusate sodium (COLACE) capsule  100 mg Oral 2x/day    elvitegravir-cobicistat-emtricitrabine-tenofovir (GENVOYA) 150-150-200-10 mg per tablet  1 Tablet Oral Daily    gabapentin (NEURONTIN) capsule  300 mg Oral 3x/day    heparin 25,000 units in NS 250 mL infusion  18 Units/kg/hr (Adjusted) Intravenous Continuous    insulin glargine-yfgn 100 units/mL injection  10 Units Subcutaneous NIGHTLY    insulin NPH human 100 units/mL injection  8 Units Subcutaneous 2x/day    insulin R human 100 units/mL injection  10 Units Subcutaneous Q6H    LR premix infusion   Intravenous Continuous    melatonin tablet  6 mg Oral NIGHTLY    methocarbamol (ROBAXIN) tablet  500 mg Oral 4x/day    multivitamin-minerals-folic acid-lycopene-lutein (CERTAVITE SENIOR) tablet  1 Tablet Oral Q24H    NS 250  mL flush bag   Intravenous Q15 Min PRN    NS flush syringe  10-30 mL Intracatheter Q8HRS    NS flush syringe  20-30 mL Intracatheter Q1 MIN PRN    NS flush syringe  2-6 mL Intracatheter Q8HRS    NS flush syringe  2-6 mL Intracatheter Q1 MIN PRN    ondansetron (ZOFRAN) 2 mg/mL injection  4 mg Intravenous Q6H PRN    oxyCODONE (ROXICODONE) immediate release tablet  2.5 mg Oral Q4H PRN    oxyCODONE (ROXICODONE) immediate release tablet  5 mg Oral Q4H PRN    [Held by provider] pantoprazole (PROTONIX) delayed release tablet  40 mg Oral Q12H    prochlorperazine (COMPAZINE) 5 mg/mL injection  10 mg Intravenous Q6H PRN    [Held by provider] sennosides-docusate sodium (SENOKOT-S) 8.6-'50mg'$  per tablet  1 Tablet Oral 2x/day    SSIP insulin R human (HUMULIN R) 100 units/mL injection  0-12 Units Subcutaneous Q6H PRN       [name]. Duanne Limerick, MD   General Surgery  Pager # [phone]      I saw and examined the patient.  I reviewed the resident's note.  I agree with the findings and plan of care as documented in the resident's note.  Any exceptions/additions are edited/noted.    Aaron Edelman A. Cyndi Bender, MD, FACS

## 2021-05-13 NOTE — Nurses Notes (Signed)
PTT therapeutic at 61.4. Per thrombotic protocol, no rate to heparin drip necessary. Current rate is 22 units/kg/hr. This result makes the 4th consecutive value within "goal range". Next PTT to be drawn with daily labs on 4/14.

## 2021-05-13 NOTE — Consults (Signed)
[facility]  Endocrinology Consult  Follow Up Note    [name], [name], 76 y.o. male  Date of Service: 05/13/2021  Date of Birth:  03/23/1945    Hospital Day:  LOS: 40 days     Reason for Consult: : Diabetes management     [name] is a 76 y.o. 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      Subjective:   Patient seen at bedside with his wife at bedside , says he feels worse .  He has been trying to eat more solid yesterday and today  However threw up in few minutes due to nausea .      Current Facility-Administered Medications   Medication Dose Route Frequency Provider Last Rate Last Admin    acetaminophen (TYLENOL) tablet  500 mg Oral Q4H PRN Donna Christen, MD   500 mg at 05/11/21 0841    adult custom parenteral nutrition   Intravenous Continuous Cosner, Adam, PA-C 80 mL/hr at 05/13/21 3299 Solution Verified at 05/13/21 2426    adult custom parenteral nutrition   Intravenous Continuous Cosner, Adam, PA-C        atorvastatin (LIPITOR) tablet  40 mg Oral Daily with Breakfast Nicola Police, MD   40 mg at 05/13/21 0810    benzonatate (TESSALON) capsule  100 mg Oral Q8H PRN [name], MD        D5W 250 mL flush bag   Intravenous Q15 Min PRN Modena Jansky, DO        [Held by provider] docusate sodium (COLACE) capsule  100 mg Oral 2x/day Cosner, Adam, PA-C   100 mg at 04/26/21 8341    elvitegravir-cobicistat-emtricitrabine-tenofovir (GENVOYA) 150-150-200-10 mg per tablet  1 Tablet Oral Daily Monia Pouch, DO   1 Tablet at 05/13/21 0813    gabapentin (NEURONTIN) capsule  300 mg Oral 3x/day Mariea Clonts, MD   300 mg at 05/13/21 0810    heparin 25,000 units in NS 250 mL infusion  18 Units/kg/hr (Adjusted)  Intravenous Continuous Lucile Crater, MD 16.3 mL/hr at 05/13/21 1027 22 Units/kg/hr at 05/13/21 1027    insulin glargine-yfgn 100 units/mL injection  10 Units Subcutaneous NIGHTLY Shafiq, Ramsha, MD   10 Units at 05/12/21 2056    insulin NPH human 100 units/mL injection  10 Units Subcutaneous 2x/day Sadiq, Mehjabeen, MD        insulin R human 100 units/mL injection  12 Units Subcutaneous Q6H Sadiq, Mehjabeen, MD        LR premix infusion   Intravenous Continuous Lucile Crater, MD 25 mL/hr at 05/13/21 1020 Rate Change at 05/13/21 1020    melatonin tablet  6 mg Oral NIGHTLY Donna Christen, MD   6 mg at 05/12/21 2029    methocarbamol (ROBAXIN) tablet  500 mg Oral 4x/day Larna Daughters, MD   500 mg at 05/13/21 9622    multivitamin-minerals-folic acid-lycopene-lutein (CERTAVITE SENIOR) tablet  1 Tablet Oral Q24H Cosner, Adam, PA-C   1 Tablet at 05/12/21 1459    NS 250 mL flush bag   Intravenous Q15 Min PRN Modena Jansky, DO        NS flush syringe  10-30 mL Intracatheter Q8HRS Nicola Police, MD   10 mL at 05/13/21  0622    NS flush syringe  20-30 mL Intracatheter Q1 MIN PRN Nicola Police, MD        NS flush syringe  2-6 mL Intracatheter Q8HRS Modena Jansky, DO   2 mL at 05/13/21 0600    NS flush syringe  2-6 mL Intracatheter Q1 MIN PRN Modena Jansky, DO        ondansetron [facility]) 2 mg/mL injection  4 mg Intravenous Q6H PRN Ranae Palms, DO   4 mg at 05/13/21 1343    oxyCODONE (ROXICODONE) immediate release tablet  2.5 mg Oral Q4H PRN Stacey Drain, DO   2.5 mg at 05/10/21 1400    oxyCODONE (ROXICODONE) immediate release tablet  5 mg Oral Q4H PRN Stacey Drain, DO   5 mg at 05/13/21 1344    [Held by provider] pantoprazole (PROTONIX) delayed release tablet  40 mg Oral Q12H [name], MD   40 mg at 05/08/21 8144    prochlorperazine (COMPAZINE) 5 mg/mL injection  10 mg Intravenous Q6H PRN Stacey Drain, DO   10 mg at 05/13/21 8185    [Held by provider] sennosides-docusate  sodium (SENOKOT-S) 8.6-'50mg'$  per tablet  1 Tablet Oral 2x/day Cosner, Adam, PA-C   1 Tablet at 04/26/21 2148    SSIP insulin R human (HUMULIN R) 100 units/mL injection  0-12 Units Subcutaneous Q6H PRN Malena Peer, MD   2 Units at 05/13/21 6314     Objective:  Temperature: 37.1 C (98.8 F)  Heart Rate: 99  BP (Non-Invasive): (!) 152/83  Respiratory Rate: 16  SpO2: 93 %  General: Chronically ill appearing male in no acute distress.  HEENT: Head normocephalic, atraumatic. Conjunctiva clear. No proptosis or lid lag. Oral mucosa pink and moist.   Thyroid/Neck: No thyromegaly. Trachea midline.  Lungs: Normal respiratory effort.   Cardiac: Regular rate and rhythm.  Abdomen: Soft, non-tender, non-distended. Ostomy in place   Extremities: No edema, erythema, warmth, or tenderness.  Skin: Warm and dry. No visible rashes.  Neuro: sleepy ,no obvious focal neurologic deficit   Psychiatric: Normal mood and affect.      Lab Results   Component Value Date    WBC 16.4 (H) 05/13/2021    RBC 2.64 (L) 05/13/2021    HGB 7.3 (L) 05/13/2021    HCT 23.7 (L) 05/13/2021    MCV 89.8 05/13/2021    MCH 27.7 05/13/2021    MCHC 30.8 (L) 05/13/2021     Lab Results   Component Value Date    SODIUM 139 05/13/2021    POTASSIUM 3.6 05/13/2021    CO2 27 05/13/2021    BUN 21 05/13/2021    CREATININE 0.69 (L) 05/13/2021    GFR >90 05/13/2021    CALCIUM 9.0 05/13/2021    ALBUMIN 2.0 (L) 05/10/2021    ALKPHOS 116 (H) 05/10/2021    ALT 27 05/10/2021    AST 32 05/10/2021    ANIONGAP 6 05/13/2021     Lab Results   Component Value Date    CHOLESTEROL 212 (H) 12/21/2020    HDLCHOL 44 12/21/2020    LDLCHOL 96 12/21/2020    LDLCHOLDIR 89 03/24/2020    TRIG 140 05/12/2021      Lab Results   Component Value Date    HA1C 9.3 (H) 03/17/2021         Recent Labs     05/11/21  1821 05/12/21  0002 05/12/21  9702 05/12/21  1233 05/12/21  1810 05/12/21  2053 05/12/21  2335 05/13/21  0617 05/13/21  1245   [employer]* 212* 192* 177* 173* 200* 196*        Assessment/Recommendations:  [name] is a 76 y.o. 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 Endocrinology is consulted for management of diabetes.     Post Pancreatectomy Diabetes Mellitus  - Most recent HbA1c on 03/17/2021 was 9.3%.  - Home regimen: Lantus 11 units nightly, Novolog 4 units TID AC.   - Current inpatient regimen: Lantus 10 units nightly, Insulin NPH 8  units bid, Humulin R 10  units Q6 hours, and Humulin R moderate sliding scale 4x/day PRN.   -Diet: MNT PROTOCOL FOR DIETITIAN  adult custom parenteral nutrition  DIET NPO - NOW EXCEPT ALL MEDS WITH SIPS OF WATER, EXCEPT ICE CHIPS, EXCEPT SIPS OF WATER  adult custom parenteral nutrition   - TPN currently running at 80 mL/hr.     Patient continues to be on TPN support with clear liquid diet , sugars noted to be in 200's .     - Recommendations:    -with sugars in in 200's ,will increase the NPH to 10  units bid   -will increase  the insulin R to 12 units every 6 hrs for TPN coverage     - continue with Lantus 10 units at night time -this should not be held regardless of the feeing status   -continue with conservative  correctional scale of insulin R every 6 hrs as needed   Thank you for the consult. Endocrine will follow.          Mehjabeen ENMM[facility]  Endocrine fellow    05/13/2021  Department of Endocrine, Diabetes and metabolism       I saw and examined the patient. I reviewed the fellow's note. I agree with the findings and plan of care as documented in the fellow's note. Any exceptions/additions are edited/noted.    Keith Rake, MD, MPH 05/13/2021, 15:52

## 2021-05-13 NOTE — Care Plan (Signed)
Problem: Adult Inpatient Plan of Care  Goal: Plan of Care Review  Outcome: Ongoing (see interventions/notes)  Goal: Patient-Specific Goal (Individualized)  Outcome: Ongoing (see interventions/notes)  Goal: Absence of Hospital-Acquired Illness or Injury  Outcome: Ongoing (see interventions/notes)  Intervention: Identify and Manage Fall Risk  Recent Flowsheet Documentation  Taken 05/13/2021 0800 by Royce Macadamia, RN  Safety Promotion/Fall Prevention: activity supervised  Intervention: Prevent Skin Injury  Recent Flowsheet Documentation  Taken 05/13/2021 0800 by Royce Macadamia, RN  Body Position: supine, head elevated  Skin Protection: adhesive use limited  Intervention: Prevent and Manage VTE (Venous Thromboembolism) Risk  Recent Flowsheet Documentation  Taken 05/13/2021 0800 by Royce Macadamia, RN  VTE Prevention/Management: ambulation promoted  Goal: Optimal Comfort and Wellbeing  Outcome: Ongoing (see interventions/notes)  Intervention: Provide Person-Centered Care  Recent Flowsheet Documentation  Taken 05/13/2021 0800 by Royce Macadamia, RN  Trust Relationship/Rapport:   care explained   choices provided   emotional support provided   empathic listening provided   questions answered   questions encouraged   reassurance provided   thoughts/feelings acknowledged  Goal: Rounds/Family Conference  Outcome: Ongoing (see interventions/notes)     Problem: Fall Injury Risk  Goal: Absence of Fall and Fall-Related Injury  Outcome: Ongoing (see interventions/notes)  Intervention: Identify and Manage Contributors  Recent Flowsheet Documentation  Taken 05/13/2021 0800 by Royce Macadamia, RN  Medication Review/Management: medications reviewed  Intervention: Promote Injury-Free Environment  Recent Flowsheet Documentation  Taken 05/13/2021 0800 by Royce Macadamia, RN  Safety Promotion/Fall Prevention: activity supervised     Problem: Skin Injury Risk Increased  Goal: Skin Health and Integrity  Outcome: Ongoing (see  interventions/notes)  Intervention: Optimize Skin Protection  Recent Flowsheet Documentation  Taken 05/13/2021 0800 by Royce Macadamia, RN  Skin Protection: adhesive use limited  Activity Management: activity adjusted per tolerance  Head of Bed (HOB) Positioning: HOB at 30-45 degrees     Problem: Wound Healing Progression  Goal: Optimal Wound Healing  Outcome: Ongoing (see interventions/notes)  Intervention: Promote Wound Healing  Recent Flowsheet Documentation  Taken 05/13/2021 0800 by Royce Macadamia, RN  Activity Management: activity adjusted per tolerance     Problem: Acute Rehab Services Goal & Intervention Plan  Goal: Bathing Goal  Description: Stand Alone Therapy Goal  Outcome: Ongoing (see interventions/notes)  Goal: Bed Mobility Goal  Description: Stand Alone Therapy Goal  Outcome: Ongoing (see interventions/notes)  Goal: Caregiver Training Goal  Description: Stand Alone Therapy Goal  Outcome: Ongoing (see interventions/notes)  Goal: Cognition Goal  Description: Stand Alone Therapy Goal  Outcome: Ongoing (see interventions/notes)  Goal: Cognition Goals, SLP  Description: Stand Alone Therapy Goal  Outcome: Ongoing (see interventions/notes)  Goal: Communication Goals, SLP  Description: Stand Alone Therapy Goal  Outcome: Ongoing (see interventions/notes)  Goal: Dysphagia Goals, SLP  Description: Stand Alone Therapy Goal  Outcome: Ongoing (see interventions/notes)  Goal: Eating Self-Feeding Goal  Description: Stand Alone Therapy Goal  Outcome: Ongoing (see interventions/notes)  Goal: Gait Training Goal  Description: Stand Alone Therapy Goal  Outcome: Ongoing (see interventions/notes)  Goal: Grooming Goal  Description: Stand Alone Therapy Goal  Outcome: Ongoing (see interventions/notes)  Goal: Home Management Goal  Description: Stand Alone Therapy Goal  Outcome: Ongoing (see interventions/notes)  Goal: Interprofessional Goal  Description: Stand Alone Therapy Goal  Outcome: Ongoing (see  interventions/notes)  Goal: LB Dressing Goal  Description: Stand Alone Therapy Goal  Outcome: Ongoing (see interventions/notes)  Goal: Occupational Therapy Goals  Description: Stand Alone Therapy Goal  Outcome: Ongoing (see interventions/notes)  Goal: Physical  Therapy Goal  Description: Stand Alone Therapy Goal  Outcome: Ongoing (see interventions/notes)  Goal: Range of Motion Goal  Description: Stand Alone Therapy Goal  Outcome: Ongoing (see interventions/notes)  Goal: Strength Goal  Description: Stand Alone Therapy Goal  Outcome: Ongoing (see interventions/notes)  Goal: Toileting Goal  Description: Stand Alone Therapy Goal  Outcome: Ongoing (see interventions/notes)  Goal: Goal Transfer Training  Description: Stand Alone Therapy Goal  Outcome: Ongoing (see interventions/notes)  Goal: UB Dressing Goal  Description: Stand Alone Therapy Goal  Outcome: Ongoing (see interventions/notes)

## 2021-05-13 NOTE — Care Management Notes (Signed)
[place] Management Note    Patient Name: [name]  Date of Birth: [birth year]  Sex: male  Date/Time of Admission: 04/03/2021  2:01 PM  Room/Bed: 961/A  Payor: [employer] / Plan: Jolayne Panther [employer]AC[employer]/OPTUM / Product Type: Managed Care /    LOS: 40 days   Primary Care Providers:  [place] (General)    Admitting Diagnosis:  Intra abdominal hemorrhage [R58]    Assessment:      05/13/21 1239   Assessment Details   Assessment Type Continued Assessment   Date of Care Management Update 05/13/21   Date of Next DCP Update 05/14/21   Care Management Plan   Discharge Planning Status plan in progress   Projected Discharge Date 05/17/21   Discharge plan discussed with: Patient   CM will evaluate for rehabilitation potential yes   Patient choice offered to patient/family no   Facility or Agency Preferences [facility]   Discharge Needs Assessment   Discharge Facility/Level of Care Needs Acute Rehab Placement/Return (not psych)(code 62)       Per service, pt not medically ready for d/c this week.  [facility] following along for possible admission upon d/c.      Discharge Plan:  Acute Rehab Placement/Return (not psych) (code 33)      The patient will continue to be evaluated for developing discharge needs.     Case Manager: Joella Prince, [place]  Phone: [phone]

## 2021-05-13 NOTE — Care Plan (Signed)
05/13/21 1531   Therapist Pager   PT Assigned/ Pager # [name] [phone]   Rehab Session   Document Type rehab contact note   Total PT Minutes: 1   Basic Mobility Am-PAC/6Clicks Score (APPROVED Staff)   Patient Mobility Barrier Patient declined treatment     PT checked on pt at b/s for tx. Per nurse, pt has been nauseated today and may not tol activity. Pt was asleep. Per wife, he had his first solid food this AM and immediately threw it up. PT will follow for tx tomorrow.  Gillian Scarce, PT  Pager [phone]

## 2021-05-14 ENCOUNTER — Encounter (HOSPITAL_COMMUNITY): Payer: Self-pay | Admitting: Vascular & Interventional Radiology

## 2021-05-14 DIAGNOSIS — R1012 Left upper quadrant pain: Secondary | ICD-10-CM

## 2021-05-14 LAB — POC BLOOD GLUCOSE (RESULTS)
GLUCOSE, POC: 172 mg/dl — ABNORMAL HIGH (ref 70–105)
GLUCOSE, POC: 185 mg/dl — ABNORMAL HIGH (ref 70–105)
GLUCOSE, POC: 189 mg/dl — ABNORMAL HIGH (ref 70–105)
GLUCOSE, POC: 198 mg/dl — ABNORMAL HIGH (ref 70–105)
GLUCOSE, POC: 199 mg/dl — ABNORMAL HIGH (ref 70–105)
GLUCOSE, POC: 216 mg/dl — ABNORMAL HIGH (ref 70–105)
GLUCOSE, POC: 268 mg/dl — ABNORMAL HIGH (ref 70–105)

## 2021-05-14 LAB — TRIGLYCERIDE: TRIGLYCERIDES: 153 mg/dL — ABNORMAL HIGH (ref <150)

## 2021-05-14 LAB — PTT (PARTIAL THROMBOPLASTIN TIME)
APTT: 55.2 seconds — ABNORMAL HIGH (ref 24.2–37.5)
APTT: 60.8 seconds — ABNORMAL HIGH (ref 24.2–37.5)
APTT: 61 seconds — ABNORMAL HIGH (ref 24.2–37.5)

## 2021-05-14 LAB — BASIC METABOLIC PANEL
ANION GAP: 8 mmol/L (ref 4–13)
BUN/CREA RATIO: 26 — ABNORMAL HIGH (ref 6–22)
BUN: 20 mg/dL (ref 8–25)
CALCIUM: 9.5 mg/dL (ref 8.8–10.2)
CHLORIDE: 106 mmol/L (ref 96–111)
CO2 TOTAL: 26 mmol/L (ref 23–31)
CREATININE: 0.78 mg/dL (ref 0.75–1.35)
ESTIMATED GFR: >90 mL/min/BSA (ref >=60)
GLUCOSE: 192 mg/dL — ABNORMAL HIGH (ref 65–125)
POTASSIUM: 4 mmol/L (ref 3.5–5.1)
SODIUM: 140 mmol/L (ref 136–145)

## 2021-05-14 LAB — CBC WITH DIFF
BASOPHIL #: 0.1 10*3/uL (ref <=0.20)
BASOPHIL %: 1 %
EOSINOPHIL #: 0.14 10*3/uL (ref <=0.50)
EOSINOPHIL %: 1 %
HCT: 28.1 % — ABNORMAL LOW (ref 38.9–52.0)
HGB: 8.1 g/dL — ABNORMAL LOW (ref 13.4–17.5)
IMMATURE GRANULOCYTE #: 0.17 10*3/uL — ABNORMAL HIGH (ref <0.10)
IMMATURE GRANULOCYTE %: 1 % (ref 0–1)
LYMPHOCYTE #: 7.94 10*3/uL — ABNORMAL HIGH (ref 1.00–4.80)
LYMPHOCYTE %: 44 %
MCH: 27.1 pg (ref 26.0–32.0)
MCHC: 28.8 g/dL — ABNORMAL LOW (ref 31.0–35.5)
MCV: 94 fL (ref 78.0–100.0)
MONOCYTE #: 1.81 10*3/uL — ABNORMAL HIGH (ref 0.20–1.10)
MONOCYTE %: 10 %
NEUTROPHIL #: 7.82 10*3/uL — ABNORMAL HIGH (ref 1.50–7.70)
NEUTROPHIL %: 43 %
PLATELETS: 519 10*3/uL — ABNORMAL HIGH (ref 150–400)
RBC: 2.99 10*6/uL — ABNORMAL LOW (ref 4.50–6.10)
RDW-CV: 17.8 % — ABNORMAL HIGH (ref 11.5–15.5)
WBC: 18 10*3/uL — ABNORMAL HIGH (ref 3.7–11.0)

## 2021-05-14 LAB — ADULT ROUTINE BLOOD CULTURE, SET OF 2 BOTTLES (BACTERIA AND YEAST)
BLOOD CULTURE, ROUTINE: NO GROWTH
BLOOD CULTURE, ROUTINE: NO GROWTH

## 2021-05-14 LAB — PHOSPHORUS: PHOSPHORUS: 3.4 mg/dL (ref 2.3–4.0)

## 2021-05-14 LAB — MAGNESIUM: MAGNESIUM: 2 mg/dL (ref 1.8–2.6)

## 2021-05-14 MED ORDER — OLANZAPINE 5 MG TABLET
5.0000 mg | ORAL_TABLET | Freq: Every evening | ORAL | Status: DC
Start: 2021-05-14 — End: 2021-05-15
  Administered 2021-05-14: 5 mg via ORAL
  Filled 2021-05-14 (×2): qty 1

## 2021-05-14 MED ORDER — HEPARIN (PORCINE) 5,000 UNITS/ML BOLUS FOR DOSE ADJUSTMENT
25.0000 [IU]/kg | Freq: Once | INTRAMUSCULAR | Status: AC
Start: 2021-05-14 — End: 2021-05-14
  Administered 2021-05-14: 2000 [IU] via INTRAVENOUS
  Filled 2021-05-14: qty 1

## 2021-05-14 MED ORDER — WATER FOR INJECTION, STERILE INTRAVENOUS SOLUTION
INTRAVENOUS | Status: AC
Start: 2021-05-14 — End: 2021-05-15
  Filled 2021-05-14: qty 1000

## 2021-05-14 MED ORDER — INSULIN REGULAR HUMAN 100 UNIT/ML INJECTION - FOR TUBE FEED
10.0000 [IU] | INTRAMUSCULAR | Status: DC
Start: 2021-05-14 — End: 2021-05-15
  Administered 2021-05-14 – 2021-05-15 (×3): 10 [IU] via SUBCUTANEOUS
  Filled 2021-05-14: qty 0.1

## 2021-05-14 NOTE — Nurses Notes (Signed)
#  [phone] room 961 states even after zofran and compazine he is still very nauseous and unable to take medications.   On call [facility] resident paged.   Beverely Low, LPN  08/10/6267, 48:54

## 2021-05-14 NOTE — Consults (Signed)
[place]    Date of Service:  05/14/2021  Requesting MD: Dr. 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  Persistent nausea/vomiting minimally relieved from Zofran/Compazine.    On exam the patient is sitting up in bed with his wife at the bedside, he is awake, alert and oriented x3. The patient reports that his nausea is continuous, he reports very minimal relief with Zofran and Compazine. Per his report he gets one of these medications every 3 hours but still has unrelenting nausea with acid build up until it refluxes up and he needs to use the yanker to remove it. The patient reports the Zofran takes longer to work but lasts longer, the Compazine works more quickly but wears off fast. The patient also reports significant anticipatory nausea surrounding taking oral medications. He states the thought of having to take pills makes him start gagging.  We spoke about some medications that may help relieve his symptoms including Zyprexa and re-starting his Protonix. I spoke with he and his wife at length about Zyprexa and explained the warnings that come with this medication. The patient verbalizes understanding and states he wants to try the medicine because he would do whatever it takes to help with the nausea.   Given the patients anticipatory nausea he may benefit from Ativan- low dose PRN if the Zyprexa is ineffective. Of note patients last BM was 4/12,, was moving bowels daily per his report.    All questions and concerns were answered, Adam, APP with primary team updated on recommendations. Supportive care will follow.     Recommend:  -Start Zyprexa '5mg'$  PO nightly for nausea  (QTC 455)  -Restart Protonix if  not contraindicated by GI  -Continue Zofran '4mg'$  IV every 6 hours PRN  -Continue Compazine 10 mg IV every 6 hours PRN  -Consider re-starting Senna 1 tab BID as patient reports some constipation over the last few days     Decision Making Capacity:  Yes    Advance Directives prior to consultation:  MPOA- Yes, Name: Kasten Leveque and Phone Number [phone];  Living Will- No;Healthcare Surrogate:  No    Did this encounter involve a discussion of hospice? No    Information Obtained from: patient, spouse and history reviewed via medical record     HPI/Discussion:   "76 y.o., White male who presents with abdominal pain.  This patient has a recent history of exploratory laparotomy and distal pancreatectomy and splenectomy for IPMN lesion at [facility].  Postoperatively he developed a small pulmonary embolism own was started on Eliquis.  He started to develop abdominal pain over the last 24-48 hours.  He sought medical attention in the emergency department and a CT scan was obtained that shows active contrast extravasation at the surgical bed.  General surgery was consulted stat for evaluation.  This patient is in obvious distress.  He is unable to answer questions currently.  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 His blood cultures on 3/9 were growing pseudomonas as well as cultures from his JP drain. The patient is on TPN and has continued to have nausea/vomiting  throughout his stay. Supportive care consulted for symptom management.     Past Family, Medical, and Social History and ROS see H&P on 04/03/2021 by Dr. Stephanie Acre     Symptom Assessment:    Pain: Numeric 0  Other symptoms present: Nausea and Vomiting    Pt/Family unit dynamics: Patient was living at home with his wife prior to [place]:  Not assessed    Medical orders prior to consultation: Post - POST form  not discussed DNR Card - No  Code Status:  Full  Treatment Limitations prior to consultation:  None        Exam:  Temperature: 36.6 C (97.9 F)  Heart Rate: (!) 104 (lpn notified)  BP (Non-Invasive): (!) 166/97 (lpn notified)  Respiratory Rate: 16  SpO2: 92 %    Constitutional:  appears chronically ill, acutely ill, appears stated age, mild distress and vital signs reviewed  Eyes:  Conjunctiva clear., Pupils equal and round.   ENT:  Mouth mucous membranes moist.   Neck:  supple, symmetrical, trachea midline  Respiratory:  Clear to auscultation bilaterally.   Cardiovascular:  regular rate and rhythm  Gastrointestinal:  soft, non-tender, positive bowel sounds  Musculoskeletal:  Head atraumatic and normocephalic and AROM  Integumentary:  Skin warm and dry  Neurologic:  Alert and oriented x3    Ventilator Settings: N/A  HFNC Settings:N/A  BIPAP Settings:N/A  Automatic Implantable Cardiac Defibrillator  No    Palliative Performance Scale-  50    Labs:    Lab Results Today:    Results for orders placed or performed during the hospital encounter of 04/03/21 (from the past 24 hour(s))   POC BLOOD GLUCOSE (RESULTS)   Result Value Ref Range    GLUCOSE, POC 175 (H) 70 - 105 mg/dl   POC BLOOD GLUCOSE (RESULTS)   Result Value Ref Range    GLUCOSE, POC 178 (H) 70 - 105 mg/dl   POC BLOOD GLUCOSE (RESULTS)   Result Value Ref Range    GLUCOSE, POC 198 (H) 70 - 105 mg/dl   PTT (PARTIAL THROMBOPLASTIN TIME) - AM ONCE   Result Value Ref Range    APTT 55.2 (H) 24.2 - 37.5 seconds   BASIC METABOLIC PANEL   Result Value Ref Range    SODIUM 140 136 - 145 mmol/L    POTASSIUM 4.0 3.5 - 5.1 mmol/L    CHLORIDE 106 96 - 111 mmol/L    CO2 TOTAL 26 23 - 31 mmol/L    ANION GAP 8 4 - 13 mmol/L    CALCIUM 9.5 8.8 - 10.2 mg/dL    GLUCOSE 192 (H) 65 - 125 mg/dL    BUN 20 8 - 25 mg/dL    CREATININE 0.78 0.75 - 1.35 mg/dL    BUN/CREA RATIO 26 (H) 6 - 22    ESTIMATED GFR >90 >=60 mL/min/BSA   MAGNESIUM   Result Value Ref Range    MAGNESIUM 2.0 1.8 - 2.6  mg/dL   PHOSPHORUS   Result Value Ref Range    PHOSPHORUS 3.4 2.3 - 4.0 mg/dL   CBC WITH DIFF   Result Value Ref Range    WBC 18.0 (H) 3.7 - 11.0 x10^3/uL    RBC 2.99 (L) 4.50 - 6.10 x10^6/uL    HGB 8.1 (L) 13.4 - 17.5 g/dL    HCT 28.1 (L) 38.9 - 52.0 %    MCV 94.0 78.0 - 100.0 fL    MCH 27.1 26.0 - 32.0 pg  MCHC 28.8 (L) 31.0 - 35.5 g/dL    RDW-CV 17.8 (H) 11.5 - 15.5 %    PLATELETS 519 (H) 150 - 400 x10^3/uL    NEUTROPHIL % 43 %    LYMPHOCYTE % 44 %    MONOCYTE % 10 %    EOSINOPHIL % 1 %    BASOPHIL % 1 %    NEUTROPHIL # 7.82 (H) 1.50 - 7.70 x10^3/uL    LYMPHOCYTE # 7.94 (H) 1.00 - 4.80 x10^3/uL    MONOCYTE # 1.81 (H) 0.20 - 1.10 x10^3/uL    EOSINOPHIL # 0.14 <=0.50 x10^3/uL    BASOPHIL # 0.10 <=0.20 x10^3/uL    IMMATURE GRANULOCYTE % 1 0 - 1 %    IMMATURE GRANULOCYTE # 0.17 (H) <0.10 x10^3/uL   TRIGLYCERIDE   Result Value Ref Range    TRIGLYCERIDES 153 (H) <150 mg/dL   POC BLOOD GLUCOSE (RESULTS)   Result Value Ref Range    GLUCOSE, POC 199 (H) 70 - 105 mg/dl   POC BLOOD GLUCOSE (RESULTS)   Result Value Ref Range    GLUCOSE, POC 172 (H) 70 - 105 mg/dl   POC BLOOD GLUCOSE (RESULTS)   Result Value Ref Range    GLUCOSE, POC 268 (H) 70 - 105 mg/dl       Imaging Studies:  KUB from 05/13/2021 reviewed:  FINDINGS:  Scattered bowel gas is seen throughout the abdomen and pelvis in nondilated bowel loops. Gas is seen at the level of rectum. Mild stool burden. Small left pleural effusion and atelectasis is noted. An IVC filter is in place. Stents are noted projecting over the gastric body. Midline surgical staples are in place.    IMPRESSION:  1.Nonobstructed bowel gas pattern with mild stool burden.  2.Small left pleural effusion.    Consultant:  Charise Carwin, APRN,FNP-BC    On the day of the encounter, a total of  45 minutes was spent on this patient encounter including review of historical information, examination, documentation and post-visit activities. The time documented excludes procedural time.      Charise Carwin, APRN,FNP-BC

## 2021-05-14 NOTE — Care Plan (Signed)
Occupational Therapy Note     05/14/21 1205   Therapist Pager   OT Assigned/ Pager # Jeyren Danowski [phone]   [place] Type rehab contact note   Total OT Minutes: 0   Daily Activity AM-PAC/6-clicks Score   Patient Mobility Barrier Patient unstable or too ill to participate     OT attempted to provide care to [name] today at 12:05. Care could not be delivered at that time due to the patient was ill and not able to participate in care. Pt recently participated in treatment session with PT and had just returned to bed with continued nausea and overall discomfort. Will follow up with OT intervention at a later time as pt is available and appropriate to participate.    Nicola Heinemann, MOT, OTR/L  Pager [phone]

## 2021-05-14 NOTE — Care Management Notes (Signed)
[facility]  Care Management Note    Patient Name: [name]  Date of Birth: [birth year]  Sex: male  Date/Time of Admission: 04/03/2021  2:01 PM  Room/Bed: 961/A  Payor: [facility] [employer] [employer] / Plan: Jolayne Panther VACCN/OPTUM / Product Type: Managed Care /    LOS: 41 days   Primary Care Providers:  [place] (General)    Admitting Diagnosis:  Intra abdominal hemorrhage [R58]    Assessment:      05/14/21 1300   Assessment Details   Assessment Type Continued Assessment   Date of Care Management Update 05/14/21   Date of Next DCP Update 05/17/21   Care Management Plan   Discharge Planning Status plan in progress   Projected Discharge Date 05/19/21   CM will evaluate for rehabilitation potential yes   Patient choice offered to patient/family yes   Form for patient choice reviewed/signed and on chart no   Facility or Agency Preferences [facility]   Discharge Needs Assessment   Discharge Facility/Level of Care Needs Acute Rehab Placement/Return (not psych)(code 62)       Per service, pt being monitored for anticoagulation and diet.  Plan for possibly being d/c ready sometime midweek next week.  [facility] following for possible admission on d/c.      Discharge Plan:  Acute Rehab Placement/Return (not psych) (code 62)      The patient will continue to be evaluated for developing discharge needs.     Case Manager: Joella Prince, [place]  Phone: [phone]

## 2021-05-14 NOTE — Nurses Notes (Addendum)
PTT 55.2. Per thrombotic protocol, heparin drip increased by 1 unit/kg/hr. 25 unit bolus also administered. New rate is 23 units/kg/hr. Next PTT scheduled for 1300 on 4/14.

## 2021-05-14 NOTE — Nurses Notes (Signed)
Per thrombotic protocol, PTT of 61.0 is therapeutic. Hence, no change to heparin drip necessary. Heparin drip continued at 23 units/kg/hr. Next PTT scheduled for 0100 on 4/15.

## 2021-05-14 NOTE — Nurses Notes (Signed)
9E [address]  Patient vomiting with med administration. On clear liquid diet currently. Zofran prn just administered. Service, Venita Sheffield, paged with update.

## 2021-05-14 NOTE — Consults (Signed)
[facility]  Endocrinology Consult  Follow Up Note    [name], [name], 76 y.o. male  Date of Service: 05/14/2021  Date of Birth:  1945/11/28    Hospital Day:  LOS: 41 days     Reason for Consult: : Diabetes management     [name] is a 76 y.o. 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      Subjective:   Patient seen at bedside with his wife at bedside , he is sitting on chair more alert and conversant than before . Wife says he has been throwing up all day yesterday and has been nauseous today as well .  TPN running at 80 ml/hr .  Team notified us about plans to adjust the TPN schedule from tonight  .      Current Facility-Administered Medications   Medication Dose Route Frequency Provider Last Rate Last Admin    acetaminophen (TYLENOL) tablet  500 mg Oral Q4H PRN Donna Christen, MD   500 mg at 05/11/21 0841    adult custom parenteral nutrition   Intravenous Continuous Cosner, Adam, PA-C 80 mL/hr at 05/14/21 0757 Solution Verified at 05/14/21 0757    atorvastatin (LIPITOR) tablet  40 mg Oral Daily with Breakfast Nicola Police, MD   40 mg at 05/13/21 0810    benzonatate (TESSALON) capsule  100 mg Oral Q8H PRN [name], MD        D5W 250 mL flush bag   Intravenous Q15 Min PRN Modena Jansky, DO        [Held by provider] docusate sodium (COLACE) capsule  100 mg Oral 2x/day Cosner, Adam, PA-C   100 mg at 04/26/21 7846    elvitegravir-cobicistat-emtricitrabine-tenofovir (GENVOYA) 150-150-200-10 mg per tablet  1 Tablet Oral Daily Monia Pouch, DO   1 Tablet at 05/14/21 9629    gabapentin (NEURONTIN) capsule  300 mg Oral 3x/day Mariea Clonts, MD   300 mg at 05/13/21 0810    heparin 25,000 units in NS 250 mL  infusion  18 Units/kg/hr (Adjusted) Intravenous Continuous Lucile Crater, MD 17.1 mL/hr at 05/14/21 0756 23 Units/kg/hr at 05/14/21 0756    insulin glargine-yfgn 100 units/mL injection  10 Units Subcutaneous NIGHTLY Shafiq, Ramsha, MD   10 Units at 05/13/21 2141    insulin NPH human 100 units/mL injection  10 Units Subcutaneous 2x/day Malena Peer, MD   10 Units at 05/14/21 0940    insulin R human 100 units/mL injection  12 Units Subcutaneous Q6H Malena Peer, MD   12 Units at 05/14/21 0537    LR premix infusion   Intravenous Continuous Lucile Crater, MD 25 mL/hr at 05/13/21 1020 Rate Change at 05/13/21 1020    melatonin tablet  6 mg Oral NIGHTLY Donna Christen, MD   6 mg at 05/12/21 2029    methocarbamol (ROBAXIN) tablet  500 mg Oral 4x/day Larna Daughters, MD   500 mg at 05/13/21 5284    multivitamin-minerals-folic acid-lycopene-lutein (CERTAVITE SENIOR) tablet  1 Tablet Oral Q24H Cosner, Adam, PA-C   1 Tablet at 05/12/21 1459    NS 250 mL flush bag   Intravenous Q15 Min PRN Modena Jansky, DO        NS flush syringe  10-30 mL  Intracatheter Q8HRS Nicola Police, MD   10 mL at 05/14/21 0540    NS flush syringe  20-30 mL Intracatheter Q1 MIN PRN Nicola Police, MD        NS flush syringe  2-6 mL Intracatheter Q8HRS Modena Jansky, DO   6 mL at 05/14/21 0541    NS flush syringe  2-6 mL Intracatheter Q1 MIN PRN Modena Jansky, DO        ondansetron [facility]) 2 mg/mL injection  4 mg Intravenous Q6H PRN Ranae Palms, DO   4 mg at 05/14/21 7371    oxyCODONE (ROXICODONE) immediate release tablet  2.5 mg Oral Q4H PRN Stacey Drain, DO   2.5 mg at 05/10/21 1400    oxyCODONE (ROXICODONE) immediate release tablet  5 mg Oral Q4H PRN Stacey Drain, DO   5 mg at 05/14/21 0510    [Held by provider] pantoprazole (PROTONIX) delayed release tablet  40 mg Oral Q12H [name], MD   40 mg at 05/08/21 0626    prochlorperazine (COMPAZINE) 5 mg/mL injection  10 mg Intravenous Q6H PRN  Stacey Drain, DO   10 mg at 05/14/21 9485    [Held by provider] sennosides-docusate sodium (SENOKOT-S) 8.6-'50mg'$  per tablet  1 Tablet Oral 2x/day Cosner, Adam, PA-C   1 Tablet at 04/26/21 2148    SSIP insulin R human (HUMULIN R) 100 units/mL injection  0-12 Units Subcutaneous Q6H PRN Malena Peer, MD   2 Units at 05/14/21 0537     Objective:  Temperature: 36.7 C (98.1 F)  Heart Rate: 96  BP (Non-Invasive): (!) 154/89 (lpn notified)  Respiratory Rate: 16  SpO2: 94 %  General: Chronically ill appearing male in no acute distress.  HEENT: Head normocephalic, atraumatic. Conjunctiva clear. No proptosis or lid lag. Oral mucosa pink and moist.   Thyroid/Neck: No thyromegaly. Trachea midline.  Lungs: Normal respiratory effort.   Cardiac: Regular rate and rhythm.  Abdomen: Soft, non-tender, non-distended. Ostomy in place   Extremities: No edema, erythema, warmth, or tenderness.  Skin: Warm and dry. No visible rashes.  Neuro: sleepy ,no obvious focal neurologic deficit   Psychiatric: Normal mood and affect.      Lab Results   Component Value Date    WBC 18.0 (H) 05/14/2021    RBC 2.99 (L) 05/14/2021    HGB 8.1 (L) 05/14/2021    HCT 28.1 (L) 05/14/2021    MCV 94.0 05/14/2021    MCH 27.1 05/14/2021    MCHC 28.8 (L) 05/14/2021     Lab Results   Component Value Date    SODIUM 140 05/14/2021    POTASSIUM 4.0 05/14/2021    CO2 26 05/14/2021    BUN 20 05/14/2021    CREATININE 0.78 05/14/2021    GFR >90 05/14/2021    CALCIUM 9.5 05/14/2021    ALBUMIN 2.0 (L) 05/10/2021    ALKPHOS 116 (H) 05/10/2021    ALT 27 05/10/2021    AST 32 05/10/2021    ANIONGAP 8 05/14/2021     Lab Results   Component Value Date    CHOLESTEROL 212 (H) 12/21/2020    HDLCHOL 44 12/21/2020    LDLCHOL 96 12/21/2020    LDLCHOLDIR 89 03/24/2020    TRIG 153 (H) 05/14/2021      Lab Results   Component Value Date    HA1C 9.3 (H) 03/17/2021         Recent Labs     05/12/21  1233 05/12/21  1810 05/12/21  2053  05/12/21  2335 05/13/21  0617 05/13/21  1245  05/13/21  1814 05/13/21  2139 05/14/21  0000 05/14/21  0533 05/14/21  0932   GLUIP 212* 192* 177* 173* 200* 196* 175* 178* 198* 199* 172*       Assessment/Recommendations:  JOB HOLTSCLAW is a 76 y.o. 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 Endocrinology is consulted for management of diabetes.     Post Pancreatectomy Diabetes Mellitus  - Most recent HbA1c on 03/17/2021 was 9.3%.  - Home regimen: Lantus 11 units nightly, Novolog 4 units TID AC.   - Current inpatient regimen: Lantus 10 units nightly, Insulin NPH 10  units bid, Humulin R 12  units Q6 hours, and Humulin R conservative  sliding scale 4x/day PRN.   -Diet: MNT PROTOCOL FOR DIETITIAN  adult custom parenteral nutrition  DIET CLEAR LIQUID   - TPN currently running at 80 mL/hr.     Patient did not receive his TPN for most of yesterday due to being off floor for procedures .  Per team plans to change the schedule from continuous to cycle it at night time for 18 hrs starting tonight at 8 pm , it will run till 2 pm tomorrow  .     - Recommendations:  -for today will have him take last dose of insulin R 12 units at noon time and will d/c it afterwards   -from 8 pm tonight , With the new schedule will plan to change insulin R to every 6 hrs for duration of TPN,will do 10 units every 6 hrs starting at 8 pm- Midnight and 6 am . Insulin R will last for at least 6-8 hr and would avoid any scheduled insulin between 2 pm-8 pm when TPN is off .  -continue with correctional scale regimen with insulin R every 6 hrs per conservative sliding scale   - continue with insulin NPH 10 units bid , the am dose may need to be reduced depending on his later afternoon levels   -continue with Lantus 10 units - this should not be held even  if he off TPN/NPO    Thank you for the consult. Endocrine will follow.      Mehjabeen XOVAN  Endocrine fellow    05/14/2021  Department of Endocrine, Diabetes and metabolism       I saw and examined the patient. I reviewed the fellow's note. I agree with the findings and plan of care as documented in the fellow's note. Any exceptions/additions are edited/noted.    Keith Rake, MD, MPH 05/14/2021, 12:00

## 2021-05-14 NOTE — Progress Notes (Signed)
[facility]  Surgical Oncology  Progress Note      [name], [name], 76 y.o. male  Date of Birth:  22-Nov-1945  Date of Admission:  04/03/2021  Date of service: 05/14/2021    Assessment:  This is a 76 y.o. 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 He underwent massive transfusion protocol and ex lap on 04/03/21 at an outside facility with ligation of a bleeding vessel in the liver bed. Patient was subsequently transferred to [facility] postoperaively, intubated. Patient was subsequently extubated later that day on 3/4. 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    Plan/Recommendations:  - Continue heparin gtt  - Diet: CLD  - Peripancreatic fluid collections   - Advanced GI performed EUS with cystogastrostomy 4/6              - Blood cultures 3/9: + pseudomonas    - Cultures form R JP: Pseudomonas and enterococcus              - ID consulted; Daptomycin and meropenum (completed 4/10)     - PE   - s/p IVC filter placement 3/17  - IVF: TPN 80 cc/hr, plan to cycle today  - Midline fistula with ostomy bag to monitor output  - OOB TID, encourage ambulation  -Pulmonary toilet, keep IS accessible at bedside  - PT/OT ordered, encourage pt to work with therapy  - Dispo: Floor      Subjective:   Reports continued nausea. Relieved somewhat with additional medication. Continues to endorse nausea. tPA given within PICC line.      Objective  Filed Vitals:    05/13/21 1927 05/14/21 0004 05/14/21 0313 05/14/21 0610   BP: (!) 158/89 (!) 163/94 (!) 164/94    Pulse: 95 94 92    Resp: '16 16 16 16   '$ Temp: 36.8 C (98.2 F) 37.1 C (98.8 F) 37.1 C (98.8 F)    SpO2: 90% 90% 91%      Physical Exam:   GEN:  AOx4, resting in bed, no acute distress  PULM: Normal respiratory effort. Equal/symmetric chest rise.  CV:  Pink, well perfused,  RRR  ABD:   Abdomen soft, nontender, nondistended. Lower midline incision with thin tan drainage in Eakins pouch    MS: Atraumatic. Moves all extremities.  NEURO:   Alert and oriented to person, place and time.  Cranial nerves grossly intact.    Integumentary:  Pink, warm, and dry  PSYCHOSOCIAL: Pleasant.  Normal affect.     Labs  Results for orders placed or performed during the hospital encounter of 04/03/21 (from the past 24 hour(s))   PTT (PARTIAL THROMBOPLASTIN TIME) - AM ONCE   Result Value Ref Range    APTT 55.2 (H) 24.2 - 37.5 seconds    Narrative    Therapeutic range for unfractionated heparin is 60-100 seconds.   BASIC METABOLIC PANEL   Result Value Ref Range    SODIUM 140 136 - 145 mmol/L    POTASSIUM 4.0 3.5 - 5.1 mmol/L    CHLORIDE 106 96 - 111 mmol/L    CO2 TOTAL 26 23 - 31 mmol/L    ANION GAP 8 4 - 13 mmol/L    CALCIUM 9.5 8.8 - 10.2 mg/dL    GLUCOSE 192 (H) 65 - 125 mg/dL  BUN 20 8 - 25 mg/dL    CREATININE 0.78 0.75 - 1.35 mg/dL    BUN/CREA RATIO 26 (H) 6 - 22    ESTIMATED GFR >90 >=60 mL/min/BSA    Narrative    Hemolysis can alter results at this level (slight).   CBC/DIFF    Narrative    The following orders were created for panel order CBC/DIFF.  Procedure                               Abnormality         Status                     ---------                               -----------         ------                     CBC WITH DIFF[[id number]]                Abnormal            Final result                 Please view results for these tests on the individual orders.   MAGNESIUM   Result Value Ref Range    MAGNESIUM 2.0 1.8 - 2.6 mg/dL    Narrative    Hemolysis can alter results at this level (slight).   PHOSPHORUS   Result Value Ref Range    PHOSPHORUS 3.4 2.3 - 4.0 mg/dL    Narrative    Hemolysis can alter results at this level (slight).   CBC WITH DIFF   Result Value Ref Range    WBC 18.0 (H) 3.7 - 11.0 x10^3/uL    RBC 2.99 (L) 4.50 - 6.10 x10^6/uL    HGB 8.1 (L) 13.4 - 17.5 g/dL    HCT 28.1  (L) 38.9 - 52.0 %    MCV 94.0 78.0 - 100.0 fL    MCH 27.1 26.0 - 32.0 pg    MCHC 28.8 (L) 31.0 - 35.5 g/dL    RDW-CV 17.8 (H) 11.5 - 15.5 %    PLATELETS 519 (H) 150 - 400 x10^3/uL    NEUTROPHIL % 43 %    LYMPHOCYTE % 44 %    MONOCYTE % 10 %    EOSINOPHIL % 1 %    BASOPHIL % 1 %    NEUTROPHIL # 7.82 (H) 1.50 - 7.70 x10^3/uL    LYMPHOCYTE # 7.94 (H) 1.00 - 4.80 x10^3/uL    MONOCYTE # 1.81 (H) 0.20 - 1.10 x10^3/uL    EOSINOPHIL # 0.14 <=0.50 x10^3/uL    BASOPHIL # 0.10 <=0.20 x10^3/uL    IMMATURE GRANULOCYTE % 1 0 - 1 %    IMMATURE GRANULOCYTE # 0.17 (H) <0.10 x10^3/uL    Narrative    Mean Platelet Volume - Not Reported  Platelet Count -Analyzer flag indicated platelet clumps- actual count may be higher.   TRIGLYCERIDE   Result Value Ref Range    TRIGLYCERIDES 153 (H) <150 mg/dL   POC BLOOD GLUCOSE (RESULTS)   Result Value Ref Range    GLUCOSE, POC 196 (H) 70 - 105 mg/dl   POC BLOOD GLUCOSE (RESULTS)   Result Value Ref Range  GLUCOSE, POC 175 (H) 70 - 105 mg/dl   POC BLOOD GLUCOSE (RESULTS)   Result Value Ref Range    GLUCOSE, POC 178 (H) 70 - 105 mg/dl   POC BLOOD GLUCOSE (RESULTS)   Result Value Ref Range    GLUCOSE, POC 198 (H) 70 - 105 mg/dl   POC BLOOD GLUCOSE (RESULTS)   Result Value Ref Range    GLUCOSE, POC 199 (H) 70 - 105 mg/dl       I/O:  Date 05/13/21 0700 - 05/14/21 0659 05/14/21 0700 - 05/15/21 0659   Shift 0700-1459 1500-2259 2300-0659 24 Hour Total 0700-1459 1500-2259 2300-0659 24 Hour Total   INTAKE   P.O. 70   70         Oral 70   70       Shift Total(mL/kg) 70(0.97)   70(0.97)       OUTPUT   Urine(mL/kg/hr) 1800(3.12) 740(1.28) 1035(1.79) 3575(2.07)         Urine (Voided) 1800 740 1035 3575       Shift Total(mL/kg) 1800(24.97) 740(10.26) 6803(21.22) 4825(00.37)       Weight (kg) 72.1 72.1 72.1 72.1 72.1 72.1 72.1 72.1       Radiology:  05/10/21 CT C/A/P   1.Postsurgical changes from distal pancreatectomy and splenectomy with persistent fluid collections along the surgical beds with two percutaneous  drainage catheters noted. The fluid collection most closely approximating the pancreatic body shows marked interval decreased size status post stent placements, while the other collections similar to minimally decreased in size. No new organized intra-abdominal collections are identified.  2.Midline abdominal wall incision with underlying fluid and inflammatory changes are also similar to prior.   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 However, there is redemonstration of closely approximating transverse colon to the anterior abdominal wall.  4.Small left-sided pleural effusion with associated passive atelectasis.  5.Small volume left upper quadrant intra-abdominal free fluid and diffuse mesenteric stranding throughout.     Current Medications:  Current Facility-Administered Medications   Medication Dose Route Frequency   . acetaminophen (TYLENOL) tablet  500 mg Oral Q4H PRN   . adult custom parenteral nutrition   Intravenous Continuous   . atorvastatin (LIPITOR) tablet  40 mg Oral Daily with Breakfast   . benzonatate (TESSALON) capsule  100 mg Oral Q8H PRN   . D5W 250 mL flush bag   Intravenous Q15 Min PRN   . [Held by provider] docusate sodium (COLACE) capsule  100 mg Oral 2x/day   . elvitegravir-cobicistat-emtricitrabine-tenofovir (GENVOYA) 150-150-200-10 mg per tablet  1 Tablet Oral Daily   . gabapentin (NEURONTIN) capsule  300 mg Oral 3x/day   . heparin 25,000 units in NS 250 mL infusion  18 Units/kg/hr (Adjusted) Intravenous Continuous   . insulin glargine-yfgn 100 units/mL injection  10 Units Subcutaneous NIGHTLY   . insulin NPH human 100 units/mL injection  10 Units Subcutaneous 2x/day   . insulin R human 100 units/mL injection  12 Units Subcutaneous Q6H   . LR premix infusion   Intravenous Continuous   . melatonin tablet  6 mg Oral NIGHTLY   . methocarbamol (ROBAXIN) tablet  500  mg Oral 4x/day   . multivitamin-minerals-folic acid-lycopene-lutein (CERTAVITE SENIOR) tablet  1 Tablet Oral Q24H   . NS 250 mL flush bag   Intravenous Q15 Min PRN   . NS flush syringe  10-30 mL Intracatheter Q8HRS   . NS flush  syringe  20-30 mL Intracatheter Q1 MIN PRN   . NS flush syringe  2-6 mL Intracatheter Q8HRS   . NS flush syringe  2-6 mL Intracatheter Q1 MIN PRN   . ondansetron (ZOFRAN) 2 mg/mL injection  4 mg Intravenous Q6H PRN   . oxyCODONE (ROXICODONE) immediate release tablet  2.5 mg Oral Q4H PRN   . oxyCODONE (ROXICODONE) immediate release tablet  5 mg Oral Q4H PRN   . [Held by provider] pantoprazole (PROTONIX) delayed release tablet  40 mg Oral Q12H   . prochlorperazine (COMPAZINE) 5 mg/mL injection  10 mg Intravenous Q6H PRN   . [Held by provider] sennosides-docusate sodium (SENOKOT-S) 8.6-'50mg'$  per tablet  1 Tablet Oral 2x/day   . SSIP insulin R human (HUMULIN R) 100 units/mL injection  0-12 Units Subcutaneous Q6H PRN       Cyndel Griffey E. Duanne Limerick, MD   General Surgery  Pager # [phone]

## 2021-05-15 DIAGNOSIS — Z79899 Other long term (current) drug therapy: Secondary | ICD-10-CM

## 2021-05-15 DIAGNOSIS — R627 Adult failure to thrive: Secondary | ICD-10-CM

## 2021-05-15 DIAGNOSIS — Z515 Encounter for palliative care: Secondary | ICD-10-CM

## 2021-05-15 DIAGNOSIS — I493 Ventricular premature depolarization: Secondary | ICD-10-CM

## 2021-05-15 DIAGNOSIS — E139 Other specified diabetes mellitus without complications: Secondary | ICD-10-CM

## 2021-05-15 DIAGNOSIS — R400 Somnolence: Secondary | ICD-10-CM

## 2021-05-15 DIAGNOSIS — R112 Nausea with vomiting, unspecified: Secondary | ICD-10-CM

## 2021-05-15 LAB — CBC
HCT: 25.8 % — ABNORMAL LOW (ref 38.9–52.0)
HGB: 7.9 g/dL — ABNORMAL LOW (ref 13.4–17.5)
MCH: 27.1 pg (ref 26.0–32.0)
MCHC: 30.6 g/dL — ABNORMAL LOW (ref 31.0–35.5)
MCV: 88.4 fL (ref 78.0–100.0)
MPV: 12.7 fL — ABNORMAL HIGH (ref 8.7–12.5)
PLATELETS: 510 10*3/uL — ABNORMAL HIGH (ref 150–400)
RBC: 2.92 10*6/uL — ABNORMAL LOW (ref 4.50–6.10)
RDW-CV: 17.6 % — ABNORMAL HIGH (ref 11.5–15.5)
WBC: 17.4 10*3/uL — ABNORMAL HIGH (ref 3.7–11.0)

## 2021-05-15 LAB — BASIC METABOLIC PANEL
ANION GAP: 7 mmol/L (ref 4–13)
BUN/CREA RATIO: 34 — ABNORMAL HIGH (ref 6–22)
BUN: 23 mg/dL (ref 8–25)
CALCIUM: 9.5 mg/dL (ref 8.8–10.2)
CHLORIDE: 108 mmol/L (ref 96–111)
CO2 TOTAL: 26 mmol/L (ref 23–31)
CREATININE: 0.68 mg/dL — ABNORMAL LOW (ref 0.75–1.35)
ESTIMATED GFR: >90 mL/min/BSA (ref >=60)
GLUCOSE: 168 mg/dL — ABNORMAL HIGH (ref 65–125)
POTASSIUM: 3.6 mmol/L (ref 3.5–5.1)
SODIUM: 141 mmol/L (ref 136–145)

## 2021-05-15 LAB — POC BLOOD GLUCOSE (RESULTS)
GLUCOSE, POC: 183 mg/dl — ABNORMAL HIGH (ref 70–105)
GLUCOSE, POC: 193 mg/dl — ABNORMAL HIGH (ref 70–105)
GLUCOSE, POC: 242 mg/dl — ABNORMAL HIGH (ref 70–105)
GLUCOSE, POC: 220 mg/dl — ABNORMAL HIGH (ref 70–105)
GLUCOSE, POC: 233 mg/dl — ABNORMAL HIGH (ref 70–105)

## 2021-05-15 LAB — ECG 12-LEAD
Atrial Rate: 104 {beats}/min
Calculated P Axis: 72 degrees
Calculated R Axis: 23 degrees
Calculated T Axis: 62 degrees
PR Interval: 150 ms
QRS Duration: 74 ms
QT Interval: 384 ms
QTC Calculation: 504 ms
Ventricular rate: 104 {beats}/min

## 2021-05-15 LAB — MAGNESIUM: MAGNESIUM: 2 mg/dL (ref 1.8–2.6)

## 2021-05-15 LAB — PTT (PARTIAL THROMBOPLASTIN TIME)
APTT: 63.1 seconds — ABNORMAL HIGH (ref 24.2–37.5)
APTT: 60.2 seconds — ABNORMAL HIGH (ref 24.2–37.5)

## 2021-05-15 LAB — PHOSPHORUS: PHOSPHORUS: 3.7 mg/dL (ref 2.3–4.0)

## 2021-05-15 MED ORDER — PANTOPRAZOLE 40 MG INTRAVENOUS SOLUTION
40.0000 mg | Freq: Every day | INTRAVENOUS | Status: DC
Start: 2021-05-15 — End: 2021-05-15
  Administered 2021-05-15: 40 mg via INTRAVENOUS
  Filled 2021-05-15: qty 10

## 2021-05-15 MED ORDER — DEXAMETHASONE SODIUM PHOSPHATE (PF) 10 MG/ML INJECTION SOLUTION
5.0000 mg | INTRAMUSCULAR | Status: AC
Start: 2021-05-15 — End: 2021-05-15
  Administered 2021-05-15: 5 mg via INTRAVENOUS
  Filled 2021-05-15: qty 1

## 2021-05-15 MED ORDER — ONDANSETRON HCL (PF) 4 MG/2 ML INJECTION SOLUTION
4.0000 mg | Freq: Three times a day (TID) | INTRAMUSCULAR | Status: DC
Start: 2021-05-15 — End: 2021-05-15

## 2021-05-15 MED ORDER — METOCLOPRAMIDE 5 MG/ML INJECTION SOLUTION
10.0000 mg | Freq: Four times a day (QID) | INTRAMUSCULAR | Status: DC
Start: 2021-05-15 — End: 2021-05-15
  Administered 2021-05-15: 10 mg via INTRAVENOUS

## 2021-05-15 MED ORDER — ONDANSETRON HCL (PF) 4 MG/2 ML INJECTION SOLUTION
4.0000 mg | Freq: Four times a day (QID) | INTRAMUSCULAR | Status: DC | PRN
Start: 2021-05-15 — End: 2021-05-17
  Administered 2021-05-15 – 2021-05-17 (×3): 4 mg via INTRAVENOUS
  Filled 2021-05-15 (×3): qty 2

## 2021-05-15 MED ORDER — INSULIN NPH ISOPHANE U-100 HUMAN 100 UNIT/ML SUBCUTANEOUS SUSPENSION
14.0000 [IU] | Freq: Two times a day (BID) | SUBCUTANEOUS | Status: DC
  Administered 2021-05-15 – 2021-05-16 (×3): 14 [IU] via SUBCUTANEOUS
  Filled 2021-05-15: qty 1000

## 2021-05-15 MED ORDER — PROMETHAZINE 12.5 MG TABLET
12.5000 mg | ORAL_TABLET | Freq: Four times a day (QID) | ORAL | Status: DC | PRN
Start: 2021-05-15 — End: 2021-05-16
  Administered 2021-05-15 (×2): 12.5 mg via ORAL
  Filled 2021-05-15 (×2): qty 1

## 2021-05-15 MED ORDER — WATER FOR INJECTION, STERILE INTRAVENOUS SOLUTION
INTRAVENOUS | Status: AC
Start: 2021-05-15 — End: 2021-05-16
  Filled 2021-05-15: qty 1000

## 2021-05-15 MED ORDER — INSULIN REGULAR HUMAN 100 UNIT/ML INJECTION - FOR TUBE FEED
12.0000 [IU] | INTRAMUSCULAR | Status: DC
Start: 2021-05-15 — End: 2021-05-17
  Administered 2021-05-15: 0 [IU] via SUBCUTANEOUS
  Administered 2021-05-16: 12 [IU] via SUBCUTANEOUS
  Administered 2021-05-16: 6 [IU] via SUBCUTANEOUS
  Administered 2021-05-16 – 2021-05-17 (×3): 12 [IU] via SUBCUTANEOUS
  Filled 2021-05-15: qty 0.12

## 2021-05-15 MED ORDER — INSULIN REGULAR HUMAN 100 UNIT/ML INJECTION - FOR TUBE FEED
10.0000 [IU] | INTRAMUSCULAR | Status: DC
Start: 2021-05-15 — End: 2021-05-15
  Filled 2021-05-15: qty 0.1

## 2021-05-15 NOTE — Nurses Notes (Signed)
Pt continues to have nausea and vomiting with alternating administration of Zofran and Compazine without relief. Kindly change Protonix PO to IVP and advice. Sent notification to on-call [facility] response.

## 2021-05-15 NOTE — Progress Notes (Signed)
[facility]  Surgical Oncology  Progress Note      Braxxton, Stoudt, 76 y.o. male  Date of Birth:  [birth year]  Date of Admission:  04/03/2021  Date of service: 05/15/2021    Assessment:  This is a 76 y.o. 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 He underwent massive transfusion protocol and ex lap on 04/03/21 at an outside facility with ligation of a bleeding vessel in the liver bed. Patient was subsequently transferred to [facility] postoperaively, intubated. Patient was subsequently extubated later that day on 3/4. 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 He failed advancing his diet to fulls the week of 4/08, backed off to TPN / clears after he developed severe nausea.    Plan/Recommendations:    Nausea:   QTc this morning 500   '5mg'$  dexamethasone once this AM   D/c Compazine and Reglan   D/c olanzapine   Keep PRN Zofran & PRN Phenergan PO   Will touch base with [facility] Pharm[facility] today  - Continue heparin gtt  - Diet: CLD  - Peripancreatic fluid collections   - Advanced GI performed EUS with cystogastrostomy 4/6              - Blood cultures 3/9: + pseudomonas    - Cultures form R JP: Pseudomonas and enterococcus              - ID consulted; Daptomycin and meropenum (completed 4/10)     - PE   - s/p IVC filter placement 3/17  - IVF: TPN 80 cc/hr, plan to cycle today  - Midline fistula with ostomy bag to monitor output  - OOB TID, encourage ambulation  -Pulmonary toilet, keep IS accessible at bedside  - PT/OT ordered, encourage pt to work with therapy  - Dispo: Floor      Subjective:   Nausea refractory to medications  Afebrile  Dry mouth, retching     Objective  Filed Vitals:     05/14/21 2056 05/14/21 2330 05/15/21 0332 05/15/21 0720   BP: (!) 168/90 (!) 141/81 (!) 150/83 (!) 151/89   Pulse: 95 99 (!) 101 (!) 102   Resp: '16 16 16 16   '$ Temp: 36.6 C (97.9 F) 36.4 C (97.5 F) 36.8 C (98.2 F) 36.6 C (97.9 F)   SpO2: 92% 94% 92% 91%     Physical Exam:   GEN:  AOx4, resting in bed, no acute distress  PULM: Normal respiratory effort. Equal/symmetric chest rise.  CV:  Pink, well perfused, RRR  ABD:   Abdomen soft, nontender, nondistended. Lower midline incision with thin tan drainage in Eakins pouch    MS: Atraumatic. Moves all extremities.  NEURO:   Alert and oriented to person, place and time.  Cranial nerves grossly intact.    Integumentary:  Pink, warm, and dry  PSYCHOSOCIAL: Pleasant.  Normal affect.     Labs  Results for orders placed or performed during the hospital encounter of 04/03/21 (from the past 24 hour(s))   PTT (PARTIAL THROMBOPLASTIN TIME) - ONCE   Result Value Ref Range    APTT 60.8 (H) 24.2 - 37.5 seconds    Narrative    Therapeutic range for  unfractionated heparin is 60-100 seconds.   PTT (PARTIAL THROMBOPLASTIN TIME) - ONCE   Result Value Ref Range    APTT 61.0 (H) 24.2 - 37.5 seconds    Narrative    Therapeutic range for unfractionated heparin is 60-100 seconds.   PTT (PARTIAL THROMBOPLASTIN TIME) - ONCE   Result Value Ref Range    APTT 63.1 (H) 24.2 - 37.5 seconds    Narrative    Therapeutic range for unfractionated heparin is 60-100 seconds.   PTT (PARTIAL THROMBOPLASTIN TIME) - ONCE   Result Value Ref Range    APTT 60.2 (H) 24.2 - 37.5 seconds    Narrative    Therapeutic range for unfractionated heparin is 60-100 seconds.   CBC   Result Value Ref Range    WBC 17.4 (H) 3.7 - 11.0 x10^3/uL    RBC 2.92 (L) 4.50 - 6.10 x10^6/uL    HGB 7.9 (L) 13.4 - 17.5 g/dL    HCT 25.8 (L) 38.9 - 52.0 %    MCV 88.4 78.0 - 100.0 fL    MCH 27.1 26.0 - 32.0 pg    MCHC 30.6 (L) 31.0 - 35.5 g/dL    RDW-CV 17.6 (H) 11.5 - 15.5 %    PLATELETS 510 (H) 150 - 400 x10^3/uL    MPV 12.7 (H) 8.7 -  12.5 fL    Narrative    Mean Corpuscular VolumeTest(s) repeated and results duplicated.   BASIC METABOLIC PANEL   Result Value Ref Range    SODIUM 141 136 - 145 mmol/L    POTASSIUM 3.6 3.5 - 5.1 mmol/L    CHLORIDE 108 96 - 111 mmol/L    CO2 TOTAL 26 23 - 31 mmol/L    ANION GAP 7 4 - 13 mmol/L    CALCIUM 9.5 8.8 - 10.2 mg/dL    GLUCOSE 168 (H) 65 - 125 mg/dL    BUN 23 8 - 25 mg/dL    CREATININE 0.68 (L) 0.75 - 1.35 mg/dL    BUN/CREA RATIO 34 (H) 6 - 22    ESTIMATED GFR >90 >=60 mL/min/BSA   MAGNESIUM   Result Value Ref Range    MAGNESIUM 2.0 1.8 - 2.6 mg/dL   PHOSPHORUS   Result Value Ref Range    PHOSPHORUS 3.7 2.3 - 4.0 mg/dL   POC BLOOD GLUCOSE (RESULTS)   Result Value Ref Range    GLUCOSE, POC 268 (H) 70 - 105 mg/dl   POC BLOOD GLUCOSE (RESULTS)   Result Value Ref Range    GLUCOSE, POC 189 (H) 70 - 105 mg/dl   POC BLOOD GLUCOSE (RESULTS)   Result Value Ref Range    GLUCOSE, POC 216 (H) 70 - 105 mg/dl   POC BLOOD GLUCOSE (RESULTS)   Result Value Ref Range    GLUCOSE, POC 185 (H) 70 - 105 mg/dl   POC BLOOD GLUCOSE (RESULTS)   Result Value Ref Range    GLUCOSE, POC 183 (H) 70 - 105 mg/dl   POC BLOOD GLUCOSE (RESULTS)   Result Value Ref Range    GLUCOSE, POC 193 (H) 70 - 105 mg/dl       I/O:  Date 05/14/21 0700 - 05/15/21 0659 05/15/21 0700 - 05/16/21 0659   Shift 0700-1459 1500-2259 2300-0659 24 Hour Total 0700-1459 1500-2259 2300-0659 24 Hour Total   INTAKE   I.V.(mL/kg/hr) 5621.30(8.65) 588(1.02)  7846.96(2.95)         Heparin Volume 103.74   103.74         Volume Infused (  LR premix infusion) 691.67   691.67         Volume Infused (adult custom parenteral nutrition) 484 588  1072       Shift Total(mL/kg) 5102.58(52.77) 588(8.16)  8242.35(36.1)       OUTPUT   Urine(mL/kg/hr) 1150(1.99) 850(1.47) 1125(1.95) 3125(1.81) 925   925     Urine (Voided) 1150 850 1125 3125 925   925     Urine Occurrence   1 x 1 x 2 x   2 x   Drains   0 0         Drain Output (Drain (Miscellaneous) peds ostomy bag Lower;Medial Abdomen)   0 0        Stool             Stool Occurrence 0 x  0 x 0 x       Shift Total(mL/kg) 4431(54.00) 867(61.95) 1125(15.6) 0932(67.12) 458(09.98)   925(12.83)   Weight (kg) 72.1 72.1 72.1 72.1 72.1 72.1 72.1 72.1       Radiology:  05/10/21 CT C/A/P   1.Postsurgical changes from distal pancreatectomy and splenectomy with persistent fluid collections along the surgical beds with two percutaneous drainage catheters noted. The fluid collection most closely approximating the pancreatic body shows marked interval decreased size status post stent placements, while the other collections similar to minimally decreased in size. No new organized intra-abdominal collections are identified.  2.Midline abdominal wall incision with underlying fluid and inflammatory changes are also similar to prior.   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 However, there is redemonstration of closely approximating transverse colon to the anterior abdominal wall.  4.Small left-sided pleural effusion with associated passive atelectasis.  5.Small volume left upper quadrant intra-abdominal free fluid and diffuse mesenteric stranding throughout.     Current Medications:  Current Facility-Administered Medications   Medication Dose Route Frequency   . acetaminophen (TYLENOL) tablet  500 mg Oral Q4H PRN   . adult custom variable rate parenteral nutrition   Intravenous Continuous   . adult custom variable rate parenteral nutrition   Intravenous Continuous   . atorvastatin (LIPITOR) tablet  40 mg Oral Daily with Breakfast   . benzonatate (TESSALON) capsule  100 mg Oral Q8H PRN   . D5W 250 mL flush bag   Intravenous Q15 Min PRN   . dexamethasone (PF) 10 mg/mL injection  5 mg Intravenous Now   . elvitegravir-cobicistat-emtricitrabine-tenofovir (GENVOYA) 150-150-200-10 mg per tablet  1 Tablet Oral Daily   . gabapentin (NEURONTIN) capsule  300  mg Oral 3x/day   . heparin 25,000 units in NS 250 mL infusion  18 Units/kg/hr (Adjusted) Intravenous Continuous   . insulin glargine-yfgn 100 units/mL injection  10 Units Subcutaneous NIGHTLY   . insulin NPH human 100 units/mL injection  10 Units Subcutaneous 2x/day   . insulin R human (HUMULIN R) 100 units/mL injection - for tube feed coverage  10 Units Subcutaneous 3 times per day   . LR premix infusion   Intravenous Continuous   . melatonin tablet  6 mg Oral NIGHTLY   . methocarbamol (ROBAXIN) tablet  500 mg Oral 4x/day   . metoclopramide (REGLAN) 5 mg/mL injection  10 mg Intravenous Q6HRS   . multivitamin-minerals-folic acid-lycopene-lutein (CERTAVITE SENIOR) tablet  1 Tablet Oral Q24H   . NS 250 mL flush bag   Intravenous Q15 Min PRN   . NS flush syringe  10-30 mL Intracatheter Q8HRS   .  NS flush syringe  20-30 mL Intracatheter Q1 MIN PRN   . NS flush syringe  2-6 mL Intracatheter Q8HRS   . NS flush syringe  2-6 mL Intracatheter Q1 MIN PRN   . OLANZapine (ZYPREXA) tablet  5 mg Oral NIGHTLY   . ondansetron (ZOFRAN) 2 mg/mL injection  4 mg Intravenous Q8H   . oxyCODONE (ROXICODONE) immediate release tablet  2.5 mg Oral Q4H PRN   . oxyCODONE (ROXICODONE) immediate release tablet  5 mg Oral Q4H PRN   . pantoprazole (PROTONIX) delayed release tablet  40 mg Oral Q12H   . prochlorperazine (COMPAZINE) 5 mg/mL injection  10 mg Intravenous Q6H PRN   . promethazine (PHENERGAN) tablet  12.5 mg Oral Q6H PRN   . sennosides-docusate sodium (SENOKOT-S) 8.6-'50mg'$  per tablet  1 Tablet Oral 2x/day   . SSIP insulin R human (HUMULIN R) 100 units/mL injection  0-12 Units Subcutaneous Q6H PRN       Mariea Clonts, MD  Surgery  [phone]

## 2021-05-15 NOTE — Nurses Notes (Signed)
Per thrombotic protocol, PTT of 60.2 is therapeutic. Hence, no change to heparin drip necessary. Heparin drip continued at 23 units/kg/hr. Four consecutive goal range values achieved. Next PTT to be drawn with morning labs on 4/16.

## 2021-05-15 NOTE — Nurses Notes (Signed)
Per thrombotic protocol, PTT of 63.1 is therapeutic. Hence, no change to heparin drip necessary. Heparin drip continued at 23 units/kg/hr. Next PTT scheduled for 0700.

## 2021-05-15 NOTE — Consults (Signed)
[facility]  Endocrinology Consult  Follow Up Note    [name], [name], 76 y.o. male  Date of Service: 05/15/2021  Date of Birth:  29-Oct-1945    Hospital Day:  LOS: 42 days     Reason for Consult: : Diabetes management     [name] is a 76 y.o. 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      Subjective:   Patient seen at bedside with his wife at bedside , he is lying on bed with TPN running . Says his nausea is little improved after getting the decadron 5 mg which he got around 11 still has poor appetite and has not eaten much .      Current Facility-Administered Medications   Medication Dose Route Frequency Provider Last Rate Last Admin    acetaminophen (TYLENOL) tablet  500 mg Oral Q4H PRN Donna Christen, MD   500 mg at 05/11/21 0841    adult custom variable rate parenteral nutrition   Intravenous Continuous Lucile Crater, MD 110 mL/hr at 05/15/21 0713 Solution Verified at 05/15/21 0713    atorvastatin (LIPITOR) tablet  40 mg Oral Daily with Breakfast Nicola Police, MD   40 mg at 05/13/21 0810    benzonatate (TESSALON) capsule  100 mg Oral Q8H PRN [name], MD        D5W 250 mL flush bag   Intravenous Q15 Min PRN Modena Jansky, DO        elvitegravir-cobicistat-emtricitrabine-tenofovir (GENVOYA) 150-150-200-10 mg per tablet  1 Tablet Oral Daily Monia Pouch, DO   1 Tablet at 05/14/21 1610    gabapentin (NEURONTIN) capsule  300 mg Oral 3x/day Mariea Clonts, MD   300 mg at 05/14/21 2102    heparin 25,000 units in NS 250 mL infusion  18 Units/kg/hr (Adjusted) Intravenous Continuous Lucile Crater, MD 17.1 mL/hr at 05/15/21 0713 23 Units/kg/hr at 05/15/21 0713    insulin glargine-yfgn 100 units/mL  injection  10 Units Subcutaneous NIGHTLY Shafiq, Ramsha, MD   10 Units at 05/14/21 2105    insulin NPH human 100 units/mL injection  10 Units Subcutaneous 2x/day Malena Peer, MD   10 Units at 05/14/21 2102    insulin R human (HUMULIN R) 100 units/mL injection - for tube feed coverage  10 Units Subcutaneous 3 times per day Malena Peer, MD   10 Units at 05/15/21 0521    LR premix infusion   Intravenous Continuous Lucile Crater, MD 25 mL/hr at 05/13/21 1020 Rate Change at 05/13/21 1020    melatonin tablet  6 mg Oral NIGHTLY Donna Christen, MD   6 mg at 05/14/21 2101    methocarbamol (ROBAXIN) tablet  500 mg Oral 4x/day Larna Daughters, MD   500 mg at 05/14/21 2102    metoclopramide (REGLAN) 5 mg/mL injection  10 mg Intravenous Frann Rider, MD   10 mg at 05/15/21 0815    multivitamin-minerals-folic acid-lycopene-lutein (CERTAVITE SENIOR) tablet  1 Tablet Oral Q24H Cosner, Adam, PA-C   1 Tablet at 05/12/21 1459    NS 250 mL flush bag   Intravenous Q15 Min PRN Modena Jansky, DO        NS flush syringe  10-30 mL Intracatheter Q8HRS Nicola Police, MD  20 mL at 05/15/21 0600    NS flush syringe  20-30 mL Intracatheter Q1 MIN PRN Nicola Police, MD        NS flush syringe  2-6 mL Intracatheter Q8HRS Modena Jansky, DO   5 mL at 05/15/21 0600    NS flush syringe  2-6 mL Intracatheter Q1 MIN PRN Modena Jansky, DO        OLANZapine [facility]) tablet  5 mg Oral NIGHTLY Cosner, Adam, PA-C   5 mg at 05/14/21 2112    ondansetron (ZOFRAN) 2 mg/mL injection  4 mg Intravenous Theodora Blow, MD        oxyCODONE (ROXICODONE) immediate release tablet  2.5 mg Oral Q4H PRN Stacey Drain, DO   2.5 mg at 05/10/21 1400    oxyCODONE (ROXICODONE) immediate release tablet  5 mg Oral Q4H PRN Stacey Drain, DO   5 mg at 05/14/21 1409    pantoprazole (PROTONIX) delayed release tablet  40 mg Oral Q12H [name], MD   40 mg at 05/14/21 1844    prochlorperazine (COMPAZINE) 5 mg/mL injection   10 mg Intravenous Q6H PRN Stacey Drain, DO   10 mg at 05/15/21 0817    promethazine (PHENERGAN) tablet  12.5 mg Oral Q6H PRN Mariea Clonts, MD   12.5 mg at 05/15/21 1829    sennosides-docusate sodium (SENOKOT-S) 8.6-'50mg'$  per tablet  1 Tablet Oral 2x/day Cosner, Adam, PA-C   1 Tablet at 04/26/21 2148    SSIP insulin R human (HUMULIN R) 100 units/mL injection  0-12 Units Subcutaneous Q6H PRN Malena Peer, MD   2 Units at 05/15/21 0035     Objective:  Temperature: 36.6 C (97.9 F)  Heart Rate: (!) 102  BP (Non-Invasive): (!) 151/89  Respiratory Rate: 16  SpO2: 91 %  General: Chronically ill appearing male in no acute distress.  HEENT: Head normocephalic, atraumatic. Conjunctiva clear. No proptosis or lid lag. Oral mucosa pink and moist.   Thyroid/Neck: No thyromegaly. Trachea midline.  Lungs: Normal respiratory effort.   Cardiac: Regular rate and rhythm.  Abdomen: Soft, non-tender, non-distended. Ostomy in place   Extremities: No edema, erythema, warmth, or tenderness.  Skin: Warm and dry. No visible rashes.  Neuro: sleepy ,no obvious focal neurologic deficit   Psychiatric: Normal mood and affect.      Lab Results   Component Value Date    WBC 17.4 (H) 05/15/2021    RBC 2.92 (L) 05/15/2021    HGB 7.9 (L) 05/15/2021    HCT 25.8 (L) 05/15/2021    MCV 88.4 05/15/2021    MCH 27.1 05/15/2021    MCHC 30.6 (L) 05/15/2021     Lab Results   Component Value Date    SODIUM 141 05/15/2021    POTASSIUM 3.6 05/15/2021    CO2 26 05/15/2021    BUN 23 05/15/2021    CREATININE 0.68 (L) 05/15/2021    GFR >90 05/15/2021    CALCIUM 9.5 05/15/2021    ALBUMIN 2.0 (L) 05/10/2021    ALKPHOS 116 (H) 05/10/2021    ALT 27 05/10/2021    AST 32 05/10/2021    ANIONGAP 7 05/15/2021     Lab Results   Component Value Date    CHOLESTEROL 212 (H) 12/21/2020    HDLCHOL 44 12/21/2020    LDLCHOL 96 12/21/2020    LDLCHOLDIR 89 03/24/2020    TRIG 153 (H) 05/14/2021      Lab Results   Component Value Date    HA1C 9.3 (  H) 03/17/2021         Recent Labs      05/13/21  1245 05/13/21  1814 05/13/21  2139 05/14/21  0000 05/14/21  0533 05/14/21  0932 05/14/21  1214 05/14/21  1815 05/14/21  2057 05/14/21  2341 05/15/21  0514 05/15/21  0902   GLUIP 196* 175* 178* 198* 199* 172* 268* 189* 216* 185* 183* 193*       Assessment/Recommendations:  [name] is a 76 y.o. 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 Endocrinology is consulted for management of diabetes.     Post Pancreatectomy Diabetes Mellitus  - Most recent HbA1c on 03/17/2021 was 9.3%.  - Home regimen: Lantus 11 units nightly, Novolog 4 units TID AC.   - Current inpatient regimen: Lantus 10 units nightly, Insulin NPH 10  units bid, Humulin R 10   units Q6 hours, scheduled with TPN  and Humulin R conservative  sliding scale 4x/day PRN.   -Diet: MNT PROTOCOL FOR DIETITIAN  DIET CLEAR LIQUID  adult custom variable rate parenteral nutrition   Currently on TPN to run for 18 hrs (8 pm to 2 pm next day )     He also got 5 mg of dexamethasone IV for his nausea . This would last in his system for at least 36 hrs     - Recommendations:  -to cover for anticipated hyperglycemia with the decadron will increase the dose of NPH to 14 units BID ( almost 8 additional units needed to cover the 5 mg of decadron which is about 33 mg  of prednisone )   -will increase the  insulin R to 12 units given with TPN at  8pm , 2am and 8 am  . Insulin R will last for at least 6-8 hr and would avoid any scheduled insulin between 2 pm-8 pm when TPN is off .  -continue with correctional scale regimen with insulin R every 6 hrs per conservative sliding scale     -continue with Lantus 10 units - this should not be held even if he off TPN/NPO    Thank you for the consult. Endocrine will  follow.      Mehjabeen CLEXN  Endocrine fellow    05/15/2021  Department of Endocrine, Diabetes and metabolism         05/15/2021  I saw and examined the patient.  I reviewed the fellow's note.  I agree with the findings and plan of care as documented in the fellow's note.  Any exceptions/additions are edited/noted.      Rose Phi, MD     Associate  Professor, Endocrinology.    [place]  [phone]

## 2021-05-15 NOTE — Consults (Signed)
[place]    Date of Service:  05/15/2021  Requesting MD: Dr. 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  Persistent nausea/vomiting minimally relieved from Zofran/Compazine.    Seen at bedside. Wife present. He was drowsy from earlier medications. Zyprexa seems to have helped him sleep - discontinued by primary due to prolonged QTc. Zofran helps some. Promethazine also helped his nausea some today, but still symptomatic. Nausea related to pills he feels - as soon as he places them in his mouth and has to swallow.    Recommend:  - Repeat EKG, our limit for some of the meds we are giving will be 500 or symptomatic. Most of the antipsychotics being used for nausea have been known to affect QTc, including promethazine - however most have not been shown to be clinically signficant. I am hesitant to attribute the QTc result shown to Zyprexa  - Add remeron 7.'5mg'$  qHS - this may help with sleep and nausea with different MOA to the other meds  -Protonix if not contraindicated by GI  -Continue Zofran '4mg'$  IV every 6 hours PRN    Thank you for the consult. We will continue to follow with you.    Decision Making Capacity:  Yes    Advance Directives prior to consultation:  MPOA- Yes, Name: Frazer [place] and Phone Number [phone];  Living Will- No;Healthcare Surrogate:  No    Did this encounter involve a discussion of hospice? No    Information Obtained from: patient, spouse and history reviewed via medical record     HPI/Discussion:   "76 y.o., White male who presents with abdominal pain.  This patient has a recent history of exploratory laparotomy and distal pancreatectomy and splenectomy for IPMN lesion at [facility].  Postoperatively he developed a small pulmonary embolism own was started on Eliquis.  He started to develop abdominal pain over the last 24-48 hours.  He sought medical attention in the emergency department and a CT scan was obtained that shows active contrast extravasation at the surgical bed.  General surgery was consulted stat for evaluation.  This patient is in obvious distress.  He is unable to answer questions currently.  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 His blood cultures on 3/9 were growing pseudomonas as well as cultures from his JP drain. The patient is on TPN and has continued to have nausea/vomiting throughout his stay. Supportive care consulted for symptom management.     Past Family, Medical, and Social History and ROS see H&P on 04/03/2021 by Dr. Stephanie Acre     Symptom Assessment:    Pain: Numeric 0  Other symptoms present: Nausea and Vomiting    Pt/Family unit dynamics: Patient was living at home with his wife prior to [place]:  Not assessed    Medical orders prior to consultation: Post - POST form not discussed DNR Card - No  Code Status:  Full  Treatment Limitations prior to consultation:  None        Exam:  Temperature: 36.6 C (97.9 F)  Heart Rate: (!) 105  BP (Non-Invasive): (!) 153/84  Respiratory Rate: 16  SpO2: 93 %    Constitutional:  appears chronically ill, acutely ill, appears stated age, mild distress and vital signs reviewed  Eyes:  Conjunctiva clear., Pupils equal and round.   ENT:  Mouth mucous membranes moist.   Neck:  supple, symmetrical, trachea midline  Respiratory:  Clear to auscultation bilaterally.   Cardiovascular:  regular rate and rhythm  Gastrointestinal:  soft, non-tender, positive bowel sounds  Musculoskeletal:  Head atraumatic and normocephalic and AROM  Integumentary:  Skin warm and dry  Neurologic:   Alert and oriented x3    Ventilator Settings: N/A  HFNC Settings:N/A  BIPAP Settings:N/A  Automatic Implantable Cardiac Defibrillator  No    Palliative Performance Scale-  50    Labs:    Lab Results Today:    Results for orders placed or performed during the hospital encounter of 04/03/21 (from the past 24 hour(s))   POC BLOOD GLUCOSE (RESULTS)   Result Value Ref Range    GLUCOSE, POC 189 (H) 70 - 105 mg/dl   PTT (PARTIAL THROMBOPLASTIN TIME) - ONCE   Result Value Ref Range    APTT 61.0 (H) 24.2 - 37.5 seconds   POC BLOOD GLUCOSE (RESULTS)   Result Value Ref Range    GLUCOSE, POC 216 (H) 70 - 105 mg/dl   POC BLOOD GLUCOSE (RESULTS)   Result Value Ref Range    GLUCOSE, POC 185 (H) 70 - 105 mg/dl   PTT (PARTIAL THROMBOPLASTIN TIME) - ONCE   Result Value Ref Range    APTT 63.1 (H) 24.2 - 37.5 seconds   POC BLOOD GLUCOSE (RESULTS)   Result Value Ref Range    GLUCOSE, POC 183 (H) 70 - 105 mg/dl   ECG 12-LEAD   Result Value Ref Range    Ventricular rate 104 BPM    Atrial Rate 104 BPM    PR Interval 150 ms    QRS Duration 74 ms    QT Interval 384 ms    QTC Calculation 504 ms    Calculated P Axis 72 degrees    Calculated R Axis 23 degrees    Calculated T Axis 62 degrees   PTT (PARTIAL THROMBOPLASTIN TIME) - ONCE   Result Value Ref Range    APTT 60.2 (H) 24.2 - 37.5 seconds   CBC   Result Value Ref Range    WBC 17.4 (H) 3.7 - 11.0 x10^3/uL    RBC 2.92 (L) 4.50 - 6.10 x10^6/uL    HGB 7.9 (L) 13.4 - 17.5 g/dL    HCT 25.8 (L) 38.9 - 52.0 %    MCV 88.4 78.0 - 100.0 fL    MCH 27.1 26.0 - 32.0 pg    MCHC 30.6 (L) 31.0 - 35.5 g/dL    RDW-CV 17.6 (H) 11.5 - 15.5 %    PLATELETS 510 (H) 150 - 400 x10^3/uL    MPV 12.7 (H) 8.7 - 12.5 fL   BASIC METABOLIC PANEL   Result Value Ref Range    SODIUM 141 136 - 145 mmol/L    POTASSIUM 3.6 3.5 - 5.1 mmol/L    CHLORIDE 108 96 - 111 mmol/L    CO2 TOTAL 26 23 - 31 mmol/L    ANION GAP 7 4 - 13 mmol/L    CALCIUM 9.5 8.8 - 10.2 mg/dL    GLUCOSE 168 (H) 65 - 125 mg/dL    BUN 23 8 - 25 mg/dL     CREATININE 0.68 (L) 0.75 - 1.35 mg/dL  BUN/CREA RATIO 34 (H) 6 - 22    ESTIMATED GFR >90 >=60 mL/min/BSA   MAGNESIUM   Result Value Ref Range    MAGNESIUM 2.0 1.8 - 2.6 mg/dL   PHOSPHORUS   Result Value Ref Range    PHOSPHORUS 3.7 2.3 - 4.0 mg/dL   POC BLOOD GLUCOSE (RESULTS)   Result Value Ref Range    GLUCOSE, POC 193 (H) 70 - 105 mg/dl   POC BLOOD GLUCOSE (RESULTS)   Result Value Ref Range    GLUCOSE, POC 242 (H) 70 - 105 mg/dl       Imaging Studies:  KUB from 05/13/2021 reviewed:  FINDINGS:  Scattered bowel gas is seen throughout the abdomen and pelvis in nondilated bowel loops. Gas is seen at the level of rectum. Mild stool burden. Small left pleural effusion and atelectasis is noted. An IVC filter is in place. Stents are noted projecting over the gastric body. Midline surgical staples are in place.    IMPRESSION:  1.Nonobstructed bowel gas pattern with mild stool burden.  2.Small left pleural effusion.    Consultant:  Pennie Banter, MD    On the day of the encounter, a total of  20 minutes was spent on this patient encounter including review of historical information, examination, documentation and post-visit activities. The time documented excludes procedural time.      Jamie Brookes, MD [facility] FACP

## 2021-05-16 DIAGNOSIS — Z794 Long term (current) use of insulin: Secondary | ICD-10-CM

## 2021-05-16 DIAGNOSIS — E891 Postprocedural hypoinsulinemia: Secondary | ICD-10-CM

## 2021-05-16 DIAGNOSIS — E1365 Other specified diabetes mellitus with hyperglycemia: Secondary | ICD-10-CM

## 2021-05-16 LAB — ADULT ROUTINE BLOOD CULTURE, SET OF 2 BOTTLES (BACTERIA AND YEAST)

## 2021-05-16 LAB — CBC
HCT: 24.2 % — ABNORMAL LOW (ref 38.9–52.0)
HGB: 7.4 g/dL — ABNORMAL LOW (ref 13.4–17.5)
MCH: 27.1 pg (ref 26.0–32.0)
MCHC: 30.6 g/dL — ABNORMAL LOW (ref 31.0–35.5)
MCV: 88.6 fL (ref 78.0–100.0)
MPV: 12.5 fL (ref 8.7–12.5)
PLATELETS: 537 10*3/uL — ABNORMAL HIGH (ref 150–400)
RBC: 2.73 10*6/uL — ABNORMAL LOW (ref 4.50–6.10)
RDW-CV: 17.7 % — ABNORMAL HIGH (ref 11.5–15.5)
WBC: 20.6 10*3/uL — ABNORMAL HIGH (ref 3.7–11.0)

## 2021-05-16 LAB — ECG 12-LEAD
Atrial Rate: 89 {beats}/min
Calculated P Axis: 53 degrees
Calculated R Axis: 28 degrees
Calculated T Axis: 61 degrees
PR Interval: 146 ms
QRS Duration: 76 ms
QT Interval: 386 ms
QTC Calculation: 469 ms
Ventricular rate: 89 {beats}/min

## 2021-05-16 LAB — MAGNESIUM: MAGNESIUM: 2.2 mg/dL (ref 1.8–2.6)

## 2021-05-16 LAB — BASIC METABOLIC PANEL
ANION GAP: 7 mmol/L (ref 4–13)
BUN/CREA RATIO: 40 — ABNORMAL HIGH (ref 6–22)
BUN: 35 mg/dL — ABNORMAL HIGH (ref 8–25)
CALCIUM: 9.7 mg/dL (ref 8.8–10.2)
CHLORIDE: 108 mmol/L (ref 96–111)
CO2 TOTAL: 25 mmol/L (ref 23–31)
CREATININE: 0.88 mg/dL (ref 0.75–1.35)
ESTIMATED GFR: 90 mL/min/BSA (ref >=60)
GLUCOSE: 204 mg/dL — ABNORMAL HIGH (ref 65–125)
POTASSIUM: 4 mmol/L (ref 3.5–5.1)
SODIUM: 140 mmol/L (ref 136–145)

## 2021-05-16 LAB — POC BLOOD GLUCOSE (RESULTS)
GLUCOSE, POC: 261 mg/dl — ABNORMAL HIGH (ref 70–105)
GLUCOSE, POC: 381 mg/dl — ABNORMAL HIGH (ref 70–105)
GLUCOSE, POC: 379 mg/dl — ABNORMAL HIGH (ref 70–105)
GLUCOSE, POC: 234 mg/dl — ABNORMAL HIGH (ref 70–105)
GLUCOSE, POC: 191 mg/dl — ABNORMAL HIGH (ref 70–105)

## 2021-05-16 LAB — CREATINE KINASE (CK), TOTAL, SERUM: CREATINE KINASE: <7 U/L — ABNORMAL LOW (ref 45–225)

## 2021-05-16 LAB — PTT (PARTIAL THROMBOPLASTIN TIME): APTT: 99.6 seconds — ABNORMAL HIGH (ref 24.2–37.5)

## 2021-05-16 LAB — PHOSPHORUS: PHOSPHORUS: 4.1 mg/dL — ABNORMAL HIGH (ref 2.3–4.0)

## 2021-05-16 MED ORDER — INSULIN NPH ISOPHANE U-100 HUMAN 100 UNIT/ML SUBCUTANEOUS SUSPENSION
30.0000 [IU] | Freq: Every evening | SUBCUTANEOUS | Status: DC
  Filled 2021-05-16: qty 1000

## 2021-05-16 MED ORDER — SODIUM CHLORIDE 0.9 % INTRAVENOUS SOLUTION
1.0000 g | Freq: Three times a day (TID) | INTRAVENOUS | Status: DC
Start: 2021-05-16 — End: 2021-05-18
  Administered 2021-05-16 (×2): 1 g via INTRAVENOUS
  Administered 2021-05-16: 0 g via INTRAVENOUS
  Administered 2021-05-17: 1 g via INTRAVENOUS
  Administered 2021-05-17 (×3): 0 g via INTRAVENOUS
  Administered 2021-05-17 (×2): 1 g via INTRAVENOUS
  Administered 2021-05-17: 0 g via INTRAVENOUS
  Administered 2021-05-18: 1 g via INTRAVENOUS
  Administered 2021-05-18: 0 g via INTRAVENOUS
  Filled 2021-05-16 (×7): qty 20

## 2021-05-16 MED ORDER — SODIUM CHLORIDE 0.9 % INTRAVENOUS SOLUTION
6.0000 mg/kg | INTRAVENOUS | Status: DC
Start: 2021-05-16 — End: 2021-05-17
  Administered 2021-05-16: 450 mg via INTRAVENOUS
  Administered 2021-05-16: 0 mg via INTRAVENOUS
  Administered 2021-05-17: 450 mg via INTRAVENOUS
  Administered 2021-05-17: 0 mg via INTRAVENOUS
  Filled 2021-05-16 (×2): qty 9

## 2021-05-16 MED ORDER — SODIUM CHLORIDE 0.9 % INTRAVENOUS SOLUTION
1.0000 g | INTRAVENOUS | Status: AC
Start: 2021-05-16 — End: 2021-05-16
  Administered 2021-05-16: 1 g via INTRAVENOUS
  Administered 2021-05-16: 0 g via INTRAVENOUS
  Filled 2021-05-16: qty 20

## 2021-05-16 MED ORDER — WATER FOR INJECTION, STERILE INTRAVENOUS SOLUTION
INTRAVENOUS | Status: AC
Start: 2021-05-16 — End: 2021-05-17
  Filled 2021-05-16: qty 1000

## 2021-05-16 MED ORDER — INSULIN NPH ISOPHANE U-100 HUMAN 100 UNIT/ML SUBCUTANEOUS SUSPENSION: 6.0000 [IU] | SUBCUTANEOUS | Status: DC

## 2021-05-16 MED ORDER — INSULIN NPH ISOPHANE U-100 HUMAN 100 UNIT/ML SUBCUTANEOUS SUSPENSION: 15.0000 [IU] | Freq: Every morning | SUBCUTANEOUS | Status: DC

## 2021-05-16 MED ORDER — PROMETHAZINE 12.5 MG TABLET
6.2500 mg | ORAL_TABLET | Freq: Four times a day (QID) | ORAL | Status: DC | PRN
Start: 2021-05-16 — End: 2021-05-28
  Administered 2021-05-16 – 2021-05-28 (×7): 6.25 mg via ORAL
  Filled 2021-05-16 (×8): qty 1

## 2021-05-16 MED ORDER — INSULIN NPH ISOPHANE U-100 HUMAN 100 UNIT/ML SUBCUTANEOUS SUSPENSION
20.0000 [IU] | Freq: Every morning | SUBCUTANEOUS | Status: DC
  Administered 2021-05-17: 20 [IU] via SUBCUTANEOUS
  Administered 2021-05-18: 0 [IU] via SUBCUTANEOUS
  Filled 2021-05-16: qty 1000

## 2021-05-16 MED ORDER — INSULIN NPH ISOPHANE U-100 HUMAN 100 UNIT/ML SUBCUTANEOUS SUSPENSION
30.0000 [IU] | Freq: Every evening | SUBCUTANEOUS | Status: DC
  Administered 2021-05-16 – 2021-05-17 (×2): 30 [IU] via SUBCUTANEOUS
  Filled 2021-05-16: qty 1000

## 2021-05-16 NOTE — Progress Notes (Signed)
[facility]  Surgical Oncology  Progress Note      [name], [name], 76 y.o. male  Date of Birth:  February 05, 1945  Date of Admission:  04/03/2021  Date of service: 05/16/2021    Assessment:  This is a 76 y.o. 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 He underwent massive transfusion protocol and ex lap on 04/03/21 at an outside facility with ligation of a bleeding vessel in the liver bed. Patient was subsequently transferred to [facility] postoperaively, intubated. Patient was subsequently extubated later that day on 3/4. 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 He failed advancing his diet to fulls the week of 4/08, backed off to TPN / clears after he developed severe nausea.    Plan/Recommendations:    - Positive blood culture    - One of two bottles positive for GPCs, plan to repeat tomorrow   - Continue antibiotics at this time, deescalate when able  - Nausea:   QTc this morning 469   Keep PRN Zofran & PRN Phenergan PO  - Continue heparin gtt  - Diet: CLD  - Peripancreatic fluid collections   - Advanced GI performed EUS with cystogastrostomy 4/6              - Blood cultures 3/9: + pseudomonas    - Cultures form R JP: Pseudomonas and enterococcus              - ID consulted; Daptomycin and meropenum (completed 4/10)     - PE   - s/p IVC filter placement 3/17  - IVF: TPN 80 cc/hr, continue  - Midline fistula with ostomy bag to monitor output  - OOB TID, encourage ambulation  -Pulmonary toilet, keep IS accessible at bedside  - PT/OT ordered, encourage pt to work with therapy  - Dispo: Floor      Subjective:   Reports phenergan helped with nausea. Repeat EKG today with QTc 469. 1/2 BCX positive for  GPCs, dapto and merrem restarted.      Objective  Filed Vitals:    05/15/21 2106 05/15/21 2350 05/16/21 0503 05/16/21 0737   BP:  132/83 138/86 138/84   Pulse:  96 92 75   Resp: '18 18 18 18   '$ Temp:  36.3 C (97.3 F) 36.5 C (97.7 F) 36.6 C (97.9 F)   SpO2:  91% 91% 90%     Physical Exam:   GEN:  AOx4, resting in bed, no acute distress  PULM: Normal respiratory effort. Equal/symmetric chest rise.  CV:  Pink, well perfused, RRR  ABD:   Abdomen soft, nontender, nondistended. Lower midline incision with thin tan drainage in Eakins pouch    MS: Atraumatic. Moves all extremities.  NEURO:   Alert and oriented to person, place and time.  Cranial nerves grossly intact.    Integumentary:  Pink, warm, and dry  PSYCHOSOCIAL: Pleasant.  Normal affect.     Labs  Results for orders placed or performed during the hospital encounter of 04/03/21 (from the past 24 hour(s))   PTT (PARTIAL THROMBOPLASTIN TIME) - AM ONCE   Result Value Ref Range    APTT 99.6 (H) 24.2 - 37.5 seconds    Narrative  Therapeutic range for unfractionated heparin is 60-100 seconds.   CREATINE KINASE (CK), TOTAL, SERUM - TODAY & WEEKLY   Result Value Ref Range    CREATINE KINASE <7 (L) 45 - 225 U/L   BASIC METABOLIC PANEL   Result Value Ref Range    SODIUM 140 136 - 145 mmol/L    POTASSIUM 4.0 3.5 - 5.1 mmol/L    CHLORIDE 108 96 - 111 mmol/L    CO2 TOTAL 25 23 - 31 mmol/L    ANION GAP 7 4 - 13 mmol/L    CALCIUM 9.7 8.8 - 10.2 mg/dL    GLUCOSE 204 (H) 65 - 125 mg/dL    BUN 35 (H) 8 - 25 mg/dL    CREATININE 0.88 0.75 - 1.35 mg/dL    BUN/CREA RATIO 40 (H) 6 - 22    ESTIMATED GFR 90 >=60 mL/min/BSA   CBC   Result Value Ref Range    WBC 20.6 (H) 3.7 - 11.0 x10^3/uL    RBC 2.73 (L) 4.50 - 6.10 x10^6/uL    HGB 7.4 (L) 13.4 - 17.5 g/dL    HCT 24.2 (L) 38.9 - 52.0 %    MCV 88.6 78.0 - 100.0 fL    MCH 27.1 26.0 - 32.0 pg    MCHC 30.6 (L) 31.0 - 35.5 g/dL    RDW-CV 17.7 (H) 11.5 - 15.5 %    PLATELETS 537 (H) 150 - 400 x10^3/uL    MPV 12.5 8.7 - 12.5 fL   PHOSPHORUS    Result Value Ref Range    PHOSPHORUS 4.1 (H) 2.3 - 4.0 mg/dL   MAGNESIUM   Result Value Ref Range    MAGNESIUM 2.2 1.8 - 2.6 mg/dL   POC BLOOD GLUCOSE (RESULTS)   Result Value Ref Range    GLUCOSE, POC 193 (H) 70 - 105 mg/dl   POC BLOOD GLUCOSE (RESULTS)   Result Value Ref Range    GLUCOSE, POC 242 (H) 70 - 105 mg/dl   POC BLOOD GLUCOSE (RESULTS)   Result Value Ref Range    GLUCOSE, POC 220 (H) 70 - 105 mg/dl   POC BLOOD GLUCOSE (RESULTS)   Result Value Ref Range    GLUCOSE, POC 233 (H) 70 - 105 mg/dl   POC BLOOD GLUCOSE (RESULTS)   Result Value Ref Range    GLUCOSE, POC 261 (H) 70 - 105 mg/dl       I/O:  Date 05/15/21 0700 - 05/16/21 0659 05/16/21 0700 - 05/17/21 0659   Shift 0700-1459 1500-2259 2300-0659 24 Hour Total 0700-1459 1500-2259 2300-0659 24 Hour Total   INTAKE   P.O.   358 358 110   110     Oral   358 358 110   110   I.V.(mL/kg/hr)  4481(8.56)  3149(7.02)         Heparin Volume  513  513         Volume Infused (LR premix infusion)  750  750         Volume Infused (adult custom variable rate parenteral nutrition)  1870  1870       IV Piggyback   179 179         Volume (meropenem (MERREM) 1 g in NS 100 mL IVPB)   120 120         Volume (DAPTOmycin (CUBICIN) 450 mg in NS 50 mL IVPB)   59 59       Shift Total(mL/kg)  6378(58.85) 537(7.45) 3670(50.9) 110(1.53)   110(1.53)  OUTPUT   Urine(mL/kg/hr) 925(1.6) 275(0.48) 575(1) 1775(1.03) 175   175     Urine (Voided) [phone] 175   175     Urine Occurrence 2 x   2 x       Drains 50 50 5 105         Drain Output (Drain (Miscellaneous) peds ostomy bag Lower;Medial Abdomen) 50 50 5 105       Stool             Stool Occurrence  0 x 0 x 0 x       Shift Total(mL/kg) 975(13.52) 325(4.51) 580(8.04) 1880(26.08) 175(2.43)   175(2.43)   Weight (kg) 72.1 72.1 72.1 72.1 72.1 72.1 72.1 72.1       Radiology:  05/10/21 CT C/A/P   1.Postsurgical changes from distal pancreatectomy and splenectomy with persistent fluid collections along the surgical beds with two  percutaneous drainage catheters noted. The fluid collection most closely approximating the pancreatic body shows marked interval decreased size status post stent placements, while the other collections similar to minimally decreased in size. No new organized intra-abdominal collections are identified.  2.Midline abdominal wall incision with underlying fluid and inflammatory changes are also similar to prior.   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 However, there is redemonstration of closely approximating transverse colon to the anterior abdominal wall.  4.Small left-sided pleural effusion with associated passive atelectasis.  5.Small volume left upper quadrant intra-abdominal free fluid and diffuse mesenteric stranding throughout.     Current Medications:  Current Facility-Administered Medications   Medication Dose Route Frequency    acetaminophen (TYLENOL) tablet  500 mg Oral Q4H PRN    adult custom variable rate parenteral nutrition   Intravenous Continuous    atorvastatin (LIPITOR) tablet  40 mg Oral Daily with Breakfast    benzonatate (TESSALON) capsule  100 mg Oral Q8H PRN    D5W 250 mL flush bag   Intravenous Q15 Min PRN    DAPTOmycin (CUBICIN) 450 mg in NS 50 mL IVPB  6 mg/kg Intravenous Q24H    elvitegravir-cobicistat-emtricitrabine-tenofovir (GENVOYA) 150-150-200-10 mg per tablet  1 Tablet Oral Daily    gabapentin (NEURONTIN) capsule  300 mg Oral 3x/day    heparin 25,000 units in NS 250 mL infusion  18 Units/kg/hr (Adjusted) Intravenous Continuous    insulin glargine-yfgn 100 units/mL injection  10 Units Subcutaneous NIGHTLY    insulin NPH human 100 units/mL injection  14 Units Subcutaneous 2x/day    insulin R human (HUMULIN R) 100 units/mL injection - for tube feed coverage  12 Units Subcutaneous 3 times per day    LR premix infusion   Intravenous Continuous    melatonin  tablet  6 mg Oral NIGHTLY    meropenem (MERREM) 1 g in NS 100 mL IVPB  1 g Intravenous Q8H    methocarbamol (ROBAXIN) tablet  500 mg Oral 4x/day    multivitamin-minerals-folic acid-lycopene-lutein (CERTAVITE SENIOR) tablet  1 Tablet Oral Q24H    NS 250 mL flush bag   Intravenous Q15 Min PRN    NS flush syringe  10-30 mL Intracatheter Q8HRS    NS flush syringe  20-30 mL Intracatheter Q1 MIN PRN    NS flush syringe  2-6 mL Intracatheter Q8HRS    NS flush syringe  2-6 mL Intracatheter Q1 MIN PRN    ondansetron (ZOFRAN) 2 mg/mL injection  4 mg Intravenous Q6H  PRN    oxyCODONE (ROXICODONE) immediate release tablet  2.5 mg Oral Q4H PRN    oxyCODONE (ROXICODONE) immediate release tablet  5 mg Oral Q4H PRN    pantoprazole (PROTONIX) delayed release tablet  40 mg Oral Q12H    promethazine (PHENERGAN) tablet  12.5 mg Oral Q6H PRN    sennosides-docusate sodium (SENOKOT-S) 8.6-'50mg'$  per tablet  1 Tablet Oral 2x/day    SSIP insulin R human (HUMULIN R) 100 units/mL injection  0-12 Units Subcutaneous Q6H PRN       Paul E. Duanne Limerick, MD   General Surgery  Pager # [phone]    I saw and examined this patient with the resident above.  Please see the resident's note, which I have carefully reviewed, for full details. I agree with the findings and plan of care as documented in the resident's note.  Any additions/exceptions are edited/noted.    Feels much better today, less nausea, tolerating pills.  Asking for full liquids.  Exam unchanged.  Plan to continue current nausea regimen, as it appears to be working today.  WBC up to 20, which could be secondary to the Decadron yesterday.  No fevers, no signs for infection.  Continue to monitor.    Illene Silver, MD 05/16/2021 10:03  Associate Professor of Surgery  Division of [place] Medicine

## 2021-05-16 NOTE — Consults (Signed)
[facility]  Endocrinology Consult  Follow Up Note    [name], [name], 76 y.o. male  Date of Service: 05/16/2021  Date of Birth:  1945/10/20    Hospital Day:  LOS: 43 days     Reason for Consult: : Diabetes management     COBEY RAINERI is a 75 y.o. 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      Subjective:   Patient seen at bedside with his wife at bedside , he is lying on bed with TPN running . Feels nausea is better today and started in Full liquid diet. Has been drinking apple juice       Current Facility-Administered Medications   Medication Dose Route Frequency Provider Last Rate Last Admin   . acetaminophen (TYLENOL) tablet  500 mg Oral Q4H PRN Donna Christen, MD   500 mg at 05/11/21 0841   . adult custom variable rate parenteral nutrition   Intravenous Continuous Mariea Clonts, MD 55 mL/hr at 05/16/21 1352 Rate Change at 05/16/21 1352   . adult custom variable rate parenteral nutrition   Intravenous Continuous Breinholt, Connor, MD       . atorvastatin (LIPITOR) tablet  40 mg Oral Daily with Breakfast Nicola Police, MD   40 mg at 05/16/21 0916   . benzonatate (TESSALON) capsule  100 mg Oral Q8H PRN [name], MD       . D5W 250 mL flush bag   Intravenous Q15 Min PRN Modena Jansky, DO       . DAPTOmycin (CUBICIN) 450 mg in NS 50 mL IVPB  6 mg/kg Intravenous Q24H Nobie Putnam, MD   Stopped at 05/16/21 [phone]   . elvitegravir-cobicistat-emtricitrabine-tenofovir (GENVOYA) 150-150-200-10 mg per tablet  1 Tablet Oral Daily Monia Pouch, DO   1 Tablet at 05/16/21 [phone]   . gabapentin (NEURONTIN) capsule  300 mg Oral 3x/day Mariea Clonts, MD   300 mg at 05/16/21 1355   . heparin 25,000 units in NS 250  mL infusion  18 Units/kg/hr (Adjusted) Intravenous Continuous Lucile Crater, MD 17.1 mL/hr at 05/16/21 1352 23 Units/kg/hr at 05/16/21 1352   . [START ON 05/17/2021] insulin NPH human 100 units/mL injection  30 Units Subcutaneous Daily after Concha Norway, Zipporah Plants, MD       . Derrill Memo ON 05/17/2021] insulin NPH human 100 units/mL injection  20 Units Subcutaneous Daily before Breakfast Rose Phi, MD       . insulin NPH human 100 units/mL injection  6 Units Subcutaneous Now Rose Phi, MD       . insulin R human (HUMULIN R) 100 units/mL injection - for tube feed coverage  12 Units Subcutaneous 3 times per day Malena Peer, MD   12 Units at 05/16/21 0919   . melatonin tablet  6 mg Oral NIGHTLY Donna Christen, MD   6 mg at 05/15/21 2006   . meropenem (MERREM) 1 g in NS 100 mL IVPB  1 g Intravenous Jen Mow, MD 40 mL/hr at 05/16/21 1355 1 g at 05/16/21 1355   . multivitamin-minerals-folic acid-lycopene-lutein (CERTAVITE SENIOR) tablet  1 Tablet Oral Q24H Cosner, Adam, PA-C   1 Tablet at 05/16/21 1355   . NS 250 mL flush bag   Intravenous Q15  Min PRN Modena Jansky, DO       . NS flush syringe  10-30 mL Intracatheter Q8HRS Nicola Police, MD   10 mL at 05/16/21 0600   . NS flush syringe  20-30 mL Intracatheter Q1 MIN PRN Nicola Police, MD       . NS flush syringe  2-6 mL Intracatheter Q8HRS Modena Jansky, DO   6 mL at 05/16/21 0600   . NS flush syringe  2-6 mL Intracatheter Q1 MIN PRN Modena Jansky, DO       . ondansetron [facility]) 2 mg/mL injection  4 mg Intravenous Q6H PRN Mariea Clonts, MD   4 mg at 05/16/21 1225   . oxyCODONE (ROXICODONE) immediate release tablet  2.5 mg Oral Q4H PRN Sowers, Briana, DO   2.5 mg at 05/10/21 1400   . oxyCODONE (ROXICODONE) immediate release tablet  5 mg Oral Q4H PRN Stacey Drain, DO   5 mg at 05/15/21 2006   . pantoprazole (PROTONIX) delayed release tablet  40 mg Oral Q12H [name], MD   40 mg at 05/16/21 0452   . promethazine  (PHENERGAN) tablet  6.25 mg Oral Q6H PRN Lucile Crater, MD       . sennosides-docusate sodium (SENOKOT-S) 8.6-'50mg'$  per tablet  1 Tablet Oral 2x/day Cosner, Adam, PA-C   1 Tablet at 05/15/21 2006   . SSIP insulin R human (HUMULIN R) 100 units/mL injection  0-12 Units Subcutaneous Q6H PRN Malena Peer, MD   6 Units at 05/16/21 1225     Objective:  Temperature: 36.5 C (97.7 F)  Heart Rate: 95  BP (Non-Invasive): 138/83  Respiratory Rate: 18  SpO2: 92 %     General: Chronically ill appearing male in no acute distress.  HEENT: Head normocephalic, atraumatic. Conjunctiva clear. No proptosis or lid lag. Oral mucosa pink and moist.   Thyroid/Neck: No thyromegaly. Trachea midline.  Lungs: Normal respiratory effort.   Cardiac: Regular rate and rhythm.  Abdomen: Soft, non-tender, non-distended. Ostomy in place   Extremities: No edema, erythema, warmth, or tenderness.  Skin: Warm and dry. No visible rashes.  Neuro: sleepy ,no obvious focal neurologic deficit   Psychiatric: Normal mood and affect.      Lab Results   Component Value Date    WBC 20.6 (H) 05/16/2021    RBC 2.73 (L) 05/16/2021    HGB 7.4 (L) 05/16/2021    HCT 24.2 (L) 05/16/2021    MCV 88.6 05/16/2021    MCH 27.1 05/16/2021    MCHC 30.6 (L) 05/16/2021     Lab Results   Component Value Date    SODIUM 140 05/16/2021    POTASSIUM 4.0 05/16/2021    CO2 25 05/16/2021    BUN 35 (H) 05/16/2021    CREATININE 0.88 05/16/2021    GFR 90 05/16/2021    CALCIUM 9.7 05/16/2021    ALBUMIN 2.0 (L) 05/10/2021    ALKPHOS 116 (H) 05/10/2021    ALT 27 05/10/2021    AST 32 05/10/2021    ANIONGAP 7 05/16/2021     Lab Results   Component Value Date    CHOLESTEROL 212 (H) 12/21/2020    HDLCHOL 44 12/21/2020    LDLCHOL 96 12/21/2020    LDLCHOLDIR 89 03/24/2020    TRIG 153 (H) 05/14/2021      Lab Results   Component Value Date    HA1C 9.3 (H) 03/17/2021         Recent Labs  05/14/21  1815 05/14/21  2057 05/14/21  2341 05/15/21  0514 05/15/21  0902 05/15/21  1110 05/15/21  1810  05/15/21  2004 05/16/21  0202 05/16/21  0842 05/16/21  1212   GLUIP 189* 216* 185* 183* 193* 242* 220* 233* 261* 381* 379*       Assessment/Recommendations:  RAPHEL STICKLES is a 76 y.o. 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 Endocrinology is consulted for management of diabetes.     Post Pancreatectomy Diabetes Mellitus  - Most recent HbA1c on 03/17/2021 was 9.3%.  - Home regimen: Lantus 11 units nightly, Novolog 4 units TID AC.   - Current inpatient regimen: Lantus 10 units nightly, Insulin NPH 10  units bid, Humulin R 10   units Q6 hours, scheduled with TPN  and Humulin R conservative  sliding scale 4x/day PRN.   -Diet: MNT PROTOCOL FOR DIETITIAN  adult custom variable rate parenteral nutrition  DIET FULL LIQUID  adult custom variable rate parenteral nutrition   Currently on TPN to run for 18 hrs (8 pm to 2 pm next day )     He also got 5 mg of dexamethasone IV for his nausea . This would last in his system for at least 36 hrs     - Recommendations:    Hyperglycemia likely from stress response, TPN ( incretin effect) , stress from illness and history of pancreatectomy. Patient is now on full liquid diet which is causing Hyperglycemia. Only dose of decadron was yesterday none today. ( 05/16/2021)   Recommend NPH 30 units at night. NPH peak will likely help hyperglycemia at the onset of TPN.   NPH 20 units in the morning.   Stop Glargine Insulin   Continue R to 12 units given with TPN at  8pm , 2am and 8 am  . Insulin R will last for at least 6-8 hr and would avoid any scheduled insulin between 2 pm-8 pm when TPN is off .    Scheduling total daily Insulin of 86 units daily. ( Plus moderate R insulin coverage)     Will access Insulin requirements and make changes.      Rose Phi, MD     Associate  Professor, Endocrinology.    [place]  [phone]

## 2021-05-16 NOTE — Nurses Notes (Signed)
Request sent for 2 infusion pumps for pt so that he can receive his antibiotics.

## 2021-05-16 NOTE — Care Plan (Signed)
[facility]  Physical Therapy Progress Note      Patient Name: [name]  Date of Birth: 09-Oct-1945  Height:  175.3 cm (5' 9.02")  Weight:  72.1 kg (158 lb 15.2 oz)  Room/Bed: 961/A  Payor: [employer] / Plan: [place] [employer] / Product Type: Managed Care /     Assessment:     Mr. Hommel was alert, distressed, and requesting to get back to bed. He had improvement with sit to stand but overall limited tol to gt/activity due to nausea. He continues to demonstrate generalized weakness, decreased activity tol, impaired balance, and impaired mobility. His mobility is not currently adequate for safe d/c to home but he has good rehab potential. Rec IRF at d/c.    Discharge Needs:   Equipment Recommendation: TBD  Discharge Disposition: inpatient rehabilitation facility    JUSTIFICATION OF DISCHARGE RECOMMENDATION   Based on current diagnosis, functional performance prior to admission, and current functional performance, this patient requires continued PT services in inpatient rehabilitation facility in order to achieve significant functional improvements in these deficit areas: aerobic capacity/endurance, gait, locomotion, and balance, muscle performance.      Plan:   Continue to follow patient according to established plan of care.  The risks/benefits of therapy have been discussed with the patient/caregiver and he/she is in agreement with the established plan of care.     Subjective & Objective:        05/14/21 1141   Therapist Pager   PT Assigned/ Pager # [name] [phone]   Rehab Session   Document Type therapy progress note (daily note)   Total PT Minutes: 8   Patient Effort adequate   Symptoms Noted During/After Treatment fatigue;nausea   General Information   Patient Profile Reviewed yes   Patient/Family/Caregiver Comments/Observations Pt was alert and mildly distressed in chair. Feels nauseated and wants to return to bed. Declined activity other than back to  bed. Wife at b/s and supportive.   Medical Lines PICC Line;TPN  (ostomy appliance for collection of fistual)   Respiratory Status room air   Existing Precautions/Restrictions contact isolation;fall precautions;full code   General Observations of Patient Pt was seen at b/s on 9e for assistance back to bed.   Mutuality/Individual Preferences   Individualized Care Needs OOB with FWW and assist x 1   Pre Treatment Status   Pre Treatment Patient Status Patient sitting in bedside chair or w/c;Call light within reach;Telephone within reach   Support Present Pre Treatment  Family present   Cognitive Assessment/Interventions   Behavior/Mood Observations alert;cooperative  (distressed)   Attention WNL/WFL   Follows Commands WNL   Pain Assessment   Pretreatment Pain Rating 0/10 - no pain   Posttreatment Pain Rating 0/10 - no pain   Pre/Posttreatment Pain Comment nauseous but no pain   Mobility Assessment/Training   Comment Pt was seen for transfer bed to chair. He was resting in relative comfort in bed at end of tx.   Bed Mobility Assessment/Treatment   Bed Mobility, Assistive Device bed rails;Head of Bed Elevated   Sit to Supine, Independence moderate assist (50% patient effort)   Safety Issues decreased use of legs for bridging/pushing;decreased use of arms for pushing/pulling;impaired trunk control for bed mobility   Impairments balance impaired;strength decreased   Comment Pt needed mod A to lift his legs back into bed.   Transfer Assessment/Treatment   Sit-Stand Independence minimum assist (75% patient effort)   Stand-Sit Independence minimum assist (75%  patient effort)   Sit-Stand-Sit, Assist Device walker, front wheeled   Chair-Bed Independence contact guard assist   Bed-Chair-Bed Assist Device walker, front wheeled   Transfer Safety Issues weight-shifting ability decreased;step length decreased;balance decreased during turns   Transfer Impairments balance impaired;endurance;postural control impaired;strength decreased    Transfer Comment Pt was able to push from arm rest of chair to stand with min A x 1   Gait Assessment/Treatment   Independence  contact guard assist   Assistive Device  walker, front wheeled   Distance in Feet 2 ft chair to bed   Gait Speed slow   Deviations  cadence decreased;double stance time increased;limb motion velocity decreased;step length decreased;stride length decreased;swing-to-stance ratio decreased;toe-to-floor clearance decreased;weight-shifting ability decreased   Safety Issues  step length decreased;weight-shifting ability decreased;balance decreased during turns   Impairments  balance impaired;endurance;strength decreased   Comment Pt ambulated just 2 ft to get chair to bed.   Balance Skill Training   Comment with UE support in standing   Sitting Balance: Static fair + balance   Sitting, Dynamic (Balance) fair - balance   Sit-to-Stand Balance fair - balance   Standing Balance: Static fair - balance   Standing Balance: Dynamic poor + balance   Systems Impairment Contributing to Balance Disturbance musculoskeletal   Identified Impairments Contributing to Balance Disturbance impaired coordination;impaired postural control;decreased strength   Post Treatment Status   Post Treatment Patient Status Patient supine in bed;Call light within reach;Telephone within reach   Support Present Post Treatment  Family present   Communication [place] With patient;spouse   Basic Mobility Am-PAC/6Clicks Score (APPROVED Staff)   Turning in bed without bedrails 3   Lying on back to sitting on edge of flat bed 3   Moving to and from a bed to a chair 3   Standing up from chair 3   Walk in room 3   Climbing 3-5 steps with railing 2   6 Clicks Raw Score total 17   Standardized (t-scale) score 39.67   CMS 0-100% Score 43.83   CMS Modifier CK   Patient Mobility Goal (JHHLM) 5- Stand 1 minute or more 3X/day   Exercise/Activity Level Performed 4- Transferred to  chair/commode   Physical Therapy Clinical Impression   Assessment Mr. Rodda was alert, distressed, and requesting to get back to bed. He had improvement with sit to stand but overall limited tol to gt/activity due to nausea. He continues to demonstrate generalized weakness, decreased activity tol, impaired balance, and impaired mobility. His mobility is not currently adequate for safe d/c to home but he has good rehab potential. Rec IRF at d/c.   Anticipated Equipment Needs at Discharge (PT) TBD   Anticipated Discharge Disposition inpatient rehabilitation facility       Therapist:   Norlene Duel, PT   Pager #: [phone]

## 2021-05-17 DIAGNOSIS — K219 Gastro-esophageal reflux disease without esophagitis: Secondary | ICD-10-CM

## 2021-05-17 DIAGNOSIS — R112 Nausea with vomiting, unspecified: Secondary | ICD-10-CM

## 2021-05-17 DIAGNOSIS — R627 Adult failure to thrive: Secondary | ICD-10-CM

## 2021-05-17 DIAGNOSIS — T8089XA Other complications following infusion, transfusion and therapeutic injection, initial encounter: Secondary | ICD-10-CM

## 2021-05-17 DIAGNOSIS — Z515 Encounter for palliative care: Secondary | ICD-10-CM

## 2021-05-17 LAB — BASIC METABOLIC PANEL
ANION GAP: 6 mmol/L (ref 4–13)
BUN/CREA RATIO: 43 — ABNORMAL HIGH (ref 6–22)
BUN: 28 mg/dL — ABNORMAL HIGH (ref 8–25)
CALCIUM: 8.3 mg/dL — ABNORMAL LOW (ref 8.8–10.2)
CHLORIDE: 111 mmol/L (ref 96–111)
CO2 TOTAL: 25 mmol/L (ref 23–31)
CREATININE: 0.65 mg/dL — ABNORMAL LOW (ref 0.75–1.35)
ESTIMATED GFR: >90 mL/min/BSA (ref >=60)
GLUCOSE: 216 mg/dL — ABNORMAL HIGH (ref 65–125)
POTASSIUM: 3.2 mmol/L — ABNORMAL LOW (ref 3.5–5.1)
SODIUM: 142 mmol/L (ref 136–145)

## 2021-05-17 LAB — CBC
HCT: 22.2 % — ABNORMAL LOW (ref 38.9–52.0)
HGB: 6.7 g/dL — CL (ref 13.4–17.5)
MCH: 26.5 pg (ref 26.0–32.0)
MCHC: 30.2 g/dL — ABNORMAL LOW (ref 31.0–35.5)
MCV: 87.7 fL (ref 78.0–100.0)
PLATELETS: 533 10*3/uL — ABNORMAL HIGH (ref 150–400)
RBC: 2.53 10*6/uL — ABNORMAL LOW (ref 4.50–6.10)
RDW-CV: 17.6 % — ABNORMAL HIGH (ref 11.5–15.5)
WBC: 26 10*3/uL — ABNORMAL HIGH (ref 3.7–11.0)

## 2021-05-17 LAB — PHOSPHORUS: PHOSPHORUS: 3 mg/dL (ref 2.3–4.0)

## 2021-05-17 LAB — ECG 12-LEAD
Atrial Rate: 71 {beats}/min
Calculated P Axis: 75 degrees
Calculated R Axis: 26 degrees
Calculated T Axis: 55 degrees
PR Interval: 128 ms
QRS Duration: 74 ms
QT Interval: 442 ms
QTC Calculation: 480 ms
Ventricular rate: 71 {beats}/min

## 2021-05-17 LAB — BPAM PACKED CELL ORDER

## 2021-05-17 LAB — POC BLOOD GLUCOSE (RESULTS)
GLUCOSE, POC: 265 mg/dl — ABNORMAL HIGH (ref 70–105)
GLUCOSE, POC: 272 mg/dl — ABNORMAL HIGH (ref 70–105)
GLUCOSE, POC: 144 mg/dl — ABNORMAL HIGH (ref 70–105)
GLUCOSE, POC: 149 mg/dl — ABNORMAL HIGH (ref 70–105)

## 2021-05-17 LAB — C-REACTIVE PROTEIN(CRP),INFLAMMATION: CRP INFLAMMATION: 32.3 mg/L — ABNORMAL HIGH (ref <8.0)

## 2021-05-17 LAB — TRIGLYCERIDE: TRIGLYCERIDES: 132 mg/dL (ref <150)

## 2021-05-17 LAB — TYPE AND CROSS RED CELLS - UNITS: ABO/RH(D): A NEG

## 2021-05-17 LAB — AST (SGOT): AST (SGOT): 15 U/L (ref 8–45)

## 2021-05-17 LAB — PROCALCITONIN REFLEX 6HR: PROCALCITONIN: 0.11 ng/mL (ref <0.50)

## 2021-05-17 LAB — ALT (SGPT): ALT (SGPT): 27 U/L (ref 10–55)

## 2021-05-17 LAB — PTT (PARTIAL THROMBOPLASTIN TIME): APTT: 70.6 seconds — ABNORMAL HIGH (ref 24.2–37.5)

## 2021-05-17 LAB — ALK PHOS (ALKALINE PHOSPHATASE): ALKALINE PHOSPHATASE: 80 U/L (ref 45–115)

## 2021-05-17 LAB — MAGNESIUM: MAGNESIUM: 1.8 mg/dL (ref 1.8–2.6)

## 2021-05-17 LAB — BILIRUBIN TOTAL: BILIRUBIN TOTAL: 0.2 mg/dL — ABNORMAL LOW (ref 0.3–1.3)

## 2021-05-17 LAB — PROCALCITONIN ALGORITHM: PROCALCITONIN: 0.07 ng/mL (ref <0.50)

## 2021-05-17 MED ORDER — INSULIN REGULAR HUMAN 100 UNIT/ML INJECTION - FOR TUBE FEED
14.0000 [IU] | Freq: Three times a day (TID) | INTRAMUSCULAR | Status: DC
Start: 2021-05-17 — End: 2021-05-18
  Administered 2021-05-17 – 2021-05-18 (×2): 14 [IU] via SUBCUTANEOUS
  Administered 2021-05-18: 0 [IU] via SUBCUTANEOUS

## 2021-05-17 MED ORDER — WATER FOR INJECTION, STERILE INTRAVENOUS SOLUTION
INTRAVENOUS | Status: AC
Start: 2021-05-17 — End: 2021-05-18
  Filled 2021-05-17: qty 1000

## 2021-05-17 MED ORDER — INSULIN REGULAR HUMAN 100 UNIT/ML INJECTION - FOR TUBE FEED
16.0000 [IU] | INTRAMUSCULAR | Status: DC
Start: 2021-05-17 — End: 2021-05-17
  Administered 2021-05-17: 0 [IU] via SUBCUTANEOUS
  Filled 2021-05-17: qty 0.16

## 2021-05-17 MED ORDER — SODIUM CHLORIDE 0.9 % IV BOLUS
40.0000 mL | INJECTION | Freq: Once | Status: AC | PRN
Start: 2021-05-17 — End: 2021-05-17

## 2021-05-17 MED ORDER — MIRTAZAPINE 7.5 MG TABLET
7.5000 mg | ORAL_TABLET | Freq: Every evening | ORAL | Status: DC
Start: 2021-05-17 — End: 2021-05-28
  Administered 2021-05-17 – 2021-05-27 (×11): 7.5 mg via ORAL
  Filled 2021-05-17 (×11): qty 1

## 2021-05-17 MED ORDER — POTASSIUM CHLORIDE ER 20 MEQ TABLET,EXTENDED RELEASE(PART/CRYST)
40.0000 meq | ORAL_TABLET | ORAL | Status: AC
Start: 2021-05-17 — End: 2021-05-17
  Administered 2021-05-17: 40 meq via ORAL
  Filled 2021-05-17: qty 2

## 2021-05-17 MED ORDER — SODIUM CHLORIDE 0.9 % INTRAVENOUS SOLUTION
10.0000 mg/kg | INTRAVENOUS | Status: DC
Start: 2021-05-18 — End: 2021-05-20
  Administered 2021-05-18: 0 mg via INTRAVENOUS
  Administered 2021-05-18: 700 mg via INTRAVENOUS
  Administered 2021-05-19: 0 mg via INTRAVENOUS
  Administered 2021-05-19 – 2021-05-20 (×2): 700 mg via INTRAVENOUS
  Administered 2021-05-20: 0 mg via INTRAVENOUS
  Filled 2021-05-17 (×3): qty 14

## 2021-05-17 MED ORDER — LORAZEPAM 2 MG/ML INJECTION WRAPPER
0.5000 mg | Freq: Three times a day (TID) | INTRAMUSCULAR | Status: DC | PRN
Start: 2021-05-17 — End: 2021-05-18
  Administered 2021-05-17: 0.5 mg via INTRAVENOUS
  Filled 2021-05-17: qty 1

## 2021-05-17 MED ORDER — PROCHLORPERAZINE EDISYLATE 10 MG/2 ML (5 MG/ML) INJECTION SOLUTION
10.0000 mg | Freq: Four times a day (QID) | INTRAMUSCULAR | Status: DC | PRN
Start: 2021-05-17 — End: 2021-05-28
  Administered 2021-05-21 – 2021-05-23 (×7): 10 mg via INTRAVENOUS
  Administered 2021-05-24: 0 mg via INTRAVENOUS
  Administered 2021-05-25 – 2021-05-27 (×3): 10 mg via INTRAVENOUS
  Filled 2021-05-17 (×11): qty 2

## 2021-05-17 MED ORDER — POTASSIUM CHLORIDE 10 MEQ/100ML IN STERILE WATER INTRAVENOUS PIGGYBACK
10.0000 meq | INJECTION | INTRAVENOUS | Status: AC
Start: 2021-05-17 — End: 2021-05-17
  Administered 2021-05-17 (×3): 10 meq via INTRAVENOUS
  Administered 2021-05-17 (×2): 0 meq via INTRAVENOUS
  Administered 2021-05-17: 10 meq via INTRAVENOUS
  Administered 2021-05-17: 0 meq via INTRAVENOUS
  Filled 2021-05-17 (×3): qty 100

## 2021-05-17 NOTE — Progress Notes (Signed)
[facility]  Surgical Oncology  Progress Note      [name], [name], 76 y.o. male  Date of Birth:  Jun 07, 1945  Date of Admission:  04/03/2021  Date of service: 05/17/2021    Assessment:  This is a 76 y.o. 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 He underwent massive transfusion protocol and ex lap on 04/03/21 at an outside facility with ligation of a bleeding vessel in the liver bed. Patient was subsequently transferred to [facility] postoperaively, intubated. Patient was subsequently extubated later that day on 3/4. 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 He failed advancing his diet to fulls the week of 4/08, backed off to TPN / clears after he developed severe nausea.    Plan/Recommendations:    - Positive blood culture    - One of two bottles positive for GPCs, plan to repeat today   - Repeat blood cultures ordered - please draw one from PICC and one peripheral   - Continue antibiotics at this time, deescalate when able  - Nausea:   QTc prolonged at 480 this morning   Keep PRN Zofran & PRN Phenergan PO  - Continue heparin gtt  - Diet: Advance to regular diet today  - Hgb < 7 - will transfuse 1U PRBC this morning  - Peripancreatic fluid collections   - Advanced GI performed EUS with cystogastrostomy 4/6              - Blood cultures 3/9: + pseudomonas    - Cultures form R JP: Pseudomonas and enterococcus              - ID consulted; Daptomycin and meropenum (completed 4/10)  - PE   - s/p IVC filter placement 3/17  - IVF: TPN  - Midline fistula with ostomy bag to monitor output  - OOB TID, encourage ambulation  - Hypokalemia - replete this morning with PO and IV runs  - Pulmonary toilet, keep  IS accessible at bedside  - PT/OT ordered, encourage pt to work with therapy  - Dispo: Floor      Subjective:   Pt seen in NAD. He is asking for a diet. He denies vomiting, reflux, or diarrhea. States his last BM was 2 days ago and was liquid consistency. No acute complaints. Asking for compazine.     Objective  Filed Vitals:    05/16/21 1935 05/17/21 0013 05/17/21 0426 05/17/21 0727   BP: (!) 141/86 (!) 141/69 128/65 125/75   Pulse: 88 85 73 67   Resp: _0 Temp: 36.4 C (97.5 F) 36.7 C (98.1 F) 36.4 C (97.5 F) 36.8 C (98.2 F)   SpO2: 94% 91% 94% 93%     Physical Exam:   GEN:  AOx4, resting in bed, no acute distress  PULM: Normal respiratory effort. Equal/symmetric chest rise.  CV:  Pink, well perfused, RRR  ABD:   Abdomen soft, nontender, nondistended. Lower midline incision with Eakins pouch    MS: Atraumatic. Moves all extremities.  NEURO:   Alert and oriented to person, place and time.  Cranial nerves grossly intact.    Integumentary:  Pink, warm, and dry  PSYCHOSOCIAL: Pleasant.  Normal  affect.     Labs  Results for orders placed or performed during the hospital encounter of 04/03/21 (from the past 24 hour(s))   BASIC METABOLIC PANEL   Result Value Ref Range    SODIUM 142 136 - 145 mmol/L    POTASSIUM 3.2 (L) 3.5 - 5.1 mmol/L    CHLORIDE 111 96 - 111 mmol/L    CO2 TOTAL 25 23 - 31 mmol/L    ANION GAP 6 4 - 13 mmol/L    CALCIUM 8.3 (L) 8.8 - 10.2 mg/dL    GLUCOSE 216 (H) 65 - 125 mg/dL    BUN 28 (H) 8 - 25 mg/dL    CREATININE 0.65 (L) 0.75 - 1.35 mg/dL    BUN/CREA RATIO 43 (H) 6 - 22    ESTIMATED GFR >90 >=60 mL/min/BSA   CBC   Result Value Ref Range    WBC 26.0 (H) 3.7 - 11.0 x10^3/uL    RBC 2.53 (L) 4.50 - 6.10 x10^6/uL    HGB 6.7 (LL) 13.4 - 17.5 g/dL    HCT 22.2 (L) 38.9 - 52.0 %    MCV 87.7 78.0 - 100.0 fL    MCH 26.5 26.0 - 32.0 pg    MCHC 30.2 (L) 31.0 - 35.5 g/dL    RDW-CV 17.6 (H) 11.5 - 15.5 %    PLATELETS 533 (H) 150 - 400 x10^3/uL    Narrative    Mean Platelet Volume - Not  Reported  HemoglobinTest(s) repeated and results duplicated.   MAGNESIUM   Result Value Ref Range    MAGNESIUM 1.8 1.8 - 2.6 mg/dL   PHOSPHORUS   Result Value Ref Range    PHOSPHORUS 3.0 2.3 - 4.0 mg/dL   PTT (PARTIAL THROMBOPLASTIN TIME) - AM ONCE   Result Value Ref Range    APTT 70.6 (H) 24.2 - 37.5 seconds    Narrative    Therapeutic range for unfractionated heparin is 60-100 seconds.   TRIGLYCERIDE   Result Value Ref Range    TRIGLYCERIDES 132 <150 mg/dL   AST (SGOT)   Result Value Ref Range    AST (SGOT)  15 8 - 45 U/L   ALT (SGPT)   Result Value Ref Range    ALT (SGPT) 27 10 - 55 U/L   ALK PHOS (ALKALINE PHOSPHATASE)   Result Value Ref Range    ALKALINE PHOSPHATASE 80 45 - 115 U/L   BILIRUBIN TOTAL   Result Value Ref Range    BILIRUBIN TOTAL 0.2 (L) 0.3 - 1.3 mg/dL   POC BLOOD GLUCOSE (RESULTS)   Result Value Ref Range    GLUCOSE, POC 379 (H) 70 - 105 mg/dl   POC BLOOD GLUCOSE (RESULTS)   Result Value Ref Range    GLUCOSE, POC 234 (H) 70 - 105 mg/dl   POC BLOOD GLUCOSE (RESULTS)   Result Value Ref Range    GLUCOSE, POC 191 (H) 70 - 105 mg/dl   POC BLOOD GLUCOSE (RESULTS)   Result Value Ref Range    GLUCOSE, POC 265 (H) 70 - 105 mg/dl   POC BLOOD GLUCOSE (RESULTS)   Result Value Ref Range    GLUCOSE, POC 272 (H) 70 - 105 mg/dl       I/O:  Date 05/16/21 0700 - 05/17/21 0659 05/17/21 0700 - 05/18/21 0659   Shift 0700-1459 1500-2259 2300-0659 24 Hour Total 0700-1459 1500-2259 2300-0659 24 Hour Total   INTAKE   P.O. 670   670 260   260  Oral 670   670 260   260   I.V.(mL/kg/hr) 1610(9.60)   1870(1.08)         Volume Infused (adult custom variable rate parenteral nutrition) 1870   1870       IV Piggyback  120  120 120   120     Volume (meropenem (MERREM) 1 g in NS 100 mL IVPB)  120  120 120   120   Shift Total(mL/kg) 4540(98.11) 120(1.66)  9147(82.95) 380(5.27)   380(5.27)   OUTPUT   Urine(mL/kg/hr) 1050(1.82) 400(0.69) 450(0.78) 1900(1.1) 500   500     Urine (Voided) 1050 (646) 680-5560 500   500     Urine  Occurrence   0 x 0 x       Drains   0 0         Drain Output (Drain (Miscellaneous) peds ostomy bag Lower;Medial Abdomen)   0 0       Stool             Stool Occurrence 1 x  0 x 1 x 2 x   2 x   Shift Total(mL/kg) 6213(08.65) 400(5.55) 450(6.24) 1900(26.35) 500(6.93)   500(6.93)   Weight (kg) 72.1 72.1 72.1 72.1 72.1 72.1 72.1 72.1       Radiology:  05/10/21 CT C/A/P   1.Postsurgical changes from distal pancreatectomy and splenectomy with persistent fluid collections along the surgical beds with two percutaneous drainage catheters noted. The fluid collection most closely approximating the pancreatic body shows marked interval decreased size status post stent placements, while the other collections similar to minimally decreased in size. No new organized intra-abdominal collections are identified.  2.Midline abdominal wall incision with underlying fluid and inflammatory changes are also similar to prior.   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 However, there is redemonstration of closely approximating transverse colon to the anterior abdominal wall.  4.Small left-sided pleural effusion with associated passive atelectasis.  5.Small volume left upper quadrant intra-abdominal free fluid and diffuse mesenteric stranding throughout.     Current Medications:  Current Facility-Administered Medications   Medication Dose Route Frequency   . acetaminophen (TYLENOL) tablet  500 mg Oral Q4H PRN   . adult custom variable rate parenteral nutrition   Intravenous Continuous   . atorvastatin (LIPITOR) tablet  40 mg Oral Daily with Breakfast   . benzonatate (TESSALON) capsule  100 mg Oral Q8H PRN   . D5W 250 mL flush bag   Intravenous Q15 Min PRN   . DAPTOmycin (CUBICIN) 450 mg in NS 50 mL IVPB  6 mg/kg Intravenous Q24H   . elvitegravir-cobicistat-emtricitrabine-tenofovir (GENVOYA) 150-150-200-10 mg per  tablet  1 Tablet Oral Daily   . gabapentin (NEURONTIN) capsule  300 mg Oral 3x/day   . heparin 25,000 units in NS 250 mL infusion  18 Units/kg/hr (Adjusted) Intravenous Continuous   . insulin NPH human 100 units/mL injection  20 Units Subcutaneous Daily before Breakfast   . insulin NPH human 100 units/mL injection  30 Units Subcutaneous Daily after Dinner   . insulin R human (HUMULIN R) 100 units/mL injection - for tube feed coverage  12 Units Subcutaneous 3 times per day   . melatonin tablet  6 mg Oral NIGHTLY   . meropenem (MERREM) 1 g in NS 100 mL IVPB  1 g Intravenous Q8H   . multivitamin-minerals-folic acid-lycopene-lutein (CERTAVITE SENIOR) tablet  1 Tablet Oral Q24H   .  NS 250 mL flush bag   Intravenous Q15 Min PRN   . NS bolus infusion 40 mL  40 mL Intravenous Once PRN   . NS flush syringe  10-30 mL Intracatheter Q8HRS   . NS flush syringe  20-30 mL Intracatheter Q1 MIN PRN   . NS flush syringe  2-6 mL Intracatheter Q8HRS   . NS flush syringe  2-6 mL Intracatheter Q1 MIN PRN   . ondansetron (ZOFRAN) 2 mg/mL injection  4 mg Intravenous Q6H PRN   . oxyCODONE (ROXICODONE) immediate release tablet  2.5 mg Oral Q4H PRN   . oxyCODONE (ROXICODONE) immediate release tablet  5 mg Oral Q4H PRN   . pantoprazole (PROTONIX) delayed release tablet  40 mg Oral Q12H   . potassium chloride 10 mEq in SW 100 mL premix infusion  10 mEq Intravenous Q1H   . promethazine (PHENERGAN) tablet  6.25 mg Oral Q6H PRN   . sennosides-docusate sodium (SENOKOT-S) 8.6-50mg per tablet  1 Tablet Oral 2x/day   . SSIP insulin R human (HUMULIN R) 100 units/mL injection  0-12 Units Subcutaneous Q6H PRN       Donna Christen, MD  General Surgery // PGY-1  [place]

## 2021-05-17 NOTE — Consults (Signed)
PATIENT NAME: [name] NUMBER:  [id number]  DATE OF SERVICE: 05/17/2021  DATE OF BIRTH:  04-Feb-1945    INFECTIOUS DISEASE CONSULT FOLLOWUP NOTE    Length of hospital stay: 44 days.    REASON FOR CONSULT:  Leukocytosis     SUBJECTIVE:  Patient is resting in bed. He reports overall doing well without abdominal pain. No fever or chills. No new rashes.     OBJECTIVE:  Filed Vitals:    05/17/21 1243 05/17/21 1309 05/17/21 1330 05/17/21 1402   BP: 137/76 (!) 151/83 138/82 (!) 143/82   Pulse: 73 77 74 74   Resp: '18 18 18 18   '$ Temp: 36.4 C (97.5 F) 36.7 C (98.1 F) 36.7 C (98.1 F) 36.8 C (98.2 F)   SpO2: 93% 95% 96% 95%     Constitutional: Chronically ill elderly gentleman. Non-toxic.    Neurologic: Alert and oriented to person, place, and time.   Eyes: Sclera non-icteric, conjunctiva non-injected.   HENT: Buccal mucosa moist. Healthy dentition present. No intra oral candidiasis.   Respiratory: Respirations are non labored on room air. Full breath sounds bilaterally without crackles or wheeze.   Cardiovascular: Heart has a regular rate and rhythm without murmur.  Gastrointestinal: Abdomen with midline incision site and staples in place to superior aspect. Ostomy over the inferior incision site producing tan colored turbid fluid. Abdomen is non-tender to palpation in all 4 quadrants. Does have some firmness to LUQ.   Genitourinary: External genitalia is normal. No intertrigo.   Musculoskeletal: No swelling of the upper or lower extremities. Moves all 4 extremities spontaneously.   Integumentary: Skin is warm and non-diaphoretic. No diffuse cutaneous rashes. PICC line to the LUE.     ANTIBIOTICS:  1. Vancomycin, cefepime, and metronidazole 3/9 - 04/12/2021    2. Daptomycin and meropenem 04/12/2021 - 05/09/2021  3. Micafungin 04/25/2021 - 04/27/2021  4. Fluconazole 3/29 - 05/09/2021  5. Daptomycin and Meropenem 05/16/2021 -     LINES:  Patient Lines/Drains/Airways Status       Active Line / Dialysis Catheter /  Dialysis Graft / Drain / Airway / Wound       Name Placement date Placement time Site Days    Peripheral IV Left Cephalic  (lateral side of arm) 05/16/21  0225  -- 1    PICC Triple Lumen Lumen 1 Red Lumen 2 White Lumen 3 Gray Left;Brachial Vein Central 04/29/21  1055  4 FR  18    Drain (Miscellaneous) peds ostomy bag Lower;Medial Abdomen 04/12/21  1300  -- 35    Surgical Incision Mid;Anterior Abdomen 04/03/21  1046  -- 44    Surgical Incision Other (Comment) Left;Upper Abdomen 04/03/21  1400  -- 43                  LABS:  Results for orders placed or performed during the hospital encounter of 04/03/21 (from the past 24 hour(s))   BASIC METABOLIC PANEL   Result Value Ref Range    SODIUM 142 136 - 145 mmol/L    POTASSIUM 3.2 (L) 3.5 - 5.1 mmol/L    CHLORIDE 111 96 - 111 mmol/L    CO2 TOTAL 25 23 - 31 mmol/L    ANION GAP 6 4 - 13 mmol/L    CALCIUM 8.3 (L) 8.8 - 10.2 mg/dL    GLUCOSE 216 (H) 65 - 125 mg/dL    BUN 28 (H) 8 - 25 mg/dL    CREATININE 0.65 (L) 0.75 -  1.35 mg/dL    BUN/CREA RATIO 43 (H) 6 - 22    ESTIMATED GFR >90 >=60 mL/min/BSA   CBC   Result Value Ref Range    WBC 26.0 (H) 3.7 - 11.0 x10^3/uL    RBC 2.53 (L) 4.50 - 6.10 x10^6/uL    HGB 6.7 (LL) 13.4 - 17.5 g/dL    HCT 22.2 (L) 38.9 - 52.0 %    MCV 87.7 78.0 - 100.0 fL    MCH 26.5 26.0 - 32.0 pg    MCHC 30.2 (L) 31.0 - 35.5 g/dL    RDW-CV 17.6 (H) 11.5 - 15.5 %    PLATELETS 533 (H) 150 - 400 x10^3/uL    Narrative    Mean Platelet Volume - Not Reported  HemoglobinTest(s) repeated and results duplicated.   MAGNESIUM   Result Value Ref Range    MAGNESIUM 1.8 1.8 - 2.6 mg/dL   PHOSPHORUS   Result Value Ref Range    PHOSPHORUS 3.0 2.3 - 4.0 mg/dL   PTT (PARTIAL THROMBOPLASTIN TIME) - AM ONCE   Result Value Ref Range    APTT 70.6 (H) 24.2 - 37.5 seconds    Narrative    Therapeutic range for unfractionated heparin is 60-100 seconds.   TRIGLYCERIDE   Result Value Ref Range    TRIGLYCERIDES 132 <150 mg/dL   AST (SGOT)   Result Value Ref Range    AST (SGOT)  15 8 -  45 U/L   ALT (SGPT)   Result Value Ref Range    ALT (SGPT) 27 10 - 55 U/L   ALK PHOS (ALKALINE PHOSPHATASE)   Result Value Ref Range    ALKALINE PHOSPHATASE 80 45 - 115 U/L   BILIRUBIN TOTAL   Result Value Ref Range    BILIRUBIN TOTAL 0.2 (L) 0.3 - 1.3 mg/dL   TYPE AND CROSS RED CELLS - UNITS , 1 Units   Result Value Ref Range    UNITS ORDERED 1     SPECIMEN EXPIRATION DATE 05/20/2021,2359     ABO/RH(D) A NEGATIVE     ANTIBODY SCREEN NEGATIVE    BPAM PACKED CELL ORDER   Result Value Ref Range    Coding System [id number]     UNIT NUMBER [id number]     BLOOD COMPONENT TYPE LR RBC, Adsol3, 04761     UNIT DIVISION 00     UNIT DISPENSE STATUS ISSUED     TRANSFUSION STATUS OK TO TRANSFUSE     IS CROSSMATCH Electronically Compatible     Product Code [id number]    POC BLOOD GLUCOSE (RESULTS)   Result Value Ref Range    GLUCOSE, POC 234 (H) 70 - 105 mg/dl   POC BLOOD GLUCOSE (RESULTS)   Result Value Ref Range    GLUCOSE, POC 191 (H) 70 - 105 mg/dl   POC BLOOD GLUCOSE (RESULTS)   Result Value Ref Range    GLUCOSE, POC 265 (H) 70 - 105 mg/dl   POC BLOOD GLUCOSE (RESULTS)   Result Value Ref Range    GLUCOSE, POC 272 (H) 70 - 105 mg/dl     MICROBIOLOGY DATA:  1. Blood cultures x2 sets obtained 04/24/2021, are positive in 1 out of 2 sets for Candida albicans. Positive set obtained from the PICC line.   2. Blood cultures x 2 04/26/2021 are negative.   3. Blood cultures x 2 05/05/2021 are negative.   4.  Blood cultures x 05/09/2021 are negative.   5. Blood cultures x 2 05/14/2021 are positive  in 1/2 sets for E. Faecium. Positive set from peripheral stick. PICC line culture negative.   6. Blood cultures 05/17/2021 are pending.     IMAGING:  Imaging has been reviewed.     ASSESSMENT:  Mr. 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 He developed post operative complications with bleeding at the postsurgical site, status post ex lap with ligation of bleeding vessels on 04/03/2021, and subsequent embolization of splenic artery and left inferior adrenal artery on 04/09/2021.  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 IR placed a drain to RUQ on 04/09/2021 with cultures positive for Pseudomonas aeruginosa and Enterococcus faecium.  He was also identified to have a Pseudomonas bacteremia on 3/9. In total he was treated with a month of Daptomycin and meropenem completed 05/09/2021. 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 Off antibiotics the patient developed worsening leukocytosis with repeat blood cultures 05/14/2021 now positive for Enterococcus faecium. Cultures were not suggestive of line related bacteremia. Repeat blood cultures are pending. Suspect source may be related to residual intra-abdominal abscess.     1. Continue to follow pending blood cultures and susceptibility testing.  2. Repeat CT A/P with contrast for evaluation of intra-abdominal abscess.   3. Agree with repeating CRP and PCL.   4. Continue daptomycin. Increase dose to 10 mg/kg IV q.24 hours.  5. Continue meropenem 1 gram IV q.8 hours.  6. If the patient decompensates on above regimen would have low threshold for initiation of micafungin.   7. Continue Genvoya.   8. Follow CBC + differential, BMP, LFT panel, and CRP for safety and monitoring while on antibiotics.   Please call with any further questions.    I independently of the faculty provider spent a total of 25 minutes in direct/indirect care of this patient including initial evaluation, review of laboratory, radiology, diagnostic studies, review of medical record, order entry and coordination of care.      Monica Martinez, PA-C  05/17/2021, 16:13      I saw and examined the patient.  I reviewed the ID PA-C's note.  I agree with the findings and plan of care as documented in the ID PA-C's note.  Any exceptions/additions are edited/noted.    I personally saw and evaluated the patient as part of a shared service with an APP.    My substantive findings are:  Patient had been off Abx for some time (about a week), but due to elevating WBC, had repeat blood culture drawn now w Enterococcus.  Most likely from [facility].  Suggest re-image.  Clinically looks well and really with minimal complaints.  Taking HIV meds (Genvoya).    I independently of the APP spent a total of (20) minutes in direct/indirect care of this patient including initial evaluation, review of laboratory, radiology, diagnostic studies, review of medical record, order entry and coordination of care.      Laurian Brim, MD

## 2021-05-17 NOTE — Consults (Signed)
[facility] Consult  Follow Up Note    [name], [name], 76 y.o. male  Date of Service: 05/17/2021  Date of Birth:  Sep 03, 1945    Hospital Day:  LOS: 44 days     Assessment/Recommendations: Encounter for Palliative Care; Adult Failure to Thrive secondary to active extravasation at surgical site from previous distal pancreatectomy and splenectomy for IPMN on 03/23/21, pancreatic fluid collection.  Persistent nausea/vomiting minimally relieved from Zofran/Compazine.    On exam this afternoon the patient is sitting up in bed, he is awake, alert and oriented x3. The patient reports that he is tolerating his full liquid diet and his nausea seems better than it was last week. He still complains of some reflux with taking pills and has significant anticipatory nausea prior to taking his morning medications. He states around 8am he starts to get very worried about all of the pills he has to take and some days he will start gagging prior to even taking them. Given his anticipatory nausea would strongly recommend starting Ativan PRN prior to giving morning pills to help relieve this nausea. We discussed why his Zyprexa was discontinued (prolonged QTC) and the patient seems to understand that other medications can also prolong this, he states he would like to try using medications for nausea that would not have this side effect. He reports 3 BMs today and 2 yesterday. He has no pain and is hopeful to be discharged later this week to rehab. All questions and concerns were answered and the primary team was updated with recommendations. Supportive care will follow.     Recommend:  -Remeron 7.51m PO nightly for sleep and nausea   -Ativan 0.577mIV every 8 hours PRN for nausea- would recommend giving 30 minutes prior to morning medication administration as this causes significant anticipatory nausea   -Protonix 40 mg IV BID  -Monitor EKG, especially if continuing medications such as  Compazine and Phenergan     Pain: Numeric 0    POST completed:  POST form not discussed    Subjective: Patient reports he is able to tolerate oral intake today     Objective:  Temperature: 36.8 C (98.2 F)  Heart Rate: 74  BP (Non-Invasive): (!) 143/82 (RN Notified)  Respiratory Rate: 18  SpO2: 95 %     Constitutional:  appears chronically ill, acutely ill, appears stated age, mild distress and vital signs reviewed  Eyes:  Conjunctiva clear., Pupils equal and round.   ENT:  Mouth mucous membranes moist.   Neck:  supple, symmetrical, trachea midline  Respiratory:  Clear to auscultation bilaterally.   Cardiovascular:  regular rate and rhythm  Gastrointestinal:  soft, non-tender, positive bowel sounds  Musculoskeletal:  Head atraumatic and normocephalic and AROM  Integumentary:  Skin warm and dry  Neurologic:  Alert and oriented x3  Other symptoms present: None    Labs:    Lab Results Today:    Results for orders placed or performed during the hospital encounter of 04/03/21 (from the past 24 hour(s))   POC BLOOD GLUCOSE (RESULTS)   Result Value Ref Range    GLUCOSE, POC 234 (H) 70 - 105 mg/dl   POC BLOOD GLUCOSE (RESULTS)   Result Value Ref Range    GLUCOSE, POC 191 (H) 70 - 105 mg/dl   POC BLOOD GLUCOSE (RESULTS)   Result Value Ref Range    GLUCOSE, POC 265 (H) 70 - 105 mg/dl   BASIC METABOLIC PANEL   Result  Value Ref Range    SODIUM 142 136 - 145 mmol/L    POTASSIUM 3.2 (L) 3.5 - 5.1 mmol/L    CHLORIDE 111 96 - 111 mmol/L    CO2 TOTAL 25 23 - 31 mmol/L    ANION GAP 6 4 - 13 mmol/L    CALCIUM 8.3 (L) 8.8 - 10.2 mg/dL    GLUCOSE 216 (H) 65 - 125 mg/dL    BUN 28 (H) 8 - 25 mg/dL    CREATININE 0.65 (L) 0.75 - 1.35 mg/dL    BUN/CREA RATIO 43 (H) 6 - 22    ESTIMATED GFR >90 >=60 mL/min/BSA   CBC   Result Value Ref Range    WBC 26.0 (H) 3.7 - 11.0 x10^3/uL    RBC 2.53 (L) 4.50 - 6.10 x10^6/uL    HGB 6.7 (LL) 13.4 - 17.5 g/dL    HCT 22.2 (L) 38.9 - 52.0 %    MCV 87.7 78.0 - 100.0 fL    MCH 26.5 26.0 - 32.0 pg    MCHC 30.2  (L) 31.0 - 35.5 g/dL    RDW-CV 17.6 (H) 11.5 - 15.5 %    PLATELETS 533 (H) 150 - 400 x10^3/uL   MAGNESIUM   Result Value Ref Range    MAGNESIUM 1.8 1.8 - 2.6 mg/dL   PHOSPHORUS   Result Value Ref Range    PHOSPHORUS 3.0 2.3 - 4.0 mg/dL   PTT (PARTIAL THROMBOPLASTIN TIME) - AM ONCE   Result Value Ref Range    APTT 70.6 (H) 24.2 - 37.5 seconds   TRIGLYCERIDE   Result Value Ref Range    TRIGLYCERIDES 132 <150 mg/dL   AST (SGOT)   Result Value Ref Range    AST (SGOT)  15 8 - 45 U/L   ALT (SGPT)   Result Value Ref Range    ALT (SGPT) 27 10 - 55 U/L   ALK PHOS (ALKALINE PHOSPHATASE)   Result Value Ref Range    ALKALINE PHOSPHATASE 80 45 - 115 U/L   BILIRUBIN TOTAL   Result Value Ref Range    BILIRUBIN TOTAL 0.2 (L) 0.3 - 1.3 mg/dL   ECG 12-LEAD   Result Value Ref Range    Ventricular rate 71 BPM    Atrial Rate 71 BPM    PR Interval 128 ms    QRS Duration 74 ms    QT Interval 442 ms    QTC Calculation 480 ms    Calculated P Axis 75 degrees    Calculated R Axis 26 degrees    Calculated T Axis 55 degrees   POC BLOOD GLUCOSE (RESULTS)   Result Value Ref Range    GLUCOSE, POC 272 (H) 70 - 105 mg/dl   TYPE AND CROSS RED CELLS - UNITS , 1 Units   Result Value Ref Range    UNITS ORDERED 1     SPECIMEN EXPIRATION DATE 05/20/2021,2359     ABO/RH(D) A NEGATIVE     ANTIBODY SCREEN NEGATIVE    BPAM PACKED CELL ORDER   Result Value Ref Range    Coding System [id number]     UNIT NUMBER [id number]     BLOOD COMPONENT TYPE LR RBC, Adsol3, 04761     UNIT DIVISION 00     UNIT DISPENSE STATUS ISSUED     TRANSFUSION STATUS OK TO TRANSFUSE     IS CROSSMATCH Electronically Compatible     Product Code [id number]        Ventilator Settings: N/A  HFNC Settings: N/A  BIPAP Settings: N/A    Imaging Studies:  Images and Reports reviewed to current date.    Did this encounter involve a discussion of hospice? No    Consultant:  Charise Carwin, APRN,FNP-BC    On the day of the encounter, a total of  30 minutes was spent on this patient encounter including  review of historical information, examination, documentation and post-visit activities. The time documented excludes procedural time.    Charise Carwin, APRN,FNP-BC

## 2021-05-17 NOTE — Nurses Notes (Signed)
Paged surg onc service with Critical Hemoglobin awaiting return call.

## 2021-05-17 NOTE — Nurses Notes (Addendum)
0933 - Page to surg onc first on call  [phone], blood bank needs T&C, T&S, and crossmatch in order for [place] to transfuse PRBC #[id number]    Lalla Brothers, RN  05/17/2021, 09:34    [phone] - Type and cross drawn and sent to lab.    1217 - T 36.6, HR 73, RR 16, BP 135/75 (95), 94% RA.    1221 - 1 U PRBC dual signed-off with Melanee Savage, LPN. Consent on file. No previous reaction known.    1227 - Transfusion initiated. Writing RN at bedside with patient for 15 minutes. Pt educated on s/s of reaction and when to notify nursing staff.    Lalla Brothers, RN  05/17/2021, 12:31    1243 - VSS.    1248 - Rate change to 274 mL/hr to run rate at volume. Pt reeducated on when to notify staff of s/s of reaction.    Lalla Brothers, RN  05/17/2021, 12:52    1340 - Transfusion complete with no signs of reaction.    Lalla Brothers, RN  05/17/2021, 13:43

## 2021-05-17 NOTE — Consults (Signed)
[facility]  Endocrinology Consult  Follow Up Note    [name], [name], 76 y.o. male  Date of Service: 05/17/2021  Date of Birth:  09/02/1945    Hospital Day:  LOS: 44 days     Reason for Consult: : Diabetes management     [name] is a 76 y.o. 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      Subjective:   Patient seen at bedside , says he has been able to tolerate a regular diet , ate yogurt this morning and tolerated well .  He no longer feels nauseous and would like to eat lunch as well        Current Facility-Administered Medications   Medication Dose Route Frequency Provider Last Rate Last Admin   . acetaminophen (TYLENOL) tablet  500 mg Oral Q4H PRN Donna Christen, MD   500 mg at 05/11/21 0841   . adult custom variable rate parenteral nutrition   Intravenous Continuous Aggie Hacker, MD 110 mL/hr at 05/17/21 0754 Solution Verified at 05/17/21 0754   . atorvastatin (LIPITOR) tablet  40 mg Oral Daily with Breakfast Nicola Police, MD   40 mg at 05/17/21 0919   . benzonatate (TESSALON) capsule  100 mg Oral Q8H PRN [name], MD       . D5W 250 mL flush bag   Intravenous Q15 Min PRN Modena Jansky, DO       . DAPTOmycin (CUBICIN) 450 mg in NS 50 mL IVPB  6 mg/kg Intravenous Q24H Nobie Putnam, MD   Stopped at 05/17/21 [phone]   . elvitegravir-cobicistat-emtricitrabine-tenofovir (GENVOYA) 150-150-200-10 mg per tablet  1 Tablet Oral Daily Monia Pouch, DO   1 Tablet at 05/17/21 0919   . gabapentin (NEURONTIN) capsule  300 mg Oral 3x/day Mariea Clonts, MD   300 mg at 05/17/21 0919   . heparin 25,000 units in NS 250 mL infusion  18 Units/kg/hr (Adjusted) Intravenous Continuous Lucile Crater, MD 13.4  mL/hr at 05/17/21 0753 18 Units/kg/hr at 05/17/21 0753   . insulin NPH human 100 units/mL injection  20 Units Subcutaneous Daily before Breakfast Rose Phi, MD   20 Units at 05/17/21 0855   . insulin NPH human 100 units/mL injection  30 Units Subcutaneous Daily after Concha Norway, Zipporah Plants, MD   30 Units at 05/16/21 1831   . insulin R human (HUMULIN R) 100 units/mL injection - for tube feed coverage  16 Units Subcutaneous 3 times per day [name], Mehjabeen, MD       . melatonin tablet  6 mg Oral NIGHTLY Donna Christen, MD   6 mg at 05/16/21 2110   . meropenem (MERREM) 1 g in NS 100 mL IVPB  1 g Intravenous Jen Mow, MD   Stopped at 05/17/21 [phone]   . multivitamin-minerals-folic acid-lycopene-lutein (CERTAVITE SENIOR) tablet  1 Tablet Oral Q24H Cosner, Adam, PA-C   1 Tablet at 05/16/21 1355   . NS 250 mL flush bag   Intravenous Q15 Min PRN Modena Jansky, DO       . NS bolus infusion 40 mL  40 mL Intravenous Once PRN Donna Christen, MD       . NS flush syringe  10-30 mL Intracatheter Q8HRS Tish Frederickson  Elberta Fortis, MD   10 mL at 05/17/21 0600   . NS flush syringe  20-30 mL Intracatheter Q1 MIN PRN Nicola Police, MD       . NS flush syringe  2-6 mL Intracatheter Q8HRS Modena Jansky, DO   3 mL at 05/16/21 2200   . NS flush syringe  2-6 mL Intracatheter Q1 MIN PRN Modena Jansky, DO       . oxyCODONE (ROXICODONE) immediate release tablet  2.5 mg Oral Q4H PRN Stacey Drain, DO   2.5 mg at 05/10/21 1400   . oxyCODONE (ROXICODONE) immediate release tablet  5 mg Oral Q4H PRN Stacey Drain, DO   5 mg at 05/15/21 2006   . pantoprazole (PROTONIX) delayed release tablet  40 mg Oral Q12H [name], MD   40 mg at 05/17/21 0528   . potassium chloride 10 mEq in SW 100 mL premix infusion  10 mEq Intravenous Q1H Donna Christen, MD 100 mL/hr at 05/17/21 0907 10 mEq at 05/17/21 0907   . prochlorperazine (COMPAZINE) 5 mg/mL injection  10 mg Intravenous Q6H PRN Mariea Clonts, MD       .  promethazine [facility]) tablet  6.25 mg Oral Q6H PRN Lucile Crater, MD   6.25 mg at 05/16/21 2109   . sennosides-docusate sodium (SENOKOT-S) 8.6-'50mg'$  per tablet  1 Tablet Oral 2x/day Cosner, Adam, PA-C   1 Tablet at 05/16/21 2110   . SSIP insulin R human (HUMULIN R) 100 units/mL injection  0-12 Units Subcutaneous Q6H PRN Malena Peer, MD   6 Units at 05/17/21 0856     Objective:  Temperature: 36.8 C (98.2 F)  Heart Rate: 67  BP (Non-Invasive): 125/75  Respiratory Rate: 16  SpO2: 93 %     General: Chronically ill appearing male in no acute distress.  HEENT: Head normocephalic, atraumatic. Conjunctiva clear. No proptosis or lid lag. Oral mucosa pink and moist.   Thyroid/Neck: No thyromegaly. Trachea midline.  Lungs: Normal respiratory effort.   Cardiac: Regular rate and rhythm.  Abdomen: Soft, non-tender, non-distended. Ostomy in place   Extremities: No edema, erythema, warmth, or tenderness.  Skin: Warm and dry. No visible rashes.  Neuro: sleepy ,no obvious focal neurologic deficit   Psychiatric: Normal mood and affect.      Lab Results   Component Value Date    WBC 26.0 (H) 05/17/2021    RBC 2.53 (L) 05/17/2021    HGB 6.7 (LL) 05/17/2021    HCT 22.2 (L) 05/17/2021    MCV 87.7 05/17/2021    MCH 26.5 05/17/2021    MCHC 30.2 (L) 05/17/2021     Lab Results   Component Value Date    SODIUM 142 05/17/2021    POTASSIUM 3.2 (L) 05/17/2021    CO2 25 05/17/2021    BUN 28 (H) 05/17/2021    CREATININE 0.65 (L) 05/17/2021    GFR >90 05/17/2021    CALCIUM 8.3 (L) 05/17/2021    ALBUMIN 2.0 (L) 05/10/2021    ALKPHOS 80 05/17/2021    ALT 27 05/17/2021    AST 15 05/17/2021    ANIONGAP 6 05/17/2021     Lab Results   Component Value Date    CHOLESTEROL 212 (H) 12/21/2020    HDLCHOL 44 12/21/2020    LDLCHOL 96 12/21/2020    LDLCHOLDIR 89 03/24/2020    TRIG 132 05/17/2021      Lab Results   Component Value Date    HA1C 9.3 (H) 03/17/2021  Recent Labs     05/15/21  1110 05/15/21  1810 05/15/21  2004 05/16/21  0202  05/16/21  0842 05/16/21  1212 05/16/21  1820 05/16/21  2054 05/17/21  0210 05/17/21  0805   GLUIP 242* 220* 233* 261* 381* 379* 234* 191* 265* 272*       Assessment/Recommendations:  [name] is a 76 y.o. 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 Endocrinology is consulted for management of diabetes.     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  - Home regimen: Lantus 11 units nightly, Novolog 4 units TID AC.   - Current inpatient regimen:   Insulin NPH 20/30   units bid, Humulin R 12    units Q6 hours, scheduled with TPN  and Humulin R conservative  sliding scale 4x/day PRN.   -Diet: MNT PROTOCOL FOR DIETITIAN  adult custom variable rate parenteral nutrition  DIET REGULAR   Currently on TPN to run for 18 hrs (8 pm to 2 pm next day )     Patient has been hyperglycemic in 200's from effect of decadron however also because he has been eating better now in addition to TPN       - Recommendations:  Will increase the insulin R to 14 units given with TPN ( 8pm , 2 am and 8 am )   Continue with insulin NPH 20 /30 units bid   Continue with insulin R every 6 hrs per conservative scale   Will acces his meal time sugars especially with lunch and dinner time, and might add small dose of insulin R with dinner time ( since he does not get any scheduled insulin at dinner time )    Will access Insulin requirements and make changes.       Thank you for this consult. Will continue to follow. Please page with questions.    [name]  Endocrine fellow    05/17/2021, 09:35  Department of Endocrine, Diabetes and metabolism               05/17/2021  I saw and examined the patient.   I reviewed the fellow's note.  I agree with the findings and plan of care as documented in the fellow's note.  Any exceptions/additions are edited/noted.    Venancio Poisson, MD 05/17/2021, 20:41  Assistant Professor  Endocrinology & Metabolism  [facility]

## 2021-05-17 NOTE — Care Management Notes (Signed)
[facility]  Care Management Note    Patient Name: [name]  Date of Birth: 1945-02-05  Sex: male  Date/Time of Admission: 04/03/2021  2:01 PM  Room/Bed: 961/A  Payor: [facility] [employer] [facility] / Plan: Jolayne Panther VACCN/OPTUM / Product Type: Managed Care /    LOS: 44 days   Primary Care Providers:  [place] (General)    Admitting Diagnosis:  Intra abdominal hemorrhage [R58]    Assessment:      05/17/21 1154   Assessment Details   Assessment Type Continued Assessment   Date of Care Management Update 05/17/21   Date of Next DCP Update 05/20/21   Care Management Plan   Discharge Planning Status plan in progress   Projected Discharge Date 05/19/21   Discharge plan discussed with: Patient   CM will evaluate for rehabilitation potential yes   Patient choice offered to patient/family no   Discharge Needs Assessment   Discharge Facility/Level of Care Needs Acute Rehab Placement/Return (not psych)(code 62)       Per service, pt may possibly be d/c ready for [facility] later this week.  [facility] following for possible admission.      Discharge Plan:  Acute Rehab Placement/Return (not psych) (code 80)      The patient will continue to be evaluated for developing discharge needs.     Case Manager: Joella Prince, [place]  Phone: [phone]

## 2021-05-17 NOTE — Care Plan (Signed)
Occupational Therapy Note     05/17/21 1015   Therapist Pager   OT Assigned/ Pager # Pascuala Klutts [phone]   Rehab Session   Document Type rehab contact note   Total OT Minutes: 0   Daily Activity AM-PAC/6-clicks Score   Patient Mobility Barrier Patient unstable or too ill to participate  (Pt with Hgb level of 6.7 this AM)     Will follow up with OT intervention at a later time as pt is available and appropriate to participate.    Yarelli Decelles, MOT, OTR/L  Pager [phone]

## 2021-05-17 NOTE — Care Plan (Signed)
Medical Nutrition Therapy Follow Up        SUBJECTIVE : visited pt this morning, he reports he's been tolerating FL diet, (diet advanced to regular but hadn't received a tray yet), says he drinks vanilla ensure HP at home and receptive to drink it     OBJECTIVE:     Current Diet Order/Nutrition Support: Regular diet plus TPN   TPN  Cycled for 18 hours at night (55 ml the first and last hour and 110 ml x 16 hours)   2100 kcal /day = 28 kcal/kg  100 grams Protein = 1.4 grams/ kg  860 Dextrose kcals, GIR 2.3  840 IL kcals or 1.1 grams Fat/kg (40 % of total calories)       Height Used for Calculations: 175.3 cm (5' 9.02")  Weight Used For Calculations: 75.9 kg (167 lb 5.3 oz) (standing weight on 3/3)  Weight trends: 3/14- 84.4 kg (bed) ; 3/23- 84.4 kg (no edema noted on flow sheet); 3/24- 79.5 kg ;4/3- 71.8 kg (bed) ; 4/8- 72.1 kg ; 4/17- no recent weights to assess   BMI (kg/m2): 24.75  BMI Assessment: BMI 18.5-24.9: normal  Ideal Body Weight (IBW) (kg): 73.73  % Ideal Body Weight: 102.94     Usual Body Weight: 81.3 kg (179 lb 3.7 oz)  Weight Loss:  (intentional weight loss)      Estimated Needs:    Energy Calorie Requirements: 2100-2300 cal (28-30 cal/75.9 kg)  Protein Requirements (gms/day): 88-110 g prot (1.2-1.5 g/73.7 kg)   Fluid Requirements: 2100-2300 ml (28-30 ml/75.9 k)       Comments: 76 y.o. 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 He underwent massive transfusion protocol and ex lap on 04/03/21 at an outside facility with ligation of a bleeding vessel in the liver bed. Patient was subsequently transferred to [facility] postoperaively, intubated. Patient was subsequently extubated later that day on 3/4. 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     Endocrinology following for diabetes management           Latest Reference Range &  Units 03/17/21 09:17   HEMOGLOBIN A1C <5.7 % 9.3 (H)      Latest Reference Range & Units 05/17/21 04:19   TRIGLYCERIDES <150 mg/dL 132      Latest Reference Range & Units 05/16/21 12:12 05/16/21 18:20 05/16/21 20:54 05/17/21 02:10 05/17/21 08:05   GLUCOSE, POC 70 - 105 mg/dl 379 (H) 234 (H) 191 (H) 265 (H) 272 (H)   (H): Data is abnormally high      Plan/Interventions :   Will change diet to 2200 ADA diet  Supplement with Ensure HP TID (vanilla per pt preference)     Continue oral multivitamin/mineral     Per primary service to continue current TPN at this time    Suggest weaning TPN if pt shows tolerance to regular diet, suspect BS will improve once off TPN     Continue to Monitor potassium, magnesium and phosphorus daily along with blood sugars and BMP and electrolytes can be adjusted in TPN as needed    Continue to Monitor LFTs and triglycerides weekly.   Monitor weekly weights.     Will continue to follow.     Nutrition Diagnosis: Altered GI function related to midline fistula draining stool as evidenced by need for TPN: in progress but  starting to improve     Dierdre Searles, RD, LD, CNSC 05/17/2021, 11:40  Pager [phone]

## 2021-05-18 ENCOUNTER — Inpatient Hospital Stay (HOSPITAL_COMMUNITY): Payer: 59 | Admitting: Radiology

## 2021-05-18 DIAGNOSIS — J9811 Atelectasis: Secondary | ICD-10-CM

## 2021-05-18 DIAGNOSIS — N3289 Other specified disorders of bladder: Secondary | ICD-10-CM

## 2021-05-18 DIAGNOSIS — J90 Pleural effusion, not elsewhere classified: Secondary | ICD-10-CM

## 2021-05-18 DIAGNOSIS — Z9889 Other specified postprocedural states: Secondary | ICD-10-CM

## 2021-05-18 DIAGNOSIS — T819XXA Unspecified complication of procedure, initial encounter: Secondary | ICD-10-CM

## 2021-05-18 LAB — PTT (PARTIAL THROMBOPLASTIN TIME)
APTT: 32.1 seconds (ref 24.2–37.5)
APTT: 52.8 seconds — ABNORMAL HIGH (ref 24.2–37.5)
APTT: 34.4 seconds (ref 24.2–37.5)

## 2021-05-18 LAB — BPAM PACKED CELL ORDER: UNIT DIVISION: 0

## 2021-05-18 LAB — CBC WITH DIFF
BASOPHIL #: <0.1 10*3/uL (ref <=0.20)
BASOPHIL %: 0 %
EOSINOPHIL #: <0.1 10*3/uL (ref <=0.50)
EOSINOPHIL %: 0 %
HCT: 31.4 % — ABNORMAL LOW (ref 38.9–52.0)
HGB: 9.8 g/dL — ABNORMAL LOW (ref 13.4–17.5)
IMMATURE GRANULOCYTE #: 0.55 10*3/uL — ABNORMAL HIGH (ref <0.10)
IMMATURE GRANULOCYTE %: 3 % — ABNORMAL HIGH (ref 0–1)
LYMPHOCYTE #: 4.14 10*3/uL (ref 1.00–4.80)
LYMPHOCYTE %: 19 %
MCH: 27.5 pg (ref 26.0–32.0)
MCHC: 31.2 g/dL (ref 31.0–35.5)
MCV: 88.2 fL (ref 78.0–100.0)
MONOCYTE #: 1.8 10*3/uL — ABNORMAL HIGH (ref 0.20–1.10)
MONOCYTE %: 8 %
MPV: 12.6 fL — ABNORMAL HIGH (ref 8.7–12.5)
NEUTROPHIL #: 15.22 10*3/uL — ABNORMAL HIGH (ref 1.50–7.70)
NEUTROPHIL %: 70 %
PLATELETS: 538 10*3/uL — ABNORMAL HIGH (ref 150–400)
RBC: 3.56 10*6/uL — ABNORMAL LOW (ref 4.50–6.10)
RDW-CV: 17.2 % — ABNORMAL HIGH (ref 11.5–15.5)
WBC: 21.8 10*3/uL — ABNORMAL HIGH (ref 3.7–11.0)

## 2021-05-18 LAB — BASIC METABOLIC PANEL
ANION GAP: 7 mmol/L (ref 4–13)
BUN/CREA RATIO: 38 — ABNORMAL HIGH (ref 6–22)
BUN: 29 mg/dL — ABNORMAL HIGH (ref 8–25)
CALCIUM: 10 mg/dL (ref 8.8–10.2)
CHLORIDE: 106 mmol/L (ref 96–111)
CO2 TOTAL: 26 mmol/L (ref 23–31)
CREATININE: 0.76 mg/dL (ref 0.75–1.35)
ESTIMATED GFR: >90 mL/min/BSA (ref >=60)
GLUCOSE: 169 mg/dL — ABNORMAL HIGH (ref 65–125)
POTASSIUM: 4.3 mmol/L (ref 3.5–5.1)
SODIUM: 139 mmol/L (ref 136–145)

## 2021-05-18 LAB — POC BLOOD GLUCOSE (RESULTS)
GLUCOSE, POC: 127 mg/dl — ABNORMAL HIGH (ref 70–105)
GLUCOSE, POC: 157 mg/dl — ABNORMAL HIGH (ref 70–105)
GLUCOSE, POC: 182 mg/dl — ABNORMAL HIGH (ref 70–105)
GLUCOSE, POC: 197 mg/dl — ABNORMAL HIGH (ref 70–105)
GLUCOSE, POC: 203 mg/dl — ABNORMAL HIGH (ref 70–105)
GLUCOSE, POC: 88 mg/dl (ref 70–105)

## 2021-05-18 LAB — PHOSPHORUS: PHOSPHORUS: 2.7 mg/dL (ref 2.3–4.0)

## 2021-05-18 LAB — TYPE AND CROSS RED CELLS - UNITS
ANTIBODY SCREEN: NEGATIVE
UNITS ORDERED: 1

## 2021-05-18 LAB — MAGNESIUM: MAGNESIUM: 2.1 mg/dL (ref 1.8–2.6)

## 2021-05-18 MED ORDER — HEPARIN (PORCINE) 5,000 UNITS/ML BOLUS FOR DOSE ADJUSTMENT
25.0000 [IU]/kg | INTRAMUSCULAR | Status: AC
Start: 2021-05-18 — End: 2021-05-18
  Administered 2021-05-18: 2000 [IU] via INTRAVENOUS
  Filled 2021-05-18: qty 1

## 2021-05-18 MED ORDER — WATER FOR INJECTION, STERILE INTRAVENOUS SOLUTION
INTRAVENOUS | Status: AC
Start: 2021-05-18 — End: 2021-05-19
  Filled 2021-05-18: qty 1000

## 2021-05-18 MED ORDER — INSULIN NPH ISOPHANE U-100 HUMAN 100 UNIT/ML SUBCUTANEOUS SUSPENSION
30.0000 [IU] | Freq: Every evening | SUBCUTANEOUS | Status: DC
  Administered 2021-05-18 – 2021-05-19 (×2): 30 [IU] via SUBCUTANEOUS
  Administered 2021-05-20: 0 [IU] via SUBCUTANEOUS

## 2021-05-18 MED ORDER — LORAZEPAM 0.5 MG TABLET
0.5000 mg | ORAL_TABLET | Freq: Three times a day (TID) | ORAL | Status: DC | PRN
Start: 2021-05-18 — End: 2021-05-19

## 2021-05-18 MED ORDER — DIATRIZOATE MEGLUMINE-DIATRIZOATE SODIUM 66 %-10 % ORAL SOLUTION
8.0000 mL | ORAL | Status: DC
Start: 2021-05-18 — End: 2021-05-28

## 2021-05-18 MED ORDER — INSULIN REGULAR HUMAN 100 UNIT/ML INJECTION - FOR TUBE FEED
10.0000 [IU] | Freq: Three times a day (TID) | INTRAMUSCULAR | Status: DC
Start: 2021-05-18 — End: 2021-05-19
  Administered 2021-05-18: 0 [IU] via SUBCUTANEOUS
  Administered 2021-05-19: 10 [IU] via SUBCUTANEOUS

## 2021-05-18 MED ORDER — INSULIN REGULAR HUMAN 100 UNIT/ML INJECTION - FOR TUBE FEED
12.0000 [IU] | Freq: Three times a day (TID) | INTRAMUSCULAR | Status: DC
Start: 2021-05-18 — End: 2021-05-18

## 2021-05-18 MED ORDER — SODIUM CHLORIDE 0.9 % INTRAVENOUS PIGGYBACK
1.0000 g | Freq: Three times a day (TID) | INTRAVENOUS | Status: DC
Start: 2021-05-18 — End: 2021-05-28
  Administered 2021-05-18: 1 g via INTRAVENOUS
  Administered 2021-05-18: 0 g via INTRAVENOUS
  Administered 2021-05-18: 1 g via INTRAVENOUS
  Administered 2021-05-19 (×2): 0 g via INTRAVENOUS
  Administered 2021-05-19: 1 g via INTRAVENOUS
  Administered 2021-05-19: 0 g via INTRAVENOUS
  Administered 2021-05-19: 1 g via INTRAVENOUS
  Administered 2021-05-19: 0 g via INTRAVENOUS
  Administered 2021-05-19: 1 g via INTRAVENOUS
  Administered 2021-05-20: 0 g via INTRAVENOUS
  Administered 2021-05-20 (×2): 1 g via INTRAVENOUS
  Administered 2021-05-20 (×2): 0 g via INTRAVENOUS
  Administered 2021-05-21: 1 g via INTRAVENOUS
  Administered 2021-05-21: 0 g via INTRAVENOUS
  Administered 2021-05-21: 1 g via INTRAVENOUS
  Administered 2021-05-21: 0 g via INTRAVENOUS
  Administered 2021-05-21: 1 g via INTRAVENOUS
  Administered 2021-05-22 (×2): 0 g via INTRAVENOUS
  Administered 2021-05-22: 1 g via INTRAVENOUS
  Administered 2021-05-22: 0 g via INTRAVENOUS
  Administered 2021-05-22: 1 g via INTRAVENOUS
  Administered 2021-05-22: 0 g via INTRAVENOUS
  Administered 2021-05-22 – 2021-05-23 (×2): 1 g via INTRAVENOUS
  Administered 2021-05-23: 0 g via INTRAVENOUS
  Administered 2021-05-23 (×2): 1 g via INTRAVENOUS
  Administered 2021-05-23 – 2021-05-24 (×2): 0 g via INTRAVENOUS
  Administered 2021-05-24: 1 g via INTRAVENOUS
  Administered 2021-05-24: 0 g via INTRAVENOUS
  Administered 2021-05-24 (×2): 1 g via INTRAVENOUS
  Administered 2021-05-24: 0 g via INTRAVENOUS
  Administered 2021-05-25: 1 g via INTRAVENOUS
  Administered 2021-05-25: 0 g via INTRAVENOUS
  Administered 2021-05-25: 1 g via INTRAVENOUS
  Administered 2021-05-25 (×3): 0 g via INTRAVENOUS
  Administered 2021-05-25 – 2021-05-26 (×3): 1 g via INTRAVENOUS
  Administered 2021-05-26: 0 g via INTRAVENOUS
  Administered 2021-05-26: 1 g via INTRAVENOUS
  Administered 2021-05-26 – 2021-05-27 (×4): 0 g via INTRAVENOUS
  Administered 2021-05-27: 1 g via INTRAVENOUS
  Administered 2021-05-27: 0 g via INTRAVENOUS
  Administered 2021-05-27 – 2021-05-28 (×4): 1 g via INTRAVENOUS
  Administered 2021-05-28: 0 g via INTRAVENOUS
  Filled 2021-05-18 (×31): qty 20

## 2021-05-18 MED ORDER — ALTEPLASE 1 MG/2 ML SYRINGE
1.0000 mg | INJECTION | INTRAMUSCULAR | Status: AC
Start: 2021-05-18 — End: 2021-05-18
  Administered 2021-05-18: 1 mg
  Filled 2021-05-18: qty 2

## 2021-05-18 MED ORDER — INSULIN NPH ISOPHANE U-100 HUMAN 100 UNIT/ML SUBCUTANEOUS SUSPENSION
10.0000 [IU] | Freq: Every morning | SUBCUTANEOUS | Status: DC
  Administered 2021-05-18 – 2021-05-19 (×2): 10 [IU] via SUBCUTANEOUS
  Filled 2021-05-18: qty 1000

## 2021-05-18 MED ORDER — IOPAMIDOL 370 MG IODINE/ML (76 %) INTRAVENOUS SOLUTION
78.0000 mL | INTRAVENOUS | Status: AC
Start: 2021-05-18 — End: 2021-05-18
  Administered 2021-05-18: 78 mL via INTRAVENOUS

## 2021-05-18 NOTE — Care Plan (Signed)
[facility]  Physical Therapy Progress Note      Patient Name: [name]  Date of Birth: July 09, 1945  Height:  175.3 cm (5' 9.02")  Weight:  72.1 kg (158 lb 15.2 oz)  Room/Bed: 961/A  Payor: [facility] [employer] [facility] / Plan: [place] VACCN/OPTUM / Product Type: Managed Care /     Assessment:     Mr. Fell was still fatigued and weary/flat in affect. He was assisted back to bed via stand pivot. He continues to demonstrate generalized weakness, decreased activity tol, impaired balance, and impaired mobility. His mobility is not currently adequate for safe d/c to home but he has good rehab potential. Rec IRF at d/c.    Discharge Needs:   Equipment Recommendation: TBD  Discharge Disposition: inpatient rehabilitation facility    JUSTIFICATION OF DISCHARGE RECOMMENDATION   Based on current diagnosis, functional performance prior to admission, and current functional performance, this patient requires continued PT services in inpatient rehabilitation facility in order to achieve significant functional improvements in these deficit areas: aerobic capacity/endurance, gait, locomotion, and balance, muscle performance.      Plan:   Continue to follow patient according to established plan of care.  The risks/benefits of therapy have been discussed with the patient/caregiver and he/she is in agreement with the established plan of care.     Subjective & Objective:        05/18/21 1306   Therapist Pager   PT Assigned/ Pager # [name] [phone]   Rehab Session   Document Type therapy progress note (daily note)   Total PT Minutes: 5   Patient Effort adequate   Symptoms Noted During/After Treatment fatigue   General Information   Patient Profile Reviewed yes   Patient/Family/Caregiver Comments/Observations Pt was asleep in chair with his head hanging down in front of him. PT returned to b/s to assist pt back to bed after 30 minutes as promised.   Medical Lines PICC Line;PIV Line;Peripheral  Drain;TPN   Respiratory Status room air   Existing Precautions/Restrictions contact isolation;full code;fall precautions   General Observations of Patient Pt was seen at b/s for assist back to bed as promised when he agreed to stay up in chair for 30 minutes.   Mutuality/Individual Preferences   Individualized Care Needs OOB with FWW and assist x 2   Pre Treatment Status   Pre Treatment Patient Status Patient sitting in bedside chair or w/c;Call light within reach;Telephone within reach   Support Present Pre Treatment  Family present   Cognitive Assessment/Interventions   Behavior/Mood Observations cooperative;flat affect;impaired task initiation  (weary)   Attention mild impairment   Follows Commands follows two step commands   Pain Assessment   Pretreatment Pain Rating 0/10 - no pain   Posttreatment Pain Rating 0/10 - no pain   Mobility Assessment/Training   Comment Pt was assisted with sit to stand pivot chair to bed then sit to supine.   Bed Mobility Assessment/Treatment   Bed Mobility, Assistive Device Head of Bed Elevated   Safety Issues decreased use of arms for pushing/pulling;decreased use of legs for bridging/pushing;impaired trunk control for bed mobility   Impairments balance impaired;strength decreased   Comment Pt needed verbal cues for sequencing and mod A to lift his legs back into bed.   Sit to Sidelying, Independence moderate assist (50% patient effort);verbal cues required   Transfer Assessment/Treatment   Sit-Stand Independence moderate assist (50% patient effort);verbal cues required   Stand-Sit Independence minimum assist (75% patient effort);verbal  cues required   Sit-Stand-Sit, Assist Device handheld assist  (bear  hug)   Chair-Bed Independence minimum assist (75% patient effort)   Bed-Chair-Bed Assist Device handheld assist  (bear hug)   Transfer Safety Issues balance decreased during turns;step length decreased;weight-shifting ability decreased;sequencing ability decreased;loses balance  backward   Transfer Impairments balance impaired;coordination impaired;endurance;postural control impaired;strength decreased   Transfer Comment Pt completed a sit to stand pivot back to bed. Limited step height and length with shuffling steps laterally to bed.   Balance Skill Training   Comment with UE support in standing   Sitting Balance: Static fair balance   Sitting, Dynamic (Balance) fair - balance   Sit-to-Stand Balance poor balance   Standing Balance: Static poor + balance   Standing Balance: Dynamic poor balance   Systems Impairment Contributing to Balance Disturbance musculoskeletal   Identified Impairments Contributing to Balance Disturbance impaired coordination;impaired postural control;decreased strength   Post Treatment Status   Post Treatment Patient Status Patient supine in bed;Call light within reach;Telephone within reach;Sitter select activated   Support Present Post Treatment  Family present   Environmental manager   Plan of Care Review   Plan Of Care Reviewed With patient;spouse   Basic Mobility Am-PAC/6Clicks Score (APPROVED Staff)   Turning in bed without bedrails 3   Lying on back to sitting on edge of flat bed 3   Moving to and from a bed to a chair 2   Standing up from chair 2   Walk in room 3   Climbing 3-5 steps with railing 1   6 Clicks Raw Score total 14   Standardized (t-scale) score 35.55   CMS 0-100% Score 53.86   CMS Modifier CK   Patient Mobility Goal (JHHLM) 4- Move to chair 3X/day   Exercise/Activity Level Performed 4- Transferred to chair/commode   Physical Therapy Clinical Impression   Assessment Mr. Mokry was still fatigued and weary/flat in affect. He was assisted back to bed via stand pivot. He continues to demonstrate generalized weakness, decreased activity tol, impaired balance, and impaired mobility. His mobility is not currently adequate for safe d/c to home but he has good rehab potential. Rec IRF at d/c.   Anticipated Equipment Needs at Discharge  (PT) TBD   Anticipated Discharge Disposition inpatient rehabilitation facility         Therapist:   Norlene Duel, PT   Pager #: [phone]

## 2021-05-18 NOTE — Progress Notes (Signed)
[facility]  Surgical Oncology  Progress Note      [name], [name], 76 y.o. male  Date of Birth:  03/04/1945  Date of Admission:  04/03/2021  Date of service: 05/18/2021    Assessment:  This is a 76 y.o. 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 He underwent massive transfusion protocol and ex lap on 04/03/21 at an outside facility with ligation of a bleeding vessel in the liver bed. Patient was subsequently transferred to [facility] postoperaively, intubated. Patient was subsequently extubated later that day on 3/4. 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 He failed advancing his diet to fulls the week of 4/08, backed off to TPN / clears after he developed severe nausea.    Plan/Recommendations:  - CT A/P with IV and oral contrast today - ordered  - Positive blood culture    - One of two bottles positive for GPCs   - Repeat blood cultures pending   - Continue antibiotics at this time, deescalate when able   - Appreciate ID recs  - Nausea:   QTc prolonged at 480   Keep PRN Zofran & PRN Phenergan PO  - Continue heparin gtt  - Diet: Diabetic diet as tolerated  - Peripancreatic fluid collections   - Advanced GI performed EUS with cystogastrostomy 4/6              - Blood cultures 3/9: + pseudomonas    - Cultures form R JP: Pseudomonas and enterococcus              - ID consulted; Daptomycin and meropenum (completed 4/10)  - PE   - s/p IVC filter placement 3/17  - Midline fistula with ostomy bag to monitor output  - OOB TID, encourage ambulation  - Pulmonary toilet, keep IS accessible at bedside  - PT/OT ordered, encourage pt to work with therapy  - Dispo: Floor      Subjective:   Pt seen in NAD.  Tolerated his CT contrast without issue. Pt denies CP, SOB, n/v, f/c. No complaints today.     Objective  Filed Vitals:    05/17/21 1600 05/17/21 1921 05/18/21 0013 05/18/21 0415   BP: 139/84 (!) 155/89 (!) 145/68 138/71   Pulse: 77 88 81 81   Resp: '18 16 16 16   '$ Temp: 37.2 C (99 F) 37 C (98.6 F) 37 C (98.6 F) 36.8 C (98.2 F)   SpO2: 95% 93% 92% 91%     Physical Exam:   GEN:  AOx4, resting in bed, no acute distress  PULM: Normal respiratory effort. Equal/symmetric chest rise.  CV:  Pink, well perfused, RR  ABD:   Abdomen soft, nontender, nondistended. Lower midline incision with Eakins pouch with thin tan stool  MS: Atraumatic. Moves all extremities.  NEURO:   Alert and oriented to person, place and time.  Cranial nerves grossly intact.    Integumentary:  Pink, warm, and dry  PSYCHOSOCIAL: Pleasant.  Normal affect.     Labs  Results for orders placed or performed during the hospital encounter of 04/03/21 (from the past 24 hour(s))   PROCALCITONIN REFLEX 6HR   Result Value Ref Range    PROCALCITONIN 0.11 <0.50  ng/mL    Narrative    Interpretation Guidelines    Diagnosis/management of bacterial lower respiratory tract infection (LRTI)     <0.25 ng/mL - Antibiotic therapy discouraged     0.25-0.5 ng/mL - Antibiotic therapy recommended     >0.5 ng/mL - Antibiotic therapy strongly recommended    Diagnosis/management of systemic bacterial infection/sepsis:     <0.5 ng/mL - Antibiotic therapy discouraged     0.5-2.0 ng/mL - Antibiotic therapy recommended     >2.0 ng/mL - Antibiotic therapy strongly recommended    Procalcitonin results must be interpreted in the context of patient's clinical status. Follow-up testing to assess value trends should be considered for patients with evolving clinical course and/or for re-evaluation of patient management/response to therapy. Please order HelpLab ([id number]) requesting a "single procalcitonin level" for additional individual values, as clinically indicated.    Trauma,  non-infectious systemic inflammation, active autoimmunity, and recent surgery can cause elevated PCT results. Newborns can also have elevated PCT results during the first 72 hours of life.   BASIC METABOLIC PANEL   Result Value Ref Range    SODIUM 139 136 - 145 mmol/L    POTASSIUM 4.3 3.5 - 5.1 mmol/L    CHLORIDE 106 96 - 111 mmol/L    CO2 TOTAL 26 23 - 31 mmol/L    ANION GAP 7 4 - 13 mmol/L    CALCIUM 10.0 8.8 - 10.2 mg/dL    GLUCOSE 169 (H) 65 - 125 mg/dL    BUN 29 (H) 8 - 25 mg/dL    CREATININE 0.76 0.75 - 1.35 mg/dL    BUN/CREA RATIO 38 (H) 6 - 22    ESTIMATED GFR >90 >=60 mL/min/BSA    Narrative    Hemolysis can alter results at this level (slight).  Lipemia can alter results at this level (slight).   PHOSPHORUS   Result Value Ref Range    PHOSPHORUS 2.7 2.3 - 4.0 mg/dL    Narrative    Hemolysis can alter results at this level (slight).   MAGNESIUM   Result Value Ref Range    MAGNESIUM 2.1 1.8 - 2.6 mg/dL    Narrative    Hemolysis can alter results at this level (slight).  Lipemia can alter results at this level (slight).   CBC/DIFF    Narrative    The following orders were created for panel order CBC/DIFF.  Procedure                               Abnormality         Status                     ---------                               -----------         ------                     CBC WITH YPPJ[[id number]]                Abnormal            Final result                 Please view results for these tests on the individual orders.   CBC WITH DIFF   Result Value Ref  Range    WBC 21.8 (H) 3.7 - 11.0 x10^3/uL    RBC 3.56 (L) 4.50 - 6.10 x10^6/uL    HGB 9.8 (L) 13.4 - 17.5 g/dL    HCT 31.4 (L) 38.9 - 52.0 %    MCV 88.2 78.0 - 100.0 fL    MCH 27.5 26.0 - 32.0 pg    MCHC 31.2 31.0 - 35.5 g/dL    RDW-CV 17.2 (H) 11.5 - 15.5 %    PLATELETS 538 (H) 150 - 400 x10^3/uL    MPV 12.6 (H) 8.7 - 12.5 fL    NEUTROPHIL % 70 %    LYMPHOCYTE % 19 %    MONOCYTE % 8 %    EOSINOPHIL % 0 %    BASOPHIL % 0 %    NEUTROPHIL # 15.22 (H) 1.50  - 7.70 x10^3/uL    LYMPHOCYTE # 4.14 1.00 - 4.80 x10^3/uL    MONOCYTE # 1.80 (H) 0.20 - 1.10 x10^3/uL    EOSINOPHIL # <0.10 <=0.50 x10^3/uL    BASOPHIL # <0.10 <=0.20 x10^3/uL    IMMATURE GRANULOCYTE % 3 (H) 0 - 1 %    IMMATURE GRANULOCYTE # 0.55 (H) <0.10 x10^3/uL   PTT (PARTIAL THROMBOPLASTIN TIME)   Result Value Ref Range    APTT 32.1 24.2 - 37.5 seconds    Narrative    Therapeutic range for unfractionated heparin is 60-100 seconds.   TYPE AND CROSS RED CELLS - UNITS , 1 Units   Result Value Ref Range    UNITS ORDERED 1     SPECIMEN EXPIRATION DATE 05/20/2021,2359     ABO/RH(D) A NEGATIVE     ANTIBODY SCREEN NEGATIVE    BPAM PACKED CELL ORDER   Result Value Ref Range    Coding System [id number]     UNIT NUMBER [id number]     BLOOD COMPONENT TYPE LR RBC, Adsol3, 04761     UNIT DIVISION 00     UNIT DISPENSE STATUS ISSUED,FINAL     TRANSFUSION STATUS OK TO TRANSFUSE     IS CROSSMATCH Electronically Compatible     Product Code [id number]    POC BLOOD GLUCOSE (RESULTS)   Result Value Ref Range    GLUCOSE, POC 272 (H) 70 - 105 mg/dl   POC BLOOD GLUCOSE (RESULTS)   Result Value Ref Range    GLUCOSE, POC 144 (H) 70 - 105 mg/dl   POC BLOOD GLUCOSE (RESULTS)   Result Value Ref Range    GLUCOSE, POC 149 (H) 70 - 105 mg/dl   POC BLOOD GLUCOSE (RESULTS)   Result Value Ref Range    GLUCOSE, POC 203 (H) 70 - 105 mg/dl   POC BLOOD GLUCOSE (RESULTS)   Result Value Ref Range    GLUCOSE, POC 197 (H) 70 - 105 mg/dl   POC BLOOD GLUCOSE (RESULTS)   Result Value Ref Range    GLUCOSE, POC 88 70 - 105 mg/dl       I/O:  Date 05/17/21 0700 - 05/18/21 0659 05/18/21 0700 - 05/19/21 0659   Shift 0700-1459 1500-2259 2300-0659 24 Hour Total 0700-1459 1500-2259 2300-0659 24 Hour Total   INTAKE   P.O. 480 260  740         Oral 480 260  740       I.V.(mL/kg/hr) 450.38(8.82)   713.17(0.41)         Volume Infused (adult custom variable rate parenteral nutrition) 713.17   713.17       Blood 274   274  Volume (mL) (NURSING--TRANSFUSE RED BLOOD  CELLS - UNITS) 274   274       IV Piggyback 420 120 64 604         Volume (meropenem (MERREM) 1 g in NS 100 mL IVPB) 120 120  240         Volume (potassium chloride 10 mEq in SW 100 mL premix infusion) 300   300         Volume (DAPTOmycin (CUBICIN) 700 mg in NS 50 mL IVPB)   64 64       Shift Total(mL/kg) 5597.41(63.84) 380(5.27) 64(0.89) 5364.68(03.21)       OUTPUT   Urine(mL/kg/hr) 1675(2.9) 600(1.04)  2248(2.50)         Urine (Voided) 1675 600  2275         Urine Occurrence   2 x 2 x       Stool             Stool Occurrence 4 x   4 x       Shift Total(mL/kg) 0370(48.88) 916(9.45)  0388(82.80)       Weight (kg) 72.1 72.1 72.1 72.1 72.1 72.1 72.1 72.1       Radiology:  05/10/21 CT C/A/P   1.Postsurgical changes from distal pancreatectomy and splenectomy with persistent fluid collections along the surgical beds with two percutaneous drainage catheters noted. The fluid collection most closely approximating the pancreatic body shows marked interval decreased size status post stent placements, while the other collections similar to minimally decreased in size. No new organized intra-abdominal collections are identified.  2.Midline abdominal wall incision with underlying fluid and inflammatory changes are also similar to prior.   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 However, there is redemonstration of closely approximating transverse colon to the anterior abdominal wall.  4.Small left-sided pleural effusion with associated passive atelectasis.  5.Small volume left upper quadrant intra-abdominal free fluid and diffuse mesenteric stranding throughout.     Current Medications:  Current Facility-Administered Medications   Medication Dose Route Frequency   . acetaminophen (TYLENOL) tablet  500 mg Oral Q4H PRN   . adult custom variable rate parenteral nutrition   Intravenous Continuous   .  atorvastatin (LIPITOR) tablet  40 mg Oral Daily with Breakfast   . benzonatate (TESSALON) capsule  100 mg Oral Q8H PRN   . D5W 250 mL flush bag   Intravenous Q15 Min PRN   . DAPTOmycin (CUBICIN) 700 mg in NS 50 mL IVPB  10 mg/kg Intravenous Q24H   . elvitegravir-cobicistat-emtricitrabine-tenofovir (GENVOYA) 150-150-200-10 mg per tablet  1 Tablet Oral Daily   . gabapentin (NEURONTIN) capsule  300 mg Oral 3x/day   . heparin 25,000 units in NS 250 mL infusion  18 Units/kg/hr (Adjusted) Intravenous Continuous   . insulin NPH human 100 units/mL injection  20 Units Subcutaneous Daily before Breakfast   . insulin NPH human 100 units/mL injection  30 Units Subcutaneous Daily after Dinner   . insulin R human (HUMULIN R) 100 units/mL injection - for tube feed coverage  14 Units Subcutaneous 3x/day   . LORazepam (ATIVAN) 2 mg/mL injection  0.5 mg Intravenous Q8H PRN   . melatonin tablet  6 mg Oral NIGHTLY   . meropenem (MERREM) 1 g in NS 100 mL IVPB  1 g Intravenous Q8H   . mirtazapine (REMERON) tablet  7.5 mg Oral NIGHTLY   . multivitamin-minerals-folic acid-lycopene-lutein (CERTAVITE  SENIOR) tablet  1 Tablet Oral Q24H   . NS 250 mL flush bag   Intravenous Q15 Min PRN   . NS flush syringe  10-30 mL Intracatheter Q8HRS   . NS flush syringe  20-30 mL Intracatheter Q1 MIN PRN   . NS flush syringe  2-6 mL Intracatheter Q8HRS   . NS flush syringe  2-6 mL Intracatheter Q1 MIN PRN   . oxyCODONE (ROXICODONE) immediate release tablet  2.5 mg Oral Q4H PRN   . oxyCODONE (ROXICODONE) immediate release tablet  5 mg Oral Q4H PRN   . pantoprazole (PROTONIX) delayed release tablet  40 mg Oral Q12H   . prochlorperazine (COMPAZINE) 5 mg/mL injection  10 mg Intravenous Q6H PRN   . promethazine (PHENERGAN) tablet  6.25 mg Oral Q6H PRN   . sennosides-docusate sodium (SENOKOT-S) 8.6-'50mg'$  per tablet  1 Tablet Oral 2x/day   . SSIP insulin R human (HUMULIN R) 100 units/mL injection  0-12 Units Subcutaneous Q6H PRN       Donna Christen, MD  General  Surgery // PGY-1  [place]

## 2021-05-18 NOTE — Consults (Signed)
[facility]  Endocrinology Consult  Follow Up Note    [name], [name], 76 y.o. male  Date of Service: 05/18/2021  Date of Birth:  07-May-1945    Hospital Day:  LOS: 45 days     Reason for Consult: : Diabetes management     [name] is a 76 y.o. 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      Subjective:   Patient seen at bedside , with the wife . Resting , per wife has no complains eating better without any nausea.          Current Facility-Administered Medications   Medication Dose Route Frequency Provider Last Rate Last Admin   . acetaminophen (TYLENOL) tablet  500 mg Oral Q4H PRN Donna Christen, MD   500 mg at 05/11/21 0841   . adult custom variable rate parenteral nutrition   Intravenous Continuous Donna Christen, MD 110 mL/hr at 05/18/21 0719 Solution Verified at 05/18/21 0719   . atorvastatin (LIPITOR) tablet  40 mg Oral Daily with Breakfast Nicola Police, MD   40 mg at 05/17/21 0919   . benzonatate (TESSALON) capsule  100 mg Oral Q8H PRN [name], MD       . D5W 250 mL flush bag   Intravenous Q15 Min PRN Modena Jansky, DO       . DAPTOmycin (CUBICIN) 700 mg in NS 50 mL IVPB  10 mg/kg Intravenous Q24H Mazzella, Leanne, PA-C   Stopped at 05/18/21 0526   . diatrizoate meglumine & sodium oral solution  8 mL Oral Give in Radiology Orlie Dakin, MD       . elvitegravir-cobicistat-emtricitrabine-tenofovir (GENVOYA) 150-150-200-10 mg per tablet  1 Tablet Oral Daily Monia Pouch, DO   1 Tablet at 05/17/21 0919   . gabapentin (NEURONTIN) capsule  300 mg Oral 3x/day Mariea Clonts, MD   300 mg at 05/17/21 2014   . heparin 25,000 units in NS 250 mL infusion  18 Units/kg/hr (Adjusted) Intravenous  Continuous Lucile Crater, MD 17.1 mL/hr at 05/18/21 0719 23 Units/kg/hr at 05/18/21 0719   . insulin NPH human 100 units/mL injection  30 Units Subcutaneous Daily after Concha Norway, Zipporah Plants, MD   30 Units at 05/17/21 1736   . insulin NPH human 100 units/mL injection  10 Units Subcutaneous Daily before Breakfast Sadiq, Mehjabeen, MD       . insulin R human (HUMULIN R) 100 units/mL injection - for tube feed coverage  12 Units Subcutaneous 3x/day Sadiq, Mehjabeen, MD       . LORazepam (ATIVAN) 2 mg/mL injection  0.5 mg Intravenous Q8H PRN Donna Christen, MD   0.5 mg at 05/17/21 2023   . melatonin tablet  6 mg Oral NIGHTLY Donna Christen, MD   6 mg at 05/17/21 2014   . meropenem (MERREM) 1 g in NS 100 mL IVPB  1 g Intravenous Jen Mow, MD   Stopped at 05/18/21 0757   . mirtazapine (REMERON) tablet  7.5 mg Oral NIGHTLY Donna Christen, MD   7.5 mg at 05/17/21 2014   . multivitamin-minerals-folic acid-lycopene-lutein (CERTAVITE SENIOR) tablet  1 Tablet Oral Q24H Cosner, Adam, PA-C   1 Tablet at 05/17/21 1503   . NS 250 mL flush bag  Intravenous Q15 Min PRN Modena Jansky, DO       . NS flush syringe  10-30 mL Intracatheter Q8HRS Nicola Police, MD   10 mL at 05/18/21 0600   . NS flush syringe  20-30 mL Intracatheter Q1 MIN PRN Nicola Police, MD       . NS flush syringe  2-6 mL Intracatheter Q8HRS Modena Jansky, DO   6 mL at 05/18/21 0600   . NS flush syringe  2-6 mL Intracatheter Q1 MIN PRN Modena Jansky, DO       . oxyCODONE (ROXICODONE) immediate release tablet  2.5 mg Oral Q4H PRN Stacey Drain, DO   2.5 mg at 05/10/21 1400   . oxyCODONE (ROXICODONE) immediate release tablet  5 mg Oral Q4H PRN Stacey Drain, DO   5 mg at 05/15/21 2006   . pantoprazole (PROTONIX) delayed release tablet  40 mg Oral Q12H [name], MD   40 mg at 05/18/21 0515   . prochlorperazine (COMPAZINE) 5 mg/mL injection  10 mg Intravenous Q6H PRN Mariea Clonts, MD       . promethazine  [facility]) tablet  6.25 mg Oral Q6H PRN Lucile Crater, MD   6.25 mg at 05/16/21 2109   . sennosides-docusate sodium (SENOKOT-S) 8.6-'50mg'$  per tablet  1 Tablet Oral 2x/day Cosner, Adam, PA-C   1 Tablet at 05/17/21 2014   . SSIP insulin R human (HUMULIN R) 100 units/mL injection  0-12 Units Subcutaneous Q6H PRN Malena Peer, MD   6 Units at 05/17/21 0856     Objective:  Temperature: 36.9 C (98.4 F)  Heart Rate: (!) 101  BP (Non-Invasive): (!) 156/88  Respiratory Rate: 16  SpO2: 93 %     General: Chronically ill appearing male in no acute distress.  HEENT: Head normocephalic, atraumatic. Conjunctiva clear. No proptosis or lid lag. Oral mucosa pink and moist.   Thyroid/Neck: No thyromegaly. Trachea midline.  Lungs: Normal respiratory effort.   Cardiac: Regular rate and rhythm.  Abdomen: Soft, non-tender, non-distended. Ostomy in place   Extremities: No edema, erythema, warmth, or tenderness.  Skin: Warm and dry. No visible rashes.  Neuro: sleepy ,no obvious focal neurologic deficit   Psychiatric: Normal mood and affect.      Lab Results   Component Value Date    WBC 21.8 (H) 05/18/2021    RBC 3.56 (L) 05/18/2021    HGB 9.8 (L) 05/18/2021    HCT 31.4 (L) 05/18/2021    MCV 88.2 05/18/2021    MCH 27.5 05/18/2021    MCHC 31.2 05/18/2021     Lab Results   Component Value Date    SODIUM 139 05/18/2021    POTASSIUM 4.3 05/18/2021    CO2 26 05/18/2021    BUN 29 (H) 05/18/2021    CREATININE 0.76 05/18/2021    GFR >90 05/18/2021    CALCIUM 10.0 05/18/2021    ALBUMIN 2.0 (L) 05/10/2021    ALKPHOS 80 05/17/2021    ALT 27 05/17/2021    AST 15 05/17/2021    ANIONGAP 7 05/18/2021     Lab Results   Component Value Date    CHOLESTEROL 212 (H) 12/21/2020    HDLCHOL 44 12/21/2020    LDLCHOL 96 12/21/2020    LDLCHOLDIR 89 03/24/2020    TRIG 132 05/17/2021      Lab Results   Component Value Date    HA1C 9.3 (H) 03/17/2021         Recent Labs  05/16/21  1212 05/16/21  1820 05/16/21  2054 05/17/21  0210 05/17/21  0805  05/17/21  1735 05/17/21  2212 05/18/21  0009 05/18/21  0201 05/18/21  0649 05/18/21  0807   GLUIP 379* 234* 191* 265* 272* 144* 149* 203* 197* 88 182*       Assessment/Recommendations:  [name] is a 76 y.o. 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 Endocrinology is consulted for management of diabetes.     Post Pancreatectomy Diabetes Mellitus-improved control   - Most recent HbA1c on 03/17/2021 was 9.3%.  - Home regimen: Lantus 11 units nightly, Novolog 4 units TID AC.   - Current inpatient regimen:   Insulin NPH 20/30   units bid, Humulin R 14     units Q6 hours, scheduled with TPN  and Humulin R conservative  sliding scale 4x/day PRN.   -Diet: MNT PROTOCOL FOR DIETITIAN  DIET DIABETIC Calorie amount: CC 2200  DIETARY ORAL SUPPLEMENTS Oral Supplements with tray: Ensure High Protein-Vanilla; BREAKFAST/LUNCH/DINNER; 1 Each  adult custom variable rate parenteral nutrition   Currently on TPN to run for 18 hrs (8 pm to 2 pm next day )     Patient morning sugar noted to be in low 80's, his scheduled insulin R was held and also NPH .     - Recommendations:  Since he is T1DM, will not hold NPH , reduce the dose to 10 units and schedule for 10/30 units BID dosing of NPH , eventually when he is off TPN will switch him to Lantus   Will decrease the insulin R to 10 units given with TPN ( 8pm , 2 am and 8 am )   Continue with insulin R every 6 hrs PRN per conservative scale   Will acces his meal time sugars especially with lunch and dinner time, and might add small dose of insulin R with dinner time ( since he does not get any scheduled insulin at dinner time )    Will assess Insulin requirements and make changes.       Thank you for this consult. Will continue to follow.  Please page with questions.    Mehjabeen YNWGN  Endocrine fellow    05/18/2021 12:33  Department of Endocrine, Diabetes and metabolism               05/18/2021  I saw and examined the patient.  I reviewed the fellow's note.  I agree with the findings and plan of care as documented in the fellow's note.  Any exceptions/additions are edited/noted.  Please notify endocrinology consult service if TPN is to be stopped.    Venancio Poisson, MD 05/18/2021, 21:21  Assistant Professor  Endocrinology & Metabolism  [facility]

## 2021-05-18 NOTE — Consults (Signed)
[facility] Consult  Follow Up Note    Aariv, Medlock, 76 y.o. 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  Persistent nausea/vomiting minimally relieved from Zofran/Compazine.    On exam the patient is resting in bed, he is awake, alert and oriented x3. The patient reports that he slept very well last night with the Ativan he was given prior to bed. He also states he has had no nausea since yesterday and has been able to tolerate a regular diet. He reiterates that his acid reflux is much better since re-starting Protonix. He moved his bowels this morning, denies any pain. Overall the patient seems in better spirits and states he is feeling well. He is hopeful to get to rehab by the end of the week. Wife at the bedside and her questions were answered.       Recommend:  -Remeron 7.'5mg'$  PO nightly for sleep and nausea   -Ativan 0.'5mg'$  IV every 8 hours PRN for nausea- would recommend giving 30 minutes prior to morning medication administration as this causes significant anticipatory nausea, Can transition Ativan to oral if patient is able to tolerate pills   -Protonix 40 mg IV BID  -Monitor EKG, especially if continuing medications such as Compazine, Zofran and Phenergan     Pain: Numeric 0    POST completed:  POST form not discussed    Subjective: Patient reports he has had no nausea    Objective:  Temperature: 36.9 C (98.4 F)  Heart Rate: (!) 101  BP (Non-Invasive): (!) 156/88  Respiratory Rate: 16  SpO2: 93 %     Constitutional:appears chronically ill, acutely ill, appears stated age, mild distress and vital signs reviewed  Eyes:Conjunctiva clear., Pupils equal and  round.  NKN:LZJQB mucous membranes moist.  Neck:supple, symmetrical, trachea midline  Respiratory:Clear to auscultation bilaterally.  Cardiovascular:regular rate and rhythm  Gastrointestinal:soft, non-tender, positive bowel sounds  Musculoskeletal:Head atraumatic and normocephalic and AROM  Integumentary:Skin warm and dry  Neurologic:Alert and oriented x3  Other symptoms present: None    Labs:    Lab Results Today:    Results for orders placed or performed during the hospital encounter of 04/03/21 (from the past 24 hour(s))   POC BLOOD GLUCOSE (RESULTS)   Result Value Ref Range    GLUCOSE, POC 144 (H) 70 - 105 mg/dl   PROCALCITONIN REFLEX 6HR   Result Value Ref Range    PROCALCITONIN 0.11 <0.50 ng/mL   POC BLOOD GLUCOSE (RESULTS)   Result Value Ref Range    GLUCOSE, POC 149 (H) 70 - 105 mg/dl   POC BLOOD GLUCOSE (RESULTS)   Result Value Ref Range    GLUCOSE, POC 203 (H) 70 - 105 mg/dl   POC BLOOD GLUCOSE (RESULTS)   Result Value Ref Range    GLUCOSE, POC 197 (H) 70 - 105 mg/dl   BASIC METABOLIC PANEL   Result Value Ref Range    SODIUM 139 136 - 145 mmol/L    POTASSIUM 4.3 3.5 - 5.1 mmol/L    CHLORIDE 106 96 - 111 mmol/L    CO2 TOTAL 26 23 - 31 mmol/L    ANION GAP 7 4 - 13 mmol/L    CALCIUM 10.0 8.8 -  10.2 mg/dL    GLUCOSE 169 (H) 65 - 125 mg/dL    BUN 29 (H) 8 - 25 mg/dL    CREATININE 0.76 0.75 - 1.35 mg/dL    BUN/CREA RATIO 38 (H) 6 - 22    ESTIMATED GFR >90 >=60 mL/min/BSA   PHOSPHORUS   Result Value Ref Range    PHOSPHORUS 2.7 2.3 - 4.0 mg/dL   MAGNESIUM   Result Value Ref Range    MAGNESIUM 2.1 1.8 - 2.6 mg/dL   CBC WITH DIFF   Result Value Ref Range    WBC 21.8 (H) 3.7 - 11.0 x10^3/uL    RBC 3.56 (L) 4.50 - 6.10 x10^6/uL    HGB 9.8 (L) 13.4 - 17.5 g/dL    HCT 31.4 (L) 38.9 - 52.0 %    MCV 88.2 78.0 - 100.0 fL    MCH 27.5 26.0 - 32.0 pg    MCHC 31.2 31.0 - 35.5 g/dL    RDW-CV 17.2 (H) 11.5 - 15.5 %    PLATELETS 538 (H) 150 - 400 x10^3/uL    MPV 12.6 (H) 8.7 - 12.5 fL    NEUTROPHIL % 70 %     LYMPHOCYTE % 19 %    MONOCYTE % 8 %    EOSINOPHIL % 0 %    BASOPHIL % 0 %    NEUTROPHIL # 15.22 (H) 1.50 - 7.70 x10^3/uL    LYMPHOCYTE # 4.14 1.00 - 4.80 x10^3/uL    MONOCYTE # 1.80 (H) 0.20 - 1.10 x10^3/uL    EOSINOPHIL # <0.10 <=0.50 x10^3/uL    BASOPHIL # <0.10 <=0.20 x10^3/uL    IMMATURE GRANULOCYTE % 3 (H) 0 - 1 %    IMMATURE GRANULOCYTE # 0.55 (H) <0.10 x10^3/uL   PTT (PARTIAL THROMBOPLASTIN TIME)   Result Value Ref Range    APTT 32.1 24.2 - 37.5 seconds   POC BLOOD GLUCOSE (RESULTS)   Result Value Ref Range    GLUCOSE, POC 88 70 - 105 mg/dl   POC BLOOD GLUCOSE (RESULTS)   Result Value Ref Range    GLUCOSE, POC 182 (H) 70 - 105 mg/dl       Ventilator Settings: N/A  HFNC Settings: N/A  BIPAP Settings: N/A    Imaging Studies:  Images and Reports reviewed to current date.    Did this encounter involve a discussion of hospice? No    Consultant:  Charise Carwin, APRN,FNP-BC    On the day of the encounter, a total of  15 minutes was spent on this patient encounter including review of historical information, examination, documentation and post-visit activities. The time documented excludes procedural time.    Charise Carwin, APRN,FNP-BC

## 2021-05-18 NOTE — Care Plan (Signed)
[facility]  Physical Therapy Progress Note      Patient Name: [name]  Date of Birth: October 15, 1945  Height:  175.3 cm (5' 9.02")  Weight:  72.1 kg (158 lb 15.2 oz)  Room/Bed: 961/A  Payor: [facility] [employer] / Plan: [place] [employer] / Product Type: Managed Care /     Assessment:     Mr. Vanderheiden was fatigued/weary and agreeable to PT tx. He transferred to EOB with less assistance but required significant assistance to achieve standing today. Once standing, he was able to amb a short distance in the hall with FWW and assist x 1 plus assistance for equipment and chair. He continues to demonstrate generalized weakness, decreased activity tol, impaired balance, and impaired mobility. His mobility is not currently adequate for safe d/c to home but he has good rehab potential. Rec IRF at d/c.    Discharge Needs:   Equipment Recommendation: TBD  Discharge Disposition: inpatient rehabilitation facility    JUSTIFICATION OF DISCHARGE RECOMMENDATION   Based on current diagnosis, functional performance prior to admission, and current functional performance, this patient requires continued PT services in inpatient rehabilitation facility in order to achieve significant functional improvements in these deficit areas: aerobic capacity/endurance, gait, locomotion, and balance, muscle performance.      Plan:   Continue to follow patient according to established plan of care.  The risks/benefits of therapy have been discussed with the patient/caregiver and he/she is in agreement with the established plan of care.     Subjective & Objective:        05/18/21 1235   Therapist Pager   PT Assigned/ Pager # [name] [phone]   Rehab Session   Document Type therapy progress note (daily note)   Total PT Minutes: 29   Patient Effort adequate   Symptoms Noted During/After Treatment fatigue   Symptoms Noted Comment Pt reported feeling bad/fatigued from ativan last night and meds for CT scan.    General Information   Patient Profile Reviewed yes   Patient/Family/Caregiver Comments/Observations Pt was resting in bed. He was fatigued/weary but agreeable to trying OOB. Wife was at b/s and supportive.   Medical Lines PICC Line;PIV Line;Peripheral Drain;TPN   Respiratory Status room air   Existing Precautions/Restrictions contact isolation;full code;fall precautions   General Observations of Patient Pt was seen at b/s on 9e for PT tx with permission of b/s nurse.   Mutuality/Individual Preferences   Individualized Care Needs OOB with FWW and assist x 2   Pre Treatment Status   Pre Treatment Patient Status Patient supine in bed;Call light within reach;Telephone within reach;Sitter select activated   Support Present Pre Treatment  Family present   Communication Pre Treatment  Nurse   Cognitive Assessment/Interventions   Behavior/Mood Observations cooperative;flat affect;impaired task initiation  (weary)   Attention mild impairment;difficulty attending to task/directions   Follows Commands follows two step commands;delayed response/completion;increased processing time needed;initiation impaired;physical/tactile prompts required;repetition of directions required   Comment Pt was weary/fatigued resulting in delayed initiation and need for repeated cues.   Pain Assessment   Pre/Posttreatment Pain Comment Pt reported nausea but no pain   Mobility Assessment/Training   Comment Pt transferred to sitting EOB. Attempted x 2 to stand to [facility] but was unsuccessful. He had to complete a stand pivot bed to chair via bear hug. He was taken to hall in chair. Stood and ambulated in hall with chair follow. He was reclined in chair at b/s at end of tx.  He did not want to stay in chair due to not feeling well but did eventually agree to sitting up for 30 minutes.   Bed Mobility Assessment/Treatment   Bed Mobility, Assistive Device bed rails;Head of Bed Elevated   Supine-Sit Independence contact guard assist   Sit to Supine,  Independence not tested   Safety Issues decreased use of arms for pushing/pulling;decreased use of legs for bridging/pushing;impaired trunk control for bed mobility   Impairments balance impaired;strength decreased   Comment Effort was labored and slow but pt was able to transfer to EOB with CGA.   Transfer Assessment/Treatment   Sit-Stand Independence moderate assist (50% patient effort);knee block;verbal cues required;2 person assist required   Stand-Sit Independence minimum assist (75% patient effort);verbal cues required   Sit-Stand-Sit, Assist Device walker, front wheeled   Bed-Chair Independence minimum assist (75% patient effort);verbal cues required   Bed-Chair-Bed Assist Device handheld assist  (bear hug)   Transfer Safety Issues balance decreased during turns;sequencing ability decreased;step length decreased;weight-shifting ability decreased   Transfer Impairments balance impaired;coordination impaired;endurance;postural control impaired;strength decreased   Transfer Comment Pt attempted to stand x 2 from EOB to FWW. With mod A x 1 and knee block, he was able to get his LEs nearly extended underneath him but could not lift his trunk into upright position. R LE slid out laterally and trunk collapsed to his L with the effort each time. He was able to stand and side step bed to chair with bear hug assist x 1. He stood from chair to [facility] with mod A x 2 with knee block and hand at his chest to help with postural extension.   Gait Assessment/Treatment   Independence  contact guard assist;1 person + 1 person to manage equipment   Assistive Device  walker, front wheeled   Distance in Feet ~ 50 ft   Gait Speed slow   Deviations  cadence decreased;double stance time increased;limb motion velocity decreased;step length decreased;stride length decreased;swing-to-stance ratio decreased;toe-to-floor clearance decreased;weight-shifting ability decreased   Safety Issues  step length decreased;weight-shifting ability  decreased   Impairments  balance impaired;endurance;postural control impaired;strength decreased   Comment Pt had short shuffling steps. Chair followed needed for safety.   Balance Skill Training   Comment with UE support in standing   Sitting Balance: Static fair balance   Sitting, Dynamic (Balance) fair - balance   Sit-to-Stand Balance poor balance   Standing Balance: Static fair - balance   Standing Balance: Dynamic poor balance   Systems Impairment Contributing to Balance Disturbance musculoskeletal   Identified Impairments Contributing to Balance Disturbance impaired coordination;impaired postural control;decreased strength   Therapeutic Exercise/Activity   Comment Pt completed B LAQ and hip flex seated in chair for ~ 15 reps of each   Post Treatment Status   Post Treatment Patient Status Patient sitting in bedside chair or w/c;Call light within reach;Telephone within reach   Support Present Post Treatment  Family present;Nurse present   Communication [place] With patient;spouse   Basic Mobility Am-PAC/6Clicks Score (APPROVED Staff)   Turning in bed without bedrails 3   Lying on back to sitting on edge of flat bed 3   Moving to and from a bed to a chair 2   Standing up from chair 2   Walk in room 3   Climbing 3-5 steps with railing 1   6 Clicks Raw Score total 14   Standardized (t-scale) score 35.55  CMS 0-100% Score 53.86   CMS Modifier CK   Patient Mobility Goal (JHHLM) 4- Move to chair 3X/day   Exercise/Activity Level Performed 7- Walked 25 feet or more   Physical Therapy Clinical Impression   Assessment Mr. Leonetti was fatigued/weary and agreeable to PT tx. He transferred to EOB with less assistance but required significant assistance to achieve standing today. Once standing, he was able to amb a short distance in the hall with FWW and assist x 1 plus assistance for equipment and chair. He continues to demonstrate generalized weakness,  decreased activity tol, impaired balance, and impaired mobility. His mobility is not currently adequate for safe d/c to home but he has good rehab potential. Rec IRF at d/c.   Anticipated Equipment Needs at Discharge (PT) TBD   Anticipated Discharge Disposition inpatient rehabilitation facility       Therapist:   Norlene Duel, PT   Pager #: [phone]

## 2021-05-18 NOTE — Consults (Signed)
INTERVENTIONAL RADIOLOGY  INITIAL BRIEF CONSULT NOTE      Patient Name: [name]  Patient MRN: [id number]  Patient DOB: 06-May-1945  Date of Service:05/18/2021  Admission Dx: Intra abdominal hemorrhage    Consult Requested By: SURG ONCOLOGY    History of Present Illness  The patient is 76 y.o. 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 Patient has a persistent LUQ fluid collection. A drain was previously placed which was removed as it was no longer draining. IR consulted for new drain placement.  Pertinent Imaging reveals: air containing fluid collection in the LUQ, similar in size compared to 05/10/2021.    VITALS            BP (!) 144/90   Pulse 99   Temp 36.7 C (98.1 F)   Resp 16   Ht 1.753 m (5' 9.02")   Wt 72.1 kg (158 lb 15.2 oz)   SpO2 92%   BMI 23.46 kg/m         LABS    CBC Differential   Recent Labs     05/16/21  0620 05/17/21  0419 05/18/21  0434   WBC 20.6* 26.0* 21.8*   HGB 7.4* 6.7* 9.8*   HCT 24.2* 22.2* 31.4*   PLTCNT 537* 533* 538*    Recent Labs     05/18/21  0434   PMNS 70   MONOCYTES 8   BASOPHILS 0  <0.10   PMNABS 15.22*   LYMPHSABS 4.14   MONOSABS 1.80*   EOSABS <0.10      BMP LFTs   Recent Labs     05/18/21  0434   SODIUM 139   POTASSIUM 4.3   CHLORIDE 106   CO2 26   BUN 29*   CREATININE 0.76   GLUCOSENF 169*   ANIONGAP 7   BUNCRRATIO 38*   GFR >90   CALCIUM 10.0   MAGNESIUM 2.1   PHOSPHORUS 2.7    No results found for this encounter   CoAgs Cardiac Markers   Recent Labs     05/18/21  1053   APTT 52.8*    No results for input(s): TROPONINI, CKMB, MBINDEX, BNP in the last 72 hours.   Urine Analysis Other Labs   No results found for this encounter No results found for this encounter    Invalid input(s): PRL     PMH  Past Medical History:   Diagnosis Date    Cancer (CMS HCC)     prostate    Deep vein thrombosis (DVT) (CMS HCC)     right leg    Esophageal reflux     H/O hearing loss     High cholesterol     History of kidney disease     kidney  damage from HIV meds taken years ago    HTN (hypertension)     Human immunodeficiency virus (HIV) disease (CMS HCC)     Hyperlipidemia     "borderline"    Hypothyroidism     MRSA (methicillin resistant staph aureus) culture positive 01/02/2021    MRSA left groin abscess 01/03/21    MRSA (methicillin resistant staph aureus) culture positive 01/02/2021    MRSA blood 01/02/21    Pulmonary embolism (CMS [place]) 03/31/2021    Thyroid disorder     Wears glasses          PSH     Past Surgical History:   Procedure Laterality Date    COLON SURGERY  per patient had 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 Darryl Nestle CERVICAL SPINE SURGERY  2001    HX COLOSTOMY REVERSAL      HX HERNIA REPAIR      KNEE SURGERY Bilateral     WRIST SURGERY Right              FAMILY HISTORY:    His family history includes Cancer in his father, mother, and another family member; Heart Attack in his mother; High Cholesterol in an other family member; Stroke in an other family member.     SOCIAL HISTORY:    He  reports that he does not currently use alcohol. He  reports that he has never smoked. He has never used smokeless tobacco. He  reports no history of drug use.     OUTPATIENT MEDICATIONS  Medications Prior to Admission       Prescriptions    acetaminophen (TYLENOL) 500 mg Oral Tablet    Take 2 Tablets (1,000 mg total) by mouth Every 4 hours as needed for Pain    amLODIPine (NORVASC) 5 mg Oral Tablet    Take 1 Tablet (5 mg total) by mouth Once a day    apixaban (ELIQUIS) 5 mg Oral Tablet    Take 2 Tablets (10 mg total) by mouth Twice daily for 14 doses    apixaban (ELIQUIS) 5 mg Oral Tablet    Take 1 Tablet (5 mg total) by mouth Twice daily for 180 days    atorvastatin (LIPITOR) 80 mg Oral Tablet    Take 0.5 Tablets (40 mg total) by mouth Every morning with breakfast    BIOTIN ORAL    Take 1 Tablet  by mouth Once a day    Blood Sugar Diagnostic (ACCU-CHEK GUIDE TEST STRIPS) Strip    1 Strip Four times a day - before meals and bedtime    cetirizine (ZYRTEC) 10 mg Oral Tablet    Take 1 Tablet (10 mg total) by mouth Once a day    cyanocobalamin (VITAMIN B 12) 1,000 mcg Oral Tablet    Take 1 Tablet (1,000 mcg total) by mouth Every morning    elviteg-cob-emtri-tenof ALAFEN (GENVOYA) 150-150-200-10 mg Oral Tablet    Take 1 Tablet by mouth Once a day    flash glucose scanning reader (FREESTYLE LIBRE 2 READER) Does not apply Misc    Use as directed with FreeStyle Libre 2 sensors    flash glucose sensor (FREESTYLE LIBRE 2 SENSOR) Does not apply Kit    To check blood glucose continuously. Change every 14 days    gabapentin (NEURONTIN) 300 mg Oral Capsule    Take 1 Capsule (300 mg total) by mouth Three times a day    insulin aspart U-100 (NOVOLOG) 100 unit/mL (3 mL) Subcutaneous Insulin Pen    Inject 4 Units under the skin Three times a day With meals    insulin glargine (LANTUS SOLOSTAR U-100 INSULIN) 100 unit/mL Subcutaneous Insulin Pen    Inject 15 units under the skin daily    insulin syr/ndl U100 half mark 0.3 mL 31 gauge x 5/16" Syringe    Use a new syringe for each injection.    lancets (FREESTYLE LANCETS) 28 gauge Misc    Use to test blood sugar 4 times a day -  before meals and bedtime    levothyroxine (SYNTHROID) 88 mcg Oral Tablet    Take 1 Tablet (88 mcg total) by mouth Every morning    melatonin 5 mg Oral Tablet    Take 1 Tablet (5 mg total) by mouth Every night    MULTIVITAMIN ORAL    Take 1 Tablet by mouth Once a day    pantoprazole (PROTONIX) 40 mg Oral Tablet, Delayed Release (E.C.)    Take 1 Tablet (40 mg total) by mouth Twice daily 30 minutes before a meal    Pen Needle, Disposable, (BD NANO 2ND GEN PEN NEEDLE) 32 gauge x 5/32" Needle    Use a new pen needle for each injection.    POTASSIUM ORAL    Take 1 Tablet by mouth Once a day          INPATIENT MEDICATIONS  acetaminophen (TYLENOL) tablet, 500 mg,  Oral, Q4H PRN  adult custom variable rate parenteral nutrition, , Intravenous, Continuous  adult custom variable rate parenteral nutrition, , Intravenous, Continuous  atorvastatin (LIPITOR) tablet, 40 mg, Oral, Daily with Breakfast  benzonatate (TESSALON) capsule, 100 mg, Oral, Q8H PRN  D5W 250 mL flush bag, , Intravenous, Q15 Min PRN  DAPTOmycin (CUBICIN) 700 mg in NS 50 mL IVPB, 10 mg/kg, Intravenous, Q24H  diatrizoate meglumine & sodium oral solution, 8 mL, Oral, Give in Radiology  elvitegravir-cobicistat-emtricitrabine-tenofovir (GENVOYA) 150-150-200-10 mg per tablet, 1 Tablet, Oral, Daily  gabapentin (NEURONTIN) capsule, 300 mg, Oral, 3x/day  heparin 25,000 units in NS 250 mL infusion, 18 Units/kg/hr (Adjusted), Intravenous, Continuous  insulin NPH human 100 units/mL injection, 10 Units, Subcutaneous, Daily before Breakfast  insulin NPH human 100 units/mL injection, 30 Units, Subcutaneous, Daily after Dinner  insulin R human (HUMULIN R) 100 units/mL injection - for tube feed coverage, 10 Units, Subcutaneous, 3x/day  LORazepam (ATIVAN) tablet, 0.5 mg, Oral, Q8H PRN  melatonin tablet, 6 mg, Oral, NIGHTLY  meropenem (MERREM) 1 g in NS 100 mL IVPB minibag, 1 g, Intravenous, Q8H  mirtazapine (REMERON) tablet, 7.5 mg, Oral, NIGHTLY  multivitamin-minerals-folic acid-lycopene-lutein (CERTAVITE SENIOR) tablet, 1 Tablet, Oral, Q24H  NS 250 mL flush bag, , Intravenous, Q15 Min PRN  NS flush syringe, 10-30 mL, Intracatheter, Q8HRS  NS flush syringe, 20-30 mL, Intracatheter, Q1 MIN PRN  NS flush syringe, 2-6 mL, Intracatheter, Q8HRS  NS flush syringe, 2-6 mL, Intracatheter, Q1 MIN PRN  oxyCODONE (ROXICODONE) immediate release tablet, 2.5 mg, Oral, Q4H PRN  oxyCODONE (ROXICODONE) immediate release tablet, 5 mg, Oral, Q4H PRN  pantoprazole (PROTONIX) delayed release tablet, 40 mg, Oral, Q12H  prochlorperazine (COMPAZINE) 5 mg/mL injection, 10 mg, Intravenous, Q6H PRN  promethazine (PHENERGAN) tablet, 6.25 mg, Oral, Q6H  PRN  sennosides-docusate sodium (SENOKOT-S) 8.6-50mg per tablet, 1 Tablet, Oral, 2x/day  SSIP insulin R human (HUMULIN R) 100 units/mL injection, 0-12 Units, Subcutaneous, Q6H PRN        MEDICATION ALLERGIES  No Known Allergies          ASSESSMENT & PLAN  76 y.o. 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 Patient has a persistent LUQ fluid collection. A drain was previously placed which was removed as it was no longer draining. IR consulted for new drain placement.    If a percutaneous drain were to be placed, likely would be a transpleural approach. Fluid collection may be amenable to lumen apposing metal stent placement (LAMS). Will be discussed at hepatobiliary conference on Wednesday.      Discussed with IR attending and  approved    Contact IR resident with patient status updates: Pager [phone]  Contact IR charge nurse for questions regarding scheduling: Phone [place], DO  05/18/2021, 16:56        I have discussed the case with the resident and agree with the documentation and plan as discussed above.   LUQ collection is walled-off and majority fluid with few pockets of air.  Suspect air indicates communication with previously placed LAMS, but could also indicate bacterial colonization of fluid which is not effectively draining through existing cyst gastrostomy.   Will plan to discuss patient tomorrow at [facility] Conference with IR, Surg Onc, and Advanced GI.       Warren Lacy, MD  [place] Attending  Dept of Radiology      05/18/2021, 17:07

## 2021-05-18 NOTE — Care Plan (Signed)
Problem: Adult Inpatient Plan of Care  Goal: Plan of Care Review  Outcome: Ongoing (see interventions/notes)  Goal: Patient-Specific Goal (Individualized)  Outcome: Ongoing (see interventions/notes)  Goal: Absence of Hospital-Acquired Illness or Injury  Outcome: Ongoing (see interventions/notes)  Intervention: Identify and Manage Fall Risk  Recent Flowsheet Documentation  Taken 05/18/2021 0736 by Tilden Dome, RN  Safety Promotion/Fall Prevention: activity supervised  Intervention: Prevent Skin Injury  Recent Flowsheet Documentation  Taken 05/18/2021 0736 by Tilden Dome, RN  Body Position: supine, head elevated  Skin Protection: adhesive use limited  Intervention: Prevent and Manage VTE (Venous Thromboembolism) Risk  Recent Flowsheet Documentation  Taken 05/18/2021 0736 by Tilden Dome, RN  VTE Prevention/Management:   ambulation promoted   dorsiflexion/plantar flexion performed  Intervention: Prevent Infection  Recent Flowsheet Documentation  Taken 05/18/2021 0736 by Tilden Dome, RN  Infection Prevention: single patient room provided  Goal: Optimal Comfort and Wellbeing  Outcome: Ongoing (see interventions/notes)  Intervention: Provide Person-Centered Care  Recent Flowsheet Documentation  Taken 05/18/2021 0736 by Tilden Dome, [place] Relationship/Rapport:   care explained   choices provided   emotional support provided   empathic listening provided   questions answered   questions encouraged   reassurance provided   thoughts/feelings acknowledged  Goal: Rounds/Family Conference  Outcome: Ongoing (see interventions/notes)     Problem: Fall Injury Risk  Goal: Absence of Fall and Fall-Related Injury  Outcome: Ongoing (see interventions/notes)  Intervention: Identify and Manage Contributors  Recent Flowsheet Documentation  Taken 05/18/2021 0736 by Tilden Dome, RN  Self-Care Promotion: independence encouraged  Medication Review/Management: medications reviewed  Intervention: Promote Injury-Free  Environment  Recent Flowsheet Documentation  Taken 05/18/2021 0736 by Tilden Dome, RN  Safety Promotion/Fall Prevention: activity supervised     Problem: Skin Injury Risk Increased  Goal: Skin Health and Integrity  Outcome: Ongoing (see interventions/notes)  Intervention: Optimize Skin Protection  Recent Flowsheet Documentation  Taken 05/18/2021 0736 by Tilden Dome, RN  Pressure Reduction Techniques: frequent weight shift encouraged  Pressure Reduction Devices: (M,H,VH) Use Repositioning Devices or Pillows  Skin Protection: adhesive use limited  Activity Management: activity adjusted per tolerance  Head of Bed (HOB) Positioning: HOB at 30-45 degrees     Problem: Wound Healing Progression  Goal: Optimal Wound Healing  Outcome: Ongoing (see interventions/notes)  Intervention: Promote Wound Healing  Recent Flowsheet Documentation  Taken 05/18/2021 0736 by Tilden Dome, RN  Pressure Reduction Techniques: frequent weight shift encouraged  Pressure Reduction Devices: (M,H,VH) Use Repositioning Devices or Pillows  Sleep/Rest Enhancement: consistent schedule promoted  Activity Management: activity adjusted per tolerance     Problem: Acute Rehab Services Goal & Intervention Plan  Goal: Bathing Goal  Description: Stand Alone Therapy Goal  Outcome: Ongoing (see interventions/notes)  Goal: Bed Mobility Goal  Description: Stand Alone Therapy Goal  Outcome: Ongoing (see interventions/notes)  Goal: Caregiver Training Goal  Description: Stand Alone Therapy Goal  Outcome: Ongoing (see interventions/notes)  Goal: Cognition Goal  Description: Stand Alone Therapy Goal  Outcome: Ongoing (see interventions/notes)  Goal: Cognition Goals, SLP  Description: Stand Alone Therapy Goal  Outcome: Ongoing (see interventions/notes)  Goal: Communication Goals, SLP  Description: Stand Alone Therapy Goal  Outcome: Ongoing (see interventions/notes)  Goal: Dysphagia Goals, SLP  Description: Stand Alone Therapy Goal  Outcome: Ongoing (see  interventions/notes)  Goal: Eating Self-Feeding Goal  Description: Stand Alone Therapy Goal  Outcome: Ongoing (see interventions/notes)  Goal: Gait Training Goal  Description: Stand Alone Therapy Goal  Outcome: Ongoing (see interventions/notes)  Goal: Grooming Goal  Description: Stand Alone Therapy Goal  Outcome: Ongoing (see interventions/notes)  Goal: Home Management Goal  Description: Stand Alone Therapy Goal  Outcome: Ongoing (see interventions/notes)  Goal: Interprofessional Goal  Description: Stand Alone Therapy Goal  Outcome: Ongoing (see interventions/notes)  Goal: LB Dressing Goal  Description: Stand Alone Therapy Goal  Outcome: Ongoing (see interventions/notes)  Goal: Occupational Therapy Goals  Description: Stand Alone Therapy Goal  Outcome: Ongoing (see interventions/notes)  Goal: Physical Therapy Goal  Description: Stand Alone Therapy Goal  Outcome: Ongoing (see interventions/notes)  Goal: Range of Motion Goal  Description: Stand Alone Therapy Goal  Outcome: Ongoing (see interventions/notes)  Goal: Strength Goal  Description: Stand Alone Therapy Goal  Outcome: Ongoing (see interventions/notes)  Goal: Toileting Goal  Description: Stand Alone Therapy Goal  Outcome: Ongoing (see interventions/notes)  Goal: Goal Transfer Training  Description: Stand Alone Therapy Goal  Outcome: Ongoing (see interventions/notes)  Goal: UB Dressing Goal  Description: Stand Alone Therapy Goal  Outcome: Ongoing (see interventions/notes)

## 2021-05-18 NOTE — Nurses Notes (Addendum)
Paged Surgery oncology: pt [address]. Pt has scheduled 20 Units NPH and 14 units of humulin. Do you still want this given? [place] Nadeen Landau, LPN  2/57/5051, 83:35    Dr. Annie Main call and stated to hold all insulins for now. Clarise Cruz. Nadeen Landau, LPN  09/24/1896, 42:10

## 2021-05-18 NOTE — Nurses Notes (Signed)
Text paged Marvel Plan [place]  [place] line is hard to flush and not drawing blood. Need an order for Alteplase. thanks. #[phone]

## 2021-05-19 ENCOUNTER — Encounter (INDEPENDENT_AMBULATORY_CARE_PROVIDER_SITE_OTHER): Payer: Self-pay | Admitting: SURGICAL ONCOLOGY

## 2021-05-19 DIAGNOSIS — Y838 Other surgical procedures as the cause of abnormal reaction of the patient, or of later complication, without mention of misadventure at the time of the procedure: Secondary | ICD-10-CM

## 2021-05-19 LAB — POC BLOOD GLUCOSE (RESULTS)
GLUCOSE, POC: 180 mg/dl — ABNORMAL HIGH (ref 70–105)
GLUCOSE, POC: 166 mg/dl — ABNORMAL HIGH (ref 70–105)
GLUCOSE, POC: 230 mg/dl — ABNORMAL HIGH (ref 70–105)
GLUCOSE, POC: 212 mg/dl — ABNORMAL HIGH (ref 70–105)
GLUCOSE, POC: 217 mg/dl — ABNORMAL HIGH (ref 70–105)
GLUCOSE, POC: 127 mg/dl — ABNORMAL HIGH (ref 70–105)
GLUCOSE, POC: 177 mg/dl — ABNORMAL HIGH (ref 70–105)

## 2021-05-19 LAB — CBC
HCT: 27.1 % — ABNORMAL LOW (ref 38.9–52.0)
HGB: 8.6 g/dL — ABNORMAL LOW (ref 13.4–17.5)
MCH: 27.9 pg (ref 26.0–32.0)
MCHC: 31.7 g/dL (ref 31.0–35.5)
MCV: 88 fL (ref 78.0–100.0)
MPV: 12.6 fL — ABNORMAL HIGH (ref 8.7–12.5)
PLATELETS: 434 10*3/uL — ABNORMAL HIGH (ref 150–400)
RBC: 3.08 10*6/uL — ABNORMAL LOW (ref 4.50–6.10)
RDW-CV: 17.2 % — ABNORMAL HIGH (ref 11.5–15.5)
WBC: 17.9 10*3/uL — ABNORMAL HIGH (ref 3.7–11.0)

## 2021-05-19 LAB — PHOSPHORUS: PHOSPHORUS: 3.6 mg/dL (ref 2.3–4.0)

## 2021-05-19 LAB — BASIC METABOLIC PANEL
ANION GAP: 6 mmol/L (ref 4–13)
BUN/CREA RATIO: 36 — ABNORMAL HIGH (ref 6–22)
BUN: 27 mg/dL — ABNORMAL HIGH (ref 8–25)
CALCIUM: 8.9 mg/dL (ref 8.8–10.2)
CHLORIDE: 100 mmol/L (ref 96–111)
CO2 TOTAL: 27 mmol/L (ref 23–31)
CREATININE: 0.76 mg/dL (ref 0.75–1.35)
ESTIMATED GFR: >90 mL/min/BSA (ref >=60)
GLUCOSE: 170 mg/dL — ABNORMAL HIGH (ref 65–125)
POTASSIUM: 4.1 mmol/L (ref 3.5–5.1)
SODIUM: 133 mmol/L — ABNORMAL LOW (ref 136–145)

## 2021-05-19 LAB — ECG 12-LEAD
Atrial Rate: 104 {beats}/min
Calculated P Axis: 47 degrees
Calculated R Axis: 30 degrees
Calculated T Axis: 52 degrees
PR Interval: 136 ms
QRS Duration: 74 ms
QT Interval: 366 ms
QTC Calculation: 481 ms
Ventricular rate: 104 {beats}/min

## 2021-05-19 LAB — ADULT ROUTINE BLOOD CULTURE, SET OF 2 BOTTLES (BACTERIA AND YEAST): BLOOD CULTURE, ROUTINE: NO GROWTH

## 2021-05-19 LAB — PTT (PARTIAL THROMBOPLASTIN TIME)
APTT: 51.3 seconds — ABNORMAL HIGH (ref 24.2–37.5)
APTT: 94.7 seconds — ABNORMAL HIGH (ref 24.2–37.5)
APTT: 97.6 seconds — ABNORMAL HIGH (ref 24.2–37.5)
APTT: 94.4 seconds — ABNORMAL HIGH (ref 24.2–37.5)

## 2021-05-19 LAB — MAGNESIUM: MAGNESIUM: 2 mg/dL (ref 1.8–2.6)

## 2021-05-19 MED ORDER — WATER FOR INJECTION, STERILE INTRAVENOUS SOLUTION
INTRAVENOUS | Status: AC
Start: 2021-05-19 — End: 2021-05-20
  Filled 2021-05-19: qty 1000

## 2021-05-19 MED ORDER — INSULIN REGULAR HUMAN 100 UNIT/ML INJECTION - FOR TUBE FEED
8.0000 [IU] | Freq: Three times a day (TID) | INTRAMUSCULAR | Status: DC
Start: 2021-05-19 — End: 2021-05-25
  Administered 2021-05-19 (×2): 8 [IU] via SUBCUTANEOUS
  Administered 2021-05-20: 0 [IU] via SUBCUTANEOUS
  Administered 2021-05-20: 8 [IU] via SUBCUTANEOUS
  Administered 2021-05-20 – 2021-05-21 (×2): 0 [IU] via SUBCUTANEOUS
  Administered 2021-05-21 – 2021-05-23 (×8): 8 [IU] via SUBCUTANEOUS
  Administered 2021-05-24: 0 [IU] via SUBCUTANEOUS
  Administered 2021-05-24 – 2021-05-25 (×4): 8 [IU] via SUBCUTANEOUS

## 2021-05-19 MED ORDER — INSULIN NPH ISOPHANE U-100 HUMAN 100 UNIT/ML SUBCUTANEOUS SUSPENSION
15.0000 [IU] | Freq: Every morning | SUBCUTANEOUS | Status: DC
  Administered 2021-05-20 – 2021-05-28 (×9): 15 [IU] via SUBCUTANEOUS

## 2021-05-19 MED ORDER — HEPARIN (PORCINE) 5,000 UNITS/ML BOLUS FOR DOSE ADJUSTMENT
25.0000 [IU]/kg | Freq: Once | INTRAMUSCULAR | Status: AC
Start: 2021-05-19 — End: 2021-05-19
  Administered 2021-05-19: 2000 [IU] via INTRAVENOUS
  Filled 2021-05-19: qty 1

## 2021-05-19 NOTE — Nurses Notes (Signed)
PTT: 94.4 result within goal range

## 2021-05-19 NOTE — Consults (Signed)
[facility]  Endocrinology Consult  Follow Up Note    [name], [name], 76 y.o. male  Date of Service: 05/19/2021  Date of Birth:  Mar 17, 1945    Hospital Day:  LOS: 46 days     Reason for Consult: : Diabetes management     [name] is a 76 y.o. 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      Subjective:   Patient seen at bedside , with the wife . Feels better , had been eating better as well , ate pancakes and ice cream this morning and was able to tolerate it ,without any nausea or vomiting         Current Facility-Administered Medications   Medication Dose Route Frequency Provider Last Rate Last Admin   . acetaminophen (TYLENOL) tablet  500 mg Oral Q4H PRN Donna Christen, MD   500 mg at 05/11/21 0841   . adult custom variable rate parenteral nutrition   Intravenous Continuous Allene Pyo, PA-C 110 mL/hr at 05/19/21 6294 Solution Verified at 05/19/21 0723   . atorvastatin (LIPITOR) tablet  40 mg Oral Daily with Breakfast Nicola Police, MD   40 mg at 05/19/21 0854   . benzonatate (TESSALON) capsule  100 mg Oral Q8H PRN [name], MD       . D5W 250 mL flush bag   Intravenous Q15 Min PRN Modena Jansky, DO       . DAPTOmycin (CUBICIN) 700 mg in NS 50 mL IVPB  10 mg/kg Intravenous Q24H Mazzella, Leanne, PA-C   Stopped at 05/19/21 0550   . diatrizoate meglumine & sodium oral solution  8 mL Oral Give in Radiology Orlie Dakin, MD       . elvitegravir-cobicistat-emtricitrabine-tenofovir (GENVOYA) 150-150-200-10 mg per tablet  1 Tablet Oral Daily Monia Pouch, DO   1 Tablet at 05/19/21 [phone]   . gabapentin (NEURONTIN) capsule  300 mg Oral 3x/day Mariea Clonts, MD   300 mg at 05/19/21 0854   . heparin 25,000  units in NS 250 mL infusion  18 Units/kg/hr (Adjusted) Intravenous Continuous Lucile Crater, MD 18.6 mL/hr at 05/19/21 0723 25 Units/kg/hr at 05/19/21 0723   . insulin NPH human 100 units/mL injection  10 Units Subcutaneous Daily before Breakfast Malena Peer, MD   10 Units at 05/19/21 6503   . insulin NPH human 100 units/mL injection  30 Units Subcutaneous Daily after [place] Meek, Dennis Bast, MD   30 Units at 05/18/21 2121   . insulin R human (HUMULIN R) 100 units/mL injection - for tube feed coverage  8 Units Subcutaneous 3x/day Malena Peer, MD   8 Units at 05/19/21 0901   . melatonin tablet  6 mg Oral NIGHTLY Donna Christen, MD   6 mg at 05/18/21 2119   . meropenem (MERREM) 1 g in NS 100 mL IVPB minibag  1 g Intravenous Q8H Nicola Police, MD 33 mL/hr at 05/19/21 0605 1 g at 05/19/21 0605   . mirtazapine (REMERON) tablet  7.5 mg Oral NIGHTLY Donna Christen, MD   7.5 mg at 05/18/21 2119   . multivitamin-minerals-folic acid-lycopene-lutein (CERTAVITE SENIOR) tablet  1 Tablet Oral Q24H Cosner, Adam, PA-C   1 Tablet at 05/18/21 1351   . NS 250 mL  flush bag   Intravenous Q15 Min PRN Modena Jansky, DO       . NS flush syringe  10-30 mL Intracatheter Q8HRS Nicola Police, MD   10 mL at 05/18/21 0600   . NS flush syringe  20-30 mL Intracatheter Q1 MIN PRN Nicola Police, MD       . NS flush syringe  2-6 mL Intracatheter Q8HRS Modena Jansky, DO   6 mL at 05/18/21 0600   . NS flush syringe  2-6 mL Intracatheter Q1 MIN PRN Modena Jansky, DO       . oxyCODONE (ROXICODONE) immediate release tablet  2.5 mg Oral Q4H PRN Stacey Drain, DO   2.5 mg at 05/10/21 1400   . oxyCODONE (ROXICODONE) immediate release tablet  5 mg Oral Q4H PRN Stacey Drain, DO   5 mg at 05/15/21 2006   . pantoprazole (PROTONIX) delayed release tablet  40 mg Oral Q12H [name], MD   40 mg at 05/19/21 0531   . prochlorperazine (COMPAZINE) 5 mg/mL injection  10 mg Intravenous Q6H PRN Mariea Clonts,  MD       . promethazine [facility]) tablet  6.25 mg Oral Q6H PRN Lucile Crater, MD   6.25 mg at 05/16/21 2109   . sennosides-docusate sodium (SENOKOT-S) 8.6-'50mg'$  per tablet  1 Tablet Oral 2x/day Cosner, Adam, PA-C   1 Tablet at 05/18/21 2119   . SSIP insulin R human (HUMULIN R) 100 units/mL injection  0-12 Units Subcutaneous Q6H PRN Malena Peer, MD   4 Units at 05/19/21 0902     Objective:  Temperature: 36.6 C (97.9 F)  Heart Rate: 95  BP (Non-Invasive): (!) 145/89 (LPN Notified)  Respiratory Rate: 18  SpO2: 91 %     General: Chronically ill appearing male in no acute distress.  HEENT: Head normocephalic, atraumatic. Conjunctiva clear. No proptosis or lid lag. Oral mucosa pink and moist.   Thyroid/Neck: No thyromegaly. Trachea midline.  Lungs: Normal respiratory effort.   Cardiac: Regular rate and rhythm.  Abdomen: Soft, non-tender, non-distended. Ostomy in place   Extremities: No edema, erythema, warmth, or tenderness.  Skin: Warm and dry. No visible rashes.  Neuro: sleepy ,no obvious focal neurologic deficit   Psychiatric: Normal mood and affect.      Lab Results   Component Value Date    WBC 17.9 (H) 05/19/2021    RBC 3.08 (L) 05/19/2021    HGB 8.6 (L) 05/19/2021    HCT 27.1 (L) 05/19/2021    MCV 88.0 05/19/2021    MCH 27.9 05/19/2021    MCHC 31.7 05/19/2021     Lab Results   Component Value Date    SODIUM 133 (L) 05/19/2021    POTASSIUM 4.1 05/19/2021    CO2 27 05/19/2021    BUN 27 (H) 05/19/2021    CREATININE 0.76 05/19/2021    GFR >90 05/19/2021    CALCIUM 8.9 05/19/2021    ALBUMIN 2.0 (L) 05/10/2021    ALKPHOS 80 05/17/2021    ALT 27 05/17/2021    AST 15 05/17/2021    ANIONGAP 6 05/19/2021     Lab Results   Component Value Date    CHOLESTEROL 212 (H) 12/21/2020    HDLCHOL 44 12/21/2020    LDLCHOL 96 12/21/2020    LDLCHOLDIR 89 03/24/2020    TRIG 132 05/17/2021      Lab Results   Component Value Date    HA1C 9.3 (H) 03/17/2021  Recent Labs     05/17/21  1735 05/17/21  2212 05/18/21  0009  05/18/21  0201 05/18/21  3546 05/18/21  0807 05/18/21  1458 05/18/21  2012 05/19/21  0207 05/19/21  0641 05/19/21  0900   GLUIP 144* 149* 203* 197* 88 182* 157* 127* 180* 166* 230*       Assessment/Recommendations:  [name] is a 76 y.o. 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 Endocrinology is consulted for management of diabetes.     Post Pancreatectomy Diabetes Mellitus-improved control   - Most recent HbA1c on 03/17/2021 was 9.3%.  - Home regimen: Lantus 11 units nightly, Novolog 4 units TID AC.   - Current inpatient regimen:   Insulin NPH 10/30   units bid, Humulin R 10     units Q6 hours, scheduled with TPN  and Humulin R conservative  sliding scale 4x/day PRN.   -Diet: MNT PROTOCOL FOR DIETITIAN  DIET DIABETIC Calorie amount: CC 2200  DIETARY ORAL SUPPLEMENTS Oral Supplements with tray: Ensure High Protein-Vanilla; BREAKFAST/LUNCH/DINNER; 1 Each  adult custom variable rate parenteral nutrition   Currently on TPN to run for 18 hrs (8 pm to 2 pm ) , per the pharmacy team plan to cycle it for 16 hrs from tonight onwards, from 8 pm to 12 pm .  Patient's 8 pm dose of insulin R was skipped due to lows, also noted to have high at morning time    .     - Recommendations:  Since he is T1DM, will not hold NPH .  eventually when he is off TPN will switch him to Lantus . Will increase the morning NPH to 15 units, continue 30 units at night time .    Will decrease the insulin R to 8 units with TPN , will keep schedule at 8 pm , 2 am and 8 am while on 16 hr TPN     Continue with insulin R every 6 hrs PRN per conservative scale .    Patient eating more regularly , the BID NPH dosing with insulin R and sliding scale coverage will be able to cover potential highs with meal  time, however if we notice the sugars are high especially at lunch or dinner time would have him on scheduled insulin for that time .      Will assess Insulin requirements and make changes.       Thank you for this consult. Will continue to follow. Please page with questions.    [name]  Endocrine fellow    05/19/2021 09:11  Department of Endocrine, Diabetes and metabolism          05/19/2021  I saw and examined the patient.  I reviewed the fellow's note.  I agree with the findings and plan of care as documented in the fellow's note.  Any exceptions/additions are edited/noted.    Venancio Poisson, MD 05/19/2021, 21:11  Assistant Professor  Endocrinology & Metabolism  [place] [facility]

## 2021-05-19 NOTE — Progress Notes (Signed)
This patient was discussed at Multidisciplinary Liver and Pancreas Diseases Conference on 05/19/2021      The conference members include Abdominal Radiology, Advanced Therapeutic Endoscopy, Medical Oncology, Surgical Oncology, General Surgery, Pathology, Interventional Radiology and [facility].     The recommendation includes:  EUS LAMs procedure. If unable to do that then to do a perc drain.    For any questions, please contact the primary consulting service-   [facility]

## 2021-05-19 NOTE — Consults (Signed)
[facility] Consult  Follow Up Note    Nealy, Karapetian, 76 y.o. 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  Persistent nausea/vomiting minimally relieved from Zofran/Compazine.    On exam this morning the patient is sitting up in the bed, he is awake, alert and oriented x3. The patient reports that he has had no nausea/vomiting but does report some restlessness this morning. He states that he is just having trouble getting comfortable in the bed. Patient reminded that he has Tylenol and Oxy available if he is having pain and he verbalizes understanding. The patient seems to be tolerating a regular diet well with no complaints.   Overall the patients symptoms seem to be better controlled, he has not required any PRN medications for nausea over the last 2 days.    Thank you for the consult, supportive care will sign off. Please re-consult with any needs arise.      Recommend:  -Remeron 7.'5mg'$  PO nightly for sleep and nausea   -Protonix 40 mg IV BID  -Monitor EKG, especially if continuing medications such as Compazine, Zofran and Phenergan    Pain: Numeric 0    POST completed:  POST form not discussed    Subjective: Patient reports he feels ok today, has had some abdominal discomfort but reports no nausea     Objective:  Temperature: 36.6 C (97.9 F)  Heart Rate: 95  BP (Non-Invasive): (!) 145/89 (LPN Notified)  Respiratory Rate: 18  SpO2: 91 %       OtConstitutional:appears chronically ill, acutely ill, appears stated age, mild distress and vital signs reviewed  Eyes:Conjunctiva clear., Pupils equal and round.  IOX:BDZHG mucous membranes moist.  Neck:supple, symmetrical,  trachea midline  Respiratory:Clear to auscultation bilaterally.  Cardiovascular:regular rate and rhythm  Gastrointestinal:soft, non-tender, positive bowel sounds  Musculoskeletal:Head atraumatic and normocephalic and AROM  Integumentary:Skin warm and dry  Neurologic:Alert and oriented x3  Other symptoms present:None    Labs:    Lab Results Today:    Results for orders placed or performed during the hospital encounter of 04/03/21 (from the past 24 hour(s))   POC BLOOD GLUCOSE (RESULTS)   Result Value Ref Range    GLUCOSE, POC 157 (H) 70 - 105 mg/dl   PTT (PARTIAL THROMBOPLASTIN TIME)   Result Value Ref Range    APTT 34.4 24.2 - 37.5 seconds   POC BLOOD GLUCOSE (RESULTS)   Result Value Ref Range    GLUCOSE, POC 127 (H) 70 - 105 mg/dl   PTT (PARTIAL THROMBOPLASTIN TIME)   Result Value Ref Range    APTT 51.3 (H) 24.2 - 37.5 seconds   BASIC METABOLIC PANEL   Result Value Ref Range    SODIUM 133 (L) 136 - 145 mmol/L    POTASSIUM 4.1 3.5 - 5.1 mmol/L    CHLORIDE 100 96 - 111 mmol/L    CO2 TOTAL 27 23 - 31 mmol/L    ANION GAP 6 4 - 13 mmol/L    CALCIUM 8.9 8.8 - 10.2 mg/dL    GLUCOSE 170 (H) 65 - 125 mg/dL    BUN 27 (H) 8 - 25 mg/dL    CREATININE 0.76 0.75 - 1.35 mg/dL  BUN/CREA RATIO 36 (H) 6 - 22    ESTIMATED GFR >90 >=60 mL/min/BSA   PHOSPHORUS   Result Value Ref Range    PHOSPHORUS 3.6 2.3 - 4.0 mg/dL   MAGNESIUM   Result Value Ref Range    MAGNESIUM 2.0 1.8 - 2.6 mg/dL   CBC   Result Value Ref Range    WBC 17.9 (H) 3.7 - 11.0 x10^3/uL    RBC 3.08 (L) 4.50 - 6.10 x10^6/uL    HGB 8.6 (L) 13.4 - 17.5 g/dL    HCT 27.1 (L) 38.9 - 52.0 %    MCV 88.0 78.0 - 100.0 fL    MCH 27.9 26.0 - 32.0 pg    MCHC 31.7 31.0 - 35.5 g/dL    RDW-CV 17.2 (H) 11.5 - 15.5 %    PLATELETS 434 (H) 150 - 400 x10^3/uL    MPV 12.6 (H) 8.7 - 12.5 fL   POC BLOOD GLUCOSE (RESULTS)   Result Value Ref Range    GLUCOSE, POC 180 (H) 70 - 105 mg/dl   POC BLOOD GLUCOSE (RESULTS)   Result Value Ref Range    GLUCOSE, POC 166 (H) 70 - 105 mg/dl    POC BLOOD GLUCOSE (RESULTS)   Result Value Ref Range    GLUCOSE, POC 230 (H) 70 - 105 mg/dl   PTT (PARTIAL THROMBOPLASTIN TIME)   Result Value Ref Range    APTT 94.7 (H) 24.2 - 37.5 seconds       Ventilator Settings: N/A  HFNC Settings: N/A  BIPAP Settings: N/A    Imaging Studies:  CT Chest/Abdomen/Pelvis from 05/18/2021 reviewed:  IMPRESSION:  1.Postsurgical changes from distal pancreatectomy and splenectomy with complex fluid in the surgical beds similar in size to prior exam however with improving internal air.  2.Midline abdominal wall incision with underlying fluid and air grossly unchanged from prior exam.  3.Persistent portion of the transverse colon abutting the ventral abdominal wall without frank communication identified.  4.Trace left pleural effusion with bibasilar atelectasis.  5.Punctate calcifications layering within the bladder.    Did this encounter involve a discussion of hospice? No    Consultant:  Charise Carwin, APRN,FNP-BC    On the day of the encounter, a total of  20 minutes was spent on this patient encounter including review of historical information, examination, documentation and post-visit activities. The time documented excludes procedural time.    Charise Carwin, APRN,FNP-BC

## 2021-05-19 NOTE — Progress Notes (Signed)
[facility]  Surgical Oncology  Progress Note      [name], [name], 76 y.o. male  Date of Birth:  [birth year]  Date of Admission:  04/03/2021  Date of service: 05/19/2021    Assessment:  This is a 76 y.o. 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 He underwent massive transfusion protocol and ex lap on 04/03/21 at an outside facility with ligation of a bleeding vessel in the liver bed. Patient was subsequently transferred to [facility] postoperaively, intubated. Patient was subsequently extubated later that day on 3/4. 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 He failed advancing his diet to fulls the week of 4/08, backed off to TPN / clears after he developed severe nausea.    Plan/Recommendations:  - CT A/P with IV and oral contrast   Stable LUQ collection   - Positive blood culture    - One of two bottles positive for GPCs   - Repeat blood cultures 05/17/21 NGTD   - Continue antibiotics at this time, deescalate when able   - Appreciate ID recs  - Nausea:   QTc prolonged at 480   Keep PRN Zofran & PRN Phenergan PO  - Continue heparin gtt  - Diet: Diabetic diet as tolerated  - Peripancreatic fluid collections   - Advanced GI performed EUS with cystogastrostomy 4/6              - Blood cultures 3/9: + pseudomonas    - Cultures form R JP: Pseudomonas and enterococcus              - ID consulted; Daptomycin and meropenum (completed 4/10)  - PE   - s/p IVC filter placement 3/17  - Midline fistula with ostomy bag to monitor output  - OOB TID, encourage ambulation  - Pulmonary toilet, keep IS accessible at bedside  - PT/OT ordered, encourage pt to work with therapy  - Dispo: Floor      Subjective:   Pt  seen in NAD. Sitting comfortably in chair this am. States he feels good this am.      Objective  Filed Vitals:    05/18/21 1501 05/18/21 1915 05/18/21 2322 05/19/21 0418   BP: (!) 144/90 130/83 (!) 140/86 (!) 143/88   Pulse: 99 98 94 86   Resp: '16 16 16 16   '$ Temp: 36.7 C (98.1 F) 36.8 C (98.2 F) 36.7 C (98.1 F) 36.5 C (97.7 F)   SpO2: 92% 93% 93% 93%     Physical Exam:   GEN:  AOx4, resting in bed, no acute distress  PULM: Normal respiratory effort. Equal/symmetric chest rise.  CV:  Pink, well perfused, RR  ABD:   Abdomen soft, nontender, nondistended. Lower midline incision with Eakins pouch with thin tan stool  MS: Atraumatic. Moves all extremities.  NEURO:   Alert and oriented to person, place and time.  Cranial nerves grossly intact.    Integumentary:  Pink, warm, and dry  PSYCHOSOCIAL: Pleasant.  Normal affect.     Labs  Results for orders placed or performed during the hospital encounter of 04/03/21 (from the past 24 hour(s))   PTT (PARTIAL THROMBOPLASTIN TIME)   Result Value Ref Range  APTT 52.8 (H) 24.2 - 37.5 seconds    Narrative    Therapeutic range for unfractionated heparin is 60-100 seconds.   PTT (PARTIAL THROMBOPLASTIN TIME)   Result Value Ref Range    APTT 34.4 24.2 - 37.5 seconds    Narrative    Therapeutic range for unfractionated heparin is 60-100 seconds.   PTT (PARTIAL THROMBOPLASTIN TIME)   Result Value Ref Range    APTT 51.3 (H) 24.2 - 37.5 seconds    Narrative    Therapeutic range for unfractionated heparin is 60-100 seconds.   BASIC METABOLIC PANEL   Result Value Ref Range    SODIUM 133 (L) 136 - 145 mmol/L    POTASSIUM 4.1 3.5 - 5.1 mmol/L    CHLORIDE 100 96 - 111 mmol/L    CO2 TOTAL 27 23 - 31 mmol/L    ANION GAP 6 4 - 13 mmol/L    CALCIUM 8.9 8.8 - 10.2 mg/dL    GLUCOSE 170 (H) 65 - 125 mg/dL    BUN 27 (H) 8 - 25 mg/dL    CREATININE 0.76 0.75 - 1.35 mg/dL    BUN/CREA RATIO 36 (H) 6 - 22    ESTIMATED GFR >90 >=60 mL/min/BSA    Narrative    Lipemia alters results at this level  (moderate). Repeat testing with new specimen suggested.  Lipemia can alter results at this level (moderate).  Lipemia can alter results at this level (moderate).  Hemolysis can alter results at this level (slight).  Lipemia alters results at this level (moderate). Repeat testing with new specimen suggested.  Lipemia alters results at this level (moderate). Repeat testing with new specimen suggested.   PHOSPHORUS   Result Value Ref Range    PHOSPHORUS 3.6 2.3 - 4.0 mg/dL    Narrative    Hemolysis can alter results at this level (slight).   MAGNESIUM   Result Value Ref Range    MAGNESIUM 2.0 1.8 - 2.6 mg/dL    Narrative    Hemolysis can alter results at this level (slight).  Lipemia alters results at this level (moderate). Repeat testing with new specimen suggested.   CBC   Result Value Ref Range    WBC 17.9 (H) 3.7 - 11.0 x10^3/uL    RBC 3.08 (L) 4.50 - 6.10 x10^6/uL    HGB 8.6 (L) 13.4 - 17.5 g/dL    HCT 27.1 (L) 38.9 - 52.0 %    MCV 88.0 78.0 - 100.0 fL    MCH 27.9 26.0 - 32.0 pg    MCHC 31.7 31.0 - 35.5 g/dL    RDW-CV 17.2 (H) 11.5 - 15.5 %    PLATELETS 434 (H) 150 - 400 x10^3/uL    MPV 12.6 (H) 8.7 - 12.5 fL   POC BLOOD GLUCOSE (RESULTS)   Result Value Ref Range    GLUCOSE, POC 182 (H) 70 - 105 mg/dl   POC BLOOD GLUCOSE (RESULTS)   Result Value Ref Range    GLUCOSE, POC 157 (H) 70 - 105 mg/dl   POC BLOOD GLUCOSE (RESULTS)   Result Value Ref Range    GLUCOSE, POC 127 (H) 70 - 105 mg/dl   POC BLOOD GLUCOSE (RESULTS)   Result Value Ref Range    GLUCOSE, POC 180 (H) 70 - 105 mg/dl   POC BLOOD GLUCOSE (RESULTS)   Result Value Ref Range    GLUCOSE, POC 166 (H) 70 - 105 mg/dl       I/O:  Date 05/18/21 0700 - 05/19/21  9509 05/19/21 0700 - 05/20/21 0659   Shift 0700-1459 1500-2259 2300-0659 24 Hour Total 3267-1245 1500-2259 2300-0659 24 Hour Total   INTAKE   P.O. 90 390  480         Oral 90 390  480       I.V.(mL/kg/hr) 55(0.1)   55         Volume Infused (adult custom variable rate parenteral nutrition) 55   55       IV  Piggyback  100 164 264         Volume (DAPTOmycin (CUBICIN) 700 mg in NS 50 mL IVPB)   64 64         Volume (meropenem (MERREM) 1 g in NS 100 mL IVPB minibag)  100 100 200       Shift Total(mL/kg) 145(2.01) 490(6.8) 164(2.27) 809(98.33)       OUTPUT   Urine(mL/kg/hr) 775(1.34) 1100(1.91) 950 2825         Urine (Voided) 775 1100 950 2825       Drains  70  70         Drain Output (Drain (Miscellaneous) peds ostomy bag Lower;Medial Abdomen)  70  70       Stool             Stool Occurrence 2 x 1 x  3 x       Shift Total(mL/kg) 775(10.75) 8250(53.97) 950(13.18) 6734(19.37)       Weight (kg) 72.1 72.1 72.1 72.1 72.1 72.1 72.1 72.1       Radiology:  05/18/21 CT C/A/P   1.Postsurgical changes from distal pancreatectomy and splenectomy with complex fluid in the surgical beds similar in size to prior exam however with improving internal air.  2.Midline abdominal wall incision with underlying fluid and air grossly unchanged from prior exam.  3.Persistent portion of the transverse colon abutting the ventral abdominal wall without frank communication identified.  4.Trace left pleural effusion with bibasilar atelectasis.  5.Punctate calcifications layering within the bladder.    Current Medications:  Current Facility-Administered Medications   Medication Dose Route Frequency   . acetaminophen (TYLENOL) tablet  500 mg Oral Q4H PRN   . adult custom variable rate parenteral nutrition   Intravenous Continuous   . atorvastatin (LIPITOR) tablet  40 mg Oral Daily with Breakfast   . benzonatate (TESSALON) capsule  100 mg Oral Q8H PRN   . D5W 250 mL flush bag   Intravenous Q15 Min PRN   . DAPTOmycin (CUBICIN) 700 mg in NS 50 mL IVPB  10 mg/kg Intravenous Q24H   . diatrizoate meglumine & sodium oral solution  8 mL Oral Give in Radiology   . elvitegravir-cobicistat-emtricitrabine-tenofovir (GENVOYA) 150-150-200-10 mg per tablet  1 Tablet Oral Daily   . gabapentin (NEURONTIN) capsule  300 mg Oral 3x/day   . heparin 25,000 units in NS 250 mL  infusion  18 Units/kg/hr (Adjusted) Intravenous Continuous   . insulin NPH human 100 units/mL injection  10 Units Subcutaneous Daily before Breakfast   . insulin NPH human 100 units/mL injection  30 Units Subcutaneous Daily after Dinner   . insulin R human (HUMULIN R) 100 units/mL injection - for tube feed coverage  10 Units Subcutaneous 3x/day   . melatonin tablet  6 mg Oral NIGHTLY   . meropenem (MERREM) 1 g in NS 100 mL IVPB minibag  1 g Intravenous Q8H   . mirtazapine (REMERON) tablet  7.5 mg Oral NIGHTLY   . multivitamin-minerals-folic acid-lycopene-lutein (CERTAVITE SENIOR) tablet  1 Tablet Oral Q24H   . NS 250 mL flush bag   Intravenous Q15 Min PRN   . NS flush syringe  10-30 mL Intracatheter Q8HRS   . NS flush syringe  20-30 mL Intracatheter Q1 MIN PRN   . NS flush syringe  2-6 mL Intracatheter Q8HRS   . NS flush syringe  2-6 mL Intracatheter Q1 MIN PRN   . oxyCODONE (ROXICODONE) immediate release tablet  2.5 mg Oral Q4H PRN   . oxyCODONE (ROXICODONE) immediate release tablet  5 mg Oral Q4H PRN   . pantoprazole (PROTONIX) delayed release tablet  40 mg Oral Q12H   . prochlorperazine (COMPAZINE) 5 mg/mL injection  10 mg Intravenous Q6H PRN   . promethazine (PHENERGAN) tablet  6.25 mg Oral Q6H PRN   . sennosides-docusate sodium (SENOKOT-S) 8.6-'50mg'$  per tablet  1 Tablet Oral 2x/day   . SSIP insulin R human (HUMULIN R) 100 units/mL injection  0-12 Units Subcutaneous Q6H PRN       Allene Pyo, PA-C  05/19/2021, 07:00

## 2021-05-19 NOTE — Nurses Notes (Addendum)
PTT 94.7, no change per protocol.  Recheck PTT order placed.     1600 PTT 97.6, no change per protocol.  Recheck PTT order placed.

## 2021-05-19 NOTE — Care Plan (Signed)
Medical Nutrition Therapy  I:  Consulted for calorie count    Energy Calorie Requirements: 2100-2300 cal (28-30 cal/75.9 kg)  Protein Requirements (gms/day): 88-110 g prot (1.2-1.5 g/73.7 kg)   Fluid Requirements: 2100-2300 ml (28-30 ml/75.9 k)     Current Diet order/nutrition support: Regular with ensure HP TID diet plus TPN   TPN  Cycled for 18 hours at night (55 ml the first and last hour and 110 ml x 16 hours)   2100 kcal /day = 28 kcal/kg  100 grams Protein = 1.4 grams/ kg  860 Dextrose kcals, GIR 2.3  840 IL kcals or 1.1 grams Fat/kg (40 % of total calories)     P:  Calorie count to run 4/19-4/21. Pt is receiving his preferred supplements.  To continue TPN at this time but will shorten cycle to 16 hours .  Results to follow    Dierdre Searles, RD, LD, [place] 05/19/2021, 08:50  Pager [phone]

## 2021-05-19 NOTE — Nurses Notes (Signed)
Latest Reference Range & Units Most Recent   aPTT 24.2 - 37.5 seconds 51.3 (H)  05/19/21 02:05   (H): Data is abnormally high    Result non therapeutic per  Heparin thrombotic Dosing chart. Rate increased to 25 U'kg'hr. IV Bolus of 0.4 ml of heparin given. APTT will be checked again after 6 hrs per protocol.

## 2021-05-19 NOTE — Care Plan (Signed)
[facility]  Occupational Therapy Progress Note    Patient Name: ADON GEHLHAUSEN  Date of Birth: [birth year]  Height:  175.3 cm (5' 9.02")  Weight:  72.1 kg (158 lb 15.2 oz)  Room/Bed: 961/A  Payor: [facility] [employer] / Plan: [place] [facility] / Product Type: Managed Care /     Assessment:    Mr. Furry performed well during OT intervention this date. Pt is completing functional bed mobility with CGA-min A to assist with BLE management and participated in transfer training within [facility] including 2 sets of 5 STS transfers with CGA provided for balance, steadying from elevated seat. Required min A and increased time to transfer to/from EOB surface. Seated UB grooming ADLs at sink level with setup A-CGA. Continues to be limited by weakness, impaired coordination, balance deficits, and limited activity tolerance. From OT perspective, continue to recommend acute rehab placement upon d/c once medically appropriate. OT will continue to follow.      Discharge Needs:   Equipment Recommendation: to be determined    Discharge Disposition:  inpatient rehabilitation facility     JUSTIFICATION OF DISCHARGE RECOMMENDATION   Based on current diagnosis, functional performance prior to admission, and current functional performance, this patient requires continued OT services in inpatient rehabilitation facility in order to achieve significant functional improvements.    Plan:   Continue to follow patient according to established plan of care.  The risks/benefits of therapy have been discussed with the patient/caregiver and he/she is in agreement with the established plan of care.     Subjective & Objective:      05/19/21 1413   Therapist Pager   OT Assigned/ Pager # Marjo Grosvenor [phone]   Rehab Session   Document Type therapy progress note (daily note)   Total OT Minutes: 20   Patient Effort good   Symptoms Noted During/After Treatment fatigue   General Information   Patient Profile  Reviewed yes   Onset of Illness/Injury or Date of Surgery 04/03/21   Medical Lines PICC Line;Peripheral Drain   Respiratory Status room air   Existing Precautions/Restrictions full code;fall precautions;contact isolation   Pre Treatment Status   Pre Treatment Patient Status Patient supine in bed;Call light within reach;Telephone within reach;Nurse approved session   Support Present Pre Treatment  Family present   Communication Pre Treatment  Nurse   Mutuality/Individual Preferences   Individualized Care Needs OOB with FWW and assist x2 vs. [facility]   Patient-Specific Goals (Include Timeframe) To get stronger   Plan of Care Reviewed With patient   Vital Signs   O2 Delivery Pre Treatment room air   O2 Delivery Post Treatment room air   Pain Assessment   Pretreatment Pain Rating 0/10 - no pain   Posttreatment Pain Rating 0/10 - no pain   Coping/Psychosocial   Observed Emotional State calm;cooperative   Verbalized Emotional State acceptance   Coping/Psychosocial Response Interventions   Plan Of Care Reviewed With patient;spouse   Cognitive Assessment/Interventions   Behavior/Mood Observations alert;behavior appropriate to situation, WNL/WFL;cooperative   Orientation Status oriented x 4   Attention WNL/WFL   Follows Commands follows two step commands   Mobility Assessment/Training   Mobility Comment Transfer training within [facility]   Bed Mobility Assessment/Treatment   Bed Mobility, Assistive Device Head of Bed Elevated   Supine-Sit Independence contact guard assist   Sit to Supine, Independence minimum assist (75% patient effort)   Safety Issues decreased use of arms for pushing/pulling;decreased use  of legs for bridging/pushing;impaired trunk control for bed mobility   Impairments balance impaired;endurance;postural control impaired;strength decreased   Transfer Assessment/Treatment   Sit-Stand Independence minimum assist (75% patient effort)   Stand-Sit Independence minimum assist (75% patient effort)    Sit-Stand-Sit, Assist Device [facility]   Transfer Impairments balance impaired;coordination impaired;endurance;postural control impaired;strength decreased   Transfer Comment Pt completed transfer training within [facility] with min A to transfer to/from EOB surface. Completed 2 sets of 5 STS transfers within [facility] with a seated rest break between sets from elevated bench and CGA for steadying, safety.   ADL Assessment/Intervention   ADL Comments grooming ADLs at sink level   Grooming Assessment/Training   Position supported sitting   Independence Level contact guard assist;set up required   Impairments activity tolerance impaired;balance impaired;strength decreased   Comment Oral hygiene while seated supported in [facility] with CGA provided for balance, steadying and setup A to gather materials.   Motor Skills/Interventions   Additional Documentation Functional Endurance (Group)   Balance Skill Training   Comment [facility]   Sitting Balance: Static fair + balance   Sitting, Dynamic (Balance) fair + balance   Sit-to-Stand Balance poor balance   Standing Balance: Static poor - balance   Standing Balance: Dynamic poor - balance   Systems Impairment Contributing to Balance Disturbance musculoskeletal   Identified Impairments Contributing to Balance Disturbance impaired coordination;impaired postural control;decreased strength   Functional Endurance Training   Comment, Functional Endurance fair   Post Treatment Status   Post Treatment Patient Status Patient supine in bed;Call light within reach;Telephone within reach   Support Present Post Treatment  Family present   Communication Post Treatement Nurse   Clinical Impression   Functional Level at Time of Session Mr. Deroos performed well during OT intervention this date. Pt is completing functional bed mobility with CGA-min A to assist with BLE management and participated in transfer training within [facility] including 2 sets of 5 STS transfers with CGA  provided for balance, steadying from elevated seat. Required min A and increased time to transfer to/from EOB surface. Seated UB grooming ADLs at sink level with setup A-CGA. Continues to be limited by weakness, impaired coordination, balance deficits, and limited activity tolerance. From OT perspective, continue to recommend acute rehab placement upon d/c once medically appropriate. OT will continue to follow.   Anticipated Equipment Needs at Discharge to be determined   Anticipated Discharge Disposition inpatient rehabilitation facility   Highest level of Mobility score   Exercise/Activity Level Performed 5- Static standing>1 minute         Therapist:   Frederika Hukill, MOT, OTR/L  Pager #: [phone]

## 2021-05-20 ENCOUNTER — Encounter (HOSPITAL_COMMUNITY)
Admission: AD | Disposition: A | Discharge status: Rehabilitation Facility | Payer: Self-pay | Source: Other Acute Inpatient Hospital | Attending: SURGICAL ONCOLOGY

## 2021-05-20 ENCOUNTER — Inpatient Hospital Stay (HOSPITAL_COMMUNITY): Payer: 59

## 2021-05-20 ENCOUNTER — Inpatient Hospital Stay (HOSPITAL_COMMUNITY): Payer: 59 | Admitting: Specialist

## 2021-05-20 ENCOUNTER — Encounter (HOSPITAL_COMMUNITY): Payer: Self-pay | Admitting: SURGICAL ONCOLOGY

## 2021-05-20 DIAGNOSIS — I10 Essential (primary) hypertension: Secondary | ICD-10-CM

## 2021-05-20 DIAGNOSIS — K863 Pseudocyst of pancreas: Secondary | ICD-10-CM

## 2021-05-20 DIAGNOSIS — E039 Hypothyroidism, unspecified: Secondary | ICD-10-CM

## 2021-05-20 DIAGNOSIS — R933 Abnormal findings on diagnostic imaging of other parts of digestive tract: Secondary | ICD-10-CM

## 2021-05-20 DIAGNOSIS — Z79899 Other long term (current) drug therapy: Secondary | ICD-10-CM

## 2021-05-20 DIAGNOSIS — E119 Type 2 diabetes mellitus without complications: Secondary | ICD-10-CM

## 2021-05-20 DIAGNOSIS — Z09 Encounter for follow-up examination after completed treatment for conditions other than malignant neoplasm: Secondary | ICD-10-CM

## 2021-05-20 DIAGNOSIS — E785 Hyperlipidemia, unspecified: Secondary | ICD-10-CM

## 2021-05-20 LAB — POC BLOOD GLUCOSE (RESULTS)
GLUCOSE, POC: 141 mg/dl — ABNORMAL HIGH (ref 70–105)
GLUCOSE, POC: 148 mg/dl — ABNORMAL HIGH (ref 70–105)
GLUCOSE, POC: 160 mg/dl — ABNORMAL HIGH (ref 70–105)
GLUCOSE, POC: 172 mg/dl — ABNORMAL HIGH (ref 70–105)
GLUCOSE, POC: 189 mg/dl — ABNORMAL HIGH (ref 70–105)
GLUCOSE, POC: 196 mg/dl — ABNORMAL HIGH (ref 70–105)
GLUCOSE, POC: 228 mg/dl — ABNORMAL HIGH (ref 70–105)

## 2021-05-20 LAB — MANUAL DIFF AND MORPHOLOGY-SYSMEX
BASOPHIL #: <0.1 10*3/uL (ref <=0.20)
BASOPHIL %: 0 %
EOSINOPHIL #: <0.1 10*3/uL (ref <=0.50)
EOSINOPHIL %: 0 %
LYMPHOCYTE #: 8.74 10*3/uL — ABNORMAL HIGH (ref 1.00–4.80)
LYMPHOCYTE %: 46 %
MONOCYTE #: 0.57 10*3/uL (ref 0.20–1.10)
MONOCYTE %: 3 %
NEUTROPHIL #: 9.69 10*3/uL — ABNORMAL HIGH (ref 1.50–7.70)
NEUTROPHIL %: 51 %
NRBC FROM MANUAL DIFF: 1 per 100 WBC

## 2021-05-20 LAB — CBC WITH DIFF
HCT: 28 % — ABNORMAL LOW (ref 38.9–52.0)
HGB: 8.8 g/dL — ABNORMAL LOW (ref 13.4–17.5)
MCH: 27.7 pg (ref 26.0–32.0)
MCHC: 31.4 g/dL (ref 31.0–35.5)
MCV: 88.1 fL (ref 78.0–100.0)
MPV: 12.6 fL — ABNORMAL HIGH (ref 8.7–12.5)
PLATELETS: 426 10*3/uL — ABNORMAL HIGH (ref 150–400)
RBC: 3.18 10*6/uL — ABNORMAL LOW (ref 4.50–6.10)
RDW-CV: 17.3 % — ABNORMAL HIGH (ref 11.5–15.5)
WBC: 19 10*3/uL — ABNORMAL HIGH (ref 3.7–11.0)

## 2021-05-20 LAB — BASIC METABOLIC PANEL
ANION GAP: 4 mmol/L (ref 4–13)
BUN/CREA RATIO: 33 — ABNORMAL HIGH (ref 6–22)
BUN: 26 mg/dL — ABNORMAL HIGH (ref 8–25)
CALCIUM: 8.9 mg/dL (ref 8.8–10.2)
CHLORIDE: 107 mmol/L (ref 96–111)
CO2 TOTAL: 28 mmol/L (ref 23–31)
CREATININE: 0.8 mg/dL (ref 0.75–1.35)
ESTIMATED GFR: >90 mL/min/BSA (ref >=60)
GLUCOSE: 148 mg/dL — ABNORMAL HIGH (ref 65–125)
POTASSIUM: 4.3 mmol/L (ref 3.5–5.1)
SODIUM: 139 mmol/L (ref 136–145)

## 2021-05-20 LAB — ADULT ROUTINE BLOOD CULTURE, SET OF 2 BOTTLES (BACTERIA AND YEAST): BLOOD CULTURE, ROUTINE: ABNORMAL — CR

## 2021-05-20 LAB — PHOSPHORUS: PHOSPHORUS: 3.3 mg/dL (ref 2.3–4.0)

## 2021-05-20 LAB — MAGNESIUM: MAGNESIUM: 2.3 mg/dL (ref 1.8–2.6)

## 2021-05-20 SURGERY — ENDOSCOPIC U/S WITH CYST GASTROSTOMY
Anesthesia: General | Site: Mouth | Wound class: Clean Contaminated Wounds-The respiratory, GI, Genital, or urinary

## 2021-05-20 MED ORDER — POTASSIUM ACETATE 2 MEQ/ML INTRAVENOUS SOLUTION
INTRAVENOUS | Status: AC
Start: 2021-05-20 — End: 2021-05-21
  Filled 2021-05-20: qty 1000

## 2021-05-20 MED ORDER — ONDANSETRON HCL (PF) 4 MG/2 ML INJECTION SOLUTION
4.0000 mg | Freq: Once | INTRAMUSCULAR | Status: DC | PRN
Start: 2021-05-20 — End: 2021-05-20

## 2021-05-20 MED ORDER — SUCCINYLCHOLINE 20 MG/ML INTRAVENOUS WRAPPER
INJECTION | Freq: Once | INTRAVENOUS | Status: DC | PRN
Start: 2021-05-20 — End: 2021-05-20
  Administered 2021-05-20: 100 mg via INTRAVENOUS

## 2021-05-20 MED ORDER — LIDOCAINE (PF) 20 MG/ML (2 %) INJECTION SOLUTION
INTRAMUSCULAR | Status: AC
Start: 2021-05-20 — End: 2021-05-20
  Filled 2021-05-20: qty 5

## 2021-05-20 MED ORDER — SUCCINYLCHOLINE 20 MG/ML INTRAVENOUS WRAPPER
INJECTION | INTRAVENOUS | Status: AC
Start: 2021-05-20 — End: 2021-05-20
  Filled 2021-05-20: qty 10

## 2021-05-20 MED ORDER — CALCIUM 200 MG (AS CALCIUM CARBONATE 500 MG) CHEWABLE TABLET
500.0000 mg | CHEWABLE_TABLET | Freq: Three times a day (TID) | ORAL | Status: DC | PRN
Start: 2021-05-20 — End: 2021-05-21
  Administered 2021-05-21: 500 mg via ORAL
  Filled 2021-05-20: qty 1

## 2021-05-20 MED ORDER — FENTANYL (PF) 50 MCG/ML INJECTION SOLUTION
12.5000 ug | INTRAMUSCULAR | Status: DC | PRN
Start: 2021-05-20 — End: 2021-05-20

## 2021-05-20 MED ORDER — LACTATED RINGERS INTRAVENOUS SOLUTION
INTRAVENOUS | Status: DC | PRN
Start: 2021-05-20 — End: 2021-05-20

## 2021-05-20 MED ORDER — LORAZEPAM 0.5 MG TABLET
0.2500 mg | ORAL_TABLET | Freq: Three times a day (TID) | ORAL | Status: DC | PRN
Start: 2021-05-20 — End: 2021-05-21
  Administered 2021-05-20 (×2): 0.25 mg via ORAL
  Filled 2021-05-20 (×2): qty 1

## 2021-05-20 MED ORDER — DEXAMETHASONE SODIUM PHOSPHATE (PF) 10 MG/ML INJECTION SOLUTION
4.0000 mg | Freq: Once | INTRAMUSCULAR | Status: DC | PRN
Start: 2021-05-20 — End: 2021-05-20

## 2021-05-20 MED ORDER — PROPOFOL 10 MG/ML INTRAVENOUS EMULSION
INTRAVENOUS | Status: AC
Start: 2021-05-20 — End: 2021-05-20
  Filled 2021-05-20: qty 20

## 2021-05-20 MED ORDER — PHENYLEPHRINE 1 MG/10 ML (100 MCG/ML) IN 0.9 % SOD.CHLORIDE IV SYRINGE
INJECTION | INTRAVENOUS | Status: AC
Start: 2021-05-20 — End: 2021-05-20
  Filled 2021-05-20: qty 10

## 2021-05-20 MED ORDER — LACTATED RINGERS INTRAVENOUS SOLUTION
INTRAVENOUS | Status: DC
Start: 2021-05-20 — End: 2021-05-21
  Administered 2021-05-21: 0 mL via INTRAVENOUS

## 2021-05-20 MED ORDER — SODIUM CHLORIDE 0.9 % (FLUSH) INJECTION SYRINGE
2.0000 mL | INJECTION | Freq: Three times a day (TID) | INTRAMUSCULAR | Status: DC
Start: 2021-05-20 — End: 2021-05-21
  Administered 2021-05-20: 2 mL
  Administered 2021-05-20: 0 mL
  Administered 2021-05-21: 2 mL

## 2021-05-20 MED ORDER — DEXTROSE 5% IN WATER (D5W) FLUSH BAG - 250 ML
INTRAVENOUS | Status: DC | PRN
Start: 2021-05-20 — End: 2021-05-21

## 2021-05-20 MED ORDER — SODIUM CHLORIDE 0.9 % (FLUSH) INJECTION SYRINGE
2.0000 mL | INJECTION | INTRAMUSCULAR | Status: DC | PRN
Start: 2021-05-20 — End: 2021-05-21

## 2021-05-20 MED ORDER — LIDOCAINE (PF) 100 MG/5 ML (2 %) INTRAVENOUS SYRINGE
INJECTION | Freq: Once | INTRAVENOUS | Status: DC | PRN
Start: 2021-05-20 — End: 2021-05-20
  Administered 2021-05-20: 100 mg via INTRAVENOUS

## 2021-05-20 MED ORDER — FENTANYL (PF) 50 MCG/ML INJECTION SOLUTION
25.0000 ug | INTRAMUSCULAR | Status: DC | PRN
Start: 2021-05-20 — End: 2021-05-20

## 2021-05-20 MED ORDER — SODIUM CHLORIDE 0.9% FLUSH BAG - 250 ML
INTRAVENOUS | Status: DC | PRN
Start: 2021-05-20 — End: 2021-05-21

## 2021-05-20 MED ORDER — INSULIN NPH ISOPHANE U-100 HUMAN 100 UNIT/ML SUBCUTANEOUS SUSPENSION
15.0000 [IU] | Freq: Once | SUBCUTANEOUS | Status: AC
  Administered 2021-05-20: 15 [IU] via SUBCUTANEOUS
  Filled 2021-05-20: qty 1000

## 2021-05-20 MED ORDER — VANCOMYCIN IV - PHARMACIST TO DOSE PER PROTOCOL - NO EXCLUSION CRITERIA
Freq: Every day | Status: DC | PRN
Start: 2021-05-20 — End: 2021-05-28

## 2021-05-20 MED ORDER — PROPOFOL 10 MG/ML IV BOLUS
INJECTION | Freq: Once | INTRAVENOUS | Status: DC | PRN
Start: 2021-05-20 — End: 2021-05-20
  Administered 2021-05-20: 70 mg via INTRAVENOUS

## 2021-05-20 MED ORDER — PHENYLEPHRINE 1 MG/10 ML (100 MCG/ML) IN 0.9 % SOD.CHLORIDE IV SYRINGE
INJECTION | Freq: Once | INTRAVENOUS | Status: DC | PRN
Start: 2021-05-20 — End: 2021-05-20
  Administered 2021-05-20 (×4): 100 ug via INTRAVENOUS

## 2021-05-20 MED ORDER — VANCOMYCIN 10 GRAM INTRAVENOUS SOLUTION
750.0000 mg | Freq: Two times a day (BID) | INTRAVENOUS | Status: DC
Start: 2021-05-20 — End: 2021-05-25
  Administered 2021-05-20: 0 mg via INTRAVENOUS
  Administered 2021-05-20 – 2021-05-21 (×2): 750 mg via INTRAVENOUS
  Administered 2021-05-21: 0 mg via INTRAVENOUS
  Administered 2021-05-21: 750 mg via INTRAVENOUS
  Administered 2021-05-21: 0 mg via INTRAVENOUS
  Administered 2021-05-22 (×2): 750 mg via INTRAVENOUS
  Administered 2021-05-22: 0 mg via INTRAVENOUS
  Administered 2021-05-22: 750 mg via INTRAVENOUS
  Administered 2021-05-22: 0 mg via INTRAVENOUS
  Administered 2021-05-22 – 2021-05-23 (×2): 750 mg via INTRAVENOUS
  Administered 2021-05-23: 0 mg via INTRAVENOUS
  Administered 2021-05-23: 750 mg via INTRAVENOUS
  Administered 2021-05-23 – 2021-05-24 (×2): 0 mg via INTRAVENOUS
  Administered 2021-05-24: 750 mg via INTRAVENOUS
  Administered 2021-05-24: 0 mg via INTRAVENOUS
  Administered 2021-05-24 – 2021-05-25 (×3): 750 mg via INTRAVENOUS
  Administered 2021-05-25: 0 mg via INTRAVENOUS
  Filled 2021-05-20 (×10): qty 7.5

## 2021-05-20 SURGICAL SUPPLY — 11 items
DILATOR ENDOS CRE 240CM 5.5CM 18-19-20MM 7.5FR ESOPH PYL BIL BAL LOW PROF GW PEBAX STRL LF  DISP 2.8 (ENDOSCOPIC SUPPLIES) ×2 IMPLANT
DILATOR ENDOS CRE 240CM 5.5CM_18-19-20MM 7.5FR ESOPH PYL BIL (INSTRUMENTS ENDOMECHANICAL) ×2
ELECTRODE PATIENT RTN 9FT VLAB C30- LB RM PHSV ACRL FOAM CORD NONIRRITATE NONSENSITIZE ADH STRP (SURGICAL CUTTING SUPPLIES) ×1 IMPLANT
ELECTRODE PATIENT RTN 9FT VLAB_REM C30- LB PLHSV ACRL FOAM (CUTTING ELEMENTS) ×1
KIT ENDO (INSTRUMENTS ENDOMECHANICAL) ×1
KIT ENDOS CMPLN ENDOKIT ORCAPOD 4 1.1OZ (ENDOSCOPIC SUPPLIES) ×1 IMPLANT
SNARE SPRL 230CM 2.8MM 20MM SNAREMASTER RIDGE WRE ENDOS PLPCTM STRL LF  DISP (ENDOSCOPIC SUPPLIES) ×1 IMPLANT
SNARE SPRL 230CM 2.8MM 20MM SN_AREMASTER RIDGE WRE ENDOS (INSTRUMENTS ENDOMECHANICAL) ×1
STENT AXS 10.8FR 146CM PANC DEL SYS PRELD PERPENDICULAR FLANGE LUM METAL (STENTS GASTROINTESTINAL) ×1 IMPLANT
SYRINGE INFLAT ALN II GA STRL DISP 60ML (ENDOSCOPIC SUPPLIES) ×1 IMPLANT
SYRINGE INFLAT ALN II GA STRL_DISP 60ML (INSTRUMENTS ENDOMECHANICAL) ×1

## 2021-05-20 NOTE — Nurses Notes (Signed)
Per Surg/Onc Mallory, MD.  1. Pt IS to receive 15 units of NPH  AND  2. Pt IS NOT to receive 8 units of HUMULIN R

## 2021-05-20 NOTE — Care Plan (Signed)
Medical Nutrition Therapy Calorie Count Note  I: Estimated Needs:  Energy Calorie Requirements: 0383-3383 cal (28-30 cal/75.9 kg)  Protein Requirements (gms/day): 88-110 g prot (1.2-1.5 g/73.7 kg)  Fluid Requirements: 2100-2300 ml (28-30 ml/75.9 k)    Calorie Count day 1: 573 calories, 37 grams of protein, which met 25-27 % of estimated calorie needs and 34-42 % of estimated protein needs. Pt consumed 2 ensures providing 320 calories.    Please document % intake of all food, drinks and dietary supplements to help determine appropriate nutrition intervention.     Will extend through 4/22 as pt is NPO today for a procedure.    Tracey Harries, NDTR  05/20/2021, 10:32  Pager#[phone]

## 2021-05-20 NOTE — Care Plan (Signed)
Problem: Adult Inpatient Plan of Care  Goal: Plan of Care Review  Outcome: Ongoing (see interventions/notes)  Goal: Patient-Specific Goal (Individualized)  Outcome: Ongoing (see interventions/notes)  Flowsheets (Taken 05/20/2021 0915)  Individualized Care Needs: empathetic listening and reassurance  Anxieties, Fears or Concerns: Wants to get procedure done immediately  Goal: Absence of Hospital-Acquired Illness or Injury  Outcome: Ongoing (see interventions/notes)  Intervention: Identify and Manage Fall Risk  Recent Flowsheet Documentation  Taken 05/20/2021 0915 by Bertram Millard, RN  Safety Promotion/Fall Prevention: activity supervised  Intervention: Prevent Skin Injury  Recent Flowsheet Documentation  Taken 05/20/2021 0915 by Bertram Millard, RN  Skin Protection:   adhesive use limited   antimicrobial wipes   incontinence pads utilized   tubing/devices free from skin contact  Intervention: Prevent and Manage VTE (Venous Thromboembolism) Risk  Recent Flowsheet Documentation  Taken 05/20/2021 0915 by Bertram Millard, RN  VTE Prevention/Management:   ambulation promoted   sequential compression devices on   dorsiflexion/plantar flexion performed  Intervention: Prevent Infection  Recent Flowsheet Documentation  Taken 05/20/2021 0915 by Bertram Millard, RN  Infection Prevention:   barrier precautions utilized   rest/sleep promoted   equipment surfaces disinfected  Goal: Optimal Comfort and Wellbeing  Outcome: Ongoing (see interventions/notes)  Intervention: Provide Person-Centered Care  Recent Flowsheet Documentation  Taken 05/20/2021 0915 by Bertram Millard, RN  Trust Relationship/Rapport:   empathic listening provided   reassurance provided   thoughts/feelings acknowledged   questions answered   choices provided  Goal: Rounds/Family Conference  Outcome: Ongoing (see interventions/notes)

## 2021-05-20 NOTE — Consults (Signed)
[facility]  Endocrinology Consult  Follow Up Note    [name], [name], 76 y.o. male  Date of Service: 05/20/2021  Date of Birth:  08/18/1945    Hospital Day:  LOS: 47 days     Reason for Consult: : Diabetes management     [name] is a 76 y.o. 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      Subjective:   Patient seen at bedside , has just gotten back from his endoscopic procedure, sleepy and tired and per wife does nor seem like he would be eating any of his meals .  Plan to continue the TPN as per prior schedule from 8 pm to 12 pm next day .      Current Facility-Administered Medications   Medication Dose Route Frequency Provider Last Rate Last Admin   . acetaminophen (TYLENOL) tablet  500 mg Oral Q4H PRN Donna Christen, MD   500 mg at 05/19/21 1053   . adult custom variable rate parenteral nutrition   Intravenous Continuous Allene Pyo, PA-C 125 mL/hr at 05/20/21 1245 Solution Verified at 05/20/21 0727   . atorvastatin (LIPITOR) tablet  40 mg Oral Daily with Breakfast Nicola Police, MD   40 mg at 05/19/21 0854   . benzonatate (TESSALON) capsule  100 mg Oral Q8H PRN [name], MD       . D5W 250 mL flush bag   Intravenous Q15 Min PRN Modena Jansky, DO       . DAPTOmycin (CUBICIN) 700 mg in NS 50 mL IVPB  10 mg/kg Intravenous Q24H Mazzella, Leanne, PA-C   Stopped at 05/20/21 0707   . diatrizoate meglumine & sodium oral solution  8 mL Oral Give in Radiology Orlie Dakin, MD       . elvitegravir-cobicistat-emtricitrabine-tenofovir (GENVOYA) 150-150-200-10 mg per tablet  1 Tablet Oral Daily Monia Pouch, DO   1 Tablet at 05/19/21 [phone]   . gabapentin (NEURONTIN) capsule  300 mg Oral 3x/day Mariea Clonts, MD   300 mg at 05/19/21 2204   . heparin 25,000 units in NS 250 mL infusion  18 Units/kg/hr (Adjusted) Intravenous Continuous Lucile Crater, MD   Stopped at 05/20/21 8338   . insulin NPH human 100 units/mL injection  30 Units Subcutaneous Daily after [place] Meek, Dennis Bast, MD   30 Units at 05/19/21 2037   . insulin NPH human 100 units/mL injection  15 Units Subcutaneous Daily before Breakfast Sadiq, Mehjabeen, MD       . insulin R human (HUMULIN R) 100 units/mL injection - for tube feed coverage  8 Units Subcutaneous 3x/day Malena Peer, MD   8 Units at 05/20/21 0246   . LORazepam (ATIVAN) tablet  0.25 mg Oral Q8H PRN Mariea Clonts, MD   0.25 mg at 05/20/21 2505   . melatonin tablet  6 mg Oral NIGHTLY Donna Christen, MD   6 mg at 05/19/21 2026   . meropenem (MERREM) 1 g in NS 100 mL IVPB minibag  1 g Intravenous Q8H Nicola Police, MD 33 mL/hr at 05/20/21 0528 1 g at 05/20/21 0528   . mirtazapine (REMERON) tablet  7.5 mg Oral NIGHTLY Donna Christen, MD   7.5 mg at 05/19/21 2026   .  multivitamin-minerals-folic acid-lycopene-lutein (CERTAVITE SENIOR) tablet  1 Tablet Oral Q24H Cosner, Adam, PA-C   1 Tablet at 05/19/21 1331   . NS 250 mL flush bag   Intravenous Q15 Min PRN Modena Jansky, DO 150 mL/hr at 05/20/21 0636 20 mL at 05/20/21 0636   . NS flush syringe  10-30 mL Intracatheter Q8HRS Nicola Police, MD   30 mL at 05/20/21 0534   . NS flush syringe  20-30 mL Intracatheter Q1 MIN PRN Nicola Police, MD       . NS flush syringe  2-6 mL Intracatheter Q8HRS Modena Jansky, DO   2 mL at 05/20/21 0600   . NS flush syringe  2-6 mL Intracatheter Q1 MIN PRN Modena Jansky, DO       . oxyCODONE (ROXICODONE) immediate release tablet  2.5 mg Oral Q4H PRN Stacey Drain, DO   2.5 mg at 05/10/21 1400   . oxyCODONE (ROXICODONE) immediate release tablet  5 mg Oral Q4H PRN Stacey Drain, DO   5 mg at 05/19/21 1451   . pantoprazole (PROTONIX) delayed release tablet  40 mg Oral  Q12H [name], MD   40 mg at 05/20/21 0522   . prochlorperazine (COMPAZINE) 5 mg/mL injection  10 mg Intravenous Q6H PRN Mariea Clonts, MD       . promethazine [facility]) tablet  6.25 mg Oral Q6H PRN Lucile Crater, MD   6.25 mg at 05/20/21 0522   . sennosides-docusate sodium (SENOKOT-S) 8.6-'50mg'$  per tablet  1 Tablet Oral 2x/day Cosner, Adam, PA-C   1 Tablet at 05/19/21 2026   . SSIP insulin R human (HUMULIN R) 100 units/mL injection  0-12 Units Subcutaneous Q6H PRN Malena Peer, MD   2 Units at 05/20/21 0641     Objective:  Temperature: 37.1 C (98.8 F)  Heart Rate: (!) 103 (rn notified)  BP (Non-Invasive): 136/69  Respiratory Rate: 16  SpO2: (!) 89 % (rn notified)     General: Chronically ill appearing male in no acute distress.  HEENT: Head normocephalic, atraumatic. Conjunctiva clear. No proptosis or lid lag. Oral mucosa pink and moist.   Thyroid/Neck: No thyromegaly. Trachea midline.  Lungs: Normal respiratory effort.   Cardiac: Regular rate and rhythm.  Abdomen: Soft, non-tender, non-distended. Ostomy in place   Extremities: No edema, erythema, warmth, or tenderness.  Skin: Warm and dry. No visible rashes.  Neuro: sleepy ,no obvious focal neurologic deficit   Psychiatric: Normal mood and affect.      Lab Results   Component Value Date    WBC 19.0 (H) 05/20/2021    RBC 3.18 (L) 05/20/2021    HGB 8.8 (L) 05/20/2021    HCT 28.0 (L) 05/20/2021    MCV 88.1 05/20/2021    MCH 27.7 05/20/2021    MCHC 31.4 05/20/2021     Lab Results   Component Value Date    SODIUM 139 05/20/2021    POTASSIUM 4.3 05/20/2021    CO2 28 05/20/2021    BUN 26 (H) 05/20/2021    CREATININE 0.80 05/20/2021    GFR >90 05/20/2021    CALCIUM 8.9 05/20/2021    ALBUMIN 2.0 (L) 05/10/2021    ALKPHOS 80 05/17/2021    ALT 27 05/17/2021    AST 15 05/17/2021    ANIONGAP 4 05/20/2021     Lab Results   Component Value Date    CHOLESTEROL 212 (H) 12/21/2020    HDLCHOL 44 12/21/2020    LDLCHOL 96 12/21/2020    LDLCHOLDIR  89 03/24/2020     TRIG 132 05/17/2021      Lab Results   Component Value Date    HA1C 9.3 (H) 03/17/2021         Recent Labs     05/18/21  0807 05/18/21  1458 05/18/21  2012 05/19/21  0207 05/19/21  0641 05/19/21  0900 05/19/21  1220 05/19/21  1316 05/19/21  1807 05/19/21  2044 05/20/21  0105 05/20/21  0241 05/20/21  0502 05/20/21  0618   GLUIP 182* 157* 127* 180* 166* 230* 212* 217* 127* 177* 160* 172* 148* 189*       Assessment/Recommendations:  JIHAN RUDY is a 76 y.o. 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 Endocrinology is consulted for management of diabetes.     Post Pancreatectomy Diabetes Mellitus-improved control   - Most recent HbA1c on 03/17/2021 was 9.3%.  - Home regimen: Lantus 11 units nightly, Novolog 4 units TID AC.   - Current inpatient regimen:   Insulin NPH 15 /30   units bid, Humulin R 10     units Q6 hours, scheduled with TPN  and Humulin R conservative  sliding scale 4x/day PRN.   -Diet: MNT PROTOCOL FOR DIETITIAN  DIETARY ORAL SUPPLEMENTS Oral Supplements with tray: Ensure High Protein-Vanilla; BREAKFAST/LUNCH/DINNER; 1 Each  DIET NPO - SPECIFIC DATE & TIME  adult custom variable rate parenteral nutrition  adult custom variable rate parenteral nutrition   Currently on TPN to run from 8 pm to next day 12 pm for 16 hrs    Patient has not  Received his insulin R in morning as he was off floor and NPO for endoscopic procedure .  NPH dose was given per chart .   .     - Recommendations:  Since he is T1DM, will not hold NPH .  eventually when he is off TPN will switch him to Lantus .  Continue with NPH 15/30 units in morning and  night time .    Continue with the insulin R to 8 units with TPN , will keep schedule at 8 pm , 2 am and 8 am while on 16 hr TPN     Continue  with insulin R every 6 hrs PRN per conservative scale .    Patient today does not feel like eating after procedure will be continued on TPN , will monitor his sugars and if need be will schedule some meal time insulin per his requirements .      Will assess Insulin requirements and make changes. Please let us know if any changes made to TPN .      Thank you for this consult. Will continue to follow. Please page with questions.    Malena Peer  Endocrine fellow    05/20/2021 07:42  Department of Endocrine, Diabetes and metabolism            05/20/2021  I saw and examined the patient.  I reviewed the fellow's note.  I agree with the findings and plan of care as documented in the fellow's note.  Any exceptions/additions are edited/noted.    Venancio Poisson, MD 05/20/2021, 19:08  Assistant Professor  Endocrinology & Metabolism  [facility]

## 2021-05-20 NOTE — Progress Notes (Signed)
[facility]  Surgical Oncology  Progress Note      [name], [name], 76 y.o. male  Date of Birth:  05-Jun-1945  Date of Admission:  04/03/2021  Date of service: 05/20/2021    Assessment:  This is a 76 y.o. 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 He underwent massive transfusion protocol and ex lap on 04/03/21 at an outside facility with ligation of a bleeding vessel in the liver bed. Patient was subsequently transferred to [facility] postoperaively, intubated. Patient was subsequently extubated later that day on 3/4. 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 He failed advancing his diet to fulls the week of 4/08, backed off to TPN / clears after he developed severe nausea.    Plan/Recommendations:  - CT A/P with IV and oral contrast   Stable LUQ collection   - Positive blood culture    - One of two bottles positive for GPCs   - Repeat blood cultures 05/17/21 NGTD   - Continue antibiotics at this time, deescalate when able (currently on daptomycin and merrem)   - Appreciate ID recs  - Nausea:   Keep PRN Zofran & PRN Phenergan PO  - Anticipatory n/v and anxiety: ativan 0.'25mg'$  Q8 prn  - Heparin gtt held prior to GI procedure today  - Diet: NPO in preparation for endoscopy  - Peripancreatic fluid collections   - Advanced GI performed EUS with cystogastrostomy 4/6              - Blood cultures 3/9: + pseudomonas    - Cultures form R JP: Pseudomonas and enterococcus              - ID consulted; Daptomycin and meropenum  - PE   - s/p IVC filter placement 3/17  - Midline fistula with ostomy bag to monitor output  - Appreciate endocrine recs regarding glucose control  - OOB TID, encourage ambulation  -  Pulmonary toilet, keep IS accessible at bedside  - PT/OT ordered, encourage pt to work with therapy  - Dispo: Floor status.      Subjective:   Pt seen in NAD. He states that he is very nervous for his GI procedure today and finds it hard to wait for his procedure time. He endorses nausea overnight and this morning. Reports no acute concerns.     Objective  Filed Vitals:    05/19/21 1645 05/19/21 1927 05/19/21 2338 05/20/21 0719   BP: 131/82 124/70 (!) 144/74 136/69   Pulse: 98 100 100 (!) 103   Resp: '18 16 16 16   '$ Temp: 36.7 C (98.1 F) 37 C (98.6 F) 36.8 C (98.2 F) 37.1 C (98.8 F)   SpO2: 91% 90% 91% (!) 89%     Physical Exam:   GEN:  AOx4, resting in bed, no acute distress  PULM: Normal respiratory effort. Equal/symmetric chest rise.  CV:  Pink, well perfused, RR  ABD:   Abdomen soft, nontender, nondistended. Lower midline incision with Eakins pouch with minimal thin output  MS: Atraumatic. Moves all extremities.  NEURO:   Alert and oriented to person, place and time. Cranial nerves grossly intact. Responds to all questions appropriately  Integumentary:  Pink, warm, and  dry  PSYCHOSOCIAL: Anxious but cooperative. Normal affect.     Labs  Results for orders placed or performed during the hospital encounter of 04/03/21 (from the past 24 hour(s))   PTT (PARTIAL THROMBOPLASTIN TIME)   Result Value Ref Range    APTT 94.7 (H) 24.2 - 37.5 seconds    Narrative    Therapeutic range for unfractionated heparin is 60-100 seconds.   PTT (PARTIAL THROMBOPLASTIN TIME) - ONCE   Result Value Ref Range    APTT 97.6 (H) 24.2 - 37.5 seconds    Narrative    Therapeutic range for unfractionated heparin is 60-100 seconds.   PTT (PARTIAL THROMBOPLASTIN TIME) - ONCE   Result Value Ref Range    APTT 94.4 (H) 24.2 - 37.5 seconds    Narrative    Therapeutic range for unfractionated heparin is 60-100 seconds.   CBC/DIFF    Narrative    The following orders were created for panel order CBC/DIFF.  Procedure                                Abnormality         Status                     ---------                               -----------         ------                     CBC WITH WCHE[[id number]]                Abnormal            Final result               MANUAL DIFF AND MORPHOLO.Marland KitchenMarland Kitchen[[id number]]  Abnormal            Final result                 Please view results for these tests on the individual orders.   BASIC METABOLIC PANEL   Result Value Ref Range    SODIUM 139 136 - 145 mmol/L    POTASSIUM 4.3 3.5 - 5.1 mmol/L    CHLORIDE 107 96 - 111 mmol/L    CO2 TOTAL 28 23 - 31 mmol/L    ANION GAP 4 4 - 13 mmol/L    CALCIUM 8.9 8.8 - 10.2 mg/dL    GLUCOSE 148 (H) 65 - 125 mg/dL    BUN 26 (H) 8 - 25 mg/dL    CREATININE 0.80 0.75 - 1.35 mg/dL    BUN/CREA RATIO 33 (H) 6 - 22    ESTIMATED GFR >90 >=60 mL/min/BSA    Narrative    Lipemia alters results at this level (moderate). Repeat testing with new specimen suggested.  Lipemia can alter results at this level (moderate).  Lipemia can alter results at this level (moderate).  Hemolysis can alter results at this level (slight).  Lipemia alters results at this level (moderate). Repeat testing with new specimen suggested.  Lipemia alters results at this level (moderate). Repeat testing with new specimen suggested.   MAGNESIUM   Result Value Ref Range    MAGNESIUM 2.3 1.8 - 2.6 mg/dL    Narrative    Hemolysis can alter results at this  level (slight).  Lipemia alters results at this level (moderate). Repeat testing with new specimen suggested.   PHOSPHORUS   Result Value Ref Range    PHOSPHORUS 3.3 2.3 - 4.0 mg/dL    Narrative    Hemolysis can alter results at this level (slight).   CBC WITH DIFF   Result Value Ref Range    WBC 19.0 (H) 3.7 - 11.0 x10^3/uL    RBC 3.18 (L) 4.50 - 6.10 x10^6/uL    HGB 8.8 (L) 13.4 - 17.5 g/dL    HCT 28.0 (L) 38.9 - 52.0 %    MCV 88.1 78.0 - 100.0 fL    MCH 27.7 26.0 - 32.0 pg    MCHC 31.4 31.0 - 35.5 g/dL    RDW-CV 17.3 (H) 11.5 - 15.5 %    PLATELETS 426 (H) 150 - 400 x10^3/uL    MPV  12.6 (H) 8.7 - 12.5 fL    Narrative    White Blood CountResults reviewed .   MANUAL DIFF AND MORPHOLOGY-SYSMEX   Result Value Ref Range    NEUTROPHIL % 51 %    LYMPHOCYTE %  46 %    MONOCYTE % 3 %    EOSINOPHIL % 0 %    BASOPHIL % 0 %    NEUTROPHIL # 9.69 (H) 1.50 - 7.70 x10^3/uL    LYMPHOCYTE # 8.74 (H) 1.00 - 4.80 x10^3/uL    MONOCYTE # 0.57 0.20 - 1.10 x10^3/uL    EOSINOPHIL # <0.10 <=0.50 x10^3/uL    BASOPHIL # <0.10 <=0.20 x10^3/uL    NRBC FROM MANUAL DIFF 1 per 100 WBC    ANISOCYTOSIS 2+/Moderate (A) None   POC BLOOD GLUCOSE (RESULTS)   Result Value Ref Range    GLUCOSE, POC 230 (H) 70 - 105 mg/dl   POC BLOOD GLUCOSE (RESULTS)   Result Value Ref Range    GLUCOSE, POC 212 (H) 70 - 105 mg/dl   POC BLOOD GLUCOSE (RESULTS)   Result Value Ref Range    GLUCOSE, POC 217 (H) 70 - 105 mg/dl   POC BLOOD GLUCOSE (RESULTS)   Result Value Ref Range    GLUCOSE, POC 127 (H) 70 - 105 mg/dl   POC BLOOD GLUCOSE (RESULTS)   Result Value Ref Range    GLUCOSE, POC 177 (H) 70 - 105 mg/dl   POC BLOOD GLUCOSE (RESULTS)   Result Value Ref Range    GLUCOSE, POC 160 (H) 70 - 105 mg/dl   POC BLOOD GLUCOSE (RESULTS)   Result Value Ref Range    GLUCOSE, POC 172 (H) 70 - 105 mg/dl   POC BLOOD GLUCOSE (RESULTS)   Result Value Ref Range    GLUCOSE, POC 148 (H) 70 - 105 mg/dl   POC BLOOD GLUCOSE (RESULTS)   Result Value Ref Range    GLUCOSE, POC 189 (H) 70 - 105 mg/dl       I/O:  Date 05/19/21 0700 - 05/20/21 0659 05/20/21 0700 - 05/21/21 0659   Shift 0700-1459 1500-2259 2300-0659 24 Hour Total 0700-1459 1500-2259 2300-0659 24 Hour Total   INTAKE   P.O. 237 437 240 914         Oral  200 240 440         Supplement Volume 237 237  474       I.V.(mL/kg/hr) 1900(3.29)   1900(1.1)         Volume Infused (adult custom variable rate parenteral nutrition) 1900   1900       IV  Piggyback 100  60 160         Volume (DAPTOmycin (CUBICIN) 700 mg in NS 50 mL IVPB)   60 60         Volume (meropenem (MERREM) 1 g in NS 100 mL IVPB minibag) 100   100        Shift Total(mL/kg) 1025(85.27) 437(6.06) 300(4.16) 7824(23.53)       OUTPUT   Urine(mL/kg/hr) 1400(2.43) 850(1.47) 1200(2.08) 3450(1.99) 150   150     Urine (Voided) 1400 850 1200 3450 150   150   Drains 75  30 105         Drain Output (Drain (Miscellaneous) peds ostomy bag Lower;Medial Abdomen) 75  30 105       Shift Total(mL/kg) 1475(20.46) 850(11.79) 1230(17.06) 6144(31.54) 150(2.08)   150(2.08)   Weight (kg) 72.1 72.1 72.1 72.1 72.1 72.1 72.1 72.1       Radiology:  05/18/21 CT C/A/P   1.Postsurgical changes from distal pancreatectomy and splenectomy with complex fluid in the surgical beds similar in size to prior exam however with improving internal air.  2.Midline abdominal wall incision with underlying fluid and air grossly unchanged from prior exam.  3.Persistent portion of the transverse colon abutting the ventral abdominal wall without frank communication identified.  4.Trace left pleural effusion with bibasilar atelectasis.  5.Punctate calcifications layering within the bladder.    Current Medications:  Current Facility-Administered Medications   Medication Dose Route Frequency   . acetaminophen (TYLENOL) tablet  500 mg Oral Q4H PRN   . adult custom variable rate parenteral nutrition   Intravenous Continuous   . atorvastatin (LIPITOR) tablet  40 mg Oral Daily with Breakfast   . benzonatate (TESSALON) capsule  100 mg Oral Q8H PRN   . D5W 250 mL flush bag   Intravenous Q15 Min PRN   . DAPTOmycin (CUBICIN) 700 mg in NS 50 mL IVPB  10 mg/kg Intravenous Q24H   . diatrizoate meglumine & sodium oral solution  8 mL Oral Give in Radiology   . elvitegravir-cobicistat-emtricitrabine-tenofovir (GENVOYA) 150-150-200-10 mg per tablet  1 Tablet Oral Daily   . gabapentin (NEURONTIN) capsule  300 mg Oral 3x/day   . heparin 25,000 units in NS 250 mL infusion  18 Units/kg/hr (Adjusted) Intravenous Continuous   . insulin NPH human 100 units/mL injection  30 Units Subcutaneous Daily after Dinner   . insulin NPH human 100  units/mL injection  15 Units Subcutaneous Daily before Breakfast   . insulin R human (HUMULIN R) 100 units/mL injection - for tube feed coverage  8 Units Subcutaneous 3x/day   . LORazepam (ATIVAN) tablet  0.25 mg Oral Q8H PRN   . melatonin tablet  6 mg Oral NIGHTLY   . meropenem (MERREM) 1 g in NS 100 mL IVPB minibag  1 g Intravenous Q8H   . mirtazapine (REMERON) tablet  7.5 mg Oral NIGHTLY   . multivitamin-minerals-folic acid-lycopene-lutein (CERTAVITE SENIOR) tablet  1 Tablet Oral Q24H   . NS 250 mL flush bag   Intravenous Q15 Min PRN   . NS flush syringe  10-30 mL Intracatheter Q8HRS   . NS flush syringe  20-30 mL Intracatheter Q1 MIN PRN   . NS flush syringe  2-6 mL Intracatheter Q8HRS   . NS flush syringe  2-6 mL Intracatheter Q1 MIN PRN   . oxyCODONE (ROXICODONE) immediate release tablet  2.5 mg Oral Q4H PRN   . oxyCODONE (ROXICODONE) immediate release tablet  5 mg Oral Q4H PRN   .  pantoprazole (PROTONIX) delayed release tablet  40 mg Oral Q12H   . prochlorperazine (COMPAZINE) 5 mg/mL injection  10 mg Intravenous Q6H PRN   . promethazine (PHENERGAN) tablet  6.25 mg Oral Q6H PRN   . sennosides-docusate sodium (SENOKOT-S) 8.6-'50mg'$  per tablet  1 Tablet Oral 2x/day   . SSIP insulin R human (HUMULIN R) 100 units/mL injection  0-12 Units Subcutaneous Q6H PRN       Donna Christen, MD  General Surgery // PGY-1  [place]

## 2021-05-20 NOTE — Pharmacy (Addendum)
[place] / Department of Pharmaceutical Services  Therapeutic Drug Monitoring: Vancomycin  05/20/2021      Patient name: [name], [name]  Date of Birth:  04/15/1945    Actual Weight:  Weight: 72.1 kg (158 lb 15.2 oz) (05/20/21 1036)     BMI:  BMI (Calculated): 23.51 (05/20/21 1036)      Date RPh Current regimen (including mg/kg) Indication &  Organism AUC or trough based dosing Target Levels^ SCr (mg/dL) CrCl* (mL/min) Infectious Laboratory Markers (as applicable)   Measured level(s)   (mcg/mL) Calculated AUC (if AUC based monitoring) Plan & predicted AUC/trough if initial dosing (including when levels are due) Comments   3/9 Cassandr Cederberg Initiating Intra-abdominal AUC 400-600 0.69 92.5 WBC: 35.5     - Initiate vancomycin 1g q12h (~13 mg/kg)   - predicted AUC 453  - levels in 48 hrs if continued  - MRSA swab ordered in case of concern for pneumonia    03/11 1154 kop Vancomycin 1gm q12h IAB/bacteremia AUC  0.8 ~92 34.0 Pk 20.9 (ext to 22.5)  Tr 12.0 (ext to 11.95) 402 -AUC in low end of acceptable range  -SCR has increased slightly so will continue same dose for now to avoid accummulation - cefepime increased to q8h per ID rec-growing PSA in blood  -monitor renal closely  +flagyl   3/13 Jesica Goheen          - Vancomycin discontinued, switched to daptomycin                   4/20 Ivanna Kocak Re-initiating Intra-abdominal infection/bacteremia (E. Faecium) AUC 400-600 0.8 79.8 WBC: 19.0   - Reinitiate vancomycin 750 mg q12h (~10 mg/kg)  - predicted AUC 421  - Levels after 4th/before 5th dose                                      ^Target levels depends on dosing and monitoring method, AUC vs. trough based. For AUC based dosing units are mg*h/L. For trough based dosing units are mcg/mL.     *Creatinine clearance is estimated by using the Cockcroft-Gault equation for adult patients and the Carol Ada for pediatric patients.    The decision to discontinue vancomycin therapy will be determined by the primary service.  Please  contact the pharmacist with any questions regarding this patient's medication regimen.

## 2021-05-20 NOTE — Nurses Notes (Addendum)
[name]  Patient is requesting we can move his procedure today as early as possible. He stated that he's not feeling good, shaking and nauseated. His FS: 148. Just want to let you know. thanks. #[phone]. Service Gilmore Laroche paged to notify.    0700: Spoke to service Dr. Sherrine Maples to verify if scheduled NPH should be given since patient NPO, as per service she advised not to give it at this time and stated that she will ask and take a look on the patient notes then she will call back if it needs to be given.

## 2021-05-20 NOTE — Anesthesia Preprocedure Evaluation (Signed)
ANESTHESIA PRE-OP EVALUATION  Philippa Sicks  Planned Procedure: ENDOSCOPIC U/S WITH CYST GASTROSTOMY (Mouth)  Review of Systems    PONV       patient summary reviewed  nursing notes reviewed        Pulmonary  negative pulmonary ROS,    Cardiovascular    Hypertension, ECG reviewed, 3/23 echo  Left Ventricle: Normal left ventricular size. Concentric remodeling. Ejection Fraction is 62.6 %. Left ventricular systolic function is  normal. No segmental/regional wall motion abnormalities identified. Left ventricular diastolic parameters are normal.  Right Ventricle: Normal right ventricular size. Normal right ventricular systolic function. RV systolic pressure could not be determined  due to incomplete tricuspid regurgitation envelope.  Left Atrium: The left atrium is normal in size.  Right Atrium: The right atrium is of normal size.  Mitral Valve: No evidence of mitral regurgitation present.  Tricuspid Valve: The tricuspid valve is not well visualized. There is mild tricuspid regurgitation.  Aortic Valve: Trileaflet aortic valve. No Aortic valve stenosis.  Pulmonic Valve: The pulmonic valve is not well visualized. Trace pulmonic valve regurgitation present.  IVC/Hepatic Veins: The inferior vena cava was not visualized.  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  No Facial hair  No Beard        Dental           (+) poor dentition, missing, chipped           Pulmonary    Breath sounds clear to auscultation       Cardiovascular    Rhythm: regular  Rate: Abnormal       Other findings            Plan  ASA 3     Planned anesthesia type: general     general anesthesia with  endotracheal tube intubation    plan to administer opioids postoperatively            PONV/POV Plan:  I plan to administer pharmcologic prophalaxis antiemetics  Intravenous induction     Anesthesia issues/risks discussed are: Dental Injuries, Nerve Injuries, PONV, Post-op Cognitive Dysfunction, Post-op Agitation/Tantrum, Cardiac Events/MI, Stroke, Intraoperative Awareness/ Recall, Aspiration, Blood Loss and Sore Throat.  Anesthetic plan and risks discussed with patient  Signed consent obtained        Use of blood products discussed with patient who consented to blood products.     Patient's NPO status is appropriate for Anesthesia.           Plan discussed with CRNA.

## 2021-05-20 NOTE — Nurses Notes (Signed)
Contacted the Surg/Onc Service to inquire about continuation of heparin gtt and if patient is allowed to have diet advanced per family request. Awaiting call back.

## 2021-05-20 NOTE — Nurses Notes (Signed)
Received report from Dominica Severin in the PACU @ 1444.

## 2021-05-20 NOTE — Anesthesia Postprocedure Evaluation (Signed)
Anesthesia Post Op Evaluation    Patient: [name]  Procedure(s):  ENDOSCOPIC U/S WITH CYST GASTROSTOMY    Last Vitals:Temperature: 37.7 C (99.9 F) (05/20/21 1554)  Heart Rate: (!) 116 (05/20/21 1554)  BP (Non-Invasive): (!) 158/77 (RN notified) (05/20/21 1554)  Respiratory Rate: 18 (05/20/21 1554)  SpO2: 93 % (05/20/21 1554)    No notable events documented.      Patient location during evaluation: bedside       Patient participation: complete - patient participated  Level of consciousness: awake and alert    Pain management: satisfactory to patient  Airway patency: patent    Anesthetic complications: no  Cardiovascular status: hemodynamically stable  Respiratory status: acceptable  Hydration status: acceptable  Patient post-procedure temperature: Pt Normothermic   PONV Status: Absent

## 2021-05-20 NOTE — Anesthesia Transfer of Care (Signed)
ANESTHESIA TRANSFER OF CARE   [name] is a 76 y.o. ,male, Weight: 72.1 kg (158 lb 15.2 oz)   had Procedure(s):  ENDOSCOPIC U/S WITH CYST GASTROSTOMY  performed  05/20/21   Primary Service: Nicola Police*    Past Medical History:   Diagnosis Date   . Cancer (CMS [facility])     prostate   . Deep vein thrombosis (DVT) (CMS HCC)     right leg   . Esophageal reflux    . H/O hearing loss    . High cholesterol    . 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 HTN (hypertension)    . 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 Hyperlipidemia     "borderline"   . Hypothyroidism    . MRSA (methicillin resistant staph aureus) culture positive 01/02/2021    MRSA left groin abscess 01/03/21   . MRSA (methicillin resistant staph aureus) culture positive 01/02/2021    MRSA blood 01/02/21   . Pulmonary embolism (CMS HCC) 03/31/2021   . Thyroid disorder    . Wears glasses       Allergy History as of 05/20/21      No Known Allergies              I completed my transfer of care / handoff to the receiving personnel during which we discussed:  Access, All key/critical aspects of case discussed, Airway, Analgesia, Antibiotics, Expectation of post procedure, Fluids/Product, Gave opportunity for questions and acknowledgement of understanding and PMHx    Post Location: PACU                                                                  Last OR Temp: Temperature: 36.6 C (97.9 F)  ABG:  PH (ARTERIAL)   Date Value Ref Range Status   04/04/2021 7.41 7.35 - 7.45 Final     PH (T)   Date Value Ref Range Status   03/22/2021 7.36 7.35 - 7.45 Final     PCO2 (ARTERIAL)   Date Value Ref Range Status   04/04/2021 41 35 - 45 mm/Hg Final     PCO2 (VENOUS)   Date Value Ref Range Status   05/05/2021 42 41 - 51 mm/Hg Final     PO2 (ARTERIAL)   Date Value Ref Range Status   04/04/2021 77 72 - 100 mm/Hg Final     PO2 (VENOUS)   Date Value Ref Range Status   05/05/2021 58 (H) 35 - 50 mm/Hg Final      SODIUM   Date Value Ref Range Status   03/22/2021 135 (L) 137 - 145 mmol/L Final     POTASSIUM   Date Value Ref Range Status   05/20/2021 4.3 3.5 - 5.1 mmol/L Final     KETONES   Date Value Ref Range Status   05/10/2021 Negative Negative mg/dL Final     WHOLE BLOOD POTASSIUM   Date Value Ref Range Status   03/22/2021 3.6 3.5 - 4.6 mmol/L Final     CHLORIDE   Date Value Ref Range Status   03/22/2021 106 101 - 111 mmol/L Final     CALCIUM   Date  Value Ref Range Status   05/20/2021 8.9 8.8 - 10.2 mg/dL Final     Calculated P Axis   Date Value Ref Range Status   05/19/2021 47 degrees Final     Calculated R Axis   Date Value Ref Range Status   05/19/2021 30 degrees Final     Calculated T Axis   Date Value Ref Range Status   05/19/2021 52 degrees Final     IONIZED CALCIUM   Date Value Ref Range Status   04/19/2021 1.17 1.10 - 1.35 mmol/L Final     LACTATE   Date Value Ref Range Status   05/05/2021 1.8 (H) 0.0 - 1.3 mmol/L Final     HEMOGLOBIN   Date Value Ref Range Status   03/22/2021 11.1 (L) 12.0 - 18.0 g/dL Final     OXYHEMOGLOBIN   Date Value Ref Range Status   03/22/2021 97.8 85.0 - 98.0 % Final     CARBOXYHEMOGLOBIN   Date Value Ref Range Status   03/22/2021 1.8 0.0 - 2.5 % Final     MET-HEMOGLOBIN   Date Value Ref Range Status   03/22/2021 0.4 0.0 - 2.0 % Final     BASE EXCESS   Date Value Ref Range Status   05/05/2021 5.4 (H) -3.0 - 3.0 mmol/L Final     BASE EXCESS (ARTERIAL)   Date Value Ref Range Status   04/04/2021 1.2 (H) 0.0 - 1.0 mmol/L Final     BASE DEFICIT   Date Value Ref Range Status   04/03/2021 0.2 0.0 - 3.0 mmol/L Final     BICARBONATE (ARTERIAL)   Date Value Ref Range Status   04/04/2021 25.8 18.0 - 26.0 mmol/L Final     BICARBONATE (VENOUS)   Date Value Ref Range Status   05/05/2021 29.0 (H) 22.0 - 26.0 mmol/L Final     TEMPERATURE, COMP   Date Value Ref Range Status   03/22/2021 36.9 15.0 - 40.0 C Final     %FIO2 (VENOUS)   Date Value Ref Range Status   05/05/2021 36.0 % Final     Airway:* No  LDAs found *  Blood pressure (!) 147/56, pulse (!) 113, temperature 36.6 C (97.9 F), resp. rate 18, height 1.753 m (5' 9.02"), weight 72.1 kg (158 lb 15.2 oz), SpO2 95 %.

## 2021-05-20 NOTE — Nurses Notes (Signed)
[place] you still want to give the scheduled NPH and Humulin R tonight. Patient is NPO but has TPN on cyclic rate. thanks. #[phone]. Paged Aggie Hacker paged to notify.    2200: Insulin NPH 15units given and the 2000 scheduled insulin R held as per verbal order. Will continue to monitor

## 2021-05-21 DIAGNOSIS — K651 Peritoneal abscess: Secondary | ICD-10-CM

## 2021-05-21 DIAGNOSIS — E1065 Type 1 diabetes mellitus with hyperglycemia: Secondary | ICD-10-CM

## 2021-05-21 LAB — BASIC METABOLIC PANEL
ANION GAP: 7 mmol/L (ref 4–13)
BUN/CREA RATIO: 37 — ABNORMAL HIGH (ref 6–22)
BUN: 25 mg/dL (ref 8–25)
CALCIUM: 9.1 mg/dL (ref 8.8–10.2)
CHLORIDE: 106 mmol/L (ref 96–111)
CO2 TOTAL: 24 mmol/L (ref 23–31)
CREATININE: 0.68 mg/dL — ABNORMAL LOW (ref 0.75–1.35)
ESTIMATED GFR: >90 mL/min/BSA (ref >=60)
GLUCOSE: 169 mg/dL — ABNORMAL HIGH (ref 65–125)
POTASSIUM: 3.6 mmol/L (ref 3.5–5.1)
SODIUM: 137 mmol/L (ref 136–145)

## 2021-05-21 LAB — POC BLOOD GLUCOSE (RESULTS)
GLUCOSE, POC: 169 mg/dl — ABNORMAL HIGH (ref 70–105)
GLUCOSE, POC: 209 mg/dl — ABNORMAL HIGH (ref 70–105)
GLUCOSE, POC: 125 mg/dl — ABNORMAL HIGH (ref 70–105)
GLUCOSE, POC: 157 mg/dl — ABNORMAL HIGH (ref 70–105)
GLUCOSE, POC: 138 mg/dl — ABNORMAL HIGH (ref 70–105)

## 2021-05-21 LAB — CBC
HCT: 28.6 % — ABNORMAL LOW (ref 38.9–52.0)
HGB: 8.9 g/dL — ABNORMAL LOW (ref 13.4–17.5)
MCH: 27.5 pg (ref 26.0–32.0)
MCHC: 31.1 g/dL (ref 31.0–35.5)
MCV: 88.3 fL (ref 78.0–100.0)
MPV: 11.9 fL (ref 8.7–12.5)
PLATELETS: 323 10*3/uL (ref 150–400)
RBC: 3.24 10*6/uL — ABNORMAL LOW (ref 4.50–6.10)
RDW-CV: 17 % — ABNORMAL HIGH (ref 11.5–15.5)
WBC: 20.2 10*3/uL — ABNORMAL HIGH (ref 3.7–11.0)

## 2021-05-21 LAB — PHOSPHORUS: PHOSPHORUS: 2.6 mg/dL (ref 2.3–4.0)

## 2021-05-21 LAB — MAGNESIUM: MAGNESIUM: 2.2 mg/dL (ref 1.8–2.6)

## 2021-05-21 MED ORDER — LORAZEPAM 0.5 MG TABLET
0.2500 mg | ORAL_TABLET | Freq: Three times a day (TID) | ORAL | Status: AC | PRN
Start: 2021-05-21 — End: 2021-05-22

## 2021-05-21 MED ORDER — FLUCONAZOLE 200 MG TABLET
400.0000 mg | ORAL_TABLET | Freq: Every day | ORAL | Status: DC
Start: 2021-05-21 — End: 2021-05-28
  Administered 2021-05-21 – 2021-05-24 (×4): 400 mg via ORAL
  Administered 2021-05-25: 0 mg via ORAL
  Administered 2021-05-26 – 2021-05-27 (×2): 400 mg via ORAL
  Administered 2021-05-28: 0 mg via ORAL
  Filled 2021-05-21 (×8): qty 2

## 2021-05-21 MED ORDER — INSULIN NPH ISOPHANE U-100 HUMAN 100 UNIT/ML SUBCUTANEOUS SUSPENSION
20.0000 [IU] | Freq: Every evening | SUBCUTANEOUS | Status: DC
  Administered 2021-05-21: 20 [IU] via SUBCUTANEOUS

## 2021-05-21 MED ORDER — ENOXAPARIN 40 MG/0.4 ML SUBCUTANEOUS SYRINGE
40.0000 mg | INJECTION | Freq: Every day | SUBCUTANEOUS | Status: DC
Start: 2021-05-22 — End: 2021-05-22

## 2021-05-21 MED ORDER — WATER FOR INJECTION, STERILE INTRAVENOUS SOLUTION
INTRAVENOUS | Status: AC
Start: 2021-05-21 — End: 2021-05-22
  Filled 2021-05-21: qty 1000

## 2021-05-21 NOTE — Consults (Signed)
PATIENT NAME: [name] NUMBER:  [id number]  DATE OF SERVICE: 05/21/2021  DATE OF BIRTH:  1945-08-20    INFECTIOUS DISEASE CONSULT FOLLOWUP NOTE    Length of hospital stay: 48 days.    REASON FOR CONSULT:  Intra-abdominal abscess.     SUBJECTIVE:  Patient is resting in bed. He reports doing well after surgery. No other surgical plans He is frustrated he was kept NPO overnight. Tolerating diet after this procedure. No rashes. No fever or chills. Tolerating vancomycin without rash or pruritis.    OBJECTIVE:  Filed Vitals:    05/21/21 0345 05/21/21 0700 05/21/21 1050 05/21/21 1133   BP: (!) 152/76 (!) 153/86 126/67 (!) 143/87   Pulse: 96  97 97   Resp: 18   16   Temp: 36.5 C (97.7 F) 36.6 C (97.9 F) 36.5 C (97.7 F) 36.4 C (97.5 F)   SpO2: 91% 92% 94% 95%     Constitutional: Chronically ill elderly gentleman. Non-toxic. No acute distress.   Neurologic: Alert and oriented to person, place, and time.   Eyes: Sclera non-icteric, conjunctiva non-injected.   HENT: Buccal mucosa dry. No oral candidiasis.    Respiratory: Respirations are non labored on room air. Full breath sounds bilaterally without crackles or wheeze.  Cardiovascular: Heart has a regular rate and rhythm without murmur.   Gastrointestinal: Midline incision site with superior staples in place. Ostomy over the inferior portion with scant drainage. Does have fullness upon palpation of the LUQ but is not tender to palpation. Normoactive bowel sounds present.  Musculoskeletal: No swelling of the upper or lower extremities.   Integumentary: PICC line to the LUE. Skin is warm and non-diaphoretic.     ANTIBIOTICS:  1. Vancomycin, cefepime, and metronidazole 3/9 - 04/12/2021    2. Daptomycin and meropenem 04/12/2021 - 05/09/2021  3. Micafungin 04/25/2021 - 04/27/2021  4. Fluconazole 3/29 - 05/09/2021  5. Daptomycin and Meropenem 05/16/2021 - 05/20/2021  6. Vancomycin and meropenem 05/20/2021 -     LINES:  Patient Lines/Drains/Airways Status     Active  Line / Dialysis Catheter / Dialysis Graft / Drain / Airway / Wound     Name Placement date Placement time Site Days    PICC Triple Lumen Lumen 1 Red Lumen 2 White Lumen 3 Gray Left;Brachial Vein Central 04/29/21  1055  4 FR  22    Drain (Miscellaneous) peds ostomy bag Lower;Medial Abdomen 04/12/21  1300  -- 39    Surgical Incision Mid;Anterior Abdomen 04/03/21  1046  -- 48    Surgical Incision Other (Comment) Left;Upper Abdomen 04/03/21  1400  -- 47              LABS:  Results for orders placed or performed during the hospital encounter of 04/03/21 (from the past 24 hour(s))   CBC   Result Value Ref Range    WBC 20.2 (H) 3.7 - 11.0 x10^3/uL    RBC 3.24 (L) 4.50 - 6.10 x10^6/uL    HGB 8.9 (L) 13.4 - 17.5 g/dL    HCT 28.6 (L) 38.9 - 52.0 %    MCV 88.3 78.0 - 100.0 fL    MCH 27.5 26.0 - 32.0 pg    MCHC 31.1 31.0 - 35.5 g/dL    RDW-CV 17.0 (H) 11.5 - 15.5 %    PLATELETS 323 150 - 400 x10^3/uL    MPV 11.9 8.7 - 12.5 fL   BASIC METABOLIC PANEL   Result Value Ref Range  SODIUM 137 136 - 145 mmol/L    POTASSIUM 3.6 3.5 - 5.1 mmol/L    CHLORIDE 106 96 - 111 mmol/L    CO2 TOTAL 24 23 - 31 mmol/L    ANION GAP 7 4 - 13 mmol/L    CALCIUM 9.1 8.8 - 10.2 mg/dL    GLUCOSE 169 (H) 65 - 125 mg/dL    BUN 25 8 - 25 mg/dL    CREATININE 0.68 (L) 0.75 - 1.35 mg/dL    BUN/CREA RATIO 37 (H) 6 - 22    ESTIMATED GFR >90 >=60 mL/min/BSA    Narrative    Hemolysis can alter results at this level (slight).  Lipemia can alter results at this level (slight).   PHOSPHORUS   Result Value Ref Range    PHOSPHORUS 2.6 2.3 - 4.0 mg/dL    Narrative    Hemolysis can alter results at this level (slight).   MAGNESIUM   Result Value Ref Range    MAGNESIUM 2.2 1.8 - 2.6 mg/dL    Narrative    Hemolysis can alter results at this level (slight).  Lipemia can alter results at this level (slight).   POC BLOOD GLUCOSE (RESULTS)   Result Value Ref Range    GLUCOSE, POC 141 (H) 70 - 105 mg/dl   POC BLOOD GLUCOSE (RESULTS)   Result Value Ref Range    GLUCOSE, POC 228  (H) 70 - 105 mg/dl   POC BLOOD GLUCOSE (RESULTS)   Result Value Ref Range    GLUCOSE, POC 169 (H) 70 - 105 mg/dl   POC BLOOD GLUCOSE (RESULTS)   Result Value Ref Range    GLUCOSE, POC 209 (H) 70 - 105 mg/dl   POC BLOOD GLUCOSE (RESULTS)   Result Value Ref Range    GLUCOSE, POC 125 (H) 70 - 105 mg/dl     MICROBIOLOGY DATA:  1. Blood cultures x2 sets obtained 04/24/2021, are positive in 1 out of 2 sets for Candida albicans. Positive set obtained from the PICC line.   2. Blood cultures x 2 04/26/2021 are negative.   3. Blood cultures x 2 05/05/2021 are negative.   4.  Blood cultures x 05/09/2021 are negative.   5. Blood cultures x 2 05/14/2021 are positive in 1/2 sets for E. Faecium. Positive set from peripheral stick. PICC line culture negative.   6. Blood cultures 05/17/2021 is negative.     IMAGING:  Imaging has been reviewed.     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 He developed post operative complications with bleeding at the postsurgical site, status post ex lap with ligation of bleeding vessels on 04/03/2021, and subsequent embolization of splenic artery and left inferior adrenal artery on 04/09/2021.  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 IR placed a drain to RUQ on 04/09/2021 with cultures positive for Pseudomonas aeruginosa and Enterococcus faecium.  He was also identified to have a Pseudomonas bacteremia on 3/9. In total he was treated with a month of Daptomycin and meropenem completed 05/09/2021. 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 Off antibiotics the patient developed worsening  leukocytosis with repeat blood cultures 05/14/2021 now positive for Daptomycin resistant Enterococcus faecium.  Repeat imaging identified persistent intra-abdominal abscess. He underwent cystogastrostomy tube placement on 05/20/2021 with necrosectomy. Suspect polymicrobial infection. Given history of C. Albicans would additionally recommend addition of anti-fungal therapy.     1. Start fluconazole '400mg'$  PO/IV Q 24 hours.  2. Continue meropenem 1 gram IV q.8 hours.  3. Continue vancomycin dosed per pharmacy per protocol. Target AUC 400 - 600.   4. Recommend a 3 week course of the above regimen from date of necrosectomy, 05/20/2021. Anticipated end date of 06/10/2021.  5. Consult OPAT.    6. Continue Genvoya.  7. We will schedule the patient for a follow up appointment in the outpatient ID clinic 2 weeks after discharge.    8. Follow CBC + differential, BMP, LFT panel, and CRP for safety and monitoring while on antibiotics.       We will sign off today. Please call with any further questions.      I independently of the faculty provider spent a total of 30 minutes in direct/indirect care of this patient including initial evaluation, review of laboratory, radiology, diagnostic studies, review of medical record, order entry and coordination of care.      Monica Martinez, PA-C  05/21/2021, 14:55

## 2021-05-21 NOTE — Consults (Signed)
[facility]  Digestive Diseases Consult Follow Up      Blease, Capaldi, 76 y.o. male  Date of Service: 05/21/2021  Date of Birth:  [birth year]    Hospital Day:  LOS: 48 days     Assessment/Recommendations:  Cabe Lashley Montgomeryis a 76 y.o.malewith history ofHIV infection, hypertension, hyperlipidemia. 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 Imaging on 3/21 shows improvement in fluid collectionsbut persistent fluid around splenectomy bed with surgical drain coursing through it. [facility] is consulted forendoscopic evaluation.    Problem List:  1. Imaging concerning for PD leak  2. Fluid collection is splenectomy site  3. Peripancreatic fluid collections s/p RUQ drain and empiric embolization  4. IPMN s/pdistal pancreatectomy and splenectomy2/21/2023  5. Pseudomonas bacteremia  6. 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 HIV infection on treatment    EUS 05/06/21:  - 32 X 43 mm Walled off necrosis(WON)was seen in the BOP.   - Successful EUS guided cystogastrostomy with placement of LAMS.  - Necrosectomy performed with removal of large amount of necrotic material     EUS 05/20/21:  - Normal esophagus.   - Cystogastrostomy was performed.   - Necrosectomy was performed    Recommendations:  -EUS guided cyst gastrostomy with placement of lumen apposing metal stent and necrosectomy performed for wall of necrosis on 05/06/2021.    - Repeat  EUS with cystgastrostomy and necrosectomy performed 05/20/21  - advance diet as tolerated  - Recommend CT A/P with contrast in 4 weeks  - Will plan for repeat EGD with LAMS removal in 5 weeks following CT scan.  - thank you for consulting [facility].  Please page on-call GI fellow with questions.    Subjective: Notes he feels well this AM. Denies any new complaints.     Objective:  Temperature: 36.4 C (97.5 F)  Heart Rate: 97  BP (Non-Invasive): (!) 143/87 (RN notified)  Respiratory Rate: 16  SpO2: 95 %    General: In no acute distress  HEENT: Mucous membranes moist  Eyes: Conjunctiva non-icteric  Neck: Soft, supple  CV: Regular rate and rhythm no murmurs  Pulm: Clear to auscultation bilaterally no wheezing  Abd: Soft, Nontender nondistended with normal bowel sounds. Midline incision with Eakins pouch in place  Extrem: No cyanosis or edema  Skin: Warm and Dry  Neuro: AAO x 3      Labs:    Lab Results Today:    Results for orders placed or performed during the hospital encounter of 04/03/21 (from the past 24 hour(s))   POC BLOOD GLUCOSE (RESULTS)   Result Value Ref Range    GLUCOSE, POC 141 (H) 70 - 105 mg/dl   POC BLOOD GLUCOSE (RESULTS)   Result Value Ref Range    GLUCOSE, POC 228 (H) 70 - 105 mg/dl   POC BLOOD GLUCOSE (RESULTS)   Result Value Ref Range    GLUCOSE, POC 169 (H) 70 - 105 mg/dl   CBC   Result Value Ref Range    WBC 20.2 (H) 3.7 - 11.0 x10^3/uL  RBC 3.24 (L) 4.50 - 6.10 x10^6/uL    HGB 8.9 (L) 13.4 - 17.5 g/dL    HCT 28.6 (L) 38.9 - 52.0 %    MCV 88.3 78.0 - 100.0 fL    MCH 27.5 26.0 - 32.0 pg    MCHC 31.1 31.0 - 35.5 g/dL    RDW-CV 17.0 (H) 11.5 - 15.5 %    PLATELETS 323 150 - 400 x10^3/uL    MPV 11.9 8.7 - 12.5 fL   BASIC METABOLIC PANEL   Result Value Ref Range    SODIUM 137 136 - 145 mmol/L    POTASSIUM 3.6 3.5 - 5.1 mmol/L    CHLORIDE 106 96 - 111 mmol/L    CO2 TOTAL 24 23 - 31 mmol/L    ANION GAP 7 4 - 13 mmol/L    CALCIUM 9.1 8.8 - 10.2 mg/dL    GLUCOSE 169 (H) 65 - 125 mg/dL    BUN 25 8 -  25 mg/dL    CREATININE 0.68 (L) 0.75 - 1.35 mg/dL    BUN/CREA RATIO 37 (H) 6 - 22    ESTIMATED GFR >90 >=60 mL/min/BSA   PHOSPHORUS   Result Value Ref Range    PHOSPHORUS 2.6 2.3 - 4.0 mg/dL   MAGNESIUM   Result Value Ref Range    MAGNESIUM 2.2 1.8 - 2.6 mg/dL   POC BLOOD GLUCOSE (RESULTS)   Result Value Ref Range    GLUCOSE, POC 209 (H) 70 - 105 mg/dl   POC BLOOD GLUCOSE (RESULTS)   Result Value Ref Range    GLUCOSE, POC 125 (H) 70 - 105 mg/dl       Imaging Studies:  reviewed    Joyice Faster, M.D.  Gastroenterology & Hepatology Fellow  05/21/2021 - 14:55

## 2021-05-21 NOTE — Care Management Notes (Signed)
[facility] Management Note    Patient Name: [name]  Date of Birth: 1945/12/20  Sex: male  Date/Time of Admission: 04/03/2021  2:01 PM  Room/Bed: 961/A  Payor: [employer] / Plan: Jolayne Panther VAC[employer]/OPTUM / Product Type: Managed Care /    LOS: 48 days   Primary Care Providers:  [place] (General)    Admitting Diagnosis:  Intra abdominal hemorrhage [R58]    Assessment:      05/21/21 1329   Assessment Details   Assessment Type Continued Assessment   Date of Care Management Update 05/21/21   Date of Next DCP Update 05/24/21   Care Management Plan   Discharge Planning Status plan in progress   Projected Discharge Date 05/24/21   CM will evaluate for rehabilitation potential yes   Patient choice offered to patient/family no   Facility or Agency Preferences [facility]   Discharge Needs Assessment   Discharge Facility/Level of Care Needs Acute Rehab Placement/Return (not psych)(code 62)       Per service, pt still not medically ready for d/c.  Plan for likely admission to [facility] on d/c.  CM will follow for d/c needs.      Discharge Plan:  Acute Rehab Placement/Return (not psych) (code 53)      The patient will continue to be evaluated for developing discharge needs.     Case Manager: Joella Prince, [place]  Phone: [phone]

## 2021-05-21 NOTE — Care Plan (Signed)
05/21/21 1554   Therapist Pager   PT Assigned/ Pager # [name] [phone]   Rehab Session   Document Type therapy progress note (daily note)   Total PT Minutes: 2   Basic Mobility Am-PAC/6Clicks Score (APPROVED Staff)   Patient Mobility Barrier Patient declined treatment     PT/OT checked on pt in AM for tx. He was outside with staff at that time. PT checked back at this time. Pt is resting in bed.  Wife states that he walked around bed to w/c this am and walked to bathroom again later. He is currently nauseated and very fatigued. He had difficulty staying awake to talk to PT. PT will follow for tx next week.  Gillian Scarce, PT  Pager [phone]

## 2021-05-21 NOTE — Progress Notes (Signed)
[facility]  Surgical Oncology  Progress Note      [name], [name], 76 y.o. male  Date of Birth:  [birth year]  Date of Admission:  04/03/2021  Date of service: 05/21/2021    Assessment:  This is a 76 y.o. 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 He underwent massive transfusion protocol and ex lap on 04/03/21 at an outside facility with ligation of a bleeding vessel in the liver bed. Patient was subsequently transferred to [facility] postoperaively, intubated. Patient was subsequently extubated later that day on 3/4. 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 He failed advancing his diet to fulls the week of 4/08, backed off to TPN / clears after he developed severe nausea. Pt is now s/p repeat cystogastrostomy with GI on 4/20.    Plan/Recommendations:  -Diabetic cardiac diet  -Calorie count -ordered  - CT A/P with IV and oral contrast   Stable LUQ collection   - Pt underwent cystogastrostomy and necrosectomy yesterday with GI  - Positive blood culture    - One of two bottles positive for GPCs   - Repeat blood cultures 05/17/21 NGTD   - Continue antibiotics at this time, deescalate when able (currently on vancomycin and merrem)   - Appreciate ID recs  - Nausea:   Keep PRN Zofran & PRN Phenergan PO  - Anticipatory n/v and anxiety: ativan 0.'25mg'$  Q8 prn  - Heparin gtt held s/p cystogastrostomy  - Peripancreatic fluid collections   - Advanced GI performed EUS with cystogastrostomy 4/6 and repeat 4/21              - Blood cultures 3/9: + pseudomonas    - Cultures form R JP: Pseudomonas and enterococcus              - ID consulted; recommend vancomycin and meropenum  - PE   - s/p IVC filter placement  3/17  - Midline fistula with ostomy bag to monitor output  - Appreciate endocrine recs regarding glucose control  - OOB TID, encourage ambulation  - Pulmonary toilet, keep IS accessible at bedside  - PT/OT ordered, encourage pt to work with therapy  - Dispo: Floor status. Appreciate care management continued evaluation and assistance.      Subjective:   Pt seen this AM and states he had a difficult night. Reports having significant acid reflux overnight and unable to sleep. He was given compazine overnight for one episode of nausea per RN. The pt states he has had a very dry mouth. No acute concerns. He appears anxious this morning.    Objective  Filed Vitals:    05/20/21 1554 05/20/21 1918 05/20/21 2336 05/21/21 0345   BP: (!) 158/77 132/82 (!) 153/88 (!) 152/76   Pulse: (!) 116 (!) 120 (!) 103 96   Resp: '18 18 18 18   '$ Temp: 37.7 C (99.9 F) 37.6 C (99.7 F) 36.6 C (97.9 F) 36.5 C (97.7 F)   SpO2: 93% 90% 92% 91%     Physical Exam:   GEN:  AOx4, resting in bed, moderately anxious  PULM: Normal respiratory effort. Equal/symmetric chest rise.  CV:  Pink, well perfused, RR  ABD:   Abdomen soft, nontender, nondistended.  Lower midline incision with Eakins pouch with minimal thin output  MS: Atraumatic. Moves all extremities.  NEURO:   Alert and oriented to person, place and time. Cranial nerves grossly intact. Responds to all questions appropriately  Integumentary:  Pink, warm, and dry  PSYCHOSOCIAL: Anxious but cooperative. Normal affect.     Labs  Results for orders placed or performed during the hospital encounter of 04/03/21 (from the past 24 hour(s))   CBC   Result Value Ref Range    WBC 20.2 (H) 3.7 - 11.0 x10^3/uL    RBC 3.24 (L) 4.50 - 6.10 x10^6/uL    HGB 8.9 (L) 13.4 - 17.5 g/dL    HCT 28.6 (L) 38.9 - 52.0 %    MCV 88.3 78.0 - 100.0 fL    MCH 27.5 26.0 - 32.0 pg    MCHC 31.1 31.0 - 35.5 g/dL    RDW-CV 17.0 (H) 11.5 - 15.5 %    PLATELETS 323 150 - 400 x10^3/uL    MPV 11.9 8.7 - 12.5 fL   BASIC METABOLIC  PANEL   Result Value Ref Range    SODIUM 137 136 - 145 mmol/L    POTASSIUM 3.6 3.5 - 5.1 mmol/L    CHLORIDE 106 96 - 111 mmol/L    CO2 TOTAL 24 23 - 31 mmol/L    ANION GAP 7 4 - 13 mmol/L    CALCIUM 9.1 8.8 - 10.2 mg/dL    GLUCOSE 169 (H) 65 - 125 mg/dL    BUN 25 8 - 25 mg/dL    CREATININE 0.68 (L) 0.75 - 1.35 mg/dL    BUN/CREA RATIO 37 (H) 6 - 22    ESTIMATED GFR >90 >=60 mL/min/BSA    Narrative    Hemolysis can alter results at this level (slight).  Lipemia can alter results at this level (slight).   PHOSPHORUS   Result Value Ref Range    PHOSPHORUS 2.6 2.3 - 4.0 mg/dL    Narrative    Hemolysis can alter results at this level (slight).   MAGNESIUM   Result Value Ref Range    MAGNESIUM 2.2 1.8 - 2.6 mg/dL    Narrative    Hemolysis can alter results at this level (slight).  Lipemia can alter results at this level (slight).   POC BLOOD GLUCOSE (RESULTS)   Result Value Ref Range    GLUCOSE, POC 196 (H) 70 - 105 mg/dl   POC BLOOD GLUCOSE (RESULTS)   Result Value Ref Range    GLUCOSE, POC 141 (H) 70 - 105 mg/dl   POC BLOOD GLUCOSE (RESULTS)   Result Value Ref Range    GLUCOSE, POC 228 (H) 70 - 105 mg/dl   POC BLOOD GLUCOSE (RESULTS)   Result Value Ref Range    GLUCOSE, POC 169 (H) 70 - 105 mg/dl   POC BLOOD GLUCOSE (RESULTS)   Result Value Ref Range    GLUCOSE, POC 209 (H) 70 - 105 mg/dl       I/O:  Date 05/20/21 0700 - 05/21/21 0659 05/21/21 0700 - 05/22/21 0659   Shift 0700-1459 1500-2259 2300-0659 24 Hour Total 0700-1459 1500-2259 2300-0659 24 Hour Total   INTAKE   I.V.(mL/kg/hr) 0867(6.19) 70(0.12)  2345(1.36)         Volume Infused (adult custom variable rate parenteral nutrition) 1875   1875         Volume Infused (adult custom variable rate parenteral nutrition)  70  70         Volume  Infused (LR premix infusion) 400   400       IV Piggyback 100 257  357         Volume (meropenem (MERREM) 1 g in NS 100 mL IVPB minibag) 100   100         Volume (vancomycin (VANCOCIN) 750 mg in NS 250 mL IVPB)  257  257       Shift  Total(mL/kg) 4081(44.81) 327(4.54)  8563(14.97)       OUTPUT   Urine(mL/kg/hr) 150(0.26) 1300(2.25) 1250(2.17) 2700(1.56) 300   300     Urine (Voided) 150 1300 1250 2700 300   300     Urine Occurrence   1 x 1 x       Emesis             Emesis Occurrence  0 x 0 x 0 x       Other             Other  0 x 0 x 0 x       Shift Total(mL/kg) 150(2.08) 1300(18.03) 0263(78.58) 8502(77.41) 300(4.16)   300(4.16)   Weight (kg) 72.1 72.1 72.1 72.1 72.1 72.1 72.1 72.1       Radiology:  05/18/21 CT C/A/P   1.Postsurgical changes from distal pancreatectomy and splenectomy with complex fluid in the surgical beds similar in size to prior exam however with improving internal air.  2.Midline abdominal wall incision with underlying fluid and air grossly unchanged from prior exam.  3.Persistent portion of the transverse colon abutting the ventral abdominal wall without frank communication identified.  4.Trace left pleural effusion with bibasilar atelectasis.  5.Punctate calcifications layering within the bladder.    Current Medications:  Current Facility-Administered Medications   Medication Dose Route Frequency    acetaminophen (TYLENOL) tablet  500 mg Oral Q4H PRN    adult custom variable rate parenteral nutrition   Intravenous Continuous    atorvastatin (LIPITOR) tablet  40 mg Oral Daily with Breakfast    benzonatate (TESSALON) capsule  100 mg Oral Q8H PRN    D5W 250 mL flush bag   Intravenous Q15 Min PRN    D5W 250 mL flush bag   Intravenous Q15 Min PRN    diatrizoate meglumine & sodium oral solution  8 mL Oral Give in Radiology    elvitegravir-cobicistat-emtricitrabine-tenofovir (GENVOYA) 150-150-200-10 mg per tablet  1 Tablet Oral Daily    gabapentin (NEURONTIN) capsule  300 mg Oral 3x/day    [Held by provider] heparin 25,000 units in NS 250 mL infusion  18 Units/kg/hr (Adjusted) Intravenous Continuous    [Held by provider] insulin NPH human 100 units/mL injection  30 Units Subcutaneous Daily after Dinner    insulin NPH human 100  units/mL injection  15 Units Subcutaneous Daily before Breakfast    insulin R human (HUMULIN R) 100 units/mL injection - for tube feed coverage  8 Units Subcutaneous 3x/day    LORazepam (ATIVAN) tablet  0.25 mg Oral Q8H PRN    LR premix infusion   Intravenous Continuous    melatonin tablet  6 mg Oral NIGHTLY    meropenem (MERREM) 1 g in NS 100 mL IVPB minibag  1 g Intravenous Q8H    mirtazapine (REMERON) tablet  7.5 mg Oral NIGHTLY    multivitamin-minerals-folic acid-lycopene-lutein (CERTAVITE SENIOR) tablet  1 Tablet Oral Q24H    NS 250 mL flush bag   Intravenous Q15 Min PRN    NS 250 mL flush bag   Intravenous Q15  Min PRN    NS flush syringe  10-30 mL Intracatheter Q8HRS    NS flush syringe  20-30 mL Intracatheter Q1 MIN PRN    NS flush syringe  2-6 mL Intracatheter Q8HRS    NS flush syringe  2-6 mL Intracatheter Q1 MIN PRN    NS flush syringe  2-6 mL Intracatheter Q8HRS    NS flush syringe  2-6 mL Intracatheter Q1 MIN PRN    oxyCODONE (ROXICODONE) immediate release tablet  2.5 mg Oral Q4H PRN    oxyCODONE (ROXICODONE) immediate release tablet  5 mg Oral Q4H PRN    pantoprazole (PROTONIX) delayed release tablet  40 mg Oral Q12H    prochlorperazine (COMPAZINE) 5 mg/mL injection  10 mg Intravenous Q6H PRN    promethazine (PHENERGAN) tablet  6.25 mg Oral Q6H PRN    sennosides-docusate sodium (SENOKOT-S) 8.6-'50mg'$  per tablet  1 Tablet Oral 2x/day    SSIP insulin R human (HUMULIN R) 100 units/mL injection  0-12 Units Subcutaneous Q6H PRN    vancomycin (VANCOCIN) 750 mg in NS 250 mL IVPB  750 mg Intravenous Q12H    Vancomycin IV - Pharmacist to Dose per Protocol   Does not apply Daily PRN       Donna Christen, MD  General Surgery // PGY-1  [facility]    I saw and examined the patient.  I reviewed the resident's note.  I agree with the findings and plan of care as documented in the resident's note.  Any exceptions/additions are edited/noted.    Feeling a little rough after procedure yesterday.  Midline has minimal  drainage    Aaron Edelman A. Cyndi Bender, MD, FACS

## 2021-05-21 NOTE — Consults (Signed)
[name]  Endocrinology Consult  Follow Up Note    [name], 76 y.o. male  Date of Service: 05/21/2021  Date of Birth:  10-19-1945    [name] Day:  LOS: 48 days     Reason for Consult: : Diabetes management     [name] is a 76 y.o. 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      Subjective:   Patient seen at bedside , sleeping . Per wife at bedside he has been drinking sweet tea and also had apple sauce today and was able to keep it down without any nausea .  He has been working with PT as well , and more active than before .        Current Facility-Administered Medications   Medication Dose Route Frequency Provider Last Rate Last Admin   . acetaminophen (TYLENOL) tablet  500 mg Oral Q4H PRN Donna Christen, MD   500 mg at 05/20/21 2043   . adult custom variable rate parenteral nutrition   Intravenous Continuous Allene Pyo, PA-C 140 mL/hr at 05/21/21 0731 Solution Verified at 05/21/21 0731   . atorvastatin (LIPITOR) tablet  40 mg Oral Daily with Breakfast Nicola Police, MD   40 mg at 05/19/21 0854   . benzonatate (TESSALON) capsule  100 mg Oral Q8H PRN [name], MD       . D5W 250 mL flush bag   Intravenous Q15 Min PRN Modena Jansky, DO       . D5W 250 mL flush bag   Intravenous Q15 Min PRN Horazeck, Christian, MD       . diatrizoate meglumine & sodium oral solution  8 mL Oral Give in Radiology Orlie Dakin, MD       . elvitegravir-cobicistat-emtricitrabine-tenofovir (GENVOYA) 150-150-200-10 mg per tablet  1 Tablet Oral Daily Monia Pouch, DO   1 Tablet at 05/19/21 [phone]   . gabapentin (NEURONTIN) capsule  300 mg Oral 3x/day Mariea Clonts, MD   300 mg at 05/19/21 2204   . [Held by  provider] heparin 25,000 units in NS 250 mL infusion  18 Units/kg/hr (Adjusted) Intravenous Continuous Lucile Crater, MD   Stopped at 05/20/21 0350   . [Held by provider] insulin NPH human 100 units/mL injection  30 Units Subcutaneous Daily after [place] Meek, Dennis Bast, MD   30 Units at 05/19/21 2037   . insulin NPH human 100 units/mL injection  15 Units Subcutaneous Daily before Breakfast Malena Peer, MD   15 Units at 05/21/21 0938   . insulin R human (HUMULIN R) 100 units/mL injection - for tube feed coverage  8 Units Subcutaneous 3x/day Malena Peer, MD   8 Units at 05/21/21 0245   . LORazepam (ATIVAN) tablet  0.25 mg Oral Q8H PRN Mariea Clonts, MD   0.25 mg at 05/20/21 2117   . LR premix infusion   Intravenous Continuous Tamera Reason, MD 30 mL/hr at 05/20/21 1824 New Bag at 05/20/21 1824   . melatonin tablet  6 mg Oral NIGHTLY Donna Christen, MD   6 mg at 05/20/21 2043   . meropenem (MERREM) 1 g in NS 100 mL IVPB minibag  1 g Intravenous Q8H Nicola Police, MD 33 mL/hr at 05/21/21 0533 1  g at 05/21/21 0533   . mirtazapine (REMERON) tablet  7.5 mg Oral NIGHTLY Donna Christen, MD   7.5 mg at 05/20/21 2043   . multivitamin-minerals-folic acid-lycopene-lutein (CERTAVITE SENIOR) tablet  1 Tablet Oral Q24H Cosner, Adam, PA-C   1 Tablet at 05/19/21 1331   . NS 250 mL flush bag   Intravenous Q15 Min PRN Modena Jansky, DO 150 mL/hr at 05/20/21 0636 20 mL at 05/20/21 0636   . NS 250 mL flush bag   Intravenous Q15 Min PRN Horazeck, Christian, MD       . NS flush syringe  10-30 mL Intracatheter Q8HRS Nicola Police, MD   20 mL at 05/21/21 0600   . NS flush syringe  20-30 mL Intracatheter Q1 MIN PRN Nicola Police, MD       . NS flush syringe  2-6 mL Intracatheter Q8HRS Modena Jansky, DO   2 mL at 05/20/21 0600   . NS flush syringe  2-6 mL Intracatheter Q1 MIN PRN Modena Jansky, DO       . NS flush syringe  2-6 mL Intracatheter Q8HRS Horazeck, Christian, MD   2 mL at  05/21/21 0600   . NS flush syringe  2-6 mL Intracatheter Q1 MIN PRN Horazeck, Christian, MD       . oxyCODONE (ROXICODONE) immediate release tablet  2.5 mg Oral Q4H PRN Sowers, Briana, DO   2.5 mg at 05/10/21 1400   . oxyCODONE (ROXICODONE) immediate release tablet  5 mg Oral Q4H PRN Stacey Drain, DO   5 mg at 05/19/21 1451   . pantoprazole (PROTONIX) delayed release tablet  40 mg Oral Q12H [name], MD   40 mg at 05/21/21 0532   . prochlorperazine (COMPAZINE) 5 mg/mL injection  10 mg Intravenous Q6H PRN Mariea Clonts, MD   10 mg at 05/21/21 0249   . promethazine (PHENERGAN) tablet  6.25 mg Oral Q6H PRN Lucile Crater, MD   6.25 mg at 05/21/21 0641   . sennosides-docusate sodium (SENOKOT-S) 8.6-'50mg'$  per tablet  1 Tablet Oral 2x/day Cosner, Adam, PA-C   1 Tablet at 05/20/21 2043   . SSIP insulin R human (HUMULIN R) 100 units/mL injection  0-12 Units Subcutaneous Q6H PRN Malena Peer, MD   4 Units at 05/21/21 8127   . vancomycin (VANCOCIN) 750 mg in NS 250 mL IVPB  750 mg Intravenous Q12H Cosner, Adam, PA-C 129 mL/hr at 05/21/21 0619 750 mg at 05/21/21 0619   . Vancomycin IV - Pharmacist to Dose per Protocol   Does not apply Daily PRN Cosner, Adam, PA-C         Objective:  Temperature: 36.5 C (97.7 F)  Heart Rate: 96  BP (Non-Invasive): (!) 152/76 (RN notified)  Respiratory Rate: 18  SpO2: 91 %     General: Chronically ill appearing male in no acute distress.  HEENT: Head normocephalic, atraumatic. Conjunctiva clear. No proptosis or lid lag. Oral mucosa pink and moist.   Thyroid/Neck: No thyromegaly. Trachea midline.  Lungs: Normal respiratory effort.   Cardiac: Regular rate and rhythm.  Abdomen: Soft, non-tender, non-distended. Ostomy in place   Extremities: No edema, erythema, warmth, or tenderness.  Skin: Warm and dry. No visible rashes.  Neuro: sleepy ,no obvious focal neurologic deficit   Psychiatric: Normal mood and affect.      Lab Results   Component Value Date    WBC 20.2 (H) 05/21/2021     RBC 3.24 (L) 05/21/2021    HGB 8.9 (  L) 05/21/2021    HCT 28.6 (L) 05/21/2021    MCV 88.3 05/21/2021    MCH 27.5 05/21/2021    MCHC 31.1 05/21/2021     Lab Results   Component Value Date    SODIUM 137 05/21/2021    POTASSIUM 3.6 05/21/2021    CO2 24 05/21/2021    BUN 25 05/21/2021    CREATININE 0.68 (L) 05/21/2021    GFR >90 05/21/2021    CALCIUM 9.1 05/21/2021    ALBUMIN 2.0 (L) 05/10/2021    ALKPHOS 80 05/17/2021    ALT 27 05/17/2021    AST 15 05/17/2021    ANIONGAP 7 05/21/2021     Lab Results   Component Value Date    CHOLESTEROL 212 (H) 12/21/2020    HDLCHOL 44 12/21/2020    LDLCHOL 96 12/21/2020    LDLCHOLDIR 89 03/24/2020    TRIG 132 05/17/2021      Lab Results   Component Value Date    HA1C 9.3 (H) 03/17/2021         Recent Labs     05/19/21  0900 05/19/21  1220 05/19/21  1316 05/19/21  1807 05/19/21  2044 05/20/21  0105 05/20/21  0241 05/20/21  0502 05/20/21  0618 05/20/21  1106 05/20/21  2051 05/20/21  2332 05/21/21  0230 05/21/21  0627   GLUIP 230* 212* 217* 127* 177* 160* 172* 148* 189* 196* 141* 228* 169* 209*       Assessment/Recommendations:  [name] is a 76 y.o. 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 Endocrinology is consulted for management of diabetes.     Post Pancreatectomy Diabetes Mellitus-improved control   - Most recent HbA1c on 03/17/2021 was 9.3%.  - Home regimen: Lantus 11 units nightly, Novolog 4 units TID AC.   - Current inpatient regimen:   Insulin NPH 15 /15   units bid, Humulin R 8     units Q6 hours, scheduled with TPN  and Humulin R conservative  sliding scale 4x/day PRN.   -Diet: MNT PROTOCOL FOR DIETITIAN  DIETARY ORAL SUPPLEMENTS Oral Supplements with tray: Ensure High Protein-Vanilla; BREAKFAST/LUNCH/DINNER; 1 Each  adult  custom variable rate parenteral nutrition  DIET DIABETIC CARDIAC Calorie amount: CC 2000   Currently on TPN to run from 8 pm to next day 12 pm for 16 hrs.    Patient was off floor for his endoscopic procedure yesterday ,his night time NPH was given in half due to sugars in 140's ,and insulin R was held as well     - Recommendations:  Since he is T1DM, will not hold NPH .  eventually when he is off TPN will switch him to Lantus .  He received 15 units last night however had some early morning hyperglycemia will increase dose to 20 units , continue with 15 units in morning     Continue with the insulin R to 8 units with TPN , will keep schedule at 8 pm , 2 am and 8 am while on 16 hr TPN     Continue with insulin R every 6 hrs PRN per conservative scale .    Patient is eating better today , will expect some meal time highs ,will monitor it and see if meal time insulin  needs to be added       Will assess Insulin requirements and make changes. Please let us know if any changes made to TPN .      Thank you for this consult. Will continue to follow. Please page with questions.    Ector  Endocrine fellow    05/21/2021 07:34  Department of Endocrine, Diabetes and metabolism           05/21/2021  I saw and examined the patient.  I reviewed the fellow's note.  I agree with the findings and plan of care as documented in the fellow's note.  Any exceptions/additions are edited/noted.    Venancio Poisson, MD 05/21/2021, 21:43  Assistant Professor  Endocrinology & Metabolism  [facility]

## 2021-05-22 DIAGNOSIS — E1065 Type 1 diabetes mellitus with hyperglycemia: Secondary | ICD-10-CM

## 2021-05-22 LAB — CBC
HCT: 29.1 % — ABNORMAL LOW (ref 38.9–52.0)
HGB: 9 g/dL — ABNORMAL LOW (ref 13.4–17.5)
MCH: 26.9 pg (ref 26.0–32.0)
MCHC: 30.9 g/dL — ABNORMAL LOW (ref 31.0–35.5)
MCV: 87.1 fL (ref 78.0–100.0)
MPV: 12.6 fL — ABNORMAL HIGH (ref 8.7–12.5)
PLATELETS: 331 10*3/uL (ref 150–400)
RBC: 3.34 10*6/uL — ABNORMAL LOW (ref 4.50–6.10)
RDW-CV: 17 % — ABNORMAL HIGH (ref 11.5–15.5)
WBC: 16.2 10*3/uL — ABNORMAL HIGH (ref 3.7–11.0)

## 2021-05-22 LAB — BASIC METABOLIC PANEL
ANION GAP: 9 mmol/L (ref 4–13)
BUN/CREA RATIO: 34 — ABNORMAL HIGH (ref 6–22)
BUN: 23 mg/dL (ref 8–25)
CALCIUM: 9.2 mg/dL (ref 8.8–10.2)
CHLORIDE: 107 mmol/L (ref 96–111)
CO2 TOTAL: 24 mmol/L (ref 23–31)
CREATININE: 0.67 mg/dL — ABNORMAL LOW (ref 0.75–1.35)
ESTIMATED GFR: >90 mL/min/BSA (ref >=60)
GLUCOSE: 140 mg/dL — ABNORMAL HIGH (ref 65–125)
POTASSIUM: 3.7 mmol/L (ref 3.5–5.1)
SODIUM: 140 mmol/L (ref 136–145)

## 2021-05-22 LAB — VANCOMYCIN PEAK: VANCOMYCIN PEAK: 20.9 ug/mL — ABNORMAL LOW (ref 30.0–40.0)

## 2021-05-22 LAB — POC BLOOD GLUCOSE (RESULTS)
GLUCOSE, POC: 180 mg/dl — ABNORMAL HIGH (ref 70–105)
GLUCOSE, POC: 184 mg/dl — ABNORMAL HIGH (ref 70–105)
GLUCOSE, POC: 221 mg/dl — ABNORMAL HIGH (ref 70–105)
GLUCOSE, POC: 128 mg/dl — ABNORMAL HIGH (ref 70–105)
GLUCOSE, POC: 119 mg/dl — ABNORMAL HIGH (ref 70–105)

## 2021-05-22 LAB — VANCOMYCIN, TROUGH: VANCOMYCIN TROUGH: 14.4 ug/mL (ref 10.0–20.0)

## 2021-05-22 LAB — ADULT ROUTINE BLOOD CULTURE, SET OF 2 BOTTLES (BACTERIA AND YEAST)
BLOOD CULTURE, ROUTINE: NO GROWTH
BLOOD CULTURE, ROUTINE: NO GROWTH

## 2021-05-22 LAB — MAGNESIUM: MAGNESIUM: 2.2 mg/dL (ref 1.8–2.6)

## 2021-05-22 LAB — PHOSPHORUS: PHOSPHORUS: 3.4 mg/dL (ref 2.3–4.0)

## 2021-05-22 LAB — PTT (PARTIAL THROMBOPLASTIN TIME): APTT: 30.8 seconds (ref 24.2–37.5)

## 2021-05-22 MED ORDER — OXYCODONE 5 MG TABLET
5.0000 mg | ORAL_TABLET | ORAL | Status: AC
Start: 2021-05-22 — End: 2021-05-22
  Administered 2021-05-22: 5 mg via ORAL
  Filled 2021-05-22: qty 1

## 2021-05-22 MED ORDER — SCOPOLAMINE 1 MG OVER 3 DAYS TRANSDERMAL PATCH
1.0000 | MEDICATED_PATCH | TRANSDERMAL | Status: DC
Start: 2021-05-22 — End: 2021-05-28
  Administered 2021-05-22 – 2021-05-28 (×3): 1 via TRANSDERMAL
  Filled 2021-05-22 (×3): qty 1

## 2021-05-22 MED ORDER — SODIUM PHOSPHATE 3 MMOL/ML INTRAVENOUS SOLUTION
INTRAVENOUS | Status: AC
Start: 2021-05-22 — End: 2021-05-23
  Filled 2021-05-22: qty 1000

## 2021-05-22 MED ORDER — HEPARIN (PORCINE) 5,000 UNITS/ML BOLUS FOR DOSE ADJUSTMENT
50.0000 [IU]/kg | Freq: Once | INTRAMUSCULAR | Status: AC
Start: 2021-05-22 — End: 2021-05-22
  Administered 2021-05-22: 3500 [IU] via INTRAVENOUS
  Filled 2021-05-22: qty 1

## 2021-05-22 MED ORDER — INSULIN NPH ISOPHANE U-100 HUMAN 100 UNIT/ML SUBCUTANEOUS SUSPENSION
25.0000 [IU] | Freq: Every evening | SUBCUTANEOUS | Status: DC
  Administered 2021-05-22 – 2021-05-25 (×4): 25 [IU] via SUBCUTANEOUS

## 2021-05-22 NOTE — Progress Notes (Signed)
[facility]  Surgical Oncology  Progress Note      Mann, Skaggs, 76 y.o. male  Date of Birth:  11-16-1945  Date of Admission:  04/03/2021  Date of service: 05/22/2021    Assessment:  This is a 76 y.o. 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 He underwent massive transfusion protocol and ex lap on 04/03/21 at an outside facility with ligation of a bleeding vessel in the liver bed. Patient was subsequently transferred to [facility] postoperaively, intubated. Patient was subsequently extubated later that day on 3/4. 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 He failed advancing his diet to fulls the week of 4/08, backed off to TPN / clears after he developed severe nausea. Pt is now s/p repeat cystogastrostomy with GI on 4/20.     Plan/Recommendations:  -Diabetic cardiac diet as tolerated  -Continue calorie count  -Renew TPN today - cycle at night  -Pt underwent repeat cystogastrostomy and necrosectomy 4/20  -Plan to resume heparin gtt today, no bolus needed prior to gtt  - Positive blood culture    - One of two bottles positive for GPCs   - Repeat blood cultures 05/17/21 NGTD   - Continue antibiotics at this time, deescalate when able (currently on vancomycin and merrem)   - Appreciate ID recs  - Nausea control prn  - Anticipatory n/v and anxiety: ativan 0.'25mg'$  Q8 prn  - Peripancreatic fluid collections   - Advanced GI performed EUS with cystogastrostomy 4/6 and repeat 4/20              - Blood cultures 3/9: + pseudomonas    - Cultures form R JP: Pseudomonas and enterococcus              - ID consulted; recommend vancomycin and meropenum and diflucan until 5/11  - PE   - s/p IVC filter placement  3/17  - Midline fistula with ostomy bag to monitor output  - Appreciate endocrine recs regarding glucose control  - OOB TID, encourage ambulation  - Pulmonary toilet, keep IS accessible at bedside  - PT/OT ordered, encourage pt to work with therapies  - Dispo: Floor status. Appreciate care management continued evaluation and assistance.      Subjective:   Pt seen this AM and states he feels better than yesterday. Reports continued nausea and poor sleep at night time. States he does not have much of an appetite and "picks" at food. Drank 2 ensures yesterday. He denies CP, SOB, f/c. Having BMs.     Objective  Filed Vitals:    05/21/21 1457 05/21/21 2035 05/22/21 0300 05/22/21 0829   BP: (!) 146/92 (!) 160/88 (!) 168/96 (!) 162/92   Pulse: 93 (!) 109 97 (!) 101   Resp: '15 16 16 16   '$ Temp: 36.5 C (97.7 F) 37.2 C (99 F) 36.5 C (97.7 F) 36.8 C (98.2 F)   SpO2: 94% 94% 93% 93%     Physical Exam:   GEN:  AOx4, resting in bed, appears comfortable  PULM: Normal respiratory effort. Equal/symmetric chest rise.  CV:  Pink, well perfused, RR  ABD:   Abdomen soft, nontender, nondistended. Lower midline incision with Eakins pouch with minimal thin  output  MS: Atraumatic. Moves all extremities.  NEURO:   Alert and oriented to person, place and time. Cranial nerves grossly intact. Responds to all questions appropriately  Integumentary:  Pink, warm, and dry  PSYCHOSOCIAL: Anxious but cooperative. Normal affect.     Labs  Results for orders placed or performed during the hospital encounter of 04/03/21 (from the past 24 hour(s))   BASIC METABOLIC PANEL   Result Value Ref Range    SODIUM 140 136 - 145 mmol/L    POTASSIUM 3.7 3.5 - 5.1 mmol/L    CHLORIDE 107 96 - 111 mmol/L    CO2 TOTAL 24 23 - 31 mmol/L    ANION GAP 9 4 - 13 mmol/L    CALCIUM 9.2 8.8 - 10.2 mg/dL    GLUCOSE 140 (H) 65 - 125 mg/dL    BUN 23 8 - 25 mg/dL    CREATININE 0.67 (L) 0.75 - 1.35 mg/dL    BUN/CREA RATIO 34 (H) 6 - 22    ESTIMATED GFR >90 >=60 mL/min/BSA     Narrative    Hemolysis can alter results at this level (slight).  Lipemia can alter results at this level (slight).   PHOSPHORUS   Result Value Ref Range    PHOSPHORUS 3.4 2.3 - 4.0 mg/dL    Narrative    Hemolysis can alter results at this level (slight).   MAGNESIUM   Result Value Ref Range    MAGNESIUM 2.2 1.8 - 2.6 mg/dL    Narrative    Hemolysis can alter results at this level (slight).  Lipemia can alter results at this level (slight).   CBC   Result Value Ref Range    WBC 16.2 (H) 3.7 - 11.0 x10^3/uL    RBC 3.34 (L) 4.50 - 6.10 x10^6/uL    HGB 9.0 (L) 13.4 - 17.5 g/dL    HCT 29.1 (L) 38.9 - 52.0 %    MCV 87.1 78.0 - 100.0 fL    MCH 26.9 26.0 - 32.0 pg    MCHC 30.9 (L) 31.0 - 35.5 g/dL    RDW-CV 17.0 (H) 11.5 - 15.5 %    PLATELETS 331 150 - 400 x10^3/uL    MPV 12.6 (H) 8.7 - 12.5 fL   POC BLOOD GLUCOSE (RESULTS)   Result Value Ref Range    GLUCOSE, POC 125 (H) 70 - 105 mg/dl   POC BLOOD GLUCOSE (RESULTS)   Result Value Ref Range    GLUCOSE, POC 157 (H) 70 - 105 mg/dl   POC BLOOD GLUCOSE (RESULTS)   Result Value Ref Range    GLUCOSE, POC 138 (H) 70 - 105 mg/dl   POC BLOOD GLUCOSE (RESULTS)   Result Value Ref Range    GLUCOSE, POC 180 (H) 70 - 105 mg/dl   POC BLOOD GLUCOSE (RESULTS)   Result Value Ref Range    GLUCOSE, POC 184 (H) 70 - 105 mg/dl   POC BLOOD GLUCOSE (RESULTS)   Result Value Ref Range    GLUCOSE, POC 221 (H) 70 - 105 mg/dl       I/O:  Date 05/21/21 0700 - 05/22/21 0659 05/22/21 0700 - 05/23/21 0659   Shift 0700-1459 1500-2259 2300-0659 24 Hour Total 0700-1459 1500-2259 2300-0659 24 Hour Total   INTAKE   P.O. 350 713  1063         Oral 350 440  790         Supplement Volume  273  273  I.V.(mL/kg/hr) 1820(3.16)   1820(1.05)         Volume Infused (adult custom variable rate parenteral nutrition) 1820   1820       IV Piggyback 357 229 246.4 832.4 357   357     Volume (meropenem (MERREM) 1 g in NS 100 mL IVPB minibag) 100 100 246.4 446.4 100   100     Volume (vancomycin (VANCOCIN) 750 mg in NS 250  mL IVPB) 257 129  386 257   257   Shift Total(mL/kg) 7412(87.86) 767(20.94) 246.4(3.42) 3715.4(51.53) 357(4.95)   357(4.95)   OUTPUT   Urine(mL/kg/hr) 1200(2.08) 950(1.65) 1075(1.86) 3225(1.86) 700   700     Urine (Voided) 1200 950 1075 3225 700   700   Drains 0 0  0         Drain Output (Drain (Miscellaneous) peds ostomy bag Lower;Medial Abdomen) 0 0  0       Stool             Stool Occurrence 1 x 1 x  2 x       Shift Total(mL/kg) 7096(28.36) 950(13.18) 6294(76.54) 6503(54.65) 700(9.71)   700(9.71)   Weight (kg) 72.1 72.1 72.1 72.1 72.1 72.1 72.1 72.1       Radiology:  05/18/21 CT C/A/P   1.Postsurgical changes from distal pancreatectomy and splenectomy with complex fluid in the surgical beds similar in size to prior exam however with improving internal air.  2.Midline abdominal wall incision with underlying fluid and air grossly unchanged from prior exam.  3.Persistent portion of the transverse colon abutting the ventral abdominal wall without frank communication identified.  4.Trace left pleural effusion with bibasilar atelectasis.  5.Punctate calcifications layering within the bladder.    Current Medications:  Current Facility-Administered Medications   Medication Dose Route Frequency   . acetaminophen (TYLENOL) tablet  500 mg Oral Q4H PRN   . adult custom variable rate parenteral nutrition   Intravenous Continuous   . atorvastatin (LIPITOR) tablet  40 mg Oral Daily with Breakfast   . benzonatate (TESSALON) capsule  100 mg Oral Q8H PRN   . D5W 250 mL flush bag   Intravenous Q15 Min PRN   . diatrizoate meglumine & sodium oral solution  8 mL Oral Give in Radiology   . elvitegravir-cobicistat-emtricitrabine-tenofovir (GENVOYA) 150-150-200-10 mg per tablet  1 Tablet Oral Daily   . fluconazole (DIFLUCAN) tablet  400 mg Oral Daily   . gabapentin (NEURONTIN) capsule  300 mg Oral 3x/day   . [Held by provider] heparin 25,000 units in NS 250 mL infusion  18 Units/kg/hr (Adjusted) Intravenous Continuous   . insulin NPH  human 100 units/mL injection  15 Units Subcutaneous Daily before Breakfast   . insulin NPH human 100 units/mL injection  20 Units Subcutaneous Daily after Dinner   . insulin R human (HUMULIN R) 100 units/mL injection - for tube feed coverage  8 Units Subcutaneous 3x/day   . melatonin tablet  6 mg Oral NIGHTLY   . meropenem (MERREM) 1 g in NS 100 mL IVPB minibag  1 g Intravenous Q8H   . mirtazapine (REMERON) tablet  7.5 mg Oral NIGHTLY   . multivitamin-minerals-folic acid-lycopene-lutein (CERTAVITE SENIOR) tablet  1 Tablet Oral Q24H   . NS 250 mL flush bag   Intravenous Q15 Min PRN   . NS flush syringe  10-30 mL Intracatheter Q8HRS   . NS flush syringe  20-30 mL Intracatheter Q1 MIN PRN   . NS flush syringe  2-6 mL Intracatheter  Q8HRS   . NS flush syringe  2-6 mL Intracatheter Q1 MIN PRN   . pantoprazole (PROTONIX) delayed release tablet  40 mg Oral Q12H   . prochlorperazine (COMPAZINE) 5 mg/mL injection  10 mg Intravenous Q6H PRN   . promethazine (PHENERGAN) tablet  6.25 mg Oral Q6H PRN   . sennosides-docusate sodium (SENOKOT-S) 8.6-'50mg'$  per tablet  1 Tablet Oral 2x/day   . SSIP insulin R human (HUMULIN R) 100 units/mL injection  0-12 Units Subcutaneous Q6H PRN   . vancomycin (VANCOCIN) 750 mg in NS 250 mL IVPB  750 mg Intravenous Q12H   . Vancomycin IV - Pharmacist to Dose per Protocol   Does not apply Daily PRN       Donna Christen, MD  General Surgery // PGY-1  [place]

## 2021-05-22 NOTE — Pharmacy (Signed)
[facility] / [facility]  Therapeutic Drug Monitoring: Vancomycin  05/22/2021      Patient name: [name], [name]  Date of Birth:  [birth year]    Actual Weight:  Weight: 72.1 kg (158 lb 15.2 oz) (05/20/21 1036)     BMI:  BMI (Calculated): 23.51 (05/20/21 1036)      Date RPh Current regimen (including mg/kg) Indication &  Organism AUC or trough based dosing Target Levels^ SCr (mg/dL) CrCl* (mL/min) Infectious Laboratory Markers (as applicable)   Measured level(s)   (mcg/mL) Calculated AUC (if AUC based monitoring) Plan & predicted AUC/trough if initial dosing (including when levels are due) Comments   3/9 Sarah Initiating Intra-abdominal AUC 400-600 0.69 92.5 WBC: 35.5     - Initiate vancomycin 1g q12h (~13 mg/kg)   - predicted AUC 453  - levels in 48 hrs if continued  - MRSA swab ordered in case of concern for pneumonia    03/11 1154 kop Vancomycin 1gm q12h IAB/bacteremia AUC  0.8 ~92 34.0 Pk 20.9 (ext to 22.5)  Tr 12.0 (ext to 11.95) 402 -AUC in low end of acceptable range  -SCR has increased slightly so will continue same dose for now to avoid accummulation - cefepime increased to q8h per ID rec-growing PSA in blood  -monitor renal closely  +flagyl   3/13 Sarah          - Vancomycin discontinued, switched to daptomycin                   4/20 Sarah Re-initiating Intra-abdominal infection/bacteremia (E. Faecium)  AUC 400-600 0.8 79.8 WBC: 19.0   - Reinitiate vancomycin 750 mg q12h (~10 mg/kg)  - predicted AUC 421  - Levels after 4th/before 5th dose    4/22 CMT 750 mg q 12 h Intra-abdominal infection/bacteremia (E. Faecium) AUC 400-600 0.67 95.3 WBC: 16.2 Pk 20.9 (actual 24.1)  Tr 14.4 (actual 13.4) 441 Continue vancomycin 750 mg q 12h  Predicted AUC 441  Levels have been in range twice - can move to twice-weekly trough monitoring (pt > 65 years and on Q 12 H dosing)                       ^Target levels depends on dosing and monitoring method, AUC vs. trough based. For AUC based dosing  units are mg*h/L. For trough based dosing units are mcg/mL.     *Creatinine clearance is estimated by using the Cockcroft-Gault equation for adult patients and the Carol Ada for pediatric patients.    The decision to discontinue vancomycin therapy will be determined by the primary service.  Please contact the pharmacist with any questions regarding this patient's medication regimen.

## 2021-05-22 NOTE — Nurses Notes (Signed)
Patient's midline incision found to be leaking a moderate amount purulent drainage. Area cleaned and abd pad placed over the incision. Service paged.

## 2021-05-22 NOTE — Consults (Signed)
[facility]  Endocrinology Consult  Follow Up Note    [name], [name], 76 y.o. male  Date of Service: 05/22/2021  Date of Birth:  04-21-1945    Hospital Day:  LOS: 49 days     Reason for Consult: : Diabetes management     [name] is a 76 y.o. 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      Subjective:   Patient seen at bedside.  Reports of nausea and inability to sleep at night.  He feels disturbed by the constant noise outside his room.  Denies any episodes of vomiting.  Able to drink ensure.      Current Facility-Administered Medications   Medication Dose Route Frequency Provider Last Rate Last Admin   . acetaminophen (TYLENOL) tablet  500 mg Oral Q4H PRN Donna Christen, MD   500 mg at 05/22/21 1117   . adult custom variable rate parenteral nutrition   Intravenous Continuous Cosner, Adam, PA-C   Stopped at 05/22/21 0931   . adult custom variable rate parenteral nutrition   Intravenous Continuous Croley, Mallory, MD       . atorvastatin (LIPITOR) tablet  40 mg Oral Daily with Breakfast Nicola Police, MD   40 mg at 05/22/21 0844   . benzonatate (TESSALON) capsule  100 mg Oral Q8H PRN [name], MD       . D5W 250 mL flush bag   Intravenous Q15 Min PRN Modena Jansky, DO       . diatrizoate meglumine & sodium oral solution  8 mL Oral Give in Radiology Orlie Dakin, MD       . elvitegravir-cobicistat-emtricitrabine-tenofovir (GENVOYA) 150-150-200-10 mg per tablet  1 Tablet Oral Daily Monia Pouch, DO   1 Tablet at 05/22/21 [phone]   . fluconazole (DIFLUCAN) tablet  400 mg Oral Daily Donna Christen, MD   400 mg at 05/22/21 0844   . gabapentin (NEURONTIN) capsule  300 mg Oral 3x/day Mariea Clonts, MD   300  mg at 05/22/21 0844   . heparin 25,000 units in NS 250 mL infusion  18 Units/kg/hr (Adjusted) Intravenous Continuous Lucile Crater, MD 13.4 mL/hr at 05/22/21 1126 18 Units/kg/hr at 05/22/21 1126   . insulin NPH human 100 units/mL injection  15 Units Subcutaneous Daily before Breakfast Malena Peer, MD   15 Units at 05/22/21 [phone]   . insulin NPH human 100 units/mL injection  25 Units Subcutaneous Daily after Luna Glasgow, Inderpreet, MD       . insulin R human (HUMULIN R) 100 units/mL injection - for tube feed coverage  8 Units Subcutaneous 3x/day Malena Peer, MD   8 Units at 05/22/21 0903   . melatonin tablet  6 mg Oral NIGHTLY Donna Christen, MD   6 mg at 05/21/21 2124   . meropenem (MERREM) 1 g in NS 100 mL IVPB minibag  1 g Intravenous Q8H Croley, Mallory, MD 33 mL/hr at 05/22/21 1239 1 g at 05/22/21 1239   . mirtazapine (REMERON) tablet  7.5 mg Oral NIGHTLY Donna Christen, MD   7.5 mg at 05/21/21 2124   . multivitamin-minerals-folic acid-lycopene-lutein (CERTAVITE SENIOR) tablet  1 Tablet Oral Q24H Cosner, Adam, PA-C   1 Tablet at 05/21/21 1345   .  NS 250 mL flush bag   Intravenous Q15 Min PRN Modena Jansky, DO 150 mL/hr at 05/20/21 0636 20 mL at 05/20/21 0636   . NS flush syringe  10-30 mL Intracatheter Q8HRS Nicola Police, MD   10 mL at 05/22/21 0600   . NS flush syringe  20-30 mL Intracatheter Q1 MIN PRN Nicola Police, MD       . NS flush syringe  2-6 mL Intracatheter Q8HRS Modena Jansky, DO   2 mL at 05/22/21 0600   . NS flush syringe  2-6 mL Intracatheter Q1 MIN PRN Modena Jansky, DO       . pantoprazole (PROTONIX) delayed release tablet  40 mg Oral Rolla Flatten, MD   40 mg at 05/22/21 [phone]   . prochlorperazine (COMPAZINE) 5 mg/mL injection  10 mg Intravenous Q6H PRN Mariea Clonts, MD   10 mg at 05/22/21 0844   . promethazine (PHENERGAN) tablet  6.25 mg Oral Q6H PRN Lucile Crater, MD   6.25 mg at 05/22/21 1239   . scopolamine 1 mg over 3 days  transdermal patch  1 Patch Transdermal Q72H Croley, Mallory, MD       . sennosides-docusate sodium (SENOKOT-S) 8.6-'50mg'$  per tablet  1 Tablet Oral 2x/day Cosner, Adam, PA-C   1 Tablet at 05/21/21 0758   . SSIP insulin R human (HUMULIN R) 100 units/mL injection  0-12 Units Subcutaneous Q6H PRN Malena Peer, MD   2 Units at 05/21/21 1643   . vancomycin (VANCOCIN) 750 mg in NS 250 mL IVPB  750 mg Intravenous Q12H Donna Christen, MD   Stopped at 05/22/21 0840   . Vancomycin IV - Pharmacist to Dose per Protocol   Does not apply Daily PRN Cosner, Adam, PA-C         Objective:  Temperature: 36.8 C (98.2 F)  Heart Rate: (!) 105  BP (Non-Invasive): (!) 163/102  Respiratory Rate: 16  SpO2: 95 %     General: Chronically ill appearing male in no acute distress.  HEENT: Head normocephalic, atraumatic. Conjunctiva clear. No proptosis or lid lag. Oral mucosa pink and moist.   Thyroid/Neck: No thyromegaly. Trachea midline.  Lungs: Normal respiratory effort.   Cardiac: Regular rate and rhythm.  Abdomen: Soft, non-tender, non-distended. Ostomy in place   Extremities: No edema, erythema, warmth, or tenderness.  Skin: Warm and dry. No visible rashes.  Neuro: sleepy ,no obvious focal neurologic deficit   Psychiatric: Normal mood and affect.      Lab Results   Component Value Date    WBC 16.2 (H) 05/22/2021    RBC 3.34 (L) 05/22/2021    HGB 9.0 (L) 05/22/2021    HCT 29.1 (L) 05/22/2021    MCV 87.1 05/22/2021    MCH 26.9 05/22/2021    MCHC 30.9 (L) 05/22/2021     Lab Results   Component Value Date    SODIUM 140 05/22/2021    POTASSIUM 3.7 05/22/2021    CO2 24 05/22/2021    BUN 23 05/22/2021    CREATININE 0.67 (L) 05/22/2021    GFR >90 05/22/2021    CALCIUM 9.2 05/22/2021    ALBUMIN 2.0 (L) 05/10/2021    ALKPHOS 80 05/17/2021    ALT 27 05/17/2021    AST 15 05/17/2021    ANIONGAP 9 05/22/2021     Lab Results   Component Value Date    CHOLESTEROL 212 (H) 12/21/2020    HDLCHOL 44 12/21/2020    LDLCHOL 96 12/21/2020  LDLCHOLDIR 89  03/24/2020    TRIG 132 05/17/2021      Lab Results   Component Value Date    HA1C 9.3 (H) 03/17/2021         Recent Labs     05/20/21  2051 05/20/21  2332 05/21/21  0230 05/21/21  0627 05/21/21  1128 05/21/21  1627 05/21/21  2054 05/22/21  0209 05/22/21  0633 05/22/21  0856   GLUIP 141* 228* 169* 209* 125* 157* 138* 180* 184* 221*       Assessment/Recommendations:  [name] is a 76 y.o. 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 Endocrinology is consulted for management of diabetes.     Post Pancreatectomy Diabetes Mellitus-improved control   - Most recent HbA1c on 03/17/2021 was 9.3%.  - Home regimen: Lantus 11 units nightly, Novolog 4 units TID AC.   - Current inpatient regimen:   Insulin NPH 15 units q.a.m., 20 units q.p.m., Humulin R 8 units Q6 hours, scheduled with TPN  and Humulin R conservative  sliding scale 4x/day PRN.   -Diet: MNT PROTOCOL FOR DIETITIAN  DIETARY ORAL SUPPLEMENTS Oral Supplements with tray: Ensure High Protein-Vanilla; BREAKFAST/LUNCH/DINNER; 1 Each  DIET DIABETIC CARDIAC Calorie amount: CC 2000  ROOM SERVICE:  SEND AUTOMATIC HOUSE TRAY  adult custom variable rate parenteral nutrition  adult custom variable rate parenteral nutrition   Currently on TPN to run from 8 pm to next day 12 pm for 16 hrs.    - Recommendations:  Since he is T1DM, will not hold NPH .  eventually when he is off TPN will switch him to Lantus .  He received 20 units NPH last night however had some early morning hyperglycemia will increase dose to 25 units , continue with 15 units in morning     Continue with the insulin R to 8 units with TPN , will keep schedule at 8 pm , 2 am and 8 am while on 16 hr TPN   Continue with insulin R every 6 hrs PRN per conservative scale  .      Thank you for this consult. Will continue to follow. Please page with questions.    Jeanice Lim  05/22/2021  13:34  Fellow, Endocrinology, Diabetes and Metabolism  [facility]        05/22/2021  I saw and examined the patient.  I reviewed the fellow's note.  I agree with the findings and plan of care as documented in the fellow's note.  Any exceptions/additions are edited/noted.    Venancio Poisson, MD 05/22/2021, 15:54  Assistant Professor  Endocrinology & Metabolism  [facility]

## 2021-05-23 ENCOUNTER — Inpatient Hospital Stay (HOSPITAL_COMMUNITY): Payer: 59

## 2021-05-23 DIAGNOSIS — D72829 Elevated white blood cell count, unspecified: Secondary | ICD-10-CM

## 2021-05-23 LAB — BASIC METABOLIC PANEL
ANION GAP: 5 mmol/L (ref 4–13)
BUN/CREA RATIO: 34 — ABNORMAL HIGH (ref 6–22)
BUN: 24 mg/dL (ref 8–25)
CALCIUM: 9.2 mg/dL (ref 8.8–10.2)
CHLORIDE: 105 mmol/L (ref 96–111)
CO2 TOTAL: 28 mmol/L (ref 23–31)
CREATININE: 0.7 mg/dL — ABNORMAL LOW (ref 0.75–1.35)
ESTIMATED GFR: >90 mL/min/BSA (ref >=60)
GLUCOSE: 149 mg/dL — ABNORMAL HIGH (ref 65–125)
POTASSIUM: 3.9 mmol/L (ref 3.5–5.1)
SODIUM: 138 mmol/L (ref 136–145)

## 2021-05-23 LAB — PTT (PARTIAL THROMBOPLASTIN TIME): APTT: 98.3 seconds — ABNORMAL HIGH (ref 24.2–37.5)

## 2021-05-23 LAB — CBC
HCT: 30.2 % — ABNORMAL LOW (ref 38.9–52.0)
HGB: 9.2 g/dL — ABNORMAL LOW (ref 13.4–17.5)
MCH: 26.7 pg (ref 26.0–32.0)
MCHC: 30.5 g/dL — ABNORMAL LOW (ref 31.0–35.5)
MCV: 87.8 fL (ref 78.0–100.0)
MPV: 12.9 fL — ABNORMAL HIGH (ref 8.7–12.5)
PLATELETS: 359 10*3/uL (ref 150–400)
RBC: 3.44 10*6/uL — ABNORMAL LOW (ref 4.50–6.10)
RDW-CV: 17.2 % — ABNORMAL HIGH (ref 11.5–15.5)
WBC: 18.4 10*3/uL — ABNORMAL HIGH (ref 3.7–11.0)

## 2021-05-23 LAB — POC BLOOD GLUCOSE (RESULTS)
GLUCOSE, POC: 114 mg/dl — ABNORMAL HIGH (ref 70–105)
GLUCOSE, POC: 125 mg/dl — ABNORMAL HIGH (ref 70–105)
GLUCOSE, POC: 146 mg/dl — ABNORMAL HIGH (ref 70–105)
GLUCOSE, POC: 149 mg/dl — ABNORMAL HIGH (ref 70–105)
GLUCOSE, POC: 152 mg/dl — ABNORMAL HIGH (ref 70–105)
GLUCOSE, POC: 168 mg/dl — ABNORMAL HIGH (ref 70–105)
GLUCOSE, POC: 206 mg/dl — ABNORMAL HIGH (ref 70–105)

## 2021-05-23 LAB — MAGNESIUM: MAGNESIUM: 2.1 mg/dL (ref 1.8–2.6)

## 2021-05-23 LAB — PHOSPHORUS: PHOSPHORUS: 2.7 mg/dL (ref 2.3–4.0)

## 2021-05-23 MED ORDER — POTASSIUM CHLORIDE 2 MEQ/ML INTRAVENOUS SOLUTION
INTRAVENOUS | Status: AC
Start: 2021-05-23 — End: 2021-05-24
  Filled 2021-05-23: qty 1000

## 2021-05-23 MED ORDER — SODIUM HYPOCHLORITE 0.25 % SOLUTION
Freq: Two times a day (BID) | Status: DC
Start: 2021-05-23 — End: 2021-05-28
  Administered 2021-05-24 – 2021-05-25 (×2): 0 mL
  Filled 2021-05-23 (×2): qty 473

## 2021-05-23 MED ORDER — ALTEPLASE 1 MG/2 ML SYRINGE
1.0000 mg | INJECTION | Freq: Once | INTRAMUSCULAR | Status: AC
Start: 2021-05-23 — End: 2021-05-23
  Administered 2021-05-23: 1 mg
  Filled 2021-05-23: qty 2

## 2021-05-23 MED ORDER — LABETALOL 5 MG/ML INTRAVENOUS SOLUTION
10.0000 mg | INTRAVENOUS | Status: DC | PRN
Start: 2021-05-23 — End: 2021-05-28
  Administered 2021-05-23 – 2021-05-27 (×2): 10 mg via INTRAVENOUS
  Filled 2021-05-23 (×3): qty 20

## 2021-05-23 NOTE — Progress Notes (Signed)
[facility]  Surgical Oncology  Progress Note      [name], [name], 76 y.o. male  Date of Birth:  [birth year]  Date of Admission:  04/03/2021  Date of service: 05/23/2021    Assessment:  This is a 76 y.o. 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 He underwent massive transfusion protocol and ex lap on 04/03/21 at an outside facility with ligation of a bleeding vessel in the liver bed. Patient was subsequently transferred to [facility] postoperaively, intubated. Patient was subsequently extubated later that day on 3/4. 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 He failed advancing his diet to fulls the week of 4/08, backed off to TPN / clears after he developed severe nausea. Pt is now s/p repeat cystogastrostomy with GI on 4/20. Overnight team popped 2 midline abdominal staples and noticed purulent drainage from the site.     Plan/Recommendations:  -Diabetic cardiac diet as tolerated  -Continue calorie count  -Renew TPN today - cycle at night  -Pt underwent repeat cystogastrostomy and necrosectomy 4/20  -Midline wound (superior portion) with 2 popped staples overnight   -Nursing to perform BID packing changes with kerlix and dakins 0.25% - ordered  -Increase in leukocytosis - WBC 18.4 (from 16.2)   -AP/lateral CXR ordered   -Local wound care to upper midline wound  -Continue heparin gtt  - Positive blood culture    - One of two bottles positive for GPCs   - Repeat blood cultures 05/17/21 NGTD   - Continue antibiotics at this time, deescalate when able (currently on vancomycin and merrem)   - Appreciate ID recs  - Nausea control prn  - Peripancreatic fluid collections   - Advanced GI performed EUS  with cystogastrostomy 4/6 and repeat 4/20              - Blood cultures 3/9: + pseudomonas    - Cultures form R JP: Pseudomonas and enterococcus              - ID consulted; recommend vancomycin and meropenum and diflucan until 5/11  - PE   - s/p IVC filter placement 3/17  - Midline fistula with ostomy bag to monitor output  - Appreciate endocrine recs regarding glucose control  - OOB TID, encourage ambulation  - Pulmonary toilet, keep IS accessible at bedside  - PT/OT ordered, encourage pt to work with therapies  - Dispo: Floor status. Appreciate care management continued evaluation and assistance.      Subjective:   Pt seen this AM and continues to endorse nausea. He remains afebrile per chart review, with increased WBC to 18.4 today despite antibiotic coverage. Calorie count continued.     Objective  Filed Vitals:    05/22/21 1920 05/22/21 2336 05/23/21 0118 05/23/21 0320   BP: (!) 172/102 (!) 179/106 (!) 168/99 (!) 160/93   Pulse:  100  (!) 103   Resp:  18  18   Temp:  36.7 C (98.1 F)  37 C (98.6 F)   SpO2:  92%  91%     Physical Exam:   GEN:  AOx4, resting in bed, appears comfortable  PULM: Normal respiratory effort. Equal/symmetric chest rise.  CV:  Pink, well  perfused  ABD:   Abdomen soft, nontender, nondistended. Lower midline incision with Eakins pouch with minimal thin output, superior portion of midline wound with 2 popped stables, packed with kerlix (appears purulent)  MS: Atraumatic. Moves all extremities.  NEURO:   Alert and oriented to person, place and time. Cranial nerves grossly intact. Responds to all questions appropriately  Integumentary:  Pink, warm, and dry  PSYCHOSOCIAL: Anxious but cooperative. Normal affect.     Labs  Results for orders placed or performed during the hospital encounter of 04/03/21 (from the past 24 hour(s))   VANCOMYCIN PEAK   Result Value Ref Range    VANCOMYCIN PEAK 20.9 (L) 30.0 - 40.0 ug/mL   VANCOMYCIN, TROUGH   Result Value Ref Range    VANCOMYCIN TROUGH 14.4 10.0  - 20.0 ug/mL   PTT (PARTIAL THROMBOPLASTIN TIME) - ONCE   Result Value Ref Range    APTT 30.8 24.2 - 37.5 seconds    Narrative    Therapeutic range for unfractionated heparin is 60-100 seconds.   PTT (PARTIAL THROMBOPLASTIN TIME)   Result Value Ref Range    APTT 98.3 (H) 24.2 - 37.5 seconds    Narrative    Therapeutic range for unfractionated heparin is 60-100 seconds.   CBC   Result Value Ref Range    WBC 18.4 (H) 3.7 - 11.0 x10^3/uL    RBC 3.44 (L) 4.50 - 6.10 x10^6/uL    HGB 9.2 (L) 13.4 - 17.5 g/dL    HCT 30.2 (L) 38.9 - 52.0 %    MCV 87.8 78.0 - 100.0 fL    MCH 26.7 26.0 - 32.0 pg    MCHC 30.5 (L) 31.0 - 35.5 g/dL    RDW-CV 17.2 (H) 11.5 - 15.5 %    PLATELETS 359 150 - 400 x10^3/uL    MPV 12.9 (H) 8.7 - 12.5 fL   BASIC METABOLIC PANEL   Result Value Ref Range    SODIUM 138 136 - 145 mmol/L    POTASSIUM 3.9 3.5 - 5.1 mmol/L    CHLORIDE 105 96 - 111 mmol/L    CO2 TOTAL 28 23 - 31 mmol/L    ANION GAP 5 4 - 13 mmol/L    CALCIUM 9.2 8.8 - 10.2 mg/dL    GLUCOSE 149 (H) 65 - 125 mg/dL    BUN 24 8 - 25 mg/dL    CREATININE 0.70 (L) 0.75 - 1.35 mg/dL    BUN/CREA RATIO 34 (H) 6 - 22    ESTIMATED GFR >90 >=60 mL/min/BSA    Narrative    Hemolysis can alter results at this level (slight).  Lipemia can alter results at this level (slight).   PHOSPHORUS   Result Value Ref Range    PHOSPHORUS 2.7 2.3 - 4.0 mg/dL    Narrative    Hemolysis can alter results at this level (slight).   MAGNESIUM   Result Value Ref Range    MAGNESIUM 2.1 1.8 - 2.6 mg/dL   POC BLOOD GLUCOSE (RESULTS)   Result Value Ref Range    GLUCOSE, POC 128 (H) 70 - 105 mg/dl   POC BLOOD GLUCOSE (RESULTS)   Result Value Ref Range    GLUCOSE, POC 119 (H) 70 - 105 mg/dl   POC BLOOD GLUCOSE (RESULTS)   Result Value Ref Range    GLUCOSE, POC 149 (H) 70 - 105 mg/dl   POC BLOOD GLUCOSE (RESULTS)   Result Value Ref Range    GLUCOSE, POC 168 (H) 70 - 105  mg/dl   POC BLOOD GLUCOSE (RESULTS)   Result Value Ref Range    GLUCOSE, POC 146 (H) 70 - 105 mg/dl   POC BLOOD GLUCOSE  (RESULTS)   Result Value Ref Range    GLUCOSE, POC 206 (H) 70 - 105 mg/dl       I/O:  Date 05/22/21 0700 - 05/23/21 0659 05/23/21 0700 - 05/24/21 0659   Shift 0700-1459 1500-2259 2300-0659 24 Hour Total 0700-1459 1500-2259 2300-0659 24 Hour Total   INTAKE   P.O.  120  120         Oral  120  120       I.V.(mL/kg/hr) 8341(9.62)   1870(1.08)         Volume Infused (adult custom variable rate parenteral nutrition) 1870   1870       IV Piggyback 357 100  457 200   200     Volume (meropenem (MERREM) 1 g in NS 100 mL IVPB minibag) 100 100  200 100   100     Volume (vancomycin (VANCOCIN) 750 mg in NS 250 mL IVPB) 257   257 100   100   Shift Total(mL/kg) 2297(98.92) 220(3.05)  1194(17.40) 200(2.77)   200(2.77)   OUTPUT   Urine(mL/kg/hr) 814(4.81) 500(0.87) 1445(2.51) 8563(1.49)         Urine (Voided) [phone] 2645       Drains  0 0 0         Drain Output (Drain (Miscellaneous) peds ostomy bag Lower;Medial Abdomen)  0 0 0       Shift Total(mL/kg) 700(9.71) 500(6.93) 1445(20.04) 7026(37.85)       Weight (kg) 72.1 72.1 72.1 72.1 72.1 72.1 72.1 72.1       Radiology:  05/18/21 CT C/A/P   1.Postsurgical changes from distal pancreatectomy and splenectomy with complex fluid in the surgical beds similar in size to prior exam however with improving internal air.  2.Midline abdominal wall incision with underlying fluid and air grossly unchanged from prior exam.  3.Persistent portion of the transverse colon abutting the ventral abdominal wall without frank communication identified.  4.Trace left pleural effusion with bibasilar atelectasis.  5.Punctate calcifications layering within the bladder.    Current Medications:  Current Facility-Administered Medications   Medication Dose Route Frequency   . acetaminophen (TYLENOL) tablet  500 mg Oral Q4H PRN   . adult custom variable rate parenteral nutrition   Intravenous Continuous   . atorvastatin (LIPITOR) tablet  40 mg Oral Daily with Breakfast   . benzonatate (TESSALON) capsule  100 mg  Oral Q8H PRN   . D5W 250 mL flush bag   Intravenous Q15 Min PRN   . diatrizoate meglumine & sodium oral solution  8 mL Oral Give in Radiology   . elvitegravir-cobicistat-emtricitrabine-tenofovir (GENVOYA) 150-150-200-10 mg per tablet  1 Tablet Oral Daily   . fluconazole (DIFLUCAN) tablet  400 mg Oral Daily   . gabapentin (NEURONTIN) capsule  300 mg Oral 3x/day   . heparin 25,000 units in NS 250 mL infusion  18 Units/kg/hr (Adjusted) Intravenous Continuous   . insulin NPH human 100 units/mL injection  15 Units Subcutaneous Daily before Breakfast   . insulin NPH human 100 units/mL injection  25 Units Subcutaneous Daily after Dinner   . insulin R human (HUMULIN R) 100 units/mL injection - for tube feed coverage  8 Units Subcutaneous 3x/day   . labetalol (TRANDATE) 5 mg/mL injection  10 mg Intravenous Q2H PRN   . melatonin tablet  6  mg Oral NIGHTLY   . meropenem (MERREM) 1 g in NS 100 mL IVPB minibag  1 g Intravenous Q8H   . mirtazapine (REMERON) tablet  7.5 mg Oral NIGHTLY   . multivitamin-minerals-folic acid-lycopene-lutein (CERTAVITE SENIOR) tablet  1 Tablet Oral Q24H   . NS 250 mL flush bag   Intravenous Q15 Min PRN   . NS flush syringe  10-30 mL Intracatheter Q8HRS   . NS flush syringe  20-30 mL Intracatheter Q1 MIN PRN   . NS flush syringe  2-6 mL Intracatheter Q8HRS   . NS flush syringe  2-6 mL Intracatheter Q1 MIN PRN   . pantoprazole (PROTONIX) delayed release tablet  40 mg Oral Q12H   . prochlorperazine (COMPAZINE) 5 mg/mL injection  10 mg Intravenous Q6H PRN   . promethazine (PHENERGAN) tablet  6.25 mg Oral Q6H PRN   . scopolamine 1 mg over 3 days transdermal patch  1 Patch Transdermal Q72H   . sennosides-docusate sodium (SENOKOT-S) 8.6-'50mg'$  per tablet  1 Tablet Oral 2x/day   . sodium hypochlorite (DAKINS) 0.25% (half strength) irrigation   Irrigation Q12H   . SSIP insulin R human (HUMULIN R) 100 units/mL injection  0-12 Units Subcutaneous Q6H PRN   . vancomycin (VANCOCIN) 750 mg in NS 250 mL IVPB  750 mg  Intravenous Q12H   . Vancomycin IV - Pharmacist to Dose per Protocol   Does not apply Daily PRN       Donna Christen, MD  General Surgery // PGY-1  [place]

## 2021-05-23 NOTE — Nurses Notes (Addendum)
Paged surg oncology: pt [address] BP 179/106. [place]    Nadeen Landau, LPN  05/27/621, 76:28    pt 961 J. [address] BP 179/106. No PRN BP meds. ALso need altiplase for PICC. [place]  Nadeen Landau, LPN  04/15/1759, 60:73    Spoke to dr. She said she will put in orders. Nadeen Landau, LPN  08/10/6267, 48:54

## 2021-05-24 ENCOUNTER — Inpatient Hospital Stay (HOSPITAL_COMMUNITY): Payer: 59

## 2021-05-24 DIAGNOSIS — J90 Pleural effusion, not elsewhere classified: Secondary | ICD-10-CM

## 2021-05-24 DIAGNOSIS — J9811 Atelectasis: Secondary | ICD-10-CM

## 2021-05-24 DIAGNOSIS — G250 Essential tremor: Secondary | ICD-10-CM

## 2021-05-24 DIAGNOSIS — S31109A Unspecified open wound of abdominal wall, unspecified quadrant without penetration into peritoneal cavity, initial encounter: Secondary | ICD-10-CM

## 2021-05-24 DIAGNOSIS — N3289 Other specified disorders of bladder: Secondary | ICD-10-CM

## 2021-05-24 DIAGNOSIS — Z8546 Personal history of malignant neoplasm of prostate: Secondary | ICD-10-CM

## 2021-05-24 DIAGNOSIS — K828 Other specified diseases of gallbladder: Secondary | ICD-10-CM

## 2021-05-24 DIAGNOSIS — R590 Localized enlarged lymph nodes: Secondary | ICD-10-CM

## 2021-05-24 LAB — BASIC METABOLIC PANEL
ANION GAP: 8 mmol/L (ref 4–13)
BUN/CREA RATIO: 35 — ABNORMAL HIGH (ref 6–22)
BUN: 24 mg/dL (ref 8–25)
CALCIUM: 9.2 mg/dL (ref 8.8–10.2)
CHLORIDE: 105 mmol/L (ref 96–111)
CO2 TOTAL: 25 mmol/L (ref 23–31)
CREATININE: 0.68 mg/dL — ABNORMAL LOW (ref 0.75–1.35)
ESTIMATED GFR: >90 mL/min/BSA (ref >=60)
GLUCOSE: 106 mg/dL (ref 65–125)
POTASSIUM: 3.5 mmol/L (ref 3.5–5.1)
SODIUM: 138 mmol/L (ref 136–145)

## 2021-05-24 LAB — POC BLOOD GLUCOSE (RESULTS)
GLUCOSE, POC: 116 mg/dl — ABNORMAL HIGH (ref 70–105)
GLUCOSE, POC: 155 mg/dl — ABNORMAL HIGH (ref 70–105)
GLUCOSE, POC: 168 mg/dl — ABNORMAL HIGH (ref 70–105)
GLUCOSE, POC: 170 mg/dl — ABNORMAL HIGH (ref 70–105)
GLUCOSE, POC: 86 mg/dl (ref 70–105)

## 2021-05-24 LAB — PTT (PARTIAL THROMBOPLASTIN TIME)
APTT: 35.6 seconds (ref 24.2–37.5)
APTT: 90.3 seconds — ABNORMAL HIGH (ref 24.2–37.5)
APTT: 97.4 seconds — ABNORMAL HIGH (ref 24.2–37.5)

## 2021-05-24 LAB — PHOSPHORUS: PHOSPHORUS: 3.1 mg/dL (ref 2.3–4.0)

## 2021-05-24 LAB — CBC
HCT: 30.7 % — ABNORMAL LOW (ref 38.9–52.0)
HGB: 9.3 g/dL — ABNORMAL LOW (ref 13.4–17.5)
MCH: 26.3 pg (ref 26.0–32.0)
MCHC: 30.3 g/dL — ABNORMAL LOW (ref 31.0–35.5)
MCV: 86.7 fL (ref 78.0–100.0)
MPV: 13 fL — ABNORMAL HIGH (ref 8.7–12.5)
PLATELETS: 356 10*3/uL (ref 150–400)
RBC: 3.54 10*6/uL — ABNORMAL LOW (ref 4.50–6.10)
RDW-CV: 17.2 % — ABNORMAL HIGH (ref 11.5–15.5)
WBC: 18.2 10*3/uL — ABNORMAL HIGH (ref 3.7–11.0)

## 2021-05-24 LAB — MAGNESIUM: MAGNESIUM: 2 mg/dL (ref 1.8–2.6)

## 2021-05-24 MED ORDER — AMLODIPINE 5 MG TABLET
5.0000 mg | ORAL_TABLET | Freq: Every day | ORAL | Status: DC
Start: 2021-05-24 — End: 2021-05-26
  Administered 2021-05-24 – 2021-05-25 (×2): 5 mg via ORAL
  Filled 2021-05-24 (×2): qty 1

## 2021-05-24 MED ORDER — PARENTERAL AMINO ACID 10 % COMBINATION NO.6 INTRAVENOUS SOLUTION
INTRAVENOUS | Status: AC
Start: 2021-05-24 — End: 2021-05-25
  Filled 2021-05-24: qty 1000

## 2021-05-24 MED ORDER — HEPARIN (PORCINE) 5,000 UNITS/ML BOLUS FOR DOSE ADJUSTMENT
50.0000 [IU]/kg | Freq: Once | INTRAMUSCULAR | Status: AC
Start: 2021-05-24 — End: 2021-05-24
  Administered 2021-05-24: 3500 [IU] via INTRAVENOUS
  Filled 2021-05-24: qty 1

## 2021-05-24 MED ORDER — ALTEPLASE 1 MG/2 ML SYRINGE
1.0000 mg | INJECTION | Freq: Once | INTRAMUSCULAR | Status: AC
Start: 2021-05-24 — End: 2021-05-24
  Administered 2021-05-24: 1 mg
  Filled 2021-05-24: qty 2

## 2021-05-24 MED ORDER — ALTEPLASE 1 MG/2 ML SYRINGE
2.0000 mg | INJECTION | Freq: Once | INTRAMUSCULAR | Status: AC
Start: 2021-05-24 — End: 2021-05-24
  Administered 2021-05-24: 2 mg
  Filled 2021-05-24: qty 4

## 2021-05-24 MED ORDER — IOPAMIDOL 370 MG IODINE/ML (76 %) INTRAVENOUS SOLUTION
100.0000 mL | INTRAVENOUS | Status: AC
Start: 2021-05-24 — End: 2021-05-24
  Administered 2021-05-24: 100 mL via INTRAVENOUS

## 2021-05-24 MED ORDER — APIXABAN 2.5 MG TABLET
2.5000 mg | ORAL_TABLET | Freq: Two times a day (BID) | ORAL | Status: DC
Start: 2021-05-25 — End: 2021-05-25

## 2021-05-24 NOTE — Consults (Signed)
[facility]  Neurology Initial Consult    [name], [name], 76 y.o. 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   Neurology consulted for concerns of tremors.     Patient reports symptom onset one day prior to current admission (2 months ago) when he noticed his arms shaking, limited to BUE, he reports tremors both at rest and action based. He reports spilling of food while eating. Wife notes that he voice has become softer in the past 2 months, and worries that it could be depression given the complex surgical course currently. She also reports that she has noticed some head tremors when he is very tired prior to admission.     Patient is an avid golfer and has been golfing without any concerns. He does report surgery of C-spine 20 yrs ago for concerns of (??) disc herniation and has tolerated it without any concerns. Denies BLE tremors or spasticity. Denies anosmia or constipation hx.   Of note, patient reports Parkinson's disease in sister at age 6yr and she passed away at age 6580yr Otherwise no hx of neurodegenerative diseases in family.     Admission Source:  Home  Advance Directives:  None-Discussed  Hospice involvement prior to admission?  Not applicable    Location (of pain): Quality (character of pain) Severity (minimal, mild, severe, scale or 1-10) Duration (how long has pain/sx present) Timing (when does pain/sx occur)  Context (activity at/before onset) Modifying Factors (what makes pain/sx  Better/worse)  Associate Sign/Sx (what accompanies main pain/sx)    Past Medical History:   Diagnosis Date    Cancer (CMS HCC)     prostate    Deep vein thrombosis (DVT) (CMS HCC)     right leg    Esophageal reflux     H/O hearing loss     High cholesterol     History of kidney disease     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    HTN (hypertension)     Human immunodeficiency virus (HIV) disease (CMS HCC)     Hyperlipidemia     "borderline"    Hypothyroidism     MRSA (methicillin resistant staph aureus) culture positive 01/02/2021    MRSA left groin abscess 01/03/21    MRSA (methicillin resistant staph aureus) culture positive 01/02/2021    MRSA blood 01/02/21    [place]    Thyroid disorder     Wears glasses          Past Surgical History:   Procedure Laterality Date    COLON SURGERY      per patient had 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 HaDarryl NestleERVICAL SPINE SURGERY  2001    HX COLOSTOMY REVERSAL      HX HERNIA REPAIR  KNEE SURGERY Bilateral     WRIST SURGERY Right          Medications Prior to Admission       Prescriptions    acetaminophen (TYLENOL) 500 mg Oral Tablet    Take 2 Tablets (1,000 mg total) by mouth Every 4 hours as needed for Pain    amLODIPine (NORVASC) 5 mg Oral Tablet    Take 1 Tablet (5 mg total) by mouth Once a day    apixaban (ELIQUIS) 5 mg Oral Tablet    Take 2 Tablets (10 mg total) by mouth Twice daily for 14 doses    apixaban (ELIQUIS) 5 mg Oral Tablet    Take 1 Tablet (5 mg total) by mouth Twice daily for 180 days    atorvastatin (LIPITOR) 80 mg Oral Tablet    Take 0.5 Tablets (40 mg total) by mouth Every morning with breakfast    BIOTIN ORAL    Take 1 Tablet by mouth Once a day    Blood Sugar Diagnostic (ACCU-CHEK GUIDE TEST STRIPS) Strip    1 Strip Four times a day - before meals and bedtime    cetirizine  (ZYRTEC) 10 mg Oral Tablet    Take 1 Tablet (10 mg total) by mouth Once a day    cyanocobalamin (VITAMIN B 12) 1,000 mcg Oral Tablet    Take 1 Tablet (1,000 mcg total) by mouth Every morning    elviteg-cob-emtri-tenof ALAFEN (GENVOYA) 150-150-200-10 mg Oral Tablet    Take 1 Tablet by mouth Once a day    flash glucose scanning reader (FREESTYLE LIBRE 2 READER) Does not apply Misc    Use as directed with FreeStyle Libre 2 sensors    flash glucose sensor (FREESTYLE LIBRE 2 SENSOR) Does not apply Kit    To check blood glucose continuously. Change every 14 days    gabapentin (NEURONTIN) 300 mg Oral Capsule    Take 1 Capsule (300 mg total) by mouth Three times a day    insulin aspart U-100 (NOVOLOG) 100 unit/mL (3 mL) Subcutaneous Insulin Pen    Inject 4 Units under the skin Three times a day With meals    insulin glargine (LANTUS SOLOSTAR U-100 INSULIN) 100 unit/mL Subcutaneous Insulin Pen    Inject 15 units under the skin daily    insulin syr/ndl U100 half mark 0.3 mL 31 gauge x 5/16" Syringe    Use a new syringe for each injection.    lancets (FREESTYLE LANCETS) 28 gauge Misc    Use to test blood sugar 4 times a day - before meals and bedtime    levothyroxine (SYNTHROID) 88 mcg Oral Tablet    Take 1 Tablet (88 mcg total) by mouth Every morning    melatonin 5 mg Oral Tablet    Take 1 Tablet (5 mg total) by mouth Every night    MULTIVITAMIN ORAL    Take 1 Tablet by mouth Once a day    pantoprazole (PROTONIX) 40 mg Oral Tablet, Delayed Release (E.C.)    Take 1 Tablet (40 mg total) by mouth Twice daily 30 minutes before a meal    Pen Needle, Disposable, (BD NANO 2ND GEN PEN NEEDLE) 32 gauge x 5/32" Needle    Use a new pen needle for each injection.    POTASSIUM ORAL    Take 1 Tablet by mouth Once a day          No Known Allergies  Social History     Tobacco Use  Smoking status: Never    Smokeless tobacco: Never   Substance Use Topics    Alcohol use: Not Currently     Family Medical History:       Problem Relation (Age of  Onset)    Cancer Mother, Father, Other    Heart Attack Mother    High Cholesterol Other    Stroke Other              ROS: Positive for abdominal pain, back pain     Exam:  Temperature: 36.9 C (98.4 F)  Heart Rate: (!) 106 (Rn notified)  BP (Non-Invasive): (!) 138/94 (RN notified)  Respiratory Rate: 16  SpO2: 92 %  General: appears chronically ill  Gastrointestinal: Slightly tender due to post-op pain   Psychiatric: Slightly flat affect, otherwise normal   Musculoskeletal: Head atraumatic and normocephalic  Glasgow:  Total score only: 15/15  Mental status:  Level of Consciousness: alert  Orientations: Alert and oriented x 3  MemoryRegistration, Recall, and Following of commands is normal  AttentionsAttention and Concentration are normal  Knowledge: Good  Language: Normal  Speech: Normal, hypophonic   Cranial nerves:   CN2: VA normal to FC  CN 3,4,6: EOMI, PERRLA  CN 5Facial sensation intact  CN 7Face symmetrical  CN 8: Hearing grossly intact  CN 9,10: Palate symmetric and gag normal  CN 11: Sternocleidomastoid and Trapezius have normal strength.  CN 12: Tongue normal with no fasiculations or deviation  Gait, Coordination, and Reflexes:   Gait: UTA, post-op   Coordination: No ataxia on FNF  Movement: Increased tone in RUE and RLE, esp with C/L activation. No tremors at rest, but does have course, large amplitude proximal tremors noticed in RUE intermittently, slight head tremor, action tremor in LUE on FNF.  Muscle exam: Grossly normal, 4+/5 in BLE due to abdominal pain on activation   Reflexes   RJ BJ TJ KJ AJ Plantars Hoffman's   Right 2+ 3+ 2+ 2+ 1+ Downgoing Not present   Left 2+ 3+ 2+ 2+ 1+ Downgoing Not present     Sensory: Normal to light touch and pin prick     Labs:    Lab Results Today:    Results for orders placed or performed during the hospital encounter of 04/03/21 (from the past 24 hour(s))   POC BLOOD GLUCOSE (RESULTS)   Result Value Ref Range    GLUCOSE, POC 114 (H) 70 - 105 mg/dl   POC BLOOD  GLUCOSE (RESULTS)   Result Value Ref Range    GLUCOSE, POC 152 (H) 70 - 105 mg/dl   POC BLOOD GLUCOSE (RESULTS)   Result Value Ref Range    GLUCOSE, POC 168 (H) 70 - 105 mg/dl   CBC   Result Value Ref Range    WBC 18.2 (H) 3.7 - 11.0 x10^3/uL    RBC 3.54 (L) 4.50 - 6.10 x10^6/uL    HGB 9.3 (L) 13.4 - 17.5 g/dL    HCT 30.7 (L) 38.9 - 52.0 %    MCV 86.7 78.0 - 100.0 fL    MCH 26.3 26.0 - 32.0 pg    MCHC 30.3 (L) 31.0 - 35.5 g/dL    RDW-CV 17.2 (H) 11.5 - 15.5 %    PLATELETS 356 150 - 400 x10^3/uL    MPV 13.0 (H) 8.7 - 12.5 fL   BASIC METABOLIC PANEL   Result Value Ref Range    SODIUM 138 136 - 145 mmol/L    POTASSIUM 3.5 3.5 - 5.1 mmol/L  CHLORIDE 105 96 - 111 mmol/L    CO2 TOTAL 25 23 - 31 mmol/L    ANION GAP 8 4 - 13 mmol/L    CALCIUM 9.2 8.8 - 10.2 mg/dL    GLUCOSE 106 65 - 125 mg/dL    BUN 24 8 - 25 mg/dL    CREATININE 0.68 (L) 0.75 - 1.35 mg/dL    BUN/CREA RATIO 35 (H) 6 - 22    ESTIMATED GFR >90 >=60 mL/min/BSA   PHOSPHORUS   Result Value Ref Range    PHOSPHORUS 3.1 2.3 - 4.0 mg/dL   MAGNESIUM   Result Value Ref Range    MAGNESIUM 2.0 1.8 - 2.6 mg/dL   PTT (PARTIAL THROMBOPLASTIN TIME) - ONCE   Result Value Ref Range    APTT 35.6 24.2 - 37.5 seconds   POC BLOOD GLUCOSE (RESULTS)   Result Value Ref Range    GLUCOSE, POC 170 (H) 70 - 105 mg/dl   PTT (PARTIAL THROMBOPLASTIN TIME) - ONCE   Result Value Ref Range    APTT 90.3 (H) 24.2 - 37.5 seconds   POC BLOOD GLUCOSE (RESULTS)   Result Value Ref Range    GLUCOSE, POC 86 70 - 105 mg/dl       Review of reports and notes reveal:   MRI cervical spine wo on 11/29/2018  1. Reversal of cervical lordosis is present with widespread facet arthrosis (marked, at the right C7-T1 facet).  2. Midline disc protrusions are evident at C3-4 and C4-5 and there are broad disc osteophyte complexes at C5-6 and C6-7 resulting in either mild or moderate central canal stenosis. There is additional neural foraminal stenosis on the right at C3-4 and on the left at C5-6 and  C6-7.    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   Neurology consulted for concerns of tremors. Neurological exam shows mild spasticity in the RUE and RLE, and BUE hyperreflexia. Tremors have a mixed component of dystonic (proximal, high-amplitude) and essential tremors.     New onset tremors   Etiology could be multifactorial - deconditioning vs worsening spinal stenosis vs less concerning for Parkinson's disease   - Recommend MRI brain and C-spine w/wo to assess for structural deficits  - No acute pharmacological management of tremors     - Recommend physical therapy   - Outpatient follow up in Movement disorder clinic ([facility])  - Further recommendations after staffing in AM    Thank you for this consult and allowing [place] to participate in the care of this patient. Please page with any further questions or concerns.        DNR Status this admission:  Full Code  Palliative/Supportive Care consulted?  no  Hospice Consulted?  Not applicable    Current Comorbid Conditions - Neurology Consult  Compression of the Brain:  no  Coma (GCS less than 8):Coma -Not applicable  TIA not applicable  Encephalopathy:  no  Encephalitis-Not applicable  Seizure-Not applicable   Respiratory Failure/Other-Not applicable  No  Coagulopathy Not applicable  N/A    DVT/PE Prophylaxis:  Per primary     Supriya Ramesha, MD    [facility] 05/24/2021  POST-STAFFING    Bilateral upper extremities tremor  -not present currently  -will monitor symptoms  -Advise patient's wife to take video  -Outpatient neurology follow up (order placed)  -MRI brain showed chronic small left posterial parietal infarct  -MRI cervical spine showed moderate-severe spinal canal narrowing without spinal cord compression      -recommend outpatient follow up with spine     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  I reviewed the resident's note.  I agree with the findings and plan of care as documented in the resident's note.  Any exceptions/additions are edited/noted.    Gemma Payor, MD

## 2021-05-24 NOTE — Progress Notes (Signed)
[facility]  Surgical Oncology  Progress Note      [name], [name], 76 y.o. male  Date of Birth:  08-20-1945  Date of Admission:  04/03/2021  Date of service: 05/24/2021    Assessment:  This is a 76 y.o. 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 He underwent massive transfusion protocol and ex lap on 04/03/21 at an outside facility with ligation of a bleeding vessel in the liver bed. Patient was subsequently transferred to [facility] postoperaively, intubated. Patient was subsequently extubated later that day on 3/4. 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 He failed advancing his diet to fulls the week of 4/08, backed off to TPN / clears after he developed severe nausea. Pt is now s/p repeat cystogastrostomy with GI on 4/20.     Plan/Recommendations:  -Diabetic cardiac diet as tolerated  -Continue calorie count  -Renew TPN today - cycle at night  -Persistent leukocytosis despite antibiotic therapies   -CT abd/pelvis with IV contrast today - ordered  -Pt underwent repeat cystogastrostomy and necrosectomy 4/20  -Midline wound (superior portion)   -Nursing to perform BID packing changes with kerlix and dakins 0.25% - ordered  -Continue heparin gtt  - Positive blood culture    - One of two bottles positive for GPCs   - Repeat blood cultures 05/17/21 NGTD   - Continue antibiotics at this time, deescalate when able (currently on vancomycin and merrem)   - Appreciate ID recs  - Nausea control prn  - Peripancreatic fluid collections   - Advanced GI performed EUS with cystogastrostomy 4/6 and repeat 4/20              - Blood cultures 3/9: + pseudomonas    - Cultures form R JP: Pseudomonas and  enterococcus              - ID consulted; recommend vancomycin and meropenum and diflucan until 5/11  - PE   - s/p IVC filter placement 3/17  - Fine tremors: will plan to consult neurology for recommendations today  - Midline fistula with ostomy bag to monitor output  - Appreciate endocrine recs regarding glucose control  - OOB TID, encourage ambulation  - Pulmonary toilet, keep IS accessible at bedside  - PT/OT ordered, encourage pt to work with therapies  - Dispo: Floor status. Appreciate care management continued evaluation and assistance.      Subjective:   Pt seen this AM in NAD. He reports that he is concerns about the tremors in his hands/arms. He reports no acute concerns this morning. Resting comfortably.    Objective  Filed Vitals:    05/24/21 0159 05/24/21 0520 05/24/21 0808 05/24/21 1123   BP: (!) 160/82 (!) 164/80 (!) 157/89 (!) 138/94   Pulse:  100 (!) 102 (!) 106   Resp:  '16 16 16   '$ Temp:  37.3 C (99.1 F) 36.9 C (98.4 F) 36.9 C (98.4 F)   SpO2:  92% 90% 92%     Physical Exam:   GEN:  AOx4, resting in bed, appears comfortable  PULM: Normal respiratory effort. Equal/symmetric chest rise.  CV:  Pink, well perfused  ABD:   Abdomen soft, nontender, nondistended. Lower midline  incision with Eakins pouch with minimal thin output  MS: Atraumatic. Moves all extremities.  NEURO:   Alert and oriented to person, place and time. Cranial nerves grossly intact. Responds to all questions appropriately. Fine tremors of hands/arms noted b/l UE  Integumentary:  Pink, warm, and dry  PSYCHOSOCIAL: Anxious but cooperative. Normal affect.     Labs  Results for orders placed or performed during the hospital encounter of 04/03/21 (from the past 24 hour(s))   CBC   Result Value Ref Range    WBC 18.2 (H) 3.7 - 11.0 x10^3/uL    RBC 3.54 (L) 4.50 - 6.10 x10^6/uL    HGB 9.3 (L) 13.4 - 17.5 g/dL    HCT 30.7 (L) 38.9 - 52.0 %    MCV 86.7 78.0 - 100.0 fL    MCH 26.3 26.0 - 32.0 pg    MCHC 30.3 (L) 31.0 - 35.5 g/dL    RDW-CV  17.2 (H) 11.5 - 15.5 %    PLATELETS 356 150 - 400 x10^3/uL    MPV 13.0 (H) 8.7 - 12.5 fL   BASIC METABOLIC PANEL   Result Value Ref Range    SODIUM 138 136 - 145 mmol/L    POTASSIUM 3.5 3.5 - 5.1 mmol/L    CHLORIDE 105 96 - 111 mmol/L    CO2 TOTAL 25 23 - 31 mmol/L    ANION GAP 8 4 - 13 mmol/L    CALCIUM 9.2 8.8 - 10.2 mg/dL    GLUCOSE 106 65 - 125 mg/dL    BUN 24 8 - 25 mg/dL    CREATININE 0.68 (L) 0.75 - 1.35 mg/dL    BUN/CREA RATIO 35 (H) 6 - 22    ESTIMATED GFR >90 >=60 mL/min/BSA   PHOSPHORUS   Result Value Ref Range    PHOSPHORUS 3.1 2.3 - 4.0 mg/dL   MAGNESIUM   Result Value Ref Range    MAGNESIUM 2.0 1.8 - 2.6 mg/dL   PTT (PARTIAL THROMBOPLASTIN TIME) - ONCE   Result Value Ref Range    APTT 35.6 24.2 - 37.5 seconds    Narrative    Therapeutic range for unfractionated heparin is 60-100 seconds.   PTT (PARTIAL THROMBOPLASTIN TIME) - ONCE   Result Value Ref Range    APTT 90.3 (H) 24.2 - 37.5 seconds    Narrative    Therapeutic range for unfractionated heparin is 60-100 seconds.   POC BLOOD GLUCOSE (RESULTS)   Result Value Ref Range    GLUCOSE, POC 114 (H) 70 - 105 mg/dl   POC BLOOD GLUCOSE (RESULTS)   Result Value Ref Range    GLUCOSE, POC 152 (H) 70 - 105 mg/dl   POC BLOOD GLUCOSE (RESULTS)   Result Value Ref Range    GLUCOSE, POC 168 (H) 70 - 105 mg/dl   POC BLOOD GLUCOSE (RESULTS)   Result Value Ref Range    GLUCOSE, POC 170 (H) 70 - 105 mg/dl   POC BLOOD GLUCOSE (RESULTS)   Result Value Ref Range    GLUCOSE, POC 86 70 - 105 mg/dl       I/O:  Date 05/23/21 0700 - 05/24/21 0659 05/24/21 0700 - 05/25/21 0659   Shift 0700-1459 1500-2259 2300-0659 24 Hour Total 0700-1459 1500-2259 2300-0659 24 Hour Total   INTAKE   P.O.  273  273 320   320     Oral     320   320     Supplement Volume  273  273  I.V.(mL/kg/hr) 1820(3.16)   1820(1.05) 1842.17   1842.17     Volume Infused (adult custom variable rate parenteral nutrition) 1820   1820         Volume Infused (adult custom variable rate parenteral nutrition)      1842.17   1842.17   IV Piggyback 200 357.5 100 657.5 357.5   357.5     Volume (meropenem (MERREM) 1 g in NS 100 mL IVPB minibag) 100 100 100 300 100   100     Volume (vancomycin (VANCOCIN) 750 mg in NS 250 mL IVPB) 100 257.5  357.5 257.5   257.5   Shift Total(mL/kg) 4332(95.18) 630.5(8.74) 100(1.39) 2750.5(38.15) 8416.60(63.01)   6010.93(23.55)   OUTPUT   Urine(mL/kg/hr) 400(0.69) 950(1.65) 950(1.65) 2300(1.33) 1050   1050     Urine     300   300     Urine (Voided) [phone] 750   750     Urine Occurrence     1 x   1 x   Drains 0 0  0 0   0     Drain Output (Drain (Miscellaneous) peds ostomy bag Lower;Medial Abdomen) 0 0  0 0   0   Shift Total(mL/kg) 400(5.55) 732(20.25) 427(06.23) 2300(31.9) 7628(31.51)   7616(07.37)   Weight (kg) 72.1 72.1 72.1 72.1 72.1 72.1 72.1 72.1       Radiology:  05/18/21 CT C/A/P   1.Postsurgical changes from distal pancreatectomy and splenectomy with complex fluid in the surgical beds similar in size to prior exam however with improving internal air.  2.Midline abdominal wall incision with underlying fluid and air grossly unchanged from prior exam.  3.Persistent portion of the transverse colon abutting the ventral abdominal wall without frank communication identified.  4.Trace left pleural effusion with bibasilar atelectasis.  5.Punctate calcifications layering within the bladder.    Current Medications:  Current Facility-Administered Medications   Medication Dose Route Frequency   . acetaminophen (TYLENOL) tablet  500 mg Oral Q4H PRN   . adult custom variable rate parenteral nutrition   Intravenous Continuous   . adult custom variable rate parenteral nutrition   Intravenous Continuous   . amLODIPine (NORVASC) tablet  5 mg Oral Daily   . atorvastatin (LIPITOR) tablet  40 mg Oral Daily with Breakfast   . benzonatate (TESSALON) capsule  100 mg Oral Q8H PRN   . D5W 250 mL flush bag   Intravenous Q15 Min PRN   . diatrizoate meglumine & sodium oral solution  8 mL Oral Give in Radiology    . elvitegravir-cobicistat-emtricitrabine-tenofovir (GENVOYA) 150-150-200-10 mg per tablet  1 Tablet Oral Daily   . fluconazole (DIFLUCAN) tablet  400 mg Oral Daily   . gabapentin (NEURONTIN) capsule  300 mg Oral 3x/day   . heparin 25,000 units in NS 250 mL infusion  18 Units/kg/hr (Adjusted) Intravenous Continuous   . insulin NPH human 100 units/mL injection  15 Units Subcutaneous Daily before Breakfast   . insulin NPH human 100 units/mL injection  25 Units Subcutaneous Daily after Dinner   . insulin R human (HUMULIN R) 100 units/mL injection - for tube feed coverage  8 Units Subcutaneous 3x/day   . labetalol (TRANDATE) 5 mg/mL injection  10 mg Intravenous Q2H PRN   . melatonin tablet  6 mg Oral NIGHTLY   . meropenem (MERREM) 1 g in NS 100 mL IVPB minibag  1 g Intravenous Q8H   . mirtazapine (REMERON) tablet  7.5 mg Oral NIGHTLY   . multivitamin-minerals-folic acid-lycopene-lutein (  CERTAVITE SENIOR) tablet  1 Tablet Oral Q24H   . NS 250 mL flush bag   Intravenous Q15 Min PRN   . NS flush syringe  10-30 mL Intracatheter Q8HRS   . NS flush syringe  20-30 mL Intracatheter Q1 MIN PRN   . NS flush syringe  2-6 mL Intracatheter Q8HRS   . NS flush syringe  2-6 mL Intracatheter Q1 MIN PRN   . pantoprazole (PROTONIX) delayed release tablet  40 mg Oral Q12H   . prochlorperazine (COMPAZINE) 5 mg/mL injection  10 mg Intravenous Q6H PRN   . promethazine (PHENERGAN) tablet  6.25 mg Oral Q6H PRN   . scopolamine 1 mg over 3 days transdermal patch  1 Patch Transdermal Q72H   . sennosides-docusate sodium (SENOKOT-S) 8.6-'50mg'$  per tablet  1 Tablet Oral 2x/day   . sodium hypochlorite (DAKINS) 0.25% (half strength) irrigation   Irrigation Q12H   . SSIP insulin R human (HUMULIN R) 100 units/mL injection  0-12 Units Subcutaneous Q6H PRN   . vancomycin (VANCOCIN) 750 mg in NS 250 mL IVPB  750 mg Intravenous Q12H   . Vancomycin IV - Pharmacist to Dose per Protocol   Does not apply Daily PRN       Donna Christen, MD  General Surgery //  PGY-1  [place]

## 2021-05-24 NOTE — Care Plan (Signed)
[facility]  Physical Therapy Progress Note      Patient Name: [name]  Date of Birth: 1945/07/25  Height:  175.3 cm (5' 9.02")  Weight:  72.1 kg (158 lb 15.2 oz)  Room/Bed: 961/A  Payor: [facility] [employer] / Plan: [place] VACCN/OPTUM / Product Type: Managed Care /     Assessment:     [name] was alert and agreeable to PT tx. He demonstrated overall improvement in mobility and activity tol compared to recent txs. He was dizzy with initial position changes but was able to cont activity with improvement in symptoms. He continues to demonstrate generalized weakness, decreased activity tol, impaired balance, and impaired mobility. His mobility is not currently adequate for safe d/c to home but he has good rehab potential. Rec IRF at d/c.    Discharge Needs:   Equipment Recommendation: TBD  Discharge Disposition: inpatient rehabilitation facility    JUSTIFICATION OF DISCHARGE RECOMMENDATION   Based on current diagnosis, functional performance prior to admission, and current functional performance, this patient requires continued PT services in inpatient rehabilitation facility in order to achieve significant functional improvements in these deficit areas: aerobic capacity/endurance, gait, locomotion, and balance, muscle performance.      Plan:   Continue to follow patient according to established plan of care.  The risks/benefits of therapy have been discussed with the patient/caregiver and he/she is in agreement with the established plan of care.     Subjective & Objective:        05/24/21 1410   Therapist Pager   PT Assigned/ Pager # [name] [phone]   Rehab Session   Document Type therapy progress note (daily note)   Total PT Minutes: 21   Patient Effort good   Symptoms Noted During/After Treatment fatigue;dizziness   Symptoms Noted Comment dizziness with initial sitting and standing.   General Information   Patient Profile Reviewed yes   Patient/Family/Caregiver  Comments/Observations Pt was sleeping in bed. He easily woke and was agreeable to PT tx. Wife was at b/s and supportive.   Medical Lines PICC Line  (ostomy collection bag)   Respiratory Status room air   Existing Precautions/Restrictions contact isolation;fall precautions;full code   General Observations of Patient Pt was seen at b/s on 9e for co-tx with OT with permission of b/s nurse.   Mutuality/Individual Preferences   Individualized Care Needs OOB with FWW and assist x2; goes by [facility]   Pre Treatment Status   Pre Treatment Patient Status Patient supine in bed;Call light within reach;Telephone within reach;Sitter select activated   Support Present Pre Treatment  Family present   Communication Pre Treatment  Nurse   Cognitive Assessment/Interventions   Behavior/Mood Observations alert;cooperative   Attention WNL/WFL   Follows Commands WFL   Pain Assessment   Pretreatment Pain Rating 0/10 - no pain   Posttreatment Pain Rating 0/10 - no pain   Mobility Assessment/Training   Comment Pt transferred to sitting EOB. Reported dizziness and sat briefly. Stood to [employer]. Still dizzy. Paused briefly then took a few steps to recliner chair. He completed ADLs seated at sink. Stood briefly at [facility] to use urinal and for hygiene care then ambulated in hall to point of fatigue. Returned to room via chair and was reclined in chair at b/s at end of tx.   Bed Mobility Assessment/Treatment   Bed Mobility, Assistive Device Head of Bed Elevated;bed rails   Supine-Sit Independence contact guard assist   Sit to Supine, Independence  not tested   Safety Issues decreased use of arms for pushing/pulling;decreased use of legs for bridging/pushing;impaired trunk control for bed mobility   Impairments balance impaired;coordination impaired;endurance;postural control impaired;strength decreased   Comment Effort was slow and labored but pt was able to transfer to EOB with only CGA.   Transfer Assessment/Treatment   Sit-Stand Independence minimum  assist (75% patient effort);verbal cues required   Stand-Sit Independence minimum assist (75% patient effort);verbal cues required   Sit-Stand-Sit, Assist Device walker, front wheeled   Bed-Chair Independence minimum assist (75% patient effort)   Bed-Chair-Bed Assist Device walker, front wheeled   Transfer Safety Issues balance decreased during turns;step length decreased;weight-shifting ability decreased   Transfer Impairments balance impaired;endurance;postural control impaired;strength decreased   Transfer Comment Pt needed verbal cues for hand placement.   Gait Assessment/Treatment   Independence  contact guard assist   Assistive Device  walker, front wheeled   Distance in Feet 3 ft in room; ~ 100 ft in hall with FWW, assist x 1, and chair follow   Gait Speed decreased but improved from last week   Deviations  cadence decreased;double stance time increased;limb motion velocity decreased;step length decreased;stride length decreased;weight-shifting ability decreased;swing-to-stance ratio decreased   Safety Issues  step length decreased;weight-shifting ability decreased   Impairments  balance impaired;endurance;postural control impaired;strength decreased   Comment Pt's cadence improved once out in Psychologist, prison and probation services   Comment with FWW for standing   Sitting Balance: Static fair + balance   Sitting, Dynamic (Balance) fair - balance   Sit-to-Stand Balance poor + balance   Standing Balance: Static fair - balance   Standing Balance: Dynamic poor balance   Systems Impairment Contributing to Balance Disturbance musculoskeletal   Identified Impairments Contributing to Balance Disturbance impaired coordination;impaired postural control;decreased strength   Post Treatment Status   Post Treatment Patient Status Patient sitting in bedside chair or w/c;Call light within reach;Telephone within reach;Sitter select activated   Support Present Post Treatment  Family present;Nurse present   Psychologist, occupational   Plan of Care Review   Plan Of Care Reviewed With patient   Basic Mobility Am-PAC/6Clicks Score (APPROVED Staff)   Turning in bed without bedrails 4   Lying on back to sitting on edge of flat bed 3   Moving to and from a bed to a chair 3   Standing up from chair 3   Walk in room 3   Climbing 3-5 steps with railing 2   6 Clicks Raw Score total 18   Standardized (t-scale) score 41.05   CMS 0-100% Score 40.47   CMS Modifier CK   Patient Mobility Goal (JHHLM) 6- Walk 10 steps or more 2X/day   Exercise/Activity Level Performed 7- Walked 25 feet or more   Physical Therapy Clinical Impression   Assessment [name] was alert and agreeable to PT tx. He demonstrated overall improvement in mobility and activity tol compared to recent txs. He was dizzy with initial position changes but was able to cont activity with improvement in symptoms. He continues to demonstrate generalized weakness, decreased activity tol, impaired balance, and impaired mobility. His mobility is not currently adequate for safe d/c to home but he has good rehab potential. Rec IRF at d/c.   Anticipated Equipment Needs at Discharge (PT) TBD   Anticipated Discharge Disposition inpatient rehabilitation facility       Therapist:   Norlene Duel, PT   Pager #: [phone]

## 2021-05-24 NOTE — Care Plan (Signed)
Medical Nutrition Therapy Calorie Count Note  I: Estimated Needs:  Energy Calorie Requirements: 1601-0932 cal (28-30 cal/75.9 kg)  Protein Requirements (gms/day): 88-110 g prot (1.2-1.5 g/73.7 kg)  Fluid Requirements: 2100-2300 ml (28-30 ml/75.9 k)    Calorie Count day 1: 573 calories, 37 grams of protein, which met 25-27 % of estimated calorie needs and 34-42 % of estimated protein needs. Pt consumed 2 ensures providing 320 calories.    Calorie Count day 2: Pt was NPO.    Calorie Count day 3: 350 calories, 11 grams of protein, which met 15-17 % of estimated calorie needs and 1-13 % of estimated protein needs. Pt had no diner documented.    Calorie Count day 4: 322 calories, 24 grams of protein, which met 14-15 % of estimated calorie needs and 22-27 % of estimated protein needs. Pt only had breakfast documented.    Will extend today (4/24) and through the end of tomorrow (4/25).    Please document % intake of all food, drinks and dietary supplements to help determine appropriate nutrition intervention.    [name], [place]  05/24/2021, 10:01  Pager#[phone]

## 2021-05-24 NOTE — Care Management Notes (Signed)
[place] Management Note    Patient Name: [name]  Date of Birth: 03-Nov-1945  Sex: male  Date/Time of Admission: 04/03/2021  2:01 PM  Room/Bed: 961/A  Payor: [facility] [employer] / Plan: [place]olayne [place] VACCN/OPTUM / Product Type: Managed Care /    LOS: 51 days   Primary Care Providers:  [place] (General)    Admitting Diagnosis:  Intra abdominal hemorrhage [R58]    Assessment:      05/24/21 1529   Assessment Details   Assessment Type Continued Assessment   Date of Care Management Update 05/24/21   Date of Next DCP Update 05/27/21   Care Management Plan   Discharge Planning Status plan in progress   Projected Discharge Date 05/26/21   Discharge plan discussed with: Patient   CM will evaluate for rehabilitation potential yes   Patient choice offered to patient/family yes   Form for patient choice reviewed/signed and on chart no   Facility or Agency Preferences [facility]   Discharge Needs Assessment   Discharge Facility/Level of Care Needs Acute Rehab Placement/Return (not psych)(code 62)     Per service, pt not medically ready for d/c at this time, possibly later this week.  MSW called the [facility] [phone]) and left message with their Care in the Community to check that [place] that was obtained on 4/10 is still good even though the pt hasn't left to [facility] yet.  Awaiting call back.  MSW spoke to [employer] with [facility], she was going to check the auth on their end of things as well.      Discharge Plan:  Acute Rehab Placement/Return (not psych) (code 24)      The patient will continue to be evaluated for developing discharge needs.     Case Manager: Joella Prince, [place]  Phone: [phone]

## 2021-05-24 NOTE — Nurses Notes (Addendum)
Please call about PT heparin drip. Thanks    Service paged [name]    Service stated to restart aPTT new now since PTT are off schedule.

## 2021-05-24 NOTE — Care Plan (Signed)
[facility]  Medical Nutrition Therapy Follow Up    SUBJECTIVE: Per calorie count sheet, although not well documented over the weekend, seems to show pt meeting less than 40% of nutrient needs. Pt stated appetite isn't great. He's been having nausea off and on and reflux with food and pills. Stated he pretty much only consumes the 3 Ensures per day and very little solid foods. Discussed tips for eating with nausea and reflux present. Pt ordered automatic house trays and asking for meals to be delivered at later times (9:30am, 1pm, 6pm). Would like peach or strawberry yogurt, chicken broth, apple juice at each breakfast meal and really enjoys choc ice cream. Family plans to bring in pancakes from [employer] today for pt to try.     Calorie count results:  Calorie Count day1:573calories, 37grams of protein, which met 25-27% of estimated calorie needs and 34-42% of estimated protein needs. Pt consumed 2 ensures providing 320 calories.    Calorie Count day2: Pt was NPO.    Calorie Count day3:350calories, 11grams of protein, which met 15-17% of estimated calorie needs and 1-13% of estimated protein needs. Pt had no dinner documented.    Calorie Count day4:322calories, 24grams of protein, which met 14-15% of estimated calorie needs and 22-27% of estimated protein needs. Pt only had breakfast documented.    OBJECTIVE:     Current Diet Order/Nutrition Support:  MNT PROTOCOL FOR DIETITIAN  DIETARY ORAL SUPPLEMENTS Oral Supplements with tray: Ensure High Protein-Vanilla; BREAKFAST/LUNCH/DINNER; 1 Each  DIET DIABETIC CARDIAC Calorie amount: CC 2000  ROOM SERVICE:  SEND AUTOMATIC HOUSE TRAY  adult custom variable rate parenteral nutrition     Height Used for Calculations: 175.3 cm (5' 9.02")  Weight Used For Calculations: 75.9 kg (167 lb 5.3 oz) (standing weight on 3/3)  BMI (kg/m2): 24.75  BMI Assessment: BMI 18.5-24.9: normal  Ideal Body Weight (IBW) (kg): 73.73  % Ideal Body  Weight: 102.94     Usual Body Weight: 81.3 kg (179 lb 3.7 oz)  Usual Body Weight: 81.3 kg (179 lb 3.7 oz)  Weight Loss:  (intentional weight loss)    Re-assessed needs if applicable:    Energy Calorie Requirements: 2100-2300 cal (28-30 cal/75.9 kg)  Protein Requirements (gms/day): 88-110 g prot (1.2-1.5 g/73.7 kg)   Fluid Requirements: 2100-2300 ml (28-30 ml/75.9 k)     Comments: 76 y.o.malew/ 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 He underwent massive transfusion protocol and ex lap on 04/03/21 at an outside facility with ligation of a bleeding vessel in the liver bed. Patient was subsequently transferred to [facility] postoperaively, intubated. 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    Endocrinology following for diabetes management     Plan/Interventions :   - Recommend removing cardiac restriction to promote po intake and continue 2200cc diabetic diet   - D/c automatic house trays and change to room service: menu choices so pt can choose foods he likes  - Continue Ensure HP (lower in carbs) TID to promote po intake  - Messaged kitchen pt's preferred meal times (9:30am, 1pm, 6pm) and pt's preferred breakfast peach or strawberry yogurt, chicken broth, apple juice  - Continue TPN at goal due to poor po intake:    Cycled for 14 hours at night (70 ml the first  and last hour and 140 ml x 12 hours)  2100 kcal /day = 28 kcal/kg  100 grams Protein = 1.4 grams/ kg  860Dextrose kcals, GIR 2.3  840IL kcals or 1.1grams Fat/kg (40% of total calories)    - Continue to Monitor potassium, magnesium and phosphorus daily along with blood sugars and BMP and electrolytes can be adjusted in TPN as needed  - Continue to Monitor LFTs and triglycerides weekly.    - Both WNL 4/17  - Continue po  daily multivitamin   - Continue anti emetics and anti reflux medications as pt stated these are his main obstacles to eating  - Discussed tips for eating with nausea and reflux present (bland, cold or room temp foods, small, frequent meals and snacks, avoid spicy or acidic foods, and remain sitting up at least 1 hour after eating, etc)  - Monitor weight twice per week - generalized 1+ edema present  - Continue Remeron as medically appropriate - can help stimulate appetite  - CC extended to 4/25 as meals were missing on sheet over the weekend    Nutrition Diagnosis: Altered GI function related to midline fistula draining stool as evidenced by need for TPN (in progress but starting to improve)      Micheline Chapman, RDLD  05/24/2021, 09:23  Pager [place]

## 2021-05-24 NOTE — Nurses Notes (Signed)
PTT resulted at 1038 is 90.3. Therapeutic result x1. No changes made. Next PTT ordered for 1630 per protocol.

## 2021-05-24 NOTE — Care Plan (Signed)
[facility]  Occupational Therapy Progress Note    Patient Name: [name]  Date of Birth: [birth year]  Height:  175.3 cm (5' 9.02")  Weight:  72.1 kg (158 lb 15.2 oz)  Room/Bed: 961/A  Payor: [employer] / Plan: [place] [facility] / Product Type: Managed Care /     Assessment:    [name] performed well during OT intervention this date. Pt is demonstrating improvements regarding functional activity tolerance, evidenced via ability to complete functional bed mobility, STS transfers, standing toileting, seated UB grooming ADLs, and ambulation of 100 feet with use of FWW and CGA-min A provided for balance, steadying, safety. Mildly limited by fatigue and initial dizziness this date. From OT perspective, continue to recommend acute rehab placement upon d/c once medically appropriate. OT will continue to follow.      Discharge Needs:   Equipment Recommendation: to be determined    Discharge Disposition:  inpatient rehabilitation facility     JUSTIFICATION OF DISCHARGE RECOMMENDATION   Based on current diagnosis, functional performance prior to admission, and current functional performance, this patient requires continued OT services in inpatient rehabilitation facility in order to achieve significant functional improvements.    Plan:   Continue to follow patient according to established plan of care.  The risks/benefits of therapy have been discussed with the patient/caregiver and he/she is in agreement with the established plan of care.     Subjective & Objective:      05/24/21 1409   Therapist Pager   OT Assigned/ Pager # [name] [phone]   Rehab Session   Document Type therapy progress note (daily note)   Total OT Minutes: 23   Patient Effort good   Symptoms Noted During/After Treatment fatigue;dizziness   General Information   Patient Profile Reviewed yes   Onset of Illness/Injury or Date of Surgery 04/03/21   Medical Lines PICC Line;Peripheral  Drain;Telemetry   Respiratory Status room air   Existing Precautions/Restrictions full code;fall precautions;contact isolation   Pre Treatment Status   Pre Treatment Patient Status Patient supine in bed;Call light within reach;Telephone within reach;Sitter select activated;Nurse approved session   Support Present Pre Treatment  Family present   Communication Pre Treatment  Nurse   Mutuality/Individual Preferences   Individualized Care Needs OOB with FWW and assist x2   Patient-Specific Goals (Include Timeframe) To go to rehab this week   Plan of Care Reviewed With patient;spouse   Vital Signs   O2 Delivery Pre Treatment room air   O2 Delivery Post Treatment room air   Pain Assessment   Pre/Posttreatment Pain Comment Pt with no c/o pain this date.   Coping/Psychosocial   Observed Emotional State calm;cooperative   Verbalized Emotional State acceptance   Coping/Psychosocial Response Interventions   Plan Of Care Reviewed With patient   Cognitive Assessment/Interventions   Behavior/Mood Observations alert;cooperative   Attention WNL/WFL   Follows Commands WNL;WFL   Mobility Assessment/Training   Mobility Comment Pt ambulated functional mobility distance of 100 feet with use of FWW, CGA provided for balance, steadying and close chair follow for safety.   Bed Mobility Assessment/Treatment   Bed Mobility, Assistive Device Head of Bed Elevated   Supine-Sit Independence contact guard assist   Sit to Supine, Independence not tested   Impairments balance impaired;endurance;postural control impaired;strength decreased   Transfer Assessment/Treatment   Sit-Stand Independence minimum assist (75% patient effort)   Stand-Sit Independence minimum assist (75% patient effort)   Sit-Stand-Sit, Assist Device walker, front  wheeled   Bed-Chair Independence minimum assist (75% patient effort);verbal cues required   Bed-Chair-Bed Assist Device walker, front wheeled   Transfer Impairments balance impaired;coordination  impaired;endurance;postural control impaired;strength decreased   ADL Assessment/Intervention   ADL Comments seated grooming ADLs at sink level   Toileting Assessment/Training   Assistive Devices urinal   Position supported standing   Independence Level  contact guard assist   Grooming/Oral Hygeine  Assessment/Training   Position supported sitting   Independence Level set up required;standby assist   Impairments activity tolerance impaired;balance impaired;strength decreased   Motor Skills/Interventions   Additional Documentation Functional Endurance (Group)   Balance Skill Training   Comment with use of FWW   Sitting Balance: Static fair + balance   Sitting, Dynamic (Balance) fair + balance   Sit-to-Stand Balance poor - balance   Standing Balance: Static fair - balance   Standing Balance: Dynamic poor - balance   Systems Impairment Contributing to Balance Disturbance musculoskeletal   Functional Endurance Training   Comment, Functional Endurance improving   Post Treatment Status   Post Treatment Patient Status Patient sitting in bedside chair or w/c;Call light within reach;Telephone within reach;Sitter select activated   Support Present Post Treatment  Nurse present   Financial trader Nurse   Clinical Impression   Functional Level at Time of Session [name] performed well during OT intervention this date. Pt is demonstrating improvements regarding functional activity tolerance, evidenced via ability to complete functional bed mobility, STS transfers, standing toileting, seated UB grooming ADLs, and ambulation of 100 feet with use of FWW and CGA-min A provided for balance, steadying, safety. Mildly limited by fatigue and initial dizziness this date. From OT perspective, continue to recommend acute rehab placement upon d/c once medically appropriate. OT will continue to follow.   Anticipated Equipment Needs at Discharge to be determined   Anticipated Discharge Disposition inpatient rehabilitation  facility   Highest level of Mobility score   Exercise/Activity Level Performed 7- Walked 25 feet or more         Therapist:   Chayil [name], MOT, OTR/L  Pager #: [phone]

## 2021-05-25 ENCOUNTER — Inpatient Hospital Stay (HOSPITAL_COMMUNITY): Payer: 59

## 2021-05-25 DIAGNOSIS — I6522 Occlusion and stenosis of left carotid artery: Secondary | ICD-10-CM

## 2021-05-25 DIAGNOSIS — M50321 Other cervical disc degeneration at C4-C5 level: Secondary | ICD-10-CM

## 2021-05-25 DIAGNOSIS — M5031 Other cervical disc degeneration,  high cervical region: Secondary | ICD-10-CM

## 2021-05-25 DIAGNOSIS — Z8673 Personal history of transient ischemic attack (TIA), and cerebral infarction without residual deficits: Secondary | ICD-10-CM

## 2021-05-25 DIAGNOSIS — I6521 Occlusion and stenosis of right carotid artery: Secondary | ICD-10-CM

## 2021-05-25 DIAGNOSIS — M50322 Other cervical disc degeneration at C5-C6 level: Secondary | ICD-10-CM

## 2021-05-25 DIAGNOSIS — I672 Cerebral atherosclerosis: Secondary | ICD-10-CM

## 2021-05-25 DIAGNOSIS — M4802 Spinal stenosis, cervical region: Secondary | ICD-10-CM

## 2021-05-25 DIAGNOSIS — M542 Cervicalgia: Secondary | ICD-10-CM

## 2021-05-25 DIAGNOSIS — R251 Tremor, unspecified: Secondary | ICD-10-CM

## 2021-05-25 DIAGNOSIS — Z90410 Acquired total absence of pancreas: Secondary | ICD-10-CM

## 2021-05-25 DIAGNOSIS — K8681 Exocrine pancreatic insufficiency: Secondary | ICD-10-CM

## 2021-05-25 LAB — LIPID PANEL
CHOL/HDL RATIO: 4.3
CHOLESTEROL: 108 mg/dL (ref 100–200)
HDL CHOL: 25 mg/dL — ABNORMAL LOW (ref >=50)
LDL CALC: 45 mg/dL (ref <100)
NON-HDL: 83 mg/dL (ref <=190)
TRIGLYCERIDES: 240 mg/dL — ABNORMAL HIGH (ref <150)
VLDL CALC: 33 mg/dL — ABNORMAL HIGH (ref <30)

## 2021-05-25 LAB — POC BLOOD GLUCOSE (RESULTS)
GLUCOSE, POC: 113 mg/dl — ABNORMAL HIGH (ref 70–105)
GLUCOSE, POC: 144 mg/dl — ABNORMAL HIGH (ref 70–105)
GLUCOSE, POC: 170 mg/dl — ABNORMAL HIGH (ref 70–105)
GLUCOSE, POC: 195 mg/dl — ABNORMAL HIGH (ref 70–105)
GLUCOSE, POC: 196 mg/dl — ABNORMAL HIGH (ref 70–105)
GLUCOSE, POC: 232 mg/dl — ABNORMAL HIGH (ref 70–105)
GLUCOSE, POC: 96 mg/dl (ref 70–105)

## 2021-05-25 LAB — BASIC METABOLIC PANEL
ANION GAP: 6 mmol/L (ref 4–13)
BUN/CREA RATIO: 37 — ABNORMAL HIGH (ref 6–22)
BUN: 26 mg/dL — ABNORMAL HIGH (ref 8–25)
CALCIUM: 9 mg/dL (ref 8.8–10.2)
CHLORIDE: 103 mmol/L (ref 96–111)
CO2 TOTAL: 27 mmol/L (ref 23–31)
CREATININE: 0.7 mg/dL — ABNORMAL LOW (ref 0.75–1.35)
ESTIMATED GFR: >90 mL/min/BSA (ref >=60)
GLUCOSE: 127 mg/dL — ABNORMAL HIGH (ref 65–125)
POTASSIUM: 3.5 mmol/L (ref 3.5–5.1)
SODIUM: 136 mmol/L (ref 136–145)

## 2021-05-25 LAB — THYROXINE, FREE (FREE T4): THYROXINE (T4), FREE: 0.68 ng/dL — ABNORMAL LOW (ref 0.70–1.25)

## 2021-05-25 LAB — CBC
HCT: 28.5 % — ABNORMAL LOW (ref 38.9–52.0)
HGB: 8.9 g/dL — ABNORMAL LOW (ref 13.4–17.5)
MCH: 27.1 pg (ref 26.0–32.0)
MCHC: 31.2 g/dL (ref 31.0–35.5)
MCV: 86.6 fL (ref 78.0–100.0)
PLATELETS: 264 10*3/uL (ref 150–400)
RBC: 3.29 10*6/uL — ABNORMAL LOW (ref 4.50–6.10)
RDW-CV: 17.4 % — ABNORMAL HIGH (ref 11.5–15.5)
WBC: 13.3 10*3/uL — ABNORMAL HIGH (ref 3.7–11.0)

## 2021-05-25 LAB — THYROID STIMULATING HORMONE WITH FREE T4 REFLEX: TSH: 6.936 u[IU]/mL — ABNORMAL HIGH (ref 0.430–3.550)

## 2021-05-25 LAB — MAGNESIUM: MAGNESIUM: 2.2 mg/dL (ref 1.8–2.6)

## 2021-05-25 LAB — VANCOMYCIN, TROUGH: VANCOMYCIN TROUGH: 17.3 ug/mL (ref 10.0–20.0)

## 2021-05-25 LAB — PHOSPHORUS: PHOSPHORUS: 3.4 mg/dL (ref 2.3–4.0)

## 2021-05-25 LAB — PTT (PARTIAL THROMBOPLASTIN TIME): APTT: 46.4 seconds — ABNORMAL HIGH (ref 24.2–37.5)

## 2021-05-25 MED ORDER — IOPAMIDOL 370 MG IODINE/ML (76 %) INTRAVENOUS SOLUTION
50.0000 mL | INTRAVENOUS | Status: AC
Start: 2021-05-25 — End: 2021-05-25
  Administered 2021-05-25: 50 mL via INTRAVENOUS

## 2021-05-25 MED ORDER — VANCOMYCIN 10 GRAM INTRAVENOUS SOLUTION
500.0000 mg | Freq: Two times a day (BID) | INTRAVENOUS | Status: AC
Start: 2021-05-25 — End: 2021-05-27
  Administered 2021-05-25: 0 mg via INTRAVENOUS
  Administered 2021-05-25 – 2021-05-26 (×3): 500 mg via INTRAVENOUS
  Administered 2021-05-26 (×2): 0 mg via INTRAVENOUS
  Administered 2021-05-27 (×3): 500 mg via INTRAVENOUS
  Administered 2021-05-27: 0 mg via INTRAVENOUS
  Administered 2021-05-27: 500 mg via INTRAVENOUS
  Administered 2021-05-27: 0 mg via INTRAVENOUS
  Administered 2021-05-27: 500 mg via INTRAVENOUS
  Filled 2021-05-25 (×6): qty 5

## 2021-05-25 MED ORDER — POTASSIUM CHLORIDE 2 MEQ/ML INTRAVENOUS SOLUTION
INTRAVENOUS | Status: AC
Start: 2021-05-25 — End: 2021-05-26
  Filled 2021-05-25: qty 1000

## 2021-05-25 MED ORDER — INSULIN REGULAR HUMAN 100 UNIT/ML INJECTION - FOR TUBE FEED
10.0000 [IU] | Freq: Three times a day (TID) | INTRAMUSCULAR | Status: DC
Start: 2021-05-25 — End: 2021-05-26
  Administered 2021-05-25 – 2021-05-26 (×3): 10 [IU] via SUBCUTANEOUS

## 2021-05-25 MED ORDER — GADOBUTROL 7.5 MMOL/7.5 ML (1 MMOL/ML) INTRAVENOUS SOLUTION
7.0000 mL | INTRAVENOUS | Status: AC
Start: 2021-05-25 — End: 2021-05-25
  Administered 2021-05-25: 7 mL via INTRAVENOUS

## 2021-05-25 MED ORDER — INSULIN NPH ISOPHANE U-100 HUMAN 100 UNIT/ML SUBCUTANEOUS SUSPENSION: 5.0000 [IU] | Freq: Once | SUBCUTANEOUS | Status: DC

## 2021-05-25 MED ORDER — LEVOTHYROXINE 88 MCG TABLET
88.0000 ug | ORAL_TABLET | Freq: Every morning | ORAL | Status: DC
Start: 2021-05-26 — End: 2021-05-28
  Administered 2021-05-26 – 2021-05-28 (×3): 88 ug via ORAL
  Filled 2021-05-25 (×3): qty 1

## 2021-05-25 MED ORDER — APIXABAN 2.5 MG TABLET
2.5000 mg | ORAL_TABLET | Freq: Two times a day (BID) | ORAL | Status: DC
Start: 2021-05-25 — End: 2021-05-25
  Filled 2021-05-25: qty 1

## 2021-05-25 MED ORDER — APIXABAN 2.5 MG TABLET
2.5000 mg | ORAL_TABLET | Freq: Two times a day (BID) | ORAL | Status: DC
Start: 2021-05-25 — End: 2021-05-28
  Administered 2021-05-25 – 2021-05-28 (×7): 2.5 mg via ORAL
  Filled 2021-05-25 (×6): qty 1

## 2021-05-25 NOTE — Progress Notes (Signed)
[facility]  Surgical Oncology  Progress Note      [name], [name], 76 y.o. male  Date of Birth:  1945/03/22  Date of Admission:  04/03/2021  Date of service: 05/25/2021    Assessment:  This is a 76 y.o. 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 He underwent massive transfusion protocol and ex lap on 04/03/21 at an outside facility with ligation of a bleeding vessel in the liver bed. Patient was subsequently transferred to [facility] postoperaively, intubated. Patient was subsequently extubated later that day on 3/4. 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 He failed advancing his diet to fulls the week of 4/08, backed off to TPN / clears after he developed severe nausea. Pt is now s/p repeat cystogastrostomy with GI on 4/20.     Plan/Recommendations:  -Diabetic cardiac diet as tolerated  -Continue calorie count  -Renew TPN today - cycle at night  -Persistent leukocytosis 13.3 improved from 18.2  -Pt underwent repeat cystogastrostomy and necrosectomy 4/20  -Midline wound (superior portion)   -Nursing to perform BID packing changes with kerlix and dakins 0.25% - ordered  -Resume home Eliquis 2.5 BID  - Positive blood culture    - One of two bottles positive for GPCs   - Repeat blood cultures 05/17/21 NGTD   - Continue antibiotics at this time, deescalate when able (currently on vancomycin and merrem)   - Appreciate ID recs  - Nausea control prn  - Peripancreatic fluid collections   - Advanced GI performed EUS with cystogastrostomy 4/6 and repeat 4/20              - Blood cultures 3/9: + pseudomonas    - Cultures form R JP: Pseudomonas and enterococcus              - ID consulted; recommend  vancomycin and meropenum and diflucan until 5/11  - PE   - s/p IVC filter placement 3/17  - Fine tremors:    - MRI brain and spine yesterday   - Appreciate Neuro recs  - Midline fistula with ostomy bag to monitor output  - Appreciate endocrine recs regarding glucose control  - OOB TID, encourage ambulation  - Pulmonary toilet, keep IS accessible at bedside  - PT/OT ordered, encourage pt to work with therapies  - Dispo: Floor status. Appreciate care management continued evaluation and assistance.      Subjective:   Resting comfortably in bed this am.     Objective  Filed Vitals:    05/24/21 1643 05/24/21 1920 05/24/21 2311 05/25/21 0340   BP: (!) 136/92 116/68 (!) 145/80 (!) 148/83   Pulse: (!) 115 99 90 100   Resp: '16 16 16 16   '$ Temp: 36.9 C (98.4 F) 36.7 C (98.1 F) 36.5 C (97.7 F) 36.7 C (98.1 F)   SpO2: 92% 92% 92% 92%     Physical Exam:   GEN:  AOx4, resting in bed, appears comfortable  PULM: Normal respiratory effort. Equal/symmetric chest rise.  CV:  Pink, well perfused  ABD:   Abdomen soft, nontender, nondistended. Lower midline incision with Eakins pouch with minimal thin output  MS: Atraumatic. Moves all extremities.  NEURO:   Alert  and oriented to person, place and time. Cranial nerves grossly intact. Responds to all questions appropriately. Fine tremors of hands/arms noted b/l UE  Integumentary:  Pink, warm, and dry  PSYCHOSOCIAL: Anxious but cooperative. Normal affect.     Labs  Results for orders placed or performed during the hospital encounter of 04/03/21 (from the past 24 hour(s))   PTT (PARTIAL THROMBOPLASTIN TIME) - ONCE   Result Value Ref Range    APTT 90.3 (H) 24.2 - 37.5 seconds    Narrative    Therapeutic range for unfractionated heparin is 60-100 seconds.   PTT (PARTIAL THROMBOPLASTIN TIME) - ONCE   Result Value Ref Range    APTT 97.4 (H) 24.2 - 37.5 seconds    Narrative    Therapeutic range for unfractionated heparin is 60-100 seconds.   PTT (PARTIAL THROMBOPLASTIN TIME) - ONCE    Result Value Ref Range    APTT 46.4 (H) 24.2 - 37.5 seconds    Narrative    Therapeutic range for unfractionated heparin is 60-100 seconds.   VANCOMYCIN, TROUGH   Result Value Ref Range    VANCOMYCIN TROUGH 17.3 10.0 - 20.0 ug/mL   BASIC METABOLIC PANEL   Result Value Ref Range    SODIUM 136 136 - 145 mmol/L    POTASSIUM 3.5 3.5 - 5.1 mmol/L    CHLORIDE 103 96 - 111 mmol/L    CO2 TOTAL 27 23 - 31 mmol/L    ANION GAP 6 4 - 13 mmol/L    CALCIUM 9.0 8.8 - 10.2 mg/dL    GLUCOSE 127 (H) 65 - 125 mg/dL    BUN 26 (H) 8 - 25 mg/dL    CREATININE 0.70 (L) 0.75 - 1.35 mg/dL    BUN/CREA RATIO 37 (H) 6 - 22    ESTIMATED GFR >90 >=60 mL/min/BSA    Narrative    Lipemia can alter results at this level (slight).   PHOSPHORUS   Result Value Ref Range    PHOSPHORUS 3.4 2.3 - 4.0 mg/dL   MAGNESIUM   Result Value Ref Range    MAGNESIUM 2.2 1.8 - 2.6 mg/dL    Narrative    Lipemia can alter results at this level (slight).   CBC   Result Value Ref Range    WBC 13.3 (H) 3.7 - 11.0 x10^3/uL    RBC 3.29 (L) 4.50 - 6.10 x10^6/uL    HGB 8.9 (L) 13.4 - 17.5 g/dL    HCT 28.5 (L) 38.9 - 52.0 %    MCV 86.6 78.0 - 100.0 fL    MCH 27.1 26.0 - 32.0 pg    MCHC 31.2 31.0 - 35.5 g/dL    RDW-CV 17.4 (H) 11.5 - 15.5 %    PLATELETS 264 150 - 400 x10^3/uL    Narrative    Mean Platelet Volume - Not Reported  Platelet Count -Results reviewed.   POC BLOOD GLUCOSE (RESULTS)   Result Value Ref Range    GLUCOSE, POC 86 70 - 105 mg/dl   POC BLOOD GLUCOSE (RESULTS)   Result Value Ref Range    GLUCOSE, POC 155 (H) 70 - 105 mg/dl   POC BLOOD GLUCOSE (RESULTS)   Result Value Ref Range    GLUCOSE, POC 116 (H) 70 - 105 mg/dl   POC BLOOD GLUCOSE (RESULTS)   Result Value Ref Range    GLUCOSE, POC 195 (H) 70 - 105 mg/dl   POC BLOOD GLUCOSE (RESULTS)   Result Value Ref Range    GLUCOSE, POC  196 (H) 70 - 105 mg/dl       I/O:  Date 05/24/21 0700 - 05/25/21 0659 05/25/21 0700 - 05/26/21 0659   Shift 2094-7096 1500-2259 2300-0659 24 Hour Total 2836-6294 1500-2259 2300-0659 24  Hour Total   INTAKE   P.O. 320   320         Oral 320   320       I.V.(mL/kg/hr) 7654.65(0.35)   1842.17(1.06)         Volume Infused (adult custom variable rate parenteral nutrition) 1842.17   1842.17       IV Piggyback 357.5   357.5         Volume (meropenem (MERREM) 1 g in NS 100 mL IVPB minibag) 100   100         Volume (vancomycin (VANCOCIN) 750 mg in NS 250 mL IVPB) 257.5   257.5       Shift Total(mL/kg) 4656.81(27.51)   7001.74(94.49)       OUTPUT   Urine(mL/kg/hr) 1675(2.9)  250(0.43) 1925(1.11)         Urine 925   925         Urine (Voided) 750  250 1000         Urine Occurrence 2 x   2 x       Drains 0   0         Drain Output (Drain (Miscellaneous) peds ostomy bag Lower;Medial Abdomen) 0   0       Shift Total(mL/kg) 6759(16.38)  250(3.47) 1925(26.7)       Weight (kg) 72.1 72.1 72.1 72.1 72.1 72.1 72.1 72.1       Radiology:  05/24/21 CT C/A/P   1.Postsurgical changes from distal pancreatectomy and splenectomy with complex fluid and multiple upper abdominal organized collections, further detailed above. Left upper quadrant collection has intervally decreased in size. Other collections are similar to minimally decreased in size. No new intra-abdominal collection is identified.  2.Midline abdominal wall incision with underlying fluid and air is also smaller in size when compared to prior examination.  3.Portion of the transverse colon abutting the ventral abdominal wall without frank communication identified.  4.Trace left pleural effusion with bibasilar atelectasis and bibasilar atelectasis.    05/24/21 MRI brain/spine: Read pending    Current Medications:  Current Facility-Administered Medications   Medication Dose Route Frequency   . acetaminophen (TYLENOL) tablet  500 mg Oral Q4H PRN   . adult custom variable rate parenteral nutrition   Intravenous Continuous   . amLODIPine (NORVASC) tablet  5 mg Oral Daily   . apixaban (ELIQUIS) tablet  2.5 mg Oral 2x/day   . atorvastatin (LIPITOR) tablet  40 mg Oral Daily  with Breakfast   . benzonatate (TESSALON) capsule  100 mg Oral Q8H PRN   . D5W 250 mL flush bag   Intravenous Q15 Min PRN   . diatrizoate meglumine & sodium oral solution  8 mL Oral Give in Radiology   . elvitegravir-cobicistat-emtricitrabine-tenofovir (GENVOYA) 150-150-200-10 mg per tablet  1 Tablet Oral Daily   . fluconazole (DIFLUCAN) tablet  400 mg Oral Daily   . gabapentin (NEURONTIN) capsule  300 mg Oral 3x/day   . insulin NPH human 100 units/mL injection  15 Units Subcutaneous Daily before Breakfast   . insulin NPH human 100 units/mL injection  25 Units Subcutaneous Daily after Dinner   . insulin R human (HUMULIN R) 100 units/mL injection - for tube feed coverage  8 Units Subcutaneous 3x/day   .  labetalol (TRANDATE) 5 mg/mL injection  10 mg Intravenous Q2H PRN   . melatonin tablet  6 mg Oral NIGHTLY   . meropenem (MERREM) 1 g in NS 100 mL IVPB minibag  1 g Intravenous Q8H   . mirtazapine (REMERON) tablet  7.5 mg Oral NIGHTLY   . multivitamin-minerals-folic acid-lycopene-lutein (CERTAVITE SENIOR) tablet  1 Tablet Oral Q24H   . NS 250 mL flush bag   Intravenous Q15 Min PRN   . NS flush syringe  10-30 mL Intracatheter Q8HRS   . NS flush syringe  20-30 mL Intracatheter Q1 MIN PRN   . NS flush syringe  2-6 mL Intracatheter Q8HRS   . NS flush syringe  2-6 mL Intracatheter Q1 MIN PRN   . pantoprazole (PROTONIX) delayed release tablet  40 mg Oral Q12H   . prochlorperazine (COMPAZINE) 5 mg/mL injection  10 mg Intravenous Q6H PRN   . promethazine (PHENERGAN) tablet  6.25 mg Oral Q6H PRN   . scopolamine 1 mg over 3 days transdermal patch  1 Patch Transdermal Q72H   . sennosides-docusate sodium (SENOKOT-S) 8.6-'50mg'$  per tablet  1 Tablet Oral 2x/day   . sodium hypochlorite (DAKINS) 0.25% (half strength) irrigation   Irrigation Q12H   . SSIP insulin R human (HUMULIN R) 100 units/mL injection  0-12 Units Subcutaneous Q6H PRN   . vancomycin (VANCOCIN) 500 mg in NS 100 mL IVPB  500 mg Intravenous Q12H   . Vancomycin IV -  Pharmacist to Dose per Protocol   Does not apply Daily PRN       Allene Pyo, PA-C  05/25/2021, 08:44

## 2021-05-25 NOTE — Pharmacy (Signed)
[facility] / [facility]  Therapeutic Drug Monitoring: Vancomycin  05/25/2021      Patient name: [name], [name]  Date of Birth:  [birth year]    Actual Weight:  Weight: 72.1 kg (158 lb 15.2 oz) (05/20/21 1036)     BMI:  BMI (Calculated): 23.51 (05/20/21 1036)      Date RPh Current regimen (including mg/kg) Indication &  Organism AUC or trough based dosing Target Levels^ SCr (mg/dL) CrCl* (mL/min) Infectious Laboratory Markers (as applicable)   Measured level(s)   (mcg/mL) Calculated AUC (if AUC based monitoring) Plan & predicted AUC/trough if initial dosing (including when levels are due) Comments   3/9 Sarah Initiating Intra-abdominal AUC 400-600 0.69 92.5 WBC: 35.5     - Initiate vancomycin 1g q12h (~13 mg/kg)   - predicted AUC 453  - levels in 48 hrs if continued  - MRSA swab ordered in case of concern for pneumonia    03/11 1154 kop Vancomycin 1gm q12h IAB/bacteremia AUC  0.8 ~92 34.0 Pk 20.9 (ext to 22.5)  Tr 12.0 (ext to 11.95) 402 -AUC in low end of acceptable range  -SCR has increased slightly so will continue same dose for now to avoid accummulation - cefepime increased to q8h per ID rec-growing PSA in blood  -monitor renal closely  +flagyl   3/13 Sarah          - Vancomycin discontinued, switched to daptomycin                   4/20 Sarah Re-initiating Intra-abdominal infection/bacteremia (E. Faecium)  AUC 400-600 0.8 79.8 WBC: 19.0   - Reinitiate vancomycin 750 mg q12h (~10 mg/kg)  - predicted AUC 421  - Levels after 4th/before 5th dose    4/22 CMT 750 mg q 12 h Intra-abdominal infection/bacteremia (E. Faecium) AUC 400-600 0.67 95.3 WBC: 16.2 Pk 20.9 (actual 24.1)  Tr 14.4 (actual 13.4) 441 Continue vancomycin 750 mg q 12h  Predicted AUC 441  Levels have been in range twice - can move to twice-weekly trough monitoring (pt > 65 years and on Q 12 H dosing)    4/25 kmh Vanc 750 mg q12h Intra-abdominal/bacteremia Trough 10-15 0.7 91.2  Trough 17.3  Vanc 500 mg q12h. Draw pk/tr  after 4 doses or sooner if clinically indicated. Predicted peak 20.5 and trough 12.2.                                                                    ^Target levels depends on dosing and monitoring method, AUC vs. trough based. For AUC based dosing units are mg*h/L. For trough based dosing units are mcg/mL.     *Creatinine clearance is estimated by using the Cockcroft-Gault equation for adult patients and the Carol Ada for pediatric patients.    The decision to discontinue vancomycin therapy will be determined by the primary service.  Please contact the pharmacist with any questions regarding this patient's medication regimen.

## 2021-05-25 NOTE — Consults (Signed)
[facility]  Endocrinology Consult  Follow Up Note    [name], [name], 76 y.o. male  Date of Service: 05/25/2021  Date of Birth:  16-Jan-1946    Hospital Day:  LOS: 52 days     Reason for Consult: Diabetes management   Chief Complaint:    Subjective:   Patient seen at bedside today. He is started on diet states he is tolerating po intake with slight improvement but his intake is not that much. Per patient plan for possible discharge to rehab on Thursday and his TPN duration also reduced now to 12 hours daily. Plan for slow transition from TPN to regular diet.      Current Facility-Administered Medications   Medication Dose Route Frequency Provider Last Rate Last Admin    acetaminophen (TYLENOL) tablet  500 mg Oral Q4H PRN Donna Christen, MD   500 mg at 05/23/21 0843    adult custom variable rate parenteral nutrition   Intravenous Continuous Nicola Police, MD   Stopped at 05/25/21 0959    amLODIPine (NORVASC) tablet  5 mg Oral Daily Gilmore Laroche, MD   5 mg at 05/25/21 1025    apixaban (ELIQUIS) tablet  2.5 mg Oral 2x/day Cosner, Adam, PA-C   2.5 mg at 05/25/21 8527    atorvastatin (LIPITOR) tablet  40 mg Oral Daily with Breakfast Nicola Police, MD   40 mg at 05/25/21 7824    benzonatate (TESSALON) capsule  100 mg Oral Q8H PRN [name], MD   100 mg at 05/25/21 0547    D5W 250 mL flush bag   Intravenous Q15 Min PRN Modena Jansky, DO        diatrizoate meglumine & sodium oral solution  8 mL Oral Give in Radiology Orlie Dakin, MD        elvitegravir-cobicistat-emtricitrabine-tenofovir (GENVOYA) 150-150-200-10 mg per tablet  1 Tablet Oral Daily Monia Pouch, DO   1 Tablet at 05/25/21 0543    fluconazole (DIFLUCAN) tablet  400 mg Oral Daily Donna Christen, MD   400 mg at 05/24/21 0815    gabapentin (NEURONTIN) capsule  300 mg Oral 3x/day Mariea Clonts, MD   300 mg at 05/25/21 2353    insulin NPH human 100 units/mL injection  15 Units Subcutaneous Daily before Breakfast  Sadiq, Mehjabeen, MD   15 Units at 05/25/21 0659    insulin NPH human 100 units/mL injection  25 Units Subcutaneous Daily after Luna Glasgow, Karene Fry, MD   25 Units at 05/24/21 2257    insulin R human (HUMULIN R) 100 units/mL injection - for tube feed coverage  10 Units Subcutaneous 3x/day Shafiq, Christeen Douglas, MD        labetalol (TRANDATE) 5 mg/mL injection  10 mg Intravenous Q2H PRN Sowers, Briana, DO   10 mg at 05/23/21 1549    melatonin tablet  6 mg Oral NIGHTLY Croley, Mallory, MD   6 mg at 05/24/21 2300    meropenem (MERREM) 1 g in NS 100 mL IVPB minibag  1 g Intravenous Q8H Donna Christen, MD   Stopped at 05/25/21 0844    mirtazapine (REMERON) tablet  7.5 mg Oral NIGHTLY Donna Christen, MD   7.5 mg at 05/24/21 2300    multivitamin-minerals-folic acid-lycopene-lutein (CERTAVITE SENIOR) tablet  1 Tablet Oral Q24H Cosner, Adam, PA-C   1 Tablet at 05/24/21 1421    NS 250 mL flush bag   Intravenous Q15 Min PRN Modena Jansky, DO 150 mL/hr at 05/20/21 0636 20 mL at  05/20/21 0636    NS flush syringe  10-30 mL Intracatheter Q8HRS Nicola Police, MD   10 mL at 05/25/21 0600    NS flush syringe  20-30 mL Intracatheter Q1 MIN PRN Nicola Police, MD        NS flush syringe  2-6 mL Intracatheter Q8HRS Modena Jansky, DO   5 mL at 05/25/21 0600    NS flush syringe  2-6 mL Intracatheter Q1 MIN PRN Modena Jansky, DO        pantoprazole (PROTONIX) delayed release tablet  40 mg Oral Rolla Flatten, MD   40 mg at 05/25/21 0543    prochlorperazine (COMPAZINE) 5 mg/mL injection  10 mg Intravenous Q6H PRN Mariea Clonts, MD   10 mg at 05/25/21 7741    promethazine (PHENERGAN) tablet  6.25 mg Oral Q6H PRN Lucile Crater, MD   6.25 mg at 05/24/21 0013    scopolamine 1 mg over 3 days transdermal patch  1 Patch Transdermal Q72H Donna Christen, MD   1 Patch at 05/22/21 1340    sennosides-docusate sodium (SENOKOT-S) 8.6-'50mg'$  per tablet  1 Tablet Oral 2x/day Cosner, Adam, PA-C   1 Tablet at 05/24/21  2300    sodium hypochlorite (DAKINS) 0.25% (half strength) irrigation   Irrigation Q12H Donna Christen, MD   Given at 05/24/21 1804    SSIP insulin R human (HUMULIN R) 100 units/mL injection  0-12 Units Subcutaneous Q6H PRN Malena Peer, MD   2 Units at 05/25/21 0659    vancomycin (VANCOCIN) 500 mg in NS 100 mL IVPB  500 mg Intravenous Q12H Nicola Police, MD        Vancomycin IV - Pharmacist to Dose per Protocol   Does not apply Daily PRN Cosner, Adam, PA-C         Objective:  Temperature: 36.8 C (98.2 F)  Heart Rate: (!) 102  BP (Non-Invasive): (!) 143/81  Respiratory Rate: 16  SpO2: 90 %  Physical Exam:  General: Chronically ill appearing male in no acute distress.  Psychiatric: Normal mood and affect  Neuro: Alert and oriented x 3. Speech fluent. Cranial nerves II-XII intact. No focal deficits noted.  HEENT: Head normocephalic, atraumatic. PERRLA, EOMI, conjunctiva clear. Oral mucosa pink and moist.   Thyroid/Neck: Trachea midline  Lungs: Normal respiratory effort.   Cardiac: Regular rate and rhythm  Abdomen: Non-distended  Extremities: No edema noted bilaterally  Skin: No visible rashes.    Filed Vitals:    05/24/21 2311 05/25/21 0340 05/25/21 0700 05/25/21 1100   BP: (!) 145/80 (!) 148/83 (!) 140/92 (!) 143/81   Pulse: 90 100 98 (!) 102   Resp: '16 16 16 16   '$ Temp: 36.5 C (97.7 F) 36.7 C (98.1 F) 36.5 C (97.7 F) 36.8 C (98.2 F)   SpO2: 92% 92% 92% 90%       Lab Results   Component Value Date    WBC 13.3 (H) 05/25/2021    RBC 3.29 (L) 05/25/2021    HGB 8.9 (L) 05/25/2021    HCT 28.5 (L) 05/25/2021    MCV 86.6 05/25/2021    MCH 27.1 05/25/2021    MCHC 31.2 05/25/2021     Lab Results   Component Value Date    SODIUM 136 05/25/2021    POTASSIUM 3.5 05/25/2021    CO2 27 05/25/2021    BUN 26 (H) 05/25/2021    CREATININE 0.70 (L) 05/25/2021    GFR >90 05/25/2021    CALCIUM 9.0  05/25/2021    ALBUMIN 2.0 (L) 05/10/2021    ALKPHOS 80 05/17/2021    ALT 27 05/17/2021    AST 15 05/17/2021    ANIONGAP 6  05/25/2021     Lab Results   Component Value Date    CHOLESTEROL 212 (H) 12/21/2020    HDLCHOL 44 12/21/2020    LDLCHOL 96 12/21/2020    LDLCHOLDIR 89 03/24/2020    TRIG 132 05/17/2021      Lab Results   Component Value Date    HA1C 9.3 (H) 03/17/2021         Recent Labs     05/23/21  1807 05/23/21  2126 05/24/21  0055 05/24/21  0607 05/24/21  1249 05/24/21  1737 05/24/21  2252 05/25/21  0016 05/25/21  0637 05/25/21  0854   GLUIP 114* 152* 168* 170* 86 155* 116* 195* 196* 232*       Assessment/Recommendations:  [name] is a 76 y.o. 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 Endocrinology is consulted for management of diabetes.     Post Pancreatectomy Diabetes Mellitus-improved control   - Most recent HbA1c on 03/17/2021 was 9.3%.  - Home regimen: Lantus 11 units nightly, Novolog 4 units TID AC.   - Current inpatient regimen:   Insulin NPH 15 units q.a.m., 25 units q.p.m., Humulin R 8 units q8, scheduled with TPN  and Humulin R conservative  sliding scale 4x/day PRN.   -MNT PROTOCOL FOR DIETITIAN  DIETARY ORAL SUPPLEMENTS Oral Supplements with tray: Ensure High Protein-Vanilla; BREAKFAST/LUNCH/DINNER; 1 Each  ROOM SERVICE:  NEEDS VISIT FOR MENU CHOICES  adult custom variable rate parenteral nutrition  DIET DIABETIC Calorie amount: CC 2200    Fasting sugars in 190's today per family he was able to have his dinner last night and had some breakfast today. He remains on TPN  For poor caloric intake plan for reducing duration to 12 hours daily.    Recommendations:  Will recommend to increase prandial insulin coverage as plan to continue TPN for now with diet as tolerated.  Will increase Regular insulin to 10 units TID scheduled with TPN  C/w conservative SSI Q6 hr  PRN  C/w NPH insulin 15 units in and 25 units in pm    Thank you for the consult. Endocrine will follow.    Marylu Lund, MD  Fellow, Endocrinology, Diabetes and Metabolism  05/25/2021  12:27    I saw and examined the patient.  I reviewed the fellow's note.  I agree with the findings and plan of care as documented in the fellow's note.  Any exceptions/additions are edited/noted.    Silvestre Gunner, MD  Assistant Professor  Endocrinology & Metabolism  [facility]

## 2021-05-25 NOTE — Ancillary Notes (Signed)
[facility]  MRI Technologist Note        MRI has been completed.        Evern Core, RTR 05/25/2021, 04:52

## 2021-05-25 NOTE — Care Plan (Signed)
Medical Nutrition Therapy Calorie Count Note  I: Estimated Needs:  Energy Calorie Requirements: 2481-8590 cal (28-30 cal/75.9 kg)  Protein Requirements (gms/day): 88-110 g prot (1.2-1.5 g/73.7 kg)  Fluid Requirements: 2100-2300 ml (28-30 ml/75.9 k)    Calorie Count day1:573calories, 37grams of protein, which met 25-27% of estimated calorie needs and 34-42% of estimated protein needs. Pt consumed 2 ensures providing 320 calories.    Calorie Count day2: Pt was NPO.    Calorie Count day3:350calories, 11grams of protein, which met 15-17% of estimated calorie needs and 1-13% of estimated protein needs. Pt had no diner documented.    Calorie Count day4:322calories, 24grams of protein, which met 14-15% of estimated calorie needs and 22-27% of estimated protein needs. Pt only had breakfast documented.    Calorie Count day5:340calories, 24grams of protein, which met 15-16% of estimated calorie needs and 22-27% of estimated protein needs. Pt only had breakfast documented. Pt reports he did not have any lunch or dinner. Lunch and dinner was not documented on CC sheet.     CC to run through today (4/25) and through the end of tomorrow (4/26).     Please document % intake of all food, drinks and dietary supplements to help determine appropriate nutrition intervention.    Tracey Harries, NDTR  05/25/2021, 08:49  Pager#[phone]

## 2021-05-26 LAB — BASIC METABOLIC PANEL
ANION GAP: 9 mmol/L (ref 4–13)
BUN/CREA RATIO: 35 — ABNORMAL HIGH (ref 6–22)
BUN: 27 mg/dL — ABNORMAL HIGH (ref 8–25)
CALCIUM: 9.1 mg/dL (ref 8.8–10.2)
CHLORIDE: 105 mmol/L (ref 96–111)
CO2 TOTAL: 25 mmol/L (ref 23–31)
CREATININE: 0.78 mg/dL (ref 0.75–1.35)
ESTIMATED GFR: >90 mL/min/BSA (ref >=60)
GLUCOSE: 176 mg/dL — ABNORMAL HIGH (ref 65–125)
POTASSIUM: 3.6 mmol/L (ref 3.5–5.1)
SODIUM: 139 mmol/L (ref 136–145)

## 2021-05-26 LAB — TRIGLYCERIDE: TRIGLYCERIDES: 300 mg/dL — ABNORMAL HIGH (ref <150)

## 2021-05-26 LAB — CBC
HCT: 29.9 % — ABNORMAL LOW (ref 38.9–52.0)
HGB: 9.3 g/dL — ABNORMAL LOW (ref 13.4–17.5)
MCH: 27.1 pg (ref 26.0–32.0)
MCHC: 31.1 g/dL (ref 31.0–35.5)
MCV: 87.2 fL (ref 78.0–100.0)
PLATELETS: 356 10*3/uL (ref 150–400)
RBC: 3.43 10*6/uL — ABNORMAL LOW (ref 4.50–6.10)
RDW-CV: 17.4 % — ABNORMAL HIGH (ref 11.5–15.5)
WBC: 14.3 10*3/uL — ABNORMAL HIGH (ref 3.7–11.0)

## 2021-05-26 LAB — POC BLOOD GLUCOSE (RESULTS)
GLUCOSE, POC: 212 mg/dl — ABNORMAL HIGH (ref 70–105)
GLUCOSE, POC: 252 mg/dl — ABNORMAL HIGH (ref 70–105)
GLUCOSE, POC: 77 mg/dl (ref 70–105)
GLUCOSE, POC: 92 mg/dl (ref 70–105)

## 2021-05-26 LAB — MAGNESIUM: MAGNESIUM: 2.2 mg/dL (ref 1.8–2.6)

## 2021-05-26 LAB — PHOSPHORUS: PHOSPHORUS: 2.9 mg/dL (ref 2.3–4.0)

## 2021-05-26 MED ORDER — INSULIN NPH ISOPHANE U-100 HUMAN 100 UNIT/ML SUBCUTANEOUS SUSPENSION
27.0000 [IU] | Freq: Every evening | SUBCUTANEOUS | Status: DC
  Administered 2021-05-26: 27 [IU] via SUBCUTANEOUS

## 2021-05-26 MED ORDER — SODIUM CHLORIDE 4 MEQ/ML INTRAVENOUS SOLUTION
INTRAVENOUS | Status: AC
Start: 2021-05-26 — End: 2021-05-27
  Filled 2021-05-26: qty 1000

## 2021-05-26 MED ORDER — AMLODIPINE 10 MG TABLET
10.0000 mg | ORAL_TABLET | Freq: Every day | ORAL | Status: DC
Start: 2021-05-26 — End: 2021-05-28
  Administered 2021-05-26 – 2021-05-28 (×3): 10 mg via ORAL
  Filled 2021-05-26 (×3): qty 1

## 2021-05-26 MED ORDER — INSULIN REGULAR HUMAN 100 UNIT/ML INJECTION - FOR TUBE FEED
12.0000 [IU] | Freq: Three times a day (TID) | INTRAMUSCULAR | Status: DC
Start: 2021-05-26 — End: 2021-05-28
  Administered 2021-05-26: 0 [IU] via SUBCUTANEOUS
  Administered 2021-05-27 – 2021-05-28 (×4): 12 [IU] via SUBCUTANEOUS

## 2021-05-26 MED ORDER — INSULIN REGULAR HUMAN 100 UNIT/ML INJECTION - FOR TUBE FEED
14.0000 [IU] | Freq: Three times a day (TID) | INTRAMUSCULAR | Status: DC
Start: 2021-05-26 — End: 2021-05-26

## 2021-05-26 NOTE — Consults (Signed)
Brief Endocrinology Consult Note:     Recent Labs     05/24/21  1737 05/24/21  2252 05/25/21  0016 05/25/21  9379 05/25/21  0854 05/25/21  1559 05/25/21  1731 05/25/21  2033 05/25/21  2339 05/26/21  0635 05/26/21  0900 05/26/21  1154   GLUIP 155* 116* 195* 196* 232* 113* 96 170* 144* 212* 252* 77       Patient not seen today. Chart reviewed.  Fasting sugars noted to be elevated today in 250's with PP yesterday in 170's  He is on TPN with as tolerated diabetic diet and received 16 units regular insulin in morning today with sugars dropping to 77  Recommendations:  Will increase NPH insulin to 27 units at night and c/w 15 units in  Morning  Will increase humulin R with TPN to 12 units TID for now  C/w conservative SSI of humulin R for now       Marylu Lund, MD, 05/26/2021, 13:12  Endocrinology, Diabetes & Metabolism Fellow  [facility]      I reviewed the fellow's note.  I agree with the findings and plan of care as documented in the fellow's note.  Any exceptions/additions are edited/noted.    Silvestre Gunner, MD  Assistant Professor  Endocrinology & Metabolism  [facility]

## 2021-05-26 NOTE — Care Plan (Addendum)
Medical Nutrition Therapy Calorie Count Note  I: Estimated Needs:  Energy Calorie Requirements: 6712-4580 cal (28-30 cal/75.9 kg)  Protein Requirements (gms/day): 88-110 g prot (1.2-1.5 g/73.7 kg)  Fluid Requirements: 2100-2300 ml (28-30 ml/75.9 k)    Calorie Count day1:573calories, 37grams of protein, which met 25-27% of estimated calorie needs and 34-42% of estimated protein needs. Pt consumed 2 ensures providing 320 calories.    Calorie Count day2:Pt was NPO.    Calorie Count day3:350calories,11grams of protein, which met15-17% of estimated calorie needs and1-13% of estimated protein needs. Pthad no diner documented.    Calorie Count day4:322calories,24grams of protein, which met14-15% of estimated calorie needs and22-27% of estimated protein needs. Ptonly had breakfast documented.    Calorie Count day5:340calories,24grams of protein, which met15-16% of estimated calorie needs and22-27% of estimated protein needs. Ptonly had breakfast documented. Pt reports he did not have any lunch or dinner. Lunch and dinner was not documented on CC sheet.     Calorie Count day6:795calories,49grams of protein, which met35-38% of estimated calorie needs and45-56% of estimated protein needs. No documentation on CC sheet. Calories based on pt's statement. Pt consumed 2 ensures providing 320 calories.    CC to continue per service.    Please document % intake of all food, drinks and dietary supplements to help determine appropriate nutrition intervention.    Tracey Harries, [place]  05/26/2021, 09:06  Pager#[phone]

## 2021-05-26 NOTE — Progress Notes (Signed)
[facility]  Surgical Oncology  Progress Note      [name], [name], 76 y.o. male  Date of Birth:  [birth year]  Date of Admission:  04/03/2021  Date of service: 05/26/2021    Assessment:  This is a 76 y.o. 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 He underwent massive transfusion protocol and ex lap on 04/03/21 at an outside facility with ligation of a bleeding vessel in the liver bed. Patient was subsequently transferred to [facility] postoperaively, intubated. Patient was subsequently extubated later that day on 3/4. 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 He failed advancing his diet to fulls the week of 4/08, backed off to TPN / clears after he developed severe nausea. Pt is now s/p repeat cystogastrostomy with GI on 4/20.     Plan/Recommendations:  -Diabetic diet as tolerated  -Continue calorie count  -Renew TPN today   -Pt underwent repeat cystogastrostomy and necrosectomy 4/20  -Midline wound (superior portion)   -Nursing to perform BID packing changes with kerlix and dakins 0.25% - ordered  -Eliquis 2.5 BID (resumed 4/25)  - Positive blood culture    - One of two bottles positive for GPCs   - Repeat blood cultures 05/17/21 NGTD   - Continue antibiotics at this time, deescalate when able (currently on vancomycin and merrem)   - Appreciate ID recs  - Nausea control prn  - Peripancreatic fluid collections   - Advanced GI performed EUS with cystogastrostomy 4/6 and repeat 4/20              - Blood cultures 3/9: + pseudomonas    - Cultures form R JP: Pseudomonas and enterococcus              - ID consulted; recommend vancomycin and meropenum until 5/11  - PE   - s/p IVC filter placement 3/17  -  Fine tremors:    - MRI brain and spine completed   - CTA intra/extra completed - mild atherosclerotic disease of ICAs   - Appreciate Neuro recs  - Midline fistula with ostomy bag to monitor output  - Appreciate endocrine recs regarding glucose control  - OOB TID, encourage ambulation  - Pulmonary toilet, keep IS accessible at bedside  - PT/OT ordered, encourage pt to work with therapies  - Dispo: Floor status. Appreciate care management continued evaluation and assistance.      Subjective:   Resting comfortably in bed this am. He states that he had interrupted sleep overnight but denies n/v. Tolerating food and states that he ate 1/2 a bowl of macaroni and cheese for dinner last night.    Objective  Filed Vitals:    05/25/21 1500 05/25/21 1938 05/25/21 2337 05/26/21 0341   BP: 124/74 124/77 134/80 (!) 154/85   Pulse: (!) 102 98 98 (!) 104   Resp: '16 16 17 17   '$ Temp: 36.5 C (97.7 F) 37 C (98.6 F) 37 C (98.6 F) 36.8 C (98.2 F)   SpO2: 91% 93% 91% 93%     Physical Exam:   GEN:  AOx4, resting in bed, appears comfortable  PULM: Normal respiratory effort. Equal/symmetric chest rise.  CV:  Pink, well perfused  ABD:  Abdomen soft, nontender, nondistended.   MS: Atraumatic. Moves all extremities.  NEURO:   Alert and oriented to person, place and time. Cranial nerves grossly intact. Responds to all questions appropriately. Fine tremors of hands/arms noted b/l UE  Integumentary:  Pink, warm, and dry  PSYCHOSOCIAL: Anxious but cooperative. Normal affect.     Labs  Results for orders placed or performed during the hospital encounter of 04/03/21 (from the past 24 hour(s))   BASIC METABOLIC PANEL   Result Value Ref Range    SODIUM 139 136 - 145 mmol/L    POTASSIUM 3.6 3.5 - 5.1 mmol/L    CHLORIDE 105 96 - 111 mmol/L    CO2 TOTAL 25 23 - 31 mmol/L    ANION GAP 9 4 - 13 mmol/L    CALCIUM 9.1 8.8 - 10.2 mg/dL    GLUCOSE 176 (H) 65 - 125 mg/dL    BUN 27 (H) 8 - 25 mg/dL    CREATININE 0.78 0.75 - 1.35 mg/dL    BUN/CREA RATIO  35 (H) 6 - 22    ESTIMATED GFR >90 >=60 mL/min/BSA    Narrative    Sample ultracentrifuged prior to analysis to remove potential lipemia interference.     CBC   Result Value Ref Range    WBC 14.3 (H) 3.7 - 11.0 x10^3/uL    RBC 3.43 (L) 4.50 - 6.10 x10^6/uL    HGB 9.3 (L) 13.4 - 17.5 g/dL    HCT 29.9 (L) 38.9 - 52.0 %    MCV 87.2 78.0 - 100.0 fL    MCH 27.1 26.0 - 32.0 pg    MCHC 31.1 31.0 - 35.5 g/dL    RDW-CV 17.4 (H) 11.5 - 15.5 %    PLATELETS 356 150 - 400 x10^3/uL    Narrative    Mean Platelet Volume - Not Reported  Platelet Count -Results reviewed.   MAGNESIUM   Result Value Ref Range    MAGNESIUM 2.2 1.8 - 2.6 mg/dL    Narrative    Sample ultracentrifuged prior to analysis to remove potential lipemia interference.     PHOSPHORUS   Result Value Ref Range    PHOSPHORUS 2.9 2.3 - 4.0 mg/dL    Narrative    Sample ultracentrifuged prior to analysis to remove potential lipemia interference.     POC BLOOD GLUCOSE (RESULTS)   Result Value Ref Range    GLUCOSE, POC 232 (H) 70 - 105 mg/dl   POC BLOOD GLUCOSE (RESULTS)   Result Value Ref Range    GLUCOSE, POC 113 (H) 70 - 105 mg/dl   POC BLOOD GLUCOSE (RESULTS)   Result Value Ref Range    GLUCOSE, POC 96 70 - 105 mg/dl   POC BLOOD GLUCOSE (RESULTS)   Result Value Ref Range    GLUCOSE, POC 170 (H) 70 - 105 mg/dl   POC BLOOD GLUCOSE (RESULTS)   Result Value Ref Range    GLUCOSE, POC 144 (H) 70 - 105 mg/dl   POC BLOOD GLUCOSE (RESULTS)   Result Value Ref Range    GLUCOSE, POC 212 (H) 70 - 105 mg/dl       I/O:  Date 05/25/21 0700 - 05/26/21 0659 05/26/21 0700 - 05/27/21 0659   Shift 8676-7209 1500-2259 2300-0659 24 Hour Total 0700-1459 1500-2259 2300-0659 24 Hour Total   INTAKE   P.O. 237   237         Oral 237   237       I.V.(mL/kg/hr) 1870(3.24)  1870(1.08)         Volume Infused (adult custom variable rate parenteral nutrition) 1870   1870       IV Piggyback 358 206 206.8 770.8         Volume (meropenem (MERREM) 1 g in NS 100 mL IVPB minibag) 100 100 206.8 406.8          Volume (vancomycin (VANCOCIN) 750 mg in NS 250 mL IVPB) 258   258         Volume (vancomycin (VANCOCIN) 500 mg in NS 100 mL IVPB)  106  106       Shift Total(mL/kg) 1497(02.63) 206(2.86) 785.8(8.50) 2774.1(28.78)       OUTPUT   Urine(mL/kg/hr) 6767(2.09) 1250(2.17) 4709(6.28) 3740(2.16)         Urine (Voided) 1125 1250 1365 3740         Urine Occurrence 2 x 2 x  4 x       Drains  0 0 0         Drain Output (Drain (Miscellaneous) peds ostomy bag Lower;Medial Abdomen)  0 0 0       Stool             Stool Occurrence 1 x   1 x       Shift Total(mL/kg) 1125(15.6) 3662(94.76) 5465(03.54) 6568(12.75)       Weight (kg) 72.1 72.1 72.1 72.1 72.1 72.1 72.1 72.1       Radiology:  05/24/21 CT C/A/P   1.Postsurgical changes from distal pancreatectomy and splenectomy with complex fluid and multiple upper abdominal organized collections, further detailed above. Left upper quadrant collection has intervally decreased in size. Other collections are similar to minimally decreased in size. No new intra-abdominal collection is identified.  2.Midline abdominal wall incision with underlying fluid and air is also smaller in size when compared to prior examination.  3.Portion of the transverse colon abutting the ventral abdominal wall without frank communication identified.  4.Trace left pleural effusion with bibasilar atelectasis and bibasilar atelectasis.    05/24/21 MRI brain/spine: Read pending    Current Medications:  Current Facility-Administered Medications   Medication Dose Route Frequency   . acetaminophen (TYLENOL) tablet  500 mg Oral Q4H PRN   . adult custom variable rate parenteral nutrition   Intravenous Continuous   . amLODIPine (NORVASC) tablet  10 mg Oral Daily   . apixaban (ELIQUIS) tablet  2.5 mg Oral 2x/day   . atorvastatin (LIPITOR) tablet  40 mg Oral Daily with Breakfast   . benzonatate (TESSALON) capsule  100 mg Oral Q8H PRN   . D5W 250 mL flush bag   Intravenous Q15 Min PRN   . diatrizoate meglumine & sodium oral  solution  8 mL Oral Give in Radiology   . elvitegravir-cobicistat-emtricitrabine-tenofovir (GENVOYA) 150-150-200-10 mg per tablet  1 Tablet Oral Daily   . fluconazole (DIFLUCAN) tablet  400 mg Oral Daily   . gabapentin (NEURONTIN) capsule  300 mg Oral 3x/day   . insulin NPH human 100 units/mL injection  15 Units Subcutaneous Daily before Breakfast   . insulin NPH human 100 units/mL injection  25 Units Subcutaneous Daily after Dinner   . insulin R human (HUMULIN R) 100 units/mL injection - for tube feed coverage  10 Units Subcutaneous 3x/day   . labetalol (TRANDATE) 5 mg/mL injection  10 mg Intravenous Q2H PRN   . levothyroxine (SYNTHROID) tablet  88 mcg Oral QAM   . melatonin tablet  6 mg Oral NIGHTLY   .  meropenem (MERREM) 1 g in NS 100 mL IVPB minibag  1 g Intravenous Q8H   . mirtazapine (REMERON) tablet  7.5 mg Oral NIGHTLY   . multivitamin-minerals-folic acid-lycopene-lutein (CERTAVITE SENIOR) tablet  1 Tablet Oral Q24H   . NS 250 mL flush bag   Intravenous Q15 Min PRN   . NS flush syringe  10-30 mL Intracatheter Q8HRS   . NS flush syringe  20-30 mL Intracatheter Q1 MIN PRN   . NS flush syringe  2-6 mL Intracatheter Q8HRS   . NS flush syringe  2-6 mL Intracatheter Q1 MIN PRN   . pantoprazole (PROTONIX) delayed release tablet  40 mg Oral Q12H   . prochlorperazine (COMPAZINE) 5 mg/mL injection  10 mg Intravenous Q6H PRN   . promethazine (PHENERGAN) tablet  6.25 mg Oral Q6H PRN   . scopolamine 1 mg over 3 days transdermal patch  1 Patch Transdermal Q72H   . sennosides-docusate sodium (SENOKOT-S) 8.6-'50mg'$  per tablet  1 Tablet Oral 2x/day   . sodium hypochlorite (DAKINS) 0.25% (half strength) irrigation   Irrigation Q12H   . SSIP insulin R human (HUMULIN R) 100 units/mL injection  0-12 Units Subcutaneous Q6H PRN   . vancomycin (VANCOCIN) 500 mg in NS 100 mL IVPB  500 mg Intravenous Q12H   . Vancomycin IV - Pharmacist to Dose per Protocol   Does not apply Daily PRN       Donna Christen, MD  General Surgery //  PGY-1  [place]

## 2021-05-27 DIAGNOSIS — E891 Postprocedural hypoinsulinemia: Secondary | ICD-10-CM

## 2021-05-27 DIAGNOSIS — Z794 Long term (current) use of insulin: Secondary | ICD-10-CM

## 2021-05-27 DIAGNOSIS — E1365 Other specified diabetes mellitus with hyperglycemia: Secondary | ICD-10-CM

## 2021-05-27 LAB — PHOSPHORUS: PHOSPHORUS: 3.7 mg/dL (ref 2.3–4.0)

## 2021-05-27 LAB — CBC
HCT: 27.7 % — ABNORMAL LOW (ref 38.9–52.0)
HGB: 8.5 g/dL — ABNORMAL LOW (ref 13.4–17.5)
MCH: 26.7 pg (ref 26.0–32.0)
MCHC: 30.7 g/dL — ABNORMAL LOW (ref 31.0–35.5)
MCV: 87.1 fL (ref 78.0–100.0)
MPV: 12.6 fL — ABNORMAL HIGH (ref 8.7–12.5)
PLATELETS: 330 10*3/uL (ref 150–400)
RBC: 3.18 10*6/uL — ABNORMAL LOW (ref 4.50–6.10)
RDW-CV: 17.2 % — ABNORMAL HIGH (ref 11.5–15.5)
WBC: 12.5 10*3/uL — ABNORMAL HIGH (ref 3.7–11.0)

## 2021-05-27 LAB — BASIC METABOLIC PANEL
ANION GAP: 6 mmol/L (ref 4–13)
BUN/CREA RATIO: 39 — ABNORMAL HIGH (ref 6–22)
BUN: 29 mg/dL — ABNORMAL HIGH (ref 8–25)
CALCIUM: 9.9 mg/dL (ref 8.8–10.2)
CHLORIDE: 106 mmol/L (ref 96–111)
CO2 TOTAL: 27 mmol/L (ref 23–31)
CREATININE: 0.74 mg/dL — ABNORMAL LOW (ref 0.75–1.35)
ESTIMATED GFR: >90 mL/min/BSA (ref >=60)
GLUCOSE: 75 mg/dL (ref 65–125)
POTASSIUM: 4 mmol/L (ref 3.5–5.1)
SODIUM: 139 mmol/L (ref 136–145)

## 2021-05-27 LAB — VANCOMYCIN, TROUGH
VANCOMYCIN TROUGH: 16.3 ug/mL (ref 10.0–20.0)
VANCOMYCIN TROUGH: 13.8 ug/mL (ref 10.0–20.0)

## 2021-05-27 LAB — MAGNESIUM: MAGNESIUM: 2.3 mg/dL (ref 1.8–2.6)

## 2021-05-27 LAB — POC BLOOD GLUCOSE (RESULTS)
GLUCOSE, POC: 187 mg/dl — ABNORMAL HIGH (ref 70–105)
GLUCOSE, POC: 278 mg/dl — ABNORMAL HIGH (ref 70–105)
GLUCOSE, POC: 208 mg/dl — ABNORMAL HIGH (ref 70–105)
GLUCOSE, POC: 150 mg/dl — ABNORMAL HIGH (ref 70–105)

## 2021-05-27 LAB — C-REACTIVE PROTEIN(CRP),INFLAMMATION: CRP INFLAMMATION: 36.6 mg/L — ABNORMAL HIGH (ref <8.0)

## 2021-05-27 LAB — TRIGLYCERIDE: TRIGLYCERIDES: 212 mg/dL — ABNORMAL HIGH (ref <150)

## 2021-05-27 LAB — VANCOMYCIN PEAK: VANCOMYCIN PEAK: 14.4 ug/mL — ABNORMAL LOW (ref 30.0–40.0)

## 2021-05-27 MED ORDER — INSULIN NPH ISOPHANE U-100 HUMAN 100 UNIT/ML SUBCUTANEOUS SUSPENSION
15.0000 [IU] | Freq: Every morning | SUBCUTANEOUS | Status: DC
Start: 2021-05-28 — End: 2021-06-27

## 2021-05-27 MED ORDER — PROCHLORPERAZINE EDISYLATE 10 MG/2 ML (5 MG/ML) INJECTION SOLUTION
10.0000 mg | Freq: Four times a day (QID) | INTRAMUSCULAR | Status: DC | PRN
Start: 2021-05-27 — End: 2021-07-14

## 2021-05-27 MED ORDER — APIXABAN 2.5 MG TABLET
2.5000 mg | ORAL_TABLET | Freq: Two times a day (BID) | ORAL | 0 refills | Status: DC
Start: 2021-05-27 — End: 2021-08-09

## 2021-05-27 MED ORDER — FLUCONAZOLE 200 MG TABLET
400.0000 mg | ORAL_TABLET | Freq: Every day | ORAL | 0 refills | Status: DC
Start: 2021-05-28 — End: 2021-06-23

## 2021-05-27 MED ORDER — VANCOMYCIN 10 GRAM INTRAVENOUS SOLUTION
500.0000 mg | Freq: Two times a day (BID) | INTRAVENOUS | Status: DC
Start: 2021-05-27 — End: 2021-05-28

## 2021-05-27 MED ORDER — SENNOSIDES 8.6 MG-DOCUSATE SODIUM 50 MG TABLET
1.0000 | ORAL_TABLET | Freq: Two times a day (BID) | ORAL | Status: DC
Start: 2021-05-27 — End: 2023-02-09

## 2021-05-27 MED ORDER — INSULIN LISPRO 100 UNIT/ML SUB-Q SSIP
0.0000 [IU] | INJECTION | Freq: Four times a day (QID) | SUBCUTANEOUS | Status: DC | PRN
Start: 2021-05-27 — End: 2021-05-28
  Administered 2021-05-27: 0 [IU] via SUBCUTANEOUS

## 2021-05-27 MED ORDER — PARENTERAL AMINO ACID 10 % COMBINATION NO.6 INTRAVENOUS SOLUTION
INTRAVENOUS | Status: DC
Start: 2021-05-27 — End: 2021-05-28
  Filled 2021-05-27: qty 1000

## 2021-05-27 MED ORDER — SODIUM CHLORIDE 0.9 % INTRAVENOUS PIGGYBACK
1.0000 g | Freq: Three times a day (TID) | INTRAVENOUS | Status: DC
Start: 2021-05-27 — End: 2021-06-23

## 2021-05-27 MED ORDER — VANCOMYCIN 10 GRAM INTRAVENOUS SOLUTION
17.0000 mg/kg | INTRAVENOUS | Status: DC
Start: 2021-05-28 — End: 2021-05-28
  Administered 2021-05-28: 1250 mg via INTRAVENOUS
  Administered 2021-05-28: 0 mg via INTRAVENOUS
  Filled 2021-05-27: qty 12.5

## 2021-05-27 MED ORDER — PROMETHAZINE 12.5 MG TABLET
6.2500 mg | ORAL_TABLET | Freq: Four times a day (QID) | ORAL | Status: DC | PRN
Start: 2021-05-27 — End: 2021-06-23

## 2021-05-27 MED ORDER — INSULIN REGULAR HUMAN 100 UNIT/ML INJECTION - FOR TUBE FEED
12.0000 [IU] | Freq: Three times a day (TID) | INTRAMUSCULAR | Status: DC
Start: 2021-05-27 — End: 2021-06-27

## 2021-05-27 MED ORDER — INSULIN LISPRO (U-100) 100 UNIT/ML SUBCUTANEOUS SOLUTION
3.0000 [IU] | Freq: Three times a day (TID) | SUBCUTANEOUS | Status: DC
Start: 2021-05-27 — End: 2021-05-27

## 2021-05-27 MED ORDER — INSULIN LISPRO 100 UNIT/ML SUB-Q SSIP
0.0000 [IU] | INJECTION | Freq: Four times a day (QID) | SUBCUTANEOUS | Status: DC | PRN
Start: 2021-05-27 — End: 2021-09-02

## 2021-05-27 MED ORDER — WATER FOR INJECTION, STERILE INTRAVENOUS SOLUTION
INTRAVENOUS | Status: DC
Start: 2021-05-27 — End: 2021-05-27
  Filled 2021-05-27: qty 1000

## 2021-05-27 MED ORDER — INSULIN NPH ISOPHANE U-100 HUMAN 100 UNIT/ML SUBCUTANEOUS SUSPENSION
25.0000 [IU] | Freq: Every evening | SUBCUTANEOUS | Status: DC
  Administered 2021-05-27: 25 [IU] via SUBCUTANEOUS

## 2021-05-27 MED ORDER — INSULIN NPH ISOPHANE U-100 HUMAN 100 UNIT/ML SUBCUTANEOUS SUSPENSION
25.0000 [IU] | Freq: Every evening | SUBCUTANEOUS | Status: DC
Start: 2021-05-27 — End: 2021-06-23

## 2021-05-27 MED ORDER — SODIUM HYPOCHLORITE 0.25 % SOLUTION
2.0000 | Freq: Two times a day (BID) | Status: DC
Start: 2021-05-27 — End: 2021-05-27

## 2021-05-27 NOTE — Consults (Cosign Needed)
[facility]  Endocrinology Consult  Follow Up Note    [name], [name], 76 y.o. male  Date of Service: 05/27/2021  Date of Birth:  03-21-1945    Hospital Day:  LOS: 54 days     Reason for Consult: Diabetes management   Chief Complaint:    Subjective:   Patient seen at bedside today, endorses feeling nauseous and didn't have breakfast today. He dint eat anything for dinner as well  He is drinking ensure and apple juice for now  TPN is running for 12 hours in a day per family it was stopped around noon.  Plan for possible DC to rehab tomorrow    Current Facility-Administered Medications   Medication Dose Route Frequency Provider Last Rate Last Admin   . acetaminophen (TYLENOL) tablet  500 mg Oral Q4H PRN Donna Christen, MD   500 mg at 05/26/21 2111   . adult custom variable rate parenteral nutrition   Intravenous Continuous Cosner, Adam, PA-C   Stopped at 05/27/21 1113   . adult custom variable rate parenteral nutrition   Intravenous Continuous Mariea Clonts, MD       . amLODIPine [facility]) tablet  10 mg Oral Daily Lucile Crater, MD   10 mg at 05/27/21 0826   . apixaban (ELIQUIS) tablet  2.5 mg Oral 2x/day Cosner, Adam, PA-C   2.5 mg at 05/27/21 0826   . atorvastatin (LIPITOR) tablet  40 mg Oral Daily with Breakfast Nicola Police, MD   40 mg at 05/27/21 0826   . benzonatate (TESSALON) capsule  100 mg Oral Q8H PRN [name], MD   100 mg at 05/27/21 0529   . D5W 250 mL flush bag   Intravenous Q15 Min PRN Modena Jansky, DO       . diatrizoate meglumine & sodium oral solution  8 mL Oral Give in Radiology Orlie Dakin, MD       . elvitegravir-cobicistat-emtricitrabine-tenofovir (GENVOYA) 150-150-200-10 mg per tablet  1 Tablet Oral Daily Monia Pouch, DO   1 Tablet at 05/27/21 0529   . fluconazole (DIFLUCAN) tablet  400 mg Oral Daily Donna Christen, MD   400 mg at 05/27/21 0826   . gabapentin (NEURONTIN) capsule  300 mg Oral 3x/day Mariea Clonts, MD   300 mg at 05/27/21 0800    . insulin NPH human 100 units/mL injection  15 Units Subcutaneous Daily before Breakfast Malena Peer, MD   15 Units at 05/27/21 0826   . insulin NPH human 100 units/mL injection  25 Units Subcutaneous Daily after Debbe Odea, Valeri Sula, MD       . insulin R human (HUMULIN R) 100 units/mL injection - for tube feed coverage  12 Units Subcutaneous 3x/day Marylu Lund, MD   12 Units at 05/27/21 0834   . labetalol (TRANDATE) 5 mg/mL injection  10 mg Intravenous Q2H PRN Stacey Drain, DO   10 mg at 05/27/21 6237   . levothyroxine (SYNTHROID) tablet  88 mcg Oral QAM Cosner, Adam, PA-C   88 mcg at 05/27/21 0529   . melatonin tablet  6 mg Oral NIGHTLY Donna Christen, MD   6 mg at 05/26/21 2111   . meropenem (MERREM) 1 g in NS 100 mL IVPB minibag  1 g Intravenous Q8H Donna Christen, MD   Stopped at 05/27/21 0724   . mirtazapine (REMERON) tablet  7.5 mg Oral NIGHTLY Donna Christen, MD   7.5 mg at 05/26/21 2111   . multivitamin-minerals-folic acid-lycopene-lutein (CERTAVITE SENIOR) tablet  1 Tablet  Oral Q24H Cosner, Adam, PA-C   1 Tablet at 05/26/21 1436   . NS 250 mL flush bag   Intravenous Q15 Min PRN Modena Jansky, DO 150 mL/hr at 05/20/21 0636 20 mL at 05/20/21 0636   . NS flush syringe  10-30 mL Intracatheter Q8HRS Nicola Police, MD   10 mL at 05/25/21 2052   . NS flush syringe  20-30 mL Intracatheter Q1 MIN PRN Nicola Police, MD       . NS flush syringe  2-6 mL Intracatheter Q8HRS Modena Jansky, DO   2 mL at 05/25/21 2051   . NS flush syringe  2-6 mL Intracatheter Q1 MIN PRN Modena Jansky, DO       . pantoprazole (PROTONIX) delayed release tablet  40 mg Oral Rolla Flatten, MD   40 mg at 05/27/21 0529   . prochlorperazine (COMPAZINE) 5 mg/mL injection  10 mg Intravenous Q6H PRN Mariea Clonts, MD   10 mg at 05/27/21 0843   . promethazine (PHENERGAN) tablet  6.25 mg Oral Q6H PRN Lucile Crater, MD   6.25 mg at 05/24/21 0013   . scopolamine 1 mg over 3 days transdermal patch   1 Patch Transdermal Q72H Donna Christen, MD   1 Patch at 05/25/21 1423   . sennosides-docusate sodium (SENOKOT-S) 8.6-'50mg'$  per tablet  1 Tablet Oral 2x/day Cosner, Adam, PA-C   1 Tablet at 05/27/21 [phone]   . sodium hypochlorite (DAKINS) 0.25% (half strength) irrigation   Irrigation Q12H Donna Christen, MD   Given at 05/27/21 1057   . SSIP insulin R human (HUMULIN R) 100 units/mL injection  0-12 Units Subcutaneous Q6H PRN Malena Peer, MD   4 Units at 05/27/21 0830   . vancomycin (VANCOCIN) 500 mg in NS 100 mL IVPB  500 mg Intravenous Q12H Nicola Police, MD   Stopped at 05/27/21 [phone]   . Vancomycin IV - Pharmacist to Dose per Protocol   Does not apply Daily PRN Cosner, Adam, PA-C         Objective:  Temperature: 37.2 C (99 F)  Heart Rate: (!) 112  BP (Non-Invasive): (!) 142/80  Respiratory Rate: 18  SpO2: 93 %  Physical Exam:  General: Well appearing male in no acute distress.  Psychiatric: Normal mood and affect  Neuro: Alert and oriented x 3. Speech fluent. Cranial nerves II-XII intact. No focal deficits noted.  HEENT: Head normocephalic, atraumatic. PERRLA, EOMI, conjunctiva clear. Oral mucosa pink and moist.   Thyroid/Neck: Trachea midline  Lungs: Normal respiratory effort.   Cardiac: Regular rate and rhythm  Abdomen: Non-distended  Extremities: No edema noted bilaterally  Skin: No visible rashes.    Filed Vitals:    05/27/21 0808 05/27/21 0905 05/27/21 1008 05/27/21 1139   BP: (!) 210/110 (!) 186/94 (!) 164/90 (!) 142/80   Pulse: (!) 118  (!) 116 (!) 112   Resp: 18   18   Temp: 37.4 C (99.3 F)   37.2 C (99 F)   SpO2: 91%   93%       Lab Results   Component Value Date    WBC 12.5 (H) 05/27/2021    RBC 3.18 (L) 05/27/2021    HGB 8.5 (L) 05/27/2021    HCT 27.7 (L) 05/27/2021    MCV 87.1 05/27/2021    MCH 26.7 05/27/2021    MCHC 30.7 (L) 05/27/2021     Lab Results   Component Value Date    SODIUM 139 05/27/2021  POTASSIUM 4.0 05/27/2021    CO2 27 05/27/2021    BUN 29 (H) 05/27/2021    CREATININE  0.74 (L) 05/27/2021    GFR >90 05/27/2021    CALCIUM 9.9 05/27/2021    ALBUMIN 2.0 (L) 05/10/2021    ALKPHOS 80 05/17/2021    ALT 27 05/17/2021    AST 15 05/17/2021    ANIONGAP 6 05/27/2021     Lab Results   Component Value Date    CHOLESTEROL 108 05/25/2021    HDLCHOL 25 (L) 05/25/2021    LDLCHOL 45 05/25/2021    LDLCHOLDIR 89 03/24/2020    TRIG 212 (H) 05/27/2021      Lab Results   Component Value Date    HA1C 9.3 (H) 03/17/2021     Lab Results   Component Value Date    TSH 6.936 (H) 05/25/2021    FREET4 0.68 (L) 05/25/2021         Recent Labs     05/25/21  1559 05/25/21  1731 05/25/21  2033 05/25/21  2339 05/26/21  0635 05/26/21  0900 05/26/21  1154 05/26/21  2116 05/27/21  0220 05/27/21  0829   GLUIP 113* 96 170* 144* 212* 252* 77 92 187* 278*       Assessment/Recommendations:    Hien Perreira Montgomeryis a 76 y.o.malewith PMH of HLD, hypothyroidism, HTN, HIV admitted for abdominal pain found to have active extravasation at his surgical site from 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 Endocrinology is consulted for management of diabetes.    Post Pancreatectomy Diabetes Mellitus-improved control   - Most recent HbA1c on 03/17/2021 was 9.3%.  - Home regimen: Lantus 11 units nightly, Novolog 4 units TID AC.   - Current inpatient regimen:NPH 15units q.a.m., 27 units q.p.m.,Humulin R 12 units q8, scheduled with TPN andHumulin Rconservative sliding scale 4x/day PRN  MNT PROTOCOL FOR DIETITIAN  DIETARY ORAL SUPPLEMENTS Oral Supplements with tray: Ensure High Protein-Vanilla; BREAKFAST/LUNCH/DINNER; 1 Each  ROOM SERVICE:  NEEDS VISIT FOR MENU CHOICES  DIET DIABETIC Calorie amount: CC 2200  adult custom variable rate parenteral nutrition  adult custom variable rate parenteral nutrition    TPN for 12 hours a day and he tries to eat mostly liquids drinking ensure or juices as needed  His sugars today in am  was in 70 in BMP can be due to 27 units NPH he received earlier with sugars after that to 278 can be he drank some juice/ensure. He received 16 unit with TPN when sugars were running high    Recommendations:  Will reduce NPH at night to 25 units c/w NPH 15 units in morning  Will c/w humulin R 12 units TID during the time he gets TPN for 12 hour daily  During rest of day he needs prn coverage for sugars especially he has pancreatectomy done for prandial coverage. Since he is not eating that much and having nausea now but still drinking ensure and juice as tolerated we will recommend to c/w sliding scale Lispro TID and HS.  Once his diet improves he can be started on scheduled Lispro with meals.    Thank you for the consult        Marylu Lund, MD  Fellow, Endocrinology, Diabetes and Metabolism  05/27/2021  12:36

## 2021-05-27 NOTE — Care Management Notes (Signed)
[facility] Management Note    Patient Name: [name]  Date of Birth: [birth year]  Sex: male  Date/Time of Admission: 04/03/2021  2:01 PM  Room/Bed: 961/A  Payor: [facility] [employer] [employer] / Plan: Jolayne Panther VACCN/OPTUM / Product Type: Managed Care /    LOS: 54 days   Primary Care Providers:  [place] (General)    Admitting Diagnosis:  Intra abdominal hemorrhage [R58]    Assessment:      05/27/21 1137   Assessment Details   Assessment Type Continued Assessment   Date of Care Management Update 05/27/21   Date of Next DCP Update 05/28/21   Care Management Plan   Discharge Planning Status plan in progress   Projected Discharge Date 05/28/21   Discharge plan discussed with: Patient   CM will evaluate for rehabilitation potential yes   Patient choice offered to patient/family yes   Form for patient choice reviewed/signed and on chart no   Facility or Agency Preferences [facility]   Discharge Needs Assessment   Discharge Facility/Level of Care Needs Acute Rehab Placement/Return (not psych)(code 62)       Per service, pt is d/c ready pending placement.  MSW spoke to [employer] with [facility], she said they can offer a bed for pt tomorrow.  Amber stated that pt would need to bring his home HIV medication with him.  MSW informed pt of this, he said that it is taken care of.      Discharge Plan:  Acute Rehab Placement/Return (not psych) (code 54)      The patient will continue to be evaluated for developing discharge needs.     Case Manager: Joella Prince, [place]  Phone: [phone]

## 2021-05-27 NOTE — Care Plan (Signed)
Medical Nutrition Therapy Calorie Count Note  I: Estimated Needs:  Energy Calorie Requirements: 2620-3559 cal (28-30 cal/75.9 kg)  Protein Requirements (gms/day): 88-110 g prot (1.2-1.5 g/73.7 kg)  Fluid Requirements: 2100-2300 ml (28-30 ml/75.9 k)    Calorie Count day1:573calories, 37grams of protein, which met 25-27% of estimated calorie needs and 34-42% of estimated protein needs. Pt consumed 2 ensures providing 320 calories.    Calorie Count day2:Pt was NPO.    Calorie Count day3:350calories,11grams of protein, which met15-17% of estimated calorie needs and1-13% of estimated protein needs. Pthad no diner documented.    Calorie Count day4:322calories,24grams of protein, which met14-15% of estimated calorie needs and22-27% of estimated protein needs. Ptonly had breakfast documented.    Calorie Count day5:340calories,24grams of protein, which met15-16% of estimated calorie needs and22-27% of estimated protein needs. Ptonly had breakfast documented.Pt reports he did not have any lunch or dinner. Lunch and dinner was not documented on CC sheet.     Calorie Count day6:795calories,49grams of protein, which met35-38% of estimated calorie needs and45-56% of estimated protein needs. No documentation on CC sheet. Calories based on pt's statement. Pt consumed 2 ensures providing 320 calories.    Calorie Count day7:810calories,49grams of protein, which met35-39% of estimated calorie needs and45-56% of estimated protein needs.  Dinner had no documentation on CC sheet. Pt reports he did not have dinner. Pt consumed 2 ensures providing 320 calories.    CC to continue per service.    Please document % intake of all food, drinks and dietary supplements to help determine appropriate nutrition intervention.    Tracey Harries, [place]  05/27/2021, 09:09  [id number]

## 2021-05-27 NOTE — Consults (Signed)
Outpatient Parenteral Antimicrobial Therapy Program      Patient Name: [name]  MRN: [id number]  Patient Address: [address] [place] [address]  DOB: 02-Apr-1945  Date: 05/27/21    Outpatient Antimicrobial Treatment Plan     Diagnosis Group:  Diagnosis:   Microorganisms:   Abdominal/pelvic infection Intra-abdominal abscess with bacteremia  04/08/21 Blood cultures: P. Aeruginosa   04/09/21 Blood cultures: NGTD   04/09/21 Superficial wound culture: P. Aeruginosa, E. Faecium  04/11/2021 Blood cultures: NGTD   04/24/2021 Blood cultures: C. Albicans   04/26/2021 Blood cultures: NGTD   05/14/2021 Blood cultures: E. Faecium (daptomycin resistant)  05/17/2021 Blood cultures: NGTD        Antibiotics  IV Antibiotics:  Dose  Frequency Anticipated Stop date:  Comments:   Vancomycin 1250 mg  Every 24 hours  06/10/2021    Meropenem  1 g  Every 8 hours  06/10/2021    Oral Antibiotics:  Dose Frequency Anticipated Stop date:  Comment:   Fluconazole 400 mg Daily 06/10/2021      Duration reflects 3 weeks from necrosectomy.     Important drug interactions:      Allergies:   No Known Allergies         Labs monitoring plan/orders  Labs and Frequency Additional Comments   CBC + diff, hepatic function panel, inflammatory CRP every Monday    Creatinine, BUN, vancomycin trough every Monday and Thursday        OPAT Pharmacist:  Celedonio Miyamoto, PharmD   Phone Number: [phone]  Fax Labs To: [phone]     Physician Monitoring OPAT Course:   Dr. Matilde Sprang  Phone Number: [phone]  Fax Labs To: [phone]       ID follow-up:           Attending Physician    Dr. Cyndi Bender        If there are any changes to antibiotic plan or new culture results after the OPAT note has been placed, please contact us so we can update the OPAT note.      Celedonio Miyamoto, PharmD, [place]  Infectious Diseases Clinical Pharmacist  Ext. [phone]

## 2021-05-27 NOTE — Progress Notes (Signed)
[facility]  Surgical Oncology  Progress Note      [name], [name], 76 y.o. male  Date of Birth:  October 14, 1945  Date of Admission:  04/03/2021  Date of service: 05/27/2021    Assessment:  This is a 76 y.o. 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 He underwent massive transfusion protocol and ex lap on 04/03/21 at an outside facility with ligation of a bleeding vessel in the liver bed. Patient was subsequently transferred to [facility] postoperaively, intubated. Patient was subsequently extubated later that day on 3/4. 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 He failed advancing his diet to fulls the week of 4/08, backed off to TPN / clears after he developed severe nausea. Pt is now s/p repeat cystogastrostomy with GI on 4/20.     Plan/Recommendations:  -Diabetic diet as tolerated  -Midline wound (superior portion)   -Nursing to perform BID packing changes with kerlix and dakins 0.25% - ordered  -Eliquis 2.5 BID (resumed 4/25)   -Pt is to remain on 2.5 mg Eliquis upon discharge  - Positive blood culture    - One of two bottles positive for GPCs   - Repeat blood cultures 05/17/21 NGTD   - Continue antibiotics at this time  - Nausea control prn  - Peripancreatic fluid collections   - Advanced GI performed EUS with cystogastrostomy 4/6 and repeat 4/20              - Blood cultures 3/9: + pseudomonas    - Cultures form R JP: Pseudomonas and enterococcus              - ID consulted; recommend vancomycin and meropenum until 5/11  - PE   - s/p IVC filter placement 3/17  - Fine tremors:    - MRI brain and spine completed   - CTA intra/extra completed - mild atherosclerotic disease of ICAs  - Midline  fistula with ostomy bag to monitor output  - Appreciate endocrine recs regarding glucose control  - OOB TID, encourage ambulation  - Pulmonary toilet, keep IS accessible at bedside  - PT/OT ordered, encourage pt to work with therapies  - Dispo: Floor status. Plan for discharge to rehab today. He will discharge on TPN.       Subjective:   Resting comfortably in bed this am. States that he has not noticed any more output from his midline eiken's pouch. He is eager for discharge to rehab facility today. No acute concerns reported.    Objective  Filed Vitals:    05/26/21 1528 05/26/21 2216 05/26/21 2339 05/27/21 0500   BP: 134/88 127/66 129/80 (!) 163/91   Pulse: 94 (!) 102 97 (!) 104   Resp: '17 17 17 18   '$ Temp: 36.9 C (98.4 F) 36.5 C (97.7 F) 36.8 C (98.2 F) 36.8 C (98.2 F)   SpO2: 90% 95% 93% 91%     Physical Exam:   GEN:  AOx4, resting in bed, appears comfortable  PULM: Normal respiratory effort. Equal/symmetric chest rise.  CV:  Pink, well perfused  ABD:   Abdomen soft, nontender, nondistended. Eiken's pouch to midline without output in bag  MS: Atraumatic. Moves all extremities.  NEURO:  Alert and oriented to person, place and time. Cranial nerves grossly intact. Responds to all questions appropriately. Fine tremors of hands/arms noted b/l UE  Integumentary:  Pink, warm, and dry  PSYCHOSOCIAL: Anxious but cooperative. Normal affect.     Labs  Results for orders placed or performed during the hospital encounter of 04/03/21 (from the past 24 hour(s))   BASIC METABOLIC PANEL   Result Value Ref Range    SODIUM 139 136 - 145 mmol/L    POTASSIUM 4.0 3.5 - 5.1 mmol/L    CHLORIDE 106 96 - 111 mmol/L    CO2 TOTAL 27 23 - 31 mmol/L    ANION GAP 6 4 - 13 mmol/L    CALCIUM 9.9 8.8 - 10.2 mg/dL    GLUCOSE 75 65 - 125 mg/dL    BUN 29 (H) 8 - 25 mg/dL    CREATININE 0.74 (L) 0.75 - 1.35 mg/dL    BUN/CREA RATIO 39 (H) 6 - 22    ESTIMATED GFR >90 >=60 mL/min/BSA    Narrative    Hemolysis can alter results at this level  (slight).  Lipemia can alter results at this level (slight).   PHOSPHORUS   Result Value Ref Range    PHOSPHORUS 3.7 2.3 - 4.0 mg/dL    Narrative    Hemolysis can alter results at this level (slight).   MAGNESIUM   Result Value Ref Range    MAGNESIUM 2.3 1.8 - 2.6 mg/dL    Narrative    Hemolysis can alter results at this level (slight).  Lipemia can alter results at this level (slight).   CBC   Result Value Ref Range    WBC 12.5 (H) 3.7 - 11.0 x10^3/uL    RBC 3.18 (L) 4.50 - 6.10 x10^6/uL    HGB 8.5 (L) 13.4 - 17.5 g/dL    HCT 27.7 (L) 38.9 - 52.0 %    MCV 87.1 78.0 - 100.0 fL    MCH 26.7 26.0 - 32.0 pg    MCHC 30.7 (L) 31.0 - 35.5 g/dL    RDW-CV 17.2 (H) 11.5 - 15.5 %    PLATELETS 330 150 - 400 x10^3/uL    MPV 12.6 (H) 8.7 - 12.5 fL   TRIGLYCERIDE   Result Value Ref Range    TRIGLYCERIDES 212 (H) <150 mg/dL   POC BLOOD GLUCOSE (RESULTS)   Result Value Ref Range    GLUCOSE, POC 252 (H) 70 - 105 mg/dl   POC BLOOD GLUCOSE (RESULTS)   Result Value Ref Range    GLUCOSE, POC 77 70 - 105 mg/dl   POC BLOOD GLUCOSE (RESULTS)   Result Value Ref Range    GLUCOSE, POC 92 70 - 105 mg/dl   POC BLOOD GLUCOSE (RESULTS)   Result Value Ref Range    GLUCOSE, POC 187 (H) 70 - 105 mg/dl       I/O:  Date 05/26/21 0700 - 05/27/21 0659 05/27/21 0700 - 05/28/21 0659   Shift 0700-1459 1500-2259 2300-0659 24 Hour Total 0700-1459 1500-2259 2300-0659 24 Hour Total   INTAKE   P.O. 340 440 120 900         Oral 100 440 120 660         Enteral Med Flush/Free Water 240   240       I.V.(mL/kg/hr) 1820(3.15)   1820(1.05)         Volume Infused (adult custom variable rate parenteral nutrition) 1820   1820       IV Piggyback  400.3 100  500.3         Volume (meropenem (MERREM) 1 g in NS 100 mL IVPB minibag) 300.3 100  400.3         Volume (vancomycin (VANCOCIN) 500 mg in NS 100 mL IVPB) 100   100       Shift Total(mL/kg) 2560.3(35.41) 540(7.47) 120(1.66) 3220.3(44.54)       OUTPUT   Urine(mL/kg/hr) 1810(3.13) 1000(1.73) 775(1.34) 3585(2.07)          Urine (Voided) 1810 1000 775 3585         Urine Occurrence  0 x 1 x 1 x       Drains 0   0         Drain Output (Drain (Miscellaneous) peds ostomy bag Lower;Medial Abdomen) 0   0       Stool             Stool Occurrence  0 x  0 x       Shift Total(mL/kg) 5027(74.12) 8786(76.72) 775(10.72) 0947(09.62)       Weight (kg) 72.3 72.3 72.3 72.3 72.3 72.3 72.3 72.3       Radiology:  05/24/21 CT C/A/P   1.Postsurgical changes from distal pancreatectomy and splenectomy with complex fluid and multiple upper abdominal organized collections, further detailed above. Left upper quadrant collection has intervally decreased in size. Other collections are similar to minimally decreased in size. No new intra-abdominal collection is identified.  2.Midline abdominal wall incision with underlying fluid and air is also smaller in size when compared to prior examination.  3.Portion of the transverse colon abutting the ventral abdominal wall without frank communication identified.  4.Trace left pleural effusion with bibasilar atelectasis and bibasilar atelectasis.    05/24/21 MRI brain/spine: Read pending    Current Medications:  Current Facility-Administered Medications   Medication Dose Route Frequency    acetaminophen (TYLENOL) tablet  500 mg Oral Q4H PRN    adult custom variable rate parenteral nutrition   Intravenous Continuous    amLODIPine (NORVASC) tablet  10 mg Oral Daily    apixaban (ELIQUIS) tablet  2.5 mg Oral 2x/day    atorvastatin (LIPITOR) tablet  40 mg Oral Daily with Breakfast    benzonatate (TESSALON) capsule  100 mg Oral Q8H PRN    D5W 250 mL flush bag   Intravenous Q15 Min PRN    diatrizoate meglumine & sodium oral solution  8 mL Oral Give in Radiology    elvitegravir-cobicistat-emtricitrabine-tenofovir (GENVOYA) 150-150-200-10 mg per tablet  1 Tablet Oral Daily    fluconazole (DIFLUCAN) tablet  400 mg Oral Daily    gabapentin (NEURONTIN) capsule  300 mg Oral 3x/day    insulin NPH human 100 units/mL injection  15 Units  Subcutaneous Daily before Breakfast    insulin NPH human 100 units/mL injection  27 Units Subcutaneous Daily after Dinner    insulin R human (HUMULIN R) 100 units/mL injection - for tube feed coverage  12 Units Subcutaneous 3x/day    labetalol (TRANDATE) 5 mg/mL injection  10 mg Intravenous Q2H PRN    levothyroxine (SYNTHROID) tablet  88 mcg Oral QAM    melatonin tablet  6 mg Oral NIGHTLY    meropenem (MERREM) 1 g in NS 100 mL IVPB minibag  1 g Intravenous Q8H    mirtazapine (REMERON) tablet  7.5 mg Oral NIGHTLY    multivitamin-minerals-folic acid-lycopene-lutein (CERTAVITE SENIOR) tablet  1 Tablet Oral Q24H    NS 250 mL flush bag   Intravenous Q15  Min PRN    NS flush syringe  10-30 mL Intracatheter Q8HRS    NS flush syringe  20-30 mL Intracatheter Q1 MIN PRN    NS flush syringe  2-6 mL Intracatheter Q8HRS    NS flush syringe  2-6 mL Intracatheter Q1 MIN PRN    pantoprazole (PROTONIX) delayed release tablet  40 mg Oral Q12H    prochlorperazine (COMPAZINE) 5 mg/mL injection  10 mg Intravenous Q6H PRN    promethazine (PHENERGAN) tablet  6.25 mg Oral Q6H PRN    scopolamine 1 mg over 3 days transdermal patch  1 Patch Transdermal Q72H    sennosides-docusate sodium (SENOKOT-S) 8.6-'50mg'$  per tablet  1 Tablet Oral 2x/day    sodium hypochlorite (DAKINS) 0.25% (half strength) irrigation   Irrigation Q12H    SSIP insulin R human (HUMULIN R) 100 units/mL injection  0-12 Units Subcutaneous Q6H PRN    vancomycin (VANCOCIN) 500 mg in NS 100 mL IVPB  500 mg Intravenous Q12H    Vancomycin IV - Pharmacist to Dose per Protocol   Does not apply Daily PRN       Donna Christen, MD  General Surgery // PGY-1  [facility]    I saw and examined the patient.  I reviewed the resident's note.  I agree with the findings and plan of care as documented in the resident's note.  Any exceptions/additions are edited/noted.    Aaron Edelman A. Cyndi Bender, MD, FACS

## 2021-05-27 NOTE — Pharmacy (Signed)
[name] / Department of Pharmaceutical Services  Therapeutic Drug Monitoring: Vancomycin  05/27/2021      Patient name: [name], [name]  Date of Birth:  [birth year]    Actual Weight:  Weight: 72.3 kg (159 lb 6.3 oz) (05/26/21 0730)     BMI:  BMI (Calculated): 23.58 (05/26/21 0730)      Date RPh Current regimen (including mg/kg) Indication &  Organism AUC or trough based dosing Target Levels^ SCr (mg/dL) CrCl* (mL/min) Infectious Laboratory Markers (as applicable)   Measured level(s)   (mcg/mL) Calculated AUC (if AUC based monitoring) Plan & predicted AUC/trough if initial dosing (including when levels are due) Comments   3/9 Sarah Initiating Intra-abdominal AUC 400-600 0.69 92.5 WBC: 35.5     - Initiate vancomycin 1g q12h (~13 mg/kg)   - predicted AUC 453  - levels in 48 hrs if continued  - MRSA swab ordered in case of concern for pneumonia    03/11 1154 kop Vancomycin 1gm q12h IAB/bacteremia AUC  0.8 ~92 34.0 Pk 20.9 (ext to 22.5)  Tr 12.0 (ext to 11.95) 402 -AUC in low end of acceptable range  -SCR has increased slightly so will continue same dose for now to avoid accummulation - cefepime increased to q8h per ID rec-growing PSA in blood  -monitor renal closely  +flagyl   3/13 Sarah          - Vancomycin discontinued, switched to daptomycin                   4/20 Sarah Re-initiating Intra-abdominal infection/bacteremia (E. Faecium)  AUC 400-600 0.8 79.8 WBC: 19.0   - Reinitiate vancomycin 750 mg q12h (~10 mg/kg)  - predicted AUC 421  - Levels after 4th/before 5th dose    4/22 CMT 750 mg q 12 h Intra-abdominal infection/bacteremia (E. Faecium) AUC 400-600 0.67 95.3 WBC: 16.2 Pk 20.9 (actual 24.1)  Tr 14.4 (actual 13.4) 441 Continue vancomycin 750 mg q 12h  Predicted AUC 441  Levels have been in range twice - can move to twice-weekly trough monitoring (pt > 65 years and on Q 12 H dosing)    4/25 kmh Vanc 750 mg q12h Intra-abdominal/bacteremia Trough 10-15 0.7 91.2  Trough 17.3  Vanc 500 mg q12h. Draw pk/tr  after 4 doses or sooner if clinically indicated. Predicted peak 20.5 and trough 12.2.    05/27/21 LL '500mg'$  q12hr Above AUC 400-600 0.74 86.3 WBC: 12.5  CRP: 36.6 Peak 14.4  Trough 13.8  AUC 337 Peak 16.3   Trough 13.8  AUC 399 Levels slightly low. Adjusting to 1,'250mg'$  q24hr for predicted AUC 499, trough 13.7 OPAT consulted                                                    ^Target levels depends on dosing and monitoring method, AUC vs. trough based. For AUC based dosing units are mg*h/L. For trough based dosing units are mcg/mL.     *Creatinine clearance is estimated by using the Cockcroft-Gault equation for adult patients and the Carol Ada for pediatric patients.    The decision to discontinue vancomycin therapy will be determined by the primary service.  Please contact the pharmacist with any questions regarding this patient's medication regimen.

## 2021-05-27 NOTE — Care Plan (Signed)
[facility]  Physical Therapy Progress Note      Patient Name: [name]  Date of Birth: 1946/01/01  Height:  175.3 cm (5' 9.02")  Weight:  72.3 kg (159 lb 6.3 oz)  Room/Bed: 961/A  Payor: [facility] [employer] [employer] / Plan: [place] [facility] / Product Type: Managed Care /     Assessment:     [name] was alert and agreeable to PT tx. He demonstrated a brighter affect today. He demonstrated improvement in all aspects of mobility and improved gt tol. He is progressing well but continues to demonstrate generalized weakness, decreased activity tol, impaired balance, and impaired mobility. His mobility is not currently adequate for safe d/c to home but he has good rehab potential. Rec IRF at d/c.    Discharge Needs:   Equipment Recommendation: TBD  Discharge Disposition: inpatient rehabilitation facility    JUSTIFICATION OF DISCHARGE RECOMMENDATION   Based on current diagnosis, functional performance prior to admission, and current functional performance, this patient requires continued PT services in inpatient rehabilitation facility in order to achieve significant functional improvements in these deficit areas: aerobic capacity/endurance, gait, locomotion, and balance, muscle performance.      Plan:   Continue to follow patient according to established plan of care.  The risks/benefits of therapy have been discussed with the patient/caregiver and he/she is in agreement with the established plan of care.     Subjective & Objective:        05/27/21 1637   Therapist Pager   PT Assigned/ Pager # [name] [phone]   Rehab Session   Document Type therapy progress note (daily note)   Total PT Minutes: 20   Patient Effort good   Symptoms Noted During/After Treatment fatigue   General Information   Patient Profile Reviewed yes   Patient/Family/Caregiver Comments/Observations Pt was resting in bed with his eyes closed but easily woke. He was agreeable to PT tx. Wife was at b/s and  supportive.   Medical Lines PICC Line;Peripheral Drain   Respiratory Status room air   Existing Precautions/Restrictions contact isolation;fall precautions;full code   General Observations of Patient Pt was seen at b/s on 9e for PT with permission of b/s nurse.   Mutuality/Individual Preferences   Individualized Care Needs TPN; OOB with FWW and assist x 1   Pre Treatment Status   Pre Treatment Patient Status Patient supine in bed;Call light within reach;Telephone within reach   Support Present Pre Treatment  Family present   Communication Pre Treatment  Nurse   Cognitive Assessment/Interventions   Behavior/Mood Observations alert;cooperative;behavior appropriate to situation, WNL/WFL   Attention WNL/WFL   Follows Commands WFL   Pain Assessment   Pretreatment Pain Rating 0/10 - no pain   Posttreatment Pain Rating 0/10 - no pain   Mobility Assessment/Training   Comment Pt transferred to sitting EOB. Stood to [facility] and ambulated in hall with FWW and assist x 1 plus chair follow. He returned to bed at end of tx.   Bed Mobility Assessment/Treatment   Bed Mobility, Assistive Device bed rails;Head of Bed Elevated   Supine-Sit Independence contact guard assist   Safety Issues decreased use of arms for pushing/pulling;decreased use of legs for bridging/pushing;impaired trunk control for bed mobility   Impairments balance impaired;endurance;strength decreased;coordination impaired;postural control impaired   Comment Pt transferred to sitting EOB with less effort needed.   Transfer Assessment/Treatment   Sit-Stand Independence contact guard assist;verbal cues required   Stand-Sit Independence contact guard assist;verbal cues  required   Sit-Stand-Sit, Assist Device walker, front wheeled   Transfer Safety Issues weight-shifting ability decreased;step length decreased;balance decreased during turns   Transfer Impairments balance impaired;coordination impaired;endurance;postural control impaired;strength decreased   Transfer Comment  Pt needed verbal cues for hand placement.   Gait Assessment/Treatment   Independence  contact guard assist;1 person + 1 person to manage equipment   Assistive Device  walker, front wheeled   Distance in Feet ~ 180 ft x 2   Gait Speed mildly decreased; improving   Deviations  double stance time increased;weight-shifting ability decreased;swing-to-stance ratio decreased;step length decreased;stride length decreased   Safety Issues  step length decreased;weight-shifting ability decreased   Impairments  balance impaired;endurance;strength decreased;postural control impaired   Comment Pt's cadence and step length were improved today.   Balance Skill Training   Comment with FWW for standing balance   Sitting Balance: Static fair + balance   Sitting, Dynamic (Balance) fair - balance   Sit-to-Stand Balance fair - balance   Standing Balance: Static fair balance   Standing Balance: Dynamic fair - balance   Systems Impairment Contributing to Balance Disturbance musculoskeletal   Identified Impairments Contributing to Balance Disturbance impaired coordination;impaired postural control;decreased strength   Post Treatment Status   Post Treatment Patient Status Patient supine in bed;Call light within reach;Telephone within reach   Support Present Post Treatment  Family present   Communication Klemme With patient;spouse   Basic Mobility Am-PAC/6Clicks Score (APPROVED Staff)   Turning in bed without bedrails 4   Lying on back to sitting on edge of flat bed 3   Moving to and from a bed to a chair 3   Standing up from chair 3   Walk in room 3   Climbing 3-5 steps with railing 2   6 Clicks Raw Score total 18   Standardized (t-scale) score 41.05   CMS 0-100% Score 40.47   CMS Modifier CK   Patient Mobility Goal (JHHLM) 6- Walk 10 steps or more 2X/day   Exercise/Activity Level Performed 8- Walked 250 feet or more   Physical Therapy Clinical Impression   Assessment Mr.  Ardoin was alert and agreeable to PT tx. He demonstrated a brighter affect today. He demonstrated improvement in all aspects of mobility and improved gt tol. He is progressing well but continues to demonstrate generalized weakness, decreased activity tol, impaired balance, and impaired mobility. His mobility is not currently adequate for safe d/c to home but he has good rehab potential. Rec IRF at d/c.   Anticipated Equipment Needs at Discharge (PT) TBD   Anticipated Discharge Disposition inpatient rehabilitation facility       Therapist:   Norlene Duel, PT   Pager #: [phone]

## 2021-05-27 NOTE — Discharge Summary (Addendum)
[facility]  DISCHARGE SUMMARY    PATIENT NAME:  Yandell, Mcjunkins  MRN:  [id number]  DOB:  [birth year]    ENCOUNTER DATE:  04/03/2021  INPATIENT ADMISSION DATE: 04/03/2021  DISCHARGE DATE:  05/28/2021    ATTENDING PHYSICIAN: Nicola Police, MD  SERVICE: SURG ONCOLOGY  PRIMARY CARE PHYSICIAN: [place]       No lay caregiver identified.      PRIMARY DISCHARGE DIAGNOSIS: Intra abdominal hemorrhage  Active Hospital Problems    Diagnosis Date Noted    Principal Problem: Intra abdominal hemorrhage [R58] 04/03/2021    DVT (deep venous thrombosis) (CMS HCC) [I82.409] 04/03/2021      Resolved Hospital Problems   No resolved problems to display.     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    Cellulitis 01/04/2021    Abscess of left groin 01/03/2021    Gram-positive cocci bacteremia 01/03/2021    Sepsis due to cellulitis (CMS HCC) 01/02/2021    Lactic acidosis 01/02/2021    Rash 01/02/2021    Candida infection of genital region 11/03/2020    Chronic renal failure syndrome 11/03/2020    Postherpetic neuralgia 11/03/2020    Hyperlipidemia 11/03/2020    Thyroid disorder 11/03/2020    Sprain of knee 11/03/2020    Seborrhea 11/03/2020    Positive laboratory testing for human immunodeficiency virus (CMS [place]) 11/03/2020    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    Actinic keratosis 11/03/2020    Multiple actinic keratoses 11/03/2020    Malignant neoplasm of prostate (CMS [place]) 11/03/2020    Incisional hernia 11/03/2020    Fall 11/03/2020    Diverticulitis of colon 11/03/2020    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[place]) 04/19/2020    Acute cystitis without hematuria 04/19/2020    Chest pain 03/24/2020    Near syncope 03/24/2020    Acute kidney injury (CMS [place]) 03/24/2020    Pancreatic mass 03/24/2020    GERD without  esophagitis 03/24/2020    Essential hypertension 03/24/2020    Acquired hypothyroidism 03/24/2020    Mixed hyperlipidemia 03/24/2020    HIV infection (CMS [place]) 03/24/2020    Generalized anxiety disorder 03/13/2020    Cervicalgia 02/06/2020    Low back pain 02/06/2020    Pain in left knee 11/29/2019    Inflammation of sacroiliac joint (CMS [name]) 02/15/2019    Acute respiratory distress syndrome (ARDS) (CMS HCC) 01/17/2019    Depressive disorder 01/17/2019    Diverticulitis 01/17/2019    Herpes zoster 01/17/2019    Asymptomatic HIV infection (CMS [place]) 01/17/2019    Risk of exposure to communicable disease 08/22/2018    Nausea 08/22/2018           Current Discharge Medication List        START taking these medications.        Details   fluconazole 200 mg Tablet  Commonly known as: DIFLUCAN   400 mg, Oral, DAILY  Qty: 26 Tablet  Refills: 0     insulin lispro 100 units/mL Injectable   0-12 Units, Subcutaneous, 4 TIMES DAILY PRN  Refills: 0     * insulin NPH isoph U-100 human 100 unit/mL Suspension   25 Units, Subcutaneous, EVERY EVENING AFTER DINNER  Refills: 0     * insulin NPH isoph U-100 human  100 unit/mL Suspension   15 Units, Subcutaneous, EVERY MORNING BEFORE BREAKFAST  Refills: 0     insulin regular human 100 unit/mL Solution  Commonly known as: HUMULIN R   12 Units, Subcutaneous, 3 TIMES DAILY  Refills: 0     meropenem 1 g in NS 100 mL infusion   1 g, Intravenous, EVERY 8 HOURS, Mix and infuse per policy of [facility].  Refills: 0     prochlorperazine 10 mg/2 mL (5 mg/mL) Solution  Commonly known as: COMPAZINE   10 mg, Intravenous, EVERY 6 HOURS PRN  Refills: 0     promethazine 12.5 mg Tablet  Commonly known as: PHENERGAN   6.25 mg, Oral, EVERY 6 HOURS PRN  Refills: 0     sennosides-docusate sodium 8.6-50 mg Tablet  Commonly known as: SENOKOT-S   1 Tablet, Oral, 2 TIMES DAILY  Refills: 0     vancomycin 1,250 mg in NS 12.5 mL infusion   17 mg/kg (1,250 mg), Intravenous, EVERY 24 HOURS, Mix and infuse  per policy of [facility].  Qty: 1 Each  Refills: 0           * This list has 2 medication(s) that are the same as other medications prescribed for you. Read the directions carefully, and ask your doctor or other care provider to review them with you.                CONTINUE these medications which have CHANGED during your visit.        Details   apixaban 2.5 mg Tablet  Commonly known as: ELIQUIS  What changed:   medication strength  how much to take  Another medication with the same name was removed. Continue taking this medication, and follow the directions you see here.   2.5 mg, Oral, 2 TIMES DAILY  Qty: 180 Tablet  Refills: 0            CONTINUE these medications - NO CHANGES were made during your visit.        Details   acetaminophen 500 mg Tablet  Commonly known as: TYLENOL   1,000 mg, Oral, EVERY 4 HOURS PRN  Refills: 0     amLODIPine 5 mg Tablet  Commonly known as: NORVASC   5 mg, Oral, DAILY  Refills: 0     atorvastatin 80 mg Tablet  Commonly known as: LIPITOR   40 mg, Oral, EVERY MORNING WITH BREAKFAST  Refills: 0     BIOTIN ORAL   1 Tablet, Oral, DAILY  Refills: 0     Blood Sugar Diagnostic Strip  Commonly known as: Accu-Chek Guide test strips   1 Strip, Does not apply, 4 TIMES DAILY - BEFORE MEALS AND NIGHTLY  Qty: 150 Each  Refills: 3     cetirizine 10 mg Tablet  Commonly known as: ZYRTEC   10 mg, Oral, DAILY  Refills: 0     cyanocobalamin 1,000 mcg Tablet  Commonly known as: VITAMIN B 12   1,000 mcg, Oral, EVERY MORNING  Refills: 0     FreeStyle Libre 2 Reader Misc  Generic drug: flash glucose scanning reader   Use as directed with FreeStyle Libre 2 sensors  Qty: 1 Each  Refills: 1     FreeStyle Libre 2 Sensor Kit  Generic drug: flash glucose sensor   To check blood glucose continuously. Change every 14 days  Qty: 6 Each  Refills: 3     gabapentin 300 mg Capsule  Commonly  known as: NEURONTIN   1 Tablet, Oral, 3 TIMES DAILY  Refills: 0     Genvoya 150-150-200-10 mg Tablet  Generic drug:  elviteg-cob-emtri-tenof ALAFEN   1 Tablet, Oral, DAILY  Refills: 0     lancets 28 gauge Misc  Commonly known as: FreeStyle Lancets   Use to test blood sugar 4 times a day - before meals and bedtime  Qty: 150 Each  Refills: 3     levothyroxine 88 mcg Tablet  Commonly known as: SYNTHROID   88 mcg, Oral, EVERY MORNING  Refills: 0     melatonin 5 mg Tablet   1 Tablet, Oral, NIGHTLY  Refills: 0     MULTIVITAMIN ORAL   1 Tablet, Oral, DAILY  Refills: 0     pantoprazole 40 mg Tablet, Delayed Release (E.C.)  Commonly known as: PROTONIX   40 mg, Oral, 2 TIMES DAILY, 30 minutes before a meal  Refills: 0     POTASSIUM ORAL   1 Tablet, Oral, DAILY  Refills: 0            STOP taking these medications.      insulin aspart U-100 100 unit/mL (3 mL) Insulin Pen  Commonly known as: NOVOLOG     insulin glargine 100 unit/mL Insulin Pen  Commonly known as: Lantus Solostar U-100 Insulin     insulin syr/ndl U100 half mark 0.3 mL 31 gauge x 5/16" Syringe     Pen Needle (Disposable) 32 gauge x 5/32" Needle  Commonly known as: BD Nano 2nd Gen Pen Needle            Discharge med list refreshed?  YES     No Known Allergies  HOSPITAL PROCEDURE(S):   Orders Placed This Encounter   Procedures    BEDSIDE  CENTRAL LINE INSERTION/CHANGE/REMOVAL    RETROGRADE CHOLANGIOPANCREATOGRAPHY ENDOSCOPIC    ENDOSCOPIC ULTRASOUND UPPER    ENDOSCOPIC ULTRASOUND UPPER     Surgical/Procedural Cases on this Admission       Case IDs Date Procedure Surgeon Location Status    [id number] 04/09/21 IR EMBOLIZATION/BODYIR ABSCESS/FLUID DRAINAGE OF(PAFD) Rod Holler, MD [place] [facility]    [phone] 04/16/21 IR IVC [facility] VENA CAVA FILTER PLACEMENT Kerney Elbe, MD [place] [facility]    [id number] 04/22/21 [place] U/S UPPER Shannan Harper, MD [place] [facility]    [id number] 05/06/21 ENDOSCOPIC U/S WITH CYST GASTROSTOMY Shannan Harper, MD [place] [facility]    [id number] 05/20/21 ENDOSCOPIC U/S WITH CYST GASTROSTOMY Shannan Harper, MD [place] [facility]           REASON FOR HOSPITALIZATION AND HOSPITAL COURSE   BRIEF HPI: This is a 76 y.o. 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 He underwent massive transfusion protocol and ex lap on 04/03/21 at an outside facility with ligation of a bleeding vessel in the pancreatectomy bed. Patient was subsequently transferred to [facility] postoperaively, intubated. Patient was subsequently extubated later that day on 3/4.     BRIEF HOSPITAL NARRATIVE:   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 He continues to struggle with PO intake and is currently on TPN cycled 14 hours at night. He is on a regular diet with nutritional supplements ordered  as well. The patient has a history of PE and was restarted on Eliquis 2.5 mg BID. Given the increased bleeding risk of the Eliquis in combination with his Genvoya he should not be advanced from this dose. Pt will continue on merrem, vancomycin, and diflucan until Jun 10, 2021.     IR intervention:  1. The patient was taken to IR on 04/09/21 for increased bloody output from JP drain. He underwent angiogram with empiric embolization of splenic artery stump and left interior adrenal artery. He also had IR drain placement in large RUQ fluid collection.   2. The patient also ha a IVC filter placed given history of PE and intermittent holding of anticoagulation, this was placed on 04/16/21.    GI intervention:  1. 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   2. 05/06/21 EUS: After maturation of walled off necrosis the patient underwent a endoscopic cystogastrostomy and  necrosectomy.   3. 05/20/21 EUS: Persistent LUQ collection was seen on CT imaging and endoscopic cystogastrostomy was preformed with necrosectomy.     Consultations:  Infectious Disease: Patient with history of HIV stable on Genyoya. 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 IR placed a drain to RUQ on 04/09/2021 with cultures positive for Pseudomonas aeruginosa and Enterococcus faecium.  He was also identified to have a Pseudomonas bacteremia on 3/9. In total he was treated with a month of Daptomycin and meropenem completed 05/09/2021. 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 Off antibiotics the patient developed worsening leukocytosis with repeat blood cultures 05/14/2021 were positive for Daptomycin resistant Enterococcus faecium. Repeat imaging identified persistent intra-abdominal abscess. He underwent cystogastrostomy tube placement on 05/20/2021 with necrosectomy. Suspect polymicrobial infection.   antibiotics:  1. Vancomycin, cefepime, and metronidazole 3/9 - 04/12/2021    2. Daptomycin and meropenem 04/12/2021 - 05/09/2021  3. Micafungin 04/25/2021 - 04/27/2021  4. Fluconazole 3/29 - 05/09/2021  5. Daptomycin and Meropenem 05/16/2021 - 05/20/2021  6. Vancomycin and meropenem 05/20/2021 - 06/10/21    Endocrinology:  Hyperglycemia currently on TPN.   NPH insulin 25 U at night and 15 U in morning.   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 CT intra/extra cranial was obtained and showed no significant vessel  calcifications.       Patient will discharge on TPN upon discharge:  Nutrition recommendations regarding TPN:  "Continue current TPN at this time.  Note triglycerides were increasing so decreased lipids from 840 cal to 420 calories which decreased total calories from 2100 to 1680 cal.  Current TPN meeting 70% of energy needs.     Once at rehab, RD there will follow.  Recommend monitoring triglycerides closely 2-3 times/day for tolerance and if plan is to advance TPN back to goal as able: recommend   Goal  TPN  Cycled for 14 hours at night (70 ml the first and last hour and 140 ml x 12 hours)  2100 kcal /day = 28 kcal/kg  100 grams Protein = 1.4 grams/ kg  860 Dextrose kcals, GIR 2.3  840 IL kcals or 1.1 grams Fat/kg (40 % of total calories)      Note blood sugars have been hard to control as well so unable to advance dextrose. Monitor weekly weights as well"    CONDITION ON DISCHARGE:  A. Ambulation: Up with assistance only  B. Self-care Ability: With partial assistance  C. Cognitive Status Alert and Oriented x 3  D. Code status at discharge: full code      LINES/DRAINS/WOUNDS AT DISCHARGE:   Patient Lines/Drains/Airways Status       Active Line / Dialysis Catheter / Dialysis Graft / Drain / Airway / Wound       Name Placement date Placement time Site Days    PICC Triple Lumen Lumen 1 Red Lumen 2 White Lumen 3 Gray Left;Brachial Vein Central 04/29/21  1055  4 FR  28    Drain (Miscellaneous) peds ostomy bag Lower;Medial Abdomen 04/12/21  1300  -- 45    Surgical Incision Mid;Anterior Abdomen 04/03/21  1046  -- 54    Surgical Incision Other (Comment) Left;Upper Abdomen 04/03/21  1400  -- 54                    DISCHARGE DISPOSITION:  [place] INSTRUCTIONS:  Post-Discharge Follow Up Appointments       Follow up with Infectious Disease, Physician Office Center    Phone: [phone]    Where: [place], [place] [address]    Follow up with Interventional Radiology, Physician Office  Center    Phone: [phone]    Where: [address] [place] [address]    Follow up with Infectious Disease, Phys[facility] [place]    Phone: [phone]    Where: [place], [place] [address]      Thursday Jun 03, 2021    CT Scan with Riesa Pope at  3:30 PM      Wednesday Sep 29, 2021    New Patient Visit with Harold Barban, MD at  8:00 AM      Neurology, [facility], [place] [address]  [phone] Radiology Department, [facility], [place]  [address]  [place] [address]  [phone]             Refer to [facility] Neurology[facility]   Referral Type: Physician Referral-Office Visits   Number of Visits Requested: 1     DISCHARGE INSTRUCTION - IMPORTANT INFORMATION    GENERAL INFORMATION: Contact the neurology office for questions/concerns Monday-Friday 7 a.m. - 5 p.m. at [phone].     DISCHARGE INSTRUCTION - DIABETIC DIET    As instructed while in hospital.     Diet: DIABETIC DIET      DISCHARGE INSTRUCTION - ACTIVITY - NO DRIVING WHILE TAKING NARCOTIC PAIN MEDICATION     Activity: NO DRIVING WHILE TAKING NARCOTICS      DISCHARGE INSTRUCTION - INCISION/WOUND CARE    POST-OPERATIVE WOUND CARE   From the day of placement, the stiches/staples on the surgical wound will be removed in 10-14 days. You will have an appointment with your surgeon for the removal.   [place] the wounds at least once a day with warm, soapy water.  You may do this easily in the shower. No swimming or submerging the wound, like in a pool, bathtub or hot tub.  Pat the wound dry. DO NOT RUB!  Please refer to your diet and activity instructions. You are on these restrictions until your clinic appointment(s) and evaluation of your healing.  SEEK MEDICAL ATTENTION IF:  There is redness, swelling or  increased pain in the wound that is not controlled with pain medications as prescribed.  There is drainage, blood or pus coming from the wound lasting longer than 1 day, or sooner if there is a concern.  You develop signs of a generalized infection including muscle aches, chills, fever or a general ill feeling.  You notice a foul smell coming from the wound or dressing.  You developed persistent nausea or vomiting.     Instructions for incision/wound care: z - other (specify in comments)      DISCHARGE INSTRUCTION - INCISION/WOUND CARE    POST-OPERATIVE WOUND CARE   From the day of placement, the stiches/staples on the surgical wound will be removed in 10-14 days. You will have an appointment with your surgeon for the removal.   [place] the wounds at least once a day with warm, soapy water. You may do this easily in the shower. No swimming or submerging the wound, like in a pool, bathtub or hot tub.  Pat the wound dry. DO NOT RUB!  Please refer to your diet and activity instructions. You are on these restrictions until your clinic appointment(s) and evaluation of your healing.  SEEK MEDICAL ATTENTION IF:  There is redness, swelling or increased pain in the wound that is not controlled with pain medications as prescribed.  There is drainage, blood or pus coming from the wound lasting longer than 1 day, or sooner if there is a concern.  You develop signs of a generalized infection including muscle aches, chills, fever or a general ill feeling.  You notice a foul smell coming from the wound or dressing.  You developed persistent nausea or vomiting.     Instructions for incision/wound care: z - other (specify in comments)      DISCHARGE INSTRUCTION - SIGNS AND SYMPTOMS OF INFECTION    SIGNS AND SYMPTOMS OF INFECTION    Please watch your incision/wound site for the following signs of potential infection:    Increased redness or warmth at the incision site.  Drainage from the wound that may be foul smelling,  cloudy, yellow or green in color.  Bulging or increased swelling at the incision site.  A temperature of more than 101.5 degrees F by mouth for 2 readings taken 4 hours apart.  A sudden increase in pain at the wound that is not relieved with pain medication.     DISCHARGE INSTRUCTION - MISC    Please continue the eiken's pouch (looks similar to an ostomy appliance) to the midline fistula site and monitor the output. On the superior midline incision site there is a small opening. Please continue to change the packing at this superior site twice daily with 0.25% dakins soaked packing gauze and cover with a bandage.     DISCHARGE INSTRUCTION - MISC    You need to remain on a lower dose of Eliquis. You will be discharged on 2.46m Eliquis twice daily. Please continue this dosing and frequency until you hear differently from Dr. BMadaline Guthrieoffice.     SCHEDULE FOLLOW-UP MED [place]  Follow-up in: 2 WEEKS    Reason for visit: HOSPITAL DISCHARGE    Follow-up reason: Pseudomonas bacteremia, Intra-abdominal infected hematoma    Coordination with other outpt. appts.? YES (provide details in comment section & place separate orders for those items) Pleae coordinate with colorectal surgery appointment if possible   Provider: Dr. Alfonzo Feller      FOLLOW-UP: VASCULAR INTERVENTIONAL RADIOLOGY - POC - [place], [place]    Phone follow up to evaluate for possible removal.     Follow-up in: OTHER    Other, Please Specify: 3 months    Reason for visit: POST-OP VISIT    Follow-up reason: s/p IVC filter placement, eval for removal    Provider: APP      SCHEDULE FOLLOW-UP MED SPEC - INFECTIOUS DISEASE - PHYSICIAN OFFICE CENTER     Follow-up in: 2 WEEKS    Reason for visit: HOSPITAL DISCHARGE    Follow-up reason: intra-abdominal abscess    Provider: Dr. Jabier Mutton or Dr. Alfonzo Feller      SCHEDULE FOLLOW-UP [facility] - SURGICAL ONCOLOGY     Follow-up in: 2 WEEKS    Reason for visit: [place]     Follow-up reason: open distal pancreatectomy/splenectomy, midline fistulized with stool output    Provider: Dr. Joetta Manners, MD    Copies sent to Care Team         Relationship Specialty Notifications [place] PCP - General EXTERNAL  03/19/18     Phone: [phone] Fax: [phone]         [address] [place] [address]            Referring providers can utilize [email or web address] to access their referred [place] patient's information.     Addendum: updated endocrinology recommendations  Recommendations:  Will increase humulin R to 14 units TID scheduled to be given during 12 hours period when TPN is running  C/w NPH 15 units in am and 25 units in pm for now  Will recommend to start with SSI conservative lispro to be given with meals for now.      Donna Christen, MD  General Surgery // PGY-1  [place]

## 2021-05-28 ENCOUNTER — Encounter (INDEPENDENT_AMBULATORY_CARE_PROVIDER_SITE_OTHER)
Admit: 2021-05-28 | Discharge: 2021-05-28 | Disposition: A | Discharge status: Home / Self Care | Payer: Self-pay | Attending: Internal Medicine | Admitting: Internal Medicine

## 2021-05-28 DIAGNOSIS — Z0489 Encounter for examination and observation for other specified reasons: Secondary | ICD-10-CM

## 2021-05-28 LAB — POC BLOOD GLUCOSE (RESULTS)
GLUCOSE, POC: 231 mg/dL — ABNORMAL HIGH (ref 70–105)
GLUCOSE, POC: 229 mg/dL — ABNORMAL HIGH (ref 70–105)
GLUCOSE, POC: 254 mg/dL — ABNORMAL HIGH (ref 70–105)
GLUCOSE, POC: 93 mg/dL (ref 70–105)

## 2021-05-28 MED ORDER — ALTEPLASE 1 MG/2 ML SYRINGE
1.0000 mg | INJECTION | INTRAMUSCULAR | Status: AC
Start: 2021-05-28 — End: 2021-05-28
  Administered 2021-05-28: 1 mg
  Filled 2021-05-28: qty 2

## 2021-05-28 MED ORDER — VANCOMYCIN 10 GRAM INTRAVENOUS SOLUTION
17.0000 mg/kg | INTRAVENOUS | 0 refills | Status: DC
Start: 2021-05-28 — End: 2021-06-23

## 2021-05-28 MED ORDER — HYDROXYZINE PAMOATE 25 MG CAPSULE
25.0000 mg | ORAL_CAPSULE | Freq: Once | ORAL | Status: AC
Start: 2021-05-28 — End: 2021-05-28
  Administered 2021-05-28: 25 mg via ORAL
  Filled 2021-05-28: qty 1

## 2021-05-28 MED ORDER — INSULIN REGULAR HUMAN 100 UNIT/ML INJECTION - FOR TUBE FEED
14.0000 [IU] | Freq: Three times a day (TID) | INTRAMUSCULAR | Status: DC
Start: 2021-05-28 — End: 2021-05-28
  Administered 2021-05-28: 14 [IU] via SUBCUTANEOUS

## 2021-05-28 NOTE — Nurses Notes (Addendum)
Paged surgery oncology: pt [phone] very anxious. Pt requesting medicine. [place] [address]    Arloa Koh, LPN  07/05/5407, 01:17    Dr. Lenna Gilford vistaril. Arloa Koh, LPN  09/10/9145, 01:26

## 2021-05-29 ENCOUNTER — Other Ambulatory Visit: Payer: 59 | Attending: NURSE PRACTITIONER | Admitting: NURSE PRACTITIONER

## 2021-05-29 LAB — CBC WITH DIFF
HCT: 25.3 % — ABNORMAL LOW (ref 38.9–52.0)
HGB: 7.8 g/dL — ABNORMAL LOW (ref 13.4–17.5)
MCH: 26.8 pg (ref 26.0–32.0)
MCHC: 30.8 g/dL — ABNORMAL LOW (ref 31.0–35.5)
MCV: 86.9 fL (ref 78.0–100.0)
PLATELETS: 413 10*3/uL — ABNORMAL HIGH (ref 150–400)
RBC: 2.91 10*6/uL — ABNORMAL LOW (ref 4.50–6.10)
RDW-CV: 17.3 % — ABNORMAL HIGH (ref 11.5–15.5)
WBC: 12.4 10*3/uL — ABNORMAL HIGH (ref 3.7–11.0)

## 2021-05-29 LAB — CBC/DIFF - CLIENT CONSOLIDATED
BASOPHIL #: 0.12 10*3/uL (ref <=0.20)
BASOPHIL %: 1 %
EOSINOPHIL #: 0.37 10*3/uL (ref <=0.50)
EOSINOPHIL %: 3 %
HCT: 25.3 % — ABNORMAL LOW (ref 38.9–52.0)
HGB: 7.8 g/dL — ABNORMAL LOW (ref 13.4–17.5)
LYMPHOCYTE #: 7.07 10*3/uL — ABNORMAL HIGH (ref 1.00–4.80)
LYMPHOCYTE %: 57 %
MCH: 26.8 pg (ref 26.0–32.0)
MCHC: 30.8 g/dL — ABNORMAL LOW (ref 31.0–35.5)
MCV: 86.9 fL (ref 78.0–100.0)
MONOCYTE #: 0.99 10*3/uL (ref 0.20–1.10)
MONOCYTE %: 8 %
NEUTROPHIL #: 3.97 10*3/uL (ref 1.50–7.70)
NEUTROPHIL %: 32 %
NRBC FROM MANUAL DIFF: 1 per 100 WBC
PLATELETS: 413 10*3/uL — ABNORMAL HIGH (ref 150–400)
RBC: 2.91 10*6/uL — ABNORMAL LOW (ref 4.50–6.10)
RDW-CV: 17.3 % — ABNORMAL HIGH (ref 11.5–15.5)
WBC: 12.4 10*3/uL — ABNORMAL HIGH (ref 3.7–11.0)

## 2021-05-29 LAB — MANUAL DIFF AND MORPHOLOGY-SYSMEX
BASOPHIL #: 0.12 10*3/uL (ref <=0.20)
BASOPHIL %: 1 %
EOSINOPHIL #: 0.37 10*3/uL (ref <=0.50)
EOSINOPHIL %: 3 %
LYMPHOCYTE #: 7.07 10*3/uL — ABNORMAL HIGH (ref 1.00–4.80)
LYMPHOCYTE %: 57 %
MONOCYTE #: 0.99 10*3/uL (ref 0.20–1.10)
MONOCYTE %: 8 %
NEUTROPHIL #: 3.97 10*3/uL (ref 1.50–7.70)
NEUTROPHIL %: 32 %
NRBC FROM MANUAL DIFF: 1 per 100 WBC

## 2021-05-29 LAB — COMPREHENSIVE METABOLIC PANEL, NON-FASTING
ALBUMIN: 2.4 g/dL — ABNORMAL LOW (ref 3.4–4.8)
ALKALINE PHOSPHATASE: 94 U/L (ref 45–115)
ALT (SGPT): 17 U/L (ref 10–55)
ANION GAP: 8 mmol/L (ref 4–13)
AST (SGOT): 15 U/L (ref 8–45)
BILIRUBIN TOTAL: 0.3 mg/dL (ref 0.3–1.3)
BUN/CREA RATIO: 40 — ABNORMAL HIGH (ref 6–22)
BUN: 34 mg/dL — ABNORMAL HIGH (ref 8–25)
CALCIUM: 9 mg/dL (ref 8.8–10.2)
CHLORIDE: 105 mmol/L (ref 96–111)
CO2 TOTAL: 25 mmol/L (ref 23–31)
CREATININE: 0.85 mg/dL (ref 0.75–1.35)
ESTIMATED GFR: >90 mL/min/BSA (ref >=60)
GLUCOSE: 226 mg/dL — ABNORMAL HIGH (ref 65–125)
POTASSIUM: 4.2 mmol/L (ref 3.5–5.1)
PROTEIN TOTAL: 5.4 g/dL — ABNORMAL LOW (ref 5.6–7.6)
SODIUM: 138 mmol/L (ref 136–145)

## 2021-05-29 LAB — PREALBUMIN: PREALBUMIN: 16.5 mg/dL — ABNORMAL LOW (ref 18.0–45.0)

## 2021-05-29 LAB — LIGHT GREEN TOP TUBE

## 2021-05-29 LAB — MAGNESIUM: MAGNESIUM: 2.2 mg/dL (ref 1.8–2.6)

## 2021-05-31 ENCOUNTER — Ambulatory Visit (INDEPENDENT_AMBULATORY_CARE_PROVIDER_SITE_OTHER): Payer: Self-pay | Admitting: SURGICAL ONCOLOGY

## 2021-05-31 ENCOUNTER — Other Ambulatory Visit: Payer: 59 | Attending: Internal Medicine | Admitting: Internal Medicine

## 2021-05-31 ENCOUNTER — Encounter (INDEPENDENT_AMBULATORY_CARE_PROVIDER_SITE_OTHER): Payer: Self-pay | Admitting: Student in an Organized Health Care Education/Training Program

## 2021-05-31 LAB — BASIC METABOLIC PANEL
ANION GAP: 8 mmol/L (ref 4–13)
BUN/CREA RATIO: 38 — ABNORMAL HIGH (ref 6–22)
BUN: 31 mg/dL — ABNORMAL HIGH (ref 8–25)
CALCIUM: 8.9 mg/dL (ref 8.8–10.2)
CHLORIDE: 105 mmol/L (ref 96–111)
CO2 TOTAL: 26 mmol/L (ref 23–31)
CREATININE: 0.82 mg/dL (ref 0.75–1.35)
ESTIMATED GFR: >90 mL/min/BSA (ref >=60)
GLUCOSE: 159 mg/dL — ABNORMAL HIGH (ref 65–125)
POTASSIUM: 4.5 mmol/L (ref 3.5–5.1)
SODIUM: 139 mmol/L (ref 136–145)

## 2021-05-31 LAB — CBC WITH DIFF
HCT: 23.3 % — ABNORMAL LOW (ref 38.9–52.0)
HGB: 7.2 g/dL — ABNORMAL LOW (ref 13.4–17.5)
MCH: 26.4 pg (ref 26.0–32.0)
MCHC: 30.9 g/dL — ABNORMAL LOW (ref 31.0–35.5)
MCV: 85.3 fL (ref 78.0–100.0)
MPV: 13 fL — ABNORMAL HIGH (ref 8.7–12.5)
PLATELETS: 393 10*3/uL (ref 150–400)
RBC: 2.73 10*6/uL — ABNORMAL LOW (ref 4.50–6.10)
RDW-CV: 17.1 % — ABNORMAL HIGH (ref 11.5–15.5)
WBC: 14.3 10*3/uL — ABNORMAL HIGH (ref 3.7–11.0)

## 2021-05-31 LAB — MANUAL DIFF AND MORPHOLOGY-SYSMEX
BASOPHIL #: 0.14 10*3/uL (ref <=0.20)
BASOPHIL %: 1 %
EOSINOPHIL #: 0.57 10*3/uL — ABNORMAL HIGH (ref <=0.50)
EOSINOPHIL %: 4 %
LYMPHOCYTE #: 6.58 10*3/uL — ABNORMAL HIGH (ref 1.00–4.80)
LYMPHOCYTE %: 46 %
MONOCYTE #: 1 10*3/uL (ref 0.20–1.10)
MONOCYTE %: 7 %
NEUTROPHIL #: 6.15 10*3/uL (ref 1.50–7.70)
NEUTROPHIL %: 43 %

## 2021-05-31 LAB — CBC/DIFF - CLIENT CONSOLIDATED
BASOPHIL #: 0.14 10*3/uL (ref <=0.20)
BASOPHIL %: 1 %
EOSINOPHIL #: 0.57 10*3/uL — ABNORMAL HIGH (ref <=0.50)
EOSINOPHIL %: 4 %
HCT: 23.3 % — ABNORMAL LOW (ref 38.9–52.0)
HGB: 7.2 g/dL — ABNORMAL LOW (ref 13.4–17.5)
LYMPHOCYTE #: 6.58 10*3/uL — ABNORMAL HIGH (ref 1.00–4.80)
LYMPHOCYTE %: 46 %
MCH: 26.4 pg (ref 26.0–32.0)
MCHC: 30.9 g/dL — ABNORMAL LOW (ref 31.0–35.5)
MCV: 85.3 fL (ref 78.0–100.0)
MONOCYTE #: 1 10*3/uL (ref 0.20–1.10)
MONOCYTE %: 7 %
MPV: 13 fL — ABNORMAL HIGH (ref 8.7–12.5)
NEUTROPHIL #: 6.15 10*3/uL (ref 1.50–7.70)
NEUTROPHIL %: 43 %
PLATELETS: 393 10*3/uL (ref 150–400)
RBC: 2.73 10*6/uL — ABNORMAL LOW (ref 4.50–6.10)
RDW-CV: 17.1 % — ABNORMAL HIGH (ref 11.5–15.5)
WBC: 14.3 10*3/uL — ABNORMAL HIGH (ref 3.7–11.0)

## 2021-05-31 LAB — VANCOMYCIN, TROUGH: VANCOMYCIN TROUGH: 15.9 ug/mL (ref 10.0–20.0)

## 2021-05-31 NOTE — Progress Notes (Signed)
Outpatient Parenteral Antimicrobial Therapy (OPAT) Note    Indication: Intra-abdominal abscess with bacteremia   Antibiotic Regimen: Vancomycin q24h, meropenem 1g every 8 hours, fluconazole '400mg'$  PO daily   Duration: 3 weeks   End date: 06/10/21    Antibiotic Discharge Reconciliation Note     Discharge Date from [facility]: 05/28/2021  Discharged to: SNF     [place]: [facility] [phone]    Discussed with [facility] pharmacist. SNF with correct antibiotics, doses, and duration. Missing CRP, to be added by pharmacist.     Celedonio Miyamoto, PharmD, [place]  Infectious Diseases Clinical Pharmacist  Ext. [phone]

## 2021-06-01 ENCOUNTER — Other Ambulatory Visit: Payer: Self-pay

## 2021-06-01 ENCOUNTER — Encounter (INDEPENDENT_AMBULATORY_CARE_PROVIDER_SITE_OTHER): Payer: Self-pay | Admitting: Student in an Organized Health Care Education/Training Program

## 2021-06-01 ENCOUNTER — Other Ambulatory Visit (INDEPENDENT_AMBULATORY_CARE_PROVIDER_SITE_OTHER): Payer: 59 | Admitting: Medical

## 2021-06-01 ENCOUNTER — Other Ambulatory Visit (INDEPENDENT_AMBULATORY_CARE_PROVIDER_SITE_OTHER): Payer: 59 | Admitting: Internal Medicine

## 2021-06-01 LAB — CBC/DIFF - CLIENT CONSOLIDATED
BASOPHIL #: 0.13 10*3/uL (ref <=0.20)
BASOPHIL %: 1 %
EOSINOPHIL #: 0.9 10*3/uL — ABNORMAL HIGH (ref <=0.50)
EOSINOPHIL %: 7 %
HCT: 25.3 % — ABNORMAL LOW (ref 38.9–52.0)
HGB: 7.7 g/dL — ABNORMAL LOW (ref 13.4–17.5)
LYMPHOCYTE #: 5.55 10*3/uL — ABNORMAL HIGH (ref 1.00–4.80)
LYMPHOCYTE %: 43 %
MCH: 26 pg (ref 26.0–32.0)
MCHC: 30.4 g/dL — ABNORMAL LOW (ref 31.0–35.5)
MCV: 85.5 fL (ref 78.0–100.0)
MONOCYTE #: 0.77 10*3/uL (ref 0.20–1.10)
MONOCYTE %: 6 %
MPV: 12.8 fL — ABNORMAL HIGH (ref 8.7–12.5)
NEUTROPHIL #: 5.55 10*3/uL (ref 1.50–7.70)
NEUTROPHIL %: 43 %
PLATELETS: 402 10*3/uL — ABNORMAL HIGH (ref 150–400)
RBC: 2.96 10*6/uL — ABNORMAL LOW (ref 4.50–6.10)
RDW-CV: 17.2 % — ABNORMAL HIGH (ref 11.5–15.5)
WBC: 12.9 10*3/uL — ABNORMAL HIGH (ref 3.7–11.0)

## 2021-06-01 LAB — TRIGLYCERIDE: TRIGLYCERIDES: 166 mg/dL — ABNORMAL HIGH (ref <150)

## 2021-06-01 LAB — MANUAL DIFF AND MORPHOLOGY-SYSMEX
BASOPHIL #: 0.13 10*3/uL (ref <=0.20)
BASOPHIL %: 1 %
EOSINOPHIL #: 0.9 10*3/uL — ABNORMAL HIGH (ref <=0.50)
EOSINOPHIL %: 7 %
LYMPHOCYTE #: 5.55 10*3/uL — ABNORMAL HIGH (ref 1.00–4.80)
LYMPHOCYTE %: 43 %
MONOCYTE #: 0.77 10*3/uL (ref 0.20–1.10)
MONOCYTE %: 6 %
NEUTROPHIL #: 5.55 10*3/uL (ref 1.50–7.70)
NEUTROPHIL %: 43 %

## 2021-06-01 LAB — CBC WITH DIFF
HCT: 25.3 % — ABNORMAL LOW (ref 38.9–52.0)
HGB: 7.7 g/dL — ABNORMAL LOW (ref 13.4–17.5)
MCH: 26 pg (ref 26.0–32.0)
MCHC: 30.4 g/dL — ABNORMAL LOW (ref 31.0–35.5)
MCV: 85.5 fL (ref 78.0–100.0)
MPV: 12.8 fL — ABNORMAL HIGH (ref 8.7–12.5)
PLATELETS: 402 10*3/uL — ABNORMAL HIGH (ref 150–400)
RBC: 2.96 10*6/uL — ABNORMAL LOW (ref 4.50–6.10)
RDW-CV: 17.2 % — ABNORMAL HIGH (ref 11.5–15.5)
WBC: 12.9 10*3/uL — ABNORMAL HIGH (ref 3.7–11.0)

## 2021-06-01 LAB — PHOSPHORUS: PHOSPHORUS: 3.5 mg/dL (ref 2.3–4.0)

## 2021-06-01 LAB — MAGNESIUM: MAGNESIUM: 2 mg/dL (ref 1.8–2.6)

## 2021-06-01 NOTE — Progress Notes (Signed)
Outpatient Parenteral Antimicrobial Therapy (OPAT) Note    OPAT Vancomycin Note   Indication: Intra-abdominal abscess with bacteremia   Antibiotic Regimen: Vancomycin q24h, meropenem 1g every 8 hours, fluconazole '400mg'$  PO daily   Duration: 3 weeks   End date: 06/10/21     Skilled Nursing Facility: [facility] [phone]     Date RPh SCr  BUN Level Plan Comments    4/29  AES 0.82 31 15.9 Continue vancomycin '1250mg'$  every 24 hours  Time vancomycin is dosed: 0700  Time of trough draw: 0442    Discussed with pharmacist Larene Beach - confirmed that trough was drawn on 4/29, not 5/1.   Patient was dosed with '1250mg'$  (first dose of this regimen) on 4/28 at 0639 while still in the hospital. Trough drawn indicative of ~22 hour level.                                                  [name], PharmD, [place]  Infectious Diseases Clinical Pharmacist  Ext. [phone]

## 2021-06-02 ENCOUNTER — Encounter (INDEPENDENT_AMBULATORY_CARE_PROVIDER_SITE_OTHER): Payer: Self-pay | Admitting: Student in an Organized Health Care Education/Training Program

## 2021-06-02 ENCOUNTER — Encounter (INDEPENDENT_AMBULATORY_CARE_PROVIDER_SITE_OTHER): Payer: Self-pay | Admitting: Gastroenterology

## 2021-06-02 DIAGNOSIS — D490 Neoplasm of unspecified behavior of digestive system: Secondary | ICD-10-CM

## 2021-06-02 LAB — C-REACTIVE PROTEIN(CRP),INFLAMMATION: CRP INFLAMMATION: 100.2 mg/L — ABNORMAL HIGH (ref <8.0)

## 2021-06-02 NOTE — Progress Notes (Signed)
[name]  06/02/2021    OPAT Lab Monitoring Note    I have reviewed the patient's labs from 05/31/21 available in EMR pertaining to antimicrobial monitoring.   They are stable when compared to previous values.  Please see PharmD note regarding Vanc trough interpretation and dosing.   We will continue with the current course of treatment.       Clovis Pu MD  Section of Infectious Diseases  06/02/2021

## 2021-06-03 ENCOUNTER — Other Ambulatory Visit (HOSPITAL_COMMUNITY): Payer: Self-pay

## 2021-06-03 ENCOUNTER — Encounter (INDEPENDENT_AMBULATORY_CARE_PROVIDER_SITE_OTHER): Payer: Self-pay

## 2021-06-03 ENCOUNTER — Other Ambulatory Visit (INDEPENDENT_AMBULATORY_CARE_PROVIDER_SITE_OTHER): Payer: 59 | Admitting: Internal Medicine

## 2021-06-03 LAB — VANCOMYCIN, TROUGH: VANCOMYCIN TROUGH: 13.7 ug/mL (ref 10.0–20.0)

## 2021-06-04 ENCOUNTER — Encounter (INDEPENDENT_AMBULATORY_CARE_PROVIDER_SITE_OTHER): Payer: Self-pay | Admitting: Student in an Organized Health Care Education/Training Program

## 2021-06-04 ENCOUNTER — Other Ambulatory Visit: Payer: Medicare Other | Attending: NURSE PRACTITIONER | Admitting: NURSE PRACTITIONER

## 2021-06-04 LAB — CBC/DIFF - CLIENT CONSOLIDATED
BASOPHIL #: 0.15 10*3/uL (ref <=0.20)
BASOPHIL %: 1 %
EOSINOPHIL #: 0.29 10*3/uL (ref <=0.50)
EOSINOPHIL %: 2 %
HCT: 27.7 % — ABNORMAL LOW (ref 38.9–52.0)
HGB: 8.5 g/dL — ABNORMAL LOW (ref 13.4–17.5)
LYMPHOCYTE #: 6.96 10*3/uL — ABNORMAL HIGH (ref 1.00–4.80)
LYMPHOCYTE %: 48 %
MCH: 26.1 pg (ref 26.0–32.0)
MCHC: 30.7 g/dL — ABNORMAL LOW (ref 31.0–35.5)
MCV: 85 fL (ref 78.0–100.0)
MONOCYTE #: 0.87 10*3/uL (ref 0.20–1.10)
MONOCYTE %: 6 %
MPV: 12 fL (ref 8.7–12.5)
NEUTROPHIL #: 6.24 10*3/uL (ref 1.50–7.70)
NEUTROPHIL %: 43 %
PLATELETS: 505 10*3/uL — ABNORMAL HIGH (ref 150–400)
RBC MORPHOLOGY: NORMAL
RBC: 3.26 10*6/uL — ABNORMAL LOW (ref 4.50–6.10)
RDW-CV: 17.5 % — ABNORMAL HIGH (ref 11.5–15.5)
WBC: 14.5 10*3/uL — ABNORMAL HIGH (ref 3.7–11.0)

## 2021-06-04 LAB — CBC WITH DIFF
HCT: 27.7 % — ABNORMAL LOW (ref 38.9–52.0)
HGB: 8.5 g/dL — ABNORMAL LOW (ref 13.4–17.5)
MCH: 26.1 pg (ref 26.0–32.0)
MCHC: 30.7 g/dL — ABNORMAL LOW (ref 31.0–35.5)
MCV: 85 fL (ref 78.0–100.0)
MPV: 12 fL (ref 8.7–12.5)
PLATELETS: 505 10*3/uL — ABNORMAL HIGH (ref 150–400)
RBC: 3.26 10*6/uL — ABNORMAL LOW (ref 4.50–6.10)
RDW-CV: 17.5 % — ABNORMAL HIGH (ref 11.5–15.5)
WBC: 14.5 10*3/uL — ABNORMAL HIGH (ref 3.7–11.0)

## 2021-06-04 LAB — BASIC METABOLIC PANEL
ANION GAP: 8 mmol/L (ref 4–13)
BUN/CREA RATIO: 39 — ABNORMAL HIGH (ref 6–22)
BUN: 31 mg/dL — ABNORMAL HIGH (ref 8–25)
CALCIUM: 9.5 mg/dL (ref 8.8–10.2)
CHLORIDE: 104 mmol/L (ref 96–111)
CO2 TOTAL: 29 mmol/L (ref 23–31)
CREATININE: 0.8 mg/dL (ref 0.75–1.35)
ESTIMATED GFR: >90 mL/min/BSA (ref >=60)
GLUCOSE: 216 mg/dL — ABNORMAL HIGH (ref 65–125)
POTASSIUM: 3.7 mmol/L (ref 3.5–5.1)
SODIUM: 141 mmol/L (ref 136–145)

## 2021-06-04 LAB — MANUAL DIFF AND MORPHOLOGY-SYSMEX
BASOPHIL #: 0.15 10*3/uL (ref <=0.20)
BASOPHIL %: 1 %
EOSINOPHIL #: 0.29 10*3/uL (ref <=0.50)
EOSINOPHIL %: 2 %
LYMPHOCYTE #: 6.96 10*3/uL — ABNORMAL HIGH (ref 1.00–4.80)
LYMPHOCYTE %: 48 %
MONOCYTE #: 0.87 10*3/uL (ref 0.20–1.10)
MONOCYTE %: 6 %
NEUTROPHIL #: 6.24 10*3/uL (ref 1.50–7.70)
NEUTROPHIL %: 43 %
RBC MORPHOLOGY: NORMAL

## 2021-06-04 NOTE — Progress Notes (Signed)
Outpatient Parenteral Antimicrobial Therapy (OPAT) Note    OPAT Vancomycin Note   Indication: Intra-abdominal abscess with bacteremia   Antibiotic Regimen: Vancomycin q24h, meropenem 1g every 8 hours, fluconazole '400mg'$  PO daily   Duration: 3 weeks   End date: 06/10/21     Skilled Nursing Facility: [facility] [phone]     Date RPh SCr  BUN Level Plan Comments    4/29  AES 0.82 31 15.9 Continue vancomycin '1250mg'$  every 24 hours  Time vancomycin is dosed: 0700  Time of trough draw: 0442    Discussed with pharmacist Larene Beach - confirmed that trough was drawn on 4/29, not 5/1.   Patient was dosed with '1250mg'$  (first dose of this regimen) on 4/28 at 0639 while still in the hospital. Trough drawn indicative of ~22 hour level.   5/4 AES - - 13.7 Continue vancomycin '1250mg'$  every 24 hours  Time of trough draw: 0455    Early trough, expected true trough to be 10-15                                         Celedonio Miyamoto, PharmD, [place]  Infectious Diseases Clinical Pharmacist  Ext. [phone]

## 2021-06-05 ENCOUNTER — Other Ambulatory Visit (INDEPENDENT_AMBULATORY_CARE_PROVIDER_SITE_OTHER): Payer: Medicare Other | Admitting: NURSE PRACTITIONER

## 2021-06-05 LAB — MANUAL DIFF AND MORPHOLOGY-SYSMEX
BASOPHIL #: <0.1 10*3/uL (ref <=0.20)
BASOPHIL %: 0 %
EOSINOPHIL #: 0.5 10*3/uL (ref <=0.50)
EOSINOPHIL %: 3 %
LYMPHOCYTE #: 8.63 10*3/uL — ABNORMAL HIGH (ref 1.00–4.80)
LYMPHOCYTE %: 52 %
MONOCYTE #: 0.83 10*3/uL (ref 0.20–1.10)
MONOCYTE %: 5 %
MYELOCYTE %: 1 %
NEUTROPHIL #: 6.47 10*3/uL (ref 1.50–7.70)
NEUTROPHIL %: 39 %

## 2021-06-05 LAB — CBC/DIFF - CLIENT CONSOLIDATED
BASOPHIL #: <0.1 10*3/uL (ref <=0.20)
BASOPHIL %: 0 %
EOSINOPHIL #: 0.5 10*3/uL (ref <=0.50)
EOSINOPHIL %: 3 %
HCT: 26.8 % — ABNORMAL LOW (ref 38.9–52.0)
HGB: 8.2 g/dL — ABNORMAL LOW (ref 13.4–17.5)
LYMPHOCYTE #: 8.63 10*3/uL — ABNORMAL HIGH (ref 1.00–4.80)
LYMPHOCYTE %: 52 %
MCH: 26.1 pg (ref 26.0–32.0)
MCHC: 30.6 g/dL — ABNORMAL LOW (ref 31.0–35.5)
MCV: 85.4 fL (ref 78.0–100.0)
MONOCYTE #: 0.83 10*3/uL (ref 0.20–1.10)
MONOCYTE %: 5 %
MYELOCYTE %: 1 %
NEUTROPHIL #: 6.47 10*3/uL (ref 1.50–7.70)
NEUTROPHIL %: 39 %
PLATELETS: 385 10*3/uL (ref 150–400)
RBC: 3.14 10*6/uL — ABNORMAL LOW (ref 4.50–6.10)
RDW-CV: 17.6 % — ABNORMAL HIGH (ref 11.5–15.5)
WBC: 16.6 10*3/uL — ABNORMAL HIGH (ref 3.7–11.0)

## 2021-06-05 LAB — CBC WITH DIFF
HCT: 26.8 % — ABNORMAL LOW (ref 38.9–52.0)
HGB: 8.2 g/dL — ABNORMAL LOW (ref 13.4–17.5)
MCH: 26.1 pg (ref 26.0–32.0)
MCHC: 30.6 g/dL — ABNORMAL LOW (ref 31.0–35.5)
MCV: 85.4 fL (ref 78.0–100.0)
PLATELETS: 385 10*3/uL (ref 150–400)
RBC: 3.14 10*6/uL — ABNORMAL LOW (ref 4.50–6.10)
RDW-CV: 17.6 % — ABNORMAL HIGH (ref 11.5–15.5)
WBC: 16.6 10*3/uL — ABNORMAL HIGH (ref 3.7–11.0)

## 2021-06-05 LAB — BASIC METABOLIC PANEL
ANION GAP: 9 mmol/L (ref 4–13)
BUN/CREA RATIO: 29 — ABNORMAL HIGH (ref 6–22)
BUN: 25 mg/dL (ref 8–25)
CALCIUM: 9.4 mg/dL (ref 8.8–10.2)
CHLORIDE: 105 mmol/L (ref 96–111)
CO2 TOTAL: 24 mmol/L (ref 23–31)
CREATININE: 0.85 mg/dL (ref 0.75–1.35)
ESTIMATED GFR: >90 mL/min/BSA (ref >=60)
GLUCOSE: 210 mg/dL — ABNORMAL HIGH (ref 65–125)
POTASSIUM: 5.1 mmol/L (ref 3.5–5.1)
SODIUM: 138 mmol/L (ref 136–145)

## 2021-06-06 ENCOUNTER — Other Ambulatory Visit: Payer: Medicare Other | Attending: NURSE PRACTITIONER | Admitting: NURSE PRACTITIONER

## 2021-06-06 LAB — BASIC METABOLIC PANEL
ANION GAP: 7 mmol/L (ref 4–13)
BUN/CREA RATIO: 32 — ABNORMAL HIGH (ref 6–22)
BUN: 24 mg/dL (ref 8–25)
CALCIUM: 9.3 mg/dL (ref 8.8–10.2)
CHLORIDE: 104 mmol/L (ref 96–111)
CO2 TOTAL: 26 mmol/L (ref 23–31)
CREATININE: 0.76 mg/dL (ref 0.75–1.35)
ESTIMATED GFR: >90 mL/min/BSA (ref >=60)
GLUCOSE: 154 mg/dL — ABNORMAL HIGH (ref 65–125)
POTASSIUM: 3.4 mmol/L — ABNORMAL LOW (ref 3.5–5.1)
SODIUM: 137 mmol/L (ref 136–145)

## 2021-06-06 LAB — CBC/DIFF - CLIENT CONSOLIDATED
BASOPHIL #: <0.1 10*3/uL (ref <=0.20)
BASOPHIL %: 0 %
EOSINOPHIL #: 0.21 10*3/uL (ref <=0.50)
EOSINOPHIL %: 1 %
HCT: 26.4 % — ABNORMAL LOW (ref 38.9–52.0)
HGB: 8.2 g/dL — ABNORMAL LOW (ref 13.4–17.5)
LYMPHOCYTE #: 11.59 10*3/uL — ABNORMAL HIGH (ref 1.00–4.80)
LYMPHOCYTE %: 56 %
MCH: 25.9 pg — ABNORMAL LOW (ref 26.0–32.0)
MCHC: 31.1 g/dL (ref 31.0–35.5)
MCV: 83.5 fL (ref 78.0–100.0)
MONOCYTE #: 0.62 10*3/uL (ref 0.20–1.10)
MONOCYTE %: 3 %
MPV: 12.6 fL — ABNORMAL HIGH (ref 8.7–12.5)
NEUTROPHIL #: 8.49 10*3/uL — ABNORMAL HIGH (ref 1.50–7.70)
NEUTROPHIL %: 41 %
PLATELETS: 518 10*3/uL — ABNORMAL HIGH (ref 150–400)
RBC: 3.16 10*6/uL — ABNORMAL LOW (ref 4.50–6.10)
RDW-CV: 17.4 % — ABNORMAL HIGH (ref 11.5–15.5)
WBC: 20.7 10*3/uL — ABNORMAL HIGH (ref 3.7–11.0)

## 2021-06-06 LAB — MANUAL DIFF AND MORPHOLOGY-SYSMEX
BASOPHIL #: <0.1 10*3/uL (ref <=0.20)
BASOPHIL %: 0 %
EOSINOPHIL #: 0.21 10*3/uL (ref <=0.50)
EOSINOPHIL %: 1 %
LYMPHOCYTE #: 11.59 10*3/uL — ABNORMAL HIGH (ref 1.00–4.80)
LYMPHOCYTE %: 56 %
MONOCYTE #: 0.62 10*3/uL (ref 0.20–1.10)
MONOCYTE %: 3 %
NEUTROPHIL #: 8.49 10*3/uL — ABNORMAL HIGH (ref 1.50–7.70)
NEUTROPHIL %: 41 %

## 2021-06-06 LAB — CBC WITH DIFF
HCT: 26.4 % — ABNORMAL LOW (ref 38.9–52.0)
HGB: 8.2 g/dL — ABNORMAL LOW (ref 13.4–17.5)
MCH: 25.9 pg — ABNORMAL LOW (ref 26.0–32.0)
MCHC: 31.1 g/dL (ref 31.0–35.5)
MCV: 83.5 fL (ref 78.0–100.0)
MPV: 12.6 fL — ABNORMAL HIGH (ref 8.7–12.5)
PLATELETS: 518 10*3/uL — ABNORMAL HIGH (ref 150–400)
RBC: 3.16 10*6/uL — ABNORMAL LOW (ref 4.50–6.10)
RDW-CV: 17.4 % — ABNORMAL HIGH (ref 11.5–15.5)
WBC: 20.7 10*3/uL — ABNORMAL HIGH (ref 3.7–11.0)

## 2021-06-07 ENCOUNTER — Other Ambulatory Visit: Payer: Medicare Other | Attending: Internal Medicine | Admitting: Internal Medicine

## 2021-06-07 ENCOUNTER — Telehealth (INDEPENDENT_AMBULATORY_CARE_PROVIDER_SITE_OTHER): Payer: Self-pay

## 2021-06-07 LAB — MANUAL DIFF AND MORPHOLOGY-SYSMEX
BASOPHIL #: 0.12 10*3/uL (ref <=0.20)
BASOPHIL %: 1 %
EOSINOPHIL #: 0.12 10*3/uL (ref <=0.50)
EOSINOPHIL %: 1 %
LYMPHOCYTE #: 5.64 10*3/uL — ABNORMAL HIGH (ref 1.00–4.80)
LYMPHOCYTE %: 47 %
MONOCYTE #: 0.84 10*3/uL (ref 0.20–1.10)
MONOCYTE %: 7 %
NEUTROPHIL #: 5.4 10*3/uL (ref 1.50–7.70)
NEUTROPHIL %: 45 %
NRBC FROM MANUAL DIFF: 1 per 100 WBC

## 2021-06-07 LAB — BASIC METABOLIC PANEL
ANION GAP: 8 mmol/L (ref 4–13)
BUN/CREA RATIO: 24 — ABNORMAL HIGH (ref 6–22)
BUN: 21 mg/dL (ref 8–25)
CALCIUM: 9.5 mg/dL (ref 8.8–10.2)
CHLORIDE: 104 mmol/L (ref 96–111)
CO2 TOTAL: 24 mmol/L (ref 23–31)
CREATININE: 0.87 mg/dL (ref 0.75–1.35)
ESTIMATED GFR: 90 mL/min/BSA (ref >=60)
GLUCOSE: 187 mg/dL — ABNORMAL HIGH (ref 65–125)
POTASSIUM: 3.9 mmol/L (ref 3.5–5.1)
SODIUM: 136 mmol/L (ref 136–145)

## 2021-06-07 LAB — CBC WITH DIFF
HCT: 28 % — ABNORMAL LOW (ref 38.9–52.0)
HGB: 8.3 g/dL — ABNORMAL LOW (ref 13.4–17.5)
MCH: 25.6 pg — ABNORMAL LOW (ref 26.0–32.0)
MCHC: 29.6 g/dL — ABNORMAL LOW (ref 31.0–35.5)
MCV: 86.4 fL (ref 78.0–100.0)
MPV: 13 fL — ABNORMAL HIGH (ref 8.7–12.5)
PLATELETS: 495 10*3/uL — ABNORMAL HIGH (ref 150–400)
RBC: 3.24 10*6/uL — ABNORMAL LOW (ref 4.50–6.10)
RDW-CV: 17.7 % — ABNORMAL HIGH (ref 11.5–15.5)
WBC: 12 10*3/uL — ABNORMAL HIGH (ref 3.7–11.0)

## 2021-06-07 LAB — CBC/DIFF - CLIENT CONSOLIDATED
BASOPHIL #: 0.12 10*3/uL (ref <=0.20)
BASOPHIL %: 1 %
EOSINOPHIL #: 0.12 10*3/uL (ref <=0.50)
EOSINOPHIL %: 1 %
HCT: 28 % — ABNORMAL LOW (ref 38.9–52.0)
HGB: 8.3 g/dL — ABNORMAL LOW (ref 13.4–17.5)
LYMPHOCYTE #: 5.64 10*3/uL — ABNORMAL HIGH (ref 1.00–4.80)
LYMPHOCYTE %: 47 %
MCH: 25.6 pg — ABNORMAL LOW (ref 26.0–32.0)
MCHC: 29.6 g/dL — ABNORMAL LOW (ref 31.0–35.5)
MCV: 86.4 fL (ref 78.0–100.0)
MONOCYTE #: 0.84 10*3/uL (ref 0.20–1.10)
MONOCYTE %: 7 %
MPV: 13 fL — ABNORMAL HIGH (ref 8.7–12.5)
NEUTROPHIL #: 5.4 10*3/uL (ref 1.50–7.70)
NEUTROPHIL %: 45 %
NRBC FROM MANUAL DIFF: 1 per 100 WBC
PLATELETS: 495 10*3/uL — ABNORMAL HIGH (ref 150–400)
RBC: 3.24 10*6/uL — ABNORMAL LOW (ref 4.50–6.10)
RDW-CV: 17.7 % — ABNORMAL HIGH (ref 11.5–15.5)
WBC: 12 10*3/uL — ABNORMAL HIGH (ref 3.7–11.0)

## 2021-06-07 LAB — VANCOMYCIN, TROUGH: VANCOMYCIN TROUGH: 14.6 ug/mL (ref 10.0–20.0)

## 2021-06-07 LAB — C-REACTIVE PROTEIN (CRP): CRP INFLAMMATION: 32.2 mg/L — ABNORMAL HIGH (ref <8.0)

## 2021-06-07 LAB — C-REACTIVE PROTEIN(CRP),INFLAMMATION: CRP INFLAMMATION: 31.6 mg/L — ABNORMAL HIGH (ref <8.0)

## 2021-06-07 NOTE — Telephone Encounter (Signed)
Called patient to discuss the CT of his abdomen that was previously scheduled for 06/03/21. He said that it was cancelled in error. I advised him that we need this CT prior to his procedure with Dr. Theodora Blow on 07/06/21 where he will remove the stent. He was agreeable to rescheduling CT. I reached out to our imaging department to get this CT rescheduled. He was agreeable. Will track CT once we have a date.  Roselie Skinner, FNP-C  06/07/2021, 10:18

## 2021-06-08 ENCOUNTER — Encounter (INDEPENDENT_AMBULATORY_CARE_PROVIDER_SITE_OTHER): Payer: Self-pay | Admitting: Student in an Organized Health Care Education/Training Program

## 2021-06-08 ENCOUNTER — Other Ambulatory Visit (INDEPENDENT_AMBULATORY_CARE_PROVIDER_SITE_OTHER): Payer: Medicare Other | Admitting: NURSE PRACTITIONER

## 2021-06-08 LAB — MANUAL DIFF AND MORPHOLOGY-SYSMEX
BASOPHIL #: <0.1 10*3/uL (ref <=0.20)
BASOPHIL %: 0 %
EOSINOPHIL #: 0.14 10*3/uL (ref <=0.50)
EOSINOPHIL %: 1 %
LYMPHOCYTE #: 7.37 10*3/uL — ABNORMAL HIGH (ref 1.00–4.80)
LYMPHOCYTE %: 53 %
MONOCYTE #: 0.56 10*3/uL (ref 0.20–1.10)
MONOCYTE %: 4 %
NEUTROPHIL #: 5.84 10*3/uL (ref 1.50–7.70)
NEUTROPHIL %: 42 %

## 2021-06-08 LAB — CBC/DIFF - CLIENT CONSOLIDATED
BASOPHIL #: <0.1 10*3/uL (ref <=0.20)
BASOPHIL %: 0 %
EOSINOPHIL #: 0.14 10*3/uL (ref <=0.50)
EOSINOPHIL %: 1 %
HCT: 25.3 % — ABNORMAL LOW (ref 38.9–52.0)
HGB: 7.9 g/dL — ABNORMAL LOW (ref 13.4–17.5)
LYMPHOCYTE #: 7.37 10*3/uL — ABNORMAL HIGH (ref 1.00–4.80)
LYMPHOCYTE %: 53 %
MCH: 26.2 pg (ref 26.0–32.0)
MCHC: 31.2 g/dL (ref 31.0–35.5)
MCV: 83.8 fL (ref 78.0–100.0)
MONOCYTE #: 0.56 10*3/uL (ref 0.20–1.10)
MONOCYTE %: 4 %
MPV: 12.1 fL (ref 8.7–12.5)
NEUTROPHIL #: 5.84 10*3/uL (ref 1.50–7.70)
NEUTROPHIL %: 42 %
PLATELETS: 525 10*3/uL — ABNORMAL HIGH (ref 150–400)
RBC: 3.02 10*6/uL — ABNORMAL LOW (ref 4.50–6.10)
RDW-CV: 17.4 % — ABNORMAL HIGH (ref 11.5–15.5)
WBC: 13.9 10*3/uL — ABNORMAL HIGH (ref 3.7–11.0)

## 2021-06-08 LAB — CBC WITH DIFF
HCT: 25.3 % — ABNORMAL LOW (ref 38.9–52.0)
HGB: 7.9 g/dL — ABNORMAL LOW (ref 13.4–17.5)
MCH: 26.2 pg (ref 26.0–32.0)
MCHC: 31.2 g/dL (ref 31.0–35.5)
MCV: 83.8 fL (ref 78.0–100.0)
MPV: 12.1 fL (ref 8.7–12.5)
PLATELETS: 525 10*3/uL — ABNORMAL HIGH (ref 150–400)
RBC: 3.02 10*6/uL — ABNORMAL LOW (ref 4.50–6.10)
RDW-CV: 17.4 % — ABNORMAL HIGH (ref 11.5–15.5)
WBC: 13.9 10*3/uL — ABNORMAL HIGH (ref 3.7–11.0)

## 2021-06-08 LAB — BASIC METABOLIC PANEL
ANION GAP: 9 mmol/L (ref 4–13)
BUN/CREA RATIO: 29 — ABNORMAL HIGH (ref 6–22)
BUN: 23 mg/dL (ref 8–25)
CALCIUM: 9.2 mg/dL (ref 8.8–10.2)
CHLORIDE: 107 mmol/L (ref 96–111)
CO2 TOTAL: 25 mmol/L (ref 23–31)
CREATININE: 0.79 mg/dL (ref 0.75–1.35)
ESTIMATED GFR: >90 mL/min/BSA (ref >=60)
GLUCOSE: 190 mg/dL — ABNORMAL HIGH (ref 65–125)
POTASSIUM: 3.3 mmol/L — ABNORMAL LOW (ref 3.5–5.1)
SODIUM: 141 mmol/L (ref 136–145)

## 2021-06-08 NOTE — Care Management Notes (Signed)
Referral Information  ++++++ Placed Provider #1 ++++++  Case Manager: Jonathan Hoyle  Provider Type: Acute -Rehab  Provider Name: [facility]  Address:  [address]  [place], [place] [address]  Contact: Christy Johnson    Phone: [phone] x  Fax:   Fax: [phone]

## 2021-06-08 NOTE — Progress Notes (Signed)
[name]  06/08/2021    OPAT Lab Monitoring Note    I have reviewed the patient's OPAT labs.   They are stable when compared to previous values.  Please see PharmD note regarding Vanc trough interpretation and dosing.   ID f/u in very near future.     We will continue with the current course of treatment.       Clovis Pu MD  Section of Infectious Diseases  06/08/2021

## 2021-06-08 NOTE — Progress Notes (Signed)
Outpatient Parenteral Antimicrobial Therapy (OPAT) Note    OPAT Vancomycin Note   Indication: Intra-abdominal abscess with bacteremia   Antibiotic Regimen: Vancomycin q24h, meropenem 1g every 8 hours, fluconazole '400mg'$  PO daily   Duration: 3 weeks   End date: 06/10/21     Skilled Nursing Facility: [facility] [phone]     Date RPh SCr  BUN Level Plan Comments    4/29  AES 0.82 31 15.9 Continue vancomycin '1250mg'$  every 24 hours  Time vancomycin is dosed: 0700  Time of trough draw: 0442    Discussed with pharmacist Larene Beach - confirmed that trough was drawn on 4/29, not 5/1.   Patient was dosed with '1250mg'$  (first dose of this regimen) on 4/28 at 0639 while still in the hospital. Trough drawn indicative of ~22 hour level.   5/4 AES - - 13.7 Continue vancomycin '1250mg'$  every 24 hours  Time of trough draw: 0455    Early trough, expected true trough to be 10-15   5/8 AES 0.87 21 14.6 Continue vancomycin '1250mg'$  every 24 hours  Slightly early trough, expected true trough 10-15.                                 Celedonio Miyamoto, PharmD, [place]  Infectious Diseases Clinical Pharmacist  Ext. [phone]

## 2021-06-09 ENCOUNTER — Encounter (INDEPENDENT_AMBULATORY_CARE_PROVIDER_SITE_OTHER): Payer: Self-pay | Admitting: Student in an Organized Health Care Education/Training Program

## 2021-06-09 ENCOUNTER — Other Ambulatory Visit: Payer: Medicare Other | Attending: NURSE PRACTITIONER | Admitting: NURSE PRACTITIONER

## 2021-06-09 ENCOUNTER — Other Ambulatory Visit: Payer: Self-pay

## 2021-06-09 ENCOUNTER — Ambulatory Visit (HOSPITAL_BASED_OUTPATIENT_CLINIC_OR_DEPARTMENT_OTHER): Payer: 59 | Admitting: Student in an Organized Health Care Education/Training Program

## 2021-06-09 ENCOUNTER — Ambulatory Visit: Payer: 59 | Attending: SURGICAL ONCOLOGY | Admitting: SURGICAL ONCOLOGY

## 2021-06-09 ENCOUNTER — Encounter (INDEPENDENT_AMBULATORY_CARE_PROVIDER_SITE_OTHER): Payer: Self-pay | Admitting: SURGICAL ONCOLOGY

## 2021-06-09 VITALS — BP 129/72 | HR 110 | Temp 97.8°F | Ht 70.0 in | Wt 163.6 lb

## 2021-06-09 VITALS — BP 129/72 | HR 118 | Temp 97.8°F | Ht 70.0 in | Wt 163.6 lb

## 2021-06-09 DIAGNOSIS — K91840 Postprocedural hemorrhage and hematoma of a digestive system organ or structure following a digestive system procedure: Secondary | ICD-10-CM

## 2021-06-09 DIAGNOSIS — R7982 Elevated C-reactive protein (CRP): Secondary | ICD-10-CM

## 2021-06-09 DIAGNOSIS — T8143XA Infection following a procedure, organ and space surgical site, initial encounter: Secondary | ICD-10-CM

## 2021-06-09 DIAGNOSIS — Z09 Encounter for follow-up examination after completed treatment for conditions other than malignant neoplasm: Secondary | ICD-10-CM

## 2021-06-09 DIAGNOSIS — K8689 Other specified diseases of pancreas: Secondary | ICD-10-CM | POA: Insufficient documentation

## 2021-06-09 DIAGNOSIS — C61 Malignant neoplasm of prostate: Secondary | ICD-10-CM

## 2021-06-09 LAB — BASIC METABOLIC PANEL
ANION GAP: 9 mmol/L (ref 4–13)
BUN/CREA RATIO: 34 — ABNORMAL HIGH (ref 6–22)
BUN: 25 mg/dL (ref 8–25)
CALCIUM: 9.1 mg/dL (ref 8.8–10.2)
CHLORIDE: 107 mmol/L (ref 96–111)
CO2 TOTAL: 24 mmol/L (ref 23–31)
CREATININE: 0.74 mg/dL — ABNORMAL LOW (ref 0.75–1.35)
ESTIMATED GFR: >90 mL/min/BSA (ref >=60)
GLUCOSE: 218 mg/dL — ABNORMAL HIGH (ref 65–125)
POTASSIUM: 3.2 mmol/L — ABNORMAL LOW (ref 3.5–5.1)
SODIUM: 140 mmol/L (ref 136–145)

## 2021-06-09 LAB — CBC WITH DIFF
HCT: 25.5 % — ABNORMAL LOW (ref 38.9–52.0)
HGB: 8 g/dL — ABNORMAL LOW (ref 13.4–17.5)
MCH: 26.1 pg (ref 26.0–32.0)
MCHC: 31.4 g/dL (ref 31.0–35.5)
MCV: 83.1 fL (ref 78.0–100.0)
MPV: 12.5 fL (ref 8.7–12.5)
PLATELETS: 528 10*3/uL — ABNORMAL HIGH (ref 150–400)
RBC: 3.07 10*6/uL — ABNORMAL LOW (ref 4.50–6.10)
RDW-CV: 17.5 % — ABNORMAL HIGH (ref 11.5–15.5)
WBC: 18.4 10*3/uL — ABNORMAL HIGH (ref 3.7–11.0)

## 2021-06-09 LAB — MANUAL DIFF AND MORPHOLOGY-SYSMEX
BASOPHIL #: 0.37 10*3/uL — ABNORMAL HIGH (ref <=0.20)
BASOPHIL %: 2 %
EOSINOPHIL #: <0.1 10*3/uL (ref <=0.50)
EOSINOPHIL %: 0 %
LYMPHOCYTE #: 8.83 10*3/uL — ABNORMAL HIGH (ref 1.00–4.80)
LYMPHOCYTE %: 48 %
MONOCYTE #: 0.55 10*3/uL (ref 0.20–1.10)
MONOCYTE %: 3 %
NEUTROPHIL #: 8.65 10*3/uL — ABNORMAL HIGH (ref 1.50–7.70)
NEUTROPHIL %: 47 %

## 2021-06-09 LAB — CBC/DIFF - CLIENT CONSOLIDATED
BASOPHIL #: 0.37 10*3/uL — ABNORMAL HIGH (ref <=0.20)
BASOPHIL %: 2 %
EOSINOPHIL #: <0.1 10*3/uL (ref <=0.50)
EOSINOPHIL %: 0 %
HCT: 25.5 % — ABNORMAL LOW (ref 38.9–52.0)
HGB: 8 g/dL — ABNORMAL LOW (ref 13.4–17.5)
LYMPHOCYTE #: 8.83 10*3/uL — ABNORMAL HIGH (ref 1.00–4.80)
LYMPHOCYTE %: 48 %
MCH: 26.1 pg (ref 26.0–32.0)
MCHC: 31.4 g/dL (ref 31.0–35.5)
MCV: 83.1 fL (ref 78.0–100.0)
MONOCYTE #: 0.55 10*3/uL (ref 0.20–1.10)
MONOCYTE %: 3 %
MPV: 12.5 fL (ref 8.7–12.5)
NEUTROPHIL #: 8.65 10*3/uL — ABNORMAL HIGH (ref 1.50–7.70)
NEUTROPHIL %: 47 %
PLATELETS: 528 10*3/uL — ABNORMAL HIGH (ref 150–400)
RBC: 3.07 10*6/uL — ABNORMAL LOW (ref 4.50–6.10)
RDW-CV: 17.5 % — ABNORMAL HIGH (ref 11.5–15.5)
WBC: 18.4 10*3/uL — ABNORMAL HIGH (ref 3.7–11.0)

## 2021-06-09 NOTE — Progress Notes (Signed)
Outpatient Parenteral Antimicrobial Therapy (OPAT) Note    OPAT Vancomycin Note   Indication: Intra-abdominal abscess with bacteremia   Antibiotic Regimen: Vancomycin q24h, meropenem 1g every 8 hours, fluconazole '400mg'$  PO daily   Duration: 3 weeks   End date: 06/17/21      Skilled Nursing Facility: [facility] [phone]     Date RPh SCr  BUN Level Plan Comments    4/29  AES 0.82 31 15.9 Continue vancomycin '1250mg'$  every 24 hours  Time vancomycin is dosed: 0700  Time of trough draw: 0442    Discussed with pharmacist Larene Beach - confirmed that trough was drawn on 4/29, not 5/1.   Patient was dosed with '1250mg'$  (first dose of this regimen) on 4/28 at 0639 while still in the hospital. Trough drawn indicative of ~22 hour level.   5/4 AES - - 13.7 Continue vancomycin '1250mg'$  every 24 hours  Time of trough draw: 0455    Early trough, expected true trough to be 10-15   5/8 AES 0.87 21 14.6 Continue vancomycin '1250mg'$  every 24 hours  Slightly early trough, expected true trough 10-15.    5/10 AES - - - - Therapy extended by Dr. Alfonzo Feller through 06/17/21. [facility] aware.                        Celedonio Miyamoto, PharmD, [place]  Infectious Diseases Clinical Pharmacist  Ext. [phone]

## 2021-06-10 ENCOUNTER — Other Ambulatory Visit (HOSPITAL_COMMUNITY): Payer: Self-pay | Admitting: Internal Medicine

## 2021-06-10 ENCOUNTER — Other Ambulatory Visit (INDEPENDENT_AMBULATORY_CARE_PROVIDER_SITE_OTHER): Payer: Medicare Other | Admitting: Internal Medicine

## 2021-06-10 DIAGNOSIS — Q8789 Other specified congenital malformation syndromes, not elsewhere classified: Secondary | ICD-10-CM

## 2021-06-10 LAB — CBC/DIFF - CLIENT CONSOLIDATED
BASOPHIL #: <0.1 10*3/uL (ref <=0.20)
BASOPHIL %: 0 %
EOSINOPHIL #: <0.1 10*3/uL (ref <=0.50)
EOSINOPHIL %: 0 %
HCT: 24.7 % — ABNORMAL LOW (ref 38.9–52.0)
HGB: 7.9 g/dL — ABNORMAL LOW (ref 13.4–17.5)
LYMPHOCYTE #: 7.73 10*3/uL — ABNORMAL HIGH (ref 1.00–4.80)
LYMPHOCYTE %: 48 %
MCH: 26.8 pg (ref 26.0–32.0)
MCHC: 32 g/dL (ref 31.0–35.5)
MCV: 83.7 fL (ref 78.0–100.0)
MONOCYTE #: 0.48 10*3/uL (ref 0.20–1.10)
MONOCYTE %: 3 %
MPV: 12.6 fL — ABNORMAL HIGH (ref 8.7–12.5)
NEUTROPHIL #: 8.05 10*3/uL — ABNORMAL HIGH (ref 1.50–7.70)
NEUTROPHIL %: 50 %
PLATELETS: 520 10*3/uL — ABNORMAL HIGH (ref 150–400)
RBC: 2.95 10*6/uL — ABNORMAL LOW (ref 4.50–6.10)
RDW-CV: 17.7 % — ABNORMAL HIGH (ref 11.5–15.5)
WBC: 16.1 10*3/uL — ABNORMAL HIGH (ref 3.7–11.0)

## 2021-06-10 LAB — BASIC METABOLIC PANEL
ANION GAP: 7 mmol/L (ref 4–13)
BUN/CREA RATIO: 29 — ABNORMAL HIGH (ref 6–22)
BUN: 22 mg/dL (ref 8–25)
CALCIUM: 8.8 mg/dL (ref 8.8–10.2)
CHLORIDE: 105 mmol/L (ref 96–111)
CO2 TOTAL: 24 mmol/L (ref 23–31)
CREATININE: 0.77 mg/dL (ref 0.75–1.35)
ESTIMATED GFR: >90 mL/min/BSA (ref >=60)
GLUCOSE: 210 mg/dL — ABNORMAL HIGH (ref 65–125)
POTASSIUM: 4 mmol/L (ref 3.5–5.1)
SODIUM: 136 mmol/L (ref 136–145)

## 2021-06-10 LAB — MANUAL DIFF AND MORPHOLOGY-SYSMEX
BASOPHIL #: <0.1 10*3/uL (ref <=0.20)
BASOPHIL %: 0 %
EOSINOPHIL #: <0.1 10*3/uL (ref <=0.50)
EOSINOPHIL %: 0 %
LYMPHOCYTE #: 7.73 10*3/uL — ABNORMAL HIGH (ref 1.00–4.80)
LYMPHOCYTE %: 48 %
MONOCYTE #: 0.48 10*3/uL (ref 0.20–1.10)
MONOCYTE %: 3 %
NEUTROPHIL #: 8.05 10*3/uL — ABNORMAL HIGH (ref 1.50–7.70)
NEUTROPHIL %: 50 %

## 2021-06-10 LAB — CBC WITH DIFF
HCT: 24.7 % — ABNORMAL LOW (ref 38.9–52.0)
HGB: 7.9 g/dL — ABNORMAL LOW (ref 13.4–17.5)
MCH: 26.8 pg (ref 26.0–32.0)
MCHC: 32 g/dL (ref 31.0–35.5)
MCV: 83.7 fL (ref 78.0–100.0)
MPV: 12.6 fL — ABNORMAL HIGH (ref 8.7–12.5)
PLATELETS: 520 10*3/uL — ABNORMAL HIGH (ref 150–400)
RBC: 2.95 10*6/uL — ABNORMAL LOW (ref 4.50–6.10)
RDW-CV: 17.7 % — ABNORMAL HIGH (ref 11.5–15.5)
WBC: 16.1 10*3/uL — ABNORMAL HIGH (ref 3.7–11.0)

## 2021-06-10 LAB — VANCOMYCIN, TROUGH: VANCOMYCIN TROUGH: 15.7 ug/mL (ref 10.0–20.0)

## 2021-06-10 NOTE — Progress Notes (Addendum)
[facility]   RETURN PATIENT EVALUATION    Patient: [name]  D.O.B.: [birth year]  MRN# [id number]  Date of Service: 06/09/2021    Subjective:  [name] is a 76 y.o. 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 The patient was diagnosed with PE and started on Eliquis. He presented to outside hospital with abdominal pain and was found to have active extravasation at the surgical site on CT imaging. He underwent mass transfusion protocol and ex lap on 04/03/21 with ligation of bleeding vessel at the pancreatectomy bed. He was transferred to [facility] and postoperatively developed a midline fistula. He denies abdominal pain, jaundice, fever or chills.   IR intervention:  1. The patient was taken to IR on 04/09/21 for increased bloody output from JP drain. He underwent angiogram with empiric embolization of splenic artery stump and left interior adrenal artery. He also had IR drain placement in large RUQ fluid collection.   2. The patient also ha a IVC filter placed given history of PE and intermittent holding of anticoagulation, this was placed on 04/16/21.  GI intervention:  1. 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   2. 05/06/21 EUS: After maturation of walled off necrosis the patient underwent a endoscopic cystogastrostomy and necrosectomy.   3. 05/20/21 EUS: Persistent LUQ collection was seen on CT imaging and endoscopic cystogastrostomy was preformed with necrosectomy.   The patient has a prolonged hospital course and was discharged to [facility] were he and his wife state that his strength and endurance are improving. He continues to struggle with PO intake secondary to nausea and is currently on a CLD and TPN. He states that over the past few weeks his  nausea worsened and that the rehab facility transitioned him to a CLD.      All other systems reviewed are negative, except those listed above. The patient denies any changes to his medical, surgical, family or social history since his discharge on 05/28/21.    Objective:  Vitals are BP 129/72   Pulse (!) 110   Temp 36.6 C (97.8 F)   Ht 1.778 m ('5\' 10"'$ )   Wt 74.2 kg (163 lb 9.3 oz)   SpO2 95%   BMI 23.47 kg/m     On examination the patient is pleasant, in NAD. Trachea is midline. No palpable cervical, supraclavicular, or axillary lymphadenopathy. Heart is RRR. Respirations are unlabored and lungs are CTA. No peripheral edema or cyanosis. Abdomen is soft, non-tender and non-distended, inferior fistula tract is not draining with good granulation tissue at base, incision is well healed with staples in place. Superior pole of wound with 1x1 cm opening that is packed. Normal muscle strength and tone of all extremities. Skin has no rashes or jaundice. He is alert and oriented without focal motor or sensory deficits. Speech pattern and mood/affect normal.    Data Reviewed (I have reviewed both the reports and images with the cosigning physician):  06/09/21 from [facility]: WBC 18.4, Hgb 8.0, plt 528, with normal MCV and differential. Na 140, K 3.2, Cl 107, bicarbonate 24, BUN 25, creatinine 0.74, glucose 218, calcium 9.1.      Assessment and Plan:  [name] is a 76 y.o. male with prolonged hospital s/p distal pancreatectomy complicated by pancreatic leak and bleed.  Patient  is recovering with improvements in endurance and strength. He will be discharged to rehab on 06/12/11. It was recommended to schedule antiemetic and to advance to regular diet. A wet to dry dressing was placed over the inferior pole of the midline wound and his midline staples were removed. We will plan for a repeat clinic appointment in two weeks with repeat CT A/P. ID is recommending 1 additional week of IV ABX. Patient with IVC filter  in place once fully recovered we will refer him back to IR for removal.  He was given the opportunity to ask questions and those questions were answered to his satisfaction. He agreed with the treatment plan and was encouraged to call with any additional questions or concerns. The patient was seen in conjunction with the cosigning faculty, Dr. Cyndi Bender today.       Allene Pyo, PA-C  06/10/2021, 09:05  Department of Surgery  Division of Surgical Oncology    I personally saw and examined the patient. See physician's assistant note for additional details.     Doing much better.  Working with PT to gain strength.  Walking without walker.  Drainage from his midline ECF has resolved. He continues to have nausea and difficulty eating that he believes may be due to abx.  He remains on cycled TPN.  Cont abx per ID.  We will see him back in 2 weeks with repeat scan. All his questions were answered and he agreed with the plan.     Lynwood Dawley, MD, FACS

## 2021-06-11 ENCOUNTER — Other Ambulatory Visit: Payer: Medicare Other | Attending: NURSE PRACTITIONER | Admitting: NURSE PRACTITIONER

## 2021-06-11 ENCOUNTER — Other Ambulatory Visit: Payer: Self-pay

## 2021-06-11 ENCOUNTER — Inpatient Hospital Stay
Admission: RE | Admit: 2021-06-11 | Discharge: 2021-06-11 | Disposition: A | Discharge status: Home / Self Care | Payer: 59 | Source: Ambulatory Visit | Attending: Diagnostic Radiology | Admitting: Diagnostic Radiology

## 2021-06-11 ENCOUNTER — Encounter (INDEPENDENT_AMBULATORY_CARE_PROVIDER_SITE_OTHER): Payer: Self-pay | Admitting: Student in an Organized Health Care Education/Training Program

## 2021-06-11 ENCOUNTER — Other Ambulatory Visit (INDEPENDENT_AMBULATORY_CARE_PROVIDER_SITE_OTHER): Payer: Medicare Other | Admitting: Medical

## 2021-06-11 ENCOUNTER — Other Ambulatory Visit (HOSPITAL_COMMUNITY): Payer: Self-pay | Admitting: Internal Medicine

## 2021-06-11 DIAGNOSIS — Q8789 Other specified congenital malformation syndromes, not elsewhere classified: Secondary | ICD-10-CM

## 2021-06-11 LAB — BASIC METABOLIC PANEL
ANION GAP: 9 mmol/L (ref 4–13)
BUN/CREA RATIO: 31 — ABNORMAL HIGH (ref 6–22)
BUN: 26 mg/dL — ABNORMAL HIGH (ref 8–25)
CALCIUM: 9.3 mg/dL (ref 8.8–10.2)
CHLORIDE: 105 mmol/L (ref 96–111)
CO2 TOTAL: 25 mmol/L (ref 23–31)
CREATININE: 0.83 mg/dL (ref 0.75–1.35)
ESTIMATED GFR: >90 mL/min/BSA (ref >=60)
GLUCOSE: 187 mg/dL — ABNORMAL HIGH (ref 65–125)
POTASSIUM: 3.3 mmol/L — ABNORMAL LOW (ref 3.5–5.1)
SODIUM: 139 mmol/L (ref 136–145)

## 2021-06-11 LAB — CBC/DIFF - CLIENT CONSOLIDATED
BASOPHIL #: <0.1 10*3/uL (ref <=0.20)
BASOPHIL %: 0 %
EOSINOPHIL #: 0.15 10*3/uL (ref <=0.50)
EOSINOPHIL %: 1 %
HCT: 24.4 % — ABNORMAL LOW (ref 38.9–52.0)
HGB: 7.5 g/dL — ABNORMAL LOW (ref 13.4–17.5)
LYMPHOCYTE #: 7.24 10*3/uL — ABNORMAL HIGH (ref 1.00–4.80)
LYMPHOCYTE %: 47 %
MCH: 26.1 pg (ref 26.0–32.0)
MCHC: 30.7 g/dL — ABNORMAL LOW (ref 31.0–35.5)
MCV: 85 fL (ref 78.0–100.0)
MONOCYTE #: 0.62 10*3/uL (ref 0.20–1.10)
MONOCYTE %: 4 %
MPV: 12.1 fL (ref 8.7–12.5)
NEUTROPHIL #: 7.39 10*3/uL (ref 1.50–7.70)
NEUTROPHIL %: 48 %
NRBC FROM MANUAL DIFF: 2 per 100 WBC
PLATELETS: 502 10*3/uL — ABNORMAL HIGH (ref 150–400)
RBC: 2.87 10*6/uL — ABNORMAL LOW (ref 4.50–6.10)
RDW-CV: 18 % — ABNORMAL HIGH (ref 11.5–15.5)
WBC: 15.4 10*3/uL — ABNORMAL HIGH (ref 3.7–11.0)

## 2021-06-11 LAB — CBC WITH DIFF
HCT: 24.4 % — ABNORMAL LOW (ref 38.9–52.0)
HGB: 7.5 g/dL — ABNORMAL LOW (ref 13.4–17.5)
MCH: 26.1 pg (ref 26.0–32.0)
MCHC: 30.7 g/dL — ABNORMAL LOW (ref 31.0–35.5)
MCV: 85 fL (ref 78.0–100.0)
MPV: 12.1 fL (ref 8.7–12.5)
PLATELETS: 502 10*3/uL — ABNORMAL HIGH (ref 150–400)
RBC: 2.87 10*6/uL — ABNORMAL LOW (ref 4.50–6.10)
RDW-CV: 18 % — ABNORMAL HIGH (ref 11.5–15.5)
WBC: 15.4 10*3/uL — ABNORMAL HIGH (ref 3.7–11.0)

## 2021-06-11 LAB — MANUAL DIFF AND MORPHOLOGY-SYSMEX
BASOPHIL #: <0.1 10*3/uL (ref <=0.20)
BASOPHIL %: 0 %
EOSINOPHIL #: 0.15 10*3/uL (ref <=0.50)
EOSINOPHIL %: 1 %
LYMPHOCYTE #: 7.24 10*3/uL — ABNORMAL HIGH (ref 1.00–4.80)
LYMPHOCYTE %: 47 %
MONOCYTE #: 0.62 10*3/uL (ref 0.20–1.10)
MONOCYTE %: 4 %
NEUTROPHIL #: 7.39 10*3/uL (ref 1.50–7.70)
NEUTROPHIL %: 48 %
NRBC FROM MANUAL DIFF: 2 per 100 WBC

## 2021-06-11 LAB — C-REACTIVE PROTEIN(CRP),INFLAMMATION: CRP INFLAMMATION: 29.4 mg/L — ABNORMAL HIGH (ref <8.0)

## 2021-06-11 LAB — SEDIMENTATION RATE: ERYTHROCYTE SEDIMENTATION RATE (ESR): 6 mm/hr (ref 0–15)

## 2021-06-11 MED ORDER — IOPAMIDOL 370 MG IODINE/ML (76 %) INTRAVENOUS SOLUTION
80.0000 mL | INTRAVENOUS | Status: AC
Start: 2021-06-11 — End: 2021-06-11
  Administered 2021-06-11: 80 mL via INTRAVENOUS

## 2021-06-11 MED ORDER — DIATRIZOATE MEGLUMINE-DIATRIZOATE SODIUM 66 %-10 % ORAL SOLUTION
8.0000 mL | ORAL | Status: AC
Start: 2021-06-11 — End: 2021-06-11
  Administered 2021-06-11: 8 mL via ORAL

## 2021-06-11 NOTE — Progress Notes (Signed)
Outpatient Parenteral Antimicrobial Therapy (OPAT) Note    OPAT Vancomycin Note   Indication: Intra-abdominal abscess with bacteremia   Antibiotic Regimen: Vancomycin q24h, meropenem 1g every 8 hours, fluconazole '400mg'$  PO daily   Duration: 3 weeks   End date: 06/17/21      Skilled Nursing Facility: [facility] [phone]     Date RPh SCr  BUN Level Plan Comments    4/29  AES 0.82 31 15.9 Continue vancomycin '1250mg'$  every 24 hours  Time vancomycin is dosed: 0700  Time of trough draw: 0442    Discussed with pharmacist Larene Beach - confirmed that trough was drawn on 4/29, not 5/1.   Patient was dosed with '1250mg'$  (first dose of this regimen) on 4/28 at 0639 while still in the hospital. Trough drawn indicative of ~22 hour level.   5/4 AES - - 13.7 Continue vancomycin '1250mg'$  every 24 hours  Time of trough draw: 0455    Early trough, expected true trough to be 10-15   5/8 AES 0.87 21 14.6 Continue vancomycin '1250mg'$  every 24 hours  Slightly early trough, expected true trough 10-15.    5/10 AES - - - - Therapy extended by Dr. Alfonzo Feller through 06/17/21. [facility] aware.    5/11 AES 0.83 26 15.7 Continue '1250mg'$  every 24 hours  Time dosed: 0700  Time stamp: 05:15    True trough ~15              [name], PharmD, BCIDP  Infectious Diseases Clinical Pharmacist  Ext. [phone]

## 2021-06-12 ENCOUNTER — Other Ambulatory Visit (INDEPENDENT_AMBULATORY_CARE_PROVIDER_SITE_OTHER): Payer: Medicare Other | Admitting: NURSE PRACTITIONER

## 2021-06-12 LAB — CBC/DIFF - CLIENT CONSOLIDATED
BASOPHIL #: <0.1 10*3/uL (ref <=0.20)
BASOPHIL %: 0 %
EOSINOPHIL #: 0.7 10*3/uL — ABNORMAL HIGH (ref <=0.50)
EOSINOPHIL %: 4 %
HCT: 25.4 % — ABNORMAL LOW (ref 38.9–52.0)
HGB: 7.8 g/dL — ABNORMAL LOW (ref 13.4–17.5)
LYMPHOCYTE #: 9.28 10*3/uL — ABNORMAL HIGH (ref 1.00–4.80)
LYMPHOCYTE %: 53 %
MCH: 26 pg (ref 26.0–32.0)
MCHC: 30.7 g/dL — ABNORMAL LOW (ref 31.0–35.5)
MCV: 84.7 fL (ref 78.0–100.0)
MONOCYTE #: 0.18 10*3/uL — ABNORMAL LOW (ref 0.20–1.10)
MONOCYTE %: 1 %
MPV: 12.7 fL — ABNORMAL HIGH (ref 8.7–12.5)
NEUTROPHIL #: 7.35 10*3/uL (ref 1.50–7.70)
NEUTROPHIL %: 42 %
PLATELETS: 546 10*3/uL — ABNORMAL HIGH (ref 150–400)
RBC: 3 10*6/uL — ABNORMAL LOW (ref 4.50–6.10)
RDW-CV: 18.2 % — ABNORMAL HIGH (ref 11.5–15.5)
WBC: 17.5 10*3/uL — ABNORMAL HIGH (ref 3.7–11.0)

## 2021-06-12 LAB — MANUAL DIFF AND MORPHOLOGY-SYSMEX
BASOPHIL #: <0.1 10*3/uL (ref <=0.20)
BASOPHIL %: 0 %
EOSINOPHIL #: 0.7 10*3/uL — ABNORMAL HIGH (ref <=0.50)
EOSINOPHIL %: 4 %
LYMPHOCYTE #: 9.28 10*3/uL — ABNORMAL HIGH (ref 1.00–4.80)
LYMPHOCYTE %: 53 %
MONOCYTE #: 0.18 10*3/uL — ABNORMAL LOW (ref 0.20–1.10)
MONOCYTE %: 1 %
NEUTROPHIL #: 7.35 10*3/uL (ref 1.50–7.70)
NEUTROPHIL %: 42 %

## 2021-06-12 LAB — BASIC METABOLIC PANEL
ANION GAP: 9 mmol/L (ref 4–13)
BUN/CREA RATIO: 26 — ABNORMAL HIGH (ref 6–22)
BUN: 21 mg/dL (ref 8–25)
CALCIUM: 9.1 mg/dL (ref 8.8–10.2)
CHLORIDE: 104 mmol/L (ref 96–111)
CO2 TOTAL: 26 mmol/L (ref 23–31)
CREATININE: 0.81 mg/dL (ref 0.75–1.35)
ESTIMATED GFR: >90 mL/min/BSA (ref >=60)
GLUCOSE: 190 mg/dL — ABNORMAL HIGH (ref 65–125)
POTASSIUM: 3.8 mmol/L (ref 3.5–5.1)
SODIUM: 139 mmol/L (ref 136–145)

## 2021-06-12 LAB — CBC WITH DIFF
HCT: 25.4 % — ABNORMAL LOW (ref 38.9–52.0)
HGB: 7.8 g/dL — ABNORMAL LOW (ref 13.4–17.5)
MCH: 26 pg (ref 26.0–32.0)
MCHC: 30.7 g/dL — ABNORMAL LOW (ref 31.0–35.5)
MCV: 84.7 fL (ref 78.0–100.0)
MPV: 12.7 fL — ABNORMAL HIGH (ref 8.7–12.5)
PLATELETS: 546 10*3/uL — ABNORMAL HIGH (ref 150–400)
RBC: 3 10*6/uL — ABNORMAL LOW (ref 4.50–6.10)
RDW-CV: 18.2 % — ABNORMAL HIGH (ref 11.5–15.5)
WBC: 17.5 10*3/uL — ABNORMAL HIGH (ref 3.7–11.0)

## 2021-06-13 ENCOUNTER — Other Ambulatory Visit: Payer: Medicare Other | Attending: NURSE PRACTITIONER | Admitting: NURSE PRACTITIONER

## 2021-06-13 LAB — CBC WITH DIFF
HCT: 24.3 % — ABNORMAL LOW (ref 38.9–52.0)
HGB: 7.5 g/dL — ABNORMAL LOW (ref 13.4–17.5)
MCH: 25.8 pg — ABNORMAL LOW (ref 26.0–32.0)
MCHC: 30.9 g/dL — ABNORMAL LOW (ref 31.0–35.5)
MCV: 83.5 fL (ref 78.0–100.0)
MPV: 12 fL (ref 8.7–12.5)
PLATELETS: 469 10*3/uL — ABNORMAL HIGH (ref 150–400)
RBC: 2.91 10*6/uL — ABNORMAL LOW (ref 4.50–6.10)
RDW-CV: 17.7 % — ABNORMAL HIGH (ref 11.5–15.5)
WBC: 14.5 10*3/uL — ABNORMAL HIGH (ref 3.7–11.0)

## 2021-06-13 LAB — CBC/DIFF - CLIENT CONSOLIDATED
BASOPHIL #: <0.1 10*3/uL (ref <=0.20)
BASOPHIL %: 0 %
EOSINOPHIL #: 0.43 10*3/uL (ref <=0.50)
EOSINOPHIL %: 3 %
HCT: 24.3 % — ABNORMAL LOW (ref 38.9–52.0)
HGB: 7.5 g/dL — ABNORMAL LOW (ref 13.4–17.5)
LYMPHOCYTE #: 5.8 10*3/uL — ABNORMAL HIGH (ref 1.00–4.80)
LYMPHOCYTE %: 40 %
MCH: 25.8 pg — ABNORMAL LOW (ref 26.0–32.0)
MCHC: 30.9 g/dL — ABNORMAL LOW (ref 31.0–35.5)
MCV: 83.5 fL (ref 78.0–100.0)
MONOCYTE #: 0.29 10*3/uL (ref 0.20–1.10)
MONOCYTE %: 2 %
MPV: 12 fL (ref 8.7–12.5)
NEUTROPHIL #: 8.12 10*3/uL — ABNORMAL HIGH (ref 1.50–7.70)
NEUTROPHIL %: 56 %
PLATELETS: 469 10*3/uL — ABNORMAL HIGH (ref 150–400)
RBC: 2.91 10*6/uL — ABNORMAL LOW (ref 4.50–6.10)
RDW-CV: 17.7 % — ABNORMAL HIGH (ref 11.5–15.5)
WBC: 14.5 10*3/uL — ABNORMAL HIGH (ref 3.7–11.0)

## 2021-06-13 LAB — BASIC METABOLIC PANEL
ANION GAP: 8 mmol/L (ref 4–13)
BUN/CREA RATIO: 26 — ABNORMAL HIGH (ref 6–22)
BUN: 21 mg/dL (ref 8–25)
CALCIUM: 9.6 mg/dL (ref 8.8–10.2)
CHLORIDE: 105 mmol/L (ref 96–111)
CO2 TOTAL: 26 mmol/L (ref 23–31)
CREATININE: 0.8 mg/dL (ref 0.75–1.35)
ESTIMATED GFR: >90 mL/min/BSA (ref >=60)
GLUCOSE: 168 mg/dL — ABNORMAL HIGH (ref 65–125)
POTASSIUM: 3.5 mmol/L (ref 3.5–5.1)
SODIUM: 139 mmol/L (ref 136–145)

## 2021-06-13 LAB — MANUAL DIFF AND MORPHOLOGY-SYSMEX
BASOPHIL #: <0.1 10*3/uL (ref <=0.20)
BASOPHIL %: 0 %
EOSINOPHIL #: 0.43 10*3/uL (ref <=0.50)
EOSINOPHIL %: 3 %
LYMPHOCYTE #: 5.8 10*3/uL — ABNORMAL HIGH (ref 1.00–4.80)
LYMPHOCYTE %: 40 %
MONOCYTE #: 0.29 10*3/uL (ref 0.20–1.10)
MONOCYTE %: 2 %
NEUTROPHIL #: 8.12 10*3/uL — ABNORMAL HIGH (ref 1.50–7.70)
NEUTROPHIL %: 56 %

## 2021-06-14 ENCOUNTER — Other Ambulatory Visit (INDEPENDENT_AMBULATORY_CARE_PROVIDER_SITE_OTHER): Payer: Medicare Other | Admitting: Internal Medicine

## 2021-06-14 DIAGNOSIS — R140 Abdominal distension (gaseous): Secondary | ICD-10-CM

## 2021-06-14 DIAGNOSIS — J90 Pleural effusion, not elsewhere classified: Secondary | ICD-10-CM

## 2021-06-14 DIAGNOSIS — Q8789 Other specified congenital malformation syndromes, not elsewhere classified: Secondary | ICD-10-CM

## 2021-06-14 DIAGNOSIS — Z4682 Encounter for fitting and adjustment of non-vascular catheter: Secondary | ICD-10-CM

## 2021-06-14 DIAGNOSIS — R188 Other ascites: Secondary | ICD-10-CM

## 2021-06-14 LAB — CBC WITH DIFF
HCT: 25.4 % — ABNORMAL LOW (ref 38.9–52.0)
HGB: 7.7 g/dL — ABNORMAL LOW (ref 13.4–17.5)
MCH: 25.7 pg — ABNORMAL LOW (ref 26.0–32.0)
MCHC: 30.3 g/dL — ABNORMAL LOW (ref 31.0–35.5)
MCV: 84.7 fL (ref 78.0–100.0)
MPV: 12.4 fL (ref 8.7–12.5)
PLATELETS: 507 10*3/uL — ABNORMAL HIGH (ref 150–400)
RBC: 3 10*6/uL — ABNORMAL LOW (ref 4.50–6.10)
RDW-CV: 18.4 % — ABNORMAL HIGH (ref 11.5–15.5)
WBC: 14 10*3/uL — ABNORMAL HIGH (ref 3.7–11.0)

## 2021-06-14 LAB — CBC/DIFF - CLIENT CONSOLIDATED
BASOPHIL #: <0.1 10*3/uL (ref <=0.20)
BASOPHIL %: 0 %
EOSINOPHIL #: 0.56 10*3/uL — ABNORMAL HIGH (ref <=0.50)
EOSINOPHIL %: 4 %
HCT: 25.4 % — ABNORMAL LOW (ref 38.9–52.0)
HGB: 7.7 g/dL — ABNORMAL LOW (ref 13.4–17.5)
LYMPHOCYTE #: 6.44 10*3/uL — ABNORMAL HIGH (ref 1.00–4.80)
LYMPHOCYTE %: 46 %
MCH: 25.7 pg — ABNORMAL LOW (ref 26.0–32.0)
MCHC: 30.3 g/dL — ABNORMAL LOW (ref 31.0–35.5)
MCV: 84.7 fL (ref 78.0–100.0)
MONOCYTE #: 0.14 10*3/uL — ABNORMAL LOW (ref 0.20–1.10)
MONOCYTE %: 1 %
MPV: 12.4 fL (ref 8.7–12.5)
NEUTROPHIL #: 7 10*3/uL (ref 1.50–7.70)
NEUTROPHIL %: 50 %
PLATELETS: 507 10*3/uL — ABNORMAL HIGH (ref 150–400)
RBC: 3 10*6/uL — ABNORMAL LOW (ref 4.50–6.10)
RDW-CV: 18.4 % — ABNORMAL HIGH (ref 11.5–15.5)
WBC: 14 10*3/uL — ABNORMAL HIGH (ref 3.7–11.0)

## 2021-06-14 LAB — BASIC METABOLIC PANEL
ANION GAP: 11 mmol/L (ref 4–13)
BUN/CREA RATIO: 21 (ref 6–22)
BUN: 18 mg/dL (ref 8–25)
CALCIUM: 9.7 mg/dL (ref 8.8–10.2)
CHLORIDE: 104 mmol/L (ref 96–111)
CO2 TOTAL: 27 mmol/L (ref 23–31)
CREATININE: 0.86 mg/dL (ref 0.75–1.35)
ESTIMATED GFR: 90 mL/min/BSA (ref >=60)
GLUCOSE: 164 mg/dL — ABNORMAL HIGH (ref 65–125)
POTASSIUM: 3.5 mmol/L (ref 3.5–5.1)
SODIUM: 142 mmol/L (ref 136–145)

## 2021-06-14 LAB — MANUAL DIFF AND MORPHOLOGY-SYSMEX
BASOPHIL #: <0.1 10*3/uL (ref <=0.20)
BASOPHIL %: 0 %
EOSINOPHIL #: 0.56 10*3/uL — ABNORMAL HIGH (ref <=0.50)
EOSINOPHIL %: 4 %
LYMPHOCYTE #: 6.44 10*3/uL — ABNORMAL HIGH (ref 1.00–4.80)
LYMPHOCYTE %: 46 %
MONOCYTE #: 0.14 10*3/uL — ABNORMAL LOW (ref 0.20–1.10)
MONOCYTE %: 1 %
NEUTROPHIL #: 7 10*3/uL (ref 1.50–7.70)
NEUTROPHIL %: 50 %

## 2021-06-14 LAB — VANCOMYCIN, TROUGH: VANCOMYCIN TROUGH: 11.1 ug/mL (ref 10.0–20.0)

## 2021-06-14 LAB — C-REACTIVE PROTEIN (CRP): CRP INFLAMMATION: 22.9 mg/L — ABNORMAL HIGH (ref <8.0)

## 2021-06-14 LAB — PROCALCITONIN: PROCALCITONIN: 0.05 ng/mL (ref <0.50)

## 2021-06-15 ENCOUNTER — Encounter (INDEPENDENT_AMBULATORY_CARE_PROVIDER_SITE_OTHER): Payer: Self-pay | Admitting: Student in an Organized Health Care Education/Training Program

## 2021-06-15 ENCOUNTER — Other Ambulatory Visit (INDEPENDENT_AMBULATORY_CARE_PROVIDER_SITE_OTHER): Payer: Medicare Other | Admitting: NURSE PRACTITIONER

## 2021-06-15 ENCOUNTER — Other Ambulatory Visit (INDEPENDENT_AMBULATORY_CARE_PROVIDER_SITE_OTHER): Payer: Self-pay | Admitting: SURGICAL ONCOLOGY

## 2021-06-15 DIAGNOSIS — Z23 Encounter for immunization: Secondary | ICD-10-CM

## 2021-06-15 DIAGNOSIS — Z9081 Acquired absence of spleen: Secondary | ICD-10-CM

## 2021-06-15 NOTE — Progress Notes (Signed)
Outpatient Parenteral Antimicrobial Therapy (OPAT) Note    OPAT Vancomycin Note   Indication: Intra-abdominal abscess with bacteremia   Antibiotic Regimen: Vancomycin q24h, meropenem 1g every 8 hours, fluconazole '400mg'$  PO daily   Duration: 3 weeks   End date: 06/17/21      Skilled Nursing Facility: [facility] [phone]     Date RPh SCr  BUN Level Plan Comments    4/29  AES 0.82 31 15.9 Continue vancomycin '1250mg'$  every 24 hours  Time vancomycin is dosed: 0700  Time of trough draw: 0442    Discussed with pharmacist Larene Beach - confirmed that trough was drawn on 4/29, not 5/1.   Patient was dosed with '1250mg'$  (first dose of this regimen) on 4/28 at 0639 while still in the hospital. Trough drawn indicative of ~22 hour level.   5/4 AES - - 13.7 Continue vancomycin '1250mg'$  every 24 hours  Time of trough draw: 0455    Early trough, expected true trough to be 10-15   5/8 AES 0.87 21 14.6 Continue vancomycin '1250mg'$  every 24 hours  Slightly early trough, expected true trough 10-15.    5/10 AES - - - - Therapy extended by Dr. Alfonzo Feller through 06/17/21. [facility] aware.    5/11 AES 0.83 26 15.7 Continue '1250mg'$  every 24 hours  Time dosed: 0700  Time stamp: 05:15    True trough ~15   5/15 AES 0.86 18 11.1 Continue '1250mg'$  every 24 hours  Approaching end date of 06/17/21     Celedonio Miyamoto, PharmD, [place]  Infectious Diseases Clinical Pharmacist  Ext. [phone]

## 2021-06-15 NOTE — Progress Notes (Signed)
Outpatient Parenteral Antimicrobial Therapy (OPAT) Note    OPAT Vancomycin Note   Indication: Intra-abdominal abscess with bacteremia   Antibiotic Regimen: Vancomycin q24h, meropenem 1g every 8 hours, fluconazole '400mg'$  PO daily   Duration: 3 weeks   End date: 06/17/21          Home infusion: [place]: [facility]     Notified by [facility] that the patient was discharged home from [facility] on 5/15 with orders for vancomycin 1g q24 and meropenem 1g q8. Of note, the ordered dose of vancomycin is lower than what we had previously documented the patient to be receiving at [facility] (1250 mg q24) and stop dates ordered by the [facility] physician were 5/19 for vancomycin and 5/18 for meropenem. Supplies have already been sent to the patient, will proceed with these stop dates. Per [facility], home health to obtain labs on 5/18.    Nils Pyle, PharmD  Infectious Diseases Clinical Pharmacist  OPAT/COpAT/Antimicrobial Stewardship  [phone]

## 2021-06-16 ENCOUNTER — Encounter (INDEPENDENT_AMBULATORY_CARE_PROVIDER_SITE_OTHER): Payer: Self-pay

## 2021-06-17 ENCOUNTER — Other Ambulatory Visit: Payer: Medicare Other | Attending: INTERNAL MEDICINE | Admitting: INTERNAL MEDICINE

## 2021-06-17 ENCOUNTER — Other Ambulatory Visit (INDEPENDENT_AMBULATORY_CARE_PROVIDER_SITE_OTHER): Payer: Medicare Other | Admitting: Internal Medicine

## 2021-06-17 ENCOUNTER — Other Ambulatory Visit (INDEPENDENT_AMBULATORY_CARE_PROVIDER_SITE_OTHER): Payer: Self-pay | Admitting: SURGICAL ONCOLOGY

## 2021-06-17 ENCOUNTER — Other Ambulatory Visit: Payer: Self-pay

## 2021-06-17 DIAGNOSIS — G7281 Critical illness myopathy: Secondary | ICD-10-CM | POA: Insufficient documentation

## 2021-06-17 LAB — BUN: BUN: 26 mg/dL — ABNORMAL HIGH (ref 8–25)

## 2021-06-17 LAB — VANCOMYCIN, TROUGH: VANCOMYCIN TROUGH: 13.7 ug/mL (ref 10.0–20.0)

## 2021-06-17 LAB — CREATININE WITH EGFR
CREATININE: 0.9 mg/dL (ref 0.75–1.35)
ESTIMATED GFR: 89 mL/min/BSA (ref >=60)

## 2021-06-17 MED ORDER — FREESTYLE LIBRE 2 SENSOR KIT
PACK | 3 refills | Status: DC
Start: 2021-06-17 — End: 2021-08-15

## 2021-06-18 ENCOUNTER — Encounter (INDEPENDENT_AMBULATORY_CARE_PROVIDER_SITE_OTHER): Payer: Self-pay | Admitting: Student in an Organized Health Care Education/Training Program

## 2021-06-18 ENCOUNTER — Ambulatory Visit (INDEPENDENT_AMBULATORY_CARE_PROVIDER_SITE_OTHER): Payer: Self-pay | Admitting: SURGICAL ONCOLOGY

## 2021-06-18 NOTE — Telephone Encounter (Signed)
Called and advised pt rx was sent in.  06/18/2021, 08:33  Lambert Mody, RN  Clinical Nurse Coordinator  Department of Surgery  P: [phone]

## 2021-06-18 NOTE — Progress Notes (Signed)
Outpatient Parenteral Antimicrobial Therapy (OPAT) Note    OPAT Vancomycin Note   Indication: Intra-abdominal abscess with bacteremia   Antibiotic Regimen: Vancomycin q24h, meropenem 1g every 8 hours, fluconazole '400mg'$  PO daily   Duration: 3 weeks   End date: 06/17/21          Home infusion: [place]: [facility]     Patient has completed IV abx therapy. Discussed with HH and HI. Line maintained d/t TPN.    Celedonio Miyamoto, PharmD, [place]  Infectious Diseases Clinical Pharmacist  Ext. [phone]

## 2021-06-21 ENCOUNTER — Other Ambulatory Visit: Payer: Medicare Other | Attending: FAMILY PRACTICE | Admitting: FAMILY PRACTICE

## 2021-06-21 ENCOUNTER — Emergency Department (EMERGENCY_DEPARTMENT_HOSPITAL): Payer: 59

## 2021-06-21 ENCOUNTER — Emergency Department (HOSPITAL_COMMUNITY): Payer: 59

## 2021-06-21 ENCOUNTER — Encounter (HOSPITAL_COMMUNITY): Payer: Self-pay

## 2021-06-21 ENCOUNTER — Inpatient Hospital Stay (HOSPITAL_COMMUNITY): Payer: Medicare Other | Admitting: Internal Medicine

## 2021-06-21 ENCOUNTER — Inpatient Hospital Stay
Admission: EM | Admit: 2021-06-21 | Discharge: 2021-06-27 | DRG: 969 | Disposition: A | Discharge status: Home Health | Payer: 59 | Attending: Internal Medicine | Admitting: Internal Medicine

## 2021-06-21 ENCOUNTER — Other Ambulatory Visit: Payer: Self-pay

## 2021-06-21 DIAGNOSIS — Z8673 Personal history of transient ischemic attack (TIA), and cerebral infarction without residual deficits: Secondary | ICD-10-CM

## 2021-06-21 DIAGNOSIS — Z8546 Personal history of malignant neoplasm of prostate: Secondary | ICD-10-CM

## 2021-06-21 DIAGNOSIS — E785 Hyperlipidemia, unspecified: Secondary | ICD-10-CM | POA: Diagnosis present

## 2021-06-21 DIAGNOSIS — Z79890 Hormone replacement therapy (postmenopausal): Secondary | ICD-10-CM

## 2021-06-21 DIAGNOSIS — Z79899 Other long term (current) drug therapy: Secondary | ICD-10-CM

## 2021-06-21 DIAGNOSIS — S22060A Wedge compression fracture of T7-T8 vertebra, initial encounter for closed fracture: Secondary | ICD-10-CM

## 2021-06-21 DIAGNOSIS — D638 Anemia in other chronic diseases classified elsewhere: Secondary | ICD-10-CM | POA: Diagnosis present

## 2021-06-21 DIAGNOSIS — Z86711 Personal history of pulmonary embolism: Secondary | ICD-10-CM

## 2021-06-21 DIAGNOSIS — Z933 Colostomy status: Secondary | ICD-10-CM

## 2021-06-21 DIAGNOSIS — B20 Human immunodeficiency virus [HIV] disease: Secondary | ICD-10-CM | POA: Diagnosis present

## 2021-06-21 DIAGNOSIS — G4700 Insomnia, unspecified: Secondary | ICD-10-CM | POA: Diagnosis present

## 2021-06-21 DIAGNOSIS — K8591 Acute pancreatitis with uninfected necrosis, unspecified: Secondary | ICD-10-CM | POA: Diagnosis present

## 2021-06-21 DIAGNOSIS — M4854XA Collapsed vertebra, not elsewhere classified, thoracic region, initial encounter for fracture: Secondary | ICD-10-CM | POA: Diagnosis present

## 2021-06-21 DIAGNOSIS — R0602 Shortness of breath: Secondary | ICD-10-CM

## 2021-06-21 DIAGNOSIS — Z8507 Personal history of malignant neoplasm of pancreas: Secondary | ICD-10-CM

## 2021-06-21 DIAGNOSIS — Z86718 Personal history of other venous thrombosis and embolism: Secondary | ICD-10-CM

## 2021-06-21 DIAGNOSIS — I10 Essential (primary) hypertension: Secondary | ICD-10-CM | POA: Diagnosis present

## 2021-06-21 DIAGNOSIS — Z6823 Body mass index (BMI) 23.0-23.9, adult: Secondary | ICD-10-CM

## 2021-06-21 DIAGNOSIS — K91840 Postprocedural hemorrhage and hematoma of a digestive system organ or structure following a digestive system procedure: Secondary | ICD-10-CM | POA: Insufficient documentation

## 2021-06-21 DIAGNOSIS — C61 Malignant neoplasm of prostate: Secondary | ICD-10-CM

## 2021-06-21 DIAGNOSIS — Z90411 Acquired partial absence of pancreas: Secondary | ICD-10-CM

## 2021-06-21 DIAGNOSIS — K219 Gastro-esophageal reflux disease without esophagitis: Secondary | ICD-10-CM | POA: Diagnosis present

## 2021-06-21 DIAGNOSIS — D490 Neoplasm of unspecified behavior of digestive system: Secondary | ICD-10-CM

## 2021-06-21 DIAGNOSIS — J9601 Acute respiratory failure with hypoxia: Secondary | ICD-10-CM | POA: Diagnosis present

## 2021-06-21 DIAGNOSIS — M3119 Other thrombotic microangiopathy (CMS HCC): Secondary | ICD-10-CM

## 2021-06-21 DIAGNOSIS — T8189XA Other complications of procedures, not elsewhere classified, initial encounter: Secondary | ICD-10-CM | POA: Diagnosis present

## 2021-06-21 DIAGNOSIS — R109 Unspecified abdominal pain: Secondary | ICD-10-CM | POA: Diagnosis present

## 2021-06-21 DIAGNOSIS — E441 Mild protein-calorie malnutrition: Secondary | ICD-10-CM | POA: Diagnosis present

## 2021-06-21 DIAGNOSIS — F419 Anxiety disorder, unspecified: Secondary | ICD-10-CM | POA: Diagnosis present

## 2021-06-21 DIAGNOSIS — J189 Pneumonia, unspecified organism: Principal | ICD-10-CM | POA: Diagnosis present

## 2021-06-21 DIAGNOSIS — R58 Hemorrhage, not elsewhere classified: Secondary | ICD-10-CM

## 2021-06-21 DIAGNOSIS — Z9049 Acquired absence of other specified parts of digestive tract: Secondary | ICD-10-CM

## 2021-06-21 DIAGNOSIS — I110 Hypertensive heart disease with heart failure: Secondary | ICD-10-CM

## 2021-06-21 DIAGNOSIS — Z7901 Long term (current) use of anticoagulants: Secondary | ICD-10-CM

## 2021-06-21 DIAGNOSIS — S22069A Unspecified fracture of T7-T8 vertebra, initial encounter for closed fracture: Secondary | ICD-10-CM

## 2021-06-21 DIAGNOSIS — R251 Tremor, unspecified: Secondary | ICD-10-CM | POA: Diagnosis present

## 2021-06-21 DIAGNOSIS — R42 Dizziness and giddiness: Secondary | ICD-10-CM

## 2021-06-21 DIAGNOSIS — E039 Hypothyroidism, unspecified: Secondary | ICD-10-CM | POA: Diagnosis present

## 2021-06-21 DIAGNOSIS — E1142 Type 2 diabetes mellitus with diabetic polyneuropathy: Secondary | ICD-10-CM | POA: Diagnosis present

## 2021-06-21 DIAGNOSIS — E876 Hypokalemia: Secondary | ICD-10-CM | POA: Diagnosis present

## 2021-06-21 DIAGNOSIS — Z923 Personal history of irradiation: Secondary | ICD-10-CM

## 2021-06-21 DIAGNOSIS — I509 Heart failure, unspecified: Secondary | ICD-10-CM

## 2021-06-21 LAB — ECG 12-LEAD
Calculated T Axis: 42 degrees
PR Interval: 138 ms
QRS Duration: 74 ms
QT Interval: 368 ms
QTC Calculation: 474 ms

## 2021-06-21 LAB — TRIGLYCERIDE: TRIGLYCERIDES: 183 mg/dL — ABNORMAL HIGH (ref <150)

## 2021-06-21 LAB — COMPREHENSIVE METABOLIC PANEL, NON-FASTING
ALBUMIN: 3.2 g/dL — ABNORMAL LOW (ref 3.4–4.8)
ALKALINE PHOSPHATASE: 131 U/L — ABNORMAL HIGH (ref 45–115)
ALT (SGPT): 13 U/L (ref 10–55)
ANION GAP: 12 mmol/L (ref 4–13)
AST (SGOT): 24 U/L (ref 8–45)
BILIRUBIN TOTAL: 0.4 mg/dL (ref 0.3–1.3)
BUN/CREA RATIO: 23 — ABNORMAL HIGH (ref 6–22)
BUN: 19 mg/dL (ref 8–25)
CALCIUM: 9.5 mg/dL (ref 8.8–10.2)
CHLORIDE: 109 mmol/L (ref 96–111)
CO2 TOTAL: 26 mmol/L (ref 23–31)
CREATININE: 0.82 mg/dL (ref 0.75–1.35)
ESTIMATED GFR: >90 mL/min/BSA (ref >=60)
GLUCOSE: 112 mg/dL (ref 65–125)
POTASSIUM: 3.6 mmol/L (ref 3.5–5.1)
PROTEIN TOTAL: 6.3 g/dL (ref 6.0–8.0)
SODIUM: 147 mmol/L — ABNORMAL HIGH (ref 136–145)

## 2021-06-21 LAB — PHOSPHORUS: PHOSPHORUS: 3.7 mg/dL (ref 2.3–4.0)

## 2021-06-21 LAB — CBC WITH DIFF
HCT: 25.7 % — ABNORMAL LOW (ref 38.9–52.0)
HCT: 24.6 % — ABNORMAL LOW (ref 38.9–52.0)
HGB: 7.8 g/dL — ABNORMAL LOW (ref 13.4–17.5)
HGB: 7.7 g/dL — ABNORMAL LOW (ref 13.4–17.5)
MCH: 25.4 pg — ABNORMAL LOW (ref 26.0–32.0)
MCH: 25.7 pg — ABNORMAL LOW (ref 26.0–32.0)
MCHC: 30.4 g/dL — ABNORMAL LOW (ref 31.0–35.5)
MCHC: 31.3 g/dL (ref 31.0–35.5)
MCV: 83.7 fL (ref 78.0–100.0)
MCV: 82 fL (ref 78.0–100.0)
MPV: 12.7 fL — ABNORMAL HIGH (ref 8.7–12.5)
MPV: 12.3 fL (ref 8.7–12.5)
PLATELETS: 503 10*3/uL — ABNORMAL HIGH (ref 150–400)
PLATELETS: 511 10*3/uL — ABNORMAL HIGH (ref 150–400)
RBC: 3.07 10*6/uL — ABNORMAL LOW (ref 4.50–6.10)
RBC: 3 10*6/uL — ABNORMAL LOW (ref 4.50–6.10)
RDW-CV: 19.5 % — ABNORMAL HIGH (ref 11.5–15.5)
RDW-CV: 18.6 % — ABNORMAL HIGH (ref 11.5–15.5)
WBC: 14.5 10*3/uL — ABNORMAL HIGH (ref 3.7–11.0)
WBC: 13.3 10*3/uL — ABNORMAL HIGH (ref 3.7–11.0)

## 2021-06-21 LAB — MANUAL DIFF AND MORPHOLOGY-SYSMEX
BASOPHIL #: <0.1 10*3/uL (ref <=0.20)
BASOPHIL #: <0.1 10*3/uL (ref <=0.20)
BASOPHIL %: 0 %
BASOPHIL %: 0 %
EOSINOPHIL #: <0.1 10*3/uL (ref <=0.50)
EOSINOPHIL #: 0.13 10*3/uL (ref <=0.50)
EOSINOPHIL %: 0 %
EOSINOPHIL %: 1 %
LYMPHOCYTE #: 6.53 10*3/uL — ABNORMAL HIGH (ref 1.00–4.80)
LYMPHOCYTE #: 7.18 10*3/uL — ABNORMAL HIGH (ref 1.00–4.80)
LYMPHOCYTE %: 39 %
LYMPHOCYTE %: 54 %
MONOCYTE #: 0.87 10*3/uL (ref 0.20–1.10)
MONOCYTE #: 0.8 10*3/uL (ref 0.20–1.10)
MONOCYTE %: 6 %
MONOCYTE %: 6 %
NEUTROPHIL #: 7.11 10*3/uL (ref 1.50–7.70)
NEUTROPHIL #: 5.19 10*3/uL (ref 1.50–7.70)
NEUTROPHIL %: 49 %
NEUTROPHIL %: 39 %
REACTIVE LYMPHOCYTE %: 6 %

## 2021-06-21 LAB — BASIC METABOLIC PANEL
ANION GAP: 8 mmol/L (ref 4–13)
BUN/CREA RATIO: 21 (ref 6–22)
BUN: 19 mg/dL (ref 8–25)
CALCIUM: 9.7 mg/dL (ref 8.8–10.2)
CHLORIDE: 108 mmol/L (ref 96–111)
CO2 TOTAL: 26 mmol/L (ref 23–31)
CREATININE: 0.89 mg/dL (ref 0.75–1.35)
ESTIMATED GFR: 89 mL/min/BSA (ref >=60)
GLUCOSE: 106 mg/dL (ref 65–125)
POTASSIUM: 3.1 mmol/L — ABNORMAL LOW (ref 3.5–5.1)
SODIUM: 142 mmol/L (ref 136–145)

## 2021-06-21 LAB — TROPONIN-I (FOR ED ONLY): TROPONIN I: 8 ng/L (ref <=30)

## 2021-06-21 LAB — PT/INR
INR: 1.37 — ABNORMAL HIGH (ref 0.80–1.20)
PROTHROMBIN TIME: 16 seconds — ABNORMAL HIGH (ref 9.1–13.9)

## 2021-06-21 LAB — MAGNESIUM: MAGNESIUM: 1.9 mg/dL (ref 1.8–2.6)

## 2021-06-21 LAB — PTT (PARTIAL THROMBOPLASTIN TIME): APTT: 36.1 seconds (ref 24.2–37.5)

## 2021-06-21 LAB — POC BLOOD GLUCOSE (RESULTS): GLUCOSE, POC: 124 mg/dl — ABNORMAL HIGH (ref 70–105)

## 2021-06-21 MED ORDER — IOPAMIDOL 370 MG IODINE/ML (76 %) INTRAVENOUS SOLUTION
50.0000 mL | INTRAVENOUS | Status: AC
Start: 2021-06-21 — End: 2021-06-21
  Administered 2021-06-21: 50 mL via INTRAVENOUS

## 2021-06-21 MED ORDER — ONDANSETRON HCL (PF) 4 MG/2 ML INJECTION SOLUTION
4.0000 mg | INTRAMUSCULAR | Status: AC
Start: 2021-06-21 — End: 2021-06-21
  Administered 2021-06-21: 4 mg via INTRAVENOUS
  Filled 2021-06-21: qty 2

## 2021-06-21 MED ORDER — DEXTROSE 5% IN WATER (D5W) FLUSH BAG - 250 ML
INTRAVENOUS | Status: DC | PRN
Start: 2021-06-21 — End: 2021-06-21

## 2021-06-21 MED ORDER — SODIUM CHLORIDE 0.9 % INTRAVENOUS PIGGYBACK
2.0000 g | INTRAVENOUS | Status: AC
Start: 2021-06-21 — End: 2021-06-22
  Administered 2021-06-21: 2 g via INTRAVENOUS
  Administered 2021-06-22: 0 g via INTRAVENOUS
  Filled 2021-06-21: qty 12.5

## 2021-06-21 MED ORDER — SODIUM CHLORIDE 0.9 % (FLUSH) INJECTION SYRINGE
2.0000 mL | INJECTION | Freq: Three times a day (TID) | INTRAMUSCULAR | Status: DC
Start: 2021-06-21 — End: 2021-06-21

## 2021-06-21 MED ORDER — VANCOMYCIN 10 GRAM INTRAVENOUS SOLUTION
20.0000 mg/kg | INTRAVENOUS | Status: AC
Start: 2021-06-21 — End: 2021-06-22
  Administered 2021-06-22: 1500 mg via INTRAVENOUS
  Administered 2021-06-22: 0 mg via INTRAVENOUS
  Filled 2021-06-21: qty 15

## 2021-06-21 MED ORDER — SODIUM CHLORIDE 0.9 % (FLUSH) INJECTION SYRINGE
2.0000 mL | INJECTION | INTRAMUSCULAR | Status: DC | PRN
Start: 2021-06-21 — End: 2021-06-21

## 2021-06-21 MED ORDER — SODIUM CHLORIDE 0.9 % INTRAVENOUS PIGGYBACK
2.0000 g | Freq: Two times a day (BID) | INTRAVENOUS | Status: DC
Start: 2021-06-22 — End: 2021-06-26
  Administered 2021-06-22: 2 g via INTRAVENOUS
  Administered 2021-06-22: 0 g via INTRAVENOUS
  Administered 2021-06-22 – 2021-06-23 (×3): 2 g via INTRAVENOUS
  Administered 2021-06-23 (×2): 0 g via INTRAVENOUS
  Administered 2021-06-24: 2 g via INTRAVENOUS
  Administered 2021-06-24: 0 g via INTRAVENOUS
  Administered 2021-06-24: 2 g via INTRAVENOUS
  Administered 2021-06-24: 0 g via INTRAVENOUS
  Administered 2021-06-25: 2 g via INTRAVENOUS
  Administered 2021-06-25 (×2): 0 g via INTRAVENOUS
  Administered 2021-06-26 (×2): 2 g via INTRAVENOUS
  Administered 2021-06-26 (×2): 0 g via INTRAVENOUS
  Filled 2021-06-21 (×9): qty 12.5

## 2021-06-21 MED ORDER — IOPAMIDOL 370 MG IODINE/ML (76 %) INTRAVENOUS SOLUTION
100.0000 mL | INTRAVENOUS | Status: AC
Start: 2021-06-21 — End: 2021-06-21
  Administered 2021-06-21: 100 mL via INTRAVENOUS

## 2021-06-21 MED ORDER — SODIUM CHLORIDE 0.9% FLUSH BAG - 250 ML
INTRAVENOUS | Status: DC | PRN
Start: 2021-06-21 — End: 2021-06-21

## 2021-06-21 NOTE — ED Resident Handoff Note (Signed)
[facility] - Emergency Department  Resident Course Note    Patient Name: [name]  Age and Gender: 76 y.o. male  Date of Birth: [birth year]  Date of Service: 06/21/2021  MRN: [id number]  PCP: [facility]  Attending: Dr. Luiz Ochoa      No Known Allergies    VS:  Temperature: 36.5 C (97.7 F)  Heart Rate: 99  Respiratory Rate: 16  BP (Non-Invasive): 130/71  SpO2: 96 %    Chief Complaint   Patient presents with   . 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 Today reports shaking, dizziness, and confusion. Wife reports FS 107 at home.  A&O x3 at time of arrival. Denies fever/chills. Pt on blood thinners. Was told he had a stroke while hospitalized in February. FS 124 in triage. Denies chest pain/shortness of breath. Endorses generalized abdominal pain w/ mild distention.       HPI:  In brief, patient is a 76 y.o. male who presents to the emergency department due to Dizziness (Patient had surgery partial pancreectomy and splenectomy in February, [facility]. Today reports shaking, dizziness, and confusion. Wife reports FS 107 at home.  A&O x3 at time of arrival. Denies fever/chills. Pt on blood thinners. Was told he had a stroke while hospitalized in February. FS 124 in triage. Denies chest pain/shortness of breath. Endorses generalized abdominal pain w/ mild distention.).     Hx of HIV and panc ca  1 day of dysarthria and dizziness and BLE weakness and tremor--sudden onset tremor within the past few days  Scan c/f T8 compression fx  Anemia chronic  Spine aware of spinal fx, med to admit for inability to do ADLs  C/f infectious process, on TPN  Med called for admit      Pertinent Exam Findings:  See primary    Pertinent Imaging/Lab results:  Labs Ordered/Reviewed   BASIC METABOLIC PANEL - Abnormal; Notable for the following components:       Result Value    POTASSIUM 3.1 (*)     All  other components within normal limits   PT/INR - Abnormal; Notable for the following components:    PROTHROMBIN TIME 16.0 (*)     INR 1.37 (*)     All other components within normal limits    Narrative:     Coumadin therapy INR range for Conventional Anticoagulation is 2.0 to 3.0 and for Intensive Anticoagulation 2.5 to 3.5.   CBC WITH DIFF - Abnormal; Notable for the following components:    WBC 13.3 (*)     RBC 3.00 (*)     HGB 7.7 (*)     HCT 24.6 (*)     MCH 25.7 (*)     RDW-CV 18.6 (*)     PLATELETS 511 (*)     All other components within normal limits   URINALYSIS, MICROSCOPIC - Abnormal; Notable for the following components:    GRANULAR CASTS 2 (*)     All other components within normal limits   MANUAL DIFF AND MORPHOLOGY-SYSMEX - Abnormal; Notable for the following components:    LYMPHOCYTE # 7.18 (*)     ACANTHOCYTES (SPUR CELL) 2+/Moderate (*)     ANISOCYTOSIS 2+/Moderate (*)     HOWELL JOLLY BODIES Present (*)     SPHEROCYTES 2+/Moderate (*)     TARGET CELLS 2+/Moderate (*)  All other components within normal limits   POC BLOOD GLUCOSE (RESULTS) - Abnormal; Notable for the following components:    GLUCOSE, POC 124 (*)     All other components within normal limits   TROPONIN-I (FOR ED ONLY) - Normal   PTT (PARTIAL THROMBOPLASTIN TIME) - Normal    Narrative:     Therapeutic range for unfractionated heparin is 60-100 seconds.   URINALYSIS, MACROSCOPIC - Normal   ADULT ROUTINE BLOOD CULTURE, SET OF 2 BOTTLES (BACTERIA AND YEAST)   ADULT ROUTINE BLOOD CULTURE, SET OF 2 BOTTLES (BACTERIA AND YEAST)   CBC/DIFF    Narrative:     The following orders were created for panel order CBC/DIFF.  Procedure                               Abnormality         Status                     ---------                               -----------         ------                     CBC WITH DIFF[[id number]]                Abnormal            Final result               MANUAL DIFF AND MORPHOLO.Marland KitchenMarland Kitchen[[id number]]  Abnormal            Final  result                 Please view results for these tests on the individual orders.   URINALYSIS, MACROSCOPIC AND MICROSCOPIC W/CULTURE REFLEX    Narrative:     The following orders were created for panel order URINALYSIS, MACROSCOPIC AND MICROSCOPIC W/CULTURE REFLEX.  Procedure                               Abnormality         Status                     ---------                               -----------         ------                     URINALYSIS, MACROSCOPIC[[id number]]      Normal              Final result               URINALYSIS, MICROSCOPIC[[id number]]      Abnormal            Final result                 Please view results for these tests on the individual orders.   PERFORM POC WHOLE BLOOD GLUCOSE   PERFORM POC WHOLE BLOOD GLUCOSE   PERFORM POC WHOLE BLOOD GLUCOSE   PERFORM POC WHOLE BLOOD GLUCOSE  XR AP MOBILE CHEST   Final Result   1.Bibasilar atelectasis.   2.Compression deformity of T8.      CT ANGIO CAROTID-EXTRACRANIAL (NECK) W IV CONTRAST   Preliminary Result   Widely patent extracranial arterial vasculature.               CT ANGIO INTRACRANIAL W/WO IV CONTRAST   Preliminary Result   1.Mild atherosclerotic changes. No severe stenosis, occlusion, or aneurysm.   2.No acute intracranial process.            CT CHEST ABDOMEN PELVIS W IV CONTRAST   Final Result   1.Moderate compression deformity of T8, acute.    2.Right mid ureteral stone without hydronephrosis or hydroureter.   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 The fluid collection along the greater curvature of the stomach is slightly decreased in size. Otherwise postoperative changes are unchanged from 06/11/2021.   4.Resolved left pleural fluid.   5.A few scattered areas of groundglass opacities within the bilateral upper lobes, nonspecific. Differential include atelectasis or infectious/inflammatory process.      XR UPRIGHT AP/LAT T-SPINE    (Results Pending)         Interventions Received:  Medications  Administered in the ED   cefepime (MAXIPIME) 2 g in NS 100 mL IVPB minibag (has no administration in time range)   ondansetron (ZOFRAN) 2 mg/mL injection (4 mg Intravenous Given 06/21/21 2346)   vancomycin (VANCOCIN) 1,500 mg in NS 500 mL IVPB (has no administration in time range)   cefepime (MAXIPIME) 2 g in NS 100 mL IVPB minibag (2 g Intravenous New Bag/New Syringe 06/21/21 2347)   iopamidol (ISOVUE-370) 76% infusion (50 mL Intravenous Given 06/21/21 2238)   iopamidol (ISOVUE-370) 76% infusion (50 mL Intravenous Given 06/21/21 2240)   iopamidol (ISOVUE-370) 76% infusion (100 mL Intravenous Given 06/21/21 2242)       Plan:  Medicine to admit    After a thorough discussion of the patient including presentation, ED course, and review of above information I have assumed care of Edwinna Areola from Dr. Wandra Feinstein at 23:57  --------------------------------------------------------------------------------------------------------------------------------  Course:      Patient remained stable throughout remainder of ED course under my care. No further interventions were required. Patient was awaiting bed in the hospital and was taken to the room upon availability.     Disposition:  Admitted    Impression:  Clinical Impression   Dizziness (Primary)   T8 vertebral fracture (CMS HCC)   Tremor   Pneumonia       Parts of this patients chart were completed in a retrospective fashion due to simultaneous direct patient care activities in the Emergency Department.   This note was partially generated using MModal Fluency Direct system, and there may be some incorrect words, spellings, and punctuation that were not noted in checking the note before saving.      Samantha Crimes, MD 06/21/2021, 23:57   PGY 2 - Emergency Medicine  [facility]

## 2021-06-21 NOTE — Consults (Signed)
[facility]  Department of Orthopaedics  H&P / Consult Note  Date of Service: 06/21/2021      Patient: [name]  MRN: [id number]  DOB: 1945/05/17    Admission Date: 06/21/2021  Ortho Staff: Dr. Layne Benton, MD  Requesting Service/Staff: ED    PCP:   [facility]    Consult Reason:   T8 compression fx    HPI:   [name] is a  76 y.o. male with dizziness, confusion, ABD pain. Patient found to have T8 fx incidentally on CT scan, ortho consulted. After questions, patient thinks he has had some back pain, mostly lower back over past three weeks ever since sleeping on the uncomfortable [facility] beds. Denies falls or trauma. Denies any numbness or paresthesias (other than baseline neuropathy), pain or injury to other extremities, or any other symptoms or complaints.    Denies any recent fever, chills, CP, SOB, N/V/D, change in urinary or bowel habits, or any other symptoms or complaints.    PMH:  - Prostate cancer  - GERD  - Pancreatic mass  - HLD  - HTN  - Hypothyroid  - H/o PE  - HIV    PSH:  - Colectomy   - I&D R hip (septic arthritis)  - "C-spine surgery"  - Hernia repair  - No h/o problems with anaesthesia    ALLERGIES:  - NKDA    HOME MEDICATIONS:  - Anticoagulation = Eliquis    Prior to Admission Medications   Prescriptions Last Dose Informant Patient Reported? Taking?   BIOTIN ORAL  Patient Yes No   Sig: Take 1 Tablet by mouth Once a day   Blood Sugar Diagnostic (ACCU-CHEK GUIDE TEST STRIPS) Strip   No No   Sig: 1 Strip Four times a day - before meals and bedtime   MULTIVITAMIN ORAL  Patient Yes No   Sig: Take 1 Tablet by mouth Once a day   POTASSIUM ORAL  Patient Yes No   Sig: Take 1 Tablet by mouth Once a day   acetaminophen (TYLENOL) 500 mg Oral Tablet   Yes No   Sig: Take 2 Tablets (1,000 mg total) by mouth Every 4 hours as needed for Pain   amLODIPine (NORVASC) 5 mg Oral Tablet   Yes No   Sig: Take 1 Tablet (5 mg total) by mouth Once a day   apixaban (ELIQUIS) 2.5 mg  Oral Tablet   No No   Sig: Take 1 Tablet (2.5 mg total) by mouth Twice daily for 90 days   atorvastatin (LIPITOR) 80 mg Oral Tablet  Patient Yes No   Sig: Take 0.5 Tablets (40 mg total) by mouth Every morning with breakfast   cetirizine (ZYRTEC) 10 mg Oral Tablet  Patient Yes No   Sig: Take 1 Tablet (10 mg total) by mouth Once a day   cyanocobalamin (VITAMIN B 12) 1,000 mcg Oral Tablet   Yes No   Sig: Take 1 Tablet (1,000 mcg total) by mouth Every morning   elviteg-cob-emtri-tenof ALAFEN (GENVOYA) 150-150-200-10 mg Oral Tablet  Patient Yes No   Sig: Take 1 Tablet by mouth Once a day   flash glucose scanning reader (FREESTYLE LIBRE 2 READER) Does not apply Misc   No No   Sig: Use as directed with FreeStyle Libre 2 sensors   flash glucose sensor (FREESTYLE LIBRE 2 SENSOR) Does not apply Kit   No No   Sig: To check blood glucose continuously. Change every 14 days  flash glucose sensor (FREESTYLE LIBRE 2 SENSOR) Does not apply Kit   No No   Sig: To check blood glucose continuously. Change every 14 days   fluconazole (DIFLUCAN) 200 mg Oral Tablet   No No   Sig: Take 2 Tablets (400 mg total) by mouth Once a day for 13 days   gabapentin (NEURONTIN) 300 mg Oral Capsule   Yes No   Sig: Take 1 Capsule (300 mg total) by mouth Three times a day   insulin NPH isoph U-100 human 100 unit/mL Subcutaneous Suspension   No No   Sig: Inject 15 Units under the skin Every morning before breakfast   insulin NPH isoph U-100 human 100 unit/mL Subcutaneous Suspension   No No   Sig: Inject 25 Units under the skin Every evening after dinner   insulin lispro 100 units/mL Subcutaneous Injectable   No No   Sig: Inject 0-12 Units under the skin Four times a day as needed for Other   insulin regular human (HUMULIN R) 100 unit/mL Injection Solution   No No   Sig: Inject 12 Units under the skin Three times a day   lancets (FREESTYLE LANCETS) 28 gauge Misc   No No   Sig: Use to test blood sugar 4 times a day - before meals and bedtime   levothyroxine  (SYNTHROID) 88 mcg Oral Tablet  Patient Yes No   Sig: Take 1 Tablet (88 mcg total) by mouth Every morning   melatonin 5 mg Oral Tablet   Yes No   Sig: Take 1 Tablet (5 mg total) by mouth Every night   meropenem 1 g in NS 100 mL infusion   No No   Sig: Infuse 1 g into a venous catheter Every 8 hours for 14 days Mix and infuse per policy of [facility].   pantoprazole (PROTONIX) 40 mg Oral Tablet, Delayed Release (E.C.)  Patient Yes No   Sig: Take 1 Tablet (40 mg total) by mouth Twice daily 30 minutes before a meal   prochlorperazine (COMPAZINE) 10 mg/2 mL (5 mg/mL) Injection Solution   No No   Sig: Infuse 2 mL (10 mg total) into a venous catheter Every 6 hours as needed   promethazine (PHENERGAN) 12.5 mg Oral Tablet   No No   Sig: Take 0.5 Tablets (6.25 mg total) by mouth Every 6 hours as needed for Nausea/Vomiting (refractory to other medications)   sennosides-docusate sodium (SENOKOT-S) 8.6-50 mg Oral Tablet   No No   Sig: Take 1 Tablet by mouth Twice daily   vancomycin 1,250 mg in NS 12.5 mL infusion   No No   Sig: 1,250 mg Every 24 hours Mix and infuse per policy of [facility].      Facility-Administered Medications: None       SH:   - Tobacco = Denies  - Alcohol = Denies  - Drugs = Denies    FH:  - No family h/o anesthesia problems per patient's knowledge  - No family h/o DVT's per patient's knowledge     ROS:   Per HPI, otherwise negative    PHYSICAL EXAM:              FOCUSED SPINE EXAM:    Palpation Tenderness: L spine TTP  Step-Off: None  Wounds/Lacerations/Deformities: None  Perianal Sensation (S4/5): intact  Rectal Tone: normal  Bulbocavernosus Reflex (S2/3/4): not tested  Hoffman's: negative  Clonus: no beats  Babinski: downgoing bilaterally    Sensation Testing:  Upper Ext AC joint (C4) Lat arm (C5) Dor Thumb (C6) 3rd Fing (C7) 5th Fing (C8) Ulnar FA (T1)   Right 2/2 2/2 2/2 2/2 2/2 2/2   Left 2/2 2/2 2/2 2/2 2/2 2/2     Lower Ext Groin (L1) Mid Thigh (L2) Knee (L3) Med Mal (L4)  Dors Foot (L5) Lat Heel (S1) Pop Fos (S2)   Right 2/2 2/2 2/2 2/2 2/2 2/2 2/2   Left 2/2 2/2 2/2 2/2 2/2 2/2 2/2     Motor Testing:  Upper Ext Arm Abd (C4/C5) Elb Flex (C5) Wrist Ext (C6) Elb Ext (C7) 3rd DIP Flex (C8) 5th Dig Abd (T1)   Right 5/5 5/5 5/5 5/5 5/5 5/5   Left 5/5 5/5 5/5 5/5 5/5 5/5     Lower Ext Hip Flex (L2) Knee Ext (L3)  Ankle DF (L4) GT Ext (L5) Ankle PF (S1)   Right 4+/5 5/5 5/5 5/5 5/5   Left 5/5 5/5 5/5 5/5 5/5     Reflexes:    Upper Ext. Biceps (C5) BR (C6) Triceps (C7)   Right 1+ 1+ 1+   Left 1+ 1+ 1+     Lower Ext. Patellar (L4) Achilles (S1)   Right 2+ 1+   Left 2+ 1+         IMAGING:     - Imaging obtained and reviewed, significant for: T8 compression fx, present on imaging from 5/12, worsened appearance     PERTINENT LABS:   CBC (Last 24 Hours):    Recent Results last 24 hours     06/21/21  1709 06/21/21  2155   WBC 14.5* 13.3*   HGB 7.8* 7.7*   HCT 25.7* 24.6*   MCV 83.7 82.0   PLTCNT 503* 511*     BMP (Last 24 Hours):    Recent Results last 24 hours     06/21/21  1709 06/21/21  2155   SODIUM 147* 142   POTASSIUM 3.6 3.1*   CHLORIDE 109 108   CO2 26 26   BUN 19 19   CREATININE 0.82 0.89   CALCIUM 9.5 9.7   GLUCOSENF 112 106       ASSESSMENT:  76 y.o. male  with T8 compression fx      PLAN/RECOMMENDATIONS:     - Please place "ortho consult" in Epic.  - Upright T-spine XR ordered   - Will follow results    --    Barry Brunner, MD  Resident, PGY-2  Department of Orthopaedics  Pager [phone]  06/21/2021 23:47      ADDENDUM:    S: 76 y.o. male with an incidentally found T8 compression fracture, who presented to [facility] for SOB.  Jaryn does state that he's had about 3 weeks of thoracic back pain but denies any trauma.  He denies any fevers or chills.  He's being admitted to [facility] for pneumonia.    O: Blood pressure 130/71, pulse 99, temperature 36.5 C (97.7 F), resp. rate 16, height 1.778 m ('5\' 10"'$ ), weight 73.3 kg (161 lb 9.6 oz), SpO2 96 %.    TTP in the midline mid thoracic  spine.    MOTOR   D B WE T FF HI  Right  '5 5 5 5 5 5  '$ Left '5 5 5 5 5 5     '$ HF Q TA EHL G  Right '5 5 5 5 5  '$ Left '5 5 5 5 5    '$ SENSORY    SILT in  the C5-T1 and L2-S1 dermatomes bilaterally    No hyperreflexia  Negative Hoffman's    CBC (Last 24 Hours):    Recent Results last 24 hours     06/21/21  1709 06/21/21  2155   WBC 14.5* 13.3*   HGB 7.8* 7.7*   HCT 25.7* 24.6*   MCV 83.7 82.0   PLTCNT 503* 511*         Basic Metabolic Profile    Lab Results   Component Value Date/Time    SODIUM 142 06/21/2021 09:55 PM    POTASSIUM 3.1 (L) 06/21/2021 09:55 PM    CHLORIDE 108 06/21/2021 09:55 PM    CO2 26 06/21/2021 09:55 PM    ANIONGAP 8 06/21/2021 09:55 PM    Lab Results   Component Value Date/Time    BUN 19 06/21/2021 09:55 PM    CREATININE 0.89 06/21/2021 09:55 PM    GLUCOSEFAST 165 (H) 03/17/2021 09:17 AM    GLUCOSENF 106 06/21/2021 09:55 PM        Lab Results   Component Value Date    INR 1.37 (H) 06/21/2021       Imaging: Imaging reviewed and there is a T8 compression fracture that can be seen on imaging from 06/11/21 but has compressed in the interim.  Uprights show stable spinal alignment.    A: 76 y.o. male with a T8 compression fracture who is being admitted to [facility] due to pneumonia.      P: Plan for non-operative management of the T8 compression fracture.  Mobilize as tolerated.  Will see back in non-op spine clinic in 2 weeks.  Metabolic bone consult placed.  Ortho to sign off at this time.        Sharee Pimple, MD  Department of Orthopaedics, PGY-3  Pager: [facility]      I saw and examined the patient.  I reviewed the resident's note.  I agree with the findings and plan of care as documented in the resident's note.  Any exceptions/additions are edited/noted.    Has had 3-4 weeks of back pain. No trauma. Pain is gradually improving. No deficits.  Imaging incidentally found T8 compression fx, I think also some wedging of T7  Has h/o prostate cancer  Discussed with patient and family that given history,  difficult to know if the fracture is pathologic from metastatic dz or just from osteoporosis  Discussed that best way to determine this would be with IR biopsy of vertebra  They would like to hold off for now, understand the risk for delay in diagnosis/treatment or failure of diagnosis/treatment  If they change mind, will contact us and we can get set up for Bx while inpatient  Will arrange outpatient f/u in 2 weeks with repeat XR TSP ap/lat    Terald Sleeper, MD

## 2021-06-21 NOTE — Pharmacy (Signed)
[place] / Department of Pharmaceutical Services  Therapeutic Drug Monitoring: Vancomycin  06/21/2021      Patient name: [name], [name]  Date of Birth:  1945-10-19    Actual Weight:  Weight: 73.3 kg (161 lb 9.6 oz) (06/21/21 2123)     BMI:  BMI (Calculated): 23.24 (06/21/21 2123)      Date RPh Current regimen (including mg/kg) Indication &  Organism AUC or trough based dosing Target Levels^ SCr (mg/dL) CrCl* (mL/min) Infectious Laboratory Markers (as applicable)   Measured level(s)   (mcg/mL) Calculated AUC (if AUC based monitoring) Plan & predicted AUC/trough if initial dosing (including when levels are due) Comments   5/22 mc Vancomycin '1500mg'$  (20 mg/kg) x1 empiric   0.89 74 WBC: 13.3  Procal:  CRP:   Dose x1 in ED. Follow up for continuation of therapy and consider levels if continued for >48h.                                                                                                    Katherina Mires levels depends on dosing and monitoring method, AUC vs. trough based. For AUC based dosing units are mg*h/L. For trough based dosing units are mcg/mL.     *Creatinine clearance is estimated by using the Cockcroft-Gault equation for adult patients and the Carol Ada for pediatric patients.    The decision to discontinue vancomycin therapy will be determined by the primary service.  Please contact the pharmacist with any questions regarding this patient's medication regimen.

## 2021-06-21 NOTE — ED Attending Handoff Note (Signed)
23:57   one day dizziness, dysarthria.   t8 compression fx on scan  Ct head negative.   Likely medicine  HIV  Upper and lower ext tremor

## 2021-06-21 NOTE — ED Nurses Note (Signed)
Pt states feels disoriented, difficulty forming words-- symptoms started yesterday  Assisted to stretcher and placed on continuous cardiac and pulse ox monitoring with cyclic BP's  Wife at bedside and call bell with in reach

## 2021-06-21 NOTE — ED Attending Note (Signed)
[facility] - Emergency Department  Primary Attending Note    Name: [name]  Age and Gender: 76 y.o. male  Date of Birth: [birth year]  Date of Service: 06/21/2021   MRN: [id number]  PCP: [facility]    I was physically present and directly supervised this patients care. Patient seen and examined with the resident, D. Dow, and history and exam reviewed. Key elements in addition to and/or correction of that documentation are as follows:    Chief Complaint   Patient presents with   . 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 Today reports shaking, dizziness, and confusion. Wife reports FS 107 at home.  A&O x3 at time of arrival. Denies fever/chills. Pt on blood thinners. Was told he had a stroke while hospitalized in February. FS 124 in triage. Denies chest pain/shortness of breath. Endorses generalized abdominal pain w/ mild distention.       HPI:  [name] is a 76 y.o. male presenting with one day hx of dysarthria, dizziness and BLE weakness. + Nausea. No fevers or chills. HIV + and with pancreatic neoplasm. On TPN for several months since pancreatic mass.      Further historical details can be found in the ED Primary note.    Pertinent past medical, past surgical, family, social histories, as well as home medications and allergies, were reviewed with patient and EMR review. ED Primary note/EMR for full details.    Past Medical History:   Diagnosis Date   . Cancer (CMS [facility])     prostate   . Deep vein thrombosis (DVT) (CMS HCC)     right leg   . Esophageal reflux    . H/O hearing loss    . High cholesterol    . 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 HTN (hypertension)    . 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 Hyperlipidemia     "borderline"   . Hypothyroidism    . MRSA (methicillin resistant staph aureus) culture positive  01/02/2021    MRSA left groin abscess 01/03/21   . MRSA (methicillin resistant staph aureus) culture positive 01/02/2021    MRSA blood 01/02/21   . Pulmonary embolism (CMS HCC) 03/31/2021   . Thyroid disorder    . Wears glasses              Objective:  ED Triage Vitals [06/21/21 2123]   BP (Non-Invasive) 131/78   Heart Rate (!) 103   Respiratory Rate 18   Temperature 36.2 C (97.2 F)   SpO2 93 %   Weight 73.3 kg (161 lb 9.6 oz)   Height 1.778 m ('5\' 10"'$ )       BP 125/65   Pulse (!) 104   Temp 37.1 C (98.8 F)   Resp 17   Ht 1.778 m ('5\' 10"'$ )   Wt 72.6 kg (160 lb 0.9 oz)   SpO2 96%   BMI 22.97 kg/m           Physical Exam:  76 y.o. male who appears oriented to self and issues, but not to history or temporal events. Appears chronically ill, but nontoxic. Pale color, no cyanosis. Notes weakness of BLE and tremor, but is able to stand at bedside and ambulate to toilet in room with observation. No alteration in  light touch sensation   At times speech is weak, and has some difficulty with word-finding  No abdominal tenderness    I have seen and physically examined the patient.  I agree with the physical exam as documented in the ED Primary note.    Labs:   Labs Ordered/Reviewed   BASIC METABOLIC PANEL - Abnormal; Notable for the following components:       Result Value    POTASSIUM 3.1 (*)     All other components within normal limits   PT/INR - Abnormal; Notable for the following components:    PROTHROMBIN TIME 16.0 (*)     INR 1.37 (*)     All other components within normal limits    Narrative:     Coumadin therapy INR range for Conventional Anticoagulation is 2.0 to 3.0 and for Intensive Anticoagulation 2.5 to 3.5.   CBC WITH DIFF - Abnormal; Notable for the following components:    WBC 13.3 (*)     RBC 3.00 (*)     HGB 7.7 (*)     HCT 24.6 (*)     MCH 25.7 (*)     RDW-CV 18.6 (*)     PLATELETS 511 (*)     All other components within normal limits   URINALYSIS, MICROSCOPIC - Abnormal; Notable for the following  components:    GRANULAR CASTS 2 (*)     All other components within normal limits   MANUAL DIFF AND MORPHOLOGY-SYSMEX - Abnormal; Notable for the following components:    LYMPHOCYTE # 7.18 (*)     ACANTHOCYTES (SPUR CELL) 2+/Moderate (*)     ANISOCYTOSIS 2+/Moderate (*)     HOWELL JOLLY BODIES Present (*)     SPHEROCYTES 2+/Moderate (*)     TARGET CELLS 2+/Moderate (*)     All other components within normal limits   POC BLOOD GLUCOSE (RESULTS) - Abnormal; Notable for the following components:    GLUCOSE, POC 124 (*)     All other components within normal limits   TROPONIN-I (FOR ED ONLY) - Normal   PTT (PARTIAL THROMBOPLASTIN TIME) - Normal    Narrative:     Therapeutic range for unfractionated heparin is 60-100 seconds.   URINALYSIS, MACROSCOPIC - Normal   ADULT ROUTINE BLOOD CULTURE, SET OF 2 BOTTLES (BACTERIA AND YEAST)   ADULT ROUTINE BLOOD CULTURE, SET OF 2 BOTTLES (BACTERIA AND YEAST)   CBC/DIFF    Narrative:     The following orders were created for panel order CBC/DIFF.  Procedure                               Abnormality         Status                     ---------                               -----------         ------                     CBC WITH URKY[[id number]]                Abnormal            Final result  MANUAL DIFF AND MORPHOLO.Marland KitchenMarland Kitchen[[id number]]  Abnormal            Final result                 Please view results for these tests on the individual orders.   URINALYSIS, MACROSCOPIC AND MICROSCOPIC W/CULTURE REFLEX    Narrative:     The following orders were created for panel order URINALYSIS, MACROSCOPIC AND MICROSCOPIC W/CULTURE REFLEX.  Procedure                               Abnormality         Status                     ---------                               -----------         ------                     URINALYSIS, MACROSCOPIC[[id number]]      Normal              Final result               URINALYSIS, MICROSCOPIC[[id number]]      Abnormal            Final result                 Please  view results for these tests on the individual orders.   PERFORM POC WHOLE BLOOD GLUCOSE   PERFORM POC WHOLE BLOOD GLUCOSE   PERFORM POC WHOLE BLOOD GLUCOSE   PERFORM POC WHOLE BLOOD GLUCOSE       Imaging:  XR AP MOBILE CHEST   Final Result by Edi, Radresults In (05/23 0017)   1.Bibasilar atelectasis.   2.Compression deformity of T8.      CT ANGIO CAROTID-EXTRACRANIAL (NECK) W IV CONTRAST   Preliminary Result by Edi, Radresults In (05/22 2329)   Widely patent extracranial arterial vasculature.               CT ANGIO INTRACRANIAL W/WO IV CONTRAST   Preliminary Result by Edi, Radresults In (05/22 2328)   1.Mild atherosclerotic changes. No severe stenosis, occlusion, or aneurysm.   2.No acute intracranial process.            CT CHEST ABDOMEN PELVIS W IV CONTRAST   Final Result by Edi, Radresults In (05/23 0017)   1.Moderate compression deformity of T8, acute.    2.Right mid ureteral stone without hydronephrosis or hydroureter.   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 The fluid collection along the greater curvature of the stomach is slightly decreased in size. Otherwise postoperative changes are unchanged from 06/11/2021.   4.Resolved left pleural fluid.   5.A few scattered areas of groundglass opacities within the bilateral upper lobes, nonspecific. Differential include atelectasis or infectious/inflammatory process.        I have reviewed ONLY imaging occurring while patient in ED and agree with interpretation.      MDM/Course:  [name] is a 76 y.o. male who presented with weakness, dizziness, and difficulties with word-finding and hx of pancreatic malignancy  And HIV. Initial concern for CVA, but this seems more metabolic, infectious  Or malignancy related. T8 compression fx noted on CXR. Spine consulted  for this  Labs obtained, including blood cultures. CTA brain with intra and extracranial vessels did not reveal LVO. CT of CAP shows postoperative changes and  possible PNA. Antibiotics started for broad coverage and medicine consulted for admit for further treatment and evaluation.              MDM    Medications given:  Medications Administered in the ED   cefepime (MAXIPIME) 2 g in NS 100 mL IVPB minibag (has no administration in time range)   ondansetron (ZOFRAN) 2 mg/mL injection (4 mg Intravenous Given 06/21/21 2346)   vancomycin (VANCOCIN) 1,500 mg in NS 500 mL IVPB (has no administration in time range)   cefepime (MAXIPIME) 2 g in NS 100 mL IVPB minibag (2 g Intravenous New Bag/New Syringe 06/21/21 2347)   iopamidol (ISOVUE-370) 76% infusion (50 mL Intravenous Given 06/21/21 2238)   iopamidol (ISOVUE-370) 76% infusion (50 mL Intravenous Given 06/21/21 2240)   iopamidol (ISOVUE-370) 76% infusion (100 mL Intravenous Given 06/21/21 2242)       Clinical Impression:     New tremor  Hypokalemia  Compression deformity of T8    At the end of my shift care of [name] was checked out to Dr. Luiz Ochoa following a discussion of the patient's course. Please refer to the attending course note for further details of the patient's ED course.    Disposition:   Disposition: Admitted      Future Appointments scheduled in Epic:   Future Appointments   Date Time Provider [place]   06/23/2021  3:00 PM Nicola Police, MD [place] [facility]   07/14/2021  2:45 PM Schellinger, Chipper Oman, MD [place] [facility]   08/12/2021  9:30 AM Marvis Repress, APRN [facility], [facility]   09/29/2021  8:00 AM Harold Barban, MD [place]       Parts of this patients chart were completed in a retrospective fashion due to simultaneous direct patient care activities in the Emergency Department.       Darnelle Spangle, MD, [place]  Professor of Emergency Medicine  [facility] Department of Emergency Medicine

## 2021-06-21 NOTE — ED Provider Notes (Signed)
[facility] - Emergency Department  ED Primary Provider Note  History of Present Illness   Chief Complaint   Patient presents with   . 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 Today reports shaking, dizziness, and confusion. Wife reports FS 107 at home.  A&O x3 at time of arrival. Denies fever/chills. Pt on blood thinners. Was told he had a stroke while hospitalized in February. FS 124 in triage. Denies chest pain/shortness of breath. Endorses generalized abdominal pain w/ mild distention.     [name] is a 76 y.o. male who had concerns including Dizziness.  Arrival: The patient arrived by Private Vehicle  {Use the HPI, ROS, Phys Exam, & MDM tabs at the top of the note composer to access the [employer] for the respective sections as needed. As of Jan 31, 2021 you are only required to have a "medically appropriate" History, ROS, and PE for billing purposes. Do not modify this italicized text, it will disappear upon signing your note:123}  HPI  Review of Systems   Pertinent positive and negative ROS as per HPI.  Historical Data   History Reviewed This Encounter:      Physical Exam   ED Triage Vitals [06/21/21 2123]   BP (Non-Invasive) 131/78   Heart Rate (!) 103   Respiratory Rate 18   Temperature 36.2 C (97.2 F)   SpO2 93 %   Weight 73.3 kg (161 lb 9.6 oz)   Height 1.778 m ('5\' 10"'$ )     Physical Exam  Patient Data   {Click here to open the ED Workup Activity for clinical data review *This link will automatically disappear upon signing your note*:123}  Labs Ordered/Reviewed   POC BLOOD GLUCOSE (RESULTS) - Abnormal; Notable for the following components:       Result Value    GLUCOSE, POC 124 (*)     All other components within normal limits   ADULT ROUTINE BLOOD CULTURE, SET OF 2 BOTTLES (BACTERIA AND YEAST)   ADULT ROUTINE BLOOD CULTURE, SET OF 2 BOTTLES (BACTERIA AND YEAST)   CBC/DIFF     Narrative:     The following orders were created for panel order CBC/DIFF.  Procedure                               Abnormality         Status                     ---------                               -----------         ------                     CBC WITH BJYN[[id number]]                                                                 Please view results for these tests on the individual orders.   BASIC METABOLIC PANEL   TROPONIN-I (FOR  ED ONLY)   PT/INR   PTT (PARTIAL THROMBOPLASTIN TIME)   CBC WITH DIFF   URINALYSIS, MACROSCOPIC AND MICROSCOPIC W/CULTURE REFLEX    Narrative:     The following orders were created for panel order URINALYSIS, MACROSCOPIC AND MICROSCOPIC W/CULTURE REFLEX.  Procedure                               Abnormality         Status                     ---------                               -----------         ------                     URINALYSIS, MACROSCOPIC[[id number]]                                                     URINALYSIS, MICROSCOPIC[[id number]]                                                       Please view results for these tests on the individual orders.   URINALYSIS, MACROSCOPIC   URINALYSIS, MICROSCOPIC     No orders to display     Medical Decision Making   ED clinical impression missing, please click on the following link to go to the [facility] *** and enter all problems addressed during this encounter then refresh the note prior to signing.  {Be sure to fill out the MDM SmartBlock in Notewriter to the left. Do not modify this italicized text, it will disappear upon signing your note:123}  MDM  ED Course as of 06/21/21 2243   Mon Jun 21, 2868   3370 76 year old male with past medical history of HIV and pancreatic neoplasm presenting for 1 day of dysarthria, dizziness, and bilateral lower extremity weakness.  Patient states the symptoms had an acute onset yesterday morning.  Denies worsening or improvement of symptoms since yesterday.  Denies fever, chest pain,  dyspnea, or sensory disturbances.  Patient currently on TPN.  On examination patient mildly hypertensive 151/72, heart rate 103, saturating 95% on room air.  Generalized weakness of bilateral lower extremities.  No focal weakness or neuro deficits.  Patient does appear to have word-finding difficulties and dysarthria.   2242 WBC(!): 13.3            ED clinical impression missing, please click on the following link to go to the [employer] *** and enter all problems addressed during this encounter then refresh the note prior to signing.    Disposition: Data Unavailable  {Critical Care Time (Optional):37527}     {Remember to refresh your note prior to signing. Use Control + R60 or click the refresh button at the bottom of the note. This reminder text will automatically disappear when you sign your note.:123}

## 2021-06-21 NOTE — ED Nurses Note (Signed)
Transported to CT via stretcher by EDT

## 2021-06-22 ENCOUNTER — Inpatient Hospital Stay (HOSPITAL_COMMUNITY): Payer: 59

## 2021-06-22 ENCOUNTER — Ambulatory Visit (HOSPITAL_COMMUNITY): Payer: Self-pay

## 2021-06-22 DIAGNOSIS — S22060A Wedge compression fracture of T7-T8 vertebra, initial encounter for closed fracture: Secondary | ICD-10-CM

## 2021-06-22 DIAGNOSIS — Z9081 Acquired absence of spleen: Secondary | ICD-10-CM

## 2021-06-22 DIAGNOSIS — N201 Calculus of ureter: Secondary | ICD-10-CM

## 2021-06-22 DIAGNOSIS — I10 Essential (primary) hypertension: Secondary | ICD-10-CM

## 2021-06-22 DIAGNOSIS — R9431 Abnormal electrocardiogram [ECG] [EKG]: Secondary | ICD-10-CM

## 2021-06-22 DIAGNOSIS — Z8546 Personal history of malignant neoplasm of prostate: Secondary | ICD-10-CM

## 2021-06-22 DIAGNOSIS — J189 Pneumonia, unspecified organism: Secondary | ICD-10-CM | POA: Diagnosis present

## 2021-06-22 DIAGNOSIS — M438X4 Other specified deforming dorsopathies, thoracic region: Secondary | ICD-10-CM

## 2021-06-22 DIAGNOSIS — R42 Dizziness and giddiness: Secondary | ICD-10-CM

## 2021-06-22 DIAGNOSIS — J9811 Atelectasis: Secondary | ICD-10-CM

## 2021-06-22 DIAGNOSIS — E039 Hypothyroidism, unspecified: Secondary | ICD-10-CM

## 2021-06-22 DIAGNOSIS — Z79899 Other long term (current) drug therapy: Secondary | ICD-10-CM

## 2021-06-22 DIAGNOSIS — J9601 Acute respiratory failure with hypoxia: Secondary | ICD-10-CM

## 2021-06-22 DIAGNOSIS — Z9889 Other specified postprocedural states: Secondary | ICD-10-CM

## 2021-06-22 DIAGNOSIS — S22060D Wedge compression fracture of T7-T8 vertebra, subsequent encounter for fracture with routine healing: Secondary | ICD-10-CM

## 2021-06-22 DIAGNOSIS — M4854XA Collapsed vertebra, not elsewhere classified, thoracic region, initial encounter for fracture: Secondary | ICD-10-CM

## 2021-06-22 DIAGNOSIS — I672 Cerebral atherosclerosis: Secondary | ICD-10-CM

## 2021-06-22 DIAGNOSIS — Z452 Encounter for adjustment and management of vascular access device: Secondary | ICD-10-CM

## 2021-06-22 DIAGNOSIS — E1142 Type 2 diabetes mellitus with diabetic polyneuropathy: Secondary | ICD-10-CM

## 2021-06-22 DIAGNOSIS — M47816 Spondylosis without myelopathy or radiculopathy, lumbar region: Secondary | ICD-10-CM

## 2021-06-22 DIAGNOSIS — R531 Weakness: Secondary | ICD-10-CM

## 2021-06-22 DIAGNOSIS — R918 Other nonspecific abnormal finding of lung field: Secondary | ICD-10-CM

## 2021-06-22 DIAGNOSIS — R471 Dysarthria and anarthria: Secondary | ICD-10-CM

## 2021-06-22 DIAGNOSIS — E441 Mild protein-calorie malnutrition: Secondary | ICD-10-CM

## 2021-06-22 DIAGNOSIS — R52 Pain, unspecified: Secondary | ICD-10-CM

## 2021-06-22 DIAGNOSIS — Z21 Asymptomatic human immunodeficiency virus [HIV] infection status: Secondary | ICD-10-CM

## 2021-06-22 DIAGNOSIS — E785 Hyperlipidemia, unspecified: Secondary | ICD-10-CM

## 2021-06-22 DIAGNOSIS — G4700 Insomnia, unspecified: Secondary | ICD-10-CM

## 2021-06-22 DIAGNOSIS — I700 Atherosclerosis of aorta: Secondary | ICD-10-CM

## 2021-06-22 LAB — PATH COMMENT: PATHOLOGIST INTERPRETATION: ABNORMAL — AB

## 2021-06-22 LAB — URINALYSIS, MACROSCOPIC
BILIRUBIN: NEGATIVE mg/dL
BLOOD: NEGATIVE mg/dL
COLOR: NORMAL
GLUCOSE: NEGATIVE mg/dL
KETONES: NEGATIVE mg/dL
LEUKOCYTES: NEGATIVE WBCs/uL
NITRITE: NEGATIVE
PH: 7.5 (ref 5.0–8.0)
PROTEIN: NEGATIVE mg/dL
SPECIFIC GRAVITY: 1.025 (ref 1.005–1.030)
UROBILINOGEN: NEGATIVE mg/dL

## 2021-06-22 LAB — CALCIUM: CALCIUM: 9.6 mg/dL (ref 8.8–10.2)

## 2021-06-22 LAB — URINALYSIS, MICROSCOPIC
GRANULAR CASTS: 2 /lpf — ABNORMAL HIGH (ref <=0)
HYALINE CASTS: 2 /lpf (ref <4.0)
RBCS: 1 /hpf (ref <6.0)
WBCS: 1 /hpf (ref <4.0)

## 2021-06-22 LAB — AST (SGOT): AST (SGOT): 24 U/L (ref 8–45)

## 2021-06-22 LAB — THYROID STIMULATING HORMONE WITH FREE T4 REFLEX: TSH: 1.246 u[IU]/mL (ref 0.430–3.550)

## 2021-06-22 LAB — ECG 12-LEAD
Atrial Rate: 100 {beats}/min
Calculated P Axis: 37 degrees
Calculated R Axis: 36 degrees
Ventricular rate: 100 {beats}/min

## 2021-06-22 LAB — ALK PHOS (ALKALINE PHOSPHATASE): ALKALINE PHOSPHATASE: 118 U/L — ABNORMAL HIGH (ref 45–115)

## 2021-06-22 LAB — ALBUMIN: ALBUMIN: 3 g/dL — ABNORMAL LOW (ref 3.4–4.8)

## 2021-06-22 LAB — POC BLOOD GLUCOSE (RESULTS)
GLUCOSE, POC: 108 mg/dl — ABNORMAL HIGH (ref 70–105)
GLUCOSE, POC: 98 mg/dL (ref 70–105)
GLUCOSE, POC: 104 mg/dL (ref 70–105)
GLUCOSE, POC: 114 mg/dL — ABNORMAL HIGH (ref 70–105)

## 2021-06-22 LAB — PARATHYROID HORMONE (PTH): PTH: 22.3 pg/mL (ref 8.5–77.0)

## 2021-06-22 LAB — C-REACTIVE PROTEIN(CRP),INFLAMMATION: CRP INFLAMMATION: 32.9 mg/L — ABNORMAL HIGH (ref <8.0)

## 2021-06-22 LAB — VITAMIN D 25 TOTAL: VITAMIN D, 25OH: 42 ng/mL (ref 30–100)

## 2021-06-22 MED ORDER — LEVOTHYROXINE 88 MCG TABLET
88.0000 ug | ORAL_TABLET | Freq: Every morning | ORAL | Status: DC
Start: 2021-06-22 — End: 2021-06-27
  Administered 2021-06-22 – 2021-06-27 (×6): 88 ug via ORAL
  Filled 2021-06-22 (×6): qty 1

## 2021-06-22 MED ORDER — WATER FOR INJECTION, STERILE INTRAVENOUS SOLUTION
INTRAVENOUS | Status: AC
Start: 2021-06-22 — End: 2021-06-23
  Filled 2021-06-22: qty 600

## 2021-06-22 MED ORDER — OXYCODONE 5 MG TABLET
5.0000 mg | ORAL_TABLET | ORAL | Status: AC
Start: 2021-06-22 — End: 2021-06-22
  Administered 2021-06-22: 5 mg via ORAL
  Filled 2021-06-22: qty 1

## 2021-06-22 MED ORDER — ELVITEG 150 MG-COB 150 MG-EMTRICIT 200 MG-TENOFO ALAFENAM 10 MG TABLET
1.0000 | ORAL_TABLET | Freq: Every day | ORAL | Status: DC
Start: 2021-06-22 — End: 2021-06-27
  Administered 2021-06-22 – 2021-06-27 (×6): 1 via ORAL
  Filled 2021-06-22 (×7): qty 1

## 2021-06-22 MED ORDER — CYANOCOBALAMIN (VIT B-12) 1,000 MCG TABLET
1000.0000 ug | ORAL_TABLET | Freq: Every morning | ORAL | Status: DC
Start: 2021-06-22 — End: 2021-06-27
  Administered 2021-06-22 – 2021-06-27 (×6): 1000 ug via ORAL
  Filled 2021-06-22 (×6): qty 1

## 2021-06-22 MED ORDER — PANTOPRAZOLE 40 MG TABLET,DELAYED RELEASE
40.0000 mg | DELAYED_RELEASE_TABLET | Freq: Two times a day (BID) | ORAL | Status: DC
Start: 2021-06-22 — End: 2021-06-27
  Administered 2021-06-22 – 2021-06-27 (×11): 40 mg via ORAL
  Filled 2021-06-22 (×11): qty 1

## 2021-06-22 MED ORDER — SODIUM CHLORIDE 0.9 % (FLUSH) INJECTION SYRINGE
2.0000 mL | INJECTION | INTRAMUSCULAR | Status: DC | PRN
Start: 2021-06-22 — End: 2021-06-27

## 2021-06-22 MED ORDER — PROMETHAZINE 12.5 MG TABLET
6.2500 mg | ORAL_TABLET | Freq: Four times a day (QID) | ORAL | Status: DC | PRN
Start: 2021-06-22 — End: 2021-06-26
  Filled 2021-06-22 (×6): qty 0.5

## 2021-06-22 MED ORDER — SODIUM CHLORIDE 0.9% FLUSH BAG - 250 ML
INTRAVENOUS | Status: DC | PRN
Start: 2021-06-22 — End: 2021-06-27

## 2021-06-22 MED ORDER — ACETAMINOPHEN 325 MG TABLET
975.0000 mg | ORAL_TABLET | Freq: Four times a day (QID) | ORAL | Status: DC | PRN
Start: 2021-06-22 — End: 2021-06-25
  Administered 2021-06-23 – 2021-06-24 (×2): 975 mg via ORAL
  Filled 2021-06-22 (×2): qty 3

## 2021-06-22 MED ORDER — SODIUM CHLORIDE 0.9 % (FLUSH) INJECTION SYRINGE
2.0000 mL | INJECTION | Freq: Three times a day (TID) | INTRAMUSCULAR | Status: DC
Start: 2021-06-22 — End: 2021-06-27
  Administered 2021-06-22: 5 mL
  Administered 2021-06-22: 0 mL
  Administered 2021-06-22 – 2021-06-23 (×5): 6 mL
  Administered 2021-06-24: 0 mL
  Administered 2021-06-24 – 2021-06-25 (×3): 6 mL
  Administered 2021-06-26: 0 mL
  Administered 2021-06-26 – 2021-06-27 (×3): 6 mL
  Administered 2021-06-27: 0 mL

## 2021-06-22 MED ORDER — POTASSIUM CHLORIDE ER 20 MEQ TABLET,EXTENDED RELEASE(PART/CRYST)
40.0000 meq | ORAL_TABLET | ORAL | Status: AC
Start: 2021-06-22 — End: 2021-06-22
  Administered 2021-06-22: 40 meq via ORAL
  Filled 2021-06-22: qty 2

## 2021-06-22 MED ORDER — APIXABAN 2.5 MG TABLET
2.5000 mg | ORAL_TABLET | Freq: Two times a day (BID) | ORAL | Status: DC
Start: 2021-06-22 — End: 2021-06-27
  Administered 2021-06-22 – 2021-06-27 (×11): 2.5 mg via ORAL
  Filled 2021-06-22 (×12): qty 1

## 2021-06-22 MED ORDER — CHOLECALCIFEROL (VITAMIN D3) 1,250 MCG (50,000 UNIT) CAPSULE
50000.0000 [IU] | ORAL_CAPSULE | ORAL | Status: DC
Start: 2021-06-22 — End: 2021-06-22
  Administered 2021-06-22: 50000 [IU] via ORAL
  Filled 2021-06-22: qty 1

## 2021-06-22 MED ORDER — MIRTAZAPINE 7.5 MG TABLET
7.5000 mg | ORAL_TABLET | Freq: Every evening | ORAL | Status: DC
Start: 2021-06-22 — End: 2021-06-27
  Administered 2021-06-22 – 2021-06-26 (×5): 7.5 mg via ORAL
  Filled 2021-06-22 (×5): qty 1

## 2021-06-22 MED ORDER — ATORVASTATIN 40 MG TABLET
40.0000 mg | ORAL_TABLET | Freq: Every morning | ORAL | Status: DC
Start: 2021-06-22 — End: 2021-06-27
  Administered 2021-06-22 – 2021-06-27 (×6): 40 mg via ORAL
  Filled 2021-06-22 (×6): qty 1

## 2021-06-22 MED ORDER — GABAPENTIN 300 MG CAPSULE
300.0000 mg | ORAL_CAPSULE | Freq: Three times a day (TID) | ORAL | Status: DC
Start: 2021-06-22 — End: 2021-06-23
  Administered 2021-06-22 – 2021-06-23 (×4): 300 mg via ORAL
  Filled 2021-06-22 (×4): qty 1

## 2021-06-22 MED ORDER — DEXTROSE 5% IN WATER (D5W) FLUSH BAG - 250 ML
INTRAVENOUS | Status: DC | PRN
Start: 2021-06-22 — End: 2021-06-27

## 2021-06-22 MED ORDER — MELATONIN 3 MG TABLET
5.0000 mg | ORAL_TABLET | Freq: Every evening | ORAL | Status: DC
Start: 2021-06-22 — End: 2021-06-23
  Administered 2021-06-22: 4.5 mg via ORAL
  Filled 2021-06-22: qty 2

## 2021-06-22 MED ORDER — SENNOSIDES 8.6 MG-DOCUSATE SODIUM 50 MG TABLET
1.0000 | ORAL_TABLET | Freq: Two times a day (BID) | ORAL | Status: DC
Start: 2021-06-22 — End: 2021-06-27
  Administered 2021-06-22 – 2021-06-25 (×8): 1 via ORAL
  Administered 2021-06-26 – 2021-06-27 (×3): 0 via ORAL
  Filled 2021-06-22 (×10): qty 1

## 2021-06-22 MED ORDER — CETIRIZINE 10 MG TABLET
10.0000 mg | ORAL_TABLET | Freq: Every day | ORAL | Status: DC
Start: 2021-06-22 — End: 2021-06-27
  Administered 2021-06-22 – 2021-06-27 (×6): 10 mg via ORAL
  Filled 2021-06-22 (×6): qty 1

## 2021-06-22 MED ORDER — HYDROXYZINE PAMOATE 25 MG CAPSULE
50.0000 mg | ORAL_CAPSULE | Freq: Four times a day (QID) | ORAL | Status: DC | PRN
Start: 2021-06-22 — End: 2021-06-27
  Administered 2021-06-22 – 2021-06-26 (×9): 50 mg via ORAL
  Filled 2021-06-22 (×9): qty 2

## 2021-06-22 MED ORDER — INSULIN LISPRO 100 UNIT/ML SUB-Q SSIP
0.0000 [IU] | INJECTION | Freq: Four times a day (QID) | SUBCUTANEOUS | Status: DC | PRN
Start: 2021-06-22 — End: 2021-06-27
  Administered 2021-06-23: 4 [IU] via SUBCUTANEOUS
  Administered 2021-06-24 – 2021-06-25 (×3): 2 [IU] via SUBCUTANEOUS
  Administered 2021-06-26: 4 [IU] via SUBCUTANEOUS
  Filled 2021-06-22: qty 12
  Filled 2021-06-22: qty 3
  Filled 2021-06-22: qty 18
  Filled 2021-06-22: qty 12
  Filled 2021-06-22 (×3): qty 6

## 2021-06-22 MED ORDER — INSULIN GLARGINE-YFGN (U-100) 100 UNIT/ML SUBCUTANEOUS SOLUTION
25.0000 [IU] | Freq: Every evening | SUBCUTANEOUS | Status: DC
Start: 2021-06-22 — End: 2021-06-27
  Administered 2021-06-22 – 2021-06-26 (×5): 25 [IU] via SUBCUTANEOUS
  Filled 2021-06-22 (×6): qty 25

## 2021-06-22 MED ORDER — OXYCODONE 5 MG TABLET
5.0000 mg | ORAL_TABLET | ORAL | Status: DC | PRN
Start: 2021-06-22 — End: 2021-06-27
  Administered 2021-06-22 – 2021-06-26 (×10): 5 mg via ORAL
  Filled 2021-06-22 (×11): qty 1

## 2021-06-22 MED ORDER — AMLODIPINE 5 MG TABLET
5.0000 mg | ORAL_TABLET | Freq: Every day | ORAL | Status: DC
Start: 2021-06-22 — End: 2021-06-27
  Administered 2021-06-22 – 2021-06-27 (×6): 5 mg via ORAL
  Filled 2021-06-22 (×6): qty 1

## 2021-06-22 NOTE — Care Management Notes (Signed)
[place] Management Initial Evaluation    Patient Name: [name]  Date of Birth: 01-Nov-1945  Sex: male  Date/Time of Admission: 06/21/2021  9:26 PM  Room/Bed: 707/A  Payor: MEDICARE / Plan: MEDICARE PART A AND B / Product Type: Medicare /   Primary Care Providers:  [place] (General)    Pharmacy Info:   Preferred Pharmacy       [id number]   [id number]   Phone: [phone]: [phone]   Hours: Not open 24 hours    [place] [address]    [address] [id number]   Phone: [phone]: [phone]   Hours: Not open 24 hours    [place]   [id number]   Phone: [phone]: [phone]   Hours: 24/7          Emergency Contact Info:   Extended Emergency Contact Information  Primary Emergency Contact: AElie Leppo Mobile Phone: [phone] Relation: Wife  Preferred language: English  Interpreter needed? No    History:   [name] Longmoreis a 76y.o., male, admitted for Pneumonia.     Height/Weight: 177.8 cm ('5\' 10"'$ ) / 72.6 kg (160 lb 0.9 oz)     LOS: 0 days   Admitting Diagnosis: Pneumonia [J18.9]    Assessment:      06/22/21 1345   Assessment Details   Assessment Type Admission   Date of Care Management Update 06/22/21   Date of Next DCP Update 06/25/21   Readmission   Is this a readmission? Yes   Is this a scheduled readmission? No   Number of days between last admission and this admission? 286  Were your symptoms the same as before? No   Did your support systems work?  Yes   Are you responsible for setting up/taking your own medications? Yes   Did you call your PCP/Home HCecil-BishopProvider  Yes   Were you able to attend your hospital follow up? Yes   If you had d/c resources set up proir to discharge what were they, and were you seen  by them? Yes   D/C resources set up prior to discharge Pt was discharged to [facility].   Did you have any barriers for a succesful discharge? Cost of meds, food, transportation No   Insurance Information/Type   Insurance type Medicare   Employment/Financial   Patient has Prescription Coverage?  Yes        Name of Insurance Coverage for Medications VA/Medicare   Financial Concerns none   Living Environment   Select an age group to open "lives with" row.  Adult   Lives With spouse  (Pt resides with his wife, ADanton [name])   Living Arrangements house   Able to Return to Prior Arrangements yes   Home Safety   Home Assessment: No Problems Identified   Home Accessibility ramps present at home   Care Management Plan   Discharge Planning Status initial meeting   Projected Discharge Date 06/24/21   Discharge plan discussed with: Patient;Spouse   CM will evaluate for rehabilitation potential yes   Patient choice offered to patient/family no   Form for patient choice reviewed/signed and on chart no   Discharge Needs Assessment  Equipment Currently Used at [employer], standard;cane, straight   Equipment Needed After Discharge other (see comments)  (Pending Evals.)   Community Agency Name(s) Pt is currently acitve with [facility].   Discharge Facility/Level of Care Needs Home vs Home with Home Health   Transportation Available car;family or friend will provide  (Pt's wife will provide transportation at time of discharge.)   Referral Information   Admission Type inpatient   Address Verified verified-no changes   Arrived From home or self-care   ADVANCE DIRECTIVES   Does the Patient have an Advance Directive? Yes, Patient Does Have Advance Directive for Healthcare Treatment   Type of Advance Directive Completed Medical Power of Attorney   Copy of Advance Directives in Chart? 0   Name of [place] or Healthcare Surrogate 1) Elwin Mocha; 2) [name]   Phone Number of MPOA or Healthcare Surrogate 1) [phone]; 2)  [phone]   Patient Requests Assistance in Having Advance Directive Notarized. N/A   LAY CAREGIVER    Appointed Lay Caregiver? I Decline     MSW met face to face with pt and wife, [name], to complete the initial assessment. Pt was admitted for Pneumonia. Orthopaedics consultation. ID Consult. PT/OT recommending home with assist.    Pt currently resides with his wife, [name], in a one story home. Pt reports there is a ramp to enter the home. Pt confirmed insurance. Pt also [facility]. Pt confirmed PCP and stated he was last seen three months ago. Pt stated when he was recently discharged from [facility], he was started with [facility] home health. Pt stated he continues to be active with [facility]. MPOA is currently on file. Pt's wife will provide transportation at time of discharge. Will continue to follow pt's discharge needs.         Discharge Plan:  Home vs home with Home Health      The patient will continue to be evaluated for developing discharge needs.     Case Manager: Arbie Cookey, [place]  Phone: [phone]

## 2021-06-22 NOTE — Progress Notes (Signed)
Hospitalist Cross Coverage Note:    Problem / Request:  Pain    Action:  Oxycodone 5 mg 1 time      Randie Heinz, DO  Assistant Professor   Department of Internal Medicine

## 2021-06-22 NOTE — Care Plan (Addendum)
[place]  Occupational Therapy Initial Evaluation    Patient Name: [name]  Date of Birth: 1945/10/12  Height: Height: 177.8 cm (_0 )  Weight: Weight: 72.6 kg (160 lb 0.9 oz)  Room/Bed: 707/A  Payor: MEDICARE / Plan: MEDICARE PART A AND B / Product Type: Medicare /     Assessment:   Pt presents with mild balance and endurance deficits. Pt reports not sleeping much the last couple days so feeling very tired this morning. Pt was able to complete functional mobility and basic ADLs with FWW and standby assist. Reported mild dizziness and increased lower abd pain with activity. Pt reports he will have assistance from his wife at d/c. Recommend home with assist and continued [place] when medically ready.      Discharge Needs:   Equipment Recommendation: none anticipated    Discharge Disposition: home with assist and home health    Plan:   Current Intervention: ADL retraining, balance training, endurance training, bed mobility training, strengthening, transfer training, therapeutic exercise    To provide Occupational therapy services minimum of 1x/week, until discharge.       The risks/benefits of therapy have been discussed with the patient/caregiver and he/she is in agreement with the established plan of care.       Subjective & Objective        06/22/21 1031   Rehab Session   Document Type evaluation   Total OT Minutes: 17   Patient Effort good   Symptoms Noted During/After Treatment dizziness;increased pain   General Information   Pertinent History of Current Functional Problem 12 YOM with a history of HIV, HTN, HLD, DMII with peripheral neuropathy, hypothyroidism who presents to the the emergency department for further evaluation of acute hypoxic respiratory failure, found to have Ground glass opacities concerning for pneumonia, also has an incidental finding of acute T8 compression fracture found on CT scan. Admit for further management of pneumonia and compression  fracture.   Medical Lines PIV Line   Respiratory Status room air   Existing Precautions/Restrictions fall precautions;full code   Pre Treatment Status   Pre Treatment Patient Status Patient supine in bed;Call light within reach;Telephone within reach;Sitter select activated;Nurse approved session   Support Present Pre Treatment  None   Communication Pre Treatment  Nurse   Mutuality/Individual Preferences   Individualized Care Needs OOB with assist of 1 and FWW   Living Environment   Lives With spouse   Living Arrangements house   Home Assessment: No Problems Identified   Home Accessibility ramps present at home   [place] has basement stairs but does not use them   Functional Level Prior   Ambulation 1 - assistive equipment   Transferring 1 - assistive equipment   Toileting 0 - independent   Bathing 1 - assistive equipment   Dressing 0 - independent   Eating 0 - independent   Communication 0 - understands/communicates without difficulty   Prior Functional Level Comment was d/c'd from [facility] 10 days ago, using FWW and shower chair, wife able to assist prn   Self-Care   Current Activity Tolerance moderate   Pain Assessment   Pre/Posttreatment Pain Comment 7/10 abd pain at end of session, nurse aware; reports his back is not bothering him today   Coping/Psychosocial Response Interventions   Plan Of Care Reviewed With patient   Cognitive Assessment/Interventions   Behavior/Mood Observations alert;cooperative   Orientation Status oriented x 4   Attention WNL/WFL  Follows Commands WNL   RUE Assessment   RUE Assessment WFL- Within Functional Limits   LUE Assessment   LUE Assessment WFL- Within Functional Limits   Mobility Assessment/Training   Mobility Comment pt ambulated 125 ft with FWW and SB A; mild dizziness and fatigue reported   Bed Mobility Assessment/Treatment   Bed Mobility, Assistive Device Head of Bed Elevated   Supine-Sit Independence stand-by assistance   Transfer Assessment/Treatment    Sit-Stand Independence stand-by assistance   Stand-Sit Independence stand-by assistance   Sit-Stand-Sit, Assist Device walker, front wheeled   Upper Body Dressing Assessment/Training   Position  sitting   Independence Level  set up required   Lower Body Dressing Assessment/Training   Position sitting   [place] Level  standby assist   Toileting Assessment/Training   Comment was using urinal in bed independently upon OT's arrival   Information systems manager   Comment with FWW   Sitting Balance: Static good balance   Sitting, Dynamic (Balance) fair + balance   Sit-to-Stand Balance fair balance   Standing Balance: Static fair + balance   Standing Balance: Dynamic fair balance   Post Treatment Status   Post Treatment Patient Status Patient sitting in bedside chair or w/c;Call light within reach;Telephone within reach;Engineer, petroleum Nurse   Care Plan Goals   OT Rehab Goals Occupational Therapy Goal;Occupational Therapy Goal 2;Occupational Therapy Goal 3;Occupational Therapy Goal 4   Occupational Therapy Goals   OT Goal, Date Established 06/22/21   OT Goal, Time to Achieve by discharge   OT Goal, Activity Type complete UB ADLs   OT Goal, Independence Level modified independence   Occupational Therapy Goal 2   OT Goal, Date Established 06/22/21   OT Goal, Time to Achieve by discharge   OT Goal, Activity Type complete LB ADLs   OT Goal, Independence Level modified independence    Occupational Therapy Goal 3   OT Goal, Date Established 06/22/21   OT Goal, Time to Achieve by discharge   OT Goal, Activity Type complete functional transfers and mobility in order to complete ADLs   OT Goal, Independence Level modified independence   Occupational Therapy Goal 4   OT Goal, Date Established 06/22/21   OT Goal, Time to Achieve by discharge   OT Goal, Activity Type demo fair+ or greater standing balance throughout OT  session   OT Goal, Independence Level modified independence   Planned Therapy Interventions, OT Eval   Planned Therapy Interventions ADL retraining;balance training;endurance training;bed mobility training;strengthening;transfer training;therapeutic exercise   Clinical Impression   Functional Level at Time of Session Pt presents with mild balance and endurance deficits. Pt reports not sleeping much the last couple days so feeling very tired this morning. Pt was able to complete functional mobility and basic ADLs with FWW and standby assist. Reported mild dizziness and increased lower abd pain with activity. Pt reports he will have assistance from his wife at d/c. Recommend home with assist and continued [place] when medically ready.   Criteria for Skilled Therapeutic Interventions Met (OT) yes   Rehab Potential good   Therapy Frequency minimum of 1x/week   Predicted Duration of Therapy until discharge   Anticipated Equipment Needs at Discharge none anticipated   Anticipated Discharge Disposition home with assist and home health   Evaluation Complexity Justification   Occupational Profile Review Brief history   Performance Deficits 3-5 deficits  Clinical Decision Making Moderate analytic complexity   Evaluation Complexity Moderate       Therapist:   Meyer Russel, OT   Pager #: [phone]

## 2021-06-22 NOTE — ED Nurses Note (Signed)
Returned to room.

## 2021-06-22 NOTE — Nurses Notes (Signed)
At approximately 1250, RN was called into room because pt was complaining of "having a hard time seeing". RN did an assessment and noticed slight right eye droop and pt complaining of feeling tingling all over. Pt was A&Ox4 and upon further assessment there were no visual field deficits. VSS. RN paged primary team and attending MD came to bedside. MD said slight eye droop was from pt's last admission. No further needs at this time. RN will continue to monitor.

## 2021-06-22 NOTE — ED Nurses Note (Signed)
Transported to xray via stretcher by [facility]

## 2021-06-22 NOTE — H&P (Signed)
[facility]   General Medicine  Admission H&P    Date of Service: 06/22/2021 Time: 00:28  Lance, [place], 76 y.o. male [phone])   Date of Admission:  06/21/2021  Date of Birth:  May 03, 1945  PCP: [facility]  Code: Full Code    Information Obtained from: patient  Chief Complaint:  Shortness of breath    HPI: Mr. Creppel is a 42 YOM with a history of HIV, HTN, HLD, DMII with peripheral neuropathy, hypothyroidism who presents to the the emergency department for further evaluation of acute hypoxic respiratory failure, found to have Ground glass opacities concerning for pneumonia, also has an incidental finding of acute T8 compression fracture found on CT scan. Admit for further management of pneumonia and compression fracture. He appears to be in no distress and is on 2 LPM NC 02 with no baseline O2 requirement.    He denies any fevers, chills, sweats, chest pain, abdominal pain, diarrhea, constipation or any urinary tract symptoms. He was reported to have shakiness, dizziness and confusion. He is able to answer questions appropriately for me. States he is very anxious and was recently started on hydroxyzine. Has a movement clinic visit with Dr. Valere Dross in August 2023    Patient's wife accompanies him states that he was on T[facility] at [facility] but is starting to eat now.     He wishes to be a full code    Advanced care planning [phone] (first 30 minutes)  -advance care planning including but not limited to explanation and discussion of advance directives such as standard forms (with completion of such forms, when performed), by physician or APP.      PAST MEDICAL:    Past Medical History:   Diagnosis Date   . Cancer (CMS [facility])     prostate   . Deep vein thrombosis (DVT) (CMS HCC)     right leg   . Esophageal reflux    . H/O hearing loss    . High cholesterol    . 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 HTN (hypertension)    . Human immunodeficiency  virus (HIV) disease (CMS HCC)    . Hyperlipidemia     "borderline"   . Hypothyroidism    . MRSA (methicillin resistant staph aureus) culture positive 01/02/2021    MRSA left groin abscess 01/03/21   . MRSA (methicillin resistant staph aureus) culture positive 01/02/2021    MRSA blood 01/02/21   . Pulmonary embolism (CMS HCC) 03/31/2021   . Thyroid disorder    . Wears glasses         Past Surgical History:   Procedure Laterality Date   . COLON SURGERY      per patient had part of colon removed and had a colostomy   . COLONOSCOPY     . GASTROSCOPY     . 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 Parke Simmers   . [place]  2001   . HX COLOSTOMY REVERSAL     . HX HERNIA REPAIR     . KNEE SURGERY Bilateral    . WRIST SURGERY Right             Prior to Admission Medications   Prescriptions Last Dose Informant Patient Reported? Taking?   BIOTIN ORAL  Patient Yes No   Sig: Take 1 Tablet  by mouth Once a day   Blood Sugar Diagnostic (ACCU-CHEK GUIDE TEST STRIPS) Strip   No No   Sig: 1 Strip Four times a day - before meals and bedtime   MULTIVITAMIN ORAL  Patient Yes No   Sig: Take 1 Tablet by mouth Once a day   POTASSIUM ORAL  Patient Yes No   Sig: Take 1 Tablet by mouth Once a day   acetaminophen (TYLENOL) 500 mg Oral Tablet   Yes No   Sig: Take 2 Tablets (1,000 mg total) by mouth Every 4 hours as needed for Pain   amLODIPine (NORVASC) 5 mg Oral Tablet   Yes No   Sig: Take 1 Tablet (5 mg total) by mouth Once a day   apixaban (ELIQUIS) 2.5 mg Oral Tablet   No No   Sig: Take 1 Tablet (2.5 mg total) by mouth Twice daily for 90 days   atorvastatin (LIPITOR) 80 mg Oral Tablet  Patient Yes No   Sig: Take 0.5 Tablets (40 mg total) by mouth Every morning with breakfast   cetirizine (ZYRTEC) 10 mg Oral Tablet  Patient Yes No   Sig: Take 1 Tablet (10 mg total) by mouth Once a day   cyanocobalamin (VITAMIN B 12) 1,000 mcg Oral Tablet   Yes No   Sig: Take 1  Tablet (1,000 mcg total) by mouth Every morning   elviteg-cob-emtri-tenof ALAFEN (GENVOYA) 150-150-200-10 mg Oral Tablet  Patient Yes No   Sig: Take 1 Tablet by mouth Once a day   flash glucose scanning reader (FREESTYLE LIBRE 2 READER) Does not apply Misc   No No   Sig: Use as directed with FreeStyle Libre 2 sensors   flash glucose sensor (FREESTYLE LIBRE 2 SENSOR) Does not apply Kit   No No   Sig: To check blood glucose continuously. Change every 14 days   flash glucose sensor (FREESTYLE LIBRE 2 SENSOR) Does not apply Kit   No No   Sig: To check blood glucose continuously. Change every 14 days   fluconazole (DIFLUCAN) 200 mg Oral Tablet   No No   Sig: Take 2 Tablets (400 mg total) by mouth Once a day for 13 days   gabapentin (NEURONTIN) 300 mg Oral Capsule   Yes No   Sig: Take 1 Capsule (300 mg total) by mouth Three times a day   insulin NPH isoph U-100 human 100 unit/mL Subcutaneous Suspension   No No   Sig: Inject 15 Units under the skin Every morning before breakfast   insulin NPH isoph U-100 human 100 unit/mL Subcutaneous Suspension   No No   Sig: Inject 25 Units under the skin Every evening after dinner   insulin lispro 100 units/mL Subcutaneous Injectable   No No   Sig: Inject 0-12 Units under the skin Four times a day as needed for Other   insulin regular human (HUMULIN R) 100 unit/mL Injection Solution   No No   Sig: Inject 12 Units under the skin Three times a day   lancets (FREESTYLE LANCETS) 28 gauge Misc   No No   Sig: Use to test blood sugar 4 times a day - before meals and bedtime   levothyroxine (SYNTHROID) 88 mcg Oral Tablet  Patient Yes No   Sig: Take 1 Tablet (88 mcg total) by mouth Every morning   melatonin 5 mg Oral Tablet   Yes No   Sig: Take 1 Tablet (5 mg total) by mouth Every night   meropenem 1 g in  NS 100 mL infusion   No No   Sig: Infuse 1 g into a venous catheter Every 8 hours for 14 days Mix and infuse per policy of [facility].   pantoprazole (PROTONIX) 40 mg Oral Tablet,  Delayed Release (E.C.)  Patient Yes No   Sig: Take 1 Tablet (40 mg total) by mouth Twice daily 30 minutes before a meal   prochlorperazine (COMPAZINE) 10 mg/2 mL (5 mg/mL) Injection Solution   No No   Sig: Infuse 2 mL (10 mg total) into a venous catheter Every 6 hours as needed   promethazine (PHENERGAN) 12.5 mg Oral Tablet   No No   Sig: Take 0.5 Tablets (6.25 mg total) by mouth Every 6 hours as needed for Nausea/Vomiting (refractory to other medications)   sennosides-docusate sodium (SENOKOT-S) 8.6-50 mg Oral Tablet   No No   Sig: Take 1 Tablet by mouth Twice daily   vancomycin 1,250 mg in NS 12.5 mL infusion   No No   Sig: 1,250 mg Every 24 hours Mix and infuse per policy of [facility].      Facility-Administered Medications: None     No Known Allergies    Family History  Family Medical History:     Problem Relation (Age of Onset)    Cancer Mother, Father, Other    Heart Attack Mother    High Cholesterol Other    Stroke Other            Social History  Social History     Socioeconomic History   . Marital status: Married     Spouse name: Not on file   . Number of children: Not on file   . Years of education: Not on file   . Highest education level: Not on file   Occupational History   . Not on file   Tobacco Use   . Smoking status: Never   . Smokeless tobacco: Never   Vaping Use   . Vaping Use: Never used   Substance and Sexual Activity   . Alcohol use: Not Currently   . Drug use: Never   . Sexual activity: Not on file       Review of Systems:  SOB, shakiness, dizziness and confusion  Objective    Diet and I/O's  MNT PROTOCOL FOR DIETITIAN  DIET DIABETIC Calorie amount: CC 1800  MNT PROTOCOL FOR DIETITIAN      Examination:  Temperature: 36.2 C (97.2 F) Heart Rate: (!) 102 BP (Non-Invasive): 121/65   Respiratory Rate: 16 SpO2: 91 %       Physical Exam:  General: Pleasant gentleman in no distress   Eyes: Conjunctivae/corneas clear, PERRLA, EOM's intact. Fundi benign., Sclera non-icteric.    HENT: Head  atraumatic and normocephalic, ENT without erythema or injection, mucous membranes moist.   Neck: Neck supple, no JVD or thyromegaly or lymphadenopathy   Lungs: Clear to auscultation bilaterally.  No crackles, rales or wheezing   Cardiovascular: regular rate and rhythm, S1, S2 normal, no murmur, click, rub or gallop   Abdomen: Soft, non-tender, Bowel sounds normal, non-distended, No hepatosplenomegaly   Extremities: No cyanosis or edema; pulses 2/2 in UE/LE, Strength 5/5 in UE/LE   Skin: Skin warm and dry, No rashes and No lesions   Neurologic: CN II - XII grossly intact , Alert and oriented x3, reflexes 2/2 in UE/LE   Lymphatics: No lymphadenopathy   Psychiatric: No suicidal or homicidal ideations     Labs:  CBC:  Recent Labs     06/21/21  1709 06/21/21  2155   WBC 14.5* 13.3*   HGB 7.8* 7.7*   HCT 25.7* 24.6*   PLTCNT 503* 511*     Differential:   Recent Labs     06/21/21  1709 06/21/21  2155   PMNS 49 39   LYMPHOCYTES 39 54   MONOCYTES 6 6   EOSINOPHIL 0 1   BASOPHILS 0  <0.10 0  <0.10   PMNABS 7.11 5.19   MONOSABS 0.87 0.80   EOSABS <0.10 0.13     BMP:  Recent Labs     06/21/21  1709 06/21/21  2155   SODIUM 147* 142   POTASSIUM 3.6 3.1*   CHLORIDE 109 108   CO2 26 26   BUN 19 19   CREATININE 0.82 0.89   GLUCOSENF 112 106   ANIONGAP 12 8   GFR >90 89     Recent Labs     06/21/21  1709 06/21/21  2155   CALCIUM 9.5 9.7   MAGNESIUM 1.9  --    PHOSPHORUS 3.7  --      RBC Indices:  Recent Labs     06/21/21  1709 06/21/21  2155   MCV 83.7 82.0   MCH 25.4* 25.7*   MCHC 30.4* 31.3   MPV 12.7* 12.3     Coagulation Studies:  Recent Labs     06/21/21  2155   INR 1.37*   PROTHROMTME 16.0*   APTT 36.1     Liver/Pancreas:  Recent Labs     06/21/21  1709   TOTALPROTEIN 6.3   ALBUMIN 3.2*   AST 24   ALT 13   ALKPHOS 131*   TOTBILIRUBIN 0.4     ABG's  No results found for this encounter  Cardiac Markers  Recent Labs     06/21/21  2155   TROPONINI 8       Imaging Studies:    Results for orders placed or performed during the  hospital encounter of 06/21/21 (from the past 72 hour(s))   CT CHEST ABDOMEN PELVIS W IV CONTRAST     Status: None    Narrative    CT CHEST ABDOMEN PELVIS W IV CONTRAST performed on 06/21/2021 10:50 PM    INDICATION: 76 years old Male  hx pancreatic neoplasm, HIV    TECHNIQUE: Axial images from the thoracic inlet through the pelvis obtained after administration of IV contrast with reformatted coronal and sagittal images, utilizing soft tissue and lung algorithms    CONTRAST: 100 cc of Isovue 370    RADIATION DOSE: 621.08 mGy.cm    COMPARISON: CT abdomen and pelvis dated 06/11/2021. CT chest, abdomen, and pelvis dated 05/24/2021.    FINDINGS:    CHEST:    Lungs/Pleura: The central airways are patent. There is bilateral dependent atelectasis and atelectasis within the right middle lobe and lingula. There is no pleural effusion or pneumothorax.     Heart/Mediastinum: The heart is slightly enlarged, though unchanged. There is no pericardial effusion. There is pectus excavatum. The Haller index measures 2.7. The esophagus is decompressed. No suspicious mediastinal lymph nodes are seen.    Soft Tissues/Bones: There are no suspicious axillary lymph nodes. There is a left PICC with the tip terminating within the mid SVC. There is a compression deformity of T8. There is irregularity of the T7 superior endplate which is unchanged from prior examinations including the CT on 03/24/2020. This may be degenerative  in nature.    ABDOMEN:    Liver: The liver is normal in size. There are subcentimeter low attenuating foci within the left and right hepatic lobes which are stable in appearance.    Gallbladder/Biliary System: The gallbladder is physiologically distended. The common bile duct is prominent in appearance, though unchanged.    Spleen: The spleen is surgically absent.    Pancreas: The patient is status post distal pancreatectomy. There are atrophic changes of the remaining pancreas. There is unchanged cystic area within the  pancreatic head. There are inflammatory changes surrounding the pancreas, predominantly within the pancreatic surgical bed. The amount of fat stranding is similar to the prior CT. Small fluid collection within the surgical bed is unchanged (series 9, image 34).    Adrenals: There is no adrenal nodularity.    Kidney/Ureters/Bladder: The kidneys are normal in size and demonstrate symmetrical enhancement. Numerous low attenuating foci within the parenchyma are unchanged from the prior CT. There is no hydronephrosis or hydroureter. The bladder is distended with no asymmetric wall thickening. There is a stone noted within the mid right ureter (series 14, image 29).     Reproductive Organs: The seminal vesicles are symmetric. Prostatic seeds are present.    Stomach/Bowel: The distal esophagus is decompressed. The stomach is decompressed. Stable cystogastrostomy tube and stents in place. The collection along the greater curvature of the stomach has decreased in size from the prior CT. There is no abnormal small or large bowel dilatation.      Vasculature: The aorta is normal caliber. There are scattered calcific atherosclerotic disease. An IVC filter is present. There is artifact near the portal confluence, limiting evaluation. Outside of this area no portal portal venous or SMV thrombus is seen.     Peritoneal Cavity/Lymph Nodes: There is no intraperitoneal fluid. There is inflammatory changes within the upper abdomen as described above. No suspicious lymph nodes.    Soft Tissues/Bones: There are no soft tissue fluid collections. The lumbar vertebral body heights are maintained. There are mild degenerative changes of the lumbar spine most prominent at L4-5.        Impression    1.Moderate compression deformity of T8, acute.   2.Right mid ureteral stone without hydronephrosis or hydroureter.  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 The fluid collection along the greater curvature of the  stomach is slightly decreased in size. Otherwise postoperative changes are unchanged from 06/11/2021.  4.Resolved left pleural fluid.  5.A few scattered areas of groundglass opacities within the bilateral upper lobes, nonspecific. Differential include atelectasis or infectious/inflammatory process.   CT ANGIO INTRACRANIAL W/WO IV CONTRAST     Status: None (Preliminary result)    Narrative    Roselind Rily Needs  Male, 76 years old.    CT ANGIO INTRACRANIAL W/WO IV CONTRAST performed on 06/21/2021 10:55 PM.    REASON FOR EXAM:  dysarthria, dizziness, BLE weakness  RADIATION DOSE: 1056.64 mGy.cm  CONTRAST: 50 ml's of Isovue 370    TECHNIQUE: Standard noncontrast head CT was performed with multiplanar reformatted imaging. CT angiography was then performed of the intracranial arterial vasculature. Multiplanar plus/minus subtracted CT angiographic reformatted imaging was performed.    Bolus Quality :Adequate    COMPARISON: CT angiogram dated 05/25/2021.    FINDINGS:     NON-CONTRAST HEAD CT    There is no mass effect, midline shift, abnormal fluid collection, or intracranial hemorrhage. There is no cerebellar ectopia or pituitary mass. The gray-white matter differentiation is preserved. The skull,  scalp, and orbits show no acute process. The mastoid air cells are well aerated. There is partial opacification of the paranasal sinuses.      INTRACRANIAL CTA    RIGHT Anterior Circulation    The distal cervical internal carotid artery is widely patent. The petrous internal carotid artery is widely patent. The cavernous internal carotid artery is patent. No aneurysms are appreciated. There are calcifications involving the proximal supraclinoid internal carotid artery. The origin of the ophthalmic artery is patent. No abnormalities involving the posterior communicating artery origin are appreciated. There is a tiny posterior communicating artery on this side, but is otherwise unremarkable in appearance.    The right middle  cerebral artery is patent. No evident bifurcation aneurysms are appreciated. The proximal M2 segments are patent. The origin of the anterior cerebral artery is patent. The A1 segment is widely patent. No abnormalities of the anterior communicating artery complex are demonstrated. The more distal portions of the anterior cerebral artery are without definitive abnormalities. The anterior communicating artery is patent.    LEFT Anterior Circulation    The distal cervical internal carotid artery is widely patent. The petrous internal carotid artery is widely patent. There are scattered calcifications throughout the cavernous internal carotid artery.  There are calcifications involving the proximal supraclinoid internal carotid artery. The origin of the ophthalmic artery is patent. No abnormalities involving the posterior communicating artery origin are appreciated. There is a tiny posterior communicating artery on this side, but is otherwise unremarkable in appearance.     The left middle cerebral artery is patent. No evident bifurcation aneurysms are appreciated. The proximal M2 segments are patent. The origin of the anterior cerebral artery is patent. The A1 segment is widely patent. No abnormalities of the anterior communicating artery complex are demonstrated. The more distal portions of the anterior cerebral artery are without definitive abnormalities. The anterior communicating artery is patent.    VERTEBROBASILAR System    The intradural vertebral arteries are codominant in size. The intradural vertebral arteries demonstrate are patent without significant stenoses. The basilar artery is patent. The posterior cerebral arteries are patent and are normal in configuration..    OTHER VASCULAR FINDINGS:  The dural venous sinuses are patent with no filling defects to suggest thrombosis.       Impression    1.Mild atherosclerotic changes. No severe stenosis, occlusion, or aneurysm.  2.No acute intracranial process.        CT ANGIO CAROTID-EXTRACRANIAL (NECK) W IV CONTRAST     Status: None (Preliminary result)    Narrative    Roselind Rily Koehn  Male, 76 years old.    CT ANGIO CAROTID-EXTRACRANIAL (NECK) W IV CONTRAST performed on 06/21/2021 11:01 PM.    REASON FOR EXAM:  dysarthria, dizziness, BLE weakness  RADIATION DOSE: 186.25 mGy.cm  CONTRAST: 50 ml of Isovue 370    TECHNIQUE: Standard CT angiographic acquisition was performed from the aortic arch through the skull base. Multiplanar reformatted reconstructions were performed.    Bolus Quality :Adequate    COMPARISON: CT angiogram extracranial 05/25/2021    FINDINGS:   Aortic Arch / Doristine Devoid Vessel Origins: These images demonstrate a normal-appearing three-vessel aortic arch without evident atherosclerotic changes involving the aortic arch great vessel origins.    VERTEBROBASILAR SYSTEM    Vertebral Artery Dominance:The vertebral arteries are codominant in size within the neck.    Left Vertebral Artery:  The left vertebral artery origin arises from the left subclavian artery and is widely patent. Left vertebral artery distal  to the origin demonstrates normal course and caliber throughout the neck and it's intradural portion.    Right Vertebral Artery:  The right vertebral artery arises from the subclavian artery and is without evident stenoses at its origin..The nonostial right vertebral artery is of normal course and caliber. No high-grade stenoses are appreciated.    RIGHT CAROTID SYSTEM    RIGHT Common Carotid Artery: The origin of the right common carotid artery is patent. There is normal course and caliber of the common carotid artery to the level of the bifurcation.    RIGHT Carotid Bifurcation/ICA:There is mild stenosis at the origin of the right cervical internal carotid artery. The degree of stenosis is estimated at less than 30% on a diameter basis.There is a combination of both hard and soft plaque involving the carotid bifurcation. No deep ulcerations are  appreciated.The right cervical internal carotid artery is patent. No evident stenoses are appreciated.    LEFT CAROTID SYSTEM    LEFT Common Carotid Artery:The origin of the left common carotid artery is patent. There is normal course and caliber of the left common carotid artery to the level of the bifurcation.    LEFT Carotid Bifurcation/ICA: There is mild stenosis at the origin of the left cervical internal carotid artery. The degree of stenosis is estimated at less than 30% on a diameter basis.There is a combination of both hard and soft plaque involving the carotid bifurcation. No ulcerations are appreciated.The left cervical internal carotid artery is patent. No evident stenoses are appreciated.    Non-Vascular Findings: Included portions of the lung apices are clear. The thyroid is homogenous. There are moderate to advanced degenerative changes at C5-C6 and C6-C7.          Impression    Widely patent extracranial arterial vasculature.         XR AP MOBILE CHEST     Status: None    Narrative    Maanav RUSSELL Bartoszek  Male, 76 years old.    XR AP MOBILE CHEST performed on 06/21/2021 11:08 PM.    REASON FOR EXAM:  Chest Pain    TECHNIQUE: 1 views/1 images submitted for interpretation.    COMPARISON:  Same day CT chest, abdomen, and pelvis.    FINDINGS:  The heart is at the upper limits of normal for size. There is bibasilar atelectasis. There is no pneumothorax or pleural effusion. There is a left PICC with the tip terminating within the lower SVC. There is a compression deformity of T8.      Impression    1.Bibasilar atelectasis.  2.Compression deformity of T8.       EKG:  Recent Results (from the past 720 hour(s))   ECG 12-LEAD    Collection Time: 06/21/21  9:39 PM   Result Value    Ventricular rate 100    Atrial Rate 100    PR Interval 138    QRS Duration 74    QT Interval 368    QTC Calculation 474    Calculated P Axis 37    Calculated R Axis 36    Calculated T Axis 42    Narrative    Normal sinus  rhythm  Nonspecific T wave abnormality  Abnormal ECG  When compared with ECG of 19-May-2021 15:06,  Nonspecific T wave abnormality now evident in Anterolateral leads       DNR Status:  Full Code    Assessment/Plan:   Mr. Moffatt is a 38 YOM with a history of HIV,  HTN, HLD, DMII with peripheral neuropathy, hypothyroidism who presents to the the emergency department for further evaluation of acute hypoxic respiratory failure, found to have Ground glass opacities concerning for pneumonia, also has an incidental finding of acute T8 compression fracture found on CT scan. Admit for further management of pneumonia and compression fracture. He appears to be in no distress and is on 2 LPM NC 02 with no baseline O2 requirement.    Active Hospital Problems    Diagnosis   . Primary Problem: Pneumonia     Acute hypoxic respiratory failure secondary to HCAP, cannot rule out gram negative pneumoniaHx of HIV  - 2 LPM NC O2. No baseline O2. Wean as tolerated  - IV Vancomycin and Cefepime ordered  - consult ID in the morning  - Patient's wife states he is on Genovya and she will bring in the medication in the morning  - will need patient's own medication ordered with specifics because this is nonformulary    Acute T8 compression fracture  - Orthopaedics consultation await recommendations  - PT.OT evaluation    HTN/HLD  - controlled  - Amlodipine 5 mg oral daily  - continue Lipitor 40 mg oral daily    DMII with peripheral neuropathy  - Continue Lantus 25 units nightly  - SSI Protocol  - Continue home neurontin  - Diabetic diet  - was on TPN at [facility]  - Nutrition consult and MNT protocol ordered    History of stroke and Pulmonary embolism  - continue Eliquis 2.5 mg oral BID    Mild protein calorie malnutrition on TPN  - Albumin 3.2  - Nutrition consult and MNT protocol  - Continue vitamin B12    Hypothyroidism  -continue Synthroid    Insomnia  - continue melatonin    Anxiety  - Vistaril PRN    DVT/PE Prophylaxis:  Apixaban    Howell Rucks, MD 06/22/2021 00:28  Hospitalist  Department of Internal Medicine  [facility]    On 06/22/2021 I spent a total visit time of 76 minutes. Time included review of tests and ordering tests, obtaining/reviewing history, examining the patient, communicating with consultants, documenting clinical information and counseling the patient and/or family regarding the diagnosis and management plan and coordination of care involved services directly related to patient care.    INITIAL H&P LEVEL 3 (TOTAL TIME > 75 MINUTES) ([id number])

## 2021-06-22 NOTE — Care Plan (Signed)
Medical Nutrition Therapy Assessment        SUBJECTIVE : Pt seen for TPN recommendations. Pt has lost 7# x 3 months. Pt was present with wife and she was able to provide majority of pts information. Pts wife says that pt was at [facility] up until last Thursday and while staying there pt would only consuming Jell-o and a spoon or two of pudding with pills. Since last Thursday pt has been consuming broth, crackers, chicken noodle soup, juice, jell-o, and yogurt. Pts wife says that pt has been tolerating oral intake, and for that reason she has been weaning pt off of TPN. Pts wife was unable to recall the rate at which pt was receiving the TPN, but she did mention that he would get it for 14 hours over night. 2/2 pts wife mentioning "weaning" pt off of his TPN, it is recommended that TPN start at half the goal rate to monitor his advancements in a controlled setting. Pt and wife denied pt having n/v/d/c. RD to continue to monitor and make recommendations prn.      OBJECTIVE:     Current Diet Order/Nutrition Support:  MNT PROTOCOL FOR DIETITIAN  DIET DIABETIC Calorie amount: CC 1800  MNT PROTOCOL FOR DIETITIAN  ROOM SERVICE:  NEEDS VISIT FOR MENU CHOICES     Height Used for Calculations: 177.8 cm ('5\' 10"'$ )  Weight Used For Calculations: 72.6 kg (160 lb 0.9 oz)  BMI (kg/m2): 23.01  BMI Assessment: BMI 18.5-24.9: normal  Ideal Body Weight (IBW) (kg): 76.48  % Ideal Body Weight: 94.93    Estimated Needs:    Energy Calorie Requirements: 2190-2555 kcal/day (30-35 kcal/kg)   Protein Requirements (gms/day): 90-110 g/day (1.2-1.5 g/kg)   Fluid Requirements: 2190-2555 mL/day (1 mL/kcal)     Comments: Mr. Hyndman is a 56 YOM with a history of HIV, HTN, HLD, DMII with peripheral neuropathy, hypothyroidism who presents to the the emergency department for further evaluation of acute hypoxic respiratory failure, found to have Ground glass opacities concerning for pneumonia, also has an incidental finding of acute T8 compression  fracture found on CT scan. Admit for further management of pneumonia and compression fracture.     Latest Reference Range & Units Most Recent   TRIGLYCERIDES <150 mg/dL 183 (H)  06/21/21 17:09   (H): Data is abnormally high    Plan/Interventions :   -Continue diabetic diet and encourage po intake   -Ordered room service for visits for menu choices    -Recommend oral multivitamin     -Initial TPN rate to run 8 pm - 10 am (12 hour run time w/ a 1 hour ramp up and 1 hour ramp down)  1200 cal /day = 16.5 cal/kg  60 g protein = .82 g/ kg  600 dextrose cals = 1.69 GIR  360 IL cals or .55 g fat/kg          -Goal TPN  2400 cal /day = 33 cal/kg  120 g protein = 1.65 g/ kg  1200 dextrose cals = 3.38 GIR  720 IL cals or 1.1 g fat/kg    -Monitor potassium, magnesium and phosphorus daily along with blood sugars and BMP and will advance TPN to goal as able (slowly by 200-400 calories at a time if signs of refeeding)         -Monitor LFTs and triglycerides weekly.   -Monitor weekly weights.   -Will continue to follow.     Nutrition Diagnosis: Altered GI function related to Current medical  condition as evidenced by Need for TPN    Rolena Infante, RDLD  06/22/2021, 12:58  Pager # [phone]

## 2021-06-22 NOTE — Consults (Addendum)
[facility]                                                       Metabolic Bone Disease Consult    Date of Service:  06/22/2021  Service: Hospitalist 41  Requesting MD: Beverlee Nims, MD  Chief Complaint:  Shortness of breath    Reason for Consult:  Evaluation for osteoporosis  Assessment/Recommendations:   Mr. Rubenstein is a 72 YOM with a history of HIV, HTN, HLD, DMII with peripheral neuropathy, hypothyroidism who presented to the ED from a rehab facility with worsening shortness of breath. Upon arrival he had a CXR and CT chest that revealed a T8 fracture. We were consulted for evaluation for osteoporosis. He is high risk for osteoporosis because of his HIV medications. There is also a possibility of metastatic bone disease given his recent hx of IPMN.     Recommend Bone Scan as this could be a pathologic fracture from bone metastases of pancreatic cancer.  1.  If vitamin D level low then discharge home on 50,000 units of vit D per week for 8 weeks.  If not low then dc home on 1000 units of vit D per day.   Vitamin D, PTH, calcium and cr individually reviewed and analyzed.   2.  Follow up with Dr. Chrissie Noa as outpatient.  3.  Dr. Chrissie Noa will send note to communicate with PCP and Dr. Chrissie Noa  personally contacted PCP for old recs including any DEXA scans.  4.  Dr. Chrissie Noa will help schedule DEXA as outpatient, interpret it, and determine metabolic bone disease treatment.  5.  Dr. Chrissie Noa will start on daily vit D supplementation as outpatient.  6.  Discussed with the patient as well as nursing staff fall precautions.   7.  Discussed case about the patients risks for metabolic bone disease with consulting physician.        HPI/Discussion:   Mr. Dullea is a 20 YOM with a history of HIV, HTN, HLD, DMII with peripheral neuropathy, hypothyroidism who presented to the ED from a rehab facility with worsening shortness of breath. Upon arrival he had  a CXR and CT chest that revealed a T8 fracture. Per patient he has never had a Dexa scan and has never been treated for osteoporosis. He is not currently on Ca or Vit D supplementation. He is on Genvoya for HIV which increases his risk of Osteoporosis and is on Protonix. When asked about his past cancer hx he reported that he has a hx of prostate cancer treated with focused radiation. 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 He has never been treated with long term steroids. He denies a smoking or alcohol hx. He reports that his only fracture was an ankle fracture a year and half ago after a fall while golfing. He reports that he had another fall in January while golfing. He reports that he has no known family hx of fractures but is estranged from is father.     Osteoporosis PMH Risk Factors:  Diabetes, Falls and prostate and pancreatic cancer  Osteoporosis Medication Risk Factors:  Proton Pump inhibitors and Genvoya  Osteoporosis Social History Risk Factors:  Smoker- non-smoker  ETOH-  Never  Osteoporosis Family History Risk Factors:  No known family hx of fractures    For other HPI/PMH/PSH/Medications/Allergies/Social HX/Family Hx/ROS please refer to the admitting provider's note:  Dr Ronalee Red, which I reviewed, date 06/22/21.  Exceptions/Additions/Clarifications: none    ROS:   Review of Systems:    General:  No height loss.  No recurrent fever. weight loss.  No weight gain.  Allergy:  No known drug allergies.    ENT:  No trouble swallowing.  No dental problems.    CV:  No chest pain.    Resp:  shortness of breath.  No hemoptysis.  GI:  No nausea.  No vomiting.  No heartburn. No persistent diarrhea.      MS:  No history of Rheumatoid arthritis.  Skin:   No recurrent skin infections  Endocrine:  Diabetes and hypothyroidism  Neuro:  No recurrent falls.  No seizure  history.  Mental Status:  No dementia  Heme:  history of blood clots. NO radiation hx for cancer  GU:  No recurrent kidney stones:        Exam:  Temperature: 36.5 C (97.7 F)  Heart Rate: (!) 101  BP (Non-Invasive): 133/77  Respiratory Rate: 19  SpO2: 90 %  PHYSICAL EXAMINATION:         GEN: NO acute distress.     MENTAL:  Alert and oriented times three.    OROPHARYNX: Oropharynx is clear, no lesions seen.      HEART:  Regular rate and rhythm without murmur, rub or gallop.  No increased P2.  LUNGS:   Decreased breath sounds bilaterally   SKIN: petechiae  skin rash noted today.  No malar rash, digital tip ulcers or psoriasis, no nail pits noted.    NEURO:  Follows all commands     MUSCULOSKELETAL EXAMINATION:  No synovitis of the small joints of hands, wrists, elbows, knees or ankles.        Labs:   Lab Results   Component Value Date    ALKPHOS 118 (H) 06/22/2021    CREATININE 0.89 06/21/2021    INTACTPTH 22.3 06/22/2021    TSH 1.246 06/22/2021    CALCIUM 9.6 06/22/2021    ALBUMIN 3.0 (L) 06/22/2021         Imaging Studies:      Imaging reviewed and showed a T8 fracture    Cammie Sickle, MD  Internal Medicine, PGY-3  [place] [facility]        I saw and examined the patient.  I reviewed the resident's note.  I agree with the findings and plan of care as documented in the resident's note.  Any exceptions/additions are edited/noted.    Vitamin D pending  My review of his thoracic xrays show osteopenia    1  Hx of prostate cancer and radiation  2.  Hx of pancreatic cancer  3  On Genvoya   4  Reflux on Protonix  5  T8 fx  6 . DM    1  Consider talking to ortho spine to see if there is any further imaging to make sure this isn't a met due to the low trauma nature of this fx (no other imaging will help per ortho.  Will follow with [place])  2  Follow vit D level  3  Will get DEXA as outpt  4.  Fall precautions    Loreli Slot, MD    Loreli Slot, MD      D  is normal at 42  He can resume his home vitamins upon  DC  Loreli Slot, MD

## 2021-06-22 NOTE — Care Plan (Signed)
Problem: Adult Inpatient Plan of Care  Goal: Plan of Care Review  Outcome: Ongoing (see interventions/notes)  Goal: Patient-Specific Goal (Individualized)  Outcome: Ongoing (see interventions/notes)  Goal: Absence of Hospital-Acquired Illness or Injury  Outcome: Ongoing (see interventions/notes)  Goal: Optimal Comfort and Wellbeing  Outcome: Ongoing (see interventions/notes)  Goal: Rounds/Family Conference  Outcome: Ongoing (see interventions/notes)     Problem: Fall Injury Risk  Goal: Absence of Fall and Fall-Related Injury  Outcome: Ongoing (see interventions/notes)

## 2021-06-22 NOTE — Nurses Notes (Signed)
Patient came in the floor alert ambulatory sitter select for safety oxygen hook call bell within reach will monitor

## 2021-06-22 NOTE — ED Nurses Note (Signed)
Attempted report-- unit RN unavailable, will call back

## 2021-06-22 NOTE — Progress Notes (Addendum)
Brief progress note    This is a 76 Yo man here for new onset anxiety, could not sleep for days, upper extremities tremulous. He was found to be hypoxemic on presentation and required 2L NC. No evidence of pneumonia on CXR and lung exam is benign. He has not had fever/chills lately. His wife accompanied him today and stated he was just anxious and tremulous over the past 24-48 hours with lack of sleep. No fever reporetd. He also has history of HIV and never missed Genvoya.     Given dizziness, he underwent CTA carotids and intracranial with no evidence for stroke.     He just finished a course of Vancomycin ( meropenem fluconazole ) from 4/20th till 5/11 and then extended with vancomycin, cefepime and flagyl till 5/18 for intra-abdominal abscess. CT scan still showing fluid collection near stomach though improved overall size.     He was started on vancomycin and cefepime given concerns for pneumonic process.     PLAN:  - no pneumonia  - can continue antimicrobials ( CRP slightly elevated ) will touch base with surgical oncology and GI teams to re-evaluate fluid collection. Would've preferably stopped antimicrobials as that will improve yield from intraabdominal fluid collection ( abscess) if any surgical plans are made, but he already received few doses and hence yield will not be accurate.  - resume Genvoya  - consulted with Psychiatry regarding sleep deprivation and anxiety  - no eminent need for neurology evaluation as no new focal deficits. Psychiatry stated that will see him in am and advice. (UE tremor noted on exam)  - has history of DVT, on adjusted dose apixaban given interaction with Genvoya  - continue with Lantus and SSi, wife stated that she has been giving only Lantus as he was on TPN  - was advised to resume Po intake recently by Surgical oncology   - On 4/20 he underwent cystogastrostomy with stent placement  - GI consulted  - resumed TPN  - psychiatry advised Remeron for sleep and will see in  am      Beverlee Nims, MD  Assistant Professor  [facility]

## 2021-06-23 ENCOUNTER — Ambulatory Visit (HOSPITAL_COMMUNITY): Payer: Self-pay

## 2021-06-23 ENCOUNTER — Ambulatory Visit (INDEPENDENT_AMBULATORY_CARE_PROVIDER_SITE_OTHER): Payer: 59 | Admitting: SURGICAL ONCOLOGY

## 2021-06-23 DIAGNOSIS — Z7901 Long term (current) use of anticoagulants: Secondary | ICD-10-CM

## 2021-06-23 DIAGNOSIS — I10 Essential (primary) hypertension: Secondary | ICD-10-CM

## 2021-06-23 DIAGNOSIS — G4700 Insomnia, unspecified: Secondary | ICD-10-CM

## 2021-06-23 DIAGNOSIS — E43 Unspecified severe protein-calorie malnutrition: Secondary | ICD-10-CM

## 2021-06-23 DIAGNOSIS — Z79899 Other long term (current) drug therapy: Secondary | ICD-10-CM

## 2021-06-23 DIAGNOSIS — Z8673 Personal history of transient ischemic attack (TIA), and cerebral infarction without residual deficits: Secondary | ICD-10-CM

## 2021-06-23 DIAGNOSIS — Z90411 Acquired partial absence of pancreas: Secondary | ICD-10-CM

## 2021-06-23 DIAGNOSIS — K8689 Other specified diseases of pancreas: Secondary | ICD-10-CM

## 2021-06-23 DIAGNOSIS — Z794 Long term (current) use of insulin: Secondary | ICD-10-CM

## 2021-06-23 DIAGNOSIS — Z86711 Personal history of pulmonary embolism: Secondary | ICD-10-CM

## 2021-06-23 DIAGNOSIS — E119 Type 2 diabetes mellitus without complications: Secondary | ICD-10-CM

## 2021-06-23 DIAGNOSIS — X58XXXA Exposure to other specified factors, initial encounter: Secondary | ICD-10-CM

## 2021-06-23 DIAGNOSIS — S31632A Puncture wound without foreign body of abdominal wall, epigastric region with penetration into peritoneal cavity, initial encounter: Secondary | ICD-10-CM

## 2021-06-23 DIAGNOSIS — E039 Hypothyroidism, unspecified: Secondary | ICD-10-CM

## 2021-06-23 DIAGNOSIS — E1142 Type 2 diabetes mellitus with diabetic polyneuropathy: Secondary | ICD-10-CM

## 2021-06-23 DIAGNOSIS — E785 Hyperlipidemia, unspecified: Secondary | ICD-10-CM

## 2021-06-23 DIAGNOSIS — R627 Adult failure to thrive: Secondary | ICD-10-CM

## 2021-06-23 DIAGNOSIS — K8591 Acute pancreatitis with uninfected necrosis, unspecified: Secondary | ICD-10-CM

## 2021-06-23 DIAGNOSIS — I2699 Other pulmonary embolism without acute cor pulmonale: Secondary | ICD-10-CM

## 2021-06-23 DIAGNOSIS — T8189XA Other complications of procedures, not elsewhere classified, initial encounter: Secondary | ICD-10-CM

## 2021-06-23 DIAGNOSIS — M4854XA Collapsed vertebra, not elsewhere classified, thoracic region, initial encounter for fracture: Secondary | ICD-10-CM

## 2021-06-23 DIAGNOSIS — A419 Sepsis, unspecified organism: Secondary | ICD-10-CM

## 2021-06-23 DIAGNOSIS — Z6823 Body mass index (BMI) 23.0-23.9, adult: Secondary | ICD-10-CM

## 2021-06-23 DIAGNOSIS — J189 Pneumonia, unspecified organism: Secondary | ICD-10-CM

## 2021-06-23 DIAGNOSIS — Z86718 Personal history of other venous thrombosis and embolism: Secondary | ICD-10-CM

## 2021-06-23 DIAGNOSIS — B20 Human immunodeficiency virus [HIV] disease: Secondary | ICD-10-CM

## 2021-06-23 DIAGNOSIS — F419 Anxiety disorder, unspecified: Secondary | ICD-10-CM

## 2021-06-23 DIAGNOSIS — Z9081 Acquired absence of spleen: Secondary | ICD-10-CM

## 2021-06-23 LAB — BASIC METABOLIC PANEL
ANION GAP: 6 mmol/L (ref 4–13)
BUN/CREA RATIO: 15 (ref 6–22)
BUN: 15 mg/dL (ref 8–25)
CALCIUM: 10.5 mg/dL — ABNORMAL HIGH (ref 8.8–10.2)
CHLORIDE: 111 mmol/L (ref 96–111)
CO2 TOTAL: 26 mmol/L (ref 23–31)
CREATININE: 0.99 mg/dL (ref 0.75–1.35)
ESTIMATED GFR: 79 mL/min/BSA (ref >=60)
GLUCOSE: 206 mg/dL — ABNORMAL HIGH (ref 65–125)
POTASSIUM: 3.4 mmol/L — ABNORMAL LOW (ref 3.5–5.1)
SODIUM: 143 mmol/L (ref 136–145)

## 2021-06-23 LAB — CBC WITH DIFF
HCT: 27.9 % — ABNORMAL LOW (ref 38.9–52.0)
HGB: 8.8 g/dL — ABNORMAL LOW (ref 13.4–17.5)
MCH: 26 pg (ref 26.0–32.0)
MCHC: 31.5 g/dL (ref 31.0–35.5)
MCV: 82.5 fL (ref 78.0–100.0)
MPV: 12.2 fL (ref 8.7–12.5)
PLATELETS: 501 10*3/uL — ABNORMAL HIGH (ref 150–400)
RBC: 3.38 10*6/uL — ABNORMAL LOW (ref 4.50–6.10)
RDW-CV: 19.1 % — ABNORMAL HIGH (ref 11.5–15.5)
WBC: 14.5 10*3/uL — ABNORMAL HIGH (ref 3.7–11.0)

## 2021-06-23 LAB — MANUAL DIFF AND MORPHOLOGY-SYSMEX
BASOPHIL #: <0.1 10*3/uL (ref <=0.20)
BASOPHIL %: 0 %
EOSINOPHIL #: 0.29 10*3/uL (ref <=0.50)
EOSINOPHIL %: 2 %
LYMPHOCYTE #: 7.11 10*3/uL — ABNORMAL HIGH (ref 1.00–4.80)
LYMPHOCYTE %: 49 %
MONOCYTE #: 1.31 10*3/uL — ABNORMAL HIGH (ref 0.20–1.10)
MONOCYTE %: 9 %
NEUTROPHIL #: 5.8 10*3/uL (ref 1.50–7.70)
NEUTROPHIL %: 40 %

## 2021-06-23 LAB — IRON TRANSFERRIN AND TIBC
IRON (TRANSFERRIN) SATURATION: 11 % — ABNORMAL LOW (ref 20–50)
IRON: 39 ug/dL — ABNORMAL LOW (ref 55–175)
TOTAL IRON BINDING CAPACITY: 344 ug/dL (ref 224–476)
TRANSFERRIN: 246 mg/dL (ref 160–340)

## 2021-06-23 LAB — HEPATIC FUNCTION PANEL
ALBUMIN: 3.2 g/dL — ABNORMAL LOW (ref 3.4–4.8)
ALKALINE PHOSPHATASE: 125 U/L — ABNORMAL HIGH (ref 45–115)
ALT (SGPT): 17 U/L (ref 10–55)
AST (SGOT): 29 U/L (ref 8–45)
BILIRUBIN DIRECT: 0.1 mg/dL (ref 0.1–0.4)
BILIRUBIN TOTAL: 0.3 mg/dL (ref 0.3–1.3)
PROTEIN TOTAL: 7.4 g/dL (ref 6.0–8.0)

## 2021-06-23 LAB — PT/INR
INR: 1.22 — ABNORMAL HIGH (ref 0.80–1.20)
PROTHROMBIN TIME: 14.2 s — ABNORMAL HIGH (ref 9.1–13.9)

## 2021-06-23 LAB — POC BLOOD GLUCOSE (RESULTS)
GLUCOSE, POC: 226 mg/dL — ABNORMAL HIGH (ref 70–105)
GLUCOSE, POC: 122 mg/dl — ABNORMAL HIGH (ref 70–105)
GLUCOSE, POC: 128 mg/dl — ABNORMAL HIGH (ref 70–105)
GLUCOSE, POC: 106 mg/dl — ABNORMAL HIGH (ref 70–105)

## 2021-06-23 LAB — MAGNESIUM: MAGNESIUM: 2 mg/dL (ref 1.8–2.6)

## 2021-06-23 LAB — PHOSPHORUS: PHOSPHORUS: 3.2 mg/dL (ref 2.3–4.0)

## 2021-06-23 LAB — FERRITIN: FERRITIN: 146 ng/mL (ref 20–300)

## 2021-06-23 MED ORDER — LORAZEPAM 2 MG/ML INJECTION WRAPPER
1.0000 mg | INTRAMUSCULAR | Status: AC
Start: 2021-06-23 — End: 2021-06-23
  Administered 2021-06-23: 1 mg via INTRAVENOUS
  Filled 2021-06-23: qty 1

## 2021-06-23 MED ORDER — MELATONIN 3 MG TABLET
9.0000 mg | ORAL_TABLET | Freq: Every evening | ORAL | Status: DC
Start: 2021-06-23 — End: 2021-06-24
  Administered 2021-06-23: 9 mg via ORAL
  Filled 2021-06-23: qty 3

## 2021-06-23 MED ORDER — WATER FOR INJECTION, STERILE INTRAVENOUS SOLUTION
INTRAVENOUS | Status: AC
Start: 2021-06-23 — End: 2021-06-24
  Filled 2021-06-23: qty 900

## 2021-06-23 MED ORDER — GABAPENTIN 300 MG CAPSULE
600.0000 mg | ORAL_CAPSULE | Freq: Every evening | ORAL | Status: DC
Start: 2021-06-23 — End: 2021-06-27
  Administered 2021-06-23 – 2021-06-26 (×4): 600 mg via ORAL
  Filled 2021-06-23 (×4): qty 2

## 2021-06-23 MED ORDER — GABAPENTIN 300 MG CAPSULE
300.0000 mg | ORAL_CAPSULE | Freq: Two times a day (BID) | ORAL | Status: DC
Start: 2021-06-23 — End: 2021-06-27
  Administered 2021-06-23 – 2021-06-27 (×9): 300 mg via ORAL
  Filled 2021-06-23 (×9): qty 1

## 2021-06-23 MED ORDER — TRAZODONE 50 MG TABLET
50.0000 mg | ORAL_TABLET | Freq: Once | ORAL | Status: AC
Start: 2021-06-23 — End: 2021-06-23
  Administered 2021-06-23: 50 mg via ORAL
  Filled 2021-06-23: qty 1

## 2021-06-23 NOTE — Care Plan (Signed)
[place]  Occupational Therapy Progress Note    Patient Name: [name]  Date of Birth: [birth year]  Height:  177.8 cm ('5\' 10"'$ )  Weight:  72.6 kg (160 lb 0.9 oz)  Room/Bed: 707/A  Payor: [employer] / Plan: [place] [employer] / Product Type: Managed Care /     Assessment:    Pt with improved pain control today and able to tolerate increased activity despite still feeling tired from poor sleep. Pt able to complete LB dressing with standby assist. Ambulated with FWW and standby. Wife present and supportive. Recommend home with assist and continued HH.      Discharge Needs:   Equipment Recommendation: none anticipated      Discharge Disposition:  home with assist, home with home health     JUSTIFICATION OF DISCHARGE RECOMMENDATION   Based on current diagnosis, functional performance prior to admission, and current functional performance, this patient requires continued OT services in home with assist, home with home health in order to achieve significant functional improvements.    Plan:   Continue to follow patient according to established plan of care.  The risks/benefits of therapy have been discussed with the patient/caregiver and he/she is in agreement with the established plan of care.     Subjective & Objective:      06/23/21 1118   Rehab Session   Document Type therapy progress note (daily note)   Total OT Minutes: 10   Patient Effort good   Symptoms Noted During/After Treatment fatigue;dizziness   General Information   Medical Lines PIV Line   Respiratory Status room air   Existing Precautions/Restrictions fall precautions;full code   Pre Treatment Status   Pre Treatment Patient Status Patient supine in bed;Call light within reach;Telephone within reach;Sitter select activated   Support Present Pre Treatment  Family present   Communication Pre Treatment  Nurse   Pain Assessment   Pre/Posttreatment Pain Comment denied pain   Coping/Psychosocial  Response Interventions   Plan Of Care Reviewed With patient;spouse   Cognitive Assessment/Interventions   Behavior/Mood Observations alert;cooperative   Orientation Status oriented x 4   Attention WNL/WFL   Follows Commands WNL   Mobility Assessment/Training   Mobility Comment pt ambulated 175 ft with FWW and SB A   Bed Mobility Assessment/Treatment   Bed Mobility, Assistive Device Head of Bed Elevated   Supine-Sit [place] stand-by assistance   Transfer Assessment/Treatment   Sit-Stand [place] stand-by assistance   Stand-Sit [place] stand-by assistance   Sit-Stand-Sit, Assist Device walker, front wheeled   Lower Body Dressing Assessment/Training   Position sitting;standing   DRESSING ASSESSED Don Underwear;Don Pants- pull up;Don Pants- fasten   [place] Level  standby assist   Information systems manager   Comment with FWW   Sitting Balance: Static good balance   Sitting, Dynamic (Balance) fair + balance   Sit-to-Stand Balance fair balance   Standing Balance: Static fair + balance   Standing Balance: Dynamic fair balance   Post Treatment Status   Post Treatment Patient Status Patient sitting in bedside chair or w/c;Telephone within reach;Call light within reach;Sitter select activated   Support Present Post Treatment  Family present   Financial trader Nurse   Clinical Impression   Functional Level at Time of Session Pt with improved pain control today and able to tolerate increased activity despite still feeling tired from poor sleep. Pt able to complete LB dressing with standby assist. Ambulated with FWW and standby. Wife present  and supportive. Recommend home with assist and continued HH.         Therapist:   Meyer Russel, OT  Pager #: [phone]

## 2021-06-23 NOTE — Consults (Signed)
[facility]  [facility] Advanced Wound Care   Initial Consult    [name], [name], 76 y.o. male  MRN: [id number]  Date of Birth:  [birth year]  Date of service: 06/23/2021  Encounter Start Date: 06/21/2021  Inpatient Admission Date:  06/22/2021  Hospital Day:  LOS: 1 day     Information Obtained from: patient, health care provider and history reviewed via medical record  Chief Complaint: open wound of abdomen    PCP: [place] By: Tommy Rainwater, RN  06/23/21 0550  IP CONSULT TO ADVANCED WOUND CARE TEAM  ONE TIME     Complete     Process Instructions: Please call extension [phone] from Monday-Friday 8:00am - 4:00pm with questions/concerns.     References:    ON CALL ([place])   Provider: (Not yet assigned)   Question Answer Comment   Reason for consult: OPEN WOUND/CHRONIC WOUND    Open Wound/Chronic Wound Location(s): abdomen    Evaluate and: WRITE ORDERS AND GIVE RECOMMENDATIONS    Is the patient being discharged today? No            Spoke with Dr. Ubaldo Glassing, hospitalist 9, OK to "write orders and give recommendations"     HPI:    [name] is a 76 y.o., White male with a past medical history of HTN, HIV, HLD, DMII, peripheral neuropathy, hypothyroidism who was admitted to [facility] on 06/21/21 for shortness of breath. Advanced wound care was consulted on day 2 of admission for open wound of abdomen. 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  He states after the surgery, the wound dehisced at the proximal site due infection from the staples. He has been packing the wound at home with dry packing guaze and covering. 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 He has been cleaning the distal wound and then covering with dry border  guaze at home. Drainage has been serosanguinous and moderate from both wounds. Patient states pain in the wound is 0/10. Current regimen is border dressing by bedside nursing. Patient does not follow with a wound center for the wound. Patient denies fever or chills, redness/streaking of the wound, malodorous drainage or increased pain.    PAST MEDICAL/ FAMILY/ SOCIAL HISTORY:       Past Medical History:   Diagnosis Date   . Cancer (CMS [facility])     prostate   . Deep vein thrombosis (DVT) (CMS HCC)     right leg   . Esophageal reflux    . H/O hearing loss    . High cholesterol    . 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 HTN (hypertension)    . 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 Hyperlipidemia     "borderline"   . Hypothyroidism    . MRSA (methicillin resistant staph aureus) culture positive 01/02/2021    MRSA left groin abscess 01/03/21   . MRSA (methicillin resistant staph aureus) culture positive 01/02/2021    MRSA blood 01/02/21   . Pulmonary embolism (CMS HCC) 03/31/2021   . Thyroid disorder    . Wears glasses          No Known Allergies  Medications  Prior to Admission     Prescriptions    acetaminophen (TYLENOL) 325 mg Oral Tablet    Take 2 Tablets (650 mg total) by mouth Every 6 hours as needed for Pain    amLODIPine (NORVASC) 10 mg Oral Tablet    Take 1 Tablet (10 mg total) by mouth Once a day    apixaban (ELIQUIS) 2.5 mg Oral Tablet    Take 1 Tablet (2.5 mg total) by mouth Twice daily for 90 days    Patient taking differently:  Take 1 Tablet (2.5 mg total) by mouth Twice daily Reduced dose is due to drug interaction with Genvoya.    atorvastatin (LIPITOR) 80 mg Oral Tablet    Take 0.5 Tablets (40 mg total) by mouth Every morning with breakfast    Blood Sugar Diagnostic (ACCU-CHEK GUIDE TEST STRIPS) Strip    1 Strip Four times a day - before meals and bedtime    cyanocobalamin (VITAMIN B 12) 1,000 mcg Oral Tablet    Take 1 Tablet (1,000 mcg total) by mouth  Every morning    elviteg-cob-emtri-tenof ALAFEN (GENVOYA) 150-150-200-10 mg Oral Tablet    Take 1 Tablet by mouth Once a day    flash glucose scanning reader (FREESTYLE LIBRE 2 READER) Does not apply Misc    Use as directed with FreeStyle Libre 2 sensors    flash glucose sensor (FREESTYLE LIBRE 2 SENSOR) Does not apply Kit    To check blood glucose continuously. Change every 14 days    flash glucose sensor (FREESTYLE LIBRE 2 SENSOR) Does not apply Kit    To check blood glucose continuously. Change every 14 days    gabapentin (NEURONTIN) 100 mg Oral Capsule    Take 1 Capsule (100 mg total) by mouth Three times a day    hydrOXYzine pamoate (VISTARIL) 25 mg Oral Capsule    Take 1 Capsule (25 mg total) by mouth Four times a day as needed for Itching    insulin lispro 100 units/mL Subcutaneous Injectable    Inject 0-12 Units under the skin Four times a day as needed for Other    insulin NPH isoph U-100 human 100 unit/mL Subcutaneous Suspension    Inject 15 Units under the skin Every morning before breakfast    Patient taking differently:  Inject 15 Units under the skin Every morning before breakfast and 25 units before dinner    insulin regular human (HUMULIN R) 100 unit/mL Injection Solution    Inject 12 Units under the skin Three times a day    Patient taking differently:  Inject 5 Units under the skin Three times a day    lancets (FREESTYLE LANCETS) 28 gauge Misc    Use to test blood sugar 4 times a day - before meals and bedtime    levothyroxine (SYNTHROID) 88 mcg Oral Tablet    Take 1 Tablet (88 mcg total) by mouth Every morning    melatonin 3 mg Oral Tablet    Take 2 Tablets (6 mg total) by mouth Every night    Mirtazapine (REMERON) 7.5 mg Oral Tablet    Take 1 Tablet (7.5 mg total) by mouth Every night    MULTIVITAMIN ORAL    Take 1 Tablet by mouth Once a day    oxyCODONE (ROXICODONE) 5 mg Oral Tablet    Take 1 Tablet (5 mg total) by mouth Every 4 hours as needed for Pain    pantoprazole (PROTONIX) 40 mg Oral  Tablet, Delayed Release (E.C.)  Take 1 Tablet (40 mg total) by mouth Twice daily 30 minutes before a meal    polyethylene glycol (MIRALAX) 17 gram/dose Oral Powder    Take 3 teaspoons (17 g total) by mouth Once a day    prochlorperazine (COMPAZINE) 10 mg/2 mL (5 mg/mL) Injection Solution    Infuse 2 mL (10 mg total) into a venous catheter Every 6 hours as needed    scopolamine (TRANSDERM SCOP) 1 mg over 3 days Transdermal patch 3 day    Place 1 Patch on the skin Every 72 hours    sennosides-docusate sodium (SENOKOT-S) 8.6-50 mg Oral Tablet    Take 1 Tablet by mouth Twice daily         acetaminophen (TYLENOL) tablet, 975 mg, Oral, Q6H PRN  adult custom variable rate parenteral nutrition, , Intravenous, Continuous  amLODIPine (NORVASC) tablet, 5 mg, Oral, Daily  apixaban (ELIQUIS) tablet, 2.5 mg, Oral, 2x/day  atorvastatin (LIPITOR) tablet, 40 mg, Oral, Daily with Breakfast  cefepime (MAXIPIME) 2 g in NS 100 mL IVPB minibag, 2 g, Intravenous, Q12H  cetirizine (ZYRTEC) tablet, 10 mg, Oral, Daily  cyanocobalamin (VITAMIN B12) tablet, 1,000 mcg, Oral, QAM  D5W 250 mL flush bag, , Intravenous, Q15 Min PRN  elvitegravir-cobicistat-emtricitrabine-tenofovir (GENVOYA) 150-150-200-10 mg per tablet, 1 Tablet, Oral, Daily  gabapentin (NEURONTIN) capsule, 300 mg, Oral, 3x/day  hydrOXYzine pamoate (VISTARIL) capsule, 50 mg, Oral, 4x/day PRN  insulin glargine-yfgn 100 units/mL injection, 25 Units, Subcutaneous, NIGHTLY  levothyroxine (SYNTHROID) tablet, 88 mcg, Oral, QAM  melatonin tablet, 4.5 mg, Oral, NIGHTLY  mirtazapine (REMERON) tablet, 7.5 mg, Oral, NIGHTLY  NS 250 mL flush bag, , Intravenous, Q15 Min PRN  NS flush syringe, 2-6 mL, Intracatheter, Q8HRS  NS flush syringe, 2-6 mL, Intracatheter, Q1 MIN PRN  oxyCODONE (ROXICODONE) immediate release tablet, 5 mg, Oral, Q4H PRN  pantoprazole (PROTONIX) delayed release tablet, 40 mg, Oral, 2x/day  promethazine (PHENERGAN) tablet, 6.25 mg, Oral, Q6H PRN  sennosides-docusate sodium  (SENOKOT-S) 8.6-50mg per tablet, 1 Tablet, Oral, 2x/day  SSIP insulin lispro 100 units/mL injection, 0-12 Units, Subcutaneous, 4x/day PRN      Past Surgical History:   Procedure Laterality Date   . COLON SURGERY      per patient had part of colon removed and had a colostomy   . COLONOSCOPY     . GASTROSCOPY     . 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 Parke Simmers   . [place]  2001   . HX COLOSTOMY REVERSAL     . HX HERNIA REPAIR     . KNEE SURGERY Bilateral    . WRIST SURGERY Right          Family History:    Family Medical History:     Problem Relation (Age of Onset)    Cancer Mother, Father, Other    Heart Attack Mother    High Cholesterol Other    Stroke Other            Social History     Tobacco Use   . Smoking status: Never   . Smokeless tobacco: Never   Vaping Use   . Vaping Use: Never used   Substance Use Topics   . Alcohol use: Not Currently   . Drug use: Never       Social Determinants of Health     Financial Resource Strain: Not on file   Transportation Needs: Not on file  Social Connections: Not on file   Intimate Partner Violence: Not on file   Housing Stability: Not on file   Health Literacy: Not on file   Employment Status: Not on file         ROS:  MUST comment on all "Abnormal" findings   Constitutional: negative for fevers, chills, fatigue, and weight loss  Eyes: negative for visual disturbance  ENT: negative for hearing loss and sore throat  Respiratory: negative for cough, wheezing  Cardiovascular: negative for chest pressure/discomfort, no claudication, no lower extremity edema  Gastrointestinal: negative for dysphagia, nausea, vomiting, diarrhea, decrease in appetite   Integumentary: negative for rash, changes in skin color, dryness  Musculoskeletal: negative for myalgias, muscle weakness, joint pain  Neurological: negative for dizziness, no paresthesia/paraplegia  Behavioral/Psych: negative for anxiety,  depression  All remaining systems negative.    PHYSICAL EXAMINATION: MUST comment on all "Abnormal" findings    Exam Temperature: 36.6 C (97.9 F)  Heart Rate: (!) 101  BP (Non-Invasive): 116/65  Respiratory Rate: 16  SpO2: 93 %    Constitutional: chronically ill; appears in no acute distress  Eyes: PERRL, conjunctiva non-icteric   ENT: mucous membranes moist, trachea midline  Respiratory: non-labored on RA, no cyanosis  Cardiovascular: no edema, +2 bilateral dorsalis pedis pulses  Neurologic: A&Ox3; grossly intact  Musculoskeletal: Equal strength to bilateral upper/lower extremities, head normocephalic  Psychiatric: Normal affect; cooperative with exam, pleasant  Integumentary: skin warm and dry, open wound to abdomen, no signs/symptoms of infection, no hemosiderin staining noted, no erythema, no xerosis    Wound #1  Type: Surgical  Location: proximal midline abdomen  Length: 0.7 cm  Width: 0.2 cm  Depth: 0.4 cm  Undermining/Tunneling: Yes- Tunnel of 0.7 cm at 12 o' clock  Wound Base: granular  Wound Edges: rolled  Drainage Amount: Moderate  Serosanguineous drainage with odor Absent  Periwound: without periwound erythema, crepitus, necrosis or gangrene          Wound #2  Type: Surgical  Location: distal abdomen  Length: 2.5 cm  Width: 2 cm  Depth: 0.3 cm  Undermining/Tunneling: Yes- UM of 0.3 from 1-4 o'clock  Wound Base: granular  Wound Edges: rolled  Drainage Amount: Moderate  Serosanguineous drainage with odor Absent  Periwound: without periwound erythema, crepitus, necrosis or gangrene        Labs Ordered/ Reviewed (Please indicate ordered or reviewed)   Reviewed: Labs:  I have reviewed all lab results.  Lab Results Today:    Results for orders placed or performed during the hospital encounter of 06/21/21 (from the past 24 hour(s))   POC BLOOD GLUCOSE (RESULTS)   Result Value Ref Range    GLUCOSE, POC 98 70 - 105 mg/dl   POC BLOOD GLUCOSE (RESULTS)   Result Value Ref Range    GLUCOSE, POC 104 70 - 105 mg/dl    POC BLOOD GLUCOSE (RESULTS)   Result Value Ref Range    GLUCOSE, POC 114 (H) 70 - 105 mg/dl   BASIC METABOLIC PANEL   Result Value Ref Range    SODIUM 143 136 - 145 mmol/L    POTASSIUM 3.4 (L) 3.5 - 5.1 mmol/L    CHLORIDE 111 96 - 111 mmol/L    CO2 TOTAL 26 23 - 31 mmol/L    ANION GAP 6 4 - 13 mmol/L    CALCIUM 10.5 (H) 8.8 - 10.2 mg/dL    GLUCOSE 206 (H) 65 - 125 mg/dL    BUN 15 8 - 25  mg/dL    CREATININE 0.99 0.75 - 1.35 mg/dL    BUN/CREA RATIO 15 6 - 22    ESTIMATED GFR 79 >=60 mL/min/BSA   HEPATIC FUNCTION PANEL   Result Value Ref Range    ALBUMIN 3.2 (L) 3.4 - 4.8 g/dL     ALKALINE PHOSPHATASE 125 (H) 45 - 115 U/L    ALT (SGPT) 17 10 - 55 U/L    AST (SGOT)  29 8 - 45 U/L    BILIRUBIN TOTAL 0.3 0.3 - 1.3 mg/dL    BILIRUBIN DIRECT 0.1 0.1 - 0.4 mg/dL    PROTEIN TOTAL 7.4 6.0 - 8.0 g/dL   MAGNESIUM   Result Value Ref Range    MAGNESIUM 2.0 1.8 - 2.6 mg/dL   PHOSPHORUS   Result Value Ref Range    PHOSPHORUS 3.2 2.3 - 4.0 mg/dL   PT/INR   Result Value Ref Range    PROTHROMBIN TIME 14.2 (H) 9.1 - 13.9 seconds    INR 1.22 (H) 0.80 - 1.20   CBC WITH DIFF   Result Value Ref Range    WBC 14.5 (H) 3.7 - 11.0 x10^3/uL    RBC 3.38 (L) 4.50 - 6.10 x10^6/uL    HGB 8.8 (L) 13.4 - 17.5 g/dL    HCT 27.9 (L) 38.9 - 52.0 %    MCV 82.5 78.0 - 100.0 fL    MCH 26.0 26.0 - 32.0 pg    MCHC 31.5 31.0 - 35.5 g/dL    RDW-CV 19.1 (H) 11.5 - 15.5 %    PLATELETS 501 (H) 150 - 400 x10^3/uL    MPV 12.2 8.7 - 12.5 fL   MANUAL DIFF AND MORPHOLOGY-SYSMEX   Result Value Ref Range    NEUTROPHIL % 40 %    LYMPHOCYTE %  49 %    MONOCYTE % 9 %    EOSINOPHIL % 2 %    BASOPHIL % 0 %    NEUTROPHIL # 5.80 1.50 - 7.70 x10^3/uL    LYMPHOCYTE # 7.11 (H) 1.00 - 4.80 x10^3/uL    MONOCYTE # 1.31 (H) 0.20 - 1.10 x10^3/uL    EOSINOPHIL # 0.29 <=0.50 x10^3/uL    BASOPHIL # <0.10 <=0.20 x10^3/uL    ANISOCYTOSIS 2+/Moderate (A) None    SCHISTOCYTES 1+/Few None    SPHEROCYTES 2+/Moderate (A) None    SMUDGE CELLS Present (A) None   POC BLOOD GLUCOSE (RESULTS)    Result Value Ref Range    GLUCOSE, POC 226 (H) 70 - 105 mg/dl   POC BLOOD GLUCOSE (RESULTS)   Result Value Ref Range    GLUCOSE, POC 122 (H) 70 - 105 mg/dl       ALBUMIN (g/dL )   Date Value   06/23/2021 3.2 (L)   06/22/2021 3.0 (L)     PREALBUMIN (mg/dL)   Date Value   05/29/2021 16.5 (L)   03/15/2021 45.0     HEMOGLOBIN A1C (%)   Date Value   03/17/2021 9.3 (H)     Ordered: N/A    Radiology Tests Ordered/ Reviewed (Please indicate ordered or reviewed)   Reviewed: I have reviewed all data imaging to date.   CT CHEST ABDOMEN PELVIS W IV CONTRAST performed on 06/21/2021 10:50 PM    INDICATION: 76 years old Male  hx pancreatic neoplasm, HIV    TECHNIQUE: Axial images from the thoracic inlet through the pelvis obtained after administration of IV contrast with reformatted coronal and sagittal images, utilizing soft tissue and lung algorithms  CONTRAST: 100 cc of Isovue 370    RADIATION DOSE: 621.08 mGy.cm    COMPARISON: CT abdomen and pelvis dated 06/11/2021. CT chest, abdomen, and pelvis dated 05/24/2021.    FINDINGS:    CHEST:    Lungs/Pleura: The central airways are patent. There is bilateral dependent atelectasis and atelectasis within the right middle lobe and lingula. There is no pleural effusion or pneumothorax.     Heart/Mediastinum: The heart is slightly enlarged, though unchanged. There is no pericardial effusion. There is pectus excavatum. The Haller index measures 2.7. The esophagus is decompressed. No suspicious mediastinal lymph nodes are seen.    Soft Tissues/Bones: There are no suspicious axillary lymph nodes. There is a left PICC with the tip terminating within the mid SVC. There is a compression deformity of T8. There is irregularity of the T7 superior endplate which is unchanged from prior examinations including the CT on 03/24/2020. This may be degenerative in nature.    ABDOMEN:    Liver: The liver is normal in size. There are subcentimeter low attenuating foci within the left and right  hepatic lobes which are stable in appearance.    Gallbladder/Biliary System: The gallbladder is physiologically distended. The common bile duct is prominent in appearance, though unchanged.    Spleen: The spleen is surgically absent.    Pancreas: The patient is status post distal pancreatectomy. There are atrophic changes of the remaining pancreas. There is unchanged cystic area within the pancreatic head. There are inflammatory changes surrounding the pancreas, predominantly within the pancreatic surgical bed. The amount of fat stranding is similar to the prior CT. Small fluid collection within the surgical bed is unchanged (series 9, image 34).    Adrenals: There is no adrenal nodularity.    Kidney/Ureters/Bladder: The kidneys are normal in size and demonstrate symmetrical enhancement. Numerous low attenuating foci within the parenchyma are unchanged from the prior CT. There is no hydronephrosis or hydroureter. The bladder is distended with no asymmetric wall thickening. There is a stone noted within the mid right ureter (series 14, image 29).     Reproductive Organs: The seminal vesicles are symmetric. Prostatic seeds are present.    Stomach/Bowel: The distal esophagus is decompressed. The stomach is decompressed. Stable cystogastrostomy tube and stents in place. The collection along the greater curvature of the stomach has decreased in size from the prior CT. There is no abnormal small or large bowel dilatation.      Vasculature: The aorta is normal caliber. There are scattered calcific atherosclerotic disease. An IVC filter is present. There is artifact near the portal confluence, limiting evaluation. Outside of this area no portal portal venous or SMV thrombus is seen.     Peritoneal Cavity/Lymph Nodes: There is no intraperitoneal fluid. There is inflammatory changes within the upper abdomen as described above. No suspicious lymph nodes.    Soft Tissues/Bones: There are no soft tissue fluid  collections. The lumbar vertebral body heights are maintained. There are mild degenerative changes of the lumbar spine most prominent at L4-5.    IMPRESSION:  1.Moderate compression deformity of T8, acute.   2.Right mid ureteral stone without hydronephrosis or hydroureter.  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 The fluid collection along the greater curvature of the stomach is slightly decreased in size. Otherwise postoperative changes are unchanged from 06/11/2021.  4.Resolved left pleural fluid.  5.A few scattered areas of groundglass opacities within the bilateral upper lobes, nonspecific. Differential include atelectasis or infectious/inflammatory process.    Ordered:  N/A    Assessment/Plan   [name] is a 76 y.o., White male with a past medical history of HTN, HIV, HLD, DMII, peripheral neuropathy, hypothyroidism who was admitted to [facility] on 06/21/21 for shortness of breath. Advanced wound care was consulted on day 2 of admission for open wound of abdomen.    Non-Healing surgical wound, proximal midline abdomen  Clean wound with NS or wound cleanser and 4x4 Gauze. Then cut piece of 4x4 hydrofera blue (materials [phone]), sized to fit as packing into the wound, then moisten hydrofera blue with NS. Then using a Q-tip, lightly pack hydrofera blue into wound bed at 12 o' clock tunnel, leaving 1-2 cm of hydrofera sticking out of wound entry. Cover with 4x4 Duramax super-absorbant dressing (materials [phone]) changed daily    Non-Healing surgical wound, distal midline abdomen  Clean wound with wound cleanser and 4x4 gauze. Apply pieces of collagen- Biostep (materials [phone]) to wound bed followed by 4x4 Duramax super-absorbant dressing (materials [phone]), changed daily.    Pressure injury prevention:  -Meticulous hygiene to the involved area.  -Preventive measures-turn and position patient Q2 hours and prn.  -Frequent repositioning when up in  the chair.  -Waffle cushion for pressure redistribution when up in the chair.    Nutrition deficit, risk for:  -Albumin 3.2 on 06/23/21  -Wounds present to midline abdomen  - IP Nutrition Services consult ordered for protein malnutrition  -MNT Protocol    Type 2 diabetes mellitus:  -HA1C Level 9.3 on 03/17/21  -Tight glycemic control. Management per primary team.    Additional recommendations:  -PT/OT already following this patient.   -Case management for home health needs and discharge planning.  -Follow up should be scheduled upon discharge with the [facility] [phone]), made by primary team    -We will continue to follow patient throughout admission. Please call if there are any questions/concerns prior to follow up.    Astrid Drafts, FNP-C  06/23/2021, 11:29  [facility]  Ext: [phone]  Please Secure chat me on Tuesday/Thursday

## 2021-06-23 NOTE — Care Plan (Signed)
[place]  Physical Therapy Initial Evaluation    Patient Name: [name]  Date of Birth: March 08, 1945  Height: Height: 177.8 cm ('5\' 10"'$ )  Weight: Weight: 72.6 kg (160 lb 0.9 oz)  Room/Bed: 707/A  [place]or: [facility] [employer] [employer] / Plan: [place] [facility] / Product Type: Managed Care /     Assessment:      Pt tolerated functional mobility assessment well. Able to complete all transfers and ambulation with SBA and FWW. No LOB noted, able to complete more than household distances. Rec d/c to home with assist and [facility] PT once medically appropriate.    Discharge Needs:    Equipment Recommendation: none anticipated  Discharge Disposition: home with assist, home with home health    JUSTIFICATION OF DISCHARGE RECOMMENDATION   Based on current diagnosis, functional performance prior to admission, and current functional performance, this patient requires continued PT services in home with assist, home with home health in order to achieve significant functional improvements in these deficit areas: aerobic capacity/endurance, ergonomics and body mechanics, gait, locomotion, and balance, muscle performance.        Plan:   Current Intervention: balance training, bed mobility training, gait training, patient/family education, postural re-education, stair training, strengthening, stretching, transfer training  To provide physical therapy services minimum of 1x/week  for duration of until discharge.    The risks/benefits of therapy have been discussed with the patient/caregiver and he/she is in agreement with the established plan of care.       Subjective & Objective        06/23/21 1119   Therapist Pager   PT Assigned/ Pager # Landry Mellow [phone]   Rehab Session   Document Type evaluation   Total PT Minutes: 10   Patient Effort good   Symptoms Noted During/After Treatment dizziness;fatigue   General Information   Patient Profile Reviewed yes   Pertinent History of Current Functional Problem  16 YOM with a history of HIV, HTN, HLD, DMII with peripheral neuropathy, hypothyroidism who presents to the the emergency department for further evaluation of acute hypoxic respiratory failure, found to have Ground glass opacities concerning for pneumonia, also has an incidental finding of acute T8 compression fracture found on CT scan. Admit for further management of pneumonia and compression fracture.   Medical Lines PIV Line   Mutuality/Individual Preferences   Individualized Care Needs OOB SBA FWW   Patient-Specific Goals (Include Timeframe) to get well   Plan of Care Reviewed With patient   Living Environment   Lives With spouse   Living Arrangements house   Home Assessment: No Problems Identified   Living Environment Comment has basement stairs but does not use them   Functional Level Prior   Ambulation 1 - assistive equipment   Transferring 1 - assistive equipment   Toileting 0 - independent   Bathing 1 - assistive equipment   Dressing 0 - independent   Eating 0 - independent   Communication 0 - understands/communicates without difficulty   Prior Functional Level Comment was d/c'd from [facility] 10 days ago, using FWW and shower chair, wife able to assist prn   Self-Care   Equipment Currently Used at Home walker, standard;cane, straight   Pre Treatment Status   Pre Treatment Patient Status Patient supine in bed;Call light within reach;Telephone within reach;Nurse approved session;Sitter select activated   Support Present Pre Treatment  Family present   Communication Pre Treatment  Nurse   Cognitive Assessment/Interventions   Behavior/Mood Observations alert;cooperative  Orientation Status oriented x 4   Attention WNL/WFL   Follows Commands WNL   Vital Signs   O2 Delivery Pre Treatment room air   O2 Delivery Post Treatment room air   Pain Assessment   Pre/Posttreatment Pain Comment no c/o pain   RLE Assessment   RLE Assessment WFL for stated baseline   LLE Assessment   LLE Assessment WFL for stated baseline    Bed Mobility Assessment/Treatment   Bed Mobility, Assistive Device Head of Bed Elevated   Supine-Sit Independence stand-by assistance   Transfer Assessment/Treatment   Sit-Stand Independence stand-by assistance   Stand-Sit Independence stand-by assistance   Sit-Stand-Sit, Assist Device walker, front wheeled   Gait Assessment/Treatment   Total Distance Ambulated 150   Independence  stand-by assistance   Assistive Device  walker, front wheeled   Distance in Feet 150   Gait Speed moderate   Impairments  endurance   Balance Skill Training   Comment with FWW   Sitting Balance: Static good balance   Sitting, Dynamic (Balance) fair + balance   Sit-to-Stand Balance fair balance   Standing Balance: Static fair + balance   Standing Balance: Dynamic fair balance   Post Treatment Status   Post Treatment Patient Status Patient sitting in bedside chair or w/c;Call light within reach;Telephone within reach;Sitter select activated   Support Present Post Treatment  Family present   Environmental manager   Plan of Care Review   Plan Of Care Reviewed With patient;spouse   Basic Mobility Am-PAC/6Clicks Score (APPROVED Staff)   Turning in bed without bedrails 4   Lying on back to sitting on edge of flat bed 4   Moving to and from a bed to a chair 4   Standing up from chair 4   Walk in room 3   Climbing 3-5 steps with railing 3   6 Clicks Raw Score total 22   Standardized (t-scale) score 47.4   CMS 0-100% Score 25.02   CMS Modifier CJ   Patient Mobility Goal (JHHLM) 7- Walk 25 feet or more 3X/day   Exercise/Activity Level Performed 7- Walked 25 feet or more   Physical Therapy Clinical Impression   Assessment Pt tolerated functional mobility assessment well. Able to complete all transfers and ambulation with SBA and FWW. No LOB noted, able to complete more than household distances. Rec d/c to home with assist and [facility] PT once medically appropriate.   Patient/Family Goals Statement to get better   Criteria for Skilled  Therapeutic yes;meets criteria;skilled treatment is necessary   Pathology/Pathophysiology Noted musculoskeletal   Impairments Found (describe specific impairments) aerobic capacity/endurance;ergonomics and body mechanics;gait, locomotion, and balance;muscle performance   Functional Limitations in Following  self-care;home management;community/leisure   Disability: Inability to Perform community/leisure   Rehab Potential good   Therapy Frequency minimum of 1x/week   Predicted Duration of Therapy Intervention (days/wks) until discharge   Anticipated Equipment Needs at Discharge (PT) none anticipated   Anticipated Discharge Disposition home with assist;home with home health   Evaluation Complexity Justification   Patient History: Co-morbidity/factors that impact Plan of Care 1-2 that impact Plan of Care;One or more other medical co-morbidity   Examination Components 4 or more Exam elements addressed;Balance;Bed mobility;Transfers;Ambulation   Presentation Evolving: Symptoms, complaints, characteristics of condition changing &/or cognitive deficits present   Clinical Decision Making Moderate complexity   Evaluation Complexity Moderate complexity   Care Plan Goals   PT Rehab Goals Gait Training Goal;Transfer Training Goal   Gait Training  Goal,  Distance to Achieve   Gait Training  Goal, Date Established 06/23/21   Gait Training  Goal, Time to Achieve by discharge   Gait Training  Goal, Independence Level modified independence   Gait Training  Goal, Assist Device least restricted assistive device   Gait Training  Goal, Distance to Achieve 150   Transfer Training Goal   Transfer Training Goal, Date Established 06/23/21   Transfer Training Goal, Time to Achieve by discharge   Transfer Training Goal, Activity Type all transfers   Transfer Training Goal, Independence Level modified independence   Transfer Training Goal, Assist Device least restrictrictive assistive device   Planned Therapy Interventions, PT Eval   Planned  Therapy Interventions (PT) balance training;bed mobility training;gait training;patient/family education;postural re-education;stair training;strengthening;stretching;transfer training       Therapist:   Nils Flack, PT   Pager #: [phone]

## 2021-06-23 NOTE — Consults (Signed)
Gastroenterology and Hepatology   Consult Note      Name: [name], [name], 76 y.o. male  Date of Admission:  06/21/2021  Date of Service: 06/23/2021  Date of Birth:  Oct 14, 1945  Requesting MD: Ubaldo Glassing, DO    Assessment/Plan:  [name] is a 76 y.o. male with hx of HIV infection, hypertension, hyperlipidemia. 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 We are consulted to evaluate patient's walled off necrosis.     Problem List:  1. 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 Fluid collection is splenectomy site  3. Peripancreatic fluid collections s/p RUQ drain and empiric embolization  4. IPMN s/pdistal pancreatectomy and splenectomy2/21/2023  5. Hx of Pseudomonas bacteremia  6. 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 HIV infection on treatment    Recommendations:  - Will plan for EGD with necrosectomy tomorrow   - Please keep NPO after midnight for the planned procedure   - Monitor LFT's, CBC and BMP  - Keep INR < 1.5, platelets > 50,000 and Hgb > 7  - Please hold antiplatelets and anticoagulants if possible  - Please call / page GI fellow on call with questions / concerns       HPI: [name] is a 76 y.o. male with hx of HIV infection, hypertension, hyperlipidemia. 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 Patient after his necrosectomy was supposed to have CT abdomen and pelvis in 4 weeks and repeat EGD in 4-6 weeks later. His next outpatient EGD was scheduled to be June 6. Since patient is admitted, we have been asked if we would like tom evaluate patient for EGD while patient is inpatient. Patient and his wife denies fevers or chills at home. Patient's wife report patient shaking and speaking words that did not make sense.      PMH:  Past Medical History:   Diagnosis Date   . Cancer (CMS [facility])     prostate   . Deep vein thrombosis (DVT) (CMS HCC)     right leg   . Esophageal reflux    . H/O hearing loss    . High cholesterol    . 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 HTN (hypertension)    . 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 Hyperlipidemia     "borderline"   . Hypothyroidism    . MRSA (methicillin resistant staph aureus) culture positive 01/02/2021    MRSA left groin abscess 01/03/21   . MRSA (methicillin resistant staph aureus) culture positive 01/02/2021    MRSA blood 01/02/21   . Pulmonary embolism (CMS HCC) 03/31/2021   . Thyroid disorder    . Wears glasses            PSH:  Past Surgical History:   Procedure Laterality Date   . COLON SURGERY      per patient had part of colon removed and had  a colostomy   . COLONOSCOPY     . GASTROSCOPY     . 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 Parke Simmers   . [address]  2001   . HX COLOSTOMY REVERSAL     . HX HERNIA REPAIR     . KNEE SURGERY Bilateral    . WRIST SURGERY Right            FAMHX:  Family Medical History:     Problem Relation (Age of Onset)    Cancer Mother, Father, Other    Heart Attack Mother    High Cholesterol Other    Stroke Other            SOCHX:  Social History     Socioeconomic History   . Marital status: Married   Tobacco Use   . Smoking status: Never   . Smokeless tobacco: Never   Vaping  Use   . Vaping Use: Never used   Substance and Sexual Activity   . Alcohol use: Not Currently   . Drug use: Never   Other Topics Concern   . Ability to Walk 1 Flight of Steps without SOB/CP Yes   . Routine Exercise Yes   . Ability to Walk 2 Flight of Steps without SOB/CP Yes   . Unable to Ambulate No   . Total Care No   . Ability To Do Own ADL's Yes   . Uses Walker No   . Other Activity Level Yes   . Uses Cane No       Objective:    Review of Systems  Other than ROS in the HPI, all other systems were negative.    Physical Exam  Filed Vitals:    06/22/21 2359 06/23/21 0445 06/23/21 0807 06/23/21 1147   BP: 135/75 128/74 116/65 120/66   Pulse: (!) 101 (!) 106 (!) 101 97   Resp: '16 16 16 17   '$ Temp: 36.7 C (98.1 F) 36.5 C (97.7 F) 36.6 C (97.9 F) 36.6 C (97.9 F)   SpO2: 94% 95% 93% 91%     General: No apparent distress, laying in bed   Eyes:  Non-icteric   Mouth:  Mucous membranes moist  Neck:  Supple, trachea midline  Lungs:  CTAB, no wheezing  Heart:  RRR  Abdomen: Soft, non-tender to palpation, non-distended  Extremities: No cyanosis, no edema  Neuro:  Grossly normal, able to answer simple questions with meaningful answers.   Skin:  Warm and dry    Labs:  I have reviewed all pertinent lab values.    Latest Reference Range & Units 06/21/21 17:09 06/21/21 21:55 06/23/21 04:52   WBC 3.7 - 11.0 x10^3/uL 14.5 (H) 13.3 (H) 14.5 (H)   HGB 13.4 - 17.5 g/dL 7.8 (L) 7.7 (L) 8.8 (L)   HCT 38.9 - 52.0 % 25.7 (L) 24.6 (L) 27.9 (L)   PLATELET COUNT 150 - 400 x10^3/uL 503 (H) 511 (H) 501 (H)   RBC 4.50 - 6.10 x10^6/uL 3.07 (L) 3.00 (L) 3.38 (L)   MCV 78.0 - 100.0 fL 83.7 82.0 82.5   MCHC 31.0 - 35.5 g/dL 30.4 (L) 31.3 31.5   MCH 26.0 - 32.0 pg 25.4 (L) 25.7 (L) 26.0   RDW-CV 11.5 - 15.5 % 19.5 (H) 18.6 (H) 19.1 (H)   MPV 8.7 - 12.5 fL 12.7 (H) 12.3 12.2     Imaging:  I have reviewed all  pertinent imaging studies.       IMPRESSION:  1.Moderate compression deformity of T8, acute.   2.Right mid ureteral stone without  hydronephrosis or hydroureter.  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 The fluid collection along the greater curvature of the stomach is slightly decreased in size. Otherwise postoperative changes are unchanged from 06/11/2021.  4.Resolved left pleural fluid.  5.A few scattered areas of groundglass opacities within the bilateral upper lobes, nonspecific. Differential include atelectasis or infectious/inflammatory process.      Fanny Skates, MD  PGY-4 Gastroenterology and Hepatology Fellow

## 2021-06-23 NOTE — Transitional Care (Signed)
Initial Assessment  Name: [name]  Address: [address]  [place] [address]  Date of Birth: 10-Oct-1945 76 y.o.  Date of Service: 06/23/2021  Wendie Agreste, [name] at [phone]       1.  Obtained verbal consent from patient to contact and schedule a PCP follow up in 7-14 days per PCP request.   2.  PCP listed as [facility]. This is correct per patient.   Last visit: 3 months or so per patient  3.  Patient appointment preferences: anytime per patient  4.  Transportation to healthcare appointments: drives self, wife will take currently  5.  Patient MyChart status:  patient states they  actively use MyChart   6. Preferred method of contact:  phone per patient  7. Verbal confirmation per patient  that contact information below is correct:   Home Phone [phone]   Work Phone [phone]   Mobile [phone]     8.  Obtained verbal consent to speak with  lay caregiver:  wife  9.  Confirmed per patient,  address listed above is correct.   10.  Confirmed Pharmacy per  patient    Preferred Pharmacy     [id number] [place]   [address]   Phone: [phone]: [phone]   Hours: Not open 24 hours    [facility]    [address] [place]   Phone: [phone]: [phone]   Hours: Not open 24 hours    [facility]   [id number]   Phone: [phone]: [phone]   Hours: 24/7            Spoke with patient to discuss provider of primary care services.  Discussed role of Transitional Care Coordination Team and informed of contact within 2 business days of discharge to assess follow-up plan.    CLesly Dukes LPN

## 2021-06-23 NOTE — Consults (Signed)
[facility]   Dept of Behavioral Medicine & Psychiatry  Initial Psychiatry Consult Note    Patient Name: [name]  DOB: May 04, 1945  MRN: [id number]  Date of Service: 06/23/21     Reason for Consult: Anxiety, insomnia    HISTORY OF PRESENT ILLNESS:  Channing Yeager, 76 y.o., male admitted to Medicine service with concern for anxiety, insomnia and infection. On antibiotics. Psychiatry team was consulted for evaluation of anxiety and not able to sleep.     Patient was very restless in bed this morning. He reports that for about a week he not able to sleep well at night. He lie in bed for small period of time and than he gets an internal urge to walk. He use his walker, walk back and forth to the bed and than get some relief. But has to repeat it multiple times at night. The urge to move, mostly starts from his legs.     Describes that the little sleep he do get is full of bizarre dreams, like he is in a war and trying to fight for his life. He used to work in the [employer], but was never in such a situation. He denies any suicidal or homicidal ideations, do have an empty revolver by his bedside all the time.     Appears to have a flat effect, restricted gaze and rigidity on exam.     Denies any palpitations, doom or gloom sensation, SOB, family history of such issues, any past trauma, past psychiatric diagnosis.     Depressive Symptoms:  Endorses decreased sleep    Anxiety Symptoms:   Endorses restlessness, feeling keyed up, or on edge     Manic Symptoms:  Endorses NO symptoms consistent with mania or hypomania     Psychotic Symptoms:  Endorses NO symptoms consistent with psychosis    PTSD Symptoms:  Endorses NO symptoms consistent with PTSD      Psychiatric History:    Current Psychiatric Medications: Remeron   Historical Psychiatric Diagnoses: None  Outpatient: Does not currently follow with a psychiatrist  Inpatient: inpatient a total of 6 times in the past, most recently in March 2023  Past  Medication Trials: None  Suicide Attempts/self harm history: Denies past history of suicide attempts    Substance Use History:    Nicotine: Denies  Cannabis: Denies   Alcohol: Denies  Benzodiazepines: Denies  Opiates: Denies  Stimulants: Denies  Other: Denies    Social History:    Living: Currently lives with wife  Family: Main support system is Wife  Education: Highest level of education is Middle school  Occupation: currently unemployed  Nature conservation officer experience: Midwife history  Legal: no current legal issues, per patient report  Religion/Spirituality: Religious  Interests: Reports no interests at this time.     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    HTN (hypertension)     Human immunodeficiency virus (HIV) disease (CMS HCC)     Hyperlipidemia     "borderline"  Hypothyroidism     MRSA (methicillin resistant staph aureus) culture positive 01/02/2021    MRSA left groin abscess 01/03/21    MRSA (methicillin resistant staph aureus) culture positive 01/02/2021    MRSA blood 01/02/21    Pulmonary embolism (CMS HCC) 03/31/2021    Thyroid disorder     Wears glasses          Past Surgical History:   Procedure Laterality Date    COLON SURGERY      per patient had 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 Darryl Nestle CERVICAL SPINE SURGERY  2001    HX COLOSTOMY REVERSAL      HX HERNIA REPAIR      KNEE SURGERY Bilateral     WRIST SURGERY Right            Family Psychiatric History:    Suicide Attempts: No  Substance Use Disorders: No  Psychiatric Diagnoses: No      ROS: 12  point ROS completed.. Pertinent positives listed below.  All other ROS Negative      OBJECTIVE:    Mental Status Exam:  Appearance:  casually dressed.  Level of Consciousness:  alert  Eye Contact:  fair.    Motor:  no psychomotor retardation or agitation.    Speech:  normal rate and volume.    Behavior:  cooperative.    Orientation: oriented x 3  Attention:  good.    Memory:  grossly normal  Knowledge: Good  Thought Process:  linear.    Thought Content:  no paranoia or delusions.  Perception:  no hallucinations endorsed  Insight:  good.    Judgement:  good.    Suicidal Ideation:  none.    Homicidal Ideaton:  None expressed   Mood:  anxious.    Affect:  appears anxious.     Physical Exam:   Constitutional: BP 116/65   Pulse (!) 101   Temp 36.6 C (97.9 F)   Resp 16   Ht 1.778 m ('5\' 10"'$ )   Wt 72.6 kg (160 lb 0.9 oz)   SpO2 93%   BMI 22.97 kg/m       Head: Atraumatic, normocephalic   Eyes: Pupils equal, round. EOM grossly intact. No nystagmus. Conjunctiva clear.  Respiratory: No increased work of breathing. No use of accessory muscles.  Cardiovascular: No swelling/edema of exposed extremities.  Musculoskeletal: No gross deformity   Neuro: No abnormal movements noted. No tremor.  Psych: As above in MSE  Skin: Dry. No diaphoresis or flushing. No noticeable erythema, abrasions, or lesions on exposed skin.    Current Inpatient Medications:    Current Facility-Administered Medications:     acetaminophen (TYLENOL) tablet, 975 mg, Oral, Q6H PRN, Beverlee Nims, MD    adult custom variable rate parenteral nutrition, , Intravenous, Continuous, Beverlee Nims, MD, Last Rate: 50 mL/hr at 06/23/21 0731, Rate Change at 06/23/21 0731    amLODIPine (NORVASC) tablet, 5 mg, Oral, Daily, Celene Skeen, MD, 5 mg at 06/23/21 0900    apixaban (ELIQUIS) tablet, 2.5 mg, Oral, 2x/day, Celene Skeen, MD, 2.5 mg at 06/23/21 0901    atorvastatin (LIPITOR) tablet, 40 mg, Oral, Daily with Breakfast, Celene Skeen, MD, 40 mg at 06/23/21 0901     cefepime (MAXIPIME) 2 g in NS 100 mL IVPB  minibag, 2 g, Intravenous, Q12H, Gwen Her, MD, Stopped at 06/23/21 0219    cetirizine (ZYRTEC) tablet, 10 mg, Oral, Daily, Celene Skeen, MD, 10 mg at 06/23/21 0901    cyanocobalamin (VITAMIN B12) tablet, 1,000 mcg, Oral, QAM, Celene Skeen, MD, 1,000 mcg at 06/23/21 0555    D5W 250 mL flush bag, , Intravenous, Q15 Min PRN, Celene Skeen, MD    elvitegravir-cobicistat-emtricitrabine-tenofovir (GENVOYA) 150-150-200-10 mg per tablet, 1 Tablet, Oral, Daily, Beverlee Nims, MD, 1 Tablet at 06/23/21 9024    gabapentin (NEURONTIN) capsule, 300 mg, Oral, 3x/day, Celene Skeen, MD, 300 mg at 06/23/21 0901    hydrOXYzine pamoate (VISTARIL) capsule, 50 mg, Oral, 4x/day PRN, Celene Skeen, MD, 50 mg at 06/22/21 2028    insulin glargine-yfgn 100 units/mL injection, 25 Units, Subcutaneous, NIGHTLY, Celene Skeen, MD, 25 Units at 06/22/21 2101    levothyroxine (SYNTHROID) tablet, 88 mcg, Oral, Othelia Pulling, MD, 88 mcg at 06/23/21 0555    melatonin tablet, 4.5 mg, Oral, NIGHTLY, Celene Skeen, MD, 4.5 mg at 06/22/21 2029    mirtazapine (REMERON) tablet, 7.5 mg, Oral, NIGHTLY, Beverlee Nims, MD, 7.5 mg at 06/22/21 2029    NS 250 mL flush bag, , Intravenous, Q15 Min PRN, Celene Skeen, MD    NS flush syringe, 2-6 mL, Intracatheter, Q8HRS, Celene Skeen, MD, 6 mL at 06/23/21 0600    NS flush syringe, 2-6 mL, Intracatheter, Q1 MIN PRN, Celene Skeen, MD    oxyCODONE (ROXICODONE) immediate release tablet, 5 mg, Oral, Q4H PRN, Dorthula Nettles, APRN,NP-C, 5 mg at 06/23/21 0973    pantoprazole (PROTONIX) delayed release tablet, 40 mg, Oral, 2x/day, Celene Skeen, MD, 40 mg at 06/23/21 0901    promethazine (PHENERGAN) tablet, 6.25 mg, Oral, Q6H PRN, Celene Skeen, MD    sennosides-docusate sodium (SENOKOT-S) 8.6-'50mg'$  per tablet, 1 Tablet, Oral, 2x/day, Celene Skeen, MD, 1 Tablet at 06/23/21 0901    SSIP insulin lispro 100 units/mL injection, 0-12 Units, Subcutaneous, 4x/day PRN, 4 Units at 06/23/21 0700 **AND**  POCT WHOLE BLOOD GLUCOSE, , , TID AC & HS, Celene Skeen, MD    Pertinent Studies/Testing:  Results for orders placed or performed during the hospital encounter of 06/21/21 (from the past 24 hour(s))   BASIC METABOLIC PANEL   Result Value Ref Range    SODIUM 143 136 - 145 mmol/L    POTASSIUM 3.4 (L) 3.5 - 5.1 mmol/L    CHLORIDE 111 96 - 111 mmol/L    CO2 TOTAL 26 23 - 31 mmol/L    ANION GAP 6 4 - 13 mmol/L    CALCIUM 10.5 (H) 8.8 - 10.2 mg/dL    GLUCOSE 206 (H) 65 - 125 mg/dL    BUN 15 8 - 25 mg/dL    CREATININE 0.99 0.75 - 1.35 mg/dL    BUN/CREA RATIO 15 6 - 22    ESTIMATED GFR 79 >=60 mL/min/BSA   CBC/DIFF    Narrative    The following orders were created for panel order CBC/DIFF.  Procedure                               Abnormality         Status                     ---------                               -----------         ------  CBC WITH DIFF[[id number]]                Abnormal            Final result               MANUAL DIFF AND MORPHOLO.Marland KitchenMarland Kitchen[[id number]]  Abnormal            Final result                 Please view results for these tests on the individual orders.   HEPATIC FUNCTION PANEL   Result Value Ref Range    ALBUMIN 3.2 (L) 3.4 - 4.8 g/dL     ALKALINE PHOSPHATASE 125 (H) 45 - 115 U/L    ALT (SGPT) 17 10 - 55 U/L    AST (SGOT)  29 8 - 45 U/L    BILIRUBIN TOTAL 0.3 0.3 - 1.3 mg/dL    BILIRUBIN DIRECT 0.1 0.1 - 0.4 mg/dL    PROTEIN TOTAL 7.4 6.0 - 8.0 g/dL   MAGNESIUM   Result Value Ref Range    MAGNESIUM 2.0 1.8 - 2.6 mg/dL   PHOSPHORUS   Result Value Ref Range    PHOSPHORUS 3.2 2.3 - 4.0 mg/dL   PT/INR   Result Value Ref Range    PROTHROMBIN TIME 14.2 (H) 9.1 - 13.9 seconds    INR 1.22 (H) 0.80 - 1.20    Narrative    Coumadin therapy INR range for Conventional Anticoagulation is 2.0 to 3.0 and for Intensive Anticoagulation 2.5 to 3.5.   CBC WITH DIFF   Result Value Ref Range    WBC 14.5 (H) 3.7 - 11.0 x10^3/uL    RBC 3.38 (L) 4.50 - 6.10 x10^6/uL    HGB 8.8 (L) 13.4 - 17.5 g/dL    HCT  27.9 (L) 38.9 - 52.0 %    MCV 82.5 78.0 - 100.0 fL    MCH 26.0 26.0 - 32.0 pg    MCHC 31.5 31.0 - 35.5 g/dL    RDW-CV 19.1 (H) 11.5 - 15.5 %    PLATELETS 501 (H) 150 - 400 x10^3/uL    MPV 12.2 8.7 - 12.5 fL   MANUAL DIFF AND MORPHOLOGY-SYSMEX   Result Value Ref Range    NEUTROPHIL % 40 %    LYMPHOCYTE %  49 %    MONOCYTE % 9 %    EOSINOPHIL % 2 %    BASOPHIL % 0 %    NEUTROPHIL # 5.80 1.50 - 7.70 x10^3/uL    LYMPHOCYTE # 7.11 (H) 1.00 - 4.80 x10^3/uL    MONOCYTE # 1.31 (H) 0.20 - 1.10 x10^3/uL    EOSINOPHIL # 0.29 <=0.50 x10^3/uL    BASOPHIL # <0.10 <=0.20 x10^3/uL    ANISOCYTOSIS 2+/Moderate (A) None    SCHISTOCYTES 1+/Few None    SPHEROCYTES 2+/Moderate (A) None    SMUDGE CELLS Present (A) None   POC BLOOD GLUCOSE (RESULTS)   Result Value Ref Range    GLUCOSE, POC 98 70 - 105 mg/dl   POC BLOOD GLUCOSE (RESULTS)   Result Value Ref Range    GLUCOSE, POC 104 70 - 105 mg/dl   POC BLOOD GLUCOSE (RESULTS)   Result Value Ref Range    GLUCOSE, POC 114 (H) 70 - 105 mg/dl   POC BLOOD GLUCOSE (RESULTS)   Result Value Ref Range    GLUCOSE, POC 226 (H) 70 - 105 mg/dl        Thyroid:   Lab Results  Component Value Date    TSH 1.246 06/22/2021    FREET4 0.68 (L) 05/25/2021       EKG:   Recent Results (from the past 720 hour(s))   ECG 12-LEAD    Collection Time: 06/21/21  9:39 PM   Result Value    Ventricular rate 100    Atrial Rate 100    PR Interval 138    QRS Duration 74    QT Interval 368    QTC Calculation 474    Calculated P Axis 37    Calculated R Axis 36    Calculated T Axis 42    Narrative    Normal sinus rhythm  Nonspecific T wave abnormality  Abnormal ECG  When compared with ECG of 19-May-2021 15:06,  Nonspecific T wave abnormality now evident in Anterolateral leads  Confirmed by Blenda Mounts, Rosanna ([id number]) on 06/22/2021 2:08:56 AM       QTC: 474    Ethanol:   No results found for: ETHANOLSERUM    Urine Drug Screen:   No results found for: Matt Holmes, [name], [place], [place], [place],  [place]        ASSESSMENT:    Bernd Crom is a 76 y.o. male with a history of abdominal abscess, anxiety, insomnia who originally presented to Medicine service with concern for sepsis and worsening anxiety. Psychiatry was originally consulted for further evaluation of anxiety and sleep issues.    Patient reports vivid dreams, an urge to move at night that is keeping him from falling sleep. Noticed to have rigidity, a flat facial expression, restricted gaze and bradykinesia on exam.       Psychiatric Diagnoses: Anxiety  Pertinent Medical Diagnoses: Sepsis (PNA vs abdominal abscess)     Safety Screening:  Risk Factors Include: anxiety, irritability and physical symptom burden. Patient does have guns in the home.  Protective Factors Include: no thoughts of suicide, no thoughts of self-harm, no thoughts of harm toward others, no prior suicide attempts, no prior self-injury and supportive family.  Summary: At this time, the patient does not pose an acute risk above baseline. Protective factors include: a supportive family, no past history of suicidal thoughts or attempts.       PLAN:    - Please place psychiatry consult order if not already done.  - Precautions: Consider Bedside sitter (defer to primary) for other purpose (agitation, fall risk, etc)  - Continue Remeron 7.'5mg'$  daily.   - Please increase Melatonin to '10mg'$  nightly.   - Please check iron and ferritin levels.   - Please change gabapentin dose to '300mg'$  QA + '300mg'$  noon+'600mg'$  QPM  - Will recommend a Neurology evaluation.       Creola Corn, MD    I was present and participated in the development of the treatment plan. I saw and examined the patient on DOS.  I reviewed the resident's note.  I agree with the findings and plan of care as documented in the resident's note.  Any exceptions/additions are edited/noted.    Sheila Oats, MD 06/23/2021

## 2021-06-23 NOTE — Pharmacy (Addendum)
Pharmacy Medication Reconciliation    Patient Name: [name], [name]  Date of Service: 06/22/2021  Date of Admission: 06/21/2021  Date of Birth: 1945-12-27  Length of Stay:   0 days   Service: HOSPITALIST 9      Transitions of Care:  Discharge Pharmacy services were discussed with the patient and the Meds to [facility] flowsheet and preferred pharmacy information were updated in EMR if applicable and able to assess with patient.    Information was collected from:  [place] Prior to Admission Medications:  Prior to Admission medications    Medication Sig Taking Resumed Y/N (RPh) Comments   acetaminophen (TYLENOL) 325 mg Oral Tablet Take 2 Tablets (650 mg total) by mouth Every 6 hours as needed for Pain Yes  Yes     amLODIPine (NORVASC) 10 mg Oral Tablet Take 1 Tablet (10 mg total) by mouth Once a day Yes  Yes - ordered 5 mg daily inpatient     apixaban (ELIQUIS) 2.5 mg Oral Tablet Take 1 Tablet (2.5 mg total) by mouth Twice daily for 90 days Yes  Yes  Reduced dose d/t interaction with genvoya   atorvastatin (LIPITOR) 80 mg Oral Tablet Take 0.5 Tablets (40 mg total) by mouth Every morning with breakfast Yes  Yes     BIOTIN ORAL Take 1 Tablet by mouth Once a day   No  not on MAR from [facility]   cetirizine (ZYRTEC) 10 mg Oral Tablet Take 1 Tablet (10 mg total) by mouth Once a day   Yes  not on MAR from [facility]   cyanocobalamin (VITAMIN B 12) 1,000 mcg Oral Tablet Take 1 Tablet (1,000 mcg total) by mouth Every morning Yes  Yes     elviteg-cob-emtri-tenof ALAFEN (GENVOYA) 150-150-200-10 mg Oral Tablet Take 1 Tablet by mouth Once a day Yes  Yes     gabapentin (NEURONTIN) 100 mg Oral Capsule Take 1 Capsule (100 mg total) by mouth Three times a day Yes  Yes - ordered 300 mg tid inpatient     hydrOXYzine pamoate (VISTARIL) 25 mg Oral Capsule Take 1 Capsule (25 mg total) by mouth Four times a day as needed for Itching Yes  Yes - ordered 50 mg qid prn inpatient      insulin lispro 100 units/mL Subcutaneous Injectable Inject 0-12 Units under the skin Four times a day as needed for Other   Yes  not on MAR from [facility]   insulin NPH isoph U-100 human 100 unit/mL Subcutaneous Suspension Inject 15 Units under the skin Every morning before breakfast  Patient taking differently: Inject 15 Units under the skin Every morning before breakfast and 25 units before dinner Yes  No     insulin regular human (HUMULIN R) 100 unit/mL Injection Solution Inject 12 Units under the skin Three times a day  Patient taking differently: Inject 5 Units under the skin Three times a day Yes  No     levothyroxine (SYNTHROID) 88 mcg Oral Tablet Take 1 Tablet (88 mcg total) by mouth Every morning Yes  Yes     melatonin 3 mg Oral Tablet Take 2 Tablets (6 mg total) by mouth Every night Yes  Yes - ordered 4.5 mg nightly inpatient     Mirtazapine (REMERON) 7.5 mg Oral Tablet Take 1 Tablet (7.5 mg total) by mouth Every night Yes  Yes     MULTIVITAMIN ORAL Take 1 Tablet by mouth Once a day Yes  No  oxyCODONE (ROXICODONE) 5 mg Oral Tablet Take 1 Tablet (5 mg total) by mouth Every 4 hours as needed for Pain Yes  Yes     pantoprazole (PROTONIX) 40 mg Oral Tablet, Delayed Release (E.C.) Take 1 Tablet (40 mg total) by mouth Twice daily 30 minutes before a meal Yes  Yes     polyethylene glycol (MIRALAX) 17 gram/dose Oral Powder Take 3 teaspoons (17 g total) by mouth Once a day Yes  No     POTASSIUM ORAL Take 1 Tablet by mouth Once a day   No  not on MAR from [facility]   prochlorperazine (COMPAZINE) 10 mg/2 mL (5 mg/mL) Injection Solution Infuse 2 mL (10 mg total) into a venous catheter Every 6 hours as needed Yes  No     promethazine (PHENERGAN) 12.5 mg Oral Tablet Take 0.5 Tablets (6.25 mg total) by mouth Every 6 hours as needed for Nausea/Vomiting (refractory to other medications)   Yes  not on MAR from [facility]   scopolamine (TRANSDERM SCOP) 1 mg over 3 days Transdermal patch 3 day Place 1 Patch on the skin  Every 72 hours Yes  No     sennosides-docusate sodium (SENOKOT-S) 8.6-50 mg Oral Tablet Take 1 Tablet by mouth Twice daily Yes  Yes         Did patient's home medication list require updates? Yes    Medications UPDATED on Prior to Admission Med List:  -acetaminophen '500mg'$ : 2Q4h changed to '650mg'$ : 1 Q6H  -amlodipine '5mg'$  changed to '10mg'$   -gabapentin '300mg'$  changed to '100mg'$   -insulin NPH: 15 units AM changed to 15 units AM & 25 units before dinner  -humulin R: 12 units TID changed to 5 units TID  -melatonin '5mg'$  changed to '6mg'$     Medications ADDED to Prior to Admission Med List:  -hydrOXYzine pamoate (VISTARIL) 25 mg Oral Capsule  -Mirtazapine (REMERON) 7.5 mg Oral Tablet  -oxyCODONE (ROXICODONE) 5 mg Oral Tablet  -polyethylene glycol (MIRALAX) 17 gram/dose Oral Powder  -scopolamine (TRANSDERM SCOP) 1 mg over 3 days Transdermal patch 3 day    Allergies:    No Known Allergies    Danton Sewer, Pharmacy Technician      Did pharmacist make suggestions for medication reconciliation? Yes    Medications REMOVED from home medication list:  - biotin  - cetirizine  - fluconazole (expired)  - meropenem (expired)  - potassium  - promethazine  - vancomycin (expired)     Pharmacist Recommendations:  1. Consider restarting home medications as clinically appropriated.  2. Consider updating inpatient orders to match outpatient orders (differences in dose/regimen are in red).      Scarlette Calico, PHARMD

## 2021-06-23 NOTE — Care Management Notes (Signed)
Reinstatement referral was submitted to [facility].

## 2021-06-23 NOTE — Nurses Notes (Signed)
Patient complaining that he has not been able to sleep all night, during hourly rounds patient visibly sleeping RR WNL and softly snoring.

## 2021-06-23 NOTE — Nurses Notes (Signed)
Service aware of patient requesting to eat and GI at the [facility] and stated will do procedure tomorrow.  Resident stated that she will look into eating.

## 2021-06-24 ENCOUNTER — Inpatient Hospital Stay (HOSPITAL_BASED_OUTPATIENT_CLINIC_OR_DEPARTMENT_OTHER): Payer: 59 | Admitting: Anesthesiology

## 2021-06-24 ENCOUNTER — Inpatient Hospital Stay (HOSPITAL_COMMUNITY): Payer: 59 | Admitting: Anesthesiology

## 2021-06-24 ENCOUNTER — Encounter (HOSPITAL_COMMUNITY)
Admission: EM | Disposition: A | Discharge status: Home Health | Payer: Self-pay | Source: Home / Self Care | Attending: Internal Medicine

## 2021-06-24 DIAGNOSIS — Z86711 Personal history of pulmonary embolism: Secondary | ICD-10-CM

## 2021-06-24 DIAGNOSIS — Z4659 Encounter for fitting and adjustment of other gastrointestinal appliance and device: Secondary | ICD-10-CM

## 2021-06-24 DIAGNOSIS — Z90411 Acquired partial absence of pancreas: Secondary | ICD-10-CM

## 2021-06-24 DIAGNOSIS — M4854XA Collapsed vertebra, not elsewhere classified, thoracic region, initial encounter for fracture: Secondary | ICD-10-CM

## 2021-06-24 DIAGNOSIS — Z79899 Other long term (current) drug therapy: Secondary | ICD-10-CM

## 2021-06-24 DIAGNOSIS — K863 Pseudocyst of pancreas: Secondary | ICD-10-CM

## 2021-06-24 DIAGNOSIS — E1142 Type 2 diabetes mellitus with diabetic polyneuropathy: Secondary | ICD-10-CM

## 2021-06-24 DIAGNOSIS — E785 Hyperlipidemia, unspecified: Secondary | ICD-10-CM

## 2021-06-24 DIAGNOSIS — Z794 Long term (current) use of insulin: Secondary | ICD-10-CM

## 2021-06-24 DIAGNOSIS — R451 Restlessness and agitation: Secondary | ICD-10-CM

## 2021-06-24 DIAGNOSIS — Z9081 Acquired absence of spleen: Secondary | ICD-10-CM

## 2021-06-24 DIAGNOSIS — K8689 Other specified diseases of pancreas: Secondary | ICD-10-CM

## 2021-06-24 DIAGNOSIS — R627 Adult failure to thrive: Secondary | ICD-10-CM

## 2021-06-24 DIAGNOSIS — I10 Essential (primary) hypertension: Secondary | ICD-10-CM

## 2021-06-24 DIAGNOSIS — R109 Unspecified abdominal pain: Secondary | ICD-10-CM

## 2021-06-24 DIAGNOSIS — E039 Hypothyroidism, unspecified: Secondary | ICD-10-CM

## 2021-06-24 DIAGNOSIS — Z9049 Acquired absence of other specified parts of digestive tract: Secondary | ICD-10-CM

## 2021-06-24 DIAGNOSIS — Z8673 Personal history of transient ischemic attack (TIA), and cerebral infarction without residual deficits: Secondary | ICD-10-CM

## 2021-06-24 DIAGNOSIS — E43 Unspecified severe protein-calorie malnutrition: Secondary | ICD-10-CM

## 2021-06-24 DIAGNOSIS — F419 Anxiety disorder, unspecified: Secondary | ICD-10-CM

## 2021-06-24 HISTORY — PX: GASTROSCOPY: WVUENDOPRO49

## 2021-06-24 LAB — CBC WITH DIFF
HCT: 25.5 % — ABNORMAL LOW (ref 38.9–52.0)
HGB: 8.3 g/dL — ABNORMAL LOW (ref 13.4–17.5)
MCH: 26.7 pg (ref 26.0–32.0)
MCHC: 32.5 g/dL (ref 31.0–35.5)
MCV: 82 fL (ref 78.0–100.0)
MPV: 12.7 fL — ABNORMAL HIGH (ref 8.7–12.5)
PLATELETS: 476 10*3/uL — ABNORMAL HIGH (ref 150–400)
RBC: 3.11 10*6/uL — ABNORMAL LOW (ref 4.50–6.10)
RDW-CV: 19.5 % — ABNORMAL HIGH (ref 11.5–15.5)
WBC: 16.1 10*3/uL — ABNORMAL HIGH (ref 3.7–11.0)

## 2021-06-24 LAB — POC BLOOD GLUCOSE (RESULTS)
GLUCOSE, POC: 185 mg/dl — ABNORMAL HIGH (ref 70–105)
GLUCOSE, POC: 149 mg/dl — ABNORMAL HIGH (ref 70–105)
GLUCOSE, POC: 104 mg/dl (ref 70–105)
GLUCOSE, POC: 104 mg/dl (ref 70–105)
GLUCOSE, POC: 107 mg/dl — ABNORMAL HIGH (ref 70–105)

## 2021-06-24 LAB — MANUAL DIFF AND MORPHOLOGY-SYSMEX
BASOPHIL #: 0.16 10*3/uL (ref <=0.20)
BASOPHIL %: 1 %
EOSINOPHIL #: 0.16 10*3/uL (ref <=0.50)
EOSINOPHIL %: 1 %
LYMPHOCYTE #: 6.12 10*3/uL — ABNORMAL HIGH (ref 1.00–4.80)
LYMPHOCYTE %: 38 %
MONOCYTE #: 0.64 10*3/uL (ref 0.20–1.10)
MONOCYTE %: 4 %
NEUTROPHIL #: 9.02 10*3/uL — ABNORMAL HIGH (ref 1.50–7.70)
NEUTROPHIL %: 56 %

## 2021-06-24 LAB — BASIC METABOLIC PANEL
ANION GAP: 9 mmol/L (ref 4–13)
BUN/CREA RATIO: 23 — ABNORMAL HIGH (ref 6–22)
BUN: 26 mg/dL — ABNORMAL HIGH (ref 8–25)
CALCIUM: 10 mg/dL (ref 8.8–10.2)
CHLORIDE: 110 mmol/L (ref 96–111)
CO2 TOTAL: 23 mmol/L (ref 23–31)
CREATININE: 1.11 mg/dL (ref 0.75–1.35)
ESTIMATED GFR: 69 mL/min/BSA (ref >=60)
GLUCOSE: 124 mg/dL (ref 65–125)
POTASSIUM: 4 mmol/L (ref 3.5–5.1)
SODIUM: 142 mmol/L (ref 136–145)

## 2021-06-24 LAB — PHOSPHORUS: PHOSPHORUS: 3.2 mg/dL (ref 2.3–4.0)

## 2021-06-24 LAB — HEPATIC FUNCTION PANEL
ALBUMIN: 3 g/dL — ABNORMAL LOW (ref 3.4–4.8)
ALKALINE PHOSPHATASE: 82 U/L (ref 45–115)
ALT (SGPT): 15 U/L (ref 10–55)
AST (SGOT): 24 U/L (ref 8–45)
BILIRUBIN DIRECT: 0.1 mg/dL (ref 0.1–0.4)
BILIRUBIN TOTAL: 0.3 mg/dL (ref 0.3–1.3)
PROTEIN TOTAL: 7.2 g/dL (ref 6.0–8.0)

## 2021-06-24 LAB — C-REACTIVE PROTEIN(CRP),INFLAMMATION: CRP INFLAMMATION: 22.4 mg/L — ABNORMAL HIGH (ref <8.0)

## 2021-06-24 LAB — MAGNESIUM: MAGNESIUM: 1.9 mg/dL (ref 1.8–2.6)

## 2021-06-24 SURGERY — NECROSECTOMY
Anesthesia: Monitor Anesthesia Care | Site: Mouth | Wound class: Clean Contaminated Wounds-The respiratory, GI, Genital, or urinary

## 2021-06-24 MED ORDER — TRAZODONE 50 MG TABLET
25.0000 mg | ORAL_TABLET | Freq: Once | ORAL | Status: AC
Start: 2021-06-24 — End: 2021-06-24
  Administered 2021-06-24: 25 mg via ORAL
  Filled 2021-06-24: qty 1

## 2021-06-24 MED ORDER — SODIUM CHLORIDE 0.9% FLUSH BAG - 250 ML
INTRAVENOUS | Status: DC | PRN
Start: 2021-06-24 — End: 2021-06-27

## 2021-06-24 MED ORDER — PHENYLEPHRINE 1 MG/10 ML (100 MCG/ML) IN 0.9 % SOD.CHLORIDE IV SYRINGE
INJECTION | Freq: Once | INTRAVENOUS | Status: DC | PRN
Start: 2021-06-24 — End: 2021-06-24
  Administered 2021-06-24 (×5): 200 ug via INTRAVENOUS

## 2021-06-24 MED ORDER — CHOLECALCIFEROL (VITAMIN D3) 25 MCG (1,000 UNIT) TABLET
1000.0000 [IU] | ORAL_TABLET | Freq: Every day | ORAL | Status: DC
Start: 2021-06-25 — End: 2021-06-27
  Administered 2021-06-25 – 2021-06-27 (×3): 1000 [IU] via ORAL
  Filled 2021-06-24 (×3): qty 1

## 2021-06-24 MED ORDER — LACTATED RINGERS INTRAVENOUS SOLUTION
INTRAVENOUS | Status: DC
Start: 2021-06-24 — End: 2021-06-27

## 2021-06-24 MED ORDER — DEXTROSE 5% IN WATER (D5W) FLUSH BAG - 250 ML
INTRAVENOUS | Status: DC | PRN
Start: 2021-06-24 — End: 2021-06-27

## 2021-06-24 MED ORDER — LACTATED RINGERS INTRAVENOUS SOLUTION
INTRAVENOUS | Status: DC | PRN
Start: 2021-06-24 — End: 2021-06-24

## 2021-06-24 MED ORDER — WATER FOR INJECTION, STERILE INTRAVENOUS SOLUTION
INTRAVENOUS | Status: AC
Start: 2021-06-24 — End: 2021-06-25
  Filled 2021-06-24: qty 1000

## 2021-06-24 MED ORDER — MELATONIN 3 MG TABLET
9.0000 mg | ORAL_TABLET | Freq: Every evening | ORAL | Status: DC
Start: 2021-06-24 — End: 2021-06-27
  Administered 2021-06-24 – 2021-06-26 (×3): 9 mg via ORAL
  Filled 2021-06-24 (×3): qty 3

## 2021-06-24 MED ORDER — LACTATED RINGERS INTRAVENOUS SOLUTION
Freq: Once | INTRAVENOUS | Status: AC
Start: 2021-06-24 — End: 2021-06-24

## 2021-06-24 MED ORDER — SODIUM CHLORIDE 0.9 % (FLUSH) INJECTION SYRINGE
2.0000 mL | INJECTION | INTRAMUSCULAR | Status: DC | PRN
Start: 2021-06-24 — End: 2021-06-27

## 2021-06-24 MED ORDER — TRAZODONE 50 MG TABLET
50.0000 mg | ORAL_TABLET | Freq: Once | ORAL | Status: DC
Start: 2021-06-24 — End: 2021-06-24

## 2021-06-24 MED ORDER — PROPOFOL 10 MG/ML IV BOLUS
INJECTION | Freq: Once | INTRAVENOUS | Status: DC | PRN
Start: 2021-06-24 — End: 2021-06-24
  Administered 2021-06-24: 50 mg via INTRAVENOUS

## 2021-06-24 MED ORDER — SODIUM CHLORIDE 0.9 % (FLUSH) INJECTION SYRINGE
2.0000 mL | INJECTION | Freq: Three times a day (TID) | INTRAMUSCULAR | Status: DC
Start: 2021-06-24 — End: 2021-06-27
  Administered 2021-06-24: 2 mL
  Administered 2021-06-24: 0 mL
  Administered 2021-06-25 (×2): 6 mL
  Administered 2021-06-25: 2 mL
  Administered 2021-06-26: 0 mL
  Administered 2021-06-26 – 2021-06-27 (×3): 6 mL
  Administered 2021-06-27: 0 mL

## 2021-06-24 MED ORDER — PROPOFOL 10 MG/ML INTRAVENOUS EMULSION
INTRAVENOUS | Status: DC | PRN
Start: 2021-06-24 — End: 2021-06-24
  Administered 2021-06-24: 0 ug/kg/min via INTRAVENOUS
  Administered 2021-06-24: 100 ug/kg/min via INTRAVENOUS

## 2021-06-24 MED ORDER — LIDOCAINE (PF) 100 MG/5 ML (2 %) INTRAVENOUS SYRINGE
INJECTION | Freq: Once | INTRAVENOUS | Status: DC | PRN
Start: 2021-06-24 — End: 2021-06-24
  Administered 2021-06-24: 50 mg via INTRAVENOUS

## 2021-06-24 SURGICAL SUPPLY — 4 items
DEVICE SPEC RETR RPTR 2.4MM 230CM HBRD JAW FLXB DIST WRE GRSP LF  DISP (ENDOSCOPIC SUPPLIES) ×1 IMPLANT
DEVICE SPEC RETR RPTR 2.4MM 23_0CM HBRD JAW FLXB DIST WRE (INSTRUMENTS ENDOMECHANICAL) ×1
KIT ENDO (INSTRUMENTS ENDOMECHANICAL) ×1
KIT ENDOS CMPLN ENDOKIT ORCAPOD 4 1.1OZ (ENDOSCOPIC SUPPLIES) ×1 IMPLANT

## 2021-06-24 NOTE — Progress Notes (Signed)
[facility]  Medicine Progress Note    Tyrelle Raczka  Date of service: 06/24/2021  Date of Admission:  06/21/2021    Hospital Day:  LOS: 2 days     Subjective:  Patient complaining of restlessness and did not sleep well last night.  Also reports seeing things in his room that are not there.  Patient also shaky. No abdominal pain, N/V.  NPO currently for procedure.       Medications:   Current Facility-Administered Medications   Medication Dose Route Frequency   . acetaminophen (TYLENOL) tablet  975 mg Oral Q6H PRN   . adult custom variable rate parenteral nutrition   Intravenous Continuous   . amLODIPine (NORVASC) tablet  5 mg Oral Daily   . apixaban (ELIQUIS) tablet  2.5 mg Oral 2x/day   . atorvastatin (LIPITOR) tablet  40 mg Oral Daily with Breakfast   . cefepime (MAXIPIME) 2 g in NS 100 mL IVPB minibag  2 g Intravenous Q12H   . cetirizine (ZYRTEC) tablet  10 mg Oral Daily   . cyanocobalamin (VITAMIN B12) tablet  1,000 mcg Oral QAM   . D5W 250 mL flush bag   Intravenous Q15 Min PRN   . elvitegravir-cobicistat-emtricitrabine-tenofovir (GENVOYA) 150-150-200-10 mg per tablet  1 Tablet Oral Daily   . gabapentin (NEURONTIN) capsule  300 mg Oral 2x/day   . gabapentin (NEURONTIN) capsule  600 mg Oral QPM   . hydrOXYzine pamoate (VISTARIL) capsule  50 mg Oral 4x/day PRN   . insulin glargine-yfgn 100 units/mL injection  25 Units Subcutaneous NIGHTLY   . levothyroxine (SYNTHROID) tablet  88 mcg Oral QAM   . melatonin tablet  9 mg Oral NIGHTLY   . mirtazapine (REMERON) tablet  7.5 mg Oral NIGHTLY   . NS 250 mL flush bag   Intravenous Q15 Min PRN   . NS flush syringe  2-6 mL Intracatheter Q8HRS   . NS flush syringe  2-6 mL Intracatheter Q1 MIN PRN   . oxyCODONE (ROXICODONE) immediate release tablet  5 mg Oral Q4H PRN   . pantoprazole (PROTONIX) delayed release tablet  40 mg Oral 2x/day   . promethazine (PHENERGAN) tablet  6.25 mg Oral Q6H PRN   . sennosides-docusate sodium (SENOKOT-S) 8.6-'50mg'$  per tablet   1 Tablet Oral 2x/day   . SSIP insulin lispro 100 units/mL injection  0-12 Units Subcutaneous 4x/day PRN         Vital Signs:  Temp  Avg: 37 C (98.6 F)  Min: 36.6 C (97.9 F)  Max: 37.5 C (99.5 F)    Pulse  Avg: 100.5  Min: 92  Max: 106 BP  Min: 115/57  Max: 137/75   Resp  Avg: 16.3  Min: 16  Max: 17 SpO2  Avg: 93 %  Min: 91 %  Max: 95 %            Physical Exam:  General Appearance: Restless chronically ill-appearing, and vital signs reviewed  Eyes: Conjunctiva clear, pupils equal and round, sclera non-icteric, EOMI  Neck: Supple, trachea midline  Lungs: Clear to auscultation bilaterally, no wheezes or crackles, breathing comfortably  Heart: S1S2, RRR, no murmur     Abdomen: Soft, non-tender, non-distended, normoactive bowel sounds  Extremities/MSK: No peripheral edema, moves all 4 extremities  Integumentary:  Skin warm and dry, no rashes or visible lesions  Neurologic: Grossly normal, alert, no focal neurologic deficits  Psychiatric: Mood/affect appropriate, speech normal      Labs:  I reviewed labs  Radiology:  I reviewed imaging       PT/OT: Yes    Consults: GI, Surgery Oncology, Wound Care, Psychiatry, Rheumatology, Orthopedics        Assessment/ Plan:   Active Hospital Problems    Diagnosis   . Primary Problem: Pneumonia     Nic Lampe is a 76 y.o. male with 76 y.o.malewith h/oHIV infection, hypertension, hyperlipidemia. 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  Would need IR BX of vertebrae.  Patient and  family would like to hold off for now.  Follow up in 2 weeks with repeat XR T-spien AP/lat  - Metabolic bone consulted   - Recommend Bone Scan as this could be a pathologic fracture from bone metastases   - Vitamin D level 42.  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 Patient has outpatient new patient visit to neurology movement disorders clinic in 8/23    Adult failure to thrive  severe protein calorie malnutrition  - Continue TPN  - Nutrition consult and MNT protocol    HTN  HLD  - Continue amlodipine 5 mg oral daily  - Continue Lipitor 40 mg oral daily    Insulin-dependent DMII with peripheral neuropathy  - Last HbA1c was 9.3 on 03/17/2021  - Continue Lantus 25 units nightly and SSI  - Continue home neurontin  - Diabetic diet  - Nutrition consult and MNT protocol ordered    History of stroke and Pulmonary embolism  - Continue Eliquis 2.5 mg oral BID    Mild protein calorie malnutrition on TPN  - Albumin 3.2  - Nutrition consult and MNT protocol  - Continue vitamin B12    Hypothyroidism  - Continue Synthroid 88 mcg daily       DVT/PE Prophylaxis: Apixaban    Disposition Planning: Home discharge        Allie Bossier, DO

## 2021-06-24 NOTE — Consults (Signed)
[facility]  Surgery Oncology Initial Consult       [name], [name], 76 y.o. male  Date of Admission:  06/21/2021  Date of service: 06/24/2021  Date of Birth:  Sep 06, 1945    PCP: [facility] Requested By: Westley Hummer, DO    Chief Complaint: SOB    HISTORY OF PRESENT ILLNESS:  [name] is a 76 y.o. 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 The patient was diagnosed with PE and started on Eliquis. He presented to outside hospital with abdominal pain and was found to have active extravasation at the surgical site on CT imaging. He underwent mass transfusion protocol and ex lap on 04/03/21 with ligation of bleeding vessel at the pancreatectomy bed. He was transferred to [facility] and postoperatively developed a midline fistula. Patient was admitted due to pneumonia and a T8 compression fracture found on CT imaging. Today, patient has mild abdominal pain he denies any fever or chills. Pain in located in his left side and is sharp in nature.       Past Medical History:   Diagnosis Date   . Cancer (CMS [facility])     prostate   . Deep vein thrombosis (DVT) (CMS HCC)     right leg   . Esophageal reflux    . H/O hearing loss    . High cholesterol    . 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 HTN (hypertension)    . 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 Hyperlipidemia     "borderline"   . Hypothyroidism    . MRSA (methicillin resistant staph aureus) culture positive 01/02/2021    MRSA left groin abscess 01/03/21   . MRSA (methicillin resistant staph aureus) culture positive 01/02/2021    MRSA blood 01/02/21   . Pulmonary embolism (CMS HCC) 03/31/2021   . Thyroid disorder    . Wears glasses           No Known Allergies   Medications Prior to Admission     Prescriptions    acetaminophen (TYLENOL) 325 mg Oral Tablet    Take 2 Tablets (650 mg total) by mouth Every 6  hours as needed for Pain    amLODIPine (NORVASC) 10 mg Oral Tablet    Take 1 Tablet (10 mg total) by mouth Once a day    apixaban (ELIQUIS) 2.5 mg Oral Tablet    Take 1 Tablet (2.5 mg total) by mouth Twice daily for 90 days    Patient taking differently:  Take 1 Tablet (2.5 mg total) by mouth Twice daily Reduced dose is due to drug interaction with Genvoya.    atorvastatin (LIPITOR) 80 mg Oral Tablet    Take 0.5 Tablets (40 mg total) by mouth Every morning with breakfast    Blood Sugar Diagnostic (ACCU-CHEK GUIDE TEST STRIPS) Strip    1 Strip Four times a day - before meals and bedtime    cyanocobalamin (VITAMIN B 12) 1,000 mcg Oral Tablet    Take 1 Tablet (1,000 mcg total) by mouth Every morning    elviteg-cob-emtri-tenof ALAFEN (GENVOYA) 150-150-200-10 mg Oral Tablet    Take 1 Tablet by mouth Once a day    flash glucose scanning reader (FREESTYLE LIBRE 2 READER) Does not apply Misc    Use as directed with FreeStyle Libre 2 sensors  flash glucose sensor (FREESTYLE LIBRE 2 SENSOR) Does not apply Kit    To check blood glucose continuously. Change every 14 days    flash glucose sensor (FREESTYLE LIBRE 2 SENSOR) Does not apply Kit    To check blood glucose continuously. Change every 14 days    gabapentin (NEURONTIN) 100 mg Oral Capsule    Take 1 Capsule (100 mg total) by mouth Three times a day    hydrOXYzine pamoate (VISTARIL) 25 mg Oral Capsule    Take 1 Capsule (25 mg total) by mouth Four times a day as needed for Itching    insulin lispro 100 units/mL Subcutaneous Injectable    Inject 0-12 Units under the skin Four times a day as needed for Other    insulin NPH isoph U-100 human 100 unit/mL Subcutaneous Suspension    Inject 15 Units under the skin Every morning before breakfast    Patient taking differently:  Inject 15 Units under the skin Every morning before breakfast and 25 units before dinner    insulin regular human (HUMULIN R) 100 unit/mL Injection Solution    Inject 12 Units under the skin Three times a  day    Patient taking differently:  Inject 5 Units under the skin Three times a day    lancets (FREESTYLE LANCETS) 28 gauge Misc    Use to test blood sugar 4 times a day - before meals and bedtime    levothyroxine (SYNTHROID) 88 mcg Oral Tablet    Take 1 Tablet (88 mcg total) by mouth Every morning    melatonin 3 mg Oral Tablet    Take 2 Tablets (6 mg total) by mouth Every night    Mirtazapine (REMERON) 7.5 mg Oral Tablet    Take 1 Tablet (7.5 mg total) by mouth Every night    MULTIVITAMIN ORAL    Take 1 Tablet by mouth Once a day    oxyCODONE (ROXICODONE) 5 mg Oral Tablet    Take 1 Tablet (5 mg total) by mouth Every 4 hours as needed for Pain    pantoprazole (PROTONIX) 40 mg Oral Tablet, Delayed Release (E.C.)    Take 1 Tablet (40 mg total) by mouth Twice daily 30 minutes before a meal    polyethylene glycol (MIRALAX) 17 gram/dose Oral Powder    Take 3 teaspoons (17 g total) by mouth Once a day    prochlorperazine (COMPAZINE) 10 mg/2 mL (5 mg/mL) Injection Solution    Infuse 2 mL (10 mg total) into a venous catheter Every 6 hours as needed    scopolamine (TRANSDERM SCOP) 1 mg over 3 days Transdermal patch 3 day    Place 1 Patch on the skin Every 72 hours    sennosides-docusate sodium (SENOKOT-S) 8.6-50 mg Oral Tablet    Take 1 Tablet by mouth Twice daily         acetaminophen (TYLENOL) tablet, 975 mg, Oral, Q6H PRN  adult custom variable rate parenteral nutrition, , Intravenous, Continuous  adult custom variable rate parenteral nutrition, , Intravenous, Continuous  amLODIPine (NORVASC) tablet, 5 mg, Oral, Daily  apixaban (ELIQUIS) tablet, 2.5 mg, Oral, 2x/day  atorvastatin (LIPITOR) tablet, 40 mg, Oral, Daily with Breakfast  cefepime (MAXIPIME) 2 g in NS 100 mL IVPB minibag, 2 g, Intravenous, Q12H  cetirizine (ZYRTEC) tablet, 10 mg, Oral, Daily  cyanocobalamin (VITAMIN B12) tablet, 1,000 mcg, Oral, QAM  D5W 250 mL flush bag, , Intravenous, Q15 Min PRN  elvitegravir-cobicistat-emtricitrabine-tenofovir (GENVOYA)  150-150-200-10 mg per tablet, 1 Tablet, Oral, Daily  gabapentin (NEURONTIN) capsule, 300 mg, Oral, 2x/day  gabapentin (NEURONTIN) capsule, 600 mg, Oral, QPM  hydrOXYzine pamoate (VISTARIL) capsule, 50 mg, Oral, 4x/day PRN  insulin glargine-yfgn 100 units/mL injection, 25 Units, Subcutaneous, NIGHTLY  levothyroxine (SYNTHROID) tablet, 88 mcg, Oral, QAM  melatonin tablet, 9 mg, Oral, NIGHTLY  mirtazapine (REMERON) tablet, 7.5 mg, Oral, NIGHTLY  NS 250 mL flush bag, , Intravenous, Q15 Min PRN  NS flush syringe, 2-6 mL, Intracatheter, Q8HRS  NS flush syringe, 2-6 mL, Intracatheter, Q1 MIN PRN  oxyCODONE (ROXICODONE) immediate release tablet, 5 mg, Oral, Q4H PRN  pantoprazole (PROTONIX) delayed release tablet, 40 mg, Oral, 2x/day  promethazine (PHENERGAN) tablet, 6.25 mg, Oral, Q6H PRN  sennosides-docusate sodium (SENOKOT-S) 8.6-50mg per tablet, 1 Tablet, Oral, 2x/day  SSIP insulin lispro 100 units/mL injection, 0-12 Units, Subcutaneous, 4x/day PRN       Past Surgical History:   Procedure Laterality Date   . COLON SURGERY      per patient had part of colon removed and had a colostomy   . COLONOSCOPY     . GASTROSCOPY     . 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 Parke Simmers   . [place]  2001   . HX COLOSTOMY REVERSAL     . HX HERNIA REPAIR     . KNEE SURGERY Bilateral    . WRIST SURGERY Right           Family Medical History:     Problem Relation (Age of Onset)    Cancer Mother, Father, Other    Heart Attack Mother    High Cholesterol Other    Stroke Other           Social History     Tobacco Use   . Smoking status: Never   . Smokeless tobacco: Never   Vaping Use   . Vaping Use: Never used   Substance Use Topics   . Alcohol use: Not Currently   . Drug use: Never        REVIEW OF SYSTEMS: MUST comment on all "Abnormal" findings  ROS Other than ROS in the HPI, all other systems were negative.    PHYSICAL EXAM:  Temperature: 36.8 C (98.2  F)  Heart Rate: (!) 104  BP (Non-Invasive): 134/64  Respiratory Rate: 16  SpO2: 95 %  Constitutional:  no distress  Respiratory:  Clear to auscultation bilaterally.   Cardiovascular:  regular rate and rhythm  Gastrointestinal:  S, ND, mild TTP in RUQ, superior portion of midline open with packing intact, inferior portion of wound open with shallow bed and granulation tissue  Musculoskeletal:  AROM and PROM    DIAGNOSTIC DATA:  Reviewed: Labs:  I have reviewed all lab results.  Lab Results Today:    Results for orders placed or performed during the hospital encounter of 06/21/21 (from the past 24 hour(s))   POC BLOOD GLUCOSE (RESULTS)   Result Value Ref Range    GLUCOSE, POC 128 (H) 70 - 105 mg/dl   POC BLOOD GLUCOSE (RESULTS)   Result Value Ref Range    GLUCOSE, POC 106 (H) 70 - 105 mg/dl   POC BLOOD GLUCOSE (RESULTS)   Result Value Ref Range    GLUCOSE, POC 185 (H) 70 - 105 mg/dl   BASIC METABOLIC PANEL   Result Value Ref Range    SODIUM 142 136 - 145 mmol/L    POTASSIUM 4.0 3.5 - 5.1  mmol/L    CHLORIDE 110 96 - 111 mmol/L    CO2 TOTAL 23 23 - 31 mmol/L    ANION GAP 9 4 - 13 mmol/L    CALCIUM 10.0 8.8 - 10.2 mg/dL    GLUCOSE 124 65 - 125 mg/dL    BUN 26 (H) 8 - 25 mg/dL    CREATININE 1.11 0.75 - 1.35 mg/dL    BUN/CREA RATIO 23 (H) 6 - 22    ESTIMATED GFR 69 >=60 mL/min/BSA   C-REACTIVE PROTEIN(CRP),INFLAMMATION   Result Value Ref Range    CRP INFLAMMATION 22.4 (H) <8.0 mg/L   HEPATIC FUNCTION PANEL   Result Value Ref Range    ALBUMIN 3.0 (L) 3.4 - 4.8 g/dL     ALKALINE PHOSPHATASE 82 45 - 115 U/L    ALT (SGPT) 15 10 - 55 U/L    AST (SGOT)  24 8 - 45 U/L    BILIRUBIN TOTAL 0.3 0.3 - 1.3 mg/dL    BILIRUBIN DIRECT 0.1 0.1 - 0.4 mg/dL    PROTEIN TOTAL 7.2 6.0 - 8.0 g/dL   MAGNESIUM   Result Value Ref Range    MAGNESIUM 1.9 1.8 - 2.6 mg/dL   PHOSPHORUS   Result Value Ref Range    PHOSPHORUS 3.2 2.3 - 4.0 mg/dL   POC BLOOD GLUCOSE (RESULTS)   Result Value Ref Range    GLUCOSE, POC 149 (H) 70 - 105 mg/dl     Imaging  Studies:  CT a/p left fluid collection stable    Impression/Recommendation:   76 y.o. male with compression fracture and pneumonia.     Agree with GI consult  Would not use chemical or mechanical debridement on inferior midline wound given close proximity to bowel.    Allene Pyo, PA-C

## 2021-06-24 NOTE — Anesthesia Preprocedure Evaluation (Signed)
ANESTHESIA PRE-OP EVALUATION  Edwinna Areola  Planned Procedure: NECROSECTOMY (Mouth)  Review of Systems    PONV       patient summary reviewed  nursing notes reviewed        Pulmonary   pneumonia,   Cardiovascular    Hypertension, hyperlipidemia and DVT ,       GI/Hepatic/Renal    GERD and diverticulitis        Endo/Other    hypothyroidism,      Neuro/Psych/MS    depression     Cancer  CA,                     Physical Assessment      Airway       Mallampati: II      Neck ROM: full              Dental           (+) poor dentition           Pulmonary    Breath sounds clear to auscultation  (-) no rhonchi, no decreased breath sounds, no wheezes, no rales and no stridor     Cardiovascular    Rhythm: regular  Rate: Abnormal       Other findings            Plan  ASA 3     Planned anesthesia type: MAC                     Intravenous induction     Anesthesia issues/risks discussed are: Dental Injuries, Stroke, Intraoperative Awareness/ Recall, Aspiration, Cardiac Events/MI and Sore Throat.  Anesthetic plan and risks discussed with patient  Signed consent obtained            Patient's NPO status is appropriate for Anesthesia.           Plan discussed with CRNA.

## 2021-06-24 NOTE — Progress Notes (Addendum)
INTERNAL MEDICINE  Progress Note     Name: [name], [name], 76 y.o. male Date of Admission: 06/21/2021   Date of Birth:  1945/09/14 Date of Service:  06/23/2021   PCP: [facility]  LOS:  LOS: 1 days    Room: 707/A Attending: Ubaldo Glassing     Information Obtained from: Patient and Bedside RN   Chief Complaint: Anxiety restlessness     Code Status: Full Code    SUBJECTIVE  INTERVAL EVENTS:      Laying in bed, reports that he has been unable to sleep and continues to feel very restless. Continues to have abdominal pain on left side of abdomen, cramping, intermittent and severe 10/10. Pain has mostly resolved now but he is worried about it coming back.    No fevers, chills, chest pain, shortness of breath       I/O:       Intake/Output Summary (Last 24 hours) at 06/24/2021 1018  Last data filed at 06/24/2021 0713  Gross per 24 hour   Intake 770 ml   Output 1275 ml   Net -505 ml     Date of Last Bowel Movement: 06/22/21     EXAMINATION:     Temp  Avg: 37 C (98.6 F)  Min: 36.6 C (97.9 F)  Max: 37.5 C (99.5 F)   Pulse  Avg: 100.5  Min: 92  Max: 106 BP  Min: 115/57  Max: 137/75   Resp  Avg: 16.3  Min: 16  Max: 17 SpO2  Avg: 93 %  Min: 91 %  Max: 95 %   Constitutional: NAD. Oriented x 3 Chronically ill elderly male   HENT:   Head: Normocephalic and atraumatic.   Mouth/Throat: Mucous membranes moist.   Eyes: EOMI, PERRL   Neck: Trachea midline. Neck supple.  Cardiovascular: RRR, No murmurs, rubs or gallops.  Pulmonary/Chest: BS equal bilaterally. No respiratory distress. No wheezes, rales or rhonchi.   Abdominal: BS +. Large healing wound in center part of abdomen with clean dressing, no signs of infection, appear to be healing well.         Musculoskeletal/Extremities: No edema, cyanosis, tenderness or deformity.  Skin: Warm and dry. No rash, erythema, pallor or cyanosis   Psychiatric: Anxious and restless, speech is pressured  Neurological: Alert. Grossly intact.      LABS:       CBC BMP   Recent Labs      06/21/21  1709 06/21/21  2155 06/23/21  0452   WBC 14.5* 13.3* 14.5*   HGB 7.8* 7.7* 8.8*   HCT 25.7* 24.6* 27.9*   PLTCNT 503* 511* 501*     Recent Labs     06/21/21  1709 06/21/21  2155 06/23/21  0452   PMNS 49 39 40   LYMPHOCYTES 39 54 49   MONOCYTES '6 6 9   '$ EOSINOPHIL 0 1 2   BASOPHILS 0  <0.10 0  <0.10 0  <0.10   PMNABS 7.11 5.19 5.80   MONOSABS 0.87 0.80 1.31*   EOSABS <0.10 0.13 0.29    Recent Labs     06/21/21  1709 06/21/21  2155 06/23/21  0452   SODIUM 147* 142 143   POTASSIUM 3.6 3.1* 3.4*   CHLORIDE 109 108 111   CO2 '26 26 26   '$ BUN '19 19 15   '$ CREATININE 0.82 0.89 0.99   GFR >90 89 79   ANIONGAP '12 8 6     '$ Recent Labs  06/21/21  1709 06/21/21  2155 06/22/21  0424 06/23/21  0452   CALCIUM 9.5 9.7 9.6 10.5*   ALBUMIN 3.2*  --  3.0* 3.2*   MAGNESIUM 1.9  --   --  2.0   PHOSPHORUS 3.7  --   --  3.2        IMAGING / STUDIES:      Results for orders placed or performed during the hospital encounter of 06/21/21 (from the past 72 hour(s))   CT CHEST ABDOMEN PELVIS W IV CONTRAST     Status: None    Narrative    CT CHEST ABDOMEN PELVIS W IV CONTRAST performed on 06/21/2021 10:50 PM    INDICATION: 76 years old Male  hx pancreatic neoplasm, HIV    TECHNIQUE: Axial images from the thoracic inlet through the pelvis obtained after administration of IV contrast with reformatted coronal and sagittal images, utilizing soft tissue and lung algorithms    CONTRAST: 100 cc of Isovue 370    RADIATION DOSE: 621.08 mGy.cm    COMPARISON: CT abdomen and pelvis dated 06/11/2021. CT chest, abdomen, and pelvis dated 05/24/2021.    FINDINGS:    CHEST:    Lungs/Pleura: The central airways are patent. There is bilateral dependent atelectasis and atelectasis within the right middle lobe and lingula. There is no pleural effusion or pneumothorax.     Heart/Mediastinum: The heart is slightly enlarged, though unchanged. There is no pericardial effusion. There is pectus excavatum. The Haller index measures 2.7. The esophagus is  decompressed. No suspicious mediastinal lymph nodes are seen.    Soft Tissues/Bones: There are no suspicious axillary lymph nodes. There is a left PICC with the tip terminating within the mid SVC. There is a compression deformity of T8. There is irregularity of the T7 superior endplate which is unchanged from prior examinations including the CT on 03/24/2020. This may be degenerative in nature.    ABDOMEN:    Liver: The liver is normal in size. There are subcentimeter low attenuating foci within the left and right hepatic lobes which are stable in appearance.    Gallbladder/Biliary System: The gallbladder is physiologically distended. The common bile duct is prominent in appearance, though unchanged.    Spleen: The spleen is surgically absent.    Pancreas: The patient is status post distal pancreatectomy. There are atrophic changes of the remaining pancreas. There is unchanged cystic area within the pancreatic head. There are inflammatory changes surrounding the pancreas, predominantly within the pancreatic surgical bed. The amount of fat stranding is similar to the prior CT. Small fluid collection within the surgical bed is unchanged (series 9, image 34).    Adrenals: There is no adrenal nodularity.    Kidney/Ureters/Bladder: The kidneys are normal in size and demonstrate symmetrical enhancement. Numerous low attenuating foci within the parenchyma are unchanged from the prior CT. There is no hydronephrosis or hydroureter. The bladder is distended with no asymmetric wall thickening. There is a stone noted within the mid right ureter (series 14, image 29).     Reproductive Organs: The seminal vesicles are symmetric. Prostatic seeds are present.    Stomach/Bowel: The distal esophagus is decompressed. The stomach is decompressed. Stable cystogastrostomy tube and stents in place. The collection along the greater curvature of the stomach has decreased in size from the prior CT. There is no abnormal small or large bowel  dilatation.      Vasculature: The aorta is normal caliber. There are scattered calcific atherosclerotic disease. An IVC filter is  present. There is artifact near the portal confluence, limiting evaluation. Outside of this area no portal portal venous or SMV thrombus is seen.     Peritoneal Cavity/Lymph Nodes: There is no intraperitoneal fluid. There is inflammatory changes within the upper abdomen as described above. No suspicious lymph nodes.    Soft Tissues/Bones: There are no soft tissue fluid collections. The lumbar vertebral body heights are maintained. There are mild degenerative changes of the lumbar spine most prominent at L4-5.        Impression    1.Moderate compression deformity of T8, acute.   2.Right mid ureteral stone without hydronephrosis or hydroureter.  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 The fluid collection along the greater curvature of the stomach is slightly decreased in size. Otherwise postoperative changes are unchanged from 06/11/2021.  4.Resolved left pleural fluid.  5.A few scattered areas of groundglass opacities within the bilateral upper lobes, nonspecific. Differential include atelectasis or infectious/inflammatory process.   CT ANGIO INTRACRANIAL W/WO IV CONTRAST     Status: None    Narrative    [name]  Male, 76 years old.    CT ANGIO INTRACRANIAL W/WO IV CONTRAST performed on 06/21/2021 10:55 PM.    REASON FOR EXAM:  dysarthria, dizziness, BLE weakness  RADIATION DOSE: 1056.64 mGy.cm  CONTRAST: 50 ml of Isovue 370    TECHNIQUE: Standard noncontrast head CT was performed with multiplanar reformatted imaging. CT angiography was then performed of the intracranial arterial vasculature. Multiplanar plus/minus subtracted CT angiographic reformatted imaging was performed.    Bolus Quality :Adequate    COMPARISON: CT angiogram dated 05/25/2021.    FINDINGS:     NON-CONTRAST HEAD CT    There is no mass effect, midline shift, abnormal fluid  collection, or intracranial hemorrhage. There is no cerebellar ectopia or pituitary mass. Focal encephalomalacia in the left parietal lobe. The gray-white matter differentiation is otherwise preserved. The skull, scalp, and orbits show no acute process. The mastoid air cells are well aerated. There is partial opacification of the paranasal sinuses.      INTRACRANIAL CTA    RIGHT Anterior Circulation    The distal cervical internal carotid artery is widely patent. The petrous internal carotid artery is widely patent. The cavernous internal carotid artery is patent. No aneurysms are appreciated. There are calcifications involving the proximal supraclinoid internal carotid artery. The origin of the ophthalmic artery is patent. No abnormalities involving the posterior communicating artery origin are appreciated. There is a tiny posterior communicating artery on this side, but is otherwise unremarkable in appearance.    The right middle cerebral artery is patent. No evident bifurcation aneurysms are appreciated. The proximal M2 segments are patent. The origin of the anterior cerebral artery is patent. The A1 segment is widely patent. No abnormalities of the anterior communicating artery complex are demonstrated. The more distal portions of the anterior cerebral artery are without definitive abnormalities. The anterior communicating artery is patent.    LEFT Anterior Circulation    The distal cervical internal carotid artery is widely patent. The petrous internal carotid artery is widely patent. There are scattered calcifications throughout the cavernous internal carotid artery.  There are calcifications involving the proximal supraclinoid internal carotid artery. The origin of the ophthalmic artery is patent. No abnormalities involving the posterior communicating artery origin are appreciated. There is a tiny posterior communicating artery on this side, but is otherwise unremarkable in appearance.     The left middle  cerebral artery is patent. No  evident bifurcation aneurysms are appreciated. The proximal M2 segments are patent. The origin of the anterior cerebral artery is patent. The A1 segment is widely patent. No abnormalities of the anterior communicating artery complex are demonstrated. The more distal portions of the anterior cerebral artery are without definitive abnormalities. The anterior communicating artery is patent.    VERTEBROBASILAR System    The intradural vertebral arteries are codominant in size. The intradural vertebral arteries are patent without significant stenoses. The basilar artery is patent. The posterior cerebral arteries are patent and are normal in configuration.    OTHER VASCULAR FINDINGS:  The dural venous sinuses are patent with no filling defects to suggest thrombosis.       Impression    1.Mild atherosclerotic changes. No severe stenosis, occlusion, or aneurysm.  2.No acute intracranial process.     CT ANGIO CAROTID-EXTRACRANIAL (NECK) W IV CONTRAST     Status: None    Narrative    [name]  Male, 76 years old.    CT ANGIO CAROTID-EXTRACRANIAL (NECK) W IV CONTRAST performed on 06/21/2021 11:01 PM.    REASON FOR EXAM:  dysarthria, dizziness, BLE weakness  RADIATION DOSE: 186.25 mGy.cm  CONTRAST: 50 ml of Isovue 370    TECHNIQUE: Standard CT angiographic acquisition was performed from the aortic arch through the skull base. Multiplanar reformatted reconstructions were performed.    Bolus Quality :Adequate    COMPARISON: CT angiogram extracranial 05/25/2021    FINDINGS:   Aortic Arch / Doristine Devoid Vessel Origins: These images demonstrate a normal-appearing three-vessel aortic arch without evident atherosclerotic changes involving the aortic arch great vessel origins.    VERTEBROBASILAR SYSTEM    Vertebral Artery Dominance:The vertebral arteries are codominant in size within the neck.    Left Vertebral Artery:  The left vertebral artery origin arises from the left subclavian artery and is  widely patent. Left vertebral artery distal to the origin demonstrates normal course and caliber throughout the neck and its intradural portion.    Right Vertebral Artery:  The right vertebral artery arises from the subclavian artery and is without evident stenoses at its origin. The nonostial right vertebral artery is of normal course and caliber. No high-grade stenoses are appreciated.    RIGHT CAROTID SYSTEM    RIGHT Common Carotid Artery: The origin of the right common carotid artery is patent. There is normal course and caliber of the common carotid artery to the level of the bifurcation.    RIGHT Carotid Bifurcation/ICA:There is mild stenosis at the origin of the right cervical internal carotid artery. The degree of stenosis is estimated at less than 30% on a diameter basis.There is a combination of both hard and soft plaque involving the carotid bifurcation. No deep ulcerations are appreciated.The right cervical internal carotid artery is patent. No evident stenoses are appreciated.    LEFT CAROTID SYSTEM    LEFT Common Carotid Artery:The origin of the left common carotid artery is patent. There is normal course and caliber of the left common carotid artery to the level of the bifurcation.    LEFT Carotid Bifurcation/ICA: There is mild stenosis at the origin of the left cervical internal carotid artery. The degree of stenosis is estimated at less than 30% on a diameter basis.There is a combination of both hard and soft plaque involving the carotid bifurcation. No ulcerations are appreciated.The left cervical internal carotid artery is patent. No evident stenoses are appreciated.    Non-Vascular Findings: Included portions of the lung apices are clear. The  thyroid is homogenous. There are moderate to advanced degenerative changes at C5-C6 and C6-C7.          Impression    Widely patent extracranial arterial vasculature.       XR AP MOBILE CHEST     Status: None    Narrative    [name]  Male,  76 years old.    XR AP MOBILE CHEST performed on 06/21/2021 11:08 PM.    REASON FOR EXAM:  Chest Pain    TECHNIQUE: 1 views/1 images submitted for interpretation.    COMPARISON:  Same day CT chest, abdomen, and pelvis.    FINDINGS:  The heart is at the upper limits of normal for size. There is bibasilar atelectasis. There is no pneumothorax or pleural effusion. There is a left PICC with the tip terminating within the lower SVC. There is a compression deformity of T8.      Impression    1.Bibasilar atelectasis.  2.Compression deformity of T8.   XR UPRIGHT AP/LAT T-SPINE     Status: None    Narrative    [name]  Male, 76 years old.    XR UPRIGHT AP/LAT T-SPINE performed on 06/22/2021 12:53 AM.    REASON FOR EXAM:  Assess for spine alignment and/or stability.    TECHNIQUE: 2 views/2 images submitted for interpretation.    COMPARISON:  CT chest abdomen and pelvis from Jun 21, 2021      Impression    FINDINGS/IMPRESSION: Redemonstration of T8 compression fracture with unchanged height loss and spinal alignment compared to the prior CT.       MICROBIOLOGY / PATHOLOGY:      Hospital Encounter on 06/21/21 (from the past 96 hour(s))   ADULT ROUTINE BLOOD CULTURE, SET OF 2 ADULT BOTTLES (BACTERIA AND YEAST)    Collection Time: 06/21/21 10:20 PM    Specimen: Blood   Culture Result Status    BLOOD CULTURE, ROUTINE No Growth 2 Days Preliminary   ADULT ROUTINE BLOOD CULTURE, SET OF 2 ADULT BOTTLES (BACTERIA AND YEAST)    Collection Time: 06/21/21 11:44 PM    Specimen: Blood   Culture Result Status    BLOOD CULTURE, ROUTINE No Growth 2 Days Preliminary        CURRENT INPATIENT MEDICATION LIST     acetaminophen (TYLENOL) tablet, 975 mg, Oral, Q6H PRN  adult custom variable rate parenteral nutrition, , Intravenous, Continuous  amLODIPine (NORVASC) tablet, 5 mg, Oral, Daily  apixaban (ELIQUIS) tablet, 2.5 mg, Oral, 2x/day  atorvastatin (LIPITOR) tablet, 40 mg, Oral, Daily with Breakfast  cefepime (MAXIPIME) 2 g in NS  100 mL IVPB minibag, 2 g, Intravenous, Q12H  cetirizine (ZYRTEC) tablet, 10 mg, Oral, Daily  cyanocobalamin (VITAMIN B12) tablet, 1,000 mcg, Oral, QAM  D5W 250 mL flush bag, , Intravenous, Q15 Min PRN  elvitegravir-cobicistat-emtricitrabine-tenofovir (GENVOYA) 150-150-200-10 mg per tablet, 1 Tablet, Oral, Daily  gabapentin (NEURONTIN) capsule, 300 mg, Oral, 2x/day  gabapentin (NEURONTIN) capsule, 600 mg, Oral, QPM  hydrOXYzine pamoate (VISTARIL) capsule, 50 mg, Oral, 4x/day PRN  insulin glargine-yfgn 100 units/mL injection, 25 Units, Subcutaneous, NIGHTLY  levothyroxine (SYNTHROID) tablet, 88 mcg, Oral, QAM  melatonin tablet, 9 mg, Oral, NIGHTLY  mirtazapine (REMERON) tablet, 7.5 mg, Oral, NIGHTLY  NS 250 mL flush bag, , Intravenous, Q15 Min PRN  NS flush syringe, 2-6 mL, Intracatheter, Q8HRS  NS flush syringe, 2-6 mL, Intracatheter, Q1 MIN PRN  oxyCODONE (ROXICODONE) immediate release tablet, 5 mg, Oral, Q4H PRN  pantoprazole (PROTONIX) delayed release  tablet, 40 mg, Oral, 2x/day  promethazine (PHENERGAN) tablet, 6.25 mg, Oral, Q6H PRN  sennosides-docusate sodium (SENOKOT-S) 8.6-'50mg'$  per tablet, 1 Tablet, Oral, 2x/day  SSIP insulin lispro 100 units/mL injection, 0-12 Units, Subcutaneous, 4x/day PRN         ASSESSMENT:     [name] is a 77 y.o. male with 76 y.o. male with hx of HIV infection, hypertension, hyperlipidemia. 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 with necrosectomy 05/06/21 who presented to the hospital with anxiety and abdominal pain       PLAN:         Acute T8 compression fracture  - Orthopaedics consultation, ordered Xray   - Metabolic bone consult: Recommend Bone Scan as this could be a pathologic fracture from bone metastases  of pancreatic cancer  - follow up with metabolic bone clinic, start on vitamin D replacement if low   - will need outpatient DEXA    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 Patient has outpatient new patient visit to neurology movement disorders clinic in 8/23    Adult failure to thrive  severe protein calorie malnutrition  - continue TPN  - Nutrition consult and MNT protocol    HTN/HLD  - controlled  - Amlodipine 5 mg oral daily  - continue Lipitor 40 mg oral daily    DMII with peripheral neuropathy  - Continue Lantus 25 units nightly  - SSI Protocol  - Continue home neurontin  - Diabetic diet  - was on TPN at [facility]  - Nutrition consult and MNT protocol ordered    History of stroke and Pulmonary embolism  - continue Eliquis 2.5 mg oral BID    Mild protein calorie malnutrition on TPN  - Albumin 3.2  - Nutrition consult and MNT protocol  - Continue vitamin B12    Hypothyroidism  -continue Synthroid      DVT/PE Prophylaxis - Apixaban  Therapy - OT and PT  Disposition Planning - pending Pt/OT    Ubaldo Glassing DO  Hospitalist Medicine

## 2021-06-24 NOTE — Nurses Notes (Signed)
Page to Dr Alfonse Spruce:  [phone] Menser-pt requesting sleep aid, had Melatonin, Remeron, Oxy and Vistaril at 2010 and pt states he is still "restless" thank you Candi'[phone]'$ 

## 2021-06-24 NOTE — Anesthesia Transfer of Care (Signed)
ANESTHESIA TRANSFER OF CARE   [name] is a 76 y.o. ,male, Weight: 72.6 kg (160 lb 0.9 oz)   had Procedure(s) with comments:  NECROSECTOMY - EGD scope  performed  06/24/21   Primary Service: Celene Skeen, MD    Past Medical History:   Diagnosis Date   . Cancer (CMS [facility])     prostate   . Deep vein thrombosis (DVT) (CMS HCC)     right leg   . Esophageal reflux    . H/O hearing loss    . High cholesterol    . 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 HTN (hypertension)    . 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 Hyperlipidemia     "borderline"   . Hypothyroidism    . MRSA (methicillin resistant staph aureus) culture positive 01/02/2021    MRSA left groin abscess 01/03/21   . MRSA (methicillin resistant staph aureus) culture positive 01/02/2021    MRSA blood 01/02/21   . Pulmonary embolism (CMS HCC) 03/31/2021   . Thyroid disorder    . Wears glasses       Allergy History as of 06/24/21      No Known Allergies              I completed my transfer of care / handoff to the receiving personnel during which we discussed:  Access, Airway, All key/critical aspects of case discussed, Analgesia, Antibiotics, Expectation of post procedure, Fluids/Product, Gave opportunity for questions and acknowledgement of understanding, Labs and PMHx      Post Location: PACU                        Additional Info:VSS, care transferred                                   Last OR Temp: Temperature: 36.1 C (97 F)  ABG:  PH (ARTERIAL)   Date Value Ref Range Status   04/04/2021 7.41 7.35 - 7.45 Final     PH (T)   Date Value Ref Range Status   03/22/2021 7.36 7.35 - 7.45 Final     PCO2 (ARTERIAL)   Date Value Ref Range Status   04/04/2021 41 35 - 45 mm/Hg Final     PCO2 (VENOUS)   Date Value Ref Range Status   05/05/2021 42 41 - 51 mm/Hg Final     PO2 (ARTERIAL)   Date Value Ref Range Status   04/04/2021 77 72 - 100 mm/Hg Final     PO2 (VENOUS)   Date Value Ref Range Status    05/05/2021 58 (H) 35 - 50 mm/Hg Final     SODIUM   Date Value Ref Range Status   03/22/2021 135 (L) 137 - 145 mmol/L Final     POTASSIUM   Date Value Ref Range Status   06/24/2021 4.0 3.5 - 5.1 mmol/L Final     KETONES   Date Value Ref Range Status   06/21/2021 Negative Negative mg/dL Final     WHOLE BLOOD POTASSIUM   Date Value Ref Range Status   03/22/2021 3.6 3.5 - 4.6 mmol/L Final     CHLORIDE   Date Value Ref Range Status   03/22/2021 106 101 - 111 mmol/L Final     CALCIUM   Date  Value Ref Range Status   06/24/2021 10.0 8.8 - 10.2 mg/dL Final     Calculated P Axis   Date Value Ref Range Status   06/21/2021 37 degrees Final     Calculated R Axis   Date Value Ref Range Status   06/21/2021 36 degrees Final     Calculated T Axis   Date Value Ref Range Status   06/21/2021 42 degrees Final     IONIZED CALCIUM   Date Value Ref Range Status   04/19/2021 1.17 1.10 - 1.35 mmol/L Final     LACTATE   Date Value Ref Range Status   05/05/2021 1.8 (H) 0.0 - 1.3 mmol/L Final     HEMOGLOBIN   Date Value Ref Range Status   03/22/2021 11.1 (L) 12.0 - 18.0 g/dL Final     OXYHEMOGLOBIN   Date Value Ref Range Status   03/22/2021 97.8 85.0 - 98.0 % Final     CARBOXYHEMOGLOBIN   Date Value Ref Range Status   03/22/2021 1.8 0.0 - 2.5 % Final     MET-HEMOGLOBIN   Date Value Ref Range Status   03/22/2021 0.4 0.0 - 2.0 % Final     BASE EXCESS   Date Value Ref Range Status   05/05/2021 5.4 (H) -3.0 - 3.0 mmol/L Final     BASE EXCESS (ARTERIAL)   Date Value Ref Range Status   04/04/2021 1.2 (H) 0.0 - 1.0 mmol/L Final     BASE DEFICIT   Date Value Ref Range Status   04/03/2021 0.2 0.0 - 3.0 mmol/L Final     BICARBONATE (ARTERIAL)   Date Value Ref Range Status   04/04/2021 25.8 18.0 - 26.0 mmol/L Final     BICARBONATE (VENOUS)   Date Value Ref Range Status   05/05/2021 29.0 (H) 22.0 - 26.0 mmol/L Final     TEMPERATURE, COMP   Date Value Ref Range Status   03/22/2021 36.9 15.0 - 40.0 C Final     %FIO2 (VENOUS)   Date Value Ref Range Status    05/05/2021 36.0 % Final     Airway:* No LDAs found *  Blood pressure (!) 106/57, pulse 87, temperature 36.1 C (97 F), resp. rate 12, height 1.778 m ('5\' 10"'$ ), weight 72.6 kg (160 lb 0.9 oz), SpO2 99 %.

## 2021-06-24 NOTE — Progress Notes (Signed)
Brief procedure note (EGD)    - Normal esophagus.   - Pre-existing cystgastrostomy stent seen in the gastric cardia. Necrosectomy performed. Healthy viable walls of cyst cavity seen. Stent removed.   - Pre-existing Cystgastrostomy stent seen in gastric body, removed.   - Normal duodenal bulb, first portion of the duodenum and second portion of the duodenum.   - Necrosectomy was performed.     RECS:   - Advance diet as tolerated.   - Return patient to hospital ward for ongoing care.   - Resume anticoagulation tomorrow   - Return to Advanced GI office in 6 weeks     Lin Landsman, MD  Advanced Endoscopy Fellow, PGY 7

## 2021-06-24 NOTE — Nurses Notes (Signed)
Pt arrived back to room 707 from PACU. VS stable. Pt not complaining of any pain currently. Wife at bedside. Will continue to monitor.

## 2021-06-25 DIAGNOSIS — Z7901 Long term (current) use of anticoagulants: Secondary | ICD-10-CM

## 2021-06-25 DIAGNOSIS — R443 Hallucinations, unspecified: Secondary | ICD-10-CM

## 2021-06-25 DIAGNOSIS — Z9049 Acquired absence of other specified parts of digestive tract: Secondary | ICD-10-CM

## 2021-06-25 DIAGNOSIS — I2699 Other pulmonary embolism without acute cor pulmonale: Secondary | ICD-10-CM

## 2021-06-25 DIAGNOSIS — J189 Pneumonia, unspecified organism: Principal | ICD-10-CM

## 2021-06-25 DIAGNOSIS — K8591 Acute pancreatitis with uninfected necrosis, unspecified: Secondary | ICD-10-CM

## 2021-06-25 DIAGNOSIS — R251 Tremor, unspecified: Secondary | ICD-10-CM

## 2021-06-25 DIAGNOSIS — G253 Myoclonus: Secondary | ICD-10-CM

## 2021-06-25 LAB — MAGNESIUM: MAGNESIUM: 1.8 mg/dL (ref 1.8–2.6)

## 2021-06-25 LAB — CBC WITH DIFF
BASOPHIL #: <0.1 10*3/uL (ref <=0.20)
BASOPHIL %: 0 %
EOSINOPHIL #: 0.15 10*3/uL (ref <=0.50)
EOSINOPHIL %: 1 %
HCT: 24.7 % — ABNORMAL LOW (ref 38.9–52.0)
HGB: 8 g/dL — ABNORMAL LOW (ref 13.4–17.5)
IMMATURE GRANULOCYTE #: 0.14 10*3/uL — ABNORMAL HIGH (ref <0.10)
IMMATURE GRANULOCYTE %: 1 % (ref 0–1)
LYMPHOCYTE #: 5.19 10*3/uL — ABNORMAL HIGH (ref 1.00–4.80)
LYMPHOCYTE %: 29 %
MCH: 27.2 pg (ref 26.0–32.0)
MCHC: 32.4 g/dL (ref 31.0–35.5)
MCV: 84 fL (ref 78.0–100.0)
MONOCYTE #: 1.75 10*3/uL — ABNORMAL HIGH (ref 0.20–1.10)
MONOCYTE %: 10 %
NEUTROPHIL #: 10.91 10*3/uL — ABNORMAL HIGH (ref 1.50–7.70)
NEUTROPHIL %: 59 %
PLATELETS: 417 10*3/uL — ABNORMAL HIGH (ref 150–400)
RBC: 2.94 10*6/uL — ABNORMAL LOW (ref 4.50–6.10)
RDW-CV: 19.8 % — ABNORMAL HIGH (ref 11.5–15.5)
WBC: 18.2 10*3/uL — ABNORMAL HIGH (ref 3.7–11.0)

## 2021-06-25 LAB — PHOSPHORUS: PHOSPHORUS: 2.2 mg/dL — ABNORMAL LOW (ref 2.3–4.0)

## 2021-06-25 LAB — BASIC METABOLIC PANEL
ANION GAP: 7 mmol/L (ref 4–13)
BUN/CREA RATIO: 32 — ABNORMAL HIGH (ref 6–22)
BUN: 27 mg/dL — ABNORMAL HIGH (ref 8–25)
CALCIUM: 10 mg/dL (ref 8.8–10.2)
CHLORIDE: 112 mmol/L — ABNORMAL HIGH (ref 96–111)
CO2 TOTAL: 21 mmol/L — ABNORMAL LOW (ref 23–31)
CREATININE: 0.84 mg/dL (ref 0.75–1.35)
ESTIMATED GFR: >90 mL/min/BSA (ref >=60)
GLUCOSE: 140 mg/dL — ABNORMAL HIGH (ref 65–125)
POTASSIUM: 3.8 mmol/L (ref 3.5–5.1)
SODIUM: 140 mmol/L (ref 136–145)

## 2021-06-25 LAB — POC BLOOD GLUCOSE (RESULTS)
GLUCOSE, POC: 194 mg/dl — ABNORMAL HIGH (ref 70–105)
GLUCOSE, POC: 141 mg/dl — ABNORMAL HIGH (ref 70–105)
GLUCOSE, POC: 164 mg/dl — ABNORMAL HIGH (ref 70–105)
GLUCOSE, POC: 138 mg/dl — ABNORMAL HIGH (ref 70–105)

## 2021-06-25 MED ORDER — LIDOCAINE 3.6 %-MENTHOL 1.25 % TOPICAL PATCH
1.0000 | MEDICATED_PATCH | Freq: Every day | CUTANEOUS | Status: DC
Start: 2021-06-25 — End: 2021-06-27
  Administered 2021-06-25: 1 via TRANSDERMAL
  Administered 2021-06-26 – 2021-06-27 (×2): 0 via TRANSDERMAL
  Filled 2021-06-25 (×2): qty 1

## 2021-06-25 MED ORDER — ACETAMINOPHEN 325 MG TABLET
650.0000 mg | ORAL_TABLET | Freq: Four times a day (QID) | ORAL | Status: DC
Start: 2021-06-25 — End: 2021-06-27
  Administered 2021-06-25: 0 mg via ORAL
  Administered 2021-06-25: 650 mg via ORAL
  Administered 2021-06-26: 0 mg via ORAL
  Administered 2021-06-26: 650 mg via ORAL
  Administered 2021-06-26: 0 mg via ORAL
  Administered 2021-06-26 – 2021-06-27 (×3): 650 mg via ORAL
  Administered 2021-06-27: 0 mg via ORAL
  Filled 2021-06-25 (×8): qty 2

## 2021-06-25 MED ORDER — TRAZODONE 50 MG TABLET
25.0000 mg | ORAL_TABLET | ORAL | Status: AC
Start: 2021-06-26 — End: 2021-06-26
  Administered 2021-06-26: 25 mg via ORAL
  Filled 2021-06-25: qty 1

## 2021-06-25 MED ORDER — WATER FOR INJECTION, STERILE INTRAVENOUS SOLUTION
INTRAVENOUS | Status: AC
Start: 2021-06-25 — End: 2021-06-26
  Filled 2021-06-25: qty 1100

## 2021-06-25 NOTE — Progress Notes (Signed)
[facility]  Medicine Progress Note    Dov Dill  Date of service: 06/25/2021  Date of Admission:  06/21/2021    Hospital Day:  LOS: 3 days     Subjective:  Patient reports feeling about the same. Still having hallucinations, but is aware that they are not really there. Tremor is unchanged. Spoke to wife over the phone who requested further investigation of the tremors. Pt agreeable to trying clear liquids.       Medications:   Current Facility-Administered Medications   Medication Dose Route Frequency   . acetaminophen (TYLENOL) tablet  650 mg Oral Q6HRS   . adult custom variable rate parenteral nutrition   Intravenous Continuous   . adult custom variable rate parenteral nutrition   Intravenous Continuous   . amLODIPine (NORVASC) tablet  5 mg Oral Daily   . apixaban (ELIQUIS) tablet  2.5 mg Oral 2x/day   . atorvastatin (LIPITOR) tablet  40 mg Oral Daily with Breakfast   . cefepime (MAXIPIME) 2 g in NS 100 mL IVPB minibag  2 g Intravenous Q12H   . cetirizine (ZYRTEC) tablet  10 mg Oral Daily   . cholecalciferol (VITAMIN D3) 1000 unit (25 mcg) tablet  1,000 Units Oral Daily   . cyanocobalamin (VITAMIN B12) tablet  1,000 mcg Oral QAM   . D5W 250 mL flush bag   Intravenous Q15 Min PRN   . D5W 250 mL flush bag   Intravenous Q15 Min PRN   . elvitegravir-cobicistat-emtricitrabine-tenofovir (GENVOYA) 150-150-200-10 mg per tablet  1 Tablet Oral Daily   . gabapentin (NEURONTIN) capsule  300 mg Oral 2x/day   . gabapentin (NEURONTIN) capsule  600 mg Oral QPM   . hydrOXYzine pamoate (VISTARIL) capsule  50 mg Oral 4x/day PRN   . insulin glargine-yfgn 100 units/mL injection  25 Units Subcutaneous NIGHTLY   . levothyroxine (SYNTHROID) tablet  88 mcg Oral QAM   . lidocaine-menthol (LIDOPATCH) 3.6%-1.25% patch  1 Patch Transdermal Daily   . LR premix infusion   Intravenous Continuous   . melatonin tablet  9 mg Oral NIGHTLY   . mirtazapine (REMERON) tablet  7.5 mg Oral NIGHTLY   . NS 250 mL flush bag    Intravenous Q15 Min PRN   . NS 250 mL flush bag   Intravenous Q15 Min PRN   . NS flush syringe  2-6 mL Intracatheter Q8HRS   . NS flush syringe  2-6 mL Intracatheter Q1 MIN PRN   . NS flush syringe  2-6 mL Intracatheter Q8HRS   . NS flush syringe  2-6 mL Intracatheter Q1 MIN PRN   . oxyCODONE (ROXICODONE) immediate release tablet  5 mg Oral Q4H PRN   . pantoprazole (PROTONIX) delayed release tablet  40 mg Oral 2x/day   . promethazine (PHENERGAN) tablet  6.25 mg Oral Q6H PRN   . sennosides-docusate sodium (SENOKOT-S) 8.6-'50mg'$  per tablet  1 Tablet Oral 2x/day   . SSIP insulin lispro 100 units/mL injection  0-12 Units Subcutaneous 4x/day PRN         Vital Signs:  Temp  Avg: 36.8 C (98.3 F)  Min: 36.1 C (97 F)  Max: 37.4 C (99.3 F)    Pulse  Avg: 97.4  Min: 87  Max: 105 BP  Min: 106/57  Max: 132/85   Resp  Avg: 16  Min: 12  Max: 18 SpO2  Avg: 95 %  Min: 91 %  Max: 99 %  Physical Exam:  General Appearance: Restless chronically ill-appearing, and vital signs reviewed  Eyes: Conjunctiva clear, pupils equal and round, sclera non-icteric,  Neck: Supple, trachea midline  Lungs: Clear to auscultation bilaterally, no wheezes or crackles, breathing comfortably  Heart: S1S2, RRR, no murmur     Abdomen: Soft, non-tender, non-distended, normoactive bowel sounds  Extremities/MSK: No peripheral edema, moves all 4 extremities  Integumentary:  Skin warm and dry, no rashes or visible lesions  Neurologic: Grossly normal, alert, no focal neurologic deficits, tremor noted.   Psychiatric: Mood/affect appropriate, speech normal,  + hallucinations      Labs:  I reviewed labs    Radiology:  I reviewed imaging       PT/OT: Yes    Consults: GI, Surgery Oncology, Wound Care, Psychiatry, Rheumatology, Orthopedics        Assessment/ Plan:   Active Hospital Problems    Diagnosis   . Primary Problem: Pneumonia     Alastair Hennes is a 76 y.o. male with 76 y.o.malewith h/oHIV infection, hypertension, hyperlipidemia. 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  Would need IR BX of vertebrae.  Patient and family would like to hold off for now.  Follow up in 2 weeks with repeat XR T-spine AP/lat  - Metabolic bone consulted   - Consider Bone Scan as this could be a pathologic fracture from bone metastases   - Vitamin D level 42.  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     Insomnia  Anxiety  - Psychiatry consulted   - Continue melatonin 9 mg QHS   - Continue gabapentin 600 mg QHS   - Continue Remeron 7.5 mg QHS    Tremors  - neuro consulted.     Adult failure to thrive  severe protein calorie malnutrition  - Continue TPN  - Nutrition consult and MNT protocol  - advance diet as tolerated.     HTN  HLD  - Continue amlodipine 5 mg oral daily  - Continue Lipitor 40 mg oral daily    Insulin-dependent DMII with peripheral neuropathy  - Last HbA1c was 9.3 on 03/17/2021  - Continue Lantus 25 units nightly and SSI  - Continue home neurontin  - Diabetic diet  - Nutrition consult and MNT protocol ordered    History of stroke and Pulmonary embolism  - Continue Eliquis 2.5 mg oral BID    Mild protein  calorie malnutrition on TPN  - Albumin 3.2  - Nutrition consult and MNT protocol  - Continue vitamin B12    Hypothyroidism  - Continue Synthroid 88 mcg daily       DVT/PE Prophylaxis: Apixaban    Disposition Planning: Home discharge        Layne Benton,  MD 06/25/2021 15:28  [facility]  Hospitalist-[facility]  Pager  # [phone].

## 2021-06-25 NOTE — Consults (Signed)
Gastroenterology and Hepatology  Consult Follow Up Note    [name], [name], 76 y.o. male  Date of Admission:  06/21/2021  Date of service: 06/25/2021  Date of Birth:  02-Nov-1945    Assessment/Plan:  [name] is a 76 y.o. male with hx of HIV infection, hypertension, hyperlipidemia. 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 We were consulted to evaluate patient's walled off necrosis.     Problem List:  1. Hx of necrotizing pancreatitis s/p LAMS placement and necrosectomy   2. Fluid collection is splenectomy site  3. Peripancreatic fluid collections s/p RUQ drain and empiric embolization  4. IPMN s/pdistal pancreatectomy and splenectomy2/21/2023  5. Hx of Pseudomonas bacteremia  6. 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 HIV infection on treatment    Recommendations:  - Patient is s/p necrosectomy 05/25, report he is doing well overall, but still have mild left lower quadrant abdominal pain.   - He report he is tolerating diet, continue to advance as tolerated   - Patient will need Ct imaging in 3-4 weeks and advance GI clinic  Follow upon discharge   - Please call or page GI fellow on call with questions.     Subjective:   Patient seen laying in bed, report mild left lower abdomen pain. Patient report he is tolerating diet. All questions answered.     Objective:  Physical Exam  Filed Vitals:    06/25/21 0729 06/25/21 0754 06/25/21 1201 06/25/21 1451   BP: 130/70  124/66    Pulse: (!) 105  (!) 102    Resp: '16 18 16 16   '$ Temp: 36.7 C (98.1 F)  37.1 C (98.8 F)    SpO2: 96%  96%      General: No apparent distress, laying in bed  Mouth: Mucous membranes moist  Neck:  Supple,  trachea midline, no JVD  Lungs:  CTAB  Heart:  Tachycardia  Abdomen: Soft, non-tender to palpation + BS  Extremities: No cyanosis, no edema  Neuro:  Grossly normal, able to answer simple questions with meaningful answers  Heme:  Pale  Skin:  Warm and dry    Labs: I have reviewed all pertinent lab values. Elevated  WBC 18.2 (06/25/21).    Imaging: I have personally reviewed and interpreted images from CT CAP (06/21/21), which shows cystgastrostomy stent in-situ.      Fanny Skates, MD  PGY-4 Gastroenterology and Hepatology Fellow      I saw and examined the patient.  I reviewed the fellow's note.  I agree with the findings and plan of care as documented in the fellow's note.  Any exceptions/additions are edited/noted.    Murrell Converse, MD  Assistant Professor, Gastroenterology - Advanced Endoscopy  [facility], [place]

## 2021-06-25 NOTE — Consults (Signed)
[facility]  Surgery Oncology   Consult Follow Up Note      [name], [name], 76 y.o. male  Date of Admission:  06/21/2021  Date of service: 06/25/2021  Date of Birth:  1945-04-13    PCP: [place] Requested By: [name] Hummer, DO    Assessment:  [name] is a 76 y.o. 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 The patient was diagnosed with PE and started on Eliquis. He presented to outside hospital with abdominal pain and was found to have active extravasation at the surgical site on CT imaging. He underwent mass transfusion protocol and ex lap on 04/03/21 with ligation of bleeding vessel at the pancreatectomy bed. He was transferred to [facility] and postoperatively developed a midline fistula. Patient was admitted due to pneumonia and a T8 compression fracture found on CT imaging.     Subjective:  Pt is now post-procedure day 1 from an EGD and repeat necrosectomy with removal of stent per GI. The pt this morning is c/o back pain and inability to get quality sleep overnight. He reports some "mild stomach pain" after his procedure yesterday but reports no acute concerns.       PHYSICAL EXAM:  Temperature: 36.8 C (98.2 F)  Heart Rate: (!) 102  BP (Non-Invasive): 123/62  Respiratory Rate: 16  SpO2: 94 %    General: NAD. Laying in bed. Appears uncomfortable.  HEENT: NC/AT  CV: Pink and well-perfused  Pulm: Equal and symmetric chest rise. Normal work of breathing. No distress. On RA.  Abdominal: Soft, minimally tender. No overt peritoneal signs.   Extrem: Warm and well perfused. No jaundice appreciated  Skin: Pink, warm, dry, and clean.  Neuro: Grossly normal. Answers questions appropriately and participates in conversation. No gross deficit. Responsive to external stimuli.   Psych: Normal mood and affect. Calm and cooperative.      DIAGNOSTIC DATA:  Reviewed: Labs:    Lab Results Today:    Results for  orders placed or performed during the hospital encounter of 06/21/21 (from the past 24 hour(s))   BASIC METABOLIC PANEL   Result Value Ref Range    SODIUM 142 136 - 145 mmol/L    POTASSIUM 4.0 3.5 - 5.1 mmol/L    CHLORIDE 110 96 - 111 mmol/L    CO2 TOTAL 23 23 - 31 mmol/L    ANION GAP 9 4 - 13 mmol/L    CALCIUM 10.0 8.8 - 10.2 mg/dL    GLUCOSE 124 65 - 125 mg/dL    BUN 26 (H) 8 - 25 mg/dL    CREATININE 1.11 0.75 - 1.35 mg/dL    BUN/CREA RATIO 23 (H) 6 - 22    ESTIMATED GFR 69 >=60 mL/min/BSA   C-REACTIVE PROTEIN(CRP),INFLAMMATION   Result Value Ref Range    CRP INFLAMMATION 22.4 (H) <8.0 mg/L   HEPATIC FUNCTION PANEL   Result Value Ref Range    ALBUMIN 3.0 (L) 3.4 - 4.8 g/dL     ALKALINE PHOSPHATASE 82 45 - 115 U/L    ALT (SGPT) 15 10 - 55 U/L    AST (SGOT)  24 8 - 45 U/L    BILIRUBIN TOTAL 0.3 0.3 - 1.3 mg/dL    BILIRUBIN DIRECT 0.1 0.1 - 0.4 mg/dL    PROTEIN TOTAL 7.2 6.0 - 8.0 g/dL   MAGNESIUM   Result Value Ref Range    MAGNESIUM 1.9 1.8 -  2.6 mg/dL   PHOSPHORUS   Result Value Ref Range    PHOSPHORUS 3.2 2.3 - 4.0 mg/dL   POC BLOOD GLUCOSE (RESULTS)   Result Value Ref Range    GLUCOSE, POC 149 (H) 70 - 105 mg/dl   CBC WITH DIFF   Result Value Ref Range    WBC 16.1 (H) 3.7 - 11.0 x10^3/uL    RBC 3.11 (L) 4.50 - 6.10 x10^6/uL    HGB 8.3 (L) 13.4 - 17.5 g/dL    HCT 25.5 (L) 38.9 - 52.0 %    MCV 82.0 78.0 - 100.0 fL    MCH 26.7 26.0 - 32.0 pg    MCHC 32.5 31.0 - 35.5 g/dL    RDW-CV 19.5 (H) 11.5 - 15.5 %    PLATELETS 476 (H) 150 - 400 x10^3/uL    MPV 12.7 (H) 8.7 - 12.5 fL   MANUAL DIFF AND MORPHOLOGY-SYSMEX   Result Value Ref Range    NEUTROPHIL % 56 %    LYMPHOCYTE %  38 %    MONOCYTE % 4 %    EOSINOPHIL % 1 %    BASOPHIL % 1 %    NEUTROPHIL # 9.02 (H) 1.50 - 7.70 x10^3/uL    LYMPHOCYTE # 6.12 (H) 1.00 - 4.80 x10^3/uL    MONOCYTE # 0.64 0.20 - 1.10 x10^3/uL    EOSINOPHIL # 0.16 <=0.50 x10^3/uL    BASOPHIL # 0.16 <=0.20 x10^3/uL    ECHINOCYTE (BURR CELL) 2+/Moderate (A) None    SPHEROCYTES 2+/Moderate (A) None    POC BLOOD GLUCOSE (RESULTS)   Result Value Ref Range    GLUCOSE, POC 104 70 - 105 mg/dl   POC BLOOD GLUCOSE (RESULTS)   Result Value Ref Range    GLUCOSE, POC 104 70 - 105 mg/dl   POC BLOOD GLUCOSE (RESULTS)   Result Value Ref Range    GLUCOSE, POC 107 (H) 70 - 105 mg/dl     Imaging Studies:  CT a/p left fluid collection stable    Impression/Recommendation:   76 y.o. male with compression fracture and pneumonia. Now s/p necrosectomy with GI 5/25.    Would not use chemical or mechanical debridement on inferior midline wound given close proximity to bowel.   Labs pending today  Rest per primary  Page surgical oncology with questions      [name] Christen, MD  General Surgery // PGY-1  [facility]      I saw and examined the patient.  I reviewed the resident's note.  I agree with the findings and plan of care as documented in the resident's note.  Any exceptions/additions are edited/noted.    Feeling better after necrosectomy yesterday. Reports feeling anxious, fidgety. Nurse reports he is having hallucinations.  Recommended consideration for psych consult.      [name] Edelman A. Cyndi Bender, MD, FACS

## 2021-06-25 NOTE — Care Management Notes (Signed)
[facility]  Care Management Note    Patient Name: [name]  Date of Birth: 31-Jul-1945  Sex: male  Date/Time of Admission: 06/21/2021  9:26 PM  Room/Bed: 707/A  Payor: [place] / Plan: Jolayne Panther VACCN/OPTUM / Product Type: Managed Care /    LOS: 3 days   Primary Care Providers:  [place] (General)    Admitting Diagnosis:  Pneumonia [J18.9]    Assessment:      06/25/21 1416   Assessment Details   Assessment Type Continued Assessment   Date of Care Management Update 06/25/21   Date of Next DCP Update 06/28/21   Care Management Plan   Discharge Planning Status plan in progress   Projected Discharge Date 06/27/21   Discharge Needs Assessment   Discharge Facility/Level of Care Needs Home with Home Health (code 6)     Pt was admitted for Pneumonia. Continue TPN. Labs pending. PT/Ot recommending home with home health. Referral was previously sent to [facility] Will continue to follow.         Discharge Plan:  Home with Home Health (code 6)      The patient will continue to be evaluated for developing discharge needs.     Case Manager: Arbie Cookey, [place]  Phone: [phone]

## 2021-06-25 NOTE — Nurses Notes (Signed)
Pt c/o severe L sided flank pain.  States it feels like his last kidney stone.  Prn oxy administered at this time.  Service notified.

## 2021-06-25 NOTE — Nurses Notes (Signed)
RN paged service regarding patient's continued confusion at times and forgetfulness. Pt admits to seeing things that are not there. Service notified and Neuro consult pending per Resident. RN will continue to monitor.

## 2021-06-25 NOTE — Anesthesia Postprocedure Evaluation (Signed)
Anesthesia Post Op Evaluation    Patient: [name]  Procedure(s) with comments:  NECROSECTOMY - EGD scope    Last Vitals:Temperature: 36.8 C (98.2 F) (06/25/21 0435)  Heart Rate: (!) 102 (06/25/21 0435)  BP (Non-Invasive): 123/62 (06/25/21 0435)  Respiratory Rate: 16 (06/25/21 0435)  SpO2: 94 % (06/25/21 0435)    No notable events documented.      Patient location during evaluation: PACU       Level of consciousness: awake    Airway patency: patent    Anesthetic complications: no  Cardiovascular status: acceptable and tachycardic  Respiratory status: acceptable  Patient post-procedure temperature: Pt Normothermic

## 2021-06-25 NOTE — Care Plan (Signed)
Medical Nutrition Therapy  Nutrition Support Update       06/25/2021       Current Estimated Needs:   Energy Calorie Requirements: 2190-2555 kcal/day (30-35 kcal/kg)   Protein Requirements (gms/day): 90-110 g/day (1.2-1.5 g/kg)   Fluid Requirements: 2190-2555 mL/day (1 mL/kcal)     Reason for Adjustment: Advance TPN to goal once Phosphorus is corrected. Phosphorus dropped from 3.2-2.2.    New Recommendations:  TPN goal rate:  2400 cal /day = 33 cal/kg  110 g protein = 1.52 g/ kg  1060 dextrose cals = 5.96 GIR   900 IL cals or 1.38 g fat/kg    -Monitor potassium, magnesium and phosphorus (2.2) daily along with blood sugars and BMP      -Monitor LFTs and triglycerides weekly.   -Monitor weekly weights.   -Will continue to follow.     Rolena Infante, RDLD  06/25/2021, 10:47  Pager # [phone]

## 2021-06-25 NOTE — Consults (Signed)
[facility]  Neurology Initial Consult    [name], [name], 76 y.o. 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    Neurology was consulted for tremors and hallucinations on 06/25/21.  Patient's wife reports that he has been having tremors in both hands for the past week. Initially he was discharged to [facility] and for the past week, he has been home and walking with a cane at home. Tremors present all the time, throughout the day. She would see his body twitching even during the night.   Denies trouble swallowing, hallucinations, RBD symptoms, falls, dropping food.    Wife reports that he was doing fine until Feb 2023 when he underwent the surgery for pancreatectomy and since then has gone downhill and has been needing a hospital bed.      Admission Source:  Home  Advance Directives:  [place] involvement prior to admission?  Not applicable    Location (of pain): Quality (character of pain) Severity (minimal, mild, severe, scale or 1-10) Duration (how long has pain/sx present) Timing (when does pain/sx occur)  Context (activity at/before onset) Modifying Factors (what makes pain/sx  Better/worse) Associate Sign/Sx (what accompanies main  pain/sx)    Past Medical History:   Diagnosis Date   . Cancer (CMS [facility])     prostate   . Deep vein thrombosis (DVT) (CMS HCC)     right leg   . Esophageal reflux    . H/O hearing loss    . High cholesterol    . 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 HTN (hypertension)    . 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 Hyperlipidemia     "borderline"   . Hypothyroidism    . MRSA (methicillin resistant staph aureus) culture positive 01/02/2021    MRSA left groin abscess 01/03/21   . MRSA (methicillin resistant staph aureus) culture positive 01/02/2021    MRSA blood 01/02/21   . Pulmonary embolism (CMS HCC) 03/31/2021   . Thyroid disorder    . Wears glasses          Past Surgical History:   Procedure Laterality Date   . COLON SURGERY      per patient had part of colon removed and had a colostomy   . COLONOSCOPY     . GASTROSCOPY     . 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 Parke Simmers   . [place]  2001   . HX COLOSTOMY REVERSAL     . HX HERNIA REPAIR     . KNEE SURGERY Bilateral    . WRIST SURGERY Right  Medications Prior to Admission     Prescriptions    acetaminophen (TYLENOL) 325 mg Oral Tablet    Take 2 Tablets (650 mg total) by mouth Every 6 hours as needed for Pain    amLODIPine (NORVASC) 10 mg Oral Tablet    Take 1 Tablet (10 mg total) by mouth Once a day    apixaban (ELIQUIS) 2.5 mg Oral Tablet    Take 1 Tablet (2.5 mg total) by mouth Twice daily for 90 days    Patient taking differently:  Take 1 Tablet (2.5 mg total) by mouth Twice daily Reduced dose is due to drug interaction with Genvoya.    atorvastatin (LIPITOR) 80 mg Oral Tablet    Take 0.5 Tablets (40 mg total) by mouth Every morning with breakfast    Blood Sugar Diagnostic (ACCU-CHEK GUIDE TEST STRIPS) Strip    1 Strip Four times a day - before meals and bedtime    cyanocobalamin (VITAMIN B 12) 1,000 mcg Oral Tablet     Take 1 Tablet (1,000 mcg total) by mouth Every morning    elviteg-cob-emtri-tenof ALAFEN (GENVOYA) 150-150-200-10 mg Oral Tablet    Take 1 Tablet by mouth Once a day    flash glucose scanning reader (FREESTYLE LIBRE 2 READER) Does not apply Misc    Use as directed with FreeStyle Libre 2 sensors    flash glucose sensor (FREESTYLE LIBRE 2 SENSOR) Does not apply Kit    To check blood glucose continuously. Change every 14 days    flash glucose sensor (FREESTYLE LIBRE 2 SENSOR) Does not apply Kit    To check blood glucose continuously. Change every 14 days    gabapentin (NEURONTIN) 100 mg Oral Capsule    Take 1 Capsule (100 mg total) by mouth Three times a day    hydrOXYzine pamoate (VISTARIL) 25 mg Oral Capsule    Take 1 Capsule (25 mg total) by mouth Four times a day as needed for Itching    insulin lispro 100 units/mL Subcutaneous Injectable    Inject 0-12 Units under the skin Four times a day as needed for Other    insulin NPH isoph U-100 human 100 unit/mL Subcutaneous Suspension    Inject 15 Units under the skin Every morning before breakfast    Patient taking differently:  Inject 15 Units under the skin Every morning before breakfast and 25 units before dinner    insulin regular human (HUMULIN R) 100 unit/mL Injection Solution    Inject 12 Units under the skin Three times a day    Patient taking differently:  Inject 5 Units under the skin Three times a day    lancets (FREESTYLE LANCETS) 28 gauge Misc    Use to test blood sugar 4 times a day - before meals and bedtime    levothyroxine (SYNTHROID) 88 mcg Oral Tablet    Take 1 Tablet (88 mcg total) by mouth Every morning    melatonin 3 mg Oral Tablet    Take 2 Tablets (6 mg total) by mouth Every night    Mirtazapine (REMERON) 7.5 mg Oral Tablet    Take 1 Tablet (7.5 mg total) by mouth Every night    MULTIVITAMIN ORAL    Take 1 Tablet by mouth Once a day    oxyCODONE (ROXICODONE) 5 mg Oral Tablet    Take 1 Tablet (5 mg total) by mouth Every 4 hours as needed for  Pain    pantoprazole (PROTONIX) 40 mg Oral Tablet, Delayed Release (E.C.)  Take 1 Tablet (40 mg total) by mouth Twice daily 30 minutes before a meal    polyethylene glycol (MIRALAX) 17 gram/dose Oral Powder    Take 3 teaspoons (17 g total) by mouth Once a day    prochlorperazine (COMPAZINE) 10 mg/2 mL (5 mg/mL) Injection Solution    Infuse 2 mL (10 mg total) into a venous catheter Every 6 hours as needed    scopolamine (TRANSDERM SCOP) 1 mg over 3 days Transdermal patch 3 day    Place 1 Patch on the skin Every 72 hours    sennosides-docusate sodium (SENOKOT-S) 8.6-50 mg Oral Tablet    Take 1 Tablet by mouth Twice daily        No Known Allergies  Social History     Tobacco Use   . Smoking status: Never   . Smokeless tobacco: Never   Substance Use Topics   . Alcohol use: Not Currently     Family Medical History:     Problem Relation (Age of Onset)    Cancer Mother, Father, Other    Heart Attack Mother    High Cholesterol Other    Stroke Other            ROS: Other than ROS in the HPI, all other systems were negative.    Exam:  Temperature: 37.1 C (98.8 F)  Heart Rate: (!) 102  BP (Non-Invasive): 124/66  Respiratory Rate: 16  SpO2: 96 %  General: appears chronically ill  XFG:HWEX without erythema or injection, mucous membranes moist.  Musculoskeletal: Head atraumatic and normocephalic  Ophthalomscopic: red reflex present  [place]:  Eye opening:  4 spontaneous, Verbal response:  5 oriented, Best motor response:  6 obeys commands  Mental status:  Level of Consciousness: alert  Orientations: Alert and oriented x 3  MemoryRegistration, Recall, and Following of commands is normal  AttentionsAttention and Concentration are normal  Knowledge: Good  Language: Normal  Speech: Normal  Cranial nerves:   CN2: Visual fields intact  CN 3,4,6: EOMI, PERRL  CN 5Facial sensation intact  CN 7Face symmetrical  CN 8: Hearing grossly intact  CN 9,10: Palate symmetric  CN 11: Sternocleidomastoid and Trapezius have normal strength.  CN  12: Tongue normal with no fasiculations or deviation  Gait, Coordination, and Reflexes:   Gait: unable to assess. Reason: fall risk  Coordination: Coordination is normal without tremor; no decrement seen on finger taps    Muscle tone: WNL; no rigidity noted  Muscle exam  Arm Right Left Leg Right Left   Deltoid 5/5 5/5 Iliopsoas 5/5 5/5   Biceps 5/5 5/5 Quads 5/5 5/5   Triceps 5/5 5/5 Hamstrings 5/5 5/5   Wrist Extension 5/5 5/5 Ankle Dorsi Flexion 5/5 5/5   Wrist Flexion 5/5 5/5 Ankle Plantar Flexion 5/5 5/5   Interossei 5/5 5/5 Ankle Eversion 5/5 5/5   APB 5/5 5/5 Ankle Inversion 5/5 5/5       Reflexes   RJ BJ TJ KJ AJ Plantars Hoffman's   Right 2+ 2+ 2+ 2+ 2+ Downgoing Not present   Left 2+ 2+ 2+ 2+ 2+ Downgoing Not present     Sensory: Sensory exam in the upper and lower extremities is normal  Integumentary: Skin warm and dry  Diabetes Monitors:    Results in Last 18 Months   Lab Test 03/24/20  1051 03/25/20  0443 03/17/21  0917 04/03/21  1151 05/25/21  0552 05/26/21  0320 06/21/21  1709   HA1C  --   --  9.3*  --   --   --   --    GLUCOSEFAST  --   --  165*  --   --   --   --    CHOLESTEROL  --    < >  --   --  108  --   --    HDLCHOL  --    < >  --   --  25*  --   --    LDLCHOL  --    < >  --   --  45  --   --    LDLCHOLDIR 89  --   --   --   --   --   --    TRIG  --    < >  --    < > 240*   < > 183*    < > = values in this interval not displayed.       Labs:    I have reviewed all lab results.    Cr. 0.84  TSH 1.246  AST 24  ALT 15  HbA1c 9.3    Review of reports and notes reveal:     Independent Interpretation of images or specimens:  CTA intra and extra wwo contrast on 06/21/21: no LVO, high grade stenosis    MRI brain wwo contrast on 05/25/21: Cerebral atrophy noted, enlarged sylvian fissures    Assessment/Plan:  Active Hospital Problems    Diagnosis   . 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 Neurology was consulted for tremors and hallucinations on 06/25/21. Per wife, tremors in both hands for the past week, she has seen him twitching at night. Neurological exam shows myoclonic jerks otherwise no signs of Parkinson's disease. CTA extra intra wwo contrast on 06/21/21 did not show LVO, high grade stenosis. MRI brain wwo contrast on 05/27/21 shows significant cerebral and cerebellar atrophy. Etiology of myoclonic jerks is possibly secondary to toxic metabolic causes.    # Myoclonic jerks  Etiology: Possibly secondary to toxic/ metabolic causes, Cefepime induced  - Recommend routine EEG to evaluate seizure potential  - Recommend ammonia level  - Recommend choosing alternative antibiotic to Cefepine  - Neuro checks    Thank you for allowing [place] to participate in patient's care, please call or page [place] with questions.      DNR Status this admission:  Full Code  Palliative/Supportive Care consulted?  no  Hospice Consulted?  Not applicable    Current Comorbid Conditions - Neurology Consult  Compression of the Brain:  No other  Coma (GCS less than 8):Coma -Not applicable  TIA not applicable  Encephalopathy:  no  Encephalitis-Not applicable  Seizure-Not applicable   None  No  Coagulopathy Not applicable  N/A    DVT/PE Prophylaxis: per primary    Harvie Bridge, MD  PGY-3 Neurology  Pager [phone]

## 2021-06-26 DIAGNOSIS — F419 Anxiety disorder, unspecified: Secondary | ICD-10-CM

## 2021-06-26 DIAGNOSIS — K8592 Acute pancreatitis with infected necrosis, unspecified: Secondary | ICD-10-CM

## 2021-06-26 DIAGNOSIS — E43 Unspecified severe protein-calorie malnutrition: Secondary | ICD-10-CM

## 2021-06-26 DIAGNOSIS — G4700 Insomnia, unspecified: Secondary | ICD-10-CM

## 2021-06-26 DIAGNOSIS — R627 Adult failure to thrive: Secondary | ICD-10-CM

## 2021-06-26 DIAGNOSIS — Z794 Long term (current) use of insulin: Secondary | ICD-10-CM

## 2021-06-26 LAB — POC BLOOD GLUCOSE (RESULTS)
GLUCOSE, POC: 212 mg/dl — ABNORMAL HIGH (ref 70–105)
GLUCOSE, POC: 99 mg/dl (ref 70–105)
GLUCOSE, POC: 150 mg/dl — ABNORMAL HIGH (ref 70–105)
GLUCOSE, POC: 122 mg/dl — ABNORMAL HIGH (ref 70–105)

## 2021-06-26 LAB — MANUAL DIFF AND MORPHOLOGY-SYSMEX
BASOPHIL #: <0.1 10*3/uL (ref <=0.20)
BASOPHIL %: 0 %
EOSINOPHIL #: 0.17 10*3/uL (ref <=0.50)
EOSINOPHIL %: 1 %
LYMPHOCYTE #: 6.77 10*3/uL — ABNORMAL HIGH (ref 1.00–4.80)
LYMPHOCYTE %: 41 %
MONOCYTE #: 0.83 10*3/uL (ref 0.20–1.10)
MONOCYTE %: 5 %
NEUTROPHIL #: 8.75 10*3/uL — ABNORMAL HIGH (ref 1.50–7.70)
NEUTROPHIL %: 53 %
NRBC FROM MANUAL DIFF: 1 per 100 WBC

## 2021-06-26 LAB — CBC WITH DIFF
HCT: 24.3 % — ABNORMAL LOW (ref 38.9–52.0)
HGB: 7.9 g/dL — ABNORMAL LOW (ref 13.4–17.5)
MCH: 27.1 pg (ref 26.0–32.0)
MCHC: 32.5 g/dL (ref 31.0–35.5)
MCV: 83.2 fL (ref 78.0–100.0)
MPV: 12.5 fL (ref 8.7–12.5)
PLATELETS: 395 10*3/uL (ref 150–400)
RBC: 2.92 10*6/uL — ABNORMAL LOW (ref 4.50–6.10)
RDW-CV: 19.7 % — ABNORMAL HIGH (ref 11.5–15.5)
WBC: 16.5 10*3/uL — ABNORMAL HIGH (ref 3.7–11.0)

## 2021-06-26 LAB — BASIC METABOLIC PANEL
ANION GAP: 7 mmol/L (ref 4–13)
BUN/CREA RATIO: 29 — ABNORMAL HIGH (ref 6–22)
BUN: 24 mg/dL (ref 8–25)
CALCIUM: 9.9 mg/dL (ref 8.8–10.2)
CHLORIDE: 111 mmol/L (ref 96–111)
CO2 TOTAL: 22 mmol/L — ABNORMAL LOW (ref 23–31)
CREATININE: 0.84 mg/dL (ref 0.75–1.35)
ESTIMATED GFR: >90 mL/min/BSA (ref >=60)
GLUCOSE: 210 mg/dL — ABNORMAL HIGH (ref 65–125)
POTASSIUM: 4.1 mmol/L (ref 3.5–5.1)
SODIUM: 140 mmol/L (ref 136–145)

## 2021-06-26 LAB — PHOSPHORUS: PHOSPHORUS: 3.2 mg/dL (ref 2.3–4.0)

## 2021-06-26 LAB — ADULT ROUTINE BLOOD CULTURE, SET OF 2 BOTTLES (BACTERIA AND YEAST): BLOOD CULTURE, ROUTINE: NO GROWTH

## 2021-06-26 LAB — MAGNESIUM: MAGNESIUM: 1.8 mg/dL (ref 1.8–2.6)

## 2021-06-26 MED ORDER — METOCLOPRAMIDE 5 MG/ML INJECTION SOLUTION
10.0000 mg | Freq: Four times a day (QID) | INTRAMUSCULAR | Status: DC | PRN
Start: 2021-06-26 — End: 2021-06-27
  Administered 2021-06-26: 10 mg via INTRAVENOUS
  Filled 2021-06-26: qty 2

## 2021-06-26 NOTE — Care Plan (Signed)
Problem: Adult Inpatient Plan of Care  Goal: Plan of Care Review  Outcome: Ongoing (see interventions/notes)  Goal: Patient-Specific Goal (Individualized)  Outcome: Ongoing (see interventions/notes)  Goal: Absence of Hospital-Acquired Illness or Injury  Outcome: Ongoing (see interventions/notes)  Intervention: Prevent Skin Injury  Recent Flowsheet Documentation  Taken 06/26/2021 1748 by Diona Foley, RN  Body Position: supine, head elevated  Taken 06/26/2021 1400 by Diona Foley, RN  Body Position: positioned/repositioned independently  Taken 06/26/2021 1200 by Diona Foley, RN  Body Position: positioned/repositioned independently  Taken 06/26/2021 1000 by Diona Foley, RN  Body Position:   positioned/repositioned independently   side lying, right  Taken 06/26/2021 0800 by Diona Foley, RN  Body Position: side lying, left  Skin Protection: adhesive use limited  Goal: Optimal Comfort and Wellbeing  Outcome: Ongoing (see interventions/notes)  Goal: Rounds/Family Conference  Outcome: Ongoing (see interventions/notes)     Problem: Fall Injury Risk  Goal: Absence of Fall and Fall-Related Injury  Outcome: Ongoing (see interventions/notes)     Problem: Fall Injury Risk  Goal: Absence of Fall and Fall-Related Injury  Outcome: Ongoing (see interventions/notes)     Problem: Skin Injury Risk Increased  Goal: Skin Health and Integrity  Outcome: Ongoing (see interventions/notes)  Intervention: Optimize Skin Protection  Recent Flowsheet Documentation  Taken 06/26/2021 0800 by Diona Foley, RN  Skin Protection: adhesive use limited  Activity Management: activity adjusted per tolerance    Continuing IV abx therapy. Patient complained of pain and was medicated with oxy ir and scheduled tylenol. Advanced to regular diet and tolerating well. He denied nausea or vomiting. Vistaril for anxiety. Up with one assist and free from falls.

## 2021-06-26 NOTE — Progress Notes (Signed)
[facility]  Medicine Progress Note    [name]  Date of service: 06/26/2021  Date of Admission:  06/21/2021    Hospital Day:  LOS: 4 days   Subjective: Had anxiety and poor sleep overnight.  Feeling better this morning.    Vital Signs:  Temp  Avg: 36.8 C (98.2 F)  Min: 36.5 C (97.7 F)  Max: 37.1 C (98.8 F)    Pulse  Avg: 101.7  Min: 100  Max: 104 BP  Min: 115/68  Max: 135/69   Resp  Avg: 16.4  Min: 16  Max: 17 SpO2  Avg: 95.8 %  Min: 94 %  Max: 97 %          Input/Output    Intake/Output Summary (Last 24 hours) at 06/26/2021 1538  Last data filed at 06/26/2021 1344  Gross per 24 hour   Intake 563.93 ml   Output 2950 ml   Net -2386.07 ml    I/O last shift:  05/27 0700 - 05/27 1859  In: 120 [P.O.:120]  Out: 800 [Urine:800]   acetaminophen (TYLENOL) tablet, 650 mg, Oral, Q6HRS  adult custom variable rate parenteral nutrition, , Intravenous, Continuous  amLODIPine (NORVASC) tablet, 5 mg, Oral, Daily  apixaban (ELIQUIS) tablet, 2.5 mg, Oral, 2x/day  atorvastatin (LIPITOR) tablet, 40 mg, Oral, Daily with Breakfast  cefepime (MAXIPIME) 2 g in NS 100 mL IVPB minibag, 2 g, Intravenous, Q12H  cetirizine (ZYRTEC) tablet, 10 mg, Oral, Daily  cholecalciferol (VITAMIN D3) 1000 unit (25 mcg) tablet, 1,000 Units, Oral, Daily  cyanocobalamin (VITAMIN B12) tablet, 1,000 mcg, Oral, QAM  D5W 250 mL flush bag, , Intravenous, Q15 Min PRN  D5W 250 mL flush bag, , Intravenous, Q15 Min PRN  elvitegravir-cobicistat-emtricitrabine-tenofovir (GENVOYA) 150-150-200-10 mg per tablet, 1 Tablet, Oral, Daily  gabapentin (NEURONTIN) capsule, 300 mg, Oral, 2x/day  gabapentin (NEURONTIN) capsule, 600 mg, Oral, QPM  hydrOXYzine pamoate (VISTARIL) capsule, 50 mg, Oral, 4x/day PRN  insulin glargine-yfgn 100 units/mL injection, 25 Units, Subcutaneous, NIGHTLY  levothyroxine (SYNTHROID) tablet, 88 mcg, Oral, QAM  lidocaine-menthol (LIDOPATCH) 3.6%-1.25% patch, 1 Patch, Transdermal, Daily  LR premix infusion, , Intravenous,  Continuous  melatonin tablet, 9 mg, Oral, NIGHTLY  metoclopramide (REGLAN) 5 mg/mL injection, 10 mg, Intravenous, Q6H PRN  mirtazapine (REMERON) tablet, 7.5 mg, Oral, NIGHTLY  NS 250 mL flush bag, , Intravenous, Q15 Min PRN  NS 250 mL flush bag, , Intravenous, Q15 Min PRN  NS flush syringe, 2-6 mL, Intracatheter, Q8HRS  NS flush syringe, 2-6 mL, Intracatheter, Q1 MIN PRN  NS flush syringe, 2-6 mL, Intracatheter, Q8HRS  NS flush syringe, 2-6 mL, Intracatheter, Q1 MIN PRN  oxyCODONE (ROXICODONE) immediate release tablet, 5 mg, Oral, Q4H PRN  pantoprazole (PROTONIX) delayed release tablet, 40 mg, Oral, 2x/day  sennosides-docusate sodium (SENOKOT-S) 8.6-'50mg'$  per tablet, 1 Tablet, Oral, 2x/day  SSIP insulin lispro 100 units/mL injection, 0-12 Units, Subcutaneous, 4x/day PRN        Physical Exam:  Gen:  Alert, fully awake.  On bed in NAD.  HEENT: MMM.  NCAT.  No scleral icterus.  CV: RRR.  No LE edema.  Pulm: CTAB.  Breathing is non-labored.  No accessory muscle use.  GI: Midline surgical site well-healed.  Skin: No rash  Neuro: Mild tremor noted.  MSK: Upper and lower extremities are without gross deformities.  Psych: Appropriate affect.    Labs, Data, and Studies:  Reviewed the most recent lab data.  Reviewed the most recent radiological studies.  Reviewed the most recent  medical studies.  Reviewed the latest consultant notes.    Assessment/ Plan:   Active Hospital Problems    Diagnosis   . Primary Problem: Pneumonia     [name] Smolinsky Montgomeryis a 76 y.o.malewith 77 y.o.malewith h/oHIV infection, hypertension, hyperlipidemia. 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  Would need IR BX of vertebrae.  Patient and family would like to hold off for now.  Follow up in 2 weeks with repeat XR T-spine AP/lat  -Metabolic bone consulted                - Consider Bone Scan as this could be a pathologic fracture from bone metastases   - Vitamin D level 42.  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  - Advance diet as tolerated.   - Patient will need Abd CT imaging in 3-4 weeks and advance GI clinic  Follow upon discharge     Insomnia Anxiety  - Psychiatry consulted                -Continue melatonin 9 mg QHS                - Continue gabapentin 600 mg QHS                - Continue Remeron 7.5 mg QHS    Tremors  - neuro consulted.     Adult failure to thrive  severe protein calorie malnutrition  - Continue TPN  - Nutrition consult and MNT protocol  - advance diet as tolerated.     HTN  HLD  - Continue amlodipine 5 mg oral daily  - Continue Lipitor 40 mg oral daily    Insulin-dependent DMII with peripheral neuropathy  - Last HbA1c was 9.3 on 03/17/2021  - Continue Lantus 25 units nightly and SSI  - Continue home neurontin  - Diabetic diet  - Nutrition consult and MNT protocol ordered    History of stroke and Pulmonary embolism  - Continue Eliquis 2.5 mg oral BID    Mild protein calorie malnutrition on TPN  - Albumin 3.2  - Nutrition consult and MNT protocol  - Continue vitamin B12    Hypothyroidism  - Continue Synthroid 88 mcg daily       DVT/PE Prophylaxis: Apixaban    Disposition Planning:  Home discharge      Rana Snare, MD    On 06/26/21 I spent a total visit time of 35 minutes. I spent greater than 50% of the time counseling the patient and/or family regarding the diagnosis and management plan and on the unit coordinating the patient's care. The coordination of care involved services directly related to patient care, including reviewing data, obtaining relevant  patient information, and discussing the case with other involved healthcare providers.      I personally reviewed the labs, imaging, and the chart, and discussed the plan of care with the patient and the consultation teams.

## 2021-06-26 NOTE — Nurses Notes (Addendum)
Paged service MD. Pt. Is nauseated and vomiting. Can not tolerate PO, requesting for IV Zofran.  Still awaiting for doctor's order.    MD ordered Metoclopramide '5mg'$ 

## 2021-06-26 NOTE — Care Plan (Signed)
Problem: Adult Inpatient Plan of Care  Goal: Plan of Care Review  Outcome: Ongoing (see interventions/notes)  Goal: Patient-Specific Goal (Individualized)  Outcome: Ongoing (see interventions/notes)  Flowsheets (Taken 06/25/2021 1915)  Patient would like to participate in bedside shift report: Yes  Individualized Care Needs: asked for Trazodone order  Anxieties, Fears or Concerns: I want to have a good sleep tonight  Patient-Specific Goals (Include Timeframe): get better  Plan of Care Reviewed With: patient  Goal: Absence of Hospital-Acquired Illness or Injury  Outcome: Ongoing (see interventions/notes)  Intervention: Identify and Manage Fall Risk  Recent Flowsheet Documentation  Taken 06/26/2021 0400 by Linden Dolin, RN  Safety Promotion/Fall Prevention:   fall prevention program maintained   nonskid shoes/slippers when out of bed   safety round/check completed  Taken 06/26/2021 0205 by Linden Dolin, RN  Safety Promotion/Fall Prevention:   fall prevention program maintained   nonskid shoes/slippers when out of bed   safety round/check completed  Taken 06/26/2021 0005 by Linden Dolin, RN  Safety Promotion/Fall Prevention:   fall prevention program maintained   nonskid shoes/slippers when out of bed   safety round/check completed  Taken 06/25/2021 2205 by Linden Dolin, RN  Safety Promotion/Fall Prevention:   fall prevention program maintained   nonskid shoes/slippers when out of bed   safety round/check completed  Taken 06/25/2021 1915 by Linden Dolin, RN  Safety Promotion/Fall Prevention:   fall prevention program maintained   nonskid shoes/slippers when out of bed   safety round/check completed  Intervention: Prevent Skin Injury  Recent Flowsheet Documentation  Taken 06/26/2021 0400 by Linden Dolin, RN  Body Position: positioned/repositioned independently  Taken 06/26/2021 0200 by Linden Dolin, RN  Body Position: positioned/repositioned independently  Taken 06/26/2021 0000 by Linden Dolin, RN  Body Position:  positioned/repositioned independently  Taken 06/25/2021 2200 by Linden Dolin, RN  Body Position: positioned/repositioned independently  Taken 06/25/2021 2000 by Linden Dolin, RN  Body Position: positioned/repositioned independently  Taken 06/25/2021 1915 by Linden Dolin, RN  Body Position: positioned/repositioned independently  Skin Protection: adhesive use limited  Intervention: Prevent and Manage VTE (Venous Thromboembolism) Risk  Recent Flowsheet Documentation  Taken 06/25/2021 1915 by Linden Dolin, RN  VTE Prevention/Management:   ambulation promoted   anticoagulant therapy maintained   dorsiflexion/plantar flexion performed  Intervention: Prevent Infection  Recent Flowsheet Documentation  Taken 06/25/2021 1915 by Linden Dolin, RN  Infection Prevention:   rest/sleep promoted   single patient room provided  Goal: Optimal Comfort and Wellbeing  Outcome: Ongoing (see interventions/notes)  Intervention: Provide Person-Centered Care  Recent Flowsheet Documentation  Taken 06/25/2021 1915 by Linden Dolin, [place] Relationship/Rapport:   care explained   choices provided   questions answered   questions encouraged   reassurance provided   thoughts/feelings acknowledged  Goal: Rounds/Family Conference  Outcome: Ongoing (see interventions/notes)     Problem: Fall Injury Risk  Goal: Absence of Fall and Fall-Related Injury  Outcome: Ongoing (see interventions/notes)  Intervention: Promote Injury-Free Environment  Recent Flowsheet Documentation  Taken 06/26/2021 0400 by Linden Dolin, RN  Safety Promotion/Fall Prevention:   fall prevention program maintained   nonskid shoes/slippers when out of bed   safety round/check completed  Taken 06/26/2021 0205 by Linden Dolin, RN  Safety Promotion/Fall Prevention:   fall prevention program maintained   nonskid shoes/slippers when out of bed   safety round/check completed  Taken 06/26/2021 0005 by Linden Dolin, RN  Safety Promotion/Fall Prevention:   fall prevention program  maintained   nonskid shoes/slippers when out of  bed   safety round/check completed  Taken 06/25/2021 2205 by Linden Dolin, RN  Safety Promotion/Fall Prevention:   fall prevention program maintained   nonskid shoes/slippers when out of bed   safety round/check completed  Taken 06/25/2021 1915 by Linden Dolin, RN  Safety Promotion/Fall Prevention:   fall prevention program maintained   nonskid shoes/slippers when out of bed   safety round/check completed     Problem: Fall Injury Risk  Goal: Absence of Fall and Fall-Related Injury  Outcome: Ongoing (see interventions/notes)  Intervention: Promote Injury-Free Environment  Recent Flowsheet Documentation  Taken 06/26/2021 0400 by Linden Dolin, RN  Safety Promotion/Fall Prevention:   fall prevention program maintained   nonskid shoes/slippers when out of bed   safety round/check completed  Taken 06/26/2021 0205 by Linden Dolin, RN  Safety Promotion/Fall Prevention:   fall prevention program maintained   nonskid shoes/slippers when out of bed   safety round/check completed  Taken 06/26/2021 0005 by Linden Dolin, RN  Safety Promotion/Fall Prevention:   fall prevention program maintained   nonskid shoes/slippers when out of bed   safety round/check completed  Taken 06/25/2021 2205 by Linden Dolin, RN  Safety Promotion/Fall Prevention:   fall prevention program maintained   nonskid shoes/slippers when out of bed   safety round/check completed  Taken 06/25/2021 1915 by Linden Dolin, RN  Safety Promotion/Fall Prevention:   fall prevention program maintained   nonskid shoes/slippers when out of bed   safety round/check completed     Problem: Skin Injury Risk Increased  Goal: Skin Health and Integrity  Outcome: Ongoing (see interventions/notes)  Intervention: Optimize Skin Protection  Recent Flowsheet Documentation  Taken 06/25/2021 1915 by Linden Dolin, RN  Pressure Reduction Techniques: frequent weight shift encouraged  Pressure Reduction Devices: (L.M,H,VH) Pressure  Redistributing Chair cushion utilized  Skin Protection: adhesive use limited  Activity Management:   activity adjusted per tolerance   activity clustered for rest periods  Head of Bed (HOB) Positioning: HOB at 20-30 degrees    Plan of care reviewed. Pt AOx4 and cooperative with care this shift. Seems confused and forgetful at times. Denied pain for this shift. Assessment per doc flow. Started TPN and given scheduled antibiotic. Blood sugar monitored and sliding scale ordered followed. Pt free from falls this shift. Needs met with hourly rounding. Due to sleeping position by laying his head over to his left arm where the PICC is located the machine keeps on beeping and pt. Could hardly get a good sleep. RN changed the machine twice. And flushes line several times. Pt resting at this time with call bell in reach. Wheels locked and bed in lowest position. Will continue to monitor.

## 2021-06-26 NOTE — Nurses Notes (Signed)
Dressing change to abdomen completed.

## 2021-06-26 NOTE — Nurses Notes (Signed)
Page service MD if pt. an have Trazodone.  Pt. said he can't sleep. Scheduled Melatonin '9mg'$  given.    MD ordered one time dose trazodone 25 mg.

## 2021-06-27 DIAGNOSIS — K8591 Acute pancreatitis with uninfected necrosis, unspecified: Secondary | ICD-10-CM | POA: Diagnosis present

## 2021-06-27 DIAGNOSIS — S22060A Wedge compression fracture of T7-T8 vertebra, initial encounter for closed fracture: Secondary | ICD-10-CM | POA: Diagnosis present

## 2021-06-27 DIAGNOSIS — R251 Tremor, unspecified: Secondary | ICD-10-CM

## 2021-06-27 DIAGNOSIS — Z8673 Personal history of transient ischemic attack (TIA), and cerebral infarction without residual deficits: Secondary | ICD-10-CM

## 2021-06-27 DIAGNOSIS — R109 Unspecified abdominal pain: Secondary | ICD-10-CM | POA: Diagnosis present

## 2021-06-27 LAB — POC BLOOD GLUCOSE (RESULTS)
GLUCOSE, POC: 86 mg/dl (ref 70–105)
GLUCOSE, POC: 101 mg/dl (ref 70–105)

## 2021-06-27 LAB — ADULT ROUTINE BLOOD CULTURE, SET OF 2 BOTTLES (BACTERIA AND YEAST): BLOOD CULTURE, ROUTINE: NO GROWTH

## 2021-06-27 MED ORDER — CHOLECALCIFEROL (VITAMIN D3) 25 MCG (1,000 UNIT) TABLET
1000.0000 [IU] | ORAL_TABLET | Freq: Every day | ORAL | 0 refills | Status: DC
Start: 2021-06-28 — End: 2022-05-05

## 2021-06-27 MED ORDER — BACITRACIN 500 UNIT/GRAM TOPICAL PACKET
1.0000 | PACK | Freq: Once | CUTANEOUS | Status: DC
Start: 2021-06-27 — End: 2021-06-27
  Filled 2021-06-27: qty 1

## 2021-06-27 MED ORDER — INSULIN REGULAR HUMAN 100 UNIT/ML INJECTION - FOR TUBE FEED
5.0000 [IU] | Freq: Three times a day (TID) | INTRAMUSCULAR | Status: DC
Start: 2021-06-27 — End: 2021-09-02

## 2021-06-27 MED ORDER — INSULIN NPH ISOPHANE U-100 HUMAN 100 UNIT/ML SUBCUTANEOUS SUSPENSION
15.0000 [IU] | Freq: Every morning | SUBCUTANEOUS | Status: DC
Start: 2021-06-27 — End: 2021-09-02

## 2021-06-27 NOTE — Nurses Notes (Signed)
Went over discharge instructions with patient. He states understanding. He will follow up with ortho spine and GI. He will resume home health. Dressing changed today, prior to discharge. PICC line removed. Home meds returned. Prescription sent to [facility] in [place].

## 2021-06-27 NOTE — Progress Notes (Addendum)
[facility]  Medicine Progress Note    [name]  Date of service: 06/27/2021  Date of Admission:  06/21/2021    Hospital Day:  LOS: 5 days   Subjective: Says his appetite and oral intake has improved.  Desires to leave home today.    Vital Signs:  Temp  Avg: 36.9 C (98.4 F)  Min: 36.5 C (97.7 F)  Max: 37.1 C (98.8 F)    Pulse  Avg: 96.8  Min: 89  Max: 110 BP  Min: 89/45  Max: 133/69   Resp  Avg: 16.6  Min: 15  Max: 19 SpO2  Avg: 94.6 %  Min: 93 %  Max: 96 %          Input/Output    Intake/Output Summary (Last 24 hours) at 06/27/2021 1252  Last data filed at 06/27/2021 3976  Gross per 24 hour   Intake 1093.15 ml   Output 1000 ml   Net 93.15 ml    I/O last shift:  05/28 0700 - 05/28 1859  In: 220 [P.O.:220]  Out: -    acetaminophen (TYLENOL) tablet, 650 mg, Oral, Q6HRS  amLODIPine (NORVASC) tablet, 5 mg, Oral, Daily  apixaban (ELIQUIS) tablet, 2.5 mg, Oral, 2x/day  atorvastatin (LIPITOR) tablet, 40 mg, Oral, Daily with Breakfast  cetirizine (ZYRTEC) tablet, 10 mg, Oral, Daily  cholecalciferol (VITAMIN D3) 1000 unit (25 mcg) tablet, 1,000 Units, Oral, Daily  cyanocobalamin (VITAMIN B12) tablet, 1,000 mcg, Oral, QAM  D5W 250 mL flush bag, , Intravenous, Q15 Min PRN  D5W 250 mL flush bag, , Intravenous, Q15 Min PRN  elvitegravir-cobicistat-emtricitrabine-tenofovir (GENVOYA) 150-150-200-10 mg per tablet, 1 Tablet, Oral, Daily  gabapentin (NEURONTIN) capsule, 300 mg, Oral, 2x/day  gabapentin (NEURONTIN) capsule, 600 mg, Oral, QPM  hydrOXYzine pamoate (VISTARIL) capsule, 50 mg, Oral, 4x/day PRN  insulin glargine-yfgn 100 units/mL injection, 25 Units, Subcutaneous, NIGHTLY  levothyroxine (SYNTHROID) tablet, 88 mcg, Oral, QAM  lidocaine-menthol (LIDOPATCH) 3.6%-1.25% patch, 1 Patch, Transdermal, Daily  LR premix infusion, , Intravenous, Continuous  melatonin tablet, 9 mg, Oral, NIGHTLY  metoclopramide (REGLAN) 5 mg/mL injection, 10 mg, Intravenous, Q6H PRN  mirtazapine (REMERON) tablet, 7.5 mg,  Oral, NIGHTLY  NS 250 mL flush bag, , Intravenous, Q15 Min PRN  NS 250 mL flush bag, , Intravenous, Q15 Min PRN  NS flush syringe, 2-6 mL, Intracatheter, Q8HRS  NS flush syringe, 2-6 mL, Intracatheter, Q1 MIN PRN  NS flush syringe, 2-6 mL, Intracatheter, Q8HRS  NS flush syringe, 2-6 mL, Intracatheter, Q1 MIN PRN  oxyCODONE (ROXICODONE) immediate release tablet, 5 mg, Oral, Q4H PRN  pantoprazole (PROTONIX) delayed release tablet, 40 mg, Oral, 2x/day  sennosides-docusate sodium (SENOKOT-S) 8.6-'50mg'$  per tablet, 1 Tablet, Oral, 2x/day  SSIP insulin lispro 100 units/mL injection, 0-12 Units, Subcutaneous, 4x/day PRN        Physical Exam:  Gen:  Alert, fully awake.  On bed in NAD.  HEENT: MMM.  NCAT.  No scleral icterus.  CV: RRR.  No LE edema.  Pulm: CTAB.  Breathing is non-labored.  No accessory muscle use.  GI: Midline surgical site well-healed.  Skin: No rash  Neuro: Mild tremor noted.  MSK: Upper and lower extremities are without gross deformities.  Psych: Appropriate affect.    Labs, Data, and Studies:  Reviewed the most recent lab data.  Reviewed the most recent radiological studies.  Reviewed the most recent medical studies.  Reviewed the latest consultant notes.    Assessment/ Plan:   Active Hospital Problems    Diagnosis   .  Primary Problem: Pneumonia     [name] Bunyard Montgomeryis a 76 y.o.malewith 76 y.o.malewith h/oHIV infection, hypertension, hyperlipidemia. 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  Would need IR  Bx of vertebrae.  Patient and family would like to hold off for now.  Follow up in 2 weeks with repeat XR T-spine AP/lat.  -Metabolic bone consulted                - Consider bone scan as this could be a pathologic fracture from bone metastases   - Vitamin D level 42.  Start vitamin D 1000 units daily.  - Will need outpatient DEXA.    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  - Advance diet as tolerated.   - Patient will need Abd CT imaging in 3-4 weeks and advance GI clinic  Follow upon discharge     Insomnia Anxiety  - Psychiatry consulted                -Continue melatonin 9 mg QHS                - Continue gabapentin 600 mg QHS                - Continue Remeron 7.5 mg QHS    Tremors  - neuro consulted.   - Likely due to acute illness, re-eval outpt after acute issues resolved.    Adult failure to thrive  severe protein calorie malnutrition  - Continue TPN  - Nutrition consult and MNT protocol  - advance diet as tolerated.     HTN  HLD  - Continue amlodipine 5 mg oral daily  - Continue Lipitor 40 mg oral daily    Insulin-dependent DMII with peripheral neuropathy  - Last HbA1c was 9.3 on 03/17/2021  - Continue Lantus 25 units nightly and SSI  - Continue home neurontin  - Diabetic diet  - Nutrition consult and MNT protocol ordered    History of stroke and Pulmonary embolism  - Continue Eliquis 2.5 mg oral BID    Mild protein calorie malnutrition on TPN  - Albumin 3.2  - Nutrition consult and MNT protocol  - Continue vitamin B12    Hypothyroidism  - Continue Synthroid 88 mcg daily     DVT/PE Prophylaxis: Apixaban    Disposition Planning: Home discharge      [name] Snare, MD    Time spent on discharge: 71mnutes.

## 2021-06-27 NOTE — Nurses Notes (Addendum)
No documentation 

## 2021-06-27 NOTE — Care Plan (Addendum)
Problem: Adult Inpatient Plan of Care  Goal: Plan of Care Review  Outcome: Ongoing (see interventions/notes)  Goal: Patient-Specific Goal (Individualized)  Outcome: Ongoing (see interventions/notes)  Flowsheets (Taken 06/26/2021 1915)  Patient would like to participate in bedside shift report: Yes  Individualized Care Needs: Fall precaution  Anxieties, Fears or Concerns: i am not getting the TPN tonight  Patient-Specific Goals (Include Timeframe): go home  Plan of Care Reviewed With: patient  Goal: Absence of Hospital-Acquired Illness or Injury  Outcome: Ongoing (see interventions/notes)  Intervention: Identify and Manage Fall Risk  Recent Flowsheet Documentation  Taken 06/27/2021 0405 by Linden Dolin, RN  Safety Promotion/Fall Prevention:  . fall prevention program maintained  . nonskid shoes/slippers when out of bed  . safety round/check completed  Taken 06/27/2021 0205 by Linden Dolin, RN  Safety Promotion/Fall Prevention:  . fall prevention program maintained  . nonskid shoes/slippers when out of bed  . safety round/check completed  Taken 06/27/2021 0005 by Linden Dolin, RN  Safety Promotion/Fall Prevention:  . fall prevention program maintained  . nonskid shoes/slippers when out of bed  . safety round/check completed  Taken 06/26/2021 2205 by Linden Dolin, RN  Safety Promotion/Fall Prevention:  . fall prevention program maintained  . nonskid shoes/slippers when out of bed  . safety round/check completed  Taken 06/26/2021 1915 by Linden Dolin, RN  Safety Promotion/Fall Prevention:  . fall prevention program maintained  . nonskid shoes/slippers when out of bed  . safety round/check completed  Intervention: Prevent Skin Injury  Recent Flowsheet Documentation  Taken 06/26/2021 1915 by Linden Dolin, RN  Body Position:  . side lying, left  . low-fowlers (15-30 degrees)  Skin Protection: adhesive use limited  Intervention: Prevent and Manage VTE (Venous Thromboembolism) Risk  Recent Flowsheet Documentation  Taken  06/26/2021 1915 by Linden Dolin, RN  VTE Prevention/Management:  . anticoagulant therapy maintained  . dorsiflexion/plantar flexion performed  Intervention: Prevent Infection  Recent Flowsheet Documentation  Taken 06/26/2021 1915 by Linden Dolin, RN  Infection Prevention:  . rest/sleep promoted  . single patient room provided  Goal: Optimal Comfort and Wellbeing  Outcome: Ongoing (see interventions/notes)  Intervention: Provide Person-Centered Care  Recent Flowsheet Documentation  Taken 06/26/2021 1915 by Linden Dolin, RN  Trust Relationship/Rapport:  . care explained  . choices provided  . questions answered  . questions encouraged  . reassurance provided  . thoughts/feelings acknowledged  Goal: Rounds/Family Conference  Outcome: Ongoing (see interventions/notes)     Problem: Fall Injury Risk  Goal: Absence of Fall and Fall-Related Injury  Outcome: Ongoing (see interventions/notes)  Intervention: Identify and Manage Contributors  Recent Flowsheet Documentation  Taken 06/26/2021 1915 by Linden Dolin, RN  Self-Care Promotion: independence encouraged  Intervention: Promote Injury-Free Environment  Recent Flowsheet Documentation  Taken 06/27/2021 0405 by Linden Dolin, RN  Safety Promotion/Fall Prevention:  . fall prevention program maintained  . nonskid shoes/slippers when out of bed  . safety round/check completed  Taken 06/27/2021 0205 by Linden Dolin, RN  Safety Promotion/Fall Prevention:  . fall prevention program maintained  . nonskid shoes/slippers when out of bed  . safety round/check completed  Taken 06/27/2021 0005 by Linden Dolin, RN  Safety Promotion/Fall Prevention:  . fall prevention program maintained  . nonskid shoes/slippers when out of bed  . safety round/check completed  Taken 06/26/2021 2205 by Linden Dolin, RN  Safety Promotion/Fall Prevention:  . fall prevention program maintained  . nonskid shoes/slippers when out of bed  . safety round/check completed  Taken 06/26/2021 1915 by  Linden Dolin,  RN  Safety Promotion/Fall Prevention:  . fall prevention program maintained  . nonskid shoes/slippers when out of bed  . safety round/check completed     Problem: Fall Injury Risk  Goal: Absence of Fall and Fall-Related Injury  Outcome: Ongoing (see interventions/notes)  Intervention: Identify and Manage Contributors  Recent Flowsheet Documentation  Taken 06/26/2021 1915 by Linden Dolin, RN  Self-Care Promotion: independence encouraged  Intervention: Promote Injury-Free Environment  Recent Flowsheet Documentation  Taken 06/27/2021 0405 by Linden Dolin, RN  Safety Promotion/Fall Prevention:  . fall prevention program maintained  . nonskid shoes/slippers when out of bed  . safety round/check completed  Taken 06/27/2021 0205 by Linden Dolin, RN  Safety Promotion/Fall Prevention:  . fall prevention program maintained  . nonskid shoes/slippers when out of bed  . safety round/check completed  Taken 06/27/2021 0005 by Linden Dolin, RN  Safety Promotion/Fall Prevention:  . fall prevention program maintained  . nonskid shoes/slippers when out of bed  . safety round/check completed  Taken 06/26/2021 2205 by Linden Dolin, RN  Safety Promotion/Fall Prevention:  . fall prevention program maintained  . nonskid shoes/slippers when out of bed  . safety round/check completed  Taken 06/26/2021 1915 by Linden Dolin, RN  Safety Promotion/Fall Prevention:  . fall prevention program maintained  . nonskid shoes/slippers when out of bed  . safety round/check completed     Problem: Skin Injury Risk Increased  Goal: Skin Health and Integrity  Outcome: Ongoing (see interventions/notes)  Intervention: Optimize Skin Protection  Recent Flowsheet Documentation  Taken 06/26/2021 1915 by Linden Dolin, RN  Pressure Reduction Techniques: frequent weight shift encouraged  Pressure Reduction Devices: (L.M,H,VH) Pressure Redistributing Chair cushion utilized  Skin Protection: adhesive use limited  Activity Management:  . activity adjusted per  tolerance  . activity clustered for rest periods  Head of Bed [facility]) Positioning: HOB at 20-30 degrees    Plan of care reviewed. Pt AOx4 and cooperative with care this shift. Pain managed with meds per MAR.  Assessment per doc flow. Blood sugar monitored and sliding scale followed as ordered. Pt free from falls this shift. Needs met with hourly rounding. Pt comfortably sleeping and resting at this time with call bell in reach. Wheels locked and bed in lowest position. Will continue to monitor.

## 2021-06-28 ENCOUNTER — Encounter (INDEPENDENT_AMBULATORY_CARE_PROVIDER_SITE_OTHER): Payer: Self-pay | Admitting: Rheumatology

## 2021-06-29 ENCOUNTER — Telehealth (INDEPENDENT_AMBULATORY_CARE_PROVIDER_SITE_OTHER): Payer: Self-pay | Admitting: Rheumatology

## 2021-06-29 NOTE — Telephone Encounter (Addendum)
NOted will be seen by PCP  Loreli Slot, MD    ----- Message from Eben Burow sent at 06/29/2021  8:53 AM EDT -----  Good morning Dr Chrissie Noa,    I called this patient to try and get him scheduled and he stated that he was on his way to an appointment with his PCP to discuss finding care closer to home since he lives a few hours away from [place].    Thank you,  Nickole  ----- Message -----  From: Loreli Slot, MD  Sent: 06/28/2021   9:01 AM EDT  To: Ashley Mariner, RN, [facility]    Please make the patient a return visit (already saw in hospital) because they had a fracture and the doctors are worried they have bone thinning or osteoporosis on   June 7 at 11 am prior to his appt in spine clinic at 2  Once he is done with me he can go over to spine    PUT ON EVEN IF YOU DONT TALK TO HIM/HER. THIS WILL NEGATIVELY IMPACT THE PATIENTS CARE IF NOT SEEN, SO PLEASE TRY ONCE TO CALL PATIENT AND LISTED EMERGENCY CONTACT. THEN SCHEDULE ASAP.  YOU MAY ALSO SEND A LETTER.  THANKS      [name]

## 2021-06-29 NOTE — Letter (Signed)
PATIENT NAME: [name] NUMBER:  [id number]  DATE OF SERVICE: 06/28/2021  DATE OF BIRTH:  1945-03-15      Jun 28, 2021    [facility]  [address] [place], [place]  [address]    Dear Doctors:    When your patient was hospitalized at [facility], we were consulted for a metabolic bone disease consult.  As you know, this is a 76 year old gentleman with a history of hypertension, hyperlipidemia, diabetes, HIV, peripheral neuropathy, hypothyroidism, history of right hip septic arthritis, and reflux on Protonix.  He was admitted to the hospital and incidentally was found to have a CAT scan that showed a T8 fracture and that is why we were consulted.  His risk includes his HIV medication that can cause osteoporosis.  It is Genvoya.  He is also on Protonix.  He has had a history of prostate cancer treated with radiation.  He also has had mucinous neoplasm treated with pancreatectomy and splenectomy in February 2023.  This is his only fracture besides an ankle fracture he had sustained while golfing.  No family history of fractures that he knows of.  He does have a history of weight loss, blood clots, and history of shortness of breath.    His x-rays do show evidence of osteopenia.  His vitamin D was normal at 42, creatinine 0.89, calcium 9.7, TSH 1.2, and PTH 22.    Assessment and Plan:  This is a 76 year old gentleman with a history of prostate cancer and radiation, history of pancreatic cancer, HIV on Genvoya, reflux on Protonix, T8 fracture, and diabetes.  1. We will try to see him as an outpatient and give him an order for a bone density scan.  He may elect to follow with you instead, but this would need evaluation since he is high risk for osteoporosis.  2. His vitamin D level is normal.  3. We will try to see him back the same day he sees the spine doctors on July 07, 2021.  4. If you have any questions or concerns, let us know.    Sincerely  yours,        Loreli Slot, MD  Associate Professor, Section of Rheumatology  [facility] Department of [facility]  [address] [place], [place] [address]       DD:  06/28/2021 09:05:28  DT:  06/28/2021 12:30:06 KLN  D#:  [id number]

## 2021-06-30 ENCOUNTER — Encounter (HOSPITAL_COMMUNITY): Payer: Self-pay

## 2021-06-30 NOTE — Care Management Notes (Signed)
Referral Information  ++++++ Placed Provider #1 ++++++  Case Manager: [facility]  Provider Type: Home Health - Return  Provider Name: Munson [phone])  Address:  [address].  [place], [place] [id number]  Contact: Margretta Ditty    Phone: [phone] x  Fax:   Fax: [phone]

## 2021-06-30 NOTE — Discharge Summary (Signed)
[facility]  DISCHARGE SUMMARY    PATIENT NAME:  [name], [name]  MRN:  [id number]  DOB:  26-Jun-1945    ENCOUNTER DATE:  06/21/2021  INPATIENT ADMISSION DATE: 06/22/2021  DISCHARGE DATE:  06/27/21    ATTENDING PHYSICIAN: No att. providers found  SERVICE: HOSPITALIST 9  PRIMARY CARE PHYSICIAN: [place]   Has PCP been verified with patient and updated? Yes    No lay caregiver identified.      PRIMARY DISCHARGE DIAGNOSIS: Necrotizing pancreatitis  Active Hospital Problems    Diagnosis Date Noted   . Principal Problem: Necrotizing pancreatitis [K85.91] 06/27/2021   . Compression fracture of T8 vertebra (CMS [place]) [S22.060A] 06/27/2021   . Pain in the abdomen [R10.9] 06/27/2021   . Tremor [R25.1] 06/27/2021      Resolved Hospital Problems   No resolved problems to display.     Active Non-Hospital Problems    Diagnosis Date Noted   . S/P exploratory laparotomy 04/03/2021   . Intra abdominal hemorrhage 04/03/2021   . DVT (deep venous thrombosis) (CMS HCC) 04/03/2021   . Pulmonary embolism (CMS HCC) 03/31/2021   . IPMN (intraductal papillary mucinous neoplasm) 03/22/2021   . Cellulitis 01/04/2021   . Abscess of left groin 01/03/2021   . Gram-positive cocci bacteremia 01/03/2021   . Sepsis due to cellulitis (CMS [place]) 01/02/2021   . Lactic acidosis 01/02/2021   . Rash 01/02/2021   . Candida infection of genital region 11/03/2020   . Chronic renal failure syndrome 11/03/2020   . Postherpetic neuralgia 11/03/2020   . Hyperlipidemia 11/03/2020   . Thyroid disorder 11/03/2020   . Sprain of knee 11/03/2020   . Seborrhea 11/03/2020   . Positive laboratory testing for human immunodeficiency virus (CMS [place]) 11/03/2020   . 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 Actinic keratosis 11/03/2020   . Multiple actinic keratoses 11/03/2020   . Malignant neoplasm of prostate (CMS [place]) 11/03/2020   . Incisional hernia 11/03/2020   . Fall 11/03/2020   . Diverticulitis of colon 11/03/2020    . 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[place]) 04/19/2020   . Acute cystitis without hematuria 04/19/2020   . Chest pain 03/24/2020   . Near syncope 03/24/2020   . Acute kidney injury (CMS [place]) 03/24/2020   . 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 GERD without esophagitis 03/24/2020   . Essential hypertension 03/24/2020   . Acquired hypothyroidism 03/24/2020   . Mixed hyperlipidemia 03/24/2020   . HIV infection (CMS [place]) 03/24/2020   . Generalized anxiety disorder 03/13/2020   . Cervicalgia 02/06/2020   . Low back pain 02/06/2020   . Pain in left knee 11/29/2019   . Inflammation of sacroiliac joint (CMS [place]) 02/15/2019   . Acute respiratory distress syndrome (ARDS) (CMS HCC) 01/17/2019   . Depressive disorder 01/17/2019   . Diverticulitis 01/17/2019   . Herpes zoster 01/17/2019   . Asymptomatic HIV infection (CMS [place]) 01/17/2019   . Risk of exposure to communicable disease 08/22/2018   . Nausea 08/22/2018           Current Discharge Medication List      START taking these medications.      Details   cholecalciferol (vitamin D3) 25 mcg (1,000 unit) Tablet  Start taking on: Jun 28, 2021   Take 1 Tablet (1,000 Units total) by mouth Once a day  Qty: 90 Tablet  Refills: 0        CONTINUE  these medications which have CHANGED during your visit.      Details   apixaban 2.5 mg Tablet  Commonly known as: ELIQUIS  What changed: additional instructions   Take 1 Tablet (2.5 mg total) by mouth Twice daily for 90 days  Qty: 180 Tablet  Refills: 0        CONTINUE these medications - NO CHANGES were made during your visit.      Details   acetaminophen 325 mg Tablet  Commonly known as: TYLENOL   Take 2 Tablets (650 mg total) by mouth Every 6 hours as needed for Pain  Refills: 0     amLODIPine 10 mg Tablet  Commonly known as: NORVASC   Take 1 Tablet (10 mg total) by mouth Once a day  Refills: 0     atorvastatin 80 mg Tablet  Commonly known as: LIPITOR   Take 0.5 Tablets (40 mg total) by mouth Every morning with breakfast  Refills: 0      Blood Sugar Diagnostic Strip  Commonly known as: Accu-Chek Guide test strips   1 Strip Four times a day - before meals and bedtime  Qty: 150 Each  Refills: 3     cyanocobalamin 1,000 mcg Tablet  Commonly known as: VITAMIN B 12   Take 1 Tablet (1,000 mcg total) by mouth Every morning  Refills: 0     FreeStyle Libre 2 Reader Misc  Generic drug: flash glucose scanning reader   Use as directed with FreeStyle Libre 2 sensors  Qty: 1 Each  Refills: 1     * FreeStyle Libre 2 Sensor Kit  Generic drug: flash glucose sensor   To check blood glucose continuously. Change every 14 days  Qty: 6 Each  Refills: 3     * FreeStyle Libre 2 Sensor Kit  Generic drug: flash glucose sensor   To check blood glucose continuously. Change every 14 days  Qty: 6 Each  Refills: 3     gabapentin 100 mg Capsule  Commonly known as: NEURONTIN   Take 1 Capsule (100 mg total) by mouth Three times a day  Refills: 0     Genvoya 150-150-200-10 mg Tablet  Generic drug: elviteg-cob-emtri-tenof ALAFEN   Take 1 Tablet by mouth Once a day  Refills: 0     hydrOXYzine pamoate 25 mg Capsule  Commonly known as: VISTARIL   Take 1 Capsule (25 mg total) by mouth Four times a day as needed for Itching  Refills: 0     insulin lispro 100 units/mL Injectable   Inject 0-12 Units under the skin Four times a day as needed for Other  Refills: 0     insulin NPH isoph U-100 human 100 unit/mL Suspension   Inject 15 Units under the skin Every morning before breakfast and 25 units before dinner  Refills: 0     insulin regular human 100 unit/mL Solution  Commonly known as: HUMULIN R   Inject 5 Units under the skin Three times a day  Refills: 0     lancets 28 gauge Misc  Commonly known as: FreeStyle Lancets   Use to test blood sugar 4 times a day - before meals and bedtime  Qty: 150 Each  Refills: 3     levothyroxine 88 mcg Tablet  Commonly known as: SYNTHROID   Take 1 Tablet (88 mcg total) by mouth Every morning  Refills: 0     melatonin 3 mg Tablet   Take 2 Tablets (6 mg  total) by mouth  Every night  Refills: 0     Mirtazapine 7.5 mg Tablet  Commonly known as: REMERON   Take 1 Tablet (7.5 mg total) by mouth Every night  Refills: 0     MULTIVITAMIN ORAL   Take 1 Tablet by mouth Once a day  Refills: 0     oxyCODONE 5 mg Tablet  Commonly known as: ROXICODONE   Take 1 Tablet (5 mg total) by mouth Every 4 hours as needed for Pain  Refills: 0     pantoprazole 40 mg Tablet, Delayed Release (E.C.)  Commonly known as: PROTONIX   Take 1 Tablet (40 mg total) by mouth Twice daily 30 minutes before a meal  Refills: 0     polyethylene glycol 17 gram/dose Powder  Commonly known as: MIRALAX   Take 3 teaspoons (17 g total) by mouth Once a day  Refills: 0     prochlorperazine 10 mg/2 mL (5 mg/mL) Solution  Commonly known as: COMPAZINE   Infuse 2 mL (10 mg total) into a venous catheter Every 6 hours as needed  Refills: 0     scopolamine 1 mg over 3 days patch 3 day  Commonly known as: TRANSDERM SCOP   Place 1 Patch on the skin Every 72 hours  Refills: 0     sennosides-docusate sodium 8.6-50 mg Tablet  Commonly known as: SENOKOT-S   Take 1 Tablet by mouth Twice daily  Refills: 0         * This list has 2 medication(s) that are the same as other medications prescribed for you. Read the directions carefully, and ask your doctor or other care provider to review them with you.              Discharge med list refreshed?  YES     No Known Allergies  HOSPITAL PROCEDURE(S):   Orders Placed This Encounter   Procedures   . GASTROSCOPY     Surgical/Procedural Cases on this Admission     Case IDs Date Procedure Surgeon Location Status    [id number] 06/24/21 NECROSECTOMY Shannan Harper, MD [place] COURSE   cc: shortness of breath  BRIEF HPI:  Mr. Thune is a 33 YOM with a history of HIV, HTN, HLD, DMII with peripheral neuropathy, hypothyroidism who presents to the the emergency department for further evaluation of acute hypoxic respiratory failure, found to  have Ground glass opacities concerning for pneumonia, also has an incidental finding of acute T8 compression fracture found on CT scan. Admit for further management of pneumonia and compression fracture. He appears to be in no distress and is on 2 LPM NC 02 with no baseline O2 requirement.    He denies any fevers, chills, sweats, chest pain, abdominal pain, diarrhea, constipation or any urinary tract symptoms. He was reported to have shakiness, dizziness and confusion. He is able to answer questions appropriately for me. States he is very anxious and was recently started on hydroxyzine. Has a movement clinic visit with Dr. Valere Dross in August 2023    Patient's wife accompanies him states that he [facility] but is starting to eat now.     BRIEF HOSPITAL NARRATIVE:       Chevy Sweigert Montgomeryis a 76 y.o.malewith 76 y.o.malewith h/oHIV infection, hypertension, hyperlipidemia. 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 Would need IR Bx of vertebrae. Patient and family would like to hold off for now. Follow up in 2 weeks with repeat XR T-spineAP/lat.  -Metabolic bone consulted  -Considerbone scan as this could be a pathologic fracture from bone metastases   - Vitamin D level 42. Start vitamin D 1000 units daily.  - Will need outpatient DEXA.    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  - Advance diet as tolerated.  - Patient will need Abd CT imaging in 3-4 weeks and advance GI clinic Follow upon discharge     Insomnia Anxiety  - Psychiatry consulted  -Continue melatonin 9 mg QHS  - Continue gabapentin 600 mg QHS  - Continue Remeron 7.5 mg QHS    Tremors  - neuro consulted.  - Likely due to acute illness, re-eval outpt after acute issues resolved.    Adult failure to thrive  severe protein calorie malnutrition  - Continue TPN  - Nutrition consult and MNT protocol  - advance diet as tolerated.    HTN  HLD  - Continue amlodipine 5 mg oral daily  - Continue Lipitor 40 mg oral daily    Insulin-dependent DMII with peripheral neuropathy  - Last HbA1c was 9.3 on 03/17/2021  - Continue Lantus 25 units nightly and SSI  - Continue home neurontin  - Diabetic diet  - Nutrition consult and MNT protocol ordered    History of stroke and Pulmonary embolism  - Continue Eliquis 2.5 mg oral BID    Mild protein calorie malnutrition on TPN  - Albumin 3.2  - Nutrition consult and MNT protocol  - Continue vitamin B12    Hypothyroidism  - Continue Synthroid 88 mcg daily    TRANSITION/POST DISCHARGE CARE/PENDING TESTS/REFERRALS:   - PCP within 1 week.   - Abd/Pelvis CT in 3-4 wks  - Spine in 2wks with repeat XR    CONDITION ON DISCHARGE:  A. Ambulation: Full ambulation  B. Self-care Ability: Complete  C. Cognitive Status Alert and Oriented x 3  D. Code status at discharge:       LINES/DRAINS/WOUNDS AT DISCHARGE:   Patient Lines/Drains/Airways Status     Active Line / Dialysis Catheter / Dialysis Graft / Drain / Airway / Wound     Name Placement date Placement time Site Days    Wound (Non-Surgical) Anterior;Left;Upper Arm 06/22/21  1523  -- 8    Wound (Non-Surgical) Umbilicus 46/80/32  1224  -- 7     Wound (Non-Surgical) Medial;Upper Abdomen 06/22/21  2000  -- 7    Wound (Non-Surgical) Anterior;Left;Lower;Proximal Leg 06/24/21  2345  -- 5                DISCHARGE DISPOSITION:  Home discharge  DISCHARGE INSTRUCTIONS:  Post-Discharge Follow Up Appointments     Follow up with [place], [place]    Phone: [phone]    Where: [address], [place] [address]    Follow up with Gastroenterology/Hepatology, [place]  Phone: [phone]    Where: [address] [place] [address]    Follow up with Surgical Oncology, [place]    Phone: [phone]    Where: [address] [place] [address]    Wednesday Jul 07, 2021    Return Patient Visit with Charleen Kirks, Hershal Coria at  2:00 PM    Wednesday Jul 14, 2021    Return Telephone Visit with Limmie Patricia, MD at  2:45 PM    Monday Jul 26, 2021    CT Scan with [facility] CT15 at  1:30 PM    Wednesday Aug 11, 2021    Return Patient Visit with Shannan Harper, MD at 10:00 AM    Thursday Aug 12, 2021    New Patient Visit with Marvis Repress, APRN at  9:30 AM    Wednesday Sep 29, 2021    New Patient Visit with Harold Barban, MD at  8:00 AM    Endocrinology, [facility], [place] [address]  [phone] Gastroenterology/Hepatology, [place], [place]  [address]  [place] [address]  [phone] Interventional Radiology, Physician [place], [place]  [address]  [place] [address]  [phone]    Neurology, [facility], [place] [address]  [place], [place]  [address]  [phone] Radiology Department, [facility], [place] [address]  [place]     Is patient Below the Manufacturers weight limits for Location to be Performed? - please Choose by location( 1or more) [place] - under 350 lbs (Can do Appendicular only)    Has the patient had previous Dexa scans? No      XR THORACIC SPINE           Reason for Exam: AP/Lat.  Re-eval T8 compression fracture.      CT ABDOMEN PELVIS W IV CONTRAST           Reason for Exam: re-eval necrotizing pancreatitis    What is the patient's weight in lbs? 72.6 kg (160 lb 0.9 oz) (06/24/2021)    Anesthesia required? No    Is the patient allergic to IV contrast? Note: Shellfish allergy is not a contraindication to iodinated contrast administration and premedication is not recommended. No      Refer to [place]: [place], [place]     Follow-up in: 2 WEEKS    Reason for visit: ED FOLLOW-UP    Follow-up reason: T8 compression fx    Provider: [place]     Follow-up in: [place]    Reason for visit: HOSPITAL DISCHARGE    Follow-up reason: [place].  hospital discharge. s/p necrosectomy.      SCHEDULE FOLLOW-UP - [facility] - SURGICAL ONCOLOGY     Follow-up in: 2 WEEKS    Reason for visit: HOSPITAL DISCHARGE    Follow-up reason: s/p repeat necrosectomy  Rana Snare, MD    Copies sent to Care Team       Relationship Specialty Notifications [place] PCP - General EXTERNAL  04/03/21     Merged    Phone: [phone] Fax: [phone]         [place] [address]    [place], [place]  EXTERNAL  04/03/21     Merged    Phone: [phone] Fax: [phone]         [place] [address]          Referring providers can utilize [email or web address] to access  their referred [place] patient's information.              See inpatient progress note dated the same day as this document for additional discussion of services provided on this hospital day.

## 2021-07-01 ENCOUNTER — Encounter (HOSPITAL_COMMUNITY): Payer: Self-pay

## 2021-07-02 ENCOUNTER — Encounter (HOSPITAL_COMMUNITY): Payer: 59

## 2021-07-06 ENCOUNTER — Encounter (HOSPITAL_COMMUNITY): Admission: RE | Payer: Self-pay | Source: Ambulatory Visit

## 2021-07-06 ENCOUNTER — Telehealth (INDEPENDENT_AMBULATORY_CARE_PROVIDER_SITE_OTHER): Payer: Self-pay | Admitting: SURGICAL ONCOLOGY

## 2021-07-06 ENCOUNTER — Inpatient Hospital Stay (HOSPITAL_COMMUNITY): Admission: RE | Admit: 2021-07-06 | Payer: Medicare Other | Source: Ambulatory Visit | Admitting: Gastroenterology

## 2021-07-06 SURGERY — GASTROSCOPY WITH STENT REMOVAL
Anesthesia: Monitor Anesthesia Care | Site: Mouth

## 2021-07-06 NOTE — Telephone Encounter (Signed)
-----   Message from Nicola Police, MD sent at 07/05/2021  2:43 PM EDT -----  Regarding: RE: Discharge follow up  In person    ----- Message -----  From: [place]ki [place]  Sent: 07/05/2021   2:26 PM EDT  To: Hamilton Capri, RN, Nicola Police, MD  Subject: Discharge follow up                              Dr. Cyndi Bender,     This patient has a discharge order in to see you back in 2 weeks. Which would be next week 07/14/21. I noticed Dr. Anders Grant has a phone call visit with this patient that day. Do you want a phone call visit or an in person visit?    Thanks so much,   [employer]

## 2021-07-06 NOTE — Telephone Encounter (Signed)
Spoke with patient about d/c follow up with Dr. Cyndi Bender on 07/14/21 at 2pm. Patient was agreeable.

## 2021-07-07 ENCOUNTER — Encounter (INDEPENDENT_AMBULATORY_CARE_PROVIDER_SITE_OTHER): Payer: Self-pay | Admitting: Physician Assistant

## 2021-07-14 ENCOUNTER — Encounter (INDEPENDENT_AMBULATORY_CARE_PROVIDER_SITE_OTHER): Payer: Self-pay | Admitting: SURGICAL ONCOLOGY

## 2021-07-14 ENCOUNTER — Ambulatory Visit: Payer: 59 | Attending: SURGICAL ONCOLOGY | Admitting: SURGICAL ONCOLOGY

## 2021-07-14 ENCOUNTER — Ambulatory Visit (INDEPENDENT_AMBULATORY_CARE_PROVIDER_SITE_OTHER): Payer: 59 | Admitting: Student in an Organized Health Care Education/Training Program

## 2021-07-14 ENCOUNTER — Other Ambulatory Visit: Payer: Self-pay

## 2021-07-14 VITALS — BP 105/65 | HR 84 | Temp 96.4°F | Ht 70.0 in | Wt 159.8 lb

## 2021-07-14 DIAGNOSIS — Z90411 Acquired partial absence of pancreas: Secondary | ICD-10-CM

## 2021-07-14 DIAGNOSIS — Z9889 Other specified postprocedural states: Secondary | ICD-10-CM | POA: Insufficient documentation

## 2021-07-14 DIAGNOSIS — D490 Neoplasm of unspecified behavior of digestive system: Secondary | ICD-10-CM | POA: Insufficient documentation

## 2021-07-14 DIAGNOSIS — D136 Benign neoplasm of pancreas: Secondary | ICD-10-CM

## 2021-07-14 MED ORDER — PROCHLORPERAZINE MALEATE 10 MG TABLET
10.0000 mg | ORAL_TABLET | Freq: Three times a day (TID) | ORAL | 1 refills | Status: DC | PRN
Start: 2021-07-14 — End: 2021-07-14

## 2021-07-15 MED ORDER — PROCHLORPERAZINE MALEATE 10 MG TABLET
10.0000 mg | ORAL_TABLET | Freq: Three times a day (TID) | ORAL | 3 refills | Status: DC | PRN
Start: 2021-07-15 — End: 2021-08-15

## 2021-07-19 NOTE — Progress Notes (Signed)
[place] [name] ONCOLOGY   RETURN PATIENT EVALUATION    Patient: [name]  D.O.B.: 1945/04/12  MRN# [id number]  Date of Service: 07/14/2021    HPI:  [name] is a 76 y.o. 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 The patient was diagnosed with PE and started on Eliquis. He presented to outside hospital with abdominal pain and was found to have active extravasation at the surgical site on CT imaging. He underwent mass transfusion protocol and ex lap on 04/03/21 with ligation of bleeding vessel at the pancreatectomy bed. He was transferred to [facility] and postoperatively developed a midline fistula. He underwent multiple procedures for drainage of peripancreatic collections.      Subjective: He reports doing much better.  He is home and working with PT.  He states he almost decided not to use the wheelchair.  He has back pain related to recent spine fracture discovered during last admission. He is eating better.  He has been watching golf and is looking forward to getting back to it.      All other systems reviewed are negative, except those listed above. The patient denies any changes to his medical, surgical, family or social history since his discharge on 06/27/21.    Objective:  Vitals are BP 105/65   Pulse 84   Temp 35.8 C (96.4 F)   Ht 1.778 m ('5\' 10"'$ )   Wt 72.5 kg (159 lb 13.3 oz)   SpO2 93%   BMI 22.93 kg/m     On examination the patient is pleasant, in NAD. Trachea is midline. Heart is RRR. Respirations are unlabored and lungs are CTA. No peripheral edema or cyanosis. Abdomen is soft, non-tender and non-distended, inferior incision wound good granulation tissue at base. Skin has no rashes or jaundice. He is alert and oriented without focal motor or sensory deficits. Speech pattern and mood/affect normal.    Data Reviewed (I have reviewed both the reports and images with the cosigning  physician):     Assessment and Plan:  [name] is a 76 y.o. male with prolonged hospital s/p distal pancreatectomy complicated by bleed requiring reoperation resulting in pancreatic leak and colon fistula.  Patient is recovering with continued improvements in endurance, strength and PO intake. His wound has nearly healed. We will plan for a repeat clinic appointment in around 4 weeks. Patient with IVC filter in place once fully recovered we will refer him back to IR for removal.  All his questions were answered and he agreed with the plan.     Lynwood Dawley, MD, FACS

## 2021-07-26 ENCOUNTER — Ambulatory Visit (HOSPITAL_BASED_OUTPATIENT_CLINIC_OR_DEPARTMENT_OTHER): Payer: 59 | Admitting: Radiology

## 2021-07-26 ENCOUNTER — Ambulatory Visit (HOSPITAL_COMMUNITY): Payer: Self-pay

## 2021-07-29 ENCOUNTER — Other Ambulatory Visit (INDEPENDENT_AMBULATORY_CARE_PROVIDER_SITE_OTHER): Payer: 59

## 2021-07-29 ENCOUNTER — Other Ambulatory Visit: Payer: Self-pay

## 2021-07-29 ENCOUNTER — Other Ambulatory Visit: Payer: 59 | Attending: PHYSICIAN ASSISTANT | Admitting: PHYSICIAN ASSISTANT

## 2021-07-29 DIAGNOSIS — B20 Human immunodeficiency virus [HIV] disease: Secondary | ICD-10-CM | POA: Insufficient documentation

## 2021-07-29 LAB — LIPID PANEL
CHOLESTEROL: 171 mg/dL (ref 0–199)
HDL CHOL: 38 mg/dL — ABNORMAL LOW (ref 40–60)
LDL CALC: 72 mg/dL (ref 0–130)
TRIGLYCERIDES: 303 mg/dL — ABNORMAL HIGH (ref 0–149)
VLDL CALC: 61 mg/dL

## 2021-07-29 LAB — COMPREHENSIVE METABOLIC PANEL, NON-FASTING
ALBUMIN: 3.9 g/dL (ref 3.5–5.0)
ALKALINE PHOSPHATASE: 122 U/L (ref 38–126)
ALT (SGPT): 16 U/L (ref <=50)
ANION GAP: 7 mmol/L
AST (SGOT): 24 U/L (ref 17–59)
BILIRUBIN TOTAL: 0.2 mg/dL (ref 0.2–1.3)
BUN/CREA RATIO: 10
BUN: 11 mg/dL (ref 9–20)
CALCIUM: 10 mg/dL (ref 8.4–10.2)
CHLORIDE: 102 mmol/L (ref 98–107)
CO2 TOTAL: 29 mmol/L (ref 22–30)
CREATININE: 1.1 mg/dL (ref 0.66–1.25)
ESTIMATED GFR: 70 mL/min/{1.73_m2} (ref >=60)
GLUCOSE: 180 mg/dL — ABNORMAL HIGH (ref 74–106)
POTASSIUM: 4.4 mmol/L (ref 3.5–5.1)
PROTEIN TOTAL: 7.1 g/dL (ref 6.3–8.2)
SODIUM: 138 mmol/L (ref 137–145)

## 2021-07-29 LAB — MANUAL DIFFERENTIAL
EOSINOPHIL %: 2 % (ref 0–7)
EOSINOPHIL ABSOLUTE: 0.26 10*3/uL (ref 0.00–0.40)
LYMPHOCYTE %: 50 % — ABNORMAL HIGH (ref 25–40)
LYMPHOCYTE ABSOLUTE: 6.5 10*3/uL — ABNORMAL HIGH (ref 1.00–3.70)
MONOCYTE %: 8 % (ref 0–10)
MONOCYTE ABSOLUTE: 1.04 10*3/uL — ABNORMAL HIGH (ref 0.00–0.70)
NEUTROPHIL %: 40 % (ref 37–81)
NEUTROPHIL ABSOLUTE: 5.2 10*3/uL (ref 1.50–7.00)
PLATELET ESTIMATE: INCREASED
WBC: 13 10*3/uL

## 2021-07-29 LAB — CBC WITH DIFF
HCT: 33.3 % — ABNORMAL LOW (ref 40.7–52.3)
HGB: 9.8 g/dL — ABNORMAL LOW (ref 12.5–16.9)
MCH: 25.1 pg — ABNORMAL LOW (ref 27.0–33.4)
MCHC: 29.4 g/dL — ABNORMAL LOW (ref 31.0–37.0)
MCV: 85.2 fL (ref 84.1–99.3)
PLATELETS: 533 10*3/uL (ref 129–381)
RBC: 3.91 10*6/uL — ABNORMAL LOW (ref 4.09–5.89)
RDW: 18.4 % — ABNORMAL HIGH (ref 10.8–15.2)
WBC: 13 10*3/uL — ABNORMAL HIGH (ref 2.6–9.8)

## 2021-07-29 NOTE — Ancillary Notes (Signed)
Department of Community Practice     Venipuncture performed in office on right arm antecubital vein, dry pressure dressing was applied to site and patient tolerated it well.  Specimen was centrifuged, aliquoted as needed and specimen was labeled and packaged for transport.    Wilford Sports, PHLEBOTOMIST  07/29/2021, 14:12

## 2021-08-02 LAB — CD4/CD8
CD3 # (T CELL): 4702 cells/uL — ABNORMAL HIGH (ref 892–2436)
CD3 % (T CELL): 72 %
CD4 # (HELPER T CELL): 1150 cells/uL (ref 382–1614)
CD4 % (HELPER T CELL): 18 %
CD4:CD8 RATIO: 0.3 — ABNORMAL LOW (ref 1.0–3.4)
CD8 # (SUPPRESSOR T CELL): 3616 cells/uL — ABNORMAL HIGH (ref 157–813)
CD8 % (SUPPRESSOR T CELL): 56 %

## 2021-08-04 LAB — HIV-1 RNA QUANTITATIVE PCR, PLASMA: HIV1 RNA VIRAL LOAD: NOT DETECTED

## 2021-08-09 ENCOUNTER — Other Ambulatory Visit (INDEPENDENT_AMBULATORY_CARE_PROVIDER_SITE_OTHER): Payer: Self-pay | Admitting: SURGICAL ONCOLOGY

## 2021-08-09 DIAGNOSIS — I82409 Acute embolism and thrombosis of unspecified deep veins of unspecified lower extremity: Secondary | ICD-10-CM

## 2021-08-09 MED ORDER — APIXABAN 2.5 MG TABLET
2.5000 mg | ORAL_TABLET | Freq: Two times a day (BID) | ORAL | 0 refills | Status: AC
Start: 2021-08-09 — End: 2021-11-07

## 2021-08-11 ENCOUNTER — Ambulatory Visit (INDEPENDENT_AMBULATORY_CARE_PROVIDER_SITE_OTHER): Payer: Self-pay | Admitting: Gastroenterology

## 2021-08-12 ENCOUNTER — Ambulatory Visit: Payer: 59 | Attending: Nurse Practitioner | Admitting: Nurse Practitioner

## 2021-08-12 ENCOUNTER — Encounter (INDEPENDENT_AMBULATORY_CARE_PROVIDER_SITE_OTHER): Payer: Self-pay | Admitting: Nurse Practitioner

## 2021-08-12 ENCOUNTER — Other Ambulatory Visit: Payer: Self-pay

## 2021-08-12 VITALS — BP 142/88 | Ht 70.0 in | Wt 156.0 lb

## 2021-08-12 DIAGNOSIS — E039 Hypothyroidism, unspecified: Secondary | ICD-10-CM | POA: Insufficient documentation

## 2021-08-12 DIAGNOSIS — I10 Essential (primary) hypertension: Secondary | ICD-10-CM | POA: Insufficient documentation

## 2021-08-12 DIAGNOSIS — E782 Mixed hyperlipidemia: Secondary | ICD-10-CM | POA: Insufficient documentation

## 2021-08-12 DIAGNOSIS — E119 Type 2 diabetes mellitus without complications: Secondary | ICD-10-CM | POA: Insufficient documentation

## 2021-08-12 DIAGNOSIS — Z794 Long term (current) use of insulin: Secondary | ICD-10-CM

## 2021-08-12 LAB — POC HEMOGLOBIN A1C (RESULTS): HEMOGLOBIN A1C, POC: 6.2 % — ABNORMAL HIGH

## 2021-08-12 LAB — POC BLOOD GLUCOSE (RESULTS): GLUCOSE, POC: 177 mg/dl (ref 80–130)

## 2021-08-12 NOTE — Patient Instructions (Signed)
PLEASE arrive 15 minutes PRIOR to your appointment time.    As discussed today in the office:    You should continue current medications and treatment.    Please test blood sugars: CGM or 4 times daily.    Please have lab work completed prior to next appointment.    You should call the office if you experience any side effects from the medication or for significant changes in blood glucose readings. [phone].

## 2021-08-12 NOTE — H&P (Signed)
[facility]  [address]  [place], [place] [address]           [name]  Male  [birth year], Age: 76 y.o.  [id number]    Date of Service: 08/15/2021    Chief complaint: Diabetes                                    HISTORY OF PRESENT ILLNESS:     [name] is a 76 y.o. male with a diagnosis of type 2 diabetes.  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  He has been treated at [facility], however patient does not wish to make a trip to [facility] and has asked for his Services to be transferred to this facility.  The patient presents to the practice today for initial evaluation and treatment.     Patient is wearing a support for a compression fracture of of 2 thoracic vertabrae.  Patient is also HIV positive.      He has the following complications from DM    Complications Yes/No   Retinopathy No   Coronary Artery Disease No   Hypertension Yes   Hyperlipidemia Yes   Gastroparesis No   Chronic Kidney Disease No   Microalbuminuria No   Peripheral Neuropathy No     A1c 6.2 %.  Testing blood glucose levels 4+ times daily, with freestyle Libre CGM. Average blood glucose level 141 mg/dL.  Download of blood glucose monitor shows 0 % in low range < 70 mg/dL, 85 % in target range 70-180 mg/dL, 15 % in high range > 180 mg/dL.  Discussed patient's current treatment with patient and his wife.  Patient has orders for Humulin R 5 units 3 times daily, NPH 15 units every morning before breakfast, insulin sliding scale 4 times daily as needed.  Patient is not using any of these medications.  Discussed with patient the importance of supporting pancreas.However, at this time patient's A1c is very well controlled, and he does not wish to start the insulin, as he does not feel it is necessary. Will monitor.      Disease Management/Health Maintenance:    Daily aspirin No   Lipid Control Yes   ACE/ARB Yes   Eye Exam 2022   Podiatrist No        SUBJECTIVE:     Current health problems:  Past Medical History:   Diagnosis Date   . Cancer (CMS [facility])     prostate   . Deep vein thrombosis (DVT) (CMS HCC)     right leg   . Esophageal reflux    . H/O hearing loss    . High cholesterol    . 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 HTN (hypertension)    . 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 Hyperlipidemia     "borderline"   . Hypothyroidism    . MRSA (methicillin resistant staph aureus) culture positive 01/02/2021    MRSA left groin abscess 01/03/21   . MRSA (methicillin resistant staph aureus) culture positive 01/02/2021    MRSA blood 01/02/21   . Pulmonary embolism (CMS HCC) 03/31/2021   . Thyroid disorder    . Wears glasses            Allergies:  No Known  Allergies    Current medications:    Outpatient Medications Marked as Taking for the 08/12/21 encounter (Office Visit) with Marvis Repress, APRN   Medication Sig   . acetaminophen (TYLENOL) 325 mg Oral Tablet Take 2 Tablets (650 mg total) by mouth Every 6 hours as needed for Pain   . amLODIPine (NORVASC) 10 mg Oral Tablet Take 1 Tablet (10 mg total) by mouth Once a day   . apixaban (ELIQUIS) 2.5 mg Oral Tablet Take 1 Tablet (2.5 mg total) by mouth Twice daily for 90 days   . atorvastatin (LIPITOR) 80 mg Oral Tablet Take 0.5 Tablets (40 mg total) by mouth Every morning with breakfast   . cholecalciferol, vitamin D3, 25 mcg (1,000 unit) Oral Tablet Take 1 Tablet (1,000 Units total) by mouth Once a day   . cyanocobalamin (VITAMIN B 12) 1,000 mcg Oral Tablet Take 1 Tablet (1,000 mcg total) by mouth Every morning   . docusate sodium (COLACE) 100 mg Oral Capsule Take 1 Capsule (100 mg total) by mouth   . elviteg-cob-emtri-tenof ALAFEN (GENVOYA) 150-150-200-10 mg Oral Tablet Take 1 Tablet by mouth Once a day   . flash glucose sensor (FREESTYLE LIBRE 2 SENSOR) Does not apply Kit To check blood glucose continuously. Change every 14 days   . gabapentin  (NEURONTIN) 100 mg Oral Capsule Take 1 Capsule (100 mg total) by mouth Three times a day   . insulin lispro 100 units/mL Subcutaneous Injectable Inject 0-12 Units under the skin Four times a day as needed for Other   . insulin NPH isoph U-100 human 100 unit/mL Subcutaneous Suspension Inject 15 Units under the skin Every morning before breakfast and 25 units before dinner   . insulin regular human (HUMULIN R) 100 unit/mL Injection Solution Inject 5 Units under the skin Three times a day   . lancets (FREESTYLE LANCETS) 28 gauge Misc Use to test blood sugar 4 times a day - before meals and bedtime   . levothyroxine (SYNTHROID) 88 mcg Oral Tablet Take 1 Tablet (88 mcg total) by mouth Every morning   . melatonin 3 mg Oral Tablet Take 2 Tablets (6 mg total) by mouth Every night   . Mirtazapine (REMERON) 7.5 mg Oral Tablet Take 1 Tablet (7.5 mg total) by mouth Every night   . MULTIVITAMIN ORAL Take 1 Tablet by mouth Once a day   . oxyCODONE (ROXICODONE) 5 mg Oral Tablet Take 1 Tablet (5 mg total) by mouth Every 4 hours as needed for Pain   . oxyCODONE-acetaminophen (PERCOCET) 5-325 mg Oral Tablet Take by mouth   . polyethylene glycol (MIRALAX) 17 gram/dose Oral Powder Take 3 teaspoons (17 g total) by mouth Once a day   . sennosides-docusate sodium (SENOKOT-S) 8.6-50 mg Oral Tablet Take 1 Tablet by mouth Twice daily       Health history:  Immunization History   Administered Date(s) Administered   . Covid-19 Vaccine,Moderna Bivalent 01/07/2021   . HAEMOPHILUS B CONJUGATE VACCINE 03/04/2021   . HEPATITIS B VIRUS RECOMB VACCINE 10/11/2013, 11/18/2013, 02/18/2014   . High-Dose Influenza Vaccine, 65+ 11/06/2018   . Influenza Vaccine, 6 month-adult 11/18/2013, 10/27/2014, 10/16/2015, 10/17/2016, 11/03/2017   . Meningococcal B Vaccine - 2 Dose (Bexsero) 03/04/2021   . Meningococcal Vaccine (Menveo) (Admin) 03/04/2021   . PREVNAR 13 12/14/2012, 01/14/2014   . Shingrix - Zoster Vaccine 02/27/2018   . VAXNEUVANCE(PCV 15) 03/04/2021        Past Surgical history:  Past Surgical History:   Procedure Laterality Date   .  COLON SURGERY      per patient had part of colon removed and had a colostomy   . COLONOSCOPY     . GASTROSCOPY     . 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 Parke Simmers   . [place]  2001   . HX COLOSTOMY REVERSAL     . HX HERNIA REPAIR     . KNEE SURGERY Bilateral    . WRIST SURGERY Right            Family History:  Family Medical History:     Problem Relation (Age of Onset)    Cancer Mother, Father, Other    Heart Attack Mother    High Cholesterol Other    Stroke Other            Social History:  Social History     Socioeconomic History   . Marital status: Married     Spouse name: Not on file   . Number of children: Not on file   . Years of education: Not on file   . Highest education level: Not on file   Occupational History   . Not on file   Tobacco Use   . Smoking status: Never   . Smokeless tobacco: Never   Vaping Use   . Vaping Use: Never used   Substance and Sexual Activity   . Alcohol use: Not Currently   . Drug use: Never   . Sexual activity: Not on file   Other Topics Concern   . Calcium intake adequate Not Asked   . Computer Use Not Asked   . Drives Not Asked   . Exercise Concern Not Asked   . Helmet Use Not Asked   . Seat Belt Not Asked   . Uses Cane Not Asked   . Uses walker Not Asked   . Uses wheelchair Not Asked   . Right hand dominant Not Asked   . Left hand dominant Not Asked   . Ambidextrous Not Asked   . Uses Scooter Not Asked   . Activity Limitations Not Asked   . Ability to Walk 1 Flight of Steps without SOB/CP Yes   . Routine Exercise Yes   . Ability to Walk 2 Flight of Steps without SOB/CP Yes   . Unable to Ambulate No   . Total Care No   . Ability To Do Own ADL's Yes   . Uses Walker No   . Other Activity Level Yes   . Uses Cane No   Social History Narrative   . Not on file     Social Determinants of Health     Financial  Resource Strain: Not on file   Transportation Needs: Not on file   Social Connections: Not on file   Intimate Partner Violence: Not on file   Housing Stability: Not on file        Review of Systems   Constitutional: Positive for malaise/fatigue and weight loss. Negative for chills, diaphoresis and fever.   HENT: Positive for hearing loss. Negative for congestion, ear discharge, ear pain, nosebleeds, sinus pain, sore throat and tinnitus.    Eyes: Negative for blurred vision, double vision and photophobia.   Respiratory: Positive for shortness of breath (with deep inspiration). Negative for cough and stridor.    Cardiovascular: Negative for chest pain, palpitations and leg swelling.   Gastrointestinal: Positive for nausea. Negative for abdominal pain,  constipation, diarrhea, heartburn and vomiting.   Genitourinary: Negative for dysuria, frequency and urgency.   Musculoskeletal: Positive for back pain, joint pain and myalgias. Negative for neck pain.   Skin: Negative.    Neurological: Positive for weakness. Negative for dizziness, tingling and headaches.   Endo/Heme/Allergies: Positive for environmental allergies. Negative for polydipsia. Bruises/bleeds easily.   Psychiatric/Behavioral: Negative for depression, memory loss, substance abuse and suicidal ideas. The patient has insomnia. The patient is not nervous/anxious.        Diabetes Monitors  A1C - Glucose - Lipids Microalbumin   Results in Last 18 Months   Lab Test 03/24/20  1051 03/25/20  0443 12/21/20  1453 03/17/21  0917 04/03/21  1151 05/25/21  0552 05/26/21  0320 06/01/21  0632 06/21/21  1709 07/29/21  1412   HA1C  --   --   --  9.3*  --   --   --   --   --   --    GLUCOSEFAST  --   --   --  165*  --   --   --   --   --   --    CHOLESTEROL  --    < > 212*  --   --  108  --   --   --  171   HDLCHOL  --    < > 44  --   --  25*  --   --   --  38*   LDLCHOL  --    < > 96  --   --  45  --   --   --  72   LDLCHOLDIR 89  --   --   --   --   --   --   --   --   --     TRIG  --    < > 361*  --    < > 240*   < > 166* 183* 303*    < > = values in this interval not displayed.    No results for input(s): MICALBRNUR, MICALBCRERAT in the last 13140 hours.     Retinal Exam Date: Not Found DM Retinopathy - Negative Date  Last Foot Exam: Not Found      Last CMP:  Recent Labs     06/21/21  2353 06/22/21  0424 07/29/21  1412   GLUCOSE Negative  --   --    SODIUM  --    < > 138   POTASSIUM  --    < > 4.4   CHLORIDE  --    < > 102   CO2  --    < > 29   ANIONGAP  --    < > 7   BUN  --    < > 11   CREATININE  --    < > 1.10   CALCIUM  --    < > 10.0   ALBUMIN  --    < > 3.9   TOTALPROTEIN  --    < > 7.1   ALKPHOS  --    < > 122   AST  --    < > 24   ALT  --    < > 16   TOTBILIRUBIN  --    < > 0.2    < > = values in this interval not displayed.        Last TSH:  Lab Results  Component Value Date    TSH 1.246 06/22/2021    FREET4 0.68 (L) 05/25/2021       OBJECTIVE:     BP (!) 142/88   Ht 1.778 m (_0 )   Wt 70.8 kg (156 lb)   BMI 22.38 kg/m          Physical Exam  Vitals reviewed.   Constitutional:       Appearance: Normal appearance. He is normal weight.   HENT:      Head: Normocephalic and atraumatic.   Cardiovascular:      Rate and Rhythm: Normal rate and regular rhythm.      Pulses: Normal pulses.      Heart sounds: Normal heart sounds.   Pulmonary:      Effort: Pulmonary effort is normal.      Breath sounds: Normal breath sounds.   Abdominal:      General: Bowel sounds are normal.      Palpations: Abdomen is soft.   Genitourinary:     Comments: Deferred  Musculoskeletal:         General: Normal range of motion.      Cervical back: Normal range of motion.   Skin:     General: Skin is warm and dry.      Capillary Refill: Capillary refill takes less than 2 seconds.   Neurological:      Mental Status: He is alert and oriented to person, place, and time.   Psychiatric:         Mood and Affect: Mood normal.         Behavior: Behavior normal.         Thought Content: Thought content  normal.         Judgment: Judgment normal.          Diabetic foot exam:  Not performed           ASSESSMENT & PLAN:       ICD-10-CM    1. Diabetes mellitus (CMS HCC)  E11.9 POCT HGB A1C     POCT Blood Glucose/Fingerstick (AMB)     POCT Blood Glucose/Fingerstick (AMB)     POCT HGB A1C     POC BLOOD GLUCOSE (RESULTS)     POC HEMOGLOBIN A1C (RESULTS)     COMPREHENSIVE METABOLIC PANEL, NON-FASTING     MICROALBUMIN/CREATININE RATIO, URINE, RANDOM     LIPID PANEL     THYROID STIMULATING HORMONE (SENSITIVE TSH)     THYROXINE, FREE (FREE T4)      2. Hypothyroidism, unspecified type  E03.9 THYROID STIMULATING HORMONE (SENSITIVE TSH)     THYROXINE, FREE (FREE T4)      3. Mixed hyperlipidemia  E78.2 LIPID PANEL      4. Essential hypertension  I10 COMPREHENSIVE METABOLIC PANEL, NON-FASTING            On the day of the encounter, a total of  60 minutes was spent on this patient encounter including review of historical information, examination, documentation and post-visit activities.       Return in about 3 months (around 11/12/2021).    The patient was given the opportunity to ask questions and those questions were answered to the patient's satisfaction. The patient was encouraged to call with any additional questions or concerns.     Discussed with patient effects and side effects of medications. Medication safety was discussed. A copy of the patient's medication list was available. Patient is asked if they want a copy of  medication list and instructions, list is printed and given to the patient if they so desire.     Patient was advised to contact the office within 7 business days if a waiting for a message regarding labs results/ referrals or PA for medications ordered during today's appt.  has not yet  been conveyed to patient.       Portions of this note are dictated using voice recognition software. Variances in spelling and vocabulary are possible and unintentional. Not all errors are caught/corrected. Please notify the  [facility] if any discrepancies are noted or if the meaning of any statement is not clear.    Marvis Repress, APRN  08/12/2021, 10:22  [facility]   Department of Endocrinology  [address]  [place], [place] [address]  Dept: [phone]  Dept Fax: [phone]

## 2021-08-25 ENCOUNTER — Encounter (INDEPENDENT_AMBULATORY_CARE_PROVIDER_SITE_OTHER): Payer: Self-pay | Admitting: SURGICAL ONCOLOGY

## 2021-08-25 ENCOUNTER — Ambulatory Visit: Payer: 59 | Attending: SURGICAL ONCOLOGY | Admitting: SURGICAL ONCOLOGY

## 2021-08-25 ENCOUNTER — Other Ambulatory Visit: Payer: Self-pay

## 2021-08-25 VITALS — BP 112/66 | HR 92 | Temp 97.7°F | Ht 70.0 in | Wt 159.4 lb

## 2021-08-25 DIAGNOSIS — Z95828 Presence of other vascular implants and grafts: Secondary | ICD-10-CM | POA: Insufficient documentation

## 2021-08-25 DIAGNOSIS — Z90411 Acquired partial absence of pancreas: Secondary | ICD-10-CM | POA: Insufficient documentation

## 2021-08-25 DIAGNOSIS — D490 Neoplasm of unspecified behavior of digestive system: Secondary | ICD-10-CM | POA: Insufficient documentation

## 2021-08-25 DIAGNOSIS — Z483 Aftercare following surgery for neoplasm: Secondary | ICD-10-CM | POA: Insufficient documentation

## 2021-08-25 DIAGNOSIS — K632 Fistula of intestine: Secondary | ICD-10-CM | POA: Insufficient documentation

## 2021-08-25 NOTE — Progress Notes (Addendum)
[facility]   RETURN PATIENT EVALUATION    Patient: [name]  D.O.B.: 01/07/1946  MRN# [id number]  Date of Service: 08/25/2021    HPI:  [name] is a 76 y.o. 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 The patient was diagnosed with PE and started on Eliquis. He presented to outside hospital with abdominal pain and was found to have active extravasation at the surgical site on CT imaging. He underwent mass transfusion protocol and ex lap on 04/03/21 with ligation of bleeding vessel at the pancreatectomy bed. He was transferred to [facility] and postoperatively developed a midline fistula. He underwent multiple procedures for drainage of peripancreatic collections. Pt is planning on having IR filter removed on 8/4.    Subjective: He reports doing much better. He was found to have a compression fracture in T8 that is currently being treated nonoperatively with f/u in early August. Pt reports no abdominal pain and is eating more with no n/v. Pt hopes to start golfing in early September.      All other systems reviewed are negative, except those listed above. The patient denies any changes to his medical, surgical, family or social history since his f/u on 6/14    Objective:  Vitals are BP 112/66 (Site: Left, Patient Position: Sitting, Cuff Size: Adult)   Pulse 92   Temp 36.5 C (97.7 F) (Temporal)   Ht 1.778 m ('5\' 10"'$ )   Wt 72.3 kg (159 lb 6.3 oz)   SpO2 99%   BMI 22.87 kg/m     On examination the patient is pleasant, in NAD. Trachea is midline. Heart is RRR. Respirations are unlabored and lungs are CTA. No peripheral edema or cyanosis. Abdomen is soft, non-tender and non-distended, inferior incision wound good granulation tissue at base. Skin has no rashes or jaundice. He is alert and oriented without focal motor or sensory deficits. Speech pattern and mood/affect normal.    Assessment and  Plan:  [name] is a 76 y.o. male with prolonged hospital s/p distal pancreatectomy complicated by bleed requiring reoperation resulting in pancreatic leak and colon fistula.  Patient is recovering with continued improvements in endurance, strength and PO intake. His wound is healed. IR is planning on removing the IVC filter on August 4th. Pt should f/u in clinic with MRI in 2 years.     [name], MD, 14:59  08/25/2021  PGY-3 General Surgery      I saw and examined the patient.  I reviewed the resident's note.  I agree with the findings and plan of care as documented in the resident's note.  Any exceptions/additions are edited/noted.    He has made a full recovery after protracted course.  He is looking forward to returning to golf.  Follow up in 2 years with MRI for IPMN surveillance.  He voiced appreciation for his care.     Aaron Edelman A. Cyndi Bender, MD, FACS

## 2021-08-27 ENCOUNTER — Encounter (INDEPENDENT_AMBULATORY_CARE_PROVIDER_SITE_OTHER): Payer: Self-pay

## 2021-08-27 NOTE — Telephone Encounter (Signed)
Message sent to pt via [place].    [name],    I sent a request to [facility] 08/13/21 and haven't gotten a response.  I re-sent the request this morning.  I wanted to let you know.    Thanks!  Bill Salinas, RN

## 2021-09-02 ENCOUNTER — Ambulatory Visit: Payer: 59 | Attending: Internal Medicine | Admitting: Internal Medicine

## 2021-09-02 ENCOUNTER — Encounter (INDEPENDENT_AMBULATORY_CARE_PROVIDER_SITE_OTHER): Payer: Self-pay | Admitting: Internal Medicine

## 2021-09-02 ENCOUNTER — Other Ambulatory Visit: Payer: Self-pay

## 2021-09-02 ENCOUNTER — Ambulatory Visit (INDEPENDENT_AMBULATORY_CARE_PROVIDER_SITE_OTHER): Payer: 59

## 2021-09-02 VITALS — BP 102/64 | Ht 70.0 in | Wt 163.6 lb

## 2021-09-02 DIAGNOSIS — Z21 Asymptomatic human immunodeficiency virus [HIV] infection status: Secondary | ICD-10-CM

## 2021-09-02 DIAGNOSIS — E1165 Type 2 diabetes mellitus with hyperglycemia: Secondary | ICD-10-CM

## 2021-09-02 DIAGNOSIS — M810 Age-related osteoporosis without current pathological fracture: Secondary | ICD-10-CM | POA: Insufficient documentation

## 2021-09-02 DIAGNOSIS — Z8546 Personal history of malignant neoplasm of prostate: Secondary | ICD-10-CM

## 2021-09-02 DIAGNOSIS — M8000XD Age-related osteoporosis with current pathological fracture, unspecified site, subsequent encounter for fracture with routine healing: Secondary | ICD-10-CM

## 2021-09-02 NOTE — H&P (Signed)
[facility] [place]  [address] [place], [place] [address]  Dept: [phone]  Fax: [phone]    ENDOCRINOLOGY OUTPATIENT CONSULT NOTE    Name: [name]  Age: 77 y.o., Sex: Male  MRN: [id number]    Referred by: [facility]  Reason for Consult: Osteoporosis      HISTORY OF PRESENT ILLNESS:     [name] is a 76 y.o. 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    Pertinent Osteoporosis History    Known history of compression fracture since early 2023.  Atraumatic    Had a DEXA scan recently in June of 2023    T-score of lumbar spine -2.8.  T-score of total femoral hip -2  T-score of femoral neck -1.9    No history of steroid use, narcotic use, illicit drug use, nicotine abuse or alcohol use    Positive for prostate cancer.  On surveillance  Positive for HIV.  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    Past Family History  Mother with cancer and heart disease    Past Surgical History  Distal pancreatectomy    Social History   No history of smoking, alcohol use or illicit drug use      Allergies  No Known Allergies     Medication  History     Current Outpatient Medications   Medication Sig    acetaminophen (TYLENOL) 325 mg Oral Tablet Take 2 Tablets (650 mg total) by mouth Every 6 hours as needed for Pain    amLODIPine (NORVASC) 10 mg Oral Tablet Take 1 Tablet (10 mg total) by mouth Once a day    apixaban (ELIQUIS) 2.5 mg Oral Tablet Take 1 Tablet (2.5 mg total) by mouth Twice daily for 90 days    atorvastatin (LIPITOR) 80 mg Oral Tablet Take 0.5 Tablets (40 mg total) by mouth Every morning with breakfast    Blood Sugar Diagnostic (ACCU-CHEK GUIDE TEST STRIPS) Strip 1 Strip Four times a day - before meals and bedtime    cholecalciferol, vitamin D3, 25 mcg (1,000 unit) Oral Tablet Take 1 Tablet (1,000 Units total) by mouth Once a day    cyanocobalamin (VITAMIN B 12) 1,000 mcg Oral Tablet Take 1 Tablet (1,000 mcg total) by mouth Every morning    docusate sodium (COLACE) 100 mg Oral Capsule Take 1 Capsule (100 mg total) by mouth    elviteg-cob-emtri-tenof ALAFEN (GENVOYA) 150-150-200-10 mg Oral Tablet Take 1 Tablet by mouth Once a day    flash  glucose sensor (FREESTYLE LIBRE 2 SENSOR) Does not apply Kit To check blood glucose continuously. Change every 14 days    gabapentin (NEURONTIN) 100 mg Oral Capsule Take 1 Capsule (100 mg total) by mouth Three times a day    lancets (FREESTYLE LANCETS) 28 gauge Misc Use to test blood sugar 4 times a day - before meals and bedtime    levothyroxine (SYNTHROID) 88 mcg Oral Tablet Take 1 Tablet (88 mcg total) by mouth Every morning    melatonin 3 mg Oral Tablet Take 2 Tablets (6 mg total) by mouth Every night    Mirtazapine (REMERON) 7.5 mg Oral Tablet Take 1 Tablet (7.5 mg total) by mouth Every night    MULTIVITAMIN ORAL Take 1 Tablet by mouth Once a day    oxyCODONE (ROXICODONE) 5 mg Oral Tablet Take 1 Tablet (5 mg total) by mouth Every 4 hours as needed for Pain    oxyCODONE-acetaminophen (PERCOCET) 5-325 mg Oral Tablet Take by mouth    polyethylene glycol (MIRALAX) 17 gram/dose Oral Powder Take 3 teaspoons  (17 g total) by mouth Once a day    scopolamine (TRANSDERM SCOP) 1 mg over 3 days Transdermal patch 3 day Place 1 Patch on the skin Every 72 hours    sennosides-docusate sodium (SENOKOT-S) 8.6-50 mg Oral Tablet Take 1 Tablet by mouth Twice daily          OBJECTIVE:     BP 102/64   Ht 1.778 m (_0 )   Wt 74.2 kg (163 lb 9.3 oz)   BMI 23.47 kg/m          Physical Exam  Constitutional:       General: He is not in acute distress.     Appearance: He is not ill-appearing.   Neurological:      General: No focal deficit present.      Mental Status: He is alert and oriented to person, place, and time. Mental status is at baseline.   Psychiatric:         Mood and Affect: Mood normal.         Behavior: Behavior normal.         Thought Content: Thought content normal.         Judgment: Judgment normal.       Lab Investigations and Imaging  I have personally reviewed and interpreted all pertinent labs and imaging    BASIC METABOLIC PANEL  Lab Results   Component Value Date    SODIUM 138 07/29/2021    POTASSIUM 4.4 07/29/2021    CHLORIDE 102 07/29/2021    CO2 29 07/29/2021    ANIONGAP 7 07/29/2021    BUN 11 07/29/2021    CREATININE 1.10 07/29/2021    BUNCRRATIO 10 07/29/2021    GFR 70 07/29/2021    CALCIUM 10.0 07/29/2021    GLUCOSENF 180 (H) 07/29/2021              ASSESSMENT & PLAN:       ICD-10-CM    1. Osteoporosis, unspecified osteoporosis type, unspecified pathological fracture presence  M81.0 ALBUMIN     CALCIUM     VITAMIN D 25 TOTAL     CREATININE WITH EGFR      2. Type 2 diabetes mellitus with hyperglycemia, without long-term current use of insulin (CMS HCC)  E11.65          Diabetes mellitus.  A1c 6.2%    Type 2 diabetes mellitus superimposed with hyperglycemia from HAART therapy.  Unlikely related to pancreatic insufficiency      - not on any medications at this time  - but was on insulin therapy for 4 months while admitted at [facility]  - Dx in feb 2023 in the hospital    Plan  - continue to monitor.   - A1c in  follow up visit  - check glucose TID AC and HS. Has a free style sensor  - discussed pathophysiology of above      Osteoporosis    Risk factors: age, h/o prostate cancer, h/o HIV    Positive for atraumatic compression fractures.     Reviewed outside records.   T score of lumbar spine -2.8    T-score of total femoral hip -2  T-score of femoral neck -1.9    No images to review    Anabolic agents contraindicated.   Not ideal for prolia give h/o immunosuppression with prostate cancer and HIV    Plan  - iv reclast.   - calcium, albumin, vitamin D and GFR  - recommended 1000-2000 units vitamin D per day  - DEXA scan in June 2025      Education  - talked about fall precautions. Recommended to remove area rugs, secure loose wires, install night lights and bathroom rails.   - recommend against high impact activities such as skiing, horseback riding etc.   - recommended 3-4 servings of dairy per day including cheeses, mild, yoghurt.   - recommended at least 30-60 mins of weight bearing exercises including walking and mild resistance training to improve muscle strength.        Return in about 3 months (around 12/03/2021).    The above plan of care communicated to [facility]   Thank you for the Consult. Please feel free to contact me at any time with questions or concerns.     On the day of the encounter, a total of  60 minutes was spent on this patient encounter including review of historical information, examination, documentation and post-visit activities.      Lin Landsman, MD    09/02/2021  15:47

## 2021-09-03 ENCOUNTER — Telehealth (INDEPENDENT_AMBULATORY_CARE_PROVIDER_SITE_OTHER): Payer: Self-pay | Admitting: Internal Medicine

## 2021-09-03 NOTE — Telephone Encounter (Signed)
Pt. Left message stating he will get labs drawn Monday fasting.  He could not remember how much calcium and Vitamin D the doctor wanted him to take.  I was able to see the recommendation for Viatmin D daily but did not see recommendation on calcium.  I told him i felt like it would be at least '1000mg'$  daily.  Please advise.

## 2021-09-06 ENCOUNTER — Telehealth (INDEPENDENT_AMBULATORY_CARE_PROVIDER_SITE_OTHER): Payer: Self-pay | Admitting: Internal Medicine

## 2021-09-06 ENCOUNTER — Other Ambulatory Visit: Payer: Medicare Other | Attending: Internal Medicine

## 2021-09-06 ENCOUNTER — Other Ambulatory Visit: Payer: Self-pay

## 2021-09-06 DIAGNOSIS — E119 Type 2 diabetes mellitus without complications: Secondary | ICD-10-CM | POA: Insufficient documentation

## 2021-09-06 DIAGNOSIS — M810 Age-related osteoporosis without current pathological fracture: Secondary | ICD-10-CM | POA: Insufficient documentation

## 2021-09-06 DIAGNOSIS — I10 Essential (primary) hypertension: Secondary | ICD-10-CM | POA: Insufficient documentation

## 2021-09-06 DIAGNOSIS — E782 Mixed hyperlipidemia: Secondary | ICD-10-CM | POA: Insufficient documentation

## 2021-09-06 DIAGNOSIS — E039 Hypothyroidism, unspecified: Secondary | ICD-10-CM | POA: Insufficient documentation

## 2021-09-06 LAB — COMPREHENSIVE METABOLIC PANEL, NON-FASTING
ALBUMIN: 3.7 g/dL (ref 3.4–4.8)
ALKALINE PHOSPHATASE: 122 U/L — ABNORMAL HIGH (ref 45–115)
ALT (SGPT): 14 U/L (ref 10–55)
ANION GAP: 10 mmol/L (ref 4–13)
AST (SGOT): 18 U/L (ref 8–45)
BILIRUBIN TOTAL: 0.3 mg/dL (ref 0.3–1.3)
BUN/CREA RATIO: 14 (ref 6–22)
BUN: 18 mg/dL (ref 8–25)
CALCIUM: 10.2 mg/dL (ref 8.8–10.2)
CHLORIDE: 107 mmol/L (ref 96–111)
CO2 TOTAL: 25 mmol/L (ref 23–31)
CREATININE: 1.27 mg/dL (ref 0.75–1.35)
ESTIMATED GFR: 59 mL/min/BSA — ABNORMAL LOW (ref >=60)
GLUCOSE: 110 mg/dL (ref 65–125)
POTASSIUM: 3.9 mmol/L (ref 3.5–5.1)
PROTEIN TOTAL: 7.4 g/dL (ref 6.0–8.0)
SODIUM: 142 mmol/L (ref 136–145)

## 2021-09-06 LAB — LIPID PANEL
CHOL/HDL RATIO: 4.8
CHOLESTEROL: 160 mg/dL (ref 100–200)
HDL CHOL: 33 mg/dL — ABNORMAL LOW (ref >=50)
LDL CALC: 83 mg/dL (ref <100)
NON-HDL: 127 mg/dL (ref <=190)
TRIGLYCERIDES: 262 mg/dL — ABNORMAL HIGH (ref <150)
VLDL CALC: 41 mg/dL — ABNORMAL HIGH (ref <30)

## 2021-09-06 LAB — THYROXINE, FREE (FREE T4): THYROXINE (T4), FREE: 0.79 ng/dL (ref 0.70–1.25)

## 2021-09-06 LAB — VITAMIN D 25 TOTAL: VITAMIN D 25, TOTAL: 50.9 ng/mL (ref 30.0–100.0)

## 2021-09-06 LAB — MICROALBUMIN/CREATININE RATIO, URINE, RANDOM
CREATININE RANDOM URINE: 71 mg/dL (ref 50–100)
MICROALBUMIN RANDOM URINE: 1.2 mg/dL
MICROALBUMIN/CREATININE RATIO RANDOM URINE: 16.9 mg/g (ref <30.0)

## 2021-09-06 LAB — THYROID STIMULATING HORMONE (SENSITIVE TSH): TSH: 1.372 u[IU]/mL (ref 0.430–3.550)

## 2021-09-06 NOTE — Telephone Encounter (Signed)
For now recommend to take 1000-2000 units vitamin D once daily.   Will wait for labs for further recommendations.

## 2021-09-06 NOTE — Telephone Encounter (Signed)
GFR, corrected calcium and vitamin D acceptable.     Pt to discuss other labs with ordering provider.     Continue 1000 units vitamin D    Proceed with iv reclast

## 2021-09-08 ENCOUNTER — Other Ambulatory Visit: Payer: Self-pay

## 2021-09-09 NOTE — Telephone Encounter (Signed)
Pt. Notified.  Reclast scheduled for Monday.    Shona Simpson, RN

## 2021-09-10 DIAGNOSIS — M810 Age-related osteoporosis without current pathological fracture: Secondary | ICD-10-CM | POA: Insufficient documentation

## 2021-09-13 ENCOUNTER — Ambulatory Visit
Admission: RE | Admit: 2021-09-13 | Discharge: 2021-09-13 | Disposition: A | Discharge status: Still In Hospital | Payer: 59 | Source: Ambulatory Visit | Attending: Internal Medicine | Admitting: Internal Medicine

## 2021-09-13 ENCOUNTER — Encounter (INDEPENDENT_AMBULATORY_CARE_PROVIDER_SITE_OTHER): Payer: Self-pay

## 2021-09-13 DIAGNOSIS — M810 Age-related osteoporosis without current pathological fracture: Secondary | ICD-10-CM | POA: Insufficient documentation

## 2021-09-13 DIAGNOSIS — Z978 Presence of other specified devices: Secondary | ICD-10-CM | POA: Insufficient documentation

## 2021-09-13 MED ORDER — ZOLEDRONIC ACID 5 MG/100 ML IN MANNITOL 5 %-WATER INTRAVENOUS PIGGYBCK
5.0000 mg | INJECTION | Freq: Once | INTRAVENOUS | Status: AC
Start: 2021-09-13 — End: 2021-09-13
  Administered 2021-09-13: 0 mg via INTRAVENOUS
  Administered 2021-09-13: 5 mg via INTRAVENOUS
  Filled 2021-09-13: qty 100

## 2021-09-13 NOTE — Nurses Notes (Deleted)
Reclast infusion given per order. Physician available. No concerns voiced

## 2021-09-13 NOTE — Nurses Notes (Signed)
Reclast given per order. Physician available. No concerns voiced.

## 2021-09-14 ENCOUNTER — Other Ambulatory Visit: Payer: Medicare Other | Attending: INFECTIOUS DISEASE | Admitting: INFECTIOUS DISEASE

## 2021-09-14 ENCOUNTER — Other Ambulatory Visit (INDEPENDENT_AMBULATORY_CARE_PROVIDER_SITE_OTHER): Payer: 59

## 2021-09-14 ENCOUNTER — Other Ambulatory Visit: Payer: Self-pay

## 2021-09-14 DIAGNOSIS — R238 Other skin changes: Secondary | ICD-10-CM | POA: Insufficient documentation

## 2021-09-14 DIAGNOSIS — N489 Disorder of penis, unspecified: Secondary | ICD-10-CM | POA: Insufficient documentation

## 2021-09-14 LAB — CHLAMYDIA / NEISSERIA DNA BY PCR
CHLAMYDIA TRACHOMATIS PCR: NOT DETECTED
NEISSERIA GONORRHOEAE PCR: NOT DETECTED

## 2021-09-14 NOTE — Ancillary Notes (Signed)
Department of Community Practice     Venipuncture performed in office on right arm antecubital vein, dry pressure dressing was applied to site and patient tolerated it well.  Specimen was centrifuged, aliquoted as needed and specimen was labeled and packaged for transport.    Francetta Found, PHLEBOTOMIST  09/14/2021, 12:06

## 2021-09-15 ENCOUNTER — Telehealth (INDEPENDENT_AMBULATORY_CARE_PROVIDER_SITE_OTHER): Payer: Self-pay | Admitting: Internal Medicine

## 2021-09-15 ENCOUNTER — Encounter (HOSPITAL_BASED_OUTPATIENT_CLINIC_OR_DEPARTMENT_OTHER): Payer: 59 | Admitting: Internal Medicine

## 2021-09-15 DIAGNOSIS — Z978 Presence of other specified devices: Secondary | ICD-10-CM

## 2021-09-15 LAB — HERPES SIMPLEX VIRUS (HSV1/HSV2), PCR, CSF OR SWAB
HERPES SIMPLEX VIRUS 1: NEGATIVE
HERPES SIMPLEX VIRUS 2: POSITIVE — AB

## 2021-09-15 LAB — SYPHILIS, RAPID PLASMIN REAGIN (RPR), WITH TITER REFLEX: RPR QUALITATIVE: NONREACTIVE

## 2021-09-15 NOTE — Telephone Encounter (Signed)
Average days of CGM 14 days  Average blood glucose 153 mg/dl  GMI 7%     71% in range (70-180 mg/dl)    26% high (180 - 250 mg/dl)  2% very high (>250 mg/dl)    1% low (70-54 mg/dl)  0% very low (<54 mg/dl)      Recommendations  Pt not on any medications.   Advise pt to confirm low glucose on CGM with a Finger stick glucose.     Please notify pt

## 2021-09-15 NOTE — Procedures (Signed)
Average days of CGM 14 days  Average blood glucose 153 mg/dl  GMI 7%     71% in range (70-180 mg/dl)    26% high (180 - 250 mg/dl)  2% very high (>250 mg/dl)    1% low (70-54 mg/dl)  0% very low (<54 mg/dl)      Recommendations  Pt not on any medications.   Advise pt to confirm low glucose on CGM with a Finger stick glucose.

## 2021-09-16 ENCOUNTER — Encounter (INDEPENDENT_AMBULATORY_CARE_PROVIDER_SITE_OTHER): Payer: Self-pay

## 2021-09-16 LAB — HERPES SIMPLEX VIRUS (HSV) TYPE 1- AND TYPE 2-SPECIFIC ABS, IGG
HSV-1 IGG QUALITATIVE: POSITIVE — AB
HSV2 IGG QUALITATIVE: POSITIVE — AB

## 2021-09-16 NOTE — Telephone Encounter (Signed)
Pt. Notified via MyChart.    Jade Burright, RN

## 2021-09-29 ENCOUNTER — Ambulatory Visit (INDEPENDENT_AMBULATORY_CARE_PROVIDER_SITE_OTHER): Payer: Self-pay | Admitting: Student in an Organized Health Care Education/Training Program

## 2021-09-29 ENCOUNTER — Ambulatory Visit (INDEPENDENT_AMBULATORY_CARE_PROVIDER_SITE_OTHER): Payer: Self-pay | Admitting: Neurology

## 2021-11-12 ENCOUNTER — Ambulatory Visit: Payer: Medicare Other | Attending: Nurse Practitioner | Admitting: Nurse Practitioner

## 2021-11-12 ENCOUNTER — Encounter (INDEPENDENT_AMBULATORY_CARE_PROVIDER_SITE_OTHER): Payer: Self-pay | Admitting: Nurse Practitioner

## 2021-11-12 ENCOUNTER — Other Ambulatory Visit: Payer: Self-pay

## 2021-11-12 VITALS — BP 104/62 | Ht 70.0 in | Wt 160.0 lb

## 2021-11-12 DIAGNOSIS — E1165 Type 2 diabetes mellitus with hyperglycemia: Secondary | ICD-10-CM

## 2021-11-12 LAB — POC HEMOGLOBIN A1C (RESULTS): HEMOGLOBIN A1C, POC: 7.8 % — ABNORMAL HIGH

## 2021-11-12 LAB — POC BLOOD GLUCOSE (RESULTS): GLUCOSE, POC: 154 mg/dl (ref 80–130)

## 2021-11-12 NOTE — Progress Notes (Signed)
ENDOCRINOLOGY, [facility] [place]  [place] [address]  Operated by [facility]  History and Physical     Name: Kirill Chatterjee MRN:  [id number]   Date: 11/12/2021 Age: 76 y.o.            Date:   11/12/2021  Name: Seanpatrick Maisano  Age: 76 y.o.  PCP: [facility]    Chief Complaint: Diabetes    Subjective:     HPI:    Nial Hawe is a 76 y.o. year old male who with type 2 diabetes who was inadvertently scheduled on this provider's schedule.  Patient will see Dr. Lin Landsman, MD on 11/25/2021     A1c testing was completed with results of:  A1c 7.8 %.          Assessment and Plan    ICD-10-CM    1. Type 2 diabetes mellitus with hyperglycemia, without long-term current use of insulin (CMS HCC)  E11.65 POCT Blood Glucose/Fingerstick (AMB)     POCT HGB A1C             Return in about 13 days (around 11/25/2021).          On the day of the encounter, a total of 20 minutes was spent on this patient encounter including review of historical information, examination, documentation and post-visit activities.         Marvis Repress, APRN  11/12/2021, 10:06  [facility]   Department of Endocrinology  [address]  [place], [place] [address]  Dept: [phone]  Dept Fax: [phone]

## 2021-11-12 NOTE — Patient Instructions (Signed)
Follow with Dr. Colonel Bald as scheduled.

## 2021-11-12 NOTE — Progress Notes (Deleted)
ENDOCRINOLOGY, [facility]  [place] [address]  Operated by [facility]  Progress Note    Name: [name] MRN:  [id number]   Date: 11/12/2021 Age: 76 y.o.       Subjective:     Patient ID:   [name] is an 76 y.o. male.      Chief Complaint:      Chief Complaint   Patient presents with    Diabetes        HPI    ROS    Objective:   Physical Exam    Assessment & Plan:       ICD-10-CM    1. Type 2 diabetes mellitus with hyperglycemia, without long-term current use of insulin (CMS HCC)  E11.65 POCT Blood Glucose/Fingerstick (AMB)     POCT HGB A1C          Marvis Repress, APRN

## 2021-11-25 ENCOUNTER — Encounter (INDEPENDENT_AMBULATORY_CARE_PROVIDER_SITE_OTHER): Payer: Self-pay | Admitting: Internal Medicine

## 2021-11-25 ENCOUNTER — Telehealth (INDEPENDENT_AMBULATORY_CARE_PROVIDER_SITE_OTHER): Payer: Self-pay | Admitting: Internal Medicine

## 2021-11-25 ENCOUNTER — Other Ambulatory Visit: Payer: Self-pay

## 2021-11-25 ENCOUNTER — Ambulatory Visit: Payer: 59 | Attending: Internal Medicine | Admitting: Internal Medicine

## 2021-11-25 ENCOUNTER — Ambulatory Visit (INDEPENDENT_AMBULATORY_CARE_PROVIDER_SITE_OTHER): Payer: 59

## 2021-11-25 VITALS — BP 110/66 | Ht 70.0 in | Wt 163.1 lb

## 2021-11-25 DIAGNOSIS — E1165 Type 2 diabetes mellitus with hyperglycemia: Secondary | ICD-10-CM

## 2021-11-25 DIAGNOSIS — Z713 Dietary counseling and surveillance: Secondary | ICD-10-CM | POA: Insufficient documentation

## 2021-11-25 DIAGNOSIS — Z978 Presence of other specified devices: Secondary | ICD-10-CM

## 2021-11-25 DIAGNOSIS — Z7182 Exercise counseling: Secondary | ICD-10-CM | POA: Insufficient documentation

## 2021-11-25 LAB — CREATININE WITH EGFR
CREATININE: 1.42 mg/dL — ABNORMAL HIGH (ref 0.75–1.35)
ESTIMATED GFR - MALE: 51 mL/min/BSA — ABNORMAL LOW (ref >=60)

## 2021-11-25 MED ORDER — METFORMIN ER 500 MG TABLET,EXTENDED RELEASE 24 HR
ORAL_TABLET | ORAL | 3 refills | Status: DC
Start: 2021-11-25 — End: 2022-02-04

## 2021-11-25 NOTE — Telephone Encounter (Signed)
Pt. Notified.    Shaneka Efaw, RN

## 2021-11-25 NOTE — Patient Instructions (Signed)
Start metformin as instructed:   Week 1: 500 mg extended release once daily  Week 2:  500 mg twice daily  Week 3:  1000 mg every morning and 500 mg every evening  Week 4:  1000 mg twice daily      No sugary beverages. (e.g sodas, milk, juices)  No diet or zero sugar beverages or beverages with artificial sweeteners   No snacking in between meals.     Check blood glucose 3 times daily before meals and at bedtime.  Call our office if blood glucose is <70 or >200 mg/dl for 3 or more consecutive days.

## 2021-11-25 NOTE — Telephone Encounter (Signed)
GFR 51.   May continue metformin as instructed during clinic    Please notify pt

## 2021-11-25 NOTE — Progress Notes (Signed)
[facility] for [place]  [address], [place], [place] [address]  Dept: [phone]  Fax: [phone]    ENDOCRINOLOGY OUTPATIENT PROGRESS NOTE    Name: [name]  Age: 76 y.o., Sex: Male  MRN: [id number]    Referred by: [facility]  Reason for Consult: Diabetes Mellitus    HISTORY OF PRESENT ILLNESS:     [name] is a 76 y.o. male  who is here for his visit for diabetes.   Recently established care for osteoporosis.     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 A1c 9.3%    Following his discharge, patient has stopped taking all insulin  A1c is 6.2% in July of 2023    Has a freestyle [place].    Most recent A1c is 7.8%    Pertinent Osteoporosis History    Known history of compression fracture since early 2023.  Atraumatic    Had a DEXA scan recently in June of 2023    T-score of lumbar spine -2.8.  T-score of total femoral hip -2  T-score of femoral neck -1.9    No history of steroid use, narcotic use, illicit drug use, nicotine abuse or alcohol use    Positive for prostate cancer.  On surveillance  Positive for HIV.  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    Past Family History  Mother with cancer and heart disease    Past Surgical History  Distal pancreatectomy    Social History   No history of smoking, alcohol use or illicit drug use      Allergies  No Known Allergies     Medication History     Current Outpatient Medications    Medication Sig    acetaminophen (TYLENOL) 325 mg Oral Tablet Take 2 Tablets (650 mg total) by mouth Every 6 hours as needed for Pain    amLODIPine (NORVASC) 10 mg Oral Tablet Take 1 Tablet (10 mg total) by mouth Once a day    atorvastatin (LIPITOR) 80 mg Oral Tablet Take 0.5 Tablets (40 mg total) by mouth Every morning with breakfast    cholecalciferol, vitamin D3, 25 mcg (1,000 unit) Oral Tablet Take 1 Tablet (1,000 Units total) by mouth Once a day    cyanocobalamin (VITAMIN B 12) 1,000 mcg Oral Tablet Take 1 Tablet (1,000 mcg total) by mouth Every morning    docusate sodium (COLACE) 100 mg Oral Capsule Take 1 Capsule (100 mg total) by mouth    elviteg-cob-emtri-tenof ALAFEN (GENVOYA) 150-150-200-10 mg Oral Tablet Take 1 Tablet by mouth Once a day    flash glucose sensor (FREESTYLE LIBRE 2 SENSOR) Does not apply Kit To check blood glucose continuously. Change every 14 days    gabapentin (NEURONTIN) 100 mg Oral Capsule Take 1 Capsule (100 mg total) by mouth Three times a day    levothyroxine (SYNTHROID) 88 mcg Oral Tablet Take 1 Tablet (88 mcg  total) by mouth Every morning    melatonin 3 mg Oral Tablet Take 2 Tablets (6 mg total) by mouth Every night    metFORMIN (GLUCOPHAGE XR) 500 mg Oral Tablet Sustained Release 24 hr 1000 mg twice daily. Increase by 500 mg every week for max dose of 2000 mg    Mirtazapine (REMERON) 7.5 mg Oral Tablet Take 1 Tablet (7.5 mg total) by mouth Every night    MULTIVITAMIN ORAL Take 1 Tablet by mouth Once a day    oxyCODONE (ROXICODONE) 5 mg Oral Tablet Take 1 Tablet (5 mg total) by mouth Every 4 hours as needed for Pain    polyethylene glycol (MIRALAX) 17 gram/dose Oral Powder Take 3 teaspoons (17 g total) by mouth Once a day    sennosides-docusate sodium (SENOKOT-S) 8.6-50 mg Oral Tablet Take 1 Tablet by mouth Twice daily          OBJECTIVE:     BP 110/66   Ht 1.778 m ('5\' 10"'$ )   Wt 74 kg (163 lb 2.3 oz)   BMI 23.41 kg/m          Physical Exam  Constitutional:       General: He  is not in acute distress.     Appearance: He is not ill-appearing.   Neurological:      General: No focal deficit present.      Mental Status: He is alert and oriented to person, place, and time. Mental status is at baseline.   Psychiatric:         Mood and Affect: Mood normal.         Behavior: Behavior normal.         Thought Content: Thought content normal.         Judgment: Judgment normal.         Lab Investigations and Imaging  I have personally reviewed and interpreted all pertinent labs and imaging    BASIC METABOLIC PANEL  Lab Results   Component Value Date    SODIUM 142 09/06/2021    POTASSIUM 3.9 09/06/2021    CHLORIDE 107 09/06/2021    CO2 25 09/06/2021    ANIONGAP 10 09/06/2021    BUN 18 09/06/2021    CREATININE 1.27 09/06/2021    BUNCRRATIO 14 09/06/2021    GFR 59 (L) 09/06/2021    CALCIUM 10.2 09/06/2021    GLUCOSENF 110 09/06/2021              ASSESSMENT & PLAN:       ICD-10-CM    1. Uncontrolled type 2 diabetes mellitus with hyperglycemia (CMS HCC)  E11.65 metFORMIN (GLUCOPHAGE XR) 500 mg Oral Tablet Sustained Release 24 hr     Refer to CCM ([place]) Diabetes Education, ASSC-PRKBG     Refer to CCM ([place]) Dietician Services, MOB C     CREATININE WITH EGFR     95251 - INTERPRETATION/REPORT CONTINUOUS GLUCOSE MONITORING VIA SUBCUTANEOUS SENSOR (AMB ONLY)      2. Nutritional counseling  Z71.3 metFORMIN (GLUCOPHAGE XR) 500 mg Oral Tablet Sustained Release 24 hr     Refer to CCM [facility]) Diabetes Education, ASSC-PRKBG     Refer to CCM ([place]) Dietician Services, MOB C     CREATININE WITH EGFR     95251 - INTERPRETATION/REPORT CONTINUOUS GLUCOSE MONITORING VIA SUBCUTANEOUS SENSOR (AMB ONLY)      3. Exercise counseling  Z71.82 metFORMIN (GLUCOPHAGE XR) 500 mg Oral Tablet Sustained Release 24 hr     Refer to CCM [facility])  Diabetes Education, ASSC-PRKBG     Refer to CCM ([place]) Dietician Services, MOB C     CREATININE WITH EGFR     95251 - INTERPRETATION/REPORT CONTINUOUS GLUCOSE MONITORING VIA SUBCUTANEOUS  SENSOR (AMB ONLY)      4. Uses self-applied continuous glucose monitoring device  Z97.8 95251 - INTERPRETATION/REPORT CONTINUOUS GLUCOSE MONITORING VIA SUBCUTANEOUS SENSOR (AMB ONLY)         Uncontrolled Diabetes mellitus.  A1c 7.8%    Type 2 diabetes mellitus superimposed with hyperglycemia from HAART therapy.   Unlikely related to pancreatic insufficiency    Free Style Libre CGM  Average days of CGM 14 days  Average blood glucose 143 mg/dl  Glucose variability 23.2%     86% in range (70-180 mg/dl)    14% high (180 - 250 mg/dl)  0% very high (>250 mg/dl)    0% low (70-54 mg/dl)  0% very low (<54 mg/dl)       Plan  - metformin 1000 mg twice daily. Uptitrate by 500 mg to reach max dose of 200 mg   - GFR today   - recommended against sugary/diet/zero sugar beverages. Encouraged to drink water  - recommended against snacking in between meals  - advised to check glucose tid ac and hs       Osteoporosis    Risk factors: age, h/o prostate cancer, h/o HIV    Positive for atraumatic compression fractures.     DEXA Scan June 2023  T score of lumbar spine -2.8    T-score of total femoral hip -2  T-score of femoral neck -1.9    No images to review    Anabolic agents contraindicated.   Not ideal for prolia give h/o immunosuppression with prostate cancer and HIV    Plan  - iv reclast.  Dose 1 on 09/13/21  - recommended 1000-2000 units vitamin D per day  - DEXA scan in June 2025      Return in about 4 months (around 03/28/2022).    The above plan of care communicated to [facility]     On the day of the encounter, a total of  40 minutes was spent on this patient encounter including review of historical information, examination, documentation and post-visit activities.      Lin Landsman, MD    11/25/2021  09:48

## 2021-12-03 ENCOUNTER — Ambulatory Visit (INDEPENDENT_AMBULATORY_CARE_PROVIDER_SITE_OTHER): Payer: Self-pay | Admitting: Internal Medicine

## 2021-12-20 ENCOUNTER — Encounter (INDEPENDENT_AMBULATORY_CARE_PROVIDER_SITE_OTHER): Payer: Self-pay

## 2021-12-31 ENCOUNTER — Other Ambulatory Visit: Payer: Self-pay

## 2021-12-31 ENCOUNTER — Encounter (INDEPENDENT_AMBULATORY_CARE_PROVIDER_SITE_OTHER): Payer: Self-pay

## 2021-12-31 ENCOUNTER — Ambulatory Visit: Payer: 59 | Attending: Internal Medicine

## 2021-12-31 DIAGNOSIS — Z7182 Exercise counseling: Secondary | ICD-10-CM | POA: Insufficient documentation

## 2021-12-31 DIAGNOSIS — Z713 Dietary counseling and surveillance: Secondary | ICD-10-CM

## 2021-12-31 DIAGNOSIS — E1165 Type 2 diabetes mellitus with hyperglycemia: Secondary | ICD-10-CM | POA: Insufficient documentation

## 2021-12-31 NOTE — Progress Notes (Addendum)
DIABETES EDUCATION CENTER NOTE        Name: [name], 76 y.o., male  Race: Dema Severin  MRN: [id number]  DOB: November 24, 1945  Date of Service: 12/31/2021  Primary Care Physician: [place]    VISIT NOTES    Reason for Visit: Session 1 Session Type: Individual Diabetes Basics for diabetes self-management skills.    Group Session Not Available    Diagnosis:    ICD-10-CM    1. Uncontrolled type 2 diabetes mellitus with hyperglycemia (CMS HCC)  E11.65       2. Nutritional counseling  Z71.3       3. Exercise counseling  Z71.82          Barriers to Learning: None noted    HISTORY  Social History     Socioeconomic History    Marital status: Married   Tobacco Use    Smoking status: Never    Smokeless tobacco: Never   Vaping Use    Vaping Use: Never used   Substance and Sexual Activity    Alcohol use: Not Currently    Drug use: Never   Other Topics Concern    Ability to Walk 1 Flight of Steps without SOB/CP Yes    Routine Exercise Yes    Ability to Walk 2 Flight of Steps without SOB/CP Yes    Unable to Ambulate No    Total Care No    Ability To Do Own ADL's Yes    Uses Walker No    Other Activity Level Yes    Uses Cane No     Social Determinants of Health     Financial Resource Strain: Low Risk  (12/31/2021)    Financial Resource Strain     SDOH Financial: No   Transportation Needs: Low Risk  (12/31/2021)    Transportation Needs     SDOH Transportation: No   Social Connections: Low Risk  (12/31/2021)    Social Connections     SDOH Social Isolation: 5 or more times a week   Intimate Partner Violence: Low Risk  (12/31/2021)    Intimate Partner Violence     SDOH Domestic Violence: No   Housing Stability: Low Risk  (12/31/2021)    Housing Stability     SDOH Housing Situation: I have housing.     SDOH Housing Worry: No      Health Literacy: Low Risk  (12/31/2021)    Health Literacy     SDOH Health Literacy: Never   Employment Status: Low Risk  (12/31/2021)    Employment Status     SDOH Employment: Otherwise unemployed but not  seeking work (ex. Ship broker, retired, disabled, unpaid primary care giver)     Meds:  Metformin 1000 mg po twice daily.  Increase by 500 mg every week for max dose of 2000 mg    Allergies:  None    Vital Signs:  Vitals:    12/31/21 1016   BP: 112/60   Weight: 73 kg (161 lb)   Height: 1.778 m (_0 )   BMI: 23.15        Labs:  Diabetes Monitors  A1C: 7.8  A1C Date: 11/12/2021  Kidney Health:   Urine Microalbumin/Cr Ratio--16.9 Ur Microalb/Cr Ratio date--09/06/2021   eGFR --51 eGFR date--11/25/2021    Last Lipid Panel  (Last result in the past 2 years)        Cholesterol   HDL   LDL   Direct LDL   Triglycerides  09/06/21 0940 160   33   83  Comment: <100 mg/dL, Optimal  100-129 mg/dL, Near/Above Optimal  130-159 mg/dL, Borderline High  160-189 mg/dL, High  >=190 mg/dL, Very high     262            Last Eye Exam:  2023 Per patient  Last Foot Exam:  Has not had one     COMPREHENSIVE METABOLIC PANEL   Lab Results   Component Value Date    SODIUM 142 09/06/2021    POTASSIUM 3.9 09/06/2021    CHLORIDE 107 09/06/2021    CO2 25 09/06/2021    ANIONGAP 10 09/06/2021    BUN 18 09/06/2021    CREATININE 1.42 (H) 11/25/2021    GLUCOSENF 110 09/06/2021    CALCIUM 10.2 09/06/2021    PHOSPHORUS 3.2 06/26/2021    ALBUMIN 3.7 09/06/2021    TOTALPROTEIN 7.4 09/06/2021    ALKPHOS 122 (H) 09/06/2021    AST 18 09/06/2021    ALT 14 09/06/2021    BILIRUBINCON 0.1 06/24/2021     PATIENT REPORTS:      When were you diagnosed with diabetes?  February 20th 2023    Do you test your blood sugar?  Yes  How often do you test?  2-4   Ranges from 120-180 mg/dL    Number of hypoglycemic episodes in the past 2 weeks?  None    Do you take your medicine?  Yes How often do you miss taking your medication?  None    Do you follow a meal plan to manage diabetes?  No    Do you exercise?  Yes How often?  3 times per week  What type of exercise do you do?  Golf.  Walks on course for about 4-5 hours    Have you been to the dentist in the past 12 months?   Yes    Did you have a flu vaccine in the last year? Yes     Have you had a pneumonia vaccine?  Yes Is it up to date?  Yes    Do you have questions or concerns about diabetes that you would like to talk about privately?  No    Are you ready to make changes to manage your diabetes?  Yes    Psychosocial  Denies any psychosocial issues    Diabetes Needs Assessment for Education: Disease process, Nutritional management, Physical Activity, Medications, Monitoring, Preventing acute complications, Preventing chronic complications, Behavior change strategies, Risk reduction strategies, Psychosocial strategies    Education Goals:  Ensure patient has the necessary knowledge, skills and confidence to successfully manage diabetes throughout lifespan.    Patient Goals:  [name]'s goal is to keep 2 hour post prandial glucose below 180 mg/dL.      Goal measurement: 100%  Patient verbalized understanding of material discussed and reviewed.    Diabetes education completed.    Diabetes Self-Management Support Plan reviewed.   Individual plan: Diabetes Education, Diabetes Website, Dietician  Calorie [employer], Handouts provided:  What is Diabetes?, how to read labels, meal planning, glucose logs, hyper/hypoglycemia protocol, chronic complications and prevention, stress management, benefits of physical activity      Next scheduled appointment:   Dietician 01/14/2022 10:00 am    Start time: 10:00 am  Stop time: 11:00 am    Christella Noa, RN     The above note auto-routed to the referring provider.

## 2022-01-10 ENCOUNTER — Telehealth (HOSPITAL_BASED_OUTPATIENT_CLINIC_OR_DEPARTMENT_OTHER): Payer: Self-pay | Admitting: HEMATOLOGY/ONCOLOGY

## 2022-01-10 NOTE — Telephone Encounter (Signed)
Pt confirmed appt for hematology

## 2022-01-14 ENCOUNTER — Other Ambulatory Visit: Payer: Self-pay

## 2022-01-14 ENCOUNTER — Ambulatory Visit: Payer: 59 | Attending: Registered"

## 2022-01-14 DIAGNOSIS — E1165 Type 2 diabetes mellitus with hyperglycemia: Secondary | ICD-10-CM | POA: Insufficient documentation

## 2022-01-14 DIAGNOSIS — Z7182 Exercise counseling: Secondary | ICD-10-CM | POA: Insufficient documentation

## 2022-01-14 DIAGNOSIS — Z713 Dietary counseling and surveillance: Secondary | ICD-10-CM | POA: Insufficient documentation

## 2022-01-14 NOTE — Progress Notes (Signed)
DIETICIAN SERVICES, MEDICAL OFFICE BUILDING C  [address]  [place] [address]  Operated by [facility]     Name: [name] MRN:  [id number]   Date: 01/14/2022 Age: 76 y.o.      Assessment: Pt is here per Dr. Colonel Bald for 1st session due to uncontrolled T2DM with HbA1c 7.8%- 11/12/21. On 2,'000mg'$  metformin for 3rd week and he reports its causing nausea. 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 He reports having a hard time trying new foods. After initial diabetic diagnosis patient threw out all sweets and limits his sweet tea to once every 24hr.     Stage of Change: Contemplation    Fasting Blood Sugar- 81 mg/dL  B- Carnation Instant Breakfast Drink with whole milk   Blood Sugar- '150mg'$ /dL Glucose in office  L-  PB & J sandwich  D- Eats Out- [employer] or 1/2 rack spare ribs, potatoes, coleslaw  S- peanuts or apple  Beverages: Sweet (1x daily) or unsweet tea, 2 16oz. Water bottles/day  Golfs 4x weekly for 5hours. Goes to gym 3x weekly.     Vitals:    01/14/22 1053   Weight: 72.6 kg (160 lb)   Height: 1.778 m ('5\' 10"'$ )   BMI: 23.01      Labs:  Lab Results   Component Value Date    HA1C 9.3 (H) 03/17/2021      No results found for: "POCTHA1C"   CHOLESTEROL    Date Value Ref Range Status   09/06/2021 160 100 - 200 mg/dL Final     HDL CHOL   Date Value Ref Range Status   09/06/2021 33 (L) >=50 mg/dL Final     LDL CALC   Date Value Ref Range Status   09/06/2021 83 <100 mg/dL Final     Comment:     <100 mg/dL, Optimal  100-129 mg/dL, Near/Above Optimal  130-159 mg/dL, Borderline High  160-189 mg/dL, High  >=190 mg/dL, Very high     TRIGLYCERIDES   Date Value Ref Range Status   09/06/2021 262 (H) <150 mg/dL Final     VLDL CALC   Date Value Ref Range Status   09/06/2021 41 (H) <30 mg/dL Final      CHOL/HDL RATIO   Date Value Ref Range Status   09/06/2021 4.8  Final     NON-HDL   Date Value Ref Range Status   09/06/2021 127 <=190 mg/dL Final       Estimated Nutrient Needs:  IBW: 166lb (75kg)          Calories: 1875      kcal per day (  25  kcal/    75    kg   IBW    )    Nutrition Diagnosis Altered nutrition related lab values related to uncontrolled T2DM as evidenced by HbA1c 7.8%- 11/12/21.    Intervention:  Discussion time began with patient at  9:45am  and ended at  10:52am  for at total of   81mn.       Education focused on pairing protein and fiber at each meal via food balance formula. Discussed HbA1c and new goals he can strive for to improve overall health.     White Board Discussion:  Foods Containing- Protein, Fiber, Carbohydrates with Role on the Plate  Handouts with discussion:   Runner, broadcasting/film/video- carbohydrate counting, protein, fat, fiber  Build a Plate  Nutrition label / Metallurgist Nutrient Food List  Health Salad Recipe  Egg Bite Recipe with fruit  Spiced Lentil Soup Recipe- pair with chicken    Monitor/Evaluation:     Personal Goals:   1) Choose skim milk instead of whole milk.   2) Pair protein and fiber at each meal using food balance formula.   3) Try kodiak oatmeal with carnation instant breakfast with skim milk. Add dry milk powder if blood sugar is still too high.     Provided RD contact information.    F/u: meal build- example day, healthful recipe packet    [name], RD    Nursing Notes:   [name], [place]  01/14/22 1058  Signed     01/14/22 1000   Nutrition Management    State most important reason to use a meal plan 1 - Needs Instructions   State how meal spacing helps blood sugar control 2 - Needs Review/Assistance   State how composition of meals affect blood sugar 2 - Needs Review/Assistance   Describe how to keep a food diary 1 - Needs Instruction   State how monitoring amount of food can help reach blood sugar levels 2 - Needs Review/Assistance    Describe personal meal plan 1 - Needs Instruction   Use meal plan to plan meals 1 - Needs Instruction   Use meal plan when eating away from home 2 - Needs Review/Assistance   Identify behaviors to help control weight 1 - Needs Instruction   Explain how alcohol affects blood sugar 1 - Needs Instruction   Use food labels to choose food 2 - Needs Review/Assistance   Discuss benefits/cons of fiber 2 - Needs Review/Assistance   Plan eating/behavior changes working toward personal goals 2 - Needs Review/Assistance   Identify behaviors to help reduce risk of heart disease 2 - Needs Review/Assistance

## 2022-01-14 NOTE — Nursing Note (Signed)
01/14/22 1000   Nutrition Management    State most important reason to use a meal plan 1 - Needs Instructions   State how meal spacing helps blood sugar control 2 - Needs Review/Assistance   State how composition of meals affect blood sugar 2 - Needs Review/Assistance   Describe how to keep a food diary 1 - Needs Instruction   State how monitoring amount of food can help reach blood sugar levels 2 - Needs Review/Assistance   Describe personal meal plan 1 - Needs Instruction   Use meal plan to plan meals 1 - Needs Instruction   Use meal plan when eating away from home 2 - Needs Review/Assistance   Identify behaviors to help control weight 1 - Needs Instruction   Explain how alcohol affects blood sugar 1 - Needs Instruction   Use food labels to choose food 2 - Needs Review/Assistance   Discuss benefits/cons of fiber 2 - Needs Review/Assistance   Plan eating/behavior changes working toward personal goals 2 - Needs Review/Assistance   Identify behaviors to help reduce risk of heart disease 2 - Needs Review/Assistance

## 2022-02-02 ENCOUNTER — Other Ambulatory Visit: Payer: Self-pay

## 2022-02-02 ENCOUNTER — Ambulatory Visit: Payer: 59 | Attending: FAMILY MEDICINE

## 2022-02-02 DIAGNOSIS — B20 Human immunodeficiency virus [HIV] disease: Secondary | ICD-10-CM | POA: Insufficient documentation

## 2022-02-02 LAB — SYPHILIS SCREENING ALGORITHM WITH REFLEX, SERUM: SYPHILIS TP ANTIBODIES: NONREACTIVE

## 2022-02-02 LAB — CBC WITH DIFF
BASOPHIL #: 0.05 10*3/uL (ref 0.00–0.10)
BASOPHIL %: 0 % (ref 0–1)
EOSINOPHIL #: 0.45 10*3/uL — ABNORMAL HIGH (ref 0.00–0.40)
EOSINOPHIL %: 4 % (ref 0–6)
HCT: 35.2 % — ABNORMAL LOW (ref 40.7–52.3)
HGB: 10.2 g/dL — ABNORMAL LOW (ref 12.5–16.9)
LYMPHOCYTE #: 5.78 10*3/uL — ABNORMAL HIGH (ref 1.00–3.70)
LYMPHOCYTE %: 51 % — ABNORMAL HIGH (ref 14–45)
MCH: 22.7 pg — ABNORMAL LOW (ref 27.0–33.4)
MCHC: 29 g/dL — ABNORMAL LOW (ref 31.0–37.0)
MCV: 78.4 fL — ABNORMAL LOW (ref 84.1–99.3)
MONOCYTE #: 0.93 10*3/uL — ABNORMAL HIGH (ref 0.00–0.70)
MONOCYTE %: 8 % (ref 0–14)
NEUTROPHIL #: 4.07 10*3/uL (ref 1.50–7.00)
NEUTROPHIL %: 36 % — ABNORMAL LOW (ref 43–80)
PLATELETS: 374 10*3/uL (ref 129–381)
RBC: 4.49 10*6/uL (ref 4.09–5.89)
RDW: 19.8 % — ABNORMAL HIGH (ref 10.8–15.2)
WBC: 11.3 10*3/uL — ABNORMAL HIGH (ref 2.6–9.8)

## 2022-02-02 LAB — LIPID PANEL
CHOLESTEROL: 177 mg/dL (ref 0–199)
HDL CHOL: 36 mg/dL — ABNORMAL LOW (ref 40–60)
LDL CALC: 77 mg/dL (ref 0–130)
TRIGLYCERIDES: 318 mg/dL — ABNORMAL HIGH (ref 0–149)
VLDL CALC: 64 mg/dL

## 2022-02-02 LAB — COMPREHENSIVE METABOLIC PANEL, NON-FASTING
ALBUMIN: 4.4 g/dL (ref 3.5–5.0)
ALKALINE PHOSPHATASE: 74 U/L (ref 38–126)
ALT (SGPT): 17 U/L (ref <=50)
ANION GAP: 10 mmol/L
AST (SGOT): 22 U/L (ref 17–59)
BILIRUBIN TOTAL: 0.2 mg/dL (ref 0.2–1.3)
BUN/CREA RATIO: 12
BUN: 17 mg/dL (ref 9–20)
CALCIUM: 9.5 mg/dL (ref 8.4–10.2)
CHLORIDE: 103 mmol/L (ref 98–107)
CO2 TOTAL: 25 mmol/L (ref 22–30)
CREATININE: 1.37 mg/dL — ABNORMAL HIGH (ref 0.66–1.25)
ESTIMATED GFR: 53 mL/min/{1.73_m2} — ABNORMAL LOW (ref >=60)
GLUCOSE: 134 mg/dL — ABNORMAL HIGH (ref 74–106)
POTASSIUM: 4.6 mmol/L (ref 3.5–5.1)
PROTEIN TOTAL: 7.9 g/dL (ref 6.3–8.2)
SODIUM: 138 mmol/L (ref 137–145)

## 2022-02-02 LAB — PT/INR
INR: 1.01 (ref <=5.00)
PROTHROMBIN TIME: 10.4 seconds (ref 9.4–11.9)

## 2022-02-02 LAB — PTT (PARTIAL THROMBOPLASTIN TIME): APTT: 24.1 seconds (ref 22.1–31.9)

## 2022-02-02 NOTE — Ancillary Notes (Signed)
[place]    Venipuncture performed in office on left arm antecubital vein, dry pressure dressing was applied to site and patient tolerated it well.  Specimen was centrifuged, aliquoted as needed and specimen was labeled and packaged for transport.    Leeann Must  02/02/2022, 13:53

## 2022-02-02 NOTE — Ancillary Notes (Signed)
[facility] MOV    Unsuccessful venipuncture performed in office on right arm antecubital vein, dry pressure dressing was applied to site and patient tolerated it well.        Leeann Must  02/02/2022, 14:07

## 2022-02-04 ENCOUNTER — Encounter (INDEPENDENT_AMBULATORY_CARE_PROVIDER_SITE_OTHER): Payer: Self-pay

## 2022-02-04 ENCOUNTER — Other Ambulatory Visit (INDEPENDENT_AMBULATORY_CARE_PROVIDER_SITE_OTHER): Payer: Self-pay | Admitting: Internal Medicine

## 2022-02-04 DIAGNOSIS — E1165 Type 2 diabetes mellitus with hyperglycemia: Secondary | ICD-10-CM

## 2022-02-04 LAB — CD4/CD8
CD3 # (T CELL): 4049 cells/uL — ABNORMAL HIGH (ref 892–2436)
CD3 % (T CELL): 70 %
CD4 # (HELPER T CELL): 1130 cells/uL (ref 382–1614)
CD4 % (HELPER T CELL): 20 %
CD4:CD8 RATIO: 0.4 — ABNORMAL LOW (ref 1.0–3.4)
CD8 # (SUPPRESSOR T CELL): 2922 cells/uL — ABNORMAL HIGH (ref 157–813)
CD8 % (SUPPRESSOR T CELL): 51 %

## 2022-02-04 LAB — QUANTIFERON TB NIL: QFT NIL: 0.0072 IU/mL

## 2022-02-04 LAB — QUANTIFERON
QFT MITOGEN: >10 IU/mL
QFT NIL: 0.0072 IU/mL
QFT QUALITATIVE: NEGATIVE
QFT TB1: 0.01 IU/mL
QFT TB2: 0.01 IU/mL

## 2022-02-04 LAB — QUANTIFERON TB2: QFT TB2: 0.01 IU/mL

## 2022-02-04 LAB — QUANTIFERON TB MITOGEN: QFT MITOGEN: >10 IU/mL

## 2022-02-04 LAB — QUANTIFERON TB1: QFT TB1: 0.01 IU/mL

## 2022-02-04 NOTE — Telephone Encounter (Unsigned)
Last scheduled appointment with you was 11/25/2021.  Currently scheduled future appointment is 03/28/2022.      Bill Salinas, RN  02/04/2022, 08:40

## 2022-02-07 MED ORDER — GLIPIZIDE 5 MG TABLET
ORAL_TABLET | ORAL | 0 refills | Status: DC
Start: 2022-02-07 — End: 2022-02-23

## 2022-02-08 ENCOUNTER — Encounter (INDEPENDENT_AMBULATORY_CARE_PROVIDER_SITE_OTHER): Payer: Self-pay | Admitting: PHYSICIAN ASSISTANT

## 2022-02-08 ENCOUNTER — Ambulatory Visit (INDEPENDENT_AMBULATORY_CARE_PROVIDER_SITE_OTHER): Payer: 59 | Admitting: PHYSICIAN ASSISTANT

## 2022-02-08 ENCOUNTER — Other Ambulatory Visit: Payer: Self-pay

## 2022-02-08 VITALS — BP 114/70 | HR 80 | Ht 70.0 in | Wt 161.0 lb

## 2022-02-08 DIAGNOSIS — B20 Human immunodeficiency virus [HIV] disease: Secondary | ICD-10-CM

## 2022-02-08 DIAGNOSIS — Z21 Asymptomatic human immunodeficiency virus [HIV] infection status: Secondary | ICD-10-CM

## 2022-02-08 MED ORDER — GENVOYA 150 MG-150 MG-200 MG-10 MG TABLET
1.0000 | ORAL_TABLET | Freq: Every day | ORAL | 1 refills | Status: DC
Start: 2022-02-08 — End: 2022-08-08

## 2022-02-08 NOTE — Progress Notes (Addendum)
Return Patient Visit         CHIEF COMPLAINT: HIV Follow-up       HPI: HIV infection History of MRSA bacteremia secondary skin and soft tissue infection. Patient is on Genvoya, stable HIV VL and CD4 count. 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     REVIEW OF SYSTEMS:  Negative except as noted in HPI    MEDICATIONS:  Current Outpatient Medications   Medication Sig    acetaminophen (TYLENOL) 325 mg Oral Tablet Take 2 Tablets (650 mg total) by mouth Every 6 hours as needed for Pain    amLODIPine (NORVASC) 10 mg Oral Tablet Take 1 Tablet (10 mg total) by mouth Once a day    apixaban (ELIQUIS) 2.5 mg Oral Tablet Take 1 Tablet (2.5 mg total) by mouth Twice daily    atorvastatin (LIPITOR) 80 mg Oral Tablet Take 0.5 Tablets (40 mg total) by mouth Every morning with breakfast    cholecalciferol, vitamin D3, 25 mcg (1,000 unit) Oral Tablet Take 1 Tablet (1,000 Units total) by mouth Once a day    cyanocobalamin (VITAMIN B 12) 1,000 mcg Oral Tablet Take 1 Tablet (1,000 mcg total) by mouth Every morning    docusate sodium (COLACE) 100 mg Oral Capsule Take 1 Capsule (100 mg total) by mouth    elviteg-cob-emtri-tenof ALAFEN (GENVOYA) 150-150-200-10 mg Oral Tablet Take 1 Tablet by mouth Once a day    ferrous sulfate (FEOSOL) 325 mg (65 mg iron) Oral Tablet Take 1 Tablet (325 mg total) by mouth Once a day    flash glucose sensor (FREESTYLE LIBRE 2 SENSOR) Does not apply Kit To check blood glucose continuously. Change every 14 days    gabapentin (NEURONTIN) 100 mg Oral Capsule Take 1 Capsule (100 mg total) by mouth Three times a day    glipiZIDE (GLUCOTROL) 5 mg Oral Tablet Take 1 tablet twice daily    levothyroxine (SYNTHROID) 88 mcg Oral Tablet Take 1 Tablet (88 mcg total) by mouth Every morning    melatonin 3 mg Oral Tablet Take 2 Tablets (6 mg total) by mouth Every night    Mirtazapine (REMERON) 7.5 mg Oral Tablet Take 1 Tablet (7.5 mg total) by mouth Every night    MULTIVITAMIN ORAL Take 1 Tablet  by mouth Once a day    oxyCODONE (ROXICODONE) 5 mg Oral Tablet Take 1 Tablet (5 mg total) by mouth Every 4 hours as needed for Pain    polyethylene glycol (MIRALAX) 17 gram/dose Oral Powder Take 3 teaspoons (17 g total) by mouth Once a day (Patient not taking: Reported on 02/08/2022)    sennosides-docusate sodium (SENOKOT-S) 8.6-50 mg Oral Tablet Take 1 Tablet by mouth Twice daily       ANTIRETROVIRAL REGIMEN:  Genvoya    PHYSICAL EXAM:  General: In no acute distress.  Does not look acutely or chronically ill  Eyes: No scleral icterus or conjunctival injection  Mental status: Alert and oriented x 3.Cooperative with normal cognition.  Cardiovascular: No cyanosis. No edema of LE  Respiratory: Normal respiratory effort. No accessory muscle use.   Skin: No jaundice or rash.     VITALS:  Vitals:    02/08/22 1027   BP: 114/70   Pulse: 80   SpO2: 99%   Weight: 73 kg (161 lb)   Height: 1.778 m ('5\' 10"'$ )   BMI: 23.15        CD4 # (HELPER T CELL) (cells/uL)   Date Value   02/02/2022 1,130  07/29/2021 1,150     CD4 % (HELPER T CELL) (%)   Date Value   02/02/2022 20   07/29/2021 18     HIV1 RNA VIRAL LOAD (no units)   Date Value   02/02/2022 Target Not Detected   07/29/2021 Target Not Detected       Immune Status Pos/Neg/Date   HEPATITIS C ANTIBODY HCV ANTIBODY QUALITATIVE   Date Value Ref Range Status   03/30/2021 Negative Negative Final     Comment:     Hepatitis Serology Methods = Chemiluminescent immunoassay performed on [employer] equipment          SCREENINGS RESULT   LIPID PANEL TRIGLYCERIDES   Date Value Ref Range Status   02/02/2022 318 (H) 0 - 149 mg/dL Final     CHOLESTEROL   Date Value Ref Range Status   02/02/2022 177 0 - 199 mg/dL Final     HDL CHOL   Date Value Ref Range Status   02/02/2022 36 (L) 40 - 60 mg/dL Final     LDL CALC   Date Value Ref Range Status   02/02/2022 77 0 - 130 mg/dL Final       RPR (ANNUAL) 02/02/22 Negative   QUANTIFERON (ANNUAL) 02/02/22 Negative       Health Maintenance Due   Topic Date Due     Diabetic Retinal Exam  Never done    Osteoporosis screening  Never done    Adult Tdap-Td (1 - Tdap) Never done    Medicare Annual Wellness Visit  Never done    Shingles Vaccine (2 of 2) 04/24/2018    Meningococcal B Vaccine (2 of 4 - Increased Risk Bexsero 2-dose series) 04/01/2021    Meningococcal Vaccine (2 - Risk 2-dose series) 04/29/2021    Covid-19 Vaccine (3 - Moderna risk series) 12/07/2021    Diabetic A1C  02/12/2022        Immunization History   Administered Date(s) Administered    Covid-19 Vaccine,Moderna Bivalent 01/07/2021    Covid-19 Vaccine,Moderna(Spikevax),15mg/0.5mL(2023-2024),12 yr+ 11/09/2021    HAEMOPHILUS B CONJUGATE VACCINE 03/04/2021    HEPATITIS B VIRUS RECOMB VACCINE 10/11/2013, 11/18/2013, 02/18/2014    High-Dose Influenza Vaccine, 65+ 11/06/2018    Influenza Vaccine, 6 month-adult 11/18/2013, 10/27/2014, 10/16/2015, 10/17/2016, 11/03/2017    Meningococcal B Vaccine - 2 Dose (Bexsero) 03/04/2021    Meningococcal Vaccine (Menveo) (Admin) 03/04/2021    PREVNAR 13 12/14/2012, 01/14/2014    Shingrix - Zoster Vaccine 02/27/2018    VAXNEUVANCE(PCV 15) 03/04/2021       HIV:      ANTIRETROVIRAL REGIMEN:  Genvoya     ADHERENCE:     stable, continue RX,    ADHERENCE COUNSELING:  YES    Antimicrobial prophylaxis:  N/A    Sexual Activity: Monogamous, wife.         ASSESSMENT/PLAN    ICD-10-CM    1. Human immunodeficiency virus (HIV) disease (CMS HCC)  B20 elviteg-cob-emtri-tenof ALAFEN (GENVOYA) 150-150-200-10 mg Oral Tablet     CBC/DIFF     COMPREHENSIVE METABOLIC PANEL, NON-FASTING     HIV-1 RNA QUANTITATIVE PCR, PLASMA     CD4/CD8          HIV  History of MRSA bacteremia secondary skin and soft tissue infection.   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 Denies recent outbreaks in genitals.     Patient stable on Genvoya. Tolerating well. Denies missed doses.   Encouraged to increase hydration. Creatinine improved from previous labs.  Vaccines are up to date  35  with [facility] for primary care         PATIENT INSTRUCTIONS:  There are no Patient Instructions on file for this visit.    FOLLOW UP  Return in about 6 months (around 08/09/2022) for follow-up HIV.    Melonie [place], PA

## 2022-02-10 LAB — HIV-1 RNA QUANTITATIVE PCR, PLASMA: HIV1 RNA VIRAL LOAD: NOT DETECTED

## 2022-02-15 ENCOUNTER — Ambulatory Visit: Payer: 59 | Attending: HEMATOLOGY/ONCOLOGY | Admitting: HEMATOLOGY/ONCOLOGY

## 2022-02-15 ENCOUNTER — Encounter (HOSPITAL_BASED_OUTPATIENT_CLINIC_OR_DEPARTMENT_OTHER): Payer: Self-pay | Admitting: Internal Medicine

## 2022-02-15 ENCOUNTER — Other Ambulatory Visit: Payer: Self-pay

## 2022-02-15 ENCOUNTER — Encounter (HOSPITAL_BASED_OUTPATIENT_CLINIC_OR_DEPARTMENT_OTHER): Payer: Self-pay | Admitting: HEMATOLOGY/ONCOLOGY

## 2022-02-15 ENCOUNTER — Ambulatory Visit (HOSPITAL_BASED_OUTPATIENT_CLINIC_OR_DEPARTMENT_OTHER)
Admission: RE | Admit: 2022-02-15 | Discharge: 2022-02-15 | Disposition: A | Discharge status: Home / Self Care | Payer: 59 | Source: Ambulatory Visit | Attending: HEMATOLOGY/ONCOLOGY | Admitting: HEMATOLOGY/ONCOLOGY

## 2022-02-15 VITALS — BP 126/73 | HR 86 | Temp 97.9°F | Resp 16 | Ht 70.0 in | Wt 162.2 lb

## 2022-02-15 DIAGNOSIS — D500 Iron deficiency anemia secondary to blood loss (chronic): Secondary | ICD-10-CM | POA: Insufficient documentation

## 2022-02-15 DIAGNOSIS — Z809 Family history of malignant neoplasm, unspecified: Secondary | ICD-10-CM | POA: Insufficient documentation

## 2022-02-15 DIAGNOSIS — Z86718 Personal history of other venous thrombosis and embolism: Secondary | ICD-10-CM | POA: Insufficient documentation

## 2022-02-15 DIAGNOSIS — Z79899 Other long term (current) drug therapy: Secondary | ICD-10-CM | POA: Insufficient documentation

## 2022-02-15 DIAGNOSIS — Z8546 Personal history of malignant neoplasm of prostate: Secondary | ICD-10-CM | POA: Insufficient documentation

## 2022-02-15 DIAGNOSIS — M810 Age-related osteoporosis without current pathological fracture: Secondary | ICD-10-CM | POA: Insufficient documentation

## 2022-02-15 DIAGNOSIS — Z923 Personal history of irradiation: Secondary | ICD-10-CM | POA: Insufficient documentation

## 2022-02-15 DIAGNOSIS — D509 Iron deficiency anemia, unspecified: Secondary | ICD-10-CM

## 2022-02-15 DIAGNOSIS — Z7901 Long term (current) use of anticoagulants: Secondary | ICD-10-CM | POA: Insufficient documentation

## 2022-02-15 DIAGNOSIS — Z86711 Personal history of pulmonary embolism: Secondary | ICD-10-CM | POA: Insufficient documentation

## 2022-02-15 LAB — COMPREHENSIVE METABOLIC PANEL, NON-FASTING
ALBUMIN: 4.5 g/dL (ref 3.4–4.8)
ALKALINE PHOSPHATASE: 85 U/L (ref 45–115)
ALT (SGPT): 15 U/L (ref 10–55)
ANION GAP: 12 mmol/L (ref 4–13)
AST (SGOT): 16 U/L (ref 8–45)
BILIRUBIN TOTAL: 0.4 mg/dL (ref 0.3–1.3)
BUN/CREA RATIO: 15 (ref 6–22)
BUN: 23 mg/dL (ref 8–25)
CALCIUM: 9.8 mg/dL (ref 8.6–10.3)
CHLORIDE: 105 mmol/L (ref 96–111)
CO2 TOTAL: 23 mmol/L (ref 23–31)
CREATININE: 1.49 mg/dL — ABNORMAL HIGH (ref 0.75–1.35)
ESTIMATED GFR - MALE: 48 mL/min/BSA — ABNORMAL LOW (ref >=60)
GLUCOSE: 104 mg/dL (ref 65–125)
POTASSIUM: 4.5 mmol/L (ref 3.5–5.1)
PROTEIN TOTAL: 8.1 g/dL — ABNORMAL HIGH (ref 6.0–8.0)
SODIUM: 140 mmol/L (ref 136–145)

## 2022-02-15 LAB — MANUAL DIFF AND MORPHOLOGY-SYSMEX
BASOPHIL #: <0.1 10*3/uL (ref <=0.20)
BASOPHIL %: 0 %
EOSINOPHIL #: 0.48 10*3/uL (ref <=0.50)
EOSINOPHIL %: 3 %
LYMPHOCYTE #: 9.44 10*3/uL — ABNORMAL HIGH (ref 1.00–4.80)
LYMPHOCYTE %: 59 %
MONOCYTE #: 1.92 10*3/uL — ABNORMAL HIGH (ref 0.20–1.10)
MONOCYTE %: 12 %
NEUTROPHIL #: 4.16 10*3/uL (ref 1.50–7.70)
NEUTROPHIL %: 26 %

## 2022-02-15 LAB — CBC WITH DIFF
HCT: 36.2 % — ABNORMAL LOW (ref 38.9–52.0)
HGB: 10.9 g/dL — ABNORMAL LOW (ref 13.4–17.5)
MCH: 23.6 pg — ABNORMAL LOW (ref 26.0–32.0)
MCHC: 30.1 g/dL — ABNORMAL LOW (ref 31.0–35.5)
MCV: 78.5 fL (ref 78.0–100.0)
MPV: 10 fL (ref 8.7–12.5)
PLATELETS: 371 10*3/uL (ref 150–400)
RBC: 4.61 10*6/uL (ref 4.50–6.10)
RDW-CV: 19.1 % — ABNORMAL HIGH (ref 11.5–15.5)
WBC: 16 10*3/uL — ABNORMAL HIGH (ref 3.7–11.0)

## 2022-02-15 LAB — FERRITIN: FERRITIN: 13 ng/mL — ABNORMAL LOW (ref 20–300)

## 2022-02-15 MED ORDER — FERROUS SULFATE 325 MG (65 MG IRON) TABLET
325.0000 mg | ORAL_TABLET | Freq: Every day | ORAL | 3 refills | Status: DC
Start: 2022-02-15 — End: 2022-02-23

## 2022-02-15 NOTE — Nursing Note (Signed)
Ok to stop Eliquis  Refer to GI for iron def anemia  CBC, CMP and Ferritin today  RTC in 8 weeks with repeat CBC, CMP and Ferritin  Prescription iron sent to [facility] on [address]

## 2022-02-15 NOTE — Cancer Center Note (Signed)
[facility]  [address]   [place], [place] [address]   [place]  Date of Birth: 01-Aug-1945  MRN: [id number]  Date of Service:  02/15/2022  Referring Provider: No ref. provider found     Chief Complaint: Other (anemia)     HPI:  [name] is a 77 y.o. who presents to the [facility] at [facility] for evaluation and opinion regarding the venous thrombosis of right lower extremity.     He has significant past medical history of prostate cancer which has been treated with radiation therapy in 2017 and since then his prostate cancer is controlled as per patient with less than 0.1 of PSA.  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 He completed 3 months of anticoagulant therapy and then stopped the Eliquis after 3 months. He then had another surgery and developed PE's and was put on blood thinners again. He had a filter at some time, but the filter has since been removed. He is following at [facility] for management of his Eliquis. He was told to remain on a prophylaxis dose long term.       He has now been referred to [place] for anemia. He is gaining weight back after being in the hospital. Patient states he was dropped while being transferred form the bed to the CT table while in the hospital and fractured his spine. He is planning to let his spine heal on its own and not do further surgery. He has been started on Reclast for his osteoporosis.     12/30/2021 outside labs reviewed  Ferritin was 9.1 and Hgb was 10    Oncology History  Oncology History    No history exists.       Past Medical History  Past Medical History:   Diagnosis Date    Cancer (CMS [facility])      prostate    Deep vein thrombosis (DVT) (CMS [facility])     right leg    Esophageal reflux     H/O hearing loss     High cholesterol     History of kidney disease     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    HTN (hypertension)     Human immunodeficiency virus (HIV) disease (CMS [facility])     Hyperlipidemia     "borderline"    Hypothyroidism     MRSA (methicillin resistant staph aureus) culture positive 01/02/2021    MRSA left groin abscess 01/03/21    MRSA (methicillin resistant staph aureus) culture positive 01/02/2021    MRSA blood 01/02/21    Pulmonary embolism (CMS [facility]) 03/31/2021    Thyroid disorder     Wears glasses          Past Surgical History:   Procedure Laterality Date    COLON SURGERY      per patient had 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 Parke Simmers  HX CERVICAL SPINE SURGERY  2001    HX COLOSTOMY REVERSAL      HX HERNIA REPAIR      KNEE SURGERY Bilateral     WRIST SURGERY Right          Current Outpatient Medications   Medication Sig Dispense Refill    acetaminophen (TYLENOL) 325 mg Oral Tablet Take 2 Tablets (650 mg total) by mouth Every 6 hours as needed for Pain      amLODIPine (NORVASC) 10 mg Oral Tablet Take 1 Tablet (10 mg total) by mouth Once a day      apixaban (ELIQUIS) 2.5 mg Oral Tablet Take 1 Tablet (2.5 mg total) by mouth Twice daily      atorvastatin (LIPITOR) 80 mg Oral Tablet Take 0.5 Tablets (40 mg total) by mouth Every morning with breakfast      cholecalciferol, vitamin D3, 25 mcg (1,000 unit) Oral Tablet Take 1 Tablet (1,000 Units total) by mouth Once a day 90 Tablet 0    cyanocobalamin (VITAMIN B 12) 1,000 mcg Oral Tablet Take 1 Tablet (1,000 mcg total) by mouth Every morning      docusate sodium (COLACE) 100 mg Oral Capsule Take 1 Capsule (100 mg total) by mouth      elviteg-cob-emtri-tenof ALAFEN (GENVOYA) 150-150-200-10 mg Oral Tablet  Take 1 Tablet by mouth Once a day 90 Tablet 1    flash glucose sensor (FREESTYLE LIBRE 2 SENSOR) Does not apply Kit To check blood glucose continuously. Change every 14 days 6 Each 3    gabapentin (NEURONTIN) 100 mg Oral Capsule Take 1 Capsule (100 mg total) by mouth Three times a day      glipiZIDE (GLUCOTROL) 5 mg Oral Tablet Take 1 tablet twice daily 180 Tablet 0    levothyroxine (SYNTHROID) 88 mcg Oral Tablet Take 1 Tablet (88 mcg total) by mouth Every morning      melatonin 3 mg Oral Tablet Take 2 Tablets (6 mg total) by mouth Every night      Mirtazapine (REMERON) 7.5 mg Oral Tablet Take 1 Tablet (7.5 mg total) by mouth Every night      MULTIVITAMIN ORAL Take 1 Tablet by mouth Once a day      oxyCODONE (ROXICODONE) 5 mg Oral Tablet Take 1 Tablet (5 mg total) by mouth Every 4 hours as needed for Pain      polyethylene glycol (MIRALAX) 17 gram/dose Oral Powder Take 3 teaspoons (17 g total) by mouth Once a day (Patient not taking: Reported on 02/08/2022)      sennosides-docusate sodium (SENOKOT-S) 8.6-50 mg Oral Tablet Take 1 Tablet by mouth Twice daily       No current facility-administered medications for this visit.     No Known Allergies  Social History     Tobacco Use    Smoking status: Never    Smokeless tobacco: Never   Vaping Use    Vaping Use: Never used   Substance Use Topics    Alcohol use: Not Currently    Drug use: Never      Family Medical History:       Problem Relation (Age of Onset)    Cancer Mother, Father, Other    Heart Attack Mother    High Cholesterol Other    Stroke Other            Review of Systems  As above  Physical Examination: BP 126/73   Pulse 86   Temp 36.6 C (97.9 F)   Resp  16   Ht 1.778 m ('5\' 10"'$ )   Wt 73.6 kg (162 lb 3.2 oz)   SpO2 97%   BMI 23.27 kg/m    Performance Status:  0  General appearance: well appearing, alert, in no acute distress.  Skin: skin color, texture, tugor normal, no rashes or lesions  Head: nomocephalic, no masses, lesions, tendernesss or  abdomalities  Eyes: Anicteric sclera.  Extraocular movements are intact  Ears: external ears normal  Oropharynx: Oropharynx clear, no erthema, no thrush.  Neck: Supple,thyroid not palpable  OFB:PZWCH, oriented and grossly intact    LABS  Results for orders placed or performed during the hospital encounter of 02/15/22 (from the past 72 hour(s))   BLOOD CELL COUNT W/DIFF - CANCER CENTER    Narrative    The following orders were created for panel order BLOOD CELL COUNT W/DIFF - CANCER CENTER.  Procedure                               Abnormality         Status                     ---------                               -----------         ------                     CBC WITH ENID[[id number]]                                                                 Please view results for these tests on the individual orders.     Outside lab CBC is within normal limits creatinine is 1.22 LFTs are within normal limit LDH is slightly elevated.   Radiology  No results were found from the past 30 days.    Last Mammogram (if applicable): No results found for this or any previous visit (from the past 17520 hour(s)).    Last PetScan (if applicable): No results found for this or any previous visit (from the past 17520 hour(s)).    Recent Pathology  Last Pathology/Cytology Result   SURGICAL PATHOLOGY SPECIMEN (Collected: 03/22/2021  4:36 PM)   Result Value Ref Range    Final Diagnosis       A. PRIOR MESH, REMOVAL:  - Benign fibroadipose tissue.    B. DISTAL PANCREAS AND SPLEEN, DISTAL PANCREATECTOMY AND SPLENECTOMY:  - Intraductal papillary mucinous neoplasm (IPMN), low grade, gastric phenotype, side branch type, measuring 1.7cm and extending to the pancreatic parenchymal margin.   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  - No high grade dysplasia or invasion is seen (cyst entirely submitted for histologic examination).  - Unremarkable spleen.  - Multiple reactive lymph nodes.    C. PERITONEAL NODULE, RESECTION:  -  Adipose tissue with foreign body type material.      Diagnosis Comment       Part B: PAS+ and PASD- globules are seen with the serous cystadenoma component. Inhibin, ER and PR immunostains are negative for ovarian-type stroma.  Part C:  AE1-AE3 immunostain is negative for infiltrating cells.  [place] red and Mucicarmine special stains are negative.  Trichrome stain shows extensive fibrosis.      The case has been reviewed at the cytology consensus conference. Select slides were reviewed with Dr Lance Morin.        Microscopic Description       Performed.      Gross Description       A: Other (Specify Site in comments)  Part A is received in formalin, labeled "[name], [name]" and "Prior mesh". It consists of a 9.3 x 3.3 x 2.2 cm segment of purple-tan, firm synthetic material, consistent with a mesh.  There is a 4.5 x 2.3 x 1.0 cm aggregate of loosely attached yellow-tan, lobulated adipose tissue.  Sectioning reveals yellow-tan, soft, lobulated and glistening cut surfaces.  Representative sections of the adipose tissue are submitted in cassette A1.  B: Pancreas  Part B is received in formalin, labeled "[name], [name]" and "Distal pancreas and spleen ". It consists of a distal pancreatectomy and splenectomy specimen to include a 11.1 x 6.7 x 5.6 cm pancreas with a stapled pancreatic margin, a 108 g, 11.7 x 7.1 x 3.7 cm spleen with a 10.5 cm in length by 0.6 cm in diameter segment of splenic vasculature with stapled margins, and a 9.5 x 6.7 x 2.8 cm portion of perisplenic and peripancreatic adipose tissue.  The outer surface of the spleen is yellow-tan and cystic.  The proximal pancreatic margin is inked orange, the anterior pancreatic surface is inked blue, and the posterior pancreatic surface is inked black.  The main pancreatic duct is partially probe patent, and the pancreas is bivalved to reveal a 7.1 x 6.0 x 4.4 cm multiloculated cyst filled with clear, mucinous fluid that involves the main pancreatic duct,  extends to the anterior and posterior surface, and is 3.2 cm from the proximal pancreatic margin.  The remainder of the pancreatic parenchyma is yellow-tan, rubbery, lobulated and glistening.  The splenic capsule is purple-tan, smooth and glistening and is intact.  The spleen is sectioned to reveal a 2.2 x 1.3 x 0.5 cm subcapsular area of hemorrhage.  The remainder of the cut surface is red-brown, rubbery, smooth and glistening with focal areas of white pulp.  Sectioning through the fat reveals a 0.6 x 0.2 x 0.2 cm white-tan to yellow-tan, partially calcified nodule.  Eighteen possible lymph nodes are identified and range from 0.2-0.8 cm in greatest dimension.  Representative sections, to include entire main pancreatic duct and approximately 30% of the cyst, are submitted as follows:  B1:  Proximal pancreatic margin, en face  B2:  Splenic vascular margins, en face  B3-B7:  Entirety of main pancreatic duct, sequentially submitted  B8-B9:  Cyst to anterior surface  B10-B11:  Cyst to posterior surface  B12-B16:  Cyst to uninvolved  B17-B20:  Cyst  B21:  Uninvolved pancreas  B22:  Splenic area of hemorrhage  B23:  Uninvolved spleen  B24:  Hardened nodule  B25-B27:  6 whole lymph nodes in each cassette  C: Peritoneum  Part C is received in formalin, labeled "[name], [name]" and "Peritoneum nodule". It consists of a 3.2 x 1.5 x 0.3 cm portion of yellow-tan, lobulated adipose tissue.  Sectioning reveals a 1.7 x 0.6 x 0.3 cm white-tan, markedly hardened nodule.  The specimen is entirely submitted as follows:  C1:  Entirety of nodule, following decalcification  C2: Remainder of adipose tissue     Last Pathology/Cytology Result  CYTOPATHOLOGY, FINE NEEDLE ASPIRATE (Collected: 02/08/2021 12:58 PM)   Result Value Ref Range    Interpretation       A. Pancreatic cyst, tail, endoscopic ultrasound-guided fine needle aspiration, cytology with cell block:  Satisfactory for evaluation.  NEOPLASTIC: OTHER  Mucinous cyst fluid with  low-grade atypia, see comment.         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 Smears are essentially acellular with scant possible wispy mucin. Cell block shows few strips of mucinous epithelium with low-grade atypia. Cyst fluid CEA and amylase are not available. PancreaSeq is pending. Case reviewed at Cytology Consensus Conference.           Gross Description       A: Pancreatic Cyst  Specimen consist of  <0.5 mL of cloudy, red fluid without fixative.   A cell block and 2 smears were prepared.               Assessment and Plan    ICD-10-CM    1. Iron deficiency anemia  D50.9 BLOOD CELL COUNT W/DIFF - CANCER CENTER     COMPREHENSIVE METABOLIC PANEL, NON-FASTING     FERRITIN     BLOOD CELL COUNT W/DIFF - CANCER CENTER     COMPREHENSIVE METABOLIC PANEL, NON-FASTING     FERRITIN     Refer to [facility]) Gastroenterology-MOB B-PRKBG          77 y.o. male with history of right-sided tibial vein thrombosis and PEs both of which were provoked due to surgery.  Discussed with patient about stopping Eliquis as both clots were provoked. He would really like to stop Eliquis as he does have easy bleeding. Patient will stop Eliquis at this time. He is aware that if he were to have an unprovoked clot, he would need to be on Eliquis life-long.    For patient's anemia, will prescribe ferrous sulfate 1 tablet daily for 8 weeks. Patient will then follow up. If there is not improvement in his ferritin levels and hemoglobin, will consider IV iron infusions. Will also send him to GI for evaluation as he has not had a colonoscopy in several years.     Will get CBC, CMP, and Ferritin today and then a CBC and ferritin at f/u.     Return visit as scheduled.    I am scribing for, and in the presence of, Dr. Milinda Cave for services provided on 02/15/2022.  Michiel Cowboy, SCRIBE   [place], [place]    I personally performed the services described in this documentation,  as scribed  in my presence, and it is both accurate  and complete.    Milinda Cave, MD    This note was partially generated using MModal Fluency Direct system, and there may be some incorrect words, spellings, and punctuation that were not noted in checking the note before saving.

## 2022-02-16 ENCOUNTER — Encounter (HOSPITAL_BASED_OUTPATIENT_CLINIC_OR_DEPARTMENT_OTHER): Payer: Self-pay | Admitting: Internal Medicine

## 2022-02-21 ENCOUNTER — Ambulatory Visit: Payer: Medicare Other | Attending: Internal Medicine

## 2022-02-21 ENCOUNTER — Other Ambulatory Visit: Payer: Self-pay

## 2022-02-21 DIAGNOSIS — E119 Type 2 diabetes mellitus without complications: Secondary | ICD-10-CM | POA: Insufficient documentation

## 2022-02-21 NOTE — Progress Notes (Signed)
[facility], MEDICAL OFFICE BUILDING [facility]  [address]  [place] [address]  Operated by [facility]     Name: [name] MRN:  [id number]   Date: 02/21/2022 Age: 77 y.o.      Assessment: Pt is here per Dr. Colonel Bald for 2nd session due to uncontrolled T2DM with HbA1c 7.8%- 11/12/21. Metformin causing nausea. 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 He reports having a hard time trying new foods. After initial diabetic diagnosis patient threw out all sweets and limits his sweet tea to once every 24hr.     Stage of Change: Action  Current Visit: Patient has made dietary changes since prior visit. He reports being happy with his current dietary regimen.     Fasting Blood Sugar- 81 mg/dL  B- Carnation Instant Breakfast Drink with 2% milk Or kodiak oatmeal  with sugar-free creamer in coffee or regular flavored oatmeal  Blood Sugar- '177mg'$ /dL Glucose in office  L-  PB & J sandwich  D- Eats Out- 1x weekly [employer] or 1/2 rack spare ribs, potatoes, coleslaw, [employer]- fish entree or pot roast, potatoes, and carrot dish on weekends  S- peanuts or apple  Beverages: unsweet tea, 2 16oz. Water bottles/day, sugar-free under armor  Golfs 4x weekly for 5hours. Goes to gym 3x weekly.     Vitals:    02/21/22 0919   Weight: 72.5 kg (159 lb 12.8 oz)   Height: 1.778 m ('5\' 10"'$ )   BMI: 22.98      Labs:  Lab Results   Component Value Date    HA1C 9.3 (H) 03/17/2021      No results found for: "POCTHA1C"   CHOLESTEROL   Date Value Ref Range Status   02/02/2022 177 0 - 199 mg/dL Final     HDL CHOL   Date Value Ref Range Status   02/02/2022 36 (L) 40 - 60 mg/dL Final     LDL CALC   Date Value Ref Range Status   02/02/2022 77 0 - 130 mg/dL Final     TRIGLYCERIDES   Date Value Ref Range Status   02/02/2022  318 (H) 0 - 149 mg/dL Final     VLDL CALC   Date Value Ref Range Status   02/02/2022 64 mg/dL Final     CHOL/HDL RATIO   Date Value Ref Range Status   09/06/2021 4.8  Final     NON-HDL   Date Value Ref Range Status   09/06/2021 127 <=190 mg/dL Final       Estimated Nutrient Needs:  IBW: 166lb (75kg)          Calories: 1875      kcal per day (  25  kcal/    75    kg   IBW    )    Nutrition Diagnosis Altered nutrition related lab values related to uncontrolled T2DM as evidenced by HbA1c 7.8%- 11/12/21.    Intervention:  Discussion time began with patient at  8:45am  and ended at  9:18am  for at total of   74mn.       Education focused on review of dietary habit changes. Patient is happy with his current dietary regimen and is not ready to track food and blood sugar. Heavily discussed breakfast alternatives with emphasis on protein shakes. Compared  contrasted carnation instant breakfast cereal, Glucerna, and premier protein shake with furit in relation to weight. Patient prefers to try Glucerna at this time.     Handouts with discussion:   Healthful Recipe Packet    Monitor/Evaluation:     Patient switched to 2% milk instead of whole milk. He remembers to pair meals with protein to lower blood sugar. He has tried new breakfast ideas from last session, but is unsure of blood sugar readings afterwards. He is not ready to track food and blood sugar.     Personal Goals:   1) Choose skim milk instead of whole milk.   2) Pair protein and fiber at each meal using food balance formula.   3) Try kodiak oatmeal with carnation instant breakfast with skim milk. Add dry milk powder if blood sugar is still too high.     4) Get back to golfing when weather improves.   5) Choose sugar-free jelly on PB & J sanwich  6) Drink 3 16oz. Water bottles/day.     Provided RD contact information.    Summer F/u: meal build- example day, healthful recipe packet, monitor weight with Glucerna    [name], RD    Nursing Notes:   [name], [place]  02/21/22 5885  Signed     02/21/22 0900   Nutrition Management    State most important reason to use a meal plan 2 - Needs Review/Assistance   State how meal spacing helps blood sugar control 2 - Needs Review/Assistance   State how composition of meals affect blood sugar 2 - Needs Review/Assistance   Describe how to keep a food diary 1 - Needs Instruction   State how monitoring amount of food can help reach blood sugar levels 2 - Needs Review/Assistance   Describe personal meal plan 2 - Needs Review/Assistance   Use meal plan to plan meals 1 - Needs Instruction   Use meal plan when eating away from home 1 - Needs Instruction   Identify behaviors to help control weight 2 - Needs Review/Assistance   Explain how alcohol affects blood sugar 1 - Needs Instruction   Use food labels to choose food 2 - Needs Review/Assistance   Discuss benefits/cons of fiber 2 - Needs Review/Assistance   Plan eating/behavior changes working toward personal goals 2 - Needs Review/Assistance   Identify behaviors to help reduce risk of heart disease 2 - Needs Review/Assistance

## 2022-02-21 NOTE — Nursing Note (Signed)
02/21/22 0900   Nutrition Management    State most important reason to use a meal plan 2 - Needs Review/Assistance   State how meal spacing helps blood sugar control 2 - Needs Review/Assistance   State how composition of meals affect blood sugar 2 - Needs Review/Assistance   Describe how to keep a food diary 1 - Needs Instruction   State how monitoring amount of food can help reach blood sugar levels 2 - Needs Review/Assistance   Describe personal meal plan 2 - Needs Review/Assistance   Use meal plan to plan meals 1 - Needs Instruction   Use meal plan when eating away from home 1 - Needs Instruction   Identify behaviors to help control weight 2 - Needs Review/Assistance   Explain how alcohol affects blood sugar 1 - Needs Instruction   Use food labels to choose food 2 - Needs Review/Assistance   Discuss benefits/cons of fiber 2 - Needs Review/Assistance   Plan eating/behavior changes working toward personal goals 2 - Needs Review/Assistance   Identify behaviors to help reduce risk of heart disease 2 - Needs Review/Assistance

## 2022-02-23 ENCOUNTER — Other Ambulatory Visit (INDEPENDENT_AMBULATORY_CARE_PROVIDER_SITE_OTHER): Payer: Self-pay | Admitting: Internal Medicine

## 2022-02-23 ENCOUNTER — Encounter (HOSPITAL_BASED_OUTPATIENT_CLINIC_OR_DEPARTMENT_OTHER): Payer: Self-pay | Admitting: HEMATOLOGY/ONCOLOGY

## 2022-02-23 ENCOUNTER — Other Ambulatory Visit (HOSPITAL_BASED_OUTPATIENT_CLINIC_OR_DEPARTMENT_OTHER): Payer: Self-pay | Admitting: HEMATOLOGY/ONCOLOGY

## 2022-02-23 DIAGNOSIS — D509 Iron deficiency anemia, unspecified: Secondary | ICD-10-CM

## 2022-02-23 DIAGNOSIS — E1165 Type 2 diabetes mellitus with hyperglycemia: Secondary | ICD-10-CM

## 2022-02-23 MED ORDER — FERROUS SULFATE 325 MG (65 MG IRON) TABLET
325.0000 mg | ORAL_TABLET | Freq: Every day | ORAL | 3 refills | Status: DC
Start: 2022-02-23 — End: 2022-08-09

## 2022-02-23 NOTE — Telephone Encounter (Signed)
Pt. Left message requesting his alternative Rx for Metformin to be sent to the [facility] pharmacy please.     Shona Simpson, RN

## 2022-02-23 NOTE — Nursing Note (Signed)
Patient called today stating his iron pills were not sent to the [facility] in [place]. I checked his chart and they were sent to [facility] instead. Advised patient we will send a new script to the [facility] for him.   Melany Guernsey. Judeth Horn, LPN

## 2022-02-24 ENCOUNTER — Encounter (HOSPITAL_BASED_OUTPATIENT_CLINIC_OR_DEPARTMENT_OTHER): Payer: Self-pay

## 2022-02-24 ENCOUNTER — Ambulatory Visit (HOSPITAL_BASED_OUTPATIENT_CLINIC_OR_DEPARTMENT_OTHER): Payer: 59 | Admitting: Family

## 2022-02-24 MED ORDER — GLIPIZIDE 5 MG TABLET
ORAL_TABLET | ORAL | 0 refills | Status: DC
Start: 2022-02-24 — End: 2022-03-28

## 2022-02-24 NOTE — H&P (Deleted)
[facility], [place]  [place] [id number]  [phone]    Progress Note  Name: [name]  MRN: [id number]  DOB: 1945-12-21  Age: 77 y.o.  Date: 02/24/2022    Reason for Visit: No chief complaint on file.      History of Present Illness:  Hideo Googe is a 77 y.o. male who presents today for ***.    Prior Testing:     03/22/2021  Diagnostic laparoscopy with conversion to laparotomy  Open distal pancreatectomy and splenectomy  Patient History:  Past Medical History:   Diagnosis Date    Cancer (CMS [facility])     prostate    Deep vein thrombosis (DVT) (CMS [facility])     right leg    Esophageal reflux     H/O hearing loss     High cholesterol     History of kidney disease     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    HTN (hypertension)     Human immunodeficiency virus (HIV) disease (CMS HCC)     Hyperlipidemia     "borderline"    Hypothyroidism     MRSA (methicillin resistant staph aureus) culture positive 01/02/2021    MRSA left groin abscess 01/03/21    MRSA (methicillin resistant staph aureus) culture positive 01/02/2021    MRSA blood 01/02/21    Pulmonary embolism (CMS [place]) 03/31/2021    Thyroid disorder     Wears glasses          Past Surgical History:   Procedure Laterality Date    COLON SURGERY      per patient had 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 Darryl Nestle CERVICAL SPINE SURGERY  2001    HX COLOSTOMY REVERSAL      HX HERNIA REPAIR      HX LAPAROTOMY      KNEE SURGERY Bilateral     PANCREATECTOMY      SPLENECTOMY      WRIST SURGERY Right          Current Outpatient Medications   Medication Sig    acetaminophen (TYLENOL) 325 mg Oral Tablet Take 2 Tablets (650 mg total) by mouth Every 6 hours as needed for Pain    amLODIPine (NORVASC) 10 mg Oral Tablet Take 1 Tablet (10 mg total) by mouth Once a  day    apixaban (ELIQUIS) 2.5 mg Oral Tablet Take 1 Tablet (2.5 mg total) by mouth Twice daily    atorvastatin (LIPITOR) 80 mg Oral Tablet Take 0.5 Tablets (40 mg total) by mouth Every morning with breakfast    cholecalciferol, vitamin D3, 25 mcg (1,000 unit) Oral Tablet Take 1 Tablet (1,000 Units total) by mouth Once a day    cyanocobalamin (VITAMIN B 12) 1,000 mcg Oral Tablet Take 1 Tablet (1,000 mcg total) by mouth Every morning    docusate sodium (COLACE) 100 mg Oral Capsule Take 1 Capsule (100 mg total) by mouth    elviteg-cob-emtri-tenof ALAFEN (GENVOYA) 150-150-200-10 mg Oral Tablet Take 1 Tablet by mouth Once a day    ferrous sulfate (FEOSOL) 325 mg (65 mg iron) Oral Tablet Take 1 Tablet (325 mg total) by mouth Once a day    flash glucose sensor (FREESTYLE LIBRE 2 SENSOR)  Does not apply Kit To check blood glucose continuously. Change every 14 days    gabapentin (NEURONTIN) 100 mg Oral Capsule Take 1 Capsule (100 mg total) by mouth Three times a day    glipiZIDE (GLUCOTROL) 5 mg Oral Tablet Take 1 tablet twice daily    levothyroxine (SYNTHROID) 88 mcg Oral Tablet Take 1 Tablet (88 mcg total) by mouth Every morning    melatonin 3 mg Oral Tablet Take 2 Tablets (6 mg total) by mouth Every night    Mirtazapine (REMERON) 7.5 mg Oral Tablet Take 1 Tablet (7.5 mg total) by mouth Every night    MULTIVITAMIN ORAL Take 1 Tablet by mouth Once a day    oxyCODONE (ROXICODONE) 5 mg Oral Tablet Take 1 Tablet (5 mg total) by mouth Every 4 hours as needed for Pain    polyethylene glycol (MIRALAX) 17 gram/dose Oral Powder Take 3 teaspoons (17 g total) by mouth Once a day (Patient not taking: Reported on 02/08/2022)    sennosides-docusate sodium (SENOKOT-S) 8.6-50 mg Oral Tablet Take 1 Tablet by mouth Twice daily     No Known Allergies  Family Medical History:       Problem Relation (Age of Onset)    Cancer Mother, Father, Other    Heart Attack Mother    High Cholesterol Other    Stroke Other            Social History      Socioeconomic History    Marital status: Married     Spouse name: Not on file    Number of children: Not on file    Years of education: Not on file    Highest education level: Not on file   Occupational History    Not on file   Tobacco Use    Smoking status: Never    Smokeless tobacco: Never   Vaping Use    Vaping Use: Never used   Substance and Sexual Activity    Alcohol use: Not Currently    Drug use: Never    Sexual activity: Not on file   Other Topics Concern    Calcium intake adequate Not Asked    Computer Use Not Asked    Drives Not Asked    Exercise Concern Not Asked    Helmet Use Not Asked    Seat Belt Not Asked    Uses Cane Not Asked    Uses walker Not Asked    Uses wheelchair Not Asked    Right hand dominant Not Asked    Left hand dominant Not Asked    Ambidextrous Not Asked    Uses Scooter Not Asked    Activity Limitations Not Asked    Ability to Walk 1 Flight of Steps without SOB/CP Yes    Routine Exercise Yes    Ability to Walk 2 Flight of Steps without SOB/CP Yes    Unable to Ambulate No    Total Care No    Ability To Do Own ADL's Yes    Uses Walker No    Other Activity Level Yes    Uses Cane No   Social History Narrative    Not on file     Social Determinants of Health     Financial Resource Strain: Low Risk  (12/31/2021)    Financial Resource Strain     SDOH Financial: No   Transportation Needs: Low Risk  (12/31/2021)    Transportation Needs     SDOH Transportation: No   Social  Connections: Low Risk  (12/31/2021)    Social Connections     SDOH Social Isolation: 5 or more times a week   Intimate Partner Violence: Low Risk  (12/31/2021)    Intimate Partner Violence     SDOH Domestic Violence: No   Housing Stability: Low Risk  (12/31/2021)    Housing Stability     SDOH Housing Situation: I have housing.     SDOH Housing Worry: No     Review of Systems:                                      All other review of systems negative    Physical Exam:  There were no vitals taken for this visit.      Physical  Exam  No results were found from the past 30 days.      Assessment:      Plan:        Sheila Oats, FNP-C  02/24/2022, 08:08    This note may have been partially generated using MModal Fluency Direct system, and there may be some incorrect words, spellings, and punctuation that were not noted in checking the note before saving, though effort was made to avoid such errors.

## 2022-02-28 ENCOUNTER — Encounter (HOSPITAL_BASED_OUTPATIENT_CLINIC_OR_DEPARTMENT_OTHER): Payer: Self-pay | Admitting: Internal Medicine

## 2022-03-19 ENCOUNTER — Encounter (INDEPENDENT_AMBULATORY_CARE_PROVIDER_SITE_OTHER): Payer: Self-pay

## 2022-03-23 ENCOUNTER — Encounter (INDEPENDENT_AMBULATORY_CARE_PROVIDER_SITE_OTHER): Payer: 59 | Admitting: Orthopaedic Surgery

## 2022-03-28 ENCOUNTER — Encounter (INDEPENDENT_AMBULATORY_CARE_PROVIDER_SITE_OTHER): Payer: Self-pay | Admitting: Internal Medicine

## 2022-03-28 ENCOUNTER — Other Ambulatory Visit: Payer: Self-pay

## 2022-03-28 ENCOUNTER — Ambulatory Visit: Payer: 59 | Attending: Internal Medicine | Admitting: Internal Medicine

## 2022-03-28 VITALS — BP 128/72 | Ht 70.0 in | Wt 152.8 lb

## 2022-03-28 DIAGNOSIS — Z713 Dietary counseling and surveillance: Secondary | ICD-10-CM

## 2022-03-28 DIAGNOSIS — M810 Age-related osteoporosis without current pathological fracture: Secondary | ICD-10-CM

## 2022-03-28 DIAGNOSIS — Z978 Presence of other specified devices: Secondary | ICD-10-CM

## 2022-03-28 DIAGNOSIS — E1165 Type 2 diabetes mellitus with hyperglycemia: Secondary | ICD-10-CM | POA: Insufficient documentation

## 2022-03-28 LAB — POC HEMOGLOBIN A1C (RESULTS): HEMOGLOBIN A1C, POC: 7.1 % — ABNORMAL HIGH

## 2022-03-28 LAB — POC BLOOD GLUCOSE (RESULTS): GLUCOSE, POC: 165 mg/dl (ref 80–130)

## 2022-03-28 MED ORDER — GLIPIZIDE 5 MG TABLET
ORAL_TABLET | ORAL | 3 refills | Status: DC
Start: 2022-03-28 — End: 2022-08-26

## 2022-03-28 NOTE — Progress Notes (Signed)
[facility] [place]  [address], [place], [place] [address]  Dept: [phone]  Fax: [phone]    ENDOCRINOLOGY OUTPATIENT PROGRESS NOTE    Name: [name]  Age: 77 y.o., Sex: Male  MRN: [id number]    Referred by: [facility]  Reason for Consult: Diabetes Mellitus    HISTORY OF PRESENT ILLNESS:     [name] is a 77 y.o. male  who is here for his visit for diabetes.   Established care for osteoporosis.       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 A1c 9.3%    Following his discharge, patient stopped taking all insulin. A1c and glucose levels improved on own.   Suspecting DM related to type 2 rather than pancreatic insufficiency    A1c 7.1% in the setting of stage 3 CKD.     Current Regimen:   Glipizide 5 mg twice daily  Unable to tolerate metformin      Pertinent Osteoporosis History    S/p iv reclast in aug 2023    Known history of compression fracture since early 2023.  Atraumatic    Had a DEXA scan recently in June of 2023    T-score of lumbar spine -2.8.  T-score of total femoral hip -2  T-score of femoral neck -1.9    No history of steroid use, narcotic use, illicit drug use, nicotine abuse or alcohol use    Positive for prostate cancer.  On surveillance  Positive for HIV.  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    Past Family History  Mother with cancer and heart disease    Past Surgical History  Distal pancreatectomy    Social History   No history of smoking, alcohol  use or illicit drug use      Allergies  No Known Allergies     Medication History     Current Outpatient Medications   Medication Sig    acetaminophen (TYLENOL) 325 mg Oral Tablet Take 2 Tablets (650 mg total) by mouth Every 6 hours as needed for Pain    amLODIPine (NORVASC) 10 mg Oral Tablet Take 1 Tablet (10 mg total) by mouth Once a day    apixaban (ELIQUIS) 2.5 mg Oral Tablet Take 1 Tablet (2.5 mg total) by mouth Twice daily    atorvastatin (LIPITOR) 80 mg Oral Tablet Take 0.5 Tablets (40 mg total) by mouth Every morning with breakfast    cholecalciferol, vitamin D3, 25 mcg (1,000 unit) Oral Tablet Take 1 Tablet (1,000 Units total) by mouth Once a day    cyanocobalamin (VITAMIN B 12) 1,000 mcg Oral Tablet Take 1 Tablet (1,000 mcg total) by mouth Every morning    docusate sodium (COLACE) 100 mg Oral Capsule Take 1 Capsule (100 mg total) by mouth    elviteg-cob-emtri-tenof ALAFEN (GENVOYA) 150-150-200-10 mg Oral Tablet Take 1 Tablet by mouth Once a day    ferrous sulfate (FEOSOL) 325 mg (65 mg iron) Oral Tablet Take  1 Tablet (325 mg total) by mouth Once a day    flash glucose sensor (FREESTYLE LIBRE 2 SENSOR) Does not apply Kit To check blood glucose continuously. Change every 14 days    gabapentin (NEURONTIN) 100 mg Oral Capsule Take 1 Capsule (100 mg total) by mouth Three times a day    glipiZIDE (GLUCOTROL) 5 mg Oral Tablet Take 1 tablet twice daily    levothyroxine (SYNTHROID) 88 mcg Oral Tablet Take 1 Tablet (88 mcg total) by mouth Every morning    melatonin 3 mg Oral Tablet Take 2 Tablets (6 mg total) by mouth Every night    Mirtazapine (REMERON) 7.5 mg Oral Tablet Take 1 Tablet (7.5 mg total) by mouth Every night    MULTIVITAMIN ORAL Take 1 Tablet by mouth Once a day    oxyCODONE (ROXICODONE) 5 mg Oral Tablet Take 1 Tablet (5 mg total) by mouth Every 4 hours as needed for Pain    polyethylene glycol (MIRALAX) 17 gram/dose Oral Powder Take 3 teaspoons (17 g total) by mouth Once a day    sennosides-docusate  sodium (SENOKOT-S) 8.6-50 mg Oral Tablet Take 1 Tablet by mouth Twice daily        OBJECTIVE:     BP 128/72   Ht 1.778 m ('5\' 10"'$ )   Wt 69.3 kg (152 lb 12.5 oz)   BMI 21.92 kg/m        Physical Exam  Constitutional:       General: He is not in acute distress.     Appearance: He is not ill-appearing.   Neurological:      General: No focal deficit present.      Mental Status: He is alert and oriented to person, place, and time. Mental status is at baseline.   Psychiatric:         Mood and Affect: Mood normal.         Behavior: Behavior normal.         Thought Content: Thought content normal.         Judgment: Judgment normal.       Lab Investigations and Imaging  I have personally reviewed and interpreted all pertinent labs and imaging    BASIC METABOLIC PANEL  Lab Results   Component Value Date    SODIUM 140 02/15/2022    POTASSIUM 4.5 02/15/2022    CHLORIDE 105 02/15/2022    CO2 23 02/15/2022    ANIONGAP 12 02/15/2022    BUN 23 02/15/2022    CREATININE 1.49 (H) 02/15/2022    BUNCRRATIO 15 02/15/2022    GFR 48 (L) 02/15/2022    CALCIUM 9.8 02/15/2022    GLUCOSENF 134 (H) 02/02/2022        ASSESSMENT & PLAN:       ICD-10-CM    1. Osteoporosis, unspecified osteoporosis type, unspecified pathological fracture presence  M81.0 ALBUMIN     CALCIUM     CREATININE WITH EGFR     VITAMIN D 25 TOTAL      2. Uncontrolled type 2 diabetes mellitus with hyperglycemia (CMS HCC)  E11.65 C-PEPTIDE     glipiZIDE (GLUCOTROL) 5 mg Oral Tablet     POCT Blood Glucose/Fingerstick (AMB)     POCT HGB A1C     95251 - INTERPRETATION/REPORT CONTINUOUS GLUCOSE MONITORING VIA SUBCUTANEOUS SENSOR (AMB ONLY)      3. Nutritional counseling  Z71.3 C-PEPTIDE     glipiZIDE (GLUCOTROL) 5 mg Oral Tablet     95251 - INTERPRETATION/REPORT CONTINUOUS GLUCOSE MONITORING  VIA SUBCUTANEOUS SENSOR (AMB ONLY)      4. Uses self-applied continuous glucose monitoring device  Z97.8 C-PEPTIDE     glipiZIDE (GLUCOTROL) 5 mg Oral Tablet     95251 -  INTERPRETATION/REPORT CONTINUOUS GLUCOSE MONITORING VIA SUBCUTANEOUS SENSOR (AMB ONLY)         Uncontrolled Diabetes mellitus.  A1c 7.1% in the setting of stage 3 CKD    Type 2 diabetes mellitus superimposed with hyperglycemia from HAART therapy.   Unlikely related to pancreatic insufficiency    Free Style Libre CGM  Average days of CGM 14 days  Average blood glucose 161 mg/dl  Glucose variability 35.6%     70% in range (70-180 mg/dl)    23% high (180 - 250 mg/dl)  7% very high (>250 mg/dl)    0% low (70-54 mg/dl)  0% very low (<54 mg/dl)       Pt snacking later in the evening during his video game sessions.   Advised against snacking in order to improve his hyperglycemia as well as TG levels.     Plan  - glipizide 5 mg twice daily  - C-peptide levels  - pt is aware of his renal dysfunction. Used to follow up with [place] [facility]    Osteoporosis    Risk factors: age, h/o prostate cancer, h/o HIV    Positive for atraumatic compression fractures.     DEXA Scan in June 99991111    Anabolic agents contraindicated.   Not ideal for prolia give h/o immunosuppression with prostate cancer and HIV    Plan  - labs prior to next office visit  - iv reclast.  Dose 1 on 09/13/21. 2nd dose due in Aug 2024  - recommended 1000-2000 units vitamin D per day  - DEXA scan in June 2025      Return in about 5 months (around 08/26/2022).  The above plan of care communicated to [facility]     Lin Landsman, MD    03/28/2022  08:43

## 2022-03-29 ENCOUNTER — Other Ambulatory Visit (HOSPITAL_BASED_OUTPATIENT_CLINIC_OR_DEPARTMENT_OTHER): Payer: Self-pay | Admitting: HEMATOLOGY/ONCOLOGY

## 2022-03-29 NOTE — Nursing Note (Signed)
Phoned patient and let him know he does need to come to appt in march. Per Vanetta Mulders RN there was a message via triage yesterday. Darlyn Chamber, RN

## 2022-04-04 ENCOUNTER — Other Ambulatory Visit: Payer: Self-pay

## 2022-04-04 ENCOUNTER — Encounter (INDEPENDENT_AMBULATORY_CARE_PROVIDER_SITE_OTHER): Payer: Self-pay | Admitting: Orthopaedic Surgery

## 2022-04-04 ENCOUNTER — Ambulatory Visit (INDEPENDENT_AMBULATORY_CARE_PROVIDER_SITE_OTHER): Payer: 59 | Admitting: Orthopaedic Surgery

## 2022-04-04 ENCOUNTER — Encounter (INDEPENDENT_AMBULATORY_CARE_PROVIDER_SITE_OTHER): Payer: 59

## 2022-04-04 VITALS — BP 130/74 | HR 91 | Temp 98.4°F | Ht 70.0 in | Wt 152.0 lb

## 2022-04-04 DIAGNOSIS — M25552 Pain in left hip: Secondary | ICD-10-CM

## 2022-04-04 DIAGNOSIS — M25551 Pain in right hip: Secondary | ICD-10-CM

## 2022-04-04 DIAGNOSIS — M25532 Pain in left wrist: Secondary | ICD-10-CM

## 2022-04-04 DIAGNOSIS — M25852 Other specified joint disorders, left hip: Secondary | ICD-10-CM

## 2022-04-04 DIAGNOSIS — M25851 Other specified joint disorders, right hip: Secondary | ICD-10-CM

## 2022-04-04 NOTE — Progress Notes (Signed)
[facility]  Orthopaedic Progress Note        04/04/2022    [name]  10-Nov-1945  [id number]    Left wrist pain and back pain    SUBJECTIVE:  Patient comes to clinic today.  He states that he was having left wrist pain.  This went away.  He also has chronic issues with his back in his SI joints.  He has no groin pain.  He states that his back issues are doing fairly well.  He gets injections.  States currently about a 1 to 2/10.    OBJECTIVE:   Vitals:    04/04/22 0939   BP: 130/74   Pulse: 91   Temp: 36.9 C (98.4 F)   SpO2: 98%   Weight: 68.9 kg (152 lb)   Height: 1.778 m ('5\' 10"'$ )   BMI: 21.86         Gen: NAD  Lungs: non-labored breathing  CV: pulses regular rate    Left upper extremity  Patient is neurovascular intact.  No tenderness to palpation.  No deformity.    Bilateral lower extremities  Patient is neurovascular intact.  No pain with internal and external rotation of his hips.  He has good range of motion of his hips    X-ray left wrist does not show any fracture, no degenerative changes  X-ray bilateral hips does show some narrowing of the femoroacetabular joints.  There is some osteophyte formation.    ASSESSMENT: 77 y.o. 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  Narrowing of the femoroacetabular joints bilaterally, asymptomatic    PLAN:   Patient is not really having any pain.  He is weightbear as tolerated.  If he has any issues with his groin pain or left thumb pain he can come back and see me otherwise follow up on as-needed basis.    Blane Ohara, MD  Department of Orthopaedics

## 2022-04-08 ENCOUNTER — Other Ambulatory Visit (INDEPENDENT_AMBULATORY_CARE_PROVIDER_SITE_OTHER): Payer: Self-pay | Admitting: Internal Medicine

## 2022-04-08 NOTE — Telephone Encounter (Signed)
Returned patients call.  Informed him Dr. Colonel Bald was already out of office for the weekend.  Pateint states glucose is staing around 222-'246mg'$ /dl.  He reports he just finished Amoxycillin for a sinus infection.  He states he got his BP down to 133/78.  I suggested he follow a low carb/high protein diet and a lot of water until we get back to him Monday regarding a plan of action for his blood sugar but as for his blood pressure, I advised him to go to the ED if it goes over 180/100 mmhg.    Shona Simpson, RN

## 2022-04-08 NOTE — Telephone Encounter (Signed)
Pt. Left message stating something is going on with his glucose.  He said last night it went over '400mg'$ /dl and he was not able to get it down until 0530 and it only went to '220mg'$ /dl and it is staying there and all he has consumed is a glucerna and water and his morning medications.  He also says his blood pressure is 162/143.  I have pulled his [place] and scanned it into media and routed it to Dr. Colonel Bald.  Please advise.    Shona Simpson, RN  '

## 2022-04-11 ENCOUNTER — Encounter (HOSPITAL_BASED_OUTPATIENT_CLINIC_OR_DEPARTMENT_OTHER): Payer: Non-veteran care | Admitting: Internal Medicine

## 2022-04-11 ENCOUNTER — Encounter (INDEPENDENT_AMBULATORY_CARE_PROVIDER_SITE_OTHER): Payer: Self-pay | Admitting: Internal Medicine

## 2022-04-11 ENCOUNTER — Encounter (INDEPENDENT_AMBULATORY_CARE_PROVIDER_SITE_OTHER): Payer: Self-pay

## 2022-04-11 ENCOUNTER — Other Ambulatory Visit (INDEPENDENT_AMBULATORY_CARE_PROVIDER_SITE_OTHER): Payer: Self-pay | Admitting: Internal Medicine

## 2022-04-11 DIAGNOSIS — Z978 Presence of other specified devices: Secondary | ICD-10-CM

## 2022-04-11 DIAGNOSIS — E1165 Type 2 diabetes mellitus with hyperglycemia: Secondary | ICD-10-CM

## 2022-04-11 MED ORDER — LANTUS SOLOSTAR U-100 INSULIN 100 UNIT/ML (3 ML) SUBCUTANEOUS PEN
8.0000 [IU] | PEN_INJECTOR | Freq: Every evening | SUBCUTANEOUS | 3 refills | Status: DC
Start: 2022-04-11 — End: 2022-08-26

## 2022-04-11 MED ORDER — PEN NEEDLE, DIABETIC 32 GAUGE X 5/32"
3 refills | Status: DC
Start: 2022-04-11 — End: 2023-02-09

## 2022-04-11 NOTE — Telephone Encounter (Signed)
Pt. Notified via [facility]. Ordered insulin.    Shona Simpson, RN

## 2022-04-11 NOTE — Procedures (Signed)
Free Style Libre CGM  Average days of CGM 14 days  Average blood glucose 212 mg/dl  GMI 8.4%     36% in range (70-180 mg/dl)    38% high (180 - 250 mg/dl)  26% very high (>250 mg/dl)    0% low (70-54 mg/dl)  0% very low (<54 mg/dl)       Recommendations  - start Lantus 8 units once daily  - continue glipizide 5 mg twice daily    - Pt needs to stop snacking continuously after dinner.   - recommend breakfast, lunch and dinner  - glucose logs in 4 weeks  - make appt with [place] after 06/26/22    - pt needs to discuss BP with PCP or Nephrology at the [facility]

## 2022-04-11 NOTE — Telephone Encounter (Signed)
Free Style Libre CGM  Average days of CGM 14 days  Average blood glucose 212 mg/dl  GMI 8.4%     36% in range (70-180 mg/dl)    38% high (180 - 250 mg/dl)  26% very high (>250 mg/dl)    0% low (70-54 mg/dl)  0% very low (<54 mg/dl)       Recommendations  - start Lantus 8 units once daily  - continue glipizide 5 mg twice daily    - Pt needs to stop snacking continuously after dinner.   - recommend breakfast, lunch and dinner  - glucose logs in 4 weeks  - make appt with [place] after 06/26/22    - pt needs to discuss BP with PCP or Nephrology at the [facility]    Please order above and notify pt

## 2022-04-11 NOTE — Telephone Encounter (Signed)
Recs to [employer] provided

## 2022-04-14 ENCOUNTER — Other Ambulatory Visit (INDEPENDENT_AMBULATORY_CARE_PROVIDER_SITE_OTHER): Payer: Self-pay | Admitting: PHYSICIAN ASSISTANT

## 2022-04-14 DIAGNOSIS — B009 Herpesviral infection, unspecified: Secondary | ICD-10-CM

## 2022-04-14 MED ORDER — VALACYCLOVIR 1 GRAM TABLET
1000.0000 mg | ORAL_TABLET | Freq: Three times a day (TID) | ORAL | 0 refills | Status: AC
Start: 2022-04-14 — End: 2022-04-21

## 2022-04-14 NOTE — Nursing Note (Signed)
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 Received order from Melonie [place] PA-C to submit to pharmacy. Will submit prescription per protocol. Sheral Flow, LPN

## 2022-04-18 ENCOUNTER — Other Ambulatory Visit: Payer: 59 | Attending: NURSE PRACTITIONER | Admitting: NURSE PRACTITIONER

## 2022-04-18 ENCOUNTER — Ambulatory Visit (INDEPENDENT_AMBULATORY_CARE_PROVIDER_SITE_OTHER): Payer: 59 | Admitting: NURSE PRACTITIONER

## 2022-04-18 ENCOUNTER — Encounter (INDEPENDENT_AMBULATORY_CARE_PROVIDER_SITE_OTHER): Payer: Self-pay

## 2022-04-18 ENCOUNTER — Other Ambulatory Visit: Payer: Self-pay

## 2022-04-18 VITALS — BP 132/76 | HR 112 | Temp 98.5°F | Resp 20 | Ht 70.0 in | Wt 143.0 lb

## 2022-04-18 DIAGNOSIS — H6692 Otitis media, unspecified, left ear: Secondary | ICD-10-CM

## 2022-04-18 DIAGNOSIS — Z1152 Encounter for screening for COVID-19: Secondary | ICD-10-CM

## 2022-04-18 DIAGNOSIS — R059 Cough, unspecified: Secondary | ICD-10-CM

## 2022-04-18 DIAGNOSIS — R432 Parageusia: Secondary | ICD-10-CM | POA: Insufficient documentation

## 2022-04-18 MED ORDER — CEFDINIR 300 MG CAPSULE
300.0000 mg | ORAL_CAPSULE | Freq: Two times a day (BID) | ORAL | 0 refills | Status: DC
Start: 2022-04-18 — End: 2022-05-05

## 2022-04-18 NOTE — Progress Notes (Signed)
URGENT CARE, [facility] URGENT CARE  [place]  [place] [address]    Progress Note    Name: [name] MRN:  [id number]   Date: 04/18/2022 DOB:  04-Mar-1945 (77 y.o.)             Reason for Visit: Loss Of Taste (/), Cough, and Sinus Problem    History of Present Illness  [name] is a 77 y.o. male who is being seen today for The above stated symptoms, denies alleviating factors.  Patient states the symptoms have been going on for 2-3 weeks, states that he was given amoxicillin and has not improved.  Patient is requesting a COVID test.      Nursing Notes:  There are no exam notes on file for this visit.    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    HTN (hypertension)     Human immunodeficiency virus (HIV) disease (CMS [facility])     Hyperlipidemia     "borderline"    Hypothyroidism     MRSA (methicillin resistant staph aureus) culture positive 01/02/2021    MRSA left groin abscess 01/03/21    MRSA (methicillin resistant staph aureus) culture positive 01/02/2021    MRSA blood 01/02/21    Pulmonary embolism (CMS [place]) 03/31/2021    Thyroid disorder     Wears glasses          Past Surgical History:   Procedure Laterality Date    COLON SURGERY      per patient had 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 Darryl Nestle [place]    HX COLOSTOMY REVERSAL      HX HERNIA REPAIR      HX LAPAROTOMY      KNEE SURGERY Bilateral     PANCREATECTOMY      SPLENECTOMY      WRIST SURGERY Right          Current Outpatient Medications   Medication Sig    acetaminophen (TYLENOL) 325 mg Oral Tablet Take 2 Tablets (650  mg total) by mouth Every 6 hours as needed for Pain    amLODIPine (NORVASC) 10 mg Oral Tablet Take 1 Tablet (10 mg total) by mouth Once a day    atorvastatin (LIPITOR) 80 mg Oral Tablet Take 0.5 Tablets (40 mg total) by mouth Every morning with breakfast    cefdinir (OMNICEF) 300 mg Oral Capsule Take 1 Capsule (300 mg total) by mouth Twice daily for 7 days    cholecalciferol, vitamin D3, 25 mcg (1,000 unit) Oral Tablet Take 1 Tablet (1,000 Units total) by mouth Once a day    cyanocobalamin (VITAMIN B 12) 1,000 mcg Oral Tablet Take 1 Tablet (1,000 mcg total) by mouth Every morning    docusate sodium (COLACE) 100 mg Oral Capsule Take 1 Capsule (100 mg total) by mouth (Patient not taking: Reported on 04/04/2022)  elviteg-cob-emtri-tenof ALAFEN (GENVOYA) 150-150-200-10 mg Oral Tablet Take 1 Tablet by mouth Once a day    ferrous sulfate (FEOSOL) 325 mg (65 mg iron) Oral Tablet Take 1 Tablet (325 mg total) by mouth Once a day    flash glucose sensor (FREESTYLE LIBRE 2 SENSOR) Does not apply Kit To check blood glucose continuously. Change every 14 days    gabapentin (NEURONTIN) 100 mg Oral Capsule Take 3 Capsules (300 mg total) by mouth Three times a day For shingle pain    glipiZIDE (GLUCOTROL) 5 mg Oral Tablet Take 1 tablet twice daily    insulin glargine (LANTUS SOLOSTAR U-100 INSULIN) 100 unit/mL Subcutaneous Insulin Pen Inject 8 Units under the skin Every night    levothyroxine (SYNTHROID) 88 mcg Oral Tablet Take 75 mcg by mouth Every morning    melatonin 3 mg Oral Tablet Take 2 Tablets (6 mg total) by mouth Every night    Mirtazapine (REMERON) 7.5 mg Oral Tablet Take 1 Tablet (7.5 mg total) by mouth Every night (Patient not taking: Reported on 04/04/2022)    MULTIVITAMIN ORAL Take 1 Tablet by mouth Once a day    oxyCODONE (ROXICODONE) 5 mg Oral Tablet Take 1 Tablet (5 mg total) by mouth Every 4 hours as needed for Pain (Patient not taking: Reported on 04/04/2022)    pantoprazole (PROTONIX) 40 mg Oral Tablet, Delayed  Release (E.C.) Take 1 Tablet (40 mg total) by mouth Once a day    Pen Needle, Disposable, 32 gauge x 5/32" Needle Use one pen needle with daily Lantus injection.    polyethylene glycol (MIRALAX) 17 gram/dose Oral Powder Take 3 teaspoons (17 g total) by mouth Once a day (Patient not taking: Reported on 04/04/2022)    sennosides-docusate sodium (SENOKOT-S) 8.6-50 mg Oral Tablet Take 1 Tablet by mouth Twice daily (Patient not taking: Reported on 04/04/2022)    valACYclovir (VALTREX) 1 gram Oral Tablet Take 1 Tablet (1 g total) by mouth Three times a day for 7 days     No Known Allergies  Family Medical History:       Problem Relation (Age of Onset)    Cancer Mother, Father, Other    Heart Attack Mother    High Cholesterol Other    Stroke Other            Social History     Tobacco Use    Smoking status: Never    Smokeless tobacco: Never   Vaping Use    Vaping status: Never Used   Substance Use Topics    Alcohol use: Not Currently    Drug use: Never     BASIC METABOLIC PANEL  Lab Results   Component Value Date    SODIUM 140 02/15/2022    POTASSIUM 4.5 02/15/2022    CHLORIDE 105 02/15/2022    CO2 23 02/15/2022    ANIONGAP 12 02/15/2022    BUN 23 02/15/2022    CREATININE 1.49 (H) 02/15/2022    BUNCRRATIO 15 02/15/2022    GFR 48 (L) 02/15/2022    CALCIUM 9.8 02/15/2022    GLUCOSENF 134 (H) 02/02/2022        Hepatic Function  Lab Results   Component Value Date    ALBUMIN 4.5 02/15/2022    TOTALPROTEIN 8.1 (H) 02/15/2022    ALKPHOS 85 02/15/2022    GAMMAGT 57 (H) 04/03/2021    PROTHROMTME 10.4 02/02/2022    INR 1.01 02/02/2022    AST 16 02/15/2022    ALT 15 02/15/2022  BILIRUBINCON 0.1 06/24/2021         Lab Results   Component Value Date    HA1C 9.3 (H) 03/17/2021     Review of Systems  Review of Systems   Constitutional:  Negative for fever and malaise/fatigue.   HENT:  Positive for congestion.    Eyes:  Negative for discharge and redness.   Respiratory:  Positive for cough. Negative for hemoptysis, sputum production,  shortness of breath and wheezing.    Cardiovascular:  Negative for chest pain, palpitations and orthopnea.   Gastrointestinal:  Negative for abdominal pain, blood in stool, diarrhea and vomiting.   Genitourinary:  Negative for dysuria.   Musculoskeletal:  Negative for myalgias.   Skin:  Negative for rash.   Neurological:  Negative for seizures and loss of consciousness.       Physical Exam:  BP 132/76   Pulse (!) 112   Temp 36.9 C (98.5 F)   Resp 20   Ht 1.778 m (5\' 10" )   Wt 64.9 kg (143 lb)   SpO2 97%   BMI 20.52 kg/m       Physical Exam  Vitals and nursing note reviewed.   Constitutional:       General: He is not in acute distress.     Appearance: He is not toxic-appearing.   HENT:      Head: Normocephalic and atraumatic.      Right Ear: Tympanic membrane, ear canal and external ear normal.      Left Ear: Ear canal and external ear normal. Tympanic membrane is erythematous and bulging.      Nose: Congestion present.      Mouth/Throat:      Mouth: Mucous membranes are moist.      Pharynx: Oropharynx is clear.   Eyes:      Conjunctiva/sclera: Conjunctivae normal.   Cardiovascular:      Rate and Rhythm: Normal rate and regular rhythm.      Heart sounds: Normal heart sounds.   Pulmonary:      Effort: Pulmonary effort is normal. No tachypnea, bradypnea, accessory muscle usage or respiratory distress.      Breath sounds: Normal breath sounds.   Abdominal:      General: Bowel sounds are normal. There is no distension.      Palpations: Abdomen is soft. There is no mass.      Tenderness: There is no abdominal tenderness. There is no guarding or rebound.   Musculoskeletal:      Cervical back: Neck supple.   Lymphadenopathy:      Cervical: No cervical adenopathy.   Skin:     General: Skin is warm and dry.      Coloration: Skin is not cyanotic, jaundiced or pale.   Neurological:      Mental Status: He is alert and oriented to person, place, and time.   Psychiatric:         Mood and Affect: Affect normal.          Judgment: Judgment normal.         Assessment:    Cough, unspecified type    Loss of taste    Left otitis media        Plan:    Patient Instructions   We will call with the test results  Follow up immediately for new or worsening symptoms. Follow up with PCP if symptoms persist. Tylenol for pain and fever. Follow up if not improving after 48 to 72 hours.  Orders Placed This Encounter    COVID-19 SCREENING - SYMPTOMATIC (OP)    cefdinir (OMNICEF) 300 mg Oral Capsule       Follow up: No follow-ups on file.    Follow up with PCP.  Seek medical attention for new or worsening symptoms.    This note was partially created using MModal Fluency Direct system (voice recognition software) and is inherently subject to errors including those of syntax and "sound-alike" substitutions which may escape proofreading.  In such instances, original meaning may be extrapolated by contextual derivation.     I saw the patient independently.            Kavin Leech, FNP  04/18/2022, 19:45

## 2022-04-18 NOTE — Patient Instructions (Signed)
We will call with the test results  Follow up immediately for new or worsening symptoms. Follow up with PCP if symptoms persist. Tylenol for pain and fever. Follow up if not improving after 48 to 72 hours.

## 2022-04-19 LAB — COVID-19 ~~LOC~~ MOLECULAR LAB TESTING: SARS-CoV-2: NOT DETECTED

## 2022-04-20 ENCOUNTER — Ambulatory Visit (HOSPITAL_BASED_OUTPATIENT_CLINIC_OR_DEPARTMENT_OTHER): Payer: Self-pay | Admitting: Family

## 2022-04-21 ENCOUNTER — Other Ambulatory Visit: Payer: Self-pay

## 2022-04-21 ENCOUNTER — Emergency Department
Admission: EM | Admit: 2022-04-21 | Discharge: 2022-04-21 | Disposition: A | Discharge status: Home / Self Care | Payer: 59 | Attending: Emergency Medicine | Admitting: Emergency Medicine

## 2022-04-21 ENCOUNTER — Emergency Department (HOSPITAL_COMMUNITY): Payer: 59

## 2022-04-21 ENCOUNTER — Encounter (HOSPITAL_BASED_OUTPATIENT_CLINIC_OR_DEPARTMENT_OTHER): Payer: Self-pay | Admitting: HEMATOLOGY/ONCOLOGY

## 2022-04-21 ENCOUNTER — Ambulatory Visit (HOSPITAL_BASED_OUTPATIENT_CLINIC_OR_DEPARTMENT_OTHER)
Admission: RE | Admit: 2022-04-21 | Discharge: 2022-04-21 | Disposition: A | Discharge status: Home / Self Care | Payer: 59 | Source: Ambulatory Visit | Attending: HEMATOLOGY/ONCOLOGY | Admitting: HEMATOLOGY/ONCOLOGY

## 2022-04-21 ENCOUNTER — Ambulatory Visit (HOSPITAL_BASED_OUTPATIENT_CLINIC_OR_DEPARTMENT_OTHER): Payer: Self-pay

## 2022-04-21 ENCOUNTER — Ambulatory Visit (HOSPITAL_BASED_OUTPATIENT_CLINIC_OR_DEPARTMENT_OTHER): Payer: 59 | Admitting: HEMATOLOGY/ONCOLOGY

## 2022-04-21 VITALS — BP 118/77 | HR 106 | Temp 98.2°F | Resp 18 | Ht 70.0 in | Wt 143.2 lb

## 2022-04-21 DIAGNOSIS — R6884 Jaw pain: Secondary | ICD-10-CM | POA: Insufficient documentation

## 2022-04-21 DIAGNOSIS — R9431 Abnormal electrocardiogram [ECG] [EKG]: Secondary | ICD-10-CM | POA: Insufficient documentation

## 2022-04-21 DIAGNOSIS — Z8546 Personal history of malignant neoplasm of prostate: Secondary | ICD-10-CM | POA: Insufficient documentation

## 2022-04-21 DIAGNOSIS — D509 Iron deficiency anemia, unspecified: Secondary | ICD-10-CM

## 2022-04-21 DIAGNOSIS — I10 Essential (primary) hypertension: Secondary | ICD-10-CM | POA: Insufficient documentation

## 2022-04-21 DIAGNOSIS — Z8739 Personal history of other diseases of the musculoskeletal system and connective tissue: Secondary | ICD-10-CM | POA: Insufficient documentation

## 2022-04-21 DIAGNOSIS — Z86718 Personal history of other venous thrombosis and embolism: Secondary | ICD-10-CM | POA: Insufficient documentation

## 2022-04-21 DIAGNOSIS — E039 Hypothyroidism, unspecified: Secondary | ICD-10-CM | POA: Insufficient documentation

## 2022-04-21 DIAGNOSIS — E119 Type 2 diabetes mellitus without complications: Secondary | ICD-10-CM | POA: Insufficient documentation

## 2022-04-21 DIAGNOSIS — Z7901 Long term (current) use of anticoagulants: Secondary | ICD-10-CM | POA: Insufficient documentation

## 2022-04-21 DIAGNOSIS — Z86711 Personal history of pulmonary embolism: Secondary | ICD-10-CM | POA: Insufficient documentation

## 2022-04-21 DIAGNOSIS — B20 Human immunodeficiency virus [HIV] disease: Secondary | ICD-10-CM

## 2022-04-21 DIAGNOSIS — Z1152 Encounter for screening for COVID-19: Secondary | ICD-10-CM | POA: Insufficient documentation

## 2022-04-21 DIAGNOSIS — R0602 Shortness of breath: Secondary | ICD-10-CM | POA: Insufficient documentation

## 2022-04-21 DIAGNOSIS — E7800 Pure hypercholesterolemia, unspecified: Secondary | ICD-10-CM | POA: Insufficient documentation

## 2022-04-21 DIAGNOSIS — Z21 Asymptomatic human immunodeficiency virus [HIV] infection status: Secondary | ICD-10-CM | POA: Insufficient documentation

## 2022-04-21 DIAGNOSIS — R531 Weakness: Secondary | ICD-10-CM

## 2022-04-21 LAB — CBC WITH DIFF
BASOPHIL #: <0.1 10*3/uL (ref <=0.20)
BASOPHIL %: 0 %
EOSINOPHIL #: <0.1 10*3/uL (ref <=0.50)
EOSINOPHIL %: 0 %
HCT: 40.6 % (ref 38.9–52.0)
HCT: 40.4 % (ref 38.9–52.0)
HGB: 13.2 g/dL — ABNORMAL LOW (ref 13.4–17.5)
HGB: 13.3 g/dL — ABNORMAL LOW (ref 13.4–17.5)
IMMATURE GRANULOCYTE #: 0.27 10*3/uL — ABNORMAL HIGH (ref <0.10)
IMMATURE GRANULOCYTE %: 2 % — ABNORMAL HIGH (ref 0.0–1.0)
LYMPHOCYTE #: 1.22 10*3/uL (ref 1.00–4.80)
LYMPHOCYTE %: 8 %
MCH: 26.6 pg (ref 26.0–32.0)
MCH: 26.4 pg (ref 26.0–32.0)
MCHC: 32.5 g/dL (ref 31.0–35.5)
MCHC: 32.9 g/dL (ref 31.0–35.5)
MCV: 81.7 fL (ref 78.0–100.0)
MCV: 80.2 fL (ref 78.0–100.0)
MONOCYTE #: 0.26 10*3/uL (ref 0.20–1.10)
MONOCYTE %: 2 %
MPV: 10.2 fL (ref 8.7–12.5)
NEUTROPHIL #: 14.01 10*3/uL — ABNORMAL HIGH (ref 1.50–7.70)
NEUTROPHIL %: 88 %
PLATELETS: 200 10*3/uL (ref 150–400)
PLATELETS: 192 10*3/uL (ref 150–400)
RBC: 4.97 10*6/uL (ref 4.50–6.10)
RBC: 5.04 10*6/uL (ref 4.50–6.10)
RDW-CV: 25.7 % — ABNORMAL HIGH (ref 11.5–15.5)
RDW-CV: 25.6 % — ABNORMAL HIGH (ref 11.5–15.5)
WBC: 15.8 10*3/uL — ABNORMAL HIGH (ref 3.7–11.0)
WBC: 15.7 10*3/uL — ABNORMAL HIGH (ref 3.7–11.0)

## 2022-04-21 LAB — MANUAL DIFF AND MORPHOLOGY-SYSMEX
BASOPHIL #: <0.1 10*3/uL (ref <=0.20)
BASOPHIL %: 0 %
EOSINOPHIL #: <0.1 10*3/uL (ref <=0.50)
EOSINOPHIL %: 0 %
LYMPHOCYTE #: 2.36 10*3/uL (ref 1.00–4.80)
LYMPHOCYTE %: 15 %
MONOCYTE #: 0.16 10*3/uL — ABNORMAL LOW (ref 0.20–1.10)
MONOCYTE %: 1 %
NEUTROPHIL #: 13.19 10*3/uL — ABNORMAL HIGH (ref 1.50–7.70)
NEUTROPHIL %: 83 %
NEUTROPHIL BANDS %: 1 %
NRBC FROM MANUAL DIFF: 3 per 100 WBC — ABNORMAL HIGH (ref <=1)

## 2022-04-21 LAB — COMPREHENSIVE METABOLIC PANEL, NON-FASTING
ALBUMIN: 3 g/dL — ABNORMAL LOW (ref 3.4–4.8)
ALBUMIN: 3 g/dL — ABNORMAL LOW (ref 3.4–4.8)
ALKALINE PHOSPHATASE: 87 U/L (ref 45–115)
ALKALINE PHOSPHATASE: 87 U/L (ref 45–115)
ALT (SGPT): 55 U/L (ref 10–55)
ALT (SGPT): 56 U/L — ABNORMAL HIGH (ref 10–55)
ANION GAP: 7 mmol/L (ref 4–13)
ANION GAP: 5 mmol/L (ref 4–13)
AST (SGOT): 20 U/L (ref 8–45)
AST (SGOT): 22 U/L (ref 8–45)
BILIRUBIN TOTAL: 0.4 mg/dL (ref 0.3–1.3)
BILIRUBIN TOTAL: 0.4 mg/dL (ref 0.3–1.3)
BUN/CREA RATIO: 23 — ABNORMAL HIGH (ref 6–22)
BUN/CREA RATIO: 25 — ABNORMAL HIGH (ref 6–22)
BUN: 27 mg/dL — ABNORMAL HIGH (ref 8–25)
BUN: 28 mg/dL — ABNORMAL HIGH (ref 8–25)
CALCIUM: 10.2 mg/dL (ref 8.6–10.3)
CALCIUM: 10.5 mg/dL — ABNORMAL HIGH (ref 8.6–10.3)
CHLORIDE: 101 mmol/L (ref 96–111)
CHLORIDE: 100 mmol/L (ref 96–111)
CO2 TOTAL: 24 mmol/L (ref 23–31)
CO2 TOTAL: 25 mmol/L (ref 23–31)
CREATININE: 1.15 mg/dL (ref 0.75–1.35)
CREATININE: 1.12 mg/dL (ref 0.75–1.35)
ESTIMATED GFR - MALE: 66 mL/min/BSA (ref >=60)
ESTIMATED GFR - MALE: 68 mL/min/BSA (ref >=60)
GLUCOSE: 288 mg/dL — ABNORMAL HIGH (ref 65–125)
GLUCOSE: 127 mg/dL — ABNORMAL HIGH (ref 65–125)
POTASSIUM: 4.5 mmol/L (ref 3.5–5.1)
POTASSIUM: 4.4 mmol/L (ref 3.5–5.1)
PROTEIN TOTAL: 6.4 g/dL (ref 6.0–8.0)
PROTEIN TOTAL: 7.1 g/dL (ref 6.0–8.0)
SODIUM: 132 mmol/L — ABNORMAL LOW (ref 136–145)
SODIUM: 130 mmol/L — ABNORMAL LOW (ref 136–145)

## 2022-04-21 LAB — EXTENDED RESPIRATORY VIRUS PANEL

## 2022-04-21 LAB — MORPHOLOGY

## 2022-04-21 LAB — PT/INR
INR: 0.9 (ref <=5.00)
PROTHROMBIN TIME: 10.6 seconds (ref 9.7–13.6)

## 2022-04-21 LAB — LACTIC ACID LEVEL W/ REFLEX FOR LEVEL >2.0: LACTIC ACID: 1.7 mmol/L (ref 0.5–2.2)

## 2022-04-21 LAB — TROPONIN-I
TROPONIN-I HS: 7.9 ng/L (ref <=35.0)
TROPONIN-I HS: 8.5 ng/L (ref <=35.0)
TROPONIN-I HS: 7.8 ng/L (ref <=35.0)
TROPONIN-I HS: 6.7 ng/L (ref <=35.0)

## 2022-04-21 LAB — FERRITIN: FERRITIN: 512 ng/mL — ABNORMAL HIGH (ref 20–300)

## 2022-04-21 LAB — PTT (PARTIAL THROMBOPLASTIN TIME): APTT: 23.5 seconds — ABNORMAL LOW (ref 26.0–39.0)

## 2022-04-21 MED ORDER — SODIUM CHLORIDE 0.9 % IV BOLUS
1000.0000 mL | INJECTION | Status: AC
Start: 2022-04-21 — End: 2022-04-21
  Administered 2022-04-21: 1000 mL via INTRAVENOUS
  Administered 2022-04-21: 0 mL via INTRAVENOUS

## 2022-04-21 MED ORDER — IOPAMIDOL 370 MG IODINE/ML (76 %) INTRAVENOUS SOLUTION
80.0000 mL | INTRAVENOUS | Status: AC
Start: 2022-04-21 — End: 2022-04-21
  Administered 2022-04-21: 80 mL via INTRAVENOUS

## 2022-04-21 MED ORDER — SODIUM CHLORIDE 0.9 % (FLUSH) INJECTION SYRINGE
3.0000 mL | INJECTION | INTRAMUSCULAR | Status: DC | PRN
Start: 2022-04-21 — End: 2022-04-21

## 2022-04-21 MED ORDER — ACETAMINOPHEN 1,000 MG/100 ML (10 MG/ML) INTRAVENOUS SOLUTION
1000.0000 mg | INTRAVENOUS | Status: AC
Start: 2022-04-21 — End: 2022-04-21
  Administered 2022-04-21: 0 mg via INTRAVENOUS
  Administered 2022-04-21: 1000 mg via INTRAVENOUS
  Filled 2022-04-21: qty 100

## 2022-04-21 MED ORDER — NAPROXEN 500 MG TABLET
500.0000 mg | ORAL_TABLET | Freq: Two times a day (BID) | ORAL | 0 refills | Status: DC
Start: 2022-04-21 — End: 2022-05-05

## 2022-04-21 MED ORDER — SODIUM CHLORIDE 0.9 % (FLUSH) INJECTION SYRINGE
3.0000 mL | INJECTION | Freq: Three times a day (TID) | INTRAMUSCULAR | Status: DC
Start: 2022-04-21 — End: 2022-04-21
  Administered 2022-04-21: 3 mL

## 2022-04-21 MED ORDER — ASPIRIN 81 MG CHEWABLE TABLET
324.0000 mg | CHEWABLE_TABLET | ORAL | Status: DC
Start: 2022-04-21 — End: 2022-04-21
  Filled 2022-04-21: qty 4

## 2022-04-21 MED ORDER — FUROSEMIDE 10 MG/ML INJECTION SOLUTION
20.0000 mg | INTRAMUSCULAR | Status: DC
Start: 2022-04-21 — End: 2022-04-21
  Filled 2022-04-21: qty 2

## 2022-04-21 MED ORDER — NITROGLYCERIN 2 % TRANSDERMAL OINTMENT - PACKET
0.5000 [in_us] | TOPICAL_OINTMENT | TRANSDERMAL | Status: DC
Start: 2022-04-21 — End: 2022-04-21
  Filled 2022-04-21: qty 1

## 2022-04-21 MED ORDER — METOPROLOL TARTRATE 5 MG/5 ML INTRAVENOUS SOLUTION
5.0000 mg | INTRAVENOUS | Status: DC
Start: 2022-04-21 — End: 2022-04-21
  Filled 2022-04-21: qty 5

## 2022-04-21 NOTE — ED Attending Handoff Note (Signed)
Transfer of Care  Attending: Dr. Jamison Oka    Time: 23:01 on 04/21/2022    I have assumed the care of [name] from Luther Bradley PA-C after discussing his condition and plan of care. I will follow-up on any pending ancillary studies and provide further treatment and management of the patient as deemed necessary until their disposition from the department.      The following are pending upon transfer of care and were reviewed prior to patient disposition: Labs and imaging.    Plan: Labs and imaging reviewed. Medical Records reviewed.    Lab results:  Results for orders placed or performed during the hospital encounter of 04/21/22 (from the past 24 hour(s))   RESPIRATORY VIRUS PANEL    Specimen: Nasopharyngeal Swab   Result Value Ref Range    ADENOVIRUS ARRAY Not Detected Not Detected    CORONAVIRUS 229E Not Detected Not Detected    CORONAVIRUS HKU1 Not Detected Not Detected    CORONAVIRUS NL63 Not Detected Not Detected    CORONAVIRUS OC43 Not Detected Not Detected    SARS CORONAVIRUS 2 (SARS-CoV-2) Not Detected Not Detected    METAPNEUMOVIRUS ARRAY Not Detected Not Detected    RHINOVIRUS/ENTEROVIRUS ARRAY Not Detected Not Detected    INFLUENZA A Not Detected Not Detected    INFLUENZA B ARRAY Not Detected Not Detected    PARAINFLUENZA 1 ARRAY Not Detected Not Detected    PARAINFLUENZA 2 ARRAY Not Detected Not Detected    PARAINFLUENZA 3 ARRAY Not Detected Not Detected    PARAINFLUENZA 4 ARRAY Not Detected Not Detected    RSV ARRAY Not Detected Not Detected    BORDETELLA PARAPERTUSSIS (IS 1001) Not Detected Not Detected    BORDETELLA PERTUSSIS ARRAY Not Detected Not Detected    CHLAMYDOPHILA PNEUMONIAE ARRAY Not Detected Not Detected    MYCOPLASMA PNEUMONIAE ARRAY Not Detected Not Detected   TROPONIN-I - TO BE DRAWN NOW   Result Value Ref Range    TROPONIN-I HS 7.9 <=35.0 ng/L ng/L   TROPONIN-I - TO BE DRAWN IN ONE HOUR AFTER INITIAL DRAW   Result Value Ref Range    TROPONIN-I HS 8.5 <=35.0 ng/L  ng/L   TROPONIN-I - TO BE DRAWN IN 3 HOURS AFTER INITIAL DRAW   Result Value Ref Range    TROPONIN-I HS 7.8 <=35.0 ng/L ng/L   CBC/DIFF    Narrative    The following orders were created for panel order CBC/DIFF.  Procedure                               Abnormality         Status                     ---------                               -----------         ------                     CBC WITH [id number]                Abnormal            Final result               MANUAL DIFF AND MORPHOLO.Marland KitchenMarland KitchenXS:[id number]  Abnormal  Final result                 Please view results for these tests on the individual orders.   COMPREHENSIVE METABOLIC PANEL, NON-FASTING   Result Value Ref Range    SODIUM 130 (L) 136 - 145 mmol/L    POTASSIUM 4.4 3.5 - 5.1 mmol/L    CHLORIDE 100 96 - 111 mmol/L    CO2 TOTAL 25 23 - 31 mmol/L    ANION GAP 5 4 - 13 mmol/L    BUN 28 (H) 8 - 25 mg/dL    CREATININE 1.12 0.75 - 1.35 mg/dL    BUN/CREA RATIO 25 (H) 6 - 22    ALBUMIN 3.0 (L) 3.4 - 4.8 g/dL     CALCIUM 10.5 (H) 8.6 - 10.3 mg/dL    GLUCOSE 127 (H) 65 - 125 mg/dL    ALKALINE PHOSPHATASE 87 45 - 115 U/L    ALT (SGPT) 56 (H) 10 - 55 U/L    AST (SGOT)  22 8 - 45 U/L    BILIRUBIN TOTAL 0.4 0.3 - 1.3 mg/dL    PROTEIN TOTAL 7.1 6.0 - 8.0 g/dL    ESTIMATED GFR - MALE 68 >=60 mL/min/BSA   PT/INR   Result Value Ref Range    PROTHROMBIN TIME 10.6 9.7 - 13.6 seconds    INR 0.90 <=5.00   PTT (PARTIAL THROMBOPLASTIN TIME)   Result Value Ref Range    APTT 23.5 (L) 26.0 - 39.0 seconds    Narrative    Therapeutic range for unfractionated heparin is 60-100 seconds.   LACTIC ACID LEVEL W/ REFLEX FOR LEVEL >2.0   Result Value Ref Range    LACTIC ACID 1.7 0.5 - 2.2 mmol/L   CBC WITH DIFF   Result Value Ref Range    WBC 15.7 (H) 3.7 - 11.0 x10^3/uL    RBC 5.04 4.50 - 6.10 x10^6/uL    HGB 13.3 (L) 13.4 - 17.5 g/dL    HCT 40.4 38.9 - 52.0 %    MCV 80.2 78.0 - 100.0 fL    MCH 26.4 26.0 - 32.0 pg    MCHC 32.9 31.0 - 35.5 g/dL    RDW-CV 25.6 (H) 11.5 - 15.5 %     PLATELETS 192 150 - 400 x10^3/uL    Narrative    Mean Platelet Volume - Not Reported   MANUAL DIFF AND MORPHOLOGY-SYSMEX   Result Value Ref Range    NEUTROPHIL % 83 %    LYMPHOCYTE %  15 %    MONOCYTE % 1 %    EOSINOPHIL % 0 %    BASOPHIL % 0 %    NEUTROPHIL BANDS % 1   %    NEUTROPHIL # 13.19 (H) 1.50 - 7.70 x10^3/uL    LYMPHOCYTE # 2.36 1.00 - 4.80 x10^3/uL    MONOCYTE # 0.16 (L) 0.20 - 1.10 x10^3/uL    EOSINOPHIL # <0.10 <=0.50 x10^3/uL    BASOPHIL # <0.10 <=0.20 x10^3/uL    NRBC FROM MANUAL DIFF 3 (H) <=1 per 100 WBC    ANISOCYTOSIS 2+/Moderate (A) None    TARGET CELLS 2+/Moderate (A) None   TROPONIN-I   Result Value Ref Range    TROPONIN-I HS 6.7 <=35.0 ng/L ng/L   Results for orders placed or performed during the hospital encounter of 04/21/22 (from the past 24 hour(s))   BLOOD CELL COUNT W/DIFF - CANCER CENTER    Narrative    The following orders were created for  panel order BLOOD CELL COUNT W/DIFF - CANCER CENTER.  Procedure                               Abnormality         Status                     ---------                               -----------         ------                     CBC WITH [id number]                Abnormal            Final result                 Please view results for these tests on the individual orders.   COMPREHENSIVE METABOLIC PANEL, NON-FASTING   Result Value Ref Range    SODIUM 132 (L) 136 - 145 mmol/L    POTASSIUM 4.5 3.5 - 5.1 mmol/L    CHLORIDE 101 96 - 111 mmol/L    CO2 TOTAL 24 23 - 31 mmol/L    ANION GAP 7 4 - 13 mmol/L    BUN 27 (H) 8 - 25 mg/dL    CREATININE 1.15 0.75 - 1.35 mg/dL    BUN/CREA RATIO 23 (H) 6 - 22    ALBUMIN 3.0 (L) 3.4 - 4.8 g/dL     CALCIUM 10.2 8.6 - 10.3 mg/dL    GLUCOSE 288 (H) 65 - 125 mg/dL    ALKALINE PHOSPHATASE 87 45 - 115 U/L    ALT (SGPT) 55 10 - 55 U/L    AST (SGOT)  20 8 - 45 U/L    BILIRUBIN TOTAL 0.4 0.3 - 1.3 mg/dL    PROTEIN TOTAL 6.4 6.0 - 8.0 g/dL    ESTIMATED GFR - MALE 66 >=60 mL/min/BSA   FERRITIN   Result Value Ref Range     FERRITIN 512 (H) 20 - 300 ng/mL   CBC WITH DIFF   Result Value Ref Range    WBC 15.8 (H) 3.7 - 11.0 x10^3/uL    RBC 4.97 4.50 - 6.10 x10^6/uL    HGB 13.2 (L) 13.4 - 17.5 g/dL    HCT 40.6 38.9 - 52.0 %    MCV 81.7 78.0 - 100.0 fL    MCH 26.6 26.0 - 32.0 pg    MCHC 32.5 31.0 - 35.5 g/dL    RDW-CV 25.7 (H) 11.5 - 15.5 %    PLATELETS 200 150 - 400 x10^3/uL    MPV 10.2 8.7 - 12.5 fL    NEUTROPHIL % 88.0 %    LYMPHOCYTE % 8.0 %    MONOCYTE % 2.0 %    EOSINOPHIL % 0.0 %    BASOPHIL % 0.0 %    NEUTROPHIL # 14.01 (H) 1.50 - 7.70 x10^3/uL    LYMPHOCYTE # 1.22 1.00 - 4.80 x10^3/uL    MONOCYTE # 0.26 0.20 - 1.10 x10^3/uL    EOSINOPHIL # <0.10 <=0.50 x10^3/uL    BASOPHIL # <0.10 <=0.20 x10^3/uL    IMMATURE GRANULOCYTE % 2.0 (H) 0.0 - 1.0 %    IMMATURE GRANULOCYTE # 0.27 (H) <0.10 x10^3/uL  MORPHOLOGY   Result Value Ref Range    ANISOCYTOSIS 2+/Moderate (A) None    TARGET CELLS 2+/Moderate (A) None       Imaging results:   Results for orders placed or performed during the hospital encounter of 04/21/22 (from the past 72 hour(s))   XR AP MOBILE CHEST     Status: None    Narrative    Chest AP portable upright 6:38 PM. Comparison 06/21/2021.  HISTORY: Chest pain     The lungs are well expanded and appear clear.  The right hemidiaphragm is mildly elevated, also present previously and overall, the lungs are better expanded. The heart size is acceptable considering the magnification by the AP technique. No significant vascular congestion or effusion is seen      Impression    1.   No active disease detected.          Radiologist location ID: [id number]     CT ANGIO CHEST FOR PULMONARY EMBOLUS W IV CONTRAST     Status: None    Narrative    Male, 77 years old.    CT ANGIO CHEST FOR PULMONARY EMBOLUS W IV CONTRAST performed on 04/21/2022 8:36 PM.    REASON FOR EXAM:  SOB and Tachy  CONTRAST: 80 ml's of Isovue 370    TECHNIQUE: Intravenous contrast utilized for study. Volumetric acquisition of the chest. Volume rendered 3-D  reconstructions of the intrathoracic arteries. This CT scanner is equipped with dose reducing technology. The exposure is automatically adjusted according to patient body size in order to deliver the lowest dose possible.    COMPARISON: CT chest of 06/21/2021.    FINDINGS: No focal consolidation, pleural effusion, pneumothorax. Dependent atelectasis bilaterally. Borderline normal heart size. No pericardial effusion. Coronary calcifications. Thoracic aorta is of normal caliber with scattered calcified plaque. No mediastinal or axillary lymphadenopathy. No filling defect to suggest pulmonary embolus. Pulmonary trunk is of normal caliber. Included abdomen is without acute abnormality. No acute fracture or traumatic malalignment.      Impression    No acute cardiopulmonary process.            Radiologist location ID: [id number]         ECG:    Date/Time ECG Read: 04/21/2022 1925   Most Recent EKG This Encounter   ECG 12 LEAD ONE TIME    Collection Time: 04/21/22  7:25 PM   Result Value    Ventricular rate 91    Atrial Rate 91    PR Interval 136    QRS Duration 78    QT Interval 358    QTC Calculation 440    Calculated P Axis 54    Calculated R Axis 13    Calculated T Axis 33    Narrative    Normal sinus rhythm  Normal ECG     MDM:   During the patient's stay in the emergency department, the above listed imaging and/or labs were performed to assist with medical decision making and were reviewed by myself when available for review.   Medical Decision Making  Amount and/or Complexity of Data Reviewed  Labs: ordered.  Radiology: ordered.    Risk  Prescription drug management.               Impression:   Clinical Impression   Generalized weakness (Primary)   Shortness of breath       Disposition:   Discharged  Discussed with patient all lab and/or imaging results, diagnosis, treatment, and need  for follow up.   It was advised that the patient return to the ED with any new, concerning, or worsening symptoms and follow up as  directed.   The patient verbalized understanding of all instructions and had no further questions or concerns.     Follow up:   [place]  [address]  [address]  [place] [address]  [phone]    Schedule an appointment as soon as possible for a visit       [facility] - Emergency Department  [address] [place]  [place] [phone]  [phone]    As needed, If symptoms worsen    I am scribing for, and in the presence of, Dr. Jamison Oka for services provided on  04/21/2022.  Ralene Bathe, [place], [place]  04/21/2022, 19:19    *** Don't forget .tgscribe AND .sign***

## 2022-04-21 NOTE — ED Triage Notes (Signed)
Patient arrives with complaint of SOB and weakness over the last 2 weeks, patient is currently on ATB for sinus infection

## 2022-04-21 NOTE — ED Provider Notes (Signed)
[facility]- Emergency Department    Attending Physician: Dr. Norma Fredrickson   CC:  Chief Complaint   Patient presents with   . Shortness of Breath     HPI:  [name] is a 77 y.o. male with past medical history of PE, HIV, DVT, hypertension, hyperlipidemia, hypothyroidism, diabetes, splenectomy who presents to the ED via POV with c/o shortness of breath and weakness.  Patient reports shortness of breath with activity and generalized weakness for 2 weeks.  Patient reports decreased appetite, loss of taste, and weight loss.Patient currently on [place] for sinus infection.  Patient complains of chronic back pain due to compression fracture of T8 vertebrae. Patient complains of chronic bilateral great toe numbness that has been present for over a year.  Patient denies chest pain, fever, headache, vision changes, dizziness, stroke-like symptoms, nausea, vomiting, abdominal pain, dysuria, hematuria, diarrhea, constipation, heart disease, lung disease, blood thinners. Reports no other issues or concerns at this.    Review of Systems:  Constitutional:  Generalized weakness  Skin: No rash or wound  HENT: No headaches or congestion  Eyes: No vision changes or discharge  Cardio: No chest pain or edema  Respiratory:  Shortness of breath   GI:  No abdominal pain, nausea, vomiting or stool changes  GU:  No urinary changes  MSK: No joint or back pain  Neuro: No LOC. No numbness, tingling or weakness.   All other systems reviewed and are negative or as noted in HPI.    History:  PMH:    Past Medical History:   Diagnosis Date   . Cancer (CMS [facility])     prostate   . Deep vein thrombosis (DVT) (CMS HCC)     right leg   . Esophageal reflux    . H/O hearing loss    . High cholesterol    . 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 HTN (hypertension)    . 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 Hyperlipidemia     "borderline"   . Hypothyroidism    . MRSA  (methicillin resistant staph aureus) culture positive 01/02/2021    MRSA left groin abscess 01/03/21   . MRSA (methicillin resistant staph aureus) culture positive 01/02/2021    MRSA blood 01/02/21   . Pulmonary embolism (CMS HCC) 03/31/2021   . Thyroid disorder    . Wears glasses          Previous Medications    ACETAMINOPHEN (TYLENOL) 325 MG ORAL TABLET    Take 2 Tablets (650 mg total) by mouth Every 6 hours as needed for Pain    AMLODIPINE (NORVASC) 10 MG ORAL TABLET    Take 1 Tablet (10 mg total) by mouth Once a day    ATORVASTATIN (LIPITOR) 80 MG ORAL TABLET    Take 0.5 Tablets (40 mg total) by mouth Every morning with breakfast    CEFDINIR (OMNICEF) 300 MG ORAL CAPSULE    Take 1 Capsule (300 mg total) by mouth Twice daily for 7 days    CHOLECALCIFEROL, VITAMIN D3, 25 MCG (1,000 UNIT) ORAL TABLET    Take 1 Tablet (1,000 Units total) by mouth Once a day    CYANOCOBALAMIN (VITAMIN B 12) 1,000 MCG ORAL TABLET    Take 1 Tablet (1,000 mcg total) by mouth Every morning    DOCUSATE SODIUM (COLACE) 100 MG ORAL CAPSULE    Take 1 Capsule (  100 mg total) by mouth    ELVITEG-COB-EMTRI-TENOF ALAFEN (GENVOYA) 150-150-200-10 MG ORAL TABLET    Take 1 Tablet by mouth Once a day    FERROUS SULFATE (FEOSOL) 325 MG (65 MG IRON) ORAL TABLET    Take 1 Tablet (325 mg total) by mouth Once a day    FLASH GLUCOSE SENSOR (FREESTYLE LIBRE 2 SENSOR) DOES NOT APPLY KIT    To check blood glucose continuously. Change every 14 days    GABAPENTIN (NEURONTIN) 100 MG ORAL CAPSULE    Take 3 Capsules (300 mg total) by mouth Three times a day For shingle pain    GLIPIZIDE (GLUCOTROL) 5 MG ORAL TABLET    Take 1 tablet twice daily    INSULIN GLARGINE (LANTUS SOLOSTAR U-100 INSULIN) 100 UNIT/ML SUBCUTANEOUS INSULIN PEN    Inject 8 Units under the skin Every night    LEVOTHYROXINE (SYNTHROID) 88 MCG ORAL TABLET    Take 75 mcg by mouth Every morning    MELATONIN 3 MG ORAL TABLET    Take 2 Tablets (6 mg total) by mouth Every night    MIRTAZAPINE (REMERON) 7.5  MG ORAL TABLET    Take 1 Tablet (7.5 mg total) by mouth Every night    MULTIVITAMIN ORAL    Take 1 Tablet by mouth Once a day    NAPROXEN (NAPROSYN) 500 MG ORAL TABLET    Take 1 Tablet (500 mg total) by mouth Twice daily with food for 7 days    OXYCODONE (ROXICODONE) 5 MG ORAL TABLET    Take 1 Tablet (5 mg total) by mouth Every 4 hours as needed for Pain    PANTOPRAZOLE (PROTONIX) 40 MG ORAL TABLET, DELAYED RELEASE (E.C.)    Take 1 Tablet (40 mg total) by mouth Once a day    PEN NEEDLE, DISPOSABLE, 32 GAUGE X 5/32" NEEDLE    Use one pen needle with daily Lantus injection.    POLYETHYLENE GLYCOL (MIRALAX) 17 GRAM/DOSE ORAL POWDER    Take 3 teaspoons (17 g total) by mouth Once a day    SENNOSIDES-DOCUSATE SODIUM (SENOKOT-S) 8.6-50 MG ORAL TABLET    Take 1 Tablet by mouth Twice daily    VALACYCLOVIR (VALTREX) 1 GRAM ORAL TABLET    Take 1 Tablet (1 g total) by mouth Three times a day for 7 days       PSH:    Past Surgical History:   Procedure Laterality Date   . COLON SURGERY      per patient had part of colon removed and had a colostomy   . COLONOSCOPY     . GASTROSCOPY     . 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 Parke Simmers   . [place]  2001   . HX COLOSTOMY REVERSAL     . HX HERNIA REPAIR     . HX LAPAROTOMY     . KNEE SURGERY Bilateral    . PANCREATECTOMY     . SPLENECTOMY     . WRIST SURGERY Right          Social Hx:    Social History     Socioeconomic History   . Marital status: Married     Spouse name: Not on file   . Number of children: Not on file   . Years of education: Not on file   . Highest education level: Not on file   Occupational History   .  Not on file   Tobacco Use   . Smoking status: Never   . Smokeless tobacco: Never   Vaping Use   . Vaping status: Never Used   Substance and Sexual Activity   . Alcohol use: Not Currently   . Drug use: Never   . Sexual activity: Not on file   Other Topics Concern   . Calcium intake  adequate Not Asked   . Computer Use Not Asked   . Drives Not Asked   . Exercise Concern Not Asked   . Helmet Use Not Asked   . Seat Belt Not Asked   . Uses Cane Not Asked   . Uses walker Not Asked   . Uses wheelchair Not Asked   . Right hand dominant Not Asked   . Left hand dominant Not Asked   . Ambidextrous Not Asked   . Uses Scooter Not Asked   . Activity Limitations Not Asked   . Ability to Walk 1 Flight of Steps without SOB/CP Yes   . Routine Exercise Yes   . Ability to Walk 2 Flight of Steps without SOB/CP Yes   . Unable to Ambulate No   . Total Care No   . Ability To Do Own ADL's Yes   . Uses Walker No   . Other Activity Level Yes   . Uses Cane No   Social History Narrative   . Not on file     Social Determinants of Health     Financial Resource Strain: Low Risk  (12/31/2021)    Financial Resource Strain    . SDOH Financial: No   Transportation Needs: Low Risk  (12/31/2021)    Transportation Needs    . SDOH Transportation: No   Social Connections: Low Risk  (12/31/2021)    Social Connections    . SDOH Social Isolation: 5 or more times a week   Intimate Partner Violence: Low Risk  (12/31/2021)    Intimate Partner Violence    . SDOH Domestic Violence: No   Housing Stability: Low Risk  (12/31/2021)    Housing Stability    . SDOH Housing Situation: I have housing.    Marland Kitchen SDOH Housing Worry: No     Family Hx:   Family History   Problem Relation Age of Onset   . Cancer Mother    . Heart Attack Mother    . Cancer Father    . Cancer Other    . High Cholesterol Other    . Stroke Other    . Diabetes Neg Hx    . Thyroid Disease Neg Hx    . Seizures Neg Hx      Allergies: No Known Allergies    Above history reviewed with patient, changes are as documented.    Physical Exam:   Nursing note and vitals reviewed.  ED Triage Vitals [04/21/22 1636]   BP (Non-Invasive) 118/77   Heart Rate (!) 102   Respiratory Rate 16   Temperature 37.2 C (98.9 F)   SpO2 93 %   Weight 64.9 kg (143 lb)   Height 1.778 m (5\' 10" )       Constitutional:  Pt is alert and oriented and appears well-developed and well-nourished. No acute distress.   HENT:   Mouth/Throat: Oropharynx is clear and moist.   Ears: No external abnormalities   Eyes: Conjunctivae and extraocular motions are normal. Pupils are equal, round, and reactive to light.   Neck: Normal range of motion. Neck supple.  Cardiovascular: Normal rate, regular rhythm and intact distal pulses.    Pulmonary/Chest: Effort normal. No respiratory distress. Breath sounds normal  Abdominal: Abdomen is soft, nontender, nondistended.  No rebound or guarding noted.   Musculoskeletal: Normal range of motion.   Neurological: Pt is alert and oriented. Grossly intact. Moving all extremities. Facies symmetric.   Skin: Skin is warm and dry without rash.  Color normal.      Course:   Impression: Pt presenting c/o   Chief Complaint   Patient presents with   . Shortness of Breath       Plan:Will obtain the following labs/imaging and give patient the following medications to alleviate symptoms:  Orders Placed This Encounter   . ADULT ROUTINE BLOOD CULTURE, SET OF 2 ADULT BOTTLES (BACTERIA AND YEAST)   . ADULT ROUTINE BLOOD CULTURE, SET OF 2 ADULT BOTTLES (BACTERIA AND YEAST)   . RESPIRATORY VIRUS PANEL   . XR AP MOBILE CHEST   . CT ANGIO CHEST FOR PULMONARY EMBOLUS W IV CONTRAST   . TROPONIN-I - TO BE DRAWN NOW   . TROPONIN-I - TO BE DRAWN IN ONE HOUR AFTER INITIAL DRAW   . TROPONIN-I - TO BE DRAWN IN 3 HOURS AFTER INITIAL DRAW   . CBC/DIFF   . COMPREHENSIVE METABOLIC PANEL, NON-FASTING   . PT/INR   . PTT (PARTIAL THROMBOPLASTIN TIME)   . LACTIC ACID LEVEL W/ REFLEX FOR LEVEL >2.0   . URINALYSIS, MACROSCOPIC   . CBC WITH DIFF   . OXYGEN PROTOCOL   . ECG 12 LEAD ONE TIME   . ECG 12 LEAD ONE TIME   . ECG 12 LEAD ONE TIME   . INSERT & MAINTAIN PERIPHERAL IV ACCESS   . PERIPHERAL IV DRESSING CHANGE   . NS flush syringe   . NS flush syringe   . acetaminophen (OFIRMEV) 1,000 mg (10 mg/mL) IV 100 mL (tot vol)   . NS bolus infusion 1,000  mL     Laboratory Results:  Labs Ordered/Reviewed   PTT (PARTIAL THROMBOPLASTIN TIME) - Abnormal; Notable for the following components:       Result Value    APTT 23.5 (*)     All other components within normal limits    Narrative:     Therapeutic range for unfractionated heparin is 60-100 seconds.   CBC WITH DIFF - Abnormal; Notable for the following components:    WBC 15.7 (*)     HGB 13.3 (*)     RDW-CV 25.6 (*)     All other components within normal limits    Narrative:     Mean Platelet Volume - Not Reported   PT/INR - Normal   ADULT ROUTINE BLOOD CULTURE, SET OF 2 BOTTLES (BACTERIA AND YEAST)   ADULT ROUTINE BLOOD CULTURE, SET OF 2 BOTTLES (BACTERIA AND YEAST)   RESPIRATORY VIRUS PANEL   TROPONIN-I   TROPONIN-I   TROPONIN-I   CBC/DIFF    Narrative:     The following orders were created for panel order CBC/DIFF.  Procedure                               Abnormality         Status                     ---------                               -----------         ------  CBC WITH [id number]                Abnormal            Preliminary result           Please view results for these tests on the individual orders.   COMPREHENSIVE METABOLIC PANEL, NON-FASTING   LACTIC ACID LEVEL W/ REFLEX FOR LEVEL >2.0   URINALYSIS, MACROSCOPIC         Radiographical Imaging:   Results for orders placed or performed during the hospital encounter of 04/21/22   XR AP MOBILE CHEST     Status: None    Narrative    Chest AP portable upright 6:38 PM. Comparison 06/21/2021.  HISTORY: Chest pain     The lungs are well expanded and appear clear.  The right hemidiaphragm is mildly elevated, also present previously and overall, the lungs are better expanded. The heart size is acceptable considering the magnification by the AP technique. No significant vascular congestion or effusion is seen      Impression    1.   No active disease detected.          Radiologist location ID: [id number]         EKG-   Most Recent EKG  This Encounter   ECG 12 LEAD ONE TIME    Collection Time: 04/21/22  5:45 PM   Result Value    Ventricular rate 87    Atrial Rate 87    PR Interval 132    QRS Duration 76    QT Interval 352    QTC Calculation 423    Calculated P Axis 50    Calculated R Axis 19    Calculated T Axis 35    Narrative    Normal sinus rhythm  Possible Left atrial enlargement  Possible Inferior infarct , age undetermined  Abnormal ECG         All labs were reviewed. Medical Records reviewed.     MDM/Course:  Visit Vitals  Vitals:    04/21/22 1815 04/21/22 1837 04/21/22 1838 04/21/22 1900   BP: 128/86   126/84   Pulse: 98 91 92 95   Resp: 18 18 20  (!) 24   Temp:       SpO2: 96% (!) 89% 98% 91%   Weight:       Height:       BMI:             ED Course as of 04/21/22 1904   Thu Apr 21, 2022   1835 [name] is a 77 y.o. male with past medical history of PE, HIV, DVT, hypertension, hyperlipidemia, hypothyroidism, diabetes, splenectomy who presents to the ED via POV with c/o shortness of breath and weakness.  Patient reports shortness of breath with activity and generalized weakness for 2 weeks.  Patient reports decreased appetite, loss of taste, and weight loss.Patient currently on [place] for sinus infection.  Patient complains of chronic back pain due to compression fracture of T8 vertebrae. Patient complains of chronic bilateral great toe numbness that has been present for over a year.  Patient denies chest pain, fever, headache, vision changes, dizziness, stroke-like symptoms, nausea, vomiting, abdominal pain, dysuria, hematuria, diarrhea, constipation, heart disease, lung disease. Reports no other issues or concerns at this.  Patient reports his HIV is under control.  Reports he is taking medication for it and states his viral load is 0 in his CD4 counts are normal range.  Upon physical exam, patient is resting comfortably in bed in no acute distress.  Physical exam is completely unremarkable.  Dorsalis pedis pulses present  bilaterally, lung sounds clear bilaterally, vitals stable.  O2 sats 96% patient is tachycardic.  CTA for PE rule out.    [place] of patient turned over to Dr. Norma Fredrickson.  Discussed history and treatment plan.  Dr. Norma Fredrickson will further evaluate and manage this patient.       Consults:   Disposition: Data Unavailable    19:04 Care of patient transferred to Dr. Doree Albee at this time, following discussion of pertinent history, physical exam, laboratory and imaging findings. Pending studies and plan of care discussed.     Clinical Impression:   Diagnoses       Diagnosis Comment Added By Time Added    Generalized weakness  Luther Bradley, PA-C 04/21/2022  7:00 PM    Shortness of breath  Luther Bradley, PA-C 04/21/2022  7:00 PM          Follow Up: No follow-up provider specified.  Medications Prescribed:   New Prescriptions    No medications on file       The co-signing faculty was physically present in the emergency department and available for consultation and did particpate in the care of this patient.    Luther Bradley, PA-C 04/21/2022, 19:04

## 2022-04-21 NOTE — Cancer Center Note (Signed)
[facility]  [address]   [place], [place] [address]   [place]  Date of Birth: [birth year]  MRN: [id number]  Date of Service:  04/21/2022  Referring Provider: No ref. provider found     Chief Complaint: Other (Iron deficiency anemia, 8 wk follow up)     HPI:  [name] is a 77 y.o. who presents to the [facility] at [facility] for evaluation and opinion regarding the venous thrombosis of right lower extremity.     He has significant past medical history of prostate cancer which has been treated with radiation therapy in 2017 and since then his prostate cancer is controlled as per patient with less than 0.1 of PSA.  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 He completed 3 months of anticoagulant therapy and then stopped the Eliquis after 3 months. He then had another surgery and developed PE's and was put on blood thinners again. He had a filter at some time, but the filter has since been removed. He is following at [facility] for management of his Eliquis. He was told to remain on a prophylaxis dose long term.         Patient seen today for follow up. He has been fighting an infection for 3 weeks which he is on antibiotics for. [facility] They check his PSA every 6 months with his last one being 0.1. Patient's main complain is pain in his mouth and jaw. He has been trying to get a referral to an ENT. Patient lost 20 lbs in the last 2 months due to decreased appetite but he is not regaining weight. Patient states his wife says he gets SOB and has lower extremity weakness. He has had trouble getting in to see  his PCP at the [place].       HGB 10.9     Oncology History  Oncology History    No history exists.       Past Medical History  Past Medical History:   Diagnosis Date    Cancer (CMS [facility])     prostate    Deep vein thrombosis (DVT) (CMS [facility])     right leg    Esophageal reflux     H/O hearing loss     High cholesterol     History of kidney disease     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    HTN (hypertension)     Human immunodeficiency virus (HIV) disease (CMS HCC)     Hyperlipidemia     "borderline"    Hypothyroidism     MRSA (methicillin resistant staph aureus) culture positive 01/02/2021    MRSA left groin abscess 01/03/21    MRSA (methicillin resistant staph aureus) culture positive 01/02/2021    MRSA blood 01/02/21    Pulmonary embolism (CMS [place]) 03/31/2021    Thyroid disorder     Wears glasses          Past Surgical History:   Procedure Laterality Date    COLON SURGERY      per patient had part of colon  removed and had a colostomy    COLONOSCOPY      GASTROSCOPY      HIP SURGERY Right 04/22/2020    Right hip arthrotomy 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 Darryl Nestle CERVICAL SPINE SURGERY  2001    HX COLOSTOMY REVERSAL      HX HERNIA REPAIR      HX LAPAROTOMY      KNEE SURGERY Bilateral     PANCREATECTOMY      SPLENECTOMY      WRIST SURGERY Right          Current Outpatient Medications   Medication Sig Dispense Refill    acetaminophen (TYLENOL) 325 mg Oral Tablet Take 2 Tablets (650 mg total) by mouth Every 6 hours as needed for Pain      amLODIPine (NORVASC) 10 mg Oral Tablet Take 1 Tablet (10 mg total) by mouth Once a day      atorvastatin (LIPITOR) 80 mg Oral Tablet Take 0.5 Tablets (40 mg total) by mouth Every morning with breakfast      cefdinir (OMNICEF) 300 mg Oral Capsule Take 1 Capsule (300 mg total) by mouth Twice daily for 7 days 14 Capsule 0    cholecalciferol, vitamin D3, 25 mcg (1,000 unit) Oral Tablet Take 1 Tablet (1,000 Units total) by mouth Once a day 90 Tablet 0     cyanocobalamin (VITAMIN B 12) 1,000 mcg Oral Tablet Take 1 Tablet (1,000 mcg total) by mouth Every morning      docusate sodium (COLACE) 100 mg Oral Capsule Take 1 Capsule (100 mg total) by mouth      elviteg-cob-emtri-tenof ALAFEN (GENVOYA) 150-150-200-10 mg Oral Tablet Take 1 Tablet by mouth Once a day 90 Tablet 1    ferrous sulfate (FEOSOL) 325 mg (65 mg iron) Oral Tablet Take 1 Tablet (325 mg total) by mouth Once a day 30 Tablet 3    flash glucose sensor (FREESTYLE LIBRE 2 SENSOR) Does not apply Kit To check blood glucose continuously. Change every 14 days 6 Each 3    gabapentin (NEURONTIN) 100 mg Oral Capsule Take 3 Capsules (300 mg total) by mouth Three times a day For shingle pain      glipiZIDE (GLUCOTROL) 5 mg Oral Tablet Take 1 tablet twice daily 180 Tablet 3    insulin glargine (LANTUS SOLOSTAR U-100 INSULIN) 100 unit/mL Subcutaneous Insulin Pen Inject 8 Units under the skin Every night 15 mL 3    levothyroxine (SYNTHROID) 88 mcg Oral Tablet Take 75 mcg by mouth Every morning      melatonin 3 mg Oral Tablet Take 2 Tablets (6 mg total) by mouth Every night      Mirtazapine (REMERON) 7.5 mg Oral Tablet Take 1 Tablet (7.5 mg total) by mouth Every night      MULTIVITAMIN ORAL Take 1 Tablet by mouth Once a day      oxyCODONE (ROXICODONE) 5 mg Oral Tablet Take 1 Tablet (5 mg total) by mouth Every 4 hours as needed for Pain      pantoprazole (PROTONIX) 40 mg Oral Tablet, Delayed Release (E.C.) Take 1 Tablet (40 mg total) by mouth Once a day      Pen Needle, Disposable, 32 gauge x 5/32" Needle Use one pen needle with daily Lantus injection. 100 Each 3    polyethylene glycol (MIRALAX) 17 gram/dose Oral Powder Take 3 teaspoons (17 g total) by mouth Once a day      sennosides-docusate sodium (SENOKOT-S) 8.6-50 mg  Oral Tablet Take 1 Tablet by mouth Twice daily      valACYclovir (VALTREX) 1 gram Oral Tablet Take 1 Tablet (1 g total) by mouth Three times a day for 7 days 21 Tablet 0     No current facility-administered  medications for this visit.     No Known Allergies  Social History     Tobacco Use    Smoking status: Never    Smokeless tobacco: Never   Vaping Use    Vaping status: Never Used   Substance Use Topics    Alcohol use: Not Currently    Drug use: Never      Family Medical History:       Problem Relation (Age of Onset)    Cancer Mother, Father, Other    Heart Attack Mother    High Cholesterol Other    Stroke Other            Review of Systems  As above  Physical Examination: BP 118/77   Pulse (!) 106   Temp 36.8 C (98.2 F)   Resp 18   Ht 1.778 m (5\' 10" )   Wt 65 kg (143 lb 3.2 oz)   SpO2 95%   BMI 20.55 kg/m    Performance Status:  0  General appearance: well appearing, alert, in no acute distress.  Skin: skin color, texture, tugor normal, no rashes or lesions  Head: nomocephalic, no masses, lesions, tendernesss or abdomalities  Eyes: Anicteric sclera.  Extraocular movements are intact  Ears: external ears normal  Oropharynx: Oropharynx clear, no erthema, no thrush.  Neck: Supple,thyroid not palpable  [id number], oriented and grossly intact    LABS  Results for orders placed or performed during the hospital encounter of 04/21/22 (from the past 72 hour(s))   BLOOD CELL COUNT W/DIFF - CANCER CENTER    Narrative    The following orders were created for panel order BLOOD CELL COUNT W/DIFF - CANCER CENTER.  Procedure                               Abnormality         Status                     ---------                               -----------         ------                     CBC WITH MG:[id number]                Abnormal            Final result                 Please view results for these tests on the individual orders.   COMPREHENSIVE METABOLIC PANEL, NON-FASTING   Result Value Ref Range    SODIUM 132 (L) 136 - 145 mmol/L    POTASSIUM 4.5 3.5 - 5.1 mmol/L    CHLORIDE 101 96 - 111 mmol/L    CO2 TOTAL 24 23 - 31 mmol/L    ANION GAP 7 4 - 13 mmol/L    BUN 27 (H) 8 - 25 mg/dL    CREATININE 1.15 0.75 - 1.35 mg/dL  BUN/CREA RATIO 23 (H) 6 - 22    ALBUMIN 3.0 (L) 3.4 - 4.8 g/dL     CALCIUM 10.2 8.6 - 10.3 mg/dL    GLUCOSE 288 (H) 65 - 125 mg/dL    ALKALINE PHOSPHATASE 87 45 - 115 U/L    ALT (SGPT) 55 10 - 55 U/L    AST (SGOT)  20 8 - 45 U/L    BILIRUBIN TOTAL 0.4 0.3 - 1.3 mg/dL    PROTEIN TOTAL 6.4 6.0 - 8.0 g/dL    ESTIMATED GFR - MALE 66 >=60 mL/min/BSA   CBC WITH DIFF   Result Value Ref Range    WBC 15.8 (H) 3.7 - 11.0 x10^3/uL    RBC 4.97 4.50 - 6.10 x10^6/uL    HGB 13.2 (L) 13.4 - 17.5 g/dL    HCT 40.6 38.9 - 52.0 %    MCV 81.7 78.0 - 100.0 fL    MCH 26.6 26.0 - 32.0 pg    MCHC 32.5 31.0 - 35.5 g/dL    RDW-CV 25.7 (H) 11.5 - 15.5 %    PLATELETS 200 150 - 400 x10^3/uL    MPV 10.2 8.7 - 12.5 fL    NEUTROPHIL % 88.0 %    LYMPHOCYTE % 8.0 %    MONOCYTE % 2.0 %    EOSINOPHIL % 0.0 %    BASOPHIL % 0.0 %    NEUTROPHIL # 14.01 (H) 1.50 - 7.70 x10^3/uL    LYMPHOCYTE # 1.22 1.00 - 4.80 x10^3/uL    MONOCYTE # 0.26 0.20 - 1.10 x10^3/uL    EOSINOPHIL # <0.10 <=0.50 x10^3/uL    BASOPHIL # <0.10 <=0.20 x10^3/uL    IMMATURE GRANULOCYTE % 2.0 (H) 0.0 - 1.0 %    IMMATURE GRANULOCYTE # 0.27 (H) <0.10 x10^3/uL   MORPHOLOGY   Result Value Ref Range    ANISOCYTOSIS 2+/Moderate (A) None    TARGET CELLS 2+/Moderate (A) None   Results for orders placed or performed in visit on 04/18/22 (from the past 72 hour(s))   COVID-19 SCREENING - SYMPTOMATIC (OP)   Result Value Ref Range    SARS-CoV-2 Not Detected Not Detected    Narrative    Results are for the qualitative identification of SARS-CoV-2 (formerly 2019-nCoV) RNA. The SARS-CoV-2 RNA is generally detectable in nasopharyngeal swabs and nasal wash/aspirates during the ACUTE PHASE of infection. Hence, this test is intended to be performed on respiratory specimens collected from individuals who meet Centers for Disease Control and Prevention (CDC) clinical and/or epidemiological criteria for Coronavirus Disease 2019 (COVID-19) testing. CDC COVID-19 criteria for testing on human specimens are  available at [facility] webpage information for Healthcare Professionals: Coronavirus Disease 2019 (COVID-19) (YogurtCereal.co[employer]uk).    Disclaimer:  This assay has been authorized by FDA under an Emergency Use Authorization for use in laboratories certified under the Clinical Laboratory Improvement Amendments of 1988 (CLIA), 42 U.S.C. [phone], to perform high complexity tests. The impacts of vaccines, antiviral therapeutics, antibiotics, chemotherapeutic or immunosuppressant drugs have not been evaluated.    Test methodology:   Cepheid Xpert Xpress SARS-CoV-2 Assay real-time polymerase chain reaction (RT-PCR) test on the GeneXpert Dx system.     Outside lab CBC is within normal limits creatinine is 1.22 LFTs are within normal limit LDH is slightly elevated.   Radiology  No results were found from the past 30 days.    Last Mammogram (if applicable): No results found for this or any previous visit (from the past 17520 hour(s)).    Last PetScan (if  applicable): No results found for this or any previous visit (from the past 17520 hour(s)).    Recent Pathology  Last Pathology/Cytology Result   SURGICAL PATHOLOGY SPECIMEN (Collected: 03/22/2021  4:36 PM)   Result Value Ref Range    Final Diagnosis       A. PRIOR MESH, REMOVAL:  - Benign fibroadipose tissue.    B. DISTAL PANCREAS AND SPLEEN, DISTAL PANCREATECTOMY AND SPLENECTOMY:  - Intraductal papillary mucinous neoplasm (IPMN), low grade, gastric phenotype, side branch type, measuring 1.7cm and extending to the pancreatic parenchymal margin.   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  - No high grade dysplasia or invasion is seen (cyst entirely submitted for histologic examination).  - Unremarkable spleen.  - Multiple reactive lymph nodes.    C. PERITONEAL NODULE, RESECTION:  - Adipose tissue with foreign body type material.      Diagnosis Comment       Part B: PAS+ and PASD- globules are seen with the serous  cystadenoma component. Inhibin, ER and PR immunostains are negative for ovarian-type stroma.     Part C:  AE1-AE3 immunostain is negative for infiltrating cells.  Congo red and Mucicarmine special stains are negative.  Trichrome stain shows extensive fibrosis.      The case has been reviewed at the cytology consensus conference. Select slides were reviewed with Dr Lance Morin.        Microscopic Description       Performed.      Gross Description       A: Other (Specify Site in comments)  Part A is received in formalin, labeled "[name], [name]" and "Prior mesh". It consists of a 9.3 x 3.3 x 2.2 cm segment of purple-tan, firm synthetic material, consistent with a mesh.  There is a 4.5 x 2.3 x 1.0 cm aggregate of loosely attached yellow-tan, lobulated adipose tissue.  Sectioning reveals yellow-tan, soft, lobulated and glistening cut surfaces.  Representative sections of the adipose tissue are submitted in cassette A1.  B: Pancreas  Part B is received in formalin, labeled "[name], [name]" and "Distal pancreas and spleen ". It consists of a distal pancreatectomy and splenectomy specimen to include a 11.1 x 6.7 x 5.6 cm pancreas with a stapled pancreatic margin, a 108 g, 11.7 x 7.1 x 3.7 cm spleen with a 10.5 cm in length by 0.6 cm in diameter segment of splenic vasculature with stapled margins, and a 9.5 x 6.7 x 2.8 cm portion of perisplenic and peripancreatic adipose tissue.  The outer surface of the spleen is yellow-tan and cystic.  The proximal pancreatic margin is inked orange, the anterior pancreatic surface is inked blue, and the posterior pancreatic surface is inked black.  The main pancreatic duct is partially probe patent, and the pancreas is bivalved to reveal a 7.1 x 6.0 x 4.4 cm multiloculated cyst filled with clear, mucinous fluid that involves the main pancreatic duct, extends to the anterior and posterior surface, and is 3.2 cm from the proximal pancreatic margin.  The remainder of the pancreatic  parenchyma is yellow-tan, rubbery, lobulated and glistening.  The splenic capsule is purple-tan, smooth and glistening and is intact.  The spleen is sectioned to reveal a 2.2 x 1.3 x 0.5 cm subcapsular area of hemorrhage.  The remainder of the cut surface is red-brown, rubbery, smooth and glistening with focal areas of white pulp.  Sectioning through the fat reveals a 0.6 x 0.2 x 0.2 cm white-tan to yellow-tan, partially calcified nodule.  Eighteen possible  lymph nodes are identified and range from 0.2-0.8 cm in greatest dimension.  Representative sections, to include entire main pancreatic duct and approximately 30% of the cyst, are submitted as follows:  B1:  Proximal pancreatic margin, en face  B2:  Splenic vascular margins, en face  B3-B7:  Entirety of main pancreatic duct, sequentially submitted  B8-B9:  Cyst to anterior surface  B10-B11:  Cyst to posterior surface  B12-B16:  Cyst to uninvolved  B17-B20:  Cyst  B21:  Uninvolved pancreas  B22:  Splenic area of hemorrhage  B23:  Uninvolved spleen  B24:  Hardened nodule  B25-B27:  6 whole lymph nodes in each cassette  C: Peritoneum  Part C is received in formalin, labeled "[name], [name]" and "Peritoneum nodule". It consists of a 3.2 x 1.5 x 0.3 cm portion of yellow-tan, lobulated adipose tissue.  Sectioning reveals a 1.7 x 0.6 x 0.3 cm white-tan, markedly hardened nodule.  The specimen is entirely submitted as follows:  C1:  Entirety of nodule, following decalcification  C2: Remainder of adipose tissue     Last Pathology/Cytology Result   CYTOPATHOLOGY, FINE NEEDLE ASPIRATE (Collected: 02/08/2021 12:58 PM)   Result Value Ref Range    Interpretation       A. Pancreatic cyst, tail, endoscopic ultrasound-guided fine needle aspiration, cytology with cell block:  Satisfactory for evaluation.  NEOPLASTIC: OTHER  Mucinous cyst fluid with low-grade atypia, see comment.         Comments       Patient's history of a large multicystic mass in the distal pancreatic  body-tail is noted. Smears are essentially acellular with scant possible wispy mucin. Cell block shows few strips of mucinous epithelium with low-grade atypia. Cyst fluid CEA and amylase are not available. PancreaSeq is pending. Case reviewed at Cytology Consensus Conference.           Gross Description       A: Pancreatic Cyst  Specimen consist of  <0.5 mL of cloudy, red fluid without fixative.   A cell block and 2 smears were prepared.               Assessment and Plan    ICD-10-CM    1. Iron deficiency anemia, unspecified iron deficiency anemia type  D50.9 BLOOD CELL COUNT W/DIFF - CANCER CENTER     FERRITIN      2. Jaw pain  R68.84 Refer to CCM Ronney Lion) ENT, [facility]            77 y.o. male with history of right-sided tibial vein thrombosis and PEs both of which were provoked due to surgery. Eliquis was d/c at last appointment.     Patient seen today for follow up. His HGB has increased to 13.2. Advised patient to continue to take oral iron and folate.   Advised patient to follow up with his PCP for his lower extremity weakness and SOB    Will place referral to ENT for pain in his jaw and naproxen for the pain.      Patient will have repeat labs in 6 months and follow up after.    Return visit as scheduled.      I am scribing for, and in the presence of, Dr. Milinda Cave for services provided on 04/21/2022.  Michiel Cowboy, SCRIBE   [place], [place]     I personally performed the services described in this documentation, as scribed  in my presence, and it is both accurate  and complete.  Milinda Cave, MD    This note was partially generated using MModal Fluency Direct system, and there may be some incorrect words, spellings, and punctuation that were not noted in checking the note before saving.

## 2022-04-21 NOTE — Nursing Note (Signed)
ENT referral; RTC in 6 months with labs CBC and ferritin

## 2022-04-22 ENCOUNTER — Ambulatory Visit (HOSPITAL_BASED_OUTPATIENT_CLINIC_OR_DEPARTMENT_OTHER): Payer: 59

## 2022-04-22 DIAGNOSIS — R0602 Shortness of breath: Secondary | ICD-10-CM

## 2022-04-22 DIAGNOSIS — R531 Weakness: Secondary | ICD-10-CM

## 2022-04-22 LAB — ECG 12 LEAD
Atrial Rate: 91 {beats}/min
Calculated P Axis: 54 degrees
Calculated R Axis: 13 degrees
Calculated T Axis: 33 degrees
PR Interval: 136 ms
QRS Duration: 78 ms
QT Interval: 358 ms
QTC Calculation: 440 ms
Ventricular rate: 91 {beats}/min

## 2022-04-25 ENCOUNTER — Other Ambulatory Visit: Payer: Self-pay

## 2022-04-25 ENCOUNTER — Ambulatory Visit: Payer: 59 | Attending: FAMILY PRACTICE

## 2022-04-25 DIAGNOSIS — E119 Type 2 diabetes mellitus without complications: Secondary | ICD-10-CM | POA: Insufficient documentation

## 2022-04-25 NOTE — Progress Notes (Signed)
DIETICIAN SERVICES, MEDICAL OFFICE BUILDING C  [address]  [place] [id number]  Operated by [facility]     Name: [name] MRN:  [id number]   Date: 04/25/2022 Age: 77 y.o.      Assessment: Pt is here per Dr. Colonel Bald for 3rd session due to uncontrolled T2DM with HbA1c 7.8%- 11/12/21. Metformin causing nausea. 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 He reports having a hard time trying new foods. After initial diabetic diagnosis patient threw out all sweets and limits his sweet tea to once every 24hr.     Current Visit: Patient has been hospitalized with 16lb weight loss from prior vist. He has generalized weakness, loss of tasta, and poor appetite. He reports his tasta and appetite have returned a bit in the past few days after his wife stated "You haven't been eating enough". Food tastes like cardboard. His biggest concern is his blood sugar has been spiking. Has been craving regular Gatorade and under-armor which may account for higher blood sugar readings.     B- Carnation Instant Breakfast Drink with 2% milk,  oatmeal with sugar-free creamer in coffee or regular flavored oatmeal, shredded wheat  Blood Sugar- 177mg /dL Glucose in office  L-  PB & J sandwich  D- Eats Out- 1x weekly [employer] or 1/2 rack spare ribs, potatoes, coleslaw, [employer]- fish entree or pot roast, potatoes, and carrot dish on weekends (400mg /dL glucose after [employer])  S- peanuts or apple  Beverages: unsweet tea, 2 16oz. Water bottles/day, regular Gatorade and body armor  Golfs 4x weekly for 5hours. Goes to gym 3x weekly.     Vitals:    04/25/22 1207   Weight: 64.9 kg (143 lb)   Height: 1.778 m (5\' 10" )   BMI: 20.56      Labs:  Lab Results   Component Value Date    HA1C 9.3 (H) 03/17/2021      No  results found for: "POCTHA1C"   CHOLESTEROL   Date Value Ref Range Status   02/02/2022 177 0 - 199 mg/dL Final     HDL CHOL   Date Value Ref Range Status   02/02/2022 36 (L) 40 - 60 mg/dL Final     LDL CALC   Date Value Ref Range Status   02/02/2022 77 0 - 130 mg/dL Final     TRIGLYCERIDES   Date Value Ref Range Status   02/02/2022 318 (H) 0 - 149 mg/dL Final     VLDL CALC   Date Value Ref Range Status   02/02/2022 64 mg/dL Final     CHOL/HDL RATIO   Date Value Ref Range Status   09/06/2021 4.8  Final     NON-HDL   Date Value Ref Range Status   09/06/2021 127 <=190 mg/dL Final       Estimated Nutrient Needs:  IBW: 166lb (75kg)          Calories: 1875      kcal per day (  25  kcal/    75    kg   IBW    )  Protein: 75-90g/day (1.0-1.2g / 75kg IBW)    Nutrition Diagnosis Altered nutrition related lab values related to uncontrolled T2DM as evidenced by HbA1c 7.8%- 11/12/21.    Intervention:  Discussion time began with patient at  11:27am  and ended at 12:04am  for at total of   17min.       Education focused on building out an individualized meal plan of 6 small high protein/calorie meal ideas with encouragement to sip on whole milk. He does not enjoy dry milk powder. We discussed alternative beverages to prevent high blood sugar spikes. We discussed the Fit To Thrive Program at the [facility] for guided exercise.     Handouts with discussion:   Protein Tracker    Monitor/Evaluation:     Patient's has had generalized weakness, low appetie, and taste changes with hospitalization since prior visit. His diet has been sporadic with blood sugar spikes in th 300-400mg /dL glucose with addition of Gatorade and higher carbohydrate meals he can tolerate.     Personal Goals:   1) Choose whole milk for weight gain.   2) Use Food log to monitor- time, food, amount, and blood sugar "check 2hr. After a meal" adjust meal if blood sugar is aboce 180mg /dL. Call with questions.   3) Taste test sugar-free Gatorade and  sugar-free under armour.     Provided RD contact information.    Summer F/u: meal build- example day, healthful recipe packet, monitor weight with Glucerna    [name], RD    Nursing Notes:   [name], [place]  04/25/22 1209  Signed     04/25/22 1200   Nutrition Management    State most important reason to use a meal plan 2 - Needs Review/Assistance   State how meal spacing helps blood sugar control 2 - Needs Review/Assistance   State how composition of meals affect blood sugar 2 - Needs Review/Assistance   Describe how to keep a food diary 2 - Needs Review/Assistance   State how monitoring amount of food can help reach blood sugar levels 2 - Needs Review/Assistance   Describe personal meal plan 2 - Needs Review/Assistance   Use meal plan to plan meals 2 - Needs Review/Assistance   Use meal plan when eating away from home 2 - Needs Review/Assistance   Identify behaviors to help control weight 2 - Needs Review/Assistance   Explain how alcohol affects blood sugar 1 - Needs Instruction   Use food labels to choose food 2 - Needs Review/Assistance   Discuss benefits/cons of fiber 2 - Needs Review/Assistance   Plan eating/behavior changes working toward personal goals 2 - Needs Review/Assistance   Identify behaviors to help reduce risk of heart disease 1 - Needs Instruction

## 2022-04-25 NOTE — Nursing Note (Signed)
04/25/22 1200   Nutrition Management    State most important reason to use a meal plan 2 - Needs Review/Assistance   State how meal spacing helps blood sugar control 2 - Needs Review/Assistance   State how composition of meals affect blood sugar 2 - Needs Review/Assistance   Describe how to keep a food diary 2 - Needs Review/Assistance   State how monitoring amount of food can help reach blood sugar levels 2 - Needs Review/Assistance   Describe personal meal plan 2 - Needs Review/Assistance   Use meal plan to plan meals 2 - Needs Review/Assistance   Use meal plan when eating away from home 2 - Needs Review/Assistance   Identify behaviors to help control weight 2 - Needs Review/Assistance   Explain how alcohol affects blood sugar 1 - Needs Instruction   Use food labels to choose food 2 - Needs Review/Assistance   Discuss benefits/cons of fiber 2 - Needs Review/Assistance   Plan eating/behavior changes working toward personal goals 2 - Needs Review/Assistance   Identify behaviors to help reduce risk of heart disease 1 - Needs Instruction

## 2022-04-26 LAB — ADULT ROUTINE BLOOD CULTURE, SET OF 2 BOTTLES (BACTERIA AND YEAST)
BLOOD CULTURE, ROUTINE: NO GROWTH
BLOOD CULTURE, ROUTINE: NO GROWTH

## 2022-05-03 LAB — ECG 12 LEAD
Atrial Rate: 87 {beats}/min
Calculated P Axis: 50 degrees
Calculated R Axis: 19 degrees
Calculated T Axis: 35 degrees
PR Interval: 132 ms
QRS Duration: 76 ms
QT Interval: 352 ms
QTC Calculation: 423 ms
Ventricular rate: 87 {beats}/min

## 2022-05-04 ENCOUNTER — Encounter (HOSPITAL_COMMUNITY): Payer: Self-pay | Admitting: HOSPITALIST

## 2022-05-04 ENCOUNTER — Other Ambulatory Visit: Payer: Self-pay

## 2022-05-04 ENCOUNTER — Emergency Department (HOSPITAL_COMMUNITY): Payer: 59

## 2022-05-04 ENCOUNTER — Inpatient Hospital Stay
Admission: AC | Admit: 2022-05-04 | Discharge: 2022-05-05 | DRG: 641 | Disposition: A | Discharge status: Other Acute Inpatient Hospital | Payer: 59 | Attending: Internal Medicine | Admitting: Internal Medicine

## 2022-05-04 ENCOUNTER — Encounter (HOSPITAL_BASED_OUTPATIENT_CLINIC_OR_DEPARTMENT_OTHER): Payer: Self-pay | Admitting: Internal Medicine

## 2022-05-04 DIAGNOSIS — Z794 Long term (current) use of insulin: Secondary | ICD-10-CM

## 2022-05-04 DIAGNOSIS — R55 Syncope and collapse: Secondary | ICD-10-CM

## 2022-05-04 DIAGNOSIS — I480 Paroxysmal atrial fibrillation: Secondary | ICD-10-CM | POA: Diagnosis present

## 2022-05-04 DIAGNOSIS — E785 Hyperlipidemia, unspecified: Secondary | ICD-10-CM

## 2022-05-04 DIAGNOSIS — Z21 Asymptomatic human immunodeficiency virus [HIV] infection status: Secondary | ICD-10-CM | POA: Diagnosis present

## 2022-05-04 DIAGNOSIS — R531 Weakness: Secondary | ICD-10-CM

## 2022-05-04 DIAGNOSIS — E039 Hypothyroidism, unspecified: Secondary | ICD-10-CM | POA: Diagnosis present

## 2022-05-04 DIAGNOSIS — K5903 Drug induced constipation: Secondary | ICD-10-CM | POA: Diagnosis present

## 2022-05-04 DIAGNOSIS — K219 Gastro-esophageal reflux disease without esophagitis: Secondary | ICD-10-CM | POA: Diagnosis present

## 2022-05-04 DIAGNOSIS — K869 Disease of pancreas, unspecified: Secondary | ICD-10-CM | POA: Diagnosis present

## 2022-05-04 DIAGNOSIS — Z86711 Personal history of pulmonary embolism: Secondary | ICD-10-CM

## 2022-05-04 DIAGNOSIS — R296 Repeated falls: Secondary | ICD-10-CM | POA: Diagnosis present

## 2022-05-04 DIAGNOSIS — Z136 Encounter for screening for cardiovascular disorders: Secondary | ICD-10-CM

## 2022-05-04 DIAGNOSIS — Y92002 Bathroom of unspecified non-institutional (private) residence single-family (private) house as the place of occurrence of the external cause: Secondary | ICD-10-CM

## 2022-05-04 DIAGNOSIS — Z9181 History of falling: Secondary | ICD-10-CM

## 2022-05-04 DIAGNOSIS — Z9081 Acquired absence of spleen: Secondary | ICD-10-CM

## 2022-05-04 DIAGNOSIS — I119 Hypertensive heart disease without heart failure: Secondary | ICD-10-CM | POA: Diagnosis present

## 2022-05-04 DIAGNOSIS — B029 Zoster without complications: Secondary | ICD-10-CM | POA: Diagnosis present

## 2022-05-04 DIAGNOSIS — S0081XA Abrasion of other part of head, initial encounter: Secondary | ICD-10-CM

## 2022-05-04 DIAGNOSIS — Z8614 Personal history of Methicillin resistant Staphylococcus aureus infection: Secondary | ICD-10-CM

## 2022-05-04 DIAGNOSIS — N179 Acute kidney failure, unspecified: Secondary | ICD-10-CM | POA: Diagnosis present

## 2022-05-04 DIAGNOSIS — G4700 Insomnia, unspecified: Secondary | ICD-10-CM | POA: Diagnosis present

## 2022-05-04 DIAGNOSIS — H9190 Unspecified hearing loss, unspecified ear: Secondary | ICD-10-CM | POA: Diagnosis present

## 2022-05-04 DIAGNOSIS — I10 Essential (primary) hypertension: Secondary | ICD-10-CM | POA: Diagnosis present

## 2022-05-04 DIAGNOSIS — Z7984 Long term (current) use of oral hypoglycemic drugs: Secondary | ICD-10-CM

## 2022-05-04 DIAGNOSIS — Z79890 Hormone replacement therapy (postmenopausal): Secondary | ICD-10-CM

## 2022-05-04 DIAGNOSIS — K8689 Other specified diseases of pancreas: Secondary | ICD-10-CM | POA: Diagnosis present

## 2022-05-04 DIAGNOSIS — Z86718 Personal history of other venous thrombosis and embolism: Secondary | ICD-10-CM

## 2022-05-04 DIAGNOSIS — E782 Mixed hyperlipidemia: Secondary | ICD-10-CM | POA: Diagnosis present

## 2022-05-04 DIAGNOSIS — R9431 Abnormal electrocardiogram [ECG] [EKG]: Secondary | ICD-10-CM

## 2022-05-04 DIAGNOSIS — K5900 Constipation, unspecified: Secondary | ICD-10-CM

## 2022-05-04 DIAGNOSIS — E119 Type 2 diabetes mellitus without complications: Secondary | ICD-10-CM | POA: Diagnosis present

## 2022-05-04 DIAGNOSIS — Z8546 Personal history of malignant neoplasm of prostate: Secondary | ICD-10-CM

## 2022-05-04 DIAGNOSIS — W1839XA Other fall on same level, initial encounter: Secondary | ICD-10-CM

## 2022-05-04 DIAGNOSIS — T40605A Adverse effect of unspecified narcotics, initial encounter: Secondary | ICD-10-CM | POA: Diagnosis present

## 2022-05-04 LAB — CBC WITH DIFF
BASOPHIL #: <0.1 10*3/uL (ref <=0.20)
BASOPHIL %: 0 %
EOSINOPHIL #: <0.1 10*3/uL (ref <=0.50)
EOSINOPHIL %: 0 %
HCT: 38.8 % — ABNORMAL LOW (ref 38.9–52.0)
HGB: 12.4 g/dL — ABNORMAL LOW (ref 13.4–17.5)
IMMATURE GRANULOCYTE #: 0.33 10*3/uL — ABNORMAL HIGH (ref <0.10)
IMMATURE GRANULOCYTE %: 3 % — ABNORMAL HIGH (ref 0.0–1.0)
LYMPHOCYTE #: 2.16 10*3/uL (ref 1.00–4.80)
LYMPHOCYTE %: 20 %
MCH: 26.8 pg (ref 26.0–32.0)
MCHC: 32 g/dL (ref 31.0–35.5)
MCV: 84 fL (ref 78.0–100.0)
MONOCYTE #: 0.81 10*3/uL (ref 0.20–1.10)
MONOCYTE %: 7 %
MPV: 11.3 fL (ref 8.7–12.5)
NEUTROPHIL #: 7.53 10*3/uL (ref 1.50–7.70)
NEUTROPHIL %: 70 %
PLATELETS: 302 10*3/uL (ref 150–400)
RBC: 4.62 10*6/uL (ref 4.50–6.10)
RDW-CV: 26.7 % — ABNORMAL HIGH (ref 11.5–15.5)
WBC: 10.9 10*3/uL (ref 3.7–11.0)

## 2022-05-04 LAB — IONIZED CALCIUM WITH PH
IONIZED CALCIUM: 1.83 mmol/L (ref 1.15–1.33)
PH (VENOUS): 7.48 — ABNORMAL HIGH (ref 7.32–7.43)

## 2022-05-04 LAB — DRUG SCREEN, WITH CONFIRMATION, URINE
AMPHETAMINES, URINE: NEGATIVE
BARBITURATES URINE: NEGATIVE
BENZODIAZEPINES URINE: NEGATIVE
BUPRENORPHINE URINE: NEGATIVE
CANNABINOIDS URINE: NEGATIVE
COCAINE METABOLITES URINE: NEGATIVE
CREATININE RANDOM URINE: 27 mg/dL (ref >=20)
ECSTASY/MDMA URINE: NEGATIVE
FENTANYL, RANDOM URINE: NEGATIVE
METHADONE URINE: NEGATIVE
OPIATES URINE (LOW CUTOFF): NEGATIVE
OXYCODONE URINE: POSITIVE — AB

## 2022-05-04 LAB — URINALYSIS, MACROSCOPIC
BILIRUBIN: NOT DETECTED mg/dL
BLOOD: NOT DETECTED mg/dL
KETONES: NOT DETECTED mg/dL
LEUKOCYTES: NOT DETECTED WBCs/uL
NITRITE: NOT DETECTED
PH: 7 (ref 5.0–?)
PROTEIN: NOT DETECTED mg/dL
SPECIFIC GRAVITY: 1.01 (ref 1.005–?)
UROBILINOGEN: NOT DETECTED mg/dL

## 2022-05-04 LAB — URINALYSIS, MICROSCOPIC

## 2022-05-04 LAB — ECG 12 LEAD
Atrial Rate: 98 {beats}/min
Calculated P Axis: 38 degrees
Calculated R Axis: -5 degrees
Calculated T Axis: 34 degrees
PR Interval: 144 ms
QRS Duration: 76 ms
QT Interval: 352 ms
QTC Calculation: 449 ms
Ventricular rate: 98 {beats}/min

## 2022-05-04 LAB — TYPE AND SCREEN
ABO/RH(D): A NEG
ANTIBODY SCREEN: NEGATIVE

## 2022-05-04 LAB — COMPREHENSIVE METABOLIC PANEL, NON-FASTING
ALBUMIN: 2.5 g/dL — ABNORMAL LOW (ref 3.4–4.8)
ALKALINE PHOSPHATASE: 89 U/L (ref 45–115)
ALT (SGPT): 31 U/L (ref 10–55)
ANION GAP: 9 mmol/L (ref 4–13)
AST (SGOT): 24 U/L (ref 8–45)
BILIRUBIN TOTAL: 0.4 mg/dL (ref 0.3–1.3)
BUN/CREA RATIO: 19 (ref 6–22)
BUN: 34 mg/dL — ABNORMAL HIGH (ref 8–25)
CALCIUM: 13.2 mg/dL (ref 8.6–10.3)
CHLORIDE: 93 mmol/L — ABNORMAL LOW (ref 96–111)
CO2 TOTAL: 32 mmol/L — ABNORMAL HIGH (ref 23–31)
CREATININE: 1.77 mg/dL — ABNORMAL HIGH (ref 0.75–1.35)
ESTIMATED GFR - MALE: 39 mL/min/BSA — ABNORMAL LOW (ref >=60)
GLUCOSE: 148 mg/dL — ABNORMAL HIGH (ref 65–125)
POTASSIUM: 4.1 mmol/L (ref 3.5–5.1)
PROTEIN TOTAL: 6.5 g/dL (ref 6.0–8.0)
SODIUM: 134 mmol/L — ABNORMAL LOW (ref 136–145)

## 2022-05-04 LAB — PT/INR
INR: 0.97 (ref <=5.00)
PROTHROMBIN TIME: 11.5 seconds (ref 9.7–13.6)

## 2022-05-04 LAB — PTT (PARTIAL THROMBOPLASTIN TIME): APTT: 24.2 seconds — ABNORMAL LOW (ref 26.0–39.0)

## 2022-05-04 LAB — VITAMIN D 25 TOTAL: VITAMIN D 25, TOTAL: 31 ng/mL (ref 30.0–100.0)

## 2022-05-04 LAB — PHOSPHORUS: PHOSPHORUS: 3.4 mg/dL (ref 2.3–4.0)

## 2022-05-04 LAB — PARATHYROID HORMONE (PTH): PTH: 5.8 pg/mL — ABNORMAL LOW (ref 8.5–77.0)

## 2022-05-04 LAB — ETHANOL, SERUM/PLASMA
ETHANOL: <10 mg/dL (ref <10)
ETHANOL: NOT DETECTED

## 2022-05-04 MED ORDER — SODIUM CHLORIDE 0.9 % (FLUSH) INJECTION SYRINGE
3.0000 mL | INJECTION | INTRAMUSCULAR | Status: DC | PRN
Start: 2022-05-04 — End: 2022-05-05

## 2022-05-04 MED ORDER — MAGNESIUM HYDROXIDE 400 MG/5 ML ORAL SUSPENSION
15.0000 mL | Freq: Every day | ORAL | Status: DC | PRN
Start: 2022-05-04 — End: 2022-05-05
  Administered 2022-05-05: 1200 mg via ORAL
  Filled 2022-05-04: qty 30

## 2022-05-04 MED ORDER — ONDANSETRON HCL (PF) 4 MG/2 ML INJECTION SOLUTION
4.0000 mg | Freq: Four times a day (QID) | INTRAMUSCULAR | Status: DC | PRN
Start: 2022-05-04 — End: 2022-05-05
  Administered 2022-05-05: 4 mg via INTRAVENOUS
  Filled 2022-05-04: qty 2

## 2022-05-04 MED ORDER — CALCITONIN (SALMON) 200 UNIT/ML INJECTION SOLUTION
4.0000 [IU]/kg | Freq: Once | INTRAMUSCULAR | Status: AC
Start: 2022-05-05 — End: 2022-05-05
  Administered 2022-05-05: 250 [IU] via SUBCUTANEOUS
  Filled 2022-05-04: qty 2

## 2022-05-04 MED ORDER — INSULIN GLARGINE-YFGN (U-100) 100 UNIT/ML (3 ML) SUBCUTANEOUS PEN
8.0000 [IU] | PEN_INJECTOR | Freq: Every evening | SUBCUTANEOUS | Status: DC
Start: 2022-05-05 — End: 2022-05-05
  Administered 2022-05-05: 8 [IU] via SUBCUTANEOUS
  Filled 2022-05-04: qty 3

## 2022-05-04 MED ORDER — SODIUM CHLORIDE 0.9 % (FLUSH) INJECTION SYRINGE
3.0000 mL | INJECTION | Freq: Three times a day (TID) | INTRAMUSCULAR | Status: DC
Start: 2022-05-05 — End: 2022-05-05
  Administered 2022-05-05 (×2): 0 mL
  Administered 2022-05-05: 3 mL

## 2022-05-04 MED ORDER — AMLODIPINE 10 MG TABLET
10.0000 mg | ORAL_TABLET | Freq: Every day | ORAL | Status: DC
Start: 2022-05-05 — End: 2022-05-05
  Administered 2022-05-05: 0 mg via ORAL

## 2022-05-04 MED ORDER — GLIPIZIDE 5 MG TABLET
5.0000 mg | ORAL_TABLET | Freq: Two times a day (BID) | ORAL | Status: DC
Start: 2022-05-05 — End: 2022-05-05
  Administered 2022-05-05: 0 mg via ORAL
  Administered 2022-05-05: 5 mg via ORAL
  Filled 2022-05-04 (×3): qty 1

## 2022-05-04 MED ORDER — DEXTROSE 50 % IN WATER (D50W) INTRAVENOUS SYRINGE
12.5000 g | INJECTION | INTRAVENOUS | Status: DC | PRN
Start: 2022-05-04 — End: 2022-05-05

## 2022-05-04 MED ORDER — INSULIN LISPRO 100 UNIT/ML SUB-Q SSIP PEN
0.0000 [IU] | INJECTION | Freq: Four times a day (QID) | SUBCUTANEOUS | Status: DC
Start: 2022-05-05 — End: 2022-05-05
  Administered 2022-05-05 (×3): 0 [IU] via SUBCUTANEOUS
  Filled 2022-05-04: qty 300

## 2022-05-04 MED ORDER — SODIUM CHLORIDE 0.9 % (FLUSH) INJECTION SYRINGE
3.0000 mL | INJECTION | Freq: Three times a day (TID) | INTRAMUSCULAR | Status: DC
Start: 2022-05-04 — End: 2022-05-05
  Administered 2022-05-04 – 2022-05-05 (×3): 0 mL

## 2022-05-04 MED ORDER — LEVOTHYROXINE 75 MCG TABLET
75.0000 ug | ORAL_TABLET | Freq: Every morning | ORAL | Status: DC
Start: 2022-05-05 — End: 2022-05-05
  Administered 2022-05-05: 0 ug via ORAL

## 2022-05-04 MED ORDER — SODIUM CHLORIDE 0.9 % INTRAVENOUS SOLUTION
INTRAVENOUS | Status: DC
Start: 2022-05-05 — End: 2022-05-05

## 2022-05-04 MED ORDER — SODIUM CHLORIDE 0.9 % IV BOLUS
1000.0000 mL | INJECTION | Status: AC
Start: 2022-05-04 — End: 2022-05-04
  Administered 2022-05-04: 0 mL via INTRAVENOUS
  Administered 2022-05-04: 1000 mL via INTRAVENOUS

## 2022-05-04 MED ORDER — DEXTROSE 40 % ORAL GEL
15.0000 g | ORAL | Status: DC | PRN
Start: 2022-05-04 — End: 2022-05-05

## 2022-05-04 MED ORDER — GLUCAGON HCL 1 MG/ML SOLUTION FOR INJECTION
1.0000 mg | Freq: Once | INTRAMUSCULAR | Status: DC | PRN
Start: 2022-05-04 — End: 2022-05-05

## 2022-05-04 MED ORDER — ELVITEG 150 MG-COB 150 MG-EMTRICIT 200 MG-TENOFO ALAFENAM 10 MG TABLET
1.0000 | ORAL_TABLET | Freq: Every day | ORAL | Status: DC
Start: 2022-05-05 — End: 2022-05-05
  Administered 2022-05-05: 0 via ORAL

## 2022-05-04 MED ORDER — MIRTAZAPINE 15 MG TABLET
7.5000 mg | ORAL_TABLET | Freq: Every evening | ORAL | Status: DC
Start: 2022-05-05 — End: 2022-05-05
  Administered 2022-05-05: 7.5 mg via ORAL
  Filled 2022-05-04: qty 1

## 2022-05-04 MED ORDER — GABAPENTIN 300 MG CAPSULE
300.0000 mg | ORAL_CAPSULE | Freq: Three times a day (TID) | ORAL | Status: DC
Start: 2022-05-05 — End: 2022-05-05
  Administered 2022-05-05 (×3): 300 mg via ORAL
  Filled 2022-05-04 (×3): qty 1

## 2022-05-04 MED ORDER — ACETAMINOPHEN 500 MG TABLET
500.0000 mg | ORAL_TABLET | ORAL | Status: DC | PRN
Start: 2022-05-04 — End: 2022-05-05
  Administered 2022-05-05: 500 mg via ORAL
  Filled 2022-05-04: qty 1

## 2022-05-04 MED ORDER — PANTOPRAZOLE 40 MG TABLET,DELAYED RELEASE
40.0000 mg | DELAYED_RELEASE_TABLET | Freq: Every day | ORAL | Status: DC
Start: 2022-05-05 — End: 2022-05-05
  Administered 2022-05-05: 0 mg via ORAL

## 2022-05-04 MED ORDER — ATORVASTATIN 40 MG TABLET
40.0000 mg | ORAL_TABLET | Freq: Every morning | ORAL | Status: DC
Start: 2022-05-05 — End: 2022-05-05
  Administered 2022-05-05: 0 mg via ORAL

## 2022-05-04 NOTE — ED Provider Notes (Signed)
Emergency Department  Provider Note  HPI - 05/04/2022    COVID-19 PANDEMIC IN EFFECT    Recent [id number] history, if applicable:  Lab Results   Component Value Date    COVID19PCR Not Detected 04/21/2022    COVID19PCR Not Detected 04/18/2022    COVID19PCR Not Detected 03/31/2021     History and Physical Exam     [name] 77 y.o. male  Date of Birth: [birth year]     Attending: Dr. Daphane Shepherd    PCP: [facility]      HPI:    Visit Reason:   Chief Complaint   Patient presents with    Priority 2 Trauma       Entered the patient's room for initial exam at 1902 hours.    Arrival: The patient arrived by ambulance and is accompanied by family members.   History Limitations: none    [name] is a 77 y.o. male with a PMHx of hypothyroidism, HTN, DVT, HLD, CA, PE, thyroid disorder, colon surgery, hernia repair, colostomy reversal, laparotomy, pancreatectomy, and splenectomy. Patient presents to the ED today for priority II trauma. Pt reports he experienced three falls while in the bathroom today. He voices on his first fall, he was using the restroom and fell face first into the toilet. He states that he hit his head, but denies any LOC. Pt is currently administering anticoagulants (Eliquis). Pt fell twice backwards while trying to use th restroom after getting up from his first fall. Pt states his "legs just keep giving out underneath me." Pt states having sxs of weakness, tremors, and urinary incontinence that started today. He denies any associated chest pain, SOB, abdominal pain, or n/v/d. No further complaints or associated symptoms are stated. Pain is stated to be continuous and rated at 8/10.    Patient has not had any recent fevers or known sick exposures. Patient denies having any head pain, neck pain, back pain, chest pain, cough, SOB, abdominal pain, or n/v/d.Patient does not have any other associated s/s at this time.    There are no other complaints or concerns noted at this  time.    Review of Systems:    Constitutional: (+) Tremors, no fever, chills  Skin: (-) rashes, lesions  HENT: (-) sore throat, ear pain, difficulty swallowing  Eyes: (-) vision changes, redness, discharge  Cardio: (-) chest pain, (-) palpitations   Respiratory: (-) cough, wheezing, SOB  GI:  (-) nausea, vomiting, diarrhea, constipation, abdominal pain  GU:  (+) Urinary incontinence, no dysuria, hematuria, polyuria  MSK: (-) joint pain, neck pain, back pain  Neuro: (+) Weakness, no numbness, tingling, headache  Psych: (-) SI, HI, anxiety, depression.  All other systems reviewed and are negative, unless commented on in the HPI.     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     HTN (hypertension)       Human immunodeficiency virus (HIV) disease (CMS [facility])       Hyperlipidemia  "borderline"     Hypothyroidism  MRSA (methicillin resistant staph aureus) culture positive 01/02/2021   MRSA left groin abscess 01/03/21     MRSA (methicillin resistant staph aureus) culture positive 01/02/2021   MRSA blood 01/02/21     Pulmonary embolism (CMS HCC) 03/31/2021      Thyroid disorder       Wears glasses          Allergies:    No Known Allergies  Social History  Social History     Tobacco Use    Smoking status: Never    Smokeless tobacco: Never   Vaping Use    Vaping status: Never Used   Substance Use Topics    Alcohol use: Not Currently    Drug use: Never     Family History  Family Medical History:       Problem Relation (Age of Onset)    Cancer Mother, Father, Other    Heart Attack Mother    High Cholesterol Other    Stroke Other           Home Meds:     Current Facility-Administered Medications:     acetaminophen (TYLENOL) tablet, 500 mg, Oral, Q4H PRN, Ophelia Charter, MD    amLODIPine (NORVASC) tablet, 10 mg, Oral, Daily, Ophelia Charter, MD    atorvastatin (LIPITOR) tablet, 40 mg, Oral, Daily with Breakfast, Ophelia Charter, MD    Correction/SSIP insulin lispro (HumaLOG) 100 units/mL injection pen, 0-18 Units, Subcutaneous, 4x/day AC, Ophelia Charter, MD    dextrose (GLUTOSE) 40% oral gel, 15 g, Oral, Q15 Min PRN, Ophelia Charter, MD    dextrose 50% (0.5 g/mL) injection - syringe, 12.5 g, Intravenous, Q15 Min PRN, Ophelia Charter, MD    elvitegravir-cobicistat-emtricitrabine-tenofovir (GENVOYA) 150-150-200-10 mg per tablet, 1 Tablet, Oral, Daily, Ophelia Charter, MD    gabapentin (NEURONTIN) capsule, 300 mg, Oral, 3x/day, Ophelia Charter, MD, 300 mg at 05/05/22 0840    glipiZIDE (GLUCOTROL) tablet, 5 mg, Oral, 2x/day AC, Ophelia Charter, MD    glucagon injection 1 mg, 1 mg, IntraMUSCULAR, Once PRN, Ophelia Charter, MD    insulin glargine-yfgn 100 Units/mL SubQ pen, 8 Units, Subcutaneous, NIGHTLY, Ophelia Charter, MD, 8 Units at 05/05/22 0240    levothyroxine (SYNTHROID) tablet, 75 mcg, Oral, QAM, Ophelia Charter, MD    magnesium hydroxide (MILK OF MAGNESIA) 400mg  per 5mL oral liquid, 15 mL, Oral, Daily PRN, Ophelia Charter, MD, 1,200 mg at 05/05/22 0855    mirtazapine (REMERON) tablet, 7.5 mg, Oral, NIGHTLY, Ophelia Charter, MD, 7.5 mg at 05/05/22 0240    NS flush syringe, 3 mL, Intracatheter, Q8HRS, Costella [name], MD    NS flush syringe, 3 mL, Intracatheter, Q1H PRN, Costella [name], MD    NS flush syringe, 3 mL, Intracatheter, Q8HRS, Ophelia Charter, MD, 3 mL at 05/05/22 0241    NS flush syringe, 3 mL, Intracatheter, Q1H PRN, Ophelia Charter, MD    NS premix infusion, , Intravenous, Continuous, Ophelia Charter, MD, Last Rate: 100 mL/hr at 05/05/22 0240, New Bag at 05/05/22 0240    ondansetron (ZOFRAN) 2 mg/mL injection, 4 mg, Intravenous, Q6H PRN, Ophelia Charter, MD, 4 mg at 05/05/22 0840    pantoprazole (PROTONIX) delayed release tablet, 40 mg, Oral, Daily, Ophelia Charter, MD          Exam and Objective Findings     Nursing notes reviewed.  Old records reviewed.    Filed Vitals:    05/04/22 2300 05/04/22 2347 05/05/22 0017 05/05/22 0739   BP: 125/85 126/79 120/76 117/88   Pulse: 89 88  94 (!) 109   Resp: 17 17 17 17    Temp: 37.1 C (98.7 F) 37.1 C (98.7 F) 36.8 C (98.2 F) 36.7 C (98.1 F)   SpO2: 91% 91% 91% 90%       Physical Exam  Nursing note and vitals reviewed.  Vital signs reviewed as above.     Constitutional: Pt is well-developed and well-nourished.   Head: + Abrasion to the middle of the forehead.   Eyes/ Ears/ Throat: Conjunctivae are normal. Pupils are equal, round, and reactive to light. EOM are intact. TMs normal. No pharyngeal erythema.  Neck: Soft, supple, full range of motion.  Cardiovascular: RRR. No Murmurs/rubs/gallops. Distal pulses present and equal bilaterally.  Pulmonary/Chest: Normal BS BL with no distress. No audible wheezes or crackles are noted.  GI/ Abdominal: + Protuberant abdomen. Soft, nontender, nondistended. No rebound, guarding, or masses.  Musculoskeletal: 5/5 muscle strength noted in all extremities with intact sensation. Normal range of motion. No deformities. Exhibits no edema and no tenderness.   Neurological: CNs 2-12 grossly intact. No focal deficits noted.  Skin: Capillary refill < 3 seconds. Warm and dry. No rash or lesions  Psychiatric: Patient has a normal mood and affect.     Work-up:  Orders Placed This Encounter    CT BRAIN WO IV CONTRAST    CT CERVICAL SPINE WO IV CONTRAST    CANCELED: CT CHEST W IV CONTRAST    CANCELED: CT ABDOMEN PELVIS W IV CONTRAST    CT ABDOMEN PELVIS WO IV CONTRAST    CT CHEST WO IV CONTRAST    CBC/DIFF    COMPREHENSIVE METABOLIC PANEL, NON-FASTING    PT/INR    PTT (PARTIAL THROMBOPLASTIN TIME)    ETHANOL, SERUM    DRUG SCREEN, WITH CONFIRMATION, URINE    URINALYSIS, MACROSCOPIC    CBC WITH DIFF    OPIATES CONFIRMATORY/DEFINITIVE, URINE, BY LC-MS/MS (PERFORMABLE)    URINALYSIS, MICROSCOPIC    IONIZED CALCIUM WITH PH    VITAMIN D 25 TOTAL    PHOSPHORUS    PARATHYROID HORMONE  (PTH)    CBC/DIFF    BASIC METABOLIC PANEL, NON-FASTING    MAGNESIUM    PHOSPHORUS    ALBUMIN    CBC WITH DIFF    CANCELED: IONIZED CALCIUM WITH PH    BASIC METABOLIC PANEL    CBC/DIFF    MAGNESIUM    PHOSPHORUS    OT EVALUATE AND TREAT    OT EVALUATE AND TREAT    PT EVALUATE AND TREAT    PT EVALUATE AND TREAT    OXYGEN PROTOCOL    OXYGEN PROTOCOL    ECG 12 LEAD ONE TIME    PERFORM POC ISTAT CREATININE POINT OF CARE    PERFORM POC WHOLE BLOOD GLUCOSE    TYPE AND SCREEN    INSERT & MAINTAIN PERIPHERAL IV ACCESS    PERIPHERAL IV DRESSING CHANGE    INSERT & MAINTAIN PERIPHERAL IV ACCESS    PERIPHERAL IV DRESSING CHANGE    PATIENT CLASS/LEVEL OF CARE DESIGNATION - [facility]    NS flush syringe    NS flush syringe    NS bolus infusion 1,000 mL    mirtazapine (REMERON) tablet    glipiZIDE (GLUCOTROL) tablet    atorvastatin (LIPITOR) tablet    gabapentin (NEURONTIN) capsule    amLODIPine (NORVASC) tablet    insulin glargine-yfgn 100 Units/mL SubQ pen    pantoprazole (PROTONIX) delayed release tablet    levothyroxine (SYNTHROID) tablet    calcitonin-salmon (MIACALCIN)  200 units/mL injection    magnesium hydroxide (MILK OF MAGNESIA)  per 5mL oral liquid    ondansetron (ZOFRAN) 2 mg/mL injection    NS flush syringe    NS flush syringe    acetaminophen (TYLENOL) tablet    Correction/SSIP insulin lispro (HumaLOG) 100 units/mL injection pen    dextrose (GLUTOSE) 40% oral gel    dextrose 50% (0.5 g/mL) injection - syringe    glucagon injection 1 mg    NS premix infusion    elvitegravir-cobicistat-emtricitrabine-tenofovir (GENVOYA) 150-150-200-10 mg per tablet        Labs:  Results for orders placed or performed during the hospital encounter of 05/04/22 (from the past 24 hour(s))   CBC/DIFF    Narrative    The following orders were created for panel order CBC/DIFF.  Procedure                               Abnormality         Status                     ---------                               -----------         ------                      CBC WITH DIFF[[id number]]                Abnormal            Final result                 Please view results for these tests on the individual orders.   COMPREHENSIVE METABOLIC PANEL, NON-FASTING   Result Value Ref Range    SODIUM 134 (L) 136 - 145 mmol/L    POTASSIUM 4.1 3.5 - 5.1 mmol/L    CHLORIDE 93 (L) 96 - 111 mmol/L    CO2 TOTAL 32 (H) 23 - 31 mmol/L    ANION GAP 9 4 - 13 mmol/L    BUN 34 (H) 8 - 25 mg/dL    CREATININE 1.61 (H) 0.75 - 1.35 mg/dL    BUN/CREA RATIO 19 6 - 22    ALBUMIN 2.5 (L) 3.4 - 4.8 g/dL     CALCIUM 09.6 (HH) 8.6 - 10.3 mg/dL    GLUCOSE 045 (H) 65 - 125 mg/dL    ALKALINE PHOSPHATASE 89 45 - 115 U/L    ALT (SGPT) 31 10 - 55 U/L    AST (SGOT)  24 8 - 45 U/L    BILIRUBIN TOTAL 0.4 0.3 - 1.3 mg/dL    PROTEIN TOTAL 6.5 6.0 - 8.0 g/dL    ESTIMATED GFR - MALE 39 (L) >=60 mL/min/BSA   PT/INR   Result Value Ref Range    PROTHROMBIN TIME 11.5 9.7 - 13.6 seconds    INR 0.97 <=5.00   PTT (PARTIAL THROMBOPLASTIN TIME)   Result Value Ref Range    APTT 24.2 (L) 26.0 - 39.0 seconds    Narrative    Therapeutic range for unfractionated heparin is 60-100 seconds.   ETHANOL, SERUM   Result Value Ref Range    ETHANOL None Detected     ETHANOL <10 <10 mg/dL   DRUG SCREEN, WITH CONFIRMATION, URINE   Result  Value Ref Range    AMPHETAMINES, URINE Negative Negative    BARBITURATES URINE Negative Negative    BENZODIAZEPINES URINE Negative Negative    BUPRENORPHINE URINE Negative Negative    CANNABINOIDS URINE Negative Negative    COCAINE METABOLITES URINE Negative Negative    METHADONE URINE Negative Negative    OPIATES URINE (LOW CUTOFF) Negative Negative    OXYCODONE URINE Positive (A) Negative    ECSTASY/MDMA URINE Negative Negative    FENTANYL, RANDOM URINE Negative Negative    CREATININE RANDOM URINE 27 >=20 mg/dL    Narrative    Drug screening results are intended for medical use only and are presumptive/unconfirmed. When unexpected results are obtained, consider definitive testing if it has not been  ordered.     URINALYSIS, MACROSCOPIC   Result Value Ref Range    COLOR Light Yellow Colorless, Straw, Yellow, Light Yellow    APPEARANCE Turbid (A) Clear    SPECIFIC GRAVITY 1.010 >1.005 - <1.030    PH 7.0 >5.0 - <8.0    PROTEIN Not Detected Not Detected mg/dL    GLUCOSE 1+ (A) Not Detected mg/dL    KETONES Not Detected Not Detected mg/dL    UROBILINOGEN Not Detected Not Detected mg/dL    BILIRUBIN Not Detected Not Detected mg/dL    BLOOD Not Detected Not Detected mg/dL    NITRITE Not Detected Not Detected    LEUKOCYTES Not Detected Not Detected WBCs/uL   CBC WITH DIFF   Result Value Ref Range    WBC 10.9 3.7 - 11.0 x10^3/uL    RBC 4.62 4.50 - 6.10 x10^6/uL    HGB 12.4 (L) 13.4 - 17.5 g/dL    HCT 16.1 (L) 09.6 - 52.0 %    MCV 84.0 78.0 - 100.0 fL    MCH 26.8 26.0 - 32.0 pg    MCHC 32.0 31.0 - 35.5 g/dL    RDW-CV 04.5 (H) 40.9 - 15.5 %    PLATELETS 302 150 - 400 x10^3/uL    MPV 11.3 8.7 - 12.5 fL    NEUTROPHIL % 70.0 %    LYMPHOCYTE % 20.0 %    MONOCYTE % 7.0 %    EOSINOPHIL % 0.0 %    BASOPHIL % 0.0 %    NEUTROPHIL # 7.53 1.50 - 7.70 x10^3/uL    LYMPHOCYTE # 2.16 1.00 - 4.80 x10^3/uL    MONOCYTE # 0.81 0.20 - 1.10 x10^3/uL    EOSINOPHIL # <0.10 <=0.50 x10^3/uL    BASOPHIL # <0.10 <=0.20 x10^3/uL    IMMATURE GRANULOCYTE % 3.0 (H) 0.0 - 1.0 %    IMMATURE GRANULOCYTE # 0.33 (H) <0.10 x10^3/uL   URINALYSIS, MICROSCOPIC   Result Value Ref Range    WBCS 0-5 0-5, None /hpf    RBCS 0-5 None, 0-5 /hpf    AMORPHOUS SEDIMENT Rare (A) None, Mod /hpf    HYALINE CASTS 0-5 (A) None /lpf    SQUAMOUS EPITHELIAL Rare (A) None /hpf   IONIZED CALCIUM WITH PH   Result Value Ref Range    PH (VENOUS) 7.48 (H) 7.32 - 7.43    IONIZED CALCIUM 1.83 (HH) 1.15 - 1.33 mmol/L   VITAMIN D 25 TOTAL   Result Value Ref Range    VITAMIN D 25, TOTAL 31.0 30.0 - 100.0 ng/mL   PHOSPHORUS   Result Value Ref Range    PHOSPHORUS 3.4 2.3 - 4.0 mg/dL   PARATHYROID HORMONE (PTH)   Result Value Ref Range    PTH 5.8 (L) 8.5 -  77.0 pg/mL   CBC/DIFF    Narrative     The following orders were created for panel order CBC/DIFF.  Procedure                               Abnormality         Status                     ---------                               -----------         ------                     CBC WITH DIFF[[id number]]                Abnormal            Final result                 Please view results for these tests on the individual orders.   BASIC METABOLIC PANEL, NON-FASTING   Result Value Ref Range    SODIUM 137 136 - 145 mmol/L    POTASSIUM 4.2 3.5 - 5.1 mmol/L    CHLORIDE 97 96 - 111 mmol/L    CO2 TOTAL 29 23 - 31 mmol/L    ANION GAP 11 4 - 13 mmol/L    CALCIUM 11.9 (H) 8.6 - 10.3 mg/dL    GLUCOSE 161 65 - 096 mg/dL    BUN 29 (H) 8 - 25 mg/dL    CREATININE 0.45 (H) 0.75 - 1.35 mg/dL    BUN/CREA RATIO 18 6 - 22    ESTIMATED GFR - MALE 43 (L) >=60 mL/min/BSA   MAGNESIUM   Result Value Ref Range    MAGNESIUM 1.9 1.8 - 2.6 mg/dL   PHOSPHORUS   Result Value Ref Range    PHOSPHORUS 3.2 2.3 - 4.0 mg/dL   ALBUMIN   Result Value Ref Range    ALBUMIN 2.4 (L) 3.4 - 4.8 g/dL    CBC WITH DIFF   Result Value Ref Range    WBC 11.6 (H) 3.7 - 11.0 x10^3/uL    RBC 4.80 4.50 - 6.10 x10^6/uL    HGB 12.9 (L) 13.4 - 17.5 g/dL    HCT 40.9 81.1 - 91.4 %    MCV 84.6 78.0 - 100.0 fL    MCH 26.9 26.0 - 32.0 pg    MCHC 31.8 31.0 - 35.5 g/dL    RDW-CV 78.2 (H) 95.6 - 15.5 %    PLATELETS 305 150 - 400 x10^3/uL    NEUTROPHIL % 73.0 %    LYMPHOCYTE % 18.0 %    MONOCYTE % 7.0 %    EOSINOPHIL % 0.0 %    BASOPHIL % 0.0 %    NEUTROPHIL # 8.35 (H) 1.50 - 7.70 x10^3/uL    LYMPHOCYTE # 2.13 1.00 - 4.80 x10^3/uL    MONOCYTE # 0.77 0.20 - 1.10 x10^3/uL    EOSINOPHIL # <0.10 <=0.50 x10^3/uL    BASOPHIL # <0.10 <=0.20 x10^3/uL    IMMATURE GRANULOCYTE % 2.0 (H) 0.0 - 1.0 %    IMMATURE GRANULOCYTE # 0.25 (H) <0.10 x10^3/uL    Narrative    Mean Platelet Volume - Not Reported   TYPE AND SCREEN   Result Value Ref Range  UNITS ORDERED NOT STATED     ABO/RH(D) A NEGATIVE     ANTIBODY SCREEN NEGATIVE     SPECIMEN  EXPIRATION DATE 05/07/2022,2359    POC BLOOD GLUCOSE (RESULTS)   Result Value Ref Range    GLUCOSE, POC 124 Fasting: 80-130 mg/dL; 2 HR PC: <161 mg/dL mg/dl   POC BLOOD GLUCOSE (RESULTS)   Result Value Ref Range    GLUCOSE, POC 105 Fasting: 80-130 mg/dL; 2 HR PC: <096 mg/dL mg/dl   POC BLOOD GLUCOSE (RESULTS)   Result Value Ref Range    GLUCOSE, POC 113 Fasting: 80-130 mg/dL; 2 HR PC: <045 mg/dL mg/dl       Imaging:   Results for orders placed or performed during the hospital encounter of 05/04/22 (from the past 72 hour(s))   CT BRAIN WO IV CONTRAST     Status: None    Narrative    Male, 77 years old.    CT BRAIN WO IV CONTRAST performed on 05/04/2022 9:07 PM.    REASON FOR EXAM:  INJURY/TRAUMA    Radiation dose: Total DLP 1240 mGy*cm    This CT scanner is equipped with dose reducing technology. The exposure is automatically adjusted according to patient body size in order to deliver the lowest dose possible.    Technique: 5 mm axial images were obtained through the brain without intravenous contrast. Multiplanar reconstructions were performed.    Results: There is moderate to prominent central and cortical atrophy consistent with the patient's age. There is no evidence of acute infarct or intracranial hemorrhage. No extra-axial fluid collections are seen. There is no shift of midline structures. No intracranial masses are appreciated.    There is no calvarial fracture.      Impression    1. Moderate central and cortical atrophy consistent with the patient's age.  2. No acute intracranial process.  3. No acute posttraumatic findings.  4. The patient does have acute sinus inflammatory change including some mucosal thickening within the left ethmoid air cells and fluid filling much of the left maxillary sinus             Radiologist location ID: [id number]     CT CERVICAL SPINE WO IV CONTRAST     Status: None    Narrative    Male, 77 years old.    CT CERVICAL SPINE WO IV CONTRAST performed on 05/04/2022 9:08  PM.    REASON FOR EXAM:  INJURY/TRAUMA    CT Dose:  325 DLP (mGy*cm)  This CT scanner is equipped with dose reducing technology. The mAs is automatically adjusted to patient's body size in order to deliver the lowest dose possible.    FINDINGS: Axial CT imaging of the cervical spine was performed along with sagittal and coronal reformats. Comparison is made with a prior CT dated 06/21/2021. The bony structures are osteopenic. Vertebral body heights are maintained. There is reversal the normal cervical lordosis is slight anterior positioning of C4 on 5. The alignment is unchanged. There is loss of disc height, bony spurring and facet arthrosis at the lower levels which is also stable. The odontoid process is intact. The paravertebral soft tissues show no acute process.      Impression    1. Multilevel degenerative change is noted and stable. There is no acute bony process detected.      Radiologist location ID: [id number]     CT CHEST WO IV CONTRAST     Status: None    Narrative  Male, 77 years old.    CT CHEST WO IV CONTRAST performed on 05/04/2022 9:17 PM.    REASON FOR EXAM:  INJURY/TRAUMA    CT Dose:  325 DLP (mGy*cm)  This CT scanner is equipped with dose reducing technology. The mAs is automatically adjusted to patient's body size in order to deliver the lowest dose possible.    FINDINGS: Axial unenhanced CT imaging of the chest was performed along with reformats per the trauma protocol. Comparison made prior study dated 04/21/2022. There has been interval increase in the bilateral posterior parenchymal densities with a combination of linear and somewhat consolidative appearance likely represent atelectasis superimposed on patient's chronic interstitial change and scarring. There is also small amount of chronic interstitial change at the right middle lobe. There is no pneumothorax or pleural effusion. The heart is enlarged but stable. There is no significant pericardial effusion. No hilar or mediastinal  adenopathy is detected.    The bony structures remain osteopenic. There is a severe compressive deformity at T8 which is unchanged from prior exam as this is essentially a vertebra plana. No new compressive deformity or change in alignment is seen throughout the spine. Sternum is intact. The ribs show no displaced fracture.      Impression    1. Interval increase in parenchymal densities posteriorly in the lower lung zones likely ribs and in atelectasis or dense infiltrate superimposed on patient's chronic interstitial change.  2. There is a significant compressive deformity at T8 which has been present at least dating back to 06/21/2021. There is no retropulsion or central canal encroachment.  3. Cardiomegaly and trace pericardial effusion are noted, stable      Radiologist location ID: [id number]     CT ABDOMEN PELVIS WO IV CONTRAST     Status: None    Narrative    Male, 77 years old.    CT ABDOMEN PELVIS WO IV CONTRAST performed on 05/04/2022 9:17 PM.    REASON FOR EXAM:  TRAUMA    CT Dose:  605 DLP (mGy*cm)  This CT scanner is equipped with dose reducing technology. The mAs is automatically adjusted to patient's body size in order to deliver the lowest dose possible.    FINDINGS: Axial unenhanced CT imaging of the abdomen and pelvis was performed along with reformats per the trauma protocol. Comparison is made with prior study dated 06/21/2021. Multiple cystic areas are seen within the liver which are stable. No suspicious hepatic lesion or ductal dilatation is seen. The gallbladder, adrenal glands and kidneys show no acute process or significant abnormality. The spleen is surgically absent.    At the head of the pancreas there is a cystic process which has increased in size since the prior study. Currently this measures up to 3.5 cm. This is limited in evaluation due to the lack of contrast material there is no pancreatic ductal dilatation. The distal pancreas has been resected.    The small and large bowel show  no obstructive change. A large volume of formed stool was noted throughout colon. No bowel wall thickening or surrounding inflammation is seen. There is wide rectus diastases/ventral hernia. No free fluid or air is seen. Urinary bladder is distended symmetrically. IVC filter is in place. Metallic fiducials are noted at the level of the prostate.    The bony structures are diffusely osteopenic. There are degenerative changes without an acute bony process.      Impression    1. No evidence of acute/posttraumatic change within the abdomen  or pelvis.  2. Interval increase in size of the cystic lesion at the head of the pancreas measuring up to 3.5 cm.  3. Large volume of formed stool is noted within the colon suggestive of constipation although the exact etiology is indeterminate.  4. ASCVD.  5. Rectus diastases.  6. Bony structures show no acute process.      Radiologist location ID: [id number]         Abnormal Lab results:  Labs Ordered/Reviewed   COMPREHENSIVE METABOLIC PANEL, NON-FASTING - Abnormal; Notable for the following components:       Result Value    SODIUM 134 (*)     CHLORIDE 93 (*)     CO2 TOTAL 32 (*)     BUN 34 (*)     CREATININE 1.77 (*)     ALBUMIN 2.5 (*)     CALCIUM 13.2 (*)     GLUCOSE 148 (*)     ESTIMATED GFR - MALE 39 (*)     All other components within normal limits   PTT (PARTIAL THROMBOPLASTIN TIME) - Abnormal; Notable for the following components:    APTT 24.2 (*)     All other components within normal limits    Narrative:     Therapeutic range for unfractionated heparin is 60-100 seconds.   DRUG SCREEN, WITH CONFIRMATION, URINE - Abnormal; Notable for the following components:    OXYCODONE URINE Positive (*)     All other components within normal limits    Narrative:     Drug screening results are intended for medical use only and are presumptive/unconfirmed. When unexpected results are obtained, consider definitive testing if it has not been ordered.                     URINALYSIS,  MACROSCOPIC - Abnormal; Notable for the following components:    APPEARANCE Turbid (*)     GLUCOSE 1+ (*)     All other components within normal limits   CBC WITH DIFF - Abnormal; Notable for the following components:    HGB 12.4 (*)     HCT 38.8 (*)     RDW-CV 26.7 (*)     IMMATURE GRANULOCYTE % 3.0 (*)     IMMATURE GRANULOCYTE # 0.33 (*)     All other components within normal limits   URINALYSIS, MICROSCOPIC - Abnormal; Notable for the following components:    AMORPHOUS SEDIMENT Rare (*)     HYALINE CASTS 0-5 (*)     SQUAMOUS EPITHELIAL Rare (*)     All other components within normal limits   IONIZED CALCIUM WITH PH - Abnormal; Notable for the following components:    PH (VENOUS) 7.48 (*)     IONIZED CALCIUM 1.83 (*)     All other components within normal limits   PT/INR - Normal   VITAMIN D 25 TOTAL - Normal   PHOSPHORUS - Normal   CBC/DIFF    Narrative:     The following orders were created for panel order CBC/DIFF.                  Procedure                               Abnormality         Status                                     ---------                               -----------         ------  CBC WITH DIFF[[id number]]                Abnormal            Final result                                                 Please view results for these tests on the individual orders.   ETHANOL, SERUM/PLASMA   OPIATES CONFIRMATORY/DEFINITIVE, URINE, BY LC-MS/MS (PERFORMABLE)   PERFORM POC ISTAT CREATININE POINT OF CARE   TYPE AND SCREEN     EKG's     Most Recent EKG This Encounter   ECG 12 LEAD ONE TIME    Collection Time: 05/04/22  6:30 PM   Result Value    Ventricular rate 98    Atrial Rate 98    PR Interval 144    QRS Duration 76    QT Interval 352    QTC Calculation 449    Calculated P Axis 38    Calculated R Axis -5    Calculated T Axis 34    Narrative    Normal sinus rhythm  Nonspecific ST abnormality  Abnormal ECG  NO STEMI  Reviewed by Costella [name] MD at 1830  Confirmed by  Costella [name] [phone]), editor Enis Slipper [phone]) on 05/04/2022 7:11:43 PM       Assessment & Plan     Plan: Appropriate labs and imaging ordered. Medical Records reviewed.    MDM:   During the patient's stay in the emergency department, the above listed information was used to assist with medical decision making and were reviewed by myself when available for review.  ED Course     ED Course as of 05/05/22 1158   Wed May 04, 2022   2128 1. Interval increase in parenchymal densities posteriorly in the lower lung zones likely ribs and in atelectasis or dense infiltrate superimposed on patient's chronic interstitial change.  2. There is a significant compressive deformity at T8 which has been present at least dating back to 06/21/2021. There is no retropulsion or central canal encroachment.  3. Cardiomegaly and trace pericardial effusion are noted, stable     2140 Interval increase in size of the cystic lesion at the head of the pancreas measuring up to 3.5 cm.       Pt remained stable throughout the emergency department course.      Medical Decision Making  See HPI, PMHx, PE, and ROS for additional information.     Problems Addressed:  Hypercalcemia: acute illness or injury  Syncope, unspecified syncope type: acute illness or injury    Amount and/or Complexity of Data Reviewed  Labs: ordered.  Radiology: ordered.  ECG/medicine tests: ordered.  Discussion of management or test interpretation with external provider(s): Spoke with Dr. Raphael Gibney via secure chat at 2010. They recommend admitting patient under themself for further medical evaluation and care.    Risk  Prescription drug management.  Decision regarding hospitalization.       Disposition   Impression:   Diagnoses       Diagnosis Comment Added By Time Added    Hypercalcemia  Costella [name], MD 05/04/2022 10:11 PM    Syncope, unspecified syncope type  Costella [name], MD 05/04/2022 10:11 PM          Disposition:  Admitted    Patient will be  admitted to hospitalist service for  further evaluation and management.    2010: Care of patient will be admitted to Dr. Raphael Gibney at this time.   The patient verbalized understanding of all instructions and had no further questions or concerns.       I am scribing for, and in the presence of, Dr. Costella [name] for services provided on 05/04/2022.  Enis Slipper, SCRIBE     [place], [place]  05/04/2022, 18:00    I personally performed the services described in this documentation, as scribed  in my presence, and it is both accurate  and complete.    Costella [name], MD    Costella [name], MD  05/05/2022 11:58      This note was partially generated using MModal Fluency Direct system, and there may be some incorrect words, spellings, and punctuation that were not noted in checking the note before saving.

## 2022-05-04 NOTE — H&P (Addendum)
[facility]  Admission H&P        Date of Service:  05/04/2022  [name] [facility] y.o. male  Date of Admission:  05/04/2022  Date of Birth:  [birth year]      Chief Complaint:  Weakness    HPI: [name] is a 77 y.o., White male who presents with weakness.    Patient presents from home with the spouse for 2 days of lower extremity weakness and near falls.  Patient recently was started on insulin a month ago and the patient was complaining of bilateral lower extremity weakness yesterday.  Spouse was concerned that this may have been related to his blood sugars and she had frequent checks which were all normal.  On the day of admission, the patient attempted to void twice in during both episodes had bilateral lower extremity weakness and fell forward but did not lose consciousness.  Given that he had an unsteady gait and normal blood sugars, spouse became concerned and brought the patient to the emergency department.      Patient does have a significant past medical history of prostate cancer status post proton therapy treatment in 2017.  Denies any known active malignancy at this time.  Patient states he is compliant with all of his chronic medications which also includes vitamin-D.  He otherwise had no recent medication changes outside of recently starting insulin.  Discussed that the patient's elevated calcium level may be contributing to his symptoms and we will perform further workup of his hypercalcemia in addition to obtaining a consult for Physical and Occupational therapy to evaluate his recent falls.  Patient and spouse were agreeable with plan.    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)     Hyperlipidemia     "borderline"    Hypothyroidism     MRSA (methicillin resistant staph aureus) culture positive 01/02/2021    MRSA left groin abscess 01/03/21    MRSA (methicillin resistant staph aureus) culture positive 01/02/2021    MRSA blood 01/02/21    Pulmonary embolism (CMS [place]) 03/31/2021    Thyroid disorder     Wears glasses      Past Surgical:    Past Surgical History:   Procedure Laterality Date    COLON SURGERY      per patient had 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 Darryl Nestle CERVICAL SPINE SURGERY  2001    HX COLOSTOMY REVERSAL      HX HERNIA REPAIR      HX LAPAROTOMY      KNEE SURGERY Bilateral     PANCREATECTOMY      SPLENECTOMY      WRIST SURGERY Right      Family:    Family Medical History:  Problem Relation (Age of Onset)    Cancer Mother, Father, Other    Heart Attack Mother    High Cholesterol Other    Stroke Other          Social:   reports that he has never smoked. He has never used smokeless tobacco. He reports that he does not currently use alcohol. He reports that he does not use drugs.    No Known Allergies  Medications Prior to Admission       Prescriptions    acetaminophen (TYLENOL) 325 mg Oral Tablet    Take 2 Tablets (650 mg total) by mouth Every 6 hours as needed for Pain    amLODIPine (NORVASC) 10 mg Oral Tablet    Take 1 Tablet (10 mg total) by mouth Once a day    atorvastatin (LIPITOR) 80 mg Oral Tablet    Take 0.5 Tablets (40 mg total) by mouth Every morning with breakfast    cefdinir (OMNICEF) 300 mg Oral Capsule    Take 1 Capsule (300 mg total) by mouth Twice daily for 7 days    cholecalciferol, vitamin D3, 25 mcg (1,000 unit) Oral Tablet    Take 1 Tablet (1,000 Units total) by mouth Once a day    cyanocobalamin (VITAMIN B 12) 1,000 mcg Oral Tablet    Take 1 Tablet (1,000  mcg total) by mouth Every morning    docusate sodium (COLACE) 100 mg Oral Capsule    Take 1 Capsule (100 mg total) by mouth    elviteg-cob-emtri-tenof ALAFEN (GENVOYA) 150-150-200-10 mg Oral Tablet    Take 1 Tablet by mouth Once a day    ferrous sulfate (FEOSOL) 325 mg (65 mg iron) Oral Tablet    Take 1 Tablet (325 mg total) by mouth Once a day    flash glucose sensor (FREESTYLE LIBRE 2 SENSOR) Does not apply Kit    To check blood glucose continuously. Change every 14 days    gabapentin (NEURONTIN) 100 mg Oral Capsule    Take 3 Capsules (300 mg total) by mouth Three times a day For shingle pain    glipiZIDE (GLUCOTROL) 5 mg Oral Tablet    Take 1 tablet twice daily    insulin glargine (LANTUS SOLOSTAR U-100 INSULIN) 100 unit/mL Subcutaneous Insulin Pen    Inject 8 Units under the skin Every night    levothyroxine (SYNTHROID) 88 mcg Oral Tablet    Take 75 mcg by mouth Every morning    melatonin 3 mg Oral Tablet    Take 2 Tablets (6 mg total) by mouth Every night    Mirtazapine (REMERON) 7.5 mg Oral Tablet    Take 1 Tablet (7.5 mg total) by mouth Every night    MULTIVITAMIN ORAL    Take 1 Tablet by mouth Once a day    naproxen (NAPROSYN) 500 mg Oral Tablet    Take 1 Tablet (500 mg total) by mouth Twice daily with food for 7 days    oxyCODONE (ROXICODONE) 5 mg Oral Tablet    Take 1 Tablet (5 mg total) by mouth Every 4 hours as needed for Pain    pantoprazole (PROTONIX) 40 mg Oral Tablet, Delayed Release (E.C.)    Take 1 Tablet (40 mg total) by mouth Once a day    Pen Needle, Disposable, 32 gauge x 5/32" Needle    Use one pen needle with daily Lantus injection.    polyethylene glycol (MIRALAX) 17 gram/dose Oral Powder    Take 3 teaspoons (17 g  total) by mouth Once a day    sennosides-docusate sodium (SENOKOT-S) 8.6-50 mg Oral Tablet    Take 1 Tablet by mouth Twice daily    valACYclovir (VALTREX) 1 gram Oral Tablet    Take 1 Tablet (1 g total) by mouth Three times a day for 7 days          acetaminophen (TYLENOL) tablet,  500 mg, Oral, Q4H PRN  [START ON 05/05/2022] amLODIPine (NORVASC) tablet, 10 mg, Oral, Daily  [START ON 05/05/2022] atorvastatin (LIPITOR) tablet, 40 mg, Oral, Daily with Breakfast  [START ON 05/05/2022] calcitonin-salmon (MIACALCIN) 200 units/mL injection, 4 Units/kg, Subcutaneous, Once  [START ON 05/05/2022] Correction/SSIP insulin lispro (HumaLOG) 100 units/mL injection pen, 0-18 Units, Subcutaneous, 4x/day AC  dextrose (GLUTOSE) 40% oral gel, 15 g, Oral, Q15 Min PRN  dextrose 50% (0.5 g/mL) injection - syringe, 12.5 g, Intravenous, Q15 Min PRN  [START ON 05/05/2022] elvitegravir-cobicistat-emtricitrabine-tenofovir (GENVOYA) 150-150-200-10 mg per tablet, 1 Tablet, Oral, Daily  [START ON 05/05/2022] gabapentin (NEURONTIN) capsule, 300 mg, Oral, 3x/day  [START ON 05/05/2022] glipiZIDE (GLUCOTROL) tablet, 5 mg, Oral, 2x/day AC  glucagon injection 1 mg, 1 mg, IntraMUSCULAR, Once PRN  [START ON 05/05/2022] insulin glargine-yfgn 100 Units/mL SubQ pen, 8 Units, Subcutaneous, NIGHTLY  [START ON 05/05/2022] levothyroxine (SYNTHROID) tablet, 75 mcg, Oral, QAM  magnesium hydroxide (MILK OF MAGNESIA) 400mg  per 66mL oral liquid, 15 mL, Oral, Daily PRN  [START ON 05/05/2022] mirtazapine (REMERON) tablet, 7.5 mg, Oral, NIGHTLY  NS flush syringe, 3 mL, Intracatheter, Q8HRS  NS flush syringe, 3 mL, Intracatheter, Q1H PRN  [START ON 05/05/2022] NS flush syringe, 3 mL, Intracatheter, Q8HRS  NS flush syringe, 3 mL, Intracatheter, Q1H PRN  [START ON 05/05/2022] NS premix infusion, , Intravenous, Continuous  ondansetron (ZOFRAN) 2 mg/mL injection, 4 mg, Intravenous, Q6H PRN  [START ON 05/05/2022] pantoprazole (PROTONIX) delayed release tablet, 40 mg, Oral, Daily        ROS:   Review of Systems   Neurological:  Positive for weakness.       All other systems negative unless marked.       Exam:  Vitals:    05/04/22 2215 05/04/22 2230 05/04/22 2245 05/04/22 2300   BP: 116/77 134/88 122/89 125/85   Pulse:   94 89   Resp:   16 17   Temp:    37.1 C (98.7 F)   SpO2:   92% 93% 91%   Weight:       Height:       BMI:                 Intake/Output Summary (Last 24 hours) at 05/04/2022 2319  Last data filed at 05/04/2022 2030  Gross per 24 hour   Intake 1000 ml   Output --   Net 1000 ml        Physical Exam  Constitutional:       Appearance: Normal appearance.      Comments: Midly hard of hearing   HENT:      Head: Normocephalic and atraumatic.      Nose: Nose normal.   Eyes:      General: No scleral icterus.     Extraocular Movements: Extraocular movements intact.   Cardiovascular:      Rate and Rhythm: Normal rate and regular rhythm.   Pulmonary:      Effort: Pulmonary effort is normal. No respiratory distress.      Breath sounds: Normal breath sounds. No wheezing or rales.   Musculoskeletal:  Cervical back: Normal range of motion.      Comments: 4/5 strength bilateral lower extremity   Neurological:      Mental Status: He is alert.          Labs:     Results for orders placed or performed during the hospital encounter of 05/04/22 (from the past 24 hour(s))   CBC/DIFF    Narrative    The following orders were created for panel order CBC/DIFF.  Procedure                               Abnormality         Status                     ---------                               -----------         ------                     CBC WITH DIFF[[id number]]                Abnormal            Final result                 Please view results for these tests on the individual orders.   COMPREHENSIVE METABOLIC PANEL, NON-FASTING   Result Value Ref Range    SODIUM 134 (L) 136 - 145 mmol/L    POTASSIUM 4.1 3.5 - 5.1 mmol/L    CHLORIDE 93 (L) 96 - 111 mmol/L    CO2 TOTAL 32 (H) 23 - 31 mmol/L    ANION GAP 9 4 - 13 mmol/L    BUN 34 (H) 8 - 25 mg/dL    CREATININE 1.77 (H) 0.75 - 1.35 mg/dL    BUN/CREA RATIO 19 6 - 22    ALBUMIN 2.5 (L) 3.4 - 4.8 g/dL     CALCIUM 13.2 (HH) 8.6 - 10.3 mg/dL    GLUCOSE 148 (H) 65 - 125 mg/dL    ALKALINE PHOSPHATASE 89 45 - 115 U/L    ALT (SGPT) 31 10 - 55 U/L    AST (SGOT)  24 8  - 45 U/L    BILIRUBIN TOTAL 0.4 0.3 - 1.3 mg/dL    PROTEIN TOTAL 6.5 6.0 - 8.0 g/dL    ESTIMATED GFR - MALE 39 (L) >=60 mL/min/BSA   PT/INR   Result Value Ref Range    PROTHROMBIN TIME 11.5 9.7 - 13.6 seconds    INR 0.97 <=5.00   PTT (PARTIAL THROMBOPLASTIN TIME)   Result Value Ref Range    APTT 24.2 (L) 26.0 - 39.0 seconds    Narrative    Therapeutic range for unfractionated heparin is 60-100 seconds.   ETHANOL, SERUM   Result Value Ref Range    ETHANOL None Detected     ETHANOL <10 <10 mg/dL   DRUG SCREEN, WITH CONFIRMATION, URINE   Result Value Ref Range    AMPHETAMINES, URINE Negative Negative    BARBITURATES URINE Negative Negative    BENZODIAZEPINES URINE Negative Negative    BUPRENORPHINE URINE Negative Negative    CANNABINOIDS URINE Negative Negative    COCAINE METABOLITES URINE Negative Negative    METHADONE URINE Negative Negative    OPIATES URINE (  LOW CUTOFF) Negative Negative    OXYCODONE URINE Positive (A) Negative    ECSTASY/MDMA URINE Negative Negative    FENTANYL, RANDOM URINE Negative Negative    CREATININE RANDOM URINE 27 >=20 mg/dL    Narrative    Drug screening results are intended for medical use only and are presumptive/unconfirmed. When unexpected results are obtained, consider definitive testing if it has not been ordered.     URINALYSIS, MACROSCOPIC   Result Value Ref Range    COLOR Light Yellow Colorless, Straw, Yellow, Light Yellow    APPEARANCE Turbid (A) Clear    SPECIFIC GRAVITY 1.010 >1.005 - <1.030    PH 7.0 >5.0 - <8.0    PROTEIN Not Detected Not Detected mg/dL    GLUCOSE 1+ (A) Not Detected mg/dL    KETONES Not Detected Not Detected mg/dL    UROBILINOGEN Not Detected Not Detected mg/dL    BILIRUBIN Not Detected Not Detected mg/dL    BLOOD Not Detected Not Detected mg/dL    NITRITE Not Detected Not Detected    LEUKOCYTES Not Detected Not Detected WBCs/uL   CBC WITH DIFF   Result Value Ref Range    WBC 10.9 3.7 - 11.0 x10^3/uL    RBC 4.62 4.50 - 6.10 x10^6/uL    HGB 12.4 (L) 13.4 -  17.5 g/dL    HCT 38.8 (L) 38.9 - 52.0 %    MCV 84.0 78.0 - 100.0 fL    MCH 26.8 26.0 - 32.0 pg    MCHC 32.0 31.0 - 35.5 g/dL    RDW-CV 26.7 (H) 11.5 - 15.5 %    PLATELETS 302 150 - 400 x10^3/uL    MPV 11.3 8.7 - 12.5 fL    NEUTROPHIL % 70.0 %    LYMPHOCYTE % 20.0 %    MONOCYTE % 7.0 %    EOSINOPHIL % 0.0 %    BASOPHIL % 0.0 %    NEUTROPHIL # 7.53 1.50 - 7.70 x10^3/uL    LYMPHOCYTE # 2.16 1.00 - 4.80 x10^3/uL    MONOCYTE # 0.81 0.20 - 1.10 x10^3/uL    EOSINOPHIL # <0.10 <=0.50 x10^3/uL    BASOPHIL # <0.10 <=0.20 x10^3/uL    IMMATURE GRANULOCYTE % 3.0 (H) 0.0 - 1.0 %    IMMATURE GRANULOCYTE # 0.33 (H) <0.10 x10^3/uL   URINALYSIS, MICROSCOPIC   Result Value Ref Range    WBCS 0-5 0-5, None /hpf    RBCS 0-5 None, 0-5 /hpf    AMORPHOUS SEDIMENT Rare (A) None, Mod /hpf    HYALINE CASTS 0-5 (A) None /lpf    SQUAMOUS EPITHELIAL Rare (A) None /hpf   IONIZED CALCIUM WITH PH   Result Value Ref Range    PH (VENOUS) 7.48 (H) 7.32 - 7.43    IONIZED CALCIUM 1.83 (HH) 1.15 - 1.33 mmol/L   TYPE AND SCREEN   Result Value Ref Range    UNITS ORDERED NOT STATED     ABO/RH(D) A NEGATIVE     ANTIBODY SCREEN NEGATIVE     SPECIMEN EXPIRATION DATE 05/07/2022,2359         Imaging Studies:    CT ABDOMEN PELVIS WO IV CONTRAST   Final Result   1. No evidence of acute/posttraumatic change within the abdomen or pelvis.   2. Interval increase in size of the cystic lesion at the head of the pancreas measuring up to 3.5 cm.   3. Large volume of formed stool is noted within the colon suggestive of constipation although the exact etiology  is indeterminate.   4. ASCVD.   5. Rectus diastases.   6. Bony structures show no acute process.         Radiologist location ID: [place]         CT CHEST WO IV CONTRAST   Final Result   1. Interval increase in parenchymal densities posteriorly in the lower lung zones likely ribs and in atelectasis or dense infiltrate superimposed on patient's chronic interstitial change.   2. There is a significant compressive  deformity at T8 which has been present at least dating back to 06/21/2021. There is no retropulsion or central canal encroachment.   3. Cardiomegaly and trace pericardial effusion are noted, stable         Radiologist location ID: [id number]         CT CERVICAL SPINE WO IV CONTRAST   Final Result   1. Multilevel degenerative change is noted and stable. There is no acute bony process detected.         Radiologist location ID: [place]         CT BRAIN WO IV CONTRAST   Final Result      1. Moderate central and cortical atrophy consistent with the patient's age.   2. No acute intracranial process.   3. No acute posttraumatic findings.   4. The patient does have acute sinus inflammatory change including some mucosal thickening within the left ethmoid air cells and fluid filling much of the left maxillary sinus                   Radiologist location ID: [id number]             DNR Status:  Full Code    Assessment/Plan:   Active Hospital Problems    Diagnosis    Primary Problem: Hypercalcemia    Near syncope    Acute kidney injury (CMS HCC)    Pancreatic mass    GERD without esophagitis    Essential hypertension    Acquired hypothyroidism    Mixed hyperlipidemia     # hypercalcemia  -primary symptoms of generalized lower extremity weakness for the past 48 hours  -recently started long-acting insulin 1 month ago, otherwise no medication changes  -states he is compliant with all of his chronic medications  -13.2 on BMP (14.8 when corrected for albumin)  -ionized calcium elevated at 1.83  -physical exam negative for [place] sign  -CT evidence of significant stool burden however patient does not complain of constipation or abdominal pain    Plan:   -calcitonin 250 mg subcutaneously x1 dose  -normal saline at 100 cc an hour   -PTH, phosphorus and vitamin-D levels pending  -repeat labs in a.m.  -holding vitamin-D 1000 units q.day  -consider tele-endocrinology consult if available    # generalized weakness  -as above  -PT  and OT on board, recommendations appreciated    #AKI   -creatinine 1.8 on arrival, baseline 1.1   -normal saline at 100 cc an hour   -labs in a.m. to monitor renal function    #shingles  -recently diagnosed in the outpatient setting   -continue Genvoya 1 tablet daily   -continue gabapentin 300 mg p.o. t.i.d.    # diabetes   -continue Lantus 8 units subcutaneously q.h.s.  -low-dose insulin sliding scale with hypoglycemic protocol   -blood sugar checks per protocol    # hypertension   -stable  -vitals per unit routine   -continue Norvasc 10 mg p.o. daily  -  titrate medications accordingly     # hypothyroidism   -continue Synthroid 88 mcg p.o. daily    # GERD   -continue Protonix 40 mg p.o. daily    # hyperlipidemia   -continue Lipitor 80 mg p.o. q.h.s.    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 q.h.s.  -continue melatonin 6 mg p.o. q.h.s.    DVT/PE Prophylaxis:  SCDs, patient had previous pulmonary emboli and significant bleeding while on Eliquis  Disposition Planning:  TBD, pending PT and OT eval    Faylene Kurtz, MD    This note was partially generated using MModal Fluency Direct system, and there may be some incorrect words, spellings, and punctuation that were not noted in checking the note before saving

## 2022-05-04 NOTE — H&P (Deleted)
GASTROENTEROLOGY, [place]  [place] [address]  [phone]    History and Physical  Name: [name]  MRN: [id number]  DOB: Aug 25, 1945  Age: 77 y.o.  Date: 05/05/2022  Referring provider: Milinda Cave, MD    Reason for Visit: No chief complaint on file.    History of Present Illness:  [name] is a 77 y.o. male who presents today for ***.    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      Patient History:  Past Medical History:   Diagnosis Date    Cancer (CMS [facility])     prostate    Deep vein thrombosis (DVT) (CMS HCC)     right leg    Esophageal reflux     H/O hearing loss     High cholesterol     History of kidney disease     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    HTN (hypertension)     Human immunodeficiency virus (HIV) disease (CMS HCC)     Hyperlipidemia     "borderline"    Hypothyroidism     MRSA (methicillin resistant staph aureus) culture positive 01/02/2021    MRSA left groin abscess 01/03/21    MRSA (methicillin resistant staph aureus) culture positive 01/02/2021    MRSA blood 01/02/21    Pulmonary embolism (CMS [place]) 03/31/2021    Thyroid disorder     Wears glasses          Past Surgical History:   Procedure Laterality Date    COLON SURGERY      per patient had 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 Darryl Nestle CERVICAL SPINE SURGERY  2001    HX COLOSTOMY REVERSAL      HX HERNIA REPAIR      HX LAPAROTOMY      KNEE  SURGERY Bilateral     PANCREATECTOMY      SPLENECTOMY      WRIST SURGERY Right          Current Outpatient Medications   Medication Sig    acetaminophen (TYLENOL) 325 mg Oral Tablet Take 2 Tablets (650 mg total) by mouth Every 6 hours as needed for Pain    amLODIPine (NORVASC) 10 mg Oral Tablet Take 1 Tablet (10 mg total) by mouth Once a day    atorvastatin (LIPITOR) 80 mg Oral Tablet Take 0.5 Tablets (40 mg total) by mouth Every morning with breakfast    cholecalciferol, vitamin D3, 25 mcg (1,000 unit) Oral Tablet Take 1 Tablet (1,000 Units total) by mouth Once a day    cyanocobalamin (VITAMIN B 12) 1,000 mcg Oral Tablet Take 1 Tablet (1,000 mcg total) by mouth Every morning    docusate sodium (COLACE) 100 mg Oral Capsule Take 1 Capsule (100 mg total) by mouth    elviteg-cob-emtri-tenof ALAFEN (GENVOYA) 150-150-200-10 mg Oral Tablet Take 1 Tablet by mouth Once a day    ferrous sulfate (FEOSOL)  325 mg (65 mg iron) Oral Tablet Take 1 Tablet (325 mg total) by mouth Once a day    flash glucose sensor (FREESTYLE LIBRE 2 SENSOR) Does not apply Kit To check blood glucose continuously. Change every 14 days    gabapentin (NEURONTIN) 100 mg Oral Capsule Take 3 Capsules (300 mg total) by mouth Three times a day For shingle pain    glipiZIDE (GLUCOTROL) 5 mg Oral Tablet Take 1 tablet twice daily    insulin glargine (LANTUS SOLOSTAR U-100 INSULIN) 100 unit/mL Subcutaneous Insulin Pen Inject 8 Units under the skin Every night    levothyroxine (SYNTHROID) 88 mcg Oral Tablet Take 75 mcg by mouth Every morning    melatonin 3 mg Oral Tablet Take 2 Tablets (6 mg total) by mouth Every night    Mirtazapine (REMERON) 7.5 mg Oral Tablet Take 1 Tablet (7.5 mg total) by mouth Every night    MULTIVITAMIN ORAL Take 1 Tablet by mouth Once a day    oxyCODONE (ROXICODONE) 5 mg Oral Tablet Take 1 Tablet (5 mg total) by mouth Every 4 hours as needed for Pain    pantoprazole (PROTONIX) 40 mg Oral Tablet, Delayed Release (E.C.) Take 1 Tablet (40 mg  total) by mouth Once a day    Pen Needle, Disposable, 32 gauge x 5/32" Needle Use one pen needle with daily Lantus injection.    polyethylene glycol (MIRALAX) 17 gram/dose Oral Powder Take 3 teaspoons (17 g total) by mouth Once a day    sennosides-docusate sodium (SENOKOT-S) 8.6-50 mg Oral Tablet Take 1 Tablet by mouth Twice daily     No Known Allergies  Family Medical History:       Problem Relation (Age of Onset)    Cancer Mother, Father, Other    Heart Attack Mother    High Cholesterol Other    Stroke Other            Social History     Socioeconomic History    Marital status: Married     Spouse name: Not on file    Number of children: Not on file    Years of education: Not on file    Highest education level: Not on file   Occupational History    Not on file   Tobacco Use    Smoking status: Never    Smokeless tobacco: Never   Vaping Use    Vaping status: Never Used   Substance and Sexual Activity    Alcohol use: Not Currently    Drug use: Never    Sexual activity: Not on file   Other Topics Concern    Calcium intake adequate Not Asked    Computer Use Not Asked    Drives Not Asked    Exercise Concern Not Asked    Helmet Use Not Asked    Seat Belt Not Asked    Uses Cane Not Asked    Uses walker Not Asked    Uses wheelchair Not Asked    Right hand dominant Not Asked    Left hand dominant Not Asked    Ambidextrous Not Asked    Uses Scooter Not Asked    Activity Limitations Not Asked    Ability to Walk 1 Flight of Steps without SOB/CP Yes    Routine Exercise Yes    Ability to Walk 2 Flight of Steps without SOB/CP Yes    Unable to Ambulate No    Total Care No    Ability To Do Own ADL's  Yes    Uses Walker No    Other Activity Level Yes    Uses Cane No   Social History Narrative    Not on file     Social Determinants of Health     Financial Resource Strain: Low Risk  (12/31/2021)    Financial Resource Strain     SDOH Financial: No   Transportation Needs: Low Risk  (12/31/2021)    Transportation Needs     SDOH  Transportation: No   Social Connections: Low Risk  (12/31/2021)    Social Connections     SDOH Social Isolation: 5 or more times a week   Intimate Partner Violence: Low Risk  (12/31/2021)    Intimate Partner Violence     SDOH Domestic Violence: No   Housing Stability: Low Risk  (12/31/2021)    Housing Stability     SDOH Housing Situation: I have housing.     SDOH Housing Worry: No     Review of Systems:                                      All other review of systems negative.     Physical Exam:  There were no vitals taken for this visit.      Physical Exam  Vitals reviewed.   Constitutional:       General: He is not in acute distress.     Appearance: Normal appearance. He is not ill-appearing.   HENT:      Head: Normocephalic and atraumatic.      Nose: Nose normal.   Eyes:      Extraocular Movements: Extraocular movements intact.      Conjunctiva/sclera: Conjunctivae normal.      Pupils: Pupils are equal, round, and reactive to light.   Pulmonary:      Effort: Pulmonary effort is normal.   Musculoskeletal:         General: No swelling, deformity or signs of injury. Normal range of motion.      Cervical back: Normal range of motion.   Skin:     General: Skin is warm and dry.      Findings: No erythema.   Neurological:      General: No focal deficit present.      Mental Status: He is alert and oriented to person, place, and time. Mental status is at baseline.   Psychiatric:         Mood and Affect: Mood normal.         Behavior: Behavior normal.         Thought Content: Thought content normal.       Assessment and Plan:    Fara Chute, FNP-C  05/04/2022, 11:06    This note may have been partially generated using MModal Fluency Direct system, and there may be some incorrect words, spellings, and punctuation that were not noted in checking the note before saving, though effort was made to avoid such errors.

## 2022-05-04 NOTE — ED Nurses Note (Signed)
Report called to 1W nurse at this time.

## 2022-05-04 NOTE — ED Triage Notes (Signed)
EMS reports pt had 3 falls today due to weakness in his legs. Pt reports he hit his head with each fall with last fall at 1400. Pt takes Eliquis. EMS reports pt was 88% on RA, placed on 2LNC.

## 2022-05-05 ENCOUNTER — Ambulatory Visit (HOSPITAL_BASED_OUTPATIENT_CLINIC_OR_DEPARTMENT_OTHER): Payer: 59 | Admitting: Nurse Practitioner

## 2022-05-05 ENCOUNTER — Encounter (INDEPENDENT_AMBULATORY_CARE_PROVIDER_SITE_OTHER): Payer: 59 | Admitting: INFECTIOUS DISEASE

## 2022-05-05 DIAGNOSIS — Z8546 Personal history of malignant neoplasm of prostate: Secondary | ICD-10-CM

## 2022-05-05 DIAGNOSIS — Z21 Asymptomatic human immunodeficiency virus [HIV] infection status: Secondary | ICD-10-CM

## 2022-05-05 DIAGNOSIS — R2681 Unsteadiness on feet: Secondary | ICD-10-CM

## 2022-05-05 DIAGNOSIS — E039 Hypothyroidism, unspecified: Secondary | ICD-10-CM

## 2022-05-05 DIAGNOSIS — N179 Acute kidney failure, unspecified: Secondary | ICD-10-CM

## 2022-05-05 DIAGNOSIS — K219 Gastro-esophageal reflux disease without esophagitis: Secondary | ICD-10-CM

## 2022-05-05 DIAGNOSIS — Z86718 Personal history of other venous thrombosis and embolism: Secondary | ICD-10-CM

## 2022-05-05 DIAGNOSIS — K5900 Constipation, unspecified: Secondary | ICD-10-CM

## 2022-05-05 DIAGNOSIS — I129 Hypertensive chronic kidney disease with stage 1 through stage 4 chronic kidney disease, or unspecified chronic kidney disease: Secondary | ICD-10-CM

## 2022-05-05 DIAGNOSIS — E119 Type 2 diabetes mellitus without complications: Secondary | ICD-10-CM

## 2022-05-05 DIAGNOSIS — E785 Hyperlipidemia, unspecified: Secondary | ICD-10-CM

## 2022-05-05 DIAGNOSIS — E1122 Type 2 diabetes mellitus with diabetic chronic kidney disease: Secondary | ICD-10-CM

## 2022-05-05 LAB — BASIC METABOLIC PANEL
ANION GAP: 11 mmol/L (ref 4–13)
BUN/CREA RATIO: 18 (ref 6–22)
BUN: 29 mg/dL — ABNORMAL HIGH (ref 8–25)
CALCIUM: 11.9 mg/dL — ABNORMAL HIGH (ref 8.6–10.3)
CHLORIDE: 97 mmol/L (ref 96–111)
CO2 TOTAL: 29 mmol/L (ref 23–31)
CREATININE: 1.63 mg/dL — ABNORMAL HIGH (ref 0.75–1.35)
ESTIMATED GFR - MALE: 43 mL/min/BSA — ABNORMAL LOW (ref >=60)
GLUCOSE: 104 mg/dL (ref 65–125)
POTASSIUM: 4.2 mmol/L (ref 3.5–5.1)
SODIUM: 137 mmol/L (ref 136–145)

## 2022-05-05 LAB — LIPASE: LIPASE: 22 U/L (ref 10–60)

## 2022-05-05 LAB — CBC WITH DIFF
BASOPHIL #: <0.1 10*3/uL (ref <=0.20)
BASOPHIL %: 0 %
EOSINOPHIL #: <0.1 10*3/uL (ref <=0.50)
EOSINOPHIL %: 0 %
HCT: 40.6 % (ref 38.9–52.0)
HGB: 12.9 g/dL — ABNORMAL LOW (ref 13.4–17.5)
IMMATURE GRANULOCYTE #: 0.25 10*3/uL — ABNORMAL HIGH (ref <0.10)
IMMATURE GRANULOCYTE %: 2 % — ABNORMAL HIGH (ref 0.0–1.0)
LYMPHOCYTE #: 2.13 10*3/uL (ref 1.00–4.80)
LYMPHOCYTE %: 18 %
MCH: 26.9 pg (ref 26.0–32.0)
MCHC: 31.8 g/dL (ref 31.0–35.5)
MCV: 84.6 fL (ref 78.0–100.0)
MONOCYTE #: 0.77 10*3/uL (ref 0.20–1.10)
MONOCYTE %: 7 %
NEUTROPHIL #: 8.35 10*3/uL — ABNORMAL HIGH (ref 1.50–7.70)
NEUTROPHIL %: 73 %
PLATELETS: 305 10*3/uL (ref 150–400)
RBC: 4.8 10*6/uL (ref 4.50–6.10)
RDW-CV: 27 % — ABNORMAL HIGH (ref 11.5–15.5)
WBC: 11.6 10*3/uL — ABNORMAL HIGH (ref 3.7–11.0)

## 2022-05-05 LAB — PHOSPHORUS: PHOSPHORUS: 3.2 mg/dL (ref 2.3–4.0)

## 2022-05-05 LAB — POC BLOOD GLUCOSE (RESULTS)
GLUCOSE, POC: 105 mg/dl (ref 80–130)
GLUCOSE, POC: 113 mg/dl (ref 80–130)
GLUCOSE, POC: 124 mg/dl (ref 80–130)
GLUCOSE, POC: 135 mg/dl (ref 80–130)

## 2022-05-05 LAB — ALBUMIN: ALBUMIN: 2.4 g/dL — ABNORMAL LOW (ref 3.4–4.8)

## 2022-05-05 LAB — THYROID STIMULATING HORMONE WITH FREE T4 REFLEX: TSH: 0.626 u[IU]/mL (ref 0.350–4.940)

## 2022-05-05 LAB — MAGNESIUM: MAGNESIUM: 1.9 mg/dL (ref 1.8–2.6)

## 2022-05-05 MED ORDER — HEPARIN (PORCINE) 5,000 UNIT/ML INJECTION SOLUTION
5000.0000 [IU] | Freq: Three times a day (TID) | INTRAMUSCULAR | Status: DC
Start: 2022-05-05 — End: 2022-05-05
  Administered 2022-05-05: 0 [IU] via SUBCUTANEOUS

## 2022-05-05 MED ORDER — ELVITEG 150 MG-COB 150 MG-EMTRICIT 200 MG-TENOFO ALAFENAM 10 MG TABLET
1.0000 | ORAL_TABLET | Freq: Every day | ORAL | Status: DC
Start: 2022-05-05 — End: 2022-05-05
  Administered 2022-05-05: 1 via ORAL

## 2022-05-05 MED ORDER — LACTULOSE 20 GRAM/30 ML ORAL SOLUTION
30.0000 mL | ORAL | Status: DC
Start: 2022-05-05 — End: 2022-05-05
  Administered 2022-05-05: 30 mL via ORAL
  Filled 2022-05-05: qty 30

## 2022-05-05 NOTE — Nurses Notes (Signed)
Patient being transferred to [facility] in [place] at this time via cot.

## 2022-05-05 NOTE — Nurses Notes (Signed)
Bedside report received from [facility]. Patient is alert with c/o some abdominal discomfort but no there c/o voiced. IV fluids continue per order. Call light in reach.

## 2022-05-05 NOTE — Nurses Notes (Signed)
Report called to [facility] and spoke with Corrine. Patient and spouse aware of transfer. Awaiting transport at this time.

## 2022-05-05 NOTE — Care Management Notes (Signed)
[facility]  Care Management Note    Patient Name: [name]  Date of Birth: 09-16-1945  Sex: male  Date/Time of Admission: 05/04/2022  5:39 PM  Room/Bed: 155/A  Payor: VETERANS ADMIN / Plan: VETERANS ADMINISTRATION / Product Type: Special Bill /    LOS: 1 day   PCP: [facility]    Admitting Diagnosis:  Hypercalcemia [E83.52]    Assessment:   Dr Lake Bells spoke to Dr Melvia Heaps and he is accepting physician at the [facility]. Patient will go to Med PCU on 3rd floor bed 9. Notified the patient and the wife and they agree with transfer. Transport has been set up with [facility] ambulance.     Discharge Plan:  Acute Hospital Transfer (code 2)      The patient will continue to be evaluated for developing discharge needs.     Case Manager: Reagan Klemz, CASE MANAGER  Phone: [phone]

## 2022-05-05 NOTE — Care Plan (Signed)
[facility]  Rehabilitation Services  Occupational Therapy Progress Note    Patient Name: [name]  Date of Birth: November 19, 1945  Height:  177.8 cm (5\' 10" )  Weight:  61.9 kg (136 lb 7.4 oz)  Room/Bed: 155/A  Payor: VETERANS ADMIN / Plan: VETERANS ADMINISTRATION / Product Type: Special Bill /     Order for OT received.  Attempted to see patient x 3 attempts, however, pt was nauseated first attempt and asleep 2nd and 3rd attempt. Will hold OT eval at this time and f/u when appropriate.     1 OT no charge    Therapist:   Celesta Gentile, MOTR/L     Charges Entered: 1 OT No Charge  Department Number: [phone]

## 2022-05-05 NOTE — Discharge Summary (Signed)
[facility]  DISCHARGE SUMMARY      PATIENT NAME:  [name], [name]  MRN:  [id number]  DOB:  [birth year]    ENCOUNTER DATE:  05/04/2022  INPATIENT ADMISSION DATE: 05/04/2022  DISCHARGE DATE:  05/05/2022    ATTENDING PHYSICIAN: Darrel Reach, MD  SERVICE: CCM HOSPITALIST  PRIMARY CARE PHYSICIAN: 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    Pain in the abdomen 06/27/2021    Tremor 06/27/2021    S/P exploratory laparotomy 04/03/2021    Intra abdominal hemorrhage 04/03/2021    DVT (deep venous thrombosis) (CMS HCC) 04/03/2021    Pulmonary embolism (CMS HCC) 03/31/2021    IPMN (intraductal papillary mucinous neoplasm) 03/22/2021    Cellulitis 01/04/2021    Abscess of left groin 01/03/2021    Gram-positive cocci bacteremia 01/03/2021    Sepsis due to cellulitis (CMS HCC) 01/02/2021    Lactic acidosis 01/02/2021    Rash 01/02/2021    Candida infection of genital region 11/03/2020    Chronic renal failure syndrome 11/03/2020    Postherpetic neuralgia 11/03/2020    Hyperlipidemia 11/03/2020    Thyroid disorder 11/03/2020    Sprain of knee 11/03/2020     Seborrhea 11/03/2020    Positive laboratory testing for human immunodeficiency virus (CMS HCC) 11/03/2020    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    Actinic keratosis 11/03/2020    Multiple actinic keratoses 11/03/2020    Malignant neoplasm of prostate (CMS HCC) 11/03/2020    Incisional hernia 11/03/2020    Fall 11/03/2020    Diverticulitis of colon 11/03/2020    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) 04/19/2020    Acute cystitis without hematuria 04/19/2020    Chest pain 03/24/2020    HIV infection (CMS HCC) 03/24/2020    Generalized anxiety disorder 03/13/2020    Cervicalgia 02/06/2020    Low back pain 02/06/2020    Pain in left knee 11/29/2019    Inflammation of sacroiliac joint (CMS HCC) 02/15/2019    Acute respiratory distress syndrome (ARDS) (CMS HCC) 01/17/2019    Depressive disorder 01/17/2019    Diverticulitis 01/17/2019    Herpes zoster 01/17/2019    Risk of exposure to communicable disease 08/22/2018    Nausea 08/22/2018      No Known Allergies         DISCHARGE MEDICATIONS:     Current Discharge Medication List        CONTINUE  these medications - NO CHANGES were made during your visit.        Details   acetaminophen 325 mg Tablet  Commonly known as: TYLENOL   650 mg, Oral, EVERY 6 HOURS PRN  Refills: 0     amLODIPine 10 mg Tablet  Commonly known as: NORVASC   10 mg, Oral, DAILY  Refills: 0     atorvastatin 80 mg Tablet  Commonly known as: LIPITOR   40 mg, Oral, EVERY MORNING WITH BREAKFAST  Refills: 0     cyanocobalamin 1,000 mcg Tablet  Commonly known as: VITAMIN B 12   1,000 mcg, Oral, EVERY MORNING  Refills: 0     docusate sodium 100 mg Capsule  Commonly known as: COLACE   100 mg, Oral  Refills: 0     ferrous sulfate 325 mg (65 mg iron) Tablet  Commonly known as: FEOSOL   325 mg, Oral, DAILY  Qty: 30 Tablet  Refills: 3     FreeStyle Libre 2 Sensor Kit  Generic drug: flash glucose sensor   To check blood glucose continuously. Change every 14 days  Qty: 6  Each  Refills: 3     gabapentin 100 mg Capsule  Commonly known as: NEURONTIN   300 mg, Oral, 3 TIMES DAILY, For shingle pain  Refills: 0     Genvoya 150-150-200-10 mg Tablet  Generic drug: elviteg-cob-emtri-tenof ALAFEN   1 Tablet, Oral, DAILY  Qty: 90 Tablet  Refills: 1     glipiZIDE 5 mg Tablet  Commonly known as: GLUCOTROL   Take 1 tablet twice daily  Qty: 180 Tablet  Refills: 3     Lantus Solostar U-100 Insulin 100 unit/mL (3 mL) Insulin Pen  Generic drug: insulin glargine   8 Units, Subcutaneous, NIGHTLY  Qty: 15 mL  Refills: 3     levothyroxine 88 mcg Tablet  Commonly known as: SYNTHROID   75 mcg, Oral, EVERY MORNING  Refills: 0     melatonin 3 mg Tablet   6 mg, Oral, NIGHTLY  Refills: 0     Mirtazapine 7.5 mg Tablet  Commonly known as: REMERON   7.5 mg, Oral, NIGHTLY  Refills: 0     MULTIVITAMIN ORAL   1 Tablet, Oral, DAILY  Refills: 0     pantoprazole 40 mg Tablet, Delayed Release (E.C.)  Commonly known as: PROTONIX   40 mg, Oral, DAILY  Refills: 0     Pen Needle (Disposable) 32 gauge x 5/32" Needle   Use one pen needle with daily Lantus injection.  Qty: 100 Each  Refills: 3     sennosides-docusate sodium 8.6-50 mg Tablet  Commonly known as: SENOKOT-S   1 Tablet, Oral, 2 TIMES DAILY  Refills: 0            STOP taking these medications.      naproxen 500 mg Tablet  Commonly known as: NAPROSYN     oxyCODONE 5 mg Tablet  Commonly known as: ROXICODONE            ASK your doctor about these medications.        Details   valACYclovir 1 gram Tablet  Commonly known as: VALTREX  Ask about: Should I take this medication?   1 g, Oral, 3 TIMES DAILY  Qty: 21 Tablet  Refills: 0            Discharge med list refreshed?  YES        DISCHARGE INSTRUCTIONS:  Post-Discharge Follow Up Appointments  Monday Jul 25, 2022    Return Patient Visit with Dietitian Visit, Ccm at  9:00 AM      Tuesday Aug 09, 2022    Return Patient Visit with Odette Fractionalmateer, Karen, [place] at  9:30 AM      Friday Aug 26, 2022    Return Patient Visit with  Thad Rangeravindra, Sindhura, MD at 11:00 AM      Monday Oct 24, 2022    Lab with LAB CCM MED ONC at 10:00 AM    Return Patient Visit with Gwendel HansonHussain, Sabir, MD at 10:15 AM      [address]Dietician Services, Medical Office Building C  Medical Office Building [facility], [place]  [address]  [place] [phone]  [phone] Endocrinology, [facility]  [facility], [place]  [address]  [place] [phone]  [phone] Hematology/Oncology, Medical Office Building B  Medical Office Building B, [place]  [address]  [place] [phone]  [phone]    Laboratory Services, Medical Office Building B  Medical Office Building B, [place]  [address]  [place] [phone] [place] [facility]  [address]  [place] [phone]  [phone]          No discharge procedures on file.       REASON FOR HOSPITALIZATION AND HOSPITAL COURSE:    [name] is a 77 y.o., male with PMH of HIV, HSV, shingles, Prostate cancer, DVT/PE, HTN, HLD, DM2, hypothyroidism, GERD, & insomnia who presented with c/o BLE weakness & unsteady gait after recently starting insulin; found to have hypercalcemia.     Hypercalcemia, improving (Ca < 12)  2/2 immobility?   iCa elevated, Vit-D WNL (31), PTH low (5.8), phos & Mg WNL   TSH, Lipase level ordered   EKG: unremarkable  Continue IV fluid   Generalized weakness  PT/OT  Maintain fall precautions    AKI   Cr 1.63 (1.77 on dm, baseline unknown but probably around 1.15)   Continue IV fluid for now  Abdominal pain & constipation   Likely d/t the hypercalcemia & chronic opioid use   CT A/P shows large amount of stool with no sign of obstruction   Lactulose q2h until have BM, also continue the IV fluid   Leukocytosis   UA unremarkable; CT chest shows evidence of lower lung densities (infiltrate vs atelectasis)   Incentive spirometry, ceftriaxone, doxy   DM2: A1c 7.1%, POC glucose within goal, SSI  protocol, glipizide, glargine 8U qhs    Other chronic medical conditions: continue home meds     [facility] for continued care.        Advance Directive Information      Flowsheet Row Most Recent Value   Does the Patient have an Advance Directive? Yes, Patient Does Have Advance Directive for Healthcare Treatment   Copy of Advance Directives in Chart? Yes, Copy on Chart.(Specify in Comment Which Advance Directive)            DISCHARGE DISPOSITION:    DISCHARGE PATIENT   Ordered at: 05/05/22 1449     [facility]     Is there a planned readmission to acute care within 30 days?    No     Disposition:    OTHER INSTITUTION         Darrel ReachLina Zakyra [place], MD    Time spent on discharge greater than 30 minutes?  Yes      Copies sent to Care Team         Relationship Specialty Notifications Start End    [place], [place] [facility] P[facility] - General EXTERNAL  04/03/21     Merged    Phone: [phone] Fax: [phone]         [address] [place] [facility] [address]    [place], [place] [facility]  EXTERNAL  04/03/21     Merged    Phone: [phone] Fax: [phone]         [address] [place] [address]            Referring providers can utilize [email or web address] to access their referred [facility] Medicine patient's information.

## 2022-05-05 NOTE — Care Management Notes (Signed)
[facility]  Care Management Note    Patient Name: [name]  Date of Birth: [birth year]  Sex: male  Date/Time of Admission: 05/04/2022  5:39 PM  Room/Bed: 155/A  Payor: VETERANS ADMIN / Plan: VETERANS ADMINISTRATION / Product Type: Special Bill /    LOS: 1 day   [facility]P: [facility]    Admitting Diagnosis:  Hypercalcemia [E83.52]    Assessment:   Spoke to Davey at the [facility] and she stated that they have beds available and asked if I could fax over his clinicals so the Wellsite geologist could review. Notified the patients wife and she stated that she would like to stay here but cannot afford it if the [place] refuses to pay so she agrees to transfer. Clinicals faxed at this time.     Discharge Plan:  Undetermined at this time      The patient will continue to be evaluated for developing discharge needs.     Case Manager: Keynan Heffern, CASE MANAGER  Phone: [phone]

## 2022-05-05 NOTE — Progress Notes (Signed)
Hospitalist Progress Note     [name] 77 y.o. male   Date of Birth: 1946/01/21  Date of Admit:   05/04/2022   Date of Service: 05/05/2022     Attending: Darrel Reach, MD  Code Status:Full Code   PCP: [facility]   Room:155/A      Assessments/plan:  Hospital Problems (* Primary Problem)    Diagnosis Date Noted    *Hypercalcemia 05/04/2022    Near syncope 03/24/2020    Acute kidney injury (CMS [facility]) 03/24/2020    Pancreatic mass 03/24/2020    GERD without esophagitis 03/24/2020    Essential hypertension 03/24/2020    Acquired hypothyroidism 03/24/2020    Mixed hyperlipidemia 03/24/2020      Resolved Hospital Problems   No resolved problems to display.      Hypercalcemia, improving (Ca < 12)  2/2 immobility?   iCa elevated, Vit-D WNL (31), PTH low (5.8), phos & Mg WNL   TSH, Lipase level ordered   EKG: unremarkable  Continue IV fluid   Generalized weakness  PT/OT  Maintain fall precautions    AKI   Cr 1.63 (1.77 on dm, baseline unknown but probably around 1.15)   Continue IV fluid for now  Abdominal pain & constipation   Likely d/t the hypercalcemia & chronic opioid use   CT A/P shows large amount of stool with no sign of obstruction   Lactulose q2h until have BM, also continue the IV fluid   Leukocytosis   UA unremarkable; CT chest shows evidence of lower lung densities (infiltrate vs atelectasis)   Incentive spirometry, ceftriaxone, doxy   DM2: A1c 7.1%, POC glucose within goal, SSI protocol, glipizide, glargine 8U qhs    Other chronic medical conditions: continue home meds     Subjective   Hospital Course Summary:  [name] is a 77 y.o. male with PMH of HIV, HSV, shingles, Prostate cancer, DVT/PE, HTN, HLD, DM2, hypothyroidism, GERD, & insomnia who presented with c/o BLE weakness & unsteady gait after recently starting insulin; found to have hypercalcemia    Subjective history:  Patient seen and examined at bedside. Pt reports having no BM x 3 days. He also reports diffuse abd  pain/cramping. Denies fever, chills, night sweats, dysuria, hematuria, confusion, paresthesia    Medical History     PMHx:    Past Medical History:   Diagnosis Date    Cancer (CMS HCC)     prostate    Deep vein thrombosis (DVT) (CMS HCC)     right leg    Esophageal reflux     H/O hearing loss     High cholesterol     History of kidney disease     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    HTN (hypertension)     Human immunodeficiency virus (HIV) disease (CMS HCC)     Hyperlipidemia     "borderline"    Hypothyroidism     MRSA (methicillin resistant staph aureus) culture positive 01/02/2021    MRSA left groin abscess 01/03/21    MRSA (methicillin resistant staph aureus) culture positive 01/02/2021    MRSA blood 01/02/21    Pulmonary embolism (CMS HCC) 03/31/2021    Thyroid disorder     Wears glasses      Allergies:    No Known Allergies   Social History  Social History     Tobacco Use    Smoking status: Never    Smokeless tobacco: Never   Vaping  Use    Vaping status: Never Used   Substance Use Topics    Alcohol use: Not Currently    Drug use: Never     Family History  Family Medical History:       Problem Relation (Age of Onset)    Cancer Mother, Father, Other    Heart Attack Mother    High Cholesterol Other    Stroke Other         Home Meds:   Mirtazapine, Pen Needle (Disposable), acetaminophen, amLODIPine, atorvastatin, cholecalciferol (vitamin D3), cyanocobalamin, docusate sodium, elviteg-cob-emtri-tenof ALAFEN, ferrous sulfate, flash glucose sensor, gabapentin, glipiZIDE, insulin glargine, levothyroxine, melatonin, multivitamin, oxyCODONE, pantoprazole, polyethylene glycol, and sennosides-docusate sodium           Objective    Objective Findings     Physical Exam:  BP 117/88   Pulse (!) 109   Temp 36.7 C (98.1 F)   Resp 17   Ht 1.778 m ( )   Wt 61.9 kg (136 lb 7.4 oz)   SpO2 90%   BMI 19.58 kg/m    General:  Chronically ill-appearing but in no acute distress, vital signs reviewed  HEENT: no scleral  icterus, no conjunctival injection, MMM  Resp: normal WOB, CTAB, no r/r/w  CV: RRR, nlS1S2, no m/r/g  GI: +BS, soft, NT, ND   Ext: dry, warm, no c/c/e  Neuro:  somnolent but oriented, no focal deficits appreciated     Inpatient Medications  acetaminophen (TYLENOL) tablet, 500 mg, Oral, Q4H PRN  amLODIPine (NORVASC) tablet, 10 mg, Oral, Daily  atorvastatin (LIPITOR) tablet, 40 mg, Oral, Daily with Breakfast  Correction/SSIP insulin lispro (HumaLOG) 100 units/mL injection pen, 0-18 Units, Subcutaneous, 4x/day AC  dextrose (GLUTOSE) 40% oral gel, 15 g, Oral, Q15 Min PRN  dextrose 50% (0.5 g/mL) injection - syringe, 12.5 g, Intravenous, Q15 Min PRN  elvitegravir-cobicistat-emtricitrabine-tenofovir (GENVOYA) 150-150-200-10 mg per tablet, 1 Tablet, Oral, Daily  gabapentin (NEURONTIN) capsule, 300 mg, Oral, 3x/day  glipiZIDE (GLUCOTROL) tablet, 5 mg, Oral, 2x/day AC  glucagon injection 1 mg, 1 mg, IntraMUSCULAR, Once PRN  heparin 5,000 unit/mL injection, 5,000 Units, Subcutaneous, Q8HRS  insulin glargine-yfgn 100 Units/mL SubQ pen, 8 Units, Subcutaneous, NIGHTLY  lactulose (ENULOSE) 20g per 30mL oral liquid, 30 mL, Oral, Q2H  levothyroxine (SYNTHROID) tablet, 75 mcg, Oral, QAM  magnesium hydroxide (MILK OF MAGNESIA)  per 5mL oral liquid, 15 mL, Oral, Daily PRN  mirtazapine (REMERON) tablet, 7.5 mg, Oral, NIGHTLY  NS flush syringe, 3 mL, Intracatheter, Q8HRS  NS flush syringe, 3 mL, Intracatheter, Q1H PRN  NS flush syringe, 3 mL, Intracatheter, Q8HRS  NS flush syringe, 3 mL, Intracatheter, Q1H PRN  NS premix infusion, , Intravenous, Continuous  ondansetron (ZOFRAN) 2 mg/mL injection, 4 mg, Intravenous, Q6H PRN  pantoprazole (PROTONIX) delayed release tablet, 40 mg, Oral, Daily      Summary of Lab Work and Diagnostic Studies:   I/O last 24 hours:    Intake/Output Summary (Last 24 hours) at 05/05/2022 0935  Last data filed at 05/05/2022 0900  Gross per 24 hour   Intake 1680 ml   Output 1500 ml   Net 180 ml     Labs:  CBC    Recent Labs     05/04/22  1902 05/05/22  0413   WBC 10.9 11.6*   HGB 12.4* 12.9*   HCT 38.8* 40.6   PLTCNT 302 305      Chemistries   Recent Labs     05/04/22  1902 05/05/22  0413  SODIUM 134* 137   POTASSIUM 4.1 4.2   CHLORIDE 93* 97   CO2 32* 29   BUN 34* 29*   CREATININE 1.77* 1.63*   CALCIUM 13.2* 11.9*   ALBUMIN 2.5* 2.4*   MAGNESIUM  --  1.9   PHOSPHORUS 3.4 3.2       Liver Enzymes   Recent Labs     05/04/22  1902 05/05/22  0413   TOTALPROTEIN 6.5  --    ALBUMIN 2.5* 2.4*   AST 24  --    ALT 31  --    ALKPHOS 89  --       Inflammatory Markers     CRP   CRP INFLAMMATION   Date Value Ref Range Status   06/24/2021 22.4 (H) <8.0 mg/L Final   06/22/2021 32.9 (H) <8.0 mg/L Final   06/14/2021 22.9 (H) <8.0 mg/L Final   06/11/2021 29.4 (H) <8.0 mg/L Final   06/07/2021 31.6 (H) <8.0 mg/L Final   06/07/2021 32.2 (H) <8.0 mg/L Final      ESR   ERYTHROCYTE SEDIMENTATION RATE (ESR)   Date Value Ref Range Status   06/11/2021 6 0 - 15 mm/hr Final            Cardiac and Coags   @TROPONIN  I  Lab Results   Component Value Date    TROPONINI 8 06/21/2021    TROPONINI <7 04/09/2021    TROPONINI 13 04/04/2021          Lab Results   Component Value Date    INR 0.97 05/04/2022       Lipid Panel   Lab Results   Component Value Date    CHOLESTEROL 177 02/02/2022    HDLCHOL 36 (L) 02/02/2022    LDLCHOL 77 02/02/2022    LDLCHOLDIR 89 03/24/2020    TRIG 318 (H) 02/02/2022         Lab Results   Component Value Date    HA1C 7.1 (H) 03/28/2022       Microbiology:  No results found for any visits on 05/04/22 (from the past 96 hour(s)).  Imaging:    Results for orders placed or performed during the hospital encounter of 05/04/22 (from the past 24 hour(s))   CT BRAIN WO IV CONTRAST     Status: None    Narrative    Male, 77 years old.    CT BRAIN WO IV CONTRAST performed on 05/04/2022 9:07 PM.    REASON FOR EXAM:  INJURY/TRAUMA    Radiation dose: Total DLP 1240 mGy*cm    This CT scanner is equipped with dose reducing technology. The exposure is  automatically adjusted according to patient body size in order to deliver the lowest dose possible.    Technique: 5 mm axial images were obtained through the brain without intravenous contrast. Multiplanar reconstructions were performed.    Results: There is moderate to prominent central and cortical atrophy consistent with the patient's age. There is no evidence of acute infarct or intracranial hemorrhage. No extra-axial fluid collections are seen. There is no shift of midline structures. No intracranial masses are appreciated.    There is no calvarial fracture.      Impression    1. Moderate central and cortical atrophy consistent with the patient's age.  2. No acute intracranial process.  3. No acute posttraumatic findings.  4. The patient does have acute sinus inflammatory change including some mucosal thickening within the left ethmoid air cells and fluid filling much of  the left maxillary sinus             Radiologist location ID: [id number]     CT CERVICAL SPINE WO IV CONTRAST     Status: None    Narrative    Male, 77 years old.    CT CERVICAL SPINE WO IV CONTRAST performed on 05/04/2022 9:08 PM.    REASON FOR EXAM:  INJURY/TRAUMA    CT Dose:  325 DLP (mGy*cm)  This CT scanner is equipped with dose reducing technology. The mAs is automatically adjusted to patient's body size in order to deliver the lowest dose possible.    FINDINGS: Axial CT imaging of the cervical spine was performed along with sagittal and coronal reformats. Comparison is made with a prior CT dated 06/21/2021. The bony structures are osteopenic. Vertebral body heights are maintained. There is reversal the normal cervical lordosis is slight anterior positioning of C4 on 5. The alignment is unchanged. There is loss of disc height, bony spurring and facet arthrosis at the lower levels which is also stable. The odontoid process is intact. The paravertebral soft tissues show no acute process.      Impression    1. Multilevel degenerative change  is noted and stable. There is no acute bony process detected.      Radiologist location ID: [id number]     CT CHEST WO IV CONTRAST     Status: None    Narrative    Male, 77 years old.    CT CHEST WO IV CONTRAST performed on 05/04/2022 9:17 PM.    REASON FOR EXAM:  INJURY/TRAUMA    CT Dose:  325 DLP (mGy*cm)  This CT scanner is equipped with dose reducing technology. The mAs is automatically adjusted to patient's body size in order to deliver the lowest dose possible.    FINDINGS: Axial unenhanced CT imaging of the chest was performed along with reformats per the trauma protocol. Comparison made prior study dated 04/21/2022. There has been interval increase in the bilateral posterior parenchymal densities with a combination of linear and somewhat consolidative appearance likely represent atelectasis superimposed on patient's chronic interstitial change and scarring. There is also small amount of chronic interstitial change at the right middle lobe. There is no pneumothorax or pleural effusion. The heart is enlarged but stable. There is no significant pericardial effusion. No hilar or mediastinal adenopathy is detected.    The bony structures remain osteopenic. There is a severe compressive deformity at T8 which is unchanged from prior exam as this is essentially a vertebra plana. No new compressive deformity or change in alignment is seen throughout the spine. Sternum is intact. The ribs show no displaced fracture.      Impression    1. Interval increase in parenchymal densities posteriorly in the lower lung zones likely ribs and in atelectasis or dense infiltrate superimposed on patient's chronic interstitial change.  2. There is a significant compressive deformity at T8 which has been present at least dating back to 06/21/2021. There is no retropulsion or central canal encroachment.  3. Cardiomegaly and trace pericardial effusion are noted, stable      Radiologist location ID: [id number]     CT ABDOMEN PELVIS WO IV  CONTRAST     Status: None    Narrative    Male, 77 years old.    CT ABDOMEN PELVIS WO IV CONTRAST performed on 05/04/2022 9:17 PM.    REASON FOR EXAM:  TRAUMA    CT Dose:  605 DLP (mGy*cm)  This CT scanner is equipped with dose reducing technology. The mAs is automatically adjusted to patient's body size in order to deliver the lowest dose possible.    FINDINGS: Axial unenhanced CT imaging of the abdomen and pelvis was performed along with reformats per the trauma protocol. Comparison is made with prior study dated 06/21/2021. Multiple cystic areas are seen within the liver which are stable. No suspicious hepatic lesion or ductal dilatation is seen. The gallbladder, adrenal glands and kidneys show no acute process or significant abnormality. The spleen is surgically absent.    At the head of the pancreas there is a cystic process which has increased in size since the prior study. Currently this measures up to 3.5 cm. This is limited in evaluation due to the lack of contrast material there is no pancreatic ductal dilatation. The distal pancreas has been resected.    The small and large bowel show no obstructive change. A large volume of formed stool was noted throughout colon. No bowel wall thickening or surrounding inflammation is seen. There is wide rectus diastases/ventral hernia. No free fluid or air is seen. Urinary bladder is distended symmetrically. IVC filter is in place. Metallic fiducials are noted at the level of the prostate.    The bony structures are diffusely osteopenic. There are degenerative changes without an acute bony process.      Impression    1. No evidence of acute/posttraumatic change within the abdomen or pelvis.  2. Interval increase in size of the cystic lesion at the head of the pancreas measuring up to 3.5 cm.  3. Large volume of formed stool is noted within the colon suggestive of constipation although the exact etiology is indeterminate.  4. ASCVD.  5. Rectus diastases.  6. Bony  structures show no acute process.      Radiologist location ID: [id number]            Nutrition: DIET RENAL DIABETIC HIGH PROTEIN Carb Amount: 1800 Cal  = 75 Carbs per meal - 5 Carb choices  DVT PPx:  Heparin   Code Status: Full Code    Disposition: transfer to [facility] hospital       Darrel Reach, MD  This note may have been partially generated using MModal Fluency Direct system and there may be some incorrect words, spellings, and punctuation that were not noted in checking the note before saving.

## 2022-05-05 NOTE — Care Plan (Signed)
[facility]  Rehabilitation Services  Physical Therapy Initial Evaluation    Patient Name: [name]  Date of Birth: 07/19/1945  Height: Height: 177.8 cm (5\' 10" )  Weight: Weight: 61.9 kg (136 lb 7.4 oz)  Room/Bed: 155/A  Payor: [employer] / Plan: [employer] / Product Type: Special Bill /     Assessment:      (P) Patient is a 77 year old male presented to hospital secondary to hypercalemia, multiple falls and generalized weakness. Patient reporting he lives at home with spouse and previously independent in home and community. Patient declined OOB activities secondary to complaints of generalized fatigue. Patient to benefit from skilled PT services to increase activity tolerance, decrease risk for falls and improve functional mobility.    Discharge Needs:    Equipment Recommendation: (P) TBD      The patient presents with mobility limitations due to impaired balance, impaired strength, and impaired functional activity tolerance that significantly impair/prevent patient's ability to participate in mobility-related activities of daily living (MRADLs) including  ambulation and transfers in order to safely complete, toileting, bathing, safely entering/exiting the home. This functional mobility deficit can be sufficiently resolved with the use of a (P) TBD  in order to decrease the risk of falls, morbidity, and mortality in performance of these MRADLs.  Patient is able to safely use this assistive device.    Discharge Disposition: (P) TBD, skilled nursing facility    JUSTIFICATION OF DISCHARGE RECOMMENDATION   Based on current diagnosis, functional performance prior to admission, and current functional performance, this patient requires continued PT services in (P) TBD, skilled nursing facility in order to achieve significant functional improvements in these deficit areas: (P) aerobic capacity/endurance, ergonomics and body mechanics, gait, locomotion, and balance, motor  function, muscle performance, neuromuscular.        Plan:   Current Intervention: (P) balance training, bed mobility training, gait training, home exercise program, motor coordination training, neuromuscular re-education, patient/family education, postural re-education, strengthening, transfer training  To provide physical therapy services (P) minimum of 1x/week  for duration of (P) until discharge.    The risks/benefits of therapy have been discussed with the patient/caregiver and he/she is in agreement with the established plan of care.       Subjective & Objective        05/05/22 1345   Rehab Session   Document Type evaluation   PT Visit Date 05/05/22   Total PT Minutes: 12   General Information   Patient Profile Reviewed yes   Onset of Illness/Injury or Date of Surgery 05/04/22   Patient/Family/Caregiver Comments/Observations Patient lying supine in bed upon PT arrival, patient spouse present during visit. Patient cleared for activity by RN. Patient in room 155, identified by name and wrist band.   Pertinent History of Current Functional Problem Patient presented to hospital secondary to hypercalcemia, multiple falls and complaints of generalized weakness.   Medical Lines PIV Line   Respiratory Status nasal cannula   Existing Precautions/Restrictions fall precautions;full code   Living Environment   Lives With spouse   Living Arrangements house   Functional Level Prior   Ambulation 0 - independent   Transferring 0 - independent   Toileting 0 - independent   Bathing 0 - independent   Dressing 0 - independent   Prior Functional Level Comment Reporting previously independent without A.D.   Pre Treatment Status   Pre Treatment Patient Status Patient supine in bed;Call light within reach;Telephone within reach  Communication Pre Treatment  Nurse   Cognitive Assessment/Interventions   Behavior/Mood Observations behavior appropriate to situation, WNL/WFL;alert;cooperative   Orientation Status oriented x 4   Attention  WNL/WFL   Follows Commands WNL   RLE Assessment   RLE Assessment X-Exceptions   RLE Strength grossly impaired   LLE Assessment   LLE Assessment X-Exceptions   LLE Strength grossly impaired   Bed Mobility Assessment/Treatment   Supine-Sit Independence not tested   Comment declined out of bed activities   Transfer Assessment/Treatment   Sit-Stand Independence not tested   Gait Assessment/Treatment   Independence  not tested   Post Treatment Status   Post Treatment Patient Status Patient supine in bed;Call light within reach;Telephone within reach   Support Present Post Treatment  Family present   Communication Post Treatement Nurse   Plan of Care Review   Plan Of Care Reviewed With patient   Basic Mobility Am-PAC/6Clicks Score (APPROVED Staff)   Turning in bed without bedrails 1   Lying on back to sitting on edge of flat bed 1   Moving to and from a bed to a chair 1   Standing up from chair 1   Walk in room 1   Climbing 3-5 steps with railing 1   6 Clicks Raw Score total 6   Standardized (t-scale) score 16.59   Physical Therapy Clinical Impression   Assessment Patient is a 77 year old male presented to hospital secondary to hypercalemia, multiple falls and generalized weakness. Patient reporting he lives at home with spouse and previously independent in home and community. Patient declined OOB activities secondary to complaints of generalized fatigue. Patient to benefit from skilled PT services to increase activity tolerance, decrease risk for falls and improve functional mobility.   Patient/Family Goals Statement to get better   Criteria for Skilled Therapeutic yes   Pathology/Pathophysiology Noted musculoskeletal   Impairments Found (describe specific impairments) aerobic capacity/endurance;ergonomics and body mechanics;gait, locomotion, and balance;motor function;muscle performance;neuromuscular   Functional Limitations in Following  self-care;home management   Rehab Potential good   Therapy Frequency minimum of  1x/week   Predicted Duration of Therapy Intervention (days/wks) until discharge   Anticipated Equipment Needs at Discharge (PT) TBD   Anticipated Discharge Disposition TBD;skilled nursing facility   Evaluation Complexity Justification   Patient History: Co-morbidity/factors that impact Plan of [facility] or more other medical co-morbidity   Examination Components 1-2 Exam elements addressed   Presentation Stable: Uncomplicated, straight-forward, problem focused   Clinical Decision Making Low complexity   Evaluation Complexity Low complexity   Care Plan Goals   PT Rehab Goals Physical Therapy Goal;Bed Mobility Goal;Gait Training Goal;Transfer Training Goal   Physical Therapy Goal   PT  Goal, Date Established 05/05/22   PT Goal, Time to Achieve by discharge   PT Goal, Activity Type Patient to tolerate >/= 15 min therapeutic exercise to increase circulation, joint mobility and strength   PT Goal, Independence Level supervision required   Bed Mobility Goal   Bed Mobility Goal, Date Established 05/05/22   Bed Mobility Goal, Time to Achieve by discharge   Bed Mobility Goal, Activity Type all bed mobility activities   Bed Mobility Goal, Independence Level stand-by assistance   Bed Mobility Goal, Assistive Device least restrictive assistive device   Gait Training  Goal, Distance to Achieve   Gait Training  Goal, Date Established 05/05/22   Gait Training  Goal, Time to Achieve by discharge   Gait Training  Goal, Independence Level stand-by assistance  Gait Training  Goal, Assist Device least restricted assistive device;walker, rolling   Gait Training  Goal, Distance to Achieve 200 ft   Transfer Training Goal   Transfer Training Goal, Date Established 05/05/22   Transfer Training Goal, Time to Achieve by discharge   Transfer Training Goal, Activity Type sit-to-stand/stand-to-sit   Transfer Training Goal, Independence Level stand-by assistance   Transfer Training Goal, Assist Device least restrictrictive assistive  device;walker, rolling   Planned Therapy Interventions, PT Eval   Planned Therapy Interventions (PT) balance training;bed mobility training;gait training;home exercise program;motor coordination training;neuromuscular re-education;patient/family education;postural re-education;strengthening;transfer training       Therapist:   Caryl Bis, [place] [id number]    Start Time: 1345  End Time: 1357  Evaluation Time: 12 minutes  Charges Entered: EVAL  Department Number: [id number]

## 2022-05-09 LAB — OPIATES CONFIRMATORY/DEFINITIVE, URINE, BY LC-MS/MS (PERFORMABLE)
CODEINE: NOT DETECTED ng/mL (ref <25)
DIHYDROCODEINE: NOT DETECTED ng/mL (ref <25)
HYDROCODONE: NOT DETECTED ng/mL (ref <25)
HYDROMORPHONE: NOT DETECTED ng/mL (ref <25)
MORPHINE: NOT DETECTED ng/mL (ref <25)
NORHYDROCODONE: NOT DETECTED ng/mL (ref <25)
NOROXYCODONE: 93 ng/mL — ABNORMAL HIGH (ref <25)
OXYCODONE: 330 ng/mL — ABNORMAL HIGH (ref <25)
OXYMORPHONE: 958 ng/mL — ABNORMAL HIGH (ref <25)

## 2022-05-13 ENCOUNTER — Other Ambulatory Visit (INDEPENDENT_AMBULATORY_CARE_PROVIDER_SITE_OTHER): Payer: Self-pay | Admitting: Family

## 2022-05-13 DIAGNOSIS — D490 Neoplasm of unspecified behavior of digestive system: Secondary | ICD-10-CM

## 2022-05-13 NOTE — Addendum Note (Signed)
Addended byScarlette Shorts on: 05/13/2022 03:46 PM     Modules accepted: Orders

## 2022-05-19 ENCOUNTER — Ambulatory Visit (INDEPENDENT_AMBULATORY_CARE_PROVIDER_SITE_OTHER): Payer: 59 | Admitting: INFECTIOUS DISEASE

## 2022-05-19 ENCOUNTER — Encounter (INDEPENDENT_AMBULATORY_CARE_PROVIDER_SITE_OTHER): Payer: Self-pay | Admitting: INFECTIOUS DISEASE

## 2022-05-19 ENCOUNTER — Other Ambulatory Visit: Payer: Self-pay

## 2022-05-19 VITALS — BP 118/79 | HR 109

## 2022-05-19 DIAGNOSIS — B9562 Methicillin resistant Staphylococcus aureus infection as the cause of diseases classified elsewhere: Secondary | ICD-10-CM | POA: Insufficient documentation

## 2022-05-19 DIAGNOSIS — Z21 Asymptomatic human immunodeficiency virus [HIV] infection status: Secondary | ICD-10-CM

## 2022-05-19 MED ORDER — SULFAMETHOXAZOLE 800 MG-TRIMETHOPRIM 160 MG TABLET
1.0000 | ORAL_TABLET | Freq: Two times a day (BID) | ORAL | 0 refills | Status: AC
Start: 2022-05-19 — End: 2022-05-26

## 2022-05-19 MED ORDER — MUPIROCIN 2 % TOPICAL OINTMENT
TOPICAL_OINTMENT | Freq: Two times a day (BID) | CUTANEOUS | 0 refills | Status: AC
Start: 2022-05-19 — End: 2022-05-26

## 2022-05-19 NOTE — Nursing Note (Signed)
Reports boils on both buttocks causing pain. Also other recent boils. Recent hospitalization at the [facility] caused due to constipation. Those side effects have resolved. Fell after getting home from hospital. Denies fevers and pain. Reports some nausea, but denies vomiting and abdominal pain. No current antibiotics. Atha Starks, LPN

## 2022-05-19 NOTE — Progress Notes (Signed)
Return Patient Visit         CHIEF COMPLAINT: HIV Follow-up       HPI: HIV infection History of MRSA bacteremia secondary skin and soft tissue infection. Patient is on Genvoya, stable HIV VL and CD4 count. 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     REVIEW OF SYSTEMS:  Negative except as noted in HPI    MEDICATIONS:  Current Outpatient Medications   Medication Sig    acetaminophen (TYLENOL) 325 mg Oral Tablet Take 2 Tablets (650 mg total) by mouth Every 6 hours as needed for Pain    amLODIPine (NORVASC) 10 mg Oral Tablet Take 1 Tablet (10 mg total) by mouth Once a day    atorvastatin (LIPITOR) 80 mg Oral Tablet Take 0.5 Tablets (40 mg total) by mouth Every morning with breakfast    cyanocobalamin (VITAMIN B 12) 1,000 mcg Oral Tablet Take 1 Tablet (1,000 mcg total) by mouth Every morning    docusate sodium (COLACE) 100 mg Oral Capsule Take 1 Capsule (100 mg total) by mouth    elviteg-cob-emtri-tenof ALAFEN (GENVOYA) 150-150-200-10 mg Oral Tablet Take 1 Tablet by mouth Once a day    ferrous sulfate (FEOSOL) 325 mg (65 mg iron) Oral Tablet Take 1 Tablet (325 mg total) by mouth Once a day    flash glucose sensor (FREESTYLE LIBRE 2 SENSOR) Does not apply Kit To check blood glucose continuously. Change every 14 days    gabapentin (NEURONTIN) 100 mg Oral Capsule Take 3 Capsules (300 mg total) by mouth Three times a day For shingle pain    glipiZIDE (GLUCOTROL) 5 mg Oral Tablet Take 1 tablet twice daily    insulin glargine (LANTUS SOLOSTAR U-100 INSULIN) 100 unit/mL Subcutaneous Insulin Pen Inject 8 Units under the skin Every night    levothyroxine (SYNTHROID) 88 mcg Oral Tablet Take 75 mcg by mouth Every morning    melatonin 3 mg Oral Tablet Take 2 Tablets (6 mg total) by mouth Every night    Mirtazapine (REMERON) 7.5 mg Oral Tablet Take 1 Tablet (7.5 mg total) by mouth Every night    MULTIVITAMIN ORAL Take 1 Tablet by mouth Once a day    nystatin (MYCOSTATIN) 100,000 unit/mL Oral Suspension  SWISH BY MOUTH FOR 2 MINUTES AND THEN SPIT. DO NOT RINSE. REPEAT 4-5 TIMES A DAY    pantoprazole (PROTONIX) 40 mg Oral Tablet, Delayed Release (E.C.) Take 1 Tablet (40 mg total) by mouth Once a day    Pen Needle, Disposable, 32 gauge x 5/32" Needle Use one pen needle with daily Lantus injection.    sennosides-docusate sodium (SENOKOT-S) 8.6-50 mg Oral Tablet Take 1 Tablet by mouth Twice daily       ANTIRETROVIRAL REGIMEN:  Genvoya    PHYSICAL EXAM:  General: In no acute distress.  Does not look acutely or chronically ill  Eyes: No scleral icterus or conjunctival injection  Mental status: Alert and oriented x 3.Cooperative with normal cognition.  Cardiovascular: No cyanosis. No edema of LE  Respiratory: Normal respiratory effort. No accessory muscle use.   Skin: b/l gluteal area pimple like lesion     VITALS:  Vitals:    05/19/22 1040   BP: 118/79   Pulse: (!) 109        CD4 # (HELPER T CELL) (cells/uL)   Date Value   02/02/2022 1,130   07/29/2021 1,150     CD4 % (HELPER T CELL) (%)   Date Value   02/02/2022 20  07/29/2021 18     HIV1 RNA VIRAL LOAD (no units)   Date Value   02/02/2022 Target Not Detected   07/29/2021 Target Not Detected       Immune Status Pos/Neg/Date   HEPATITIS C ANTIBODY HCV ANTIBODY QUALITATIVE   Date Value Ref Range Status   03/30/2021 Negative Negative Final     Comment:     Hepatitis Serology Methods = Chemiluminescent immunoassay performed on [employer] equipment          SCREENINGS RESULT   LIPID PANEL TRIGLYCERIDES   Date Value Ref Range Status   02/02/2022 318 (H) 0 - 149 mg/dL Final     CHOLESTEROL   Date Value Ref Range Status   02/02/2022 177 0 - 199 mg/dL Final     HDL CHOL   Date Value Ref Range Status   02/02/2022 36 (L) 40 - 60 mg/dL Final     LDL CALC   Date Value Ref Range Status   02/02/2022 77 0 - 130 mg/dL Final       RPR (ANNUAL) 02/02/22 Negative   QUANTIFERON (ANNUAL) 02/02/22 Negative       Health Maintenance Due   Topic Date Due    Diabetic Retinal Exam  Never done     Osteoporosis screening  Never done    Adult Tdap-Td (1 - Tdap) Never done    Medicare Annual Wellness Visit  Never done    Shingles Vaccine (2 of 2) 04/24/2018    Meningococcal B Vaccine (2 of 4 - Increased Risk Bexsero 2-dose series) 04/01/2021    Meningococcal Vaccine (2 - Risk 2-dose series) 04/29/2021    Covid-19 Vaccine (3 - Moderna risk series) 12/07/2021    Pneumococcal Vaccination, Age 85+ (2 of 2 - PPSV23 or PCV20) 03/04/2022        Immunization History   Administered Date(s) Administered    Covid-19 Vaccine,Moderna Bivalent 01/07/2021    Covid-19 Vaccine,Moderna(Spikevax),71mcg/0.5mL(2023-2024),12 yr+ 11/09/2021    HAEMOPHILUS B CONJUGATE VACCINE 03/04/2021    HEPATITIS B VIRUS RECOMB VACCINE 10/11/2013, 11/18/2013, 02/18/2014    High-Dose Influenza Vaccine, 65+ 11/06/2018    Influenza Vaccine, 6 month-adult 11/18/2013, 10/27/2014, 10/16/2015, 10/17/2016, 11/03/2017    Meningococcal B Vaccine - 2 Dose 03/04/2021    Meningococcal Vaccine 03/04/2021    PREVNAR 13 12/14/2012, 01/14/2014    Shingrix - Zoster Vaccine 02/27/2018    VAXNEUVANCE 03/04/2021       HIV:      ANTIRETROVIRAL REGIMEN:  Genvoya     ADHERENCE:     stable, continue RX,    ADHERENCE COUNSELING:  YES    Antimicrobial prophylaxis:  N/A    Sexual Activity: Monogamous, wife.         ASSESSMENT/PLAN    ICD-10-CM    1. Asymptomatic HIV infection, with no history of HIV-related illness (CMS HCC)  Z21       2. Infection of skin due to methicillin resistant Staphylococcus aureus (MRSA)  L08.9     B95.62             HIV  History of MRSA bacteremia secondary skin and soft tissue infection.   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 Denies recent outbreaks in genitals.     Patient stable on Genvoya. Tolerating well. Denies missed doses.   Encouraged to increase hydration. Creatinine improved from previous labs.   Vaccines are up to date  Follows with [facility] for primary care    MRSA skin soft tissue  infection, gluteal  abdomen patient self picked. Painful lesion on b/l buttock area   Will prescribe bactrim and apply bactroban ointement         PATIENT INSTRUCTIONS:  There are no Patient Instructions on file for this visit.    FOLLOW UP  No follow-ups on file.    Nicki Guadalajara, MD

## 2022-05-23 ENCOUNTER — Other Ambulatory Visit (INDEPENDENT_AMBULATORY_CARE_PROVIDER_SITE_OTHER): Payer: Self-pay | Admitting: INFECTIOUS DISEASE

## 2022-05-23 DIAGNOSIS — B009 Herpesviral infection, unspecified: Secondary | ICD-10-CM

## 2022-05-23 MED ORDER — VALACYCLOVIR 1 GRAM TABLET
1000.0000 mg | ORAL_TABLET | Freq: Three times a day (TID) | ORAL | 0 refills | Status: AC
Start: 2022-05-23 — End: 2022-05-30

## 2022-05-23 NOTE — Nursing Note (Signed)
Patient called in stating he developed a HSV outbreak over the weekend. Notified Dr. Perrin Smack and new orders for Valtrex 1g PO TID x 7 days received. [facility] [address] per protocol.

## 2022-05-31 ENCOUNTER — Ambulatory Visit (HOSPITAL_COMMUNITY): Payer: Self-pay

## 2022-05-31 ENCOUNTER — Encounter (HOSPITAL_BASED_OUTPATIENT_CLINIC_OR_DEPARTMENT_OTHER): Payer: Self-pay | Admitting: Internal Medicine

## 2022-06-06 ENCOUNTER — Encounter (HOSPITAL_BASED_OUTPATIENT_CLINIC_OR_DEPARTMENT_OTHER): Payer: Self-pay | Admitting: Internal Medicine

## 2022-06-08 ENCOUNTER — Encounter (INDEPENDENT_AMBULATORY_CARE_PROVIDER_SITE_OTHER): Payer: Self-pay | Admitting: SURGICAL ONCOLOGY

## 2022-06-08 ENCOUNTER — Ambulatory Visit (HOSPITAL_BASED_OUTPATIENT_CLINIC_OR_DEPARTMENT_OTHER)
Admission: RE | Admit: 2022-06-08 | Discharge: 2022-06-08 | Disposition: A | Discharge status: Home / Self Care | Payer: 59 | Source: Ambulatory Visit

## 2022-06-08 ENCOUNTER — Other Ambulatory Visit: Payer: Self-pay

## 2022-06-08 ENCOUNTER — Ambulatory Visit: Payer: 59 | Attending: SURGICAL ONCOLOGY | Admitting: SURGICAL ONCOLOGY

## 2022-06-08 VITALS — BP 119/73 | HR 93 | Temp 97.4°F | Ht 70.0 in | Wt 147.5 lb

## 2022-06-08 DIAGNOSIS — D490 Neoplasm of unspecified behavior of digestive system: Secondary | ICD-10-CM

## 2022-06-08 DIAGNOSIS — Z8546 Personal history of malignant neoplasm of prostate: Secondary | ICD-10-CM | POA: Insufficient documentation

## 2022-06-08 DIAGNOSIS — Z9049 Acquired absence of other specified parts of digestive tract: Secondary | ICD-10-CM | POA: Insufficient documentation

## 2022-06-08 DIAGNOSIS — Z9081 Acquired absence of spleen: Secondary | ICD-10-CM | POA: Insufficient documentation

## 2022-06-08 DIAGNOSIS — K862 Cyst of pancreas: Secondary | ICD-10-CM | POA: Insufficient documentation

## 2022-06-08 MED ORDER — GADOPICLENOL 0.5 MMOL/ML INTRAVENOUS SOLUTION
6.0000 mL | INTRAVENOUS | Status: AC
Start: 2022-06-08 — End: 2022-06-08
  Administered 2022-06-08: 6 mL via INTRAVENOUS

## 2022-06-08 NOTE — Progress Notes (Signed)
[facility]   RETURN PATIENT EVALUATION    Patient: [name]  D.O.B.: 02-Jul-1945  MRN# [id number]  Date of Service: 06/08/2022    PCP: [facility]    Subjective:  Carden Byce Boyter is a 77 y.o. 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   The patient noted that the 3 small pancreatic masses in the distal tail was first discovered incidentally post op of his colectomy back in 2006. The patient noted years later (2017) for the treatment of his prostate cancer that his pancreatic mass showed that mass grew into a single 7 cm mass that was found incidently CTAP.  Therefore, he underwent the above procedure.     He presented to his local ED on 05/04/22 due to BLE weakness and unsteady date after starting insulin. He was found to have hypercalcemia. His CT AP on 05/04/22 revealed an increased in size in the cystic lesion at the [facility] measuring up to 3.5cm. Today, he presents with a MRI MRCP. He denies any abdominal pain, n/v, constipation, diarrhea, steatorrhea, or jaundice.     All other systems reviewed are negative, except those listed above. The patient denies any changes to his medical, surgical, family or social history since his visit on 08/25/21.    Objective:  Vitals are BP 119/73   Pulse 93   Temp 36.3 C (97.4 F)   Ht 1.778 m (5\' 10" )   Wt 66.9 kg (147 lb 7.8 oz)   SpO2 94%   BMI 21.16 kg/m     On examination the patient is pleasant, in NAD. Trachea is midline. Heart is RRR without murmurs. Respirations are unlabored and lungs are CTA. No peripheral edema or cyanosis. Abdomen is soft, non-tender and non-distended. Skin has no rashes or jaundice. He is alert and oriented without focal motor or sensory deficits. Speech pattern and mood/affect normal.    Data Reviewed:  05/04/22 CT AP  IMPRESSION:  1. No evidence of acute/posttraumatic change within the abdomen or pelvis.  2. Interval increase in size of the cystic lesion at the  head of the pancreas measuring up to 3.5 cm.  3. Large volume of formed stool is noted within the colon suggestive of constipation although the exact etiology is indeterminate.  4. ASCVD.  5. Rectus diastases.  6. Bony structures show no acute process.    06/08/22 MRI MRCP  Awaiting radiology but reveals HOP cyst that has enlarged from previous scan     Assessment and Plan:  [name] is a 77 y.o. 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   The patient noted that the 3 small pancreatic masses in the distal tail was first discovered incidentally post op of his colectomy back in 2006. The patient noted years later (2017) for the treatment of his prostate cancer that his pancreatic mass showed that mass grew into a single 7 cm mass that was found incidently CTAP.  Therefore, he underwent the above procedure. 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     We reviewed his imaging his imaging today and his prior scans. It does appear that the cyst was present but has enlarged. He has had multiple abdominal surgeries and has had significant complications in the past. We discussed surveillance for now. Pt is agreeable to the plan. Plan for repeat MRI/MRCP in 6 months with RPV.     Patient  was given the opportunity to ask questions, agreed with the treatment plan, and was encouraged to call with any additional questions or concerns.    I independently of the faculty provider spent a total of (15) minutes in direct/indirect care of this patient including initial evaluation, review of laboratory, radiology, diagnostic studies, review of medical record, order entry and coordination of care.  [name], APRN,NP-C, RNFA  The patient was seen with Dr. Lissa Hoard    I personally saw and examined the patient. See Nurse Practitioner's note for additional details.     I reviewed his imaging which shows an enlarging cyst in the head of the pancreas.  I am hopeful this  represents a postoperative pseudocyst or rather than a mucinous pre-malignant IPMN.  I discussed EUS to get cyst fluid and get a definitive diagnosis.  Given everything he has been through and his wife's current medical problems, he would like to follow it with a MRI in 6 months, which is certainly reasonable.  Even if IPMN, resection would require completion pancreatectomy and I would certainly hesitate to do this at this time.  I will see him back in 6 months or sooner if necessary.  All his questions were answered and he agreed with the plan.     [name], MD, FACS

## 2022-06-09 ENCOUNTER — Other Ambulatory Visit (INDEPENDENT_AMBULATORY_CARE_PROVIDER_SITE_OTHER): Payer: Self-pay | Admitting: Internal Medicine

## 2022-06-09 DIAGNOSIS — K862 Cyst of pancreas: Secondary | ICD-10-CM

## 2022-06-09 DIAGNOSIS — K7689 Other specified diseases of liver: Secondary | ICD-10-CM

## 2022-06-09 DIAGNOSIS — N281 Cyst of kidney, acquired: Secondary | ICD-10-CM

## 2022-06-09 DIAGNOSIS — Q453 Other congenital malformations of pancreas and pancreatic duct: Secondary | ICD-10-CM

## 2022-06-09 MED ORDER — FREESTYLE LIBRE 3 SENSOR DEVICE
1.00 | 3 refills | Status: AC
Start: 2022-06-09 — End: ?

## 2022-06-09 MED ORDER — FREESTYLE LIBRE 3 READER
1.0000 | 0 refills | Status: DC
Start: 2022-06-09 — End: 2022-11-16

## 2022-06-09 NOTE — Telephone Encounter (Signed)
Pt. Left voicemail asking to upgrade to libre 3 CGMS.  Would like Rx's to be sent to [facility] in [place].    Maryruth Hancock, RN

## 2022-06-15 ENCOUNTER — Encounter (HOSPITAL_BASED_OUTPATIENT_CLINIC_OR_DEPARTMENT_OTHER): Payer: Self-pay | Admitting: Internal Medicine

## 2022-06-16 ENCOUNTER — Encounter (HOSPITAL_BASED_OUTPATIENT_CLINIC_OR_DEPARTMENT_OTHER): Payer: Self-pay | Admitting: Internal Medicine

## 2022-07-18 ENCOUNTER — Other Ambulatory Visit (INDEPENDENT_AMBULATORY_CARE_PROVIDER_SITE_OTHER): Payer: Self-pay | Admitting: INFECTIOUS DISEASE

## 2022-07-18 MED ORDER — VALACYCLOVIR 1 GRAM TABLET
1000.0000 mg | ORAL_TABLET | Freq: Two times a day (BID) | ORAL | 1 refills | Status: AC
Start: 2022-07-18 — End: 2022-07-25

## 2022-07-18 NOTE — Nursing Note (Signed)
Patient left message over the weekend stating has had another HSV outbreak. Will [facility] per protocol.

## 2022-07-25 ENCOUNTER — Other Ambulatory Visit: Payer: Self-pay

## 2022-07-25 ENCOUNTER — Ambulatory Visit: Payer: 59 | Attending: FAMILY PRACTICE

## 2022-07-25 DIAGNOSIS — E119 Type 2 diabetes mellitus without complications: Secondary | ICD-10-CM | POA: Insufficient documentation

## 2022-07-25 NOTE — Nursing Note (Signed)
07/25/22 0900   Nutrition Management    State most important reason to use a meal plan 2 - Needs Review/Assistance   State how meal spacing helps blood sugar control 3 - Verbalizes/Demonstrates Competency   State how composition of meals affect blood sugar 2 - Needs Review/Assistance   Describe how to keep a food diary 2 - Needs Review/Assistance   State how monitoring amount of food can help reach blood sugar levels 2 - Needs Review/Assistance   Describe personal meal plan 3 - Verbalizes/Demonstrates Competency   Use meal plan to plan meals 2 - Needs Review/Assistance   Use meal plan when eating away from home 3 - Verbalizes/Demonstrates Competency   Identify behaviors to help control weight 3 - Verbalizes/Demonstrates Competency   Explain how alcohol affects blood sugar 1 - Needs Instruction   Use food labels to choose food 2 - Needs Review/Assistance   Discuss benefits/cons of fiber 2 - Needs Review/Assistance   Plan eating/behavior changes working toward personal goals 2 - Needs Review/Assistance   Identify behaviors to help reduce risk of heart disease 2 - Needs Review/Assistance

## 2022-07-25 NOTE — Progress Notes (Signed)
[facility], MEDICAL OFFICE BUILDING [facility]  [address]  [place] [address]  Operated by [facility]     Name: [name] MRN:  [id number]   Date: 07/25/2022 Age: 77 y.o.      Assessment: Pt is here per Dr. Marval Regal for 4rth session due to uncontrolled T2DM with HbA1c 7.8%- 11/12/21. Metformin causing nausea. 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 He reports having a hard time trying new foods. After initial diabetic diagnosis patient threw out all sweets and limits his sweet tea to once every 24hr.     Current Visit: Patient has gained back 11lb from last session with appetite improvement and regular BM. His HbA1c is improved to POC, HBALC 7.1%- 03/28/22. Waiting on back surgery and in severe pain keeping him from golfing that may take place next year.     Fasting Blood Sugar: 81 mg/dL glucose  B- Carn[employer] Instant Breakfast Drink with 2% milk,  oatmeal with sugar-free creamer in coffee or regular flavored oatmeal, shredded wheat  L-  PB & J sandwich  D- Eats Out- 1x weekly [employer] or 1/2 rack spare ribs, potatoes, coleslaw, [employer]- fish entree or pot roast, potatoes, and carrot dish on weekends (400mg /dL glucose after Cr[employer]areel)  S- peanuts or apple  Eats evening snack to get blood sugar at 200-300 mg/dL glucose due to fear of having a low blood sugar event in the night.   Beverages: unsweet tea, 2 16oz. Water bottles/day, sugar-free Gatorade and body armor  Goes to gym 3x weekly.     Vitals:    07/25/22 0903   Weight: 69.9 kg (154 lb)   Height: 1.778 m (5\' 10" )   BMI: 22.14      Labs:  Lab Results   Component Value Date    HA1C 7.1 (H) 03/28/2022      No results found for: "POCTHA1C"   CHOLESTEROL   Date Value Ref Range Status   02/02/2022 177 0 - 199 mg/dL Final      HDL CHOL   Date Value Ref Range Status   02/02/2022 36 (L) 40 - 60 mg/dL Final     LDL CALC   Date Value Ref Range Status   02/02/2022 77 0 - 130 mg/dL Final     TRIGLYCERIDES   Date Value Ref Range Status   02/02/2022 318 (H) 0 - 149 mg/dL Final     VLDL CALC   Date Value Ref Range Status   02/02/2022 64 mg/dL Final     CHOL/HDL RATIO   Date Value Ref Range Status   09/06/2021 4.8  Final     NON-HDL   Date Value Ref Range Status   09/06/2021 127 <=190 mg/dL Final       Estimated Nutrient Needs:  IBW: 166lb (75kg)          Calories: 1875      kcal per day (  25  kcal/    75    kg   IBW    )  Protein: 75-90g/day (1.0-1.2g / 75kg IBW)    Nutrition Diagnosis Altered nutrition related lab values related to uncontrolled T2DM as evidenced by HbA1c 7.8%- 11/12/21.    Intervention:  Discussion time began with patient at 8:51am  and ended at 9:02am  for at total of  .       Education focused on assessment of improved blood sugar and weight status. Overall, patient reports enjoying his dietary regimen and doesn't want to make further changes. He has my office phone number if he has any concerns/questions.     Monitor/Evaluation:     Patient's has gained 11lb from last session with improvement in appetite. Golfing is not occurring due to back pain and need for surgery. He is taking care of his wife. Has switched from regular Gatorade and body armor to sugar-free and enjoys the taste. Overall, patient is happy with weight gain and blood sugar improvments. Has started drinking whole milk.  Did not wish to track food.     Personal Goals:   1) Choose whole milk for weight gain.   2) Use Food log to monitor- time, food, amount, and blood sugar "check 2hr. After a meal" adjust meal if blood sugar is aboce 180mg /dL. Call with questions.   3) Taste test sugar-free Gatorade and sugar-free under armour.     Provided RD contact information.    Constance Haw, RD    Nursing Notes:   Constance Haw, RD  07/25/22 1308  Signed      07/25/22 0900   Nutrition Management    State most important reason to use a meal plan 2 - Needs Review/Assistance   State how meal spacing helps blood sugar control 3 - Verbalizes/Demonstrates Competency   State how composition of meals affect blood sugar 2 - Needs Review/Assistance   Describe how to keep a food diary 2 - Needs Review/Assistance   State how monitoring amount of food can help reach blood sugar levels 2 - Needs Review/Assistance   Describe personal meal plan 3 - Verbalizes/Demonstrates Competency   Use meal plan to plan meals 2 - Needs Review/Assistance   Use meal plan when eating away from home 3 - Verbalizes/Demonstrates Competency   Identify behaviors to help control weight 3 - Verbalizes/Demonstrates Competency   Explain how alcohol affects blood sugar 1 - Needs Instruction   Use food labels to choose food 2 - Needs Review/Assistance   Discuss benefits/cons of fiber 2 - Needs Review/Assistance   Plan eating/behavior changes working toward personal goals 2 - Needs Review/Assistance   Identify behaviors to help reduce risk of heart disease 2 - Needs Review/Assistance

## 2022-07-31 ENCOUNTER — Encounter (HOSPITAL_COMMUNITY): Payer: Self-pay | Admitting: Gastroenterology

## 2022-08-01 ENCOUNTER — Ambulatory Visit: Payer: 59 | Attending: Family Medicine

## 2022-08-01 ENCOUNTER — Other Ambulatory Visit: Payer: Self-pay

## 2022-08-01 DIAGNOSIS — B20 Human immunodeficiency virus [HIV] disease: Secondary | ICD-10-CM | POA: Insufficient documentation

## 2022-08-01 LAB — COMPREHENSIVE METABOLIC PANEL, NON-FASTING
ALBUMIN: 4.4 g/dL (ref 3.5–5.0)
ALKALINE PHOSPHATASE: 104 U/L (ref 38–126)
ALT (SGPT): 67 U/L — ABNORMAL HIGH (ref <=50)
ANION GAP: 3 mmol/L
AST (SGOT): 34 U/L (ref 17–59)
BILIRUBIN TOTAL: 0.5 mg/dL (ref 0.2–1.3)
BUN/CREA RATIO: 19
BUN: 19 mg/dL (ref 9–20)
CALCIUM: 9.8 mg/dL (ref 8.4–10.2)
CHLORIDE: 104 mmol/L (ref 98–107)
CO2 TOTAL: 30 mmol/L (ref 22–30)
CREATININE: 1.01 mg/dL (ref 0.66–1.25)
ESTIMATED GFR: 77 mL/min/{1.73_m2} (ref >=60)
GLUCOSE: 135 mg/dL — ABNORMAL HIGH (ref 74–106)
POTASSIUM: 4 mmol/L (ref 3.5–5.1)
PROTEIN TOTAL: 7.2 g/dL (ref 6.3–8.2)
SODIUM: 137 mmol/L (ref 137–145)

## 2022-08-01 LAB — MANUAL DIFF AND MORPHOLOGY-SYSMEX
BASOPHIL #: <0.1 10*3/uL (ref <=0.20)
BASOPHIL %: 0 %
EOSINOPHIL #: 0.31 10*3/uL (ref <=0.50)
EOSINOPHIL %: 2 %
LYMPHOCYTE #: 6.91 10*3/uL — ABNORMAL HIGH (ref 1.00–4.80)
LYMPHOCYTE %: 37 %
MONOCYTE #: 1.26 10*3/uL — ABNORMAL HIGH (ref 0.20–1.10)
MONOCYTE %: 8 %
NEUTROPHIL #: 7.22 10*3/uL (ref 1.50–7.70)
NEUTROPHIL %: 46 %
REACTIVE LYMPHOCYTE %: 7 %

## 2022-08-01 LAB — CBC WITH DIFF
HCT: 47.2 % (ref 38.9–52.0)
HGB: 15.3 g/dL (ref 13.4–17.5)
MCH: 32.8 pg — ABNORMAL HIGH (ref 26.0–32.0)
MCHC: 32.4 g/dL (ref 31.0–35.5)
MCV: 101.1 fL — ABNORMAL HIGH (ref 78.0–100.0)
MPV: 10.7 fL (ref 8.7–12.5)
PLATELETS: 309 10*3/uL (ref 150–400)
RBC: 4.67 10*6/uL (ref 4.50–6.10)
RDW-CV: 16.9 % — ABNORMAL HIGH (ref 11.5–15.5)
WBC: 15.7 10*3/uL — ABNORMAL HIGH (ref 3.7–11.0)

## 2022-08-01 NOTE — Ancillary Notes (Signed)
[place]    Venipuncture performed in office on left arm antecubital vein, dry pressure dressing was applied to site and patient tolerated it well.  Specimen was centrifuged, aliquoted as needed and specimen was labeled and packaged for transport.    Jenene Slicker, PHLEBOTOMIST  08/01/2022, 12:18

## 2022-08-02 LAB — CD4/CD8
CD3 # (T CELL): 4068 cells/uL — ABNORMAL HIGH (ref 892–2436)
CD3 % (T CELL): 70 %
CD4 # (HELPER T CELL): 1212 cells/uL (ref 382–1614)
CD4 % (HELPER T CELL): 21 %
CD4:CD8 RATIO: 0.4 — ABNORMAL LOW (ref 1.0–3.4)
CD8 # (SUPPRESSOR T CELL): 2899 cells/uL — ABNORMAL HIGH (ref 157–813)
CD8 % (SUPPRESSOR T CELL): 50 %

## 2022-08-05 LAB — HIV-1 RNA QUANTITATIVE PCR, PLASMA: HIV1 RNA VIRAL LOAD: NOT DETECTED

## 2022-08-09 ENCOUNTER — Encounter (INDEPENDENT_AMBULATORY_CARE_PROVIDER_SITE_OTHER): Payer: 59 | Admitting: PHYSICIAN ASSISTANT

## 2022-08-09 ENCOUNTER — Other Ambulatory Visit (HOSPITAL_BASED_OUTPATIENT_CLINIC_OR_DEPARTMENT_OTHER): Payer: Self-pay | Admitting: HEMATOLOGY/ONCOLOGY

## 2022-08-09 DIAGNOSIS — D509 Iron deficiency anemia, unspecified: Secondary | ICD-10-CM

## 2022-08-09 MED ORDER — FERROUS SULFATE 325 MG (65 MG IRON) TABLET
325.0000 mg | ORAL_TABLET | Freq: Every day | ORAL | 3 refills | Status: DC
Start: 2022-08-09 — End: 2023-08-22

## 2022-08-10 ENCOUNTER — Ambulatory Visit: Payer: 59 | Attending: HEMATOLOGY/ONCOLOGY | Admitting: SURGICAL ONCOLOGY

## 2022-08-10 ENCOUNTER — Other Ambulatory Visit: Payer: Self-pay

## 2022-08-10 ENCOUNTER — Encounter (INDEPENDENT_AMBULATORY_CARE_PROVIDER_SITE_OTHER): Payer: Self-pay | Admitting: SURGICAL ONCOLOGY

## 2022-08-10 VITALS — BP 127/81 | HR 89 | Temp 97.2°F | Wt 160.9 lb

## 2022-08-10 DIAGNOSIS — T8189XA Other complications of procedures, not elsewhere classified, initial encounter: Secondary | ICD-10-CM

## 2022-08-10 DIAGNOSIS — Z9049 Acquired absence of other specified parts of digestive tract: Secondary | ICD-10-CM | POA: Insufficient documentation

## 2022-08-10 DIAGNOSIS — K862 Cyst of pancreas: Secondary | ICD-10-CM

## 2022-08-10 DIAGNOSIS — Z9081 Acquired absence of spleen: Secondary | ICD-10-CM | POA: Insufficient documentation

## 2022-08-10 DIAGNOSIS — K469 Unspecified abdominal hernia without obstruction or gangrene: Secondary | ICD-10-CM | POA: Insufficient documentation

## 2022-08-10 DIAGNOSIS — Z09 Encounter for follow-up examination after completed treatment for conditions other than malignant neoplasm: Secondary | ICD-10-CM

## 2022-08-10 DIAGNOSIS — Z90411 Acquired partial absence of pancreas: Secondary | ICD-10-CM

## 2022-08-10 DIAGNOSIS — Z4802 Encounter for removal of sutures: Secondary | ICD-10-CM | POA: Insufficient documentation

## 2022-08-10 DIAGNOSIS — Z86018 Personal history of other benign neoplasm: Secondary | ICD-10-CM

## 2022-08-10 NOTE — Progress Notes (Signed)
[facility]   RETURN PATIENT EVALUATION    Patient: [name]  D.O.B.: 12-02-1945  MRN# [id number]  Date of Service: 08/10/2022    PCP: [facility]    Subjective:  [name] is a 77 y.o. 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   The patient noted that the 3 small pancreatic masses in the distal tail was first discovered incidentally post op of his colectomy back in 2006. The patient noted years later (2017) for the treatment of his prostate cancer that his pancreatic mass showed that mass grew into a single 7 cm mass that was found incidently CTAP.  Therefore, he underwent the above procedure.     He presented to his local ED on 05/04/22 due to BLE weakness and unsteady date after starting insulin. He was found to have hypercalcemia. His CT AP on 05/04/22 revealed an increased in size in the cystic lesion at the [facility] measuring up to 3.5cm. He was last seen in clinic on 06/08/22 for further evaluation of this with a new MRI/MRCP. His cyst had been present in the past, but it had enlarged. Since he had multiple prior abdominal surgeries with significant complications in the past, surveillance was decided on.    Today, he presents due to two raised, painful areas to his abdomen. One is located to the midline incision and one is located to the left mid quadrant incision. He states that these have been present for a few months and he believes that they are related to prior sutures. He does not difficulty healing his incisional wound in 2006 and eventually had to be taken back to the OR where several sutures we removed prior to it healing.  No fevers,chill, n/v, erythema, or drainage to the sites.     All other systems reviewed are negative, except those listed above. The patient denies any changes to his medical, surgical, family or social history since his visit on 06/08/22.    Objective:  Vitals are BP 127/81   Pulse 89   Temp 36.2  C (97.2 F)   Wt 73 kg (160 lb 15 oz)   SpO2 96%   BMI 23.09 kg/m     On examination the patient is pleasant, in NAD. Trachea is midline. Heart is RRR without murmurs. Respirations are unlabored and lungs are CTA. No peripheral edema or cyanosis. Abdomen is soft, non-tender and non-distended. There is a small, firm area likely from a suture to the proximal portion of his midline and left mid quadrant. No edema, erythema, or drainage. Normal muscle strength and tone of all extremities. Skin has no rashes or jaundice. He is alert and oriented without focal motor or sensory deficits. Speech pattern and mood/affect normal.     Assessment and Plan:  [name] is a 77 y.o. 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  He has a hx of multiple abdominal surgeries in the past with a hx of suture reaction. Today, he presents with two small areas that are firm and painful. We were able to remove both sutures today at the bedside. He will monitor for any s/s of infection or other issues.  Otherwise, we plan to see him back with repeat imaging for his HOP cyst on 12/07/22. Patient was given the opportunity to ask questions, agreed with the treatment plan, and was encouraged to call with any additional questions or concerns.  I independently of the faculty provider spent a total of (10) minutes in direct/indirect care of this patient including initial evaluation, review of laboratory, radiology, diagnostic studies, review of medical record, order entry and coordination of care.  Alayjah Boehringer Stimac, APRN,NP-C, RNFA  The patient was seen with Dr. Lissa Hoard    I personally saw and examined the patient. See Nurse Practitioner's note for additional details.     Presenting with sutures poking his skin and causing discomfort.  I reviewed his imaging which shows abdominal hernia defect, therefore I carefully removed these sutures, staying superficial.  I numbed the skin by injecting  1% lidocaine after cleaning the two areas with chlorhexidine.  A small incision was made overlying the sutures with an 11 blade.  The sutures were then grasped and removed entirely.  5-0 monocryl was used to close the skin followed by skin glue.  He tolerated the procedure well.     Otherwise he is having back problems and is planning to see orthopedics.  His wife is recovering well from knee replacement. He is looking forward to upcoming visit with his granddaughter.     [name], MD, FACS

## 2022-08-12 ENCOUNTER — Other Ambulatory Visit: Payer: Self-pay

## 2022-08-12 ENCOUNTER — Ambulatory Visit (INDEPENDENT_AMBULATORY_CARE_PROVIDER_SITE_OTHER): Payer: 59 | Admitting: PHYSICIAN ASSISTANT

## 2022-08-12 ENCOUNTER — Encounter (INDEPENDENT_AMBULATORY_CARE_PROVIDER_SITE_OTHER): Payer: Self-pay | Admitting: PHYSICIAN ASSISTANT

## 2022-08-12 VITALS — BP 130/83 | HR 85 | Resp 18 | Ht 70.0 in | Wt 160.0 lb

## 2022-08-12 DIAGNOSIS — A609 Anogenital herpesviral infection, unspecified: Secondary | ICD-10-CM

## 2022-08-12 DIAGNOSIS — B20 Human immunodeficiency virus [HIV] disease: Secondary | ICD-10-CM

## 2022-08-12 DIAGNOSIS — B009 Herpesviral infection, unspecified: Secondary | ICD-10-CM | POA: Insufficient documentation

## 2022-08-12 DIAGNOSIS — D72829 Elevated white blood cell count, unspecified: Secondary | ICD-10-CM | POA: Insufficient documentation

## 2022-08-12 DIAGNOSIS — L089 Local infection of the skin and subcutaneous tissue, unspecified: Secondary | ICD-10-CM

## 2022-08-12 MED ORDER — GENVOYA 150 MG-150 MG-200 MG-10 MG TABLET
1.00 | ORAL_TABLET | Freq: Every day | ORAL | 1 refills | Status: AC
Start: 2022-08-12 — End: ?

## 2022-08-12 NOTE — Progress Notes (Signed)
Return Patient Visit         CHIEF COMPLAINT: HIV Follow-up       HPI: HIV infection History of MRSA bacteremia secondary skin and soft tissue infection. Patient is on Genvoya, stable HIV VL and CD4 count. 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 He gets back and hip injections. 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 Reports following with a specialist through the [place] in [place] for pain management, Dr. Hettie Holstein.     REVIEW OF SYSTEMS:  Negative except as noted in HPI    MEDICATIONS:  Current Outpatient Medications   Medication Sig    acetaminophen (TYLENOL) 325 mg Oral Tablet Take 2 Tablets (650 mg total) by mouth Every 6 hours as needed for Pain    amLODIPine (NORVASC) 10 mg Oral Tablet Take 1 Tablet (10 mg total) by mouth Once a day    atorvastatin (LIPITOR) 80 mg Oral Tablet Take 0.5 Tablets (40 mg total) by mouth Every morning with breakfast    Blood-Glucose Meter,Continuous (FREESTYLE LIBRE 3 READER) Does not apply Misc 1 Kit continuous    Blood-Glucose Sensor (FREESTYLE LIBRE 3 SENSOR) Does not apply Device 1 Each Every 14 days    cyanocobalamin (VITAMIN B 12) 1,000 mcg Oral Tablet Take 1 Tablet (1,000 mcg total) by mouth Every morning    docusate sodium (COLACE) 100 mg Oral Capsule Take 1 Capsule (100 mg total) by mouth    elviteg-cob-emtri-tenof ALAFEN (GENVOYA) 150-150-200-10 mg Oral Tablet Take 1 Tablet by mouth Once a day    ferrous sulfate (FEOSOL) 325 mg (65 mg iron) Oral Tablet Take 1 Tablet (325 mg total) by mouth Once a day    gabapentin (NEURONTIN) 100 mg Oral Capsule Take 3 Capsules (300 mg total) by mouth Three times a day For shingle pain    glipiZIDE (GLUCOTROL) 5 mg Oral Tablet Take 1 tablet twice daily    insulin glargine (LANTUS SOLOSTAR U-100 INSULIN) 100 unit/mL Subcutaneous Insulin Pen Inject 8 Units under the skin Every night    levothyroxine (SYNTHROID) 88 mcg Oral Tablet Take 75 mcg by mouth Every morning    melatonin 3  mg Oral Tablet Take 2 Tablets (6 mg total) by mouth Every night    Mirtazapine (REMERON) 7.5 mg Oral Tablet Take 1 Tablet (7.5 mg total) by mouth Every night    MULTIVITAMIN ORAL Take 1 Tablet by mouth Once a day    nystatin (MYCOSTATIN) 100,000 unit/mL Oral Suspension SWISH BY MOUTH FOR 2 MINUTES AND THEN SPIT. DO NOT RINSE. REPEAT 4-5 TIMES A DAY    pantoprazole (PROTONIX) 40 mg Oral Tablet, Delayed Release (E.C.) Take 1 Tablet (40 mg total) by mouth Once a day    Pen Needle, Disposable, 32 gauge x 5/32" Needle Use one pen needle with daily Lantus injection.    sennosides-docusate sodium (SENOKOT-S) 8.6-50 mg Oral Tablet Take 1 Tablet by mouth Twice daily       ANTIRETROVIRAL REGIMEN:  Genvoya    PHYSICAL EXAM:  General: In no acute distress.  Does not look acutely or chronically ill  Eyes: No scleral icterus or conjunctival injection  Mental status: Alert and oriented x 3.Cooperative with normal cognition.  Cardiovascular: No cyanosis. No edema of LE  Respiratory: Normal respiratory effort. No accessory muscle use.   Skin: b/l gluteal area pimple like lesion     VITALS:  Vitals:    08/12/22 0951   BP: 130/83   Pulse: 85   Resp:  18   SpO2: 97%   Weight: 72.6 kg (160 lb)   Height: 1.778 m (5\' 10" )   BMI: 23.01        CD4 # (HELPER T CELL) (cells/uL)   Date Value   08/01/2022 1,212   02/02/2022 1,130     CD4 % (HELPER T CELL) (%)   Date Value   08/01/2022 21   02/02/2022 20     HIV1 RNA VIRAL LOAD (no units)   Date Value   08/01/2022 Target Not Detected   02/02/2022 Target Not Detected       Immune Status Pos/Neg/Date   HEPATITIS C ANTIBODY HCV ANTIBODY QUALITATIVE   Date Value Ref Range Status   03/30/2021 Negative Negative Final     Comment:     Hepatitis Serology Methods = Chemiluminescent immunoassay performed on [employer] equipment          SCREENINGS RESULT   LIPID PANEL TRIGLYCERIDES   Date Value Ref Range Status   02/02/2022 318 (H) 0 - 149 mg/dL Final     CHOLESTEROL   Date Value Ref Range Status    02/02/2022 177 0 - 199 mg/dL Final     HDL CHOL   Date Value Ref Range Status   02/02/2022 36 (L) 40 - 60 mg/dL Final     LDL CALC   Date Value Ref Range Status   02/02/2022 77 0 - 130 mg/dL Final       RPR (ANNUAL) 02/02/22 Negative   QUANTIFERON (ANNUAL) 02/02/22 Negative       Health Maintenance Due   Topic Date Due    Diabetic Retinal Exam  Never done    Osteoporosis screening  Never done    Adult Tdap-Td (1 - Tdap) Never done    Medicare Annual Wellness Visit  Never done    Shingles Vaccine (2 of 2) 04/24/2018    Meningococcal B Vaccine (2 of 4 - Increased Risk Bexsero 2-dose series) 04/01/2021    Meningococcal Vaccine (2 - Risk 2-dose series) 04/29/2021    Covid-19 Vaccine (3 - Moderna risk series) 12/07/2021    Pneumococcal Vaccination, Age 60+ (2 of 2 - PPSV23 or PCV20) 03/04/2022    Diabetic Kidney Health Microalb/Cr Ratio  09/07/2022        Immunization History   Administered Date(s) Administered    Covid-19 Vaccine,Moderna Bivalent 01/07/2021    Covid-19 Vaccine,Moderna(Spikevax),23mcg/0.5mL(2023-2024),12 yr+ 11/09/2021    HAEMOPHILUS B CONJUGATE VACCINE 03/04/2021    HEPATITIS B VIRUS RECOMB VACCINE 10/11/2013, 11/18/2013, 02/18/2014    High-Dose Influenza Vaccine, 65+ 11/06/2018, 10/29/2021    Influenza Vaccine, 6 month-adult 11/18/2013, 10/27/2014, 10/16/2015, 10/17/2016, 11/03/2017    Meningococcal B Vaccine - 2 Dose 03/04/2021    Meningococcal Vaccine 03/04/2021    PREVNAR 13 12/14/2012, 01/14/2014    Shingrix - Zoster Vaccine 02/27/2018    VAXNEUVANCE 03/04/2021       HIV:      ANTIRETROVIRAL REGIMEN:  Genvoya     ADHERENCE:     stable, continue RX,    ADHERENCE COUNSELING:  YES    Antimicrobial prophylaxis:  N/A    Sexual Activity: Monogamous, wife.         ASSESSMENT/PLAN    ICD-10-CM    1. Human immunodeficiency virus (HIV) disease (CMS HCC)  B20 elviteg-cob-emtri-tenof ALAFEN (GENVOYA) 150-150-200-10 mg Oral Tablet     CBC/DIFF     COMPREHENSIVE METABOLIC PANEL, NON-FASTING     HIV-1 RNA  QUANTITATIVE PCR, PLASMA     CD4/CD8  QUANTIFERON TB GOLD PLUS, BLOOD     SYPHILIS, RAPID PLASMIN REAGIN (RPR), WITH TITER REFLEX     LIPID PANEL      2. Infection of skin due to methicillin resistant Staphylococcus aureus (MRSA)  L08.9     B95.62       3. HSV (herpes simplex virus) anogenital infection  A60.9       4. Leukocytosis, unspecified type  D72.829       HIV  History of MRSA bacteremia secondary skin and soft tissue infection.   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 Denies recent outbreaks in genitals.     Patient stable on Genvoya. Tolerating well. Denies missed doses.   Vaccines are up to date  Follows with [facility] for primary care  Patient with occasional HSV outbreaks treated with Valtrex as needed  CD4 count within normal range, HIV VL not detected. Kidney function normal  Patient seeing a pain specialist at the [facility], patient requesting letter to the specialist about chronic leukocytosis following splenectomy as well as HIV status.   Repeat labs in 6 months           PATIENT INSTRUCTIONS:  There are no Patient Instructions on file for this visit.    FOLLOW UP  Return in about 6 months (around 02/12/2023) for follow-up HIV.    Odette Fraction, PA

## 2022-08-12 NOTE — Nursing Note (Signed)
Follow up visit for HIV, HSV. Patient is currently taking Genvoya. He has had a couple rounds of Valtrex for HSV outbreaks since his last office visit.   Seeing Dr. Camelia Phenes from [facility] for left SI joint pain.

## 2022-08-22 ENCOUNTER — Ambulatory Visit: Payer: 59 | Attending: Nurse Practitioner | Admitting: Nurse Practitioner

## 2022-08-22 ENCOUNTER — Other Ambulatory Visit: Payer: Self-pay

## 2022-08-22 ENCOUNTER — Encounter (HOSPITAL_BASED_OUTPATIENT_CLINIC_OR_DEPARTMENT_OTHER): Payer: Self-pay | Admitting: Nurse Practitioner

## 2022-08-22 VITALS — BP 122/80 | Ht 70.0 in | Wt 157.2 lb

## 2022-08-22 DIAGNOSIS — Z86010 Personal history of colonic polyps: Secondary | ICD-10-CM | POA: Insufficient documentation

## 2022-08-22 DIAGNOSIS — K219 Gastro-esophageal reflux disease without esophagitis: Secondary | ICD-10-CM | POA: Insufficient documentation

## 2022-08-22 DIAGNOSIS — D509 Iron deficiency anemia, unspecified: Secondary | ICD-10-CM | POA: Insufficient documentation

## 2022-08-22 MED ORDER — PEG 3350-ELECTROLYTES 236 GRAM-22.74 GRAM-6.74 GRAM-5.86 GRAM SOLUTION
4.0000 L | Freq: Once | ORAL | 0 refills | Status: AC
Start: 2022-08-22 — End: 2022-08-22

## 2022-08-22 NOTE — H&P (Signed)
GASTROENTEROLOGY, MEDICAL OFFICE BUILDING B  [address]  [name] [address]  [phone]    History and Physical  Name: [name]  MRN: [id number]  DOB: [birth year]  Age: 77 y.o.  Date: 08/22/2022  Referring provider: Gwendel Hanson, MD    Reason for Visit: Anemia    History of Present Illness:  [name] is a 77 y.o. male who presents today for evaluation of IDA, GERD, history of colon polyps.    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 Sent home on the Eliquis, but then woke up with bleeding around his drain from the pancreatectomy. Had to receive 8 pints of blood after that.     Though was having chronic anemia, felt it was due to Eliquis and stopped this in 01/2022, since that time. Thought he had iron infusions last August, but actually was a reclast infusion. Has not had iron infusions to his knowledge.     Last colonoscopy in [place] at the [facility] about 9 years ago. States he was told to follow up in 10 years, though states he has had colon polyps in the past. History of diverticulitis with rupture in the past as well, in 2006. Had colostomy for 2 years and reversed in 2008.     Bowel movements are daily with taking Miralax and 1 stool softener daily. He states he had constipation back in May and was admitted.Bowel movements 1-2/day, stools are formed. Feels like a full evacuation. Denies hematochezia or melena.     Taking iron supplement once daily.     Denies frequent heartburn  or reflux. Taking Protonix 40 mg once daily. Last EGD was in 05/2021 by Dr. Antony Blackbird.     Continues to follow with Dr. Lissa Hoard in [facility] for pancreatic cysts/lesions and history of pancreatectomy    Denies fevers, chills, nausea, vomiting, heartburn, reflux, melena, hematochezia, or unintentional weight loss.      Patient History:  Past Medical History:   Diagnosis Date    Cancer (CMS [facility])     prostate    Deep vein thrombosis (DVT) (CMS HCC)     right leg    Esophageal reflux     H/O hearing loss     High cholesterol     History of kidney disease     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    HTN (hypertension)     Human immunodeficiency virus (HIV) disease (CMS HCC)     Hyperlipidemia     "borderline"    Hypothyroidism     MRSA (methicillin resistant staph aureus) culture positive 01/02/2021    MRSA left groin abscess 01/03/21    MRSA (methicillin resistant staph aureus) culture positive 01/02/2021    MRSA blood 01/02/21    Pulmonary embolism (CMS HCC) 03/31/2021    Thyroid disorder     Wears glasses      Past Surgical History:   Procedure Laterality Date    COLON SURGERY      per patient had part of colon removed and  had a colostomy    COLONOSCOPY      GASTROSCOPY  06/24/2021    Dr.Thakur    HIP SURGERY Right 04/22/2020    Right hip arthrotomy 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 Aaron Mose CERVICAL SPINE SURGERY  2001    HX COLOSTOMY REVERSAL      HX HERNIA REPAIR      HX LAPAROTOMY      KNEE SURGERY Bilateral     PANCREATECTOMY      SPLENECTOMY      WRIST SURGERY Right          Current Outpatient Medications   Medication Sig    acetaminophen (TYLENOL) 325 mg Oral Tablet Take 2 Tablets (650 mg total) by mouth Every 6 hours as needed for Pain    amLODIPine (NORVASC) 10 mg Oral Tablet Take 1 Tablet (10 mg total) by mouth Once a day    atorvastatin (LIPITOR) 10 mg Oral Tablet Take 1 Tablet (10 mg total) by mouth Every evening    Blood-Glucose Meter,Continuous (FREESTYLE LIBRE 3 READER) Does  not apply Misc 1 Kit continuous    Blood-Glucose Sensor (FREESTYLE LIBRE 3 SENSOR) Does not apply Device 1 Each Every 14 days    cyanocobalamin (VITAMIN B 12) 1,000 mcg Oral Tablet Take 1 Tablet (1,000 mcg total) by mouth Every morning    docusate sodium (COLACE) 100 mg Oral Capsule Take 1 Capsule (100 mg total) by mouth    elviteg-cob-emtri-tenof ALAFEN (GENVOYA) 150-150-200-10 mg Oral Tablet Take 1 Tablet by mouth Once a day    ferrous sulfate (FEOSOL) 325 mg (65 mg iron) Oral Tablet Take 1 Tablet (325 mg total) by mouth Once a day    gabapentin (NEURONTIN) 100 mg Oral Capsule Take 3 Capsules (300 mg total) by mouth Three times a day For shingle pain    glipiZIDE (GLUCOTROL) 5 mg Oral Tablet Take 1 tablet twice daily    insulin glargine (LANTUS SOLOSTAR U-100 INSULIN) 100 unit/mL Subcutaneous Insulin Pen Inject 8 Units under the skin Every night    levothyroxine (SYNTHROID) 88 mcg Oral Tablet Take 75 mcg by mouth Every morning    melatonin 3 mg Oral Tablet Take 2 Tablets (6 mg total) by mouth Every night    Mirtazapine (REMERON) 7.5 mg Oral Tablet Take 1 Tablet (7.5 mg total) by mouth Every night (Patient not taking: Reported on 08/22/2022)    MULTIVITAMIN ORAL Take 1 Tablet by mouth Once a day    nystatin (MYCOSTATIN) 100,000 unit/mL Oral Suspension SWISH BY MOUTH FOR 2 MINUTES AND THEN SPIT. DO NOT RINSE. REPEAT 4-5 TIMES A DAY    pantoprazole (PROTONIX) 40 mg Oral Tablet, Delayed Release (E.C.) Take 1 Tablet (40 mg total) by mouth Once a day    Pen Needle, Disposable, 32 gauge x 5/32" Needle Use one pen needle with daily Lantus injection.    sennosides-docusate sodium (SENOKOT-S) 8.6-50 mg Oral Tablet Take 1 Tablet by mouth Twice daily     No Known Allergies  Family Medical History:       Problem Relation (Age of Onset)    Cancer Mother, Father, Other    Heart Attack Mother    High Cholesterol Other    Stroke Other            Social History     Socioeconomic History    Marital status: Married     Spouse  name: Not on file    Number of children: Not on  file    Years of education: Not on file    Highest education level: Not on file   Occupational History    Not on file   Tobacco Use    Smoking status: Never    Smokeless tobacco: Never   Vaping Use    Vaping status: Never Used   Substance and Sexual Activity    Alcohol use: Not Currently    Drug use: Never    Sexual activity: Not on file   Other Topics Concern    Calcium intake adequate Not Asked    Computer Use Not Asked    Drives Not Asked    Exercise Concern Not Asked    Helmet Use Not Asked    Seat Belt Not Asked    Uses Cane Not Asked    Uses walker Not Asked    Uses wheelchair Not Asked    Right hand dominant Not Asked    Left hand dominant Not Asked    Ambidextrous Not Asked    Uses Scooter Not Asked    Activity Limitations Not Asked    Ability to Walk 1 Flight of Steps without SOB/CP Yes    Routine Exercise Yes    Ability to Walk 2 Flight of Steps without SOB/CP Yes    Unable to Ambulate No    Total Care No    Ability To Do Own ADL's Yes    Uses Walker No    Other Activity Level Yes    Uses Cane No   Social History Narrative    Not on file     Social Determinants of Health     Financial Resource Strain: Low Risk  (05/05/2022)    Financial Resource Strain     SDOH Financial: No   Transportation Needs: Low Risk  (05/05/2022)    Transportation Needs     SDOH Transportation: No   Social Connections: Low Risk  (05/05/2022)    Social Connections     SDOH Social Isolation: 5 or more times a week   Intimate Partner Violence: Low Risk  (12/31/2021)    Intimate Partner Violence     SDOH Domestic Violence: No   Housing Stability: Low Risk  (05/05/2022)    Housing Stability     SDOH Housing Situation: I have housing.     SDOH Housing Worry: No     Review of Systems:     General Negative for: Appetite Loss, Weight loss, Fatigue out of ordinary     ENT Negative for: Nose bleeds, Mouth sores, Sore throat     Respiratory Negative for: Coughing up blood, Wheezing, Nocturnal  cough  Cardiovascular Positive for: Indigestion  Cardiovascular Negative for: Chest Pain, Leg edema     Gastronintestinal Negative for: Vomiting, Nausea, Heartburn, Difficulty swallowing, Change in bowl habits, Black/Tarry stools, Abdominal pain     Genitourinary Negative for: Pelvic Pain, Urinary frequency  All other review of systems negative.     Physical Exam:  BP 122/80   Ht 1.778 m (5\' 10" )   Wt 71.3 kg (157 lb 3 oz)   BMI 22.55 kg/m       Physical Exam  Vitals reviewed.   Constitutional:       General: He is not in acute distress.     Appearance: Normal appearance. He is not ill-appearing.   HENT:      Head: Normocephalic and atraumatic.      Nose: Nose normal.   Eyes:      Extraocular  Movements: Extraocular movements intact.      Conjunctiva/sclera: Conjunctivae normal.      Pupils: Pupils are equal, round, and reactive to light.   Pulmonary:      Effort: Pulmonary effort is normal.   Musculoskeletal:         General: No swelling, deformity or signs of injury. Normal range of motion.      Cervical back: Normal range of motion.   Skin:     General: Skin is warm and dry.      Findings: No erythema.   Neurological:      General: No focal deficit present.      Mental Status: He is alert and oriented to person, place, and time. Mental status is at baseline.   Psychiatric:         Mood and Affect: Mood normal.         Behavior: Behavior normal.         Thought Content: Thought content normal.       Assessment and Plan:    ICD-10-CM    1. Iron deficiency anemia  D50.9 CBC/DIFF     THYROID STIMULATING HORMONE (SENSITIVE TSH)     TISSUE TRANSGLUTAMINASE (TTG) ANTIBODY, IGA, SERUM     VITAMIN B12 WITH FOLIC ACID     IRON     FERRITIN     IRON TRANSFERRIN AND TIBC     CASE REQUEST SURGICAL: COLONOSCOPY, GASTROSCOPY      2. History of colon polyps  Z86.010 CASE REQUEST SURGICAL: COLONOSCOPY, GASTROSCOPY      3. Gastroesophageal reflux disease, unspecified whether esophagitis present  K21.9 CASE REQUEST SURGICAL:  COLONOSCOPY, GASTROSCOPY        Will schedule EGD and colonoscopy to evaluate history of IDA, history of colon polyps, GERD. Golytely prep. Discussed risks of procedure which include but are not limited to possible risk of bleeding, infection, perforation, anesthesia complications, and reviewed prep instructions, patient voiced understanding and wishes to proceed. Update labs per orders to trend. Continue Miralax and colace to help keep bowels regular. Continue Protonix 40 mg once daily for GERD. Continue iron supplement daily. Follow-up post-procedure, sooner if needed.     Tama Headings, FNP-C  08/22/2022, 07:27    This note may have been partially generated using MModal Fluency Direct system, and there may be some incorrect words, spellings, and punctuation that were not noted in checking the note before saving, though effort was made to avoid such errors.

## 2022-08-26 ENCOUNTER — Encounter (INDEPENDENT_AMBULATORY_CARE_PROVIDER_SITE_OTHER): Payer: Self-pay

## 2022-08-26 ENCOUNTER — Other Ambulatory Visit: Payer: Self-pay

## 2022-08-26 ENCOUNTER — Encounter (INDEPENDENT_AMBULATORY_CARE_PROVIDER_SITE_OTHER): Payer: Self-pay | Admitting: Internal Medicine

## 2022-08-26 ENCOUNTER — Ambulatory Visit (INDEPENDENT_AMBULATORY_CARE_PROVIDER_SITE_OTHER): Payer: 59

## 2022-08-26 ENCOUNTER — Ambulatory Visit: Payer: 59 | Attending: Internal Medicine | Admitting: Internal Medicine

## 2022-08-26 ENCOUNTER — Telehealth (INDEPENDENT_AMBULATORY_CARE_PROVIDER_SITE_OTHER): Payer: Self-pay | Admitting: Internal Medicine

## 2022-08-26 DIAGNOSIS — D509 Iron deficiency anemia, unspecified: Secondary | ICD-10-CM

## 2022-08-26 DIAGNOSIS — Z713 Dietary counseling and surveillance: Secondary | ICD-10-CM | POA: Insufficient documentation

## 2022-08-26 DIAGNOSIS — E1165 Type 2 diabetes mellitus with hyperglycemia: Secondary | ICD-10-CM | POA: Insufficient documentation

## 2022-08-26 DIAGNOSIS — Z978 Presence of other specified devices: Secondary | ICD-10-CM | POA: Insufficient documentation

## 2022-08-26 DIAGNOSIS — M810 Age-related osteoporosis without current pathological fracture: Secondary | ICD-10-CM

## 2022-08-26 DIAGNOSIS — Z794 Long term (current) use of insulin: Secondary | ICD-10-CM

## 2022-08-26 LAB — CBC WITH DIFF
BASOPHIL #: <0.1 10*3/uL (ref <=0.20)
BASOPHIL %: 0.4 %
EOSINOPHIL #: 0.15 10*3/uL (ref <=0.50)
EOSINOPHIL %: 1.3 %
HCT: 46.8 % (ref 38.9–52.0)
HGB: 14.5 g/dL (ref 13.4–17.5)
IMMATURE GRANULOCYTE #: <0.1 10*3/uL (ref <0.10)
IMMATURE GRANULOCYTE %: 0.3 % (ref 0.0–1.0)
LYMPHOCYTE #: 5.04 10*3/uL — ABNORMAL HIGH (ref 1.00–4.80)
LYMPHOCYTE %: 42.2 %
MCH: 31 pg (ref 26.0–32.0)
MCHC: 31 g/dL (ref 31.0–35.5)
MCV: 100 fL (ref 78.0–100.0)
MONOCYTE #: 1.17 10*3/uL — ABNORMAL HIGH (ref 0.20–1.10)
MONOCYTE %: 9.8 %
MPV: 11.2 fL (ref 8.7–12.5)
NEUTROPHIL #: 5.48 10*3/uL (ref 1.50–7.70)
NEUTROPHIL %: 46 %
PLATELETS: 294 10*3/uL (ref 150–400)
RBC: 4.68 10*6/uL (ref 4.50–6.10)
RDW-CV: 15.6 % — ABNORMAL HIGH (ref 11.5–15.5)
WBC: 11.9 10*3/uL — ABNORMAL HIGH (ref 3.7–11.0)

## 2022-08-26 LAB — POC HEMOGLOBIN A1C (RESULTS): HEMOGLOBIN A1C, POC: 8 % — ABNORMAL HIGH

## 2022-08-26 LAB — IRON TRANSFERRIN AND TIBC
IRON (TRANSFERRIN) SATURATION: 12 % — ABNORMAL LOW (ref 20–50)
IRON: 52 ug/dL — ABNORMAL LOW (ref 55–175)
TOTAL IRON BINDING CAPACITY: 421 ug/dL (ref 224–476)
TRANSFERRIN: 301 mg/dL (ref 160–340)

## 2022-08-26 LAB — CREATININE WITH EGFR
CREATININE: 1.25 mg/dL (ref 0.75–1.35)
ESTIMATED GFR - MALE: 59 mL/min/BSA — ABNORMAL LOW (ref >=60)

## 2022-08-26 LAB — ALBUMIN: ALBUMIN: 3.8 g/dL (ref 3.4–4.8)

## 2022-08-26 LAB — FOLATE: FOLATE: 16.3 ng/mL (ref 7.0–31.0)

## 2022-08-26 LAB — THYROID STIMULATING HORMONE (SENSITIVE TSH): TSH: 1.033 u[IU]/mL (ref 0.350–4.940)

## 2022-08-26 LAB — POC BLOOD GLUCOSE (RESULTS): GLUCOSE, POC: 243 mg/dl (ref 80–130)

## 2022-08-26 LAB — VITAMIN B12: VITAMIN B 12: >2000 pg/mL — ABNORMAL HIGH (ref 200–900)

## 2022-08-26 LAB — CALCIUM: CALCIUM: 10.3 mg/dL (ref 8.6–10.3)

## 2022-08-26 LAB — VITAMIN D 25 TOTAL: VITAMIN D 25, TOTAL: 50.9 ng/mL (ref 20.0–100.0)

## 2022-08-26 LAB — FERRITIN: FERRITIN: 62 ng/mL (ref 20–300)

## 2022-08-26 MED ORDER — GLIPIZIDE 5 MG TABLET
ORAL_TABLET | ORAL | 3 refills | Status: AC
Start: 2022-08-26 — End: ?

## 2022-08-26 MED ORDER — LANTUS SOLOSTAR U-100 INSULIN 100 UNIT/ML (3 ML) SUBCUTANEOUS PEN
5.00 [IU] | PEN_INJECTOR | Freq: Every evening | SUBCUTANEOUS | 0 refills | Status: AC
Start: 2022-08-26 — End: ?

## 2022-08-26 NOTE — Progress Notes (Signed)
[facility]  [address], [place], [place] [address]  Dept: [phone]  Fax: [phone]    ENDOCRINOLOGY OUTPATIENT PROGRESS NOTE    Name: [name]  Age: 77 y.o., Sex: Male  MRN: [id number]    Referred by: [facility]  Reason for Consult: Diabetes Mellitus    HISTORY OF PRESENT ILLNESS:     [name] is a 77 y.o. male  who is here for his visit for diabetes.   Established care for osteoporosis.       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 A1c 9.3%    Following his discharge, patient stopped taking all insulin. A1c and glucose levels improved on own.   Suspecting DM related to type 2 rather than pancreatic insufficiency    A1c 8 %    Current Regimen:   Lantus 8 units once a day  Glipizide 5 mg twice daily  Unable to tolerate metformin      Pertinent Osteoporosis History    S/p iv reclast in aug 2023    Known history of compression fracture since early 2023.  Atraumatic    Had a DEXA scan recently in June of 2023    T-score of lumbar spine -2.8.  T-score of total femoral hip -2  T-score of femoral neck -1.9    No history of steroid use, narcotic use, illicit drug use, nicotine abuse or alcohol use    Positive for prostate cancer.  On surveillance  Positive for HIV.  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    Past Family History  Mother with cancer and heart disease    Past Surgical History  Distal pancreatectomy    Social History   No history of smoking, alcohol use or  illicit drug use      Allergies  No Known Allergies     Medication History     Current Outpatient Medications   Medication Sig    acetaminophen (TYLENOL) 325 mg Oral Tablet Take 2 Tablets (650 mg total) by mouth Every 6 hours as needed for Pain    amLODIPine (NORVASC) 10 mg Oral Tablet Take 1 Tablet (10 mg total) by mouth Once a day    atorvastatin (LIPITOR) 10 mg Oral Tablet Take 1 Tablet (10 mg total) by mouth Every evening    Blood-Glucose Meter,Continuous (FREESTYLE LIBRE 3 READER) Does not apply Misc 1 Kit continuous    Blood-Glucose Sensor (FREESTYLE LIBRE 3 SENSOR) Does not apply Device 1 Each Every 14 days    cyanocobalamin (VITAMIN B 12) 1,000 mcg Oral Tablet Take 1 Tablet (1,000 mcg total) by mouth Every morning    docusate sodium (COLACE) 100 mg Oral Capsule Take 1 Capsule (100 mg total) by mouth    elviteg-cob-emtri-tenof ALAFEN (GENVOYA) 150-150-200-10 mg Oral Tablet Take 1 Tablet by mouth Once a day    ferrous sulfate (FEOSOL) 325 mg (65 mg iron) Oral Tablet Take 1 Tablet (325 mg total) by mouth Once a day  gabapentin (NEURONTIN) 100 mg Oral Capsule Take 3 Capsules (300 mg total) by mouth Three times a day For shingle pain    glipiZIDE (GLUCOTROL) 5 mg Oral Tablet Take 1.5 tablet twice daily    insulin glargine (LANTUS SOLOSTAR U-100 INSULIN) 100 unit/mL Subcutaneous Insulin Pen Inject 5 Units under the skin Every night    levothyroxine (SYNTHROID) 88 mcg Oral Tablet Take 75 mcg by mouth Every morning    melatonin 3 mg Oral Tablet Take 2 Tablets (6 mg total) by mouth Every night    Mirtazapine (REMERON) 7.5 mg Oral Tablet Take 1 Tablet (7.5 mg total) by mouth Every night    MULTIVITAMIN ORAL Take 1 Tablet by mouth Once a day    nystatin (MYCOSTATIN) 100,000 unit/mL Oral Suspension SWISH BY MOUTH FOR 2 MINUTES AND THEN SPIT. DO NOT RINSE. REPEAT 4-5 TIMES A DAY    pantoprazole (PROTONIX) 40 mg Oral Tablet, Delayed Release (E.C.) Take 1 Tablet (40 mg total) by mouth Once a day    Pen Needle,  Disposable, 32 gauge x 5/32" Needle Use one pen needle with daily Lantus injection.    sennosides-docusate sodium (SENOKOT-S) 8.6-50 mg Oral Tablet Take 1 Tablet by mouth Twice daily        OBJECTIVE:     BP 130/80   Pulse 85   Ht 1.778 m (5\' 10" )   Wt 73.4 kg (161 lb 13.1 oz)   BMI 23.22 kg/m        Physical Exam  Constitutional:       General: He is not in acute distress.     Appearance: He is not ill-appearing.   Pulmonary:      Effort: Pulmonary effort is normal. No respiratory distress.   Neurological:      Mental Status: He is alert and oriented to person, place, and time. Mental status is at baseline.   Psychiatric:         Mood and Affect: Mood normal.         Behavior: Behavior normal.         Thought Content: Thought content normal.         Judgment: Judgment normal.       Lab Investigations and Imaging  I have personally reviewed and interpreted all pertinent labs and imaging    BASIC METABOLIC PANEL  Lab Results   Component Value Date    SODIUM 137 08/01/2022    POTASSIUM 4.0 08/01/2022    CHLORIDE 104 08/01/2022    CO2 30 08/01/2022    ANIONGAP 3 08/01/2022    BUN 19 08/01/2022    CREATININE 1.01 08/01/2022    BUNCRRATIO 19 08/01/2022    GFR 77 08/01/2022    CALCIUM 9.8 08/01/2022    GLUCOSENF 135 (H) 08/01/2022        ASSESSMENT & PLAN:       ICD-10-CM    1. Type 2 diabetes mellitus with hyperglycemia, with long-term current use of insulin (CMS HCC)  E11.65 POCT Blood Glucose/Fingerstick (AMB)    Z79.4 POCT HGB A1C     ALBUMIN     CALCIUM     VITAMIN D 25 TOTAL     CREATININE WITH EGFR     glipiZIDE (GLUCOTROL) 5 mg Oral Tablet     insulin glargine (LANTUS SOLOSTAR U-100 INSULIN) 100 unit/mL Subcutaneous Insulin Pen     95251 - INTERPRETATION/REPORT CONTINUOUS GLUCOSE MONITORING VIA SUBCUTANEOUS SENSOR (AMB ONLY)      2. Nutritional counseling  Z71.3  ALBUMIN     CALCIUM     VITAMIN D 25 TOTAL     CREATININE WITH EGFR     glipiZIDE (GLUCOTROL) 5 mg Oral Tablet     insulin glargine (LANTUS SOLOSTAR  U-100 INSULIN) 100 unit/mL Subcutaneous Insulin Pen     95251 - INTERPRETATION/REPORT CONTINUOUS GLUCOSE MONITORING VIA SUBCUTANEOUS SENSOR (AMB ONLY)      3. Uses self-applied continuous glucose monitoring device  Z97.8 ALBUMIN     CALCIUM     VITAMIN D 25 TOTAL     CREATININE WITH EGFR     glipiZIDE (GLUCOTROL) 5 mg Oral Tablet     insulin glargine (LANTUS SOLOSTAR U-100 INSULIN) 100 unit/mL Subcutaneous Insulin Pen     95251 - INTERPRETATION/REPORT CONTINUOUS GLUCOSE MONITORING VIA SUBCUTANEOUS SENSOR (AMB ONLY)      4. Osteoporosis, unspecified osteoporosis type, unspecified pathological fracture presence  M81.0 ALBUMIN     CALCIUM     VITAMIN D 25 TOTAL     CREATININE WITH EGFR     glipiZIDE (GLUCOTROL) 5 mg Oral Tablet     insulin glargine (LANTUS SOLOSTAR U-100 INSULIN) 100 unit/mL Subcutaneous Insulin Pen     95251 - INTERPRETATION/REPORT CONTINUOUS GLUCOSE MONITORING VIA SUBCUTANEOUS SENSOR (AMB ONLY)         Uncontrolled Diabetes mellitus.  A1c 8%    Type 2 diabetes mellitus superimposed with hyperglycemia from HAART therapy.   Unlikely related to pancreatic insufficiency    Free Style Libre CGM  Average days of CGM 14 days  Average blood glucose 152 mg/dl  Glucose variability 16.1%     70 % in range (70-180 mg/dl)    096% high (045 - 409 mg/dl)  81% very high (>191 mg/dl)    47% low (82-95 mg/dl)  62% very low (<13 mg/dl)       Plan  - decrease Lantus to 5 units once daily  - increase glipizide to 7.5 mg twice daily  - C-peptide levels ordered during the last visit not done  - pt is aware of his renal dysfunction.  Most recent renal function acceptable Used to follow up with [facility] nephrology    Osteoporosis    Risk factors: age, h/o prostate cancer, h/o HIV  Positive for atraumatic compression fractures.     DEXA Scan in June 2023  Anabolic agents contraindicated.       Plan  - calcium, albumin, vitamin-D, GFR  - order iv reclast. Dose 2 in August of 2024  - recommended 1000-2000 units vitamin D per  day  - DEXA scan in June 2025      Return in about 6 weeks (around 10/07/2022).  The above plan of care communicated to [facility]     Thad Ranger, MD    08/26/2022  12:40

## 2022-08-26 NOTE — Telephone Encounter (Signed)
Calcium, vitamin-D, GFR acceptable    Proceed with IV Reclast

## 2022-08-26 NOTE — Patient Instructions (Signed)
Decrease Lantus to 5 units once daily  Increase glipizide to 7.5 mg twice daily    No sugary beverages. (e.g sodas, milk, juices)  No diet or zero sugar beverages or beverages with artificial sweeteners   No snacking in between meals.     Check blood glucose 3 times daily before meals and at bedtime.  Call back in 6 weeks with glucose readings for medication adjustments.  Call our office if blood glucose is <70 or >200 mg/dl for 3 or more consecutive days.

## 2022-08-29 LAB — TISSUE TRANSGLUTAMINASE (TTG) ANTIBODY, IGA, SERUM
TISSUE TRANSGLUTAMINASE ANTIBODIES IGA QUALITATIVE: NEGATIVE
TISSUE TRANSGLUTAMINASE ANTIBODIES IGA QUANTITATIVE: <0.5 U/mL (ref <15.0)

## 2022-08-30 LAB — C-PEPTIDE: C-PEPTIDE: 11.8 ng/mL — ABNORMAL HIGH (ref 1.4–6.8)

## 2022-08-31 ENCOUNTER — Other Ambulatory Visit (HOSPITAL_BASED_OUTPATIENT_CLINIC_OR_DEPARTMENT_OTHER): Payer: Self-pay | Admitting: Internal Medicine

## 2022-09-05 ENCOUNTER — Telehealth (INDEPENDENT_AMBULATORY_CARE_PROVIDER_SITE_OTHER): Payer: Self-pay | Admitting: SURGICAL ONCOLOGY

## 2022-09-05 NOTE — Telephone Encounter (Signed)
Spoke to both Dr. 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 I let them know that Dr. Lissa Hoard reviewed the scans he had in July and wanted him to keep his appointments scheduled in November. Both voiced understanding.

## 2022-09-16 ENCOUNTER — Ambulatory Visit
Admission: RE | Admit: 2022-09-16 | Discharge: 2022-09-16 | Disposition: A | Discharge status: Home / Self Care | Payer: 59 | Source: Ambulatory Visit | Attending: Internal Medicine | Admitting: Internal Medicine

## 2022-09-16 DIAGNOSIS — M810 Age-related osteoporosis without current pathological fracture: Secondary | ICD-10-CM | POA: Insufficient documentation

## 2022-09-16 MED ORDER — ZOLEDRONIC ACID 5 MG/100 ML IN MANNITOL 5 %-WATER INTRAVENOUS PIGGYBCK
5.0000 mg | INJECTION | Freq: Once | INTRAVENOUS | Status: AC
Start: 2022-09-16 — End: 2022-09-16
  Administered 2022-09-16: 5 mg via INTRAVENOUS
  Administered 2022-09-16: 0 mg via INTRAVENOUS
  Filled 2022-09-16: qty 100

## 2022-09-16 NOTE — Nurses Notes (Signed)
Physician available during treatment. Pt tolerated treatment well. No needs or concerns expressed.

## 2022-10-07 ENCOUNTER — Other Ambulatory Visit: Payer: Self-pay

## 2022-10-11 ENCOUNTER — Telehealth (HOSPITAL_BASED_OUTPATIENT_CLINIC_OR_DEPARTMENT_OTHER): Payer: Self-pay | Admitting: INTERNAL MEDICINE

## 2022-10-11 NOTE — Telephone Encounter (Signed)
Called patient to remind him of upcoming procedure. Patient v/u.    Randa Evens, MA 10/11/22 10:10

## 2022-10-12 ENCOUNTER — Other Ambulatory Visit: Payer: Self-pay

## 2022-10-12 ENCOUNTER — Ambulatory Visit
Admission: RE | Admit: 2022-10-12 | Discharge: 2022-10-12 | Disposition: A | Discharge status: Home / Self Care | Payer: 59 | Source: Ambulatory Visit | Attending: INTERNAL MEDICINE | Admitting: INTERNAL MEDICINE

## 2022-10-12 ENCOUNTER — Encounter (HOSPITAL_COMMUNITY): Payer: Self-pay

## 2022-10-12 HISTORY — DX: Personal history of other medical treatment: Z92.89

## 2022-10-12 HISTORY — DX: Type 2 diabetes mellitus without complications: E11.9

## 2022-10-12 HISTORY — DX: Cerebral infarction, unspecified: I63.9

## 2022-10-12 HISTORY — DX: Other chronic pain: G89.29

## 2022-10-14 ENCOUNTER — Encounter (HOSPITAL_COMMUNITY)
Admission: RE | Disposition: A | Discharge status: Home / Self Care | Payer: Self-pay | Source: Ambulatory Visit | Attending: INTERNAL MEDICINE

## 2022-10-14 ENCOUNTER — Ambulatory Visit (HOSPITAL_COMMUNITY): Payer: 59 | Admitting: Anesthesiology

## 2022-10-14 ENCOUNTER — Ambulatory Visit
Admission: RE | Admit: 2022-10-14 | Discharge: 2022-10-14 | Disposition: A | Discharge status: Home / Self Care | Payer: 59 | Source: Ambulatory Visit | Attending: INTERNAL MEDICINE | Admitting: INTERNAL MEDICINE

## 2022-10-14 DIAGNOSIS — E785 Hyperlipidemia, unspecified: Secondary | ICD-10-CM | POA: Insufficient documentation

## 2022-10-14 DIAGNOSIS — K219 Gastro-esophageal reflux disease without esophagitis: Secondary | ICD-10-CM | POA: Insufficient documentation

## 2022-10-14 DIAGNOSIS — E039 Hypothyroidism, unspecified: Secondary | ICD-10-CM | POA: Insufficient documentation

## 2022-10-14 DIAGNOSIS — Z86010 Personal history of colonic polyps: Secondary | ICD-10-CM | POA: Insufficient documentation

## 2022-10-14 DIAGNOSIS — D509 Iron deficiency anemia, unspecified: Secondary | ICD-10-CM | POA: Insufficient documentation

## 2022-10-14 DIAGNOSIS — F32A Depression, unspecified: Secondary | ICD-10-CM | POA: Insufficient documentation

## 2022-10-14 DIAGNOSIS — K2950 Unspecified chronic gastritis without bleeding: Secondary | ICD-10-CM | POA: Insufficient documentation

## 2022-10-14 DIAGNOSIS — D120 Benign neoplasm of cecum: Secondary | ICD-10-CM | POA: Insufficient documentation

## 2022-10-14 DIAGNOSIS — Z794 Long term (current) use of insulin: Secondary | ICD-10-CM | POA: Insufficient documentation

## 2022-10-14 DIAGNOSIS — E119 Type 2 diabetes mellitus without complications: Secondary | ICD-10-CM | POA: Insufficient documentation

## 2022-10-14 DIAGNOSIS — K644 Residual hemorrhoidal skin tags: Secondary | ICD-10-CM | POA: Insufficient documentation

## 2022-10-14 DIAGNOSIS — B20 Human immunodeficiency virus [HIV] disease: Secondary | ICD-10-CM | POA: Insufficient documentation

## 2022-10-14 DIAGNOSIS — Z7984 Long term (current) use of oral hypoglycemic drugs: Secondary | ICD-10-CM | POA: Insufficient documentation

## 2022-10-14 DIAGNOSIS — Z1211 Encounter for screening for malignant neoplasm of colon: Secondary | ICD-10-CM | POA: Insufficient documentation

## 2022-10-14 DIAGNOSIS — I10 Essential (primary) hypertension: Secondary | ICD-10-CM | POA: Insufficient documentation

## 2022-10-14 DIAGNOSIS — Z79899 Other long term (current) drug therapy: Secondary | ICD-10-CM | POA: Insufficient documentation

## 2022-10-14 DIAGNOSIS — D122 Benign neoplasm of ascending colon: Secondary | ICD-10-CM | POA: Insufficient documentation

## 2022-10-14 SURGERY — COLONOSCOPY WITH POLYPECTOMY
Anesthesia: General | Wound class: Clean Contaminated Wounds-The respiratory, GI, Genital, or urinary

## 2022-10-14 MED ORDER — LIDOCAINE HCL 20 MG/ML (2 %) INJECTION SOLUTION
Freq: Once | INTRAMUSCULAR | Status: DC | PRN
Start: 2022-10-14 — End: 2022-10-14
  Administered 2022-10-14 (×4): 50 mg via INTRAVENOUS

## 2022-10-14 MED ORDER — LACTATED RINGERS INTRAVENOUS SOLUTION
1000.0000 mL | INTRAVENOUS | Status: DC
Start: 2022-10-14 — End: 2022-10-14
  Administered 2022-10-14: 1000 mL via INTRAVENOUS

## 2022-10-14 MED ORDER — SODIUM CHLORIDE 0.9 % (FLUSH) INJECTION SYRINGE
3.0000 mL | INJECTION | INTRAMUSCULAR | Status: DC | PRN
Start: 2022-10-14 — End: 2022-10-14

## 2022-10-14 MED ORDER — PROPOFOL 10 MG/ML INTRAVENOUS EMULSION
INTRAVENOUS | Status: AC
Start: 2022-10-14 — End: 2022-10-14
  Filled 2022-10-14: qty 20

## 2022-10-14 MED ORDER — SIMETHICONE 40 MG/0.6 ML ORAL DROPS,SUSPENSION
ORAL | Status: AC
Start: 2022-10-14 — End: 2022-10-14
  Filled 2022-10-14: qty 15

## 2022-10-14 MED ORDER — SODIUM CHLORIDE 0.9 % (FLUSH) INJECTION SYRINGE
3.0000 mL | INJECTION | Freq: Three times a day (TID) | INTRAMUSCULAR | Status: DC
Start: 2022-10-14 — End: 2022-10-14

## 2022-10-14 MED ORDER — LIDOCAINE HCL 20 MG/ML (2 %) INJECTION SOLUTION
INTRAMUSCULAR | Status: AC
Start: 2022-10-14 — End: 2022-10-14
  Filled 2022-10-14: qty 20

## 2022-10-14 MED ORDER — PROPOFOL 10 MG/ML IV BOLUS
INJECTION | Freq: Once | INTRAVENOUS | Status: DC | PRN
Start: 2022-10-14 — End: 2022-10-14
  Administered 2022-10-14: 50 mg via INTRAVENOUS
  Administered 2022-10-14: 100 mg via INTRAVENOUS
  Administered 2022-10-14 (×3): 50 mg via INTRAVENOUS

## 2022-10-14 SURGICAL SUPPLY — 45 items
AMPULE LAB ELEVIEW 10ML ENDOS RESCT PROC STRL LF DISP (MED SURG SUPPLIES) IMPLANT
APPL ENDOS 40CM 360D SPRAY SET SNPLK GAS AST TIP DUPLOSPRAY MIS LF (ENDOSCOPIC SUPPLIES) IMPLANT
CAP ENDOSCP SEAL 9.8-11.1MM MED RF ABLATION FLXB BARRX GIF-180 STRL DISP (ENDOSCOPIC SUPPLIES) IMPLANT
CAPSULE PH BRAVO CALIBRATE FREE RFLX DEL SYS (MED SURG SUPPLIES) IMPLANT
CAPSULE VIDEO PILLCAM PAT NONST LF  DISP (ENDOSCOPIC SUPPLIES) IMPLANT
CATH ABLATION BARRX 4MM 160CM 20X13MM 90D BIPOLAR ELECTRODE BAL FOCL RF ESPH ACPT 8.6-12.8MM SCP (ENDOSCOPIC SUPPLIES) IMPLANT
CATH ABLATION RF (CATHETERS) IMPLANT
CATH CRE 6-7-8MM 7.5FR 5.5CM 240CM 2.8MM 3.2MM LOW PROF GW BAL DIL ESOPH PYL BIL PEBAX STRL LF  DISP (ENDOSCOPIC SUPPLIES) IMPLANT
CATH ELHMST GLD PROBE 7FR 300CM BIPOLAR RND DIST TIP STD CONN FIRM SHAFT HMGLD STRL DISP 2.8MM MN (ENDOSCOPIC SUPPLIES) IMPLANT
CATH ENDOS BARRX 2.8MM 135CM FLXB CHNL RF ABLATION ESPH DISP (MED SURG SUPPLIES) IMPLANT
CATH PH MONITORING VERSAFLEX Z 6FR ZNIS+8R 8 RING DISP (MED SURG SUPPLIES) IMPLANT
CLIP HMST 2.8MM REPST DURACLIP 235CM 16MM CSCP (ENDOSCOPIC SUPPLIES) IMPLANT
CLIP HMST MR CONDITIONAL BRD CATH ROT CONTROL KNOB NO SHEATH RSL 360 235CM 2.8MM 11MM OPN (ENDOSCOPIC SUPPLIES) IMPLANT
CLIP HMST RADOPQ PRELD STRL DISP RSL 235CM 2.8MM 11MM OPN (ENDOSCOPIC SUPPLIES) IMPLANT
CLIP LGT RSL 360 ULTRA 235CM B_RD ROT CONTROL KNOB 17MM OPN (ENDOSCOPIC SUPPLIES) IMPLANT
DILATOR ENDOS CRE 180CM 8CM 10-11-12MM 6FR ESOPH BAL LOW PROF FIX WRE PEBAX STRL LF  DISP 2.8MM (ENDOSCOPIC SUPPLIES) IMPLANT
DILATOR ENDOS CRE 240CM 5.5CM 11-13.5-15MM 7.5FR ESOPH PYL BIL BAL LOW PROF GW PEBAX STRL LF  DISP (ENDOSCOPIC SUPPLIES) IMPLANT
DILATOR ENDOS CRE 240CM 5.5CM 15-16.5-18MM 7.5FR ESOPH PYL BIL BAL LOW PROF GW PEBAX STRL LF  DISP (ENDOSCOPIC SUPPLIES) IMPLANT
DILATOR ENDOS CRE 240CM 5.5CM 18-19-20MM 7.5FR ESOPH PYL BIL BAL LOW PROF GW PEBAX STRL LF  DISP 2.8 (ENDOSCOPIC SUPPLIES) IMPLANT
DILATOR ENDOS CRE 240CM 5.5CM 8-9-10MM 7.5FR ESOPH PYL BIL BAL LOW PROF GW PEBAX STRL LF  DISP 2.8MM (ENDOSCOPIC SUPPLIES) IMPLANT
DISC NO SUB - FORCEPS BIOPSY MLT SAMPLE 240CM 2.4MM MTBT 2.8MM STRL DISP ORNG (ENDOSCOPIC SUPPLIES) IMPLANT
ELECTRODE ESURG 85SQ CM 4MR NESSY OMG NONST DISP RTN PLATE NEUTRALIZE CABLE PLUG LF (MED SURG SUPPLIES) IMPLANT
ELEVIEW 10CC AMPOULES (MED SURG SUPPLIES)
FORCEPS BIOPSY 160CM 1.8MM RJ 4 PED 2+ MM DISP GASTROSCOPIC (ENDOSCOPIC SUPPLIES) IMPLANT
FORCEPS ENDOS HOT PRCS BITE 24_0CM 2.8MM 2.2MM RJ 4 CUP DISP (ENDOSCOPIC SUPPLIES) IMPLANT
FORCEPS ESURG 230X4MM COAGRASPER HMST STRL LF  DISP (ENDOSCOPIC SUPPLIES) IMPLANT
GW ENDOS .038IN 260CM BARRX RFA STR DIST TIP FLXB SS STRL DISP (ENDOSCOPIC SUPPLIES) IMPLANT
GW URO 200CM SAVARY FLX TP SS NONST (ENDOSCOPIC SUPPLIES) IMPLANT
KIT JEJUN 45CM 22FR SIL MIC SECURLOK LL-SLIP RADOPQ UNIV FEED PORT CONN INTERNAL RETENTION BAL TAPER (PEG) IMPLANT
KIT PEG 20FR SIL PUL RADOPQ ME_D PORT MIC SECURLOK 12ML STRL IMPLANT
LIGATOR 2.8MM 8.6-11.5MM SSS7 ESOPH 1 STNG MLT BAND HNDL STRL DISP ENDOS HMSTS LF (ENDOSCOPIC SUPPLIES) IMPLANT
MARKER ENDOS SPOT EX PERM IND DRK SYRG 5ML (MED SURG SUPPLIES) IMPLANT
NEEDLE SCLRTX 25GA 1.8MM CLR 4MM 200CM (ENDOSCOPIC SUPPLIES) IMPLANT
NEEDLE SCLRTX 25GA 2.3MM BVL STRL DISP STAR CATH INTJCT 4MM 240CM (ENDOSCOPIC SUPPLIES) IMPLANT
NEEDLE SCLRTX 25GA 2.3MM CSCP OPTC TIP STRL LF  DISP FLXTP 5MM 230CM STD (ENDOSCOPIC SUPPLIES) IMPLANT
NEEDLE SCLRTX 25GA 2.3MM STRL DISP FLXTP 5MM 160CM STD (ENDOSCOPIC SUPPLIES) IMPLANT
PROBE COAG 7.2FT 6.9FR FIAPC CRCMF PLUG PLAY FUNCTIONALITY FILTER (SURGICAL CUTTING SUPPLIES) IMPLANT
PROBE ESURG 300CM 2.3MM FIAPC FLXB CRCMF STRL DISP (SURGICAL CUTTING SUPPLIES) IMPLANT
RETRIEVER ENDOS BLU 230CM 2.5MM RESCUENET PROMESH NT LOOP SHEATH 5.5X3CM 2.8CM MN WRK CHNL (ENDOSCOPIC SUPPLIES) IMPLANT
SET ENDOS INSTR 220CM 8.5-11MM OTSC 6MM NITINOL STRL LF  DISP (ENDOSCOPIC SUPPLIES) ×1 IMPLANT
SNARE RND 240CM 2.4MM CAPTIVATR COLD STF THN WRE ENDOS PLPCTM 10MM DISP (ENDOSCOPIC SUPPLIES) ×1 IMPLANT
SYRINGE INFLAT ALN II GA STRL DISP 60ML (ENDOSCOPIC SUPPLIES) IMPLANT
TIP APPL 40CM SS DUPLOSPRAY (MED SURG SUPPLIES) IMPLANT
USE ITEM 305700 CLIP LGT RSL 360 ULTRA 235CM B_RD ROT CONTROL KNOB 17MM OPN (ENDOSCOPIC SUPPLIES) IMPLANT
WATER STRL 1000ML PLASTIC PR BTL LF (MED SURG SUPPLIES) IMPLANT

## 2022-10-14 NOTE — Anesthesia Transfer of Care (Signed)
ANESTHESIA TRANSFER OF CARE   [name] is a 77 y.o. ,male,     had Procedure(s):  COLONOSCOPY WITH POLYPECTOMY  GASTROSCOPY WITH BIOPSY  performed  10/14/22   Primary Service: Malena Catholic, MD    Past Medical History:   Diagnosis Date   . Cancer (CMS [facility])     prostate   . Chronic pain     "drop when being moved to CT scanner and fracture T8 which hurts most of the time, left hip hurts, "   . CVA (cerebrovascular accident) (CMS HCC)     TIA   . Deep vein thrombosis (DVT) (CMS HCC)     right leg   . Esophageal reflux    . H/O hearing loss    . High cholesterol    . 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 HTN (hypertension)    . 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 Hx of transfusion    . Hyperlipidemia     "borderline"   . Hypothyroidism    . MRSA (methicillin resistant staph aureus) culture positive 01/02/2021    MRSA left groin abscess 01/03/21   . MRSA (methicillin resistant staph aureus) culture positive 01/02/2021    MRSA blood 01/02/21   . Pulmonary embolism (CMS HCC) 03/31/2021   . Thyroid disorder    . Type 2 diabetes mellitus (CMS HCC)    . Wears glasses       Allergy History as of 10/14/22        No Known Allergies                  I completed my transfer of care / handoff to the receiving personnel during which we discussed:  All key/critical aspects of case discussed and Gave opportunity for questions and acknowledgement of understanding      Post Location: Phase II                        Additional Info:vss                                   Last OR Temp:    ABG:  PH (ARTERIAL)   Date Value Ref Range Status   04/04/2021 7.41 7.35 - 7.45 Final     PH (T)   Date Value Ref Range Status   03/22/2021 7.36 7.35 - 7.45 Final     PCO2 (ARTERIAL)   Date Value Ref Range Status   04/04/2021 41 35 - 45 mm/Hg Final     PCO2 (VENOUS)   Date Value Ref Range Status   05/05/2021 42 41 - 51 mm/Hg Final     PO2 (ARTERIAL)   Date Value Ref Range  Status   04/04/2021 77 72 - 100 mm/Hg Final     PO2 (VENOUS)   Date Value Ref Range Status   05/05/2021 58 (H) 35 - 50 mm/Hg Final     SODIUM   Date Value Ref Range Status   03/22/2021 135 (L) 137 - 145 mmol/L Final     POTASSIUM   Date Value Ref Range Status   08/01/2022 4.0 3.5 - 5.1 mmol/L Final   05/05/2022 4.2 3.5 - 5.1 mmol/L Final     KETONES   Date Value Ref Range Status   05/04/2022 Not  Detected Not Detected mg/dL Final     WHOLE BLOOD POTASSIUM   Date Value Ref Range Status   03/22/2021 3.6 3.5 - 4.6 mmol/L Final     CHLORIDE   Date Value Ref Range Status   03/22/2021 106 101 - 111 mmol/L Final     CALCIUM   Date Value Ref Range Status   08/26/2022 10.3 8.6 - 10.3 mg/dL Final     Comment:     Gadolinium-containing contrast can interfere with calcium measurement.       Calculated P Axis   Date Value Ref Range Status   05/04/2022 38 degrees Final     Calculated R Axis   Date Value Ref Range Status   05/04/2022 -5 degrees Final     Calculated T Axis   Date Value Ref Range Status   05/04/2022 34 degrees Final     IONIZED CALCIUM   Date Value Ref Range Status   05/04/2022 1.83 (HH) 1.15 - 1.33 mmol/L Final     LACTATE   Date Value Ref Range Status   05/05/2021 1.8 (H) 0.0 - 1.3 mmol/L Final     HEMOGLOBIN   Date Value Ref Range Status   03/22/2021 11.1 (L) 12.0 - 18.0 g/dL Final     OXYHEMOGLOBIN   Date Value Ref Range Status   03/22/2021 97.8 85.0 - 98.0 % Final     CARBOXYHEMOGLOBIN   Date Value Ref Range Status   03/22/2021 1.8 0.0 - 2.5 % Final     MET-HEMOGLOBIN   Date Value Ref Range Status   03/22/2021 0.4 0.0 - 2.0 % Final     BASE EXCESS   Date Value Ref Range Status   05/05/2021 5.4 (H) -3.0 - 3.0 mmol/L Final     BASE EXCESS (ARTERIAL)   Date Value Ref Range Status   04/04/2021 1.2 (H) 0.0 - 1.0 mmol/L Final     BASE DEFICIT   Date Value Ref Range Status   04/03/2021 0.2 0.0 - 3.0 mmol/L Final     BICARBONATE (ARTERIAL)   Date Value Ref Range Status   04/04/2021 25.8 18.0 - 26.0 mmol/L Final      BICARBONATE (VENOUS)   Date Value Ref Range Status   05/05/2021 29.0 (H) 22.0 - 26.0 mmol/L Final     TEMPERATURE, COMP   Date Value Ref Range Status   03/22/2021 36.9 15.0 - 40.0 C Final     %FIO2 (VENOUS)   Date Value Ref Range Status   05/05/2021 36.0 % Final     Airway:* No LDAs found *  Blood pressure 107/67, pulse 80, resp. rate 18, SpO2 98%.

## 2022-10-14 NOTE — OR Surgeon (Signed)
[facility]     Colonoscopy Operative Report    Name: [name]  DOB: 04-21-1945  MRN: [id number]   Date: 10/14/2022  PCP: [facility]     Name of procedure:  Colonoscopy with cold snare polypectomy   Indication: IDA, colon cancer surveillance   Anesthesia:  MAC by anesthesia  Endoscopist:  Dhiya Smits,MD    Description of procedure:      Prior to obtaining the procedure, a history and physical as performed and patients medications and allergies were reviewed. The procedure nature, risks, benefits and alternatives were  discussed with patient prior to obtaining informed consent. After adequate sedation, perianal and digital rectal exam done. The scope was then passed under direct visualization. Through out the entire procedure, the patients blood pressure, pulse and oxygen saturation was monitored continuously.  Colonoscope was introduced through the anus and advanced up to the cecum . The cecum was identified by presence of appendiceal orifice and ileocecal valve. The quality of the bowel prep was assessed using the [facility] Bowel Prep Score (BBPS). The total BBPS is 4. The right colon = 2, transverse colon= 2, the left colon= 0. Withdrawal time >6 minutes. The colonoscopy was accomplished without difficulty. Patient tolerated the procedure well, there were no complications and the patient was taken to the recovery room in stable condition.     Findings:  Perianal and digital rectal exam notable for hemorrhoids.   Sessile polyp in the cecum < 5 mm, resected with cold snare.   Sessile polyp in the ascending colon 10 mm resected with cold snare.   Large amount of stool in left colon precludes visualization.  There were external hemorrhoids noted on retroflexion.     Impression:  The bowel prep was inadequate.  Colon polyps.  External hemorrhoids    Recommendations:  Resume previous diet.   Resume current medication.  Repeat colonoscopy to be scheduled after discussion with patient.      Malena Catholic, MD  Gastroenterology and Hepatology       CC: [facility]

## 2022-10-14 NOTE — Anesthesia Postprocedure Evaluation (Signed)
Anesthesia Post Op Evaluation    Patient: [name]  Procedure(s):  COLONOSCOPY WITH POLYPECTOMY  GASTROSCOPY WITH BIOPSY    Last Vitals:Heart Rate: 80 (10/14/22 0755)  BP (Non-Invasive): 107/67 (10/14/22 0755)  Respiratory Rate: 18 (10/14/22 0755)  SpO2: 98 % (10/14/22 0755)    No notable events documented.    Patient is sufficiently recovered from the effects of anesthesia to participate in the evaluation and has returned to their pre-procedure level.  Patient location during evaluation: PACU       Patient participation: complete - patient participated  Level of consciousness: awake and alert and responsive to verbal stimuli    Pain management: adequate  Airway patency: patent    Anesthetic complications: no  Cardiovascular status: acceptable  Respiratory status: acceptable  Hydration status: acceptable  Patient post-procedure temperature: Pt Normothermic   PONV Status: Absent

## 2022-10-14 NOTE — H&P (Signed)
[facility]    Pre-Op History and Physical    Name: [name]   DOB: Nov 30, 1945  MRN: [id number]  PCP: [facility]  Date: 10/14/2022       HPI:    [name] is a 77 y.o. male with below medical history here for IDA-resolved, colon polyps, reflux symptoms     Patient History:  Past Medical History:   Diagnosis Date    Cancer (CMS [facility])     prostate    Chronic pain     "drop when being moved to CT scanner and fracture T8 which hurts most of the time, left hip hurts, "    CVA (cerebrovascular accident) (CMS HCC)     TIA    Deep vein thrombosis (DVT) (CMS HCC)     right leg    Esophageal reflux     H/O hearing loss     High cholesterol     History of kidney disease     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    HTN (hypertension)     Human immunodeficiency virus (HIV) disease (CMS HCC)     Hx of transfusion     Hyperlipidemia     "borderline"    Hypothyroidism     MRSA (methicillin resistant staph aureus) culture positive 01/02/2021    MRSA left groin abscess 01/03/21    MRSA (methicillin resistant staph aureus) culture positive 01/02/2021    MRSA blood 01/02/21    Pulmonary embolism (CMS HCC) 03/31/2021    Thyroid disorder     Type 2 diabetes mellitus (CMS HCC)     Wears glasses          Past Surgical History:   Procedure Laterality Date    COLON SURGERY      per patient had part of colon removed and had a colostomy    COLONOSCOPY      GASTROSCOPY  06/24/2021    Dr.Thakur    HIP SURGERY Right 04/22/2020    Right hip arthrotomy 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 Arva Chafe fo rMRSA    HX CERVICAL SPINE SURGERY  2001    no hardware    HX COLOSTOMY REVERSAL      HX HERNIA REPAIR Right     inguinal hernia    HX LAPAROTOMY      KNEE SURGERY Bilateral     "just cleaned up no replacements"    PANCREATECTOMY      2023, 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    SPLENECTOMY      2023    WRIST SURGERY Right          No Known Allergies  Family Medical History:       Problem Relation  (Age of Onset)    Cancer Mother, Father, Other    Heart Attack Mother    High Cholesterol Other    Stroke Other            Social History     Tobacco Use    Smoking status: Never    Smokeless tobacco: Never   Vaping Use    Vaping status: Never Used   Substance Use Topics    Alcohol use: Not Currently    Drug use: Never        Medications:   No current outpatient medications on file.         Physical exam    Vital signs reviewed   Alert, oriented x3  Abdomen soft, non tender  Legs with no edema     Assessment and Plan:    Proceed with EGD and colonoscopy     Malena Catholic, MD

## 2022-10-14 NOTE — OR Surgeon (Signed)
[facility]     EGD Operative Report    Name: [name]  DOB: [birth year]  Date: 10/14/2022  MRN: [id number]   PCP: [facility]     Name of procedure:  EGD with biopsy   Indication: Iron deficiency anemia, reflux symptoms  Anesthesia:  MAC by anesthesia.  Endoscopist:  Malena Catholic, MD    Description of procedure:    Prior to obtaining the procedure, a history and physical as performed and patients medications and allergies were reviewed. The procedure nature, risks, benefits and alternatives were discussed with patient prior to obtaining informed consent. The scope was then passed under direct visualization into the oropharynx. Through out the entire procedure, the patients blood pressure, pulse and oxygen saturation was monitored continuously. Endoscope was introduced in the esophagus and advanced to second part of duodenum. The upper endoscopy was accomplished without difficulty. Patient tolerated the procedure well, there were no complications and the patient was taken to the recovery room in stable condition.     Findings:  The esophagus was normal.    The Z-line was regular.  Mild mucosal erythema in the gastric antrum. Biopsies were obtained using cold forceps. The stomach was otherwise normal.  The bulb and the second part of duodenum were normal.      Impression:  Mild antral erythema  Otherwise normal EGD    Recommendations:  Proceed with colonoscopy today.     Malena Catholic, MD  Gastroenterology and Hepatology     CC: [facility]

## 2022-10-14 NOTE — Anesthesia Preprocedure Evaluation (Signed)
ANESTHESIA PRE-OP EVALUATION  [name]  Planned Procedure: COLONOSCOPY  GASTROSCOPY  Review of Systems    PONV       patient summary reviewed  nursing notes reviewed        Pulmonary  negative pulmonary ROS,    Cardiovascular    Hypertension, ECG reviewed, 3/23 echo  Left Ventricle: Normal left ventricular size. Concentric remodeling. Ejection Fraction is 62.6 %. Left ventricular systolic function is  normal. No segmental/regional wall motion abnormalities identified. Left ventricular diastolic parameters are normal.  Right Ventricle: Normal right ventricular size. Normal right ventricular systolic function. RV systolic pressure could not be determined  due to incomplete tricuspid regurgitation envelope.  Left Atrium: The left atrium is normal in size.  Right Atrium: The right atrium is of normal size.  Mitral Valve: No evidence of mitral regurgitation present.  Tricuspid Valve: The tricuspid valve is not well visualized. There is mild tricuspid regurgitation.  Aortic Valve: Trileaflet aortic valve. No Aortic valve stenosis.  Pulmonic Valve: The pulmonic valve is not well visualized. Trace pulmonic valve regurgitation present.  IVC/Hepatic Veins: The inferior vena cava was not visualized.  Aorta: The aortic, hyperlipidemia and DVT ,No past MI, no CAD and no CABG,  Exercise Tolerance: > or = 4 METS        GI/Hepatic/Renal    Hx of pancreatectomy, GERD and well controlled no liver disease and no renal insufficiency        Endo/Other    HIV, low viral load and CD4 > 500, hypothyroidism and anemia, no drug induced coagulopathy   type 2 diabetes/ controlled with insulin    Neuro/Psych/MS    TIA (3/23), back abnormality, depression no CVA      peripheral neuropathy,  Cancer  CA and prostate cancer,                   Physical Assessment      Airway       Mallampati: II    TM distance: >3 FB    Neck ROM: full  Mouth Opening: good.  No Facial hair  No Beard        Dental           (+) poor dentition, missing,  chipped           Pulmonary    Breath sounds clear to auscultation       Cardiovascular    Rhythm: regular  Rate: Abnormal       Other findings            Plan  ASA 3     Planned anesthesia type: general     general intravenous      PONV Plan:  I plan to administer pharmcologic prophalaxis antiemetics  POV PLAN:   plan for postoperative opioid use            Intravenous induction     Anesthesia issues/risks discussed are: Dental Injuries, Nerve Injuries, PONV, Post-op Cognitive Dysfunction, Post-op Agitation/Tantrum, Cardiac Events/MI, Stroke, Intraoperative Awareness/ Recall, Aspiration, Blood Loss and Sore Throat.  Anesthetic plan and risks discussed with patient  signed consent obtained      Use of blood products discussed with patient who consented to blood products.      Patient's NPO status is appropriate for Anesthesia.           Plan discussed with CRNA.

## 2022-10-14 NOTE — Discharge Instructions (Signed)
COLONOSCOPY/ SIGMOIDOSCOPY  RESTRICTION ON ACTIVITY  IF SEDATED:  Do not drive a car or operate machinery until the day after the procedure.  Following day: Return to full activity including work.    Diet:  Eat and drink normally unless instructed otherwise.    Treatment for common after effects:    For Colonoscopy or Sigmoidoscopy:  Mild abdominal pain, bloating or excess gas: Rest, eat lightly and use a heating pad.    Symptoms to watch for and report to your physician:  Chills or fever occurring within 24 hours after procedure  Severe abdominal pain or bloating  A large amount of rectal bleeding. (A small amount of blood from the rectum is not serious, especially if hemorrhoids are present).  Pain in the chest.    If a polyp has been removed:  For the next 7 days:  Do not take aspirin                                        Check with physician if taking a long car trip.  If bright red rectal bleeding occurs, call your physician.  For 3 days: No heavy lifting, straining, or running.    If scheduled for a Barium Enema, you will be given an instruction sheet in radiology.    If you have any problems, questions or concerns contact your physician.  If you are unable to reach your physician and feel you need immediate attention, go to the Emergency Room.    ESOPHAGOGASTRODUODENOSCOPY    RESTRICTION ON ACTIVITY  IF SEDATED:  Do not drive a car or operate machinery until the day after the procedure.  Following day: Return to full activity including work.    DIET:  Eat and drink normally unless instructed otherwise.    Treatment for common after effects:    For Esophagogastroduodenscopy:  Sore Throat: Treat with Throat Lozenges                          Gargle with warm salt water  Mild Abdominal pain and bloating: Rest and take only Liquids.    Symptoms to watch for and report to your physician:  Chills or fever occurring within 24 hours after procedure  Severe abdominal pain or bloating  Pain in the chest.      If you  have any problems, questions or concerns contact your physician.  If you are unable to reach your physician and feel you need immediate attention, go to the Emergency Room.

## 2022-10-17 ENCOUNTER — Ambulatory Visit (HOSPITAL_BASED_OUTPATIENT_CLINIC_OR_DEPARTMENT_OTHER): Payer: Medicare Other

## 2022-10-17 ENCOUNTER — Inpatient Hospital Stay (HOSPITAL_BASED_OUTPATIENT_CLINIC_OR_DEPARTMENT_OTHER)
Admission: RE | Admit: 2022-10-17 | Discharge: 2022-10-17 | Disposition: A | Discharge status: Home / Self Care | Payer: Medicare Other | Source: Ambulatory Visit | Attending: Orthopaedic Surgery | Admitting: Orthopaedic Surgery

## 2022-10-17 ENCOUNTER — Other Ambulatory Visit (INDEPENDENT_AMBULATORY_CARE_PROVIDER_SITE_OTHER): Payer: Self-pay | Admitting: Orthopaedic Surgery

## 2022-10-17 ENCOUNTER — Other Ambulatory Visit: Payer: Self-pay

## 2022-10-17 ENCOUNTER — Telehealth (INDEPENDENT_AMBULATORY_CARE_PROVIDER_SITE_OTHER): Payer: Self-pay | Admitting: Internal Medicine

## 2022-10-17 ENCOUNTER — Encounter (INDEPENDENT_AMBULATORY_CARE_PROVIDER_SITE_OTHER): Payer: Self-pay | Admitting: Orthopaedic Surgery

## 2022-10-17 ENCOUNTER — Ambulatory Visit (INDEPENDENT_AMBULATORY_CARE_PROVIDER_SITE_OTHER): Payer: 59 | Admitting: Orthopaedic Surgery

## 2022-10-17 ENCOUNTER — Other Ambulatory Visit: Payer: Medicare Other | Attending: Orthopaedic Surgery

## 2022-10-17 VITALS — BP 130/70 | Ht 70.0 in | Wt 170.0 lb

## 2022-10-17 DIAGNOSIS — Z01818 Encounter for other preprocedural examination: Secondary | ICD-10-CM

## 2022-10-17 DIAGNOSIS — D120 Benign neoplasm of cecum: Secondary | ICD-10-CM

## 2022-10-17 DIAGNOSIS — M87052 Idiopathic aseptic necrosis of left femur: Secondary | ICD-10-CM

## 2022-10-17 DIAGNOSIS — K2950 Unspecified chronic gastritis without bleeding: Secondary | ICD-10-CM

## 2022-10-17 DIAGNOSIS — D122 Benign neoplasm of ascending colon: Secondary | ICD-10-CM

## 2022-10-17 DIAGNOSIS — M87051 Idiopathic aseptic necrosis of right femur: Secondary | ICD-10-CM

## 2022-10-17 LAB — BASIC METABOLIC PANEL
ANION GAP: 12 mmol/L (ref 4–13)
BUN/CREA RATIO: 8 (ref 6–22)
BUN: 11 mg/dL (ref 8–25)
CALCIUM: 10.5 mg/dL — ABNORMAL HIGH (ref 8.6–10.3)
CHLORIDE: 103 mmol/L (ref 96–111)
CO2 TOTAL: 27 mmol/L (ref 23–31)
CREATININE: 1.43 mg/dL — ABNORMAL HIGH (ref 0.75–1.35)
ESTIMATED GFR - MALE: 50 mL/min/BSA — ABNORMAL LOW (ref >=60)
GLUCOSE: 144 mg/dL — ABNORMAL HIGH (ref 65–125)
POTASSIUM: 3.3 mmol/L — ABNORMAL LOW (ref 3.5–5.1)
SODIUM: 142 mmol/L (ref 136–145)

## 2022-10-17 LAB — CBC WITH DIFF
HCT: 46.3 % (ref 38.9–52.0)
HGB: 14.3 g/dL (ref 13.4–17.5)
MCH: 30.4 pg (ref 26.0–32.0)
MCHC: 30.9 g/dL — ABNORMAL LOW (ref 31.0–35.5)
MCV: 98.5 fL (ref 78.0–100.0)
MPV: 11.4 fL (ref 8.7–12.5)
PLATELETS: 310 10*3/uL (ref 150–400)
RBC: 4.7 10*6/uL (ref 4.50–6.10)
RDW-CV: 14.9 % (ref 11.5–15.5)
WBC: 12.6 10*3/uL — ABNORMAL HIGH (ref 3.7–11.0)

## 2022-10-17 LAB — MANUAL DIFF AND MORPHOLOGY-SYSMEX
BASOPHIL #: <0.1 10*3/uL (ref <=0.20)
BASOPHIL %: 0 %
EOSINOPHIL #: 0.38 10*3/uL (ref <=0.50)
EOSINOPHIL %: 3 %
LYMPHOCYTE #: 7.18 10*3/uL — ABNORMAL HIGH (ref 1.00–4.80)
LYMPHOCYTE %: 57 %
MONOCYTE #: 1.51 10*3/uL — ABNORMAL HIGH (ref 0.20–1.10)
MONOCYTE %: 12 %
NEUTROPHIL #: 3.53 10*3/uL (ref 1.50–7.70)
NEUTROPHIL %: 28 %
RBC MORPHOLOGY: NORMAL

## 2022-10-17 LAB — SURGICAL PATHOLOGY SPECIMEN

## 2022-10-17 NOTE — Telephone Encounter (Signed)
Patient called requesting a new Rx for libre 3 sensors and would like a 90 day supply to [facility] pharmacy.  I called him back to let him know an Rx was sent 06/09/2022 for a 90 day supply with 3 refills sp he should call [facility] pharmacy and request a 90 day supply refill from that Rx. Understanding verbalized.    Maryruth Hancock, RN

## 2022-10-17 NOTE — Progress Notes (Signed)
[facility] Note      [name]  10/17/2022    Information obtained from: Patient, EMR, and healthcare provider    CC:  Left hip pain    HPI: [name] is a 77 y.o. White male who presents with above-mentioned complaint.  Patient states that his left hip is not feeling well.  He was having increasing pain in his left groin.  He was being seen at the [facility].  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  He also has a history of diabetes.  His last A1c was 8.0 but he believes that this is a mistake.  He was unable to enjoy activities he wants did due to his left hip pain.    Allergies:   No Known Allergies    PMH:   Past Medical History:   Diagnosis Date    Cancer (CMS HCC)     prostate    Chronic pain     "drop when being moved to CT scanner and fracture T8 which hurts most of the time, left hip hurts, "    CVA (cerebrovascular accident) (CMS HCC)     TIA    Deep vein thrombosis (DVT) (CMS HCC)     right leg    Esophageal reflux     H/O hearing loss     High cholesterol     History of kidney disease     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    HTN (hypertension)     Human immunodeficiency virus (HIV) disease (CMS HCC)     Hx of transfusion     Hyperlipidemia     "borderline"    Hypothyroidism     MRSA (methicillin resistant staph aureus) culture positive 01/02/2021    MRSA left groin abscess 01/03/21    MRSA (methicillin resistant staph aureus) culture positive 01/02/2021    MRSA blood 01/02/21    Pulmonary embolism (CMS HCC) 03/31/2021    Thyroid disorder     Type 2 diabetes mellitus (CMS HCC)     Wears glasses            PSH:  Past Surgical History:   Procedure Laterality Date    COLON SURGERY      per patient had part of colon removed and had a colostomy    COLONOSCOPY      GASTROSCOPY  06/24/2021    Dr.Thakur    HIP SURGERY Right 04/22/2020    Right hip arthrotomy irrigation debridement of septic arthritis right  hip, Dr. Arva Chafe fo rMRSA    HX CERVICAL SPINE SURGERY  2001    no hardware    HX COLOSTOMY REVERSAL      HX HERNIA REPAIR Right     inguinal hernia    HX LAPAROTOMY      KNEE SURGERY Bilateral     "just cleaned up no replacements"    PANCREATECTOMY      2023, 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    SPLENECTOMY      2023    WRIST SURGERY Right            FMH:   Family Medical History:       Problem Relation (Age of Onset)    Cancer Mother, Father, Other    Heart Attack Mother    High Cholesterol Other    Stroke Other  Social:   Social History     Tobacco Use    Smoking status: Never    Smokeless tobacco: Never   Vaping Use    Vaping status: Never Used   Substance Use Topics    Alcohol use: Not Currently    Drug use: Never       Meds:   Current Outpatient Medications   Medication Sig    acetaminophen (TYLENOL) 325 mg Oral Tablet Take 2 Tablets (650 mg total) by mouth Every 6 hours as needed for Pain    amLODIPine (NORVASC) 10 mg Oral Tablet Take 1 Tablet (10 mg total) by mouth Once a day    atorvastatin (LIPITOR) 10 mg Oral Tablet Take 1 Tablet (10 mg total) by mouth Every evening    Blood-Glucose Meter,Continuous (FREESTYLE LIBRE 3 READER) Does not apply Misc 1 Kit continuous    Blood-Glucose Sensor (FREESTYLE LIBRE 3 SENSOR) Does not apply Device 1 Each Every 14 days    cyanocobalamin (VITAMIN B 12) 1,000 mcg Oral Tablet Take 1 Tablet (1,000 mcg total) by mouth Every morning    docusate sodium (COLACE) 100 mg Oral Capsule Take 1 Capsule (100 mg total) by mouth    elviteg-cob-emtri-tenof ALAFEN (GENVOYA) 150-150-200-10 mg Oral Tablet Take 1 Tablet by mouth Once a day    ferrous sulfate (FEOSOL) 325 mg (65 mg iron) Oral Tablet Take 1 Tablet (325 mg total) by mouth Once a day    gabapentin (NEURONTIN) 100 mg Oral Capsule Take 3 Capsules (300 mg total) by mouth Three times a day For shingle pain    glipiZIDE (GLUCOTROL) 5 mg Oral Tablet Take 1.5 tablet twice daily    insulin glargine (LANTUS SOLOSTAR U-100 INSULIN) 100 unit/mL  Subcutaneous Insulin Pen Inject 5 Units under the skin Every night    levothyroxine (SYNTHROID) 88 mcg Oral Tablet Take 75 mcg by mouth Every morning    melatonin 3 mg Oral Tablet Take 10 mg by mouth Every night    Mirtazapine (REMERON) 7.5 mg Oral Tablet Take 1 Tablet (7.5 mg total) by mouth Every night    MULTIVITAMIN ORAL Take 1 Tablet by mouth Once a day    nystatin (MYCOSTATIN) 100,000 unit/mL Oral Suspension SWISH BY MOUTH FOR 2 MINUTES AND THEN SPIT. DO NOT RINSE. REPEAT 4-5 TIMES A DAY    oxyCODONE (OXY IR) 5 mg Oral Capsule Take 1 Capsule (5 mg total) by mouth Twice daily    pantoprazole (PROTONIX) 40 mg Oral Tablet, Delayed Release (E.C.) Take 1 Tablet (40 mg total) by mouth Once a day    Pen Needle, Disposable, 32 gauge x 5/32" Needle Use one pen needle with daily Lantus injection.    sennosides-docusate sodium (SENOKOT-S) 8.6-50 mg Oral Tablet Take 1 Tablet by mouth Twice daily         ROS: As per HPI, otherwise all other ROS negative  Negative for fever or chills, SOB, chest pain or changes in bowel or bladder habits    PE:  Vitals: BP 130/70   Ht 1.778 m (5\' 10" )   Wt 77.1 kg (170 lb)   BMI 24.39 kg/m       General: No acute distress  Head: normocephalic and atraumatic   Eyes: no blue sclera, reactive pupils  Neck: non-tender, no pain with flexion/extension, negative spurling  Lymph: no inguinal or axillary lymphadenopathy  Resp: non labored breathing  CVS: Regular rate, no pedal edema  Abd: non distended, non-tender  Skin: warm and dry, no rashes  Neuro: no ataxia, normal muscle tone   Psych: mood/affect appropriate for clinical situation     Ext:     Left lower extremity:  Patient was neurovascularly intact.  Pain with internal and external rotation of his hip.  Leg lengths clinically are equal      Labs: I have reviewed all current lab results     Imaging:   XR and CT scan were reviewed.  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  We will check his labs today.  I will also reach out to Infectious Disease to see if any intervention her labs need to be done before his surgery.  His CD4 count has been good.  Risks benefits and alternatives of surgery were explained the patient.  Risks include were not limited to infection, wound complications, pain, damage to local structures, anesthesia risk, bleeding, thrombosis, etc..  Patient accepts these risks and wants to move forward with the procedure.    Gray Bernhardt, MD  Department of Orthopaedics

## 2022-10-18 ENCOUNTER — Encounter (INDEPENDENT_AMBULATORY_CARE_PROVIDER_SITE_OTHER): Payer: Self-pay

## 2022-10-18 DIAGNOSIS — Z01818 Encounter for other preprocedural examination: Secondary | ICD-10-CM

## 2022-10-18 LAB — ECG 12 LEAD
Atrial Rate: 72 {beats}/min
Calculated P Axis: 47 degrees
Calculated R Axis: 8 degrees
Calculated T Axis: 45 degrees
PR Interval: 166 ms
QRS Duration: 80 ms
QT Interval: 430 ms
QTC Calculation: 470 ms
Ventricular rate: 72 {beats}/min

## 2022-10-18 LAB — PATH COMMENT: PATHOLOGIST INTERPRETATION: ABNORMAL — AB

## 2022-10-18 LAB — HGA1C (HEMOGLOBIN A1C WITH EST AVG GLUCOSE): HEMOGLOBIN A1C: 7.4 % — ABNORMAL HIGH (ref <5.7)

## 2022-10-19 ENCOUNTER — Encounter (INDEPENDENT_AMBULATORY_CARE_PROVIDER_SITE_OTHER): Payer: Self-pay | Admitting: Orthopaedic Surgery

## 2022-10-19 LAB — MRSA (RULE OUT) SURVEILLANCE

## 2022-10-24 ENCOUNTER — Encounter (HOSPITAL_BASED_OUTPATIENT_CLINIC_OR_DEPARTMENT_OTHER): Payer: Self-pay | Admitting: Internal Medicine

## 2022-10-24 ENCOUNTER — Ambulatory Visit (HOSPITAL_BASED_OUTPATIENT_CLINIC_OR_DEPARTMENT_OTHER): Payer: 59 | Admitting: HEMATOLOGY/ONCOLOGY

## 2022-10-24 ENCOUNTER — Ambulatory Visit (HOSPITAL_BASED_OUTPATIENT_CLINIC_OR_DEPARTMENT_OTHER): Payer: Self-pay

## 2022-10-25 ENCOUNTER — Ambulatory Visit: Payer: 59 | Attending: HEMATOLOGY/ONCOLOGY | Admitting: HEMATOLOGY/ONCOLOGY

## 2022-10-25 ENCOUNTER — Encounter (HOSPITAL_BASED_OUTPATIENT_CLINIC_OR_DEPARTMENT_OTHER): Payer: Self-pay | Admitting: HEMATOLOGY/ONCOLOGY

## 2022-10-25 ENCOUNTER — Other Ambulatory Visit: Payer: Self-pay

## 2022-10-25 ENCOUNTER — Ambulatory Visit (HOSPITAL_BASED_OUTPATIENT_CLINIC_OR_DEPARTMENT_OTHER)
Admission: RE | Admit: 2022-10-25 | Discharge: 2022-10-25 | Disposition: A | Discharge status: Home / Self Care | Payer: Self-pay | Source: Ambulatory Visit

## 2022-10-25 VITALS — BP 113/77 | HR 69 | Temp 97.2°F | Resp 18 | Ht 70.0 in | Wt 162.4 lb

## 2022-10-25 DIAGNOSIS — D509 Iron deficiency anemia, unspecified: Secondary | ICD-10-CM

## 2022-10-25 DIAGNOSIS — Z8546 Personal history of malignant neoplasm of prostate: Secondary | ICD-10-CM | POA: Insufficient documentation

## 2022-10-25 DIAGNOSIS — D120 Benign neoplasm of cecum: Secondary | ICD-10-CM | POA: Insufficient documentation

## 2022-10-25 DIAGNOSIS — C61 Malignant neoplasm of prostate: Secondary | ICD-10-CM

## 2022-10-25 DIAGNOSIS — Z86718 Personal history of other venous thrombosis and embolism: Secondary | ICD-10-CM | POA: Insufficient documentation

## 2022-10-25 LAB — MANUAL DIFF AND MORPHOLOGY-SYSMEX
BASOPHIL #: <0.1 10*3/uL (ref <=0.20)
BASOPHIL %: 0 %
EOSINOPHIL #: 0.49 10*3/uL (ref <=0.50)
EOSINOPHIL %: 4 %
LYMPHOCYTE #: 7.08 10*3/uL — ABNORMAL HIGH (ref 1.00–4.80)
LYMPHOCYTE %: 51 %
MONOCYTE #: 0.37 10*3/uL (ref 0.20–1.10)
MONOCYTE %: 3 %
NEUTROPHIL #: 4.27 10*3/uL (ref 1.50–7.70)
NEUTROPHIL %: 35 %
RBC MORPHOLOGY: NORMAL
REACTIVE LYMPHOCYTE %: 7 %

## 2022-10-25 LAB — CBC WITH DIFF
HCT: 44 % (ref 38.9–52.0)
HGB: 13.8 g/dL (ref 13.4–17.5)
MCH: 30.6 pg (ref 26.0–32.0)
MCHC: 31.4 g/dL (ref 31.0–35.5)
MCV: 97.6 fL (ref 78.0–100.0)
MPV: 10.7 fL (ref 8.7–12.5)
PLATELETS: 316 10*3/uL (ref 150–400)
RBC: 4.51 10*6/uL (ref 4.50–6.10)
RDW-CV: 14.9 % (ref 11.5–15.5)
WBC: 12.2 10*3/uL — ABNORMAL HIGH (ref 3.7–11.0)

## 2022-10-25 LAB — FERRITIN: FERRITIN: 97 ng/mL (ref 20–300)

## 2022-10-25 NOTE — Cancer Center Note (Signed)
[facility]  [address]   [place], [place] [address]   [id number]          Name:[name]  Date of Birth: November 20, 1945  MRN: [id number]  Date of Service:  10/25/2022  Referring Provider: No ref. provider found     Chief Complaint: Other (6 mo f/u)     HPI:  [name] is a 77 y.o. who presents to the [facility] for evaluation and opinion regarding the venous thrombosis of right lower extremity.     He has significant past medical history of prostate cancer which has been treated with radiation therapy in 2017 and since then his prostate cancer is controlled as per patient with less than 0.1 of PSA.  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 He completed 3 months of anticoagulant therapy and then stopped the Eliquis after 3 months. He then had another surgery and developed PE's and was put on blood thinners again. He had a filter at some time, but the filter has since been removed. He is following at [facility] for management of his Eliquis. He was told to remain on a prophylaxis dose long term.         Patient seen today for follow up. He has hip surgery soon. Patient denies any new symptoms at this time.     Oncology History  Oncology History    No history exists.       Past Medical History  Past Medical History:   Diagnosis Date    Cancer (CMS [facility])     prostate    Chronic pain     "drop when being moved to CT scanner and fracture T8 which hurts most of the time, left hip hurts, "    CVA (cerebrovascular accident) (CMS HCC)     TIA    Deep vein thrombosis (DVT) (CMS HCC)     right leg    Esophageal reflux     H/O hearing loss     High cholesterol     History of kidney  disease     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    HTN (hypertension)     Human immunodeficiency virus (HIV) disease (CMS HCC)     Hx of transfusion     Hyperlipidemia     "borderline"    Hypothyroidism     MRSA (methicillin resistant staph aureus) culture positive 01/02/2021    MRSA left groin abscess 01/03/21    MRSA (methicillin resistant staph aureus) culture positive 01/02/2021    MRSA blood 01/02/21    Pulmonary embolism (CMS HCC) 03/31/2021    Thyroid disorder     Type 2 diabetes mellitus (CMS HCC)     Wears glasses          Past Surgical History:   Procedure Laterality Date    COLON SURGERY      per patient had part of colon removed and had a colostomy    COLONOSCOPY      GASTROSCOPY  06/24/2021    Dr.Thakur    HIP SURGERY Right 04/22/2020    Right hip arthrotomy 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  Harter fo rMRSA    HX CERVICAL SPINE SURGERY  2001    no hardware    HX COLOSTOMY REVERSAL      HX HERNIA REPAIR Right     inguinal hernia    HX LAPAROTOMY      KNEE SURGERY Bilateral     "just cleaned up no replacements"    PANCREATECTOMY      2023, 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    SPLENECTOMY      2023    WRIST SURGERY Right          Current Outpatient Medications   Medication Sig Dispense Refill    acetaminophen (TYLENOL) 325 mg Oral Tablet Take 2 Tablets (650 mg total) by mouth Every 6 hours as needed for Pain      amLODIPine (NORVASC) 10 mg Oral Tablet Take 1 Tablet (10 mg total) by mouth Once a day      atorvastatin (LIPITOR) 10 mg Oral Tablet Take 1 Tablet (10 mg total) by mouth Every evening      Blood-Glucose Meter,Continuous (FREESTYLE LIBRE 3 READER) Does not apply Misc 1 Kit continuous 1 Each 0    Blood-Glucose Sensor (FREESTYLE LIBRE 3 SENSOR) Does not apply Device 1 Each Every 14 days 6 Each 3    cyanocobalamin (VITAMIN B 12) 1,000 mcg Oral Tablet Take 1 Tablet (1,000 mcg total) by mouth Every morning      docusate sodium (COLACE) 100 mg Oral Capsule Take 1 Capsule (100 mg total) by  mouth      elviteg-cob-emtri-tenof ALAFEN (GENVOYA) 150-150-200-10 mg Oral Tablet Take 1 Tablet by mouth Once a day 90 Tablet 1    ferrous sulfate (FEOSOL) 325 mg (65 mg iron) Oral Tablet Take 1 Tablet (325 mg total) by mouth Once a day 30 Tablet 3    gabapentin (NEURONTIN) 100 mg Oral Capsule Take 3 Capsules (300 mg total) by mouth Three times a day For shingle pain      glipiZIDE (GLUCOTROL) 5 mg Oral Tablet Take 1.5 tablet twice daily 180 Tablet 3    insulin glargine (LANTUS SOLOSTAR U-100 INSULIN) 100 unit/mL Subcutaneous Insulin Pen Inject 5 Units under the skin Every night 15 mL 0    levothyroxine (SYNTHROID) 88 mcg Oral Tablet Take 75 mcg by mouth Every morning      melatonin 3 mg Oral Tablet Take 10 mg by mouth Every night      Mirtazapine (REMERON) 7.5 mg Oral Tablet Take 1 Tablet (7.5 mg total) by mouth Every night      MULTIVITAMIN ORAL Take 1 Tablet by mouth Once a day      nystatin (MYCOSTATIN) 100,000 unit/mL Oral Suspension SWISH BY MOUTH FOR 2 MINUTES AND THEN SPIT. DO NOT RINSE. REPEAT 4-5 TIMES A DAY      oxyCODONE (OXY IR) 5 mg Oral Capsule Take 1 Capsule (5 mg total) by mouth Twice daily      pantoprazole (PROTONIX) 40 mg Oral Tablet, Delayed Release (E.C.) Take 1 Tablet (40 mg total) by mouth Once a day      Pen Needle, Disposable, 32 gauge x 5/32" Needle Use one pen needle with daily Lantus injection. 100 Each 3    sennosides-docusate sodium (SENOKOT-S) 8.6-50 mg Oral Tablet Take 1 Tablet by mouth Twice daily       No current facility-administered medications for this visit.     No Known Allergies  Social History     Tobacco Use    Smoking status: Never  Smokeless tobacco: Never   Vaping Use    Vaping status: Never Used   Substance Use Topics    Alcohol use: Not Currently    Drug use: Never      Family Medical History:       Problem Relation (Age of Onset)    Cancer Mother, Father, Other    Heart Attack Mother    High Cholesterol Other    Stroke Other            Review of Systems  As  above  Physical Examination: BP 113/77   Pulse 69   Temp 36.2 C (97.2 F)   Resp 18   Ht 1.778 m (5\' 10" )   Wt 73.7 kg (162 lb 6.4 oz)   SpO2 95%   BMI 23.30 kg/m    Performance Status:  0  General appearance: well appearing, alert, in no acute distress.  Skin: skin color, texture, tugor normal, no rashes or lesions  Head: nomocephalic, no masses, lesions, tendernesss or abdomalities  Eyes: Anicteric sclera.  Extraocular movements are intact  Ears: external ears normal  Oropharynx: Oropharynx clear, no erthema, no thrush.  Neck: Supple,thyroid not palpable  YQM:VHQIO, oriented and grossly intact    LABS  Results for orders placed or performed during the hospital encounter of 10/25/22 (from the past 72 hour(s))   BLOOD CELL COUNT W/DIFF - CANCER CENTER    Narrative    The following orders were created for panel order BLOOD CELL COUNT W/DIFF - CANCER CENTER.  Procedure                               Abnormality         Status                     ---------                               -----------         ------                     CBC WITH NGEX[[id number]]                Abnormal            Final result                 Please view results for these tests on the individual orders.   FERRITIN   Result Value Ref Range    FERRITIN 97 20 - 300 ng/mL   CBC WITH DIFF   Result Value Ref Range    WBC 12.2 (H) 3.7 - 11.0 x10^3/uL    RBC 4.51 4.50 - 6.10 x10^6/uL    HGB 13.8 13.4 - 17.5 g/dL    HCT 01.0 27.2 - 53.6 %    MCV 97.6 78.0 - 100.0 fL    MCH 30.6 26.0 - 32.0 pg    MCHC 31.4 31.0 - 35.5 g/dL    RDW-CV 64.4 03.4 - 74.2 %    PLATELETS 316 150 - 400 x10^3/uL    MPV 10.7 8.7 - 12.5 fL   MANUAL DIFF AND MORPHOLOGY-SYSMEX   Result Value Ref Range    NEUTROPHIL % 35 %    LYMPHOCYTE %  51 %    MONOCYTE % 3 %    EOSINOPHIL %  4 %    BASOPHIL % 0 %    REACTIVE LYMPHOCYTE % 7 %    NEUTROPHIL # 4.27 1.50 - 7.70 x10^3/uL    LYMPHOCYTE # 7.08 (H) 1.00 - 4.80 x10^3/uL    MONOCYTE # 0.37 0.20 - 1.10 x10^3/uL    EOSINOPHIL # 0.49  <=0.50 x10^3/uL    BASOPHIL # <0.10 <=0.20 x10^3/uL    SMUDGE CELLS Present (A) None    RBC MORPHOLOGY Normal RBC and PLT Morphology        Outside lab CBC is within normal limits creatinine is 1.22 LFTs are within normal limit LDH is slightly elevated.   Radiology  No results were found from the past 30 days.    Last Mammogram (if applicable): No results found for this or any previous visit (from the past 04540 hour(s)).    Last PetScan (if applicable): No results found for this or any previous visit (from the past 98119 hour(s)).    Recent Pathology  Last Pathology/Cytology Result   SURGICAL PATHOLOGY SPECIMEN (Collected: 10/14/2022  7:40 AM)   Result Value Ref Range    Final Diagnosis       A.  STOMACH, RANDOM BIOPSY:   GASTRIC MUCOSA WITH MILD NONSPECIFIC CHRONIC INFLAMMATION;   NEGATIVE FOR H PYLORI BY IMMUNOHISTOCHEMISTRY.    B.  CECAL POLYP:   TUBULAR ADENOMA.    C.  ASCENDING COLON POLYP:   TUBULAR ADENOMA.      Gross Description       A: Gastric  Formalin, Netty Starring, stomach random biopsy.  Present is a single 0.4 cm tan tissue fragment which is inked and embedded.  B: Cecum  Formalin, Netty Starring, cecum polyp.  Present is a single 0.3 cm tan tissue fragment which is inked and embedded.  C: Colon Ascending  Formalin, Netty Starring, ascending polyp.  Present are two 0.3 cm tan tissue fragments which are inked and embedded.     Last Pathology/Cytology Result   CYTOPATHOLOGY, FINE NEEDLE ASPIRATE (Collected: 02/08/2021 12:58 PM)   Result Value Ref Range    Interpretation       A. Pancreatic cyst, tail, endoscopic ultrasound-guided fine needle aspiration, cytology with cell block:  Satisfactory for evaluation.  NEOPLASTIC: OTHER  Mucinous cyst fluid with low-grade atypia, see comment.         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 Smears are essentially acellular with scant possible wispy mucin. Cell block shows few strips of  mucinous epithelium with low-grade atypia. Cyst fluid CEA and amylase are not available. PancreaSeq is pending. Case reviewed at [facility].           Gross Description       A: Pancreatic Cyst  Specimen consist of  <0.5 mL of cloudy, red fluid without fixative.   A cell block and 2 smears were prepared.               Assessment and Plan    ICD-10-CM    1. Malignant neoplasm of prostate (CMS [facility])  C61         77 y.o. male with history of right-sided tibial vein thrombosis and PEs both of which were provoked due to surgery. Eliquis was d/c at last appointment.     IDA--colonic polyp, Patient seen today for follow up. His HGB has increased to 13.8. Advised patient to continue to take oral iron and folate.  Advised patient to take blood thinner after hip surgery for at least 6 weeks.     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 Lissa Hoard and PCP.     Prostate ca in remission---[facility].       Patient can be referred back or schedule an appointment if needed.     Return visit as scheduled.      I am scribing for, and in the presence of, Dr. Gwendel Hanson for services provided on 10/25/2022.  Prescilla Sours, SCRIBE   Prescilla Sours, SCRIBE    I personally performed the services described in this documentation, as scribed  in my presence, and it is both accurate  and complete.    Gwendel Hanson, MD

## 2022-10-25 NOTE — Nursing Note (Signed)
Discharged from Clinic; F/U as needed

## 2022-10-27 ENCOUNTER — Other Ambulatory Visit (INDEPENDENT_AMBULATORY_CARE_PROVIDER_SITE_OTHER): Payer: Self-pay | Admitting: PHYSICIAN ASSISTANT

## 2022-10-27 ENCOUNTER — Other Ambulatory Visit (INDEPENDENT_AMBULATORY_CARE_PROVIDER_SITE_OTHER): Payer: Self-pay | Admitting: Internal Medicine

## 2022-10-27 ENCOUNTER — Telehealth (INDEPENDENT_AMBULATORY_CARE_PROVIDER_SITE_OTHER): Payer: Self-pay | Admitting: Internal Medicine

## 2022-10-27 DIAGNOSIS — B20 Human immunodeficiency virus [HIV] disease: Secondary | ICD-10-CM

## 2022-10-27 MED ORDER — GENVOYA 150 MG-150 MG-200 MG-10 MG TABLET
1.0000 | ORAL_TABLET | Freq: Every day | ORAL | 1 refills | Status: DC
Start: 2022-10-27 — End: 2023-05-01

## 2022-10-27 MED ORDER — FREESTYLE LIBRE 2 SENSOR KIT
PACK | 5 refills | Status: DC
Start: 2022-10-27 — End: 2023-02-09

## 2022-10-27 NOTE — Nursing Note (Signed)
[facility] Genvoya. Verified that refills appropriate. Sent to pharmacy per protocol.

## 2022-10-27 NOTE — Telephone Encounter (Signed)
[facility] cannot get his Josephine Igo 3 sensors right now due to national backorder.  Please send a 1 month Rx of Libre 2 sensors to them.    Maryruth Hancock, RN

## 2022-11-01 ENCOUNTER — Encounter (INDEPENDENT_AMBULATORY_CARE_PROVIDER_SITE_OTHER): Payer: Self-pay | Admitting: Internal Medicine

## 2022-11-01 ENCOUNTER — Ambulatory Visit: Payer: 59 | Attending: Internal Medicine | Admitting: Internal Medicine

## 2022-11-01 ENCOUNTER — Encounter (HOSPITAL_COMMUNITY): Payer: Self-pay

## 2022-11-01 ENCOUNTER — Other Ambulatory Visit (INDEPENDENT_AMBULATORY_CARE_PROVIDER_SITE_OTHER): Payer: Self-pay | Admitting: Internal Medicine

## 2022-11-01 ENCOUNTER — Other Ambulatory Visit: Payer: Self-pay

## 2022-11-01 ENCOUNTER — Ambulatory Visit (HOSPITAL_COMMUNITY)
Admission: RE | Admit: 2022-11-01 | Discharge: 2022-11-01 | Disposition: A | Discharge status: Home / Self Care | Payer: 59 | Source: Ambulatory Visit

## 2022-11-01 DIAGNOSIS — E1165 Type 2 diabetes mellitus with hyperglycemia: Secondary | ICD-10-CM | POA: Insufficient documentation

## 2022-11-01 DIAGNOSIS — Z7984 Long term (current) use of oral hypoglycemic drugs: Secondary | ICD-10-CM

## 2022-11-01 DIAGNOSIS — Z794 Long term (current) use of insulin: Secondary | ICD-10-CM | POA: Insufficient documentation

## 2022-11-01 DIAGNOSIS — M810 Age-related osteoporosis without current pathological fracture: Secondary | ICD-10-CM

## 2022-11-01 DIAGNOSIS — Z978 Presence of other specified devices: Secondary | ICD-10-CM | POA: Insufficient documentation

## 2022-11-01 HISTORY — DX: Enterocolitis due to Clostridium difficile, not specified as recurrent: A04.72

## 2022-11-01 MED ORDER — FREESTYLE LIBRE 3 SENSOR DEVICE
1.0000 | 3 refills | Status: DC
Start: 2022-11-01 — End: 2022-11-16

## 2022-11-01 MED ORDER — GLIPIZIDE 5 MG TABLET
ORAL_TABLET | ORAL | 3 refills | Status: DC
Start: 2022-11-01 — End: 2022-12-26

## 2022-11-01 NOTE — Progress Notes (Signed)
[facility]  [address] [place], [place] [address]  Dept: [phone]  Fax: [phone]    ENDOCRINOLOGY OUTPATIENT PROGRESS NOTE    Name: [name]  Age: 77 y.o., Sex: Male  MRN: [id number]    Referred by: [facility]  Reason for Consult: Diabetes Mellitus    HISTORY OF PRESENT ILLNESS:     [name] is a 76 y.o. male  who is here for his visit for diabetes.   Established care for osteoporosis.       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 A1c 9.3%    Following his discharge, patient stopped taking all insulin. A1c and glucose levels improved on own.   Suspecting DM related to type 2 rather than pancreatic insufficiency    C-peptide levels in July of 2024 11.8    A1c 7.4 %    Current Regimen:   Lantus 5 units once a day  Glipizide 7.5 mg twice daily  Unable to tolerate metformin      Pertinent Osteoporosis History    S/p iv reclast in aug 2024 (Dose 2)    Known history of compression fracture since early 2023.  Atraumatic    Positive for prostate cancer.  On surveillance  Positive for HIV.  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    Past Family History  Mother with cancer and heart disease    Past Surgical History  Distal pancreatectomy    Social History   No history of smoking, alcohol use or illicit drug use      Allergies  No Known Allergies     Medication History     Current Outpatient Medications   Medication Sig    acetaminophen (TYLENOL) 325 mg Oral Tablet Take  2 Tablets (650 mg total) by mouth Every 6 hours as needed for Pain    amLODIPine (NORVASC) 10 mg Oral Tablet Take 1 Tablet (10 mg total) by mouth Once a day    atorvastatin (LIPITOR) 10 mg Oral Tablet Take 1 Tablet (10 mg total) by mouth Every evening    Blood-Glucose Meter,Continuous (FREESTYLE LIBRE 3 READER) Does not apply Misc 1 Kit continuous    Blood-Glucose Sensor (FREESTYLE LIBRE 3 SENSOR) Does not apply Device 1 Each Every 14 days    cyanocobalamin (VITAMIN B 12) 1,000 mcg Oral Tablet Take 1 Tablet (1,000 mcg total) by mouth Every morning    docusate sodium (COLACE) 100 mg Oral Capsule Take 1 Capsule (100 mg total) by mouth    elvitegravir-cobicistat-emtricitrabine-tenofovir alafen (GENVOYA) 150-150-200-10 mg Oral Tablet Take 1 Tablet by mouth Once a day    ferrous sulfate (FEOSOL) 325 mg (65 mg iron) Oral Tablet Take 1 Tablet (325 mg total) by mouth Once a day    flash glucose sensor (FREESTYLE LIBRE 2 SENSOR) Does not apply Kit Change every 14 days    gabapentin (NEURONTIN) 100 mg Oral Capsule Take 3 Capsules (300 mg total) by mouth Three times  a day For shingle pain    glipiZIDE (GLUCOTROL) 5 mg Oral Tablet Take 1.5 tablet twice daily    levothyroxine (SYNTHROID) 88 mcg Oral Tablet Take 75 mcg by mouth Every morning    melatonin 3 mg Oral Tablet Take 10 mg by mouth Every night    Mirtazapine (REMERON) 7.5 mg Oral Tablet Take 1 Tablet (7.5 mg total) by mouth Every night    MULTIVITAMIN ORAL Take 1 Tablet by mouth Once a day    nystatin (MYCOSTATIN) 100,000 unit/mL Oral Suspension SWISH BY MOUTH FOR 2 MINUTES AND THEN SPIT. DO NOT RINSE. REPEAT 4-5 TIMES A DAY (Patient not taking: Reported on 11/01/2022)    oxyCODONE (OXY IR) 5 mg Oral Capsule Take 1 Capsule (5 mg total) by mouth Twice daily    pantoprazole (PROTONIX) 40 mg Oral Tablet, Delayed Release (E.C.) Take 1 Tablet (40 mg total) by mouth Once a day    Pen Needle, Disposable, 32 gauge x 5/32" Needle Use one pen needle with daily Lantus injection.     sennosides-docusate sodium (SENOKOT-S) 8.6-50 mg Oral Tablet Take 1 Tablet by mouth Twice daily        OBJECTIVE:     BP 126/74   Pulse 76   Ht 1.778 m (5\' 10" )   Wt 73 kg (160 lb 15 oz)   SpO2 98%   BMI 23.09 kg/m        Physical Exam  Constitutional:       General: He is not in acute distress.     Appearance: He is not ill-appearing.   Pulmonary:      Effort: Pulmonary effort is normal. No respiratory distress.   Neurological:      Mental Status: He is alert and oriented to person, place, and time. Mental status is at baseline.   Psychiatric:         Mood and Affect: Mood normal.         Behavior: Behavior normal.         Thought Content: Thought content normal.         Judgment: Judgment normal.       Lab Investigations and Imaging  I have personally reviewed and interpreted all pertinent labs and imaging    BASIC METABOLIC PANEL  Lab Results   Component Value Date    SODIUM 142 10/17/2022    POTASSIUM 3.3 (L) 10/17/2022    CHLORIDE 103 10/17/2022    CO2 27 10/17/2022    ANIONGAP 12 10/17/2022    BUN 11 10/17/2022    CREATININE 1.43 (H) 10/17/2022    BUNCRRATIO 8 10/17/2022    GFR 50 (L) 10/17/2022    CALCIUM 10.5 (H) 10/17/2022    GLUCOSENF 135 (H) 08/01/2022        ASSESSMENT & PLAN:       ICD-10-CM    1. Type 2 diabetes mellitus with hyperglycemia, with long-term current use of insulin (CMS HCC)  E11.65 glipiZIDE (GLUCOTROL) 5 mg Oral Tablet    Z79.4 95251 - INTERPRETATION/REPORT CONTINUOUS GLUCOSE MONITORING VIA SUBCUTANEOUS SENSOR (AMB ONLY)     C-PEPTIDE      2. Uses self-applied continuous glucose monitoring device  Z97.8 glipiZIDE (GLUCOTROL) 5 mg Oral Tablet     95251 - INTERPRETATION/REPORT CONTINUOUS GLUCOSE MONITORING VIA SUBCUTANEOUS SENSOR (AMB ONLY)     C-PEPTIDE        Uncontrolled Diabetes mellitus.  A1c 7.4%        Type 2 diabetes mellitus superimposed with hyperglycemia  from HAART therapy.   Unlikely related to pancreatic insufficiency    Free Style Libre CGM  Average days of CGM 14  days  Average blood glucose 130 mg/dl  GMI 5.6%     21% in range (70-180 mg/dl)    30% high (865 - 784 mg/dl)  2% very high (>696 mg/dl)    3% low (29-52 mg/dl)  0% very low (<84 mg/dl)     GFR in the 13'K  Patient notes that he is not having significant hypoglycemia.  It most of the highs from the CGM download is from a technical glitch from the sensor    Plan  - discontinue Lantus  - continue glipizide 7.5 mg twice daily  - C-peptide levels ordered again for the next visit.  Previous 1 markedly elevated at 11.3.        Osteoporosis    Risk factors: age, h/o prostate cancer, h/o HIV  Positive for atraumatic compression fractures.     DEXA Scan in June 2023  Anabolic agents contraindicated.       Plan  - due for IV Reclast.  Dose 3 in August of 2025  - DEXA scan in June of 2025    Return in about 6 months (around 05/02/2023).  The above plan of care communicated to [facility]     Thad Ranger, MD    11/01/2022  11:07

## 2022-11-01 NOTE — Anesthesia Preprocedure Evaluation (Addendum)
ANESTHESIA PRE-OP EVALUATION  Planned Procedure: ARTHROPLASTY HIP TOTAL (Left)  Review of Systems    no PONV   anesthesia history negative               Pulmonary    no COPD, no asthma, no shortness of breath, no sleep apnea and denies history of smoking   Cardiovascular    Hypertension, ECG reviewed, EKG- Sinus with nonspecific ST and T wave abnormality , hyperlipidemia and DVT (PE and DVT- after surgery) ,No past MI, no dysrhythmias and no angina,  Exercise Tolerance: > or = 4 METS        GI/Hepatic/Renal    Splenectomy  Pancreas removed  HIV positive  Hx of colostomy with reversal, GERD and renal insufficiency no liver disease        Endo/Other    hypothyroidism, no drug induced coagulopathy   type 2 diabetes (A1c-7.4)    Neuro/Psych/MS    TIA, anxiety, depression no seizures, no CVA       Cancer  prostate cancer (2017),                   Physical Assessment      Airway       Mallampati: II    TM distance: >3 FB    Neck ROM: full  Mouth Opening: good.            Dental       Dentition intact             Pulmonary    Breath sounds clear to auscultation       Cardiovascular    Rhythm: regular  Rate: Normal  (-) no friction rub and no murmur     Other findings            Plan  ASA 3     Planned anesthesia type: general     general anesthesia with endotracheal tube intubation                        Anesthesia issues/risks discussed are: PONV and Cardiac Events/MI.  Anesthetic plan and risks discussed with patient           Patient's NPO status is appropriate for Anesthesia.           Plan discussed with CRNA.

## 2022-11-01 NOTE — Telephone Encounter (Signed)
Last scheduled appointment with you was 11/01/2022.  Currently scheduled future appointment is 05/02/2023.    Pt needs a refill on Libre 3 to [place] 90 day supply.     [place], [place]  11/01/2022, 14:43

## 2022-11-01 NOTE — Nurses Notes (Signed)
Met with patient in PAT to provide education.  The patient is scheduled to have a Left THA  on 11/03/22.  The patient plans to be admitted to the hospital the day of surgery.       The patient uses a cane for ambulation.            D/C PLAN    Patient lives alone or with someone. The patient lives with his wife.      Will you have assistance at home and can someone provide you with transportation, who?  The patient's wife will provide the patient with transportation and assistance while he recovers.        Do you have a Front wheeled walker?  The patient has a FWW and will bring it the day of surgery.      How many levels is your home?  The patient lives in an one level home.     How many steps? Do you have a Railing?  The patient has a ramp entry.      Where are you planning to go to for your outpatient rehab? Is an appointment already set up?  Date of appointment    The patient plans to go to [facility] at [address] location.  The paient does not have a post op appointment.

## 2022-11-03 ENCOUNTER — Ambulatory Visit (INDEPENDENT_AMBULATORY_CARE_PROVIDER_SITE_OTHER): Payer: 59

## 2022-11-03 ENCOUNTER — Ambulatory Visit (HOSPITAL_COMMUNITY): Payer: 59 | Admitting: Nurse Practitioner

## 2022-11-03 ENCOUNTER — Ambulatory Visit
Admission: RE | Admit: 2022-11-03 | Discharge: 2022-11-04 | Disposition: A | Discharge status: Home / Self Care | Payer: 59 | Source: Ambulatory Visit | Attending: Orthopaedic Surgery | Admitting: Orthopaedic Surgery

## 2022-11-03 ENCOUNTER — Ambulatory Visit (HOSPITAL_COMMUNITY): Payer: 59

## 2022-11-03 ENCOUNTER — Encounter (HOSPITAL_COMMUNITY)
Admission: RE | Disposition: A | Discharge status: Home / Self Care | Payer: Self-pay | Source: Ambulatory Visit | Attending: Orthopaedic Surgery

## 2022-11-03 ENCOUNTER — Ambulatory Visit (HOSPITAL_COMMUNITY): Payer: 59 | Admitting: Orthopaedic Surgery

## 2022-11-03 ENCOUNTER — Ambulatory Visit (HOSPITAL_COMMUNITY)
Admission: RE | Admit: 2022-11-03 | Discharge: 2022-11-03 | Disposition: A | Discharge status: Home / Self Care | Payer: 59 | Source: Ambulatory Visit | Admitting: Radiology

## 2022-11-03 ENCOUNTER — Ambulatory Visit (HOSPITAL_COMMUNITY): Payer: 59 | Admitting: Certified Registered"

## 2022-11-03 DIAGNOSIS — M87852 Other osteonecrosis, left femur: Secondary | ICD-10-CM | POA: Insufficient documentation

## 2022-11-03 DIAGNOSIS — N182 Chronic kidney disease, stage 2 (mild): Secondary | ICD-10-CM | POA: Insufficient documentation

## 2022-11-03 DIAGNOSIS — Z8546 Personal history of malignant neoplasm of prostate: Secondary | ICD-10-CM | POA: Insufficient documentation

## 2022-11-03 DIAGNOSIS — Z794 Long term (current) use of insulin: Secondary | ICD-10-CM | POA: Insufficient documentation

## 2022-11-03 DIAGNOSIS — K219 Gastro-esophageal reflux disease without esophagitis: Secondary | ICD-10-CM | POA: Insufficient documentation

## 2022-11-03 DIAGNOSIS — Z79899 Other long term (current) drug therapy: Secondary | ICD-10-CM | POA: Insufficient documentation

## 2022-11-03 DIAGNOSIS — F419 Anxiety disorder, unspecified: Secondary | ICD-10-CM | POA: Insufficient documentation

## 2022-11-03 DIAGNOSIS — B20 Human immunodeficiency virus [HIV] disease: Secondary | ICD-10-CM | POA: Insufficient documentation

## 2022-11-03 DIAGNOSIS — Z9081 Acquired absence of spleen: Secondary | ICD-10-CM | POA: Insufficient documentation

## 2022-11-03 DIAGNOSIS — Z471 Aftercare following joint replacement surgery: Secondary | ICD-10-CM

## 2022-11-03 DIAGNOSIS — F32A Depression, unspecified: Secondary | ICD-10-CM | POA: Insufficient documentation

## 2022-11-03 DIAGNOSIS — Z8673 Personal history of transient ischemic attack (TIA), and cerebral infarction without residual deficits: Secondary | ICD-10-CM | POA: Insufficient documentation

## 2022-11-03 DIAGNOSIS — Z8719 Personal history of other diseases of the digestive system: Secondary | ICD-10-CM | POA: Diagnosis present

## 2022-11-03 DIAGNOSIS — Z90411 Acquired partial absence of pancreas: Secondary | ICD-10-CM | POA: Diagnosis present

## 2022-11-03 DIAGNOSIS — D509 Iron deficiency anemia, unspecified: Secondary | ICD-10-CM | POA: Insufficient documentation

## 2022-11-03 DIAGNOSIS — E039 Hypothyroidism, unspecified: Secondary | ICD-10-CM | POA: Insufficient documentation

## 2022-11-03 DIAGNOSIS — Z90410 Acquired total absence of pancreas: Secondary | ICD-10-CM | POA: Insufficient documentation

## 2022-11-03 DIAGNOSIS — Z8614 Personal history of Methicillin resistant Staphylococcus aureus infection: Secondary | ICD-10-CM | POA: Insufficient documentation

## 2022-11-03 DIAGNOSIS — Z21 Asymptomatic human immunodeficiency virus [HIV] infection status: Secondary | ICD-10-CM | POA: Diagnosis present

## 2022-11-03 DIAGNOSIS — I129 Hypertensive chronic kidney disease with stage 1 through stage 4 chronic kidney disease, or unspecified chronic kidney disease: Secondary | ICD-10-CM | POA: Insufficient documentation

## 2022-11-03 DIAGNOSIS — E785 Hyperlipidemia, unspecified: Secondary | ICD-10-CM

## 2022-11-03 DIAGNOSIS — Z7984 Long term (current) use of oral hypoglycemic drugs: Secondary | ICD-10-CM | POA: Insufficient documentation

## 2022-11-03 DIAGNOSIS — M810 Age-related osteoporosis without current pathological fracture: Secondary | ICD-10-CM | POA: Insufficient documentation

## 2022-11-03 DIAGNOSIS — Z86711 Personal history of pulmonary embolism: Secondary | ICD-10-CM | POA: Insufficient documentation

## 2022-11-03 DIAGNOSIS — Z86718 Personal history of other venous thrombosis and embolism: Secondary | ICD-10-CM | POA: Insufficient documentation

## 2022-11-03 DIAGNOSIS — M1612 Unilateral primary osteoarthritis, left hip: Principal | ICD-10-CM | POA: Insufficient documentation

## 2022-11-03 DIAGNOSIS — Z96642 Presence of left artificial hip joint: Secondary | ICD-10-CM | POA: Diagnosis not present

## 2022-11-03 DIAGNOSIS — E1122 Type 2 diabetes mellitus with diabetic chronic kidney disease: Secondary | ICD-10-CM | POA: Insufficient documentation

## 2022-11-03 HISTORY — PX: HX HIP SURGERY: 2100001310

## 2022-11-03 LAB — TYPE AND SCREEN
ABO/RH(D): A NEG
ANTIBODY SCREEN: NEGATIVE

## 2022-11-03 LAB — POC BLOOD GLUCOSE (RESULTS)
GLUCOSE, POC: 209 mg/dL (ref 80–130)
GLUCOSE, POC: 234 mg/dL (ref 80–130)

## 2022-11-03 SURGERY — ARTHROPLASTY HIP TOTAL
Anesthesia: General | Site: Hip | Laterality: Left | Wound class: Clean Wound: Uninfected operative wounds in which no inflammation occurred

## 2022-11-03 MED ORDER — SUGAMMADEX 100 MG/ML INTRAVENOUS SOLUTION
INTRAVENOUS | Status: AC
Start: 2022-11-03 — End: 2022-11-03
  Filled 2022-11-03: qty 2

## 2022-11-03 MED ORDER — EPINEPHRINE HCL (PF) 1 MG/ML (1 ML) INJECTION SOLUTION
Freq: Once | INTRAMUSCULAR | Status: DC | PRN
Start: 2022-11-03 — End: 2022-11-03
  Administered 2022-11-03: .25 mL via INTRAMUSCULAR

## 2022-11-03 MED ORDER — MORPHINE 4 MG/ML INTRAVENOUS SOLUTION
Freq: Once | INTRAVENOUS | Status: DC | PRN
Start: 2022-11-03 — End: 2022-11-03
  Administered 2022-11-03 (×2): 2 mg via INTRAVENOUS

## 2022-11-03 MED ORDER — ONDANSETRON HCL (PF) 4 MG/2 ML INJECTION SOLUTION
4.0000 mg | Freq: Once | INTRAMUSCULAR | Status: DC | PRN
Start: 2022-11-03 — End: 2022-11-03

## 2022-11-03 MED ORDER — MORPHINE 4 MG/ML INTRAVENOUS SOLUTION
INTRAVENOUS | Status: AC
Start: 2022-11-03 — End: 2022-11-03
  Filled 2022-11-03: qty 1

## 2022-11-03 MED ORDER — DEXAMETHASONE SODIUM PHOSPHATE 4 MG/ML INJECTION SOLUTION
10.0000 mg | Freq: Once | INTRAMUSCULAR | Status: DC
Start: 2022-11-03 — End: 2022-11-03

## 2022-11-03 MED ORDER — PROPOFOL 10 MG/ML IV BOLUS
INJECTION | Freq: Once | INTRAVENOUS | Status: DC | PRN
Start: 2022-11-03 — End: 2022-11-03
  Administered 2022-11-03: 150 mg via INTRAVENOUS

## 2022-11-03 MED ORDER — LACTATED RINGERS INTRAVENOUS SOLUTION
INTRAVENOUS | Status: DC
Start: 2022-11-03 — End: 2022-11-03
  Administered 2022-11-03: 0 mL via INTRAVENOUS

## 2022-11-03 MED ORDER — SODIUM CHLORIDE 0.9 % (FLUSH) INJECTION SYRINGE
3.0000 mL | INJECTION | Freq: Three times a day (TID) | INTRAMUSCULAR | Status: DC
Start: 2022-11-03 — End: 2022-11-04
  Administered 2022-11-03 (×2): 0 mL
  Administered 2022-11-04: 3 mL

## 2022-11-03 MED ORDER — MAGNESIUM HYDROXIDE 400 MG/5 ML ORAL SUSPENSION
30.0000 mL | Freq: Two times a day (BID) | ORAL | Status: DC | PRN
Start: 2022-11-03 — End: 2022-11-04

## 2022-11-03 MED ORDER — ROPIVACAINE (PF) 2 MG/ML (0.2 %) INJECTION SOLUTION
INTRAMUSCULAR | Status: AC
Start: 2022-11-03 — End: 2022-11-03
  Filled 2022-11-03: qty 40

## 2022-11-03 MED ORDER — ONDANSETRON HCL (PF) 4 MG/2 ML INJECTION SOLUTION
INTRAMUSCULAR | Status: AC
Start: 2022-11-03 — End: 2022-11-03
  Filled 2022-11-03: qty 2

## 2022-11-03 MED ORDER — NALOXONE 1 MG/ML INJECTION SYRINGE
1.0000 mg | INJECTION | INTRAMUSCULAR | Status: DC | PRN
Start: 2022-11-03 — End: 2022-11-04

## 2022-11-03 MED ORDER — KETOROLAC 30 MG/ML (1 ML) INJECTION SOLUTION
INTRAMUSCULAR | Status: AC
Start: 2022-11-03 — End: 2022-11-03
  Filled 2022-11-03: qty 1

## 2022-11-03 MED ORDER — CEFAZOLIN 1 GRAM/50 ML IN DEXTROSE (ISO-OSMOTIC) INTRAVENOUS PIGGYBACK
1.0000 g | INJECTION | Freq: Three times a day (TID) | INTRAVENOUS | Status: AC
Start: 2022-11-03 — End: 2022-11-04
  Administered 2022-11-03 (×2): 0 g via INTRAVENOUS
  Administered 2022-11-03 (×2): 1 g via INTRAVENOUS
  Administered 2022-11-04: 0 g via INTRAVENOUS
  Administered 2022-11-04: 1 g via INTRAVENOUS
  Filled 2022-11-03 (×3): qty 50

## 2022-11-03 MED ORDER — RACEPINEPHRINE 2.25 % SOLUTION FOR NEBULIZATION
0.5000 mL | INHALATION_SOLUTION | Freq: Once | RESPIRATORY_TRACT | Status: DC | PRN
Start: 2022-11-03 — End: 2022-11-03

## 2022-11-03 MED ORDER — OXYCODONE ER 10 MG TABLET,CRUSH RESISTANT,EXTENDED RELEASE 12 HR
10.0000 mg | EXTENDED_RELEASE_ORAL_TABLET | Freq: Once | ORAL | Status: AC
Start: 2022-11-03 — End: 2022-11-03
  Administered 2022-11-03: 10 mg via ORAL

## 2022-11-03 MED ORDER — EPINEPHRINE HCL (PF) 1 MG/ML (1 ML) INJECTION SOLUTION
INTRAMUSCULAR | Status: AC
Start: 2022-11-03 — End: 2022-11-03
  Filled 2022-11-03: qty 1

## 2022-11-03 MED ORDER — HYDROMORPHONE (PF) 0.5 MG/0.5 ML INJECTION SYRINGE
INJECTION | INTRAMUSCULAR | Status: AC
Start: 2022-11-03 — End: 2022-11-03
  Filled 2022-11-03: qty 0.5

## 2022-11-03 MED ORDER — ROCURONIUM 10 MG/ML INTRAVENOUS SOLUTION
Freq: Once | INTRAVENOUS | Status: DC | PRN
Start: 2022-11-03 — End: 2022-11-03
  Administered 2022-11-03: 50 mg via INTRAVENOUS

## 2022-11-03 MED ORDER — DEXAMETHASONE SODIUM PHOSPHATE 4 MG/ML INJECTION SOLUTION
INTRAMUSCULAR | Status: AC
Start: 2022-11-03 — End: 2022-11-03
  Filled 2022-11-03: qty 1

## 2022-11-03 MED ORDER — PANTOPRAZOLE 40 MG TABLET,DELAYED RELEASE
40.0000 mg | DELAYED_RELEASE_TABLET | Freq: Every day | ORAL | Status: DC
Start: 2022-11-04 — End: 2022-11-04
  Administered 2022-11-04: 40 mg via ORAL
  Filled 2022-11-03: qty 1

## 2022-11-03 MED ORDER — VANCOMYCIN IV - PHARMACIST TO DOSE PER PROTOCOL
Freq: Once | Status: DC | PRN
Start: 2022-11-03 — End: 2022-11-04

## 2022-11-03 MED ORDER — SODIUM CHLORIDE 0.9 % (FLUSH) INJECTION SYRINGE
3.0000 mL | INJECTION | Freq: Three times a day (TID) | INTRAMUSCULAR | Status: DC
Start: 2022-11-03 — End: 2022-11-03
  Administered 2022-11-03: 0 mL

## 2022-11-03 MED ORDER — LACTATED RINGERS INTRAVENOUS SOLUTION
INTRAVENOUS | Status: DC
Start: 2022-11-03 — End: 2022-11-04
  Administered 2022-11-03: 0 mL via INTRAVENOUS

## 2022-11-03 MED ORDER — ONDANSETRON HCL 4 MG TABLET
4.0000 mg | ORAL_TABLET | Freq: Three times a day (TID) | ORAL | 0 refills | Status: DC | PRN
Start: 2022-11-03 — End: 2023-02-09

## 2022-11-03 MED ORDER — VANCOMYCIN 1,000 MG INTRAVENOUS INJECTION
INTRAVENOUS | Status: AC
Start: 2022-11-03 — End: 2022-11-03
  Filled 2022-11-03: qty 10

## 2022-11-03 MED ORDER — ACETAMINOPHEN 500 MG TABLET
1000.0000 mg | ORAL_TABLET | Freq: Once | ORAL | Status: DC
Start: 2022-11-03 — End: 2022-11-03

## 2022-11-03 MED ORDER — LEVOTHYROXINE 75 MCG TABLET
75.0000 ug | ORAL_TABLET | Freq: Every morning | ORAL | Status: DC
Start: 2022-11-04 — End: 2022-11-04
  Administered 2022-11-04: 75 ug via ORAL
  Filled 2022-11-03: qty 1

## 2022-11-03 MED ORDER — PROPOFOL 10 MG/ML INTRAVENOUS EMULSION
INTRAVENOUS | Status: AC
Start: 2022-11-03 — End: 2022-11-03
  Filled 2022-11-03: qty 20

## 2022-11-03 MED ORDER — DEXTROSE 40 % ORAL GEL
15.0000 g | ORAL | Status: DC | PRN
Start: 2022-11-03 — End: 2022-11-04

## 2022-11-03 MED ORDER — SUGAMMADEX 100 MG/ML INTRAVENOUS SOLUTION
Freq: Once | INTRAVENOUS | Status: DC | PRN
Start: 2022-11-03 — End: 2022-11-03
  Administered 2022-11-03: 200 mg via INTRAVENOUS

## 2022-11-03 MED ORDER — INSULIN REGULAR HUMAN 100 UNIT/ML INJECTION CORRECTIONAL - CCMC
1.0000 [IU] | Freq: Once | INTRAMUSCULAR | Status: DC | PRN
Start: 2022-11-03 — End: 2022-11-03
  Administered 2022-11-03: 4 [IU] via SUBCUTANEOUS

## 2022-11-03 MED ORDER — INSULIN U-100 REGULAR HUMAN 100 UNIT/ML INJECTION SOLUTION
INTRAMUSCULAR | Status: AC
Start: 2022-11-03 — End: 2022-11-03
  Filled 2022-11-03: qty 4

## 2022-11-03 MED ORDER — SODIUM CHLORIDE 0.9 % (FLUSH) INJECTION SYRINGE
3.0000 mL | INJECTION | INTRAMUSCULAR | Status: DC | PRN
Start: 2022-11-03 — End: 2022-11-03

## 2022-11-03 MED ORDER — NALOXONE 0.4 MG/ML INJECTION SOLUTION
0.4000 mg | INTRAMUSCULAR | Status: DC | PRN
Start: 2022-11-03 — End: 2022-11-04

## 2022-11-03 MED ORDER — METOCLOPRAMIDE 5 MG/ML INJECTION SOLUTION
10.0000 mg | Freq: Once | INTRAMUSCULAR | Status: DC | PRN
Start: 2022-11-03 — End: 2022-11-03

## 2022-11-03 MED ORDER — AMLODIPINE 10 MG TABLET
10.0000 mg | ORAL_TABLET | Freq: Every day | ORAL | Status: DC
Start: 2022-11-04 — End: 2022-11-04
  Administered 2022-11-04: 10 mg via ORAL
  Filled 2022-11-03: qty 1

## 2022-11-03 MED ORDER — LACTATED RINGERS INTRAVENOUS SOLUTION
INTRAVENOUS | Status: DC
Start: 2022-11-03 — End: 2022-11-03

## 2022-11-03 MED ORDER — GLUCAGON HCL 1 MG/ML SOLUTION FOR INJECTION
1.0000 mg | Freq: Once | INTRAMUSCULAR | Status: DC | PRN
Start: 2022-11-03 — End: 2022-11-04

## 2022-11-03 MED ORDER — IPRATROPIUM 0.5 MG-ALBUTEROL 3 MG (2.5 MG BASE)/3 ML NEBULIZATION SOLN
3.0000 mL | INHALATION_SOLUTION | Freq: Once | RESPIRATORY_TRACT | Status: DC | PRN
Start: 2022-11-03 — End: 2022-11-03

## 2022-11-03 MED ORDER — OXYCODONE-ACETAMINOPHEN 5 MG-325 MG TABLET
1.0000 | ORAL_TABLET | ORAL | 0 refills | Status: DC | PRN
Start: 2022-11-03 — End: 2022-11-10

## 2022-11-03 MED ORDER — FERROUS SULFATE 325 MG (65 MG IRON) TABLET
325.0000 mg | ORAL_TABLET | Freq: Every day | ORAL | Status: DC
Start: 2022-11-03 — End: 2022-11-04
  Administered 2022-11-03 – 2022-11-04 (×2): 325 mg via ORAL
  Filled 2022-11-03 (×2): qty 1

## 2022-11-03 MED ORDER — KETOROLAC 30 MG/ML (1 ML) INJECTION SOLUTION
30.0000 mg | Freq: Once | INTRAMUSCULAR | Status: AC
Start: 2022-11-03 — End: 2022-11-03
  Administered 2022-11-03: 30 mg via INTRAVENOUS

## 2022-11-03 MED ORDER — ATORVASTATIN 10 MG TABLET
10.0000 mg | ORAL_TABLET | Freq: Every evening | ORAL | Status: DC
Start: 2022-11-03 — End: 2022-11-04
  Administered 2022-11-03: 10 mg via ORAL
  Filled 2022-11-03: qty 1

## 2022-11-03 MED ORDER — CEFAZOLIN 2 GRAM/100 ML IN DEXTROSE(ISO-OSMOTIC) INTRAVENOUS PIGGYBACK
2.0000 g | INJECTION | Freq: Once | INTRAVENOUS | Status: AC
Start: 2022-11-03 — End: 2022-11-03
  Administered 2022-11-03: 2 g via INTRAVENOUS
  Filled 2022-11-03: qty 100

## 2022-11-03 MED ORDER — VANCOMYCIN 1,000 MG IV POWDER - FOR OR
Freq: Once | TOPICAL | Status: DC | PRN
Start: 2022-11-03 — End: 2022-11-03
  Administered 2022-11-03: 1 g

## 2022-11-03 MED ORDER — ALUMINUM-MAG HYDROXIDE-SIMETHICONE 200 MG-200 MG-20 MG/5 ML ORAL SUSP
30.0000 mL | ORAL | Status: DC | PRN
Start: 2022-11-03 — End: 2022-11-04

## 2022-11-03 MED ORDER — SODIUM CHLORIDE 0.9 % (FLUSH) INJECTION SYRINGE
3.0000 mL | INJECTION | INTRAMUSCULAR | Status: DC | PRN
Start: 2022-11-03 — End: 2022-11-04

## 2022-11-03 MED ORDER — LIDOCAINE HCL 20 MG/ML (2 %) INJECTION SOLUTION
Freq: Once | INTRAMUSCULAR | Status: DC | PRN
Start: 2022-11-03 — End: 2022-11-03
  Administered 2022-11-03: 60 mg

## 2022-11-03 MED ORDER — DEXTROSE 50 % IN WATER (D50W) INTRAVENOUS SYRINGE
12.5000 g | INJECTION | INTRAVENOUS | Status: DC | PRN
Start: 2022-11-03 — End: 2022-11-04

## 2022-11-03 MED ORDER — HYDROMORPHONE (PF) 0.5 MG/0.5 ML INJECTION SYRINGE
0.2500 mg | INJECTION | INTRAMUSCULAR | Status: DC | PRN
Start: 2022-11-03 — End: 2022-11-03

## 2022-11-03 MED ORDER — MULTIVITAMIN WITH FOLIC ACID 400 MCG TABLET
1.0000 | ORAL_TABLET | Freq: Every day | ORAL | Status: DC
Start: 2022-11-03 — End: 2022-11-04
  Administered 2022-11-03 – 2022-11-04 (×2): 1 via ORAL
  Filled 2022-11-03 (×2): qty 1

## 2022-11-03 MED ORDER — HYDROMORPHONE (PF) 0.5 MG/0.5 ML INJECTION SYRINGE
0.5000 mg | INJECTION | INTRAMUSCULAR | Status: AC | PRN
Start: 2022-11-03 — End: 2022-11-03
  Administered 2022-11-03 (×4): 0.5 mg via INTRAVENOUS

## 2022-11-03 MED ORDER — FENTANYL (PF) 50 MCG/ML INJECTION SOLUTION
Freq: Once | INTRAMUSCULAR | Status: DC | PRN
Start: 2022-11-03 — End: 2022-11-03
  Administered 2022-11-03 (×2): 50 ug via INTRAVENOUS

## 2022-11-03 MED ORDER — SIMETHICONE 80 MG CHEWABLE TABLET
80.0000 mg | CHEWABLE_TABLET | Freq: Three times a day (TID) | ORAL | Status: DC | PRN
Start: 2022-11-03 — End: 2022-11-04

## 2022-11-03 MED ORDER — FENTANYL (PF) 50 MCG/ML INJECTION SOLUTION
INTRAMUSCULAR | Status: AC
Start: 2022-11-03 — End: 2022-11-03
  Filled 2022-11-03: qty 2

## 2022-11-03 MED ORDER — TRANEXAMIC ACID 650 MG TABLET
1300.0000 mg | ORAL_TABLET | Freq: Once | ORAL | Status: DC
Start: 2022-11-03 — End: 2022-11-03

## 2022-11-03 MED ORDER — DOCUSATE SODIUM 100 MG CAPSULE
100.0000 mg | ORAL_CAPSULE | Freq: Two times a day (BID) | ORAL | Status: DC
Start: 2022-11-03 — End: 2022-11-04
  Administered 2022-11-03 – 2022-11-04 (×3): 100 mg via ORAL
  Filled 2022-11-03 (×4): qty 1

## 2022-11-03 MED ORDER — OXYCODONE ER 10 MG TABLET,CRUSH RESISTANT,EXTENDED RELEASE 12 HR
EXTENDED_RELEASE_ORAL_TABLET | ORAL | Status: AC
Start: 2022-11-03 — End: 2022-11-03
  Filled 2022-11-03: qty 1

## 2022-11-03 MED ORDER — SODIUM CHLORIDE 0.9 % (FLUSH) INJECTION SYRINGE
3.0000 mL | INJECTION | Freq: Three times a day (TID) | INTRAMUSCULAR | Status: DC
Start: 2022-11-03 — End: 2022-11-03

## 2022-11-03 MED ORDER — GABAPENTIN 300 MG CAPSULE
300.0000 mg | ORAL_CAPSULE | Freq: Three times a day (TID) | ORAL | Status: DC
Start: 2022-11-03 — End: 2022-11-04
  Administered 2022-11-03 – 2022-11-04 (×3): 300 mg via ORAL
  Filled 2022-11-03 (×3): qty 1

## 2022-11-03 MED ORDER — OXYCODONE-ACETAMINOPHEN 5 MG-325 MG TABLET
1.0000 | ORAL_TABLET | ORAL | Status: DC | PRN
Start: 2022-11-03 — End: 2022-11-04

## 2022-11-03 MED ORDER — MORPHINE 4 MG/ML INTRAVENOUS SOLUTION
4.0000 mg | INTRAVENOUS | Status: DC | PRN
Start: 2022-11-03 — End: 2022-11-03

## 2022-11-03 MED ORDER — ASPIRIN 81 MG TABLET,DELAYED RELEASE
81.0000 mg | DELAYED_RELEASE_TABLET | Freq: Two times a day (BID) | ORAL | 0 refills | Status: DC
Start: 2022-11-03 — End: 2022-12-14

## 2022-11-03 MED ORDER — TRANEXAMIC ACID 650 MG TABLET
1300.0000 mg | ORAL_TABLET | Freq: Once | ORAL | Status: AC
Start: 2022-11-03 — End: 2022-11-03
  Administered 2022-11-03: 1300 mg via ORAL
  Filled 2022-11-03: qty 2

## 2022-11-03 MED ORDER — CYANOCOBALAMIN (VIT B-12) 500 MCG TABLET
1000.0000 ug | ORAL_TABLET | Freq: Every morning | ORAL | Status: DC
Start: 2022-11-03 — End: 2022-11-04
  Administered 2022-11-03 – 2022-11-04 (×2): 1000 ug via ORAL
  Filled 2022-11-03 (×2): qty 2

## 2022-11-03 MED ORDER — OXYCODONE-ACETAMINOPHEN 5 MG-325 MG TABLET
2.0000 | ORAL_TABLET | ORAL | Status: DC | PRN
Start: 2022-11-03 — End: 2022-11-04
  Administered 2022-11-03 – 2022-11-04 (×4): 2 via ORAL
  Filled 2022-11-03 (×4): qty 2

## 2022-11-03 MED ORDER — IBUPROFEN 600 MG TABLET
600.0000 mg | ORAL_TABLET | Freq: Four times a day (QID) | ORAL | Status: DC | PRN
Start: 2022-11-03 — End: 2022-11-04

## 2022-11-03 MED ORDER — LACTATED RINGERS INTRAVENOUS SOLUTION
1000.0000 mL | INTRAVENOUS | Status: DC
Start: 2022-11-03 — End: 2022-11-03
  Administered 2022-11-03: 1000 mL via INTRAVENOUS
  Administered 2022-11-03: 0 mL via INTRAVENOUS

## 2022-11-03 MED ORDER — DEXAMETHASONE SODIUM PHOSPHATE 4 MG/ML INJECTION SOLUTION
Freq: Once | INTRAMUSCULAR | Status: DC | PRN
Start: 2022-11-03 — End: 2022-11-03
  Administered 2022-11-03: 4 mg via INTRAVENOUS

## 2022-11-03 MED ORDER — ROPIVACAINE (PF) 2 MG/ML (0.2 %) INJECTION SOLUTION
Freq: Once | INTRAMUSCULAR | Status: DC | PRN
Start: 2022-11-03 — End: 2022-11-03
  Administered 2022-11-03: 40 mL via INTRAMUSCULAR

## 2022-11-03 MED ORDER — MELATONIN 3 MG TABLET
10.0000 mg | ORAL_TABLET | Freq: Every evening | ORAL | Status: DC
Start: 2022-11-03 — End: 2022-11-04
  Administered 2022-11-03: 10.5 mg via ORAL
  Filled 2022-11-03: qty 4

## 2022-11-03 MED ORDER — NALOXONE 1 MG/ML INJECTION SYRINGE
1.0000 mg | INJECTION | INTRAMUSCULAR | Status: DC | PRN
Start: 2022-11-03 — End: 2022-11-03

## 2022-11-03 MED ORDER — ELVITEG 150 MG-COB 150 MG-EMTRICIT 200 MG-TENOFO ALAFENAM 10 MG TABLET
1.0000 | ORAL_TABLET | Freq: Every day | ORAL | Status: DC
Start: 2022-11-03 — End: 2022-11-04
  Administered 2022-11-03 – 2022-11-04 (×2): 0 via ORAL

## 2022-11-03 MED ORDER — ACETAMINOPHEN 500 MG TABLET
1000.0000 mg | ORAL_TABLET | Freq: Once | ORAL | Status: AC
Start: 2022-11-03 — End: 2022-11-03
  Administered 2022-11-03: 1000 mg via ORAL
  Filled 2022-11-03: qty 2

## 2022-11-03 MED ORDER — INSULIN LISPRO 100 UNIT/ML SUB-Q SSIP PEN
2.0000 [IU] | INJECTION | Freq: Four times a day (QID) | SUBCUTANEOUS | Status: DC
Start: 2022-11-03 — End: 2022-11-04
  Administered 2022-11-03: 3 [IU] via SUBCUTANEOUS
  Administered 2022-11-04 (×2): 0 [IU] via SUBCUTANEOUS
  Filled 2022-11-03: qty 300

## 2022-11-03 MED ORDER — ASPIRIN 81 MG TABLET,DELAYED RELEASE
81.0000 mg | DELAYED_RELEASE_TABLET | Freq: Two times a day (BID) | ORAL | Status: DC
Start: 2022-11-04 — End: 2022-11-04
  Administered 2022-11-04: 81 mg via ORAL
  Filled 2022-11-03: qty 1

## 2022-11-03 MED ORDER — SENNOSIDES 8.6 MG-DOCUSATE SODIUM 50 MG TABLET
1.0000 | ORAL_TABLET | Freq: Every evening | ORAL | 0 refills | Status: AC
Start: 2022-11-03 — End: ?

## 2022-11-03 MED ORDER — VANCOMYCIN 1 GRAM/200 ML IN DEXTROSE 5 % INTRAVENOUS PIGGYBACK
15.0000 mg/kg | INJECTION | Freq: Once | INTRAVENOUS | Status: AC
Start: 2022-11-03 — End: 2022-11-03
  Administered 2022-11-03: 1000 mg via INTRAVENOUS
  Filled 2022-11-03: qty 200

## 2022-11-03 MED ORDER — ONDANSETRON HCL (PF) 4 MG/2 ML INJECTION SOLUTION
4.0000 mg | Freq: Four times a day (QID) | INTRAMUSCULAR | Status: DC | PRN
Start: 2022-11-03 — End: 2022-11-04

## 2022-11-03 MED ORDER — PANTOPRAZOLE 40 MG TABLET,DELAYED RELEASE
40.0000 mg | DELAYED_RELEASE_TABLET | Freq: Every day | ORAL | Status: DC
Start: 2022-11-03 — End: 2022-11-03
  Administered 2022-11-03: 40 mg via ORAL
  Filled 2022-11-03: qty 1

## 2022-11-03 MED ORDER — INSULIN REGULAR HUMAN 100 UNIT/ML INJECTION CORRECTIONAL - CCMC
1.0000 [IU] | Freq: Once | INTRAMUSCULAR | Status: DC | PRN
Start: 2022-11-03 — End: 2022-11-03

## 2022-11-03 SURGICAL SUPPLY — 85 items
AGENT HEMOSTATIC FLOSEAL MATRIX RECOTHROM 10ML (WOUND CARE SUPPLY) ×1 IMPLANT
AGENT HMST MATRIX RECOTHROM FLSL (WOUND CARE SUPPLY) IMPLANT
AIRWAY 100MM GDL PLASTIC ADULT BITE BLOCK MULTIPURP RND TIP PHRNG ORAL NONST LF  DISP RD (RESPIRATORY/AIRWAY MGMT) ×1 IMPLANT
APPL 70% ISPRP 2% CHG 26ML CHLRPRP HI-LT ORNG PREP STRL LF  DISP CLR (MED SURG SUPPLIES) ×3 IMPLANT
BANDAGE CUTICERIN 8X3IN NONADH PERM WATER RPLNT IMPRG GAUZE GAUZE (WOUND CARE SUPPLY) ×1 IMPLANT
BIT DRILL 40MM 3.3MM STRL DISP (SURGICAL CUTTING SUPPLIES) ×1 IMPLANT
BLADE SAW 3.543X.709IN 2 CUT SGTL THK.054IN STRL (SURGICAL CUTTING SUPPLIES) ×1 IMPLANT
BLANKET MISTRAL-AIR ADULT UPR BODY 79X29.9IN FRC AIR HI VOL BLWR INTUITIVE CONTROL PNL LRG LED (MED SURG SUPPLIES) ×1 IMPLANT
CANNULA NASAL 7FT ANGL FLXB LIP PLATE CRSH RS LUM TUBE FLR TIP ADULT ARLF UCIT STD CURVE LF  DISP (RESPIRATORY/AIRWAY MGMT) ×1 IMPLANT
CEMENT BONE PALACOS R+G ARTHPL GENT HVSC (CEMENT) IMPLANT
CIRCUIT BREATHING 2 LIMB 2 FILTER BRTH MASK GAS SAMPLE LINE 33-87IN ADULT 3L LF  PORTEX DISP STR WYE (RESPIRATORY/AIRWAY MGMT) ×1 IMPLANT
CONV USE 147895 - GOWN SURG 3XL STD LGTH L4 AERO CHRM CRW REG CUT HKLP STRL (DRAPE/PACKS/SHEETS/OR TOWEL) IMPLANT
CONV USE 407820 - DRAPE SHEET 77X53IN .75 PRXM LF  STRL DISP SURG SMS BLU (DRAPE/PACKS/SHEETS/OR TOWEL) ×5
COVER EQP EAZY CVR 36X28IN DISP DOOR STRL LF (DRAPE/PACKS/SHEETS/OR TOWEL) ×1 IMPLANT
COVER STAND 57X30IN MAYO XL STD FBRC REINF TLSCP FOLD FLXB STRL (DRAPE/PACKS/SHEETS/OR TOWEL) ×1 IMPLANT
CRADLE POSITION ARM LMI NONST LF (MED SURG SUPPLIES) ×1 IMPLANT
DISCONTINUED USE 44909 - STAPLER SKIN 4.8X3.4MM 35 W STPL 360D ROT HEAD STRL LF  MULTIFIRE PREM DISP SS .51MM (SUTURE/WOUND CLOSURE) ×1
DRAPE 2 INCS FILM ANTIMIC 23X17IN IOBN STRL SURG (DRAPE/PACKS/SHEETS/OR TOWEL) ×2 IMPLANT
DRAPE 2 INCS FILM ANTIMIC 33X23IN IOBN STRL SURG (DRAPE/PACKS/SHEETS/OR TOWEL) ×1 IMPLANT
DRAPE ADH 51X47IN STRDRP LF  STRL DISP SURG CLR (DRAPE/PACKS/SHEETS/OR TOWEL) ×2 IMPLANT
DRAPE CARM FLRSCP EXPD CLPSBL C-ARMOR STRL EQP (DRAPE/PACKS/SHEETS/OR TOWEL) ×1 IMPLANT
DRAPE SHEET 77X53IN .75 PRXM LF  STRL DISP SURG SMS BLU (DRAPE/PACKS/SHEETS/OR TOWEL) ×5 IMPLANT
DRAPE TEAR RST LOW LINT 76X55IN LRG STRL SURG (DRAPE/PACKS/SHEETS/OR TOWEL) ×1 IMPLANT
DRAPE U SPLT IMPRV 70X60IN STRL SURG POLY 21X4IN (DRAPE/PACKS/SHEETS/OR TOWEL) ×1 IMPLANT
DRESS ALG 10X3.5IN RECT PRIMASEAL IONIC SILVER FBR HYDROCOLLOID ADH ANTIMIC WTPRF OUTR LYR PAD LF (WOUND CARE SUPPLY) ×1 IMPLANT
DRESS ALG 6X3.5IN RECT PRIMASEAL IONIC SILVER FBR HYDROCOLLOID ADH ANTIMIC WTPRF OUTR LYR PAD 4.4X2 (WOUND CARE SUPPLY) IMPLANT
ELECTRODE ESURG BLADE 6.5IN 3/32IN EDGE STRL .2IN DISP INSL STD SHAFT XTD LF (SURGICAL CUTTING SUPPLIES) ×1 IMPLANT
ELECTRODE ESURG BLADE 6.5IN EDGE LF (SURGICAL CUTTING SUPPLIES) ×1 IMPLANT
ELECTRODE PATIENT RTN 9FT VLAB C30- LB RM PHSV ACRL FOAM CORD NONIRRITATE NONSENSITIZE ADH STRP (SURGICAL CUTTING SUPPLIES) ×1 IMPLANT
GLOVE SURG 6.5 LF  PF BEAD CUF STRL CRM 11.5MM PROTEXIS PLISPRN THK11.2 MIL (GLOVES AND ACCESSORIES) ×8 IMPLANT
GLOVE SURG 6.5 LF  PF SMOOTH BEAD CUF INTLK STRL BLU 11.3IN PROTEXIS NEU-THERA PLISPRN THK7.9 MIL (GLOVES AND ACCESSORIES) ×3 IMPLANT
GLOVE SURG 7 LF  PF SMOOTH BEAD CUF INTLK STRL BLU 11.8IN PROTEXIS NEU-THERA PLISPRN THK7.9 MIL (GLOVES AND ACCESSORIES) ×2 IMPLANT
GLOVE SURG 7.5 LF  PF BEAD CUF STRL CRM 12IN PROTEXIS PLISPRN THK11.2 MIL (GLOVES AND ACCESSORIES) ×3 IMPLANT
GLOVE SURG 7.5 LF  PF SMOOTH BEAD CUF INTLK STRL BLU 11.8IN PROTEXIS NEU-THERA PLISPRN THK7.9 MIL (GLOVES AND ACCESSORIES) ×1 IMPLANT
GOWN SURG 3XL STD LGTH L4 AERO CHRM CRW REG CUT HKLP STRL (DRAPE/PACKS/SHEETS/OR TOWEL)
HANDPC SUCT MEDIVAC YANKAUER BLBS TIP CLR STRL LF  DISP (MED SURG SUPPLIES) ×1 IMPLANT
HEAD V40 28MM -4MM OFST TAPER HIP BLX D FEM STRL (IMPLANTS HIP) ×1 IMPLANT
HOOD SRG T7PLUS PLWY FACE SHIELD STRL LF  DISP (GLOVES AND ACCESSORIES) ×4 IMPLANT
JELLY LUB MDCHC PKT STRL 3GM LF  DISP (MED SURG SUPPLIES) ×1 IMPLANT
KIT BONE CEM ACM BRKWY MX BWL_BS CART FEM NOZ 180GM STRL LF (ORTHOPEDICS (NOT IMPLANTS)) IMPLANT
KIT PLS LAV PLSVC + COM STRL LF (WOUND CARE SUPPLY) ×1 IMPLANT
LARYNGO INTUBATE 3 PREASSEMBLE BLADE STD HNDL LED LIGHT CLR CD BRITEPRO OMNI MCNTSH DISP STRL (RESPIRATORY/AIRWAY MGMT) ×1 IMPLANT
LINER ACET MDM F 46MM COCR HIP (IMPLANTS HIP) ×1 IMPLANT
MBO USE ITEM 44909 - STAPLER SKIN 4.8X3.4MM 35 W STPL 360D ROT HEAD STRL LF  MULTIFIRE PREM DISP SS .51MM (SUTURE/WOUND CLOSURE) ×1
NEEDLE FILTER 1.5IN 18GA BLUNT BVL LL HUB DEHP-FR BD STRL LF  PURP PLYCRB 5UM REG WL DISP (MED SURG SUPPLIES) ×1 IMPLANT
NEEDLE HYPO  18GA 1.5IN TW ECLIPSE POLYPROP REG BVL LL PVT SHIELD MECH DEHP-FR PNK STRL LF  DISP (MED SURG SUPPLIES) IMPLANT
NEEDLE HYPO  21GA 1.5IN TW ECLIPSE POLYPROP REG BVL LL PVT SHIELD MECH DEHP-FR GRN STRL LF  DISP (MED SURG SUPPLIES) ×2 IMPLANT
NEEDLE SUT REG .5 CRC 1.521IN .05IN TAPER PNT FREE EYE HVWR RCHRD-ALLAN MAYO SRG SS CATGUT TISS CLSR (SUTURE/WOUND CLOSURE) ×1 IMPLANT
PEN SURG MRKNG DVN SKIN DISP NONSMEAR REG TIP FLXB RLR GNTN VIOL STRL LF (MED SURG SUPPLIES) ×1 IMPLANT
PILLOW MED FOAM ABDUCT HIP POSITION (MED SURG SUPPLIES) ×1 IMPLANT
PRIMARY HIP CONSTRUCT NO 3 +X3 (IMPLANTS HIP) ×1 IMPLANT
PROBE ORAL NASAL RECTAL LFRIC SURF POS LOCK CONN SENSOR WRE FLXB THERM THN 12FR STRL LF  400 SER (MED SURG SUPPLIES) ×1 IMPLANT
PRS BONE CEM YW SM FEM CNL STRL LF DISP (ORTHOPEDICS (NOT IMPLANTS)) IMPLANT
RESTRICT CEMENT 18.5MM BUCK 15+ MM HIP FEM PLUG STRL (IMPLANTS HIP)
RETRIEVER SUT 10.1IN HWSN LGMNT DRILL GUIDE STRL LF (SUTURE AIDS) IMPLANT
SCREW BONE TRIDENT II 6.5MM 40MM LOW PROF HEX STRL (IMPLANTS TRAUMA) ×1 IMPLANT
SET ADMIN 37IN 15 GTT 4.2ML IV BAG 2ND HNGR ROT LL DEHP-FR TUBE STRL LF DISP (IV TUBING & ACCESSORIES) ×1 IMPLANT
SET ADMIN 37IN 15 GTT 4.2ML IV BAG HNGR ROT LL DEHP-FR TUBE STRL LF DISP (IV TUBING & ACCESSORIES) ×1
SHELL ACET 56MM HIP CLUST H TRIT TRIDENT II F STRL (IMPLANTS HIP) ×1 IMPLANT
SKNCLS EXOFIN FUS 22CM ADH LIQUID APPL MCBL BARRIER 2-OCTYL CYNCRLT SYSTEM STRL DISP (MED SURG SUPPLIES) IMPLANT
SOL IRRG 0.9% NACL 3L ARTHMTC LF (MEDICATIONS/SOLUTIONS) ×1 IMPLANT
SPONGE SURG SUCT ATTACHMENT BIOPREP FEM (MED SURG SUPPLIES) IMPLANT
STAPLER SKIN 4.8X3.4MM 35 W STPL 360D ROT HEAD STRL LF  MULTIFIRE PREM DISP SS .51MM (SUTURE/WOUND CLOSURE) ×1 IMPLANT
STEM FEM ACCOLADE V40 114MM PRFT PUREFIX TI HIP 132D 7 46MM STD OFST STRL (IMPLANTS HIP) ×1 IMPLANT
STRIP 4X.5IN STRSTRP PLSTR REINF SKNCLS WHT STRL LF (WOUND CARE SUPPLY) IMPLANT
STYLET INTUBATE 45CM 14FR METAL PLASTIC RU FLXSLP MLBL COR RETRACT TIP CLR CD STRL LF  DISP 7-10MM (RESPIRATORY/AIRWAY MGMT) ×1 IMPLANT
SUTURE 0 CT VICRYL 36IN UNDYED BRD COAT ABS (SUTURE/WOUND CLOSURE) IMPLANT
SUTURE 0 CT1 VICRYL 27IN UNDYED BRD COAT ABS (SUTURE/WOUND CLOSURE) ×2 IMPLANT
SUTURE 1 CT VICRYL 27IN UNDYED BRD COAT ABS (SUTURE/WOUND CLOSURE) ×2 IMPLANT
SUTURE 1 OS-6 SPRL STRATAFIX PDS + 12IN VIOL 1 STRN ABS (SUTURE/WOUND CLOSURE) IMPLANT
SUTURE 2 V-37 ETHIBOND EXC 30IN GRN BRD 4 STRN NONAB (SUTURE/WOUND CLOSURE) ×1 IMPLANT
SUTURE 2-0 CT1 VICRYL 27IN UNDYED BRD COAT ABS (SUTURE/WOUND CLOSURE) ×3 IMPLANT
SUTURE 3-0 PS2 SPRL STRATAFIX MONOCRYL + 60CM UNDYED KNOTLESS TISS CONTROL ANBCTRL ABS (SUTURE/WOUND CLOSURE) IMPLANT
SUTURE PDO 2 .5 CRC QUILL 36CM VIOL 2 ARM MONOF BARB TAPER PNT ABS 48MM (SUTURE/WOUND CLOSURE) ×1 IMPLANT
SYRINGE LL 20ML STRL GRAD MED DISP (MED SURG SUPPLIES) ×2 IMPLANT
SYRINGE LL 3ML LF  STRL GRAD N-PYRG DEHP-FR PVC FREE MED DISP CLR (MED SURG SUPPLIES) ×1 IMPLANT
TIP PLS LAV 12.7CM ML ORFC FAN SPRAY SPLSHLD HI CPC INTRAMED NAIL STRL (WOUND CARE SUPPLY) ×1 IMPLANT
TIP PLS LAV 22.86CM .89CM PLSVC + FEM BRSH RADIAL SPRAY HI CPC 1.52CM INTRAMED NAIL STRL (WOUND CARE SUPPLY) IMPLANT
TIP SUCT YANKAUER STD 5IN 1 CONN RIGID TRNSPR SLIP RST HNDL CLR STRL LF (MED SURG SUPPLIES) ×1 IMPLANT
TRAY HIP ~~LOC~~ - ~~LOC~~ CLARK MED CTR (CUSTOM TRAYS & PACK) ×1 IMPLANT
TUBE ET 7MM NASAL ORAL MRPH EYE CLOSE FIT LOW PRESS OPQ CUF SHRD CF PVC STRL LTX DISP (RESPIRATORY/AIRWAY MGMT) ×1 IMPLANT
TUBING SUCT CLR 20FT .25IN MEDIVAC MXGR RIGID MALE TO MALE CONN STRL LF  DISP (MED SURG SUPPLIES) ×1 IMPLANT
WATER STRL 1000ML PLASTIC PR BTL LF (MED SURG SUPPLIES) IMPLANT
WOUND IRRG IRRISEPT DBRD CLNSG 0.05% CHG SYSTEM STRL LF (WOUND CARE SUPPLY) ×1 IMPLANT
restoration AMD/MDM/ X3 INSERT ×1 IMPLANT

## 2022-11-03 NOTE — Care Plan (Signed)
[facility]  Rehabilitation Services  Physical Therapy Initial Evaluation    Patient Name: [name]  Date of Birth: 05/21/1945  Height: Height: 177.8 cm (5\' 10" )  Weight: Weight: 72.8 kg (160 lb 9.6 oz)  Room/Bed: 406/A  Payor: [employer] / Plan: [place] [employer] / Product Type: Managed Care /     Assessment:      77 yr old male POD 0 elective left THA, posterior approach with planned overnight admission, presents with posterior hip precautions, decreased funcitonal independence and activity tolerance, will benefit from acute care and out patient PT services    Discharge Needs:    Equipment Recommendation: none anticipated      The patient presents with mobility limitations due to impaired range of motion, impaired strength, impaired functional activity tolerance, and posterior hip precautions  that significantly impair/prevent patient's ability to participate in mobility-related activities of daily living (MRADLs) including  ambulation and transfers in order to safely complete, toileting, safely entering/exiting the home.   Discharge Disposition: home with assist, home with outpatient services    JUSTIFICATION OF DISCHARGE RECOMMENDATION   Based on current diagnosis, functional performance prior to admission, and current functional performance, this patient requires continued PT services in home with assist, home with outpatient services in order to achieve significant functional improvements in these deficit areas: aerobic capacity/endurance, gait, locomotion, and balance.  Plan:   Current Intervention: bed mobility training, gait training, home exercise program, patient/family education, transfer training  To provide physical therapy services minimum of 1x/week  for duration of until discharge.    The risks/benefits of therapy have been discussed with the patient/caregiver and he/she is in agreement with the established plan of care.       Subjective & Objective         11/03/22 1549   Rehab Session   Document Type evaluation;therapy progress note (daily note)   Total PT Minutes: 23  (E+13)   Patient Effort good   Symptoms Noted During/After Treatment dizziness   General Information   Patient Profile Reviewed yes   Onset of Illness/Injury or Date of Surgery 11/03/22   Referring Physician Ryan   Patient/Family/Caregiver Comments/Observations Elderly male supine in bed in 406, on room air, wearing glasses, wedge in place between legs, spouse present   Pertinent History of Current Functional Problem 77 yr old male with extensive PMH, POD 0 elective left THA, posterior approach   Medical Lines PIV Line   Existing Precautions/Restrictions fall precautions;anterior hip precaution   Weight-bearing Status   Extremity Precautions left lower extremity   Left Lower Extremity weight-bearing as tolerated (WBAT)   Mutuality/Individual Preferences   Individualized Care Needs call me Sherrill Raring   Plan of Care Reviewed With patient;spouse   Living Environment   Lives With spouse   Living Arrangements house   Home Accessibility ramps present at home   Transportation Available family or friend will provide   Living Environment Comment Active, no DME used prior to admission   Functional Level Prior   Ambulation 0 - independent   Transferring 0 - independent   Toileting 0 - independent   Prior Functional Level Comment Has walker, elevated toilet, no steps within, no DME used PTA   Cognitive Assessment/Interventions   Behavior/Mood Observations alert;cooperative   Orientation Status oriented x 3   Attention WNL/WFL   Follows Commands WFL   Vital Signs   Vitals Comment VSS   Pain Assessment   Pain Intervention  Ice  Pretreatment Pain Rating 4/10   Posttreatment Pain Rating 4/10   Pain Location - Side Left   Pain Location hip   Pre/Posttreatment Pain Comment ice pack applied   RLE Assessment   RLE Assessment WFL for stated baseline   LLE Assessment   LLE Assessment   (ROM limited by posterior hip  precautions, unable to SLR)   Bed Mobility Assessment/Treatment   Bed Mobility, Assistive Device Head of Bed Elevated   Supine-Sit Independence minimum assist (75% patient effort)  (LE's)   Safety Issues decreased use of legs for bridging/pushing   Impairments pain   Transfer Assessment/Treatment   Sit-Stand Independence contact guard assist;verbal cues required   Stand-Sit Independence contact guard assist;verbal cues required   Sit-Stand-Sit, Assist Device walker, front wheeled   Transfer Comment toileting completed with OT   Gait Assessment/Treatment   Independence  contact guard assist;supervision required;1 person + 1 person to manage equipment   Assistive Device  walker, front wheeled   Distance in Feet 15 ft 36ft   Comment Pt ambulated on level with WW and CGA, slow candence, step to gait, 90-180 deg direction changes, no LOB   Therapeutic Exercise/Activity   Comment Pt and spouse educated on role of skilled PT and POC as well as hip precautions and wedge use   Orthotics/Misc Device    Hip Abducter On   Post Treatment Status   Post Treatment Patient Status Patient sitting in bedside chair or w/c;Call light within reach;Patient safety alarm activated  (reclined, posey, ice left hip, abduction wedge)   Support Present Post Treatment  Family present   Film/video editor Nurse   Communication Post Treatment Comment pt performance   Plan of Care Review   Plan Of Care Reviewed With patient;spouse   Basic Mobility Am-PAC/6Clicks Score (APPROVED Staff)   Turning in bed without bedrails 3   Lying on back to sitting on edge of flat bed 3   Moving to and from a bed to a chair 3   Standing up from chair 3   Walk in room 3   Climbing 3-5 steps with railing 3   6 Clicks Raw Score total 18   Standardized (t-scale) score 41.05   Physical Therapy Clinical Impression   Assessment 77 yr old male POD 0 elective left THA, posterior approach with planned overnight admission, presents with posterior hip precautions,  decreased funcitonal independence and activity tolerance, will benefit from acute care and out patient PT services   Criteria for Skilled Therapeutic yes   Pathology/Pathophysiology Noted musculoskeletal   Impairments Found (describe specific impairments) aerobic capacity/endurance;gait, locomotion, and balance   Functional Limitations in Following  self-care;home management   Rehab Potential good   Therapy Frequency minimum of 1x/week   Predicted Duration of Therapy Intervention (days/wks) until discharge   Anticipated Equipment Needs at Discharge (PT) none anticipated   Anticipated Discharge Disposition home with assist;home with outpatient services   Evaluation Complexity Justification   Patient History: Co-morbidity/factors that impact Plan of Care Surgical procedure: causing pain &/or impaired function;One or more other medical co-morbidity   Examination Components Balance;Bed mobility;Transfers;Ambulation   Presentation Stable: Uncomplicated, straight-forward, problem focused   Clinical Decision Making Low complexity   Evaluation Complexity Low complexity   Care Plan Goals   PT Rehab Goals Physical Therapy Goal;Physical Therapy Goal 2;Bed Mobility Goal;Gait Training Goal;Transfer Training Goal   Physical Therapy Goal   PT  Goal, Date Established 11/03/22   PT Goal, Time to Achieve by discharge  PT Goal, Activity Type Pt will participate in LLE ther ex in group or individual setting to promote circulation, ROM And strength   Physical Therapy Goal 2   PT Goal, Date Established 11/03/22   PT Goal, Time to Achieve by discharge   PT Goal, Activity Type Pt will recall and obey hip precautions during functional activity   Bed Mobility Goal   Bed Mobility Goal, Date Established 11/03/22   Bed Mobility Goal, Time to Achieve by discharge   Bed Mobility Goal, Activity Type supine to sit/sit to supine   Bed Mobility Goal, Independence Level stand-by assistance   Bed Mobility Goal, Assistive Device least restrictive  assistive device   Gait Training  Goal, Distance to Achieve   Gait Training  Goal, Date Established 11/03/22   Gait Training  Goal, Time to Achieve by discharge   Gait Training  Goal, Independence Level stand-by assistance   Gait Training  Goal, Assist Device walker, rolling   Gait Training  Goal, Distance to Achieve >/ =100 ft   Gait Training  Goal, Additional Goal 90-180 deg direction changes, level incline and decline, no LOB   Transfer Training Goal   Transfer Training Goal, Date Established 11/03/22   Transfer Training Goal, Time to Achieve by discharge   Transfer Training Goal, Activity Type bed-to-chair/chair-to-bed;sit-to-stand/stand-to-sit   Transfer Training Goal, Independence Level stand-by assistance   Transfer Training Goal, Assist Device walker, rolling   Planned Therapy Interventions, PT Eval   Planned Therapy Interventions (PT) bed mobility training;gait training;home exercise program;patient/family education;transfer training   Psychosocial Support   Trust Relationship/Rapport care explained;questions answered   Patient identified by name and date of birth.  PT OT co eval and treatment completed to promote safe and efficient mobilization POD 0.  PT eval completed from [phone] and Gait training from [phone].    Therapist:   Maxie Barb, MPT [place] [phone]    Start Time: 1525  End Time: 1549  Evaluation Time: 10  minutes  Charges Entered: eval., Gait training 13 min   Department Number: [id number]

## 2022-11-03 NOTE — Care Plan (Signed)
Problem: Adult Inpatient Plan of Care  Goal: Plan of Care Review  Outcome: Ongoing (see interventions/notes)  Goal: Patient-Specific Goal (Individualized)  Outcome: Ongoing (see interventions/notes)  Goal: Absence of Hospital-Acquired Illness or Injury  Outcome: Ongoing (see interventions/notes)  Goal: Optimal Comfort and Wellbeing  Outcome: Ongoing (see interventions/notes)  Goal: Rounds/Family Conference  Outcome: Ongoing (see interventions/notes)     Problem: Pain Acute  Goal: Optimal Pain Control and Function  Outcome: Ongoing (see interventions/notes)     Problem: Fall Injury Risk  Goal: Absence of Fall and Fall-Related Injury  Outcome: Ongoing (see interventions/notes)     Problem: Infection  Goal: Absence of Infection Signs and Symptoms  Outcome: Ongoing (see interventions/notes)     Problem: Hip Arthroplasty  Goal: Optimal Coping  Outcome: Ongoing (see interventions/notes)  Goal: Absence of Bleeding  Outcome: Ongoing (see interventions/notes)  Goal: Effective Bowel Elimination  Outcome: Ongoing (see interventions/notes)  Goal: Fluid and Electrolyte Balance  Outcome: Ongoing (see interventions/notes)  Goal: Optimal Functional Ability  Outcome: Ongoing (see interventions/notes)  Goal: Absence of Infection Signs and Symptoms  Outcome: Ongoing (see interventions/notes)  Goal: Intact Neurovascular Status  Outcome: Ongoing (see interventions/notes)  Goal: Anesthesia/Sedation Recovery  Outcome: Ongoing (see interventions/notes)  Goal: Acceptable Pain Control  Outcome: Ongoing (see interventions/notes)  Goal: Nausea and Vomiting Relief  Outcome: Ongoing (see interventions/notes)  Goal: Effective Urinary Elimination  Outcome: Ongoing (see interventions/notes)  Goal: Effective Oxygenation and Ventilation  Outcome: Ongoing (see interventions/notes)

## 2022-11-03 NOTE — Consults (Signed)
[place] [facility] -[facility]                                     INPATIENT CONSULT NOTE      Desmon, Vicknair, 77 y.o. male  Date of Admission:  11/03/2022  Date of service: 11/03/2022  Date of Birth:  1945-05-06    Hospital Day:  LOS: 1 day   Patient status during this hospitalization is Outpatient.    History:  Patient is a 77 year old male just out of surgery after a total left hip arthroplasty as per Dr. Alycia Rossetti.  Patient did well in surgery as well as in the recovery room in his transfer to the floor.  Pains only a 4/10 he denies any chest pain shortness a breath palpitations fever chills etc..  No nausea no vomiting.  He is doing very well postoperatively in his vital signs are stable.  Medical history is possible for HIV currently on HAART therapy good counts.  Prior history of a pulmonary embolus after previous orthopedic surgery.  Iron-deficiency anemia followed by Dr. Debby Bud who has recommended the patient remain on Eliquis 5 mg b.i.d. for 6 weeks to 3 months postoperatively.  Previous upper GI bleed requiring blood transfusions in [place] when he was anticoagulated in the past.  Prior pancreatectomy for benign pancreatic mass in 2023.  Diabetes mellitus type 2 on insulin as per endocrinology.  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  Dr. Leticia Clas seeing him he currently is taking Lantus 5 units daily along with glipizide 7.5 mg b.i.d. and as of 10/17/2022 his hemoglobin A1c is 7.4.  His GFR is running at best just a little bit over 60 mL/minute.    Vital Signs:  Temperature: 36.5 C (97.7 F)  Heart Rate: 90  BP (Non-Invasive): 129/74  Respiratory Rate: 16  SpO2: 99 %  Liter Flow (L/Min): 2    Intake & Output:    Intake/Output Summary (Last 24 hours) at 11/03/2022 1856  Last data filed at 11/03/2022 1800  Gross per 24 hour   Intake 1158.4 ml   Output 450 ml   Net 708.4  ml     I/O current shift:  10/03 0700 - 10/03 1859  In: 1158.4 [I.V.:800]  Out: 450 [Urine:350; Blood:100]    Physical exam:  General:   alert, oriented  x3 and in no acute distress  HEENT:  atraumatic, pupils equally round and reactive.  Nose and throat        unremarkable.  Neck:       supple , without thyromegally on lymphadenopathy.  Lungs:     respirations unlabored.  No rales, rhonchi or wheezes.  Heart:      regular rate and rhythm without gallops, rubs or murmurs.  Abdomen:  normoactive bowel sounds.  No tenderness.  No hepatosplenomegally or masses.   Back:       unremarkable.  No tenderness, deformity.  No CVAT  Genitalia:  deferred  Extremities:  no cyanosis, clubbing or edema.  Neuro:     cranial nerves intact; all gross motor, sensory and cerebellar function intact.  Psychiatric:  mood and affect normal. At baseline.       Current Medications:  aluminum-magnesium hydroxide-simethicone (MAG-AL PLUS) 200-200-20 mg per 5 mL oral liquid, 30 mL, Oral, Q4H PRN  [START ON 11/04/2022] amLODIPine (NORVASC) tablet,  10 mg, Oral, Daily  [START ON 11/04/2022] aspirin (ECOTRIN) enteric coated tablet 81 mg, 81 mg, Oral, 2x/day  atorvastatin (LIPITOR) tablet, 10 mg, Oral, QPM  ceFAZolin (ANCEF) 1 g in iso-osmotic 50 mL premix IVPB, 1 g, Intravenous, Q8H  cyanocobalamin (VITAMIN B12) tablet, 1,000 mcg, Oral, QAM  docusate sodium (COLACE) capsule, 100 mg, Oral, 2x/day  elvitegravir-cobicistat-emtricitrabine-tenofovir (GENVOYA) 150-150-200-10 mg per tablet, 1 Tablet, Oral, Daily  ferrous sulfate 325 mg (elemental IRON 65 mg) tablet, 325 mg, Oral, Daily  gabapentin (NEURONTIN) capsule, 300 mg, Oral, 3x/day  ibuprofen (MOTRIN) tablet, 600 mg, Oral, Q6H PRN  [START ON 11/04/2022] levothyroxine (SYNTHROID) tablet, 75 mcg, Oral, QAM  LR premix infusion, , Intravenous, Continuous  LR premix infusion, , Intravenous, Continuous  magnesium hydroxide (MILK OF MAGNESIA) 400mg  per 5mL oral liquid, 30 mL, Oral, Q12H PRN  melatonin  tablet, 10.5 mg, Oral, NIGHTLY  multivitamin (THERA) tablet, 1 Tablet, Oral, Daily  naloxone (NARCAN) 0.4 mg/mL injection, 0.4 mg, Intravenous, Q2 MIN PRN  naloxone (NARCAN) 1 mg/mL injection, 1 mg, Intravenous, Q2 MIN PRN  NS flush syringe, 3 mL, Intracatheter, Q8HRS  NS flush syringe, 3 mL, Intracatheter, Q1H PRN  ondansetron (ZOFRAN) 2 mg/mL injection, 4 mg, Intravenous, Q6H PRN  oxyCODONE-acetaminophen (PERCOCET) 5-325mg  per tablet, 1 Tablet, Oral, Q4H PRN  oxyCODONE-acetaminophen (PERCOCET) 5-325mg  per tablet, 2 Tablet, Oral, Q4H PRN  pantoprazole (PROTONIX) delayed release tablet, 40 mg, Oral, Daily  simethicone (MYLICON) chewable tablet, 80 mg, Oral, Q8H PRN  Vancomycin IV - Pharmacist to Dose per Protocol, , Does not apply, Once PRN        No Known Allergies    Labs:  I have reviewed all lab results.    Labs:         CBC Results Differential Results   No results for input(s): "WBC", "HGB", "HCT", "BANDS" in the last 72 hours.    Invalid input(s): "PLATELETCOUNT" No results for input(s): "PMNS", "LYMPHOCYTES", "MONOCYTES", "EOSINOPHIL", "BASOPHILS", "NRBCS", "PMNABS", "LYMPHSABS", "MONOSABS", "EOSABS", "BASOSABS" in the last 72 hours.   BMP Results Other Chemistries Results   No results for input(s): "SODIUM", "POTASSIUM", "CHLORIDE", "CO2", "BUN", "CREATININE", "GFR", "ANIONGAP" in the last 72 hours. No results for input(s): "CALCIUM", "ALBUMIN", "MAGNESIUM", "PHOSPHORUS" in the last 72 hours.   Liver/Pancreas Enzyme Results Blood Gas     No results for input(s): "TOTALPROTEIN", "ALBUMIN", "PREALBUMIN", "AST", "ALT", "ALKPHOS", "LDH", "AMYLASE", "LIPASE" in the last 72 hours.    Invalid input(s): "GGT" No results found for this encounter   Cardiac Results    Coags Results     TROPONIN I  Lab Results   Component Value Date    TROPONINI 8 06/21/2021    TROPONINI <7 04/09/2021    TROPONINI 13 04/04/2021         No results for input(s): "INR" in the last 72 hours.    Invalid input(s): "PTT", "PT"     No results  found for any visits on 11/03/22 (from the past 96 hour(s)).    Radiology Results:  FLUORO C ARM IN OR < 60 MINS   Final Result   Radiation dose/Air Kerma: 3.2 mGy      4 fluoroscopic matrix views are obtained.      Fluoroscopic images obtained submitted during left hip arthroplasty. No acute fracture.         Radiologist location ID: [id number]         XR PELVIS   Final Result   FINDINGS/IMPRESSION:   Status post interval left total hip arthroplasty  with expected soft tissue air/stranding about the left hip.   No acute fracture.         Radiologist location ID: [id number]               Current Diet Order:  DIET CLEAR LIQUID    Prophylaxis:    DVT/PE  Adressed as Appropriate    Anticoagulants (last 24 hours)       None          MY ORDERS LAST 24 (24h ago, onward)      None              Assessment   Primary osteoarthritis of the left hip   (Now) status post left hip THA   HIV infection   History of prior pulmonary embolism/dvt (postop previous orthopedic surgery)   Gastroesophageal reflux disease with previous history of upper GI bleed   Status post pancreatectomy (benign)   Status post splenectomy   Chronic kidney disease stage 2   Iron-deficiency anemia   Hypertension   Hyperlipidemia   Hypothyroidism   Osteoporosis  History of prostate cancer   History of MRSA infection         Plan:  Patient doing well immediately postop left total hip arthroplasty.   He has had a previous deep vein thrombosis and PE after having a previous orthopedic surgery and has a history of iron-deficiency under the care of Dr. Debby Bud.  Patient reports Dr. Debby Bud told him he should be on 6 weeks to 3 months of full anticoagulation with Eliquis.  He does have previous history of upper GI bleed requiring blood transfusion at [facility] and I have started him on Protonix.  Will check H&H in the morning along with a BMP.  Overall tonight he is doing well.  He has a so has HIV and he remains on specific HA RT therapy and on all his usual home  meds for his other medical problems which all seem to be stable at this time.  Would also include his diabetes blood sugars will be monitored covered with sliding scale.    ipsj    Lionel December, MD

## 2022-11-03 NOTE — OR PostOp (Signed)
1201-[name] arrived in PACU   Respirations even and unlabored  Lips and nailbeds pink  Skin warm, dry to touch  IV infusing well  See flowsheet  Patient connected to PACU monitor  Cardiac Strip reviewed and placed in chart  Safety measures in place  Side rails up, bed in low position  At bedside with patient  Warm blankets applied to patient for comfort  Connected to 02 at 3 liters per NC, he is awake, and is in pain  Noted to have an Adhesive dressing to left hip area, it is clean, dry, and intact, he has an Abductor pillow in place He has good Dorsalis pedis pulses and good Capillary refill noted, and he can move his foot up and down without difficulty. He has SCD's on also.      1205- X-rays completed of left hip completed at this time.    1208- Medicated with Dilaudid 0.5 mg IVP slowly for c/o of post op pain 9/10, he appears to be in a lot of pain,  FSBS- 209- covered with 4 units of Regular Insulin at this time, he is moaning a lot.    1218- Medicated again with Dilaudid 0.5 mg Ivp slowly for c/o of post op pain 8/10, he is still moaning    1230-Medicated again with Dilaudid 0.5 mg IVP slowly for c/o of post op pain 8/10, still moaning in pain    1240- Called Dr. Alfonso Patten, new order for Toradol 30 mg IVP slowly for c/o of post op pain, still moaning some.    1300- Medicate again with Dilaudid 0.5 mg IVP slowly for c/o of pain 7/10    1315- Patient resting more quietly at this time, states pain is now 5/10    1330-Resting quietly with eyes closed, states pain is better    1345-Patient  awake  Respirations even and unlabored  IV patent with no redness drainage, or edema at site  No complaints of Nausea  VSS stable, preparing for discharge  Skin warm and dry  Pain- 5/10  Noted to have an Adhesive dressing to left hip area, it is clean, dry, and intact, he has an Abductor pillow in place He has good Dorsalis pedis pulses and good Capillary refill noted, and he can move his foot up and down without  difficulty. He has SCD's on also. Report called to [facility] on [name] for Room 406.    1350- Patient voided 300 cc of clear yellow urine via urinal, tolerated well.    1407-Transferred per Transport staff to Room 406  Via cart , with siderails up, and 02 @ 2 liters per NC

## 2022-11-03 NOTE — OR Surgeon (Signed)
[facility]  Orthopaedic Operative Report    Patient Name: [name]  MRN: [id number]  Date: 11/03/2022  Birth: August 30, 1945    All elements must be documented.    Pre-Operative Diagnosis:  Left hip osteoarthritis and AVN  Post-Operative Diagnosis: same  Procedure(s)/Description:  Procedure(s):    Left total hip arthroplasty    Surgeon: Gray Bernhardt, MD  Assistant(s): Burlene Arnt Lenington PA-C - please note the assistance was necessary as there are no available qualified residents for positioning opening closure retraction    Implants:  [employer]ker tritanium 2 cup size 561 screw dual mobility, size 7 accolade 2 stem standard -4 Biolox    Specimens:  None  Anesthesia Type: General     Estimated Blood Loss:  75 cc      Complications (not routinely expected or not inherent to difficulty/nature of procedure): None     Findings:  AVN of the femoral head    Indications for procedure:  This is a 77 year old male with longstanding left hip pain.  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  He had failed nonoperative treatment.  Risks benefits and alternatives of surgery were explained the patient.  Risks include were not limited to leg-length discrepancy, dislocation, infection, pain, damage to local structures, anesthesia risk, bleeding, thrombosis, etc..  Patient accepts these risks and wants to move forward with the procedure.     Description of the Procedure:  Patient was met in the preoperative holding area.  Left lower extremity was marked as the operative site.  Consent was reviewed.  Patient was taken back to the OR and underwent general anesthesia without acute complications.  Preoperative Ancef and vancomycin and TXA were given.  Patient was placed in lateral decubitus position.  All bony prominences well padded.  Left lower extremity was then prepped and draped in normal sterile fashion.  Standard time-out was performed indicating the correct  patient procedure and side.  Posterior approach to the hip was utilized.  IT band was incised.  Piriformis tendon was identified tenotomized and tagged.  Posterior capsule along with the external rotators were taken down as a single sleeve.  Hip was dislocated.  Femoral neck cut was made a fingerbreadth proximal to the lesser trochanter.  Acetabulum was exposed.  Labrum was removed.  I then sequentially reamed up to a size 56.  A 56 cup was then placed.  This was checked on fluoroscopy to shown to be in the correct abduction and version.  One screw was placed up the inner table.  Dual mobility liner was then impacted.  Femur was then exposed.  I then broached up to a size 7 stem.  I trialed with a standard-4.  Leg lengths clinically felt equal.  Hip was taken through range of motion was found to be stable.  He was able to flex him up to 90 abduct him internally rotate to around 78 before dislocating.  Trial components were removed.  Wound was irrigated out.  0.2% ropivacaine was injected in the soft tissues for local anesthesia.  Final components were then placed.  Hip was reduced.  Clinically leg lengths again felt equal and the hip was stable.  Final fluoro films were obtained which showed safe hardware placement.  Wound was then irrigated out with IrriSept and copious amounts sterile saline.  1 g of vancomycin and 10 of FloSeal was placed deep within the wound.  Posterior capsule along with the piriformis tendon was  tied into the abductor tendon to create a sling.  Layered closure with 0 Vicryl 2-0 Vicryl and staples.  Soft dressings were applied.  Patient was placed supine on hospital bed.  He was placed in a hip abduction pillow.  He was woken up from anesthesia without acute complications.  He was taken to PACU in stable condition.    Post operative plan:  Patient will be admitted.  He was weightbear as tolerated on his left lower extremity.  Posterior hip precautions.  Hip abduction pillow for 24 hours then  while in bed.  He will be on aspirin 81 mg b.i.d. for deep vein thrombosis prophylaxis.  Medicine consult.  Likely discharge home tomorrow.  He will get physical therapy and occupational therapy    Gray Bernhardt, MD  Department of Orthopaedics

## 2022-11-03 NOTE — Anesthesia Postprocedure Evaluation (Signed)
Anesthesia Post Op Evaluation    Patient: [name]  Procedure(s):  ARTHROPLASTY HIP TOTAL    Last Vitals:Temperature: 36.4 C (97.5 F) (11/03/22 1201)  Heart Rate: 90 (11/03/22 1201)  BP (Non-Invasive): 119/76 (11/03/22 1201)  Respiratory Rate: 16 (11/03/22 1201)  SpO2: 91 % (11/03/22 1201)    No notable events documented.    Patient is sufficiently recovered from the effects of anesthesia to participate in the evaluation and has returned to their pre-procedure level.  Patient location during evaluation: PACU       Patient participation: complete - patient participated  Level of consciousness: awake and alert and responsive to verbal stimuli    Pain management: adequate  Airway patency: patent    Anesthetic complications: no  Cardiovascular status: acceptable  Respiratory status: acceptable  Hydration status: acceptable  Patient post-procedure temperature: Pt Normothermic   PONV Status: Absent

## 2022-11-03 NOTE — Progress Notes (Signed)
[facility]    Orthopaedic Surgery - Post-op Note    Patient: [name]  DOB: [birth year]  MRN: [id number]  Date: 11/03/2022    Day of Surgery S/P Left total hip arthroplasty    Subjective   Patient stable in PACU, pain controlled    Objective     Filed Vitals:    11/03/22 0723 11/03/22 1201   BP: 119/74 119/76   Pulse: 79 90   Resp: 16 16   Temp: 36.8 C (98.2 F) 36.4 C (97.5 F)   SpO2: 96% 91%       Gen: NAD  Lungs: non-labored breathing  CV: pulses regular rate  Abd: non-distended  Msk:  LLE: dressing / abduction pillow in place, c/d/i, cap refill <2s, wiggles all toes, SILT in all toes, plantar and dorsal foot, + DF/PF ankle    Assessment   77 y.o. male Day of Surgery s/p Left total hip arthroplasty    Plan     Admit to ortho  Medicine consult for medical comanagement  Posterior hip precautions in place  Hip abduction pillow in place for 24 hours then while in bed  Keep dressing/abduction pillow C/D/I  Weightbearing status: WBAT to LLE   DVT Prophylaxis: ASA 81 mg BID for 30 days post-operatively - to be started tomorrow (10/4).  ABX: 24 hours post op Ancef  PT/OT: OOB  Diet: Okay per ortho  Dispo: Pending patient progress, pain control, PT/OT. Likely discharge home tomorrow (10/4). F/U in 2 weeks for wound check outpatient.     Tarl Cephas, PA-C    This note was partially generated using MModal Fluency Direct system, and there may be some incorrect words, spellings, and punctuation that were not noted in checking the note before saving

## 2022-11-03 NOTE — H&P (Signed)
[facility]  Department of Orthopaedics  H&P Update Form  Date of Service: 11/03/2022      Patient: [name]  MRN: [id number]  DOB: 08/29/1945    Procedure: left THA    Pre-Surgical H&P updated the day of the procedure.  1.  The patient has been examined and no change has occured in the patients condition since the H&P was completed on 10/17/22 by Alycia Rossetti MD.     Assessment: The patient continues to be an appropiate candidate for planned surgical procedure.    Gray Bernhardt, MD  Department of Orthopaedics

## 2022-11-03 NOTE — Care Plan (Signed)
[facility]  Rehabilitation Services  Occupational Therapy Initial Evaluation    Patient Name: [name]  Date of Birth: 1945/07/16  Height: Height: 177.8 cm (5\' 10" )  Weight: Weight: 72.8 kg (160 lb 9.6 oz)  Room/Bed: 406/A  Payor: [facility] [employer] [employer] / Plan: [place] [facility]/[employer] / Product Type: Managed Care /     Assessment:   (P) Patient is POD 0 elective L THA with posterior hip precautions and increased surigcal pain impairing patient's ability to safely and independently complete ADLs and mobility.      Discharge Needs:   Equipment Recommendation: (P) none anticipated    Discharge Disposition: (P) home with assist, home with outpatient services    JUSTIFICATION OF DISCHARGE RECOMMENDATION   Based on current diagnosis, functional performance prior to admission, and current functional performance, this patient requires continued OT services in (P) home with assist, home with outpatient services  in order to achieve significant functional improvements.    Plan:   Current Intervention: (P) ADL retraining, balance training, bed mobility training, endurance training, therapeutic exercise    To provide Occupational therapy services (P) minimum of 1x/week, (P) until discharge.       The risks/benefits of therapy have been discussed with the patient/caregiver and he/she is in agreement with the established plan of care.       Subjective & Objective        11/03/22 1509   Therapist Pager   OT Assigned/ Pager # *SB 10/3   Rehab Session   Document Type evaluation   Total OT Minutes: 15   Patient Effort good   Symptoms Noted During/After Treatment increased pain   General Information   Patient Profile Reviewed yes   Patient/Family/Caregiver Comments/Observations Patient cleared for therapy, resting in bed upon entering room with spouse at bedside. Co-eval with PT.   Pertinent History of Current Functional Problem POD 0 elective L THA, posterior approach   Medical Lines PIV Line   Existing  Precautions/Restrictions fall precautions;posterior hip precautions   Mutuality/Individual Preferences   Individualized Care Needs goes by [employer]   Lives With spouse   Living Arrangements house   Transportation Available family or friend will provide   Living Environment Comment Patient lives in a single level home with ramp to enter. Patient has a safe step shower with chair and elevated toilet seat.   Functional Level Prior   Ambulation 0 - independent   Transferring 0 - independent   Toileting 0 - independent   Bathing 0 - independent   Dressing 0 - independent   Eating 0 - independent   Pain Assessment   Additional Documentation Pain Scale: Numbers Pre/Post-Treatment (Group)   Pain Assessment   Pain Intervention  Ice   Pretreatment Pain Rating 4/10   Posttreatment Pain Rating 4/10   Pre/Posttreatment Pain Comment ice pack in place   Pain Scale: Numbers Pre/Post-Treatment   Pain Location - Side/Orientation Left   Pain Location - hip   Coping/Psychosocial Response Interventions   Plan Of Care Reviewed With patient;spouse   Cognitive Assessment/Interventions   Behavior/Mood Observations behavior appropriate to situation, WNL/WFL   Orientation Status oriented x 3   Attention WNL/WFL   Follows Commands WNL   RUE Assessment   RUE Assessment WFL- Within Functional Limits   LUE Assessment   LUE Assessment WFL- Within Functional Limits   Musculoskeletal   LLE Weight-Bearing Status weight-bearing as tolerated   Bed Mobility Assessment/Treatment   Supine-Sit Independence  minimum assist (75% patient effort)   Safety Issues decreased use of legs for bridging/pushing   Transfer Assessment/Treatment   Sit-Stand Independence contact guard assist   Stand-Sit Independence contact guard assist   Sit-Stand-Sit, Assist Device walker, front wheeled   Toilet Transfer Independence contact guard assist   Toilet Transfer Assist Device walker, front wheeled   Transfer Comment Patient completed mobility in room to and  from bathroom with CGA using FWW with verbal cues for safety and sequencing with walker.   Bathing Assessment/Training   Independence Level moderate assist (50% patient effort)   Upper Body Dressing Assessment/Training   Independence Level  set up required   Lower Body Dressing Assessment/Training   Independence Level  moderate assist (50% patient effort)   Toileting Assessment/Training   Independence Level  minimum assist (75% patient effort)   Comment assist with clothing management   Grooming/Oral Hygeine  Assessment/Training   Independence Level set up required   Self-Feeding Assessment/Training   Independence Level set up required   Therapeutic Exercise/Activity   Comment Patient and spouse educated on posterior hip precautions for ADLs and mobility.   Orthotics/Misc Device    Hip Abducter On   Post Treatment Status   Post Treatment Patient Status Call light within reach;Patient sitting in bedside chair or w/c;Telephone within reach;Patient safety alarm activated   Support Present Post Treatment  Family present   Communication Post Treatement Nurse   Care Plan Goals   OT Rehab Goals Occupational Therapy Goal;Occupational Therapy Goal 2;Transfer Training Goal   Occupational Therapy Goals   OT Goal, Date Established 11/03/22   OT Goal, Time to Achieve by discharge   OT Goal, Activity Type LB BADLs   OT Goal, Independence Level set up required   OT Goal, Adaptive Equipment using DME/AD/AE as needed   OT Goal, Additional Goal following hip precautions   Occupational Therapy Goal 2   OT Goal, Date Established 11/03/22   OT Goal, Time to Achieve by discharge   OT Goal, Additional Goal Patient will tolerate 5+ minutes of standing tolerance/activity with no LOB and minimal rest breaks to improve IND with ADLs.   Transfer Training Goal   Transfer Training Goal, Date Established 11/03/22   Transfer Training Goal, Time to Achieve by discharge   Transfer Training Goal, Activity Type all transfers   Transfer Training Goal,  Independence Level stand-by assistance   Transfer Training Goal, Assist Device walker, rolling   Transfer Training Goal, Additional Goal following hip precautions   Planned Therapy Interventions, OT Eval   Planned Therapy Interventions ADL retraining;balance training;bed mobility training;endurance training;therapeutic exercise   Clinical Impression   Functional Level at Time of Session Patient is POD 0 elective L THA with posterior hip precautions and increased surigcal pain impairing patient's ability to safely and independently complete ADLs and mobility.   Criteria for Skilled Therapeutic Interventions Met (OT) yes   Rehab Potential good   Therapy Frequency minimum of 1x/week   Predicted Duration of Therapy until discharge   Anticipated Equipment Needs at Discharge none anticipated   Anticipated Discharge Disposition home with assist;home with outpatient services   Daily Activity AM-PAC/6-clicks Score   Putting on/Taking off clothing on lower body 2   Bathing 2   Toileting 3   Putting on/Taking off clothing on upper body 4   Personal grooming 4   Eating Meals 4   Raw Score Total 19   Standardized (t-scale) Score 40.22   CMS 0-100% Score 42.8   CMS Modifier  CK       Therapist:   Bridgette Habermann, MOTR/L     Start Time: 1509  End Time: 1524  Evaluation Time: 15 minutes  Charges Entered: OTE x 1   Department Number: [id number]

## 2022-11-03 NOTE — Anesthesia Transfer of Care (Signed)
ANESTHESIA TRANSFER OF CARE   [name] is a 77 y.o. ,male, Weight: 72.8 kg (160 lb 9.6 oz)   had Procedure(s):  ARTHROPLASTY HIP TOTAL  performed  11/03/22   Primary Service: Gray Bernhardt, MD    Past Medical History:   Diagnosis Date    C. 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)     Hx of transfusion     Hyperlipidemia     "borderline"    Hypothyroidism     MRSA (methicillin resistant staph aureus) culture positive 01/02/2021    MRSA left groin abscess 01/03/21    MRSA (methicillin resistant staph aureus) culture positive 01/02/2021    MRSA blood 01/02/21    Pulmonary embolism (CMS HCC) 03/31/2021    Thyroid disorder     Type 2 diabetes mellitus (CMS HCC)     Wears glasses       Allergy History as of 11/03/22        No Known Allergies                  I completed my transfer of care / handoff to the receiving personnel during which we discussed:  Access, Airway, All key/critical aspects of case discussed, Analgesia, Antibiotics, Expectation of post procedure, Fluids/Product, Gave opportunity for questions and acknowledgement of understanding, Labs and PMHx      Post Location: PACU                                                             Last OR Temp: Temperature: 36.4 C (97.5 F)  ABG:  PH (ARTERIAL)   Date Value Ref Range Status   04/04/2021 7.41 7.35 - 7.45 Final     PH (T)   Date Value Ref Range Status   03/22/2021 7.36 7.35 - 7.45 Final     PCO2 (ARTERIAL)   Date Value Ref Range Status   04/04/2021 41 35 - 45 mm/Hg Final     PCO2 (VENOUS)    Date Value Ref Range Status   05/05/2021 42 41 - 51 mm/Hg Final     PO2 (ARTERIAL)   Date Value Ref Range Status   04/04/2021 77 72 - 100 mm/Hg Final     PO2 (VENOUS)   Date Value Ref Range Status   05/05/2021 58 (H) 35 - 50 mm/Hg Final     SODIUM   Date Value Ref Range Status   03/22/2021 135 (L) 137 - 145 mmol/L Final     POTASSIUM   Date Value Ref Range Status   10/17/2022 3.3 (L) 3.5 - 5.1 mmol/L Final  KETONES   Date Value Ref Range Status   05/04/2022 Not Detected Not Detected mg/dL Final     WHOLE BLOOD POTASSIUM   Date Value Ref Range Status   03/22/2021 3.6 3.5 - 4.6 mmol/L Final     CHLORIDE   Date Value Ref Range Status   03/22/2021 106 101 - 111 mmol/L Final     CALCIUM   Date Value Ref Range Status   10/17/2022 10.5 (H) 8.6 - 10.3 mg/dL Final     Comment:     Gadolinium-containing contrast can interfere with calcium measurement.       Calculated P Axis   Date Value Ref Range Status   10/17/2022 47 degrees Final     Calculated R Axis   Date Value Ref Range Status   10/17/2022 8 degrees Final     Calculated T Axis   Date Value Ref Range Status   10/17/2022 45 degrees Final     IONIZED CALCIUM   Date Value Ref Range Status   05/04/2022 1.83 (HH) 1.15 - 1.33 mmol/L Final     LACTATE   Date Value Ref Range Status   05/05/2021 1.8 (H) 0.0 - 1.3 mmol/L Final     HEMOGLOBIN   Date Value Ref Range Status   03/22/2021 11.1 (L) 12.0 - 18.0 g/dL Final     OXYHEMOGLOBIN   Date Value Ref Range Status   03/22/2021 97.8 85.0 - 98.0 % Final     CARBOXYHEMOGLOBIN   Date Value Ref Range Status   03/22/2021 1.8 0.0 - 2.5 % Final     MET-HEMOGLOBIN   Date Value Ref Range Status   03/22/2021 0.4 0.0 - 2.0 % Final     BASE EXCESS   Date Value Ref Range Status   05/05/2021 5.4 (H) -3.0 - 3.0 mmol/L Final     BASE EXCESS (ARTERIAL)   Date Value Ref Range Status   04/04/2021 1.2 (H) 0.0 - 1.0 mmol/L Final     BASE DEFICIT   Date Value Ref Range Status   04/03/2021 0.2 0.0 - 3.0 mmol/L Final     BICARBONATE (ARTERIAL)   Date  Value Ref Range Status   04/04/2021 25.8 18.0 - 26.0 mmol/L Final     BICARBONATE (VENOUS)   Date Value Ref Range Status   05/05/2021 29.0 (H) 22.0 - 26.0 mmol/L Final     TEMPERATURE, COMP   Date Value Ref Range Status   03/22/2021 36.9 15.0 - 40.0 C Final     %FIO2 (VENOUS)   Date Value Ref Range Status   05/05/2021 36.0 % Final     Airway:* No LDAs found *  Blood pressure 119/76, pulse 90, temperature 36.4 C (97.5 F), resp. rate 16, height 1.778 m (5\' 10" ), weight 72.8 kg (160 lb 9.6 oz), SpO2 91%.

## 2022-11-04 DIAGNOSIS — Z4789 Encounter for other orthopedic aftercare: Secondary | ICD-10-CM

## 2022-11-04 LAB — BASIC METABOLIC PANEL, FASTING
ANION GAP: 5 mmol/L (ref 4–13)
BUN/CREA RATIO: 13 (ref 6–22)
BUN: 13 mg/dL (ref 8–25)
CALCIUM: 9.1 mg/dL (ref 8.6–10.3)
CHLORIDE: 108 mmol/L (ref 96–111)
CO2 TOTAL: 26 mmol/L (ref 23–31)
CREATININE: 1.02 mg/dL (ref 0.75–1.35)
ESTIMATED GFR - MALE: 76 mL/min/BSA (ref >=60)
GLUCOSE: 165 mg/dL — ABNORMAL HIGH (ref 70–99)
POTASSIUM: 3.3 mmol/L — ABNORMAL LOW (ref 3.5–5.1)
SODIUM: 139 mmol/L (ref 136–145)

## 2022-11-04 LAB — H & H
HCT: 37.2 % — ABNORMAL LOW (ref 38.9–52.0)
HGB: 11.7 g/dL — ABNORMAL LOW (ref 13.4–17.5)

## 2022-11-04 LAB — POC BLOOD GLUCOSE (RESULTS): GLUCOSE, POC: 166 mg/dL (ref 80–130)

## 2022-11-04 MED ORDER — APIXABAN 2.5 MG TABLET
2.5000 mg | ORAL_TABLET | Freq: Two times a day (BID) | ORAL | 0 refills | Status: DC
Start: 2022-11-04 — End: 2022-11-04

## 2022-11-04 MED ORDER — APIXABAN 5 MG TABLET
5.0000 mg | ORAL_TABLET | Freq: Two times a day (BID) | ORAL | 0 refills | Status: AC
Start: 2022-11-04 — End: 2022-12-19

## 2022-11-04 NOTE — Care Plan (Signed)
[facility]  Rehabilitation Services  Physical Therapy Progress Note      Patient Name: [name]  Date of Birth: 1945-08-12  Height:  177.8 cm (5\' 10" )  Weight:  72.8 kg (160 lb 9.6 oz)  Room/Bed: 406/A  Payor: [facility] [employer] [employer] / Plan: [place] [facility]/[employer] / Product Type: Managed Care /     Assessment:        11/04/22 0950   Basic Mobility Am-PAC/6Clicks Score (APPROVED Staff)   Turning in bed without bedrails 4   Lying on back to sitting on edge of flat bed 4   Moving to and from a bed to a chair 4   Standing up from chair 4   Walk in room 4   Climbing 3-5 steps with railing 4   6 Clicks Raw Score total 24   Standardized (t-scale) score 57.68     Ambulation completed to increase weight bearing and facilitate normal gait pattern. Lower extremity exercises performed to increase circulation, strength, ROM, motor control, mobility, and activity tolerance.  Patient with increase in ambulation distance with step to gait pattern progressing to step thru.  Completed 90 and 180 degree turns without loss of balance.   Active with all activity. Patient transferred supine to sit with assistance from nursing.  Sit to stand with stand by assist verbal cues for hand placement. Ambulated 120 ft wheeled walker with stand by assist on level surface.  Returned to room and back to bedside chair.   Tolerated fair. Further PT services indicated to progress functional independence and activity tolerance.       Discharge Disposition: home     Plan:   Continue to follow patient according to established plan of care.  The risks/benefits of therapy have been discussed with the patient/caregiver and he/she is in agreement with the established plan of care.     Subjective & Objective:     Patient with complaints of pain 7/10.   Pain medication prior to treatment.  Agreed to participate with therapy.   Patient cleared by RN Edmonia Caprio  for therapy.  Patient seen from [phone] for ambulation. Chart reviewed.  Identified by name and date of birth. Patient transferred supine to sit min assist.  Sit to stand with stand by assist.  Ambulated 120 ft wheeled walker with stand by assist on level surface.  Returned to room and back to bedside chair.      Patient participated with joint camp from [phone].  Completed left hip exercises consisting of ankle pumps, quad sets, hip adduction, hip abduction, heel slides, short arc quads and glut sets x 15 reps.  Assistance needed heel slides and hip abduction. Active with exercises.   Able to recall 2/3 hip precautions.  Patient returned to room and left sitting in bedside chair.  Positioned for comfort, call light in reach and all needs met.       Therapist:   Earl Many, PTA  [id number]    Start Time: 0710  End Time: 0950  Am Treatment Time:  40 minutes    Start Time:   End Time:   Pm Treatment Time:   minutes      Total Treatment Time: 40 minutes  Charges Entered: gait x2, group   Department Number: [id number]

## 2022-11-04 NOTE — Care Plan (Signed)
[facility]  Rehabilitation Services  Occupational Therapy Progress Note    Patient Name: [name]  Date of Birth: [birth year]  Height:  177.8 cm (5\' 10" )  Weight:  72.8 kg (160 lb 9.6 oz)  Room/Bed: 406/A  Payor: [facility] [employer] [employer] / Plan: [place] [facility]/[employer] / Product Type: Managed Care /     Assessment:       11/04/22 1022   Therapist Pager   OT Assigned/ Pager # *AM-10/4   Daily Activity AM-PAC/6-clicks Score   Putting on/Taking off clothing on lower body 2   Bathing 2   Toileting 3   Putting on/Taking off clothing on upper body 4   Personal grooming 4   Eating Meals 4   Raw Score Total 19   Standardized (t-scale) Score 40.22   CMS 0-100% Score 42.8   CMS Modifier CK     Patient required rest periods throughout task secondary to fatigue      Discharge Needs:   Equipment Recommendation: to be determined    Discharge Disposition: to be determined    Plan:   Continue to follow patient according to established plan of care.  The risks/benefits of therapy have been discussed with the patient/caregiver and he/she is in agreement with the established plan of care.     Subjective & Objective:   RN cleared patient for Occupational therapy. When occupational therapy entered room patient was in bed supine and agreeable to therapy this date. Patient completed sit to stands x2 and ambulated 25 ft with CGA provided using [employer]. Patient returned to seated in chair with SPV. Patient is left in bed supine with call light in reach and all needs met.      Pain: no complaints at this time          Therapist:   Coralyn Helling, COTA     Start Time: 1191  End Time: 1022  Total Treatment Time: 24 minutes  Charges Entered: 2 OT TherACT  Department Number: [id number]

## 2022-11-04 NOTE — Nurses Notes (Signed)
Pt d/c instructions given. No questions or concerns. Prescriptions given. IV removed. Pt taken down via wheelchair

## 2022-11-04 NOTE — Care Management Notes (Signed)
[facility]  Care Management Initial Evaluation    Patient Name: [name]  Date of Birth: 03/25/1945  Sex: male  Date/Time of Admission: 11/03/2022  6:33 AM  Room/Bed: 406/A  Payor: [facility] [employer] [employer] / Plan: [employer] [place] VACCN/OPTUM / Product Type: Managed Care /   Primary Care Providers:  [place], [place] Lucretia Roers (General)    Pharmacy Info:   Preferred Pharmacy       [phone]    [address] [place] [address]    Phone: [phone] Fax: [phone]    Hours: Not open 24 hours    [facility] - [place], [place] - [address]    [address] [place] [address]    Phone: [phone] Fax: [phone]    Hours: Not open 24 hours    [facility]    [address] [place] [address]    Phone: [phone] Fax: [phone]    Hours: 24/7          Emergency Contact Info:   Extended Emergency Contact Information  Primary Emergency Contact: [name]  Address: [address]           [place], [place] [address] [place]  Home Phone: [phone]  Work Phone: [phone]  Mobile Phone: [phone]  Relation: Wife  Preferred language: English  Interpreter needed? 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]    Assessment:      11/04/22 1112   Assessment Details   Assessment Type Admission   Living Environment   Lives With spouse   Living Arrangements house   Able to Return to Prior Arrangements yes   Home Safety   Home Accessibility ramps present at home   Care Management Plan   Discharge Planning Status initial meeting   Discharge plan discussed with: Patient;Spouse   Discharge Needs Assessment   Equipment Currently Used at Home cane, straight;walker, front  wheeled;wheelchair   Equipment Needed After Discharge none   Discharge Facility/Level of Care Needs Home (Patient/Family Member/other)(code 1)   Transportation Available family or friend will provide   ADVANCE DIRECTIVES   Does the Patient have an Advance Directive? Yes, Patient Does Have Advance Directive for Healthcare Treatment   Copy of Advance Directives in Chart? Yes, Copy on Chart.(Specify in Comment Which Advance Directive)   Mutuality/Individual Preferences    Patient-Specific Preferences Prefers mid-morning appointments         Discharge Plan:  Home (Patient/Family Member/other) (code 1)  Lives with wife in home with ramp to entrance. DME as above, no additional need verbalized. AD on file. Wife will provide transportation at discharge. No additional needs stated at this time.    The patient will continue to be evaluated for developing discharge needs.     Case Manager: Phill Myron, CASE MANAGER  Phone: [phone]

## 2022-11-04 NOTE — Care Plan (Signed)
Problem: Adult Inpatient Plan of Care  Goal: Plan of Care Review  Outcome: Ongoing (see interventions/notes)  Flowsheets (Taken 11/04/2022 0825)  Outcome Evaluation:   surgery completed   pain managed  Progress: improving  Plan of Care Reviewed With: patient  Goal: Patient-Specific Goal (Individualized)  Outcome: Ongoing (see interventions/notes)  Flowsheets (Taken 11/04/2022 0825)  Patient-Specific Goals (Include Timeframe): discharge home today  Plan of Care Reviewed With: patient  Goal: Absence of Hospital-Acquired Illness or Injury  Outcome: Ongoing (see interventions/notes)  Intervention: Identify and Manage Fall Risk  Recent Flowsheet Documentation  Taken 11/03/2022 2315 by Myrla Halsted, RN  Safety Promotion/Fall Prevention:   activity supervised   fall prevention program maintained   safety round/check completed   nonskid shoes/slippers when out of bed  Intervention: Prevent Skin Injury  Recent Flowsheet Documentation  Taken 11/03/2022 2315 by Myrla Halsted, RN  Body Position: supine, head elevated  Skin Protection:   adhesive use limited   incontinence pads utilized   tubing/devices free from skin contact   transparent dressing maintained  Intervention: Prevent and Manage VTE (Venous Thromboembolism) Risk  Recent Flowsheet Documentation  Taken 11/03/2022 2315 by Myrla Halsted, RN  VTE Prevention/Management:   ambulation promoted   dorsiflexion/plantar flexion performed   sequential compression devices on  Goal: Optimal Comfort and Wellbeing  Outcome: Ongoing (see interventions/notes)  Intervention: Provide Person-Centered Care  Recent Flowsheet Documentation  Taken 11/03/2022 2315 by Myrla Halsted, RN  Trust Relationship/Rapport:   care explained   choices provided   reassurance provided   thoughts/feelings acknowledged  Goal: Rounds/Family Conference  Outcome: Ongoing (see interventions/notes)     Problem: Pain Acute  Goal: Optimal Pain Control and Function  Outcome: Ongoing (see interventions/notes)  Intervention: Prevent or Manage  Pain  Recent Flowsheet Documentation  Taken 11/03/2022 2315 by Myrla Halsted, RN  Sleep/Rest Enhancement:   awakenings minimized   room darkened   regular sleep/rest pattern promoted  Intervention: Optimize Psychosocial Wellbeing  Recent Flowsheet Documentation  Taken 11/03/2022 2315 by Myrla Halsted, RN  Diversional Activities: television  Supportive Measures: active listening utilized     Problem: Fall Injury Risk  Goal: Absence of Fall and Fall-Related Injury  Outcome: Ongoing (see interventions/notes)  Intervention: Identify and Manage Contributors  Recent Flowsheet Documentation  Taken 11/03/2022 2315 by Myrla Halsted, RN  Self-Care Promotion:   independence encouraged   BADL personal objects within reach   BADL personal routines maintained  Intervention: Promote Injury-Free Environment  Recent Flowsheet Documentation  Taken 11/03/2022 2315 by Myrla Halsted, RN  Safety Promotion/Fall Prevention:   activity supervised   fall prevention program maintained   safety round/check completed   nonskid shoes/slippers when out of bed     Problem: Infection  Goal: Absence of Infection Signs and Symptoms  Outcome: Ongoing (see interventions/notes)  Intervention: Prevent or Manage Infection  Recent Flowsheet Documentation  Taken 11/03/2022 2315 by Myrla Halsted, RN  Fever Reduction/Comfort Measures:   lightweight bedding   lightweight clothing     Problem: Hip Arthroplasty  Goal: Optimal Coping  Outcome: Ongoing (see interventions/notes)  Intervention: Support Psychosocial Response to Surgery and Mobility Changes  Recent Flowsheet Documentation  Taken 11/03/2022 2315 by Myrla Halsted, RN  Supportive Measures: active listening utilized  Goal: Absence of Bleeding  Outcome: Ongoing (see interventions/notes)  Goal: Effective Bowel Elimination  Outcome: Ongoing (see interventions/notes)  Goal: Fluid and Electrolyte Balance  Outcome: Ongoing (see interventions/notes)  Goal: Optimal Functional Ability  Outcome: Ongoing (see interventions/notes)  Intervention: Promote Optimal  Functional  Status  Recent Flowsheet Documentation  Taken 11/03/2022 2315 by Myrla Halsted, RN  Self-Care Promotion:   independence encouraged   BADL personal objects within reach   BADL personal routines maintained  Activity Management: ambulated to bathroom  Assistive Device Utilized: walker  Goal: Absence of Infection Signs and Symptoms  Outcome: Ongoing (see interventions/notes)  Intervention: Prevent or Manage Infection  Recent Flowsheet Documentation  Taken 11/03/2022 2315 by Myrla Halsted, RN  Fever Reduction/Comfort Measures:   lightweight bedding   lightweight clothing  Goal: Intact Neurovascular Status  Outcome: Ongoing (see interventions/notes)  Goal: Anesthesia/Sedation Recovery  Outcome: Ongoing (see interventions/notes)  Intervention: Optimize Anesthesia Recovery  Recent Flowsheet Documentation  Taken 11/03/2022 2315 by Myrla Halsted, RN  Safety Promotion/Fall Prevention:   activity supervised   fall prevention program maintained   safety round/check completed   nonskid shoes/slippers when out of bed  Goal: Acceptable Pain Control  Outcome: Ongoing (see interventions/notes)  Intervention: Prevent or Manage Pain  Recent Flowsheet Documentation  Taken 11/03/2022 2315 by Myrla Halsted, RN  Diversional Activities: television  Goal: Nausea and Vomiting Relief  Outcome: Ongoing (see interventions/notes)  Goal: Effective Urinary Elimination  Outcome: Ongoing (see interventions/notes)  Goal: Effective Oxygenation and Ventilation  Outcome: Ongoing (see interventions/notes)  Intervention: Optimize Oxygenation and Ventilation  Recent Flowsheet Documentation  Taken 11/03/2022 2315 by Myrla Halsted, RN  Head of Bed (HOB) Positioning: HOB at 20-30 degrees

## 2022-11-04 NOTE — Progress Notes (Addendum)
[facility]    Orthopaedic Surgery - Post-op Note    Patient: [name]  DOB: [birth year]  MRN: [id number]  Date: 11/04/2022    1 Day Post-Op S/P Left total hip arthroplasty    Subjective   Patient seen at bedside.  No acute events overnight.  Reports some soreness to the left hip but relatively well-controlled.  No numbness or paresthesias.  No fevers chills nausea or vomiting.  He has been up ambulating with a walker postoperatively.  Plans to discharge home today.    Objective     Filed Vitals:    11/03/22 1425 11/03/22 2036 11/04/22 0047 11/04/22 0440   BP: 129/74 134/78 122/73 120/74   Pulse: 90 89 86 80   Resp: 16 16 18 18    Temp: 36.5 C (97.7 F) 36.8 C (98.2 F) 36.7 C (98.1 F) 36.6 C (97.9 F)   SpO2: 99% 96% 96% 93%       Gen: NAD  Lungs: non-labored breathing  CV: pulses regular rate  Abd: non-distended  Msk:  LLE: dressing / abduction pillow in place, c/d/i, cap refill <2s, wiggles all toes, SILT in all toes, plantar and dorsal foot, + DF/PF ankle    Assessment   77 y.o. male 1 Day Post-Op s/p Left total hip arthroplasty    Plan     Admit to ortho  Medicine consult for medical comanagement  Posterior hip precautions in place  Hip abduction pillow in place for 24 hours then while in bed  Keep dressing/abduction pillow C/D/I  Weightbearing status: WBAT to LLE   DVT Prophylaxis:  Eliquis 5 mg b.i.d. for 6 weeks.  ABX: 24 hours post op Ancef  PT/OT: OOB  Diet: Okay per ortho  Dispo: Pending patient progress, pain control, PT/OT. Likely discharge home today after PT (10/4). F/U in 2 weeks for wound check outpatient.     Marval Regal, PA-C     This note was partially generated using MModal Fluency Direct system, and there may be some incorrect words, spellings, and punctuation that were not noted in checking the note before saving       Addendum:  We will change deep vein thrombosis prophylaxis from aspirin to Eliquis 5 mg b.i.d. for 6 weeks  due to recommendation from oncologist as patient has history of deep vein thrombosis/PE.  New printed prescription placed in patient's chart and aspirin prescription shredded.

## 2022-11-04 NOTE — Care Management Notes (Signed)
SCIP  OR end date:10/3   OR end time:1157  Prophylactic antibiotic to be discontinued by:10/4 1157  Foley catheter to be discontinued by:N/A  Appropriate VTE prophylaxis to be administered by:10/4 2359  Beta blocker listed as a home medication prior to OR? No, Excluded  Beta blocker to be resumed by:N/A, no home BB

## 2022-11-07 ENCOUNTER — Telehealth (INDEPENDENT_AMBULATORY_CARE_PROVIDER_SITE_OTHER): Payer: Self-pay | Admitting: Orthopaedic Surgery

## 2022-11-07 NOTE — Telephone Encounter (Signed)
Pt. Called and said he is having severe blood blisters and redness around his incisions.     LT THA 10/3/2 [employer] [facility]

## 2022-11-07 NOTE — Telephone Encounter (Signed)
Patient is coming in tomorrow for a nurse visit.    Renee Rival, MA

## 2022-11-08 ENCOUNTER — Ambulatory Visit (INDEPENDENT_AMBULATORY_CARE_PROVIDER_SITE_OTHER): Payer: Self-pay

## 2022-11-08 ENCOUNTER — Other Ambulatory Visit: Payer: Self-pay

## 2022-11-08 DIAGNOSIS — Z9889 Other specified postprocedural states: Secondary | ICD-10-CM

## 2022-11-08 NOTE — Nursing Note (Signed)
Patient came in today for wound check s/p left total hip arthroplasty on 11/03/22 by Dr. Alycia Rossetti. He had collection of blood/drainage under dressing. Dressing removed today in office. He does have some redness, warmth and hematoma more towards the thigh side of incision. Incision checked by [facility] he was having very minimal drainage after dressing was removed. ABD pads and Hypafix applied today in clinic extra dressings sent with patient advised to keep clean and dry change dressing as needed. F/U on Thurs.      Edwin Cap, MA

## 2022-11-10 ENCOUNTER — Other Ambulatory Visit: Payer: Self-pay

## 2022-11-10 ENCOUNTER — Other Ambulatory Visit (INDEPENDENT_AMBULATORY_CARE_PROVIDER_SITE_OTHER): Payer: Self-pay | Admitting: Orthopaedic Surgery

## 2022-11-10 ENCOUNTER — Ambulatory Visit (INDEPENDENT_AMBULATORY_CARE_PROVIDER_SITE_OTHER): Payer: 59 | Admitting: PHYSICIAN ASSISTANT

## 2022-11-10 ENCOUNTER — Encounter (INDEPENDENT_AMBULATORY_CARE_PROVIDER_SITE_OTHER): Payer: Self-pay | Admitting: PHYSICIAN ASSISTANT

## 2022-11-10 VITALS — BP 108/60 | HR 89 | Ht 70.0 in | Wt 160.0 lb

## 2022-11-10 DIAGNOSIS — Z9889 Other specified postprocedural states: Secondary | ICD-10-CM

## 2022-11-10 DIAGNOSIS — Z4889 Encounter for other specified surgical aftercare: Secondary | ICD-10-CM

## 2022-11-10 DIAGNOSIS — Z96642 Presence of left artificial hip joint: Secondary | ICD-10-CM

## 2022-11-10 DIAGNOSIS — Z5189 Encounter for other specified aftercare: Secondary | ICD-10-CM

## 2022-11-10 MED ORDER — OXYCODONE-ACETAMINOPHEN 5 MG-325 MG TABLET
1.0000 | ORAL_TABLET | ORAL | 0 refills | Status: DC | PRN
Start: 2022-11-10 — End: 2023-02-09

## 2022-11-10 NOTE — Telephone Encounter (Addendum)
Patient was seen for post op on 11/10/22 by [facility] s/p left THA 11/03/22. Patient is requesting pain medication refill. Rx order dropped. Thank you!    Rosetta Posner, CMA

## 2022-11-10 NOTE — Progress Notes (Signed)
[facility]  [address]  [place] [address]    Postop Note    Name: [name] MRN:  [id number]   Date: 11/10/2022 DOB:  [birth year] (77 y.o.)             Chief Complaint:  Postop visit/wound check    HPI: [name], DOB: 18-Jun-1945 , is a 77 y.o. male presenting for a wound check.  The patient is 1 week status post left total hip arthroplasty by Dr. Alycia Rossetti on 11/03/2022.  He was seen in our office earlier this week for concerns about drainage from his incision.  He is here today for a scheduled recheck of his incision.  Since he was last here he is still wearing the absorbable dressing we put on in office.  He has had no additional drainage, no need to  change the dressing.  He denies any fevers.  He is ambulating with a walker and is currently in physical therapy.  He has requested a hand-held urinal.  He also requests a refill of his Percocet.  The patient also says that his pharmacy, [facility], contacted him about his prescription for Eliquis.  The patient says he has not been on Eliquis at all since leaving the hospital because there is a problem with his prescription order.  He says the pharmacy told him they could not reach Dr. Alycia Rossetti.  The patient has been taking 1 81 mg aspirin daily since being home.    Medical History     Past Medical History  Current Outpatient Medications   Medication Sig    acetaminophen (TYLENOL) 325 mg Oral Tablet Take 2 Tablets (650 mg total) by mouth Every 6 hours as needed for Pain    amLODIPine (NORVASC) 10 mg Oral Tablet Take 1 Tablet (10 mg total) by mouth Once a day    apixaban (ELIQUIS) 5 mg Oral Tablet Take 1 Tablet (5 mg total) by mouth Twice daily for 45 days (Patient not taking: Reported on 11/10/2022)    aspirin (ECOTRIN) 81 mg Oral Tablet, Delayed Release (E.C.) Take 1 Tablet (81 mg total) by mouth Twice daily for 30 days    atorvastatin (LIPITOR) 10 mg Oral Tablet Take 1 Tablet (10 mg total) by  mouth Every evening    Blood-Glucose Meter,Continuous (FREESTYLE LIBRE 3 READER) Does not apply Misc 1 Kit continuous    Blood-Glucose Sensor (FREESTYLE LIBRE 3 SENSOR) Does not apply Device 1 Each Every 14 days Indications: type 2 diabetes mellitus    cyanocobalamin (VITAMIN B 12) 1,000 mcg Oral Tablet Take 1 Tablet (1,000 mcg total) by mouth Every morning    docusate sodium (COLACE) 100 mg Oral Capsule Take 1 Capsule (100 mg total) by mouth    elvitegravir-cobicistat-emtricitrabine-tenofovir alafen (GENVOYA) 150-150-200-10 mg Oral Tablet Take 1 Tablet by mouth Once a day    ferrous sulfate (FEOSOL) 325 mg (65 mg iron) Oral Tablet Take 1 Tablet (325 mg total) by mouth Once a day    flash glucose sensor (FREESTYLE LIBRE 2 SENSOR) Does not apply Kit Change every 14 days    gabapentin (NEURONTIN) 100 mg Oral Capsule Take 3 Capsules (300 mg total) by mouth Three times a day For shingle pain    glipiZIDE (GLUCOTROL) 5 mg Oral Tablet Take 1.5 tablet twice daily    levothyroxine (SYNTHROID) 88 mcg Oral Tablet Take 75 mcg by mouth Every morning    melatonin 3 mg Oral Tablet Take 10 mg by mouth Every night  MULTIVITAMIN ORAL Take 1 Tablet by mouth Once a day    ondansetron (ZOFRAN) 4 mg Oral Tablet Take 1 Tablet (4 mg total) by mouth Every 8 hours as needed for Nausea/Vomiting    oxyCODONE (OXY IR) 5 mg Oral Capsule Take 1 Capsule (5 mg total) by mouth Twice daily    oxyCODONE-acetaminophen (PERCOCET) 5-325 mg Oral Tablet Take 1 Tablet by mouth Every 4 hours as needed for Pain    pantoprazole (PROTONIX) 40 mg Oral Tablet, Delayed Release (E.C.) Take 1 Tablet (40 mg total) by mouth Once a day    Pen Needle, Disposable, 32 gauge x 5/32" Needle Use one pen needle with daily Lantus injection.    sennosides-docusate sodium (SENOKOT-S) 8.6-50 mg Oral Tablet Take 1 Tablet by mouth Twice daily    sennosides-docusate sodium (SENOKOT-S) 8.6-50 mg Oral Tablet Take 1 Tablet by mouth Every evening     No Known Allergies  Past Medical  History:   Diagnosis Date    C. 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)     Hx of transfusion     Hyperlipidemia     "borderline"    Hypothyroidism     MRSA (methicillin resistant staph aureus) culture positive 01/02/2021    MRSA left groin abscess 01/03/21    MRSA (methicillin resistant staph aureus) culture positive 01/02/2021    MRSA blood 01/02/21    Pulmonary embolism (CMS HCC) 03/31/2021    Thyroid disorder     Type 2 diabetes mellitus (CMS HCC)     Wears glasses          Past Surgical History:   Procedure Laterality Date    ARTHROPLASTY HIP TOTAL Left 11/03/2022    Performed by Gray Bernhardt, MD at [facility] OR MAIN    COLON SURGERY      per patient had part of colon removed and had a colostomy    COLONOSCOPY      COLONOSCOPY WITH POLYPECTOMY N/A 10/14/2022    Performed by Malena Catholic, MD at [facility] OR ENDO    DISTIAL PANCREATECTOMY N/A 03/22/2021    Performed by Marquette Saa, MD at [facility] OR 5 NORTH    ENDOSCOPIC U/S UPPER N/A 04/22/2021    Performed by Shanna Cisco, MD at [facility] OR ENDO    ENDOSCOPIC U/S UPPER N/A 02/08/2021    Performed by Shanna Cisco, MD at [facility] OR ENDO    ENDOSCOPIC U/S WITH CYST GASTROSTOMY N/A 05/20/2021    Performed by Shanna Cisco, MD at [facility] OR ENDO    ENDOSCOPIC U/S WITH CYST GASTROSTOMY N/A 05/06/2021    Performed by Shanna Cisco, MD at [facility] OR ENDO    ERCP N/A 04/22/2021    Performed by Shanna Cisco, MD at [facility] OR ENDO    GASTROSCOPY  06/24/2021    Dr.Thakur  GASTROSCOPY WITH BIOPSY N/A 10/14/2022    Performed by Malena Catholic, MD  at [facility] OR ENDO    HIP SURGERY Right 04/22/2020    Right hip arthrotomy 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>    HX CERVICAL SPINE SURGERY  2001    no hardware    HX COLOSTOMY REVERSAL      HX HERNIA REPAIR Right     inguinal hernia    HX LAPAROTOMY      abdominal    IR ABSCESS/FLUID DRAINAGE OF(PAFD) N/A 04/09/2021    Performed by Kalman Shan, MD at [facility] INTERVENTIONAL RUBY    IR EMBOLIZATION/BODY N/A 04/09/2021    Performed by Kalman Shan, MD at [facility] INTERVENTIONAL RUBY    IR IVC [facility] VENA CAVA FILTER PLACEMENT N/A 04/16/2021    Performed by Carmelina Dane, MD at [facility] INTERVENTIONAL RUBY    IRRIGATION AND DEBRIDEMENT HIP Right 04/22/2020    Performed by Janelle Floor, MD at [facility] OR MAIN    KNEE SURGERY Bilateral     "just cleaned up no replacements"    LAPAROTOMY EXPLORATORY N/A 04/03/2021    Performed by Audry Pili, DO at [facility] OR MAIN    NECROSECTOMY N/A 06/24/2021    Performed by Shanna Cisco, MD at [facility] OR ENDO    PANCREATECTOMY      2023, 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    SPLENECTOMY      2023    SPLENECTOMY N/A 03/22/2021    Performed by Marquette Saa, MD at [facility] OR 5 NORTH    WRIST SURGERY Right      Family Medical History:       Problem Relation (Age of Onset)    Cancer Mother, Father, Other    Heart Attack Mother    High Cholesterol Other    Stroke Other            Social History     Socioeconomic History    Marital status: Married   Tobacco Use    Smoking status: Never    Smokeless tobacco: Never   Vaping Use    Vaping status: Never Used   Substance and Sexual Activity    Alcohol use: Not Currently    Drug use: Never   Other Topics Concern    Ability to Walk 1 Flight of Steps without SOB/CP Yes    Ability to Walk 2 Flight of Steps without SOB/CP No    Ability To Do Own ADL's Yes     Social Determinants of Health     Financial Resource Strain: Low Risk  (05/05/2022)    Financial Resource Strain     SDOH Financial: No   Transportation Needs:  Low Risk  (05/05/2022)    Transportation Needs     SDOH Transportation: No   Social Connections: High Risk (11/03/2022)    Social Connections     SDOH Social Isolation: Less than once a week   Intimate Partner Violence: Low Risk  (12/31/2021)    Intimate Partner Violence     SDOH Domestic Violence: No   Housing Stability: Low Risk  (05/05/2022)    Housing Stability     SDOH Housing Situation: I have housing.     SDOH Housing Worry: No       Objective   Vitals:  BP 108/60 (Site: Right Arm, Patient Position: Sitting)   Pulse 89   Ht 1.778 m (5\' 10" )  Wt 72.6 kg (160 lb) Comment: verbal  SpO2 95%   BMI 22.96 kg/m       Body mass index is 22.96 kg/m.    Review of Systems:  Review of Systems   Respiratory:  Negative for shortness of breath.    Cardiovascular:  Negative for chest pain.       Physical Exam:  Physical Exam  Musculoskeletal:      Comments: Left hip exam:  Normal postoperative swelling.  The incision is clean, dry, well-approximated and healing.  Staples are in place.  There is no erythema, no warmth, no active drainage.  This has improved compared to when I saw his incision 2 days ago.  There was a very scant amount of clear/serous fluid on his dressing from 2 days ago.  Gait is steady with walker.   Neurological:      Mental Status: He is alert and oriented to person, place, and time.   Psychiatric:         Mood and Affect: Mood normal.         Laboratory Studies/Data Reviewed   I have reviewed all available imagining and laboratory studies as appropriate.      Imaging:  No new studies today    Assessment/Plan   Diagnosis:    ICD-10-CM    1. Visit for wound check  Z51.89       2. History of total left hip replacement  Z96.642       3. Postoperative visit  Z48.89               Encounter Medications and Orders  No orders of the defined types were placed in this encounter.      Plan:    [facility] to see what the issue is with his Eliquis order and make adjustments appropriately if a  alternative anticoagulant is needed.  A prescription refill request was sent to Dr. Alycia Rossetti for authorization for the Percocet.  Overall, his incision looks good and shows no signs of infection.  A dry sterile dressing was placed over his incision.  He will continue with his physical therapy, weight-bearing as tolerated.  He was given a hand-held urinal to use at home.  We will see him back next week for his 2 week postop visit as previously scheduled.  He was advised to notify us sooner if any worsening symptoms develop regarding his incision.  Was instructed to continue with the baby aspirin in the meantime until we have his Eliquis situation straightened out.    Return for As previously scheduled.  [place], [place]  11/10/2022, 09:38        I am seeing this patient independently with co-signing supervising physician present in clinic.        This note was partially generated using MModal Fluency Direct system, and there may be some incorrect words, spellings, and punctuation that were not noted in checking the note before saving

## 2022-11-16 ENCOUNTER — Other Ambulatory Visit (INDEPENDENT_AMBULATORY_CARE_PROVIDER_SITE_OTHER): Payer: Self-pay | Admitting: Internal Medicine

## 2022-11-16 MED ORDER — DEXCOM G7 SENSOR DEVICE
1.0000 | 3 refills | Status: DC
Start: 2022-11-16 — End: 2022-12-06

## 2022-11-16 MED ORDER — DEXCOM G7 RECEIVER
0 refills | Status: DC
Start: 2022-11-16 — End: 2023-05-05

## 2022-11-16 NOTE — Telephone Encounter (Signed)
[facility] pharmacy left message stating [name] 3 sensors backordered.  Need alternative.    Maryruth Hancock, RN

## 2022-11-17 ENCOUNTER — Encounter (INDEPENDENT_AMBULATORY_CARE_PROVIDER_SITE_OTHER): Payer: Self-pay | Admitting: Surgical

## 2022-11-17 ENCOUNTER — Other Ambulatory Visit: Payer: Self-pay

## 2022-11-17 ENCOUNTER — Ambulatory Visit (INDEPENDENT_AMBULATORY_CARE_PROVIDER_SITE_OTHER): Payer: 59 | Admitting: Surgical

## 2022-11-17 VITALS — BP 110/80 | HR 95 | Ht 70.0 in | Wt 160.1 lb

## 2022-11-17 DIAGNOSIS — Z471 Aftercare following joint replacement surgery: Secondary | ICD-10-CM

## 2022-11-17 DIAGNOSIS — Z96642 Presence of left artificial hip joint: Secondary | ICD-10-CM

## 2022-11-17 NOTE — Progress Notes (Signed)
[facility] Medicine  [facility]      PATIENT NAME:  [name]    MEDICAL RECORD NUMBER: [id number]    DOB:  09/05/1945  DOS:  11/17/2022    REASON FOR VISIT: Post Operative Left THA        [name] is a 77 y.o. male who presents for a routine post operative visit for  post op appointment for left Total hip arthroplasty on 11/03/22 by Dr. Alycia Rossetti at [facility]..  Patient states he is having pain 6/10 .  He reports that the Percocet and Tylenol are working well to control his pain.  He has started Physical Therapy at [facility] on [address].  He is currently using a walker to ambulate.  [name] has no reported new trauma.  No reported fevers or chills. Denies any issues with incision.      MEDICATIONS:  acetaminophen (TYLENOL) 325 mg Oral Tablet, Take 2 Tablets (650 mg total) by mouth Every 6 hours as needed for Pain  amLODIPine (NORVASC) 10 mg Oral Tablet, Take 1 Tablet (10 mg total) by mouth Once a day  apixaban (ELIQUIS) 5 mg Oral Tablet, Take 1 Tablet (5 mg total) by mouth Twice daily for 45 days (Patient not taking: Reported on 11/10/2022)  aspirin (ECOTRIN) 81 mg Oral Tablet, Delayed Release (E.C.), Take 1 Tablet (81 mg total) by mouth Twice daily for 30 days  atorvastatin (LIPITOR) 10 mg Oral Tablet, Take 1 Tablet (10 mg total) by mouth Every evening  Blood-Glucose Meter,Continuous (DEXCOM G7 RECEIVER) Does not apply Misc, Use with sensor  Blood-Glucose Sensor (DEXCOM G7 SENSOR) Does not apply Device, 1 Each Every 10 days  cyanocobalamin (VITAMIN B 12) 1,000 mcg Oral Tablet, Take 1 Tablet (1,000 mcg total) by mouth Every morning  docusate sodium (COLACE) 100 mg Oral Capsule, Take 1 Capsule (100 mg total) by mouth  elvitegravir-cobicistat-emtricitrabine-tenofovir alafen (GENVOYA) 150-150-200-10 mg Oral Tablet, Take 1 Tablet by mouth Once a day  ferrous sulfate (FEOSOL) 325 mg (65 mg iron) Oral Tablet, Take 1 Tablet (325 mg total) by mouth Once a day  flash glucose  sensor (FREESTYLE LIBRE 2 SENSOR) Does not apply Kit, Change every 14 days  gabapentin (NEURONTIN) 100 mg Oral Capsule, Take 3 Capsules (300 mg total) by mouth Three times a day For shingle pain  glipiZIDE (GLUCOTROL) 5 mg Oral Tablet, Take 1.5 tablet twice daily  levothyroxine (SYNTHROID) 88 mcg Oral Tablet, Take 75 mcg by mouth Every morning  melatonin 3 mg Oral Tablet, Take 10 mg by mouth Every night  MULTIVITAMIN ORAL, Take 1 Tablet by mouth Once a day  ondansetron (ZOFRAN) 4 mg Oral Tablet, Take 1 Tablet (4 mg total) by mouth Every 8 hours as needed for Nausea/Vomiting  oxyCODONE (OXY IR) 5 mg Oral Capsule, Take 1 Capsule (5 mg total) by mouth Twice daily  oxyCODONE-acetaminophen (PERCOCET) 5-325 mg Oral Tablet, Take 1 Tablet by mouth Every 4 hours as needed for Pain  pantoprazole (PROTONIX) 40 mg Oral Tablet, Delayed Release (E.C.), Take 1 Tablet (40 mg total) by mouth Once a day  Pen Needle, Disposable, 32 gauge x 5/32" Needle, Use one pen needle with daily Lantus injection.  sennosides-docusate sodium (SENOKOT-S) 8.6-50 mg Oral Tablet, Take 1 Tablet by mouth Twice daily  sennosides-docusate sodium (SENOKOT-S) 8.6-50 mg Oral Tablet, Take 1 Tablet by mouth Every evening    No facility-administered medications prior to visit.      ALLERGIES:  Allergy History as of 11/17/22  No Known Allergies                    Physical Exam:    BP 110/80   Pulse 95   Ht 1.778 m (5\' 10" )   Wt 72.6 kg (160 lb 0.9 oz)   SpO2 98%   BMI 22.97 kg/m         GENERAL: he is alert, cooperative, no distress, appears stated age.      Orthopedic Evaluation:  Patient displays an assisted gait with walker.. A problem focused exam of the left hip reveals a PROM 0-90 degrees comfortably.  Compartments of the thigh are supple. Skin is clear and surgical incision is clean, dry, and intact. Neurovascularly intact.    Assessment:    Status post Left THA      Plan:  I have stressed the importance of progression of range of motion and  strengthening with physical therapy. Medications have been reviewed. A prescription refill request for Percocet 5/325 was sent to the surgeon for approval.  He is on Eliquis 5 mg he will be on that for 6 weeks for deep vein thrombosis prophylaxis.  The patient was instructed to call with any new or concerning symptoms.A scheduled follow up appointment has been set with Dr. Alycia Rossetti.  X-rays will be performed at that office visit.  All questions answered completely.      Encounter Medications and Orders:  No orders of the defined types were placed in this encounter.      Jefm Bryant, PA-C    I am seeing this patient independentlywith supervising physician present in clinic.        This note was partially generated using MModal Fluency Direct system, and there may be some incorrect words, spellings, and punctuation that were not noted in checking the note before saving

## 2022-11-18 ENCOUNTER — Ambulatory Visit (INDEPENDENT_AMBULATORY_CARE_PROVIDER_SITE_OTHER): Payer: Self-pay | Admitting: Orthopaedic Surgery

## 2022-11-18 ENCOUNTER — Other Ambulatory Visit (HOSPITAL_COMMUNITY): Payer: Self-pay | Admitting: Orthopaedic Surgery

## 2022-11-18 MED ORDER — APIXABAN 5 MG TABLET
5.0000 mg | ORAL_TABLET | Freq: Two times a day (BID) | ORAL | 0 refills | Status: DC
Start: 2022-11-18 — End: 2022-12-14

## 2022-11-18 NOTE — Telephone Encounter (Signed)
I sent a prescription for Eliquis to his pharmacy   -Dr. Alycia Rossetti

## 2022-11-18 NOTE — Telephone Encounter (Signed)
Patient had a L THA on 11/03/22 with you.  You gave a two week prescription for Eliquis.  He is starting his second week.  Dr. Eula Listen states he needs to be on Eliquis for a total of six weeks due to history of blood clots.  Eliquis prescribed by Revonda Standard on 11/04/22 for 90 tablets daily for 45 days.  It looks like the prescription printed.  Please advise if this is something you will call in for the next 4 weeks.  Thanks!    [name] Meek, MA

## 2022-11-18 NOTE — Telephone Encounter (Signed)
Patient voiced understanding.    Massie Cogliano, MA

## 2022-11-21 ENCOUNTER — Other Ambulatory Visit (INDEPENDENT_AMBULATORY_CARE_PROVIDER_SITE_OTHER): Payer: Self-pay | Admitting: Orthopaedic Surgery

## 2022-11-21 DIAGNOSIS — Z96642 Presence of left artificial hip joint: Secondary | ICD-10-CM

## 2022-11-21 MED ORDER — APIXABAN 5 MG TABLET
5.0000 mg | ORAL_TABLET | Freq: Two times a day (BID) | ORAL | 5 refills | Status: DC
Start: 2022-11-21 — End: 2023-02-09

## 2022-11-21 MED ORDER — OXYCODONE-ACETAMINOPHEN 5 MG-325 MG TABLET
1.0000 | ORAL_TABLET | Freq: Four times a day (QID) | ORAL | 0 refills | Status: DC | PRN
Start: 2022-11-21 — End: 2022-11-25

## 2022-11-21 NOTE — Telephone Encounter (Signed)
Requesting new fill pain medication s/p left THA on 11/03/22. Last received #30 on 11/10/22.   Thanks!

## 2022-11-21 NOTE — Telephone Encounter (Signed)
Eliquis rx needs to go to [place] instead of local pharmacy d/t cost.  Thanks!

## 2022-11-22 NOTE — H&P (Unsigned)
GASTROENTEROLOGY, MEDICAL OFFICE BUILDING B  [address]  [name] [place] [address]  [phone]    Progress Note  Name: [name]  MRN: [id number]  DOB: 1945-02-15  Age: 77 y.o.  Date: 11/23/2022    Reason for Visit: No chief complaint on file.    History of Present Illness:  [name] is a 77 y.o. male who presents today for ***.    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      Patient History:  Past Medical History:   Diagnosis Date    C. 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)     Hx of transfusion     Hyperlipidemia     "borderline"    Hypothyroidism     MRSA (methicillin resistant staph aureus) culture positive 01/02/2021    MRSA left groin abscess 01/03/21    MRSA  (methicillin resistant staph aureus) culture positive 01/02/2021    MRSA blood 01/02/21    Pulmonary embolism (CMS HCC) 03/31/2021    Thyroid disorder     Type 2 diabetes mellitus (CMS HCC)     Wears glasses          Past Surgical History:   Procedure Laterality Date    COLON SURGERY      per patient had part of colon removed and had a colostomy    COLONOSCOPY      GASTROSCOPY  06/24/2021    Dr.Thakur    HIP SURGERY Right 04/22/2020    Right hip arthrotomy 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>    HX CERVICAL SPINE SURGERY  2001    no hardware    HX COLOSTOMY REVERSAL      HX HERNIA REPAIR Right     inguinal hernia    HX HIP SURGERY Left 11/03/2022    Left total hip arthroplasty  Dr. Vernia Buff LAPAROTOMY      abdominal    KNEE SURGERY Bilateral     "just cleaned up no replacements"    PANCREATECTOMY      2023, 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    SPLENECTOMY      2023    WRIST SURGERY Right          Current Outpatient Medications   Medication Sig    acetaminophen (TYLENOL) 325 mg Oral Tablet Take 2 Tablets (650 mg total) by mouth Every 6 hours as needed for Pain    amLODIPine (NORVASC) 10 mg Oral Tablet Take 1 Tablet (10 mg total) by mouth Once a day    apixaban (ELIQUIS) 5 mg Oral Tablet Take 1 Tablet (5 mg total) by mouth Twice daily for 45 days (Patient not taking: Reported on 11/10/2022)    apixaban (ELIQUIS) 5 mg Oral Tablet Take 1 Tablet (5 mg total) by mouth Twice daily for 30 days    apixaban (ELIQUIS) 5 mg Oral Tablet Take 1 Tablet (5 mg total) by mouth Twice daily    aspirin (ECOTRIN) 81 mg Oral Tablet, Delayed Release (E.C.) Take 1 Tablet (81 mg total) by mouth Twice daily for 30 days    atorvastatin (LIPITOR) 10 mg Oral Tablet Take 1 Tablet (10 mg total) by mouth Every evening    Blood-Glucose Meter,Continuous (DEXCOM G7 RECEIVER) Does not apply Misc Use with sensor    Blood-Glucose Sensor (DEXCOM G7 SENSOR) Does not apply Device 1 Each Every 10  days    cyanocobalamin (VITAMIN B 12) 1,000 mcg Oral Tablet Take 1 Tablet (1,000 mcg total) by mouth Every morning    docusate sodium (COLACE) 100 mg Oral Capsule Take 1 Capsule (100 mg total) by mouth    elvitegravir-cobicistat-emtricitrabine-tenofovir alafen (GENVOYA) 150-150-200-10 mg Oral Tablet Take 1 Tablet by mouth Once a day    ferrous sulfate (FEOSOL) 325 mg (65 mg iron) Oral Tablet Take 1 Tablet (325 mg total) by mouth Once a day    flash glucose sensor (FREESTYLE LIBRE 2 SENSOR) Does not apply Kit Change every 14 days    gabapentin (NEURONTIN) 100 mg Oral Capsule Take 3 Capsules (300 mg total) by mouth Three times a day For shingle pain    glipiZIDE (GLUCOTROL) 5 mg Oral Tablet Take 1.5 tablet twice daily    levothyroxine (SYNTHROID) 88 mcg Oral Tablet Take 75 mcg by mouth Every morning    melatonin 3 mg Oral Tablet Take 10 mg by mouth Every night    MULTIVITAMIN ORAL Take 1 Tablet by mouth Once a day    ondansetron (ZOFRAN) 4 mg Oral Tablet Take 1 Tablet (4 mg total) by mouth Every 8 hours as needed for Nausea/Vomiting    oxyCODONE (OXY IR) 5 mg Oral Capsule Take 1 Capsule (5 mg total) by mouth Twice daily    oxyCODONE-acetaminophen (PERCOCET) 5-325 mg Oral Tablet Take 1 Tablet by mouth Every 4 hours as needed for Pain    oxyCODONE-acetaminophen (PERCOCET) 5-325 mg Oral Tablet Take 1 Tablet by mouth Every 6 hours as needed for Pain    pantoprazole (PROTONIX) 40 mg Oral Tablet, Delayed Release (E.C.) Take 1 Tablet (40 mg total) by mouth Once a day    Pen Needle, Disposable, 32 gauge x 5/32" Needle Use one pen needle with daily Lantus injection.    sennosides-docusate sodium (SENOKOT-S) 8.6-50 mg Oral Tablet Take 1 Tablet by mouth Twice daily    sennosides-docusate sodium (SENOKOT-S) 8.6-50 mg Oral Tablet Take 1 Tablet by mouth Every evening     No Known Allergies  Family Medical History:       Problem Relation (Age of Onset)    Cancer Mother, Father, Other    Heart Attack  Mother    High Cholesterol Other     Stroke Other            Social History     Socioeconomic History    Marital status: Married     Spouse name: Not on file    Number of children: Not on file    Years of education: Not on file    Highest education level: Not on file   Occupational History    Not on file   Tobacco Use    Smoking status: Never    Smokeless tobacco: Never   Vaping Use    Vaping status: Never Used   Substance and Sexual Activity    Alcohol use: Not Currently    Drug use: Never    Sexual activity: Not on file   Other Topics Concern    Calcium intake adequate Not Asked    Computer Use Not Asked    Drives Not Asked    Exercise Concern Not Asked    Helmet Use Not Asked    Seat Belt Not Asked    Uses Cane Not Asked    Uses walker Not Asked    Uses wheelchair Not Asked    Right hand dominant Not Asked    Left hand dominant Not Asked    Ambidextrous Not Asked    Uses Scooter Not Asked    Activity Limitations Not Asked    Ability to Walk 1 Flight of Steps without SOB/CP Yes    Routine Exercise Not Asked    Ability to Walk 2 Flight of Steps without SOB/CP No    Unable to Ambulate Not Asked    Total Care Not Asked    Ability To Do Own ADL's Yes    Uses Walker Not Asked    Other Activity Level Not Asked    Uses Cane Not Asked   Social History Narrative    Not on file     Social Determinants of Health     Financial Resource Strain: Low Risk  (05/05/2022)    Financial Resource Strain     SDOH Financial: No   Transportation Needs: Low Risk  (05/05/2022)    Transportation Needs     SDOH Transportation: No   Social Connections: High Risk (11/03/2022)    Social Connections     SDOH Social Isolation: Less than once a week   Intimate Partner Violence: Low Risk  (12/31/2021)    Intimate Partner Violence     SDOH Domestic Violence: No   Housing Stability: Low Risk  (05/05/2022)    Housing Stability     SDOH Housing Situation: I have housing.     SDOH Housing Worry: No     Review of Systems:                                      All other review of systems  negative    Physical Exam:  There were no vitals taken for this visit.      Physical Exam  Vitals reviewed.   Constitutional:       General: He is not in acute distress.     Appearance: Normal appearance. He is not ill-appearing.   HENT:      Head: Normocephalic and atraumatic.      Nose: Nose normal.   Eyes:      Extraocular Movements: Extraocular movements intact.  Conjunctiva/sclera: Conjunctivae normal.      Pupils: Pupils are equal, round, and reactive to light.   Pulmonary:      Effort: Pulmonary effort is normal.   Musculoskeletal:         General: No swelling, deformity or signs of injury. Normal range of motion.      Cervical back: Normal range of motion.   Skin:     General: Skin is warm and dry.      Findings: No erythema.   Neurological:      General: No focal deficit present.      Mental Status: He is alert and oriented to person, place, and time. Mental status is at baseline.   Psychiatric:         Mood and Affect: Mood normal.         Behavior: Behavior normal.         Thought Content: Thought content normal.       Assessment and Plan:    Tama Headings, FNP-C  11/22/2022, 08:40    This note may have been partially generated using MModal Fluency Direct system, and there may be some incorrect words, spellings, and punctuation that were not noted in checking the note before saving, though effort was made to avoid such errors.

## 2022-11-23 ENCOUNTER — Other Ambulatory Visit: Payer: Self-pay

## 2022-11-23 ENCOUNTER — Ambulatory Visit: Payer: 59 | Attending: Nurse Practitioner | Admitting: Nurse Practitioner

## 2022-11-23 ENCOUNTER — Encounter (HOSPITAL_BASED_OUTPATIENT_CLINIC_OR_DEPARTMENT_OTHER): Payer: Self-pay | Admitting: Nurse Practitioner

## 2022-11-23 VITALS — BP 92/58 | HR 95 | Ht 70.0 in | Wt 160.5 lb

## 2022-11-23 DIAGNOSIS — K219 Gastro-esophageal reflux disease without esophagitis: Secondary | ICD-10-CM | POA: Insufficient documentation

## 2022-11-23 DIAGNOSIS — D509 Iron deficiency anemia, unspecified: Secondary | ICD-10-CM | POA: Insufficient documentation

## 2022-11-23 DIAGNOSIS — Z860100 Personal history of colon polyps, unspecified: Secondary | ICD-10-CM | POA: Insufficient documentation

## 2022-11-25 ENCOUNTER — Other Ambulatory Visit (INDEPENDENT_AMBULATORY_CARE_PROVIDER_SITE_OTHER): Payer: Self-pay | Admitting: Orthopaedic Surgery

## 2022-11-25 DIAGNOSIS — Z96642 Presence of left artificial hip joint: Secondary | ICD-10-CM

## 2022-11-25 MED ORDER — OXYCODONE-ACETAMINOPHEN 5 MG-325 MG TABLET
1.0000 | ORAL_TABLET | Freq: Four times a day (QID) | ORAL | 0 refills | Status: DC | PRN
Start: 2022-11-25 — End: 2023-02-09

## 2022-11-25 NOTE — Telephone Encounter (Signed)
Patient requesting refill on Percocet.   Lt TKA 10-3-2024[name]    Last refill 11/21/2022    Rx pended if ok.   Maylon Peppers, RT

## 2022-12-05 ENCOUNTER — Other Ambulatory Visit (INDEPENDENT_AMBULATORY_CARE_PROVIDER_SITE_OTHER): Payer: Self-pay | Admitting: Orthopaedic Surgery

## 2022-12-05 DIAGNOSIS — Z96642 Presence of left artificial hip joint: Secondary | ICD-10-CM

## 2022-12-05 NOTE — Telephone Encounter (Addendum)
Dr. Coral Else patient called in for refill on percocet 5/325 mg  last fill was 11/25/2022.      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      Dropped     Robby Sermon, CMA

## 2022-12-06 ENCOUNTER — Other Ambulatory Visit (INDEPENDENT_AMBULATORY_CARE_PROVIDER_SITE_OTHER): Payer: Self-pay | Admitting: Internal Medicine

## 2022-12-06 MED ORDER — FREESTYLE LIBRE 3 PLUS SENSOR DEVICE
3 refills | Status: DC
Start: 2022-12-06 — End: 2023-01-10

## 2022-12-06 NOTE — Telephone Encounter (Signed)
Patient called and seemed a little upset that we swtiched his Rx from [place] to [facility] due to [place] 3 sensors being hard to obtain.  He feels it is not as good as the [place] even though he has not tried it yet.  He would like an Rx sent to the [facility] for the libre 3 Plus sensors.    Maryruth Hancock, RN

## 2022-12-07 ENCOUNTER — Other Ambulatory Visit: Payer: Self-pay

## 2022-12-07 ENCOUNTER — Ambulatory Visit: Payer: 59 | Attending: SURGICAL ONCOLOGY | Admitting: SURGICAL ONCOLOGY

## 2022-12-07 ENCOUNTER — Encounter (INDEPENDENT_AMBULATORY_CARE_PROVIDER_SITE_OTHER): Payer: Self-pay | Admitting: SURGICAL ONCOLOGY

## 2022-12-07 ENCOUNTER — Ambulatory Visit (HOSPITAL_BASED_OUTPATIENT_CLINIC_OR_DEPARTMENT_OTHER)
Admission: RE | Admit: 2022-12-07 | Discharge: 2022-12-07 | Disposition: A | Discharge status: Home / Self Care | Payer: 59 | Source: Ambulatory Visit

## 2022-12-07 VITALS — BP 117/70 | HR 89 | Temp 97.2°F | Ht 70.0 in | Wt 159.6 lb

## 2022-12-07 DIAGNOSIS — K862 Cyst of pancreas: Secondary | ICD-10-CM | POA: Insufficient documentation

## 2022-12-07 DIAGNOSIS — Z90411 Acquired partial absence of pancreas: Secondary | ICD-10-CM

## 2022-12-07 DIAGNOSIS — D490 Neoplasm of unspecified behavior of digestive system: Secondary | ICD-10-CM

## 2022-12-07 DIAGNOSIS — D136 Benign neoplasm of pancreas: Secondary | ICD-10-CM

## 2022-12-07 DIAGNOSIS — Z8546 Personal history of malignant neoplasm of prostate: Secondary | ICD-10-CM | POA: Insufficient documentation

## 2022-12-07 DIAGNOSIS — Z9081 Acquired absence of spleen: Secondary | ICD-10-CM | POA: Insufficient documentation

## 2022-12-07 DIAGNOSIS — Z9049 Acquired absence of other specified parts of digestive tract: Secondary | ICD-10-CM | POA: Insufficient documentation

## 2022-12-07 MED ORDER — GADOPICLENOL 0.5 MMOL/ML INTRAVENOUS SOLUTION
7.2000 mL | INTRAVENOUS | Status: AC
Start: 2022-12-07 — End: 2022-12-07
  Administered 2022-12-07: 7.2 mL via INTRAVENOUS

## 2022-12-07 NOTE — Nurses Notes (Signed)
Pt arrived for his MRI. Pt changed into appropriate MRI attire. PIV established.

## 2022-12-07 NOTE — Progress Notes (Signed)
[place] MEDICINE SURGICAL ONCOLOGY   RETURN PATIENT EVALUATION    Patient: [name]  D.O.B.: April 04, 1945  MRN# [id number]  Date of Service: 12/07/2022    PCP: [facility]    Subjective:  [name] is a 77 y.o. 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   The patient noted that the 3 small pancreatic masses in the distal tail was first discovered incidentally post op of his colectomy back in 2006. The patient noted years later (2017) for the treatment of his prostate cancer that his pancreatic mass showed that mass grew into a single 7 cm mass that was found incidently CTAP.  Therefore, he underwent the above procedure.      He presented to his local ED on 05/04/22 due to BLE weakness and unsteady date after starting insulin. He was found to have hypercalcemia. His CT AP on 05/04/22 revealed an increased in size in the cystic lesion at the [facility] measuring up to 3.5cm. Today, he presents with a MRI MRCP. He denies any abdominal pain, n/v, constipation, diarrhea, steatorrhea, or jaundice. It was discussed that this may represent a postoperative pseudocyst vs IPMN. At his last visit on 06/08/22 EUS was discussed but he opted for surveillance MRI/MRCP given what he had been through and his wife's current medical problems.     Today, he presents with updated MRI/MRCP. He denies any abdominal pain, n/v, constipation, diarrhea, steatorrhea, or jaundice.     All other systems reviewed are negative, except those listed above. The patient denies any changes to his medical, surgical, family or social history since his visit on 08/10/22.    Objective:  Vitals are BP 117/70   Pulse 89   Temp 36.2 C (97.2 F)   Ht 1.778 m (5\' 10" )   Wt 72.4 kg (159 lb 9.8 oz)   SpO2 97%   BMI 22.90 kg/m     On examination the patient is pleasant, in NAD. Trachea is midline. Heart is RRR without murmurs. Respirations are unlabored and lungs are CTA. No peripheral edema or  cyanosis. Abdomen is soft, non-tender and non-distended.Normal muscle strength and tone of all extremities. Skin has no rashes or jaundice. He is alert and oriented without focal motor or sensory deficits. Speech pattern and mood/affect normal.    Data Reviewed:  12/07/22 MRI/MRCP does not reveal any significant change.        Assessment and Plan:  [name] is a 77 y.o. 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   The patient noted that the 3 small pancreatic masses in the distal tail was first discovered incidentally post op of his colectomy back in 2006. The patient noted years later (2017) for the treatment of his prostate cancer that his pancreatic mass showed that mass grew into a single 7 cm mass that was found incidently CTAP.  Therefore, he underwent the above procedure. 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     I independently of the faculty provider spent a total of (10) minutes in direct/indirect care of this patient including initial evaluation, review of laboratory, radiology, diagnostic studies, review of medical record, order entry and coordination of care.  [name], APRN,NP-C, RNFA  The patient was seen with Dr. Lissa Hoard    I saw and examined the patient.  I reviewed the resident's note.  I agree with the findings and  plan of care as documented in the resident's note.  Any exceptions/additions are edited/noted.    I reviewed his imaging which shows stable pancreatic head cyst.  I discussed the differential.  Even if IPMN, I see no high risk or worrisome features and I would not recommend entertaining a completion pancreatectomy after everything he has been through.  We discussed EUS, but given that it wouldn't change our management at this time, we decided to defer. I will plan to see him back in 1 year with repeat imaging given stability demonstrated thus far.  I made a small incision in his left lower  quadrant in an attempt to get a suture granuloma, but did not extract much.  All his questions were answered and he agreed with the plan.     Arlys John A. Lissa Hoard, MD, FACS

## 2022-12-08 MED ORDER — OXYCODONE-ACETAMINOPHEN 5 MG-325 MG TABLET
1.0000 | ORAL_TABLET | Freq: Three times a day (TID) | ORAL | 0 refills | Status: DC | PRN
Start: 2022-12-08 — End: 2023-02-09

## 2022-12-12 DIAGNOSIS — K862 Cyst of pancreas: Secondary | ICD-10-CM

## 2022-12-14 ENCOUNTER — Other Ambulatory Visit: Payer: Self-pay

## 2022-12-14 ENCOUNTER — Encounter (INDEPENDENT_AMBULATORY_CARE_PROVIDER_SITE_OTHER): Payer: 59

## 2022-12-14 ENCOUNTER — Ambulatory Visit (INDEPENDENT_AMBULATORY_CARE_PROVIDER_SITE_OTHER): Payer: 59 | Admitting: Orthopaedic Surgery

## 2022-12-14 ENCOUNTER — Encounter (INDEPENDENT_AMBULATORY_CARE_PROVIDER_SITE_OTHER): Payer: Self-pay | Admitting: Orthopaedic Surgery

## 2022-12-14 VITALS — Ht 70.0 in | Wt 163.0 lb

## 2022-12-14 DIAGNOSIS — Z96642 Presence of left artificial hip joint: Secondary | ICD-10-CM

## 2022-12-14 DIAGNOSIS — Z471 Aftercare following joint replacement surgery: Secondary | ICD-10-CM

## 2022-12-14 MED ORDER — AMOXICILLIN 500 MG CAPSULE
2000.0000 mg | ORAL_CAPSULE | Freq: Once | ORAL | 0 refills | Status: AC
Start: 2022-12-14 — End: 2022-12-14

## 2022-12-14 NOTE — Progress Notes (Signed)
[facility]  Orthopaedic Progress Note        12/14/2022    Telvis Gorsuch  1945-08-08  [id number]    Date of surgery 11/03/2022 left total hip arthroplasty    SUBJECTIVE:  Patient is 6 weeks out from above surgery.  He is doing well.  He is happy with his progress.  He is not using any assistive devices.  He states that his left leg feels slightly longer than his right.  No problems with his wound.    OBJECTIVE:   Vitals:    12/14/22 1101   Weight: 73.9 kg (163 lb 0.5 oz)   Height: 1.778 m (5\' 10" )   BMI: 23.44         Gen: NAD  Lungs: non-labored breathing  CV: pulses regular rate    Left lower extremity  Neurovascularly intact.  His left leg is about a half a cm longer than the right.  No pain with internal and external rotation of his hip.  Incision well healed.    X-ray left hip shows stable implant.  No loosening or failure.  Hip is reduced.    ASSESSMENT: 77 y.o. yo male 6 weeks status post the above surgery    PLAN:   Patient is weightbear as tolerated.  Posterior hip precautions.  Reminded about antibiotic prophylaxis.  He will follow up in 6 weeks with x-rays of his left hip.    Gray Bernhardt, MD  Department of Orthopaedics

## 2022-12-23 ENCOUNTER — Other Ambulatory Visit (INDEPENDENT_AMBULATORY_CARE_PROVIDER_SITE_OTHER): Payer: Self-pay | Admitting: Internal Medicine

## 2022-12-23 DIAGNOSIS — Z978 Presence of other specified devices: Secondary | ICD-10-CM

## 2022-12-23 DIAGNOSIS — Z794 Long term (current) use of insulin: Secondary | ICD-10-CM

## 2022-12-23 NOTE — Telephone Encounter (Signed)
Pt. Left message asking if he should be concerned about a very large drop in his glucose last night.  He states he ate dinner at 6:30pm which was ribs and a baked potato and cole slaw and it jumped up to 290mg /dl about 30 minutes later and then 6 hours later at 1am, his alarm went off for a glucose of 63mg /dl and he has never had that happen before.  Please advise.    Maryruth Hancock, RN

## 2022-12-23 NOTE — Telephone Encounter (Signed)
Recommend for the patient to continue checking blood glucose.   If patient continues to have hypoglycemia after a meal for more than 2 days, recommend decreasing glipizide to 5 mg twice daily  Current dose is 7.5 mg twice daily

## 2022-12-26 NOTE — Telephone Encounter (Signed)
Patient notified.  He continues to have lows in the night and will decrease Glipizide to 5mg  BID.  He reports he saw Dr. Lissa Hoard in [place] regarding the cyst on his pancreas and has MRI and it remains the same size as last year so it will be re-evaluated in a year.    Maryruth Hancock, RN

## 2023-01-09 ENCOUNTER — Encounter (HOSPITAL_BASED_OUTPATIENT_CLINIC_OR_DEPARTMENT_OTHER): Payer: 59 | Admitting: Internal Medicine

## 2023-01-09 ENCOUNTER — Encounter (HOSPITAL_BASED_OUTPATIENT_CLINIC_OR_DEPARTMENT_OTHER): Payer: Self-pay

## 2023-01-09 ENCOUNTER — Telehealth (INDEPENDENT_AMBULATORY_CARE_PROVIDER_SITE_OTHER): Payer: Self-pay | Admitting: Internal Medicine

## 2023-01-09 DIAGNOSIS — E119 Type 2 diabetes mellitus without complications: Secondary | ICD-10-CM

## 2023-01-09 DIAGNOSIS — Z978 Presence of other specified devices: Secondary | ICD-10-CM

## 2023-01-09 MED ORDER — GLIPIZIDE 5 MG TABLET
5.0000 mg | ORAL_TABLET | Freq: Two times a day (BID) | ORAL | Status: DC
Start: 2023-01-09 — End: 2023-01-13

## 2023-01-09 NOTE — Progress Notes (Signed)
Free Style Libre CGM  Average days of CGM 14 days  Average blood glucose 131 mg/dl  GMI 1.3%     24% in range (70-180 mg/dl)    40% high (102 - 725 mg/dl)  1% very high (>366 mg/dl)    2% low (44-03 mg/dl)  <1 very low (<47 mg/dl)     Recommendation  Continue glipizide 5 mg twice daily  Dose recently decreased from 7.5 mg    Please notify patient

## 2023-01-09 NOTE — Telephone Encounter (Signed)
Patient notified via MyChart.    Albaro Deviney, RN

## 2023-01-09 NOTE — Telephone Encounter (Signed)
Free Style Libre CGM  Average days of CGM 14 days  Average blood glucose 131 mg/dl  GMI 1.3%     24% in range (70-180 mg/dl)    40% high (102 - 725 mg/dl)  1% very high (>366 mg/dl)    2% low (44-03 mg/dl)  <1 very low (<47 mg/dl)     Recommendation  Continue glipizide 5 mg twice daily  Dose recently decreased from 7.5 mg    Please notify patient

## 2023-01-10 ENCOUNTER — Other Ambulatory Visit (INDEPENDENT_AMBULATORY_CARE_PROVIDER_SITE_OTHER): Payer: Self-pay | Admitting: Internal Medicine

## 2023-01-10 MED ORDER — FREESTYLE LIBRE 3 PLUS SENSOR DEVICE
3 refills | Status: DC
Start: 2023-01-10 — End: 2023-01-16

## 2023-01-10 NOTE — Telephone Encounter (Signed)
Pt. Left message yesterday asking for a script for Libre 3+ sensors be sent to [facility] because the Dexcom sensor is adhering to the point that it blisters him when he tries to remove it.    Last scheduled appointment with you was 11/01/2022.  Currently scheduled future appointment is 05/02/2023.      Maryruth Hancock, RN  01/10/2023, 14:06\

## 2023-01-12 ENCOUNTER — Telehealth (INDEPENDENT_AMBULATORY_CARE_PROVIDER_SITE_OTHER): Payer: Self-pay | Admitting: Internal Medicine

## 2023-01-12 ENCOUNTER — Other Ambulatory Visit (INDEPENDENT_AMBULATORY_CARE_PROVIDER_SITE_OTHER): Payer: Self-pay | Admitting: Internal Medicine

## 2023-01-12 ENCOUNTER — Encounter (INDEPENDENT_AMBULATORY_CARE_PROVIDER_SITE_OTHER): Payer: Self-pay | Admitting: Internal Medicine

## 2023-01-12 ENCOUNTER — Encounter (HOSPITAL_BASED_OUTPATIENT_CLINIC_OR_DEPARTMENT_OTHER): Payer: 59 | Admitting: Internal Medicine

## 2023-01-12 DIAGNOSIS — E119 Type 2 diabetes mellitus without complications: Secondary | ICD-10-CM

## 2023-01-12 DIAGNOSIS — Z978 Presence of other specified devices: Secondary | ICD-10-CM

## 2023-01-12 NOTE — Telephone Encounter (Signed)
DexCom  Sensor  Average days of CGM 14 days  Average blood glucose 133 mg/dl  GMI 7.2%    86 % in range (70-180 mg/dl)  12 % high (536 - 644 mg/dl)  1 % very high (>034 mg/dl)  1 % low (70 -54 mg/dl)  0 % very low (<74 mg/dl)     Recommendation  - change glipizide to 5 mg XR once daily  - recommend for the pt to be more consistent with portion sizes and types of carbohydrates. He is not having significant lows.     Please notify pt

## 2023-01-12 NOTE — Telephone Encounter (Signed)
Pt. Left message stating he is still having sudden drops in glucose in the night.  Example last night at 11pm he had 3 handfuls of peanuts befroe bed at 11pm and at 2:30am low glucose alarm woke him up at 70mg /dl so he ate pumpkin pie and drank a Glucerna and at 4:30am he was 180mg /dl and at 8am he was 69mg /dl.  Dexcom download printed and scanned to provider in Careers information officer for interpretation and recommendations.    Maryruth Hancock, RN

## 2023-01-12 NOTE — Progress Notes (Signed)
DexCom  Sensor  Average days of CGM 14 days  Average blood glucose 133 mg/dl  GMI 7.2%    86 % in range (70-180 mg/dl)  12 % high (536 - 644 mg/dl)  1 % very high (>034 mg/dl)  1 % low (70 -54 mg/dl)  0 % very low (<74 mg/dl)     Recommendation  - change glipizide to 5 mg XR once daily  - recommend for the pt to be more consistent with portion sizes and types of carbohydrates. He is not having significant lows.     Please notify pt

## 2023-01-13 NOTE — Telephone Encounter (Signed)
Pt. Notified via telephone.  He tried skipping the PM Glipizide last night and it worked well.

## 2023-01-16 ENCOUNTER — Other Ambulatory Visit (INDEPENDENT_AMBULATORY_CARE_PROVIDER_SITE_OTHER): Payer: Self-pay | Admitting: Internal Medicine

## 2023-01-16 MED ORDER — FREESTYLE LIBRE 3 PLUS SENSOR DEVICE
3 refills | Status: DC
Start: 2023-01-16 — End: 2023-05-17

## 2023-01-16 MED ORDER — GLIPIZIDE ER 5 MG TABLET, EXTENDED RELEASE 24 HR
5.0000 mg | EXTENDED_RELEASE_TABLET | Freq: Every morning | ORAL | 4 refills | Status: DC
Start: 2023-01-16 — End: 2023-01-20

## 2023-01-16 NOTE — Telephone Encounter (Signed)
Last scheduled appointment with you was 11/01/2022.  Currently scheduled future appointment is 05/02/2023.      Maryruth Hancock, RN  01/16/2023, 10:56

## 2023-01-20 ENCOUNTER — Other Ambulatory Visit (INDEPENDENT_AMBULATORY_CARE_PROVIDER_SITE_OTHER): Payer: Self-pay | Admitting: Internal Medicine

## 2023-01-20 ENCOUNTER — Telehealth (INDEPENDENT_AMBULATORY_CARE_PROVIDER_SITE_OTHER): Payer: Self-pay | Admitting: Internal Medicine

## 2023-01-20 ENCOUNTER — Encounter (INDEPENDENT_AMBULATORY_CARE_PROVIDER_SITE_OTHER): Payer: Self-pay

## 2023-01-20 DIAGNOSIS — E1165 Type 2 diabetes mellitus with hyperglycemia: Secondary | ICD-10-CM

## 2023-01-20 DIAGNOSIS — Z794 Long term (current) use of insulin: Secondary | ICD-10-CM

## 2023-01-20 DIAGNOSIS — Z978 Presence of other specified devices: Secondary | ICD-10-CM

## 2023-01-20 MED ORDER — BLOOD-GLUCOSE METER
0 refills | Status: DC
Start: 2023-01-20 — End: 2023-08-09

## 2023-01-20 MED ORDER — ACCU-CHEK GUIDE TEST STRIPS
ORAL_STRIP | 3 refills | Status: DC
Start: 2023-01-20 — End: 2023-08-09

## 2023-01-20 MED ORDER — GLIPIZIDE 5 MG TABLET
ORAL_TABLET | ORAL | 1 refills | Status: DC
Start: 2023-01-20 — End: 2023-05-05

## 2023-01-20 MED ORDER — LANCETS
3 refills | Status: DC
Start: 2023-01-20 — End: 2023-08-09

## 2023-01-20 MED ORDER — GLIPIZIDE 2.5 MG TABLET
ORAL_TABLET | ORAL | 1 refills | Status: DC
Start: 2023-01-20 — End: 2023-05-05

## 2023-01-20 NOTE — Telephone Encounter (Signed)
I received a denial from the [place] for Dexcom.  He is no longer injecting insulin so Dexcom will no longer be covered.    Do you want me to order a glucometer and testing supplies?  I don't see that he has one on his med list.    Weber Cooks, RN

## 2023-01-20 NOTE — Telephone Encounter (Signed)
Please order immediate release glipizide 5 mg in the morning and 2.5 mg in the evening

## 2023-01-20 NOTE — Telephone Encounter (Signed)
Yes please. Thank you for noticing that

## 2023-01-20 NOTE — Telephone Encounter (Signed)
Last scheduled appointment with you was 11/01/2022.  Currently scheduled future appointment is 05/02/2023.      Weber Cooks, RN  01/20/2023, 10:40

## 2023-01-20 NOTE — Telephone Encounter (Signed)
[facility] denied glipizide.    They will cover:    Metformin, Jardiance, Actos or Januvia.  These have to be sent as generic.    Denial scanned to media.    Weber Cooks, RN

## 2023-01-20 NOTE — Telephone Encounter (Signed)
Last scheduled appointment with you was 11/01/2022.  Currently scheduled future appointment is 05/02/2023.      Weber Cooks, RN  01/20/2023, 10:18

## 2023-01-30 ENCOUNTER — Ambulatory Visit (INDEPENDENT_AMBULATORY_CARE_PROVIDER_SITE_OTHER): Payer: 59 | Admitting: Orthopaedic Surgery

## 2023-01-30 ENCOUNTER — Encounter (INDEPENDENT_AMBULATORY_CARE_PROVIDER_SITE_OTHER): Payer: Self-pay | Admitting: Orthopaedic Surgery

## 2023-01-30 ENCOUNTER — Encounter (INDEPENDENT_AMBULATORY_CARE_PROVIDER_SITE_OTHER): Payer: 59

## 2023-01-30 ENCOUNTER — Other Ambulatory Visit: Payer: Self-pay

## 2023-01-30 VITALS — BP 120/78 | HR 81 | Ht 70.0 in | Wt 162.9 lb

## 2023-01-30 DIAGNOSIS — Z96642 Presence of left artificial hip joint: Secondary | ICD-10-CM

## 2023-01-30 DIAGNOSIS — Z471 Aftercare following joint replacement surgery: Secondary | ICD-10-CM

## 2023-01-30 NOTE — Progress Notes (Signed)
[facility]  Orthopaedic Progress Note        01/30/2023    [name]  [birth year]  [id number]    Date of surgery 11/03/2022 left total hip arthroplasty    SUBJECTIVE:  Patient is 3 months out from above procedure.  He is doing well.  He states his leg lengths feel equal.  No problems with his wound.  Happy with his progress.  His hip pain and his knee pain is gone.    OBJECTIVE:   Vitals:    01/30/23 0825   BP: 120/78   Pulse: 81   SpO2: 98%   Weight: 73.9 kg (162 lb 14.7 oz)   Height: 1.778 m (5\' 10" )   BMI: 23.38         Gen: NAD  Lungs: non-labored breathing  CV: pulses regular rate    Left lower extremity  Neurovascularly intact.  Clinically his leg lengths feel equal.  He has good internal and external rotation of his hip.  No pain.  Incision well healed.    X-ray left hip shows stable implant.  No loosening or failure.  Hip is reduced.    ASSESSMENT: 77 y.o. yo male 12 weeks status post the above surgery    PLAN:   Patient is weightbear as tolerated.  He is reminded about antibiotic prophylaxis.  He will follow up on as-needed basis.  Patient agreeable to plan.    Gray Bernhardt, MD  Department of Orthopaedics

## 2023-02-03 ENCOUNTER — Other Ambulatory Visit: Payer: Self-pay

## 2023-02-03 ENCOUNTER — Ambulatory Visit: Payer: Medicare Other | Attending: Family Medicine

## 2023-02-03 DIAGNOSIS — B20 Human immunodeficiency virus [HIV] disease: Secondary | ICD-10-CM | POA: Insufficient documentation

## 2023-02-03 LAB — COMPREHENSIVE METABOLIC PANEL, NON-FASTING
ALBUMIN: 4.6 g/dL (ref 3.5–5.0)
ALKALINE PHOSPHATASE: 93 U/L (ref 38–126)
ALT (SGPT): 20 U/L (ref <=50)
ANION GAP: 9 mmol/L
AST (SGOT): 24 U/L (ref 17–59)
BILIRUBIN TOTAL: 0.4 mg/dL (ref 0.2–1.3)
BUN/CREA RATIO: 17
BUN: 19 mg/dL (ref 9–20)
CALCIUM: 9.7 mg/dL (ref 8.4–10.2)
CHLORIDE: 102 mmol/L (ref 98–107)
CO2 TOTAL: 28 mmol/L (ref 22–30)
CREATININE: 1.13 mg/dL (ref 0.66–1.25)
ESTIMATED GFR: 67 mL/min/{1.73_m2} (ref >=60)
GLUCOSE: 124 mg/dL — ABNORMAL HIGH (ref 74–106)
POTASSIUM: 3.9 mmol/L (ref 3.5–5.1)
PROTEIN TOTAL: 8.1 g/dL (ref 6.3–8.2)
SODIUM: 139 mmol/L (ref 137–145)

## 2023-02-03 LAB — MANUAL DIFF AND MORPHOLOGY-SYSMEX
BASOPHIL #: <0.1 10*3/uL (ref <=0.20)
BASOPHIL %: 0 %
EOSINOPHIL #: <0.1 10*3/uL (ref <=0.50)
EOSINOPHIL %: 0 %
LYMPHOCYTE #: 7.81 10*3/uL — ABNORMAL HIGH (ref 1.00–4.80)
LYMPHOCYTE %: 57 %
MONOCYTE #: 1.23 10*3/uL — ABNORMAL HIGH (ref 0.20–1.10)
MONOCYTE %: 9 %
NEUTROPHIL #: 4.66 10*3/uL (ref 1.50–7.70)
NEUTROPHIL %: 34 %
RBC MORPHOLOGY: NORMAL

## 2023-02-03 LAB — LIPID PANEL
CHOLESTEROL: 271 mg/dL — ABNORMAL HIGH (ref 0–199)
HDL CHOL: 38 mg/dL — ABNORMAL LOW (ref 40–60)
TRIGLYCERIDES: 722 mg/dL — ABNORMAL HIGH (ref 0–149)

## 2023-02-03 LAB — CBC WITH DIFF
HCT: 44.6 % (ref 38.9–52.0)
HGB: 14.9 g/dL (ref 13.4–17.5)
MCH: 31.4 pg (ref 26.0–32.0)
MCHC: 33.4 g/dL (ref 31.0–35.5)
MCV: 93.9 fL (ref 78.0–100.0)
MPV: 11.9 fL (ref 8.7–12.5)
PLATELETS: 263 10*3/uL (ref 150–400)
RBC: 4.75 10*6/uL (ref 4.50–6.10)
RDW-CV: 15.5 % (ref 11.5–15.5)
WBC: 13.7 10*3/uL — ABNORMAL HIGH (ref 3.7–11.0)

## 2023-02-03 LAB — LDL CHOLESTEROL, DIRECT: LDL DIRECT: 105 mg/dL — ABNORMAL HIGH (ref <100)

## 2023-02-03 NOTE — Ancillary Notes (Signed)
[name]    Venipuncture performed in office on left arm antecubital vein, dry pressure dressing was applied to site and patient tolerated it well.  Specimen was centrifuged, aliquoted as needed and specimen was labeled and packaged for transport.    [name]  02/03/2023, 15:02

## 2023-02-03 NOTE — Ancillary Notes (Signed)
CCM MOV    Unsuccessful venipuncture performed in office on right arm antecubital vein, dry pressure dressing was applied to site and patient tolerated it well.        [name]  02/03/2023, 15:18

## 2023-02-06 LAB — SYPHILIS, RAPID PLASMIN REAGIN (RPR), WITH TITER REFLEX: RPR QUALITATIVE: NONREACTIVE

## 2023-02-07 LAB — CD4/CD8
CD3 # (T CELL): 5279 {cells}/uL — ABNORMAL HIGH (ref 892–2436)
CD3 % (T CELL): 68 %
CD4 # (HELPER T CELL): 1709 {cells}/uL — ABNORMAL HIGH (ref 382–1614)
CD4 % (HELPER T CELL): 22 %
CD4:CD8 RATIO: 0.5 — ABNORMAL LOW (ref 1.0–3.4)
CD8 # (SUPPRESSOR T CELL): 3712 {cells}/uL — ABNORMAL HIGH (ref 157–813)
CD8 % (SUPPRESSOR T CELL): 48 %

## 2023-02-07 LAB — HIV-1 RNA QUANTITATIVE PCR, PLASMA: HIV1 RNA VIRAL LOAD: NOT DETECTED

## 2023-02-09 ENCOUNTER — Other Ambulatory Visit: Payer: Self-pay

## 2023-02-09 ENCOUNTER — Ambulatory Visit (INDEPENDENT_AMBULATORY_CARE_PROVIDER_SITE_OTHER): Payer: 59 | Admitting: PHYSICIAN ASSISTANT

## 2023-02-09 ENCOUNTER — Encounter (INDEPENDENT_AMBULATORY_CARE_PROVIDER_SITE_OTHER): Payer: Self-pay | Admitting: PHYSICIAN ASSISTANT

## 2023-02-09 VITALS — BP 115/74 | HR 70 | Resp 18

## 2023-02-09 DIAGNOSIS — B009 Herpesviral infection, unspecified: Secondary | ICD-10-CM

## 2023-02-09 DIAGNOSIS — B20 Human immunodeficiency virus [HIV] disease: Secondary | ICD-10-CM

## 2023-02-09 MED ORDER — VALACYCLOVIR 1 GRAM TABLET
1000.0000 mg | ORAL_TABLET | Freq: Two times a day (BID) | ORAL | 0 refills | Status: AC
Start: 2023-02-09 — End: 2023-02-16

## 2023-02-09 NOTE — Progress Notes (Signed)
Return Patient Visit         CHIEF COMPLAINT: HIV Follow-up       Nursing Notes:   Oneida Arenas  02/09/23 1112  Signed  Follow up visit for HIV. Patient is currently taking Genvoya.   History of HSV breakouts, takes Valtrex when occurs. He did have one breakout since last visit.   Updated lab work has been completed 02/03/23 for review.   Had Left THA 10/3/204, no complications per patient.        HPI: [name] is a 78 y.o. male who follows up with outpatient infectious disease for the diagnosis of HIV. HIV infection History of MRSA bacteremia secondary skin and soft tissue infection. Patient is on Genvoya, stable HIV VL and CD4 count. 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 He gets back and hip injections. 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 Reports following with a specialist through the [place] in [place] for pain management, Dr. Hettie Holstein.     PCP: Dr. Marin Shutter. On Lipitor. 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     REVIEW OF SYSTEMS:  Negative except as noted in HPI    MEDICATIONS:  Current Outpatient Medications   Medication Sig    acetaminophen (TYLENOL) 325 mg Oral Tablet Take 2 Tablets (650 mg total) by mouth Every 6 hours as needed for Pain    amLODIPine (NORVASC) 10 mg Oral Tablet Take 1 Tablet (10 mg total) by mouth Once a day    atorvastatin (LIPITOR) 10 mg Oral Tablet Take 1 Tablet (10 mg total) by mouth Every evening    Blood Sugar Diagnostic (ACCU-CHEK GUIDE TEST STRIPS) Does not apply Strip Use 1 strip daily to monitor blood glucose    Blood-Glucose Meter (ACCU-CHEK GUIDE ME GLUCOSE MTR) Misc Use daily to monitor blood glucose    Blood-Glucose Meter,Continuous (DEXCOM G7 RECEIVER) Does not apply Misc Use with sensor    Blood-Glucose Sensor (FREESTYLE LIBRE 3 PLUS SENSOR) Does not apply Device Replace Every 15 days    cyanocobalamin (VITAMIN B 12) 1,000 mcg Oral Tablet  Take 1 Tablet (1,000 mcg total) by mouth Every morning    docusate sodium (COLACE) 100 mg Oral Capsule Take 1 Capsule (100 mg total) by mouth    elvitegravir-cobicistat-emtricitrabine-tenofovir alafen (GENVOYA) 150-150-200-10 mg Oral Tablet Take 1 Tablet by mouth Once a day    ferrous sulfate (FEOSOL) 325 mg (65 mg iron) Oral Tablet Take 1 Tablet (325 mg total) by mouth Once a day    gabapentin (NEURONTIN) 100 mg Oral Capsule Take 3 Capsules (300 mg total) by mouth Three times a day For shingle pain    glipiZIDE (GLUCOTROL) 2.5 mg Oral Tablet Take 1 tablet by mouth every evening    glipiZIDE (GLUCOTROL) 5 mg Oral Tablet Take 1 tablet by mouth every morning    Lancets (ACCU-CHEK SOFTCLIX LANCETS) Misc Use 1 each daily to monitor blood glucose    levothyroxine (SYNTHROID) 88 mcg Oral Tablet Take 75 mcg by mouth Every morning    melatonin 3 mg Oral Tablet Take 10 mg by mouth Every night    MULTIVITAMIN ORAL Take 1 Tablet by mouth Once a day    oxyCODONE (OXY IR) 5 mg Oral Capsule Take 1 Capsule (5 mg total) by mouth Twice daily    pantoprazole (PROTONIX) 40 mg Oral Tablet, Delayed Release (E.C.) Take 1 Tablet (40 mg total) by mouth Once a day    sennosides-docusate sodium (  SENOKOT-S) 8.6-50 mg Oral Tablet Take 1 Tablet by mouth Every evening    valACYclovir (VALTREX) 1 gram Oral Tablet Take 1 Tablet (1 g total) by mouth Twice daily for 7 days       PHYSICAL EXAM:  Constitutional: Patient appears stated age.  Eyes: No scleral icterus.   Neck: Neck supple.   Cardiovascular: Normal rate, regular rhythm and normal heart sounds.    Pulmonary/Chest: Effort normal and breath sounds normal. No wheezes. Has no rhonchi. Has no rales.   Abdominal: Soft. Bowel sounds are normal. No distension. There is no tenderness.   Musculoskeletal: Exhibits no edema, tenderness or deformity.   Lymphadenopathy: No enlarged lymph nodes or tenderness present.  Skin: Skin is warm and dry. No rash noted. No erythema.          VITALS:  Vitals:     02/09/23 1110   BP: 115/74   Pulse: 70   Resp: 18   SpO2: 97%        HIV VIRAL LOAD    HIV1 RNA VIRAL LOAD   Date Value Ref Range Status   02/03/2023 Target Not Detected Target Not Detected Final       CD4 COUNT CD4 # (HELPER T CELL)   Date Value Ref Range Status   02/03/2023 1,709 (H) 382 - 1,614 cells/uL Final       TB GOLD No results found for this or any previous visit (from the past 8765 hour(s)).    LIPID Lab Results   Component Value Date    CHOLESTEROL 271 (H) 02/03/2023    HDLCHOL 38 (L) 02/03/2023    LDLCHOL  02/03/2023      Comment:      CALCULATION FOR LDL/VLDL-CHOLESTEROL IS INACCURATE WHEN  TRIGLYCERIDE RESULT IS GREATER THAN 400 mg/dL    LDLCHOLDIR 235 (H) 57/32/2025    TRIG 722 (H) 02/03/2023        CMP COMPREHENSIVE METABOLIC PANEL   Lab Results   Component Value Date    SODIUM 139 02/03/2023    POTASSIUM 3.9 02/03/2023    CHLORIDE 102 02/03/2023    CO2 28 02/03/2023    ANIONGAP 9 02/03/2023    BUN 19 02/03/2023    CREATININE 1.13 02/03/2023    GLUCOSENF 124 (H) 02/03/2023    CALCIUM 9.7 02/03/2023    PHOSPHORUS 3.2 05/05/2022    ALBUMIN 4.6 02/03/2023    TOTALPROTEIN 8.1 02/03/2023    ALKPHOS 93 02/03/2023    AST 24 02/03/2023    ALT 20 02/03/2023    BILIRUBINCON 0.1 06/24/2021                RPR   RPR QUALITATIVE   Date Value Ref Range Status   02/03/2023 Nonreactive Nonreactive Final      No results found for this or any previous visit (from the past 8765 hour(s)).        Health Maintenance Due   Topic Date Due    Diabetic Retinal Exam  Never done    Osteoporosis screening  Never done    Adult Tdap-Td (1 - Tdap) Never done    Hepatitis A Vaccine (1 of 2 - Risk 2-dose series) Never done    Medicare Annual Wellness Visit  Never done    Shingles Vaccine (2 of 2) 04/24/2018    RSV Adult 60+ or Pregnancy (1 - 1-dose 75+ series) Never done    Meningococcal B Vaccine (2 of 4 - Increased Risk Bexsero 2-dose series) 04/01/2021  Meningococcal Vaccine (2 - Risk 2-dose series) 04/29/2021    Pneumococcal  Vaccination, Age 54+ (2 of 2 - PPSV23) 03/04/2022    Diabetic Kidney Health Microalb/Cr Ratio  09/07/2022        Immunization History   Administered Date(s) Administered    Covid-19 Vaccine,Moderna Bivalent 01/07/2021    Covid-19 Vaccine,Moderna(Spikevax),54mcg/0.5mL,12 yr+,Seasonal 11/09/2021    Covid-19 Vaccine,Pfizer(Comirnaty),65mcg/0.3ml,12 yr+,Seasonal 11/24/2022    HAEMOPHILUS B CONJUGATE VACCINE 03/04/2021    HEPATITIS B VIRUS RECOMB VACCINE 10/11/2013, 11/18/2013, 02/18/2014    High-Dose Influenza Vaccine, 65+ 11/06/2018, 10/29/2021    Influenza Vaccine, 6 month-adult 11/18/2013, 10/27/2014, 10/16/2015, 10/17/2016, 11/03/2017    Meningococcal B Vaccine - 2 Dose 03/04/2021    Meningococcal Vaccine 03/04/2021    PREVNAR 13 12/14/2012, 01/14/2014    Shingrix - Zoster Vaccine 02/27/2018    VAXNEUVANCE 03/04/2021                     ASSESSMENT/PLAN    ICD-10-CM    1. Human immunodeficiency virus (HIV) disease (CMS HCC)  B20 CBC/DIFF     COMPREHENSIVE METABOLIC PANEL, NON-FASTING     HIV-1 RNA QUANTITATIVE PCR, PLASMA     CD4/CD8     QUANTIFERON TB GOLD PLUS, BLOOD     SYPHILIS, RAPID PLASMIN REAGIN (RPR), WITH TITER REFLEX     LIPID PANEL      2. HSV (herpes simplex virus) infection  B00.9 valACYclovir (VALTREX) 1 gram Oral Tablet      On Genvoya, tolerating well. [facility]  Triglycerides are elevated, patient s/p partial pancreatectomy, follows with local endocrinology  HIV VL not detected, CD4 count within normal limits.   Will send Rx for Valtrex, patient gets 1-2 flares per year and will treat with Valtrex during those times.   Repeat labs in 6 months  Return to clinic 6 months       PATIENT INSTRUCTIONS:  There are no Patient Instructions on file for this visit.    FOLLOW UP  Return in about 6 months (around 08/09/2023) for follow-up HIV.    Odette Fraction, PA

## 2023-02-09 NOTE — Nursing Note (Signed)
Follow up visit for HIV. Patient is currently taking Genvoya.   History of HSV breakouts, takes Valtrex when occurs. He did have one breakout since last visit.   Updated lab work has been completed 02/03/23 for review.   Had Left THA 10/3/204, no complications per patient.

## 2023-02-13 ENCOUNTER — Encounter (INDEPENDENT_AMBULATORY_CARE_PROVIDER_SITE_OTHER): Payer: Self-pay | Admitting: PHYSICIAN ASSISTANT

## 2023-02-16 ENCOUNTER — Telehealth (INDEPENDENT_AMBULATORY_CARE_PROVIDER_SITE_OTHER): Payer: Self-pay | Admitting: Internal Medicine

## 2023-02-16 ENCOUNTER — Encounter (INDEPENDENT_AMBULATORY_CARE_PROVIDER_SITE_OTHER): Payer: Self-pay

## 2023-02-16 NOTE — Telephone Encounter (Signed)
Letter for CGM written

## 2023-02-21 ENCOUNTER — Encounter (INDEPENDENT_AMBULATORY_CARE_PROVIDER_SITE_OTHER): Payer: Self-pay

## 2023-02-21 ENCOUNTER — Telehealth (INDEPENDENT_AMBULATORY_CARE_PROVIDER_SITE_OTHER): Payer: Self-pay | Admitting: Internal Medicine

## 2023-02-21 NOTE — Telephone Encounter (Signed)
Received voicemail from [place] at the [facility] in the [facility] in [place] indicating that there are no exceptions to the criteria for CGMS coverage - patient must be on daily insulin to qualify.

## 2023-02-24 ENCOUNTER — Encounter (INDEPENDENT_AMBULATORY_CARE_PROVIDER_SITE_OTHER): Payer: Self-pay

## 2023-04-26 ENCOUNTER — Ambulatory Visit: Attending: Internal Medicine

## 2023-04-26 ENCOUNTER — Other Ambulatory Visit: Payer: Self-pay

## 2023-04-26 DIAGNOSIS — Z794 Long term (current) use of insulin: Secondary | ICD-10-CM | POA: Insufficient documentation

## 2023-04-26 DIAGNOSIS — Z978 Presence of other specified devices: Secondary | ICD-10-CM | POA: Insufficient documentation

## 2023-04-26 DIAGNOSIS — E1165 Type 2 diabetes mellitus with hyperglycemia: Secondary | ICD-10-CM | POA: Insufficient documentation

## 2023-04-27 ENCOUNTER — Telehealth (INDEPENDENT_AMBULATORY_CARE_PROVIDER_SITE_OTHER): Payer: Self-pay | Admitting: Internal Medicine

## 2023-04-27 LAB — C-PEPTIDE: C-PEPTIDE: 2.7 ng/mL (ref 1.4–6.8)

## 2023-04-27 NOTE — Telephone Encounter (Signed)
 Will discuss C-peptide levels during upcoming office visit

## 2023-05-01 ENCOUNTER — Other Ambulatory Visit (INDEPENDENT_AMBULATORY_CARE_PROVIDER_SITE_OTHER): Payer: Self-pay | Admitting: INFECTIOUS DISEASE

## 2023-05-01 NOTE — Telephone Encounter (Signed)
 Last visit with PCP was Visit date not found.    Last office visit in the department was Visit date not found .  Currently scheduled future appointment in the department is 08/09/2023.    Nereida Banning, LPN, 4/54/0981 , 14:30     [facility]

## 2023-05-02 ENCOUNTER — Ambulatory Visit (INDEPENDENT_AMBULATORY_CARE_PROVIDER_SITE_OTHER): Payer: Self-pay | Admitting: Internal Medicine

## 2023-05-02 MED ORDER — GENVOYA 150 MG-150 MG-200 MG-10 MG TABLET: 1.0000 | ORAL_TABLET | Freq: Every day | ORAL | 1 refills | Status: DC

## 2023-05-02 NOTE — Nursing Note (Signed)
 Received refill request from patient's pharmacy. His last apt 02/09/23, and his next scheduled apt is 08/09/23. Verified refill appropriate and sent to pharmacy per protocol.

## 2023-05-04 ENCOUNTER — Other Ambulatory Visit: Payer: Self-pay

## 2023-05-05 ENCOUNTER — Other Ambulatory Visit: Payer: Self-pay

## 2023-05-05 ENCOUNTER — Ambulatory Visit: Payer: Self-pay | Attending: Internal Medicine | Admitting: Internal Medicine

## 2023-05-05 ENCOUNTER — Encounter (INDEPENDENT_AMBULATORY_CARE_PROVIDER_SITE_OTHER): Payer: Self-pay | Admitting: Internal Medicine

## 2023-05-05 VITALS — BP 116/68 | Ht 70.0 in | Wt 173.3 lb

## 2023-05-05 DIAGNOSIS — Z794 Long term (current) use of insulin: Secondary | ICD-10-CM | POA: Insufficient documentation

## 2023-05-05 DIAGNOSIS — Z713 Dietary counseling and surveillance: Secondary | ICD-10-CM | POA: Insufficient documentation

## 2023-05-05 DIAGNOSIS — M818 Other osteoporosis without current pathological fracture: Secondary | ICD-10-CM | POA: Insufficient documentation

## 2023-05-05 DIAGNOSIS — E782 Mixed hyperlipidemia: Secondary | ICD-10-CM | POA: Insufficient documentation

## 2023-05-05 DIAGNOSIS — E1165 Type 2 diabetes mellitus with hyperglycemia: Secondary | ICD-10-CM | POA: Insufficient documentation

## 2023-05-05 DIAGNOSIS — Z978 Presence of other specified devices: Secondary | ICD-10-CM | POA: Insufficient documentation

## 2023-05-05 LAB — POC BLOOD GLUCOSE (RESULTS): GLUCOSE, POC: 111 mg/dL (ref 80–130)

## 2023-05-05 LAB — POC HEMOGLOBIN A1C (RESULTS): HEMOGLOBIN A1C, POC: 6.6 % — ABNORMAL HIGH

## 2023-05-05 MED ORDER — ATORVASTATIN 10 MG TABLET
20.0000 mg | ORAL_TABLET | Freq: Every evening | ORAL | 3 refills | Status: DC
Start: 2023-05-05 — End: 2023-08-14

## 2023-05-05 MED ORDER — GLIPIZIDE 5 MG TABLET
ORAL_TABLET | ORAL | 1 refills | Status: DC
Start: 2023-05-05 — End: 2023-11-10

## 2023-05-05 NOTE — Progress Notes (Signed)
 [facility]  [address] [place], [place] [address]  Dept: [phone]  Fax: [phone]    ENDOCRINOLOGY OUTPATIENT PROGRESS NOTE    Name: [name]  Age: 78 y.o., Sex: Male  MRN: [id number]    Referred by: [facility]  Reason for Consult: Diabetes Mellitus    HISTORY OF PRESENT ILLNESS:     [name] is a 78 y.o. male  who is here for his visit for diabetes.   Established care for osteoporosis.       Pertinent Diabetes History    Known history of type 2 diabetes since February of 2023  Positive history for HIV on HAART Therapy    DM was diagnosed when patient had a pancreatic mass preoperatively  Patient was placed on insulin  during his hospital admission. A1c 9.3%  Following his discharge, patient stopped taking all insulin . A1c and glucose levels improved on own.   Suspecting DM related to type 2 rather than pancreatic insufficiency    C-peptide levels in July of 2024 11.8  Repeat C-peptide in March of 2025 2.7    A1c 6.6 %    Current Regimen:   Glipizide  5 mg twice daily      Pertinent Osteoporosis History    S/p iv reclast  in aug 2024 (Dose 2)    Known history of compression fracture since early 2023.  Atraumatic    Positive for prostate cancer.  On surveillance  Positive for HIV.  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  Type 2 diabetes  Benign pancreatic mass status post distal pancreatectomy    Past Family History  Mother with cancer and heart disease    Past Surgical History  Distal pancreatectomy    Social History   No history of smoking, alcohol use or illicit drug use      Allergies  No Known Allergies     Medication History     Current Outpatient Medications   Medication Sig    acetaminophen  (TYLENOL )  325 mg Oral Tablet Take 2 Tablets (650 mg total) by mouth Every 6 hours as needed for Pain    amLODIPine  (NORVASC ) 10 mg Oral Tablet Take 1 Tablet (10 mg total) by mouth Daily    atorvastatin  (LIPITOR) 10 mg Oral Tablet Take 2 Tablets (20 mg total) by mouth Every evening    Blood Sugar Diagnostic (ACCU-CHEK GUIDE TEST STRIPS) Does not apply Strip Use 1 strip daily to monitor blood glucose    Blood-Glucose Meter (ACCU-CHEK GUIDE ME GLUCOSE MTR) Misc Use daily to monitor blood glucose    Blood-Glucose Sensor (FREESTYLE LIBRE 3 PLUS SENSOR) Does not apply Device Replace Every 15 days    buprenorphine 5 mcg/hour Transdermal Patch Weekly Place 1 Patch on the skin Every 7 days    cyanocobalamin  (VITAMIN B 12) 1,000 mcg Oral Tablet Take 1 Tablet (1,000 mcg total) by mouth Every morning    docusate sodium  (COLACE) 100 mg Oral Capsule Take 1 Capsule (100 mg total) by mouth    elvitegravir-cobicistat-emtricitrabine-tenofovir alafen (GENVOYA ) 150-150-200-10 mg Oral Tablet Take 1 Tablet by mouth Once a day    ferrous sulfate  (FEOSOL) 325 mg (65 mg iron ) Oral Tablet Take 1 Tablet (325  mg total) by mouth Once a day    gabapentin  (NEURONTIN ) 100 mg Oral Capsule Take 3 Capsules (300 mg total) by mouth Three times a day For shingle pain    glipiZIDE  (GLUCOTROL ) 5 mg Oral Tablet 5 mg twice daily    Lancets (ACCU-CHEK SOFTCLIX LANCETS) Misc Use 1 each daily to monitor blood glucose    levothyroxine  (SYNTHROID ) 88 mcg Oral Tablet Take 1 Tablet (88 mcg total) by mouth Every morning    melatonin 3 mg Oral Tablet Take 10 mg by mouth Every night    MULTIVITAMIN ORAL Take 1 Tablet by mouth Once a day    pantoprazole  (PROTONIX ) 40 mg Oral Tablet, Delayed Release (E.C.) Take 1 Tablet (40 mg total) by mouth Daily    sennosides-docusate sodium  (SENOKOT-S) 8.6-50 mg Oral Tablet Take 1 Tablet by mouth Every evening        OBJECTIVE:     BP 116/68   Ht 1.778 m (5\' 10" )   Wt 78.6 kg (173 lb 4.5 oz)   BMI 24.86 kg/m        Physical  Exam  Constitutional:       General: He is not in acute distress.     Appearance: He is not ill-appearing.   Pulmonary:      Effort: Pulmonary effort is normal. No respiratory distress.   Neurological:      Mental Status: He is alert and oriented to person, place, and time. Mental status is at baseline.   Psychiatric:         Mood and Affect: Mood normal.         Behavior: Behavior normal.         Thought Content: Thought content normal.         Judgment: Judgment normal.       Lab Investigations and Imaging  I have personally reviewed and interpreted all pertinent labs and imaging    BASIC METABOLIC PANEL  Lab Results   Component Value Date    SODIUM 139 02/03/2023    POTASSIUM 3.9 02/03/2023    CHLORIDE 102 02/03/2023    CO2 28 02/03/2023    ANIONGAP 9 02/03/2023    BUN 19 02/03/2023    CREATININE 1.13 02/03/2023    BUNCRRATIO 17 02/03/2023    GFR 67 02/03/2023    CALCIUM  9.7 02/03/2023    GLUCOSENF 124 (H) 02/03/2023        ASSESSMENT & PLAN:       ICD-10-CM    1. Type 2 diabetes mellitus with hyperglycemia, with long-term current use of insulin  (CMS HCC)  E11.65 POCT Blood Glucose/Fingerstick (AMB)    Z79.4 POCT HGB A1C     buprenorphine 5 mcg/hour Transdermal Patch Weekly     POC BLOOD GLUCOSE (RESULTS)     POC HEMOGLOBIN A1C (RESULTS)     ALBUMIN      CALCIUM      CREATININE WITH EGFR     VITAMIN D  25 TOTAL     C-PEPTIDE     glipiZIDE  (GLUCOTROL ) 5 mg Oral Tablet     LIPID PANEL     MICROALBUMIN/CREATININE RATIO, URINE, RANDOM     BONE DENSITOMETRY COMPARISON     atorvastatin  (LIPITOR) 10 mg Oral Tablet     95251 - INTERPRETATION/REPORT CONTINUOUS GLUCOSE MONITORING VIA SUBCUTANEOUS SENSOR (AMB ONLY)      2. Uses self-applied continuous glucose monitoring device  Z97.8 POCT Blood Glucose/Fingerstick (AMB)     POCT HGB A1C     buprenorphine 5  mcg/hour Transdermal Patch Weekly     POC BLOOD GLUCOSE (RESULTS)     POC HEMOGLOBIN A1C (RESULTS)     ALBUMIN      CALCIUM      CREATININE WITH EGFR     VITAMIN D  25 TOTAL      C-PEPTIDE     glipiZIDE  (GLUCOTROL ) 5 mg Oral Tablet     LIPID PANEL     MICROALBUMIN/CREATININE RATIO, URINE, RANDOM     BONE DENSITOMETRY COMPARISON     atorvastatin  (LIPITOR) 10 mg Oral Tablet     95251 - INTERPRETATION/REPORT CONTINUOUS GLUCOSE MONITORING VIA SUBCUTANEOUS SENSOR (AMB ONLY)      3. 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 Nutritional counseling  Z71.3 ALBUMIN      CALCIUM      CREATININE WITH EGFR     VITAMIN D  25 TOTAL     C-PEPTIDE     glipiZIDE  (GLUCOTROL ) 5 mg Oral Tablet     LIPID PANEL     MICROALBUMIN/CREATININE RATIO, URINE, RANDOM     BONE DENSITOMETRY COMPARISON     atorvastatin  (LIPITOR) 10 mg Oral Tablet     95251 - INTERPRETATION/REPORT CONTINUOUS GLUCOSE MONITORING VIA SUBCUTANEOUS SENSOR (AMB ONLY)        Diabetes mellitus.  A1c 6.6 %  Type 2 diabetes mellitus superimposed with hyperglycemia from HAART therapy.   Unlikely related to pancreatic insufficiency    Free Style Libre CGM  Average days of CGM 14 days  Average blood glucose  157 mg/dl  GMI 1.6%     10% in range (70-180 mg/dl)    96% high (045 - 409 mg/dl)  7% very high (>811 mg/dl)    1% low (91-47 mg/dl)  0% very low (<82 mg/dl)       Plan  - continue glipizide  5 mg twice  daily    - microalbumin creatinine ratio, lipid panel ordered for the next visit    - microalbumin creatinine ratio negative as of 2023  - due for foot exam  - encouraged to be up-to-date with retinopathy screening    - LDL elevated.  Increase Lipitor to 20 mg once daily.  Gave examples of foods that would improve HDL and LDL levels.  Recommended to avoid saturated fats  - blood pressure at goal      Osteoporosis    Risk factors: age, h/o prostate cancer, h/o HIV  Positive for atraumatic compression fractures.     Anabolic agents contraindicated.  Plan  - order IV Reclast .  Dose 3 in August of 2025  - calcium , albumin , vitamin-D, GFR prior to Reclast  infusion  - DEXA scan in 21yr    Return in about 6 months (around 11/04/2023).  The above plan of care communicated to [facility]     Thurmon [place], MD    05/05/2023  09:14

## 2023-05-17 ENCOUNTER — Other Ambulatory Visit (INDEPENDENT_AMBULATORY_CARE_PROVIDER_SITE_OTHER): Payer: Self-pay | Admitting: Internal Medicine

## 2023-05-17 MED ORDER — FREESTYLE LIBRE 3 PLUS SENSOR DEVICE
3 refills | Status: DC
Start: 2023-05-17 — End: 2023-06-19

## 2023-05-17 NOTE — Telephone Encounter (Signed)
 Patient requesting CGMS supply Rx be sent to pharmacy or [facility] of [facility] to [facility] to get coverage.    Luisa Sails, RN

## 2023-05-22 ENCOUNTER — Other Ambulatory Visit (HOSPITAL_BASED_OUTPATIENT_CLINIC_OR_DEPARTMENT_OTHER): Payer: Self-pay | Admitting: Internal Medicine

## 2023-05-23 ENCOUNTER — Telehealth (INDEPENDENT_AMBULATORY_CARE_PROVIDER_SITE_OTHER): Payer: Self-pay | Admitting: Internal Medicine

## 2023-05-23 NOTE — Telephone Encounter (Signed)
 Pt. Notified via telephone.    Maryruth Hancock, RN

## 2023-05-23 NOTE — Telephone Encounter (Signed)
 Continue 20 mg for now

## 2023-05-23 NOTE — Telephone Encounter (Signed)
 Pt. Called stating his Atorvastatin  dose has been 20mg  already for quite sometime.  Our records indicated he has been on 10mg .  Please advise.    Luisa Sails, RN

## 2023-05-25 NOTE — H&P (Addendum)
 GASTROENTEROLOGY, MEDICAL OFFICE BUILDING B  [address]  [place] [address]  [phone]    Progress Note  Name: [name]  MRN: [id number]  DOB: October 14, 1945  Age: 78 y.o.  Date: 05/29/2023    Reason for Visit: Follow Up    History of Present Illness      Patient following up for history of partial pancreatectomy, history of colon polyps with poor prep on last colonoscopy, IDA.     He previously had part of his pancreas removed, and the remaining portion is being monitored with MRI scans, the next one scheduled for November 5th.     Last colonoscopy with poor prep and colon polyps.     In terms of gastrointestinal symptoms, the patient denies any blood in his stool, dark stools, belly pains, difficulty swallowing, or issues with reflux when taking his prescribed Protonix . He reports typically having one to two bowel movements per day.     Continues to take iron  supplement once daily.     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 Has MRI scheduled 12/06/2023    Patient History:  Past Medical History:   Diagnosis Date    C. 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)     Hx of transfusion     Hyperlipidemia     "borderline"    Hypothyroidism     MRSA  (methicillin resistant staph aureus) culture positive 01/02/2021    MRSA left groin abscess 01/03/21    MRSA (methicillin resistant staph aureus) culture positive 01/02/2021    MRSA blood 01/02/21    Pulmonary embolism 03/31/2021    Thyroid  disorder     Type 2 diabetes mellitus     Wears glasses          Past Surgical History:   Procedure Laterality Date    COLON SURGERY      per patient had part of colon removed and had a colostomy    COLONOSCOPY      GASTROSCOPY  06/24/2021    Dr.Thakur    HIP SURGERY Right 04/22/2020    Right hip arthrotomy 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 Sue Em fo rMRSA. 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>    HX CERVICAL SPINE SURGERY  2001    no hardware    HX COLOSTOMY REVERSAL      HX HERNIA REPAIR Right     inguinal hernia    HX HIP SURGERY Left 11/03/2022    Left total hip arthroplasty  Dr. Nira Basset LAPAROTOMY      abdominal    KNEE SURGERY Bilateral     "just cleaned up no replacements"    PANCREATECTOMY      2023, 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    SPLENECTOMY      2023    WRIST SURGERY Right          Current Outpatient Medications   Medication Sig    acetaminophen  (TYLENOL ) 325 mg Oral Tablet Take 2 Tablets (650 mg total) by mouth Every 6 hours as needed for Pain    amLODIPine  (NORVASC ) 10 mg Oral Tablet Take 1 Tablet (10 mg total) by mouth Daily    atorvastatin  (LIPITOR) 10 mg Oral Tablet Take 2 Tablets (20 mg total) by mouth Every evening    Blood Sugar Diagnostic (ACCU-CHEK GUIDE TEST STRIPS) Does not apply Strip Use 1 strip daily to monitor blood glucose    Blood-Glucose Meter (ACCU-CHEK GUIDE ME GLUCOSE MTR) Misc Use daily to monitor blood glucose    Blood-Glucose Sensor (FREESTYLE LIBRE 3 PLUS SENSOR) Does not apply Device Replace Every 15 days    buprenorphine 5 mcg/hour Transdermal Patch Weekly Place 1 Patch on the skin Every 7 days    cyanocobalamin  (VITAMIN B 12) 1,000 mcg Oral Tablet Take 1 Tablet (1,000 mcg total) by mouth Every morning    docusate sodium   (COLACE) 100 mg Oral Capsule Take 1 Capsule (100 mg total) by mouth    elvitegravir-cobicistat-emtricitrabine-tenofovir alafen (GENVOYA ) 150-150-200-10 mg Oral Tablet Take 1 Tablet by mouth Once a day    ferrous sulfate  (FEOSOL) 325 mg (65 mg iron ) Oral Tablet Take 1 Tablet (325 mg total) by mouth Once a day    gabapentin  (NEURONTIN ) 100 mg Oral Capsule Take 3 Capsules (300 mg total) by mouth Three times a day For shingle pain    glipiZIDE  (GLUCOTROL ) 5 mg Oral Tablet 5 mg twice daily    Lancets (ACCU-CHEK SOFTCLIX LANCETS) Misc Use 1 each daily to monitor blood glucose    levothyroxine  (SYNTHROID ) 88 mcg Oral Tablet Take 1 Tablet (88 mcg total) by mouth Every morning    melatonin 3 mg Oral Tablet Take 10 mg by mouth Every night    MULTIVITAMIN ORAL Take 1 Tablet by mouth Once a day    pantoprazole  (PROTONIX ) 40 mg Oral Tablet, Delayed Release (E.C.) Take 1 Tablet (40 mg total) by mouth Daily    sennosides-docusate sodium  (SENOKOT-S) 8.6-50 mg Oral Tablet Take 1 Tablet by mouth Every evening     No Known Allergies  Family Medical History:       Problem Relation (Age of Onset)    Cancer Mother, Father, Other    Heart Attack Mother    High Cholesterol Other    Stroke Other            Social History     Socioeconomic History    Marital status: Married     Spouse name: Not on file    Number of children: Not on file    Years of education: Not on file    Highest education level: Not on file   Occupational History    Not on file   Tobacco Use    Smoking status: Never    Smokeless tobacco: Never   Vaping Use    Vaping status: Never Used   Substance and Sexual Activity    Alcohol use: Not Currently    Drug use: Never    Sexual activity: Not on  file   Other Topics Concern    Calcium  intake adequate Not Asked    Computer Use Not Asked    Drives Not Asked    Exercise Concern Not Asked    Helmet Use Not Asked    Seat Belt Not Asked    Uses Cane Not Asked    Uses walker Not Asked    Uses wheelchair Not Asked    Right hand  dominant Not Asked    Left hand dominant Not Asked    Ambidextrous Not Asked    Uses Scooter Not Asked    Activity Limitations Not Asked    Ability to Walk 1 Flight of Steps without SOB/CP Yes    Routine Exercise Not Asked    Ability to Walk 2 Flight of Steps without SOB/CP No    Unable to Ambulate Not Asked    Total Care Not Asked    Ability To Do Own ADL's Yes    Uses Walker Not Asked    Other Activity Level Not Asked    Uses Cane Not Asked   Social History Narrative    Not on file     Social Determinants of Health     Financial Resource Strain: Low Risk  (05/05/2022)    Financial Resource Strain     SDOH Financial: No   Transportation Needs: Low Risk  (05/05/2022)    Transportation Needs     SDOH Transportation: No   Social Connections: High Risk (11/03/2022)    Social Connections     SDOH Social Isolation: Less than once a week   Intimate Partner Violence: Low Risk  (12/31/2021)    Intimate Partner Violence     SDOH Domestic Violence: No   Housing Stability: Low Risk  (05/05/2022)    Housing Stability     SDOH Housing Situation: I have housing.     SDOH Housing Worry: No       Review of Systems:  All other review of systems negative except noted in HPI    Physical Exam:  BP 108/62 (Patient Position: Sitting)   Ht 1.778 m (5\' 10" )   Wt 79.5 kg (175 lb 4.3 oz)   BMI 25.15 kg/m     Physical Exam  Vitals reviewed.   Constitutional:       General: He is not in acute distress.     Appearance: Normal appearance. He is not ill-appearing.   HENT:      Head: Normocephalic and atraumatic.      Nose: Nose normal.   Eyes:      Extraocular Movements: Extraocular movements intact.      Conjunctiva/sclera: Conjunctivae normal.      Pupils: Pupils are equal, round, and reactive to light.   Pulmonary:      Effort: Pulmonary effort is normal.   Musculoskeletal:         General: No swelling, deformity or signs of injury. Normal range of motion.      Cervical back: Normal range of motion.   Skin:     General: Skin is warm and dry.       Findings: No erythema.   Neurological:      General: No focal deficit present.      Mental Status: He is alert and oriented to person, place, and time. Mental status is at baseline.   Psychiatric:         Mood and Affect: Mood normal.         Behavior: Behavior normal.  Thought Content: Thought content normal.         Assessment & Plan       ICD-10-CM    1. Iron  deficiency anemia  D50.9 CBC/DIFF     IRON      FERRITIN     IRON  TRANSFERRIN AND TIBC      2. History of colon polyps  Z86.0100 CASE REQUEST SURGICAL: COLONOSCOPY         - will schedule repeat colonoscopy due to poor prep and history of colon polyps with 2-day Miralax  prep. Discussed risks of procedure which include but are not limited to possible risk of bleeding, infection, perforation, anesthesia complications, and reviewed prep instructions, patient voiced understanding and wishes to proceed.   - Repeat an MRI in November to monitor the growth of the pancreatic lesion. Continue to follow with [facility].   - Complete labwork prior to colonoscopy to review to trend anemia.   - Continue Protonix  40 mg BID for GERD.   - Continue iron  supplement daily.   - Follow-up post-procedure, sooner if needed.     Vivianne Grosser, FNP-C     This note was created using [employer] M*Modal Fluency Align mobile application via conversational audio. Consent for audio recording was obtained by patient/family members prior to recording.

## 2023-05-28 ENCOUNTER — Other Ambulatory Visit: Payer: Self-pay

## 2023-05-29 ENCOUNTER — Encounter (HOSPITAL_BASED_OUTPATIENT_CLINIC_OR_DEPARTMENT_OTHER): Payer: Self-pay | Admitting: Gastroenterology

## 2023-05-29 ENCOUNTER — Ambulatory Visit: Payer: Self-pay | Attending: Nurse Practitioner | Admitting: Nurse Practitioner

## 2023-05-29 ENCOUNTER — Encounter (HOSPITAL_BASED_OUTPATIENT_CLINIC_OR_DEPARTMENT_OTHER): Payer: Self-pay | Admitting: Nurse Practitioner

## 2023-05-29 ENCOUNTER — Other Ambulatory Visit: Payer: Self-pay

## 2023-05-29 VITALS — BP 108/62 | Ht 70.0 in | Wt 175.3 lb

## 2023-05-29 DIAGNOSIS — D509 Iron deficiency anemia, unspecified: Secondary | ICD-10-CM | POA: Insufficient documentation

## 2023-05-29 DIAGNOSIS — K219 Gastro-esophageal reflux disease without esophagitis: Secondary | ICD-10-CM

## 2023-05-29 DIAGNOSIS — Z860100 Personal history of colon polyps, unspecified: Secondary | ICD-10-CM | POA: Insufficient documentation

## 2023-05-29 NOTE — Nursing Note (Signed)
 Per scanned fax from the [place] for Colonoscopy scheduled 07/20/23 w/Dr. Lyman Sander has been approved & is valid 05/29/23 through 09/26/23. PA # [id number].    Para Bold, LPN 16/10/96 04:54

## 2023-06-19 ENCOUNTER — Other Ambulatory Visit (INDEPENDENT_AMBULATORY_CARE_PROVIDER_SITE_OTHER): Payer: Self-pay | Admitting: Internal Medicine

## 2023-06-19 MED ORDER — FREESTYLE LIBRE 3 PLUS SENSOR DEVICE
3 refills | Status: AC
Start: 2023-06-19 — End: ?

## 2023-06-19 NOTE — Telephone Encounter (Signed)
 Last scheduled appointment with you was 05/05/2023.  Currently scheduled future appointment is 11/10/2023.    Requesting local pharmacy instead of [name], he says it cheaper.    Madison Schimke, RN  06/19/2023, 07:52

## 2023-06-30 ENCOUNTER — Other Ambulatory Visit (HOSPITAL_COMMUNITY): Payer: Self-pay | Admitting: FAMILY PRACTICE

## 2023-06-30 DIAGNOSIS — M5416 Radiculopathy, lumbar region: Secondary | ICD-10-CM

## 2023-07-07 ENCOUNTER — Ambulatory Visit
Admission: RE | Admit: 2023-07-07 | Discharge: 2023-07-07 | Disposition: A | Discharge status: Home / Self Care | Source: Ambulatory Visit | Attending: FAMILY PRACTICE | Admitting: FAMILY PRACTICE

## 2023-07-07 ENCOUNTER — Other Ambulatory Visit: Payer: Self-pay

## 2023-07-07 DIAGNOSIS — M5416 Radiculopathy, lumbar region: Secondary | ICD-10-CM | POA: Insufficient documentation

## 2023-07-09 ENCOUNTER — Other Ambulatory Visit: Payer: Self-pay

## 2023-07-10 ENCOUNTER — Encounter (INDEPENDENT_AMBULATORY_CARE_PROVIDER_SITE_OTHER): Payer: Self-pay | Admitting: Orthopaedic Surgery

## 2023-07-10 ENCOUNTER — Other Ambulatory Visit: Payer: Self-pay

## 2023-07-10 ENCOUNTER — Encounter (INDEPENDENT_AMBULATORY_CARE_PROVIDER_SITE_OTHER)

## 2023-07-10 ENCOUNTER — Ambulatory Visit (INDEPENDENT_AMBULATORY_CARE_PROVIDER_SITE_OTHER): Payer: Self-pay | Admitting: Orthopaedic Surgery

## 2023-07-10 VITALS — BP 118/74 | HR 70 | Ht 70.0 in | Wt 175.6 lb

## 2023-07-10 DIAGNOSIS — Z96642 Presence of left artificial hip joint: Secondary | ICD-10-CM

## 2023-07-10 DIAGNOSIS — M5416 Radiculopathy, lumbar region: Secondary | ICD-10-CM | POA: Insufficient documentation

## 2023-07-10 DIAGNOSIS — M65341 Trigger finger, right ring finger: Secondary | ICD-10-CM | POA: Insufficient documentation

## 2023-07-10 NOTE — Progress Notes (Signed)
 [facility]  Orthopaedic Progress Note        07/10/2023    [name]  14-Sep-1945  [id number]    Date of surgery 11/03/2022 left total hip arthroplasty    SUBJECTIVE:  Patient is 3 months out from above procedure.  He is doing well.  He states his leg lengths feel equal.  No problems with his wound.  Happy with his progress.  His hip pain and his knee pain is gone.    07/10/2023 - approximately 8 months out from above procedure.  He is doing well overall.  He states that 1 leg is longer than the other.  He has been wearing a shoe lift in his right shoe to help with this.  He is wondering if the shoe lift is the right size.  He stated that he has been having a sharp pain that starts in his back goes down his leg to his knee.  He denied injury.  He reported that he has had injections in his low back previously.  He stated that he recently had an MRI of his lumbar spine.  He takes Tylenol  as needed.    OBJECTIVE:   Vitals:    07/10/23 0826   BP: 118/74   Pulse: 70   SpO2: 97%   Weight: 79.7 kg (175 lb 9.6 oz)   Height: 1.778 m (5\' 10" )   BMI: 25.2     Gen: NAD  Lungs: non-labored breathing  CV: pulses regular rate    Left lower extremity:  Neurovascularly intact.  Left leg slightly longer than the right.  He has good internal and external rotation of his hip.  No pain.     Right upper extremity:  Neurovascularly intact distally.  Active triggering noted of ring finger.  Palpable nodule of ring finger.  Tenderness of A1 pulley.    Imaging:  X-ray left hip shows stable implant.  No loosening or failure.     ASSESSMENT: 78 y.o. yo male 8 months status post the above surgery with lumbar radiculopathy and trigger finger of right ring finger    PLAN:   Patient is weightbear as tolerated.  Advised that if he feels his current shoe lift helps his leg lengths to feel even then the shoe lift is the right size.  He will follow up on as-needed basis.  Discussed referral to Neurosurgery for lumbar  radiculopathy.  Patient agreeable to referral.  Treatment options such as injections in surgical intervention for right ring trigger finger discussed.  Patient would like to hold off on treatment until the fall.  He will follow up on an as-needed basis.    Marland Silvas, PA-C  Department of Orthopaedics

## 2023-07-18 ENCOUNTER — Telehealth (INDEPENDENT_AMBULATORY_CARE_PROVIDER_SITE_OTHER): Payer: Self-pay | Admitting: INFECTIOUS DISEASE

## 2023-07-18 ENCOUNTER — Encounter (HOSPITAL_COMMUNITY): Payer: Self-pay

## 2023-07-18 ENCOUNTER — Ambulatory Visit
Admission: RE | Admit: 2023-07-18 | Discharge: 2023-07-18 | Disposition: A | Discharge status: Home / Self Care | Source: Ambulatory Visit | Attending: Gastroenterology | Admitting: Gastroenterology

## 2023-07-18 ENCOUNTER — Other Ambulatory Visit: Payer: Self-pay

## 2023-07-18 NOTE — Telephone Encounter (Signed)
 VMM:    FYI:    Patient states that he is having another outbreak of genital herpes. He was in the shower this AM and had a penile lesion. He did squeeze this open. He has a script at home for a 7 day course of valtrex  that he is going to take but questions if there is anything else that he needs to do.

## 2023-07-18 NOTE — Nursing Note (Signed)
 Information noted. Appropriate course of action per last visit note.

## 2023-07-20 ENCOUNTER — Ambulatory Visit (HOSPITAL_COMMUNITY): Admitting: Anesthesiology

## 2023-07-20 ENCOUNTER — Ambulatory Visit
Admission: RE | Admit: 2023-07-20 | Discharge: 2023-07-20 | Disposition: A | Discharge status: Home / Self Care | Source: Ambulatory Visit | Attending: Gastroenterology | Admitting: Gastroenterology

## 2023-07-20 ENCOUNTER — Encounter (HOSPITAL_COMMUNITY)
Admission: RE | Disposition: A | Discharge status: Home / Self Care | Payer: Self-pay | Source: Ambulatory Visit | Attending: Gastroenterology

## 2023-07-20 DIAGNOSIS — Z8673 Personal history of transient ischemic attack (TIA), and cerebral infarction without residual deficits: Secondary | ICD-10-CM | POA: Insufficient documentation

## 2023-07-20 DIAGNOSIS — M549 Dorsalgia, unspecified: Secondary | ICD-10-CM | POA: Insufficient documentation

## 2023-07-20 DIAGNOSIS — Z980 Intestinal bypass and anastomosis status: Secondary | ICD-10-CM | POA: Insufficient documentation

## 2023-07-20 DIAGNOSIS — Z8719 Personal history of other diseases of the digestive system: Secondary | ICD-10-CM | POA: Insufficient documentation

## 2023-07-20 DIAGNOSIS — Z1211 Encounter for screening for malignant neoplasm of colon: Secondary | ICD-10-CM | POA: Insufficient documentation

## 2023-07-20 DIAGNOSIS — Z860100 Personal history of colon polyps, unspecified: Secondary | ICD-10-CM | POA: Insufficient documentation

## 2023-07-20 DIAGNOSIS — E1122 Type 2 diabetes mellitus with diabetic chronic kidney disease: Secondary | ICD-10-CM | POA: Insufficient documentation

## 2023-07-20 DIAGNOSIS — E785 Hyperlipidemia, unspecified: Secondary | ICD-10-CM | POA: Insufficient documentation

## 2023-07-20 DIAGNOSIS — Z86711 Personal history of pulmonary embolism: Secondary | ICD-10-CM | POA: Insufficient documentation

## 2023-07-20 DIAGNOSIS — D509 Iron deficiency anemia, unspecified: Secondary | ICD-10-CM | POA: Insufficient documentation

## 2023-07-20 DIAGNOSIS — K640 First degree hemorrhoids: Secondary | ICD-10-CM | POA: Insufficient documentation

## 2023-07-20 DIAGNOSIS — E039 Hypothyroidism, unspecified: Secondary | ICD-10-CM | POA: Insufficient documentation

## 2023-07-20 DIAGNOSIS — K219 Gastro-esophageal reflux disease without esophagitis: Secondary | ICD-10-CM | POA: Insufficient documentation

## 2023-07-20 DIAGNOSIS — Z7984 Long term (current) use of oral hypoglycemic drugs: Secondary | ICD-10-CM | POA: Insufficient documentation

## 2023-07-20 DIAGNOSIS — I129 Hypertensive chronic kidney disease with stage 1 through stage 4 chronic kidney disease, or unspecified chronic kidney disease: Secondary | ICD-10-CM | POA: Insufficient documentation

## 2023-07-20 DIAGNOSIS — F32A Depression, unspecified: Secondary | ICD-10-CM | POA: Insufficient documentation

## 2023-07-20 DIAGNOSIS — B20 Human immunodeficiency virus [HIV] disease: Secondary | ICD-10-CM | POA: Insufficient documentation

## 2023-07-20 DIAGNOSIS — D120 Benign neoplasm of cecum: Secondary | ICD-10-CM | POA: Insufficient documentation

## 2023-07-20 DIAGNOSIS — N182 Chronic kidney disease, stage 2 (mild): Secondary | ICD-10-CM | POA: Insufficient documentation

## 2023-07-20 DIAGNOSIS — Z79899 Other long term (current) drug therapy: Secondary | ICD-10-CM | POA: Insufficient documentation

## 2023-07-20 SURGERY — COLONOSCOPY WITH POLYPECTOMY
Anesthesia: Monitor Anesthesia Care | Wound class: Clean Contaminated Wounds-The respiratory, GI, Genital, or urinary

## 2023-07-20 MED ORDER — LIDOCAINE HCL 20 MG/ML (2 %) INJECTION SOLUTION
Freq: Once | INTRAMUSCULAR | Status: DC | PRN
Start: 2023-07-20 — End: 2023-07-20
  Administered 2023-07-20: 50 mg via INTRAVENOUS

## 2023-07-20 MED ORDER — LACTATED RINGERS INTRAVENOUS SOLUTION
INTRAVENOUS | Status: DC | PRN
Start: 2023-07-20 — End: 2023-07-20

## 2023-07-20 MED ORDER — PROPOFOL 10 MG/ML IV BOLUS
INJECTION | Freq: Once | INTRAVENOUS | Status: DC | PRN
Start: 2023-07-20 — End: 2023-07-20
  Administered 2023-07-20: 20 mg via INTRAVENOUS
  Administered 2023-07-20: 10 mg via INTRAVENOUS
  Administered 2023-07-20: 40 mg via INTRAVENOUS
  Administered 2023-07-20: 30 mg via INTRAVENOUS
  Administered 2023-07-20: 20 mg via INTRAVENOUS
  Administered 2023-07-20: 30 mg via INTRAVENOUS
  Administered 2023-07-20: 40 mg via INTRAVENOUS

## 2023-07-20 MED ORDER — PROPOFOL 10 MG/ML INTRAVENOUS EMULSION
INTRAVENOUS | Status: AC
Start: 2023-07-20 — End: 2023-07-20
  Filled 2023-07-20: qty 20

## 2023-07-20 MED ORDER — LACTATED RINGERS INTRAVENOUS SOLUTION
1000.0000 mL | INTRAVENOUS | Status: DC
Start: 2023-07-20 — End: 2023-07-20
  Administered 2023-07-20: 1000 mL via INTRAVENOUS

## 2023-07-20 SURGICAL SUPPLY — 42 items
AMPULE LAB ELEVIEW 10ML ENDOS RESCT PROC STRL LF DISP (MED SURG SUPPLIES) IMPLANT
APPL LAPSCP 45CM VISTASEAL DL APPL FLXB (ENDOSCOPIC SUPPLIES) IMPLANT
CAP ENDOSCP SEAL 9.8-11.1MM MED RF ABLATION FLXB BARRX GIF-180 STRL DISP (ENDOSCOPIC SUPPLIES) IMPLANT
CAPSULE PH BRAVO CALIBRATE FREE RFLX DEL SYS (MED SURG SUPPLIES) IMPLANT
CAPSULE VIDEO PILLCAM PAT NONST LF  DISP (ENDOSCOPIC SUPPLIES) IMPLANT
CATH ABLATION BARRX 4MM 160CM 20X13MM 90D BIPOLAR ELECTRODE BAL FOCL RF ESPH ACPT 8.6-12.8MM SCP (ENDOSCOPIC SUPPLIES) IMPLANT
CATH ABLATION RF (CATHETERS) IMPLANT
CATH CRE 6-7-8MM 7.5FR 5.5CM 240CM 2.8MM 3.2MM LOW PROF GW BAL DIL ESOPH PYL BIL PEBAX STRL LF  DISP (ENDOSCOPIC SUPPLIES) IMPLANT
CATH ELHMST GLD PROBE 7FR 300CM BIPOLAR RND DIST TIP STD CONN FIRM SHAFT HMGLD STRL DISP 2.8MM MN (ENDOSCOPIC SUPPLIES) IMPLANT
CATH ENDOS BARRX 2.8MM 135CM FLXB CHNL RF ABLATION ESPH DISP (MED SURG SUPPLIES) IMPLANT
CATH PH MONITORING VERSAFLEX Z 6FR ZNIS+8R 8 RING DISP (MED SURG SUPPLIES) IMPLANT
CLIP HMST 2.8MM REPST DURACLIP 235CM 16MM CSCP (ENDOSCOPIC SUPPLIES) IMPLANT
CLIP HMST MR CONDITIONAL BRD CATH ROT CONTROL KNOB NO SHEATH RSL 360 235CM 2.8MM 11MM OPN (ENDOSCOPIC SUPPLIES) IMPLANT
CLIP HMST RADOPQ PRELD STRL DISP RSL 235CM 2.8MM 11MM OPN (ENDOSCOPIC SUPPLIES) IMPLANT
CLIP LGT RSL 360 ULTRA 235CM B_RD ROT CONTROL KNOB 17MM OPN (ENDOSCOPIC SUPPLIES) IMPLANT
DILATOR ENDOS CRE 180CM 8CM 10-11-12MM 6FR ESOPH BAL LOW PROF FIX WRE PEBAX STRL LF  DISP 2.8MM (ENDOSCOPIC SUPPLIES) IMPLANT
DILATOR ENDOS CRE 240CM 5.5CM 11-13.5-15MM 7.5FR ESOPH PYL BIL BAL LOW PROF GW PEBAX STRL LF  DISP (ENDOSCOPIC SUPPLIES) IMPLANT
DILATOR ENDOS CRE 240CM 5.5CM 15-16.5-18MM 7.5FR ESOPH PYL BIL BAL LOW PROF GW PEBAX STRL LF  DISP (ENDOSCOPIC SUPPLIES) IMPLANT
DILATOR ENDOS CRE 240CM 5.5CM 18-19-20MM 7.5FR ESOPH PYL BIL BAL LOW PROF GW PEBAX STRL LF  DISP 2.8 (ENDOSCOPIC SUPPLIES) IMPLANT
DILATOR ENDOS CRE 240CM 5.5CM 8-9-10MM 7.5FR ESOPH PYL BIL BAL LOW PROF GW PEBAX STRL LF  DISP 2.8MM (ENDOSCOPIC SUPPLIES) IMPLANT
DISC NO SUB - FORCEPS BIOPSY MLT SAMPLE 240CM 2.4MM MTBT 2.8MM STRL DISP ORNG (ENDOSCOPIC SUPPLIES) IMPLANT
DISCONTINUED USE ITEM 344596 - WATER STRL 500ML BTL LF (MED SURG SUPPLIES) IMPLANT
ELECTRODE ESURG 85SQ CM 4MR NESSY OMG NONST DISP RTN PLATE NEUTRALIZE CABLE PLUG LF (MED SURG SUPPLIES) IMPLANT
FORCEPS BIOPSY 160CM 1.8MM RJ 4 PED 2+ MM DISP GASTROSCOPIC (ENDOSCOPIC SUPPLIES) IMPLANT
FORCEPS ENDOS HOT PRCS BITE 24_0CM 2.8MM 2.2MM RJ 4 CUP DISP (ENDOSCOPIC SUPPLIES) IMPLANT
FORCEPS ESURG 230X4MM COAGRASPER HMST STRL LF  DISP (ENDOSCOPIC SUPPLIES) IMPLANT
GW ENDOS .038IN 260CM BARRX RFA STR DIST TIP FLXB SS STRL DISP (ENDOSCOPIC SUPPLIES) IMPLANT
GW URO 200CM SAVARY FLX TP SS NONST (ENDOSCOPIC SUPPLIES) IMPLANT
LIGATOR 2.8MM 8.6-11.5MM SSS7 ESOPH 1 STNG MLT BAND HNDL STRL DISP ENDOS HMSTS LF (ENDOSCOPIC SUPPLIES) IMPLANT
MARKER ENDOS SPOT EX PERM IND DRK SYRG 5ML (MED SURG SUPPLIES) IMPLANT
NEEDLE SCLRTX 25GA 1.8MM CLR 4MM 200CM (ENDOSCOPIC SUPPLIES) IMPLANT
NEEDLE SCLRTX 25GA 2.3MM BVL STRL DISP STAR CATH INTJCT 4MM 240CM (ENDOSCOPIC SUPPLIES) IMPLANT
PROBE COAG 7.2FT 6.9FR FIAPC CRCMF PLUG PLAY FUNCTIONALITY FILTER (SURGICAL CUTTING SUPPLIES) IMPLANT
PROBE ESURG 300CM 2.3MM FIAPC FLXB CRCMF STRL DISP (SURGICAL CUTTING SUPPLIES) IMPLANT
RETRIEVER ENDOS BLU 230CM 2.5MM RESCUENET PROMESH NT LOOP SHEATH 5.5X3CM 2.8CM MN WRK CHNL (ENDOSCOPIC SUPPLIES) IMPLANT
SET ENDOS INSTR 220CM 8.5-11MM OTSC 6MM NITINOL STRL LF  DISP (ENDOSCOPIC SUPPLIES) ×1 IMPLANT
SNARE RND 240CM 2.4MM CAPTIVATR COLD STF THN WRE ENDOS PLPCTM 10MM DISP (ENDOSCOPIC SUPPLIES) IMPLANT
SYRINGE INFLAT ALN II GA STRL DISP 60ML (ENDOSCOPIC SUPPLIES) IMPLANT
TIP APPL 40CM SS DUPLOSPRAY (MED SURG SUPPLIES) IMPLANT
USE ITEM 305700 CLIP LGT RSL 360 ULTRA 235CM B_RD ROT CONTROL KNOB 17MM OPN (ENDOSCOPIC SUPPLIES) IMPLANT
USE ITEM 343174 SNARE RND 240CM 2.4MM CAPTIVATR COLD STF THN WRE ENDOS PLPCTM 10MM DISP (ENDOSCOPIC SUPPLIES) IMPLANT
WATER STRL 500ML BTL LF (MED SURG SUPPLIES) IMPLANT

## 2023-07-20 NOTE — Anesthesia Transfer of Care (Signed)
 ANESTHESIA TRANSFER OF CARE   [name] is a 78 y.o. ,male,     had Procedure(s):  COLONOSCOPY WITH POLYPECTOMY  performed  07/20/23   Primary Service: Melynda Stagger, MD    Past Medical History:   Diagnosis Date    C. 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)     Hx of transfusion     Hyperlipidemia     borderline    Hypothyroidism     MRSA (methicillin resistant staph aureus) culture positive 01/02/2021    MRSA left groin abscess 01/03/21    MRSA (methicillin resistant staph aureus) culture positive 01/02/2021    MRSA blood 01/02/21    Pulmonary embolism 03/31/2021    Thyroid  disorder     Type 2 diabetes mellitus     Wears glasses       Allergy History as of 07/20/23        No Known Allergies                  I completed my transfer of care / handoff to the receiving personnel during which we discussed:  Access, Airway, All key/critical aspects of case discussed, Expectation of post procedure, Fluids/Product, Gave opportunity for questions and acknowledgement of understanding and PMHx      Post Location: PACU                                                             Last OR Temp: Temperature: 36.3 C (97.4 F)  ABG:  PH (ARTERIAL)   Date Value Ref Range Status   04/04/2021 7.41 7.35 - 7.45 Final     PH (T)   Date Value Ref Range Status   03/22/2021 7.36 7.35 - 7.45 Final     PCO2 (ARTERIAL)   Date Value Ref Range Status   04/04/2021 41 35 - 45 mm/Hg Final     PCO2 (VENOUS)   Date Value Ref Range Status   05/05/2021 42 41 - 51 mm/Hg Final     PO2  (ARTERIAL)   Date Value Ref Range Status   04/04/2021 77 72 - 100 mm/Hg Final     PO2 (VENOUS)   Date Value Ref Range Status   05/05/2021 58 (H) 35 - 50 mm/Hg Final     SODIUM   Date Value Ref Range Status   03/22/2021 135 (L) 137 - 145 mmol/L Final     POTASSIUM   Date Value Ref Range Status   02/03/2023 3.9 3.5 - 5.1 mmol/L Final   11/04/2022 3.3 (L) 3.5 - 5.1 mmol/L Final     KETONES  Date Value Ref Range Status   05/04/2022 Not Detected Not Detected mg/dL Final     WHOLE BLOOD POTASSIUM   Date Value Ref Range Status   03/22/2021 3.6 3.5 - 4.6 mmol/L Final     CHLORIDE   Date Value Ref Range Status   03/22/2021 106 101 - 111 mmol/L Final     CALCIUM    Date Value Ref Range Status   02/03/2023 9.7 8.4 - 10.2 mg/dL Final   10/93/2355 9.1 8.6 - 10.3 mg/dL Final     Comment:     Gadolinium-containing contrast can interfere with calcium  measurement.       Calculated P Axis   Date Value Ref Range Status   10/17/2022 47 degrees Final     Calculated R Axis   Date Value Ref Range Status   10/17/2022 8 degrees Final     Calculated T Axis   Date Value Ref Range Status   10/17/2022 45 degrees Final     IONIZED CALCIUM    Date Value Ref Range Status   05/04/2022 1.83 (HH) 1.15 - 1.33 mmol/L Final     LACTATE   Date Value Ref Range Status   05/05/2021 1.8 (H) 0.0 - 1.3 mmol/L Final     HEMOGLOBIN   Date Value Ref Range Status   03/22/2021 11.1 (L) 12.0 - 18.0 g/dL Final     OXYHEMOGLOBIN   Date Value Ref Range Status   03/22/2021 97.8 85.0 - 98.0 % Final     CARBOXYHEMOGLOBIN   Date Value Ref Range Status   03/22/2021 1.8 0.0 - 2.5 % Final     MET-HEMOGLOBIN   Date Value Ref Range Status   03/22/2021 0.4 0.0 - 2.0 % Final     BASE EXCESS   Date Value Ref Range Status   05/05/2021 5.4 (H) -3.0 - 3.0 mmol/L Final     BASE EXCESS (ARTERIAL)   Date Value Ref Range Status   04/04/2021 1.2 (H) 0.0 - 1.0 mmol/L Final     BASE DEFICIT   Date Value Ref Range Status   04/03/2021 0.2 0.0 - 3.0 mmol/L Final     BICARBONATE (ARTERIAL)    Date Value Ref Range Status   04/04/2021 25.8 18.0 - 26.0 mmol/L Final     BICARBONATE (VENOUS)   Date Value Ref Range Status   05/05/2021 29.0 (H) 22.0 - 26.0 mmol/L Final     TEMPERATURE, COMP   Date Value Ref Range Status   03/22/2021 36.9 15.0 - 40.0 C Final     %FIO2 (VENOUS)   Date Value Ref Range Status   05/05/2021 36.0 % Final     Airway:* No LDAs found *  Blood pressure 108/65, pulse 72, temperature 36.3 C (97.4 F), resp. rate 16, SpO2 98%.

## 2023-07-20 NOTE — Discharge Instructions (Signed)
 COLONOSCOPY/ SIGMOIDOSCOPY  RESTRICTION ON ACTIVITY  IF SEDATED:  Do not drive a car or operate machinery until the day after the procedure.  Following day: Return to full activity including work.    Diet:  Eat and drink normally unless instructed otherwise.    Treatment for common after effects:    For Colonoscopy or Sigmoidoscopy:  Mild abdominal pain, bloating or excess gas: Rest, eat lightly and use a heating pad.    Symptoms to watch for and report to your physician:  Chills or fever occurring within 24 hours after procedure  Severe abdominal pain or bloating  A large amount of rectal bleeding. (A small amount of blood from the rectum is not serious, especially if hemorrhoids are present).  Pain in the chest.    If a polyp has been removed:  For the next 7 days:  Do not take aspirin                                        Check with physician if taking a long car trip.  If bright red rectal bleeding occurs, call your physician.  For 3 days: No heavy lifting, straining, or running.    If scheduled for a Barium Enema, you will be given an instruction sheet in radiology.    If you have any problems, questions or concerns contact your physician.  If you are unable to reach your physician and feel you need immediate attention, go to the Emergency Room.    General Anesthesia, Adult, Care After    Refer to this sheet in the next few weeks.  These instructions provide you with information on caring for yourself after your procedure.  Your health care provider may also give your more specific instructions. Your treatment has been planned according to current medical practices, but problems sometimes occur.  Call your health care provider if you have any problems or questions after your procedure.    WHAT TO EXPECT AFTER THE PROCEDURE  After the procedure, it is typical to experience:    Sleepiness  Nausea and vomiting    HOME CARE INSTRUCTIONS    For the first 24 hours after general anesthesia:  Have a responsible person  with you.  Do not drive a car. If you are alone, do not take public transportation  Do not drink alcohol.  Do not take medicine that has not been prescribed by your health care provider.  Do not sign important papers or make important decisions.  You may resume a normal diet and activities as directed by your health care provider.  Change bandages (dressings) as directed.  If you have questions or problems that seem related to general anesthesia, call the hospital and ask if the anesthetist or anesthesiologist on call.    SEEK MEDICAL CARE IF:    You have nausea and vomiting that continue the day after anesthesia.  You develop a rash:    SEE IMMEDIATE MEDICAL CARE IF:    You have difficulty breathing.  You have chest pain.  You have any allergic problems.    This information is not intended to replace advice given to you by your health care provider. Make sure you discuss any questions you have with your health care provider.

## 2023-07-20 NOTE — H&P (Signed)
 Gastroenterology H&P     Janyth Meres, 78 y.o., male    Date of service: 07/20/23  Date of Birth:  [birth year]    Referring Physician: [facility]    Chief Complaint:  IDA, h/o colon polyps    History of Present Illness:      Autrey Human is a 78 y.o.male patient with significant PMH listed below here for IDA, h/o colon polyps.     Denies fevers, chills, nausea, vomiting, heartburn, reflux, melena, hematochezia, or unintentional weight loss.     Review of Systems:  ROS otherwise negative except for pertinent positives discussed in HPI above.    Past Medical History:   Diagnosis Date    C. 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)     Hx of transfusion     Hyperlipidemia     borderline    Hypothyroidism     MRSA (methicillin resistant staph aureus) culture positive 01/02/2021    MRSA left groin abscess 01/03/21    MRSA (methicillin resistant staph aureus) culture positive 01/02/2021    MRSA blood 01/02/21    Pulmonary embolism 03/31/2021    Thyroid  disorder     Type 2 diabetes mellitus     Wears glasses          Past Surgical History:   Procedure Laterality Date    COLON SURGERY      per patient had part of colon removed and had a colostomy    COLONOSCOPY      GASTROSCOPY  06/24/2021    Dr.Thakur    HIP SURGERY Right 04/22/2020    Right hip arthrotomy 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 Sue Em fo rMRSA. 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>    HX CERVICAL SPINE SURGERY  2001    no hardware    HX COLOSTOMY REVERSAL      HX HERNIA  REPAIR Right     inguinal hernia    HX HIP SURGERY Left 11/03/2022    Left total hip arthroplasty  Dr. Nira Basset LAPAROTOMY      abdominal    KNEE SURGERY Bilateral     just cleaned up no replacements    PANCREATECTOMY      2023, 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    SPLENECTOMY      2023    WRIST SURGERY Right          Family Medical History:       Problem Relation (Age of Onset)    Cancer Mother, Father, Other    Heart Attack Mother    High Cholesterol Other    Stroke Other            Social History     Socioeconomic History    Marital status: Married  Tobacco Use    Smoking status: Never    Smokeless tobacco: Never   Vaping Use    Vaping status: Never Used   Substance and Sexual Activity    Alcohol use: Not Currently    Drug use: Never   Other Topics Concern    Ability to Walk 1 Flight of Steps without SOB/CP Yes    Ability to Walk 2 Flight of Steps without SOB/CP No    Ability To Do Own ADL's Yes     Social Determinants of Health     Financial Resource Strain: Low Risk  (05/05/2022)    Financial Resource Strain     SDOH Financial: No   Transportation Needs: Low Risk  (05/05/2022)    Transportation Needs     SDOH Transportation: No   Social Connections: High Risk (11/03/2022)    Social Connections     SDOH Social Isolation: Less than once a week   Intimate Partner Violence: Low Risk  (12/31/2021)    Intimate Partner Violence     SDOH Domestic Violence: No   Housing Stability: Low Risk  (05/05/2022)    Housing Stability     SDOH Housing Situation: I have housing.     SDOH Housing Worry: No     Expanded Substance History     Additional history     Allergies[1]  Outpatient Medications Marked as Taking for the 07/20/23 encounter [facility] Encounter)   Medication Sig    amLODIPine  (NORVASC ) 10 mg Oral Tablet Take 1 Tablet (10 mg total) by mouth Daily    atorvastatin  (LIPITOR) 10 mg Oral Tablet Take 2 Tablets (20 mg total) by mouth Every evening    buprenorphine 5 mcg/hour Transdermal Patch Weekly Place 1 Patch on the skin Every 7 days 7.5  mg    cyanocobalamin  (VITAMIN B 12) 1,000 mcg Oral Tablet Take 1 Tablet (1,000 mcg total) by mouth Every morning    docusate sodium  (COLACE) 100 mg Oral Capsule Take 1 Capsule (100 mg total) by mouth    elvitegravir-cobicistat-emtricitrabine-tenofovir alafen (GENVOYA ) 150-150-200-10 mg Oral Tablet Take 1 Tablet by mouth Once a day    ferrous sulfate  (FEOSOL) 325 mg (65 mg iron ) Oral Tablet Take 1 Tablet (325 mg total) by mouth Once a day    gabapentin  (NEURONTIN ) 100 mg Oral Capsule Take 3 Capsules (300 mg total) by mouth Three times a day For shingle pain    glipiZIDE  (GLUCOTROL ) 5 mg Oral Tablet 5 mg twice daily    levothyroxine  (SYNTHROID ) 88 mcg Oral Tablet Take 1 Tablet (88 mcg total) by mouth Every morning    melatonin 3 mg Oral Tablet Take 10 mg by mouth Every night    MULTIVITAMIN ORAL Take 1 Tablet by mouth Once a day    pantoprazole  (PROTONIX ) 40 mg Oral Tablet, Delayed Release (E.C.) Take 1 Tablet (40 mg total) by mouth Daily    sennosides-docusate sodium  (SENOKOT-S) 8.6-50 mg Oral Tablet Take 1 Tablet by mouth Every evening          Objective:    Physical Exam:  BP 138/73   Pulse 78   Temp 36.3 C (97.4 F)   Resp 20   SpO2 99%          General Appearance: The patient is lying in bed in no acute distress.    ENT: PERL EOMI, nose and mouth mucosa unremarkable   Heart: S1-S2 normal, no murmurs  Lungs: clear to auscultation bilaterally, no crackles  Abdomen: soft, non-tender to palpation, no  rebound tenderness or guarding, bowel sounds present  Neurological: alert oriented x3, no focal deficit  Extremities: no edema noted  Skin: no rashes  Joints: no deformities     Labs:  No visits with results within 1 Day(s) from this visit.   Latest known visit with results is:   Office Visit on 05/05/2023   Component Date Value Ref Range Status    GLUCOSE, POC 05/05/2023 111  Fasting: 80-130 mg/dL; 2 HR PC: <811 mg/dL mg/dl Final    HEMOGLOBIN B1Y, POC 05/05/2023 6.6 (H)  <5.7 in non-diabetic; >6.5% diabetic %  Final       Imaging Studies:    XR LOW PELVIS W LEFT LATERAL HIP  Result Date: 07/10/2023  *Procedure not read by radiology. *Please Refer to Procedure Note for result.    MRI SPINE LUMBOSACRAL WO CONTRAST  Result Date: 07/07/2023  Male, 78 years old. MRI SPINE LUMBOSACRAL WO CONTRAST performed on 07/07/2023 9:26 AM. REASON FOR EXAM:  M54.16: Radiculopathy, lumbar region FINDINGS: Sagittal and axial T1 and T2-weighted MR imaging of the lumbar spine was performed and compared to prior MRI from 2022. A more recent plain film study dated 06/01/2023 is also reviewed. The vertebral body heights and alignment are well-maintained. Old Schmorl's node is seen at the inferior endplate of T11. There is no marrow edema or suspicious marrow replacing lesion. The patient does have some loss of disc height at the lower levels. Facet arthrosis is noted and most pronounced at the lower levels similar to the prior studies. The distal spinal cord, conus medullaris filum terminale are grossly unremarkable. L1-2: There is a shallow broad-based disc bulge and a subligamentous protrusion of disc material extending centrally and paracentrally to the right. This creates only a mild degree of central narrowing. Neural foramen are mildly narrowed as well. L2-3: No stenosis detected. L3-4: Mild to moderate central narrowing is present secondary to a combination of shallow disc osteophyte complex, facet arthrosis and ligament flavum hypertrophy. There is bilateral neural foraminal narrowing slightly greater on the right. L4-5: There is also up to moderate central narrowing due to a combination of degenerative change. There is mild to moderate neural foraminal narrowing slightly greater on the left. L5-S1: Shallow bulging of disc material seen without central stenosis. There is mild if not moderate left neural foraminal narrowing.     1. Mild progression of degenerative changes resulting in some mild-to-moderate central stenosis and neural foraminal  stenosis. For complete details please refer to the individual levels described above. Radiologist location ID: [id number]       Assessment:   Aidric Endicott is a 78 y.o.male patient with significant PMH listed above here for IDA, h/o colon polyps.     Recommendations:    The patient was explained the potential risks of the endoscopic procedure(s) including but not limited to bleeding, perforation, mucosal tears, spleen laceration, anesthesia related complications including death and has agreed to undergo these tests.    Melynda Stagger, MD  Gastroenterologist         [1] No Known Allergies

## 2023-07-20 NOTE — Anesthesia Preprocedure Evaluation (Signed)
 ANESTHESIA PRE-OP EVALUATION  Planned Procedure: COLONOSCOPY  Review of Systems    PONV       patient summary reviewed  nursing notes reviewed        Pulmonary   History of pulmonary embolism,   Cardiovascular    Hypertension, hyperlipidemia and DVT ,       GI/Hepatic/Renal    GERD and renal insufficiency        Endo/Other    Splenectomy    Human immunodeficiency virus, hypothyroidism and diverticulitis,   type 2 diabetes    Neuro/Psych/MS    CVA, back abnormality, depression     Cancer  CA,                       Physical Assessment      Airway       Mallampati: II      Mouth Opening: fair.            Dental                    Pulmonary    Comment: History of pulmonary embolism         Cardiovascular             Other findings              Plan  ASA 3     Planned anesthesia type: MAC           PONV Plan:  I plan to administer pharmcologic prophalaxis antiemetics              Intravenous induction     Anesthesia issues/risks discussed are: PONV, Stroke, Intraoperative Awareness/ Recall and Cardiac Events/MI.  Anesthetic plan and risks discussed with patient  signed consent obtained                     Plan discussed with CRNA.

## 2023-07-20 NOTE — Anesthesia Postprocedure Evaluation (Signed)
 Anesthesia Post Op Evaluation    Patient: [name]  Procedure(s):  COLONOSCOPY WITH POLYPECTOMY    Last Vitals:Temperature: 36.3 C (97.4 F) (07/20/23 0704)  Heart Rate: 72 (07/20/23 0820)  BP (Non-Invasive): 108/65 (07/20/23 0820)  Respiratory Rate: 16 (07/20/23 0820)  SpO2: 98 % (07/20/23 0820)    No notable events documented.    Patient is sufficiently recovered from the effects of anesthesia to participate in the evaluation and has returned to their pre-procedure level.  Patient location during evaluation: PACU       Patient participation: complete - patient participated  Level of consciousness: awake and alert and responsive to verbal stimuli    Pain management: adequate  Airway patency: patent    Anesthetic complications: no  Cardiovascular status: acceptable  Respiratory status: acceptable  Hydration status: acceptable  Patient post-procedure temperature: Pt Normothermic   PONV Status: Absent

## 2023-07-21 DIAGNOSIS — D120 Benign neoplasm of cecum: Secondary | ICD-10-CM

## 2023-07-21 LAB — SURGICAL PATHOLOGY SPECIMEN

## 2023-07-24 ENCOUNTER — Ambulatory Visit (HOSPITAL_BASED_OUTPATIENT_CLINIC_OR_DEPARTMENT_OTHER): Payer: Self-pay | Admitting: Gastroenterology

## 2023-08-02 ENCOUNTER — Telehealth (INDEPENDENT_AMBULATORY_CARE_PROVIDER_SITE_OTHER): Payer: Self-pay | Admitting: Internal Medicine

## 2023-08-02 ENCOUNTER — Ambulatory Visit: Attending: Family Medicine

## 2023-08-02 ENCOUNTER — Other Ambulatory Visit: Payer: Self-pay

## 2023-08-02 ENCOUNTER — Encounter (INDEPENDENT_AMBULATORY_CARE_PROVIDER_SITE_OTHER): Payer: Self-pay

## 2023-08-02 DIAGNOSIS — Z713 Dietary counseling and surveillance: Secondary | ICD-10-CM | POA: Insufficient documentation

## 2023-08-02 DIAGNOSIS — B20 Human immunodeficiency virus [HIV] disease: Secondary | ICD-10-CM | POA: Insufficient documentation

## 2023-08-02 DIAGNOSIS — Z794 Long term (current) use of insulin: Secondary | ICD-10-CM | POA: Insufficient documentation

## 2023-08-02 DIAGNOSIS — Z978 Presence of other specified devices: Secondary | ICD-10-CM | POA: Insufficient documentation

## 2023-08-02 DIAGNOSIS — E1165 Type 2 diabetes mellitus with hyperglycemia: Secondary | ICD-10-CM | POA: Insufficient documentation

## 2023-08-02 DIAGNOSIS — D509 Iron deficiency anemia, unspecified: Secondary | ICD-10-CM | POA: Insufficient documentation

## 2023-08-02 DIAGNOSIS — E782 Mixed hyperlipidemia: Secondary | ICD-10-CM | POA: Insufficient documentation

## 2023-08-02 DIAGNOSIS — M818 Other osteoporosis without current pathological fracture: Secondary | ICD-10-CM | POA: Insufficient documentation

## 2023-08-02 LAB — COMPREHENSIVE METABOLIC PANEL, NON-FASTING
ALBUMIN: 4.6 g/dL (ref 3.5–5.0)
ALKALINE PHOSPHATASE: 96 U/L (ref 38–126)
ALT (SGPT): 18 U/L (ref <=50)
ANION GAP: 12 mmol/L
AST (SGOT): 23 U/L (ref 17–59)
BILIRUBIN TOTAL: 0.5 mg/dL (ref 0.2–1.3)
BUN/CREA RATIO: 12
BUN: 18 mg/dL (ref 9–20)
CALCIUM: 9.9 mg/dL (ref 8.4–10.2)
CHLORIDE: 102 mmol/L (ref 98–107)
CO2 TOTAL: 27 mmol/L (ref 22–30)
CREATININE: 1.48 mg/dL — ABNORMAL HIGH (ref 0.66–1.25)
ESTIMATED GFR: 48 mL/min/{1.73_m2} — ABNORMAL LOW (ref >=60)
GLUCOSE: 145 mg/dL — ABNORMAL HIGH (ref 74–106)
POTASSIUM: 4.3 mmol/L (ref 3.5–5.1)
PROTEIN TOTAL: 7.8 g/dL (ref 6.3–8.2)
SODIUM: 141 mmol/L (ref 137–145)

## 2023-08-02 LAB — LIPID PANEL
CHOLESTEROL: 258 mg/dL — ABNORMAL HIGH (ref 0–199)
HDL CHOL: 36 mg/dL — ABNORMAL LOW (ref 40–60)
TRIGLYCERIDES: 852 mg/dL — ABNORMAL HIGH (ref 0–149)

## 2023-08-02 LAB — MANUAL DIFF AND MORPHOLOGY-SYSMEX
BASOPHIL #: <0.1 10*3/uL (ref <=0.20)
BASOPHIL %: 0 %
EOSINOPHIL #: 0.23 10*3/uL (ref <=0.50)
EOSINOPHIL %: 2 %
LYMPHOCYTE #: 5.1 10*3/uL — ABNORMAL HIGH (ref 1.00–4.80)
LYMPHOCYTE %: 44 %
MONOCYTE #: 0.58 10*3/uL (ref 0.20–1.10)
MONOCYTE %: 5 %
NEUTROPHIL #: 5.68 10*3/uL (ref 1.50–7.70)
NEUTROPHIL %: 49 %
RBC MORPHOLOGY: NORMAL

## 2023-08-02 LAB — CBC WITH DIFF
HCT: 43.8 % (ref 38.9–52.0)
HGB: 14.7 g/dL (ref 13.4–17.5)
MCH: 32.5 pg — ABNORMAL HIGH (ref 26.0–32.0)
MCHC: 33.6 g/dL (ref 31.0–35.5)
MCV: 96.9 fL (ref 78.0–100.0)
MPV: 11.6 fL (ref 8.7–12.5)
PLATELETS: 240 10*3/uL (ref 150–400)
RBC: 4.52 10*6/uL (ref 4.50–6.10)
RDW-CV: 14.4 % (ref 11.5–15.5)
WBC: 11.6 10*3/uL — ABNORMAL HIGH (ref 3.7–11.0)

## 2023-08-02 LAB — FERRITIN: FERRITIN: 78 ng/mL (ref 18–464)

## 2023-08-02 LAB — MICROALBUMIN/CREATININE RATIO, URINE, RANDOM
CREATININE RANDOM URINE: 121 mg/dL (ref 100–200)
MICROALBUMIN RANDOM URINE: 5.9 mg/dL — ABNORMAL HIGH (ref 0.0–1.7)
MICROALBUMIN/CREATININE RATIO RANDOM URINE: 48.8 mg/g — ABNORMAL HIGH (ref 0.0–30.0)

## 2023-08-02 LAB — LDL CHOLESTEROL, DIRECT: LDL DIRECT: 66 mg/dL (ref <100)

## 2023-08-02 LAB — IRON TRANSFERRIN AND TIBC
IRON (TRANSFERRIN) SATURATION: 33 % (ref 20–55)
IRON: 103 ug/dL (ref 49–181)
TOTAL IRON BINDING CAPACITY: 316 ug/dL (ref 261–462)

## 2023-08-02 LAB — VITAMIN D 25 TOTAL: VITAMIN D 25, TOTAL: 33.1 ng/mL (ref 30.00–100.00)

## 2023-08-02 NOTE — Ancillary Notes (Signed)
 [facility] [place]    Venipuncture performed in office on right arm antecubital vein, dry pressure dressing was applied to site and patient tolerated it well.  Specimen was centrifuged, aliquoted as needed and specimen was labeled and packaged for transport.    Berwyn Kassandra Celinda Karma, PHLEBOTOMIST  08/02/2023, 15:08

## 2023-08-02 NOTE — Telephone Encounter (Signed)
 Microalbumin creatinine ratio mildly elevated.  Recommend glucose control, blood pressure control, sodium restriction  Continue to monitor    Please notify patient

## 2023-08-03 ENCOUNTER — Ambulatory Visit (HOSPITAL_BASED_OUTPATIENT_CLINIC_OR_DEPARTMENT_OTHER): Payer: Self-pay | Admitting: Nurse Practitioner

## 2023-08-03 ENCOUNTER — Encounter (HOSPITAL_BASED_OUTPATIENT_CLINIC_OR_DEPARTMENT_OTHER): Payer: Self-pay

## 2023-08-03 LAB — SYPHILIS, RAPID PLASMIN REAGIN (RPR), WITH TITER REFLEX: RPR QUALITATIVE: NONREACTIVE

## 2023-08-05 LAB — QUANTIFERON TB2: QFT TB2: 0.08 [IU]/mL

## 2023-08-05 LAB — QUANTIFERON TB1: QFT TB1: 0.08 [IU]/mL

## 2023-08-05 LAB — QUANTIFERON
QFT MITOGEN: >10 [IU]/mL
QFT NIL: 0.0751 [IU]/mL
QFT QUALITATIVE: NEGATIVE
QFT TB1: 0.08 [IU]/mL
QFT TB2: 0.08 [IU]/mL

## 2023-08-05 LAB — QUANTIFERON TB NIL: QFT NIL: 0.0751 [IU]/mL

## 2023-08-05 LAB — QUANTIFERON TB MITOGEN: QFT MITOGEN: >10 [IU]/mL

## 2023-08-05 LAB — C-PEPTIDE: C-PEPTIDE: 10.3 ng/mL — ABNORMAL HIGH (ref 1.4–6.8)

## 2023-08-08 ENCOUNTER — Other Ambulatory Visit: Payer: Self-pay

## 2023-08-08 LAB — CD4/CD8
CD3 # (T CELL): 3495 {cells}/uL — ABNORMAL HIGH (ref 892–2436)
CD3 % (T CELL): 68 %
CD4 # (HELPER T CELL): 1252 {cells}/uL (ref 382–1614)
CD4 % (HELPER T CELL): 25 %
CD4:CD8 RATIO: 0.6 — ABNORMAL LOW (ref 1.0–3.4)
CD8 # (SUPPRESSOR T CELL): 2285 {cells}/uL — ABNORMAL HIGH (ref 157–813)
CD8 % (SUPPRESSOR T CELL): 45 %

## 2023-08-08 LAB — HIV-1 RNA QUANTITATIVE PCR, PLASMA: HIV1 RNA VIRAL LOAD: NOT DETECTED

## 2023-08-09 ENCOUNTER — Ambulatory Visit: Payer: Self-pay | Attending: PHYSICIAN ASSISTANT | Admitting: PHYSICIAN ASSISTANT

## 2023-08-09 ENCOUNTER — Encounter (INDEPENDENT_AMBULATORY_CARE_PROVIDER_SITE_OTHER): Payer: Self-pay | Admitting: PHYSICIAN ASSISTANT

## 2023-08-09 VITALS — BP 128/84 | HR 70 | Resp 18 | Ht 70.0 in | Wt 172.0 lb

## 2023-08-09 DIAGNOSIS — B20 Human immunodeficiency virus [HIV] disease: Secondary | ICD-10-CM | POA: Insufficient documentation

## 2023-08-09 DIAGNOSIS — Z79899 Other long term (current) drug therapy: Secondary | ICD-10-CM | POA: Insufficient documentation

## 2023-08-09 DIAGNOSIS — B009 Herpesviral infection, unspecified: Secondary | ICD-10-CM | POA: Insufficient documentation

## 2023-08-09 MED ORDER — VALACYCLOVIR 1 GRAM TABLET
1000.0000 mg | ORAL_TABLET | Freq: Two times a day (BID) | ORAL | 0 refills | Status: DC
Start: 2023-08-09 — End: 2023-08-09

## 2023-08-09 MED ORDER — VALACYCLOVIR 1 GRAM TABLET
1000.0000 mg | ORAL_TABLET | Freq: Two times a day (BID) | ORAL | 0 refills | Status: AC
Start: 2023-08-09 — End: 2023-08-22

## 2023-08-09 NOTE — Nursing Note (Signed)
 Follow up visit for HIV/HSV. Patient is currently taking Genvoya . He did call in reporting genital HSV outbreak a couple weeks ago, he had standing prescription for Valtrex  x 7 days that he took. States that outbreak resolved. Would like another refill on Valtrex  on hand.   Updated lab work was completed 08/02/23 for review.   Complaining today of sciatic nerve pain. Has apt with pain management in [place] later today.

## 2023-08-09 NOTE — Addendum Note (Signed)
 Addended by: PASCAL DARICE HERO. on: 08/09/2023 10:09 AM     Modules accepted: Orders

## 2023-08-09 NOTE — Progress Notes (Addendum)
 [name]  [name]       Return Patient Visit       CHIEF COMPLAINT: HIV Follow-up and Sexually Transmitted Disease       Nursing Notes:   [name] , [name], RN  08/09/23 9062  Signed  Follow up visit for HIV/HSV. Patient is currently taking Genvoya . He did call in reporting genital HSV outbreak a couple weeks ago, he had standing prescription for Valtrex  x 7 days that he took. States that outbreak resolved. Would like another refill on Valtrex  on hand.   Updated lab work was completed 08/02/23 for review.   Complaining today of sciatic nerve pain. Has apt with pain management in [place] later today.        HPI: [name] is a 78 y.o. male who follows up with outpatient infectious disease for the diagnosis of HIV. HIV infection History of MRSA bacteremia secondary skin and soft tissue infection. Patient is on Genvoya , stable HIV VL and CD4 count. 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 He gets back and hip injections. 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 Follows with [facility].     PCP: Dr. Woodie Das. On Lipitor. 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     REVIEW OF SYSTEMS:  Negative except as noted in HPI    MEDICATIONS:  Current Outpatient Medications   Medication Sig    acetaminophen  (TYLENOL ) 325 mg Oral Tablet Take 2 Tablets (650 mg total) by mouth Every 6 hours as needed for Pain    amLODIPine  (NORVASC ) 10 mg Oral Tablet Take 1 Tablet (10 mg total) by mouth Daily    atorvastatin  (LIPITOR) 10 mg Oral Tablet Take 2 Tablets (20 mg total) by mouth Every evening    Blood-Glucose Sensor (FREESTYLE LIBRE 3 PLUS SENSOR) Does not apply Device Replace Every 15 days    buprenorphine 5 mcg/hour Transdermal Patch Weekly Place 1 Patch on the skin Every 7 days 7.5 mg    cyanocobalamin  (VITAMIN B 12) 1,000 mcg Oral Tablet Take 1 Tablet (1,000 mcg total) by mouth  Every morning    docusate sodium  (COLACE) 100 mg Oral Capsule Take 1 Capsule (100 mg total) by mouth    elvitegravir-cobicistat-emtricitrabine-tenofovir alafen (GENVOYA ) 150-150-200-10 mg Oral Tablet Take 1 Tablet by mouth Once a day    ferrous sulfate  (FEOSOL) 325 mg (65 mg iron ) Oral Tablet Take 1 Tablet (325 mg total) by mouth Once a day    gabapentin  (NEURONTIN ) 100 mg Oral Capsule Take 3 Capsules (300 mg total) by mouth Three times a day For shingle pain    glipiZIDE  (GLUCOTROL ) 5 mg Oral Tablet 5 mg twice daily    levothyroxine  (SYNTHROID ) 88 mcg Oral Tablet Take 1 Tablet (88 mcg total) by mouth Every morning    melatonin 3 mg Oral Tablet Take 10 mg by mouth Every night    MULTIVITAMIN ORAL Take 1 Tablet by mouth Once a day    pantoprazole  (PROTONIX ) 40 mg Oral Tablet, Delayed Release (E.C.) Take 1 Tablet (40 mg total) by mouth Daily    sennosides-docusate sodium  (SENOKOT-S) 8.6-50 mg Oral Tablet Take 1 Tablet by mouth Every evening    valACYclovir  (VALTREX ) 1 gram Oral Tablet Take 1 Tablet (1 g total) by mouth Twice daily for 7 days       PHYSICAL EXAM:  Constitutional: Patient appears stated age.  Eyes: No scleral icterus.   Neck: Neck supple.  Cardiovascular: Normal rate, regular rhythm and normal heart sounds.    Pulmonary/Chest: Effort normal and breath sounds normal. No wheezes. Has no rhonchi. Has no rales.   Abdominal: Soft. Bowel sounds are normal. No distension. There is no tenderness.   Musculoskeletal: Exhibits no edema, tenderness or deformity.   Lymphadenopathy: No enlarged lymph nodes or tenderness present.  Skin: Skin is warm and dry. No rash noted. No erythema.          VITALS:  Vitals:    08/09/23 0935   BP: 128/84   Pulse: 70   Resp: 18   Weight: 78 kg (172 lb)   Height: 1.778 m (5' 10)   BMI: 24.68        HIV VIRAL LOAD    HIV1 RNA VIRAL LOAD   Date Value Ref Range Status   08/02/2023 Target Not Detected Target Not Detected Final       CD4 COUNT CD4 # (HELPER T CELL)   Date Value Ref  Range Status   08/02/2023 1,252 382 - 1,614 cells/uL Final       TB GOLD Recent Results (from the past 8765 hours)   QUANTIFERON    Collection Time: 08/02/23  3:08 PM   Result Value Ref Range    QFT QUALITATIVE Negative Negative     Comment: M. tuberculosis infection NOT likely.  A negative result alone does not exclude infection with M. tuberculosis.      QFT NIL  0.0751 IU/mL     Comment: Nil results are internal negative controls, and should be very low.    QFT MITOGEN >10.00 IU/mL     Comment: Mitogen results are internal positive controls and should be the highest result generated in a set (max, 10.00 units).    QFT TB1 0.08 IU/mL     Comment: The TB tubes represent the reactivity to TB antigens; comparison with Nil and Mitogen results aids in the determination of the qualitative interpretation given.    QFT TB2 0.08 IU/mL     Comment: The TB tubes represent the reactivity to TB antigens; comparison with Nil and Mitogen results aids in the determination of the qualitative interpretation given.       LIPID Lab Results   Component Value Date    CHOLESTEROL 258 (H) 08/02/2023    HDLCHOL 36 (L) 08/02/2023    LDLCHOL  08/02/2023      Comment:      CALCULATION FOR LDL/VLDL-CHOLESTEROL IS INACCURATE WHEN  TRIGLYCERIDE RESULT IS GREATER THAN 400 mg/dL    LDLCHOLDIR 66 92/97/7974    TRIG 852 (H) 08/02/2023        CMP COMPREHENSIVE METABOLIC PANEL   Lab Results   Component Value Date    SODIUM 141 08/02/2023    POTASSIUM 4.3 08/02/2023    CHLORIDE 102 08/02/2023    CO2 27 08/02/2023    ANIONGAP 12 08/02/2023    BUN 18 08/02/2023    CREATININE 1.48 (H) 08/02/2023    GLUCOSENF 145 (H) 08/02/2023    CALCIUM  9.9 08/02/2023    PHOSPHORUS 3.2 05/05/2022    ALBUMIN  4.6 08/02/2023    TOTALPROTEIN 7.8 08/02/2023    ALKPHOS 96 08/02/2023    AST 23 08/02/2023    ALT 18 08/02/2023    BILIRUBINCON 0.1 06/24/2021                RPR   RPR QUALITATIVE   Date Value Ref Range Status   08/02/2023 Nonreactive Nonreactive Final  No  results found for this or any previous visit (from the past 8765 hours).        Health Maintenance Due   Topic Date Due    Diabetic Retinal Exam  Never done    Osteoporosis screening  Never done    Adult Tdap-Td (1 - Tdap) Never done    Hepatitis A Vaccine (1 of 2 - Risk 2-dose series) Never done    Medicare Annual Wellness Visit  Never done    RSV Adult 60+ or Pregnancy (1 - 1-dose 75+ series) Never done    Meningococcal B Vaccine (2 of 4 - Increased Risk Bexsero  3-dose series) 04/01/2021    Meningococcal Vaccine (2 - Risk 2-dose series) 04/29/2021    Pneumococcal Vaccination, Age 97+ (2 of 2 - PPSV23) 03/04/2022    Covid-19 Vaccine (7 - Moderna risk 2024-25 season) 05/25/2023        Immunization History   Administered Date(s) Administered    Covid-19 Vaccine,Moderna Bivalent 01/07/2021    Covid-19 Vaccine,Moderna(Spikevax),53mcg/0.5mL,12 yr+,Seasonal 11/09/2021    Covid-19 Vaccine,Moderna,12 Years+ 03/02/2019, 03/30/2019, 11/27/2019    Covid-19 Vaccine,Pfizer(Comirnaty),48mcg/0.3ml,12 yr+,Seasonal 11/24/2022    HAEMOPHILUS B CONJUGATE VACCINE  03/04/2021    HEPATITIS B VIRUS RECOMB VACCINE 10/11/2013, 11/18/2013, 02/18/2014    High-Dose Influenza Vaccine, 65+ 11/06/2018, 10/29/2021    Influenza Vaccine, 6 month-adult 11/18/2013, 10/27/2014, 10/16/2015, 10/17/2016, 11/03/2017    Meningococcal B Vaccine - 2 Dose 03/04/2021    Meningococcal Vaccine 03/04/2021    PREVNAR 13 12/14/2012, 01/14/2014    Shingrix - Zoster Vaccine 02/27/2018    VAXNEUVANCE  03/04/2021                     ASSESSMENT/PLAN    ICD-10-CM    1. 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 HSV (herpes simplex virus) infection  B00.9 valACYclovir  (VALTREX ) 1 gram Oral Tablet     DISCONTINUED: valACYclovir  (VALTREX ) 1 gram Oral Tablet      3. High risk medications (not anticoagulants) long-term use  Z79.899  LIPID PANEL      On Genvoya , tolerating well. [facility]  Triglycerides are elevated, patient s/p partial pancreatectomy, follows with local endocrinology  HIV VL not detected, CD4 count within normal limits.   Will send Rx for Valtrex , patient gets 1-2 outbreaks per year and will treat with Valtrex  during those times.   Repeat labs in 6 months  Return to clinic 6 months         PATIENT INSTRUCTIONS:  There are no Patient Instructions on file for this visit.    FOLLOW UP  Return in about 6 months (around 02/09/2024) for follow-up HIV.    Darice Kussmaul, PA

## 2023-08-11 ENCOUNTER — Other Ambulatory Visit (INDEPENDENT_AMBULATORY_CARE_PROVIDER_SITE_OTHER): Payer: Self-pay | Admitting: Internal Medicine

## 2023-08-11 NOTE — Telephone Encounter (Signed)
 Lab Results   Component Value Date    CHOLESTEROL 258 (H) 08/02/2023    HDLCHOL 36 (L) 08/02/2023    LDLCHOL  08/02/2023      Comment:      CALCULATION FOR LDL/VLDL-CHOLESTEROL IS INACCURATE WHEN  TRIGLYCERIDE RESULT IS GREATER THAN 400 mg/dL    LDLCHOLDIR 66 92/97/7974    TRIG 852 (H) 08/02/2023         Increase Lipitor to 40 mg once daily  May proceed with IV Reclast .  Creatinine clearance, vitamin-D and calcium  acceptable    Please order above and notify patient

## 2023-08-14 MED ORDER — ATORVASTATIN 40 MG TABLET
40.0000 mg | ORAL_TABLET | Freq: Every day | ORAL | 4 refills | Status: AC
Start: 2023-08-14 — End: ?

## 2023-08-14 NOTE — Telephone Encounter (Signed)
 Patient Notified via telephone.  Orders placed.    Montie Gash, RN

## 2023-08-14 NOTE — Telephone Encounter (Signed)
 Reclast  is scheduled for 09/18/2023.    Montie Gash, RN

## 2023-08-21 ENCOUNTER — Ambulatory Visit (HOSPITAL_BASED_OUTPATIENT_CLINIC_OR_DEPARTMENT_OTHER): Payer: Self-pay | Admitting: Nurse Practitioner

## 2023-08-21 ENCOUNTER — Other Ambulatory Visit: Payer: Self-pay

## 2023-08-21 NOTE — H&P (Unsigned)
 GASTROENTEROLOGY, MEDICAL OFFICE BUILDING B  [address]  [place] [address]  [phone]    Progress Note  Name: Hunter Bachar  MRN: [id number]  DOB: Jan 04, 1946  Age: 78 y.o.  Date: 08/22/2023    Reason for Visit: No chief complaint on file.    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)     Hx of transfusion     Hyperlipidemia     borderline    Hypothyroidism     MRSA (methicillin resistant staph aureus) culture positive 01/02/2021    MRSA left groin abscess 01/03/21    MRSA (methicillin resistant staph aureus) culture positive 01/02/2021    MRSA blood 01/02/21    Pulmonary embolism 03/31/2021    Thyroid  disorder     Type 2 diabetes mellitus     Wears glasses          Past Surgical History:   Procedure Laterality Date    COLON SURGERY      per patient had part of colon removed and had a colostomy    COLONOSCOPY      GASTROSCOPY  06/24/2021    Dr.Thakur    HIP SURGERY Right 04/22/2020    Right hip  arthrotomy 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 Glen fo rMRSA. 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>    HX CERVICAL SPINE SURGERY  2001    no hardware    HX COLOSTOMY REVERSAL      HX HERNIA REPAIR Right     inguinal hernia    HX HIP SURGERY Left 11/03/2022    Left total hip arthroplasty  Dr. Bernardino JOY LAPAROTOMY      abdominal    KNEE SURGERY Bilateral     just cleaned up no replacements    PANCREATECTOMY      2023, 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    SPLENECTOMY      2023    WRIST SURGERY Right          Current Outpatient Medications   Medication Sig    acetaminophen  (TYLENOL ) 325 mg Oral Tablet Take 2 Tablets (650 mg total) by mouth Every 6 hours as needed for Pain    amLODIPine  (NORVASC ) 10 mg Oral Tablet Take 1  Tablet (10 mg total) by mouth Daily    atorvastatin  (LIPITOR) 40 mg Oral Tablet Take 1 Tablet (40 mg total) by mouth Daily    Blood-Glucose Sensor (FREESTYLE LIBRE 3 PLUS SENSOR) Does not apply Device Replace Every 15 days    buprenorphine 5 mcg/hour Transdermal Patch Weekly Place 1 Patch on the skin Every 7 days 7.5 mg    cyanocobalamin  (VITAMIN B 12) 1,000 mcg Oral Tablet Take 1 Tablet (1,000 mcg total) by mouth Every morning    docusate sodium  (COLACE) 100 mg Oral Capsule Take 1 Capsule (100 mg total) by mouth    elvitegravir-cobicistat-emtricitrabine-tenofovir alafen (GENVOYA ) 150-150-200-10 mg Oral Tablet Take 1 Tablet by mouth Once a day    ferrous sulfate  (FEOSOL) 325 mg (65 mg iron ) Oral Tablet Take 1 Tablet (325 mg total) by mouth Once a day    gabapentin  (NEURONTIN ) 100 mg Oral Capsule Take 3 Capsules (300 mg total) by mouth Three times a day For shingle pain    glipiZIDE  (GLUCOTROL ) 5 mg Oral Tablet 5 mg twice daily    levothyroxine  (SYNTHROID ) 88 mcg Oral Tablet Take 1 Tablet (88 mcg total) by mouth Every morning    melatonin 3 mg Oral Tablet Take 10 mg by mouth Every night    MULTIVITAMIN ORAL Take 1 Tablet by mouth Once a day    pantoprazole  (PROTONIX ) 40 mg Oral Tablet, Delayed  Release (E.C.) Take 1 Tablet (40 mg total) by mouth Daily    sennosides-docusate sodium  (SENOKOT-S) 8.6-50 mg Oral Tablet Take 1 Tablet by mouth Every evening     Allergies[1]  Family Medical History:       Problem Relation (Age of Onset)    Cancer Mother, Father, Other    Heart Attack Mother    High Cholesterol Other    Stroke Other            Social History     Socioeconomic History    Marital status: Married     Spouse name: Not on file    Number of children: Not on file    Years of education: Not on file    Highest education level: Not on file   Occupational History    Not on file   Tobacco Use    Smoking status: Never    Smokeless tobacco: Never   Vaping Use    Vaping status: Never Used   Substance and Sexual Activity    Alcohol use: Not Currently    Drug use: Never    Sexual activity: Not on file   Other Topics Concern    Calcium  intake adequate Not Asked    Computer Use Not Asked    Drives Not Asked    Exercise Concern Not Asked    Helmet Use Not Asked    Seat Belt Not Asked    Uses Cane Not Asked    Uses walker Not Asked    Uses wheelchair Not Asked    Right hand dominant Not Asked    Left hand dominant Not Asked    Ambidextrous Not Asked    Uses Scooter Not Asked    Activity Limitations Not Asked    Ability to Walk 1 Flight of Steps without SOB/CP Yes    Routine Exercise Not Asked    Ability to Walk 2 Flight of Steps without SOB/CP No    Unable to Ambulate Not Asked    Total Care Not Asked    Ability To Do Own ADL's Yes  Uses Walker Not Asked    Other Activity Level Not Asked    Uses Cane Not Asked   Social History Narrative    Not on file     Social Determinants of Health     Financial Resource Strain: Low Risk  (05/05/2022)    Financial Resource Strain     SDOH Financial: No   Transportation Needs: Low Risk  (05/05/2022)    Transportation Needs     SDOH Transportation: No   Social Connections: High Risk (11/03/2022)    Social Connections     SDOH Social Isolation: Less than once a week   Intimate Partner  Violence: Low Risk  (12/31/2021)    Intimate Partner Violence     SDOH Domestic Violence: No   Housing Stability: Low Risk  (05/05/2022)    Housing Stability     SDOH Housing Situation: I have housing.     SDOH Housing Worry: No       Review of Systems:  All other review of systems negative except noted in HPI    Physical Exam:  There were no vitals taken for this visit.      Physical Exam  Vitals reviewed.   Constitutional:       General: He is not in acute distress.     Appearance: Normal appearance. He is not ill-appearing.   HENT:      Head: Normocephalic and atraumatic.      Nose: Nose normal.   Eyes:      Extraocular Movements: Extraocular movements intact.      Conjunctiva/sclera: Conjunctivae normal.      Pupils: Pupils are equal, round, and reactive to light.   Pulmonary:      Effort: Pulmonary effort is normal.   Musculoskeletal:         General: No swelling, deformity or signs of injury. Normal range of motion.      Cervical back: Normal range of motion.   Skin:     General: Skin is warm and dry.      Findings: No erythema.   Neurological:      General: No focal deficit present.      Mental Status: He is alert and oriented to person, place, and time. Mental status is at baseline.   Psychiatric:         Mood and Affect: Mood normal.         Behavior: Behavior normal.         Thought Content: Thought content normal.         Assessment & Plan      Mitzie Reef, FNP-C     This note was created with assistance from [employer] via capture of conversational audio. Consent was obtained from the patient and all parties present prior to recording.            [1] No Known Allergies

## 2023-08-22 ENCOUNTER — Encounter (HOSPITAL_BASED_OUTPATIENT_CLINIC_OR_DEPARTMENT_OTHER): Payer: Self-pay | Admitting: Nurse Practitioner

## 2023-08-22 ENCOUNTER — Ambulatory Visit: Payer: Self-pay | Attending: Nurse Practitioner | Admitting: Nurse Practitioner

## 2023-08-22 VITALS — BP 118/68 | Ht 70.0 in | Wt 175.3 lb

## 2023-08-22 DIAGNOSIS — D509 Iron deficiency anemia, unspecified: Secondary | ICD-10-CM | POA: Insufficient documentation

## 2023-08-22 DIAGNOSIS — K219 Gastro-esophageal reflux disease without esophagitis: Secondary | ICD-10-CM | POA: Insufficient documentation

## 2023-08-22 DIAGNOSIS — K5900 Constipation, unspecified: Secondary | ICD-10-CM | POA: Insufficient documentation

## 2023-08-22 DIAGNOSIS — Z860100 Personal history of colon polyps, unspecified: Secondary | ICD-10-CM | POA: Insufficient documentation

## 2023-08-22 MED ORDER — PANTOPRAZOLE 40 MG TABLET,DELAYED RELEASE
40.0000 mg | DELAYED_RELEASE_TABLET | Freq: Every day | ORAL | 3 refills | Status: AC
Start: 2023-08-22 — End: ?

## 2023-08-22 MED ORDER — DOCUSATE SODIUM 100 MG CAPSULE
100.0000 mg | ORAL_CAPSULE | Freq: Two times a day (BID) | ORAL | 3 refills | Status: AC
Start: 2023-08-22 — End: ?

## 2023-08-22 MED ORDER — FERROUS SULFATE 325 MG (65 MG IRON) TABLET
325.0000 mg | ORAL_TABLET | Freq: Every day | ORAL | 3 refills | Status: AC
Start: 2023-08-22 — End: ?

## 2023-08-23 ENCOUNTER — Ambulatory Visit (INDEPENDENT_AMBULATORY_CARE_PROVIDER_SITE_OTHER): Payer: Self-pay

## 2023-09-02 ENCOUNTER — Encounter (INDEPENDENT_AMBULATORY_CARE_PROVIDER_SITE_OTHER): Payer: Self-pay

## 2023-09-18 ENCOUNTER — Ambulatory Visit (HOSPITAL_BASED_OUTPATIENT_CLINIC_OR_DEPARTMENT_OTHER)

## 2023-09-18 ENCOUNTER — Telehealth (INDEPENDENT_AMBULATORY_CARE_PROVIDER_SITE_OTHER): Payer: Self-pay | Admitting: Internal Medicine

## 2023-09-18 ENCOUNTER — Other Ambulatory Visit: Payer: Self-pay

## 2023-09-18 ENCOUNTER — Ambulatory Visit
Admission: RE | Admit: 2023-09-18 | Discharge: 2023-09-18 | Disposition: A | Discharge status: Home / Self Care | Source: Ambulatory Visit | Attending: Internal Medicine | Admitting: Internal Medicine

## 2023-09-18 DIAGNOSIS — Z7983 Long term (current) use of bisphosphonates: Secondary | ICD-10-CM | POA: Insufficient documentation

## 2023-09-18 DIAGNOSIS — M810 Age-related osteoporosis without current pathological fracture: Secondary | ICD-10-CM | POA: Insufficient documentation

## 2023-09-18 LAB — BASIC METABOLIC PANEL
ANION GAP: 7 mmol/L (ref 4–13)
BUN/CREA RATIO: 26 — ABNORMAL HIGH (ref 6–22)
BUN: 31 mg/dL — ABNORMAL HIGH (ref 8–25)
CALCIUM: 9.6 mg/dL (ref 8.6–10.3)
CHLORIDE: 107 mmol/L (ref 96–111)
CO2 TOTAL: 23 mmol/L (ref 23–31)
CREATININE: 1.17 mg/dL (ref 0.75–1.35)
GLUCOSE: 204 mg/dL — ABNORMAL HIGH (ref 65–125)
POTASSIUM: 4.7 mmol/L (ref 3.5–5.1)
SODIUM: 137 mmol/L (ref 136–145)
eGFRcr - MALE: 64 mL/min/BSA (ref >=60)

## 2023-09-18 MED ORDER — ZOLEDRONIC ACID 5 MG/100 ML IN MANNITOL 5 %-WATER INTRAVENOUS PIGGYBCK
5.0000 mg | INJECTION | Freq: Once | INTRAVENOUS | Status: AC
Start: 2023-09-18 — End: 2023-09-18
  Administered 2023-09-18: 0 mg via INTRAVENOUS
  Administered 2023-09-18: 5 mg via INTRAVENOUS
  Filled 2023-09-18: qty 100

## 2023-09-18 NOTE — Telephone Encounter (Signed)
 Creatinine clearance acceptable    Who proceed with IV Reclast 

## 2023-09-18 NOTE — Nurses Notes (Signed)
Reclast infusion given per order. Physician available. No concerns voiced

## 2023-11-01 ENCOUNTER — Other Ambulatory Visit: Payer: Self-pay

## 2023-11-01 ENCOUNTER — Ambulatory Visit: Attending: Internal Medicine

## 2023-11-03 ENCOUNTER — Ambulatory Visit (INDEPENDENT_AMBULATORY_CARE_PROVIDER_SITE_OTHER): Payer: 59

## 2023-11-03 ENCOUNTER — Ambulatory Visit (INDEPENDENT_AMBULATORY_CARE_PROVIDER_SITE_OTHER): Payer: Self-pay

## 2023-11-09 ENCOUNTER — Other Ambulatory Visit: Payer: Self-pay

## 2023-11-10 ENCOUNTER — Ambulatory Visit: Payer: Self-pay | Attending: Internal Medicine | Admitting: Internal Medicine

## 2023-11-10 ENCOUNTER — Encounter (INDEPENDENT_AMBULATORY_CARE_PROVIDER_SITE_OTHER): Payer: Self-pay | Admitting: Internal Medicine

## 2023-11-10 ENCOUNTER — Other Ambulatory Visit (INDEPENDENT_AMBULATORY_CARE_PROVIDER_SITE_OTHER): Payer: Self-pay | Admitting: PHYSICIAN ASSISTANT

## 2023-11-10 ENCOUNTER — Other Ambulatory Visit: Payer: Self-pay

## 2023-11-10 VITALS — BP 112/66 | Ht 70.0 in | Wt 162.0 lb

## 2023-11-10 DIAGNOSIS — Z794 Long term (current) use of insulin: Secondary | ICD-10-CM | POA: Insufficient documentation

## 2023-11-10 DIAGNOSIS — E782 Mixed hyperlipidemia: Secondary | ICD-10-CM | POA: Insufficient documentation

## 2023-11-10 DIAGNOSIS — E1165 Type 2 diabetes mellitus with hyperglycemia: Secondary | ICD-10-CM | POA: Insufficient documentation

## 2023-11-10 DIAGNOSIS — M818 Other osteoporosis without current pathological fracture: Secondary | ICD-10-CM | POA: Insufficient documentation

## 2023-11-10 DIAGNOSIS — Z978 Presence of other specified devices: Secondary | ICD-10-CM | POA: Insufficient documentation

## 2023-11-10 DIAGNOSIS — Z9649 Presence of other endocrine implants: Secondary | ICD-10-CM

## 2023-11-10 DIAGNOSIS — R634 Abnormal weight loss: Secondary | ICD-10-CM | POA: Insufficient documentation

## 2023-11-10 LAB — POC BLOOD GLUCOSE (RESULTS): GLUCOSE, POC: 175 mg/dL (ref 80–130)

## 2023-11-10 LAB — POC HEMOGLOBIN A1C (RESULTS): HEMOGLOBIN A1C, POC: 8.2 % — ABNORMAL HIGH

## 2023-11-10 MED ORDER — GLIPIZIDE 5 MG TABLET
ORAL_TABLET | ORAL | 1 refills | Status: AC
Start: 2023-11-10 — End: ?

## 2023-11-10 MED ORDER — VALACYCLOVIR 1 GRAM TABLET
1000.0000 mg | ORAL_TABLET | Freq: Two times a day (BID) | ORAL | 1 refills | Status: AC
Start: 2023-11-10 — End: 2023-11-24

## 2023-11-10 NOTE — Progress Notes (Signed)
[facility]  [address] [place], [place] [address]  Dept: [phone]  Fax: [phone]    ENDOCRINOLOGY OUTPATIENT PROGRESS NOTE    Name: [name]  Age: 78 y.o., Sex: Male  MRN: [id number]    Referred by: [facility]  Reason for Consult: Diabetes Mellitus    HISTORY OF PRESENT ILLNESS:     [name] is a 78 y.o. male  who is here for his visit       Pertinent Diabetes History    Known history of type 2 diabetes since February of 2023  Positive history for HIV on HAART Therapy    DM was diagnosed when patient had a pancreatic mass preoperatively  Patient was placed on insulin  during his hospital admission. A1c 9.3%  Following his discharge, patient stopped taking all insulin . A1c and glucose levels improved on own.   Suspecting DM related to type 2 rather than pancreatic insufficiency    C-peptide levels in July of 2024 11.8  C-peptide levels  in March of 2025 2.7    A1c 8.2 %    Current Regimen:   Glipizide  5 mg twice daily    Patient admits to dietary noncompliance  However he has given up his late-night desserts and snacking  He also states that he has been more active during the summer    He is concerned about weight loss.  He is not actively trying to lose weight.      Pertinent Osteoporosis History    S/p iv reclast  in aug 2025 (Dose 3)    Known history of compression fracture since early 2023.  Atraumatic    Positive for prostate cancer.  On surveillance  Positive for HIV.  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  Type 2 diabetes  Benign pancreatic mass status post distal pancreatectomy    Past Family History  Mother with cancer and heart disease    Past Surgical History  Distal pancreatectomy    Social  History   No history of smoking, alcohol use or illicit drug use      Allergies  No Known Allergies     Medication History     Current Outpatient Medications   Medication Sig    acetaminophen  (TYLENOL ) 325 mg Oral Tablet Take 2 Tablets (650 mg total) by mouth Every 6 hours as needed for Pain    amLODIPine  (NORVASC ) 10 mg Oral Tablet Take 1 Tablet (10 mg total) by mouth Daily    atorvastatin  (LIPITOR) 40 mg Oral Tablet Take 1 Tablet (40 mg total) by mouth Daily    Blood-Glucose Sensor (FREESTYLE LIBRE 3 PLUS SENSOR) Does not apply Device Replace Every 15 days    buprenorphine 5 mcg/hour Transdermal Patch Weekly Place 1 Patch on the skin Every 7 days 7.5 mg    cyanocobalamin  (VITAMIN B 12) 1,000 mcg Oral Tablet Take 1 Tablet (1,000 mcg total) by mouth Every morning    docusate sodium  (COLACE) 100 mg Oral Capsule Take 1 Capsule (100 mg total) by mouth Twice daily    elvitegravir-cobicistat-emtricitrabine-tenofovir alafen (GENVOYA ) 150-150-200-10 mg Oral Tablet Take 1 Tablet by mouth Once a day    ferrous sulfate  (FEOSOL) 325 mg (65 mg  iron ) Oral Tablet Take 1 Tablet (325 mg total) by mouth Daily    gabapentin  (NEURONTIN ) 100 mg Oral Capsule Take 3 Capsules (300 mg total) by mouth Three times a day For shingle pain    glipiZIDE  (GLUCOTROL ) 5 mg Oral Tablet 5 mg twice daily    levothyroxine  (SYNTHROID ) 88 mcg Oral Tablet Take 1 Tablet (88 mcg total) by mouth Every morning    melatonin 3 mg Oral Tablet Take 10 mg by mouth Every night    MULTIVITAMIN ORAL Take 1 Tablet by mouth Once a day    pantoprazole  (PROTONIX ) 40 mg Oral Tablet, Delayed Release (E.C.) Take 1 Tablet (40 mg total) by mouth Daily    sennosides-docusate sodium  (SENOKOT-S) 8.6-50 mg Oral Tablet Take 1 Tablet by mouth Every evening        OBJECTIVE:     BP 112/66   Ht 1.778 m (5' 10)   Wt 73.5 kg (162 lb 0.6 oz)   BMI 23.25 kg/m        Physical Exam  Constitutional:       General: He is not in acute distress.     Appearance: He is not ill-appearing.    Pulmonary:      Effort: Pulmonary effort is normal. No respiratory distress.   Neurological:      Mental Status: He is alert and oriented to person, place, and time. Mental status is at baseline.   Psychiatric:         Mood and Affect: Mood normal.         Behavior: Behavior normal.         Thought Content: Thought content normal.         Judgment: Judgment normal.       Lab Investigations and Imaging  I have personally reviewed and interpreted all pertinent labs and imaging    BASIC METABOLIC PANEL  Lab Results   Component Value Date    SODIUM 137 09/18/2023    POTASSIUM 4.7 09/18/2023    CHLORIDE 107 09/18/2023    CO2 23 09/18/2023    ANIONGAP 7 09/18/2023    BUN 31 (H) 09/18/2023    CREATININE 1.17 09/18/2023    BUNCRRATIO 26 (H) 09/18/2023    GFR 64 09/18/2023    CALCIUM  9.6 09/18/2023    GLUCOSENF 145 (H) 08/02/2023        ASSESSMENT & PLAN:       ICD-10-CM    1. Type 2 diabetes mellitus with hyperglycemia, with long-term current use of insulin  (CMS HCC)  E11.65 POCT Blood Glucose/Fingerstick (AMB)    Z79.4 POCT HGB A1C     CORTISOL, PLASMA OR SERUM     ADRENOCORTICOTROPIC HORMONE     glipiZIDE  (GLUCOTROL ) 5 mg Oral Tablet     C-PEPTIDE     LIPID PANEL     MICROALBUMIN/CREATININE RATIO, URINE, RANDOM     CREATININE WITH EGFR     THYROID  STIMULATING HORMONE (SENSITIVE TSH)     THYROXINE, FREE (FREE T4)     95251 - INTERPRETATION/REPORT CONTINUOUS GLUCOSE MONITORING VIA SUBCUTANEOUS SENSOR (AMB ONLY)      2. Uses self-applied continuous glucose monitoring device  Z97.8 CORTISOL, PLASMA OR SERUM     ADRENOCORTICOTROPIC HORMONE     glipiZIDE  (GLUCOTROL ) 5 mg Oral Tablet     C-PEPTIDE     LIPID PANEL     MICROALBUMIN/CREATININE RATIO, URINE, RANDOM     CREATININE WITH EGFR     THYROID  STIMULATING HORMONE (SENSITIVE TSH)  THYROXINE, FREE (FREE T4)     95251 - INTERPRETATION/REPORT CONTINUOUS GLUCOSE MONITORING VIA SUBCUTANEOUS SENSOR (AMB ONLY)      3. Mixed hyperlipidemia  E78.2 CORTISOL, PLASMA OR SERUM      ADRENOCORTICOTROPIC HORMONE     glipiZIDE  (GLUCOTROL ) 5 mg Oral Tablet     C-PEPTIDE     LIPID PANEL     MICROALBUMIN/CREATININE RATIO, URINE, RANDOM     CREATININE WITH EGFR     THYROID  STIMULATING HORMONE (SENSITIVE TSH)     THYROXINE, FREE (FREE T4)     95251 - INTERPRETATION/REPORT CONTINUOUS GLUCOSE MONITORING VIA SUBCUTANEOUS SENSOR (AMB ONLY)      4. Secondary osteoporosis  M81.8 CORTISOL, PLASMA OR SERUM     ADRENOCORTICOTROPIC HORMONE     glipiZIDE  (GLUCOTROL ) 5 mg Oral Tablet     C-PEPTIDE     LIPID PANEL     MICROALBUMIN/CREATININE RATIO, URINE, RANDOM     CREATININE WITH EGFR     THYROID  STIMULATING HORMONE (SENSITIVE TSH)     THYROXINE, FREE (FREE T4)     95251 - INTERPRETATION/REPORT CONTINUOUS GLUCOSE MONITORING VIA SUBCUTANEOUS SENSOR (AMB ONLY)      5. Weight loss  R63.4 95251 - INTERPRETATION/REPORT CONTINUOUS GLUCOSE MONITORING VIA SUBCUTANEOUS SENSOR (AMB ONLY)        Weight loss    - this is likely from him making modifications to his daily diet as well as increased physical activity.  His weight last year was about the same    Plan  - a.m. cortisol and ACTH  - TSH and free T4    Uncontrolled type 2 Diabetes mellitus.  A1c 8.2 %  Type 2 diabetes mellitus superimposed with hyperglycemia from HAART therapy.   Unlikely related to pancreatic insufficiency    Free Style Libre CGM  Average days of CGM 14 days  Average blood glucose  137 mg/dl  GMI 3.3%     09% in range (70-180 mg/dl)    9% high (819 - 749 mg/dl)  1% very high (>749 mg/dl)    0% low (29-45 mg/dl)  0% very low (<45 mg/dl)       CGM download is not reflective of the high A1c  Patient has made diet modifications on his own      Plan  - continue glipizide  5 mg twice daily    - microalbumin creatinine ratio, lipid panel, C-peptide, GFR ordered for the next visit    - microalbumin creatinine ratio negative  - foot exam done today.  Decrease monofilament testing bilaterally on the dorsal aspect.  Otherwise pulses palpable.  No ulcers or sores  on the foot.  He does have active healing skin abrasions on his left lower extremity.  This is from him playing with his dog.    - encouraged to be up-to-date with retinopathy screening    - LDL elevated.  Lipitor increased to 40 mg during the last visit.  - blood pressure at goal      Osteoporosis    Risk factors: age, h/o prostate cancer, h/o HIV  Positive for atraumatic compression fractures.     Anabolic agents contraindicated.   Status post 3 doses of IV Reclast     Plan  - DEXA scan scheduled in April of 2026  - continue multivitamins  - further recommendations pending DEXA scan    Return in about 6 months (around 05/10/2024).  The above plan of care communicated to [facility]     Von Barton, MD  11/10/2023  10:40

## 2023-11-10 NOTE — Nursing Note (Signed)
 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 Per Darice Palmateer's last office note, ok to refill. Sending prescription to patient's preferred pharmacy per protocol.

## 2023-11-11 ENCOUNTER — Ambulatory Visit: Attending: Internal Medicine

## 2023-11-11 DIAGNOSIS — Z978 Presence of other specified devices: Secondary | ICD-10-CM | POA: Insufficient documentation

## 2023-11-11 DIAGNOSIS — M818 Other osteoporosis without current pathological fracture: Secondary | ICD-10-CM | POA: Insufficient documentation

## 2023-11-11 DIAGNOSIS — E1165 Type 2 diabetes mellitus with hyperglycemia: Secondary | ICD-10-CM | POA: Insufficient documentation

## 2023-11-11 DIAGNOSIS — Z794 Long term (current) use of insulin: Secondary | ICD-10-CM | POA: Insufficient documentation

## 2023-11-11 DIAGNOSIS — E782 Mixed hyperlipidemia: Secondary | ICD-10-CM | POA: Insufficient documentation

## 2023-11-11 LAB — CORTISOL, PLASMA OR SERUM: CORTISOL: <1 ug/dL — ABNORMAL LOW (ref 7.0–25.0)

## 2023-11-11 LAB — THYROID STIMULATING HORMONE (SENSITIVE TSH): TSH: 1.33 u[IU]/mL (ref 0.350–4.940)

## 2023-11-11 LAB — THYROXINE, FREE (FREE T4): THYROXINE (T4), FREE: 0.8 ng/dL (ref 0.70–1.48)

## 2023-11-13 ENCOUNTER — Telehealth (INDEPENDENT_AMBULATORY_CARE_PROVIDER_SITE_OTHER): Payer: Self-pay | Admitting: Internal Medicine

## 2023-11-13 ENCOUNTER — Encounter (INDEPENDENT_AMBULATORY_CARE_PROVIDER_SITE_OTHER): Payer: Self-pay

## 2023-11-13 NOTE — Telephone Encounter (Signed)
 Thyroid  function acceptable    A.m. cortisol is suppressed  Acth pending    Patient to let us  know if he is taking steroids such as prednisone, dexamethasone 

## 2023-11-14 LAB — ADRENOCORTICOTROPIC HORMONE: ACTH: <5 pg/mL — ABNORMAL LOW (ref 7.7–77.0)

## 2023-11-15 ENCOUNTER — Other Ambulatory Visit (HOSPITAL_BASED_OUTPATIENT_CLINIC_OR_DEPARTMENT_OTHER): Payer: Self-pay | Admitting: Internal Medicine

## 2023-11-15 DIAGNOSIS — R7989 Other specified abnormal findings of blood chemistry: Secondary | ICD-10-CM | POA: Insufficient documentation

## 2023-11-17 ENCOUNTER — Encounter (INDEPENDENT_AMBULATORY_CARE_PROVIDER_SITE_OTHER): Payer: Self-pay

## 2023-11-17 ENCOUNTER — Ambulatory Visit
Admission: RE | Admit: 2023-11-17 | Discharge: 2023-11-17 | Disposition: A | Source: Ambulatory Visit | Attending: Internal Medicine

## 2023-11-17 ENCOUNTER — Telehealth (INDEPENDENT_AMBULATORY_CARE_PROVIDER_SITE_OTHER): Payer: Self-pay | Admitting: Internal Medicine

## 2023-11-17 VITALS — BP 138/85 | HR 75 | Temp 98.3°F | Resp 18

## 2023-11-17 DIAGNOSIS — E2749 Other adrenocortical insufficiency: Secondary | ICD-10-CM

## 2023-11-17 DIAGNOSIS — R7989 Other specified abnormal findings of blood chemistry: Secondary | ICD-10-CM | POA: Insufficient documentation

## 2023-11-17 LAB — ACTH STIMULATION, 30 MIN CORTISOL: ACTH STIM CORTISOL 30 MIN: 7.9 ug/dL

## 2023-11-17 LAB — ACTH STIMULATION, 60 MIN CORTISOL: ACTH STIM CORTISOL 60 MIN: 9.3 ug/dL

## 2023-11-17 LAB — ACTH STIMULATION, 0 MIN CORTISOL: ACTH STIM CORTISOL 0 MIN: 1.4 ug/dL

## 2023-11-17 MED ORDER — COSYNTROPIN 0.25 MG SOLUTION FOR INJECTION
0.2500 mg | Freq: Once | INTRAMUSCULAR | Status: AC
Start: 2023-11-17 — End: 2023-11-17
  Administered 2023-11-17: 0.25 mg via INTRAVENOUS
  Filled 2023-11-17: qty 5

## 2023-11-17 MED ORDER — COSYNTROPIN 0.25 MG/ML FOR IM INJECTION
0.2500 mg | Freq: Once | INTRAMUSCULAR | Status: DC
Start: 2023-11-17 — End: 2023-11-17
  Filled 2023-11-17: qty 1

## 2023-11-17 MED ORDER — HYDROCORTISONE 10 MG TABLET
ORAL_TABLET | ORAL | 3 refills | Status: AC
Start: 2023-11-17 — End: ?

## 2023-11-17 NOTE — Nurses Notes (Signed)
 Physician available during treatment. Pt tolerated treatment well. No needs or concerns expressed.

## 2023-11-17 NOTE — Telephone Encounter (Signed)
 Acth stim test is abnormal    MRI sella with and without contrast  Start hydrocortisone 15 mg at 8 in the morning and 10 mg at 5 in the evening  Please set up an office visit to discuss lab results      Please notify patient

## 2023-11-17 NOTE — Telephone Encounter (Signed)
 Patient notified via MyChart.    Weber Cooks, RN

## 2023-11-20 LAB — ADRENOCORTICOTROPIC HORMONE: ACTH: <5 pg/mL — ABNORMAL LOW (ref 7.7–77.0)

## 2023-11-23 ENCOUNTER — Encounter (INDEPENDENT_AMBULATORY_CARE_PROVIDER_SITE_OTHER): Payer: Self-pay

## 2023-11-23 ENCOUNTER — Telehealth (INDEPENDENT_AMBULATORY_CARE_PROVIDER_SITE_OTHER): Payer: Self-pay | Admitting: Internal Medicine

## 2023-11-23 NOTE — Telephone Encounter (Signed)
 Pt. Left lengthy message with questions and concerns about hydrocortisone side effects.  He is worried if it suppresses the immune system what will happen regarding his HIV med action.  He also worries if it makes you more susceptible to infection will it reactivate MRSA that he had really bad in the past.  He also wants to know what you are looking for from the MRI that's coming up.  Please advise.    Montie Gash, RN

## 2023-11-23 NOTE — Telephone Encounter (Signed)
 It is extremely important that the patient takes hydrocortisone as he is not making enough stress hormone called cortisol in the body  Cortisol is required for everyday survival as well as stressful scenarios such as fighting off infections, surgeries, accidents, common illnesses, hospitalizations.      MRI is meant to assess the pituitary gland

## 2023-11-24 ENCOUNTER — Encounter (INDEPENDENT_AMBULATORY_CARE_PROVIDER_SITE_OTHER): Payer: Self-pay

## 2023-11-28 ENCOUNTER — Telehealth (HOSPITAL_BASED_OUTPATIENT_CLINIC_OR_DEPARTMENT_OTHER): Payer: Self-pay | Admitting: HEMATOLOGY/ONCOLOGY

## 2023-11-28 NOTE — Nursing Note (Signed)
 Surgery clearance completed by Dr Lunette and faxed to dept of neurosurgery at [facility].

## 2023-12-05 ENCOUNTER — Other Ambulatory Visit: Payer: Self-pay

## 2023-12-05 NOTE — Progress Notes (Cosign Needed)
 [facility] SURGICAL ONCOLOGY   RETURN PATIENT EVALUATION    Patient: [name]  D.O.B.: 28-Dec-1945  MRN# [id number]  Date of Service: 12/06/2023    PCP: [facility]    Subjective:  [name] is a 78 y.o. 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   The patient noted that the 3 small pancreatic masses in the distal tail was first discovered incidentally post op of his colectomy back in 2006. The patient noted years later (2017) for the treatment of his prostate cancer that his pancreatic mass showed that mass grew into a single 7 cm mass that was found incidently CTAP.  Therefore, he underwent the above procedure.      Today, he presents with a MRI MRCP. He denies any abdominal pain, n/v, constipation, diarrhea, steatorrhea, or jaundice. At his visit on 06/08/22 EUS was discussed but he opted for surveillance MRI/MRCP given what he had been through and his wife's current medical problems.     All other systems reviewed are negative, except those listed above. The patient denies any changes to his medical, surgical, family or social history since his visit on 12/07/2022.    Objective:  Vitals are BP 129/78   Pulse 96   Temp 36 C (96.8 F) (Temporal)   Ht 1.778 m (5' 10)   Wt 75.2 kg (165 lb 12.6 oz)   SpO2 99%   BMI 23.79 kg/m     On examination the patient is pleasant, in NAD. Trachea is midline. Heart is RRR without murmurs. Respirations are unlabored and lungs are CTA. No peripheral edema or cyanosis. Abdomen is soft, non-tender and non-distended.Normal muscle strength and tone of all extremities. Skin has no rashes or jaundice. He is alert and oriented without focal motor or sensory deficits. Speech pattern and mood/affect normal.    Data Reviewed:  12/06/23 MRI/MRCP does not reveal any significant change.        Assessment and Plan:  [name] is a 78 y.o. male who is s/p robotic distal pancreatectomy and splenectomy  on 03/23/21 for an IPMN.   The patient noted that the 3 small pancreatic masses in the distal tail was first discovered incidentally post op of his colectomy back in 2006. The patient noted years later (2017) for the treatment of his prostate cancer that his pancreatic mass showed that mass grew into a single 7 cm mass that was found incidently CTAP.  Therefore, he underwent the above procedure. 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     - Return to clinic in 1 year with repeat MRI/MRCP for surveillance.     Peyton GEANNIE Ni, APRN, FNP-BC  Division of Surgical Oncology  [facility]  12/05/2023 14:54

## 2023-12-06 ENCOUNTER — Ambulatory Visit: Payer: Self-pay | Attending: SURGICAL ONCOLOGY | Admitting: SURGICAL ONCOLOGY

## 2023-12-06 ENCOUNTER — Encounter (INDEPENDENT_AMBULATORY_CARE_PROVIDER_SITE_OTHER): Payer: Self-pay | Admitting: SURGICAL ONCOLOGY

## 2023-12-06 ENCOUNTER — Ambulatory Visit (HOSPITAL_BASED_OUTPATIENT_CLINIC_OR_DEPARTMENT_OTHER)
Admission: RE | Admit: 2023-12-06 | Discharge: 2023-12-06 | Disposition: A | Discharge status: Home / Self Care | Payer: Self-pay | Source: Ambulatory Visit

## 2023-12-06 VITALS — BP 129/78 | HR 96 | Temp 96.8°F | Ht 70.0 in | Wt 165.8 lb

## 2023-12-06 DIAGNOSIS — Z90411 Acquired partial absence of pancreas: Secondary | ICD-10-CM | POA: Insufficient documentation

## 2023-12-06 DIAGNOSIS — Z09 Encounter for follow-up examination after completed treatment for conditions other than malignant neoplasm: Secondary | ICD-10-CM | POA: Insufficient documentation

## 2023-12-06 DIAGNOSIS — D490 Neoplasm of unspecified behavior of digestive system: Secondary | ICD-10-CM

## 2023-12-06 DIAGNOSIS — Z86018 Personal history of other benign neoplasm: Secondary | ICD-10-CM | POA: Insufficient documentation

## 2023-12-06 DIAGNOSIS — K862 Cyst of pancreas: Secondary | ICD-10-CM | POA: Insufficient documentation

## 2023-12-06 DIAGNOSIS — Z9049 Acquired absence of other specified parts of digestive tract: Secondary | ICD-10-CM | POA: Insufficient documentation

## 2023-12-06 DIAGNOSIS — D136 Benign neoplasm of pancreas: Secondary | ICD-10-CM

## 2023-12-06 DIAGNOSIS — Z9081 Acquired absence of spleen: Secondary | ICD-10-CM | POA: Insufficient documentation

## 2023-12-06 MED ORDER — GADOPICLENOL 0.5 MMOL/ML INTRAVENOUS SOLUTION
7.0000 mL | INTRAVENOUS | Status: AC
Start: 2023-12-06 — End: 2023-12-06
  Administered 2023-12-06: 7 mL via INTRAVENOUS

## 2023-12-06 NOTE — Nurses Notes (Signed)
 Patient arrived for MRI. Patient changed into appropriate MRI attire. PIV established.  Myrtice Lauth, RN

## 2023-12-07 ENCOUNTER — Encounter (HOSPITAL_COMMUNITY): Payer: Self-pay

## 2023-12-13 ENCOUNTER — Ambulatory Visit
Admission: RE | Admit: 2023-12-13 | Discharge: 2023-12-13 | Disposition: A | Discharge status: Home / Self Care | Payer: Self-pay | Source: Ambulatory Visit | Attending: Internal Medicine | Admitting: Internal Medicine

## 2023-12-13 ENCOUNTER — Other Ambulatory Visit: Payer: Self-pay

## 2023-12-13 DIAGNOSIS — I6782 Cerebral ischemia: Secondary | ICD-10-CM

## 2023-12-13 DIAGNOSIS — E2749 Other adrenocortical insufficiency: Secondary | ICD-10-CM | POA: Insufficient documentation

## 2023-12-13 MED ORDER — GADOPICLENOL 0.5 MMOL/ML INTRAVENOUS SOLUTION
7.0000 mL | INTRAVENOUS | Status: AC
Start: 2023-12-13 — End: 2023-12-13
  Administered 2023-12-13: 7 mL via INTRAVENOUS

## 2023-12-14 ENCOUNTER — Telehealth (INDEPENDENT_AMBULATORY_CARE_PROVIDER_SITE_OTHER): Payer: Self-pay | Admitting: Internal Medicine

## 2023-12-14 DIAGNOSIS — E2749 Other adrenocortical insufficiency: Secondary | ICD-10-CM

## 2023-12-14 NOTE — Telephone Encounter (Signed)
 Reviewed MRI sella        Recommendations  6 mm pituitary adenoma present  This is unlikely the cause of adrenal insufficiency as it is <1 cm  This is likely an incidentaloma.    There is also evidence of an old stroke.  This has remained unchanged since 2023    - prolactin, TSH, free T4  - IGF    - will discuss more in detail during next office visit      Please order above and notify patient

## 2023-12-19 ENCOUNTER — Encounter (INDEPENDENT_AMBULATORY_CARE_PROVIDER_SITE_OTHER): Payer: Self-pay

## 2023-12-19 NOTE — Telephone Encounter (Signed)
 Patient notified via MyChart.  Orders placed.    Maryruth Hancock, RN

## 2023-12-20 ENCOUNTER — Ambulatory Visit

## 2023-12-20 ENCOUNTER — Other Ambulatory Visit: Payer: Self-pay

## 2023-12-20 DIAGNOSIS — E2749 Other adrenocortical insufficiency: Secondary | ICD-10-CM | POA: Insufficient documentation

## 2023-12-20 LAB — PROLACTIN: PROLACTIN: 13.2 ng/mL (ref 3.5–19.4)

## 2023-12-20 LAB — THYROXINE, FREE (FREE T4): THYROXINE (T4), FREE: 0.83 ng/dL (ref 0.70–1.48)

## 2023-12-20 LAB — THYROID STIMULATING HORMONE (SENSITIVE TSH): TSH: 1.555 u[IU]/mL (ref 0.350–4.940)

## 2023-12-21 ENCOUNTER — Telehealth (INDEPENDENT_AMBULATORY_CARE_PROVIDER_SITE_OTHER): Payer: Self-pay | Admitting: Internal Medicine

## 2023-12-21 NOTE — Telephone Encounter (Signed)
 Thyroid  function and prolactin acceptable  IGF-1 levels pending    Please notify patient

## 2023-12-25 ENCOUNTER — Encounter (INDEPENDENT_AMBULATORY_CARE_PROVIDER_SITE_OTHER): Payer: Self-pay

## 2023-12-27 LAB — INSULIN-LIKE GROWTH FACTOR 1, SERUM
IGF-1, LCMS: 181 ng/mL (ref 34–245)
Z-SCORE (MALE): 1.3 {STDV} (ref ?–2.0)

## 2024-01-29 ENCOUNTER — Other Ambulatory Visit (INDEPENDENT_AMBULATORY_CARE_PROVIDER_SITE_OTHER): Payer: Self-pay | Admitting: INFECTIOUS DISEASE

## 2024-01-29 DIAGNOSIS — B20 Human immunodeficiency virus [HIV] disease: Secondary | ICD-10-CM

## 2024-01-29 MED ORDER — GENVOYA 150 MG-150 MG-200 MG-10 MG TABLET: 1.0000 | ORAL_TABLET | Freq: Every day | ORAL | 0 refills | Status: DC

## 2024-01-29 NOTE — Telephone Encounter (Signed)
 Last scheduled appointment with the encounter provider was 05/19/2022.  Currently scheduled next future appointment with the encounter provider is 02/19/2024  The next office visit in this department is 02/19/2024    Grayce Clarity, RN  01/29/2024, 13:46

## 2024-02-05 ENCOUNTER — Other Ambulatory Visit: Payer: Self-pay

## 2024-02-05 ENCOUNTER — Ambulatory Visit: Attending: Family Medicine

## 2024-02-05 ENCOUNTER — Other Ambulatory Visit (INDEPENDENT_AMBULATORY_CARE_PROVIDER_SITE_OTHER): Payer: Self-pay | Admitting: PHYSICIAN ASSISTANT

## 2024-02-05 DIAGNOSIS — Z79899 Other long term (current) drug therapy: Secondary | ICD-10-CM | POA: Insufficient documentation

## 2024-02-05 DIAGNOSIS — B20 Human immunodeficiency virus [HIV] disease: Secondary | ICD-10-CM

## 2024-02-05 LAB — COMPREHENSIVE METABOLIC PANEL, NON-FASTING
ALBUMIN: 4.3 g/dL (ref 3.5–5.0)
ALKALINE PHOSPHATASE: 73 U/L (ref 38–126)
ALT (SGPT): 46 U/L (ref <=50)
ANION GAP: 7 mmol/L
AST (SGOT): 27 U/L (ref 17–59)
BILIRUBIN TOTAL: 0.3 mg/dL (ref 0.2–1.3)
BUN/CREA RATIO: 18
BUN: 18 mg/dL (ref 9–20)
CALCIUM: 9.9 mg/dL (ref 8.4–10.2)
CHLORIDE: 102 mmol/L (ref 98–107)
CO2 TOTAL: 31 mmol/L — ABNORMAL HIGH (ref 22–30)
CREATININE: 1 mg/dL (ref 0.66–1.25)
ESTIMATED GFR: 77 mL/min/1.73mˆ2 (ref >=60)
GLUCOSE: 192 mg/dL — ABNORMAL HIGH (ref 74–106)
POTASSIUM: 3.7 mmol/L (ref 3.5–5.1)
PROTEIN TOTAL: 7.2 g/dL (ref 6.3–8.2)
SODIUM: 140 mmol/L (ref 137–145)

## 2024-02-05 LAB — CBC WITH DIFF
BASOPHIL #: <0.1 x10ˆ3/uL (ref <=0.20)
BASOPHIL %: 0.6 %
EOSINOPHIL #: <0.1 x10ˆ3/uL (ref <=0.50)
EOSINOPHIL %: 0.6 %
HCT: 48.7 % (ref 38.9–52.0)
HGB: 16 g/dL (ref 13.4–17.5)
IMMATURE GRANULOCYTE #: <0.1 x10ˆ3/uL (ref <0.10)
LYMPHOCYTE #: 4.68 x10ˆ3/uL (ref 1.00–4.80)
MCH: 32.3 pg — ABNORMAL HIGH (ref 26.0–32.0)
MCHC: 32.9 g/dL (ref 31.0–35.5)
MCV: 98.2 fL (ref 78.0–100.0)
MONOCYTE #: 1.2 x10ˆ3/uL — ABNORMAL HIGH (ref 0.20–1.10)
MONOCYTE %: 9.5 %
MPV: 11.3 fL (ref 8.7–12.5)
NEUTROPHIL #: 6.48 x10ˆ3/uL (ref 1.50–7.70)
NEUTROPHIL %: 51.5 %
PLATELETS: 226 x10ˆ3/uL (ref 150–400)
RBC: 4.96 x10ˆ6/uL (ref 4.50–6.10)
RDW-CV: 14.3 % (ref 11.5–15.5)
WBC: 12.6 x10ˆ3/uL — ABNORMAL HIGH (ref 3.7–11.0)

## 2024-02-05 LAB — LIPID PANEL
CHOLESTEROL: 216 mg/dL — ABNORMAL HIGH (ref 0–199)
HDL CHOL: 47 mg/dL (ref 40–60)
LDL CALC: 102 mg/dL (ref 0–130)
TRIGLYCERIDES: 336 mg/dL — ABNORMAL HIGH (ref 0–149)

## 2024-02-05 MED ORDER — GENVOYA 150 MG-150 MG-200 MG-10 MG TABLET: 1.0000 | ORAL_TABLET | Freq: Every day | ORAL | 1 refills | Status: DC

## 2024-02-05 NOTE — Ancillary Notes (Signed)
 [facility] [place]    Venipuncture performed in office on left arm antecubital vein, dry pressure dressing was applied to site and patient tolerated it well.  Specimen was centrifuged, aliquoted as needed and specimen was labeled and packaged for transport.    Georgian Monte, PHLEBOTOMIST  02/05/2024, 10:35

## 2024-02-05 NOTE — Telephone Encounter (Signed)
 Last scheduled appointment with the encounter provider was 08/09/2023.  Currently scheduled next future appointment with the encounter provider is 02/19/24  The next office visit in this department is 02/19/2024    Grayce Clarity, RN  02/05/2024, 14:22

## 2024-02-05 NOTE — Telephone Encounter (Signed)
 Called and spoke with Lyn[place] about this medication being sent to the pharmacy for refill.   Oren voiced understanding.     Grayce Clarity, RN

## 2024-02-05 NOTE — Ancillary Notes (Signed)
 [employer] [facility] [place]    Venipuncture performed in office on right arm antecubital vein, dry pressure dressing was applied to site and patient tolerated it well.  Specimen was centrifuged, aliquoted as needed and specimen was labeled and packaged for transport.    Georgian Monte, PHLEBOTOMIST  02/05/2024, 10:35

## 2024-02-07 LAB — CD4/CD8
CD3 # (T CELL): 3204 {cells}/uL — ABNORMAL HIGH (ref 892–2436)
CD3 % (T CELL): 69 %
CD4 # (HELPER T CELL): 1293 {cells}/uL (ref 382–1614)
CD4 % (HELPER T CELL): 28 %
CD4:CD8 RATIO: 0.6 — ABNORMAL LOW (ref 1.0–3.4)
CD8 # (SUPPRESSOR T CELL): 2010 {cells}/uL — ABNORMAL HIGH (ref 157–813)
CD8 % (SUPPRESSOR T CELL): 43 %

## 2024-02-18 ENCOUNTER — Other Ambulatory Visit: Payer: Self-pay

## 2024-02-19 ENCOUNTER — Encounter (INDEPENDENT_AMBULATORY_CARE_PROVIDER_SITE_OTHER): Payer: Self-pay | Admitting: PHYSICIAN ASSISTANT

## 2024-02-19 ENCOUNTER — Ambulatory Visit: Payer: Self-pay | Attending: INFECTIOUS DISEASE | Admitting: PHYSICIAN ASSISTANT

## 2024-02-19 VITALS — BP 118/70 | HR 84 | Temp 97.6°F | Ht 70.0 in | Wt 172.0 lb

## 2024-02-19 DIAGNOSIS — Z8546 Personal history of malignant neoplasm of prostate: Secondary | ICD-10-CM | POA: Insufficient documentation

## 2024-02-19 DIAGNOSIS — Z79899 Other long term (current) drug therapy: Secondary | ICD-10-CM

## 2024-02-19 DIAGNOSIS — B20 Human immunodeficiency virus [HIV] disease: Secondary | ICD-10-CM | POA: Insufficient documentation

## 2024-02-19 MED ORDER — VALACYCLOVIR 1 GRAM TABLET: 1000.0000 mg | ORAL_TABLET | Freq: Two times a day (BID) | ORAL | 0 refills | Status: AC

## 2024-02-19 MED ORDER — GENVOYA 150 MG-150 MG-200 MG-10 MG TABLET: 1.0000 | ORAL_TABLET | Freq: Every day | ORAL | 1 refills | Status: AC

## 2024-02-19 NOTE — Progress Notes (Signed)
 [name]  [name]       Return Patient Visit       CHIEF COMPLAINT: Follow Up (Follow up visit for HIV/HSV. Patient is currently taking Genvoya , will need refills today. Updated lab work was completed 02/05/24 for review. /)       There are no exam notes on file for this visit.       HPI: [name] is a 79 y.o. male who follows up with outpatient infectious disease for the diagnosis of HIV. HIV infection History of MRSA bacteremia secondary skin and soft tissue infection. Patient is on Genvoya , stable HIV VL and CD4 count. 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 He gets back and hip injections. 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 Follows with [name] at [facility].     PCP: Dr. Woodie [name]. On Lipitor. 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     REVIEW OF SYSTEMS:  Negative except as noted in HPI    MEDICATIONS:  Current Outpatient Medications   Medication Sig    acetaminophen  (TYLENOL ) 325 mg Oral Tablet Take 2 Tablets (650 mg total) by mouth Every 6 hours as needed for Pain    amLODIPine  (NORVASC ) 10 mg Oral Tablet Take 1 Tablet (10 mg total) by mouth Daily    atorvastatin  (LIPITOR) 40 mg Oral Tablet Take 1 Tablet (40 mg total) by mouth Daily    Blood-Glucose Sensor (FREESTYLE LIBRE 3 PLUS SENSOR) Does not apply Device Replace Every 15 days    buprenorphine 5 mcg/hour Transdermal Patch Weekly Place 1 Patch on the skin Every 7 days 7.5 mg    cyanocobalamin  (VITAMIN B 12) 1,000 mcg Oral Tablet Take 1 Tablet (1,000 mcg total) by mouth Every morning    docusate sodium  (COLACE) 100 mg Oral Capsule Take 1 Capsule (100 mg total) by mouth Twice daily    elvitegravir-cobicistat-emtricitrabine-tenofovir alafen (GENVOYA ) 150-150-200-10 mg Oral Tablet Take 1 Tablet by mouth Once a day for 180 days    ferrous sulfate  (FEOSOL) 325 mg (65 mg iron ) Oral Tablet Take 1 Tablet (325 mg  total) by mouth Daily    gabapentin  (NEURONTIN ) 100 mg Oral Capsule Take 3 Capsules (300 mg total) by mouth Three times a day For shingle pain    glipiZIDE  (GLUCOTROL ) 5 mg Oral Tablet 5 mg twice daily    hydrocortisone  (CORTEF ) 10 mg Oral Tablet 15 mg at 8 AM and 10 mg at 5 PM. Allow dose increases for sick days    levothyroxine  (SYNTHROID ) 88 mcg Oral Tablet Take 1 Tablet (88 mcg total) by mouth Every morning    melatonin 3 mg Oral Tablet Take 10 mg by mouth Every night    MULTIVITAMIN ORAL Take 1 Tablet by mouth Once a day    pantoprazole  (PROTONIX ) 40 mg Oral Tablet, Delayed Release (E.C.) Take 1 Tablet (40 mg total) by mouth Daily    sennosides-docusate sodium  (SENOKOT-S) 8.6-50 mg Oral Tablet Take 1 Tablet by mouth Every evening    valACYclovir  (VALTREX ) 1 gram Oral Tablet Take 1 Tablet (1 g total) by mouth Twice daily for 7 days       PHYSICAL EXAM:  Constitutional: Patient appears stated age.  Eyes: No scleral icterus.   Neck: Neck supple.   Cardiovascular: Normal rate, regular rhythm and normal heart sounds.    Pulmonary/Chest: Effort normal and breath sounds normal. No wheezes. Has no rhonchi. Has no rales.   Abdominal:  Soft. Bowel sounds are normal. No distension. There is no tenderness.   Musculoskeletal: Exhibits no edema, tenderness or deformity.   Lymphadenopathy: No enlarged lymph nodes or tenderness present.  Skin: Skin is warm and dry. No rash noted. No erythema.          VITALS:  Vitals:    02/19/24 0858   BP: 118/70   Pulse: 84   Temp: 36.4 C (97.6 F)   TempSrc: Oral   SpO2: 96%   Weight: 78 kg (172 lb)   Height: 1.778 m (5' 10)   BMI: 24.68        HIV VIRAL LOAD    HIV1 RNA VIRAL LOAD   Date Value Ref Range Status   02/05/2024 Target Not Detected Target Not Detected Final       CD4 COUNT CD4 # (HELPER T CELL)   Date Value Ref Range Status   02/05/2024 1,293 382 - 1,614 cells/uL Final       TB GOLD Recent Results (from the past 8765 hours)   QUANTIFERON    Collection Time: 08/02/23  3:08 PM    Result Value Ref Range    QFT QUALITATIVE Negative Negative     Comment: M. tuberculosis infection NOT likely.  A negative result alone does not exclude infection with M. tuberculosis.      QFT NIL  0.0751 IU/mL     Comment: Nil results are internal negative controls, and should be very low.    QFT MITOGEN >10.00 IU/mL     Comment: Mitogen results are internal positive controls and should be the highest result generated in a set (max, 10.00 units).    QFT TB1 0.08 IU/mL     Comment: The TB tubes represent the reactivity to TB antigens; comparison with Nil and Mitogen results aids in the determination of the qualitative interpretation given.    QFT TB2 0.08 IU/mL     Comment: The TB tubes represent the reactivity to TB antigens; comparison with Nil and Mitogen results aids in the determination of the qualitative interpretation given.       LIPID Lab Results   Component Value Date    CHOLESTEROL 216 (H) 02/05/2024    HDLCHOL 47 02/05/2024    LDLCHOL 102 02/05/2024    LDLCHOLDIR 66 08/02/2023    TRIG 336 (H) 02/05/2024        CMP COMPREHENSIVE METABOLIC PANEL   Lab Results   Component Value Date    SODIUM 140 02/05/2024    POTASSIUM 3.7 02/05/2024    CHLORIDE 102 02/05/2024    CO2 31 (H) 02/05/2024    ANIONGAP 7 02/05/2024    BUN 18 02/05/2024    CREATININE 1.00 02/05/2024    GLUCOSENF 192 (H) 02/05/2024    CALCIUM  9.9 02/05/2024    PHOSPHORUS 3.2 05/05/2022    ALBUMIN  4.3 02/05/2024    TOTALPROTEIN 7.2 02/05/2024    ALKPHOS 73 02/05/2024    AST 27 02/05/2024    ALT 46 02/05/2024    BILIRUBINCON 0.1 06/24/2021                RPR   RPR QUALITATIVE   Date Value Ref Range Status   08/02/2023 Nonreactive Nonreactive Final      No results found for this or any previous visit (from the past 8765 hours).        Health Maintenance Due   Topic Date Due    Diabetic Retinal Exam  Never done    Osteoporosis screening  Never done    Tetanus-Diptheria Vaccines (1 - Tdap) Never done    Hepatitis A Vaccine (1 of 2 - Risk 2-dose  series) Never done    Medicare Annual Wellness Visit  Never done    RSV Adult 60+ or Pregnancy (1 - 1-dose 75+ series) Never done    Meningococcal B Vaccine (2 of 4 - Increased Risk Bexsero  3-dose series) 04/01/2021    Meningococcal Vaccine (2 - Risk 2-dose series) 04/29/2021    Pneumococcal Vaccination, Age 77+ (2 of 2 - PPSV23, PCV20, or PCV21) 03/04/2022    Covid-19 Vaccine (Shared decision making) (7 - 2025-26 season) 10/02/2023    Diabetic A1C  02/10/2024        Immunization History   Administered Date(s) Administered    COVID-19 VACCINE,MODERNA(SPIKEVAX),50MCG/0.5ML,12 YR+,SEASONAL 11/09/2021    Covid-19 Vaccine,Moderna Bivalent 01/07/2021    Covid-19 Vaccine,Moderna,12 Years+ 03/02/2019, 03/30/2019, 11/27/2019    Covid-19 Vaccine,Pfizer(Comirnaty),20mcg/0.3ml,12 yr+,Seasonal 11/24/2022    HAEMOPHILUS B CONJUGATE VACCINE  03/04/2021    HEPATITIS B VIRUS RECOMB VACCINE 10/11/2013, 11/18/2013, 02/18/2014    High-Dose Influenza Vaccine, 65+ (FLUZONE HD) 11/06/2018, 10/29/2021    Influenza Vaccine, 6 month-adult 11/18/2013, 10/27/2014, 10/16/2015, 10/17/2016, 11/03/2017    Meningococcal B Vaccine - 2 Dose 03/04/2021    Meningococcal Vaccine 03/04/2021    PREVNAR 13 12/14/2012, 01/14/2014    Shingrix - Zoster Vaccine 02/27/2018    VAXNEUVANCE  03/04/2021                     ASSESSMENT/PLAN    ICD-10-CM    1. Human immunodeficiency virus (HIV) disease (CMS HCC)  B20 elvitegravir-cobicistat-emtricitrabine-tenofovir alafen (GENVOYA ) 150-150-200-10 mg Oral Tablet     CBC/DIFF     COMPREHENSIVE METABOLIC PANEL, NON-FASTING     HIV-1 RNA QUANTITATIVE PCR, PLASMA     CD4/CD8     QUANTIFERON TB GOLD PLUS, BLOOD     SYPHILIS, RAPID PLASMIN REAGIN (RPR), WITH TITER REFLEX     LIPID PANEL      2. History of prostate cancer  Z85.46 PSA SCREENING      On Genvoya , tolerating well. [facility]  Triglycerides are elevated, patient s/p partial pancreatectomy, follows with local endocrinology  HIV VL not  detected, CD4 count within normal limits.   White blood cell count slightly elevated 12, secondary to splenectomy.  Will send Rx for Valtrex , patient gets 1-2 outbreaks per year and will treat with Valtrex  during those times.   Repeat labs in 6 months  Return to clinic 6 months         PATIENT INSTRUCTIONS:  There are no Patient Instructions on file for this visit.    FOLLOW UP  Return in about 6 months (around 08/18/2024) for follow-up HIV.    Darice Kussmaul, PA

## 2024-05-08 ENCOUNTER — Ambulatory Visit (HOSPITAL_BASED_OUTPATIENT_CLINIC_OR_DEPARTMENT_OTHER): Admitting: Radiology

## 2024-05-15 ENCOUNTER — Ambulatory Visit (INDEPENDENT_AMBULATORY_CARE_PROVIDER_SITE_OTHER): Payer: Self-pay | Admitting: Internal Medicine

## 2024-08-19 ENCOUNTER — Ambulatory Visit (INDEPENDENT_AMBULATORY_CARE_PROVIDER_SITE_OTHER): Payer: Self-pay | Admitting: PHYSICIAN ASSISTANT

## 2024-08-21 ENCOUNTER — Ambulatory Visit (HOSPITAL_BASED_OUTPATIENT_CLINIC_OR_DEPARTMENT_OTHER): Payer: Self-pay | Admitting: Nurse Practitioner

## 2024-12-06 ENCOUNTER — Ambulatory Visit (INDEPENDENT_AMBULATORY_CARE_PROVIDER_SITE_OTHER): Payer: Self-pay

## 2024-12-11 ENCOUNTER — Ambulatory Visit (INDEPENDENT_AMBULATORY_CARE_PROVIDER_SITE_OTHER): Payer: Self-pay | Admitting: SURGICAL ONCOLOGY

## 2024-12-11 ENCOUNTER — Ambulatory Visit (HOSPITAL_BASED_OUTPATIENT_CLINIC_OR_DEPARTMENT_OTHER)
